# Patient Record
Sex: Female | Born: 1970
Health system: Southern US, Community
[De-identification: ages and names within clinical notes are randomized; demographics above are authoritative.]

## PROBLEM LIST (undated history)

## (undated) DIAGNOSIS — N800 Endometriosis of uterus: Secondary | ICD-10-CM

## (undated) DIAGNOSIS — N920 Excessive and frequent menstruation with regular cycle: Secondary | ICD-10-CM

## (undated) DIAGNOSIS — J069 Acute upper respiratory infection, unspecified: Secondary | ICD-10-CM

## (undated) DIAGNOSIS — K219 Gastro-esophageal reflux disease without esophagitis: Secondary | ICD-10-CM

## (undated) DIAGNOSIS — I00 Rheumatic fever without heart involvement: Secondary | ICD-10-CM

## (undated) DIAGNOSIS — E739 Lactose intolerance, unspecified: Secondary | ICD-10-CM

## (undated) DIAGNOSIS — Z87442 Personal history of urinary calculi: Secondary | ICD-10-CM

## (undated) DIAGNOSIS — K5909 Other constipation: Secondary | ICD-10-CM

## (undated) DIAGNOSIS — N8003 Adenomyosis of the uterus: Secondary | ICD-10-CM

## (undated) DIAGNOSIS — N3010 Interstitial cystitis (chronic) without hematuria: Secondary | ICD-10-CM

## (undated) DIAGNOSIS — G4730 Sleep apnea, unspecified: Secondary | ICD-10-CM

## (undated) DIAGNOSIS — N809 Endometriosis, unspecified: Secondary | ICD-10-CM

## (undated) DIAGNOSIS — Z9889 Other specified postprocedural states: Secondary | ICD-10-CM

## (undated) DIAGNOSIS — J45909 Unspecified asthma, uncomplicated: Secondary | ICD-10-CM

## (undated) DIAGNOSIS — R55 Syncope and collapse: Secondary | ICD-10-CM

## (undated) DIAGNOSIS — D649 Anemia, unspecified: Secondary | ICD-10-CM

## (undated) DIAGNOSIS — N946 Dysmenorrhea, unspecified: Secondary | ICD-10-CM

## (undated) DIAGNOSIS — I82409 Acute embolism and thrombosis of unspecified deep veins of unspecified lower extremity: Secondary | ICD-10-CM

## (undated) DIAGNOSIS — G43909 Migraine, unspecified, not intractable, without status migrainosus: Secondary | ICD-10-CM

## (undated) DIAGNOSIS — Z86018 Personal history of other benign neoplasm: Secondary | ICD-10-CM

## (undated) DIAGNOSIS — D219 Benign neoplasm of connective and other soft tissue, unspecified: Secondary | ICD-10-CM

## (undated) DIAGNOSIS — R197 Diarrhea, unspecified: Secondary | ICD-10-CM

## (undated) DIAGNOSIS — M549 Dorsalgia, unspecified: Secondary | ICD-10-CM

## (undated) DIAGNOSIS — K222 Esophageal obstruction: Secondary | ICD-10-CM

## (undated) DIAGNOSIS — N200 Calculus of kidney: Secondary | ICD-10-CM

## (undated) DIAGNOSIS — D75A Glucose-6-phosphate dehydrogenase (G6PD) deficiency without anemia: Secondary | ICD-10-CM

## (undated) DIAGNOSIS — M1990 Unspecified osteoarthritis, unspecified site: Secondary | ICD-10-CM

## (undated) DIAGNOSIS — K449 Diaphragmatic hernia without obstruction or gangrene: Secondary | ICD-10-CM

## (undated) DIAGNOSIS — K589 Irritable bowel syndrome without diarrhea: Secondary | ICD-10-CM

## (undated) DIAGNOSIS — N6089 Other benign mammary dysplasias of unspecified breast: Secondary | ICD-10-CM

## (undated) HISTORY — DX: Other benign mammary dysplasias of unspecified breast: N60.89

## (undated) HISTORY — PX: KNEE SURGERY: SHX244

## (undated) HISTORY — DX: Esophageal obstruction: K22.2

## (undated) HISTORY — DX: Anemia, unspecified: D64.9

## (undated) HISTORY — DX: Diarrhea, unspecified: R19.7

## (undated) HISTORY — DX: Unspecified asthma, uncomplicated: J45.909

## (undated) HISTORY — DX: Diaphragmatic hernia without obstruction or gangrene: K44.9

## (undated) HISTORY — DX: Adenomyosis of the uterus: N80.03

## (undated) HISTORY — DX: Personal history of other benign neoplasm: Z86.018

## (undated) HISTORY — DX: Glucose-6-phosphate dehydrogenase (G6PD) deficiency without anemia: D75.A

## (undated) HISTORY — PX: CHOLECYSTECTOMY: SHX55

## (undated) HISTORY — DX: Endometriosis, unspecified: N80.9

## (undated) HISTORY — DX: Sleep apnea, unspecified: G47.30

## (undated) HISTORY — PX: LAPAROSCOPY: SHX197

## (undated) HISTORY — DX: Excessive and frequent menstruation with regular cycle: N92.0

## (undated) HISTORY — DX: Endometriosis of uterus: N80.0

## (undated) HISTORY — DX: Dorsalgia, unspecified: M54.9

## (undated) HISTORY — DX: Interstitial cystitis (chronic) without hematuria: N30.10

## (undated) HISTORY — DX: Acute upper respiratory infection, unspecified: J06.9

## (undated) HISTORY — PX: ARTHROSCOPIC HAGLUNDS REPAIR: SHX5187

## (undated) HISTORY — DX: Benign neoplasm of connective and other soft tissue, unspecified: D21.9

## (undated) HISTORY — DX: Irritable bowel syndrome, unspecified: K58.9

## (undated) HISTORY — DX: Other constipation: K59.09

## (undated) HISTORY — DX: Unspecified osteoarthritis, unspecified site: M19.90

## (undated) HISTORY — DX: Lactose intolerance, unspecified: E73.9

## (undated) HISTORY — DX: Dysmenorrhea, unspecified: N94.6

---

## 1990-01-03 DIAGNOSIS — I82409 Acute embolism and thrombosis of unspecified deep veins of unspecified lower extremity: Secondary | ICD-10-CM | POA: Insufficient documentation

## 1997-10-23 ENCOUNTER — Other Ambulatory Visit: Admission: RE | Admit: 1997-10-23 | Discharge: 1997-10-23 | Payer: Self-pay | Admitting: Obstetrics and Gynecology

## 1998-01-05 ENCOUNTER — Encounter: Payer: Self-pay | Admitting: Emergency Medicine

## 1998-01-05 ENCOUNTER — Emergency Department (HOSPITAL_COMMUNITY): Admission: EM | Admit: 1998-01-05 | Discharge: 1998-01-05 | Payer: Self-pay | Admitting: Emergency Medicine

## 1999-03-01 ENCOUNTER — Encounter: Payer: Self-pay | Admitting: *Deleted

## 1999-03-01 ENCOUNTER — Ambulatory Visit (HOSPITAL_COMMUNITY): Admission: RE | Admit: 1999-03-01 | Discharge: 1999-03-01 | Payer: Self-pay | Admitting: *Deleted

## 1999-07-23 ENCOUNTER — Ambulatory Visit (HOSPITAL_COMMUNITY): Admission: RE | Admit: 1999-07-23 | Discharge: 1999-07-23 | Payer: Self-pay | Admitting: Emergency Medicine

## 2000-03-01 ENCOUNTER — Emergency Department (HOSPITAL_COMMUNITY): Admission: EM | Admit: 2000-03-01 | Discharge: 2000-03-01 | Payer: Self-pay | Admitting: Emergency Medicine

## 2000-04-22 ENCOUNTER — Encounter (INDEPENDENT_AMBULATORY_CARE_PROVIDER_SITE_OTHER): Payer: Self-pay | Admitting: Specialist

## 2000-04-22 ENCOUNTER — Encounter: Payer: Self-pay | Admitting: Emergency Medicine

## 2000-04-22 ENCOUNTER — Inpatient Hospital Stay (HOSPITAL_COMMUNITY): Admission: EM | Admit: 2000-04-22 | Discharge: 2000-04-25 | Payer: Self-pay | Admitting: Emergency Medicine

## 2000-04-23 ENCOUNTER — Encounter: Payer: Self-pay | Admitting: Emergency Medicine

## 2000-04-24 ENCOUNTER — Encounter: Payer: Self-pay | Admitting: Surgery

## 2000-09-09 ENCOUNTER — Encounter: Payer: Self-pay | Admitting: Emergency Medicine

## 2000-09-09 ENCOUNTER — Emergency Department (HOSPITAL_COMMUNITY): Admission: EM | Admit: 2000-09-09 | Discharge: 2000-09-09 | Payer: Self-pay | Admitting: Emergency Medicine

## 2000-09-13 ENCOUNTER — Encounter: Payer: Self-pay | Admitting: Emergency Medicine

## 2000-09-13 ENCOUNTER — Encounter: Admission: RE | Admit: 2000-09-13 | Discharge: 2000-09-13 | Payer: Self-pay | Admitting: Emergency Medicine

## 2000-10-17 ENCOUNTER — Ambulatory Visit (HOSPITAL_COMMUNITY): Admission: RE | Admit: 2000-10-17 | Discharge: 2000-10-17 | Payer: Self-pay | Admitting: Gastroenterology

## 2001-01-27 ENCOUNTER — Emergency Department (HOSPITAL_COMMUNITY): Admission: EM | Admit: 2001-01-27 | Discharge: 2001-01-27 | Payer: Self-pay | Admitting: *Deleted

## 2001-02-01 ENCOUNTER — Encounter: Payer: Self-pay | Admitting: Emergency Medicine

## 2001-02-01 ENCOUNTER — Encounter: Admission: RE | Admit: 2001-02-01 | Discharge: 2001-02-01 | Payer: Self-pay | Admitting: Emergency Medicine

## 2001-02-09 ENCOUNTER — Ambulatory Visit (HOSPITAL_COMMUNITY): Admission: RE | Admit: 2001-02-09 | Discharge: 2001-02-09 | Payer: Self-pay | Admitting: Gastroenterology

## 2001-02-12 ENCOUNTER — Other Ambulatory Visit: Admission: RE | Admit: 2001-02-12 | Discharge: 2001-02-12 | Payer: Self-pay | Admitting: Obstetrics and Gynecology

## 2001-02-14 ENCOUNTER — Ambulatory Visit (HOSPITAL_COMMUNITY): Admission: RE | Admit: 2001-02-14 | Discharge: 2001-02-14 | Payer: Self-pay | Admitting: Obstetrics and Gynecology

## 2001-02-14 ENCOUNTER — Encounter (INDEPENDENT_AMBULATORY_CARE_PROVIDER_SITE_OTHER): Payer: Self-pay | Admitting: Specialist

## 2001-09-06 ENCOUNTER — Encounter: Payer: Self-pay | Admitting: Emergency Medicine

## 2001-09-06 ENCOUNTER — Emergency Department (HOSPITAL_COMMUNITY): Admission: EM | Admit: 2001-09-06 | Discharge: 2001-09-06 | Payer: Self-pay | Admitting: Emergency Medicine

## 2001-09-08 ENCOUNTER — Emergency Department (HOSPITAL_COMMUNITY): Admission: EM | Admit: 2001-09-08 | Discharge: 2001-09-08 | Payer: Self-pay | Admitting: Emergency Medicine

## 2002-02-21 ENCOUNTER — Other Ambulatory Visit: Admission: RE | Admit: 2002-02-21 | Discharge: 2002-02-21 | Payer: Self-pay | Admitting: Obstetrics and Gynecology

## 2003-06-03 ENCOUNTER — Other Ambulatory Visit: Admission: RE | Admit: 2003-06-03 | Discharge: 2003-06-03 | Payer: Self-pay | Admitting: Obstetrics and Gynecology

## 2004-06-29 ENCOUNTER — Other Ambulatory Visit: Admission: RE | Admit: 2004-06-29 | Discharge: 2004-06-29 | Payer: Self-pay | Admitting: Obstetrics and Gynecology

## 2004-12-28 ENCOUNTER — Emergency Department (HOSPITAL_COMMUNITY): Admission: EM | Admit: 2004-12-28 | Discharge: 2004-12-28 | Payer: Self-pay | Admitting: Family Medicine

## 2005-10-04 ENCOUNTER — Other Ambulatory Visit: Admission: RE | Admit: 2005-10-04 | Discharge: 2005-10-04 | Payer: Self-pay | Admitting: Obstetrics and Gynecology

## 2006-08-22 ENCOUNTER — Encounter: Admission: RE | Admit: 2006-08-22 | Discharge: 2006-08-22 | Payer: Self-pay | Admitting: Emergency Medicine

## 2008-01-02 ENCOUNTER — Ambulatory Visit (HOSPITAL_COMMUNITY): Admission: RE | Admit: 2008-01-02 | Discharge: 2008-01-02 | Payer: Self-pay | Admitting: Emergency Medicine

## 2008-01-02 ENCOUNTER — Encounter (INDEPENDENT_AMBULATORY_CARE_PROVIDER_SITE_OTHER): Payer: Self-pay | Admitting: Emergency Medicine

## 2008-01-02 ENCOUNTER — Ambulatory Visit: Payer: Self-pay | Admitting: Vascular Surgery

## 2008-01-04 HISTORY — PX: VAGINAL HYSTERECTOMY: SUR661

## 2008-01-07 ENCOUNTER — Encounter: Admission: RE | Admit: 2008-01-07 | Discharge: 2008-01-07 | Payer: Self-pay | Admitting: Emergency Medicine

## 2008-04-01 ENCOUNTER — Ambulatory Visit (HOSPITAL_COMMUNITY): Admission: RE | Admit: 2008-04-01 | Discharge: 2008-04-02 | Payer: Self-pay | Admitting: Obstetrics and Gynecology

## 2008-04-01 ENCOUNTER — Encounter (INDEPENDENT_AMBULATORY_CARE_PROVIDER_SITE_OTHER): Payer: Self-pay | Admitting: Obstetrics and Gynecology

## 2009-06-14 ENCOUNTER — Emergency Department (HOSPITAL_COMMUNITY): Admission: EM | Admit: 2009-06-14 | Discharge: 2009-06-14 | Payer: Self-pay | Admitting: Emergency Medicine

## 2009-06-23 ENCOUNTER — Encounter: Admission: RE | Admit: 2009-06-23 | Discharge: 2009-06-23 | Payer: Self-pay | Admitting: Internal Medicine

## 2009-07-01 ENCOUNTER — Other Ambulatory Visit: Admission: RE | Admit: 2009-07-01 | Discharge: 2009-07-01 | Payer: Self-pay | Admitting: Diagnostic Radiology

## 2009-07-01 ENCOUNTER — Encounter: Admission: RE | Admit: 2009-07-01 | Discharge: 2009-07-01 | Payer: Self-pay | Admitting: Internal Medicine

## 2010-01-24 ENCOUNTER — Encounter: Payer: Self-pay | Admitting: Emergency Medicine

## 2010-03-22 ENCOUNTER — Other Ambulatory Visit: Payer: Self-pay | Admitting: Obstetrics and Gynecology

## 2010-03-22 DIAGNOSIS — Z1231 Encounter for screening mammogram for malignant neoplasm of breast: Secondary | ICD-10-CM

## 2010-03-29 ENCOUNTER — Ambulatory Visit
Admission: RE | Admit: 2010-03-29 | Discharge: 2010-03-29 | Disposition: A | Discharge status: Home / Self Care | Payer: BC Managed Care – PPO | Source: Ambulatory Visit | Attending: Obstetrics and Gynecology | Admitting: Obstetrics and Gynecology

## 2010-03-29 DIAGNOSIS — Z1231 Encounter for screening mammogram for malignant neoplasm of breast: Secondary | ICD-10-CM

## 2010-04-15 LAB — URINALYSIS, ROUTINE W REFLEX MICROSCOPIC
Bilirubin Urine: NEGATIVE
Ketones, ur: NEGATIVE mg/dL
Protein, ur: NEGATIVE mg/dL
Specific Gravity, Urine: >1.03 — ABNORMAL HIGH (ref 1.005–1.030)
Urobilinogen, UA: 0.2 mg/dL (ref 0.0–1.0)
pH: 5 (ref 5.0–8.0)

## 2010-04-15 LAB — CBC
HCT: 31.1 % — ABNORMAL LOW (ref 36.0–46.0)
MCHC: 33.3 g/dL (ref 30.0–36.0)
MCHC: 33.6 g/dL (ref 30.0–36.0)
MCV: 96.1 fL (ref 78.0–100.0)
MCV: 97.2 fL (ref 78.0–100.0)
Platelets: 163 10*3/uL (ref 150–400)
Platelets: 197 10*3/uL (ref 150–400)
RDW: 14 % (ref 11.5–15.5)
RDW: 14.3 % (ref 11.5–15.5)

## 2010-04-30 ENCOUNTER — Other Ambulatory Visit: Payer: Self-pay | Admitting: Gastroenterology

## 2010-05-06 ENCOUNTER — Ambulatory Visit
Admission: RE | Admit: 2010-05-06 | Discharge: 2010-05-06 | Disposition: A | Discharge status: Home / Self Care | Payer: BC Managed Care – PPO | Source: Ambulatory Visit | Attending: Gastroenterology | Admitting: Gastroenterology

## 2010-05-18 NOTE — Op Note (Signed)
NAMEJAZMAN, Karen [name]              ACCOUNT NO.:  [id number]   MEDICAL RECORD NO.:  [id number]          PATIENT TYPE:  OIB   LOCATION:  9309                          FACILITY:  [facility]   PHYSICIAN:  Janine Limbo, M.D.DATE OF BIRTH:  1970-11-24   DATE OF PROCEDURE:  04/01/2008  DATE OF DISCHARGE:                               OPERATIVE REPORT   PREOPERATIVE DIAGNOSES:  1. Pelvic pain.  2. Endometriosis.  3. Fibroid uterus.  4. Obesity (weight 90 kg).   POSTOPERATIVE DIAGNOSES:  1. Pelvic pain.  2. Endometriosis.  3. Fibroid uterus.  4. Obesity (weight 90 kg).  5. Congested pelvic vessels.   PROCEDURES:  1. Diagnostic laparoscopy.  2. Laparoscopic pelvic biopsies.  3. Vaginal hysterectomy.   SURGEON:  Janine Limbo, M.D.   FIRST ASSISTANT:  Osborn Coho, M.D.   ANESTHETIC:  General.   DISPOSITION:  Karen [name] is a 40 year old female who presents with the  above-mentioned diagnosis.  Hormonal therapy and pain medications have  not relieved her discomfort.  The patient has had to diagnostic  laparoscopies in the past and endometriosis was diagnosed.  At this  point, she is ready to proceed with definitive therapy.  She understands  indications for her surgical procedure and she accepts the risks of, but  not limited to, anesthetic complications, bleeding, infections, and  possible damage to the surrounding organs.  She understands that no  guarantees can be given concerning the relief of her pelvic discomfort.   FINDINGS:  The uterus was approximately 8-10 weeks size and was  irregular.  She was noted to have several fibroids on the fundus that  were subserosal and that measured less than 2 cm in size.  The fallopian  tubes and ovaries appeared normal.  There was a hyperpigmented lesion on  the right pelvic sidewall in the posterior cul-de-sac, that was  consistent with endometriosis.  A biopsy was obtained from this area.  The appendix, and the liver appeared  normal.  The patient has had a  cholecystectomy in the past.  There was no evidence of endometriosis in  the anterior cul-de-sac.  There is no evidence of endometriosis in the  posterior cul-de-sac.  We were able to review the peritoneal surfaces  and there was no evidence of endometriosis along the peritoneal  surfaces.   DESCRIPTION OF PROCEDURE:  The patient was taken to the operating room  where a general anesthetic was given.  The patient's abdomen, perineum,  and vagina were prepped with multiple layers of Betadine.  A Foley  catheter was placed in the bladder.  Examination under anesthesia was  performed.  A Hulka tenaculum was placed inside the uterus.  The patient  was then sterilely draped.  The subumbilical area was injected with 4 mL  of 0.5%  Marcaine with epinephrine.  A subumbilical incision was made  and carried sharply through the subcutaneous tissue, fascia, and the  anterior peritoneum.  The Hulka tenaculum was sutured into place using 0  Vicryl.  The laparoscope was inserted.  A pneumoperitoneum was obtained.  The pelvic organs were visualized with findings  as mentioned above.  The  lower abdomen was injected with a total of 6 mL of 0.5% Marcaine with  epinephrine on 2 separate sites.  Two 5 mm trocars were placed under  direct visualization into the lower abdomen.  The pelvis was carefully  inspected.  Pictures were taken.  The area of endometriosis mentioned  above was biopsied.  Hemostasis was noted to be adequate.  Care was  taken not to damage any of the vital structures along the pelvic  sidewall.  At this point, we felt that it was appropriate to proceed  with vaginal hysterectomy.  The patient was placed in a more lithotomy  position.  The cervix was injected with a total of 25 mL of a diluted  solution of Pitressin and saline.  A circumferential incision was made  around the cervix and the vaginal mucosa was advanced anteriorly and  posteriorly.  The anterior  cul-de-sac and the posterior cul-de-sacs were  sharply entered.  Alternating from left-to-right the uterosacral  ligaments, paracervical tissues, parametrial tissues, and uterine  arteries were clamped, cut, sutured, and tied securely.  The uterus was  then inverted through the posterior colpotomy.  The upper pedicles were  secured and the uterus was transected from the operative field.  The  upper pedicles were then suture ligated using 2 ties of 0 Vicryl.  Hemostasis was noted to be adequate.  The sutures attached to the  uterosacral ligaments were brought out through the vaginal angles and  tied securely.  A McCall culdoplasty suture was placed in the posterior  cul-de-sac incorporating the uterosacral ligaments bilaterally.  A final  check was made for hemostasis and hemostasis was again confirmed.  The  vaginal cuff was closed using figure-of-eight sutures incorporating the  anterior vaginal mucosa, the anterior peritoneum, the posterior  peritoneum, and the posterior vaginal mucosa.  The McCall culdoplasty  suture was tied securely and the apex of the vagina was noted to elevate  into the mid pelvis.  The operator then changed gown and gloves.  The  pneumoperitoneum was reestablished.  The pelvic structures and abdominal  structures were carefully inspected.  Hemostasis was noted to be  adequate.  The pelvis was irrigated.  Again hemostasis was confirmed.  The vital structures were identified along the pelvic sidewall and there  was no evidence of damage to these vital structures.  The bowel was  carefully inspected and there was no evidence of damage to the bowel.  The lower abdominal trocars were removed under direct visualization.  Hemostasis was confirmed.  The pneumoperitoneum was allowed to escape  and again hemostasis was confirmed.  The Hasson cannula was removed.  The subumbilical fascia was closed using figure-of-eight sutures of 0  Vicryl.  The skin from the subumbilical  area was closed using 3-0  Monocryl.  The skin from the lower abdominal incisions were closed using  3-0 Monocryl.  Sponge, needle, and instrument counts were correct on 2  occasions.  The estimated blood loss  for the procedure was 200 mL.  The patient tolerated her procedure well.  She was transported to the recovery room in stable condition.  The  uterus and the biopsy from the right posterior cul-de-sac were sent to  Pathology for evaluation.  The patient was noted to drain clear yellow  urine at the end of her procedure.      Janine Limbo, M.D.  Electronically Signed     AVS/MEDQ  D:  04/01/2008  T:  04/02/2008  Job:  [id number]   cc:   Reuben Likes, M.D.  Fax: [phone]   Llana Aliment. Malon Kindle., M.D.  Fax: [phone]   [facility]

## 2010-05-21 NOTE — Op Note (Signed)
[facility]  Patient:    ALNITA, AYBAR Visit Number: [id number] MRN: [id number]          Service Type: DSU Location: [facility] Attending Physician:  Leonard Schwartz Dictated by:   Janine Limbo, M.D. Proc. Date: 02/14/01 Admit Date:  02/14/2001   CC:         Reuben Likes, M.D.  Mardene Celeste Lurene Shadow, M.D.   Operative Report  PREOPERATIVE DIAGNOSIS:       Pelvic pain.  POSTOPERATIVE DIAGNOSES:      1. Pelvic pain.                               2. Endometriosis.  PROCEDURES:                   1. Diagnostic laparoscopy.                               2. Laparoscopic pelvic biopsies.                               3. Laparoscopic ablation of endometriosis.  SURGEON:                      Janine Limbo, M.D.  ANESTHESIA:                   General.  INDICATIONS:                  The patient is a 40 year old female who presents with a known history of endometriosis.  She has had increasingly severe pelvic discomfort.  Her GI evaluation has not determined the etiology of her discomfort.  The patient is status post cholecystectomy.  The patient understands the indications for her surgical procedure and she accepts the risks of (but not limited to) anesthetic complications, bleeding, infections and possible damage to the surrounding organs.  The patient understands that no guarantees can be given concerning the total relief of her discomfort.  FINDINGS:                     The uterus was upper limits of normal size.  The fallopian tubes were normal.  The ovaries were normal except there were hyperpigmented areas on the surface of the ovary (right greater than left) that was thought to be consistent with endometriosis.  There was a hyperpigmented area in the posterior cul-de-sac measuring approximately 2 x 1 cm that was thought to be consistent with endometriosis.  There was a hyperpigmented area on the left uterosacral ligament  measuring 1 cm x 1 cm that was thought to be consistent with endometriosis.  There was a hyperpigmented area in the posterior right cul-de-sac measuring approximately 1 x 2 cm that was thought to be consistent with endometriosis.  The appendix appeared normal.  The liver appeared normal except that there was no gallbladder present.  The bowel appeared normal.  The anterior cul-de-sac appeared normal.  The posterior cul-de-sac was normal except for the endometriosis areas mentioned above.  DESCRIPTION OF PROCEDURE:     The patient was taken to the operating room, where a general anesthetic was given.  The patients abdomen, perineum and vagina were prepped with multiple layers of Betadine.  Examination under anesthesia was performed.  A Foley catheter was placed in the bladder.  A Hulka tenaculum was placed inside the uterus.  The patient was sterilely draped.  The subumbilical area was injected with 5 cc of 0.5% Marcaine.  An incision was made and a Hasson cannula was secured in place after the layers were traversed sharply.  The abdominal cavity was filled.  The laparoscope was inserted and the pelvic structures were identified.  The suprapubic area was injected with 3 cc of 0.5% Marcaine.  An incision was made and a 5 mm trocar was inserted under direct visualization.  Pictures were taken of the patients pelvic and abdominal anatomy.  The hyperpigmented areas in the posterior cul-de-sac were injected with lactated Ringers.  Biopsies were obtained from the posterior cul-de-sac.  The areas were then ablated using a harmonic scalpel.  We attempted to obtain a biopsy from the right ovary, but this was unsuccessful because we were not able to adequately stabilize the right ovary.  We nevertheless did ablate some of the endometriosis areas (all areas thought to be appropriate).  The anterior cul-de-sac was free and, therefore, no surgery was thought to be needed in this area.  After carefully  ablating the areas of endometriosis, we then irrigated the pelvis.  Hemostasis was adequate.  Care had been taken not to damage any of the vital underlying structures.  After the pelvis was again irrigated, the decision was made to end the procedure because there were no other areas of endometriosis that needed to be treated.  All instruments were removed.  The subumbilical port was closed using 0 Vicryl in the fascia and then 4-0 Vicryl in the subcutaneous layer.  The skin was reapproximated using skin staples.  Sponge, needle and instrument counts were correct on two occasions.  Estimated blood loss was 10 cc.  The patient tolerated her procedure well.  She was awakened from her anesthetic and taken to the recovery room in stable condition. Estimated blood loss was 10 cc.  FOLLOW-UP INSTRUCTIONS:       The patient was given Percocet, 1-2 p.o. q.4h. p.r.n. pain.  She will return in 2-3 weeks for follow-up examination.  She will call for questions or concerns.  She was given a copy of the postoperative instruction sheet as prepared by the [facility] for patients who have undergone laparoscopy. Dictated by:   Janine Limbo, M.D. Attending Physician:  Leonard Schwartz DD:  02/14/01 TD:  02/14/01 Job: [id number]

## 2010-05-21 NOTE — Discharge Summary (Signed)
NAMEMARICARMEN, [name]              ACCOUNT NO.:  [id number]   MEDICAL RECORD NO.:  [id number]          PATIENT TYPE:  OIB   LOCATION:  9309                          FACILITY:  [facility]   PHYSICIAN:  Janine Limbo, M.D.DATE OF BIRTH:  [birth year]   DATE OF ADMISSION:  04/01/2008  DATE OF DISCHARGE:  04/02/2008                               DISCHARGE SUMMARY   ADMISSION DIAGNOSES:  1. Pelvic pain.  2. Endometriosis.  3. Obesity (weight equals 90 kg).  4. Fibroid uterus.   POSTOPERATIVE DIAGNOSES:  1. Pelvic pain.  2. Endometriosis.  3. Obesity (weight equals 90 kg).  4. Fibroid uterus.  5. Pelvic vessel congestion.  6. Postoperative anemia (hemoglobin 10.4).   PROCEDURES:  This admission, April 01, 2008:  1. Laparoscopy.  2. Laparoscopic pelvic biopsies.  3. Vaginal hysterectomy.   HISTORY OF PRESENT ILLNESS:  Karen [name] is a 40 year old female, para 1-  1-0-2, who presents for laparoscopy-assisted vaginal hysterectomy  because of persistent pelvic pain and endometriosis.  Multiple therapies  have been employed including pain medications and hormonal therapy.  She  has had discomfort for many years and wishes to proceed with definitive  therapy at this time.   OBSTETRICAL HISTORY:  The patient has had 1 cesarean delivery and 1  vaginal delivery.   DRUG ALLERGIES:  No known drug allergies.   PAST MEDICAL HISTORY:  The patient has a history of seasonal allergies.  She has a history of urinary tract infections.  She is status post  cholecystectomy.   SOCIAL HISTORY:  The patient denies cigarette use, alcohol use, and  recreational drug use.   REVIEW OF SYSTEMS:  The patient has dysmenorrhea and seasonal allergies.   FAMILY HISTORY:  Noncontributory.   ADMISSION PHYSICAL EXAMINATION:  HEENT:  Within normal limits.  CHEST:  Clear.  HEART:  Regular rate and rhythm.  ABDOMEN:  Soft and nontender.  BREASTS:  Without masses.  EXTREMITIES:  Within normal limits.   NEUROLOGIC:  Grossly normal.  PELVIC:  External genitalia is normal.  Vagina is normal.  Cervix is  normal.  Uterus is upper limits of normal size and nontender.  ADNEXA:  No masses and rectovaginal exam confirms.   HOSPITAL COURSE:  On the day of admission, the patient was taken to the  operating room where a diagnostic laparoscopy was performed.  Operative  findings included an 8-10 weeks size uterus with a 2-cm fibroid.  There  were hyperpigmented lesions in the right posterior cul-de-sac that were  consistent with endometriosis.  The patient then had biopsy of those  hyperpigmented lesions followed by a vaginal hysterectomy.  The patient  tolerated her procedure well.  There were no complications.  Her  postoperative course was uneventful.  She quickly tolerated a regular  diet.  She voided without difficulty prior to discharge.  Her  postoperative hemoglobin was 10.4 (preoperative hemoglobin 12.7).  She  was felt to be ready for discharge on postoperative day #1, having made  adequate recovery.   DISCHARGE MEDICATIONS:  1. Ibuprofen 800 mg every 8 hours as needed for mild-to-moderate pain.  2. Percocet  5/325, 1-2 tablets every 4 hours as needed for severe      pain.  3. Zofran 8 mg every 8 hours as needed for nausea.  4. Iron 325 mg twice each day for 6 weeks.  5. Gas-X as needed.  6. Mylanta as needed.  7. Colace 1 tablet twice each day.   DISCHARGE INSTRUCTIONS:  The patient was given a copy of the  postoperative instruction sheet as prepared by the [employer] [facility]  Division of [facility] for women.  She will call for problems  or concerns.  She was told to refrain from driving for 1 week, heavy  lifting for 2 weeks.   FINAL PATHOLOGY REPORT:  1. Posterior cul-de-sac biopsy consistent with endometriosis.  2. A 178 g uterus measuring 11 x 8 x 7 cm.  Benign fibroid was      present.  No other pathology was appreciated on the uterus.      Janine Limbo,  M.D.  Electronically Signed     AVS/MEDQ  D:  04/15/2008  T:  04/16/2008  Job:  [id number]

## 2010-05-21 NOTE — H&P (Signed)
[place]  Patient:    Karen [name], Karen [name]                       MRN: [id number] Adm. Date:  [id number] Attending:  Roque Lias                         History and Physical  CHIEF COMPLAINT:  "Abdominal pain."  HISTORY OF PRESENT ILLNESS:  The patient is a 40 year old female who experienced onset of severe epigastric pain radiating through to the back yesterday morning.  The pain is worse today associated with nausea and vomiting.  She denies hematemesis or bilious vomiting.  She has not had any fever, chills, diarrhea, or blood in her stool.  She was found to have gallstones during her last pregnancy.  She has not had any similar attacks since then.  PAST SURGICAL HISTORY:  The patient had a cesarean section in 1995 and arthroscopic surgery on her left knee in October 2001.  PAST MEDICAL HISTORY:  The patient was hospitalized in 1992 with thrombophlebitis, in 1992 with an episode of syncope of unknown cause, and in 1996 with bronchitis.  Other illnesses:  She sees Dr. Joseph Art for acne.  Dr. Stefano Gaul is her OB/GYN.  Has allergic rhinitis.  MEDICATIONS:  Allegra 180 mg q.d.  ALLERGIES:  No known drug allergies.  FAMILY HISTORY:  Her father died at age 79 of multiple myeloma.  Her mother is alive and well.  She has two brothers and one sister, all in good health and two children in good health.  REVIEW OF SYSTEMS:  She gets frequent headaches.  No other systemic skin, eye, ENT, respiratory, cardiovascular, GI, GU, musculoskeletal, or neurological complaints.  PHYSICAL EXAMINATION  VITAL SIGNS:  Blood pressure 99/65, pulse 76 and regular, respirations 22, temperature 97.4.  GENERAL:  The patient is moaning and wincing in pain and has occasional emesis.  SKIN:  Warm and dry.  No rash.  HEENT:  Eyes:  Pupils are equal, round and reactive to light.  Full EOMs. Fundi:  Benign.  Sclerae:  Non-icteric.  TMs:  Normal.  No intraoral  lesions. Pharynx:  Clear.  Mucous membranes moist.  NECK:  Supple.  No adenopathy, JVD, bruit.  Thyroid normal.  LUNGS:  Clear to auscultation and percussion.  HEART:  Regular rhythm.  No gallop or murmur.  ABDOMEN:  There is marked upper abdominal tenderness to palpation, right greater than left with guarding.  No organomegaly or mass.  Bowel sounds were absent.  She had positive Murphys sign and punch.  RECTAL:  No masses.  Heme negative stool.  EXTREMITIES:  No edema.  Pulses full.  NEUROLOGIC:  Alert, oriented x 3.  Speech was clear and appropriate.  No extremity weakness or tremor.  DTRs 2+ and symmetrical.  Babinskis downgoing. Cranial nerves intact.  IMPRESSION:  Abdominal pain, probable acute cholecystitis.  PLAN: 1. IV fluids and IV pain medications. 2. CBC, CMET, amylase, lipase, abdominal ultrasound. 3. Surgical consult. DD:  04/22/00 TD:  04/24/00 Job: [id number]

## 2010-05-21 NOTE — Consult Note (Signed)
[place]  Patient:    Karen [name], Karen [name]                       MRN: [id number] Proc. Date: 04/22/00 Adm. Date:  [id number] Disc. Date: [id number] Attending:  Annamarie Dawley CC:         Reuben Likes, M.D.  Mardene Celeste Lurene Shadow, M.D.   Consultation Report  DATE OF BIRTH:  1970-11-27  HISTORY OF PRESENT ILLNESS:  The patient is a 40 year old black female who is a patient of Dr. Leslee Home and has been seen before by Dr. Leonie Man, who comes with right upper quadrant epigastric abdominal pain.  She had food poisoning or what she describes as food poisoning of about weeks, but she had kind of different symptoms.  It took about two weeks for her to get over this issue, but this resolved.  She has known gallstones from three years ago when her youngest child when she was present, but after her pregnancy, she has had no further symptoms.  Has seen Dr. Lurene Shadow at least one time.  The decision was made to just observe her.  Her father had gallstones and had them removed.  She has had no history of peptic ulcer disease, liver disease, pancreatic disease, change in bowel habits.  Her only prior abdominal surgery was a cesarean section for her oldest in 32.  She denies any history of colitis, and except for the "food poisoning" two weeks ago, she has really had no GI symptoms over the last couple years.  PAST MEDICAL HISTORY:  She has no allergies.  MEDICATIONS:  Her only medication includes Allegra and multivitamins.  REVIEW OF SYSTEMS:  PULMONARY:  No pneumonia or tuberculosis.  CARDIAC: Negative for heart disease, chest pain, or hypertension.  GASTROINTESTINAL: See History of Present Illness.  UROLOGIC:  No kidney stones or kidney infections.  GYN:  She is a gravida 2, para 2.  Her last menstrual period was March 23, 2000, and her periods are fairly regular.  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  PHYSICAL EXAMINATION:  VITAL SIGNS:  Temperature is 97.4, blood pressure 99/65, pulse 76, respirations 22.  GENERAL:  She is a well-nourished pleasant black female, alert and cooperative on physical exam.  HEENT:  Unremarkable.  NECK:  Supple without mass and without thyromegaly and without lymphadenopathy.  LUNGS:  Clear to auscultation.  HEART:  Regular rate and rhythm without murmur or rub.  BREASTS:  I did not examine her breasts.  She has had a mammogram within the last year, she said for some lump in the breast which has all been determined to be benign.  ABDOMEN:  She has epigastric and right upper quadrant tenderness and soreness with maybe some very mild guarding.  She has no rebound.  Her bowel sounds are present, but decreased.  She has no abdominal mass and no hernia.  She has a well-healed Pfannenstiel incision.  EXTREMITIES:  She has good strength in all four extremities.  NEUROLOGIC:  Grossly intact.  LABORATORY DATA:  The labs that I have, white blood count is 5300 with 79% neutrophils.  She has a hemoglobin of 13.7, hematocrit of 41.2.  Her urine pregnancy test is negative.  Her other labs are pending.  Though she had an ultrasound three years ago which documented gallstones, we are repeating that now,  and pending at this time.  IMPRESSION:  Symptomatic gallstones.  The patient has been medicated which has limited her exam somewhat.  She is going to be admitted by Dr. Lorenz Coaster and if there is no other obvious reason for her abdominal pain, she will probably be best served having her cholecystectomy within 24-48 hours.  I discussed with her the indications for gallbladder surgery, the possibility of open surgery potential complications including bleeding, infection, bile duct and bowel injury.  I think she understands this well and we want to go ahead with surgery, and there is no other obvious cause for her abdominal  pain found.  At this time, I am covering for Dr. Leonie Man and Dr. Joanne Gavel, but will discuss with Dr. Wiliam Ke when he gets back into town regarding the timing of the surgery. DD:  04/22/00 TD:  04/22/00 Job: [id number]

## 2010-05-21 NOTE — Procedures (Signed)
[place]. [facility]  Patient:    Karen [name], Karen [name]                       MRN: [id number] Proc. Date: 04/24/00 Adm. Date:  [id number] Attending:  Roque Lias CC:         Sandria Bales. Ezzard Standing, M.D.  Reuben Likes, M.D.   Procedure Report  PROCEDURE:  Endoscopic retrograde cholangiography with biliary sphincterotomy and common bile duct stone extraction.  INDICATION:  Retained common bile duct stone post cholecystectomy.  HISTORY:  This is a 40 year old female who presented to the hospital with symptomatic cholelithiasis.  She underwent laparoscopic cholecystectomy yesterday.  Several stones were removed intraoperatively.  However, several stone were retained, including one which appeared impacted distally.  She was evaluated in consultation for consideration of ERCP.  She was deemed an appropriate candidate.  The nature of the procedure as well as the risks, benefits, and alternatives were reviewed.  She understood and agreed to proceed.  PHYSICAL EXAMINATION:  GENERAL:  A slightly uncomfortable-appearing female in no acute distress.  She is alert and oriented.  VITAL SIGNS:  Stable.  LUNGS:  Clear.  HEART:  Regular.  ABDOMEN:  Soft with mild tenderness throughout.  DESCRIPTION OF PROCEDURE:  After informed consent was obtained, patient was sedated with 100 mcg of fentanyl and 10 mg of Versed IV.  Glucagon 0.5 mg IV was given as a duodenal relaxant.  The Olympus side-viewing endoscope was then passed blindly into the esophagus.  The stomach and duodenal bulb were not examined.  The postbulbar duodenum revealed slightly bulging ampulla with a cholesterol-type stone protruding from the ampullary os.  Scout radiograph of the abdomen with the endoscope in position was taken.  Clips in the gallbladder fossa were noted.  No other abnormalities.  The common bile duct was then selectively and deeply cannulated.  Complete filling of the  biliary tree revealed small filling defects distally.  A biliary sphincterotomy was performed using a cutting cannula.  Sphincterotomy was performed over a hydrophilic wire, which was placed in the proximal biliary tree.  Cutting was carried out in the 12 oclock orientation with the ERBE system.  The sphincterotomy size was deemed moderate to large.  A 12 mm balloon was pulled through the biliary system several times after it was exchanged for the cutting catheter.  Three or four small filling defects measuring approximately 3-4 mm were extracted.  These were consistent with cholesterol stones.  No attempt for filling of the pancreatic duct occurred.  Post-extraction occlusion cholangiogram revealed no residual filling defects, with excellent drainage.  IMPRESSION:  Choledocholithiasis post cholecystectomy, status post ERC with biliary sphincterotomy and stone extraction.  RECOMMENDATIONS: 1. Continue antibiotics overnight. 2. Advance diet as tolerated. 3. Recheck liver function tests in a.m. 4. The results discussed with Dr. Ezzard Standing as well as the patients husband by    telephone. DD:  04/24/00 TD:  04/25/00 Job: [id number]

## 2010-05-21 NOTE — Discharge Summary (Signed)
[place]  Patient:    Karen [name], Karen [name]                       MRN: [id number] Adm. Date:  [id number] Disc. Date: [id number] Attending:  Andre Lefort                           Discharge Summary  DATE OF BIRTH:  1970/05/03.  DISCHARGE DIAGNOSES: 1. Subacute cholecystitis with cholelithiasis. 2. Choledocholithiasis.  OPERATIONS PERFORMED:  The patient had a laparoscopic cholecystectomy with a common bile duct exploration on April 23, 2000, and then the patient had an ERCP sphincterotomy on April 24, 2000.  HISTORY OF PRESENT ILLNESS:  Ms. Dalporto is a 40 year old black female patient under Reuben Likes, M.D., who had a history of gallstones evaluated by Luisa Hart L. Lurene Shadow, M.D., about three years before.  Because she had no symptoms, there was nothing further done with her gallstones at that time. She then presented on April 21, 2000, with increasing abdominal pain which would not improve with ibuprofen or Pepto-Bismol.  She presented to the [facility] Emergency Room on April 22, 2000.  PAST MEDICAL HISTORY:  Noncontributory.  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  PHYSICAL EXAMINATION:  Temperature 97.4, pulse 76.  She had epigastric right upper quadrant tenderness but had no rebound.  Her white blood cell count was 5300.  She was admitted with plans for preceding with a laparoscopic cholecystectomy. She was noted to have elevated liver functions.  It is unclear whether these were due to acute cholecystitis or common bile duct stones and she was taken to the operating room. She underwent a laparoscopic cholecystectomy where she was noted to have subacute cholecystitis and intraoperative cholangiogram where she was noted to have common bile duct stones.  I did a common bile duct exploration and was able to retrieve about two or three of her common bile duct stones but could not clear her common bile  duct.  Postoperatively, I asked Judie Petit T. Pleas Koch., M.D., to see her and then on April 24, 2000, Wilhemina Bonito. Eda Keys., M.D., did an ERCP with sphincterotomy retrieving her remaining common bile duct stones.  By the second postoperative day after the laparoscopic cholecystectomy, the first day after her ERCP, her bilirubin had dropped down to 6.4 and her liver functions were _________ normal, the patient felt pretty good and was ready for discharge.  DISCHARGE INSTRUCTIONS: 1. Vicodin for pain. 2. She is to do no heavy lifting for three or four days. 3. Stay on a low fat diet. 4. She was going to see me back in seven to 10 days for follow-up.  Call for    any problems.  I could not find a final pathology report on her chart regarding her gallbladder.  CONDITION ON DISCHARGE:  Good. DD:  05/07/00 TD:  05/08/00 Job: [id number]

## 2010-05-21 NOTE — Op Note (Signed)
[place]. [facility]  Patient:    Karen [name], Karen [name]                       MRN: [id number] Proc. Date: 04/23/00 Adm. Date:  [id number] Attending:  Roque Lias                           Operative Report  DATE OF BIRTH:  19-Jan-1970  PREOPERATIVE DIAGNOSIS:  Subacute cholecystitis with elevated liver enzymes.  POSTOPERATIVE DIAGNOSIS:  Subacute cholecystitis with common bile duct stones.  OPERATION PERFORMED:  Laparoscopic cholecystectomy with intraoperative cholangiogram, common bile duct instrumentation with a Fogarty catheter.  SURGEON:  Sandria Bales. Ezzard Standing, M.D.  ASSISTANT:  Velora Heckler, M.D.  ANESTHESIA:  General endotracheal.  ESTIMATED BLOOD LOSS:  Minimal.  INDICATIONS FOR PROCEDURE:  The patient is a 40 year old black female with known gallstones for at least three years who presents acutely ____________ to the emergency room with right upper quadrant pain and elevated liver enzymes.  She now comes for attempted laparoscopic cholecystectomy.  DESCRIPTION OF PROCEDURE:  Patient was placed in supine position and given a general endotracheal anesthetic.  She was already on Ancef, had PAS stockings in place.  Her abdomen was prepped with Betadine solution and sterilely draped.  An infraumbilical incision was made with sharp dissection and carried down to the abdominal cavity.  A 0 degree laparoscope was inserted through an infraumbilical incision through a 12 mm Hasson trocar and this was secured with a 0 Vicryl suture.  Abdominal exploration revealed some adhesions to her lower anterior abdominal wall but no other mass or lesion.  Both the right and left lobes of the liver were unremarkable.  The anterior wall of the stomach was unremarkable.  The gallbladder was mildly swollen and mildly edematous.  Three additional trocars were placed.  A 10 mm Ethicon trocar in the subxiphoid location and a 5 mm Ethicon trocar in the right  midsubcostal and a 5 mm Ethicon trocar in the right lateral subcostal location.  The gallbladder was then grasped and rotated cephalad.  The cystic duct and cystic artery were then identified.  I did intraoperative cholangiogram using a cut off taut catheter was inserted through a 14 gauge Jelco into the ____________ cystic duct and secured with an endoclip.  Intraoperative cholangiogram showed a free flow of contrast down the cystic duct but obstructed at the distal common bile duct with a ____________ sign.  I then tried to manipulate this stone with both a biliary Fogarty and wire. The biliary Fogarty I could get past into the duodenum.  I did retrieve at least two stones from the common bile duct but at least one more stone remaining.  I was able to get the wire basket into the common bile duct because I did not want to damage the common bile duct and thought that the angle was not very good for ____________ I tried to pass using the [place] system.  I then triply endoclipped the cystic duct, divided the cystic duct. Will attempt to do an ERCP with GI consultation postoperatively.  I then divided the cystic artery with three endoclips and divided the gallbladder ____________ gallbladder bed using hot hook Bovie coagulation.  Prior to complete division of the gallbladder from the gallbladder bed, I saw no bile leak.  No bile leak in the triangle of Calot and no bleeding from the gallbladder bed.  The  gallbladder was then divided and delivered to the umbilicus intact.  The abdomen was then irrigated with saline.  Umbilical incision closed with a 0 Vicryl suture.  All the other trocars removed under direct visualization without evidence of bleeding.  The patient tolerated the procedure well, will be transferred to the recovery room in good condition.  Again will need postoperative gastroenterology consult for ERCP.  Discussed the findings with Dr. Leslee Home. DD:  04/23/00 TD:   04/24/00 Job: [id number]

## 2010-05-21 NOTE — H&P (Signed)
[facility]  Patient:    Karen [name], Karen [name] Visit Number: [id number] MRN: [id number]          Service Type: END Location: ENDO Attending Physician:  Orland Mustard Dictated by:   Janine Limbo, M.D. Admit Date:  02/09/2001 Discharge Date: 02/09/2001   CC:         Reuben Likes, M.D.  Mardene Celeste Lurene Shadow, M.D.   History and Physical  HISTORY OF PRESENT ILLNESS:   Ms. Hornaday is a 40 year old female, para 1-1-0-2, who presents for diagnostic laparoscopy because of abdominal pain of uncertain etiology.  The patient has had multiple recent hospital emergency room visits because of abdominal pain.  No etiology has been found for her discomfort.  She does have a history of esophageal reflux disease, and she is status post cholecystectomy.  Her upper GI evaluation was completely normal. She has had CT scans and ultrasounds, and the only abnormality found was an ovarian cyst.  The patient has had a cesarean section in the past.   Her laboratory evaluations have all been normal.  PAST OBSTETRICAL HISTORY:     The patient had a vaginal delivery at term in 1998.  She had a cesarean section in 1995 at [redacted] weeks gestation.  DRUG ALLERGIES:               None known.  PAST MEDICAL HISTORY:         The patient has a history of gastroesophageal reflux disease as mentioned above.  She is status post cholecystectomy. There is a questionable history of superficial thrombophlebitis.  CURRENT MEDICATIONS:          Include Levsin, Allegra D, Aciphex, Percocet p.r.n., vitamins, and Questran.  SOCIAL HISTORY:               The patient denies cigarette use, alcohol use, and recreational drug use.  REVIEW OF SYSTEMS:            Please see History of Present Illness.  FAMILY HISTORY:               The patients paternal grandmother and her father both had colon cancer.  The patient admits that she is very afraid of having cancer herself.  PHYSICAL  EXAMINATION:  VITAL SIGNS:                  The patient is afebrile.  Her vital signs are stable.  HEENT:                        Within normal limits.  CHEST:                        Clear.  HEART:                        Regular rate and rhythm.  BREASTS:                      Without masses.  ABDOMEN:                      Nontender.  No CVA tenderness.  EXTREMITIES:                  Within normal limits.  NEUROLOGIC:                   Grossly  normal.  PELVIC:                       External genitalia normal.  Vagina is normal. Cervix is nontender.  Uterus is normal size, shape, and consistency.  Adnexa: No masses.  ASSESSMENT:                   1. Pelvic pain of uncertain etiology.                               2. History of an ovarian cyst.                               3. Gastroesophageal reflux disease.  PLAN:                         The patient will undergo a diagnostic laparoscopy.  She understands the indications for her procedure, and she accepts the risks of, but not limited to, anesthetic complications. bleeding, infection, and possible damage to surrounding organs.  She understands that no guarantees can be given concerning the total relief of her pelvic discomfort. ictated by:   Janine Limbo, M.D. Attending Physician:  Orland Mustard DD:  02/14/01 TD:  02/14/01 Job: [phone]

## 2010-05-21 NOTE — Procedures (Signed)
[facility]  Patient:    Karen [name], Karen [name] Visit Number: [id number] MRN: [id number]          Service Type: END Location: ENDO Attending Physician:  Orland Mustard Dictated by:   Llana Aliment. Randa Evens, M.D. Proc. Date: 10/17/00 Admit Date:  10/17/2000   CC:         Reuben Likes, M.D.   Procedure Report  PROCEDURE:  Colonoscopy  ENDOSCOPIST:  Fayrene Fearing L. Randa Evens, M.D.  MEDICATIONS:  Fentanyl 75 mcg, Versed 10 mechanical ventilation IV  INDICATIONS:  Patient with strong family history of colon and pancreatic cancer in parents at a young age with persistent right lower quadrant pain. She has also had upper pain which is much improved with Aciphex.  This procedure is done to evaluate for cause of her right lower quadrant pain, to evaluate for any signs of early polyps or neoplasia.  DESCRIPTION OF PROCEDURE:  Procedure had been explained to the patient and consent obtained.  With the patient in the left lateral decubitus position, the Olympus pediatric colonoscope was inserted and advanced under direct visualization.  The prep was excellent and we were able to reach the cecum without a great deal of difficulty.  The ileocecal valve and appendiceal orifice were seen.  The scope was withdrawn in the cecum, ascending colon, hepatic flexure, transverse colon, splenic flexure, descending and sigmoid colon were all seen well upon removal.  No polyps or other lesions were seen. No significant diverticular disease.  The rectum was free of polyps or other lesions.  The scope was withdrawn.  The patient tolerated the procedure well.  ASSESSMENT: 1. Strong family history of colonic and pancreatic cancer with normal    colonoscopy. 2. Right lower quadrant pain, no abnormalities seen to explain it on    colonoscopy.  PLAN:  Same medications.  See back in the office in six to eight weeks.  Will recommend repeating procedure in five years. Dictated by:   Llana Aliment. Randa Evens, M.D. Attending Physician:  Orland Mustard DD:  10/17/00 TD:  10/17/00 Job: [id number]

## 2010-05-21 NOTE — Procedures (Signed)
[place]. [facility]  Patient:    AMIREE, NO Visit Number: [id number] MRN: [id number]          Service Type: END Location: ENDO Attending Physician:  Orland Mustard Dictated by:   Llana Aliment. Randa Evens, M.D. Proc. Date: 02/09/01 Admit Date:  02/09/2001 Discharge Date: 02/09/2001   CC:         Reuben Likes, M.D.  Janine Limbo, M.D.   Procedure Report  DATE OF BIRTH:  2070/11/27  REASON FOR ENDOSCOPY:  The patient has had vague gastrointestinal symptoms, including some upper abdominal pain.  Has had an extensive workup that showed a ruptured ovarian cyst.  It is anticipated that she is going to undergo a laparoscopy next week by Dr. Stefano Gaul.  He has seen her, and has her on the schedule for this.  She has had multiple other studies done, none of which have shown the cause of her pain.  In addition to the lower abdominal pain, she has had upper abdominal pain.  In view of her upper abdominal pain, and endoscopy is done prior to the anticipated laparoscopy next week.  DESCRIPTION OF PROCEDURE:  The procedure had been explained to the patient, consent obtained.  With the patient in the left lateral decubitus position, the Olympus video endoscope was inserted blindly into the esophagus, and advanced under direct visualization.  The stomach was entered, pylorus identified and passed.  The duodenum, including the bulb and the second portion were seen well and were unremarkable.  The scope was withdrawn back into the stomach, and the antrum and body were seen well and were normal. Fundus and cardia were seen well in the retroflex view and were normal.  There were no ulcerations, no other significant abnormalities.  The distal and proximal esophagus were seen well upon removal of the scope and appeared normal.  ASSESSMENT:  Abdominal pain with nothing on upper endoscopy to explain her pain.  At this point, it appears that the only significant  abnormalities are the ruptured ovarian cyst, and I think we need to explore this further.  PLAN:  The patient will see Dr. Stefano Gaul next week, and proceed with laparoscopy as planned.  We will recommend that she remain on Aciphex.  We will plan on seeing her back in our office in 2 to 3 months. Dictated by:   Llana Aliment. Randa Evens, M.D. Attending Physician:  Orland Mustard DD:  02/09/01 TD:  02/11/01 Job: [id number]

## 2010-06-16 ENCOUNTER — Ambulatory Visit
Admission: RE | Admit: 2010-06-16 | Discharge: 2010-06-16 | Disposition: A | Discharge status: Home / Self Care | Payer: BC Managed Care – PPO | Source: Ambulatory Visit | Attending: Internal Medicine | Admitting: Internal Medicine

## 2010-06-16 ENCOUNTER — Other Ambulatory Visit: Payer: Self-pay | Admitting: Internal Medicine

## 2010-06-16 DIAGNOSIS — M79606 Pain in leg, unspecified: Secondary | ICD-10-CM

## 2011-01-04 DIAGNOSIS — M1990 Unspecified osteoarthritis, unspecified site: Secondary | ICD-10-CM

## 2011-01-04 HISTORY — DX: Unspecified osteoarthritis, unspecified site: M19.90

## 2011-05-02 ENCOUNTER — Encounter (HOSPITAL_COMMUNITY): Payer: Self-pay | Admitting: *Deleted

## 2011-05-02 ENCOUNTER — Ambulatory Visit (HOSPITAL_COMMUNITY)
Admission: RE | Admit: 2011-05-02 | Discharge: 2011-05-02 | Disposition: A | Discharge status: Home / Self Care | Payer: 59 | Source: Ambulatory Visit | Attending: Gastroenterology | Admitting: Gastroenterology

## 2011-05-02 ENCOUNTER — Encounter (HOSPITAL_COMMUNITY)
Admission: RE | Disposition: A | Discharge status: Home / Self Care | Payer: Self-pay | Source: Ambulatory Visit | Attending: Gastroenterology

## 2011-05-02 DIAGNOSIS — K222 Esophageal obstruction: Secondary | ICD-10-CM | POA: Insufficient documentation

## 2011-05-02 DIAGNOSIS — K449 Diaphragmatic hernia without obstruction or gangrene: Secondary | ICD-10-CM | POA: Insufficient documentation

## 2011-05-02 DIAGNOSIS — Z800 Family history of malignant neoplasm of digestive organs: Secondary | ICD-10-CM | POA: Insufficient documentation

## 2011-05-02 DIAGNOSIS — R197 Diarrhea, unspecified: Secondary | ICD-10-CM | POA: Insufficient documentation

## 2011-05-02 DIAGNOSIS — R1310 Dysphagia, unspecified: Secondary | ICD-10-CM | POA: Insufficient documentation

## 2011-05-02 HISTORY — PX: COLONOSCOPY: SHX5424

## 2011-05-02 HISTORY — DX: Acute embolism and thrombosis of unspecified deep veins of unspecified lower extremity: I82.409

## 2011-05-02 HISTORY — DX: Syncope and collapse: R55

## 2011-05-02 HISTORY — DX: Migraine, unspecified, not intractable, without status migrainosus: G43.909

## 2011-05-02 HISTORY — PX: ESOPHAGOGASTRODUODENOSCOPY: SHX5428

## 2011-05-02 SURGERY — EGD (ESOPHAGOGASTRODUODENOSCOPY)
Anesthesia: Moderate Sedation

## 2011-05-02 MED ORDER — SODIUM CHLORIDE 0.9 % IV SOLN
Freq: Once | INTRAVENOUS | Status: AC
  Administered 2011-05-02: 500 mL via INTRAVENOUS

## 2011-05-02 MED ORDER — MIDAZOLAM HCL 10 MG/2ML IJ SOLN
INTRAMUSCULAR | Status: AC
  Filled 2011-05-02: qty 4

## 2011-05-02 MED ORDER — SODIUM CHLORIDE 0.9 % IV SOLN
INTRAVENOUS | Status: DC
  Administered 2011-05-02: 500 mL via INTRAVENOUS

## 2011-05-02 MED ORDER — MIDAZOLAM HCL 10 MG/2ML IJ SOLN
INTRAMUSCULAR | Status: DC | PRN
  Administered 2011-05-02: 2 mg via INTRAVENOUS
  Administered 2011-05-02: 1 mg via INTRAVENOUS
  Administered 2011-05-02: 2.5 mg via INTRAVENOUS
  Administered 2011-05-02 (×2): 2 mg via INTRAVENOUS
  Administered 2011-05-02: 2.5 mg via INTRAVENOUS

## 2011-05-02 MED ORDER — FENTANYL NICU IV SYRINGE 50 MCG/ML
INJECTION | INTRAMUSCULAR | Status: DC | PRN
  Administered 2011-05-02 (×5): 25 ug via INTRAVENOUS

## 2011-05-02 MED ORDER — FENTANYL CITRATE 0.05 MG/ML IJ SOLN
INTRAMUSCULAR | Status: AC
  Filled 2011-05-02: qty 4

## 2011-05-02 MED ORDER — BUTAMBEN-TETRACAINE-BENZOCAINE 2-2-14 % EX AERO
INHALATION_SPRAY | CUTANEOUS | Status: DC | PRN
  Administered 2011-05-02: 2 via TOPICAL

## 2011-05-02 NOTE — Op Note (Signed)
[facility] [address] [place], [place]  [address]  ENDOSCOPY PROCEDURE REPORT  PATIENT:  Karen [name], Karen [name]  MR#:  [id number] BIRTHDATE:  30-Jan-1970, 40 yrs. old  GENDER:  female  ENDOSCOPIST:  Carman Ching Referred by:  Renford Dills, M.D.  PROCEDURE DATE:  05/02/2011 PROCEDURE:  EGD, diagnostic 43235 ASA CLASS:  Class II INDICATIONS:  dysphagia  MEDICATIONS:   Fentanyl 75 mcg IV, Versed 8 mg IV TOPICAL ANESTHETIC:  Cetacaine Spray  DESCRIPTION OF PROCEDURE:   After the risks and benefits of the procedure were explained, informed consent was obtained.  The Pentax Gastroscope [id number] endoscope was introduced through the mouth and advanced to the second portion of the duodenum.  The instrument was slowly withdrawn as the mucosa was fully examined.  A Schatzki's ring was found in the distal esophagus. this was seen only with maximum extension with air. It was fairly minimal and the esophagus was widely patent. There was a small hiatal hernia.the esophagus was otherwise completely normal.  the duodenal bulb and second duodenum were without abnormalities.  nl stomach.    Retroflexed views revealed a hiatal hernia.    The scope was then withdrawn from the patient and the procedure completed.  COMPLICATIONS:  A complication of none occurred on 05/02/2011 at.  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 RECOMMENDATIONS: we will continue the patient her medications. We'll encourage her to chew her food well. If she continues to have problems we will offer dilatation after discussing the risk. The distal esophagus as a widely patent she may would do well with conservative measures. We will see her back in the office in several weeks.  ______________________________ Carman Ching  CC:  Renford Dills MD  n. Rosalie DoctorCarman Ching at 05/02/2011 09:24 AM  Elmer Picker, [phone]

## 2011-05-02 NOTE — Op Note (Signed)
[facility] [address] [place], [place]  [address]  COLONOSCOPY PROCEDURE REPORT  PATIENT:  Karen [name], Karen [name]  MR#:  [id number] BIRTHDATE:  Aug 25, 1970, 40 yrs. old  GENDER:  female ENDOSCOPIST:  Carman Ching REF. BY:  Renford Dills, M.D. PROCEDURE DATE:  05/02/2011 PROCEDURE:  Colonoscopy with biopsy ASA CLASS:  Class II INDICATIONS:  unexplained diarrhea, family history of colon cancer mothe and father both had colon cancer MEDICATIONS:   Fentanyl 125 mcg IV, Versed 12 mg IV  DESCRIPTION OF PROCEDURE:   After the risks and benefits and of the procedure were explained, informed consent was obtained. Digital rectal exam was performed and revealed no abnormalities. The EC-[id number]) endoscope was introduced through the anus and advanced to the cecum.  The quality of the prep was good..  The instrument was then slowly withdrawn as the colon was fully examined. <<PROCEDUREIMAGES>>  FINDINGS:  no active bleeding or blood in c.  there were no diverticular disease polyps etc. The mucosa was normal throughout. Multiple biopsies were obtained to evaluate for microscopic colitis. The exam was difficult due to a long tortuous colon and required abdominal pressure and placing the patient in multiple positions. We were finally able to reach the cecum with the patient in the right lateral decubitus position using abdominal pressure.   Retroflexed views in the rectum revealed no abnormalities.    The scope was then withdrawn from the patient and the procedure completed.  COMPLICATIONS:  A complication of none occured on 05/02/2011 at. ENDOSCOPIC IMPRESSION: 1) Family history of colon cancer 2) Mucosa grossly normal and was biopsied to look for microscopic colitis diarrhea without obvious calls mucosa normal was biopsied RECOMMENDATIONS: 1) Follow up colonoscopy in 5 years 2) Await biopsy results 3) High fiber diet.  REPEAT EXAM:   No  ______________________________ Carman Ching  CC:  Renford Dills MD  n. Rosalie DoctorCarman Ching at 05/02/2011 09:37 AM  Elmer Picker, [phone]

## 2011-05-02 NOTE — Discharge Instructions (Signed)
Continue all home meds

## 2011-05-02 NOTE — H&P (Signed)
  Subjective:   Patient is a 41 y.o. female presents with dysphagia and diarrhea.  Has family history of colon cancer and is due for colonoscopy at this time. Procedure including risks and benefits discussed in office.  There are no active problems to display for this patient.  Past Medical History  Diagnosis Date  . Migraine   . Syncope, non cardiac   . DVT of lower extremity (deep venous thrombosis)     Past Surgical History  Procedure Date  . Vaginal hysterectomy   . Arthroscopic haglunds repair   . Cholecystectomy   . Laproscopic abdominal     Prescriptions prior to admission  Medication Sig Dispense Refill  . calcium-vitamin D 250-100 MG-UNIT per tablet Take 1 tablet by mouth 2 (two) times daily.      . cetirizine (ZYRTEC) 10 MG tablet Take 10 mg by mouth daily.      Marland Kitchen omeprazole (PRILOSEC) 40 MG capsule Take 40 mg by mouth daily.       Not on File  History  Substance Use Topics  . Smoking status: Never Smoker   . Smokeless tobacco: Not on file  . Alcohol Use: No    History reviewed. No pertinent family history.   Objective:   Patient Vitals for the past 8 hrs:  BP Temp Pulse Resp SpO2 Height Weight  05/02/11 0816 116/67 mmHg - - 20  99 % - -  05/02/11 0743 - 98.3 F (36.8 C) 87  15  97 % 5\' 7"  (1.702 m) 90.719 kg (200 lb)         See MD Preop evaluation      Assessment:   Active Problems:  * No active hospital problems. *    Plan:   EGD to evaluate the dysphagia and colonoscopy with biopsy.

## 2011-05-03 ENCOUNTER — Encounter (HOSPITAL_COMMUNITY): Payer: Self-pay | Admitting: Gastroenterology

## 2011-10-01 ENCOUNTER — Emergency Department (HOSPITAL_COMMUNITY)
Admission: EM | Admit: 2011-10-01 | Discharge: 2011-10-01 | Disposition: A | Discharge status: Home / Self Care | Payer: 59 | Source: Home / Self Care

## 2011-10-01 ENCOUNTER — Encounter (HOSPITAL_COMMUNITY): Payer: Self-pay | Admitting: Emergency Medicine

## 2011-10-01 DIAGNOSIS — J069 Acute upper respiratory infection, unspecified: Secondary | ICD-10-CM

## 2011-10-01 MED ORDER — CETIRIZINE HCL 10 MG PO TABS: 10.0000 mg | ORAL_TABLET | Freq: Every day | ORAL | Status: DC

## 2011-10-01 MED ORDER — TRAMADOL HCL 50 MG PO TABS: 50.0000 mg | ORAL_TABLET | Freq: Four times a day (QID) | ORAL | Status: DC | PRN

## 2011-10-01 NOTE — ED Notes (Signed)
Pt c/o headache since yesterday has taken tylenol with no relief. Pt states that she feels drunk.  Having swelling/puffiness in legs x 1 day.

## 2011-10-01 NOTE — ED Provider Notes (Signed)
History     CSN: [id number]  Arrival date & time 10/01/11  1846   None     Chief Complaint  Patient presents with  . Headache  . Dizziness    (Consider location/radiation/quality/duration/timing/severity/associated sxs/prior treatment) Patient is a 41 y.o. female presenting with headaches. The history is provided by the patient.  Headache The primary symptoms include headaches and dizziness. The symptoms began 12 to 24 hours ago. The episode lasted 1 day. The symptoms are waxing and waning. The neurological symptoms are focal.  The headache is not associated with neck stiffness or weakness.  Dizziness does not occur with weakness.  Additional symptoms do not include neck stiffness or weakness.   Not worst headache, hx of migraines usually improved with tyelnol, reports this headache is not the same as migraine.  Admits to uri symptoms, recently stopped taking zyrtec because she ran out.  Reports dizziness, sensation of room spinning.    Additionally expresses concern in regards to lower extremity edema, persistent.  Evaluated in office by PCP one week ago, placed on lasix.  Admits to sob with exertion, denies chest pain or other cardiac symptoms.   Past Medical History  Diagnosis Date  . Migraine   . Syncope, non cardiac   . DVT of lower extremity (deep venous thrombosis)     Past Surgical History  Procedure Date  . Vaginal hysterectomy   . Arthroscopic haglunds repair   . Cholecystectomy   . Laproscopic abdominal   . Esophagogastroduodenoscopy 05/02/2011    Procedure: ESOPHAGOGASTRODUODENOSCOPY (EGD);  Surgeon: Vertell Novak., MD;  Location: [facility];  Service: Endoscopy;  Laterality: N/A;  . Colonoscopy 05/02/2011    Procedure: COLONOSCOPY;  Surgeon: Vertell Novak., MD;  Location: [facility];  Service: Endoscopy;  Laterality: N/A;    History reviewed. No pertinent family history.  History  Substance Use Topics  . Smoking status: Never Smoker   .  Smokeless tobacco: Not on file  . Alcohol Use: No    OB History    Grav Para Term Preterm Abortions TAB SAB Ect Mult Living                  Review of Systems  Constitutional: Negative.   HENT: Positive for congestion, neck pain, postnasal drip and sinus pressure. Negative for ear pain, sore throat, rhinorrhea, trouble swallowing, neck stiffness and ear discharge.   Respiratory: Positive for cough and shortness of breath. Negative for choking, chest tightness, wheezing and stridor.   Cardiovascular: Negative.   Gastrointestinal: Negative.   Skin: Negative.   Neurological: Positive for dizziness and headaches. Negative for tremors, syncope, facial asymmetry, speech difficulty, weakness, light-headedness and numbness.    Allergies  Review of patient's allergies indicates no known allergies.  Home Medications   Current Outpatient Rx  Name Route Sig Dispense Refill  . CALCIUM CITRATE-VITAMIN D 250-100 MG-UNIT PO TABS Oral Take 1 tablet by mouth 2 (two) times daily.    Marland Kitchen CETIRIZINE HCL 10 MG PO TABS Oral Take 1 tablet (10 mg total) by mouth daily. 30 tablet 2  . FUROSEMIDE 20 MG PO TABS Oral Take 20 mg by mouth 2 (two) times daily.    . MELOXICAM 15 MG PO TABS Oral Take 15 mg by mouth daily.    Marland Kitchen OMEPRAZOLE 40 MG PO CPDR Oral Take 40 mg by mouth daily.    . TRAMADOL HCL 50 MG PO TABS Oral Take 1 tablet (50 mg total) by mouth every 6 (six)  hours as needed for pain. 15 tablet 0    BP 116/73  Pulse 79  Temp 98 F (36.7 C) (Oral)  Resp 20  SpO2 100%  Physical Exam  Nursing note and vitals reviewed. Constitutional: She is oriented to person, place, and time. Vital signs are normal. She appears well-developed and well-nourished. She is active and cooperative.  HENT:  Head: Normocephalic.  Right Ear: Hearing, tympanic membrane, external ear and ear canal normal.  Left Ear: Hearing, tympanic membrane, external ear and ear canal normal.  Nose: Right sinus exhibits maxillary sinus  tenderness. Right sinus exhibits no frontal sinus tenderness. Left sinus exhibits maxillary sinus tenderness. Left sinus exhibits no frontal sinus tenderness.  Mouth/Throat: Uvula is midline and mucous membranes are normal. Posterior oropharyngeal erythema present. No oropharyngeal exudate, posterior oropharyngeal edema or tonsillar abscesses.  Eyes: Conjunctivae normal are normal. Pupils are equal, round, and reactive to light. No scleral icterus.  Neck: Trachea normal, normal range of motion and full passive range of motion without pain. Neck supple. Muscular tenderness present.  Cardiovascular: Normal rate, regular rhythm, intact distal pulses and normal pulses.  Exam reveals distant heart sounds.   No murmur heard.      Minimal edema in bilateral lower extremities  Pulmonary/Chest: Effort normal and breath sounds normal.  Abdominal: Soft. Normal appearance and bowel sounds are normal. There is no tenderness.  Lymphadenopathy:    She has cervical adenopathy.       Shotty cervical lymph nodes  Neurological: She is alert and oriented to person, place, and time. She has normal strength and normal reflexes. No cranial nerve deficit or sensory deficit. Coordination and gait normal. GCS eye subscore is 4. GCS verbal subscore is 5. GCS motor subscore is 6.  Skin: Skin is warm and dry.  Psychiatric: She has a normal mood and affect. Her speech is normal and behavior is normal. Judgment and thought content normal. Cognition and memory are normal.    ED Course  Procedures (including critical care time)  Labs Reviewed - No data to display No results found.   1. URI (upper respiratory infection)       MDM  Increase fluid intake, rest.  Begin saline nasal spray and/or saline irrigation, and cough suppressant at bedtime. Restart your antihistamines of your choice (Claritin or Zyrtec).  Tylenol or Motrin for fever/discomfort.  Followup with Dr. Nehemiah Settle for the management of your edema, no neuro or  cardiac risk factors that warrant emergent follow up today.  Call Dr. Nehemiah Settle on Monday.         Johnsie Kindred, NP 10/03/11 1553

## 2011-10-03 ENCOUNTER — Encounter: Payer: Self-pay | Admitting: Internal Medicine

## 2011-10-03 NOTE — ED Provider Notes (Signed)
Medical screening examination/treatment/procedure(s) were performed by resident physician or non-physician practitioner and as supervising physician I was immediately available for consultation/collaboration.   Barkley Bruns MD.    Linna Hoff, MD 10/03/11 2111

## 2011-10-20 ENCOUNTER — Other Ambulatory Visit (HOSPITAL_COMMUNITY): Payer: Self-pay | Admitting: Orthopedic Surgery

## 2011-10-20 DIAGNOSIS — M25562 Pain in left knee: Secondary | ICD-10-CM

## 2011-10-26 ENCOUNTER — Encounter: Payer: 59 | Attending: Internal Medicine | Admitting: *Deleted

## 2011-10-26 ENCOUNTER — Encounter: Payer: Self-pay | Admitting: *Deleted

## 2011-10-26 ENCOUNTER — Ambulatory Visit (HOSPITAL_COMMUNITY)
Admission: RE | Admit: 2011-10-26 | Discharge: 2011-10-26 | Disposition: A | Discharge status: Home / Self Care | Payer: 59 | Source: Ambulatory Visit | Attending: Orthopedic Surgery | Admitting: Orthopedic Surgery

## 2011-10-26 DIAGNOSIS — M25562 Pain in left knee: Secondary | ICD-10-CM

## 2011-10-26 DIAGNOSIS — M25569 Pain in unspecified knee: Secondary | ICD-10-CM | POA: Insufficient documentation

## 2011-10-26 DIAGNOSIS — M2240 Chondromalacia patellae, unspecified knee: Secondary | ICD-10-CM | POA: Insufficient documentation

## 2011-10-26 DIAGNOSIS — E669 Obesity, unspecified: Secondary | ICD-10-CM | POA: Insufficient documentation

## 2011-10-26 DIAGNOSIS — R609 Edema, unspecified: Secondary | ICD-10-CM | POA: Insufficient documentation

## 2011-10-26 DIAGNOSIS — Z713 Dietary counseling and surveillance: Secondary | ICD-10-CM | POA: Insufficient documentation

## 2011-10-26 NOTE — Patient Instructions (Signed)
Plan: Aim for 3 Carb Choices (45 grams) per meal +/- 1 either way Aim for 1 Carb Choice per snack if hungry Continue with low fat choices Consider reading food labels for total carbohydrate of foods Consider options for increasing your activity level either sitting down with Arm Chair exercises or water exercises

## 2011-10-26 NOTE — Progress Notes (Signed)
  Medical Nutrition Therapy:  Appt start time: 1145 end time:  1245.  Assessment:  Primary concerns today: patient here for weight loss. 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 She lives with her husband and 2 daughters. 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 She had success with weight loss through [facility] 1 1/2 years ago through weekly visits and increase activity.  MEDICATIONS: see list   DIETARY INTAKE:  Usual eating pattern includes 3 meals and 2-3 snacks per day.  Everyday foods include good variety of all food groups.  Avoided foods include salty foods, pork, fried foods.    24-hr recall:  B ( AM): Raisin Bran, skim milk, banana, toast and jelly OR breakfast biscuit, water  Snk ( AM): [place] yogurt with fresh fruit added  L ( PM): bring from home; salad with grilled chicken and tortilla chips, low fat dressing, fresh fruit, water Snk ( PM): not usually, unless fruit from lunch OR Subway cookie D ( PM): lean meat, occasionally a starch, vegetable or salad, water Snk ( PM): fresh fruit if anything Beverages: water, maybe one regular soda a day  Usual physical activity: walks at work as able, limited with knee problems,  Estimated energy needs: 1400 calories 158 g carbohydrates 105 g protein 39 g fat  Progress Towards Goal(s):  In progress.   Nutritional Diagnosis:  NI-1.5 Excessive energy intake As related to activity level.  As evidenced by BMI of 33.4.    Intervention:  Nutrition counseling for weight management initiated. Discussed Carb Counting as method of monitoring food intake and reading food labels, and benefits of increased activity.   Plan: Aim for 3 Carb Choices (45 grams) per meal +/- 1 either way Aim for 1 Carb Choice per snack if hungry Continue with low fat choices Consider reading food labels for total carbohydrate of  foods Consider options for increasing your activity level either sitting down with Arm Chair exercises or water exercises  Handouts given during visit include: Carb Counting and Food Label handouts Meal Plan Card  Monitoring/Evaluation:  Dietary intake, exercise, reading food labels, and body weight in 4 week(s).

## 2011-11-03 ENCOUNTER — Encounter: Payer: Self-pay | Admitting: *Deleted

## 2011-11-28 ENCOUNTER — Encounter: Payer: Self-pay | Admitting: *Deleted

## 2011-11-28 ENCOUNTER — Encounter: Payer: 59 | Attending: Internal Medicine | Admitting: *Deleted

## 2011-11-28 DIAGNOSIS — E669 Obesity, unspecified: Secondary | ICD-10-CM | POA: Insufficient documentation

## 2011-11-28 DIAGNOSIS — Z713 Dietary counseling and surveillance: Secondary | ICD-10-CM | POA: Insufficient documentation

## 2011-11-28 NOTE — Progress Notes (Signed)
  Medical Nutrition Therapy:  Appt start time: 1715 end time:  1745.  Assessment:  Primary concerns today: patient here for follow up visit for obesity. She has cut back on fresh fruits to 2 servings per day and raisin bran to reduce carb choices but now has constipation. She is now cooking on the weekend for the week so more home cooked food. Still having issues with knee, plans follow up with physical therapist to increase activity safely.  MEDICATIONS: see list   DIETARY INTAKE:  Usual eating pattern includes 3 meals and 2-3 snacks per day.  Everyday foods include good variety of all food groups.  Avoided foods include salty foods, pork, fried foods.    24-hr recall:  B ( AM): smaller portion of Raisin Bran now, skim milk, banana, toast and jelly OR no more breakfast biscuits!, water  Snk ( AM): [place] yogurt with fresh fruit added  L ( PM): bring from home; salad with grilled chicken and tortilla chips, low fat dressing, fresh fruit, water Snk ( PM): not usually, unless fruit from lunch OR Subway cookie D ( PM): lean meat, occasionally a starch, vegetable or salad, water Snk ( PM): fresh fruit if anything Beverages: water, not even one regular soda a day  Usual physical activity: walks at work as able, limited with knee problems,  Estimated energy needs: 1400 calories 158 g carbohydrates 105 g protein 39 g fat  Progress Towards Goal(s):  In progress.   Nutritional Diagnosis:  NI-1.5 Excessive energy intake As related to activity level.  As evidenced by BMI of 33.4, now down to 32.4.    Intervention:  Patient happy with weight loss of almost 7 pounds in 4 weeks! Commended her on her various behavior changes. Answered questions regarding her reduction of fresh fruit to lower carb intake but then having difficulty with constipation. With her weight loss of almost 2 pounds per week, I recommend she resume her fresh fruit choices by 2 more per day. Also discussed other fiber choices  to consider.  Plan: Continue aiming for 3-4 Carb Choices (45-60 grams) per meal +/- 1 either way Aim for 1 Carb Choice per snack if hungry Continue with low fat choices Continue reading food labels for total carbohydrate of foods Consider options for increasing your activity level once you meet with physical therapist  Handouts given during visit include: High Fiber handout  Monitoring/Evaluation:  Dietary intake, exercise, reading food labels, and body weight in 4 week(s).

## 2011-11-28 NOTE — Patient Instructions (Addendum)
Plan: Continue aiming for 3-4 Carb Choices (45-60 grams) per meal +/- 1 either way Aim for 1 Carb Choice per snack if hungry Continue with low fat choices Continue reading food labels for total carbohydrate of foods Consider options for increasing your activity level once you meet with physical therapist

## 2011-12-30 ENCOUNTER — Encounter: Payer: Self-pay | Admitting: *Deleted

## 2011-12-30 ENCOUNTER — Encounter: Payer: 59 | Attending: Internal Medicine | Admitting: *Deleted

## 2011-12-30 DIAGNOSIS — Z713 Dietary counseling and surveillance: Secondary | ICD-10-CM | POA: Insufficient documentation

## 2011-12-30 DIAGNOSIS — E669 Obesity, unspecified: Secondary | ICD-10-CM | POA: Insufficient documentation

## 2011-12-30 NOTE — Patient Instructions (Signed)
Plan: Continue aiming for 3 Carb Choices (45 grams) per meal +/- 1 either way Aim for 1 Carb Choice per snack if hungry Continue with low fat choices and start counting fat grams @ 15 grams per meal. Continue reading food labels for total carbohydrate of foods and now fat grams foo Continue with increasing walking and leg exercises as tolerated.

## 2011-12-30 NOTE — Progress Notes (Signed)
  Medical Nutrition Therapy:  Appt start time: 1715 end time:  1745.  Assessment:  Primary concerns today: patient here for follow up visit for obesity. Had knee injection that has helped and she is walking more and doing leg raises daily. Tried Pepsi Next, and really likes it, has realized too much red meat makes her feel too full, back to lighter meals, happy with weight loss especially during the holidays  MEDICATIONS: see list   DIETARY INTAKE:  Usual eating pattern includes 3 meals and 2-3 snacks per day.  Everyday foods include good variety of all food groups.  Avoided foods include salty foods, pork, fried foods.    24-hr recall:  B ( AM): smaller portion of Raisin Bran now, skim milk, banana, toast and jelly OR no more breakfast biscuits!, water  Snk ( AM): [place] yogurt with fresh fruit added  L ( PM): bring from home; salad with grilled chicken and tortilla chips, low fat dressing, fresh fruit, water Snk ( PM): not usually, unless fruit from lunch OR Subway cookie D ( PM): lean meat, occasionally a starch, vegetable or salad, water Snk ( PM): fresh fruit if anything Beverages: water, no more regular soda anymore  Usual physical activity: walks at work as able, limited with knee problems,  Estimated energy needs: 1400 calories 158 g carbohydrates 105 g protein 39 g fat  Progress Towards Goal(s):  In progress.   Nutritional Diagnosis:  NI-1.5 Excessive energy intake As related to activity level.  As evidenced by BMI of 33.4, now down to 32.4.    Intervention:  Commended her on her various behavior changes. Reviewed possibility of using [employer] like [employer] for instant nutrition information especially in restaurants. Also discussed calorie value of fat grams and how to incorporate fat gram counting into her meal plan.  Plan: Continue aiming for 3 Carb Choices (45 grams) per meal +/- 1 either way Aim for 1 Carb Choice per snack if hungry Continue with low fat choices and  start counting fat grams @ 15 grams per meal. Continue reading food labels for total carbohydrate of foods and now fat grams foo Continue with increasing walking and leg exercises as tolerated.  Monitoring/Evaluation:  Dietary intake, exercise, reading food labels, and body weight in 4 week(s).

## 2012-01-10 ENCOUNTER — Ambulatory Visit (INDEPENDENT_AMBULATORY_CARE_PROVIDER_SITE_OTHER): Payer: 59 | Admitting: Obstetrics and Gynecology

## 2012-01-10 ENCOUNTER — Encounter: Payer: Self-pay | Admitting: Obstetrics and Gynecology

## 2012-01-10 VITALS — BP 110/68 | Resp 16 | Ht 67.5 in | Wt 212.0 lb

## 2012-01-10 DIAGNOSIS — Z124 Encounter for screening for malignant neoplasm of cervix: Secondary | ICD-10-CM

## 2012-01-10 DIAGNOSIS — Z01419 Encounter for gynecological examination (general) (routine) without abnormal findings: Secondary | ICD-10-CM

## 2012-01-10 NOTE — Progress Notes (Signed)
ANNUAL GYNECOLOGIC EXAMINATION   Karen [name] is a 42 y.o. female, G2P2, who presents for an annual exam. The patient is status post hysterectomy.  She complains of hot flashes.  She is stressed caring for her elderly parents.  Both of her parents had colon cancer.  She has yearly colonoscopy scheduled.    History   Social History  . Marital Status: Married    Spouse Name: N/A    Number of Children: N/A  . Years of Education: N/A   Social History Main Topics  . Smoking status: Never Smoker   . Smokeless tobacco: Never Used  . Alcohol Use: No  . Drug Use: No  . Sexually Active: Yes -- Female partner(s)    Birth Control/ Protection: None     Comment: married    Other Topics Concern  . None   Social History Narrative  . None    Menstrual cycle:   LMP: No LMP recorded. Patient has had a hysterectomy.             The following portions of the patient's history were reviewed and updated as appropriate: allergies, current medications, past family history, past medical history, past social history, past surgical history and problem list.  Review of Systems Pertinent items are noted in HPI. Breast:Negative for breast lump,nipple discharge or nipple retraction Gastrointestinal: Negative for abdominal pain, change in bowel habits or rectal bleeding Urinary:negative   Objective:    BP 110/68  Resp 16  Ht 5' 7.5" (1.715 m)  Wt 212 lb (96.163 kg)  BMI 32.71 kg/m2    Weight:  Wt Readings from Last 1 Encounters:  01/10/12 212 lb (96.163 kg)          BMI: Body mass index is 32.71 kg/(m^2).  General Appearance: Alert, appropriate appearance for age. No acute distress HEENT: Grossly normal Neck / Thyroid: Supple, no masses, nodes or enlargement Lungs: clear to auscultation bilaterally Back: No CVA tenderness Breast Exam: No masses or nodes.No dimpling, nipple retraction or discharge. Cardiovascular: Regular rate and rhythm. S1, S2, no murmur Gastrointestinal: Soft,  non-tender, no masses or organomegaly  ++++++++++++++++++++++++++++++++++++++++++++++++++++++++  Pelvic Exam: External genitalia: normal general appearance Vaginal: normal without tenderness, induration or masses. Relaxation: Yes Cervix: absent Adnexa: normal bimanual exam Uterus: absent Rectovaginal: normal rectal, no masses  ++++++++++++++++++++++++++++++++++++++++++++++++++++++++  Lymphatic Exam: Non-palpable nodes in neck, clavicular, axillary, or inguinal regions Neurologic: Normal speech, no tremor  Psychiatric: Alert and oriented, appropriate affect.  Assessment:    Normal gyn exam   Overweight or obese: Yes   Pelvic relaxation: Yes  Menopausal symptoms  Stress   Plan:    mammogram return annually or prn Contraception:hysterectomy    Medications prescribed: none  STD screen request: No   The updated Pap smear screening guidelines were discussed with the patient. The patient requested that I obtain a Pap smear: No.  Kegel exercises discussed: Yes.  Proper diet and regular exercise were reviewed.  Annual mammograms recommended starting at age 38. Proper breast care was discussed.  Screening colonoscopy is recommended beginning at age 9.  Regular health maintenance was reviewed.  Sleep hygiene was discussed.  Adequate calcium and vitamin D intake was emphasized.  Leonard Schwartz M.D.    Regular Periods: no Mammogram: no  Monthly Breast Ex.: yes Exercise: yes  Tetanus < 10 years: yes Seatbelts: yes  NI. Bladder Functn.: yes Abuse at home: no  Daily BM's: yes Stressful Work: no  Healthy Diet: yes Sigmoid-Colonoscopy: 2013 WNL  Calcium:  yes Medical problems this year: c/o night sweats    LAST PAP: 09/26/07  Contraception: hyst  Mammogram:  03/29/10 WNL  PCP: Renford Dills  PMH: mild arthritis L knee receives Steroid injections and c/o frequent swelling mostly L leg PCP couldn't find a diagnosis   FMH:  No changes   Last Bone  Scan: never

## 2012-01-30 ENCOUNTER — Ambulatory Visit: Payer: 59 | Admitting: *Deleted

## 2012-01-31 ENCOUNTER — Ambulatory Visit: Payer: 59 | Admitting: *Deleted

## 2012-03-14 ENCOUNTER — Other Ambulatory Visit (HOSPITAL_COMMUNITY): Payer: Self-pay | Admitting: Internal Medicine

## 2012-03-14 ENCOUNTER — Ambulatory Visit (HOSPITAL_COMMUNITY)
Admission: RE | Admit: 2012-03-14 | Discharge: 2012-03-14 | Disposition: A | Discharge status: Home / Self Care | Payer: 59 | Source: Ambulatory Visit | Attending: Internal Medicine | Admitting: Internal Medicine

## 2012-03-14 DIAGNOSIS — R609 Edema, unspecified: Secondary | ICD-10-CM

## 2012-03-14 DIAGNOSIS — R0602 Shortness of breath: Secondary | ICD-10-CM

## 2012-03-14 NOTE — Progress Notes (Signed)
VASCULAR LAB PRELIMINARY  PRELIMINARY  PRELIMINARY  PRELIMINARY  Left lower extremity venous duplex completed.    Preliminary report:  Left:  No evidence of DVT, superficial thrombosis, or Baker's cyst.  SLAUGHTER, [place], RVS 03/14/2012, 11:23 AM

## 2012-03-15 ENCOUNTER — Emergency Department (HOSPITAL_COMMUNITY): Payer: 59

## 2012-03-15 ENCOUNTER — Encounter (HOSPITAL_COMMUNITY): Payer: Self-pay | Admitting: Emergency Medicine

## 2012-03-15 ENCOUNTER — Emergency Department (HOSPITAL_COMMUNITY)
Admission: EM | Admit: 2012-03-15 | Discharge: 2012-03-15 | Disposition: A | Discharge status: Home / Self Care | Payer: 59 | Attending: Emergency Medicine | Admitting: Emergency Medicine

## 2012-03-15 DIAGNOSIS — Z8639 Personal history of other endocrine, nutritional and metabolic disease: Secondary | ICD-10-CM | POA: Insufficient documentation

## 2012-03-15 DIAGNOSIS — M7989 Other specified soft tissue disorders: Secondary | ICD-10-CM | POA: Insufficient documentation

## 2012-03-15 DIAGNOSIS — Z862 Personal history of diseases of the blood and blood-forming organs and certain disorders involving the immune mechanism: Secondary | ICD-10-CM | POA: Insufficient documentation

## 2012-03-15 DIAGNOSIS — R0789 Other chest pain: Secondary | ICD-10-CM | POA: Insufficient documentation

## 2012-03-15 DIAGNOSIS — Z8742 Personal history of other diseases of the female genital tract: Secondary | ICD-10-CM | POA: Insufficient documentation

## 2012-03-15 DIAGNOSIS — Z8679 Personal history of other diseases of the circulatory system: Secondary | ICD-10-CM | POA: Insufficient documentation

## 2012-03-15 DIAGNOSIS — R079 Chest pain, unspecified: Secondary | ICD-10-CM

## 2012-03-15 DIAGNOSIS — K219 Gastro-esophageal reflux disease without esophagitis: Secondary | ICD-10-CM | POA: Insufficient documentation

## 2012-03-15 DIAGNOSIS — Z79899 Other long term (current) drug therapy: Secondary | ICD-10-CM | POA: Insufficient documentation

## 2012-03-15 DIAGNOSIS — R0602 Shortness of breath: Secondary | ICD-10-CM | POA: Insufficient documentation

## 2012-03-15 DIAGNOSIS — R110 Nausea: Secondary | ICD-10-CM | POA: Insufficient documentation

## 2012-03-15 DIAGNOSIS — Z8739 Personal history of other diseases of the musculoskeletal system and connective tissue: Secondary | ICD-10-CM | POA: Insufficient documentation

## 2012-03-15 DIAGNOSIS — Z9889 Other specified postprocedural states: Secondary | ICD-10-CM | POA: Insufficient documentation

## 2012-03-15 HISTORY — DX: Gastro-esophageal reflux disease without esophagitis: K21.9

## 2012-03-15 LAB — CBC WITH DIFFERENTIAL/PLATELET
Basophils Absolute: 0 10*3/uL (ref 0.0–0.1)
Basophils Relative: 0 % (ref 0–1)
Hemoglobin: 12.8 g/dL (ref 12.0–15.0)
Lymphocytes Relative: 33 % (ref 12–46)
MCHC: 34.1 g/dL (ref 30.0–36.0)
Monocytes Relative: 8 % (ref 3–12)
Neutro Abs: 3.6 10*3/uL (ref 1.7–7.7)
Neutrophils Relative %: 54 % (ref 43–77)
WBC: 6.7 10*3/uL (ref 4.0–10.5)

## 2012-03-15 LAB — COMPREHENSIVE METABOLIC PANEL
AST: 15 U/L (ref 0–37)
Albumin: 3.5 g/dL (ref 3.5–5.2)
Alkaline Phosphatase: 92 U/L (ref 39–117)
BUN: 9 mg/dL (ref 6–23)
CO2: 25 mEq/L (ref 19–32)
Chloride: 103 mEq/L (ref 96–112)
Potassium: 3.8 mEq/L (ref 3.5–5.1)
Total Bilirubin: 0.3 mg/dL (ref 0.3–1.2)

## 2012-03-15 MED ORDER — FENTANYL CITRATE 0.05 MG/ML IJ SOLN
50.0000 ug | Freq: Once | INTRAMUSCULAR | Status: AC
  Administered 2012-03-15: 50 ug via INTRAVENOUS
  Filled 2012-03-15: qty 2

## 2012-03-15 MED ORDER — ONDANSETRON HCL 4 MG/2ML IJ SOLN
4.0000 mg | Freq: Once | INTRAMUSCULAR | Status: AC
  Administered 2012-03-15: 4 mg via INTRAVENOUS
  Filled 2012-03-15: qty 2

## 2012-03-15 MED ORDER — IOHEXOL 350 MG/ML SOLN
100.0000 mL | Freq: Once | INTRAVENOUS | Status: AC | PRN
  Administered 2012-03-15: 100 mL via INTRAVENOUS

## 2012-03-15 NOTE — ED Provider Notes (Signed)
History     CSN: [id number]  Arrival date & time 03/15/12  0104   First MD Initiated Contact with Patient 03/15/12 [phone]      Chief Complaint  Patient presents with  . Chest Pain   HPI  History provided by the patient. Patient is a 42 year old female with past history of DVT, left knee surgery who presents with complaints of left lower extremity swelling as well as chest pain shortness of breath. Patient reports having several days of increasing left lower extremity swelling and pain. She was evaluated for this by her PCP which included ultrasound Doppler to rule out DVT. This was normal she reports continued swelling and pain is worse at the end of the days. She has some improvement after resting and sleeping. She has been trying to elevate her leg as often as possible. This evening she returned home from dinner with friends and while lying flat began to develop chest discomfort as well as shortness of breath. She's had some pain and soreness in the central and right upper chest area. Pain is sometimes worse with movements and position. Is also worse with deep breathing. She denies any cough, hemoptysis, lightheadedness, near syncope or syncopal episodes.    Past Medical History  Diagnosis Date  . Migraine   . Syncope, non cardiac   . DVT of lower extremity (deep venous thrombosis)   . Hypoglycemia   . Menorrhagia   . Dysmenorrhea   . Endometriosis   . Fibroids   . Adenomyosis   . 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 GERD (gastroesophageal reflux disease)     Past Surgical History  Procedure Laterality Date  . Vaginal hysterectomy    . Arthroscopic haglunds repair    . Cholecystectomy    . Laproscopic abdominal    . Esophagogastroduodenoscopy  05/02/2011    Procedure: ESOPHAGOGASTRODUODENOSCOPY (EGD);  Surgeon: Vertell Novak., MD;  Location: [facility];  Service: Endoscopy;  Laterality: N/A;  . Colonoscopy  05/02/2011    Procedure: COLONOSCOPY;   Surgeon: Vertell Novak., MD;  Location: [facility];  Service: Endoscopy;  Laterality: N/A;  . Cesarean section    . Laparoscopy      Family History  Problem Relation Age of Onset  . Colon cancer Father   . Colon cancer Mother   . Diabetes Maternal Uncle   . Hypertension Father   . Stroke Father   . Stroke Paternal Grandmother   . Heart disease Father   . Heart attack Father   . Heart attack Paternal Grandmother   . Heart attack Maternal Grandmother   . Heart attack Maternal Grandfather   . Cancer Maternal Uncle     History  Substance Use Topics  . Smoking status: Never Smoker   . Smokeless tobacco: Never Used  . Alcohol Use: No    OB History   Grav Para Term Preterm Abortions TAB SAB Ect Mult Living   2 2        2       Review of Systems  Constitutional: Negative for fever, chills and diaphoresis.  Respiratory: Positive for shortness of breath. Negative for wheezing and stridor.   Cardiovascular: Positive for chest pain and leg swelling. Negative for palpitations.  Gastrointestinal: Positive for nausea. Negative for vomiting, abdominal pain, diarrhea and constipation.  All other systems reviewed and are negative.    Allergies  Review of patient's allergies indicates no known allergies.  Home Medications  Current Outpatient Rx  Name  Route  Sig  Dispense  Refill  . calcium-vitamin D 250-100 MG-UNIT per tablet   Oral   Take 2 tablets by mouth daily.          . cetirizine (ZYRTEC) 10 MG tablet   Oral   Take 1 tablet (10 mg total) by mouth daily.   30 tablet   2   . olopatadine (PATANOL) 0.1 % ophthalmic solution   Both Eyes   Place 1 drop into both eyes daily as needed for allergies.         Marland Kitchen omeprazole (PRILOSEC) 40 MG capsule   Oral   Take 40 mg by mouth daily.         . sodium chloride (OCEAN) 0.65 % nasal spray   Nasal   Place 1 spray into the nose as needed for congestion.           BP 122/76  Pulse 72  Temp(Src) 98.1 F  (36.7 C) (Oral)  Resp 25  SpO2 99%  Physical Exam  Nursing note and vitals reviewed. Constitutional: She is oriented to person, place, and time. She appears well-developed and well-nourished. No distress.  HENT:  Head: Normocephalic.  Neck: Normal range of motion. Neck supple.  Cardiovascular: Normal rate and regular rhythm.   No murmur heard. Pulmonary/Chest: Effort normal and breath sounds normal. No stridor. No respiratory distress. She has no wheezes. She exhibits tenderness.  There is some reproducible right upper anterior chest wall tenderness to palpation. No deformities or crepitus.  Abdominal: Soft. There is no tenderness. There is no rebound and no guarding.  Musculoskeletal: She exhibits edema.  There is mild to moderate edema to the left lower extremity with mild tenderness to palpation. Normal and equal dorsal pedal pulses bilaterally. Normal cap refill. Normal sensation in feet bilaterally. Skin is unremarkable bilaterally. No erythema or rash.  Neurological: She is alert and oriented to person, place, and time.  Skin: Skin is warm and dry. No rash noted. No erythema.  Psychiatric: She has a normal mood and affect. Her behavior is normal.    ED Course  Procedures   Results for orders placed during the hospital encounter of 03/15/12  CBC WITH DIFFERENTIAL      Result Value Range   WBC 6.7  4.0 - 10.5 K/uL   RBC 3.88  3.87 - 5.11 MIL/uL   Hemoglobin 12.8  12.0 - 15.0 g/dL   HCT 14.7  82.9 - 56.2 %   MCV 96.6  78.0 - 100.0 fL   MCH 33.0  26.0 - 34.0 pg   MCHC 34.1  30.0 - 36.0 g/dL   RDW 13.0  86.5 - 78.4 %   Platelets 197  150 - 400 K/uL   Neutrophils Relative 54  43 - 77 %   Neutro Abs 3.6  1.7 - 7.7 K/uL   Lymphocytes Relative 33  12 - 46 %   Lymphs Abs 2.2  0.7 - 4.0 K/uL   Monocytes Relative 8  3 - 12 %   Monocytes Absolute 0.5  0.1 - 1.0 K/uL   Eosinophils Relative 4  0 - 5 %   Eosinophils Absolute 0.3  0.0 - 0.7 K/uL   Basophils Relative 0  0 - 1 %    Basophils Absolute 0.0  0.0 - 0.1 K/uL  COMPREHENSIVE METABOLIC PANEL      Result Value Range   Sodium 139  135 - 145 mEq/L   Potassium 3.8  3.5 - 5.1 mEq/L   Chloride 103  96 - 112 mEq/L   CO2 25  19 - 32 mEq/L   Glucose, Bld 100 (*) 70 - 99 mg/dL   BUN 9  6 - 23 mg/dL   Creatinine, Ser 7.84  0.50 - 1.10 mg/dL   Calcium 9.5  8.4 - 69.6 mg/dL   Total Protein 7.3  6.0 - 8.3 g/dL   Albumin 3.5  3.5 - 5.2 g/dL   AST 15  0 - 37 U/L   ALT 15  0 - 35 U/L   Alkaline Phosphatase 92  39 - 117 U/L   Total Bilirubin 0.3  0.3 - 1.2 mg/dL   GFR calc non Af Amer >90  >90 mL/min   GFR calc Af Amer >90  >90 mL/min  POCT I-STAT TROPONIN I      Result Value Range   Troponin i, poc 0.01  0.00 - 0.08 ng/mL   Comment 3               Dg Chest 2 View  03/15/2012  *RADIOLOGY REPORT*  Clinical Data: Right-sided chest pain  CHEST - 2 VIEW  Comparison: 10/24/2011  Findings: Lungs are clear. No pleural effusion or pneumothorax. The cardiomediastinal contours are within normal limits. The visualized bones and soft tissues are without significant appreciable abnormality.  IMPRESSION: No radiographic evidence of acute cardiopulmonary process.   Original Report Authenticated By: Jearld Lesch, M.D.      1. Chest pain   2. Leg swelling       MDM  Patient seen and evaluated. Patient currently resting comfortably appears in no acute distress or respiratory distress. She has normal respirations and O2 sats. Normal heart rate.  Patient continues to do well. CT scan slightly suboptimal but does not show any signs of PE at this time. Patient continues to have normal respirations, O2 sats and heart rate. She has reported a normal lower extremity Doppler ultrasounds. No new risk factors for DVT or PE. At this time patient felt stable to return home and continue close followup with PCP.      Angus Seller, PA-C 03/15/12 [phone]

## 2012-03-15 NOTE — ED Notes (Signed)
Discuss importance of scheduling a follow up appointment with patient.

## 2012-03-15 NOTE — ED Notes (Addendum)
Pt c/o of chest pain tonight upon going to bed tonight.  Pt states hx of blood clots.  She went to the MD yesterday and had a d dimer and doppler perform.  Tests were negative for blood clots.  Legs has been swollen since Monday after work. Slight edema of left ankle.  Pedal pulses is a +2 bilateral.

## 2012-03-15 NOTE — ED Notes (Signed)
PT. ARRIVED WITH EMS FROM HOME REPORTS RIGHT CHEST PAIN ONSET THIS EVENING , SLIGHT SOB WITH OCCASIONAL DRY COUGH , NO NAUSEA , PAIN WORSE WITH DEEP INSPIRATION . EKG - NSR BY EMS.

## 2012-03-16 NOTE — ED Provider Notes (Signed)
Medical screening examination/treatment/procedure(s) were performed by non-physician practitioner and as supervising physician I was immediately available for consultation/collaboration.  Sunnie Nielsen, MD 03/16/12 1032

## 2012-10-22 ENCOUNTER — Encounter (INDEPENDENT_AMBULATORY_CARE_PROVIDER_SITE_OTHER): Payer: Self-pay | Admitting: Surgery

## 2012-10-24 ENCOUNTER — Other Ambulatory Visit (HOSPITAL_COMMUNITY): Payer: Self-pay | Admitting: Internal Medicine

## 2012-10-24 ENCOUNTER — Ambulatory Visit (HOSPITAL_COMMUNITY)
Admission: RE | Admit: 2012-10-24 | Discharge: 2012-10-24 | Disposition: A | Discharge status: Home / Self Care | Payer: 59 | Source: Ambulatory Visit | Attending: Internal Medicine | Admitting: Internal Medicine

## 2012-10-24 ENCOUNTER — Other Ambulatory Visit: Payer: Self-pay

## 2012-10-24 ENCOUNTER — Encounter: Payer: Self-pay | Admitting: Internal Medicine

## 2012-10-24 DIAGNOSIS — R7989 Other specified abnormal findings of blood chemistry: Secondary | ICD-10-CM

## 2012-10-24 DIAGNOSIS — R52 Pain, unspecified: Secondary | ICD-10-CM

## 2012-10-24 DIAGNOSIS — R079 Chest pain, unspecified: Secondary | ICD-10-CM | POA: Insufficient documentation

## 2012-10-24 DIAGNOSIS — R0602 Shortness of breath: Secondary | ICD-10-CM | POA: Insufficient documentation

## 2012-10-24 DIAGNOSIS — Z9089 Acquired absence of other organs: Secondary | ICD-10-CM | POA: Insufficient documentation

## 2012-10-24 MED ORDER — IOHEXOL 350 MG/ML SOLN
100.0000 mL | Freq: Once | INTRAVENOUS | Status: AC | PRN
  Administered 2012-10-24: 100 mL via INTRAVENOUS

## 2012-10-25 ENCOUNTER — Encounter (INDEPENDENT_AMBULATORY_CARE_PROVIDER_SITE_OTHER): Payer: Self-pay | Admitting: Surgery

## 2012-10-25 ENCOUNTER — Telehealth (INDEPENDENT_AMBULATORY_CARE_PROVIDER_SITE_OTHER): Payer: Self-pay

## 2012-10-25 ENCOUNTER — Ambulatory Visit (INDEPENDENT_AMBULATORY_CARE_PROVIDER_SITE_OTHER): Payer: Commercial Managed Care - PPO | Admitting: Surgery

## 2012-10-25 ENCOUNTER — Ambulatory Visit (INDEPENDENT_AMBULATORY_CARE_PROVIDER_SITE_OTHER): Payer: 59 | Admitting: Surgery

## 2012-10-25 VITALS — BP 116/72 | HR 68 | Temp 97.4°F | Resp 14 | Ht 67.5 in | Wt 212.2 lb

## 2012-10-25 DIAGNOSIS — N6089 Other benign mammary dysplasias of unspecified breast: Secondary | ICD-10-CM

## 2012-10-25 DIAGNOSIS — N6081 Other benign mammary dysplasias of right breast: Secondary | ICD-10-CM

## 2012-10-25 DIAGNOSIS — N63 Unspecified lump in unspecified breast: Secondary | ICD-10-CM

## 2012-10-25 DIAGNOSIS — N6310 Unspecified lump in the right breast, unspecified quadrant: Secondary | ICD-10-CM

## 2012-10-25 HISTORY — DX: Other benign mammary dysplasias of unspecified breast: N60.89

## 2012-10-25 NOTE — Telephone Encounter (Signed)
Pt calling b/c her diagnostic mgm and u/s is not scheduled till [facility]06/[facility]. The pt has been put on a cancelation list for a sooner appt but the [facility] told the pt that Dr Jamey Ripa could call the [facility] to tell them the pt needs to be seen sooner than 11/4 then they would try to move her appt up sooner if they could. I told the pt that I would send Dr Jamey Ripa a message along to [place].

## 2012-10-25 NOTE — Progress Notes (Signed)
NAME: Karen [name] DOB: 11-07-1970 MRN: [id number]                                                                                      DATE: 10/25/2012  PCP: Katy Apo, MD Referring Provider: Katy Apo, MD  IMPRESSION:  Right breast mass, lower inner quadrant, probable sebaceous cyst Right breast discomfort, no evidence of other abnormality by examination; due a mammogram. History of lower extremity DVT  PLAN:   I think this is a benign epidermoid cyst in her breast. However she's also having some breast discomfort and has not had a mammogram for about 2 years. I think before we make any decisions we should pain a mammogram and ultrasound. I believe the cyst likely should be removed as has his been recently inflamed. However, would like confirmation by ultrasound this does not represent something directly related to her breast instead of skin.                 CC:  Chief Complaint  Patient presents with  . Epidermal Cyst    HPI:  Karen [name] is a 42 y.o.  female who presents for evaluation of A. Sebaceous cyst of the right breast. This recently appeared closer to the surface of the skin and it seems to have diminished a little bit. It is not painful. It's been present for a while and perhaps slowly enlarging. She's also developed a discomfort in her right breast. This is not well localized she has not felt any other lumps or masses in either breast. She has not had a mammogram since March of 2012. She's been trying to arrange one currently but was unable to get it scheduled prior to today's visit. Of note is that she's had a DVT in her lower extremities about 20 years ago. She did have a CT angiogram yesterday which was negative for PE. She has no family history of breast or ovarian cancer.  PMH:  has a past medical history of Migraine; Syncope, non cardiac; DVT of lower extremity (deep venous thrombosis); Hypoglycemia; Menorrhagia; Dysmenorrhea; Endometriosis; Fibroids;  Adenomyosis; Arthritis (2013); and GERD (gastroesophageal reflux disease).  PSH:   has past surgical history that includes Vaginal hysterectomy; Arthroscopic haglunds repair; Cholecystectomy; laproscopic abdominal; Esophagogastroduodenoscopy (05/02/2011); Colonoscopy (05/02/2011); Cesarean section; laparoscopy; and Knee surgery.  ALLERGIES:  No Known Allergies  MEDICATIONS: Current outpatient prescriptions:calcium-vitamin D 250-100 MG-UNIT per tablet, Take 2 tablets by mouth daily. , Disp: , Rfl: ;  cetirizine (ZYRTEC) 10 MG tablet, Take 1 tablet (10 mg total) by mouth daily., Disp: 30 tablet, Rfl: 2;  dexlansoprazole (DEXILANT) 60 MG capsule, Take 60 mg by mouth daily., Disp: , Rfl: ;  sodium chloride (OCEAN) 0.65 % nasal spray, Place 1 spray into the nose as needed for congestion., Disp: , Rfl:  olopatadine (PATANOL) 0.1 % ophthalmic solution, Place 1 drop into both eyes daily as needed for allergies., Disp: , Rfl: ;  omeprazole (PRILOSEC) 40 MG capsule, Take 40 mg by mouth daily., Disp: , Rfl:   ROS: She has filled out our 12 point review of systems and it is negative except for some cough chest pain  or leg swelling. EXAM:   VITAL SIGNS:  BP 116/72  Pulse 68  Temp(Src) 97.4 F (36.3 C)  Resp 14  Ht 5' 7.5" (1.715 m)  Wt 212 lb 3.2 oz (96.253 kg)  BMI 32.73 kg/m2  GENERAL:  The patient is alert, oriented, and generally healthy-appearing, NAD. Mood and affect are normal.    BREASTS:  the breasts are fairly large, soft, and there is no obvious palpable mass the exception of what appears to be an epidermoid cyst measuring approximately 4 cm x 2 cm located near the lower inner quadrant medial aspect of her right breast. It is not tender. There is no other breast abnormality on either side.  LYMPHATICS: there is no axillary or supraclavicular adenopathy on either side.   DATA REVIEWED:  I reviewed the notes from her primary care physician. I've also reviewed information and laboratory  studies etc. That are in electronic medical record.    Ilai Hiller J 10/25/2012  CC; Dr Lonzo Cloud

## 2012-10-25 NOTE — Patient Instructions (Signed)
We will arrange a mammogram and ultrasound to evaluate the right breast pain and the lump in the right breast. Once that is done we can make a decision about the breast lump

## 2012-10-26 ENCOUNTER — Emergency Department (HOSPITAL_COMMUNITY): Payer: 59

## 2012-10-26 ENCOUNTER — Telehealth (INDEPENDENT_AMBULATORY_CARE_PROVIDER_SITE_OTHER): Payer: Self-pay | Admitting: *Deleted

## 2012-10-26 ENCOUNTER — Other Ambulatory Visit (INDEPENDENT_AMBULATORY_CARE_PROVIDER_SITE_OTHER): Payer: Self-pay | Admitting: Surgery

## 2012-10-26 ENCOUNTER — Ambulatory Visit
Admission: RE | Admit: 2012-10-26 | Discharge: 2012-10-26 | Disposition: A | Discharge status: Home / Self Care | Payer: 59 | Source: Ambulatory Visit | Attending: Surgery | Admitting: Surgery

## 2012-10-26 ENCOUNTER — Other Ambulatory Visit (INDEPENDENT_AMBULATORY_CARE_PROVIDER_SITE_OTHER): Payer: Self-pay | Admitting: *Deleted

## 2012-10-26 ENCOUNTER — Inpatient Hospital Stay: Admission: RE | Admit: 2012-10-26 | Payer: 59 | Source: Ambulatory Visit

## 2012-10-26 ENCOUNTER — Encounter (HOSPITAL_COMMUNITY): Payer: Self-pay | Admitting: Emergency Medicine

## 2012-10-26 ENCOUNTER — Emergency Department (HOSPITAL_COMMUNITY)
Admission: EM | Admit: 2012-10-26 | Discharge: 2012-10-26 | Disposition: A | Discharge status: Home / Self Care | Payer: 59 | Attending: Emergency Medicine | Admitting: Emergency Medicine

## 2012-10-26 DIAGNOSIS — N6310 Unspecified lump in the right breast, unspecified quadrant: Secondary | ICD-10-CM

## 2012-10-26 DIAGNOSIS — Z8739 Personal history of other diseases of the musculoskeletal system and connective tissue: Secondary | ICD-10-CM | POA: Insufficient documentation

## 2012-10-26 DIAGNOSIS — R079 Chest pain, unspecified: Secondary | ICD-10-CM

## 2012-10-26 DIAGNOSIS — R0789 Other chest pain: Secondary | ICD-10-CM | POA: Insufficient documentation

## 2012-10-26 DIAGNOSIS — Z8679 Personal history of other diseases of the circulatory system: Secondary | ICD-10-CM | POA: Insufficient documentation

## 2012-10-26 DIAGNOSIS — Z8742 Personal history of other diseases of the female genital tract: Secondary | ICD-10-CM | POA: Insufficient documentation

## 2012-10-26 DIAGNOSIS — Z86718 Personal history of other venous thrombosis and embolism: Secondary | ICD-10-CM | POA: Insufficient documentation

## 2012-10-26 DIAGNOSIS — K219 Gastro-esophageal reflux disease without esophagitis: Secondary | ICD-10-CM | POA: Insufficient documentation

## 2012-10-26 LAB — BASIC METABOLIC PANEL
CO2: 27 mEq/L (ref 19–32)
Chloride: 101 mEq/L (ref 96–112)
Glucose, Bld: 101 mg/dL — ABNORMAL HIGH (ref 70–99)
Sodium: 138 mEq/L (ref 135–145)

## 2012-10-26 LAB — CBC
Hemoglobin: 13.3 g/dL (ref 12.0–15.0)
MCH: 33 pg (ref 26.0–34.0)
Platelets: 180 10*3/uL (ref 150–400)
RBC: 4.03 MIL/uL (ref 3.87–5.11)
WBC: 5.5 10*3/uL (ref 4.0–10.5)

## 2012-10-26 LAB — POCT I-STAT TROPONIN I: Troponin i, poc: 0.01 ng/mL (ref 0.00–0.08)

## 2012-10-26 LAB — TROPONIN I: Troponin I: <0.3 ng/mL (ref <0.30)

## 2012-10-26 MED ORDER — NITROGLYCERIN 0.4 MG SL SUBL
0.4000 mg | SUBLINGUAL_TABLET | SUBLINGUAL | Status: DC | PRN
  Administered 2012-10-26: 0.4 mg via SUBLINGUAL

## 2012-10-26 NOTE — Addendum Note (Signed)
Addended byLiliana Cline on: 10/26/2012 08:48 AM   Modules accepted: Orders

## 2012-10-26 NOTE — ED Notes (Signed)
Pain in chest increases with moving left arm and palpation of chest, and deep breathing. Pain relieved by laying down.

## 2012-10-26 NOTE — ED Provider Notes (Signed)
Medical screening examination/treatment/procedure(s) were performed by non-physician practitioner and as supervising physician I was immediately available for consultation/collaboration.  EKG Interpretation     Ventricular Rate:  75 PR Interval:  168 QRS Duration: 84 QT Interval:  364 QTC Calculation: 406 R Axis:   16 Text Interpretation:  Normal sinus rhythm Normal ECG since last tracing no significant change              Rolan Bucco, MD 10/26/12 1548

## 2012-10-26 NOTE — Telephone Encounter (Signed)
Pt called wanting [place] to fax an order for her [place] and MM to [facility] because she made an appt with [facility] for 10/30/12 which was sooner than she would be seen at breast center.  I spoke with Dr. Jamey Ripa and the order was changed and I faxed the new order to [facility] and cancelled appt with breast center.  Pt made aware.

## 2012-10-26 NOTE — Telephone Encounter (Signed)
Patient called and made a sooner appt at [facility] breast imaging center for Tuesday, 10/30/2012 at 1:45pm.

## 2012-10-26 NOTE — ED Notes (Signed)
Onset one hour ago chest pain 7/10 achy pressure radiating to left arm. CT scan of chest completed 2 days ago at [facility]

## 2012-10-26 NOTE — ED Provider Notes (Signed)
CSN: [id number]     Arrival date & time 10/26/12  1104 History   First MD Initiated Contact with Patient 10/26/12 1140     Chief Complaint  Patient presents with  . Chest Pain   Patient is a 42 y.o. female presenting with chest pain.  Chest Pain   Karen [name] is a 42 year old overweight, nonsmoker female with a remote history of a LLE DVT who presents with chest pain that started 2 hours prior to admission to the ED. The pain is described as dull, achy and constant. She had radiation of pain and tingling down her left arm which is resolving. Her pain on first arrival to the ED was 7/10 and is now a 5/10 two hours after onset of pain. She had one episode of sharp, stabbing pain to her back that has resolved. No diaphoresis during the episode but does endorse nausea and slight headache. Laying down has made the pain better; deep breaths and changes in positions make the pain worse. She denies fever, chills, SOB, or cough. She has swelling in her left lower leg but is unchanged from her normal swelling in that leg. Family history is positive for dad with MI at 81 and mom with angina.   She has recently been seen by her PCP for an infected cyst on her right breast. She has noted more cysts and pain in her chest wall around the cyst in the past week. She had a CXR and CTA on 10/22 due to the increased pain . Both studies were negative. She is scheduled for a breast mammogram and [place] today for further evaluation of the masses.    Past Medical History  Diagnosis Date  . Migraine   . Syncope, non cardiac   . DVT of lower extremity (deep venous thrombosis)   . Menorrhagia   . Dysmenorrhea   . Endometriosis   . Fibroids   . Adenomyosis   . 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 GERD (gastroesophageal reflux disease)    Past Surgical History  Procedure Laterality Date  . Vaginal hysterectomy    . Arthroscopic haglunds repair    . Cholecystectomy    . Laproscopic abdominal     . Esophagogastroduodenoscopy  05/02/2011    Procedure: ESOPHAGOGASTRODUODENOSCOPY (EGD);  Surgeon: Vertell Novak., MD;  Location: [facility];  Service: Endoscopy;  Laterality: N/A;  . Colonoscopy  05/02/2011    Procedure: COLONOSCOPY;  Surgeon: Vertell Novak., MD;  Location: [facility];  Service: Endoscopy;  Laterality: N/A;  . Cesarean section    . Laparoscopy    . Knee surgery     Family History  Problem Relation Age of Onset  . Colon cancer Father   . Colon cancer Mother   . Diabetes Maternal Uncle   . Hypertension Father   . Stroke Father   . Stroke Paternal Grandmother   . Heart disease Father   . Heart attack Father   . Heart attack Paternal Grandmother   . Heart attack Maternal Grandmother   . Heart attack Maternal Grandfather   . Cancer Maternal Uncle    History  Substance Use Topics  . Smoking status: Never Smoker   . Smokeless tobacco: Never Used  . Alcohol Use: Yes     Comment: occasionally   OB History   Grav Para Term Preterm Abortions TAB SAB Ect Mult Living   2 2  2     Review of Systems  Cardiovascular: Positive for chest pain.    Allergies  Review of patient's allergies indicates no known allergies.  Home Medications   Current Outpatient Rx  Name  Route  Sig  Dispense  Refill  . calcium-vitamin D 250-100 MG-UNIT per tablet   Oral   Take 1 tablet by mouth daily.          . cetirizine (ZYRTEC) 10 MG tablet   Oral   Take 1 tablet (10 mg total) by mouth daily.   30 tablet   2   . dexlansoprazole (DEXILANT) 60 MG capsule   Oral   Take 60 mg by mouth daily.         . Probiotic Product (PROBIOTIC PO)   Oral   Take 1 capsule by mouth 4 (four) times daily as needed (for GI upset).         . sodium chloride (OCEAN) 0.65 % nasal spray   Nasal   Place 1 spray into the nose as needed for congestion.          BP 108/80  Pulse 72  Temp(Src) 98.5 F (36.9 C) (Oral)  Resp 22  Ht 5\' 7"  (1.702 m)  Wt 207 lb (93.895  kg)  BMI 32.41 kg/m2  SpO2 97% Physical Exam  Constitutional: She is oriented to person, place, and time. Vital signs are normal. She appears well-developed and well-nourished. No distress.  Cardiovascular: Normal rate, regular rhythm, S1 normal and S2 normal.  Exam reveals no friction rub.   Pulmonary/Chest: Effort normal and breath sounds normal. She exhibits tenderness (over lower sternum, especially over small mass on R breast. No chest wall tenderness on left side). She exhibits no edema. Right breast exhibits mass (under right breast fold near midline of chest, 2 cm lesion along with several scattered cystic lesions on right breast) and tenderness. Right breast exhibits no nipple discharge. Left breast exhibits no mass, no nipple discharge and no tenderness. Breasts are symmetrical.  Musculoskeletal:       Right shoulder: Normal.       Left shoulder: She exhibits normal range of motion, no tenderness, no swelling and normal strength.       Right ankle: She exhibits no swelling and normal pulse.       Left ankle: She exhibits swelling (trace edema). She exhibits normal pulse.       Right lower leg: She exhibits no tenderness and no edema.       Left lower leg: She exhibits edema (trace). She exhibits no tenderness.  Neurological: She is alert and oriented to person, place, and time.  Skin: Skin is warm and dry. She is not diaphoretic.  Psychiatric: Her mood appears anxious.    ED Course  Procedures (including critical care time) Labs Review Labs Reviewed  BASIC METABOLIC PANEL - Abnormal; Notable for the following:    Glucose, Bld 101 (*)    All other components within normal limits  CBC  TROPONIN I  POCT I-STAT TROPONIN I   Imaging Review Ct Angio Chest Pe W/cm &/or Wo Cm  10/24/2012   CLINICAL DATA:  Short of breath  EXAM: CT ANGIOGRAPHY CHEST WITH CONTRAST  TECHNIQUE: Multidetector CT imaging of the chest was performed using the standard protocol during bolus administration  of intravenous contrast. Multiplanar CT image reconstructions including MIPs were obtained to evaluate the vascular anatomy.  CONTRAST:  OMNIPAQUE IOHEXOL 350 MG/ML SOLN  COMPARISON:  10/24/2012  FINDINGS:  Negative for pulmonary embolism. Lungs are clear without infiltrate or effusion. Negative for mass or adenopathy. Mild degenerative change in the thoracic spine without acute bony abnormality.  Review of the MIP images confirms the above findings.  IMPRESSION: Negative for pulmonary embolism. No acute abnormality.   Electronically Signed   By: Marlan Palau M.D.   On: 10/24/2012 18:28   Dg Chest Portable 1 View  10/26/2012   CLINICAL DATA:  Chest pain  EXAM: PORTABLE CHEST - 1 VIEW  COMPARISON:  Chest x-ray and CT PE study 10/24/2012  FINDINGS: The lungs are clear and negative for focal airspace consolidation, pulmonary edema or suspicious pulmonary nodule. No pleural effusion or pneumothorax. Cardiac and mediastinal contours are within normal limits. No acute fracture or lytic or blastic osseous lesions. The visualized upper abdominal bowel gas pattern is unremarkable.  IMPRESSION: No active cardiopulmonary disease.   Electronically Signed   By: Malachy Moan M.D.   On: 10/26/2012 13:01    EKG Interpretation     Ventricular Rate:  75 PR Interval:  168 QRS Duration: 84 QT Interval:  364 QTC Calculation: 406 R Axis:   16 Text Interpretation:  Normal sinus rhythm Normal ECG since last tracing no significant change             MDM  No diagnosis found. 1. Chest pain  Chest pain, left chest with arm involvement, risk factor of family history premature CAD, but negative troponin x 2, normal EKG, no continuous pain. Feel stable for discharge with cardiology referral (prefers Dr. Katrinka Blazing) for outpatient stress. Return precautions given.    Arnoldo Hooker, PA-C 10/26/12 1517

## 2012-10-31 ENCOUNTER — Ambulatory Visit (INDEPENDENT_AMBULATORY_CARE_PROVIDER_SITE_OTHER): Payer: Commercial Managed Care - PPO | Admitting: Surgery

## 2012-10-31 ENCOUNTER — Encounter (INDEPENDENT_AMBULATORY_CARE_PROVIDER_SITE_OTHER): Payer: Self-pay | Admitting: Surgery

## 2012-10-31 VITALS — BP 118/90 | HR 80 | Temp 98.4°F | Resp 15 | Ht 67.5 in | Wt 213.0 lb

## 2012-10-31 DIAGNOSIS — N6089 Other benign mammary dysplasias of unspecified breast: Secondary | ICD-10-CM

## 2012-10-31 DIAGNOSIS — N6081 Other benign mammary dysplasias of right breast: Secondary | ICD-10-CM

## 2012-10-31 NOTE — Patient Instructions (Signed)
followup with me next week after you have a repeat ultrasound of your breast

## 2012-10-31 NOTE — Progress Notes (Signed)
She comes in for followup. I saw her last week for what appeared to be a sebaceous cyst at the edge of the right breast lower inner quadrant. She is also having some breast symptoms so we requested an ultrasound and mammogram. She did develop some chest pain and was seen in the ED and a cardiac workup was negative although a followup with the cardiologist is now scheduled.  Since I saw her she has also developed some tiny cystic areas in a radial fashion in the right breast and some more in the left. These are 1-2 mm in size. She's not had any further discomfort in the site of the epidermoid cyst but continues to have some issues with chest pain. She's been told this might be costochondritis. She has a follow up ultrasound next week.  On exam she continues to have what appears to be a complex irregular sebaceous cyst in the breast. Unchanged from last week. However now there are 32 mm cystic dermal nodules in the right breast in about the 9:00 position and a radial fashion. There is a similar 1 and about the same area on the left.  Data reviewed: I did look at the ultrasound and mammogram reports as well as the notes and intake from her other visits.  Impression: Epidermoid cyst of chest 5 cm probably recently inflamed; question tiny additional epidermoid skin cyst developing of uncertain etiology and not clear Y. These are developing; chest pain with further cardiac evaluation with cardiologist pending  Plan: Since this is not acutely infected I think we can defer any surgical intervention although given its size and the fact she's had inflammation of the excision at some point would be optimal and I think it will require some IV sedation to do. She will contact me again next week after she has a followup ultrasound to see if there is any improvement in this area or any change. 

## 2012-11-03 HISTORY — PX: BREAST EXCISIONAL BIOPSY: SUR124

## 2012-11-06 ENCOUNTER — Other Ambulatory Visit: Payer: 59

## 2012-11-07 ENCOUNTER — Ambulatory Visit
Admission: RE | Admit: 2012-11-07 | Discharge: 2012-11-07 | Disposition: A | Discharge status: Home / Self Care | Payer: 59 | Source: Ambulatory Visit | Attending: Surgery | Admitting: Surgery

## 2012-11-07 DIAGNOSIS — N6310 Unspecified lump in the right breast, unspecified quadrant: Secondary | ICD-10-CM

## 2012-11-08 ENCOUNTER — Telehealth (INDEPENDENT_AMBULATORY_CARE_PROVIDER_SITE_OTHER): Payer: Self-pay | Admitting: General Surgery

## 2012-11-08 NOTE — Telephone Encounter (Signed)
Looks like patient got her results from [facility] but wants your opinion. Please advise.

## 2012-11-08 NOTE — Telephone Encounter (Signed)
She has an inflamed epidermoid cyst, and if it becomes symptomatic enough, we may need to drain in office. We were trying to get a trip to the OR to excise completely,as too big to remove entirely under local. We can see her in the office to see if an I&D needs to  Be done.

## 2012-11-08 NOTE — Telephone Encounter (Signed)
Appt made for follow up in the morning.

## 2012-11-08 NOTE — Telephone Encounter (Signed)
Pt calling to see if Dr Jamey Ripa had made a decision after reviewing the u/s from yesterday. The pt wants Dr Jamey Ripa to know also that she feels the area is getting warm to touch she is not running fevers but the area has doubled in size from last week.. The pt has an appt with a cardiologist Dr Verdis Prime on 11/19/12 and now the pt is thinking she needs that appt moved up. The pt has tried calling Dr Michaelle Copas office to move the appt up but not having any luck so she wants you to call and try to move the cardiology appt up. Please advise.

## 2012-11-08 NOTE — Telephone Encounter (Signed)
Message copied by Liliana Cline on Thu Nov 08, 2012  8:42 AM ------      Message from: Parks Neptune      Created: Wed Nov 07, 2012  4:18 PM      Regarding: Requesting Ultra Sound Results      Contact: [phone]       Patient is requesting for Dr. Jamey Ripa to call her as soon as he reviews her results/give her a call. Extremely anxious to know what is the next step.  DF  ------

## 2012-11-09 ENCOUNTER — Ambulatory Visit (INDEPENDENT_AMBULATORY_CARE_PROVIDER_SITE_OTHER): Payer: Commercial Managed Care - PPO | Admitting: Surgery

## 2012-11-09 ENCOUNTER — Encounter: Payer: Self-pay | Admitting: Internal Medicine

## 2012-11-09 ENCOUNTER — Encounter (INDEPENDENT_AMBULATORY_CARE_PROVIDER_SITE_OTHER): Payer: Self-pay | Admitting: Surgery

## 2012-11-09 ENCOUNTER — Ambulatory Visit (INDEPENDENT_AMBULATORY_CARE_PROVIDER_SITE_OTHER): Payer: 59 | Admitting: Internal Medicine

## 2012-11-09 VITALS — BP 112/70 | HR 81 | Ht 67.5 in | Wt 214.0 lb

## 2012-11-09 VITALS — BP 122/78 | HR 70 | Temp 97.3°F | Resp 16 | Ht 67.0 in | Wt 214.4 lb

## 2012-11-09 DIAGNOSIS — R079 Chest pain, unspecified: Secondary | ICD-10-CM

## 2012-11-09 DIAGNOSIS — N6089 Other benign mammary dysplasias of unspecified breast: Secondary | ICD-10-CM

## 2012-11-09 DIAGNOSIS — N6081 Other benign mammary dysplasias of right breast: Secondary | ICD-10-CM

## 2012-11-09 NOTE — Patient Instructions (Signed)
After we hear from your cardiologist, we can schedule outpatient surgery to remove the sebaceous cyst from the medial part of your right breast

## 2012-11-09 NOTE — Progress Notes (Signed)
NAME: Karen [name]       DOB: 17-May-1970           DATE: 11/09/2012       NWG:[id number]  CC:  Chief Complaint  Patient presents with  . Routine Post Op    reck chest cyst    HPI: this patient comes back for a followup of her sebaceous cyst on the very lower inner quadrant of her right breast. She thinks it's gotten a little bit red since I saw her last week. A followup ultrasound showed no change. She has multiple other tiny sebaceous cyst on the breast, confirmed by the most recent ultrasound. She also continues to have pain which is in her chest going through to her back. Her cardiology evaluation this afternoon. She thinks perhaps the cysts have caused some of the pain.  EXAM: Vital signs: BP 122/78  Pulse 70  Temp(Src) 97.3 F (36.3 C) (Temporal)  Resp 16  Ht 5\' 7"  (1.702 m)  Wt 214 lb 6.4 oz (97.251 kg)  BMI 33.57 kg/m2  General: Patient alert, oriented, NAD  Breasts: There remains a approximate 2.5 x 4.5 complex epidermoid cyst right at the medial aspect of her breast almost overlying the sternum. There are several other tiny epidermoid cyst primarily right breast but some in the left. Note these appear to be actively infected although clearly the one in the sternum has had some tenderness in perhaps has been inflamed. IMP: multiple epidermoid cysts of the breast, with one located very medially having had some symptoms and concern for inflammation although no active abscess  PLAN: I discussed surgical excision of the large cyst as I think this is causing problems and more likely to become infected. The remaining ones should be left alone. Depending on her cardiology consultation today we can make plans. This could be done as an outpatient but will require some anesthesia based on its size and location. I discussed at length with the patient. All questions answered. I told her I do not think her chest pain is from the cyst.  Glyn Gerads J 11/09/2012

## 2012-11-09 NOTE — Patient Instructions (Signed)
Will obtain lipid panel from Dr.Polite's office     Call back to schedule Cardio Pulmonary Stress Test

## 2012-11-09 NOTE — Progress Notes (Signed)
HPI Patient is a 42 yo who presents for evaluation of CP and SOB  The patient is being evaluated by C Streck for removal of sebaceous cysts on chest  SHe told him about SSCP that she is having ON describing pain it occurs with and without activity.  COmes and goes  Can be sharp   She also has some SOB with activity that is chronic.   She had aon echo done in the past (2013)  This showed normal LV systolic function with mild diastolic dysfunction.   No Known Allergies  Current Outpatient Prescriptions  Medication Sig Dispense Refill  . calcium-vitamin D 250-100 MG-UNIT per tablet Take 1 tablet by mouth daily.       . cetirizine (ZYRTEC) 10 MG tablet Take 1 tablet (10 mg total) by mouth daily.  30 tablet  2  . dexlansoprazole (DEXILANT) 60 MG capsule Take 60 mg by mouth daily.      Marland Kitchen ibuprofen (ADVIL,MOTRIN) 200 MG tablet Take 200 mg by mouth every 6 (six) hours as needed.      . Probiotic Product (PROBIOTIC PO) Take 1 capsule by mouth 4 (four) times daily as needed (for GI upset).      . sodium chloride (OCEAN) 0.65 % nasal spray Place 1 spray into the nose as needed for congestion.       No current facility-administered medications for this visit.    Past Medical History  Diagnosis Date  . Migraine   . Syncope, non cardiac   . DVT of lower extremity (deep venous thrombosis)   . Menorrhagia   . Dysmenorrhea   . Endometriosis   . Fibroids   . Adenomyosis   . 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 GERD (gastroesophageal reflux disease)     Past Surgical History  Procedure Laterality Date  . Vaginal hysterectomy    . Arthroscopic haglunds repair    . Cholecystectomy    . Laproscopic abdominal    . Esophagogastroduodenoscopy  05/02/2011    Procedure: ESOPHAGOGASTRODUODENOSCOPY (EGD);  Surgeon: Vertell Novak., MD;  Location: [facility];  Service: Endoscopy;  Laterality: N/A;  . Colonoscopy  05/02/2011    Procedure: COLONOSCOPY;  Surgeon: Vertell Novak.,  MD;  Location: [facility];  Service: Endoscopy;  Laterality: N/A;  . Cesarean section    . Laparoscopy    . Knee surgery      Family History  Problem Relation Age of Onset  . Colon cancer Father   . Hypertension Father   . Stroke Father   . Heart disease Father   . Heart attack Father   . Colon cancer Mother   . Diabetes Maternal Uncle   . Stroke Paternal Grandmother   . Heart attack Paternal Grandmother   . Heart attack Maternal Grandmother   . Heart attack Maternal Grandfather   . Cancer Maternal Uncle     History   Social History  . Marital Status: Married    Spouse Name: N/A    Number of Children: N/A  . Years of Education: N/A   Occupational History  . Not on file.   Social History Main Topics  . Smoking status: Never Smoker   . Smokeless tobacco: Never Used  . Alcohol Use: Yes     Comment: occasionally  . Drug Use: No  . Sexual Activity: Not on file     Comment: married    Other Topics Concern  . Not on file  Social History Narrative  . No narrative on file    Review of Systems:  All systems reviewed.  They are negative to the above problem except as previously stated.  Vital Signs: BP 112/70  Pulse 81  Ht 5' 7.5" (1.715 m)  Wt 214 lb (97.07 kg)  BMI 33.00 kg/m2  Physical Exam  HEENT:  Normocephalic, atraumatic. EOMI, PERRLA.  Neck: JVP is normal.  No bruits.  Lungs: clear to auscultation. No rales no wheezes.  Chest:  Sebaceous cysts R breast  Tender to palpation above sternum   Heart: Regular rate and rhythm. Normal S1, S2. No S3.   No significant murmurs. PMI not displaced.  Abdomen:  Supple, nontender. Normal bowel sounds. No masses. No hepatomegaly.  Extremities:   Good distal pulses throughout. No lower extremity edema.  Musculoskeletal :moving all extremities.  Neuro:   alert and oriented x3.  CN II-XII grossly intact.  EKG  SR 81 bpm   Assessment and Plan:  1.  CP  I do not think it is cardiac in origin  Appears mor  musculoskeletal.  Follow  From cardiac standpoint I think she is OK to proceed with planned surgical procedure.  2.  SOB  After surgery will set up for a cardiopulmonary stress test.

## 2012-11-12 ENCOUNTER — Telehealth (INDEPENDENT_AMBULATORY_CARE_PROVIDER_SITE_OTHER): Payer: Self-pay | Admitting: General Surgery

## 2012-11-12 NOTE — Telephone Encounter (Signed)
Surgery orders given to scheduling department.

## 2012-11-13 ENCOUNTER — Ambulatory Visit: Payer: 59 | Admitting: Interventional Cardiology

## 2012-11-14 ENCOUNTER — Ambulatory Visit: Payer: 59 | Admitting: Cardiology

## 2012-11-19 ENCOUNTER — Encounter: Payer: 59 | Admitting: Interventional Cardiology

## 2012-11-19 ENCOUNTER — Encounter (HOSPITAL_BASED_OUTPATIENT_CLINIC_OR_DEPARTMENT_OTHER): Payer: Self-pay | Admitting: *Deleted

## 2012-11-19 NOTE — Progress Notes (Signed)
No labs needed

## 2012-11-21 ENCOUNTER — Encounter (HOSPITAL_BASED_OUTPATIENT_CLINIC_OR_DEPARTMENT_OTHER): Payer: 59 | Admitting: Anesthesiology

## 2012-11-21 ENCOUNTER — Ambulatory Visit (HOSPITAL_BASED_OUTPATIENT_CLINIC_OR_DEPARTMENT_OTHER): Payer: 59 | Admitting: Anesthesiology

## 2012-11-21 ENCOUNTER — Encounter (HOSPITAL_BASED_OUTPATIENT_CLINIC_OR_DEPARTMENT_OTHER): Payer: Self-pay | Admitting: *Deleted

## 2012-11-21 ENCOUNTER — Encounter (HOSPITAL_BASED_OUTPATIENT_CLINIC_OR_DEPARTMENT_OTHER)
Admission: RE | Disposition: A | Discharge status: Home / Self Care | Payer: Self-pay | Source: Ambulatory Visit | Attending: Surgery

## 2012-11-21 ENCOUNTER — Ambulatory Visit (HOSPITAL_BASED_OUTPATIENT_CLINIC_OR_DEPARTMENT_OTHER)
Admission: RE | Admit: 2012-11-21 | Discharge: 2012-11-21 | Disposition: A | Discharge status: Home / Self Care | Payer: 59 | Source: Ambulatory Visit | Attending: Surgery | Admitting: Surgery

## 2012-11-21 DIAGNOSIS — N6081 Other benign mammary dysplasias of right breast: Secondary | ICD-10-CM

## 2012-11-21 DIAGNOSIS — N6009 Solitary cyst of unspecified breast: Secondary | ICD-10-CM

## 2012-11-21 DIAGNOSIS — K219 Gastro-esophageal reflux disease without esophagitis: Secondary | ICD-10-CM | POA: Insufficient documentation

## 2012-11-21 DIAGNOSIS — L723 Sebaceous cyst: Secondary | ICD-10-CM | POA: Insufficient documentation

## 2012-11-21 HISTORY — PX: BREAST CYST EXCISION: SHX579

## 2012-11-21 SURGERY — EXCISION, CYST, BREAST
Anesthesia: General | Site: Breast | Laterality: Right | Wound class: Clean Contaminated

## 2012-11-21 MED ORDER — CHLORHEXIDINE GLUCONATE 4 % EX LIQD: 1.0000 "application " | Freq: Once | CUTANEOUS | Status: DC

## 2012-11-21 MED ORDER — FENTANYL CITRATE 0.05 MG/ML IJ SOLN
INTRAMUSCULAR | Status: DC | PRN
  Administered 2012-11-21: 100 ug via INTRAVENOUS

## 2012-11-21 MED ORDER — OXYCODONE HCL 5 MG PO TABS
ORAL_TABLET | ORAL | Status: AC
  Filled 2012-11-21: qty 1

## 2012-11-21 MED ORDER — PROPOFOL 10 MG/ML IV BOLUS
INTRAVENOUS | Status: DC | PRN
  Administered 2012-11-21: 200 mg via INTRAVENOUS

## 2012-11-21 MED ORDER — BUPIVACAINE-EPINEPHRINE PF 0.5-1:200000 % IJ SOLN
INTRAMUSCULAR | Status: AC
  Filled 2012-11-21: qty 30

## 2012-11-21 MED ORDER — MIDAZOLAM HCL 2 MG/2ML IJ SOLN
INTRAMUSCULAR | Status: AC
  Filled 2012-11-21: qty 2

## 2012-11-21 MED ORDER — LACTATED RINGERS IV SOLN
INTRAVENOUS | Status: DC
  Administered 2012-11-21: 12:00:00 via INTRAVENOUS

## 2012-11-21 MED ORDER — CEFAZOLIN SODIUM 1-5 GM-% IV SOLN
INTRAVENOUS | Status: AC
  Filled 2012-11-21: qty 100

## 2012-11-21 MED ORDER — LIDOCAINE HCL (PF) 1 % IJ SOLN
INTRAMUSCULAR | Status: AC
  Filled 2012-11-21: qty 30

## 2012-11-21 MED ORDER — MIDAZOLAM HCL 2 MG/2ML IJ SOLN: 1.0000 mg | INTRAMUSCULAR | Status: DC | PRN

## 2012-11-21 MED ORDER — CEPHALEXIN 500 MG PO CAPS: 500.0000 mg | ORAL_CAPSULE | Freq: Four times a day (QID) | ORAL | Status: DC

## 2012-11-21 MED ORDER — OXYCODONE HCL 5 MG/5ML PO SOLN: 5.0000 mg | Freq: Once | ORAL | Status: AC | PRN

## 2012-11-21 MED ORDER — OXYCODONE HCL 5 MG PO TABS
5.0000 mg | ORAL_TABLET | Freq: Once | ORAL | Status: AC | PRN
  Administered 2012-11-21: 5 mg via ORAL

## 2012-11-21 MED ORDER — 0.9 % SODIUM CHLORIDE (POUR BTL) OPTIME
TOPICAL | Status: DC | PRN
  Administered 2012-11-21: 1000 mL

## 2012-11-21 MED ORDER — CEFAZOLIN SODIUM-DEXTROSE 2-3 GM-% IV SOLR
2.0000 g | INTRAVENOUS | Status: AC
  Administered 2012-11-21: 2 g via INTRAVENOUS

## 2012-11-21 MED ORDER — HYDROMORPHONE HCL PF 1 MG/ML IJ SOLN
INTRAMUSCULAR | Status: AC
  Filled 2012-11-21: qty 1

## 2012-11-21 MED ORDER — LIDOCAINE HCL (CARDIAC) 20 MG/ML IV SOLN
INTRAVENOUS | Status: DC | PRN
  Administered 2012-11-21: 50 mg via INTRAVENOUS

## 2012-11-21 MED ORDER — BACITRACIN ZINC 500 UNIT/GM EX OINT
TOPICAL_OINTMENT | CUTANEOUS | Status: AC
  Filled 2012-11-21: qty 1.8

## 2012-11-21 MED ORDER — BUPIVACAINE HCL (PF) 0.25 % IJ SOLN
INTRAMUSCULAR | Status: DC | PRN
  Administered 2012-11-21: 20 mL

## 2012-11-21 MED ORDER — FENTANYL CITRATE 0.05 MG/ML IJ SOLN: 50.0000 ug | INTRAMUSCULAR | Status: DC | PRN

## 2012-11-21 MED ORDER — ONDANSETRON HCL 4 MG/2ML IJ SOLN
INTRAMUSCULAR | Status: DC | PRN
  Administered 2012-11-21: 4 mg via INTRAVENOUS

## 2012-11-21 MED ORDER — OXYCODONE-ACETAMINOPHEN 5-325 MG PO TABS: 1.0000 | ORAL_TABLET | ORAL | Status: DC | PRN

## 2012-11-21 MED ORDER — FENTANYL CITRATE 0.05 MG/ML IJ SOLN
INTRAMUSCULAR | Status: AC
  Filled 2012-11-21: qty 4

## 2012-11-21 MED ORDER — HYDROMORPHONE HCL PF 1 MG/ML IJ SOLN
0.2500 mg | INTRAMUSCULAR | Status: DC | PRN
  Administered 2012-11-21 (×2): 0.25 mg via INTRAVENOUS
  Administered 2012-11-21 (×2): 0.5 mg via INTRAVENOUS
  Administered 2012-11-21: 0.25 mg via INTRAVENOUS

## 2012-11-21 MED ORDER — DEXAMETHASONE SODIUM PHOSPHATE 4 MG/ML IJ SOLN
INTRAMUSCULAR | Status: DC | PRN
  Administered 2012-11-21: 10 mg via INTRAVENOUS

## 2012-11-21 MED ORDER — SUCCINYLCHOLINE CHLORIDE 20 MG/ML IJ SOLN
INTRAMUSCULAR | Status: DC | PRN
  Administered 2012-11-21: 100 mg via INTRAVENOUS

## 2012-11-21 MED ORDER — MIDAZOLAM HCL 5 MG/5ML IJ SOLN
INTRAMUSCULAR | Status: DC | PRN
  Administered 2012-11-21: 2 mg via INTRAVENOUS

## 2012-11-21 SURGICAL SUPPLY — 51 items
ADH SKN CLS APL DERMABOND .7 (GAUZE/BANDAGES/DRESSINGS) ×1
APPLICATOR COTTON TIP 6IN STRL (MISCELLANEOUS) IMPLANT
BINDER BREAST LRG (GAUZE/BANDAGES/DRESSINGS) IMPLANT
BINDER BREAST MEDIUM (GAUZE/BANDAGES/DRESSINGS) IMPLANT
BINDER BREAST XLRG (GAUZE/BANDAGES/DRESSINGS) IMPLANT
BINDER BREAST XXLRG (GAUZE/BANDAGES/DRESSINGS) IMPLANT
BLADE HEX COATED 2.75 (ELECTRODE) ×2 IMPLANT
BLADE SURG 15 STRL LF DISP TIS (BLADE) ×1 IMPLANT
BLADE SURG 15 STRL SS (BLADE) ×2
CANISTER SUCT 1200ML W/VALVE (MISCELLANEOUS) ×2 IMPLANT
CHLORAPREP W/TINT 26ML (MISCELLANEOUS) ×2 IMPLANT
CLIP TI MEDIUM 6 (CLIP) IMPLANT
CLIP TI WIDE RED SMALL 6 (CLIP) IMPLANT
COVER MAYO STAND STRL (DRAPES) ×2 IMPLANT
COVER TABLE BACK 60X90 (DRAPES) ×2 IMPLANT
DECANTER SPIKE VIAL GLASS SM (MISCELLANEOUS) IMPLANT
DERMABOND ADVANCED (GAUZE/BANDAGES/DRESSINGS) ×1
DERMABOND ADVANCED .7 DNX12 (GAUZE/BANDAGES/DRESSINGS) ×1 IMPLANT
DEVICE DUBIN W/COMP PLATE 8390 (MISCELLANEOUS) IMPLANT
DRAPE LAPAROTOMY TRNSV 102X78 (DRAPE) ×2 IMPLANT
DRAPE SURG 17X23 STRL (DRAPES) IMPLANT
DRAPE UTILITY XL STRL (DRAPES) ×2 IMPLANT
ELECT REM PT RETURN 9FT ADLT (ELECTROSURGICAL) ×2
ELECTRODE REM PT RTRN 9FT ADLT (ELECTROSURGICAL) ×1 IMPLANT
GLOVE BIOGEL PI IND STRL 6.5 (GLOVE) IMPLANT
GLOVE BIOGEL PI IND STRL 7.0 (GLOVE) IMPLANT
GLOVE BIOGEL PI INDICATOR 6.5 (GLOVE) ×1
GLOVE BIOGEL PI INDICATOR 7.0 (GLOVE) ×1
GLOVE EUDERMIC 7 POWDERFREE (GLOVE) ×2 IMPLANT
GLOVE EXAM NITRILE MD LF STRL (GLOVE) ×1 IMPLANT
GOWN PREVENTION PLUS XLARGE (GOWN DISPOSABLE) ×4 IMPLANT
KIT MARKER MARGIN INK (KITS) IMPLANT
NDL HYPO 25X1 1.5 SAFETY (NEEDLE) ×1 IMPLANT
NEEDLE HYPO 25X1 1.5 SAFETY (NEEDLE) ×2 IMPLANT
NS IRRIG 1000ML POUR BTL (IV SOLUTION) ×1 IMPLANT
PACK BASIN DAY SURGERY FS (CUSTOM PROCEDURE TRAY) ×2 IMPLANT
PENCIL BUTTON HOLSTER BLD 10FT (ELECTRODE) ×2 IMPLANT
SHEET MEDIUM DRAPE 40X70 STRL (DRAPES) ×1 IMPLANT
SLEEVE SCD COMPRESS KNEE MED (MISCELLANEOUS) ×2 IMPLANT
SPONGE GAUZE 4X4 12PLY (GAUZE/BANDAGES/DRESSINGS) ×1 IMPLANT
SPONGE INTESTINAL PEANUT (DISPOSABLE) IMPLANT
SPONGE LAP 4X18 X RAY DECT (DISPOSABLE) ×2 IMPLANT
STAPLER VISISTAT 35W (STAPLE) IMPLANT
SUT MNCRL AB 4-0 PS2 18 (SUTURE) ×2 IMPLANT
SUT PROLENE 4 0 PS 2 18 (SUTURE) ×2 IMPLANT
SUT SILK 0 TIES 10X30 (SUTURE) IMPLANT
SUT VICRYL 3-0 CR8 SH (SUTURE) ×2 IMPLANT
SYR CONTROL 10ML LL (SYRINGE) ×2 IMPLANT
TOWEL OR NON WOVEN STRL DISP B (DISPOSABLE) ×2 IMPLANT
TUBE CONNECTING 20X1/4 (TUBING) ×2 IMPLANT
YANKAUER SUCT BULB TIP NO VENT (SUCTIONS) ×2 IMPLANT

## 2012-11-21 NOTE — Op Note (Signed)
LUCIEL BRICKMAN 11-06-1970 [id number] 11/21/2012   Preoperative diagnosis: inflamed epidermoid cyst medial aspect right breast (3.5 cm)  Postoperative diagnosis: same  Procedure: excision epidermoid cyst right breast medial aspect  Surgeon: Currie Paris, MD, FACS  Assistant:   Anesthesia: GA combined with regional for post-op pain   Clinical History and Indications: patient has developed a cystic mass in the very medial aspect of her right breast chest as the inframammary fold comes up onto the sternum. It's been intermittently tender. She has developed several other what appeared to be small cysts epidermoid cyst of the skin of both breasts over the last several weeks. The main one that we are excising today seems to gotten somewhat larger. We've never seen frank infection in it however.    Description of Procedure: I saw the patient preoperative area and confirmed the plans and marked as the right breast as the operative side. She was taken to the operating room and after satisfactory general anesthesia was obtained the middle of the sternum and both breasts were prepped and draped as a sterile field. A timeout was done.  I made an elliptical incision around the mass. It was somewhat of an ovoid-shaped mass so I made the incision along its long axis. I entered a cystic cavity at the superior end which was basically attached to the skin.however I was able to excise this and tacked in with what appears to be normal fatty tissue and all the margins. I did culture the fluid although is not frankly purulent.  I elevated the skin flaps to promote closure and take the tension off of the skin. I then closed the skin with interrupted vertical mattress 4-0 Prolene sutures. Sterile dressings were applied. The patient are the procedure well. No complications. Counts were correct.  Approximately 10 cc of 0.25% plain Marcaine was infiltrated into the wound prior to closure to help with postop  pain control. Currie Paris, MD, FACS 11/21/2012 1:51 PM

## 2012-11-21 NOTE — Anesthesia Preprocedure Evaluation (Addendum)
Anesthesia Evaluation  Patient identified by MRN, date of birth, ID band Patient awake    Reviewed: Allergy & Precautions, H&P , NPO status , Patient's Chart, lab work & pertinent test results  Airway Mallampati: II TM Distance: >3 FB Neck ROM: Full    Dental no notable dental hx. (+) Teeth Intact and Dental Advisory Given   Pulmonary neg pulmonary ROS,  breath sounds clear to auscultation  Pulmonary exam normal       Cardiovascular negative cardio ROS  Rhythm:Regular Rate:Normal     Neuro/Psych  Headaches, negative psych ROS   GI/Hepatic Neg liver ROS, GERD-  Medicated and Poorly Controlled,  Endo/Other  negative endocrine ROS  Renal/GU negative Renal ROS  negative genitourinary   Musculoskeletal   Abdominal   Peds  Hematology negative hematology ROS (+)   Anesthesia Other Findings   Reproductive/Obstetrics negative OB ROS                          Anesthesia Physical Anesthesia Plan  ASA: II  Anesthesia Plan: General   Post-op Pain Management:    Induction: Intravenous  Airway Management Planned: Oral ETT  Additional Equipment:   Intra-op Plan:   Post-operative Plan: Extubation in OR  Informed Consent: I have reviewed the patients History and Physical, chart, labs and discussed the procedure including the risks, benefits and alternatives for the proposed anesthesia with the patient or authorized representative who has indicated his/her understanding and acceptance.   Dental advisory given  Plan Discussed with: CRNA and Surgeon  Anesthesia Plan Comments:        Anesthesia Quick Evaluation

## 2012-11-21 NOTE — Transfer of Care (Signed)
Immediate Anesthesia Transfer of Care Note  Patient: Karen [name]  Procedure(s) Performed: Procedure(s): CYST EXCISION BREAST (Right)  Patient Location: PACU  Anesthesia Type:General  Level of Consciousness: awake and patient cooperative  Airway & Oxygen Therapy: Patient Spontanous Breathing and Patient connected to face mask oxygen  Post-op Assessment: Report given to PACU RN and Post -op Vital signs reviewed and stable  Post vital signs: Reviewed and stable  Complications: No apparent anesthesia complications

## 2012-11-21 NOTE — Anesthesia Procedure Notes (Signed)
Procedure Name: Intubation Performed by: Lance Coon Pre-anesthesia Checklist: Patient identified, Emergency Drugs available, Suction available and Patient being monitored Patient Re-evaluated:Patient Re-evaluated prior to inductionOxygen Delivery Method: Circle System Utilized Preoxygenation: Pre-oxygenation with 100% oxygen Intubation Type: IV induction and Cricoid Pressure applied Ventilation: Mask ventilation without difficulty Laryngoscope Size: Mac and 3 Grade View: Grade II Tube type: Oral Tube size: 7.0 mm Number of attempts: 1 Airway Equipment and Method: stylet and oral airway Placement Confirmation: ETT inserted through vocal cords under direct vision,  positive ETCO2 and breath sounds checked- equal and bilateral Tube secured with: Tape Dental Injury: Teeth and Oropharynx as per pre-operative assessment

## 2012-11-21 NOTE — Interval H&P Note (Signed)
History and Physical Interval Note:  11/21/2012 12:58 PM  Karen [name]  has presented today for surgery, with the diagnosis of breast cyst  The various methods of treatment have been discussed with the patient and family. After consideration of risks, benefits and other options for treatment, the patient has consented to  Procedure(s): CYST EXCISION BREAST (Right) as a surgical intervention .  The patient's history has been reviewed, patient examined, no change in status, stable for surgery.  I have reviewed the patient's chart and labs.  Questions were answered to the patient's satisfaction.     Choya Tornow J

## 2012-11-21 NOTE — H&P (View-Only) (Signed)
She comes in for followup. I saw her last week for what appeared to be a sebaceous cyst at the edge of the right breast lower inner quadrant. She is also having some breast symptoms so we requested an ultrasound and mammogram. She did develop some chest pain and was seen in the ED and a cardiac workup was negative although a followup with the cardiologist is now scheduled.  Since I saw her she has also developed some tiny cystic areas in a radial fashion in the right breast and some more in the left. These are 1-2 mm in size. She's not had any further discomfort in the site of the epidermoid cyst but continues to have some issues with chest pain. She's been told this might be costochondritis. She has a follow up ultrasound next week.  On exam she continues to have what appears to be a complex irregular sebaceous cyst in the breast. Unchanged from last week. However now there are 32 mm cystic dermal nodules in the right breast in about the 9:00 position and a radial fashion. There is a similar 1 and about the same area on the left.  Data reviewed: I did look at the ultrasound and mammogram reports as well as the notes and intake from her other visits.  Impression: Epidermoid cyst of chest 5 cm probably recently inflamed; question tiny additional epidermoid skin cyst developing of uncertain etiology and not clear Y. These are developing; chest pain with further cardiac evaluation with cardiologist pending  Plan: Since this is not acutely infected I think we can defer any surgical intervention although given its size and the fact she's had inflammation of the excision at some point would be optimal and I think it will require some IV sedation to do. She will contact me again next week after she has a followup ultrasound to see if there is any improvement in this area or any change.

## 2012-11-22 ENCOUNTER — Telehealth (INDEPENDENT_AMBULATORY_CARE_PROVIDER_SITE_OTHER): Payer: Self-pay | Admitting: General Surgery

## 2012-11-22 ENCOUNTER — Encounter (HOSPITAL_BASED_OUTPATIENT_CLINIC_OR_DEPARTMENT_OTHER): Payer: Self-pay | Admitting: Surgery

## 2012-11-22 ENCOUNTER — Telehealth (INDEPENDENT_AMBULATORY_CARE_PROVIDER_SITE_OTHER): Payer: Self-pay | Admitting: *Deleted

## 2012-11-22 ENCOUNTER — Encounter: Payer: 59 | Attending: Internal Medicine | Admitting: *Deleted

## 2012-11-22 ENCOUNTER — Encounter (INDEPENDENT_AMBULATORY_CARE_PROVIDER_SITE_OTHER): Payer: Self-pay | Admitting: *Deleted

## 2012-11-22 DIAGNOSIS — Z713 Dietary counseling and surveillance: Secondary | ICD-10-CM | POA: Insufficient documentation

## 2012-11-22 DIAGNOSIS — E663 Overweight: Secondary | ICD-10-CM | POA: Insufficient documentation

## 2012-11-22 DIAGNOSIS — E669 Obesity, unspecified: Secondary | ICD-10-CM | POA: Insufficient documentation

## 2012-11-22 NOTE — Telephone Encounter (Signed)
Letter completed and faxed to patient at this time.

## 2012-11-22 NOTE — Telephone Encounter (Signed)
Patient gave fax number of [phone] as her personal fax # to send letter too if it is approved.

## 2012-11-22 NOTE — Telephone Encounter (Signed)
Patient called to ask for a note for work so she can work from home.  Patient states she is also going to drop off FMLA paperwork just in case.  Patient states she doesn't think she will need it but wants to have it just to be safe.  Explained that I would send a message to Dr. Jamey Ripa to ask what he feels would be the appropriate plan for the patient and work then we will let her know.  Patient states understanding at this time.

## 2012-11-22 NOTE — Anesthesia Postprocedure Evaluation (Signed)
  Anesthesia Post-op Note  Patient: Karen [name]  Procedure(s) Performed: Procedure(s): CYST EXCISION BREAST (Right)  Patient Location: PACU  Anesthesia Type:General  Level of Consciousness: awake and alert   Airway and Oxygen Therapy: Patient Spontanous Breathing  Post-op Pain: mild  Post-op Assessment: Post-op Vital signs reviewed, Patient's Cardiovascular Status Stable and Respiratory Function Stable  Post-op Vital Signs: Reviewed  Filed Vitals:   11/21/12 1642  BP: 118/78  Pulse: 73  Temp: 36.8 C  Resp: 16    Complications: No apparent anesthesia complications

## 2012-11-22 NOTE — Telephone Encounter (Signed)
Message copied by Liliana Cline on Thu Nov 22, 2012  9:32 AM ------      Message from: Currie Paris      Created: Wed Nov 21, 2012  1:59 PM       She will need to have a postop check next week to check the incision to be sure there is no infection developing. I closed the incision but put her on antibiotics.unless there is evidence of infection the sutures should stay in at least 2 weeks ------

## 2012-11-22 NOTE — Progress Notes (Signed)
Medical Nutrition Therapy:  Appt start time: 1700 end time:  1800.   Assessment:  Patient seen previously at [facility] by Pincus Large for weight loss. She had success losing weight with carb counting, portion control, and increased intake of vegetables, but got off track due to family health issues. She reports that she was eating for comfort and not paying attention to portions. She gained about 6 pounds over 1 year. She reports that she is ready to get back on track. Exercise is limited due to knee problems. No exercise goals set at this time, but she would like to look into water aerobics classes. She is a [employer], but works form home 2-3 days weekly.   MEDICATIONS: Reviewed   DIETARY INTAKE:   Usual eating pattern includes 3 meals and 1-2 snacks per day.  24-hr recall:  B ( AM): Raisin bran (1/2 cup) with skim milk, occasional [place] sausage/bacon OR scrambled egg with [place] sausage, water Snk ( AM): Sometimes, 1/2 cup [place] yogurt, pineapple, granola L ( PM): Takes lunch to work: Retail banker (veggies, grilled chicken OR taco with ground [place], tortilla strips, [employer] dressing-light) OR chicken salad sandwich, water Snk ( PM): None usually D ( PM): Spaghetti, salad, garlic toast OR Taco salad, green beans/kale/spinach, water Snk ( PM): Sometimes, chips, ice cream/cookies/cake Beverages: Water, milk, soda  Usual physical activity: None, some walking   Estimated energy needs: 1400 calories  158 g carbohydrates  105 g protein  39 g fat  Progress Towards Goal(s):  In progress.   Nutritional Diagnosis:  [place]-3.3 Overweight/obesity As related to excessive energy intake and physical inactivity.  As evidenced by BMI 33.6.    Intervention:  Nutrition counseling. We discussed strategies for weight loss, including balancing nutrients (carbs, protein, fat), portion control, healthy snacks, and exercise.   Goals:  1. 1 pound weight loss per week.  2. Use carb counting to control  calorie/portion intake. 2-3 carb choices per meal, 1-2 choices per snack.  3. Monitor portion size, balancing nutrients at meals. 4. Set rules for intake of sweets and sweetened drinks (ex. 1 portion 1-2 days weekly) 5. Consider exercising regularly: walking and/or water aerobics   Handouts given during visit include:  Yellow meal plan card  Monitoring/Evaluation:  Dietary intake, exercise, and body weight in 2 month(s).

## 2012-11-22 NOTE — Telephone Encounter (Signed)
If needed she can miss work till Monday - should be OK by then to return to work

## 2012-11-22 NOTE — Telephone Encounter (Signed)
Appt already made 11/28/2012 with Dr Andrey Campanile.

## 2012-11-24 LAB — WOUND CULTURE: Culture: NO GROWTH

## 2012-11-26 LAB — ANAEROBIC CULTURE

## 2012-11-28 ENCOUNTER — Encounter (INDEPENDENT_AMBULATORY_CARE_PROVIDER_SITE_OTHER): Payer: Self-pay | Admitting: General Surgery

## 2012-11-28 ENCOUNTER — Ambulatory Visit (INDEPENDENT_AMBULATORY_CARE_PROVIDER_SITE_OTHER): Payer: Commercial Managed Care - PPO | Admitting: General Surgery

## 2012-11-28 VITALS — BP 132/80 | HR 84 | Temp 97.6°F | Resp 14 | Ht 67.5 in | Wt 212.0 lb

## 2012-11-28 DIAGNOSIS — Z09 Encounter for follow-up examination after completed treatment for conditions other than malignant neoplasm: Secondary | ICD-10-CM

## 2012-11-28 NOTE — Progress Notes (Signed)
Subjective:     Patient ID: Karen [name], female   DOB: 1970-02-20, 42 y.o.   MRN: [id number]  HPI 42 year old African female comes in for followup after undergoing excision of a sternal epidermoid inclusion cyst involving a portion of her right breast on November 19. She denies any fevers or chills. She denies any drainage from the incision. She states there is just very sore.  Review of Systems     Objective:   Physical Exam BP 132/80  Pulse 84  Temp(Src) 97.6 F (36.4 C) (Temporal)  Resp 14  Ht 5' 7.5" (1.715 m)  Wt 212 lb (96.163 kg)  BMI 32.69 kg/m2 Alert, no apparent distress Patient examined with chaperone present. Chest-oblique incision overlying the sternum and right breast. No cellulitis. No induration. Sutures intact. No hematoma.    Assessment:     Status post excision of a large epidermoid inclusion cyst     Plan:     Overall the wound is healing well. It is too soon to remove her sutures. She'll be given appointment to see Dr. Jamey Ripa about a week and a half for suture removal. We discussed wound care. Call with any questions or concerns. She was given a copy of her path report  Karen [name]. Andrey Campanile, MD, FACS General, Bariatric, & Minimally Invasive Surgery [facility], [place]

## 2012-12-10 ENCOUNTER — Encounter (INDEPENDENT_AMBULATORY_CARE_PROVIDER_SITE_OTHER): Payer: Self-pay | Admitting: Surgery

## 2012-12-10 ENCOUNTER — Ambulatory Visit (INDEPENDENT_AMBULATORY_CARE_PROVIDER_SITE_OTHER): Payer: Commercial Managed Care - PPO | Admitting: Surgery

## 2012-12-10 VITALS — BP 130/74 | HR 72 | Temp 98.7°F | Resp 14 | Ht 67.5 in | Wt 213.8 lb

## 2012-12-10 DIAGNOSIS — N6081 Other benign mammary dysplasias of right breast: Secondary | ICD-10-CM

## 2012-12-10 DIAGNOSIS — N6089 Other benign mammary dysplasias of unspecified breast: Secondary | ICD-10-CM

## 2012-12-10 NOTE — Patient Instructions (Signed)
We will see you again on an as needed basis. Please call the office at [phone] if you have any questions or concerns. Thank you for allowing us to take care of you.  

## 2012-12-10 NOTE — Progress Notes (Signed)
NAME: Karen [name]                                            DOB: December 07, 1970 DATE: 12/10/2012                                                   MRN: [id number]  CC:  Chief Complaint  Patient presents with  . Routine Post Op    p/o rt br seb cyst    HPI: This patient comes in for post op follow-up .Sheunderwent excision of a complex sebaceous cyst from the right breast  on 11/21/12. She feels that she is doing well.  PE:  VITAL SIGNS: BP 130/74  Pulse 72  Temp(Src) 98.7 F (37.1 C) (Temporal)  Resp 14  Ht 5' 7.5" (1.715 m)  Wt 213 lb 12.8 oz (96.979 kg)  BMI 32.97 kg/m2  General: The patient appears to be healthy, NAD Incision healing nicely, sutures removed, Dermabond applied  DATA REVIEWED: Diagnosis Breast, biopsy, Right skin - SKIN WITH UNDERLYING EPIDERMAL INCLUSION CYST (FOLLICULAR CYST) FORMATION. - ASSOCIATED MARKED ACUTE AND CHRONIC INFLAMMATION WITH GIANT CELL REACTION CONSISTENT WITH RUPTURE. - NO DYSPLASIA, ATYPIA OR MALIGNANCY IDENTIFIED. Zandra Abts MD Pathologist, Electronic Signature (Case signed 11/26/2012)  IMPRESSION: The patient is doing well S/P Excision complex epidermoid cyst.    PLAN: We'll see again when necessary

## 2012-12-13 ENCOUNTER — Ambulatory Visit (INDEPENDENT_AMBULATORY_CARE_PROVIDER_SITE_OTHER): Payer: 59 | Admitting: Interventional Cardiology

## 2012-12-13 ENCOUNTER — Encounter: Payer: Self-pay | Admitting: Interventional Cardiology

## 2012-12-13 VITALS — BP 110/78 | HR 74 | Ht 67.0 in | Wt 213.0 lb

## 2012-12-13 DIAGNOSIS — R0989 Other specified symptoms and signs involving the circulatory and respiratory systems: Secondary | ICD-10-CM

## 2012-12-13 DIAGNOSIS — R079 Chest pain, unspecified: Secondary | ICD-10-CM | POA: Insufficient documentation

## 2012-12-13 DIAGNOSIS — R0600 Dyspnea, unspecified: Secondary | ICD-10-CM | POA: Insufficient documentation

## 2012-12-13 DIAGNOSIS — R0609 Other forms of dyspnea: Secondary | ICD-10-CM

## 2012-12-13 NOTE — Patient Instructions (Signed)
Your physician recommends that you continue on your current medications as directed. Please refer to the Current Medication list given to you today.  Your physician has requested that you have an exercise tolerance test. For further information please visit [email or web address]. Please also follow instruction sheet, as given.   

## 2012-12-13 NOTE — Progress Notes (Signed)
Patient ID: Karen [name], female   DOB: [birth year], 42 y.o.   MRN: [id number] Past Medical History  DVT left leg   Genella Rife with hiatal hernia   Endometriosis   Interstitial cystitis   Syncope evaluation in 2002   Migraine headache   Vertigo   Acid reflux      [address] [place], [place]  [address] Phone: [phone] Fax:  [phone]  Date:  12/13/2012   ID:  Karen [name], DOB [birth year], MRN [id number]  PCP:  Orland Penman, MD   ASSESSMENT:  1. Chest discomfort 2. Dyspnea, sporadic 3. Mild obesity  PLAN:  1.Exercise treadmill test   SUBJECTIVE: Karen [name] is a 42 y.o. female who complains of exertional and nonexertional chest discomfort. Precordial without radiation. The discomfort is relatively mild in intensity. In 2014 she has had 2 emergency room visits 4 separate episodes of chest discomfort. Myocardial infarction has been excluded on each occasion. EKG is a not demonstrate any evidence of ischemia. A prior echocardiogram 1 year ago did not demonstrate any structural abnormality. A CT angiogram done relatively recently did not reveal pulmonary emboli, dissection, or other pathology.  There is a family history of immature atherosclerosis (father had an MI at 72). She has siblings none of which have coronary disease. She does not smoke, is not diabetic, she doesn't have hypertension, and she denies hyperlipidemia there is a prior history of DVT, left lower extremity. 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   Wt Readings from Last 3 Encounters:  12/13/12 213 lb (96.616 kg)  12/10/12 213 lb 12.8 oz (96.979 kg)  11/28/12 212 lb (96.163 kg)     Past Medical History  Diagnosis Date  . Migraine   . Syncope, non cardiac   . DVT of lower extremity (deep venous thrombosis)   . Menorrhagia   . Dysmenorrhea   . Endometriosis   . Fibroids   . Adenomyosis     . 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 GERD (gastroesophageal reflux disease)   . Sebaceous cyst of breast, right lower inner quadrant 10/25/2012    Excised 11/21/12     Current Outpatient Prescriptions  Medication Sig Dispense Refill  . calcium-vitamin D 250-100 MG-UNIT per tablet Take 1 tablet by mouth daily.       . cetirizine (ZYRTEC) 10 MG tablet Take 1 tablet (10 mg total) by mouth daily.  30 tablet  2  . dexlansoprazole (DEXILANT) 60 MG capsule Take 60 mg by mouth daily.      Marland Kitchen ibuprofen (ADVIL,MOTRIN) 200 MG tablet Take 200 mg by mouth every 6 (six) hours as needed.      Marland Kitchen oxyCODONE-acetaminophen (ROXICET) 5-325 MG per tablet Take 1 tablet by mouth every 4 (four) hours as needed.  30 tablet  0  . Probiotic Product (PROBIOTIC PO) Take 1 capsule by mouth 4 (four) times daily as needed (for GI upset).      . sodium chloride (OCEAN) 0.65 % nasal spray Place 1 spray into the nose as needed for congestion.       No current facility-administered medications for this visit.    Allergies:   No Known Allergies  Social History:  The patient  reports that she has never smoked. She has never used smokeless tobacco. She reports that she drinks alcohol. She reports that she does not use illicit drugs.   ROS:  Please see the history of present illness.   She denies hemoptysis, wheezing, smoking, cough, phlegm production, and palpitations.   All other systems reviewed and negative.   OBJECTIVE: VS:  BP 110/78  Pulse 74  Ht 5\' 7"  (1.702 m)  Wt 213 lb (96.616 kg)  BMI 33.35 kg/m2 Well nourished, well developed, in no acute distress, mildly obese HEENT: normal Neck: JVD flat. Carotid bruit 2+  Cardiac:  normal S1, S2; RRR; no murmur Lungs:  clear to auscultation bilaterally, no wheezing, rhonchi or rales Abd: soft, nontender, no hepatomegaly Ext: Edema  Absent . Pulses 2+ and symmetric  Skin: warm and dry Neuro:  CNs 2-12 intact, no focal abnormalities noted  EKG:  Not  performed. Prior EKGs reviewed and were normal to       Signed, Darci Needle III, MD 12/13/2012 12:17 PM

## 2012-12-18 ENCOUNTER — Ambulatory Visit (HOSPITAL_COMMUNITY)
Admission: RE | Admit: 2012-12-18 | Discharge: 2012-12-18 | Disposition: A | Discharge status: Home / Self Care | Payer: 59 | Source: Ambulatory Visit | Attending: Interventional Cardiology | Admitting: Interventional Cardiology

## 2012-12-18 DIAGNOSIS — R079 Chest pain, unspecified: Secondary | ICD-10-CM | POA: Insufficient documentation

## 2012-12-20 ENCOUNTER — Telehealth: Payer: Self-pay

## 2012-12-20 NOTE — Telephone Encounter (Signed)
Message copied by Jarvis Newcomer on Thu Dec 20, 2012  4:15 PM ------      Message from: Verdis Prime      Created: Tue Dec 18, 2012  5:31 PM       The test was abnormal because of exercise-induced chest pain. Patient needs a return office visit to discuss results and determine if further testing need ------

## 2012-12-20 NOTE — Telephone Encounter (Signed)
pt given gxt results.The stress test is normal. No further cardiac workup needed. The EKG results were personally reviewed.pt verbalized understanding.

## 2013-01-17 ENCOUNTER — Encounter: Payer: Self-pay | Admitting: *Deleted

## 2013-01-17 ENCOUNTER — Encounter: Payer: 59 | Attending: Internal Medicine | Admitting: *Deleted

## 2013-01-17 VITALS — Ht 67.0 in | Wt 215.4 lb

## 2013-01-17 DIAGNOSIS — E669 Obesity, unspecified: Secondary | ICD-10-CM | POA: Insufficient documentation

## 2013-01-17 DIAGNOSIS — Z713 Dietary counseling and surveillance: Secondary | ICD-10-CM | POA: Insufficient documentation

## 2013-01-17 DIAGNOSIS — Z6836 Body mass index (BMI) 36.0-36.9, adult: Secondary | ICD-10-CM | POA: Insufficient documentation

## 2013-01-17 NOTE — Progress Notes (Signed)
Medical Nutrition Therapy:  Appt start time: 1600 end time:  1630.  Assessment:  Patient here today for a follow up visit for weight loss. Her weight is up about 1 pound from 2 months ago. However, given the recent holidays, it is good that she was able to maintain her weight. She reports that she has been eating well at meals, but snacking on desserts and soda remains an issue. She eats out of stress, and will reward herself with food. We discussed other ways to reward herself (activity that she likes, personal time, etc.). She also has not been exercising regularly, but is trying to find ways to incorporate more walking into her day.   MEDICATIONS: Reviewed   DIETARY INTAKE:   Usual eating pattern includes 3 meals and 1-2 snacks per day.  24-hr recall:  B ( AM): Raisin bran with skim milk OR scrambled egg with [place] sausage, water  Snk ( AM): Sometimes, [place] yogurt, fruit  L ( PM): Salad (grilled chicken, vegetables), water Snk ( PM): None, sometimes pie/cake D ( PM): Grilled chicken, salad, rice/potatoes Snk ( PM): Sometimes, ice cream/cookies/cake Beverages: Water, soda  Usual physical activity: Some walking  Estimated energy needs: 1400 calories  158 g carbohydrates  105 g protein  39 g fat  Progress Towards Goal(s):  Some progress.   Nutritional Diagnosis:   [place]-3.3 Overweight/obesity As related to excessive energy intake and physical inactivity. As evidenced by BMI 33.6.  Intervention:  Nutrition counseling. Patient praised on efforts to control intake at meals. We discussed strategies to limit sweets intake, including rewarding herself with something other than food, eating fruit, eating frozen raspberries, pineapple, or grapes, permitting intake of sweets on 1-2 days a week, or eating smaller portions of sweets. We also discussed becoming more aware of eating habits and trying to find other foods/activities before eating them.   Goals:  1. 1 pound weight loss per week.  2.  Continue to use carb counting to control calorie/portion intake. 2-3 carb choices per meal, 1-2 choices per snack.  3. Continue to monitor portion size, balancing nutrients at meals.  4. Use strategies above to control intake of sweets and sweetened drinks 5. Work up to exercising 3 days weekly: walking and/or water aerobics   Monitoring/Evaluation:  Dietary intake, exercise, and body weight in 2 month(s).

## 2013-03-18 ENCOUNTER — Other Ambulatory Visit (HOSPITAL_COMMUNITY): Payer: Self-pay | Admitting: Internal Medicine

## 2013-03-18 ENCOUNTER — Encounter: Payer: 59 | Attending: Internal Medicine | Admitting: *Deleted

## 2013-03-18 ENCOUNTER — Encounter: Payer: Self-pay | Admitting: *Deleted

## 2013-03-18 VITALS — Ht 67.0 in | Wt 217.1 lb

## 2013-03-18 DIAGNOSIS — Z713 Dietary counseling and surveillance: Secondary | ICD-10-CM | POA: Insufficient documentation

## 2013-03-18 DIAGNOSIS — E041 Nontoxic single thyroid nodule: Secondary | ICD-10-CM

## 2013-03-18 DIAGNOSIS — Z6836 Body mass index (BMI) 36.0-36.9, adult: Secondary | ICD-10-CM | POA: Insufficient documentation

## 2013-03-18 DIAGNOSIS — E669 Obesity, unspecified: Secondary | ICD-10-CM | POA: Insufficient documentation

## 2013-03-18 NOTE — Progress Notes (Signed)
Medical Nutrition Therapy:  Appt start time: 1000 end time:  1030.  Assessment:  Patient returns for a follow up visit for weight loss. Her weight is up 1.5 pounds from 2 months ago. She reports that her eating habits at meals have not changed. She has also been trying to watch portion size and limit unhealthy snacks. Instead of managing stress or rewarding herself with soda AND sweets, she just chooses one of these. However, these snacks are still increasing calories to a point where patient is not losing weight. She is still not exercising regularly, but is planning on looking into the [facility]. She is incorporating more walking into her day (parking further from work, using upstairs restroom at home).   MEDICATIONS: See list   DIETARY INTAKE:   Usual eating pattern includes 3 meals and 2 snacks per day.  24-hr recall:  B ( AM): 1/2 cup raisin bran with skim milk  Snk ( AM): Sometimes, [place] yogurt, fruit  L ( PM): Salad (grilled chicken, vegetables, 2 oz light dressing) Snk ( PM): None, sometimes sweets or soda D ( PM): Grilled chicken, salad, kale, rice/potatoes Snk ( PM): Sometimes, sweets Beverages: water, soda  Usual physical activity: Some walking  Estimated energy needs: 1400 calories 158 g carbohydrates 105 g protein 39 g fat  Progress Towards Goal(s):  Some progress.   Nutritional Diagnosis:  -3.3 Overweight/obesity As related to excessive energy intake and physical inactivity. As evidenced by BMI >30    Intervention:  Nutrition counseling. We discussed the importance of monitoring snack calories. Patient advised to limit snack calories to <300 kcal per day. If unhealthy sweets/soda desired, this limits the amount of other snacks available for the day. We also discussed being more mindful about snacking, and if she uses food as a reward, to do so 1 day of the week. Patient reminded that eating frozen fruit can be helpful when craving sweets. We also discussed increasing  physical activity.   Monitoring/Evaluation:  Dietary intake, exercise, and body weight in 2 month(s).

## 2013-03-20 ENCOUNTER — Ambulatory Visit (HOSPITAL_COMMUNITY)
Admission: RE | Admit: 2013-03-20 | Discharge: 2013-03-20 | Disposition: A | Discharge status: Home / Self Care | Payer: 59 | Source: Ambulatory Visit | Attending: Internal Medicine | Admitting: Internal Medicine

## 2013-03-20 ENCOUNTER — Ambulatory Visit (HOSPITAL_COMMUNITY): Payer: Self-pay

## 2013-03-20 DIAGNOSIS — E041 Nontoxic single thyroid nodule: Secondary | ICD-10-CM | POA: Insufficient documentation

## 2013-03-22 ENCOUNTER — Ambulatory Visit (HOSPITAL_COMMUNITY): Payer: 59

## 2013-05-20 ENCOUNTER — Encounter: Payer: Self-pay | Admitting: *Deleted

## 2013-05-20 ENCOUNTER — Encounter: Payer: 59 | Attending: Internal Medicine | Admitting: *Deleted

## 2013-05-20 VITALS — Ht 67.0 in | Wt 218.9 lb

## 2013-05-20 DIAGNOSIS — Z6836 Body mass index (BMI) 36.0-36.9, adult: Secondary | ICD-10-CM | POA: Insufficient documentation

## 2013-05-20 DIAGNOSIS — E669 Obesity, unspecified: Secondary | ICD-10-CM

## 2013-05-20 DIAGNOSIS — Z713 Dietary counseling and surveillance: Secondary | ICD-10-CM | POA: Insufficient documentation

## 2013-05-20 NOTE — Progress Notes (Signed)
Medical Nutrition Therapy:  Appt start time: 1100 end time:  1130.  Assessment:  Patient here today for a weight management follow up. Patient's weight continues to increase slowly (1.8 pounds in 2 months). Patient's meals and snacks early in the day continue to be healthy. However, she still struggles to control intake of sweets and sodas. She has not been exercising regularly. Patient has a very busy home and work schedule, which limits time. However, she admits to feeling overwhelmed with goals (has plans to exercise, but struggles to find time) and doesn't take enough time for herself. We discussed changing mindset, with the goal of stopping slow weight gain and learning to maintain weight. We also discussed setting small goals (walking for 5 minutes at a time several times a day, limiting soda/sweets to 1 per week) in order to get used to lifestyle changes.   MEDICATIONS: See list   DIETARY INTAKE:   Usual eating pattern includes 3 meals and 2 snacks per day.  24-hr recall:  B ( AM): 1/2 cup raisin bran with skim milk  Snk ( AM): Sometimes, [place] yogurt, fruit  L ( PM): Salad (grilled chicken, vegetables, 2 oz light dressing)  Snk ( PM): None, sometimes sweets or soda  D ( PM): Grilled chicken, salad, kale, rice/potatoes  Snk ( PM): Sometimes, sweets  Beverages: water, soda  Usual physical activity: Some walking  Estimated energy needs: 1400 calories 158 g carbohydrates 105 g protein 39 g fat  Progress Towards Goal(s):  Some progress.   Nutritional Diagnosis:  [place]-3.3 Overweight/obesity As related to excessive energy intake and physical inactivity. As evidenced by BMI >30    Intervention:  Nutrition counseling. We discussed the importance of setting small goals instead of getting overwhelmed by the bigger picture (i.e. Not having time to fit in 30 minutes of exercise). We also discussed using small bouts of exercise as a stress management strategy to prevent stress eating.    Goals:  1. Focus on making small changes to maintain weight.  2. Increase activity in the following ways:   A. Walk for 5-10 minutes during work breaks (around outside or inside of Hospital)  B. Set an alarm on phone or computer for every 2 hours. Get up and walk for 5 minutes each time.  3. When stressed, use exercise (5-10 minutes) as a coping strategy instead of food.  4. If eating sweets, limit portion size, avoiding all-or-nothing mentality.    Monitoring/Evaluation:  Dietary intake, exercise, and body weight in 2 month(s).

## 2013-07-01 ENCOUNTER — Other Ambulatory Visit (HOSPITAL_COMMUNITY): Payer: Self-pay | Admitting: Internal Medicine

## 2013-07-01 ENCOUNTER — Ambulatory Visit (HOSPITAL_COMMUNITY)
Admission: RE | Admit: 2013-07-01 | Discharge: 2013-07-01 | Disposition: A | Discharge status: Home / Self Care | Payer: 59 | Source: Ambulatory Visit | Attending: Internal Medicine | Admitting: Internal Medicine

## 2013-07-01 DIAGNOSIS — S8990XA Unspecified injury of unspecified lower leg, initial encounter: Secondary | ICD-10-CM

## 2013-07-01 DIAGNOSIS — S99919A Unspecified injury of unspecified ankle, initial encounter: Principal | ICD-10-CM

## 2013-07-01 DIAGNOSIS — S99929A Unspecified injury of unspecified foot, initial encounter: Secondary | ICD-10-CM

## 2013-07-01 DIAGNOSIS — M79609 Pain in unspecified limb: Secondary | ICD-10-CM | POA: Insufficient documentation

## 2013-07-22 ENCOUNTER — Encounter: Payer: 59 | Attending: Internal Medicine | Admitting: *Deleted

## 2013-07-22 ENCOUNTER — Encounter: Payer: Self-pay | Admitting: *Deleted

## 2013-07-22 VITALS — Ht 67.0 in | Wt 223.2 lb

## 2013-07-22 DIAGNOSIS — E669 Obesity, unspecified: Secondary | ICD-10-CM | POA: Insufficient documentation

## 2013-07-22 DIAGNOSIS — Z713 Dietary counseling and surveillance: Secondary | ICD-10-CM | POA: Insufficient documentation

## 2013-07-22 DIAGNOSIS — Z6836 Body mass index (BMI) 36.0-36.9, adult: Secondary | ICD-10-CM | POA: Insufficient documentation

## 2013-07-22 NOTE — Progress Notes (Signed)
Medical Nutrition Therapy:  Appt start time: 4585 end time:  9292.  Assessment:  Patient returns today for a weight management follow up. Patient's weight is up to 223.2 pounds from 218.9 pounds 2 months ago. Patient does report she has had some issues with fluid retention, so this may have contributed to weight gain. She has started walking with her husband and daughter for an hour 3 times daily, but sprained her toe and was unable to walk for several weeks. She is also looking into [place] fitness classes. She has also reduced snacking on sweets and soda during the day. We discussed focusing on the positive changes she has been able to make. She does report that she feels shaky around mid-afternoon, which may be a drop in blood glucose as a result of cutting out sweets at that time. We discussed monitoring symptoms (approximate time after lunch) and adding a healthy snack (fruit, yogurt, whole grain crackers) to prevent this.   MEDICATIONS: See list   DIETARY INTAKE:   Usual eating pattern includes 3 meals and 2 snacks per day.  24-hr recall:  B ( AM): 1/2 cup raisin bran with skim milk  Snk ( AM): Sometimes, [place] yogurt, fruit  L ( PM): Salad (grilled chicken, vegetables, 2 oz light dressing)  Snk ( PM): None D ( PM): Grilled chicken, salad, kale, rice/potatoes  Snk ( PM): Sometimes, fruit, occasional sweets  Beverages: water, soda now only occasionally   Usual physical activity: Walking 1 hour 3 days weekly  Estimated energy needs: 1400 calories 158 g carbohydrates 105 g protein 39 g fat  Progress Towards Goal(s):  Some progress.   Nutritional Diagnosis:  [place]-3.3 Overweight/obesity As related to excessive energy intake and physical inactivity. As evidenced by BMI >30    Intervention:  Nutrition counseling. Patient praised on her efforts to increase activity and reduce sugar intake. We discussed ways to prevent shakiness/hunger in the mid-afternoon, including incorporating a  high fiber, healthy carb with lunch and a healthy snack mid-afternoon. Continued to encourage regular exercise and small changes.   Handouts given during visit include:  Handwritten notes  Monitoring/Evaluation:  Dietary intake, exercise, and body weight in 1 month(s).

## 2013-08-26 ENCOUNTER — Encounter: Payer: 59 | Attending: Internal Medicine | Admitting: *Deleted

## 2013-08-26 ENCOUNTER — Encounter: Payer: Self-pay | Admitting: *Deleted

## 2013-08-26 DIAGNOSIS — Z713 Dietary counseling and surveillance: Secondary | ICD-10-CM | POA: Insufficient documentation

## 2013-08-26 DIAGNOSIS — E669 Obesity, unspecified: Secondary | ICD-10-CM

## 2013-08-26 DIAGNOSIS — Z6836 Body mass index (BMI) 36.0-36.9, adult: Secondary | ICD-10-CM | POA: Insufficient documentation

## 2013-08-26 NOTE — Progress Notes (Signed)
  Medical Nutrition Therapy:  Appt start time: 1130 end time:  1200.  Assessment:  Patient returns for a follow up visit for weight managment. Her weight is down to 221.7 from 223.2 pounds 2 months ago. She reports that she has been doing well following dietary plan. She has been portioning meals, watching meat portions, eating higher protein foods earlier in the day, and limiting sweet intake to 2 days a week. She has also cut back on soda intake, and has been drinking diet. Her sprained toe is healed, and she is walking for about an hour 2 days weekly. She has also met with a personal trainer at [facility], and plans to start taking exercise classes, working up to 3 days weekly. It seems that dietary and exercise changes are finally becoming habit, with patient able to identify stressors that previously led her to overeat before she indulges. Her daughter and husband are also making lifestyle changes, which has been a big support for her.   MEDICATIONS: See list   DIETARY INTAKE:   Usual eating pattern includes 3 meals and 2 snacks per day.  24-hr recall:  B ( AM): [place] yogurt with fruit  Snk ( AM): Sometimes, fruit  L ( PM): Salad with chicken, 2 oz light dressing Snk ( PM): None D ( PM): 4 oz chicken/Ground turkey/lean ground beef, salad/vegetables, rice/potatoes Snk ( PM): Sometimes, fruit Beverages: Water, diet soda, occasional regular soda  Usual physical activity: Walking 1 hour 2 days weekly  Estimated energy needs: 1400 calories 158 g carbohydrates 105 g protein 39 g fat  Progress Towards Goal(s):  Some progress.   Nutritional Diagnosis:  [place]-3.3 Overweight/obesity As related to excessive energy intake and physical inactivity. As evidenced by BMI >30     Intervention:  Nutrition counseling. Patient praised on her continued efforts to limit portions, limit sweet intake, and exercise more. We discussed continuing to be mindful of eating habits and cravings, and  utilized strategies for dealing with them. Finally, patient advised to start going to group exercise classes 1 day a week, and work up to 3 days a week.  Monitoring/Evaluation:  Dietary intake, exercise, and body weight in 2 month(s).

## 2013-10-14 ENCOUNTER — Ambulatory Visit (HOSPITAL_COMMUNITY)
Admission: RE | Admit: 2013-10-14 | Discharge: 2013-10-14 | Disposition: A | Discharge status: Home / Self Care | Payer: 59 | Source: Ambulatory Visit | Attending: Internal Medicine | Admitting: Internal Medicine

## 2013-10-14 ENCOUNTER — Other Ambulatory Visit (HOSPITAL_COMMUNITY): Payer: Self-pay | Admitting: Internal Medicine

## 2013-10-14 DIAGNOSIS — M7989 Other specified soft tissue disorders: Secondary | ICD-10-CM | POA: Diagnosis not present

## 2013-10-14 DIAGNOSIS — M79641 Pain in right hand: Secondary | ICD-10-CM | POA: Insufficient documentation

## 2013-10-14 DIAGNOSIS — M19041 Primary osteoarthritis, right hand: Secondary | ICD-10-CM

## 2013-10-24 ENCOUNTER — Ambulatory Visit (HOSPITAL_COMMUNITY)
Admission: RE | Admit: 2013-10-24 | Discharge: 2013-10-24 | Disposition: A | Discharge status: Home / Self Care | Payer: 59 | Source: Ambulatory Visit | Attending: Internal Medicine | Admitting: Internal Medicine

## 2013-10-24 ENCOUNTER — Other Ambulatory Visit (HOSPITAL_COMMUNITY): Payer: Self-pay | Admitting: Internal Medicine

## 2013-10-24 DIAGNOSIS — M79604 Pain in right leg: Secondary | ICD-10-CM | POA: Insufficient documentation

## 2013-10-24 DIAGNOSIS — M25561 Pain in right knee: Secondary | ICD-10-CM

## 2013-10-24 DIAGNOSIS — M79609 Pain in unspecified limb: Secondary | ICD-10-CM

## 2013-10-25 NOTE — Progress Notes (Signed)
*  Preliminary Results* Right lower extremity venous duplex completed. Right lower extremity is negative for deep vein thrombosis. There is no evidence of right Baker's cyst.  10/25/2013 12:10 PM  Maudry Mayhew, RVT, RDCS, RDMS

## 2013-10-29 ENCOUNTER — Encounter: Payer: 59 | Attending: Internal Medicine | Admitting: Dietician

## 2013-10-29 VITALS — Ht 67.0 in | Wt 220.8 lb

## 2013-10-29 DIAGNOSIS — E669 Obesity, unspecified: Secondary | ICD-10-CM | POA: Insufficient documentation

## 2013-10-29 DIAGNOSIS — Z6834 Body mass index (BMI) 34.0-34.9, adult: Secondary | ICD-10-CM | POA: Diagnosis not present

## 2013-10-29 DIAGNOSIS — Z713 Dietary counseling and surveillance: Secondary | ICD-10-CM | POA: Diagnosis not present

## 2013-10-29 NOTE — Patient Instructions (Addendum)
Think about trying to do a workout before work on the days you start work later. Management consultant, class, doing exercise at home) Continue cooking/preparing at home.  Try a [facility] Protein bar for a snack or breakfast. Try to eat 3 meals per day and up to 2 snack if needed. For snacks have protein with carbohydrates.  Try to have a Diet Dr. Malachi Bonds or no sugar tea instead of regular Pepsi.  Try Stevia or Splenda to sweeten tea.

## 2013-10-29 NOTE — Progress Notes (Signed)
  Medical Nutrition Therapy:  Appt start time: 1130 end time:  1215.  Assessment:  Patient returns for a follow up visit for weight managment. Previous saw Larey Seat, RD. Returns with a 3 lbs weight loss.   Has some swelling in her legs and hasn't been sleeping as well d/t leg pain. Trying to manage some health issues and stress. Has not made it to the [facility] Exercise classes. Feels like she is managing portion sizes better than before (measuring out meat). Counting out serving sizes of potato chips.   Will sometimes skip a meal which is causing her to eat a larger meal later. Works M-F, 8-5 and works from home 3 x week. 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   Wt Readings from Last 3 Encounters:  10/29/13 220 lb 12.8 oz (100.154 kg)  07/22/13 223 lb 3.2 oz (101.243 kg)  05/20/13 218 lb 14.4 oz (99.292 kg)   Ht Readings from Last 3 Encounters:  10/29/13 5\' 7"  (1.702 m)  07/22/13 5\' 7"  (1.702 m)  05/20/13 5\' 7"  (1.702 m)   Body mass index is 34.57 kg/(m^2). @BMIFA @ Normalized weight-for-age data available only for age 65 to 85 years. Normalized stature-for-age data available only for age 65 to 38 years.   MEDICATIONS: See list   DIETARY INTAKE:   Usual eating pattern includes 3 meals and 1-2 snacks per day.  24-hr recall:  B ( AM): [place] yogurt with fruit (skips or has it late more recently) Snk ( AM): sometimes though not lately L ( PM): Salad with chicken, 2 oz light dressing Snk ( PM): Pepsi D ( PM): 4 oz chicken/Ground turkey/lean ground beef, salad/vegetables, rice/potatoes Snk ( PM): Sometimes, fruit or popcorn Beverages: Water most of the time, 1 regular soda  Usual physical activity: Walking 1 hour 2 days weekly  Estimated energy needs: 1400 calories 158 g carbohydrates 105 g protein 39 g fat  Progress Towards Goal(s):  Some progress.   Nutritional Diagnosis:  [place]-3.3 Overweight/obesity As related to excessive energy intake and physical inactivity. As  evidenced by BMI >30     Intervention:  Nutrition counseling.   Plan: Think about trying to do a workout before work on the days you start work later. Management consultant, class, doing exercise at home) Continue cooking/preparing at home.  Try a [facility] Protein bar for a snack or breakfast. Try to eat 3 meals per day and up to 2 snack if needed. For snacks have protein with carbohydrates.  Try to have a Diet Dr. Malachi Bonds or no sugar tea instead of regular Pepsi.  Try Stevia or Splenda to sweeten tea.  Monitoring/Evaluation:  Dietary intake, exercise, and body weight in 2 month(s).

## 2013-11-04 ENCOUNTER — Encounter: Payer: Self-pay | Admitting: *Deleted

## 2014-01-07 ENCOUNTER — Encounter: Payer: 59 | Attending: Internal Medicine | Admitting: Dietician

## 2014-01-07 VITALS — Ht 67.0 in | Wt 221.6 lb

## 2014-01-07 DIAGNOSIS — Z713 Dietary counseling and surveillance: Secondary | ICD-10-CM | POA: Insufficient documentation

## 2014-01-07 DIAGNOSIS — E669 Obesity, unspecified: Secondary | ICD-10-CM | POA: Diagnosis present

## 2014-01-07 DIAGNOSIS — Z6834 Body mass index (BMI) 34.0-34.9, adult: Secondary | ICD-10-CM | POA: Insufficient documentation

## 2014-01-07 NOTE — Patient Instructions (Addendum)
Plan to exercise 4 x week (Zumba and walking). Have one "real soda" and one diet soda per week. Drink mostly water, water with fruit, or tea without sweetener. Plan for "splurges" instead of just picking up sweets.  Pick one day to plan meals for the week.  Have one meal from a restaurant/take out per week.

## 2014-01-07 NOTE — Progress Notes (Signed)
  Medical Nutrition Therapy:  Appt start time: 1610 end time:  9604.  Assessment:  Patient returns for a follow up visit for weight managment. Returns with a 1 lb weight gain. Has been sick with bronchitis. Before that was working out (doing Zumba or walking).   Has not been eating much since she hasn't been feeling well. Trying to eat 3 fruits per day. Was just having soda 1-2 x week before she was sick.   Feels like she needs to work on cutting back on desserts and soda and emotional eating.   Wt Readings from Last 3 Encounters:  01/07/14 221 lb 9.6 oz (100.517 kg)  10/29/13 220 lb 12.8 oz (100.154 kg)  07/22/13 223 lb 3.2 oz (101.243 kg)   Ht Readings from Last 3 Encounters:  01/07/14 5\' 7"  (1.702 m)  10/29/13 5\' 7"  (1.702 m)  07/22/13 5\' 7"  (1.702 m)   Body mass index is 34.7 kg/(m^2). @BMIFA @ Normalized weight-for-age data available only for age 1 to 60 years. Normalized stature-for-age data available only for age 1 to 53 years.   MEDICATIONS: See list   DIETARY INTAKE:   Usual eating pattern includes 3 meals and 1-2 snacks per day.  24-hr recall:  B ( AM): [place] yogurt with blueberries, strawberries, or blackberries and granola or cereal  Snk ( AM): fruit and yogurt L ( PM): Salad with chicken, 2 oz light dressing or soup, increasing vegetables Snk ( PM): fruit D ( PM): 4 oz chicken/Ground turkey/lean ground beef or salmon, salad/vegetables, rice/potatoes Snk ( PM): Sometimes, fruit or popcorn or dessert  Beverages: Water most of the time, 4 cups of green tea, soda and ginger   Usual physical activity: Walking 20 minutes or doing Zumba 4 x week before getting sick  Estimated energy needs: 1400 calories 158 g carbohydrates 105 g protein 39 g fat  Progress Towards Goal(s):  Some progress.   Nutritional Diagnosis:  [place]-3.3 Overweight/obesity As related to excessive energy intake and physical inactivity. As evidenced by BMI >30     Intervention:  Nutrition  counseling.  Plan: Plan to exercise 4 x week (Zumba and walking). Have one "real soda" and one diet soda per week. Drink mostly water, water with fruit, or tea without sweetener. Plan for "splurges" instead of just picking up sweets.  Pick one day to plan meals for the week.  Have one meal from a restaurant/take out per week.   Monitoring/Evaluation:  Dietary intake, exercise, and body weight in 2 month(s).

## 2014-02-18 ENCOUNTER — Encounter: Payer: 59 | Attending: Internal Medicine | Admitting: Dietician

## 2014-02-18 VITALS — Ht 67.0 in | Wt 221.0 lb

## 2014-02-18 DIAGNOSIS — Z713 Dietary counseling and surveillance: Secondary | ICD-10-CM | POA: Diagnosis not present

## 2014-02-18 DIAGNOSIS — Z6834 Body mass index (BMI) 34.0-34.9, adult: Secondary | ICD-10-CM | POA: Insufficient documentation

## 2014-02-18 DIAGNOSIS — E669 Obesity, unspecified: Secondary | ICD-10-CM | POA: Insufficient documentation

## 2014-02-18 NOTE — Patient Instructions (Addendum)
Continue to exercise 4 x week (Zumba, yoga, and walking). Look for strength training exercises to add (personal trainer). Have one diet soda per week. Try decaf green tea.  Continue to plan for "splurges" instead of just picking up sweets.  Continue to pick one day to plan meals for the week.  Have one meal from a restaurant/take out per week.  Try to use a small plate for dinner to help with portions.  Think about using a food journal.  Think about a way to help relax in the hour before sleep (without electronics).  Try to keep to a set sleep schedule.

## 2014-02-18 NOTE — Progress Notes (Signed)
  Medical Nutrition Therapy:  Appt start time: 1100 end time:  1130.  Assessment:  Patient returns for a follow up visit for weight managment. Returns with no weight change. Overall working out 4 x week though did not work out last week.   Trying to eat 3 fruits per day. Having (regular or diet) soda 1-2 x week.  Feels like she has been doing well with cutting back on desserts. Feels like she is getting fuller quicker than before.  Feels like emotional eating is getting better now.   Thinks her clothes are fitting better even though she isn't losing weight. Might want to do a food journal. More aware of food choices.   Has been having trouble sleeping some lately (last week) and feeling tired.   Wt Readings from Last 3 Encounters:  02/18/14 221 lb (100.245 kg)  01/07/14 221 lb 9.6 oz (100.517 kg)  10/29/13 220 lb 12.8 oz (100.154 kg)   Ht Readings from Last 3 Encounters:  02/18/14 5\' 7"  (1.702 m)  01/07/14 5\' 7"  (1.702 m)  10/29/13 5\' 7"  (1.702 m)   Body mass index is 34.61 kg/(m^2). @BMIFA @ Normalized weight-for-age data available only for age 48 to 42 years. Normalized stature-for-age data available only for age 48 to 15 years.   MEDICATIONS: See list   DIETARY INTAKE:   Usual eating pattern includes 3 meals and 1-2 snacks per day.  24-hr recall:  B ( AM): [place] yogurt with blueberries, strawberries, or blackberries and granola or cereal  Snk ( AM): fruit and yogurt L ( PM): Salad with chicken, 2 oz light dressing or soup, increasing vegetables Snk ( PM): fruit D ( PM): 4 oz chicken/Ground turkey/lean ground beef or salmon, salad/vegetables, rice/potatoes Snk ( PM): Sometimes, fruit or popcorn or dessert  Beverages: Water most of the time, 4 cups of unsweet green tea, diet and regular soda (not everyday)   Usual physical activity: Walking at least 20 minutes, yoga, or doing Zumba 4 x week   Estimated energy needs: 1400 calories 158 g carbohydrates 105 g protein 39 g  fat  Progress Towards Goal(s):  Some progress.   Nutritional Diagnosis:  [place]-3.3 Overweight/obesity As related to excessive energy intake and physical inactivity. As evidenced by BMI >30     Intervention:  Nutrition counseling.  Plan: Continue to exercise 4 x week (Zumba, yoga, and walking). Look for strength training exercises to add (personal trainer). Have one diet soda per week. Try decaf green tea.  Continue to plan for "splurges" instead of just picking up sweets.  Continue to pick one day to plan meals for the week.  Have one meal from a restaurant/take out per week.  Try to use a small plate for dinner to help with portions.  Think about using a food journal.  Think about a way to help relax in the hour before sleep (without electronics).  Try to keep to a set sleep schedule.  Monitoring/Evaluation:  Dietary intake, exercise, and body weight in 2 month(s).

## 2014-03-07 ENCOUNTER — Other Ambulatory Visit (HOSPITAL_COMMUNITY): Payer: Self-pay | Admitting: Internal Medicine

## 2014-03-07 ENCOUNTER — Ambulatory Visit (HOSPITAL_COMMUNITY)
Admission: RE | Admit: 2014-03-07 | Discharge: 2014-03-07 | Disposition: A | Discharge status: Home / Self Care | Payer: 59 | Source: Ambulatory Visit | Attending: Internal Medicine | Admitting: Internal Medicine

## 2014-03-07 DIAGNOSIS — R059 Cough, unspecified: Secondary | ICD-10-CM

## 2014-03-07 DIAGNOSIS — R05 Cough: Secondary | ICD-10-CM | POA: Insufficient documentation

## 2014-04-21 ENCOUNTER — Ambulatory Visit: Payer: 59 | Admitting: Dietician

## 2014-05-14 ENCOUNTER — Encounter: Payer: Self-pay | Admitting: Dietician

## 2014-05-14 ENCOUNTER — Encounter: Payer: 59 | Attending: Internal Medicine | Admitting: Dietician

## 2014-05-14 VITALS — Ht 67.0 in | Wt 223.4 lb

## 2014-05-14 DIAGNOSIS — Z6834 Body mass index (BMI) 34.0-34.9, adult: Secondary | ICD-10-CM | POA: Diagnosis not present

## 2014-05-14 DIAGNOSIS — E669 Obesity, unspecified: Secondary | ICD-10-CM

## 2014-05-14 DIAGNOSIS — Z713 Dietary counseling and surveillance: Secondary | ICD-10-CM | POA: Diagnosis not present

## 2014-05-14 NOTE — Progress Notes (Signed)
  Medical Nutrition Therapy:  Appt start time: 300 end time:  330.  Assessment:  Patient returns for a follow up visit for weight managment. Returns with a 2 lbs weight gain. Has had bronchitis a couple times this year and added steroid nasal spray. Just got back from vacation in [place]. Feels like she's doing well. Was active on vacation. Has still been cutting back on dessert (doing soda or dessert).  Feels like emotional eating has gotten better. Recognizing it more than before. Has not done a food journal. Had tried doing some meal planning on weekends.   Wt Readings from Last 3 Encounters:  05/14/14 223 lb 6.4 oz (101.334 kg)  02/18/14 221 lb (100.245 kg)  01/07/14 221 lb 9.6 oz (100.517 kg)   Ht Readings from Last 3 Encounters:  05/14/14 5\' 7"  (1.702 m)  02/18/14 5\' 7"  (1.702 m)  01/07/14 5\' 7"  (1.702 m)   Body mass index is 34.98 kg/(m^2). @BMIFA @ Normalized weight-for-age data available only for age 28 to 40 years. Normalized stature-for-age data available only for age 28 to 51 years.   MEDICATIONS: See list   DIETARY INTAKE:   Usual eating pattern includes 3 meals and 1-2 snacks per day.  24-hr recall:  B ( AM): [place] yogurt with blueberries, strawberries, or blackberries and granola or cereal  Snk ( AM): fruit and yogurt L ( PM): Salad with chicken, 2 oz light dressing or soup, increasing vegetables Snk ( PM): fruit D ( PM): 4 oz chicken/Ground turkey/lean ground beef or salmon, salad/vegetables, rice/potatoes Snk ( PM): Sometimes, fruit or popcorn or dessert  Beverages: Water most of the time, 4 cups of unsweet green tea, diet and regular soda (not everyday)   Usual physical activity: walking while on vacation  Estimated energy needs: 1400 calories 158 g carbohydrates 105 g protein 39 g fat  Progress Towards Goal(s):  Some progress.   Nutritional Diagnosis:  [place]-3.3 Overweight/obesity As related to excessive energy intake and physical inactivity. As evidenced by  BMI >30     Intervention:  Nutrition counseling.  Plan: Stop buying sodas.  Think about using a food journal.  Get back to exercise 4 x week (Zumba, yoga, and walking). Look for strength training exercises to add (personal trainer). Continue to plan for "splurges" instead of just picking up sweets.  Have one meal from a restaurant/take out per week.  Try to use a small plate for dinner to help with portions.  Pay attention to hunger/fullness cues. Take your time when eating (20 minutes). Eat without distraction when possible.   Monitoring/Evaluation:  Dietary intake, exercise, and body weight in 3 month(s).

## 2014-05-14 NOTE — Patient Instructions (Addendum)
Stop buying sodas.  Think about using a food journal.  Get back to exercise 4 x week (Zumba, yoga, and walking). Look for strength training exercises to add (personal trainer). Continue to plan for "splurges" instead of just picking up sweets.  Have one meal from a restaurant/take out per week.  Try to use a small plate for dinner to help with portions.  Pay attention to hunger/fullness cues. Take your time when eating (20 minutes). Eat without distraction when possible.

## 2014-08-11 ENCOUNTER — Encounter: Payer: 59 | Attending: Internal Medicine | Admitting: Dietician

## 2014-08-11 VITALS — Ht 67.0 in | Wt 215.0 lb

## 2014-08-11 DIAGNOSIS — Z6834 Body mass index (BMI) 34.0-34.9, adult: Secondary | ICD-10-CM | POA: Insufficient documentation

## 2014-08-11 DIAGNOSIS — Z713 Dietary counseling and surveillance: Secondary | ICD-10-CM | POA: Insufficient documentation

## 2014-08-11 DIAGNOSIS — E669 Obesity, unspecified: Secondary | ICD-10-CM | POA: Insufficient documentation

## 2014-08-11 NOTE — Progress Notes (Signed)
  Medical Nutrition Therapy:  Appt start time: 500 end time: 525    Assessment:  Patient returns for a follow up visit for weight managment. Returns with a 8 lbs weight loss. Started taking Pepcid instead of Dexilant. Has had bad GERD symptoms and trying to figure out what foods she can eat that won't trigger it. Has been vomiting. Hasn't been able to have a lot of highly seasoned foods, salads, or things with tomatoes or fruits.   Wt Readings from Last 3 Encounters:  08/11/14 215 lb (97.523 kg)  05/14/14 223 lb 6.4 oz (101.334 kg)  02/18/14 221 lb (100.245 kg)   Ht Readings from Last 3 Encounters:  08/11/14 5\' 7"  (1.702 m)  05/14/14 5\' 7"  (1.702 m)  02/18/14 5\' 7"  (1.702 m)   Body mass index is 33.67 kg/(m^2). @BMIFA @ Normalized weight-for-age data available only for age 49 to 70 years. Normalized stature-for-age data available only for age 49 to 49 years.    MEDICATIONS: See list   DIETARY INTAKE:   Usual eating pattern includes 3 meals and 1-2 snacks per day.  24-hr recall:  B ( AM): [place]ais[place] bran cereal with 2% or instant oatmeal flavored  Snk ( AM): none L ( PM): tuna fish sandwich with potato chips  Snk ( PM): none D ( PM): hot dog with white/wheat bread and chips or clam chowder light or chicken noodle soup Snk ( PM): popcorn or saltines or peppermint candy Beverages: Water most of the time, 4 cups of unsweet green tea, diet and regular soda (not everyday)   Usual physical activity: yoga 4 x week (not past 3 weeks), walking some not consistently   Estimated energy needs: 1400 calories 158 g carbohydrates 105 g protein 39 g fat  Progress Towards Goal(s):  Some progress.   Nutritional Diagnosis:  [place]-3.3 Overweight/obesity As related to excessive energy intake and physical inactivity. As evidenced by BMI >30     Intervention:  Nutrition counseling.  Plan: Continue to cut back on sodas.  Get back to exercise 4 x week (Zumba, yoga, and walking). Look for strength  training exercises to add (personal trainer). Have one meal from a restaurant/take out per week.  Continue having small portions.  Try not drinking 15 minutes before a meal and wait 30 minutes to drink after a meal.  Eat slowly and chew well.  Try sugar free popscles for some cool on your throat.   Monitoring/Evaluation:  Dietary intake, exercise, and body weight in 3 month(s).

## 2014-08-11 NOTE — Patient Instructions (Addendum)
Continue to cut back on sodas.  Get back to exercise 4 x week (Zumba, yoga, and walking). Look for strength training exercises to add (personal trainer). Have one meal from a restaurant/take out per week.  Continue having small portions.  Try not drinking 15 minutes before a meal and wait 30 minutes to drink after a meal.  Eat slowly and chew well.  Try sugar free popscles for some cool on your throat.

## 2014-08-21 ENCOUNTER — Other Ambulatory Visit: Payer: Self-pay

## 2014-08-21 DIAGNOSIS — Z1231 Encounter for screening mammogram for malignant neoplasm of breast: Secondary | ICD-10-CM

## 2014-08-27 ENCOUNTER — Ambulatory Visit
Admission: RE | Admit: 2014-08-27 | Discharge: 2014-08-27 | Disposition: A | Discharge status: Home / Self Care | Payer: 59 | Source: Ambulatory Visit

## 2014-08-27 DIAGNOSIS — Z1231 Encounter for screening mammogram for malignant neoplasm of breast: Secondary | ICD-10-CM

## 2014-08-28 ENCOUNTER — Emergency Department (HOSPITAL_COMMUNITY)
Admission: EM | Admit: 2014-08-28 | Discharge: 2014-08-28 | Disposition: A | Discharge status: Home / Self Care | Payer: 59 | Attending: Emergency Medicine | Admitting: Emergency Medicine

## 2014-08-28 ENCOUNTER — Encounter (HOSPITAL_COMMUNITY): Payer: Self-pay | Admitting: Family Medicine

## 2014-08-28 DIAGNOSIS — R109 Unspecified abdominal pain: Secondary | ICD-10-CM | POA: Diagnosis present

## 2014-08-28 DIAGNOSIS — G43909 Migraine, unspecified, not intractable, without status migrainosus: Secondary | ICD-10-CM | POA: Insufficient documentation

## 2014-08-28 DIAGNOSIS — Z8679 Personal history of other diseases of the circulatory system: Secondary | ICD-10-CM | POA: Insufficient documentation

## 2014-08-28 DIAGNOSIS — K219 Gastro-esophageal reflux disease without esophagitis: Secondary | ICD-10-CM | POA: Insufficient documentation

## 2014-08-28 DIAGNOSIS — R1012 Left upper quadrant pain: Secondary | ICD-10-CM | POA: Insufficient documentation

## 2014-08-28 DIAGNOSIS — Z86718 Personal history of other venous thrombosis and embolism: Secondary | ICD-10-CM | POA: Insufficient documentation

## 2014-08-28 DIAGNOSIS — R110 Nausea: Secondary | ICD-10-CM | POA: Diagnosis not present

## 2014-08-28 DIAGNOSIS — Z86018 Personal history of other benign neoplasm: Secondary | ICD-10-CM | POA: Diagnosis not present

## 2014-08-28 DIAGNOSIS — M1990 Unspecified osteoarthritis, unspecified site: Secondary | ICD-10-CM | POA: Insufficient documentation

## 2014-08-28 DIAGNOSIS — R1013 Epigastric pain: Secondary | ICD-10-CM | POA: Insufficient documentation

## 2014-08-28 DIAGNOSIS — Z79899 Other long term (current) drug therapy: Secondary | ICD-10-CM | POA: Diagnosis not present

## 2014-08-28 LAB — COMPREHENSIVE METABOLIC PANEL
ALT: 21 U/L (ref 14–54)
AST: 21 U/L (ref 15–41)
Albumin: 3.8 g/dL (ref 3.5–5.0)
Alkaline Phosphatase: 71 U/L (ref 38–126)
Anion gap: 6 (ref 5–15)
BUN: <5 mg/dL — ABNORMAL LOW (ref 6–20)
CO2: 26 mmol/L (ref 22–32)
Calcium: 9.3 mg/dL (ref 8.9–10.3)
Chloride: 107 mmol/L (ref 101–111)
Creatinine, Ser: 0.75 mg/dL (ref 0.44–1.00)
GFR calc Af Amer: >60 mL/min (ref >60)
GFR calc non Af Amer: >60 mL/min (ref >60)
Glucose, Bld: 95 mg/dL (ref 65–99)
Potassium: 3.7 mmol/L (ref 3.5–5.1)
Sodium: 139 mmol/L (ref 135–145)
Total Bilirubin: 0.6 mg/dL (ref 0.3–1.2)
Total Protein: 7.4 g/dL (ref 6.5–8.1)

## 2014-08-28 LAB — CBC
HCT: 37.7 % (ref 36.0–46.0)
Hemoglobin: 12.4 g/dL (ref 12.0–15.0)
MCH: 32.3 pg (ref 26.0–34.0)
MCHC: 32.9 g/dL (ref 30.0–36.0)
MCV: 98.2 fL (ref 78.0–100.0)
Platelets: 202 10*3/uL (ref 150–400)
RBC: 3.84 MIL/uL — ABNORMAL LOW (ref 3.87–5.11)
RDW: 12.8 % (ref 11.5–15.5)
WBC: 4.6 10*3/uL (ref 4.0–10.5)

## 2014-08-28 LAB — LIPASE, BLOOD: Lipase: 33 U/L (ref 22–51)

## 2014-08-28 MED ORDER — ONDANSETRON 4 MG PO TBDP
ORAL_TABLET | ORAL | Status: AC
  Filled 2014-08-28: qty 2

## 2014-08-28 MED ORDER — DICYCLOMINE HCL 10 MG PO CAPS
10.0000 mg | ORAL_CAPSULE | Freq: Once | ORAL | Status: AC
  Administered 2014-08-28: 10 mg via ORAL
  Filled 2014-08-28: qty 1

## 2014-08-28 MED ORDER — SODIUM CHLORIDE 0.9 % IV BOLUS (SEPSIS)
1000.0000 mL | Freq: Once | INTRAVENOUS | Status: AC
  Administered 2014-08-28: 1000 mL via INTRAVENOUS

## 2014-08-28 MED ORDER — ONDANSETRON 4 MG PO TBDP
4.0000 mg | ORAL_TABLET | Freq: Once | ORAL | Status: AC | PRN
  Administered 2014-08-28: 4 mg via ORAL

## 2014-08-28 MED ORDER — PROMETHAZINE HCL 25 MG/ML IJ SOLN
12.5000 mg | Freq: Once | INTRAMUSCULAR | Status: AC
  Administered 2014-08-28: 12.5 mg via INTRAVENOUS
  Filled 2014-08-28: qty 1

## 2014-08-28 NOTE — ED Notes (Signed)
Pt here for LUQ pain with vomiting all day. sts hx of GERD and has had her medications changed recently.

## 2014-08-28 NOTE — ED Provider Notes (Signed)
CSN: [id number]     Arrival date & time 08/28/14  1552 History   First MD Initiated Contact with Patient 08/28/14 1727     Chief Complaint  Patient presents with  . Abdominal Pain     (Consider location/radiation/quality/duration/timing/severity/associated sxs/prior Treatment) HPI   44 year old female with upper abdominal pain and nausea/vomiting. Onset this weekend and persistent. Pain is in epigastrium and left upper quadrant. Deep, achy pain. Associated with nausea and vomited several times today. Patient has a past history of gastroesophageal reflux and hiatal hernia. She is followed by gastroenterology for this. She was previously on dexilant this was changed to prilosec and H2 blocker because of the side effects. This did not help her symptoms so she restarted on the dexilant yesterday. Alternating constipation and diarrhea for the past month. No fevers or chills. Surgical history significant for cholecystectomy and hysterectomy. No fever or chills. No sick contacts. Had dose of zofran in ED prior to my evaluation but still feels nauseated.   Past Medical History  Diagnosis Date  . Migraine   . Syncope, non cardiac   . DVT of lower extremity (deep venous thrombosis)   . Menorrhagia   . Dysmenorrhea   . Endometriosis   . Fibroids   . Adenomyosis   . 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 GERD (gastroesophageal reflux disease)   . Sebaceous cyst of breast, right lower inner quadrant 10/25/2012    Excised 11/21/12    Past Surgical History  Procedure Laterality Date  . Vaginal hysterectomy    . Arthroscopic haglunds repair    . Cholecystectomy    . Laproscopic abdominal    . Esophagogastroduodenoscopy  05/02/2011    Procedure: ESOPHAGOGASTRODUODENOSCOPY (EGD);  Surgeon: Winfield Cunas., MD;  Location: [facility];  Service: Endoscopy;  Laterality: N/A;  . Colonoscopy  05/02/2011    Procedure: COLONOSCOPY;  Surgeon: Winfield Cunas., MD;  Location: [facility];  Service: Endoscopy;  Laterality: N/A;  . Cesarean section    . Laparoscopy    . Knee surgery    . Breast cyst excision Right 11/21/2012    Procedure: CYST EXCISION BREAST;  Surgeon: Haywood Lasso, MD;  Location: [facility];  Service: General;  Laterality: Right;   Family History  Problem Relation Age of Onset  . Colon cancer Father   . Hypertension Father   . Stroke Father   . Heart disease Father   . Heart attack Father   . Colon cancer Mother   . Diabetes Maternal Uncle   . Stroke Paternal Grandmother   . Heart attack Paternal Grandmother   . Heart attack Maternal Grandmother   . Heart attack Maternal Grandfather   . Cancer Maternal Uncle    Social History  Substance Use Topics  . Smoking status: Never Smoker   . Smokeless tobacco: Never Used  . Alcohol Use: Yes     Comment: occasionally   OB History    Gravida Para Term Preterm AB TAB SAB Ectopic Multiple Living   2 2        2      Review of Systems  All systems reviewed and negative, other than as noted in HPI.   Allergies  Review of patient's allergies indicates no known allergies.  Home Medications   Prior to Admission medications   Medication Sig Start Date End Date Taking? Authorizing Provider  Azelastine-Fluticasone 137-50 MCG/ACT SUSP Place 1 spray into the nose daily.  Yes Historical Provider, MD  calcium-vitamin D 250-100 MG-UNIT per tablet Take 1 tablet by mouth daily.    Yes Historical Provider, MD  dexlansoprazole (DEXILANT) 60 MG capsule Take 60 mg by mouth daily.   Yes Historical Provider, MD  famotidine (PEPCID) 20 MG tablet Take 20 mg by mouth 2 (two) times daily.   Yes Historical Provider, MD  ibuprofen (ADVIL,MOTRIN) 200 MG tablet Take 200 mg by mouth every 6 (six) hours as needed for moderate pain.    Yes Historical Provider, MD  levocetirizine (XYZAL) 5 MG tablet Take 5 mg by mouth every evening.   Yes Historical Provider, MD  Multiple Vitamins-Minerals  (MULTIVITAMIN & MINERAL PO) Take 1 tablet by mouth daily.    Yes Historical Provider, MD  oxyCODONE-acetaminophen (ROXICET) 5-325 MG per tablet Take 1 tablet by mouth every 4 (four) hours as needed. Patient taking differently: Take 1 tablet by mouth every 4 (four) hours as needed for moderate pain.  11/21/12  Yes Neldon Mc, MD  Probiotic Product (PROBIOTIC PO) Take 1 capsule by mouth 4 (four) times daily as needed (for GI upset).   Yes Historical Provider, MD  cetirizine (ZYRTEC) 10 MG tablet Take 1 tablet (10 mg total) by mouth daily. Patient not taking: Reported on 05/14/2014 10/01/11   Awilda Metro, NP   BP 108/73 mmHg  Pulse 69  Temp(Src) 98.1 F (36.7 C)  Resp 18  SpO2 100% Physical Exam  Constitutional: She appears well-developed and well-nourished. No distress.  HENT:  Head: Normocephalic and atraumatic.  Eyes: Conjunctivae are normal. Right eye exhibits no discharge. Left eye exhibits no discharge.  Neck: Neck supple.  Cardiovascular: Normal rate, regular rhythm and normal heart sounds.  Exam reveals no gallop and no friction rub.   No murmur heard. Pulmonary/Chest: Effort normal and breath sounds normal. No respiratory distress.  Abdominal: Soft. She exhibits no distension. There is tenderness.  Very mild tenderness in epigastrium and left upper quadrant. No rebound or guarding. No distention.  Musculoskeletal: She exhibits no edema or tenderness.  Neurological: She is alert.  Skin: Skin is warm and dry.  Psychiatric: She has a normal mood and affect. Her behavior is normal. Thought content normal.  Nursing note and vitals reviewed.   ED Course  Procedures (including critical care time) Labs Review Labs Reviewed  COMPREHENSIVE METABOLIC PANEL - Abnormal; Notable for the following:    BUN <5 (*)    All other components within normal limits  CBC - Abnormal; Notable for the following:    RBC 3.84 (*)    All other components within normal limits  LIPASE, BLOOD     Imaging Review Mm Screening Breast Tomo Bilateral  08/28/2014   CLINICAL DATA:  Screening.  EXAM: DIGITAL SCREENING BILATERAL MAMMOGRAM WITH 3D TOMO WITH CAD  COMPARISON:  Previous exam(s).  ACR Breast Density Category b: There are scattered areas of fibroglandular density.  FINDINGS: There are no findings suspicious for malignancy. Images were processed with CAD.  IMPRESSION: No mammographic evidence of malignancy. A result letter of this screening mammogram will be mailed directly to the patient.  RECOMMENDATION: Screening mammogram in one year. (Code:SM-B-01Y)  BI-RADS CATEGORY  1: Negative.   Electronically Signed   By: Andres Shad   On: 08/28/2014 13:07   I have personally reviewed and evaluated these images and lab results as part of my medical decision-making.   EKG Interpretation None      MDM   Final diagnoses:  Epigastric pain  Nausea  44 year old female with epigastric/lower sternal pain. Symptoms seem most consistent with GERD. Abdominal exam is benign. Imaging deferred. Plan presumptive treatment. Has established GI care and can follow-up with them.     Virgel Manifold, MD 09/04/14 [phone]

## 2014-08-28 NOTE — Discharge Instructions (Signed)
Abdominal Pain, Women °Abdominal (stomach, pelvic, or belly) pain can be caused by many things. It is important to tell your doctor: °· The location of the pain. °· Does it come and go or is it present all the time? °· Are there things that start the pain (eating certain foods, exercise)? °· Are there other symptoms associated with the pain (fever, nausea, vomiting, diarrhea)? °All of this is helpful to know when trying to find the cause of the pain. °CAUSES  °· Stomach: virus or bacteria infection, or ulcer. °· Intestine: appendicitis (inflamed appendix), regional ileitis (Crohn's disease), ulcerative colitis (inflamed colon), irritable bowel syndrome, diverticulitis (inflamed diverticulum of the colon), or cancer of the stomach or intestine. °· Gallbladder disease or stones in the gallbladder. °· Kidney disease, kidney stones, or infection. °· Pancreas infection or cancer. °· Fibromyalgia (pain disorder). °· Diseases of the female organs: °¨ Uterus: fibroid (non-cancerous) tumors or infection. °¨ Fallopian tubes: infection or tubal pregnancy. °¨ Ovary: cysts or tumors. °¨ Pelvic adhesions (scar tissue). °¨ Endometriosis (uterus lining tissue growing in the pelvis and on the pelvic organs). °¨ Pelvic congestion syndrome (female organs filling up with blood just before the menstrual period). °¨ Pain with the menstrual period. °¨ Pain with ovulation (producing an egg). °¨ Pain with an IUD (intrauterine device, birth control) in the uterus. °¨ Cancer of the female organs. °· Functional pain (pain not caused by a disease, may improve without treatment). °· Psychological pain. °· Depression. °DIAGNOSIS  °Your doctor will decide the seriousness of your pain by doing an examination. °· Blood tests. °· X-rays. °· Ultrasound. °· CT scan (computed tomography, special type of X-ray). °· MRI (magnetic resonance imaging). °· Cultures, for infection. °· Barium enema (dye inserted in the large intestine, to better view it with  X-rays). °· Colonoscopy (looking in intestine with a lighted tube). °· Laparoscopy (minor surgery, looking in abdomen with a lighted tube). °· Major abdominal exploratory surgery (looking in abdomen with a large incision). °TREATMENT  °The treatment will depend on the cause of the pain.  °· Many cases can be observed and treated at home. °· Over-the-counter medicines recommended by your caregiver. °· Prescription medicine. °· Antibiotics, for infection. °· Birth control pills, for painful periods or for ovulation pain. °· Hormone treatment, for endometriosis. °· Nerve blocking injections. °· Physical therapy. °· Antidepressants. °· Counseling with a psychologist or psychiatrist. °· Minor or major surgery. °HOME CARE INSTRUCTIONS  °· Do not take laxatives, unless directed by your caregiver. °· Take over-the-counter pain medicine only if ordered by your caregiver. Do not take aspirin because it can cause an upset stomach or bleeding. °· Try a clear liquid diet (broth or water) as ordered by your caregiver. Slowly move to a bland diet, as tolerated, if the pain is related to the stomach or intestine. °· Have a thermometer and take your temperature several times a day, and record it. °· Bed rest and sleep, if it helps the pain. °· Avoid sexual intercourse, if it causes pain. °· Avoid stressful situations. °· Keep your follow-up appointments and tests, as your caregiver orders. °· If the pain does not go away with medicine or surgery, you may try: °¨ Acupuncture. °¨ Relaxation exercises (yoga, meditation). °¨ Group therapy. °¨ Counseling. °SEEK MEDICAL CARE IF:  °· You notice certain foods cause stomach pain. °· Your home care treatment is not helping your pain. °· You need stronger pain medicine. °· You want your IUD removed. °· You feel faint or   lightheaded. °· You develop nausea and vomiting. °· You develop a rash. °· You are having side effects or an allergy to your medicine. °SEEK IMMEDIATE MEDICAL CARE IF:  °· Your  pain does not go away or gets worse. °· You have a fever. °· Your pain is felt only in portions of the abdomen. The right side could possibly be appendicitis. The left lower portion of the abdomen could be colitis or diverticulitis. °· You are passing blood in your stools (bright red or black tarry stools, with or without vomiting). °· You have blood in your urine. °· You develop chills, with or without a fever. °· You pass out. °MAKE SURE YOU:  °· Understand these instructions. °· Will watch your condition. °· Will get help right away if you are not doing well or get worse. °Document Released: 10/17/2006 Document Revised: 05/06/2013 Document Reviewed: 11/06/2008 °ExitCare® Patient Information ©2015 [employer]. This information is not intended to replace advice given to you by your health care provider. Make sure you discuss any questions you have with your health care provider. ° °

## 2014-09-01 ENCOUNTER — Ambulatory Visit (HOSPITAL_COMMUNITY)
Admission: RE | Admit: 2014-09-01 | Discharge: 2014-09-01 | Disposition: A | Discharge status: Home / Self Care | Payer: 59 | Source: Ambulatory Visit | Attending: Gastroenterology | Admitting: Gastroenterology

## 2014-09-01 ENCOUNTER — Encounter (HOSPITAL_COMMUNITY)
Admission: RE | Disposition: A | Discharge status: Home / Self Care | Payer: Self-pay | Source: Ambulatory Visit | Attending: Gastroenterology

## 2014-09-01 ENCOUNTER — Encounter (HOSPITAL_COMMUNITY): Payer: Self-pay | Admitting: *Deleted

## 2014-09-01 DIAGNOSIS — M1990 Unspecified osteoarthritis, unspecified site: Secondary | ICD-10-CM | POA: Diagnosis not present

## 2014-09-01 DIAGNOSIS — K219 Gastro-esophageal reflux disease without esophagitis: Secondary | ICD-10-CM | POA: Insufficient documentation

## 2014-09-01 DIAGNOSIS — Z791 Long term (current) use of non-steroidal anti-inflammatories (NSAID): Secondary | ICD-10-CM | POA: Insufficient documentation

## 2014-09-01 DIAGNOSIS — Z79891 Long term (current) use of opiate analgesic: Secondary | ICD-10-CM | POA: Insufficient documentation

## 2014-09-01 DIAGNOSIS — K449 Diaphragmatic hernia without obstruction or gangrene: Secondary | ICD-10-CM | POA: Insufficient documentation

## 2014-09-01 DIAGNOSIS — Z86718 Personal history of other venous thrombosis and embolism: Secondary | ICD-10-CM | POA: Diagnosis not present

## 2014-09-01 DIAGNOSIS — R112 Nausea with vomiting, unspecified: Secondary | ICD-10-CM | POA: Diagnosis not present

## 2014-09-01 DIAGNOSIS — Z79899 Other long term (current) drug therapy: Secondary | ICD-10-CM | POA: Diagnosis not present

## 2014-09-01 DIAGNOSIS — K222 Esophageal obstruction: Secondary | ICD-10-CM | POA: Diagnosis not present

## 2014-09-01 HISTORY — PX: ESOPHAGOGASTRODUODENOSCOPY: SHX5428

## 2014-09-01 SURGERY — EGD (ESOPHAGOGASTRODUODENOSCOPY)
Anesthesia: Moderate Sedation

## 2014-09-01 MED ORDER — DIPHENHYDRAMINE HCL 50 MG/ML IJ SOLN
INTRAMUSCULAR | Status: AC
  Filled 2014-09-01: qty 1

## 2014-09-01 MED ORDER — SODIUM CHLORIDE 0.9 % IV SOLN: INTRAVENOUS | Status: DC

## 2014-09-01 MED ORDER — FENTANYL CITRATE (PF) 100 MCG/2ML IJ SOLN
INTRAMUSCULAR | Status: AC
  Filled 2014-09-01: qty 2

## 2014-09-01 MED ORDER — MIDAZOLAM HCL 5 MG/ML IJ SOLN
INTRAMUSCULAR | Status: AC
  Filled 2014-09-01: qty 2

## 2014-09-01 MED ORDER — MIDAZOLAM HCL 10 MG/2ML IJ SOLN
INTRAMUSCULAR | Status: DC | PRN
  Administered 2014-09-01: 2 mg via INTRAVENOUS
  Administered 2014-09-01 (×2): 1 mg via INTRAVENOUS
  Administered 2014-09-01: 2 mg via INTRAVENOUS
  Administered 2014-09-01: 1 mg via INTRAVENOUS

## 2014-09-01 MED ORDER — BUTAMBEN-TETRACAINE-BENZOCAINE 2-2-14 % EX AERO
INHALATION_SPRAY | CUTANEOUS | Status: DC | PRN
  Administered 2014-09-01: 2 via TOPICAL

## 2014-09-01 MED ORDER — FENTANYL CITRATE (PF) 100 MCG/2ML IJ SOLN
INTRAMUSCULAR | Status: DC | PRN
  Administered 2014-09-01 (×2): 25 ug via INTRAVENOUS
  Administered 2014-09-01 (×2): 12.5 ug via INTRAVENOUS

## 2014-09-01 NOTE — Discharge Instructions (Signed)
We will call tomorrow to schedule gastric emptying scan

## 2014-09-01 NOTE — Op Note (Signed)
[place], [id number]   ENDOSCOPY PROCEDURE REPORT  PATIENT: Karen [name], Karen [name]  MR#: [id number] BIRTHDATE: August 05, 1970 , 25  yrs. old GENDER: female ENDOSCOPIST:Tehila Sokolow Oletta Lamas, MD REFERRED BY: Dr. Kelton Pillar PROCEDURE DATE:  2014-09-26 PROCEDURE:   EGD ASA CLASS:    class II INDICATIONS: persistent nausea and vomiting of unclear cause MEDICATION: fentanyl 75 g, versed 7 mg  IV TOPICAL ANESTHETIC:   Cetacaine Spray  DESCRIPTION OF PROCEDURE:   After the risks and benefits of the procedure were explained, informed consent was obtained.  The Pentax Gastroscope [id number]  endoscope was introduced through the mouth  and advanced to the second portion of the duodenum .  The instrument was slowly withdrawn as the mucosa was fully examined. Estimated blood loss is zero unless otherwise noted in this procedure report. The duodenum was seen well. The 2nd duodenum and duodenal bulb were normal. The scope was withdrawn back into the stomach and the pyloric channel was widely patent in the antrum was normal. The scope was retro flaxen direct flex fuel was normal. There were no ulcerations, masses or any other gastric lesions. 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 The esophagus was otherwise normal. Scope was withdrawn in the patient tolerated the procedure well.    The scope was then withdrawn from the patient and the procedure completed.  COMPLICATIONS: There were no immediate complications.  ENDOSCOPIC IMPRESSION: 1. Nausea and Vomiting. No explanation on EGD 2. Small Schatski's ring and hiatal hernia. Probably insignificant RECOMMENDATIONS: we will keep the patient on current medications and arrange an elective outpatient gastric emptying scan and see back in the office in 6 to 8 weeks.   _______________________________ Lorrin MaisLaurence Spates, MD 09-26-2014 3:39 PM     cc:  CPT  CODES: ICD CODES:  The ICD and CPT codes recommended by this software are interpretations from the data that the clinical staff has captured with the software.  The verification of the translation of this report to the ICD and CPT codes and modifiers is the sole responsibility of the health care institution and practicing physician where this report was generated.  [employer]. will not be held responsible for the validity of the ICD and CPT codes included on this report.  AMA assumes no liability for data contained or not contained herein. CPT is a Designer, television/film set of the [employer].  PATIENT NAME:  Emmary, Culbreath MR#: [id number]

## 2014-09-01 NOTE — H&P (Signed)
Subjective:   Patient is a 44 y.o. female presents with persistent nausea and vomiting. This is despite the use of proton pump inhibitors. She went to the emergency room recently had normal liver testing lipase and WBC. She is post cholecystectomy. She's not really having much in the way abdominal pain. She did have EGD about 3 years ago that basically was normal. Due to her continued symptoms EGD is performed.. Procedure including risks and benefits discussed in office.  Patient Active Problem List   Diagnosis Date Noted  . Chest pain 12/13/2012  . Dyspnea 12/13/2012   Past Medical History  Diagnosis Date  . Migraine   . Syncope, non cardiac   . DVT of lower extremity (deep venous thrombosis)   . Menorrhagia   . Dysmenorrhea   . Endometriosis   . Fibroids   . Adenomyosis   . 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 GERD (gastroesophageal reflux disease)   . Sebaceous cyst of breast, right lower inner quadrant 10/25/2012    Excised 11/21/12     Past Surgical History  Procedure Laterality Date  . Vaginal hysterectomy    . Arthroscopic haglunds repair    . Cholecystectomy    . Laproscopic abdominal    . Esophagogastroduodenoscopy  05/02/2011    Procedure: ESOPHAGOGASTRODUODENOSCOPY (EGD);  Surgeon: Winfield Cunas., MD;  Location: [facility];  Service: Endoscopy;  Laterality: N/A;  . Colonoscopy  05/02/2011    Procedure: COLONOSCOPY;  Surgeon: Winfield Cunas., MD;  Location: [facility];  Service: Endoscopy;  Laterality: N/A;  . Cesarean section    . Laparoscopy    . Knee surgery    . Breast cyst excision Right 11/21/2012    Procedure: CYST EXCISION BREAST;  Surgeon: Haywood Lasso, MD;  Location: [place];  Service: General;  Laterality: Right;    Prescriptions prior to admission  Medication Sig Dispense Refill Last Dose  . calcium-vitamin D 250-100 MG-UNIT per tablet Take 1 tablet by mouth daily.    09/01/2014 at Unknown time  .  dexlansoprazole (DEXILANT) 60 MG capsule Take 60 mg by mouth daily.   09/01/2014 at 0800  . Multiple Vitamins-Minerals (MULTIVITAMIN & MINERAL PO) Take 1 tablet by mouth daily.    09/01/2014 at Unknown time  . Probiotic Product (PROBIOTIC PO) Take 1 capsule by mouth 4 (four) times daily as needed (for GI upset).   09/01/2014 at 0800  . Azelastine-Fluticasone 137-50 MCG/ACT SUSP Place 1 spray into the nose daily.    Past Week at Unknown time  . cetirizine (ZYRTEC) 10 MG tablet Take 1 tablet (10 mg total) by mouth daily. (Patient not taking: Reported on 05/14/2014) 30 tablet 2 Not Taking at Unknown time  . famotidine (PEPCID) 20 MG tablet Take 20 mg by mouth 2 (two) times daily.   Past Week at Unknown time  . ibuprofen (ADVIL,MOTRIN) 200 MG tablet Take 200 mg by mouth every 6 (six) hours as needed for moderate pain.    Past Week at Unknown time  . levocetirizine (XYZAL) 5 MG tablet Take 5 mg by mouth every evening.   Past Week at Unknown time  . oxyCODONE-acetaminophen (ROXICET) 5-325 MG per tablet Take 1 tablet by mouth every 4 (four) hours as needed. (Patient taking differently: Take 1 tablet by mouth every 4 (four) hours as needed for moderate pain. ) 30 tablet 0 unknown at unknown   No Known Allergies  Social History  Substance Use  Topics  . Smoking status: Never Smoker   . Smokeless tobacco: Never Used  . Alcohol Use: Yes     Comment: occasionally    Family History  Problem Relation Age of Onset  . Colon cancer Father   . Hypertension Father   . Stroke Father   . Heart disease Father   . Heart attack Father   . Colon cancer Mother   . Diabetes Maternal Uncle   . Stroke Paternal Grandmother   . Heart attack Paternal Grandmother   . Heart attack Maternal Grandmother   . Heart attack Maternal Grandfather   . Cancer Maternal Uncle      Objective:   Patient Vitals for the past 8 hrs:  BP Temp Temp src Pulse Resp SpO2 Height Weight  09/01/14 1454 139/81 mmHg 98.8 F (37.1 C) Oral 72  (!) 22 98 % 5\' 7"  (1.702 m) 93.895 kg (207 lb)         See MD Preop evaluation      Assessment:   1. Nausea and vomiting. Patient is postcholecystectomy and labs are all normal. She has failed to respond to PPI therapy  Plan:   EGD with further recommendations pending the results.

## 2014-09-02 ENCOUNTER — Other Ambulatory Visit (HOSPITAL_COMMUNITY): Payer: Self-pay | Admitting: Gastroenterology

## 2014-09-02 ENCOUNTER — Encounter (HOSPITAL_COMMUNITY): Payer: Self-pay | Admitting: Gastroenterology

## 2014-09-02 DIAGNOSIS — R112 Nausea with vomiting, unspecified: Secondary | ICD-10-CM

## 2014-09-11 ENCOUNTER — Ambulatory Visit (HOSPITAL_COMMUNITY)
Admission: RE | Admit: 2014-09-11 | Discharge: 2014-09-11 | Disposition: A | Discharge status: Home / Self Care | Payer: 59 | Source: Ambulatory Visit | Attending: Gastroenterology | Admitting: Gastroenterology

## 2014-09-11 DIAGNOSIS — R112 Nausea with vomiting, unspecified: Secondary | ICD-10-CM | POA: Insufficient documentation

## 2014-09-11 MED ORDER — TECHNETIUM TC 99M SULFUR COLLOID
2.0000 | Freq: Once | INTRAVENOUS | Status: DC | PRN
  Administered 2014-09-11: 2 via ORAL
  Filled 2014-09-11: qty 2

## 2014-10-02 ENCOUNTER — Other Ambulatory Visit (HOSPITAL_COMMUNITY): Payer: Self-pay | Admitting: Gastroenterology

## 2014-10-02 DIAGNOSIS — R109 Unspecified abdominal pain: Secondary | ICD-10-CM

## 2014-10-07 ENCOUNTER — Ambulatory Visit (HOSPITAL_COMMUNITY): Admission: RE | Admit: 2014-10-07 | Payer: 59 | Source: Ambulatory Visit | Admitting: Gastroenterology

## 2014-10-07 ENCOUNTER — Encounter (HOSPITAL_COMMUNITY): Admission: RE | Payer: Self-pay | Source: Ambulatory Visit

## 2014-10-07 SURGERY — EGD (ESOPHAGOGASTRODUODENOSCOPY)
Anesthesia: Moderate Sedation

## 2014-10-08 ENCOUNTER — Ambulatory Visit (HOSPITAL_COMMUNITY)
Admission: RE | Admit: 2014-10-08 | Discharge: 2014-10-08 | Disposition: A | Discharge status: Home / Self Care | Payer: 59 | Source: Ambulatory Visit | Attending: Gastroenterology | Admitting: Gastroenterology

## 2014-10-08 ENCOUNTER — Encounter (HOSPITAL_COMMUNITY): Payer: Self-pay

## 2014-10-08 DIAGNOSIS — N200 Calculus of kidney: Secondary | ICD-10-CM | POA: Diagnosis not present

## 2014-10-08 DIAGNOSIS — R109 Unspecified abdominal pain: Secondary | ICD-10-CM | POA: Diagnosis present

## 2014-10-08 MED ORDER — IOHEXOL 350 MG/ML SOLN
100.0000 mL | Freq: Once | INTRAVENOUS | Status: AC | PRN
  Administered 2014-10-08: 100 mL via INTRAVENOUS

## 2014-10-28 ENCOUNTER — Encounter: Payer: Self-pay | Admitting: Dietician

## 2014-10-28 ENCOUNTER — Encounter: Payer: 59 | Attending: Internal Medicine | Admitting: Dietician

## 2014-10-28 DIAGNOSIS — E669 Obesity, unspecified: Secondary | ICD-10-CM | POA: Insufficient documentation

## 2014-10-28 DIAGNOSIS — Z6834 Body mass index (BMI) 34.0-34.9, adult: Secondary | ICD-10-CM | POA: Diagnosis not present

## 2014-10-28 DIAGNOSIS — Z713 Dietary counseling and surveillance: Secondary | ICD-10-CM | POA: Insufficient documentation

## 2014-10-28 NOTE — Progress Notes (Signed)
  Medical Nutrition Therapy:  Appt start time: 530 end time:  615  Assessment:  Patient returns for a follow up visit for weight managment. Returns with a 1 lbs weight loss. Still having GERD and abdominal pain and less vomiting that she was having. Going from diarrhea to constipation. Taking Dexilant again. Not sure what is causing her problems. Starting doing a food/symptom journal. Has not noticed a pattern to her symptoms. Has been trying to cut out bread/gluten this past Sunday. Has not been tested for celiac disease. Doctor is trying to get her GI to calm down a bit before doing more invasive tests.   Still avoiding highly seasoned foods, salads/raw vegetables, or things with tomatoes or fruits. Has cut out sweets and sodas. Not having a lot of energy and not exercising lately.   Wt Readings from Last 3 Encounters:  10/28/14 206 lb 1.6 oz (93.486 kg)  09/01/14 207 lb (93.895 kg)  08/11/14 215 lb (97.523 kg)   Ht Readings from Last 3 Encounters:  09/01/14 5\' 7"  (1.702 m)  08/11/14 5\' 7"  (1.702 m)  05/14/14 5\' 7"  (1.702 m)   Body mass index is 32.27 kg/(m^2). @BMIFA @ Normalized weight-for-age data available only for age 71 to 78 years. Normalized stature-for-age data available only for age 71 to 3 years.   MEDICATIONS: See list   DIETARY INTAKE:   Usual eating pattern includes 3 meals and 1-2 snacks per day.  24-hr recall:  B ( AM): [place] bacon and 2 boiled eggs or fruit and bacon Snk ( AM): none L ( PM): chicken and green beans with potato salad Snk ( PM): none D ( PM): clam chowder soup Snk ( PM): popcorn Beverages: Water most of the time, 4 cups of unsweet green tea, sweet tea to help with constipation   Usual physical activity: none  Estimated energy needs: 1400 calories 158 g carbohydrates 105 g protein 39 g fat  Progress Towards Goal(s):  Some progress.   Nutritional Diagnosis:  [place]-3.3 Overweight/obesity As related to excessive energy intake and physical  inactivity. As evidenced by BMI >30     Intervention:  Nutrition counseling.  Recommended that [place] talk to her doctor about getting blood work to test for celiac disease. Explained that she would need to resume eating gluten in order to see if she reacts to it. After tests results she will schedule another follow up appointment with me.   Monitoring/Evaluation:  Dietary intake, exercise, and body weight prn.

## 2014-11-04 HISTORY — PX: OTHER SURGICAL HISTORY: SHX169

## 2014-11-05 ENCOUNTER — Ambulatory Visit: Payer: 59 | Admitting: Dietician

## 2014-11-14 ENCOUNTER — Other Ambulatory Visit (HOSPITAL_COMMUNITY): Payer: Self-pay | Admitting: Gastroenterology

## 2014-11-14 DIAGNOSIS — R748 Abnormal levels of other serum enzymes: Secondary | ICD-10-CM

## 2014-11-22 ENCOUNTER — Ambulatory Visit (HOSPITAL_BASED_OUTPATIENT_CLINIC_OR_DEPARTMENT_OTHER)
Admission: RE | Admit: 2014-11-22 | Discharge: 2014-11-22 | Disposition: A | Discharge status: Home / Self Care | Payer: 59 | Source: Ambulatory Visit | Attending: Gastroenterology | Admitting: Gastroenterology

## 2014-11-22 DIAGNOSIS — R109 Unspecified abdominal pain: Secondary | ICD-10-CM | POA: Diagnosis not present

## 2014-11-22 DIAGNOSIS — R748 Abnormal levels of other serum enzymes: Secondary | ICD-10-CM | POA: Insufficient documentation

## 2014-11-22 DIAGNOSIS — R140 Abdominal distension (gaseous): Secondary | ICD-10-CM | POA: Diagnosis not present

## 2014-11-22 DIAGNOSIS — Z9049 Acquired absence of other specified parts of digestive tract: Secondary | ICD-10-CM | POA: Diagnosis not present

## 2014-11-22 DIAGNOSIS — R112 Nausea with vomiting, unspecified: Secondary | ICD-10-CM | POA: Diagnosis not present

## 2014-11-22 MED ORDER — GADOBENATE DIMEGLUMINE 529 MG/ML IV SOLN
19.0000 mL | Freq: Once | INTRAVENOUS | Status: AC | PRN
  Administered 2014-11-22: 19 mL via INTRAVENOUS

## 2014-11-24 ENCOUNTER — Ambulatory Visit (HOSPITAL_COMMUNITY): Payer: 59

## 2014-11-24 ENCOUNTER — Other Ambulatory Visit: Payer: Self-pay | Admitting: Gastroenterology

## 2014-11-26 ENCOUNTER — Encounter (HOSPITAL_COMMUNITY): Payer: Self-pay | Admitting: Emergency Medicine

## 2014-11-26 ENCOUNTER — Ambulatory Visit (HOSPITAL_COMMUNITY): Admit: 2014-11-26 | Payer: 59 | Admitting: Gastroenterology

## 2014-11-26 ENCOUNTER — Emergency Department (HOSPITAL_COMMUNITY)
Admission: EM | Admit: 2014-11-26 | Discharge: 2014-11-26 | Disposition: A | Discharge status: Home / Self Care | Payer: 59 | Attending: Emergency Medicine | Admitting: Emergency Medicine

## 2014-11-26 ENCOUNTER — Emergency Department (HOSPITAL_COMMUNITY): Payer: 59

## 2014-11-26 ENCOUNTER — Encounter (HOSPITAL_COMMUNITY): Payer: Self-pay

## 2014-11-26 DIAGNOSIS — G8929 Other chronic pain: Secondary | ICD-10-CM | POA: Diagnosis not present

## 2014-11-26 DIAGNOSIS — R1031 Right lower quadrant pain: Secondary | ICD-10-CM | POA: Diagnosis present

## 2014-11-26 DIAGNOSIS — Z86018 Personal history of other benign neoplasm: Secondary | ICD-10-CM | POA: Insufficient documentation

## 2014-11-26 DIAGNOSIS — M1712 Unilateral primary osteoarthritis, left knee: Secondary | ICD-10-CM | POA: Insufficient documentation

## 2014-11-26 DIAGNOSIS — R1011 Right upper quadrant pain: Secondary | ICD-10-CM | POA: Insufficient documentation

## 2014-11-26 DIAGNOSIS — K5900 Constipation, unspecified: Secondary | ICD-10-CM | POA: Diagnosis not present

## 2014-11-26 DIAGNOSIS — K219 Gastro-esophageal reflux disease without esophagitis: Secondary | ICD-10-CM | POA: Insufficient documentation

## 2014-11-26 DIAGNOSIS — Z8742 Personal history of other diseases of the female genital tract: Secondary | ICD-10-CM | POA: Diagnosis not present

## 2014-11-26 DIAGNOSIS — Z86718 Personal history of other venous thrombosis and embolism: Secondary | ICD-10-CM | POA: Diagnosis not present

## 2014-11-26 DIAGNOSIS — Z7951 Long term (current) use of inhaled steroids: Secondary | ICD-10-CM | POA: Diagnosis not present

## 2014-11-26 DIAGNOSIS — Z87442 Personal history of urinary calculi: Secondary | ICD-10-CM | POA: Diagnosis not present

## 2014-11-26 DIAGNOSIS — R1084 Generalized abdominal pain: Secondary | ICD-10-CM | POA: Diagnosis not present

## 2014-11-26 DIAGNOSIS — Z79899 Other long term (current) drug therapy: Secondary | ICD-10-CM | POA: Diagnosis not present

## 2014-11-26 DIAGNOSIS — R109 Unspecified abdominal pain: Secondary | ICD-10-CM

## 2014-11-26 HISTORY — DX: Calculus of kidney: N20.0

## 2014-11-26 LAB — COMPREHENSIVE METABOLIC PANEL
ALBUMIN: 3.8 g/dL (ref 3.5–5.0)
ALK PHOS: 68 U/L (ref 38–126)
ALT: 21 U/L (ref 14–54)
ANION GAP: 9 (ref 5–15)
AST: 20 U/L (ref 15–41)
BUN: 9 mg/dL (ref 6–20)
CO2: 23 mmol/L (ref 22–32)
CREATININE: 0.75 mg/dL (ref 0.44–1.00)
Calcium: 9.7 mg/dL (ref 8.9–10.3)
Chloride: 105 mmol/L (ref 101–111)
GFR calc non Af Amer: >60 mL/min (ref >60)
Glucose, Bld: 106 mg/dL — ABNORMAL HIGH (ref 65–99)
POTASSIUM: 3.9 mmol/L (ref 3.5–5.1)
SODIUM: 137 mmol/L (ref 135–145)
TOTAL PROTEIN: 7.2 g/dL (ref 6.5–8.1)
Total Bilirubin: 1.1 mg/dL (ref 0.3–1.2)

## 2014-11-26 LAB — CBC WITH DIFFERENTIAL/PLATELET
BASOS PCT: 1 %
Basophils Absolute: 0 10*3/uL (ref 0.0–0.1)
EOS ABS: 0.1 10*3/uL (ref 0.0–0.7)
EOS PCT: 1 %
HCT: 35.8 % — ABNORMAL LOW (ref 36.0–46.0)
HEMOGLOBIN: 11.9 g/dL — AB (ref 12.0–15.0)
LYMPHS ABS: 1.4 10*3/uL (ref 0.7–4.0)
Lymphocytes Relative: 31 %
MCH: 32.6 pg (ref 26.0–34.0)
MCHC: 33.2 g/dL (ref 30.0–36.0)
MCV: 98.1 fL (ref 78.0–100.0)
MONOS PCT: 8 %
Monocytes Absolute: 0.4 10*3/uL (ref 0.1–1.0)
NEUTROS PCT: 59 %
Neutro Abs: 2.7 10*3/uL (ref 1.7–7.7)
PLATELETS: 185 10*3/uL (ref 150–400)
RBC: 3.65 MIL/uL — AB (ref 3.87–5.11)
RDW: 12.3 % (ref 11.5–15.5)
WBC: 4.4 10*3/uL (ref 4.0–10.5)

## 2014-11-26 LAB — URINALYSIS, ROUTINE W REFLEX MICROSCOPIC
BILIRUBIN URINE: NEGATIVE
GLUCOSE, UA: NEGATIVE mg/dL
HGB URINE DIPSTICK: NEGATIVE
KETONES UR: NEGATIVE mg/dL
Leukocytes, UA: NEGATIVE
Nitrite: NEGATIVE
PH: 5.5 (ref 5.0–8.0)
Protein, ur: NEGATIVE mg/dL
Specific Gravity, Urine: 1.004 — ABNORMAL LOW (ref 1.005–1.030)

## 2014-11-26 LAB — I-STAT CHEM 8, ED
BUN: 10 mg/dL (ref 6–20)
CALCIUM ION: 1.2 mmol/L (ref 1.12–1.23)
CREATININE: 0.8 mg/dL (ref 0.44–1.00)
Chloride: 102 mmol/L (ref 101–111)
Glucose, Bld: 103 mg/dL — ABNORMAL HIGH (ref 65–99)
HEMATOCRIT: 39 % (ref 36.0–46.0)
HEMOGLOBIN: 13.3 g/dL (ref 12.0–15.0)
Potassium: 3.9 mmol/L (ref 3.5–5.1)
Sodium: 138 mmol/L (ref 135–145)
TCO2: 24 mmol/L (ref 0–100)

## 2014-11-26 LAB — LIPASE, BLOOD: Lipase: 55 U/L — ABNORMAL HIGH (ref 11–51)

## 2014-11-26 LAB — I-STAT CG4 LACTIC ACID, ED: LACTIC ACID, VENOUS: 0.98 mmol/L (ref 0.5–2.0)

## 2014-11-26 SURGERY — COLONOSCOPY
Anesthesia: Monitor Anesthesia Care

## 2014-11-26 MED ORDER — MORPHINE SULFATE (PF) 4 MG/ML IV SOLN
4.0000 mg | Freq: Once | INTRAVENOUS | Status: AC
  Administered 2014-11-26: 4 mg via INTRAVENOUS
  Filled 2014-11-26: qty 1

## 2014-11-26 MED ORDER — HYDROMORPHONE HCL 1 MG/ML IJ SOLN
1.0000 mg | Freq: Once | INTRAMUSCULAR | Status: AC
  Administered 2014-11-26: 1 mg via INTRAVENOUS
  Filled 2014-11-26: qty 1

## 2014-11-26 MED ORDER — OXYCODONE HCL 5 MG PO TABS: 5.0000 mg | ORAL_TABLET | ORAL | Status: DC | PRN

## 2014-11-26 MED ORDER — SODIUM CHLORIDE 0.9 % IV SOLN
1000.0000 mL | Freq: Once | INTRAVENOUS | Status: AC
  Administered 2014-11-26: 1000 mL via INTRAVENOUS

## 2014-11-26 MED ORDER — IOHEXOL 300 MG/ML  SOLN
100.0000 mL | Freq: Once | INTRAMUSCULAR | Status: AC | PRN
  Administered 2014-11-26: 100 mL via INTRAVENOUS

## 2014-11-26 MED ORDER — ONDANSETRON HCL 4 MG/2ML IJ SOLN
4.0000 mg | Freq: Once | INTRAMUSCULAR | Status: AC
  Administered 2014-11-26: 4 mg via INTRAVENOUS
  Filled 2014-11-26: qty 2

## 2014-11-26 NOTE — ED Provider Notes (Signed)
CSN: [id number]     Arrival date & time 11/26/14  1011 History   First MD Initiated Contact with Patient 11/26/14 1018     Chief Complaint  Patient presents with  . Abdominal Pain  . Flank Pain     (Consider location/radiation/quality/duration/timing/severity/associated sxs/prior Treatment) Patient is a 44 y.o. female presenting with abdominal pain and flank pain. The history is provided by the patient.  Abdominal Pain Pain location:  RLQ and RUQ Pain quality: sharp and shooting   Pain radiates to:  Back Pain severity:  Severe Onset quality:  Gradual Duration:  2 days Timing:  Constant Progression:  Worsening Chronicity:  New Relieved by:  Nothing Worsened by:  Nothing tried Ineffective treatments:  None tried Associated symptoms: constipation (no bm x 2 days)   Associated symptoms: no chest pain, no chills, no dysuria, no fever, no nausea, no shortness of breath and no vomiting   Flank Pain Associated symptoms include abdominal pain. Pertinent negatives include no chest pain, no headaches and no shortness of breath.   44 yo F with a chief complaints of abdominal pain. Patient has had chronic abdominal pain for the past 3 months or more. Patient has been followed by GI and had multiple imaging studies including a CTA of the abdomen, swells an MRCP which were both negative. Patient has had an upper endoscopy as well as a recent colonoscopy 3 days ago. Colonoscopy was unremarkable per the patient however she started having worsening abdominal pain afterwards. This morning woke up with severe pain about 4 in the morning. Pain is worse on the right side sharp unrelenting radiates to her back. Patient called her GI doctor who suggested she come to the emergency department for evaluation for possible perforation.  Past Medical History  Diagnosis Date  . Migraine   . Syncope, non cardiac   . DVT of lower extremity (deep venous thrombosis) ([place])   . Menorrhagia   . Dysmenorrhea   .  Endometriosis   . Fibroids   . Adenomyosis   . 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 GERD (gastroesophageal reflux disease)   . Sebaceous cyst of breast, right lower inner quadrant 10/25/2012    Excised 11/21/12   . Kidney stones    Past Surgical History  Procedure Laterality Date  . Vaginal hysterectomy    . Arthroscopic haglunds repair    . Cholecystectomy    . Laproscopic abdominal    . Esophagogastroduodenoscopy  05/02/2011    Procedure: ESOPHAGOGASTRODUODENOSCOPY (EGD);  Surgeon: Winfield Cunas., MD;  Location: [facility];  Service: Endoscopy;  Laterality: N/A;  . Colonoscopy  05/02/2011    Procedure: COLONOSCOPY;  Surgeon: Winfield Cunas., MD;  Location: [facility];  Service: Endoscopy;  Laterality: N/A;  . Cesarean section    . Laparoscopy    . Knee surgery    . Breast cyst excision Right 11/21/2012    Procedure: CYST EXCISION BREAST;  Surgeon: Haywood Lasso, MD;  Location: [place];  Service: General;  Laterality: Right;  . Esophagogastroduodenoscopy N/A 09/01/2014    Procedure: ESOPHAGOGASTRODUODENOSCOPY (EGD);  Surgeon: Laurence Spates, MD;  Location: [facility];  Service: Endoscopy;  Laterality: N/A;   Family History  Problem Relation Age of Onset  . Colon cancer Father   . Hypertension Father   . Stroke Father   . Heart disease Father   . Heart attack Father   . Colon cancer Mother   . Diabetes  Maternal Uncle   . Stroke Paternal Grandmother   . Heart attack Paternal Grandmother   . Heart attack Maternal Grandmother   . Heart attack Maternal Grandfather   . Cancer Maternal Uncle    Social History  Substance Use Topics  . Smoking status: Never Smoker   . Smokeless tobacco: Never Used  . Alcohol Use: Yes     Comment: occasionally   OB History    Gravida Para Term Preterm AB TAB SAB Ectopic Multiple Living   2 2        2      Review of Systems  Constitutional: Negative for fever and chills.  HENT: Negative  for congestion and rhinorrhea.   Eyes: Negative for redness and visual disturbance.  Respiratory: Negative for shortness of breath and wheezing.   Cardiovascular: Negative for chest pain and palpitations.  Gastrointestinal: Positive for abdominal pain and constipation (no bm x 2 days). Negative for nausea, vomiting and abdominal distention.  Genitourinary: Positive for flank pain. Negative for dysuria and urgency.  Musculoskeletal: Negative for myalgias and arthralgias.  Skin: Negative for pallor and wound.  Neurological: Negative for dizziness and headaches.      Allergies  Review of patient's allergies indicates no known allergies.  Home Medications   Prior to Admission medications   Medication Sig Start Date End Date Taking? Authorizing Provider  calcium-vitamin D 250-100 MG-UNIT per tablet Take 1 tablet by mouth daily.    Yes Historical Provider, MD  dexlansoprazole (DEXILANT) 60 MG capsule Take 60 mg by mouth daily.   Yes Historical Provider, MD  HYDROcodone-acetaminophen (NORCO/VICODIN) 5-325 MG tablet Take 1 tablet by mouth every 6 (six) hours as needed for moderate pain.   Yes Historical Provider, MD  levocetirizine (XYZAL) 5 MG tablet Take 5 mg by mouth every evening.   Yes Historical Provider, MD  Multiple Vitamins-Minerals (MULTIVITAMIN & MINERAL PO) Take 1 tablet by mouth daily.    Yes Historical Provider, MD  ondansetron (ZOFRAN-ODT) 8 MG disintegrating tablet Take 8 mg by mouth every 8 (eight) hours as needed for nausea or vomiting.   Yes Historical Provider, MD  Probiotic Product (PROBIOTIC PO) Take 1 capsule by mouth 4 (four) times daily as needed (for GI upset).   Yes Historical Provider, MD  tamsulosin (FLOMAX) 0.4 MG CAPS capsule Take 0.4 mg by mouth.   Yes Historical Provider, MD  Azelastine-Fluticasone 137-50 MCG/ACT SUSP Place 1 spray into the nose daily.     Historical Provider, MD  cetirizine (ZYRTEC) 10 MG tablet Take 1 tablet (10 mg total) by mouth  daily. Patient not taking: Reported on 05/14/2014 10/01/11   Awilda Metro, NP  famotidine (PEPCID) 20 MG tablet Take 20 mg by mouth 2 (two) times daily.    Historical Provider, MD  ibuprofen (ADVIL,MOTRIN) 200 MG tablet Take 200 mg by mouth every 6 (six) hours as needed for moderate pain.     Historical Provider, MD  oxyCODONE (ROXICODONE) 5 MG immediate release tablet Take 1 tablet (5 mg total) by mouth every 4 (four) hours as needed for severe pain. 11/26/14   Deno Etienne, DO  oxyCODONE-acetaminophen (ROXICET) 5-325 MG per tablet Take 1 tablet by mouth every 4 (four) hours as needed. Patient not taking: Reported on 11/26/2014 11/21/12   Neldon Mc, MD   BP 96/84 mmHg  Pulse 69  Temp(Src) 98.4 F (36.9 C) (Oral)  Resp 17  Ht 5' 7.5" (1.715 m)  Wt 195 lb (88.451 kg)  BMI 30.07 kg/m2  SpO2  100% Physical Exam  Constitutional: She is oriented to person, place, and time. She appears well-developed and well-nourished. No distress.  HENT:  Head: Normocephalic and atraumatic.  Eyes: EOM are normal. Pupils are equal, round, and reactive to light.  Neck: Normal range of motion. Neck supple.  Cardiovascular: Normal rate and regular rhythm.  Exam reveals no gallop and no friction rub.   No murmur heard. Pulmonary/Chest: Effort normal. She has no wheezes. She has no rales.  Abdominal: Soft. She exhibits no distension. There is tenderness (diffuse, non focal on exam). There is no rebound and no guarding.  Musculoskeletal: She exhibits no edema or tenderness.  Neurological: She is alert and oriented to person, place, and time.  Skin: Skin is warm and dry. She is not diaphoretic.  Psychiatric: She has a normal mood and affect. Her behavior is normal.  Nursing note and vitals reviewed.   ED Course  Procedures (including critical care time) Labs Review Labs Reviewed  CBC WITH DIFFERENTIAL/PLATELET - Abnormal; Notable for the following:    RBC 3.65 (*)    Hemoglobin 11.9 (*)    HCT 35.8  (*)    All other components within normal limits  COMPREHENSIVE METABOLIC PANEL - Abnormal; Notable for the following:    Glucose, Bld 106 (*)    All other components within normal limits  LIPASE, BLOOD - Abnormal; Notable for the following:    Lipase 55 (*)    All other components within normal limits  URINALYSIS, ROUTINE W REFLEX MICROSCOPIC (NOT AT [facility]) - Abnormal; Notable for the following:    Specific Gravity, Urine 1.004 (*)    All other components within normal limits  I-STAT CHEM 8, ED - Abnormal; Notable for the following:    Glucose, Bld 103 (*)    All other components within normal limits  I-STAT CG4 LACTIC ACID, ED    Imaging Review Ct Abdomen Pelvis W Contrast  11/26/2014  CLINICAL DATA:  Pain post colonoscopy, concern for perforation. Nausea. Additional history of GERD, endometriosis, hysterectomy, cholecystectomy. EXAM: CT ABDOMEN AND PELVIS WITH CONTRAST TECHNIQUE: Multidetector CT imaging of the abdomen and pelvis was performed using the standard protocol following bolus administration of intravenous contrast. CONTRAST:  150mL OMNIPAQUE IOHEXOL 300 MG/ML  SOLN COMPARISON:  CT abdomen dated 10/08/2014. FINDINGS: The colon is normal in caliber and configuration. No colonic wall thickening or inflammation. Moderate amount of gas remains in the colon status post colonoscopy. Appendix is normal. Small bowel is normal in caliber and configuration. Patient is status post cholecystectomy. No bile duct dilatation. Liver, spleen, pancreas, and adrenal glands appear normal. There is a 3 mm nonobstructing right renal stone. No associated hydronephrosis. Left kidney appears normal without stone or hydronephrosis. No ureteral or bladder calculi identified. Patient is status post hysterectomy. Patient's prominent vaginal cuff is similar in size and configuration to the previous exam. No free fluid or abscess collection. No free intraperitoneal air. There is a small periumbilical abdominal wall  hernia which contains fat only. Abdominal aorta is normal in caliber. Lung bases are clear. No acute/significant osseous abnormality. IMPRESSION: 1. No evidence of acute intra-abdominal or intrapelvic abnormality. No evidence of bowel obstruction or bowel wall inflammation. No evidence of bowel perforation. No free intraperitoneal air. No free fluid or abscess collection. Moderate amount of air remains in the colon status post colonoscopy. 2.  3 mm nonobstructing right renal stone. Electronically Signed   By: Franki Cabot M.D.   On: 11/26/2014 12:56   Dg Abd Acute  W/chest  11/26/2014  CLINICAL DATA:  Abdominal pain and nausea and vomiting EXAM: DG ABDOMEN ACUTE W/ 1V CHEST COMPARISON:  None. FINDINGS: There is no evidence of dilated bowel loops or free intraperitoneal air. No radiopaque calculi or other significant radiographic abnormality is seen. Heart size and mediastinal contours are within normal limits. Both lungs are clear. IMPRESSION: No acute abnormality noted. Electronically Signed   By: Inez Catalina M.D.   On: 11/26/2014 12:22   I have personally reviewed and evaluated these images and lab results as part of my medical decision-making.   EKG Interpretation None      MDM   Final diagnoses:  Abdominal pain, unspecified abdominal location    44 yo F with a chief complaint of abdominal pain. Patient having severe nonfocal tenderness on exam. Will obtain a initial acute abdominal series likely needing CT scan for further evaluation.  Acute abdominal series unremarkable. CT scan performed no significant findings other than mild gaseous distention of the intestines profusely. Patient with a right renal stone. Discussed results with patient. She will follow-up with her gastroenterologist. Pain is well-controlled with 4 mg of morphine 1 mg of Dilaudid.  1:31 PM:  I have discussed the diagnosis/risks/treatment options with the patient and family and believe the pt to be eligible for  discharge home to follow-up with GI. We also discussed returning to the ED immediately if new or worsening sx occur. We discussed the sx which are most concerning (e.g., sudden worsening pain, fever, inability to tolerate by mouth) that necessitate immediate return. Medications administered to the patient during their visit and any new prescriptions provided to the patient are listed below.  Medications given during this visit Medications  0.9 %  sodium chloride infusion (0 mLs Intravenous Stopped 11/26/14 1142)  morphine 4 MG/ML injection 4 mg (4 mg Intravenous Given 11/26/14 1042)  ondansetron (ZOFRAN) injection 4 mg (4 mg Intravenous Given 11/26/14 1042)  HYDROmorphone (DILAUDID) injection 1 mg (1 mg Intravenous Given 11/26/14 1116)  iohexol (OMNIPAQUE) 300 MG/ML solution 100 mL (100 mLs Intravenous Contrast Given 11/26/14 1210)    New Prescriptions   OXYCODONE (ROXICODONE) 5 MG IMMEDIATE RELEASE TABLET    Take 1 tablet (5 mg total) by mouth every 4 (four) hours as needed for severe pain.    The patient appears reasonably screen and/or stabilized for discharge and I doubt any other medical condition or other [facility] requiring further screening, evaluation, or treatment in the ED at this time prior to discharge.    Deno Etienne, DO 11/26/14 1331

## 2014-11-26 NOTE — ED Notes (Signed)
Patient transported to CT 

## 2014-11-26 NOTE — Discharge Instructions (Signed)

## 2014-11-26 NOTE — ED Notes (Signed)
Patient states had colonoscopy on Monday with [facility].   Patient states since then some mild pain with worsening this morning in lower abdomen and bilateral flank pain.   Patient states she had been diagnosed with kidney stones x 2 weeks ago, but thought she had passed.  Patient called [facility] this morning who advised to come to ED for possible perforation.

## 2014-12-04 ENCOUNTER — Other Ambulatory Visit (HOSPITAL_COMMUNITY): Payer: 59

## 2015-01-08 DIAGNOSIS — R0602 Shortness of breath: Secondary | ICD-10-CM | POA: Diagnosis not present

## 2015-01-08 DIAGNOSIS — R112 Nausea with vomiting, unspecified: Secondary | ICD-10-CM | POA: Diagnosis not present

## 2015-01-08 DIAGNOSIS — R109 Unspecified abdominal pain: Secondary | ICD-10-CM | POA: Diagnosis not present

## 2015-01-12 DIAGNOSIS — H524 Presbyopia: Secondary | ICD-10-CM | POA: Diagnosis not present

## 2015-01-12 DIAGNOSIS — H52221 Regular astigmatism, right eye: Secondary | ICD-10-CM | POA: Diagnosis not present

## 2015-01-12 DIAGNOSIS — H5213 Myopia, bilateral: Secondary | ICD-10-CM | POA: Diagnosis not present

## 2015-01-14 DIAGNOSIS — N803 Endometriosis of pelvic peritoneum: Secondary | ICD-10-CM | POA: Diagnosis not present

## 2015-01-14 DIAGNOSIS — Z800 Family history of malignant neoplasm of digestive organs: Secondary | ICD-10-CM | POA: Diagnosis not present

## 2015-01-14 DIAGNOSIS — R109 Unspecified abdominal pain: Secondary | ICD-10-CM | POA: Diagnosis not present

## 2015-01-14 MED FILL — HYDROCODON-APAP 5-325: 5-325 | 8 days supply | Qty: 50 | Fill #0

## 2015-01-14 MED FILL — LEVOCETIRIZINE 5 MG TABLET: 5 | 90 days supply | Qty: 90 | Fill #1

## 2015-01-27 DIAGNOSIS — Z800 Family history of malignant neoplasm of digestive organs: Secondary | ICD-10-CM | POA: Insufficient documentation

## 2015-01-27 DIAGNOSIS — R109 Unspecified abdominal pain: Secondary | ICD-10-CM | POA: Diagnosis not present

## 2015-01-27 DIAGNOSIS — R112 Nausea with vomiting, unspecified: Secondary | ICD-10-CM | POA: Diagnosis not present

## 2015-01-27 DIAGNOSIS — K582 Mixed irritable bowel syndrome: Secondary | ICD-10-CM | POA: Diagnosis not present

## 2015-01-27 MED FILL — busPIRone HCL 10 MG TABS: 10 | 30 days supply | Qty: 90 | Fill #0

## 2015-01-27 MED FILL — XIFAXAN 550 MG TABLET: 550 | 14 days supply | Qty: 42 | Fill #0

## 2015-02-17 ENCOUNTER — Encounter: Payer: Self-pay | Admitting: Skilled Nursing Facility1

## 2015-02-17 ENCOUNTER — Encounter: Payer: 59 | Attending: Internal Medicine | Admitting: Skilled Nursing Facility1

## 2015-02-17 VITALS — Ht 67.0 in | Wt 193.0 lb

## 2015-02-17 DIAGNOSIS — K2100 Gastro-esophageal reflux disease with esophagitis, without bleeding: Secondary | ICD-10-CM

## 2015-02-17 DIAGNOSIS — Z713 Dietary counseling and surveillance: Secondary | ICD-10-CM | POA: Diagnosis not present

## 2015-02-17 NOTE — Progress Notes (Signed)
  Medical Nutrition Therapy:  Appt start time: 1100 end time:  1200.   Assessment:  Primary concerns today: nutrition follow up. Pt returns having seen Jeanie Sewer, RD, Larey Seat, RD and Duffy Rhody, RD for wt loss for multiple appointments. Pt would like help with current GI issues that have yet to accompany a Dx.  Pt states she is taking IB guard for IBS. Pt states she has been diagnosed with IBS with the celiac test being negative as well as many tests at [place] [facility] being negitive. Pt states she goes back to her physician in three months to revaluate a renal stone. Pt states she currently gets abdominal pain, bloating, and heart burn. Pt states she was given an antibiotic for 2 weeks. Pt states she has been doing the FODMAP diet for 3 weeks with no symptom alleviation. Pt states even on a liquid diet she had the same symptoms. Pt states all these symptoms started July/Augist 2016 at this time she was taken off the [place] but then was put back on it. Pt states at that time there were no changes in her life including: moving residences, no new pets, her home is 26 years old, and she rarely drinks tap water-she drinks bottled water. Pt states she has Lost 40 pounds since the summer 2016. Pt states she is extremely frustrated with no Dx describing why she is having GI issues and after keeping extensive symptom/food journals have yet to pinpoint specific foods/beverages causing symptoms. Pt states sometimes she will eat a food and have symptoms and then she will eat that same food again and have no symptoms. Pt states she has had several family members with GI issues some also having colon cancer: mother, father, aunts, cousin, and her daughter.   Preferred Learning Style:   No preference indicated   Learning Readiness:   Change in progress  MEDICATIONS: See List   DIETARY INTAKE:  Usual eating pattern includes varies meals and varies snacks per day.  Everyday foods include  varies.  Avoided foods include varies.    24-hr recall:  B ( AM): smoothie: banana, vanilla almond milk, pineapples, strawberries Snk ( AM): none----deli meat and gluten free crackers  L ( PM): sweet potatoes and deli ham----ham sandwich Snk ( PM): fruit-----gluten free cookies D ( PM): burger and reduced fat chips Snk ( PM): popcorn  Beverages: soda, water, flavoring packets  Usual physical activity: ADL's  Estimated energy needs: 1600 calories 180 g carbohydrates 120 g protein 44 g fat  Progress Towards Goal(s):  In progress.   Nutritional Diagnosis:  [id number] Predicted suboptimal nutrient intake As related to unidentified GI issues.  As evidenced by diarhhea, bloating, heart burn.    Intervention:  Nutrition counseling for GI issues. Dietitian educated the pt on GI rest and listened to the pt vent. Dietitian agreed with the pt that she should see a geneticist to enlighten her on her medical issues.  Goals: -Try kefir  (try original kefir in your smoothie first) -Try to chew about 20 times each bite -More fish, [place], chicken instead of beef/deli meat  Teaching Method Utilized:  Visual Auditory  Handouts given during visit include:  Colitis handout  Barriers to learning/adherence to lifestyle change: medical issues   Demonstrated degree of understanding via:  Teach Back   Monitoring/Evaluation:  Dietary intake, exercise, and body weight prn.

## 2015-02-17 NOTE — Patient Instructions (Addendum)
-  Try kefir -Try to chew about 20 times each bite -More fish, [place], chicken instead of beef/deli meat

## 2015-02-20 DIAGNOSIS — R109 Unspecified abdominal pain: Secondary | ICD-10-CM | POA: Diagnosis not present

## 2015-02-20 DIAGNOSIS — K582 Mixed irritable bowel syndrome: Secondary | ICD-10-CM | POA: Diagnosis not present

## 2015-02-20 DIAGNOSIS — K219 Gastro-esophageal reflux disease without esophagitis: Secondary | ICD-10-CM | POA: Diagnosis not present

## 2015-02-27 MED FILL — ALIGN 4 MG CAPSULE: 28 days supply | Qty: 28 | Fill #0

## 2015-03-04 DIAGNOSIS — H10413 Chronic giant papillary conjunctivitis, bilateral: Secondary | ICD-10-CM | POA: Diagnosis not present

## 2015-03-04 DIAGNOSIS — H1131 Conjunctival hemorrhage, right eye: Secondary | ICD-10-CM | POA: Diagnosis not present

## 2015-03-04 DIAGNOSIS — H04123 Dry eye syndrome of bilateral lacrimal glands: Secondary | ICD-10-CM | POA: Diagnosis not present

## 2015-03-13 MED FILL — DEXILANT DR 60 MG CAPSULE: 60 | 90 days supply | Qty: 90 | Fill #1

## 2015-03-16 DIAGNOSIS — N200 Calculus of kidney: Secondary | ICD-10-CM | POA: Diagnosis not present

## 2015-03-16 DIAGNOSIS — Z0000 Encounter for general adult medical examination without abnormal findings: Secondary | ICD-10-CM | POA: Diagnosis not present

## 2015-03-17 DIAGNOSIS — H16223 Keratoconjunctivitis sicca, not specified as Sjogren's, bilateral: Secondary | ICD-10-CM | POA: Diagnosis not present

## 2015-03-17 DIAGNOSIS — H04123 Dry eye syndrome of bilateral lacrimal glands: Secondary | ICD-10-CM | POA: Diagnosis not present

## 2015-03-18 MED FILL — XIIDRA 5% EYE DROPS: 5 | 30 days supply | Qty: 60 | Fill #0

## 2015-03-27 DIAGNOSIS — Z0000 Encounter for general adult medical examination without abnormal findings: Secondary | ICD-10-CM | POA: Diagnosis not present

## 2015-03-27 MED FILL — ALIGN 4 MG CAPSULE: 28 days supply | Qty: 28 | Fill #1

## 2015-04-20 DIAGNOSIS — K219 Gastro-esophageal reflux disease without esophagitis: Secondary | ICD-10-CM | POA: Diagnosis not present

## 2015-04-20 DIAGNOSIS — K582 Mixed irritable bowel syndrome: Secondary | ICD-10-CM | POA: Diagnosis not present

## 2015-04-27 MED FILL — ALIGN 4 MG CAPSULE: 28 days supply | Qty: 28 | Fill #2

## 2015-05-01 DIAGNOSIS — H16223 Keratoconjunctivitis sicca, not specified as Sjogren's, bilateral: Secondary | ICD-10-CM | POA: Diagnosis not present

## 2015-05-15 MED FILL — XIIDRA 5% EYE DROPS: 5 | 30 days supply | Qty: 60 | Fill #1

## 2015-05-15 MED FILL — ALIGN 4 MG CAPSULE: 28 days supply | Qty: 28 | Fill #3

## 2015-05-25 DIAGNOSIS — A084 Viral intestinal infection, unspecified: Secondary | ICD-10-CM | POA: Diagnosis not present

## 2015-06-15 MED FILL — ALIGN 4 MG CAPSULE: 28 days supply | Qty: 28 | Fill #4

## 2015-06-16 MED FILL — DEXILANT DR 60 MG CAPSULE: 60 | 90 days supply | Qty: 90 | Fill #0

## 2015-07-17 DIAGNOSIS — K582 Mixed irritable bowel syndrome: Secondary | ICD-10-CM | POA: Diagnosis not present

## 2015-07-17 DIAGNOSIS — K219 Gastro-esophageal reflux disease without esophagitis: Secondary | ICD-10-CM | POA: Diagnosis not present

## 2015-07-17 DIAGNOSIS — L309 Dermatitis, unspecified: Secondary | ICD-10-CM | POA: Diagnosis not present

## 2015-07-21 DIAGNOSIS — L309 Dermatitis, unspecified: Secondary | ICD-10-CM | POA: Diagnosis not present

## 2015-07-21 MED FILL — FLUOCINONIDE 0.05% CREAM: 0.05 | 30 days supply | Qty: 120 | Fill #0

## 2015-07-22 MED FILL — XIIDRA 5% EYE DROPS: 5 | 30 days supply | Qty: 60 | Fill #2

## 2015-08-03 DIAGNOSIS — R05 Cough: Secondary | ICD-10-CM | POA: Diagnosis not present

## 2015-08-07 DIAGNOSIS — R490 Dysphonia: Secondary | ICD-10-CM | POA: Diagnosis not present

## 2015-08-07 DIAGNOSIS — R1314 Dysphagia, pharyngoesophageal phase: Secondary | ICD-10-CM | POA: Diagnosis not present

## 2015-08-26 MED FILL — DEXILANT DR 60 MG CAPSULE: 60 | 60 days supply | Qty: 60 | Fill #1

## 2015-08-26 MED FILL — ALIGN 4 MG CAPSULE: 28 days supply | Qty: 28 | Fill #5

## 2015-09-01 DIAGNOSIS — H16223 Keratoconjunctivitis sicca, not specified as Sjogren's, bilateral: Secondary | ICD-10-CM | POA: Diagnosis not present

## 2015-10-02 DIAGNOSIS — L918 Other hypertrophic disorders of the skin: Secondary | ICD-10-CM | POA: Diagnosis not present

## 2015-10-02 DIAGNOSIS — D485 Neoplasm of uncertain behavior of skin: Secondary | ICD-10-CM | POA: Diagnosis not present

## 2015-10-02 DIAGNOSIS — L720 Epidermal cyst: Secondary | ICD-10-CM | POA: Diagnosis not present

## 2015-10-02 DIAGNOSIS — L821 Other seborrheic keratosis: Secondary | ICD-10-CM | POA: Diagnosis not present

## 2015-10-02 DIAGNOSIS — L700 Acne vulgaris: Secondary | ICD-10-CM | POA: Diagnosis not present

## 2015-10-02 MED FILL — XIIDRA 5% EYE DROPS: 5 | 30 days supply | Qty: 60 | Fill #3

## 2015-10-12 MED FILL — CLINDAMYCIN PH 1% GEL: 1 | 30 days supply | Qty: 30 | Fill #0

## 2015-10-14 DIAGNOSIS — K582 Mixed irritable bowel syndrome: Secondary | ICD-10-CM | POA: Diagnosis not present

## 2015-10-14 DIAGNOSIS — K589 Irritable bowel syndrome without diarrhea: Secondary | ICD-10-CM | POA: Diagnosis not present

## 2015-10-14 DIAGNOSIS — K219 Gastro-esophageal reflux disease without esophagitis: Secondary | ICD-10-CM | POA: Diagnosis not present

## 2015-10-14 DIAGNOSIS — H8113 Benign paroxysmal vertigo, bilateral: Secondary | ICD-10-CM | POA: Diagnosis not present

## 2015-10-14 MED FILL — ACZONE 7.5% GEL PUMP: 7.5 | 60 days supply | Qty: 60 | Fill #0

## 2015-10-21 DIAGNOSIS — K582 Mixed irritable bowel syndrome: Secondary | ICD-10-CM | POA: Diagnosis not present

## 2015-10-21 DIAGNOSIS — K219 Gastro-esophageal reflux disease without esophagitis: Secondary | ICD-10-CM | POA: Diagnosis not present

## 2015-10-21 DIAGNOSIS — R1310 Dysphagia, unspecified: Secondary | ICD-10-CM | POA: Diagnosis not present

## 2015-11-04 ENCOUNTER — Encounter (HOSPITAL_COMMUNITY)
Admission: RE | Disposition: A | Discharge status: Home / Self Care | Payer: Self-pay | Source: Ambulatory Visit | Attending: Gastroenterology

## 2015-11-04 ENCOUNTER — Ambulatory Visit (HOSPITAL_COMMUNITY)
Admission: RE | Admit: 2015-11-04 | Discharge: 2015-11-04 | Disposition: A | Discharge status: Home / Self Care | Payer: 59 | Source: Ambulatory Visit | Attending: Gastroenterology | Admitting: Gastroenterology

## 2015-11-04 DIAGNOSIS — R12 Heartburn: Secondary | ICD-10-CM | POA: Insufficient documentation

## 2015-11-04 DIAGNOSIS — R1310 Dysphagia, unspecified: Secondary | ICD-10-CM | POA: Diagnosis not present

## 2015-11-04 HISTORY — PX: PH IMPEDANCE STUDY: SHX5565

## 2015-11-04 HISTORY — PX: ESOPHAGEAL MANOMETRY: SHX5429

## 2015-11-04 SURGERY — MANOMETRY, ESOPHAGUS

## 2015-11-04 MED ORDER — LIDOCAINE VISCOUS 2 % MT SOLN
OROMUCOSAL | Status: AC
  Filled 2015-11-04: qty 15

## 2015-11-04 SURGICAL SUPPLY — 2 items
FACESHIELD LNG OPTICON STERILE (SAFETY) IMPLANT
GLOVE BIO SURGEON STRL SZ8 (GLOVE) ×4 IMPLANT

## 2015-11-04 NOTE — Progress Notes (Signed)
Esophageal Manometry done per protocol. Pt tolerated well without complication. PH with impedance probe inserted without difficulty and per protocol. Teaching done using teach back regarding study and monitor. Pt verbalized understanding. Husband at bedside for support. Reports to be sent to Dr. Johnnette Gourd office.

## 2015-11-05 ENCOUNTER — Encounter (HOSPITAL_COMMUNITY): Payer: Self-pay | Admitting: Gastroenterology

## 2015-11-10 DIAGNOSIS — K219 Gastro-esophageal reflux disease without esophagitis: Secondary | ICD-10-CM | POA: Diagnosis not present

## 2015-11-10 DIAGNOSIS — R1310 Dysphagia, unspecified: Secondary | ICD-10-CM | POA: Diagnosis not present

## 2015-11-13 ENCOUNTER — Other Ambulatory Visit: Payer: Self-pay | Admitting: Gastroenterology

## 2015-11-13 DIAGNOSIS — K219 Gastro-esophageal reflux disease without esophagitis: Secondary | ICD-10-CM

## 2015-11-18 ENCOUNTER — Other Ambulatory Visit: Payer: Self-pay | Admitting: Gastroenterology

## 2015-11-18 DIAGNOSIS — K219 Gastro-esophageal reflux disease without esophagitis: Secondary | ICD-10-CM

## 2015-12-01 ENCOUNTER — Other Ambulatory Visit: Payer: Self-pay | Admitting: Internal Medicine

## 2015-12-01 DIAGNOSIS — Z1231 Encounter for screening mammogram for malignant neoplasm of breast: Secondary | ICD-10-CM

## 2015-12-01 MED FILL — XIIDRA 5% EYE DROPS: 5 | 90 days supply | Qty: 180 | Fill #4

## 2015-12-01 MED FILL — ALIGN 4 MG CAPSULE: 28 days supply | Qty: 28 | Fill #6

## 2015-12-02 ENCOUNTER — Ambulatory Visit
Admission: RE | Admit: 2015-12-02 | Discharge: 2015-12-02 | Disposition: A | Discharge status: Home / Self Care | Payer: 59 | Source: Ambulatory Visit | Attending: Internal Medicine | Admitting: Internal Medicine

## 2015-12-02 DIAGNOSIS — Z1231 Encounter for screening mammogram for malignant neoplasm of breast: Secondary | ICD-10-CM | POA: Diagnosis not present

## 2015-12-03 ENCOUNTER — Ambulatory Visit
Admission: RE | Admit: 2015-12-03 | Discharge: 2015-12-03 | Disposition: A | Discharge status: Home / Self Care | Payer: 59 | Source: Ambulatory Visit | Attending: Gastroenterology | Admitting: Gastroenterology

## 2015-12-03 DIAGNOSIS — K219 Gastro-esophageal reflux disease without esophagitis: Secondary | ICD-10-CM

## 2015-12-03 DIAGNOSIS — R109 Unspecified abdominal pain: Secondary | ICD-10-CM | POA: Diagnosis not present

## 2015-12-03 MED ORDER — GADOBENATE DIMEGLUMINE 529 MG/ML IV SOLN
18.0000 mL | Freq: Once | INTRAVENOUS | Status: AC | PRN
  Administered 2015-12-03: 18 mL via INTRAVENOUS

## 2015-12-08 DIAGNOSIS — Z6831 Body mass index (BMI) 31.0-31.9, adult: Secondary | ICD-10-CM | POA: Diagnosis not present

## 2015-12-08 DIAGNOSIS — Z01411 Encounter for gynecological examination (general) (routine) with abnormal findings: Secondary | ICD-10-CM | POA: Diagnosis not present

## 2015-12-17 MED FILL — DEXILANT DR 60 MG CAPSULE: 60 | 90 days supply | Qty: 90 | Fill #0

## 2015-12-31 MED FILL — ALIGN 4 MG CAPSULE: 28 days supply | Qty: 28 | Fill #0

## 2016-01-19 DIAGNOSIS — H524 Presbyopia: Secondary | ICD-10-CM | POA: Diagnosis not present

## 2016-01-19 DIAGNOSIS — H5213 Myopia, bilateral: Secondary | ICD-10-CM | POA: Diagnosis not present

## 2016-01-19 DIAGNOSIS — H52223 Regular astigmatism, bilateral: Secondary | ICD-10-CM | POA: Diagnosis not present

## 2016-02-01 MED FILL — ALIGN 4 MG CAPSULE: 28 days supply | Qty: 28 | Fill #1

## 2016-02-09 DIAGNOSIS — K582 Mixed irritable bowel syndrome: Secondary | ICD-10-CM | POA: Diagnosis not present

## 2016-02-09 DIAGNOSIS — K219 Gastro-esophageal reflux disease without esophagitis: Secondary | ICD-10-CM | POA: Diagnosis not present

## 2016-03-04 MED FILL — ALIGN 4 MG CAPSULE: 28 days supply | Qty: 28 | Fill #2

## 2016-03-25 MED FILL — ALIGN 4 MG CAPSULE: 28 days supply | Qty: 28 | Fill #3

## 2016-04-08 MED FILL — DEXILANT DR 60 MG CAPSULE: 60 | 90 days supply | Qty: 90 | Fill #1

## 2016-04-26 MED FILL — ALIGN 4 MG CAPSULE: 28 days supply | Qty: 28 | Fill #4

## 2016-05-25 ENCOUNTER — Emergency Department (HOSPITAL_COMMUNITY): Payer: 59

## 2016-05-25 ENCOUNTER — Emergency Department (HOSPITAL_COMMUNITY)
Admission: EM | Admit: 2016-05-25 | Discharge: 2016-05-25 | Disposition: A | Discharge status: Home / Self Care | Payer: 59 | Attending: Emergency Medicine | Admitting: Emergency Medicine

## 2016-05-25 ENCOUNTER — Encounter (HOSPITAL_COMMUNITY): Payer: Self-pay | Admitting: Nurse Practitioner

## 2016-05-25 DIAGNOSIS — R1010 Upper abdominal pain, unspecified: Secondary | ICD-10-CM | POA: Diagnosis not present

## 2016-05-25 DIAGNOSIS — Z79899 Other long term (current) drug therapy: Secondary | ICD-10-CM | POA: Diagnosis not present

## 2016-05-25 DIAGNOSIS — K449 Diaphragmatic hernia without obstruction or gangrene: Secondary | ICD-10-CM | POA: Diagnosis not present

## 2016-05-25 DIAGNOSIS — R1084 Generalized abdominal pain: Secondary | ICD-10-CM | POA: Diagnosis not present

## 2016-05-25 LAB — URINALYSIS, ROUTINE W REFLEX MICROSCOPIC
BILIRUBIN URINE: NEGATIVE
GLUCOSE, UA: NEGATIVE mg/dL
HGB URINE DIPSTICK: NEGATIVE
Ketones, ur: 5 mg/dL — AB
LEUKOCYTES UA: NEGATIVE
NITRITE: NEGATIVE
Protein, ur: NEGATIVE mg/dL
SPECIFIC GRAVITY, URINE: 1.015 (ref 1.005–1.030)
pH: 7 (ref 5.0–8.0)

## 2016-05-25 LAB — COMPREHENSIVE METABOLIC PANEL
ALBUMIN: 3.9 g/dL (ref 3.5–5.0)
ALT: 19 U/L (ref 14–54)
AST: 27 U/L (ref 15–41)
Alkaline Phosphatase: 76 U/L (ref 38–126)
Anion gap: 9 (ref 5–15)
BILIRUBIN TOTAL: 1.2 mg/dL (ref 0.3–1.2)
BUN: 9 mg/dL (ref 6–20)
CO2: 24 mmol/L (ref 22–32)
Calcium: 9.5 mg/dL (ref 8.9–10.3)
Chloride: 105 mmol/L (ref 101–111)
Creatinine, Ser: 0.72 mg/dL (ref 0.44–1.00)
GFR calc Af Amer: >60 mL/min (ref >60)
GLUCOSE: 91 mg/dL (ref 65–99)
POTASSIUM: 3.8 mmol/L (ref 3.5–5.1)
Sodium: 138 mmol/L (ref 135–145)
TOTAL PROTEIN: 7.5 g/dL (ref 6.5–8.1)

## 2016-05-25 LAB — CBC
HEMATOCRIT: 40 % (ref 36.0–46.0)
Hemoglobin: 13.1 g/dL (ref 12.0–15.0)
MCH: 33.1 pg (ref 26.0–34.0)
MCHC: 32.8 g/dL (ref 30.0–36.0)
MCV: 101 fL — ABNORMAL HIGH (ref 78.0–100.0)
Platelets: 196 10*3/uL (ref 150–400)
RBC: 3.96 MIL/uL (ref 3.87–5.11)
RDW: 12.7 % (ref 11.5–15.5)
WBC: 5.7 10*3/uL (ref 4.0–10.5)

## 2016-05-25 LAB — POC OCCULT BLOOD, ED: Fecal Occult Bld: NEGATIVE

## 2016-05-25 MED ORDER — MORPHINE SULFATE (PF) 4 MG/ML IV SOLN
4.0000 mg | Freq: Once | INTRAVENOUS | Status: AC
Start: 2016-05-25 — End: 2016-05-25
  Administered 2016-05-25: 4 mg via INTRAVENOUS
  Filled 2016-05-25: qty 1

## 2016-05-25 MED ORDER — ONDANSETRON HCL 4 MG/2ML IJ SOLN
4.0000 mg | Freq: Once | INTRAMUSCULAR | Status: AC
  Administered 2016-05-25: 4 mg via INTRAVENOUS
  Filled 2016-05-25: qty 2

## 2016-05-25 MED ORDER — IOPAMIDOL (ISOVUE-300) INJECTION 61%
INTRAVENOUS | Status: AC
  Administered 2016-05-25: 100 mL
  Filled 2016-05-25: qty 100

## 2016-05-25 MED ORDER — DICYCLOMINE HCL 20 MG PO TABS: 20.0000 mg | ORAL_TABLET | Freq: Two times a day (BID) | ORAL | 0 refills | Status: DC

## 2016-05-25 NOTE — ED Provider Notes (Signed)
[facility] DEPT Provider Note   CSN: [id number] Arrival date & time: 05/25/16  1646     History   Chief Complaint Chief Complaint  Patient presents with  . Abdominal Pain    HPI Karen [name] is a 46 y.o. female.  HPI   46 year old female with history of GERD, fibroid, endometriosis, kidney stone presenting complaining of abdominal pain. Patient states she has a lot of GI issues and have been follow-up with her GI specialist for evaluation without any specific diagnosis. Since a.m. this morning she has had persistent generalized abdominal pain. She described pain as a twisting numbness sensation with nausea without vomiting. She has a small bowel movement this morning, and since then unable to pass flatus. Have the urge to have BM without success. She was seen by her GI specialist, Dr. Oletta Lamas who felt that patient should come to the ER to rule out for small bowel obstruction. Patient has had multiple abdominal surgeries in the past. She reports feeling constipated when she went down to [place] this past week. She used Fleet enema several days prior with moderate success. She admits to history of IBS and has had GI symptoms "for a long time". She denies having fever, chills, chest pain, shortness of breath, productive cough, back pain, dysuria, hematuria, urinary retention, or rash.  Past Medical History:  Diagnosis Date  . Adenomyosis   . Arthritis 2013   L knee gets steroid injections   . DVT of lower extremity (deep venous thrombosis) ([place])   . Dysmenorrhea   . Endometriosis   . Fibroids   . GERD (gastroesophageal reflux disease)   . Kidney stones   . Menorrhagia   . Migraine   . Sebaceous cyst of breast, right lower inner quadrant 10/25/2012   Excised 11/21/12   . Syncope, non cardiac     Patient Active Problem List   Diagnosis Date Noted  . Chest pain 12/13/2012  . Dyspnea 12/13/2012    Past Surgical History:  Procedure Laterality Date  . ARTHROSCOPIC  HAGLUNDS REPAIR    . BREAST CYST EXCISION Right 11/21/2012   Procedure: CYST EXCISION BREAST;  Surgeon: Haywood Lasso, MD;  Location: [facility];  Service: General;  Laterality: Right;  . CESAREAN SECTION    . CHOLECYSTECTOMY    . COLONOSCOPY  05/02/2011   Procedure: COLONOSCOPY;  Surgeon: Winfield Cunas., MD;  Location: [facility];  Service: Endoscopy;  Laterality: N/A;  . ESOPHAGEAL MANOMETRY N/A 11/04/2015   Procedure: ESOPHAGEAL MANOMETRY (EM);  Surgeon: Laurence Spates, MD;  Location: [facility];  Service: Endoscopy;  Laterality: N/A;  . ESOPHAGOGASTRODUODENOSCOPY  05/02/2011   Procedure: ESOPHAGOGASTRODUODENOSCOPY (EGD);  Surgeon: Winfield Cunas., MD;  Location: [facility];  Service: Endoscopy;  Laterality: N/A;  . ESOPHAGOGASTRODUODENOSCOPY N/A 09/01/2014   Procedure: ESOPHAGOGASTRODUODENOSCOPY (EGD);  Surgeon: Laurence Spates, MD;  Location: [facility];  Service: Endoscopy;  Laterality: N/A;  . KNEE SURGERY    . LAPAROSCOPY    . laproscopic abdominal    . [place] IMPEDANCE STUDY N/A 11/04/2015   Procedure: [place] IMPEDANCE STUDY;  Surgeon: Laurence Spates, MD;  Location: [facility];  Service: Endoscopy;  Laterality: N/A;  . VAGINAL HYSTERECTOMY      OB History    Gravida Para Term Preterm AB Living   2 2       2    SAB TAB Ectopic Multiple Live Births  Home Medications    Prior to Admission medications   Medication Sig Start Date End Date Taking? Authorizing Provider  Azelastine-Fluticasone 137-50 MCG/ACT SUSP Place 1 spray into the nose daily.     [provider]  calcium-vitamin D 250-100 MG-UNIT per tablet Take 1 tablet by mouth daily.     [provider]  cetirizine (ZYRTEC) 10 MG tablet Take 1 tablet (10 mg total) by mouth daily. Patient not taking: Reported on 05/14/2014 10/01/11   Awilda Metro, NP  dexlansoprazole (DEXILANT) 60 MG capsule Take 60 mg by mouth daily.    [provider]  famotidine  (PEPCID) 20 MG tablet Take 20 mg by mouth 2 (two) times daily.    [provider]  HYDROcodone-acetaminophen (NORCO/VICODIN) 5-325 MG tablet Take 1 tablet by mouth every 6 (six) hours as needed for moderate pain.    [provider]  ibuprofen (ADVIL,MOTRIN) 200 MG tablet Take 200 mg by mouth every 6 (six) hours as needed for moderate pain.     [provider]  levocetirizine (XYZAL) 5 MG tablet Take 5 mg by mouth every evening.    [provider]  Multiple Vitamins-Minerals (MULTIVITAMIN & MINERAL PO) Take 1 tablet by mouth daily.     [provider]  ondansetron (ZOFRAN-ODT) 8 MG disintegrating tablet Take 8 mg by mouth every 8 (eight) hours as needed for nausea or vomiting.    [provider]  oxyCODONE (ROXICODONE) 5 MG immediate release tablet Take 1 tablet (5 mg total) by mouth every 4 (four) hours as needed for severe pain. 11/26/14   Deno Etienne, DO  oxyCODONE-acetaminophen (ROXICET) 5-325 MG per tablet Take 1 tablet by mouth every 4 (four) hours as needed. Patient not taking: Reported on 11/26/2014 11/21/12   Neldon Mc, MD  Probiotic Product (PROBIOTIC PO) Take 1 capsule by mouth 4 (four) times daily as needed (for GI upset).    [provider]  tamsulosin (FLOMAX) 0.4 MG CAPS capsule Take 0.4 mg by mouth.    [provider]    Family History Family History  Problem Relation Age of Onset  . Colon cancer Father   . Hypertension Father   . Stroke Father   . Heart disease Father   . Heart attack Father   . Colon cancer Mother   . Diabetes Maternal Uncle   . Stroke Paternal Grandmother   . Heart attack Paternal Grandmother   . Heart attack Maternal Grandmother   . Heart attack Maternal Grandfather   . Cancer Maternal Uncle     Social History Social History  Substance Use Topics  . Smoking status: Never Smoker  . Smokeless tobacco: Never Used  . Alcohol use Yes     Comment: occasionally      Allergies   Patient has no known allergies.   Review of Systems Review of Systems  All other systems reviewed and are negative.    Physical Exam Updated Vital Signs BP 138/85   Pulse 79   Temp 98.5 F (36.9 C) (Oral)   Resp 20   SpO2 100%   Physical Exam  Constitutional: She appears well-developed and well-nourished.  Obese female appeared uncomfortable but nontoxic  HENT:  Head: Atraumatic.  Mouth/Throat: Oropharynx is clear and moist.  Eyes: Conjunctivae are normal.  Neck: Neck supple.  Cardiovascular: Normal rate and regular rhythm.   Pulmonary/Chest: Effort normal and breath sounds normal.  Abdominal: Soft. There is tenderness.  Decreased bowel sounds. Generalized abdominal tenderness without guarding  or rebound tenderness  Genitourinary:  Genitourinary Comments: Chaperone present during exam. Normal rectal tone, no external thrombosed hemorrhoid or anal fissure. No fecal impaction, normal color stool on glove, no mass.  Neurological: She is alert.  Skin: No rash noted.  Psychiatric: She has a normal mood and affect.  Nursing note and vitals reviewed.    ED Treatments / Results  Labs (all labs ordered are listed, but only abnormal results are displayed) Labs Reviewed  CBC - Abnormal; Notable for the following:       Result Value   MCV 101.0 (*)    All other components within normal limits  URINALYSIS, ROUTINE W REFLEX MICROSCOPIC - Abnormal; Notable for the following:    Ketones, ur 5 (*)    All other components within normal limits  COMPREHENSIVE METABOLIC PANEL  POC OCCULT BLOOD, ED    EKG  EKG Interpretation None       Radiology Ct Abdomen Pelvis W Contrast  Result Date: 05/25/2016 CLINICAL DATA:  Acute onset of upper abdominal cramping pain. Diaphoresis and nausea. Initial encounter. EXAM: CT ABDOMEN AND PELVIS WITH CONTRAST TECHNIQUE: Multidetector CT imaging of the abdomen and pelvis was performed using the standard protocol following  bolus administration of intravenous contrast. CONTRAST:  148mL ISOVUE-300 IOPAMIDOL (ISOVUE-300) INJECTION 61% COMPARISON:  MRI of the abdomen and pelvis performed 12/03/2015 FINDINGS: Lower chest: The visualized lung bases are grossly clear. The visualized portions of the mediastinum are unremarkable. A tiny hiatal hernia is noted. Hepatobiliary: The liver is unremarkable in appearance. The patient is status post cholecystectomy, with clips noted at the gallbladder fossa. The common bile duct remains normal in caliber. Pancreas: The pancreas is within normal limits. Spleen: The spleen is unremarkable in appearance. Adrenals/Urinary Tract: The adrenal glands are unremarkable in appearance. A 4 mm nonobstructing stone is noted at the interpole region of the right kidney. There is no evidence of hydronephrosis. No obstructing ureteral stones are identified. No perinephric stranding is seen. Stomach/Bowel: The stomach is otherwise unremarkable in appearance. The small bowel is within normal limits. The appendix is normal in caliber, without evidence of appendicitis. The colon is unremarkable in appearance. Vascular/Lymphatic: The abdominal aorta is unremarkable in appearance. The inferior vena cava is grossly unremarkable. No retroperitoneal lymphadenopathy is seen. No pelvic sidewall lymphadenopathy is identified. Reproductive: The bladder is mildly distended and grossly unremarkable. The patient is status post hysterectomy. No suspicious adnexal masses are seen. Other: No additional soft tissue abnormalities are seen. Musculoskeletal: No acute osseous abnormalities are identified. The visualized musculature is unremarkable in appearance. IMPRESSION: 1. No acute abnormality seen to explain the patient's symptoms. 2. 4 mm nonobstructing stone at the interpole region of the right kidney. 3. Tiny hiatal hernia noted. Electronically Signed   By: Garald Balding M.D.   On: 05/25/2016 21:12    Procedures Procedures  (including critical care time)  Medications Ordered in ED Medications  morphine 4 MG/ML injection 4 mg (4 mg Intravenous Given 05/25/16 2007)  ondansetron (ZOFRAN) injection 4 mg (4 mg Intravenous Given 05/25/16 2007)  iopamidol (ISOVUE-300) 61 % injection (100 mLs  Contrast Given 05/25/16 2048)     Initial Impression / Assessment and Plan / ED Course  I have reviewed the triage vital signs and the nursing notes.  Pertinent labs & imaging results that were available during my care of the patient were reviewed by me and considered in my medical decision making (see chart for details).     BP 115/68   Pulse  71   Temp 98.5 F (36.9 C) (Oral)   Resp 20   SpO2 100%    Final Clinical Impressions(s) / ED Diagnoses   Final diagnoses:  Generalized abdominal pain    New Prescriptions New Prescriptions   DICYCLOMINE (BENTYL) 20 MG TABLET    Take 1 tablet (20 mg total) by mouth 2 (two) times daily.   10:29 PM Patient with significant abdominal pathology, monitoring by a GI specialist from [facility] sent here to rule out for small bowel obstruction after she developed abdominal pain earlier today. She does have mild generalized abdominal pain on exam without focal point tenderness. She is afebrile with stable normal vital sign. Her fecal occult test is negative, UA shows no signs of urinary tract infection, her electrolytes are reassuring, normal CBC, and her abdominal pelvis CT scan shows no acute intra-abdominal pathology. At this time, I encourage patient to follow-up with her GI specialist for further care.  Pt did inquired about her Lipase value. Lipase was not obtained today, however her pancreas is normal on CT scan.     Domenic Moras, PA-C 05/25/16 2242    Julianne Rice, MD 05/26/16 [phone]

## 2016-05-25 NOTE — ED Triage Notes (Addendum)
Pt presents with c/o abd pain. The pain began this morning. She describes as a cramping pain in her upper abdomen. She reports sweats, nausea. She denies CP, sob, vomiting, urinary changes, constipation, diarrhea. She reports a 2 year history of abdominal pain and GI problems with multiple tests and procedures.She was told by her GI doctor that her pain may be related to adhesions. Today her GI doctor sent her to ED with concern for bowel obstruction

## 2016-05-25 NOTE — ED Notes (Signed)
Pt ambulated to the restroom without difficulty was able to provide urine sample, sent to lab.

## 2016-05-25 NOTE — Discharge Instructions (Signed)
Please call and follow up with your GI specialist for further evaluation of your condition.

## 2016-05-26 DIAGNOSIS — K5900 Constipation, unspecified: Secondary | ICD-10-CM | POA: Diagnosis not present

## 2016-05-26 DIAGNOSIS — R1084 Generalized abdominal pain: Secondary | ICD-10-CM | POA: Diagnosis not present

## 2016-05-26 DIAGNOSIS — K582 Mixed irritable bowel syndrome: Secondary | ICD-10-CM | POA: Diagnosis not present

## 2016-05-29 DIAGNOSIS — R1013 Epigastric pain: Secondary | ICD-10-CM | POA: Diagnosis not present

## 2016-06-03 MED FILL — ALIGN 4 MG CAPSULE: 28 days supply | Qty: 28 | Fill #5

## 2016-06-07 ENCOUNTER — Encounter: Payer: 59 | Attending: Internal Medicine | Admitting: Skilled Nursing Facility1

## 2016-06-07 ENCOUNTER — Encounter: Payer: Self-pay | Admitting: Skilled Nursing Facility1

## 2016-06-07 DIAGNOSIS — K219 Gastro-esophageal reflux disease without esophagitis: Secondary | ICD-10-CM | POA: Insufficient documentation

## 2016-06-07 DIAGNOSIS — Z713 Dietary counseling and surveillance: Secondary | ICD-10-CM | POA: Diagnosis not present

## 2016-06-07 NOTE — Patient Instructions (Addendum)
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    -Try a new probiotic   -Stick with 1/2 cup of cooked spinach when your feeling okay   -on days you are feeling bad stick with liquid   -Keep the body in motion

## 2016-06-07 NOTE — Progress Notes (Signed)
  Medical Nutrition Therapy:  Appt start time: 1100 end time:  1200.   Assessment:  Primary concerns today: nutrition follow up.   Pt states she is taking align for a probiotic. Pt states she was in the ED 2 weeks ago with the results of eventually having a bowel obstruction due to not finding a diagnosis possibly from adhesions. Pt states she has been throwing up and having abdominal pain. Pt states after she eats she gets a full sensation with nausea, cramping in gut; she not eating for a while feeling deprived. Pt states she has been doing a strict FODMAP diet for 6 months then stopped that having soda ans sweets ever now and then, recently flarups (cramping and then bowel movement then okay) recently; going through diarrhea then constipation for a couple days. Pt states salads kill her and milk products and watching high fructose corn syrup. Pt states she drinks 10 bottles of water a day. Pt states she has had heart palpitations. Pt states iron causes constipation.  Pt is concerned due to her extensive family hx of colon cancer. Pt states her stress is under control and always has been. Pt states she has severe GERD and strictures. When doing the FODMAP: Cornflakes with almond milk [place] sausage  Water Chicken or steak with yams and spinach with water Ground sirloin and spinach with potato without the skin Fruit and protein for snack With result of less gas and less abdominal pain  Preferred Learning Style:   No preference indicated   Learning Readiness:   Change in progress  MEDICATIONS: See List   DIETARY INTAKE:  Usual eating pattern includes varies meals and varies snacks per day.  Everyday foods include varies.  Avoided foods include varies.    24-hr recall:  B ( AM): drink 20 ounces of water then 2-3 more bottles of water before work-usually not eating before work Snk ( AM):  L ( 2-2:30PM): 1/4 sweet potato and 2 ounces of chicken (marinated in mediterranean  seasonings) Snk ( PM): sugar free jello cup and then cake D ( PM): mashed potatoes with almond milk and salmon (herb and garlic sauce) Snk ( PM):  Beverages: soda, water, flavoring packets, gatorade, sweet tea for constipation  Usual physical activity: ADL's  Estimated energy needs: 1600 calories 180 g carbohydrates 120 g protein 44 g fat  Progress Towards Goal(s):  In progress.   Nutritional Diagnosis:  NI-5.11.1 Predicted suboptimal nutrient intake As related to unidentified GI issues.  As evidenced by diarhhea, bloating, heart burn.    Intervention:  Nutrition counseling for GI issues. Dietitian educated the pt on simplifying her diet and stresses connection to the gut. 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  -Try a new probiotic  -Stick with 1/2 cup of cooked spinach when your feeling okay -on days you are feeling bad stick with liquid  -Keep the body in motion  Teaching Method Utilized:  Visual Auditory  Handouts given during visit include:  Colitis handout  Barriers to learning/adherence to lifestyle change: medical GI issues   Demonstrated degree of understanding via:  Teach Back   Monitoring/Evaluation:  Dietary intake, exercise, and body weight prn.

## 2016-06-24 DIAGNOSIS — S80862A Insect bite (nonvenomous), left lower leg, initial encounter: Secondary | ICD-10-CM | POA: Diagnosis not present

## 2016-06-24 DIAGNOSIS — S80861A Insect bite (nonvenomous), right lower leg, initial encounter: Secondary | ICD-10-CM | POA: Diagnosis not present

## 2016-06-28 ENCOUNTER — Encounter: Payer: 59 | Admitting: Skilled Nursing Facility1

## 2016-06-28 DIAGNOSIS — Z713 Dietary counseling and surveillance: Secondary | ICD-10-CM | POA: Diagnosis not present

## 2016-06-28 DIAGNOSIS — K219 Gastro-esophageal reflux disease without esophagitis: Secondary | ICD-10-CM

## 2016-06-28 NOTE — Progress Notes (Signed)
  Medical Nutrition Therapy:  Appt start time: 1100 end time:  1200.   Assessment:  Primary concerns today: nutrition follow up.   Pt states she has had 2 flairs made an appointment with a surgeon today.Pt states she will eat when she can and when she cannot do liquids. The last flair was out of town having eaten at a [employer] the next [employer] and continuing for several hours. States she has had more nausea lately cornflakes almond milk, spinach, salmom, scrambled egg and [place] scramble are all okay. Almond milk ice cream for a treat which has been fine, one or 2 soda since last appiontemnt and some sweet tea. Given bariatric fisuoion pwder. 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   Preferred Learning Style:   No preference indicated   Learning Readiness:   Change in progress  MEDICATIONS: See List   DIETARY INTAKE:  Usual eating pattern includes varies meals and varies snacks per day.  Everyday foods include varies.  Avoided foods include varies.    24-hr recall:  B ( AM): drink 20 ounces of water then 2-3 more bottles of water before work-usually not eating before work Snk ( AM):  L ( 2-2:30PM): 1/4 sweet potato and 2 ounces of chicken (marinated in mediterranean seasonings) Snk ( PM): sugar free jello cup and then cake D ( PM): mashed potatoes with almond milk and salmon (herb and garlic sauce) Snk ( PM):  Beverages: soda, water, flavoring packets, gatorade, sweet tea for constipation  Usual physical activity: ADL's  Estimated energy needs: 1600 calories 180 g carbohydrates 120 g protein 44 g fat  Progress Towards Goal(s):  In progress.   Nutritional Diagnosis:  NI-5.11.1 Predicted suboptimal nutrient intake As related to unidentified GI issues.  As evidenced by diarhhea, bloating, heart burn.    Intervention:  Nutrition counseling for GI issues. Dietitian educated the pt on simplifying her diet  and stresses connection to the gut. 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  -Try a new probiotic  -Stick with 1/2 cup of cooked spinach when your feeling okay -on days you are feeling bad stick with liquid  -Keep the body in motion  Teaching Method Utilized:  Visual Auditory  Handouts given during visit include:  Colitis handout  Barriers to learning/adherence to lifestyle change: medical GI issues   Demonstrated degree of understanding via:  Teach Back   Monitoring/Evaluation:  Dietary intake, exercise, and body weight prn.

## 2016-07-07 DIAGNOSIS — R1084 Generalized abdominal pain: Secondary | ICD-10-CM | POA: Diagnosis not present

## 2016-07-07 DIAGNOSIS — G8929 Other chronic pain: Secondary | ICD-10-CM | POA: Diagnosis not present

## 2016-07-11 DIAGNOSIS — K582 Mixed irritable bowel syndrome: Secondary | ICD-10-CM | POA: Diagnosis not present

## 2016-07-11 DIAGNOSIS — R1084 Generalized abdominal pain: Secondary | ICD-10-CM | POA: Diagnosis not present

## 2016-07-11 DIAGNOSIS — K219 Gastro-esophageal reflux disease without esophagitis: Secondary | ICD-10-CM | POA: Diagnosis not present

## 2016-07-11 MED FILL — ALIGN 4 MG CAPSULE: 28 days supply | Qty: 28 | Fill #6

## 2016-07-11 MED FILL — DEXILANT DR 60 MG CAPSULE: 60 | 30 days supply | Qty: 30 | Fill #2

## 2016-07-12 MED FILL — XIIDRA 5% EYE DROPS: 5 | 90 days supply | Qty: 60 | Fill #0

## 2016-07-21 DIAGNOSIS — N200 Calculus of kidney: Secondary | ICD-10-CM | POA: Diagnosis not present

## 2016-08-03 DIAGNOSIS — R1310 Dysphagia, unspecified: Secondary | ICD-10-CM | POA: Diagnosis not present

## 2016-08-03 DIAGNOSIS — D649 Anemia, unspecified: Secondary | ICD-10-CM | POA: Diagnosis not present

## 2016-08-03 DIAGNOSIS — R109 Unspecified abdominal pain: Secondary | ICD-10-CM | POA: Diagnosis not present

## 2016-08-03 DIAGNOSIS — I82A11 Acute embolism and thrombosis of right axillary vein: Secondary | ICD-10-CM | POA: Diagnosis not present

## 2016-08-03 DIAGNOSIS — K589 Irritable bowel syndrome without diarrhea: Secondary | ICD-10-CM | POA: Diagnosis not present

## 2016-08-03 DIAGNOSIS — Z800 Family history of malignant neoplasm of digestive organs: Secondary | ICD-10-CM | POA: Diagnosis not present

## 2016-08-03 DIAGNOSIS — N809 Endometriosis, unspecified: Secondary | ICD-10-CM | POA: Diagnosis not present

## 2016-08-03 DIAGNOSIS — K56609 Unspecified intestinal obstruction, unspecified as to partial versus complete obstruction: Secondary | ICD-10-CM | POA: Diagnosis not present

## 2016-08-09 DIAGNOSIS — Z86718 Personal history of other venous thrombosis and embolism: Secondary | ICD-10-CM | POA: Diagnosis not present

## 2016-08-09 DIAGNOSIS — M533 Sacrococcygeal disorders, not elsewhere classified: Secondary | ICD-10-CM | POA: Diagnosis not present

## 2016-08-09 DIAGNOSIS — R42 Dizziness and giddiness: Secondary | ICD-10-CM | POA: Diagnosis not present

## 2016-08-09 DIAGNOSIS — R55 Syncope and collapse: Secondary | ICD-10-CM | POA: Diagnosis not present

## 2016-08-09 DIAGNOSIS — K56609 Unspecified intestinal obstruction, unspecified as to partial versus complete obstruction: Secondary | ICD-10-CM | POA: Diagnosis not present

## 2016-08-09 DIAGNOSIS — R1084 Generalized abdominal pain: Secondary | ICD-10-CM | POA: Diagnosis not present

## 2016-08-09 DIAGNOSIS — R197 Diarrhea, unspecified: Secondary | ICD-10-CM | POA: Diagnosis not present

## 2016-08-09 DIAGNOSIS — Z79899 Other long term (current) drug therapy: Secondary | ICD-10-CM | POA: Diagnosis not present

## 2016-08-09 DIAGNOSIS — K219 Gastro-esophageal reflux disease without esophagitis: Secondary | ICD-10-CM | POA: Diagnosis not present

## 2016-08-12 DIAGNOSIS — R1084 Generalized abdominal pain: Secondary | ICD-10-CM | POA: Diagnosis not present

## 2016-08-12 DIAGNOSIS — G8929 Other chronic pain: Secondary | ICD-10-CM | POA: Diagnosis not present

## 2016-08-12 MED FILL — GAVILYTE-G SOLUTION: 236 | 2 days supply | Qty: 4000 | Fill #0

## 2016-08-15 MED FILL — ALIGN 4 MG CAPSULE: 28 days supply | Qty: 28 | Fill #0

## 2016-08-16 DIAGNOSIS — R109 Unspecified abdominal pain: Secondary | ICD-10-CM | POA: Diagnosis not present

## 2016-09-15 MED FILL — ALIGN 4 MG CAPSULE: 28 days supply | Qty: 28 | Fill #1

## 2016-09-27 DIAGNOSIS — Q399 Congenital malformation of esophagus, unspecified: Secondary | ICD-10-CM | POA: Diagnosis not present

## 2016-09-27 DIAGNOSIS — E669 Obesity, unspecified: Secondary | ICD-10-CM | POA: Diagnosis not present

## 2016-09-27 DIAGNOSIS — J029 Acute pharyngitis, unspecified: Secondary | ICD-10-CM | POA: Diagnosis not present

## 2016-09-27 DIAGNOSIS — K449 Diaphragmatic hernia without obstruction or gangrene: Secondary | ICD-10-CM | POA: Diagnosis not present

## 2016-09-27 DIAGNOSIS — K317 Polyp of stomach and duodenum: Secondary | ICD-10-CM | POA: Diagnosis not present

## 2016-09-27 DIAGNOSIS — Z6830 Body mass index (BMI) 30.0-30.9, adult: Secondary | ICD-10-CM | POA: Diagnosis not present

## 2016-09-27 DIAGNOSIS — K219 Gastro-esophageal reflux disease without esophagitis: Secondary | ICD-10-CM | POA: Diagnosis not present

## 2016-09-27 DIAGNOSIS — K582 Mixed irritable bowel syndrome: Secondary | ICD-10-CM | POA: Diagnosis not present

## 2016-09-27 DIAGNOSIS — K222 Esophageal obstruction: Secondary | ICD-10-CM | POA: Diagnosis not present

## 2016-09-27 DIAGNOSIS — Z86718 Personal history of other venous thrombosis and embolism: Secondary | ICD-10-CM | POA: Diagnosis not present

## 2016-09-27 DIAGNOSIS — R509 Fever, unspecified: Secondary | ICD-10-CM | POA: Diagnosis not present

## 2016-10-06 MED FILL — XIIDRA 5% EYE DROPS: 5 | 30 days supply | Qty: 60 | Fill #1

## 2016-10-07 MED FILL — DEXILANT DR 60 MG CAPSULE: 60 | 30 days supply | Qty: 30 | Fill #0

## 2016-10-12 DIAGNOSIS — Z800 Family history of malignant neoplasm of digestive organs: Secondary | ICD-10-CM | POA: Diagnosis not present

## 2016-10-21 MED FILL — miSOPROStol 200 MCG TABS: 200 | 30 days supply | Qty: 240 | Fill #0

## 2016-11-16 DIAGNOSIS — K5900 Constipation, unspecified: Secondary | ICD-10-CM | POA: Diagnosis not present

## 2016-11-16 DIAGNOSIS — R198 Other specified symptoms and signs involving the digestive system and abdomen: Secondary | ICD-10-CM | POA: Diagnosis not present

## 2016-11-30 MED FILL — MOVANTIK 12.5 MG TABLET: 12.5 | 30 days supply | Qty: 30 | Fill #0

## 2016-12-01 ENCOUNTER — Other Ambulatory Visit: Payer: Self-pay | Admitting: Internal Medicine

## 2016-12-01 DIAGNOSIS — Z139 Encounter for screening, unspecified: Secondary | ICD-10-CM

## 2016-12-07 DIAGNOSIS — K5909 Other constipation: Secondary | ICD-10-CM | POA: Diagnosis not present

## 2016-12-07 DIAGNOSIS — Z01411 Encounter for gynecological examination (general) (routine) with abnormal findings: Secondary | ICD-10-CM | POA: Diagnosis not present

## 2016-12-07 DIAGNOSIS — Z6830 Body mass index (BMI) 30.0-30.9, adult: Secondary | ICD-10-CM | POA: Diagnosis not present

## 2016-12-07 MED FILL — RELISTOR 150 MG TABLET: 150 | 30 days supply | Qty: 60 | Fill #0

## 2016-12-15 DIAGNOSIS — J209 Acute bronchitis, unspecified: Secondary | ICD-10-CM | POA: Diagnosis not present

## 2016-12-16 ENCOUNTER — Encounter: Payer: Self-pay | Admitting: Physical Therapy

## 2016-12-16 ENCOUNTER — Other Ambulatory Visit: Payer: Self-pay

## 2016-12-16 ENCOUNTER — Ambulatory Visit: Payer: 59 | Attending: Gastroenterology | Admitting: Physical Therapy

## 2016-12-16 DIAGNOSIS — R29898 Other symptoms and signs involving the musculoskeletal system: Secondary | ICD-10-CM | POA: Diagnosis not present

## 2016-12-16 DIAGNOSIS — M791 Myalgia, unspecified site: Secondary | ICD-10-CM | POA: Diagnosis not present

## 2016-12-16 DIAGNOSIS — R278 Other lack of coordination: Secondary | ICD-10-CM | POA: Diagnosis not present

## 2016-12-16 NOTE — Therapy (Signed)
[place] MAIN [facility] SERVICES [address] [place], [place], [address] Phone: [phone]   Fax:  [phone]  Physical Therapy Evaluation  Patient Details  Name: Karen [name] MRN: [id number] Date of Birth: 09-Jan-1970 Referring Provider: Jacquiline Doe, MD    Encounter Date: 12/16/2016  PT End of Session - 12/16/16 2254    Visit Number  1    Number of Visits  12    Date for PT Re-Evaluation  03/10/17    PT Start Time  0810    PT Stop Time  0911    PT Time Calculation (min)  61 min    Activity Tolerance  Patient tolerated treatment well;No increased pain    Behavior During Therapy  WFL for tasks assessed/performed       Past Medical History:  Diagnosis Date  . Adenomyosis   . Arthritis 2013   L knee gets steroid injections   . DVT of lower extremity (deep venous thrombosis) ([place])   . Dysmenorrhea   . Endometriosis   . Fibroids   . GERD (gastroesophageal reflux disease)   . Kidney stones   . Menorrhagia   . Migraine   . Sebaceous cyst of breast, right lower inner quadrant 10/25/2012   Excised 11/21/12   . Syncope, non cardiac     Past Surgical History:  Procedure Laterality Date  . ARTHROSCOPIC HAGLUNDS REPAIR    . BREAST CYST EXCISION Right 11/21/2012   Procedure: CYST EXCISION BREAST;  Surgeon: Haywood Lasso, MD;  Location: [place];  Service: General;  Laterality: Right;  . CESAREAN SECTION    . CHOLECYSTECTOMY    . COLONOSCOPY  05/02/2011   Procedure: COLONOSCOPY;  Surgeon: Winfield Cunas., MD;  Location: [facility];  Service: Endoscopy;  Laterality: N/A;  . colonoscopy  11/04/2014  . ESOPHAGEAL MANOMETRY N/A 11/04/2015   Procedure: ESOPHAGEAL MANOMETRY (EM);  Surgeon: Laurence Spates, MD;  Location: [facility];  Service: Endoscopy;  Laterality: N/A;  . ESOPHAGOGASTRODUODENOSCOPY  05/02/2011   Procedure: ESOPHAGOGASTRODUODENOSCOPY (EGD);  Surgeon: Winfield Cunas., MD;  Location: [facility];  Service: Endoscopy;  Laterality: N/A;  . ESOPHAGOGASTRODUODENOSCOPY N/A 09/01/2014   Procedure: ESOPHAGOGASTRODUODENOSCOPY (EGD);  Surgeon: Laurence Spates, MD;  Location: [facility];  Service: Endoscopy;  Laterality: N/A;  . KNEE SURGERY    . LAPAROSCOPY    . laproscopic abdominal    . [place] IMPEDANCE STUDY N/A 11/04/2015   Procedure: [place] IMPEDANCE STUDY;  Surgeon: Laurence Spates, MD;  Location: [facility];  Service: Endoscopy;  Laterality: N/A;  . VAGINAL HYSTERECTOMY      There were no vitals filed for this visit.   Subjective Assessment - 12/16/16 0834    Subjective  1) bowel movements: Pt has had nausea and vomitting starting 2 years ago when she was taken off of a GERD medication. Pt also had noticed when she was  eating certain foods that caused abdominal pain. Pt had a colonscopy which had negative findings. Pt has a family  Hx of Colon CA with both parents.  Hx of GERD and hiatal hernia. Pt has worked with nutritionist but they were not able to provide a diet because of her unknown Dx. Pt has a Dx with IBS -both constipation/diarrhea and abdominal pain of unknown origin. Pt had really bad diarrhea 4 days ago and then she has not had a bowel movements since that day. Her stools are always soft even after skipping days.  Sometimes she has  to strain. This pattern limits her from going to the store, dinner dates.  Pt works from home.  Daily fluid intake: 5 L of water, 2-3 x glass. Denied fecal leakage.  Pt takes in fiber.  Pt's abdominal cramp eases with bowel movements.   3) CLBP at L lower back when she is constipation. This LBP gets relieved with bowel movements.   Four years ago, pt lost her mother to CA after being her primary caregiver. Pt reports she multitasking when eating.       4) Urinary Sx: Hx of IC for 10-15 years. Pt has frequency of urianry 2x/ every 2 hours. Urinary leakage occurs with coughing.      Pertinent History  C-section, laproscopy 2/2 endometriosis, vaginal  hysterectomy,  a family  Hx of Colon CA with both parents.  Hx of GERD and hiatal hernia.   Patient Stated Goals  being active with spending time with family,. traveling, [place] [employer]          [facility] PT Assessment - 12/16/16 0833      Assessment   Medical Diagnosis  abnormal defecation     Referring Provider  Jacquiline Doe, MD       Precautions   Precautions  None      Restrictions   Weight Bearing Restrictions  No      Balance Screen   Has the patient fallen in the past 6 months  Yes    How many times?  -- no injuries      AROM   Overall AROM Comments  restricted rotation and sidebend, report of L low back and R abdomen       Palpation   Palpation comment  abdominal scar restricted , chest breathing, limited diaphragmatic excursion, abdominal overuse with cue for bowel movment and pelvic floor contraction             Objective measurements completed on examination: See above findings.    Pelvic Floor Special Questions - 12/16/16 2253    External Perineal Exam  tenderness and tensions at anterior triangle B        [facility] Adult PT Treatment/Exercise - 12/16/16 2253      Neuro Re-ed    Neuro Re-ed Details   see pt instructions              PT Education - 12/16/16 2253    Education provided  Yes    Education Details  POC, anatomy. physiology, HEP, goals    Person(s) Educated  Patient    Methods  Explanation;Demonstration;Tactile cues;Verbal cues;Handout    Comprehension  Verbalized understanding;Returned demonstration          PT Long Term Goals - 12/16/16 2248      PT LONG TERM GOAL #1   Title  Pt will decrease her ODI score from 32% to  27% in order to return to ADLs    Time  8    Period  Weeks    Status  New    Target Date  02/10/17      PT LONG TERM GOAL #2   Title  Pt will decrease her NIH-CPSI score from 81% in order to resotre pelvic floor function    Time  12    Period  Weeks    Status  New    Target Date  03/10/17      PT LONG TERM  GOAL #3   Title  Pt will decrease her score from 30% to < 25% in  order to have improved bowel frequency     Time  12    Period  Weeks    Status  New    Target Date  03/10/17      PT LONG TERM GOAL #4   Title  Pt will report better formed stools Type 4-5 across 75% of the time in order to be to able to go to social events    Time  12    Period  Weeks    Status  New    Target Date  03/10/17      PT LONG TERM GOAL #5   Title  Pt will de demo decreased abdominal scar restrictions in order to progress to deep core coordination and strengthening    Time  2    Period  Weeks    Status  New    Target Date  12/30/16      Additional Long Term Goals   Additional Long Term Goals  Yes      PT LONG TERM GOAL #6   Title  Pt will report urinary frequency from 2x/ every 2 hours to 1x every 2 hours in order to travel with family     Time  10    Period  Weeks    Status  New    Target Date  02/24/17      PT LONG TERM GOAL #7   Title  Pt will report not multitasking when eating her meals in order to optimize parasympathetic nervous system for proper digestion and GI health    Time  2    Period  Weeks    Status  New    Target Date  12/30/16             Plan - 12/16/16 2254    Clinical Impression Statement  Pt is 46 yo female who reports irregular bowel frequency and stool consistency, abdominal and low back pain associated with constipation, SUI, and frequent urination. Pt's clinical presentations include abdominal scar restrictions, dyscoordination of pelvic floor, limited spinal mobility, poor body mechanics and limited education on GI health. Pt also has biopsychosocial factors that may impact her digestive system. Pt expressed she experienced stress from the passing of her mother whom she was the caregiver for a few years prior to onset of her long standing Sx. Although pt is taking in water and fiber, pt also reported that she is multitasking when she eats her meal. Pt will benefit from  a biopsychosocial approach to activate the parasympathetic nervous system for optimal GI function . Pt would benefit from manual Tx to address scar restrictions to the abdominalpelvic area and pelvic floor tightness in order to improve mobility and motility. If pt's Sx do not improve through musculoskeletal interventions, plan to refer pt back for further medial work up to MD given her family Hx of colon Hx.  Following today's Tx, pt demo'd IND with HEP to increase spinal mobility and voiced agreement to try not to multitask when eating her meals.     History and Personal Factors relevant to plan of care:  family Hx of Colon CA, multiple surgeries with associated scar adhesions ( abdomen and vaginal),     Clinical Presentation  Evolving    Clinical Decision Making  Moderate       Patient will benefit from skilled therapeutic intervention in order to improve the following deficits and impairments:  Improper body mechanics, Decreased range of motion, Decreased activity tolerance, Decreased safety awareness, Decreased  strength, Hypermobility, Increased fascial restricitons, Postural dysfunction, Pain, Decreased endurance, Decreased balance, Increased muscle spasms, Decreased mobility, Decreased coordination  Visit Diagnosis: Myalgia  Other lack of coordination  Other symptoms and signs involving the musculoskeletal system     Problem List Patient Active Problem List   Diagnosis Date Noted  . Chest pain 12/13/2012  . Dyspnea 12/13/2012    Jerl Mina ,PT, DPT, E-RYT  12/16/2016, 11:03 PM  [place] MAIN [facility] SERVICES [address] [place], [place], [address] Phone: [phone]   Fax:  [phone]  Name: Karen [name] MRN: [id number] Date of Birth: 1970/02/26

## 2016-12-16 NOTE — Patient Instructions (Signed)
Breathing 1-2 pause, inhale,  2-1 pause exhale  , less in the chest  For relaxation   ____  avoid multitasking when eating and allow for time before and after to calm    ____  Increase spinal movement at every 30-45 min while working   6 directions of the spine 5 reps  Mini squat 5 reps  Arms swings,  side bend

## 2016-12-20 DIAGNOSIS — J441 Chronic obstructive pulmonary disease with (acute) exacerbation: Secondary | ICD-10-CM | POA: Diagnosis not present

## 2016-12-20 DIAGNOSIS — K582 Mixed irritable bowel syndrome: Secondary | ICD-10-CM | POA: Diagnosis not present

## 2016-12-20 MED FILL — AZITHROMYCIN 250 MG TABLET: 250 | 5 days supply | Qty: 6 | Fill #0

## 2016-12-21 ENCOUNTER — Ambulatory Visit: Payer: 59 | Admitting: Physical Therapy

## 2016-12-21 DIAGNOSIS — R29898 Other symptoms and signs involving the musculoskeletal system: Secondary | ICD-10-CM

## 2016-12-21 DIAGNOSIS — M791 Myalgia, unspecified site: Secondary | ICD-10-CM

## 2016-12-21 DIAGNOSIS — R278 Other lack of coordination: Secondary | ICD-10-CM

## 2016-12-21 NOTE — Therapy (Signed)
[place] MAIN [facility] SERVICES [address] [place], [place], [address] Phone: [phone]   Fax:  [phone]  Physical Therapy Treatment  Patient Details  Name: Karen [name] MRN: [id number] Date of Birth: Mar 31, 1970 Referring Provider: Jacquiline Doe, MD    Encounter Date: 12/21/2016  PT End of Session - 12/21/16 0923    Visit Number  2    Number of Visits  12    Date for PT Re-Evaluation  03/10/17    PT Start Time  0918    Activity Tolerance  Patient tolerated treatment well;No increased pain    Behavior During Therapy  WFL for tasks assessed/performed       Past Medical History:  Diagnosis Date  . Adenomyosis   . Arthritis 2013   L knee gets steroid injections   . DVT of lower extremity (deep venous thrombosis) ([place])   . Dysmenorrhea   . Endometriosis   . Fibroids   . GERD (gastroesophageal reflux disease)   . Kidney stones   . Menorrhagia   . Migraine   . Sebaceous cyst of breast, right lower inner quadrant 10/25/2012   Excised 11/21/12   . Syncope, non cardiac     Past Surgical History:  Procedure Laterality Date  . ARTHROSCOPIC HAGLUNDS REPAIR    . BREAST CYST EXCISION Right 11/21/2012   Procedure: CYST EXCISION BREAST;  Surgeon: Haywood Lasso, MD;  Location: [place];  Service: General;  Laterality: Right;  . CESAREAN SECTION    . CHOLECYSTECTOMY    . COLONOSCOPY  05/02/2011   Procedure: COLONOSCOPY;  Surgeon: Winfield Cunas., MD;  Location: [facility];  Service: Endoscopy;  Laterality: N/A;  . colonoscopy  11/04/2014  . ESOPHAGEAL MANOMETRY N/A 11/04/2015   Procedure: ESOPHAGEAL MANOMETRY (EM);  Surgeon: Laurence Spates, MD;  Location: [facility];  Service: Endoscopy;  Laterality: N/A;  . ESOPHAGOGASTRODUODENOSCOPY  05/02/2011   Procedure: ESOPHAGOGASTRODUODENOSCOPY (EGD);  Surgeon: Winfield Cunas., MD;  Location: [facility];  Service: Endoscopy;  Laterality: N/A;  .  ESOPHAGOGASTRODUODENOSCOPY N/A 09/01/2014   Procedure: ESOPHAGOGASTRODUODENOSCOPY (EGD);  Surgeon: Laurence Spates, MD;  Location: [facility];  Service: Endoscopy;  Laterality: N/A;  . KNEE SURGERY    . LAPAROSCOPY    . laproscopic abdominal    . [place] IMPEDANCE STUDY N/A 11/04/2015   Procedure: [place] IMPEDANCE STUDY;  Surgeon: Laurence Spates, MD;  Location: [facility];  Service: Endoscopy;  Laterality: N/A;  . VAGINAL HYSTERECTOMY      There were no vitals filed for this visit.  Subjective Assessment - 12/21/16 0923    Subjective  Pt reports she felt pretty good after last session. Pt is noticing more leakage with coughing.     Pertinent History  C-section, laproscopy 2/2 endometriosis, vaginal hysterectomy,     Patient Stated Goals  being active with spending time with family,. traveling, Zumba class          [facility] PT Assessment - 12/21/16 0926      Posture/Postural Control   Posture Comments  lumbopelvic perturbation with leg movements      AROM   Overall AROM Comments  no LBP with rtrunk rotation and sidebend       Palpation   SI assessment   L ASIS higher than R     Palpation comment  L more restricted than R on scar                   [facility]  Adult PT Treatment/Exercise - 12/21/16 0926      Neuro Re-ed    Neuro Re-ed Details   see pt instructions       Manual Therapy   Manual therapy comments  AP mob at L SIJ to faciliate equal alignment  abdominal scar fascial releases              PT Education - 12/21/16 0958    Education provided  Yes    Education Details  HEP    Person(s) Educated  Patient    Methods  Explanation;Demonstration;Tactile cues;Verbal cues;Handout    Comprehension  Returned demonstration;Verbalized understanding          PT Long Term Goals - 12/16/16 2248      PT LONG TERM GOAL #1   Title  Pt will decrease her ODI score from 32% to  27% in order to return to ADLs    Time  8    Period  Weeks    Status  New    Target Date   02/10/17      PT LONG TERM GOAL #2   Title  Pt will decrease her NIH-CPSI score from 81% in order to resotre pelvic floor function    Time  12    Period  Weeks    Status  New    Target Date  03/10/17      PT LONG TERM GOAL #3   Title  Pt will decrease her score from 30% to < 25% in order to have improved bowel frequency     Time  12    Period  Weeks    Status  New    Target Date  03/10/17      PT LONG TERM GOAL #4   Title  Pt will report better formed stools Type 4-5 across 75% of the time in order to be to able to go to social events    Time  12    Period  Weeks    Status  New    Target Date  03/10/17      PT LONG TERM GOAL #5   Title  Pt will de demo decreased abdominal scar restrictions in order to progress to deep core coordination and strengthening    Time  2    Period  Weeks    Status  New    Target Date  12/30/16      Additional Long Term Goals   Additional Long Term Goals  Yes      PT LONG TERM GOAL #6   Title  Pt will report urinary frequency from 2x/ every 2 hours to 1x every 2 hours in order to travel with family     Time  10    Period  Weeks    Status  New    Target Date  02/24/17      PT LONG TERM GOAL #7   Title  Pt will report not multitasking when eating her meals in order to optimize parasympathetic nervous system for proper digestion and GI health    Time  2    Period  Weeks    Status  New    Target Date  12/30/16            Plan - 12/21/16 9563    Clinical Impression Statement  Pt is progressing well with decreased low back pain after last session and presented today w ithout pain with spinal movements. Pt also showed decreased abdominal scar restrictions  post Tx. Initated deep core strengthening and quick pelvic floor squeezes. pt practiced applying quick squeeze with coughing in supine and sitting. Pt was cued to and educated about not hyperextending her knees. Provided education about workstation modification. Pt tolerated today's Tx  without complaints. Pt continues to benefits from skilled PT.    Rehab Potential  Good    PT Frequency  2x / week    PT Duration  12 weeks    PT Treatment/Interventions  Therapeutic activities;Therapeutic exercise;Functional mobility training;Neuromuscular re-education;Manual techniques;Patient/family education;Moist Heat;Balance training;Stair training;Energy conservation;Taping    Consulted and Agree with Plan of Care  Patient       Patient will benefit from skilled therapeutic intervention in order to improve the following deficits and impairments:  Improper body mechanics, Decreased range of motion, Decreased activity tolerance, Decreased safety awareness, Decreased strength, Hypermobility, Increased fascial restricitons, Postural dysfunction, Pain, Decreased endurance, Decreased balance, Increased muscle spasms, Decreased mobility, Decreased coordination  Visit Diagnosis: Other lack of coordination  Myalgia  Other symptoms and signs involving the musculoskeletal system     Problem List Patient Active Problem List   Diagnosis Date Noted  . Chest pain 12/13/2012  . Dyspnea 12/13/2012    Jerl Mina ,PT, DPT, E-RYT  12/21/2016, 10:03 AM  [place] MAIN [facility] SERVICES [address] [place], [place], [address] Phone: [phone]   Fax:  [phone]  Name: TONEE SILVERSTEIN MRN: [id number] Date of Birth: 29-Jun-1970

## 2016-12-21 NOTE — Patient Instructions (Addendum)
Six directions of spine:   arm swings     side bend L and R      minisquat for forward bend     chest lifts     _________ 2x day  Deep core level 1 With quick pelvic floor squeeze  10 reps    Deep core level 2   6 min  (handout)    __________  Quick squeeze of pelvic floor with coughing    __________  Pyramid massage on belly   3 strokes x 3 reps  X 3 sets  At night  Gentle pressure , flat fingers   __________  Change work station by raising top of monitor up higher to eye level

## 2016-12-28 ENCOUNTER — Ambulatory Visit: Payer: 59 | Admitting: Physical Therapy

## 2016-12-28 DIAGNOSIS — R278 Other lack of coordination: Secondary | ICD-10-CM | POA: Diagnosis not present

## 2016-12-28 DIAGNOSIS — M791 Myalgia, unspecified site: Secondary | ICD-10-CM | POA: Diagnosis not present

## 2016-12-28 DIAGNOSIS — R29898 Other symptoms and signs involving the musculoskeletal system: Secondary | ICD-10-CM

## 2016-12-28 NOTE — Therapy (Addendum)
[place] MAIN [facility] SERVICES [address] [place], [place], [address] Phone: [phone]   Fax:  [phone]  Physical Therapy Treatment  Patient Details  Name: Karen [name] MRN: [id number] Date of Birth: August 30, 1970 Referring Provider: Jacquiline Doe, MD    Encounter Date: 12/28/2016  PT End of Session - 12/28/16 0904    Visit Number  3    Number of Visits  12    Date for PT Re-Evaluation  03/10/17    PT Start Time  0810    PT Stop Time  0904    PT Time Calculation (min)  54 min    Activity Tolerance  Patient tolerated treatment well;No increased pain    Behavior During Therapy  WFL for tasks assessed/performed       Past Medical History:  Diagnosis Date  . Adenomyosis   . Arthritis 2013   L knee gets steroid injections   . DVT of lower extremity (deep venous thrombosis) ([place])   . Dysmenorrhea   . Endometriosis   . Fibroids   . GERD (gastroesophageal reflux disease)   . Kidney stones   . Menorrhagia   . Migraine   . Sebaceous cyst of breast, right lower inner quadrant 10/25/2012   Excised 11/21/12   . Syncope, non cardiac     Past Surgical History:  Procedure Laterality Date  . ARTHROSCOPIC HAGLUNDS REPAIR    . BREAST CYST EXCISION Right 11/21/2012   Procedure: CYST EXCISION BREAST;  Surgeon: Haywood Lasso, MD;  Location: [place];  Service: General;  Laterality: Right;  . CESAREAN SECTION    . CHOLECYSTECTOMY    . COLONOSCOPY  05/02/2011   Procedure: COLONOSCOPY;  Surgeon: Winfield Cunas., MD;  Location: [facility];  Service: Endoscopy;  Laterality: N/A;  . colonoscopy  11/04/2014  . ESOPHAGEAL MANOMETRY N/A 11/04/2015   Procedure: ESOPHAGEAL MANOMETRY (EM);  Surgeon: Laurence Spates, MD;  Location: [facility];  Service: Endoscopy;  Laterality: N/A;  . ESOPHAGOGASTRODUODENOSCOPY  05/02/2011   Procedure: ESOPHAGOGASTRODUODENOSCOPY (EGD);  Surgeon: Winfield Cunas., MD;  Location: [facility];  Service: Endoscopy;  Laterality: N/A;  . ESOPHAGOGASTRODUODENOSCOPY N/A 09/01/2014   Procedure: ESOPHAGOGASTRODUODENOSCOPY (EGD);  Surgeon: Laurence Spates, MD;  Location: [facility];  Service: Endoscopy;  Laterality: N/A;  . KNEE SURGERY    . LAPAROSCOPY    . laproscopic abdominal    . [place] IMPEDANCE STUDY N/A 11/04/2015   Procedure: [place] IMPEDANCE STUDY;  Surgeon: Laurence Spates, MD;  Location: [facility];  Service: Endoscopy;  Laterality: N/A;  . VAGINAL HYSTERECTOMY      There were no vitals filed for this visit.  Subjective Assessment - 12/28/16 0812    Subjective  Pt reported her leakage with coughing has improved by 50%. Pt also noticed her fecal/urinary urgency has gotten much better because she has started to not multitask when she is eating. Pt also had no low back pain on her out of town trip to see family when riding in the car. Pt noticed when she practiced the relaxation techniques and talked herself to calm down which helped her acid reflex symptoms to resolve after eating differents on her trip.  Pt has been having daily bowel movements. The current medication is working well for her because she only had one episode of diarrhea instead of severe diarrhea.      Pertinent History  C-section, laproscopy 2/2 endometriosis, vaginal hysterectomy,     Patient Stated Goals  being  active with spending time with family,. traveling, Zumba class          [facility] PT Assessment - 12/28/16 0001      Palpation   SI assessment   L ASIS higher than R. Leg length equal to medial malleoli 90 cm     Palpation comment  R SIJ limited in hip FADDER and AP                   [facility] Adult PT Treatment/Exercise - 12/28/16 4034      Neuro Re-ed    Neuro Re-ed Details   see pt instructions       Moist Heat Therapy   Number Minutes Moist Heat  5 Minutes    Moist Heat Location  -- buttocks       Manual Therapy   Manual therapy comments  AP with FADDER  mob at R SIJ Grade III ,  inferior glide of sacrum ar R SIJ, STM at piriformis in prone               PT Education - 12/28/16 0900    Education provided  Yes    Education Details  HEP     Person(s) Educated  Patient    Methods  Explanation    Comprehension  Verbalized understanding;Returned demonstration;Verbal cues required          PT Long Term Goals - 12/16/16 2248      PT LONG TERM GOAL #1   Title  Pt will decrease her ODI score from 32% to  27% in order to return to ADLs    Time  8    Period  Weeks    Status  New    Target Date  02/10/17      PT LONG TERM GOAL #2   Title  Pt will decrease her NIH-CPSI score from 81% in order to resotre pelvic floor function    Time  12    Period  Weeks    Status  New    Target Date  03/10/17      PT LONG TERM GOAL #3   Title  Pt will decrease her score from 30% to < 25% in order to have improved bowel frequency     Time  12    Period  Weeks    Status  New    Target Date  03/10/17      PT LONG TERM GOAL #4   Title  Pt will report better formed stools Type 4-5 across 75% of the time in order to be to able to go to social events    Time  12    Period  Weeks    Status  New    Target Date  03/10/17      PT LONG TERM GOAL #5   Title  Pt will de demo decreased abdominal scar restrictions in order to progress to deep core coordination and strengthening    Time  2    Period  Weeks    Status  New    Target Date  12/30/16      Additional Long Term Goals   Additional Long Term Goals  Yes      PT LONG TERM GOAL #6   Title  Pt will report urinary frequency from 2x/ every 2 hours to 1x every 2 hours in order to travel with family     Time  10    Period  Weeks  Status  New    Target Date  02/24/17      PT LONG TERM GOAL #7   Title  Pt will report not multitasking when eating her meals in order to optimize parasympathetic nervous system for proper digestion and GI health    Time  2    Period  Weeks    Status  New    Target Date  12/30/16             Plan - 12/28/16 0905    Clinical Impression Statement  Pt is progressing well with report of 50% improvement with urinary / fecal urgency and being able to travel in a car out of state without LBP. Pt's pelvic obliquities were addressed today with manual Tx. Post Tx, her R SIJ mobility increased and her iliac crest ASIS appeared more equal. Plan to assess pelvic floor at next session to make further porgress with her goals. Pt continues to benefit from skilled PT.       Rehab Potential  Good    PT Frequency  2x / week    PT Duration  12 weeks    PT Treatment/Interventions  Therapeutic activities;Therapeutic exercise;Functional mobility training;Neuromuscular re-education;Manual techniques;Patient/family education;Moist Heat;Balance training;Stair training;Energy conservation;Taping    Consulted and Agree with Plan of Care  Patient       Patient will benefit from skilled therapeutic intervention in order to improve the following deficits and impairments:  Improper body mechanics, Decreased range of motion, Decreased activity tolerance, Decreased safety awareness, Decreased strength, Hypermobility, Increased fascial restricitons, Postural dysfunction, Pain, Decreased endurance, Decreased balance, Increased muscle spasms, Decreased mobility, Decreased coordination  Visit Diagnosis: Other lack of coordination  Myalgia  Other symptoms and signs involving the musculoskeletal system     Problem List Patient Active Problem List   Diagnosis Date Noted  . Chest pain 12/13/2012  . Dyspnea 12/13/2012    Jerl Mina ,PT, DPT, E-RYT  12/28/2016, 9:11 AM  [place] MAIN [facility] SERVICES [address] [place], [place], [address] Phone: [phone]   Fax:  [phone]  Name: Karen [name] MRN: [id number] Date of Birth: [birth year]

## 2016-12-28 NOTE — Patient Instructions (Addendum)
Speak with doctor about your skin care care product that you have been using which contains herbs to understand if there are any contraindications to your medications and medical history.     A. To release tightness in R sacroiliac joint and to correct alignment     Alternate every 5 reps   ( 5 sets of frog for 5, prone heel press for 5)  = 10 sets   Frog stretch: laying on belly with pillow under hips, knees bent, inhale do nothing, exhale let ankles fall apart     Prone Heel Press for strengthening sacro-iliac joints  1. Lie on your belly. If you have an arch in your low back or it feels umcomfortable, place a pillow under your low belly/hips to make sure your low back feel comfortable.   2. Place our forehead on top of your palms.      Widen your knees apart for starting position.   3. Inhale, feel belly and low back expand  4. Exhale, feel belly hug in, press heel together and count aloud for 5 sec. Then relax the heel squeezing.  Perform 10 reps of 5 sec holds. 2 sets/ day.    If you feel entire buttock tighten too much or feel low back pain, apply 50% less effort. As you press your heel together, you will feel as if your pubic bone (front of your pelvis) and sacrum (back of your pelvis) gentle move towards each other or your low abdominal muscles hug in more.           B. To release tightness in R sacroiliac joint and to correct alignment  Child pose rocking  10 reps

## 2016-12-29 ENCOUNTER — Ambulatory Visit: Payer: 59

## 2017-01-04 ENCOUNTER — Ambulatory Visit: Payer: 59 | Attending: Gastroenterology | Admitting: Physical Therapy

## 2017-01-04 DIAGNOSIS — R278 Other lack of coordination: Secondary | ICD-10-CM | POA: Insufficient documentation

## 2017-01-04 DIAGNOSIS — M791 Myalgia, unspecified site: Secondary | ICD-10-CM | POA: Insufficient documentation

## 2017-01-04 DIAGNOSIS — R29898 Other symptoms and signs involving the musculoskeletal system: Secondary | ICD-10-CM | POA: Diagnosis not present

## 2017-01-04 MED FILL — XIIDRA 5% EYE DROPS: 5 | 30 days supply | Qty: 60 | Fill #2

## 2017-01-04 MED FILL — RELISTOR 150 MG TABLET: 150 | 30 days supply | Qty: 60 | Fill #1

## 2017-01-04 NOTE — Patient Instructions (Signed)
Happy baby pose   With towel     _______   Body scan ( audio and research articles emailed)    _______  Propping feet up for 10-15 min after eating meal and practice body scan

## 2017-01-04 NOTE — Therapy (Signed)
[place] MAIN [facility] SERVICES [address] [place], [place], [address] Phone: [phone]   Fax:  [phone]  Physical Therapy Treatment  Patient Details  Name: Karen [name] MRN: [id number] Date of Birth: 1970-09-25 Referring Provider: Jacquiline Doe, MD    Encounter Date: 01/04/2017  PT End of Session - 01/04/17 0910    Visit Number  4    Number of Visits  12    Date for PT Re-Evaluation  03/10/17    PT Start Time  0910    PT Stop Time  1004    PT Time Calculation (min)  54 min    Activity Tolerance  Patient tolerated treatment well;No increased pain    Behavior During Therapy  WFL for tasks assessed/performed       Past Medical History:  Diagnosis Date  . Adenomyosis   . Arthritis 2013   L knee gets steroid injections   . DVT of lower extremity (deep venous thrombosis) ([place])   . Dysmenorrhea   . Endometriosis   . Fibroids   . GERD (gastroesophageal reflux disease)   . Kidney stones   . Menorrhagia   . Migraine   . Sebaceous cyst of breast, right lower inner quadrant 10/25/2012   Excised 11/21/12   . Syncope, non cardiac     Past Surgical History:  Procedure Laterality Date  . ARTHROSCOPIC HAGLUNDS REPAIR    . BREAST CYST EXCISION Right 11/21/2012   Procedure: CYST EXCISION BREAST;  Surgeon: Haywood Lasso, MD;  Location: [place];  Service: General;  Laterality: Right;  . CESAREAN SECTION    . CHOLECYSTECTOMY    . COLONOSCOPY  05/02/2011   Procedure: COLONOSCOPY;  Surgeon: Winfield Cunas., MD;  Location: [facility];  Service: Endoscopy;  Laterality: N/A;  . colonoscopy  11/04/2014  . ESOPHAGEAL MANOMETRY N/A 11/04/2015   Procedure: ESOPHAGEAL MANOMETRY (EM);  Surgeon: Laurence Spates, MD;  Location: [facility];  Service: Endoscopy;  Laterality: N/A;  . ESOPHAGOGASTRODUODENOSCOPY  05/02/2011   Procedure: ESOPHAGOGASTRODUODENOSCOPY (EGD);  Surgeon: Winfield Cunas., MD;  Location: [facility];  Service: Endoscopy;  Laterality: N/A;  . ESOPHAGOGASTRODUODENOSCOPY N/A 09/01/2014   Procedure: ESOPHAGOGASTRODUODENOSCOPY (EGD);  Surgeon: Laurence Spates, MD;  Location: [facility];  Service: Endoscopy;  Laterality: N/A;  . KNEE SURGERY    . LAPAROSCOPY    . laproscopic abdominal    . [place] IMPEDANCE STUDY N/A 11/04/2015   Procedure: [place] IMPEDANCE STUDY;  Surgeon: Laurence Spates, MD;  Location: [facility];  Service: Endoscopy;  Laterality: N/A;  . VAGINAL HYSTERECTOMY      There were no vitals filed for this visit.  Subjective Assessment - 01/04/17 0911    Subjective  Pt had less leakage with coughing while squeezing pelvic floor. Pt felt sore in the sacral area after last session but the soreness resolved.  Pt had only one epsiode of diarrhea the past  week!     Pertinent History  C-section, laproscopy 2/2 endometriosis, vaginal hysterectomy,     Patient Stated Goals  being active with spending time with family,. traveling, Zumba class          [facility] PT Assessment - 01/04/17 1010      Palpation   SI assessment   ASIS levelled     Palpation comment  SIJ with normal mobility                Pelvic Floor Special Questions - 01/04/17 1010  External Perineal Exam  tenderness and tensions at anterior triangle B, obt int B  ( decreased post Tx)          [facility] Adult PT Treatment/Exercise - 01/04/17 1100      Neuro Re-ed    Neuro Re-ed Details   see pt instructions       Moist Heat Therapy   Number Minutes Moist Heat  5 Minutes    Moist Heat Location  -- perineal, wrapped in pillow case and towel      Manual Therapy   Manual therapy comments  through clothing: B ont int/ deep transverse perineal B STM/ MWM              PT Education - 01/04/17 1059    Education Details  HEP    Person(s) Educated  Patient    Methods  Explanation;Demonstration;Tactile cues;Verbal cues;Handout    Comprehension  Returned demonstration;Verbalized understanding           PT Long Term Goals - 01/04/17 0914      PT LONG TERM GOAL #1   Title  Pt will decrease her ODI score from 32% to  27% in order to return to ADLs    Time  8    Period  Weeks    Status  On-going      PT LONG TERM GOAL #2   Title  Pt will decrease her NIH-CPSI score from 81% in order to resotre pelvic floor function    Time  12    Period  Weeks    Status  On-going      PT LONG TERM GOAL #3   Title  Pt will decrease her COREFO score from 30% to < 25% in order to have improved bowel frequency     Time  12    Period  Weeks    Status  On-going      PT LONG TERM GOAL #4   Title  Pt will report better formed stools with consistency type (Bristol Stool Scale)  Type 4-5 across 75% of the time in order to be to able to go to social events    Time  12    Period  Weeks    Status  Achieved      PT LONG TERM GOAL #5   Title  Pt will demo decreased abdominal scar restrictions in order to progress to deep core coordination and strengthening    Time  2    Period  Weeks    Status  Achieved      PT LONG TERM GOAL #6   Title  Pt will report urinary frequency from 2x/ every 2 hours to 1x every 2 hours in order to travel with family     Time  10    Period  Weeks    Status  New      PT LONG TERM GOAL #7   Title  Pt will report not multitasking when eating her meals in order to optimize parasympathetic nervous system for proper digestion and GI health    Time  2    Period  Weeks    Status  Achieved            Plan - 01/04/17 1106    Clinical Impression Statement  Pt is progressing well with compliance to not multitasking while eating her meals. Pt has only had 1 diarrhea episode across the past week. Pt showed good carry over with pelvic alignment. Addressed pt's anterior pelvic  floor tightness/ tenderness with external manual Tx which decreased following Tx. Pt was initiated to mindfulness training for more downregulation of her nn system. Pt reported feeling more relaxed and  plans to utilize this technique after eating breakfast. Added one pelvic floor stretch to HEP. Anticipate pt's urinary frequency will improve with relaxation training.  Withhold kegel strengtehning exercises due to overactivity of pelvic floor. Pt has learned quick pelvic floor squeezes with coughing which has helped decrease SUI. Pt continues to benefit from skilled PT.     Rehab Potential  Good    PT Frequency  2x / week    PT Duration  12 weeks    PT Treatment/Interventions  Therapeutic activities;Therapeutic exercise;Functional mobility training;Neuromuscular re-education;Manual techniques;Patient/family education;Moist Heat;Balance training;Stair training;Energy conservation;Taping    Consulted and Agree with Plan of Care  Patient       Patient will benefit from skilled therapeutic intervention in order to improve the following deficits and impairments:  Improper body mechanics, Decreased range of motion, Decreased activity tolerance, Decreased safety awareness, Decreased strength, Hypermobility, Increased fascial restricitons, Postural dysfunction, Pain, Decreased endurance, Decreased balance, Increased muscle spasms, Decreased mobility, Decreased coordination  Visit Diagnosis: Other lack of coordination  Myalgia  Other symptoms and signs involving the musculoskeletal system     Problem List Patient Active Problem List   Diagnosis Date Noted  . Chest pain 12/13/2012  . Dyspnea 12/13/2012    Jerl Mina ,PT, DPT, E-RYT  01/04/2017, 11:07 AM  [place] MAIN [facility] SERVICES [address] [place], [place], [address] Phone: [phone]   Fax:  [phone]  Name: Karen [name] MRN: [id number] Date of Birth: 1970/05/23

## 2017-01-11 ENCOUNTER — Ambulatory Visit: Payer: 59 | Admitting: Physical Therapy

## 2017-01-11 DIAGNOSIS — R278 Other lack of coordination: Secondary | ICD-10-CM

## 2017-01-11 DIAGNOSIS — R29898 Other symptoms and signs involving the musculoskeletal system: Secondary | ICD-10-CM

## 2017-01-11 DIAGNOSIS — M791 Myalgia, unspecified site: Secondary | ICD-10-CM | POA: Diagnosis not present

## 2017-01-11 NOTE — Patient Instructions (Signed)
Stretch for pelvic floor   "v heels slide away and then back toward buttocks and then rock knee to slight ,  slide heel along at 11 o clock away from buttocks   10 reps  _____  Refine breathing ( Level 1) with tempo -and count with pause   Be aware of keeping all four corners of feet contacting surface ( strap in happy baby or bed during Deep Core Level 2)

## 2017-01-11 NOTE — Therapy (Signed)
[place] MAIN [facility] SERVICES [address] [place], [place], [address] Phone: [phone]   Fax:  [phone]  Physical Therapy Treatment  Patient Details  Name: Karen [name] MRN: [id number] Date of Birth: 04-05-1970 Referring Provider: Jacquiline Doe, MD    Encounter Date: 01/11/2017  PT End of Session - 01/11/17 0911    Visit Number  5    Number of Visits  12    Date for PT Re-Evaluation  03/10/17    PT Start Time  0810    PT Stop Time  0905    PT Time Calculation (min)  55 min    Activity Tolerance  Patient tolerated treatment well;No increased pain    Behavior During Therapy  WFL for tasks assessed/performed       Past Medical History:  Diagnosis Date  . Adenomyosis   . Arthritis 2013   L knee gets steroid injections   . DVT of lower extremity (deep venous thrombosis) ([place])   . Dysmenorrhea   . Endometriosis   . Fibroids   . GERD (gastroesophageal reflux disease)   . Kidney stones   . Menorrhagia   . Migraine   . Sebaceous cyst of breast, right lower inner quadrant 10/25/2012   Excised 11/21/12   . Syncope, non cardiac     Past Surgical History:  Procedure Laterality Date  . ARTHROSCOPIC HAGLUNDS REPAIR    . BREAST CYST EXCISION Right 11/21/2012   Procedure: CYST EXCISION BREAST;  Surgeon: Haywood Lasso, MD;  Location: [place];  Service: General;  Laterality: Right;  . CESAREAN SECTION    . CHOLECYSTECTOMY    . COLONOSCOPY  05/02/2011   Procedure: COLONOSCOPY;  Surgeon: Winfield Cunas., MD;  Location: [facility];  Service: Endoscopy;  Laterality: N/A;  . colonoscopy  11/04/2014  . ESOPHAGEAL MANOMETRY N/A 11/04/2015   Procedure: ESOPHAGEAL MANOMETRY (EM);  Surgeon: Laurence Spates, MD;  Location: [facility];  Service: Endoscopy;  Laterality: N/A;  . ESOPHAGOGASTRODUODENOSCOPY  05/02/2011   Procedure: ESOPHAGOGASTRODUODENOSCOPY (EGD);  Surgeon: Winfield Cunas., MD;  Location: [facility];  Service: Endoscopy;  Laterality: N/A;  . ESOPHAGOGASTRODUODENOSCOPY N/A 09/01/2014   Procedure: ESOPHAGOGASTRODUODENOSCOPY (EGD);  Surgeon: Laurence Spates, MD;  Location: [facility];  Service: Endoscopy;  Laterality: N/A;  . KNEE SURGERY    . LAPAROSCOPY    . laproscopic abdominal    . [place] IMPEDANCE STUDY N/A 11/04/2015   Procedure: [place] IMPEDANCE STUDY;  Surgeon: Laurence Spates, MD;  Location: [facility];  Service: Endoscopy;  Laterality: N/A;  . VAGINAL HYSTERECTOMY      There were no vitals filed for this visit.  Subjective Assessment - 01/11/17 0819    Subjective  Pt reports she is noticing when she needs to relax more and how it affecting her. Pt noticed she went to the bathroom alot because she had a lot going on but it was more managable it. Pt's lekaage is much better    Pertinent History  C-section, laproscopy 2/2 endometriosis, vaginal hysterectomy,     Patient Stated Goals  being active with spending time with family,. traveling, [employer] class          [facility] PT Assessment - 01/11/17 0907      Posture/Postural Control   Posture Comments  supination of feet in exercises                Pelvic Floor Special Questions - 01/11/17 4503    External Perineal  Exam  tensions at ischioanal fossa and obturator fossa L , urethrea compressae    Pelvic Floor Internal Exam  pt consent verbally after explanation of details to exam. Pt had no contraindications     Exam Type  Vaginal    Palpation  tightness 3rd layers, decreased with cue for feet co-activation, tensions 12-6 oclock at puborectalis          [facility] Adult PT Treatment/Exercise - 01/11/17 0907      Neuro Re-ed    Neuro Re-ed Details   see pt instructions       Moist Heat Therapy   Number Minutes Moist Heat  5 Minutes    Moist Heat Location  -- buttocks / perineum with towels /pillow case       Manual Therapy   Manual therapy comments  anterior attachment of puborectalis and urethra compressae, ischio  anal fossa/ obturator fossa with MWM     Internal Pelvic Floor  L 3rd layer with STM and thiele mssage at ischial spine               PT Education - 01/11/17 0911    Education provided  Yes    Education Details  HEP    Person(s) Educated  Patient    Methods  Explanation;Demonstration;Tactile cues;Verbal cues    Comprehension  Returned demonstration;Verbalized understanding          PT Long Term Goals - 01/04/17 0914      PT LONG TERM GOAL #1   Title  Pt will decrease her ODI score from 32% to  27% in order to return to ADLs    Time  8    Period  Weeks    Status  On-going      PT LONG TERM GOAL #2   Title  Pt will decrease her NIH-CPSI score from 81% in order to resotre pelvic floor function    Time  12    Period  Weeks    Status  On-going      PT LONG TERM GOAL #3   Title  Pt will decrease her COREFO score from 30% to < 25% in order to have improved bowel frequency     Time  12    Period  Weeks    Status  On-going      PT LONG TERM GOAL #4   Title  Pt will report better formed stools with consistency type (Bristol Stool Scale)  Type 4-5 across 75% of the time in order to be to able to go to social events    Time  12    Period  Weeks    Status  Achieved      PT LONG TERM GOAL #5   Title  Pt will demo decreased abdominal scar restrictions in order to progress to deep core coordination and strengthening    Time  2    Period  Weeks    Status  Achieved      PT LONG TERM GOAL #6   Title  Pt will report urinary frequency from 2x/ every 2 hours to 1x every 2 hours in order to travel with family     Time  10    Period  Weeks    Status  New      PT LONG TERM GOAL #7   Title  Pt will report not multitasking when eating her meals in order to optimize parasympathetic nervous system for proper digestion and GI health  Time  2    Period  Weeks    Status  Achieved            Plan - 01/11/17 0912    Clinical Impression Statement  Pt demo'd decreased L  puborectalis, urethral compressor mm tightness and at the areas of ischioanal /obturator fossa following Tx. Pt required cues for improved pelvic floor lengthening and co-activation of her feet in exercises to minimize overactivity of pelvic floor mm. Pt continues to report less leakage. Pt demo'd understanding to work on Soil scientist iques she learned today and to keep improving sleep quality with winddown routine. Pt continues to benefit from skilled PT.     Rehab Potential  Good    PT Frequency  2x / week    PT Duration  12 weeks    PT Treatment/Interventions  Therapeutic activities;Therapeutic exercise;Functional mobility training;Neuromuscular re-education;Manual techniques;Patient/family education;Moist Heat;Balance training;Stair training;Energy conservation;Taping    Consulted and Agree with Plan of Care  Patient       Patient will benefit from skilled therapeutic intervention in order to improve the following deficits and impairments:  Improper body mechanics, Decreased range of motion, Decreased activity tolerance, Decreased safety awareness, Decreased strength, Hypermobility, Increased fascial restricitons, Postural dysfunction, Pain, Decreased endurance, Decreased balance, Increased muscle spasms, Decreased mobility, Decreased coordination  Visit Diagnosis: Other lack of coordination  Myalgia  Other symptoms and signs involving the musculoskeletal system     Problem List Patient Active Problem List   Diagnosis Date Noted  . Chest pain 12/13/2012  . Dyspnea 12/13/2012    Jerl Mina ,PT, DPT, E-RYT  01/11/2017, 9:37 AM  [place] MAIN [facility] SERVICES [address] [place], [place], [address] Phone: [phone]   Fax:  [phone]  Name: Karen [name] MRN: [id number] Date of Birth: 12-28-1970

## 2017-01-12 DIAGNOSIS — K582 Mixed irritable bowel syndrome: Secondary | ICD-10-CM | POA: Diagnosis not present

## 2017-01-12 DIAGNOSIS — M6289 Other specified disorders of muscle: Secondary | ICD-10-CM | POA: Diagnosis not present

## 2017-01-12 DIAGNOSIS — R1084 Generalized abdominal pain: Secondary | ICD-10-CM | POA: Diagnosis not present

## 2017-01-12 DIAGNOSIS — K219 Gastro-esophageal reflux disease without esophagitis: Secondary | ICD-10-CM | POA: Diagnosis not present

## 2017-01-12 DIAGNOSIS — K5900 Constipation, unspecified: Secondary | ICD-10-CM | POA: Diagnosis not present

## 2017-01-13 ENCOUNTER — Ambulatory Visit: Payer: 59 | Admitting: Physical Therapy

## 2017-01-13 DIAGNOSIS — R29898 Other symptoms and signs involving the musculoskeletal system: Secondary | ICD-10-CM | POA: Diagnosis not present

## 2017-01-13 DIAGNOSIS — M791 Myalgia, unspecified site: Secondary | ICD-10-CM | POA: Diagnosis not present

## 2017-01-13 DIAGNOSIS — R278 Other lack of coordination: Secondary | ICD-10-CM

## 2017-01-13 NOTE — Patient Instructions (Addendum)
During work breaks:   To pelvic floor stretches and to co-activate the feet   .    Extended side angle :  Feet are hip width apart, L foot one behind like you are on ski tracks,  R knee bent over ankle but not more forward then the ankle.  Make sure 50% weight is in the front foot/leg , 50% weight is the back foot/ leg    Rest R forearm lightly on top of thigh,  L hand on L hip.  Inhale lengthen spine,   Exhale turn navel to the L then the ribcage turns, look at the other wall.  Keep maintaining  50% weight is in the front foot/leg , 50% weight is the back foot/ leg  And make sure the front knee is still pointed in the toe line of the 2nd toe.   3 breaths here.     REVERSE    : hands on chair  Feet are hip width apart, L foot one behind like you are on ski tracks,  R knee bent over ankle but not more forward then the ankle.  Make sure 50% weight is in the front foot/leg , 50% weight is the back foot/ leg    Rest R forearm lightly on top of thigh,  L hand on L hip.  Inhale lengthen spine,   Exhale turn navel to the L then the ribcage turns, look at the other wall.  Keep maintaining  50% weight is in the front foot/leg , 50% weight is the back foot/ leg  And make sure the front knee is still pointed in the toe line of the 2nd toe.   3 breaths here.

## 2017-01-14 NOTE — Therapy (Addendum)
[place] MAIN [facility] SERVICES [address] [place], [place], [address] Phone: [phone]   Fax:  [phone]  Physical Therapy Treatment  Patient Details  Name: Karen [name] MRN: [id number] Date of Birth: 05/05/1970 Referring Provider: Jacquiline Doe, MD    Encounter Date: 01/13/2017    Past Medical History:  Diagnosis Date  . Adenomyosis   . Arthritis 2013   L knee gets steroid injections   . DVT of lower extremity (deep venous thrombosis) ([place])   . Dysmenorrhea   . Endometriosis   . Fibroids   . GERD (gastroesophageal reflux disease)   . Kidney stones   . Menorrhagia   . Migraine   . Sebaceous cyst of breast, right lower inner quadrant 10/25/2012   Excised 11/21/12   . Syncope, non cardiac     Past Surgical History:  Procedure Laterality Date  . ARTHROSCOPIC HAGLUNDS REPAIR    . BREAST CYST EXCISION Right 11/21/2012   Procedure: CYST EXCISION BREAST;  Surgeon: Haywood Lasso, MD;  Location: [place];  Service: General;  Laterality: Right;  . CESAREAN SECTION    . CHOLECYSTECTOMY    . COLONOSCOPY  05/02/2011   Procedure: COLONOSCOPY;  Surgeon: Winfield Cunas., MD;  Location: [facility];  Service: Endoscopy;  Laterality: N/A;  . colonoscopy  11/04/2014  . ESOPHAGEAL MANOMETRY N/A 11/04/2015   Procedure: ESOPHAGEAL MANOMETRY (EM);  Surgeon: Laurence Spates, MD;  [facility];  Service: Endoscopy;  Laterality: N/A;  . ESOPHAGOGASTRODUODENOSCOPY  05/02/2011   Procedure: ESOPHAGOGASTRODUODENOSCOPY (EGD);  Surgeon: Winfield Cunas., MD;  Location: [facility];  Service: Endoscopy;  Laterality: N/A;  . ESOPHAGOGASTRODUODENOSCOPY N/A 09/01/2014   Procedure: ESOPHAGOGASTRODUODENOSCOPY (EGD);  Surgeon: Laurence Spates, MD;  Location: [facility];  Service: Endoscopy;  Laterality: N/A;  . KNEE SURGERY    . LAPAROSCOPY    . laproscopic abdominal    . [place] IMPEDANCE STUDY N/A 11/04/2015   Procedure: [place]  IMPEDANCE STUDY;  Surgeon: Laurence Spates, MD;  [facility];  Service: Endoscopy;  Laterality: N/A;  . VAGINAL HYSTERECTOMY      There were no vitals filed for this visit.  Subjective Assessment - 01/14/17 2022    Subjective  Pt reported she continues to not multitasking when eating and her relaxation practices are helpful her greatly.     Pertinent History  C-section, laproscopy 2/2 endometriosis, vaginal hysterectomy,     Patient Stated Goals  being active with spending time with family,. traveling, Zumba class                    Pelvic Floor Special Questions - 01/14/17 2007    External Perineal Exam  tensions at ischioanal fossa and obturator fossa L , urethrea compressae    Pelvic Floor Internal Exam  pt consent verbally after explanation of details to exam. Pt had no contraindications     Exam Type  Vaginal    Palpation  tightness at R obt int and iliococgeus, R perineal scar at 7 clock ( decreased post Tx with improved lengthening and sequential lift)     Strength  weak squeeze, no lift        [facility] Adult PT Treatment/Exercise - 01/14/17 2007      Neuro Re-ed    Neuro Re-ed Details   see pt instructions       Manual Therapy   Internal Pelvic Floor  thiele massage at problem areas noted in assessment  PT Education - 01/14/17 2007    Education provided  Yes    Education Details  HEP    Person(s) Educated  Patient    Methods  Explanation;Demonstration;Tactile cues;Verbal cues;Handout    Comprehension  Returned demonstration;Verbalized understanding          PT Long Term Goals - 01/13/17 1045      PT LONG TERM GOAL #1   Title  Pt will decrease her ODI score from 32% to  27% in order to return to ADLs    Time  8    Period  Weeks    Status  On-going      PT LONG TERM GOAL #2   Title  Pt will decrease her NIH-CPSI score from 81% in order to resotre pelvic floor function    Time  12    Period  Weeks    Status  On-going       PT LONG TERM GOAL #3   Title  Pt will decrease her COREFO score from 30% to < 25% in order to have improved bowel frequency     Time  12    Period  Weeks    Status  On-going      PT LONG TERM GOAL #4   Title  Pt will report better formed stools with consistency type (Bristol Stool Scale)  Type 4-5 across 75% of the time in order to be to able to go to social events    Time  12    Period  Weeks    Status  Achieved      PT LONG TERM GOAL #5   Title  Pt will demo decreased abdominal scar restrictions in order to progress to deep core coordination and strengthening    Time  2    Period  Weeks    Status  Achieved      Additional Long Term Goals   Additional Long Term Goals  Yes      PT LONG TERM GOAL #6   Title  Pt will report urinary frequency from 2x/ every 2 hours to 1x every 2 hours in order to travel with family     Time  10    Period  Weeks    Status  New      PT LONG TERM GOAL #7   Title  Pt will report not multitasking when eating her meals in order to optimize parasympathetic nervous system for proper digestion and GI health    Time  2    Period  Weeks    Status  Achieved      PT LONG TERM GOAL #8   Title  Pt will             Plan - 01/14/17 2026    Clinical Impression Statement  Pt showed decreased pelvic floor mm tensions post Tx. Pt was initiated to standing stretches for pelvic floor to incorporate into her work breaks. Pt demo'd improved pelvic floor coordination.  Pt 's frequency of sessions will decrease from 2x week to 1x week due to pt's improvements.  Pt continues to benefit from skilled PT.     Rehab Potential  Good    PT Frequency  2x / week    PT Duration  12 weeks    PT Treatment/Interventions  Therapeutic activities;Therapeutic exercise;Functional mobility training;Neuromuscular re-education;Manual techniques;Patient/family education;Moist Heat;Balance training;Stair training;Energy conservation;Taping    Consulted and Agree with Plan of Care   Patient  Patient will benefit from skilled therapeutic intervention in order to improve the following deficits and impairments:  Improper body mechanics, Decreased range of motion, Decreased activity tolerance, Decreased safety awareness, Decreased strength, Hypermobility, Increased fascial restricitons, Postural dysfunction, Pain, Decreased endurance, Decreased balance, Increased muscle spasms, Decreased mobility, Decreased coordination  Visit Diagnosis: Other lack of coordination  Myalgia  Other symptoms and signs involving the musculoskeletal system     Problem List Patient Active Problem List   Diagnosis Date Noted  . Chest pain 12/13/2012  . Dyspnea 12/13/2012    Jerl Mina ,PT, DPT, E-RYT  01/14/2017, 8:26 PM  [place] MAIN [facility] SERVICES [address] [place], [place], [address] Phone: [phone]   Fax:  [phone]  Name: Karen [name] MRN: [id number] Date of Birth: 12-Aug-1970

## 2017-01-17 ENCOUNTER — Ambulatory Visit: Payer: 59 | Admitting: Physical Therapy

## 2017-01-17 DIAGNOSIS — R29898 Other symptoms and signs involving the musculoskeletal system: Secondary | ICD-10-CM | POA: Diagnosis not present

## 2017-01-17 DIAGNOSIS — R278 Other lack of coordination: Secondary | ICD-10-CM

## 2017-01-17 DIAGNOSIS — M791 Myalgia, unspecified site: Secondary | ICD-10-CM | POA: Diagnosis not present

## 2017-01-17 NOTE — Therapy (Signed)
[place] MAIN [facility] SERVICES [address] [place], [place], [address] Phone: [phone]   Fax:  [phone]  Physical Therapy Treatment  Patient Details  Name: Karen [name] MRN: [id number] Date of Birth: 26-Feb-1970 Referring Provider: Jacquiline Doe, MD    Encounter Date: 01/17/2017  PT End of Session - 01/17/17 1005    Visit Number  7    Number of Visits  12    Date for PT Re-Evaluation  03/10/17    PT Start Time  0910    PT Stop Time  1005    PT Time Calculation (min)  55 min    Activity Tolerance  Patient tolerated treatment well;No increased pain    Behavior During Therapy  WFL for tasks assessed/performed       Past Medical History:  Diagnosis Date  . Adenomyosis   . Arthritis 2013   L knee gets steroid injections   . DVT of lower extremity (deep venous thrombosis) ([place])   . Dysmenorrhea   . Endometriosis   . Fibroids   . GERD (gastroesophageal reflux disease)   . Kidney stones   . Menorrhagia   . Migraine   . Sebaceous cyst of breast, right lower inner quadrant 10/25/2012   Excised 11/21/12   . Syncope, non cardiac     Past Surgical History:  Procedure Laterality Date  . ARTHROSCOPIC HAGLUNDS REPAIR    . BREAST CYST EXCISION Right 11/21/2012   Procedure: CYST EXCISION BREAST;  Surgeon: Haywood Lasso, MD;  Location: [place];  Service: General;  Laterality: Right;  . CESAREAN SECTION    . CHOLECYSTECTOMY    . COLONOSCOPY  05/02/2011   Procedure: COLONOSCOPY;  Surgeon: Winfield Cunas., MD;  Location: [facility];  Service: Endoscopy;  Laterality: N/A;  . colonoscopy  11/04/2014  . ESOPHAGEAL MANOMETRY N/A 11/04/2015   Procedure: ESOPHAGEAL MANOMETRY (EM);  Surgeon: Laurence Spates, MD;  [facility];  Service: Endoscopy;  Laterality: N/A;  . ESOPHAGOGASTRODUODENOSCOPY  05/02/2011   Procedure: ESOPHAGOGASTRODUODENOSCOPY (EGD);  Surgeon: Winfield Cunas., MD;  Location[facility];  Service: Endoscopy;  Laterality: N/A;  . ESOPHAGOGASTRODUODENOSCOPY N/A 09/01/2014   Procedure: ESOPHAGOGASTRODUODENOSCOPY (EGD);  Surgeon: Laurence Spates, MD;  Location: [facility];  Service: Endoscopy;  Laterality: N/A;  . KNEE SURGERY    . LAPAROSCOPY    . laproscopic abdominal    . [place] IMPEDANCE STUDY N/A 11/04/2015   Procedure: [place] IMPEDANCE STUDY;  Surgeon: Laurence Spates, MD;  [facility];  Service: Endoscopy;  Laterality: N/A;  . VAGINAL HYSTERECTOMY      There were no vitals filed for this visit.  Subjective Assessment - 01/17/17 0952    Subjective  Pt reported she her leakage is better    Pertinent History  C-section, laproscopy 2/2 endometriosis, vaginal hysterectomy,     Patient Stated Goals  being active with spending time with family,. traveling, Zumba class          [facility] PT Assessment - 01/17/17 0959      Strength   Overall Strength  -- pre Tx: L ankle eversion 3/5, R 5/5 post Tx: L 4/5.     Overall Strength Comments  L hip flexion, plantarflexion seated, knee flexion/ ext  3+/5, R 4+/5       Palpation   Palpation comment  limited L  DF/ eversion   L, hypomobility at cuboid, cunieform lateral   ( post Tx improved)  Pelvic Floor Special Questions - 01/17/17 1002    External Perineal Exam  tensions at ischioanal fossa and obturator fossa L , urethrea compressae    Pelvic Floor Internal Exam  pt consent verbally after explanation of details to exam. Pt had no contraindications     Exam Type  Vaginal    Palpation  no tightness R obt int.  increased tightenss at bulbospongious L, anterior attachments at pubic tubercle  lowered urethra pre Tx, cranial position post Tx     Strength  fair squeeze, definite lift        [facility] Adult PT Treatment/Exercise - 01/17/17 1004      Neuro Re-ed    Neuro Re-ed Details   see pt instructions       Manual Therapy   Manual therapy comments  medial glide and AP/PA mob at cuboid,  cuneiforms L  to faciliate dorsiflexion and eversion    Internal Pelvic Floor  thiele massage at problem areas noted in assessment              PT Education - 01/17/17 1005    Education provided  Yes    Education Details  HEP    Person(s) Educated  Patient    Methods  Explanation;Demonstration;Tactile cues;Verbal cues;Handout    Comprehension  Returned demonstration;Verbalized understanding          PT Long Term Goals - 01/13/17 1045      PT LONG TERM GOAL #1   Title  Pt will decrease her ODI score from 32% to  27% in order to return to ADLs    Time  8    Period  Weeks    Status  On-going      PT LONG TERM GOAL #2   Title  Pt will decrease her NIH-CPSI score from 81% in order to resotre pelvic floor function    Time  12    Period  Weeks    Status  On-going      PT LONG TERM GOAL #3   Title  Pt will decrease her COREFO score from 30% to < 25% in order to have improved bowel frequency     Time  12    Period  Weeks    Status  On-going      PT LONG TERM GOAL #4   Title  Pt will report better formed stools with consistency type (Bristol Stool Scale)  Type 4-5 across 75% of the time in order to be to able to go to social events    Time  12    Period  Weeks    Status  Achieved      PT LONG TERM GOAL #5   Title  Pt will demo decreased abdominal scar restrictions in order to progress to deep core coordination and strengthening    Time  2    Period  Weeks    Status  Achieved      Additional Long Term Goals   Additional Long Term Goals  Yes      PT LONG TERM GOAL #6   Title  Pt will report urinary frequency from 2x/ every 2 hours to 1x every 2 hours in order to travel with family     Time  10    Period  Weeks    Status  New      PT LONG TERM GOAL #7   Title  Pt will report not multitasking when eating her meals in order to optimize parasympathetic  nervous system for proper digestion and GI health    Time  2    Period  Weeks    Status  Achieved      PT LONG  TERM GOAL #8   Title            Plan - 01/17/17 1005    Clinical Impression Statement  Pt demo'd signficantly less pelvic floor mm tensions and required manual Tx at anterior mm to faciliate a more cranial position of urethra/ bladder. Pt acheived a more circumferential, sequential , and cranial movements of pelvic floor with an increased strength grade from 2/5 to 3/5. Pt required excessive cues for feet co-activation to elicit stronger pelvic floor.  Applied interregional dependenc approach to optimize pelvic floor activity given her Hx of LLE injuries along kinetic chain.  Pt showed signficantly weaker LLE than RLE and dorsiflexion/eversion at L ankle. Applied manual Tx to faciliate mobility inmidfoot joints to elicit stronger ankle eversion and mobility. Initiated LLE strengthening of hip flexion, plantflexion, ankle eversion. Pt reports her urinary leakage is improving. Pt would like to return to Zumba and yoga classes. Given her presentation of lower urethra today, pt continues to requred skilled PT to minimize risk of pelvic organ prolapse as she returns to loaded activities in fitness.       Rehab Potential  Good    PT Frequency  2x / week    PT Duration  12 weeks    PT Treatment/Interventions  Therapeutic activities;Therapeutic exercise;Functional mobility training;Neuromuscular re-education;Manual techniques;Patient/family education;Moist Heat;Balance training;Stair training;Energy conservation;Taping    Consulted and Agree with Plan of Care  Patient       Patient will benefit from skilled therapeutic intervention in order to improve the following deficits and impairments:  Improper body mechanics, Decreased range of motion, Decreased activity tolerance, Decreased safety awareness, Decreased strength, Hypermobility, Increased fascial restricitons, Postural dysfunction, Pain, Decreased endurance, Decreased balance, Increased muscle spasms, Decreased mobility, Decreased  coordination  Visit Diagnosis: Other lack of coordination  Myalgia  Other symptoms and signs involving the musculoskeletal system     Problem List Patient Active Problem List   Diagnosis Date Noted  . Chest pain 12/13/2012  . Dyspnea 12/13/2012    Jerl Mina 01/17/2017, 10:21 AM  [place] MAIN [facility] SERVICES [address] [place], [place], [address] Phone: [phone]   Fax:  [phone]  Name: Karen [name] MRN: [id number] Date of Birth: 05/28/1970

## 2017-01-17 NOTE — Patient Instructions (Signed)
"  J" scoop with breathing on exhale,   urethra , low belly , ribs sink  FEET FIRM   _________   Strengthening L leg    _Red band under [place] of L   Elbow by side   _hip flexor muscle: knee up and down foot down 20 x  _plantarflexion: letting off the gas pedal 20 x   _ankle eversion:    Toes spread, foot up and pinky toe sweep out

## 2017-01-20 ENCOUNTER — Ambulatory Visit
Admission: RE | Admit: 2017-01-20 | Discharge: 2017-01-20 | Disposition: A | Discharge status: Home / Self Care | Payer: 59 | Source: Ambulatory Visit | Attending: Internal Medicine | Admitting: Internal Medicine

## 2017-01-20 ENCOUNTER — Encounter: Payer: 59 | Admitting: Physical Therapy

## 2017-01-20 DIAGNOSIS — Z139 Encounter for screening, unspecified: Secondary | ICD-10-CM

## 2017-01-23 ENCOUNTER — Other Ambulatory Visit: Payer: Self-pay | Admitting: Internal Medicine

## 2017-01-23 DIAGNOSIS — N6009 Solitary cyst of unspecified breast: Secondary | ICD-10-CM

## 2017-01-25 ENCOUNTER — Encounter: Payer: 59 | Admitting: Physical Therapy

## 2017-01-27 ENCOUNTER — Ambulatory Visit: Payer: 59 | Admitting: Physical Therapy

## 2017-01-27 DIAGNOSIS — R278 Other lack of coordination: Secondary | ICD-10-CM | POA: Diagnosis not present

## 2017-01-27 DIAGNOSIS — M791 Myalgia, unspecified site: Secondary | ICD-10-CM | POA: Diagnosis not present

## 2017-01-27 DIAGNOSIS — R29898 Other symptoms and signs involving the musculoskeletal system: Secondary | ICD-10-CM

## 2017-01-27 NOTE — Therapy (Signed)
[place] MAIN [facility] SERVICES [address] [place], [place], [address] Phone: [phone]   Fax:  [phone]  Physical Therapy Treatment  Patient Details  Name: Karen [name] MRN: [id number] Date of Birth: 1970-09-23 Referring Provider: Jacquiline Doe, MD    Encounter Date: 01/27/2017  PT End of Session - 01/27/17 1042    Visit Number  8    Number of Visits  12    Date for PT Re-Evaluation  03/10/17    PT Start Time  0910    PT Stop Time  1010    PT Time Calculation (min)  60 min    Activity Tolerance  Patient tolerated treatment well;No increased pain    Behavior During Therapy  WFL for tasks assessed/performed       Past Medical History:  Diagnosis Date  . Adenomyosis   . Arthritis 2013   L knee gets steroid injections   . DVT of lower extremity (deep venous thrombosis) ([place])   . Dysmenorrhea   . Endometriosis   . Fibroids   . GERD (gastroesophageal reflux disease)   . Kidney stones   . Menorrhagia   . Migraine   . Sebaceous cyst of breast, right lower inner quadrant 10/25/2012   Excised 11/21/12   . Syncope, non cardiac     Past Surgical History:  Procedure Laterality Date  . ARTHROSCOPIC HAGLUNDS REPAIR    . BREAST CYST EXCISION Right 11/21/2012   Procedure: CYST EXCISION BREAST;  Surgeon: Haywood Lasso, MD;  Location: [place];  Service: General;  Laterality: Right;  . CESAREAN SECTION    . CHOLECYSTECTOMY    . COLONOSCOPY  05/02/2011   Procedure: COLONOSCOPY;  Surgeon: Winfield Cunas., MD;  Location: [facility];  Service: Endoscopy;  Laterality: N/A;  . colonoscopy  11/04/2014  . ESOPHAGEAL MANOMETRY N/A 11/04/2015   Procedure: ESOPHAGEAL MANOMETRY (EM);  Surgeon: Laurence Spates, MD;  Location: [facility];  Service: Endoscopy;  Laterality: N/A;  . ESOPHAGOGASTRODUODENOSCOPY  05/02/2011   Procedure: ESOPHAGOGASTRODUODENOSCOPY (EGD);  Surgeon: Winfield Cunas., MD;  Location: [facility];  Service: Endoscopy;  Laterality: N/A;  . ESOPHAGOGASTRODUODENOSCOPY N/A 09/01/2014   Procedure: ESOPHAGOGASTRODUODENOSCOPY (EGD);  Surgeon: Laurence Spates, MD;  Location: [facility];  Service: Endoscopy;  Laterality: N/A;  . KNEE SURGERY    . LAPAROSCOPY    . laproscopic abdominal    . [place] IMPEDANCE STUDY N/A 11/04/2015   Procedure: [place] IMPEDANCE STUDY;  Surgeon: Laurence Spates, MD;  Location: [facility];  Service: Endoscopy;  Laterality: N/A;  . VAGINAL HYSTERECTOMY      There were no vitals filed for this visit.  Subjective Assessment - 01/27/17 0918    Subjective  Pt had an episode of diarrhea with 3-4 trips to the bathroom last week. This is the first time she had diarrhea episodes across 2-3 weeks. pt voiced a LE leg cramp this monring and she has not had that in a long time. Pt has had a DVT in the past.     Pertinent History  C-section, laproscopy 2/2 endometriosis, vaginal hysterectomy,     Patient Stated Goals  being active with spending time with family,. traveling, Zumba class          [facility] PT Assessment - 01/27/17 1044      Observation/Other Assessments   Observations  poor lower kinetic chain activation with exercises    Skin Integrity         Strength  Overall Strength Comments  L hip flexion, plantarflexion seated, knee flexion/ ext  4+/5, R 4+/5. Eversion B 4/5   plantarflexion standing: 25 reps L,   30 reps R                   [facility] Adult PT Treatment/Exercise - 01/27/17 1017      Neuro Re-ed    Neuro Re-ed Details   see pt instructions , excessive cues for dissassociation of trunk from hips, and  propioception training               PT Education - 01/27/17 1007    Education provided  Yes    Education Details  HEP    Person(s) Educated  Patient    Methods  Explanation;Demonstration;Tactile cues;Verbal cues;Handout    Comprehension  Returned demonstration;Verbalized understanding          PT Long Term Goals - 01/27/17 0923       PT LONG TERM GOAL #1   Title  Pt will decrease her ODI score from 32% to  27% in order to return to ADLs    Time  8    Period  Weeks    Status  On-going      PT LONG TERM GOAL #2   Title  Pt will decrease her NIH-CPSI score from 81% in order to resotre pelvic floor function    Time  12    Period  Weeks    Status  On-going      PT LONG TERM GOAL #3   Title  Pt will decrease her COREFO score from 30% to < 25% in order to have improved bowel frequency     Time  12    Period  Weeks    Status  On-going      PT LONG TERM GOAL #4   Title  Pt will report better formed stools with consistency type (Bristol Stool Scale)  Type 4-5 across 75% of the time in order to be to able to go to social events    Time  12    Period  Weeks    Status  Achieved      PT LONG TERM GOAL #5   Title  Pt will demo decreased abdominal scar restrictions in order to progress to deep core coordination and strengthening    Time  2    Period  Weeks    Status  Achieved      PT LONG TERM GOAL #6   Title  Pt will report urinary frequency from 2x/ every 2 hours to 1x every 2 hours in order to travel with family     Time  10    Period  Weeks    Status  Achieved      PT LONG TERM GOAL #7   Title  Pt will report not multitasking when eating her meals in order to optimize parasympathetic nervous system for proper digestion and GI health    Time  2    Period  Weeks    Status  Achieved      PT LONG TERM GOAL #8   Title  Pt will demo proper technique with coactivation of pelvic floor and deep core in fitness routines in order to minimzie pelvic floor dysfunction nor relapse of Sx     Time  8    Period  Weeks    Status  On-going            Plan -  01/27/17 1042    Clinical Impression Statement  Pt showed increased LE strength and  progressed to propioception today. Pt requried excessive cues w/ disassociation bewteen trunk and BLE. Pt also required more cues for increased activation on BLE. Resistance  training to strengthen multifidis and obliques were provided. Pt demo'd correct form by the end of session. Pt stated last week was the first time she had a diarrhea episode across 2-3 weeks. Pt continues to benefit from skilled PT.     Rehab Potential  Good    PT Frequency  1x / week    PT Duration  12 weeks    PT Treatment/Interventions  Therapeutic activities;Therapeutic exercise;Functional mobility training;Neuromuscular re-education;Manual techniques;Patient/family education;Moist Heat;Balance training;Stair training;Energy conservation;Taping    Consulted and Agree with Plan of Care  Patient       Patient will benefit from skilled therapeutic intervention in order to improve the following deficits and impairments:  Improper body mechanics, Decreased range of motion, Decreased activity tolerance, Decreased safety awareness, Decreased strength, Hypermobility, Increased fascial restricitons, Postural dysfunction, Pain, Decreased endurance, Decreased balance, Increased muscle spasms, Decreased mobility, Decreased coordination  Visit Diagnosis: Other lack of coordination  Myalgia  Other symptoms and signs involving the musculoskeletal system     Problem List Patient Active Problem List   Diagnosis Date Noted  . Chest pain 12/13/2012  . Dyspnea 12/13/2012    Jerl Mina ,PT, DPT, E-RYT  01/27/2017, 10:44 AM  [place] MAIN [facility] SERVICES [address] [place], [place], [address] Phone: [phone]   Fax:  [phone]  Name: PINKY RAVAN MRN: [id number] Date of Birth: 07/05/1970

## 2017-01-27 NOTE — Patient Instructions (Signed)
Multifidis twist  Band is on doorknob: stand further away from door (facing perpendicular)   Twisting trunk without moving the hips and knees Hold band at between the ear and shoulder height   Exhale twist,.10-15 deg away from door without moving your hips/ knees. Continue to maintain equal weight through legs. Keep knee unlocked.  10 x 2    ________  Standing PNF band exercises    "Pulling seat belt"   Band over door knob with knot on the other side of the door Stand perpendicular to the door with feet, hips at 45 deg towards door, leg that is farest away from door is back   Exhale to pull band from R ear height across body to the L pocket without turning pelvis and knees  Follow hand with eyes/head  10 x 2 reps      ____________  Back stretch with hands at wall or table to lengthen  5 breaths

## 2017-01-30 ENCOUNTER — Encounter: Payer: 59 | Admitting: Physical Therapy

## 2017-01-31 ENCOUNTER — Encounter: Payer: Self-pay | Admitting: Allergy and Immunology

## 2017-01-31 ENCOUNTER — Ambulatory Visit
Admission: RE | Admit: 2017-01-31 | Discharge: 2017-01-31 | Disposition: A | Discharge status: Home / Self Care | Payer: 59 | Source: Ambulatory Visit | Attending: Internal Medicine | Admitting: Internal Medicine

## 2017-01-31 ENCOUNTER — Telehealth: Payer: Self-pay

## 2017-01-31 ENCOUNTER — Ambulatory Visit: Payer: 59 | Admitting: Allergy and Immunology

## 2017-01-31 VITALS — BP 124/84 | HR 78 | Ht 67.5 in | Wt 197.6 lb

## 2017-01-31 DIAGNOSIS — Z91018 Allergy to other foods: Secondary | ICD-10-CM | POA: Diagnosis not present

## 2017-01-31 DIAGNOSIS — J3089 Other allergic rhinitis: Secondary | ICD-10-CM

## 2017-01-31 DIAGNOSIS — N6011 Diffuse cystic mastopathy of right breast: Secondary | ICD-10-CM | POA: Diagnosis not present

## 2017-01-31 DIAGNOSIS — R198 Other specified symptoms and signs involving the digestive system and abdomen: Secondary | ICD-10-CM | POA: Insufficient documentation

## 2017-01-31 DIAGNOSIS — R928 Other abnormal and inconclusive findings on diagnostic imaging of breast: Secondary | ICD-10-CM | POA: Diagnosis not present

## 2017-01-31 DIAGNOSIS — J4520 Mild intermittent asthma, uncomplicated: Secondary | ICD-10-CM | POA: Diagnosis not present

## 2017-01-31 DIAGNOSIS — J45909 Unspecified asthma, uncomplicated: Secondary | ICD-10-CM

## 2017-01-31 DIAGNOSIS — N6009 Solitary cyst of unspecified breast: Secondary | ICD-10-CM

## 2017-01-31 DIAGNOSIS — N6012 Diffuse cystic mastopathy of left breast: Secondary | ICD-10-CM | POA: Diagnosis not present

## 2017-01-31 HISTORY — DX: Unspecified asthma, uncomplicated: J45.909

## 2017-01-31 MED ORDER — FLUTICASONE PROPIONATE HFA 220 MCG/ACT IN AERO: INHALATION_SPRAY | RESPIRATORY_TRACT | 12 refills | Status: DC

## 2017-01-31 MED ORDER — AZELASTINE-FLUTICASONE 137-50 MCG/ACT NA SUSP: 1.0000 | Freq: Every day | NASAL | 5 refills | Status: DC

## 2017-01-31 MED ORDER — CARBINOXAMINE MALEATE 6 MG PO TABS: 6.0000 mg | ORAL_TABLET | Freq: Four times a day (QID) | ORAL | 3 refills | Status: DC | PRN

## 2017-01-31 MED ORDER — ALBUTEROL SULFATE HFA 108 (90 BASE) MCG/ACT IN AERS: 2.0000 | INHALATION_SPRAY | RESPIRATORY_TRACT | 3 refills | Status: DC | PRN

## 2017-01-31 MED FILL — VENTOLIN HFA 90 MCG INHALER: 108 (90 BAS | 17 days supply | Qty: 18 | Fill #0

## 2017-01-31 MED FILL — RELISTOR 150 MG TABLET: 150 | 30 days supply | Qty: 60 | Fill #2

## 2017-01-31 NOTE — Patient Instructions (Addendum)
Perennial allergic rhinitis with a nonallergic component  Aeroallergen avoidance measures have been discussed and provided in written form.  A prescription has been provided for azelastine nasal spray, one spray per nostril 1-2 times daily as needed. Proper nasal spray technique has been discussed and demonstrated.  Nasal saline lavage (NeilMed) has been recommended as needed and prior to medicated nasal sprays along with instructions for proper administration.  A prescription has been provided for RyVent (carbinoxamine maleate) 6mg  every 6-8 hours as needed.  Asthmatic bronchitis  During respiratory tract infections or asthma flares, add Flovent 220g 2 inhalations 2 times per day until symptoms have returned to baseline.  To maximize pulmonary deposition, a spacer has been provided along with instructions for its proper administration with an HFA inhaler.  A prescription has been provided for albuterol HFA, 1-2 inhalations every 6 hours if needed.  Subjective and objective measures of pulmonary function will be followed and the treatment plan will be adjusted accordingly.  History of food allergy Gastrointestinal symptoms, uncertain etiology. Skin tests to select food allergens were negative today. The negative predictive value of food allergen skin testing is excellent (approximately 95%). While this does not appear to be an IgE mediated issue, skin testing does not rule out food intolerances or cell-mediated enteropathies which may lend to GI symptoms. These etiologies are suggested when elimination of the responsible food leads to symptom resolution and re-introduction of the food is followed by the return of symptoms.   The patient has been encouraged to keep a careful symptom/food journal and eliminate any food suspected of correlating with symptoms. Should symptoms concerning for anaphylaxis arise, 911 is to be called immediately.  If GI symptoms persist or progress, further  gastroenterologist evaluation may be warranted.   Return in about 3 months (around 05/01/2017), or if symptoms worsen or fail to improve.  Control of Mold Allergen  Mold and fungi can grow on a variety of surfaces provided certain temperature and moisture conditions exist.  Outdoor molds grow on plants, decaying vegetation and soil.  The major outdoor mold, Alternaria and Cladosporium, are found in very high numbers during hot and dry conditions.  Generally, a late Summer - Fall peak is seen for common outdoor fungal spores.  Rain will temporarily lower outdoor mold spore count, but counts rise rapidly when the rainy period ends.  The most important indoor molds are Aspergillus and Penicillium.  Dark, humid and poorly ventilated basements are ideal sites for mold growth.  The next most common sites of mold growth are the bathroom and the kitchen.  Outdoor [employer] 2. Use air conditioning and keep windows closed 3. Avoid exposure to decaying vegetation. 4. Avoid leaf raking. 5. Avoid grain handling. 6. Consider wearing a face mask if working in moldy areas.  Indoor Mold Control 1. Maintain humidity below 50%. 2. Clean washable surfaces with 5% bleach solution. 3. Remove sources e.g. Contaminated carpets.

## 2017-01-31 NOTE — Progress Notes (Signed)
New Patient Note  RE: Karen [name] MRN: [id number] DOB: [birth year] Date of Office Visit: 01/31/2017  Referring provider: Leeroy Cha,* Primary care provider: Leeroy Cha, MD  Chief Complaint: Nasal Congestion; Wheezing; and Food Intolerance   History of present illness: Karen [name] is a 47 y.o. female seen today in consultation requested by Leeroy Cha, MD. She complains of nasal congestion, rhinorrhea, sneezing, postnasal drainage, throat irritation, nasal pruritus, ocular pruritus, and occasional sinus pressure.  These symptoms occur year around but tend to be more frequent and severe in the spring time and in the fall.  She is tried cetirizine and loratadine without adequate symptom relief.  Levocetirizine has provided relief, however this medication makes her eyes feel dry.  She reports that 2 or 3 times per year over the past couple years she has had prolonged bouts of bronchitis requiring systemic steroids and/or antibiotics.  Her symptoms consisted of coughing, dyspnea, chest tightness, and wheezing.  She has been prescribed albuterol and Qvar with benefit during these exacerbations. Jada reports that since early 2016 she has experienced abdominal pain, bloating, gas, nausea, and constipation.  She believes these GI symptoms may be related to the consumption of cows milk and dairy products and tomato-based foods.  She is currently being followed by a gastroenterologist and has been diagnosed with mixed IBS.   Assessment and plan: Perennial allergic rhinitis with a nonallergic component  Aeroallergen avoidance measures have been discussed and provided in written form.  A prescription has been provided for azelastine nasal spray, one spray per nostril 1-2 times daily as needed. Proper nasal spray technique has been discussed and demonstrated.  Nasal saline lavage (NeilMed) has been recommended as needed and prior to medicated nasal sprays  along with instructions for proper administration.  A prescription has been provided for RyVent (carbinoxamine maleate) 6mg  every 6-8 hours as needed.  Asthmatic bronchitis  During respiratory tract infections or asthma flares, add Flovent 220g 2 inhalations 2 times per day until symptoms have returned to baseline.  To maximize pulmonary deposition, a spacer has been provided along with instructions for its proper administration with an HFA inhaler.  A prescription has been provided for albuterol HFA, 1-2 inhalations every 6 hours if needed.  Subjective and objective measures of pulmonary function will be followed and the treatment plan will be adjusted accordingly.  History of food allergy Gastrointestinal symptoms, uncertain etiology. Skin tests to select food allergens were negative today. The negative predictive value of food allergen skin testing is excellent (approximately 95%). While this does not appear to be an IgE mediated issue, skin testing does not rule out food intolerances or cell-mediated enteropathies which may lend to GI symptoms. These etiologies are suggested when elimination of the responsible food leads to symptom resolution and re-introduction of the food is followed by the return of symptoms.   The patient has been encouraged to keep a careful symptom/food journal and eliminate any food suspected of correlating with symptoms. Should symptoms concerning for anaphylaxis arise, 911 is to be called immediately.  If GI symptoms persist or progress, further gastroenterologist evaluation may be warranted.   Meds ordered this encounter  Medications  . Azelastine-Fluticasone 137-50 MCG/ACT SUSP    Sig: Place 1 spray into the nose daily.    Dispense:  1 Bottle    Refill:  5  . Carbinoxamine Maleate (RYVENT) 6 MG TABS    Sig: Take 6 mg by mouth every 6 (six) hours as needed (every 6-8 hours  as needed).    Dispense:  120 tablet    Refill:  3  . fluticasone (FLOVENT HFA)  220 MCG/ACT inhaler    Sig: Take 2 puffs 2 times daily with spacer    Dispense:  1 Inhaler    Refill:  12  . albuterol (PROAIR HFA) 108 (90 Base) MCG/ACT inhaler    Sig: Inhale 2 puffs into the lungs every 4 (four) hours as needed for wheezing or shortness of breath.    Dispense:  1 Inhaler    Refill:  3    Diagnostics: Spirometry: FVC was 2.54 L and FEV1 was 2.00 L (73% predicted) without postbronchodilator improvement.  This study was performed while the patient was asymptomatic.  Please see scanned spirometry results for details. Epicutaneous testing: Negative despite a positive histamine control. Intradermal testing: Borderline positive to mold. Food allergen skin testing: Negative despite a positive histamine control.    Physical examination: Blood pressure 124/84, pulse 78, height 5' 7.5" (1.715 m), weight 197 lb 9.6 oz (89.6 kg), SpO2 98 %.  General: Alert, interactive, in no acute distress. HEENT: TMs pearly gray, turbinates moderately edematous with thick discharge, post-pharynx erythematous. Neck: Supple without lymphadenopathy. Lungs: Clear to auscultation without wheezing, rhonchi or rales. CV: Normal S1, S2 without murmurs. Abdomen: Nondistended, nontender. Skin: Warm and dry, without lesions or rashes. Extremities:  No clubbing, cyanosis or edema. Neuro:   Grossly intact.  Review of systems:  Review of systems negative except as noted in HPI / PMHx or noted below: Review of Systems  Constitutional: Negative.   HENT: Negative.   Eyes: Negative.   Respiratory: Negative.   Cardiovascular: Negative.   Gastrointestinal: Negative.   Genitourinary: Negative.   Musculoskeletal: Negative.   Skin: Negative.   Neurological: Negative.   Endo/Heme/Allergies: Negative.   Psychiatric/Behavioral: Negative.     Past medical history:  Past Medical History:  Diagnosis Date  . Adenomyosis   . Arthritis 2013   L knee gets steroid injections   . Asthmatic bronchitis  01/31/2017  . DVT of lower extremity (deep venous thrombosis) ([place])   . Dysmenorrhea   . Endometriosis   . Fibroids   . GERD (gastroesophageal reflux disease)   . Kidney stones   . Menorrhagia   . Migraine   . Recurrent upper respiratory infection (URI)   . Sebaceous cyst of breast, right lower inner quadrant 10/25/2012   Excised 11/21/12   . Syncope, non cardiac     Past surgical history:  Past Surgical History:  Procedure Laterality Date  . ARTHROSCOPIC HAGLUNDS REPAIR    . BREAST CYST EXCISION Right 11/21/2012   Procedure: CYST EXCISION BREAST;  Surgeon: Haywood Lasso, MD;  Location: [place];  Service: General;  Laterality: Right;  . CESAREAN SECTION    . CHOLECYSTECTOMY    . COLONOSCOPY  05/02/2011   Procedure: COLONOSCOPY;  Surgeon: Winfield Cunas., MD;  Location: [facility];  Service: Endoscopy;  Laterality: N/A;  . colonoscopy  11/04/2014  . ESOPHAGEAL MANOMETRY N/A 11/04/2015   Procedure: ESOPHAGEAL MANOMETRY (EM);  Surgeon: Laurence Spates, MD;  [facility];  Service: Endoscopy;  Laterality: N/A;  . ESOPHAGOGASTRODUODENOSCOPY  05/02/2011   Procedure: ESOPHAGOGASTRODUODENOSCOPY (EGD);  Surgeon: Winfield Cunas., MD;  Location: [facility];  Service: Endoscopy;  Laterality: N/A;  . ESOPHAGOGASTRODUODENOSCOPY N/A 09/01/2014   Procedure: ESOPHAGOGASTRODUODENOSCOPY (EGD);  Surgeon: Laurence Spates, MD;  Location: [facility];  Service: Endoscopy;  Laterality: N/A;  . KNEE SURGERY    .  LAPAROSCOPY    . laproscopic abdominal    . [place] IMPEDANCE STUDY N/A 11/04/2015   Procedure: [place] IMPEDANCE STUDY;  Surgeon: Laurence Spates, MD;  [facility];  Service: Endoscopy;  Laterality: N/A;  . VAGINAL HYSTERECTOMY      Family history: Family History  Problem Relation Age of Onset  . Colon cancer Father   . Hypertension Father   . Stroke Father   . Heart disease Father   . Heart attack Father   . Colon cancer Mother   . Diabetes  Maternal Uncle   . Stroke Paternal Grandmother   . Heart attack Paternal Grandmother   . Heart attack Maternal Grandmother   . Heart attack Maternal Grandfather   . Cancer Maternal Uncle   . Breast cancer Neg Hx     Social history: Social History   Socioeconomic History  . Marital status: Married    Spouse name: Not on file  . Number of children: Not on file  . Years of education: Not on file  . Highest education level: Not on file  Social Needs  . Financial resource strain: Not on file  . Food insecurity - worry: Not on file  . Food insecurity - inability: Not on file  . Transportation needs - medical: Not on file  . Transportation needs - non-medical: Not on file  Occupational History  . Not on file  Tobacco Use  . Smoking status: Never Smoker  . Smokeless tobacco: Never Used  Substance and Sexual Activity  . Alcohol use: Yes    Comment: occasionally  . Drug use: No  . Sexual activity: Not on file    Comment: married   Other Topics Concern  . Not on file  Social History Narrative  . Not on file   Environmental History: The patient lives in a 49 year old house with carpeting throughout, gas heat, and central air.  There is no known mold/water damage in the home.  She is a non-smoker without pets.  Allergies as of 01/31/2017   No Known Allergies     Medication List        Accurate as of 01/31/17 12:27 PM. Always use your most recent med list.          ACZONE 7.5 % Gel Generic drug:  Dapsone Apply topically.   albuterol 108 (90 Base) MCG/ACT inhaler Commonly known as:  PROAIR HFA Inhale 2 puffs into the lungs every 4 (four) hours as needed for wheezing or shortness of breath.   Azelastine-Fluticasone 137-50 MCG/ACT Susp Place 1 spray into the nose daily.   calcium-vitamin D 250-100 MG-UNIT tablet Take 1 tablet by mouth daily.   Carbinoxamine Maleate 6 MG Tabs Commonly known as:  RYVENT Take 6 mg by mouth every 6 (six) hours as needed (every 6-8 hours  as needed).   clindamycin 1 % gel Commonly known as:  CLINDAGEL Apply topically 2 (two) times daily.   dexlansoprazole 60 MG capsule Commonly known as:  DEXILANT Take 60 mg by mouth daily.   fluticasone 220 MCG/ACT inhaler Commonly known as:  FLOVENT HFA Take 2 puffs 2 times daily with spacer   ibuprofen 200 MG tablet Commonly known as:  ADVIL,MOTRIN Take 200 mg by mouth every 6 (six) hours as needed for moderate pain.   levocetirizine 5 MG tablet Commonly known as:  XYZAL Take 5 mg by mouth every evening.   MULTIVITAMIN & MINERAL PO Take 1 tablet by mouth daily.   ondansetron 8 MG disintegrating tablet  Commonly known as:  ZOFRAN-ODT Take 8 mg by mouth every 8 (eight) hours as needed for nausea or vomiting.   PROBIOTIC PO Take 1 capsule by mouth 4 (four) times daily as needed (for GI upset).   RELISTOR 150 MG Tabs Generic drug:  Methylnaltrexone Bromide Take 2 tablets by mouth 1 day or 1 dose.   vitamin B-12 1000 MCG tablet Commonly known as:  CYANOCOBALAMIN Take 1,000 mcg by mouth daily.   XIIDRA 5 % Soln Generic drug:  Lifitegrast Apply to eye.       Known medication allergies: No Known Allergies  I appreciate the opportunity to take part in Danahi's care. Please do not hesitate to contact me with questions.  Sincerely,   R. Edgar Frisk, MD

## 2017-01-31 NOTE — Assessment & Plan Note (Signed)
Gastrointestinal symptoms, uncertain etiology. Skin tests to select food allergens were negative today. The negative predictive value of food allergen skin testing is excellent (approximately 95%). While this does not appear to be an IgE mediated issue, skin testing does not rule out food intolerances or cell-mediated enteropathies which may lend to GI symptoms. These etiologies are suggested when elimination of the responsible food leads to symptom resolution and re-introduction of the food is followed by the return of symptoms.   The patient has been encouraged to keep a careful symptom/food journal and eliminate any food suspected of correlating with symptoms. Should symptoms concerning for anaphylaxis arise, 911 is to be called immediately.  If GI symptoms persist or progress, further gastroenterologist evaluation may be warranted. 

## 2017-01-31 NOTE — Telephone Encounter (Signed)
Noted  

## 2017-01-31 NOTE — Assessment & Plan Note (Signed)
   During respiratory tract infections or asthma flares, add Flovent 220g 2 inhalations 2 times per day until symptoms have returned to baseline.  To maximize pulmonary deposition, a spacer has been provided along with instructions for its proper administration with an HFA inhaler.  A prescription has been provided for albuterol HFA, 1-2 inhalations every 6 hours if needed.  Subjective and objective measures of pulmonary function will be followed and the treatment plan will be adjusted accordingly.

## 2017-01-31 NOTE — Assessment & Plan Note (Addendum)
   Aeroallergen avoidance measures have been discussed and provided in written form.  A prescription has been provided for azelastine nasal spray, one spray per nostril 1-2 times daily as needed. Proper nasal spray technique has been discussed and demonstrated.  Nasal saline lavage (NeilMed) has been recommended as needed and prior to medicated nasal sprays along with instructions for proper administration.  A prescription has been provided for RyVent (carbinoxamine maleate) 6mg  every 6-8 hours as needed.

## 2017-02-01 ENCOUNTER — Other Ambulatory Visit: Payer: Self-pay

## 2017-02-01 ENCOUNTER — Telehealth: Payer: Self-pay

## 2017-02-01 MED ORDER — AZELASTINE HCL 0.1 % NA SOLN
2.0000 | Freq: Two times a day (BID) | NASAL | 5 refills | Status: DC
Start: 2017-02-01 — End: 2019-03-21

## 2017-02-01 MED FILL — AZELASTINE HCL 137 MCG SPRY: 0.1 | 30 days supply | Qty: 30 | Fill #0

## 2017-02-01 MED FILL — RYVENT 6 MG TABS: 6 | 15 days supply | Qty: 60 | Fill #0

## 2017-02-01 NOTE — Telephone Encounter (Signed)
Pt states her Ryvent is too expensive and that the pharmacy is billing her insurance. I told her that I would call [facility] to verify that they are running it as "cash pay."

## 2017-02-01 NOTE — Telephone Encounter (Signed)
See Routed Telephone Call

## 2017-02-01 NOTE — Telephone Encounter (Signed)
Yes, please call in azelastine. Thanks.

## 2017-02-01 NOTE — Telephone Encounter (Signed)
Spoke with pharmacy. Clarified directions on how to run the Ryvent. Decreased the quantity of #120 to #60 tablets in order to get the rx for $15.00. Pt was made aware and approved. She will pick up today.

## 2017-02-01 NOTE — Telephone Encounter (Signed)
Azelastine sent to pharmacy. Pt made aware.

## 2017-02-03 ENCOUNTER — Ambulatory Visit: Payer: 59 | Attending: Gastroenterology | Admitting: Physical Therapy

## 2017-02-03 DIAGNOSIS — R29898 Other symptoms and signs involving the musculoskeletal system: Secondary | ICD-10-CM | POA: Insufficient documentation

## 2017-02-03 DIAGNOSIS — M791 Myalgia, unspecified site: Secondary | ICD-10-CM | POA: Insufficient documentation

## 2017-02-03 DIAGNOSIS — R278 Other lack of coordination: Secondary | ICD-10-CM | POA: Diagnosis not present

## 2017-02-03 MED ORDER — AZELASTINE HCL 0.1 % NA SOLN
2.0000 | Freq: Two times a day (BID) | NASAL | 5 refills | Status: DC
Start: 2017-02-03 — End: 2017-03-23

## 2017-02-03 NOTE — Patient Instructions (Addendum)
Explained to pt about the importance of screening for sleep apnea given her pulmonary conditions and nocturia as risk factors . Recommended pt to seek PCP for order. PT will also contact PCP __________________  Elnoria Howard TECHNIQUES  ? These techniques are to be used to suppress those abnormally strong URGES to urinate, especially after you have already urinated < 1hr ago.  ? These steps do not have to be followed in order, and not all steps have to be used.   ? The purpose of these steps is to help you regain control of your bladder, to reduce the amount of urinary urgency, frequency, or leaking.  ? They take practice to master in controlling urgency.  Allow yourself to be okay with leaking when you first start practicing these steps. ? Practice these steps first at home, when you do not have to worry as much about leaking.  1. SIT DOWN.  Pressure on the pelvic floor inhibits the bladder.  Further pressure may help, such as sitting on a small rolled up towel. 2. LIGHT "KEGELS" using elevator imagery.  Breathe in, feel pelvic floor lower to "basement" level by the end of the inhalation. Exhale, feel pelvic floor gradually move up to "1st floor" which is the neutral position of pelvic floor. At the end of exhalation, feel pelvic floor lift higher at which you will perform a quick light squeeze (the muscles that hold back gas and urine).  Do not do hard, maximal contractions, as this will quickly fatigue the muscles and cause leakage. Perform this 5 repetitions in this pattern.  3. BREATHE & STAY CALM.  Breathing slowly and remaining calm will inhibit your sympathetic nervous system, which will in turn calm the bladder.   4. DISTRACTION.  Sit with a project that will engage your mind.  Anything that works for you - reading, word puzzles, crochet, knitting, checking email, balancing the checkbook, and so on. 5. VISUALIZATION.  Imagine that you are in a place/situation in which either you cannot or  do not want to leave.  Examples: in a car and cannot stop; lying on a beach with a far walk to a restroom; at dinner with someone special.  If the urge persists after practicing these steps and feel you must go to the bathroom, then it is imperative that you . . . . ? Walk slowly and calmly to the bathroom ? Maintain calm breathing ? Refrain from undressing until you are standing over the toilet Rushing to the restroom will only encourage the strong bladder urges and leaking.  Again, the more you practice, the easier these steps will become.  _________  Techniques for relaxation: self-care: time management in order to return to performing PT exercises  _Gratitude practice  _Positive self-talk, catch yourself when saying guilty no doing the exercises but acknowledge the current week and giving rest and self-care can yield more productive when you return to the exercises  _gesture with the hands for expanding breath (opening, holding space) , palms to low belly for exhale ( return to self)

## 2017-02-03 NOTE — Telephone Encounter (Signed)
Medication was sent 02-01-2017

## 2017-02-04 NOTE — Therapy (Addendum)
[place] MAIN [facility] SERVICES [address] [place], [place], [address] Phone: [phone]   Fax:  [phone]  Physical Therapy Treatment  Patient Details  Name: Karen [name] MRN: [id number] Date of Birth: 1970/10/18 Referring Provider: Jacquiline Doe, MD    Encounter Date: 02/03/2017  PT End of Session - 02/04/17 1520    Visit Number  9    Number of Visits  12    Date for PT Re-Evaluation  03/10/17    PT Start Time  0900    PT Stop Time  1000    PT Time Calculation (min)  60 min    Activity Tolerance  Patient tolerated treatment well;No increased pain    Behavior During Therapy  WFL for tasks assessed/performed       Past Medical History:  Diagnosis Date  . Adenomyosis   . Arthritis 2013   L knee gets steroid injections   . Asthmatic bronchitis 01/31/2017  . DVT of lower extremity (deep venous thrombosis) ([place])   . Dysmenorrhea   . Endometriosis   . Fibroids   . GERD (gastroesophageal reflux disease)   . Kidney stones   . Menorrhagia   . Migraine   . Recurrent upper respiratory infection (URI)   . Sebaceous cyst of breast, right lower inner quadrant 10/25/2012   Excised 11/21/12   . Syncope, non cardiac     Past Surgical History:  Procedure Laterality Date  . ARTHROSCOPIC HAGLUNDS REPAIR    . BREAST CYST EXCISION Right 11/21/2012   Procedure: CYST EXCISION BREAST;  Surgeon: Haywood Lasso, MD;  Location: [facility];  Service: General;  Laterality: Right;  . BREAST EXCISIONAL BIOPSY Right 11/2012  . CESAREAN SECTION    . CHOLECYSTECTOMY    . COLONOSCOPY  05/02/2011   Procedure: COLONOSCOPY;  Surgeon: Winfield Cunas., MD;  Location: [facility];  Service: Endoscopy;  Laterality: N/A;  . colonoscopy  11/04/2014  . ESOPHAGEAL MANOMETRY N/A 11/04/2015   Procedure: ESOPHAGEAL MANOMETRY (EM);  Surgeon: Laurence Spates, MD;  Location: [facility];  Service: Endoscopy;  Laterality: N/A;  .  ESOPHAGOGASTRODUODENOSCOPY  05/02/2011   Procedure: ESOPHAGOGASTRODUODENOSCOPY (EGD);  Surgeon: Winfield Cunas., MD;  Location: [facility];  Service: Endoscopy;  Laterality: N/A;  . ESOPHAGOGASTRODUODENOSCOPY N/A 09/01/2014   Procedure: ESOPHAGOGASTRODUODENOSCOPY (EGD);  Surgeon: Laurence Spates, MD;  Location: [facility];  Service: Endoscopy;  Laterality: N/A;  . KNEE SURGERY    . LAPAROSCOPY    . laproscopic abdominal    . [place] IMPEDANCE STUDY N/A 11/04/2015   Procedure: [place] IMPEDANCE STUDY;  Surgeon: Laurence Spates, MD;  Location: [facility];  Service: Endoscopy;  Laterality: N/A;  . VAGINAL HYSTERECTOMY      There were no vitals filed for this visit.  Subjective Assessment - 02/03/17 0906    Subjective  Pt reported she has the beginning of bronchial infection . Her urinary leakage is getting better. Pt reports her urinary frequency occurs 2-3x / hr      Pertinent History  C-section, laproscopy 2/2 endometriosis, vaginal hysterectomy,     Patient Stated Goals  being active with spending time with family,. traveling, Zumba class                    Pelvic Floor Special Questions - 02/03/17 3267    External Palpation  through clothes: demo'd proper ROM and quick squeeze x 5 for urge suppression technique  PT Long Term Goals - 01/27/17 4034      PT LONG TERM GOAL #1   Title  Pt will decrease her ODI score from 32% to  27% in order to return to ADLs (1/25: 32% )    Time  8    Period  Weeks    Status  On-going      PT LONG TERM GOAL #2   Title  Pt will decrease her NIH-CPSI score from 81% in order to resotre pelvic floor function  ( 1/25: 42%)     Time  12    Period  Weeks    Status  Achieved      PT LONG TERM GOAL #3   Title  Pt will decrease her COREFO score from 30% to < 25% in order to have improved bowel frequency     Time  12    Period  Weeks    Status  On-going      PT LONG TERM GOAL #4   Title  Pt will report better  formed stools with consistency type (Bristol Stool Scale)  Type 4-5 across 75% of the time in order to be to able to go to social events    Time  12    Period  Weeks    Status  Achieved      PT LONG TERM GOAL #5   Title  Pt will demo decreased abdominal scar restrictions in order to progress to deep core coordination and strengthening    Time  2    Period  Weeks    Status  Achieved      Additional Long Term Goals   Additional Long Term Goals  Yes      PT LONG TERM GOAL #6   Title  Pt will report urinary frequency from 2x/ every 2 hours to 1x every 2 hours in order to travel with family     Time  10    Period  Weeks    Status  Achieved      PT LONG TERM GOAL #7   Title  Pt will report not multitasking when eating her meals in order to optimize parasympathetic nervous system for proper digestion and GI health    Time  2    Period  Weeks    Status  Achieved      PT LONG TERM GOAL #8   Title  Pt will demo proper technique with coactivation of pelvic floor and deep core in fitness routines in order to minimzie pelvic floor dysfunction nor relapse of Sx     Time  8    Period  Weeks    Status  On-going      PT LONG TERM GOAL  #9   TITLE  Pt will decrease her PFDI score from 40% to < 30% in order to optimize pelvic floor function    Time  8    Period  Weeks    Status  New    Target Date  03/27/17            Plan - 02/04/17 1520    Clinical Impression Statement Pt  demo'd IND with proper pelvic floor coordination / quick contraction technique and was advanced to the  urge suppression technique. Pt was explained the physiology related to urgency. Pt was also provided biopsychosocial approaches with additional relaxation techniques to help down regulate her nervous sytem which in turn will positive impact her urgency and GI issues.PT was tearful during  appt as she expressed stress in her life. Pt was provided motivational interviewing and active listening.  PT also explained the  benefits to getting screened for sleep study and PT plans to communicate with PCP re: sleep study referral. Pt expressed she is not ready at the current time to pursue a sleep study.  Pt reported feeling more relaxed post training. Pt continues to benefit from skilled PT.    Rehab Potential  Good    PT Frequency  1x / week    PT Duration  12 weeks    PT Treatment/Interventions  Therapeutic activities;Therapeutic exercise;Functional mobility training;Neuromuscular re-education;Manual techniques;Patient/family education;Moist Heat;Balance training;Stair training;Energy conservation;Taping    Consulted and Agree with Plan of Care  Patient       Patient will benefit from skilled therapeutic intervention in order to improve the following deficits and impairments:  Improper body mechanics, Decreased range of motion, Decreased activity tolerance, Decreased safety awareness, Decreased strength, Hypermobility, Increased fascial restricitons, Postural dysfunction, Pain, Decreased endurance, Decreased balance, Increased muscle spasms, Decreased mobility, Decreased coordination  Visit Diagnosis: Other lack of coordination  Myalgia  Other symptoms and signs involving the musculoskeletal system     Problem List Patient Active Problem List   Diagnosis Date Noted  . Perennial allergic rhinitis with a nonallergic component 01/31/2017  . Asthmatic bronchitis 01/31/2017  . History of food allergy 01/31/2017  . Chest pain 12/13/2012  . Dyspnea 12/13/2012    Jerl Mina ,PT, DPT, E-RYT  02/04/2017, 3:21 PM  [place] MAIN [facility] SERVICES [address] [place], [place], [address] Phone: [phone]   Fax:  [phone]  Name: Karen [name] MRN: [id number] Date of Birth: 28-Oct-1970

## 2017-02-06 ENCOUNTER — Ambulatory Visit: Payer: 59 | Admitting: Physical Therapy

## 2017-02-08 ENCOUNTER — Ambulatory Visit: Payer: 59 | Admitting: Physical Therapy

## 2017-02-08 DIAGNOSIS — M791 Myalgia, unspecified site: Secondary | ICD-10-CM | POA: Diagnosis not present

## 2017-02-08 DIAGNOSIS — R29898 Other symptoms and signs involving the musculoskeletal system: Secondary | ICD-10-CM

## 2017-02-08 DIAGNOSIS — R278 Other lack of coordination: Secondary | ICD-10-CM

## 2017-02-08 NOTE — Therapy (Signed)
[place] MAIN [facility] SERVICES [address] [place], [place], [address] Phone: [phone]   Fax:  [phone]  Physical Therapy Treatment  Patient Details  Name: Karen [name] MRN: [id number] Date of Birth: 1970-08-15 Referring Provider: Jacquiline Doe, MD    Encounter Date: 02/08/2017  PT End of Session - 02/08/17 1719    Visit Number  10    Number of Visits  12    Date for PT Re-Evaluation  03/10/17    PT Start Time  0820    PT Stop Time  0930    PT Time Calculation (min)  70 min    Activity Tolerance  Patient tolerated treatment well;No increased pain    Behavior During Therapy  WFL for tasks assessed/performed       Past Medical History:  Diagnosis Date  . Adenomyosis   . Arthritis 2013   L knee gets steroid injections   . Asthmatic bronchitis 01/31/2017  . DVT of lower extremity (deep venous thrombosis) ([place])   . Dysmenorrhea   . Endometriosis   . Fibroids   . GERD (gastroesophageal reflux disease)   . Kidney stones   . Menorrhagia   . Migraine   . Recurrent upper respiratory infection (URI)   . Sebaceous cyst of breast, right lower inner quadrant 10/25/2012   Excised 11/21/12   . Syncope, non cardiac     Past Surgical History:  Procedure Laterality Date  . ARTHROSCOPIC HAGLUNDS REPAIR    . BREAST CYST EXCISION Right 11/21/2012   Procedure: CYST EXCISION BREAST;  Surgeon: Haywood Lasso, MD;  Location: [facility];  Service: General;  Laterality: Right;  . BREAST EXCISIONAL BIOPSY Right 11/2012  . CESAREAN SECTION    . CHOLECYSTECTOMY    . COLONOSCOPY  05/02/2011   Procedure: COLONOSCOPY;  Surgeon: Winfield Cunas., MD;  Location: [facility];  Service: Endoscopy;  Laterality: N/A;  . colonoscopy  11/04/2014  . ESOPHAGEAL MANOMETRY N/A 11/04/2015   Procedure: ESOPHAGEAL MANOMETRY (EM);  Surgeon: Laurence Spates, MD;  [facility]: [facility];  Service: Endoscopy;  Laterality: N/A;  .  ESOPHAGOGASTRODUODENOSCOPY  05/02/2011   Procedure: ESOPHAGOGASTRODUODENOSCOPY (EGD);  Surgeon: Winfield Cunas., MD;  Location: [facility];  Service: Endoscopy;  Laterality: N/A;  . ESOPHAGOGASTRODUODENOSCOPY N/A 09/01/2014   Procedure: ESOPHAGOGASTRODUODENOSCOPY (EGD);  Surgeon: Laurence Spates, MD;  Location: [facility];  Service: Endoscopy;  Laterality: N/A;  . KNEE SURGERY    . LAPAROSCOPY    . laproscopic abdominal    . [place] IMPEDANCE STUDY N/A 11/04/2015   Procedure: [place] IMPEDANCE STUDY;  Surgeon: Laurence Spates, MD;  Location: [facility];  Service: Endoscopy;  Laterality: N/A;  . VAGINAL HYSTERECTOMY      There were no vitals filed for this visit.  Subjective Assessment - 02/08/17 0831    Subjective  Pt reported she had a relaxing weekend. Pt practiced the urge suppression technique which helped some. At night she gets up once a night. Pt is going once an hour instead of twice/hr during the day. Pt notices her urine color is darker than before but she is still drinking more water. Pt has not had diarrhea for 2-3 weeks. Pt had an episode last week and this week but she is not sure if it is due to a new medicine she is taking for her sinus infection.     Pertinent History  C-section, laproscopy 2/2 endometriosis, vaginal hysterectomy,     Patient Stated Goals  being  active with spending time with family,. traveling, Zumba class          [facility] PT Assessment - 02/08/17 1718      Observation/Other Assessments   Observations  difficulty with grapevine, minimal pivot on ballmounds, coordination slow ( improved post Tx)                   [facility] Adult PT Treatment/Exercise - 02/08/17 1719      Neuro Re-ed    Neuro Re-ed Details   see pt instructions , excessive cues for dissassociation of trunk from hips, and  propioception training                    PT Long Term Goals - 02/08/17 1722      PT LONG TERM GOAL #1   Title  Pt will decrease her ODI score from 32%  to < 27% in order to return to ADLs (1/25: 32% ) (2/1: 22%)     Time  8    Period  Weeks    Status  Achieved      PT LONG TERM GOAL #2   Title  Pt will decrease her NIH-CPSI score from 81% in order to resotre pelvic floor function  ( 1/25: 42%, 2/1: 25%  )     Time  12    Period  Weeks    Status  Achieved      PT LONG TERM GOAL #3   Title  Pt will decrease her COREFO score from 30% to < 25% in order to have improved bowel frequency     Time  12    Period  Weeks    Status  On-going      PT LONG TERM GOAL #4   Title  Pt will report better formed stools with consistency type (Bristol Stool Scale)  Type 4-5 across 75% of the time in order to be to able to go to social events    Time  12    Period  Weeks    Status  Achieved      PT LONG TERM GOAL #5   Title  Pt will demo decreased abdominal scar restrictions in order to progress to deep core coordination and strengthening    Time  2    Period  Weeks    Status  Achieved      PT LONG TERM GOAL #6   Title  Pt will report urinary frequency from 2x/ every 2 hours to 1x every 2 hours in order to travel with family     Time  10    Period  Weeks    Status  Achieved      PT LONG TERM GOAL #7   Title  Pt will report not multitasking when eating her meals in order to optimize parasympathetic nervous system for proper digestion and GI health    Time  2    Period  Weeks    Status  Achieved      PT LONG TERM GOAL #8   Title  Pt will demo proper technique with coactivation of pelvic floor ,  deep core, and alignment in fitness routines in order to minimize risks for injury or relapse of Sx    Time  8    Period  Weeks    Status  On-going      PT LONG TERM GOAL  #9   TITLE  Pt will decrease her PFDI score from 40% to <  30% in order to optimize pelvic floor function  (2/1: 39%)      Time  8    Period  Weeks    Status  On-going            Plan - 02/08/17 1719    Clinical Impression Statement  Pt is progressing well with  strengthening  and coordination exercises. Pt demo'd simulated Zumba exercises with proper technique following excessive verbal cues. Pt was provided guidance on frequency of classess per week to minimize relapse of Sx or over activity. Pt required excessive cuing on proper weight bearing in BLE throughout new upright exercises and cues for posterior tilt of pelvis, unlocking of knees, and more weight on mid/forefoot to minimize low back pain. Pt continues to benefit from skilled PT    Rehab Potential  Good    PT Frequency  1x / week    PT Duration  12 weeks    PT Treatment/Interventions  Therapeutic activities;Therapeutic exercise;Functional mobility training;Neuromuscular re-education;Manual techniques;Patient/family education;Moist Heat;Balance training;Stair training;Energy conservation;Taping    Consulted and Agree with Plan of Care  Patient       Patient will benefit from skilled therapeutic intervention in order to improve the following deficits and impairments:  Improper body mechanics, Decreased range of motion, Decreased activity tolerance, Decreased safety awareness, Decreased strength, Hypermobility, Increased fascial restricitons, Postural dysfunction, Pain, Decreased endurance, Decreased balance, Increased muscle spasms, Decreased mobility, Decreased coordination  Visit Diagnosis: Other lack of coordination  Myalgia  Other symptoms and signs involving the musculoskeletal system     Problem List Patient Active Problem List   Diagnosis Date Noted  . Perennial allergic rhinitis with a nonallergic component 01/31/2017  . Asthmatic bronchitis 01/31/2017  . History of food allergy 01/31/2017  . Chest pain 12/13/2012  . Dyspnea 12/13/2012    Jerl Mina ,PT, DPT, E-RYT  02/08/2017, 5:23 PM  [place] MAIN [facility] SERVICES [address] [place], [place], [address] Phone: [phone]   Fax:  [phone]  Name: Karen [name] MRN:  [id number] Date of Birth: 05-28-1970

## 2017-02-08 NOTE — Patient Instructions (Signed)
Heel raises 10 reps x 3 days   Stairclimbing: higher thighs, push off with ballmounds  Stairdescent: know where your feet goes    ________   Sidestepping with feet hip width hallway 2 x lap each side    Grapevine 2 laps ( see video emailed)  Remember to be on ballmounds to be ready for the pivot   _________

## 2017-02-13 ENCOUNTER — Ambulatory Visit: Payer: 59 | Admitting: Physical Therapy

## 2017-02-13 DIAGNOSIS — R278 Other lack of coordination: Secondary | ICD-10-CM | POA: Diagnosis not present

## 2017-02-13 DIAGNOSIS — R29898 Other symptoms and signs involving the musculoskeletal system: Secondary | ICD-10-CM | POA: Diagnosis not present

## 2017-02-13 DIAGNOSIS — M791 Myalgia, unspecified site: Secondary | ICD-10-CM

## 2017-02-13 NOTE — Patient Instructions (Signed)
Glut strengthening   Add mini squat to side stepping Toes slightly out, tracking knee cap along 2-3rd toes ,  Keep all four corners of feet done entire time  Exhale up without locking knees    2 laps in the hallways   ________  Standing hip ext with red band under chair leg,  dragging foot back  10 x each leg, build up to 15 reps by end of the week   _________  Articles on association with OSA and asthma     [email or web address]  [email or web address]  [email or web address]

## 2017-02-14 NOTE — Therapy (Signed)
[place] MAIN [facility] SERVICES [address] [place], [place], [address] Phone: [phone]   Fax:  [phone]  Physical Therapy Treatment  Patient Details  Name: Karen [name] MRN: [id number] Date of Birth: [birth year] Referring Provider: Jacquiline Doe, MD    Encounter Date: 02/13/2017  PT End of Session - 02/13/17 0924    Visit Number  11    Date for PT Re-Evaluation  03/10/17    PT Start Time  0917    PT Stop Time  1000    PT Time Calculation (min)  43 min    Activity Tolerance  Patient tolerated treatment well;No increased pain    Behavior During Therapy  WFL for tasks assessed/performed       Past Medical History:  Diagnosis Date  . Adenomyosis   . Arthritis 2013   L knee gets steroid injections   . Asthmatic bronchitis 01/31/2017  . DVT of lower extremity (deep venous thrombosis) ([place])   . Dysmenorrhea   . Endometriosis   . Fibroids   . GERD (gastroesophageal reflux disease)   . Kidney stones   . Menorrhagia   . Migraine   . Recurrent upper respiratory infection (URI)   . Sebaceous cyst of breast, right lower inner quadrant 10/25/2012   Excised 11/21/12   . Syncope, non cardiac     Past Surgical History:  Procedure Laterality Date  . ARTHROSCOPIC HAGLUNDS REPAIR    . BREAST CYST EXCISION Right 11/21/2012   Procedure: CYST EXCISION BREAST;  Surgeon: Haywood Lasso, MD;  Location: [place];  Service: General;  Laterality: Right;  . BREAST EXCISIONAL BIOPSY Right 11/2012  . CESAREAN SECTION    . CHOLECYSTECTOMY    . COLONOSCOPY  05/02/2011   Procedure: COLONOSCOPY;  Surgeon: Winfield Cunas., MD;  Location: [facility];  Service: Endoscopy;  Laterality: N/A;  . colonoscopy  11/04/2014  . ESOPHAGEAL MANOMETRY N/A 11/04/2015   Procedure: ESOPHAGEAL MANOMETRY (EM);  Surgeon: Laurence Spates, MD;  Location: [facility];  Service: Endoscopy;  Laterality: N/A;  . ESOPHAGOGASTRODUODENOSCOPY  05/02/2011   Procedure: ESOPHAGOGASTRODUODENOSCOPY (EGD);  Surgeon: Winfield Cunas., MD;  Location: [facility];  Service: Endoscopy;  Laterality: N/A;  . ESOPHAGOGASTRODUODENOSCOPY N/A 09/01/2014   Procedure: ESOPHAGOGASTRODUODENOSCOPY (EGD);  Surgeon: Laurence Spates, MD;  Location: [facility];  Service: Endoscopy;  Laterality: N/A;  . KNEE SURGERY    . LAPAROSCOPY    . laproscopic abdominal    . [place] IMPEDANCE STUDY N/A 11/04/2015   Procedure: [place] IMPEDANCE STUDY;  Surgeon: Laurence Spates, MD;  Location: [facility];  Service: Endoscopy;  Laterality: N/A;  . VAGINAL HYSTERECTOMY      There were no vitals filed for this visit.  Subjective Assessment - 02/13/17 0922    Subjective  Pt reported she did not have low back pain after Zumba classes. Pt only had B knee pain afterwards.     Pertinent History  C-section, laproscopy 2/2 endometriosis, vaginal hysterectomy,     Patient Stated Goals  being active with spending time with family,. traveling, Zumba class          [facility] PT Assessment - 02/13/17 0935      Strength   Overall Strength Comments  L hip flexion/ knee ext 4-/5, R 4/5, prone: hip ext 2/5 L, 2+/5 R    Plantarflexion standing L 20, R 25 reps  PT Education - 02/13/17 1009    Education provided  Yes    Education Details  HEP    Person(s) Educated  Patient    Methods  Explanation;Demonstration;Tactile cues;Handout;Verbal cues    Comprehension  Returned demonstration;Verbalized understanding          PT Long Term Goals - 02/13/17 0931      PT LONG TERM GOAL #1   Title  Pt will decrease her ODI score from 32% to < 27% in order to return to ADLs (1/25: 32% ) (2/1: 22%)     Time  8    Period  Weeks    Status  Achieved      PT LONG TERM GOAL #2   Title  Pt will decrease her NIH-CPSI score from 81% in order to resotre pelvic floor function  ( 1/25: 42%, 2/1: 25%  )     Time  12    Period  Weeks    Status  Achieved      PT LONG TERM  GOAL #3   Title  Pt will decrease her COREFO score from 30% to < 25% in order to have improved bowel frequency     Time  12    Period  Weeks    Status  On-going      PT LONG TERM GOAL #4   Title  Pt will report better formed stools with consistency type (Bristol Stool Scale)  Type 4-5 across 75% of the time in order to be to able to go to social events    Time  12    Period  Weeks    Status  Achieved      PT LONG TERM GOAL #5   Title  Pt will demo decreased abdominal scar restrictions in order to progress to deep core coordination and strengthening    Time  2    Period  Weeks    Status  Achieved      Additional Long Term Goals   Additional Long Term Goals  Yes      PT LONG TERM GOAL #6   Title  Pt will report urinary frequency from 2x/ every 2 hours to 1x every 2 hours in order to travel with family     Time  10    Period  Weeks    Status  Achieved      PT LONG TERM GOAL #7   Title  Pt will report not multitasking when eating her meals in order to optimize parasympathetic nervous system for proper digestion and GI health    Time  2    Period  Weeks    Status  Achieved      PT LONG TERM GOAL #8   Title  Pt will demo proper technique with coactivation of pelvic floor ,  deep core, and alignment in fitness routines and yoga  in order to minimize risks for injury or relapse of Sx    Time  8    Period  Weeks    Status  On-going      PT LONG TERM GOAL  #9   TITLE  Pt will decrease her PFDI score from 40% to < 30% in order to optimize pelvic floor function  (2/1: 39%)      Time  8    Period  Weeks    Status  On-going      PT LONG TERM GOAL  #10   TITLE  Pt will report no B knee  pain after ZUMBA classes in order to maintain this type of fitness routine     Time  4    Period  Weeks    Status  New    Target Date  03/13/17            Plan - 02/13/17 0945    Clinical Impression Statement  Pt has had improvement with fecal/ urinary leakage and diarrhea close to 2  months. Pt was having regular bowel movement, better formed stools (Bristol Stool Type 4 instead Type 7) for the past month.  Since she started using sinus medicine and inhaler, her constipation has been worst.  Pt reports she still taking her stool medication while on the sinus medication have not helped with her diarrhea.     Rehab Potential  Good    PT Frequency  1x / week    PT Duration  12 weeks    PT Treatment/Interventions  Therapeutic activities;Therapeutic exercise;Functional mobility training;Neuromuscular re-education;Manual techniques;Patient/family education;Moist Heat;Balance training;Stair training;Energy conservation;Taping    Consulted and Agree with Plan of Care  Patient       Patient will benefit from skilled therapeutic intervention in order to improve the following deficits and impairments:  Improper body mechanics, Decreased range of motion, Decreased activity tolerance, Decreased safety awareness, Decreased strength, Hypermobility, Increased fascial restricitons, Postural dysfunction, Pain, Decreased endurance, Decreased balance, Increased muscle spasms, Decreased mobility, Decreased coordination  Visit Diagnosis: Other lack of coordination  Myalgia  Other symptoms and signs involving the musculoskeletal system     Problem List Patient Active Problem List   Diagnosis Date Noted  . Perennial allergic rhinitis with a nonallergic component 01/31/2017  . Asthmatic bronchitis 01/31/2017  . History of food allergy 01/31/2017  . Chest pain 12/13/2012  . Dyspnea 12/13/2012    Jerl Mina ,PT, DPT, E-RYT  02/14/2017, 4:27 PM  [place] MAIN [facility] SERVICES [address] [place], [place], [address] Phone: [phone]   Fax:  [phone]  Name: Karen [name] MRN: [id number] Date of Birth: 1970-03-24

## 2017-02-16 ENCOUNTER — Encounter: Payer: 59 | Admitting: Physical Therapy

## 2017-02-20 ENCOUNTER — Ambulatory Visit: Payer: 59 | Admitting: Physical Therapy

## 2017-02-22 ENCOUNTER — Ambulatory Visit: Payer: 59 | Admitting: Physical Therapy

## 2017-02-22 DIAGNOSIS — M791 Myalgia, unspecified site: Secondary | ICD-10-CM | POA: Diagnosis not present

## 2017-02-22 DIAGNOSIS — R29898 Other symptoms and signs involving the musculoskeletal system: Secondary | ICD-10-CM | POA: Diagnosis not present

## 2017-02-22 DIAGNOSIS — K929 Disease of digestive system, unspecified: Secondary | ICD-10-CM | POA: Diagnosis not present

## 2017-02-22 DIAGNOSIS — R278 Other lack of coordination: Secondary | ICD-10-CM | POA: Diagnosis not present

## 2017-02-22 DIAGNOSIS — K591 Functional diarrhea: Secondary | ICD-10-CM | POA: Diagnosis not present

## 2017-02-22 NOTE — Patient Instructions (Addendum)
  Deep core level 1 with quick squeeze with immediate relaxation of pelvic floor  1 x day  Progress from Deep core 2 ( knee out)  To Deep core level 3 ( marching)   make sure no arch in back  slow and controlled  2 x day    minisquat with blue band under feet,  Elbows by your ribs, shoulders down and back Focus on tailbone down "zip the zipper in the front"  30 reps  __________  To avoid low back back  during Zumba classes:  Mini squats: tailbone down    Handweights:  _shoulders down and back without hiking up upper traps,  _ less range of motion to avoid upper trap tightness _ standing ski track stance with arm weights exercise  Not just with feet next to another   Avoid doing anything that feels weird or awkward to your body    ____________

## 2017-02-22 NOTE — Therapy (Addendum)
[place] MAIN [facility] SERVICES [address] [place], [place], [address] Phone: [phone]   Fax:  [phone]  Physical Therapy Treatment [facility] Note  Patient Details  Name: Karen [name] MRN: [id number] Date of Birth: January 29, 1970 Referring Provider: Jacquiline Doe, MD    Encounter Date: 02/22/2017  PT End of Session - 02/22/17 1018    Visit Number  12    Date for PT Re-Evaluation  03/10/17    PT Start Time  1010    PT Stop Time  1120    PT Time Calculation (min)  70 min    Activity Tolerance  Patient tolerated treatment well;No increased pain    Behavior During Therapy  WFL for tasks assessed/performed       Past Medical History:  Diagnosis Date  . Adenomyosis   . Arthritis 2013   L knee gets steroid injections   . Asthmatic bronchitis 01/31/2017  . DVT of lower extremity (deep venous thrombosis) ([place])   . Dysmenorrhea   . Endometriosis   . Fibroids   . GERD (gastroesophageal reflux disease)   . Kidney stones   . Menorrhagia   . Migraine   . Recurrent upper respiratory infection (URI)   . Sebaceous cyst of breast, right lower inner quadrant 10/25/2012   Excised 11/21/12   . Syncope, non cardiac     Past Surgical History:  Procedure Laterality Date  . ARTHROSCOPIC HAGLUNDS REPAIR    . BREAST CYST EXCISION Right 11/21/2012   Procedure: CYST EXCISION BREAST;  Surgeon: Haywood Lasso, MD;  Location: [place];  Service: General;  Laterality: Right;  . BREAST EXCISIONAL BIOPSY Right 11/2012  . CESAREAN SECTION    . CHOLECYSTECTOMY    . COLONOSCOPY  05/02/2011   Procedure: COLONOSCOPY;  Surgeon: Winfield Cunas., MD;  Location: [facility];  Service: Endoscopy;  Laterality: N/A;  . colonoscopy  11/04/2014  . ESOPHAGEAL MANOMETRY N/A 11/04/2015   Procedure: ESOPHAGEAL MANOMETRY (EM);  Surgeon: Laurence Spates, MD;  Location: [facility];  Service: Endoscopy;  Laterality: N/A;  .  ESOPHAGOGASTRODUODENOSCOPY  05/02/2011   Procedure: ESOPHAGOGASTRODUODENOSCOPY (EGD);  Surgeon: Winfield Cunas., MD;  Location: [facility];  Service: Endoscopy;  Laterality: N/A;  . ESOPHAGOGASTRODUODENOSCOPY N/A 09/01/2014   Procedure: ESOPHAGOGASTRODUODENOSCOPY (EGD);  Surgeon: Laurence Spates, MD;  Location: [facility];  Service: Endoscopy;  Laterality: N/A;  . KNEE SURGERY    . LAPAROSCOPY    . laproscopic abdominal    . [place] IMPEDANCE STUDY N/A 11/04/2015   Procedure: [place] IMPEDANCE STUDY;  Surgeon: Laurence Spates, MD;  Location: [facility];  Service: Endoscopy;  Laterality: N/A;  . VAGINAL HYSTERECTOMY      There were no vitals filed for this visit.  Subjective Assessment - 02/22/17 1011    Subjective  Pt reported she continues to have diarrhea with the new medications that she takes for her sinus infection. Despite the diarrhea epsiodes, pt is not letting it interfere iwith her activities. Pt has not had a bowel movement since Monday.   Pt continues to enjoy Zumba. Her back has been bothering her since the Zumba class .  Pt is not leaking with sneezing.     Pertinent History  C-section, laproscopy 2/2 endometriosis, vaginal hysterectomy,     Patient Stated Goals  being active with spending time with family,. traveling, Zumba class          [facility] PT Assessment - 02/22/17 1144      Squat  Comments  anterior tilt of pelvis, knees anterior to ankle. Post Tx: improved technique post Tx      Other:   Other/ Comments  simulated Zumba classes: hand weights with over use of upper trap mm, poor scapular control.  Post training: demo'd improved technique and form               Pelvic Floor Special Questions - 02/22/17 1126    Pelvic Floor Internal Exam  pt consent verbally after explanation of details to exam. Pt had no contraindications     Exam Type  Vaginal    Palpation  no tightness and minimal tenderness at previous areas of tightness     Strength  good squeeze, good  lift, able to hold agaisnt strong resistance initially3/5.stronger contraction cued for slower breathing         [facility] Adult PT Treatment/Exercise - 02/22/17 1127      Therapeutic Activites    Other Therapeutic Activities  see pt instructions , excessive cues for dissassociation of trunk from hips, and  propioception training   with simulated Zumba exercises       Neuro Re-ed    Neuro Re-ed Details   see pt instructions , excessive cues for dissassociation of trunk from hips, and  propioception training               PT Education - 02/22/17 1128    Education provided  Yes    Education Details  HEP    Person(s) Educated  Patient    Methods  Explanation;Demonstration;Tactile cues;Verbal cues;Handout    Comprehension  Returned demonstration;Verbalized understanding;Verbal cues required;Tactile cues required          PT Long Term Goals - 02/22/17 1019      PT LONG TERM GOAL #1   Title  Pt will decrease her ODI score from 32% to < 27% in order to return to ADLs (1/25: 32% ) (2/1: 22%)     Time  8    Period  Weeks    Status  Achieved      PT LONG TERM GOAL #2   Title  Pt will decrease her NIH-CPSI score from 81% in order to resotre pelvic floor function  ( 1/25: 42%, 2/1: 25%  )     Time  12    Period  Weeks    Status  Achieved      PT LONG TERM GOAL #3   Title  Pt will decrease her COREFO score from 30% to < 25% in order to have improved bowel frequency  (2/20: 23%)     Time  12    Period  Weeks    Status  Achieved      PT LONG TERM GOAL #4   Title  Pt will report better formed stools with consistency type (Bristol Stool Scale)  Type 4-5 across 75% of the time in order to be to able to go to social events    Time  12    Period  Weeks    Status  Achieved      PT LONG TERM GOAL #5   Title  Pt will demo decreased abdominal scar restrictions in order to progress to deep core coordination and strengthening    Time  2    Period  Weeks    Status  Achieved      PT  LONG TERM GOAL #6   Title  Pt will report urinary frequency from 2x/ every 2 hours to  1x every 2 hours in order to travel with family     Time  10    Period  Weeks    Status  Achieved      PT LONG TERM GOAL #7   Title  Pt will report not multitasking when eating her meals in order to optimize parasympathetic nervous system for proper digestion and GI health    Time  2    Period  Weeks    Status  Achieved      PT LONG TERM GOAL #8   Title  Pt will demo proper technique with coactivation of pelvic floor ,  deep core, and alignment in fitness routines and yoga  in order to minimize risks for injury or relapse of Sx    Time  8    Period  Weeks    Status  Partially Met      PT LONG TERM GOAL  #9   TITLE  Pt will decrease her PFDI score from 40% to < 30% in order to optimize pelvic floor function  (2/1: 39%)      Time  8    Period  Weeks    Status  On-going      PT LONG TERM GOAL  #10   TITLE  Pt will report no B knee pain after ZUMBA classes in order to maintain this type of fitness routine     Time  4    Period  Weeks    Status  On-going            Plan - 02/22/17 1137    Clinical Impression Statement Across the past 12 visits, pt has achieved 7/10 goals with significant improvements on COREFO, NIH-CPSI, and ODI scores.  Pt has had improvements with fecal/ urinary leakage and diarrhea close to 2 months. Pt was having regular bowel movement, better formed stools (Bristol Stool Type 4 instead Type 7) for the past month. Pt has practiced relaxation exercises ( mindfulness and not multitasking while eating) in order to promote better digestion with parasympathetic activation. Pt also has demonstrated resolved pelvic obliquities, decreased scar restrictions over abdomen, decreased pelvic floor tightness and tensions. Pt has achieved proper pelvic floor and deep core mm coordination and strength.  These improvements have improved her intraabdominal pressure system which in turn has also  improved her urinary urgency and leakage. Pt has returned to fitness and social activities. Pt's back pain has also improved but pt will require more skilled PT to progress with strength training to minimize relapse of Sx. Today, pt demo'd improved propioception and alignment with simulated Zumba exercises and use of hand weights following Tx. Pt also showed no pelvic floor tightness and elicited a stronger pelvic floor strength compared to previous sessions.    One concern is that her sinus issues remain a barrier to her progress. Since she started using sinus medicine and inhaler over the past 2 weeks, her diarrhea/ constipation have worsened.  Pt plans to consult with her GI doctor this afternoon.   Pt has been educated on the importance of working with her medical providers related to her medications and her respiratory issues. PT suggested pt be screened for OSA due to her increased risk with asthma, GERD.   Research shows the association between OSA and asthma, GERD, upper respiratory conditions. Pt was provided the following research articles:   Zidan M et al. Overlap of obstructive sleep apnea and bronchial asthma: Effect on asthma control. [place] of Chest Diseases  and Tuberculosis.2015. 64:2. ([id number])    Wilhelm CP et al. Annals of Allergy, Asthma & Immunology.  2015. 115:2 (96-102)   PT plans to communicate with her referring provider about this recommendation. Pt will benefit from an interdisciplinary approach to help her minimize her Sx and achieve her goals.         Rehab Potential  Good    PT Frequency  1x / week    PT Duration  12 weeks    PT Treatment/Interventions  Therapeutic activities;Therapeutic exercise;Functional mobility training;Neuromuscular re-education;Manual techniques;Patient/family education;Moist Heat;Balance training;Stair training;Energy conservation;Taping    Consulted and Agree with Plan of Care  Patient       Patient will benefit from skilled  therapeutic intervention in order to improve the following deficits and impairments:  Improper body mechanics, Decreased range of motion, Decreased activity tolerance, Decreased safety awareness, Decreased strength, Hypermobility, Increased fascial restricitons, Postural dysfunction, Pain, Decreased endurance, Decreased balance, Increased muscle spasms, Decreased mobility, Decreased coordination  Visit Diagnosis: Other lack of coordination  Myalgia  Other symptoms and signs involving the musculoskeletal system     Problem List Patient Active Problem List   Diagnosis Date Noted  . Perennial allergic rhinitis with a nonallergic component 01/31/2017  . Asthmatic bronchitis 01/31/2017  . History of food allergy 01/31/2017  . Chest pain 12/13/2012  . Dyspnea 12/13/2012    Jerl Mina ,PT, DPT, E-RYT  02/22/2017, 11:47 AM  [place] MAIN [facility] SERVICES [address] [place], [place], [address] Phone: [phone]   Fax:  [phone]  Name: Karen [name] MRN: [id number] Date of Birth: 1970-12-18

## 2017-02-27 ENCOUNTER — Ambulatory Visit: Payer: 59 | Admitting: Physical Therapy

## 2017-02-27 DIAGNOSIS — R29898 Other symptoms and signs involving the musculoskeletal system: Secondary | ICD-10-CM | POA: Diagnosis not present

## 2017-02-27 DIAGNOSIS — R278 Other lack of coordination: Secondary | ICD-10-CM

## 2017-02-27 DIAGNOSIS — M791 Myalgia, unspecified site: Secondary | ICD-10-CM

## 2017-02-27 NOTE — Therapy (Signed)
[place] MAIN [facility] SERVICES [address] [place], [place], [address] Phone: [phone]   Fax:  [phone]  Physical Therapy Treatment  Patient Details  Name: NOELA BROTHERS MRN: [id number] Date of Birth: 14-Jul-1970 Referring Provider: Jacquiline Doe, MD    Encounter Date: 02/27/2017  PT End of Session - 02/27/17 1058    Visit Number  13    Date for PT Re-Evaluation  03/10/17    PT Start Time  0905    PT Stop Time  1010    PT Time Calculation (min)  65 min    Activity Tolerance  Patient tolerated treatment well;No increased pain    Behavior During Therapy  WFL for tasks assessed/performed       Past Medical History:  Diagnosis Date  . Adenomyosis   . Arthritis 2013   L knee gets steroid injections   . Asthmatic bronchitis 01/31/2017  . DVT of lower extremity (deep venous thrombosis) ([place])   . Dysmenorrhea   . Endometriosis   . Fibroids   . GERD (gastroesophageal reflux disease)   . Kidney stones   . Menorrhagia   . Migraine   . Recurrent upper respiratory infection (URI)   . Sebaceous cyst of breast, right lower inner quadrant 10/25/2012   Excised 11/21/12   . Syncope, non cardiac     Past Surgical History:  Procedure Laterality Date  . ARTHROSCOPIC HAGLUNDS REPAIR    . BREAST CYST EXCISION Right 11/21/2012   Procedure: CYST EXCISION BREAST;  Surgeon: Haywood Lasso, MD;  Location: [place];  Service: General;  Laterality: Right;  . BREAST EXCISIONAL BIOPSY Right 11/2012  . CESAREAN SECTION    . CHOLECYSTECTOMY    . COLONOSCOPY  05/02/2011   Procedure: COLONOSCOPY;  Surgeon: Winfield Cunas., MD;  Location: [facility];  Service: Endoscopy;  Laterality: N/A;  . colonoscopy  11/04/2014  . ESOPHAGEAL MANOMETRY N/A 11/04/2015   Procedure: ESOPHAGEAL MANOMETRY (EM);  Surgeon: Laurence Spates, MD;  Location: [facility];  Service: Endoscopy;  Laterality: N/A;  . ESOPHAGOGASTRODUODENOSCOPY  05/02/2011   Procedure: ESOPHAGOGASTRODUODENOSCOPY (EGD);  Surgeon: Winfield Cunas., MD;  Location: [facility];  Service: Endoscopy;  Laterality: N/A;  . ESOPHAGOGASTRODUODENOSCOPY N/A 09/01/2014   Procedure: ESOPHAGOGASTRODUODENOSCOPY (EGD);  Surgeon: Laurence Spates, MD;  Location: [facility];  Service: Endoscopy;  Laterality: N/A;  . KNEE SURGERY    . LAPAROSCOPY    . laproscopic abdominal    . [place] IMPEDANCE STUDY N/A 11/04/2015   Procedure: [place] IMPEDANCE STUDY;  Surgeon: Laurence Spates, MD;  Location: [facility];  Service: Endoscopy;  Laterality: N/A;  . VAGINAL HYSTERECTOMY      There were no vitals filed for this visit.  Subjective Assessment - 02/27/17 0913    Subjective  Pt reports her shoulder and hipare bothering her after doing the band exercises.     Pertinent History  C-section, laproscopy 2/2 endometriosis, vaginal hysterectomy,     Patient Stated Goals  being active with spending time with family,. traveling, Zumba class          [facility] PT Assessment - 02/27/17 1012      Palpation   Palpation comment  SIJ R limited in nutation and pain       Ambulation/Gait   Gait Comments  treadmill walking at 1.3 m/s with R hip hike and report of R hip pain  [facility] Adult PT Treatment/Exercise - 02/27/17 1012      Neuro Re-ed    Neuro Re-ed Details   cable column, 4.7 kg waist belt, 5 laps forward/back walking with cues for co-activation of lower kinetic chain.  gait training on treadmill, biomechanics for minimizing knee pain and the shoudler/ hip pain in previous HEP      Manual Therapy   Internal Pelvic Floor  long axis distraction on RLE, PA mob along R SIJ with MWM and superior glide of sacrum to faciliate nutation             PT Education - 02/27/17 1058    Education provided  Yes    Education Details  HEP    Person(s) Educated  Patient    Methods  Explanation;Demonstration;Tactile cues;Verbal cues;Handout    Comprehension  Returned  demonstration;Verbalized understanding;Verbal cues required;Tactile cues required          PT Long Term Goals - 02/22/17 1019      PT LONG TERM GOAL #1   Title  Pt will decrease her ODI score from 32% to < 27% in order to return to ADLs (1/25: 32% ) (2/1: 22%)     Time  8    Period  Weeks    Status  Achieved      PT LONG TERM GOAL #2   Title  Pt will decrease her NIH-CPSI score from 81% in order to resotre pelvic floor function  ( 1/25: 42%, 2/1: 25%  )     Time  12    Period  Weeks    Status  Achieved      PT LONG TERM GOAL #3   Title  Pt will decrease her COREFO score from 30% to < 25% in order to have improved bowel frequency  (2/20: 23%)     Time  12    Period  Weeks    Status  Achieved      PT LONG TERM GOAL #4   Title  Pt will report better formed stools with consistency type (Bristol Stool Scale)  Type 4-5 across 75% of the time in order to be to able to go to social events    Time  12    Period  Weeks    Status  Achieved      PT LONG TERM GOAL #5   Title  Pt will demo decreased abdominal scar restrictions in order to progress to deep core coordination and strengthening    Time  2    Period  Weeks    Status  Achieved      PT LONG TERM GOAL #6   Title  Pt will report urinary frequency from 2x/ every 2 hours to 1x every 2 hours in order to travel with family     Time  10    Period  Weeks    Status  Achieved      PT LONG TERM GOAL #7   Title  Pt will report not multitasking when eating her meals in order to optimize parasympathetic nervous system for proper digestion and GI health    Time  2    Period  Weeks    Status  Achieved      PT LONG TERM GOAL #8   Title  Pt will demo proper technique with coactivation of pelvic floor ,  deep core, and alignment in fitness routines and yoga  in order to minimize risks for injury or relapse of Sx  Time  8    Period  Weeks    Status  Partially Met      PT LONG TERM GOAL  #9   TITLE  Pt will decrease her PFDI score  from 40% to < 30% in order to optimize pelvic floor function  (2/1: 39%)      Time  8    Period  Weeks    Status  On-going      PT LONG TERM GOAL  #10   TITLE  Pt will report no B knee pain after ZUMBA classes in order to maintain this type of fitness routine     Time  4    Period  Weeks    Status  On-going            Plan - 02/27/17 1058    Clinical Impression Statement  Pt is showing improved propioception with trunk dissassociation with pelvis/lower kinetic chain. Pt required manual Tx which she tolerated to improve R SIJ mobility which decreased her R hip pain with treadmill walking. Pt continued to show R hip hiking and will require further assessment at next session in order to help pt progress to more gym activities for aerobic activity. Suspect pt's Hx of L knee surgery may contribute to compensatory patterns along kinetic chain. Pt continues to benefit from skilled PT   Rehab Potential  Good    PT Frequency  1x / week    PT Duration  12 weeks    PT Treatment/Interventions  Therapeutic activities;Therapeutic exercise;Functional mobility training;Neuromuscular re-education;Manual techniques;Patient/family education;Moist Heat;Balance training;Stair training;Energy conservation;Taping    Consulted and Agree with Plan of Care  Patient       Patient will benefit from skilled therapeutic intervention in order to improve the following deficits and impairments:  Improper body mechanics, Decreased range of motion, Decreased activity tolerance, Decreased safety awareness, Decreased strength, Hypermobility, Increased fascial restricitons, Postural dysfunction, Pain, Decreased endurance, Decreased balance, Increased muscle spasms, Decreased mobility, Decreased coordination  Visit Diagnosis: Other lack of coordination  Myalgia  Other symptoms and signs involving the musculoskeletal system     Problem List Patient Active Problem List   Diagnosis Date Noted  . Perennial allergic  rhinitis with a nonallergic component 01/31/2017  . Asthmatic bronchitis 01/31/2017  . History of food allergy 01/31/2017  . Chest pain 12/13/2012  . Dyspnea 12/13/2012    Jerl Mina ,PT, DPT, E-RYT  02/27/2017, 11:00 AM  [place] MAIN [facility] SERVICES [address] [place], [place], [address] Phone: [phone]   Fax:  [phone]  Name: LAYCI STENGLEIN MRN: [id number] Date of Birth: 1970/07/13

## 2017-02-27 NOTE — Patient Instructions (Addendum)
REFINED body awareness in the previous exercise:  Change the band resistance from green to less resistance yellow color band   "pulling the seat belt" Slower and controlled to feel the grounding down of feet and hips to not turn them,  Shoulders blades down and back and elbow straightens all the way with the pulling   Keep shoulders engaged on back on the way return to decrease overuse of upper traps   ______  Sidestepping Feet hip width  ________  Deep core level 1 + quic k squeeze    Deep core level 3 just by itself   _________  Standing posture  : EQUAL WEIGHT ON BOTH LEGS  Shift navel forward slightly, shift weight across heel and ball mound, unlock knees  Neutral spine and pelvic alignment (like zipping a zipper)    Laying on your back:  find pelvic neutral by bridging up first , then lower ribs and lastly, your pelvis

## 2017-03-03 ENCOUNTER — Ambulatory Visit: Payer: 59 | Admitting: Physical Therapy

## 2017-03-06 ENCOUNTER — Ambulatory Visit: Payer: 59 | Attending: Gastroenterology | Admitting: Physical Therapy

## 2017-03-06 DIAGNOSIS — R29898 Other symptoms and signs involving the musculoskeletal system: Secondary | ICD-10-CM | POA: Diagnosis not present

## 2017-03-06 DIAGNOSIS — R278 Other lack of coordination: Secondary | ICD-10-CM | POA: Insufficient documentation

## 2017-03-06 DIAGNOSIS — M791 Myalgia, unspecified site: Secondary | ICD-10-CM | POA: Diagnosis not present

## 2017-03-06 MED FILL — XIIDRA 5% EYE DROPS: 5 | 30 days supply | Qty: 60 | Fill #3

## 2017-03-06 MED FILL — RELISTOR 150 MG TABLET: 150 | 30 days supply | Qty: 60 | Fill #0

## 2017-03-06 NOTE — Therapy (Addendum)
[place] MAIN [facility] SERVICES [address] [place], [place], [address] Phone: [phone]   Fax:  [phone]  Physical Therapy Treatment  Patient Details  Name: Karen [name] MRN: [id number] Date of Birth: 1970/09/13 Referring Provider: Jacquiline Doe, MD    Encounter Date: 03/06/2017  PT End of Session - 03/06/17 0945    Visit Number  14    Date for PT Re-Evaluation  05/29/17    PT Start Time  0904    PT Stop Time  1000    PT Time Calculation (min)  56 min    Activity Tolerance  Patient tolerated treatment well;No increased pain    Behavior During Therapy  WFL for tasks assessed/performed       Past Medical History:  Diagnosis Date  . Adenomyosis   . Arthritis 2013   L knee gets steroid injections   . Asthmatic bronchitis 01/31/2017  . DVT of lower extremity (deep venous thrombosis) ([place])   . Dysmenorrhea   . Endometriosis   . Fibroids   . GERD (gastroesophageal reflux disease)   . Kidney stones   . Menorrhagia   . Migraine   . Recurrent upper respiratory infection (URI)   . Sebaceous cyst of breast, right lower inner quadrant 10/25/2012   Excised 11/21/12   . Syncope, non cardiac     Past Surgical History:  Procedure Laterality Date  . ARTHROSCOPIC HAGLUNDS REPAIR    . BREAST CYST EXCISION Right 11/21/2012   Procedure: CYST EXCISION BREAST;  Surgeon: Haywood Lasso, MD;  Location: [place];  Service: General;  Laterality: Right;  . BREAST EXCISIONAL BIOPSY Right 11/2012  . CESAREAN SECTION    . CHOLECYSTECTOMY    . COLONOSCOPY  05/02/2011   Procedure: COLONOSCOPY;  Surgeon: Winfield Cunas., MD;  Location: [facility];  Service: Endoscopy;  Laterality: N/A;  . colonoscopy  11/04/2014  . ESOPHAGEAL MANOMETRY N/A 11/04/2015   Procedure: ESOPHAGEAL MANOMETRY (EM);  Surgeon: Laurence Spates, MD;  Location[facility];  Service: Endoscopy;  Laterality: N/A;  . ESOPHAGOGASTRODUODENOSCOPY  05/02/2011    Procedure: ESOPHAGOGASTRODUODENOSCOPY (EGD);  Surgeon: Winfield Cunas., MD;  Location: [facility];  Service: Endoscopy;  Laterality: N/A;  . ESOPHAGOGASTRODUODENOSCOPY N/A 09/01/2014   Procedure: ESOPHAGOGASTRODUODENOSCOPY (EGD);  Surgeon: Laurence Spates, MD;  Location: [facility];  Service: Endoscopy;  Laterality: N/A;  . KNEE SURGERY    . LAPAROSCOPY    . laproscopic abdominal    . [place] IMPEDANCE STUDY N/A 11/04/2015   Procedure: [place] IMPEDANCE STUDY;  Surgeon: Laurence Spates, MD;  [facility];  Service: Endoscopy;  Laterality: N/A;  . VAGINAL HYSTERECTOMY      There were no vitals filed for this visit.  Subjective Assessment - 03/06/17 0909    Subjective  Pt reported she attended a stress-reduction seminar which talked same things she has been practicing with PT. "It was right on time." Pt continues to have diarrhea and upset stomach. Pt continues to take her sinus medications. Pt will be talking with her PCP re: sleep study.     Pertinent History  C-section, laproscopy 2/2 endometriosis, vaginal hysterectomy,     Patient Stated Goals  being active with spending time with family,. traveling, Zumba class          [facility] PT Assessment - 03/06/17 0910      Strength   Overall Strength Comments  L hip flexion/ knee ext 4+/5, R 4+/5, prone: hip ext 3/5 L, 3+/5  R    Plantarflexion standing L 20, R 25 reps       Palpation   Palpation comment  SIJ nutation present, no SIJ hypomobility       Ambulation/Gait   Gait Comments  running: 2 min warm up, cool down, peak speed 1.6 mph,  less knee pain with cue for softer landing, increased hip flexion                   [facility] Adult PT Treatment/Exercise - 03/06/17 0910      Neuro Re-ed    Neuro Re-ed Details   see pt instructions, running mechanics              PT Education - 03/06/17 0945    Education provided  Yes    Education Details  HEP    Person(s) Educated  Patient    Methods   Explanation;Demonstration;Tactile cues;Verbal cues;Handout    Comprehension  Returned demonstration;Verbalized understanding          PT Long Term Goals - 03/06/17 0918      PT LONG TERM GOAL #1   Title  Pt will decrease her ODI score from 32% to < 27% in order to return to ADLs (1/25: 32% ) (2/1: 22%)     Time  8    Period  Weeks    Status  Achieved      PT LONG TERM GOAL #2   Title  Pt will decrease her NIH-CPSI score from 81% in order to resotre pelvic floor function  ( 1/25: 42%, 2/1: 25%  )     Time  12    Period  Weeks    Status  Achieved      PT LONG TERM GOAL #3   Title  Pt will decrease her COREFO score from 30% to < 25% in order to have improved bowel frequency  (2/20: 23%)     Time  12    Period  Weeks    Status  Achieved      PT LONG TERM GOAL #4   Title  Pt will report better formed stools with consistency type (Bristol Stool Scale)  Type 4-5 across 75% of the time in order to be to able to go to social events    Time  12    Period  Weeks    Status  Achieved      PT LONG TERM GOAL #5   Title  Pt will demo decreased abdominal scar restrictions in order to progress to deep core coordination and strengthening    Time  2    Period  Weeks    Status  Achieved      Additional Long Term Goals   Additional Long Term Goals  Yes      PT LONG TERM GOAL #6   Title  Pt will report urinary frequency from 2x/ every 2 hours to 1x every 2 hours in order to travel with family     Time  10    Period  Weeks    Status  Achieved      PT LONG TERM GOAL #7   Title  Pt will report not multitasking when eating her meals in order to optimize parasympathetic nervous system for proper digestion and GI health    Time  2    Period  Weeks    Status  Achieved      PT LONG TERM GOAL #8   Title  Pt will demo  proper technique with coactivation of pelvic floor ,  deep core, and alignment in fitness routines and yoga  in order to minimize risks for injury or relapse of Sx    Time  8     Period  Weeks    Status  Partially Met      PT LONG TERM GOAL  #9   TITLE  Pt will decrease her PFDI score from 40% to < 30% in order to optimize pelvic floor function  (2/1: 39%)      Time  8    Period  Weeks    Status  Partially Met      PT LONG TERM GOAL  #10   TITLE  Pt will report no B knee pain after ZUMBA classes in order to maintain this type of fitness routine     Time  4    Period  Weeks    Status  On-going      PT LONG TERM GOAL  #11   TITLE  Pt will be able to demo no gait deviations / no report of B knee pain with running/jogging for 15 min on the treadmill without incline in order to to progress towards running with less risk for injuries     Time  12    Period  Weeks    Status  New    Target Date  05/29/17            Plan - 03/06/17 0921    Clinical Impression Statement  Pt achieved 7/11 goals with improved with significant improvements on COREFO, NIH-CPSI, and ODI scores.  Pt has had improvements with fecal/ urinary leakage and diarrhea close to 2 months. Pt was having regular bowel movement, better formed stools (Bristol Stool Type 4 instead Type 7) for the past month. Pt has practiced relaxation exercises ( mindfulness and not multitasking while eating) in order to promote better digestion with parasympathetic activation. Pt also has demonstrated resolved pelvic obliquities, decreased scar restrictions over abdomen.Pt also showed no pelvic floor tightness and elicited a stronger pelvic floor strength compared to previous sessions.   Pt has achieved proper pelvic floor and deep core mm coordination and strength. These improvements have improved her intraabdominal pressure system which in turn has also improved her urinary urgency and leakage. Pt has returned to fitness and social activities. Pt's back pain has also improved but pt will require more skilled PT to progress with strength training to minimize relapse of Sx. Pt demo'd improved propioception and alignment  with simulated Zumba exercises and use of hand weights  in addition to having no B knee with treadmill walking/light jogging.[place] jogging. Anticipate pt will achieve her remaining fitness-related goals with more skilled PT.   One concern is that her sinus issues remain a barrier to her progress. Since she started using sinus medicine and inhaler over the past 2 weeks, her diarrhea/ constipation have worsened. Pt has been educated on the importance of working with her medical providers related to her medications and her respiratory issues. PT suggested pt be screened for OSA due to her increased risk with asthma, GERD.   Research shows the association between OSA and asthma, GERD, upper respiratory conditions. Pt was provided the following research articles:   Zidan M et al. Overlap of obstructive sleep apnea and bronchial asthma: Effect on asthma control. [place] of Chest Diseases and Tuberculosis.2015. 64:2. (425-430)    Wilhelm CPet al.Annals of Allergy, Asthma & Immunology.2015. 115:2 (96-102)  PT has communicated with her referring provider about this recommendation. Pt will benefit from an interdisciplinary approach to help her minimize her Sx and achieve her goals.    Rehab Potential  Good    PT Frequency  1x / week    PT Duration  12 weeks    PT Treatment/Interventions  Therapeutic activities;Therapeutic exercise;Functional mobility training;Neuromuscular re-education;Manual techniques;Patient/family education;Moist Heat;Balance training;Stair training;Energy conservation;Taping    Consulted and Agree with Plan of Care  Patient       Patient will benefit from skilled therapeutic intervention in order to improve the following deficits and impairments:  Improper body mechanics, Decreased range of motion, Decreased activity tolerance, Decreased safety awareness, Decreased strength, Hypermobility, Increased fascial restricitons, Postural dysfunction, Pain, Decreased  endurance, Decreased balance, Increased muscle spasms, Decreased mobility, Decreased coordination  Visit Diagnosis: Other lack of coordination  Myalgia  Other symptoms and signs involving the musculoskeletal system     Problem List Patient Active Problem List   Diagnosis Date Noted  . Perennial allergic rhinitis with a nonallergic component 01/31/2017  . Asthmatic bronchitis 01/31/2017  . History of food allergy 01/31/2017  . Chest pain 12/13/2012  . Dyspnea 12/13/2012    Jerl Mina ,PT, DPT, E-RYT  03/06/2017, 6:06 PM  [place] MAIN [facility] SERVICES [address] [place], [place], [address] Phone: [phone]   Fax:  [phone]  Name: ISAIAH TOROK MRN: [id number] Date of Birth: [birth year]

## 2017-03-06 NOTE — Patient Instructions (Addendum)
Discontinue the seated band exericise for hip flexion and "gas pedal"/ plantarflexion and ankle eversion.    New:   1) Backward lunges with BLUE band at doorknob  2 mins . Elbows by ribs, shoulders down and back  Front knee in place above ankle, back foot and toes pointed forward, ( do not turn the hips and toes out) . Heel is up to be able to push off and return R foot next to the L at hip width apart  Carry your center with you as you step to maintain 50% weight in both legs.     2) BLUE Band at waist connected to doorknob 65mins Stepping forward normal length steps, planting mid and forefoot down, center of mass ( navel) leans forward slightly as if you were walking uphill 3-4 steps till band feels taut ( MAKE SURE THE DOOR IS LOCKED AND WON'T OPEN)   Stepping backwards, lower heel slowly, carry trunk and hips back as you step    3) walking front lunges with proper alignment 4 laps

## 2017-03-10 DIAGNOSIS — J4520 Mild intermittent asthma, uncomplicated: Secondary | ICD-10-CM | POA: Diagnosis not present

## 2017-03-10 DIAGNOSIS — R400 Somnolence: Secondary | ICD-10-CM | POA: Diagnosis not present

## 2017-03-14 DIAGNOSIS — H524 Presbyopia: Secondary | ICD-10-CM | POA: Diagnosis not present

## 2017-03-14 DIAGNOSIS — H52221 Regular astigmatism, right eye: Secondary | ICD-10-CM | POA: Diagnosis not present

## 2017-03-14 DIAGNOSIS — H5213 Myopia, bilateral: Secondary | ICD-10-CM | POA: Diagnosis not present

## 2017-03-20 ENCOUNTER — Ambulatory Visit: Payer: 59 | Admitting: Physical Therapy

## 2017-03-20 DIAGNOSIS — R29898 Other symptoms and signs involving the musculoskeletal system: Secondary | ICD-10-CM

## 2017-03-20 DIAGNOSIS — M791 Myalgia, unspecified site: Secondary | ICD-10-CM | POA: Diagnosis not present

## 2017-03-20 DIAGNOSIS — R278 Other lack of coordination: Secondary | ICD-10-CM | POA: Diagnosis not present

## 2017-03-20 NOTE — Patient Instructions (Addendum)
Warrior I : Feet are hip width apart, one behind like you are on ski tracks, front knee bent over ankle but not more forward then the ankle.  Make sure 50% weight is in the front foot/leg , 50% weight is the back foot/ leg   Arms up like "V" , drop shoulders down  Palms together at the hears  3 breaths here.   Warrior II:  Raise arm up in front on the same side as your front knee, back arm up behind. Arms are aligned with the length of the mat Inhale,  Exhale and relax shoulders and ribcage  Make sure 50% weight is in the front foot/leg , 50% weight is the back foot/ leg   3 breaths here.   Extended side angle :  Feet are hip width apart, L foot one behind like you are on ski tracks,  R knee bent over ankle but not more forward then the ankle.  Make sure 50% weight is in the front foot/leg , 50% weight is the back foot/ leg    Rest R forearm lightly on top of thigh,  L hand on L shoulder, roll elbow back  Inhale lengthen spine,   Exhale turn navel to the L then the ribcage turns, look at the other wall.  Keep maintaining  50% weight is in the front foot/leg , 50% weight is the back foot/ leg  And make sure the front knee is still pointed in the toe line of the 2nd toe.   3 breaths here.   ________ Discontinue exercises while you are holding band ( seat belt, multifidis, band at ankle, foot drag, zip the zipper wall squat )    Prepping for half kneeling transitions to floor ( see videos for cues and alignment)  Add mini standing lunges with hand on dresser or table  , keep back knee unlocked     Transition from standing to floor: Wide squat like you are about to pick something up from the floor --> hands on the ground (all fours)  To get up, all fours--> lifts hips in to Downward Facing Dog  and walk hands backwards to feet --> mini quat --> hands on hips -->  knees glide forward and roll  Hips up instead of hinging spine up

## 2017-03-21 NOTE — Therapy (Signed)
[place] MAIN [facility] SERVICES [address] [employer], [place], [address] Phone: [phone]   Fax:  [phone]  Physical Therapy Treatment  Patient Details  Name: Karen [name] MRN: [id number] Date of Birth: Sep 18, 1970 Referring Provider: Jacquiline Doe, MD    Encounter Date: 03/20/2017    Past Medical History:  Diagnosis Date  . Adenomyosis   . Arthritis 2013   L knee gets steroid injections   . Asthmatic bronchitis 01/31/2017  . DVT of lower extremity (deep venous thrombosis) ([place])   . Dysmenorrhea   . Endometriosis   . Fibroids   . GERD (gastroesophageal reflux disease)   . Kidney stones   . Menorrhagia   . Migraine   . Recurrent upper respiratory infection (URI)   . Sebaceous cyst of breast, right lower inner quadrant 10/25/2012   Excised 11/21/12   . Syncope, non cardiac     Past Surgical History:  Procedure Laterality Date  . ARTHROSCOPIC HAGLUNDS REPAIR    . BREAST CYST EXCISION Right 11/21/2012   Procedure: CYST EXCISION BREAST;  Surgeon: Haywood Lasso, MD;  Location: [place];  Service: General;  Laterality: Right;  . BREAST EXCISIONAL BIOPSY Right 11/2012  . CESAREAN SECTION    . CHOLECYSTECTOMY    . COLONOSCOPY  05/02/2011   Procedure: COLONOSCOPY;  Surgeon: Winfield Cunas., MD;  Location: [facility];  Service: Endoscopy;  Laterality: N/A;  . colonoscopy  11/04/2014  . ESOPHAGEAL MANOMETRY N/A 11/04/2015   Procedure: ESOPHAGEAL MANOMETRY (EM);  Surgeon: Laurence Spates, MD;  Location: [facility];  Service: Endoscopy;  Laterality: N/A;  . ESOPHAGOGASTRODUODENOSCOPY  05/02/2011   Procedure: ESOPHAGOGASTRODUODENOSCOPY (EGD);  Surgeon: Winfield Cunas., MD;  Location: [facility];  Service: Endoscopy;  Laterality: N/A;  . ESOPHAGOGASTRODUODENOSCOPY N/A 09/01/2014   Procedure: ESOPHAGOGASTRODUODENOSCOPY (EGD);  Surgeon: Laurence Spates, MD;  Location: [facility];  Service: Endoscopy;   Laterality: N/A;  . KNEE SURGERY    . LAPAROSCOPY    . laproscopic abdominal    . [place] IMPEDANCE STUDY N/A 11/04/2015   Procedure: [place] IMPEDANCE STUDY;  Surgeon: Laurence Spates, MD;  Location: [facility];  Service: Endoscopy;  Laterality: N/A;  . VAGINAL HYSTERECTOMY      There were no vitals filed for this visit.  Subjective Assessment - 03/21/17 2207    Subjective  Pt reported going to [place] is going really well with minimal knee pain. Pt is concerned with her L localized elbow pain has been happening for 3 weeks. Pt notices her bloating problems are worst when wearing tight clothing. Pt is still dealing with her sinus and diarrhea problems. Pt continues to take her sinus mediactions which has had effect on her diarrhea.   Pt is waiting on the referral to set up the sleep study appt.      Pertinent History  C-section, laproscopy 2/2 endometriosis, vaginal hysterectomy,     Patient Stated Goals  being active with spending time with family,. traveling, Zumba class          [facility] PT Assessment - 03/21/17 2207      Floor to Stand   Comments  poor alignment of foot and knee, anterior COM  with half kneeling , requiring single UE.   difficulty with downward facing dog transition        Other:   Other/ Comments  warrior pose propioception with moderate cue for postural stability  [facility] PT Treatment/Exercise - 03/21/17 2209      Neuro Re-ed    Neuro Re-ed Details   see pt instructions, running mechanics              PT Education - 03/21/17 2209    Education provided  Yes    Education Details  HEP    Person(s) Educated  Patient    Methods  Explanation;Demonstration;Tactile cues;Verbal cues;Handout    Comprehension  Returned demonstration;Verbalized understanding;Verbal cues required;Tactile cues required          PT Long Term Goals - 03/20/17 0913      PT LONG TERM GOAL #1   Title  Pt will decrease her ODI score from 32% to < 27% in order  to return to ADLs (1/25: 32% ) (2/1: 22%)     Time  8    Period  Weeks    Status  Achieved      PT LONG TERM GOAL #2   Title  Pt will decrease her NIH-CPSI score from 81% in order to resotre pelvic floor function  ( 1/25: 42%, 2/1: 25%  )     Time  12    Period  Weeks    Status  Achieved      PT LONG TERM GOAL #3   Title  Pt will decrease her COREFO score from 30% to < 25% in order to have improved bowel frequency  (2/20: 23%)     Time  12    Period  Weeks    Status  Achieved      PT LONG TERM GOAL #4   Title  Pt will report better formed stools with consistency type (Bristol Stool Scale)  Type 4-5 across 75% of the time in order to be to able to go to social events    Time  12    Period  Weeks    Status  Achieved      PT LONG TERM GOAL #5   Title  Pt will demo decreased abdominal scar restrictions in order to progress to deep core coordination and strengthening    Time  2    Period  Weeks    Status  Achieved      PT LONG TERM GOAL #6   Title  Pt will report urinary frequency from 2x/ every 2 hours to 1x every 2 hours in order to travel with family     Time  10    Period  Weeks    Status  Achieved      PT LONG TERM GOAL #7   Title  Pt will report not multitasking when eating her meals in order to optimize parasympathetic nervous system for proper digestion and GI health    Time  2    Period  Weeks    Status  Achieved      PT LONG TERM GOAL #8   Title  Pt will demo proper technique with coactivation of pelvic floor ,  deep core, and alignment in fitness routines and yoga  in order to minimize risks for injury or relapse of Sx    Time  8    Period  Weeks    Status  Partially Met      PT LONG TERM GOAL  #9   TITLE  Pt will decrease her PFDI score from 40% to < 30% in order to optimize pelvic floor function  (2/1: 39%, 3/18: 37% )      Time  8    Period  Weeks    Status  Partially Met      PT LONG TERM GOAL  #10   TITLE  Pt will report no B knee pain after ZUMBA  classes in order to maintain this type of fitness routine     Time  4    Period  Weeks    Status  Achieved      PT LONG TERM GOAL  #11   TITLE  Pt will be able to demo no gait deviations / no report of B knee pain with running/jogging for 15 min on the treadmill without incline in order to to progress towards running with less risk for injuries     Time  12    Period  Weeks    Status  Partially Met            Plan - 03/21/17 2209    Clinical Impression Statement  Pt progressed well with alignment and propioception training for common yoga poses and floor<> stand t/f to minimzie injuries. Pt was educated on maintaining her fitness routine with Zumba and yoga but not start swimming classes yet in order to gain more mastery of these two types of fitness classes and not to get overwhelmed with packing her schedule which can lead to the stress which she has managed better since POC. Pt voiced understanding. Pt inquired about whether wearing tight clothing is associated with her GI symptoms. PT assessed for diastasis recti which pt did not have and was explained tight clothing is not likely contributing to her GI issues.  Pt is waiting on the sleep clinic to call her back for a sleep study. Pt continues to have GI issues as an side effect to her sinus medications.  Pt continues to benefit from skilled PT to integrate into fitness.       Rehab Potential  Good    PT Frequency  1x / week    PT Duration  12 weeks    PT Treatment/Interventions  Therapeutic activities;Therapeutic exercise;Functional mobility training;Neuromuscular re-education;Manual techniques;Patient/family education;Moist Heat;Balance training;Stair training;Energy conservation;Taping    Consulted and Agree with Plan of Care  Patient       Patient will benefit from skilled therapeutic intervention in order to improve the following deficits and impairments:  Improper body mechanics, Decreased range of motion, Decreased activity  tolerance, Decreased safety awareness, Decreased strength, Hypermobility, Increased fascial restricitons, Postural dysfunction, Pain, Decreased endurance, Decreased balance, Increased muscle spasms, Decreased mobility, Decreased coordination  Visit Diagnosis: Other lack of coordination  Myalgia  Other symptoms and signs involving the musculoskeletal system     Problem List Patient Active Problem List   Diagnosis Date Noted  . Perennial allergic rhinitis with a nonallergic component 01/31/2017  . Asthmatic bronchitis 01/31/2017  . History of food allergy 01/31/2017  . Chest pain 12/13/2012  . Dyspnea 12/13/2012    Jerl Mina ,PT, DPT, E-RYT  03/21/2017, 10:13 PM  [place] MAIN [facility] SERVICES [address] [place], [place], [address] Phone: [phone]   Fax:  [phone]  Name: Karen [name] MRN: [id number] Date of Birth: [birth year]

## 2017-03-23 ENCOUNTER — Encounter: Payer: Self-pay | Admitting: Neurology

## 2017-03-23 ENCOUNTER — Ambulatory Visit: Payer: 59 | Admitting: Neurology

## 2017-03-23 VITALS — BP 114/71 | HR 75 | Ht 67.5 in | Wt 200.5 lb

## 2017-03-23 DIAGNOSIS — G4719 Other hypersomnia: Secondary | ICD-10-CM

## 2017-03-23 DIAGNOSIS — R351 Nocturia: Secondary | ICD-10-CM

## 2017-03-23 DIAGNOSIS — J449 Chronic obstructive pulmonary disease, unspecified: Secondary | ICD-10-CM

## 2017-03-23 DIAGNOSIS — R51 Headache: Secondary | ICD-10-CM

## 2017-03-23 DIAGNOSIS — E669 Obesity, unspecified: Secondary | ICD-10-CM

## 2017-03-23 DIAGNOSIS — J4489 Other specified chronic obstructive pulmonary disease: Secondary | ICD-10-CM

## 2017-03-23 DIAGNOSIS — R0681 Apnea, not elsewhere classified: Secondary | ICD-10-CM | POA: Diagnosis not present

## 2017-03-23 DIAGNOSIS — R519 Headache, unspecified: Secondary | ICD-10-CM

## 2017-03-23 DIAGNOSIS — R0683 Snoring: Secondary | ICD-10-CM

## 2017-03-23 NOTE — Progress Notes (Signed)
Subjective:    Patient ID: Karen [name] is a 47 y.o. female.  HPI     Star Age, MD, PhD [facility] [address], [address] [place], [place] [address]  Dear Dr. Fara Olden,   I saw your patient, Karen [name], upon your kind request in my neurologic clinic today for initial consultation of her sleep disorder, in particular, concern for underlying obstructive sleep apnea. The patient is unaccompanied today. As you know, Karen [name] is a 47 year old right-handed woman with an underlying medical history of mild intermittent asthma, irritable bowel syndrome, food intolerances, allergic rhinitis, interstitial cystitis, history of DVT in 1992, reflux disease, migraine headaches, history of vertigo, kidney stones, and borderline obesity, who reports snoring and excessive daytime somnolence as well as apneic pauses while asleep per husband's report. I reviewed your office note from 03/10/2017, which you kindly included. Her Epworth sleepiness score is 11 out of 24 today, fatigue score is 38/63. She has woken up with a sense of choking. She has had recurrent respiratory infections. She works as a Armed forces operational officer. She lives at home with her husband and 2 children. She is a nonsmoker and drinks alcohol rarely, maybe once a year or so. Caffeine several times a week but not necessarily daily. Had recent GI issues including irritable bowel syndrome, food intolerance, for the past year or 2. She has lost about 50 pounds. She also started having recurrent respiratory infections, allergy flareups. She has seen an allergy specialist for this. She was started on new medications. She has been doing physical therapy for pelvic floor exercises. Bedtime is around 11 PM and she does not have significant difficulty falling asleep. She feels exhausted. Rise time is around 5 AM. She has nocturia every night. She has woken up with a headache. She has pauses in her breathing. She denies  telltale symptoms of restless leg syndrome.  Her Past Medical History Is Significant For: Past Medical History:  Diagnosis Date  . Adenomyosis   . Arthritis 2013   L knee gets steroid injections   . Asthmatic bronchitis 01/31/2017  . DVT of lower extremity (deep venous thrombosis) ([place])   . Dysmenorrhea   . Endometriosis   . Fibroids   . GERD (gastroesophageal reflux disease)   . Kidney stones   . Menorrhagia   . Migraine   . Recurrent upper respiratory infection (URI)   . Sebaceous cyst of breast, right lower inner quadrant 10/25/2012   Excised 11/21/12   . Syncope, non cardiac     Her Past Surgical History Is Significant For: Past Surgical History:  Procedure Laterality Date  . ARTHROSCOPIC HAGLUNDS REPAIR    . BREAST CYST EXCISION Right 11/21/2012   Procedure: CYST EXCISION BREAST;  Surgeon: Haywood Lasso, MD;  Location: [facility];  Service: General;  Laterality: Right;  . BREAST EXCISIONAL BIOPSY Right 11/2012  . CESAREAN SECTION    . CHOLECYSTECTOMY    . COLONOSCOPY  05/02/2011   Procedure: COLONOSCOPY;  Surgeon: Winfield Cunas., MD;  Location: [facility];  Service: Endoscopy;  Laterality: N/A;  . colonoscopy  11/04/2014  . ESOPHAGEAL MANOMETRY N/A 11/04/2015   Procedure: ESOPHAGEAL MANOMETRY (EM);  Surgeon: Laurence Spates, MD;  [facility];  Service: Endoscopy;  Laterality: N/A;  . ESOPHAGOGASTRODUODENOSCOPY  05/02/2011   Procedure: ESOPHAGOGASTRODUODENOSCOPY (EGD);  Surgeon: Winfield Cunas., MD;  Location: [facility];  Service: Endoscopy;  Laterality: N/A;  . ESOPHAGOGASTRODUODENOSCOPY N/A 09/01/2014   Procedure:  ESOPHAGOGASTRODUODENOSCOPY (EGD);  Surgeon: Laurence Spates, MD;  Location: [facility];  Service: Endoscopy;  Laterality: N/A;  . KNEE SURGERY    . LAPAROSCOPY    . laproscopic abdominal    . [place] IMPEDANCE STUDY N/A 11/04/2015   Procedure: [place] IMPEDANCE STUDY;  Surgeon: Laurence Spates, MD;  Location: [facility];   Service: Endoscopy;  Laterality: N/A;  . VAGINAL HYSTERECTOMY      Her Family History Is Significant For: Family History  Problem Relation Age of Onset  . Colon cancer Father   . Hypertension Father   . Stroke Father   . Heart disease Father   . Heart attack Father   . Colon cancer Mother   . Diabetes Maternal Uncle   . Stroke Paternal Grandmother   . Heart attack Paternal Grandmother   . Heart attack Maternal Grandmother   . Heart attack Maternal Grandfather   . Cancer Maternal Uncle   . Breast cancer Neg Hx     Her Social History Is Significant For: Social History   Socioeconomic History  . Marital status: Married    Spouse name: Not on file  . Number of children: Not on file  . Years of education: Not on file  . Highest education level: Not on file  Occupational History  . Not on file  Social Needs  . Financial resource strain: Not on file  . Food insecurity:    Worry: Not on file    Inability: Not on file  . Transportation needs:    Medical: Not on file    Non-medical: Not on file  Tobacco Use  . Smoking status: Never Smoker  . Smokeless tobacco: Never Used  Substance and Sexual Activity  . Alcohol use: Yes    Comment: occasionally  . Drug use: No  . Sexual activity: Not on file    Comment: married   Lifestyle  . Physical activity:    Days per week: Not on file    Minutes per session: Not on file  . Stress: Not on file  Relationships  . Social connections:    Talks on phone: Not on file    Gets together: Not on file    Attends religious service: Not on file    Active member of club or organization: Not on file    Attends meetings of clubs or organizations: Not on file    Relationship status: Not on file  Other Topics Concern  . Not on file  Social History Narrative  . Not on file    Her Allergies Are:  No Known Allergies:   Her Current Medications Are:  Outpatient Encounter Medications as of 03/23/2017  Medication Sig  . albuterol (PROAIR  HFA) 108 (90 Base) MCG/ACT inhaler Inhale 2 puffs into the lungs every 4 (four) hours as needed for wheezing or shortness of breath.  Marland Kitchen azelastine (ASTELIN) 0.1 % nasal spray Place 2 sprays into both nostrils 2 (two) times daily.  . calcium-vitamin D 250-100 MG-UNIT per tablet Take 1 tablet by mouth daily.   . Carbinoxamine Maleate (RYVENT) 6 MG TABS Take 6 mg by mouth every 6 (six) hours as needed (every 6-8 hours as needed).  . clindamycin (CLINDAGEL) 1 % gel Apply topically 2 (two) times daily.  . Dapsone (ACZONE) 7.5 % GEL Apply topically.  Marland Kitchen dexlansoprazole (DEXILANT) 60 MG capsule Take 60 mg by mouth daily.  Marland Kitchen ibuprofen (ADVIL,MOTRIN) 200 MG tablet Take 200 mg by mouth every 6 (six) hours as needed for moderate  pain.   . Lifitegrast (XIIDRA) 5 % SOLN Apply to eye.  . Methylnaltrexone Bromide (RELISTOR) 150 MG TABS Take 2 tablets by mouth 1 day or 1 dose.  . Multiple Vitamins-Minerals (MULTIVITAMIN & MINERAL PO) Take 1 tablet by mouth daily.   . ondansetron (ZOFRAN-ODT) 8 MG disintegrating tablet Take 8 mg by mouth every 8 (eight) hours as needed for nausea or vomiting.  . Probiotic Product (PROBIOTIC PO) Take 1 capsule by mouth 4 (four) times daily as needed (for GI upset).  . vitamin B-12 (CYANOCOBALAMIN) 1000 MCG tablet Take 1,000 mcg by mouth daily.  . [DISCONTINUED] azelastine (ASTELIN) 0.1 % nasal spray Place 2 sprays into both nostrils 2 (two) times daily.  . [DISCONTINUED] Azelastine-Fluticasone 137-50 MCG/ACT SUSP Place 1 spray into the nose daily.  . [DISCONTINUED] fluticasone (FLOVENT HFA) 220 MCG/ACT inhaler Take 2 puffs 2 times daily with spacer  . [DISCONTINUED] levocetirizine (XYZAL) 5 MG tablet Take 5 mg by mouth every evening.   No facility-administered encounter medications on file as of 03/23/2017.   :  Review of Systems:  Out of a complete 14 point review of systems, all are reviewed and negative with the exception of these symptoms as listed below: Review of Systems   Neurological:       Epworth Sleepiness Scale 0= would never doze 1= slight chance of dozing 2= moderate chance of dozing 3= high chance of dozing  Sitting and reading:2 Watching TV:2 Sitting inactive in a public place (ex. Theater or meeting):1 As a passenger in a car for an hour without a break:1 Lying down to rest in the afternoon: 3 Sitting and talking to someone:1 Sitting quietly after lunch (no alcohol):1 In a car, while stopped in traffic:0 Total:11     Objective:  Neurological Exam  Physical Exam Physical Examination:   Vitals:   03/23/17 1125  BP: 114/71  Pulse: 75    General Examination: The patient is a very pleasant 47 y.o. female in no acute distress. She appears well-developed and well-nourished and well groomed.   HEENT: Normocephalic, atraumatic, pupils are equal, round and reactive to light and accommodation. Funduscopic exam is normal with sharp disc margins noted. Extraocular tracking is good without limitation to gaze excursion or nystagmus noted. Normal smooth pursuit is noted. Hearing is grossly intact. Tympanic membranes are clear bilaterally. Face is symmetric with normal facial animation and normal facial sensation. Speech is clear, slightly raspy, with no dysarthria noted. There is no hypophonia. There is no lip, neck/head, jaw or voice tremor. Neck is supple with full range of passive and active motion. There are no carotid bruits on auscultation. Oropharynx exam reveals: mild mouth dryness, adequate dental hygiene and mild airway crowding, due to smaller airway entry, redundant soft palate, uvula elongated with a wispy and. Tonsils are about 1+ bilaterally. Mallampati is class I. Neck circumference is 15-1/2 inches. She has a mild overbite.  Chest: Clear to auscultation without wheezing, rhonchi or crackles noted.  Heart: S1+S2+0, regular and normal without murmurs, rubs or gallops noted.   Abdomen: Soft, non-tender and non-distended with normal  bowel sounds appreciated on auscultation.  Extremities: There is no pitting edema in the distal lower extremities bilaterally. Left ankle and little wider than right.  Skin: Warm and dry without trophic changes noted.  Musculoskeletal: exam reveals no obvious joint deformities, tenderness or joint swelling or erythema.   Neurologically:  Mental status: The patient is awake, alert and oriented in all 4 spheres. Her immediate and remote memory,  attention, language skills and fund of knowledge are appropriate. There is no evidence of aphasia, agnosia, apraxia or anomia. Speech is clear with normal prosody and enunciation. Thought process is linear. Mood is normal and affect is normal.  Cranial nerves II - XII are as described above under HEENT exam. In addition: shoulder shrug is normal with equal shoulder height noted. Motor exam: Normal bulk, strength and tone is noted. There is no drift, tremor or rebound. Fine motor skills and coordination: grossly intact with normal finger taps, normal hand movements, normal rapid alternating patting, normal foot taps and normal foot agility.  Cerebellar testing: No dysmetria or intention tremor. There is no truncal or gait ataxia.  Sensory exam: intact to light touch in the upper and lower extremities.  Gait, station and balance: She stands easily. No veering to one side is noted. No leaning to one side is noted. Posture is age-appropriate and stance is narrow based. Gait shows normal stride length and normal pace. No problems turning are noted.    Assessment and Plan:  In summary, Karen [name] is a very pleasant 47 y.o.-year old female with an underlying medical history of mild intermittent asthma, irritable bowel syndrome, food intolerances, allergic rhinitis, interstitial cystitis, history of DVT in 1992, reflux disease, migraine headaches, history of vertigo, kidney stones, and borderline obesity, whose history and physical exam are concerning for  obstructive sleep apnea (OSA). I had a long chat with the patient about my findings and the diagnosis of OSA, its prognosis and treatment options. We talked about medical treatments, surgical interventions and non-pharmacological approaches. I explained in particular the risks and ramifications of untreated moderate to severe OSA, especially with respect to developing cardiovascular disease down the Road, including congestive heart failure, difficult to treat hypertension, cardiac arrhythmias, or stroke. Even type 2 diabetes has, in part, been linked to untreated OSA. Symptoms of untreated OSA include daytime sleepiness, memory problems, mood irritability and mood disorder such as depression and anxiety, lack of energy, as well as recurrent headaches, especially morning headaches. We talked about trying to maintain a healthy lifestyle in general, as well as the importance of weight control. I encouraged the patient to eat healthy, exercise daily and keep well hydrated, to keep a scheduled bedtime and wake time routine, to not skip any meals and eat healthy snacks in between meals. I advised the patient not to drive when feeling sleepy. I recommended the following at this time: sleep study with potential positive airway pressure titration. (We will score hypopneas at 3%).   I explained the sleep test procedure to the patient and also outlined possible surgical and non-surgical treatment options of OSA, including the use of a custom-made dental device (which would require a referral to a specialist dentist or oral surgeon), upper airway surgical options, such as pillar implants, radiofrequency surgery, tongue base surgery, and UPPP (which would involve a referral to an ENT surgeon). Rarely, jaw surgery such as mandibular advancement may be considered.  I also explained the CPAP treatment option to the patient, who indicated that she would be willing to try CPAP if the need arises. I explained the importance of  being compliant with PAP treatment, not only for insurance purposes but primarily to improve Her symptoms, and for the patient's long term health benefit, including to reduce Her cardiovascular risks. I answered all her questions today and the patient was in agreement. I would like to see her back after the sleep study is completed and encouraged her  to call with any interim questions, concerns, problems or updates.   Thank you very much for allowing me to participate in the care of this nice patient. If I can be of any further assistance to you please do not hesitate to call me at [phone].  Sincerely,   Star Age, MD, PhD

## 2017-03-23 NOTE — Patient Instructions (Signed)

## 2017-04-03 ENCOUNTER — Ambulatory Visit: Payer: 59 | Attending: Gastroenterology | Admitting: Physical Therapy

## 2017-04-03 DIAGNOSIS — M25632 Stiffness of left wrist, not elsewhere classified: Secondary | ICD-10-CM | POA: Diagnosis not present

## 2017-04-03 DIAGNOSIS — M6281 Muscle weakness (generalized): Secondary | ICD-10-CM | POA: Diagnosis not present

## 2017-04-03 DIAGNOSIS — M791 Myalgia, unspecified site: Secondary | ICD-10-CM | POA: Diagnosis not present

## 2017-04-03 DIAGNOSIS — R29898 Other symptoms and signs involving the musculoskeletal system: Secondary | ICD-10-CM | POA: Insufficient documentation

## 2017-04-03 DIAGNOSIS — R278 Other lack of coordination: Secondary | ICD-10-CM | POA: Diagnosis not present

## 2017-04-03 DIAGNOSIS — M25522 Pain in left elbow: Secondary | ICD-10-CM | POA: Insufficient documentation

## 2017-04-03 NOTE — Patient Instructions (Addendum)
PELVIC FLOOR / KEGEL EXERCISES   Pelvic floor/ Kegel exercises are used to strengthen the muscles in the base of your pelvis that are responsible for supporting your pelvic organs and preventing urine/feces leakage. Based on your therapist's recommendations, they can be performed while standing, sitting, or lying down. Imagine pelvic floor area as a diamond with pelvic landmarks: top =pubic bone, bottom tip=tailbone, sides=sitting bones (ischial tuberosities).    Make yourself aware of this muscle group by using these cues while coordinating your breath:  Inhale, feel pelvic floor diamond area lower like hammock towards your feet and ribcage/belly expanding. Pause. Let the exhale naturally and feel your belly sink, abdominal muscles hugging in around you and you may notice the pelvic diamond draws upward towards your head forming a umbrella shape. Give a squeeze during the exhalation like you are stopping the flow of urine. If you are squeezing the buttock muscles, try to give 50% less effort.   Common Errors:  Breath holding: If you are holding your breath, you may be bearing down against your bladder instead of pulling it up. If you belly bulges up while you are squeezing, you are holding your breath. Be sure to breathe gently in and out while exercising. Counting out loud may help you avoid holding your breath.  Accessory muscle use: You should not see or feel other muscle movement when performing pelvic floor exercises. When done properly, no one can tell that you are performing the exercises. Keep the buttocks, belly and inner thighs relaxed.  Overdoing it: Your muscles can fatigue and stop working for you if you over-exercise. You may actually leak more or feel soreness at the lower abdomen or rectum.  YOUR HOME EXERCISE PROGRAM  LONG HOLDS:  DON NOT DO WITH QUICKS BACK TO BACK.  SPREAD OUT SETS THROUGHOUT THE DAY   Position: on back     Inhale and then exhale. Then squeeze the  muscle and count aloud for 5 seconds. Rest with three long breaths. (Be sure to let belly sink in with exhales and not push outward)  Perform 5 repetitions, 3 times/day  ( after one month, transition to seated 3 reps, 3 sec x 3 x day  )   SHORT HOLDS: Position: on sitting    Inhale and then exhale. Then squeeze the muscle.  (Be sure to let belly sink in with exhales and not push outward)  Perform 5 repetitions,  Mid morning, mid afternoon, dinner  (3 x)   Times/day                      DECREASE DOWNWARD PRESSURE ON  YOUR PELVIC FLOOR, ABDOMINAL, LOW BACK MUSCLES       PRESERVE YOUR PELVIC HEALTH LONG-TERM   ** SQUEEZE pelvic floor BEFORE YOUR SNEEZE, COUGH, LAUGH   ** EXHALE BEFORE YOU RISE AGAINST GRAVITY (lifting, sit to stand, from squat to stand)   ** LOG ROLL OUT OF BED INSTEAD OF CRUNCH/SIT-UP  _______  Started with * ones!   This first one is made by a pelvic physical therapist and the second one is by a yoga teacher I learned from at [place].   *https://manning.com/  *http://healingmoves.com/healingmovesproducts/relax-into-yoga/    For future reference:  http://physioyoga.ca/products  www.lifeisnow.ca  Slower paced yoga https://jbrownyogavideo.com/practice/dvd-downloads

## 2017-04-04 MED FILL — RELISTOR 150 MG TABLET: 150 | 30 days supply | Qty: 60 | Fill #1

## 2017-04-05 NOTE — Therapy (Signed)
[place] MAIN [facility] SERVICES [address] [place], [place], [address] Phone: [phone]   Fax:  [phone]  Physical Therapy Treatment / Discharge Note  Patient Details  Name: SHONTA BOURQUE MRN: [id number] Date of Birth: 1970-06-01 Referring Provider: Jacquiline Doe, MD    Encounter Date: 04/03/2017    Past Medical History:  Diagnosis Date  . Adenomyosis   . Arthritis 2013   L knee gets steroid injections   . Asthmatic bronchitis 01/31/2017  . DVT of lower extremity (deep venous thrombosis) ([place])   . Dysmenorrhea   . Endometriosis   . Fibroids   . GERD (gastroesophageal reflux disease)   . Kidney stones   . Menorrhagia   . Migraine   . Recurrent upper respiratory infection (URI)   . Sebaceous cyst of breast, right lower inner quadrant 10/25/2012   Excised 11/21/12   . Syncope, non cardiac     Past Surgical History:  Procedure Laterality Date  . ARTHROSCOPIC HAGLUNDS REPAIR    . BREAST CYST EXCISION Right 11/21/2012   Procedure: CYST EXCISION BREAST;  Surgeon: Haywood Lasso, MD;  Location: [place];  Service: General;  Laterality: Right;  . BREAST EXCISIONAL BIOPSY Right 11/2012  . CESAREAN SECTION    . CHOLECYSTECTOMY    . COLONOSCOPY  05/02/2011   Procedure: COLONOSCOPY;  Surgeon: Winfield Cunas., MD;  Location: [facility];  Service: Endoscopy;  Laterality: N/A;  . colonoscopy  11/04/2014  . ESOPHAGEAL MANOMETRY N/A 11/04/2015   Procedure: ESOPHAGEAL MANOMETRY (EM);  Surgeon: Laurence Spates, MD;  Location: [facility];  Service: Endoscopy;  Laterality: N/A;  . ESOPHAGOGASTRODUODENOSCOPY  05/02/2011   Procedure: ESOPHAGOGASTRODUODENOSCOPY (EGD);  Surgeon: Winfield Cunas., MD;  Location: [facility];  Service: Endoscopy;  Laterality: N/A;  . ESOPHAGOGASTRODUODENOSCOPY N/A 09/01/2014   Procedure: ESOPHAGOGASTRODUODENOSCOPY (EGD);  Surgeon: Laurence Spates, MD;  Location: [facility];  Service:  Endoscopy;  Laterality: N/A;  . KNEE SURGERY    . LAPAROSCOPY    . laproscopic abdominal    . [place] IMPEDANCE STUDY N/A 11/04/2015   Procedure: [place] IMPEDANCE STUDY;  Surgeon: Laurence Spates, MD;  Location: [facility];  Service: Endoscopy;  Laterality: N/A;  . VAGINAL HYSTERECTOMY      There were no vitals filed for this visit.  Subjective Assessment - 04/05/17 1330    Subjective  Pt noticed she had more leakage after taking a car trip out of town. Pt did not go to yoga classes because her L arm/ elbow has been bothering her for the past 2 weeks. Denied radiating pain. Pt will be seeing her PCP about getting Tx for her elbow pain. Pt will be set up with her sleep study on 04/27/17.       Pertinent History  C-section, laproscopy 2/2 endometriosis, vaginal hysterectomy,     Patient Stated Goals  being active with spending time with family,. 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 Pt had no contraindications     Exam Type  Vaginal    Palpation  no tightness    Strength  good squeeze, good lift, able to hold agaisnt strong resistance    Strength # of  reps  5    Strength # of seconds  5        [facility] Adult PT Treatment/Exercise - 04/05/17 1331      Therapeutic Activites    Other Therapeutic Activities  discussed d/c       Neuro Re-ed    Neuro Re-ed Details   see pt instructions             PT Education - 04/05/17 1355    Education provided  Yes    Education Details  HEP, d/c     Person(s) Educated  Patient    Methods  Explanation;Demonstration;Tactile cues;Handout;Verbal cues    Comprehension  Verbalized understanding;Returned demonstration          PT Long Term Goals - 04/03/17 0915      PT LONG TERM GOAL #1   Title  Pt will decrease her ODI score from 32% to <  27% in order to return to ADLs (1/25: 32% ) (2/1: 22%)     Time  8    Period  Weeks    Status  Achieved      PT LONG TERM GOAL #2   Title  Pt will decrease her NIH-CPSI score from 81% in order to resotre pelvic floor function  ( 1/25: 42%, 2/1: 25%  )     Time  12    Period  Weeks    Status  Achieved      PT LONG TERM GOAL #3   Title  Pt will decrease her COREFO score from 30% to < 25% in order to have improved bowel frequency  (2/20: 23%)     Time  12    Period  Weeks    Status  Achieved      PT LONG TERM GOAL #4   Title  Pt will report better formed stools with consistency type (Bristol Stool Scale)  Type 4-5 across 75% of the time in order to be to able to go to social events    Time  12    Period  Weeks    Status  Achieved      PT LONG TERM GOAL #5   Title  Pt will demo decreased abdominal scar restrictions in order to progress to deep core coordination and strengthening    Time  2    Period  Weeks    Status  Achieved      PT LONG TERM GOAL #6   Title  Pt will report urinary frequency from 2x/ every 2 hours to 1x every 2 hours in order to travel with family     Time  10    Period  Weeks    Status  Achieved      PT LONG TERM GOAL #7   Title  Pt will report not multitasking when eating her meals in order to optimize parasympathetic nervous system for proper digestion and GI health    Time  2    Period  Weeks    Status  Achieved      PT LONG TERM GOAL #8   Title  Pt will demo proper technique with coactivation of pelvic floor ,  deep core, and alignment in fitness routines and yoga  in order to minimize risks for injury or relapse of Sx    Time  8    Period  Weeks    Status  Achieved      PT LONG TERM GOAL  #9   TITLE  Pt  will decrease her PFDI score from 40% to < 30% in order to optimize pelvic floor function  (2/1: 39%, 3/18: 37%, 4/1: 39%  )      Time  8    Period  Weeks    Status  Not Met      PT LONG TERM GOAL  #10   TITLE  Pt will report no B knee pain  after ZUMBA classes in order to maintain this type of fitness routine     Time  4    Period  Weeks    Status  Achieved      PT LONG TERM GOAL  #11   TITLE  Pt will be able to demo no gait deviations / no report of B knee pain with running/jogging for 15 min on the treadmill without incline in order to to progress towards running with less risk for injuries     Time  12    Period  Weeks    Status  Achieved            Plan - 04/05/17 1358    Clinical Impression Statement  Across 16 visits, pt has achieved 10/11 goals. Pt's urinary frequency and stool consistency have improved. Pt's C-scar scar restrictions and pelvic floor tightness have decreased which have in turn, improved her deep core coordination strength. Pt has demo'd improved postural stability in fitness routines and less gait deviations with running.  Pt is working in progressing her pelvic floor strength. Pt reports her back pain, difficulty with emptying, urinary frequency, and loose forming stools and constipation have all improved "A VERY GREAT A DEAL BETTER" based on the GROC scale. Pt has made progress with relaxation practices which has improved her GI Sx and promoted proper pelvic floor ROM and coordination.   Pt reports no more knee pain with running but would benefit from further PT at a later point in time to get back to running with less risk for relapse of Sx.  Pt will be d/c at this time due to need for f/u with 1)  her sinus issues because her sinus medications have caused relapse of diarrhea symptoms 2) acute elbow pain.  Pt was recommended to return to pelvic PT to address her remaining goals related to urinary leakage and return to running after she has followed up with her MD for her elbow pain. Pt may benefit from PT for elbow pain.  PT had recommended pt undergo a sleep study to screen for OSA due to her risk factors with sinus issus and she is in the process of getting an appt scheduled.     Rehab Potential  Good     PT Frequency  1x / week    PT Duration  12 weeks    PT Treatment/Interventions  Therapeutic activities;Therapeutic exercise;Functional mobility training;Neuromuscular re-education;Manual techniques;Patient/family education;Moist Heat;Balance training;Stair training;Energy conservation;Taping    Consulted and Agree with Plan of Care  Patient       Patient will benefit from skilled therapeutic intervention in order to improve the following deficits and impairments:  Improper body mechanics, Decreased range of motion, Decreased activity tolerance, Decreased safety awareness, Decreased strength, Hypermobility, Increased fascial restricitons, Postural dysfunction, Pain, Decreased endurance, Decreased balance, Increased muscle spasms, Decreased mobility, Decreased coordination  Visit Diagnosis: Other lack of coordination  Myalgia  Other symptoms and signs involving the musculoskeletal system     Problem List Patient Active Problem List   Diagnosis Date Noted  . Perennial allergic rhinitis with a  nonallergic component 01/31/2017  . Asthmatic bronchitis 01/31/2017  . History of food allergy 01/31/2017  . Chest pain 12/13/2012  . Dyspnea 12/13/2012    Jerl Mina ,PT, DPT, E-RYT  04/05/2017, 4:24 PM  [place] MAIN [facility] SERVICES [address] [place], [place], [address] Phone: [phone]   Fax:  [phone]  Name: DEMRI POULTON MRN: [id number] Date of Birth: September 27, 1970

## 2017-04-07 DIAGNOSIS — M778 Other enthesopathies, not elsewhere classified: Secondary | ICD-10-CM | POA: Diagnosis not present

## 2017-04-17 ENCOUNTER — Ambulatory Visit: Payer: 59 | Admitting: Physical Therapy

## 2017-04-24 ENCOUNTER — Telehealth: Payer: Self-pay

## 2017-04-24 MED FILL — XIIDRA 5% EYE DROPS: 5 | 30 days supply | Qty: 60 | Fill #4

## 2017-04-24 NOTE — Telephone Encounter (Signed)
Patient came in stating that the cost of her Ryvent was too much and was wondering why because it hasn't been. I informed pt that we have started using [facility] for Ryvent and that the cost would be $35 or less. Patient then stated that it was said to take up to 4 times a day but she cant because it is making her feel loopy and it is also affecting her IBS and would like to know if there is something else she can take instead. Please advise

## 2017-04-24 NOTE — Telephone Encounter (Signed)
The medication is up to 4 times per day as needed. But if it makes her feel loopy then we can switch her to levocetirizine. Thanks.

## 2017-04-25 ENCOUNTER — Ambulatory Visit (INDEPENDENT_AMBULATORY_CARE_PROVIDER_SITE_OTHER): Payer: 59 | Admitting: Neurology

## 2017-04-25 ENCOUNTER — Other Ambulatory Visit: Payer: Self-pay

## 2017-04-25 DIAGNOSIS — R519 Headache, unspecified: Secondary | ICD-10-CM

## 2017-04-25 DIAGNOSIS — J449 Chronic obstructive pulmonary disease, unspecified: Secondary | ICD-10-CM

## 2017-04-25 DIAGNOSIS — G4733 Obstructive sleep apnea (adult) (pediatric): Secondary | ICD-10-CM | POA: Diagnosis not present

## 2017-04-25 DIAGNOSIS — G4720 Circadian rhythm sleep disorder, unspecified type: Secondary | ICD-10-CM

## 2017-04-25 DIAGNOSIS — R351 Nocturia: Secondary | ICD-10-CM

## 2017-04-25 DIAGNOSIS — G4719 Other hypersomnia: Secondary | ICD-10-CM

## 2017-04-25 DIAGNOSIS — R0683 Snoring: Secondary | ICD-10-CM

## 2017-04-25 DIAGNOSIS — E669 Obesity, unspecified: Secondary | ICD-10-CM

## 2017-04-25 DIAGNOSIS — R0681 Apnea, not elsewhere classified: Secondary | ICD-10-CM

## 2017-04-25 DIAGNOSIS — R51 Headache: Secondary | ICD-10-CM

## 2017-04-25 MED ORDER — LEVOCETIRIZINE DIHYDROCHLORIDE 5 MG PO TABS: 5.0000 mg | ORAL_TABLET | Freq: Every evening | ORAL | 5 refills | Status: DC

## 2017-04-25 MED FILL — LEVOCETIRIZINE 5 MG TABLET: 5 | 30 days supply | Qty: 30 | Fill #0

## 2017-04-25 NOTE — Telephone Encounter (Signed)
Spoke with pt she will stopp ryvent but will save it for the bad days with her allergies I did send in levoceterizine for pt to take she did state she use to take it

## 2017-04-28 NOTE — Addendum Note (Signed)
Addended by: Star Age on: 04/28/2017 09:34 AM   Modules accepted: Orders

## 2017-04-28 NOTE — Progress Notes (Signed)
Patient referred by PCP, seen by me on 03/23/17, diagnostic PSG on 04/25/17.    Please call and notify the patient that the recent sleep study did confirm the diagnosis of obstructive sleep apnea. OSA is overall mild, but worth treating to see if she feels better after treatment. To that end I recommend treatment for this in the form of autoPAP, which means, that we don't have to bring her back for a second sleep study with CPAP, but will let him try an autoPAP machine at home, through a DME company (of her choice, or as per insurance requirement). The DME representative will educate her on how to use the machine, how to put the mask on, etc. I have placed an order in the chart. Please send referral, talk to patient, send report to referring MD. We will need a FU in sleep clinic for 10 weeks post-PAP set up, please arrange that with me or one of our NPs. Thanks,   Karen Age, MD, PhD [facility])

## 2017-04-28 NOTE — Procedures (Signed)
PATIENT'S NAME:  Karen [name], Karen [name] DOB:      [birth year]      MR#:    [id number]     DATE OF RECORDING: 04/25/2017 REFERRING M.D.:  Leeroy Cha, MD Study Performed:   Baseline Polysomnogram HISTORY: 47 year old right-handed woman with an underlying medical history of mild intermittent asthma, irritable bowel syndrome, food intolerances, allergic rhinitis, interstitial cystitis, history of DVT in 1992, reflux disease, migraine headaches, history of vertigo, kidney stones, and borderline obesity, who reports snoring and excessive daytime somnolence as well as apneic pauses while asleep per husband's report. The patient endorsed the Epworth Sleepiness Scale at 11/24 points. The patient's weight 200 pounds with a height of 68 (inches), resulting in a BMI of 31.1 kg/m2. The patient's neck circumference measured 15.5 inches.  CURRENT MEDICATIONS: Albuterol, Azelastine, Calcium-Vitamin D, Carbinoxamine, Clindamycin, Dapsone, Deslansoprazole, Ibuprofen, Lifitegrast, Methylnaltrexone, Multi-Vitamin, Ondansetron and B-12.   PROCEDURE:  This is a multichannel digital polysomnogram utilizing the Somnostar 11.2 system.  Electrodes and sensors were applied and monitored per AASM Specifications.   EEG, EOG, Chin and Limb EMG, were sampled at 200 Hz.  ECG, Snore and Nasal Pressure, Thermal Airflow, Respiratory Effort, CPAP Flow and Pressure, Oximetry was sampled at 50 Hz. Digital video and audio were recorded.      BASELINE STUDY  Lights Out was at 21:33 and Lights On at 05:01.  Total recording time (TRT) was 448 minutes, with a total sleep time (TST) of  411 minutes.   The patient's sleep latency was 2 minutes.  REM latency was 93 minutes, which is normal. The sleep efficiency was 91.7%.     SLEEP ARCHITECTURE: WASO (Wake after sleep onset) was 35 minutes with one longer period of wakefulness towards the end of the study and otherwise no significant sleep fragmentation.  There were 17 minutes in Stage N1, 339  minutes Stage N2, 0 minutes Stage N3 and 55 minutes in Stage REM.  The percentage of Stage N1 was 4.1%, Stage N2 was 82.5%, which is increased, Stage N3 was absent and Stage R (REM sleep) was 13.4%, which is reduced. The arousals were noted as: 31 were spontaneous, 0 were associated with PLMs, 41 were associated with respiratory events.  RESPIRATORY ANALYSIS:  There were a total of 41 respiratory events:  0 obstructive apneas, 0 central apneas and 0 mixed apneas with a total of 0 apneas and an apnea index (AI) of 0 /hour. There were 41 hypopneas with a hypopnea index of 6. /hour. The patient also had 0 respiratory event related arousals (RERAs).      The total APNEA/HYPOPNEA INDEX (AHI) was 6.0/hour and the total RESPIRATORY DISTURBANCE INDEX was 0. 6. /hour.  28 events occurred in REM sleep and 26 events in NREM. The REM AHI was 30.5/hour, versus a non-REM AHI of 2.2. The patient spent 264.5 minutes of total sleep time in the supine position and 147 minutes in non-supine.. The supine AHI was 8.2 versus a non-supine AHI of 2.0.  OXYGEN SATURATION & C02:  The Wake baseline 02 saturation was 95%, with the lowest being 80%. Time spent below 89% saturation equaled 14 minutes.  PERIODIC LIMB MOVEMENTS: The patient had a total of 0 Periodic Limb Movements.  The Periodic Limb Movement (PLM) index was 0 and the PLM Arousal index was 0/hour.  Audio and video analysis did not show any abnormal or unusual movements, behaviors, phonations or vocalizations. The patient took 1 bathroom break. Mild to moderate snoring was noted. The EKG was in  keeping with normal sinus rhythm (NSR).   Post-study, the patient indicated that sleep was better than usual.   IMPRESSION: 1. Obstructive Sleep Apnea (OSA) 2. Dysfunctions associated with sleep stages or arousal from sleep  RECOMMENDATIONS: 1. This study demonstrates overall mild obstructive sleep apnea, more pronounced in REM sleep with a total AHI of 6/hour, REM AHI of  30.5/hour, supine AHI of 8.2 and O2 nadir of 80%. Given the patient's medical history and sleep related complaints, treatment with positive airway pressure is recommended; this can be achieved in the form of autoPAP. Alternatively, a full-night CPAP titration study would allow optimization of therapy if needed. Other treatment options may include avoidance of supine sleep position along with weight loss, upper airway or jaw surgery in selected patients or the use of an oral appliance in certain patients. ENT evaluation and/or consultation with a maxillofacial surgeon or dentist may be feasible in some instances.    2. Please note that untreated obstructive sleep apnea may carry additional perioperative morbidity. Patients with significant obstructive sleep apnea should receive perioperative PAP therapy and the surgeons and particularly the anesthesiologist should be informed of the diagnosis and the severity of the sleep disordered breathing. 3. This study shows abnormal sleep stage percentages; these are nonspecific findings and per se do not signify an intrinsic sleep disorder or a cause for the patient's sleep-related symptoms. Causes include (but are not limited to) the first night effect of the sleep study, circadian rhythm disturbances, medication effect or an underlying mood disorder or medical problem.  4. The patient should be cautioned not to drive, work at heights, or operate dangerous or heavy equipment when tired or sleepy. Review and reiteration of good sleep hygiene measures should be pursued with any patient. 5. The patient will be seen in follow-up by Dr. Rexene Alberts at [facility] for discussion of the test results and further management strategies. The referring provider will be notified of the test results.  I certify that I have reviewed the entire raw data recording prior to the issuance of this report in accordance with the Standards of Accreditation of the American Academy of Sleep Medicine  (AASM)   Star Age, MD, PhD Diplomat, American Board of Psychiatry and Neurology (Neurology and Sleep Medicine)

## 2017-05-01 ENCOUNTER — Other Ambulatory Visit: Payer: Self-pay

## 2017-05-01 ENCOUNTER — Ambulatory Visit: Payer: 59 | Admitting: Occupational Therapy

## 2017-05-01 ENCOUNTER — Encounter: Payer: 59 | Admitting: Physical Therapy

## 2017-05-01 ENCOUNTER — Telehealth: Payer: Self-pay

## 2017-05-01 ENCOUNTER — Encounter: Payer: Self-pay | Admitting: Occupational Therapy

## 2017-05-01 DIAGNOSIS — M6281 Muscle weakness (generalized): Secondary | ICD-10-CM | POA: Diagnosis not present

## 2017-05-01 DIAGNOSIS — M25522 Pain in left elbow: Secondary | ICD-10-CM | POA: Diagnosis not present

## 2017-05-01 DIAGNOSIS — R278 Other lack of coordination: Secondary | ICD-10-CM | POA: Diagnosis not present

## 2017-05-01 DIAGNOSIS — M791 Myalgia, unspecified site: Secondary | ICD-10-CM | POA: Diagnosis not present

## 2017-05-01 DIAGNOSIS — M25632 Stiffness of left wrist, not elsewhere classified: Secondary | ICD-10-CM

## 2017-05-01 DIAGNOSIS — G4719 Other hypersomnia: Secondary | ICD-10-CM

## 2017-05-01 DIAGNOSIS — R29898 Other symptoms and signs involving the musculoskeletal system: Secondary | ICD-10-CM | POA: Diagnosis not present

## 2017-05-01 NOTE — Telephone Encounter (Signed)
-----   Message from Star Age, MD sent at 04/28/2017  9:34 AM EDT ----- Patient referred by PCP, seen by me on 03/23/17, diagnostic PSG on 04/25/17.    Please call and notify the patient that the recent sleep study did confirm the diagnosis of obstructive sleep apnea. OSA is overall mild, but worth treating to see if she feels better after treatment. To that end I recommend treatment for this in the form of autoPAP, which means, that we don't have to bring her back for a second sleep study with CPAP, but will let him try an autoPAP machine at home, through a DME company (of her choice, or as per insurance requirement). The DME representative will educate her on how to use the machine, how to put the mask on, etc. I have placed an order in the chart. Please send referral, talk to patient, send report to referring MD. We will need a FU in sleep clinic for 10 weeks post-PAP set up, please arrange that with me or one of our NPs. Thanks,   Star Age, MD, PhD [facility])

## 2017-05-01 NOTE — Telephone Encounter (Signed)
I called pt to discuss her sleep study results. No answer, left a message asking her to call me back. 

## 2017-05-01 NOTE — Therapy (Signed)
[facility] [address], [place], [address] Phone: [phone]   Fax:  [phone]  Occupational Therapy Evaluation  Patient Details  Name: Karen [name] MRN: [id number] Date of Birth: [birth year] Referring Provider: Vertis Kelch   Encounter Date: 05/01/2017  OT End of Session - 05/01/17 1539    Visit Number  1    Number of Visits  12    Date for OT Re-Evaluation  06/12/17    OT Start Time  0917    OT Stop Time  1015    OT Time Calculation (min)  58 min    Activity Tolerance  Patient tolerated treatment well    Behavior During Therapy  [facility] for tasks assessed/performed       Past Medical History:  Diagnosis Date  . Adenomyosis   . Arthritis 2013   L knee gets steroid injections   . Asthmatic bronchitis 01/31/2017  . DVT of lower extremity (deep venous thrombosis) ([place])   . Dysmenorrhea   . Endometriosis   . Fibroids   . GERD (gastroesophageal reflux disease)   . Kidney stones   . Menorrhagia   . Migraine   . Recurrent upper respiratory infection (URI)   . Sebaceous cyst of breast, right lower inner quadrant 10/25/2012   Excised 11/21/12   . Syncope, non cardiac     Past Surgical History:  Procedure Laterality Date  . ARTHROSCOPIC HAGLUNDS REPAIR    . BREAST CYST EXCISION Right 11/21/2012   Procedure: CYST EXCISION BREAST;  Surgeon: Haywood Lasso, MD;  Location: [place];  Service: General;  Laterality: Right;  . BREAST EXCISIONAL BIOPSY Right 11/2012  . CESAREAN SECTION    . CHOLECYSTECTOMY    . COLONOSCOPY  05/02/2011   Procedure: COLONOSCOPY;  Surgeon: Winfield Cunas., MD;  Location: [facility];  Service: Endoscopy;  Laterality: N/A;  . colonoscopy  11/04/2014  . ESOPHAGEAL MANOMETRY N/A 11/04/2015   Procedure: ESOPHAGEAL MANOMETRY (EM);  Surgeon: Laurence Spates, MD;  [facility]: [facility];  Service: Endoscopy;  Laterality: N/A;  . ESOPHAGOGASTRODUODENOSCOPY   05/02/2011   Procedure: ESOPHAGOGASTRODUODENOSCOPY (EGD);  Surgeon: Winfield Cunas., MD;  Location: [facility];  Service: Endoscopy;  Laterality: N/A;  . ESOPHAGOGASTRODUODENOSCOPY N/A 09/01/2014   Procedure: ESOPHAGOGASTRODUODENOSCOPY (EGD);  Surgeon: Laurence Spates, MD;  Location: [facility];  Service: Endoscopy;  Laterality: N/A;  . KNEE SURGERY    . LAPAROSCOPY    . laproscopic abdominal    . [place] IMPEDANCE STUDY N/A 11/04/2015   Procedure: [place] IMPEDANCE STUDY;  Surgeon: Laurence Spates, MD;  [facility]: [facility];  Service: Endoscopy;  Laterality: N/A;  . VAGINAL HYSTERECTOMY      There were no vitals filed for this visit.  Subjective Assessment - 05/01/17 1531    Subjective   My elbow pain started about month ago - I started about that time doing band exercises for my core and pelvic and Zumba class and one of them is weight - I since then stop them now     Patient Stated Goals  I want to have the pain better in my elbow so I can do workouts again     Currently in Pain?  Yes    Pain Score  4     Pain Location  Elbow    Pain Orientation  Left    Pain Descriptors / Indicators  Stabbing;Sharp;Shooting    Pain Type  Acute pain    Pain Onset  More than a month ago        [facility] OT Assessment - 05/01/17 0001      Assessment   Medical Diagnosis  L lateral epicondylitis     Referring Provider  Varadarjan    Onset Date/Surgical Date  03/31/17    Hand Dominance  Right    Prior Therapy  -- having PT for pelvic floor      Precautions   Required Braces or Orthoses  -- Recommend counter force splint       Home  Environment   Lives With  Family      Prior Function   Vocation  Full time employment    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 , do own house work , bookclub       Strength   Right Hand Grip (lbs)  50 same extended arm    Left Hand Grip (lbs)  40 same extended arm with pain         HEP review with pt and education done    Pt to  get counter force splint for forearm -and ed on donning and making sure it is in correct place   Heat 10 min  Massage  - did some graston nr 2 over dorsal forearm and lateral epicondyle -and cross friction massage  AROM wrist flexion stretch with elbow to side  5 reps hold 5 sec  Pain free  And then ice  - did ice massage   Modifications ed on using L hand and arm              OT Education - 05/01/17 1539    Education provided  Yes    Education Details  findings of eval ,HEP , splint use and fit       OT Short Term Goals - 05/01/17 1546      OT SHORT TERM GOAL #1   Title  Pain on L elbow on PREE decrease more than 20 points     Baseline  pain at eval on PREE 40/50    Time  4    Period  Weeks    Status  New    Target Date  05/29/17      OT SHORT TERM GOAL #2   Title  Pt to be independent in HEP to wear splint , decrease pain and maintain and increase ROM and strength in L elbow     Baseline  no splint or knowledge of HEP     Time  3    Period  Weeks    Status  New    Target Date  05/22/17        OT Long Term Goals - 05/01/17 1548      OT LONG TERM GOAL #1   Title  Pt use of L arm improve for function on PREE score by more than  15 points     Baseline  PREE score for function 29/50     Time  6    Period  Weeks    Status  New    Target Date  06/12/17      OT LONG TERM GOAL #2   Title  L elbow and grip strength improve to WNL for pt and in range for her age to use in all ADL's and IADL's without increase symptoms     Baseline  pain 4-9 /10 pain with functional use  Plan - 05/01/17 1540    Clinical Impression Statement  Pt present with L lateral epicondylitis , symptoms started about 4 wks ago - only thing that change per pt was she started doing more strengthening HEP with arms using bands and weigths - she  is tender over lateral epicondyle , pain with resistance to wrist and 3rd digit extention - tenderness and pain between 4-8/10  - pt  has no splints  and increase pain with functional task - grip decrease and pain with extended arm - pt can benefit from OT services     Occupational performance deficits (Please refer to evaluation for details):  ADL's;IADL's;Work;Play;Leisure    Rehab Potential  Good    OT Frequency  2x / week    OT Duration  6 weeks    OT Treatment/Interventions  Iontophoresis;Self-care/ADL training;Patient/family education;Splinting;Cryotherapy;Ultrasound;Manual Therapy;Passive range of motion;Therapeutic exercise    Plan  assess progress and update HEP - assess fit of pt counter force splint     Clinical Decision Making  Several treatment options, min-mod task modification necessary    OT Home Exercise Plan  see pt instruction    Consulted and Agree with Plan of Care  Patient       Patient will benefit from skilled therapeutic intervention in order to improve the following deficits and impairments:  Impaired flexibility, Pain, Decreased strength, Decreased range of motion, Impaired UE functional use, Decreased knowledge of precautions  Visit Diagnosis: Pain in left elbow - Plan: Ot plan of care cert/re-cert  Stiffness of left wrist, not elsewhere classified - Plan: Ot plan of care cert/re-cert  Muscle weakness (generalized) - Plan: Ot plan of care cert/re-cert    Problem List Patient Active Problem List   Diagnosis Date Noted  . Perennial allergic rhinitis with a nonallergic component 01/31/2017  . Asthmatic bronchitis 01/31/2017  . History of food allergy 01/31/2017  . Chest pain 12/13/2012  . Dyspnea 12/13/2012    Rosalyn Gess OTR/L,CLT 05/01/2017, 3:54 PM  [facility] [address] [place], [address] Phone: [phone]   Fax:  [phone]  Name: Karen [name] MRN: [id number] Date of Birth: 10/10/1970

## 2017-05-01 NOTE — Telephone Encounter (Signed)
Pt returned my call. I advised pt that Dr. Rexene Alberts reviewed their sleep study results and found that pt does have mild osa. Dr. Rexene Alberts recommends that pt start an auto pap. I reviewed PAP compliance expectations with the pt. Pt is agreeable to starting an auto-PAP. I advised pt that an order will be sent to a DME, [facility], and [facility] will call the pt within about one week after they file with the pt's insurance. [facility] will show the pt how to use the machine, fit for masks, and troubleshoot the auto-PAP if needed. A follow up appt was made for insurance purposes with Dr. Rexene Alberts on 07/27/17 at 1:00pm. Pt verbalized understanding to arrive 15 minutes early and bring their auto-PAP. A letter with all of this information in it will be sent to the pt's mychart as a reminder. I verified with the pt that the address we have on file is correct. Pt verbalized understanding of results. Pt had no questions at this time but was encouraged to call back if questions arise.

## 2017-05-01 NOTE — Patient Instructions (Signed)
Pt to get counter force splint for forearm -and ed on donning and making sure it is in correct place   Heat  Massage  AROM wrist flexion stretch with elbow to side  5 reps hold 5 sec  Pain free  And then ice   Modifications ed on

## 2017-05-05 ENCOUNTER — Ambulatory Visit: Payer: 59 | Attending: Internal Medicine | Admitting: Occupational Therapy

## 2017-05-05 DIAGNOSIS — M25522 Pain in left elbow: Secondary | ICD-10-CM | POA: Diagnosis not present

## 2017-05-05 DIAGNOSIS — M6281 Muscle weakness (generalized): Secondary | ICD-10-CM | POA: Diagnosis not present

## 2017-05-05 DIAGNOSIS — M25632 Stiffness of left wrist, not elsewhere classified: Secondary | ICD-10-CM | POA: Diagnosis not present

## 2017-05-05 DIAGNOSIS — R278 Other lack of coordination: Secondary | ICD-10-CM | POA: Insufficient documentation

## 2017-05-05 NOTE — Patient Instructions (Signed)
Same

## 2017-05-05 NOTE — Therapy (Signed)
[name] [address], [place], [address] Phone: [phone]   Fax:  [phone]  Occupational Therapy Treatment  Patient Details  Name: Karen [name] MRN: [id number] Date of Birth: [birth year] Referring Provider: Vertis Kelch   Encounter Date: 05/05/2017  OT End of Session - 05/05/17 1125    Visit Number  2    Number of Visits  12    Date for OT Re-Evaluation  06/12/17    OT Start Time  0945    OT Stop Time  1039    OT Time Calculation (min)  54 min    Activity Tolerance  Patient tolerated treatment well    Behavior During Therapy  [facility] for tasks assessed/performed       Past Medical History:  Diagnosis Date  . Adenomyosis   . Arthritis 2013   L knee gets steroid injections   . Asthmatic bronchitis 01/31/2017  . DVT of lower extremity (deep venous thrombosis) ([place])   . Dysmenorrhea   . Endometriosis   . Fibroids   . GERD (gastroesophageal reflux disease)   . Kidney stones   . Menorrhagia   . Migraine   . Recurrent upper respiratory infection (URI)   . Sebaceous cyst of breast, right lower inner quadrant 10/25/2012   Excised 11/21/12   . Syncope, non cardiac     Past Surgical History:  Procedure Laterality Date  . ARTHROSCOPIC HAGLUNDS REPAIR    . BREAST CYST EXCISION Right 11/21/2012   Procedure: CYST EXCISION BREAST;  Surgeon: Haywood Lasso, MD;  Location: [place];  Service: General;  Laterality: Right;  . BREAST EXCISIONAL BIOPSY Right 11/2012  . CESAREAN SECTION    . CHOLECYSTECTOMY    . COLONOSCOPY  05/02/2011   Procedure: COLONOSCOPY;  Surgeon: Winfield Cunas., MD;  Location: [facility];  Service: Endoscopy;  Laterality: N/A;  . colonoscopy  11/04/2014  . ESOPHAGEAL MANOMETRY N/A 11/04/2015   Procedure: ESOPHAGEAL MANOMETRY (EM);  Surgeon: Laurence Spates, MD;  [facility];  Service: Endoscopy;  Laterality: N/A;  . ESOPHAGOGASTRODUODENOSCOPY   05/02/2011   Procedure: ESOPHAGOGASTRODUODENOSCOPY (EGD);  Surgeon: Winfield Cunas., MD;  Location: [facility];  Service: Endoscopy;  Laterality: N/A;  . ESOPHAGOGASTRODUODENOSCOPY N/A 09/01/2014   Procedure: ESOPHAGOGASTRODUODENOSCOPY (EGD);  Surgeon: Laurence Spates, MD;  Location: [facility];  Service: Endoscopy;  Laterality: N/A;  . KNEE SURGERY    . LAPAROSCOPY    . laproscopic abdominal    . [place] IMPEDANCE STUDY N/A 11/04/2015   Procedure: [place] IMPEDANCE STUDY;  Surgeon: Laurence Spates, MD;  [facility];  Service: Endoscopy;  Laterality: N/A;  . VAGINAL HYSTERECTOMY      There were no vitals filed for this visit.  Subjective Assessment - 05/05/17 1119    Subjective   I did not realize how much I do use  my elbow - my arm little more sore - but not the same pain - I did get the strap - you just need to see if it is in the correct spot     Patient Stated Goals  I want to have the pain better in my elbow so I can do workouts again     Currently in Pain?  Yes    Pain Score  6     Pain Location  Elbow    Pain Orientation  Left    Pain Descriptors / Indicators  Sore    Pain Type  Acute  pain    Pain Onset  More than a month ago                   OT Treatments/Exercises (OP) - 05/05/17 0001      Iontophoresis   Type of Iontophoresis  Dexamethasone    Location  L lateral epicondyle     Dose  med patch, current 2.0 and then decrease to 1.8     Time  21 min      LUE Contrast Bath   Time  11 minutes    Comments  prior to soft tissue mobs        Soft tissue mobs done after contrast - Graston tool nr 2 for sweeping over dorsal forearm - and massage manual cross friction over lateral epicondyle  Pt to only do 2-3 min at time  And can do contrast at home  Cont AROM wrist flexion with elbow to side  And then ed pt on donning counter force splint correctly - less pain with it on than without when picking up object palm down    ionto done - skin check done prior  and afterwards - no issues        OT Education - 05/05/17 1125    Education provided  Yes    Education Details  ed on ionto and applying counter force splint correctly     Person(s) Educated  Patient    Methods  Explanation    Comprehension  Verbalized understanding;Returned demonstration       OT Short Term Goals - 05/01/17 1546      OT SHORT TERM GOAL #1   Title  Pain on L elbow on PREE decrease more than 20 points     Baseline  pain at eval on PREE 40/50    Time  4    Period  Weeks    Status  New    Target Date  05/29/17      OT SHORT TERM GOAL #2   Title  Pt to be independent in HEP to wear splint , decrease pain and maintain and increase ROM and strength in L elbow     Baseline  no splint or knowledge of HEP     Time  3    Period  Weeks    Status  New    Target Date  05/22/17        OT Long Term Goals - 05/01/17 1548      OT LONG TERM GOAL #1   Title  Pt use of L arm improve for function on PREE score by more than  15 points     Baseline  PREE score for function 29/50     Time  6    Period  Weeks    Status  New    Target Date  06/12/17      OT LONG TERM GOAL #2   Title  L elbow and grip strength improve to WNL for pt and in range for her age to use in all ADL's and IADL's without increase symptoms     Baseline  pain 4-9 /10 pain with functional use             Plan - 05/05/17 1126    Clinical Impression Statement  Pt little more sore this date compare to eval - pt to massage shorter time and only 2-3 x day and can do contrast if needed - ed pt on use of counter force splint -  was little to down on arm when she come in - had less pain with  splint on and compare to when she come in -  tolerate ionto without issues     Occupational performance deficits (Please refer to evaluation for details):  ADL's;IADL's;Work;Play;Leisure    Rehab Potential  Good    OT Frequency  2x / week    OT Duration  6 weeks    OT Treatment/Interventions   Iontophoresis;Self-care/ADL training;Patient/family education;Splinting;Cryotherapy;Ultrasound;Manual Therapy;Passive range of motion;Therapeutic exercise    Plan  assess pain improve and use of counter force splint     Clinical Decision Making  Several treatment options, min-mod task modification necessary    OT Home Exercise Plan  see pt instruction    Consulted and Agree with Plan of Care  Patient       Patient will benefit from skilled therapeutic intervention in order to improve the following deficits and impairments:  Impaired flexibility, Pain, Decreased strength, Decreased range of motion, Impaired UE functional use, Decreased knowledge of precautions  Visit Diagnosis: Pain in left elbow  Stiffness of left wrist, not elsewhere classified  Muscle weakness (generalized)    Problem List Patient Active Problem List   Diagnosis Date Noted  . Perennial allergic rhinitis with a nonallergic component 01/31/2017  . Asthmatic bronchitis 01/31/2017  . History of food allergy 01/31/2017  . Chest pain 12/13/2012  . Dyspnea 12/13/2012    Rosalyn Gess OTR/L,CLT 05/05/2017, 11:30 AM  [facility] [address] [place], [address] Phone: [phone]   Fax:  [phone]  Name: [name] MRN: [id number] Date of Birth: Nov 26, 1970

## 2017-05-10 MED FILL — RELISTOR 150 MG TABLET: 150 | 30 days supply | Qty: 60 | Fill #2

## 2017-05-11 ENCOUNTER — Ambulatory Visit: Payer: 59 | Admitting: Occupational Therapy

## 2017-05-11 DIAGNOSIS — M25632 Stiffness of left wrist, not elsewhere classified: Secondary | ICD-10-CM | POA: Diagnosis not present

## 2017-05-11 DIAGNOSIS — R278 Other lack of coordination: Secondary | ICD-10-CM | POA: Diagnosis not present

## 2017-05-11 DIAGNOSIS — M25522 Pain in left elbow: Secondary | ICD-10-CM | POA: Diagnosis not present

## 2017-05-11 DIAGNOSIS — M6281 Muscle weakness (generalized): Secondary | ICD-10-CM

## 2017-05-11 NOTE — Patient Instructions (Signed)
Same HEP  

## 2017-05-11 NOTE — Therapy (Signed)
[facility] [address] [place], [address] Phone: [phone]   Fax:  [phone]  Occupational Therapy Treatment  Patient Details  Name: Karen [name] MRN: [id number] Date of Birth: 10/26/1970 Referring Provider: Vertis Kelch   Encounter Date: 05/11/2017  OT End of Session - 05/11/17 0944    Visit Number  3    Number of Visits  12    Date for OT Re-Evaluation  06/12/17    OT Start Time  0934    OT Stop Time  1014    OT Time Calculation (min)  40 min    Activity Tolerance  Patient tolerated treatment well    Behavior During Therapy  [facility] for tasks assessed/performed       Past Medical History:  Diagnosis Date  . Adenomyosis   . Arthritis 2013   L knee gets steroid injections   . Asthmatic bronchitis 01/31/2017  . DVT of lower extremity (deep venous thrombosis) ([place])   . Dysmenorrhea   . Endometriosis   . Fibroids   . GERD (gastroesophageal reflux disease)   . Kidney stones   . Menorrhagia   . Migraine   . Recurrent upper respiratory infection (URI)   . Sebaceous cyst of breast, right lower inner quadrant 10/25/2012   Excised 11/21/12   . Syncope, non cardiac     Past Surgical History:  Procedure Laterality Date  . ARTHROSCOPIC HAGLUNDS REPAIR    . BREAST CYST EXCISION Right 11/21/2012   Procedure: CYST EXCISION BREAST;  Surgeon: Haywood Lasso, MD;  Location: [place];  Service: General;  Laterality: Right;  . BREAST EXCISIONAL BIOPSY Right 11/2012  . CESAREAN SECTION    . CHOLECYSTECTOMY    . COLONOSCOPY  05/02/2011   Procedure: COLONOSCOPY;  Surgeon: Winfield Cunas., MD;  Location: [facility];  Service: Endoscopy;  Laterality: N/A;  . colonoscopy  11/04/2014  . ESOPHAGEAL MANOMETRY N/A 11/04/2015   Procedure: ESOPHAGEAL MANOMETRY (EM);  Surgeon: Laurence Spates, MD;  [facility];  Service: Endoscopy;  Laterality: N/A;  . ESOPHAGOGASTRODUODENOSCOPY   05/02/2011   Procedure: ESOPHAGOGASTRODUODENOSCOPY (EGD);  Surgeon: Winfield Cunas., MD;  Location: [facility];  Service: Endoscopy;  Laterality: N/A;  . ESOPHAGOGASTRODUODENOSCOPY N/A 09/01/2014   Procedure: ESOPHAGOGASTRODUODENOSCOPY (EGD);  Surgeon: Laurence Spates, MD;  Location: [facility];  Service: Endoscopy;  Laterality: N/A;  . KNEE SURGERY    . LAPAROSCOPY    . laproscopic abdominal    . [place] IMPEDANCE STUDY N/A 11/04/2015   Procedure: [place] IMPEDANCE STUDY;  Surgeon: Laurence Spates, MD;  [facility];  Service: Endoscopy;  Laterality: N/A;  . VAGINAL HYSTERECTOMY      There were no vitals filed for this visit.  Subjective Assessment - 05/11/17 0941    Subjective   Pain better in my elbow - but we drove back from [place] - and I fell reaching iinto the car - and hit my wrist - so little sore     Patient Stated Goals  I want to have the pain better in my elbow so I can do workouts again     Currently in Pain?  Yes    Pain Score  3     Pain Location  Elbow    Pain Orientation  Left    Pain Descriptors / Indicators  Sore    Pain Type  Acute pain    Pain Onset  More than a month ago  Soft tissue mobs done after contrast - Graston tool nr 2 for sweeping over dorsal forearm - and massage manual cross friction over lateral epicondyle  Pt to only do 2-3 min at time  And can do contrast at home  Cont AROM wrist flexion with elbow to side  - tried close fist or  Extended arm - but increase pain     ionto done - skin check done prior  Pt to keep patch on for about hour             OT Treatments/Exercises (OP) - 05/11/17 0001      Iontophoresis   Type of Iontophoresis  Dexamethasone    Location  L lateral epicondyle     Dose  med patch, current 2.0 and then decrease to 1.8       LUE Contrast Bath   Time  11 minutes    Comments  prior to ROM and soft tissue              OT Education - 05/11/17 0944    Education provided  Yes    Education  Details  Findings and progress - HEP     Person(s) Educated  Patient    Methods  Explanation;Demonstration;Tactile cues    Comprehension  Returned demonstration       OT Short Term Goals - 05/01/17 1546      OT SHORT TERM GOAL #1   Title  Pain on L elbow on PREE decrease more than 20 points     Baseline  pain at eval on PREE 40/50    Time  4    Period  Weeks    Status  New    Target Date  05/29/17      OT SHORT TERM GOAL #2   Title  Pt to be independent in HEP to wear splint , decrease pain and maintain and increase ROM and strength in L elbow     Baseline  no splint or knowledge of HEP     Time  3    Period  Weeks    Status  New    Target Date  05/22/17        OT Long Term Goals - 05/01/17 1548      OT LONG TERM GOAL #1   Title  Pt use of L arm improve for function on PREE score by more than  15 points     Baseline  PREE score for function 29/50     Time  6    Period  Weeks    Status  New    Target Date  06/12/17      OT LONG TERM GOAL #2   Title  L elbow and grip strength improve to WNL for pt and in range for her age to use in all ADL's and IADL's without increase symptoms     Baseline  pain 4-9 /10 pain with functional use             Plan - 05/11/17 0945    Clinical Impression Statement  Pt pain and soreness decrease in L lateral epicondyle - but still increase pain with extensor stretches - pt to keep at elbow to side and open hand AROM - pt injured her thumb this weekend - pt to do some ice and rest it - cont with same HEP for elbow    Occupational performance deficits (Please refer to evaluation for details):  ADL's;IADL's;Work;Play;Leisure    Rehab Potential  Good    OT Frequency  2x / week    OT Duration  6 weeks    OT Treatment/Interventions  Iontophoresis;Self-care/ADL training;Patient/family education;Splinting;Cryotherapy;Ultrasound;Manual Therapy;Passive range of motion;Therapeutic exercise    Plan  assess pain improve and use of counter force  splint  and same HEP     Clinical Decision Making  Several treatment options, min-mod task modification necessary    OT Home Exercise Plan  see pt instruction    Consulted and Agree with Plan of Care  Patient       Patient will benefit from skilled therapeutic intervention in order to improve the following deficits and impairments:  Impaired flexibility, Pain, Decreased strength, Decreased range of motion, Impaired UE functional use, Decreased knowledge of precautions  Visit Diagnosis: Pain in left elbow  Stiffness of left wrist, not elsewhere classified  Muscle weakness (generalized)    Problem List Patient Active Problem List   Diagnosis Date Noted  . Perennial allergic rhinitis with a nonallergic component 01/31/2017  . Asthmatic bronchitis 01/31/2017  . History of food allergy 01/31/2017  . Chest pain 12/13/2012  . Dyspnea 12/13/2012    Rosalyn Gess OTR/L,CLT 05/11/2017, 6:43 PM  [facility] [address], [place], [address] Phone: [phone]   Fax:  [phone]  Name: Karen [name] MRN: [id number] Date of Birth: 1970/02/06

## 2017-05-12 DIAGNOSIS — G4733 Obstructive sleep apnea (adult) (pediatric): Secondary | ICD-10-CM | POA: Diagnosis not present

## 2017-05-15 NOTE — Telephone Encounter (Signed)
I spoke with Dr. Rexene Alberts. Pt should get evaluated for chest pain to rule out cardiac etiologies for chest pain. Dr. Rexene Alberts would also reduce pt's auto pap pressure to 5-9 cm H2O if pt would like.  I called pt and explained this to her. I explained the urgency of chest pain and the need for her to seek evaluation urgently to r/o cardiac problems. Pt understands this and has agreed to seek urgent evaluation such as urgent care or ER. Pt reports that she would like to hold off on reducing the pressure for now, since [facility] reduced the humidity. She will see how she does today and call me back tomorrow if she would like [place] to reduce the pressure.

## 2017-05-15 NOTE — Telephone Encounter (Signed)
Nothing further needed, thx

## 2017-05-15 NOTE — Telephone Encounter (Signed)
Last 2 nights,when waking the mask has a lot of condensation and this morning it was in the mask and tubing. She also experienced pain in the chest this morning and still this afternoon, feels sore to cough or breathe deep. She has called [facility] and they reduced the humidity from auto to [facility] and decreased from 4 to 3. She said they are thinking maybe the pressure maybe too high also. Please call to advise at [phone]

## 2017-05-16 ENCOUNTER — Ambulatory Visit: Payer: 59 | Admitting: Occupational Therapy

## 2017-05-16 DIAGNOSIS — M25522 Pain in left elbow: Secondary | ICD-10-CM

## 2017-05-16 DIAGNOSIS — M25632 Stiffness of left wrist, not elsewhere classified: Secondary | ICD-10-CM | POA: Diagnosis not present

## 2017-05-16 DIAGNOSIS — R278 Other lack of coordination: Secondary | ICD-10-CM | POA: Diagnosis not present

## 2017-05-16 DIAGNOSIS — M6281 Muscle weakness (generalized): Secondary | ICD-10-CM

## 2017-05-16 NOTE — Therapy (Signed)
[facility] [address], [place], [address] Phone: [phone]   Fax:  [phone]  Occupational Therapy Treatment  Patient Details  Name: Karen [name] MRN: [id number] Date of Birth: 25-Mar-1970 Referring Provider: Vertis Kelch   Encounter Date: 05/16/2017  OT End of Session - 05/16/17 1429    Visit Number  4    Number of Visits  12    Date for OT Re-Evaluation  06/12/17    OT Start Time  1349    OT Stop Time  1445    OT Time Calculation (min)  56 min    Activity Tolerance  Patient tolerated treatment well    Behavior During Therapy  [facility] for tasks assessed/performed       Past Medical History:  Diagnosis Date  . Adenomyosis   . Arthritis 2013   L knee gets steroid injections   . Asthmatic bronchitis 01/31/2017  . DVT of lower extremity (deep venous thrombosis) ([place])   . Dysmenorrhea   . Endometriosis   . Fibroids   . GERD (gastroesophageal reflux disease)   . Kidney stones   . Menorrhagia   . Migraine   . Recurrent upper respiratory infection (URI)   . Sebaceous cyst of breast, right lower inner quadrant 10/25/2012   Excised 11/21/12   . Syncope, non cardiac     Past Surgical History:  Procedure Laterality Date  . ARTHROSCOPIC HAGLUNDS REPAIR    . BREAST CYST EXCISION Right 11/21/2012   Procedure: CYST EXCISION BREAST;  Surgeon: Haywood Lasso, MD;  Location: [place];  Service: General;  Laterality: Right;  . BREAST EXCISIONAL BIOPSY Right 11/2012  . CESAREAN SECTION    . CHOLECYSTECTOMY    . COLONOSCOPY  05/02/2011   Procedure: COLONOSCOPY;  Surgeon: Winfield Cunas., MD;  Location: [facility];  Service: Endoscopy;  Laterality: N/A;  . colonoscopy  11/04/2014  . ESOPHAGEAL MANOMETRY N/A 11/04/2015   Procedure: ESOPHAGEAL MANOMETRY (EM);  Surgeon: Laurence Spates, MD;  Location: [facility];  Service: Endoscopy;  Laterality: N/A;  . ESOPHAGOGASTRODUODENOSCOPY   05/02/2011   Procedure: ESOPHAGOGASTRODUODENOSCOPY (EGD);  Surgeon: Winfield Cunas., MD;  Location: [facility];  Service: Endoscopy;  Laterality: N/A;  . ESOPHAGOGASTRODUODENOSCOPY N/A 09/01/2014   Procedure: ESOPHAGOGASTRODUODENOSCOPY (EGD);  Surgeon: Laurence Spates, MD;  Location: [facility];  Service: Endoscopy;  Laterality: N/A;  . KNEE SURGERY    . LAPAROSCOPY    . laproscopic abdominal    . [place] IMPEDANCE STUDY N/A 11/04/2015   Procedure: [place] IMPEDANCE STUDY;  Surgeon: Laurence Spates, MD;  Location: [facility];  Service: Endoscopy;  Laterality: N/A;  . VAGINAL HYSTERECTOMY      There were no vitals filed for this visit.  Subjective Assessment - 05/16/17 1423    Subjective   I think it is little better - not as tender and sore - now that I am thinking did not use my forearm straps the last 2 days     Patient Stated Goals  I want to have the pain better in my elbow so I can do workouts again     Currently in Pain?  Yes    Pain Score  3     Pain Location  Elbow    Pain Orientation  Left    Pain Descriptors / Indicators  Tender;Aching;Sore    Pain Type  Acute pain    Pain Onset  More than a month ago  Soft tissue mobs done after contrast - Graston tool nr 2 for sweeping over dorsal forearm - and massage manual cross friction over lateral epicondyle  Pt to only do 2-3 min at time  And can do contrast at home  Cont AROM wrist flexion with elbow to side  - tried close fist or  Extended arm - but increase pain to 5-6/10    ionto done - skin check done prior  Pt to keep patch on for about hour afterwards   to cont with modify act to pick up with palm and  Counter force splint to wear            OT Treatments/Exercises (OP) - 05/16/17 0001      Iontophoresis   Type of Iontophoresis  Dexamethasone    Location  L lateral epicondyle     Dose  med patch, current 1.4 and thn increase to 1.8       LUE Contrast Bath   Time  11 minutes    Comments  prior to  soft tissue              OT Education - 05/16/17 1429    Education provided  Yes    Education Details  HEP and wearing of counter force - and do not do any band exercises for core     Person(s) Educated  Patient    Methods  Demonstration    Comprehension  Verbalized understanding;Returned demonstration       OT Short Term Goals - 05/01/17 1546      OT SHORT TERM GOAL #1   Title  Pain on L elbow on PREE decrease more than 20 points     Baseline  pain at eval on PREE 40/50    Time  4    Period  Weeks    Status  New    Target Date  05/29/17      OT SHORT TERM GOAL #2   Title  Pt to be independent in HEP to wear splint , decrease pain and maintain and increase ROM and strength in L elbow     Baseline  no splint or knowledge of HEP     Time  3    Period  Weeks    Status  New    Target Date  05/22/17        OT Long Term Goals - 05/01/17 1548      OT LONG TERM GOAL #1   Title  Pt use of L arm improve for function on PREE score by more than  15 points     Baseline  PREE score for function 29/50     Time  6    Period  Weeks    Status  New    Target Date  06/12/17      OT LONG TERM GOAL #2   Title  L elbow and grip strength improve to WNL for pt and in range for her age to use in all ADL's and IADL's without increase symptoms     Baseline  pain 4-9 /10 pain with functional use             Plan - 05/16/17 1430    Clinical Impression Statement  Pt's pain decrease since eval - but still having hard time to advance stretches - can only do elbow to side - wrist flexion with open hand actively - with hand in fist and or extended arm increase to more than 5-6/10 -  Occupational performance deficits (Please refer to evaluation for details):  ADL's;IADL's;Work;Play;Leisure    Rehab Potential  Good    OT Frequency  2x / week    OT Duration  6 weeks    OT Treatment/Interventions  Iontophoresis;Self-care/ADL training;Patient/family  education;Splinting;Cryotherapy;Ultrasound;Manual Therapy;Passive range of motion;Therapeutic exercise    Plan  assess pain improve and use of counter force splint  and same HEP  and ionto 4th session next time     Clinical Decision Making  Several treatment options, min-mod task modification necessary    OT Home Exercise Plan  see pt instruction    Consulted and Agree with Plan of Care  Patient       Patient will benefit from skilled therapeutic intervention in order to improve the following deficits and impairments:  Impaired flexibility, Pain, Decreased strength, Decreased range of motion, Impaired UE functional use, Decreased knowledge of precautions  Visit Diagnosis: Pain in left elbow  Stiffness of left wrist, not elsewhere classified  Muscle weakness (generalized)    Problem List Patient Active Problem List   Diagnosis Date Noted  . Perennial allergic rhinitis with a nonallergic component 01/31/2017  . Asthmatic bronchitis 01/31/2017  . History of food allergy 01/31/2017  . Chest pain 12/13/2012  . Dyspnea 12/13/2012    Rosalyn Gess OTR/L,CLT 05/16/2017, 2:38 PM  [facility] [address] [place], [address] Phone: [phone]   Fax:  [phone]  Name: Karen [name] MRN: [id number] Date of Birth: 02-06-1970

## 2017-05-16 NOTE — Patient Instructions (Signed)
Cont the counter force splint   contrast   stretch active with elbow to side  And modify act

## 2017-05-17 DIAGNOSIS — J209 Acute bronchitis, unspecified: Secondary | ICD-10-CM | POA: Diagnosis not present

## 2017-05-17 MED FILL — AZITHROMYCIN 250 MG TABLET: 250 | 5 days supply | Qty: 6 | Fill #0

## 2017-05-17 NOTE — Addendum Note (Signed)
Addended by: Lester [place] A on: 05/17/2017 01:43 PM   Modules accepted: Orders

## 2017-05-17 NOTE — Telephone Encounter (Signed)
Pt called stating that there has still been condensation in her mask no longer in the tube. Pt is still having the chest pain pt is unsure if Dr. Rexene Alberts will need to decrease the pressure on her auto-pap please call to advise.

## 2017-05-17 NOTE — Telephone Encounter (Signed)
I called pt. She saw her PCP today for her chest discomfort. Pt reports that PCP told her that the discomfort does not seem to be cardiac related, and suspects a possible infection. Pt was given a z-pak RX. Pt reports condensation in the tubing of her cpap. I encouraged her to speak with [facility] regarding this. I also encouraged pt to practice proper cleaning process with her cpap to prevent any recurrent infections that may be caused by cpap use, as directed by [facility]. Pt is wanting to try a reduced pressure to see if this alleviates some of her symptoms.   I spoke with Dr. Rexene Alberts, who gave VO for pressure change to 5-9 cm H2O. Order sent to [facility].

## 2017-05-18 ENCOUNTER — Ambulatory Visit: Payer: 59 | Admitting: Occupational Therapy

## 2017-05-18 DIAGNOSIS — M25632 Stiffness of left wrist, not elsewhere classified: Secondary | ICD-10-CM | POA: Diagnosis not present

## 2017-05-18 DIAGNOSIS — M6281 Muscle weakness (generalized): Secondary | ICD-10-CM | POA: Diagnosis not present

## 2017-05-18 DIAGNOSIS — M25522 Pain in left elbow: Secondary | ICD-10-CM | POA: Diagnosis not present

## 2017-05-18 DIAGNOSIS — R278 Other lack of coordination: Secondary | ICD-10-CM | POA: Diagnosis not present

## 2017-05-18 NOTE — Therapy (Signed)
[facility] [address], [place], [address] Phone: [phone]   Fax:  [phone]  Occupational Therapy Treatment  Patient Details  Name: Karen [name] MRN: [id number] Date of Birth: 07-17-1970 Referring Provider: Vertis Kelch   Encounter Date: 05/18/2017  OT End of Session - 05/18/17 1238    Visit Number  5    Number of Visits  12    Date for OT Re-Evaluation  06/12/17    OT Start Time  0840    OT Stop Time  0943    OT Time Calculation (min)  63 min    Activity Tolerance  Patient tolerated treatment well    Behavior During Therapy  [place] [facility] for tasks assessed/performed       Past Medical History:  Diagnosis Date  . Adenomyosis   . Arthritis 2013   L knee gets steroid injections   . Asthmatic bronchitis 01/31/2017  . DVT of lower extremity (deep venous thrombosis) ([place])   . Dysmenorrhea   . Endometriosis   . Fibroids   . GERD (gastroesophageal reflux disease)   . Kidney stones   . Menorrhagia   . Migraine   . Recurrent upper respiratory infection (URI)   . Sebaceous cyst of breast, right lower inner quadrant 10/25/2012   Excised 11/21/12   . Syncope, non cardiac     Past Surgical History:  Procedure Laterality Date  . ARTHROSCOPIC HAGLUNDS REPAIR    . BREAST CYST EXCISION Right 11/21/2012   Procedure: CYST EXCISION BREAST;  Surgeon: Haywood Lasso, MD;  Location: [place];  Service: General;  Laterality: Right;  . BREAST EXCISIONAL BIOPSY Right 11/2012  . CESAREAN SECTION    . CHOLECYSTECTOMY    . COLONOSCOPY  05/02/2011   Procedure: COLONOSCOPY;  Surgeon: Winfield Cunas., MD;  Location: [facility];  Service: Endoscopy;  Laterality: N/A;  . colonoscopy  11/04/2014  . ESOPHAGEAL MANOMETRY N/A 11/04/2015   Procedure: ESOPHAGEAL MANOMETRY (EM);  Surgeon: Laurence Spates, MD;  [facility]: [facility];  Service: Endoscopy;  Laterality: N/A;  . ESOPHAGOGASTRODUODENOSCOPY   05/02/2011   Procedure: ESOPHAGOGASTRODUODENOSCOPY (EGD);  Surgeon: Winfield Cunas., MD;  Location: [facility];  Service: Endoscopy;  Laterality: N/A;  . ESOPHAGOGASTRODUODENOSCOPY N/A 09/01/2014   Procedure: ESOPHAGOGASTRODUODENOSCOPY (EGD);  Surgeon: Laurence Spates, MD;  Location: [facility];  Service: Endoscopy;  Laterality: N/A;  . KNEE SURGERY    . LAPAROSCOPY    . laproscopic abdominal    . [place] IMPEDANCE STUDY N/A 11/04/2015   Procedure: [place] IMPEDANCE STUDY;  Surgeon: Laurence Spates, MD;  Location: [facility];  Service: Endoscopy;  Laterality: N/A;  . VAGINAL HYSTERECTOMY      There were no vitals filed for this visit.  Subjective Assessment - 05/18/17 1235    Subjective   I seen the Dr yesterday - I still not feeling good- don't know if it is my stomach , pneumonia - I started this past week my Cpap machine - she put my on antibiotic     Patient Stated Goals  I want to have the pain better in my elbow so I can do workouts again     Currently in Pain?  Yes    Pain Score  4     Pain Location  Elbow    Pain Orientation  Left    Pain Descriptors / Indicators  Tender;Aching;Sore    Pain Type  Acute pain    Pain Onset  More  than a month ago     Pain little more increase this date - but she is not feeling good over all since earlier this week - Assess and ed pt on application of counter force splint - pain without 5/10 and with 3/10 per pt picking up 2 lbs palm down   Soft tissue mobs done after contrast - Graston tool nr 2 for sweeping over dorsal forearm - and massage manual cross friction over lateral epicondyle  Pt to only do 2-3 min at time  And can do contrast at home  Cont AROM wrist flexion with elbow to side- this date could tolerate  close fist with elbow to side - 5 reps   hold 5 sec   ionto done - skin check done prior Pt to keep patch on for about hourafterwards   to cont with modify act to pick up with palm and  Counter force splint to wear            skin check done prior and pt report she hit her elbow yesterday - pt was more tender this date at medication patch - pt to keep on for hour    OT Treatments/Exercises (OP) - 05/18/17 0001      Iontophoresis   Type of Iontophoresis  Dexamethasone    Location  L lateral epicondyle     Dose  med patch 0.8 and then 1.0 current     Time  35      LUE Contrast Bath   Time  11 minutes    Comments  prior to soft tissue mobs to forearm              OT Education - 05/18/17 1238    Education provided  Yes    Education Details  New stretches and review again counter force strap application     Person(s) Educated  Patient    Methods  Demonstration;Tactile cues    Comprehension  Verbalized understanding;Returned demonstration       OT Short Term Goals - 05/01/17 1546      OT SHORT TERM GOAL #1   Title  Pain on L elbow on PREE decrease more than 20 points     Baseline  pain at eval on PREE 40/50    Time  4    Period  Weeks    Status  New    Target Date  05/29/17      OT SHORT TERM GOAL #2   Title  Pt to be independent in HEP to wear splint , decrease pain and maintain and increase ROM and strength in L elbow     Baseline  no splint or knowledge of HEP     Time  3    Period  Weeks    Status  New    Target Date  05/22/17        OT Long Term Goals - 05/01/17 1548      OT LONG TERM GOAL #1   Title  Pt use of L arm improve for function on PREE score by more than  15 points     Baseline  PREE score for function 29/50     Time  6    Period  Weeks    Status  New    Target Date  06/12/17      OT LONG TERM GOAL #2   Title  L elbow and grip strength improve to WNL for pt and in range for her age  to use in all ADL's and IADL's without increase symptoms     Baseline  pain 4-9 /10 pain with functional use             Plan - 05/18/17 1239    Clinical Impression Statement  Pt was some what more tender this date to ionto - pt report she hit her elbow  yesterday and then on antibiotic - no feeling good the last few days - seen her family MD yesterday - was able to advance her extensor stretches this date to close fist elbow to side - cont with counter force splint and [employer]     Occupational performance deficits (Please refer to evaluation for details):  ADL's;IADL's;Work;Play;Leisure    Rehab Potential  Good    OT Frequency  2x / week    OT Duration  4 weeks    OT Treatment/Interventions  Iontophoresis;Self-care/ADL training;Patient/family education;Splinting;Cryotherapy;Ultrasound;Manual Therapy;Passive range of motion;Therapeutic exercise    Plan  assess pain and use of counter force splint  and application  and how new stretches doing   and ionto 5th session next time     Clinical Decision Making  Several treatment options, min-mod task modification necessary    OT Home Exercise Plan  see pt instruction    Consulted and Agree with Plan of Care  Patient       Patient will benefit from skilled therapeutic intervention in order to improve the following deficits and impairments:  Impaired flexibility, Pain, Decreased strength, Decreased range of motion, Impaired UE functional use, Decreased knowledge of precautions  Visit Diagnosis: Pain in left elbow  Stiffness of left wrist, not elsewhere classified  Muscle weakness (generalized)    Problem List Patient Active Problem List   Diagnosis Date Noted  . Perennial allergic rhinitis with a nonallergic component 01/31/2017  . Asthmatic bronchitis 01/31/2017  . History of food allergy 01/31/2017  . Chest pain 12/13/2012  . Dyspnea 12/13/2012    Rosalyn Gess OTR/L,CLT 05/18/2017, 12:43 PM  [facility] [address], [place], [address] Phone: [phone]   Fax:  [phone]  Name: Karen [name] MRN: [id number] Date of Birth: 01/13/1970

## 2017-05-18 NOTE — Patient Instructions (Signed)
Same but can do open and  close fist - elbow to side stretch for extensors for forearm- hold 5 sec - 5 reps after heat and massage  then ice massage  Cont counter force splint

## 2017-05-19 ENCOUNTER — Ambulatory Visit: Payer: 59 | Admitting: Occupational Therapy

## 2017-05-22 ENCOUNTER — Ambulatory Visit: Payer: 59 | Admitting: Physical Therapy

## 2017-05-22 DIAGNOSIS — Z0000 Encounter for general adult medical examination without abnormal findings: Secondary | ICD-10-CM | POA: Diagnosis not present

## 2017-05-22 DIAGNOSIS — J4520 Mild intermittent asthma, uncomplicated: Secondary | ICD-10-CM | POA: Diagnosis not present

## 2017-05-22 DIAGNOSIS — E049 Nontoxic goiter, unspecified: Secondary | ICD-10-CM | POA: Diagnosis not present

## 2017-05-22 DIAGNOSIS — K219 Gastro-esophageal reflux disease without esophagitis: Secondary | ICD-10-CM | POA: Diagnosis not present

## 2017-05-22 DIAGNOSIS — G4733 Obstructive sleep apnea (adult) (pediatric): Secondary | ICD-10-CM | POA: Diagnosis not present

## 2017-05-22 DIAGNOSIS — Z1211 Encounter for screening for malignant neoplasm of colon: Secondary | ICD-10-CM | POA: Diagnosis not present

## 2017-05-22 DIAGNOSIS — Z1231 Encounter for screening mammogram for malignant neoplasm of breast: Secondary | ICD-10-CM | POA: Diagnosis not present

## 2017-05-22 DIAGNOSIS — K582 Mixed irritable bowel syndrome: Secondary | ICD-10-CM | POA: Diagnosis not present

## 2017-05-23 ENCOUNTER — Ambulatory Visit: Payer: 59 | Admitting: Allergy and Immunology

## 2017-05-23 ENCOUNTER — Encounter: Payer: Self-pay | Admitting: Allergy and Immunology

## 2017-05-23 VITALS — BP 118/78 | HR 75 | Temp 97.9°F | Resp 16 | Ht 67.0 in | Wt 205.6 lb

## 2017-05-23 DIAGNOSIS — J4531 Mild persistent asthma with (acute) exacerbation: Secondary | ICD-10-CM

## 2017-05-23 DIAGNOSIS — R198 Other specified symptoms and signs involving the digestive system and abdomen: Secondary | ICD-10-CM

## 2017-05-23 DIAGNOSIS — J3089 Other allergic rhinitis: Secondary | ICD-10-CM

## 2017-05-23 MED ORDER — MONTELUKAST SODIUM 10 MG PO TABS: 10.0000 mg | ORAL_TABLET | Freq: Every day | ORAL | 5 refills | Status: DC

## 2017-05-23 MED FILL — MONTELUKAST SOD 10 MG TAB: 10 | 30 days supply | Qty: 30 | Fill #0

## 2017-05-23 NOTE — Assessment & Plan Note (Signed)
   A prescription has been provided for montelukast 10 mg daily at bedtime.  During respiratory tract infections or asthma flares, add Flovent 220g 2 inhalations via spacer device 2 times per day until symptoms have returned to baseline.  Continue albuterol HFA, 1-2 inhalations every 6 hours if needed.  Subjective and objective measures of pulmonary function will be followed and the treatment plan will be adjusted accordingly.

## 2017-05-23 NOTE — Assessment & Plan Note (Signed)
Unclear etiology.   Continue to keep a careful symptom/food journal and eliminate any food suspected of correlating with symptoms. Should symptoms concerning for anaphylaxis arise, 911 is to be called immediately.  If GI symptoms persist or progress, further gastroenterologist evaluation may be warranted.

## 2017-05-23 NOTE — Patient Instructions (Signed)
Asthmatic bronchitis  A prescription has been provided for montelukast 10 mg daily at bedtime.  During respiratory tract infections or asthma flares, add Flovent 220g 2 inhalations via spacer device 2 times per day until symptoms have returned to baseline.  Continue albuterol HFA, 1-2 inhalations every 6 hours if needed.  Subjective and objective measures of pulmonary function will be followed and the treatment plan will be adjusted accordingly.  Perennial allergic rhinitis with a nonallergic component  Continue appropriate allergen avoidance measures, nasal saline irrigation as needed, azelastine nasal spray if needed, and carbinoxamine if needed.  GI symptoms Unclear etiology.   Continue to keep a careful symptom/food journal and eliminate any food suspected of correlating with symptoms. Should symptoms concerning for anaphylaxis arise, 911 is to be called immediately.  If GI symptoms persist or progress, further gastroenterologist evaluation may be warranted.   Return in about 5 months (around 10/23/2017), or if symptoms worsen or fail to improve.

## 2017-05-23 NOTE — Assessment & Plan Note (Signed)
   Continue appropriate allergen avoidance measures, nasal saline irrigation as needed, azelastine nasal spray if needed, and carbinoxamine if needed.

## 2017-05-23 NOTE — Progress Notes (Signed)
Follow-up Note  RE: Karen [name] MRN: [id number] DOB: [birth year] Date of Office Visit: 05/23/2017  Primary care provider: Leeroy Cha, MD Referring provider: Leeroy Cha,*  History of present illness: Karen [name] is a 47 y.o. female with asthma, allergic rhinitis, and history of food allergy presenting for a sick visit.  She was previously seen in this clinic for her initial evaluation on January 31, 2017.  She reports that she was recently diagnosed with mild sleep apnea and was started on AssistPAP.  Since starting treatment on May 12 she has experienced some chest pressure, coughing, fatigue, as well as abdominal bloating.  She was started on a Z-Pak by her primary care physician without perceived benefit.  She tried to add Flovent for a few days but believes that this medication contributes to her diarrhea and constipation and therefore did not continue with the Flovent.  She feels that multiple foods and medications are triggering her constipation and diarrhea.  Assessment and plan: Asthmatic bronchitis  A prescription has been provided for montelukast 10 mg daily at bedtime.  During respiratory tract infections or asthma flares, add Flovent 220g 2 inhalations via spacer device 2 times per day until symptoms have returned to baseline.  Continue albuterol HFA, 1-2 inhalations every 6 hours if needed.  Subjective and objective measures of pulmonary function will be followed and the treatment plan will be adjusted accordingly.  Perennial allergic rhinitis with a nonallergic component  Continue appropriate allergen avoidance measures, nasal saline irrigation as needed, azelastine nasal spray if needed, and carbinoxamine if needed.  GI symptoms Unclear etiology.   Continue to keep a careful symptom/food journal and eliminate any food suspected of correlating with symptoms. Should symptoms concerning for anaphylaxis arise, 911 is to be called  immediately.  If GI symptoms persist or progress, further gastroenterologist evaluation may be warranted.   Meds ordered this encounter  Medications  . montelukast (SINGULAIR) 10 MG tablet    Sig: Take 1 tablet (10 mg total) by mouth at bedtime.    Dispense:  30 tablet    Refill:  5    Diagnostics: Spirometry reveals an FVC of 2.32 L and an FEV1 of 2.05 L (78% predicted).  Please see scanned spirometry results for details.    Physical examination: Blood pressure 118/78, pulse 75, temperature 97.9 F (36.6 C), temperature source Oral, resp. rate 16, height 5\' 7"  (1.702 m), weight 205 lb 9.6 oz (93.3 kg), SpO2 97 %.  General: Alert, interactive, in no acute distress. HEENT: TMs pearly gray, turbinates mildly edematous without discharge, post-pharynx unremarkable. Neck: Supple without lymphadenopathy. Lungs: Clear to auscultation without wheezing, rhonchi or rales. CV: Normal S1, S2 without murmurs. Skin: Warm and dry, without lesions or rashes.  The following portions of the patient's history were reviewed and updated as appropriate: allergies, current medications, past family history, past medical history, past social history, past surgical history and problem list.  Allergies as of 05/23/2017   No Known Allergies     Medication List        Accurate as of 05/23/17  6:01 PM. Always use your most recent med list.          ACZONE 7.5 % Gel Generic drug:  Dapsone Apply topically.   albuterol 108 (90 Base) MCG/ACT inhaler Commonly known as:  PROAIR HFA Inhale 2 puffs into the lungs every 4 (four) hours as needed for wheezing or shortness of breath.   azelastine 0.1 % nasal spray Commonly known as:  ASTELIN Place 2 sprays into both nostrils 2 (two) times daily.   calcium-vitamin D 250-100 MG-UNIT tablet Take 1 tablet by mouth daily.   Carbinoxamine Maleate 6 MG Tabs Commonly known as:  RYVENT Take 6 mg by mouth every 6 (six) hours as needed (every 6-8 hours as  needed).   clindamycin 1 % gel Commonly known as:  CLINDAGEL Apply topically 2 (two) times daily.   dexlansoprazole 60 MG capsule Commonly known as:  DEXILANT Take 60 mg by mouth daily.   ibuprofen 200 MG tablet Commonly known as:  ADVIL,MOTRIN Take 200 mg by mouth every 6 (six) hours as needed for moderate pain.   levocetirizine 5 MG tablet Commonly known as:  XYZAL Take 1 tablet (5 mg total) by mouth every evening.   montelukast 10 MG tablet Commonly known as:  SINGULAIR Take 1 tablet (10 mg total) by mouth at bedtime.   MULTIVITAMIN & MINERAL PO Take 1 tablet by mouth daily.   ondansetron 8 MG disintegrating tablet Commonly known as:  ZOFRAN-ODT Take 8 mg by mouth every 8 (eight) hours as needed for nausea or vomiting.   PROBIOTIC PO Take 1 capsule by mouth 4 (four) times daily as needed (for GI upset).   RELISTOR 150 MG Tabs Generic drug:  Methylnaltrexone Bromide Take 2 tablets by mouth 1 day or 1 dose.   vitamin B-12 1000 MCG tablet Commonly known as:  CYANOCOBALAMIN Take 1,000 mcg by mouth daily.   XIIDRA 5 % Soln Generic drug:  Lifitegrast Apply to eye.       No Known Allergies  Review of systems: Review of systems negative except as noted in HPI / PMHx or noted below: Constitutional: Negative.  HENT: Negative.   Eyes: Negative.  Respiratory: Negative.   Cardiovascular: Negative.  Gastrointestinal: Negative.  Genitourinary: Negative.  Musculoskeletal: Negative.  Neurological: Negative.  Endo/Heme/Allergies: Negative.  Cutaneous: Negative.  Past Medical History:  Diagnosis Date  . Adenomyosis   . Arthritis 2013   L knee gets steroid injections   . Asthmatic bronchitis 01/31/2017  . DVT of lower extremity (deep venous thrombosis) ([place])   . Dysmenorrhea   . Endometriosis   . Fibroids   . GERD (gastroesophageal reflux disease)   . Kidney stones   . Menorrhagia   . Migraine   . Recurrent upper respiratory infection (URI)   . Sebaceous  cyst of breast, right lower inner quadrant 10/25/2012   Excised 11/21/12   . Syncope, non cardiac     Family History  Problem Relation Age of Onset  . Colon cancer Father   . Hypertension Father   . Stroke Father   . Heart disease Father   . Heart attack Father   . Colon cancer Mother   . Diabetes Maternal Uncle   . Stroke Paternal Grandmother   . Heart attack Paternal Grandmother   . Heart attack Maternal Grandmother   . Heart attack Maternal Grandfather   . Cancer Maternal Uncle   . Breast cancer Neg Hx     Social History   Socioeconomic History  . Marital status: Married    Spouse name: Not on file  . Number of children: Not on file  . Years of education: Not on file  . Highest education level: Not on file  Occupational History  . Not on file  Social Needs  . Financial resource strain: Not on file  . Food insecurity:    Worry: Not on file    Inability: Not on file  .  Transportation needs:    Medical: Not on file    Non-medical: Not on file  Tobacco Use  . Smoking status: Never Smoker  . Smokeless tobacco: Never Used  Substance and Sexual Activity  . Alcohol use: Yes    Comment: occasionally  . Drug use: No  . Sexual activity: Not on file    Comment: married   Lifestyle  . Physical activity:    Days per week: Not on file    Minutes per session: Not on file  . Stress: Not on file  Relationships  . Social connections:    Talks on phone: Not on file    Gets together: Not on file    Attends religious service: Not on file    Active member of club or organization: Not on file    Attends meetings of clubs or organizations: Not on file    Relationship status: Not on file  . Intimate partner violence:    Fear of current or ex partner: Not on file    Emotionally abused: Not on file    Physically abused: Not on file    Forced sexual activity: Not on file  Other Topics Concern  . Not on file  Social History Narrative  . Not on file     I appreciate the  opportunity to take part in Cheril's care. Please do not hesitate to contact me with questions.  Sincerely,   R. Edgar Frisk, MD

## 2017-05-24 ENCOUNTER — Ambulatory Visit: Payer: 59 | Admitting: Occupational Therapy

## 2017-05-26 ENCOUNTER — Ambulatory Visit: Payer: 59 | Admitting: Occupational Therapy

## 2017-05-26 DIAGNOSIS — M25632 Stiffness of left wrist, not elsewhere classified: Secondary | ICD-10-CM

## 2017-05-26 DIAGNOSIS — M6281 Muscle weakness (generalized): Secondary | ICD-10-CM

## 2017-05-26 DIAGNOSIS — R278 Other lack of coordination: Secondary | ICD-10-CM | POA: Diagnosis not present

## 2017-05-26 DIAGNOSIS — M25522 Pain in left elbow: Secondary | ICD-10-CM

## 2017-05-26 NOTE — Patient Instructions (Signed)
Add some gentle extensor stretch to forearm - hand in fist -5 x 5 sec  And otherwise same and  Act modifications

## 2017-05-26 NOTE — Therapy (Signed)
[facility] [address], [place], [address] Phone: [phone]   Fax:  [phone]  Occupational Therapy Treatment  Patient Details  Name: Karen [name] MRN: [id number] Date of Birth: May 16, 1970 Referring Provider: Vertis Kelch   Encounter Date: 05/26/2017  OT End of Session - 05/26/17 1243    Visit Number  6    Number of Visits  12    Date for OT Re-Evaluation  06/12/17    OT Start Time  1128    OT Stop Time  1223    OT Time Calculation (min)  55 min    Activity Tolerance  Patient tolerated treatment well    Behavior During Therapy  [facility] for tasks assessed/performed       Past Medical History:  Diagnosis Date  . Adenomyosis   . Arthritis 2013   L knee gets steroid injections   . Asthmatic bronchitis 01/31/2017  . DVT of lower extremity (deep venous thrombosis) ([place])   . Dysmenorrhea   . Endometriosis   . Fibroids   . GERD (gastroesophageal reflux disease)   . Kidney stones   . Menorrhagia   . Migraine   . Recurrent upper respiratory infection (URI)   . Sebaceous cyst of breast, right lower inner quadrant 10/25/2012   Excised 11/21/12   . Syncope, non cardiac     Past Surgical History:  Procedure Laterality Date  . ARTHROSCOPIC HAGLUNDS REPAIR    . BREAST CYST EXCISION Right 11/21/2012   Procedure: CYST EXCISION BREAST;  Surgeon: Haywood Lasso, MD;  Location: [facility];  Service: General;  Laterality: Right;  . BREAST EXCISIONAL BIOPSY Right 11/2012  . CESAREAN SECTION    . CHOLECYSTECTOMY    . COLONOSCOPY  05/02/2011   Procedure: COLONOSCOPY;  Surgeon: Winfield Cunas., MD;  Location: [facility];  Service: Endoscopy;  Laterality: N/A;  . colonoscopy  11/04/2014  . ESOPHAGEAL MANOMETRY N/A 11/04/2015   Procedure: ESOPHAGEAL MANOMETRY (EM);  Surgeon: Laurence Spates, MD;  Location: [facility];  Service: Endoscopy;  Laterality: N/A;  . ESOPHAGOGASTRODUODENOSCOPY   05/02/2011   Procedure: ESOPHAGOGASTRODUODENOSCOPY (EGD);  Surgeon: Winfield Cunas., MD;  Location: [facility];  Service: Endoscopy;  Laterality: N/A;  . ESOPHAGOGASTRODUODENOSCOPY N/A 09/01/2014   Procedure: ESOPHAGOGASTRODUODENOSCOPY (EGD);  Surgeon: Laurence Spates, MD;  Location: [facility];  Service: Endoscopy;  Laterality: N/A;  . KNEE SURGERY    . LAPAROSCOPY    . laproscopic abdominal    . [place] IMPEDANCE STUDY N/A 11/04/2015   Procedure: [place] IMPEDANCE STUDY;  Surgeon: Laurence Spates, MD;  Location: [facility];  Service: Endoscopy;  Laterality: N/A;  . VAGINAL HYSTERECTOMY      There were no vitals filed for this visit.  Subjective Assessment - 05/26/17 1240    Subjective   I don't feel good still - they ran some blood work - RBC was up - my elbow bothering me still - and I did wear my strap on elbow - my daughter in [place] and had to help her pack and get ready for her 10 wks trip     Patient Stated Goals  I want to have the pain better in my elbow so I can do workouts again     Currently in Pain?  Yes    Pain Score  4     Pain Location  Elbow    Pain Orientation  Left    Pain Descriptors / Indicators  Aching;Tender;Sore  Pain Type  Acute pain    Pain Onset  More than a month ago          Pain  And tenderness still present - pt arrive with counter force splint on  - but she is not feeling good over all  - RBC is up  Assess and ed pt on application of counter force splint - pain without 5/10 and with 3/10 per pt picking up 2 lbs palm down  Soft tissue mobs done after contrast - Graston tool nr 2 for sweeping over dorsal forearm - and massage manual cross friction over lateral epicondyle  Pt to only do 2-3 min at time  And can do contrast at home  Can do stretch with elbow to side and wrist flex - for extensor stretch   5 x 5 sec    ionto done - skin check done prior Pt to keep patch on for about hourafterwards  to cont with modify act to pick up with palm and   Counter force splint to wear   skin check done prior and pt report she hit her elbow yesterday - pt was more tender this date at medication patch - pt to keep on for hour      OT Treatments/Exercises (OP) -              OT Treatments/Exercises (OP) - 05/26/17 0001      Iontophoresis   Type of Iontophoresis  Dexamethasone    Location  L lateral epicondyle     Dose  med patch 2.0 current     Time  19      LUE Contrast Bath   Time  11 minutes    Comments  prior to soft tissue mobs              OT Education - 05/26/17 1243    Education provided  Yes    Education Details  HEP and new stretches     Person(s) Educated  Patient    Methods  Explanation;Demonstration    Comprehension  Returned demonstration       OT Short Term Goals - 05/01/17 1546      OT SHORT TERM GOAL #1   Title  Pain on L elbow on PREE decrease more than 20 points     Baseline  pain at eval on PREE 40/50    Time  4    Period  Weeks    Status  New    Target Date  05/29/17      OT SHORT TERM GOAL #2   Title  Pt to be independent in HEP to wear splint , decrease pain and maintain and increase ROM and strength in L elbow     Baseline  no splint or knowledge of HEP     Time  3    Period  Weeks    Status  New    Target Date  05/22/17        OT Long Term Goals - 05/01/17 1548      OT LONG TERM GOAL #1   Title  Pt use of L arm improve for function on PREE score by more than  15 points     Baseline  PREE score for function 29/50     Time  6    Period  Weeks    Status  New    Target Date  06/12/17      OT LONG TERM GOAL #2  Title  L elbow and grip strength improve to WNL for pt and in range for her age to use in all ADL's and IADL's without increase symptoms     Baseline  pain 4-9 /10 pain with functional use             Plan - 05/26/17 1243    Clinical Impression Statement  Pt cont to have pain and tenderness over L lateral epicondyle- using strap -review again HEP and  upgrade stretches to passive with elbow to side and heat and ice - massage     Occupational performance deficits (Please refer to evaluation for details):  ADL's;IADL's;Work;Play;Leisure    Rehab Potential  Good    OT Frequency  2x / week    OT Duration  4 weeks    OT Treatment/Interventions  Iontophoresis;Self-care/ADL training;Patient/family education;Splinting;Cryotherapy;Ultrasound;Manual Therapy;Passive range of motion;Therapeutic exercise    Plan  assess pain and use of counter force splint  and application  and how new stretches doing   and ionto 6th session next time     Clinical Decision Making  Several treatment options, min-mod task modification necessary    OT Home Exercise Plan  see pt instruction    Consulted and Agree with Plan of Care  Patient       Patient will benefit from skilled therapeutic intervention in order to improve the following deficits and impairments:  Impaired flexibility, Pain, Decreased strength, Decreased range of motion, Impaired UE functional use, Decreased knowledge of precautions  Visit Diagnosis: Pain in left elbow  Stiffness of left wrist, not elsewhere classified  Muscle weakness (generalized)    Problem List Patient Active Problem List   Diagnosis Date Noted  . Perennial allergic rhinitis with a nonallergic component 01/31/2017  . Asthmatic bronchitis 01/31/2017  . GI symptoms 01/31/2017  . Chest pain 12/13/2012  . Dyspnea 12/13/2012    Rosalyn Gess OTR/l,CLT 05/26/2017, 12:45 PM  [facility] [address], [place], [address] Phone: [phone]   Fax:  [phone]  Name: Karen [name] MRN: [id number] Date of Birth: 07/31/1970

## 2017-05-30 ENCOUNTER — Ambulatory Visit: Payer: 59 | Admitting: Occupational Therapy

## 2017-05-30 DIAGNOSIS — M25522 Pain in left elbow: Secondary | ICD-10-CM

## 2017-05-30 DIAGNOSIS — M6281 Muscle weakness (generalized): Secondary | ICD-10-CM

## 2017-05-30 DIAGNOSIS — M25632 Stiffness of left wrist, not elsewhere classified: Secondary | ICD-10-CM

## 2017-05-30 DIAGNOSIS — R278 Other lack of coordination: Secondary | ICD-10-CM | POA: Diagnosis not present

## 2017-05-30 NOTE — Patient Instructions (Signed)
Same HEP -but stretches can be done now with extended arm

## 2017-05-30 NOTE — Therapy (Signed)
[facility] [address] [place], [address] Phone: [phone]   Fax:  [phone]  Occupational Therapy Treatment  Patient Details  Name: Karen [name] MRN: [id number] Date of Birth: 17-Aug-1970 Referring Provider: Vertis Kelch   Encounter Date: 05/30/2017  OT End of Session - 05/30/17 1713    Visit Number  7    Number of Visits  12    Date for OT Re-Evaluation  06/12/17    OT Start Time  1412    OT Stop Time  1444    OT Time Calculation (min)  32 min    Activity Tolerance  Patient tolerated treatment well    Behavior During Therapy  [facility] for tasks assessed/performed       Past Medical History:  Diagnosis Date  . Adenomyosis   . Arthritis 2013   L knee gets steroid injections   . Asthmatic bronchitis 01/31/2017  . DVT of lower extremity (deep venous thrombosis) ([place])   . Dysmenorrhea   . Endometriosis   . Fibroids   . GERD (gastroesophageal reflux disease)   . Kidney stones   . Menorrhagia   . Migraine   . Recurrent upper respiratory infection (URI)   . Sebaceous cyst of breast, right lower inner quadrant 10/25/2012   Excised 11/21/12   . Syncope, non cardiac     Past Surgical History:  Procedure Laterality Date  . ARTHROSCOPIC HAGLUNDS REPAIR    . BREAST CYST EXCISION Right 11/21/2012   Procedure: CYST EXCISION BREAST;  Surgeon: Haywood Lasso, MD;  Location: [facility];  Service: General;  Laterality: Right;  . BREAST EXCISIONAL BIOPSY Right 11/2012  . CESAREAN SECTION    . CHOLECYSTECTOMY    . COLONOSCOPY  05/02/2011   Procedure: COLONOSCOPY;  Surgeon: Winfield Cunas., MD;  Location: [facility];  Service: Endoscopy;  Laterality: N/A;  . colonoscopy  11/04/2014  . ESOPHAGEAL MANOMETRY N/A 11/04/2015   Procedure: ESOPHAGEAL MANOMETRY (EM);  Surgeon: Laurence Spates, MD;  [facility]: [facility];  Service: Endoscopy;  Laterality: N/A;  . ESOPHAGOGASTRODUODENOSCOPY   05/02/2011   Procedure: ESOPHAGOGASTRODUODENOSCOPY (EGD);  Surgeon: Winfield Cunas., MD;  Location: [facility];  Service: Endoscopy;  Laterality: N/A;  . ESOPHAGOGASTRODUODENOSCOPY N/A 09/01/2014   Procedure: ESOPHAGOGASTRODUODENOSCOPY (EGD);  Surgeon: Laurence Spates, MD;  Location: [facility];  Service: Endoscopy;  Laterality: N/A;  . KNEE SURGERY    . LAPAROSCOPY    . laproscopic abdominal    . [place] IMPEDANCE STUDY N/A 11/04/2015   Procedure: [place] IMPEDANCE STUDY;  Surgeon: Laurence Spates, MD;  [facility]: [facility];  Service: Endoscopy;  Laterality: N/A;  . VAGINAL HYSTERECTOMY      There were no vitals filed for this visit.  Subjective Assessment - 05/30/17 1710    Subjective   Feeling better- I slept over the weekend 12 hrs one night - my arm feel better - did the stretches and heat, ice and wore my strap -     Patient Stated Goals  I want to have the pain better in my elbow so I can do workouts again     Currently in Pain?  Yes    Pain Score  2     Pain Location  Elbow    Pain Orientation  Left    Pain Descriptors / Indicators  Aching;Tender;Sore    Pain Type  Acute pain    Pain Onset  More than a month ago  assess pain and tenderness -decrease to 2/10  Able to tolerate this date extended elbow stretch to forearm extensors - 5 x 5 sec  Can add passive stretch if pain under 2/10 - to do at home             OT Treatments/Exercises (OP) - 05/30/17 0001      Moist Heat Therapy   Number Minutes Moist Heat  5 Minutes    Moist Heat Location  Elbow prior to soft tissue       Iontophoresis   Type of Iontophoresis  Dexamethasone    Location  L lateral epicondyle     Dose  med patch 2.0 current     Time  19        Soft tissue mobs done after contrast - Graston tool nr 2 for sweeping over dorsal forearm - and massage manual cross friction over lateral epicondyle  Pt to only do 2-3 min at time  And can do contrast at home       OT Education - 05/30/17  1712    Education provided  Yes    Education Details  HEP with stretches extended arm     Person(s) Educated  Patient    Methods  Explanation;Demonstration    Comprehension  Verbalized understanding       OT Short Term Goals - 05/01/17 1546      OT SHORT TERM GOAL #1   Title  Pain on L elbow on PREE decrease more than 20 points     Baseline  pain at eval on PREE 40/50    Time  4    Period  Weeks    Status  New    Target Date  05/29/17      OT SHORT TERM GOAL #2   Title  Pt to be independent in HEP to wear splint , decrease pain and maintain and increase ROM and strength in L elbow     Baseline  no splint or knowledge of HEP     Time  3    Period  Weeks    Status  New    Target Date  05/22/17        OT Long Term Goals - 05/01/17 1548      OT LONG TERM GOAL #1   Title  Pt use of L arm improve for function on PREE score by more than  15 points     Baseline  PREE score for function 29/50     Time  6    Period  Weeks    Status  New    Target Date  06/12/17      OT LONG TERM GOAL #2   Title  L elbow and grip strength improve to WNL for pt and in range for her age to use in all ADL's and IADL's without increase symptoms     Baseline  pain 4-9 /10 pain with functional use             Plan - 05/30/17 1714    Clinical Impression Statement   Pt't pain decrease and tenderness this date - able to advance stretches to extended arm - and pt to cont with ice , heat and manual - and wear counter force splint     Occupational performance deficits (Please refer to evaluation for details):  ADL's;IADL's;Work;Play;Leisure    Rehab Potential  Good    OT Frequency  2x / week    OT Duration  4 weeks    OT Treatment/Interventions  Iontophoresis;Self-care/ADL training;Patient/family education;Splinting;Cryotherapy;Ultrasound;Manual Therapy;Passive range of motion;Therapeutic exercise    Plan  assess pain and use of counter force splint  and application  and how new stretches doing    and ionto 7th session next time     Clinical Decision Making  Several treatment options, min-mod task modification necessary    OT Home Exercise Plan  see pt instruction    Consulted and Agree with Plan of Care  Patient       Patient will benefit from skilled therapeutic intervention in order to improve the following deficits and impairments:  Impaired flexibility, Pain, Decreased strength, Decreased range of motion, Impaired UE functional use, Decreased knowledge of precautions  Visit Diagnosis: Pain in left elbow  Stiffness of left wrist, not elsewhere classified  Muscle weakness (generalized)    Problem List Patient Active Problem List   Diagnosis Date Noted  . Perennial allergic rhinitis with a nonallergic component 01/31/2017  . Asthmatic bronchitis 01/31/2017  . GI symptoms 01/31/2017  . Chest pain 12/13/2012  . Dyspnea 12/13/2012    Rosalyn Gess OTR/l,CLT 05/30/2017, 5:16 PM  [facility] [address], [place], [address] Phone: [phone]   Fax:  [phone]  Name: Karen [name] MRN: [id number] Date of Birth: Apr 09, 1970

## 2017-06-01 ENCOUNTER — Ambulatory Visit: Payer: 59 | Admitting: Occupational Therapy

## 2017-06-01 DIAGNOSIS — M6281 Muscle weakness (generalized): Secondary | ICD-10-CM

## 2017-06-01 DIAGNOSIS — R278 Other lack of coordination: Secondary | ICD-10-CM | POA: Diagnosis not present

## 2017-06-01 DIAGNOSIS — M25522 Pain in left elbow: Secondary | ICD-10-CM | POA: Diagnosis not present

## 2017-06-01 DIAGNOSIS — M25632 Stiffness of left wrist, not elsewhere classified: Secondary | ICD-10-CM | POA: Diagnosis not present

## 2017-06-01 NOTE — Therapy (Signed)
[facility] [address] [place], [address] Phone: [phone]   Fax:  [phone]  Occupational Therapy Treatment  Patient Details  Name: Karen [name] MRN: [id number] Date of Birth: 11/24/1970 Referring Provider: Vertis Kelch   Encounter Date: 06/01/2017  OT End of Session - 06/01/17 1712    Visit Number  8    Number of Visits  12    Date for OT Re-Evaluation  06/12/17    OT Start Time  1700    OT Stop Time  1759    OT Time Calculation (min)  59 min    Activity Tolerance  Patient tolerated treatment well    Behavior During Therapy  [facility] for tasks assessed/performed       Past Medical History:  Diagnosis Date  . Adenomyosis   . Arthritis 2013   L knee gets steroid injections   . Asthmatic bronchitis 01/31/2017  . DVT of lower extremity (deep venous thrombosis) ([place])   . Dysmenorrhea   . Endometriosis   . Fibroids   . GERD (gastroesophageal reflux disease)   . Kidney stones   . Menorrhagia   . Migraine   . Recurrent upper respiratory infection (URI)   . Sebaceous cyst of breast, right lower inner quadrant 10/25/2012   Excised 11/21/12   . Syncope, non cardiac     Past Surgical History:  Procedure Laterality Date  . ARTHROSCOPIC HAGLUNDS REPAIR    . BREAST CYST EXCISION Right 11/21/2012   Procedure: CYST EXCISION BREAST;  Surgeon: Haywood Lasso, MD;  Location: [place];  Service: General;  Laterality: Right;  . BREAST EXCISIONAL BIOPSY Right 11/2012  . CESAREAN SECTION    . CHOLECYSTECTOMY    . COLONOSCOPY  05/02/2011   Procedure: COLONOSCOPY;  Surgeon: Winfield Cunas., MD;  Location: [facility];  Service: Endoscopy;  Laterality: N/A;  . colonoscopy  11/04/2014  . ESOPHAGEAL MANOMETRY N/A 11/04/2015   Procedure: ESOPHAGEAL MANOMETRY (EM);  Surgeon: Laurence Spates, MD;  [facility]: [facility];  Service: Endoscopy;  Laterality: N/A;  . ESOPHAGOGASTRODUODENOSCOPY   05/02/2011   Procedure: ESOPHAGOGASTRODUODENOSCOPY (EGD);  Surgeon: Winfield Cunas., MD;  Location: [facility];  Service: Endoscopy;  Laterality: N/A;  . ESOPHAGOGASTRODUODENOSCOPY N/A 09/01/2014   Procedure: ESOPHAGOGASTRODUODENOSCOPY (EGD);  Surgeon: Laurence Spates, MD;  Location: [facility];  Service: Endoscopy;  Laterality: N/A;  . KNEE SURGERY    . LAPAROSCOPY    . laproscopic abdominal    . [place] IMPEDANCE STUDY N/A 11/04/2015   Procedure: [place] IMPEDANCE STUDY;  Surgeon: Laurence Spates, MD;  [facility];  Service: Endoscopy;  Laterality: N/A;  . VAGINAL HYSTERECTOMY      There were no vitals filed for this visit.  Subjective Assessment - 06/01/17 1709    Subjective   Doing okay - used it a lot - and pain still about 2/10 - I did do the stretches - the nearby one was little tough - but others was okay     Patient Stated Goals  I want to have the pain better in my elbow so I can do workouts again     Currently in Pain?  Yes    Pain Score  2     Pain Location  Elbow    Pain Orientation  Left    Pain Descriptors / Indicators  Aching;Tender    Pain Type  Acute pain    Pain Onset  1 to 4 weeks ago  assess pain and tenderness -decrease to 2/10  Over lat epicondyle Able to tolerate this date extended elbow stretch to forearm extensors - 5 x 5 sec both positions Can add passive stretch if pain under 2/10 - to do at home - does better after heat         OT Treatments/Exercises (OP) - 06/01/17 0001      Iontophoresis   Type of Iontophoresis  Dexamethasone    Location  L lateral epicondyle     Dose  med patch 1.8 current     Time  21      LUE Contrast Bath   Time  11 minutes    Comments  prior to  soft tissue mobs        a  Soft tissue mobs done after contrast - Graston tool nr 2 for sweeping over dorsal forearm - and massage manual cross friction over lateral epicondyle  And using graston brushing tool nr 2  Pt to only do 2-3 min at time  And  can do contrast at home      OT Education - 06/01/17 1712    Education provided  Yes    Education Details  stretches and modifications     Person(s) Educated  Patient    Methods  Explanation;Demonstration    Comprehension  Verbalized understanding       OT Short Term Goals - 05/01/17 1546      OT SHORT TERM GOAL #1   Title  Pain on L elbow on PREE decrease more than 20 points     Baseline  pain at eval on PREE 40/50    Time  4    Period  Weeks    Status  New    Target Date  05/29/17      OT SHORT TERM GOAL #2   Title  Pt to be independent in HEP to wear splint , decrease pain and maintain and increase ROM and strength in L elbow     Baseline  no splint or knowledge of HEP     Time  3    Period  Weeks    Status  New    Target Date  05/22/17        OT Long Term Goals - 05/01/17 1548      OT LONG TERM GOAL #1   Title  Pt use of L arm improve for function on PREE score by more than  15 points     Baseline  PREE score for function 29/50     Time  6    Period  Weeks    Status  New    Target Date  06/12/17      OT LONG TERM GOAL #2   Title  L elbow and grip strength improve to WNL for pt and in range for her age to use in all ADL's and IADL's without increase symptoms     Baseline  pain 4-9 /10 pain with functional use             Plan - 06/01/17 1713    Clinical Impression Statement  Pt's pain decreasing and able to do extended arm stretches for forearm extensors with less pain - cont wiht stretches , heat and ice , massage - pt show increase AROM after contrast     Occupational performance deficits (Please refer to evaluation for details):  ADL's;IADL's;Work;Play;Leisure    Rehab Potential  Good    OT Frequency  2x /  week    OT Duration  4 weeks    OT Treatment/Interventions  Iontophoresis;Self-care/ADL training;Patient/family education;Splinting;Cryotherapy;Ultrasound;Manual Therapy;Passive range of motion;Therapeutic exercise    Plan  assess pain and use  of counter force splint  and application  and how new stretches doing   and ionto 8th session next time     Clinical Decision Making  Several treatment options, min-mod task modification necessary    OT Home Exercise Plan  see pt instruction    Consulted and Agree with Plan of Care  Patient       Patient will benefit from skilled therapeutic intervention in order to improve the following deficits and impairments:  Impaired flexibility, Pain, Decreased strength, Decreased range of motion, Impaired UE functional use, Decreased knowledge of precautions  Visit Diagnosis: Stiffness of left wrist, not elsewhere classified  Muscle weakness (generalized)  Other lack of coordination  Pain in left elbow    Problem List Patient Active Problem List   Diagnosis Date Noted  . Perennial allergic rhinitis with a nonallergic component 01/31/2017  . Asthmatic bronchitis 01/31/2017  . GI symptoms 01/31/2017  . Chest pain 12/13/2012  . Dyspnea 12/13/2012    Rosalyn Gess OTR/l,CLT 06/01/2017, 6:36 PM  [facility] [address], [place], [address] Phone: [phone]   Fax:  [phone]  Name: Karen [name] MRN: [id number] Date of Birth: 28-Apr-1970

## 2017-06-01 NOTE — Patient Instructions (Signed)
Same

## 2017-06-06 ENCOUNTER — Ambulatory Visit: Payer: 59 | Attending: Internal Medicine | Admitting: Occupational Therapy

## 2017-06-06 DIAGNOSIS — M25522 Pain in left elbow: Secondary | ICD-10-CM | POA: Insufficient documentation

## 2017-06-06 DIAGNOSIS — R278 Other lack of coordination: Secondary | ICD-10-CM | POA: Insufficient documentation

## 2017-06-06 DIAGNOSIS — M25632 Stiffness of left wrist, not elsewhere classified: Secondary | ICD-10-CM | POA: Diagnosis not present

## 2017-06-06 DIAGNOSIS — M6281 Muscle weakness (generalized): Secondary | ICD-10-CM | POA: Insufficient documentation

## 2017-06-06 MED FILL — RELISTOR 150 MG TABLET: 150 | 30 days supply | Qty: 60 | Fill #0

## 2017-06-06 NOTE — Therapy (Signed)
[facility] [address] [place], [address] Phone: [phone]   Fax:  [phone]  Occupational Therapy Treatment  Patient Details  Name: Karen [name] MRN: [id number] Date of Birth: 1970-12-06 Referring Provider: Vertis Kelch   Encounter Date: 06/06/2017  OT End of Session - 06/06/17 1247    Visit Number  9    Number of Visits  12    Date for OT Re-Evaluation  06/12/17    OT Start Time  1020    OT Stop Time  1121    OT Time Calculation (min)  61 min    Activity Tolerance  Patient tolerated treatment well    Behavior During Therapy  [facility] for tasks assessed/performed       Past Medical History:  Diagnosis Date  . Adenomyosis   . Arthritis 2013   L knee gets steroid injections   . Asthmatic bronchitis 01/31/2017  . DVT of lower extremity (deep venous thrombosis) ([place])   . Dysmenorrhea   . Endometriosis   . Fibroids   . GERD (gastroesophageal reflux disease)   . Kidney stones   . Menorrhagia   . Migraine   . Recurrent upper respiratory infection (URI)   . Sebaceous cyst of breast, right lower inner quadrant 10/25/2012   Excised 11/21/12   . Syncope, non cardiac     Past Surgical History:  Procedure Laterality Date  . ARTHROSCOPIC HAGLUNDS REPAIR    . BREAST CYST EXCISION Right 11/21/2012   Procedure: CYST EXCISION BREAST;  Surgeon: Haywood Lasso, MD;  Location: [place];  Service: General;  Laterality: Right;  . BREAST EXCISIONAL BIOPSY Right 11/2012  . CESAREAN SECTION    . CHOLECYSTECTOMY    . COLONOSCOPY  05/02/2011   Procedure: COLONOSCOPY;  Surgeon: Winfield Cunas., MD;  Location: [facility];  Service: Endoscopy;  Laterality: N/A;  . colonoscopy  11/04/2014  . ESOPHAGEAL MANOMETRY N/A 11/04/2015   Procedure: ESOPHAGEAL MANOMETRY (EM);  Surgeon: Laurence Spates, MD;  [facility];  Service: Endoscopy;  Laterality: N/A;  . ESOPHAGOGASTRODUODENOSCOPY   05/02/2011   Procedure: ESOPHAGOGASTRODUODENOSCOPY (EGD);  Surgeon: Winfield Cunas., MD;  Location: [facility];  Service: Endoscopy;  Laterality: N/A;  . ESOPHAGOGASTRODUODENOSCOPY N/A 09/01/2014   Procedure: ESOPHAGOGASTRODUODENOSCOPY (EGD);  Surgeon: Laurence Spates, MD;  Location: [facility];  Service: Endoscopy;  Laterality: N/A;  . KNEE SURGERY    . LAPAROSCOPY    . laproscopic abdominal    . [place] IMPEDANCE STUDY N/A 11/04/2015   Procedure: [place] IMPEDANCE STUDY;  Surgeon: Laurence Spates, MD;  [facility];  Service: Endoscopy;  Laterality: N/A;  . VAGINAL HYSTERECTOMY      There were no vitals filed for this visit.  Subjective Assessment - 06/06/17 1036    Subjective   Doing better- I did my heat and ice little more and massage - as well as the stretches - pain not really - and maybe at the worse 2/10     Patient Stated Goals  I want to have the pain better in my elbow so I can do workouts again     Currently in Pain?  Yes    Pain Score  1     Pain Location  Elbow    Pain Orientation  Left    Pain Descriptors / Indicators  Aching;Tender    Pain Type  Acute pain         [facility] OT Assessment - 06/06/17  0001      Strength   Right Hand Grip (lbs)  55    Left Hand Grip (lbs)  52 46 extended arm        assess pain and tenderness -decrease to 0/10  Over lat epicondyle Able to tolerate this date extended elbow stretch to forearm extensors - 5 x 5 sec both positions- pull less than 2/10  Can add passive stretch if pain under 2/10 - to do at home- does better after heat  and extended arm position  No pain with middle fingers and wrist extention resistance test           OT Treatments/Exercises (OP) - 06/06/17 0001      Iontophoresis   Type of Iontophoresis  Dexamethasone    Location  L lateral epicondyle     Dose  med patch 1.2  current  and then increease to 2.0 latera     Time  30      LUE Contrast Bath   Time  11 minutes    Comments  prior to soft  tissue          Soft tissue mobs done after contrast - Graston tool nr 2 for sweeping over dorsal forearm  And proximal forearm some tightness - and massage manual cross friction over lateral epicondyle  And using graston brushing tool nr 2  Pt to only do 2-3 min at time  And can do contrast at home   extended arm passive stretch for wrist extensors - add - 5 reps  Hold 5 sec after heat  More flexion and less pain -        OT Education - 06/06/17 1247    Education provided  Yes    Education Details  progress and HEP    Person(s) Educated  Patient    Methods  Explanation;Demonstration    Comprehension  Verbalized understanding       OT Short Term Goals - 05/01/17 1546      OT SHORT TERM GOAL #1   Title  Pain on L elbow on PREE decrease more than 20 points     Baseline  pain at eval on PREE 40/50    Time  4    Period  Weeks    Status  New    Target Date  05/29/17      OT SHORT TERM GOAL #2   Title  Pt to be independent in HEP to wear splint , decrease pain and maintain and increase ROM and strength in L elbow     Baseline  no splint or knowledge of HEP     Time  3    Period  Weeks    Status  New    Target Date  05/22/17        OT Long Term Goals - 05/01/17 1548      OT LONG TERM GOAL #1   Title  Pt use of L arm improve for function on PREE score by more than  15 points     Baseline  PREE score for function 29/50     Time  6    Period  Weeks    Status  New    Target Date  06/12/17      OT LONG TERM GOAL #2   Title  L elbow and grip strength improve to WNL for pt and in range for her age to use in all ADL's and IADL's without increase symptoms  Baseline  pain 4-9 /10 pain with functional use             Plan - 06/06/17 1248    Clinical Impression Statement  Pt making good progress in pain , flexibility and strength in grip - pt to cont with HEP and stretches upgraded this dae -    Occupational performance deficits (Please refer to evaluation for  details):  ADL's;IADL's;Work;Play;Leisure    Rehab Potential  Good    OT Frequency  2x / week    OT Duration  2 weeks    OT Treatment/Interventions  Iontophoresis;Self-care/ADL training;Patient/family education;Splinting;Cryotherapy;Ultrasound;Manual Therapy;Passive range of motion;Therapeutic exercise    Plan  assess pain and use of counter force splint  and application  and how new stretches doing   and ionto 9th session next time     Clinical Decision Making  Several treatment options, min-mod task modification necessary    OT Home Exercise Plan  see pt instruction    Consulted and Agree with Plan of Care  Patient       Patient will benefit from skilled therapeutic intervention in order to improve the following deficits and impairments:  Impaired flexibility, Pain, Decreased strength, Decreased range of motion, Impaired UE functional use, Decreased knowledge of precautions  Visit Diagnosis: Stiffness of left wrist, not elsewhere classified  Muscle weakness (generalized)  Other lack of coordination  Pain in left elbow    Problem List Patient Active Problem List   Diagnosis Date Noted  . Perennial allergic rhinitis with a nonallergic component 01/31/2017  . Asthmatic bronchitis 01/31/2017  . GI symptoms 01/31/2017  . Chest pain 12/13/2012  . Dyspnea 12/13/2012    Rosalyn Gess OTR/L,CLT 06/06/2017, 12:51 PM  [facility] [address], [place], [address] Phone: [phone]   Fax:  [phone]  Name: NJERI VICENTE MRN: [id number] Date of Birth: 03-26-1970

## 2017-06-06 NOTE — Patient Instructions (Signed)
Same HEP - extended arm passive stretch for wrist extensors - add - 5 reps  Hold 5 sec after heat

## 2017-06-08 ENCOUNTER — Ambulatory Visit: Payer: 59 | Admitting: Occupational Therapy

## 2017-06-08 ENCOUNTER — Telehealth: Payer: Self-pay | Admitting: Neurology

## 2017-06-08 DIAGNOSIS — M6281 Muscle weakness (generalized): Secondary | ICD-10-CM

## 2017-06-08 DIAGNOSIS — M25632 Stiffness of left wrist, not elsewhere classified: Secondary | ICD-10-CM

## 2017-06-08 DIAGNOSIS — R278 Other lack of coordination: Secondary | ICD-10-CM | POA: Diagnosis not present

## 2017-06-08 DIAGNOSIS — M25522 Pain in left elbow: Secondary | ICD-10-CM

## 2017-06-08 NOTE — Therapy (Signed)
[facility] [address] [place], [address] Phone: [phone]   Fax:  [phone]  Occupational Therapy Treatment  Patient Details  Name: Karen [name] MRN: [id number] Date of Birth: Jan 27, 1970 Referring Provider: Vertis Kelch   Encounter Date: 06/08/2017  OT End of Session - 06/08/17 1536    Visit Number  10    Number of Visits  12    Date for OT Re-Evaluation  06/12/17    OT Start Time  1439    OT Stop Time  1538    OT Time Calculation (min)  59 min    Activity Tolerance  Patient tolerated treatment well    Behavior During Therapy  [facility] for tasks assessed/performed       Past Medical History:  Diagnosis Date  . Adenomyosis   . Arthritis 2013   L knee gets steroid injections   . Asthmatic bronchitis 01/31/2017  . DVT of lower extremity (deep venous thrombosis) ([place])   . Dysmenorrhea   . Endometriosis   . Fibroids   . GERD (gastroesophageal reflux disease)   . Kidney stones   . Menorrhagia   . Migraine   . Recurrent upper respiratory infection (URI)   . Sebaceous cyst of breast, right lower inner quadrant 10/25/2012   Excised 11/21/12   . Syncope, non cardiac     Past Surgical History:  Procedure Laterality Date  . ARTHROSCOPIC HAGLUNDS REPAIR    . BREAST CYST EXCISION Right 11/21/2012   Procedure: CYST EXCISION BREAST;  Surgeon: Haywood Lasso, MD;  Location: [place];  Service: General;  Laterality: Right;  . BREAST EXCISIONAL BIOPSY Right 11/2012  . CESAREAN SECTION    . CHOLECYSTECTOMY    . COLONOSCOPY  05/02/2011   Procedure: COLONOSCOPY;  Surgeon: Winfield Cunas., MD;  Location: [facility];  Service: Endoscopy;  Laterality: N/A;  . colonoscopy  11/04/2014  . ESOPHAGEAL MANOMETRY N/A 11/04/2015   Procedure: ESOPHAGEAL MANOMETRY (EM);  Surgeon: Laurence Spates, MD;  [facility];  Service: Endoscopy;  Laterality: N/A;  . ESOPHAGOGASTRODUODENOSCOPY   05/02/2011   Procedure: ESOPHAGOGASTRODUODENOSCOPY (EGD);  Surgeon: Winfield Cunas., MD;  Location: [facility];  Service: Endoscopy;  Laterality: N/A;  . ESOPHAGOGASTRODUODENOSCOPY N/A 09/01/2014   Procedure: ESOPHAGOGASTRODUODENOSCOPY (EGD);  Surgeon: Laurence Spates, MD;  Location: [facility];  Service: Endoscopy;  Laterality: N/A;  . KNEE SURGERY    . LAPAROSCOPY    . laproscopic abdominal    . [place] IMPEDANCE STUDY N/A 11/04/2015   Procedure: [place] IMPEDANCE STUDY;  Surgeon: Laurence Spates, MD;  [facility];  Service: Endoscopy;  Laterality: N/A;  . VAGINAL HYSTERECTOMY      There were no vitals filed for this visit.  Subjective Assessment - 06/08/17 1458    Subjective   Feeling better- was little bruise after last time and tender - tiny blisters- but otherwise did okay - pain getting better - still tender     Patient Stated Goals  I want to have the pain better in my elbow so I can do workouts again     Currently in Pain?  Yes    Pain Score  3     Pain Location  Elbow    Pain Orientation  Left    Pain Descriptors / Indicators  Aching    Pain Type  Acute pain    Pain Onset  1 to 4 weeks ago       No  pain upon arrival - but still tender about 2-3/10 with palpation over lat epicondyle   Able to tolerate this date extended elbow stretch to forearm extensors - 5 x 5 secboth positions- pull less than 1/10 after contrast   Cont to do passive stretch if pain under 2/10 - to do at home- does better after heat and extended arm position - composite wrist flexion  No pain with middle fingers and wrist extention resistance test           skin check - done  Had to redo medication patch - snip edges to keep away from 2 tiny small scabs from last ionto  OT Treatments/Exercises (OP) - 06/08/17 0001      Iontophoresis   Type of Iontophoresis  Dexamethasone    Location  L lateral epicondyle     Dose  med patch 1.8 intensity       LUE Contrast Bath   Time  11 minutes     Comments  prior to soft tissue        Soft tissue mobs done after contrast - Graston tool nr 2 for sweeping over dorsal forearm  And proximal forearm some tightness - and massage manual cross friction over lateral epicondyleAnd using graston brushing tool nr 2  pt report she bruised some last time - and had tiny blisters     extended arm passive stretch for wrist extensors - add - 5 reps  Hold 5 sec after heat         OT Education - 06/08/17 1536    Education provided  Yes    Education Details  progress and HEP    Person(s) Educated  Patient    Methods  Explanation;Demonstration    Comprehension  Verbalized understanding       OT Short Term Goals - 05/01/17 1546      OT SHORT TERM GOAL #1   Title  Pain on L elbow on PREE decrease more than 20 points     Baseline  pain at eval on PREE 40/50    Time  4    Period  Weeks    Status  New    Target Date  05/29/17      OT SHORT TERM GOAL #2   Title  Pt to be independent in HEP to wear splint , decrease pain and maintain and increase ROM and strength in L elbow     Baseline  no splint or knowledge of HEP     Time  3    Period  Weeks    Status  New    Target Date  05/22/17        OT Long Term Goals - 05/01/17 1548      OT LONG TERM GOAL #1   Title  Pt use of L arm improve for function on PREE score by more than  15 points     Baseline  PREE score for function 29/50     Time  6    Period  Weeks    Status  New    Target Date  06/12/17      OT LONG TERM GOAL #2   Title  L elbow and grip strength improve to WNL for pt and in range for her age to use in all ADL's and IADL's without increase symptoms     Baseline  pain 4-9 /10 pain with functional use             Plan -  06/08/17 1537    Clinical Impression Statement  Pt making good progress in pain , flexibility - pt to cont HEP for heat, massage, stretches - extended arm and composite wrist flexion - and ice - next session will be last 10th ionto - and then pt  to do HEP for another 3 wks and follow up     Occupational performance deficits (Please refer to evaluation for details):  ADL's;IADL's;Work;Play;Leisure    Rehab Potential  Good    OT Frequency  1x / week    OT Duration  2 weeks    OT Treatment/Interventions  Iontophoresis;Self-care/ADL training;Patient/family education;Splinting;Cryotherapy;Ultrasound;Manual Therapy;Passive range of motion;Therapeutic exercise    Plan  assess if pain decrease to less than 3/10 with manual - and do 10 th session - and to follow up in another 3 wks     Clinical Decision Making  Several treatment options, min-mod task modification necessary    OT Home Exercise Plan  see pt instruction    Consulted and Agree with Plan of Care  Patient       Patient will benefit from skilled therapeutic intervention in order to improve the following deficits and impairments:  Impaired flexibility, Pain, Decreased strength, Decreased range of motion, Impaired UE functional use, Decreased knowledge of precautions  Visit Diagnosis: Stiffness of left wrist, not elsewhere classified  Muscle weakness (generalized)  Other lack of coordination  Pain in left elbow    Problem List Patient Active Problem List   Diagnosis Date Noted  . Perennial allergic rhinitis with a nonallergic component 01/31/2017  . Asthmatic bronchitis 01/31/2017  . GI symptoms 01/31/2017  . Chest pain 12/13/2012  . Dyspnea 12/13/2012    Rosalyn Gess OTR/l,CLT  06/08/2017, 3:41 PM  [facility] [address] [place], [address] Phone: [phone]   Fax:  [phone]  Name: Karen [name] MRN: [id number] Date of Birth: 21-Mar-1970

## 2017-06-08 NOTE — Telephone Encounter (Signed)
Pt returned my call. She is agreeable to an appt with Hoyle Sauer, NP on 06/16/17 at 11:00am. She wants to know if Dr. Rexene Alberts recommends anything for her in the meantime, and if she thinks the chest pain and bloating could be coming from her auto pap, and for me to call her back with any further recommendations.

## 2017-06-08 NOTE — Telephone Encounter (Signed)
Pt states she is having problems with her Autopap and is asking for a call from [place] to discuss

## 2017-06-08 NOTE — Telephone Encounter (Signed)
I called pt. She is still having a dull, achy pain in her chest when she wakes up. She also feels very bloated upon awakening in the morning. She has met with her PCP who does not think the chest pain is cardiac related. She met with an allergist as well. She also met with [facility] and everything looks ok on their end. She is wondering if the chest pain/bloating could be related to auto pap therapy, since it seems that everyone else has ruled everything else out.   I offered pt an appt with Hoyle Sauer, NP on 06/16/17 at 11:00am with Hoyle Sauer, NP. Pt will call me back.

## 2017-06-08 NOTE — Telephone Encounter (Signed)
I called pt and explained this to her. She is agreeable to stopping the auto pap until her appt with Hoyle Sauer, NP on 06/16/17 to see if she feels better, as far as chest pain and bloating. She is agreeable to discussing alternative treatments for auto pap with Hoyle Sauer, NP at that appt, if pt decides that she cannot tolerate the auto pap. Pt also understands that her sleep apnea is well treated with the auto pap, however. I reminded pt of her appt with Dr. Rexene Alberts and to bring her auto pap. Pt verbalized understanding of the recommendations.

## 2017-06-08 NOTE — Telephone Encounter (Signed)
Her current AutoPap compliance is good, average usage of 5 hours and 48 minutes for the past 30 days. Her AHI was at goal at 2.7 per hour, leak is very low, average pressure for the 95th percentile is 9.1 cm. If she wants to stop the autoPAP until her appointment next week with Hoyle Sauer, she can see how she feels off of it as far as her bloating and chest pain are concerned. It could certainly be related to using AutoPap and swallowing too much air in the process.

## 2017-06-08 NOTE — Patient Instructions (Signed)
Same HEP  

## 2017-06-12 DIAGNOSIS — G4733 Obstructive sleep apnea (adult) (pediatric): Secondary | ICD-10-CM | POA: Diagnosis not present

## 2017-06-13 ENCOUNTER — Encounter: Payer: Self-pay | Admitting: Nurse Practitioner

## 2017-06-13 ENCOUNTER — Ambulatory Visit: Payer: 59 | Admitting: Occupational Therapy

## 2017-06-13 ENCOUNTER — Encounter: Payer: Self-pay | Admitting: Occupational Therapy

## 2017-06-13 DIAGNOSIS — M25632 Stiffness of left wrist, not elsewhere classified: Secondary | ICD-10-CM

## 2017-06-13 DIAGNOSIS — R278 Other lack of coordination: Secondary | ICD-10-CM

## 2017-06-13 DIAGNOSIS — M6281 Muscle weakness (generalized): Secondary | ICD-10-CM

## 2017-06-13 DIAGNOSIS — M25522 Pain in left elbow: Secondary | ICD-10-CM | POA: Diagnosis not present

## 2017-06-15 NOTE — Progress Notes (Addendum)
[facility]  PATIENT: KATILYNN SINKLER DOB: December 24, 1970   REASON FOR VISIT: Follow-up for newly diagnosed obstructive sleep apnea with CPAP follow-up HISTORY FROM:patient    HISTORY OF PRESENT ILLNESS:UPDATE 06/16/17 CM Ms. Skop, 47 year old female returns for follow-up for AutoPap.  She has a new diagnosis of mild obstructive sleep apnea.  She called into the office last week and she stopped her Pap due to abdominal bloating and chest pain.  On today's visit however she thinks the chest pain was due to her asthma and the abdominal bloating was due to her IBS.  She was advised to restart the machine for 1 week and see how she does.  Compliance data dated 05/15/2017-611/2019 shows compliance greater than 4 hours at 77%.  Average usage 6 hours of 1 minute.  Set pressure 5 to 9 cm.  EPR level 3 leaks 95th percentile 0.6.  AHI 2.8.  She returns for reevaluation    03/23/17 SAMs. Wigen is a 47 year old right-handed woman with an underlying medical history of mild intermittent asthma, irritable bowel syndrome, food intolerances, allergic rhinitis, interstitial cystitis, history of DVT in 1992, reflux disease, migraine headaches, history of vertigo, kidney stones, and borderline obesity, who reports snoring and excessive daytime somnolence as well as apneic pauses while asleep per husband's report. I reviewed your office note from 03/10/2017, which you kindly included. Her Epworth sleepiness score is 11 out of 24 today, fatigue score is 38/63. She has woken up with a sense of choking. She has had recurrent respiratory infections. She works as a Armed forces operational officer. She lives at home with her husband and 2 children. She is a nonsmoker and drinks alcohol rarely, maybe once a year or so. Caffeine several times a week but not necessarily daily. Had recent GI issues including irritable bowel syndrome, food intolerance, for the past year or 2. She has lost about 50 pounds. She also started  having recurrent respiratory infections, allergy flareups. She has seen an allergy specialist for this. She was started on new medications. She has been doing physical therapy for pelvic floor exercises. Bedtime is around 11 PM and she does not have significant difficulty falling asleep. She feels exhausted. Rise time is around 5 AM. She has nocturia every night. She has woken up with a headache. She has pauses in her breathing. She denies telltale symptoms of restless leg syndrome   REVIEW OF SYSTEMS: Full 14 system review of systems performed and notable only for those listed, all others are neg:  Constitutional: neg  Cardiovascular: neg Ear/Nose/Throat: neg  Skin: neg Eyes: neg Respiratory: Shortness of breath asthma Gastroitestinal: Irritable bowel syndrome Hematology/Lymphatic: neg  Endocrine: neg Musculoskeletal:neg Allergy/Immunology: Environmental allergies Neurological: neg Psychiatric: neg Sleep : Obstructive sleep apnea with AutoPap neg   ALLERGIES: Allergies  Allergen Reactions  . Molds & Smuts Cough    sneezing    HOME MEDICATIONS: Outpatient Medications Prior to Visit  Medication Sig Dispense Refill  . albuterol (PROAIR HFA) 108 (90 Base) MCG/ACT inhaler Inhale 2 puffs into the lungs every 4 (four) hours as needed for wheezing or shortness of breath. 1 Inhaler 3  . azelastine (ASTELIN) 0.1 % nasal spray Place 2 sprays into both nostrils 2 (two) times daily. 30 mL 5  . calcium-vitamin D 250-100 MG-UNIT per tablet Take 1 tablet by mouth daily.     . clindamycin (CLINDAGEL) 1 % gel Apply topically 2 (two) times daily.    . Dapsone (ACZONE) 7.5 % GEL Apply topically.    Marland Kitchen  dexlansoprazole (DEXILANT) 60 MG capsule Take 60 mg by mouth daily.    Marland Kitchen ibuprofen (ADVIL,MOTRIN) 200 MG tablet Take 200 mg by mouth every 6 (six) hours as needed for moderate pain.     Marland Kitchen levocetirizine (XYZAL) 5 MG tablet Take 1 tablet (5 mg total) by mouth every evening. 30 tablet 5  . Lifitegrast  (XIIDRA) 5 % SOLN Apply to eye.    . Methylnaltrexone Bromide (RELISTOR) 150 MG TABS Take 2 tablets by mouth 1 day or 1 dose.    . montelukast (SINGULAIR) 10 MG tablet Take 1 tablet (10 mg total) by mouth at bedtime. 30 tablet 5  . Multiple Vitamins-Minerals (MULTIVITAMIN & MINERAL PO) Take 1 tablet by mouth daily.     . ondansetron (ZOFRAN-ODT) 8 MG disintegrating tablet Take 8 mg by mouth every 8 (eight) hours as needed for nausea or vomiting.    . Probiotic Product (PROBIOTIC PO) Take 1 capsule by mouth 4 (four) times daily as needed (for GI upset).    . vitamin B-12 (CYANOCOBALAMIN) 1000 MCG tablet Take 1,000 mcg by mouth daily.    . Carbinoxamine Maleate (RYVENT) 6 MG TABS Take 6 mg by mouth every 6 (six) hours as needed (every 6-8 hours as needed). (Patient not taking: Reported on 05/23/2017) 120 tablet 3   No facility-administered medications prior to visit.     PAST MEDICAL HISTORY: Past Medical History:  Diagnosis Date  . Adenomyosis   . Arthritis 2013   L knee gets steroid injections   . Asthmatic bronchitis 01/31/2017  . DVT of lower extremity (deep venous thrombosis) ([place])   . Dysmenorrhea   . Endometriosis   . Fibroids   . GERD (gastroesophageal reflux disease)   . Kidney stones   . Menorrhagia   . Migraine   . Recurrent upper respiratory infection (URI)   . Sebaceous cyst of breast, right lower inner quadrant 10/25/2012   Excised 11/21/12   . Syncope, non cardiac     PAST SURGICAL HISTORY: Past Surgical History:  Procedure Laterality Date  . ARTHROSCOPIC HAGLUNDS REPAIR    . BREAST CYST EXCISION Right 11/21/2012   Procedure: CYST EXCISION BREAST;  Surgeon: Haywood Lasso, MD;  Location: [place];  Service: General;  Laterality: Right;  . BREAST EXCISIONAL BIOPSY Right 11/2012  . CESAREAN SECTION    . CHOLECYSTECTOMY    . COLONOSCOPY  05/02/2011   Procedure: COLONOSCOPY;  Surgeon: Winfield Cunas., MD;  Location: [facility];  Service:  Endoscopy;  Laterality: N/A;  . colonoscopy  11/04/2014  . ESOPHAGEAL MANOMETRY N/A 11/04/2015   Procedure: ESOPHAGEAL MANOMETRY (EM);  Surgeon: Laurence Spates, MD;  Location: [facility];  Service: Endoscopy;  Laterality: N/A;  . ESOPHAGOGASTRODUODENOSCOPY  05/02/2011   Procedure: ESOPHAGOGASTRODUODENOSCOPY (EGD);  Surgeon: Winfield Cunas., MD;  Location: [facility];  Service: Endoscopy;  Laterality: N/A;  . ESOPHAGOGASTRODUODENOSCOPY N/A 09/01/2014   Procedure: ESOPHAGOGASTRODUODENOSCOPY (EGD);  Surgeon: Laurence Spates, MD;  Location: [facility];  Service: Endoscopy;  Laterality: N/A;  . KNEE SURGERY    . LAPAROSCOPY    . laproscopic abdominal    . [place] IMPEDANCE STUDY N/A 11/04/2015   Procedure: [place] IMPEDANCE STUDY;  Surgeon: Laurence Spates, MD;  Location: [facility];  Service: Endoscopy;  Laterality: N/A;  . VAGINAL HYSTERECTOMY      FAMILY HISTORY: Family History  Problem Relation Age of Onset  . Colon cancer Father   . Hypertension Father   . Stroke Father   .  Heart disease Father   . Heart attack Father   . Colon cancer Mother   . Diabetes Maternal Uncle   . Stroke Paternal Grandmother   . Heart attack Paternal Grandmother   . Heart attack Maternal Grandmother   . Heart attack Maternal Grandfather   . Cancer Maternal Uncle   . Breast cancer Neg Hx     SOCIAL HISTORY: Social History   Socioeconomic History  . Marital status: Married    Spouse name: Not on file  . Number of children: Not on file  . Years of education: Not on file  . Highest education level: Not on file  Occupational History  . Not on file  Social Needs  . Financial resource strain: Not on file  . Food insecurity:    Worry: Not on file    Inability: Not on file  . Transportation needs:    Medical: Not on file    Non-medical: Not on file  Tobacco Use  . Smoking status: Never Smoker  . Smokeless tobacco: Never Used  Substance and Sexual Activity  . Alcohol use: Yes    Comment: occasionally   . Drug use: No  . Sexual activity: Not on file    Comment: married   Lifestyle  . Physical activity:    Days per week: Not on file    Minutes per session: Not on file  . Stress: Not on file  Relationships  . Social connections:    Talks on phone: Not on file    Gets together: Not on file    Attends religious service: Not on file    Active member of club or organization: Not on file    Attends meetings of clubs or organizations: Not on file    Relationship status: Not on file  . Intimate partner violence:    Fear of current or ex partner: Not on file    Emotionally abused: Not on file    Physically abused: Not on file    Forced sexual activity: Not on file  Other Topics Concern  . Not on file  Social History Narrative  . Not on file     PHYSICAL EXAM  Vitals:   06/16/17 1108  BP: 108/69  Pulse: 73  Weight: 205 lb 9.6 oz (93.3 kg)  Height: 5\' 7"  (1.702 m)   Body mass index is 32.2 kg/m.  Generalized: Well developed, obese female in no acute distress  Head: normocephalic and atraumatic,. Oropharynx benign  Neck: Supple,  Musculoskeletal: No deformity   Neurological examination   Mentation: Alert oriented to time, place, history taking. Attention span and concentration appropriate. Recent and remote memory intact.  Follows all commands speech and language fluent.   Cranial nerve II-XII: .Pupils were equal round reactive to light extraocular movements were full, visual field were full on confrontational test. Facial sensation and strength were normal. hearing was intact to finger rubbing bilaterally. Uvula tongue midline. head turning and shoulder shrug were normal and symmetric.Tongue protrusion into cheek strength was normal. Motor: normal bulk and tone, full strength in the BUE, BLE, fine finger movements normal, no pronator drift. No focal weakness Sensory: normal and symmetric to light touch,  Coordination: finger-nose-finger, heel-to-shin bilaterally, no  dysmetria Gait and Station: Rising up from seated position without assistance, normal stance,  moderate stride, good arm swing, smooth turning, able to perform tiptoe, and heel walking without difficulty. Tandem gait is steady  DIAGNOSTIC DATA (LABS, IMAGING, TESTING) - I reviewed patient records, labs, notes,  testing and imaging myself where available.  Lab Results  Component Value Date   WBC 5.7 05/25/2016   HGB 13.1 05/25/2016   HCT 40.0 05/25/2016   MCV 101.0 (H) 05/25/2016   PLT 196 05/25/2016      Component Value Date/Time   NA 138 05/25/2016 1700   K 3.8 05/25/2016 1700   CL 105 05/25/2016 1700   CO2 24 05/25/2016 1700   GLUCOSE 91 05/25/2016 1700   BUN 9 05/25/2016 1700   CREATININE 0.72 05/25/2016 1700   CALCIUM 9.5 05/25/2016 1700   PROT 7.5 05/25/2016 1700   ALBUMIN 3.9 05/25/2016 1700   AST 27 05/25/2016 1700   ALT 19 05/25/2016 1700   ALKPHOS 76 05/25/2016 1700   BILITOT 1.2 05/25/2016 1700   GFRNONAA >60 05/25/2016 1700   GFRAA >60 05/25/2016 1700    ASSESSMENT AND PLAN  47 y.o. year old female  has a past medical history of Adenomyosis, Arthritis (2013), Asthmatic bronchitis (01/31/2017), DVT of lower extremity (deep venous thrombosis) ([place]), Dysmenorrhea, Endometriosis, Fibroids, GERD (gastroesophageal reflux disease), Kidney stones, Menorrhagia, Migraine, Recurrent upper respiratory infection (URI), Sebaceous cyst of breast, right lower inner quadrant (10/25/2012), and Syncope, non cardiac. here to follow-up for mild obstructive sleep apnea with autopap. Data dated 05/15/2017-611/2019 shows compliance greater than 4 hours at 77%.  Average usage 6 hours of 1 minute.  Set pressure 5 to 9 cm.  EPR level 3 leaks 95th percentile 0.6.  AHI 2.8.   Restart CPAP at same settings  Try it for another week and if bloating recurs call in to the office  Kristen Dr. Tori Milks nurse can set you up for referral to dentist for appliance Keep follow up appt with Dr. Wyvonne Lenz, [facility], [facility], [facility] [address] [place], [place] [address] [phone]  I reviewed the above note and documentation by the Nurse Practitioner and agree with the history, physical exam, assessment and plan as outlined above.   Star Age, MD, PhD [facility])

## 2017-06-16 ENCOUNTER — Encounter: Payer: Self-pay | Admitting: Nurse Practitioner

## 2017-06-16 ENCOUNTER — Encounter: Payer: 59 | Admitting: Occupational Therapy

## 2017-06-16 ENCOUNTER — Ambulatory Visit: Payer: 59 | Admitting: Nurse Practitioner

## 2017-06-16 DIAGNOSIS — Z9989 Dependence on other enabling machines and devices: Secondary | ICD-10-CM | POA: Diagnosis not present

## 2017-06-16 DIAGNOSIS — G4733 Obstructive sleep apnea (adult) (pediatric): Secondary | ICD-10-CM

## 2017-06-16 NOTE — Patient Instructions (Signed)
Restart CPAP at same settings  Try it for another week and if bloating recurs call in to the office  Kristen Dr. Tori Milks nurse can set you up for referral to dentist for appliance Keep follow up appt with Dr. Rexene Alberts

## 2017-06-19 ENCOUNTER — Ambulatory Visit: Payer: 59 | Admitting: Physical Therapy

## 2017-06-21 ENCOUNTER — Encounter: Payer: Self-pay | Admitting: Neurology

## 2017-06-22 ENCOUNTER — Telehealth: Payer: Self-pay | Admitting: Nurse Practitioner

## 2017-06-22 DIAGNOSIS — G4733 Obstructive sleep apnea (adult) (pediatric): Secondary | ICD-10-CM

## 2017-06-22 DIAGNOSIS — Z789 Other specified health status: Secondary | ICD-10-CM

## 2017-06-22 NOTE — Telephone Encounter (Signed)
Pt called stating that while using her autopap for about a week now she is still experiencing chest pain and bloating also stating she woke up this morning with sharpe pains under her shoulder blades. Requesting a call to discuss any options to help maintain her pain

## 2017-06-22 NOTE — Telephone Encounter (Signed)
I spoke to pt.  She resumed the a-pap and has noted sx below.  Per CM/NP note try dental appliance.  Pt was under the impression that this was not as effective with her level of sleep apnea.  I did relay that Dr. Rexene Alberts did see CM's note.  I did download compliance report and will forward to Dr. Rexene Alberts.  Any other options?  Aware of upcoming appt with Dr. Rexene Alberts 07-27-17.  Please advise.

## 2017-06-22 NOTE — Telephone Encounter (Signed)
Sandy: pls call patient, I will refer her to Dr. Toy Cookey for eval/treatment for OSA with a dental device.  Dana: Referral to Dr. Augustina Mood, dentistry, for oral appl for OSA placed in chart. Please send my notes and sleep study results as well as tech data with referral, thanks.  Star Age, MD, PhD [facility])

## 2017-06-22 NOTE — Telephone Encounter (Signed)
I called pt and relayed that after reviewing chart notes Dr. Rexene Alberts did refer to Dr. Augustina Mood for eval/OSA with dental device.  She will pause using the apap but will return after her sx calm down until seen by Dr. Toy Cookey.  Will keep appt with Dr. Rexene Alberts as planned.

## 2017-06-22 NOTE — Addendum Note (Signed)
Addended by: Star Age on: 06/22/2017 03:52 PM   Modules accepted: Orders

## 2017-06-23 NOTE — Therapy (Signed)
[facility] [address] [place], [address] Phone: [phone]   Fax:  [phone]  Occupational Therapy Treatment  Patient Details  Name: Karen [name] MRN: [id number] Date of Birth: 07/02/1970 Referring Provider: Vertis Kelch   Encounter Date: 06/13/2017  OT End of Session - 06/23/17 2021    Visit Number  11    Number of Visits  12    Date for OT Re-Evaluation  07/24/17    OT Start Time  1615    OT Stop Time  1700    OT Time Calculation (min)  45 min    Activity Tolerance  Patient tolerated treatment well    Behavior During Therapy  [facility] for tasks assessed/performed       Past Medical History:  Diagnosis Date  . Adenomyosis   . Arthritis 2013   L knee gets steroid injections   . Asthmatic bronchitis 01/31/2017  . DVT of lower extremity (deep venous thrombosis) ([place])   . Dysmenorrhea   . Endometriosis   . Fibroids   . GERD (gastroesophageal reflux disease)   . Kidney stones   . Menorrhagia   . Migraine   . Recurrent upper respiratory infection (URI)   . Sebaceous cyst of breast, right lower inner quadrant 10/25/2012   Excised 11/21/12   . Syncope, non cardiac     Past Surgical History:  Procedure Laterality Date  . ARTHROSCOPIC HAGLUNDS REPAIR    . BREAST CYST EXCISION Right 11/21/2012   Procedure: CYST EXCISION BREAST;  Surgeon: Haywood Lasso, MD;  Location: [place];  Service: General;  Laterality: Right;  . BREAST EXCISIONAL BIOPSY Right 11/2012  . CESAREAN SECTION    . CHOLECYSTECTOMY    . COLONOSCOPY  05/02/2011   Procedure: COLONOSCOPY;  Surgeon: Winfield Cunas., MD;  Location: [facility];  Service: Endoscopy;  Laterality: N/A;  . colonoscopy  11/04/2014  . ESOPHAGEAL MANOMETRY N/A 11/04/2015   Procedure: ESOPHAGEAL MANOMETRY (EM);  Surgeon: Laurence Spates, MD;  [facility]: [facility];  Service: Endoscopy;  Laterality: N/A;  . ESOPHAGOGASTRODUODENOSCOPY   05/02/2011   Procedure: ESOPHAGOGASTRODUODENOSCOPY (EGD);  Surgeon: Winfield Cunas., MD;  Location: [facility];  Service: Endoscopy;  Laterality: N/A;  . ESOPHAGOGASTRODUODENOSCOPY N/A 09/01/2014   Procedure: ESOPHAGOGASTRODUODENOSCOPY (EGD);  Surgeon: Laurence Spates, MD;  Location: [facility];  Service: Endoscopy;  Laterality: N/A;  . KNEE SURGERY    . LAPAROSCOPY    . laproscopic abdominal    . [place] IMPEDANCE STUDY N/A 11/04/2015   Procedure: [place] IMPEDANCE STUDY;  Surgeon: Laurence Spates, MD;  [facility]: [facility];  Service: Endoscopy;  Laterality: N/A;  . VAGINAL HYSTERECTOMY      There were no vitals filed for this visit.  Subjective Assessment - 06/23/17 2021    Subjective   Patient reports the elbow was a bit better today, not as painful with internal rotation.     Patient Stated Goals  I want to have the pain better in my elbow so I can do workouts again     Currently in Pain?  Yes    Pain Score  2     Pain Location  Elbow    Pain Orientation  Left    Pain Descriptors / Indicators  Aching    Pain Type  Acute pain    Pain Onset  1 to 4 weeks ago    Pain Frequency  Intermittent       Contrast to LUE  prior to exercises  No pain or tenderness today upon arrival Extended elbow stretch to forearm extensors - 5 x 5 sec both positions Patient to continue to perform passive stretch, keeping pain 2/10 or less at home,   Extended arm position - composite wrist flexion    Skin check performed this date, still had 2 pin point scabs  on arm and continued to modify ionto patch by snipping edges for patch to go around spots.  She reports no issues with ionto last session.       Soft tissue mobs performed after contrast this date followed by use of Graston tool nr 2 for sweeping over dorsal forearm and proximal forearm with little tightness noted this date -massage manual cross friction over lateral epicondyle  And using graston brushing tool nr 2    Extended arm passive stretch for wrist  extensors 5 reps  Hold 5 sec after heat    Grip strength reassessed this date 52# on left          OT Treatments/Exercises (OP) - 06/23/17 0001      Iontophoresis   Type of Iontophoresis  Dexamethasone    Location  left lateral epicondyle    Dose  medium patch with 2.0 intensity    Time  20             OT Education - 06/23/17 2021    Education provided  Yes    Education Details  HEP and follow up with therapist in a few weeks    Person(s) Educated  Patient    Methods  Explanation;Demonstration    Comprehension  Verbalized understanding       OT Short Term Goals - 06/23/17 2025      OT SHORT TERM GOAL #1   Title  Pain on L elbow on PREE decrease more than 20 points     Baseline  pain at eval on PREE 40/50    Time  4    Period  Weeks    Status  On-going      OT SHORT TERM GOAL #2   Title  Pt to be independent in HEP to wear splint , decrease pain and maintain and increase ROM and strength in L elbow     Baseline  no splint or knowledge of HEP     Time  3    Period  Weeks    Status  Achieved        OT Long Term Goals - 06/23/17 2025      OT LONG TERM GOAL #1   Title  Pt use of L arm improve for function on PREE score by more than  15 points     Baseline  PREE score for function 29/50     Time  6    Period  Weeks    Status  On-going      OT LONG TERM GOAL #2   Title  L elbow and grip strength improve to WNL for pt and in range for her age to use in all ADL's and IADL's without increase symptoms     Baseline  pain 4-9 /10 pain with functional use     Status  On-going            Plan - 06/23/17 2023    Clinical Impression Statement  Patient complained of less pain this date, seen for her 10th iontophoresis treatment and will continue with HEP at home with heat, massage and stretches and follow  up with therapist in 3 weeks to reassess status.  Patient continues to benefit from skilled OT services to decrease pain, increase motion and improve  independence in daily tasks.     Occupational performance deficits (Please refer to evaluation for details):  ADL's;IADL's;Work;Play;Leisure    Rehab Potential  Good    OT Frequency  1x / week    OT Duration  6 weeks    OT Treatment/Interventions  Iontophoresis;Self-care/ADL training;Patient/family education;Splinting;Cryotherapy;Ultrasound;Manual Therapy;Passive range of motion;Therapeutic exercise    Consulted and Agree with Plan of Care  Patient       Patient will benefit from skilled therapeutic intervention in order to improve the following deficits and impairments:  Impaired flexibility, Pain, Decreased strength, Decreased range of motion, Impaired UE functional use, Decreased knowledge of precautions  Visit Diagnosis: Stiffness of left wrist, not elsewhere classified  Muscle weakness (generalized)  Other lack of coordination  Pain in left elbow    Problem List Patient Active Problem List   Diagnosis Date Noted  . Obstructive sleep apnea treated with continuous positive airway pressure (CPAP) 06/16/2017  . Perennial allergic rhinitis with a nonallergic component 01/31/2017  . Asthmatic bronchitis 01/31/2017  . GI symptoms 01/31/2017  . Chest pain 12/13/2012  . Dyspnea 12/13/2012   Tania Perrott T Tomasita Morrow, OTR/L, CLT  Jim Philemon 06/23/2017, 8:39 PM  [facility] [address], [place], [address] Phone: [phone]   Fax:  [phone]  Name: Karen [name] MRN: [id number] Date of Birth: 1971/01/01

## 2017-06-26 NOTE — Telephone Encounter (Signed)
Referral has been sent to Dr. Toy Cookey sleep study  Attached.

## 2017-06-27 NOTE — Telephone Encounter (Signed)
Patient also called and wanted referral to sent to her desntist to [facility] [place] fax [phone]

## 2017-06-28 ENCOUNTER — Ambulatory Visit: Payer: Self-pay | Admitting: Gastroenterology

## 2017-06-28 DIAGNOSIS — G4733 Obstructive sleep apnea (adult) (pediatric): Secondary | ICD-10-CM | POA: Diagnosis not present

## 2017-06-28 DIAGNOSIS — M6281 Muscle weakness (generalized): Secondary | ICD-10-CM

## 2017-07-04 MED FILL — RELISTOR 150 MG TABLET: 150 | 30 days supply | Qty: 60 | Fill #1

## 2017-07-04 MED FILL — LEVOCETIRIZINE 5 MG TABLET: 5 | 30 days supply | Qty: 30 | Fill #1

## 2017-07-12 DIAGNOSIS — G4733 Obstructive sleep apnea (adult) (pediatric): Secondary | ICD-10-CM | POA: Diagnosis not present

## 2017-07-14 ENCOUNTER — Ambulatory Visit: Payer: 59 | Attending: Internal Medicine | Admitting: Occupational Therapy

## 2017-07-14 DIAGNOSIS — M6281 Muscle weakness (generalized): Secondary | ICD-10-CM | POA: Insufficient documentation

## 2017-07-14 DIAGNOSIS — M25522 Pain in left elbow: Secondary | ICD-10-CM | POA: Insufficient documentation

## 2017-07-14 NOTE — Patient Instructions (Signed)
Pt can start back with her prayer stretch - and walking back on hand to standing  Yellow band for retraction-  And GTB for lunges and squads - but not to hold on   2 wks - increase reps and sets   And then can start back with 1 lbs and increase  Reps and sets gradually with Zumba -  Cont with heat , massage , stretches and ice as needed

## 2017-07-14 NOTE — Therapy (Signed)
[facility] [address], [place], [address] Phone: [phone]   Fax:  [phone]  Occupational Therapy Treatment  Patient Details  Name: Karen [name] MRN: [id number] Date of Birth: April 12, 1970 Referring Provider: Vertis Kelch   Encounter Date: 07/14/2017  OT End of Session - 07/14/17 1303    Visit Number  12    Number of Visits  13    Date for OT Re-Evaluation  07/24/17    OT Start Time  1218    OT Stop Time  1256    OT Time Calculation (min)  38 min    Activity Tolerance  Patient tolerated treatment well    Behavior During Therapy  [facility] for tasks assessed/performed       Past Medical History:  Diagnosis Date  . Adenomyosis   . Arthritis 2013   L knee gets steroid injections   . Asthmatic bronchitis 01/31/2017  . DVT of lower extremity (deep venous thrombosis) ([place])   . Dysmenorrhea   . Endometriosis   . Fibroids   . GERD (gastroesophageal reflux disease)   . Kidney stones   . Menorrhagia   . Migraine   . Recurrent upper respiratory infection (URI)   . Sebaceous cyst of breast, right lower inner quadrant 10/25/2012   Excised 11/21/12   . Syncope, non cardiac     Past Surgical History:  Procedure Laterality Date  . ARTHROSCOPIC HAGLUNDS REPAIR    . BREAST CYST EXCISION Right 11/21/2012   Procedure: CYST EXCISION BREAST;  Surgeon: Haywood Lasso, MD;  Location: [place];  Service: General;  Laterality: Right;  . BREAST EXCISIONAL BIOPSY Right 11/2012  . CESAREAN SECTION    . CHOLECYSTECTOMY    . COLONOSCOPY  05/02/2011   Procedure: COLONOSCOPY;  Surgeon: Winfield Cunas., MD;  Location: [facility];  Service: Endoscopy;  Laterality: N/A;  . colonoscopy  11/04/2014  . ESOPHAGEAL MANOMETRY N/A 11/04/2015   Procedure: ESOPHAGEAL MANOMETRY (EM);  Surgeon: Laurence Spates, MD;  Location: [facility];  Service: Endoscopy;  Laterality: N/A;  . ESOPHAGOGASTRODUODENOSCOPY   05/02/2011   Procedure: ESOPHAGOGASTRODUODENOSCOPY (EGD);  Surgeon: Winfield Cunas., MD;  Location: [facility];  Service: Endoscopy;  Laterality: N/A;  . ESOPHAGOGASTRODUODENOSCOPY N/A 09/01/2014   Procedure: ESOPHAGOGASTRODUODENOSCOPY (EGD);  Surgeon: Laurence Spates, MD;  Location: [facility];  Service: Endoscopy;  Laterality: N/A;  . KNEE SURGERY    . LAPAROSCOPY    . laproscopic abdominal    . [place] IMPEDANCE STUDY N/A 11/04/2015   Procedure: [place] IMPEDANCE STUDY;  Surgeon: Laurence Spates, MD;  Location: [facility];  Service: Endoscopy;  Laterality: N/A;  . VAGINAL HYSTERECTOMY      There were no vitals filed for this visit.  Subjective Assessment - 07/14/17 1300    Subjective   Been doing good- did not wear my brace as much and did not do exercises every day -did traval and could feel little my elbow if over done it - but otherwise did not feel it - no pain - did not start back with weight at [place] or my pelvic health HEP     Patient Stated Goals  I want to have the pain better in my elbow so I can do workouts again     Currently in Pain?  No/denies         [facility] OT Assessment - 07/14/17 0001      Strength   Right Hand Grip (lbs)  70  extender arm     Left Hand Grip (lbs)  65 65 extender arm         no pain or tenderness   negative for resistance to middle finger extention and wrist extention   grip assess- great improvement - no pain     Review with pt her act and exercises she done in past prior to injury  Review with her in clinic   Pt can start back with her prayer stretch - and walking back on hand to standing Had no pain   Yellow band for retraction- but not tight grip or built up handle for gripping band   And GTB for lunges and squads - but not to hold on - rest hands on bands   2 wks - increase reps and sets gradually if no pain   And then can start back with 1 lbs and increase  Reps and sets gradually with Zumba - but avoid wrist extention and tight sustained  grip  Cont with heat , massage , stretches and ice as needed                 OT Education - 07/14/17 1303    Education provided  Yes    Education Details  HEP to return to some of her previous act     Person(s) Educated  Patient    Methods  Explanation;Demonstration    Comprehension  Verbalized understanding;Returned demonstration    Person(s) Educated  Patient    Methods  Explanation;Demonstration       OT Short Term Goals - 07/14/17 1306      OT SHORT TERM GOAL #1   Title  Pain on L elbow on PREE decrease more than 20 points     Baseline  pain at eval on PREE 40/50 and now 4/50    Status  Achieved      OT SHORT TERM GOAL #2   Title  Pt to be independent in HEP to wear splint , decrease pain and maintain and increase ROM and strength in L elbow     Status  Achieved        OT Long Term Goals - 07/14/17 1306      OT LONG TERM GOAL #1   Title  Pt use of L arm improve for function on PREE score by more than  15 points     Baseline  PREE score for function 29/50  - increase greatly - reassess next time     Time  4    Period  Weeks    Status  On-going    Target Date  08/11/17      OT LONG TERM GOAL #2   Title  L elbow and grip strength improve to WNL for pt and in range for her age to use in all ADL's and IADL's without increase symptoms     Status  Achieved            Plan - 07/14/17 1304    Clinical Impression Statement  Pt was negative for test for lat epicondyle - ROM same as R and grip strength increase 20 lbs - elbow to side and extended arm - and no pain - review with pt some of act or exercises she done prior to onset of injury - and pt to start back gradually over the next 2-4 wks with that and return for last follow up if needed in month     Occupational performance deficits (Please  refer to evaluation for details):  ADL's;IADL's;Work;Play;Leisure    Rehab Potential  Good    OT Frequency  Monthly    OT Duration  4 weeks    OT  Treatment/Interventions  Iontophoresis;Self-care/ADL training;Patient/family education;Splinting;Cryotherapy;Ultrasound;Manual Therapy;Passive range of motion;Therapeutic exercise    Plan  follow up in month after returning to some of her act prior to injury     Clinical Decision Making  Limited treatment options, no task modification necessary    OT Home Exercise Plan  see pt instruction    Consulted and Agree with Plan of Care  Patient       Patient will benefit from skilled therapeutic intervention in order to improve the following deficits and impairments:  Impaired flexibility, Pain, Decreased strength, Decreased range of motion, Impaired UE functional use, Decreased knowledge of precautions  Visit Diagnosis: Muscle weakness (generalized)  Pain in left elbow    Problem List Patient Active Problem List   Diagnosis Date Noted  . Obstructive sleep apnea treated with continuous positive airway pressure (CPAP) 06/16/2017  . Perennial allergic rhinitis with a nonallergic component 01/31/2017  . Asthmatic bronchitis 01/31/2017  . GI symptoms 01/31/2017  . Chest pain 12/13/2012  . Dyspnea 12/13/2012    Rosalyn Gess OTR/L,CLT 07/14/2017, 1:07 PM  [facility] [address], [place], [address] Phone: [phone]   Fax:  [phone]  Name: Karen [name] MRN: [id number] Date of Birth: Oct 30, 1970

## 2017-07-18 MED FILL — XIIDRA 5% EYE DROPS: 5 | 30 days supply | Qty: 60 | Fill #0

## 2017-07-21 NOTE — Telephone Encounter (Signed)
Pt today realized she will not be able to keep appt on 7/25, she will fly out on Monday AM for a conference in [place]. Please call to try to get her in within the 90day period. Pt said she started autopap on 5/12. She can be reached at [phone]

## 2017-07-21 NOTE — Telephone Encounter (Addendum)
I called pt. I advised her that pt followed up with Hoyle Sauer, NP on 06/16/17 after starting auto pap on 05/14/17, she falls within the 31-90 days. It appears that per Carolyn's note, pt was 77% compliant between 05/15/17-06/13/2017. I will verify this with [facility].  Pt has not started the oral appliance. Her insurance is not in network with Dr. Toy Cookey nor her regular dentist regarding the oral appliance.  Pt is still having problems with reflux while using autopap. She asked to be put on the waitlist for a sooner appt, but scheduled a follow up for 10/03/17.

## 2017-07-24 NOTE — Telephone Encounter (Signed)
Received this notice from [place]: "Karen [name],  So I have been informed that [name] doesn't require any follow up compliance notes. "

## 2017-07-27 ENCOUNTER — Ambulatory Visit: Payer: Self-pay | Admitting: Neurology

## 2017-07-27 NOTE — Telephone Encounter (Signed)
I called pt to offer her an appt with Dr. Rexene Alberts 08/10/17 at 1:00pm. No answer, no VM set up, will try again later.

## 2017-07-27 NOTE — Telephone Encounter (Signed)
I called pt. I offered her an appt on 08/10/17 at 1:00pm with Dr. Rexene Alberts, check in at 12:30pm. Pt is agreeable to this appt date and time and verbalized understanding.

## 2017-08-04 MED FILL — ALIGN 4 MG CAPSULE: 28 days supply | Qty: 28 | Fill #2

## 2017-08-07 MED FILL — RELISTOR 150 MG TABLET: 150 | 30 days supply | Qty: 60 | Fill #2

## 2017-08-08 ENCOUNTER — Encounter: Payer: Self-pay | Admitting: Neurology

## 2017-08-10 ENCOUNTER — Ambulatory Visit: Payer: 59 | Admitting: Neurology

## 2017-08-10 ENCOUNTER — Encounter: Payer: Self-pay | Admitting: Neurology

## 2017-08-10 VITALS — BP 119/82 | HR 66 | Ht 67.0 in | Wt 212.5 lb

## 2017-08-10 DIAGNOSIS — G4733 Obstructive sleep apnea (adult) (pediatric): Secondary | ICD-10-CM | POA: Diagnosis not present

## 2017-08-10 DIAGNOSIS — R635 Abnormal weight gain: Secondary | ICD-10-CM | POA: Diagnosis not present

## 2017-08-10 DIAGNOSIS — Z789 Other specified health status: Secondary | ICD-10-CM | POA: Diagnosis not present

## 2017-08-10 NOTE — Patient Instructions (Signed)
You have not been able to use the autoPAP. I appreciate you trying AutoPap as your initial treatment option for your overall mild sleep apnea. Unfortunately, you have had ongoing issues tolerating the machine and the pressure. I can refer you to a dentist for consideration for an oral appliance, you can call our office, if you find one in-network.  I will send a D/C order to [facility] for discontinuation of your AutoPap therapy when you are ready. I do believe, weight loss may be a real alternative for you. As discussed, I have referred you to the healthy weight and wellness clinic, Dr. Leafy Ro (and her associates). I do not think you need to consider seeing an ENT for surgical eval for OSA.  I will see you back as needed at this juncture.

## 2017-08-10 NOTE — Progress Notes (Signed)
Subjective:    Patient ID: Karen [name] is a 47 y.o. female.  HPI     Interim history:   Karen [name] is a 47 year old right-handed woman with an underlying medical history of mild intermittent asthma, irritable bowel syndrome, food intolerances, allergic rhinitis, interstitial cystitis, history of DVT in 1992, reflux disease, migraine headaches, history of vertigo, kidney stones, and borderline obesity, who presents for follow-up consultation of her obstructive sleep apnea on AutoPap therapy. The patient is unaccompanied today. I first met her on 03/23/2017 at the request of her primary care provider, at which time she reported snoring and witnessed apneas per husband's report as well as daytime somnolence. She was advised to proceed with sleep study testing. She had a baseline sleep study on 04/25/2017. I went over her test results with her in detail today. Sleep latency was 2 minutes, REM latency 93 minutes, sleep efficiency 91.7%. She had an increased percentage of stage II sleep, slow-wave sleep was absent and REM sleep was reduced at 13.4%. Total AHI was in the mild range at 6 per hour, REM AHI severe at 30.5 per hour, supine AHI 8.2 per hour. Average oxygen saturation was 95%, nadir was 80% during supine REM sleep. Based on her test results and sleep related complaints I suggested a trial of AutoPap therapy.  She saw Cecille Rubin, nurse practitioner in the interim on 06/16/2017 at which time she was compliant with AutoPap therapy but was complaining of bloating. She was encouraged to continue to try using AutoPap if possible and monitor her Sx until her next appt.  Today, 08/10/17: I reviewed her AutoPap compliance data from 07/10/2017 through 08/08/2017 which is a total of 30 days, during which time she used her AutoPap 26 days with percent used days greater than 4 hours at 77%, indicating adequate compliance with an average usage of 4 hours and 47 minutes, residual AHI at goal at 2.1 per  hour, 95th percentile pressure at 8.7 cm with a range of 5 cm to 9 cm with EPR. Leak on the low side with the 95th percentile at 5.1 L/m. She reports still struggling with the AutoPap pressure and the mask. She does not feel comfortable. She has had no improvement in her reflux or GI symptoms. Furthermore, she feels like she has been struggling with respiratory symptoms. She has been treated for asthma, even needed steroids which in turn caused her to gain more weight. She really did try to be compliant with treatment but is not raeping any benefit from it. For consideration of an oral appliance she has seen Dr. Toy Cookey and also her own dentist but unfortunately, both dentists our out of network.   Previously:   03/23/2017: (She) reports snoring and excessive daytime somnolence as well as apneic pauses while asleep per husband's report. I reviewed your office note from 03/10/2017, which you kindly included. Her Epworth sleepiness score is 11 out of 24 today, fatigue score is 38/63. She has woken up with a sense of choking. She has had recurrent respiratory infections. She works as a Armed forces operational officer. She lives at home with her husband and 2 children. She is a nonsmoker and drinks alcohol rarely, maybe once a year or so. Caffeine several times a week but not necessarily daily. Had recent GI issues including irritable bowel syndrome, food intolerance, for the past year or 2. She has lost about 50 pounds. She also started having recurrent respiratory infections, allergy flareups. She has seen an allergy specialist for this.  She was started on new medications. She has been doing physical therapy for pelvic floor exercises. Bedtime is around 11 PM and she does not have significant difficulty falling asleep. She feels exhausted. Rise time is around 5 AM. She has nocturia every night. She has woken up with a headache. She has pauses in her breathing. She denies telltale symptoms of restless leg syndrome.  Her Past  Medical History Is Significant For: Past Medical History:  Diagnosis Date  . Adenomyosis   . Arthritis 2013   L knee gets steroid injections   . Asthmatic bronchitis 01/31/2017  . DVT of lower extremity (deep venous thrombosis) ([place])   . Dysmenorrhea   . Endometriosis   . Fibroids   . GERD (gastroesophageal reflux disease)   . Kidney stones   . Menorrhagia   . Migraine   . Recurrent upper respiratory infection (URI)   . Sebaceous cyst of breast, right lower inner quadrant 10/25/2012   Excised 11/21/12   . Syncope, non cardiac     Her Past Surgical History Is Significant For: Past Surgical History:  Procedure Laterality Date  . ARTHROSCOPIC HAGLUNDS REPAIR    . BREAST CYST EXCISION Right 11/21/2012   Procedure: CYST EXCISION BREAST;  Surgeon: Haywood Lasso, MD;  Location: [place];  Service: General;  Laterality: Right;  . BREAST EXCISIONAL BIOPSY Right 11/2012  . CESAREAN SECTION    . CHOLECYSTECTOMY    . COLONOSCOPY  05/02/2011   Procedure: COLONOSCOPY;  Surgeon: Winfield Cunas., MD;  Location: [facility];  Service: Endoscopy;  Laterality: N/A;  . colonoscopy  11/04/2014  . ESOPHAGEAL MANOMETRY N/A 11/04/2015   Procedure: ESOPHAGEAL MANOMETRY (EM);  Surgeon: Laurence Spates, MD;  Location: [facility];  Service: Endoscopy;  Laterality: N/A;  . ESOPHAGOGASTRODUODENOSCOPY  05/02/2011   Procedure: ESOPHAGOGASTRODUODENOSCOPY (EGD);  Surgeon: Winfield Cunas., MD;  Location: [facility];  Service: Endoscopy;  Laterality: N/A;  . ESOPHAGOGASTRODUODENOSCOPY N/A 09/01/2014   Procedure: ESOPHAGOGASTRODUODENOSCOPY (EGD);  Surgeon: Laurence Spates, MD;  Location: [facility];  Service: Endoscopy;  Laterality: N/A;  . KNEE SURGERY    . LAPAROSCOPY    . laproscopic abdominal    . [place] IMPEDANCE STUDY N/A 11/04/2015   Procedure: [place] IMPEDANCE STUDY;  Surgeon: Laurence Spates, MD;  Location: [facility];  Service: Endoscopy;  Laterality: N/A;  . VAGINAL HYSTERECTOMY       Her Family History Is Significant For: Family History  Problem Relation Age of Onset  . Colon cancer Father   . Hypertension Father   . Stroke Father   . Heart disease Father   . Heart attack Father   . Colon cancer Mother   . Diabetes Maternal Uncle   . Stroke Paternal Grandmother   . Heart attack Paternal Grandmother   . Heart attack Maternal Grandmother   . Heart attack Maternal Grandfather   . Cancer Maternal Uncle   . Breast cancer Neg Hx     Her Social History Is Significant For: Social History   Socioeconomic History  . Marital status: Married    Spouse name: Not on file  . Number of children: Not on file  . Years of education: Not on file  . Highest education level: Not on file  Occupational History  . Not on file  Social Needs  . Financial resource strain: Not on file  . Food insecurity:    Worry: Not on file    Inability: Not on file  . Transportation needs:  Medical: Not on file    Non-medical: Not on file  Tobacco Use  . Smoking status: Never Smoker  . Smokeless tobacco: Never Used  Substance and Sexual Activity  . Alcohol use: Yes    Comment: occasionally  . Drug use: No  . Sexual activity: Not on file    Comment: married   Lifestyle  . Physical activity:    Days per week: Not on file    Minutes per session: Not on file  . Stress: Not on file  Relationships  . Social connections:    Talks on phone: Not on file    Gets together: Not on file    Attends religious service: Not on file    Active member of club or organization: Not on file    Attends meetings of clubs or organizations: Not on file    Relationship status: Not on file  Other Topics Concern  . Not on file  Social History Narrative  . Not on file    Her Allergies Are:  Allergies  Allergen Reactions  . Molds & Smuts Cough    sneezing  :   Her Current Medications Are:  Outpatient Encounter Medications as of 08/10/2017  Medication Sig  . albuterol (PROAIR HFA) 108 (90  Base) MCG/ACT inhaler Inhale 2 puffs into the lungs every 4 (four) hours as needed for wheezing or shortness of breath.  Marland Kitchen azelastine (ASTELIN) 0.1 % nasal spray Place 2 sprays into both nostrils 2 (two) times daily.  . calcium-vitamin D 250-100 MG-UNIT per tablet Take 1 tablet by mouth daily.   . clindamycin (CLINDAGEL) 1 % gel Apply topically 2 (two) times daily.  . Dapsone (ACZONE) 7.5 % GEL Apply topically.  Marland Kitchen dexlansoprazole (DEXILANT) 60 MG capsule Take 60 mg by mouth daily.  Marland Kitchen ibuprofen (ADVIL,MOTRIN) 200 MG tablet Take 200 mg by mouth every 6 (six) hours as needed for moderate pain.   Marland Kitchen levocetirizine (XYZAL) 5 MG tablet Take 1 tablet (5 mg total) by mouth every evening.  Marland Kitchen Lifitegrast (XIIDRA) 5 % SOLN Apply to eye.  . Methylnaltrexone Bromide (RELISTOR) 150 MG TABS Take 2 tablets by mouth daily.   . montelukast (SINGULAIR) 10 MG tablet Take 1 tablet (10 mg total) by mouth at bedtime.  . Multiple Vitamins-Minerals (MULTIVITAMIN & MINERAL PO) Take 1 tablet by mouth daily.   . ondansetron (ZOFRAN-ODT) 8 MG disintegrating tablet Take 8 mg by mouth every 8 (eight) hours as needed for nausea or vomiting.  . Probiotic Product (PROBIOTIC PO) Take 1 capsule by mouth 4 (four) times daily as needed (for GI upset).  . vitamin B-12 (CYANOCOBALAMIN) 1000 MCG tablet Take 1,000 mcg by mouth daily.  . [DISCONTINUED] Carbinoxamine Maleate (RYVENT) 6 MG TABS Take 6 mg by mouth every 6 (six) hours as needed (every 6-8 hours as needed). (Patient not taking: Reported on 05/23/2017)   No facility-administered encounter medications on file as of 08/10/2017.   :  Review of Systems:  Out of a complete 14 point review of systems, all are reviewed and negative with the exception of these symptoms as listed below:  Review of Systems  Neurological:       Patient reports that she is still having problems with the machine. She has been to see the dentist about an oral appliance as well.     Objective:   Neurological Exam  Physical Exam Physical Examination:   Vitals:   08/10/17 1259  BP: 119/82  Pulse: 66    General  Examination: The patient is a very pleasant 47 y.o. female in no acute distress. She appears well-developed and well-nourished and well groomed.   HEENT: Normocephalic, atraumatic, pupils are equal, round and reactive to light and accommodation. Corrective eye glasses in place. Extraocular tracking is good without limitation to gaze excursion or nystagmus noted. Normal smooth pursuit is noted. Hearing is grossly intact. Face is symmetric with normal facial animation and normal facial sensation. Speech is clear, slightly raspy, with no dysarthria noted. There is no hypophonia. There is no lip, neck/head, jaw or voice tremor. Neck shows FROM. Oropharynx exam reveals: no obvious change, mild overbite.  Chest: Clear to auscultation without wheezing, rhonchi or crackles noted.  Heart: S1+S2+0, regular and normal without murmurs, rubs or gallops noted.   Abdomen: Soft, non-tender and non-distended with normal bowel sounds appreciated on auscultation.  Extremities: There is no pitting edema in the distal lower extremities bilaterally. Left ankle and little wider than right.  Skin: Warm and dry without trophic changes noted.  Musculoskeletal: exam reveals no obvious joint deformities, tenderness or joint swelling or erythema.   Neurologically:  Mental status: The patient is awake, alert and oriented in all 4 spheres. Her immediate and remote memory, attention, language skills and fund of knowledge are appropriate. There is no evidence of aphasia, agnosia, apraxia or anomia. Speech is clear with normal prosody and enunciation. Thought process is linear. Mood is normal and affect is normal.  Cranial nerves II - XII are as described above under HEENT exam.  Motor exam: Normal bulk, strength and tone is noted. There is no tremor. Fine motor skills and coordination: grossly  intact with normal finger taps, normal hand movements, normal rapid alternating patting, normal foot taps and normal foot agility.  Cerebellar testing: No dysmetria or intention tremor. There is no truncal or gait ataxia.  Sensory exam: intact to light touch in the upper and lower extremities.  Gait, station and balance: She stands easily. No veering to one side is noted. No leaning to one side is noted. Posture is age-appropriate and stance is narrow based. Gait shows normal stride length and normal pace. No problems turning are noted.    Assessment and Plan:  In summary, Karen [name] is a very pleasant 47 year old female with an underlying medical history of mild intermittent asthma, irritable bowel syndrome, food intolerances, allergic rhinitis, interstitial cystitis, history of DVT in 1992, reflux disease, migraine headaches, history of vertigo, kidney stones, and obesity, who presents for follow-up consultation of her obstructive sleep apnea which was determined to be in the mild range overall during her baseline sleep study on 04/25/2017. She had significant REM related sleep apnea with an AHI during REM sleep of 30.5 per hour, O2 nadir of 80% during supine REM sleep. She has been compliant with AutoPap therapy but is still struggling with side effects including bloating and she feels that her respiratory symptoms have exacerbated to the point where she needed steroids and has unfortunately secondary to steroid treatment gained weight. She is at this point encouraged to forego treatment with positive airway pressure. She is commended for trying AutoPap therapy as her initial treatment option. She is encouraged to consider alternative options which can be in the form of an oral appliance if she finds a dentist that is in network with her insurance carrier. I would be happy to make a referral. Alternatively, I think losing weight may be a real treatment option for her overall mild obstructive sleep  apnea. She  would be willing to seek help for that and I made a referral to medical weight management. At this juncture, she is advised to follow-up with me on an as-needed basis. She has clearly struggled with AutoPap therapy and has fulfilled compliance criteria but when she is ready to stop AutoPap therapy she is encouraged to call our office and I will place an order for D/C AutoPap which we will send to her DME company at the time. I answered all her questions today and she was in agreement with the plan. I spent 40 minutes in total face-to-face time with the patient, more than 50% of which was spent in counseling and coordination of care, reviewing test results, reviewing medication and discussing or reviewing the diagnosis of OSA, its prognosis and treatment options. Pertinent laboratory and imaging test results that were available during this visit with the patient were reviewed by me and considered in my medical decision making (see chart for details).

## 2017-08-11 ENCOUNTER — Telehealth: Payer: Self-pay | Admitting: Neurology

## 2017-08-11 DIAGNOSIS — Z789 Other specified health status: Secondary | ICD-10-CM

## 2017-08-11 NOTE — Telephone Encounter (Signed)
Pt called stating she is wanting to discontinue her auto-pap, requesting a call once done

## 2017-08-11 NOTE — Telephone Encounter (Signed)
Pt called back she is wanting to know if the order can be faxed to her and [facility], she can take it when she returns the machine in today. Please call to advise. Her fax # is [phone]

## 2017-08-11 NOTE — Telephone Encounter (Signed)
Dr. Rexene Alberts- I reviewed your notes from yesterday. Can you please place an order to D/C AutoPap? I can then send it to her DME company, thank you!

## 2017-08-11 NOTE — Telephone Encounter (Signed)
As discussed with patient yesterday, okay to D/C PAP d/t intolerance. Please send order to [facility].

## 2017-08-11 NOTE — Telephone Encounter (Signed)
Faxed d/c order to [facility] at [phone]. Received fax confirmation. Pt also stopped in office today to pick up copy of d/c order.

## 2017-08-11 NOTE — Addendum Note (Signed)
Addended by: Star Age on: 08/11/2017 10:40 AM   Modules accepted: Orders

## 2017-08-12 DIAGNOSIS — G4733 Obstructive sleep apnea (adult) (pediatric): Secondary | ICD-10-CM | POA: Diagnosis not present

## 2017-09-13 MED FILL — RELISTOR 150 MG TABLET: 150 | 30 days supply | Qty: 60 | Fill #0

## 2017-09-20 MED FILL — XIIDRA 5% EYE DROPS: 5 | 30 days supply | Qty: 60 | Fill #1

## 2017-09-21 DIAGNOSIS — R1033 Periumbilical pain: Secondary | ICD-10-CM | POA: Diagnosis not present

## 2017-09-21 DIAGNOSIS — R195 Other fecal abnormalities: Secondary | ICD-10-CM | POA: Diagnosis not present

## 2017-09-21 DIAGNOSIS — R109 Unspecified abdominal pain: Secondary | ICD-10-CM | POA: Diagnosis not present

## 2017-09-21 DIAGNOSIS — K219 Gastro-esophageal reflux disease without esophagitis: Secondary | ICD-10-CM | POA: Diagnosis not present

## 2017-09-21 DIAGNOSIS — K929 Disease of digestive system, unspecified: Secondary | ICD-10-CM | POA: Diagnosis not present

## 2017-09-21 DIAGNOSIS — K5902 Outlet dysfunction constipation: Secondary | ICD-10-CM | POA: Diagnosis not present

## 2017-10-03 ENCOUNTER — Encounter

## 2017-10-03 ENCOUNTER — Ambulatory Visit: Payer: Self-pay | Admitting: Neurology

## 2017-10-06 MED FILL — COLCHICINE 0.6 MG TABS: 0.6 | 30 days supply | Qty: 60 | Fill #0

## 2017-10-09 MED FILL — RELISTOR 150 MG TABLET: 150 | 30 days supply | Qty: 60 | Fill #1

## 2017-10-11 DIAGNOSIS — K5900 Constipation, unspecified: Secondary | ICD-10-CM | POA: Diagnosis not present

## 2017-10-11 DIAGNOSIS — K219 Gastro-esophageal reflux disease without esophagitis: Secondary | ICD-10-CM | POA: Diagnosis not present

## 2017-10-11 DIAGNOSIS — K582 Mixed irritable bowel syndrome: Secondary | ICD-10-CM | POA: Diagnosis not present

## 2017-10-11 DIAGNOSIS — K5901 Slow transit constipation: Secondary | ICD-10-CM | POA: Diagnosis not present

## 2017-10-19 DIAGNOSIS — Z5181 Encounter for therapeutic drug level monitoring: Secondary | ICD-10-CM | POA: Diagnosis not present

## 2017-10-19 DIAGNOSIS — K5901 Slow transit constipation: Secondary | ICD-10-CM | POA: Diagnosis not present

## 2017-10-26 MED FILL — VENTOLIN HFA 90 MCG INHALER: 108 (90 BAS | 17 days supply | Qty: 18 | Fill #1

## 2017-11-08 MED FILL — RELISTOR 150 MG TABLET: 150 | 30 days supply | Qty: 60 | Fill #2

## 2017-11-08 MED FILL — COLCHICINE 0.6 MG TABS: 0.6 | 30 days supply | Qty: 60 | Fill #1

## 2017-11-17 ENCOUNTER — Ambulatory Visit: Payer: 59 | Attending: Gastroenterology | Admitting: Physical Therapy

## 2017-11-17 ENCOUNTER — Other Ambulatory Visit: Payer: Self-pay

## 2017-11-17 DIAGNOSIS — R278 Other lack of coordination: Secondary | ICD-10-CM | POA: Insufficient documentation

## 2017-11-17 DIAGNOSIS — M6281 Muscle weakness (generalized): Secondary | ICD-10-CM | POA: Insufficient documentation

## 2017-11-17 DIAGNOSIS — R29898 Other symptoms and signs involving the musculoskeletal system: Secondary | ICD-10-CM | POA: Diagnosis not present

## 2017-11-17 NOTE — Therapy (Addendum)
[place] MAIN [facility] SERVICES [address] [place], [place], [address] Phone: [phone]   Fax:  [phone]  Physical Therapy Evaluation  Patient Details  Name: Karen [name] MRN: [id number] Date of Birth: 1970/05/21 Referring Provider (PT): Jacquiline Doe    Encounter Date: 11/17/2017  PT End of Session - 11/17/17 1759    Visit Number  1    Number of Visits  12    Date for PT Re-Evaluation  02/09/18    PT Start Time  1000    PT Stop Time  1110    PT Time Calculation (min)  70 min    Activity Tolerance  Patient tolerated treatment well    Behavior During Therapy  [facility] for tasks assessed/performed       Past Medical History:  Diagnosis Date  . Adenomyosis   . Arthritis 2013   L knee gets steroid injections   . Asthmatic bronchitis 01/31/2017  . DVT of lower extremity (deep venous thrombosis) ([place])   . Dysmenorrhea   . Endometriosis   . Fibroids   . GERD (gastroesophageal reflux disease)   . Kidney stones   . Menorrhagia   . Migraine   . Recurrent upper respiratory infection (URI)   . Sebaceous cyst of breast, right lower inner quadrant 10/25/2012   Excised 11/21/12   . Syncope, non cardiac     Past Surgical History:  Procedure Laterality Date  . ARTHROSCOPIC HAGLUNDS REPAIR    . BREAST CYST EXCISION Right 11/21/2012   Procedure: CYST EXCISION BREAST;  Surgeon: Haywood Lasso, MD;  Location: [place];  Service: General;  Laterality: Right;  . BREAST EXCISIONAL BIOPSY Right 11/2012  . CESAREAN SECTION    . CHOLECYSTECTOMY    . COLONOSCOPY  05/02/2011   Procedure: COLONOSCOPY;  Surgeon: Winfield Cunas., MD;  Location: [facility];  Service: Endoscopy;  Laterality: N/A;  . colonoscopy  11/04/2014  . ESOPHAGEAL MANOMETRY N/A 11/04/2015   Procedure: ESOPHAGEAL MANOMETRY (EM);  Surgeon: Laurence Spates, MD;  Location: [facility];  Service: Endoscopy;  Laterality: N/A;  . ESOPHAGOGASTRODUODENOSCOPY   05/02/2011   Procedure: ESOPHAGOGASTRODUODENOSCOPY (EGD);  Surgeon: Winfield Cunas., MD;  Location: [facility];  Service: Endoscopy;  Laterality: N/A;  . ESOPHAGOGASTRODUODENOSCOPY N/A 09/01/2014   Procedure: ESOPHAGOGASTRODUODENOSCOPY (EGD);  Surgeon: Laurence Spates, MD;  Location: [facility];  Service: Endoscopy;  Laterality: N/A;  . KNEE SURGERY    . LAPAROSCOPY    . laproscopic abdominal    . [place] IMPEDANCE STUDY N/A 11/04/2015   Procedure: [place] IMPEDANCE STUDY;  Surgeon: Laurence Spates, MD;  [facility]: [facility];  Service: Endoscopy;  Laterality: N/A;  . VAGINAL HYSTERECTOMY      There were no vitals filed for this visit.   Subjective Assessment - 11/17/17 1006    Subjective  Pt completed Pelvic PT in April 2019 for 4 months. Her diarrhea Sx had improved by 30% from 3 diarrhea episodes a week to 1x/ week and it was manageable. Pt was able to attend outings and meals with friend with manageable issues.  In May and July 2019, pt had respiratory infections which led to diarrhea and constipation. This is same pattern in the past with her GI Sx as they had been triggered by her respiratory infections. In April, Pt was Dx positive for mild OSA and tried CPAP Tx May to August but this type of CPAP machines worsened her GI Sx and had another respiratory infection which using  the machine. Her sleep doctor [facility] neurology Dr.Athar) took her off the auto-pap machine due to intolerance and she is now asked to undergo weightloss to treat mild OSA.  Pt has lost weight by 25 lb since April.  But pt still sleeping 4-5 hours due to schedule and activities "lots of stuff going on".  Pt has no f/u with Sleep Doctor.  Currently in the past weeks, she noticed she has been choking on liquids or soups. Pt sometimes feels there is something there in her throat. Pt feels she has clear her throat or that another respiratory infection is coming on. Pt has not had any CT scan in 2018 but it was not done in the upper  body/ throat area. Pt has been under treatments with an allergist/ asthma specialist ( Dr. Starling Manns at [facility] ) and Dr. Rexene Alberts ( neurologist for OSA) and Dr. Denice Paradise and Dr. Oletta Lamas ( Both of GI).  Pt was put respiratory medication in Jan but it worsened the GI Sx.  Pt was not put on another medication and was advised to use inhaler with respiratory Sx ( SOB, coughing with yellow spectum, running / nasal congestion with yellow nasal drip/ sore throat) .  Inhaler helped with SOB.  Pt has taken 3 bouts of antibiotics for the sputum when she can not control with Musinex. Within the last week, pt notices she is getting yellow spuctum again and her GI Sx has been bad.  Pt has been diagnosed she has a slow gut. In Sept 2019, pt did an xray which showed signficant "stool burden". Pt took an aggressive cleanse which helped to clear. Since this cleanse, stool type has changed Type 4- 7 to Type 1.  Pt has periodic epsiodes of all liquid stools. Pt was thinking she was doing well but she did not realize her GI was close to obstruction.         Pertinent History  Upon discharge from Pelvic PT in April, pt had returned to [place], maintained pelvic exercises / relaxation fro one month after discharges.  Once she got a respiratory infection in May, she stopped doing all the PT exercises.  Pt was also trying to get use tot he CPAP Tx and took a trip during this time.  Pt had started OT to treat L elbow pain (April-May)  and took took steriods injections. Since Oct, pt started gettign back on track with PT exericses included: body scan, deep core level 1-2, happy baby pose, V slides, pelvic squeezes, and has built back up to Zumba 3 x/ week for last week. Pt is feeling better but she feels she is struggling with keeping up with her plan to exercise, eat better, cook meals, sleep better but she gets interrupted by her diarrhea / constipation because impact her going to Zumba classes. Pt is looking to work with a nutritionist.       Patient  Stated Goals  get to a normal bowel pattern, get back to lift, go out to eat with friends, travel without issues          [facility] PT Assessment - 11/17/17 1742      Assessment   Medical Diagnosis  Constipation     Referring Provider (PT)  Rich, Melissa       Precautions   Precautions  None      Restrictions   Weight Bearing Restrictions  No      Balance Screen   Has the patient fallen in the past  6 months  No      Observation/Other Assessments   Observations  ankles crossed       Coordination   Gross Motor Movements are Fluid and Coordinated  --   chest breathing in seated position    Fine Motor Movements are Fluid and Coordinated  --   shallow breath               Objective measurements completed on examination: See above findings.      [facility] PT Treatment/Exercise - 11/17/17 1741      Therapeutic Activites    Therapeutic Activities  --   see pt instructions     Neuro Re-ed    Neuro Re-ed Details   diaphragmatic excursion cues in seated         PT Long Term Goals - 11/26/17 1705      PT LONG TERM GOAL #1   Title  Pt will decrease her ZUNG anxiety score from 50% to <30 % to demo IND with relaxation practices      Time  12    Period  Weeks    Status  New      PT LONG TERM GOAL #2   Title  Pt will decrease her COREFO score from 30% to < 15% in order to demo improve GI function and better bowel movements    Time  12    Period  Weeks    Status  New      PT LONG TERM GOAL #3   Title  Pt will demo improve less chest breathing and more diaphragmatic excursion to promote parasympathetic nervous system and immune system     Time  4    Period  Weeks    Status  New      PT LONG TERM GOAL #4   Title  Pt will report making more time for self-care practices across 2 weeks and report less feeling of feeling overwhelmed in order to progress towards better digestion and sleep     Time  8    Period  Weeks    Status  New      PT LONG TERM GOAL #5    Title  Pt will report no onset respiratory Sx and subsequent GI Sx with compliance to daily relaxation practices and self-care for 2 weeks.     Time  8    Period  Weeks    Status  New      PT LONG TERM GOAL #6   Title  Pt will report increased sleep hours from 4-5 hours to > 8hr for 3 nights per week in order to demo improved sleep quality     Time  8    Period  Weeks    Status  New    Target Date  01/21/18      PT LONG TERM GOAL #7   Title  Pt will report improved stool consistency from Type 1 to Type 3-5 across 75% of the time to demo imprve consistency     Time  12    Period  Weeks    Status  New    Target Date  02/18/18        Plan - 11/17/17 1800    Clinical Impression Statement     Pt is 47 yo female who reports relapse of digestive and respiratory issues since d/c from Pelvic PT 8 months ago. At the time of d/c in April,  her diarrhea Sx had improved  by 30% from 3 diarrhea episodes a week to 1x/ week and it was manageable. Pt was able to attend outings and meals with friend with manageable issues. Upon discharge from Pelvic PT in April, pt had returned to [place], maintained pelvic exercises / relaxation fro one month after discharges.    In May and July 2019, pt had respiratory infections which led to diarrhea and constipation. This is same pattern in the past with her GI Sx as they had been triggered by her respiratory infections.   Once she got a respiratory infection in May, she stopped doing all the PT exercises.  Currently in the past weeks, she noticed she has been choking on liquids or soups. Pt sometimes feels there is something there in her throat. Pt feels she has clear her throat or that another respiratory infection is coming on. Pt has not had any CT scan in 2018 but it was not done in the upper body/ throat area.  Majority of evaluation today comprised of education and active listening.  Plan of action that will benefit pt will include: motivational interviewing and  strategies under a biopsychosocial model to help down regulate her nervous system, referral for further CT scans for the upper body/ throat area to address her report of "choking on liquids or soups"    ( DPT plans to communicate with MD).  Today, pt was provided strategies for simplifying her HEP with focus on more time for relaxation as this practice had helped her in the beginning of her rehab during the first time she underwent pelvic PT.  However, she has since discontinued this practice. Provided explanation of parasympathetic nervous system on digestion, sleep, and feelings of overwhelm. Provided changing word choice and strategies to minimize feeling overwhelmed. Pt was no longer tearful after Tx. Pt voiced she understood the benefit for the strategies provided.     Rehab Potential Good  PT Frequency 1x / week  PT Duration 12 weeks  PT Treatment/Interventions Therapeutic activities;Therapeutic exercise;Functional mobility training;Neuromuscular re-education;Manual techniques;Patient/family education;Moist Heat;Balance training;Stair training;Energy conservation;Taping;Aquatic Therapy;Biofeedback  Consulted and Agree with Plan of Care Patient     Patient will benefit from skilled therapeutic intervention in order to improve the following deficits and impairments: Improper body mechanics, Decreased range of motion, Decreased activity tolerance, Decreased safety awareness, Decreased strength, Hypermobility, Increased fascial restricitons, Postural dysfunction, Pain, Decreased endurance, Decreased balance, Increased muscle spasms, Decreased mobility, Decreased coordination

## 2017-11-21 DIAGNOSIS — Z6832 Body mass index (BMI) 32.0-32.9, adult: Secondary | ICD-10-CM | POA: Diagnosis not present

## 2017-11-21 DIAGNOSIS — J4520 Mild intermittent asthma, uncomplicated: Secondary | ICD-10-CM | POA: Diagnosis not present

## 2017-11-21 DIAGNOSIS — K582 Mixed irritable bowel syndrome: Secondary | ICD-10-CM | POA: Diagnosis not present

## 2017-11-24 DIAGNOSIS — K5900 Constipation, unspecified: Secondary | ICD-10-CM | POA: Diagnosis not present

## 2017-11-24 DIAGNOSIS — G4730 Sleep apnea, unspecified: Secondary | ICD-10-CM | POA: Diagnosis not present

## 2017-11-24 DIAGNOSIS — E669 Obesity, unspecified: Secondary | ICD-10-CM | POA: Diagnosis not present

## 2017-11-24 DIAGNOSIS — K580 Irritable bowel syndrome with diarrhea: Secondary | ICD-10-CM | POA: Diagnosis not present

## 2017-11-26 NOTE — Addendum Note (Signed)
Addended by: Jerl Mina on: 11/26/2017 05:25 PM   Modules accepted: Orders

## 2017-11-29 ENCOUNTER — Ambulatory Visit: Payer: 59 | Admitting: Physical Therapy

## 2017-11-29 DIAGNOSIS — M6281 Muscle weakness (generalized): Secondary | ICD-10-CM

## 2017-11-29 DIAGNOSIS — R278 Other lack of coordination: Secondary | ICD-10-CM

## 2017-11-29 DIAGNOSIS — R29898 Other symptoms and signs involving the musculoskeletal system: Secondary | ICD-10-CM

## 2017-11-29 NOTE — Therapy (Signed)
[place] MAIN [facility] SERVICES [address] [place], [place], [address] Phone: [phone]   Fax:  [phone]  Physical Therapy Treatment  Patient Details  Name: Karen [name] MRN: [id number] Date of Birth: March 02, 1970 Referring Provider (PT): Jacquiline Doe    Encounter Date: 11/29/2017  PT End of Session - 11/29/17 1426    Visit Number  2    Number of Visits  12    Date for PT Re-Evaluation  02/09/18    PT Start Time  3532    PT Stop Time  1455    PT Time Calculation (min)  40 min    Activity Tolerance  Patient tolerated treatment well    Behavior During Therapy  [facility] for tasks assessed/performed       Past Medical History:  Diagnosis Date  . Adenomyosis   . Arthritis 2013   L knee gets steroid injections   . Asthmatic bronchitis 01/31/2017  . DVT of lower extremity (deep venous thrombosis) ([place])   . Dysmenorrhea   . Endometriosis   . Fibroids   . GERD (gastroesophageal reflux disease)   . Kidney stones   . Menorrhagia   . Migraine   . Recurrent upper respiratory infection (URI)   . Sebaceous cyst of breast, right lower inner quadrant 10/25/2012   Excised 11/21/12   . Syncope, non cardiac     Past Surgical History:  Procedure Laterality Date  . ARTHROSCOPIC HAGLUNDS REPAIR    . BREAST CYST EXCISION Right 11/21/2012   Procedure: CYST EXCISION BREAST;  Surgeon: Haywood Lasso, MD;  Location: [place];  Service: General;  Laterality: Right;  . BREAST EXCISIONAL BIOPSY Right 11/2012  . CESAREAN SECTION    . CHOLECYSTECTOMY    . COLONOSCOPY  05/02/2011   Procedure: COLONOSCOPY;  Surgeon: Winfield Cunas., MD;  Location: [facility];  Service: Endoscopy;  Laterality: N/A;  . colonoscopy  11/04/2014  . ESOPHAGEAL MANOMETRY N/A 11/04/2015   Procedure: ESOPHAGEAL MANOMETRY (EM);  Surgeon: Laurence Spates, MD;  [facility];  Service: Endoscopy;  Laterality: N/A;  . ESOPHAGOGASTRODUODENOSCOPY   05/02/2011   Procedure: ESOPHAGOGASTRODUODENOSCOPY (EGD);  Surgeon: Winfield Cunas., MD;  Location: [facility];  Service: Endoscopy;  Laterality: N/A;  . ESOPHAGOGASTRODUODENOSCOPY N/A 09/01/2014   Procedure: ESOPHAGOGASTRODUODENOSCOPY (EGD);  Surgeon: Laurence Spates, MD;  Location: [facility];  Service: Endoscopy;  Laterality: N/A;  . KNEE SURGERY    . LAPAROSCOPY    . laproscopic abdominal    . [place] IMPEDANCE STUDY N/A 11/04/2015   Procedure: [place] IMPEDANCE STUDY;  Surgeon: Laurence Spates, MD;  [facility];  Service: Endoscopy;  Laterality: N/A;  . VAGINAL HYSTERECTOMY      There were no vitals filed for this visit.  Subjective Assessment - 11/29/17 1417    Subjective  Pt did Zumba 3 x last week. Pt visited her nutritionist at the [facility]. Pt is cutting out gluten and dairy as recommended by nutritionist.  Pt was a recommended to take Senna by her GI doctor but she is not ready to take it yet and would like to try teas instead. Pt has been doing the body scan in the morning. Pt is still trying to find time. Her day is really packed full. Pt noticed some leakage during Zumba.  Pt is interested in going to a yoga class     Pertinent History  Upon discharge from Pelvic PT in April, pt had returned to [place],  maintained pelvic exercises / relaxation fro one month after discharges.  Once she got a respiratory infection in May, she stopped doing all the PT exercises.  Pt was also trying to get use tot he CPAP Tx and took a trip during this time.  Pt had started OT to treat L elbow pain (April-May)  and took took steriods injections. Since Oct, pt started gettign back on track with PT exericses included: body scan, deep core level 1-2, happy baby pose, V slides, pelvic squeezes, and has built back up to Zumba 3 x/ week for last week. Pt is feeling better but she feels she is struggling with keeping up with her plan to exercise, eat better, cook meals, sleep better but she gets  interrupted by her diarrhea / constipation because impact her going to Zumba classes. Pt is looking to work with a nutritionist.       Patient Stated Goals  get to a normal bowel pattern, get back to lift, go out to eat with friends, travel without issues                     Pelvic Floor Special Questions - 11/29/17 1446    External Perineal Exam  tenderness at R.  Contraction of pelvic floor proper coordination                       PT Long Term Goals - 11/29/17 1431      PT LONG TERM GOAL #1   Title  Pt will decrease her ZUNG anxiety score from 50% to <30 % to demo IND with relaxation practices      Time  12    Period  Weeks    Status  On-going      PT LONG TERM GOAL #2   Title  Pt will decrease her COREFO score from 30% to < 15% in order to demo improve GI function and better bowel movements    Time  12    Period  Weeks    Status  On-going      PT LONG TERM GOAL #3   Title  Pt will demo improve less chest breathing and more diaphragmatic excursion to promote parasympathetic nervous system and immune system     Time  4    Period  Weeks    Status  On-going      PT LONG TERM GOAL #4   Title  Pt will report making more time for self-care practices across 2 weeks and report less feeling of feeling overwhelmed in order to progress towards better digestion and sleep     Time  8    Period  Weeks    Status  Partially Met      PT LONG TERM GOAL #5   Title  Pt will report no onset respiratory Sx and subsequent GI Sx with compliance to daily relaxation practices and self-care for 2 weeks.     Time  8    Period  Weeks    Status  On-going      PT LONG TERM GOAL #6   Title  Pt will report increased sleep hours from 4-5 hours to > 8hr for 3 nights per week in order to demo improved sleep quality     Time  8    Period  Weeks    Status  On-going      PT LONG TERM GOAL #7   Title  Pt will report  improved stool consistency from Type 1 to Type 3-5 across 75%  of the time to demo imprve consistency     Time  12    Period  Weeks    Status  New            Plan - 11/29/17 1455    Clinical Impression Statement  Pt required more biopyschosocial approaches, active listening, and motivational interviewing to advance towards sleep related goal. Plan to add yoga practices as part of her down-regulation of nervous system HEP as pt voiced she was interested in learning yoga. Discussed if swallowing difficulties persist, further imaging will be warranted.  Pt continues to benefit from skilled PT.      Rehab Potential  Good    PT Frequency  1x / week    PT Duration  12 weeks    PT Treatment/Interventions  Therapeutic activities;Therapeutic exercise;Functional mobility training;Neuromuscular re-education;Manual techniques;Patient/family education;Moist Heat;Balance training;Stair training;Energy conservation;Taping;Aquatic Therapy;Biofeedback    Consulted and Agree with Plan of Care  Patient       Patient will benefit from skilled therapeutic intervention in order to improve the following deficits and impairments:  Improper body mechanics, Decreased range of motion, Decreased activity tolerance, Decreased safety awareness, Decreased strength, Hypermobility, Increased fascial restricitons, Postural dysfunction, Pain, Decreased endurance, Decreased balance, Increased muscle spasms, Decreased mobility, Decreased coordination  Visit Diagnosis: Muscle weakness (generalized)  Other lack of coordination  Other symptoms and signs involving the musculoskeletal system     Problem List Patient Active Problem List   Diagnosis Date Noted  . Obstructive sleep apnea treated with continuous positive airway pressure (CPAP) 06/16/2017  . Perennial allergic rhinitis with a nonallergic component 01/31/2017  . Asthmatic bronchitis 01/31/2017  . GI symptoms 01/31/2017  . Chest pain 12/13/2012  . Dyspnea 12/13/2012    Jerl Mina ,PT, DPT,  E-RYT  11/29/2017, 3:07 PM  [place] MAIN [facility] SERVICES [address] [place], [place], [address] Phone: [phone]   Fax:  [phone]  Name: Karen [name] MRN: [id number] Date of Birth: 10-18-1970

## 2017-11-29 NOTE — Patient Instructions (Addendum)
Sleep strategies  And priority   Discussed ways to delegate and own up to getting to bed at 9:30pm to sleep at 10pm for 8 hrs  Discussed the risk factors to poor sleep   _________  Chewing food and relaxation practice - 10 slowed breaths and gratitude practice before and after eating.

## 2017-12-06 ENCOUNTER — Ambulatory Visit: Payer: 59 | Admitting: Physical Therapy

## 2017-12-07 MED FILL — RELISTOR 150 MG TABLET: 150 | 30 days supply | Qty: 60 | Fill #3

## 2017-12-12 DIAGNOSIS — Z01411 Encounter for gynecological examination (general) (routine) with abnormal findings: Secondary | ICD-10-CM | POA: Diagnosis not present

## 2017-12-12 DIAGNOSIS — Z6830 Body mass index (BMI) 30.0-30.9, adult: Secondary | ICD-10-CM | POA: Diagnosis not present

## 2017-12-13 ENCOUNTER — Ambulatory Visit: Payer: 59 | Attending: Gastroenterology | Admitting: Physical Therapy

## 2017-12-13 DIAGNOSIS — M25522 Pain in left elbow: Secondary | ICD-10-CM | POA: Insufficient documentation

## 2017-12-13 DIAGNOSIS — M533 Sacrococcygeal disorders, not elsewhere classified: Secondary | ICD-10-CM | POA: Insufficient documentation

## 2017-12-13 DIAGNOSIS — M6281 Muscle weakness (generalized): Secondary | ICD-10-CM | POA: Insufficient documentation

## 2017-12-13 DIAGNOSIS — M25632 Stiffness of left wrist, not elsewhere classified: Secondary | ICD-10-CM | POA: Insufficient documentation

## 2017-12-13 DIAGNOSIS — M791 Myalgia, unspecified site: Secondary | ICD-10-CM | POA: Diagnosis not present

## 2017-12-13 DIAGNOSIS — R278 Other lack of coordination: Secondary | ICD-10-CM | POA: Diagnosis not present

## 2017-12-13 DIAGNOSIS — R29898 Other symptoms and signs involving the musculoskeletal system: Secondary | ICD-10-CM | POA: Insufficient documentation

## 2017-12-13 MED FILL — COLCHICINE 0.6 MG TABS: 0.6 | 30 days supply | Qty: 60 | Fill #2

## 2017-12-13 NOTE — Therapy (Signed)
[place] MAIN [facility] SERVICES [address] [place], [place], [address] Phone: [phone]   Fax:  [phone]  Physical Therapy Treatment  Patient Details  Name: Karen [name] MRN: [id number] Date of Birth: 1970-12-10 Referring Provider (PT): Jacquiline Doe    Encounter Date: 12/13/2017  PT End of Session - 12/13/17 1010    Visit Number  3    Number of Visits  12    Date for PT Re-Evaluation  02/09/18    PT Start Time  0910    PT Stop Time  1010    PT Time Calculation (min)  60 min    Activity Tolerance  Patient tolerated treatment well    Behavior During Therapy  [facility] for tasks assessed/performed       Past Medical History:  Diagnosis Date  . Adenomyosis   . Arthritis 2013   L knee gets steroid injections   . Asthmatic bronchitis 01/31/2017  . DVT of lower extremity (deep venous thrombosis) ([place])   . Dysmenorrhea   . Endometriosis   . Fibroids   . GERD (gastroesophageal reflux disease)   . Kidney stones   . Menorrhagia   . Migraine   . Recurrent upper respiratory infection (URI)   . Sebaceous cyst of breast, right lower inner quadrant 10/25/2012   Excised 11/21/12   . Syncope, non cardiac     Past Surgical History:  Procedure Laterality Date  . ARTHROSCOPIC HAGLUNDS REPAIR    . BREAST CYST EXCISION Right 11/21/2012   Procedure: CYST EXCISION BREAST;  Surgeon: Haywood Lasso, MD;  Location: [facility];  Service: General;  Laterality: Right;  . BREAST EXCISIONAL BIOPSY Right 11/2012  . CESAREAN SECTION    . CHOLECYSTECTOMY    . COLONOSCOPY  05/02/2011   Procedure: COLONOSCOPY;  Surgeon: Winfield Cunas., MD;  Location: [facility];  Service: Endoscopy;  Laterality: N/A;  . colonoscopy  11/04/2014  . ESOPHAGEAL MANOMETRY N/A 11/04/2015   Procedure: ESOPHAGEAL MANOMETRY (EM);  Surgeon: Laurence Spates, MD;  Location: [facility];  Service: Endoscopy;  Laterality: N/A;  . ESOPHAGOGASTRODUODENOSCOPY   05/02/2011   Procedure: ESOPHAGOGASTRODUODENOSCOPY (EGD);  Surgeon: Winfield Cunas., MD;  Location: [facility];  Service: Endoscopy;  Laterality: N/A;  . ESOPHAGOGASTRODUODENOSCOPY N/A 09/01/2014   Procedure: ESOPHAGOGASTRODUODENOSCOPY (EGD);  Surgeon: Laurence Spates, MD;  Location: [facility];  Service: Endoscopy;  Laterality: N/A;  . KNEE SURGERY    . LAPAROSCOPY    . laproscopic abdominal    . [place] IMPEDANCE STUDY N/A 11/04/2015   Procedure: [place] IMPEDANCE STUDY;  Surgeon: Laurence Spates, MD;  Location: [facility];  Service: Endoscopy;  Laterality: N/A;  . VAGINAL HYSTERECTOMY      There were no vitals filed for this visit.  Subjective Assessment - 12/13/17 1108    Subjective  Pt reported her urinary leakage has improved. Pt states she feel bloated in her abdomen today     Pertinent History  Upon discharge from Pelvic PT in April, pt had returned to [place], maintained pelvic exercises / relaxation fro one month after discharges.  Once she got a respiratory infection in May, she stopped doing all the PT exercises.  Pt was also trying to get use tot he CPAP Tx and took a trip during this time.  Pt had started OT to treat L elbow pain (April-May)  and took took steriods injections. Since Oct, pt started gettign back on track with PT exericses included: body scan, deep core  level 1-2, happy baby pose, V slides, pelvic squeezes, and has built back up to Zumba 3 x/ week for last week. Pt is feeling better but she feels she is struggling with keeping up with her plan to exercise, eat better, cook meals, sleep better but she gets interrupted by her diarrhea / constipation because impact her going to Zumba classes. Pt is looking to work with a nutritionist.       Patient Stated Goals  get to a normal bowel pattern, get back to lift, go out to eat with friends, travel without issues                     Pelvic Floor Special Questions - 12/13/17 1019    External Perineal Exam  Increased at  B coccyx, anterior triangle  ( decreased with cues for co-activation with lower kinetic chain)         [facility] Adult PT Treatment/Exercise - 12/13/17 1109      Therapeutic Activites    Therapeutic Activities  --   see pt instructions     Neuro Re-ed    Neuro Re-ed Details   see pt instructions      Moist Heat Therapy   Number Minutes Moist Heat  5 Minutes    Moist Heat Location  Other (comment)   abdomen      Manual Therapy   Manual therapy comments  fascial releases at pelvic floor externally,  fsacial release at lower abdomen                    PT Long Term Goals - 11/29/17 1431      PT LONG TERM GOAL #1   Title  Pt will decrease her ZUNG anxiety score from 50% to <30 % to demo IND with relaxation practices      Time  12    Period  Weeks    Status  On-going      PT LONG TERM GOAL #2   Title  Pt will decrease her COREFO score from 30% to < 15% in order to demo improve GI function and better bowel movements    Time  12    Period  Weeks    Status  On-going      PT LONG TERM GOAL #3   Title  Pt will demo improve less chest breathing and more diaphragmatic excursion to promote parasympathetic nervous system and immune system     Time  4    Period  Weeks    Status  On-going      PT LONG TERM GOAL #4   Title  Pt will report making more time for self-care practices across 2 weeks and report less feeling of feeling overwhelmed in order to progress towards better digestion and sleep     Time  8    Period  Weeks    Status  Partially Met      PT LONG TERM GOAL #5   Title  Pt will report no onset respiratory Sx and subsequent GI Sx with compliance to daily relaxation practices and self-care for 2 weeks.     Time  8    Period  Weeks    Status  On-going      PT LONG TERM GOAL #6   Title  Pt will report increased sleep hours from 4-5 hours to > 8hr for 3 nights per week in order to demo improved sleep quality     Time  8    Period  Weeks    Status  On-going       PT LONG TERM GOAL #7   Title  Pt will report improved stool consistency from Type 1 to Type 3-5 across 75% of the time to demo imprve consistency     Time  12    Period  Weeks    Status  New            Plan - 12/13/17 1011    Clinical Impression Statement  Pt showed improved mobility of diaphragm and C-section scars following Tx today. Pt showed a more soft abdomen after Tx. Pt continues to maintain self-care practices to decrease stress.  Educated about making time for eating and not multi-tasking and explained about the physiology about nn system. Pt showed minimal pelvic floor tightness and was able to demo relaxation of pelvic floor with cues for more lower kinetich chain co-activation. Ptreports her urinary Sx have improved since last session. Pt continues to benefit from skilled PT.      Rehab Potential  Good    PT Frequency  1x / week    PT Duration  12 weeks    PT Treatment/Interventions  Therapeutic activities;Therapeutic exercise;Functional mobility training;Neuromuscular re-education;Manual techniques;Patient/family education;Moist Heat;Balance training;Stair training;Energy conservation;Taping;Aquatic Therapy;Biofeedback    Consulted and Agree with Plan of Care  Patient       Patient will benefit from skilled therapeutic intervention in order to improve the following deficits and impairments:  Improper body mechanics, Decreased range of motion, Decreased activity tolerance, Decreased safety awareness, Decreased strength, Hypermobility, Increased fascial restricitons, Postural dysfunction, Pain, Decreased endurance, Decreased balance, Increased muscle spasms, Decreased mobility, Decreased coordination  Visit Diagnosis: Muscle weakness (generalized)  Other lack of coordination  Other symptoms and signs involving the musculoskeletal system  Pain in left elbow  Stiffness of left wrist, not elsewhere classified  Myalgia     Problem List Patient Active Problem List    Diagnosis Date Noted  . Obstructive sleep apnea treated with continuous positive airway pressure (CPAP) 06/16/2017  . Perennial allergic rhinitis with a nonallergic component 01/31/2017  . Asthmatic bronchitis 01/31/2017  . GI symptoms 01/31/2017  . Chest pain 12/13/2012  . Dyspnea 12/13/2012    Jerl Mina ,PT, DPT, E-RYT  12/13/2017, 11:11 AM  [place] MAIN [facility] SERVICES [address] [place], [place], [address] Phone: [phone]   Fax:  [phone]  Name: Karen [name] MRN: [id number] Date of Birth: 04-Nov-1970

## 2017-12-13 NOTE — Patient Instructions (Signed)
Colon massage  Start L lower abdomen counterclockwise direction 5 x reps    Rocking knees side to side with palms gently pressed by the C-section scar.    _____  When deep core level 1  Ribcage expansion  On inhale Ribcage knit down on exhale  DIAPHRAGM MOVEMENT AS A PUMP FOR DIGESTION AND RELAXATIOn

## 2017-12-25 ENCOUNTER — Ambulatory Visit: Payer: 59 | Admitting: Physical Therapy

## 2017-12-25 DIAGNOSIS — R278 Other lack of coordination: Secondary | ICD-10-CM | POA: Diagnosis not present

## 2017-12-25 DIAGNOSIS — M25632 Stiffness of left wrist, not elsewhere classified: Secondary | ICD-10-CM | POA: Diagnosis not present

## 2017-12-25 DIAGNOSIS — M6281 Muscle weakness (generalized): Secondary | ICD-10-CM

## 2017-12-25 DIAGNOSIS — M791 Myalgia, unspecified site: Secondary | ICD-10-CM | POA: Diagnosis not present

## 2017-12-25 DIAGNOSIS — M25522 Pain in left elbow: Secondary | ICD-10-CM | POA: Diagnosis not present

## 2017-12-25 DIAGNOSIS — R29898 Other symptoms and signs involving the musculoskeletal system: Secondary | ICD-10-CM

## 2017-12-25 DIAGNOSIS — M533 Sacrococcygeal disorders, not elsewhere classified: Secondary | ICD-10-CM | POA: Diagnosis not present

## 2017-12-25 NOTE — Therapy (Signed)
[place] MAIN [facility] SERVICES [address] [place], [place], [address] Phone: [phone]   Fax:  [phone]  Physical Therapy Treatment  Patient Details  Name: Karen [name] MRN: [id number] Date of Birth: Aug 21, 1970 Referring Provider (PT): Jacquiline Doe    Encounter Date: 12/25/2017  PT End of Session - 12/25/17 1652    Visit Number  4    Number of Visits  12    Date for PT Re-Evaluation  02/09/18    PT Start Time  5859    PT Stop Time  1650    PT Time Calculation (min)  45 min    Activity Tolerance  Patient tolerated treatment well    Behavior During Therapy  [facility] for tasks assessed/performed       Past Medical History:  Diagnosis Date  . Adenomyosis   . Arthritis 2013   L knee gets steroid injections   . Asthmatic bronchitis 01/31/2017  . DVT of lower extremity (deep venous thrombosis) ([place])   . Dysmenorrhea   . Endometriosis   . Fibroids   . GERD (gastroesophageal reflux disease)   . Kidney stones   . Menorrhagia   . Migraine   . Recurrent upper respiratory infection (URI)   . Sebaceous cyst of breast, right lower inner quadrant 10/25/2012   Excised 11/21/12   . Syncope, non cardiac     Past Surgical History:  Procedure Laterality Date  . ARTHROSCOPIC HAGLUNDS REPAIR    . BREAST CYST EXCISION Right 11/21/2012   Procedure: CYST EXCISION BREAST;  Surgeon: Haywood Lasso, MD;  Location: [facility];  Service: General;  Laterality: Right;  . BREAST EXCISIONAL BIOPSY Right 11/2012  . CESAREAN SECTION    . CHOLECYSTECTOMY    . COLONOSCOPY  05/02/2011   Procedure: COLONOSCOPY;  Surgeon: Winfield Cunas., MD;  Location: [facility];  Service: Endoscopy;  Laterality: N/A;  . colonoscopy  11/04/2014  . ESOPHAGEAL MANOMETRY N/A 11/04/2015   Procedure: ESOPHAGEAL MANOMETRY (EM);  Surgeon: Laurence Spates, MD;  [facility];  Service: Endoscopy;  Laterality: N/A;  . ESOPHAGOGASTRODUODENOSCOPY   05/02/2011   Procedure: ESOPHAGOGASTRODUODENOSCOPY (EGD);  Surgeon: Winfield Cunas., MD;  Location: [facility];  Service: Endoscopy;  Laterality: N/A;  . ESOPHAGOGASTRODUODENOSCOPY N/A 09/01/2014   Procedure: ESOPHAGOGASTRODUODENOSCOPY (EGD);  Surgeon: Laurence Spates, MD;  Location: [facility];  Service: Endoscopy;  Laterality: N/A;  . KNEE SURGERY    . LAPAROSCOPY    . laproscopic abdominal    . [place] IMPEDANCE STUDY N/A 11/04/2015   Procedure: [place] IMPEDANCE STUDY;  Surgeon: Laurence Spates, MD;  [facility];  Service: Endoscopy;  Laterality: N/A;  . VAGINAL HYSTERECTOMY      There were no vitals filed for this visit.  Subjective Assessment - 12/25/17 1603    Subjective  Pt reported her bloating feeling was better after last session. Pt had more bowel movements, less straining. Pt is getting more sleep and continues to maintain Zumba classes for 3 x week. Urinary leakage has been better during Zumba and with sneezing and coughing.  Pt noticed a big difference with the choking which has been less since she is making time to sit and eat instead of rushing to eat or eating on the run or multitasking while eating.     Pertinent History  Upon discharge from Pelvic PT in April, pt had returned to [place], maintained pelvic exercises / relaxation fro one month after discharges.  Once she got  a respiratory infection in May, she stopped doing all the PT exercises.  Pt was also trying to get use tot he CPAP Tx and took a trip during this time.  Pt had started OT to treat L elbow pain (April-May)  and took took steriods injections. Since Oct, pt started gettign back on track with PT exericses included: body scan, deep core level 1-2, happy baby pose, V slides, pelvic squeezes, and has built back up to Zumba 3 x/ week for last week. Pt is feeling better but she feels she is struggling with keeping up with her plan to exercise, eat better, cook meals, sleep better but she gets interrupted by her diarrhea  / constipation because impact her going to Zumba classes. Pt is looking to work with a nutritionist.       Patient Stated Goals  get to a normal bowel pattern, get back to lift, go out to eat with friends, travel without issues          [facility] PT Assessment - 12/25/17 1649      Palpation   Palpation comment  increased R fascial restrictions along R C0section scar to medial to iliac crest                 Pelvic Floor Special Questions - 12/25/17 1650    External Perineal Exam  no tensions at B coccyx, anterior triangle         [facility] Adult PT Treatment/Exercise - 12/25/17 1606      Therapeutic Activites    Therapeutic Activities  --   see pt instructions     Neuro Re-ed    Neuro Re-ed Details   --      Moist Heat Therapy   Moist Heat Location  Other (comment)   abdomen, in prone, pillow under thighs, bolster under shins      Manual Therapy   Manual therapy comments  fascial releases in hooklyinh MWM and sidelying, and prone                   PT Long Term Goals - 11/29/17 1431      PT LONG TERM GOAL #1   Title  Pt will decrease her ZUNG anxiety score from 50% to <30 % to demo IND with relaxation practices      Time  12    Period  Weeks    Status  On-going      PT LONG TERM GOAL #2   Title  Pt will decrease her COREFO score from 30% to < 15% in order to demo improve GI function and better bowel movements    Time  12    Period  Weeks    Status  On-going      PT LONG TERM GOAL #3   Title  Pt will demo improve less chest breathing and more diaphragmatic excursion to promote parasympathetic nervous system and immune system     Time  4    Period  Weeks    Status  On-going      PT LONG TERM GOAL #4   Title  Pt will report making more time for self-care practices across 2 weeks and report less feeling of feeling overwhelmed in order to progress towards better digestion and sleep     Time  8    Period  Weeks    Status  Partially Met      PT LONG  TERM GOAL #5   Title  Pt will  report no onset respiratory Sx and subsequent GI Sx with compliance to daily relaxation practices and self-care for 2 weeks.     Time  8    Period  Weeks    Status  On-going      PT LONG TERM GOAL #6   Title  Pt will report increased sleep hours from 4-5 hours to > 8hr for 3 nights per week in order to demo improved sleep quality     Time  8    Period  Weeks    Status  On-going      PT LONG TERM GOAL #7   Title  Pt will report improved stool consistency from Type 1 to Type 3-5 across 75% of the time to demo imprve consistency     Time  12    Period  Weeks    Status  New            Plan - 12/25/17 1652    Clinical Impression Statement  Pt demo'd decreased pelvic floor tightness compared to previous session which attributed to her improvements with bowel movements in terms of softer stool consistency and more frequency/ volume this past week. Pt also reported improved urinary leakage.  Continued to address increased fascial restrictions R lateral aspect of C-section scars which decreased post Tx today. Pt continues to make positive lifestyle behavorial changes with sleep, eating, and destressing to down regulate her nervous system. Pt reports her choking has been less because she no longer rushes nor multitask with eating.  Pt will see a nutritionist on 01/16/18. Plan to perform a intrarectal assessment next session to optimize better fascial mobility for bowel elimination.      Rehab Potential  Good    PT Frequency  1x / week    PT Duration  12 weeks    PT Treatment/Interventions  Therapeutic activities;Therapeutic exercise;Functional mobility training;Neuromuscular re-education;Manual techniques;Patient/family education;Moist Heat;Balance training;Stair training;Energy conservation;Taping;Aquatic Therapy;Biofeedback    Consulted and Agree with Plan of Care  Patient       Patient will benefit from skilled therapeutic intervention in order to improve the  following deficits and impairments:  Improper body mechanics, Decreased range of motion, Decreased activity tolerance, Decreased safety awareness, Decreased strength, Hypermobility, Increased fascial restricitons, Postural dysfunction, Pain, Decreased endurance, Decreased balance, Increased muscle spasms, Decreased mobility, Decreased coordination  Visit Diagnosis: Muscle weakness (generalized)  Other lack of coordination  Other symptoms and signs involving the musculoskeletal system     Problem List Patient Active Problem List   Diagnosis Date Noted  . Obstructive sleep apnea treated with continuous positive airway pressure (CPAP) 06/16/2017  . Perennial allergic rhinitis with a nonallergic component 01/31/2017  . Asthmatic bronchitis 01/31/2017  . GI symptoms 01/31/2017  . Chest pain 12/13/2012  . Dyspnea 12/13/2012    Jerl Mina ,PT, DPT, E-RYT  12/25/2017, 4:58 PM  [place] MAIN [facility] SERVICES [address] [place], [place], [address] Phone: [phone]   Fax:  [phone]  Name: Karen [name] MRN: [id number] Date of Birth: [birth year]

## 2017-12-25 NOTE — Patient Instructions (Signed)
Abdominal massage starts low R ab to upper R ab beneath the ribs, to upper L and lower L  5 reps   Circles   _____

## 2018-01-02 ENCOUNTER — Ambulatory Visit: Payer: 59 | Admitting: Physical Therapy

## 2018-01-02 DIAGNOSIS — M791 Myalgia, unspecified site: Secondary | ICD-10-CM | POA: Diagnosis not present

## 2018-01-02 DIAGNOSIS — M6281 Muscle weakness (generalized): Secondary | ICD-10-CM | POA: Diagnosis not present

## 2018-01-02 DIAGNOSIS — M25522 Pain in left elbow: Secondary | ICD-10-CM | POA: Diagnosis not present

## 2018-01-02 DIAGNOSIS — M533 Sacrococcygeal disorders, not elsewhere classified: Secondary | ICD-10-CM | POA: Diagnosis not present

## 2018-01-02 DIAGNOSIS — M25632 Stiffness of left wrist, not elsewhere classified: Secondary | ICD-10-CM | POA: Diagnosis not present

## 2018-01-02 DIAGNOSIS — R278 Other lack of coordination: Secondary | ICD-10-CM

## 2018-01-02 DIAGNOSIS — R29898 Other symptoms and signs involving the musculoskeletal system: Secondary | ICD-10-CM

## 2018-01-02 NOTE — Therapy (Signed)
[place] MAIN [facility] SERVICES [address] [place], [place], [address] Phone: [phone]   Fax:  [phone]  Physical Therapy Treatment  Patient Details  Name: Karen [name] MRN: [id number] Date of Birth: 1970-02-06 Referring Provider (PT): Jacquiline Doe    Encounter Date: 01/02/2018  PT End of Session - 01/02/18 0900    Visit Number  5    Number of Visits  12    Date for PT Re-Evaluation  02/09/18    PT Start Time  0815    PT Stop Time  0900    PT Time Calculation (min)  45 min    Activity Tolerance  Patient tolerated treatment well    Behavior During Therapy  [facility] for tasks assessed/performed       Past Medical History:  Diagnosis Date  . Adenomyosis   . Arthritis 2013   L knee gets steroid injections   . Asthmatic bronchitis 01/31/2017  . DVT of lower extremity (deep venous thrombosis) ([place])   . Dysmenorrhea   . Endometriosis   . Fibroids   . GERD (gastroesophageal reflux disease)   . Kidney stones   . Menorrhagia   . Migraine   . Recurrent upper respiratory infection (URI)   . Sebaceous cyst of breast, right lower inner quadrant 10/25/2012   Excised 11/21/12   . Syncope, non cardiac     Past Surgical History:  Procedure Laterality Date  . ARTHROSCOPIC HAGLUNDS REPAIR    . BREAST CYST EXCISION Right 11/21/2012   Procedure: CYST EXCISION BREAST;  Surgeon: Haywood Lasso, MD;  Location: [place];  Service: General;  Laterality: Right;  . BREAST EXCISIONAL BIOPSY Right 11/2012  . CESAREAN SECTION    . CHOLECYSTECTOMY    . COLONOSCOPY  05/02/2011   Procedure: COLONOSCOPY;  Surgeon: Winfield Cunas., MD;  Location: [facility];  Service: Endoscopy;  Laterality: N/A;  . colonoscopy  11/04/2014  . ESOPHAGEAL MANOMETRY N/A 11/04/2015   Procedure: ESOPHAGEAL MANOMETRY (EM);  Surgeon: Laurence Spates, MD;  Location: [facility];  Service: Endoscopy;  Laterality: N/A;  . ESOPHAGOGASTRODUODENOSCOPY   05/02/2011   Procedure: ESOPHAGOGASTRODUODENOSCOPY (EGD);  Surgeon: Winfield Cunas., MD;  Location: [facility];  Service: Endoscopy;  Laterality: N/A;  . ESOPHAGOGASTRODUODENOSCOPY N/A 09/01/2014   Procedure: ESOPHAGOGASTRODUODENOSCOPY (EGD);  Surgeon: Laurence Spates, MD;  Location: [facility];  Service: Endoscopy;  Laterality: N/A;  . KNEE SURGERY    . LAPAROSCOPY    . laproscopic abdominal    . [place] IMPEDANCE STUDY N/A 11/04/2015   Procedure: [place] IMPEDANCE STUDY;  Surgeon: Laurence Spates, MD;  Location: [facility];  Service: Endoscopy;  Laterality: N/A;  . VAGINAL HYSTERECTOMY      There were no vitals filed for this visit.  Subjective Assessment - 01/02/18 0817    Subjective  Pt reports urinary leakage continues to be better. For the past 2 weeks, pt has not had diarrhea. Pt had constipation for 2 days when traveling. Pt felt she had to strain due the stool being hard and not soft. For the past 2 days, stool has been more like logs. Pt continues to massage her abdomen.       Pertinent History  Upon discharge from Pelvic PT in April, pt had returned to [place], maintained pelvic exercises / relaxation fro one month after discharges.  Once she got a respiratory infection in May, she stopped doing all the PT exercises.  Pt was also trying to get use tot  he CPAP Tx and took a trip during this time.  Pt had started OT to treat L elbow pain (April-May)  and took took steriods injections. Since Oct, pt started gettign back on track with PT exericses included: body scan, deep core level 1-2, happy baby pose, V slides, pelvic squeezes, and has built back up to Zumba 3 x/ week for last week. Pt is feeling better but she feels she is struggling with keeping up with her plan to exercise, eat better, cook meals, sleep better but she gets interrupted by her diarrhea / constipation because impact her going to Zumba classes. Pt is looking to work with a nutritionist.       Patient Stated Goals  get to a normal  bowel pattern, get back to lift, go out to eat with friends, travel without issues          [facility] PT Assessment - 01/02/18 0857      Palpation   Spinal mobility  T/L junction tightness at paraspinals/ intercostals posterior B , fascial restrictions over sternum, limited depression of thorax    SI assessment   increased tightness R medial aspect of iliac crest, subcostal angle R > L                     [facility] Adult PT Treatment/Exercise - 01/02/18 6384      Neuro Re-ed    Neuro Re-ed Details   see pt instructions      Manual Therapy   Manual therapy comments  fascial releases at problem areas with MWM, sidelying upper trunk rotation and R hip ext/ flex                   PT Long Term Goals - 01/02/18 0819      PT LONG TERM GOAL #1   Title  Pt will decrease her ZUNG anxiety score from 50% to <30 % to demo IND with relaxation practices      Time  12    Period  Weeks    Status  On-going      PT LONG TERM GOAL #2   Title  Pt will decrease her COREFO score from 30% to < 15% in order to demo improve GI function and better bowel movements    Time  12    Period  Weeks    Status  On-going      PT LONG TERM GOAL #3   Title  Pt will demo improve less chest breathing and more diaphragmatic excursion to promote parasympathetic nervous system and immune system     Time  4    Period  Weeks    Status  On-going      PT LONG TERM GOAL #4   Title  Pt will report making more time for self-care practices across 2 weeks and report less feeling of feeling overwhelmed in order to progress towards better digestion and sleep     Time  8    Period  Weeks    Status  Partially Met      PT LONG TERM GOAL #5   Title  Pt will report no onset respiratory Sx and subsequent GI Sx with compliance to daily relaxation practices and self-care for 2 weeks.     Time  8    Period  Weeks    Status  On-going      PT LONG TERM GOAL #6   Title  Pt will report increased sleep hours  from  4-5 hours to > 8hr for 3 nights per week in order to demo improved sleep quality     Time  8    Period  Weeks    Status  On-going      PT LONG TERM GOAL #7   Title  Pt will report improved stool consistency from Type 1 to Type 3-5 across 75% of the time to demo imprve consistency     Time  12    Period  Weeks    Status  New            Plan - 01/02/18 0900    Clinical Impression Statement  Pt's abdominal fascial restrictions are decreasing. Manual Tx today helped to promote posterior, anterior, lateral expansion of diaphragm with depression of thorax for optimal exhalation.  These improvements will continue to promote motility for better bowel function. Pt reoprts she has not had diarrhea for the past weeks. Pt also had softer stools the past 2 days after a period of constipation when traveling. Pt remains compliant to self care and relaxation practices which is making a major impact to her progress. Pt is incorporating more fiber. Pt continues to benefit from skilled PT.     Rehab Potential  Good    PT Frequency  1x / week    PT Duration  12 weeks    PT Treatment/Interventions  Therapeutic activities;Therapeutic exercise;Functional mobility training;Neuromuscular re-education;Manual techniques;Patient/family education;Moist Heat;Balance training;Stair training;Energy conservation;Taping;Aquatic Therapy;Biofeedback    Consulted and Agree with Plan of Care  Patient       Patient will benefit from skilled therapeutic intervention in order to improve the following deficits and impairments:  Improper body mechanics, Decreased range of motion, Decreased activity tolerance, Decreased safety awareness, Decreased strength, Hypermobility, Increased fascial restricitons, Postural dysfunction, Pain, Decreased endurance, Decreased balance, Increased muscle spasms, Decreased mobility, Decreased coordination  Visit Diagnosis: Muscle weakness (generalized)  Other lack of coordination  Other  symptoms and signs involving the musculoskeletal system     Problem List Patient Active Problem List   Diagnosis Date Noted  . Obstructive sleep apnea treated with continuous positive airway pressure (CPAP) 06/16/2017  . Perennial allergic rhinitis with a nonallergic component 01/31/2017  . Asthmatic bronchitis 01/31/2017  . GI symptoms 01/31/2017  . Chest pain 12/13/2012  . Dyspnea 12/13/2012    Jerl Mina ,PT, DPT, E-RYT  01/02/2018, 9:03 AM  [place] MAIN [facility] SERVICES [address] [place], [place], [address] Phone: [phone]   Fax:  [phone]  Name: Karen [name] MRN: [id number] Date of Birth: Aug 29, 1970

## 2018-01-02 NOTE — Patient Instructions (Addendum)
Wear a bra extensor to promote more lateral/posterior  expansion of ribs for diaphragm movement  Open book 15 reps  ( handout)  And angel wings 10 reps   Visualization exercises    Continue with your self care practices,  [place] job, [place]!

## 2018-01-04 MED FILL — XIIDRA 5% EYE DROPS: 5 | 30 days supply | Qty: 60 | Fill #2

## 2018-01-05 MED FILL — RELISTOR 150 MG TABLET: 150 | 30 days supply | Qty: 60 | Fill #4

## 2018-01-08 ENCOUNTER — Ambulatory Visit: Payer: 59 | Admitting: Physical Therapy

## 2018-01-08 MED FILL — COLCHICINE 0.6 MG TABS: 0.6 | 30 days supply | Qty: 60 | Fill #3

## 2018-01-10 DIAGNOSIS — K5901 Slow transit constipation: Secondary | ICD-10-CM | POA: Diagnosis not present

## 2018-01-10 DIAGNOSIS — K449 Diaphragmatic hernia without obstruction or gangrene: Secondary | ICD-10-CM | POA: Diagnosis not present

## 2018-01-10 DIAGNOSIS — K219 Gastro-esophageal reflux disease without esophagitis: Secondary | ICD-10-CM | POA: Diagnosis not present

## 2018-01-10 DIAGNOSIS — G8929 Other chronic pain: Secondary | ICD-10-CM | POA: Diagnosis not present

## 2018-01-10 DIAGNOSIS — K222 Esophageal obstruction: Secondary | ICD-10-CM | POA: Diagnosis not present

## 2018-01-10 DIAGNOSIS — R109 Unspecified abdominal pain: Secondary | ICD-10-CM | POA: Diagnosis not present

## 2018-01-15 ENCOUNTER — Ambulatory Visit: Payer: 59 | Attending: Gastroenterology | Admitting: Physical Therapy

## 2018-01-15 DIAGNOSIS — R29898 Other symptoms and signs involving the musculoskeletal system: Secondary | ICD-10-CM | POA: Insufficient documentation

## 2018-01-15 DIAGNOSIS — R278 Other lack of coordination: Secondary | ICD-10-CM | POA: Insufficient documentation

## 2018-01-15 DIAGNOSIS — M25522 Pain in left elbow: Secondary | ICD-10-CM | POA: Diagnosis not present

## 2018-01-15 DIAGNOSIS — M791 Myalgia, unspecified site: Secondary | ICD-10-CM | POA: Insufficient documentation

## 2018-01-15 DIAGNOSIS — M25632 Stiffness of left wrist, not elsewhere classified: Secondary | ICD-10-CM | POA: Diagnosis not present

## 2018-01-15 DIAGNOSIS — M6281 Muscle weakness (generalized): Secondary | ICD-10-CM | POA: Diagnosis not present

## 2018-01-15 NOTE — Patient Instructions (Signed)
1) Stretches for side ribs:  onsidelying   reach hand against mattress , pull elbow away to lengthen the side ribs    2) stretch for mid/low b ack  Reclined twist for hips and side of the hips/ legs  Lay on your back, knees bend Scoot hips to the R , leave shoulders in place Drop knees to the L side resting onto pillows to keep leg at the same width of hips Pillow under L thigh to minimize too much strain   3) resistance breathing on belly, feeling the front ribs expand, back ribs expand

## 2018-01-15 NOTE — Therapy (Signed)
[place] MAIN [facility] SERVICES [address] [place], [place], [address] Phone: [phone]   Fax:  [phone]  Physical Therapy Treatment / Progress Note  Patient Details  Name: Karen [name] MRN: [id number] Date of Birth: 1970-07-01 Referring Provider (PT): Jacquiline Doe    Encounter Date: 01/15/2018  PT End of Session - 01/15/18 1041    Visit Number  6    Number of Visits  12    Date for PT Re-Evaluation  04/09/2018     PT Start Time  0904    PT Stop Time  1002    PT Time Calculation (min)  58 min    Activity Tolerance  Patient tolerated treatment well    Behavior During Therapy  [facility] for tasks assessed/performed       Past Medical History:  Diagnosis Date  . Adenomyosis   . Arthritis 2013   L knee gets steroid injections   . Asthmatic bronchitis 01/31/2017  . DVT of lower extremity (deep venous thrombosis) ([place])   . Dysmenorrhea   . Endometriosis   . Fibroids   . GERD (gastroesophageal reflux disease)   . Kidney stones   . Menorrhagia   . Migraine   . Recurrent upper respiratory infection (URI)   . Sebaceous cyst of breast, right lower inner quadrant 10/25/2012   Excised 11/21/12   . Syncope, non cardiac     Past Surgical History:  Procedure Laterality Date  . ARTHROSCOPIC HAGLUNDS REPAIR    . BREAST CYST EXCISION Right 11/21/2012   Procedure: CYST EXCISION BREAST;  Surgeon: Haywood Lasso, MD;  Location: [place];  Service: General;  Laterality: Right;  . BREAST EXCISIONAL BIOPSY Right 11/2012  . CESAREAN SECTION    . CHOLECYSTECTOMY    . COLONOSCOPY  05/02/2011   Procedure: COLONOSCOPY;  Surgeon: Winfield Cunas., MD;  Location: [facility];  Service: Endoscopy;  Laterality: N/A;  . colonoscopy  11/04/2014  . ESOPHAGEAL MANOMETRY N/A 11/04/2015   Procedure: ESOPHAGEAL MANOMETRY (EM);  Surgeon: Laurence Spates, MD;  Location: [facility];  Service: Endoscopy;  Laterality: N/A;  .  ESOPHAGOGASTRODUODENOSCOPY  05/02/2011   Procedure: ESOPHAGOGASTRODUODENOSCOPY (EGD);  Surgeon: Winfield Cunas., MD;  Location: [facility];  Service: Endoscopy;  Laterality: N/A;  . ESOPHAGOGASTRODUODENOSCOPY N/A 09/01/2014   Procedure: ESOPHAGOGASTRODUODENOSCOPY (EGD);  Surgeon: Laurence Spates, MD;  Location: [facility];  Service: Endoscopy;  Laterality: N/A;  . KNEE SURGERY    . LAPAROSCOPY    . laproscopic abdominal    . [place] IMPEDANCE STUDY N/A 11/04/2015   Procedure: [place] IMPEDANCE STUDY;  Surgeon: Laurence Spates, MD;  [facility]: [facility];  Service: Endoscopy;  Laterality: N/A;  . VAGINAL HYSTERECTOMY      There were no vitals filed for this visit.  Subjective Assessment - 01/15/18 0907    Subjective  Pt reported she has not diarrhea for 2 weeks. Stool consistency is getting better but it still varies between soft and hard but not loose. Pt is still not multitasking and sitting down to eat.  Pt noticed her respiratory issues are better and she did not have to take any medicines when she felt the beginnings of a runny nose and stuffy head.  Her heart burn is worst but she is taking something for it.    (Pended)     Pertinent History  Upon discharge from Pelvic PT in April, pt had returned to [place], maintained pelvic exercises / relaxation fro one month after  discharges.  Once she got a respiratory infection in May, she stopped doing all the PT exercises.  Pt was also trying to get use tot he CPAP Tx and took a trip during this time.  Pt had started OT to treat L elbow pain (April-May)  and took took steriods injections. Since Oct, pt started gettign back on track with PT exericses included: body scan, deep core level 1-2, happy baby pose, V slides, pelvic squeezes, and has built back up to Zumba 3 x/ week for last week. Pt is feeling better but she feels she is struggling with keeping up with her plan to exercise, eat better, cook meals, sleep better but she gets interrupted by her diarrhea /  constipation because impact her going to Zumba classes. Pt is looking to work with a nutritionist.       Patient Stated Goals  get to a normal bowel pattern, get back to lift, go out to eat with friends, travel without issues          [facility] PT Assessment - 01/15/18 0958      Palpation   Palpation comment  limited lateral/ posterior excursion at B posterior ribs ( R> L), tight iliocostalis, subscapularis R , fascial restrictions anterior upper quadrant                    [facility] Adult PT Treatment/Exercise - 01/15/18 0959      Neuro Re-ed    Neuro Re-ed Details   see pt instructions, required cues for lateral/ posterior excursion and depression of ribs  for optimal mobility and pressure system for digestion and bowel elimination      Moist Heat Therapy   Number Minutes Moist Heat  5 Minutes    Moist Heat Location  Lumbar Spine   thoracic      Manual Therapy   Manual therapy comments  STM, fascial releases over peoblem areas noted in assessment (w/ MWM)                    PT Long Term Goals - 01/15/18 0908      PT LONG TERM GOAL #1   Title  Pt will decrease her ZUNG anxiety score from 50% to <30 % to demo IND with relaxation practices      Time  12    Period  Weeks    Status  On-going      PT LONG TERM GOAL #2   Title  Pt will decrease her COREFO score from 30% to < 15% in order to demo improve GI function and better bowel movements    Time  12    Period  Weeks    Status  On-going      PT LONG TERM GOAL #3   Title  Pt will demo improve less chest breathing and more diaphragmatic excursion to promote parasympathetic nervous system and immune system     Time  4    Period  Weeks    Status  Achieved      PT LONG TERM GOAL #4   Title  Pt will report making more time for self-care practices across 2 weeks and report less feeling of feeling overwhelmed in order to progress towards better digestion and sleep     Time  8    Period  Weeks    Status   Achieved      PT LONG TERM GOAL #5   Title  Pt will report no onset  respiratory Sx and subsequent GI Sx with compliance to daily relaxation practices and self-care for 2 weeks.     Time  8    Period  Weeks    Status  On-going      PT LONG TERM GOAL #6   Title  Pt will report increased sleep hours from 4-5 hours to > 8hr for 3 nights per week in order to demo improved sleep quality     Time  8    Period  Weeks    Status  On-going      PT LONG TERM GOAL #7   Title  Pt will report improved stool consistency from Type 1 to Type 3-5 across 75% of the time to demo imprve consistency     Time  12    Period  Weeks    Status  Partially Met            Plan - 01/15/18 1042    Clinical Impression Statement Pt has achieved 2/7 goals and is progressing well towards her remaining goals. Manual Tx to release abdomen fascial restrictions 2/2 C-section scars and dyscoordinated breathing strategies have yielded better  use of the diaphragm and motility.  Pt reported she has not diarrhea for 2 weeks. Stool consistency is getting better but it still varies between soft and hard but not loose. Pt is still not multitasking and sitting down to eat as advised. Pt continues to practice self-care habits to manage her stress and busy schedule. Her ability to swallow food has also improved. Pt will be meeting a nutritionist this week to learn better healthier eating habits. Pt noticed her respiratory issues are better and she did not have to take any medicines when she felt the beginnings of a runny nose and stuffy head.    Today, addressed posterior intercostals/ thoracic region with manual Tx and pt demonstrated improved diaphragmatic excursion posteriorly and rib depression post Tx.  . Pt tolerated without complaints. Anticipate her increased diaphragmatic mobility will continue to help her improve her motility and bowel elimination. Plan to perform rectal assessment at upcoming session to minimize straining  with bowel movements. Pt continues to benefit from skilled PT.     Rehab Potential  Good    PT Frequency  1x / week    PT Duration  12 weeks    PT Treatment/Interventions  Therapeutic activities;Therapeutic exercise;Functional mobility training;Neuromuscular re-education;Manual techniques;Patient/family education;Moist Heat;Balance training;Stair training;Energy conservation;Taping;Aquatic Therapy;Biofeedback    Consulted and Agree with Plan of Care  Patient       Patient will benefit from skilled therapeutic intervention in order to improve the following deficits and impairments:  Improper body mechanics, Decreased range of motion, Decreased activity tolerance, Decreased safety awareness, Decreased strength, Hypermobility, Increased fascial restricitons, Postural dysfunction, Pain, Decreased endurance, Decreased balance, Increased muscle spasms, Decreased mobility, Decreased coordination  Visit Diagnosis: Muscle weakness (generalized)  Other lack of coordination  Other symptoms and signs involving the musculoskeletal system     Problem List Patient Active Problem List   Diagnosis Date Noted  . Obstructive sleep apnea treated with continuous positive airway pressure (CPAP) 06/16/2017  . Perennial allergic rhinitis with a nonallergic component 01/31/2017  . Asthmatic bronchitis 01/31/2017  . GI symptoms 01/31/2017  . Chest pain 12/13/2012  . Dyspnea 12/13/2012    Jerl Mina 01/15/2018, 10:45 AM  [place] MAIN [facility] SERVICES [address] [place], [place], [address] Phone: [phone]   Fax:  [phone]  Name: Karen Neiswender  [name] MRN: [id number] Date of Birth: [birth year]

## 2018-01-16 DIAGNOSIS — E669 Obesity, unspecified: Secondary | ICD-10-CM | POA: Diagnosis not present

## 2018-01-22 ENCOUNTER — Ambulatory Visit: Payer: 59 | Admitting: Physical Therapy

## 2018-01-22 DIAGNOSIS — R29898 Other symptoms and signs involving the musculoskeletal system: Secondary | ICD-10-CM | POA: Diagnosis not present

## 2018-01-22 DIAGNOSIS — M25632 Stiffness of left wrist, not elsewhere classified: Secondary | ICD-10-CM | POA: Diagnosis not present

## 2018-01-22 DIAGNOSIS — M6281 Muscle weakness (generalized): Secondary | ICD-10-CM

## 2018-01-22 DIAGNOSIS — M25522 Pain in left elbow: Secondary | ICD-10-CM | POA: Diagnosis not present

## 2018-01-22 DIAGNOSIS — R278 Other lack of coordination: Secondary | ICD-10-CM | POA: Diagnosis not present

## 2018-01-22 DIAGNOSIS — M791 Myalgia, unspecified site: Secondary | ICD-10-CM | POA: Diagnosis not present

## 2018-01-22 NOTE — Patient Instructions (Signed)
  Stretching L abdomen area and posterior back/ ribs   Reclined twist for hips and side of the hips/ legs  Lay on your back, knees bend Scoot hips to thelR , leave shoulders in place Drop knees to the r side resting onto pillows to keep leg at the same width of hips Pillow under L thigh to minimize too much strain Pillow behind L back    Place L arm over head, resting on bed, to lengthen L flank    10 min of rest after work    One sided only for this week to address L sided restrictions.  Then do on both sides in 2nd week after today

## 2018-01-22 NOTE — Therapy (Addendum)
[place] MAIN [facility] SERVICES [address] [place], [place], [address] Phone: [phone]   Fax:  [phone]  Physical Therapy Treatment  Patient Details  Name: Karen [name] MRN: [id number] Date of Birth: 10/26/1970 Referring Provider (PT): Jacquiline Doe    Encounter Date: 01/22/2018  PT End of Session - 01/22/18 0857    Visit Number  7    Number of Visits  12    Date for PT Re-Evaluation  02/09/18    PT Start Time  0816    PT Stop Time  0855    PT Time Calculation (min)  39 min    Activity Tolerance  Patient tolerated treatment well    Behavior During Therapy  [facility] for tasks assessed/performed       Past Medical History:  Diagnosis Date  . Adenomyosis   . Arthritis 2013   L knee gets steroid injections   . Asthmatic bronchitis 01/31/2017  . DVT of lower extremity (deep venous thrombosis) ([place])   . Dysmenorrhea   . Endometriosis   . Fibroids   . GERD (gastroesophageal reflux disease)   . Kidney stones   . Menorrhagia   . Migraine   . Recurrent upper respiratory infection (URI)   . Sebaceous cyst of breast, right lower inner quadrant 10/25/2012   Excised 11/21/12   . Syncope, non cardiac     Past Surgical History:  Procedure Laterality Date  . ARTHROSCOPIC HAGLUNDS REPAIR    . BREAST CYST EXCISION Right 11/21/2012   Procedure: CYST EXCISION BREAST;  Surgeon: Haywood Lasso, MD;  Location: [place];  Service: General;  Laterality: Right;  . BREAST EXCISIONAL BIOPSY Right 11/2012  . CESAREAN SECTION    . CHOLECYSTECTOMY    . COLONOSCOPY  05/02/2011   Procedure: COLONOSCOPY;  Surgeon: Winfield Cunas., MD;  Location: [facility];  Service: Endoscopy;  Laterality: N/A;  . colonoscopy  11/04/2014  . ESOPHAGEAL MANOMETRY N/A 11/04/2015   Procedure: ESOPHAGEAL MANOMETRY (EM);  Surgeon: Laurence Spates, MD;  Location: [facility];  Service: Endoscopy;  Laterality: N/A;  . ESOPHAGOGASTRODUODENOSCOPY   05/02/2011   Procedure: ESOPHAGOGASTRODUODENOSCOPY (EGD);  Surgeon: Winfield Cunas., MD;  Location: [facility];  Service: Endoscopy;  Laterality: N/A;  . ESOPHAGOGASTRODUODENOSCOPY N/A 09/01/2014   Procedure: ESOPHAGOGASTRODUODENOSCOPY (EGD);  Surgeon: Laurence Spates, MD;  Location: [facility];  Service: Endoscopy;  Laterality: N/A;  . KNEE SURGERY    . LAPAROSCOPY    . laproscopic abdominal    . [place] IMPEDANCE STUDY N/A 11/04/2015   Procedure: [place] IMPEDANCE STUDY;  Surgeon: Laurence Spates, MD;  Location: [facility];  Service: Endoscopy;  Laterality: N/A;  . VAGINAL HYSTERECTOMY      There were no vitals filed for this visit.  Subjective Assessment - 01/22/18 0819    Subjective  Pt reports she has been resting after busy activties. Pt noticed cramping but it did not lead to diarrhea. Her stools continue to be well formed    Pertinent History  Upon discharge from Pelvic PT in April, pt had returned to [place], maintained pelvic exercises / relaxation fro one month after discharges.  Once she got a respiratory infection in May, she stopped doing all the PT exercises.  Pt was also trying to get use tot he CPAP Tx and took a trip during this time.  Pt had started OT to treat L elbow pain (April-May)  and took took steriods injections. Since Oct, pt started gettign back on  track with PT exericses included: body scan, deep core level 1-2, happy baby pose, V slides, pelvic squeezes, and has built back up to Zumba 3 x/ week for last week. Pt is feeling better but she feels she is struggling with keeping up with her plan to exercise, eat better, cook meals, sleep better but she gets interrupted by her diarrhea / constipation because impact her going to Zumba classes. Pt is looking to work with a nutritionist.       Patient Stated Goals  get to a normal bowel pattern, get back to lift, go out to eat with friends, travel without issues          [facility] PT Assessment - 01/22/18 0855      Palpation    Palpation comment  fascial restrictions on L medial aspect of iliac crest, posterior L flank/ thoracic/lumbar region ( decreased post Tx)                    [facility] Adult PT Treatment/Exercise - 01/22/18 0856      Moist Heat Therapy   Number Minutes Moist Heat  5 Minutes   positioned pt in supine reclined twist to increase mobility    Moist Heat Location  --   abdomen/ L posterior back ( unbilled)      Manual Therapy   Manual therapy comments  fascial releases with upper and lower trunk rotation  , and at areas noted in assessment                   PT Long Term Goals - 01/15/18 0908      PT LONG TERM GOAL #1   Title  Pt will decrease her ZUNG anxiety score from 50% to <30 % to demo IND with relaxation practices      Time  12    Period  Weeks    Status  On-going      PT LONG TERM GOAL #2   Title  Pt will decrease her COREFO score from 30% to < 15% in order to demo improve GI function and better bowel movements    Time  12    Period  Weeks    Status  On-going      PT LONG TERM GOAL #3   Title  Pt will demo improve less chest breathing and more diaphragmatic excursion to promote parasympathetic nervous system and immune system     Time  4    Period  Weeks    Status  Achieved      PT LONG TERM GOAL #4   Title  Pt will report making more time for self-care practices across 2 weeks and report less feeling of feeling overwhelmed in order to progress towards better digestion and sleep     Time  8    Period  Weeks    Status  Achieved      PT LONG TERM GOAL #5   Title  Pt will report no onset respiratory Sx and subsequent GI Sx with compliance to daily relaxation practices and self-care for 2 weeks.     Time  8    Period  Weeks    Status  On-going      PT LONG TERM GOAL #6   Title  Pt will report increased sleep hours from 4-5 hours to > 8hr for 3 nights per week in order to demo improved sleep quality     Time  8    Period  Weeks    Status  On-going       PT LONG TERM GOAL #7   Title  Pt will report improved stool consistency from Type 1 to Type 3-5 across 75% of the time to demo imprve consistency     Time  12    Period  Weeks    Status  Partially Met            Plan - 01/22/18 0858    Clinical Impression Statement  Pt continues to have improved stool consistency and less diarrhea episodes even when she feels cramping in her abdomen. This indicates the fascial releases and scar adhesion releases are helping with her motility. Continued to address L sided restrictions today anteriorly and posteriorly.  Pt demo'd improved increased mobility under L sternocostal angle with posterior intercostal mobility and less L abdominal scar restrictions. These improvements is leading to better diaphragmatic movement and in turn, more motility and a better balance between intraabdominal/  Intrapleural pressure system dynamics to help with her respiratory and digestive Sx. Pt continues to benefit from skilled PT.      Rehab Potential  Good    PT Frequency  1x / week    PT Duration  12 weeks    PT Treatment/Interventions  Therapeutic activities;Therapeutic exercise;Functional mobility training;Neuromuscular re-education;Manual techniques;Patient/family education;Moist Heat;Balance training;Stair training;Energy conservation;Taping;Aquatic Therapy;Biofeedback    Consulted and Agree with Plan of Care  Patient       Patient will benefit from skilled therapeutic intervention in order to improve the following deficits and impairments:  Improper body mechanics, Decreased range of motion, Decreased activity tolerance, Decreased safety awareness, Decreased strength, Hypermobility, Increased fascial restricitons, Postural dysfunction, Pain, Decreased endurance, Decreased balance, Increased muscle spasms, Decreased mobility, Decreased coordination  Visit Diagnosis: Muscle weakness (generalized)  Other symptoms and signs involving the musculoskeletal  system  Other lack of coordination     Problem List Patient Active Problem List   Diagnosis Date Noted  . Obstructive sleep apnea treated with continuous positive airway pressure (CPAP) 06/16/2017  . Perennial allergic rhinitis with a nonallergic component 01/31/2017  . Asthmatic bronchitis 01/31/2017  . GI symptoms 01/31/2017  . Chest pain 12/13/2012  . Dyspnea 12/13/2012    Jerl Mina 01/22/2018, 9:05 AM  [place] MAIN [facility] SERVICES [address] [place], [place], [address] Phone: [phone]   Fax:  [phone]  Name: Karen [name] MRN: [id number] Date of Birth: 10-Aug-1970

## 2018-01-29 ENCOUNTER — Ambulatory Visit: Payer: 59 | Admitting: Physical Therapy

## 2018-01-29 DIAGNOSIS — M25632 Stiffness of left wrist, not elsewhere classified: Secondary | ICD-10-CM | POA: Diagnosis not present

## 2018-01-29 DIAGNOSIS — M25522 Pain in left elbow: Secondary | ICD-10-CM

## 2018-01-29 DIAGNOSIS — R278 Other lack of coordination: Secondary | ICD-10-CM

## 2018-01-29 DIAGNOSIS — M791 Myalgia, unspecified site: Secondary | ICD-10-CM

## 2018-01-29 DIAGNOSIS — M6281 Muscle weakness (generalized): Secondary | ICD-10-CM

## 2018-01-29 DIAGNOSIS — R29898 Other symptoms and signs involving the musculoskeletal system: Secondary | ICD-10-CM

## 2018-01-31 NOTE — Therapy (Signed)
[place] MAIN [facility] SERVICES [address] [place], [place], [address] Phone: [phone]   Fax:  [phone]  Physical Therapy Treatment  Patient Details  Name: Karen [name] MRN: [id number] Date of Birth: 09-Jul-1970 Referring Provider (PT): Jacquiline Doe    Encounter Date: 01/29/2018  PT End of Session - 01/31/18 1022    Visit Number  8    Number of Visits  12    Date for PT Re-Evaluation  02/09/18    PT Start Time  0805    PT Stop Time  0900    PT Time Calculation (min)  55 min    Activity Tolerance  Patient tolerated treatment well    Behavior During Therapy  [facility] for tasks assessed/performed       Past Medical History:  Diagnosis Date  . Adenomyosis   . Arthritis 2013   L knee gets steroid injections   . Asthmatic bronchitis 01/31/2017  . DVT of lower extremity (deep venous thrombosis) ([place])   . Dysmenorrhea   . Endometriosis   . Fibroids   . GERD (gastroesophageal reflux disease)   . Kidney stones   . Menorrhagia   . Migraine   . Recurrent upper respiratory infection (URI)   . Sebaceous cyst of breast, right lower inner quadrant 10/25/2012   Excised 11/21/12   . Syncope, non cardiac     Past Surgical History:  Procedure Laterality Date  . ARTHROSCOPIC HAGLUNDS REPAIR    . BREAST CYST EXCISION Right 11/21/2012   Procedure: CYST EXCISION BREAST;  Surgeon: Haywood Lasso, MD;  Location: [place];  Service: General;  Laterality: Right;  . BREAST EXCISIONAL BIOPSY Right 11/2012  . CESAREAN SECTION    . CHOLECYSTECTOMY    . COLONOSCOPY  05/02/2011   Procedure: COLONOSCOPY;  Surgeon: Winfield Cunas., MD;  Location: [facility];  Service: Endoscopy;  Laterality: N/A;  . colonoscopy  11/04/2014  . ESOPHAGEAL MANOMETRY N/A 11/04/2015   Procedure: ESOPHAGEAL MANOMETRY (EM);  Surgeon: Laurence Spates, MD;  [facility]: [facility];  Service: Endoscopy;  Laterality: N/A;  . ESOPHAGOGASTRODUODENOSCOPY   05/02/2011   Procedure: ESOPHAGOGASTRODUODENOSCOPY (EGD);  Surgeon: Winfield Cunas., MD;  Location: [facility];  Service: Endoscopy;  Laterality: N/A;  . ESOPHAGOGASTRODUODENOSCOPY N/A 09/01/2014   Procedure: ESOPHAGOGASTRODUODENOSCOPY (EGD);  Surgeon: Laurence Spates, MD;  Location: [facility];  Service: Endoscopy;  Laterality: N/A;  . KNEE SURGERY    . LAPAROSCOPY    . laproscopic abdominal    . [place] IMPEDANCE STUDY N/A 11/04/2015   Procedure: [place] IMPEDANCE STUDY;  Surgeon: Laurence Spates, MD;  Location: [facility];  Service: Endoscopy;  Laterality: N/A;  . VAGINAL HYSTERECTOMY      There were no vitals filed for this visit.  Subjective Assessment - 01/31/18 1020    Subjective  Pt reports she had 2 episodes of bowel urge but she did not have diarrhea with these episodes. The stools were hard. Pt continues to see her nutritionists.  Pt continues to not have any respiratory problems    Pertinent History  Upon discharge from Pelvic PT in April, pt had returned to [place], maintained pelvic exercises / relaxation fro one month after discharges.  Once she got a respiratory infection in May, she stopped doing all the PT exercises.  Pt was also trying to get use tot he CPAP Tx and took a trip during this time.  Pt had started OT to treat L elbow pain (April-May)  and  took took steriods injections. Since Oct, pt started gettign back on track with PT exericses included: body scan, deep core level 1-2, happy baby pose, V slides, pelvic squeezes, and has built back up to Zumba 3 x/ week for last week. Pt is feeling better but she feels she is struggling with keeping up with her plan to exercise, eat better, cook meals, sleep better but she gets interrupted by her diarrhea / constipation because impact her going to Zumba classes. Pt is looking to work with a nutritionist.       Patient Stated Goals  get to a normal bowel pattern, get back to lift, go out to eat with friends, travel without issues           [facility] PT Assessment - 01/31/18 1020      Palpation   Palpation comment  fascial restrictions on upper quadrants / lower ribs , increased paraspinal L > R at thoracic level ( decreased post Tx)                    [facility] Adult PT Treatment/Exercise - 01/31/18 1020      Manual Therapy   Manual therapy comments  fascial releases over upper quadrant lower ribs with upper and lower trunk rotation,  paraspinal L > R at thoracic level                   PT Long Term Goals - 01/15/18 0908      PT LONG TERM GOAL #1   Title  Pt will decrease her ZUNG anxiety score from 50% to <30 % to demo IND with relaxation practices      Time  12    Period  Weeks    Status  On-going      PT LONG TERM GOAL #2   Title  Pt will decrease her COREFO score from 30% to < 15% in order to demo improve GI function and better bowel movements    Time  12    Period  Weeks    Status  On-going      PT LONG TERM GOAL #3   Title  Pt will demo improve less chest breathing and more diaphragmatic excursion to promote parasympathetic nervous system and immune system     Time  4    Period  Weeks    Status  Achieved      PT LONG TERM GOAL #4   Title  Pt will report making more time for self-care practices across 2 weeks and report less feeling of feeling overwhelmed in order to progress towards better digestion and sleep     Time  8    Period  Weeks    Status  Achieved      PT LONG TERM GOAL #5   Title  Pt will report no onset respiratory Sx and subsequent GI Sx with compliance to daily relaxation practices and self-care for 2 weeks.     Time  8    Period  Weeks    Status  On-going      PT LONG TERM GOAL #6   Title  Pt will report increased sleep hours from 4-5 hours to > 8hr for 3 nights per week in order to demo improved sleep quality     Time  8    Period  Weeks    Status  On-going      PT LONG TERM GOAL #7   Title  Pt will report  improved stool consistency from Type 1 to Type  3-5 across 75% of the time to demo imprve consistency     Time  12    Period  Weeks    Status  Partially Met            Plan - 01/31/18 1026    Clinical Impression Statement  _ Improvements:   Pt reports she had 2 episodes of bowel urge but she did not have diarrhea with these episodes. These improvements  signify the benefit of the manual treatments applied to decrease her scar adhesions from the multiple procedures she has had.     Current focus with PT Treatments:  Progressed further with manual Tx to release thoracic /upper abdominal fascial tightness today. Pt demo'd improved depression of thorax and lateral excursion of ribs which signify improved diaphragmatic mobility. Anticipate mobility of diaphragm will continue help her motility for better GI function and continue to better minimize respiratory problems.Pt continues to be compliant with her self-care practices for relaxation, improved sleep, and making time for Zumba classes. Pt continues to benefit from skilled PT and plan to reassess her goals at next visit as pt is close to d/c or being put on a once a month maintenance program to minimize risk for relapse.     Rehab Potential  Good    PT Frequency  1x / week    PT Duration  12 weeks    PT Treatment/Interventions  Therapeutic activities;Therapeutic exercise;Functional mobility training;Neuromuscular re-education;Manual techniques;Patient/family education;Moist Heat;Balance training;Stair training;Energy conservation;Taping;Aquatic Therapy;Biofeedback    Consulted and Agree with Plan of Care  Patient       Patient will benefit from skilled therapeutic intervention in order to improve the following deficits and impairments:  Improper body mechanics, Decreased range of motion, Decreased activity tolerance, Decreased safety awareness, Decreased strength, Hypermobility, Increased fascial restricitons, Postural dysfunction, Pain, Decreased endurance, Decreased balance, Increased  muscle spasms, Decreased mobility, Decreased coordination  Visit Diagnosis: Muscle weakness (generalized)  Other symptoms and signs involving the musculoskeletal system  Other lack of coordination  Pain in left elbow  Stiffness of left wrist, not elsewhere classified  Myalgia     Problem List Patient Active Problem List   Diagnosis Date Noted  . Obstructive sleep apnea treated with continuous positive airway pressure (CPAP) 06/16/2017  . Perennial allergic rhinitis with a nonallergic component 01/31/2017  . Asthmatic bronchitis 01/31/2017  . GI symptoms 01/31/2017  . Chest pain 12/13/2012  . Dyspnea 12/13/2012    Jerl Mina ,PT, DPT, E-RYT  01/31/2018, 10:26 AM  [place] MAIN [facility] SERVICES [address] [place], [place], [address] Phone: [phone]   Fax:  [phone]  Name: RIELYN KRUPINSKI MRN: [id number] Date of Birth: 09-01-1970

## 2018-02-05 ENCOUNTER — Ambulatory Visit: Payer: 59 | Attending: Gastroenterology | Admitting: Physical Therapy

## 2018-02-05 DIAGNOSIS — R278 Other lack of coordination: Secondary | ICD-10-CM | POA: Diagnosis not present

## 2018-02-05 DIAGNOSIS — M6281 Muscle weakness (generalized): Secondary | ICD-10-CM | POA: Insufficient documentation

## 2018-02-05 DIAGNOSIS — R29898 Other symptoms and signs involving the musculoskeletal system: Secondary | ICD-10-CM | POA: Insufficient documentation

## 2018-02-06 NOTE — Therapy (Addendum)
[place] MAIN [facility] SERVICES [address] [place], [place], [address] Phone: [phone]   Fax:  [phone]  Physical Therapy Treatment  Patient Details  Name: Karen [name] MRN: [id number] Date of Birth: [birth year] Referring Provider (PT): Jacquiline Doe    Encounter Date: 02/05/2018    Past Medical History:  Diagnosis Date  . Adenomyosis   . Arthritis 2013   L knee gets steroid injections   . Asthmatic bronchitis 01/31/2017  . DVT of lower extremity (deep venous thrombosis) ([place])   . Dysmenorrhea   . Endometriosis   . Fibroids   . GERD (gastroesophageal reflux disease)   . Kidney stones   . Menorrhagia   . Migraine   . Recurrent upper respiratory infection (URI)   . Sebaceous cyst of breast, right lower inner quadrant 10/25/2012   Excised 11/21/12   . Syncope, non cardiac     Past Surgical History:  Procedure Laterality Date  . ARTHROSCOPIC HAGLUNDS REPAIR    . BREAST CYST EXCISION Right 11/21/2012   Procedure: CYST EXCISION BREAST;  Surgeon: Haywood Lasso, MD;  Location: [place];  Service: General;  Laterality: Right;  . BREAST EXCISIONAL BIOPSY Right 11/2012  . CESAREAN SECTION    . CHOLECYSTECTOMY    . COLONOSCOPY  05/02/2011   Procedure: COLONOSCOPY;  Surgeon: Winfield Cunas., MD;  Location: [facility];  Service: Endoscopy;  Laterality: N/A;  . colonoscopy  11/04/2014  . ESOPHAGEAL MANOMETRY N/A 11/04/2015   Procedure: ESOPHAGEAL MANOMETRY (EM);  Surgeon: Laurence Spates, MD;  Location: [facility];  Service: Endoscopy;  Laterality: N/A;  . ESOPHAGOGASTRODUODENOSCOPY  05/02/2011   Procedure: ESOPHAGOGASTRODUODENOSCOPY (EGD);  Surgeon: Winfield Cunas., MD;  Location: [facility];  Service: Endoscopy;  Laterality: N/A;  . ESOPHAGOGASTRODUODENOSCOPY N/A 09/01/2014   Procedure: ESOPHAGOGASTRODUODENOSCOPY (EGD);  Surgeon: Laurence Spates, MD;  Location: [facility];  Service: Endoscopy;   Laterality: N/A;  . KNEE SURGERY    . LAPAROSCOPY    . laproscopic abdominal    . [place] IMPEDANCE STUDY N/A 11/04/2015   Procedure: [place] IMPEDANCE STUDY;  Surgeon: Laurence Spates, MD;  Location: [facility];  Service: Endoscopy;  Laterality: N/A;  . VAGINAL HYSTERECTOMY      There were no vitals filed for this visit.  Subjective Assessment - 02/08/18 0950    Subjective  Pt continues to have no more diarrhea episodes     Pertinent History  Upon discharge from Pelvic PT in April, pt had returned to [place], maintained pelvic exercises / relaxation fro one month after discharges.  Once she got a respiratory infection in May, she stopped doing all the PT exercises.  Pt was also trying to get use tot he CPAP Tx and took a trip during this time.  Pt had started OT to treat L elbow pain (April-May)  and took took steriods injections. Since Oct, pt started gettign back on track with PT exericses included: body scan, deep core level 1-2, happy baby pose, V slides, pelvic squeezes, and has built back up to Zumba 3 x/ week for last week. Pt is feeling better but she feels she is struggling with keeping up with her plan to exercise, eat better, cook meals, sleep better but she gets interrupted by her diarrhea / constipation because impact her going to Zumba classes. Pt is looking to work with a nutritionist.       Patient Stated Goals  get to a normal bowel pattern, get back to lift,  go out to eat with friends, travel without issues          [facility] PT Assessment - 02/08/18 0950      Observation/Other Assessments   Observations  noted increased lumbar lordosis in hooklying      Posture/Postural Control   Posture Comments  hyperextended knees in standing, lumbar lordosis       Palpation   Palpation comment  significant restrictions over lower robs, anterior/posterior .                     [place] Adult PT Treatment/Exercise - 02/08/18 0950      Neuro Re-ed    Neuro Re-ed Details   cued for less  lumbar lodosis, exhalation with moredepression of ribs to promote diaphragmatic pumping action       Manual Therapy   Manual therapy comments  fascial releases over lower ribs, anterior, sternocostal angle, posterior ribs                   PT Long Term Goals - 02/08/18 1002      PT LONG TERM GOAL #1   Title  Pt will decrease her ZUNG anxiety score from 50% to <30 % to demo IND with relaxation practices      Time  12    Period  Weeks    Status  On-going      PT LONG TERM GOAL #2   Title  Pt will decrease her COREFO score from 30% to < 15% in order to demo improve GI function and better bowel movements    Time  12    Period  Weeks    Status  On-going      PT LONG TERM GOAL #3   Title  Pt will demo improve less chest breathing and more diaphragmatic excursion to promote parasympathetic nervous system and immune system     Time  4    Period  Weeks    Status  Achieved      PT LONG TERM GOAL #4   Title  Pt will report making more time for self-care practices across 2 weeks and report less feeling of feeling overwhelmed in order to progress towards better digestion and sleep     Time  8    Period  Weeks    Status  Achieved      PT LONG TERM GOAL #5   Title  Pt will report no onset respiratory Sx and subsequent GI Sx with compliance to daily relaxation practices and self-care for 2 weeks.     Time  8    Period  Weeks    Status  Achieved      PT LONG TERM GOAL #6   Title  Pt will report increased sleep hours from 4-5 hours to > 8hr for 3 nights per week in order to demo improved sleep quality     Time  8    Period  Weeks    Status  Achieved      PT LONG TERM GOAL #7   Title  Pt will report improved stool consistency from Type 1 to Type 3-5 across 75% of the time to demo imprve consistency     Time  12    Period  Weeks    Status  Partially Met            Plan - 02/08/18 1003    Clinical Impression Statement  Pt has achieved 4/7 goals across the  past 9  visits. Pt 's major acheivements include: no having any diarrhea nor respiratory episodes across the past month. Pt has remained compliant with more self-care time such as resting on the weekend, increasing sleep hours from 4 hrs >8 hrs, not multitasking when eating, and utilizing calming breathing throughout the day to decrease stress.  Pt has benefitted greatly from manual treatments to release scar adhesions over the upper and lower quadrants of abdomen and fascial restrictions over lower ribs anterior/ posterior which helped to yield diagraphmatic mobility. Diaphragmatic function with less scar adhesions improved GI function and better vagus nn activation for a better regulated nervous system which also impacted sleep, stress management, and respiration. Pt voiced she was able to self-management at an initial onset of a respiratory episode and it did not resort to a flare up.  Pt remains compliant with working with a nutritionist at the [facility] to learn about healthier eating and cooking.  Pt continues to benefit from skilled PT. Plan to perform rectal assessment at upcoming visit as pt reports she is having harder stools.   Rehab Potential  Good    PT Frequency  1x / month    PT Duration  12 weeks    PT Treatment/Interventions  Therapeutic activities;Therapeutic exercise;Functional mobility training;Neuromuscular re-education;Manual techniques;Patient/family education;Moist Heat;Balance training;Stair training;Energy conservation;Taping;Aquatic Therapy;Biofeedback    Consulted and Agree with Plan of Care  Patient       Patient will benefit from skilled therapeutic intervention in order to improve the following deficits and impairments:  Improper body mechanics, Decreased range of motion, Decreased activity tolerance, Decreased safety awareness, Decreased strength, Hypermobility, Increased fascial restricitons, Postural dysfunction, Pain, Decreased endurance, Decreased balance,  Increased muscle spasms, Decreased mobility, Decreased coordination  Visit Diagnosis: Muscle weakness (generalized)  Other symptoms and signs involving the musculoskeletal system  Other lack of coordination     Problem List Patient Active Problem List   Diagnosis Date Noted  . Obstructive sleep apnea treated with continuous positive airway pressure (CPAP) 06/16/2017  . Perennial allergic rhinitis with a nonallergic component 01/31/2017  . Asthmatic bronchitis 01/31/2017  . GI symptoms 01/31/2017  . Chest pain 12/13/2012  . Dyspnea 12/13/2012    Jerl Mina ,PT, DPT, E-RYT  02/08/2018, 10:07 AM  [place] MAIN [facility] SERVICES [address] [place], [place], [address] Phone: [phone]   Fax:  [phone]  Name: Karen [name] MRN: [id number] Date of Birth: 1970-09-14

## 2018-02-08 NOTE — Addendum Note (Signed)
Addended by: Jerl Mina on: 02/08/2018 10:23 AM   Modules accepted: Orders

## 2018-02-09 MED FILL — RELISTOR 150 MG TABLET: 150 | 30 days supply | Qty: 60 | Fill #5

## 2018-02-14 DIAGNOSIS — R0789 Other chest pain: Secondary | ICD-10-CM | POA: Diagnosis not present

## 2018-02-14 DIAGNOSIS — K929 Disease of digestive system, unspecified: Secondary | ICD-10-CM | POA: Diagnosis not present

## 2018-02-14 DIAGNOSIS — E669 Obesity, unspecified: Secondary | ICD-10-CM | POA: Diagnosis not present

## 2018-02-14 DIAGNOSIS — D649 Anemia, unspecified: Secondary | ICD-10-CM | POA: Diagnosis not present

## 2018-02-20 MED FILL — COLCHICINE 0.6 MG TABS: 0.6 | 30 days supply | Qty: 60 | Fill #4

## 2018-02-22 ENCOUNTER — Ambulatory Visit: Payer: 59 | Admitting: Physical Therapy

## 2018-03-01 ENCOUNTER — Ambulatory Visit: Payer: 59 | Admitting: Physical Therapy

## 2018-03-01 DIAGNOSIS — R278 Other lack of coordination: Secondary | ICD-10-CM | POA: Diagnosis not present

## 2018-03-01 DIAGNOSIS — R29898 Other symptoms and signs involving the musculoskeletal system: Secondary | ICD-10-CM | POA: Diagnosis not present

## 2018-03-01 DIAGNOSIS — M6281 Muscle weakness (generalized): Secondary | ICD-10-CM

## 2018-03-01 NOTE — Therapy (Signed)
[place] MAIN [facility] SERVICES [address] [place], [place], [address] Phone: [phone]   Fax:  [phone]  Physical Therapy Treatment / Discharge Summary   Patient Details  Name: DREONNA HUSSEIN MRN: [id number] Date of Birth: 28-Jun-1970 Referring Provider (PT): Jacquiline Doe    Encounter Date: 03/01/2018  PT End of Session - 03/01/18 1402    Visit Number  10    Number of Visits  12    Date for PT Re-Evaluation  04/30/18    PT Start Time  3154    PT Stop Time  1400    PT Time Calculation (min)  55 min    Activity Tolerance  Patient tolerated treatment well    Behavior During Therapy  [facility] for tasks assessed/performed       Past Medical History:  Diagnosis Date  . Adenomyosis   . Arthritis 2013   L knee gets steroid injections   . Asthmatic bronchitis 01/31/2017  . DVT of lower extremity (deep venous thrombosis) ([place])   . Dysmenorrhea   . Endometriosis   . Fibroids   . GERD (gastroesophageal reflux disease)   . Kidney stones   . Menorrhagia   . Migraine   . Recurrent upper respiratory infection (URI)   . Sebaceous cyst of breast, right lower inner quadrant 10/25/2012   Excised 11/21/12   . Syncope, non cardiac     Past Surgical History:  Procedure Laterality Date  . ARTHROSCOPIC HAGLUNDS REPAIR    . BREAST CYST EXCISION Right 11/21/2012   Procedure: CYST EXCISION BREAST;  Surgeon: Haywood Lasso, MD;  Location: [place];  Service: General;  Laterality: Right;  . BREAST EXCISIONAL BIOPSY Right 11/2012  . CESAREAN SECTION    . CHOLECYSTECTOMY    . COLONOSCOPY  05/02/2011   Procedure: COLONOSCOPY;  Surgeon: Winfield Cunas., MD;  Location: [facility];  Service: Endoscopy;  Laterality: N/A;  . colonoscopy  11/04/2014  . ESOPHAGEAL MANOMETRY N/A 11/04/2015   Procedure: ESOPHAGEAL MANOMETRY (EM);  Surgeon: Laurence Spates, MD;  [facility]: [facility];  Service: Endoscopy;  Laterality: N/A;  .  ESOPHAGOGASTRODUODENOSCOPY  05/02/2011   Procedure: ESOPHAGOGASTRODUODENOSCOPY (EGD);  Surgeon: Winfield Cunas., MD;  Location: [facility];  Service: Endoscopy;  Laterality: N/A;  . ESOPHAGOGASTRODUODENOSCOPY N/A 09/01/2014   Procedure: ESOPHAGOGASTRODUODENOSCOPY (EGD);  Surgeon: Laurence Spates, MD;  Location: [facility];  Service: Endoscopy;  Laterality: N/A;  . KNEE SURGERY    . LAPAROSCOPY    . laproscopic abdominal    . [place] IMPEDANCE STUDY N/A 11/04/2015   Procedure: [place] IMPEDANCE STUDY;  Surgeon: Laurence Spates, MD;  [facility]: [facility];  Service: Endoscopy;  Laterality: N/A;  . VAGINAL HYSTERECTOMY      There were no vitals filed for this visit.  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 Pt will will undergoing surgery next Tues.  Pt noticed after the manual therappy on her back and abdominal scars, pt was able to go to the bathroom better. Stool consistency is still looser.     Pertinent History  Upon discharge from Pelvic PT in April, pt had returned to [place], maintained pelvic exercises / relaxation fro one month after discharges.  Once she got a respiratory infection in May, she stopped doing all the PT exercises.  Pt was also  trying to get use tot he CPAP Tx and took a trip during this time.  Pt had started OT to treat L elbow pain (April-May)  and took took steriods injections. Since Oct, pt started gettign back on track with PT exericses included: body scan, deep core level 1-2, happy baby pose, V slides, pelvic squeezes, and has built back up to Zumba 3 x/ week for last week. Pt is feeling better but she feels she is struggling with keeping up with her plan to exercise, eat better, cook meals, sleep better but she gets interrupted by her diarrhea / constipation because impact her going to Zumba classes. Pt is looking to work with a  nutritionist.       Patient Stated Goals  get to a normal bowel pattern, get back to lift, go out to eat with friends, travel without issues          [facility] PT Assessment - 03/01/18 1400      Palpation   Palpation comment  iliocostalis, paraspinals, lower trap attachment at mediodistal scap R, posterolateral intercostals R T11-12  mm tightness                   [facility] Adult PT Treatment/Exercise - 03/01/18 1358      Therapeutic Activites    Therapeutic Activities  --   discussed ROM movements in prep for jaw surgery next week     Exercises   Exercises  --   see pt isntructions, cued for alignment/ technique     Moist Heat Therapy   Number Minutes Moist Heat  5 Minutes    Moist Heat Location  --   R back      Manual Therapy   Manual therapy comments  STM/MWM iliocostalis, paraspinals, lower trap attachment at mediodistal scap R, posterolateral intercostals R T11-12                   PT Long Term Goals - 03/01/18 1309      PT LONG TERM GOAL #1   Title  Pt will decrease her ZUNG anxiety score from 50% to <30 % to demo IND with relaxation practices   ( 2/27: 36%)     Time  12    Period  Weeks    Status  Partially Met      PT LONG TERM GOAL #2   Title  Pt will decrease her COREFO score from 30% to < 15% in order to demo improve GI function and better bowel movements  ( 2/27: 21%)     Time  12    Period  Weeks    Status  Partially Met      PT LONG TERM GOAL #3   Title  Pt will demo improve less chest breathing and more diaphragmatic excursion to promote parasympathetic nervous system and immune system     Time  4    Period  Weeks    Status  Achieved      PT LONG TERM GOAL #4   Title  Pt will report making more time for self-care practices across 2 weeks and report less feeling of feeling overwhelmed in order to progress towards better digestion and sleep     Time  8    Period  Weeks    Status  Achieved      PT LONG TERM GOAL #5   Title  Pt  will report no onset respiratory Sx and subsequent GI Sx with compliance to daily  relaxation practices and self-care for 2 weeks.     Time  8    Period  Weeks    Status  Achieved      PT LONG TERM GOAL #6   Title  Pt will report increased sleep hours from 4-5 hours to > 8hr for 3 nights per week in order to demo improved sleep quality     Time  8    Period  Weeks    Status  Achieved      PT LONG TERM GOAL #7   Title  Pt will report improved stool consistency from Type 1 to Type 3-5 across 75% of the time to demo imprve consistency     Time  12    Period  Weeks    Status  Achieved            Plan - 03/01/18 1402    Clinical Impression Statement Pt achieved 5/7 goals and partially met 2/7 goals. Pt's ZUNG anxiety scale score decreased as she hs been compliant with relaxation / self-care practices which has facilitated better motility/ digestion/bowel elimination. Pt 's COREFO score decreased as well and pt's stool consistency also improved.  Pt demonstrated increased scar mobility over abdomen and decreased R thoracolumbar mm which has helped with increasing diaphragmatic mobility which has contributed to improved breathing, digestion, and pelvic function. Pt reports she has better management of her respiratory flare-ups, urinary incontinence, and less diarrhea. Pt worked with a Engineer, maintenance (IT). Today, manual Tx helped to decrease tightness along R thoracolumbar region which decreased postTx. Given pt's recent finding of tumor in her gums, it will be important to monitor this area and defer to MD for imaging if no changes occur from a musculoskeletal perspective. Pt will be undergoing surgery to remove tumor next week. Provided pre/post-surgery recommendations to maintain ROM in neck/ jaw per protocol of surgeon, utilize pain science/ mindfulness/ relaxation to optimize healing, prepare meals for better digestion if chewing/ movement of jaw is limited 2/2 pain/healing.   Pt is interested in  returning to PT to continue addressing R thoracolumbar tightness after her surgery.   Pt is ready for d/c at this time.       Rehab Potential  Good    PT Frequency  [place]    PT Duration  12 weeks    PT Treatment/Interventions  Therapeutic activities;Therapeutic exercise;Functional mobility training;Neuromuscular re-education;Manual techniques;Patient/family education;Moist Heat;Balance training;Stair training;Energy conservation;Taping;Aquatic Therapy;Biofeedback    Consulted and Agree with Plan of Care  Patient       Patient will benefit from skilled therapeutic intervention in order to improve the following deficits and impairments:  Improper body mechanics, Decreased range of motion, Decreased activity tolerance, Decreased safety awareness, Decreased strength, Hypermobility, Increased fascial restricitons, Postural dysfunction, Pain, Decreased endurance, Decreased balance, Increased muscle spasms, Decreased mobility, Decreased coordination  Visit Diagnosis: Muscle weakness (generalized)  Other symptoms and signs involving the musculoskeletal system  Other lack of coordination     Problem List Patient Active Problem List   Diagnosis Date Noted  . Obstructive sleep apnea treated with continuous positive airway pressure (CPAP) 06/16/2017  . Perennial allergic rhinitis with a nonallergic component 01/31/2017  . Asthmatic bronchitis 01/31/2017  . GI symptoms 01/31/2017  . Chest pain 12/13/2012  . Dyspnea 12/13/2012    Jerl Mina 03/01/2018, 3:00 PM  [place] MAIN [facility] SERVICES [address] [place], [place], [address] Phone: [phone]   Fax:  [phone]  Name: EASTON SIVERTSON MRN: [id number]  Date of Birth: 1970/08/19

## 2018-03-01 NOTE — Patient Instructions (Addendum)
Stretch the R low back area   _sheet over door with knot Standing 45deg away from sheet, mini squat, toes pointed toward sheet Pull with R arm, elbow bent towards rib L hand on R thigh for slight rotation of rib Look under R armpit    ___  In prep for jaw surgery and after  Gentle range of motion with jaw and neck  Meal prep for softer foods/ soups as you heal,   Follow surgeon's protocol    Restorative yoga and mindfulness./ body scan for relaxation to boost immune system and healing

## 2018-03-06 DIAGNOSIS — M278 Other specified diseases of jaws: Secondary | ICD-10-CM | POA: Diagnosis not present

## 2018-03-06 HISTORY — PX: DENTAL SURGERY: SHX609

## 2018-03-06 MED FILL — HYDROCODON-APAP 5-325: 5-325 | 2 days supply | Qty: 10 | Fill #0

## 2018-03-06 MED FILL — IBUPROFEN 600 MG TABLET: 600 | 4 days supply | Qty: 16 | Fill #0

## 2018-03-08 ENCOUNTER — Other Ambulatory Visit (HOSPITAL_COMMUNITY)
Admission: RE | Admit: 2018-03-08 | Discharge: 2018-03-08 | Disposition: A | Discharge status: Home / Self Care | Payer: 59 | Source: Ambulatory Visit | Attending: Gastroenterology | Admitting: Gastroenterology

## 2018-03-08 DIAGNOSIS — Z029 Encounter for administrative examinations, unspecified: Secondary | ICD-10-CM | POA: Diagnosis not present

## 2018-03-08 LAB — CBC WITH DIFFERENTIAL/PLATELET
Abs Immature Granulocytes: 0.01 10*3/uL (ref 0.00–0.07)
Basophils Absolute: 0 10*3/uL (ref 0.0–0.1)
Basophils Relative: 1 %
EOS PCT: 3 %
Eosinophils Absolute: 0.1 10*3/uL (ref 0.0–0.5)
HCT: 42 % (ref 36.0–46.0)
Hemoglobin: 13.4 g/dL (ref 12.0–15.0)
Immature Granulocytes: 0 %
Lymphocytes Relative: 38 %
Lymphs Abs: 1.4 10*3/uL (ref 0.7–4.0)
MCH: 32.7 pg (ref 26.0–34.0)
MCHC: 31.9 g/dL (ref 30.0–36.0)
MCV: 102.4 fL — AB (ref 80.0–100.0)
Monocytes Absolute: 0.4 10*3/uL (ref 0.1–1.0)
Monocytes Relative: 10 %
Neutro Abs: 1.8 10*3/uL (ref 1.7–7.7)
Neutrophils Relative %: 48 %
PLATELETS: 191 10*3/uL (ref 150–400)
RBC: 4.1 MIL/uL (ref 3.87–5.11)
RDW: 11.6 % (ref 11.5–15.5)
WBC: 3.8 10*3/uL — ABNORMAL LOW (ref 4.0–10.5)
nRBC: 0 % (ref 0.0–0.2)

## 2018-03-09 MED FILL — RELISTOR 150 MG TABLET: 150 | 30 days supply | Qty: 60 | Fill #6 | Status: TO

## 2018-03-19 MED FILL — COLCHICINE 0.6 MG TABS: 0.6 | 30 days supply | Qty: 60 | Fill #5 | Status: TO

## 2018-03-21 DIAGNOSIS — M278 Other specified diseases of jaws: Secondary | ICD-10-CM | POA: Diagnosis not present

## 2018-03-21 MED FILL — CYCLOBENZAPRINE 10 MG TAB: 10 | 4 days supply | Qty: 16 | Fill #0

## 2018-03-22 ENCOUNTER — Encounter: Payer: 59 | Admitting: Physical Therapy

## 2018-03-23 MED FILL — METHOCARBAMOL 500 MG TABS: 500 | 8 days supply | Qty: 60 | Fill #0

## 2018-03-23 MED FILL — FLUCONAZOLE 100 MG TABLET: 100 | 3 days supply | Qty: 3 | Fill #0

## 2018-03-23 MED FILL — AMOXICILLIN 875 MG TABS: 875 | 7 days supply | Qty: 14 | Fill #0

## 2018-03-31 MED FILL — RELISTOR 150 MG TABLET: 150 | 30 days supply | Qty: 60 | Fill #0

## 2018-04-04 MED FILL — FLUCONAZOLE 100 MG TAB: 100 | 7 days supply | Qty: 7 | Fill #0

## 2018-04-04 MED FILL — AMOXICILLIN 875 MG TABS: 875 | 7 days supply | Qty: 14 | Fill #0

## 2018-04-04 MED FILL — XIIDRA 5% EYE DROPS: 5 | 30 days supply | Qty: 60 | Fill #0

## 2018-04-06 DIAGNOSIS — Z713 Dietary counseling and surveillance: Secondary | ICD-10-CM | POA: Diagnosis not present

## 2018-04-06 DIAGNOSIS — Z00129 Encounter for routine child health examination without abnormal findings: Secondary | ICD-10-CM | POA: Diagnosis not present

## 2018-04-06 DIAGNOSIS — Z1389 Encounter for screening for other disorder: Secondary | ICD-10-CM | POA: Diagnosis not present

## 2018-04-06 DIAGNOSIS — M278 Other specified diseases of jaws: Secondary | ICD-10-CM | POA: Diagnosis not present

## 2018-04-06 MED FILL — COLCHICINE 0.6 MG TABS: 0.6 | 30 days supply | Qty: 60 | Fill #0

## 2018-04-06 MED FILL — KETOROLAC 10 MG TABLET: 10 | 4 days supply | Qty: 16 | Fill #0

## 2018-04-09 MED FILL — oxyCODONE HCL 5 MG TABS: 5 | 1 days supply | Qty: 6 | Fill #0

## 2018-04-13 MED FILL — predniSONE 5 MG (21) TBPK: 5 | 6 days supply | Qty: 21 | Fill #0

## 2018-04-17 MED FILL — FLUCONAZOLE 100 MG TAB: 100 | 10 days supply | Qty: 10 | Fill #0

## 2018-04-17 MED FILL — AMOXICILLIN 875 MG TABS: 875 | 10 days supply | Qty: 20 | Fill #0

## 2018-04-26 DIAGNOSIS — J029 Acute pharyngitis, unspecified: Secondary | ICD-10-CM | POA: Diagnosis not present

## 2018-04-26 DIAGNOSIS — K05311 Chronic periodontitis, localized, slight: Secondary | ICD-10-CM | POA: Diagnosis not present

## 2018-04-26 MED FILL — IBUPROFEN 600 MG TABLET: 600 | 5 days supply | Qty: 20 | Fill #0

## 2018-05-01 MED FILL — IBUPROFEN 600 MG TABLET: 600 | 7 days supply | Qty: 28 | Fill #0

## 2018-05-02 DIAGNOSIS — K5904 Chronic idiopathic constipation: Secondary | ICD-10-CM | POA: Diagnosis not present

## 2018-05-02 DIAGNOSIS — K929 Disease of digestive system, unspecified: Secondary | ICD-10-CM | POA: Diagnosis not present

## 2018-05-02 MED FILL — FAMOTIDINE 40 MG TABLET: 40 | 30 days supply | Qty: 30 | Fill #0

## 2018-05-03 MED FILL — RELISTOR 150 MG TABLET: 150 | 30 days supply | Qty: 60 | Fill #1 | Status: TO

## 2018-06-04 MED FILL — RELISTOR 150 MG TABLET: 150 | 30 days supply | Qty: 60 | Fill #0

## 2018-06-11 DIAGNOSIS — Z1239 Encounter for other screening for malignant neoplasm of breast: Secondary | ICD-10-CM | POA: Diagnosis not present

## 2018-06-11 DIAGNOSIS — J4520 Mild intermittent asthma, uncomplicated: Secondary | ICD-10-CM | POA: Diagnosis not present

## 2018-06-11 DIAGNOSIS — Z0000 Encounter for general adult medical examination without abnormal findings: Secondary | ICD-10-CM | POA: Diagnosis not present

## 2018-06-11 DIAGNOSIS — K219 Gastro-esophageal reflux disease without esophagitis: Secondary | ICD-10-CM | POA: Diagnosis not present

## 2018-06-11 DIAGNOSIS — Q799 Congenital malformation of musculoskeletal system, unspecified: Secondary | ICD-10-CM | POA: Diagnosis not present

## 2018-06-11 DIAGNOSIS — D165 Benign neoplasm of lower jaw bone: Secondary | ICD-10-CM | POA: Diagnosis not present

## 2018-07-03 NOTE — Therapy (Signed)
[place] MAIN [facility] SERVICES [address] [place], [place], [address] Phone: [phone]   Fax:  [phone]  Patient Details  Name: Karen [name] MRN: [id number] Date of Birth: 1970-05-21 Referring Provider:  No ref. provider found  Encounter Date: 06/28/2017   This chart was opened in error and was not seen on this date, note was run to close out chart from open chart list.      Lovett,Amy 07/03/2018, 8:50 AM  [place] [address] [place], [place], [address] Phone: [phone]   Fax:  [phone]

## 2018-07-04 MED FILL — RELISTOR 150 MG TABLET: 150 | 30 days supply | Qty: 60 | Fill #1

## 2018-08-02 MED FILL — RELISTOR 150 MG TABLET: 150 | 30 days supply | Qty: 60 | Fill #0

## 2018-08-31 MED FILL — RELISTOR 150 MG TABLET: 150 | 30 days supply | Qty: 60 | Fill #1

## 2018-10-03 MED FILL — RELISTOR 150 MG TABLET: 150 | 30 days supply | Qty: 60 | Fill #0

## 2018-10-10 DIAGNOSIS — R1310 Dysphagia, unspecified: Secondary | ICD-10-CM | POA: Diagnosis not present

## 2018-10-15 ENCOUNTER — Other Ambulatory Visit: Payer: Self-pay | Admitting: Internal Medicine

## 2018-10-15 DIAGNOSIS — Z1231 Encounter for screening mammogram for malignant neoplasm of breast: Secondary | ICD-10-CM

## 2018-10-17 MED FILL — FLUARIX QUADRIVALENT 0.5 ML: 0.5 | 1 days supply | Qty: 1 | Fill #0

## 2018-10-19 DIAGNOSIS — K5901 Slow transit constipation: Secondary | ICD-10-CM | POA: Diagnosis not present

## 2018-10-19 DIAGNOSIS — R109 Unspecified abdominal pain: Secondary | ICD-10-CM | POA: Diagnosis not present

## 2018-10-19 DIAGNOSIS — K219 Gastro-esophageal reflux disease without esophagitis: Secondary | ICD-10-CM | POA: Diagnosis not present

## 2018-10-19 DIAGNOSIS — R0789 Other chest pain: Secondary | ICD-10-CM | POA: Diagnosis not present

## 2018-10-19 MED FILL — NITROGLYCERIN 0.4 MG TAB SL: 0.4 | 10 days supply | Qty: 25 | Fill #0

## 2018-10-30 DIAGNOSIS — K219 Gastro-esophageal reflux disease without esophagitis: Secondary | ICD-10-CM | POA: Diagnosis not present

## 2018-10-30 DIAGNOSIS — M6289 Other specified disorders of muscle: Secondary | ICD-10-CM | POA: Diagnosis not present

## 2018-10-30 DIAGNOSIS — K582 Mixed irritable bowel syndrome: Secondary | ICD-10-CM | POA: Diagnosis not present

## 2018-10-30 DIAGNOSIS — K5900 Constipation, unspecified: Secondary | ICD-10-CM | POA: Diagnosis not present

## 2018-10-30 DIAGNOSIS — R1084 Generalized abdominal pain: Secondary | ICD-10-CM | POA: Diagnosis not present

## 2018-10-30 DIAGNOSIS — R1310 Dysphagia, unspecified: Secondary | ICD-10-CM | POA: Diagnosis not present

## 2018-10-31 MED FILL — RELISTOR 150 MG TABLET: 150 | 30 days supply | Qty: 60 | Fill #1

## 2018-11-13 DIAGNOSIS — K449 Diaphragmatic hernia without obstruction or gangrene: Secondary | ICD-10-CM | POA: Diagnosis not present

## 2018-11-13 DIAGNOSIS — K222 Esophageal obstruction: Secondary | ICD-10-CM | POA: Diagnosis not present

## 2018-11-13 DIAGNOSIS — Z79899 Other long term (current) drug therapy: Secondary | ICD-10-CM | POA: Diagnosis not present

## 2018-11-13 DIAGNOSIS — R0789 Other chest pain: Secondary | ICD-10-CM | POA: Diagnosis not present

## 2018-11-13 DIAGNOSIS — K228 Other specified diseases of esophagus: Secondary | ICD-10-CM | POA: Diagnosis not present

## 2018-11-13 DIAGNOSIS — K2100 Gastro-esophageal reflux disease with esophagitis, without bleeding: Secondary | ICD-10-CM | POA: Diagnosis not present

## 2018-11-13 DIAGNOSIS — K3189 Other diseases of stomach and duodenum: Secondary | ICD-10-CM | POA: Diagnosis not present

## 2018-11-16 DIAGNOSIS — Z01812 Encounter for preprocedural laboratory examination: Secondary | ICD-10-CM | POA: Diagnosis not present

## 2018-11-16 DIAGNOSIS — Z20828 Contact with and (suspected) exposure to other viral communicable diseases: Secondary | ICD-10-CM | POA: Diagnosis not present

## 2018-11-19 DIAGNOSIS — Z86718 Personal history of other venous thrombosis and embolism: Secondary | ICD-10-CM | POA: Diagnosis not present

## 2018-11-19 DIAGNOSIS — J45909 Unspecified asthma, uncomplicated: Secondary | ICD-10-CM | POA: Diagnosis not present

## 2018-11-19 DIAGNOSIS — K449 Diaphragmatic hernia without obstruction or gangrene: Secondary | ICD-10-CM | POA: Diagnosis not present

## 2018-11-19 DIAGNOSIS — R079 Chest pain, unspecified: Secondary | ICD-10-CM | POA: Diagnosis not present

## 2018-11-19 DIAGNOSIS — Z79899 Other long term (current) drug therapy: Secondary | ICD-10-CM | POA: Diagnosis not present

## 2018-11-28 ENCOUNTER — Other Ambulatory Visit: Payer: Self-pay

## 2018-11-28 ENCOUNTER — Ambulatory Visit
Admission: RE | Admit: 2018-11-28 | Discharge: 2018-11-28 | Disposition: A | Discharge status: Home / Self Care | Payer: 59 | Source: Ambulatory Visit | Attending: Internal Medicine | Admitting: Internal Medicine

## 2018-11-28 DIAGNOSIS — Z1231 Encounter for screening mammogram for malignant neoplasm of breast: Secondary | ICD-10-CM | POA: Diagnosis not present

## 2018-11-28 MED FILL — RELISTOR 150 MG TABLET: 150 | 30 days supply | Qty: 60 | Fill #2

## 2018-11-28 MED FILL — NITROGLYCERIN 0.4 MG TAB SL: 0.4 | 10 days supply | Qty: 25 | Fill #0

## 2018-12-14 DIAGNOSIS — H524 Presbyopia: Secondary | ICD-10-CM | POA: Diagnosis not present

## 2019-01-02 DIAGNOSIS — K219 Gastro-esophageal reflux disease without esophagitis: Secondary | ICD-10-CM | POA: Diagnosis not present

## 2019-01-02 DIAGNOSIS — R0789 Other chest pain: Secondary | ICD-10-CM | POA: Diagnosis not present

## 2019-01-02 MED FILL — SUCRALFATE 1 GM/10ML SUSP: 1 | 30 days supply | Qty: 1200 | Fill #0

## 2019-01-02 MED FILL — FAMOTIDINE 40 MG TABLET: 40 | 30 days supply | Qty: 60 | Fill #0

## 2019-01-03 MED FILL — RELISTOR 150 MG TABLET: 150 | 30 days supply | Qty: 60 | Fill #3

## 2019-01-07 DIAGNOSIS — S79911A Unspecified injury of right hip, initial encounter: Secondary | ICD-10-CM | POA: Diagnosis not present

## 2019-01-07 DIAGNOSIS — S3992XA Unspecified injury of lower back, initial encounter: Secondary | ICD-10-CM | POA: Diagnosis not present

## 2019-01-07 DIAGNOSIS — E669 Obesity, unspecified: Secondary | ICD-10-CM | POA: Diagnosis not present

## 2019-01-07 DIAGNOSIS — W19XXXA Unspecified fall, initial encounter: Secondary | ICD-10-CM | POA: Diagnosis not present

## 2019-01-07 DIAGNOSIS — S79912A Unspecified injury of left hip, initial encounter: Secondary | ICD-10-CM | POA: Diagnosis not present

## 2019-01-15 ENCOUNTER — Ambulatory Visit
Admission: RE | Admit: 2019-01-15 | Discharge: 2019-01-15 | Disposition: A | Discharge status: Home / Self Care | Payer: 59 | Source: Ambulatory Visit | Attending: Gastroenterology | Admitting: Gastroenterology

## 2019-01-15 ENCOUNTER — Other Ambulatory Visit: Payer: Self-pay | Admitting: Gastroenterology

## 2019-01-15 DIAGNOSIS — K5901 Slow transit constipation: Secondary | ICD-10-CM

## 2019-01-15 DIAGNOSIS — K5900 Constipation, unspecified: Secondary | ICD-10-CM | POA: Diagnosis not present

## 2019-02-02 IMAGING — CT CT ABD-PELV W/ CM
2 of 5 series · 16 of 46 positions shown, 18 images · IV contrast (APPLIED)
Comparison: MRI of the abdomen and pelvis performed 12/03/2015

CLINICAL DATA: Acute onset of upper abdominal cramping pain.
Diaphoresis and nausea. Initial encounter.

EXAM:
CT ABDOMEN AND PELVIS WITH CONTRAST
TECHNIQUE: Multidetector CT imaging of the abdomen and pelvis was performed
using the standard protocol following bolus administration of
intravenous contrast.
CONTRAST:  100mL YETFDE-GSS IOPAMIDOL (YETFDE-GSS) INJECTION 61%

[Series 3: abd/ pelvis 5.0 i30f 2 · axial · 0.92mm/px · z∈[+866,+1316]mm · 13 of 101 slices shown, 15 images]
[im 6/101  soft-tissue]
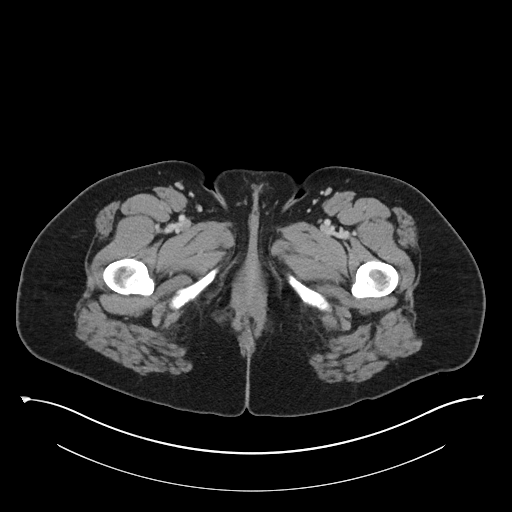
[im 6/101  bone]
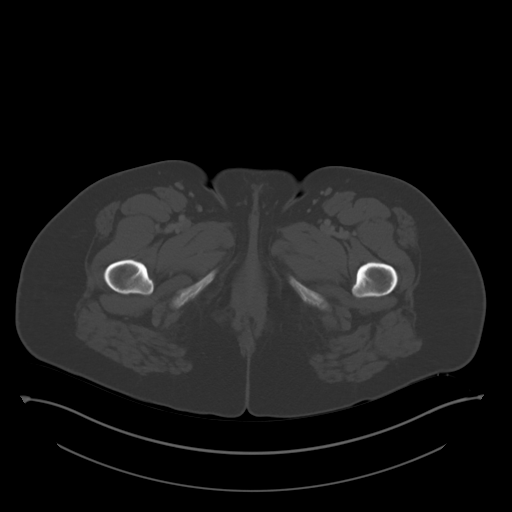
[im 16/101  soft-tissue]
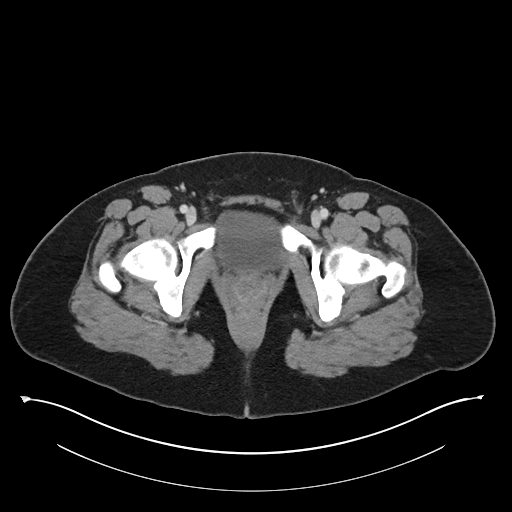
[im 21/101  soft-tissue]
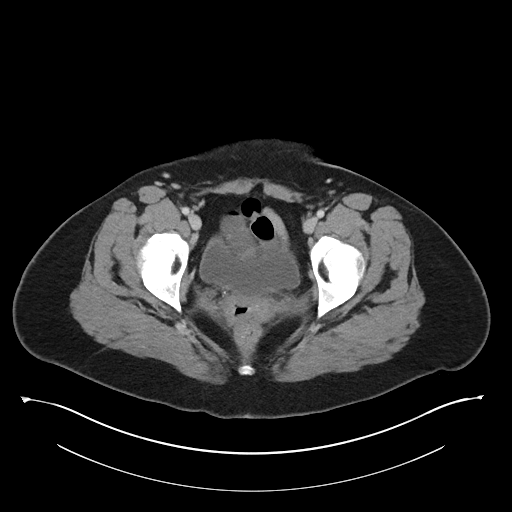
[im 31/101  soft-tissue]
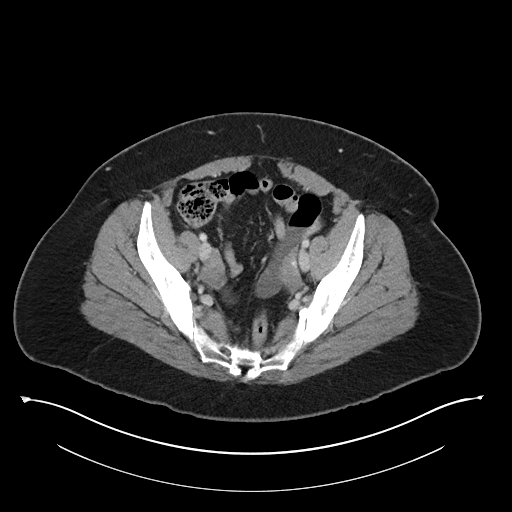
[im 36/101  soft-tissue]
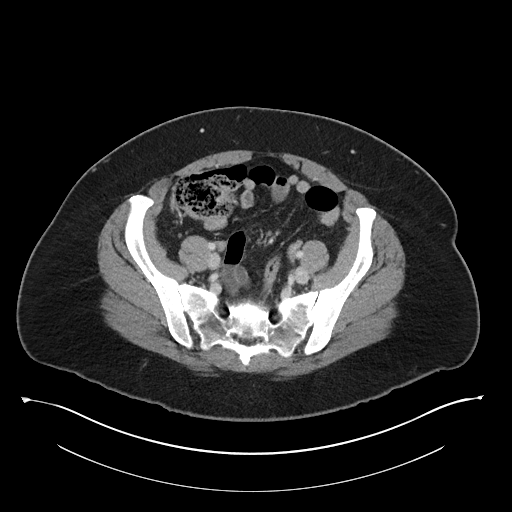
[im 46/101  soft-tissue]
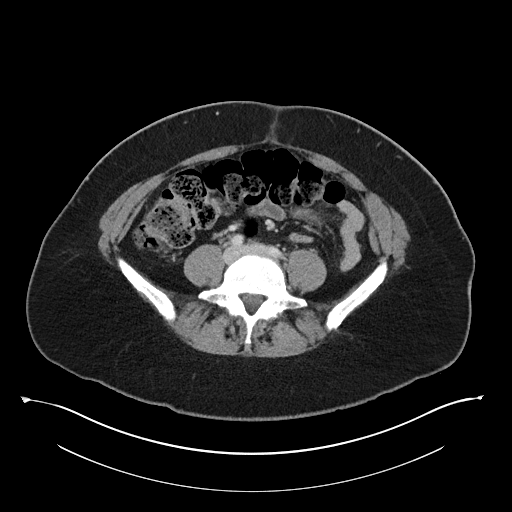
[im 51/101  soft-tissue]
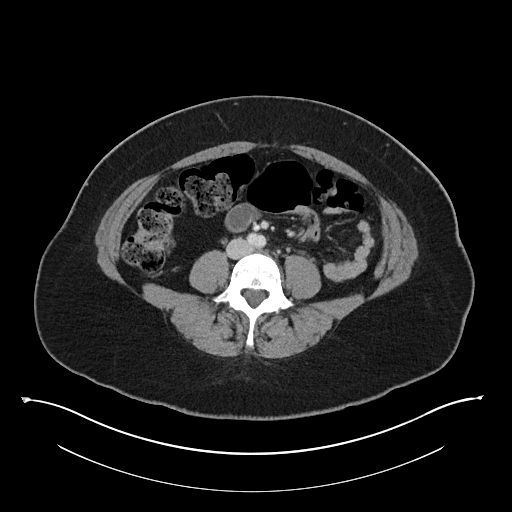
[im 56/101  soft-tissue]
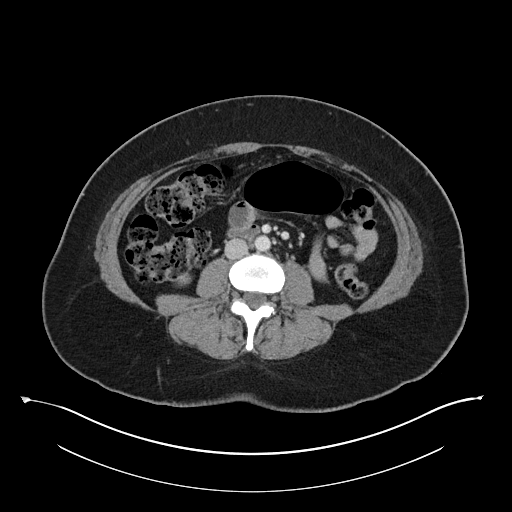
[im 66/101  soft-tissue]
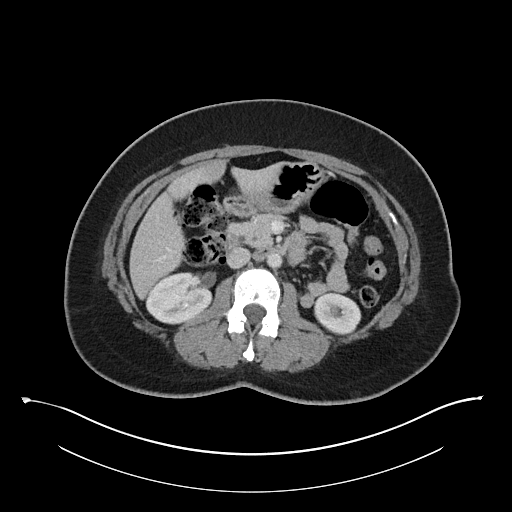
[im 66/101  bone]
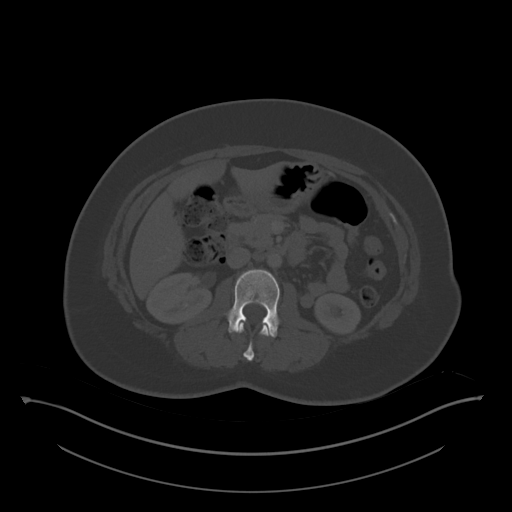
[im 71/101  soft-tissue]
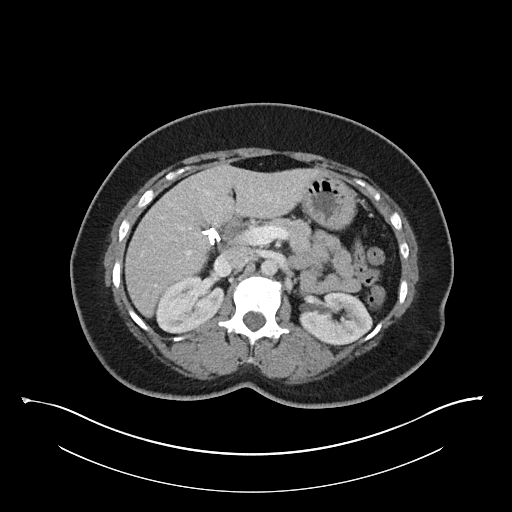
[im 81/101  soft-tissue]
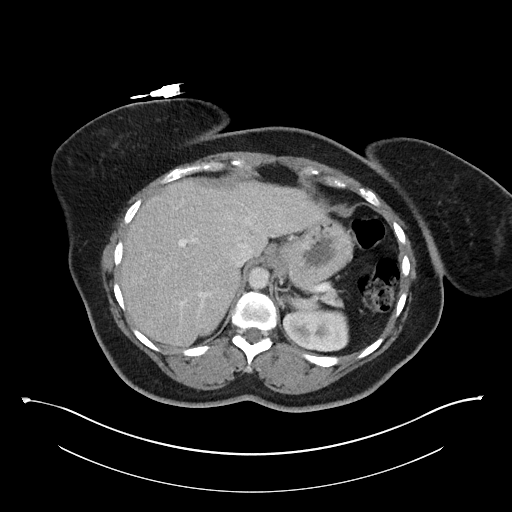
[im 86/101  soft-tissue]
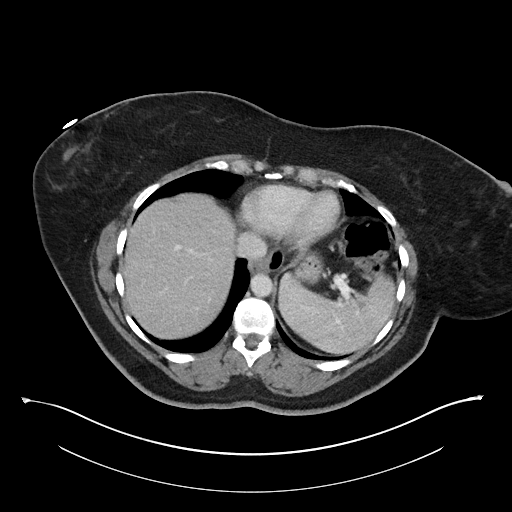
[im 96/101  soft-tissue]
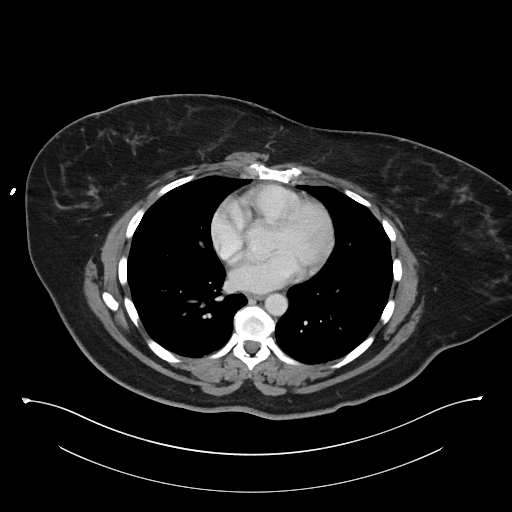

[Series 5: coronal soft tissue · coronal · 0.87mm/px · 3 of 102 slices shown]
[im 34/102  soft-tissue]
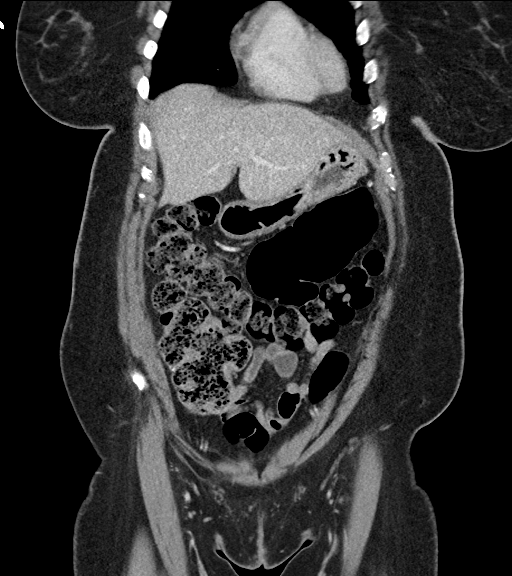
[im 45/102  soft-tissue]
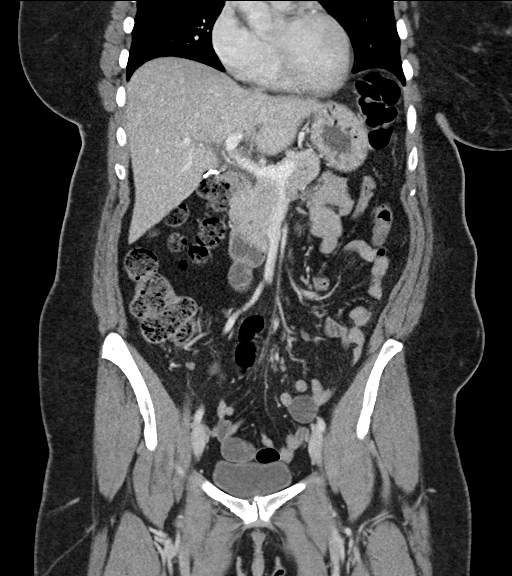
[im 57/102  soft-tissue]
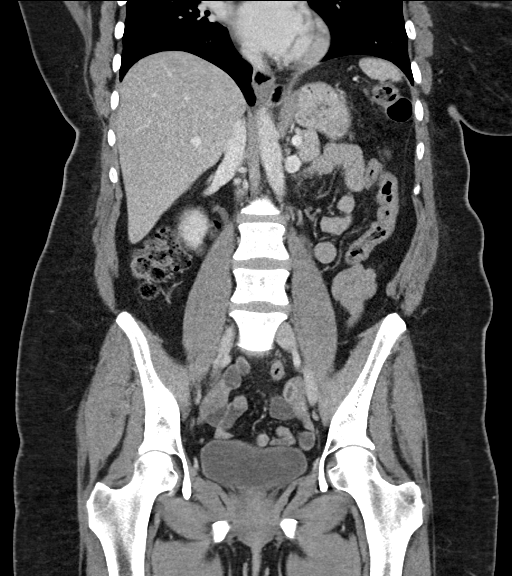

[16 of 46 positions shown; findings below may reference images not displayed]

FINDINGS: Lower chest: The visualized lung bases are grossly clear. The
visualized portions of the mediastinum are unremarkable. A tiny
hiatal hernia is noted.

Hepatobiliary: The liver is unremarkable in appearance. The patient
is status post cholecystectomy, with clips noted at the gallbladder
fossa. The common bile duct remains normal in caliber.

Pancreas: The pancreas is within normal limits.

Spleen: The spleen is unremarkable in appearance.

Adrenals/Urinary Tract: The adrenal glands are unremarkable in
appearance.

A 4 mm nonobstructing stone is noted at the interpole region of the
right kidney. There is no evidence of hydronephrosis. No obstructing
ureteral stones are identified. No perinephric stranding is seen.

Stomach/Bowel: The stomach is otherwise unremarkable in appearance.
The small bowel is within normal limits. The appendix is normal in
caliber, without evidence of appendicitis. The colon is unremarkable
in appearance.

Vascular/Lymphatic: The abdominal aorta is unremarkable in
appearance. The inferior vena cava is grossly unremarkable. No
retroperitoneal lymphadenopathy is seen. No pelvic sidewall
lymphadenopathy is identified.

Reproductive: The bladder is mildly distended and grossly
unremarkable. The patient is status post hysterectomy. No suspicious
adnexal masses are seen.

Other: No additional soft tissue abnormalities are seen.

Musculoskeletal: No acute osseous abnormalities are identified. The
visualized musculature is unremarkable in appearance.
IMPRESSION: 1. No acute abnormality seen to explain the patient's symptoms.
2. 4 mm nonobstructing stone at the interpole region of the right
kidney.
3. Tiny hiatal hernia noted.

## 2019-02-04 MED FILL — RELISTOR 150 MG TABLET: 150 | 30 days supply | Qty: 60 | Fill #4

## 2019-03-05 MED FILL — RELISTOR 150 MG TABLET: 150 | 30 days supply | Qty: 60 | Fill #5

## 2019-03-21 ENCOUNTER — Other Ambulatory Visit: Payer: Self-pay

## 2019-03-21 ENCOUNTER — Ambulatory Visit (INDEPENDENT_AMBULATORY_CARE_PROVIDER_SITE_OTHER): Payer: 59 | Admitting: Family Medicine

## 2019-03-21 ENCOUNTER — Encounter (INDEPENDENT_AMBULATORY_CARE_PROVIDER_SITE_OTHER): Payer: Self-pay | Admitting: Family Medicine

## 2019-03-21 VITALS — BP 110/75 | HR 91 | Temp 98.2°F | Ht 67.0 in | Wt 206.0 lb

## 2019-03-21 DIAGNOSIS — Z9189 Other specified personal risk factors, not elsewhere classified: Secondary | ICD-10-CM

## 2019-03-21 DIAGNOSIS — Z0289 Encounter for other administrative examinations: Secondary | ICD-10-CM

## 2019-03-21 DIAGNOSIS — G4733 Obstructive sleep apnea (adult) (pediatric): Secondary | ICD-10-CM

## 2019-03-21 DIAGNOSIS — K582 Mixed irritable bowel syndrome: Secondary | ICD-10-CM | POA: Diagnosis not present

## 2019-03-21 DIAGNOSIS — R0602 Shortness of breath: Secondary | ICD-10-CM | POA: Diagnosis not present

## 2019-03-21 DIAGNOSIS — R5383 Other fatigue: Secondary | ICD-10-CM

## 2019-03-21 DIAGNOSIS — E669 Obesity, unspecified: Secondary | ICD-10-CM | POA: Diagnosis not present

## 2019-03-21 DIAGNOSIS — D508 Other iron deficiency anemias: Secondary | ICD-10-CM | POA: Diagnosis not present

## 2019-03-21 DIAGNOSIS — Z6832 Body mass index (BMI) 32.0-32.9, adult: Secondary | ICD-10-CM

## 2019-03-21 DIAGNOSIS — Z1331 Encounter for screening for depression: Secondary | ICD-10-CM

## 2019-03-21 DIAGNOSIS — R739 Hyperglycemia, unspecified: Secondary | ICD-10-CM

## 2019-03-21 NOTE — Progress Notes (Signed)
Chief Complaint:   OBESITY Karen [name] (MR# [id number]) is a 49 y.o. female who presents for evaluation and treatment of obesity and related comorbidities. Current BMI is Body mass index is 32.26 kg/m. Karen [name] has been struggling with her weight for many years and has been unsuccessful in either losing weight, maintaining weight loss, or reaching her healthy weight goal.  Karen [name] is currently in the action stage of change and ready to dedicate time achieving and maintaining a healthier weight. Karen [name] is interested in becoming our patient and working on intensive lifestyle modifications including (but not limited to) diet and exercise for weight loss.  Karen [name]'s habits were reviewed today and are as follows: Her family eats meals together, she thinks her family will eat healthier with her, her desired weight loss is 26-46 lbs, she started gaining weight over the last 5 years, her heaviest weight ever was 230 pounds, she is a picky eater and doesn't like to eat healthier foods, she has significant food cravings issues, she skips meals frequently, she is frequently drinking liquids with calories, she frequently eats larger portions than normal and she struggles with emotional eating.  Depression Screen Karen [name]'s Food and Mood (modified PHQ-9) score was 2.  Depression screen Karen [name] 2/9 03/21/2019  Decreased Interest 0  Down, Depressed, Hopeless 0  PHQ - 2 Score 0  Altered sleeping 1  Tired, decreased energy 0  Change in appetite 1  Feeling bad or failure about yourself  0  Trouble concentrating 0  Moving slowly or fidgety/restless 0  Suicidal thoughts 0  PHQ-9 Score 2  Difficult doing work/chores Not difficult at all   Subjective:   1. Other fatigue Karen [name] admits to daytime somnolence and admits to waking up still tired. Patent has a history of symptoms of daytime fatigue. Karen [name] generally gets 5 or 6 hours of sleep per night, and states that she has generally restful sleep.  Snoring is present. Apneic episodes are present. Epworth Sleepiness Score is 9.  2. Shortness of breath on exertion Karen [name] notes increasing shortness of breath with exercising and seems to be worsening over time with weight gain. She notes getting out of breath sooner with activity than she used to. This has not gotten worse recently. Karen [name] denies shortness of breath at rest or orthopnea.  3. OSA (obstructive sleep apnea) Karen [name] does not tolerate her CPAP. She estimates that she gets 5-6 hours of sleep per night. She is experiencing daytime fatigue. She denies morning headaches.  4. Other iron deficiency anemia Karen [name] has a history of anemia.  5. Irritable bowel syndrome with both constipation and diarrhea Karen [name] is on Relistor 5 mg 2 tablets prior to breakfast. She is closely followed by local GI and [facility]. Her next colonoscopy is due in 2021. Her mother had colon cancer, ultimately ended up on TPN>7 years.  6. Hyperglycemia Karen [name] has a history of elevated BGs in Epic.  7. At risk for malnutrition Karen [name] is at increased risk for malnutrition due to mixed IBS with multiple food intolerances.  Assessment/Plan:   1. Other fatigue Karen [name] does feel that her weight is causing her energy to be lower than it should be. Fatigue may be related to obesity, depression or many other causes. Labs will be ordered, and in the meanwhile, Karen [name] will focus on self care including making healthy food choices, increasing physical activity and focusing on stress reduction.  - EKG 12-Lead - Vitamin B12 - T3 - T4, free - TSH - Folate -  VITAMIN D 25 Hydroxy (Vit-D Deficiency, Fractures) - Lipid Panel With LDL/HDL Ratio  2. Shortness of breath on exertion Karen [name] does feel that she gets out of breath more easily that she used to when she exercises. Karen [name]'s shortness of breath appears to be obesity related and exercise induced. She has agreed to work on weight loss and gradually increase  exercise to treat her exercise induced shortness of breath. Will continue to monitor closely.  3. OSA (obstructive sleep apnea) Intensive lifestyle modifications are the first line treatment for this issue. We discussed several lifestyle modifications today and Karen [name] will begin her journaling meal plan, and will continue to work on exercise and weight loss efforts. We will continue to monitor. Orders and follow up as documented in patient record.   4. Other iron deficiency anemia We will check labs today. Karen [name] will begin her journaling meal plan. Orders and follow up as documented in patient record.  - CBC with Differential/Platelet  5. Irritable bowel syndrome with both constipation and diarrhea We will check labs today. Karen [name] will continue to follow up closely with GI team. I recommended 25 grams of fiber daily, preferable natural sources versus supplementations.  6. Hyperglycemia Fasting labs will be obtained today and results with be discussed with Karen [name] in 2 weeks at her follow up visit. In the meanwhile Karen [name] will begin her journaling meal plan and will work on weight loss efforts.  - Comprehensive metabolic panel - Hemoglobin A1c - Insulin, random  7. Depression screening Karen [name] had a negative depression screening. Depression is commonly associated with obesity and often results in emotional eating behaviors. We will monitor this closely and work on CBT to help improve the non-hunger eating patterns. Referral to Psychology may be required if no improvement is seen as she continues in our clinic.  8. At risk for malnutrition Karen [name] was given approximately 30 minutes of counseling today regarding prevention of malnutrition. Karen [name] was advised that having bariatric surgery increases her risk for anemia, malnutrition, and vitamin deficiencies.   9. Class 1 obesity with serious comorbidity and body mass index (BMI) of 32.0 to 32.9 in adult, unspecified obesity type Karen [name]  is currently in the action stage of change and her goal is to continue with weight loss efforts. I recommend Karen [name] begin the structured treatment plan as follows:  She has agreed to keeping a food journal and adhering to recommended goals of 1300-1600 calories and 85+ grams of protein daily.  Exercise goals: No exercise has been prescribed for now, while we concentrate on nutritional changes.   Behavioral modification strategies: increasing lean protein intake, increasing high fiber foods and keeping a strict food journal.  She was informed of the importance of frequent follow-up visits to maximize her success with intensive lifestyle modifications for her multiple health conditions. She was informed we would discuss her lab results at her next visit unless there is a critical issue that needs to be addressed sooner. Karen [name] agreed to keep her next visit at the agreed upon time to discuss these results.  Objective:   Blood pressure 110/75, pulse 91, temperature 98.2 F (36.8 C), temperature source Oral, height 5\' 7"  (1.702 m), weight 206 lb (93.4 kg), SpO2 97 %. Body mass index is 32.26 kg/m.  EKG: Normal sinus rhythm, rate 88 BPM.  Indirect Calorimeter completed today shows a VO2 of 280 and a REE of 1952.  Her calculated basal metabolic rate is [id number] thus her basal metabolic rate is better than expected.  General: Cooperative,  alert, well developed, in no acute distress. HEENT: Conjunctivae and lids unremarkable. Cardiovascular: Regular rhythm.  Lungs: Normal work of breathing. Neurologic: No focal deficits.   Lab Results  Component Value Date   CREATININE 0.72 05/25/2016   BUN 9 05/25/2016   NA 138 05/25/2016   K 3.8 05/25/2016   CL 105 05/25/2016   CO2 24 05/25/2016   Lab Results  Component Value Date   ALT 19 05/25/2016   AST 27 05/25/2016   ALKPHOS 76 05/25/2016   BILITOT 1.2 05/25/2016   No results found for: HGBA1C No results found for: INSULIN No results found  for: TSH No results found for: CHOL, HDL, LDLCALC, LDLDIRECT, TRIG, CHOLHDL Lab Results  Component Value Date   WBC 3.8 (L) 03/08/2018   HGB 13.4 03/08/2018   HCT 42.0 03/08/2018   MCV 102.4 (H) 03/08/2018   PLT 191 03/08/2018   No results found for: IRON, TIBC, FERRITIN  Attestation Statements:   Reviewed by clinician on day of visit: allergies, medications, problem list, medical history, surgical history, family history, social history, and previous encounter notes.   I, Trixie Dredge, am acting as transcriptionist for Dennard Nip, MD.  I have reviewed the above documentation for accuracy and completeness, and I agree with the above. - Dennard Nip, MD

## 2019-03-22 LAB — COMPREHENSIVE METABOLIC PANEL
ALT: 17 IU/L (ref 0–32)
AST: 16 IU/L (ref 0–40)
Albumin/Globulin Ratio: 1.4 (ref 1.2–2.2)
Albumin: 4.2 g/dL (ref 3.8–4.8)
Alkaline Phosphatase: 91 IU/L (ref 39–117)
BUN/Creatinine Ratio: 13 (ref 9–23)
BUN: 9 mg/dL (ref 6–24)
Bilirubin Total: 0.5 mg/dL (ref 0.0–1.2)
CO2: 20 mmol/L (ref 20–29)
Calcium: 9.4 mg/dL (ref 8.7–10.2)
Chloride: 104 mmol/L (ref 96–106)
Creatinine, Ser: 0.67 mg/dL (ref 0.57–1.00)
GFR calc Af Amer: 119 mL/min/{1.73_m2} (ref >59)
GFR calc non Af Amer: 103 mL/min/{1.73_m2} (ref >59)
Globulin, Total: 3 g/dL (ref 1.5–4.5)
Glucose: 90 mg/dL (ref 65–99)
Potassium: 4 mmol/L (ref 3.5–5.2)
Sodium: 140 mmol/L (ref 134–144)
Total Protein: 7.2 g/dL (ref 6.0–8.5)

## 2019-03-22 LAB — LIPID PANEL WITH LDL/HDL RATIO
Cholesterol, Total: 144 mg/dL (ref 100–199)
HDL: 44 mg/dL (ref >39)
LDL Chol Calc (NIH): 84 mg/dL (ref 0–99)
LDL/HDL Ratio: 1.9 ratio (ref 0.0–3.2)
Triglycerides: 82 mg/dL (ref 0–149)
VLDL Cholesterol Cal: 16 mg/dL (ref 5–40)

## 2019-03-22 LAB — T4, FREE: Free T4: 1.08 ng/dL (ref 0.82–1.77)

## 2019-03-22 LAB — CBC WITH DIFFERENTIAL/PLATELET
Basophils Absolute: 0 10*3/uL (ref 0.0–0.2)
Basos: 1 %
EOS (ABSOLUTE): 0.1 10*3/uL (ref 0.0–0.4)
Eos: 2 %
Hematocrit: 39.4 % (ref 34.0–46.6)
Hemoglobin: 13.4 g/dL (ref 11.1–15.9)
Immature Grans (Abs): 0 10*3/uL (ref 0.0–0.1)
Immature Granulocytes: 0 %
Lymphocytes Absolute: 1.2 10*3/uL (ref 0.7–3.1)
Lymphs: 22 %
MCH: 33.8 pg — ABNORMAL HIGH (ref 26.6–33.0)
MCHC: 34 g/dL (ref 31.5–35.7)
MCV: 100 fL — ABNORMAL HIGH (ref 79–97)
Monocytes Absolute: 0.5 10*3/uL (ref 0.1–0.9)
Monocytes: 9 %
Neutrophils Absolute: 3.8 10*3/uL (ref 1.4–7.0)
Neutrophils: 66 %
Platelets: 228 10*3/uL (ref 150–450)
RBC: 3.96 x10E6/uL (ref 3.77–5.28)
RDW: 11 % — ABNORMAL LOW (ref 11.7–15.4)
WBC: 5.6 10*3/uL (ref 3.4–10.8)

## 2019-03-22 LAB — VITAMIN B12: Vitamin B-12: 1401 pg/mL — ABNORMAL HIGH (ref 232–1245)

## 2019-03-22 LAB — VITAMIN D 25 HYDROXY (VIT D DEFICIENCY, FRACTURES): Vit D, 25-Hydroxy: 26.1 ng/mL — ABNORMAL LOW (ref 30.0–100.0)

## 2019-03-22 LAB — HEMOGLOBIN A1C
Est. average glucose Bld gHb Est-mCnc: 94 mg/dL
Hgb A1c MFr Bld: 4.9 % (ref 4.8–5.6)

## 2019-03-22 LAB — INSULIN, RANDOM: INSULIN: 10.9 u[IU]/mL (ref 2.6–24.9)

## 2019-03-22 LAB — FOLATE: Folate: 11.7 ng/mL (ref >3.0)

## 2019-03-22 LAB — TSH: TSH: 0.788 u[IU]/mL (ref 0.450–4.500)

## 2019-03-22 LAB — T3: T3, Total: 126 ng/dL (ref 71–180)

## 2019-03-25 ENCOUNTER — Ambulatory Visit: Payer: Self-pay | Admitting: Obstetrics and Gynecology

## 2019-03-27 DIAGNOSIS — R0789 Other chest pain: Secondary | ICD-10-CM | POA: Diagnosis not present

## 2019-03-27 DIAGNOSIS — K929 Disease of digestive system, unspecified: Secondary | ICD-10-CM | POA: Diagnosis not present

## 2019-03-27 DIAGNOSIS — K5904 Chronic idiopathic constipation: Secondary | ICD-10-CM | POA: Diagnosis not present

## 2019-03-27 DIAGNOSIS — K219 Gastro-esophageal reflux disease without esophagitis: Secondary | ICD-10-CM | POA: Diagnosis not present

## 2019-03-27 DIAGNOSIS — R197 Diarrhea, unspecified: Secondary | ICD-10-CM | POA: Diagnosis not present

## 2019-03-27 DIAGNOSIS — K449 Diaphragmatic hernia without obstruction or gangrene: Secondary | ICD-10-CM | POA: Diagnosis not present

## 2019-03-27 DIAGNOSIS — R109 Unspecified abdominal pain: Secondary | ICD-10-CM | POA: Diagnosis not present

## 2019-03-27 DIAGNOSIS — K5901 Slow transit constipation: Secondary | ICD-10-CM | POA: Diagnosis not present

## 2019-03-28 ENCOUNTER — Other Ambulatory Visit: Payer: Self-pay

## 2019-03-28 ENCOUNTER — Encounter: Payer: Self-pay | Admitting: Dietician

## 2019-03-28 ENCOUNTER — Encounter: Payer: 59 | Attending: Internal Medicine | Admitting: Dietician

## 2019-03-28 DIAGNOSIS — Z713 Dietary counseling and surveillance: Secondary | ICD-10-CM | POA: Insufficient documentation

## 2019-03-28 NOTE — Progress Notes (Signed)
Karen [name] Employee Wellness Visit  Visit 1 of 3 EID# [id number]   Primary concerns today: IBS  Preferred learning style: no preference indicated Learning readiness: ready   NUTRITION ASSESSMENT   Clinical Medical Hx: asthma, constipation, diarrhea, esophageal stricture, cholecystectomy, GERD, hiatal hernia, IBS, kidney stones, lactose intolerance, sleep apnea  Notable Signs/Symptoms: diarrhea, constipation   Lifestyle & Dietary Hx Works at [employer] for [employer].  Hx of GI issues, has seen gastroenterologists and worked with other providers. States she has IBS with diarrhea and constipation. Been taking probiotics for a while and has switched occasionally.  Will cook a lot and make most foods/beverages homemade. If eats out may have the following:  Longhorn salmon with sweet potato and creamed spinach (half of the meal) with sweet tea. [employer] grilled nuggets (w/o marinade) with waffle fries and sweet tea. [employer] breakfast burrito bowl with grilled chicken. Japanese hibachi steak and shrimp with rice and cooked zucchini (half of the meal.) Mayflower fried trout with hush puppies, a baked potato, and coleslaw. [employer] burger with ketchup and mustard and fries. [employer] Daddy's burger with tater tots (half of the meal.)   Recently started incorporating snacks in order to eat 4-5 times per day (vs. 2-3 times per day.) [employer], tomato-based pizza sauce, salad greens, and relish do not sit well. Will often snack on fruit.    Estimated daily fluid intake: 15-20 cups water, 24-48 oz sweet tea  Supplements: ultra-flora probiotic  Sleep: 4-5 hours/night (has sleep apnea as well)   24-Hr Dietary Recall First Meal: 8am: Premier Protein shake + 1/2 cup applesauce + 1 Tbsp flaxseed + 10 oz prune juice + 24 oz English breakfast tea + 6 tsp sugar  Snack: Silk almond milk yogurt  Second Meal: 6-8 oz lean ground sirloin + 1/2 cup rice + 1/2 cup cooked kale  Snack: fruit cup    Third Meal: taco shell bowl +  taco meat + 2% mexican cheese + cooked kale + applesauce  Snack: - Beverages: water, sweet tea, Hint water, almond milk  Estimated Energy Needs Calories: 1600 Carbohydrate: 180g Protein: 100g Fat: 53g    NUTRITION EDUCATION & GOALS  . General healthful nutrition- breakdown of carbohydrates, protein, and fat  Learning Style & Readiness for Change Teaching method utilized: Visual & Auditory  Demonstrated degree of understanding via: Teach Back  Barriers to learning/adherence to lifestyle change: None Identified   Goals Established by Employee . Aim to eat small frequent meals/snacks throughout the day.  . Continue to drink lots of water and limit sugar-sweetened beverages as much as possible.     MONITORING & EVALUATION Dietary intake, weekly physical activity, and goals in 1 month.  Next Steps  Employee is to return to [facility] in 1 month for 2nd wellness visit.

## 2019-03-29 MED FILL — RELISTOR 150 MG TABLET: 150 | 30 days supply | Qty: 60 | Fill #0

## 2019-04-02 MED FILL — RELISTOR 150 MG TABLET: 150 | 30 days supply | Qty: 60 | Fill #0

## 2019-04-04 ENCOUNTER — Encounter (INDEPENDENT_AMBULATORY_CARE_PROVIDER_SITE_OTHER): Payer: Self-pay | Admitting: Family Medicine

## 2019-04-04 ENCOUNTER — Other Ambulatory Visit: Payer: Self-pay

## 2019-04-04 ENCOUNTER — Ambulatory Visit (INDEPENDENT_AMBULATORY_CARE_PROVIDER_SITE_OTHER): Payer: 59 | Admitting: Family Medicine

## 2019-04-04 VITALS — BP 113/71 | HR 71 | Temp 97.9°F | Ht 67.0 in | Wt 202.0 lb

## 2019-04-04 DIAGNOSIS — Z9189 Other specified personal risk factors, not elsewhere classified: Secondary | ICD-10-CM | POA: Diagnosis not present

## 2019-04-04 DIAGNOSIS — R748 Abnormal levels of other serum enzymes: Secondary | ICD-10-CM

## 2019-04-04 DIAGNOSIS — E669 Obesity, unspecified: Secondary | ICD-10-CM | POA: Diagnosis not present

## 2019-04-04 DIAGNOSIS — Z6831 Body mass index (BMI) 31.0-31.9, adult: Secondary | ICD-10-CM | POA: Diagnosis not present

## 2019-04-04 DIAGNOSIS — E559 Vitamin D deficiency, unspecified: Secondary | ICD-10-CM

## 2019-04-04 DIAGNOSIS — E8881 Metabolic syndrome: Secondary | ICD-10-CM | POA: Diagnosis not present

## 2019-04-04 MED ORDER — VITAMIN D (ERGOCALCIFEROL) 1.25 MG (50000 UNIT) PO CAPS: 50000.0000 [IU] | ORAL_CAPSULE | ORAL | 0 refills | Status: DC

## 2019-04-04 MED FILL — VIT D2 1.25 MG (50,000 UNIT: 1.25 MG | 28 days supply | Qty: 4 | Fill #0

## 2019-04-04 NOTE — Progress Notes (Signed)
Chief Complaint:   OBESITY Leany is here to discuss her progress with her obesity treatment plan along with follow-up of her obesity related diagnoses. Norma is on keeping a food journal and adhering to recommended goals of 1300-1600 calories and 85+ grams of protein daily and states she is following her eating plan approximately 50% of the time. Megham states she is walking for 30 minutes 5-6 times per week.  Today's visit was #: 2 Starting weight: 206 lbs Starting date: 03/21/2019 Today's weight: 202 lbs Today's date: 04/04/2019 Total lbs lost to date: 4 Total lbs lost since last in-office visit: 4  Interim History: Leyana found it difficult to keep track of daily protein intake. She is trying to find a balance when work is busy and consuming lunch before late afternoon.  Subjective:   1. Vitamin D deficiency Treesa's Vit D level on 03/21/2019 was 26.1. I recommended starting Vit D supplementation. I discussed labs with the patient today.  2. Elevated vitamin B12 level Bobbyjo's B12 level on 03/21/2019 was 1,401. She denies OTC supplementation. I discussed labs with the patient today.  3. Insulin resistance Latice's insulin level on 03/21/2019 was 10.4, and A1c was 4.9 on 03/21/2019. Due to significant GI history, will treat with proper eating. I discussed labs with the patient today.  4. At risk for osteoporosis Emera is at higher risk of osteopenia and osteoporosis due to Vitamin D deficiency.   Assessment/Plan:   1. Vitamin D deficiency Low Vitamin D level contributes to fatigue and are associated with obesity, breast, and colon cancer. Mihaela agreed to start prescription Vitamin D 50,000 IU every week with no refills. She will follow-up for routine testing of Vitamin D, at least 2-3 times per year to avoid over-replacement. We will recheck labs in 3 months.  2. Elevated vitamin B12 level The diagnosis was reviewed with the patient. We will continue to monitor with  labs. Orders and follow up as documented in patient record.  3. Insulin resistance Donyel will continue to work on weight loss, diet, exercise. She will continue to follow adequate protein diet and reduce simple carbohydrates to help decrease the risk of diabetes. We will recheck labs in 3 months. Makyla agreed to follow-up with [place] as directed to closely monitor her progress.  4. At risk for osteoporosis Joceline was given approximately 30 minutes of osteoporosis prevention counseling today. Camber is at risk for osteopenia and osteoporosis due to her Vitamin D deficiency. She was encouraged to take her Vitamin D and follow her higher calcium diet and increase strengthening exercise to help strengthen her bones and decrease her risk of osteopenia and osteoporosis.  Repetitive spaced learning was employed today to elicit superior memory formation and behavioral change.  5. Class 1 obesity with serious comorbidity and body mass index (BMI) of 31.0 to 31.9 in adult, unspecified obesity type Cyntoria is currently in the action stage of change. As such, her goal is to continue with weight loss efforts. She has agreed to keeping a food journal and adhering to recommended goals of 1300-1600 calories and 85+ grams of protein daily.   Vauda will slowly increase daily fiber intake, ultimate goal is 20 grams of fiber per day.  Exercise goals: As is.  Behavioral modification strategies: increasing lean protein intake, decreasing simple carbohydrates and no skipping meals.  Samerah has agreed to follow-up with our clinic in 2 weeks. She was informed of the importance of frequent follow-up visits to maximize her success with intensive  lifestyle modifications for her multiple health conditions.   Objective:   Blood pressure 113/71, pulse 71, temperature 97.9 F (36.6 C), temperature source Oral, height 5\' 7"  (1.702 m), weight 202 lb (91.6 kg), SpO2 98 %. Body mass index is 31.64 kg/m.  General:  Cooperative, alert, well developed, in no acute distress. HEENT: Conjunctivae and lids unremarkable. Cardiovascular: Regular rhythm.  Lungs: Normal work of breathing. Neurologic: No focal deficits.   Lab Results  Component Value Date   CREATININE 0.67 03/21/2019   BUN 9 03/21/2019   NA 140 03/21/2019   K 4.0 03/21/2019   CL 104 03/21/2019   CO2 20 03/21/2019   Lab Results  Component Value Date   ALT 17 03/21/2019   AST 16 03/21/2019   ALKPHOS 91 03/21/2019   BILITOT 0.5 03/21/2019   Lab Results  Component Value Date   HGBA1C 4.9 03/21/2019   Lab Results  Component Value Date   INSULIN 10.9 03/21/2019   Lab Results  Component Value Date   TSH 0.788 03/21/2019   Lab Results  Component Value Date   CHOL 144 03/21/2019   HDL 44 03/21/2019   LDLCALC 84 03/21/2019   TRIG 82 03/21/2019   Lab Results  Component Value Date   WBC 5.6 03/21/2019   HGB 13.4 03/21/2019   HCT 39.4 03/21/2019   MCV 100 (H) 03/21/2019   PLT 228 03/21/2019   No results found for: IRON, TIBC, FERRITIN  Attestation Statements:   Reviewed by clinician on day of visit: allergies, medications, problem list, medical history, surgical history, family history, social history, and previous encounter notes.   I, Trixie Dredge, am acting as transcriptionist for Dennard Nip, MD.  I have reviewed the above documentation for accuracy and completeness, and I agree with the above. -  Dennard Nip, MD

## 2019-04-11 ENCOUNTER — Encounter (INDEPENDENT_AMBULATORY_CARE_PROVIDER_SITE_OTHER): Payer: Self-pay

## 2019-04-18 ENCOUNTER — Ambulatory Visit (INDEPENDENT_AMBULATORY_CARE_PROVIDER_SITE_OTHER): Payer: 59 | Admitting: Family Medicine

## 2019-04-18 DIAGNOSIS — R631 Polydipsia: Secondary | ICD-10-CM | POA: Diagnosis not present

## 2019-04-19 ENCOUNTER — Other Ambulatory Visit: Payer: Self-pay

## 2019-04-22 ENCOUNTER — Encounter: Payer: Self-pay | Admitting: Obstetrics & Gynecology

## 2019-04-22 ENCOUNTER — Other Ambulatory Visit: Payer: Self-pay

## 2019-04-22 ENCOUNTER — Ambulatory Visit (INDEPENDENT_AMBULATORY_CARE_PROVIDER_SITE_OTHER): Payer: 59 | Admitting: Obstetrics & Gynecology

## 2019-04-22 VITALS — BP 122/82 | HR 76 | Temp 97.1°F | Resp 14 | Ht 67.5 in | Wt 205.0 lb

## 2019-04-22 DIAGNOSIS — Z01419 Encounter for gynecological examination (general) (routine) without abnormal findings: Secondary | ICD-10-CM | POA: Diagnosis not present

## 2019-04-22 NOTE — Progress Notes (Signed)
49 y.o. G2P2 Married Karen [name] here for annual exam.  She is a new patient today.  Had a hysterectomy with Dr. Raphael Gibney 2010 due to fibroids and endometriosis.  She does have some pain issues.  She reports about 4 years ago, she stopped Dexilant that she'd been on for many years.  She had significant reaction to being off the Dexilant including nausea, diarrhea, weight loss.  Went back on the [place] but has stopped this now.  Has esophageal stricture and this has been stretched.  She is going to be tested for h pylori.  She has h/o IBS with diarrhea and constipation.  She has had three episodes of near bowel obstruction.  She is followed by Dr. Denice Paradise and had seen Dr. Oletta Lamas at [facility].  Will be transferred to Dr. Cristina Gong.  Does have episodic pain but this always seems related to her GI function.  Has been doing a food journal for the last two years.  Has done an elimination diet.  She is currently working with Dr. Leafy Ro for weight loss.    Denies vaginal bleeding.    No LMP recorded. Patient has had a hysterectomy.          Sexually active: Yes.    The current method of family planning is status post hysterectomy--ovaries remain.    Exercising: Yes.    waking Smoker:  no  Health Maintenance: Pap: 2018 normal per patient History of abnormal Pap:  no MMG: 11-28-18 3D/Neg/density B/BiRads1 Colonoscopy: 11-24-14 normal; next 5 years BMD:   n/a TDaP: 2019 Pneumonia vaccine(s):  Not indicated Shingrix:  Discussed today Hep C testing: No Hep C/HIV neg in pregnancy Screening Labs: screening lab work will be done next month with Dr. Fara Olden   reports that she has never smoked. She has never used smokeless tobacco. She reports previous alcohol use. She reports that she does not use drugs.  Past Medical History:  Diagnosis Date  . Adenomyosis   . Anemia   . Arthritis 2013   L knee gets steroid injections   . Asthmatic bronchitis 01/31/2017  . Back pain   . Chronic  constipation   . Diarrhea   . DVT of lower extremity (deep venous thrombosis) ([place])   . Dysmenorrhea   . Endometriosis   . Esophageal stricture   . Fibroids   . Gallbladder problem    hx of gallstones and surgery  . GERD (gastroesophageal reflux disease)   . Hiatal hernia   . IBS (irritable bowel syndrome)   . Interstitial cystitis   . Kidney stones   . Lactose intolerance   . Lower extremity edema   . Menorrhagia   . Migraine    with aura  . Recurrent upper respiratory infection (URI)   . Sebaceous cyst of breast, right lower inner quadrant 10/25/2012   Excised 11/21/12   . Sleep apnea   . Syncope, non cardiac     Past Surgical History:  Procedure Laterality Date  . ARTHROSCOPIC HAGLUNDS REPAIR    . BREAST CYST EXCISION Right 11/21/2012   Procedure: CYST EXCISION BREAST;  Surgeon: Haywood Lasso, MD;  Location: [facility];  Service: General;  Laterality: Right;  . BREAST EXCISIONAL BIOPSY Right 11/2012  . CESAREAN SECTION  04/19/1993  . CHOLECYSTECTOMY    . COLONOSCOPY  05/02/2011   Procedure: COLONOSCOPY;  Surgeon: Winfield Cunas., MD;  Location: [facility];  Service: Endoscopy;  Laterality: N/A;  . colonoscopy  11/04/2014  .  ESOPHAGEAL MANOMETRY N/A 11/04/2015   Procedure: ESOPHAGEAL MANOMETRY (EM);  Surgeon: Laurence Spates, MD;  Location: [facility];  Service: Endoscopy;  Laterality: N/A;  . ESOPHAGOGASTRODUODENOSCOPY  05/02/2011   Procedure: ESOPHAGOGASTRODUODENOSCOPY (EGD);  Surgeon: Winfield Cunas., MD;  Location: [facility];  Service: Endoscopy;  Laterality: N/A;  . ESOPHAGOGASTRODUODENOSCOPY N/A 09/01/2014   Procedure: ESOPHAGOGASTRODUODENOSCOPY (EGD);  Surgeon: Laurence Spates, MD;  Location: [facility];  Service: Endoscopy;  Laterality: N/A;  . KNEE SURGERY    . LAPAROSCOPY    . laproscopic abdominal    . [place] IMPEDANCE STUDY N/A 11/04/2015   Procedure: [place] IMPEDANCE STUDY;  Surgeon: Laurence Spates, MD;  Location: [facility];   Service: Endoscopy;  Laterality: N/A;  . VAGINAL HYSTERECTOMY  2010   TVH--ovaries remain    Current Outpatient Medications  Medication Sig Dispense Refill  . albuterol (VENTOLIN HFA) 108 (90 Base) MCG/ACT inhaler Inhale 1 puff into the lungs as needed.    . clindamycin (CLINDAGEL) 1 % gel Apply 1 application topically as needed.    . Dapsone 7.5 % GEL Apply 1 application topically as needed.    Kendall Flack 575 MG/5ML SYRP Take by mouth.    . levocetirizine (XYZAL) 5 MG tablet Take 1 tablet (5 mg total) by mouth every evening. 30 tablet 5  . Lifitegrast (XIIDRA) 5 % SOLN Apply to eye.    . Methylnaltrexone Bromide (RELISTOR) 150 MG TABS Take by mouth.    . Probiotic Product (PROBIOTIC PO) Take 1 capsule by mouth 4 (four) times daily as needed (for GI upset).    . Vitamin D, Ergocalciferol, (DRISDOL) 1.25 MG (50000 UNIT) CAPS capsule Take 1 capsule (50,000 Units total) by mouth every 7 (seven) days. 4 capsule 0   No current facility-administered medications for this visit.    Family History  Problem Relation Age of Onset  . Colon cancer Father 73  . Hypertension Father   . Stroke Father   . Heart disease Father   . Heart attack Father   . Hyperlipidemia Father   . Colon cancer Mother 46  . Diabetes Maternal Uncle   . Stroke Paternal Grandmother   . Heart attack Paternal Grandmother   . Heart attack Maternal Grandmother   . Heart attack Maternal Grandfather   . Cancer Maternal Uncle   . Hypertension Sister   . Diabetes Brother   . Breast cancer Neg Hx     Review of Systems  All other systems reviewed and are negative.   Exam:   BP 122/82 (Cuff Size: Large)   Pulse 76   Temp (!) 97.1 F (36.2 C) (Temporal)   Resp 14   Ht 5' 7.5" (1.715 m)   Wt 205 lb (93 kg)   BMI 31.63 kg/m   Height: 5' 7.5" (171.5 cm)  Ht Readings from Last 3 Encounters:  04/22/19 5' 7.5" (1.715 m)  04/04/19 5\' 7"  (1.702 m)  03/21/19 5\' 7"  (1.702 m)    General appearance: alert, cooperative  and appears stated age Head: Normocephalic, without obvious abnormality, atraumatic Neck: no adenopathy, supple, symmetrical, trachea midline and thyroid normal to inspection and palpation Lungs: clear to auscultation bilaterally Breasts: normal appearance, no masses or tenderness Heart: regular rate and rhythm Abdomen: soft, non-tender; bowel sounds normal; no masses,  no organomegaly Extremities: extremities normal, atraumatic, no cyanosis or edema Skin: Skin color, texture, turgor normal. No rashes or lesions Lymph nodes: Cervical, supraclavicular, and axillary nodes normal. No abnormal inguinal nodes palpated Neurologic: Grossly normal  Pelvic: External genitalia:  no lesions              Urethra:  normal appearing urethra with no masses, tenderness or lesions              Bartholins and Skenes: normal                 Vagina: normal appearing vagina with normal color and discharge, no lesions              Cervix: absent              Pap taken: No. Bimanual Exam:  Uterus:  uterus absent              Adnexa: no mass, fullness, tenderness               Rectovaginal: Confirms               Anus:  normal sphincter tone, no lesions  Chaperone, Marisa Sprinkles, CMA, was present for exam.  A:  Well Woman with normal exam IB with alternating diarrhea and constipation H/o DVT at age 42.  Has seen Dr. Marin Olp for consultation in 1997 once moved to [place].   Vit D deficiency H/o endometriosis with two laparoscopies and then TVH  P:   Mammogram guidelines reviewed pap smear not indicated due to hysterectomy IBS with intermittent diarrhea or constipation H/o migraines H/O DVT age 79 Consider Edward Qualia use Return annually or prn

## 2019-04-22 NOTE — Patient Instructions (Signed)
We may to consider a trial of Orilissa 200mg  daily.

## 2019-04-26 ENCOUNTER — Ambulatory Visit: Payer: 59 | Admitting: Dietician

## 2019-04-26 DIAGNOSIS — R109 Unspecified abdominal pain: Secondary | ICD-10-CM | POA: Diagnosis not present

## 2019-04-26 DIAGNOSIS — R197 Diarrhea, unspecified: Secondary | ICD-10-CM | POA: Diagnosis not present

## 2019-04-26 DIAGNOSIS — K591 Functional diarrhea: Secondary | ICD-10-CM | POA: Diagnosis not present

## 2019-04-29 ENCOUNTER — Encounter: Payer: 59 | Attending: Internal Medicine | Admitting: Dietician

## 2019-04-29 ENCOUNTER — Other Ambulatory Visit: Payer: Self-pay

## 2019-04-29 DIAGNOSIS — Z713 Dietary counseling and surveillance: Secondary | ICD-10-CM | POA: Insufficient documentation

## 2019-04-29 MED FILL — NEOMYCIN 500 MG TABLET: 500 | 14 days supply | Qty: 28 | Fill #0

## 2019-04-29 NOTE — Progress Notes (Signed)
[facility] Employee Wellness Visit  Visit 2 of 3 EID# [id number]   Primary concerns today: IBS, SIBO, insulin resistance Preferred learning style: no preference indicated Learning readiness: ready   NUTRITION ASSESSMENT   Clinical Medical Hx: asthma, constipation, diarrhea, esophageal stricture, cholecystectomy, GERD, hiatal hernia, IBS, kidney stones, lactose intolerance, sleep apnea, NEW: SIBO, insulin resistance Notable Signs/Symptoms: diarrhea, constipation   Lifestyle & Dietary Hx States she was recently diagnosed with SIBO and insulin resistance. Has since been prescribed an antibiotic and recommended to follow the low FODMAP diet, so we discussed this today. States she sticks between [phone] calories a day per Dr. Migdalia Dk recommendation. Asked questions about what to do for a low blood sugar, so we discussed how to incorporate carbohydrates and low FODMAP sugar sources for this. States she has decreased the amount of sugar she adds to beverages. Would like to eliminate dairy, and asked about appropriate dairy-free low FODMAP protein shakes she could try.    Estimated daily fluid intake: 15-20 cups water, 24-48 oz sweet tea  Supplements: ultra-flora probiotic  Sleep: 4-5 hours/night (has sleep apnea as well)   24-Hr Dietary Recall First Meal: 8am: Premier Protein shake + 1/2 cup applesauce + 1 Tbsp flaxseed + 10 oz prune juice + 24 oz English breakfast tea + 3 tsp sugar  Snack: Silk almond milk yogurt  Second Meal: 6-8 oz lean ground sirloin + 1/2 cup rice + 1/2 cup cooked kale  Snack: fruit cup   Third Meal: taco shell bowl +  taco meat + 2% mexican cheese + cooked kale + applesauce  Snack: - Beverages: water, sweet tea, Hint water, almond milk  Estimated Energy Needs Calories: 1600 Carbohydrate: 180g Protein: 100g Fat: 53g    NUTRITION EDUCATION & GOALS  . Gut health nutrition- low FODMAPs, increase water intake, stress management  Learning Style & Readiness for  Change Teaching method utilized: Visual & Auditory  Demonstrated degree of understanding via: Teach Back  Barriers to learning/adherence to lifestyle change: None Identified   Goals Established by Employee . Continue eating small frequent meals/snacks throughout the day.  . Continue to drink lots of water and limit sugar-sweetened beverages as much as possible.  . Start the low FODMAP diet elimination phase (to follow for ~6 weeks per MD recommendations).  Educational Handouts Provided   [employer] Treating IBS with a 3-Step FODMAP Diet  Larey Seat Your Happy Gut Guide  (also provided via email at [email or web address])     MONITORING & EVALUATION Dietary intake, weekly physical activity, and goals in 1 month.  Next Steps  Employee is to return to [facility] in 1 month for 3rd wellness visit.

## 2019-05-01 ENCOUNTER — Encounter: Payer: Self-pay | Admitting: Dietician

## 2019-05-01 DIAGNOSIS — R631 Polydipsia: Secondary | ICD-10-CM | POA: Diagnosis not present

## 2019-05-01 DIAGNOSIS — J4520 Mild intermittent asthma, uncomplicated: Secondary | ICD-10-CM | POA: Diagnosis not present

## 2019-05-01 DIAGNOSIS — E559 Vitamin D deficiency, unspecified: Secondary | ICD-10-CM | POA: Diagnosis not present

## 2019-05-01 DIAGNOSIS — K219 Gastro-esophageal reflux disease without esophagitis: Secondary | ICD-10-CM | POA: Diagnosis not present

## 2019-05-01 MED FILL — RELISTOR 150 MG TABLET: 150 | 30 days supply | Qty: 60 | Fill #1

## 2019-05-01 MED FILL — LEVOCETIRIZINE 5 MG TABLET: 5 | 30 days supply | Qty: 30 | Fill #0

## 2019-05-01 MED FILL — VIT D2 1.25 MG (50,000 UNIT: 1.25 MG | 28 days supply | Qty: 4 | Fill #0

## 2019-05-01 MED FILL — XIFAXAN 550 MG TABLET: 550 | 14 days supply | Qty: 42 | Fill #0

## 2019-05-01 NOTE — Patient Instructions (Signed)
.   Continue eating small frequent meals/snacks throughout the day.  . Continue to drink lots of water and limit sugar-sweetened beverages as much as possible.  . Start the low FODMAP diet elimination phase (to follow for ~6 weeks per MD recommendations).

## 2019-05-03 MED FILL — FLUCONAZOLE 100 MG TABLET: 100 | 3 days supply | Qty: 3 | Fill #0

## 2019-05-10 ENCOUNTER — Encounter (HOSPITAL_COMMUNITY): Payer: Self-pay

## 2019-05-23 MED FILL — VIT D2 1.25 MG (50,000 UNIT: 1.25 MG | 28 days supply | Qty: 4 | Fill #1

## 2019-05-24 ENCOUNTER — Encounter: Payer: Self-pay | Admitting: Dietician

## 2019-05-24 ENCOUNTER — Encounter: Payer: 59 | Attending: Internal Medicine | Admitting: Dietician

## 2019-05-24 ENCOUNTER — Other Ambulatory Visit: Payer: Self-pay

## 2019-05-24 DIAGNOSIS — Z713 Dietary counseling and surveillance: Secondary | ICD-10-CM | POA: Insufficient documentation

## 2019-05-24 NOTE — Progress Notes (Signed)
[facility] Employee Wellness Visit  Visit 3 of 3 EID# [id number]   Primary concerns today: IBS, SIBO, insulin resistance Preferred learning style: no preference indicated Learning readiness: ready   NUTRITION ASSESSMENT   Clinical Medical Hx: asthma, constipation, diarrhea, esophageal stricture, cholecystectomy, GERD, hiatal hernia, IBS, kidney stones, lactose intolerance, sleep apnea, NEW: SIBO, insulin resistance Notable Signs/Symptoms: diarrhea, constipation   Lifestyle & Dietary Hx States she has tried to stick to following the low FODMAP diet as much as possible and has seen some improvements in symptoms such as constipation and heartburn. States she feels as though she eats the same foods all the time and is limited. Foods eaten include hibachi vegetables and white rice, Sun Sweet prune juice, strawberries, blueberries, grapes, oranges, broccoli, carrots, spinach, taco salad made with 98% lean sirloin with 1 cup fresh fruit, [employer] chicken noodle soup with small waffle fry, and scrambled eggs with bacon bits or [place] sausage + Fairlife chocolate milk. States pinto beans cause gas.     Estimated daily fluid intake: 90+ oz water, 24-48 oz sweet tea  Supplements: ultra-flora probiotic  Sleep: 4-5 hours/night (has sleep apnea as well)   24-Hr Dietary Recall First Meal: Fairlife protein drink + orange  Snack: Late July quinoa chips   Second Meal: 1/2 order of hibachi grilled shrimp + white rice + carrots/zucchini   Snack: fruit cup   Third Meal: burger patty + side  Snack: - Beverages: water, sweet tea, Hint water, almond milk  Estimated Energy Needs Calories: 1600 Carbohydrate: 180g Protein: 100g Fat: 53g    NUTRITION EDUCATION & GOALS  . Gut health nutrition- low FODMAPs, increase water intake, stress management, fiber, snack ideas   Learning Style & Readiness for Change Teaching method utilized: Visual & Auditory  Demonstrated degree of understanding via: Teach Back   Barriers to learning/adherence to lifestyle change: None Identified   Goals Established by Employee . Continue eating small frequent meals/snacks throughout the day.  . Continue to drink lots of water and limit sugar-sweetened beverages as much as possible.  . Continue the low FODMAP diet elimination phase (to follow for ~6 weeks per MD recommendations).  Educational Handouts Provided   Epicured   Balanced Snacks     MONITORING & EVALUATION Dietary intake, weekly physical activity, and goals at next nutrition visit.   Next Steps  Employee is to contact [facility] to schedule nutrition education visit as needed.

## 2019-05-30 ENCOUNTER — Encounter: Payer: Self-pay | Admitting: Physical Therapy

## 2019-05-30 ENCOUNTER — Ambulatory Visit: Payer: 59 | Attending: Gastroenterology | Admitting: Physical Therapy

## 2019-05-30 ENCOUNTER — Other Ambulatory Visit: Payer: Self-pay

## 2019-05-30 DIAGNOSIS — M533 Sacrococcygeal disorders, not elsewhere classified: Secondary | ICD-10-CM | POA: Insufficient documentation

## 2019-05-30 DIAGNOSIS — M6281 Muscle weakness (generalized): Secondary | ICD-10-CM | POA: Insufficient documentation

## 2019-05-30 NOTE — Therapy (Addendum)
[place] MAIN [facility] SERVICES [address] [place], [place], [address] Phone: [phone]   Fax:  [phone]  Physical Therapy Evaluation  Patient Details  Name: Karen [name] MRN: [id number] Date of Birth: 1970-06-20 Referring Provider (PT): Rich    Encounter Date: 05/30/2019    Past Medical History:  Diagnosis Date  . Adenomyosis   . Anemia   . Arthritis 2013   L knee gets steroid injections   . Asthmatic bronchitis 01/31/2017  . Back pain   . Chronic constipation   . Diarrhea   . DVT of lower extremity (deep venous thrombosis) ([place])   . Dysmenorrhea   . Endometriosis   . Esophageal stricture   . GERD (gastroesophageal reflux disease)   . Hiatal hernia   . History of uterine fibroid   . IBS (irritable bowel syndrome)   . Interstitial cystitis   . Kidney stones   . Lactose intolerance   . Migraine    with aura  . Recurrent upper respiratory infection (URI)   . Sebaceous cyst of breast, right lower inner quadrant 10/25/2012   Excised 11/21/12   . Sleep apnea   . Syncope, non cardiac     Past Surgical History:  Procedure Laterality Date  . ARTHROSCOPIC HAGLUNDS REPAIR    . BREAST CYST EXCISION Right 11/21/2012   Procedure: CYST EXCISION BREAST;  Surgeon: Haywood Lasso, MD;  Location: [facility];  Service: General;  Laterality: Right;  . BREAST EXCISIONAL BIOPSY Right 11/2012  . CESAREAN SECTION  04/19/1993  . CHOLECYSTECTOMY    . COLONOSCOPY  05/02/2011   Procedure: COLONOSCOPY;  Surgeon: Winfield Cunas., MD;  Location: [facility];  Service: Endoscopy;  Laterality: N/A;  . colonoscopy  11/04/2014  . DENTAL SURGERY  03/06/2018   2 surgeries ( 03/06/2018 and 04/06/2018)   to remove two separate benign tumors  . ESOPHAGEAL MANOMETRY N/A 11/04/2015   Procedure: ESOPHAGEAL MANOMETRY (EM);  Surgeon: Laurence Spates, MD;  [facility];  Service: Endoscopy;  Laterality: N/A;  .  ESOPHAGOGASTRODUODENOSCOPY  05/02/2011   Procedure: ESOPHAGOGASTRODUODENOSCOPY (EGD);  Surgeon: Winfield Cunas., MD;  Location: [facility];  Service: Endoscopy;  Laterality: N/A;  . ESOPHAGOGASTRODUODENOSCOPY N/A 09/01/2014   Procedure: ESOPHAGOGASTRODUODENOSCOPY (EGD);  Surgeon: Laurence Spates, MD;  Location: [facility];  Service: Endoscopy;  Laterality: N/A;  . KNEE SURGERY    . LAPAROSCOPY    . laproscopic abdominal    . [place] IMPEDANCE STUDY N/A 11/04/2015   Procedure: [place] IMPEDANCE STUDY;  Surgeon: Laurence Spates, MD;  Location: [facility];  Service: Endoscopy;  Laterality: N/A;  . VAGINAL HYSTERECTOMY  2010   TVH--ovaries remain    There were no vitals filed for this visit.  Subjective Assessment - 06/10/19 0847    Subjective 1) constipation Pt is having diarrhea, constipation within the same week. Since Dec 2020, theses Sx became worst. Prior to this time, pt was having diarrhea once a month and constipation once and on and was managing with diet and Pelvic PT program. In March and April 2020, pt underwent two dental surgeries to remove benign tumor and had to take pain medications which caused constipations. Pt also had several minor surgeries due to infections and was put on antibiotics. Pt went through this process for several months until October.   In January 1st, pt fell and landed on L side. After the fall, pt went to the Urgent due to back pain and found she was  severely constipated.  Pt 's LBP got better after she underwent two bowel clean out ( 2 bottle of Magcitram and Miralax) . Prior to the fall, pt was having bowel movements daily ,Type 3-4, and was constipated 1-2 week with Type 1-2. After the fall, bowel movements daily but with Type 1-2. Once after her cleanouts, pt did not have back pain. Pt's last clean out was April and then she started antibiotics for SIBO which she was Dx.  Since that last clean out , pt's bowel movements does not have bowel movements for one day out  of the week and is not relying as much on teas for bowel movements. Pt has had Type 4 stools across 6 days per week. Pt has not had diarhea for the past 3 weeks since starking the antibtiotics. Pt is working with a nutritionist.   Pt is eating her meals without mutlitasking nor eating on the run.      Pt noticed when she is constipated, she has reflux symptoms. Pt has had these reflex and difficulty swallowing prior to dental surgeries which removed the benign tumors. Pt reports heart burn, esphogeal spasms with food getting stuck in the throat have been worsening  for the last 65months.   Pt feels she handling stress better since she first completed Pelvic PT in 2019 which helped pt improve diarrhea, urinary leakage. Pt has also started taking elderberry after a friend told her about this to help with her allergies and her asthma has been much better and she has less coughing fits which last year her respiratory infections were causing more downward force onto her pelvic floor causing leakage.     Pertinent History  Pt does not have a regular time she typically goes to the bathroom. 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    Patient Stated Goals  have a more comfortable pattern         [facility] PT Assessment - 06/10/19 0850      Assessment   Medical Diagnosis  constipation     Referring Provider (PT)  Rich       Precautions   Precautions  None      Restrictions   Weight Bearing Restrictions  No      Balance Screen   Has the patient fallen in the past 6 months  No      Prior Function   Level of Independence  Independent      Posture/Postural Control   Posture Comments  no forward posture       Palpation   SI assessment   L PSIS more posterior, deviated coccyx                  Pelvic Floor Special Questions - 06/10/19 0851    Diastasis Recti  neg        [facility] Adult PT  Treatment/Exercise - 06/10/19 0850      Therapeutic Activites    Other Therapeutic Activities  active listening to pt's GI Hx, following up questions       Manual Therapy   Manual therapy comments  long axis distraction LLE, PA mob/ rotational mob on L , superior glide at L coccyx                   PT Long Term Goals - 06/10/19 0902      PT LONG TERM GOAL #  1   Title  Pt will maintain regular bowel movements daily or every other day with Type 4 consistency 100% of the time across 2 months in order to minimzie low back pain and needing MagCitrate to clean out her bowels    Time  8    Period  Weeks    Status  New      PT LONG TERM GOAL #2   Title  Pt will demo no coccyx devation and no SIJ hypomobility on L across 2 visits in order to promote better elimination of bowels    Time  4    Period  Weeks    Status  New    [place] Date  07/08/19      PT LONG TERM GOAL #3   Title  Pt will report a regular time to sit on the toilet daily across 1 week  to entrain GI system for better bowel eliminations    Time  6    Period  Weeks    Status  New    [place] Date  07/22/19      PT LONG TERM GOAL #4   Title  Pt will complete a food diary    Time  4    Period  Weeks    Status  New    Target Date  07/08/19            Plan - 06/10/19 0858    Clinical Impression Statement Pt is a 49 yo female who reports of constipation with Type 1-2 stool type after a fall onto her L side that happened in Jan 2021. Pt also notices difficulty with swallowing and reflex that is worse with constipation.  PT assessment showed a deviated coccyx, hypomobility/ misalignment of  of L SIJ , hip and deep core weakness. Post Tx, pt demo'd more medially aligned coccyx and mobility at L SIJ. Pt's fall in January is likely related to her deviated coccyx which impacted her bowel movements. Plan to have pt complete a food diary and progress pt to pelvic girdle strengthening exercises. Pt benefits from skilled PT       Examination-Activity Limitations  Toileting    Clinical Decision Making  Moderate    Rehab Potential  Good    PT Frequency  1x / week    PT Duration  Other (comment)   10   PT Treatment/Interventions  Moist Heat;Functional mobility training;Therapeutic activities;Patient/family education;Therapeutic exercise;Manual techniques;Neuromuscular re-education;Gait training    Consulted and Agree with Plan of Care  Patient       Patient will benefit from skilled therapeutic intervention in order to improve the following deficits and impairments:  Decreased activity tolerance, Decreased strength, Decreased mobility, Impaired sensation, Postural dysfunction, Hypomobility, Decreased endurance, Decreased coordination, Decreased range of motion, Improper body mechanics, Increased muscle spasms  Visit Diagnosis: Muscle weakness (generalized)  Sacrococcygeal disorders, not elsewhere classified     Problem List Patient Active Problem List   Diagnosis Date Noted  . Obstructive sleep apnea treated with continuous positive airway pressure (CPAP) 06/16/2017  . Perennial allergic rhinitis with a nonallergic component 01/31/2017  . Asthmatic bronchitis 01/31/2017  . GI symptoms 01/31/2017  . Chest pain 12/13/2012  . Dyspnea 12/13/2012    Jerl Mina ,PT, DPT, E-RYT  06/10/2019, 9:21 AM  [place] MAIN [facility] SERVICES [address] [place], [place], [address] Phone: [phone]   Fax:  [phone]  Name: Karen [name] MRN: [id number] Date of Birth: [birth year]

## 2019-05-30 NOTE — Patient Instructions (Addendum)
complete food diary  Start regularly making time to sit on the toilet to entrain your bowels  7:30 Breaths and colon massage    __   To realign tailbone    Frog stretch: laying on belly with pillow under hips, knees bent, inhale do nothing, exhale let ankles fall apart   30 reps        On belly: Riding horse edge of mattress  knee bent like riding a horse, move knee towards armpit and out  10 reps  ___________________________  Strengthening :   3- foot tap 30 reps on each     Clam Shell 45 Degrees   Lying with hips and knees bent 45, one pillow between knees and ankles. Lift knee with exhale. Be sure pelvis does not roll backward. Do not arch back. Do 20 times, each leg, 2 times per day.   Complimentary stretch: [employer] _ foot over _ thigh, opposite knee straight  3 breaths  * Keep pelvis levelled with tactile cue with hand under back of hips  * Slide the ankle of the supporting foot out to decrease the angle which can help level the pelvis

## 2019-06-04 MED FILL — RELISTOR 150 MG TABLET: 150 | 30 days supply | Qty: 60 | Fill #2

## 2019-06-06 ENCOUNTER — Other Ambulatory Visit: Payer: Self-pay

## 2019-06-06 ENCOUNTER — Ambulatory Visit: Payer: 59 | Attending: Gastroenterology | Admitting: Physical Therapy

## 2019-06-06 DIAGNOSIS — M533 Sacrococcygeal disorders, not elsewhere classified: Secondary | ICD-10-CM | POA: Insufficient documentation

## 2019-06-06 DIAGNOSIS — R29898 Other symptoms and signs involving the musculoskeletal system: Secondary | ICD-10-CM | POA: Insufficient documentation

## 2019-06-06 DIAGNOSIS — R278 Other lack of coordination: Secondary | ICD-10-CM | POA: Insufficient documentation

## 2019-06-06 DIAGNOSIS — M6281 Muscle weakness (generalized): Secondary | ICD-10-CM | POA: Insufficient documentation

## 2019-06-06 NOTE — Therapy (Addendum)
[place] MAIN [facility] SERVICES [address] [place], [place], [address] Phone: [phone]   Fax:  [phone]  Physical Therapy Treatment  Patient Details  Name: Karen [name] MRN: [id number] Date of Birth: Oct 18, 1970 No data recorded  Encounter Date: 06/06/2019  PT End of Session - 06/06/19 1710    Visit Number  2    Number of Visits  10    Date for PT Re-Evaluation  08/08/19    PT Start Time  [id number]    PT Stop Time  [id number]    PT Time Calculation (min)  45 min       Past Medical History:  Diagnosis Date  . Adenomyosis   . Anemia   . Arthritis 2013   L knee gets steroid injections   . Asthmatic bronchitis 01/31/2017  . Back pain   . Chronic constipation   . Diarrhea   . DVT of lower extremity (deep venous thrombosis) ([place])   . Dysmenorrhea   . Endometriosis   . Esophageal stricture   . GERD (gastroesophageal reflux disease)   . Hiatal hernia   . History of uterine fibroid   . IBS (irritable bowel syndrome)   . Interstitial cystitis   . Kidney stones   . Lactose intolerance   . Migraine    with aura  . Recurrent upper respiratory infection (URI)   . Sebaceous cyst of breast, right lower inner quadrant 10/25/2012   Excised 11/21/12   . Sleep apnea   . Syncope, non cardiac     Past Surgical History:  Procedure Laterality Date  . ARTHROSCOPIC HAGLUNDS REPAIR    . BREAST CYST EXCISION Right 11/21/2012   Procedure: CYST EXCISION BREAST;  Surgeon: Haywood Lasso, MD;  Location: [place];  Service: General;  Laterality: Right;  . BREAST EXCISIONAL BIOPSY Right 11/2012  . CESAREAN SECTION  04/19/1993  . CHOLECYSTECTOMY    . COLONOSCOPY  05/02/2011   Procedure: COLONOSCOPY;  Surgeon: Winfield Cunas., MD;  Location: [facility];  Service: Endoscopy;  Laterality: N/A;  . colonoscopy  11/04/2014  . DENTAL SURGERY  03/06/2018   2 surgeries ( 03/06/2018 and 04/06/2018)   to remove two separate benign tumors  .  ESOPHAGEAL MANOMETRY N/A 11/04/2015   Procedure: ESOPHAGEAL MANOMETRY (EM);  Surgeon: Laurence Spates, MD;  [facility];  Service: Endoscopy;  Laterality: N/A;  . ESOPHAGOGASTRODUODENOSCOPY  05/02/2011   Procedure: ESOPHAGOGASTRODUODENOSCOPY (EGD);  Surgeon: Winfield Cunas., MD;  Location: [facility];  Service: Endoscopy;  Laterality: N/A;  . ESOPHAGOGASTRODUODENOSCOPY N/A 09/01/2014   Procedure: ESOPHAGOGASTRODUODENOSCOPY (EGD);  Surgeon: Laurence Spates, MD;  Location: [facility];  Service: Endoscopy;  Laterality: N/A;  . KNEE SURGERY    . LAPAROSCOPY    . laproscopic abdominal    . [place] IMPEDANCE STUDY N/A 11/04/2015   Procedure: [place] IMPEDANCE STUDY;  Surgeon: Laurence Spates, MD;  [facility];  Service: Endoscopy;  Laterality: N/A;  . VAGINAL HYSTERECTOMY  2010   TVH--ovaries remain    There were no vitals filed for this visit.  Subjective Assessment - 06/06/19 1711    Subjective  Pt felt sore after last session. Pt is in her constipation phase. Pt has not taken any prune juice this week. Pt has had bowel movements every day but it has been Type 1 stool.    Pertinent History  Pt does not have a regular time she typically goes to the bathroom. 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    Patient Stated Goals  have a more comfortable pattern         Assessment:  Coccyx medially aligned  Birddog with upper trap overuse,  Hyperextension of elbows  Minisquat with posterior BOS                [facility] Adult PT Treatment/Exercise - 06/06/19 1753      Therapeutic Activites    Therapeutic Activities  Other Therapeutic Activities    Other Therapeutic Activities  discussed referral to wholistic nutritionists       Neuro Re-ed    Neuro Re-ed Details   cued for proper technique in birddog, less uper trap overuse, hyperextension of elbows,  cued for minisquat technique  less posterior weight on heels                  PT Long Term Goals - 05/30/19 1708      PT LONG TERM GOAL #1   Title  Pt will maintain regular bowel movements daily or every other day with Type 4 consistency 100% of the time across 2 months in order to minimzie low back pain and needing MagCitrate to clean out her bowels    Time  8    Period  Weeks    Status  New    Target Date  07/25/19            Plan - 06/06/19 1755    Clinical Impression Statement  Pt 's coccyx is no longer deviated and SIJ mobility has been restored. Progressed to strengthening and promoting anterior pelvic tilt to promote lengthening of pelvic floor mm, nutation of sacrum. Pt required excessive cues for less hyperextensions of elbows in quadriped position and proper technique in mini squats. Pt continues to benefit from skilled PT      Examination-Activity Limitations Toileting     Rehab Potential Good    PT Frequency 1x / week    PT Duration Other (comment)   10   PT Treatment/Interventions Moist Heat;Functional mobility training;Therapeutic activities;Patient/family education;Therapeutic exercise;Manual techniques;Neuromuscular re-education;Gait training    Consulted and Agree with Plan of Care Patient        Patient will benefit from skilled therapeutic intervention in order to improve the following deficits and impairments:     Visit Diagnosis: Muscle weakness (generalized)  Sacrococcygeal disorders, not elsewhere classified  Other symptoms and signs involving the musculoskeletal system     Problem List Patient Active Problem List   Diagnosis Date Noted  . Obstructive sleep apnea treated with continuous positive airway pressure (CPAP) 06/16/2017  . Perennial allergic rhinitis with a nonallergic component 01/31/2017  . Asthmatic bronchitis 01/31/2017  . GI symptoms 01/31/2017  . Chest pain 12/13/2012  . Dyspnea 12/13/2012    Jerl Mina ,PT, DPT,  E-RYT  06/06/2019, 5:57 PM  [place] MAIN [facility] SERVICES [address] [place], [place], [address] Phone: [phone]   Fax:  [phone]  Name: Karen [name] MRN: [id number] Date of Birth: 10-13-1970

## 2019-06-06 NOTE — Patient Instructions (Signed)
Birddog   Table top position,  6 points of contact: paw hands, knees hip width apart, toes tucked under  Shoulders down and back like squeezing armpits  Not sagging back   L arm up , thumbs up, arm is out like a half"V" shoulder blade slides down and back  R knee straight, toes tucked on the ground,  Lengthen whole spine as if yard stick is balanced on spine, chin tucked   L + R = 1 rep 10 reps   X 2   ___  Minisquat: Scoot buttocks back slight, hinge like you are looking at your reflection on a pond  Knees behind toes,  Inhale to "smell flowers"  Exhale on the rise "like rocket"  Do not lock knees, have more weight across ballmounds of feet, toes relaxed   THEN HALF step on both feet first lap with left foot leading down a hall way = 1 lap  Repeated with other foot leading =1 lap  2 laps each side

## 2019-06-13 ENCOUNTER — Other Ambulatory Visit: Payer: Self-pay

## 2019-06-13 ENCOUNTER — Ambulatory Visit: Payer: 59 | Admitting: Physical Therapy

## 2019-06-13 DIAGNOSIS — R29898 Other symptoms and signs involving the musculoskeletal system: Secondary | ICD-10-CM | POA: Diagnosis not present

## 2019-06-13 DIAGNOSIS — M533 Sacrococcygeal disorders, not elsewhere classified: Secondary | ICD-10-CM | POA: Diagnosis not present

## 2019-06-13 DIAGNOSIS — M6281 Muscle weakness (generalized): Secondary | ICD-10-CM

## 2019-06-13 DIAGNOSIS — R278 Other lack of coordination: Secondary | ICD-10-CM | POA: Diagnosis not present

## 2019-06-13 NOTE — Therapy (Signed)
[place] MAIN [facility] SERVICES [address] [place], [place], [address] Phone: [phone]   Fax:  [phone]  Physical Therapy Treatment  Patient Details  Name: Karen [name] MRN: [id number] Date of Birth: 1970/06/17 Referring Provider (PT): Rich    Encounter Date: 06/13/2019   PT End of Session - 06/13/19 3151    Visit Number 3    Number of Visits 10    Date for PT Re-Evaluation 08/08/19    PT Start Time 7616    PT Stop Time 0737    PT Time Calculation (min) 48 min           Past Medical History:  Diagnosis Date  . Adenomyosis   . Anemia   . Arthritis 2013   L knee gets steroid injections   . Asthmatic bronchitis 01/31/2017  . Back pain   . Chronic constipation   . Diarrhea   . DVT of lower extremity (deep venous thrombosis) ([place])   . Dysmenorrhea   . Endometriosis   . Esophageal stricture   . GERD (gastroesophageal reflux disease)   . Hiatal hernia   . History of uterine fibroid   . IBS (irritable bowel syndrome)   . Interstitial cystitis   . Kidney stones   . Lactose intolerance   . Migraine    with aura  . Recurrent upper respiratory infection (URI)   . Sebaceous cyst of breast, right lower inner quadrant 10/25/2012   Excised 11/21/12   . Sleep apnea   . Syncope, non cardiac     Past Surgical History:  Procedure Laterality Date  . ARTHROSCOPIC HAGLUNDS REPAIR    . BREAST CYST EXCISION Right 11/21/2012   Procedure: CYST EXCISION BREAST;  Surgeon: Haywood Lasso, MD;  Location: [place];  Service: General;  Laterality: Right;  . BREAST EXCISIONAL BIOPSY Right 11/2012  . CESAREAN SECTION  04/19/1993  . CHOLECYSTECTOMY    . COLONOSCOPY  05/02/2011   Procedure: COLONOSCOPY;  Surgeon: Winfield Cunas., MD;  Location: [facility];  Service: Endoscopy;  Laterality: N/A;  . colonoscopy  11/04/2014  . DENTAL SURGERY  03/06/2018   2 surgeries ( 03/06/2018 and 04/06/2018)   to remove two separate  benign tumors  . ESOPHAGEAL MANOMETRY N/A 11/04/2015   Procedure: ESOPHAGEAL MANOMETRY (EM);  Surgeon: Laurence Spates, MD;  [facility];  Service: Endoscopy;  Laterality: N/A;  . ESOPHAGOGASTRODUODENOSCOPY  05/02/2011   Procedure: ESOPHAGOGASTRODUODENOSCOPY (EGD);  Surgeon: Winfield Cunas., MD;  Location: [facility];  Service: Endoscopy;  Laterality: N/A;  . ESOPHAGOGASTRODUODENOSCOPY N/A 09/01/2014   Procedure: ESOPHAGOGASTRODUODENOSCOPY (EGD);  Surgeon: Laurence Spates, MD;  Location: [facility];  Service: Endoscopy;  Laterality: N/A;  . KNEE SURGERY    . LAPAROSCOPY    . laproscopic abdominal    . [place] IMPEDANCE STUDY N/A 11/04/2015   Procedure: [place] IMPEDANCE STUDY;  Surgeon: Laurence Spates, MD;  [facility];  Service: Endoscopy;  Laterality: N/A;  . VAGINAL HYSTERECTOMY  2010   TVH--ovaries remain    There were no vitals filed for this visit.   Subjective Assessment - 06/13/19 1707    Subjective Pt has started to drink boiled water first thing in the morning which has helped with having Type 4 stools  across 5 out of 7 days. Pt has followed the recommendation to not start the day with cold shakes and instead , start with warm water and having shakes in the afternoon. The birddog exercise was  hurting her elbow bc of her Hx of tenditinis and she laid off of that exercise.    Pertinent History Pt does not have a regular time she typically goes to the bathroom. 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    Patient Stated Goals have a more comfortable pattern              [facility] PT Assessment - 06/13/19 1803      Posture/Postural Control   Posture Comments hyperextednded knees, lumbar lordosis                          [facility] Adult PT Treatment/Exercise - 06/13/19 1801      Therapeutic Activites    Other Therapeutic Activities educated about adding more fiber  to diet ( general information)       Neuro Re-ed    Neuro Re-ed Details  cued for more anterior COM, less hyperextended knees , moinimize shorten posterior pelvic floo rmm , cued for less lumbar lordosis       Exercises   Exercises --   customized HEP to minimize lordosis, strengthen quads                       PT Long Term Goals - 06/10/19 0902      PT LONG TERM GOAL #1   Title Pt will maintain regular bowel movements daily or every other day with Type 4 consistency 100% of the time across 2 months in order to minimzie low back pain and needing MagCitrate to clean out her bowels    Time 8    Period Weeks    Status New      PT LONG TERM GOAL #2   Title Pt will demo no coccyx devation and no SIJ hypomobility on L across 2 visits in order to promote better elimination of bowels    Time 4    Period Weeks    Status New    Target Date 07/08/19      PT LONG TERM GOAL #3   Title Pt will report a regular time to sit on the toilet daily across 1 week  to entrain GI system for better bowel eliminations    Time 6    Period Weeks    Status New    Target Date 07/22/19      PT LONG TERM GOAL #4   Title Pt will complete a food diary    Time 4    Period Weeks    Status New    Target Date 07/08/19                 Plan - 06/13/19 1715    Clinical Impression Statement Pt is having better stool consistency across 5 days out of 7 days after following recommendation to have warm / hot water first thing in the morning instead of cold shakes. General education was provided to increase fiber into diet. Pt's posture with hyperextension of knees/ lumbar lordosis was addressed with customized exercises and excessive cuing. Pt continues to benefit from skilled PT    Examination-Activity Limitations Toileting    Rehab Potential Good    PT Frequency 1x / week    PT Duration Other (comment)   10   PT Treatment/Interventions Moist Heat;Functional mobility training;Therapeutic  activities;Patient/family education;Therapeutic exercise;Manual techniques;Neuromuscular re-education;Gait training  Consulted and Agree with Plan of Care Patient           Patient will benefit from skilled therapeutic intervention in order to improve the following deficits and impairments:  Decreased activity tolerance, Decreased strength, Decreased mobility, Impaired sensation, Postural dysfunction, Hypomobility, Decreased endurance, Decreased coordination, Decreased range of motion, Improper body mechanics, Increased muscle spasms  Visit Diagnosis: Muscle weakness (generalized)  Sacrococcygeal disorders, not elsewhere classified  Other symptoms and signs involving the musculoskeletal system  Other lack of coordination     Problem List Patient Active Problem List   Diagnosis Date Noted  . Obstructive sleep apnea treated with continuous positive airway pressure (CPAP) 06/16/2017  . Perennial allergic rhinitis with a nonallergic component 01/31/2017  . Asthmatic bronchitis 01/31/2017  . GI symptoms 01/31/2017  . Chest pain 12/13/2012  . Dyspnea 12/13/2012    Jerl Mina 06/13/2019, 6:03 PM  [place] MAIN [facility] SERVICES [address] [place], [place], [address] Phone: [phone]   Fax:  [phone]  Name: Karen [name] MRN: [id number] Date of Birth: [birth year]

## 2019-06-13 NOTE — Patient Instructions (Addendum)
Continue with switching cold shakes to afternoon and warm water in the morning   Add more color to your diet with Something green , yellow, orange, red into lunch and dinner   Snacks: Carrots, celery hummus , nuts   Think how did humans eat prior to industrial revolution   Decrease processed foods intake because it takes more energy to break down and it can clog up the GI   ___  [place] of mass more anterior Unlocked knees to not tighten rectum    ____ Multifidis twist  Band is on doorknob: stand further away from door (facing perpendicular)   Twisting trunk without moving the hips and knees Hold band at the level of ribcage, elbows bent,shoulder blades roll back and down like squeezing a pencil under armpit    Exhale twist,.10-15 deg away from door without moving your hips/ knees. Continue to maintain equal weight through legs. Keep knee unlocked.  10 x 2   ____ Stand 45 deg to doorway, front leg is opp of hand holding band , leaning shoulder forward over knee, more weight in back leg    "Drawing a sword" " Pulling a lawnmover" Band is under feet, hold band in L hand, across body by R pocket  Press downward into the ballmounds and heels of the feet, thigh muscle active, Keep knees and hips squared the front and do not move them throughout the activity, Exhale to pull band from R pocket,  across the face to the R pocket, rotating only your ribcage to L as if "drawing a sword"    10 x 2 reps    ____ Wall squats with posterior tilt of pelvis ,  30 reps

## 2019-06-17 DIAGNOSIS — K219 Gastro-esophageal reflux disease without esophagitis: Secondary | ICD-10-CM | POA: Diagnosis not present

## 2019-06-17 DIAGNOSIS — Z13220 Encounter for screening for lipoid disorders: Secondary | ICD-10-CM | POA: Diagnosis not present

## 2019-06-17 DIAGNOSIS — K5900 Constipation, unspecified: Secondary | ICD-10-CM | POA: Diagnosis not present

## 2019-06-17 DIAGNOSIS — K582 Mixed irritable bowel syndrome: Secondary | ICD-10-CM | POA: Diagnosis not present

## 2019-06-17 DIAGNOSIS — M6289 Other specified disorders of muscle: Secondary | ICD-10-CM | POA: Diagnosis not present

## 2019-06-17 DIAGNOSIS — Z0000 Encounter for general adult medical examination without abnormal findings: Secondary | ICD-10-CM | POA: Diagnosis not present

## 2019-06-17 DIAGNOSIS — R1310 Dysphagia, unspecified: Secondary | ICD-10-CM | POA: Diagnosis not present

## 2019-06-17 DIAGNOSIS — E559 Vitamin D deficiency, unspecified: Secondary | ICD-10-CM | POA: Diagnosis not present

## 2019-06-17 DIAGNOSIS — J4520 Mild intermittent asthma, uncomplicated: Secondary | ICD-10-CM | POA: Diagnosis not present

## 2019-06-20 ENCOUNTER — Other Ambulatory Visit: Payer: Self-pay

## 2019-06-20 ENCOUNTER — Ambulatory Visit: Payer: 59 | Admitting: Physical Therapy

## 2019-06-20 DIAGNOSIS — R278 Other lack of coordination: Secondary | ICD-10-CM

## 2019-06-20 DIAGNOSIS — M6281 Muscle weakness (generalized): Secondary | ICD-10-CM

## 2019-06-20 DIAGNOSIS — R29898 Other symptoms and signs involving the musculoskeletal system: Secondary | ICD-10-CM

## 2019-06-20 DIAGNOSIS — M533 Sacrococcygeal disorders, not elsewhere classified: Secondary | ICD-10-CM | POA: Diagnosis not present

## 2019-06-20 NOTE — Patient Instructions (Addendum)
Discontinue birddog, and band exercise due to elbow pain   Strengthening core:   1)   WALKING WITH RESISTANCE  Green Band at waist connected to doorknob 77mins  X 3    Stepping forward normal length steps, planting mid and forefoot down, center of mass ( navel) leans forward slightly as if you were walking uphill 3-4 steps till band feels taut ( MAKE SURE THE DOOR IS LOCKED AND WON'T OPEN)   Stepping backwards, lower heel slowly, carry trunk and hips back as you step  ___  2) Build up to 1 mile daily of walking    Post walking stretches  ____  3) Continue with deep core level 2 ( knee out ) 6 min      _______  See ergonomic workstation sheet  AVOID PROPPING FEET UP, CORSSING ANKLES AND SITTING ON YOUR TAILBONE  Make sure to sit with 90 deg of shin bone to ground Feet flat under knees,  Sit on sitting bones   4 points of contact  Front edge of chair   Sometimes figure 4 sitting    ____  Happy baby pose     _____ Continue with adding veggies and fruits, nuts into diet   Continue with making time to sit on toilet at the same time daily.

## 2019-06-21 NOTE — Therapy (Addendum)
[place] MAIN [facility] SERVICES [address] [place], [place], [address] Phone: [phone]   Fax:  [phone]  Physical Therapy Treatment  Patient Details  Name: Karen [name] MRN: [id number] Date of Birth: Jan 25, 1970 Referring Provider (PT): Rich    Encounter Date: 06/20/2019   PT End of Session - 06/20/19 1723    Visit Number 4    Number of Visits 10    Date for PT Re-Evaluation 08/08/19    PT Start Time 8841    PT Stop Time 1800    PT Time Calculation (min) 55 min           Past Medical History:  Diagnosis Date  . Adenomyosis   . Anemia   . Arthritis 2013   L knee gets steroid injections   . Asthmatic bronchitis 01/31/2017  . Back pain   . Chronic constipation   . Diarrhea   . DVT of lower extremity (deep venous thrombosis) ([place])   . Dysmenorrhea   . Endometriosis   . Esophageal stricture   . GERD (gastroesophageal reflux disease)   . Hiatal hernia   . History of uterine fibroid   . IBS (irritable bowel syndrome)   . Interstitial cystitis   . Kidney stones   . Lactose intolerance   . Migraine    with aura  . Recurrent upper respiratory infection (URI)   . Sebaceous cyst of breast, right lower inner quadrant 10/25/2012   Excised 11/21/12   . Sleep apnea   . Syncope, non cardiac     Past Surgical History:  Procedure Laterality Date  . ARTHROSCOPIC HAGLUNDS REPAIR    . BREAST CYST EXCISION Right 11/21/2012   Procedure: CYST EXCISION BREAST;  Surgeon: Haywood Lasso, MD;  Location: [place];  Service: General;  Laterality: Right;  . BREAST EXCISIONAL BIOPSY Right 11/2012  . CESAREAN SECTION  04/19/1993  . CHOLECYSTECTOMY    . COLONOSCOPY  05/02/2011   Procedure: COLONOSCOPY;  Surgeon: Winfield Cunas., MD;  Location: [facility];  Service: Endoscopy;  Laterality: N/A;  . colonoscopy  11/04/2014  . DENTAL SURGERY  03/06/2018   2 surgeries ( 03/06/2018 and 04/06/2018)   to remove two separate  benign tumors  . ESOPHAGEAL MANOMETRY N/A 11/04/2015   Procedure: ESOPHAGEAL MANOMETRY (EM);  Surgeon: Laurence Spates, MD;  [facility];  Service: Endoscopy;  Laterality: N/A;  . ESOPHAGOGASTRODUODENOSCOPY  05/02/2011   Procedure: ESOPHAGOGASTRODUODENOSCOPY (EGD);  Surgeon: Winfield Cunas., MD;  Location: [facility];  Service: Endoscopy;  Laterality: N/A;  . ESOPHAGOGASTRODUODENOSCOPY N/A 09/01/2014   Procedure: ESOPHAGOGASTRODUODENOSCOPY (EGD);  Surgeon: Laurence Spates, MD;  Location: [facility];  Service: Endoscopy;  Laterality: N/A;  . KNEE SURGERY    . LAPAROSCOPY    . laproscopic abdominal    . [place] IMPEDANCE STUDY N/A 11/04/2015   Procedure: [place] IMPEDANCE STUDY;  Surgeon: Laurence Spates, MD;  [facility];  Service: Endoscopy;  Laterality: N/A;  . VAGINAL HYSTERECTOMY  2010   TVH--ovaries remain    There were no vitals filed for this visit.   Subjective Assessment - 06/20/19 1709    Subjective Pt reported she choked on her shake and her throat feels sore. Pt had daily bowel movements and Type 4 occured across 4 days. There is one exercise with the band and birddog  that irritates her elbow tendonitiis so she is not performing them.  Pt reported her elbow improved 89% after she completed OT. Pt  walk 0.5 miles daily. Pt is making time to sit on the toilet at the same time everytime.    Pertinent History Pt does not have a regular time she typically goes to the bathroom. 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    Patient Stated Goals have a more comfortable pattern              [facility] PT Assessment - 06/21/19 1105      Ambulation/Gait   Gait Comments poor co-activation of deep core in gait, shoulders/ COM more           Palpation:  Improved SIJ mobility       Treatment:  Therapeutic Activities:  Discussed fitness goals, explained progression with new HEP    Therapeutic Exercise: see pt instructions to promote aerobic activity  Neuro Reedu:  Cued for proper form in squats and co-activaiton of transverse arch  Manual Tx: reassessment of coccyx/ SIJ                         PT Long Term Goals - 06/10/19 0902      PT LONG TERM GOAL #1   Title Pt will maintain regular bowel movements daily or every other day with Type 4 consistency 100% of the time across 2 months in order to minimzie low back pain and needing MagCitrate to clean out her bowels    Time 8    Period Weeks    Status New      PT LONG TERM GOAL #2   Title Pt will demo no coccyx devation and no SIJ hypomobility on L across 2 visits in order to promote better elimination of bowels    Time 4    Period Weeks    Status New    Target Date 07/08/19      PT LONG TERM GOAL #3   Title Pt will report a regular time to sit on the toilet daily across 1 week  to entrain GI system for better bowel eliminations    Time 6    Period Weeks    Status New    Target Date 07/22/19      PT LONG TERM GOAL #4   Title Pt will complete a food diary    Time 4    Period Weeks    Status New    Target Date 07/08/19                 Plan - 06/20/19 1723    Clinical Impression Statement Pt showed increased mobility at coccyx and SIJ. Progressed pt to fitness exercises which pt required cues for proper form, less lumbar lordosis, and more co-activaiton of lower kinetic chain. Modified exercises as pt was not able to perform birddog due to elbow pain. New modifications were performed without pain. Pt continues to benefit from skilled PT.    Examination-Activity Limitations Toileting    Rehab Potential Good    PT Frequency 1x / week    PT Duration Other (comment)   10   PT Treatment/Interventions Moist Heat;Functional mobility training;Therapeutic activities;Patient/family education;Therapeutic exercise;Manual techniques;Neuromuscular re-education;Gait training    Consulted and  Agree with Plan of Care Patient           Patient will benefit from skilled therapeutic intervention in order to improve the following deficits and impairments:  Decreased activity tolerance, Decreased strength,  Decreased mobility, Impaired sensation, Postural dysfunction, Hypomobility, Decreased endurance, Decreased coordination, Decreased range of motion, Improper body mechanics, Increased muscle spasms  Visit Diagnosis: Muscle weakness (generalized)  Sacrococcygeal disorders, not elsewhere classified  Other symptoms and signs involving the musculoskeletal system  Other lack of coordination     Problem List Patient Active Problem List   Diagnosis Date Noted  . Obstructive sleep apnea treated with continuous positive airway pressure (CPAP) 06/16/2017  . Perennial allergic rhinitis with a nonallergic component 01/31/2017  . Asthmatic bronchitis 01/31/2017  . GI symptoms 01/31/2017  . Chest pain 12/13/2012  . Dyspnea 12/13/2012    Jerl Mina  ,PT, DPT, E-RYT 06/21/2019, 11:17 AM  [place] MAIN [facility] SERVICES [address] [place], [place], [address] Phone: [phone]   Fax:  [phone]  Name: Karen [name] MRN: [id number] Date of Birth: [birth year]

## 2019-06-23 NOTE — Addendum Note (Signed)
Addended by: Jerl Mina on: 06/23/2019 01:15 PM   Modules accepted: Orders

## 2019-06-24 MED FILL — VIT D2 1.25 MG (50,000 UNIT: 1.25 MG | 28 days supply | Qty: 4 | Fill #2

## 2019-06-26 DIAGNOSIS — R0789 Other chest pain: Secondary | ICD-10-CM | POA: Diagnosis not present

## 2019-06-26 DIAGNOSIS — K5901 Slow transit constipation: Secondary | ICD-10-CM | POA: Diagnosis not present

## 2019-06-26 DIAGNOSIS — K5902 Outlet dysfunction constipation: Secondary | ICD-10-CM | POA: Diagnosis not present

## 2019-06-26 DIAGNOSIS — K219 Gastro-esophageal reflux disease without esophagitis: Secondary | ICD-10-CM | POA: Diagnosis not present

## 2019-06-26 DIAGNOSIS — K929 Disease of digestive system, unspecified: Secondary | ICD-10-CM | POA: Diagnosis not present

## 2019-06-27 ENCOUNTER — Ambulatory Visit (INDEPENDENT_AMBULATORY_CARE_PROVIDER_SITE_OTHER): Payer: 59 | Admitting: Otolaryngology

## 2019-06-27 ENCOUNTER — Encounter (INDEPENDENT_AMBULATORY_CARE_PROVIDER_SITE_OTHER): Payer: Self-pay | Admitting: Otolaryngology

## 2019-06-27 ENCOUNTER — Ambulatory Visit: Payer: 59 | Admitting: Physical Therapy

## 2019-06-27 ENCOUNTER — Other Ambulatory Visit: Payer: Self-pay

## 2019-06-27 VITALS — Temp 97.5°F

## 2019-06-27 DIAGNOSIS — M6281 Muscle weakness (generalized): Secondary | ICD-10-CM | POA: Diagnosis not present

## 2019-06-27 DIAGNOSIS — K219 Gastro-esophageal reflux disease without esophagitis: Secondary | ICD-10-CM | POA: Diagnosis not present

## 2019-06-27 DIAGNOSIS — R278 Other lack of coordination: Secondary | ICD-10-CM

## 2019-06-27 DIAGNOSIS — R29898 Other symptoms and signs involving the musculoskeletal system: Secondary | ICD-10-CM

## 2019-06-27 DIAGNOSIS — M533 Sacrococcygeal disorders, not elsewhere classified: Secondary | ICD-10-CM | POA: Diagnosis not present

## 2019-06-27 NOTE — Progress Notes (Signed)
HPI: Karen [name] is a 49 y.o. female who returns today for evaluation of abnormally appearing left tonsil.  She is followed with GI because of history of throat spasms and reflux.  She recently had her esophagus stretched.  She had a spasm episode that caused her to have a sore throat and when she looked at her throat she noticed some abnormality on the left tonsil..  She got choked badly on 06/19/2019 and still has slight sore throat throat and discomfort.  Past Medical History:  Diagnosis Date  . Adenomyosis   . Anemia   . Arthritis 2013   L knee gets steroid injections   . Asthmatic bronchitis 01/31/2017  . Back pain   . Chronic constipation   . Diarrhea   . DVT of lower extremity (deep venous thrombosis) ([place])   . Dysmenorrhea   . Endometriosis   . Esophageal stricture   . GERD (gastroesophageal reflux disease)   . Hiatal hernia   . History of uterine fibroid   . IBS (irritable bowel syndrome)   . Interstitial cystitis   . Kidney stones   . Lactose intolerance   . Migraine    with aura  . Recurrent upper respiratory infection (URI)   . Sebaceous cyst of breast, right lower inner quadrant 10/25/2012   Excised 11/21/12   . Sleep apnea   . Syncope, non cardiac    Past Surgical History:  Procedure Laterality Date  . ARTHROSCOPIC HAGLUNDS REPAIR    . BREAST CYST EXCISION Right 11/21/2012   Procedure: CYST EXCISION BREAST;  Surgeon: Haywood Lasso, MD;  Location: [place];  Service: General;  Laterality: Right;  . BREAST EXCISIONAL BIOPSY Right 11/2012  . CESAREAN SECTION  04/19/1993  . CHOLECYSTECTOMY    . COLONOSCOPY  05/02/2011   Procedure: COLONOSCOPY;  Surgeon: Winfield Cunas., MD;  Location: [facility];  Service: Endoscopy;  Laterality: N/A;  . colonoscopy  11/04/2014  . DENTAL SURGERY  03/06/2018   2 surgeries ( 03/06/2018 and 04/06/2018)   to remove two separate benign tumors  . ESOPHAGEAL MANOMETRY N/A 11/04/2015   Procedure: ESOPHAGEAL  MANOMETRY (EM);  Surgeon: Laurence Spates, MD;  [facility];  Service: Endoscopy;  Laterality: N/A;  . ESOPHAGOGASTRODUODENOSCOPY  05/02/2011   Procedure: ESOPHAGOGASTRODUODENOSCOPY (EGD);  Surgeon: Winfield Cunas., MD;  Location: [facility];  Service: Endoscopy;  Laterality: N/A;  . ESOPHAGOGASTRODUODENOSCOPY N/A 09/01/2014   Procedure: ESOPHAGOGASTRODUODENOSCOPY (EGD);  Surgeon: Laurence Spates, MD;  Location: [facility];  Service: Endoscopy;  Laterality: N/A;  . KNEE SURGERY    . LAPAROSCOPY    . laproscopic abdominal    . [place] IMPEDANCE STUDY N/A 11/04/2015   Procedure: [place] IMPEDANCE STUDY;  Surgeon: Laurence Spates, MD;  [facility];  Service: Endoscopy;  Laterality: N/A;  . VAGINAL HYSTERECTOMY  2010   TVH--ovaries remain   Social History   Socioeconomic History  . Marital status: Married    Spouse name: Jeneen Rinks  . Number of children: 2  . Years of education: Not on file  . Highest education level: Not on file  Occupational History  . Occupation: Nurse Tourist information centre manager  Tobacco Use  . Smoking status: Never Smoker  . Smokeless tobacco: Never Used  Vaping Use  . Vaping Use: Never used  Substance and Sexual Activity  . Alcohol use: Not Currently  . Drug use: No  . Sexual activity: Yes    Partners: Male    Birth control/protection: None, Surgical  Comment: TVH-ovaries remain  Other Topics Concern  . Not on file  Social History Narrative  . Not on file   Social Determinants of Health   Financial Resource Strain:   . Difficulty of Paying Living Expenses:   Food Insecurity:   . Worried About Charity fundraiser in the Last Year:   . Arboriculturist in the Last Year:   Transportation Needs:   . Film/video editor (Medical):   Marland Kitchen Lack of Transportation (Non-Medical):   Physical Activity:   . Days of Exercise per Week:   . Minutes of Exercise per Session:   Stress:   . Feeling of Stress :   Social Connections:   . Frequency of Communication with  Friends and Family:   . Frequency of Social Gatherings with Friends and Family:   . Attends Religious Services:   . Active Member of Clubs or Organizations:   . Attends Archivist Meetings:   Marland Kitchen Marital Status:    Family History  Problem Relation Age of Onset  . Colon cancer Father 46  . Hypertension Father   . Stroke Father   . Heart disease Father   . Heart attack Father   . Hyperlipidemia Father   . Colon cancer Mother 52  . Diabetes Maternal Uncle   . Stroke Paternal Grandmother   . Heart attack Paternal Grandmother   . Heart attack Maternal Grandmother   . Heart attack Maternal Grandfather   . Cancer Maternal Uncle   . Hypertension Sister   . Diabetes Brother   . Breast cancer Neg Hx    Allergies  Allergen Reactions  . Molds & Smuts Cough    sneezing   Prior to Admission medications   Medication Sig Start Date End Date Taking? Authorizing Provider  albuterol (VENTOLIN HFA) 108 (90 Base) MCG/ACT inhaler Inhale 1 puff into the lungs as needed.   Yes [provider]  clindamycin (CLINDAGEL) 1 % gel Apply 1 application topically as needed.   Yes [provider]  Dapsone 7.5 % GEL Apply 1 application topically as needed.   Yes [provider]  Elderberry 575 MG/5ML SYRP Take by mouth.   Yes [provider]  levocetirizine (XYZAL) 5 MG tablet Take 1 tablet (5 mg total) by mouth every evening. 04/25/17  Yes Bobbitt, Sedalia Muta, MD  Lifitegrast Shirley Friar) 5 % SOLN Apply to eye.   Yes [provider]  Probiotic Product (PROBIOTIC PO) Take 1 capsule by mouth 4 (four) times daily as needed (for GI upset).   Yes [provider]  Vitamin D, Ergocalciferol, (DRISDOL) 1.25 MG (50000 UNIT) CAPS capsule Take 1 capsule (50,000 Units total) by mouth every 7 (seven) days. 04/04/19  Yes Beasley, Caren D, MD     Positive ROS: Otherwise negative  All other systems have been reviewed and were otherwise negative with the exception  of those mentioned in the HPI and as above.  Physical Exam: Constitutional: Alert, well-appearing, no acute distress Ears: External ears without lesions or tenderness. Ear canals are clear bilaterally with intact, clear TMs.  Nasal: External nose without lesions. . Clear nasal passages Oral: Lips and gums without lesions. Tongue and palate mucosa without lesions. Posterior oropharynx clear.  She has small tonsils bilaterally.  No significant abnormality noted on the left tonsil although this is a slightly more prominent than the right tonsil.  On palpation of the tonsil there are no palpable masses. Indirect laryngoscopy revealed a clear base of  tongue vallecula epiglottis hypopharynx was clear.  Voice was normal. Neck: No palpable adenopathy or masses.  No palpable adenopathy in the left neck. Respiratory: Breathing comfortably  Skin: No facial/neck lesions or rash noted.  Procedures  Assessment: Reassured her of normal tonsil examination with no evidence of infection or neoplasm.  Plan: She will follow-up as needed.   Radene Journey, MD

## 2019-06-27 NOTE — Patient Instructions (Addendum)
    Sitting crossed legged   on the floor, propped onto bolster or yoga blocks with knees lower than hips for anterior tilt of pelvis   in the work chair, prop sitting bones on the folded wool / thick blankets for anterior tilt of pelvis   _________________________________________________________________________   Pay attention:  Mini squat with elbows back , shoulder blades back  Knee behind toes

## 2019-06-28 NOTE — Therapy (Signed)
[place] MAIN [facility] SERVICES [address] [place], [place], [address] Phone: [phone]   Fax:  [phone]  Physical Therapy Treatment  Patient Details  Name: Karen [name] MRN: [id number] Date of Birth: 1970-07-21 Referring Provider (PT): Rich    Encounter Date: 06/27/2019   PT End of Session - 06/27/19 1720    Visit Number 5    Number of Visits 10    Date for PT Re-Evaluation 08/08/19    PT Start Time 1703    PT Stop Time 1800    PT Time Calculation (min) 57 min           Past Medical History:  Diagnosis Date  . Adenomyosis   . Anemia   . Arthritis 2013   L knee gets steroid injections   . Asthmatic bronchitis 01/31/2017  . Back pain   . Chronic constipation   . Diarrhea   . DVT of lower extremity (deep venous thrombosis) ([place])   . Dysmenorrhea   . Endometriosis   . Esophageal stricture   . GERD (gastroesophageal reflux disease)   . Hiatal hernia   . History of uterine fibroid   . IBS (irritable bowel syndrome)   . Interstitial cystitis   . Kidney stones   . Lactose intolerance   . Migraine    with aura  . Recurrent upper respiratory infection (URI)   . Sebaceous cyst of breast, right lower inner quadrant 10/25/2012   Excised 11/21/12   . Sleep apnea   . Syncope, non cardiac     Past Surgical History:  Procedure Laterality Date  . ARTHROSCOPIC HAGLUNDS REPAIR    . BREAST CYST EXCISION Right 11/21/2012   Procedure: CYST EXCISION BREAST;  Surgeon: Haywood Lasso, MD;  Location: [facility];  Service: General;  Laterality: Right;  . BREAST EXCISIONAL BIOPSY Right 11/2012  . CESAREAN SECTION  04/19/1993  . CHOLECYSTECTOMY    . COLONOSCOPY  05/02/2011   Procedure: COLONOSCOPY;  Surgeon: Winfield Cunas., MD;  Location: [facility];  Service: Endoscopy;  Laterality: N/A;  . colonoscopy  11/04/2014  . DENTAL SURGERY  03/06/2018   2 surgeries ( 03/06/2018 and 04/06/2018)   to remove two separate  benign tumors  . ESOPHAGEAL MANOMETRY N/A 11/04/2015   Procedure: ESOPHAGEAL MANOMETRY (EM);  Surgeon: Laurence Spates, MD;  [facility];  Service: Endoscopy;  Laterality: N/A;  . ESOPHAGOGASTRODUODENOSCOPY  05/02/2011   Procedure: ESOPHAGOGASTRODUODENOSCOPY (EGD);  Surgeon: Winfield Cunas., MD;  Location: [facility];  Service: Endoscopy;  Laterality: N/A;  . ESOPHAGOGASTRODUODENOSCOPY N/A 09/01/2014   Procedure: ESOPHAGOGASTRODUODENOSCOPY (EGD);  Surgeon: Laurence Spates, MD;  Location: [facility];  Service: Endoscopy;  Laterality: N/A;  . KNEE SURGERY    . LAPAROSCOPY    . laproscopic abdominal    . [place] IMPEDANCE STUDY N/A 11/04/2015   Procedure: [place] IMPEDANCE STUDY;  Surgeon: Laurence Spates, MD;  [facility];  Service: Endoscopy;  Laterality: N/A;  . VAGINAL HYSTERECTOMY  2010   TVH--ovaries remain    There were no vitals filed for this visit.   Subjective Assessment - 06/27/19 1711    Subjective Pt reported she choked on her shake and her thorat feels sore. Pt had daily bowel movements and Type 4 occured across 4 days. There is one exercise with the band and birddog  that irritates her elbow tendonitiis so she is not performing them.  Pt reported her elbow improved 89% after she completed OT. Pt  walk 0.5 miles daily. Pt is making time to sit on the toilet at the same time everytime.    Pertinent History Pt does not have a regular time she typically goes to the bathroom. 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    Patient Stated Goals have a more comfortable pattern              [facility] PT Assessment - 06/28/19 1237      Squat   Comments rounded shoulders, required cues       Other:   Other/ Comments simulated sitting position ( cross l egged) at workstation and on the floor )  , demo'd posterior til tof pelvis, knees higher than hips                           [facility] Adult PT Treatment/Exercise - 06/28/19 1239      Therapeutic Activites    Other Therapeutic Activities ergonomics for sitting at works tation to minimize leg swelling and less psoterior tilt of pelvis with crossed leg sitting on chair and floor       Neuro Re-ed    Neuro Re-ed Details  cued for proper squat and side step technique with less rounded shoulders                        PT Long Term Goals - 06/10/19 0902      PT LONG TERM GOAL #1   Title Pt will maintain regular bowel movements daily or every other day with Type 4 consistency 100% of the time across 2 months in order to minimzie low back pain and needing MagCitrate to clean out her bowels    Time 8    Period Weeks    Status New      PT LONG TERM GOAL #2   Title Pt will demo no coccyx devation and no SIJ hypomobility on L across 2 visits in order to promote better elimination of bowels    Time 4    Period Weeks    Status New    Target Date 07/08/19      PT LONG TERM GOAL #3   Title Pt will report a regular time to sit on the toilet daily across 1 week  to entrain GI system for better bowel eliminations    Time 6    Period Weeks    Status New    Target Date 07/22/19      PT LONG TERM GOAL #4   Title Pt will complete a food diary    Time 4    Period Weeks    Status New    Target Date 07/08/19                 Plan - 06/28/19 1236    Clinical Impression Statement Pt continues to make progress with better bowel movements this week 5/7 days.  Addressed sitting position at workstation and sitting crossed legged on the floor to promote anterior tilt of pelvis which will continue to help further with bowel functions. Cued pt for proper technique to minimize rounded shoulders in squat and side step exercise. Pt continues to benefit from skilled PT    Examination-Activity Limitations Toileting    Rehab Potential Good    PT Frequency 1x / week    PT Duration  Other  (comment)   10   PT Treatment/Interventions Moist Heat;Functional mobility training;Therapeutic activities;Patient/family education;Therapeutic exercise;Manual techniques;Neuromuscular re-education;Gait training    Consulted and Agree with Plan of Care Patient           Patient will benefit from skilled therapeutic intervention in order to improve the following deficits and impairments:  Decreased activity tolerance, Decreased strength, Decreased mobility, Impaired sensation, Postural dysfunction, Hypomobility, Decreased endurance, Decreased coordination, Decreased range of motion, Improper body mechanics, Increased muscle spasms  Visit Diagnosis: Muscle weakness (generalized)  Sacrococcygeal disorders, not elsewhere classified  Other symptoms and signs involving the musculoskeletal system  Other lack of coordination     Problem List Patient Active Problem List   Diagnosis Date Noted  . Obstructive sleep apnea treated with continuous positive airway pressure (CPAP) 06/16/2017  . Perennial allergic rhinitis with a nonallergic component 01/31/2017  . Asthmatic bronchitis 01/31/2017  . GI symptoms 01/31/2017  . Chest pain 12/13/2012  . Dyspnea 12/13/2012    Jerl Mina ,PT, DPT, E-RYT  06/28/2019, 12:42 PM  [place] MAIN [facility] SERVICES [address] [place], [place], [address] Phone: [phone]   Fax:  [phone]  Name: Karen [name] MRN: [id number] Date of Birth: 1970/11/11

## 2019-07-04 ENCOUNTER — Other Ambulatory Visit: Payer: Self-pay

## 2019-07-04 ENCOUNTER — Ambulatory Visit: Payer: 59 | Attending: Gastroenterology | Admitting: Physical Therapy

## 2019-07-04 DIAGNOSIS — M6281 Muscle weakness (generalized): Secondary | ICD-10-CM | POA: Diagnosis not present

## 2019-07-04 DIAGNOSIS — M533 Sacrococcygeal disorders, not elsewhere classified: Secondary | ICD-10-CM | POA: Insufficient documentation

## 2019-07-04 DIAGNOSIS — R278 Other lack of coordination: Secondary | ICD-10-CM | POA: Insufficient documentation

## 2019-07-04 DIAGNOSIS — R29898 Other symptoms and signs involving the musculoskeletal system: Secondary | ICD-10-CM | POA: Diagnosis not present

## 2019-07-04 NOTE — Patient Instructions (Signed)
    At work:  _Doorway squat : bear stretch for shoulder blades  _wall stretch Clock stretch with head turns :  stand perpendicular to the wall, L side to the wall Tilt head to wall  Place L palm at 7 o clock Chin tuck like you are looking into armpit Look at "[place] on giant map  Swivel head with chin tucked to look in upper corner of ceiling as if you are look at  [place] on giant map "   5 reps   Switch direction, R palm on wall at 5 o 'clock   Chin tuck like you are looking into armpit Look at "[place]  on giant map  Swivel head with chin tucked to look in upper corner of ceiling as if you are look at  [facility] on giant map "   5 reps    ___

## 2019-07-05 NOTE — Therapy (Signed)
[place] MAIN [facility] SERVICES [address] [place], [place], [address] Phone: [phone]   Fax:  [phone]  Physical Therapy Treatment  Patient Details  Name: Karen [name] MRN: [id number] Date of Birth: 06-27-1970 Referring Provider (PT): Rich    Encounter Date: 07/04/2019   PT End of Session - 07/04/19 1806    Visit Number 6    Number of Visits 10    Date for PT Re-Evaluation 08/08/19    PT Start Time 1700    PT Stop Time 1800    PT Time Calculation (min) 60 min           Past Medical History:  Diagnosis Date  . Adenomyosis   . Anemia   . Arthritis 2013   L knee gets steroid injections   . Asthmatic bronchitis 01/31/2017  . Back pain   . Chronic constipation   . Diarrhea   . DVT of lower extremity (deep venous thrombosis) ([place])   . Dysmenorrhea   . Endometriosis   . Esophageal stricture   . GERD (gastroesophageal reflux disease)   . Hiatal hernia   . History of uterine fibroid   . IBS (irritable bowel syndrome)   . Interstitial cystitis   . Kidney stones   . Lactose intolerance   . Migraine    with aura  . Recurrent upper respiratory infection (URI)   . Sebaceous cyst of breast, right lower inner quadrant 10/25/2012   Excised 11/21/12   . Sleep apnea   . Syncope, non cardiac     Past Surgical History:  Procedure Laterality Date  . ARTHROSCOPIC HAGLUNDS REPAIR    . BREAST CYST EXCISION Right 11/21/2012   Procedure: CYST EXCISION BREAST;  Surgeon: Haywood Lasso, MD;  Location: [facility];  Service: General;  Laterality: Right;  . BREAST EXCISIONAL BIOPSY Right 11/2012  . CESAREAN SECTION  04/19/1993  . CHOLECYSTECTOMY    . COLONOSCOPY  05/02/2011   Procedure: COLONOSCOPY;  Surgeon: Winfield Cunas., MD;  Location: [facility];  Service: Endoscopy;  Laterality: N/A;  . colonoscopy  11/04/2014  . DENTAL SURGERY  03/06/2018   2 surgeries ( 03/06/2018 and 04/06/2018)   to remove two separate  benign tumors  . ESOPHAGEAL MANOMETRY N/A 11/04/2015   Procedure: ESOPHAGEAL MANOMETRY (EM);  Surgeon: Laurence Spates, MD;  [facility];  Service: Endoscopy;  Laterality: N/A;  . ESOPHAGOGASTRODUODENOSCOPY  05/02/2011   Procedure: ESOPHAGOGASTRODUODENOSCOPY (EGD);  Surgeon: Winfield Cunas., MD;  Location: [facility];  Service: Endoscopy;  Laterality: N/A;  . ESOPHAGOGASTRODUODENOSCOPY N/A 09/01/2014   Procedure: ESOPHAGOGASTRODUODENOSCOPY (EGD);  Surgeon: Laurence Spates, MD;  Location: [facility];  Service: Endoscopy;  Laterality: N/A;  . KNEE SURGERY    . LAPAROSCOPY    . laproscopic abdominal    . [place] IMPEDANCE STUDY N/A 11/04/2015   Procedure: [place] IMPEDANCE STUDY;  Surgeon: Laurence Spates, MD;  [facility];  Service: Endoscopy;  Laterality: N/A;  . VAGINAL HYSTERECTOMY  2010   TVH--ovaries remain    There were no vitals filed for this visit.   Subjective Assessment - 07/04/19 1714    Subjective Pt has had bowel movements across 6 out of 7 days. Pt is walking one mile daily.    Pertinent History Pt does not have a regular time she typically goes to the bathroom. 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    Patient Stated Goals have a more comfortable pattern              [facility] PT Assessment - 07/05/19 1046      Observation/Other Assessments   Observations reviewed happy baby with strap but pt had pain with elbow holding strap       Palpation   Palpation comment increased hypomobility T3-4L, increased L rhomoboids, SCM/ scalenes neck shoulders tightness limiting elbow flexion, tight TMJ L                           [facility] Adult PT Treatment/Exercise - 07/05/19 1047      Neuro Re-ed    Neuro Re-ed Details  cued for neck stretch        Manual Therapy   Manual therapy comments Distraction at T3-4, STM/MWM at rhomobhoids, neck mm , inferior mob of 1st  rib,  SCM/ scalenes, TMJ mm releases L                         PT Long Term Goals - 06/10/19 0902      PT LONG TERM GOAL #1   Title Pt will maintain regular bowel movements daily or every other day with Type 4 consistency 100% of the time across 2 months in order to minimzie low back pain and needing MagCitrate to clean out her bowels    Time 8    Period Weeks    Status New      PT LONG TERM GOAL #2   Title Pt will demo no coccyx devation and no SIJ hypomobility on L across 2 visits in order to promote better elimination of bowels    Time 4    Period Weeks    Status New    Target Date 07/08/19      PT LONG TERM GOAL #3   Title Pt will report a regular time to sit on the toilet daily across 1 week  to entrain GI system for better bowel eliminations    Time 6    Period Weeks    Status New    Target Date 07/22/19      PT LONG TERM GOAL #4   Title Pt will complete a food diary    Time 4    Period Weeks    Status New    Target Date 07/08/19                 Plan - 07/04/19 1758    Clinical Impression Statement Pt continues to have great improvements with bowel movements occurring 6 x across 7 days. Upon reviewing HEP, pt reported elbow pain with holding strap with Happy Baby Pose stretch. Pt has had Hx of L elbow tendinitis and had reported pain in quadriped position exercises at last session and thus this position was withheld from HEP and standing exercises were selected instead as this position was not causing elbow pain. As a barrier to her HEP, assessment of elbow pain was made to find tightness at mm of upper L quadrant, hypomobility at thoracic and cervical spine L > R.  These L sided deficits are associated with pt past surgery to L jaw last year. Suspect that Tx to address L sided restrictions at upper quadrant will help pt be able to perform more HEP with bands and straps with less elbow pain. Pt will continue to benefit from a regional  interdependent  approach to help pt progress towards fitness and strengthening.   Pt continues to benefit from skilled PT   Examination-Activity Limitations Toileting    Rehab Potential Good    PT Frequency 1x / week    PT Duration Other (comment)   10   PT Treatment/Interventions Moist Heat;Functional mobility training;Therapeutic activities;Patient/family education;Therapeutic exercise;Manual techniques;Neuromuscular re-education;Gait training    Consulted and Agree with Plan of Care Patient           Patient will benefit from skilled therapeutic intervention in order to improve the following deficits and impairments:  Decreased activity tolerance, Decreased strength, Decreased mobility, Impaired sensation, Postural dysfunction, Hypomobility, Decreased endurance, Decreased coordination, Decreased range of motion, Improper body mechanics, Increased muscle spasms  Visit Diagnosis: Muscle weakness (generalized)  Sacrococcygeal disorders, not elsewhere classified  Other symptoms and signs involving the musculoskeletal system  Other lack of coordination     Problem List Patient Active Problem List   Diagnosis Date Noted  . Obstructive sleep apnea treated with continuous positive airway pressure (CPAP) 06/16/2017  . Perennial allergic rhinitis with a nonallergic component 01/31/2017  . Asthmatic bronchitis 01/31/2017  . GI symptoms 01/31/2017  . Chest pain 12/13/2012  . Dyspnea 12/13/2012    Jerl Mina ,PT, DPT, E-RYT  07/05/2019, 10:47 AM  [place] MAIN [facility] SERVICES [address] [place], [place], [address] Phone: [phone]   Fax:  [phone]  Name: MEEKA CARTELLI MRN: [id number] Date of Birth: 1970-07-11

## 2019-07-11 ENCOUNTER — Other Ambulatory Visit: Payer: Self-pay

## 2019-07-11 ENCOUNTER — Ambulatory Visit: Payer: 59 | Admitting: Physical Therapy

## 2019-07-11 DIAGNOSIS — M6281 Muscle weakness (generalized): Secondary | ICD-10-CM

## 2019-07-11 DIAGNOSIS — M533 Sacrococcygeal disorders, not elsewhere classified: Secondary | ICD-10-CM | POA: Diagnosis not present

## 2019-07-11 DIAGNOSIS — R29898 Other symptoms and signs involving the musculoskeletal system: Secondary | ICD-10-CM | POA: Diagnosis not present

## 2019-07-11 DIAGNOSIS — R278 Other lack of coordination: Secondary | ICD-10-CM | POA: Diagnosis not present

## 2019-07-11 NOTE — Patient Instructions (Addendum)
Ankle strengthening on L with band band wrapped around outer L side of foot ballmound of L foot pressing onto band , R foot is placed on top of band hip width apart, with the ballmound ,  R hand holds the band 30 reps swinging L pinky toe out to the L  X 2x day  __   Multifidis twist  Band is on doorknob: stand further away from door (facing perpendicular)   Twisting trunk without moving the hips and knees Hold band at the level of ribcage, elbows bent,shoulder blades roll back and down like squeezing a pencil under armpit    Exhale twist,.10-15 deg away from door without moving your hips/ knees. Continue to maintain equal weight through legs. Keep knee unlocked.  10 x 2   ____   Neck / shoulder stretches:   _wall stretch Clock stretch with head turns :  stand perpendicular to the wall, L side to the wall Tilt head to wall  Place L palm at 7 o clock Chin tuck like you are looking into armpit Look at "[place] on giant map  Swivel head with chin tucked to look in upper corner of ceiling as if you are look at  [place] on giant map "   5 reps   Switch direction, R palm on wall at 5 o 'clock   Chin tuck like you are looking into armpit Look at "[place]  on giant map  Swivel head with chin tucked to look in upper corner of ceiling as if you are look at  [facility] on giant map "   5 reps

## 2019-07-11 NOTE — Therapy (Signed)
[place] MAIN [facility] SERVICES [address] [place], [place], [address] Phone: [phone]   Fax:  [phone]  Physical Therapy Treatment  Patient Details  Name: Karen [name] MRN: [id number] Date of Birth: Dec 23, 1970 Referring Provider (PT): Rich    Encounter Date: 07/11/2019   PT End of Session - 07/11/19 1756    Visit Number 7    Number of Visits 10    Date for PT Re-Evaluation 08/08/19    PT Start Time 5176    PT Stop Time 1800    PT Time Calculation (min) 45 min    Activity Tolerance Patient tolerated treatment well    Behavior During Therapy [facility] for tasks assessed/performed           Past Medical History:  Diagnosis Date  . Adenomyosis   . Anemia   . Arthritis 2013   L knee gets steroid injections   . Asthmatic bronchitis 01/31/2017  . Back pain   . Chronic constipation   . Diarrhea   . DVT of lower extremity (deep venous thrombosis) ([place])   . Dysmenorrhea   . Endometriosis   . Esophageal stricture   . GERD (gastroesophageal reflux disease)   . Hiatal hernia   . History of uterine fibroid   . IBS (irritable bowel syndrome)   . Interstitial cystitis   . Kidney stones   . Lactose intolerance   . Migraine    with aura  . Recurrent upper respiratory infection (URI)   . Sebaceous cyst of breast, right lower inner quadrant 10/25/2012   Excised 11/21/12   . Sleep apnea   . Syncope, non cardiac     Past Surgical History:  Procedure Laterality Date  . ARTHROSCOPIC HAGLUNDS REPAIR    . BREAST CYST EXCISION Right 11/21/2012   Procedure: CYST EXCISION BREAST;  Surgeon: Haywood Lasso, MD;  Location: [place];  Service: General;  Laterality: Right;  . BREAST EXCISIONAL BIOPSY Right 11/2012  . CESAREAN SECTION  04/19/1993  . CHOLECYSTECTOMY    . COLONOSCOPY  05/02/2011   Procedure: COLONOSCOPY;  Surgeon: Winfield Cunas., MD;  Location: [facility];  Service: Endoscopy;  Laterality: N/A;  .  colonoscopy  11/04/2014  . DENTAL SURGERY  03/06/2018   2 surgeries ( 03/06/2018 and 04/06/2018)   to remove two separate benign tumors  . ESOPHAGEAL MANOMETRY N/A 11/04/2015   Procedure: ESOPHAGEAL MANOMETRY (EM);  Surgeon: Laurence Spates, MD;  [facility]: [facility];  Service: Endoscopy;  Laterality: N/A;  . ESOPHAGOGASTRODUODENOSCOPY  05/02/2011   Procedure: ESOPHAGOGASTRODUODENOSCOPY (EGD);  Surgeon: Winfield Cunas., MD;  Location: [facility];  Service: Endoscopy;  Laterality: N/A;  . ESOPHAGOGASTRODUODENOSCOPY N/A 09/01/2014   Procedure: ESOPHAGOGASTRODUODENOSCOPY (EGD);  Surgeon: Laurence Spates, MD;  Location: [facility];  Service: Endoscopy;  Laterality: N/A;  . KNEE SURGERY    . LAPAROSCOPY    . laproscopic abdominal    . [place] IMPEDANCE STUDY N/A 11/04/2015   Procedure: [place] IMPEDANCE STUDY;  Surgeon: Laurence Spates, MD;  [facility];  Service: Endoscopy;  Laterality: N/A;  . VAGINAL HYSTERECTOMY  2010   TVH--ovaries remain    There were no vitals filed for this visit.   Subjective Assessment - 07/11/19 1716    Subjective Pt was able to do HEP without elbow pain except for the bands . Pt had bowel movements across 5 /7 days. Today is the first day pt is not able to take her GI medication  Rilistor.  Pertinent History Pt does not have a regular time she typically goes to the bathroom. 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    Patient Stated Goals have a more comfortable pattern              [facility] PT Assessment - 07/11/19 1754      Strength   Overall Strength Comments DF/EV L 3/5 , R 5/5       Palpation   Palpation comment increased L SIJ, tightness at ant tib, hypomobile/ tender at medial knee                          [facility] Adult PT Treatment/Exercise - 07/11/19 1752      Neuro Re-ed    Neuro Re-ed Details  cued for DF/EV on L       Manual Therapy    Manual therapy comments distraction at femoral tib, external rotation glide at medial tib plateau, STM ant tib     AP mob at L SIJ FADDIR                       PT Long Term Goals - 06/10/19 0902      PT LONG TERM GOAL #1   Title Pt will maintain regular bowel movements daily or every other day with Type 4 consistency 100% of the time across 2 months in order to minimzie low back pain and needing MagCitrate to clean out her bowels    Time 8    Period Weeks    Status New      PT LONG TERM GOAL #2   Title Pt will demo no coccyx devation and no SIJ hypomobility on L across 2 visits in order to promote better elimination of bowels    Time 4    Period Weeks    Status New    Target Date 07/08/19      PT LONG TERM GOAL #3   Title Pt will report a regular time to sit on the toilet daily across 1 week  to entrain GI system for better bowel eliminations    Time 6    Period Weeks    Status New    Target Date 07/22/19      PT LONG TERM GOAL #4   Title Pt will complete a food diary    Time 4    Period Weeks    Status New    Target Date 07/08/19                 Plan - 07/11/19 1758    Clinical Impression Statement Pt responded well to last session with no more elbow pain which has allowed for her to resume HEP. Pt demo'd good carry voer with no more L posterior back mm tightness by shoulder and thoracic spine. Pt demo'd tightness at L SIJ and LLE today which is likely associated with her fall onto L side earlier this year. Pt demo'd increased mobility and required strengtehning at L ankle DF/EV which will help with LLE lower kinetic chain deficits for return to fitness activities with less issues. Coccyx is no longer deviated. Multifidis twist with band added to HEP and pt had no c/o elbow pain. Multifidis strengthening will help strengthen her core. Pt continues to benefit from skilled PT.    Examination-Activity Limitations Toileting  Rehab Potential Good    PT  Frequency 1x / week    PT Duration Other (comment)   10   PT Treatment/Interventions Moist Heat;Functional mobility training;Therapeutic activities;Patient/family education;Therapeutic exercise;Manual techniques;Neuromuscular re-education;Gait training    Consulted and Agree with Plan of Care Patient           Patient will benefit from skilled therapeutic intervention in order to improve the following deficits and impairments:  Decreased activity tolerance, Decreased strength, Decreased mobility, Impaired sensation, Postural dysfunction, Hypomobility, Decreased endurance, Decreased coordination, Decreased range of motion, Improper body mechanics, Increased muscle spasms  Visit Diagnosis: Muscle weakness (generalized)  Sacrococcygeal disorders, not elsewhere classified  Other symptoms and signs involving the musculoskeletal system     Problem List Patient Active Problem List   Diagnosis Date Noted  . Obstructive sleep apnea treated with continuous positive airway pressure (CPAP) 06/16/2017  . Perennial allergic rhinitis with a nonallergic component 01/31/2017  . Asthmatic bronchitis 01/31/2017  . GI symptoms 01/31/2017  . Chest pain 12/13/2012  . Dyspnea 12/13/2012    Jerl Mina ,PT, DPT, E-RYT  07/11/2019, 6:01 PM  [place] MAIN [facility] SERVICES [address] [place], [place], [address] Phone: [phone]   Fax:  [phone]  Name: Karen [name] MRN: [id number] Date of Birth: 10-04-1970

## 2019-07-18 ENCOUNTER — Other Ambulatory Visit: Payer: Self-pay

## 2019-07-18 ENCOUNTER — Ambulatory Visit: Payer: 59 | Admitting: Physical Therapy

## 2019-07-18 DIAGNOSIS — R278 Other lack of coordination: Secondary | ICD-10-CM

## 2019-07-18 DIAGNOSIS — M533 Sacrococcygeal disorders, not elsewhere classified: Secondary | ICD-10-CM

## 2019-07-18 DIAGNOSIS — M6281 Muscle weakness (generalized): Secondary | ICD-10-CM | POA: Diagnosis not present

## 2019-07-18 DIAGNOSIS — R29898 Other symptoms and signs involving the musculoskeletal system: Secondary | ICD-10-CM

## 2019-07-19 MED FILL — RELISTOR 150 MG TABLET: 150 | 30 days supply | Qty: 60 | Fill #0

## 2019-07-19 NOTE — Therapy (Signed)
[place] MAIN [facility] SERVICES [address] [place], [place], [address] Phone: [phone]   Fax:  [phone]  Physical Therapy Treatment  Patient Details  Name: Karen [name] MRN: [id number] Date of Birth: 1970-05-09 Referring Provider (PT): Rich    Encounter Date: 07/18/2019   PT End of Session - 07/18/19 1709    Visit Number 8    Number of Visits 10    Date for PT Re-Evaluation 08/08/19    PT Start Time 1027    PT Stop Time 1800    PT Time Calculation (min) 56 min    Activity Tolerance Patient tolerated treatment well    Behavior During Therapy [facility] for tasks assessed/performed           Past Medical History:  Diagnosis Date   Adenomyosis    Anemia    Arthritis 2013   L knee gets steroid injections    Asthmatic bronchitis 01/31/2017   Back pain    Chronic constipation    Diarrhea    DVT of lower extremity (deep venous thrombosis) ([facility])    Dysmenorrhea    Endometriosis    Esophageal stricture    GERD (gastroesophageal reflux disease)    Hiatal hernia    History of uterine fibroid    IBS (irritable bowel syndrome)    Interstitial cystitis    Kidney stones    Lactose intolerance    Migraine    with aura   Recurrent upper respiratory infection (URI)    Sebaceous cyst of breast, right lower inner quadrant 10/25/2012   Excised 11/21/12    Sleep apnea    Syncope, non cardiac     Past Surgical History:  Procedure Laterality Date   ARTHROSCOPIC HAGLUNDS REPAIR     BREAST CYST EXCISION Right 11/21/2012   Procedure: CYST EXCISION BREAST;  Surgeon: Haywood Lasso, MD;  Location: [place];  Service: General;  Laterality: Right;   BREAST EXCISIONAL BIOPSY Right 11/2012   CESAREAN SECTION  04/19/1993   CHOLECYSTECTOMY     COLONOSCOPY  05/02/2011   Procedure: COLONOSCOPY;  Surgeon: Winfield Cunas., MD;  [facility]: [facility];  Service: Endoscopy;  Laterality: N/A;    colonoscopy  11/04/2014   DENTAL SURGERY  03/06/2018   2 surgeries ( 03/06/2018 and 04/06/2018)   to remove two separate benign tumors   ESOPHAGEAL MANOMETRY N/A 11/04/2015   Procedure: ESOPHAGEAL MANOMETRY (EM);  Surgeon: Laurence Spates, MD;  [facility];  Service: Endoscopy;  Laterality: N/A;   ESOPHAGOGASTRODUODENOSCOPY  05/02/2011   Procedure: ESOPHAGOGASTRODUODENOSCOPY (EGD);  Surgeon: Winfield Cunas., MD;  Location: [facility];  Service: Endoscopy;  Laterality: N/A;   ESOPHAGOGASTRODUODENOSCOPY N/A 09/01/2014   Procedure: ESOPHAGOGASTRODUODENOSCOPY (EGD);  Surgeon: Laurence Spates, MD;  Location: [facility];  Service: Endoscopy;  Laterality: N/A;   KNEE SURGERY     LAPAROSCOPY     laproscopic abdominal     [place] IMPEDANCE STUDY N/A 11/04/2015   Procedure: [name] IMPEDANCE STUDY;  Surgeon: Laurence Spates, MD;  [facility];  Service: Endoscopy;  Laterality: N/A;   VAGINAL HYSTERECTOMY  2010   TVH--ovaries remain    There were no vitals filed for this visit.   Subjective Assessment - 07/18/19 1708    Subjective Pt reported the band exercsies did cause more elbow pain and she has stopped them. Pt lowered her seat at work and did not prop up her leg and the L knee swelling is present but the [employer]  is less in the knee.  Pt has not had her bowel medication and her had only 3 BMs this week. Pt continues to walk daily with her daughter 2 miles without knee pain.    Pertinent History Pt does not have a regular time she typically goes to the bathroom. 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    Patient Stated Goals have a more comfortable pattern              [facility] PT Assessment - 07/19/19 1104      Palpation   Palpation comment tightness at  ITBand, quad  L                          [facility] Adult PT Treatment/Exercise - 07/19/19 1103      Neuro Re-ed     Neuro Re-ed Details  cued for stretches, modified positions to minimize elbow flexion which causes pain      Exercises   Exercises --   see pt instructions     Manual Therapy   Manual therapy comments STM/MWM L ITBand, quad                        PT Long Term Goals - 06/10/19 0902      PT LONG TERM GOAL #1   Title Pt will maintain regular bowel movements daily or every other day with Type 4 consistency 100% of the time across 2 months in order to minimzie low back pain and needing MagCitrate to clean out her bowels    Time 8    Period Weeks    Status New      PT LONG TERM GOAL #2   Title Pt will demo no coccyx devation and no SIJ hypomobility on L across 2 visits in order to promote better elimination of bowels    Time 4    Period Weeks    Status New    Target Date 07/08/19      PT LONG TERM GOAL #3   Title Pt will report a regular time to sit on the toilet daily across 1 week  to entrain GI system for better bowel eliminations    Time 6    Period Weeks    Status New    Target Date 07/22/19      PT LONG TERM GOAL #4   Title Pt will complete a food diary    Time 4    Period Weeks    Status New    Target Date 07/08/19                 Plan - 07/19/19 0839    Clinical Impression Statement Pt's bowel movements have decreased due to running out of her bowel medication. Pt is working with MD re: more medication.   Pt required modifications to exercises as strap and resistance band exercises continued to cause elbow pain. Downgraded to scapular stabilization in isometric form before returning to strap/ band exercises. Pt's ITband / quads on L were tight and decreased in tightness post Tx. Plan to progress pt to jogging with safe progressions. Pt continues to benefit from skilled PT    Examination-Activity Limitations Toileting    Rehab Potential Good    PT Frequency 1x / week    PT Duration Other (comment)   10  PT Treatment/Interventions Moist  Heat;Functional mobility training;Therapeutic activities;Patient/family education;Therapeutic exercise;Manual techniques;Neuromuscular re-education;Gait training    Consulted and Agree with Plan of Care Patient           Patient will benefit from skilled therapeutic intervention in order to improve the following deficits and impairments:  Decreased activity tolerance, Decreased strength, Decreased mobility, Impaired sensation, Postural dysfunction, Hypomobility, Decreased endurance, Decreased coordination, Decreased range of motion, Improper body mechanics, Increased muscle spasms  Visit Diagnosis: Muscle weakness (generalized)  Sacrococcygeal disorders, not elsewhere classified  Other symptoms and signs involving the musculoskeletal system  Other lack of coordination     Problem List Patient Active Problem List   Diagnosis Date Noted   Obstructive sleep apnea treated with continuous positive airway pressure (CPAP) 06/16/2017   Perennial allergic rhinitis with a nonallergic component 01/31/2017   Asthmatic bronchitis 01/31/2017   GI symptoms 01/31/2017   Chest pain 12/13/2012   Dyspnea 12/13/2012    Jerl Mina ,PT, DPT, E-RYT  07/19/2019, 11:05 AM  [place] [address] [place], [place], [address] Phone: [phone]   Fax:  [phone]  Name: Karen [name] MRN: [id number] Date of Birth: [birth year]

## 2019-07-19 NOTE — Patient Instructions (Addendum)
    IT band _scoot hips to R, cross R leg over L and straighten knee with strap on [place],    Quad in sidelying _strap around the ankle, pulling ankle towards buttocks  Bottom leg firm and stabilization with knee bent   ___   On your back knees bent  shoulders and chin pressed down 5 sec, 20 reps

## 2019-07-25 ENCOUNTER — Other Ambulatory Visit: Payer: Self-pay

## 2019-07-25 ENCOUNTER — Ambulatory Visit: Payer: 59 | Admitting: Physical Therapy

## 2019-07-25 DIAGNOSIS — M6281 Muscle weakness (generalized): Secondary | ICD-10-CM

## 2019-07-25 DIAGNOSIS — M533 Sacrococcygeal disorders, not elsewhere classified: Secondary | ICD-10-CM

## 2019-07-25 DIAGNOSIS — R278 Other lack of coordination: Secondary | ICD-10-CM

## 2019-07-25 DIAGNOSIS — R29898 Other symptoms and signs involving the musculoskeletal system: Secondary | ICD-10-CM

## 2019-07-25 MED FILL — VIT D2 1.25 MG (50,000 UNIT: 1.25 MG | 28 days supply | Qty: 4 | Fill #3

## 2019-07-25 NOTE — Therapy (Signed)
[place] MAIN [facility] SERVICES [address] [place], [place], [address] Phone: [phone]   Fax:  [phone]  Physical Therapy Treatment  Patient Details  Name: Karen [name] MRN: [id number] Date of Birth: [birth year] Referring Provider (PT): Rich    Encounter Date: 07/25/2019   PT End of Session - 07/25/19 1747    Visit Number 9    Number of Visits 10    Date for PT Re-Evaluation 08/08/19    PT Start Time 1700    PT Stop Time 1755    PT Time Calculation (min) 55 min    Activity Tolerance Patient tolerated treatment well    Behavior During Therapy [facility] for tasks assessed/performed           Past Medical History:  Diagnosis Date  . Adenomyosis   . Anemia   . Arthritis 2013   L knee gets steroid injections   . Asthmatic bronchitis 01/31/2017  . Back pain   . Chronic constipation   . Diarrhea   . DVT of lower extremity (deep venous thrombosis) ([place])   . Dysmenorrhea   . Endometriosis   . Esophageal stricture   . GERD (gastroesophageal reflux disease)   . Hiatal hernia   . History of uterine fibroid   . IBS (irritable bowel syndrome)   . Interstitial cystitis   . Kidney stones   . Lactose intolerance   . Migraine    with aura  . Recurrent upper respiratory infection (URI)   . Sebaceous cyst of breast, right lower inner quadrant 10/25/2012   Excised 11/21/12   . Sleep apnea   . Syncope, non cardiac     Past Surgical History:  Procedure Laterality Date  . ARTHROSCOPIC HAGLUNDS REPAIR    . BREAST CYST EXCISION Right 11/21/2012   Procedure: CYST EXCISION BREAST;  Surgeon: Haywood Lasso, MD;  Location: [place];  Service: General;  Laterality: Right;  . BREAST EXCISIONAL BIOPSY Right 11/2012  . CESAREAN SECTION  04/19/1993  . CHOLECYSTECTOMY    . COLONOSCOPY  05/02/2011   Procedure: COLONOSCOPY;  Surgeon: Winfield Cunas., MD;  Location: [facility];  Service: Endoscopy;  Laterality: N/A;  .  colonoscopy  11/04/2014  . DENTAL SURGERY  03/06/2018   2 surgeries ( 03/06/2018 and 04/06/2018)   to remove two separate benign tumors  . ESOPHAGEAL MANOMETRY N/A 11/04/2015   Procedure: ESOPHAGEAL MANOMETRY (EM);  Surgeon: Laurence Spates, MD;  [facility];  Service: Endoscopy;  Laterality: N/A;  . ESOPHAGOGASTRODUODENOSCOPY  05/02/2011   Procedure: ESOPHAGOGASTRODUODENOSCOPY (EGD);  Surgeon: Winfield Cunas., MD;  Location: [facility];  Service: Endoscopy;  Laterality: N/A;  . ESOPHAGOGASTRODUODENOSCOPY N/A 09/01/2014   Procedure: ESOPHAGOGASTRODUODENOSCOPY (EGD);  Surgeon: Laurence Spates, MD;  Location: [facility];  Service: Endoscopy;  Laterality: N/A;  . KNEE SURGERY    . LAPAROSCOPY    . laproscopic abdominal    . [place] IMPEDANCE STUDY N/A 11/04/2015   Procedure: [place] IMPEDANCE STUDY;  Surgeon: Laurence Spates, MD;  [facility];  Service: Endoscopy;  Laterality: N/A;  . VAGINAL HYSTERECTOMY  2010   TVH--ovaries remain    There were no vitals filed for this visit.   Subjective Assessment - 07/25/19 1704    Subjective Pt reported she still gets swelling in her ankels even with legs not propped up or sitting cross legged in the chair. Pt is back on her bowel medication. Pt continues to walk daily    Pertinent  History Pt does not have a regular time she typically goes to the bathroom. 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    Patient Stated Goals have a more comfortable pattern              [facility] PT Assessment - 07/25/19 1748      Palpation   Palpation comment increased tensions at terest minor, pect and medial scap L                          [facility] Adult PT Treatment/Exercise - 07/25/19 1750      Neuro Re-ed    Neuro Re-ed Details  cued for scapular stabilization new HEP      Modalities   Modalities Moist Heat      Moist Heat Therapy   Number  Minutes Moist Heat 8 Minutes    Moist Heat Location --   throacic      Manual Therapy   Manual therapy comments STM/MWM teres minor, pect and medial scap L                           PT Long Term Goals - 06/10/19 0902      PT LONG TERM GOAL #1   Title Pt will maintain regular bowel movements daily or every other day with Type 4 consistency 100% of the time across 2 months in order to minimzie low back pain and needing MagCitrate to clean out her bowels    Time 8    Period Weeks    Status New      PT LONG TERM GOAL #2   Title Pt will demo no coccyx devation and no SIJ hypomobility on L across 2 visits in order to promote better elimination of bowels    Time 4    Period Weeks    Status New    Target Date 07/08/19      PT LONG TERM GOAL #3   Title Pt will report a regular time to sit on the toilet daily across 1 week  to entrain GI system for better bowel eliminations    Time 6    Period Weeks    Status New    Target Date 07/22/19      PT LONG TERM GOAL #4   Title Pt will complete a food diary    Time 4    Period Weeks    Status New    Target Date 07/08/19                 Plan - 07/25/19 1748    Clinical Impression Statement Pt required significant manual Tx to release tight mm at    teres minor, pect and medial scap L which is like to contributing to her limitations with bending elbow and not able to perform resistance bands. Plan to strengthen scapulothoracic strengthening with isometric strengthening which will help her  Minimize her forward shoulder and plan to return to resistance band exercises when appropriate.  Pt continues to benefit from skilled PT     Examination-Activity Limitations Toileting    Rehab Potential Good    PT Frequency 1x / week    PT Duration Other (comment)   10   PT Treatment/Interventions Moist Heat;Functional mobility training;Therapeutic activities;Patient/family education;Therapeutic exercise;Manual  techniques;Neuromuscular re-education;Gait training    Consulted  and Agree with Plan of Care Patient           Patient will benefit from skilled therapeutic intervention in order to improve the following deficits and impairments:  Decreased activity tolerance, Decreased strength, Decreased mobility, Impaired sensation, Postural dysfunction, Hypomobility, Decreased endurance, Decreased coordination, Decreased range of motion, Improper body mechanics, Increased muscle spasms  Visit Diagnosis: Muscle weakness (generalized)  Sacrococcygeal disorders, not elsewhere classified  Other symptoms and signs involving the musculoskeletal system  Other lack of coordination     Problem List Patient Active Problem List   Diagnosis Date Noted  . Obstructive sleep apnea treated with continuous positive airway pressure (CPAP) 06/16/2017  . Perennial allergic rhinitis with a nonallergic component 01/31/2017  . Asthmatic bronchitis 01/31/2017  . GI symptoms 01/31/2017  . Chest pain 12/13/2012  . Dyspnea 12/13/2012    Jerl Mina 07/25/2019, 5:52 PM  [place] MAIN [facility] SERVICES [address] [place], [place], [address] Phone: [phone]   Fax:  [phone]  Name: Karen [name] MRN: [id number] Date of Birth: 10-09-1970

## 2019-07-25 NOTE — Patient Instructions (Signed)
[place] pose  Pillow under hips if needed for decreased low back pain  Palms face midline by hips  Finger tips shooting straight down  Imagine holding pencil under your armpits Draw shoulders away from ears Inhale Exhale lift chest up slightly without feeling it in your back. The bend happens in the midback  (keep chin tucked)

## 2019-07-29 MED FILL — AMITIZA 24 MCG CAPSULES: 24 | 30 days supply | Qty: 60 | Fill #0 | Status: TO

## 2019-08-01 ENCOUNTER — Other Ambulatory Visit: Payer: Self-pay

## 2019-08-01 ENCOUNTER — Ambulatory Visit: Payer: 59 | Admitting: Physical Therapy

## 2019-08-01 DIAGNOSIS — M533 Sacrococcygeal disorders, not elsewhere classified: Secondary | ICD-10-CM | POA: Diagnosis not present

## 2019-08-01 DIAGNOSIS — M6281 Muscle weakness (generalized): Secondary | ICD-10-CM

## 2019-08-01 DIAGNOSIS — R29898 Other symptoms and signs involving the musculoskeletal system: Secondary | ICD-10-CM

## 2019-08-01 DIAGNOSIS — R278 Other lack of coordination: Secondary | ICD-10-CM | POA: Diagnosis not present

## 2019-08-01 NOTE — Therapy (Addendum)
[place] MAIN [facility] SERVICES [address] [place], [place], [address] Phone: [phone]   Fax:  [phone]  Physical Therapy Treatment / Progress Note   Patient Details  Name: Karen [name] MRN: [id number] Date of Birth: 1970-04-11 Referring Provider (PT): Rich    Encounter Date: 08/01/2019   PT End of Session - 08/01/19 1800    Visit Number 10    Number of Visits 10    Date for PT Re-Evaluation 10/10/19   PN 08/01/19   PT Start Time 1703    PT Stop Time 1804    PT Time Calculation (min) 61 min    Activity Tolerance Patient tolerated treatment well    Behavior During Therapy [facility] for tasks assessed/performed           Past Medical History:  Diagnosis Date   Adenomyosis    Anemia    Arthritis 2013   L knee gets steroid injections    Asthmatic bronchitis 01/31/2017   Back pain    Chronic constipation    Diarrhea    DVT of lower extremity (deep venous thrombosis) (HCC)    Dysmenorrhea    Endometriosis    Esophageal stricture    GERD (gastroesophageal reflux disease)    Hiatal hernia    History of uterine fibroid    IBS (irritable bowel syndrome)    Interstitial cystitis    Kidney stones    Lactose intolerance    Migraine    with aura   Recurrent upper respiratory infection (URI)    Sebaceous cyst of breast, right lower inner quadrant 10/25/2012   Excised 11/21/12    Sleep apnea    Syncope, non cardiac     Past Surgical History:  Procedure Laterality Date   ARTHROSCOPIC HAGLUNDS REPAIR     BREAST CYST EXCISION Right 11/21/2012   Procedure: CYST EXCISION BREAST;  Surgeon: Haywood Lasso, MD;  Location: [place];  Service: General;  Laterality: Right;   BREAST EXCISIONAL BIOPSY Right 11/2012   CESAREAN SECTION  04/19/1993   CHOLECYSTECTOMY     COLONOSCOPY  05/02/2011   Procedure: COLONOSCOPY;  Surgeon: Winfield Cunas., MD;  [facility];  Service:  Endoscopy;  Laterality: N/A;   colonoscopy  11/04/2014   DENTAL SURGERY  03/06/2018   2 surgeries ( 03/06/2018 and 04/06/2018)   to remove two separate benign tumors   ESOPHAGEAL MANOMETRY N/A 11/04/2015   Procedure: ESOPHAGEAL MANOMETRY (EM);  Surgeon: Laurence Spates, MD;  [facility];  Service: Endoscopy;  Laterality: N/A;   ESOPHAGOGASTRODUODENOSCOPY  05/02/2011   Procedure: ESOPHAGOGASTRODUODENOSCOPY (EGD);  Surgeon: Winfield Cunas., MD;  [facility];  Service: Endoscopy;  Laterality: N/A;   ESOPHAGOGASTRODUODENOSCOPY N/A 09/01/2014   Procedure: ESOPHAGOGASTRODUODENOSCOPY (EGD);  Surgeon: Laurence Spates, MD;  Location: [facility];  Service: Endoscopy;  Laterality: N/A;   KNEE SURGERY     LAPAROSCOPY     laproscopic abdominal     PH IMPEDANCE STUDY N/A 11/04/2015   Procedure: [place] IMPEDANCE STUDY;  Surgeon: Laurence Spates, MD;  [facility];  Service: Endoscopy;  Laterality: N/A;   VAGINAL HYSTERECTOMY  2010   TVH--ovaries remain    There were no vitals filed for this visit.   Subjective Assessment - 08/01/19 1708    Subjective Pt reported she has had a bad week with constipation. Pt is still waiting on insurance coverage for her bowel medication. Pt has had a hysterectomy to treat to endometriosis. Pt  has adhesions.    Pertinent History Pt does not have a regular time she typically goes to the bathroom. 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    Patient Stated Goals have a more comfortable pattern              [facility] PT Assessment - 08/01/19 1758      Palpation   Palpation comment fascial restrictions over medial abdomen/ L upper Q, R lower ribs posterior                          [facility] Adult PT Treatment/Exercise - 08/01/19 1758      Therapeutic Activites    Other Therapeutic Activities active listening, questioning, positive  reinforcement with continuation of walking and strengthening exercises      Modalities   Modalities Moist Heat      Moist Heat Therapy   Number Minutes Moist Heat 6 Minutes    Moist Heat Location --   abdomen/ R throacic      Manual Therapy   Manual therapy comments fascial releases/ abdominal gliding/ STM/MWM  medial abdomen/ L upper Q, R lower ribs posterior                        PT Long Term Goals - 08/01/19 1805      PT LONG TERM GOAL #1   Title Pt will maintain regular bowel movements daily or every other day with Type 4 consistency 100% of the time across 2 months in order to minimzie low back pain and needing MagCitrate to clean out her bowels    Time 8    Period Weeks    Status Partially Met      PT LONG TERM GOAL #2   Title Pt will demo no coccyx devation and no SIJ hypomobility on L across 2 visits in order to promote better elimination of bowels    Time 4    Period Weeks    Status Achieved      PT LONG TERM GOAL #3   Title Pt will report a regular time to sit on the toilet daily across 1 week  to entrain GI system for better bowel eliminations    Time 6    Period Weeks    Status Achieved      PT LONG TERM GOAL #4   Title Pt will complete a food diary    Time 4    Period Weeks    Status Achieved      PT LONG TERM GOAL #5   Title Pt will demo decreased abdominal fascial restrictions in order to promote motility    Time 10    Period Weeks    Status New    Target Date 10/10/19      Additional Long Term Goals   Additional Long Term Goals Yes      PT LONG TERM GOAL #6   Title Pt will return to the use to resistance band exercises for fitness and core strengthening    Time 10    Period Weeks    Status New    Target Date 10/10/19                 Plan - 08/01/19 1801    Clinical Impression Statement Pt has achieved 3/6 goals and progressing well towards remaining  goals. Pt has been compliant with drinking warm water first thing in the  morning instead of drinking cold shake which has facilitated more bowel movements. Pt's deep core/ hip / back mm are getting stronger. Pt demo'd improved propioception for anterior pelvic tilt which facilitates bowel movement elimination. Pt is compliant with a regular walking routine will help with motility.  Given pt's Hx of abdominal surgeries, scar adhesions will be addressed at upcoming sessions to promote motility. Pt continues to benefit from skilled PT.    Examination-Activity Limitations Toileting    Rehab Potential Good    PT Frequency 1x / week    PT Duration Other (comment)   10   PT Treatment/Interventions Moist Heat;Functional mobility training;Therapeutic activities;Patient/family education;Therapeutic exercise;Manual techniques;Neuromuscular re-education;Gait training    Consulted and Agree with Plan of Care Patient           Patient will benefit from skilled therapeutic intervention in order to improve the following deficits and impairments:  Decreased activity tolerance, Decreased strength, Decreased mobility, Impaired sensation, Postural dysfunction, Hypomobility, Decreased endurance, Decreased coordination, Decreased range of motion, Improper body mechanics, Increased muscle spasms  Visit Diagnosis: Muscle weakness (generalized)  Sacrococcygeal disorders, not elsewhere classified  Other symptoms and signs involving the musculoskeletal system  Other lack of coordination     Problem List Patient Active Problem List   Diagnosis Date Noted   Obstructive sleep apnea treated with continuous positive airway pressure (CPAP) 06/16/2017   Perennial allergic rhinitis with a nonallergic component 01/31/2017   Asthmatic bronchitis 01/31/2017   GI symptoms 01/31/2017   Chest pain 12/13/2012   Dyspnea 12/13/2012    Jerl Mina ,PT, DPT, E-RYT  08/01/2019, 6:08 PM  [place] MAIN [facility] SERVICES [address]  [place], [place], [address] Phone: [phone]   Fax:  [phone]  Name: Karen [name] MRN: [id number] Date of Birth: 1970-12-30

## 2019-08-01 NOTE — Patient Instructions (Signed)
   Side of hip stretch: ? ?Reclined twist for hips and side of the hips/ legs ? ?Lay on your back, knees bend ?Scoot hips to the R , leave shoulders in place ?Drop knees to the L side ?resting onto pillows to keep leg at the same width of hips ?Pillow under L thigh to minimize too much strain  ? ?

## 2019-08-02 ENCOUNTER — Other Ambulatory Visit: Payer: Self-pay

## 2019-08-02 ENCOUNTER — Ambulatory Visit (HOSPITAL_BASED_OUTPATIENT_CLINIC_OR_DEPARTMENT_OTHER)
Admission: RE | Admit: 2019-08-02 | Discharge: 2019-08-02 | Disposition: A | Discharge status: Home / Self Care | Payer: 59 | Source: Ambulatory Visit | Attending: Internal Medicine | Admitting: Internal Medicine

## 2019-08-02 ENCOUNTER — Other Ambulatory Visit (HOSPITAL_BASED_OUTPATIENT_CLINIC_OR_DEPARTMENT_OTHER): Payer: Self-pay | Admitting: Internal Medicine

## 2019-08-02 DIAGNOSIS — M79605 Pain in left leg: Secondary | ICD-10-CM | POA: Diagnosis not present

## 2019-08-02 DIAGNOSIS — R609 Edema, unspecified: Secondary | ICD-10-CM | POA: Diagnosis not present

## 2019-08-02 DIAGNOSIS — M7989 Other specified soft tissue disorders: Secondary | ICD-10-CM | POA: Diagnosis not present

## 2019-08-02 DIAGNOSIS — K582 Mixed irritable bowel syndrome: Secondary | ICD-10-CM | POA: Diagnosis not present

## 2019-08-02 DIAGNOSIS — Z86718 Personal history of other venous thrombosis and embolism: Secondary | ICD-10-CM | POA: Diagnosis not present

## 2019-08-05 ENCOUNTER — Other Ambulatory Visit (HOSPITAL_COMMUNITY): Payer: Self-pay | Admitting: Gastroenterology

## 2019-08-05 MED FILL — MOTEGRITY 2 MG TABS: 2 | 30 days supply | Qty: 30 | Fill #0

## 2019-08-05 MED FILL — LUBIPROSTONE 24 MCG CAPS: 24 | 30 days supply | Qty: 60 | Fill #0

## 2019-08-07 ENCOUNTER — Other Ambulatory Visit: Payer: Self-pay | Admitting: Allergy and Immunology

## 2019-08-08 ENCOUNTER — Other Ambulatory Visit: Payer: Self-pay

## 2019-08-08 ENCOUNTER — Ambulatory Visit: Payer: 59 | Attending: Gastroenterology | Admitting: Physical Therapy

## 2019-08-08 DIAGNOSIS — R278 Other lack of coordination: Secondary | ICD-10-CM | POA: Diagnosis not present

## 2019-08-08 DIAGNOSIS — M25522 Pain in left elbow: Secondary | ICD-10-CM | POA: Diagnosis not present

## 2019-08-08 DIAGNOSIS — R29898 Other symptoms and signs involving the musculoskeletal system: Secondary | ICD-10-CM | POA: Insufficient documentation

## 2019-08-08 DIAGNOSIS — M6281 Muscle weakness (generalized): Secondary | ICD-10-CM | POA: Insufficient documentation

## 2019-08-08 DIAGNOSIS — M533 Sacrococcygeal disorders, not elsewhere classified: Secondary | ICD-10-CM | POA: Insufficient documentation

## 2019-08-08 NOTE — Therapy (Signed)
[place] MAIN [facility] SERVICES [address] [place], [place], [address] Phone: [phone]   Fax:  [phone]  Physical Therapy Treatment  Patient Details  Name: Karen [name] MRN: [id number] Date of Birth: [birth year] Referring Provider (PT): Rich    Encounter Date: 08/08/2019   PT End of Session - 08/08/19 1703    Visit Number 11    Date for PT Re-Evaluation 10/10/19   PN 08/01/19   PT Start Time 1700    PT Stop Time 1730    PT Time Calculation (min) 30 min    Activity Tolerance Patient tolerated treatment well    Behavior During Therapy [facility] for tasks assessed/performed           Past Medical History:  Diagnosis Date   Adenomyosis    Anemia    Arthritis 2013   L knee gets steroid injections    Asthmatic bronchitis 01/31/2017   Back pain    Chronic constipation    Diarrhea    DVT of lower extremity (deep venous thrombosis) (HCC)    Dysmenorrhea    Endometriosis    Esophageal stricture    GERD (gastroesophageal reflux disease)    Hiatal hernia    History of uterine fibroid    IBS (irritable bowel syndrome)    Interstitial cystitis    Kidney stones    Lactose intolerance    Migraine    with aura   Recurrent upper respiratory infection (URI)    Sebaceous cyst of breast, right lower inner quadrant 10/25/2012   Excised 11/21/12    Sleep apnea    Syncope, non cardiac     Past Surgical History:  Procedure Laterality Date   ARTHROSCOPIC HAGLUNDS REPAIR     BREAST CYST EXCISION Right 11/21/2012   Procedure: CYST EXCISION BREAST;  Surgeon: Haywood Lasso, MD;  Location: [place];  Service: General;  Laterality: Right;   BREAST EXCISIONAL BIOPSY Right 11/2012   CESAREAN SECTION  04/19/1993   CHOLECYSTECTOMY     COLONOSCOPY  05/02/2011   Procedure: COLONOSCOPY;  Surgeon: Winfield Cunas., MD;  [facility];  Service: Endoscopy;  Laterality: N/A;   colonoscopy   11/04/2014   DENTAL SURGERY  03/06/2018   2 surgeries ( 03/06/2018 and 04/06/2018)   to remove two separate benign tumors   ESOPHAGEAL MANOMETRY N/A 11/04/2015   Procedure: ESOPHAGEAL MANOMETRY (EM);  Surgeon: Laurence Spates, MD;  [facility];  Service: Endoscopy;  Laterality: N/A;   ESOPHAGOGASTRODUODENOSCOPY  05/02/2011   Procedure: ESOPHAGOGASTRODUODENOSCOPY (EGD);  Surgeon: Winfield Cunas., MD;  Location: [facility];  Service: Endoscopy;  Laterality: N/A;   ESOPHAGOGASTRODUODENOSCOPY N/A 09/01/2014   Procedure: ESOPHAGOGASTRODUODENOSCOPY (EGD);  Surgeon: Laurence Spates, MD;  Location: [facility];  Service: Endoscopy;  Laterality: N/A;   KNEE SURGERY     LAPAROSCOPY     laproscopic abdominal     PH IMPEDANCE STUDY N/A 11/04/2015   Procedure: [place] IMPEDANCE STUDY;  Surgeon: Laurence Spates, MD;  [facility];  Service: Endoscopy;  Laterality: N/A;   VAGINAL HYSTERECTOMY  2010   TVH--ovaries remain    There were no vitals filed for this visit.   Subjective Assessment - 08/08/19 1702    Subjective Pt reported she was able to go and have BMs a little bit after last session.  She f/u with GI and she did a cleanse because she was constipated. Pt also found out she had a positive D-dimer for a blood  clot but it was not found in the L leg. Pt plans to f/u with her PCP.    Pt had 2 normal type of bowel movements across the past 7 days. Pt's GI doctor is changing her medications to improve her BMs.    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       Pertinent History Pt does not have a regular time she typically goes to the bathroom. 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      Patient Stated Goals have a more comfortable pattern                 PT Long Term Goals - 08/01/19 1805      PT LONG TERM GOAL #1     Title Pt will maintain regular bowel movements daily or every other day with Type 4 consistency 100% of the time across 2 months in order to minimzie low back pain and needing MagCitrate to clean out her bowels    Time 8    Period Weeks    Status Partially Met      PT LONG TERM GOAL #2   Title Pt will demo no coccyx devation and no SIJ hypomobility on L across 2 visits in order to promote better elimination of bowels    Time 4    Period Weeks    Status Achieved      PT LONG TERM GOAL #3   Title Pt will report a regular time to sit on the toilet daily across 1 week  to entrain GI system for better bowel eliminations    Time 6    Period Weeks    Status Achieved      PT LONG TERM GOAL #4   Title Pt will complete a food diary    Time 4    Period Weeks    Status Achieved      PT LONG TERM GOAL #5   Title Pt will demo decreased abdominal fascial restrictions in order to promote motility    Time 10    Period Weeks    Status New    Target Date 10/10/19      Additional Long Term Goals   Additional Long Term Goals Yes      PT LONG TERM GOAL #6   Title Pt will return to the use to resistance band exercises for fitness and core strengthening    Time 10    Period Weeks    Status New    Target Date 10/10/19                 Plan - 08/08/19 1727    Clinical Impression  Today's session involved discussion to withhold PT until pt has undergone further medical work up for blood clot. Currently, pt was informed she had a positive D-Dimer test and she plans to f/u with her PCP for more work up. Remaining PT  treatments for pt includes manual Tx to release abdominal scar adhesions and thus, DPT would like to wait till pt has clearance from her MD. Pt has had Hx of blood clots. Pt was advised to refer back to the protocol she was given when she had blood clots ( i.e. avoid dark leafy greens like spinach and kale to avoid coagulation of blood). Pt was advised to maintain regular  walking and strengthening exercises for blood circulation.    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             Examination-Activity Limitations Toileting    Rehab Potential Good    PT Frequency 1x / week    PT Duration Other (comment)   10   PT Treatment/Interventions Moist Heat;Functional mobility training;Therapeutic activities;Patient/family education;Therapeutic exercise;Manual techniques;Neuromuscular re-education;Gait training    Consulted and Agree with Plan of Care Patient           Patient will benefit from skilled therapeutic intervention in order to improve the following deficits and impairments:  Decreased activity tolerance, Decreased strength, Decreased mobility, Impaired sensation, Postural dysfunction, Hypomobility, Decreased endurance, Decreased coordination, Decreased range of motion, Improper body mechanics, Increased muscle spasms  Visit Diagnosis: Muscle weakness (generalized)  Sacrococcygeal disorders, not elsewhere classified  Other symptoms and signs involving the musculoskeletal system  Other lack of coordination  Pain in left elbow     Problem List Patient Active Problem List   Diagnosis Date Noted   Obstructive sleep apnea treated with continuous positive airway pressure (CPAP) 06/16/2017   Perennial allergic rhinitis with a nonallergic component 01/31/2017   Asthmatic bronchitis 01/31/2017   GI symptoms 01/31/2017   Chest pain 12/13/2012   Dyspnea 12/13/2012    Jerl Mina  ,PT, DPT, E-RYT  08/08/2019, 5:29 PM  [place] MAIN [facility] SERVICES [address] [place], [place], [address] Phone: [phone]   Fax:  [phone]  Name: Karen [name] MRN: [id number] Date of Birth: 07-Oct-1970

## 2019-08-09 MED FILL — XIIDRA 5 % SOLN: 5 | 90 days supply | Qty: 180 | Fill #0

## 2019-08-15 ENCOUNTER — Ambulatory Visit: Payer: 59 | Admitting: Physical Therapy

## 2019-08-15 NOTE — Addendum Note (Signed)
Addended by: Jerl Mina on: 08/15/2019 05:03 PM   Modules accepted: Orders

## 2019-08-20 DIAGNOSIS — K582 Mixed irritable bowel syndrome: Secondary | ICD-10-CM | POA: Diagnosis not present

## 2019-08-20 DIAGNOSIS — Z800 Family history of malignant neoplasm of digestive organs: Secondary | ICD-10-CM | POA: Diagnosis not present

## 2019-09-10 MED FILL — VIT D2 1.25 MG (50,000 UNIT: 1.25 MG | 28 days supply | Qty: 4 | Fill #4

## 2019-09-10 MED FILL — LUBIPROSTONE 24 MCG CAPS: 24 | 30 days supply | Qty: 60 | Fill #1

## 2019-09-25 DIAGNOSIS — K5902 Outlet dysfunction constipation: Secondary | ICD-10-CM | POA: Diagnosis not present

## 2019-09-25 DIAGNOSIS — R0789 Other chest pain: Secondary | ICD-10-CM | POA: Diagnosis not present

## 2019-09-25 DIAGNOSIS — K5901 Slow transit constipation: Secondary | ICD-10-CM | POA: Diagnosis not present

## 2019-09-25 DIAGNOSIS — K929 Disease of digestive system, unspecified: Secondary | ICD-10-CM | POA: Diagnosis not present

## 2019-09-25 DIAGNOSIS — K6389 Other specified diseases of intestine: Secondary | ICD-10-CM | POA: Diagnosis not present

## 2019-09-25 DIAGNOSIS — K219 Gastro-esophageal reflux disease without esophagitis: Secondary | ICD-10-CM | POA: Diagnosis not present

## 2019-09-25 DIAGNOSIS — R109 Unspecified abdominal pain: Secondary | ICD-10-CM | POA: Diagnosis not present

## 2019-09-25 DIAGNOSIS — K449 Diaphragmatic hernia without obstruction or gangrene: Secondary | ICD-10-CM | POA: Diagnosis not present

## 2019-09-25 DIAGNOSIS — K222 Esophageal obstruction: Secondary | ICD-10-CM | POA: Diagnosis not present

## 2019-09-26 ENCOUNTER — Ambulatory Visit: Payer: 59 | Attending: Gastroenterology | Admitting: Physical Therapy

## 2019-09-26 ENCOUNTER — Other Ambulatory Visit: Payer: Self-pay

## 2019-09-26 DIAGNOSIS — R29898 Other symptoms and signs involving the musculoskeletal system: Secondary | ICD-10-CM | POA: Insufficient documentation

## 2019-09-26 DIAGNOSIS — M6281 Muscle weakness (generalized): Secondary | ICD-10-CM | POA: Insufficient documentation

## 2019-09-26 DIAGNOSIS — M533 Sacrococcygeal disorders, not elsewhere classified: Secondary | ICD-10-CM | POA: Insufficient documentation

## 2019-09-26 DIAGNOSIS — R278 Other lack of coordination: Secondary | ICD-10-CM | POA: Diagnosis not present

## 2019-09-26 DIAGNOSIS — M25522 Pain in left elbow: Secondary | ICD-10-CM | POA: Insufficient documentation

## 2019-09-26 NOTE — Therapy (Signed)
[place] MAIN [facility] SERVICES [address] [place], [place], [address] Phone: [phone]   Fax:  [phone]  Physical Therapy Treatment  / Discharge Summary   Patient Details  Name: Karen [name] MRN: [id number] Date of Birth: 20-Oct-1970 Referring Provider (PT): Rich    Encounter Date: 09/26/2019   PT End of Session - 09/26/19 1707    Visit Number 12    Date for PT Re-Evaluation 10/10/19   PN 08/01/19   PT Start Time 1700    PT Stop Time 1720    PT Time Calculation (min) 20 min    Activity Tolerance Patient tolerated treatment well    Behavior During Therapy [facility] for tasks assessed/performed           Past Medical History:  Diagnosis Date   Adenomyosis    Anemia    Arthritis 2013   L knee gets steroid injections    Asthmatic bronchitis 01/31/2017   Back pain    Chronic constipation    Diarrhea    DVT of lower extremity (deep venous thrombosis) (HCC)    Dysmenorrhea    Endometriosis    Esophageal stricture    GERD (gastroesophageal reflux disease)    Hiatal hernia    History of uterine fibroid    IBS (irritable bowel syndrome)    Interstitial cystitis    Kidney stones    Lactose intolerance    Migraine    with aura   Recurrent upper respiratory infection (URI)    Sebaceous cyst of breast, right lower inner quadrant 10/25/2012   Excised 11/21/12    Sleep apnea    Syncope, non cardiac     Past Surgical History:  Procedure Laterality Date   ARTHROSCOPIC HAGLUNDS REPAIR     BREAST CYST EXCISION Right 11/21/2012   Procedure: CYST EXCISION BREAST;  Surgeon: Haywood Lasso, MD;  Location: [place];  Service: General;  Laterality: Right;   BREAST EXCISIONAL BIOPSY Right 11/2012   CESAREAN SECTION  04/19/1993   CHOLECYSTECTOMY     COLONOSCOPY  05/02/2011   Procedure: COLONOSCOPY;  Surgeon: Winfield Cunas., MD;  [facility];  Service: Endoscopy;  Laterality:  N/A;   colonoscopy  11/04/2014   DENTAL SURGERY  03/06/2018   2 surgeries ( 03/06/2018 and 04/06/2018)   to remove two separate benign tumors   ESOPHAGEAL MANOMETRY N/A 11/04/2015   Procedure: ESOPHAGEAL MANOMETRY (EM);  Surgeon: Laurence Spates, MD;  [facility];  Service: Endoscopy;  Laterality: N/A;   ESOPHAGOGASTRODUODENOSCOPY  05/02/2011   Procedure: ESOPHAGOGASTRODUODENOSCOPY (EGD);  Surgeon: Winfield Cunas., MD;  Location: [facility];  Service: Endoscopy;  Laterality: N/A;   ESOPHAGOGASTRODUODENOSCOPY N/A 09/01/2014   Procedure: ESOPHAGOGASTRODUODENOSCOPY (EGD);  Surgeon: Laurence Spates, MD;  Location: [facility];  Service: Endoscopy;  Laterality: N/A;   KNEE SURGERY     LAPAROSCOPY     laproscopic abdominal     PH IMPEDANCE STUDY N/A 11/04/2015   Procedure: [place] IMPEDANCE STUDY;  Surgeon: Laurence Spates, MD;  [facility];  Service: Endoscopy;  Laterality: N/A;   VAGINAL HYSTERECTOMY  2010   TVH--ovaries remain    There were no vitals filed for this visit.   Subjective Assessment - 09/26/19 1707    Subjective Pt reported the pain in the L leg in July 30 got worked out as she found out that she had blood coagulation and not a clot. Pt's bowel medications got worked out. Bowel pattern is much  better.    Pertinent History Pt does not have a regular time she typically goes to the bathroom. 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    Patient Stated Goals have a more comfortable pattern              [facility] PT Assessment - 09/26/19 1721      Palpation   Palpation comment no tightness along thoracic and no scar restrictions over abdomen                          [facility] Adult PT Treatment/Exercise - 09/26/19 1721      Therapeutic Activites    Other Therapeutic Activities reassessed goals                        PT Long Term Goals -  09/26/19 1709      PT LONG TERM GOAL #1   Title Pt will maintain regular bowel movements daily or every other day with Type 4 consistency 100% of the time across 2 months in order to minimzie low back pain and needing MagCitrate to clean out her bowels    Time 8    Period Weeks    Status Achieved      PT LONG TERM GOAL #2   Title Pt will demo no coccyx devation and no SIJ hypomobility on L across 2 visits in order to promote better elimination of bowels    Time 4    Period Weeks    Status Achieved      PT LONG TERM GOAL #3   Title Pt will report a regular time to sit on the toilet daily across 1 week  to entrain GI system for better bowel eliminations    Time 6    Period Weeks    Status Achieved      PT LONG TERM GOAL #4   Title Pt will complete a food diary    Time 4    Period Weeks    Status Achieved      PT LONG TERM GOAL #5   Title Pt will demo decreased abdominal fascial restrictions in order to promote motility    Time 10    Period Weeks    Status Achieved     PT LONG TERM GOAL #6   Title Pt will return to the use resistance band exercises for fitness and core strengthening    Time 10    Period Weeks    Status Achieved                 Plan - 09/26/19 1720    Clinical Impression Statement Pt has 100% of her goals. Pt's FOTO scores for PFDI Urinary score improved from 29 pt to 21 pts, Bowel score 46 pt to 21 pts which indicate signficant improvements. Pt's coccyx alignment, abdominal scar restrictions, deep core strength, spinal mobility,  and posture have improved. Pt is compliant with healthy wholesome eating and bowel medications and has been able to make better formed stools . Pt has incorporated regular walking routine with family. Pt is IND with HEP and is ready for d/c.     Examination-Activity Limitations Toileting    Rehab Potential Good    PT Frequency 1x / week    PT Duration Other (comment)   10   PT Treatment/Interventions  Moist Heat;Functional  mobility training;Therapeutic activities;Patient/family education;Therapeutic exercise;Manual techniques;Neuromuscular re-education;Gait training    Consulted and Agree with Plan of Care Patient           Patient will benefit from skilled therapeutic intervention in order to improve the following deficits and impairments:  Decreased activity tolerance, Decreased strength, Decreased mobility, Impaired sensation, Postural dysfunction, Hypomobility, Decreased endurance, Decreased coordination, Decreased range of motion, Improper body mechanics, Increased muscle spasms  Visit Diagnosis: Muscle weakness (generalized)  Sacrococcygeal disorders, not elsewhere classified  Other symptoms and signs involving the musculoskeletal system  Other lack of coordination  Pain in left elbow     Problem List Patient Active Problem List   Diagnosis Date Noted   Obstructive sleep apnea treated with continuous positive airway pressure (CPAP) 06/16/2017   Perennial allergic rhinitis with a nonallergic component 01/31/2017   Asthmatic bronchitis 01/31/2017   GI symptoms 01/31/2017   Chest pain 12/13/2012   Dyspnea 12/13/2012    Jerl Mina ,PT, DPT, E-RYT  09/26/2019, 5:21 PM  [place] MAIN [facility] SERVICES [address] [place], [place], [address] Phone: [phone]   Fax:  [phone]  Name: Karen [name] MRN: [id number] Date of Birth: [birth year]

## 2019-10-03 ENCOUNTER — Ambulatory Visit: Payer: 59 | Admitting: Physical Therapy

## 2019-10-10 ENCOUNTER — Encounter: Payer: 59 | Admitting: Physical Therapy

## 2019-10-11 ENCOUNTER — Other Ambulatory Visit (HOSPITAL_COMMUNITY): Payer: Self-pay | Admitting: Internal Medicine

## 2019-10-11 MED FILL — VIT D2 1.25 MG (50,000 UNIT: 1.25 MG | 28 days supply | Qty: 4 | Fill #0

## 2019-10-11 MED FILL — LUBIPROSTONE 24 MCG CAPS: 24 | 30 days supply | Qty: 60 | Fill #2

## 2019-10-17 ENCOUNTER — Encounter: Payer: 59 | Admitting: Physical Therapy

## 2019-10-30 ENCOUNTER — Other Ambulatory Visit (HOSPITAL_COMMUNITY): Payer: Self-pay | Admitting: Gastroenterology

## 2019-11-11 MED FILL — LUBIPROSTONE 24 MCG CAPS: 24 | 30 days supply | Qty: 60 | Fill #3

## 2019-11-14 ENCOUNTER — Other Ambulatory Visit: Payer: Self-pay | Admitting: Internal Medicine

## 2019-11-14 DIAGNOSIS — Z1231 Encounter for screening mammogram for malignant neoplasm of breast: Secondary | ICD-10-CM

## 2019-11-16 MED FILL — PEG-3350 SOLUTION: 420 | 1 days supply | Qty: 4000 | Fill #0

## 2019-11-19 DIAGNOSIS — Z1159 Encounter for screening for other viral diseases: Secondary | ICD-10-CM | POA: Diagnosis not present

## 2019-11-22 DIAGNOSIS — Z800 Family history of malignant neoplasm of digestive organs: Secondary | ICD-10-CM | POA: Diagnosis not present

## 2019-11-22 DIAGNOSIS — Z1211 Encounter for screening for malignant neoplasm of colon: Secondary | ICD-10-CM | POA: Diagnosis not present

## 2019-12-09 MED FILL — VIT D2 1.25 MG (50,000 UNIT: 1.25 MG | 84 days supply | Qty: 12 | Fill #1

## 2019-12-09 MED FILL — LUBIPROSTONE 24 MCG CAPS: 24 | 30 days supply | Qty: 60 | Fill #4

## 2019-12-13 ENCOUNTER — Other Ambulatory Visit (HOSPITAL_COMMUNITY): Payer: Self-pay | Admitting: Internal Medicine

## 2019-12-13 DIAGNOSIS — J4520 Mild intermittent asthma, uncomplicated: Secondary | ICD-10-CM | POA: Diagnosis not present

## 2019-12-13 DIAGNOSIS — E669 Obesity, unspecified: Secondary | ICD-10-CM | POA: Diagnosis not present

## 2019-12-13 DIAGNOSIS — Z800 Family history of malignant neoplasm of digestive organs: Secondary | ICD-10-CM | POA: Diagnosis not present

## 2019-12-13 DIAGNOSIS — M6289 Other specified disorders of muscle: Secondary | ICD-10-CM | POA: Diagnosis not present

## 2019-12-13 DIAGNOSIS — K219 Gastro-esophageal reflux disease without esophagitis: Secondary | ICD-10-CM | POA: Diagnosis not present

## 2019-12-13 DIAGNOSIS — K582 Mixed irritable bowel syndrome: Secondary | ICD-10-CM | POA: Diagnosis not present

## 2019-12-13 MED FILL — ALBUTEROL SULFATE HFA 108 (: 108 (90 BAS | 33 days supply | Qty: 18 | Fill #0

## 2019-12-19 DIAGNOSIS — H5213 Myopia, bilateral: Secondary | ICD-10-CM | POA: Diagnosis not present

## 2019-12-25 ENCOUNTER — Ambulatory Visit: Payer: 59

## 2019-12-26 ENCOUNTER — Ambulatory Visit
Admission: RE | Admit: 2019-12-26 | Discharge: 2019-12-26 | Disposition: A | Discharge status: Home / Self Care | Payer: 59 | Source: Ambulatory Visit | Attending: Internal Medicine | Admitting: Internal Medicine

## 2019-12-26 ENCOUNTER — Other Ambulatory Visit: Payer: Self-pay

## 2019-12-26 DIAGNOSIS — Z1231 Encounter for screening mammogram for malignant neoplasm of breast: Secondary | ICD-10-CM

## 2020-01-07 MED FILL — LUBIPROSTONE 24 MCG CAPS: 24 | 30 days supply | Qty: 60 | Fill #5

## 2020-02-05 ENCOUNTER — Other Ambulatory Visit (HOSPITAL_COMMUNITY): Payer: Self-pay | Admitting: Gastroenterology

## 2020-02-05 DIAGNOSIS — R0789 Other chest pain: Secondary | ICD-10-CM | POA: Diagnosis not present

## 2020-02-05 DIAGNOSIS — K6389 Other specified diseases of intestine: Secondary | ICD-10-CM | POA: Diagnosis not present

## 2020-02-05 DIAGNOSIS — K5902 Outlet dysfunction constipation: Secondary | ICD-10-CM | POA: Insufficient documentation

## 2020-02-05 DIAGNOSIS — K219 Gastro-esophageal reflux disease without esophagitis: Secondary | ICD-10-CM | POA: Diagnosis not present

## 2020-02-05 MED FILL — LUBIPROSTONE 24 MCG CAPS: 24 | 30 days supply | Qty: 60 | Fill #0

## 2020-02-26 DIAGNOSIS — G932 Benign intracranial hypertension: Secondary | ICD-10-CM | POA: Diagnosis not present

## 2020-03-02 DIAGNOSIS — G971 Other reaction to spinal and lumbar puncture: Secondary | ICD-10-CM | POA: Diagnosis not present

## 2020-04-13 ENCOUNTER — Other Ambulatory Visit (HOSPITAL_COMMUNITY): Payer: Self-pay

## 2020-04-13 MED FILL — Lubiprostone Cap 24 MCG: ORAL | 30 days supply | Qty: 60 | Fill #0 | Status: AC

## 2020-04-30 ENCOUNTER — Encounter: Payer: Self-pay | Admitting: Registered"

## 2020-04-30 ENCOUNTER — Other Ambulatory Visit: Payer: Self-pay

## 2020-04-30 ENCOUNTER — Encounter: Payer: 59 | Attending: Internal Medicine | Admitting: Registered"

## 2020-04-30 DIAGNOSIS — Z713 Dietary counseling and surveillance: Secondary | ICD-10-CM | POA: Insufficient documentation

## 2020-04-30 NOTE — Progress Notes (Signed)
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 Reports having genetic testing done. 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 States she is trying to stay on top of GI concerns due to this.   States she has come to get some additional information to possibly help establish consistency and find a "happy medium" between diarrhea and constipation. States she wants to know what to eat to maintain nutrition. Reports she has "slow gut". Unsure why. States she wants to know what to eat to keep and maintain consistent bowel flow. Reports she can sometimes have bowel obstruction. Reports she did a cleanse for 2 days over the weekend to help with diarrhea. States she has had diarrhea for the past month and now it is trending more to constipation. States she had pelvic physical therapy which was helpful.   States she has a hiatal hernia and therefore does not consume soy. Drinks almond milk.   States she knows some triggers: salads, raw vegetables, high fructose corn syrup, sweet relish, tomato-based items, and dairy milk.   States her body responds well to: green beans, sauteed spinach, kale, broccoli sometimes, steamable vegetables, LOWFODMAP fruits, hibachi food  States she is intentional about parking far away or take stairs when able to increase movement. Will walk with daughter during the week.   Works 8-5 pm but will have deadlines to meet requiring longer work hours at times.   Pt expectations: other insight or suggestions on what to eat   Clinical Medical Hx: IBS; diagnosed 7-8 years ago Medications: See list Labs: not provided Notable  Signs/Symptoms: diarrhea, constipation  Lifestyle & Dietary Hx  Estimated daily fluid intake: 128+ oz Supplements: See list Sleep: 4-5 hrs/night Stress / self-care: family time, cooking, hanging out with friends, monthly massage, shopping Current average weekly physical activity: walking 3 days/week, 30-45 min and stretching  24-Hr Dietary Recall First Meal: hot tea (48 oz) + 2 scrambled eggs + [place] bacon + [place] sausage + cheese + 1 c berries Snack:  Second Meal: 1 c hibachi shrimp fried rice with zucchini + carrots + Pepsi Snack:  Third Meal: [employer]-salmon cakes + fries + sauteed spinach + few bites of dessert + water + sweet tea Snack:  Beverages: almond milk, water (4-5*33 oz; 132-165 oz), hot tea (48 oz), sweet tea, Pepsi   NUTRITION DIAGNOSIS  NB-1.1 Food and nutrition-related knowledge deficit As related to IBS.  As evidenced by pt verbalized some incomplete knowledge.   NUTRITION INTERVENTION  Nutrition education (E-1) on the following topics: Nutrition education and counseling. Pt was educated and counseled on IBS, nutritional ways to increase fiber/decrease fiber and when to make changes.  Discussed typical day of eating for increased fiber/decreased fiber and what foods to include or limit.   Handouts Provided Include   IBS Nutrition Therapy  Learning Style & Readiness for Change Teaching method utilized: Visual & Auditory  Demonstrated degree of understanding via: Teach Back  Barriers to learning/adherence to lifestyle change: work-life balance  Goals Established by Pt . Use handout as guide to adequately nourish body and see if it helps with establishing stability with bowels.    MONITORING & EVALUATION Dietary intake and  weekly physical activity.  Next Steps  Patient is to follow-up in 1 month.

## 2020-05-18 ENCOUNTER — Other Ambulatory Visit (HOSPITAL_COMMUNITY): Payer: Self-pay

## 2020-05-18 MED FILL — Lubiprostone Cap 24 MCG: ORAL | 30 days supply | Qty: 60 | Fill #1 | Status: AC

## 2020-05-19 ENCOUNTER — Encounter (HOSPITAL_BASED_OUTPATIENT_CLINIC_OR_DEPARTMENT_OTHER): Payer: Self-pay | Admitting: Obstetrics & Gynecology

## 2020-05-19 ENCOUNTER — Other Ambulatory Visit (HOSPITAL_BASED_OUTPATIENT_CLINIC_OR_DEPARTMENT_OTHER)
Admission: RE | Admit: 2020-05-19 | Discharge: 2020-05-19 | Disposition: A | Discharge status: Home / Self Care | Payer: 59 | Source: Ambulatory Visit | Attending: Obstetrics & Gynecology | Admitting: Obstetrics & Gynecology

## 2020-05-19 ENCOUNTER — Other Ambulatory Visit: Payer: Self-pay

## 2020-05-19 ENCOUNTER — Other Ambulatory Visit: Payer: Self-pay | Admitting: Obstetrics & Gynecology

## 2020-05-19 ENCOUNTER — Telehealth (HOSPITAL_BASED_OUTPATIENT_CLINIC_OR_DEPARTMENT_OTHER): Payer: Self-pay | Admitting: Obstetrics & Gynecology

## 2020-05-19 ENCOUNTER — Ambulatory Visit (HOSPITAL_BASED_OUTPATIENT_CLINIC_OR_DEPARTMENT_OTHER): Payer: 59 | Admitting: Obstetrics & Gynecology

## 2020-05-19 VITALS — BP 112/72 | HR 63 | Ht 67.5 in | Wt 210.2 lb

## 2020-05-19 DIAGNOSIS — N644 Mastodynia: Secondary | ICD-10-CM

## 2020-05-19 DIAGNOSIS — N6081 Other benign mammary dysplasias of right breast: Secondary | ICD-10-CM | POA: Diagnosis not present

## 2020-05-19 DIAGNOSIS — N951 Menopausal and female climacteric states: Secondary | ICD-10-CM | POA: Diagnosis not present

## 2020-05-19 DIAGNOSIS — Z8742 Personal history of other diseases of the female genital tract: Secondary | ICD-10-CM

## 2020-05-19 NOTE — Telephone Encounter (Signed)
Called the [facility] while patient was here for visit and scheduled her appointment .Patient has appointment on June 22 2020 240 pm.They will send order over for cosigning  from [place] .

## 2020-05-19 NOTE — Progress Notes (Signed)
GYNECOLOGY  VISIT  CC:   Breast pain  HPI: 50 y.o. G2P2 Married Karen [name] or [place] American female here for breast exam.  Reports she's had some breast discomfort and intermittent pain for the past few months but this past Saturday, she had a massage and it was very painful for her to receive the back massage due to the pain in her breast when had to turn over on stomach/chest.  Pressure caused increased breast pain.  She described the pain more like a burning pain.  Also, she feels her breast are more asymmetric than they used to be.  Last MMG (was a 3D) was 12/2019.  Denies nipple discharge.  Typically drinks two cups of tea a day.  Does not drink coffee or diet sodas, or green tea.  Not sure if menopausal.  Does not have hot flashes.  Had hysterectomy 2010 with Dr. Raphael Gibney due to fibroids and endometriosis.    GYNECOLOGIC HISTORY: No LMP recorded. Patient has had a hysterectomy. Menopausal hormone therapy: none  Patient Active Problem List   Diagnosis Date Noted  . Bacterial overgrowth syndrome 05/21/2020  . Obesity 05/21/2020  . History of endometriosis 05/21/2020  . Constipation due to outlet dysfunction 02/05/2020  . Gastroesophageal reflux disease 02/05/2020  . Obstructive sleep apnea treated with continuous positive airway pressure (CPAP) 06/16/2017  . Perennial allergic rhinitis with a nonallergic component 01/31/2017  . Asthmatic bronchitis 01/31/2017  . GI symptoms 01/31/2017  . Family history of colon cancer 01/27/2015  . Chest pain 12/13/2012  . Dyspnea 12/13/2012  . DVT of lower extremity (deep venous thrombosis) ([place]) 01/03/1990    Past Medical History:  Diagnosis Date  . Adenomyosis   . Anemia   . Arthritis 2013   L knee gets steroid injections   . Asthmatic bronchitis 01/31/2017  . Back pain   . Chronic constipation   . Diarrhea   . DVT of lower extremity (deep venous thrombosis) ([place])   . Dysmenorrhea   . Endometriosis   . Esophageal stricture   . GERD  (gastroesophageal reflux disease)   . Hiatal hernia   . History of uterine fibroid   . IBS (irritable bowel syndrome)   . Interstitial cystitis   . Kidney stones   . Lactose intolerance   . Migraine    with aura  . Recurrent upper respiratory infection (URI)   . Sebaceous cyst of breast, right lower inner quadrant 10/25/2012   Excised 11/21/12   . Sleep apnea   . Syncope, non cardiac     Past Surgical History:  Procedure Laterality Date  . ARTHROSCOPIC HAGLUNDS REPAIR    . BREAST CYST EXCISION Right 11/21/2012   Procedure: CYST EXCISION BREAST;  Surgeon: Haywood Lasso, MD;  Location: [facility];  Service: General;  Laterality: Right;  . BREAST EXCISIONAL BIOPSY Right 11/2012  . CESAREAN SECTION  04/19/1993  . CHOLECYSTECTOMY    . COLONOSCOPY  05/02/2011   Procedure: COLONOSCOPY;  Surgeon: Winfield Cunas., MD;  Location: [facility];  Service: Endoscopy;  Laterality: N/A;  . colonoscopy  11/04/2014  . DENTAL SURGERY  03/06/2018   2 surgeries ( 03/06/2018 and 04/06/2018)   to remove two separate benign tumors  . ESOPHAGEAL MANOMETRY N/A 11/04/2015   Procedure: ESOPHAGEAL MANOMETRY (EM);  Surgeon: Laurence Spates, MD;  Location: [facility];  Service: Endoscopy;  Laterality: N/A;  . ESOPHAGOGASTRODUODENOSCOPY  05/02/2011   Procedure: ESOPHAGOGASTRODUODENOSCOPY (EGD);  Surgeon: Winfield Cunas., MD;  Location: [facility];  Service: Endoscopy;  Laterality: N/A;  . ESOPHAGOGASTRODUODENOSCOPY N/A 09/01/2014   Procedure: ESOPHAGOGASTRODUODENOSCOPY (EGD);  Surgeon: Laurence Spates, MD;  Location: [facility];  Service: Endoscopy;  Laterality: N/A;  . KNEE SURGERY    . LAPAROSCOPY    . laproscopic abdominal    . [place] IMPEDANCE STUDY N/A 11/04/2015   Procedure: [place] IMPEDANCE STUDY;  Surgeon: Laurence Spates, MD;  Location: [facility];  Service: Endoscopy;  Laterality: N/A;  . VAGINAL HYSTERECTOMY  2010   TVH--ovaries remain    MEDS:   Current Outpatient Medications  on File Prior to Visit  Medication Sig Dispense Refill  . albuterol (VENTOLIN HFA) 108 (90 Base) MCG/ACT inhaler Inhale 1 puff into the lungs as needed.    Marland Kitchen albuterol (VENTOLIN HFA) 108 (90 Base) MCG/ACT inhaler INHALE 1 PUFF BY MOUTH EVERY 4-6 HRS 18 g 2  . clindamycin (CLINDAGEL) 1 % gel Apply 1 application topically as needed.    . Dapsone 7.5 % GEL Apply 1 application topically as needed.    Kendall Flack 575 MG/5ML SYRP Take by mouth.    . levocetirizine (XYZAL) 5 MG tablet Take 1 tablet (5 mg total) by mouth every evening. 30 tablet 5  . Lifitegrast 5 % SOLN Apply to eye.    . lubiprostone (AMITIZA) 24 MCG capsule Take 1 capsule (24 mcg total) by mouth 2 (two) times daily with a meal. 60 capsule 5  . Probiotic Product (PROBIOTIC PO) Take 1 capsule by mouth 4 (four) times daily as needed (for GI upset).    . Vitamin D, Ergocalciferol, (DRISDOL) 1.25 MG (50000 UNIT) CAPS capsule Take 1 capsule (50,000 Units total) by mouth every 7 (seven) days. 4 capsule 0  . Vitamin D, Ergocalciferol, (DRISDOL) 1.25 MG (50000 UNIT) CAPS capsule TAKE 1 CAPSULE BY MOUTH ONCE A WEEK WITH DINNER 4 capsule 4  . polyethylene glycol-electrolytes (NULYTELY) 420 g solution USE AS DIRECTED (Patient not taking: Reported on 05/19/2020) 4000 mL 0   No current facility-administered medications on file prior to visit.    ALLERGIES: Molds & smuts  Family History  Problem Relation Age of Onset  . Colon cancer Father 30  . Hypertension Father   . Stroke Father   . Heart disease Father   . Heart attack Father   . Hyperlipidemia Father   . Colon cancer Mother 41  . Diabetes Maternal Uncle   . Stroke Paternal Grandmother   . Heart attack Paternal Grandmother   . Heart attack Maternal Grandmother   . Heart attack Maternal Grandfather   . Cancer Maternal Uncle   . Hypertension Sister   . Diabetes Brother   . Breast cancer Neg Hx     SH:  Married, non smoker  Review of Systems  Constitutional: Negative.      PHYSICAL EXAMINATION:    BP 112/72 (BP Location: Right Arm, Patient Position: Sitting, Cuff Size: Large)   Pulse 63   Ht 5' 7.5" (1.715 m) Comment: reported  Wt 210 lb 3.2 oz (95.3 kg)   BMI 32.44 kg/m     General appearance: alert, cooperative and appears stated age Neck: no adenopathy, supple, symmetrical, trachea midline and thyroid normal to inspection and palpation CV:  Regular rate and rhythm Lungs:  clear to auscultation, no wheezes, rales or rhonchi, symmetric air entry Breasts: normal appearance, diffuse breast pain on right, no discrete mass or nipple discharge on either side, no breast mass on either side Abdomen: soft, non-tender; bowel sounds normal; no masses,  no organomegaly,  does have 3 or 4 small superficial sebaceous cysts but this is not where pain is located Lymph:  No axillary or clavicular adenopathy  Chaperone, Octaviano Batty, CMA, was present for exam.  Assessment/Plan: 1. Breast pain, right - diagnostic right breast MMG with possible ultrasound ordered.  This is not scheduled for 1 month at breast center but this is soonest it could be  2. Perimenopausal symptoms - FSH; Future - if still pre-menopausal, consider short court of progesterone.  Pt with h/o DVT so may not want to proceed with this.  3.  H/o breast sebaceous cysts, superficial

## 2020-05-20 LAB — FOLLICLE STIMULATING HORMONE: FSH: 5.1 m[IU]/mL

## 2020-05-21 DIAGNOSIS — N809 Endometriosis, unspecified: Secondary | ICD-10-CM | POA: Insufficient documentation

## 2020-05-21 DIAGNOSIS — Z8742 Personal history of other diseases of the female genital tract: Secondary | ICD-10-CM | POA: Insufficient documentation

## 2020-05-21 DIAGNOSIS — K6389 Other specified diseases of intestine: Secondary | ICD-10-CM | POA: Insufficient documentation

## 2020-05-21 DIAGNOSIS — E669 Obesity, unspecified: Secondary | ICD-10-CM | POA: Insufficient documentation

## 2020-05-22 ENCOUNTER — Other Ambulatory Visit: Payer: Self-pay

## 2020-05-22 ENCOUNTER — Other Ambulatory Visit: Payer: Self-pay | Admitting: Obstetrics & Gynecology

## 2020-05-22 ENCOUNTER — Ambulatory Visit
Admission: RE | Admit: 2020-05-22 | Discharge: 2020-05-22 | Disposition: A | Discharge status: Home / Self Care | Payer: 59 | Source: Ambulatory Visit | Attending: Obstetrics & Gynecology | Admitting: Obstetrics & Gynecology

## 2020-05-22 ENCOUNTER — Ambulatory Visit: Payer: 59

## 2020-05-22 DIAGNOSIS — N644 Mastodynia: Secondary | ICD-10-CM

## 2020-05-22 DIAGNOSIS — M940 Chondrocostal junction syndrome [Tietze]: Secondary | ICD-10-CM | POA: Diagnosis not present

## 2020-05-22 DIAGNOSIS — N6320 Unspecified lump in the left breast, unspecified quadrant: Secondary | ICD-10-CM

## 2020-05-22 DIAGNOSIS — R928 Other abnormal and inconclusive findings on diagnostic imaging of breast: Secondary | ICD-10-CM | POA: Diagnosis not present

## 2020-05-23 ENCOUNTER — Other Ambulatory Visit (HOSPITAL_COMMUNITY): Payer: Self-pay

## 2020-05-23 MED ORDER — MELOXICAM 15 MG PO TABS
15.0000 mg | ORAL_TABLET | Freq: Every day | ORAL | 0 refills | Status: AC
  Filled 2020-05-23: qty 14, 14d supply, fill #0

## 2020-05-23 MED ORDER — DICLOFENAC SODIUM 1 % EX GEL
2.0000 g | Freq: Four times a day (QID) | CUTANEOUS | 1 refills | Status: DC | PRN
  Filled 2020-05-23: qty 90, 30d supply, fill #0

## 2020-05-25 ENCOUNTER — Telehealth (HOSPITAL_BASED_OUTPATIENT_CLINIC_OR_DEPARTMENT_OTHER): Payer: Self-pay

## 2020-05-25 ENCOUNTER — Other Ambulatory Visit (HOSPITAL_COMMUNITY): Payer: Self-pay

## 2020-05-25 NOTE — Telephone Encounter (Signed)
Entered in Error

## 2020-05-26 ENCOUNTER — Other Ambulatory Visit (HOSPITAL_COMMUNITY): Payer: Self-pay

## 2020-05-26 MED ORDER — DICLOFENAC SODIUM 1 % EX GEL
2.0000 g | Freq: Four times a day (QID) | CUTANEOUS | 1 refills | Status: DC | PRN
  Filled 2020-05-26: qty 100, 13d supply, fill #0

## 2020-05-28 ENCOUNTER — Other Ambulatory Visit: Payer: Self-pay

## 2020-05-28 ENCOUNTER — Encounter: Payer: Self-pay | Admitting: Registered"

## 2020-05-28 ENCOUNTER — Encounter: Payer: 59 | Attending: Internal Medicine | Admitting: Registered"

## 2020-05-28 DIAGNOSIS — Z713 Dietary counseling and surveillance: Secondary | ICD-10-CM | POA: Insufficient documentation

## 2020-05-28 NOTE — Patient Instructions (Signed)
-   Aim to be intentional about eating in calm environments to reduce stress/anxiety.

## 2020-05-28 NOTE — Progress Notes (Signed)
Medical Nutrition Therapy  Appointment Start time:  4:55  Appointment End time:  5:27  Primary concerns today: IBS-mixed  Referral diagnosis: no referral, [facility] employee for benefits Preferred learning style: no preference indicated Learning readiness: ready, change in progress   NUTRITION ASSESSMENT   States she had an esophageal spasm prior to appt today. States she and GI doctor are working to figure out how to treat. States today she was rushing to eat before coming to our appt. Reports she tried to eat steak and cheese sandwich + chips; only had 3 bites.   States she has been having pattern of diarrhea for 3 days and then no diarrhea on the following day. Reports there is no difference in eating on days of no diarrhea.   States she has to consume warm things in the morning to help with digestion; cold things slow down her gut. Reports her goal is to have a comfortable bowel pattern.   Previous visits: Pt arrives stating she has a strong family history of colon cancer. States she has had diarrhea for the past month and now it is trending more to constipation. States she had pelvic physical therapy which was helpful. States she has a hiatal hernia and therefore does not consume soy. Drinks almond milk.   States she knows some triggers: salads, raw vegetables, high fructose corn syrup, sweet relish, tomato-based items, and dairy milk.   States her body responds well to: green beans, sauteed spinach, kale, broccoli sometimes, steamable vegetables, LOWFODMAP fruits, hibachi food  Works 8-5 pm but will have deadlines to meet requiring longer work hours at times.   Pt expectations: other insight or suggestions on what to eat   Clinical Medical Hx: IBS; diagnosed 7-8 years ago Medications: See list Labs: not provided Notable Signs/Symptoms: diarrhea, constipation  Lifestyle & Dietary Hx  Estimated daily fluid intake: 128+ oz Supplements: See list Sleep: 4-5 hrs/night Stress /  self-care: family time, cooking, hanging out with friends, monthly massage, shopping Current average weekly physical activity: walking 3 days/week, 30-45 min and stretching  24-Hr Dietary Recall First Meal: hot tea (48 oz) + 2 scrambled eggs + [place] bacon + 1/2 c [place] sausage + 1 c fruit Snack: vanilla yogurt or [employer]life protein shake Second Meal: 1/2 c hibachi chicken fried rice + 1/2 c fruit Snack: spinach dip + a few crackers or probiotic snack Third Meal: [employer] - 1/2 [place] cheese steak sub + fries + sweet tea Snack: macadamia cookie Beverages: water (5-6*32 oz; 160-192 oz), hot tea with 4 Tbs sugar (48 oz), sweet tea   NUTRITION DIAGNOSIS  NB-1.1 Food and nutrition-related knowledge deficit As related to IBS.  As evidenced by pt verbalized some incomplete knowledge.   NUTRITION INTERVENTION  Nutrition education (E-1) on the following topics: Nutrition education and counseling. Pt was educated and counseled on IBS, nutritional ways to increase fiber/decrease fiber and when to make changes.  Discussed typical day of eating for increased fiber/decreased fiber and what foods to include or limit.   Handouts Provided Include   none  Learning Style & Readiness for Change Teaching method utilized: Visual & Auditory  Demonstrated degree of understanding via: Teach Back  Barriers to learning/adherence to lifestyle change: work-life balance  Goals Established by Pt  Aim to be intentional about eating in calm environments to reduce stress/anxiety.     MONITORING & EVALUATION Dietary intake and weekly physical activity.  Next Steps  Patient is to follow-up in 1 month.

## 2020-06-08 ENCOUNTER — Ambulatory Visit: Payer: 59

## 2020-06-09 ENCOUNTER — Encounter: Payer: Self-pay | Admitting: Registered"

## 2020-06-09 ENCOUNTER — Other Ambulatory Visit: Payer: Self-pay

## 2020-06-09 ENCOUNTER — Encounter: Payer: 59 | Attending: Internal Medicine | Admitting: Registered"

## 2020-06-09 DIAGNOSIS — Z713 Dietary counseling and surveillance: Secondary | ICD-10-CM | POA: Insufficient documentation

## 2020-06-09 NOTE — Progress Notes (Signed)
Medical Nutrition Therapy  Appointment Start time: 12:02  Appointment End time: 12:31  Primary concerns today: IBS-mixed  Referral diagnosis: no referral, [facility] employee for benefits Preferred learning style: no preference indicated Learning readiness: ready, change in progress   NUTRITION ASSESSMENT   States she had diarrhea yesterday. States she has been trying to be more mindful and slow down a bit related to food. States she has had diarrhea about 4x/week since appt 2 weeks ago. States she has been having more back pain lately. States there is no rhyme or reason for diarrhea.   States she does majority of cooking at home.   States she is trying to find balance in meal prep and eating out. States she tries to reduce fat intake as much as possible.   Pt expectations: other insight or suggestions on what to eat   Clinical Medical Hx: IBS; diagnosed 7-8 years ago Medications: See list Labs: not provided Notable Signs/Symptoms: diarrhea, constipation  Lifestyle & Dietary Hx  Estimated daily fluid intake: 128+ oz Supplements: See list Sleep: 4-5 hrs/night Stress / self-care: family time, cooking, hanging out with friends, monthly massage, shopping Current average weekly physical activity: walking 3 days/week, 30-45 min and stretching  24-Hr Dietary Recall First Meal (6 am): hot tea (48 oz) + 2 scrambled eggs + [place] bacon + 1/2 c [place] sausage + 1 c fruit  Snack: vanilla yogurt or Fairlife protein shake Second Meal (1 pm): [employer]-1 c rice (2 Tbs duck sauce) + water Snack: almond milk yogurt + granola + 2 bites of cake Third Meal (8:30 pm): [employer] - burger + cheese (no bun) + fries +  Pepsi  Snack:  Beverages: water (5-6*32 oz; 160-192 oz), hot tea with 4 Tbs sugar (48 oz), sweet tea   NUTRITION DIAGNOSIS  NB-1.1 Food and nutrition-related knowledge deficit As related to IBS.  As evidenced by pt verbalized some incomplete knowledge.   NUTRITION INTERVENTION   Nutrition education (E-1) on the following topics: Nutrition education and counseling. Pt was educated and counseled on possible correlation to eating out and diarrhea. Discussed documenting eating out vs cooking at home/taking lunch from home as it related to diarrhea occurrence.   Handouts Provided Include  none  Learning Style & Readiness for Change Teaching method utilized: Visual & Auditory  Demonstrated degree of understanding via: Teach Back  Barriers to learning/adherence to lifestyle change: work-life balance  Goals Established by Pt Try eating at home/taking lunch more often than eating out for lunch and dinner.    MONITORING & EVALUATION Dietary intake and weekly physical activity.  Next Steps  Patient is to follow-up prn. Pt has completed all required visits for health insurance purposes.

## 2020-06-19 ENCOUNTER — Other Ambulatory Visit (HOSPITAL_COMMUNITY): Payer: Self-pay

## 2020-06-19 DIAGNOSIS — Z0000 Encounter for general adult medical examination without abnormal findings: Secondary | ICD-10-CM | POA: Diagnosis not present

## 2020-06-19 DIAGNOSIS — K219 Gastro-esophageal reflux disease without esophagitis: Secondary | ICD-10-CM | POA: Diagnosis not present

## 2020-06-19 DIAGNOSIS — K5902 Outlet dysfunction constipation: Secondary | ICD-10-CM | POA: Diagnosis not present

## 2020-06-19 DIAGNOSIS — K449 Diaphragmatic hernia without obstruction or gangrene: Secondary | ICD-10-CM | POA: Diagnosis not present

## 2020-06-19 DIAGNOSIS — N200 Calculus of kidney: Secondary | ICD-10-CM | POA: Diagnosis not present

## 2020-06-19 DIAGNOSIS — G4733 Obstructive sleep apnea (adult) (pediatric): Secondary | ICD-10-CM | POA: Diagnosis not present

## 2020-06-19 DIAGNOSIS — M6289 Other specified disorders of muscle: Secondary | ICD-10-CM | POA: Diagnosis not present

## 2020-06-19 DIAGNOSIS — K6389 Other specified diseases of intestine: Secondary | ICD-10-CM | POA: Diagnosis not present

## 2020-06-19 DIAGNOSIS — D7589 Other specified diseases of blood and blood-forming organs: Secondary | ICD-10-CM | POA: Diagnosis not present

## 2020-06-19 DIAGNOSIS — J4520 Mild intermittent asthma, uncomplicated: Secondary | ICD-10-CM | POA: Diagnosis not present

## 2020-06-19 DIAGNOSIS — K929 Disease of digestive system, unspecified: Secondary | ICD-10-CM | POA: Diagnosis not present

## 2020-06-19 DIAGNOSIS — K5901 Slow transit constipation: Secondary | ICD-10-CM | POA: Diagnosis not present

## 2020-06-19 MED FILL — Lubiprostone Cap 24 MCG: ORAL | 30 days supply | Qty: 60 | Fill #2 | Status: AC

## 2020-06-22 ENCOUNTER — Other Ambulatory Visit: Payer: 59

## 2020-07-20 ENCOUNTER — Other Ambulatory Visit (HOSPITAL_COMMUNITY): Payer: Self-pay

## 2020-07-20 MED FILL — Lubiprostone Cap 24 MCG: ORAL | 30 days supply | Qty: 60 | Fill #3 | Status: AC

## 2020-07-24 ENCOUNTER — Other Ambulatory Visit (HOSPITAL_COMMUNITY): Payer: Self-pay

## 2020-08-17 ENCOUNTER — Other Ambulatory Visit (HOSPITAL_COMMUNITY): Payer: Self-pay

## 2020-08-17 DIAGNOSIS — R252 Cramp and spasm: Secondary | ICD-10-CM | POA: Diagnosis not present

## 2020-08-17 DIAGNOSIS — K582 Mixed irritable bowel syndrome: Secondary | ICD-10-CM | POA: Diagnosis not present

## 2020-08-17 MED ORDER — TIZANIDINE HCL 2 MG PO CAPS
2.0000 mg | ORAL_CAPSULE | Freq: Two times a day (BID) | ORAL | 0 refills | Status: DC | PRN
  Filled 2020-08-17: qty 20, 10d supply, fill #0

## 2020-08-20 ENCOUNTER — Other Ambulatory Visit (HOSPITAL_COMMUNITY): Payer: Self-pay

## 2020-08-21 ENCOUNTER — Other Ambulatory Visit (HOSPITAL_COMMUNITY): Payer: Self-pay

## 2020-08-21 MED ORDER — LUBIPROSTONE 24 MCG PO CAPS
24.0000 ug | ORAL_CAPSULE | Freq: Every day | ORAL | 0 refills | Status: DC
  Filled 2020-08-21 – 2021-03-19 (×3): qty 90, 90d supply, fill #0

## 2020-08-21 MED ORDER — LUBIPROSTONE 24 MCG PO CAPS
24.0000 ug | ORAL_CAPSULE | Freq: Two times a day (BID) | ORAL | 5 refills | Status: DC
  Filled 2020-08-21: qty 60, 30d supply, fill #0
  Filled 2020-09-18: qty 60, 30d supply, fill #1
  Filled 2020-10-26: qty 60, 30d supply, fill #2
  Filled 2020-12-03: qty 60, 30d supply, fill #3
  Filled 2021-01-08: qty 60, 30d supply, fill #4
  Filled 2021-02-23: qty 60, 30d supply, fill #5

## 2020-08-24 ENCOUNTER — Other Ambulatory Visit (HOSPITAL_COMMUNITY): Payer: Self-pay

## 2020-09-07 DIAGNOSIS — R059 Cough, unspecified: Secondary | ICD-10-CM | POA: Diagnosis not present

## 2020-09-07 DIAGNOSIS — U071 COVID-19: Secondary | ICD-10-CM | POA: Diagnosis not present

## 2020-09-07 DIAGNOSIS — R5383 Other fatigue: Secondary | ICD-10-CM | POA: Diagnosis not present

## 2020-09-07 DIAGNOSIS — R6889 Other general symptoms and signs: Secondary | ICD-10-CM | POA: Diagnosis not present

## 2020-09-07 DIAGNOSIS — R509 Fever, unspecified: Secondary | ICD-10-CM | POA: Diagnosis not present

## 2020-09-08 ENCOUNTER — Other Ambulatory Visit (HOSPITAL_COMMUNITY): Payer: Self-pay

## 2020-09-08 MED ORDER — BENZONATATE 100 MG PO CAPS
100.0000 mg | ORAL_CAPSULE | Freq: Two times a day (BID) | ORAL | 0 refills | Status: DC | PRN
  Filled 2020-09-08: qty 20, 10d supply, fill #0

## 2020-09-10 ENCOUNTER — Other Ambulatory Visit (HOSPITAL_COMMUNITY): Payer: Self-pay

## 2020-09-10 DIAGNOSIS — U071 COVID-19: Secondary | ICD-10-CM | POA: Diagnosis not present

## 2020-09-10 MED ORDER — PROMETHAZINE-DM 6.25-15 MG/5ML PO SYRP
5.0000 mL | ORAL_SOLUTION | Freq: Every evening | ORAL | 0 refills | Status: DC | PRN
  Filled 2020-09-10: qty 100, 20d supply, fill #0

## 2020-09-11 ENCOUNTER — Other Ambulatory Visit (HOSPITAL_COMMUNITY): Payer: Self-pay

## 2020-09-18 ENCOUNTER — Other Ambulatory Visit (HOSPITAL_COMMUNITY): Payer: Self-pay

## 2020-09-25 ENCOUNTER — Other Ambulatory Visit: Payer: Self-pay

## 2020-09-25 ENCOUNTER — Other Ambulatory Visit: Payer: Self-pay | Admitting: Physician Assistant

## 2020-09-25 ENCOUNTER — Other Ambulatory Visit (HOSPITAL_COMMUNITY): Payer: Self-pay

## 2020-09-25 ENCOUNTER — Ambulatory Visit
Admission: RE | Admit: 2020-09-25 | Discharge: 2020-09-25 | Disposition: A | Discharge status: Home / Self Care | Payer: 59 | Source: Ambulatory Visit | Attending: Physician Assistant | Admitting: Physician Assistant

## 2020-09-25 DIAGNOSIS — K219 Gastro-esophageal reflux disease without esophagitis: Secondary | ICD-10-CM | POA: Diagnosis not present

## 2020-09-25 DIAGNOSIS — R52 Pain, unspecified: Secondary | ICD-10-CM

## 2020-09-25 DIAGNOSIS — R1084 Generalized abdominal pain: Secondary | ICD-10-CM | POA: Diagnosis not present

## 2020-09-25 DIAGNOSIS — K5900 Constipation, unspecified: Secondary | ICD-10-CM | POA: Diagnosis not present

## 2020-09-25 DIAGNOSIS — K582 Mixed irritable bowel syndrome: Secondary | ICD-10-CM | POA: Diagnosis not present

## 2020-09-25 DIAGNOSIS — R109 Unspecified abdominal pain: Secondary | ICD-10-CM | POA: Diagnosis not present

## 2020-09-25 DIAGNOSIS — K224 Dyskinesia of esophagus: Secondary | ICD-10-CM | POA: Diagnosis not present

## 2020-09-25 MED ORDER — RESTORA RX 60-1.25 MG PO CAPS
1.0000 | ORAL_CAPSULE | Freq: Every day | ORAL | 5 refills | Status: DC
  Filled 2020-09-25: qty 30, 30d supply, fill #0
  Filled 2020-11-16 (×2): qty 30, 30d supply, fill #1

## 2020-09-25 MED ORDER — PANTOPRAZOLE SODIUM 20 MG PO TBEC
20.0000 mg | DELAYED_RELEASE_TABLET | Freq: Every day | ORAL | 5 refills | Status: DC
  Filled 2020-09-25: qty 30, 30d supply, fill #0

## 2020-09-25 MED FILL — Albuterol Sulfate Inhal Aero 108 MCG/ACT (90MCG Base Equiv): RESPIRATORY_TRACT | 25 days supply | Qty: 18 | Fill #0 | Status: AC

## 2020-09-28 ENCOUNTER — Other Ambulatory Visit (HOSPITAL_COMMUNITY): Payer: Self-pay

## 2020-09-29 ENCOUNTER — Other Ambulatory Visit (HOSPITAL_COMMUNITY): Payer: Self-pay

## 2020-09-29 ENCOUNTER — Encounter: Payer: Self-pay | Admitting: Allergy & Immunology

## 2020-09-29 ENCOUNTER — Ambulatory Visit: Payer: 59 | Admitting: Allergy & Immunology

## 2020-09-29 ENCOUNTER — Other Ambulatory Visit: Payer: Self-pay

## 2020-09-29 VITALS — BP 138/70 | HR 70 | Temp 97.9°F | Resp 16 | Ht 67.5 in | Wt 207.4 lb

## 2020-09-29 DIAGNOSIS — U071 COVID-19: Secondary | ICD-10-CM

## 2020-09-29 DIAGNOSIS — R197 Diarrhea, unspecified: Secondary | ICD-10-CM | POA: Diagnosis not present

## 2020-09-29 DIAGNOSIS — R0602 Shortness of breath: Secondary | ICD-10-CM

## 2020-09-29 MED ORDER — PEG 3350-KCL-NA BICARB-NACL 420 G PO SOLR
ORAL | 0 refills | Status: DC
  Filled 2020-09-29: qty 4000, 1d supply, fill #0

## 2020-09-29 NOTE — Patient Instructions (Addendum)
1. SOB (shortness of breath) - Lung testing looked slightly low, but it did improve with the albuterol use. - We are going to start Trelegy one puff once daily to see if this helps. - This might just be a temporary thing, but I want to work aggressively to help with you with your breathing.   2. Diarrhea - Continue to follow up with the GI doctor. - None of this seems consistent with food allergies. - Testing to a large number of foods was also negative in January 2019.  - I do not think that repeat testing is needed at this time.  - Previous testing filled out.   3. Chronic rhinitis - with testing positive to indoor molds - We did not do repeat testing since you had testing done in the recent past. - Copy of previous testing provided. - Continue with nasal saline rinses as you are doing. - We could be more aggressive with your nasal sprays, but it seemed that your rhinitis symptoms were improving.  - Call us if this changes.  4. Return in about 2 months (around 11/29/2020).    Please inform us of any Emergency Department visits, hospitalizations, or changes in symptoms. Call us before going to the ED for breathing or allergy symptoms since we might be able to fit you in for a sick visit. Feel free to contact us anytime with any questions, problems, or concerns.  It was a pleasure to meet you today!  Websites that have reliable patient information: 1. American Academy of Asthma, Allergy, and Immunology: www.aaaai.org 2. Food Allergy Research and Education (FARE): foodallergy.org 3. Mothers of Asthmatics: http://www.asthmacommunitynetwork.org 4. American College of Allergy, Asthma, and Immunology: www.acaai.org   COVID-19 Vaccine Information can be found at: ShippingScam.co.uk For questions related to vaccine distribution or appointments, please email [email or web address] or call [phone].   We realize that you might  be concerned about having an allergic reaction to the COVID19 vaccines. To help with that concern, WE ARE OFFERING THE COVID19 VACCINES IN OUR OFFICE! Ask the front desk for dates!     "Like" [place] on Facebook and Instagram for our latest updates!      A healthy democracy works best when [employer] participate! Make sure you are registered to vote! If you have moved or changed any of your contact information, you will need to get this updated before voting!  In some cases, you MAY be able to register to vote online: [email or web address]

## 2020-09-29 NOTE — Progress Notes (Signed)
NEW PATIENT  Date of Service/Encounter:  09/29/20  Consult requested by: Leeroy Cha, MD   Assessment:   SOB (shortness of breath)  Diarrhea - with a history of SIBO s/p treatment by Dr. Jacquiline Doe  COVID-19  GERD - has been on PPIs and H2 blockers in the past  Plan/Recommendations:   1. SOB (shortness of breath) - Lung testing looked slightly low, but it did improve with the albuterol use. - We are going to start Trelegy one puff once daily to see if this helps. - This might just be a temporary thing, but I want to work aggressively to help with you with your breathing.   2. Diarrhea - Continue to follow up with the GI doctor. - None of this seems consistent with food allergies. - Testing to a large number of foods was also negative in January 2019.  - I do not think that repeat testing is needed at this time.  - Previous testing filled out.   3. Chronic rhinitis - with testing positive to indoor molds - We did not do repeat testing since you had testing done in the recent past. - Copy of previous testing provided. - Continue with nasal saline rinses as you are doing. - We could be more aggressive with your nasal sprays, but it seemed that your rhinitis symptoms were improving.  - Call us if this changes.  4. Return in about 2 months (around 11/29/2020).     This note in its entirety was forwarded to the Provider who requested this consultation.  Subjective:   SHANIKQUA ZARZYCKI is a 50 y.o. female presenting today for evaluation of  Chief Complaint  Patient presents with   Cough   Shortness of Breath   Asthma    Machaela SILENA WYSS has a history of the following: Patient Active Problem List   Diagnosis Date Noted   Bacterial overgrowth syndrome 05/21/2020   Obesity 05/21/2020   History of endometriosis 05/21/2020   Constipation due to outlet dysfunction 02/05/2020   Gastroesophageal reflux disease 02/05/2020   Obstructive sleep apnea treated  with continuous positive airway pressure (CPAP) 06/16/2017   Perennial allergic rhinitis with a nonallergic component 01/31/2017   Asthmatic bronchitis 01/31/2017   GI symptoms 01/31/2017   Family history of colon cancer 01/27/2015   Chest pain 12/13/2012   Dyspnea 12/13/2012   DVT of lower extremity (deep venous thrombosis) ([place]) 01/03/1990    History obtained from: chart review and patient.  Serena Colonel was referred by Leeroy Cha, MD.     Xoey is a 50 y.o. female presenting for an evaluation of shortness of breath .  She was actually previously followed by [place].  She was last seen in May 2019 by Dr. Verlin Fester.  At that time, she was continued on Singulair as well as Flovent added during respiratory flares.  For her allergic rhinitis, she was continued on Astelin as well as carbinoxamine.  She had COVID19 at the beginning of September 2022. She started having symptoms on September 4th with fatigue and throat pain. Later in the day., she started running a fever. Everyone in the else starting having a fever as well. She was chasing fevers all weekend and they went to Urgent Care on September 5th and everyone was positive. She did not take any medicaitons at all. Fevers had broken and she was givne a choice to take Paxlovid. She is "sensitive" to medicines anyway. She did take Mucinex as well as saline nasal rinses  and elderberry. She ended up doing a narcotic cough medicine as well for a week and half.    Asthma/Respiratory Symptom History: She was diagnosed with asthma by Dr. Verlin Fester. She had been doing well with her asthma and she estimates that she might have used it once per year. Since having COVID, she has been using her inhaler 3-4 times per day. She has albuterol to use as needed. Per Dr. Brantley Fling last note, she was on Flovent two puffs BID during flares. She no longer has any Flovent available at all.  Allergic Rhinitis Symptom History:   GERD Symptom History: She does  have a diagnosis of reflux which she does not take anything for. She was on Dexilant for a short period of time. Protonix has been discussed but she has not started it.  She has done Pepcid and Prilosec trials.   She has a history of slow gut and IBS. She has been having diarrhea 3-4 times per day since having COVID19. She had an AXR that was consistent with constipation. Decision pending on that.  Otherwise, there is no history of other atopic diseases, including drug allergies, stinging insect allergies, urticaria, or contact dermatitis. There is no significant infectious history. Vaccinations are up to date.    Past Medical History: Patient Active Problem List   Diagnosis Date Noted   Bacterial overgrowth syndrome 05/21/2020   Obesity 05/21/2020   History of endometriosis 05/21/2020   Constipation due to outlet dysfunction 02/05/2020   Gastroesophageal reflux disease 02/05/2020   Obstructive sleep apnea treated with continuous positive airway pressure (CPAP) 06/16/2017   Perennial allergic rhinitis with a nonallergic component 01/31/2017   Asthmatic bronchitis 01/31/2017   GI symptoms 01/31/2017   Family history of colon cancer 01/27/2015   Chest pain 12/13/2012   Dyspnea 12/13/2012   DVT of lower extremity (deep venous thrombosis) ([place]) 01/03/1990    Medication List:  Allergies as of 09/29/2020       Reactions   Molds & Smuts Cough   sneezing        Medication List        Accurate as of September 29, 2020 11:59 PM. If you have any questions, ask your nurse or doctor.          STOP taking these medications    benzonatate 100 MG capsule Commonly known as: TESSALON Stopped by: Valentina Shaggy, MD   diclofenac Sodium 1 % Gel Commonly known as: VOLTAREN Stopped by: Valentina Shaggy, MD   promethazine-dextromethorphan 6.25-15 MG/5ML syrup Commonly known as: PROMETHAZINE-DM Stopped by: Valentina Shaggy, MD   tizanidine 2 MG capsule Commonly known  as: ZANAFLEX Stopped by: Valentina Shaggy, MD       TAKE these medications    albuterol 108 (90 Base) MCG/ACT inhaler Commonly known as: VENTOLIN HFA INHALE 1 PUFF BY MOUTH EVERY 4-6 HRS What changed: Another medication with the same name was removed. Continue taking this medication, and follow the directions you see here. Changed by: Valentina Shaggy, MD   clindamycin 1 % gel Commonly known as: CLINDAGEL Apply 1 application topically as needed.   Dapsone 7.5 % Gel Apply 1 application topically as needed.   Elderberry 575 MG/5ML Syrp Take by mouth.   levocetirizine 5 MG tablet Commonly known as: XYZAL Take 1 tablet (5 mg total) by mouth every evening.   Lifitegrast 5 % Soln Apply to eye.   lubiprostone 24 MCG capsule Commonly known as: Amitiza Take 1 capsule (24 mcg total)  by mouth daily with food and water.   lubiprostone 24 MCG capsule Commonly known as: AMITIZA Take 1 capsule (24 mcg total) by mouth 2 (two) times daily with a meal   pantoprazole 20 MG tablet Commonly known as: PROTONIX Take 1 tablet (20 mg total) by mouth daily.   polyethylene glycol-electrolytes 420 g solution Commonly known as: NuLYTELY Take as directed for constipation What changed: when to take this   PROBIOTIC PO Take 1 capsule by mouth 4 (four) times daily as needed (for GI upset).   Restora RX 60-1.25 MG Caps Generic drug: Lactobacillus Casei-Folic Acid Take 1 capsule by mouth daily between meals   Vitamin D (Ergocalciferol) 1.25 MG (50000 UNIT) Caps capsule Commonly known as: DRISDOL TAKE 1 CAPSULE BY MOUTH ONCE A WEEK WITH DINNER What changed: Another medication with the same name was removed. Continue taking this medication, and follow the directions you see here. Changed by: Valentina Shaggy, MD        Birth History: non-contributory  Developmental History: non-contributory  Past Surgical History: Past Surgical History:  Procedure Laterality Date    ARTHROSCOPIC HAGLUNDS REPAIR     BREAST CYST EXCISION Right 11/21/2012   Procedure: CYST EXCISION BREAST;  Surgeon: Haywood Lasso, MD;  Location: [facility];  Service: General;  Laterality: Right;   BREAST EXCISIONAL BIOPSY Right 11/2012   CESAREAN SECTION  04/19/1993   CHOLECYSTECTOMY     COLONOSCOPY  05/02/2011   Procedure: COLONOSCOPY;  Surgeon: Winfield Cunas., MD;  Location: [facility];  Service: Endoscopy;  Laterality: N/A;   colonoscopy  11/04/2014   DENTAL SURGERY  03/06/2018   2 surgeries ( 03/06/2018 and 04/06/2018)   to remove two separate benign tumors   ESOPHAGEAL MANOMETRY N/A 11/04/2015   Procedure: ESOPHAGEAL MANOMETRY (EM);  Surgeon: Laurence Spates, MD;  Location: [facility];  Service: Endoscopy;  Laterality: N/A;   ESOPHAGOGASTRODUODENOSCOPY  05/02/2011   Procedure: ESOPHAGOGASTRODUODENOSCOPY (EGD);  Surgeon: Winfield Cunas., MD;  Location: [facility];  Service: Endoscopy;  Laterality: N/A;   ESOPHAGOGASTRODUODENOSCOPY N/A 09/01/2014   Procedure: ESOPHAGOGASTRODUODENOSCOPY (EGD);  Surgeon: Laurence Spates, MD;  Location: [facility];  Service: Endoscopy;  Laterality: N/A;   KNEE SURGERY     LAPAROSCOPY     laproscopic abdominal     PH IMPEDANCE STUDY N/A 11/04/2015   Procedure: [place] IMPEDANCE STUDY;  Surgeon: Laurence Spates, MD;  Location: [facility];  Service: Endoscopy;  Laterality: N/A;   VAGINAL HYSTERECTOMY  2010   TVH--ovaries remain     Family History: Family History  Problem Relation Age of Onset   Colon cancer Father 52   Hypertension Father    Stroke Father    Heart disease Father    Heart attack Father    Hyperlipidemia Father    Colon cancer Mother 8   Diabetes Maternal Uncle    Stroke Paternal Grandmother    Heart attack Paternal Grandmother    Heart attack Maternal Grandmother    Heart attack Maternal Grandfather    Cancer Maternal Uncle    Hypertension Sister    Diabetes Brother    Breast cancer Neg Hx       Social History: Kamani lives at home with her family.  They live in a house that is 52 years old.  There is no mildew damage.  They have carpeting throughout the home.  Apparently complicating and central cooling.  There are no animals inside or outside of the home.  There are dust  mite covers on the bedding.  There is no tobacco exposure.  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  She has done this for 1 year, but she has been in the [facility] health system for 10 years.  They do use a HEPA filter in the home.  There is no tobacco exposure.   Review of Systems  Constitutional: Negative.  Negative for chills, fever, malaise/fatigue and weight loss.  HENT: Negative.  Negative for congestion, ear discharge, ear pain and sinus pain.   Eyes:  Negative for pain, discharge and redness.  Respiratory:  Positive for cough and shortness of breath. Negative for sputum production and wheezing.   Cardiovascular: Negative.  Negative for chest pain and palpitations.  Gastrointestinal:  Negative for abdominal pain, constipation, diarrhea, heartburn, nausea and vomiting.  Skin: Negative.  Negative for itching and rash.  Neurological:  Negative for dizziness and headaches.  Endo/Heme/Allergies:  Negative for environmental allergies. Does not bruise/bleed easily.      Objective:   Blood pressure 138/70, pulse 70, temperature 97.9 F (36.6 C), temperature source Temporal, resp. rate 16, height 5' 7.5" (1.715 m), weight 207 lb 6.4 oz (94.1 kg), SpO2 98 %. Body mass index is 32 kg/m.   Physical Exam:   Physical Exam Vitals reviewed.  Constitutional:      Appearance: She is well-developed.  HENT:     Head: Normocephalic and atraumatic.     Right Ear: Tympanic membrane, ear canal and external ear normal. No drainage, swelling or tenderness. Tympanic membrane is not injected, scarred, erythematous, retracted or bulging.     Left Ear: Tympanic membrane, ear canal  and external ear normal. No drainage, swelling or tenderness. Tympanic membrane is not injected, scarred, erythematous, retracted or bulging.     Nose: No nasal deformity, septal deviation, mucosal edema or rhinorrhea.     Right Turbinates: Enlarged and swollen.     Left Turbinates: Enlarged and swollen.     Right Sinus: No maxillary sinus tenderness or frontal sinus tenderness.     Left Sinus: No maxillary sinus tenderness or frontal sinus tenderness.     Mouth/Throat:     Mouth: Mucous membranes are not pale and not dry.     Pharynx: Uvula midline.  Eyes:     General: Allergic shiner present.        Right eye: No discharge.        Left eye: No discharge.     Conjunctiva/sclera: Conjunctivae normal.     Right eye: Right conjunctiva is not injected. No chemosis.    Left eye: Left conjunctiva is not injected. No chemosis.    Pupils: Pupils are equal, round, and reactive to light.  Cardiovascular:     Rate and Rhythm: Normal rate and regular rhythm.     Heart sounds: Normal heart sounds.  Pulmonary:     Effort: Pulmonary effort is normal. No tachypnea, accessory muscle usage or respiratory distress.     Breath sounds: Normal breath sounds. No wheezing, rhonchi or rales.  Chest:     Chest wall: No tenderness.  Abdominal:     Tenderness: There is no abdominal tenderness. There is no guarding or rebound.  Lymphadenopathy:     Head:     Right side of head: No submandibular, tonsillar or occipital adenopathy.     Left side of head: No submandibular, tonsillar or occipital adenopathy.     Cervical: No cervical adenopathy.  Skin:    Coloration:  Skin is not pale.     Findings: No abrasion, erythema, petechiae or rash. Rash is not papular, urticarial or vesicular.  Neurological:     Mental Status: She is alert.  Psychiatric:        Behavior: Behavior is cooperative.     Diagnostic studies:  Spirometry: Normal FEV1, FVC, and FEV1/FVC ratio. There is no scooping suggestive of  obstructive disease.  Albuterol four puffs via MDI treatment given in clinic with improvement in FEV1 and FVC, but not significant per ATS criteria.  Allergy Studies: none     Salvatore Marvel, MD [facility]

## 2020-09-30 ENCOUNTER — Other Ambulatory Visit (HOSPITAL_COMMUNITY): Payer: Self-pay

## 2020-09-30 ENCOUNTER — Encounter: Payer: Self-pay | Admitting: Allergy & Immunology

## 2020-10-01 ENCOUNTER — Other Ambulatory Visit (HOSPITAL_COMMUNITY): Payer: Self-pay

## 2020-10-01 NOTE — Addendum Note (Signed)
Addended by: Jacqualin Combes on: 10/01/2020 05:20 PM   Modules accepted: Orders

## 2020-10-15 ENCOUNTER — Ambulatory Visit: Payer: 59 | Admitting: Allergy

## 2020-10-20 ENCOUNTER — Ambulatory Visit: Payer: 59 | Admitting: Allergy

## 2020-10-26 ENCOUNTER — Other Ambulatory Visit (HOSPITAL_COMMUNITY): Payer: Self-pay

## 2020-10-27 ENCOUNTER — Other Ambulatory Visit: Payer: Self-pay | Admitting: Physician Assistant

## 2020-10-27 DIAGNOSIS — K5900 Constipation, unspecified: Secondary | ICD-10-CM | POA: Diagnosis not present

## 2020-10-27 DIAGNOSIS — R140 Abdominal distension (gaseous): Secondary | ICD-10-CM | POA: Diagnosis not present

## 2020-10-27 DIAGNOSIS — R1084 Generalized abdominal pain: Secondary | ICD-10-CM | POA: Diagnosis not present

## 2020-10-27 DIAGNOSIS — K582 Mixed irritable bowel syndrome: Secondary | ICD-10-CM | POA: Diagnosis not present

## 2020-10-28 DIAGNOSIS — K5901 Slow transit constipation: Secondary | ICD-10-CM | POA: Diagnosis not present

## 2020-10-28 DIAGNOSIS — R1084 Generalized abdominal pain: Secondary | ICD-10-CM | POA: Diagnosis not present

## 2020-10-28 DIAGNOSIS — R110 Nausea: Secondary | ICD-10-CM | POA: Diagnosis not present

## 2020-10-29 DIAGNOSIS — R197 Diarrhea, unspecified: Secondary | ICD-10-CM | POA: Diagnosis not present

## 2020-10-29 DIAGNOSIS — K6389 Other specified diseases of intestine: Secondary | ICD-10-CM | POA: Diagnosis not present

## 2020-10-29 DIAGNOSIS — K5900 Constipation, unspecified: Secondary | ICD-10-CM | POA: Diagnosis not present

## 2020-10-29 DIAGNOSIS — R109 Unspecified abdominal pain: Secondary | ICD-10-CM | POA: Diagnosis not present

## 2020-10-30 ENCOUNTER — Other Ambulatory Visit (HOSPITAL_COMMUNITY): Payer: Self-pay

## 2020-11-04 ENCOUNTER — Other Ambulatory Visit: Payer: Self-pay

## 2020-11-04 ENCOUNTER — Ambulatory Visit
Admission: RE | Admit: 2020-11-04 | Discharge: 2020-11-04 | Disposition: A | Discharge status: Home / Self Care | Payer: 59 | Source: Ambulatory Visit | Attending: Physician Assistant | Admitting: Physician Assistant

## 2020-11-04 DIAGNOSIS — R1084 Generalized abdominal pain: Secondary | ICD-10-CM | POA: Diagnosis not present

## 2020-11-04 DIAGNOSIS — N200 Calculus of kidney: Secondary | ICD-10-CM | POA: Diagnosis not present

## 2020-11-04 MED ORDER — IOPAMIDOL (ISOVUE-300) INJECTION 61%
100.0000 mL | Freq: Once | INTRAVENOUS | Status: AC | PRN
  Administered 2020-11-04: 100 mL via INTRAVENOUS

## 2020-11-06 ENCOUNTER — Other Ambulatory Visit (HOSPITAL_COMMUNITY): Payer: Self-pay

## 2020-11-11 DIAGNOSIS — K5900 Constipation, unspecified: Secondary | ICD-10-CM | POA: Diagnosis not present

## 2020-11-11 DIAGNOSIS — R142 Eructation: Secondary | ICD-10-CM | POA: Diagnosis not present

## 2020-11-11 DIAGNOSIS — R109 Unspecified abdominal pain: Secondary | ICD-10-CM | POA: Diagnosis not present

## 2020-11-11 DIAGNOSIS — R197 Diarrhea, unspecified: Secondary | ICD-10-CM | POA: Diagnosis not present

## 2020-11-11 DIAGNOSIS — R634 Abnormal weight loss: Secondary | ICD-10-CM | POA: Diagnosis not present

## 2020-11-11 DIAGNOSIS — R1084 Generalized abdominal pain: Secondary | ICD-10-CM | POA: Diagnosis not present

## 2020-11-11 DIAGNOSIS — R140 Abdominal distension (gaseous): Secondary | ICD-10-CM | POA: Diagnosis not present

## 2020-11-11 DIAGNOSIS — R110 Nausea: Secondary | ICD-10-CM | POA: Diagnosis not present

## 2020-11-13 ENCOUNTER — Other Ambulatory Visit: Payer: 59

## 2020-11-16 ENCOUNTER — Other Ambulatory Visit (HOSPITAL_COMMUNITY): Payer: Self-pay

## 2020-11-16 MED FILL — Albuterol Sulfate Inhal Aero 108 MCG/ACT (90MCG Base Equiv): RESPIRATORY_TRACT | 25 days supply | Qty: 18 | Fill #1 | Status: AC

## 2020-11-19 ENCOUNTER — Other Ambulatory Visit (HOSPITAL_COMMUNITY): Payer: Self-pay

## 2020-12-01 ENCOUNTER — Ambulatory Visit: Payer: 59 | Admitting: Allergy & Immunology

## 2020-12-03 ENCOUNTER — Other Ambulatory Visit (HOSPITAL_COMMUNITY): Payer: Self-pay

## 2020-12-08 ENCOUNTER — Other Ambulatory Visit: Payer: Self-pay

## 2020-12-08 ENCOUNTER — Encounter: Payer: Self-pay | Admitting: Allergy & Immunology

## 2020-12-08 ENCOUNTER — Ambulatory Visit: Payer: 59 | Admitting: Allergy & Immunology

## 2020-12-08 VITALS — BP 120/80 | HR 87 | Temp 98.3°F | Resp 16 | Ht 67.0 in | Wt 202.8 lb

## 2020-12-08 DIAGNOSIS — J3089 Other allergic rhinitis: Secondary | ICD-10-CM | POA: Diagnosis not present

## 2020-12-08 DIAGNOSIS — R0602 Shortness of breath: Secondary | ICD-10-CM

## 2020-12-08 DIAGNOSIS — R1310 Dysphagia, unspecified: Secondary | ICD-10-CM | POA: Diagnosis not present

## 2020-12-08 DIAGNOSIS — R197 Diarrhea, unspecified: Secondary | ICD-10-CM

## 2020-12-08 NOTE — Patient Instructions (Addendum)
1. SOB (shortness of breath) - Lung testing looked a little worse, but really it looks fairly good. - This is the least of your worries right now.   2. Dysphagia - I might consider an endoscopy but we will go ahead and refer you to ENT for evaluation of your airway. - They can manage your neck cyst as well.   3. Return in about 6 months (around 06/08/2021).    Please inform us of any Emergency Department visits, hospitalizations, or changes in symptoms. Call us before going to the ED for breathing or allergy symptoms since we might be able to fit you in for a sick visit. Feel free to contact us anytime with any questions, problems, or concerns.  It was a pleasure to meet you today!  Websites that have reliable patient information: 1. American Academy of Asthma, Allergy, and Immunology: www.aaaai.org 2. Food Allergy Research and Education (FARE): foodallergy.org 3. Mothers of Asthmatics: http://www.asthmacommunitynetwork.org 4. American College of Allergy, Asthma, and Immunology: www.acaai.org   COVID-19 Vaccine Information can be found at: ShippingScam.co.uk For questions related to vaccine distribution or appointments, please email [email or web address] or call [phone].   We realize that you might be concerned about having an allergic reaction to the COVID19 vaccines. To help with that concern, WE ARE OFFERING THE COVID19 VACCINES IN OUR OFFICE! Ask the front desk for dates!     "Like" [place] on Facebook and Instagram for our latest updates!      A healthy democracy works best when [employer] participate! Make sure you are registered to vote! If you have moved or changed any of your contact information, you will need to get this updated before voting!  In some cases, you MAY be able to register to vote online: [email or web address]
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
## 2020-12-09 ENCOUNTER — Encounter: Payer: Self-pay | Admitting: Allergy & Immunology

## 2020-12-09 NOTE — Addendum Note (Signed)
Addended by: Valentina Shaggy on: 12/09/2020 10:49 PM   Modules accepted: Orders

## 2020-12-10 ENCOUNTER — Telehealth: Payer: Self-pay

## 2020-12-10 NOTE — Telephone Encounter (Signed)
-----   Message from Valentina Shaggy, MD sent at 12/10/2020 12:01 PM EST ----- ENT referral placed.

## 2020-12-16 NOTE — Telephone Encounter (Signed)
I called Dr Deeann Saint office to see when the patient was scheduled. She is scheduled for 03/03/2021 @ 9 AM.  Thanks

## 2020-12-16 NOTE — Telephone Encounter (Signed)
Thanks!   Weylyn Ricciuti, MD [facility]  

## 2020-12-21 DIAGNOSIS — H524 Presbyopia: Secondary | ICD-10-CM | POA: Diagnosis not present

## 2020-12-22 ENCOUNTER — Other Ambulatory Visit (HOSPITAL_COMMUNITY): Payer: Self-pay

## 2020-12-22 MED ORDER — RESTORA RX 60-1.25 MG PO CAPS
1.0000 | ORAL_CAPSULE | Freq: Every day | ORAL | 5 refills | Status: DC
  Filled 2020-12-22: qty 30, 30d supply, fill #0
  Filled 2021-01-26: qty 30, 30d supply, fill #1
  Filled 2021-03-01: qty 30, 30d supply, fill #2
  Filled 2021-03-30: qty 30, 30d supply, fill #3
  Filled 2021-05-04: qty 30, 30d supply, fill #4
  Filled 2021-07-08: qty 30, 30d supply, fill #5

## 2020-12-23 ENCOUNTER — Other Ambulatory Visit (HOSPITAL_COMMUNITY): Payer: Self-pay

## 2020-12-23 MED ORDER — XIIDRA 5 % OP SOLN
1.0000 [drp] | Freq: Two times a day (BID) | OPHTHALMIC | 99 refills | Status: DC
  Filled 2020-12-23: qty 60, 30d supply, fill #0
  Filled 2021-03-18: qty 60, 30d supply, fill #1
  Filled 2021-05-28: qty 60, 30d supply, fill #2
  Filled 2021-09-17: qty 60, 30d supply, fill #3
  Filled 2021-11-10: qty 60, 30d supply, fill #4

## 2021-01-07 DIAGNOSIS — K589 Irritable bowel syndrome without diarrhea: Secondary | ICD-10-CM | POA: Diagnosis not present

## 2021-01-07 DIAGNOSIS — R197 Diarrhea, unspecified: Secondary | ICD-10-CM | POA: Diagnosis not present

## 2021-01-07 DIAGNOSIS — K5900 Constipation, unspecified: Secondary | ICD-10-CM | POA: Diagnosis not present

## 2021-01-07 DIAGNOSIS — R109 Unspecified abdominal pain: Secondary | ICD-10-CM | POA: Diagnosis not present

## 2021-01-07 DIAGNOSIS — R1310 Dysphagia, unspecified: Secondary | ICD-10-CM | POA: Diagnosis not present

## 2021-01-08 ENCOUNTER — Other Ambulatory Visit: Payer: Self-pay

## 2021-01-08 ENCOUNTER — Ambulatory Visit
Admission: RE | Admit: 2021-01-08 | Discharge: 2021-01-08 | Disposition: A | Discharge status: Home / Self Care | Payer: 59 | Source: Ambulatory Visit | Attending: Student | Admitting: Student

## 2021-01-08 ENCOUNTER — Other Ambulatory Visit (HOSPITAL_COMMUNITY): Payer: Self-pay

## 2021-01-08 ENCOUNTER — Other Ambulatory Visit: Payer: Self-pay | Admitting: Student

## 2021-01-08 DIAGNOSIS — K5900 Constipation, unspecified: Secondary | ICD-10-CM | POA: Diagnosis not present

## 2021-01-08 DIAGNOSIS — K589 Irritable bowel syndrome without diarrhea: Secondary | ICD-10-CM

## 2021-01-09 DIAGNOSIS — R051 Acute cough: Secondary | ICD-10-CM | POA: Diagnosis not present

## 2021-01-09 DIAGNOSIS — J029 Acute pharyngitis, unspecified: Secondary | ICD-10-CM | POA: Diagnosis not present

## 2021-01-09 DIAGNOSIS — E860 Dehydration: Secondary | ICD-10-CM | POA: Diagnosis not present

## 2021-01-19 ENCOUNTER — Other Ambulatory Visit (HOSPITAL_COMMUNITY): Payer: Self-pay

## 2021-01-24 ENCOUNTER — Other Ambulatory Visit: Payer: Self-pay

## 2021-01-24 ENCOUNTER — Encounter (HOSPITAL_BASED_OUTPATIENT_CLINIC_OR_DEPARTMENT_OTHER): Payer: Self-pay | Admitting: Emergency Medicine

## 2021-01-24 ENCOUNTER — Emergency Department (HOSPITAL_BASED_OUTPATIENT_CLINIC_OR_DEPARTMENT_OTHER): Payer: 59

## 2021-01-24 ENCOUNTER — Emergency Department (HOSPITAL_BASED_OUTPATIENT_CLINIC_OR_DEPARTMENT_OTHER)
Admission: EM | Admit: 2021-01-24 | Discharge: 2021-01-24 | Disposition: A | Discharge status: Home / Self Care | Payer: 59 | Attending: Emergency Medicine | Admitting: Emergency Medicine

## 2021-01-24 DIAGNOSIS — R350 Frequency of micturition: Secondary | ICD-10-CM | POA: Insufficient documentation

## 2021-01-24 DIAGNOSIS — K219 Gastro-esophageal reflux disease without esophagitis: Secondary | ICD-10-CM | POA: Diagnosis not present

## 2021-01-24 DIAGNOSIS — R1032 Left lower quadrant pain: Secondary | ICD-10-CM | POA: Diagnosis not present

## 2021-01-24 DIAGNOSIS — R109 Unspecified abdominal pain: Secondary | ICD-10-CM | POA: Diagnosis not present

## 2021-01-24 DIAGNOSIS — R1031 Right lower quadrant pain: Secondary | ICD-10-CM | POA: Diagnosis not present

## 2021-01-24 DIAGNOSIS — Z79899 Other long term (current) drug therapy: Secondary | ICD-10-CM | POA: Insufficient documentation

## 2021-01-24 DIAGNOSIS — R110 Nausea: Secondary | ICD-10-CM | POA: Diagnosis not present

## 2021-01-24 DIAGNOSIS — R197 Diarrhea, unspecified: Secondary | ICD-10-CM | POA: Diagnosis not present

## 2021-01-24 LAB — COMPREHENSIVE METABOLIC PANEL
ALT: 17 U/L (ref 0–44)
AST: 17 U/L (ref 15–41)
Albumin: 4 g/dL (ref 3.5–5.0)
Alkaline Phosphatase: 78 U/L (ref 38–126)
Anion gap: 10 (ref 5–15)
BUN: 12 mg/dL (ref 6–20)
CO2: 22 mmol/L (ref 22–32)
Calcium: 9.1 mg/dL (ref 8.9–10.3)
Chloride: 106 mmol/L (ref 98–111)
Creatinine, Ser: 0.69 mg/dL (ref 0.44–1.00)
GFR, Estimated: >60 mL/min (ref >60)
Glucose, Bld: 84 mg/dL (ref 70–99)
Potassium: 3.9 mmol/L (ref 3.5–5.1)
Sodium: 138 mmol/L (ref 135–145)
Total Bilirubin: 0.6 mg/dL (ref 0.3–1.2)
Total Protein: 7.4 g/dL (ref 6.5–8.1)

## 2021-01-24 LAB — CBC
HCT: 39.8 % (ref 36.0–46.0)
Hemoglobin: 12.7 g/dL (ref 12.0–15.0)
MCH: 32.1 pg (ref 26.0–34.0)
MCHC: 31.9 g/dL (ref 30.0–36.0)
MCV: 100.5 fL — ABNORMAL HIGH (ref 80.0–100.0)
Platelets: 195 10*3/uL (ref 150–400)
RBC: 3.96 MIL/uL (ref 3.87–5.11)
RDW: 11.9 % (ref 11.5–15.5)
WBC: 5.4 10*3/uL (ref 4.0–10.5)
nRBC: 0 % (ref 0.0–0.2)

## 2021-01-24 LAB — URINALYSIS, ROUTINE W REFLEX MICROSCOPIC
Bilirubin Urine: NEGATIVE
Glucose, UA: NEGATIVE mg/dL
Hgb urine dipstick: NEGATIVE
Ketones, ur: NEGATIVE mg/dL
Leukocytes,Ua: NEGATIVE
Nitrite: NEGATIVE
Protein, ur: NEGATIVE mg/dL
Specific Gravity, Urine: <1.005 — ABNORMAL LOW (ref 1.005–1.030)
pH: 5 (ref 5.0–8.0)

## 2021-01-24 LAB — LIPASE, BLOOD: Lipase: 45 U/L (ref 11–51)

## 2021-01-24 LAB — PREGNANCY, URINE: Preg Test, Ur: NEGATIVE

## 2021-01-24 MED ORDER — FENTANYL CITRATE PF 50 MCG/ML IJ SOSY
25.0000 ug | PREFILLED_SYRINGE | Freq: Once | INTRAMUSCULAR | Status: AC
  Administered 2021-01-24: 25 ug via INTRAVENOUS
  Filled 2021-01-24: qty 1

## 2021-01-24 MED ORDER — SODIUM CHLORIDE 0.9 % IV BOLUS
1000.0000 mL | Freq: Once | INTRAVENOUS | Status: AC
  Administered 2021-01-24: 1000 mL via INTRAVENOUS

## 2021-01-24 MED ORDER — IOHEXOL 300 MG/ML  SOLN
100.0000 mL | Freq: Once | INTRAMUSCULAR | Status: AC | PRN
  Administered 2021-01-24: 100 mL via INTRAVENOUS

## 2021-01-24 MED ORDER — FENTANYL CITRATE PF 50 MCG/ML IJ SOSY
50.0000 ug | PREFILLED_SYRINGE | Freq: Once | INTRAMUSCULAR | Status: AC
  Administered 2021-01-24: 50 ug via INTRAVENOUS
  Filled 2021-01-24: qty 1

## 2021-01-24 MED ORDER — DICYCLOMINE HCL 10 MG PO CAPS
10.0000 mg | ORAL_CAPSULE | Freq: Once | ORAL | Status: AC
  Administered 2021-01-24: 10 mg via ORAL
  Filled 2021-01-24: qty 1

## 2021-01-24 MED ORDER — ONDANSETRON HCL 4 MG/2ML IJ SOLN
4.0000 mg | Freq: Once | INTRAMUSCULAR | Status: AC
  Administered 2021-01-24: 4 mg via INTRAVENOUS
  Filled 2021-01-24: qty 2

## 2021-01-24 NOTE — Discharge Instructions (Signed)

## 2021-01-24 NOTE — ED Provider Notes (Signed)
[facility] EMERGENCY DEPT Provider Note   CSN: [id number] Arrival date & time: 01/24/21  1513     History  Chief Complaint  Patient presents with   Abdominal Pain    Karen [name] is a 51 y.o. female.  HPI   Pt is a 51 y/o female with a h/o chest pain, dyspnea, bronchitis, sleep apnea, bacterial overgrowth syndrome, constipation, VTE, GERD, endometriosis, who presents to the ED today for eval of diarrhea that she has chronically but seems to be worse from her baseline.  She has had associated nausea, lower abdominal pain, frequency with urination  Denies vomiting, fevers, dysuria, hematochezia, melena.  Home Medications Prior to Admission medications   Medication Sig Start Date End Date Taking? Authorizing Provider  albuterol (VENTOLIN HFA) 108 (90 Base) MCG/ACT inhaler INHALE 1 PUFF BY MOUTH EVERY 4-6 HRS 12/13/19 12/12/20  Leeroy Cha, MD  clindamycin (CLINDAGEL) 1 % gel Apply 1 application topically as needed.    [provider]  Dapsone 7.5 % GEL Apply 1 application topically as needed.    [provider]  Elderberry 575 MG/5ML SYRP Take by mouth.    [provider]  Lactobacillus Casei-Folic Acid (RESTORA RX) 60-1.25 MG CAPS Take 1 capsule by mouth daily between meals 12/22/20     levocetirizine (XYZAL) 5 MG tablet Take 1 tablet (5 mg total) by mouth every evening. 04/25/17   Bobbitt, Sedalia Muta, MD  Lifitegrast Shirley Friar) 5 % SOLN Place 1 drop into both eyes 2 (two) times daily. 12/21/20     Lifitegrast 5 % SOLN Apply to eye.    [provider]  lubiprostone (AMITIZA) 24 MCG capsule Take 1 capsule (24 mcg total) by mouth daily with food and water. 08/21/20     lubiprostone (AMITIZA) 24 MCG capsule Take 1 capsule (24 mcg total) by mouth 2 (two) times daily with a meal 08/21/20     pantoprazole (PROTONIX) 20 MG tablet Take 1 tablet (20 mg total) by mouth daily. 09/25/20     Probiotic Product (PROBIOTIC PO) Take 1  capsule by mouth 4 (four) times daily as needed (for GI upset).    [provider]      Allergies    Molds & smuts    Review of Systems   Review of Systems See HPI for pertinent positives or negatives.   Physical Exam Updated Vital Signs BP 120/80    Pulse 82    Temp 98.4 F (36.9 C)    Resp 15    Ht 5\' 7"  (1.702 m)    Wt 89.8 kg    SpO2 100%    BMI 31.01 kg/m  Physical Exam Vitals and nursing note reviewed.  Constitutional:      General: She is not in acute distress.    Appearance: She is well-developed.  HENT:     Head: Normocephalic and atraumatic.  Eyes:     Conjunctiva/sclera: Conjunctivae normal.  Cardiovascular:     Rate and Rhythm: Normal rate and regular rhythm.     Heart sounds: No murmur heard. Pulmonary:     Effort: Pulmonary effort is normal. No respiratory distress.     Breath sounds: Normal breath sounds.  Abdominal:     General: Bowel sounds are normal.     Palpations: Abdomen is soft.     Tenderness: There is abdominal tenderness in the right lower quadrant, suprapubic area and left lower quadrant. There is no guarding or rebound.  Musculoskeletal:  General: No swelling.     Cervical back: Neck supple.  Skin:    General: Skin is warm and dry.     Capillary Refill: Capillary refill takes less than 2 seconds.  Neurological:     Mental Status: She is alert.  Psychiatric:        Mood and Affect: Mood normal.    ED Results / Procedures / Treatments   Labs (all labs ordered are listed, but only abnormal results are displayed) Labs Reviewed  CBC - Abnormal; Notable for the following components:      Result Value   MCV 100.5 (*)    All other components within normal limits  URINALYSIS, ROUTINE W REFLEX MICROSCOPIC - Abnormal; Notable for the following components:   Color, Urine COLORLESS (*)    Specific Gravity, Urine <1.005 (*)    All other components within normal limits  LIPASE, BLOOD  COMPREHENSIVE METABOLIC PANEL  PREGNANCY,  URINE    EKG None  Radiology CT ABDOMEN PELVIS W CONTRAST  Result Date: 01/24/2021 CLINICAL DATA:  Left lower quadrant abdominal pain. EXAM: CT ABDOMEN AND PELVIS WITH CONTRAST TECHNIQUE: Multidetector CT imaging of the abdomen and pelvis was performed using the standard protocol following bolus administration of intravenous contrast. RADIATION DOSE REDUCTION: This exam was performed according to the departmental dose-optimization program which includes automated exposure control, adjustment of the mA and/or kV according to patient size and/or use of iterative reconstruction technique. CONTRAST:  145mL OMNIPAQUE IOHEXOL 300 MG/ML  SOLN COMPARISON:  11/04/2020 FINDINGS: Lower chest: Unremarkable Hepatobiliary: No suspicious focal abnormality within the liver parenchyma. Gallbladder surgically absent. No intrahepatic or extrahepatic biliary dilation. Pancreas: No focal mass lesion. No dilatation of the main duct. No intraparenchymal cyst. No peripancreatic edema. Spleen: No splenomegaly. No focal mass lesion. Adrenals/Urinary Tract: No adrenal nodule or mass. 3-4 mm nonobstructing stone identified interpolar right kidney (axial 32/2 and coronal 36/5). Left kidney unremarkable. No evidence for hydroureter. The urinary bladder appears normal for the degree of distention. Stomach/Bowel: Tiny hiatal hernia. Stomach otherwise unremarkable. Duodenum is normally positioned as is the ligament of Treitz. No small bowel wall thickening. No small bowel dilatation. The terminal ileum is normal. The appendix is normal. No gross colonic mass. No colonic wall thickening. Vascular/Lymphatic: No abdominal aortic aneurysm. No abdominal aortic atherosclerotic calcification. There is no gastrohepatic or hepatoduodenal ligament lymphadenopathy. No retroperitoneal or mesenteric lymphadenopathy. No pelvic sidewall lymphadenopathy. Reproductive: Hysterectomy.  There is no adnexal mass. Other: No intraperitoneal free fluid.  Musculoskeletal: No worrisome lytic or sclerotic osseous abnormality. IMPRESSION: 1. No acute findings in the abdomen or pelvis. No findings to explain the patient's history of left lower quadrant pain. 2. Tiny nonobstructing right renal stone. 3. Tiny hiatal hernia. Electronically Signed   By: Misty Stanley M.D.   On: 01/24/2021 17:55    Procedures Procedures    Medications Ordered in ED Medications  fentaNYL (SUBLIMAZE) injection 25 mcg (has no administration in time range)  sodium chloride 0.9 % bolus 1,000 mL (1,000 mLs Intravenous New Bag/Given 01/24/21 1755)  dicyclomine (BENTYL) capsule 10 mg (10 mg Oral Given 01/24/21 1758)  ondansetron (ZOFRAN) injection 4 mg (4 mg Intravenous Given 01/24/21 1755)  fentaNYL (SUBLIMAZE) injection 50 mcg (50 mcg Intravenous Given 01/24/21 1756)  iohexol (OMNIPAQUE) 300 MG/ML solution 100 mL (100 mLs Intravenous Contrast Given 01/24/21 1732)    ED Course/ Medical Decision Making/ A&P  Medical Decision Making Amount and/or Complexity of Data Reviewed Labs: ordered. Radiology: ordered.  Risk Prescription drug management.   51 y/o female who presents for eval of diarrhea. Hx ibs  Reviewed/interpreted labs CBC without leukocytosis or anemia CMP unremarkable Lipase negative UA No evidence of UTI Urine pregnancy test negative  Reviewed/interpreted imaging CT abdomen/pelvis - 1. No acute findings in the abdomen or pelvis. No findings to explain the patient's history of left lower quadrant pain. 2. Tiny nonobstructing right renal stone. 3. Tiny hiatal hernia.   Patient was given IV fluids, antiemetics, pain medications and Bentyl.  On reassessment she feels some mild improvement but still has some residual discomfort.  Her repeat abdominal exam shows no focal tenderness, abdomen is soft.  There is no guarding or rebound tenderness.  Her work-up here is extremely reassuring.  She has not had any episodes of vomiting or  diarrhea since being in the ED.  She is nontoxic-appearing with normal vital signs and I feel that the remainder of her work-up can be completed as an outpatient.  I suspect that this is likely related to her underlying IBS or she may have a viral infection causing symptoms.  We discussed eating a bland diet and having close follow-up with her GI doctor.  She has an appointment with them in 3 days.  Further advised on return precautions.  She voices understanding of the plan and reasons to return.  All questions answered.  Patient stable for discharge.   Final Clinical Impression(s) / ED Diagnoses Final diagnoses:  Diarrhea, unspecified type    Rx / DC Orders ED Discharge Orders     None         Bishop Dublin 01/24/21 1849    Gareth Morgan, MD 01/26/21 2249

## 2021-01-24 NOTE — ED Triage Notes (Signed)
Pt present to ED POV. Pt c/o LQ abd pain. Pt reports that began ~10am. Pt reports diarrhea and nausea. Hx of multiple different abd issues.

## 2021-01-26 ENCOUNTER — Other Ambulatory Visit (HOSPITAL_COMMUNITY): Payer: Self-pay

## 2021-01-26 DIAGNOSIS — K582 Mixed irritable bowel syndrome: Secondary | ICD-10-CM | POA: Diagnosis not present

## 2021-01-26 DIAGNOSIS — R221 Localized swelling, mass and lump, neck: Secondary | ICD-10-CM | POA: Diagnosis not present

## 2021-01-26 DIAGNOSIS — R1310 Dysphagia, unspecified: Secondary | ICD-10-CM | POA: Diagnosis not present

## 2021-01-27 ENCOUNTER — Other Ambulatory Visit (HOSPITAL_COMMUNITY): Payer: Self-pay

## 2021-01-27 DIAGNOSIS — K582 Mixed irritable bowel syndrome: Secondary | ICD-10-CM | POA: Diagnosis not present

## 2021-01-27 DIAGNOSIS — K5901 Slow transit constipation: Secondary | ICD-10-CM | POA: Diagnosis not present

## 2021-01-27 MED ORDER — XIFAXAN 550 MG PO TABS
550.0000 mg | ORAL_TABLET | Freq: Three times a day (TID) | ORAL | 0 refills | Status: DC
  Filled 2021-01-27: qty 42, 14d supply, fill #0

## 2021-01-28 ENCOUNTER — Other Ambulatory Visit (HOSPITAL_COMMUNITY): Payer: Self-pay

## 2021-01-28 DIAGNOSIS — K219 Gastro-esophageal reflux disease without esophagitis: Secondary | ICD-10-CM | POA: Diagnosis not present

## 2021-01-28 DIAGNOSIS — R1312 Dysphagia, oropharyngeal phase: Secondary | ICD-10-CM | POA: Diagnosis not present

## 2021-01-28 DIAGNOSIS — R070 Pain in throat: Secondary | ICD-10-CM | POA: Diagnosis not present

## 2021-01-28 MED ORDER — NYSTATIN 100000 UNIT/ML MT SUSP
5.0000 mL | Freq: Four times a day (QID) | OROMUCOSAL | 5 refills | Status: DC
  Filled 2021-01-28: qty 250, 13d supply, fill #0

## 2021-01-28 MED ORDER — FAMOTIDINE 20 MG PO TABS
20.0000 mg | ORAL_TABLET | Freq: Every evening | ORAL | 5 refills | Status: DC
  Filled 2021-01-28: qty 90, 90d supply, fill #0
  Filled 2021-05-04: qty 90, 90d supply, fill #1

## 2021-02-23 ENCOUNTER — Other Ambulatory Visit (HOSPITAL_COMMUNITY): Payer: Self-pay

## 2021-03-01 ENCOUNTER — Other Ambulatory Visit (HOSPITAL_COMMUNITY): Payer: Self-pay

## 2021-03-05 DIAGNOSIS — N200 Calculus of kidney: Secondary | ICD-10-CM | POA: Diagnosis not present

## 2021-03-11 DIAGNOSIS — R1312 Dysphagia, oropharyngeal phase: Secondary | ICD-10-CM | POA: Diagnosis not present

## 2021-03-11 DIAGNOSIS — R070 Pain in throat: Secondary | ICD-10-CM | POA: Diagnosis not present

## 2021-03-11 DIAGNOSIS — K219 Gastro-esophageal reflux disease without esophagitis: Secondary | ICD-10-CM | POA: Diagnosis not present

## 2021-03-18 ENCOUNTER — Other Ambulatory Visit (HOSPITAL_COMMUNITY): Payer: Self-pay

## 2021-03-19 ENCOUNTER — Other Ambulatory Visit (HOSPITAL_COMMUNITY): Payer: Self-pay

## 2021-03-22 ENCOUNTER — Other Ambulatory Visit (HOSPITAL_COMMUNITY): Payer: Self-pay

## 2021-03-23 ENCOUNTER — Other Ambulatory Visit (HOSPITAL_COMMUNITY): Payer: Self-pay

## 2021-03-26 ENCOUNTER — Other Ambulatory Visit (HOSPITAL_COMMUNITY): Payer: Self-pay

## 2021-03-29 ENCOUNTER — Other Ambulatory Visit (HOSPITAL_COMMUNITY): Payer: Self-pay

## 2021-03-30 ENCOUNTER — Other Ambulatory Visit (HOSPITAL_COMMUNITY): Payer: Self-pay

## 2021-03-30 MED ORDER — LUBIPROSTONE 24 MCG PO CAPS
24.0000 ug | ORAL_CAPSULE | Freq: Two times a day (BID) | ORAL | 5 refills | Status: DC
  Filled 2021-03-30: qty 60, 30d supply, fill #0

## 2021-04-04 ENCOUNTER — Inpatient Hospital Stay (HOSPITAL_BASED_OUTPATIENT_CLINIC_OR_DEPARTMENT_OTHER)
Admission: EM | Admit: 2021-04-04 | Discharge: 2021-04-08 | DRG: 642 | Disposition: A | Discharge status: Home / Self Care | Payer: 59 | Attending: Internal Medicine | Admitting: Internal Medicine

## 2021-04-04 ENCOUNTER — Encounter (HOSPITAL_BASED_OUTPATIENT_CLINIC_OR_DEPARTMENT_OTHER): Payer: Self-pay | Admitting: Obstetrics and Gynecology

## 2021-04-04 ENCOUNTER — Emergency Department (HOSPITAL_BASED_OUTPATIENT_CLINIC_OR_DEPARTMENT_OTHER): Payer: 59

## 2021-04-04 ENCOUNTER — Other Ambulatory Visit: Payer: Self-pay

## 2021-04-04 DIAGNOSIS — K2100 Gastro-esophageal reflux disease with esophagitis, without bleeding: Secondary | ICD-10-CM | POA: Diagnosis present

## 2021-04-04 DIAGNOSIS — Z6831 Body mass index (BMI) 31.0-31.9, adult: Secondary | ICD-10-CM

## 2021-04-04 DIAGNOSIS — E669 Obesity, unspecified: Secondary | ICD-10-CM | POA: Diagnosis not present

## 2021-04-04 DIAGNOSIS — R509 Fever, unspecified: Secondary | ICD-10-CM | POA: Diagnosis not present

## 2021-04-04 DIAGNOSIS — E876 Hypokalemia: Secondary | ICD-10-CM | POA: Diagnosis not present

## 2021-04-04 DIAGNOSIS — Z9049 Acquired absence of other specified parts of digestive tract: Secondary | ICD-10-CM | POA: Diagnosis not present

## 2021-04-04 DIAGNOSIS — Z79899 Other long term (current) drug therapy: Secondary | ICD-10-CM | POA: Diagnosis not present

## 2021-04-04 DIAGNOSIS — R109 Unspecified abdominal pain: Secondary | ICD-10-CM | POA: Diagnosis not present

## 2021-04-04 DIAGNOSIS — D649 Anemia, unspecified: Secondary | ICD-10-CM | POA: Diagnosis present

## 2021-04-04 DIAGNOSIS — K5909 Other constipation: Secondary | ICD-10-CM | POA: Diagnosis present

## 2021-04-04 DIAGNOSIS — K449 Diaphragmatic hernia without obstruction or gangrene: Secondary | ICD-10-CM | POA: Diagnosis not present

## 2021-04-04 DIAGNOSIS — K529 Noninfective gastroenteritis and colitis, unspecified: Secondary | ICD-10-CM | POA: Diagnosis present

## 2021-04-04 DIAGNOSIS — Z833 Family history of diabetes mellitus: Secondary | ICD-10-CM

## 2021-04-04 DIAGNOSIS — Z83438 Family history of other disorder of lipoprotein metabolism and other lipidemia: Secondary | ICD-10-CM

## 2021-04-04 DIAGNOSIS — Z800 Family history of malignant neoplasm of digestive organs: Secondary | ICD-10-CM

## 2021-04-04 DIAGNOSIS — Z823 Family history of stroke: Secondary | ICD-10-CM

## 2021-04-04 DIAGNOSIS — G8929 Other chronic pain: Secondary | ICD-10-CM | POA: Diagnosis present

## 2021-04-04 DIAGNOSIS — R7401 Elevation of levels of liver transaminase levels: Secondary | ICD-10-CM | POA: Diagnosis not present

## 2021-04-04 DIAGNOSIS — R197 Diarrhea, unspecified: Secondary | ICD-10-CM | POA: Diagnosis not present

## 2021-04-04 DIAGNOSIS — Z888 Allergy status to other drugs, medicaments and biological substances status: Secondary | ICD-10-CM | POA: Diagnosis not present

## 2021-04-04 DIAGNOSIS — R1031 Right lower quadrant pain: Secondary | ICD-10-CM | POA: Diagnosis not present

## 2021-04-04 DIAGNOSIS — Z8249 Family history of ischemic heart disease and other diseases of the circulatory system: Secondary | ICD-10-CM | POA: Diagnosis not present

## 2021-04-04 DIAGNOSIS — R1032 Left lower quadrant pain: Secondary | ICD-10-CM | POA: Diagnosis not present

## 2021-04-04 DIAGNOSIS — R1084 Generalized abdominal pain: Secondary | ICD-10-CM | POA: Diagnosis not present

## 2021-04-04 DIAGNOSIS — R7989 Other specified abnormal findings of blood chemistry: Secondary | ICD-10-CM | POA: Diagnosis not present

## 2021-04-04 DIAGNOSIS — G4730 Sleep apnea, unspecified: Secondary | ICD-10-CM | POA: Diagnosis present

## 2021-04-04 DIAGNOSIS — R1030 Lower abdominal pain, unspecified: Principal | ICD-10-CM

## 2021-04-04 DIAGNOSIS — Z86718 Personal history of other venous thrombosis and embolism: Secondary | ICD-10-CM

## 2021-04-04 DIAGNOSIS — K582 Mixed irritable bowel syndrome: Secondary | ICD-10-CM | POA: Diagnosis present

## 2021-04-04 DIAGNOSIS — E739 Lactose intolerance, unspecified: Secondary | ICD-10-CM | POA: Diagnosis present

## 2021-04-04 LAB — URINALYSIS, ROUTINE W REFLEX MICROSCOPIC
Bilirubin Urine: NEGATIVE
Glucose, UA: NEGATIVE mg/dL
Hgb urine dipstick: NEGATIVE
Ketones, ur: NEGATIVE mg/dL
Leukocytes,Ua: NEGATIVE
Nitrite: NEGATIVE
Specific Gravity, Urine: 1.014 (ref 1.005–1.030)
pH: 5.5 (ref 5.0–8.0)

## 2021-04-04 LAB — COMPREHENSIVE METABOLIC PANEL
ALT: 754 U/L — ABNORMAL HIGH (ref 0–44)
AST: 1044 U/L — ABNORMAL HIGH (ref 15–41)
Albumin: 4.1 g/dL (ref 3.5–5.0)
Alkaline Phosphatase: 117 U/L (ref 38–126)
Anion gap: 10 (ref 5–15)
BUN: 13 mg/dL (ref 6–20)
CO2: 25 mmol/L (ref 22–32)
Calcium: 9.5 mg/dL (ref 8.9–10.3)
Chloride: 102 mmol/L (ref 98–111)
Creatinine, Ser: 0.83 mg/dL (ref 0.44–1.00)
GFR, Estimated: >60 mL/min (ref >60)
Glucose, Bld: 120 mg/dL — ABNORMAL HIGH (ref 70–99)
Potassium: 3.7 mmol/L (ref 3.5–5.1)
Sodium: 137 mmol/L (ref 135–145)
Total Bilirubin: 1.8 mg/dL — ABNORMAL HIGH (ref 0.3–1.2)
Total Protein: 7.5 g/dL (ref 6.5–8.1)

## 2021-04-04 LAB — CBC
HCT: 38.5 % (ref 36.0–46.0)
Hemoglobin: 12.7 g/dL (ref 12.0–15.0)
MCH: 32.6 pg (ref 26.0–34.0)
MCHC: 33 g/dL (ref 30.0–36.0)
MCV: 98.7 fL (ref 80.0–100.0)
Platelets: 177 10*3/uL (ref 150–400)
RBC: 3.9 MIL/uL (ref 3.87–5.11)
RDW: 12.5 % (ref 11.5–15.5)
WBC: 12 10*3/uL — ABNORMAL HIGH (ref 4.0–10.5)
nRBC: 0 % (ref 0.0–0.2)

## 2021-04-04 LAB — LIPASE, BLOOD: Lipase: 31 U/L (ref 11–51)

## 2021-04-04 MED ORDER — ENOXAPARIN SODIUM 40 MG/0.4ML IJ SOSY
40.0000 mg | PREFILLED_SYRINGE | INTRAMUSCULAR | Status: DC
  Administered 2021-04-04 – 2021-04-07 (×4): 40 mg via SUBCUTANEOUS
  Filled 2021-04-04 (×4): qty 0.4

## 2021-04-04 MED ORDER — ALBUTEROL SULFATE HFA 108 (90 BASE) MCG/ACT IN AERS: 1.0000 | INHALATION_SPRAY | Freq: Four times a day (QID) | RESPIRATORY_TRACT | Status: DC | PRN

## 2021-04-04 MED ORDER — SODIUM CHLORIDE 0.9 % IV BOLUS
1000.0000 mL | Freq: Once | INTRAVENOUS | Status: AC
  Administered 2021-04-04: 1000 mL via INTRAVENOUS

## 2021-04-04 MED ORDER — FENTANYL CITRATE PF 50 MCG/ML IJ SOSY
50.0000 ug | PREFILLED_SYRINGE | Freq: Once | INTRAMUSCULAR | Status: AC
  Administered 2021-04-04: 50 ug via INTRAVENOUS
  Filled 2021-04-04: qty 1

## 2021-04-04 MED ORDER — LACTATED RINGERS IV SOLN: INTRAVENOUS | Status: AC

## 2021-04-04 MED ORDER — ONDANSETRON HCL 4 MG/2ML IJ SOLN
4.0000 mg | Freq: Four times a day (QID) | INTRAMUSCULAR | Status: DC | PRN
  Administered 2021-04-05 – 2021-04-07 (×4): 4 mg via INTRAVENOUS
  Filled 2021-04-04 (×4): qty 2

## 2021-04-04 MED ORDER — FAMOTIDINE 20 MG PO TABS
20.0000 mg | ORAL_TABLET | Freq: Every day | ORAL | Status: DC
  Administered 2021-04-04 – 2021-04-07 (×4): 20 mg via ORAL
  Filled 2021-04-04 (×4): qty 1

## 2021-04-04 MED ORDER — IOHEXOL 300 MG/ML  SOLN
100.0000 mL | Freq: Once | INTRAMUSCULAR | Status: AC | PRN
  Administered 2021-04-04: 100 mL via INTRATHECAL

## 2021-04-04 MED ORDER — ONDANSETRON HCL 4 MG/2ML IJ SOLN
4.0000 mg | Freq: Once | INTRAMUSCULAR | Status: AC | PRN
  Administered 2021-04-04: 4 mg via INTRAVENOUS
  Filled 2021-04-04: qty 2

## 2021-04-04 MED ORDER — ONDANSETRON HCL 4 MG PO TABS
4.0000 mg | ORAL_TABLET | Freq: Four times a day (QID) | ORAL | Status: DC | PRN
  Administered 2021-04-07: 4 mg via ORAL
  Filled 2021-04-04: qty 1

## 2021-04-04 MED ORDER — ALBUTEROL SULFATE (2.5 MG/3ML) 0.083% IN NEBU: 2.5000 mg | INHALATION_SOLUTION | Freq: Four times a day (QID) | RESPIRATORY_TRACT | Status: DC | PRN

## 2021-04-04 MED ORDER — DICYCLOMINE HCL 10 MG/ML IM SOLN
20.0000 mg | Freq: Once | INTRAMUSCULAR | Status: AC
  Administered 2021-04-04: 20 mg via INTRAMUSCULAR
  Filled 2021-04-04: qty 2

## 2021-04-04 NOTE — H&P (Signed)
?History and Physical  ? ? ?Karen [name] KMQ:[id number] DOB: 07-23-1970 DOA: 04/04/2021 ? ?PCP: Leeroy Cha, MD  ?Patient coming from: Home ? ?I have personally briefly reviewed patient's old medical records in [place] ? ?Chief Complaint: Abdominal pain ? ?HPI: ?Karen [name] is a 51 y.o. female with medical history significant for chronic abdominal pain, IBS-M, GERD with esophagitis, history of Schatzki's ring s/p dilation, SIBO s/p treatment 04/2019 who presented to the ED for evaluation of abdominal pain. ? ?Patient reports chronic difficult to control GI issues secondary to mixed IBS with alternating episodes of diarrhea and constipation with overflow diarrhea.  She has been on Amitiza for over a year.  She is occasionally having to do bowel cleanses during constipation episodes. ? ?Patient states that she was instructed to do a bowel cleanse recently by her local GI team.  She took MiraLAX with Gatorade yesterday and since then has been having frequent watery bowel movements.  She has had associated nausea with minimal emesis as well as new right-sided abdominal pain, different from her usual abdominal discomfort.  She has not seen any blood in her stool.  She denies any recent antibiotic use.  She states she did take Tylenol once morning of admission but otherwise does not use it excessively, usually once a month.  She denies any recent alcohol use. ? ?[facility] ED Course  Labs/Imaging on admission: I have personally reviewed following labs and imaging studies. ? ?Initial vitals showed BP 115/74, pulse 74, RR 19, temp 98.1 ?F, SPO2 99% on room air. ? ?Labs show AST 1044, ALT 754, alk phos 117, total bilirubin 1.8, sodium 137, potassium 3.7, bicarb 25, BUN 13, creatinine 0.83, serum glucose 120, lipase 31, WBC 12.0, hemoglobin 12.7, platelets 177,000. ? ?Urinalysis negative for UTI.  Acute hepatitis panel in process. ? ?CT abdomen/pelvis with contrast shows long segment mild  wall thickening involving the ascending and transverse colon which may represent a mild colitis.  Small hiatal hernia noted. ? ?Patient was given 1 L normal saline, IM Bentyl, IV Zofran.  EDP discussed with on-call [facility] who recommended medical admission.  The hospitalist service was consulted to admit for further evaluation and management. ? ?Review of Systems: All systems reviewed and are negative except as documented in history of present illness above. ? ? ?Past Medical History:  ?Diagnosis Date  ? Adenomyosis   ? Anemia   ? Arthritis 2013  ? L knee gets steroid injections   ? Asthmatic bronchitis 01/31/2017  ? Back pain   ? Chronic constipation   ? Diarrhea   ? DVT of lower extremity (deep venous thrombosis) ([place])   ? Dysmenorrhea   ? Endometriosis   ? Esophageal stricture   ? GERD (gastroesophageal reflux disease)   ? Hiatal hernia   ? History of uterine fibroid   ? IBS (irritable bowel syndrome)   ? Interstitial cystitis   ? Kidney stones   ? Lactose intolerance   ? Migraine   ? with aura  ? Recurrent upper respiratory infection (URI)   ? Sebaceous cyst of breast, right lower inner quadrant 10/25/2012  ? Excised 11/21/12   ? Sleep apnea   ? Syncope, non cardiac   ? ? ?Past Surgical History:  ?Procedure Laterality Date  ? ARTHROSCOPIC HAGLUNDS REPAIR    ? BREAST CYST EXCISION Right 11/21/2012  ? Procedure: CYST EXCISION BREAST;  Surgeon: Haywood Lasso, MD;  Location: [facility];  Service: General;  Laterality:  Right;  ? BREAST EXCISIONAL BIOPSY Right 11/2012  ? CESAREAN SECTION  04/19/1993  ? CHOLECYSTECTOMY    ? COLONOSCOPY  05/02/2011  ? Procedure: COLONOSCOPY;  Surgeon: Winfield Cunas., MD;  Location: [facility];  Service: Endoscopy;  Laterality: N/A;  ? colonoscopy  11/04/2014  ? DENTAL SURGERY  03/06/2018  ? 2 surgeries ( 03/06/2018 and 04/06/2018)   to remove two separate benign tumors  ? ESOPHAGEAL MANOMETRY N/A 11/04/2015  ? Procedure: ESOPHAGEAL MANOMETRY (EM);  Surgeon: Laurence Spates, MD;  Location: [facility];  Service: Endoscopy;  Laterality: N/A;  ? ESOPHAGOGASTRODUODENOSCOPY  05/02/2011  ? Procedure: ESOPHAGOGASTRODUODENOSCOPY (EGD);  Surgeon: Winfield Cunas., MD;  Location: [facility];  Service: Endoscopy;  Laterality: N/A;  ? ESOPHAGOGASTRODUODENOSCOPY N/A 09/01/2014  ? Procedure: ESOPHAGOGASTRODUODENOSCOPY (EGD);  Surgeon: Laurence Spates, MD;  Location: [facility];  Service: Endoscopy;  Laterality: N/A;  ? KNEE SURGERY    ? LAPAROSCOPY    ? laproscopic abdominal    ? [place] IMPEDANCE STUDY N/A 11/04/2015  ? Procedure: [place] IMPEDANCE STUDY;  Surgeon: Laurence Spates, MD;  Location: [facility];  Service: Endoscopy;  Laterality: N/A;  ? VAGINAL HYSTERECTOMY  2010  ? TVH--ovaries remain  ? ? ?Social History: ? reports that she has never smoked. She has never used smokeless tobacco. She reports that she does not currently use alcohol. She reports that she does not use drugs. ? ?Allergies  ?Allergen Reactions  ? Molds & Smuts Cough  ?  sneezing  ? ? ?Family History  ?Problem Relation Age of Onset  ? Colon cancer Father 98  ? Hypertension Father   ? Stroke Father   ? Heart disease Father   ? Heart attack Father   ? Hyperlipidemia Father   ? Colon cancer Mother 55  ? Diabetes Maternal Uncle   ? Stroke Paternal Grandmother   ? Heart attack Paternal Grandmother   ? Heart attack Maternal Grandmother   ? Heart attack Maternal Grandfather   ? Cancer Maternal Uncle   ? Hypertension Sister   ? Diabetes Brother   ? Breast cancer Neg Hx   ? ? ? ?Prior to Admission medications   ?Medication Sig Start Date End Date Taking? Authorizing Provider  ?albuterol (VENTOLIN HFA) 108 (90 Base) MCG/ACT inhaler INHALE 1 PUFF BY MOUTH EVERY 4-6 HRS 12/13/19 12/12/20  Leeroy Cha, MD  ?clindamycin (CLINDAGEL) 1 % gel Apply 1 application topically as needed.    [provider]  ?Dapsone 7.5 % GEL Apply 1 application topically as needed.    [provider]  ?[id number] 371 MG/5ML SYRP  Take by mouth.    [provider]  ?famotidine (PEPCID) 20 MG tablet Take 1 tablet (20 mg total) by mouth at bedtime. 01/28/21     ?Lactobacillus Casei-Folic Acid (RESTORA RX) 60-1.25 MG CAPS Take 1 capsule by mouth daily between meals 12/22/20     ?levocetirizine (XYZAL) 5 MG tablet Take 1 tablet (5 mg total) by mouth every evening. 04/25/17   Bobbitt, Sedalia Muta, MD  ?Lifitegrast Shirley Friar) 5 % SOLN Place 1 drop into both eyes 2 (two) times daily. 12/21/20     ?Lifitegrast 5 % SOLN Apply to eye.    [provider]  ?lubiprostone (AMITIZA) 24 MCG capsule Take 1 capsule (24 mcg total) by mouth daily with food and water. 08/21/20     ?lubiprostone (AMITIZA) 24 MCG capsule Take 1 capsule (24 mcg total) by mouth 2 (two) times daily with a meal 08/21/20     ?  lubiprostone (AMITIZA) 24 MCG capsule Take 1 capsule (24 mcg total) by mouth 2 (two) times daily with a meal. 03/30/21     ?nystatin-hydrocortisone in diphenhydrAMINE liquid Rinse mouth with 23ms for 1-2 minutes four times daily then spit out 01/28/21     ?pantoprazole (PROTONIX) 20 MG tablet Take 1 tablet (20 mg total) by mouth daily. 09/25/20     ?Probiotic Product (PROBIOTIC PO) Take 1 capsule by mouth 4 (four) times daily as needed (for GI upset).    [provider]  ?rifaximin (XIFAXAN) 550 MG TABS tablet Take 1 tablet (550 mg total) by mouth 3 (three) times daily for 14 days 01/27/21     ? ? ?Physical Exam: ?Vitals:  ? 04/04/21 1630 04/04/21 1836 04/04/21 1920 04/04/21 2105  ?BP: 93/67 113/76 109/73 112/78  ?Pulse: 73 79 70 65  ?Resp: 20 (!) _0 ?Temp:    98.6 ?F (37 ?C)  ?TempSrc:    Oral  ?SpO2: 99% 100% 97% 99%  ?Weight:    92.1 kg  ?Height:    _1  (1.702 m)  ? ?Constitutional: Sitting up in bed, NAD, calm, comfortable ?Eyes: PERRL, lids and conjunctivae normal ?ENMT: Mucous membranes are moist. Posterior pharynx clear of any exudate or lesions.Normal dentition.  ?Neck: normal, supple, no masses. ?Respiratory: clear to  auscultation bilaterally, no wheezing, no crackles. Normal respiratory effort. No accessory muscle use.  ?Cardiovascular: Regular rate and rhythm, no murmurs / rubs / gallops. No extremity edema. 2+ pedal pulses. ?Ab

## 2021-04-04 NOTE — ED Notes (Signed)
Patient transported to CT 

## 2021-04-04 NOTE — Assessment & Plan Note (Signed)
AST 1044, ALT 754, alk phos 117, T. bili 1.8 on admission.  CT A/P shows prior cholecystectomy changes without focal liver abnormality or biliary dilatation. ?-Follow acute hepatitis panel ?-Trend LFTs ?-Continue IV fluid hydration overnight ?

## 2021-04-04 NOTE — Hospital Course (Signed)
Karen [name] is a 51 y.o. female with medical history significant for chronic abdominal pain, IBS-M, GERD with esophagitis, history of Schatzki's ring s/p dilation, SIBO s/p treatment 04/2019 who is admitted with elevated LFTs and mild colitis. ?

## 2021-04-04 NOTE — ED Notes (Signed)
Patient and family member at bedside updated on transport status.  ?

## 2021-04-04 NOTE — ED Provider Notes (Addendum)
?[facility] EMERGENCY DEPT ?Provider Note ? ? ?CSN: [id number] ?Arrival date & time: 04/04/21  1503 ? ?  ? ?History ? ?Chief Complaint  ?Patient presents with  ? Abdominal Pain  ? ? ?Karen [name] is a 51 y.o. female with medical history of hiatal hernia, mixed IBS, lactose intolerance, chronic constipation, GERD.  Patient presents to ED for evaluation of abdominal pain, nausea and vomiting.  Patient states that abdominal pain started 1 day ago along with diarrhea, nausea and vomiting.  Patient reports that abdominal pain is located in her lower abdomen diffusely that localizes in her left lower quadrant but also her right lower quadrant occasionally.  Patient reports that she recently did bowel cleanse that was encouraged by her GI doctor that consists of MiraLax mixed with Gatorade.  Patient has lengthy history of chronic abdominal pain and has been seen by [facility] as well as [facility].  Patient reports that this evenings episode of abdominal pain is more severe than normal.  She also states that her diarrhea is more intense and frequent.  She is endorsing abdominal pain, nausea, vomiting, diarrhea, body aches.  Patient denies any shortness of breath, chest pain, blood in stool, blood in vomit, painful urination. ? ? ? ? ?Abdominal Pain ?Associated symptoms: chills, diarrhea, nausea and vomiting   ?Associated symptoms: no chest pain, no dysuria, no fever and no shortness of breath   ? ?  ? ?Home Medications ?Prior to Admission medications   ?Medication Sig Start Date End Date Taking? Authorizing Provider  ?albuterol (VENTOLIN HFA) 108 (90 Base) MCG/ACT inhaler INHALE 1 PUFF BY MOUTH EVERY 4-6 HRS 12/13/19 12/12/20  Leeroy Cha, MD  ?clindamycin (CLINDAGEL) 1 % gel Apply 1 application topically as needed.    [provider]  ?Dapsone 7.5 % GEL Apply 1 application topically as needed.    [provider]  ?DVVOHYWVPX 106 MG/5ML SYRP Take by mouth.    [provider]   ?famotidine (PEPCID) 20 MG tablet Take 1 tablet (20 mg total) by mouth at bedtime. 01/28/21     ?Lactobacillus Casei-Folic Acid (RESTORA RX) 60-1.25 MG CAPS Take 1 capsule by mouth daily between meals 12/22/20     ?levocetirizine (XYZAL) 5 MG tablet Take 1 tablet (5 mg total) by mouth every evening. 04/25/17   Bobbitt, Sedalia Muta, MD  ?Lifitegrast Shirley Friar) 5 % SOLN Place 1 drop into both eyes 2 (two) times daily. 12/21/20     ?Lifitegrast 5 % SOLN Apply to eye.    [provider]  ?lubiprostone (AMITIZA) 24 MCG capsule Take 1 capsule (24 mcg total) by mouth daily with food and water. 08/21/20     ?lubiprostone (AMITIZA) 24 MCG capsule Take 1 capsule (24 mcg total) by mouth 2 (two) times daily with a meal 08/21/20     ?lubiprostone (AMITIZA) 24 MCG capsule Take 1 capsule (24 mcg total) by mouth 2 (two) times daily with a meal. 03/30/21     ?nystatin-hydrocortisone in diphenhydrAMINE liquid Rinse mouth with 76ms for 1-2 minutes four times daily then spit out 01/28/21     ?pantoprazole (PROTONIX) 20 MG tablet Take 1 tablet (20 mg total) by mouth daily. 09/25/20     ?Probiotic Product (PROBIOTIC PO) Take 1 capsule by mouth 4 (four) times daily as needed (for GI upset).    [provider]  ?rifaximin (XIFAXAN) 550 MG TABS tablet Take 1 tablet (550 mg total) by mouth 3 (three) times daily for 14 days 01/27/21     ?   ? ?  Allergies    ?Molds & smuts   ? ?Review of Systems   ?Review of Systems  ?Constitutional:  Positive for chills. Negative for fever.  ?Respiratory:  Negative for shortness of breath.   ?Cardiovascular:  Negative for chest pain.  ?Gastrointestinal:  Positive for abdominal pain, diarrhea, nausea and vomiting. Negative for blood in stool.  ?Genitourinary:  Negative for dysuria.  ?All other systems reviewed and are negative. ? ?Physical Exam ?Updated Vital Signs ?BP 109/73 (BP Location: Right Arm)   Pulse 70   Temp 98.1 ?F (36.7 ?C)   Resp 18   SpO2 97%  ?Physical Exam ?Vitals and nursing note  reviewed.  ?Constitutional:   ?   General: She is not in acute distress. ?   Appearance: She is well-developed. She is not ill-appearing, toxic-appearing or diaphoretic.  ?HENT:  ?   Head: Normocephalic and atraumatic.  ?   Nose: Nose normal. No congestion.  ?   Mouth/Throat:  ?   Mouth: Mucous membranes are moist.  ?   Pharynx: Oropharynx is clear. No oropharyngeal exudate.  ?Eyes:  ?   Extraocular Movements: Extraocular movements intact.  ?   Pupils: Pupils are equal, round, and reactive to light.  ?Cardiovascular:  ?   Rate and Rhythm: Normal rate and regular rhythm.  ?Pulmonary:  ?   Effort: Pulmonary effort is normal.  ?   Breath sounds: Normal breath sounds. No wheezing.  ?Abdominal:  ?   General: Abdomen is flat. Bowel sounds are normal.  ?   Palpations: Abdomen is soft.  ?   Tenderness: There is abdominal tenderness in the right lower quadrant, suprapubic area and left lower quadrant.  ?Musculoskeletal:  ?   Cervical back: Normal range of motion and neck supple. No tenderness.  ?Skin: ?   General: Skin is warm and dry.  ?   Capillary Refill: Capillary refill takes less than 2 seconds.  ?Neurological:  ?   General: No focal deficit present.  ?   Mental Status: She is alert and oriented to person, place, and time.  ? ? ?ED Results / Procedures / Treatments   ?Labs ?(all labs ordered are listed, but only abnormal results are displayed) ?Labs Reviewed  ?COMPREHENSIVE METABOLIC PANEL - Abnormal; Notable for the following components:  ?    Result Value  ? Glucose, Bld 120 (*)   ? AST 1,044 (*)   ? ALT 754 (*)   ? Total Bilirubin 1.8 (*)   ? All other components within normal limits  ?CBC - Abnormal; Notable for the following components:  ? WBC 12.0 (*)   ? All other components within normal limits  ?URINALYSIS, ROUTINE W REFLEX MICROSCOPIC - Abnormal; Notable for the following components:  ? Protein, ur TRACE (*)   ? All other components within normal limits  ?LIPASE, BLOOD  ?HEPATITIS PANEL, ACUTE   ? ? ?EKG ?None ? ?Radiology ?CT ABDOMEN PELVIS W CONTRAST ? ?Result Date: 04/04/2021 ?CLINICAL DATA:  Left lower quadrant abdominal pain EXAM: CT ABDOMEN AND PELVIS WITH CONTRAST TECHNIQUE: Multidetector CT imaging of the abdomen and pelvis was performed using the standard protocol following bolus administration of intravenous contrast. RADIATION DOSE REDUCTION: This exam was performed according to the departmental dose-optimization program which includes automated exposure control, adjustment of the mA and/or kV according to patient size and/or use of iterative reconstruction technique. CONTRAST:  141m OMNIPAQUE IOHEXOL 300 MG/ML  SOLN COMPARISON:  01/24/2021 FINDINGS: Lower chest: No acute abnormality. Hepatobiliary: No focal liver abnormality  is seen. Status post cholecystectomy. No biliary dilatation. Pancreas: Unremarkable. No pancreatic ductal dilatation or surrounding inflammatory changes. Spleen: Normal in size without focal abnormality. Adrenals/Urinary Tract: Unremarkable adrenal glands. Kidneys enhance symmetrically without focal lesion, stone, or hydronephrosis. Ureters are nondilated. Urinary bladder appears unremarkable. Stomach/Bowel: Small hiatal hernia. Stomach within normal limits. No dilated loops of bowel. Long segment mild wall thickening involving the ascending and transverse colon. No pericolonic inflammatory changes. No appendiceal inflammation. Vascular/Lymphatic: No significant vascular findings are present. No enlarged abdominal or pelvic lymph nodes. Reproductive: Status post hysterectomy. No adnexal masses. Other: No free fluid. No abdominopelvic fluid collection. No pneumoperitoneum. No abdominal wall hernia. Musculoskeletal: No acute or significant osseous findings. IMPRESSION: 1. Long segment mild wall thickening involving the ascending and transverse colon, which may represent a mild colitis. 2. Small hiatal hernia. Electronically Signed   By: Davina Poke D.O.   On: 04/04/2021  17:05   ? ?Procedures ?Procedures  ? ?Medications Ordered in ED ?Medications  ?ondansetron (ZOFRAN) injection 4 mg (4 mg Intravenous Given 04/04/21 1544)  ?sodium chloride 0.9 % bolus 1,000 mL (0 mLs Intravenous Stopped 4/

## 2021-04-04 NOTE — ED Notes (Signed)
Patient given cup of chicken noodle soup, coke and a water.  OK per PA Groce.  ?

## 2021-04-04 NOTE — ED Triage Notes (Signed)
Patient reports significant abdominal pain that started x1 day ago. Patient reports she has a lengthy GI hx and has been suffering from Diarrhea, Nausea and Emesis. Patient reports she did a Go-lightly style cleanse and she has been hurting worse. Reports her GI specialist told her to do the cleanse.  ?

## 2021-04-04 NOTE — Assessment & Plan Note (Addendum)
Mild colitis changes of ascending and transverse colon noted on CT imaging.  Possibly related to recent laxative use for bowel cleanse.  Lower suspicion for infectious colitis. ?-Continue IV fluid hydration overnight ?-Advance diet as tolerated ?-Hold home Amitiza for now ?

## 2021-04-05 ENCOUNTER — Encounter (HOSPITAL_COMMUNITY): Payer: Self-pay | Admitting: Internal Medicine

## 2021-04-05 ENCOUNTER — Observation Stay (HOSPITAL_COMMUNITY): Payer: 59

## 2021-04-05 DIAGNOSIS — K582 Mixed irritable bowel syndrome: Secondary | ICD-10-CM | POA: Diagnosis present

## 2021-04-05 DIAGNOSIS — R7401 Elevation of levels of liver transaminase levels: Secondary | ICD-10-CM | POA: Diagnosis not present

## 2021-04-05 DIAGNOSIS — Z800 Family history of malignant neoplasm of digestive organs: Secondary | ICD-10-CM | POA: Diagnosis not present

## 2021-04-05 DIAGNOSIS — R197 Diarrhea, unspecified: Secondary | ICD-10-CM | POA: Diagnosis not present

## 2021-04-05 DIAGNOSIS — R7989 Other specified abnormal findings of blood chemistry: Secondary | ICD-10-CM | POA: Diagnosis not present

## 2021-04-05 DIAGNOSIS — G8929 Other chronic pain: Secondary | ICD-10-CM | POA: Diagnosis present

## 2021-04-05 DIAGNOSIS — Z823 Family history of stroke: Secondary | ICD-10-CM | POA: Diagnosis not present

## 2021-04-05 DIAGNOSIS — Z6831 Body mass index (BMI) 31.0-31.9, adult: Secondary | ICD-10-CM | POA: Diagnosis not present

## 2021-04-05 DIAGNOSIS — E669 Obesity, unspecified: Secondary | ICD-10-CM | POA: Diagnosis present

## 2021-04-05 DIAGNOSIS — G4730 Sleep apnea, unspecified: Secondary | ICD-10-CM | POA: Diagnosis present

## 2021-04-05 DIAGNOSIS — K2100 Gastro-esophageal reflux disease with esophagitis, without bleeding: Secondary | ICD-10-CM | POA: Diagnosis present

## 2021-04-05 DIAGNOSIS — Z833 Family history of diabetes mellitus: Secondary | ICD-10-CM | POA: Diagnosis not present

## 2021-04-05 DIAGNOSIS — R109 Unspecified abdominal pain: Secondary | ICD-10-CM | POA: Diagnosis not present

## 2021-04-05 DIAGNOSIS — Z888 Allergy status to other drugs, medicaments and biological substances status: Secondary | ICD-10-CM | POA: Diagnosis not present

## 2021-04-05 DIAGNOSIS — Z83438 Family history of other disorder of lipoprotein metabolism and other lipidemia: Secondary | ICD-10-CM | POA: Diagnosis not present

## 2021-04-05 DIAGNOSIS — K5909 Other constipation: Secondary | ICD-10-CM | POA: Diagnosis present

## 2021-04-05 DIAGNOSIS — E876 Hypokalemia: Secondary | ICD-10-CM | POA: Diagnosis not present

## 2021-04-05 DIAGNOSIS — Z79899 Other long term (current) drug therapy: Secondary | ICD-10-CM | POA: Diagnosis not present

## 2021-04-05 DIAGNOSIS — D649 Anemia, unspecified: Secondary | ICD-10-CM | POA: Diagnosis present

## 2021-04-05 DIAGNOSIS — E739 Lactose intolerance, unspecified: Secondary | ICD-10-CM | POA: Diagnosis present

## 2021-04-05 DIAGNOSIS — Z8249 Family history of ischemic heart disease and other diseases of the circulatory system: Secondary | ICD-10-CM | POA: Diagnosis not present

## 2021-04-05 DIAGNOSIS — Z9049 Acquired absence of other specified parts of digestive tract: Secondary | ICD-10-CM | POA: Diagnosis not present

## 2021-04-05 DIAGNOSIS — K449 Diaphragmatic hernia without obstruction or gangrene: Secondary | ICD-10-CM | POA: Diagnosis present

## 2021-04-05 DIAGNOSIS — Z86718 Personal history of other venous thrombosis and embolism: Secondary | ICD-10-CM | POA: Diagnosis not present

## 2021-04-05 DIAGNOSIS — R1084 Generalized abdominal pain: Secondary | ICD-10-CM | POA: Diagnosis not present

## 2021-04-05 LAB — COMPREHENSIVE METABOLIC PANEL
ALT: 727 U/L — ABNORMAL HIGH (ref 0–44)
AST: 562 U/L — ABNORMAL HIGH (ref 15–41)
Albumin: 3.2 g/dL — ABNORMAL LOW (ref 3.5–5.0)
Alkaline Phosphatase: 127 U/L — ABNORMAL HIGH (ref 38–126)
Anion gap: 4 — ABNORMAL LOW (ref 5–15)
BUN: 8 mg/dL (ref 6–20)
CO2: 27 mmol/L (ref 22–32)
Calcium: 8.5 mg/dL — ABNORMAL LOW (ref 8.9–10.3)
Chloride: 106 mmol/L (ref 98–111)
Creatinine, Ser: 0.63 mg/dL (ref 0.44–1.00)
GFR, Estimated: >60 mL/min (ref >60)
Glucose, Bld: 94 mg/dL (ref 70–99)
Potassium: 3.2 mmol/L — ABNORMAL LOW (ref 3.5–5.1)
Sodium: 137 mmol/L (ref 135–145)
Total Bilirubin: 2.8 mg/dL — ABNORMAL HIGH (ref 0.3–1.2)
Total Protein: 6.1 g/dL — ABNORMAL LOW (ref 6.5–8.1)

## 2021-04-05 LAB — CBC
HCT: 34.1 % — ABNORMAL LOW (ref 36.0–46.0)
Hemoglobin: 11 g/dL — ABNORMAL LOW (ref 12.0–15.0)
MCH: 32.8 pg (ref 26.0–34.0)
MCHC: 32.3 g/dL (ref 30.0–36.0)
MCV: 101.8 fL — ABNORMAL HIGH (ref 80.0–100.0)
Platelets: 163 10*3/uL (ref 150–400)
RBC: 3.35 MIL/uL — ABNORMAL LOW (ref 3.87–5.11)
RDW: 12.3 % (ref 11.5–15.5)
WBC: 9.6 10*3/uL (ref 4.0–10.5)
nRBC: 0 % (ref 0.0–0.2)

## 2021-04-05 LAB — HIV ANTIBODY (ROUTINE TESTING W REFLEX): HIV Screen 4th Generation wRfx: NONREACTIVE

## 2021-04-05 LAB — IRON AND TIBC
Iron: 123 ug/dL (ref 28–170)
Saturation Ratios: 50 % — ABNORMAL HIGH (ref 10.4–31.8)
TIBC: 246 ug/dL — ABNORMAL LOW (ref 250–450)
UIBC: 123 ug/dL

## 2021-04-05 LAB — HEPATITIS PANEL, ACUTE
HCV Ab: NONREACTIVE
Hep A IgM: NONREACTIVE
Hep B C IgM: NONREACTIVE
Hepatitis B Surface Ag: NONREACTIVE

## 2021-04-05 LAB — FERRITIN: Ferritin: 1466 ng/mL — ABNORMAL HIGH (ref 11–307)

## 2021-04-05 MED ORDER — SODIUM CHLORIDE 0.9 % IV SOLN: INTRAVENOUS | Status: DC

## 2021-04-05 MED ORDER — HYDROMORPHONE HCL 1 MG/ML IJ SOLN
1.0000 mg | Freq: Once | INTRAMUSCULAR | Status: AC
  Administered 2021-04-05: 1 mg via INTRAVENOUS
  Filled 2021-04-05: qty 1

## 2021-04-05 MED ORDER — POTASSIUM CHLORIDE CRYS ER 20 MEQ PO TBCR
40.0000 meq | EXTENDED_RELEASE_TABLET | Freq: Two times a day (BID) | ORAL | Status: AC
  Administered 2021-04-05 (×2): 40 meq via ORAL
  Filled 2021-04-05 (×2): qty 2

## 2021-04-05 NOTE — Progress Notes (Signed)
?  Progress Note ? ? ?Patient: Karen [name] UJW:[id number] DOB: [birth year] DOA: 04/04/2021     0 ?DOS: the patient was seen and examined on 04/05/2021 ?  ?Brief hospital course: ?GAYNELL EGGLETON is a 51 y.o. female with medical history significant for chronic abdominal pain, IBS-M, GERD with esophagitis, history of Schatzki's ring s/p dilation, SIBO s/p treatment 04/2019 who is admitted with elevated LFTs and mild colitis. ? ?Assessment and Plan: ? ?Elevated LFTs, unclear etiology ?AST 1044, ALT 754, alk phos 117, T. bili 1.8 on admission.  LFTs improving. Tbili increased to 2.8. Obtain RUQ U/S to rule out biliary obstruction. CT A/P shows prior cholecystectomy changes without focal liver abnormality or biliary dilatation. Hepatitis serologies negative. Follows with [facility] who are seeing her in consultation here. Autoimmune labs pending. ? ?Abdominal pain/colitis ?Mild colitis changes of ascending and transverse colon noted on CT imaging.  Possibly related to recent laxative use for bowel cleanse.  Lower suspicion for infectious colitis. Continue IV fluid hydration. Advance diet as tolerated. Holding home Amitiza fow now. Per GI recommend starting Miralax bowel regimen if she has not had a bowel movement by tomorrow morning. Supportinve care with anti-emetics ? ?GERD, esophagitis ?Continue home Pepcid ? ?Subjective: Admitted overnight. Still having abdominal pain but slightly improved. No fevers or chills.  ? ?Physical Exam: ?Vitals:  ? 04/05/21 0127 04/05/21 0512 04/05/21 0900 04/05/21 1322  ?BP: 103/67 101/66 113/75 118/75  ?Pulse: 66 71 69 63  ?Resp: $Remov'16 17 16   'mTxHpA$ ?Temp: 98.6 ?F (37 ?C) 98.8 ?F (37.1 ?C) 98.3 ?F (36.8 ?C) 97.8 ?F (36.6 ?C)  ?TempSrc: Oral Oral Oral Oral  ?SpO2: 99% 98% 97% 100%  ?Weight:      ?Height:      ? ?Constitutional: Sitting up in bed, NAD, calm, comfortable ?Eyes: PERRL, lids and conjunctivae normal ?ENMT: Mucous membranes are moist. Posterior pharynx clear of any exudate or lesions.Normal  dentition.  ?Neck: normal, supple, no masses. ?Respiratory: clear to auscultation bilaterally, no wheezing, no crackles. Normal respiratory effort. No accessory muscle use.  ?Cardiovascular: Regular rate and rhythm, no murmurs / rubs / gallops. No extremity edema. 2+ pedal pulses. ?Abdomen: Mild generalized tenderness, no masses palpated. No hepatosplenomegaly. Bowel sounds positive.  ?Musculoskeletal: no clubbing / cyanosis. No joint deformity upper and lower extremities. Good ROM, no contractures. Normal muscle tone.  ?Skin: no rashes, lesions, ulcers. No induration ?Neurologic: CN 2-12 grossly intact. Sensation intact. Strength 5/5 in all 4.  ?Psychiatric: Normal judgment and insight. Alert and oriented x 3. Normal mood.  ?  ? ?Data Reviewed: ?LFTs improving, Tbili increased to 2.8. Reviewed imaging reports. ? ?Family Communication: Husband present at bedside ? ?Disposition: ?Status is: Inpatient ?Remains inpatient appropriate because: Requiring IV fluids ?Planned Discharge Destination: Home ?DVT prophylaxis: Lovenox subQ ? ?Time spent: 50 minutes ? ?Author: ?Leslee Home, DO ?04/05/2021 3:50 PM ? ?For on call review [email or web address].  ?

## 2021-04-05 NOTE — Consult Note (Addendum)
Referring Provider: TRH ?Primary Care Physician:  Leeroy Cha, MD ?Primary Gastroenterologist:  Former patient of Dr. Cristina Gong  ? ?Reason for Consultation:  Abdominal pain, elevated LFTs ? ?HPI: Karen [name] is a 51 y.o. female with medical history significant for chronic abdominal pain, IBS-M, GERD with esophagitis, history of Schatzki's ring s/p dilation, SIBO s/p treatment 04/2019 who presented to the ED for evaluation of abdominal pain.  ? ?Presented to the ED 04/04/2021 for 1 day of severe abdominal pain, diarrhea, nausea and vomiting.  Notes the pain is in the left lower quadrant.  She has been doing bowel cleanses for her chronic constipation per recommendation of [facility].  She has a history of chronic abdominal pain seen with [facility] as well as [facility]. ? ?Been on Amitiza for over a year for alternating constipation and diarrhea.  ? ?Last seen with Deliah Goody PA-C 01/27/2021 for alternating bowel habits.  ? ?She has not seen any blood in her stool.  She denies any recent antibiotic use.  She states she did take Tylenol once morning of admission but otherwise does not use it excessively, usually once a month.  She denies any recent alcohol use.Denies recent travel. Denies IV drug use and sick contacts or starting new medications. Notes she is up to date on hepatitis vaccination. ? ?Patient presented to the [facility] on 04/04/2021 for fevers and diarrhea amd severe abdominal pain. She was recommended to go the the Ed for further evaluation.  ? ?Colonoscopy 11/22/2019 - Dr. Cristina Gong ?Entire examined colon normal ?Biopsies negative for microscopic colitis ?Recall 5 years.  Family history of colon cancer in her father at age 17 ? ?EGD 11/13/2018 at [facility] ?Tortuous esophagus ?White nummular lesions in the mucosal ?Nonobstructing Schatzki's ring dilated ?LA grade a reflux esophagitis ?2 cm hiatal hernia ?Erythematous mucosa in the antrum ?Bilious gastric fluid antrum ?Moderate amount of bile present ?Unable to  obtain pathology results ? ?Past Medical History:  ?Diagnosis Date  ? Adenomyosis   ? Anemia   ? Arthritis 2013  ? L knee gets steroid injections   ? Asthmatic bronchitis 01/31/2017  ? Back pain   ? Chronic constipation   ? Diarrhea   ? DVT of lower extremity (deep venous thrombosis) ([place])   ? Dysmenorrhea   ? Endometriosis   ? Esophageal stricture   ? GERD (gastroesophageal reflux disease)   ? Hiatal hernia   ? History of uterine fibroid   ? IBS (irritable bowel syndrome)   ? Interstitial cystitis   ? Kidney stones   ? Lactose intolerance   ? Migraine   ? with aura  ? Recurrent upper respiratory infection (URI)   ? Sebaceous cyst of breast, right lower inner quadrant 10/25/2012  ? Excised 11/21/12   ? Sleep apnea   ? Syncope, non cardiac   ? ? ?Past Surgical History:  ?Procedure Laterality Date  ? ARTHROSCOPIC HAGLUNDS REPAIR    ? BREAST CYST EXCISION Right 11/21/2012  ? Procedure: CYST EXCISION BREAST;  Surgeon: Haywood Lasso, MD;  Location: [place] [facility];  Service: General;  Laterality: Right;  ? BREAST EXCISIONAL BIOPSY Right 11/2012  ? CESAREAN SECTION  04/19/1993  ? CHOLECYSTECTOMY    ? COLONOSCOPY  05/02/2011  ? Procedure: COLONOSCOPY;  Surgeon: Winfield Cunas., MD;  Location: [facility];  Service: Endoscopy;  Laterality: N/A;  ? colonoscopy  11/04/2014  ? DENTAL SURGERY  03/06/2018  ? 2 surgeries ( 03/06/2018 and 04/06/2018)   to remove  two separate benign tumors  ? ESOPHAGEAL MANOMETRY N/A 11/04/2015  ? Procedure: ESOPHAGEAL MANOMETRY (EM);  Surgeon: Laurence Spates, MD;  Location: [facility];  Service: Endoscopy;  Laterality: N/A;  ? ESOPHAGOGASTRODUODENOSCOPY  05/02/2011  ? Procedure: ESOPHAGOGASTRODUODENOSCOPY (EGD);  Surgeon: Winfield Cunas., MD;  Location: [facility];  Service: Endoscopy;  Laterality: N/A;  ? ESOPHAGOGASTRODUODENOSCOPY N/A 09/01/2014  ? Procedure: ESOPHAGOGASTRODUODENOSCOPY (EGD);  Surgeon: Laurence Spates, MD;  Location: [facility];  Service: Endoscopy;   Laterality: N/A;  ? KNEE SURGERY    ? LAPAROSCOPY    ? laproscopic abdominal    ? [place] IMPEDANCE STUDY N/A 11/04/2015  ? Procedure: [place] IMPEDANCE STUDY;  Surgeon: Laurence Spates, MD;  Location: [facility];  Service: Endoscopy;  Laterality: N/A;  ? VAGINAL HYSTERECTOMY  2010  ? [facility]--ovaries remain  ? ? ?Prior to Admission medications   ?Medication Sig Start Date End Date Taking? Authorizing Provider  ?albuterol (VENTOLIN HFA) 108 (90 Base) MCG/ACT inhaler Inhale 2 puffs into the lungs every 6 (six) hours as needed for wheezing or shortness of breath.   Yes [provider]  ?[place] 575 MG/5ML SYRP Take 30 mLs by mouth daily.   Yes [provider]  ?famotidine (PEPCID) 20 MG tablet Take 1 tablet (20 mg total) by mouth at bedtime. 01/28/21  Yes   ?Lactobacillus Casei-Folic Acid (RESTORA RX) 60-1.25 MG CAPS Take 1 capsule by mouth daily between meals 12/22/20  Yes   ?levocetirizine (XYZAL) 5 MG tablet Take 1 tablet (5 mg total) by mouth every evening. ?Patient taking differently: Take 5 mg by mouth daily as needed for allergies. 04/25/17  Yes Bobbitt, Sedalia Muta, MD  ?Lifitegrast Shirley Friar) 5 % SOLN Place 1 drop into both eyes 2 (two) times daily. 12/21/20  Yes   ?lubiprostone (AMITIZA) 24 MCG capsule Take 1 capsule (24 mcg total) by mouth 2 (two) times daily with a meal. ?Patient taking differently: Take 24 mcg by mouth as directed. Alternate Between Taking 1 tablet (24 mcg) Daily & 1 tablet (24 mcg) BID 03/30/21  Yes   ?polyethylene glycol (MIRALAX / GLYCOLAX) 17 g packet Take 17 g by mouth every other day.   Yes [provider]  ?albuterol (VENTOLIN HFA) 108 (90 Base) MCG/ACT inhaler INHALE 1 PUFF BY MOUTH EVERY 4-6 HRS 12/13/19 12/12/20  Leeroy Cha, MD  ? ? ?Scheduled Meds: ? enoxaparin (LOVENOX) injection  40 mg Subcutaneous Q24H  ? famotidine  20 mg Oral QHS  ? ?Continuous Infusions: ? lactated ringers 125 mL/hr at 04/05/21 0519  ? ?PRN Meds:.albuterol, ondansetron **OR**  ondansetron (ZOFRAN) IV ? ?Allergies as of 04/04/2021 - Review Complete 04/04/2021  ?Allergen Reaction Noted  ? Molds & smuts Cough 02/22/2017  ? ? ?Family History  ?Problem Relation Age of Onset  ? Colon cancer Father 7  ? Hypertension Father   ? Stroke Father   ? Heart disease Father   ? Heart attack Father   ? Hyperlipidemia Father   ? Colon cancer Mother 22  ? Diabetes Maternal Uncle   ? Stroke Paternal Grandmother   ? Heart attack Paternal Grandmother   ? Heart attack Maternal Grandmother   ? Heart attack Maternal Grandfather   ? Cancer Maternal Uncle   ? Hypertension Sister   ? Diabetes Brother   ? Breast cancer Neg Hx   ? ? ?Social History  ? ?Socioeconomic History  ? Marital status: Married  ?  Spouse name: Jeneen Rinks  ? Number of children: 2  ? Years of education:  Not on file  ? Highest education level: Not on file  ?Occupational History  ? Occupation: Armed forces operational officer  ?Tobacco Use  ? Smoking status: Never  ? Smokeless tobacco: Never  ?Vaping Use  ? Vaping Use: Never used  ?Substance and Sexual Activity  ? Alcohol use: Not Currently  ? Drug use: No  ? Sexual activity: Yes  ?  Partners: Male  ?  Birth control/protection: None, Surgical  ?  Comment: [facility]-ovaries remain  ?Other Topics Concern  ? Not on file  ?Social History Narrative  ? Not on file  ? ?Social Determinants of Health  ? ?Financial Resource Strain: Not on file  ?Food Insecurity: No Food Insecurity  ? Worried About Charity fundraiser in the Last Year: Never true  ? Ran Out of Food in the Last Year: Never true  ?Transportation Needs: Not on file  ?Physical Activity: Not on file  ?Stress: Not on file  ?Social Connections: Not on file  ?Intimate Partner Violence: Not on file  ? ? ?Review of Systems: Review of Systems  ?Constitutional:  Negative for chills, fever and weight loss.  ?HENT:  Negative for hearing loss and tinnitus.   ?Eyes:  Negative for blurred vision and double vision.  ?Respiratory:  Negative for cough and hemoptysis.   ?Cardiovascular:   Negative for chest pain and palpitations.  ?Gastrointestinal:  Positive for abdominal pain, constipation, diarrhea and nausea. Negative for blood in stool, heartburn, melena and vomiting.  ?Genitourinary:  Negative

## 2021-04-06 LAB — COMPREHENSIVE METABOLIC PANEL
ALT: 453 U/L — ABNORMAL HIGH (ref 0–44)
AST: 168 U/L — ABNORMAL HIGH (ref 15–41)
Albumin: 3.1 g/dL — ABNORMAL LOW (ref 3.5–5.0)
Alkaline Phosphatase: 120 U/L (ref 38–126)
Anion gap: 3 — ABNORMAL LOW (ref 5–15)
BUN: 6 mg/dL (ref 6–20)
CO2: 26 mmol/L (ref 22–32)
Calcium: 8.6 mg/dL — ABNORMAL LOW (ref 8.9–10.3)
Chloride: 111 mmol/L (ref 98–111)
Creatinine, Ser: 0.6 mg/dL (ref 0.44–1.00)
GFR, Estimated: >60 mL/min (ref >60)
Glucose, Bld: 100 mg/dL — ABNORMAL HIGH (ref 70–99)
Potassium: 4 mmol/L (ref 3.5–5.1)
Sodium: 140 mmol/L (ref 135–145)
Total Bilirubin: 1.3 mg/dL — ABNORMAL HIGH (ref 0.3–1.2)
Total Protein: 6.1 g/dL — ABNORMAL LOW (ref 6.5–8.1)

## 2021-04-06 LAB — CBC WITH DIFFERENTIAL/PLATELET
Abs Immature Granulocytes: 0.02 10*3/uL (ref 0.00–0.07)
Basophils Absolute: 0 10*3/uL (ref 0.0–0.1)
Basophils Relative: 0 %
Eosinophils Absolute: 0.2 10*3/uL (ref 0.0–0.5)
Eosinophils Relative: 2 %
HCT: 32.5 % — ABNORMAL LOW (ref 36.0–46.0)
Hemoglobin: 10.4 g/dL — ABNORMAL LOW (ref 12.0–15.0)
Immature Granulocytes: 0 %
Lymphocytes Relative: 25 %
Lymphs Abs: 1.6 10*3/uL (ref 0.7–4.0)
MCH: 32.8 pg (ref 26.0–34.0)
MCHC: 32 g/dL (ref 30.0–36.0)
MCV: 102.5 fL — ABNORMAL HIGH (ref 80.0–100.0)
Monocytes Absolute: 0.6 10*3/uL (ref 0.1–1.0)
Monocytes Relative: 10 %
Neutro Abs: 3.9 10*3/uL (ref 1.7–7.7)
Neutrophils Relative %: 63 %
Platelets: 138 10*3/uL — ABNORMAL LOW (ref 150–400)
RBC: 3.17 MIL/uL — ABNORMAL LOW (ref 3.87–5.11)
RDW: 12.3 % (ref 11.5–15.5)
WBC: 6.2 10*3/uL (ref 4.0–10.5)
nRBC: 0 % (ref 0.0–0.2)

## 2021-04-06 LAB — FOLATE: Folate: 21.4 ng/mL (ref >5.9)

## 2021-04-06 LAB — ANTI-SMOOTH MUSCLE ANTIBODY, IGG: F-Actin IgG: 7 Units (ref 0–19)

## 2021-04-06 LAB — MITOCHONDRIAL ANTIBODIES: Mitochondrial M2 Ab, IgG: <20 Units (ref 0.0–20.0)

## 2021-04-06 LAB — VITAMIN B12: Vitamin B-12: 957 pg/mL — ABNORMAL HIGH (ref 180–914)

## 2021-04-06 LAB — CERULOPLASMIN: Ceruloplasmin: 24.3 mg/dL (ref 19.0–39.0)

## 2021-04-06 LAB — ALPHA-1-ANTITRYPSIN: A-1 Antitrypsin, Ser: 149 mg/dL (ref 101–187)

## 2021-04-06 MED ORDER — HYDROMORPHONE HCL 1 MG/ML IJ SOLN
0.5000 mg | INTRAMUSCULAR | Status: AC | PRN
  Administered 2021-04-06 – 2021-04-07 (×2): 0.5 mg via INTRAVENOUS
  Filled 2021-04-06 (×2): qty 0.5

## 2021-04-06 MED ORDER — HYDROMORPHONE HCL 1 MG/ML IJ SOLN
0.5000 mg | Freq: Once | INTRAMUSCULAR | Status: AC
  Administered 2021-04-06: 0.5 mg via INTRAVENOUS
  Filled 2021-04-06: qty 0.5

## 2021-04-06 NOTE — Progress Notes (Signed)
?  Transition of Care (TOC) Screening Note ? ? ?Patient Details  ?Name: Karen [name] ?Date of Birth: 01-18-1970 ? ? ?Transition of Care [facility]) CM/SW Contact:    ?Dessa Phi, RN ?Phone Number: ?04/06/2021, 9:18 AM ? ? ? ?Transition of Care Department [facility]) has reviewed patient and no TOC needs have been identified at this time. We will continue to monitor patient advancement through interdisciplinary progression rounds. If new patient transition needs arise, please place a TOC consult. ?  ?

## 2021-04-06 NOTE — Progress Notes (Addendum)
[facility] Progress Note ? ?Karen [name] 51 y.o. 07-18-1970 ? ?CC:   Abdominal pain, elevated LFTs ?  ? ?Subjective: ?Patient seen and examined in bed. She notes she had one small bowel movement yesterday, but she has continued abdominal pain. She is nauseated but has been able to drink her full liquids today.   ? ?ROS : Review of Systems  ?Constitutional:  Negative for chills and fever.  ?Gastrointestinal:  Positive for abdominal pain, constipation and nausea. Negative for blood in stool, diarrhea, heartburn, melena and vomiting.  ?Genitourinary:  Negative for dysuria and urgency.  ?Skin:  Negative for rash.  ?Neurological:  Negative for dizziness and headaches.  ? ? ? ?Objective: ?Vital signs in last 24 hours: ?Vitals:  ? 04/06/21 0952 04/06/21 1227  ?BP: 109/70 124/79  ?Pulse: 63 72  ?Resp: 16 16  ?Temp: 98.1 ?F (36.7 ?C) 98 ?F (36.7 ?C)  ?SpO2: 98% 100%  ? ? ?Physical Exam: ? ?General:  Alert, cooperative, no distress, appears stated age  ?Head:  Normocephalic, without obvious abnormality, atraumatic  ?Eyes:  Anicteric sclera, EOM's intact  ?Lungs:   Clear to auscultation bilaterally, respirations unlabored  ?Heart:  Regular rate and rhythm, S1, S2 normal  ?Abdomen:   Soft, generalized abdominal pain to palpation with guarding, bowel sounds active all four quadrants,  no masses,   ?Extremities: Extremities normal, atraumatic, no  edema  ?Pulses: 2+ and symmetric  ? ? ?Lab Results: ?Recent Labs  ?  04/05/21 ?0253 04/06/21 ?0407  ?NA 137 140  ?K 3.2* 4.0  ?CL 106 111  ?CO2 27 26  ?GLUCOSE 94 100*  ?BUN 8 6  ?CREATININE 0.63 0.60  ?CALCIUM 8.5* 8.6*  ? ?Recent Labs  ?  04/05/21 ?0253 04/06/21 ?0407  ?AST 562* 168*  ?ALT 727* 453*  ?ALKPHOS 127* 120  ?BILITOT 2.8* 1.3*  ?PROT 6.1* 6.1*  ?ALBUMIN 3.2* 3.1*  ? ?Recent Labs  ?  04/05/21 ?0253 04/06/21 ?0407  ?WBC 9.6 6.2  ?NEUTROABS  --  3.9  ?HGB 11.0* 10.4*  ?HCT 34.1* 32.5*  ?MCV 101.8* 102.5*  ?PLT 163 138*  ? ?No results for input(s): LABPROT, INR  in the last 72 hours. ? ? ? ?Assessment ?Transaminitis ?Colitis  ? ?HGB 10.4(11.0) Platelets 138(163) ?AST [id number]) ALT [id number])  ?Alkphos 709(628) TBili 1.3(2.8) ? 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 Normal RUQ [place].  ? ?Pending ASMA and AMA.  ? ?LFTs are down trending  ? ?Doubt inflammatory bowel disease or infectious colitis. Manage conservatively. Hold off on laxatives since presented with diarrhea on Amitiza and has a history of CIC and overflow incontinence. Supportive care. Continue full liquid diet today. ? ?Plan: ?Hold off on laxatives since presented with diarrhea on Amitiza and has a history of CIC and overflow incontinence.  ?Monitor for ASMA and AMA lab results  ?Supportive care.  ?Continue full liquid diet today. ?[facility] will follow  ? ?Charlott Rakes PA-C ?04/06/2021, 2:27 PM ? ?Contact #  [phone]  ?

## 2021-04-06 NOTE — Progress Notes (Signed)
IBS ?PROGRESS NOTE ? ?Karen [name]  ?DOB: 1970/08/30  ?PCP: Leeroy Cha, MD ?JOI:[id number]  ?DOA: 04/04/2021 ? LOS: 1 day  ?Hospital Day: 3 ? ?Brief narrative: ?Karen [name] is a 51 y.o. female with PMH significant for chronic abdominal pain, IBS, GERD with esophagitis, history of Schatzki's ring s/p dilation, SIBO s/p treatment in 04/2019, endometriosis status post hysterectomy, chronic anemia, history of DVT in the past currently not on anticoagulation.  She follows up with [facility] for chronic abdominal pain and does bowel cleanses for chronic constipation.  She has been on Amitiza for over a year for alternate constipation and diarrhea. ?On 04/04/2021, patient presented to [place] office with fever, diarrhea, severe abdominal pain, nausea and vomiting.  She was sent to the ED for further evaluation and management. ?Initial work-up, she was also noted to have elevated liver enzymes. ?CT abdomen showed long segment mild wall thickening involving the ascending and transverse colon suggestive of mild colitis ?Admitted to hospitalist service ?GI following ? ?Subjective: ?Patient was seen and examined this morning. ?Pleasant middle-aged African-American female.  Sitting up in bed.  Not in distress.  Family at bedside. ?Currently on liquid diet. ? ?Principal Problem: ?  Elevated LFTs ?Active Problems: ?  Colitis ?  ? ?Assessment and Plan: ?Abdominal pain/colitis ?History of chronic abdominal pain, GERD, IBS ?-Presented with intractable abdomen pain, nausea, vomiting.   ?-CT abdomen showed finding of mild colitis in the transverse and ascending colon ?-GI following. ?-Currently not on antibiotics. ?-Per GI, hold off on laxatives because patient presented with diarrhea on Amitiza and has a history of CIC and overflow incontinence. ?-Currently on full liquid diet. ? ?Elevated LFTs, unclear etiology ?-Suspect autoimmune hepatitis.  Autoimmune markers pending. ?-Viral hepatitis panel negative.   ?-Right  upper quadrant ultrasound with no evidence of biliary duct dilatation, normal parenchymal echogenicity. ?-Continue to trend LFTs. ?Recent Labs  ?Lab 04/04/21 ?1522 04/05/21 ?0253 04/06/21 ?0407  ?AST 1,044* 562* 168*  ?ALT 754* 727* 453*  ?ALKPHOS 117 127* 120  ?BILITOT 1.8* 2.8* 1.3*  ?PROT 7.5 6.1* 6.1*  ?ALBUMIN 4.1 3.2* 3.1*  ? ?GERD, esophagitis ?Continue home Pepcid ? ?History of endometriosis ?-Underwent hysterectomy several years ago ? ?History of DVT ?-Was on Coumadin for 6 months in the past.  Currently on Lovenox shots. ? ?Macrocytosis ?-MCV 100 with mild anemia, and normal Vitamin B12 and folate level. ?-Probably due to underlying liver disease. ?  ?Goals of care ?  Code Status: Full Code  ? ? ?Mobility: Encourage ambulation ? ?Nutritional status:  ?Body mass index is 31.8 kg/m?.  ?  ?  ? ? ? ? ?Diet:  ?Diet Order   ? ?       ?  Diet full liquid Room service appropriate? Yes; Fluid consistency: Thin  Diet effective now       ?  ? ?  ?  ? ?  ? ? ?DVT prophylaxis:  ?enoxaparin (LOVENOX) injection 40 mg Start: 04/04/21 2300 ?  ?Antimicrobials: None ?Fluid: NS at 75 mill per hour ?Consultants: GI ?Family Communication: Husband and daughter at bedside ? ?Status is: Inpatient ? ?Continue in-hospital care because: Pending work-up ?Level of care: Med-Surg  ? ?Dispo: The patient is from: Home ?             Anticipated d/c is to: Hopefully home in 1 to 2 days ?             Patient currently is not medically stable to  d/c. ?  Difficult to place patient No ? ? ? ? ?Infusions:  ? sodium chloride 75 mL/hr at 04/06/21 0353  ? ? ?Scheduled Meds: ? enoxaparin (LOVENOX) injection  40 mg Subcutaneous Q24H  ? famotidine  20 mg Oral QHS  ? ? ?PRN meds: ?albuterol, ondansetron **OR** ondansetron (ZOFRAN) IV  ? ?Antimicrobials: ?Anti-infectives (From admission, onward)  ? ? None  ? ?  ? ? ?Objective: ?Vitals:  ? 04/06/21 3154 04/06/21 1227  ?BP: 109/70 124/79  ?Pulse: 63 72  ?Resp: 16 16  ?Temp: 98.1 ?F (36.7 ?C) 98 ?F (36.7  ?C)  ?SpO2: 98% 100%  ? ? ?Intake/Output Summary (Last 24 hours) at 04/06/2021 1507 ?Last data filed at 04/06/2021 1459 ?Gross per 24 hour  ?Intake 1500 ml  ?Output 5250 ml  ?Net -3750 ml  ? ?Filed Weights  ? 04/04/21 2105  ?Weight: 92.1 kg  ? ?Weight change:  ?Body mass index is 31.8 kg/m?.  ? ?Physical Exam: ?General exam: Pleasant, middle-aged African-American female.  Not in physical distress ?Skin: No rashes, lesions or ulcers. ?HEENT: Atraumatic, normocephalic, no obvious bleeding ?Lungs: Clear to auscultation bilaterally ?CVS: Regular rate and rhythm, no murmur ?GI/Abd soft, mild diffuse tenderness, nondistended, bowel sound present ?CNS: Alert, awake, oriented x3 ?Psychiatry: Mood appropriate ?Extremities: No pedal edema, no calf tenderness ? ?Data Review: I have personally reviewed the laboratory data and studies available. ? ?F/u labs ordered ?Unresulted Labs (From admission, onward)  ? ?  Start     Ordered  ? 04/07/21 0500  CBC with Differential/Platelet  Daily,   R     ? 04/06/21 0759  ? 04/07/21 0500  Comprehensive metabolic panel  Daily,   R     ? 04/06/21 0759  ? 04/05/21 1511  Mitochondrial antibodies  (Mitochondiral/Smooth Muscle AB PNL (PNL))  Once,   R       ? 04/05/21 1511  ? 04/05/21 1511  Anti-smooth muscle antibody, IgG  (Mitochondiral/Smooth Muscle AB PNL (PNL))  Once,   R       ? 04/05/21 1511  ? ?  ?  ? ?  ? ? ?Signed, ?Terrilee Croak, MD ?[facility] ?04/06/2021 ? ? ? ? ? ? ? ? ? ? ? ? ?

## 2021-04-07 LAB — CBC WITH DIFFERENTIAL/PLATELET
Abs Immature Granulocytes: 0.01 10*3/uL (ref 0.00–0.07)
Basophils Absolute: 0 10*3/uL (ref 0.0–0.1)
Basophils Relative: 1 %
Eosinophils Absolute: 0.2 10*3/uL (ref 0.0–0.5)
Eosinophils Relative: 4 %
HCT: 35.1 % — ABNORMAL LOW (ref 36.0–46.0)
Hemoglobin: 11.1 g/dL — ABNORMAL LOW (ref 12.0–15.0)
Immature Granulocytes: 0 %
Lymphocytes Relative: 37 %
Lymphs Abs: 1.9 10*3/uL (ref 0.7–4.0)
MCH: 32.7 pg (ref 26.0–34.0)
MCHC: 31.6 g/dL (ref 30.0–36.0)
MCV: 103.5 fL — ABNORMAL HIGH (ref 80.0–100.0)
Monocytes Absolute: 0.6 10*3/uL (ref 0.1–1.0)
Monocytes Relative: 11 %
Neutro Abs: 2.4 10*3/uL (ref 1.7–7.7)
Neutrophils Relative %: 47 %
Platelets: 156 10*3/uL (ref 150–400)
RBC: 3.39 MIL/uL — ABNORMAL LOW (ref 3.87–5.11)
RDW: 12.2 % (ref 11.5–15.5)
WBC: 5.1 10*3/uL (ref 4.0–10.5)
nRBC: 0 % (ref 0.0–0.2)

## 2021-04-07 LAB — COMPREHENSIVE METABOLIC PANEL
ALT: 309 U/L — ABNORMAL HIGH (ref 0–44)
AST: 60 U/L — ABNORMAL HIGH (ref 15–41)
Albumin: 3.3 g/dL — ABNORMAL LOW (ref 3.5–5.0)
Alkaline Phosphatase: 122 U/L (ref 38–126)
Anion gap: 6 (ref 5–15)
BUN: 6 mg/dL (ref 6–20)
CO2: 26 mmol/L (ref 22–32)
Calcium: 8.8 mg/dL — ABNORMAL LOW (ref 8.9–10.3)
Chloride: 107 mmol/L (ref 98–111)
Creatinine, Ser: 0.7 mg/dL (ref 0.44–1.00)
GFR, Estimated: >60 mL/min (ref >60)
Glucose, Bld: 95 mg/dL (ref 70–99)
Potassium: 3.7 mmol/L (ref 3.5–5.1)
Sodium: 139 mmol/L (ref 135–145)
Total Bilirubin: 0.8 mg/dL (ref 0.3–1.2)
Total Protein: 6.4 g/dL — ABNORMAL LOW (ref 6.5–8.1)

## 2021-04-07 MED ORDER — OXYCODONE HCL 5 MG PO TABS
5.0000 mg | ORAL_TABLET | ORAL | Status: DC | PRN
  Administered 2021-04-07 – 2021-04-08 (×3): 5 mg via ORAL
  Filled 2021-04-07 (×3): qty 1

## 2021-04-07 MED ORDER — HYDROMORPHONE HCL 1 MG/ML IJ SOLN: 0.5000 mg | INTRAMUSCULAR | Status: DC | PRN

## 2021-04-07 NOTE — Progress Notes (Signed)
?PROGRESS NOTE ? ? ? ?Karen [name]  UTM:[id number] DOB: 1970/03/15 DOA: 04/04/2021 ?PCP: Leeroy Cha, MD  ? ? ?Brief Narrative:  ?Karen [name] is a 51 year old female with past medical history significant for chronic abdominal pain, IBS, GERD with esophagitis, history of Schatzki's ring s/p dilation, SIBO s/p treatment 04/2019, endometriosis s/p hysterectomy, chronic anemia, history of DVT currently not on anticoagulation who presented to [facility] ED on 4/2 with severe abdominal pain associated with nausea and vomiting.  Patient was sent to the ED for further evaluation by her gastroenterology office. ? ?Patient follows with [facility] gastroenterology and [facility] gastroenterology for chronic abdominal pain and does bowel cleanses for chronic constipation.  She has been on Amitiza for over a year for IBS. ? ?In the ED, patient was noted to have elevated liver enzymes.  CT abdomen showing long segment mild wall thickening involving the ascending/transverse colon suggestive of mild colitis.  Gastroenterology was consulted.  Hospital service consulted for further evaluation and admission. ? ?Assessment & Plan: ? ?Colitis ?Transaminitis ?Patient presenting to the ED by direction of her gastroenterologist for severe abdominal pain with associated intractable nausea/vomiting.  Patient was afebrile without leukocytosis.  CT abdomen with findings of mild colitis in the transverse and ascending colon.  Patient with history of chronic abdominal pain, GERD, IBS. ?--[facility] following, appreciate assistance ?--AST 1044>>168>60 ?--ALT 754>>453>309 ?--Tbili 1.8>2.8>1.3>0.8 ?--GI doubts inflammatory or infectious colitis ?--Autoimmune work-up including alpha-1 antitrypsin, ASMA, mitochondrial antibodies negative/within normal limits ?--Ceruloplasmin 24.3, normal ?--Ferritin 1466 ?--NS at 75 mL/h ?--Antiemetics ?--Await further recommendations per gastroenterology ?--CMP daily ? ?GERD ?Esophagitis ?--Continue home Pepcid ? ?History  of DVT ?Previously on Coumadin for 6 months, off of anticoagulation now. ? ? ? ?DVT prophylaxis: enoxaparin (LOVENOX) injection 40 mg Start: 04/04/21 2300 ? ?  Code Status: Full Code ?Family Communication: Updated family present at bedside this morning. ? ?Disposition Plan:  ?Level of care: Med-Surg ?Status is: Inpatient ?Remains inpatient appropriate because: IV fluid hydration, poor toleration of soft diet currently, await further recommendations per gastroenterology, will need to tolerate at least a soft diet prior to safe discharge ?  ? ?Consultants:  ?[facility] ? ?Procedures:  ?None ? ?Antimicrobials:  ?None ? ? ?Subjective: ?Patient seen examined bedside, resting comfortably.  Sitting at edge of bed.  Family present.  Reports "poor night".  Continues with abdominal discomfort, nausea after advancement of diet to soft yesterday.  Denies any vomiting.  Asking questions regarding her liver function tests and autoimmune work-up.  Discussed with patient and family at bedside that autoimmune work-up has been unrevealing and that LFTs continue to trend down.  No other specific questions or concerns at this time.  Await further GI recommendations.  Denies headache, no dizziness, no chest pain, no shortness of breath, no vomiting, diarrhea ? ?Objective: ?Vitals:  ? 04/06/21 0952 04/06/21 1227 04/06/21 2147 04/07/21 0532  ?BP: 109/70 124/79 128/78 119/76  ?Pulse: 63 72 60 67  ?Resp: '16 16 18 18  '$ ?Temp: 98.1 ?F (36.7 ?C) 98 ?F (36.7 ?C) 98.3 ?F (36.8 ?C) 97.6 ?F (36.4 ?C)  ?TempSrc: Oral Oral Oral Oral  ?SpO2: 98% 100% 98% 100%  ?Weight:      ?Height:      ? ? ?Intake/Output Summary (Last 24 hours) at 04/07/2021 0945 ?Last data filed at 04/07/2021 0700 ?Gross per 24 hour  ?Intake 3269.99 ml  ?Output 6100 ml  ?Net -2830.01 ml  ? ?Filed Weights  ? 04/04/21 2105  ?Weight: 92.1 kg  ? ? ?Examination: ? ?  Physical Exam: ?GEN: NAD, alert and oriented x 3, wd/wn ?HEENT: NCAT, PERRL, EOMI, sclera clear, MMM ?PULM: CTAB  w/o wheezes/crackles, normal respiratory effort, on room air ?CV: RRR w/o M/G/R ?GI: abd soft, nondistended, diffuse tenderness to palpation throughout abdomen, faint bowel sounds, slight guarding, no rebound/masses ?MSK: no peripheral edema, muscle strength globally intact 5/5 bilateral upper/lower extremities ?NEURO: CN II-XII intact, no focal deficits, sensation to light touch intact ?PSYCH: normal mood/affect ?Integumentary: dry/intact, no rashes or wounds ? ? ? ?Data Reviewed: I have personally reviewed following labs and imaging studies ? ?CBC: ?Recent Labs  ?Lab 04/04/21 ?1522 04/05/21 ?0253 04/06/21 ?0407 04/07/21 ?0359  ?WBC 12.0* 9.6 6.2 5.1  ?NEUTROABS  --   --  3.9 2.4  ?HGB 12.7 11.0* 10.4* 11.1*  ?HCT 38.5 34.1* 32.5* 35.1*  ?MCV 98.7 101.8* 102.5* 103.5*  ?PLT 177 163 138* 156  ? ?Basic Metabolic Panel: ?Recent Labs  ?Lab 04/04/21 ?1522 04/05/21 ?0253 04/06/21 ?0407 04/07/21 ?0359  ?NA 137 137 140 139  ?K 3.7 3.2* 4.0 3.7  ?CL 102 106 111 107  ?CO2 '25 27 26 26  '$ ?GLUCOSE 120* 94 100* 95  ?BUN '13 8 6 6  '$ ?CREATININE 0.83 0.63 0.60 0.70  ?CALCIUM 9.5 8.5* 8.6* 8.8*  ? ?GFR: ?Estimated Creatinine Clearance: 95.8 mL/min (by C-G formula based on SCr of 0.7 mg/dL). ?Liver Function Tests: ?Recent Labs  ?Lab 04/04/21 ?1522 04/05/21 ?0253 04/06/21 ?0407 04/07/21 ?0359  ?AST 1,044* 562* 168* 60*  ?ALT 754* 727* 453* 309*  ?ALKPHOS 117 127* 120 122  ?BILITOT 1.8* 2.8* 1.3* 0.8  ?PROT 7.5 6.1* 6.1* 6.4*  ?ALBUMIN 4.1 3.2* 3.1* 3.3*  ? ?Recent Labs  ?Lab 04/04/21 ?1522  ?LIPASE 31  ? ?No results for input(s): AMMONIA in the last 168 hours. ?Coagulation Profile: ?No results for input(s): INR, PROTIME in the last 168 hours. ?Cardiac Enzymes: ?No results for input(s): CKTOTAL, CKMB, CKMBINDEX, TROPONINI in the last 168 hours. ?BNP (last 3 results) ?No results for input(s): PROBNP in the last 8760 hours. ?HbA1C: ?No results for input(s): HGBA1C in the last 72 hours. ?CBG: ?No results for input(s): GLUCAP in the last 168  hours. ?Lipid Profile: ?No results for input(s): CHOL, HDL, LDLCALC, TRIG, CHOLHDL, LDLDIRECT in the last 72 hours. ?Thyroid Function Tests: ?No results for input(s): TSH, T4TOTAL, FREET4, T3FREE, THYROIDAB in the last 72 hours. ?Anemia Panel: ?Recent Labs  ?  04/05/21 ?1534 04/06/21 ?0407  ?[id number]  --  062*  ?FOLATE  --  21.4  ?FERRITIN 1,466*  --   ?TIBC 246*  --   ?IRON 123  --   ? ?Sepsis Labs: ?No results for input(s): PROCALCITON, LATICACIDVEN in the last 168 hours. ? ?No results found for this or any previous visit (from the past 240 hour(s)).  ? ? ? ? ? ?Radiology Studies: ?US Abdomen Limited RUQ (LIVER/GB) ? ?Result Date: 04/05/2021 ?CLINICAL DATA:  Abdominal pain.  Prior cholecystectomy. EXAM: ULTRASOUND ABDOMEN LIMITED RIGHT UPPER QUADRANT COMPARISON:  CT of yesterday FINDINGS: Gallbladder: Surgically absent Common bile duct: Diameter: Normal, 2 mm. Liver: No focal lesion identified. Within normal limits in parenchymal echogenicity. Portal vein is patent on color Doppler imaging with normal direction of blood flow towards the liver. Other: None. IMPRESSION: Cholecystectomy. No biliary duct dilatation or explanation for abdominal pain. Electronically Signed   By: Abigail Miyamoto M.D.   On: 04/05/2021 11:53   ? ? ? ? ? ?Scheduled Meds: ? enoxaparin (LOVENOX) injection  40 mg Subcutaneous Q24H  ? famotidine  20 mg Oral QHS  ? ?Continuous Infusions: ? sodium chloride 75 mL/hr at 04/06/21 2002  ? ? ? LOS: 2 days  ? ? ?Time spent: 52 minutes spent on chart review, discussion with nursing staff, consultants, updating family and interview/physical exam; more than 50% of that time was spent in counseling and/or coordination of care. ? ? ? ?Gwynevere Lizana J [place] DO ?[facility] ?Available via Epic secure chat 7am-7pm ?After these hours, please refer to coverage provider listed on [email or web address] ?04/07/2021, 9:45 AM  ? ?

## 2021-04-07 NOTE — Consult Note (Signed)
? ?  [facility] CM Inpatient Consult ? ? ?04/07/2021 ? ?Karen [name] ?1970-06-18 ?[id number] ? ? [facility] [ACO] Patient: [name] plan ? ?Primary Care Provider:  Leeroy Cha, MD, [facility] Physician at [facility], this is an Embedded provider with a Chronic Care Management program and team.  This provider is listed for the [facility] calls and follow up ? ?Referral for [facility] plan for disease management and community resource support can be sent for post hospital needs.   ?    ? Plan: Patient will be followed disposition and support needs. ?  ?For additional questions or referrals please contact: ?  ?Natividad Brood, RN BSN CCM ?[facility] Liaison ? [phone] business mobile phone ?Toll free office [phone]  ?Fax number: [phone] ?[email or web address] ? ? ?

## 2021-04-07 NOTE — Progress Notes (Signed)
[facility] Progress Note ? ?Karen [name] 51 y.o. [birth year] ? ?CC:  abdominal pain, elevated LFTs ? ? ?Subjective: ?Patient examined laying in bed. She noted one small bowel movement this AM which was well formed and not loose. She continues to have some abdominal pain and nausea. She is drinking clears and will attempt full liquids.  ? ?ROS : Review of Systems  ?Constitutional:  Negative for chills and fever.  ?HENT:  Negative for hearing loss.   ?Cardiovascular:  Negative for chest pain.  ?Gastrointestinal:  Positive for abdominal pain, constipation and nausea. Negative for blood in stool, diarrhea, heartburn, melena and vomiting.  ?Genitourinary:  Negative for dysuria and urgency.  ? ? ? ?Objective: ?Vital signs in last 24 hours: ?Vitals:  ? 04/06/21 2147 04/07/21 0532  ?BP: 128/78 119/76  ?Pulse: 60 67  ?Resp: 18 18  ?Temp: 98.3 ?F (36.8 ?C) 97.6 ?F (36.4 ?C)  ?SpO2: 98% 100%  ? ? ?Physical Exam: ? ?General:  Alert, cooperative, no distress, appears stated age  ?Head:  Normocephalic, without obvious abnormality, atraumatic  ?Eyes:  Anicteric sclera, EOM's intact  ?Lungs:   Clear to auscultation bilaterally, respirations unlabored  ?Heart:  Regular rate and rhythm, S1, S2 normal  ?Abdomen:   Soft, generalized abdominal tenderness to palpation, bowel sounds active all four quadrants,  no masses,   ?Extremities: Extremities normal, atraumatic, no  edema  ?Pulses: 2+ and symmetric  ? ? ?Lab Results: ?Recent Labs  ?  04/06/21 ?0407 04/07/21 ?1694  ?NA 140 139  ?K 4.0 3.7  ?CL 111 107  ?CO2 26 26  ?GLUCOSE 100* 95  ?BUN 6 6  ?CREATININE 0.60 0.70  ?CALCIUM 8.6* 8.8*  ? ?Recent Labs  ?  04/06/21 ?0407 04/07/21 ?5038  ?AST 168* 60*  ?ALT 453* 309*  ?ALKPHOS 120 122  ?BILITOT 1.3* 0.8  ?PROT 6.1* 6.4*  ?ALBUMIN 3.1* 3.3*  ? ?Recent Labs  ?  04/06/21 ?0407 04/07/21 ?8828  ?WBC 6.2 5.1  ?NEUTROABS 3.9 2.4  ?HGB 10.4* 11.1*  ?HCT 32.5* 35.1*  ?MCV 102.5* 103.5*  ?PLT 138* 156  ? ?No results for input(s):  LABPROT, INR in the last 72 hours. ? ? ? ?Assessment ?Transaminitis ?Colitis  ?  ?HGB 11.1(10.4) Platelets 156(138) ?AST 60(168) ALT [id number])  ?Alkphos [id number]) TBili 0.8(1.3) ?  ?Suspect autoimmune hepatitis; Viral hepatitis panel negative; normal Alpha 1 antitrypsin, ceruloplasmin, ASMA, AMA. Normal RUQ [place].  ? ?Ferritin elevated at 1,466, awaiting hereditary hemochromatosis genetic testing.  ?Iron 123 ?TIBC 246 ?% saturation 50% ?  ?LFTs are down trending, given elevated Ferritin suspect secondary iron overload but will check genetic test for Hereditary Hemochromatosis.  ?  ?Doubt inflammatory bowel disease or infectious colitis. Manage conservatively. Hold off on laxatives since presented with diarrhea on Amitiza and has a history of CIC and overflow incontinence.  ? ?Plan: ?Hold off on laxatives since presented with diarrhea on Amitiza and has a history of CIC and overflow incontinence.  ?Awaiting Hereditary hemochromatosis genetic testing ?Supportive care.  ?Continue full liquid diet today. ?[facility] will follow  ?  ? ?Charlott Rakes PA-C ?04/07/2021, 10:25 AM ? ?Contact #  [phone] ? ?

## 2021-04-08 ENCOUNTER — Other Ambulatory Visit (HOSPITAL_COMMUNITY): Payer: Self-pay

## 2021-04-08 LAB — COMPREHENSIVE METABOLIC PANEL
ALT: 234 U/L — ABNORMAL HIGH (ref 0–44)
AST: 41 U/L (ref 15–41)
Albumin: 3.3 g/dL — ABNORMAL LOW (ref 3.5–5.0)
Alkaline Phosphatase: 107 U/L (ref 38–126)
Anion gap: 6 (ref 5–15)
BUN: 6 mg/dL (ref 6–20)
CO2: 24 mmol/L (ref 22–32)
Calcium: 8.6 mg/dL — ABNORMAL LOW (ref 8.9–10.3)
Chloride: 108 mmol/L (ref 98–111)
Creatinine, Ser: 0.68 mg/dL (ref 0.44–1.00)
GFR, Estimated: >60 mL/min (ref >60)
Glucose, Bld: 89 mg/dL (ref 70–99)
Potassium: 4.1 mmol/L (ref 3.5–5.1)
Sodium: 138 mmol/L (ref 135–145)
Total Bilirubin: 1.3 mg/dL — ABNORMAL HIGH (ref 0.3–1.2)
Total Protein: 6.4 g/dL — ABNORMAL LOW (ref 6.5–8.1)

## 2021-04-08 LAB — CBC WITH DIFFERENTIAL/PLATELET
Abs Immature Granulocytes: 0.01 10*3/uL (ref 0.00–0.07)
Basophils Absolute: 0 10*3/uL (ref 0.0–0.1)
Basophils Relative: 1 %
Eosinophils Absolute: 0.3 10*3/uL (ref 0.0–0.5)
Eosinophils Relative: 5 %
HCT: 38.5 % (ref 36.0–46.0)
Hemoglobin: 12.3 g/dL (ref 12.0–15.0)
Immature Granulocytes: 0 %
Lymphocytes Relative: 41 %
Lymphs Abs: 2.1 10*3/uL (ref 0.7–4.0)
MCH: 33.3 pg (ref 26.0–34.0)
MCHC: 31.9 g/dL (ref 30.0–36.0)
MCV: 104.3 fL — ABNORMAL HIGH (ref 80.0–100.0)
Monocytes Absolute: 0.4 10*3/uL (ref 0.1–1.0)
Monocytes Relative: 8 %
Neutro Abs: 2.3 10*3/uL (ref 1.7–7.7)
Neutrophils Relative %: 45 %
Platelets: 156 10*3/uL (ref 150–400)
RBC: 3.69 MIL/uL — ABNORMAL LOW (ref 3.87–5.11)
RDW: 12.1 % (ref 11.5–15.5)
WBC: 5.1 10*3/uL (ref 4.0–10.5)
nRBC: 0 % (ref 0.0–0.2)

## 2021-04-08 MED ORDER — OXYCODONE HCL 5 MG PO TABS
5.0000 mg | ORAL_TABLET | ORAL | 0 refills | Status: AC | PRN
  Filled 2021-04-08: qty 30, 5d supply, fill #0

## 2021-04-08 MED ORDER — POLYETHYLENE GLYCOL 3350 17 G PO PACK
17.0000 g | PACK | Freq: Once | ORAL | Status: AC
  Administered 2021-04-08: 17 g via ORAL
  Filled 2021-04-08: qty 1

## 2021-04-08 NOTE — Progress Notes (Signed)
[facility] Progress Note ? ?Karen [name] 51 y.o. 09-25-1970 ? ?CC:  Abdominal pain, elevated LFTs ? ? ?Subjective: ?Patient states she continued to have abdominal pain. Had a BM this morning that was a small ball followed with some mucus, no blood. Tolerating diet well. Denies nausea/vomiting ? ?ROS : ROS  ? ? ?Objective: ?Vital signs in last 24 hours: ?Vitals:  ? 04/08/21 0603 04/08/21 1344  ?BP: (!) 132/94 124/84  ?Pulse: 72 70  ?Resp: 17 15  ?Temp: 98.3 ?F (36.8 ?C) 97.8 ?F (36.6 ?C)  ?SpO2: 99% 100%  ? ? ?Physical Exam: ? ?General:  Alert, cooperative, no distress, appears stated age  ?Head:  Normocephalic, without obvious abnormality, atraumatic  ?Eyes:  Anicteric sclera, EOM's intact  ?Lungs:   Clear to auscultation bilaterally, respirations unlabored  ?Heart:  Regular rate and rhythm, S1, S2 normal  ?Abdomen:   Soft, generalized abdominal tenderness, bowel sounds active all four quadrants,  no masses,   ? ? ?Lab Results: ?Recent Labs  ?  04/07/21 ?0359 04/08/21 ?5631  ?NA 139 138  ?K 3.7 4.1  ?CL 107 108  ?CO2 26 24  ?GLUCOSE 95 89  ?BUN 6 6  ?CREATININE 0.70 0.68  ?CALCIUM 8.8* 8.6*  ? ?Recent Labs  ?  04/07/21 ?0359 04/08/21 ?4970  ?AST 60* 41  ?ALT 309* 234*  ?ALKPHOS 122 107  ?BILITOT 0.8 1.3*  ?PROT 6.4* 6.4*  ?ALBUMIN 3.3* 3.3*  ? ?Recent Labs  ?  04/07/21 ?0359 04/08/21 ?2637  ?WBC 5.1 5.1  ?NEUTROABS 2.4 2.3  ?HGB 11.1* 12.3  ?HCT 35.1* 38.5  ?MCV 103.5* 104.3*  ?PLT 156 156  ? ?No results for input(s): LABPROT, INR in the last 72 hours. ? ? ? ?Assessment ?Elevated LFTs ?-AST 41, ALT 234, alk phos 107, improving ?-T. bili 1.3, stable ?-Negative hepatic panel ?-Hemochromatosis DNA pending ?-Negative ASMA, AMA, ceruloplasmin, alpha 1 antitrypsin ?-Ferritin 1466 ? ?Abdominal pain ?Chronic, doubt colitis. ? ?Plan: ?Elevated LFTs of unclear etiology, LFTs improving.  Unlikely autoimmune hepatitis with normal ASMA/AMA.  Question iron overload, hemochromatosis gene panel pending. ?LFTs  improving, we will follow-up on hemochromatosis gene test as an outpatient.  Abdominal pain is chronic, doubt colitis.  Supportive care.  Tolerating diet well. ?From GI standpoint, patient can likely go home ?[facility] will sign off. Please contact us if we can be of any further assistance during this hospital stay.  ? ?Garnette Scheuermann PA-C ?04/08/2021, 2:20 PM ? ?Contact #  [phone]  ?

## 2021-04-08 NOTE — Discharge Summary (Addendum)
?Physician Discharge Summary  ?Karen [name] NGE:[id number] DOB: 07-02-1970 DOA: 04/04/2021 ? ?PCP: Leeroy Cha, MD ? ?Admit date: 04/04/2021 ?Discharge date: 04/08/2021 ? ?Admitted From: Home ?Disposition: Home ? ?Recommendations for Outpatient Follow-up:  ?Follow up with PCP in 1-2 weeks ?Follow-up with [facility] gastroenterology as scheduled, Dr. May guide ?Please obtain LFTs in 1 week ?Please follow up on the following pending results: Hemochromatosis DNA PCR ? ?Home Health: No ?Equipment/Devices: None ? ?Discharge Condition: Stable ?CODE STATUS: Full code ?Diet recommendation: Soft diet then can transition to regular diet as tolerates ? ?History of present illness: ? ?Karen [name] is a 51 year old female with past medical history significant for chronic abdominal pain, IBS, GERD with esophagitis, history of Schatzki's ring s/p dilation, SIBO s/p treatment 04/2019, endometriosis s/p hysterectomy, chronic anemia, history of DVT currently not on anticoagulation who presented to [facility] ED on 4/2 with severe abdominal pain associated with nausea and vomiting.  Patient was sent to the ED for further evaluation by her gastroenterology office. ?  ?Patient follows with [facility] gastroenterology and [facility] gastroenterology for chronic abdominal pain and does bowel cleanses for chronic constipation.  She has been on Amitiza for over a year for IBS. ?  ?In the ED, patient was noted to have elevated liver enzymes.  CT abdomen showing long segment mild wall thickening involving the ascending/transverse colon suggestive of mild colitis.  Gastroenterology was consulted.  Hospital service consulted for further evaluation and admission. ? ?Hospital course: ? ?Colitis ?Transaminitis ?Chronic abdominal pain/IBS ?Patient presenting to the ED by direction of her gastroenterologist for severe abdominal pain with associated intractable nausea/vomiting.  Patient was afebrile without leukocytosis.  CT abdomen with findings of mild colitis  in the transverse and ascending colon.  Patient with history of chronic abdominal pain, GERD, IBS.  [place] gastroenterology was consulted and followed during hospital course.  Patient's home Amitiza was held.  Patient's LFTs slowly improved during hospital course.  Patient's diet was slowly advanced with toleration.  Gastroenterology doubts inflammatory infectious colitis. Autoimmune work-up including alpha-1 antitrypsin, ASMA, mitochondrial antibodies negative/within normal limits, Ceruloplasmin 24.3, normal.  Ferritin was elevated 1004 and 66 which raises concerns for possible iron overload.  Hemochromatosis DNA PCR pending at time of discharge.  Per GI okay for discharge home with outpatient follow-up already scheduled with Dr. Watt Climes.  Recommend repeat LFTs 1 week.  Per GI, hold Amitiza on discharge and may resume if no bowel movement over a 3-day period.  Continue MiraLAX as needed. ?  ?GERD ?Esophagitis ?Continue home Pepcid ?  ?History of DVT ?Previously on Coumadin for 6 months, off of anticoagulation now. ? ? ? ?Discharge Diagnoses:  ?Principal Problem: ?  Elevated LFTs ?Active Problems: ?  Colitis ? ? ? ?Discharge Instructions ? ?Discharge Instructions   ? ? Call MD for:  difficulty breathing, headache or visual disturbances   Complete by: As directed ?  ? Call MD for:  extreme fatigue   Complete by: As directed ?  ? Call MD for:  persistant dizziness or light-headedness   Complete by: As directed ?  ? Call MD for:  persistant nausea and vomiting   Complete by: As directed ?  ? Call MD for:  severe uncontrolled pain   Complete by: As directed ?  ? Call MD for:  temperature >100.4   Complete by: As directed ?  ? Diet - low sodium heart healthy   Complete by: As directed ?  ? Increase activity slowly   Complete by: As directed ?  ? ?  ? ?  Allergies as of 04/08/2021   ? ?   Reactions  ? Molds & Smuts Cough  ? sneezing  ? ?  ? ?  ?Medication List  ?  ? ?TAKE these medications   ? ?albuterol 108 (90 Base) MCG/ACT  inhaler ?Commonly known as: VENTOLIN HFA ?Inhale 2 puffs into the lungs every 6 (six) hours as needed for wheezing or shortness of breath. ?  ?albuterol 108 (90 Base) MCG/ACT inhaler ?Commonly known as: VENTOLIN HFA ?INHALE 1 PUFF BY MOUTH EVERY 4-6 HRS ?  ?Elderberry 575 MG/5ML Syrp ?Take 30 mLs by mouth daily. ?  ?famotidine 20 MG tablet ?Commonly known as: PEPCID ?Take 1 tablet (20 mg total) by mouth at bedtime. ?  ?levocetirizine 5 MG tablet ?Commonly known as: XYZAL ?Take 1 tablet (5 mg total) by mouth every evening. ?What changed:  ?when to take this ?reasons to take this ?  ?lubiprostone 24 MCG capsule ?Commonly known as: AMITIZA ?Take 1 capsule (24 mcg total) by mouth 2 (two) times daily with a meal. ?What changed:  ?when to take this ?additional instructions ?  ?polyethylene glycol 17 g packet ?Commonly known as: MIRALAX / GLYCOLAX ?Take 17 g by mouth every other day. ?  ?Restora RX 60-1.25 MG Caps ?Generic drug: Lactobacillus Casei-Folic Acid ?Take 1 capsule by mouth daily between meals ?  ?Xiidra 5 % Soln ?Generic drug: Lifitegrast ?Place 1 drop into both eyes 2 (two) times daily. ?  ? ?  ? ? Follow-up Information   ? ? Leeroy Cha, MD. Schedule an appointment as soon as possible for a visit in 1 week(s).   ?Specialty: Internal Medicine ?Contact information: ?[address] ?[address] ?[place] [address] ?[phone] ? ? ?  ?  ? ? Clarene Essex, MD. Go to.   ?Specialty: Gastroenterology ?Contact information: ?[address] ?[place] [address] ?[phone] ? ? ?  ?  ? ?  ?  ? ?  ? ?Allergies  ?Allergen Reactions  ? Molds & Smuts Cough  ?  sneezing  ? ? ?Consultations: ?Gastroenterology, Dr. Watt Climes ? ? ?Procedures/Studies: ?CT ABDOMEN PELVIS W CONTRAST ? ?Result Date: 04/04/2021 ?CLINICAL DATA:  Left lower quadrant abdominal pain EXAM: CT ABDOMEN AND PELVIS WITH CONTRAST TECHNIQUE: Multidetector CT imaging of the abdomen and pelvis was performed using the standard protocol  following bolus administration of intravenous contrast. RADIATION DOSE REDUCTION: This exam was performed according to the departmental dose-optimization program which includes automated exposure control, adjustment of the mA and/or kV according to patient size and/or use of iterative reconstruction technique. CONTRAST:  161m OMNIPAQUE IOHEXOL 300 MG/ML  SOLN COMPARISON:  01/24/2021 FINDINGS: Lower chest: No acute abnormality. Hepatobiliary: No focal liver abnormality is seen. Status post cholecystectomy. No biliary dilatation. Pancreas: Unremarkable. No pancreatic ductal dilatation or surrounding inflammatory changes. Spleen: Normal in size without focal abnormality. Adrenals/Urinary Tract: Unremarkable adrenal glands. Kidneys enhance symmetrically without focal lesion, stone, or hydronephrosis. Ureters are nondilated. Urinary bladder appears unremarkable. Stomach/Bowel: Small hiatal hernia. Stomach within normal limits. No dilated loops of bowel. Long segment mild wall thickening involving the ascending and transverse colon. No pericolonic inflammatory changes. No appendiceal inflammation. Vascular/Lymphatic: No significant vascular findings are present. No enlarged abdominal or pelvic lymph nodes. Reproductive: Status post hysterectomy. No adnexal masses. Other: No free fluid. No abdominopelvic fluid collection. No pneumoperitoneum. No abdominal wall hernia. Musculoskeletal: No acute or significant osseous findings. IMPRESSION: 1. Long segment mild wall thickening involving the ascending and transverse colon, which may represent a  mild colitis. 2. Small hiatal hernia. Electronically Signed   By: Davina Poke D.O.   On: 04/04/2021 17:05  ? ?US Abdomen Limited RUQ (LIVER/GB) ? ?Result Date: 04/05/2021 ?CLINICAL DATA:  Abdominal pain.  Prior cholecystectomy. EXAM: ULTRASOUND ABDOMEN LIMITED RIGHT UPPER QUADRANT COMPARISON:  CT of yesterday FINDINGS: Gallbladder: Surgically absent Common bile duct: Diameter:  Normal, 2 mm. Liver: No focal lesion identified. Within normal limits in parenchymal echogenicity. Portal vein is patent on color Doppler imaging with normal direction of blood flow towards the liver. Other: None.

## 2021-04-08 NOTE — Discharge Instructions (Signed)
Hold home Amitiza on discharge unless no bowel movements over a 3 day period, then may resume. ?

## 2021-04-08 NOTE — Progress Notes (Signed)
?PROGRESS NOTE ? ? ? ?Karen [name]  FTD:[id number] DOB: Oct 15, 1970 DOA: 04/04/2021 ?PCP: Leeroy Cha, MD  ? ? ?Brief Narrative:  ?Karen [name] is a 51 year old female with past medical history significant for chronic abdominal pain, IBS, GERD with esophagitis, history of Schatzki's ring s/p dilation, SIBO s/p treatment 04/2019, endometriosis s/p hysterectomy, chronic anemia, history of DVT currently not on anticoagulation who presented to [facility] ED on 4/2 with severe abdominal pain associated with nausea and vomiting.  Patient was sent to the ED for further evaluation by her gastroenterology office. ? ?Patient follows with [facility] gastroenterology and [facility] gastroenterology for chronic abdominal pain and does bowel cleanses for chronic constipation.  She has been on Amitiza for over a year for IBS. ? ?In the ED, patient was noted to have elevated liver enzymes.  CT abdomen showing long segment mild wall thickening involving the ascending/transverse colon suggestive of mild colitis.  Gastroenterology was consulted.  Hospital service consulted for further evaluation and admission. ? ?Assessment & Plan: ? ?Colitis ?Transaminitis ?Patient presenting to the ED by direction of her gastroenterologist for severe abdominal pain with associated intractable nausea/vomiting.  Patient was afebrile without leukocytosis.  CT abdomen with findings of mild colitis in the transverse and ascending colon.  Patient with history of chronic abdominal pain, GERD, IBS. GI doubts inflammatory or infectious colitis. Autoimmune work-up including alpha-1 antitrypsin, ASMA, mitochondrial antibodies negative/within normal limits. Ceruloplasmin 24.3, normal. Ferritin elevated at 1466; ? Iron overload ?--[facility] following, appreciate assistance ?--AST 1044>>168>60>41 ?--ALT 754>>453>309>234 ?--Tbili 1.8>2.8>1.3>0.8>1.3 ?--Hemochromatosis DNA PCR: Pending ?--NS at 75 mL/h ?--Antiemetics ?--Await further recommendations per  gastroenterology ?--CMP daily ? ?GERD ?Esophagitis ?--Continue home Pepcid ? ?History of DVT ?Previously on Coumadin for 6 months, off of anticoagulation now. ? ? ? ?DVT prophylaxis: enoxaparin (LOVENOX) injection 40 mg Start: 04/04/21 2300 ? ?  Code Status: Full Code ?Family Communication: Updated daughter present at bedside this morning. ? ?Disposition Plan:  ?Level of care: Med-Surg ?Status is: Inpatient ?Remains inpatient appropriate because: IV fluid hydration, await further recommendations per gastroenterology. ?  ? ?Consultants:  ?[facility] ? ?Procedures:  ?None ? ?Antimicrobials:  ?None ? ? ?Subjective: ?Patient seen examined bedside, resting comfortably.  Lying in bed.  Daughter present.  Concerned about becoming constipated, with left lower quadrant ""fullness";  requesting dose of MiraLAX.  Currently tolerating diet, but not great intake and will try a little more "eggs" this morning.  LFTs continue to trend down.  No other specific questions or concerns at this time.  Await further GI recommendations.  Denies headache, no dizziness, no chest pain, no shortness of breath, no vomiting, diarrhea.  No acute events overnight per nursing staff. ? ?Objective: ?Vitals:  ? 04/07/21 0532 04/07/21 1357 04/07/21 2212 04/08/21 0603  ?BP: 119/76 131/86 121/81 (!) 132/94  ?Pulse: 67 67 62 72  ?Resp: '18 18 17 17  '$ ?Temp: 97.6 ?F (36.4 ?C) 98.3 ?F (36.8 ?C) (!) 97.4 ?F (36.3 ?C) 98.3 ?F (36.8 ?C)  ?TempSrc: Oral Oral Oral Oral  ?SpO2: 100% 99% 98% 99%  ?Weight:      ?Height:      ? ? ?Intake/Output Summary (Last 24 hours) at 04/08/2021 1053 ?Last data filed at 04/08/2021 1000 ?Gross per 24 hour  ?Intake 2263.24 ml  ?Output 5150 ml  ?Net -2886.76 ml  ? ?Filed Weights  ? 04/04/21 2105  ?Weight: 92.1 kg  ? ? ?Examination: ? ?Physical Exam: ?GEN: NAD, alert and oriented x 3, wd/wn ?HEENT: NCAT, PERRL, EOMI, sclera clear,  MMM ?PULM: CTAB w/o wheezes/crackles, normal respiratory effort, on room air ?CV: RRR w/o  M/G/R ?GI: abd soft, nondistended, diffuse tenderness to palpation throughout abdomen, faint bowel sounds, slight guarding, no rebound/masses ?MSK: no peripheral edema, muscle strength globally intact 5/5 bilateral upper/lower extremities ?NEURO: CN II-XII intact, no focal deficits, sensation to light touch intact ?PSYCH: normal mood/affect ?Integumentary: dry/intact, no rashes or wounds ? ? ? ?Data Reviewed: I have personally reviewed following labs and imaging studies ? ?CBC: ?Recent Labs  ?Lab 04/04/21 ?1522 04/05/21 ?0253 04/06/21 ?0407 04/07/21 ?4315 04/08/21 ?4008  ?WBC 12.0* 9.6 6.2 5.1 5.1  ?NEUTROABS  --   --  3.9 2.4 2.3  ?HGB 12.7 11.0* 10.4* 11.1* 12.3  ?HCT 38.5 34.1* 32.5* 35.1* 38.5  ?MCV 98.7 101.8* 102.5* 103.5* 104.3*  ?PLT 177 163 138* 156 156  ? ?Basic Metabolic Panel: ?Recent Labs  ?Lab 04/04/21 ?1522 04/05/21 ?0253 04/06/21 ?0407 04/07/21 ?6761 04/08/21 ?9509  ?NA 137 137 140 139 138  ?K 3.7 3.2* 4.0 3.7 4.1  ?CL 102 106 111 107 108  ?CO2 '25 27 26 26 24  '$ ?GLUCOSE 120* 94 100* 95 89  ?BUN '13 8 6 6 6  '$ ?CREATININE 0.83 0.63 0.60 0.70 0.68  ?CALCIUM 9.5 8.5* 8.6* 8.8* 8.6*  ? ?GFR: ?Estimated Creatinine Clearance: 95.8 mL/min (by C-G formula based on SCr of 0.68 mg/dL). ?Liver Function Tests: ?Recent Labs  ?Lab 04/04/21 ?1522 04/05/21 ?0253 04/06/21 ?0407 04/07/21 ?3267 04/08/21 ?1245  ?AST 1,044* 562* 168* 60* 41  ?ALT 754* 727* 453* 309* 234*  ?ALKPHOS 117 127* 120 122 107  ?BILITOT 1.8* 2.8* 1.3* 0.8 1.3*  ?PROT 7.5 6.1* 6.1* 6.4* 6.4*  ?ALBUMIN 4.1 3.2* 3.1* 3.3* 3.3*  ? ?Recent Labs  ?Lab 04/04/21 ?1522  ?LIPASE 31  ? ?No results for input(s): AMMONIA in the last 168 hours. ?Coagulation Profile: ?No results for input(s): INR, PROTIME in the last 168 hours. ?Cardiac Enzymes: ?No results for input(s): CKTOTAL, CKMB, CKMBINDEX, TROPONINI in the last 168 hours. ?BNP (last 3 results) ?No results for input(s): PROBNP in the last 8760 hours. ?HbA1C: ?No results for input(s): HGBA1C in the last 72  hours. ?CBG: ?No results for input(s): GLUCAP in the last 168 hours. ?Lipid Profile: ?No results for input(s): CHOL, HDL, LDLCALC, TRIG, CHOLHDL, LDLDIRECT in the last 72 hours. ?Thyroid Function Tests: ?No results for input(s): TSH, T4TOTAL, FREET4, T3FREE, THYROIDAB in the last 72 hours. ?Anemia Panel: ?Recent Labs  ?  04/05/21 ?1534 04/06/21 ?0407  ?[id number]  --  053*  ?FOLATE  --  21.4  ?FERRITIN 1,466*  --   ?TIBC 246*  --   ?IRON 123  --   ? ?Sepsis Labs: ?No results for input(s): PROCALCITON, LATICACIDVEN in the last 168 hours. ? ?No results found for this or any previous visit (from the past 240 hour(s)).  ? ? ? ? ? ?Radiology Studies: ?No results found. ? ? ? ? ? ?Scheduled Meds: ? enoxaparin (LOVENOX) injection  40 mg Subcutaneous Q24H  ? famotidine  20 mg Oral QHS  ? polyethylene glycol  17 g Oral Once  ? ?Continuous Infusions: ? sodium chloride 75 mL/hr at 04/08/21 0307  ? ? ? LOS: 3 days  ? ? ?Time spent: 52 minutes spent on chart review, discussion with nursing staff, consultants, updating family and interview/physical exam; more than 50% of that time was spent in counseling and/or coordination of care. ? ? ? ?Newt Levingston J [place] DO ?[facility] ?Available via Epic secure chat 7am-7pm ?After  these hours, please refer to coverage provider listed on [email or web address] ?04/08/2021, 10:53 AM  ? ?

## 2021-04-14 LAB — HEMOCHROMATOSIS DNA-PCR(C282Y,H63D)

## 2021-04-16 ENCOUNTER — Telehealth: Payer: Self-pay | Admitting: Gastroenterology

## 2021-04-16 NOTE — Telephone Encounter (Signed)
Good morning Dr. Tarri Glenn, ? ?(DoD 04/16/21, a.m.) ? ?This patient was recently in the hospital for her liver enzyme and ferritin being "off the charts" along with pain.  Her records are in Epic for your review.  She has been a long-time patient of Dr. Cristina Gong at [facility] and also another doctor at [facility].  Dr. Cristina Gong has retired.  She said [facility] followed her for basic gastroenterology and [facility] followed her if anything more complicated arose.  She was supposed to have a follow-up with Dr. Watt Climes to have labwork done within a week of her discharge from the hospital prior to her upcoming appointment at [facility] on [address].  Dr. Watt Climes cancelled her appointment and is is refusing to see her stating that she needs a higher level of care than he can provide.  All she needs is a follow up appointment with a provider to go over the results, order the labwork that is recommended prior to her appointment at [facility], and discuss what her next steps should be.  She doesn't understand why [facility] denied her and wants to know if someone here could possibly provide her with the assistance she needs. ? ?Please advise how I should proceed. ? ?Thank you. ?

## 2021-04-16 NOTE — Telephone Encounter (Signed)
Dr. Harriet Masson called on the patient's behalf trying to help get her in for an appointment. I reviewed our current wait times for appointments, my review of the patient's record, and her current care via [facility] and [facility]. Follow-up with [facility] as scheduled recommended.  ?

## 2021-04-20 DIAGNOSIS — R748 Abnormal levels of other serum enzymes: Secondary | ICD-10-CM | POA: Diagnosis not present

## 2021-04-23 DIAGNOSIS — R748 Abnormal levels of other serum enzymes: Secondary | ICD-10-CM | POA: Diagnosis not present

## 2021-04-23 DIAGNOSIS — R109 Unspecified abdominal pain: Secondary | ICD-10-CM | POA: Diagnosis not present

## 2021-04-23 DIAGNOSIS — G8929 Other chronic pain: Secondary | ICD-10-CM | POA: Diagnosis not present

## 2021-04-27 DIAGNOSIS — R7989 Other specified abnormal findings of blood chemistry: Secondary | ICD-10-CM | POA: Diagnosis not present

## 2021-04-27 DIAGNOSIS — D72829 Elevated white blood cell count, unspecified: Secondary | ICD-10-CM | POA: Diagnosis not present

## 2021-04-27 DIAGNOSIS — R748 Abnormal levels of other serum enzymes: Secondary | ICD-10-CM | POA: Diagnosis not present

## 2021-04-29 DIAGNOSIS — K6389 Other specified diseases of intestine: Secondary | ICD-10-CM | POA: Diagnosis not present

## 2021-04-29 DIAGNOSIS — R1310 Dysphagia, unspecified: Secondary | ICD-10-CM | POA: Diagnosis not present

## 2021-04-29 DIAGNOSIS — R7989 Other specified abnormal findings of blood chemistry: Secondary | ICD-10-CM | POA: Diagnosis not present

## 2021-04-29 DIAGNOSIS — R748 Abnormal levels of other serum enzymes: Secondary | ICD-10-CM | POA: Diagnosis not present

## 2021-04-29 DIAGNOSIS — K219 Gastro-esophageal reflux disease without esophagitis: Secondary | ICD-10-CM | POA: Diagnosis not present

## 2021-04-29 DIAGNOSIS — R109 Unspecified abdominal pain: Secondary | ICD-10-CM | POA: Diagnosis not present

## 2021-04-29 DIAGNOSIS — K222 Esophageal obstruction: Secondary | ICD-10-CM | POA: Diagnosis not present

## 2021-04-29 DIAGNOSIS — K589 Irritable bowel syndrome without diarrhea: Secondary | ICD-10-CM | POA: Diagnosis not present

## 2021-05-04 ENCOUNTER — Other Ambulatory Visit (HOSPITAL_COMMUNITY): Payer: Self-pay

## 2021-05-11 ENCOUNTER — Other Ambulatory Visit (HOSPITAL_COMMUNITY): Payer: Self-pay

## 2021-05-11 DIAGNOSIS — R070 Pain in throat: Secondary | ICD-10-CM | POA: Diagnosis not present

## 2021-05-11 DIAGNOSIS — K219 Gastro-esophageal reflux disease without esophagitis: Secondary | ICD-10-CM | POA: Diagnosis not present

## 2021-05-11 DIAGNOSIS — R1312 Dysphagia, oropharyngeal phase: Secondary | ICD-10-CM | POA: Diagnosis not present

## 2021-05-11 MED ORDER — HYDROCORTISONE 10 MG PO TABS
5.0000 mL | ORAL_TABLET | Freq: Four times a day (QID) | ORAL | 5 refills | Status: DC
  Filled 2021-05-11: qty 250, 13d supply, fill #0

## 2021-05-11 MED ORDER — NYSTATIN 100000 UNIT/ML MT SUSP
5.0000 mL | Freq: Four times a day (QID) | OROMUCOSAL | 5 refills | Status: DC
  Filled 2021-05-11: qty 250, 13d supply, fill #0

## 2021-05-11 MED ORDER — FAMOTIDINE 20 MG PO TABS
20.0000 mg | ORAL_TABLET | Freq: Two times a day (BID) | ORAL | 5 refills | Status: DC
  Filled 2021-07-08: qty 180, 90d supply, fill #0
  Filled 2021-10-07: qty 180, 90d supply, fill #1

## 2021-05-12 ENCOUNTER — Other Ambulatory Visit (HOSPITAL_COMMUNITY): Payer: Self-pay

## 2021-05-28 ENCOUNTER — Other Ambulatory Visit (HOSPITAL_COMMUNITY): Payer: Self-pay

## 2021-06-03 ENCOUNTER — Ambulatory Visit
Admission: RE | Admit: 2021-06-03 | Discharge: 2021-06-03 | Disposition: A | Discharge status: Home / Self Care | Payer: 59 | Source: Ambulatory Visit | Attending: Obstetrics & Gynecology | Admitting: Obstetrics & Gynecology

## 2021-06-03 ENCOUNTER — Other Ambulatory Visit: Payer: Self-pay | Admitting: Obstetrics & Gynecology

## 2021-06-03 ENCOUNTER — Inpatient Hospital Stay: Payer: 59 | Admitting: Hematology & Oncology

## 2021-06-03 ENCOUNTER — Inpatient Hospital Stay: Payer: 59 | Attending: Hematology & Oncology

## 2021-06-03 ENCOUNTER — Encounter: Payer: Self-pay | Admitting: Hematology & Oncology

## 2021-06-03 VITALS — BP 126/81 | HR 76 | Temp 97.6°F | Resp 17 | Wt 203.8 lb

## 2021-06-03 DIAGNOSIS — Z1231 Encounter for screening mammogram for malignant neoplasm of breast: Secondary | ICD-10-CM

## 2021-06-03 DIAGNOSIS — R790 Abnormal level of blood mineral: Secondary | ICD-10-CM

## 2021-06-03 DIAGNOSIS — R7989 Other specified abnormal findings of blood chemistry: Secondary | ICD-10-CM | POA: Insufficient documentation

## 2021-06-03 LAB — CBC WITH DIFFERENTIAL (CANCER CENTER ONLY)
Abs Immature Granulocytes: 0.01 10*3/uL (ref 0.00–0.07)
Basophils Absolute: 0 10*3/uL (ref 0.0–0.1)
Basophils Relative: 1 %
Eosinophils Absolute: 0.2 10*3/uL (ref 0.0–0.5)
Eosinophils Relative: 3 %
HCT: 40.9 % (ref 36.0–46.0)
Hemoglobin: 13.2 g/dL (ref 12.0–15.0)
Immature Granulocytes: 0 %
Lymphocytes Relative: 32 %
Lymphs Abs: 1.8 10*3/uL (ref 0.7–4.0)
MCH: 32.6 pg (ref 26.0–34.0)
MCHC: 32.3 g/dL (ref 30.0–36.0)
MCV: 101 fL — ABNORMAL HIGH (ref 80.0–100.0)
Monocytes Absolute: 0.5 10*3/uL (ref 0.1–1.0)
Monocytes Relative: 8 %
Neutro Abs: 3.2 10*3/uL (ref 1.7–7.7)
Neutrophils Relative %: 56 %
Platelet Count: 204 10*3/uL (ref 150–400)
RBC: 4.05 MIL/uL (ref 3.87–5.11)
RDW: 11.9 % (ref 11.5–15.5)
WBC Count: 5.7 10*3/uL (ref 4.0–10.5)
nRBC: 0 % (ref 0.0–0.2)

## 2021-06-03 LAB — CMP (CANCER CENTER ONLY)
ALT: 14 U/L (ref 0–44)
AST: 13 U/L — ABNORMAL LOW (ref 15–41)
Albumin: 4.4 g/dL (ref 3.5–5.0)
Alkaline Phosphatase: 89 U/L (ref 38–126)
Anion gap: 6 (ref 5–15)
BUN: 17 mg/dL (ref 6–20)
CO2: 30 mmol/L (ref 22–32)
Calcium: 10 mg/dL (ref 8.9–10.3)
Chloride: 102 mmol/L (ref 98–111)
Creatinine: 0.87 mg/dL (ref 0.44–1.00)
GFR, Estimated: >60 mL/min (ref >60)
Glucose, Bld: 95 mg/dL (ref 70–99)
Potassium: 4.2 mmol/L (ref 3.5–5.1)
Sodium: 138 mmol/L (ref 135–145)
Total Bilirubin: 0.5 mg/dL (ref 0.3–1.2)
Total Protein: 7.8 g/dL (ref 6.5–8.1)

## 2021-06-03 LAB — FERRITIN: Ferritin: 390 ng/mL — ABNORMAL HIGH (ref 11–307)

## 2021-06-03 NOTE — Progress Notes (Addendum)
Referral MD  Reason for Referral: Elevated ferritin --possibly acute phase reactant  Chief Complaint  Patient presents with   New Patient (Initial Visit)  : My iron level is high.  HPI: Karen [name] is known to me.  She is actually the daughter of one of our patients.  She actually is a mom of another one of our patients.  She has been relatively healthy herself until recently.  She was hospitalized with of nominal pain.  She had a fairly thorough work-up.  She was found to have a high ferritin when she was in the hospital.  Her ferritin was 1000.66.  Her iron saturation was 50%.  She had a CT scan that was done.  This done on 04/04/2021.  This showed that she may have had some colitis.  There is some thickening in the colon.  This appeared to be in the ascending and transverse colon.  There was no issues with the liver.  As far she knows, there is no blood in the family has had any kind of liver issues.  There is no issue of cirrhosis in the family.  She does not smoke.  She does not drink.  She was currently referred to the [facility] because of the elevated ferritin.  There is some concern about the possibility of hemochromatosis.  She does not have thyroid issues.  She does not have diabetes.  She has had no fever.  There is no weight loss or weight gain.  She has had no problems with any type of foreign travel.  She has had past surgeries.  She has had a hysterectomy because of bleeding.  She has had endometrial ablation.  Overall, I would have to say that her performance status is probably ECOG 0.   Past Medical History:  Diagnosis Date   Adenomyosis    Anemia    Arthritis 2013   L knee gets steroid injections    Asthmatic bronchitis 01/31/2017   Back pain    Chronic constipation    Diarrhea    DVT of lower extremity (deep venous thrombosis) (HCC)    Dysmenorrhea    Endometriosis    Esophageal stricture    GERD (gastroesophageal reflux disease)     Hiatal hernia    History of uterine fibroid    IBS (irritable bowel syndrome)    Interstitial cystitis    Kidney stones    Lactose intolerance    Migraine    with aura   Recurrent upper respiratory infection (URI)    Sebaceous cyst of breast, right lower inner quadrant 10/25/2012   Excised 11/21/12    Sleep apnea    Syncope, non cardiac   :   Past Surgical History:  Procedure Laterality Date   ARTHROSCOPIC HAGLUNDS REPAIR     BREAST CYST EXCISION Right 11/21/2012   Procedure: CYST EXCISION BREAST;  Surgeon: Haywood Lasso, MD;  Location: [facility];  Service: General;  Laterality: Right;   BREAST EXCISIONAL BIOPSY Right 11/2012   CESAREAN SECTION  04/19/1993   CHOLECYSTECTOMY     COLONOSCOPY  05/02/2011   Procedure: COLONOSCOPY;  Surgeon: Winfield Cunas., MD;  Location: [facility];  Service: Endoscopy;  Laterality: N/A;   colonoscopy  11/04/2014   DENTAL SURGERY  03/06/2018   2 surgeries ( 03/06/2018 and 04/06/2018)   to remove two separate benign tumors   ESOPHAGEAL MANOMETRY N/A 11/04/2015   Procedure: ESOPHAGEAL MANOMETRY (EM);  Surgeon: Laurence Spates, MD;  Location:  [facility];  Service: Endoscopy;  Laterality: N/A;   ESOPHAGOGASTRODUODENOSCOPY  05/02/2011   Procedure: ESOPHAGOGASTRODUODENOSCOPY (EGD);  Surgeon: Winfield Cunas., MD;  Location: [facility];  Service: Endoscopy;  Laterality: N/A;   ESOPHAGOGASTRODUODENOSCOPY N/A 09/01/2014   Procedure: ESOPHAGOGASTRODUODENOSCOPY (EGD);  Surgeon: Laurence Spates, MD;  Location: [facility];  Service: Endoscopy;  Laterality: N/A;   KNEE SURGERY     LAPAROSCOPY     laproscopic abdominal     PH IMPEDANCE STUDY N/A 11/04/2015   Procedure: [place] IMPEDANCE STUDY;  Surgeon: Laurence Spates, MD;  Location: [facility];  Service: Endoscopy;  Laterality: N/A;   VAGINAL HYSTERECTOMY  2010   TVH--ovaries remain  :   Current Outpatient Medications:    albuterol (VENTOLIN HFA) 108 (90 Base) MCG/ACT inhaler, INHALE 1  PUFF BY MOUTH EVERY 4-6 HRS, Disp: 18 g, Rfl: 2   albuterol (VENTOLIN HFA) 108 (90 Base) MCG/ACT inhaler, Inhale 2 puffs into the lungs every 6 (six) hours as needed for wheezing or shortness of breath., Disp: , Rfl:    Elderberry 575 MG/5ML SYRP, Take 30 mLs by mouth daily., Disp: , Rfl:    famotidine (PEPCID) 20 MG tablet, Take 1 tablet (20 mg total) by mouth 2 (two) times daily., Disp: 120 tablet, Rfl: 5   Lactobacillus Casei-Folic Acid (RESTORA RX) 60-1.25 MG CAPS, Take 1 capsule by mouth daily between meals, Disp: 30 capsule, Rfl: 5   levocetirizine (XYZAL) 5 MG tablet, Take 1 tablet (5 mg total) by mouth every evening. (Patient taking differently: Take 5 mg by mouth daily as needed for allergies.), Disp: 30 tablet, Rfl: 5   Lifitegrast (XIIDRA) 5 % SOLN, Place 1 drop into both eyes 2 (two) times daily., Disp: 60 each, Rfl: PRN   polyethylene glycol (MIRALAX / GLYCOLAX) 17 g packet, Take 17 g by mouth every other day., Disp: , Rfl:    famotidine (PEPCID) 20 MG tablet, Take 1 tablet (20 mg total) by mouth at bedtime. (Patient not taking: Reported on 06/03/2021), Disp: 90 tablet, Rfl: 5   hydrocortisone-nystatin-diphenhydrAMINE, Rinse with 5 mLs for 1 to 2 minutes by mouth 4 (four) times daily then swallow, use for 1 week (Patient not taking: Reported on 06/03/2021), Disp: 250 mL, Rfl: 5   lubiprostone (AMITIZA) 24 MCG capsule, Take 1 capsule (24 mcg total) by mouth 2 (two) times daily with a meal. (Patient not taking: Reported on 06/03/2021), Disp: 60 capsule, Rfl: 5:  :   Allergies  Allergen Reactions   Molds & Smuts Cough    sneezing  :   Family History  Problem Relation Age of Onset   Colon cancer Father 36   Hypertension Father    Stroke Father    Heart disease Father    Heart attack Father    Hyperlipidemia Father    Colon cancer Mother 74   Diabetes Maternal Uncle    Stroke Paternal Grandmother    Heart attack Paternal Grandmother    Heart attack Maternal Grandmother    Heart  attack Maternal Grandfather    Cancer Maternal Uncle    Hypertension Sister    Diabetes Brother    Breast cancer Neg Hx   :   Social History   Socioeconomic History   Marital status: Married    Spouse name: Jeneen Rinks   Number of children: 2   Years of education: Not on file   Highest education level: Not on file  Occupational History   Occupation: Armed forces operational officer  Tobacco Use  Smoking status: Never   Smokeless tobacco: Never  Vaping Use   Vaping Use: Never used  Substance and Sexual Activity   Alcohol use: Not Currently   Drug use: No   Sexual activity: Yes    Partners: Male    Birth control/protection: None, Surgical    Comment: TVH-ovaries remain  Other Topics Concern   Not on file  Social History Narrative   Not on file   Social Determinants of Health   Financial Resource Strain: Not on file  Food Insecurity: Not on file  Transportation Needs: Not on file  Physical Activity: Not on file  Stress: Not on file  Social Connections: Not on file  Intimate Partner Violence: Not on file  :  Review of Systems  Constitutional: Negative.   HENT: Negative.    Eyes: Negative.   Respiratory: Negative.    Cardiovascular: Negative.   Gastrointestinal:  Positive for abdominal pain, heartburn and nausea.  Genitourinary: Negative.   Musculoskeletal: Negative.   Skin: Negative.   Neurological: Negative.   Endo/Heme/Allergies: Negative.   Psychiatric/Behavioral: Negative.      Exam: '@IPVITALS'$ @ Physical Exam Vitals reviewed.  HENT:     Head: Normocephalic and atraumatic.  Eyes:     Pupils: Pupils are equal, round, and reactive to light.  Cardiovascular:     Rate and Rhythm: Normal rate and regular rhythm.     Heart sounds: Normal heart sounds.  Pulmonary:     Effort: Pulmonary effort is normal.     Breath sounds: Normal breath sounds.  Abdominal:     General: Bowel sounds are normal.     Palpations: Abdomen is soft.  Musculoskeletal:        General: No  tenderness or deformity. Normal range of motion.     Cervical back: Normal range of motion.  Lymphadenopathy:     Cervical: No cervical adenopathy.  Skin:    General: Skin is warm and dry.     Findings: No erythema or rash.  Neurological:     Mental Status: She is alert and oriented to person, place, and time.  Psychiatric:        Behavior: Behavior normal.        Thought Content: Thought content normal.        Judgment: Judgment normal.    Recent Labs    06/03/21 1351  WBC 5.7  HGB 13.2  HCT 40.9  PLT 204    Recent Labs    06/03/21 1351  NA 138  K 4.2  CL 102  CO2 30  GLUCOSE 95  BUN 17  CREATININE 0.87  CALCIUM 10.0    Blood smear review: Unremarkable  Pathology: None    Assessment and Plan: Karen [name] is a very nice 51 year old Afro-American female.  She had an elevated ferritin level in the hospital.  Again, this was in the setting of an acute abdominal issue.  It would be tough to say that she has hemochromatosis.  I cannot ever remember seeing hemochromatosis in an African-American patient.  The fact that her iron saturation was only 50% would make hemochromatosis a little unlikely unless she was heterozygous for a mutation.  We will have to see what the genetic analysis shows.  This will tell us if there is any issue with hemochromatosis.  He will be also interesting to see what her iron studies are now.  Is been 2 months since she had this abdominal issue.  Again if this elevated ferritin was an acute phase  reactant, the level should be down as well as the iron saturation.  I would like to think that we are not going to have to deal with hemochromatosis.  However, if she does have it, we could certainly get her on a phlebotomy program if necessary.  We will have to see if we do need to see her back again.  The labs and genetic assay will tell us this.  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  I think we probably just need to follow along every 6 months.  I called her with the results of the genetic assay.  Lattie Haw, MD

## 2021-06-04 ENCOUNTER — Encounter: Payer: Self-pay | Admitting: *Deleted

## 2021-06-04 LAB — IRON AND IRON BINDING CAPACITY (CC-WL,HP ONLY)
Iron: 73 ug/dL (ref 28–170)
Saturation Ratios: 24 % (ref 10.4–31.8)
TIBC: 300 ug/dL (ref 250–450)
UIBC: 227 ug/dL (ref 148–442)

## 2021-06-04 LAB — AFP TUMOR MARKER: AFP, Serum, Tumor Marker: 3.8 ng/mL (ref 0.0–9.2)

## 2021-06-08 ENCOUNTER — Ambulatory Visit: Payer: 59 | Admitting: Allergy & Immunology

## 2021-06-08 ENCOUNTER — Encounter: Payer: Self-pay | Admitting: Allergy & Immunology

## 2021-06-08 VITALS — BP 122/78 | HR 75 | Temp 98.7°F | Resp 16 | Ht 67.0 in | Wt 205.0 lb

## 2021-06-08 DIAGNOSIS — T7840XD Allergy, unspecified, subsequent encounter: Secondary | ICD-10-CM

## 2021-06-08 DIAGNOSIS — J3089 Other allergic rhinitis: Secondary | ICD-10-CM

## 2021-06-08 DIAGNOSIS — R1310 Dysphagia, unspecified: Secondary | ICD-10-CM | POA: Diagnosis not present

## 2021-06-08 DIAGNOSIS — R197 Diarrhea, unspecified: Secondary | ICD-10-CM

## 2021-06-08 NOTE — Progress Notes (Signed)
FOLLOW UP  Date of Service/Encounter:  06/08/21   Assessment:   SOB (shortness of breath) - largely resolved   Diarrhea - with a history of SIBO s/p treatment by Dr. Jacquiline Doe and constipation s/p two cleanouts   GERD - has been on PPIs and H2 blockers in the past  Scleroderma?   Karen [name] presents for a follow-up visit.  I am not entirely clear that this is related at all to allergy or immunology, but it certainly is a unique case.  I am going to get some autoimmune screening labs to see what might be contributing to her current set of symptoms.  I have agreed to do anything else with regards to her shortness of breath since this seems to have resolved.  We are going to get a serum tryptase as well to look for mast cell disease.   Plan/Recommendations:   1. SOB (shortness of breath) - Lung testing not done. - I do not think that this is asthma at all.   2. Dysphagia - I look forward to seeing what the endoscopy shows earlier this week.    3. Concern with allergic reactions - I am going to get a serum tryptase to look for mast cell disease. - I am also going to do an autoimmune workup to look for weird causes of your symptoms. - We will call you in 1-2 weeks with the results of the testing.   4. Return in about 6 months (around 12/08/2021).    Subjective:   Karen [name] is a 51 y.o. female presenting today for follow up of  Chief Complaint  Patient presents with   Follow-up    Karen [name] has a history of the following: Patient Active Problem List   Diagnosis Date Noted   Elevated LFTs 04/04/2021   Colitis 04/04/2021   Bacterial overgrowth syndrome 05/21/2020   Obesity 05/21/2020   History of endometriosis 05/21/2020   Constipation due to outlet dysfunction 02/05/2020   Gastroesophageal reflux disease 02/05/2020   Obstructive sleep apnea treated with continuous positive airway pressure (CPAP) 06/16/2017   Perennial allergic rhinitis with a  nonallergic component 01/31/2017   Asthmatic bronchitis 01/31/2017   GI symptoms 01/31/2017   Family history of colon cancer 01/27/2015   Chest pain 12/13/2012   Dyspnea 12/13/2012   DVT of lower extremity (deep venous thrombosis) ([place]) 01/03/1990    History obtained from: chart review and patient.  Karen [name] is a 51 y.o. female presenting for a follow up visit.  She was last seen in December 2022.  At that time, her lung testing looked a little worse but overall pretty good.  For her dysphagia, we talked about doing an endoscopy.  We referred her to ENT for evaluation of her airway.  She was also worried about a neck cyst as well.  Since last visit, she has continued to have a whole host of issues.  She did go to see Dr. Benjamine Mola after I saw her. She continued to get thrush. Her throat appeared irritated like she was having silent reflux. She was started on Pepcid daily and her symptoms worsened. She was increased to twice daily.   She ended up going into the ED in April with severe nausea and vomiting. She had a workup that showed liver enzymes that were markedly elevated. She was admitted and spent five days at [facility]. 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 She is being worked up for that by Dr. Marin Olp. Her ferritin  levels have decreased.  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  She has not had an upper endoscopy yet. Her last one was in 2020 and demonstrated a tortuous esophagus as well as white nummular lesions.  There was a nonobstructing Schatzki ring which was dilated.  She also had a 2 cm hiatal hernia as well as grade a reflux esophagitis.  She had an esophageal manometry in November 2020 which was normal.  She is currently followed by Dr. Sebastian Ache at [facility] under the direction of Dr. Jonetta Osgood.  At the last visit in April, she was continued on MiraLAX daily.  Her famotidine was increased  to twice daily.  Her last colonoscopy was in 2021 and showed no polyps or inflammation.  She carries a diagnosis of IBS-C with overflow diarrhea.  It does not seem that he fully explained her elevated liver enzymes.  Asthma/Respiratory Symptom History: She continues to have coughing episodes and has been choking on things. She has noticed that this is worse when she is constipated. This is thought to be related to increased intrathoracic pressure. Dr. Benjamine Mola is still looking into this.    She is worried that she is having allergic reactions to things. This happens 2-3 times per week.  This sensation starts with a tickle in her throat. Then she starts coughing and she feels that something is pushing up. Then she can drink something and it can hit the spot and start spasming. She has been on elderberry which keeps her bronchitis at [place].   She has not had antibiotics in 4 years or so.  She has been using elderberry which seems to control her upper respiratory issues.  She originally started seeing [place] in January 2019 for nasal congestion as well as wheezing and food intolerance.  She was started on Flovent 220 mcg 2 puffs twice a day at that time, but she takes no inhalers anymore at all.  She attributes this to the elderberry that she is consuming.  She does endorse some dry mouth and dry eyes.  She denies any fevers, rashes, or joint pains.  There is no autoimmunity in the family at all.  Otherwise, there have been no changes to her past medical history, surgical history, family history, or social history.    Review of Systems  Constitutional: Negative.  Negative for chills, fever, malaise/fatigue and weight loss.  HENT:  Positive for congestion. Negative for ear discharge and ear pain.        Positive for postnasal drip.   Eyes:  Negative for pain, discharge and redness.  Respiratory:  Negative for cough, sputum production, shortness of breath and wheezing.   Cardiovascular: Negative.  Negative for  chest pain and palpitations.  Gastrointestinal:  Positive for abdominal pain, constipation and diarrhea. Negative for heartburn, nausea and vomiting.  Skin: Negative.  Negative for itching and rash.  Neurological:  Negative for dizziness and headaches.  Endo/Heme/Allergies:  Negative for environmental allergies. Does not bruise/bleed easily.  All other systems reviewed and are negative.      Objective:   Blood pressure 122/78, pulse 75, temperature 98.7 F (37.1 C), resp. rate 16, height '5\' 7"'$  (1.702 m), weight 205 lb (93 kg), SpO2 98 %. Body mass index is 32.11 kg/m.    Physical Exam Vitals reviewed.  Constitutional:      Appearance: She is well-developed.  HENT:     Head: Normocephalic and atraumatic.  Right Ear: Tympanic membrane, ear canal and external ear normal. No drainage, swelling or tenderness. Tympanic membrane is not injected, scarred, erythematous, retracted or bulging.     Left Ear: Tympanic membrane, ear canal and external ear normal. No drainage, swelling or tenderness. Tympanic membrane is not injected, scarred, erythematous, retracted or bulging.     Nose: No nasal deformity, septal deviation, mucosal edema or rhinorrhea.     Right Turbinates: Enlarged, swollen and pale.     Left Turbinates: Enlarged, swollen and pale.     Right Sinus: No maxillary sinus tenderness or frontal sinus tenderness.     Left Sinus: No maxillary sinus tenderness or frontal sinus tenderness.     Mouth/Throat:     Lips: Pink.     Mouth: Mucous membranes are moist. Mucous membranes are not pale and not dry.     Pharynx: Uvula midline.  Eyes:     General:        Right eye: No discharge.        Left eye: No discharge.     Conjunctiva/sclera: Conjunctivae normal.     Right eye: Right conjunctiva is not injected. No chemosis.    Left eye: Left conjunctiva is not injected. No chemosis.    Pupils: Pupils are equal, round, and reactive to light.  Cardiovascular:     Rate and Rhythm:  Normal rate and regular rhythm.     Heart sounds: Normal heart sounds.  Pulmonary:     Effort: Pulmonary effort is normal. No tachypnea, accessory muscle usage or respiratory distress.     Breath sounds: Normal breath sounds. No wheezing, rhonchi or rales.     Comments: Moving air well in all lung fields. No increased work of breathing noted.  Chest:     Chest wall: No tenderness.  Abdominal:     Tenderness: There is no abdominal tenderness. There is no guarding or rebound.  Lymphadenopathy:     Head:     Right side of head: No submandibular, tonsillar or occipital adenopathy.     Left side of head: No submandibular, tonsillar or occipital adenopathy.     Cervical: No cervical adenopathy.  Skin:    General: Skin is warm.     Capillary Refill: Capillary refill takes less than 2 seconds.     Coloration: Skin is not pale.     Findings: No abrasion, erythema, petechiae or rash. Rash is not papular, urticarial or vesicular.     Comments: No eczematous or urticarial lesions noted.   Neurological:     Mental Status: She is alert.  Psychiatric:        Behavior: Behavior is cooperative.      Diagnostic studies: none       Salvatore Marvel, MD  [facility]

## 2021-06-08 NOTE — Patient Instructions (Addendum)
1. SOB (shortness of breath) - Lung testing not done. - I do not think that this is asthma at all.   2. Dysphagia - I look forward to seeing what the endoscopy shows earlier this week.    3. Concern with allergic reactions - I am going to get a serum tryptase to look for mast cell disease. - I am also going to do an autoimmune workup to look for weird causes of your symptoms. - We will call you in 1-2 weeks with the results of the testing.   4. Return in about 6 months (around 12/08/2021).    Please inform us of any Emergency Department visits, hospitalizations, or changes in symptoms. Call us before going to the ED for breathing or allergy symptoms since we might be able to fit you in for a sick visit. Feel free to contact us anytime with any questions, problems, or concerns.  It was a pleasure to see you today!  Websites that have reliable patient information: 1. American Academy of Asthma, Allergy, and Immunology: www.aaaai.org 2. Food Allergy Research and Education (FARE): foodallergy.org 3. Mothers of Asthmatics: http://www.asthmacommunitynetwork.org 4. American College of Allergy, Asthma, and Immunology: www.acaai.org   COVID-19 Vaccine Information can be found at: ShippingScam.co.uk For questions related to vaccine distribution or appointments, please email [email or web address] or call [phone].   We realize that you might be concerned about having an allergic reaction to the COVID19 vaccines. To help with that concern, WE ARE OFFERING THE COVID19 VACCINES IN OUR OFFICE! Ask the front desk for dates!     "Like" [place] on Facebook and Instagram for our latest updates!      A healthy democracy works best when [employer] participate! Make sure you are registered to vote! If you have moved or changed any of your contact information, you will need to get this updated before voting!  In some cases, you MAY be able to  register to vote online: [email or web address]

## 2021-06-09 LAB — HEMOCHROMATOSIS DNA-PCR(C282Y,H63D)

## 2021-06-10 ENCOUNTER — Encounter: Payer: Self-pay | Admitting: Hematology & Oncology

## 2021-06-10 ENCOUNTER — Encounter: Payer: Self-pay | Admitting: Allergy & Immunology

## 2021-06-10 HISTORY — DX: Other hemochromatosis: E83.118

## 2021-06-10 NOTE — Addendum Note (Signed)
Addended by: Burney Gauze R on: 06/10/2021 04:47 PM   Modules accepted: Orders

## 2021-06-11 LAB — C-REACTIVE PROTEIN: CRP: 11 mg/L — ABNORMAL HIGH (ref 0–10)

## 2021-06-11 LAB — ANTINUCLEAR ANTIBODIES, IFA: ANA Titer 1: NEGATIVE

## 2021-06-11 LAB — TRYPTASE: Tryptase: 3 ug/L (ref 2.2–13.2)

## 2021-06-11 LAB — SEDIMENTATION RATE: Sed Rate: 27 mm/hr (ref 0–40)

## 2021-07-08 ENCOUNTER — Other Ambulatory Visit (HOSPITAL_COMMUNITY): Payer: Self-pay

## 2021-07-12 DIAGNOSIS — K219 Gastro-esophageal reflux disease without esophagitis: Secondary | ICD-10-CM | POA: Diagnosis not present

## 2021-07-12 DIAGNOSIS — R070 Pain in throat: Secondary | ICD-10-CM | POA: Diagnosis not present

## 2021-07-22 DIAGNOSIS — K582 Mixed irritable bowel syndrome: Secondary | ICD-10-CM | POA: Diagnosis not present

## 2021-07-22 DIAGNOSIS — R7989 Other specified abnormal findings of blood chemistry: Secondary | ICD-10-CM | POA: Diagnosis not present

## 2021-07-22 DIAGNOSIS — K209 Esophagitis, unspecified without bleeding: Secondary | ICD-10-CM | POA: Diagnosis not present

## 2021-07-22 DIAGNOSIS — G4733 Obstructive sleep apnea (adult) (pediatric): Secondary | ICD-10-CM | POA: Diagnosis not present

## 2021-07-22 DIAGNOSIS — K222 Esophageal obstruction: Secondary | ICD-10-CM | POA: Diagnosis not present

## 2021-07-22 DIAGNOSIS — R7401 Elevation of levels of liver transaminase levels: Secondary | ICD-10-CM | POA: Diagnosis not present

## 2021-07-22 DIAGNOSIS — Z86718 Personal history of other venous thrombosis and embolism: Secondary | ICD-10-CM | POA: Diagnosis not present

## 2021-07-22 DIAGNOSIS — Z800 Family history of malignant neoplasm of digestive organs: Secondary | ICD-10-CM | POA: Diagnosis not present

## 2021-07-22 DIAGNOSIS — K2100 Gastro-esophageal reflux disease with esophagitis, without bleeding: Secondary | ICD-10-CM | POA: Diagnosis not present

## 2021-07-22 DIAGNOSIS — R12 Heartburn: Secondary | ICD-10-CM | POA: Diagnosis not present

## 2021-07-22 DIAGNOSIS — R1310 Dysphagia, unspecified: Secondary | ICD-10-CM | POA: Diagnosis not present

## 2021-07-22 DIAGNOSIS — K5900 Constipation, unspecified: Secondary | ICD-10-CM | POA: Diagnosis not present

## 2021-07-22 DIAGNOSIS — Z79899 Other long term (current) drug therapy: Secondary | ICD-10-CM | POA: Diagnosis not present

## 2021-07-22 DIAGNOSIS — K219 Gastro-esophageal reflux disease without esophagitis: Secondary | ICD-10-CM | POA: Diagnosis not present

## 2021-07-27 ENCOUNTER — Telehealth: Payer: Self-pay

## 2021-07-27 NOTE — Telephone Encounter (Signed)
Spoken to patient and appointment have been scheduled.

## 2021-07-27 NOTE — Telephone Encounter (Signed)
Can we add as a telephone call at 4:15 tomorrow afternoon?  Salvatore Marvel, MD [facility]

## 2021-07-27 NOTE — Telephone Encounter (Signed)
Patient called stating she would like to do a referral to to see Dr Gavin Pound (Rheumatology).   She also would like Dr Ernst Bowler to give her a call to discuss the labs she did with her GI provider at [facility]. Please see them in [place]. She is having a GI flare up.

## 2021-07-28 ENCOUNTER — Ambulatory Visit (INDEPENDENT_AMBULATORY_CARE_PROVIDER_SITE_OTHER): Payer: 59 | Admitting: Allergy & Immunology

## 2021-07-28 ENCOUNTER — Encounter: Payer: Self-pay | Admitting: Allergy & Immunology

## 2021-07-28 VITALS — Ht 67.5 in | Wt 203.0 lb

## 2021-07-28 DIAGNOSIS — R0602 Shortness of breath: Secondary | ICD-10-CM

## 2021-07-28 DIAGNOSIS — R197 Diarrhea, unspecified: Secondary | ICD-10-CM | POA: Diagnosis not present

## 2021-07-28 DIAGNOSIS — R7982 Elevated C-reactive protein (CRP): Secondary | ICD-10-CM | POA: Diagnosis not present

## 2021-07-28 DIAGNOSIS — M2550 Pain in unspecified joint: Secondary | ICD-10-CM

## 2021-07-28 DIAGNOSIS — J3089 Other allergic rhinitis: Secondary | ICD-10-CM

## 2021-07-28 DIAGNOSIS — T7840XD Allergy, unspecified, subsequent encounter: Secondary | ICD-10-CM

## 2021-07-28 DIAGNOSIS — R682 Dry mouth, unspecified: Secondary | ICD-10-CM

## 2021-07-28 NOTE — Progress Notes (Signed)
RE: Karen [name] MRN: [id number] DOB: [birth year] Date of Telemedicine Visit: 07/28/2021  Referring provider: Leeroy Cha,* Primary care provider: Leeroy Cha, MD  Chief Complaint: Asthma (Has had to use her albuterol inhaler a couple of times due to air quality ) and Allergic Rhinitis  (Takes elderberry syrup for cough, runny nose. Still having throat spasms and IBS flares )   Telemedicine Follow Up Visit via Telephone: I connected with Karen [name] for a follow up on 07/29/21 by telephone and verified that I am speaking with the correct person using two identifiers.   I discussed the limitations, risks, security and privacy concerns of performing an evaluation and management service by telephone and the availability of in person appointments. I also discussed with the patient that there may be a patient responsible charge related to this service. The patient expressed understanding and agreed to proceed.  Patient is at home.  Provider is at the office.  Visit start time: 4:20 PM Visit end time: 4:40 PM Insurance consent/check in by: [place] consent and medical assistant/nurse: Verdis Frederickson  History of Present Illness:  She is a 51 y.o. female, who is being followed for shortness of breath as well as diarrhea and GERD. Her previous allergy office visit was in June 2023 with myself.  Last saw her in June 2023.  At that time, she had a constellation of symptoms that I was not convinced was related to allergies at all.  We did not do lung testing.  I felt that this was not asthma.  For her dysphagia, I discussed eosinophilic esophagitis and agreed with the plan to do an endoscopy.  For her allergic reactions, we got a serum tryptase as well as an autoimmune work-up due to concern for scleroderma.  All of these labs were normal aside from a slightly elevated CRP of 11.  Since the last visit, she has mostly done well.   She woke up last Wednesday around 3 in the  morning. She felt that she had to go to the bathroom. Around 5:30, she had diarrhea and severe abdominal pain. She felt that things were previously going well.  But over the course of the day, she had persistent diarrhea. She called GI and was told that she might have overflow from constipation. She was told to take a dose of Amitiza.  She went to see GI in [place].  She is followed by Dr. Sebastian Ache, a fellow in the GI department.  They did a KUB and labs. Ferritin was elevated, but decreased from previous ferritin levels. KUB was ok with a nonobstructive bowel gas pattern.  There was no significant colonic stool burden.  She was told to do Miralax every other day. She has been out of it since last Wednesday. She woke up with severe abdominal pain and she was shaking. She felt that she was having a reaction to the medication. She may have had a temperature and she had chills. She woke up later on Friday morning with sweats and the fever had broken.   She reviewed her diet. She is having issues with constipation from her IBS now. She had a SIBO test in November that was normal. This is done with a hydrogen breath test (done at [facility]).   She has not gotten an appointment with a Rheumatologist. She wants to see Dr. Gavin Pound. Her husband sees her so she wants to get in to see her as well.  She was diagnosed with hemochromatosis.  She is a carrier of the gene. She had normal LFTs thankfully. Her mother had some "blood issues" and cancer; she has since passed away.   Asthma/Respiratory Symptom History: She has used her inhaler 1-2 times, but nothing extensive.  She has not been on prednisone nor she been to the emergency room for symptoms.  Allergic Rhinitis Symptom History: She uses elderberry and levocetirizine.  This seems to be controlling her symptoms very well.  Otherwise, there have been no changes to her past medical history, surgical history, family history, or social  history.  Assessment and Plan:  Diksha is a 50 y.o. female with:  SOB (shortness of breath) - largely resolved   Diarrhea - with a history of SIBO s/p treatment by Dr. 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 - sending to see Dr. Gavin Pound   Elevated CRP   We are going to go ahead and refer her to Dr. Gavin Pound, per patient request.  I think this is a good idea because I frankly am at a loss as to what is going on with her.  A lot of her symptoms are consistent with scleroderma, although the GI stuff does not necessarily fit.  We will get in that referral and send recent notes and labs.  Diagnostics: None.  Medication List:  Current Outpatient Medications  Medication Sig Dispense Refill   albuterol (VENTOLIN HFA) 108 (90 Base) MCG/ACT inhaler Inhale 2 puffs into the lungs every 6 (six) hours as needed for wheezing or shortness of breath.     Elderberry 575 MG/5ML SYRP Take 30 mLs by mouth daily.     famotidine (PEPCID) 20 MG tablet Take 1 tablet (20 mg total) by mouth 2 (two) times daily. 120 tablet 5   hydrocortisone-nystatin-diphenhydrAMINE Rinse with 5 mLs for 1 to 2 minutes by mouth 4 (four) times daily then swallow, use for 1 week 250 mL 5   Lactobacillus Casei-Folic Acid (RESTORA RX) 60-1.25 MG CAPS Take 1 capsule by mouth daily between meals 30 capsule 5   levocetirizine (XYZAL) 5 MG tablet Take 1 tablet (5 mg total) by mouth every evening. (Patient taking differently: Take 5 mg by mouth daily as needed for allergies.) 30 tablet 5   Lifitegrast (XIIDRA) 5 % SOLN Place 1 drop into both eyes 2 (two) times daily. 60 each PRN   polyethylene glycol (MIRALAX / GLYCOLAX) 17 g packet Take 17 g by mouth every other day.     lubiprostone (AMITIZA) 24 MCG capsule Take 1 capsule (24 mcg total) by mouth 2 (two)  times daily with a meal. (Patient not taking: Reported on 07/28/2021) 60 capsule 5   No current facility-administered medications for this visit.   Allergies: Allergies  Allergen Reactions   Molds & Smuts Cough    sneezing   I reviewed her past medical history, social history, family history, and environmental history and no significant changes have been reported from previous visits.  Review of Systems  Constitutional:  Negative for activity change and appetite change.  HENT:  Negative for congestion, postnasal drip, rhinorrhea, sinus pressure and sore throat.   Eyes:  Negative for pain, discharge, redness and itching.  Respiratory:  Negative for shortness of breath, wheezing and stridor.   Gastrointestinal:  Positive for abdominal pain, constipation and diarrhea. Negative for abdominal distention, nausea  and vomiting.  Musculoskeletal:  Negative for arthralgias, joint swelling and myalgias.  Skin:  Negative for rash.  Allergic/Immunologic: Negative for environmental allergies and food allergies.    Objective:  Physical exam not obtained as encounter was done via telephone.   Previous notes and tests were reviewed.  I discussed the assessment and treatment plan with the patient. The patient was provided an opportunity to ask questions and all were answered. The patient agreed with the plan and demonstrated an understanding of the instructions.   The patient was advised to call back or seek an in-person evaluation if the symptoms worsen or if the condition fails to improve as anticipated.  I provided 20 minutes of non-face-to-face time during this encounter.  It was my pleasure to participate in Eagle Boord's care today. Please feel free to contact me with any questions or concerns.   Sincerely,  Valentina Shaggy, MD

## 2021-08-03 DIAGNOSIS — Z23 Encounter for immunization: Secondary | ICD-10-CM | POA: Diagnosis not present

## 2021-08-03 DIAGNOSIS — Z13220 Encounter for screening for lipoid disorders: Secondary | ICD-10-CM | POA: Diagnosis not present

## 2021-08-03 DIAGNOSIS — J4520 Mild intermittent asthma, uncomplicated: Secondary | ICD-10-CM | POA: Diagnosis not present

## 2021-08-03 DIAGNOSIS — Z0000 Encounter for general adult medical examination without abnormal findings: Secondary | ICD-10-CM | POA: Diagnosis not present

## 2021-08-03 DIAGNOSIS — K219 Gastro-esophageal reflux disease without esophagitis: Secondary | ICD-10-CM | POA: Diagnosis not present

## 2021-08-03 DIAGNOSIS — J301 Allergic rhinitis due to pollen: Secondary | ICD-10-CM | POA: Diagnosis not present

## 2021-08-03 DIAGNOSIS — Z1211 Encounter for screening for malignant neoplasm of colon: Secondary | ICD-10-CM | POA: Diagnosis not present

## 2021-08-03 DIAGNOSIS — Z8616 Personal history of COVID-19: Secondary | ICD-10-CM | POA: Diagnosis not present

## 2021-08-03 DIAGNOSIS — K582 Mixed irritable bowel syndrome: Secondary | ICD-10-CM | POA: Diagnosis not present

## 2021-08-03 DIAGNOSIS — N200 Calculus of kidney: Secondary | ICD-10-CM | POA: Diagnosis not present

## 2021-08-03 DIAGNOSIS — Z1231 Encounter for screening mammogram for malignant neoplasm of breast: Secondary | ICD-10-CM | POA: Diagnosis not present

## 2021-08-04 ENCOUNTER — Telehealth (HOSPITAL_BASED_OUTPATIENT_CLINIC_OR_DEPARTMENT_OTHER): Payer: Self-pay | Admitting: Obstetrics & Gynecology

## 2021-08-04 DIAGNOSIS — N6331 Unspecified lump in axillary tail of the right breast: Secondary | ICD-10-CM

## 2021-08-04 NOTE — Telephone Encounter (Signed)
Pt called stating that she has noticed a nodule on the right side of her chest near her armpit. She states she had a mammogram about a month ago and it was fine. Advised that Dr. Sabra Heck did try to return her call yesterday regarding this but is now out of town. Advised that per Dr. Sabra Heck, she could have a diagnostic mammogram and ultrasound done or she can make an appt with one of our covering providers next week. Pt wishes to proceed with diagnostic mammogram. Order placed.

## 2021-08-04 NOTE — Telephone Encounter (Signed)
Patient call and would like for some one to please call her.

## 2021-08-11 ENCOUNTER — Ambulatory Visit
Admission: RE | Admit: 2021-08-11 | Discharge: 2021-08-11 | Disposition: A | Discharge status: Home / Self Care | Payer: 59 | Source: Ambulatory Visit | Attending: Obstetrics & Gynecology | Admitting: Obstetrics & Gynecology

## 2021-08-11 ENCOUNTER — Other Ambulatory Visit (HOSPITAL_COMMUNITY): Payer: Self-pay

## 2021-08-11 ENCOUNTER — Encounter (INDEPENDENT_AMBULATORY_CARE_PROVIDER_SITE_OTHER): Payer: Self-pay

## 2021-08-11 DIAGNOSIS — R928 Other abnormal and inconclusive findings on diagnostic imaging of breast: Secondary | ICD-10-CM | POA: Diagnosis not present

## 2021-08-11 DIAGNOSIS — N6001 Solitary cyst of right breast: Secondary | ICD-10-CM | POA: Diagnosis not present

## 2021-08-11 DIAGNOSIS — N6331 Unspecified lump in axillary tail of the right breast: Secondary | ICD-10-CM

## 2021-08-11 MED ORDER — RESTORA RX 60-1.25 MG PO CAPS
1.0000 | ORAL_CAPSULE | Freq: Every day | ORAL | 5 refills | Status: DC
  Filled 2021-08-11: qty 30, 30d supply, fill #0
  Filled 2021-09-10: qty 30, 30d supply, fill #1

## 2021-08-11 MED ORDER — RESTORA RX 60-1.25 MG PO CAPS
1.0000 | ORAL_CAPSULE | Freq: Every day | ORAL | 5 refills | Status: DC
  Filled 2021-08-11 – 2021-10-07 (×2): qty 30, 30d supply, fill #0
  Filled 2021-12-24: qty 30, 30d supply, fill #1
  Filled 2022-05-20: qty 30, 30d supply, fill #2
  Filled 2022-06-29: qty 30, 30d supply, fill #3
  Filled 2022-07-25: qty 30, 30d supply, fill #4

## 2021-09-01 ENCOUNTER — Telehealth: Payer: Self-pay

## 2021-09-01 NOTE — Telephone Encounter (Signed)
Received a letter from [facility] that patient has an appointment with them on 09/30/21 @ 9:00am  Rheumatology [phone]

## 2021-09-10 ENCOUNTER — Other Ambulatory Visit (HOSPITAL_COMMUNITY): Payer: Self-pay

## 2021-09-17 ENCOUNTER — Other Ambulatory Visit (HOSPITAL_COMMUNITY): Payer: Self-pay

## 2021-09-22 ENCOUNTER — Other Ambulatory Visit (HOSPITAL_COMMUNITY): Payer: Self-pay

## 2021-09-23 DIAGNOSIS — R5383 Other fatigue: Secondary | ICD-10-CM | POA: Diagnosis not present

## 2021-09-23 DIAGNOSIS — K209 Esophagitis, unspecified without bleeding: Secondary | ICD-10-CM | POA: Diagnosis not present

## 2021-09-23 DIAGNOSIS — R7982 Elevated C-reactive protein (CRP): Secondary | ICD-10-CM | POA: Diagnosis not present

## 2021-09-23 DIAGNOSIS — M79642 Pain in left hand: Secondary | ICD-10-CM | POA: Diagnosis not present

## 2021-09-23 DIAGNOSIS — M3500 Sicca syndrome, unspecified: Secondary | ICD-10-CM | POA: Diagnosis not present

## 2021-09-23 DIAGNOSIS — K580 Irritable bowel syndrome with diarrhea: Secondary | ICD-10-CM | POA: Diagnosis not present

## 2021-09-23 DIAGNOSIS — M79641 Pain in right hand: Secondary | ICD-10-CM | POA: Diagnosis not present

## 2021-09-23 DIAGNOSIS — Z6833 Body mass index (BMI) 33.0-33.9, adult: Secondary | ICD-10-CM | POA: Diagnosis not present

## 2021-09-24 DIAGNOSIS — R109 Unspecified abdominal pain: Secondary | ICD-10-CM | POA: Diagnosis not present

## 2021-09-24 DIAGNOSIS — K5900 Constipation, unspecified: Secondary | ICD-10-CM | POA: Diagnosis not present

## 2021-10-07 ENCOUNTER — Other Ambulatory Visit (HOSPITAL_COMMUNITY): Payer: Self-pay

## 2021-10-08 ENCOUNTER — Ambulatory Visit (HOSPITAL_BASED_OUTPATIENT_CLINIC_OR_DEPARTMENT_OTHER): Payer: 59 | Admitting: Obstetrics & Gynecology

## 2021-10-08 ENCOUNTER — Encounter (HOSPITAL_BASED_OUTPATIENT_CLINIC_OR_DEPARTMENT_OTHER): Payer: Self-pay | Admitting: Obstetrics & Gynecology

## 2021-10-08 VITALS — BP 134/79 | HR 68 | Ht 67.5 in | Wt 210.4 lb

## 2021-10-08 DIAGNOSIS — N6320 Unspecified lump in the left breast, unspecified quadrant: Secondary | ICD-10-CM

## 2021-10-08 DIAGNOSIS — N6310 Unspecified lump in the right breast, unspecified quadrant: Secondary | ICD-10-CM | POA: Diagnosis not present

## 2021-10-08 DIAGNOSIS — N951 Menopausal and female climacteric states: Secondary | ICD-10-CM

## 2021-10-08 DIAGNOSIS — L680 Hirsutism: Secondary | ICD-10-CM | POA: Diagnosis not present

## 2021-10-09 NOTE — Progress Notes (Signed)
GYNECOLOGY  VISIT  CC:   concerns about bilateral breast masses  HPI: 51 y.o. G2P2 Married [place] American female here for concerns about bilateral breat masses.  Pt did have normal mammogram earlier this year and then came for appt with concerns about breat mass on right.  Possible oil cyst noted on imaging.  She feels this lesion has increased in size and now there is one on the left breat as well.  She has hx of hysterectomy but is noticing some changes including hair growth on face as well as issues with GI function.  Motility is very slow.  Unsure if any of this is related to menopause.  Had hysterectomy so has no bleeding.  D/w pt if menopausal, then endometriosis contributing is less likely.  Being followed at [facility] and reports provider just can't seem to make any progress with GI issues.  Discussed testing for menopause.  She is in agreement with this.  Will also check testosterone level given facial hair changes.  She will return for this next week.    Past Medical History:  Diagnosis Date   Adenomyosis    Anemia    Arthritis 2013   L knee gets steroid injections    Asthmatic bronchitis 01/31/2017   Back pain    Chronic constipation    Diarrhea    DVT of lower extremity (deep venous thrombosis) (HCC)    Dysmenorrhea    Endometriosis    Esophageal stricture    GERD (gastroesophageal reflux disease)    Hiatal hernia    History of uterine fibroid    IBS (irritable bowel syndrome)    Interstitial cystitis    Kidney stones    Lactose intolerance    Migraine    with aura   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   Recurrent upper respiratory infection (URI)    Sebaceous cyst of breast, right lower inner quadrant 10/25/2012   Excised 11/21/12    Sleep apnea    Syncope, non cardiac     MEDS:   Current Outpatient Medications on File Prior to Visit  Medication Sig Dispense Refill   albuterol (VENTOLIN HFA) 108 (90 Base) MCG/ACT inhaler Inhale 2 puffs into the lungs every 6  (six) hours as needed for wheezing or shortness of breath.     Elderberry 575 MG/5ML SYRP Take 30 mLs by mouth daily.     famotidine (PEPCID) 20 MG tablet Take 1 tablet (20 mg total) by mouth 2 (two) times daily. 120 tablet 5   Lactobacillus Casei-Folic Acid (RESTORA RX) 60-1.25 MG CAPS Take 1 capsule by mouth daily between meals. 30 capsule 5   Lactobacillus Casei-Folic Acid (RESTORA RX) 60-1.25 MG CAPS Take 1 capsule by mouth daily between meals 30 capsule 5   levocetirizine (XYZAL) 5 MG tablet Take 1 tablet (5 mg total) by mouth every evening. (Patient taking differently: Take 5 mg by mouth daily as needed for allergies.) 30 tablet 5   Lifitegrast (XIIDRA) 5 % SOLN Place 1 drop into both eyes 2 (two) times daily. 60 each PRN   polyethylene glycol (MIRALAX / GLYCOLAX) 17 g packet Take 17 g by mouth every other day.     No current facility-administered medications on file prior to visit.    ALLERGIES: Molds & smuts  SH:  married, non smoker  Review of Systems  Constitutional: Negative.     PHYSICAL EXAMINATION:    BP 134/79 (BP Location: Right Arm, Patient Position: Sitting, Cuff Size: Large)   Pulse 68  Ht 5' 7.5" (1.715 m) Comment: Reported  Wt 210 lb 6.4 oz (95.4 kg)   BMI 32.47 kg/m     Physical Exam Constitutional:      Appearance: Normal appearance.  Chest:  Breasts:    Right: Mass present. No inverted nipple, nipple discharge or tenderness.     Left: Mass present. No nipple discharge or tenderness.    Lymphadenopathy:     Upper Body:     Right upper body: No supraclavicular, axillary or pectoral adenopathy.     Left upper body: No supraclavicular, axillary or pectoral adenopathy.  Neurological:     Mental Status: She is alert.     Assessment/Plan: 1. Masses of both breasts - discussed with pt all of these lesions feel like sebaceous cysts.  Referral to plastic surgeon or general surgeon for removal suggested.  She really desires imaging on both sides.  Will  proceed with this first and then consider referral for consult for removal if desires.   - MM DIAG BREAST TOMO BILATERAL; Future - US BREAST LTD UNI LEFT INC AXILLA; Future - US BREAST LTD UNI RIGHT INC AXILLA; Future  2. Menopausal symptoms - Estradiol; Future - Follicle stimulating hormone; Future  3. Hirsutism - Testosterone, Total, LC/MS/MS; Future

## 2021-10-11 ENCOUNTER — Other Ambulatory Visit (HOSPITAL_BASED_OUTPATIENT_CLINIC_OR_DEPARTMENT_OTHER): Payer: 59

## 2021-10-11 DIAGNOSIS — L680 Hirsutism: Secondary | ICD-10-CM | POA: Diagnosis not present

## 2021-10-11 DIAGNOSIS — N951 Menopausal and female climacteric states: Secondary | ICD-10-CM | POA: Diagnosis not present

## 2021-10-13 LAB — ESTRADIOL: Estradiol: 10.7 pg/mL

## 2021-10-13 LAB — TESTOSTERONE, TOTAL, LC/MS/MS: Testosterone, total: 21.6 ng/dL

## 2021-10-13 LAB — FOLLICLE STIMULATING HORMONE: FSH: 89.9 m[IU]/mL

## 2021-10-14 ENCOUNTER — Emergency Department (HOSPITAL_BASED_OUTPATIENT_CLINIC_OR_DEPARTMENT_OTHER): Payer: 59

## 2021-10-14 ENCOUNTER — Other Ambulatory Visit: Payer: Self-pay

## 2021-10-14 ENCOUNTER — Emergency Department (HOSPITAL_BASED_OUTPATIENT_CLINIC_OR_DEPARTMENT_OTHER): Payer: 59 | Admitting: Radiology

## 2021-10-14 ENCOUNTER — Encounter (HOSPITAL_BASED_OUTPATIENT_CLINIC_OR_DEPARTMENT_OTHER): Payer: Self-pay | Admitting: Emergency Medicine

## 2021-10-14 ENCOUNTER — Emergency Department (HOSPITAL_BASED_OUTPATIENT_CLINIC_OR_DEPARTMENT_OTHER)
Admission: EM | Admit: 2021-10-14 | Discharge: 2021-10-14 | Disposition: A | Discharge status: Home / Self Care | Payer: 59 | Attending: Emergency Medicine | Admitting: Emergency Medicine

## 2021-10-14 DIAGNOSIS — K219 Gastro-esophageal reflux disease without esophagitis: Secondary | ICD-10-CM | POA: Diagnosis not present

## 2021-10-14 DIAGNOSIS — R0602 Shortness of breath: Secondary | ICD-10-CM | POA: Diagnosis not present

## 2021-10-14 DIAGNOSIS — R42 Dizziness and giddiness: Secondary | ICD-10-CM | POA: Diagnosis not present

## 2021-10-14 DIAGNOSIS — R0789 Other chest pain: Secondary | ICD-10-CM | POA: Diagnosis not present

## 2021-10-14 DIAGNOSIS — R079 Chest pain, unspecified: Secondary | ICD-10-CM | POA: Diagnosis not present

## 2021-10-14 DIAGNOSIS — M79662 Pain in left lower leg: Secondary | ICD-10-CM | POA: Diagnosis not present

## 2021-10-14 DIAGNOSIS — M7989 Other specified soft tissue disorders: Secondary | ICD-10-CM | POA: Diagnosis not present

## 2021-10-14 DIAGNOSIS — R070 Pain in throat: Secondary | ICD-10-CM | POA: Diagnosis not present

## 2021-10-14 LAB — COMPREHENSIVE METABOLIC PANEL
ALT: 17 U/L (ref 0–44)
AST: 18 U/L (ref 15–41)
Albumin: 4.3 g/dL (ref 3.5–5.0)
Alkaline Phosphatase: 76 U/L (ref 38–126)
Anion gap: 7 (ref 5–15)
BUN: 11 mg/dL (ref 6–20)
CO2: 27 mmol/L (ref 22–32)
Calcium: 9.6 mg/dL (ref 8.9–10.3)
Chloride: 104 mmol/L (ref 98–111)
Creatinine, Ser: 0.8 mg/dL (ref 0.44–1.00)
GFR, Estimated: >60 mL/min (ref >60)
Glucose, Bld: 94 mg/dL (ref 70–99)
Potassium: 4 mmol/L (ref 3.5–5.1)
Sodium: 138 mmol/L (ref 135–145)
Total Bilirubin: 0.7 mg/dL (ref 0.3–1.2)
Total Protein: 7.6 g/dL (ref 6.5–8.1)

## 2021-10-14 LAB — CBC
HCT: 40.7 % (ref 36.0–46.0)
Hemoglobin: 13.6 g/dL (ref 12.0–15.0)
MCH: 33.3 pg (ref 26.0–34.0)
MCHC: 33.4 g/dL (ref 30.0–36.0)
MCV: 99.5 fL (ref 80.0–100.0)
Platelets: 204 10*3/uL (ref 150–400)
RBC: 4.09 MIL/uL (ref 3.87–5.11)
RDW: 12.2 % (ref 11.5–15.5)
WBC: 5.5 10*3/uL (ref 4.0–10.5)
nRBC: 0 % (ref 0.0–0.2)

## 2021-10-14 LAB — TROPONIN I (HIGH SENSITIVITY): Troponin I (High Sensitivity): <2 ng/L (ref <18)

## 2021-10-14 LAB — LIPASE, BLOOD: Lipase: 33 U/L (ref 11–51)

## 2021-10-14 MED ORDER — FENTANYL CITRATE PF 50 MCG/ML IJ SOSY
50.0000 ug | PREFILLED_SYRINGE | Freq: Once | INTRAMUSCULAR | Status: AC
  Administered 2021-10-14: 50 ug via INTRAVENOUS
  Filled 2021-10-14: qty 1

## 2021-10-14 MED ORDER — ONDANSETRON HCL 4 MG/2ML IJ SOLN
4.0000 mg | Freq: Once | INTRAMUSCULAR | Status: AC
  Administered 2021-10-14: 4 mg via INTRAVENOUS
  Filled 2021-10-14: qty 2

## 2021-10-14 MED ORDER — IOHEXOL 350 MG/ML SOLN
100.0000 mL | Freq: Once | INTRAVENOUS | Status: AC | PRN
  Administered 2021-10-14: 75 mL via INTRAVENOUS

## 2021-10-14 MED ORDER — SODIUM CHLORIDE 0.9 % IV BOLUS
1000.0000 mL | Freq: Once | INTRAVENOUS | Status: AC
  Administered 2021-10-14: 1000 mL via INTRAVENOUS

## 2021-10-14 NOTE — ED Provider Notes (Signed)
[name] Provider Note   CSN: [id number] Arrival date & time: 10/14/21  1815     History  Chief Complaint  Patient presents with   Shortness of Breath    Karen [name] is a 51 y.o. female.   Shortness of Breath Patient is a 51 year old female with past medical history significant for hemochromatosis, CP, asthmatic bronchitis  Patient is presenting to the emergency room today with complaints of some shortness of breath over the past 2 weeks.  She states that today around 4 PM she fell like she had some pressure and tightness in her chest.  She states it is right-sided constant dull ache does not seem to be worse with walking or exerting herself but does seem to be somewhat worse when she takes a deep breath and also when she moves her arms and torso around.  She denies any hemoptysis however she does has a history of DVT when she was 51 years old.  She has noticed that her left leg seems somewhat more swollen.  However it is not significantly painful.  She is not a smoker has no history of smoking.  She endorses some nausea but no vomiting.  No lightheadedness or dizziness.  No other associate symptoms      Home Medications Prior to Admission medications   Medication Sig Start Date End Date Taking? Authorizing Provider  albuterol (VENTOLIN HFA) 108 (90 Base) MCG/ACT inhaler Inhale 2 puffs into the lungs every 6 (six) hours as needed for wheezing or shortness of breath.    [provider]  Elderberry 575 MG/5ML SYRP Take 30 mLs by mouth daily.    [provider]  famotidine (PEPCID) 20 MG tablet Take 1 tablet (20 mg total) by mouth 2 (two) times daily. 05/11/21   Leta Baptist, MD  Lactobacillus Casei-Folic Acid (RESTORA RX) 60-1.25 MG CAPS Take 1 capsule by mouth daily between meals. 08/11/21     Lactobacillus Casei-Folic Acid (RESTORA RX) 60-1.25 MG CAPS Take 1 capsule by mouth daily between meals 08/11/21     levocetirizine (XYZAL) 5  MG tablet Take 1 tablet (5 mg total) by mouth every evening. Patient taking differently: Take 5 mg by mouth daily as needed for allergies. 04/25/17   Bobbitt, Sedalia Muta, MD  Lifitegrast Shirley Fri[place]) 5 % SOLN Place 1 drop into both eyes 2 (two) times daily. 12/21/20     polyethylene glycol (MIRALAX / GLYCOLAX) 17 g packet Take 17 g by mouth every other day.    [provider]      Allergies    Molds & smuts    Review of Systems   Review of Systems  Respiratory:  Positive for shortness of breath.     Physical Exam Updated Vital Signs BP 127/85   Pulse (!) 59   Temp 97.7 F (36.5 C) (Oral)   Resp 14   SpO2 99%  Physical Exam Vitals and nursing note reviewed.  Constitutional:      General: She is not in acute distress. HENT:     Head: Normocephalic and atraumatic.     Nose: Nose normal.     Mouth/Throat:     Mouth: Mucous membranes are moist.  Eyes:     General: No scleral icterus. Cardiovascular:     Rate and Rhythm: Normal rate and regular rhythm.     Pulses: Normal pulses.     Heart sounds: Normal heart sounds.  Pulmonary:     Effort: Pulmonary effort is normal. No  respiratory distress.     Breath sounds: No wheezing.  Abdominal:     Palpations: Abdomen is soft.     Tenderness: There is no abdominal tenderness.  Musculoskeletal:     Cervical back: Normal range of motion.     Right lower leg: No edema.     Left lower leg: No edema.     Comments: No obvious asymmetry of legs.  No pitting edema or edema in lower extremities.   Skin:    General: Skin is warm and dry.     Capillary Refill: Capillary refill takes less than 2 seconds.  Neurological:     Mental Status: She is alert. Mental status is at baseline.  Psychiatric:        Mood and Affect: Mood normal.        Behavior: Behavior normal.     ED Results / Procedures / Treatments   Labs (all labs ordered are listed, but only abnormal results are displayed) Labs Reviewed  CBC  COMPREHENSIVE  METABOLIC PANEL  LIPASE, BLOOD  TROPONIN I (HIGH SENSITIVITY)    EKG None  Radiology CT Angio Chest PE W/Cm &/Or Wo Cm  Result Date: 10/14/2021 CLINICAL DATA:  Pulmonary embolus suspected with high probability. Left leg swelling, shortness of breath, and chest pain starting 2 weeks ago. History of hemochromatosis. History of DVT. EXAM: CT ANGIOGRAPHY CHEST WITH CONTRAST TECHNIQUE: Multidetector CT imaging of the chest was performed using the standard protocol during bolus administration of intravenous contrast. Multiplanar CT image reconstructions and MIPs were obtained to evaluate the vascular anatomy. RADIATION DOSE REDUCTION: This exam was performed according to the departmental dose-optimization program which includes automated exposure control, adjustment of the mA and/or kV according to patient size and/or use of iterative reconstruction technique. CONTRAST:  60 mL OMNIPAQUE IOHEXOL 350 MG/ML SOLN COMPARISON:  10/24/2012 FINDINGS: Cardiovascular: Good opacification of the central and segmental pulmonary arteries. No focal filling defects. No evidence of significant pulmonary embolus. Normal heart size. No pericardial effusions. Normal caliber thoracic aorta. No aortic dissection. Great vessel origins are patent. Mediastinum/Nodes: Thyroid gland is unremarkable. Esophagus is decompressed. No significant lymphadenopathy in the chest. Lungs/Pleura: Mild atelectasis in the lung bases. 3 mm nodule in the right middle lung along the fissure is unchanged since prior study. Long-term stability suggests benign etiology. No pleural effusions. No pneumothorax. Upper Abdomen: No acute abnormality. Musculoskeletal: No chest wall abnormality. No acute or significant osseous findings. Review of the MIP images confirms the above findings. IMPRESSION: 1. No evidence of significant pulmonary embolus. 2. No evidence of active pulmonary disease. 3. 3 mm nodule in the right middle lung without change since prior study.  No follow-up needed if patient is low-risk.This recommendation follows the consensus statement: Guidelines for Management of Incidental Pulmonary Nodules Detected on CT Images: From the Fleischner Society 2017; Radiology 2017; 284:228-243. Electronically Signed   By: Lucienne Capers M.D.   On: 10/14/2021 21:32   DG Chest 2 View  Result Date: 10/14/2021 CLINICAL DATA:  Chest pain leg swelling and shortness of breath. EXAM: CHEST - 2 VIEW COMPARISON:  Chest radiograph November 26, 2014. FINDINGS: The heart size and mediastinal contours are within normal limits. No focal consolidation. No pleural effusion. No pneumothorax. No acute osseous abnormality. IMPRESSION: No acute cardiopulmonary disease. Electronically Signed   By: Dahlia Bailiff M.D.   On: 10/14/2021 19:00    Procedures Procedures    Medications Ordered in ED Medications  fentaNYL (SUBLIMAZE) injection 50 mcg (50 mcg Intravenous Given  10/14/21 2032)  ondansetron (ZOFRAN) injection 4 mg (4 mg Intravenous Given 10/14/21 2032)  sodium chloride 0.9 % bolus 1,000 mL (1,000 mLs Intravenous New Bag/Given 10/14/21 2032)  iohexol (OMNIPAQUE) 350 MG/ML injection 100 mL (75 mLs Intravenous Contrast Given 10/14/21 2118)    ED Course/ Medical Decision Making/ A&P Clinical Course as of 10/14/21 2241  Thu Oct 15, 5162  6361 51 years old had DVT  States left leg swelling and SOB, CP   SOB 2 weeks States more of a congested feeling. Some pleuritic pain.   CP 4pm - R sided and constant. Dull ache.  [WF]    Clinical Course User Index [WF] Tedd Sias, PA                           Medical Decision Making Amount and/or Complexity of Data Reviewed Labs: ordered. Radiology: ordered.  Risk Prescription drug management.   This patient presents to the ED for concern of chest pain, this involves a number of treatment options, and is a complaint that carries with it a high risk of complications and morbidity.  The differential diagnosis  includes The emergent causes of chest pain include: Acute coronary syndrome, tamponade, pericarditis/myocarditis, aortic dissection, pulmonary embolism, tension pneumothorax, pneumonia, and esophageal rupture.  Given the patient has a history of DVT we will obtain left lower extremity ultrasound and PET scan.  I do not believe the patient has an emergent cause of chest pain, other urgent/non-acute considerations include, but are not limited to: chronic angina, aortic stenosis, cardiomyopathy, mitral valve prolapse, pulmonary hypertension, aortic insufficiency, right ventricular hypertrophy, pleuritis, bronchitis, pneumothorax, tumor, gastroesophageal reflux disease (GERD), esophageal spasm, Mallory-Weiss syndrome, peptic ulcer disease, pancreatitis, functional gastrointestinal pain, cervical or thoracic disk disease or arthritis, shoulder arthritis, costochondritis, subacromial bursitis, anxiety or panic attack, herpes zoster, breast disorders, chest wall tumors, thoracic outlet syndrome, mediastinitis.    Co morbidities: Discussed in HPI   Brief History:  Patient is a 51 year old female with past medical history significant for hemochromatosis, CP, asthmatic bronchitis  Patient is presenting to the emergency room today with complaints of some shortness of breath over the past 2 weeks.  She states that today around 4 PM she fell like she had some pressure and tightness in her chest.  She states it is right-sided constant dull ache does not seem to be worse with walking or exerting herself but does seem to be somewhat worse when she takes a deep breath and also when she moves her arms and torso around.  She denies any hemoptysis however she does has a history of DVT when she was 51 years old.  She has noticed that her left leg seems somewhat more swollen.  However it is not significantly painful.  She is not a smoker has no history of smoking.  She endorses some nausea but no vomiting.  No  lightheadedness or dizziness.  No other associate symptoms    EMR reviewed including pt PMHx, past surgical history and past visits to ER.   See HPI for more details   Lab Tests:   I personally reviewed all laboratory work and imaging. Metabolic panel without any acute abnormality specifically kidney function within normal limits and no significant electrolyte abnormalities. CBC without leukocytosis or significant anemia. Troponin x1 WNLs - given time frame no need for repeat.   Imaging Studies:  NAD. I personally reviewed all imaging studies and no acute abnormality found. I agree with radiology  interpretation. IMPRESSION:  1. No evidence of significant pulmonary embolus.  2. No evidence of active pulmonary disease.  3. 3 mm nodule in the right middle lung without change since prior  study. No follow-up needed if patient is low-risk.This  recommendation follows the consensus statement: Guidelines for  Management of Incidental Pulmonary Nodules Detected on CT Images:  From the Fleischner Society 2017; Radiology 2017; 284:228-243.      Electronically Signed    By: Lucienne Capers M.D.    On: 10/14/2021 21:32    No need for pulm nodule follow up.  Pt aware of nodule.   Cardiac Monitoring:  The patient was maintained on a cardiac monitor.  I personally viewed and interpreted the cardiac monitored which showed an underlying rhythm of: NSR EKG non-ischemic   Medicines ordered:  I ordered medication including fent for pain, zofran for nausea Reevaluation of the patient after these medicines showed that the patient improved I have reviewed the patients home medicines and have made adjustments as needed   Critical Interventions:     Consults/Attending Physician      Reevaluation:  After the interventions noted above I re-evaluated patient and found that they have :improved   Social Determinants of Health:      Problem List / ED Course:  CP - atypical.  Low risk for ACS higher concern for PE given hx of VTE. PE scan negative. Reassuring workup. Well appearing. DVT US pending. Handed off w plan to DC home.    Dispostion:  Dr. Florina Ou will follow up on LLE [place]  Anticipate DC home after.    Final Clinical Impression(s) / ED Diagnoses Final diagnoses:  Atypical chest pain    Rx / DC Orders ED Discharge Orders     None         Tedd Sias, [place] 10/14/21 2248    Malvin Johns, MD 10/14/21 2345

## 2021-10-14 NOTE — ED Triage Notes (Signed)
Left leg swelling, sob, chest pain Sob started 2 weeks, chest pain started today. Hx of DVT and "high risk markers for clots"  hemochromatosis

## 2021-10-15 DIAGNOSIS — R0602 Shortness of breath: Secondary | ICD-10-CM | POA: Diagnosis not present

## 2021-10-15 DIAGNOSIS — R197 Diarrhea, unspecified: Secondary | ICD-10-CM | POA: Diagnosis not present

## 2021-10-15 DIAGNOSIS — R600 Localized edema: Secondary | ICD-10-CM | POA: Diagnosis not present

## 2021-10-15 DIAGNOSIS — R42 Dizziness and giddiness: Secondary | ICD-10-CM | POA: Diagnosis not present

## 2021-10-21 DIAGNOSIS — K2289 Other specified disease of esophagus: Secondary | ICD-10-CM | POA: Diagnosis not present

## 2021-10-21 DIAGNOSIS — E669 Obesity, unspecified: Secondary | ICD-10-CM | POA: Diagnosis not present

## 2021-10-21 DIAGNOSIS — K222 Esophageal obstruction: Secondary | ICD-10-CM | POA: Diagnosis not present

## 2021-10-21 DIAGNOSIS — K449 Diaphragmatic hernia without obstruction or gangrene: Secondary | ICD-10-CM | POA: Diagnosis not present

## 2021-10-21 DIAGNOSIS — Z6831 Body mass index (BMI) 31.0-31.9, adult: Secondary | ICD-10-CM | POA: Diagnosis not present

## 2021-10-21 DIAGNOSIS — K209 Esophagitis, unspecified without bleeding: Secondary | ICD-10-CM | POA: Diagnosis not present

## 2021-10-21 DIAGNOSIS — K2100 Gastro-esophageal reflux disease with esophagitis, without bleeding: Secondary | ICD-10-CM | POA: Diagnosis not present

## 2021-10-21 DIAGNOSIS — R1310 Dysphagia, unspecified: Secondary | ICD-10-CM | POA: Diagnosis not present

## 2021-10-22 DIAGNOSIS — M79641 Pain in right hand: Secondary | ICD-10-CM | POA: Diagnosis not present

## 2021-10-22 DIAGNOSIS — M3500 Sicca syndrome, unspecified: Secondary | ICD-10-CM | POA: Diagnosis not present

## 2021-10-22 DIAGNOSIS — R809 Proteinuria, unspecified: Secondary | ICD-10-CM | POA: Diagnosis not present

## 2021-10-22 DIAGNOSIS — E669 Obesity, unspecified: Secondary | ICD-10-CM | POA: Diagnosis not present

## 2021-10-22 DIAGNOSIS — R7982 Elevated C-reactive protein (CRP): Secondary | ICD-10-CM | POA: Diagnosis not present

## 2021-10-22 DIAGNOSIS — K209 Esophagitis, unspecified without bleeding: Secondary | ICD-10-CM | POA: Diagnosis not present

## 2021-10-22 DIAGNOSIS — Z6832 Body mass index (BMI) 32.0-32.9, adult: Secondary | ICD-10-CM | POA: Diagnosis not present

## 2021-10-22 DIAGNOSIS — M79642 Pain in left hand: Secondary | ICD-10-CM | POA: Diagnosis not present

## 2021-10-25 ENCOUNTER — Telehealth: Payer: Self-pay | Admitting: Allergy & Immunology

## 2021-10-25 NOTE — Telephone Encounter (Signed)
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 We did spend some time discussing Dupixent. Biopsy results shown below:     She is interested in pursuing the [place]. She is ok with Tammy reaching out to her.   She does have a known history of asthma which was not a problem pre-COVID.   I do not think that this EoE explains her hemochromatosis.  She is having what sounds like peripheral neuropathy. She did talk about referring her to Neurology for evaluation of this. Her PCP is going to manage this.   Salvatore Marvel, MD [facility] [place]

## 2021-10-28 ENCOUNTER — Encounter: Payer: Self-pay | Admitting: Neurology

## 2021-11-03 ENCOUNTER — Ambulatory Visit: Payer: 59 | Admitting: Neurology

## 2021-11-03 ENCOUNTER — Encounter: Payer: Self-pay | Admitting: Neurology

## 2021-11-03 ENCOUNTER — Other Ambulatory Visit: Payer: Self-pay

## 2021-11-03 ENCOUNTER — Ambulatory Visit (INDEPENDENT_AMBULATORY_CARE_PROVIDER_SITE_OTHER): Payer: 59 | Admitting: Neurology

## 2021-11-03 ENCOUNTER — Other Ambulatory Visit (HOSPITAL_COMMUNITY): Payer: Self-pay

## 2021-11-03 VITALS — BP 114/73 | HR 80 | Ht 67.5 in | Wt 207.0 lb

## 2021-11-03 DIAGNOSIS — R202 Paresthesia of skin: Secondary | ICD-10-CM

## 2021-11-03 MED ORDER — DUPIXENT 300 MG/2ML ~~LOC~~ SOSY
300.0000 mg | PREFILLED_SYRINGE | SUBCUTANEOUS | 11 refills | Status: DC
  Filled 2021-11-03: qty 8, 28d supply, fill #0

## 2021-11-03 NOTE — Telephone Encounter (Signed)
Call from patient advised approval, copay card and submit to [facility] for [place]

## 2021-11-03 NOTE — Addendum Note (Signed)
Addended by: Carin Hock on: 11/03/2021 03:28 PM   Modules accepted: Orders

## 2021-11-03 NOTE — Progress Notes (Unsigned)
[facility] Clinic Note - Initial Visit   Date: 11/03/2021   [name] MRN: [id number] DOB: 03-Jan-1971   Dear Dr Marland KitchenLeeroy Cha, MD:  Thank you for your kind referral of Karen [name] for consultation of ***. Although her history is well known to you, please allow [place] to reiterate it for the purpose of our medical record. The patient was accompanied to the clinic by *** who also provides collateral information.     Karen [name] is a 51 y.o. ***-handed female with *** presenting for evaluation of ***.   IMPRESSION/PLAN: ****  Return to clinic in ***  ------------------------------------------------------------- History of present illness: ***  Out-side paper records, electronic medical record, and images have been reviewed where available and summarized as: *** Lab Results  Component Value Date   HGBA1C 4.9 03/21/2019   Lab Results  Component Value Date   WUJWJXBJ47 829 (H) 04/06/2021   Lab Results  Component Value Date   TSH 0.788 03/21/2019   Lab Results  Component Value Date   ESRSEDRATE 27 06/08/2021    Past Medical History:  Diagnosis Date   Adenomyosis    Anemia    Arthritis 2013   L knee gets steroid injections    Asthmatic bronchitis 01/31/2017   Back pain    Chronic constipation    Diarrhea    DVT of lower extremity (deep venous thrombosis) (HCC)    Dysmenorrhea    Endometriosis    Esophageal stricture    GERD (gastroesophageal reflux disease)    Hiatal hernia    History of uterine fibroid    IBS (irritable bowel syndrome)    Interstitial cystitis    Kidney stones    Lactose intolerance    Migraine    with aura   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   Recurrent upper respiratory infection (URI)    Sebaceous cyst of breast, right lower inner quadrant 10/25/2012   Excised 11/21/12    Sleep apnea    Syncope, non cardiac     Past Surgical History:  Procedure Laterality Date   ARTHROSCOPIC  HAGLUNDS REPAIR     BREAST CYST EXCISION Right 11/21/2012   Procedure: CYST EXCISION BREAST;  Surgeon: Haywood Lasso, MD;  Location: [facility];  Service: General;  Laterality: Right;   BREAST EXCISIONAL BIOPSY Right 11/2012   CESAREAN SECTION  04/19/1993   CHOLECYSTECTOMY     COLONOSCOPY  05/02/2011   Procedure: COLONOSCOPY;  Surgeon: Winfield Cunas., MD;  Location: [facility];  Service: Endoscopy;  Laterality: N/A;   colonoscopy  11/04/2014   DENTAL SURGERY  03/06/2018   2 surgeries ( 03/06/2018 and 04/06/2018)   to remove two separate benign tumors   ESOPHAGEAL MANOMETRY N/A 11/04/2015   Procedure: ESOPHAGEAL MANOMETRY (EM);  Surgeon: Laurence Spates, MD;  Location: [facility];  Service: Endoscopy;  Laterality: N/A;   ESOPHAGOGASTRODUODENOSCOPY  05/02/2011   Procedure: ESOPHAGOGASTRODUODENOSCOPY (EGD);  Surgeon: Winfield Cunas., MD;  Location: [facility];  Service: Endoscopy;  Laterality: N/A;   ESOPHAGOGASTRODUODENOSCOPY N/A 09/01/2014   Procedure: ESOPHAGOGASTRODUODENOSCOPY (EGD);  Surgeon: Laurence Spates, MD;  Location: [facility];  Service: Endoscopy;  Laterality: N/A;   KNEE SURGERY     LAPAROSCOPY     x 4   PH IMPEDANCE STUDY N/A 11/04/2015   Procedure: [place] IMPEDANCE STUDY;  Surgeon: Laurence Spates, MD;  Location: [facility];  Service: Endoscopy;  Laterality: N/A;   VAGINAL HYSTERECTOMY  2010   TVH--ovaries remain  Medications:  Outpatient Encounter Medications as of 11/03/2021  Medication Sig   albuterol (VENTOLIN HFA) 108 (90 Base) MCG/ACT inhaler Inhale 2 puffs into the lungs every 6 (six) hours as needed for wheezing or shortness of breath.   Elderberry 575 MG/5ML SYRP Take 30 mLs by mouth daily.   famotidine (PEPCID) 20 MG tablet Take 1 tablet (20 mg total) by mouth 2 (two) times daily.   Lactobacillus Casei-Folic Acid (RESTORA RX) 60-1.25 MG CAPS Take 1 capsule by mouth daily between meals.   Lactobacillus Casei-Folic Acid (RESTORA RX)  60-1.25 MG CAPS Take 1 capsule by mouth daily between meals   levocetirizine (XYZAL) 5 MG tablet Take 1 tablet (5 mg total) by mouth every evening. (Patient taking differently: Take 5 mg by mouth daily as needed for allergies.)   Lifitegrast (XIIDRA) 5 % SOLN Place 1 drop into both eyes 2 (two) times daily.   polyethylene glycol (MIRALAX / GLYCOLAX) 17 g packet Take 17 g by mouth every other day.   No facility-administered encounter medications on file as of 11/03/2021.    Allergies:  Allergies  Allergen Reactions   Molds & Smuts Cough    sneezing    Family History: Family History  Problem Relation Age of Onset   Colon cancer Father 103   Hypertension Father    Stroke Father    Heart disease Father    Heart attack Father    Hyperlipidemia Father    Colon cancer Mother 46   Diabetes Maternal Uncle    Stroke Paternal Grandmother    Heart attack Paternal Grandmother    Heart attack Maternal Grandmother    Heart attack Maternal Grandfather    Cancer Maternal Uncle    Hypertension Sister    Diabetes Brother    Breast cancer Neg Hx     Social History: Social History   Tobacco Use   Smoking status: Never   Smokeless tobacco: Never  Vaping Use   Vaping Use: Never used  Substance Use Topics   Alcohol use: Not Currently   Drug use: No   Social History   Social History Narrative   Are you right handed or left handed? Right Handed    Are you currently employed ? 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 No   Who lives with you? Husband and daughters   What type of home do you live in: 1 story or 2 story? Lives in a two story home         Vital Signs:  BP 114/73   Pulse 80   Ht 5' 7.5" (1.715 m)   Wt 207 lb (93.9 kg)   SpO2 98%   BMI 31.94 kg/m    General Medical Exam:  *** General:  Well appearing, comfortable.   Eyes/ENT: see cranial nerve examination.   Neck:   No carotid bruits. Respiratory:   Clear to auscultation, good air entry bilaterally.   Cardiac:  Regular rate and rhythm, no murmur.   Extremities:  No deformities, edema, or skin discoloration.  Skin:  No rashes or lesions.  Neurological Exam: MENTAL STATUS including orientation to time, place, person, recent and remote memory, attention span and concentration, language, and fund of knowledge is ***normal.  Speech is not dysarthric.  CRANIAL NERVES: II:  No visual field defects.  Unremarkable fundi.   III-IV-VI: Pupils equal round and reactive to light.  Normal conjugate, extra-ocular eye movements  in all directions of gaze.  No nystagmus.  No ptosis***.   V:  Normal facial sensation.    VII:  Normal facial symmetry and movements.   VIII:  Normal hearing and vestibular function.   IX-X:  Normal palatal movement.   XI:  Normal shoulder shrug and head rotation.   XII:  Normal tongue strength and range of motion, no deviation or fasciculation.  MOTOR:  No atrophy, fasciculations or abnormal movements.  No pronator drift.   Upper Extremity:  Right  Left  Deltoid  5/5   5/5   Biceps  5/5   5/5   Triceps  5/5   5/5   Infraspinatus 5/5  5/5  Medial pectoralis 5/5  5/5  Wrist extensors  5/5   5/5   Wrist flexors  5/5   5/5   Finger extensors  5/5   5/5   Finger flexors  5/5   5/5   Dorsal interossei  5/5   5/5   Abductor pollicis  5/5   5/5   Tone (Ashworth scale)  0  0   Lower Extremity:  Right  Left  Hip flexors  5/5   5/5   Hip extensors  5/5   5/5   Adductor 5/5  5/5  Abductor 5/5  5/5  Knee flexors  5/5   5/5   Knee extensors  5/5   5/5   Dorsiflexors  5/5   5/5   Plantarflexors  5/5   5/5   Toe extensors  5/5   5/5   Toe flexors  5/5   5/5   Tone (Ashworth scale)  0  0   MSRs:  Right        Left                  brachioradialis 2+  2+  biceps 2+  2+  triceps 2+  2+  patellar 2+  2+  ankle jerk 2+  2+  Hoffman no  no  plantar response down  down   SENSORY:  Normal and symmetric perception of  light touch, pinprick, vibration, and proprioception.  Romberg's sign absent.   COORDINATION/GAIT: Normal finger-to- nose-finger and heel-to-shin.  Intact rapid alternating movements bilaterally.  Able to rise from a chair without using arms.  Gait narrow based and stable. Tandem and stressed gait intact.    ***   Thank you for allowing me to participate in patient's care.  If I can answer any additional questions, I would be pleased to do so.    Sincerely,    Deanta Mincey K. Posey Pronto, DO

## 2021-11-03 NOTE — Telephone Encounter (Signed)
Thanks Tammy!   Salvatore Marvel, MD [facility]

## 2021-11-03 NOTE — Procedures (Signed)
[facility] Neurology  [address]  [place], [place] [address] Tel: [phone] Fax: [phone] Test Date:  11/03/2021  Patient: Karen [name] DOB: 11-Oct-1970 Physician: Narda Amber, DO  Sex: Female Height: '5\' 7"'$  Ref Phys: Leeroy Cha, MD  ID#: [id number]   Technician:    History: This is a 51 year old female referred for evaluation of bilateral leg pain and paresthesias, worse on the left.  NCV & EMG Findings: Electrodiagnostic testing of the right lower extremity and additional studies of the left shows: Bilateral sural and superficial peroneal sensory responses are within normal limits. Bilateral peroneal and tibial motor responses are within normal limits. Bilateral tibial H reflex studies are within normal limits. There is no evidence of active or chronic motor axonal changes affecting any of the tested muscles.  Motor unit configuration and recruitment pattern is within normal limits.  Impression: This is a normal study of the lower extremities.  In particular, there is no evidence of a large fiber sensorimotor polyneuropathy or lumbosacral radiculopathy.    ___________________________ Narda Amber, DO    Nerve Conduction Studies   Stim Site NR Peak (ms) Norm Peak (ms) O-P Amp (V) Norm O-P Amp  Left Sup Peroneal Anti Sensory (Ant Lat Mall)  32 C  12 cm    2.5 <4.6 12.8 >4  Right Sup Peroneal Anti Sensory (Ant Lat Mall)  32 C  12 cm    2.3 <4.6 18.2 >4  Left Sural Anti Sensory (Lat Mall)  32 C  Calf    3.0 <4.6 10.7 >4  Right Sural Anti Sensory (Lat Mall)  32 C  Calf    3.1 <4.6 9.2 >4     Stim Site NR Onset (ms) Norm Onset (ms) O-P Amp (mV) Norm O-P Amp Site1 Site2 Delta-0 (ms) Dist (cm) Vel (m/s) Norm Vel (m/s)  Left Peroneal Motor (Ext Dig Brev)  32 C  Ankle    3.3 <6.0 5.5 >2.5 B Fib Ankle 7.9 39.0 49 >40  B Fib    11.2  5.4  Poplt B Fib 1.5 8.0 53 >40  Poplt    12.7  4.5         Right Peroneal Motor (Ext Dig Brev)  32 C   Ankle    2.8 <6.0 7.6 >2.5 B Fib Ankle 8.0 38.0 48 >40  B Fib    10.8  6.7  Poplt B Fib 1.6 9.0 56 >40  Poplt    12.4  6.6         Left Tibial Motor (Abd Hall Brev)  32 C  Ankle    4.0 <6.0 8.0 >4 Knee Ankle 8.3 41.0 49 >40  Knee    12.3  5.8         Right Tibial Motor (Abd Hall Brev)  32 C  Ankle    3.6 <6.0 7.8 >4 Knee Ankle 9.1 42.0 46 >40  Knee    12.7  4.4          Electromyography   Side Muscle Ins.Act Fibs Fasc Recrt Amp Dur Poly Activation Comment  Right AntTibialis Nml Nml Nml Nml Nml Nml Nml Nml N/A  Right Gastroc Nml Nml Nml Nml Nml Nml Nml Nml N/A  Right RectFemoris Nml Nml Nml Nml Nml Nml Nml Nml N/A  Right GluteusMed Nml Nml Nml Nml Nml Nml Nml Nml N/A  Left BicepsFemS Nml Nml Nml Nml Nml Nml Nml Nml N/A  Left AntTibialis Nml Nml Nml Nml Nml Nml Nml Nml  N/A  Left Gastroc Nml Nml Nml Nml Nml Nml Nml Nml N/A  Left RectFemoris Nml Nml Nml Nml Nml Nml Nml Nml N/A  Left GluteusMed Nml Nml Nml Nml Nml Nml Nml Nml N/A      Waveforms:

## 2021-11-04 ENCOUNTER — Other Ambulatory Visit (HOSPITAL_COMMUNITY): Payer: Self-pay

## 2021-11-04 DIAGNOSIS — K200 Eosinophilic esophagitis: Secondary | ICD-10-CM | POA: Diagnosis not present

## 2021-11-04 DIAGNOSIS — R1319 Other dysphagia: Secondary | ICD-10-CM | POA: Diagnosis not present

## 2021-11-04 MED ORDER — DEXLANSOPRAZOLE 30 MG PO CPDR
30.0000 mg | DELAYED_RELEASE_CAPSULE | Freq: Two times a day (BID) | ORAL | 0 refills | Status: DC
  Filled 2021-11-04: qty 60, 30d supply, fill #0
  Filled 2021-11-09: qty 180, 90d supply, fill #0

## 2021-11-05 ENCOUNTER — Ambulatory Visit: Payer: 59 | Attending: Internal Medicine | Admitting: Pharmacist

## 2021-11-05 ENCOUNTER — Other Ambulatory Visit (HOSPITAL_COMMUNITY): Payer: Self-pay

## 2021-11-05 DIAGNOSIS — Z7189 Other specified counseling: Secondary | ICD-10-CM

## 2021-11-05 MED ORDER — DUPIXENT 300 MG/2ML ~~LOC~~ SOSY
300.0000 mg | PREFILLED_SYRINGE | SUBCUTANEOUS | 11 refills | Status: DC
  Filled 2021-11-05: qty 8, 28d supply, fill #0

## 2021-11-05 NOTE — Progress Notes (Signed)
   S: Patient presents for review of their specialty medication therapy.  Patient is considering Dupixent for Eosinophilic Esophagitis. Patient is managed by Dr. Nicanor Alcon for this.   Adherence: has not started    Efficacy: has not started   Dosing: 300 mg qweek  Dose adjustments: Renal: no dose adjustments (has not been studied) Hepatic: no dose adjustments (has not been studied)  Drug-drug interactions: none  Monitoring: S/sx of infection: none S/sx of hypersensitivity: none S/sx of ocular effects: none S/sx of eosinophilia/vasculitis: none  O:     Lab Results  Component Value Date   WBC 5.5 10/14/2021   HGB 13.6 10/14/2021   HCT 40.7 10/14/2021   MCV 99.5 10/14/2021   PLT 204 10/14/2021      Chemistry      Component Value Date/Time   NA 138 10/14/2021 2035   NA 140 03/21/2019 1158   K 4.0 10/14/2021 2035   CL 104 10/14/2021 2035   CO2 27 10/14/2021 2035   BUN 11 10/14/2021 2035   BUN 9 03/21/2019 1158   CREATININE 0.80 10/14/2021 2035   CREATININE 0.87 06/03/2021 1351      Component Value Date/Time   CALCIUM 9.6 10/14/2021 2035   ALKPHOS 76 10/14/2021 2035   AST 18 10/14/2021 2035   AST 13 (L) 06/03/2021 1351   ALT 17 10/14/2021 2035   ALT 14 06/03/2021 1351   BILITOT 0.7 10/14/2021 2035   BILITOT 0.5 06/03/2021 1351       A/P: 1. Medication review: Patient considering Dupixent for eos esophagitis. Reviewed the medication with the patient, including the following: Dupixent is a monoclonal antibody used for the treatment of asthma, atopic dermatitis, and eos esophagitis. Patient educated on purpose, proper use and potential adverse effects of Dupixent. Possible adverse effects include increased risk of infection, ocular effects, vasculitis/eosinophilia, and hypersensitivity reactions. Administer as a SubQ injection and rotate sites. Allow the medication to reach room temp prior to administration (45 mins for 300 mg syringe or 30 min for 200 mg  syringe). Do not shake. Discard any unused portion. No recommendations for any changes.  Benard Halsted, PharmD, [place], [place] [phone]

## 2021-11-09 ENCOUNTER — Other Ambulatory Visit (HOSPITAL_COMMUNITY): Payer: Self-pay

## 2021-11-09 ENCOUNTER — Other Ambulatory Visit (HOSPITAL_BASED_OUTPATIENT_CLINIC_OR_DEPARTMENT_OTHER): Payer: Self-pay

## 2021-11-10 ENCOUNTER — Other Ambulatory Visit (HOSPITAL_COMMUNITY): Payer: Self-pay

## 2021-11-10 MED ORDER — LANSOPRAZOLE 30 MG PO CPDR
30.0000 mg | DELAYED_RELEASE_CAPSULE | Freq: Two times a day (BID) | ORAL | 3 refills | Status: DC
  Filled 2021-11-10: qty 90, 45d supply, fill #0

## 2021-11-10 MED ORDER — LANSOPRAZOLE 30 MG PO CPDR
30.0000 mg | DELAYED_RELEASE_CAPSULE | Freq: Two times a day (BID) | ORAL | 0 refills | Status: DC
  Filled 2021-11-10: qty 180, 90d supply, fill #0

## 2021-11-11 ENCOUNTER — Other Ambulatory Visit (HOSPITAL_COMMUNITY): Payer: Self-pay

## 2021-11-11 DIAGNOSIS — R109 Unspecified abdominal pain: Secondary | ICD-10-CM | POA: Diagnosis not present

## 2021-11-11 DIAGNOSIS — R809 Proteinuria, unspecified: Secondary | ICD-10-CM | POA: Diagnosis not present

## 2021-11-15 ENCOUNTER — Other Ambulatory Visit (HOSPITAL_COMMUNITY): Payer: Self-pay

## 2021-11-15 DIAGNOSIS — K200 Eosinophilic esophagitis: Secondary | ICD-10-CM | POA: Diagnosis not present

## 2021-11-15 DIAGNOSIS — J019 Acute sinusitis, unspecified: Secondary | ICD-10-CM | POA: Diagnosis not present

## 2021-11-15 DIAGNOSIS — M79605 Pain in left leg: Secondary | ICD-10-CM | POA: Diagnosis not present

## 2021-11-15 DIAGNOSIS — H8110 Benign paroxysmal vertigo, unspecified ear: Secondary | ICD-10-CM | POA: Diagnosis not present

## 2021-11-22 ENCOUNTER — Ambulatory Visit
Admission: RE | Admit: 2021-11-22 | Discharge: 2021-11-22 | Disposition: A | Discharge status: Home / Self Care | Payer: 59 | Source: Ambulatory Visit | Attending: Obstetrics & Gynecology | Admitting: Obstetrics & Gynecology

## 2021-11-22 DIAGNOSIS — N6310 Unspecified lump in the right breast, unspecified quadrant: Secondary | ICD-10-CM

## 2021-11-22 DIAGNOSIS — R92323 Mammographic fibroglandular density, bilateral breasts: Secondary | ICD-10-CM | POA: Diagnosis not present

## 2021-11-22 DIAGNOSIS — N6322 Unspecified lump in the left breast, upper inner quadrant: Secondary | ICD-10-CM | POA: Diagnosis not present

## 2021-11-22 DIAGNOSIS — N6321 Unspecified lump in the left breast, upper outer quadrant: Secondary | ICD-10-CM | POA: Diagnosis not present

## 2021-11-22 DIAGNOSIS — N6312 Unspecified lump in the right breast, upper inner quadrant: Secondary | ICD-10-CM | POA: Diagnosis not present

## 2021-11-22 DIAGNOSIS — N6311 Unspecified lump in the right breast, upper outer quadrant: Secondary | ICD-10-CM | POA: Diagnosis not present

## 2021-11-23 DIAGNOSIS — M79606 Pain in leg, unspecified: Secondary | ICD-10-CM | POA: Diagnosis not present

## 2021-11-24 ENCOUNTER — Ambulatory Visit (HOSPITAL_COMMUNITY)
Admission: RE | Admit: 2021-11-24 | Discharge: 2021-11-24 | Disposition: A | Discharge status: Home / Self Care | Payer: 59 | Source: Ambulatory Visit | Attending: Internal Medicine | Admitting: Internal Medicine

## 2021-11-24 ENCOUNTER — Other Ambulatory Visit (HOSPITAL_COMMUNITY): Payer: Self-pay | Admitting: Internal Medicine

## 2021-11-24 DIAGNOSIS — M79605 Pain in left leg: Secondary | ICD-10-CM | POA: Insufficient documentation

## 2021-11-24 NOTE — Telephone Encounter (Signed)
Patient advised Karen [name] she doesn't want to start [name]

## 2021-11-29 ENCOUNTER — Other Ambulatory Visit (HOSPITAL_COMMUNITY): Payer: Self-pay

## 2021-12-01 ENCOUNTER — Other Ambulatory Visit (HOSPITAL_COMMUNITY): Payer: Self-pay

## 2021-12-03 ENCOUNTER — Encounter: Payer: Self-pay | Admitting: Family

## 2021-12-03 ENCOUNTER — Inpatient Hospital Stay (HOSPITAL_BASED_OUTPATIENT_CLINIC_OR_DEPARTMENT_OTHER): Payer: 59 | Admitting: Family

## 2021-12-03 ENCOUNTER — Inpatient Hospital Stay: Payer: 59 | Attending: Hematology & Oncology

## 2021-12-03 LAB — CBC WITH DIFFERENTIAL (CANCER CENTER ONLY)
Abs Immature Granulocytes: 0.01 10*3/uL (ref 0.00–0.07)
Basophils Absolute: 0 10*3/uL (ref 0.0–0.1)
Basophils Relative: 0 %
Eosinophils Absolute: 0.2 10*3/uL (ref 0.0–0.5)
Eosinophils Relative: 3 %
HCT: 37.2 % (ref 36.0–46.0)
Hemoglobin: 11.8 g/dL — ABNORMAL LOW (ref 12.0–15.0)
Immature Granulocytes: 0 %
Lymphocytes Relative: 23 %
Lymphs Abs: 1.8 10*3/uL (ref 0.7–4.0)
MCH: 32.4 pg (ref 26.0–34.0)
MCHC: 31.7 g/dL (ref 30.0–36.0)
MCV: 102.2 fL — ABNORMAL HIGH (ref 80.0–100.0)
Monocytes Absolute: 0.9 10*3/uL (ref 0.1–1.0)
Monocytes Relative: 12 %
Neutro Abs: 4.6 10*3/uL (ref 1.7–7.7)
Neutrophils Relative %: 62 %
Platelet Count: 196 10*3/uL (ref 150–400)
RBC: 3.64 MIL/uL — ABNORMAL LOW (ref 3.87–5.11)
RDW: 11.9 % (ref 11.5–15.5)
WBC Count: 7.5 10*3/uL (ref 4.0–10.5)
nRBC: 0 % (ref 0.0–0.2)

## 2021-12-03 LAB — CMP (CANCER CENTER ONLY)
ALT: 16 U/L (ref 0–44)
AST: 14 U/L — ABNORMAL LOW (ref 15–41)
Albumin: 4.2 g/dL (ref 3.5–5.0)
Alkaline Phosphatase: 91 U/L (ref 38–126)
Anion gap: 7 (ref 5–15)
BUN: 11 mg/dL (ref 6–20)
CO2: 29 mmol/L (ref 22–32)
Calcium: 9.6 mg/dL (ref 8.9–10.3)
Chloride: 101 mmol/L (ref 98–111)
Creatinine: 0.85 mg/dL (ref 0.44–1.00)
GFR, Estimated: >60 mL/min (ref >60)
Glucose, Bld: 102 mg/dL — ABNORMAL HIGH (ref 70–99)
Potassium: 3.9 mmol/L (ref 3.5–5.1)
Sodium: 137 mmol/L (ref 135–145)
Total Bilirubin: 0.6 mg/dL (ref 0.3–1.2)
Total Protein: 7.7 g/dL (ref 6.5–8.1)

## 2021-12-03 LAB — FERRITIN: Ferritin: 379 ng/mL — ABNORMAL HIGH (ref 11–307)

## 2021-12-03 NOTE — Progress Notes (Signed)
Hematology and Oncology Follow Up Visit  Karen [name] [id number] 1970/09/10 51 y.o. 12/03/2021   Principle Diagnosis:  Hemochromatosis, heterozygous H63D mutation  Current Therapy:   Observation   Interim History:  Karen [name] is here today for follow-up. She states that she was diagnosed with EOE on EGD in October with [facility]. She is having a lot of sinus congestion and persistent dry cough. They recently started her on Prevacid to see if this helps reduce her symptoms.  She notes SOB at times.  No fever, chills, n/v, dizziness, chest pain, palpitations, abdominal pain or changes in bowel or bladder habits.  She has a rash that comes and goes under her breast.  She states that she was also just told she is now menopausal.  No swelling in her extremities at this time.  She does have tingling that comes and goes in her left thigh to her calf. No falls or syncope reported.  Appetite and hydration are good. Weight is stable at 208 lbs.   ECOG Performance Status: 1 - Symptomatic but completely ambulatory  Medications:  Allergies as of 12/03/2021       Reactions   Molds & Smuts Cough   sneezing        Medication List        Accurate as of December 03, 2021  3:09 PM. If you have any questions, ask your nurse or doctor.          STOP taking these medications    Dexlansoprazole 30 MG capsule DR Stopped by: Lottie Dawson, NP   famotidine 20 MG tablet Commonly known as: PEPCID Stopped by: Lottie Dawson, NP       TAKE these medications    Acetaminophen 500 MG capsule 1 capsule as needed Orally every 6 hrs   albuterol 108 (90 Base) MCG/ACT inhaler Commonly known as: VENTOLIN HFA Inhale 2 puffs into the lungs every 6 (six) hours as needed for wheezing or shortness of breath.   Dupixent 300 MG/2ML prefilled syringe Generic drug: dupilumab Inject 300 mg into the skin every 7 (seven) days.   Elderberry 575 MG/5ML Syrp Take 30 mLs by mouth daily.   lansoprazole 30  MG capsule Commonly known as: PREVACID Take 1 capsule (30 mg total) by mouth 2 (two) times daily.   levocetirizine 5 MG tablet Commonly known as: XYZAL Take 1 tablet (5 mg total) by mouth every evening. What changed:  when to take this reasons to take this   meclizine 12.5 MG tablet Commonly known as: ANTIVERT SMARTSIG:1 Tablet(s) By Mouth Every 12 Hours PRN   polyethylene glycol 17 g packet Commonly known as: MIRALAX / GLYCOLAX Take 17 g by mouth every other day.   Restora RX 60-1.25 MG Caps Generic drug: Lactobacillus Casei-Folic Acid Take 1 capsule by mouth daily between meals   Xiidra 5 % Soln Generic drug: Lifitegrast Place 1 drop into both eyes 2 (two) times daily.        Allergies:  Allergies  Allergen Reactions   Molds & Smuts Cough    sneezing    Past Medical History, Surgical history, Social history, and Family History were reviewed and updated.  Review of Systems: All other 10 point review of systems is negative.   Physical Exam:  vitals were not taken for this visit.   Wt Readings from Last 3 Encounters:  11/03/21 207 lb (93.9 kg)  10/08/21 210 lb 6.4 oz (95.4 kg)  07/28/21 203 lb (92.1 kg)    Ocular: Sclerae  unicteric, pupils equal, round and reactive to light Ear-nose-throat: Oropharynx clear, dentition fair Lymphatic: No cervical or supraclavicular adenopathy Lungs no rales or rhonchi, good excursion bilaterally Heart regular rate and rhythm, no murmur appreciated Abd soft, nontender, positive bowel sounds MSK no focal spinal tenderness, no joint edema Neuro: non-focal, well-oriented, appropriate affect Breasts: Deferred   Lab Results  Component Value Date   WBC 7.5 12/03/2021   HGB 11.8 (L) 12/03/2021   HCT 37.2 12/03/2021   MCV 102.2 (H) 12/03/2021   PLT 196 12/03/2021   Lab Results  Component Value Date   FERRITIN 390 (H) 06/03/2021   IRON 73 06/03/2021   TIBC 300 06/03/2021   UIBC 227 06/03/2021   IRONPCTSAT 24 06/03/2021    Lab Results  Component Value Date   RBC 3.64 (L) 12/03/2021   No results found for: "KPAFRELGTCHN", "LAMBDASER", "KAPLAMBRATIO" No results found for: "IGGSERUM", "IGA", "IGMSERUM" No results found for: "TOTALPROTELP", "ALBUMINELP", "A1GS", "A2GS", "BETS", "BETA2SER", "GAMS", "MSPIKE", "SPEI"   Chemistry      Component Value Date/Time   NA 138 10/14/2021 2035   NA 140 03/21/2019 1158   K 4.0 10/14/2021 2035   CL 104 10/14/2021 2035   CO2 27 10/14/2021 2035   BUN 11 10/14/2021 2035   BUN 9 03/21/2019 1158   CREATININE 0.80 10/14/2021 2035   CREATININE 0.87 06/03/2021 1351      Component Value Date/Time   CALCIUM 9.6 10/14/2021 2035   ALKPHOS 76 10/14/2021 2035   AST 18 10/14/2021 2035   AST 13 (L) 06/03/2021 1351   ALT 17 10/14/2021 2035   ALT 14 06/03/2021 1351   BILITOT 0.7 10/14/2021 2035   BILITOT 0.5 06/03/2021 1351       Impression and Plan: Karen [name] is very pleasant 51 yo African American female with hemochromatosis, heterozygous for the H63D mutation.  Iron studies are pending. We will set her up for phlebotomy if needed.  So far she has remained iron deficient and has not required any intervention.  Follow-up in 6 months.   Lottie Dawson, NP 12/1/20233:09 PM

## 2021-12-05 DIAGNOSIS — B349 Viral infection, unspecified: Secondary | ICD-10-CM | POA: Diagnosis not present

## 2021-12-05 DIAGNOSIS — J018 Other acute sinusitis: Secondary | ICD-10-CM | POA: Diagnosis not present

## 2021-12-06 ENCOUNTER — Encounter: Payer: Self-pay | Admitting: *Deleted

## 2021-12-06 LAB — IRON AND IRON BINDING CAPACITY (CC-WL,HP ONLY)
Iron: 35 ug/dL (ref 28–170)
Saturation Ratios: 12 % (ref 10.4–31.8)
TIBC: 281 ug/dL (ref 250–450)
UIBC: 246 ug/dL (ref 148–442)

## 2021-12-07 ENCOUNTER — Other Ambulatory Visit (HOSPITAL_COMMUNITY): Payer: Self-pay

## 2021-12-07 DIAGNOSIS — J310 Chronic rhinitis: Secondary | ICD-10-CM | POA: Diagnosis not present

## 2021-12-07 DIAGNOSIS — J0101 Acute recurrent maxillary sinusitis: Secondary | ICD-10-CM | POA: Diagnosis not present

## 2021-12-07 MED ORDER — ALBUTEROL SULFATE HFA 108 (90 BASE) MCG/ACT IN AERS
1.0000 | INHALATION_SPRAY | RESPIRATORY_TRACT | 3 refills | Status: DC | PRN
  Filled 2021-12-07: qty 6.7, 33d supply, fill #0

## 2021-12-09 ENCOUNTER — Encounter: Payer: Self-pay | Admitting: Allergy & Immunology

## 2021-12-09 ENCOUNTER — Other Ambulatory Visit: Payer: Self-pay

## 2021-12-09 ENCOUNTER — Ambulatory Visit (INDEPENDENT_AMBULATORY_CARE_PROVIDER_SITE_OTHER): Payer: 59 | Admitting: Allergy & Immunology

## 2021-12-09 ENCOUNTER — Other Ambulatory Visit (HOSPITAL_COMMUNITY): Payer: Self-pay

## 2021-12-09 VITALS — BP 118/80 | HR 94 | Temp 98.0°F | Resp 16 | Wt 209.7 lb

## 2021-12-09 DIAGNOSIS — J3089 Other allergic rhinitis: Secondary | ICD-10-CM

## 2021-12-09 DIAGNOSIS — R0602 Shortness of breath: Secondary | ICD-10-CM

## 2021-12-09 DIAGNOSIS — R682 Dry mouth, unspecified: Secondary | ICD-10-CM

## 2021-12-09 DIAGNOSIS — R7982 Elevated C-reactive protein (CRP): Secondary | ICD-10-CM

## 2021-12-09 MED ORDER — ALBUTEROL SULFATE HFA 108 (90 BASE) MCG/ACT IN AERS
2.0000 | INHALATION_SPRAY | Freq: Four times a day (QID) | RESPIRATORY_TRACT | 2 refills | Status: DC | PRN
  Filled 2021-12-09: qty 6.7, 25d supply, fill #0

## 2021-12-09 MED ORDER — PREDNISONE 10 MG PO TABS
ORAL_TABLET | ORAL | 0 refills | Status: DC
  Filled 2021-12-09: qty 36, 9d supply, fill #0

## 2021-12-09 NOTE — Patient Instructions (Addendum)
1. SOB (shortness of breath) - Lung testing not done. - You can continue with the albuterol 2 puffs every 4-6 hours. - The prednisone is going to help with this as well.   2. Dysphagia - Continue to follow with GI as you are doing. - I will send my note to Dr. Nicanor Alcon. - I would bet that you are going to end up on Dupixent since you are still having symptoms even with the Prevacid twice daily.   3. Sinusitis  - I would start the prednisone and see how things go. - We could enlarge coverage with Biaxin, but hopefully the prednisone will do the trick. - We could do levofloxacin, but I do not want you to have a tendon rupture from this.  - Call us if you are not feeling better. - My work cell is [phone].  4. Return in about 6 months (around 06/10/2022).    Please inform us of any Emergency Department visits, hospitalizations, or changes in symptoms. Call us before going to the ED for breathing or allergy symptoms since we might be able to fit you in for a sick visit. Feel free to contact us anytime with any questions, problems, or concerns.  It was a pleasure to see you today!  Websites that have reliable patient information: 1. American Academy of Asthma, Allergy, and Immunology: www.aaaai.org 2. Food Allergy Research and Education (FARE): foodallergy.org 3. Mothers of Asthmatics: http://www.asthmacommunitynetwork.org 4. American College of Allergy, Asthma, and Immunology: www.acaai.org   COVID-19 Vaccine Information can be found at: ShippingScam.co.uk For questions related to vaccine distribution or appointments, please email [email or web address] or call 4144472417.   We realize that you might be concerned about having an allergic reaction to the COVID19 vaccines. To help with that concern, WE ARE OFFERING THE COVID19 VACCINES IN OUR OFFICE! Ask the front desk for dates!     "Like" [place] on Facebook and Instagram for  our latest updates!      A healthy democracy works best when [employer] participate! Make sure you are registered to vote! If you have moved or changed any of your contact information, you will need to get this updated before voting!  In some cases, you MAY be able to register to vote online: [email or web address]

## 2021-12-09 NOTE — Progress Notes (Signed)
FOLLOW UP  Date of Service/Encounter:  12/09/21   Assessment:   SOB (shortness of breath) - largely resolved  51m RML lung nodule (benign per imaging guidelines)   Diarrhea - with a history of SIBO s/p treatment by Dr. MJacquiline Doeand constipation s/p two [name]   GERD - has been on PPIs and H2 blockers in the [name] (has known Karen [name] and Karen [name])   Heterozygote carrier for hemochromatosis   Karen [name]   Scleroderma? - sending to see Karen [name]   Elevated CRP  Plan/Recommendations:   1. SOB (shortness of breath) - Lung testing not done. - You can continue with the albuterol 2 puffs every 4-6 hours. - The prednisone is going to help with this as well.   2. Dysphagia - Continue to follow with GI as you are doing. - I will send my note to Karen [name] - I would bet that you are going to end up on Karen [name] since you are still having symptoms even with the Karen [name] twice daily.   3. Sinusitis  - I would start the prednisone and see how things go. - We could enlarge coverage with Biaxin, but hopefully the prednisone will do the trick. - We could do levofloxacin, but I do not want you to have a tendon rupture from this.  - Call [place] you are not feeling better. - My work cell is [phone]  4. Return in about 6 months (around 06/10/2022).    Subjective:   Karen DICKISONis a 51y.o. female presenting today for follow up of  Chief Complaint  Patient presents with   Follow-up   Cough    For about a week. Given antibiotics and cough medication at the Urgent Care    Karen [name] a history of the following: Patient Active Problem List   Diagnosis Date Noted   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   Elevated LFTs 04/04/2021   Colitis 04/04/2021   Bacterial overgrowth syndrome 05/21/2020   Obesity 05/21/2020   History of endometriosis 05/21/2020   Constipation due to outlet dysfunction 02/05/2020   Gastroesophageal reflux disease  02/05/2020   Obstructive sleep apnea treated with continuous positive airway pressure (Karen [name]) 06/16/2017   Perennial allergic rhinitis with a nonallergic component 01/31/2017   Asthmatic bronchitis 01/31/2017   GI symptoms 01/31/2017   Family history of colon cancer 01/27/2015   Chest pain 12/13/2012   Dyspnea 12/13/2012   DVT of lower extremity (deep venous thrombosis) [place]1990    History obtained from: chart review and patient.  Karen [name] a 51y.o. female presenting for a follow up visit. She was last seen in July 2023. At that time, we did a televisit for some problems she was having. We referred her to see Dr. AGavin Poundfor evaluation of her elevated CRP. She has always had a number of symptoms that have been difficult to figure out, including GI symptoms as well as shortness of breath.   In the interim, she has has not done well.   She developed a sinus infection over the weekend. She was started on Karen [name]. She has had 5 days of Karen [name] without improvement. She is not feeling any better at all. She has coughing fits and she is having to sleep on multiple pillows at night. She did NOT COVID testing done at all. She has not had a sinus infections since 2022 early on. She had generally done well with her sinus disease.   She did  go to see Karen [name] on Tuesday. She was already on Karen [name] and he felt that she was fairly inflamed. She has not filled out the prednisone. She did get a taper of that but she has not started it. She has started some Karen [name] early on.   Her husband had PNA as well. Her  daughter had some sinus issues and COVID testing was negative.   Asthma/Respiratory Symptom History: She has been using her inhaler a bit for this episode. This has been helping.   We are still unsure of her asthma diagnosis. SOB is not her main complaint, so we have not determined whether to pursue this more aggressively.   GERD Symptom History: She did have the endoscopy and she  ended up being diagnosed with the Karen [name]. She did go back to see GI. They got the approval for the Karen [name] and this is on hold for now. She is on Karen [name] BID for 8 weeks and then they are going to re-scope her on December 21st.  He continues to follow with Dr. Nicanor [name]. She is still coughing and gagging and choking on stuff, even with the Karen [name].   She is seeing Karen [name] every 6 months for the hemochromatosis. She does not know where she is losing blood from. She did a colonoscopy last year and that was fine. She has always teetered on anemic.   She went to see Karen [name]. All she did came back normal. CRP and ANA were elevated. More testing was done and lupus and Sjogrens was ruled out. This does not mean that it won't show up later per Karen [name]. She is still a conundrum,   Otherwise, there have been no changes to her [name] medical history, surgical history, family history, or social history.    Review of Systems  Constitutional: Negative.  Negative for chills, fever, malaise/fatigue and weight loss.  HENT:  Positive for congestion and sinus pain. Negative for ear discharge and ear pain.        Positive for postnasal drip.   Eyes:  Negative for pain, discharge and redness.  Respiratory:  Positive for cough and shortness of breath. Negative for sputum production and wheezing.   Cardiovascular: Negative.  Negative for chest pain and palpitations.  Gastrointestinal:  Negative for abdominal pain, constipation, diarrhea, heartburn, nausea and vomiting.  Skin: Negative.  Negative for itching and rash.  Neurological:  Negative for dizziness and headaches.  Endo/Heme/Allergies:  Negative for environmental allergies. Does not bruise/bleed easily.  All other systems reviewed and are negative.      Objective:   Blood pressure 118/80, pulse 94, temperature 98 F (36.7 C), temperature source Temporal, resp. rate 16, weight 209 lb 11.2 oz (95.1 kg), SpO2 98 %. Body mass index is 32.36  kg/m.    Physical Exam Vitals reviewed.  Constitutional:      Appearance: Normal appearance. She is well-developed.  HENT:     Head: Normocephalic and atraumatic.     Right Ear: Tympanic membrane, ear canal and external ear normal. No drainage, swelling or tenderness. Tympanic membrane is not injected, scarred, erythematous, retracted or bulging.     Left Ear: Tympanic membrane, ear canal and external ear normal. No drainage, swelling or tenderness. Tympanic membrane is not injected, scarred, erythematous, retracted or bulging.     Nose: No nasal deformity, septal deviation, mucosal edema or rhinorrhea.     Right Turbinates: Enlarged, swollen and pale.     Left Turbinates: Enlarged, swollen and pale.  Right Sinus: No maxillary sinus tenderness or frontal sinus tenderness.     Left Sinus: No maxillary sinus tenderness or frontal sinus tenderness.     Mouth/Throat:     Lips: Pink.     Mouth: Mucous membranes are moist. Mucous membranes are not pale and not dry.     Pharynx: Uvula midline.     Comments: Cobblestoning minimally present.  Eyes:     General:        Right eye: No discharge.        Left eye: No discharge.     Conjunctiva/sclera: Conjunctivae normal.     Right eye: Right conjunctiva is not injected. No chemosis.    Left eye: Left conjunctiva is not injected. No chemosis.    Pupils: Pupils are equal, round, and reactive to light.  Cardiovascular:     Rate and Rhythm: Normal rate and regular rhythm.     Heart sounds: Normal heart sounds.  Pulmonary:     Effort: Pulmonary effort is normal. No tachypnea, accessory muscle usage or respiratory distress.     Breath sounds: Normal breath sounds. No wheezing, rhonchi or rales.     Comments: Moving air well in all lung fields. No increased work of breathing noted.  Chest:     Chest wall: No tenderness.  Abdominal:     Tenderness: There is no abdominal tenderness. There is no guarding or rebound.  Lymphadenopathy:     Head:      Right side of head: No submandibular, tonsillar or occipital adenopathy.     Left side of head: No submandibular, tonsillar or occipital adenopathy.     Cervical: No cervical adenopathy.  Skin:    General: Skin is warm.     Capillary Refill: Capillary refill takes less than 2 seconds.     Coloration: Skin is not pale.     Findings: No abrasion, erythema, petechiae or rash. Rash is not papular, urticarial or vesicular.     Comments: No eczematous or urticarial lesions noted.   Neurological:     Mental Status: She is alert.  Psychiatric:        Behavior: Behavior is cooperative.      Diagnostic studies: none       Salvatore Marvel, MD  [facility] [place] [facility] [place]

## 2021-12-10 ENCOUNTER — Other Ambulatory Visit: Payer: Self-pay | Admitting: *Deleted

## 2021-12-10 DIAGNOSIS — M79605 Pain in left leg: Secondary | ICD-10-CM

## 2021-12-13 DIAGNOSIS — L723 Sebaceous cyst: Secondary | ICD-10-CM | POA: Diagnosis not present

## 2021-12-14 ENCOUNTER — Encounter: Payer: Self-pay | Admitting: Allergy & Immunology

## 2021-12-20 ENCOUNTER — Ambulatory Visit (INDEPENDENT_AMBULATORY_CARE_PROVIDER_SITE_OTHER): Payer: 59 | Admitting: Physician Assistant

## 2021-12-20 ENCOUNTER — Ambulatory Visit (HOSPITAL_COMMUNITY)
Admission: RE | Admit: 2021-12-20 | Discharge: 2021-12-20 | Disposition: A | Discharge status: Home / Self Care | Payer: 59 | Source: Ambulatory Visit | Attending: Surgery | Admitting: Surgery

## 2021-12-20 VITALS — BP 112/72 | HR 82 | Temp 98.0°F | Resp 16 | Ht 67.5 in | Wt 210.0 lb

## 2021-12-20 DIAGNOSIS — M79605 Pain in left leg: Secondary | ICD-10-CM

## 2021-12-20 DIAGNOSIS — I872 Venous insufficiency (chronic) (peripheral): Secondary | ICD-10-CM

## 2021-12-20 NOTE — Progress Notes (Signed)
Office Note     CC:  follow up Requesting Provider:  Leeroy Cha,*  HPI: Karen [name] is a 51 y.o. (1970-06-03) female who presents for evaluation of left lower extremity edema and discomfort.  Patient states over the past 2 to 3 months she is noticed tenderness over her left calf in addition to edema, heaviness, fatigue.  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  She has been wearing 20 to 30 mmHg knee-high compression over the past several weeks to months however has not noticed a change in her symptoms.  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  She denies any history of venous ulcerations, trauma, or prior vascular interventions.  She also has spider veins of both thighs and left ankle however these are not bothersome to her.  She denies tobacco use.   Past Medical History:  Diagnosis Date   Adenomyosis    Anemia    Arthritis 2013   L knee gets steroid injections    Asthmatic bronchitis 01/31/2017   Back pain    Chronic constipation    Diarrhea    DVT of lower extremity (deep venous thrombosis) (HCC)    Dysmenorrhea    Endometriosis    Esophageal stricture    GERD (gastroesophageal reflux disease)    Hiatal hernia    History of uterine fibroid    IBS (irritable bowel syndrome)    Interstitial cystitis    Kidney stones    Lactose intolerance    Migraine    with aura   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   Recurrent upper respiratory infection (URI)    Sebaceous cyst of breast, right lower inner quadrant 10/25/2012   Excised 11/21/12    Sleep apnea    Syncope, non cardiac     Past Surgical History:  Procedure Laterality Date   ARTHROSCOPIC HAGLUNDS REPAIR     BREAST CYST EXCISION Right 11/21/2012   Procedure: CYST EXCISION BREAST;  Surgeon: Haywood Lasso, MD;  Location: [facility];  Service: General;  Laterality: Right;   BREAST  EXCISIONAL BIOPSY Right 11/2012   CESAREAN SECTION  04/19/1993   CHOLECYSTECTOMY     COLONOSCOPY  05/02/2011   Procedure: COLONOSCOPY;  Surgeon: Winfield Cunas., MD;  Location: [facility];  Service: Endoscopy;  Laterality: N/A;   colonoscopy  11/04/2014   DENTAL SURGERY  03/06/2018   2 surgeries ( 03/06/2018 and 04/06/2018)   to remove two separate benign tumors   ESOPHAGEAL MANOMETRY N/A 11/04/2015   Procedure: ESOPHAGEAL MANOMETRY (EM);  Surgeon: Laurence Spates, MD;  Location: [facility];  Service: Endoscopy;  Laterality: N/A;   ESOPHAGOGASTRODUODENOSCOPY  05/02/2011   Procedure: ESOPHAGOGASTRODUODENOSCOPY (EGD);  Surgeon: Winfield Cunas., MD;  Location: [facility];  Service: Endoscopy;  Laterality: N/A;   ESOPHAGOGASTRODUODENOSCOPY N/A 09/01/2014   Procedure: ESOPHAGOGASTRODUODENOSCOPY (EGD);  Surgeon: Laurence Spates, MD;  Location: [facility];  Service: Endoscopy;  Laterality: N/A;   KNEE SURGERY     LAPAROSCOPY     x 4   PH IMPEDANCE STUDY N/A 11/04/2015   Procedure: [place] IMPEDANCE STUDY;  Surgeon: Laurence Spates, MD;  Location: [facility];  Service: Endoscopy;  Laterality: N/A;   VAGINAL HYSTERECTOMY  2010   TVH--ovaries remain    Social History   Socioeconomic History   Marital status: Married    Spouse name: Jeneen Rinks   Number of children: 2  Years of education: Not on file   Highest education level: Not on file  Occupational History   Occupation: Nurse Case Manager  Tobacco Use   Smoking status: Never   Smokeless tobacco: Never  Vaping Use   Vaping Use: Never used  Substance and Sexual Activity   Alcohol use: Not Currently   Drug use: No   Sexual activity: Yes    Partners: Male    Birth control/protection: None, Surgical    Comment: TVH-ovaries remain  Other Topics Concern   Not on file  Social History Narrative   Are you right handed or left handed? Right Handed    Are you currently employed ? 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 No   Who lives with you? Husband and daughters   What type of home do you live in: 1 story or 2 story? Lives in a two story home        Social Determinants of Health   Financial Resource Strain: Not on file  Food Insecurity: No Food Insecurity (04/30/2020)   Hunger Vital Sign    Worried About Running Out of Food in the Last Year: Never true    Ran Out of Food in the Last Year: Never true  Transportation Needs: Not on file  Physical Activity: Not on file  Stress: Not on file  Social Connections: Not on file  Intimate Partner Violence: Not on file    Family History  Problem Relation Age of Onset   Colon cancer Father 52   Hypertension Father    Stroke Father    Heart disease Father    Heart attack Father    Hyperlipidemia Father    Colon cancer Mother 72   Diabetes Maternal Uncle    Stroke Paternal Grandmother    Heart attack Paternal Grandmother    Heart attack Maternal Grandmother    Heart attack Maternal Grandfather    Cancer Maternal Uncle    Hypertension Sister    Diabetes Brother    Breast cancer Neg Hx     Current Outpatient Medications  Medication Sig Dispense Refill   Acetaminophen 500 MG capsule 1 capsule as needed Orally every 6 hrs     albuterol (VENTOLIN HFA) 108 (90 Base) MCG/ACT inhaler Inhale 2 puffs into the lungs every 6 (six) hours as needed for wheezing or shortness of breath. 6.7 g 2   amoxicillin-clavulanate (AUGMENTIN) 875-125 MG tablet Take 1 tablet by mouth 2 (two) times daily. For 10 days. Started on Sunday 12/05/21     Elderberry 575 MG/5ML SYRP Take 30 mLs by mouth daily.     Lactobacillus Casei-Folic Acid (RESTORA RX) 60-1.25 MG CAPS Take 1 capsule by mouth daily between meals 30 capsule 5   lansoprazole (PREVACID) 30 MG capsule Take 1 capsule (30 mg total) by mouth 2 (two) times daily. 180 capsule 0   levocetirizine (XYZAL) 5 MG tablet Take 1 tablet (5 mg total) by mouth every evening. (Patient  taking differently: Take 5 mg by mouth daily as needed for allergies.) 30 tablet 5   Lifitegrast (XIIDRA) 5 % SOLN Place 1 drop into both eyes 2 (two) times daily. 60 each PRN   meclizine (ANTIVERT) 12.5 MG tablet      polyethylene glycol (MIRALAX / GLYCOLAX) 17 g packet Take 17 g by mouth every other day.     predniSONE (DELTASONE) 10 MG tablet Take 3 tabs ('30mg'$ )  twice daily for 3 days, then 2 tabs ('20mg'$ ) twice daily for 3 days, then 1 tab ('10mg'$ ) twice daily for 3 days, then STOP. 36 tablet 0   dupilumab (DUPIXENT) 300 MG/2ML prefilled syringe Inject 300 mg into the skin every 7 (seven) days. (Patient not taking: Reported on 12/03/2021) 8 mL 11   No current facility-administered medications for this visit.    Allergies  Allergen Reactions   Molds & Smuts Cough    sneezing     REVIEW OF SYSTEMS:   '[X]'$  denotes positive finding, '[ ]'$  denotes negative finding Cardiac  Comments:  Chest pain or chest pressure:    Shortness of breath upon exertion:    Short of breath when lying flat:    Irregular heart rhythm:        Vascular    Pain in calf, thigh, or hip brought on by ambulation:    Pain in feet at night that wakes you up from your sleep:     Blood clot in your veins:    Leg swelling:         Pulmonary    Oxygen at home:    Productive cough:     Wheezing:         Neurologic    Sudden weakness in arms or legs:     Sudden numbness in arms or legs:     Sudden onset of difficulty speaking or slurred speech:    Temporary loss of vision in one eye:     Problems with dizziness:         Gastrointestinal    Blood in stool:     Vomited blood:         Genitourinary    Burning when urinating:     Blood in urine:        Psychiatric    Major depression:         Hematologic    Bleeding problems:    Problems with blood clotting too easily:        Skin    Rashes or ulcers:        Constitutional    Fever or chills:      PHYSICAL EXAMINATION:  Vitals:   12/20/21 1123  BP:  112/72  Pulse: 82  Resp: 16  Temp: 98 F (36.7 C)  TempSrc: Temporal  SpO2: 98%  Weight: 210 lb (95.3 kg)  Height: 5' 7.5" (1.715 m)    General:  WDWN in NAD; vital signs documented above Gait: Not observed HENT: WNL, normocephalic Pulmonary: normal non-labored breathing , without Rales, rhonchi,  wheezing Cardiac: regular HR Abdomen: soft, NT, no masses Skin: without rashes Vascular Exam/Pulses:  Right Left  DP 2+ (normal) 2+ (normal)   Extremities: without ischemic changes, without Gangrene , without cellulitis; without open wounds; without significant pitting edema; spider veins of the both anterior distal thighs and left ankle; no pigmentation changes or cracking skin Musculoskeletal: no muscle wasting or atrophy  Neurologic: A&O X 3;  No focal weakness or paresthesias are detected Psychiatric:  The pt has Normal affect.   Non-Invasive Vascular Imaging:   Left lower extremity venous reflux study negative for DVT Negative for deep reflux Incompetent small saphenous vein that is greater than 4 mm in diameter throughout its path in the calf    ASSESSMENT/PLAN:: 51 y.o. female here for evaluation of left lower extremity edema and venous symptoms over the past several months  -Subjectively the patient experiences tenderness over her left calf in  addition to an increase in edema, heaviness, fatigue in her left leg.  She has a history of a DVT involving the saphenous popliteal junction -Left lower extremity venous reflux study negative for DVT.  Also negative for deep venous reflux.  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  She will continue to try to elevate her legs periodically throughout the day.  She will also try to avoid prolonged sitting and standing.  NSAIDs can be used when discomfort is at its peak during the day.  She will return after 3 months of the above recommendations to see if she  will be a candidate for small saphenous vein ablation therapy    Dagoberto Ligas, PA-C [facility] [phone]  Clinic MD:   Virl Cagey on call

## 2021-12-23 ENCOUNTER — Other Ambulatory Visit (HOSPITAL_COMMUNITY): Payer: Self-pay

## 2021-12-23 DIAGNOSIS — G4733 Obstructive sleep apnea (adult) (pediatric): Secondary | ICD-10-CM | POA: Diagnosis not present

## 2021-12-23 DIAGNOSIS — K222 Esophageal obstruction: Secondary | ICD-10-CM | POA: Diagnosis not present

## 2021-12-23 DIAGNOSIS — H5213 Myopia, bilateral: Secondary | ICD-10-CM | POA: Diagnosis not present

## 2021-12-23 DIAGNOSIS — Z79899 Other long term (current) drug therapy: Secondary | ICD-10-CM | POA: Diagnosis not present

## 2021-12-23 DIAGNOSIS — Z86718 Personal history of other venous thrombosis and embolism: Secondary | ICD-10-CM | POA: Diagnosis not present

## 2021-12-23 DIAGNOSIS — J45909 Unspecified asthma, uncomplicated: Secondary | ICD-10-CM | POA: Diagnosis not present

## 2021-12-23 DIAGNOSIS — K2100 Gastro-esophageal reflux disease with esophagitis, without bleeding: Secondary | ICD-10-CM | POA: Diagnosis not present

## 2021-12-23 DIAGNOSIS — K449 Diaphragmatic hernia without obstruction or gangrene: Secondary | ICD-10-CM | POA: Diagnosis not present

## 2021-12-23 MED ORDER — XIIDRA 5 % OP SOLN
1.0000 [drp] | Freq: Two times a day (BID) | OPHTHALMIC | 0 refills | Status: DC
  Filled 2021-12-23: qty 120, 60d supply, fill #0

## 2021-12-24 ENCOUNTER — Other Ambulatory Visit (HOSPITAL_COMMUNITY): Payer: Self-pay

## 2021-12-29 ENCOUNTER — Ambulatory Visit (HOSPITAL_BASED_OUTPATIENT_CLINIC_OR_DEPARTMENT_OTHER)
Admission: RE | Admit: 2021-12-29 | Discharge: 2021-12-29 | Disposition: A | Discharge status: Home / Self Care | Payer: 59 | Source: Ambulatory Visit | Attending: Internal Medicine | Admitting: Internal Medicine

## 2021-12-29 ENCOUNTER — Ambulatory Visit: Payer: Self-pay | Admitting: Surgery

## 2021-12-29 ENCOUNTER — Other Ambulatory Visit (HOSPITAL_COMMUNITY): Payer: Self-pay

## 2021-12-29 ENCOUNTER — Other Ambulatory Visit (HOSPITAL_BASED_OUTPATIENT_CLINIC_OR_DEPARTMENT_OTHER): Payer: Self-pay | Admitting: Internal Medicine

## 2021-12-29 DIAGNOSIS — R1032 Left lower quadrant pain: Secondary | ICD-10-CM | POA: Diagnosis not present

## 2021-12-29 DIAGNOSIS — R10824 Left lower quadrant rebound abdominal tenderness: Secondary | ICD-10-CM | POA: Insufficient documentation

## 2021-12-29 MED ORDER — CIPROFLOXACIN HCL 500 MG PO TABS
500.0000 mg | ORAL_TABLET | Freq: Two times a day (BID) | ORAL | 0 refills | Status: DC
  Filled 2021-12-29 (×3): qty 14, 7d supply, fill #0

## 2021-12-29 MED ORDER — METRONIDAZOLE 500 MG PO TABS
500.0000 mg | ORAL_TABLET | Freq: Three times a day (TID) | ORAL | 0 refills | Status: AC
  Filled 2021-12-29: qty 21, 7d supply, fill #0

## 2021-12-29 MED ORDER — IOHEXOL 300 MG/ML  SOLN
80.0000 mL | Freq: Once | INTRAMUSCULAR | Status: AC | PRN
  Administered 2021-12-29: 80 mL via INTRAVENOUS

## 2021-12-29 MED ORDER — FLUCONAZOLE 150 MG PO TABS
150.0000 mg | ORAL_TABLET | Freq: Every day | ORAL | 0 refills | Status: DC
  Filled 2021-12-29: qty 2, 2d supply, fill #0

## 2021-12-30 DIAGNOSIS — K5900 Constipation, unspecified: Secondary | ICD-10-CM | POA: Diagnosis not present

## 2022-01-05 ENCOUNTER — Ambulatory Visit: Payer: 59 | Admitting: Neurology

## 2022-01-07 DIAGNOSIS — N200 Calculus of kidney: Secondary | ICD-10-CM | POA: Diagnosis not present

## 2022-01-12 ENCOUNTER — Encounter (HOSPITAL_BASED_OUTPATIENT_CLINIC_OR_DEPARTMENT_OTHER): Payer: Self-pay | Admitting: Surgery

## 2022-01-12 ENCOUNTER — Other Ambulatory Visit: Payer: Self-pay

## 2022-01-14 DIAGNOSIS — J441 Chronic obstructive pulmonary disease with (acute) exacerbation: Secondary | ICD-10-CM | POA: Diagnosis not present

## 2022-01-14 DIAGNOSIS — Z86718 Personal history of other venous thrombosis and embolism: Secondary | ICD-10-CM | POA: Diagnosis not present

## 2022-01-14 DIAGNOSIS — G4733 Obstructive sleep apnea (adult) (pediatric): Secondary | ICD-10-CM | POA: Diagnosis not present

## 2022-01-14 DIAGNOSIS — K5900 Constipation, unspecified: Secondary | ICD-10-CM | POA: Diagnosis not present

## 2022-01-17 NOTE — Progress Notes (Signed)

## 2022-01-18 ENCOUNTER — Encounter (HOSPITAL_BASED_OUTPATIENT_CLINIC_OR_DEPARTMENT_OTHER): Payer: Self-pay | Admitting: Surgery

## 2022-01-18 ENCOUNTER — Ambulatory Visit (HOSPITAL_BASED_OUTPATIENT_CLINIC_OR_DEPARTMENT_OTHER)
Admission: RE | Admit: 2022-01-18 | Discharge: 2022-01-18 | Disposition: A | Discharge status: Home / Self Care | Payer: Commercial Managed Care - PPO | Source: Ambulatory Visit | Attending: Surgery | Admitting: Surgery

## 2022-01-18 ENCOUNTER — Other Ambulatory Visit: Payer: Self-pay

## 2022-01-18 ENCOUNTER — Ambulatory Visit (HOSPITAL_BASED_OUTPATIENT_CLINIC_OR_DEPARTMENT_OTHER): Payer: Commercial Managed Care - PPO | Admitting: Anesthesiology

## 2022-01-18 ENCOUNTER — Encounter (HOSPITAL_BASED_OUTPATIENT_CLINIC_OR_DEPARTMENT_OTHER)
Admission: RE | Disposition: A | Discharge status: Home / Self Care | Payer: Self-pay | Source: Ambulatory Visit | Attending: Surgery

## 2022-01-18 ENCOUNTER — Other Ambulatory Visit (HOSPITAL_COMMUNITY): Payer: Self-pay

## 2022-01-18 DIAGNOSIS — D235 Other benign neoplasm of skin of trunk: Secondary | ICD-10-CM | POA: Diagnosis not present

## 2022-01-18 DIAGNOSIS — L821 Other seborrheic keratosis: Secondary | ICD-10-CM | POA: Diagnosis not present

## 2022-01-18 DIAGNOSIS — Z01818 Encounter for other preprocedural examination: Secondary | ICD-10-CM

## 2022-01-18 DIAGNOSIS — L723 Sebaceous cyst: Secondary | ICD-10-CM | POA: Diagnosis not present

## 2022-01-18 DIAGNOSIS — N6001 Solitary cyst of right breast: Secondary | ICD-10-CM | POA: Insufficient documentation

## 2022-01-18 DIAGNOSIS — L720 Epidermal cyst: Secondary | ICD-10-CM | POA: Diagnosis not present

## 2022-01-18 DIAGNOSIS — N6002 Solitary cyst of left breast: Secondary | ICD-10-CM | POA: Diagnosis not present

## 2022-01-18 DIAGNOSIS — D234 Other benign neoplasm of skin of scalp and neck: Secondary | ICD-10-CM | POA: Insufficient documentation

## 2022-01-18 HISTORY — DX: Other specified postprocedural states: Z98.890

## 2022-01-18 HISTORY — PX: BREAST CYST EXCISION: SHX579

## 2022-01-18 HISTORY — PX: LESION REMOVAL: SHX5196

## 2022-01-18 HISTORY — DX: Personal history of urinary calculi: Z87.442

## 2022-01-18 SURGERY — EXCISION, CYST, BREAST
Anesthesia: General | Site: Chest

## 2022-01-18 MED ORDER — AMISULPRIDE (ANTIEMETIC) 5 MG/2ML IV SOLN: 10.0000 mg | Freq: Once | INTRAVENOUS | Status: DC | PRN

## 2022-01-18 MED ORDER — CEFAZOLIN SODIUM-DEXTROSE 2-4 GM/100ML-% IV SOLN
INTRAVENOUS | Status: AC
  Filled 2022-01-18: qty 100

## 2022-01-18 MED ORDER — FENTANYL CITRATE (PF) 100 MCG/2ML IJ SOLN
INTRAMUSCULAR | Status: AC
  Filled 2022-01-18: qty 2

## 2022-01-18 MED ORDER — FENTANYL CITRATE (PF) 100 MCG/2ML IJ SOLN
INTRAMUSCULAR | Status: DC | PRN
  Administered 2022-01-18: 50 ug via INTRAVENOUS

## 2022-01-18 MED ORDER — CHLORHEXIDINE GLUCONATE CLOTH 2 % EX PADS: 6.0000 | MEDICATED_PAD | Freq: Once | CUTANEOUS | Status: DC

## 2022-01-18 MED ORDER — DEXAMETHASONE SODIUM PHOSPHATE 10 MG/ML IJ SOLN
INTRAMUSCULAR | Status: DC | PRN
  Administered 2022-01-18: 10 mg via INTRAVENOUS

## 2022-01-18 MED ORDER — KETOROLAC TROMETHAMINE 30 MG/ML IJ SOLN: 30.0000 mg | Freq: Once | INTRAMUSCULAR | Status: DC

## 2022-01-18 MED ORDER — OXYCODONE HCL 5 MG/5ML PO SOLN: 5.0000 mg | Freq: Once | ORAL | Status: AC | PRN

## 2022-01-18 MED ORDER — SCOPOLAMINE 1 MG/3DAYS TD PT72
MEDICATED_PATCH | TRANSDERMAL | Status: DC | PRN
  Administered 2022-01-18: 1 via TRANSDERMAL

## 2022-01-18 MED ORDER — PROPOFOL 10 MG/ML IV BOLUS
INTRAVENOUS | Status: DC | PRN
  Administered 2022-01-18: 200 mg via INTRAVENOUS

## 2022-01-18 MED ORDER — CEFAZOLIN SODIUM-DEXTROSE 2-4 GM/100ML-% IV SOLN
2.0000 g | INTRAVENOUS | Status: AC
  Administered 2022-01-18: 2 g via INTRAVENOUS

## 2022-01-18 MED ORDER — PROMETHAZINE HCL 25 MG/ML IJ SOLN: 6.2500 mg | INTRAMUSCULAR | Status: DC | PRN

## 2022-01-18 MED ORDER — OXYCODONE HCL 5 MG PO TABS
ORAL_TABLET | ORAL | Status: AC
  Filled 2022-01-18: qty 1

## 2022-01-18 MED ORDER — MIDAZOLAM HCL 2 MG/2ML IJ SOLN
INTRAMUSCULAR | Status: DC | PRN
  Administered 2022-01-18: 2 mg via INTRAVENOUS

## 2022-01-18 MED ORDER — DEXAMETHASONE SODIUM PHOSPHATE 10 MG/ML IJ SOLN
INTRAMUSCULAR | Status: AC
  Filled 2022-01-18: qty 2

## 2022-01-18 MED ORDER — MIDAZOLAM HCL 2 MG/2ML IJ SOLN
INTRAMUSCULAR | Status: AC
  Filled 2022-01-18: qty 2

## 2022-01-18 MED ORDER — ACETAMINOPHEN 10 MG/ML IV SOLN: 1000.0000 mg | Freq: Once | INTRAVENOUS | Status: DC | PRN

## 2022-01-18 MED ORDER — BUPIVACAINE-EPINEPHRINE (PF) 0.5% -1:200000 IJ SOLN
INTRAMUSCULAR | Status: AC
  Filled 2022-01-18: qty 30

## 2022-01-18 MED ORDER — OXYCODONE HCL 5 MG PO TABS
5.0000 mg | ORAL_TABLET | Freq: Four times a day (QID) | ORAL | 0 refills | Status: DC | PRN
  Filled 2022-01-18: qty 15, 4d supply, fill #0

## 2022-01-18 MED ORDER — ONDANSETRON HCL 4 MG/2ML IJ SOLN
INTRAMUSCULAR | Status: AC
  Filled 2022-01-18: qty 2

## 2022-01-18 MED ORDER — FENTANYL CITRATE (PF) 100 MCG/2ML IJ SOLN
25.0000 ug | INTRAMUSCULAR | Status: DC | PRN
  Administered 2022-01-18: 50 ug via INTRAVENOUS

## 2022-01-18 MED ORDER — LACTATED RINGERS IV SOLN: INTRAVENOUS | Status: DC

## 2022-01-18 MED ORDER — ONDANSETRON HCL 4 MG/2ML IJ SOLN
INTRAMUSCULAR | Status: DC | PRN
  Administered 2022-01-18: 4 mg via INTRAVENOUS

## 2022-01-18 MED ORDER — BUPIVACAINE-EPINEPHRINE 0.25% -1:200000 IJ SOLN
INTRAMUSCULAR | Status: DC | PRN
  Administered 2022-01-18: 20 mL

## 2022-01-18 MED ORDER — OXYCODONE HCL 5 MG PO TABS
5.0000 mg | ORAL_TABLET | Freq: Once | ORAL | Status: AC | PRN
  Administered 2022-01-18: 5 mg via ORAL

## 2022-01-18 SURGICAL SUPPLY — 46 items
ADH SKN CLS APL DERMABOND .7 (GAUZE/BANDAGES/DRESSINGS) ×2
APL PRP STRL LF DISP 70% ISPRP (MISCELLANEOUS) ×2
APPLIER CLIP 9.375 MED OPEN (MISCELLANEOUS)
APR CLP MED 9.3 20 MLT OPN (MISCELLANEOUS)
BINDER BREAST LRG (GAUZE/BANDAGES/DRESSINGS) IMPLANT
BINDER BREAST MEDIUM (GAUZE/BANDAGES/DRESSINGS) IMPLANT
BINDER BREAST XLRG (GAUZE/BANDAGES/DRESSINGS) IMPLANT
BINDER BREAST XXLRG (GAUZE/BANDAGES/DRESSINGS) IMPLANT
BLADE SURG 15 STRL LF DISP TIS (BLADE) ×2 IMPLANT
BLADE SURG 15 STRL SS (BLADE) ×2
CANISTER SUCT 1200ML W/VALVE (MISCELLANEOUS) ×2 IMPLANT
CHLORAPREP W/TINT 26 (MISCELLANEOUS) ×2 IMPLANT
CLIP APPLIE 9.375 MED OPEN (MISCELLANEOUS) IMPLANT
COVER BACK TABLE 60X90IN (DRAPES) ×2 IMPLANT
COVER MAYO STAND STRL (DRAPES) ×2 IMPLANT
DERMABOND ADVANCED .7 DNX12 (GAUZE/BANDAGES/DRESSINGS) ×2 IMPLANT
DRAPE LAPAROSCOPIC ABDOMINAL (DRAPES) IMPLANT
DRAPE LAPAROTOMY 100X72 PEDS (DRAPES) ×2 IMPLANT
DRAPE UTILITY XL STRL (DRAPES) ×2 IMPLANT
ELECT COATED BLADE 2.86 ST (ELECTRODE) ×2 IMPLANT
ELECT REM PT RETURN 9FT ADLT (ELECTROSURGICAL) ×2
ELECTRODE REM PT RTRN 9FT ADLT (ELECTROSURGICAL) ×2 IMPLANT
GLOVE BIOGEL PI IND STRL 8 (GLOVE) ×2 IMPLANT
GLOVE ECLIPSE 8.0 STRL XLNG CF (GLOVE) ×2 IMPLANT
GOWN STRL REUS W/ TWL LRG LVL3 (GOWN DISPOSABLE) ×4 IMPLANT
GOWN STRL REUS W/ TWL XL LVL3 (GOWN DISPOSABLE) ×2 IMPLANT
GOWN STRL REUS W/TWL LRG LVL3 (GOWN DISPOSABLE) ×4
GOWN STRL REUS W/TWL XL LVL3 (GOWN DISPOSABLE) ×2
KIT MARKER MARGIN INK (KITS) IMPLANT
NDL HYPO 25X1 1.5 SAFETY (NEEDLE) ×2 IMPLANT
NEEDLE HYPO 25X1 1.5 SAFETY (NEEDLE) ×2 IMPLANT
NS IRRIG 1000ML POUR BTL (IV SOLUTION) ×2 IMPLANT
PACK BASIN DAY SURGERY FS (CUSTOM PROCEDURE TRAY) ×2 IMPLANT
PENCIL SMOKE EVACUATOR (MISCELLANEOUS) ×2 IMPLANT
SLEEVE SCD COMPRESS KNEE MED (STOCKING) ×2 IMPLANT
SPIKE FLUID TRANSFER (MISCELLANEOUS) ×2 IMPLANT
SPONGE T-LAP 4X18 ~~LOC~~+RFID (SPONGE) ×2 IMPLANT
STAPLER VISISTAT 35W (STAPLE) IMPLANT
SUT MON AB 4-0 PC3 18 (SUTURE) ×2 IMPLANT
SUT SILK 2 0 SH (SUTURE) IMPLANT
SUT VICRYL 3-0 CR8 SH (SUTURE) ×2 IMPLANT
SYR CONTROL 10ML LL (SYRINGE) ×2 IMPLANT
TOWEL GREEN STERILE FF (TOWEL DISPOSABLE) ×4 IMPLANT
TRAY FAXITRON CT DISP (TRAY / TRAY PROCEDURE) IMPLANT
TUBE CONNECTING 20X1/4 (TUBING) ×2 IMPLANT
YANKAUER SUCT BULB TIP NO VENT (SUCTIONS) ×2 IMPLANT

## 2022-01-18 NOTE — Anesthesia Preprocedure Evaluation (Addendum)
Anesthesia Evaluation  Patient identified by MRN, date of birth, ID band Patient awake    Reviewed: Allergy & Precautions, NPO status , Patient's Chart, lab work & pertinent test results  History of Anesthesia Complications (+) PONV and history of anesthetic complications  Airway Mallampati: II  TM Distance: >3 FB Neck ROM: Full    Dental no notable dental hx.    Pulmonary asthma , sleep apnea    Pulmonary exam normal        Cardiovascular + DVT  Normal cardiovascular exam     Neuro/Psych  Headaches    GI/Hepatic Neg liver ROS,GERD  Medicated and Controlled,,  Endo/Other  negative endocrine ROS    Renal/GU negative Renal ROS     Musculoskeletal  (+) Arthritis ,    Abdominal  (+) + obese  Peds  Hematology negative hematology ROS (+)   Anesthesia Other Findings SEBACEOUS CYSTS  Reproductive/Obstetrics                             Anesthesia Physical Anesthesia Plan  ASA: 2  Anesthesia Plan: General   Post-op Pain Management:    Induction: Intravenous  PONV Risk Score and Plan: 4 or greater and Ondansetron, Dexamethasone, Propofol infusion, Midazolam and Treatment may vary due to age or medical condition  Airway Management Planned: LMA  Additional Equipment:   Intra-op Plan:   Post-operative Plan: Extubation in OR  Informed Consent: I have reviewed the patients History and Physical, chart, labs and discussed the procedure including the risks, benefits and alternatives for the proposed anesthesia with the patient or authorized representative who has indicated his/her understanding and acceptance.     Dental advisory given  Plan Discussed with: CRNA  Anesthesia Plan Comments:         Anesthesia Quick Evaluation

## 2022-01-18 NOTE — Anesthesia Procedure Notes (Signed)
Procedure Name: LMA Insertion Date/Time: 01/18/2022 10:25 AM  Performed by: [place] Manus Rudd, CRNAPre-anesthesia Checklist: Patient identified, Emergency Drugs available, Suction available and Patient being monitored Patient Re-evaluated:Patient Re-evaluated prior to induction Oxygen Delivery Method: Circle system utilized Preoxygenation: Pre-oxygenation with 100% oxygen Induction Type: IV induction Ventilation: Mask ventilation without difficulty LMA: LMA inserted LMA Size: 4.0 Number of attempts: 1 Airway Equipment and Method: Bite block Placement Confirmation: positive ETCO2 Tube secured with: Tape Dental Injury: Teeth and Oropharynx as per pre-operative assessment

## 2022-01-18 NOTE — Interval H&P Note (Signed)
History and Physical Interval Note:  01/18/2022 9:45 AM  Karen [name]  has presented today for surgery, with the diagnosis of SEBACEOUS CYSTS.  The various methods of treatment have been discussed with the patient and family. After consideration of risks, benefits and other options for treatment, the patient has consented to  Procedure(s): EXCISION OF SEBACEOUS CYST BILATERAL BREAST (Bilateral) EXCISION OF SEBACEOUS CYSTS CHEST AND NECK (N/A) as a surgical intervention.  The patient's history has been reviewed, patient examined, no change in status, stable for surgery.  I have reviewed the patient's chart and labs.  Questions were answered to the patient's satisfaction.     [place]

## 2022-01-18 NOTE — Transfer of Care (Signed)
Immediate Anesthesia Transfer of Care Note  Patient: Serena Colonel  Procedure(s) Performed: EXCISION OF SEBACEOUS CYST BILATERAL BREAST (Bilateral: Breast) EXCISION OF SEBACEOUS CYSTS CHEST AND NECK (Chest)  Patient Location: PACU  Anesthesia Type:General  Level of Consciousness: awake and drowsy  Airway & Oxygen Therapy: Patient Spontanous Breathing and Patient connected to nasal cannula oxygen  Post-op Assessment: Report given to RN and Post -op Vital signs reviewed and stable  Post vital signs: Reviewed and stable  Last Vitals:  Vitals Value Taken Time  BP 114/68 01/18/22 1115  Temp    Pulse 185 01/18/22 1123  Resp 14 01/18/22 1123  SpO2 100 % 01/18/22 1123  Vitals shown include unvalidated device data.  Last Pain:  Vitals:   01/18/22 0913  TempSrc: Oral  PainSc: 0-No pain         Complications: No notable events documented.

## 2022-01-18 NOTE — Op Note (Signed)
Preoperative diagnosis: Sebaceous cyst to neck, right upper breast, right medial breast, left upper breast, left lateral breast, left lower breast, and epigastrium each measuring 1 cm in maximal diameter  Postoperative diagnosis: Same  Excision of sebaceous cyst to right breast, left breast, neck and upper abdomen  Surgeon: Erroll Luna, MD  Anesthesia: LMA with 0.5% Marcaine with epinephrine  EBL: Minimal  Specimens as above  Drains: None  Indications for procedure: The patient is a 52 year old female with multiple sebaceous cyst she wishes to have removed.  Risk and benefits of surgery discussed as well as observation.  We discussed the above she wished to proceed.The procedure has been discussed with the patient.  Alternative therapies have been discussed with the patient.  Operative risks include bleeding,  Infection,  Organ injury,  Nerve injury,  Blood vessel injury,  DVT,  Pulmonary embolism,  Death,  And possible reoperation.  Medical management risks include worsening of present situation.  The success of the procedure is 50 -90 % at treating patients symptoms.  The patient understands and agrees to proceed.     Description of procedure: The patient was met in the holding area and questions were answered.  The cyst was marked with a marking pen with the assistance of the patient and these were all reviewed with the patient.  I she was then taken back to the operative room.  She was placed supine upon the OR table.  After induction of general esthesia the area was prepped draped in sterile fashion and a timeout performed.  Proper patient, site and procedure verified all marks were visible during the procedure.  Multiple small stab incisions were made over each cyst.  The entire cyst was removed in its entirety from all locations listed above.  Hemostasis was achieved.  Each wound was closed with 3-0 Vicryl.  Dermabond was applied.  All counts were found to be correct.  The patient was  then awoke extubated taken recovery in satisfactory condition.

## 2022-01-18 NOTE — Discharge Instructions (Addendum)
#######################################################  GENERAL SURGERY: POST OP INSTRUCTIONS  ######################################################################  EAT Gradually transition to a high fiber diet with a fiber supplement over the next few weeks after discharge.  Start with a pureed / full liquid diet (see below)  WALK Walk an hour a day.  Control your pain to do that.    CONTROL PAIN Control pain so that you can walk, sleep, tolerate sneezing/coughing, go up/down stairs.  HAVE A BOWEL MOVEMENT DAILY Keep your bowels regular to avoid problems.  OK to try a laxative to override constipation.  OK to use an antidairrheal to slow down diarrhea.  Call if not better after 2 tries  CALL IF YOU HAVE PROBLEMS/CONCERNS Call if you are still struggling despite following these instructions. Call if you have concerns not answered by these instructions  ######################################################################    DIET: Follow a light bland diet & liquids the first 24 hours after arrival home, such as soup, liquids, starches, etc.  Be sure to drink plenty of fluids.  Quickly advance to a usual solid diet within a few days.  Avoid fast food or heavy meals as your are more likely to get nauseated or have irregular bowels.  A low-fat, high-fiber diet for the rest of your life is ideal.    Take your usually prescribed home medications unless otherwise directed.  PAIN CONTROL: Pain is best controlled by a usual combination of three different methods TOGETHER: Ice/Heat Over the counter pain medication Prescription pain medication Most patients will experience some swelling and bruising around the incisions.  Ice packs or heating pads (30-60 minutes up to 6 times a day) will help. Use ice for the first few days to help decrease swelling and bruising, then switch to heat to help relax tight/sore spots and speed recovery.  Some people prefer to use ice alone, heat alone,  alternating between ice & heat.  Experiment to what works for you.  Swelling and bruising can take several weeks to resolve.   It is helpful to take an over-the-counter pain medication regularly for the first few weeks.  Choose one of the following that works best for you: Naproxen (Aleve, etc)  Two 220mg  tabs twice a day Ibuprofen (Advil, etc) Three 200mg  tabs four times a day (every meal & bedtime) Acetaminophen (Tylenol, etc) 500-650mg  four times a day (every meal & bedtime) A  prescription for pain medication (such as oxycodone, hydrocodone, etc) should be given to you upon discharge.  Take your pain medication as prescribed.  If you are having problems/concerns with the prescription medicine (does not control pain, nausea, vomiting, rash, itching, etc), please call us [phone] to see if we need to switch you to a different pain medicine that will work better for you and/or control your side effect better. If you need a refill on your pain medication, please contact your pharmacy.  They will contact our office to request authorization. Prescriptions will not be filled after 5 pm or on week-ends.  Avoid getting constipated.  Between the surgery and the pain medications, it is common to experience some constipation.  Increasing fluid intake and taking a fiber supplement (such as Metamucil, Citrucel, FiberCon, MiraLax, etc) 1-2 times a day regularly will usually help prevent this problem from occurring.  A mild laxative (prune juice, Milk of Magnesia, MiraLax, etc) should be taken according to package directions if there are no bowel movements after 48 hours.   Watch out for diarrhea.  If you have many loose bowel movements, simplify your  diet to bland foods & liquids for a few days.  Stop any stool softeners and decrease your fiber supplement.  Switching to mild anti-diarrheal medications (Loperamide/Imodium, Kayopectate, Pepto Bismol) can help.  If this worsens or does not improve, please call  us.  Wash / shower every day.  You may shower over the dressings as they are waterproof.  Continue to shower over incision(s) after the dressing is off. Remove your waterproof bandages 5 days after surgery.  You may leave the incision open to air.  You may have skin tapes (Steri Strips) covering the incision(s).  Leave them on until one week, then remove.  You may replace a dressing/Band-Aid to cover the incision for comfort if you wish.   ACTIVITIES as tolerated:   You may resume regular (light) daily activities beginning the next day--such as daily self-care, walking, climbing stairs--gradually increasing activities as tolerated.  If you can walk 30 minutes without difficulty, it is safe to try more intense activity such as jogging, treadmill, bicycling, low-impact aerobics, swimming, etc. Save the most intensive and strenuous activity for last such as sit-ups, heavy lifting, contact sports, etc  Refrain from any heavy lifting or straining until you are off narcotics for pain control.   DO NOT PUSH THROUGH PAIN.  Let pain be your guide: If it hurts to do something, don't do it.  Pain is your body warning you to avoid that activity for another week until the pain goes down. You may drive when you are no longer taking prescription pain medication, you can comfortably wear a seatbelt, and you can safely maneuver your car and apply brakes. You may have sexual intercourse when it is comfortable.   FOLLOW UP in our office Please call [facility] at [phone] to set up an appointment to see your surgeon in the office for a follow-up appointment approximately 2-3 weeks after your surgery. Make sure that you call for this appointment the day you arrive home to insure a convenient appointment time.  9. IF YOU HAVE DISABILITY OR FAMILY LEAVE FORMS, BRING THEM TO THE OFFICE FOR PROCESSING.  DO NOT GIVE THEM TO YOUR DOCTOR.   WHEN TO CALL us [phone]: Poor pain control Reactions / problems with new  medications (rash/itching, nausea, etc)  Fever over 101.5 F (38.5 C) Worsening swelling or bruising Continued bleeding from incision. Increased pain, redness, or drainage from the incision Difficulty breathing / swallowing   The clinic staff is available to answer your questions during regular business hours (8:30am-5pm).  Please don't hesitate to call and ask to speak to one of our nurses for clinical concerns.   If you have a medical emergency, go to the nearest emergency room or call 911.  A surgeon from [facility] is always on call at the [facility], [place], [place], [place], [place]  [address] ? MAIN: [phone] ? TOLL FREE: [phone] ?  FAX [phone] [email or web address]  #######################################################    Post Anesthesia Home Care Instructions  Activity: Get plenty of rest for the remainder of the day. A responsible individual must stay with you for 24 hours following the procedure.  For the next 24 hours, DO NOT: -Drive a car -Paediatric nurse -Drink alcoholic beverages -Take any medication unless instructed by your physician -Make any legal decisions or sign important papers.  Meals: Start with liquid foods such as gelatin or soup. Progress to regular foods as tolerated. Avoid greasy, spicy, heavy foods.  If nausea and/or vomiting occur, drink only clear liquids until the nausea and/or vomiting subsides. Call your physician if vomiting continues.  Special Instructions/Symptoms: Your throat may feel dry or sore from the anesthesia or the breathing tube placed in your throat during surgery. If this causes discomfort, gargle with warm salt water. The discomfort should disappear within 24 hours.  If you had a scopolamine patch placed behind your ear for the management of post- operative nausea and/or vomiting:  1. The medication in the patch is effective for 72 hours,  after which it should be removed.  Wrap patch in a tissue and discard in the trash. Wash hands thoroughly with soap and water. 2. You may remove the patch earlier than 72 hours if you experience unpleasant side effects which may include dry mouth, dizziness or visual disturbances. 3. Avoid touching the patch. Wash your hands with soap and water after contact with the patch.

## 2022-01-18 NOTE — H&P (Signed)
Karen [name] is a 52 y.o. female who is seen today as an office consultation for evaluation of Breast Problem .  Patient presents for evaluation of bilateral breast masses. She was noted to have 2 sebaceous cysts in both breast located at the superior aspect of both. She has a history of sebaceous cyst. Imaging did not reveal anything atypical. She also has 1 in her neck. There is also to the medial right breast that were not imaged. She is here today to discuss management of the cyst. Is been no redness or drainage. She has a history of an infected sebaceous cyst 20 years ago that was drained.  Review of Systems: A complete review of systems was obtained from the patient. I have reviewed this information and discussed as appropriate with the patient. See HPI as well for other ROS.    Medical History: No past medical history on file.  There is no problem list on file for this patient.  No past surgical history on file.  No Known Allergies  No current outpatient medications on file prior to visit.  No current facility-administered medications on file prior to visit.  No family history on file.  Social History  Tobacco Use Smoking Status Not on file Smokeless Tobacco Not on file   Social History  Socioeconomic History Marital status: Married  Objective: There were no vitals filed for this visit. There is no height or weight on file to calculate BMI.  Physical Exam HENT: Head: Normocephalic. Pulmonary: Effort: Pulmonary effort is normal. Breath sounds: No stridor. Chest: Breasts: Right: Normal. Left: Normal.  Comments: Multiple sebaceous cyst noted. Each measures 1 cm. Musculoskeletal: Cervical back: Normal range of motion. Lymphadenopathy: Upper Body: Right upper body: No supraclavicular or axillary adenopathy. Left upper body: No supraclavicular or axillary adenopathy. Skin: General: Skin is warm.   Comments: Multiple 1 cm sebaceous cyst located neck,  superior aspect of both breast and the clavicle in the midline, medial right breast in the midline. Neurological: General: No focal deficit present. Mental Status: She is alert. Psychiatric: Mood and Affect: Mood normal.    Labs, Imaging and Diagnostic Testing:  [facility] REPORT: 11/23/2021 13:31  ADDENDUM: Surgical consultation has been arranged with Dr. Erroll Luna at [facility] on December 13, 2021.  Stacie Acres RN on 11/23/2021   Electronically Signed By: Kristopher Oppenheim M.D. On: 11/23/2021 13:31  Addended by Shayne Alken, MD on 11/23/2021 3:31 PM  Study Result  Narrative & Impression CLINICAL DATA: 53 year old female recently evaluated for a benign, palpable lump in the right breast which the patient feels has enlarged. Additionally, new palpable lump in the left breast.  EXAM: DIGITAL DIAGNOSTIC BILATERAL MAMMOGRAM WITH TOMOSYNTHESIS; ULTRASOUND LEFT BREAST LIMITED; ULTRASOUND RIGHT BREAST LIMITED  TECHNIQUE: Bilateral digital diagnostic mammography and breast tomosynthesis was performed.; Targeted ultrasound examination of the left breast was performed.; Targeted ultrasound examination of the right breast was performed  COMPARISON: Previous exam(s).  ACR Breast Density Category b: There are scattered areas of fibroglandular density.  FINDINGS: Radiopaque BBs were placed at the site of the patient's palpable lumps in the far superior, posterior right breast a and upper left breast. Oval, circumscribed fat density masses are seen deep to both radiopaque BBs. Otherwise, no new or suspicious findings in either breast.  Targeted ultrasound is performed, showing stable appearance of an oval, circumscribed hypoechoic mass at the superficial 12 o'clock position 16 cm from the nipple on the right. It measures 7 x 6 x 3 mm (previously  7 x 5 x 3 mm). A similar appearing mass on the left breast at the 12 o'clock position 20 cm from the nipple  measures 6 x 6 x 5 mm. There is a tract to the overlying skin identified on the left.  IMPRESSION: 1. Benign, fat density right breast palpable lump without significant interval change. 2. Benign left breast sebaceous/inclusion cyst corresponding with the palpable lump.  RECOMMENDATION: 1. The patient would like surgical referral for possible removal of both palpable areas. This will be arranged. 2. Otherwise, routine annual screening in 1 year.  I have discussed the findings and recommendations with the patient. If applicable, a reminder letter will be sent to the patient regarding the next appointment.  BI-RADS CATEGORY 2: Benign.  Electronically Signed: By: Kristopher Oppenheim M.D. On: 11/22/2021 10:34    Assessment and Plan:  Diagnoses and all orders for this visit:  Sebaceous cyst    Multiple sebaceous cyst noted.  Discussed excision if she desires. She will think this over and let us know. Risk of bleeding, infection with recurrence, cosmesis, and the need for the treatment center procedure discussed. Natural history of sebaceous cyst reviewed as well.  No follow-ups on file.  Kennieth Francois, MD

## 2022-01-18 NOTE — Anesthesia Postprocedure Evaluation (Signed)
Anesthesia Post Note  Patient: Karen [name]  Procedure(s) Performed: EXCISION OF SEBACEOUS CYST BILATERAL BREAST (Bilateral: Breast) EXCISION OF SEBACEOUS CYSTS CHEST AND NECK (Chest)     Patient location during evaluation: PACU Anesthesia Type: General Level of consciousness: awake Pain management: pain level controlled Vital Signs Assessment: post-procedure vital signs reviewed and stable Respiratory status: spontaneous breathing, nonlabored ventilation and respiratory function stable Cardiovascular status: blood pressure returned to baseline and stable Postop Assessment: no apparent nausea or vomiting Anesthetic complications: no   No notable events documented.  Last Vitals:  Vitals:   01/18/22 1212 01/18/22 1219  BP:  115/76  Pulse: 71   Resp: 16   Temp: 36.5 C   SpO2: 97%     Last Pain:  Vitals:   01/18/22 1212  TempSrc: Oral  PainSc: 4                  Kayley Zeiders P Tanija Germani

## 2022-01-19 ENCOUNTER — Encounter: Payer: Self-pay | Admitting: Surgery

## 2022-01-19 ENCOUNTER — Encounter (HOSPITAL_BASED_OUTPATIENT_CLINIC_OR_DEPARTMENT_OTHER): Payer: Self-pay | Admitting: Surgery

## 2022-01-19 LAB — SURGICAL PATHOLOGY

## 2022-01-26 ENCOUNTER — Ambulatory Visit (HOSPITAL_BASED_OUTPATIENT_CLINIC_OR_DEPARTMENT_OTHER): Payer: 59 | Admitting: Obstetrics & Gynecology

## 2022-01-28 ENCOUNTER — Ambulatory Visit (HOSPITAL_BASED_OUTPATIENT_CLINIC_OR_DEPARTMENT_OTHER): Payer: 59 | Admitting: Obstetrics & Gynecology

## 2022-02-24 DIAGNOSIS — R197 Diarrhea, unspecified: Secondary | ICD-10-CM | POA: Diagnosis not present

## 2022-02-24 DIAGNOSIS — K5900 Constipation, unspecified: Secondary | ICD-10-CM | POA: Diagnosis not present

## 2022-03-03 ENCOUNTER — Other Ambulatory Visit (HOSPITAL_COMMUNITY): Payer: Self-pay

## 2022-03-03 MED ORDER — ONDANSETRON 4 MG PO TBDP
4.0000 mg | ORAL_TABLET | Freq: Three times a day (TID) | ORAL | 2 refills | Status: AC | PRN
  Filled 2022-03-03: qty 30, 10d supply, fill #0

## 2022-03-04 ENCOUNTER — Encounter (HOSPITAL_BASED_OUTPATIENT_CLINIC_OR_DEPARTMENT_OTHER): Payer: Self-pay | Admitting: Obstetrics & Gynecology

## 2022-03-04 ENCOUNTER — Ambulatory Visit (INDEPENDENT_AMBULATORY_CARE_PROVIDER_SITE_OTHER): Payer: Commercial Managed Care - PPO | Admitting: Obstetrics & Gynecology

## 2022-03-04 ENCOUNTER — Other Ambulatory Visit (HOSPITAL_COMMUNITY): Payer: Self-pay

## 2022-03-04 VITALS — BP 123/73 | HR 66 | Ht 67.5 in | Wt 210.8 lb

## 2022-03-04 DIAGNOSIS — I82492 Acute embolism and thrombosis of other specified deep vein of left lower extremity: Secondary | ICD-10-CM

## 2022-03-04 DIAGNOSIS — K5902 Outlet dysfunction constipation: Secondary | ICD-10-CM

## 2022-03-04 DIAGNOSIS — R1010 Upper abdominal pain, unspecified: Secondary | ICD-10-CM

## 2022-03-04 DIAGNOSIS — M6289 Other specified disorders of muscle: Secondary | ICD-10-CM | POA: Diagnosis not present

## 2022-03-04 DIAGNOSIS — Z01419 Encounter for gynecological examination (general) (routine) without abnormal findings: Secondary | ICD-10-CM | POA: Diagnosis not present

## 2022-03-04 MED ORDER — LANSOPRAZOLE 30 MG PO CPDR
30.0000 mg | DELAYED_RELEASE_CAPSULE | Freq: Two times a day (BID) | ORAL | 0 refills | Status: DC
  Filled 2022-03-04: qty 180, 90d supply, fill #0

## 2022-03-04 NOTE — Progress Notes (Signed)
52 y.o. G2P2 Married [place] American female here for annual exam.  Had multiple sebaceous cysts removed by Dr. Brantley Stage.  Done in outpt surgical OR.    Since I saw her last, she had elevated D dimer.  She did have doppler and chest CT.  She did have an abd/pelvic CT ordered by Dr. Vertis Kelch due to pelvic pain.  She is now in a phase of having constipation but had nausea and emesis.  She is on zofran.    No LMP recorded. Patient has had a hysterectomy.          Sexually active: Yes.    The current method of family planning is status post hysterectomy.    Smoker:  no  Health Maintenance: Pap:  not indicated History of abnormal Pap:  no MMG:  11/22/2021 Colonoscopy:  2021 or 2022.  Release will be signed.   Screening Labs: done with PCP   reports that she has never smoked. She has never used smokeless tobacco. She reports that she does not currently use alcohol. She reports that she does not use drugs.  Past Medical History:  Diagnosis Date   Adenomyosis    Anemia    Arthritis 2013   L knee gets steroid injections    Asthmatic bronchitis 01/31/2017   Back pain    Chronic constipation    Diarrhea    DVT of lower extremity (deep venous thrombosis) (HCC)    Dysmenorrhea    Endometriosis    Esophageal stricture    GERD (gastroesophageal reflux disease)    History of kidney stones    History of uterine fibroid    IBS (irritable bowel syndrome)    Interstitial cystitis    Lactose intolerance    Migraine    with aura   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   PONV (postoperative nausea and vomiting)    Recurrent upper respiratory infection (URI)    Sebaceous cyst of breast, right lower inner quadrant 10/25/2012   Excised 11/21/12    Sleep apnea    Syncope, non cardiac     Past Surgical History:  Procedure Laterality Date   ARTHROSCOPIC HAGLUNDS REPAIR     BREAST CYST EXCISION Right 11/21/2012   Procedure: CYST EXCISION BREAST;  Surgeon: Haywood Lasso, MD;   Location: [place];  Service: General;  Laterality: Right;   BREAST CYST EXCISION Bilateral 01/18/2022   Procedure: EXCISION OF SEBACEOUS CYST BILATERAL BREAST;  Surgeon: Erroll Luna, MD;  Location: [place];  Service: General;  Laterality: Bilateral;   BREAST EXCISIONAL BIOPSY Right 11/2012   CESAREAN SECTION  04/19/1993   CHOLECYSTECTOMY     COLONOSCOPY  05/02/2011   Procedure: COLONOSCOPY;  Surgeon: Winfield Cunas., MD;  Location: [facility];  Service: Endoscopy;  Laterality: N/A;   colonoscopy  11/04/2014   DENTAL SURGERY  03/06/2018   2 surgeries ( 03/06/2018 and 04/06/2018)   to remove two separate benign tumors   ESOPHAGEAL MANOMETRY N/A 11/04/2015   Procedure: ESOPHAGEAL MANOMETRY (EM);  Surgeon: Laurence Spates, MD;  Location: [facility];  Service: Endoscopy;  Laterality: N/A;   ESOPHAGOGASTRODUODENOSCOPY  05/02/2011   Procedure: ESOPHAGOGASTRODUODENOSCOPY (EGD);  Surgeon: Winfield Cunas., MD;  Location: [facility];  Service: Endoscopy;  Laterality: N/A;   ESOPHAGOGASTRODUODENOSCOPY N/A 09/01/2014   Procedure: ESOPHAGOGASTRODUODENOSCOPY (EGD);  Surgeon: Laurence Spates, MD;  Location: [facility];  Service: Endoscopy;  Laterality: N/A;   KNEE SURGERY     LAPAROSCOPY     x  4   LESION REMOVAL N/A 01/18/2022   Procedure: EXCISION OF SEBACEOUS CYSTS CHEST AND NECK;  Surgeon: Erroll Luna, MD;  Location: [place];  Service: General;  Laterality: N/A;   [place] IMPEDANCE STUDY N/A 11/04/2015   Procedure: [place] IMPEDANCE STUDY;  Surgeon: Laurence Spates, MD;  Location: [facility];  Service: Endoscopy;  Laterality: N/A;   VAGINAL HYSTERECTOMY  2010   TVH--ovaries remain    Current Outpatient Medications  Medication Sig Dispense Refill   Acetaminophen 500 MG capsule 1 capsule as needed Orally every 6 hrs     albuterol (VENTOLIN HFA) 108 (90 Base) MCG/ACT inhaler Inhale 2 puffs into the lungs every 6 (six) hours as needed for  wheezing or shortness of breath. 6.7 g 2   Elderberry 575 MG/5ML SYRP Take 30 mLs by mouth daily.     Lactobacillus Casei-Folic Acid (RESTORA RX) 60-1.25 MG CAPS Take 1 capsule by mouth daily between meals 30 capsule 5   lansoprazole (PREVACID) 30 MG capsule Take 1 capsule (30 mg total) by mouth 2 (two) times daily. 180 capsule 0   levocetirizine (XYZAL) 5 MG tablet Take 1 tablet (5 mg total) by mouth every evening. (Patient taking differently: Take 5 mg by mouth daily as needed for allergies.) 30 tablet 5   Lifitegrast (XIIDRA) 5 % SOLN Place 1 drop into both eyes 2 (two) times daily. 120 each 0   polyethylene glycol (MIRALAX / GLYCOLAX) 17 g packet Take 17 g by mouth every other day.     ondansetron (ZOFRAN-ODT) 4 MG disintegrating tablet Dissolve 1 tablet (4 mg total) in mouth every 8 (eight) hours as needed for nausea (Patient not taking: Reported on 03/04/2022) 30 tablet 2   No current facility-administered medications for this visit.    Family History  Problem Relation Age of Onset   Colon cancer Father 108   Hypertension Father    Stroke Father    Heart disease Father    Heart attack Father    Hyperlipidemia Father    Colon cancer Mother 51   Diabetes Maternal Uncle    Stroke Paternal Grandmother    Heart attack Paternal Grandmother    Heart attack Maternal Grandmother    Heart attack Maternal Grandfather    Cancer Maternal Uncle    Hypertension Sister    Diabetes Brother    Breast cancer Neg Hx     ROS: Constitutional: negative Genitourinary:negative  Exam:   BP 123/73 (BP Location: Left Arm, Patient Position: Sitting, Cuff Size: Large)   Pulse 66   Ht 5' 7.5" (1.715 m) Comment: Reported  Wt 210 lb 12.8 oz (95.6 kg)   BMI 32.53 kg/m   Height: 5' 7.5" (171.5 cm) (Reported)  General appearance: alert, cooperative and appears stated age Head: Normocephalic, without obvious abnormality, atraumatic Neck: no adenopathy, supple, symmetrical, trachea midline and thyroid  normal to inspection and palpation Lungs: clear to auscultation bilaterally Breasts: normal appearance, no masses or tenderness Heart: regular rate and rhythm Abdomen: soft, non-tender; bowel sounds normal; no masses,  no organomegaly Extremities: extremities normal, atraumatic, no cyanosis or edema Skin: Skin color, texture, turgor normal. No rashes or lesions Lymph nodes: Cervical, supraclavicular, and axillary nodes normal. No abnormal inguinal nodes palpated Neurologic: Grossly normal   Pelvic: External genitalia:  no lesions              Urethra:  normal appearing urethra with no masses, tenderness or lesions  Bartholins and Skenes: normal                 Vagina: normal appearing vagina with normal color and no discharge, no lesions, pelvic floor tenderness              Cervix: absent              Pap taken: No. Bimanual Exam:  Uterus:  uterus absent              Adnexa: no mass, fullness, tenderness               Rectovaginal: Confirms               Anus:  normal sphincter tone, no lesions  Chaperone, Octaviano Batty, CMA, was present for exam.  Assessment/Plan: 1. Well woman exam with routine gynecological exam - Pap smear not indicated - Mammogram 11/2021 - Colonoscopy release will signed - lab work done with PCP, Dr. Mertha Finders - vaccines reviewed/updated  2. Deep vein thrombosis (DVT) of other vein of left lower extremity, unspecified chronicity (HCC) - h/o  3. Pain of upper abdomen - followed at [facility]  4.  Hysterectomy, vaginal in 2010  5. Constipation due to outlet dysfunction  6.  Pelvic floor dysfunction

## 2022-03-06 DIAGNOSIS — K5901 Slow transit constipation: Secondary | ICD-10-CM | POA: Diagnosis not present

## 2022-03-06 DIAGNOSIS — K5900 Constipation, unspecified: Secondary | ICD-10-CM | POA: Diagnosis not present

## 2022-03-16 ENCOUNTER — Telehealth (HOSPITAL_BASED_OUTPATIENT_CLINIC_OR_DEPARTMENT_OTHER): Payer: Self-pay | Admitting: Obstetrics & Gynecology

## 2022-03-16 NOTE — Telephone Encounter (Signed)
Patient called and would like for  the nurse to please call her about the referral for pelvic floor therapy.

## 2022-03-17 ENCOUNTER — Other Ambulatory Visit (HOSPITAL_BASED_OUTPATIENT_CLINIC_OR_DEPARTMENT_OTHER): Payer: Self-pay | Admitting: *Deleted

## 2022-03-17 DIAGNOSIS — M6289 Other specified disorders of muscle: Secondary | ICD-10-CM

## 2022-03-17 NOTE — Progress Notes (Signed)
Pt requests referral to [facility] for pelvic floor PT. She has worked with this provider before. Order placed.

## 2022-03-18 ENCOUNTER — Other Ambulatory Visit: Payer: Self-pay

## 2022-03-18 ENCOUNTER — Telehealth: Payer: Self-pay

## 2022-03-18 NOTE — Telephone Encounter (Signed)
Patient called to state she has been feeling exhausted and tired and has noticed some crampy abdominal pain. Pt stated last time she felt this way her labs were abnormal. Discussed patient symptoms with Lottie Dawson NP and lab orders received. Pt educated that at this time we will schedule her for labs and then proceed from there. Pt aware that scheduling will call to schedule the lab appointment. Pt verbalized understanding and had no further questions.

## 2022-03-18 NOTE — Telephone Encounter (Signed)
Pt called requesting referral to [place] [facility] for pelvic floor PT. Referral placed.

## 2022-03-21 ENCOUNTER — Telehealth: Payer: Self-pay | Admitting: *Deleted

## 2022-03-21 ENCOUNTER — Ambulatory Visit: Payer: Commercial Managed Care - PPO | Admitting: Surgery

## 2022-03-21 NOTE — Telephone Encounter (Signed)
Per scheduling message - Jarrett Soho (lab only) called patient and she will call back after she looks at her work schedule

## 2022-03-22 ENCOUNTER — Inpatient Hospital Stay: Payer: Commercial Managed Care - PPO | Attending: Hematology & Oncology

## 2022-03-22 LAB — CBC WITH DIFFERENTIAL (CANCER CENTER ONLY)
Abs Immature Granulocytes: 0.01 10*3/uL (ref 0.00–0.07)
Basophils Absolute: 0 10*3/uL (ref 0.0–0.1)
Basophils Relative: 1 %
Eosinophils Absolute: 0.2 10*3/uL (ref 0.0–0.5)
Eosinophils Relative: 4 %
HCT: 39 % (ref 36.0–46.0)
Hemoglobin: 12.8 g/dL (ref 12.0–15.0)
Immature Granulocytes: 0 %
Lymphocytes Relative: 37 %
Lymphs Abs: 1.6 10*3/uL (ref 0.7–4.0)
MCH: 32.5 pg (ref 26.0–34.0)
MCHC: 32.8 g/dL (ref 30.0–36.0)
MCV: 99 fL (ref 80.0–100.0)
Monocytes Absolute: 0.4 10*3/uL (ref 0.1–1.0)
Monocytes Relative: 8 %
Neutro Abs: 2.2 10*3/uL (ref 1.7–7.7)
Neutrophils Relative %: 50 %
Platelet Count: 198 10*3/uL (ref 150–400)
RBC: 3.94 MIL/uL (ref 3.87–5.11)
RDW: 11.8 % (ref 11.5–15.5)
WBC Count: 4.3 10*3/uL (ref 4.0–10.5)
nRBC: 0 % (ref 0.0–0.2)

## 2022-03-22 LAB — IRON AND IRON BINDING CAPACITY (CC-WL,HP ONLY)
Iron: 83 ug/dL (ref 28–170)
Saturation Ratios: 28 % (ref 10.4–31.8)
TIBC: 295 ug/dL (ref 250–450)
UIBC: 212 ug/dL (ref 148–442)

## 2022-03-22 LAB — CMP (CANCER CENTER ONLY)
ALT: 36 U/L (ref 0–44)
AST: 25 U/L (ref 15–41)
Albumin: 4.5 g/dL (ref 3.5–5.0)
Alkaline Phosphatase: 101 U/L (ref 38–126)
Anion gap: 7 (ref 5–15)
BUN: 15 mg/dL (ref 6–20)
CO2: 30 mmol/L (ref 22–32)
Calcium: 10 mg/dL (ref 8.9–10.3)
Chloride: 101 mmol/L (ref 98–111)
Creatinine: 0.72 mg/dL (ref 0.44–1.00)
GFR, Estimated: >60 mL/min (ref >60)
Glucose, Bld: 99 mg/dL (ref 70–99)
Potassium: 4.3 mmol/L (ref 3.5–5.1)
Sodium: 138 mmol/L (ref 135–145)
Total Bilirubin: 0.8 mg/dL (ref 0.3–1.2)
Total Protein: 7.7 g/dL (ref 6.5–8.1)

## 2022-03-22 LAB — FERRITIN: Ferritin: 296 ng/mL (ref 11–307)

## 2022-03-28 DIAGNOSIS — I8393 Asymptomatic varicose veins of bilateral lower extremities: Secondary | ICD-10-CM

## 2022-04-11 ENCOUNTER — Ambulatory Visit: Payer: Commercial Managed Care - PPO | Admitting: Surgery

## 2022-04-11 ENCOUNTER — Encounter: Payer: Self-pay | Admitting: Surgery

## 2022-04-11 VITALS — BP 111/76 | HR 78 | Temp 98.2°F | Resp 20 | Ht 67.5 in | Wt 207.9 lb

## 2022-04-11 DIAGNOSIS — I872 Venous insufficiency (chronic) (peripheral): Secondary | ICD-10-CM | POA: Diagnosis not present

## 2022-04-11 NOTE — Progress Notes (Signed)
[facility] [place]  Patient name: Karen [name] MRN: [id number] DOB: 1970-10-01 Sex: female   REASON FOR VISIT:    Follow up  HISOTRY OF PRESENT ILLNESS:    Karen [name] is a 52 y.o. female who is here for follow-up of her lower extremity swelling.  She states that this has gotten worse over the past several months.  She was seen by the PA and started with 20-30 compression socks which have helped some.  She still gets some stinging in her legs.  She does have a history of a DVT at age 80.  She is not on anticoagulation.  She is heterozygous for hemochromatosis.   PAST MEDICAL HISTORY:   Past Medical History:  Diagnosis Date   Adenomyosis    Anemia    Arthritis 2013   L knee gets steroid injections    Asthmatic bronchitis 01/31/2017   Back pain    Chronic constipation    Diarrhea    DVT of lower extremity (deep venous thrombosis)    Dysmenorrhea    Endometriosis    Esophageal stricture    GERD (gastroesophageal reflux disease)    History of kidney stones    History of uterine fibroid    IBS (irritable bowel syndrome)    Interstitial cystitis    Lactose intolerance    Migraine    with aura   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   PONV (postoperative nausea and vomiting)    Recurrent upper respiratory infection (URI)    Sebaceous cyst of breast, right lower inner quadrant 10/25/2012   Excised 11/21/12    Sleep apnea    Syncope, non cardiac      FAMILY HISTORY:   Family History  Problem Relation Age of Onset   Colon cancer Father 56   Hypertension Father    Stroke Father    Heart disease Father    Heart attack Father    Hyperlipidemia Father    Colon cancer Mother 67   Diabetes Maternal Uncle    Stroke Paternal Grandmother    Heart attack Paternal Grandmother    Heart attack Maternal Grandmother    Heart attack Maternal Grandfather    Cancer Maternal Uncle    Hypertension Sister    Diabetes  Brother    Breast cancer Neg Hx     SOCIAL HISTORY:   Social History   Tobacco Use   Smoking status: Never   Smokeless tobacco: Never  Substance Use Topics   Alcohol use: Not Currently     ALLERGIES:   Allergies  Allergen Reactions   Molds & Smuts Cough    sneezing     CURRENT MEDICATIONS:   Current Outpatient Medications  Medication Sig Dispense Refill   Acetaminophen 500 MG capsule 1 capsule as needed Orally every 6 hrs     albuterol (VENTOLIN HFA) 108 (90 Base) MCG/ACT inhaler Inhale 2 puffs into the lungs every 6 (six) hours as needed for wheezing or shortness of breath. 6.7 g 2   Elderberry 575 MG/5ML SYRP Take 30 mLs by mouth daily.     Lactobacillus Casei-Folic Acid (RESTORA RX) 60-1.25 MG CAPS Take 1 capsule by mouth daily between meals 30 capsule 5   lansoprazole (PREVACID) 30 MG capsule Take 1 capsule (30 mg total) by mouth 2 (two) times daily. 180 capsule 0   levocetirizine (XYZAL) 5 MG tablet Take 1 tablet (5 mg total) by mouth every evening. (Patient taking differently: Take 5 mg by mouth daily as  needed for allergies.) 30 tablet 5   Lifitegrast (XIIDRA) 5 % SOLN Place 1 drop into both eyes 2 (two) times daily. 120 each 0   ondansetron (ZOFRAN-ODT) 4 MG disintegrating tablet Dissolve 1 tablet (4 mg total) in mouth every 8 (eight) hours as needed for nausea 30 tablet 2   polyethylene glycol (MIRALAX / GLYCOLAX) 17 g packet Take 17 g by mouth every other day.     No current facility-administered medications for this visit.    REVIEW OF SYSTEMS:   [X]  denotes positive finding, [ ]  denotes negative finding Cardiac  Comments:  Chest pain or chest pressure:    Shortness of breath upon exertion:    Short of breath when lying flat:    Irregular heart rhythm:        Vascular    Pain in calf, thigh, or hip brought on by ambulation:    Pain in feet at night that wakes you up from your sleep:     Blood clot in your veins:    Leg swelling:  x       Pulmonary     Oxygen at home:    Productive cough:     Wheezing:         Neurologic    Sudden weakness in arms or legs:     Sudden numbness in arms or legs:     Sudden onset of difficulty speaking or slurred speech:    Temporary loss of vision in one eye:     Problems with dizziness:         Gastrointestinal    Blood in stool:     Vomited blood:         Genitourinary    Burning when urinating:     Blood in urine:        Psychiatric    Major depression:         Hematologic    Bleeding problems:    Problems with blood clotting too easily:        Skin    Rashes or ulcers:        Constitutional    Fever or chills:      PHYSICAL EXAM:   Vitals:   04/11/22 1424  BP: 111/76  Pulse: 78  Resp: 20  Temp: 98.2 F (36.8 C)  SpO2: 95%  Weight: 207 lb 14.4 oz (94.3 kg)  Height: 5' 7.5" (1.715 m)    GENERAL: The patient is a well-nourished female, in no acute distress. The vital signs are documented above. CARDIAC: There is a regular rate and rhythm.  VASCULAR: I used the sono site to evaluate her left small saphenous vein which was not very dilated today.  There are multiple spider veins on the lower legs. PULMONARY: Non-labored respirationss.  MUSCULOSKELETAL: There are no major deformities or cyanosis. NEUROLOGIC: No focal weakness or paresthesias are detected. SKIN: See photo below PSYCHIATRIC: The patient has a normal affect.  STUDIES:   I have reviewed the following:  Venous Reflux Times  +-----+---------+------+-----------+------------+--------+  RIGHTReflux NoRefluxReflux TimeDiameter cmsComments                 Yes                                   +-----+---------+------+-----------+------------+--------+  0.39              +-----+---------+------+-----------+------------+--------+     +--------------+---------+------+-----------+------------+--------+  LEFT         Reflux NoRefluxReflux TimeDiameter  cmsComments                          Yes                                   +--------------+---------+------+-----------+------------+--------+  CFV          no                                              +--------------+---------+------+-----------+------------+--------+  FV mid        no                                              +--------------+---------+------+-----------+------------+--------+  Popliteal    no                                              +--------------+---------+------+-----------+------------+--------+  GSV at [facility]    no                            0.69              +--------------+---------+------+-----------+------------+--------+  GSV prox thighno                            0.51              +--------------+---------+------+-----------+------------+--------+  GSV mid thigh no                            0.42              +--------------+---------+------+-----------+------------+--------+  GSV dist thighno                            0.44              +--------------+---------+------+-----------+------------+--------+  GSV at knee   no                            0.40              +--------------+---------+------+-----------+------------+--------+  GSV prox calf no                            0.20              +--------------+---------+------+-----------+------------+--------+  GSV mid calf  no                            0.22              +--------------+---------+------+-----------+------------+--------+  SSV Pop Fossa  yes    >500 ms      0.60              +--------------+---------+------+-----------+------------+--------+  SSV prox calf           yes    >500 ms      0.53              +--------------+---------+------+-----------+------------+--------+  SSV mid calf            yes    >500 ms      0.41               +--------------+---------+------+-----------+------------+--------+  AASV O        no                            0.40              +--------------+---------+------+-----------+------------+--------+  AASV P        no                            0.31              +--------------+---------+------+-----------+------------+--------+  AASV M        no                            0.39              +--------------+---------+------+-----------+------------+--------+   MEDICAL ISSUES:   The patient's only reflux is in the small saphenous vein on the left.  When I looked at this with the SonoSite, the vein was relatively unimpressive.  I did not feel that she would get significant benefit from laser ablation.  Therefore, I have recommended continued leg elevation and compression socks.  I am also going to refer her to the lymphedema clinic.  I will also have Harriett SineNancy reach out to her for possible sclerotherapy.    Charlena CrossWells Lary Eckardt, IV, MD, FACS [facility] Tel [phone] Pager [phone]

## 2022-04-15 DIAGNOSIS — R350 Frequency of micturition: Secondary | ICD-10-CM | POA: Diagnosis not present

## 2022-04-15 DIAGNOSIS — N200 Calculus of kidney: Secondary | ICD-10-CM | POA: Diagnosis not present

## 2022-04-15 DIAGNOSIS — K581 Irritable bowel syndrome with constipation: Secondary | ICD-10-CM | POA: Diagnosis not present

## 2022-05-04 ENCOUNTER — Encounter (HOSPITAL_BASED_OUTPATIENT_CLINIC_OR_DEPARTMENT_OTHER): Payer: Self-pay | Admitting: *Deleted

## 2022-05-05 ENCOUNTER — Other Ambulatory Visit (HOSPITAL_COMMUNITY): Payer: Self-pay

## 2022-05-05 DIAGNOSIS — K5900 Constipation, unspecified: Secondary | ICD-10-CM | POA: Diagnosis not present

## 2022-05-05 DIAGNOSIS — K638219 Small intestinal bacterial overgrowth, unspecified: Secondary | ICD-10-CM | POA: Diagnosis not present

## 2022-05-05 DIAGNOSIS — Z800 Family history of malignant neoplasm of digestive organs: Secondary | ICD-10-CM | POA: Diagnosis not present

## 2022-05-05 DIAGNOSIS — K200 Eosinophilic esophagitis: Secondary | ICD-10-CM | POA: Diagnosis not present

## 2022-05-05 DIAGNOSIS — R1319 Other dysphagia: Secondary | ICD-10-CM | POA: Diagnosis not present

## 2022-05-05 DIAGNOSIS — R1310 Dysphagia, unspecified: Secondary | ICD-10-CM | POA: Diagnosis not present

## 2022-05-05 DIAGNOSIS — Z9049 Acquired absence of other specified parts of digestive tract: Secondary | ICD-10-CM | POA: Diagnosis not present

## 2022-05-05 DIAGNOSIS — K449 Diaphragmatic hernia without obstruction or gangrene: Secondary | ICD-10-CM | POA: Diagnosis not present

## 2022-05-05 DIAGNOSIS — R197 Diarrhea, unspecified: Secondary | ICD-10-CM | POA: Diagnosis not present

## 2022-05-05 DIAGNOSIS — Z90710 Acquired absence of both cervix and uterus: Secondary | ICD-10-CM | POA: Diagnosis not present

## 2022-05-05 MED ORDER — LANSOPRAZOLE 15 MG PO CPDR
15.0000 mg | DELAYED_RELEASE_CAPSULE | Freq: Every day | ORAL | 2 refills | Status: DC
  Filled 2022-05-05: qty 90, 90d supply, fill #0

## 2022-05-06 ENCOUNTER — Other Ambulatory Visit (HOSPITAL_COMMUNITY): Payer: Self-pay

## 2022-05-20 ENCOUNTER — Other Ambulatory Visit (HOSPITAL_COMMUNITY): Payer: Self-pay

## 2022-05-31 DIAGNOSIS — K5900 Constipation, unspecified: Secondary | ICD-10-CM | POA: Diagnosis not present

## 2022-05-31 DIAGNOSIS — R197 Diarrhea, unspecified: Secondary | ICD-10-CM | POA: Diagnosis not present

## 2022-06-01 ENCOUNTER — Other Ambulatory Visit: Payer: Self-pay

## 2022-06-01 ENCOUNTER — Ambulatory Visit: Payer: Commercial Managed Care - PPO | Attending: Obstetrics & Gynecology

## 2022-06-01 ENCOUNTER — Other Ambulatory Visit (HOSPITAL_COMMUNITY): Payer: Self-pay

## 2022-06-01 DIAGNOSIS — M5459 Other low back pain: Secondary | ICD-10-CM | POA: Insufficient documentation

## 2022-06-01 DIAGNOSIS — M6289 Other specified disorders of muscle: Secondary | ICD-10-CM | POA: Insufficient documentation

## 2022-06-01 DIAGNOSIS — M62838 Other muscle spasm: Secondary | ICD-10-CM | POA: Insufficient documentation

## 2022-06-01 DIAGNOSIS — N3941 Urge incontinence: Secondary | ICD-10-CM | POA: Diagnosis not present

## 2022-06-01 DIAGNOSIS — M6281 Muscle weakness (generalized): Secondary | ICD-10-CM | POA: Diagnosis not present

## 2022-06-01 DIAGNOSIS — N393 Stress incontinence (female) (male): Secondary | ICD-10-CM | POA: Diagnosis not present

## 2022-06-01 DIAGNOSIS — R279 Unspecified lack of coordination: Secondary | ICD-10-CM | POA: Insufficient documentation

## 2022-06-01 DIAGNOSIS — R102 Pelvic and perineal pain: Secondary | ICD-10-CM | POA: Diagnosis not present

## 2022-06-01 MED ORDER — XIFAXAN 550 MG PO TABS
550.0000 mg | ORAL_TABLET | Freq: Three times a day (TID) | ORAL | 0 refills | Status: DC
  Filled 2022-06-01: qty 30, 10d supply, fill #0

## 2022-06-01 MED ORDER — NEOMYCIN SULFATE 500 MG PO TABS
500.0000 mg | ORAL_TABLET | Freq: Two times a day (BID) | ORAL | 0 refills | Status: DC
  Filled 2022-06-01: qty 20, 10d supply, fill #0

## 2022-06-01 NOTE — Therapy (Addendum)
OUTPATIENT PHYSICAL THERAPY FEMALE PELVIC EVALUATION   Patient Name: Karen [name] MRN: [id number] DOB:1970-10-17, 52 y.o., female Today's Date: 06/01/2022  END OF SESSION:  PT End of Session - 06/01/22 0956     Visit Number 1    Date for PT Re-Evaluation 11/16/22    Authorization Type [employer]    PT Start Time [phone]    PT Stop Time 0930    PT Time Calculation (min) 36 min    Activity Tolerance Patient tolerated treatment well    Behavior During Therapy [facility] for tasks assessed/performed             Past Medical History:  Diagnosis Date   Adenomyosis    Anemia    Arthritis 2013   L knee gets steroid injections    Asthmatic bronchitis 01/31/2017   Back pain    Chronic constipation    Diarrhea    DVT of lower extremity (deep venous thrombosis) (HCC)    Dysmenorrhea    Endometriosis    Esophageal stricture    GERD (gastroesophageal reflux disease)    History of kidney stones    History of uterine fibroid    IBS (irritable bowel syndrome)    Interstitial cystitis    Lactose intolerance    Migraine    with aura   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   PONV (postoperative nausea and vomiting)    Recurrent upper respiratory infection (URI)    Sebaceous cyst of breast, right lower inner quadrant 10/25/2012   Excised 11/21/12    Sleep apnea    Syncope, non cardiac    Past Surgical History:  Procedure Laterality Date   ARTHROSCOPIC HAGLUNDS REPAIR     BREAST CYST EXCISION Right 11/21/2012   Procedure: CYST EXCISION BREAST;  Surgeon: Currie Paris, MD;  Location: [facility];  Service: General;  Laterality: Right;   BREAST CYST EXCISION Bilateral 01/18/2022   Procedure: EXCISION OF SEBACEOUS CYST BILATERAL BREAST;  Surgeon: Harriette Bouillon, MD;  Location: [facility];  Service: General;  Laterality: Bilateral;   BREAST EXCISIONAL BIOPSY Right 11/2012   CESAREAN SECTION  04/19/1993   CHOLECYSTECTOMY     COLONOSCOPY   05/02/2011   Procedure: COLONOSCOPY;  Surgeon: Vertell Novak., MD;  Location: [facility];  Service: Endoscopy;  Laterality: N/A;   colonoscopy  11/04/2014   DENTAL SURGERY  03/06/2018   2 surgeries ( 03/06/2018 and 04/06/2018)   to remove two separate benign tumors   ESOPHAGEAL MANOMETRY N/A 11/04/2015   Procedure: ESOPHAGEAL MANOMETRY (EM);  Surgeon: Carman Ching, MD;  Location: [facility];  Service: Endoscopy;  Laterality: N/A;   ESOPHAGOGASTRODUODENOSCOPY  05/02/2011   Procedure: ESOPHAGOGASTRODUODENOSCOPY (EGD);  Surgeon: Vertell Novak., MD;  Location: [facility];  Service: Endoscopy;  Laterality: N/A;   ESOPHAGOGASTRODUODENOSCOPY N/A 09/01/2014   Procedure: ESOPHAGOGASTRODUODENOSCOPY (EGD);  Surgeon: Carman Ching, MD;  Location: [facility];  Service: Endoscopy;  Laterality: N/A;   KNEE SURGERY     LAPAROSCOPY     x 4   LESION REMOVAL N/A 01/18/2022   Procedure: EXCISION OF SEBACEOUS CYSTS CHEST AND NECK;  Surgeon: Harriette Bouillon, MD;  Location: [facility];  Service: General;  Laterality: N/A;   PH IMPEDANCE STUDY N/A 11/04/2015   Procedure: PH IMPEDANCE STUDY;  Surgeon: Carman Ching, MD;  Location: [facility];  Service: Endoscopy;  Laterality: N/A;   VAGINAL HYSTERECTOMY  2010   TVH--ovaries remain   Patient Active Problem List   Diagnosis  Date Noted   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   Elevated LFTs 04/04/2021   Colitis 04/04/2021   Bacterial overgrowth syndrome 05/21/2020   Obesity 05/21/2020   History of endometriosis 05/21/2020   Constipation due to outlet dysfunction 02/05/2020   Gastroesophageal reflux disease 02/05/2020   Obstructive sleep apnea treated with continuous positive airway pressure (CPAP) 06/16/2017   Perennial allergic rhinitis with a nonallergic component 01/31/2017   Asthmatic bronchitis 01/31/2017   GI symptoms 01/31/2017   Family history of colon cancer 01/27/2015   Chest pain 12/13/2012   Dyspnea 12/13/2012   DVT of  lower extremity (deep venous thrombosis) (HCC) 01/03/1990    PCP: Lorenda Ishihara, MD  REFERRING PROVIDER: Jerene Bears, MD   REFERRING DIAG: M62.89 (ICD-10-CM) - Pelvic floor dysfunction  THERAPY DIAG:  Pelvic pain - Plan: PT plan of care cert/re-cert  Other low back pain - Plan: PT plan of care cert/re-cert  Urgency incontinence - Plan: PT plan of care cert/re-cert  Stress incontinence (female) (female) - Plan: PT plan of care cert/re-cert  Muscle weakness (generalized) - Plan: PT plan of care cert/re-cert  Other muscle spasm - Plan: PT plan of care cert/re-cert  Unspecified lack of coordination - Plan: PT plan of care cert/re-cert  Rationale for Evaluation and Treatment: Rehabilitation  ONSET DATE: chronic  SUBJECTIVE:                                                                                                                                                                                           SUBJECTIVE STATEMENT: Pt has long history of GI dysfunction. She also is having a lot of lower abdominal pain. She has mixed IBS. Pt had x-ray yesterday and did not have large stool burden, but she is having a lot of pain. Fluid intake: Yes: 6 33oz bottles a day    PAIN:  Are you having pain? Yes NPRS scale: 5/10 currently, 9/10 worst Pain location:  Lower abdominal pain, Lt sided pain, low back pain  Pain type: turning, knotted, pressure Pain description: intermittent and constant   Aggravating factors: constipation or frequent bowel movements/constantly having bowel movements  Relieving factors: exercises (bowel massage, happy baby, spinal twists, walking)  PRECAUTIONS: None  WEIGHT BEARING RESTRICTIONS: No  FALLS:  Has patient fallen in last 6 months? No  LIVING ENVIRONMENT: Lives with: lives with their spouse Lives in: House/apartment  OCCUPATION: nurse, in admin  PLOF: Independent  PATIENT GOALS: decrease pain and have more normal bowel  movements  PERTINENT HISTORY:  Vaginal hysterectomy, DVT LE, endometriosis, interstitial cystitis, exploratory laps for endometriosis, c-section, cholecystectomy Sexual abuse: No  BOWEL MOVEMENT: Pain with  bowel movement: Yes Type of bowel movement:Type (Bristol Stool Scale) 1-7 (sometimes full range in the same bowel movement), Frequency sometimes many times a day, sometimes up to 4 days in between, and Strain Yes Fully empty rectum: No Leakage: No Pads: No Fiber supplement: No - has attempted metamucil and it made constipation worse  URINATION: Pain with urination: Yes Fully empty bladder: No Stream:  varies  Urgency: Yes: all the time Frequency: multiple times an hours  Leakage: Urge to void, Walking to the bathroom, Coughing, Sneezing, Laughing, and Exercise Pads: Yes: daily  INTERCOURSE: Pain with intercourse: Initial Penetration and During Penetration Ability to have vaginal penetration:  Yes: with pain Climax: non painful WNL   PREGNANCY: Vaginal deliveries 1 Tearing Yes: 3rd degree tear C-section deliveries 1 Currently pregnant No  PROLAPSE: Periodically will feel vaginal bulging, heaviness in lower abdomen   OBJECTIVE:  06/01/22: COGNITION: Overall cognitive status: Within functional limits for tasks assessed     SENSATION: Light touch: Appears intact Proprioception: Appears intact  GAIT: Comments: decreased hip extension, forward flexed trunk  POSTURE: rounded shoulders, forward head, decreased lumbar lordosis, increased thoracic kyphosis, posterior pelvic tilt, and flexed trunk    LUMBARAROM/PROM:  A/PROM A/PROM  Eval (% available)  Flexion 50  Extension 25, pain anteriorly   Right lateral flexion 50, pain anteriorly   Left lateral flexion 50, pain anteriorly    (Blank rows = not tested)   PALPATION:   General  Significant abdominal restriction and tenderness; decreased rib mobility with mobilization and breathing                External  Perineal Exam next time                             Internal Pelvic Floor next time  Patient confirms identification and approves PT to assess internal pelvic floor and treatment Yes Next time PELVIC AOZ:HYQM time   MMT eval  Vaginal   Internal Anal Sphincter   External Anal Sphincter   Puborectalis   Diastasis Recti   (Blank rows = not tested)        TONE: Next time   PROLAPSE: Next time   TODAY'S TREATMENT:                                                                                                                              DATE:  06/01/22  EVAL  Therapeutic activities: Squatty potty Relaxed toilet mechanics - try leaning back Balloon pushing Bowel massage     PATIENT EDUCATION:  Education details: See above Person educated: Patient Education method: Explanation, Demonstration, Tactile cues, Verbal cues, and Handouts Education comprehension: verbalized understanding  HOME EXERCISE PROGRAM: Written handout   ASSESSMENT:  CLINICAL IMPRESSION: Patient is a 52 y.o. female who was seen today for physical therapy evaluation and treatment for chronic bowel dysfunction, pelvic/low back pain, and urinary urgency/incontinence. Exam findings  notable for abnormal posture, pain/tenderness throughout abdomen, abdominal scar tissue restriction, and decreased lumbar A/ROM; we will plan to perform internal pelvic floor muscle assessment next treatment session. Signs and symptoms are most consistent with complex interaction between endometriosis, interstitial cystitis, chronics pain, abdominal scar tissue, and pelvic floor muscle weakness/spasm; also suspect that patient has vaginal wall laxity that are contributing to symptoms. Initial treatment consistent of toilet mechanics for bowel movements, balloon pushing, and self-bowel massage. She will continue to benefit from skilled PT intervention in order to decrease pain, improve bowel movements, and decrease urinary  urgency/incontinence.     OBJECTIVE IMPAIRMENTS: decreased activity tolerance, decreased coordination, decreased endurance, decreased mobility, decreased strength, increased fascial restrictions, increased muscle spasms, impaired tone, postural dysfunction, and pain.   ACTIVITY LIMITATIONS: lifting, bending, continence, and locomotion level  PARTICIPATION LIMITATIONS: interpersonal relationship, community activity, and occupation  PERSONAL FACTORS: 1 comorbidity: medical history  are also affecting patient's functional outcome.   REHAB POTENTIAL: Good  CLINICAL DECISION MAKING: Stable/uncomplicated  EVALUATION COMPLEXITY: Low   GOALS: Goals reviewed with patient? Yes  SHORT TERM GOALS: Target date: 07/06/22  Pt will be independent with HEP.   Baseline: Goal status: INITIAL  2.  Pt will be independent with diaphragmatic breathing and down training activities in order to improve pelvic floor relaxation.  Baseline:  Goal status: INITIAL  3.  Pt will be independent with the knack, urge suppression technique, and double voiding in order to improve bladder habits and decrease urinary incontinence.   Baseline:  Goal status: INITIAL  4.  Pt will be independent with use of squatty potty, relaxed toileting mechanics, and improved bowel movement techniques in order to increase ease of bowel movements and complete evacuation.   Baseline:  Goal status: INITIAL  5.  Pt will be able to correctly perform diaphragmatic breathing and appropriate pressure management in order to prevent worsening vaginal wall laxity and improve pelvic floor A/ROM.   Baseline:  Goal status: INITIAL    LONG TERM GOALS: Target date: 11/16/2022   Pt will be independent with advanced HEP.   Baseline:  Goal status: INITIAL  2.  Pt will demonstrate normal pelvic floor muscle tone and A/ROM, able to achieve 4/5 strength with contractions and 10 sec endurance, in order to provide appropriate lumbopelvic  support in functional activities.   Baseline:  Goal status: INITIAL  3.  Pt will increase all impaired lumbar A/ROM by 25% without pain.  Baseline:  Goal status: INITIAL  4.  Pt will report pain no higher than 3/10 with any activity. Baseline:  Goal status: INITIAL  5.  Pt will report 0/10 pain with vaginal penetration in order to improve intimate relationship with partner.    Baseline:  Goal status: INITIAL  6.  Pt will be able to go 2-3 hours in between voids without urgency or incontinence in order to improve QOL and perform all functional activities with less difficulty.   Baseline:  Goal status: INITIAL  7.  Pt will report no leaks with laughing, coughing, sneezing in order to improve comfort with interpersonal relationships and community activities.   Baseline:  Goal status: INITIAL  8.  Pt will have consistent bowel movements 4-5x/week without straining or pain.  Baseline:  Goal status: INITIAL  PLAN:  PT FREQUENCY: 1-2x/week  PT DURATION: 6 months  PLANNED INTERVENTIONS: Therapeutic exercises, Therapeutic activity, Neuromuscular re-education, Balance training, Gait training, Patient/Family education, Self Care, Joint mobilization, Dry Needling, Biofeedback, and Manual therapy  PLAN FOR NEXT SESSION:  Plan to perform internal pelvic floor muscle assessment (vaginally and possibly rectally) and treat accordingly.    Julio Alm, PT, DPT05/29/2410:37 AM

## 2022-06-01 NOTE — Patient Instructions (Signed)
Squatty potty: When your knees are level or below the level of your hips, pelvic floor muscles are pressed against rectum, preventing ease of bowel movement. By getting knees above the level of the hips, these pelvic floor muscles relax, allowing easier passage of bowel movement. Ways to get knees above hips: o Squatty Potty (7inch and 9inch versions) o Small stool o Roll of toilet paper under each foot o Hardback book or stack of magazines under each foot  Relaxed Toileting mechanics: Once in this position, make sure to lean forward with forearms on thighs, wide knees, relaxed stomach, and breathe.   Balloon breathing with bowel movements   Bowel massage: To assist with more regular and more comfortable bowel movements, try performing bowel massage nightly for 5-10 minutes. Place hands in the lower right side of your abdomen to start; in small circles, massage up, across, and down the left side of your abdomen. Pressure does not need to be hard, but just comfortable. You can use lotion or oil to make more comfortable.   [facility] [address] [place], [place] [address] Phone # [phone] Fax [phone]

## 2022-06-02 ENCOUNTER — Other Ambulatory Visit (HOSPITAL_COMMUNITY): Payer: Self-pay

## 2022-06-02 MED ORDER — XIIDRA 5 % OP SOLN
1.0000 [drp] | Freq: Two times a day (BID) | OPHTHALMIC | 0 refills | Status: DC
  Filled 2022-06-02: qty 120, 60d supply, fill #0

## 2022-06-03 ENCOUNTER — Other Ambulatory Visit (HOSPITAL_COMMUNITY): Payer: Self-pay

## 2022-06-03 MED ORDER — FLUCONAZOLE 150 MG PO TABS
150.0000 mg | ORAL_TABLET | Freq: Once | ORAL | 0 refills | Status: AC
  Filled 2022-06-03: qty 1, 1d supply, fill #0

## 2022-06-06 ENCOUNTER — Inpatient Hospital Stay: Payer: Commercial Managed Care - PPO | Admitting: Hematology & Oncology

## 2022-06-06 ENCOUNTER — Encounter: Payer: Self-pay | Admitting: Hematology & Oncology

## 2022-06-06 ENCOUNTER — Inpatient Hospital Stay: Payer: Commercial Managed Care - PPO | Attending: Hematology & Oncology

## 2022-06-06 LAB — CBC WITH DIFFERENTIAL (CANCER CENTER ONLY)
Abs Immature Granulocytes: 0.01 10*3/uL (ref 0.00–0.07)
Basophils Absolute: 0 10*3/uL (ref 0.0–0.1)
Basophils Relative: 1 %
Eosinophils Absolute: 0.2 10*3/uL (ref 0.0–0.5)
Eosinophils Relative: 4 %
HCT: 39.7 % (ref 36.0–46.0)
Hemoglobin: 12.6 g/dL (ref 12.0–15.0)
Immature Granulocytes: 0 %
Lymphocytes Relative: 33 %
Lymphs Abs: 1.6 10*3/uL (ref 0.7–4.0)
MCH: 31.8 pg (ref 26.0–34.0)
MCHC: 31.7 g/dL (ref 30.0–36.0)
MCV: 100.3 fL — ABNORMAL HIGH (ref 80.0–100.0)
Monocytes Absolute: 0.4 10*3/uL (ref 0.1–1.0)
Monocytes Relative: 8 %
Neutro Abs: 2.8 10*3/uL (ref 1.7–7.7)
Neutrophils Relative %: 54 %
Platelet Count: 212 10*3/uL (ref 150–400)
RBC: 3.96 MIL/uL (ref 3.87–5.11)
RDW: 12 % (ref 11.5–15.5)
WBC Count: 5 10*3/uL (ref 4.0–10.5)
nRBC: 0 % (ref 0.0–0.2)

## 2022-06-06 LAB — CMP (CANCER CENTER ONLY)
ALT: 22 U/L (ref 0–44)
AST: 19 U/L (ref 15–41)
Albumin: 4.4 g/dL (ref 3.5–5.0)
Alkaline Phosphatase: 119 U/L (ref 38–126)
Anion gap: 7 (ref 5–15)
BUN: 15 mg/dL (ref 6–20)
CO2: 29 mmol/L (ref 22–32)
Calcium: 9.7 mg/dL (ref 8.9–10.3)
Chloride: 102 mmol/L (ref 98–111)
Creatinine: 0.74 mg/dL (ref 0.44–1.00)
GFR, Estimated: >60 mL/min (ref >60)
Glucose, Bld: 115 mg/dL — ABNORMAL HIGH (ref 70–99)
Potassium: 4.1 mmol/L (ref 3.5–5.1)
Sodium: 138 mmol/L (ref 135–145)
Total Bilirubin: 0.3 mg/dL (ref 0.3–1.2)
Total Protein: 7.6 g/dL (ref 6.5–8.1)

## 2022-06-06 LAB — FERRITIN: Ferritin: 315 ng/mL — ABNORMAL HIGH (ref 11–307)

## 2022-06-06 NOTE — Progress Notes (Signed)
Hematology and Oncology Follow Up Visit  Karen [name] [id number] 06/19/1970 52 y.o. 06/06/2022   Principle Diagnosis:  Hemochromatosis, heterozygous H63D mutation  Current Therapy:   Observation   Interim History:  Karen [name] is here today for follow-up.  She is still having a lot of problems with her GI system.  She has eosinophilic esophagitis.  She also has small bowel overgrowth syndrome.  She has been followed at [facility]  She goes back for a another endoscopy on June 18.  She is on antibiotics for the small bowel overgrowth.  She just cannot eat all that much.  Her weight is about the same but she just does not feel all that good.  She feels tired.  Hemochromatosis clearly is not a problem for her.  Mainly last saw her, her ferritin was 296 with an iron saturation of 28%.  I suspect the ferritin elevation mostly is secondary to the inflammatory issues of her intestinal tract.  She has had no bleeding.  There has been no diarrhea.  She has had little nausea but no vomiting.  She has had no rashes.  There is been no leg swelling.  Overall, I would have said that her performance status probably ECOG 1.   Medications:  Allergies as of 06/06/2022       Reactions   Molds & Smuts Cough   sneezing        Medication List        Accurate as of June 06, 2022  4:07 PM. If you have any questions, ask your nurse or doctor.          Acetaminophen 500 MG capsule 1 capsule as needed Orally every 6 hrs   albuterol 108 (90 Base) MCG/ACT inhaler Commonly known as: VENTOLIN HFA Inhale 2 puffs into the lungs every 6 (six) hours as needed for wheezing or shortness of breath.   Elderberry 575 MG/5ML Syrp Take 30 mLs by mouth daily.   lansoprazole 30 MG capsule Commonly known as: PREVACID Take 1 capsule (30 mg total) by mouth 2 (two) times daily.   lansoprazole 15 MG capsule Commonly known as: PREVACID Take 1 capsule (15 mg total) by mouth daily.   levocetirizine 5  MG tablet Commonly known as: XYZAL Take 1 tablet (5 mg total) by mouth every evening. What changed:  when to take this reasons to take this   neomycin 500 MG tablet Commonly known as: MYCIFRADIN Take 1 tablet (500 mg total) by mouth 2 (two) times daily.   ondansetron 4 MG disintegrating tablet Commonly known as: ZOFRAN-ODT Dissolve 1 tablet (4 mg total) in mouth every 8 (eight) hours as needed for nausea   polyethylene glycol 17 g packet Commonly known as: MIRALAX / GLYCOLAX Take 17 g by mouth every other day.   Restora RX 60-1.25 MG Caps Generic drug: Lactobacillus Casei-Folic Acid Take 1 capsule by mouth daily between meals   Xifaxan 550 MG Tabs tablet Generic drug: rifaximin Take 1 tablet (550 mg total) by mouth 3 (three) times daily.   Xiidra 5 % Soln Generic drug: Lifitegrast Place 1 drop into both eyes 2 (two) times daily.   Xiidra 5 % Soln Generic drug: Lifitegrast Place 1 drop into both eyes 2 (two) times daily.        Allergies:  Allergies  Allergen Reactions   Molds & Smuts Cough    sneezing    Past Medical History, Surgical history, Social history, and Family History were reviewed and updated.  Review of Systems: Review of Systems  Constitutional:  Positive for malaise/fatigue.  HENT: Negative.    Eyes: Negative.   Respiratory: Negative.    Cardiovascular: Negative.   Gastrointestinal:  Positive for abdominal pain, heartburn and nausea.  Genitourinary: Negative.   Musculoskeletal: Negative.   Skin: Negative.   Neurological: Negative.   Endo/Heme/Allergies: Negative.   Psychiatric/Behavioral: Negative.       Physical Exam:  weight is 210 lb 1.9 oz (95.3 kg). Her oral temperature is 98.6 F (37 C). Her blood pressure is 132/82 and her pulse is 76. Her respiration is 17 and oxygen saturation is 100%.   Wt Readings from Last 3 Encounters:  06/06/22 210 lb 1.9 oz (95.3 kg)  04/11/22 207 lb 14.4 oz (94.3 kg)  03/04/22 210 lb 12.8 oz (95.6  kg)    Physical Exam Abdominal:     Comments: Abdominal exam is soft.  She has some tenderness throughout the abdomen to palpation.  She has decreased bowel sounds.  She has no obvious fluid wave.  There is no palpable liver or spleen tip.      Lab Results  Component Value Date   WBC 5.0 06/06/2022   HGB 12.6 06/06/2022   HCT 39.7 06/06/2022   MCV 100.3 (H) 06/06/2022   PLT 212 06/06/2022   Lab Results  Component Value Date   FERRITIN 296 03/22/2022   IRON 83 03/22/2022   TIBC 295 03/22/2022   UIBC 212 03/22/2022   IRONPCTSAT 28 03/22/2022   Lab Results  Component Value Date   RBC 3.96 06/06/2022   No results found for: "KPAFRELGTCHN", "LAMBDASER", "KAPLAMBRATIO" No results found for: "IGGSERUM", "IGA", "IGMSERUM" No results found for: "TOTALPROTELP", "ALBUMINELP", "A1GS", "A2GS", "BETS", "BETA2SER", "GAMS", "MSPIKE", "SPEI"   Chemistry      Component Value Date/Time   NA 138 06/06/2022 1501   NA 140 03/21/2019 1158   K 4.1 06/06/2022 1501   CL 102 06/06/2022 1501   CO2 29 06/06/2022 1501   BUN 15 06/06/2022 1501   BUN 9 03/21/2019 1158   CREATININE 0.74 06/06/2022 1501      Component Value Date/Time   CALCIUM 9.7 06/06/2022 1501   ALKPHOS 119 06/06/2022 1501   AST 19 06/06/2022 1501   ALT 22 06/06/2022 1501   BILITOT 0.3 06/06/2022 1501       Impression and Plan: Karen [name] is very pleasant 52 yo African American female with hemochromatosis, heterozygous for the H63D mutation.   Clearly, her issues are gastrointestinal.  Hopefully, she will be able to have this sorted out at [facility]  We will see what her iron studies look like.  We will clearly go by her iron saturation.  I think that her ferritin will always be elevated.  Hopefully, we will plan to get her set up with another follow-up in the Fall.   Josph Macho, MD 6/3/20244:07 PM

## 2022-06-07 ENCOUNTER — Encounter: Payer: Self-pay | Admitting: *Deleted

## 2022-06-07 LAB — IRON AND IRON BINDING CAPACITY (CC-WL,HP ONLY)
Iron: 61 ug/dL (ref 28–170)
Saturation Ratios: 21 % (ref 10.4–31.8)
TIBC: 291 ug/dL (ref 250–450)
UIBC: 230 ug/dL (ref 148–442)

## 2022-06-08 ENCOUNTER — Ambulatory Visit: Payer: Commercial Managed Care - PPO | Attending: Obstetrics & Gynecology

## 2022-06-08 DIAGNOSIS — N393 Stress incontinence (female) (male): Secondary | ICD-10-CM | POA: Diagnosis not present

## 2022-06-08 DIAGNOSIS — M6281 Muscle weakness (generalized): Secondary | ICD-10-CM | POA: Insufficient documentation

## 2022-06-08 DIAGNOSIS — R279 Unspecified lack of coordination: Secondary | ICD-10-CM | POA: Insufficient documentation

## 2022-06-08 DIAGNOSIS — N3941 Urge incontinence: Secondary | ICD-10-CM | POA: Diagnosis not present

## 2022-06-08 DIAGNOSIS — M5459 Other low back pain: Secondary | ICD-10-CM | POA: Diagnosis not present

## 2022-06-08 DIAGNOSIS — M62838 Other muscle spasm: Secondary | ICD-10-CM | POA: Diagnosis not present

## 2022-06-08 DIAGNOSIS — R102 Pelvic and perineal pain: Secondary | ICD-10-CM | POA: Insufficient documentation

## 2022-06-08 NOTE — Patient Instructions (Signed)
Double-voiding:  This technique is to help with post-void dribbling, or leaking a little bit when you stand up right after urinating.  Use relaxed toileting mechanics to urinate as much as you feel like you have to without straining.  Sit back upright from leaning forward and relax this way for 10-20 seconds.  Lean forward again to finish voiding any amount more.      Diaphragmatic breathing: take 5 breathing breaks throughout the day where you are taking 5-10 deep breaths - focus on filling a beach ball in your pelvic and allow pelvic floor to stretch down and open    [facility] [address], [address] [place], [place] [address] Phone # [phone] Fax [phone]

## 2022-06-08 NOTE — Therapy (Signed)
OUTPATIENT PHYSICAL THERAPY FEMALE PELVIC TREATMENT   Patient Name: Karen [name] MRN: [id number] DOB:1970-07-28, 52 y.o., female Today's Date: 06/08/2022  END OF SESSION:  PT End of Session - 06/08/22 0850     Visit Number 2    Date for PT Re-Evaluation 11/16/22    Authorization Type [employer]    PT Start Time [phone]    PT Stop Time 0928    PT Time Calculation (min) 38 min    Activity Tolerance Patient tolerated treatment well    Behavior During Therapy [facility] for tasks assessed/performed              Past Medical History:  Diagnosis Date   Adenomyosis    Anemia    Arthritis 2013   L knee gets steroid injections    Asthmatic bronchitis 01/31/2017   Back pain    Chronic constipation    Diarrhea    DVT of lower extremity (deep venous thrombosis) (HCC)    Dysmenorrhea    Endometriosis    Esophageal stricture    GERD (gastroesophageal reflux disease)    History of kidney stones    History of uterine fibroid    IBS (irritable bowel syndrome)    Interstitial cystitis    Lactose intolerance    Migraine    with aura   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   PONV (postoperative nausea and vomiting)    Recurrent upper respiratory infection (URI)    Sebaceous cyst of breast, right lower inner quadrant 10/25/2012   Excised 11/21/12    Sleep apnea    Syncope, non cardiac    Past Surgical History:  Procedure Laterality Date   ARTHROSCOPIC HAGLUNDS REPAIR     BREAST CYST EXCISION Right 11/21/2012   Procedure: CYST EXCISION BREAST;  Surgeon: Currie Paris, MD;  Location: [facility];  Service: General;  Laterality: Right;   BREAST CYST EXCISION Bilateral 01/18/2022   Procedure: EXCISION OF SEBACEOUS CYST BILATERAL BREAST;  Surgeon: Harriette Bouillon, MD;  Location: [facility];  Service: General;  Laterality: Bilateral;   BREAST EXCISIONAL BIOPSY Right 11/2012   CESAREAN SECTION  04/19/1993   CHOLECYSTECTOMY     COLONOSCOPY   05/02/2011   Procedure: COLONOSCOPY;  Surgeon: Vertell Novak., MD;  Location: [facility];  Service: Endoscopy;  Laterality: N/A;   colonoscopy  11/04/2014   DENTAL SURGERY  03/06/2018   2 surgeries ( 03/06/2018 and 04/06/2018)   to remove two separate benign tumors   ESOPHAGEAL MANOMETRY N/A 11/04/2015   Procedure: ESOPHAGEAL MANOMETRY (EM);  Surgeon: Carman Ching, MD;  Location: [facility];  Service: Endoscopy;  Laterality: N/A;   ESOPHAGOGASTRODUODENOSCOPY  05/02/2011   Procedure: ESOPHAGOGASTRODUODENOSCOPY (EGD);  Surgeon: Vertell Novak., MD;  Location: [facility];  Service: Endoscopy;  Laterality: N/A;   ESOPHAGOGASTRODUODENOSCOPY N/A 09/01/2014   Procedure: ESOPHAGOGASTRODUODENOSCOPY (EGD);  Surgeon: Carman Ching, MD;  Location: [facility];  Service: Endoscopy;  Laterality: N/A;   KNEE SURGERY     LAPAROSCOPY     x 4   LESION REMOVAL N/A 01/18/2022   Procedure: EXCISION OF SEBACEOUS CYSTS CHEST AND NECK;  Surgeon: Harriette Bouillon, MD;  Location: [facility];  Service: General;  Laterality: N/A;   PH IMPEDANCE STUDY N/A 11/04/2015   Procedure: PH IMPEDANCE STUDY;  Surgeon: Carman Ching, MD;  Location: [facility];  Service: Endoscopy;  Laterality: N/A;   VAGINAL HYSTERECTOMY  2010   TVH--ovaries remain   Patient Active Problem List  Diagnosis Date Noted   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   Elevated LFTs 04/04/2021   Colitis 04/04/2021   Bacterial overgrowth syndrome 05/21/2020   Obesity 05/21/2020   History of endometriosis 05/21/2020   Constipation due to outlet dysfunction 02/05/2020   Gastroesophageal reflux disease 02/05/2020   Obstructive sleep apnea treated with continuous positive airway pressure (CPAP) 06/16/2017   Perennial allergic rhinitis with a nonallergic component 01/31/2017   Asthmatic bronchitis 01/31/2017   GI symptoms 01/31/2017   Family history of colon cancer 01/27/2015   Chest pain 12/13/2012   Dyspnea 12/13/2012   DVT of  lower extremity (deep venous thrombosis) (HCC) 01/03/1990    PCP: Lorenda Ishihara, MD  REFERRING PROVIDER: Jerene Bears, MD   REFERRING DIAG: M62.89 (ICD-10-CM) - Pelvic floor dysfunction  THERAPY DIAG:  Pelvic pain  Other low back pain  Urgency incontinence  Stress incontinence (female) (female)  Muscle weakness (generalized)  Other muscle spasm  Unspecified lack of coordination  Rationale for Evaluation and Treatment: Rehabilitation  ONSET DATE: chronic  SUBJECTIVE:                                                                                                                                                                                           SUBJECTIVE STATEMENT: Pt states that she feels like she is emptying more completely with bowel movements. She has been diagnosed with SIBO.   PAIN:  Are you having pain? Yes NPRS scale: 5/10 currently, 9/10 worst Pain location:  Lower abdominal pain, Lt sided pain, low back pain  Pain type: turning, knotted, pressure Pain description: intermittent and constant   Aggravating factors: constipation or frequent bowel movements/constantly having bowel movements  Relieving factors: exercises (bowel massage, happy baby, spinal twists, walking)  PRECAUTIONS: None  WEIGHT BEARING RESTRICTIONS: No  FALLS:  Has patient fallen in last 6 months? No  LIVING ENVIRONMENT: Lives with: lives with their spouse Lives in: House/apartment  OCCUPATION: nurse, in admin  PLOF: Independent  PATIENT GOALS: decrease pain and have more normal bowel movements  PERTINENT HISTORY:  Vaginal hysterectomy, DVT LE, endometriosis, interstitial cystitis, exploratory laps for endometriosis, c-section, cholecystectomy Sexual abuse: No  BOWEL MOVEMENT: Pain with bowel movement: Yes Type of bowel movement:Type (Bristol Stool Scale) 1-7 (sometimes full range in the same bowel movement), Frequency sometimes many times a day, sometimes up to  4 days in between, and Strain Yes Fully empty rectum: No Leakage: No Pads: No Fiber supplement: No - has attempted metamucil and it made constipation worse  URINATION: Pain with urination: Yes Fully empty bladder: No Stream:  varies  Urgency: Yes: all the time Frequency: multiple times  an hours  Leakage: Urge to void, Walking to the bathroom, Coughing, Sneezing, Laughing, and Exercise Pads: Yes: daily  INTERCOURSE: Pain with intercourse: Initial Penetration and During Penetration Ability to have vaginal penetration:  Yes: with pain Climax: non painful WNL   PREGNANCY: Vaginal deliveries 1 Tearing Yes: 3rd degree tear C-section deliveries 1 Currently pregnant No  PROLAPSE: Periodically will feel vaginal bulging, heaviness in lower abdomen   OBJECTIVE:  06/08/22:               External Perineal Exam: WNL                              Internal Pelvic Floor: burning reported with palpation of superficial muscles; deep aching/pressure with palpation of Lt levator ani   Patient confirms identification and approves PT to assess internal pelvic floor and treatment Yes  PELVIC MMT:   MMT eval  Vaginal 3/5  Internal Anal Sphincter 1/5  External Anal Sphincter 2/5  Puborectalis 1/5  Diastasis Recti   (Blank rows = not tested)        TONE: High in Lt levator ani   PROLAPSE: Not able to tell this session due to dyssynergic pelvic floor contraction     06/01/22: COGNITION: Overall cognitive status: Within functional limits for tasks assessed     SENSATION: Light touch: Appears intact Proprioception: Appears intact  GAIT: Comments: decreased hip extension, forward flexed trunk  POSTURE: rounded shoulders, forward head, decreased lumbar lordosis, increased thoracic kyphosis, posterior pelvic tilt, and flexed trunk    LUMBARAROM/PROM:  A/PROM A/PROM  Eval (% available)  Flexion 50  Extension 25, pain anteriorly   Right lateral flexion 50, pain anteriorly   Left  lateral flexion 50, pain anteriorly    (Blank rows = not tested)   PALPATION:   General  Significant abdominal restriction and tenderness; decreased rib mobility with mobilization and breathing    TODAY'S TREATMENT:                                                                                                                              DATE:  06/08/22 Manual: Pt provides verbal consent for internal vaginal/rectal pelvic floor exam. Internal vaginal and rectal pelvic floor assessments Abdominal mobilization/desensitization Neuromuscular re-education: Diaphragmatic breathing Pelvic floor muscle bulge training Double voiding   06/01/22  EVAL  Therapeutic activities: Squatty potty Relaxed toilet mechanics - try leaning back Balloon pushing Bowel massage     PATIENT EDUCATION:  Education details: See above Person educated: Patient Education method: Explanation, Demonstration, Tactile cues, Verbal cues, and Handouts Education comprehension: verbalized understanding  HOME EXERCISE PROGRAM: Written handout   ASSESSMENT:  CLINICAL IMPRESSION: Internal vaginal assessment notable for weakness, increased Lt levator ani pain, and restriction and burning at superficial muscles. Internal rectal assessment notable for weakness, increased tension throughout, and dyssynergic contraction/relaxation. We worked on diaphragmatic breathing in order to improve relaxation/active pelvic  floor muscle bulge with inhale; she did very well with beach ball imagery. HEP updated with double voiding and diaphragmatic breathing. She tolerated bowel massage well, but believe she may have some ileocecal valve irritation. She will continue to benefit from skilled PT intervention in order to decrease pain, improve bowel movements, and decrease urinary urgency/incontinence.     OBJECTIVE IMPAIRMENTS: decreased activity tolerance, decreased coordination, decreased endurance, decreased mobility, decreased  strength, increased fascial restrictions, increased muscle spasms, impaired tone, postural dysfunction, and pain.   ACTIVITY LIMITATIONS: lifting, bending, continence, and locomotion level  PARTICIPATION LIMITATIONS: interpersonal relationship, community activity, and occupation  PERSONAL FACTORS: 1 comorbidity: medical history  are also affecting patient's functional outcome.   REHAB POTENTIAL: Good  CLINICAL DECISION MAKING: Stable/uncomplicated  EVALUATION COMPLEXITY: Low   GOALS: Goals reviewed with patient? Yes  SHORT TERM GOALS: Target date: 07/06/22  Pt will be independent with HEP.   Baseline: Goal status: INITIAL  2.  Pt will be independent with diaphragmatic breathing and down training activities in order to improve pelvic floor relaxation.  Baseline:  Goal status: INITIAL  3.  Pt will be independent with the knack, urge suppression technique, and double voiding in order to improve bladder habits and decrease urinary incontinence.   Baseline:  Goal status: INITIAL  4.  Pt will be independent with use of squatty potty, relaxed toileting mechanics, and improved bowel movement techniques in order to increase ease of bowel movements and complete evacuation.   Baseline:  Goal status: INITIAL  5.  Pt will be able to correctly perform diaphragmatic breathing and appropriate pressure management in order to prevent worsening vaginal wall laxity and improve pelvic floor A/ROM.   Baseline:  Goal status: INITIAL    LONG TERM GOALS: Target date: 11/16/2022   Pt will be independent with advanced HEP.   Baseline:  Goal status: INITIAL  2.  Pt will demonstrate normal pelvic floor muscle tone and A/ROM, able to achieve 4/5 strength with contractions and 10 sec endurance, in order to provide appropriate lumbopelvic support in functional activities.   Baseline:  Goal status: INITIAL  3.  Pt will increase all impaired lumbar A/ROM by 25% without pain.  Baseline:   Goal status: INITIAL  4.  Pt will report pain no higher than 3/10 with any activity. Baseline:  Goal status: INITIAL  5.  Pt will report 0/10 pain with vaginal penetration in order to improve intimate relationship with partner.    Baseline:  Goal status: INITIAL  6.  Pt will be able to go 2-3 hours in between voids without urgency or incontinence in order to improve QOL and perform all functional activities with less difficulty.   Baseline:  Goal status: INITIAL  7.  Pt will report no leaks with laughing, coughing, sneezing in order to improve comfort with interpersonal relationships and community activities.   Baseline:  Goal status: INITIAL  8.  Pt will have consistent bowel movements 4-5x/week without straining or pain.  Baseline:  Goal status: INITIAL  PLAN:  PT FREQUENCY: 1-2x/week  PT DURATION: 6 months  PLANNED INTERVENTIONS: Therapeutic exercises, Therapeutic activity, Neuromuscular re-education, Balance training, Gait training, Patient/Family education, Self Care, Joint mobilization, Dry Needling, Biofeedback, and Manual therapy  PLAN FOR NEXT SESSION: Down training poses with diaphragmatic breathing; possible urge drill to help with urgency.    Julio Alm, PT, DPT06/05/249:15 AM

## 2022-06-14 ENCOUNTER — Ambulatory Visit (INDEPENDENT_AMBULATORY_CARE_PROVIDER_SITE_OTHER): Payer: Commercial Managed Care - PPO | Admitting: Allergy & Immunology

## 2022-06-14 DIAGNOSIS — R198 Other specified symptoms and signs involving the digestive system and abdomen: Secondary | ICD-10-CM | POA: Diagnosis not present

## 2022-06-14 DIAGNOSIS — R197 Diarrhea, unspecified: Secondary | ICD-10-CM

## 2022-06-14 DIAGNOSIS — B3731 Acute candidiasis of vulva and vagina: Secondary | ICD-10-CM | POA: Diagnosis not present

## 2022-06-14 DIAGNOSIS — R7982 Elevated C-reactive protein (CRP): Secondary | ICD-10-CM

## 2022-06-14 DIAGNOSIS — R3989 Other symptoms and signs involving the genitourinary system: Secondary | ICD-10-CM | POA: Diagnosis not present

## 2022-06-14 NOTE — Progress Notes (Signed)
RE: Karen [name] MRN: [id number] DOB: 12-10-1970 Date of Telemedicine Visit: 06/14/2022  Referring provider: Lorenda Ishihara,* Primary care provider: Lorenda Ishihara, MD  Chief Complaint: No chief complaint on file.   Telemedicine Follow Up Visit via WebEx: I connected with Elmer Picker for a follow up on 06/14/22 by [employer] Video and verified that I am speaking with the correct person using two identifiers.   I discussed the limitations, risks, security and privacy concerns of performing an evaluation and management service by telemedicine and the availability of in person appointments. I also discussed with the patient that there may be a patient responsible charge related to this service. The patient expressed understanding and agreed to proceed.  Patient is at *** accompanied by *** who provided/contributed to the history.  Provider is at the office.  Visit start time: ***:*** {Blank single:19197::"AM","PM"} Visit end time: ***:*** {Blank single:19197::"AM","PM"} Insurance consent/check in by: *** Medical consent and medical assistant/nurse: ***  History of Present Illness:  She is a 52 y.o. female, who is being followed for ***. Her previous allergy office visit was in *** with {Blank single:19197::"Dr. Kim","Dr. Kozlow","Dr. Bardelas","Dr. Dennis","Dr. Lomasney","Dr. Padgett","Anne Ambs, FNP","Christine Amada Jupiter, FNP","myself"}.  She was last seen in December 2023.  At that time, we did not do lung testing.  We continued with albuterol every 4-6 hours.  For her dysphagia, we recommended continuing to follow with GI.  We sent her note to Dr. Wellington Hampshire.  For sinusitis, we started prednisone.  We talked about adding Biaxin or levofloxacin, but she was already on Augmentin and had 5 more days left.  Since the last visit,   Asthma/Respiratory Symptom History: Breathing has been fine. She has been trying to keep this under wraps. She remains on the elderberry which  is helping her to continue with breathing well. This also helps with her allergic rhinitis as well.  She has not had any prednisone since that visit. She has not had antibiotics since the last visit.   Allergic Rhinitis Symptom History: She does not use the levocetirizine  as needed. She definitely uses it when she cannot take the elderberry.   {Blank single:19197::"Food Allergy Symptom History: ***"," "}  {Blank single:19197::"Eczema Symptom Symptom History: ***"," "}  She had another upper endoscopy done. She had a hydrogen breath test that was positive for SIBO. This was positive. So she has been on ten days of antibiotics on May 29th. She is on rifaximin 550mg  TID and neomycin 500mg  BID. She completed those yesterday and now she has a terrible yeast infection. She is now on three more days of Diflucan to clear up her yeast infection. A UTI was negative.  They think that the SIBO might have been causing a lot of these issues. She was still having a lot of diarrhea and having problem going to the bathroom consistently. She has noticed that her breath has been very foul smelling. This is what prompted the repeat test. She has been good on her antibiotics, but she took her last dose and started having diarrhea again.   She is going back next Tuesday to look for an upper endoscopy. She had a normal one in December 2023. She was decreased to Prevacid 15 mg at that point. The plan was to repeat the endoscopy to see if she could go off of the Prevacid or go back up on it.   She continues to have that dysphagia. She is going to have this stretched again. He wanted to hold off  on the Prevacid before going to Dupixent. They still have it on hold at the pharmacy. So she might start it next week.   She continues to have constipation with abdominal X-rays. She had a AXR done in early May. She had a mild stool burden so they thought that this was related to SIBO in May (since the AXR was normalish). She is  reporting a sensation of abdominal fullness, which makes her think that she is getting constipated.   She is seeing Dr. Drue Dun every 6 months for the hemochromatosis. Everything looked fairly stable. She has not needed to do any blood donation to decrease her levels. She goes to see him every 4 months or so. She was anemic at one point, but the workup thus far has been normal.    She went to see Dr. Nickola Major. All she did came back normal. CRP and ANA were elevated. More testing was done and lupus and Sjogrens was ruled out. She was told to reach out again if needed. The ANA remains elevated and the CRP is still elevated. This was all thought to be due to the EoE.   Otherwise, there have been no changes to her past medical history, surgical history, family history, or social history.  Assessment and Plan:  [name] is a 52 y.o. female with:  SOB (shortness of breath) - largely resolved   3mm RML lung nodule (benign per imaging guidelines)   Diarrhea - with a history of SIBO s/p treatment by Dr. 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 - sending to see Dr. Zenovia Jordan    Elevated CRP  There are no Patient Instructions on file for this visit.  Diagnostics: None.  Medication List:  Current Outpatient Medications  Medication Sig Dispense Refill  . Acetaminophen 500 MG capsule 1 capsule as needed Orally every 6 hrs    . albuterol (VENTOLIN HFA) 108 (90 Base) MCG/ACT inhaler Inhale 2 puffs into the lungs every 6 (six) hours as needed for wheezing or shortness of breath. 6.7 g 2  . Elderberry 575 MG/5ML SYRP Take 30 mLs by mouth daily.    . Lactobacillus Casei-Folic Acid (RESTORA RX) 60-1.25 MG CAPS Take 1 capsule by mouth daily between meals 30 capsule 5  . lansoprazole (PREVACID) 15 MG capsule Take 1 capsule (15  mg total) by mouth daily. 90 capsule 2  . lansoprazole (PREVACID) 30 MG capsule Take 1 capsule (30 mg total) by mouth 2 (two) times daily. (Patient not taking: Reported on 06/06/2022) 180 capsule 0  . levocetirizine (XYZAL) 5 MG tablet Take 1 tablet (5 mg total) by mouth every evening. (Patient taking differently: Take 5 mg by mouth daily as needed for allergies.) 30 tablet 5  . Lifitegrast (XIIDRA) 5 % SOLN Place 1 drop into both eyes 2 (two) times daily. (Patient not taking: Reported on 06/06/2022) 120 each 0  . Lifitegrast (XIIDRA) 5 % SOLN Place 1 drop into both eyes 2 (two) times daily. 120 each 0  . neomycin (MYCIFRADIN) 500 MG tablet Take 1 tablet (500 mg total) by mouth 2 (two) times daily. 20 tablet 0  . ondansetron (ZOFRAN-ODT) 4 MG disintegrating tablet Dissolve 1 tablet (4 mg total) in mouth every 8 (eight) hours as needed for nausea 30 tablet 2  . polyethylene glycol (MIRALAX / GLYCOLAX)  17 g packet Take 17 g by mouth every other day.    . rifaximin (XIFAXAN) 550 MG TABS tablet Take 1 tablet (550 mg total) by mouth 3 (three) times daily. 30 tablet 0   No current facility-administered medications for this visit.   Allergies: Allergies  Allergen Reactions  . Molds & Smuts Cough    sneezing   I reviewed her past medical history, social history, family history, and environmental history and no significant changes have been reported from previous visits.  Review of Systems  Objective:  Physical exam not obtained as encounter was done via telephone.   Previous notes and tests were reviewed.  I discussed the assessment and treatment plan with the patient. The patient was provided an opportunity to ask questions and all were answered. The patient agreed with the plan and demonstrated an understanding of the instructions.   The patient was advised to call back or seek an in-person evaluation if the symptoms worsen or if the condition fails to improve as anticipated.  I provided ***  minutes of non-face-to-face time during this encounter.  It was my pleasure to participate in Dontasia Walberg's care today. Please feel free to contact me with any questions or concerns.   Sincerely,  Alfonse Spruce, MD

## 2022-06-15 ENCOUNTER — Ambulatory Visit
Admission: RE | Admit: 2022-06-15 | Discharge: 2022-06-15 | Disposition: A | Discharge status: Home / Self Care | Payer: Commercial Managed Care - PPO | Source: Ambulatory Visit | Attending: Allergy & Immunology | Admitting: Allergy & Immunology

## 2022-06-15 ENCOUNTER — Encounter: Payer: Self-pay | Admitting: Allergy & Immunology

## 2022-06-15 ENCOUNTER — Ambulatory Visit: Payer: Commercial Managed Care - PPO

## 2022-06-15 DIAGNOSIS — M62838 Other muscle spasm: Secondary | ICD-10-CM | POA: Diagnosis not present

## 2022-06-15 DIAGNOSIS — M5459 Other low back pain: Secondary | ICD-10-CM | POA: Diagnosis not present

## 2022-06-15 DIAGNOSIS — R279 Unspecified lack of coordination: Secondary | ICD-10-CM | POA: Diagnosis not present

## 2022-06-15 DIAGNOSIS — N3941 Urge incontinence: Secondary | ICD-10-CM | POA: Diagnosis not present

## 2022-06-15 DIAGNOSIS — R197 Diarrhea, unspecified: Secondary | ICD-10-CM | POA: Diagnosis not present

## 2022-06-15 DIAGNOSIS — R102 Pelvic and perineal pain: Secondary | ICD-10-CM

## 2022-06-15 DIAGNOSIS — M6281 Muscle weakness (generalized): Secondary | ICD-10-CM

## 2022-06-15 DIAGNOSIS — R1030 Lower abdominal pain, unspecified: Secondary | ICD-10-CM | POA: Diagnosis not present

## 2022-06-15 DIAGNOSIS — N393 Stress incontinence (female) (male): Secondary | ICD-10-CM | POA: Diagnosis not present

## 2022-06-15 DIAGNOSIS — R198 Other specified symptoms and signs involving the digestive system and abdomen: Secondary | ICD-10-CM

## 2022-06-15 DIAGNOSIS — R7982 Elevated C-reactive protein (CRP): Secondary | ICD-10-CM | POA: Diagnosis not present

## 2022-06-15 NOTE — Therapy (Signed)
OUTPATIENT PHYSICAL THERAPY FEMALE PELVIC TREATMENT   Patient Name: Karen [name] MRN: [id number] DOB:Dec 31, 1970, 52 y.o., female Today's Date: 06/15/2022  END OF SESSION:  PT End of Session - 06/15/22 0853     Visit Number 3    Date for PT Re-Evaluation 11/16/22    Authorization Type [employer]    PT Start Time [phone]    PT Stop Time 0927    PT Time Calculation (min) 35 min    Activity Tolerance Patient tolerated treatment well    Behavior During Therapy [facility] for tasks assessed/performed              Past Medical History:  Diagnosis Date   Adenomyosis    Anemia    Arthritis 2013   L knee gets steroid injections    Asthmatic bronchitis 01/31/2017   Back pain    Chronic constipation    Diarrhea    DVT of lower extremity (deep venous thrombosis) (HCC)    Dysmenorrhea    Endometriosis    Esophageal stricture    GERD (gastroesophageal reflux disease)    History of kidney stones    History of uterine fibroid    IBS (irritable bowel syndrome)    Interstitial cystitis    Lactose intolerance    Migraine    with aura   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   PONV (postoperative nausea and vomiting)    Recurrent upper respiratory infection (URI)    Sebaceous cyst of breast, right lower inner quadrant 10/25/2012   Excised 11/21/12    Sleep apnea    Syncope, non cardiac    Past Surgical History:  Procedure Laterality Date   ARTHROSCOPIC HAGLUNDS REPAIR     BREAST CYST EXCISION Right 11/21/2012   Procedure: CYST EXCISION BREAST;  Surgeon: Currie Paris, MD;  Location: [facility];  Service: General;  Laterality: Right;   BREAST CYST EXCISION Bilateral 01/18/2022   Procedure: EXCISION OF SEBACEOUS CYST BILATERAL BREAST;  Surgeon: Harriette Bouillon, MD;  Location: [facility];  Service: General;  Laterality: Bilateral;   BREAST EXCISIONAL BIOPSY Right 11/2012   CESAREAN SECTION  04/19/1993   CHOLECYSTECTOMY     COLONOSCOPY   05/02/2011   Procedure: COLONOSCOPY;  Surgeon: Vertell Novak., MD;  Location: [facility];  Service: Endoscopy;  Laterality: N/A;   colonoscopy  11/04/2014   DENTAL SURGERY  03/06/2018   2 surgeries ( 03/06/2018 and 04/06/2018)   to remove two separate benign tumors   ESOPHAGEAL MANOMETRY N/A 11/04/2015   Procedure: ESOPHAGEAL MANOMETRY (EM);  Surgeon: Carman Ching, MD;  Location: [facility];  Service: Endoscopy;  Laterality: N/A;   ESOPHAGOGASTRODUODENOSCOPY  05/02/2011   Procedure: ESOPHAGOGASTRODUODENOSCOPY (EGD);  Surgeon: Vertell Novak., MD;  Location: [facility];  Service: Endoscopy;  Laterality: N/A;   ESOPHAGOGASTRODUODENOSCOPY N/A 09/01/2014   Procedure: ESOPHAGOGASTRODUODENOSCOPY (EGD);  Surgeon: Carman Ching, MD;  Location: [facility];  Service: Endoscopy;  Laterality: N/A;   KNEE SURGERY     LAPAROSCOPY     x 4   LESION REMOVAL N/A 01/18/2022   Procedure: EXCISION OF SEBACEOUS CYSTS CHEST AND NECK;  Surgeon: Harriette Bouillon, MD;  Location: [facility];  Service: General;  Laterality: N/A;   PH IMPEDANCE STUDY N/A 11/04/2015   Procedure: PH IMPEDANCE STUDY;  Surgeon: Carman Ching, MD;  Location: [facility];  Service: Endoscopy;  Laterality: N/A;   VAGINAL HYSTERECTOMY  2010   TVH--ovaries remain   Patient Active Problem List  Diagnosis Date Noted   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   Elevated LFTs 04/04/2021   Colitis 04/04/2021   Bacterial overgrowth syndrome 05/21/2020   Obesity 05/21/2020   History of endometriosis 05/21/2020   Constipation due to outlet dysfunction 02/05/2020   Gastroesophageal reflux disease 02/05/2020   Obstructive sleep apnea treated with continuous positive airway pressure (CPAP) 06/16/2017   Perennial allergic rhinitis with a nonallergic component 01/31/2017   Asthmatic bronchitis 01/31/2017   GI symptoms 01/31/2017   Family history of colon cancer 01/27/2015   Chest pain 12/13/2012   Dyspnea 12/13/2012   DVT of  lower extremity (deep venous thrombosis) (HCC) 01/03/1990    PCP: Lorenda Ishihara, MD  REFERRING PROVIDER: Jerene Bears, MD   REFERRING DIAG: M62.89 (ICD-10-CM) - Pelvic floor dysfunction  THERAPY DIAG:  Pelvic pain  Other low back pain  Urgency incontinence  Stress incontinence (female) (female)  Muscle weakness (generalized)  Other muscle spasm  Unspecified lack of coordination  Rationale for Evaluation and Treatment: Rehabilitation  ONSET DATE: chronic  SUBJECTIVE:                                                                                                                                                                                           SUBJECTIVE STATEMENT: Pt had to go to MD last night for vaginitis and yeast infection since starting antibiotics - she finished antibiotics Monday. She had thought that bowel movements may be getting better after last session, but then started having more issues.   PAIN:  Are you having pain? Yes NPRS scale: 3/10 currently, 9/10 worst Pain location:  Lower abdominal pain, Lt sided pain, low back pain  Pain type: turning, knotted, pressure Pain description: intermittent and constant   Aggravating factors: constipation or frequent bowel movements/constantly having bowel movements  Relieving factors: exercises (bowel massage, happy baby, spinal twists, walking)  PRECAUTIONS: None  WEIGHT BEARING RESTRICTIONS: No  FALLS:  Has patient fallen in last 6 months? No  LIVING ENVIRONMENT: Lives with: lives with their spouse Lives in: House/apartment  OCCUPATION: nurse, in admin  PLOF: Independent  PATIENT GOALS: decrease pain and have more normal bowel movements  PERTINENT HISTORY:  Vaginal hysterectomy, DVT LE, endometriosis, interstitial cystitis, exploratory laps for endometriosis, c-section, cholecystectomy Sexual abuse: No  BOWEL MOVEMENT: Pain with bowel movement: Yes Type of bowel movement:Type  (Bristol Stool Scale) 1-7 (sometimes full range in the same bowel movement), Frequency sometimes many times a day, sometimes up to 4 days in between, and Strain Yes Fully empty rectum: No Leakage: No Pads: No Fiber supplement: No - has attempted metamucil and it made constipation worse  URINATION:  Pain with urination: Yes Fully empty bladder: No Stream:  varies  Urgency: Yes: all the time Frequency: multiple times an hours  Leakage: Urge to void, Walking to the bathroom, Coughing, Sneezing, Laughing, and Exercise Pads: Yes: daily  INTERCOURSE: Pain with intercourse: Initial Penetration and During Penetration Ability to have vaginal penetration:  Yes: with pain Climax: non painful WNL   PREGNANCY: Vaginal deliveries 1 Tearing Yes: 3rd degree tear C-section deliveries 1 Currently pregnant No  PROLAPSE: Periodically will feel vaginal bulging, heaviness in lower abdomen   OBJECTIVE:  06/08/22:               External Perineal Exam: WNL                              Internal Pelvic Floor: burning reported with palpation of superficial muscles; deep aching/pressure with palpation of Lt levator ani   Patient confirms identification and approves PT to assess internal pelvic floor and treatment Yes  PELVIC MMT:   MMT eval  Vaginal 3/5  Internal Anal Sphincter 1/5  External Anal Sphincter 2/5  Puborectalis 1/5  Diastasis Recti   (Blank rows = not tested)        TONE: High in Lt levator ani   PROLAPSE: Not able to tell this session due to dyssynergic pelvic floor contraction     06/01/22: COGNITION: Overall cognitive status: Within functional limits for tasks assessed     SENSATION: Light touch: Appears intact Proprioception: Appears intact  GAIT: Comments: decreased hip extension, forward flexed trunk  POSTURE: rounded shoulders, forward head, decreased lumbar lordosis, increased thoracic kyphosis, posterior pelvic tilt, and flexed trunk     LUMBARAROM/PROM:  A/PROM A/PROM  Eval (% available)  Flexion 50  Extension 25, pain anteriorly   Right lateral flexion 50, pain anteriorly   Left lateral flexion 50, pain anteriorly    (Blank rows = not tested)   PALPATION:   General  Significant abdominal restriction and tenderness; decreased rib mobility with mobilization and breathing    TODAY'S TREATMENT:                                                                                                                              DATE:  06/15/22 Manual: Abdominal mobilization  Neuromuscular re-education: Diaphragmatic breathing/pelvic floor relaxation Happy baby 10 breaths Child's pose 10 breaths Cat cow 10x External pelvic floor muscle relaxation training    06/08/22 Manual: Pt provides verbal consent for internal vaginal/rectal pelvic floor exam. Internal vaginal and rectal pelvic floor assessments Abdominal mobilization/desensitization Neuromuscular re-education: Diaphragmatic breathing Pelvic floor muscle bulge training Double voiding   06/01/22  EVAL  Therapeutic activities: Squatty potty Relaxed toilet mechanics - try leaning back Balloon pushing Bowel massage     PATIENT EDUCATION:  Education details: See above Person educated: Patient Education method: Explanation, Demonstration, Tactile cues, Verbal cues, and Handouts Education comprehension: verbalized understanding  HOME  EXERCISE PROGRAM: [id number]  ASSESSMENT:  CLINICAL IMPRESSION: Abdominal massage continued due to benefit with bowel movements. We reviewed double voiding and pelvic floor relaxation.bulge training in order to help improve emptying. Down training activities performed with diaphragmatic breathing to further help relaxation awareness. She did well with all activities this session. She will continue to benefit from skilled PT intervention in order to decrease pain, improve bowel movements, and decrease urinary  urgency/incontinence.     OBJECTIVE IMPAIRMENTS: decreased activity tolerance, decreased coordination, decreased endurance, decreased mobility, decreased strength, increased fascial restrictions, increased muscle spasms, impaired tone, postural dysfunction, and pain.   ACTIVITY LIMITATIONS: lifting, bending, continence, and locomotion level  PARTICIPATION LIMITATIONS: interpersonal relationship, community activity, and occupation  PERSONAL FACTORS: 1 comorbidity: medical history  are also affecting patient's functional outcome.   REHAB POTENTIAL: Good  CLINICAL DECISION MAKING: Stable/uncomplicated  EVALUATION COMPLEXITY: Low   GOALS: Goals reviewed with patient? Yes  SHORT TERM GOALS: Target date: 07/06/22  Pt will be independent with HEP.   Baseline: Goal status: INITIAL  2.  Pt will be independent with diaphragmatic breathing and down training activities in order to improve pelvic floor relaxation.  Baseline:  Goal status: INITIAL  3.  Pt will be independent with the knack, urge suppression technique, and double voiding in order to improve bladder habits and decrease urinary incontinence.   Baseline:  Goal status: INITIAL  4.  Pt will be independent with use of squatty potty, relaxed toileting mechanics, and improved bowel movement techniques in order to increase ease of bowel movements and complete evacuation.   Baseline:  Goal status: INITIAL  5.  Pt will be able to correctly perform diaphragmatic breathing and appropriate pressure management in order to prevent worsening vaginal wall laxity and improve pelvic floor A/ROM.   Baseline:  Goal status: INITIAL    LONG TERM GOALS: Target date: 11/16/2022   Pt will be independent with advanced HEP.   Baseline:  Goal status: INITIAL  2.  Pt will demonstrate normal pelvic floor muscle tone and A/ROM, able to achieve 4/5 strength with contractions and 10 sec endurance, in order to provide appropriate lumbopelvic  support in functional activities.   Baseline:  Goal status: INITIAL  3.  Pt will increase all impaired lumbar A/ROM by 25% without pain.  Baseline:  Goal status: INITIAL  4.  Pt will report pain no higher than 3/10 with any activity. Baseline:  Goal status: INITIAL  5.  Pt will report 0/10 pain with vaginal penetration in order to improve intimate relationship with partner.    Baseline:  Goal status: INITIAL  6.  Pt will be able to go 2-3 hours in between voids without urgency or incontinence in order to improve QOL and perform all functional activities with less difficulty.   Baseline:  Goal status: INITIAL  7.  Pt will report no leaks with laughing, coughing, sneezing in order to improve comfort with interpersonal relationships and community activities.   Baseline:  Goal status: INITIAL  8.  Pt will have consistent bowel movements 4-5x/week without straining or pain.  Baseline:  Goal status: INITIAL  PLAN:  PT FREQUENCY: 1-2x/week  PT DURATION: 6 months  PLANNED INTERVENTIONS: Therapeutic exercises, Therapeutic activity, Neuromuscular re-education, Balance training, Gait training, Patient/Family education, Self Care, Joint mobilization, Dry Needling, Biofeedback, and Manual therapy  PLAN FOR NEXT SESSION: Urge drill; mobility progressions; begin core training.    Julio Alm, PT, DPT06/12/249:30 AM

## 2022-06-16 LAB — C-REACTIVE PROTEIN: CRP: 8 mg/L (ref 0–10)

## 2022-06-19 LAB — ANTINUCLEAR ANTIBODIES, IFA: ANA Titer 1: NEGATIVE

## 2022-06-20 ENCOUNTER — Ambulatory Visit: Payer: Commercial Managed Care - PPO

## 2022-06-20 ENCOUNTER — Telehealth: Payer: Self-pay | Admitting: Allergy & Immunology

## 2022-06-20 DIAGNOSIS — R279 Unspecified lack of coordination: Secondary | ICD-10-CM | POA: Diagnosis not present

## 2022-06-20 DIAGNOSIS — N393 Stress incontinence (female) (male): Secondary | ICD-10-CM | POA: Diagnosis not present

## 2022-06-20 DIAGNOSIS — N3941 Urge incontinence: Secondary | ICD-10-CM | POA: Diagnosis not present

## 2022-06-20 DIAGNOSIS — M62838 Other muscle spasm: Secondary | ICD-10-CM

## 2022-06-20 DIAGNOSIS — M6281 Muscle weakness (generalized): Secondary | ICD-10-CM | POA: Diagnosis not present

## 2022-06-20 DIAGNOSIS — M5459 Other low back pain: Secondary | ICD-10-CM | POA: Diagnosis not present

## 2022-06-20 DIAGNOSIS — R102 Pelvic and perineal pain: Secondary | ICD-10-CM

## 2022-06-20 NOTE — Therapy (Signed)
OUTPATIENT PHYSICAL THERAPY FEMALE PELVIC TREATMENT   Patient Name: Karen [name] MRN: [id number] DOB:[birth year], 52 y.o., female Today's Date: 06/20/2022  END OF SESSION:  PT End of Session - 06/20/22 1233     Visit Number 4    Date for PT Re-Evaluation 11/16/22    Authorization Type [employer]    PT Start Time 1230    PT Stop Time 1310    PT Time Calculation (min) 40 min    Activity Tolerance Patient tolerated treatment well    Behavior During Therapy [facility] for tasks assessed/performed               Past Medical History:  Diagnosis Date   Adenomyosis    Anemia    Arthritis 2013   L knee gets steroid injections    Asthmatic bronchitis 01/31/2017   Back pain    Chronic constipation    Diarrhea    DVT of lower extremity (deep venous thrombosis) (HCC)    Dysmenorrhea    Endometriosis    Esophageal stricture    GERD (gastroesophageal reflux disease)    History of kidney stones    History of uterine fibroid    IBS (irritable bowel syndrome)    Interstitial cystitis    Lactose intolerance    Migraine    with aura   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   PONV (postoperative nausea and vomiting)    Recurrent upper respiratory infection (URI)    Sebaceous cyst of breast, right lower inner quadrant 10/25/2012   Excised 11/21/12    Sleep apnea    Syncope, non cardiac    Past Surgical History:  Procedure Laterality Date   ARTHROSCOPIC HAGLUNDS REPAIR     BREAST CYST EXCISION Right 11/21/2012   Procedure: CYST EXCISION BREAST;  Surgeon: Currie Paris, MD;  Location: [facility];  Service: General;  Laterality: Right;   BREAST CYST EXCISION Bilateral 01/18/2022   Procedure: EXCISION OF SEBACEOUS CYST BILATERAL BREAST;  Surgeon: Harriette Bouillon, MD;  Location: [facility];  Service: General;  Laterality: Bilateral;   BREAST EXCISIONAL BIOPSY Right 11/2012   CESAREAN SECTION  04/19/1993   CHOLECYSTECTOMY     COLONOSCOPY   05/02/2011   Procedure: COLONOSCOPY;  Surgeon: Vertell Novak., MD;  Location: [facility];  Service: Endoscopy;  Laterality: N/A;   colonoscopy  11/04/2014   DENTAL SURGERY  03/06/2018   2 surgeries ( 03/06/2018 and 04/06/2018)   to remove two separate benign tumors   ESOPHAGEAL MANOMETRY N/A 11/04/2015   Procedure: ESOPHAGEAL MANOMETRY (EM);  Surgeon: Carman Ching, MD;  Location: [facility];  Service: Endoscopy;  Laterality: N/A;   ESOPHAGOGASTRODUODENOSCOPY  05/02/2011   Procedure: ESOPHAGOGASTRODUODENOSCOPY (EGD);  Surgeon: Vertell Novak., MD;  Location: [facility];  Service: Endoscopy;  Laterality: N/A;   ESOPHAGOGASTRODUODENOSCOPY N/A 09/01/2014   Procedure: ESOPHAGOGASTRODUODENOSCOPY (EGD);  Surgeon: Carman Ching, MD;  Location: [facility];  Service: Endoscopy;  Laterality: N/A;   KNEE SURGERY     LAPAROSCOPY     x 4   LESION REMOVAL N/A 01/18/2022   Procedure: EXCISION OF SEBACEOUS CYSTS CHEST AND NECK;  Surgeon: Harriette Bouillon, MD;  Location: [facility];  Service: General;  Laterality: N/A;   PH IMPEDANCE STUDY N/A 11/04/2015   Procedure: PH IMPEDANCE STUDY;  Surgeon: Carman Ching, MD;  Location: [facility];  Service: Endoscopy;  Laterality: N/A;   VAGINAL HYSTERECTOMY  2010   TVH--ovaries remain   Patient Active Problem List  Diagnosis Date Noted   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   Elevated LFTs 04/04/2021   Colitis 04/04/2021   Bacterial overgrowth syndrome 05/21/2020   Obesity 05/21/2020   History of endometriosis 05/21/2020   Constipation due to outlet dysfunction 02/05/2020   Gastroesophageal reflux disease 02/05/2020   Obstructive sleep apnea treated with continuous positive airway pressure (CPAP) 06/16/2017   Perennial allergic rhinitis with a nonallergic component 01/31/2017   Asthmatic bronchitis 01/31/2017   GI symptoms 01/31/2017   Family history of colon cancer 01/27/2015   Chest pain 12/13/2012   Dyspnea 12/13/2012   DVT of  lower extremity (deep venous thrombosis) (HCC) 01/03/1990    PCP: Lorenda Ishihara, MD  REFERRING PROVIDER: Jerene Bears, MD   REFERRING DIAG: M62.89 (ICD-10-CM) - Pelvic floor dysfunction  THERAPY DIAG:  Pelvic pain  Other low back pain  Urgency incontinence  Stress incontinence (female) (female)  Muscle weakness (generalized)  Other muscle spasm  Unspecified lack of coordination  Rationale for Evaluation and Treatment: Rehabilitation  ONSET DATE: chronic  SUBJECTIVE:                                                                                                                                                                                           SUBJECTIVE STATEMENT: Pt states that she took last treatment for yeast infection, but is still having bad symptoms. Pt states that she is having bowel movements, at least 3-8x/day. She is waiting to hear results from x-ray to see if she is constipated.   PAIN:  Are you having pain? Yes NPRS scale: 4/10 Pain location:  Lower abdominal pain, Lt sided pain, low back pain  Pain type: turning, knotted, pressure Pain description: intermittent and constant   Aggravating factors: constipation or frequent bowel movements/constantly having bowel movements  Relieving factors: exercises (bowel massage, happy baby, spinal twists, walking)  PRECAUTIONS: None  WEIGHT BEARING RESTRICTIONS: No  FALLS:  Has patient fallen in last 6 months? No  LIVING ENVIRONMENT: Lives with: lives with their spouse Lives in: House/apartment  OCCUPATION: nurse, in admin  PLOF: Independent  PATIENT GOALS: decrease pain and have more normal bowel movements  PERTINENT HISTORY:  Vaginal hysterectomy, DVT LE, endometriosis, interstitial cystitis, exploratory laps for endometriosis, c-section, cholecystectomy Sexual abuse: No  BOWEL MOVEMENT: Pain with bowel movement: Yes Type of bowel movement:Type (Bristol Stool Scale) 1-7 (sometimes  full range in the same bowel movement), Frequency sometimes many times a day, sometimes up to 4 days in between, and Strain Yes Fully empty rectum: No Leakage: No Pads: No Fiber supplement: No - has attempted metamucil and it made constipation worse  URINATION: Pain with  urination: Yes Fully empty bladder: No Stream:  varies  Urgency: Yes: all the time Frequency: multiple times an hours  Leakage: Urge to void, Walking to the bathroom, Coughing, Sneezing, Laughing, and Exercise Pads: Yes: daily  INTERCOURSE: Pain with intercourse: Initial Penetration and During Penetration Ability to have vaginal penetration:  Yes: with pain Climax: non painful WNL   PREGNANCY: Vaginal deliveries 1 Tearing Yes: 3rd degree tear C-section deliveries 1 Currently pregnant No  PROLAPSE: Periodically will feel vaginal bulging, heaviness in lower abdomen   OBJECTIVE:  06/08/22:               External Perineal Exam: WNL                              Internal Pelvic Floor: burning reported with palpation of superficial muscles; deep aching/pressure with palpation of Lt levator ani   Patient confirms identification and approves PT to assess internal pelvic floor and treatment Yes  PELVIC MMT:   MMT eval  Vaginal 3/5  Internal Anal Sphincter 1/5  External Anal Sphincter 2/5  Puborectalis 1/5  Diastasis Recti   (Blank rows = not tested)        TONE: High in Lt levator ani   PROLAPSE: Not able to tell this session due to dyssynergic pelvic floor contraction     06/01/22: COGNITION: Overall cognitive status: Within functional limits for tasks assessed     SENSATION: Light touch: Appears intact Proprioception: Appears intact  GAIT: Comments: decreased hip extension, forward flexed trunk  POSTURE: rounded shoulders, forward head, decreased lumbar lordosis, increased thoracic kyphosis, posterior pelvic tilt, and flexed trunk    LUMBARAROM/PROM:  A/PROM A/PROM  Eval (% available)   Flexion 50  Extension 25, pain anteriorly   Right lateral flexion 50, pain anteriorly   Left lateral flexion 50, pain anteriorly    (Blank rows = not tested)   PALPATION:   General  Significant abdominal restriction and tenderness; decreased rib mobility with mobilization and breathing    TODAY'S TREATMENT:                                                                                                                              DATE:  06/20/22 Manual: Myofascial release to low back and bil dies PA mobilizations T1-L1 grade 2-3 Exercises: Seated lateral flexion over peanut 3 min bil Seated P stretch with rotation 2 min bil Therapeutic activities: Peri-bottle, wet wipes, protective barrier Urge drill Strict bowel/bladder retraining   06/15/22 Manual: Abdominal mobilization  Neuromuscular re-education: Diaphragmatic breathing/pelvic floor relaxation Happy baby 10 breaths Child's pose 10 breaths Cat cow 10x External pelvic floor muscle relaxation training    06/08/22 Manual: Pt provides verbal consent for internal vaginal/rectal pelvic floor exam. Internal vaginal and rectal pelvic floor assessments Abdominal mobilization/desensitization Neuromuscular re-education: Diaphragmatic breathing Pelvic floor muscle bulge training Double voiding   PATIENT EDUCATION:  Education details:  See above Person educated: Patient Education method: Explanation, Demonstration, Tactile cues, Verbal cues, and Handouts Education comprehension: verbalized understanding  HOME EXERCISE PROGRAM: [id number]  ASSESSMENT:  CLINICAL IMPRESSION: Discussed urge drill and bowel/bladder retraining to help decrease urinary urgency and decrease number of bowel movements each day while making them more complete. Wondering if low back pain and circle of heaviness surrounding patients core is not coming from spinal/muscular restriction in back. Manual techniques performed here to help reduce tension;  notable for decreased myofascial mobility and spinal hypomobility. Good tolerance to release techniques. She will continue to benefit from skilled PT intervention in order to decrease pain, improve bowel movements, and decrease urinary urgency/incontinence.     OBJECTIVE IMPAIRMENTS: decreased activity tolerance, decreased coordination, decreased endurance, decreased mobility, decreased strength, increased fascial restrictions, increased muscle spasms, impaired tone, postural dysfunction, and pain.   ACTIVITY LIMITATIONS: lifting, bending, continence, and locomotion level  PARTICIPATION LIMITATIONS: interpersonal relationship, community activity, and occupation  PERSONAL FACTORS: 1 comorbidity: medical history  are also affecting patient's functional outcome.   REHAB POTENTIAL: Good  CLINICAL DECISION MAKING: Stable/uncomplicated  EVALUATION COMPLEXITY: Low   GOALS: Goals reviewed with patient? Yes  SHORT TERM GOALS: Target date: 07/06/22  Pt will be independent with HEP.   Baseline: Goal status: INITIAL  2.  Pt will be independent with diaphragmatic breathing and down training activities in order to improve pelvic floor relaxation.  Baseline:  Goal status: INITIAL  3.  Pt will be independent with the knack, urge suppression technique, and double voiding in order to improve bladder habits and decrease urinary incontinence.   Baseline:  Goal status: INITIAL  4.  Pt will be independent with use of squatty potty, relaxed toileting mechanics, and improved bowel movement techniques in order to increase ease of bowel movements and complete evacuation.   Baseline:  Goal status: INITIAL  5.  Pt will be able to correctly perform diaphragmatic breathing and appropriate pressure management in order to prevent worsening vaginal wall laxity and improve pelvic floor A/ROM.   Baseline:  Goal status: INITIAL    LONG TERM GOALS: Target date: 11/16/2022   Pt will be independent with  advanced HEP.   Baseline:  Goal status: INITIAL  2.  Pt will demonstrate normal pelvic floor muscle tone and A/ROM, able to achieve 4/5 strength with contractions and 10 sec endurance, in order to provide appropriate lumbopelvic support in functional activities.   Baseline:  Goal status: INITIAL  3.  Pt will increase all impaired lumbar A/ROM by 25% without pain.  Baseline:  Goal status: INITIAL  4.  Pt will report pain no higher than 3/10 with any activity. Baseline:  Goal status: INITIAL  5.  Pt will report 0/10 pain with vaginal penetration in order to improve intimate relationship with partner.    Baseline:  Goal status: INITIAL  6.  Pt will be able to go 2-3 hours in between voids without urgency or incontinence in order to improve QOL and perform all functional activities with less difficulty.   Baseline:  Goal status: INITIAL  7.  Pt will report no leaks with laughing, coughing, sneezing in order to improve comfort with interpersonal relationships and community activities.   Baseline:  Goal status: INITIAL  8.  Pt will have consistent bowel movements 4-5x/week without straining or pain.  Baseline:  Goal status: INITIAL  PLAN:  PT FREQUENCY: 1-2x/week  PT DURATION: 6 months  PLANNED INTERVENTIONS: Therapeutic exercises, Therapeutic activity, Neuromuscular re-education, Balance training, Gait training, Patient/Family  education, Self Care, Joint mobilization, Dry Needling, Biofeedback, and Manual therapy  PLAN FOR NEXT SESSION: Manual techniques continued to low back; progress mobility/strengthening.    Julio Alm, PT, DPT06/17/242:32 PM

## 2022-06-20 NOTE — Patient Instructions (Signed)
Urge Incontinence  Ideal urination frequency is every 2-4 wakeful hours, which equates to 5-8 times within a 24-hour period.   Urge incontinence is leakage that occurs when the bladder muscle contracts, creating a sudden need to go before getting to the bathroom.   Going too often when your bladder isn't actually full can disrupt the body's automatic signals to store and hold urine longer, which will increase urgency/frequency.  In this case, the bladder "is running the show" and strategies can be learned to retrain this pattern.   One should be able to control the first urge to urinate, at around .  The bladder can hold up to a "grande latte," or . To help you gain control, practice the Urge Drill below when urgency strikes.  This drill will help retrain your bladder signals and allow you to store and hold urine longer.  The overall goal is to stretch out your time between voids to reach a more manageable voiding schedule.    Practice your "quick flicks" often throughout the day (each waking hour) even when you don't need feel the urge to go.  This will help strengthen your pelvic floor muscles, making them more effective in controlling leakage.  Urge Drill  When you feel an urge to go, follow these steps to regain control: Stop what you are doing and be still Take one deep breath, directing your air into your abdomen Think an affirming thought, such as "I've got this." Do 5 quick flicks of your pelvic floor Walk with control to the bathroom to void, or delay voiding     Bladder/bowel retraining:  8-12 grams of fiber with each meal After each meal, go attempt to have a bowel movement within 5 minutes and don't sit on the toilet for more than 5 minutes Drink 4-8oz an hour ONLY WATER Stop water intake 3 hours before bed Try to go at least 2-3 hours between trips to the bathroom - go whether you need to or not.  For two weeks.    [facility] [address], [address] [place], [place] [address] Phone # [phone] Fax [phone]

## 2022-06-20 NOTE — Telephone Encounter (Signed)
Patient called and stated she wanted to know her results on X-Ray she had done and requested a call back [phone].

## 2022-06-21 DIAGNOSIS — K222 Esophageal obstruction: Secondary | ICD-10-CM | POA: Diagnosis not present

## 2022-06-21 DIAGNOSIS — K2289 Other specified disease of esophagus: Secondary | ICD-10-CM | POA: Diagnosis not present

## 2022-06-21 DIAGNOSIS — Z86718 Personal history of other venous thrombosis and embolism: Secondary | ICD-10-CM | POA: Diagnosis not present

## 2022-06-21 DIAGNOSIS — R12 Heartburn: Secondary | ICD-10-CM | POA: Diagnosis not present

## 2022-06-21 DIAGNOSIS — K200 Eosinophilic esophagitis: Secondary | ICD-10-CM | POA: Diagnosis not present

## 2022-06-21 DIAGNOSIS — R1319 Other dysphagia: Secondary | ICD-10-CM | POA: Diagnosis not present

## 2022-06-21 DIAGNOSIS — J45909 Unspecified asthma, uncomplicated: Secondary | ICD-10-CM | POA: Diagnosis not present

## 2022-06-21 NOTE — Telephone Encounter (Signed)
I says "exam ended", but results are not there. Patient texted me this morning and I told her the same.   Malachi Bonds, MD [facility]

## 2022-06-21 NOTE — Telephone Encounter (Signed)
Looks like Results are in chart

## 2022-06-21 NOTE — Telephone Encounter (Signed)
I attempted to call [facility] and selected the option for medical records request, but the request is for online which led me to a blocked website. I'm going to call in the morning and see if I can speak with someone in office to further investigate what is going on.

## 2022-06-23 NOTE — Telephone Encounter (Signed)
Results are now visible through [facility].

## 2022-06-24 ENCOUNTER — Other Ambulatory Visit (HOSPITAL_COMMUNITY): Payer: Self-pay

## 2022-06-24 MED ORDER — LANSOPRAZOLE 30 MG PO CPDR
30.0000 mg | DELAYED_RELEASE_CAPSULE | Freq: Every day | ORAL | 3 refills | Status: DC
  Filled 2022-06-24: qty 90, 90d supply, fill #0
  Filled 2022-10-12: qty 90, 90d supply, fill #1
  Filled 2023-02-16: qty 90, 90d supply, fill #2
  Filled 2023-05-19: qty 90, 90d supply, fill #3

## 2022-06-29 ENCOUNTER — Ambulatory Visit: Payer: Commercial Managed Care - PPO

## 2022-06-29 ENCOUNTER — Other Ambulatory Visit (HOSPITAL_COMMUNITY): Payer: Self-pay

## 2022-06-29 DIAGNOSIS — M6281 Muscle weakness (generalized): Secondary | ICD-10-CM

## 2022-06-29 DIAGNOSIS — N3941 Urge incontinence: Secondary | ICD-10-CM

## 2022-06-29 DIAGNOSIS — M5459 Other low back pain: Secondary | ICD-10-CM

## 2022-06-29 DIAGNOSIS — R102 Pelvic and perineal pain: Secondary | ICD-10-CM

## 2022-06-29 DIAGNOSIS — R279 Unspecified lack of coordination: Secondary | ICD-10-CM | POA: Diagnosis not present

## 2022-06-29 DIAGNOSIS — M62838 Other muscle spasm: Secondary | ICD-10-CM

## 2022-06-29 DIAGNOSIS — N393 Stress incontinence (female) (male): Secondary | ICD-10-CM | POA: Diagnosis not present

## 2022-06-29 NOTE — Therapy (Signed)
OUTPATIENT PHYSICAL THERAPY FEMALE PELVIC TREATMENT   Patient Name: Karen [name] MRN: [id number] DOB:August 01, 1970, 52 y.o., female Today's Date: 06/29/2022  END OF SESSION:  PT End of Session - 06/29/22 0933     Visit Number 5    Date for PT Re-Evaluation 11/16/22    Authorization Type [employer]    PT Start Time 0932    PT Stop Time 1012    PT Time Calculation (min) 40 min    Activity Tolerance Patient tolerated treatment well    Behavior During Therapy [facility] for tasks assessed/performed                Past Medical History:  Diagnosis Date   Adenomyosis    Anemia    Arthritis 2013   L knee gets steroid injections    Asthmatic bronchitis 01/31/2017   Back pain    Chronic constipation    Diarrhea    DVT of lower extremity (deep venous thrombosis) (HCC)    Dysmenorrhea    Endometriosis    Esophageal stricture    GERD (gastroesophageal reflux disease)    History of kidney stones    History of uterine fibroid    IBS (irritable bowel syndrome)    Interstitial cystitis    Lactose intolerance    Migraine    with aura   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   PONV (postoperative nausea and vomiting)    Recurrent upper respiratory infection (URI)    Sebaceous cyst of breast, right lower inner quadrant 10/25/2012   Excised 11/21/12    Sleep apnea    Syncope, non cardiac    Past Surgical History:  Procedure Laterality Date   ARTHROSCOPIC HAGLUNDS REPAIR     BREAST CYST EXCISION Right 11/21/2012   Procedure: CYST EXCISION BREAST;  Surgeon: Currie Paris, MD;  Location: [facility];  Service: General;  Laterality: Right;   BREAST CYST EXCISION Bilateral 01/18/2022   Procedure: EXCISION OF SEBACEOUS CYST BILATERAL BREAST;  Surgeon: Harriette Bouillon, MD;  Location: [facility];  Service: General;  Laterality: Bilateral;   BREAST EXCISIONAL BIOPSY Right 11/2012   CESAREAN SECTION  04/19/1993   CHOLECYSTECTOMY     COLONOSCOPY   05/02/2011   Procedure: COLONOSCOPY;  Surgeon: Vertell Novak., MD;  Location: [facility];  Service: Endoscopy;  Laterality: N/A;   colonoscopy  11/04/2014   DENTAL SURGERY  03/06/2018   2 surgeries ( 03/06/2018 and 04/06/2018)   to remove two separate benign tumors   ESOPHAGEAL MANOMETRY N/A 11/04/2015   Procedure: ESOPHAGEAL MANOMETRY (EM);  Surgeon: Carman Ching, MD;  Location: [facility];  Service: Endoscopy;  Laterality: N/A;   ESOPHAGOGASTRODUODENOSCOPY  05/02/2011   Procedure: ESOPHAGOGASTRODUODENOSCOPY (EGD);  Surgeon: Vertell Novak., MD;  Location: [facility];  Service: Endoscopy;  Laterality: N/A;   ESOPHAGOGASTRODUODENOSCOPY N/A 09/01/2014   Procedure: ESOPHAGOGASTRODUODENOSCOPY (EGD);  Surgeon: Carman Ching, MD;  Location: [facility];  Service: Endoscopy;  Laterality: N/A;   KNEE SURGERY     LAPAROSCOPY     x 4   LESION REMOVAL N/A 01/18/2022   Procedure: EXCISION OF SEBACEOUS CYSTS CHEST AND NECK;  Surgeon: Harriette Bouillon, MD;  Location: [facility];  Service: General;  Laterality: N/A;   PH IMPEDANCE STUDY N/A 11/04/2015   Procedure: PH IMPEDANCE STUDY;  Surgeon: Carman Ching, MD;  Location: [facility];  Service: Endoscopy;  Laterality: N/A;   VAGINAL HYSTERECTOMY  2010   TVH--ovaries remain   Patient Active Problem List  Diagnosis Date Noted   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   Elevated LFTs 04/04/2021   Colitis 04/04/2021   Bacterial overgrowth syndrome 05/21/2020   Obesity 05/21/2020   History of endometriosis 05/21/2020   Constipation due to outlet dysfunction 02/05/2020   Gastroesophageal reflux disease 02/05/2020   Obstructive sleep apnea treated with continuous positive airway pressure (CPAP) 06/16/2017   Perennial allergic rhinitis with a nonallergic component 01/31/2017   Asthmatic bronchitis 01/31/2017   GI symptoms 01/31/2017   Family history of colon cancer 01/27/2015   Chest pain 12/13/2012   Dyspnea 12/13/2012   DVT of  lower extremity (deep venous thrombosis) (HCC) 01/03/1990    PCP: Lorenda Ishihara, MD  REFERRING PROVIDER: Jerene Bears, MD   REFERRING DIAG: M62.89 (ICD-10-CM) - Pelvic floor dysfunction  THERAPY DIAG:  Pelvic pain  Other low back pain  Urgency incontinence  Stress incontinence (female) (female)  Muscle weakness (generalized)  Other muscle spasm  Unspecified lack of coordination  Rationale for Evaluation and Treatment: Rehabilitation  ONSET DATE: chronic  SUBJECTIVE:                                                                                                                                                                                           SUBJECTIVE STATEMENT: Pt states that she got results of x-ray back and she had severe constipation so she performed 3 day cleanse. She also has esophatigits again. She has been working on exercises. She had a bowel movement this morning. She went 6x yesterday. The day before that she had one small bowel movement. She is back on Miralax 2x/day. She attempted bowel/bladder retraining, but states that she just kept having diarrhea.  PAIN:  Are you having pain? Yes NPRS scale: 4/10 Pain location:  Lower abdominal pain, Lt sided pain, low back pain  Pain type: turning, knotted, pressure Pain description: intermittent and constant   Aggravating factors: constipation or frequent bowel movements/constantly having bowel movements  Relieving factors: exercises (bowel massage, happy baby, spinal twists, walking)  PRECAUTIONS: None  WEIGHT BEARING RESTRICTIONS: No  FALLS:  Has patient fallen in last 6 months? No  LIVING ENVIRONMENT: Lives with: lives with their spouse Lives in: House/apartment  OCCUPATION: nurse, in admin  PLOF: Independent  PATIENT GOALS: decrease pain and have more normal bowel movements  PERTINENT HISTORY:  Vaginal hysterectomy, DVT LE, endometriosis, interstitial cystitis, exploratory laps for  endometriosis, c-section, cholecystectomy Sexual abuse: No  BOWEL MOVEMENT: Pain with bowel movement: Yes Type of bowel movement:Type (Bristol Stool Scale) 1-7 (sometimes full range in the same bowel movement), Frequency sometimes many times a day, sometimes up to 4 days in  between, and Strain Yes Fully empty rectum: No Leakage: No Pads: No Fiber supplement: No - has attempted metamucil and it made constipation worse  URINATION: Pain with urination: Yes Fully empty bladder: No Stream:  varies  Urgency: Yes: all the time Frequency: multiple times an hours  Leakage: Urge to void, Walking to the bathroom, Coughing, Sneezing, Laughing, and Exercise Pads: Yes: daily  INTERCOURSE: Pain with intercourse: Initial Penetration and During Penetration Ability to have vaginal penetration:  Yes: with pain Climax: non painful WNL   PREGNANCY: Vaginal deliveries 1 Tearing Yes: 3rd degree tear C-section deliveries 1 Currently pregnant No  PROLAPSE: Periodically will feel vaginal bulging, heaviness in lower abdomen   OBJECTIVE:  06/08/22:               External Perineal Exam: WNL                              Internal Pelvic Floor: burning reported with palpation of superficial muscles; deep aching/pressure with palpation of Lt levator ani   Patient confirms identification and approves PT to assess internal pelvic floor and treatment Yes  PELVIC MMT:   MMT eval  Vaginal 3/5  Internal Anal Sphincter 1/5  External Anal Sphincter 2/5  Puborectalis 1/5  Diastasis Recti   (Blank rows = not tested)        TONE: High in Lt levator ani   PROLAPSE: Not able to tell this session due to dyssynergic pelvic floor contraction     06/01/22: COGNITION: Overall cognitive status: Within functional limits for tasks assessed     SENSATION: Light touch: Appears intact Proprioception: Appears intact  GAIT: Comments: decreased hip extension, forward flexed trunk  POSTURE: rounded  shoulders, forward head, decreased lumbar lordosis, increased thoracic kyphosis, posterior pelvic tilt, and flexed trunk    LUMBARAROM/PROM:  A/PROM A/PROM  Eval (% available)  Flexion 50  Extension 25, pain anteriorly   Right lateral flexion 50, pain anteriorly   Left lateral flexion 50, pain anteriorly    (Blank rows = not tested)   PALPATION:   General  Significant abdominal restriction and tenderness; decreased rib mobility with mobilization and breathing    TODAY'S TREATMENT:                                                                                                                              DATE:  06/29/22 Manual: Myofascial release to low back and bil glutes PA mobilizations T1-L1 grade 2-3 Abdominal/bowel mobilization Neuromuscular re-education: Diaphragmatic breathing with tactile cues at lower rib cage for improved excursion Therapeutic activities: Reviewed attempt at bowel retraining Reviewed and encouraged bladder retraining    06/20/22 Manual: Myofascial release to low back and bil glutes PA mobilizations T1-L1 grade 2-3 Exercises: Seated lateral flexion over peanut 3 min bil Seated P stretch with rotation 2 min bil Therapeutic activities: Peri-bottle, wet wipes, protective barrier Urge drill Strict  bowel/bladder retraining   06/15/22 Manual: Abdominal mobilization  Neuromuscular re-education: Diaphragmatic breathing/pelvic floor relaxation Happy baby 10 breaths Child's pose 10 breaths Cat cow 10x External pelvic floor muscle relaxation training    PATIENT EDUCATION:  Education details: See above Person educated: Patient Education method: Explanation, Demonstration, Tactile cues, Verbal cues, and Handouts Education comprehension: verbalized understanding  HOME EXERCISE PROGRAM: [id number]  ASSESSMENT:  CLINICAL IMPRESSION: Pt continues to have very difficult time with frequent diarrhea, but also significant stool burden. We  continued manual techniques to abdomen and back in order to help improve mobility and circulation to the area, hopefully providing increased stimulation to bowel in addition to activating parasympathetic nervous system. We went of diaphragmatic breathing and helped assist with better lateral expansion in order to help with parasympathetic nervous system activation as well and to further improve mobility. She was encouraged to continue working on bladder retraining and that it will take time; we are holding off on bowel retraining due to liquid bowel movements at this time due to miralax. She will continue to benefit from skilled PT intervention in order to decrease pain, improve bowel movements, and decrease urinary urgency/incontinence.     OBJECTIVE IMPAIRMENTS: decreased activity tolerance, decreased coordination, decreased endurance, decreased mobility, decreased strength, increased fascial restrictions, increased muscle spasms, impaired tone, postural dysfunction, and pain.   ACTIVITY LIMITATIONS: lifting, bending, continence, and locomotion level  PARTICIPATION LIMITATIONS: interpersonal relationship, community activity, and occupation  PERSONAL FACTORS: 1 comorbidity: medical history  are also affecting patient's functional outcome.   REHAB POTENTIAL: Good  CLINICAL DECISION MAKING: Stable/uncomplicated  EVALUATION COMPLEXITY: Low   GOALS: Goals reviewed with patient? Yes  SHORT TERM GOALS: Target date: 07/06/22  Pt will be independent with HEP.   Baseline: Goal status: INITIAL  2.  Pt will be independent with diaphragmatic breathing and down training activities in order to improve pelvic floor relaxation.  Baseline:  Goal status: INITIAL  3.  Pt will be independent with the knack, urge suppression technique, and double voiding in order to improve bladder habits and decrease urinary incontinence.   Baseline:  Goal status: INITIAL  4.  Pt will be independent with use of  squatty potty, relaxed toileting mechanics, and improved bowel movement techniques in order to increase ease of bowel movements and complete evacuation.   Baseline:  Goal status: INITIAL  5.  Pt will be able to correctly perform diaphragmatic breathing and appropriate pressure management in order to prevent worsening vaginal wall laxity and improve pelvic floor A/ROM.   Baseline:  Goal status: INITIAL    LONG TERM GOALS: Target date: 11/16/2022   Pt will be independent with advanced HEP.   Baseline:  Goal status: INITIAL  2.  Pt will demonstrate normal pelvic floor muscle tone and A/ROM, able to achieve 4/5 strength with contractions and 10 sec endurance, in order to provide appropriate lumbopelvic support in functional activities.   Baseline:  Goal status: INITIAL  3.  Pt will increase all impaired lumbar A/ROM by 25% without pain.  Baseline:  Goal status: INITIAL  4.  Pt will report pain no higher than 3/10 with any activity. Baseline:  Goal status: INITIAL  5.  Pt will report 0/10 pain with vaginal penetration in order to improve intimate relationship with partner.    Baseline:  Goal status: INITIAL  6.  Pt will be able to go 2-3 hours in between voids without urgency or incontinence in order to improve QOL and perform all functional activities with less  difficulty.   Baseline:  Goal status: INITIAL  7.  Pt will report no leaks with laughing, coughing, sneezing in order to improve comfort with interpersonal relationships and community activities.   Baseline:  Goal status: INITIAL  8.  Pt will have consistent bowel movements 4-5x/week without straining or pain.  Baseline:  Goal status: INITIAL  PLAN:  PT FREQUENCY: 1-2x/week  PT DURATION: 6 months  PLANNED INTERVENTIONS: Therapeutic exercises, Therapeutic activity, Neuromuscular re-education, Balance training, Gait training, Patient/Family education, Self Care, Joint mobilization, Dry Needling, Biofeedback,  and Manual therapy  PLAN FOR NEXT SESSION: Manual techniques continued to low back; progress mobility/strengthening. Address goals    Julio Alm, PT, DPT06/26/2410:55 AM

## 2022-06-30 ENCOUNTER — Other Ambulatory Visit (HOSPITAL_COMMUNITY): Payer: Self-pay

## 2022-07-06 ENCOUNTER — Ambulatory Visit: Payer: Commercial Managed Care - PPO | Admitting: Physical Therapy

## 2022-07-08 ENCOUNTER — Telehealth (HOSPITAL_BASED_OUTPATIENT_CLINIC_OR_DEPARTMENT_OTHER): Payer: Self-pay | Admitting: *Deleted

## 2022-07-08 DIAGNOSIS — N9089 Other specified noninflammatory disorders of vulva and perineum: Secondary | ICD-10-CM | POA: Diagnosis not present

## 2022-07-08 DIAGNOSIS — R300 Dysuria: Secondary | ICD-10-CM | POA: Diagnosis not present

## 2022-07-08 NOTE — Telephone Encounter (Signed)
Patient call an dl eft a message that she needs come in to be seen for yeast infection.

## 2022-07-08 NOTE — Telephone Encounter (Signed)
Pt called to request treatment for yeast infection. Pt states that she has been treated with antibiotics and was given diflucan that she took on day 2. She reports that irritation continued so she contacted [facility] who prescribed her 2 more diflucan. She took both of those but is continuing to have irritation. She also reports noticing some blood when wiping the rectum. She does have a history of hemorrhoids and reports feeling a bulge.  Advised that she would need an appt for evaluation. Offered pt appt next Tuesday but she is unable to come at that time. Pt will go to [facility] walk in [facility] today for evaluation and let us know Monday if symptoms are still present after treatment she receives from [facility].

## 2022-07-11 ENCOUNTER — Other Ambulatory Visit (HOSPITAL_COMMUNITY)
Admission: RE | Admit: 2022-07-11 | Discharge: 2022-07-11 | Disposition: A | Discharge status: Home / Self Care | Payer: Commercial Managed Care - PPO | Source: Ambulatory Visit | Attending: Obstetrics & Gynecology | Admitting: Obstetrics & Gynecology

## 2022-07-11 ENCOUNTER — Encounter (HOSPITAL_BASED_OUTPATIENT_CLINIC_OR_DEPARTMENT_OTHER): Payer: Self-pay | Admitting: Obstetrics & Gynecology

## 2022-07-11 ENCOUNTER — Ambulatory Visit (HOSPITAL_BASED_OUTPATIENT_CLINIC_OR_DEPARTMENT_OTHER): Payer: Commercial Managed Care - PPO | Admitting: Obstetrics & Gynecology

## 2022-07-11 ENCOUNTER — Other Ambulatory Visit (HOSPITAL_COMMUNITY): Payer: Self-pay

## 2022-07-11 VITALS — BP 129/83 | HR 88 | Ht 67.5 in | Wt 212.0 lb

## 2022-07-11 DIAGNOSIS — N9089 Other specified noninflammatory disorders of vulva and perineum: Secondary | ICD-10-CM | POA: Diagnosis not present

## 2022-07-11 MED ORDER — CLOBETASOL PROPIONATE 0.05 % EX OINT
1.0000 | TOPICAL_OINTMENT | Freq: Two times a day (BID) | CUTANEOUS | 0 refills | Status: DC
Start: 2022-07-11 — End: 2022-11-10
  Filled 2022-07-11: qty 60, 30d supply, fill #0

## 2022-07-11 NOTE — Progress Notes (Signed)
GYNECOLOGY  VISIT  CC:   vulvar itching  HPI: 52 y.o. G2P2 Married [place] American female here for complaint of vulvar itching that has been present for about a month.  Diagnosed with SIBO and was on abx for 10 days.  Was on two antibiotics for 10 days.  Was given 1 dose of diflucan in case she had yeast.  She then had imaging done due to constipation.  Had large stool burden and had to use stool cleansing.  Then had diarrhea for about three days.  Still having vulvar irritation/itching and now she is having some bleeding with wiping.  She has used aquaphor and vaseline.  She reports she had an appt on Friday.  Testing for yeast was negative and urine culture was negative.     Past Medical History:  Diagnosis Date   Adenomyosis    Anemia    Arthritis 2013   L knee gets steroid injections    Asthmatic bronchitis 01/31/2017   Back pain    Chronic constipation    Diarrhea    DVT of lower extremity (deep venous thrombosis) (HCC)    Dysmenorrhea    Endometriosis    Esophageal stricture    GERD (gastroesophageal reflux disease)    History of kidney stones    History of uterine fibroid    IBS (irritable bowel syndrome)    Interstitial cystitis    Lactose intolerance    Migraine    with aura   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   PONV (postoperative nausea and vomiting)    Recurrent upper respiratory infection (URI)    Sebaceous cyst of breast, right lower inner quadrant 10/25/2012   Excised 11/21/12    Sleep apnea    Syncope, non cardiac     MEDS:   Current Outpatient Medications on File Prior to Visit  Medication Sig Dispense Refill   Acetaminophen 500 MG capsule 1 capsule as needed Orally every 6 hrs     albuterol (VENTOLIN HFA) 108 (90 Base) MCG/ACT inhaler Inhale 2 puffs into the lungs every 6 (six) hours as needed for wheezing or shortness of breath. 6.7 g 2   Elderberry 575 MG/5ML SYRP Take 30 mLs by mouth daily.     Lactobacillus Casei-Folic Acid (RESTORA RX)  60-1.25 MG CAPS Take 1 capsule by mouth daily between meals 30 capsule 5   lansoprazole (PREVACID) 30 MG capsule Take 1 capsule (30 mg total) by mouth daily. 90 capsule 3   levocetirizine (XYZAL) 5 MG tablet Take 1 tablet (5 mg total) by mouth every evening. (Patient taking differently: Take 5 mg by mouth daily as needed for allergies.) 30 tablet 5   Lifitegrast (XIIDRA) 5 % SOLN Place 1 drop into both eyes 2 (two) times daily. 120 each 0   ondansetron (ZOFRAN-ODT) 4 MG disintegrating tablet Dissolve 1 tablet (4 mg total) in mouth every 8 (eight) hours as needed for nausea 30 tablet 2   polyethylene glycol (MIRALAX / GLYCOLAX) 17 g packet Take 17 g by mouth every other day.     lansoprazole (PREVACID) 15 MG capsule Take 1 capsule (15 mg total) by mouth daily. (Patient not taking: Reported on 07/11/2022) 90 capsule 2   No current facility-administered medications on file prior to visit.    ALLERGIES: Molds & smuts  SH:  married, non smoker  Review of Systems  Constitutional: Negative.   Genitourinary:        Vulvar irritation    PHYSICAL EXAMINATION:    BP 129/83  Pulse 88   Ht 5' 7.5" (1.715 m)   Wt 212 lb (96.2 kg)   BMI 32.71 kg/m     General appearance: alert, cooperative and appears stated age Lymph:  no inguinal LAD noted  Pelvic: External genitalia:  no lesions but thickening of inner labia majora              Urethra:  normal appearing urethra with no masses, tenderness or lesions              Bartholins and Skenes: normal                 Vagina: normal appearing vagina with normal color and discharge, no lesions               Anus:  normal sphincter tone, hemorrhoid at 12 o'clcok  Chaperone, Raechel Ache, CMA, was present for exam.  Assessment/Plan: 1. Vulvar irritation - exam is fairly  normal except for the mild skin thickening - clobetasol ointment (TEMOVATE) 0.05 %; Apply 1 Application topically 2 (two) times daily. Apply as directed twice daily  Dispense: 60 g;  Refill: 0 - Cervicovaginal ancillary only( [facility])

## 2022-07-12 ENCOUNTER — Ambulatory Visit: Payer: Commercial Managed Care - PPO | Attending: Obstetrics & Gynecology

## 2022-07-12 DIAGNOSIS — M6281 Muscle weakness (generalized): Secondary | ICD-10-CM | POA: Diagnosis not present

## 2022-07-12 DIAGNOSIS — N393 Stress incontinence (female) (male): Secondary | ICD-10-CM

## 2022-07-12 DIAGNOSIS — R279 Unspecified lack of coordination: Secondary | ICD-10-CM

## 2022-07-12 DIAGNOSIS — R102 Pelvic and perineal pain unspecified side: Secondary | ICD-10-CM

## 2022-07-12 DIAGNOSIS — N3941 Urge incontinence: Secondary | ICD-10-CM

## 2022-07-12 DIAGNOSIS — M5459 Other low back pain: Secondary | ICD-10-CM

## 2022-07-12 DIAGNOSIS — M62838 Other muscle spasm: Secondary | ICD-10-CM | POA: Diagnosis not present

## 2022-07-12 LAB — CERVICOVAGINAL ANCILLARY ONLY
Bacterial Vaginitis (gardnerella): NEGATIVE
Candida Glabrata: NEGATIVE
Candida Vaginitis: NEGATIVE
Comment: NEGATIVE
Comment: NEGATIVE
Comment: NEGATIVE

## 2022-07-12 NOTE — Therapy (Signed)
OUTPATIENT PHYSICAL THERAPY FEMALE PELVIC TREATMENT   Patient Name: Karen [name] MRN: [id number] DOB:1970/05/30, 52 y.o., female Today's Date: 07/12/2022  END OF SESSION:  PT End of Session - 07/12/22 1107     Visit Number 6    Date for PT Re-Evaluation 11/16/22    Authorization Type [employer]    PT Start Time 1104    PT Stop Time 1142    PT Time Calculation (min) 38 min    Activity Tolerance Patient tolerated treatment well    Behavior During Therapy [facility] for tasks assessed/performed                 Past Medical History:  Diagnosis Date   Adenomyosis    Anemia    Arthritis 2013   L knee gets steroid injections    Asthmatic bronchitis 01/31/2017   Back pain    Chronic constipation    Diarrhea    DVT of lower extremity (deep venous thrombosis) (HCC)    Dysmenorrhea    Endometriosis    Esophageal stricture    GERD (gastroesophageal reflux disease)    History of kidney stones    History of uterine fibroid    IBS (irritable bowel syndrome)    Interstitial cystitis    Lactose intolerance    Migraine    with aura   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   PONV (postoperative nausea and vomiting)    Recurrent upper respiratory infection (URI)    Sebaceous cyst of breast, right lower inner quadrant 10/25/2012   Excised 11/21/12    Sleep apnea    Syncope, non cardiac    Past Surgical History:  Procedure Laterality Date   ARTHROSCOPIC HAGLUNDS REPAIR     BREAST CYST EXCISION Right 11/21/2012   Procedure: CYST EXCISION BREAST;  Surgeon: Currie Paris, MD;  Location: [facility];  Service: General;  Laterality: Right;   BREAST CYST EXCISION Bilateral 01/18/2022   Procedure: EXCISION OF SEBACEOUS CYST BILATERAL BREAST;  Surgeon: Harriette Bouillon, MD;  Location: [facility];  Service: General;  Laterality: Bilateral;   BREAST EXCISIONAL BIOPSY Right 11/2012   CESAREAN SECTION  04/19/1993   CHOLECYSTECTOMY     COLONOSCOPY   05/02/2011   Procedure: COLONOSCOPY;  Surgeon: Vertell Novak., MD;  Location: [facility];  Service: Endoscopy;  Laterality: N/A;   colonoscopy  11/04/2014   DENTAL SURGERY  03/06/2018   2 surgeries ( 03/06/2018 and 04/06/2018)   to remove two separate benign tumors   ESOPHAGEAL MANOMETRY N/A 11/04/2015   Procedure: ESOPHAGEAL MANOMETRY (EM);  Surgeon: Carman Ching, MD;  Location: [facility];  Service: Endoscopy;  Laterality: N/A;   ESOPHAGOGASTRODUODENOSCOPY  05/02/2011   Procedure: ESOPHAGOGASTRODUODENOSCOPY (EGD);  Surgeon: Vertell Novak., MD;  Location: [facility];  Service: Endoscopy;  Laterality: N/A;   ESOPHAGOGASTRODUODENOSCOPY N/A 09/01/2014   Procedure: ESOPHAGOGASTRODUODENOSCOPY (EGD);  Surgeon: Carman Ching, MD;  Location: [facility];  Service: Endoscopy;  Laterality: N/A;   KNEE SURGERY     LAPAROSCOPY     x 4   LESION REMOVAL N/A 01/18/2022   Procedure: EXCISION OF SEBACEOUS CYSTS CHEST AND NECK;  Surgeon: Harriette Bouillon, MD;  Location: [facility];  Service: General;  Laterality: N/A;   PH IMPEDANCE STUDY N/A 11/04/2015   Procedure: PH IMPEDANCE STUDY;  Surgeon: Carman Ching, MD;  Location: [facility];  Service: Endoscopy;  Laterality: N/A;   VAGINAL HYSTERECTOMY  2010   TVH--ovaries remain   Patient Active Problem  List   Diagnosis Date Noted   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   Elevated LFTs 04/04/2021   Colitis 04/04/2021   Bacterial overgrowth syndrome 05/21/2020   Obesity 05/21/2020   History of endometriosis 05/21/2020   Constipation due to outlet dysfunction 02/05/2020   Gastroesophageal reflux disease 02/05/2020   Obstructive sleep apnea treated with continuous positive airway pressure (CPAP) 06/16/2017   Perennial allergic rhinitis with a nonallergic component 01/31/2017   Asthmatic bronchitis 01/31/2017   GI symptoms 01/31/2017   Family history of colon cancer 01/27/2015   Chest pain 12/13/2012   Dyspnea 12/13/2012   DVT of  lower extremity (deep venous thrombosis) (HCC) 01/03/1990    PCP: Lorenda Ishihara, MD  REFERRING PROVIDER: Jerene Bears, MD   REFERRING DIAG: M62.89 (ICD-10-CM) - Pelvic floor dysfunction  THERAPY DIAG:  Pelvic pain  Other low back pain  Urgency incontinence  Stress incontinence (female) (female)  Muscle weakness (generalized)  Other muscle spasm  Unspecified lack of coordination  Rationale for Evaluation and Treatment: Rehabilitation  ONSET DATE: chronic  SUBJECTIVE:                                                                                                                                                                                           SUBJECTIVE STATEMENT: Pt states that she has been to MD and does not have UTI, but continues to have burning/urgency. She is She is having 6-7 bowel movements a day that are very long and more like logs vs liquid. MD has said if abdominal heaviness does not improve to increase Miralax to 3x/day.  PAIN:  Are you having pain? Yes NPRS scale: 4/10 Pain location:  Lower abdominal pain, Lt sided pain, low back pain  Pain type: turning, knotted, pressure Pain description: intermittent and constant   Aggravating factors: constipation or frequent bowel movements/constantly having bowel movements  Relieving factors: exercises (bowel massage, happy baby, spinal twists, walking)  PRECAUTIONS: None  WEIGHT BEARING RESTRICTIONS: No  FALLS:  Has patient fallen in last 6 months? No  LIVING ENVIRONMENT: Lives with: lives with their spouse Lives in: House/apartment  OCCUPATION: nurse, in admin  PLOF: Independent  PATIENT GOALS: decrease pain and have more normal bowel movements  PERTINENT HISTORY:  Vaginal hysterectomy, DVT LE, endometriosis, interstitial cystitis, exploratory laps for endometriosis, c-section, cholecystectomy Sexual abuse: No  BOWEL MOVEMENT: Pain with bowel movement: Yes Type of bowel  movement:Type (Bristol Stool Scale) 1-7 (sometimes full range in the same bowel movement), Frequency sometimes many times a day, sometimes up to 4 days in between, and Strain Yes Fully empty rectum: No Leakage: No Pads: No Fiber supplement: No -  has attempted metamucil and it made constipation worse  URINATION: Pain with urination: Yes Fully empty bladder: No Stream:  varies  Urgency: Yes: all the time Frequency: multiple times an hours  Leakage: Urge to void, Walking to the bathroom, Coughing, Sneezing, Laughing, and Exercise Pads: Yes: daily  INTERCOURSE: Pain with intercourse: Initial Penetration and During Penetration Ability to have vaginal penetration:  Yes: with pain Climax: non painful WNL   PREGNANCY: Vaginal deliveries 1 Tearing Yes: 3rd degree tear C-section deliveries 1 Currently pregnant No  PROLAPSE: Periodically will feel vaginal bulging, heaviness in lower abdomen   OBJECTIVE:  06/08/22:               External Perineal Exam: WNL                              Internal Pelvic Floor: burning reported with palpation of superficial muscles; deep aching/pressure with palpation of Lt levator ani   Patient confirms identification and approves PT to assess internal pelvic floor and treatment Yes  PELVIC MMT:   MMT eval  Vaginal 3/5  Internal Anal Sphincter 1/5  External Anal Sphincter 2/5  Puborectalis 1/5  Diastasis Recti   (Blank rows = not tested)        TONE: High in Lt levator ani   PROLAPSE: Not able to tell this session due to dyssynergic pelvic floor contraction     06/01/22: COGNITION: Overall cognitive status: Within functional limits for tasks assessed     SENSATION: Light touch: Appears intact Proprioception: Appears intact  GAIT: Comments: decreased hip extension, forward flexed trunk  POSTURE: rounded shoulders, forward head, decreased lumbar lordosis, increased thoracic kyphosis, posterior pelvic tilt, and flexed trunk     LUMBARAROM/PROM:  A/PROM A/PROM  Eval (% available)  Flexion 50  Extension 25, pain anteriorly   Right lateral flexion 50, pain anteriorly   Left lateral flexion 50, pain anteriorly    (Blank rows = not tested)   PALPATION:   General  Significant abdominal restriction and tenderness; decreased rib mobility with mobilization and breathing    TODAY'S TREATMENT:                                                                                                                              DATE:  07/12/22 Manual: Pt provides verbal consent for internal vaginal/rectal pelvic floor exam. Intenrla pelvic floor muscle release Urethra/bladder mobilization internal/external Exercises: Bridge with pelvic floor contraction 15x Modified thomas stretch 60 sec bil   06/29/22 Manual: Myofascial release to low back and bil glutes PA mobilizations T1-L1 grade 2-3 Abdominal/bowel mobilization Neuromuscular re-education: Diaphragmatic breathing with tactile cues at lower rib cage for improved excursion Therapeutic activities: Reviewed attempt at bowel retraining Reviewed and encouraged bladder retraining    06/20/22 Manual: Myofascial release to low back and bil glutes PA mobilizations T1-L1 grade 2-3 Exercises: Seated lateral flexion over peanut 3 min  bil Seated P stretch with rotation 2 min bil Therapeutic activities: Peri-bottle, wet wipes, protective barrier Urge drill Strict bowel/bladder retraining      PATIENT EDUCATION:  Education details: See above Person educated: Patient Education method: Explanation, Demonstration, Tactile cues, Verbal cues, and Handouts Education comprehension: verbalized understanding  HOME EXERCISE PROGRAM: [id number]  ASSESSMENT:  CLINICAL IMPRESSION: Pt describing bladder/urinary symptoms that are consistent with increased pelvic floor tension; internal exam demonstrated notable tightness and sensation of burning that did not dissipate  with release of tight muscles. She had persistent referral into Rt lower quadrant after deep internal pelvic floor muscle release, indicating involvement of these muscles in abdominal discomfort - she was educated on this relationship. Good tolerance to bridges and modified thomas stretch to help decrease abdominal discomfort and pressure. She will continue to benefit from skilled PT intervention in order to decrease pain, improve bowel movements, and decrease urinary urgency/incontinence.     OBJECTIVE IMPAIRMENTS: decreased activity tolerance, decreased coordination, decreased endurance, decreased mobility, decreased strength, increased fascial restrictions, increased muscle spasms, impaired tone, postural dysfunction, and pain.   ACTIVITY LIMITATIONS: lifting, bending, continence, and locomotion level  PARTICIPATION LIMITATIONS: interpersonal relationship, community activity, and occupation  PERSONAL FACTORS: 1 comorbidity: medical history  are also affecting patient's functional outcome.   REHAB POTENTIAL: Good  CLINICAL DECISION MAKING: Stable/uncomplicated  EVALUATION COMPLEXITY: Low   GOALS: Goals reviewed with patient? Yes  SHORT TERM GOALS: Target date: 07/06/22 - updated 07/12/22  Pt will be independent with HEP.   Baseline: Goal status: MET 07/12/22  2.  Pt will be independent with diaphragmatic breathing and down training activities in order to improve pelvic floor relaxation.  Baseline:  Goal status: MET 07/12/22  3.  Pt will be independent with the knack, urge suppression technique, and double voiding in order to improve bladder habits and decrease urinary incontinence.   Baseline:  Goal status: IN PROGRESS  4.  Pt will be independent with use of squatty potty, relaxed toileting mechanics, and improved bowel movement techniques in order to increase ease of bowel movements and complete evacuation.   Baseline:  Goal status:IN PROGRESS  5.  Pt will be able to correctly  perform diaphragmatic breathing and appropriate pressure management in order to prevent worsening vaginal wall laxity and improve pelvic floor A/ROM.   Baseline:  Goal status: IN PROGRESS  LONG TERM GOALS: Target date: 11/16/2022 - updated 07/12/22  Pt will be independent with advanced HEP.   Baseline:  Goal status: IN PROGRESS  2.  Pt will demonstrate normal pelvic floor muscle tone and A/ROM, able to achieve 4/5 strength with contractions and 10 sec endurance, in order to provide appropriate lumbopelvic support in functional activities.   Baseline:  Goal status: IN PROGRESS  3.  Pt will increase all impaired lumbar A/ROM by 25% without pain.  Baseline:  Goal status:  IN PROGRESS  4.  Pt will report pain no higher than 3/10 with any activity. Baseline:  Goal status:  IN PROGRESS  5.  Pt will report 0/10 pain with vaginal penetration in order to improve intimate relationship with partner.    Baseline:  Goal status:  IN PROGRESS  6.  Pt will be able to go 2-3 hours in between voids without urgency or incontinence in order to improve QOL and perform all functional activities with less difficulty.   Baseline:  Goal status: IN PROGRESS  7.  Pt will report no leaks with laughing, coughing, sneezing in order to improve comfort with  interpersonal relationships and community activities.   Baseline:  Goal status:  IN PROGRESS  8.  Pt will have consistent bowel movements 4-5x/week without straining or pain.  Baseline:  Goal status:  IN PROGRESS  PLAN:  PT FREQUENCY: 1-2x/week  PT DURATION: 6 months  PLANNED INTERVENTIONS: Therapeutic exercises, Therapeutic activity, Neuromuscular re-education, Balance training, Gait training, Patient/Family education, Self Care, Joint mobilization, Dry Needling, Biofeedback, and Manual therapy  PLAN FOR NEXT SESSION: Begin core training; progress mobility; pelvic floor release.    Julio Alm, PT, DPT07/09/2409:43 AM

## 2022-07-21 ENCOUNTER — Ambulatory Visit: Payer: Commercial Managed Care - PPO

## 2022-07-21 DIAGNOSIS — R279 Unspecified lack of coordination: Secondary | ICD-10-CM

## 2022-07-21 DIAGNOSIS — M6281 Muscle weakness (generalized): Secondary | ICD-10-CM

## 2022-07-21 DIAGNOSIS — M5459 Other low back pain: Secondary | ICD-10-CM

## 2022-07-21 DIAGNOSIS — R102 Pelvic and perineal pain: Secondary | ICD-10-CM

## 2022-07-21 DIAGNOSIS — N393 Stress incontinence (female) (male): Secondary | ICD-10-CM

## 2022-07-21 DIAGNOSIS — M62838 Other muscle spasm: Secondary | ICD-10-CM

## 2022-07-21 DIAGNOSIS — N3941 Urge incontinence: Secondary | ICD-10-CM | POA: Diagnosis not present

## 2022-07-21 NOTE — Therapy (Signed)
OUTPATIENT PHYSICAL THERAPY FEMALE PELVIC TREATMENT   Patient Name: Karen [name] MRN: [id number] DOB:[birth year], 51 y.o., female Today's Date: 07/21/2022  END OF SESSION:  PT End of Session - 07/21/22 1147     Visit Number 7    Date for PT Re-Evaluation 11/16/22    Authorization Type [employer]    PT Start Time 1145    PT Stop Time 1225    PT Time Calculation (min) 40 min    Activity Tolerance Patient tolerated treatment well    Behavior During Therapy [facility] for tasks assessed/performed                 Past Medical History:  Diagnosis Date   Adenomyosis    Anemia    Arthritis 2013   L knee gets steroid injections    Asthmatic bronchitis 01/31/2017   Back pain    Chronic constipation    Diarrhea    DVT of lower extremity (deep venous thrombosis) (HCC)    Dysmenorrhea    Endometriosis    Esophageal stricture    GERD (gastroesophageal reflux disease)    History of kidney stones    History of uterine fibroid    IBS (irritable bowel syndrome)    Interstitial cystitis    Lactose intolerance    Migraine    with aura   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   PONV (postoperative nausea and vomiting)    Recurrent upper respiratory infection (URI)    Sebaceous cyst of breast, right lower inner quadrant 10/25/2012   Excised 11/21/12    Sleep apnea    Syncope, non cardiac    Past Surgical History:  Procedure Laterality Date   ARTHROSCOPIC HAGLUNDS REPAIR     BREAST CYST EXCISION Right 11/21/2012   Procedure: CYST EXCISION BREAST;  Surgeon: Currie Paris, MD;  Location: [facility];  Service: General;  Laterality: Right;   BREAST CYST EXCISION Bilateral 01/18/2022   Procedure: EXCISION OF SEBACEOUS CYST BILATERAL BREAST;  Surgeon: Harriette Bouillon, MD;  Location: [facility];  Service: General;  Laterality: Bilateral;   BREAST EXCISIONAL BIOPSY Right 11/2012   CESAREAN SECTION  04/19/1993   CHOLECYSTECTOMY     COLONOSCOPY   05/02/2011   Procedure: COLONOSCOPY;  Surgeon: Vertell Novak., MD;  Location: [facility];  Service: Endoscopy;  Laterality: N/A;   colonoscopy  11/04/2014   DENTAL SURGERY  03/06/2018   2 surgeries ( 03/06/2018 and 04/06/2018)   to remove two separate benign tumors   ESOPHAGEAL MANOMETRY N/A 11/04/2015   Procedure: ESOPHAGEAL MANOMETRY (EM);  Surgeon: Carman Ching, MD;  Location: [facility];  Service: Endoscopy;  Laterality: N/A;   ESOPHAGOGASTRODUODENOSCOPY  05/02/2011   Procedure: ESOPHAGOGASTRODUODENOSCOPY (EGD);  Surgeon: Vertell Novak., MD;  Location: [facility];  Service: Endoscopy;  Laterality: N/A;   ESOPHAGOGASTRODUODENOSCOPY N/A 09/01/2014   Procedure: ESOPHAGOGASTRODUODENOSCOPY (EGD);  Surgeon: Carman Ching, MD;  Location: [facility];  Service: Endoscopy;  Laterality: N/A;   KNEE SURGERY     LAPAROSCOPY     x 4   LESION REMOVAL N/A 01/18/2022   Procedure: EXCISION OF SEBACEOUS CYSTS CHEST AND NECK;  Surgeon: Harriette Bouillon, MD;  Location:  SUR[facility]ERY CENTER;  Service: General;  Laterality: N/A;   PH IMPEDANCE STUDY N/A 11/04/2015   Procedure: PH IMPEDANCE STUDY;  Surgeon: Carman Ching, MD;  Location: [facility];  Service: Endoscopy;  Laterality: N/A;   VAGINAL HYSTERECTOMY  2010   TVH--ovaries remain   Patient Active Problem  List   Diagnosis Date Noted   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   Elevated LFTs 04/04/2021   Colitis 04/04/2021   Bacterial overgrowth syndrome 05/21/2020   Obesity 05/21/2020   History of endometriosis 05/21/2020   Constipation due to outlet dysfunction 02/05/2020   Gastroesophageal reflux disease 02/05/2020   Obstructive sleep apnea treated with continuous positive airway pressure (CPAP) 06/16/2017   Perennial allergic rhinitis with a nonallergic component 01/31/2017   Asthmatic bronchitis 01/31/2017   GI symptoms 01/31/2017   Family history of colon cancer 01/27/2015   Chest pain 12/13/2012   Dyspnea 12/13/2012   DVT of  lower extremity (deep venous thrombosis) (HCC) 01/03/1990    PCP: Lorenda Ishihara, MD  REFERRING PROVIDER: Jerene Bears, MD   REFERRING DIAG: M62.89 (ICD-10-CM) - Pelvic floor dysfunction  THERAPY DIAG:  Pelvic pain  Other low back pain  Urgency incontinence  Stress incontinence (female) (female)  Muscle weakness (generalized)  Other muscle spasm  Unspecified lack of coordination  Rationale for Evaluation and Treatment: Rehabilitation  ONSET DATE: chronic  SUBJECTIVE:                                                                                                                                                                                           SUBJECTIVE STATEMENT: Pt states that she has been using steroid cream vaginally. She is noticing more heaviness throughout hips and into Rt LE. She is waiting to see if cream if helpful in order to decide if she is going to get biopsy. She is seeing benefit from double voiding. She is working on urge drill with bowel movements and bladder in order in retraining program. She has gotten Training and development officer.  PAIN:  Are you having pain? Yes NPRS scale: 4/10 Pain location:  Lower abdominal pain, Lt sided pain, low back pain  Pain type: turning, knotted, pressure Pain description: intermittent and constant   Aggravating factors: constipation or frequent bowel movements/constantly having bowel movements  Relieving factors: exercises (bowel massage, happy baby, spinal twists, walking)  PRECAUTIONS: None  WEIGHT BEARING RESTRICTIONS: No  FALLS:  Has patient fallen in last 6 months? No  LIVING ENVIRONMENT: Lives with: lives with their spouse Lives in: House/apartment  OCCUPATION: nurse, in admin  PLOF: Independent  PATIENT GOALS: decrease pain and have more normal bowel movements  PERTINENT HISTORY:  Vaginal hysterectomy, DVT LE, endometriosis, interstitial cystitis, exploratory laps for endometriosis, c-section,  cholecystectomy Sexual abuse: No  BOWEL MOVEMENT: Pain with bowel movement: Yes Type of bowel movement:Type (Bristol Stool Scale) 1-7 (sometimes full range in the same bowel movement), Frequency sometimes many times a day, sometimes up to 4  days in between, and Strain Yes Fully empty rectum: No Leakage: No Pads: No Fiber supplement: No - has attempted metamucil and it made constipation worse  URINATION: Pain with urination: Yes Fully empty bladder: No Stream:  varies  Urgency: Yes: all the time Frequency: multiple times an hours  Leakage: Urge to void, Walking to the bathroom, Coughing, Sneezing, Laughing, and Exercise Pads: Yes: daily  INTERCOURSE: Pain with intercourse: Initial Penetration and During Penetration Ability to have vaginal penetration:  Yes: with pain Climax: non painful WNL   PREGNANCY: Vaginal deliveries 1 Tearing Yes: 3rd degree tear C-section deliveries 1 Currently pregnant No  PROLAPSE: Periodically will feel vaginal bulging, heaviness in lower abdomen   OBJECTIVE:  07/21/22: standing prolapse assessment demonstrated grade 2 anterior vaginal wall laxity   06/08/22:               External Perineal Exam: WNL                              Internal Pelvic Floor: burning reported with palpation of superficial muscles; deep aching/pressure with palpation of Lt levator ani   Patient confirms identification and approves PT to assess internal pelvic floor and treatment Yes  PELVIC MMT:   MMT eval  Vaginal 3/5  Internal Anal Sphincter 1/5  External Anal Sphincter 2/5  Puborectalis 1/5  Diastasis Recti   (Blank rows = not tested)        TONE: High in Lt levator ani   PROLAPSE: Not able to tell this session due to dyssynergic pelvic floor contraction     06/01/22: COGNITION: Overall cognitive status: Within functional limits for tasks assessed     SENSATION: Light touch: Appears intact Proprioception: Appears intact  GAIT: Comments: decreased  hip extension, forward flexed trunk  POSTURE: rounded shoulders, forward head, decreased lumbar lordosis, increased thoracic kyphosis, posterior pelvic tilt, and flexed trunk    LUMBARAROM/PROM:  A/PROM A/PROM  Eval (% available)  Flexion 50  Extension 25, pain anteriorly   Right lateral flexion 50, pain anteriorly   Left lateral flexion 50, pain anteriorly    (Blank rows = not tested)   PALPATION:   General  Significant abdominal restriction and tenderness; decreased rib mobility with mobilization and breathing    TODAY'S TREATMENT:                                                                                                                              DATE:  07/20/22 Manual: Pt provides verbal consent for internal vaginal/rectal pelvic floor exam. Bil levator ani pelvic floor muscle release Standing prolapse assessment Exercises: Wide leg lower trunk rotation 12x Piriformis stretch 60 sec bil Hip flexor stretch 60 sec bil   07/12/22 Manual: Pt provides verbal consent for internal vaginal/rectal pelvic floor exam. Intenrla pelvic floor muscle release Urethra/bladder mobilization internal/external Exercises: Bridge with pelvic floor contraction 15x Modified thomas stretch 60  sec bil   06/29/22 Manual: Myofascial release to low back and bil glutes PA mobilizations T1-L1 grade 2-3 Abdominal/bowel mobilization Neuromuscular re-education: Diaphragmatic breathing with tactile cues at lower rib cage for improved excursion Therapeutic activities: Reviewed attempt at bowel retraining Reviewed and encouraged bladder retraining    PATIENT EDUCATION:  Education details: See above Person educated: Patient Education method: Explanation, Demonstration, Tactile cues, Verbal cues, and Handouts Education comprehension: verbalized understanding  HOME EXERCISE PROGRAM: [id number]  ASSESSMENT:  CLINICAL IMPRESSION: Pt having more pain over the last week in Rt side and  more bil hip/back pressure. Believe this may be due to irritating pelvic floor muscles last session with starting internal release of these muscles. Standing prolapse assessment performed with better visualization of grade 2 anterior vaginal wall laxity; this could be contributing to the pressure and pelvic floor muscle tension (further contributing to sensation of hip pressure) that is occurring. She has reproduction of Rt sided pain and bil hip pressure with release of pelvic floor muscles today. She overall reported aggravation of pain, but this was decreased some at end of session after performing several stretches to help improve mobility. She will continue to benefit from skilled PT intervention in order to decrease pain, improve bowel movements, and decrease urinary urgency/incontinence.     OBJECTIVE IMPAIRMENTS: decreased activity tolerance, decreased coordination, decreased endurance, decreased mobility, decreased strength, increased fascial restrictions, increased muscle spasms, impaired tone, postural dysfunction, and pain.   ACTIVITY LIMITATIONS: lifting, bending, continence, and locomotion level  PARTICIPATION LIMITATIONS: interpersonal relationship, community activity, and occupation  PERSONAL FACTORS: 1 comorbidity: medical history  are also affecting patient's functional outcome.   REHAB POTENTIAL: Good  CLINICAL DECISION MAKING: Stable/uncomplicated  EVALUATION COMPLEXITY: Low   GOALS: Goals reviewed with patient? Yes  SHORT TERM GOALS: Target date: 07/06/22 - updated 07/12/22  Pt will be independent with HEP.   Baseline: Goal status: MET 07/12/22  2.  Pt will be independent with diaphragmatic breathing and down training activities in order to improve pelvic floor relaxation.  Baseline:  Goal status: MET 07/12/22  3.  Pt will be independent with the knack, urge suppression technique, and double voiding in order to improve bladder habits and decrease urinary incontinence.    Baseline:  Goal status: IN PROGRESS  4.  Pt will be independent with use of squatty potty, relaxed toileting mechanics, and improved bowel movement techniques in order to increase ease of bowel movements and complete evacuation.   Baseline:  Goal status:IN PROGRESS  5.  Pt will be able to correctly perform diaphragmatic breathing and appropriate pressure management in order to prevent worsening vaginal wall laxity and improve pelvic floor A/ROM.   Baseline:  Goal status: IN PROGRESS  LONG TERM GOALS: Target date: 11/16/2022 - updated 07/12/22  Pt will be independent with advanced HEP.   Baseline:  Goal status: IN PROGRESS  2.  Pt will demonstrate normal pelvic floor muscle tone and A/ROM, able to achieve 4/5 strength with contractions and 10 sec endurance, in order to provide appropriate lumbopelvic support in functional activities.   Baseline:  Goal status: IN PROGRESS  3.  Pt will increase all impaired lumbar A/ROM by 25% without pain.  Baseline:  Goal status:  IN PROGRESS  4.  Pt will report pain no higher than 3/10 with any activity. Baseline:  Goal status:  IN PROGRESS  5.  Pt will report 0/10 pain with vaginal penetration in order to improve intimate relationship with partner.    Baseline:  Goal  status:  IN PROGRESS  6.  Pt will be able to go 2-3 hours in between voids without urgency or incontinence in order to improve QOL and perform all functional activities with less difficulty.   Baseline:  Goal status: IN PROGRESS  7.  Pt will report no leaks with laughing, coughing, sneezing in order to improve comfort with interpersonal relationships and community activities.   Baseline:  Goal status:  IN PROGRESS  8.  Pt will have consistent bowel movements 4-5x/week without straining or pain.  Baseline:  Goal status:  IN PROGRESS  PLAN:  PT FREQUENCY: 1-2x/week  PT DURATION: 6 months  PLANNED INTERVENTIONS: Therapeutic exercises, Therapeutic activity,  Neuromuscular re-education, Balance training, Gait training, Patient/Family education, Self Care, Joint mobilization, Dry Needling, Biofeedback, and Manual therapy  PLAN FOR NEXT SESSION: Begin core training; progress mobility; pelvic floor release.    Julio Alm, PT, DPT07/18/2412:32 PM

## 2022-07-24 ENCOUNTER — Emergency Department (HOSPITAL_BASED_OUTPATIENT_CLINIC_OR_DEPARTMENT_OTHER)
Admission: EM | Admit: 2022-07-24 | Discharge: 2022-07-25 | Disposition: A | Discharge status: Home / Self Care | Payer: Commercial Managed Care - PPO | Attending: Emergency Medicine | Admitting: Emergency Medicine

## 2022-07-24 ENCOUNTER — Other Ambulatory Visit: Payer: Self-pay

## 2022-07-24 ENCOUNTER — Encounter (HOSPITAL_BASED_OUTPATIENT_CLINIC_OR_DEPARTMENT_OTHER): Payer: Self-pay

## 2022-07-24 DIAGNOSIS — R110 Nausea: Secondary | ICD-10-CM | POA: Diagnosis not present

## 2022-07-24 DIAGNOSIS — R9431 Abnormal electrocardiogram [ECG] [EKG]: Secondary | ICD-10-CM | POA: Diagnosis not present

## 2022-07-24 DIAGNOSIS — K5900 Constipation, unspecified: Secondary | ICD-10-CM | POA: Insufficient documentation

## 2022-07-24 DIAGNOSIS — R1032 Left lower quadrant pain: Secondary | ICD-10-CM | POA: Diagnosis not present

## 2022-07-24 DIAGNOSIS — R1012 Left upper quadrant pain: Secondary | ICD-10-CM | POA: Diagnosis not present

## 2022-07-24 LAB — COMPREHENSIVE METABOLIC PANEL
ALT: 20 U/L (ref 0–44)
AST: 16 U/L (ref 15–41)
Albumin: 4.4 g/dL (ref 3.5–5.0)
Alkaline Phosphatase: 107 U/L (ref 38–126)
Anion gap: 7 (ref 5–15)
BUN: 14 mg/dL (ref 6–20)
CO2: 30 mmol/L (ref 22–32)
Calcium: 9.7 mg/dL (ref 8.9–10.3)
Chloride: 102 mmol/L (ref 98–111)
Creatinine, Ser: 0.69 mg/dL (ref 0.44–1.00)
GFR, Estimated: >60 mL/min (ref >60)
Glucose, Bld: 81 mg/dL (ref 70–99)
Potassium: 3.6 mmol/L (ref 3.5–5.1)
Sodium: 139 mmol/L (ref 135–145)
Total Bilirubin: 0.8 mg/dL (ref 0.3–1.2)
Total Protein: 7.8 g/dL (ref 6.5–8.1)

## 2022-07-24 LAB — CBC
HCT: 40.1 % (ref 36.0–46.0)
Hemoglobin: 12.9 g/dL (ref 12.0–15.0)
MCH: 31.9 pg (ref 26.0–34.0)
MCHC: 32.2 g/dL (ref 30.0–36.0)
MCV: 99 fL (ref 80.0–100.0)
Platelets: 209 10*3/uL (ref 150–400)
RBC: 4.05 MIL/uL (ref 3.87–5.11)
RDW: 12.3 % (ref 11.5–15.5)
WBC: 5.5 10*3/uL (ref 4.0–10.5)
nRBC: 0 % (ref 0.0–0.2)

## 2022-07-24 LAB — URINALYSIS, ROUTINE W REFLEX MICROSCOPIC
Bilirubin Urine: NEGATIVE
Glucose, UA: NEGATIVE mg/dL
Hgb urine dipstick: NEGATIVE
Ketones, ur: NEGATIVE mg/dL
Leukocytes,Ua: NEGATIVE
Nitrite: NEGATIVE
Protein, ur: NEGATIVE mg/dL
Specific Gravity, Urine: 1.011 (ref 1.005–1.030)
pH: 7.5 (ref 5.0–8.0)

## 2022-07-24 LAB — LIPASE, BLOOD: Lipase: 42 U/L (ref 11–51)

## 2022-07-24 MED ORDER — ONDANSETRON HCL 4 MG/2ML IJ SOLN
4.0000 mg | Freq: Once | INTRAMUSCULAR | Status: AC
  Administered 2022-07-24: 4 mg via INTRAVENOUS
  Filled 2022-07-24: qty 2

## 2022-07-24 MED ORDER — MORPHINE SULFATE (PF) 4 MG/ML IV SOLN
4.0000 mg | Freq: Once | INTRAVENOUS | Status: AC
  Administered 2022-07-24: 4 mg via INTRAVENOUS
  Filled 2022-07-24: qty 1

## 2022-07-24 MED ORDER — LACTATED RINGERS IV BOLUS
1000.0000 mL | Freq: Once | INTRAVENOUS | Status: AC
  Administered 2022-07-24: 1000 mL via INTRAVENOUS

## 2022-07-24 NOTE — ED Notes (Signed)
Spoke with patient regarding Wait Time. Concerns voiced and Patient understanding of situation. Patient will be placed in Examination Room when appropriate.

## 2022-07-24 NOTE — ED Triage Notes (Signed)
Patient here POV from Home.  Endorses LUQ ABD Pain that radiates to L Back/Flank. Began at 1000.   Some Nausea. No Emesis. Some Constipation. No Diarrhea. No known Fevers. No Dysuria.   NAD Noted during Triage. A&Ox4. Gcs 15. Ambulatory.

## 2022-07-25 ENCOUNTER — Other Ambulatory Visit (HOSPITAL_COMMUNITY): Payer: Self-pay

## 2022-07-25 ENCOUNTER — Emergency Department (HOSPITAL_BASED_OUTPATIENT_CLINIC_OR_DEPARTMENT_OTHER): Payer: Commercial Managed Care - PPO

## 2022-07-25 DIAGNOSIS — R1032 Left lower quadrant pain: Secondary | ICD-10-CM | POA: Diagnosis not present

## 2022-07-25 DIAGNOSIS — K5900 Constipation, unspecified: Secondary | ICD-10-CM | POA: Diagnosis not present

## 2022-07-25 DIAGNOSIS — K449 Diaphragmatic hernia without obstruction or gangrene: Secondary | ICD-10-CM | POA: Diagnosis not present

## 2022-07-25 LAB — TROPONIN I (HIGH SENSITIVITY): Troponin I (High Sensitivity): <2 ng/L (ref <18)

## 2022-07-25 MED ORDER — IOHEXOL 300 MG/ML  SOLN
100.0000 mL | Freq: Once | INTRAMUSCULAR | Status: AC | PRN
  Administered 2022-07-25: 100 mL via INTRAVENOUS

## 2022-07-25 NOTE — ED Notes (Signed)
Reviewed AVS/discharge instruction with patient. Time allotted for and all questions answered. Patient is agreeable for d/c and escorted to ed exit by staff.  

## 2022-07-25 NOTE — ED Provider Notes (Signed)
Care of the patient assumed at shift change pending CT abd/pel. Labs are unremarkable.  Physical Exam  BP 124/82   Pulse 64   Temp (!) 97.5 F (36.4 C) (Oral)   Resp 18   Ht 5\' 8"  (1.727 m)   Wt 93 kg   SpO2 96%   BMI 31.17 kg/m   Physical Exam  Procedures  Procedures  ED Course / MDM   Clinical Course as of 07/25/22 0140  Mon Jul 25, 2022  0136 I personally viewed the images from radiology studies and agree with radiologist interpretation:  CT is neg for acute process, there is moderate stool burden. Discussed these results with the patient. Recommend she contact her GI doctor about adjustments to her bowel regimen at home. PCP follow up, RTED for any other concerns.   [CS]    Clinical Course User Index [CS] Pollyann Savoy, MD   Medical Decision Making Problems Addressed: Constipation, unspecified constipation type: acute illness or injury  Amount and/or Complexity of Data Reviewed Labs: ordered. Radiology: ordered and independent interpretation performed. Decision-making details documented in ED Course.  Risk OTC drugs. Prescription drug management.          Pollyann Savoy, MD 07/25/22 [phone]

## 2022-07-25 NOTE — ED Provider Notes (Signed)
[place] EMERGENCY DEPARTMENT AT [facility] Provider Note   CSN: [id number] Arrival date & time: 07/24/22  1607     History Chief Complaint  Patient presents with   Abdominal Pain    Karen [name] is a 52 y.o. female with history of eosinophilic esophagitis, mixed type IBS, SIBO, kidney stones, hemochromatosis presents emerged from today for evaluation of left-sided abdominal pain.  Patient reports that the pain started around 1000 today.  Denies any trauma to her abdomen.  She reports it started in her left upper quadrant and radiate down into her left lower quadrant and left flank area.  Reports some nausea intermittently but no vomiting.  She denies any chest pain, shortness of breath, dysuria, hematuria, vomiting, diarrhea, melena, hematochezia, fever, chills, vaginal discharge, vaginal bleeding, or any rhinorrhea.  She does not drink out of any well water.  Last bowel movement today was soft.  She describes it as more of a throbbing in nature.  Still able to pass gas.  No known drug allergies.  Denies any tobacco, EtOH, licit drug use.  Abdominal Pain Associated symptoms: nausea   Associated symptoms: no chest pain, no chills, no constipation, no diarrhea, no dysuria, no fever, no hematuria, no shortness of breath, no vaginal bleeding, no vaginal discharge and no vomiting        Home Medications Prior to Admission medications   Medication Sig Start Date End Date Taking? Authorizing Provider  Acetaminophen 500 MG capsule 1 capsule as needed Orally every 6 hrs    [provider]  albuterol (VENTOLIN HFA) 108 (90 Base) MCG/ACT inhaler Inhale 2 puffs into the lungs every 6 (six) hours as needed for wheezing or shortness of breath. 12/09/21   Alfonse Spruce, MD  clobetasol ointment (TEMOVATE) 0.05 % Apply 1 Application topically as directed 2 (two) times daily. 07/11/22   Jerene Bears, MD  Elderberry 575 MG/5ML SYRP Take 30 mLs by mouth daily.    [provider]  Lactobacillus Casei-Folic Acid (RESTORA RX) 60-1.25 MG CAPS Take 1 capsule by mouth daily between meals 08/11/21     lansoprazole (PREVACID) 15 MG capsule Take 1 capsule (15 mg total) by mouth daily. Patient not taking: Reported on 07/11/2022 05/05/22     lansoprazole (PREVACID) 30 MG capsule Take 1 capsule (30 mg total) by mouth daily. 06/24/22     levocetirizine (XYZAL) 5 MG tablet Take 1 tablet (5 mg total) by mouth every evening. Patient taking differently: Take 5 mg by mouth daily as needed for allergies. 04/25/17   Bobbitt, Heywood Iles, MD  Lifitegrast Benay Spice) 5 % SOLN Place 1 drop into both eyes 2 (two) times daily. 06/02/22     ondansetron (ZOFRAN-ODT) 4 MG disintegrating tablet Dissolve 1 tablet (4 mg total) in mouth every 8 (eight) hours as needed for nausea 03/03/22     polyethylene glycol (MIRALAX / GLYCOLAX) 17 g packet Take 17 g by mouth every other day.    [provider]      Allergies    Molds & smuts    Review of Systems   Review of Systems  Constitutional:  Negative for chills and fever.  HENT:  Negative for rhinorrhea.   Respiratory:  Negative for shortness of breath.   Cardiovascular:  Negative for chest pain.  Gastrointestinal:  Positive for abdominal pain and nausea. Negative for blood in stool, constipation, diarrhea and vomiting.  Genitourinary:  Positive for flank pain. Negative for dysuria, hematuria, vaginal bleeding and vaginal  discharge.    Physical Exam Updated Vital Signs BP 137/81   Pulse 65   Temp 98.3 F (36.8 C) (Oral)   Resp 18   Ht 5\' 8"  (1.727 m)   Wt 93 kg   SpO2 97%   BMI 31.17 kg/m  Physical Exam Vitals and nursing note reviewed.  Constitutional:      General: She is not in acute distress.    Appearance: She is not toxic-appearing.  Cardiovascular:     Rate and Rhythm: Normal rate.     Pulses:          Radial pulses are 2+ on the right side and 2+ on the left side.  Pulmonary:     Effort: Pulmonary effort is  normal. No respiratory distress.  Abdominal:     General: Bowel sounds are normal. There is no distension.     Palpations: Abdomen is soft.     Tenderness: There is generalized abdominal tenderness. There is left CVA tenderness. There is no right CVA tenderness, guarding or rebound.     Comments: Generalized abdominal tenderness palpation however its mainly in the left upper and left lower quadrant.  She does have some left CVA tenderness to percussion as well.  Normal active bowel sounds.  Soft.  No overlying skin changes appreciated.  Skin:    General: Skin is warm and dry.  Neurological:     General: No focal deficit present.     Mental Status: She is alert.     ED Results / Procedures / Treatments   Labs (all labs ordered are listed, but only abnormal results are displayed) Labs Reviewed  URINALYSIS, ROUTINE W REFLEX MICROSCOPIC - Abnormal; Notable for the following components:      Result Value   Color, Urine COLORLESS (*)    All other components within normal limits  LIPASE, BLOOD  COMPREHENSIVE METABOLIC PANEL  CBC  TROPONIN I (HIGH SENSITIVITY)    EKG None  Radiology No results found.  Procedures Procedures   Medications Ordered in ED Medications  morphine (PF) 4 MG/ML injection 4 mg (4 mg Intravenous Given 07/24/22 2350)  ondansetron (ZOFRAN) injection 4 mg (4 mg Intravenous Given 07/24/22 2349)  lactated ringers bolus 1,000 mL (1,000 mLs Intravenous New Bag/Given 07/24/22 2359)    ED Course/ Medical Decision Making/ A&P                           Medical Decision Making Amount and/or Complexity of Data Reviewed Labs: ordered. Radiology: ordered.  Risk Prescription drug management.   52 y.o. female presents to the ER for evaluation of left-sided abdominal pain. Differential diagnosis includes but is not limited to AAA, mesenteric ischemia, appendicitis, diverticulitis, DKA, gastroenteritis, nephrolithiasis, pancreatitis, constipation, UTI, bowel  obstruction, biliary disease, IBD, PUD, hepatitis. Vital signs unremarkable. Physical exam as noted above.   I independently reviewed and interpreted the patient's labs.  Urinalysis shows colorless urine that is unremarkable.  CMP shows no electrolyte or LFT abnormality.  Lipase within normal limits.  CBC shows no leukocytosis or anemia.  Troponin pending at this time.  I ordered the patient morphine for pain, Zofran for nausea, and fluids.  CT abdomen pelvis pending at this time.  Will hand off to oncoming shift for lab and CT results and re-assessment.  12:05 AM Care of [name] transferred to Dr. Bernette Mayers at the end of my shift as the patient will require reassessment once labs/imaging have resulted. Patient  presentation, ED course, and plan of care discussed with review of all pertinent labs and imaging. Please see his/her note for further details regarding further ED course and disposition. Plan at time of handoff is follow up on labs, CT imaging and re-evaluate. This may be altered or completely changed at the discretion of the oncoming team pending results of further workup.   Portions of this report may have been transcribed using voice recognition software. Every effort was made to ensure accuracy; however, inadvertent computerized transcription errors may be present.   Final Clinical Impression(s) / ED Diagnoses Final diagnoses:  None    Rx / DC Orders ED Discharge Orders     None         Achille Rich, PA-C 07/25/22 0015    Rozelle Logan, DO 08/04/22 0809

## 2022-08-04 ENCOUNTER — Ambulatory Visit: Payer: Commercial Managed Care - PPO

## 2022-08-11 ENCOUNTER — Ambulatory Visit: Payer: Commercial Managed Care - PPO | Attending: Obstetrics & Gynecology

## 2022-08-11 DIAGNOSIS — R279 Unspecified lack of coordination: Secondary | ICD-10-CM | POA: Insufficient documentation

## 2022-08-11 DIAGNOSIS — M5459 Other low back pain: Secondary | ICD-10-CM | POA: Insufficient documentation

## 2022-08-11 DIAGNOSIS — N393 Stress incontinence (female) (male): Secondary | ICD-10-CM | POA: Insufficient documentation

## 2022-08-11 DIAGNOSIS — R102 Pelvic and perineal pain: Secondary | ICD-10-CM | POA: Diagnosis not present

## 2022-08-11 DIAGNOSIS — N3941 Urge incontinence: Secondary | ICD-10-CM | POA: Insufficient documentation

## 2022-08-11 DIAGNOSIS — M6281 Muscle weakness (generalized): Secondary | ICD-10-CM | POA: Insufficient documentation

## 2022-08-11 DIAGNOSIS — M62838 Other muscle spasm: Secondary | ICD-10-CM | POA: Insufficient documentation

## 2022-08-11 NOTE — Therapy (Signed)
OUTPATIENT PHYSICAL THERAPY FEMALE PELVIC TREATMENT   Patient Name: Karen [name] MRN: [id number] DOB:October 29, 1970, 52 y.o., female Today's Date: 08/11/2022  END OF SESSION:  PT End of Session - 08/11/22 1202     Visit Number 8    Date for PT Re-Evaluation 11/16/22    Authorization Type [employer]    PT Start Time 1200    PT Stop Time 1230    PT Time Calculation (min) 30 min    Activity Tolerance Patient tolerated treatment well    Behavior During Therapy [facility] for tasks assessed/performed                 Past Medical History:  Diagnosis Date   Adenomyosis    Anemia    Arthritis 2013   L knee gets steroid injections    Asthmatic bronchitis 01/31/2017   Back pain    Chronic constipation    Diarrhea    DVT of lower extremity (deep venous thrombosis) (HCC)    Dysmenorrhea    Endometriosis    Esophageal stricture    GERD (gastroesophageal reflux disease)    History of kidney stones    History of uterine fibroid    IBS (irritable bowel syndrome)    Interstitial cystitis    Lactose intolerance    Migraine    with aura   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   PONV (postoperative nausea and vomiting)    Recurrent upper respiratory infection (URI)    Sebaceous cyst of breast, right lower inner quadrant 10/25/2012   Excised 11/21/12    Sleep apnea    Syncope, non cardiac    Past Surgical History:  Procedure Laterality Date   ARTHROSCOPIC HAGLUNDS REPAIR     BREAST CYST EXCISION Right 11/21/2012   Procedure: CYST EXCISION BREAST;  Surgeon: Currie Paris, MD;  Location: [facility];  Service: General;  Laterality: Right;   BREAST CYST EXCISION Bilateral 01/18/2022   Procedure: EXCISION OF SEBACEOUS CYST BILATERAL BREAST;  Surgeon: Harriette Bouillon, MD;  Location: [facility];  Service: General;  Laterality: Bilateral;   BREAST EXCISIONAL BIOPSY Right 11/2012   CESAREAN SECTION  04/19/1993   CHOLECYSTECTOMY     COLONOSCOPY   05/02/2011   Procedure: COLONOSCOPY;  Surgeon: Vertell Novak., MD;  Location: [facility];  Service: Endoscopy;  Laterality: N/A;   colonoscopy  11/04/2014   DENTAL SURGERY  03/06/2018   2 surgeries ( 03/06/2018 and 04/06/2018)   to remove two separate benign tumors   ESOPHAGEAL MANOMETRY N/A 11/04/2015   Procedure: ESOPHAGEAL MANOMETRY (EM);  Surgeon: Carman Ching, MD;  Location: [facility];  Service: Endoscopy;  Laterality: N/A;   ESOPHAGOGASTRODUODENOSCOPY  05/02/2011   Procedure: ESOPHAGOGASTRODUODENOSCOPY (EGD);  Surgeon: Vertell Novak., MD;  Location: [facility];  Service: Endoscopy;  Laterality: N/A;   ESOPHAGOGASTRODUODENOSCOPY N/A 09/01/2014   Procedure: ESOPHAGOGASTRODUODENOSCOPY (EGD);  Surgeon: Carman Ching, MD;  Location: [facility];  Service: Endoscopy;  Laterality: N/A;   KNEE SURGERY     LAPAROSCOPY     x 4   LESION REMOVAL N/A 01/18/2022   Procedure: EXCISION OF SEBACEOUS CYSTS CHEST AND NECK;  Surgeon: Harriette Bouillon, MD;  Location: [facility];  Service: General;  Laterality: N/A;   PH IMPEDANCE STUDY N/A 11/04/2015   Procedure: PH IMPEDANCE STUDY;  Surgeon: Carman Ching, MD;  Location: [facility];  Service: Endoscopy;  Laterality: N/A;   VAGINAL HYSTERECTOMY  2010   TVH--ovaries remain   Patient Active Problem  List   Diagnosis Date Noted   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   Elevated LFTs 04/04/2021   Colitis 04/04/2021   Bacterial overgrowth syndrome 05/21/2020   Obesity 05/21/2020   History of endometriosis 05/21/2020   Constipation due to outlet dysfunction 02/05/2020   Gastroesophageal reflux disease 02/05/2020   Obstructive sleep apnea treated with continuous positive airway pressure (CPAP) 06/16/2017   Perennial allergic rhinitis with a nonallergic component 01/31/2017   Asthmatic bronchitis 01/31/2017   GI symptoms 01/31/2017   Family history of colon cancer 01/27/2015   Chest pain 12/13/2012   Dyspnea 12/13/2012   DVT of  lower extremity (deep venous thrombosis) (HCC) 01/03/1990    PCP: Lorenda Ishihara, MD  REFERRING PROVIDER: Jerene Bears, MD   REFERRING DIAG: M62.89 (ICD-10-CM) - Pelvic floor dysfunction  THERAPY DIAG:  Pelvic pain  Other low back pain  Urgency incontinence  Stress incontinence (female) (female)  Muscle weakness (generalized)  Other muscle spasm  Unspecified lack of coordination  Rationale for Evaluation and Treatment: Rehabilitation  ONSET DATE: chronic  SUBJECTIVE:                                                                                                                                                                                           SUBJECTIVE STATEMENT: Pt states that she went to the emergency room on 07/24/22 and had a significant stool burden. She still went on trip to [place] and got sick. She started cleanse when she returned from trip and had diarrhea for 3 days. She now is having vaginal irritation again with bleeding - she is likely going to have biopsy to figure out what is going on. She is trying to work on some of the exercises and walk to increase activity overall. She is frustrated because she thought that she was having complete and full evacuations.  PAIN:  Are you having pain? Yes NPRS scale: 4/10 Pain location:  Lower abdominal pain, Lt sided pain, low back pain  Pain type: turning, knotted, pressure Pain description: intermittent and constant   Aggravating factors: constipation or frequent bowel movements/constantly having bowel movements  Relieving factors: exercises (bowel massage, happy baby, spinal twists, walking)  PRECAUTIONS: None  WEIGHT BEARING RESTRICTIONS: No  FALLS:  Has patient fallen in last 6 months? No  LIVING ENVIRONMENT: Lives with: lives with their spouse Lives in: House/apartment  OCCUPATION: nurse, in admin  PLOF: Independent  PATIENT GOALS: decrease pain and have more normal bowel  movements  PERTINENT HISTORY:  Vaginal hysterectomy, DVT LE, endometriosis, interstitial cystitis, exploratory laps for endometriosis, c-section, cholecystectomy Sexual abuse: No  BOWEL MOVEMENT: Pain with bowel movement:  Yes Type of bowel movement:Type (Bristol Stool Scale) 1-7 (sometimes full range in the same bowel movement), Frequency sometimes many times a day, sometimes up to 4 days in between, and Strain Yes Fully empty rectum: No Leakage: No Pads: No Fiber supplement: No - has attempted metamucil and it made constipation worse  URINATION: Pain with urination: Yes Fully empty bladder: No Stream:  varies  Urgency: Yes: all the time Frequency: multiple times an hours  Leakage: Urge to void, Walking to the bathroom, Coughing, Sneezing, Laughing, and Exercise Pads: Yes: daily  INTERCOURSE: Pain with intercourse: Initial Penetration and During Penetration Ability to have vaginal penetration:  Yes: with pain Climax: non painful WNL   PREGNANCY: Vaginal deliveries 1 Tearing Yes: 3rd degree tear C-section deliveries 1 Currently pregnant No  PROLAPSE: Periodically will feel vaginal bulging, heaviness in lower abdomen   OBJECTIVE:  07/21/22: standing prolapse assessment demonstrated grade 2 anterior vaginal wall laxity   06/08/22:               External Perineal Exam: WNL                              Internal Pelvic Floor: burning reported with palpation of superficial muscles; deep aching/pressure with palpation of Lt levator ani   Patient confirms identification and approves PT to assess internal pelvic floor and treatment Yes  PELVIC MMT:   MMT eval  Vaginal 3/5  Internal Anal Sphincter 1/5  External Anal Sphincter 2/5  Puborectalis 1/5  Diastasis Recti   (Blank rows = not tested)        TONE: High in Lt levator ani   PROLAPSE: Not able to tell this session due to dyssynergic pelvic floor contraction     06/01/22: COGNITION: Overall cognitive status:  Within functional limits for tasks assessed     SENSATION: Light touch: Appears intact Proprioception: Appears intact  GAIT: Comments: decreased hip extension, forward flexed trunk  POSTURE: rounded shoulders, forward head, decreased lumbar lordosis, increased thoracic kyphosis, posterior pelvic tilt, and flexed trunk    LUMBARAROM/PROM:  A/PROM A/PROM  Eval (% available)  Flexion 50  Extension 25, pain anteriorly   Right lateral flexion 50, pain anteriorly   Left lateral flexion 50, pain anteriorly    (Blank rows = not tested)   PALPATION:   General  Significant abdominal restriction and tenderness; decreased rib mobility with mobilization and breathing    TODAY'S TREATMENT:                                                                                                                              DATE:  08/11/22 Manual: Abdominal soft tissue mobilization Bowel mobilization   07/20/22 Manual: Pt provides verbal consent for internal vaginal/rectal pelvic floor exam. Bil levator ani pelvic floor muscle release Standing prolapse assessment Exercises: Wide leg lower trunk rotation 12x Piriformis stretch 60 sec bil Hip flexor  stretch 60 sec bil   07/12/22 Manual: Pt provides verbal consent for internal vaginal/rectal pelvic floor exam. Internal pelvic floor muscle release Urethra/bladder mobilization internal/external Exercises: Bridge with pelvic floor contraction 15x Modified thomas stretch 60 sec bil    PATIENT EDUCATION:  Education details: See above Person educated: Patient Education method: Programmer, multimedia, Demonstration, Tactile cues, Verbal cues, and Handouts Education comprehension: verbalized understanding  HOME EXERCISE PROGRAM: [id number]  ASSESSMENT:  CLINICAL IMPRESSION: Pt has had very difficulty couple of weeks with severe pain and finding out that she still has severe stool burden. She had to perform cleanse and has significant amount of  diarrhea. Limited time this session due to late arrival and getting subjective history from the last several weeks. She requested to work on bowel mobilization as this has bene beneficial in getting bowels moving in the past. She demonstrated good tolerance and increased gut noises throughout treatment. She will continue to benefit from skilled PT intervention in order to decrease pain, improve bowel movements, and decrease urinary urgency/incontinence.     OBJECTIVE IMPAIRMENTS: decreased activity tolerance, decreased coordination, decreased endurance, decreased mobility, decreased strength, increased fascial restrictions, increased muscle spasms, impaired tone, postural dysfunction, and pain.   ACTIVITY LIMITATIONS: lifting, bending, continence, and locomotion level  PARTICIPATION LIMITATIONS: interpersonal relationship, community activity, and occupation  PERSONAL FACTORS: 1 comorbidity: medical history  are also affecting patient's functional outcome.   REHAB POTENTIAL: Good  CLINICAL DECISION MAKING: Stable/uncomplicated  EVALUATION COMPLEXITY: Low   GOALS: Goals reviewed with patient? Yes  SHORT TERM GOALS: Target date: 07/06/22 - updated 07/12/22  Pt will be independent with HEP.   Baseline: Goal status: MET 07/12/22  2.  Pt will be independent with diaphragmatic breathing and down training activities in order to improve pelvic floor relaxation.  Baseline:  Goal status: MET 07/12/22  3.  Pt will be independent with the knack, urge suppression technique, and double voiding in order to improve bladder habits and decrease urinary incontinence.   Baseline:  Goal status: IN PROGRESS  4.  Pt will be independent with use of squatty potty, relaxed toileting mechanics, and improved bowel movement techniques in order to increase ease of bowel movements and complete evacuation.   Baseline:  Goal status:IN PROGRESS  5.  Pt will be able to correctly perform diaphragmatic breathing and  appropriate pressure management in order to prevent worsening vaginal wall laxity and improve pelvic floor A/ROM.   Baseline:  Goal status: IN PROGRESS  LONG TERM GOALS: Target date: 11/16/2022 - updated 07/12/22  Pt will be independent with advanced HEP.   Baseline:  Goal status: IN PROGRESS  2.  Pt will demonstrate normal pelvic floor muscle tone and A/ROM, able to achieve 4/5 strength with contractions and 10 sec endurance, in order to provide appropriate lumbopelvic support in functional activities.   Baseline:  Goal status: IN PROGRESS  3.  Pt will increase all impaired lumbar A/ROM by 25% without pain.  Baseline:  Goal status:  IN PROGRESS  4.  Pt will report pain no higher than 3/10 with any activity. Baseline:  Goal status:  IN PROGRESS  5.  Pt will report 0/10 pain with vaginal penetration in order to improve intimate relationship with partner.    Baseline:  Goal status:  IN PROGRESS  6.  Pt will be able to go 2-3 hours in between voids without urgency or incontinence in order to improve QOL and perform all functional activities with less difficulty.   Baseline:  Goal status: IN  PROGRESS  7.  Pt will report no leaks with laughing, coughing, sneezing in order to improve comfort with interpersonal relationships and community activities.   Baseline:  Goal status:  IN PROGRESS  8.  Pt will have consistent bowel movements 4-5x/week without straining or pain.  Baseline:  Goal status:  IN PROGRESS  PLAN:  PT FREQUENCY: 1-2x/week  PT DURATION: 6 months  PLANNED INTERVENTIONS: Therapeutic exercises, Therapeutic activity, Neuromuscular re-education, Balance training, Gait training, Patient/Family education, Self Care, Joint mobilization, Dry Needling, Biofeedback, and Manual therapy  PLAN FOR NEXT SESSION: Begin core training; progress mobility; pelvic floor release.    Julio Alm, PT, DPT08/08/2410:40 PM

## 2022-08-12 ENCOUNTER — Telehealth (HOSPITAL_BASED_OUTPATIENT_CLINIC_OR_DEPARTMENT_OTHER): Payer: Self-pay | Admitting: *Deleted

## 2022-08-12 NOTE — Telephone Encounter (Signed)
Pt called to let us know that the cream she was prescribed has help some but she is still having issues. Pt states that Dr. Hyacinth Meeker wanted to do a biopsy if the symptoms did not resolve. Pt provided with appt

## 2022-08-18 ENCOUNTER — Ambulatory Visit: Payer: Commercial Managed Care - PPO

## 2022-08-18 DIAGNOSIS — R279 Unspecified lack of coordination: Secondary | ICD-10-CM

## 2022-08-18 DIAGNOSIS — R102 Pelvic and perineal pain: Secondary | ICD-10-CM | POA: Diagnosis not present

## 2022-08-18 DIAGNOSIS — N3941 Urge incontinence: Secondary | ICD-10-CM

## 2022-08-18 DIAGNOSIS — M6281 Muscle weakness (generalized): Secondary | ICD-10-CM

## 2022-08-18 DIAGNOSIS — N393 Stress incontinence (female) (male): Secondary | ICD-10-CM

## 2022-08-18 DIAGNOSIS — M5459 Other low back pain: Secondary | ICD-10-CM

## 2022-08-18 DIAGNOSIS — M62838 Other muscle spasm: Secondary | ICD-10-CM

## 2022-08-18 NOTE — Therapy (Signed)
OUTPATIENT PHYSICAL THERAPY FEMALE PELVIC TREATMENT   Patient Name: Karen [name] MRN: [id number] DOB:1970/05/10, 52 y.o., female Today's Date: 08/18/2022  END OF SESSION:  PT End of Session - 08/18/22 1148     Visit Number 9    Date for PT Re-Evaluation 11/16/22    Authorization Type [employer]    PT Start Time 1147    PT Stop Time 1225    PT Time Calculation (min) 38 min    Activity Tolerance Patient tolerated treatment well    Behavior During Therapy [facility] for tasks assessed/performed                  Past Medical History:  Diagnosis Date   Adenomyosis    Anemia    Arthritis 2013   L knee gets steroid injections    Asthmatic bronchitis 01/31/2017   Back pain    Chronic constipation    Diarrhea    DVT of lower extremity (deep venous thrombosis) (HCC)    Dysmenorrhea    Endometriosis    Esophageal stricture    GERD (gastroesophageal reflux disease)    History of kidney stones    History of uterine fibroid    IBS (irritable bowel syndrome)    Interstitial cystitis    Lactose intolerance    Migraine    with aura   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   PONV (postoperative nausea and vomiting)    Recurrent upper respiratory infection (URI)    Sebaceous cyst of breast, right lower inner quadrant 10/25/2012   Excised 11/21/12    Sleep apnea    Syncope, non cardiac    Past Surgical History:  Procedure Laterality Date   ARTHROSCOPIC HAGLUNDS REPAIR     BREAST CYST EXCISION Right 11/21/2012   Procedure: CYST EXCISION BREAST;  Surgeon: Currie Paris, MD;  Location: [facility];  Service: General;  Laterality: Right;   BREAST CYST EXCISION Bilateral 01/18/2022   Procedure: EXCISION OF SEBACEOUS CYST BILATERAL BREAST;  Surgeon: Harriette Bouillon, MD;  Location: [facility];  Service: General;  Laterality: Bilateral;   BREAST EXCISIONAL BIOPSY Right 11/2012   CESAREAN SECTION  04/19/1993   CHOLECYSTECTOMY     COLONOSCOPY   05/02/2011   Procedure: COLONOSCOPY;  Surgeon: Vertell Novak., MD;  Location: [facility];  Service: Endoscopy;  Laterality: N/A;   colonoscopy  11/04/2014   DENTAL SURGERY  03/06/2018   2 surgeries ( 03/06/2018 and 04/06/2018)   to remove two separate benign tumors   ESOPHAGEAL MANOMETRY N/A 11/04/2015   Procedure: ESOPHAGEAL MANOMETRY (EM);  Surgeon: Carman Ching, MD;  Location: [facility];  Service: Endoscopy;  Laterality: N/A;   ESOPHAGOGASTRODUODENOSCOPY  05/02/2011   Procedure: ESOPHAGOGASTRODUODENOSCOPY (EGD);  Surgeon: Vertell Novak., MD;  Location: [facility];  Service: Endoscopy;  Laterality: N/A;   ESOPHAGOGASTRODUODENOSCOPY N/A 09/01/2014   Procedure: ESOPHAGOGASTRODUODENOSCOPY (EGD);  Surgeon: Carman Ching, MD;  Location: [facility];  Service: Endoscopy;  Laterality: N/A;   KNEE SURGERY     LAPAROSCOPY     x 4   LESION REMOVAL N/A 01/18/2022   Procedure: EXCISION OF SEBACEOUS CYSTS CHEST AND NECK;  Surgeon: Harriette Bouillon, MD;  Location: [facility];  Service: General;  Laterality: N/A;   PH IMPEDANCE STUDY N/A 11/04/2015   Procedure: PH IMPEDANCE STUDY;  Surgeon: Carman Ching, MD;  Location: [facility];  Service: Endoscopy;  Laterality: N/A;   VAGINAL HYSTERECTOMY  2010   TVH--ovaries remain   Patient Active  Problem List   Diagnosis Date Noted   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   Elevated LFTs 04/04/2021   Colitis 04/04/2021   Bacterial overgrowth syndrome 05/21/2020   Obesity 05/21/2020   History of endometriosis 05/21/2020   Constipation due to outlet dysfunction 02/05/2020   Gastroesophageal reflux disease 02/05/2020   Obstructive sleep apnea treated with continuous positive airway pressure (CPAP) 06/16/2017   Perennial allergic rhinitis with a nonallergic component 01/31/2017   Asthmatic bronchitis 01/31/2017   GI symptoms 01/31/2017   Family history of colon cancer 01/27/2015   Chest pain 12/13/2012   Dyspnea 12/13/2012   DVT of  lower extremity (deep venous thrombosis) (HCC) 01/03/1990    PCP: Lorenda Ishihara, MD  REFERRING PROVIDER: Jerene Bears, MD   REFERRING DIAG: M62.89 (ICD-10-CM) - Pelvic floor dysfunction  THERAPY DIAG:  Pelvic pain  Other low back pain  Urgency incontinence  Stress incontinence (female) (female)  Muscle weakness (generalized)  Other muscle spasm  Unspecified lack of coordination  Rationale for Evaluation and Treatment: Rehabilitation  ONSET DATE: chronic  SUBJECTIVE:                                                                                                                                                                                           SUBJECTIVE STATEMENT: Pt states that vaginal itching is getting bad again and is from front all the way to the back. She is getting biopsy done next Monday. She is using Miralax 2x/day. She has been working on exercising and walking more regularly.   PAIN:  Are you having pain? Yes NPRS scale: 4/10 Pain location:  Lower abdominal pain, Lt sided pain, low back pain  Pain type: turning, knotted, pressure Pain description: intermittent and constant   Aggravating factors: constipation or frequent bowel movements/constantly having bowel movements  Relieving factors: exercises (bowel massage, happy baby, spinal twists, walking)  PRECAUTIONS: None  WEIGHT BEARING RESTRICTIONS: No  FALLS:  Has patient fallen in last 6 months? No  LIVING ENVIRONMENT: Lives with: lives with their spouse Lives in: House/apartment  OCCUPATION: nurse, in admin  PLOF: Independent  PATIENT GOALS: decrease pain and have more normal bowel movements  PERTINENT HISTORY:  Vaginal hysterectomy, DVT LE, endometriosis, interstitial cystitis, exploratory laps for endometriosis, c-section, cholecystectomy Sexual abuse: No  BOWEL MOVEMENT: Pain with bowel movement: Yes Type of bowel movement:Type (Bristol Stool Scale) 1-7 (sometimes full  range in the same bowel movement), Frequency sometimes many times a day, sometimes up to 4 days in between, and Strain Yes Fully empty rectum: No Leakage: No Pads: No Fiber supplement: No - has attempted metamucil and it made constipation worse  URINATION: Pain with urination: Yes Fully empty bladder: No Stream:  varies  Urgency: Yes: all the time Frequency: multiple times an hours  Leakage: Urge to void, Walking to the bathroom, Coughing, Sneezing, Laughing, and Exercise Pads: Yes: daily  INTERCOURSE: Pain with intercourse: Initial Penetration and During Penetration Ability to have vaginal penetration:  Yes: with pain Climax: non painful WNL   PREGNANCY: Vaginal deliveries 1 Tearing Yes: 3rd degree tear C-section deliveries 1 Currently pregnant No  PROLAPSE: Periodically will feel vaginal bulging, heaviness in lower abdomen   OBJECTIVE:  07/21/22: standing prolapse assessment demonstrated grade 2 anterior vaginal wall laxity   06/08/22:               External Perineal Exam: WNL                              Internal Pelvic Floor: burning reported with palpation of superficial muscles; deep aching/pressure with palpation of Lt levator ani   Patient confirms identification and approves PT to assess internal pelvic floor and treatment Yes  PELVIC MMT:   MMT eval  Vaginal 3/5  Internal Anal Sphincter 1/5  External Anal Sphincter 2/5  Puborectalis 1/5  Diastasis Recti   (Blank rows = not tested)        TONE: High in Lt levator ani   PROLAPSE: Not able to tell this session due to dyssynergic pelvic floor contraction     06/01/22: COGNITION: Overall cognitive status: Within functional limits for tasks assessed     SENSATION: Light touch: Appears intact Proprioception: Appears intact  GAIT: Comments: decreased hip extension, forward flexed trunk  POSTURE: rounded shoulders, forward head, decreased lumbar lordosis, increased thoracic kyphosis, posterior pelvic  tilt, and flexed trunk    LUMBARAROM/PROM:  A/PROM A/PROM  Eval (% available)  Flexion 50  Extension 25, pain anteriorly   Right lateral flexion 50, pain anteriorly   Left lateral flexion 50, pain anteriorly    (Blank rows = not tested)   PALPATION:   General  Significant abdominal restriction and tenderness; decreased rib mobility with mobilization and breathing    TODAY'S TREATMENT:                                                                                                                              DATE:  08/18/22 Neuromuscular re-education: Transversus abdominus training with multimodal cues for improved motor control and breath coordination pelvic tilts seated with breathing/core activation 10x Seated UE ball press with core activation 10x Seated unilateral ball press at side 10x bil Exercises: Seated open books 10x bil Seated thoracic openers 5x bil Seated horizontal abduction with core activation red band 2 x 10   08/11/22 Manual: Abdominal soft tissue mobilization Bowel mobilization   07/20/22 Manual: Pt provides verbal consent for internal vaginal/rectal pelvic floor exam. Bil levator ani pelvic floor muscle release Standing prolapse assessment Exercises: Wide leg lower trunk  rotation 12x Piriformis stretch 60 sec bil Hip flexor stretch 60 sec bil     PATIENT EDUCATION:  Education details: See above Person educated: Patient Education method: Explanation, Demonstration, Tactile cues, Verbal cues, and Handouts Education comprehension: verbalized understanding  HOME EXERCISE PROGRAM: [email or web address]  ASSESSMENT:  CLINICAL IMPRESSION: Pt was able to begin core training today. She had significant cramping in mid back, likely due to abdominal activation attempting to provide some postural correction to kyphotic spine that was resistant. She was able to begin doing some postural strengthening in order to open up anterior chain, decrease abdominal pressure,  and improve motility. Abdominal training performed in seated to help improve comfort at this time. She did very well wit hall activities. She will continue to benefit from skilled PT intervention in order to decrease pain, improve bowel movements, and decrease urinary urgency/incontinence.     OBJECTIVE IMPAIRMENTS: decreased activity tolerance, decreased coordination, decreased endurance, decreased mobility, decreased strength, increased fascial restrictions, increased muscle spasms, impaired tone, postural dysfunction, and pain.   ACTIVITY LIMITATIONS: lifting, bending, continence, and locomotion level  PARTICIPATION LIMITATIONS: interpersonal relationship, community activity, and occupation  PERSONAL FACTORS: 1 comorbidity: medical history  are also affecting patient's functional outcome.   REHAB POTENTIAL: Good  CLINICAL DECISION MAKING: Stable/uncomplicated  EVALUATION COMPLEXITY: Low   GOALS: Goals reviewed with patient? Yes  SHORT TERM GOALS: Target date: 07/06/22 - updated 07/12/22 - updated 08/18/22  Pt will be independent with HEP.   Baseline: Goal status: MET 07/12/22  2.  Pt will be independent with diaphragmatic breathing and down training activities in order to improve pelvic floor relaxation.  Baseline:  Goal status: MET 07/12/22  3.  Pt will be independent with the knack, urge suppression technique, and double voiding in order to improve bladder habits and decrease urinary incontinence.   Baseline:  Goal status: IN PROGRESS  4.  Pt will be independent with use of squatty potty, relaxed toileting mechanics, and improved bowel movement techniques in order to increase ease of bowel movements and complete evacuation.   Baseline:  Goal status:IN PROGRESS  5.  Pt will be able to correctly perform diaphragmatic breathing and appropriate pressure management in order to prevent worsening vaginal wall laxity and improve pelvic floor A/ROM.   Baseline:  Goal status: IN  PROGRESS  LONG TERM GOALS: Target date: 11/16/2022 - updated 07/12/22 - updated 08/18/22  Pt will be independent with advanced HEP.   Baseline:  Goal status: IN PROGRESS  2.  Pt will demonstrate normal pelvic floor muscle tone and A/ROM, able to achieve 4/5 strength with contractions and 10 sec endurance, in order to provide appropriate lumbopelvic support in functional activities.   Baseline:  Goal status: IN PROGRESS  3.  Pt will increase all impaired lumbar A/ROM by 25% without pain.  Baseline:  Goal status:  IN PROGRESS  4.  Pt will report pain no higher than 3/10 with any activity. Baseline:  Goal status:  IN PROGRESS  5.  Pt will report 0/10 pain with vaginal penetration in order to improve intimate relationship with partner.    Baseline:  Goal status:  IN PROGRESS  6.  Pt will be able to go 2-3 hours in between voids without urgency or incontinence in order to improve QOL and perform all functional activities with less difficulty.   Baseline:  Goal status: IN PROGRESS  7.  Pt will report no leaks with laughing, coughing, sneezing in order to improve comfort with interpersonal relationships and community activities.  Baseline:  Goal status:  IN PROGRESS  8.  Pt will have consistent bowel movements 4-5x/week without straining or pain.  Baseline:  Goal status:  IN PROGRESS  PLAN:  PT FREQUENCY: 1-2x/week  PT DURATION: 6 months  PLANNED INTERVENTIONS: Therapeutic exercises, Therapeutic activity, Neuromuscular re-education, Balance training, Gait training, Patient/Family education, Self Care, Joint mobilization, Dry Needling, Biofeedback, and Manual therapy  PLAN FOR NEXT SESSION: Progress core and postural strengthening; no pelvic floor exam until we have results of biopsy.    Julio Alm, PT, DPT08/15/2412:28 PM

## 2022-08-22 ENCOUNTER — Other Ambulatory Visit (HOSPITAL_COMMUNITY)
Admission: RE | Admit: 2022-08-22 | Discharge: 2022-08-22 | Disposition: A | Discharge status: Home / Self Care | Payer: Commercial Managed Care - PPO | Source: Ambulatory Visit | Attending: Obstetrics & Gynecology | Admitting: Obstetrics & Gynecology

## 2022-08-22 ENCOUNTER — Ambulatory Visit (INDEPENDENT_AMBULATORY_CARE_PROVIDER_SITE_OTHER): Payer: Commercial Managed Care - PPO | Admitting: Obstetrics & Gynecology

## 2022-08-22 ENCOUNTER — Encounter (HOSPITAL_BASED_OUTPATIENT_CLINIC_OR_DEPARTMENT_OTHER): Payer: Self-pay | Admitting: Obstetrics & Gynecology

## 2022-08-22 VITALS — BP 123/92 | HR 72 | Ht 67.5 in | Wt 207.8 lb

## 2022-08-22 DIAGNOSIS — L292 Pruritus vulvae: Secondary | ICD-10-CM | POA: Insufficient documentation

## 2022-08-22 DIAGNOSIS — L859 Epidermal thickening, unspecified: Secondary | ICD-10-CM

## 2022-08-22 DIAGNOSIS — N762 Acute vulvitis: Secondary | ICD-10-CM | POA: Diagnosis not present

## 2022-08-22 NOTE — Progress Notes (Signed)
GYNECOLOGY  VISIT  CC:   vulvar irritation  HPI: 52 y.o. G2P2 Married [place] American female here for follow up after being seen in July for vulvar irritation.  She had some mild labial thickening but no other changes.  She was started on topical steroid and it did help but she stopped after about 10 days.  She really does want to know the cause of her symptoms.   Since that time, she's had some additional issues with diarrhea after doing a colon cleanse (due to larger stool burden seen with CT scan).  CT and lab work from ER visit reviewed.  The looser stool has caused a lot of vulvar/perianal irritation.  She is using aquaphor.    Has GI follow up [facility]) in October.     Past Medical History:  Diagnosis Date   Adenomyosis    Anemia    Arthritis 2013   L knee gets steroid injections    Asthmatic bronchitis 01/31/2017   Back pain    Chronic constipation    Diarrhea    DVT of lower extremity (deep venous thrombosis) (HCC)    Dysmenorrhea    Endometriosis    Esophageal stricture    GERD (gastroesophageal reflux disease)    History of kidney stones    History of uterine fibroid    IBS (irritable bowel syndrome)    Interstitial cystitis    Lactose intolerance    Migraine    with aura   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   PONV (postoperative nausea and vomiting)    Recurrent upper respiratory infection (URI)    Sebaceous cyst of breast, right lower inner quadrant 10/25/2012   Excised 11/21/12    Sleep apnea    Syncope, non cardiac     MEDS:   Current Outpatient Medications on File Prior to Visit  Medication Sig Dispense Refill   Acetaminophen 500 MG capsule 1 capsule as needed Orally every 6 hrs     albuterol (VENTOLIN HFA) 108 (90 Base) MCG/ACT inhaler Inhale 2 puffs into the lungs every 6 (six) hours as needed for wheezing or shortness of breath. 6.7 g 2   clobetasol ointment (TEMOVATE) 0.05 % Apply 1 Application topically as directed 2 (two) times daily. 60 g 0    Elderberry 575 MG/5ML SYRP Take 30 mLs by mouth daily.     Lactobacillus Casei-Folic Acid (RESTORA RX) 60-1.25 MG CAPS Take 1 capsule by mouth daily between meals 30 capsule 5   lansoprazole (PREVACID) 30 MG capsule Take 1 capsule (30 mg total) by mouth daily. 90 capsule 3   levocetirizine (XYZAL) 5 MG tablet Take 1 tablet (5 mg total) by mouth every evening. (Patient taking differently: Take 5 mg by mouth daily as needed for allergies.) 30 tablet 5   Lifitegrast (XIIDRA) 5 % SOLN Place 1 drop into both eyes 2 (two) times daily. 120 each 0   ondansetron (ZOFRAN-ODT) 4 MG disintegrating tablet Dissolve 1 tablet (4 mg total) in mouth every 8 (eight) hours as needed for nausea 30 tablet 2   polyethylene glycol (MIRALAX / GLYCOLAX) 17 g packet Take 17 g by mouth every other day.     lansoprazole (PREVACID) 15 MG capsule Take 1 capsule (15 mg total) by mouth daily. (Patient not taking: Reported on 07/11/2022) 90 capsule 2   No current facility-administered medications on file prior to visit.    ALLERGIES: Molds & smuts  SH:  married, non smoker  Review of Systems  Constitutional: Negative.  Genitourinary:        Vulvar itching, irritation    PHYSICAL EXAMINATION:    BP (!) 123/92 (BP Location: Right Arm, Patient Position: Sitting, Cuff Size: Large)   Pulse 72   Ht 5' 7.5" (1.715 m) Comment: Reported  Wt 207 lb 12.8 oz (94.3 kg)   BMI 32.07 kg/m     General appearance: alert, cooperative and appears stated age Lymph:  no inguinal LAD noted  Pelvic: External genitalia:  no discrete pigmented lesions, there is thickening and dulling of appearance of skin on the left labia majora where she is having the most symptoms              Urethra:  normal appearing urethra with no masses, tenderness or lesions              Bartholins and Skenes: normal                 Vagina: normal appearing vagina with normal color and discharge, no lesions             Given persistent symptoms, vulvar biopsy  recommended.  Pt decided to proceed today.  Procedure:  Area cleansed with Betadine.  Sterile technique used throughout procedure.  Skin anesthestized with Lidocaine 1% plain; 2.61mL.Marland Kitchen  4 punch biopsy used to obtain specimen.  Biopsy grasped with pick-ups and excised with scissors.  Adequate hemostasis obtained with silver nitrate sticks.  Dressing was not applied.  Pt tolerated procedure well.  Chaperone, Ina Homes, CMA, was present for exam.  Assessment/Plan: 1. Vulvar itching - Surgical pathology( [facility]/ [facility]) from biopsy sent to pathology.  Results will be called to pt. - triple antibiotic ointment to be applied topically at least twice daily.  Sample provided to pt to use.

## 2022-08-25 ENCOUNTER — Ambulatory Visit: Payer: Commercial Managed Care - PPO

## 2022-08-25 DIAGNOSIS — N3941 Urge incontinence: Secondary | ICD-10-CM | POA: Diagnosis not present

## 2022-08-25 DIAGNOSIS — R102 Pelvic and perineal pain: Secondary | ICD-10-CM | POA: Diagnosis not present

## 2022-08-25 DIAGNOSIS — R279 Unspecified lack of coordination: Secondary | ICD-10-CM | POA: Diagnosis not present

## 2022-08-25 DIAGNOSIS — M5459 Other low back pain: Secondary | ICD-10-CM

## 2022-08-25 DIAGNOSIS — M62838 Other muscle spasm: Secondary | ICD-10-CM | POA: Diagnosis not present

## 2022-08-25 DIAGNOSIS — M6281 Muscle weakness (generalized): Secondary | ICD-10-CM

## 2022-08-25 DIAGNOSIS — N393 Stress incontinence (female) (male): Secondary | ICD-10-CM

## 2022-08-25 NOTE — Therapy (Signed)
OUTPATIENT PHYSICAL THERAPY FEMALE PELVIC TREATMENT   Patient Name: Karen [name] MRN: [id number] DOB:[birth year], 52 y.o., female Today's Date: 08/25/2022  END OF SESSION:  PT End of Session - 08/25/22 1148     Visit Number 10    Date for PT Re-Evaluation 11/16/22    Authorization Type [employer]    PT Start Time 1147    PT Stop Time 1227    PT Time Calculation (min) 40 min    Activity Tolerance Patient tolerated treatment well    Behavior During Therapy [facility] for tasks assessed/performed                  Past Medical History:  Diagnosis Date   Adenomyosis    Anemia    Arthritis 2013   L knee gets steroid injections    Asthmatic bronchitis 01/31/2017   Back pain    Chronic constipation    Diarrhea    DVT of lower extremity (deep venous thrombosis) (HCC)    Dysmenorrhea    Endometriosis    Esophageal stricture    GERD (gastroesophageal reflux disease)    History of kidney stones    History of uterine fibroid    IBS (irritable bowel syndrome)    Interstitial cystitis    Lactose intolerance    Migraine    with aura   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   PONV (postoperative nausea and vomiting)    Recurrent upper respiratory infection (URI)    Sebaceous cyst of breast, right lower inner quadrant 10/25/2012   Excised 11/21/12    Sleep apnea    Syncope, non cardiac    Past Surgical History:  Procedure Laterality Date   ARTHROSCOPIC HAGLUNDS REPAIR     BREAST CYST EXCISION Right 11/21/2012   Procedure: CYST EXCISION BREAST;  Surgeon: Currie Paris, MD;  Location: [facility];  Service: General;  Laterality: Right;   BREAST CYST EXCISION Bilateral 01/18/2022   Procedure: EXCISION OF SEBACEOUS CYST BILATERAL BREAST;  Surgeon: Harriette Bouillon, MD;  Location: [facility];  Service: General;  Laterality: Bilateral;   BREAST EXCISIONAL BIOPSY Right 11/2012   CESAREAN SECTION  04/19/1993   CHOLECYSTECTOMY     COLONOSCOPY   05/02/2011   Procedure: COLONOSCOPY;  Surgeon: Vertell Novak., MD;  Location: [facility];  Service: Endoscopy;  Laterality: N/A;   colonoscopy  11/04/2014   DENTAL SURGERY  03/06/2018   2 surgeries ( 03/06/2018 and 04/06/2018)   to remove two separate benign tumors   ESOPHAGEAL MANOMETRY N/A 11/04/2015   Procedure: ESOPHAGEAL MANOMETRY (EM);  Surgeon: Carman Ching, MD;  Location: [facility];  Service: Endoscopy;  Laterality: N/A;   ESOPHAGOGASTRODUODENOSCOPY  05/02/2011   Procedure: ESOPHAGOGASTRODUODENOSCOPY (EGD);  Surgeon: Vertell Novak., MD;  Location: [facility];  Service: Endoscopy;  Laterality: N/A;   ESOPHAGOGASTRODUODENOSCOPY N/A 09/01/2014   Procedure: ESOPHAGOGASTRODUODENOSCOPY (EGD);  Surgeon: Carman Ching, MD;  Location: [facility];  Service: Endoscopy;  Laterality: N/A;   KNEE SURGERY     LAPAROSCOPY     x 4   LESION REMOVAL N/A 01/18/2022   Procedure: EXCISION OF SEBACEOUS CYSTS CHEST AND NECK;  Surgeon: Harriette Bouillon, MD;  Location: [facility];  Service: General;  Laterality: N/A;   PH IMPEDANCE STUDY N/A 11/04/2015   Procedure: PH IMPEDANCE STUDY;  Surgeon: Carman Ching, MD;  Location: [facility];  Service: Endoscopy;  Laterality: N/A;   VAGINAL HYSTERECTOMY  2010   TVH--ovaries remain   Patient Active  Problem List   Diagnosis Date Noted   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   Elevated LFTs 04/04/2021   Colitis 04/04/2021   Bacterial overgrowth syndrome 05/21/2020   Obesity 05/21/2020   History of endometriosis 05/21/2020   Constipation due to outlet dysfunction 02/05/2020   Gastroesophageal reflux disease 02/05/2020   Obstructive sleep apnea treated with continuous positive airway pressure (CPAP) 06/16/2017   Perennial allergic rhinitis with a nonallergic component 01/31/2017   Asthmatic bronchitis 01/31/2017   GI symptoms 01/31/2017   Family history of colon cancer 01/27/2015   Chest pain 12/13/2012   Dyspnea 12/13/2012   DVT of  lower extremity (deep venous thrombosis) (HCC) 01/03/1990    PCP: Lorenda Ishihara, MD  REFERRING PROVIDER: Jerene Bears, MD   REFERRING DIAG: M62.89 (ICD-10-CM) - Pelvic floor dysfunction  THERAPY DIAG:  Pelvic pain  Other low back pain  Urgency incontinence  Stress incontinence (female) (female)  Muscle weakness (generalized)  Other muscle spasm  Unspecified lack of coordination  Rationale for Evaluation and Treatment: Rehabilitation  ONSET DATE: chronic  SUBJECTIVE:                                                                                                                                                                                           SUBJECTIVE STATEMENT: Pt states that she had vulvar biopsy Monday, but no results yet. MD has found anal fissure and is now using ointment on this. She is having bowel movements and has not increased Miralax more than 2x/day. She is starting to feel a little heaviness again which is normally a sign of constipation. She is now going to the bathroom 4-5x/day.   PAIN:  Are you having pain? Yes NPRS scale: 4/10 Pain location:  Lower abdominal pain, Lt sided pain, low back pain  Pain type: turning, knotted, pressure Pain description: intermittent and constant   Aggravating factors: constipation or frequent bowel movements/constantly having bowel movements  Relieving factors: exercises (bowel massage, happy baby, spinal twists, walking)  PRECAUTIONS: None  WEIGHT BEARING RESTRICTIONS: No  FALLS:  Has patient fallen in last 6 months? No  LIVING ENVIRONMENT: Lives with: lives with their spouse Lives in: House/apartment  OCCUPATION: nurse, in admin  PLOF: Independent  PATIENT GOALS: decrease pain and have more normal bowel movements  PERTINENT HISTORY:  Vaginal hysterectomy, DVT LE, endometriosis, interstitial cystitis, exploratory laps for endometriosis, c-section, cholecystectomy Sexual abuse: No  BOWEL  MOVEMENT: Pain with bowel movement: Yes Type of bowel movement:Type (Bristol Stool Scale) 1-7 (sometimes full range in the same bowel movement), Frequency sometimes many times a day, sometimes up to 4 days in between, and Strain Yes  Fully empty rectum: No Leakage: No Pads: No Fiber supplement: No - has attempted metamucil and it made constipation worse  URINATION: Pain with urination: Yes Fully empty bladder: No Stream:  varies  Urgency: Yes: all the time Frequency: multiple times an hours  Leakage: Urge to void, Walking to the bathroom, Coughing, Sneezing, Laughing, and Exercise Pads: Yes: daily  INTERCOURSE: Pain with intercourse: Initial Penetration and During Penetration Ability to have vaginal penetration:  Yes: with pain Climax: non painful WNL   PREGNANCY: Vaginal deliveries 1 Tearing Yes: 3rd degree tear C-section deliveries 1 Currently pregnant No  PROLAPSE: Periodically will feel vaginal bulging, heaviness in lower abdomen   OBJECTIVE:  07/21/22: standing prolapse assessment demonstrated grade 2 anterior vaginal wall laxity   06/08/22:               External Perineal Exam: WNL                              Internal Pelvic Floor: burning reported with palpation of superficial muscles; deep aching/pressure with palpation of Lt levator ani   Patient confirms identification and approves PT to assess internal pelvic floor and treatment Yes  PELVIC MMT:   MMT eval  Vaginal 3/5  Internal Anal Sphincter 1/5  External Anal Sphincter 2/5  Puborectalis 1/5  Diastasis Recti   (Blank rows = not tested)        TONE: High in Lt levator ani   PROLAPSE: Not able to tell this session due to dyssynergic pelvic floor contraction     06/01/22: COGNITION: Overall cognitive status: Within functional limits for tasks assessed     SENSATION: Light touch: Appears intact Proprioception: Appears intact  GAIT: Comments: decreased hip extension, forward flexed  trunk  POSTURE: rounded shoulders, forward head, decreased lumbar lordosis, increased thoracic kyphosis, posterior pelvic tilt, and flexed trunk    LUMBARAROM/PROM:  A/PROM A/PROM  Eval (% available)  Flexion 50  Extension 25, pain anteriorly   Right lateral flexion 50, pain anteriorly   Left lateral flexion 50, pain anteriorly    (Blank rows = not tested)   PALPATION:   General  Significant abdominal restriction and tenderness; decreased rib mobility with mobilization and breathing    TODAY'S TREATMENT:                                                                                                                              DATE:  08/25/22 Manual: Trigger Point Dry-Needling  Treatment instructions: Expect mild to moderate muscle soreness. S/S of pneumothorax if dry needled over a lung field, and to seek immediate medical attention should they occur. Patient verbalized understanding of these instructions and education.  Patient Consent Given: No Education handout provided: Yes Muscles treated: Multifidi T12-L5, bil glutes/piriformis Electrical stimulation performed: No Parameters: N/A Treatment response/outcome: twitch response/release Soft tissue mobilization to lumbar paraspinals Abdominal soft tissue mobilization Bowel mobilization  08/18/22 Neuromuscular re-education: Transversus abdominus training with multimodal cues for improved motor control and breath coordination pelvic tilts seated with breathing/core activation 10x Seated UE ball press with core activation 10x Seated unilateral ball press at side 10x bil Exercises: Seated open books 10x bil Seated thoracic openers 5x bil Seated horizontal abduction with core activation red band 2 x 10   08/11/22 Manual: Abdominal soft tissue mobilization Bowel mobilization  PATIENT EDUCATION:  Education details: See above Person educated: Patient Education method: Explanation, Demonstration, Tactile cues, Verbal  cues, and Handouts Education comprehension: verbalized understanding  HOME EXERCISE PROGRAM: [id number]  ASSESSMENT:  CLINICAL IMPRESSION: Pt feeling like she is starting to get constipated again due to pressure in low back/abdomen. She is working very hard on being more active. Due to possibility of low back restriction still playing roll in bowel motility, dry needling and manual techniques performed to thoracolumbar junction and low back with excellent tolerance. Bowel/abdomen mobilization performed with improved gut scan. She will continue to benefit from skilled PT intervention in order to decrease pain, improve bowel movements, and decrease urinary urgency/incontinence.     OBJECTIVE IMPAIRMENTS: decreased activity tolerance, decreased coordination, decreased endurance, decreased mobility, decreased strength, increased fascial restrictions, increased muscle spasms, impaired tone, postural dysfunction, and pain.   ACTIVITY LIMITATIONS: lifting, bending, continence, and locomotion level  PARTICIPATION LIMITATIONS: interpersonal relationship, community activity, and occupation  PERSONAL FACTORS: 1 comorbidity: medical history  are also affecting patient's functional outcome.   REHAB POTENTIAL: Good  CLINICAL DECISION MAKING: Stable/uncomplicated  EVALUATION COMPLEXITY: Low   GOALS: Goals reviewed with patient? Yes  SHORT TERM GOALS: Target date: 07/06/22 - updated 07/12/22 - updated 08/18/22  Pt will be independent with HEP.   Baseline: Goal status: MET 07/12/22  2.  Pt will be independent with diaphragmatic breathing and down training activities in order to improve pelvic floor relaxation.  Baseline:  Goal status: MET 07/12/22  3.  Pt will be independent with the knack, urge suppression technique, and double voiding in order to improve bladder habits and decrease urinary incontinence.   Baseline:  Goal status: IN PROGRESS  4.  Pt will be independent with use of squatty potty,  relaxed toileting mechanics, and improved bowel movement techniques in order to increase ease of bowel movements and complete evacuation.   Baseline:  Goal status:IN PROGRESS  5.  Pt will be able to correctly perform diaphragmatic breathing and appropriate pressure management in order to prevent worsening vaginal wall laxity and improve pelvic floor A/ROM.   Baseline:  Goal status: IN PROGRESS  LONG TERM GOALS: Target date: 11/16/2022 - updated 07/12/22 - updated 08/18/22  Pt will be independent with advanced HEP.   Baseline:  Goal status: IN PROGRESS  2.  Pt will demonstrate normal pelvic floor muscle tone and A/ROM, able to achieve 4/5 strength with contractions and 10 sec endurance, in order to provide appropriate lumbopelvic support in functional activities.   Baseline:  Goal status: IN PROGRESS  3.  Pt will increase all impaired lumbar A/ROM by 25% without pain.  Baseline:  Goal status:  IN PROGRESS  4.  Pt will report pain no higher than 3/10 with any activity. Baseline:  Goal status:  IN PROGRESS  5.  Pt will report 0/10 pain with vaginal penetration in order to improve intimate relationship with partner.    Baseline:  Goal status:  IN PROGRESS  6.  Pt will be able to go 2-3 hours in between voids without urgency or incontinence in order  to improve QOL and perform all functional activities with less difficulty.   Baseline:  Goal status: IN PROGRESS  7.  Pt will report no leaks with laughing, coughing, sneezing in order to improve comfort with interpersonal relationships and community activities.   Baseline:  Goal status:  IN PROGRESS  8.  Pt will have consistent bowel movements 4-5x/week without straining or pain.  Baseline:  Goal status:  IN PROGRESS  PLAN:  PT FREQUENCY: 1-2x/week  PT DURATION: 6 months  PLANNED INTERVENTIONS: Therapeutic exercises, Therapeutic activity, Neuromuscular re-education, Balance training, Gait training, Patient/Family  education, Self Care, Joint mobilization, Dry Needling, Biofeedback, and Manual therapy  PLAN FOR NEXT SESSION: Progress core and postural strengthening; no pelvic floor exam until we have results of biopsy.    Julio Alm, PT, DPT08/22/2412:28 PM

## 2022-08-26 ENCOUNTER — Other Ambulatory Visit (HOSPITAL_COMMUNITY): Payer: Self-pay

## 2022-08-31 LAB — SURGICAL PATHOLOGY

## 2022-09-01 ENCOUNTER — Ambulatory Visit: Payer: Commercial Managed Care - PPO

## 2022-09-01 DIAGNOSIS — N393 Stress incontinence (female) (male): Secondary | ICD-10-CM | POA: Diagnosis not present

## 2022-09-01 DIAGNOSIS — M62838 Other muscle spasm: Secondary | ICD-10-CM | POA: Diagnosis not present

## 2022-09-01 DIAGNOSIS — N3941 Urge incontinence: Secondary | ICD-10-CM

## 2022-09-01 DIAGNOSIS — R102 Pelvic and perineal pain: Secondary | ICD-10-CM

## 2022-09-01 DIAGNOSIS — R279 Unspecified lack of coordination: Secondary | ICD-10-CM | POA: Diagnosis not present

## 2022-09-01 DIAGNOSIS — M6281 Muscle weakness (generalized): Secondary | ICD-10-CM

## 2022-09-01 DIAGNOSIS — M5459 Other low back pain: Secondary | ICD-10-CM

## 2022-09-01 NOTE — Therapy (Signed)
OUTPATIENT PHYSICAL THERAPY FEMALE PELVIC TREATMENT   Patient Name: Karen [name] MRN: [id number] DOB:09/12/1970, 52 y.o., female Today's Date: 09/01/2022  END OF SESSION:  PT End of Session - 09/01/22 1153     Visit Number 11    Date for PT Re-Evaluation 11/16/22    Authorization Type [employer]    PT Start Time 1151    PT Stop Time 1229    PT Time Calculation (min) 38 min    Activity Tolerance Patient tolerated treatment well    Behavior During Therapy [facility] for tasks assessed/performed                  Past Medical History:  Diagnosis Date   Adenomyosis    Anemia    Arthritis 2013   L knee gets steroid injections    Asthmatic bronchitis 01/31/2017   Back pain    Chronic constipation    Diarrhea    DVT of lower extremity (deep venous thrombosis) (HCC)    Dysmenorrhea    Endometriosis    Esophageal stricture    GERD (gastroesophageal reflux disease)    History of kidney stones    History of uterine fibroid    IBS (irritable bowel syndrome)    Interstitial cystitis    Lactose intolerance    Migraine    with aura   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   PONV (postoperative nausea and vomiting)    Recurrent upper respiratory infection (URI)    Sebaceous cyst of breast, right lower inner quadrant 10/25/2012   Excised 11/21/12    Sleep apnea    Syncope, non cardiac    Past Surgical History:  Procedure Laterality Date   ARTHROSCOPIC HAGLUNDS REPAIR     BREAST CYST EXCISION Right 11/21/2012   Procedure: CYST EXCISION BREAST;  Surgeon: Currie Paris, MD;  Location: [facility];  Service: General;  Laterality: Right;   BREAST CYST EXCISION Bilateral 01/18/2022   Procedure: EXCISION OF SEBACEOUS CYST BILATERAL BREAST;  Surgeon: Harriette Bouillon, MD;  Location: [facility];  Service: General;  Laterality: Bilateral;   BREAST EXCISIONAL BIOPSY Right 11/2012   CESAREAN SECTION  04/19/1993   CHOLECYSTECTOMY     COLONOSCOPY   05/02/2011   Procedure: COLONOSCOPY;  Surgeon: Vertell Novak., MD;  Location: [facility];  Service: Endoscopy;  Laterality: N/A;   colonoscopy  11/04/2014   DENTAL SURGERY  03/06/2018   2 surgeries ( 03/06/2018 and 04/06/2018)   to remove two separate benign tumors   ESOPHAGEAL MANOMETRY N/A 11/04/2015   Procedure: ESOPHAGEAL MANOMETRY (EM);  Surgeon: Carman Ching, MD;  Location: [facility];  Service: Endoscopy;  Laterality: N/A;   ESOPHAGOGASTRODUODENOSCOPY  05/02/2011   Procedure: ESOPHAGOGASTRODUODENOSCOPY (EGD);  Surgeon: Vertell Novak., MD;  Location: [facility];  Service: Endoscopy;  Laterality: N/A;   ESOPHAGOGASTRODUODENOSCOPY N/A 09/01/2014   Procedure: ESOPHAGOGASTRODUODENOSCOPY (EGD);  Surgeon: Carman Ching, MD;  Location: [facility];  Service: Endoscopy;  Laterality: N/A;   KNEE SURGERY     LAPAROSCOPY     x 4   LESION REMOVAL N/A 01/18/2022   Procedure: EXCISION OF SEBACEOUS CYSTS CHEST AND NECK;  Surgeon: Harriette Bouillon, MD;  Location: [facility];  Service: General;  Laterality: N/A;   PH IMPEDANCE STUDY N/A 11/04/2015   Procedure: PH IMPEDANCE STUDY;  Surgeon: Carman Ching, MD;  Location: [facility];  Service: Endoscopy;  Laterality: N/A;   VAGINAL HYSTERECTOMY  2010   TVH--ovaries remain   Patient Active  Problem List   Diagnosis Date Noted   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   Elevated LFTs 04/04/2021   Colitis 04/04/2021   Bacterial overgrowth syndrome 05/21/2020   Obesity 05/21/2020   History of endometriosis 05/21/2020   Constipation due to outlet dysfunction 02/05/2020   Gastroesophageal reflux disease 02/05/2020   Obstructive sleep apnea treated with continuous positive airway pressure (CPAP) 06/16/2017   Perennial allergic rhinitis with a nonallergic component 01/31/2017   Asthmatic bronchitis 01/31/2017   GI symptoms 01/31/2017   Family history of colon cancer 01/27/2015   Chest pain 12/13/2012   Dyspnea 12/13/2012   DVT of  lower extremity (deep venous thrombosis) (HCC) 01/03/1990    PCP: Lorenda Ishihara, MD  REFERRING PROVIDER: Jerene Bears, MD   REFERRING DIAG: M62.89 (ICD-10-CM) - Pelvic floor dysfunction  THERAPY DIAG:  Pelvic pain  Other low back pain  Urgency incontinence  Stress incontinence (female) (female)  Muscle weakness (generalized)  Other muscle spasm  Unspecified lack of coordination  Rationale for Evaluation and Treatment: Rehabilitation  ONSET DATE: chronic  SUBJECTIVE:                                                                                                                                                                                           SUBJECTIVE STATEMENT: Pt has gotten some of her biopsy results back and there is no cancer. However, they are running more tests to see if she may be having medication reaction in the tissue. She is having about 6 BMs a day. Her anal fissures are getting worse. She is still doing the Miralax 2x/day. She is using wet wipes to clean. She states that she was sore after DN, but she did well; she did not feel like there was any real change.   PAIN:  Are you having pain? Yes NPRS scale: 4/10 Pain location:  Lower abdominal pain, Lt sided pain, low back pain  Pain type: turning, knotted, pressure Pain description: intermittent and constant   Aggravating factors: constipation or frequent bowel movements/constantly having bowel movements  Relieving factors: exercises (bowel massage, happy baby, spinal twists, walking)  PRECAUTIONS: None  WEIGHT BEARING RESTRICTIONS: No  FALLS:  Has patient fallen in last 6 months? No  LIVING ENVIRONMENT: Lives with: lives with their spouse Lives in: House/apartment  OCCUPATION: nurse, in admin  PLOF: Independent  PATIENT GOALS: decrease pain and have more normal bowel movements  PERTINENT HISTORY:  Vaginal hysterectomy, DVT LE, endometriosis, interstitial cystitis, exploratory  laps for endometriosis, c-section, cholecystectomy Sexual abuse: No  BOWEL MOVEMENT: Pain with bowel movement: Yes Type of bowel movement:Type (Bristol Stool Scale) 1-7 (sometimes full range  in the same bowel movement), Frequency sometimes many times a day, sometimes up to 4 days in between, and Strain Yes Fully empty rectum: No Leakage: No Pads: No Fiber supplement: No - has attempted metamucil and it made constipation worse  URINATION: Pain with urination: Yes Fully empty bladder: No Stream:  varies  Urgency: Yes: all the time Frequency: multiple times an hours  Leakage: Urge to void, Walking to the bathroom, Coughing, Sneezing, Laughing, and Exercise Pads: Yes: daily  INTERCOURSE: Pain with intercourse: Initial Penetration and During Penetration Ability to have vaginal penetration:  Yes: with pain Climax: non painful WNL   PREGNANCY: Vaginal deliveries 1 Tearing Yes: 3rd degree tear C-section deliveries 1 Currently pregnant No  PROLAPSE: Periodically will feel vaginal bulging, heaviness in lower abdomen   OBJECTIVE:  07/21/22: standing prolapse assessment demonstrated grade 2 anterior vaginal wall laxity   06/08/22:               External Perineal Exam: WNL                              Internal Pelvic Floor: burning reported with palpation of superficial muscles; deep aching/pressure with palpation of Lt levator ani   Patient confirms identification and approves PT to assess internal pelvic floor and treatment Yes  PELVIC MMT:   MMT eval  Vaginal 3/5  Internal Anal Sphincter 1/5  External Anal Sphincter 2/5  Puborectalis 1/5  Diastasis Recti   (Blank rows = not tested)        TONE: High in Lt levator ani   PROLAPSE: Not able to tell this session due to dyssynergic pelvic floor contraction     06/01/22: COGNITION: Overall cognitive status: Within functional limits for tasks assessed     SENSATION: Light touch: Appears intact Proprioception: Appears  intact  GAIT: Comments: decreased hip extension, forward flexed trunk  POSTURE: rounded shoulders, forward head, decreased lumbar lordosis, increased thoracic kyphosis, posterior pelvic tilt, and flexed trunk    LUMBARAROM/PROM:  A/PROM A/PROM  Eval (% available)  Flexion 50  Extension 25, pain anteriorly   Right lateral flexion 50, pain anteriorly   Left lateral flexion 50, pain anteriorly    (Blank rows = not tested)   PALPATION:   General  Significant abdominal restriction and tenderness; decreased rib mobility with mobilization and breathing    TODAY'S TREATMENT:                                                                                                                              DATE:  09/01/22 Manual: Trigger Point Dry-Needling  Treatment instructions: Expect mild to moderate muscle soreness. S/S of pneumothorax if dry needled over a lung field, and to seek immediate medical attention should they occur. Patient verbalized understanding of these instructions and education.  Patient Consent Given: No Education handout provided: Yes Muscles treated: Multifidi T12-L5, bil glutes/piriformis Electrical stimulation  performed: No Parameters: N/A Treatment response/outcome: twitch response/release Soft tissue mobilization/Instrument assisted soft tissue mobilization  to lumbar paraspinals Abdominal soft tissue mobilization Bowel mobilization Therapeutic activities: Peri bottle/ cleaning with water   08/25/22 Manual: Trigger Point Dry-Needling  Treatment instructions: Expect mild to moderate muscle soreness. S/S of pneumothorax if dry needled over a lung field, and to seek immediate medical attention should they occur. Patient verbalized understanding of these instructions and education.  Patient Consent Given: No Education handout provided: Yes Muscles treated: Multifidi T12-L5, bil glutes/piriformis Electrical stimulation performed: No Parameters:  N/A Treatment response/outcome: twitch response/release Soft tissue mobilization to lumbar paraspinals Abdominal soft tissue mobilization Bowel mobilization   08/18/22 Neuromuscular re-education: Transversus abdominus training with multimodal cues for improved motor control and breath coordination pelvic tilts seated with breathing/core activation 10x Seated UE ball press with core activation 10x Seated unilateral ball press at side 10x bil Exercises: Seated open books 10x bil Seated thoracic openers 5x bil Seated horizontal abduction with core activation red band 2 x 10   PATIENT EDUCATION:  Education details: See above Person educated: Patient Education method: Programmer, multimedia, Demonstration, Tactile cues, Verbal cues, and Handouts Education comprehension: verbalized understanding  HOME EXERCISE PROGRAM: [id number]  ASSESSMENT:  CLINICAL IMPRESSION: Pt unsure if dry needling was helpful last session, but htinks she may have had more bowel movements that day. She did want to perform again today and states that she will try to observe if there is a noticeable difference. We also tried performing instrument assisted mobilization to thoracolumbar fascia. Good tolerance to all manual techniques today with improved abdominal comfort. She did have notable tenderness in Lt lower quadrant today. She will continue to benefit from skilled PT intervention in order to decrease pain, improve bowel movements, and decrease urinary urgency/incontinence.     OBJECTIVE IMPAIRMENTS: decreased activity tolerance, decreased coordination, decreased endurance, decreased mobility, decreased strength, increased fascial restrictions, increased muscle spasms, impaired tone, postural dysfunction, and pain.   ACTIVITY LIMITATIONS: lifting, bending, continence, and locomotion level  PARTICIPATION LIMITATIONS: interpersonal relationship, community activity, and occupation  PERSONAL FACTORS: 1 comorbidity: medical  history  are also affecting patient's functional outcome.   REHAB POTENTIAL: Good  CLINICAL DECISION MAKING: Stable/uncomplicated  EVALUATION COMPLEXITY: Low   GOALS: Goals reviewed with patient? Yes  SHORT TERM GOALS: Target date: 07/06/22 - updated 07/12/22 - updated 08/18/22  Pt will be independent with HEP.   Baseline: Goal status: MET 07/12/22  2.  Pt will be independent with diaphragmatic breathing and down training activities in order to improve pelvic floor relaxation.  Baseline:  Goal status: MET 07/12/22  3.  Pt will be independent with the knack, urge suppression technique, and double voiding in order to improve bladder habits and decrease urinary incontinence.   Baseline:  Goal status: IN PROGRESS  4.  Pt will be independent with use of squatty potty, relaxed toileting mechanics, and improved bowel movement techniques in order to increase ease of bowel movements and complete evacuation.   Baseline:  Goal status:IN PROGRESS  5.  Pt will be able to correctly perform diaphragmatic breathing and appropriate pressure management in order to prevent worsening vaginal wall laxity and improve pelvic floor A/ROM.   Baseline:  Goal status: IN PROGRESS  LONG TERM GOALS: Target date: 11/16/2022 - updated 07/12/22 - updated 08/18/22  Pt will be independent with advanced HEP.   Baseline:  Goal status: IN PROGRESS  2.  Pt will demonstrate normal pelvic floor muscle tone and A/ROM, able to achieve 4/5  strength with contractions and 10 sec endurance, in order to provide appropriate lumbopelvic support in functional activities.   Baseline:  Goal status: IN PROGRESS  3.  Pt will increase all impaired lumbar A/ROM by 25% without pain.  Baseline:  Goal status:  IN PROGRESS  4.  Pt will report pain no higher than 3/10 with any activity. Baseline:  Goal status:  IN PROGRESS  5.  Pt will report 0/10 pain with vaginal penetration in order to improve intimate relationship with  partner.    Baseline:  Goal status:  IN PROGRESS  6.  Pt will be able to go 2-3 hours in between voids without urgency or incontinence in order to improve QOL and perform all functional activities with less difficulty.   Baseline:  Goal status: IN PROGRESS  7.  Pt will report no leaks with laughing, coughing, sneezing in order to improve comfort with interpersonal relationships and community activities.   Baseline:  Goal status:  IN PROGRESS  8.  Pt will have consistent bowel movements 4-5x/week without straining or pain.  Baseline:  Goal status:  IN PROGRESS  PLAN:  PT FREQUENCY: 1-2x/week  PT DURATION: 6 months  PLANNED INTERVENTIONS: Therapeutic exercises, Therapeutic activity, Neuromuscular re-education, Balance training, Gait training, Patient/Family education, Self Care, Joint mobilization, Dry Needling, Biofeedback, and Manual therapy  PLAN FOR NEXT SESSION: Progress core and postural strengthening; no pelvic floor exam until we have results of biopsy.    Julio Alm, PT, DPT08/29/2412:42 PM

## 2022-09-06 ENCOUNTER — Other Ambulatory Visit (HOSPITAL_COMMUNITY): Payer: Self-pay

## 2022-09-06 DIAGNOSIS — J310 Chronic rhinitis: Secondary | ICD-10-CM | POA: Diagnosis not present

## 2022-09-06 DIAGNOSIS — J342 Deviated nasal septum: Secondary | ICD-10-CM | POA: Diagnosis not present

## 2022-09-06 DIAGNOSIS — J343 Hypertrophy of nasal turbinates: Secondary | ICD-10-CM | POA: Diagnosis not present

## 2022-09-06 MED ORDER — FLUTICASONE PROPIONATE 50 MCG/ACT NA SUSP
2.0000 | Freq: Every day | NASAL | 5 refills | Status: DC
  Filled 2022-09-06: qty 16, 30d supply, fill #0
  Filled 2022-10-12: qty 16, 30d supply, fill #1
  Filled 2022-11-23: qty 16, 30d supply, fill #2
  Filled 2023-01-02: qty 16, 30d supply, fill #3
  Filled 2023-02-03: qty 16, 30d supply, fill #4
  Filled 2023-03-03: qty 16, 30d supply, fill #5

## 2022-09-07 ENCOUNTER — Other Ambulatory Visit (HOSPITAL_BASED_OUTPATIENT_CLINIC_OR_DEPARTMENT_OTHER): Payer: Self-pay | Admitting: *Deleted

## 2022-09-07 ENCOUNTER — Other Ambulatory Visit (HOSPITAL_COMMUNITY): Payer: Self-pay

## 2022-09-07 MED ORDER — HYDROXYZINE HCL 25 MG PO TABS
25.0000 mg | ORAL_TABLET | Freq: Every evening | ORAL | 1 refills | Status: DC | PRN
  Filled 2022-09-07: qty 30, 30d supply, fill #0

## 2022-09-08 ENCOUNTER — Other Ambulatory Visit (HOSPITAL_COMMUNITY): Payer: Self-pay

## 2022-09-08 ENCOUNTER — Ambulatory Visit: Payer: Commercial Managed Care - PPO | Attending: Obstetrics & Gynecology

## 2022-09-08 DIAGNOSIS — M5459 Other low back pain: Secondary | ICD-10-CM | POA: Insufficient documentation

## 2022-09-08 DIAGNOSIS — M6281 Muscle weakness (generalized): Secondary | ICD-10-CM | POA: Insufficient documentation

## 2022-09-08 DIAGNOSIS — R102 Pelvic and perineal pain: Secondary | ICD-10-CM | POA: Insufficient documentation

## 2022-09-08 DIAGNOSIS — R279 Unspecified lack of coordination: Secondary | ICD-10-CM | POA: Insufficient documentation

## 2022-09-08 DIAGNOSIS — M62838 Other muscle spasm: Secondary | ICD-10-CM | POA: Diagnosis not present

## 2022-09-08 DIAGNOSIS — N3941 Urge incontinence: Secondary | ICD-10-CM | POA: Insufficient documentation

## 2022-09-08 DIAGNOSIS — N393 Stress incontinence (female) (male): Secondary | ICD-10-CM | POA: Insufficient documentation

## 2022-09-08 NOTE — Therapy (Signed)
OUTPATIENT PHYSICAL THERAPY FEMALE PELVIC TREATMENT   Patient Name: Karen [name] MRN: [id number] DOB:Nov 11, 1970, 52 y.o., female Today's Date: 09/08/2022  END OF SESSION:  PT End of Session - 09/08/22 1151     Visit Number 12    Date for PT Re-Evaluation 11/16/22    Authorization Type [employer]    PT Start Time 1151    PT Stop Time 1231    PT Time Calculation (min) 40 min    Activity Tolerance Patient tolerated treatment well    Behavior During Therapy [facility] for tasks assessed/performed                  Past Medical History:  Diagnosis Date   Adenomyosis    Anemia    Arthritis 2013   L knee gets steroid injections    Asthmatic bronchitis 01/31/2017   Back pain    Chronic constipation    Diarrhea    DVT of lower extremity (deep venous thrombosis) (HCC)    Dysmenorrhea    Endometriosis    Esophageal stricture    GERD (gastroesophageal reflux disease)    History of kidney stones    History of uterine fibroid    IBS (irritable bowel syndrome)    Interstitial cystitis    Lactose intolerance    Migraine    with aura   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   PONV (postoperative nausea and vomiting)    Recurrent upper respiratory infection (URI)    Sebaceous cyst of breast, right lower inner quadrant 10/25/2012   Excised 11/21/12    Sleep apnea    Syncope, non cardiac    Past Surgical History:  Procedure Laterality Date   ARTHROSCOPIC HAGLUNDS REPAIR     BREAST CYST EXCISION Right 11/21/2012   Procedure: CYST EXCISION BREAST;  Surgeon: Currie Paris, MD;  Location: [facility];  Service: General;  Laterality: Right;   BREAST CYST EXCISION Bilateral 01/18/2022   Procedure: EXCISION OF SEBACEOUS CYST BILATERAL BREAST;  Surgeon: Harriette Bouillon, MD;  Location: [facility];  Service: General;  Laterality: Bilateral;   BREAST EXCISIONAL BIOPSY Right 11/2012   CESAREAN SECTION  04/19/1993   CHOLECYSTECTOMY     COLONOSCOPY   05/02/2011   Procedure: COLONOSCOPY;  Surgeon: Vertell Novak., MD;  Location: [facility];  Service: Endoscopy;  Laterality: N/A;   colonoscopy  11/04/2014   DENTAL SURGERY  03/06/2018   2 surgeries ( 03/06/2018 and 04/06/2018)   to remove two separate benign tumors   ESOPHAGEAL MANOMETRY N/A 11/04/2015   Procedure: ESOPHAGEAL MANOMETRY (EM);  Surgeon: Carman Ching, MD;  Location: [facility];  Service: Endoscopy;  Laterality: N/A;   ESOPHAGOGASTRODUODENOSCOPY  05/02/2011   Procedure: ESOPHAGOGASTRODUODENOSCOPY (EGD);  Surgeon: Vertell Novak., MD;  Location: [facility];  Service: Endoscopy;  Laterality: N/A;   ESOPHAGOGASTRODUODENOSCOPY N/A 09/01/2014   Procedure: ESOPHAGOGASTRODUODENOSCOPY (EGD);  Surgeon: Carman Ching, MD;  Location: [facility];  Service: Endoscopy;  Laterality: N/A;   KNEE SURGERY     LAPAROSCOPY     x 4   LESION REMOVAL N/A 01/18/2022   Procedure: EXCISION OF SEBACEOUS CYSTS CHEST AND NECK;  Surgeon: Harriette Bouillon, MD;  Location: [facility];  Service: General;  Laterality: N/A;   PH IMPEDANCE STUDY N/A 11/04/2015   Procedure: PH IMPEDANCE STUDY;  Surgeon: Carman Ching, MD;  Location: [facility];  Service: Endoscopy;  Laterality: N/A;   VAGINAL HYSTERECTOMY  2010   TVH--ovaries remain   Patient Active  Problem List   Diagnosis Date Noted   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   Elevated LFTs 04/04/2021   Colitis 04/04/2021   Bacterial overgrowth syndrome 05/21/2020   Obesity 05/21/2020   History of endometriosis 05/21/2020   Constipation due to outlet dysfunction 02/05/2020   Gastroesophageal reflux disease 02/05/2020   Obstructive sleep apnea treated with continuous positive airway pressure (CPAP) 06/16/2017   Perennial allergic rhinitis with a nonallergic component 01/31/2017   Asthmatic bronchitis 01/31/2017   GI symptoms 01/31/2017   Family history of colon cancer 01/27/2015   Chest pain 12/13/2012   Dyspnea 12/13/2012   DVT of  lower extremity (deep venous thrombosis) (HCC) 01/03/1990    PCP: Lorenda Ishihara, MD  REFERRING PROVIDER: Jerene Bears, MD   REFERRING DIAG: M62.89 (ICD-10-CM) - Pelvic floor dysfunction  THERAPY DIAG:  Pelvic pain  Other low back pain  Urgency incontinence  Stress incontinence (female) (female)  Muscle weakness (generalized)  Other muscle spasm  Unspecified lack of coordination  Rationale for Evaluation and Treatment: Rehabilitation  ONSET DATE: chronic  SUBJECTIVE:                                                                                                                                                                                           SUBJECTIVE STATEMENT: Pt states that she is starting to feel more abdominal heaviness again and is wondering if there is a stool burden building back up. She has noticed a decrease in bowel movements - she is only having 2-4 a day instead of 6-8. She feels like she has strained her back, worse on Rt side compared to Lt. She is working on [employer].   PAIN:  Are you having pain? Yes NPRS scale: 4/10 Pain location:  Lower abdominal pain, Lt sided pain, low back pain  Pain type: turning, knotted, pressure Pain description: intermittent and constant   Aggravating factors: constipation or frequent bowel movements/constantly having bowel movements  Relieving factors: exercises (bowel massage, happy baby, spinal twists, walking)  PRECAUTIONS: None  WEIGHT BEARING RESTRICTIONS: No  FALLS:  Has patient fallen in last 6 months? No  LIVING ENVIRONMENT: Lives with: lives with their spouse Lives in: House/apartment  OCCUPATION: nurse, in admin  PLOF: Independent  PATIENT GOALS: decrease pain and have more normal bowel movements  PERTINENT HISTORY:  Vaginal hysterectomy, DVT LE, endometriosis, interstitial cystitis, exploratory laps for endometriosis, c-section, cholecystectomy Sexual abuse: No  BOWEL MOVEMENT: Pain  with bowel movement: Yes Type of bowel movement:Type (Bristol Stool Scale) 1-7 (sometimes full range in the same bowel movement), Frequency sometimes many times a day, sometimes up to 4 days in between, and  Strain Yes Fully empty rectum: No Leakage: No Pads: No Fiber supplement: No - has attempted metamucil and it made constipation worse  URINATION: Pain with urination: Yes Fully empty bladder: No Stream:  varies  Urgency: Yes: all the time Frequency: multiple times an hours  Leakage: Urge to void, Walking to the bathroom, Coughing, Sneezing, Laughing, and Exercise Pads: Yes: daily  INTERCOURSE: Pain with intercourse: Initial Penetration and During Penetration Ability to have vaginal penetration:  Yes: with pain Climax: non painful WNL   PREGNANCY: Vaginal deliveries 1 Tearing Yes: 3rd degree tear C-section deliveries 1 Currently pregnant No  PROLAPSE: Periodically will feel vaginal bulging, heaviness in lower abdomen   OBJECTIVE:  07/21/22: standing prolapse assessment demonstrated grade 2 anterior vaginal wall laxity   06/08/22:               External Perineal Exam: WNL                              Internal Pelvic Floor: burning reported with palpation of superficial muscles; deep aching/pressure with palpation of Lt levator ani   Patient confirms identification and approves PT to assess internal pelvic floor and treatment Yes  PELVIC MMT:   MMT eval  Vaginal 3/5  Internal Anal Sphincter 1/5  External Anal Sphincter 2/5  Puborectalis 1/5  Diastasis Recti   (Blank rows = not tested)        TONE: High in Lt levator ani   PROLAPSE: Not able to tell this session due to dyssynergic pelvic floor contraction     06/01/22: COGNITION: Overall cognitive status: Within functional limits for tasks assessed     SENSATION: Light touch: Appears intact Proprioception: Appears intact  GAIT: Comments: decreased hip extension, forward flexed trunk  POSTURE: rounded  shoulders, forward head, decreased lumbar lordosis, increased thoracic kyphosis, posterior pelvic tilt, and flexed trunk    LUMBARAROM/PROM:  A/PROM A/PROM  Eval (% available)  Flexion 50  Extension 25, pain anteriorly   Right lateral flexion 50, pain anteriorly   Left lateral flexion 50, pain anteriorly    (Blank rows = not tested)   PALPATION:   General  Significant abdominal restriction and tenderness; decreased rib mobility with mobilization and breathing    TODAY'S TREATMENT:                                                                                                                              DATE:  09/08/22 Manual: Trigger Point Dry-Needling  Treatment instructions: Expect mild to moderate muscle soreness. S/S of pneumothorax if dry needled over a lung field, and to seek immediate medical attention should they occur. Patient verbalized understanding of these instructions and education.  Patient Consent Given: No Education handout provided: Yes Muscles treated: Multifidi T12-L5, bil glutes/piriformis Electrical stimulation performed: No Parameters: N/A Treatment response/outcome: twitch response/release Soft tissue mobilization/Instrument assisted soft tissue mobilization to lumbar paraspinals  Exercises: Open books 10x bil Bridge with hip adduction 10x Seated lateral flexion 10 breaths bil Standing adductor stretch 60 sec bil Seated forward fold 10 breaths  09/01/22 Manual: Trigger Point Dry-Needling  Treatment instructions: Expect mild to moderate muscle soreness. S/S of pneumothorax if dry needled over a lung field, and to seek immediate medical attention should they occur. Patient verbalized understanding of these instructions and education.  Patient Consent Given: No Education handout provided: Yes Muscles treated: Multifidi T12-L5, bil glutes/piriformis Electrical stimulation performed: No Parameters: N/A Treatment response/outcome: twitch  response/release Soft tissue mobilization/Instrument assisted soft tissue mobilization  to lumbar paraspinals Abdominal soft tissue mobilization Bowel mobilization Therapeutic activities: Peri bottle/ cleaning with water   08/25/22 Manual: Trigger Point Dry-Needling  Treatment instructions: Expect mild to moderate muscle soreness. S/S of pneumothorax if dry needled over a lung field, and to seek immediate medical attention should they occur. Patient verbalized understanding of these instructions and education.  Patient Consent Given: No Education handout provided: Yes Muscles treated: Multifidi T12-L5, bil glutes/piriformis Electrical stimulation performed: No Parameters: N/A Treatment response/outcome: twitch response/release Soft tissue mobilization to lumbar paraspinals Abdominal soft tissue mobilization Bowel mobilization   PATIENT EDUCATION:  Education details: See above Person educated: Patient Education method: Explanation, Demonstration, Tactile cues, Verbal cues, and Handouts Education comprehension: verbalized understanding  HOME EXERCISE PROGRAM: [id number]  ASSESSMENT:  CLINICAL IMPRESSION: Pt not doing well in general. She feels like she is getting another stool burden and has placed call to MD to get x-ray. We addressed acute spasm/pain in Lt glutes/low back today with dry needling and manual techniques. Bil lumbar paraspinals are very tense, likely contributing to constipation and increased stool burden. She tolerated all manual techniques well. She did well with mobility and gentle strengthening activities with no increase in pain. Discussed possible contribution of endometriosis to slow bowel motility and that she should inquire about this at GI appointment and then possibly with specialist in urogyn if she is not feeling better. She will continue to benefit from skilled PT intervention in order to decrease pain, improve bowel movements, and decrease urinary  urgency/incontinence.     OBJECTIVE IMPAIRMENTS: decreased activity tolerance, decreased coordination, decreased endurance, decreased mobility, decreased strength, increased fascial restrictions, increased muscle spasms, impaired tone, postural dysfunction, and pain.   ACTIVITY LIMITATIONS: lifting, bending, continence, and locomotion level  PARTICIPATION LIMITATIONS: interpersonal relationship, community activity, and occupation  PERSONAL FACTORS: 1 comorbidity: medical history  are also affecting patient's functional outcome.   REHAB POTENTIAL: Good  CLINICAL DECISION MAKING: Stable/uncomplicated  EVALUATION COMPLEXITY: Low   GOALS: Goals reviewed with patient? Yes  SHORT TERM GOALS: Target date: 07/06/22 - updated 07/12/22 - updated 08/18/22  Pt will be independent with HEP.   Baseline: Goal status: MET 07/12/22  2.  Pt will be independent with diaphragmatic breathing and down training activities in order to improve pelvic floor relaxation.  Baseline:  Goal status: MET 07/12/22  3.  Pt will be independent with the knack, urge suppression technique, and double voiding in order to improve bladder habits and decrease urinary incontinence.   Baseline:  Goal status: IN PROGRESS  4.  Pt will be independent with use of squatty potty, relaxed toileting mechanics, and improved bowel movement techniques in order to increase ease of bowel movements and complete evacuation.   Baseline:  Goal status:IN PROGRESS  5.  Pt will be able to correctly perform diaphragmatic breathing and appropriate pressure management in order to prevent worsening vaginal wall laxity and improve pelvic  floor A/ROM.   Baseline:  Goal status: IN PROGRESS  LONG TERM GOALS: Target date: 11/16/2022 - updated 07/12/22 - updated 08/18/22  Pt will be independent with advanced HEP.   Baseline:  Goal status: IN PROGRESS  2.  Pt will demonstrate normal pelvic floor muscle tone and A/ROM, able to achieve 4/5 strength  with contractions and 10 sec endurance, in order to provide appropriate lumbopelvic support in functional activities.   Baseline:  Goal status: IN PROGRESS  3.  Pt will increase all impaired lumbar A/ROM by 25% without pain.  Baseline:  Goal status:  IN PROGRESS  4.  Pt will report pain no higher than 3/10 with any activity. Baseline:  Goal status:  IN PROGRESS  5.  Pt will report 0/10 pain with vaginal penetration in order to improve intimate relationship with partner.    Baseline:  Goal status:  IN PROGRESS  6.  Pt will be able to go 2-3 hours in between voids without urgency or incontinence in order to improve QOL and perform all functional activities with less difficulty.   Baseline:  Goal status: IN PROGRESS  7.  Pt will report no leaks with laughing, coughing, sneezing in order to improve comfort with interpersonal relationships and community activities.   Baseline:  Goal status:  IN PROGRESS  8.  Pt will have consistent bowel movements 4-5x/week without straining or pain.  Baseline:  Goal status:  IN PROGRESS  PLAN:  PT FREQUENCY: 1-2x/week  PT DURATION: 6 months  PLANNED INTERVENTIONS: Therapeutic exercises, Therapeutic activity, Neuromuscular re-education, Balance training, Gait training, Patient/Family education, Self Care, Joint mobilization, Dry Needling, Biofeedback, and Manual therapy  PLAN FOR NEXT SESSION: Progress core and postural strengthening, manual techniques as needed - 3 more visits.   Julio Alm, PT, DPT09/05/2410:29 PM

## 2022-09-15 ENCOUNTER — Ambulatory Visit: Payer: Commercial Managed Care - PPO

## 2022-09-15 DIAGNOSIS — M62838 Other muscle spasm: Secondary | ICD-10-CM | POA: Diagnosis not present

## 2022-09-15 DIAGNOSIS — N3941 Urge incontinence: Secondary | ICD-10-CM

## 2022-09-15 DIAGNOSIS — R102 Pelvic and perineal pain: Secondary | ICD-10-CM

## 2022-09-15 DIAGNOSIS — N393 Stress incontinence (female) (male): Secondary | ICD-10-CM

## 2022-09-15 DIAGNOSIS — R279 Unspecified lack of coordination: Secondary | ICD-10-CM | POA: Diagnosis not present

## 2022-09-15 DIAGNOSIS — M6281 Muscle weakness (generalized): Secondary | ICD-10-CM | POA: Diagnosis not present

## 2022-09-15 DIAGNOSIS — M5459 Other low back pain: Secondary | ICD-10-CM | POA: Diagnosis not present

## 2022-09-15 NOTE — Therapy (Signed)
OUTPATIENT PHYSICAL THERAPY FEMALE PELVIC TREATMENT   Patient Name: Karen [name] MRN: [id number] DOB:May 30, 1970, 52 y.o., female Today's Date: 09/15/2022  END OF SESSION:  PT End of Session - 09/15/22 1151     Visit Number 13    Date for PT Re-Evaluation 11/16/22    Authorization Type [employer]    PT Start Time 1149    PT Stop Time 1229    PT Time Calculation (min) 40 min    Activity Tolerance Patient tolerated treatment well    Behavior During Therapy [facility] for tasks assessed/performed                  Past Medical History:  Diagnosis Date   Adenomyosis    Anemia    Arthritis 2013   L knee gets steroid injections    Asthmatic bronchitis 01/31/2017   Back pain    Chronic constipation    Diarrhea    DVT of lower extremity (deep venous thrombosis) (HCC)    Dysmenorrhea    Endometriosis    Esophageal stricture    GERD (gastroesophageal reflux disease)    History of kidney stones    History of uterine fibroid    IBS (irritable bowel syndrome)    Interstitial cystitis    Lactose intolerance    Migraine    with aura   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   PONV (postoperative nausea and vomiting)    Recurrent upper respiratory infection (URI)    Sebaceous cyst of breast, right lower inner quadrant 10/25/2012   Excised 11/21/12    Sleep apnea    Syncope, non cardiac    Past Surgical History:  Procedure Laterality Date   ARTHROSCOPIC HAGLUNDS REPAIR     BREAST CYST EXCISION Right 11/21/2012   Procedure: CYST EXCISION BREAST;  Surgeon: Currie Paris, MD;  Location: [facility];  Service: General;  Laterality: Right;   BREAST CYST EXCISION Bilateral 01/18/2022   Procedure: EXCISION OF SEBACEOUS CYST BILATERAL BREAST;  Surgeon: Harriette Bouillon, MD;  Location: [facility];  Service: General;  Laterality: Bilateral;   BREAST EXCISIONAL BIOPSY Right 11/2012   CESAREAN SECTION  04/19/1993   CHOLECYSTECTOMY     COLONOSCOPY   05/02/2011   Procedure: COLONOSCOPY;  Surgeon: Vertell Novak., MD;  Location: [facility];  Service: Endoscopy;  Laterality: N/A;   colonoscopy  11/04/2014   DENTAL SURGERY  03/06/2018   2 surgeries ( 03/06/2018 and 04/06/2018)   to remove two separate benign tumors   ESOPHAGEAL MANOMETRY N/A 11/04/2015   Procedure: ESOPHAGEAL MANOMETRY (EM);  Surgeon: Carman Ching, MD;  Location: [facility];  Service: Endoscopy;  Laterality: N/A;   ESOPHAGOGASTRODUODENOSCOPY  05/02/2011   Procedure: ESOPHAGOGASTRODUODENOSCOPY (EGD);  Surgeon: Vertell Novak., MD;  Location: [facility];  Service: Endoscopy;  Laterality: N/A;   ESOPHAGOGASTRODUODENOSCOPY N/A 09/01/2014   Procedure: ESOPHAGOGASTRODUODENOSCOPY (EGD);  Surgeon: Carman Ching, MD;  Location: [facility];  Service: Endoscopy;  Laterality: N/A;   KNEE SURGERY     LAPAROSCOPY     x 4   LESION REMOVAL N/A 01/18/2022   Procedure: EXCISION OF SEBACEOUS CYSTS CHEST AND NECK;  Surgeon: Harriette Bouillon, MD;  Location:  S[facility]ERY [facility];  Service: General;  Laterality: N/A;   PH IMPEDANCE STUDY N/A 11/04/2015   Procedure: PH IMPEDANCE STUDY;  Surgeon: Carman Ching, MD;  Location: [facility];  Service: Endoscopy;  Laterality: N/A;   VAGINAL HYSTERECTOMY  2010   TVH--ovaries remain   Patient Active  Problem List   Diagnosis Date Noted   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   Elevated LFTs 04/04/2021   Colitis 04/04/2021   Bacterial overgrowth syndrome 05/21/2020   Obesity 05/21/2020   History of endometriosis 05/21/2020   Constipation due to outlet dysfunction 02/05/2020   Gastroesophageal reflux disease 02/05/2020   Obstructive sleep apnea treated with continuous positive airway pressure (CPAP) 06/16/2017   Perennial allergic rhinitis with a nonallergic component 01/31/2017   Asthmatic bronchitis 01/31/2017   GI symptoms 01/31/2017   Family history of colon cancer 01/27/2015   Chest pain 12/13/2012   Dyspnea 12/13/2012   DVT of  lower extremity (deep venous thrombosis) (HCC) 01/03/1990    PCP: Lorenda Ishihara, MD  REFERRING PROVIDER: Jerene Bears, MD   REFERRING DIAG: M62.89 (ICD-10-CM) - Pelvic floor dysfunction  THERAPY DIAG:  Pelvic pain  Other low back pain  Urgency incontinence  Stress incontinence (female) (female)  Muscle weakness (generalized)  Other muscle spasm  Unspecified lack of coordination  Rationale for Evaluation and Treatment: Rehabilitation  ONSET DATE: chronic  SUBJECTIVE:                                                                                                                                                                                           SUBJECTIVE STATEMENT: Pt states that she did do a cleanse per MD instruction. She had 4 days of diarrhea. She did not have x-ray to confirm stool burden, but due to symptoms she was instructed to go ahead and do. She just started soft diet back yesterday. She does still feel the heaviness and pressure in low back/abdomen, but it is not as bad. She has started lidocaine/diazepam cream for anal area. She feels like manual techniques to low back and abdomen are still helpful.   PAIN:  Are you having pain? Yes NPRS scale: 4/10 Pain location:  Lower abdominal pain, Lt sided pain, low back pain  Pain type: turning, knotted, pressure Pain description: intermittent and constant   Aggravating factors: constipation or frequent bowel movements/constantly having bowel movements  Relieving factors: exercises (bowel massage, happy baby, spinal twists, walking)  PRECAUTIONS: None  WEIGHT BEARING RESTRICTIONS: No  FALLS:  Has patient fallen in last 6 months? No  LIVING ENVIRONMENT: Lives with: lives with their spouse Lives in: House/apartment  OCCUPATION: nurse, in admin  PLOF: Independent  PATIENT GOALS: decrease pain and have more normal bowel movements  PERTINENT HISTORY:  Vaginal hysterectomy, DVT LE,  endometriosis, interstitial cystitis, exploratory laps for endometriosis, c-section, cholecystectomy Sexual abuse: No  BOWEL MOVEMENT: Pain with bowel movement: Yes Type of bowel movement:Type (Bristol Stool Scale) 1-7 (sometimes full  range in the same bowel movement), Frequency sometimes many times a day, sometimes up to 4 days in between, and Strain Yes Fully empty rectum: No Leakage: No Pads: No Fiber supplement: No - has attempted metamucil and it made constipation worse  URINATION: Pain with urination: Yes Fully empty bladder: No Stream:  varies  Urgency: Yes: all the time Frequency: multiple times an hours  Leakage: Urge to void, Walking to the bathroom, Coughing, Sneezing, Laughing, and Exercise Pads: Yes: daily  INTERCOURSE: Pain with intercourse: Initial Penetration and During Penetration Ability to have vaginal penetration:  Yes: with pain Climax: non painful WNL   PREGNANCY: Vaginal deliveries 1 Tearing Yes: 3rd degree tear C-section deliveries 1 Currently pregnant No  PROLAPSE: Periodically will feel vaginal bulging, heaviness in lower abdomen   OBJECTIVE:  07/21/22: standing prolapse assessment demonstrated grade 2 anterior vaginal wall laxity   06/08/22:               External Perineal Exam: WNL                              Internal Pelvic Floor: burning reported with palpation of superficial muscles; deep aching/pressure with palpation of Lt levator ani   Patient confirms identification and approves PT to assess internal pelvic floor and treatment Yes  PELVIC MMT:   MMT eval  Vaginal 3/5  Internal Anal Sphincter 1/5  External Anal Sphincter 2/5  Puborectalis 1/5  Diastasis Recti   (Blank rows = not tested)        TONE: High in Lt levator ani   PROLAPSE: Not able to tell this session due to dyssynergic pelvic floor contraction     06/01/22: COGNITION: Overall cognitive status: Within functional limits for tasks  assessed     SENSATION: Light touch: Appears intact Proprioception: Appears intact  GAIT: Comments: decreased hip extension, forward flexed trunk  POSTURE: rounded shoulders, forward head, decreased lumbar lordosis, increased thoracic kyphosis, posterior pelvic tilt, and flexed trunk    LUMBARAROM/PROM:  A/PROM A/PROM  Eval (% available)  Flexion 50  Extension 25, pain anteriorly   Right lateral flexion 50, pain anteriorly   Left lateral flexion 50, pain anteriorly    (Blank rows = not tested)   PALPATION:   General  Significant abdominal restriction and tenderness; decreased rib mobility with mobilization and breathing    TODAY'S TREATMENT:                                                                                                                              DATE:  09/15/22 Manual: Trigger Point Dry-Needling  Treatment instructions: Expect mild to moderate muscle soreness. S/S of pneumothorax if dry needled over a lung field, and to seek immediate medical attention should they occur. Patient verbalized understanding of these instructions and education.  Patient Consent Given: No Education handout provided: Yes Muscles treated: Multifidi T12-L5, bil glutes/piriformis Electrical  stimulation performed: No Parameters: N/A Treatment response/outcome: twitch response/release Soft tissue mobilization/Instrument assisted soft tissue mobilization to lumbar paraspinals  Abdominal soft tissue mobilization Therapeutic activities: Diet Increasing fiber slowly Less processed food having more beneficial fiber Possible benefit of building bulk in bowel movements to encourage normal peristalsis and decrease frequency of bowel movements - having more complete movements    09/08/22 Manual: Trigger Point Dry-Needling  Treatment instructions: Expect mild to moderate muscle soreness. S/S of pneumothorax if dry needled over a lung field, and to seek immediate medical attention should  they occur. Patient verbalized understanding of these instructions and education.  Patient Consent Given: No Education handout provided: Yes Muscles treated: Multifidi T12-L5, bil glutes/piriformis Electrical stimulation performed: No Parameters: N/A Treatment response/outcome: twitch response/release Soft tissue mobilization/Instrument assisted soft tissue mobilization to lumbar paraspinals  Exercises: Open books 10x bil Bridge with hip adduction 10x Seated lateral flexion 10 breaths bil Standing adductor stretch 60 sec bil Seated forward fold 10 breaths  09/01/22 Manual: Trigger Point Dry-Needling  Treatment instructions: Expect mild to moderate muscle soreness. S/S of pneumothorax if dry needled over a lung field, and to seek immediate medical attention should they occur. Patient verbalized understanding of these instructions and education.  Patient Consent Given: No Education handout provided: Yes Muscles treated: Multifidi T12-L5, bil glutes/piriformis Electrical stimulation performed: No Parameters: N/A Treatment response/outcome: twitch response/release Soft tissue mobilization/Instrument assisted soft tissue mobilization  to lumbar paraspinals Abdominal soft tissue mobilization Bowel mobilization Therapeutic activities: Peri bottle/ cleaning with water   PATIENT EDUCATION:  Education details: See above Person educated: Patient Education method: Programmer, multimedia, Demonstration, Tactile cues, Verbal cues, and Handouts Education comprehension: verbalized understanding  HOME EXERCISE PROGRAM: [id number]  ASSESSMENT:  CLINICAL IMPRESSION: Pt is feeling a little better due to having done cleanse over the course of this last week. She is working on making significant dietary changes. We discussed benefit of fiber, but increasing it very slowly as to not increase constipation. She may benefit from increased bulk in stool to encourage better peristalsis, more complete bowel  movements, with less frequency; however, it is hard to begin this since she is already having frequent stool burdens. She continues to tolerate manual techniques well with decrease in tissue tension and decreased discomfort. She will continue to benefit from skilled PT intervention in order to decrease pain, improve bowel movements, and decrease urinary urgency/incontinence.     OBJECTIVE IMPAIRMENTS: decreased activity tolerance, decreased coordination, decreased endurance, decreased mobility, decreased strength, increased fascial restrictions, increased muscle spasms, impaired tone, postural dysfunction, and pain.   ACTIVITY LIMITATIONS: lifting, bending, continence, and locomotion level  PARTICIPATION LIMITATIONS: interpersonal relationship, community activity, and occupation  PERSONAL FACTORS: 1 comorbidity: medical history  are also affecting patient's functional outcome.   REHAB POTENTIAL: Good  CLINICAL DECISION MAKING: Stable/uncomplicated  EVALUATION COMPLEXITY: Low   GOALS: Goals reviewed with patient? Yes  SHORT TERM GOALS: Target date: 07/06/22 - updated 07/12/22 - updated 08/18/22  Pt will be independent with HEP.   Baseline: Goal status: MET 07/12/22  2.  Pt will be independent with diaphragmatic breathing and down training activities in order to improve pelvic floor relaxation.  Baseline:  Goal status: MET 07/12/22  3.  Pt will be independent with the knack, urge suppression technique, and double voiding in order to improve bladder habits and decrease urinary incontinence.   Baseline:  Goal status: IN PROGRESS  4.  Pt will be independent with use of squatty potty, relaxed toileting mechanics, and improved bowel movement techniques  in order to increase ease of bowel movements and complete evacuation.   Baseline:  Goal status:IN PROGRESS  5.  Pt will be able to correctly perform diaphragmatic breathing and appropriate pressure management in order to prevent worsening  vaginal wall laxity and improve pelvic floor A/ROM.   Baseline:  Goal status: IN PROGRESS  LONG TERM GOALS: Target date: 11/16/2022 - updated 07/12/22 - updated 08/18/22  Pt will be independent with advanced HEP.   Baseline:  Goal status: IN PROGRESS  2.  Pt will demonstrate normal pelvic floor muscle tone and A/ROM, able to achieve 4/5 strength with contractions and 10 sec endurance, in order to provide appropriate lumbopelvic support in functional activities.   Baseline:  Goal status: IN PROGRESS  3.  Pt will increase all impaired lumbar A/ROM by 25% without pain.  Baseline:  Goal status:  IN PROGRESS  4.  Pt will report pain no higher than 3/10 with any activity. Baseline:  Goal status:  IN PROGRESS  5.  Pt will report 0/10 pain with vaginal penetration in order to improve intimate relationship with partner.    Baseline:  Goal status:  IN PROGRESS  6.  Pt will be able to go 2-3 hours in between voids without urgency or incontinence in order to improve QOL and perform all functional activities with less difficulty.   Baseline:  Goal status: IN PROGRESS  7.  Pt will report no leaks with laughing, coughing, sneezing in order to improve comfort with interpersonal relationships and community activities.   Baseline:  Goal status:  IN PROGRESS  8.  Pt will have consistent bowel movements 4-5x/week without straining or pain.  Baseline:  Goal status:  IN PROGRESS  PLAN:  PT FREQUENCY: 1-2x/week  PT DURATION: 6 months  PLANNED INTERVENTIONS: Therapeutic exercises, Therapeutic activity, Neuromuscular re-education, Balance training, Gait training, Patient/Family education, Self Care, Joint mobilization, Dry Needling, Biofeedback, and Manual therapy  PLAN FOR NEXT SESSION: Progress core and postural strengthening, manual techniques as needed - 2 more visits.   Julio Alm, PT, DPT09/12/2410:18 PM

## 2022-09-22 ENCOUNTER — Ambulatory Visit: Payer: Commercial Managed Care - PPO

## 2022-09-22 DIAGNOSIS — M5459 Other low back pain: Secondary | ICD-10-CM | POA: Diagnosis not present

## 2022-09-22 DIAGNOSIS — R102 Pelvic and perineal pain: Secondary | ICD-10-CM | POA: Diagnosis not present

## 2022-09-22 DIAGNOSIS — N393 Stress incontinence (female) (male): Secondary | ICD-10-CM | POA: Diagnosis not present

## 2022-09-22 DIAGNOSIS — N3941 Urge incontinence: Secondary | ICD-10-CM | POA: Diagnosis not present

## 2022-09-22 DIAGNOSIS — M62838 Other muscle spasm: Secondary | ICD-10-CM

## 2022-09-22 DIAGNOSIS — R279 Unspecified lack of coordination: Secondary | ICD-10-CM

## 2022-09-22 DIAGNOSIS — M6281 Muscle weakness (generalized): Secondary | ICD-10-CM | POA: Diagnosis not present

## 2022-09-22 NOTE — Therapy (Signed)
OUTPATIENT PHYSICAL THERAPY FEMALE PELVIC TREATMENT   Patient Name: RYLYNN MINNIEAR MRN: [id number] DOB:04-Sep-1970, 52 y.o., female Today's Date: 09/22/2022  END OF SESSION:  PT End of Session - 09/22/22 1157     Visit Number 14    Date for PT Re-Evaluation 11/16/22    Authorization Type [employer]    PT Start Time 1154    PT Stop Time 1227    PT Time Calculation (min) 33 min    Activity Tolerance Patient tolerated treatment well    Behavior During Therapy [facility] for tasks assessed/performed                  Past Medical History:  Diagnosis Date   Adenomyosis    Anemia    Arthritis 2013   L knee gets steroid injections    Asthmatic bronchitis 01/31/2017   Back pain    Chronic constipation    Diarrhea    DVT of lower extremity (deep venous thrombosis) (HCC)    Dysmenorrhea    Endometriosis    Esophageal stricture    GERD (gastroesophageal reflux disease)    History of kidney stones    History of uterine fibroid    IBS (irritable bowel syndrome)    Interstitial cystitis    Lactose intolerance    Migraine    with aura   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   PONV (postoperative nausea and vomiting)    Recurrent upper respiratory infection (URI)    Sebaceous cyst of breast, right lower inner quadrant 10/25/2012   Excised 11/21/12    Sleep apnea    Syncope, non cardiac    Past Surgical History:  Procedure Laterality Date   ARTHROSCOPIC HAGLUNDS REPAIR     BREAST CYST EXCISION Right 11/21/2012   Procedure: CYST EXCISION BREAST;  Surgeon: Currie Paris, MD;  Location: [facility];  Service: General;  Laterality: Right;   BREAST CYST EXCISION Bilateral 01/18/2022   Procedure: EXCISION OF SEBACEOUS CYST BILATERAL BREAST;  Surgeon: Harriette Bouillon, MD;  Location: [facility];  Service: General;  Laterality: Bilateral;   BREAST EXCISIONAL BIOPSY Right 11/2012   CESAREAN SECTION  04/19/1993   CHOLECYSTECTOMY     COLONOSCOPY   05/02/2011   Procedure: COLONOSCOPY;  Surgeon: Vertell Novak., MD;  Location: [facility];  Service: Endoscopy;  Laterality: N/A;   colonoscopy  11/04/2014   DENTAL SURGERY  03/06/2018   2 surgeries ( 03/06/2018 and 04/06/2018)   to remove two separate benign tumors   ESOPHAGEAL MANOMETRY N/A 11/04/2015   Procedure: ESOPHAGEAL MANOMETRY (EM);  Surgeon: Carman Ching, MD;  Location: [facility];  Service: Endoscopy;  Laterality: N/A;   ESOPHAGOGASTRODUODENOSCOPY  05/02/2011   Procedure: ESOPHAGOGASTRODUODENOSCOPY (EGD);  Surgeon: Vertell Novak., MD;  Location: [facility];  Service: Endoscopy;  Laterality: N/A;   ESOPHAGOGASTRODUODENOSCOPY N/A 09/01/2014   Procedure: ESOPHAGOGASTRODUODENOSCOPY (EGD);  Surgeon: Carman Ching, MD;  Location: [facility];  Service: Endoscopy;  Laterality: N/A;   KNEE SURGERY     LAPAROSCOPY     x 4   LESION REMOVAL N/A 01/18/2022   Procedure: EXCISION OF SEBACEOUS CYSTS CHEST AND NECK;  Surgeon: Harriette Bouillon, MD;  Location: [facility];  Service: General;  Laterality: N/A;   PH IMPEDANCE STUDY N/A 11/04/2015   Procedure: PH IMPEDANCE STUDY;  Surgeon: Carman Ching, MD;  Location: [facility];  Service: Endoscopy;  Laterality: N/A;   VAGINAL HYSTERECTOMY  2010   TVH--ovaries remain   Patient Active  Problem List   Diagnosis Date Noted   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   Elevated LFTs 04/04/2021   Colitis 04/04/2021   Bacterial overgrowth syndrome 05/21/2020   Obesity 05/21/2020   History of endometriosis 05/21/2020   Constipation due to outlet dysfunction 02/05/2020   Gastroesophageal reflux disease 02/05/2020   Obstructive sleep apnea treated with continuous positive airway pressure (CPAP) 06/16/2017   Perennial allergic rhinitis with a nonallergic component 01/31/2017   Asthmatic bronchitis 01/31/2017   GI symptoms 01/31/2017   Family history of colon cancer 01/27/2015   Chest pain 12/13/2012   Dyspnea 12/13/2012   DVT of  lower extremity (deep venous thrombosis) (HCC) 01/03/1990    PCP: Lorenda Ishihara, MD  REFERRING PROVIDER: Jerene Bears, MD   REFERRING DIAG: M62.89 (ICD-10-CM) - Pelvic floor dysfunction  THERAPY DIAG:  Pelvic pain  Other low back pain  Urgency incontinence  Stress incontinence (female) (female)  Muscle weakness (generalized)  Other muscle spasm  Unspecified lack of coordination  Rationale for Evaluation and Treatment: Rehabilitation  ONSET DATE: chronic  SUBJECTIVE:                                                                                                                                                                                           SUBJECTIVE STATEMENT: Pt states that vaginal irritation is getting. She has appointment next week. She is still having very frequent bowel movements and is planning on cutting back on the miralax.   PAIN:  Are you having pain? Yes NPRS scale: 4/10 Pain location:  Lower abdominal pain, Lt sided pain, low back pain  Pain type: turning, knotted, pressure Pain description: intermittent and constant   Aggravating factors: constipation or frequent bowel movements/constantly having bowel movements  Relieving factors: exercises (bowel massage, happy baby, spinal twists, walking)  PRECAUTIONS: None  WEIGHT BEARING RESTRICTIONS: No  FALLS:  Has patient fallen in last 6 months? No  LIVING ENVIRONMENT: Lives with: lives with their spouse Lives in: House/apartment  OCCUPATION: nurse, in admin  PLOF: Independent  PATIENT GOALS: decrease pain and have more normal bowel movements  PERTINENT HISTORY:  Vaginal hysterectomy, DVT LE, endometriosis, interstitial cystitis, exploratory laps for endometriosis, c-section, cholecystectomy Sexual abuse: No  BOWEL MOVEMENT: Pain with bowel movement: Yes Type of bowel movement:Type (Bristol Stool Scale) 1-7 (sometimes full range in the same bowel movement), Frequency sometimes  many times a day, sometimes up to 4 days in between, and Strain Yes Fully empty rectum: No Leakage: No Pads: No Fiber supplement: No - has attempted metamucil and it made constipation worse  URINATION: Pain with urination: Yes Fully empty bladder: No Stream:  varies  Urgency: Yes: all the time Frequency: multiple times an hours  Leakage: Urge to void, Walking to the bathroom, Coughing, Sneezing, Laughing, and Exercise Pads: Yes: daily  INTERCOURSE: Pain with intercourse: Initial Penetration and During Penetration Ability to have vaginal penetration:  Yes: with pain Climax: non painful WNL   PREGNANCY: Vaginal deliveries 1 Tearing Yes: 3rd degree tear C-section deliveries 1 Currently pregnant No  PROLAPSE: Periodically will feel vaginal bulging, heaviness in lower abdomen   OBJECTIVE:  07/21/22: standing prolapse assessment demonstrated grade 2 anterior vaginal wall laxity   06/08/22:               External Perineal Exam: WNL                              Internal Pelvic Floor: burning reported with palpation of superficial muscles; deep aching/pressure with palpation of Lt levator ani   Patient confirms identification and approves PT to assess internal pelvic floor and treatment Yes  PELVIC MMT:   MMT eval  Vaginal 3/5  Internal Anal Sphincter 1/5  External Anal Sphincter 2/5  Puborectalis 1/5  Diastasis Recti   (Blank rows = not tested)        TONE: High in Lt levator ani   PROLAPSE: Not able to tell this session due to dyssynergic pelvic floor contraction     06/01/22: COGNITION: Overall cognitive status: Within functional limits for tasks assessed     SENSATION: Light touch: Appears intact Proprioception: Appears intact  GAIT: Comments: decreased hip extension, forward flexed trunk  POSTURE: rounded shoulders, forward head, decreased lumbar lordosis, increased thoracic kyphosis, posterior pelvic tilt, and flexed trunk    LUMBARAROM/PROM:  A/PROM  A/PROM  Eval (% available)  Flexion 50  Extension 25, pain anteriorly   Right lateral flexion 50, pain anteriorly   Left lateral flexion 50, pain anteriorly    (Blank rows = not tested)   PALPATION:   General  Significant abdominal restriction and tenderness; decreased rib mobility with mobilization and breathing    TODAY'S TREATMENT:                                                                                                                              DATE:  09/21/22 Manual: Trigger Point Dry-Needling  Treatment instructions: Expect mild to moderate muscle soreness. S/S of pneumothorax if dry needled over a lung field, and to seek immediate medical attention should they occur. Patient verbalized understanding of these instructions and education.  Patient Consent Given: No Education handout provided: Yes Muscles treated: Multifidi T12-L5, bil glutes/piriformis Electrical stimulation performed: No Parameters: N/A Treatment response/outcome: twitch response/release Soft tissue mobilization/Instrument assisted soft tissue mobilization to lumbar paraspinals  Abdominal soft tissue mobilization    09/15/22 Manual: Trigger Point Dry-Needling  Treatment instructions: Expect mild to moderate muscle soreness. S/S of pneumothorax if dry needled over a lung field, and to seek  immediate medical attention should they occur. Patient verbalized understanding of these instructions and education.  Patient Consent Given: No Education handout provided: Yes Muscles treated: Multifidi T12-L5, bil glutes/piriformis Electrical stimulation performed: No Parameters: N/A Treatment response/outcome: twitch response/release Soft tissue mobilization/Instrument assisted soft tissue mobilization to lumbar paraspinals  Abdominal soft tissue mobilization Therapeutic activities: Diet Increasing fiber slowly Less processed food having more beneficial fiber Possible benefit of building bulk in bowel  movements to encourage normal peristalsis and decrease frequency of bowel movements - having more complete movements    09/08/22 Manual: Trigger Point Dry-Needling  Treatment instructions: Expect mild to moderate muscle soreness. S/S of pneumothorax if dry needled over a lung field, and to seek immediate medical attention should they occur. Patient verbalized understanding of these instructions and education.  Patient Consent Given: No Education handout provided: Yes Muscles treated: Multifidi T12-L5, bil glutes/piriformis Electrical stimulation performed: No Parameters: N/A Treatment response/outcome: twitch response/release Soft tissue mobilization/Instrument assisted soft tissue mobilization to lumbar paraspinals  Exercises: Open books 10x bil Bridge with hip adduction 10x Seated lateral flexion 10 breaths bil Standing adductor stretch 60 sec bil Seated forward fold 10 breaths    PATIENT EDUCATION:  Education details: See above Person educated: Patient Education method: Programmer, multimedia, Demonstration, Tactile cues, Verbal cues, and Handouts Education comprehension: verbalized understanding  HOME EXERCISE PROGRAM: [id number]  ASSESSMENT:  CLINICAL IMPRESSION: Pt sees some improvement with abdominal comfort after each session, but it does not appear to be helpful long term. She has not developed symptoms of stool burden again, but has worsening vaginal irritation and returns to MD next week for next steps. We discussed possible endometriosis contribution to GI motility difficulties and she as encouraged to pursue this further. Good tolerance ot all manual techniques and dry needling today. She will continue to benefit from skilled PT intervention in order to decrease pain, improve bowel movements, and decrease urinary urgency/incontinence.     OBJECTIVE IMPAIRMENTS: decreased activity tolerance, decreased coordination, decreased endurance, decreased mobility, decreased strength,  increased fascial restrictions, increased muscle spasms, impaired tone, postural dysfunction, and pain.   ACTIVITY LIMITATIONS: lifting, bending, continence, and locomotion level  PARTICIPATION LIMITATIONS: interpersonal relationship, community activity, and occupation  PERSONAL FACTORS: 1 comorbidity: medical history  are also affecting patient's functional outcome.   REHAB POTENTIAL: Good  CLINICAL DECISION MAKING: Stable/uncomplicated  EVALUATION COMPLEXITY: Low   GOALS: Goals reviewed with patient? Yes  SHORT TERM GOALS: Target date: 07/06/22 - updated 07/12/22 - updated 08/18/22 - updated 09/22/22  Pt will be independent with HEP.   Baseline: Goal status: MET 07/12/22  2.  Pt will be independent with diaphragmatic breathing and down training activities in order to improve pelvic floor relaxation.  Baseline:  Goal status: MET 07/12/22  3.  Pt will be independent with the knack, urge suppression technique, and double voiding in order to improve bladder habits and decrease urinary incontinence.   Baseline:  Goal status: MET 09/22/22  4.  Pt will be independent with use of squatty potty, relaxed toileting mechanics, and improved bowel movement techniques in order to increase ease of bowel movements and complete evacuation.   Baseline:  Goal status:IN PROGRESS  5.  Pt will be able to correctly perform diaphragmatic breathing and appropriate pressure management in order to prevent worsening vaginal wall laxity and improve pelvic floor A/ROM.   Baseline:  Goal status: IN PROGRESS  LONG TERM GOALS: Target date: 11/16/2022 - updated 07/12/22 - updated 08/18/22 - updated 09/22/22  Pt will be independent with  advanced HEP.   Baseline:  Goal status: IN PROGRESS  2.  Pt will demonstrate normal pelvic floor muscle tone and A/ROM, able to achieve 4/5 strength with contractions and 10 sec endurance, in order to provide appropriate lumbopelvic support in functional activities.   Baseline:   Goal status: IN PROGRESS  3.  Pt will increase all impaired lumbar A/ROM by 25% without pain.  Baseline:  Goal status:  IN PROGRESS  4.  Pt will report pain no higher than 3/10 with any activity. Baseline:  Goal status:  IN PROGRESS  5.  Pt will report 0/10 pain with vaginal penetration in order to improve intimate relationship with partner.    Baseline:  Goal status:  IN PROGRESS  6.  Pt will be able to go 2-3 hours in between voids without urgency or incontinence in order to improve QOL and perform all functional activities with less difficulty.   Baseline:  Goal status: IN PROGRESS  7.  Pt will report no leaks with laughing, coughing, sneezing in order to improve comfort with interpersonal relationships and community activities.   Baseline:  Goal status:  IN PROGRESS  8.  Pt will have consistent bowel movements 4-5x/week without straining or pain.  Baseline:  Goal status:  IN PROGRESS  PLAN:  PT FREQUENCY: 1-2x/week  PT DURATION: 6 months  PLANNED INTERVENTIONS: Therapeutic exercises, Therapeutic activity, Neuromuscular re-education, Balance training, Gait training, Patient/Family education, Self Care, Joint mobilization, Dry Needling, Biofeedback, and Manual therapy  PLAN FOR NEXT SESSION: Progress core and postural strengthening, manual techniques as needed - 2 more visits.   Julio Alm, PT, DPT09/19/2412:29 PM

## 2022-09-27 ENCOUNTER — Telehealth (HOSPITAL_BASED_OUTPATIENT_CLINIC_OR_DEPARTMENT_OTHER): Payer: Self-pay

## 2022-09-27 NOTE — Telephone Encounter (Signed)
Pt called in and stated she still has burning and irritation around the vaginal area. She stated the skin is breaking and she wanted advice or recommendations. Is there anything we can do for her before her appt on 10/05/22.

## 2022-09-28 ENCOUNTER — Other Ambulatory Visit (HOSPITAL_COMMUNITY): Payer: Self-pay

## 2022-09-28 DIAGNOSIS — L280 Lichen simplex chronicus: Secondary | ICD-10-CM | POA: Diagnosis not present

## 2022-09-28 MED ORDER — TRIAMCINOLONE ACETONIDE 0.1 % EX OINT
TOPICAL_OINTMENT | CUTANEOUS | 3 refills | Status: AC
  Filled 2022-09-28: qty 80, 14d supply, fill #0
  Filled 2023-03-02: qty 80, 14d supply, fill #1

## 2022-09-29 ENCOUNTER — Ambulatory Visit: Payer: Commercial Managed Care - PPO

## 2022-09-29 DIAGNOSIS — R279 Unspecified lack of coordination: Secondary | ICD-10-CM | POA: Diagnosis not present

## 2022-09-29 DIAGNOSIS — N393 Stress incontinence (female) (male): Secondary | ICD-10-CM | POA: Diagnosis not present

## 2022-09-29 DIAGNOSIS — M5459 Other low back pain: Secondary | ICD-10-CM | POA: Diagnosis not present

## 2022-09-29 DIAGNOSIS — M62838 Other muscle spasm: Secondary | ICD-10-CM | POA: Diagnosis not present

## 2022-09-29 DIAGNOSIS — M6281 Muscle weakness (generalized): Secondary | ICD-10-CM

## 2022-09-29 DIAGNOSIS — N3941 Urge incontinence: Secondary | ICD-10-CM | POA: Diagnosis not present

## 2022-09-29 DIAGNOSIS — R102 Pelvic and perineal pain: Secondary | ICD-10-CM

## 2022-09-29 NOTE — Therapy (Signed)
OUTPATIENT PHYSICAL THERAPY FEMALE PELVIC TREATMENT   Patient Name: Karen [name] MRN: [id number] DOB:09/08/1970, 52 y.o., female Today's Date: 09/29/2022  END OF SESSION:  PT End of Session - 09/29/22 1153     Visit Number 15    Date for PT Re-Evaluation 11/16/22    Authorization Type [employer]    PT Start Time 1148    PT Stop Time 1226    PT Time Calculation (min) 38 min    Activity Tolerance Patient tolerated treatment well    Behavior During Therapy [facility] for tasks assessed/performed                  Past Medical History:  Diagnosis Date   Adenomyosis    Anemia    Arthritis 2013   L knee gets steroid injections    Asthmatic bronchitis 01/31/2017   Back pain    Chronic constipation    Diarrhea    DVT of lower extremity (deep venous thrombosis) (HCC)    Dysmenorrhea    Endometriosis    Esophageal stricture    GERD (gastroesophageal reflux disease)    History of kidney stones    History of uterine fibroid    IBS (irritable bowel syndrome)    Interstitial cystitis    Lactose intolerance    Migraine    with aura   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   PONV (postoperative nausea and vomiting)    Recurrent upper respiratory infection (URI)    Sebaceous cyst of breast, right lower inner quadrant 10/25/2012   Excised 11/21/12    Sleep apnea    Syncope, non cardiac    Past Surgical History:  Procedure Laterality Date   ARTHROSCOPIC HAGLUNDS REPAIR     BREAST CYST EXCISION Right 11/21/2012   Procedure: CYST EXCISION BREAST;  Surgeon: Currie Paris, MD;  Location: [facility];  Service: General;  Laterality: Right;   BREAST CYST EXCISION Bilateral 01/18/2022   Procedure: EXCISION OF SEBACEOUS CYST BILATERAL BREAST;  Surgeon: Harriette Bouillon, MD;  Location: [facility];  Service: General;  Laterality: Bilateral;   BREAST EXCISIONAL BIOPSY Right 11/2012   CESAREAN SECTION  04/19/1993   CHOLECYSTECTOMY     COLONOSCOPY   05/02/2011   Procedure: COLONOSCOPY;  Surgeon: Vertell Novak., MD;  Location: [facility];  Service: Endoscopy;  Laterality: N/A;   colonoscopy  11/04/2014   DENTAL SURGERY  03/06/2018   2 surgeries ( 03/06/2018 and 04/06/2018)   to remove two separate benign tumors   ESOPHAGEAL MANOMETRY N/A 11/04/2015   Procedure: ESOPHAGEAL MANOMETRY (EM);  Surgeon: Carman Ching, MD;  Location: [facility];  Service: Endoscopy;  Laterality: N/A;   ESOPHAGOGASTRODUODENOSCOPY  05/02/2011   Procedure: ESOPHAGOGASTRODUODENOSCOPY (EGD);  Surgeon: Vertell Novak., MD;  Location: [facility];  Service: Endoscopy;  Laterality: N/A;   ESOPHAGOGASTRODUODENOSCOPY N/A 09/01/2014   Procedure: ESOPHAGOGASTRODUODENOSCOPY (EGD);  Surgeon: Carman Ching, MD;  Location: [facility];  Service: Endoscopy;  Laterality: N/A;   KNEE SURGERY     LAPAROSCOPY     x 4   LESION REMOVAL N/A 01/18/2022   Procedure: EXCISION OF SEBACEOUS CYSTS CHEST AND NECK;  Surgeon: Harriette Bouillon, MD;  Location: [facility];  Service: General;  Laterality: N/A;   PH IMPEDANCE STUDY N/A 11/04/2015   Procedure: PH IMPEDANCE STUDY;  Surgeon: Carman Ching, MD;  Location: [facility];  Service: Endoscopy;  Laterality: N/A;   VAGINAL HYSTERECTOMY  2010   TVH--ovaries remain   Patient Active  Problem List   Diagnosis Date Noted   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   Elevated LFTs 04/04/2021   Colitis 04/04/2021   Bacterial overgrowth syndrome 05/21/2020   Obesity 05/21/2020   History of endometriosis 05/21/2020   Constipation due to outlet dysfunction 02/05/2020   Gastroesophageal reflux disease 02/05/2020   Obstructive sleep apnea treated with continuous positive airway pressure (CPAP) 06/16/2017   Perennial allergic rhinitis with a nonallergic component 01/31/2017   Asthmatic bronchitis 01/31/2017   GI symptoms 01/31/2017   Family history of colon cancer 01/27/2015   Chest pain 12/13/2012   Dyspnea 12/13/2012   DVT of  lower extremity (deep venous thrombosis) (HCC) 01/03/1990    PCP: Lorenda Ishihara, MD  REFERRING PROVIDER: Jerene Bears, MD   REFERRING DIAG: M62.89 (ICD-10-CM) - Pelvic floor dysfunction  THERAPY DIAG:  Pelvic pain  Other low back pain  Muscle weakness (generalized)  Other muscle spasm  Unspecified lack of coordination  Rationale for Evaluation and Treatment: Rehabilitation  ONSET DATE: chronic  SUBJECTIVE:                                                                                                                                                                                           SUBJECTIVE STATEMENT: Pt is having a rough time with vulvar skin and feels like she is starting to get more breakdown. She has been to see dermatologist and they changed cream and told her to continue using aquaphor. She is having 4-9 bowel movements a day, but feels like there is more bulk. She started taking Beano and feels like that has helped her feel less full.   PAIN:  Are you having pain? Yes NPRS scale: 3/10 Pain location:  Lower abdominal pain, Lt sided pain, low back pain  Pain type: turning, knotted, pressure Pain description: intermittent and constant   Aggravating factors: constipation or frequent bowel movements/constantly having bowel movements  Relieving factors: exercises (bowel massage, happy baby, spinal twists, walking)  PRECAUTIONS: None  WEIGHT BEARING RESTRICTIONS: No  FALLS:  Has patient fallen in last 6 months? No  LIVING ENVIRONMENT: Lives with: lives with their spouse Lives in: House/apartment  OCCUPATION: nurse, in admin  PLOF: Independent  PATIENT GOALS: decrease pain and have more normal bowel movements  PERTINENT HISTORY:  Vaginal hysterectomy, DVT LE, endometriosis, interstitial cystitis, exploratory laps for endometriosis, c-section, cholecystectomy Sexual abuse: No  BOWEL MOVEMENT: Pain with bowel movement: Yes Type of bowel  movement:Type (Bristol Stool Scale) 1-7 (sometimes full range in the same bowel movement), Frequency sometimes many times a day, sometimes up to 4 days in between, and Strain Yes Fully empty rectum: No  Leakage: No Pads: No Fiber supplement: No - has attempted metamucil and it made constipation worse  URINATION: Pain with urination: Yes Fully empty bladder: No Stream:  varies  Urgency: Yes: all the time Frequency: multiple times an hours  Leakage: Urge to void, Walking to the bathroom, Coughing, Sneezing, Laughing, and Exercise Pads: Yes: daily  INTERCOURSE: Pain with intercourse: Initial Penetration and During Penetration Ability to have vaginal penetration:  Yes: with pain Climax: non painful WNL   PREGNANCY: Vaginal deliveries 1 Tearing Yes: 3rd degree tear C-section deliveries 1 Currently pregnant No  PROLAPSE: Periodically will feel vaginal bulging, heaviness in lower abdomen   OBJECTIVE:  07/21/22: standing prolapse assessment demonstrated grade 2 anterior vaginal wall laxity   06/08/22:               External Perineal Exam: WNL                              Internal Pelvic Floor: burning reported with palpation of superficial muscles; deep aching/pressure with palpation of Lt levator ani   Patient confirms identification and approves PT to assess internal pelvic floor and treatment Yes  PELVIC MMT:   MMT eval  Vaginal 3/5  Internal Anal Sphincter 1/5  External Anal Sphincter 2/5  Puborectalis 1/5  Diastasis Recti   (Blank rows = not tested)        TONE: High in Lt levator ani   PROLAPSE: Not able to tell this session due to dyssynergic pelvic floor contraction     06/01/22: COGNITION: Overall cognitive status: Within functional limits for tasks assessed     SENSATION: Light touch: Appears intact Proprioception: Appears intact  GAIT: Comments: decreased hip extension, forward flexed trunk  POSTURE: rounded shoulders, forward head, decreased lumbar  lordosis, increased thoracic kyphosis, posterior pelvic tilt, and flexed trunk    LUMBARAROM/PROM:  A/PROM A/PROM  Eval (% available)  Flexion 50  Extension 25, pain anteriorly   Right lateral flexion 50, pain anteriorly   Left lateral flexion 50, pain anteriorly    (Blank rows = not tested)   PALPATION:   General  Significant abdominal restriction and tenderness; decreased rib mobility with mobilization and breathing    TODAY'S TREATMENT:                                                                                                                              DATE:  09/29/22 Manual: Abdominal soft tissue mobilization Bowel mobilization Neuromuscular re-education: Attended e-stim for stimulation of posterior tibial nerve to help improve constipation/motility of bowels with education during 20 minutes  09/21/22 Manual: Trigger Point Dry-Needling  Treatment instructions: Expect mild to moderate muscle soreness. S/S of pneumothorax if dry needled over a lung field, and to seek immediate medical attention should they occur. Patient verbalized understanding of these instructions and education.  Patient Consent Given: No Education handout provided: Yes Muscles treated: Multifidi  T12-L5, bil glutes/piriformis Electrical stimulation performed: No Parameters: N/A Treatment response/outcome: twitch response/release Soft tissue mobilization/Instrument assisted soft tissue mobilization to lumbar paraspinals  Abdominal soft tissue mobilization    09/15/22 Manual: Trigger Point Dry-Needling  Treatment instructions: Expect mild to moderate muscle soreness. S/S of pneumothorax if dry needled over a lung field, and to seek immediate medical attention should they occur. Patient verbalized understanding of these instructions and education.  Patient Consent Given: No Education handout provided: Yes Muscles treated: Multifidi T12-L5, bil glutes/piriformis Electrical stimulation  performed: No Parameters: N/A Treatment response/outcome: twitch response/release Soft tissue mobilization/Instrument assisted soft tissue mobilization to lumbar paraspinals  Abdominal soft tissue mobilization Therapeutic activities: Diet Increasing fiber slowly Less processed food having more beneficial fiber Possible benefit of building bulk in bowel movements to encourage normal peristalsis and decrease frequency of bowel movements - having more complete movements   PATIENT EDUCATION:  Education details: See above Person educated: Patient Education method: Explanation, Demonstration, Tactile cues, Verbal cues, and Handouts Education comprehension: verbalized understanding  HOME EXERCISE PROGRAM: [id number]  ASSESSMENT:  CLINICAL IMPRESSION: E-stim protocol used specifically for constipation/increased bowel motility this session to test tolerance. After about 10 minutes, she started having mild tingling in bil groin, but reported that it was not uncomfortable. We will see if this changes bowel movements at all over the next several days. Continued good tolerance to bowel mobilization. She will continue to benefit from skilled PT intervention in order to decrease pain, improve bowel movements, and decrease urinary urgency/incontinence.     OBJECTIVE IMPAIRMENTS: decreased activity tolerance, decreased coordination, decreased endurance, decreased mobility, decreased strength, increased fascial restrictions, increased muscle spasms, impaired tone, postural dysfunction, and pain.   ACTIVITY LIMITATIONS: lifting, bending, continence, and locomotion level  PARTICIPATION LIMITATIONS: interpersonal relationship, community activity, and occupation  PERSONAL FACTORS: 1 comorbidity: medical history  are also affecting patient's functional outcome.   REHAB POTENTIAL: Good  CLINICAL DECISION MAKING: Stable/uncomplicated  EVALUATION COMPLEXITY: Low   GOALS: Goals reviewed with patient?  Yes  SHORT TERM GOALS: Target date: 07/06/22 - updated 07/12/22 - updated 08/18/22 - updated 09/22/22  Pt will be independent with HEP.   Baseline: Goal status: MET 07/12/22  2.  Pt will be independent with diaphragmatic breathing and down training activities in order to improve pelvic floor relaxation.  Baseline:  Goal status: MET 07/12/22  3.  Pt will be independent with the knack, urge suppression technique, and double voiding in order to improve bladder habits and decrease urinary incontinence.   Baseline:  Goal status: MET 09/22/22  4.  Pt will be independent with use of squatty potty, relaxed toileting mechanics, and improved bowel movement techniques in order to increase ease of bowel movements and complete evacuation.   Baseline:  Goal status:IN PROGRESS  5.  Pt will be able to correctly perform diaphragmatic breathing and appropriate pressure management in order to prevent worsening vaginal wall laxity and improve pelvic floor A/ROM.   Baseline:  Goal status: IN PROGRESS  LONG TERM GOALS: Target date: 11/16/2022 - updated 07/12/22 - updated 08/18/22 - updated 09/22/22  Pt will be independent with advanced HEP.   Baseline:  Goal status: IN PROGRESS  2.  Pt will demonstrate normal pelvic floor muscle tone and A/ROM, able to achieve 4/5 strength with contractions and 10 sec endurance, in order to provide appropriate lumbopelvic support in functional activities.   Baseline:  Goal status: IN PROGRESS  3.  Pt will increase all impaired lumbar A/ROM by 25% without pain.  Baseline:  Goal status:  IN PROGRESS  4.  Pt will report pain no higher than 3/10 with any activity. Baseline:  Goal status:  IN PROGRESS  5.  Pt will report 0/10 pain with vaginal penetration in order to improve intimate relationship with partner.    Baseline:  Goal status:  IN PROGRESS  6.  Pt will be able to go 2-3 hours in between voids without urgency or incontinence in order to improve QOL and perform  all functional activities with less difficulty.   Baseline:  Goal status: IN PROGRESS  7.  Pt will report no leaks with laughing, coughing, sneezing in order to improve comfort with interpersonal relationships and community activities.   Baseline:  Goal status:  IN PROGRESS  8.  Pt will have consistent bowel movements 4-5x/week without straining or pain.  Baseline:  Goal status:  IN PROGRESS  PLAN:  PT FREQUENCY: 1-2x/week  PT DURATION: 6 months  PLANNED INTERVENTIONS: Therapeutic exercises, Therapeutic activity, Neuromuscular re-education, Balance training, Gait training, Patient/Family education, Self Care, Joint mobilization, Dry Needling, Biofeedback, and Manual therapy  PLAN FOR NEXT SESSION: Progress core and postural strengthening, manual techniques as needed - 2 more visits.   Julio Alm, PT, DPT09/26/2412:28 PM

## 2022-10-04 ENCOUNTER — Ambulatory Visit: Payer: Commercial Managed Care - PPO

## 2022-10-05 ENCOUNTER — Encounter (HOSPITAL_BASED_OUTPATIENT_CLINIC_OR_DEPARTMENT_OTHER): Payer: Self-pay | Admitting: Obstetrics & Gynecology

## 2022-10-05 ENCOUNTER — Ambulatory Visit (HOSPITAL_BASED_OUTPATIENT_CLINIC_OR_DEPARTMENT_OTHER): Payer: Commercial Managed Care - PPO | Admitting: Obstetrics & Gynecology

## 2022-10-05 VITALS — BP 116/79 | HR 88 | Ht 67.5 in | Wt 204.8 lb

## 2022-10-05 DIAGNOSIS — N9089 Other specified noninflammatory disorders of vulva and perineum: Secondary | ICD-10-CM

## 2022-10-05 DIAGNOSIS — Z1231 Encounter for screening mammogram for malignant neoplasm of breast: Secondary | ICD-10-CM

## 2022-10-05 DIAGNOSIS — L280 Lichen simplex chronicus: Secondary | ICD-10-CM | POA: Diagnosis not present

## 2022-10-05 NOTE — Patient Instructions (Addendum)
[facility] Dermatology - [facility] [address] [place], [place] [address]  [phone]

## 2022-10-05 NOTE — Progress Notes (Signed)
GYNECOLOGY  VISIT  CC:   vulvar irritation follow  HPI: 52 y.o. G2P2 Married [place] American female here for recheck of vulvar irritation.  Had biopsy on 08/22/2022 showing chronic inflammation and slight hyperkeratosis.  Negative for dysplasia or malignancy.  Initially she really didn't have much improvement with topical steroid that was prescribed.  She saw a provider at [facility] dermatology and used Kenolog twice daily x 2 weeks, then daily for a week and then every other day.   Now is having some improvement.   Follow up 1 month is planned.  She would like to see specialist if possible    Still with GI issues including more recently anal fissures.  She is being treated for this as well.  Just has so much going on from GI standpoint and has tried many options without any major improvement.  Still having to do cleansing as well.  Unrelated, pt does need mammogram so order will be placed.     Past Medical History:  Diagnosis Date   Adenomyosis    Anemia    Arthritis 2013   L knee gets steroid injections    Asthmatic bronchitis 01/31/2017   Back pain    Chronic constipation    Diarrhea    DVT of lower extremity (deep venous thrombosis) (HCC)    Dysmenorrhea    Endometriosis    Esophageal stricture    GERD (gastroesophageal reflux disease)    History of kidney stones    History of uterine fibroid    IBS (irritable bowel syndrome)    Interstitial cystitis    Lactose intolerance    Migraine    with aura   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   PONV (postoperative nausea and vomiting)    Recurrent upper respiratory infection (URI)    Sebaceous cyst of breast, right lower inner quadrant 10/25/2012   Excised 11/21/12    Sleep apnea    Syncope, non cardiac     MEDS:   Current Outpatient Medications on File Prior to Visit  Medication Sig Dispense Refill   albuterol (VENTOLIN HFA) 108 (90 Base) MCG/ACT inhaler Inhale 2 puffs into the lungs every 6 (six) hours as needed for  wheezing or shortness of breath. 6.7 g 2   Elderberry 575 MG/5ML SYRP Take 30 mLs by mouth daily.     fluticasone (FLONASE) 50 MCG/ACT nasal spray Place 2 sprays into both nostrils daily. 16 g 5   hydrOXYzine (ATARAX) 25 MG tablet Take 1 tablet (25 mg total) by mouth at bedtime as needed. 30 tablet 1   Lactobacillus Casei-Folic Acid (RESTORA RX) 60-1.25 MG CAPS Take 1 capsule by mouth daily between meals 30 capsule 5   lansoprazole (PREVACID) 30 MG capsule Take 1 capsule (30 mg total) by mouth daily. 90 capsule 3   levocetirizine (XYZAL) 5 MG tablet Take 1 tablet (5 mg total) by mouth every evening. (Patient taking differently: Take 5 mg by mouth daily as needed for allergies.) 30 tablet 5   Lifitegrast (XIIDRA) 5 % SOLN Place 1 drop into both eyes 2 (two) times daily. 120 each 0   polyethylene glycol (MIRALAX / GLYCOLAX) 17 g packet Take 17 g by mouth every other day.     triamcinolone ointment (KENALOG) 0.1 % Apply to affected area 2  times daily for 14 days, THEN daily for 14 days, THEN 1  every other day as needed for flare 80 g 3   Acetaminophen 500 MG capsule 1 capsule as needed Orally every  6 hrs     clobetasol ointment (TEMOVATE) 0.05 % Apply 1 Application topically as directed 2 (two) times daily. (Patient not taking: Reported on 10/05/2022) 60 g 0   lansoprazole (PREVACID) 15 MG capsule Take 1 capsule (15 mg total) by mouth daily. (Patient not taking: Reported on 07/11/2022) 90 capsule 2   ondansetron (ZOFRAN-ODT) 4 MG disintegrating tablet Dissolve 1 tablet (4 mg total) in mouth every 8 (eight) hours as needed for nausea (Patient not taking: Reported on 10/05/2022) 30 tablet 2   No current facility-administered medications on file prior to visit.    ALLERGIES: Molds & smuts  SH:  married, non smoker  Review of Systems  Constitutional: Negative.   Genitourinary:        Vulvar irritation/itching    PHYSICAL EXAMINATION:    BP 116/79 (BP Location: Left Arm, Patient Position:  Sitting, Cuff Size: Normal)   Pulse 88   Ht 5' 7.5" (1.715 m)   Wt 204 lb 12.8 oz (92.9 kg)   BMI 31.60 kg/m      Physical Exam Constitutional:      Appearance: Normal appearance.  Genitourinary:    Labia:        Right: No rash.        Left: No rash.      Comments: No new skin changes Lymphadenopathy:     Lower Body: No right inguinal adenopathy. No left inguinal adenopathy.  Neurological:     General: No focal deficit present.     Mental Status: She is alert.  Psychiatric:        Mood and Affect: Mood normal.     Assessment/Plan: 1. Vulvar irritation - will refer to Dr. Delanna Ahmadi at [facility] for second opinion - Ambulatory referral to Dermatology  2. Encounter for screening mammogram for malignant neoplasm of breast - MM 3D SCREENING MAMMOGRAM BILATERAL BREAST; Future  Total time with pt:  22 minutes

## 2022-10-10 ENCOUNTER — Ambulatory Visit: Payer: Commercial Managed Care - PPO | Attending: Obstetrics & Gynecology

## 2022-10-10 DIAGNOSIS — R279 Unspecified lack of coordination: Secondary | ICD-10-CM | POA: Insufficient documentation

## 2022-10-10 DIAGNOSIS — M6281 Muscle weakness (generalized): Secondary | ICD-10-CM | POA: Diagnosis not present

## 2022-10-10 DIAGNOSIS — M5459 Other low back pain: Secondary | ICD-10-CM | POA: Insufficient documentation

## 2022-10-10 DIAGNOSIS — R102 Pelvic and perineal pain: Secondary | ICD-10-CM | POA: Diagnosis not present

## 2022-10-10 DIAGNOSIS — M62838 Other muscle spasm: Secondary | ICD-10-CM | POA: Diagnosis not present

## 2022-10-10 NOTE — Therapy (Signed)
OUTPATIENT PHYSICAL THERAPY FEMALE PELVIC TREATMENT   Patient Name: ROSILEE NAUERT MRN: [id number] DOB:1970/03/19, 52 y.o., female Today's Date: 10/10/2022  END OF SESSION:  PT End of Session - 10/10/22 1406     Visit Number 16    Date for PT Re-Evaluation 11/16/22    Authorization Type [employer]    PT Start Time 1404    PT Stop Time 1442    PT Time Calculation (min) 38 min    Activity Tolerance Patient tolerated treatment well    Behavior During Therapy [facility] for tasks assessed/performed                  Past Medical History:  Diagnosis Date   Adenomyosis    Anemia    Arthritis 2013   L knee gets steroid injections    Asthmatic bronchitis 01/31/2017   Back pain    Chronic constipation    Diarrhea    DVT of lower extremity (deep venous thrombosis) (HCC)    Dysmenorrhea    Endometriosis    Esophageal stricture    GERD (gastroesophageal reflux disease)    History of kidney stones    History of uterine fibroid    IBS (irritable bowel syndrome)    Interstitial cystitis    Lactose intolerance    Migraine    with aura   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   PONV (postoperative nausea and vomiting)    Recurrent upper respiratory infection (URI)    Sebaceous cyst of breast, right lower inner quadrant 10/25/2012   Excised 11/21/12    Sleep apnea    Syncope, non cardiac    Past Surgical History:  Procedure Laterality Date   ARTHROSCOPIC HAGLUNDS REPAIR     BREAST CYST EXCISION Right 11/21/2012   Procedure: CYST EXCISION BREAST;  Surgeon: Currie Paris, MD;  Location: [facility];  Service: General;  Laterality: Right;   BREAST CYST EXCISION Bilateral 01/18/2022   Procedure: EXCISION OF SEBACEOUS CYST BILATERAL BREAST;  Surgeon: Harriette Bouillon, MD;  Location: [facility];  Service: General;  Laterality: Bilateral;   BREAST EXCISIONAL BIOPSY Right 11/2012   CESAREAN SECTION  04/19/1993   CHOLECYSTECTOMY     COLONOSCOPY   05/02/2011   Procedure: COLONOSCOPY;  Surgeon: Vertell Novak., MD;  Location: [facility];  Service: Endoscopy;  Laterality: N/A;   colonoscopy  11/04/2014   DENTAL SURGERY  03/06/2018   2 surgeries ( 03/06/2018 and 04/06/2018)   to remove two separate benign tumors   ESOPHAGEAL MANOMETRY N/A 11/04/2015   Procedure: ESOPHAGEAL MANOMETRY (EM);  Surgeon: Carman Ching, MD;  Location: [facility];  Service: Endoscopy;  Laterality: N/A;   ESOPHAGOGASTRODUODENOSCOPY  05/02/2011   Procedure: ESOPHAGOGASTRODUODENOSCOPY (EGD);  Surgeon: Vertell Novak., MD;  Location: [facility];  Service: Endoscopy;  Laterality: N/A;   ESOPHAGOGASTRODUODENOSCOPY N/A 09/01/2014   Procedure: ESOPHAGOGASTRODUODENOSCOPY (EGD);  Surgeon: Carman Ching, MD;  Location: [facility];  Service: Endoscopy;  Laterality: N/A;   KNEE SURGERY     LAPAROSCOPY     x 4   LESION REMOVAL N/A 01/18/2022   Procedure: EXCISION OF SEBACEOUS CYSTS CHEST AND NECK;  Surgeon: Harriette Bouillon, MD;  Location:  S[facility]ERY [facility];  Service: General;  Laterality: N/A;   PH IMPEDANCE STUDY N/A 11/04/2015   Procedure: PH IMPEDANCE STUDY;  Surgeon: Carman Ching, MD;  Location: [facility];  Service: Endoscopy;  Laterality: N/A;   VAGINAL HYSTERECTOMY  2010   TVH--ovaries remain   Patient Active  Problem List   Diagnosis Date Noted   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   Elevated LFTs 04/04/2021   Colitis 04/04/2021   Bacterial overgrowth syndrome 05/21/2020   Obesity 05/21/2020   History of endometriosis 05/21/2020   Constipation due to outlet dysfunction 02/05/2020   Gastroesophageal reflux disease 02/05/2020   Obstructive sleep apnea treated with continuous positive airway pressure (CPAP) 06/16/2017   Perennial allergic rhinitis with a nonallergic component 01/31/2017   Asthmatic bronchitis 01/31/2017   GI symptoms 01/31/2017   Family history of colon cancer 01/27/2015   Chest pain 12/13/2012   Dyspnea 12/13/2012   DVT of  lower extremity (deep venous thrombosis) (HCC) 01/03/1990    PCP: Lorenda Ishihara, MD  REFERRING PROVIDER: Jerene Bears, MD   REFERRING DIAG: M62.89 (ICD-10-CM) - Pelvic floor dysfunction  THERAPY DIAG:  Pelvic pain  Other low back pain  Muscle weakness (generalized)  Other muscle spasm  Unspecified lack of coordination  Rationale for Evaluation and Treatment: Rehabilitation  ONSET DATE: chronic  SUBJECTIVE:                                                                                                                                                                                           SUBJECTIVE STATEMENT: Pt states that she saw gynecologist last week and vulvar irritation is looking better. She felt like after using the TENs last visit she felt like bowel movements were more productive. She has had some low back pain, but states that it was a little better with more productive bowel movements.   PAIN:  Are you having pain? Yes NPRS scale: 2/10 Pain location:  Lower abdominal pain, Lt sided pain, low back pain  Pain type: turning, knotted, pressure Pain description: intermittent and constant   Aggravating factors: constipation or frequent bowel movements/constantly having bowel movements  Relieving factors: exercises (bowel massage, happy baby, spinal twists, walking)  PRECAUTIONS: None  WEIGHT BEARING RESTRICTIONS: No  FALLS:  Has patient fallen in last 6 months? No  LIVING ENVIRONMENT: Lives with: lives with their spouse Lives in: House/apartment  OCCUPATION: nurse, in admin  PLOF: Independent  PATIENT GOALS: decrease pain and have more normal bowel movements  PERTINENT HISTORY:  Vaginal hysterectomy, DVT LE, endometriosis, interstitial cystitis, exploratory laps for endometriosis, c-section, cholecystectomy Sexual abuse: No  BOWEL MOVEMENT: Pain with bowel movement: Yes Type of bowel movement:Type (Bristol Stool Scale) 1-7 (sometimes  full range in the same bowel movement), Frequency sometimes many times a day, sometimes up to 4 days in between, and Strain Yes Fully empty rectum: No Leakage: No Pads: No Fiber supplement: No - has attempted metamucil and it made  constipation worse  URINATION: Pain with urination: Yes Fully empty bladder: No Stream:  varies  Urgency: Yes: all the time Frequency: multiple times an hours  Leakage: Urge to void, Walking to the bathroom, Coughing, Sneezing, Laughing, and Exercise Pads: Yes: daily  INTERCOURSE: Pain with intercourse: Initial Penetration and During Penetration Ability to have vaginal penetration:  Yes: with pain Climax: non painful WNL   PREGNANCY: Vaginal deliveries 1 Tearing Yes: 3rd degree tear C-section deliveries 1 Currently pregnant No  PROLAPSE: Periodically will feel vaginal bulging, heaviness in lower abdomen   OBJECTIVE:  07/21/22: standing prolapse assessment demonstrated grade 2 anterior vaginal wall laxity   06/08/22:               External Perineal Exam: WNL                              Internal Pelvic Floor: burning reported with palpation of superficial muscles; deep aching/pressure with palpation of Lt levator ani   Patient confirms identification and approves PT to assess internal pelvic floor and treatment Yes  PELVIC MMT:   MMT eval  Vaginal 3/5  Internal Anal Sphincter 1/5  External Anal Sphincter 2/5  Puborectalis 1/5  Diastasis Recti   (Blank rows = not tested)        TONE: High in Lt levator ani   PROLAPSE: Not able to tell this session due to dyssynergic pelvic floor contraction     06/01/22: COGNITION: Overall cognitive status: Within functional limits for tasks assessed     SENSATION: Light touch: Appears intact Proprioception: Appears intact  GAIT: Comments: decreased hip extension, forward flexed trunk  POSTURE: rounded shoulders, forward head, decreased lumbar lordosis, increased thoracic kyphosis, posterior  pelvic tilt, and flexed trunk    LUMBARAROM/PROM:  A/PROM A/PROM  Eval (% available)  Flexion 50  Extension 25, pain anteriorly   Right lateral flexion 50, pain anteriorly   Left lateral flexion 50, pain anteriorly    (Blank rows = not tested)   PALPATION:   General  Significant abdominal restriction and tenderness; decreased rib mobility with mobilization and breathing    TODAY'S TREATMENT:                                                                                                                              DATE:  10/10/22 Neuromuscular re-education: Attended e-stim for bowel stimulation to help improve constipation/motility of bowels with education provided during on effects for 25 minutes  Settings 20hz  Therapeutic activities: HEP review   09/29/22 Manual: Abdominal soft tissue mobilization Bowel mobilization Neuromuscular re-education: Attended e-stim for stimulation of posterior tibial nerve to help improve constipation/motility of bowels with education during 20 minutes  09/21/22 Manual: Trigger Point Dry-Needling  Treatment instructions: Expect mild to moderate muscle soreness. S/S of pneumothorax if dry needled over a lung field, and to seek immediate medical attention should they occur. Patient verbalized  understanding of these instructions and education.  Patient Consent Given: No Education handout provided: Yes Muscles treated: Multifidi T12-L5, bil glutes/piriformis Electrical stimulation performed: No Parameters: N/A Treatment response/outcome: twitch response/release Soft tissue mobilization/Instrument assisted soft tissue mobilization to lumbar paraspinals  Abdominal soft tissue mobilization PATIENT EDUCATION:  Education details: See above Person educated: Patient Education method: Programmer, multimedia, Demonstration, Tactile cues, Verbal cues, and Handouts Education comprehension: verbalized understanding  HOME EXERCISE  PROGRAM: [id number]  ASSESSMENT:  CLINICAL IMPRESSION: Pt seemed to have good response to tibial nerve stimulation with increase in complete emptying with bowel movements over the last week. Today, we tried using TENs to abdominal muscles to see if this would not have even more of the desired effect. She reported comfort with with this technique. She will continue to benefit from skilled PT intervention in order to decrease pain, improve bowel movements, and decrease urinary urgency/incontinence.     OBJECTIVE IMPAIRMENTS: decreased activity tolerance, decreased coordination, decreased endurance, decreased mobility, decreased strength, increased fascial restrictions, increased muscle spasms, impaired tone, postural dysfunction, and pain.   ACTIVITY LIMITATIONS: lifting, bending, continence, and locomotion level  PARTICIPATION LIMITATIONS: interpersonal relationship, community activity, and occupation  PERSONAL FACTORS: 1 comorbidity: medical history  are also affecting patient's functional outcome.   REHAB POTENTIAL: Good  CLINICAL DECISION MAKING: Stable/uncomplicated  EVALUATION COMPLEXITY: Low   GOALS: Goals reviewed with patient? Yes  SHORT TERM GOALS: Target date: 07/06/22 - updated 07/12/22 - updated 08/18/22 - updated 09/22/22  Pt will be independent with HEP.   Baseline: Goal status: MET 07/12/22  2.  Pt will be independent with diaphragmatic breathing and down training activities in order to improve pelvic floor relaxation.  Baseline:  Goal status: MET 07/12/22  3.  Pt will be independent with the knack, urge suppression technique, and double voiding in order to improve bladder habits and decrease urinary incontinence.   Baseline:  Goal status: MET 09/22/22  4.  Pt will be independent with use of squatty potty, relaxed toileting mechanics, and improved bowel movement techniques in order to increase ease of bowel movements and complete evacuation.   Baseline:  Goal status:IN  PROGRESS  5.  Pt will be able to correctly perform diaphragmatic breathing and appropriate pressure management in order to prevent worsening vaginal wall laxity and improve pelvic floor A/ROM.   Baseline:  Goal status: IN PROGRESS  LONG TERM GOALS: Target date: 11/16/2022 - updated 07/12/22 - updated 08/18/22 - updated 09/22/22  Pt will be independent with advanced HEP.   Baseline:  Goal status: IN PROGRESS  2.  Pt will demonstrate normal pelvic floor muscle tone and A/ROM, able to achieve 4/5 strength with contractions and 10 sec endurance, in order to provide appropriate lumbopelvic support in functional activities.   Baseline:  Goal status: IN PROGRESS  3.  Pt will increase all impaired lumbar A/ROM by 25% without pain.  Baseline:  Goal status:  IN PROGRESS  4.  Pt will report pain no higher than 3/10 with any activity. Baseline:  Goal status:  IN PROGRESS  5.  Pt will report 0/10 pain with vaginal penetration in order to improve intimate relationship with partner.    Baseline:  Goal status:  IN PROGRESS  6.  Pt will be able to go 2-3 hours in between voids without urgency or incontinence in order to improve QOL and perform all functional activities with less difficulty.   Baseline:  Goal status: IN PROGRESS  7.  Pt will report no leaks with laughing, coughing,  sneezing in order to improve comfort with interpersonal relationships and community activities.   Baseline:  Goal status:  IN PROGRESS  8.  Pt will have consistent bowel movements 4-5x/week without straining or pain.  Baseline:  Goal status:  IN PROGRESS  PLAN:  PT FREQUENCY: 1-2x/week  PT DURATION: 6 months  PLANNED INTERVENTIONS: Therapeutic exercises, Therapeutic activity, Neuromuscular re-education, Balance training, Gait training, Patient/Family education, Self Care, Joint mobilization, Dry Needling, Biofeedback, and Manual therapy  PLAN FOR NEXT SESSION: Progress core and postural strengthening,  manual techniques as needed - 2 more visits.   Julio Alm, PT, DPT10/07/242:46 PM

## 2022-10-11 ENCOUNTER — Other Ambulatory Visit (HOSPITAL_COMMUNITY): Payer: Self-pay

## 2022-10-11 DIAGNOSIS — K5900 Constipation, unspecified: Secondary | ICD-10-CM | POA: Diagnosis not present

## 2022-10-11 DIAGNOSIS — M6289 Other specified disorders of muscle: Secondary | ICD-10-CM | POA: Diagnosis not present

## 2022-10-11 DIAGNOSIS — K602 Anal fissure, unspecified: Secondary | ICD-10-CM | POA: Diagnosis not present

## 2022-10-11 DIAGNOSIS — K589 Irritable bowel syndrome without diarrhea: Secondary | ICD-10-CM | POA: Diagnosis not present

## 2022-10-11 DIAGNOSIS — K581 Irritable bowel syndrome with constipation: Secondary | ICD-10-CM | POA: Diagnosis not present

## 2022-10-11 MED ORDER — PRUCALOPRIDE SUCCINATE 2 MG PO TABS
2.0000 mg | ORAL_TABLET | Freq: Every day | ORAL | 1 refills | Status: DC
  Filled 2022-10-11: qty 90, 90d supply, fill #0

## 2022-10-12 ENCOUNTER — Other Ambulatory Visit (HOSPITAL_COMMUNITY): Payer: Self-pay

## 2022-10-12 MED ORDER — XIIDRA 5 % OP SOLN
1.0000 [drp] | Freq: Two times a day (BID) | OPHTHALMIC | 3 refills | Status: DC
  Filled 2022-10-12: qty 180, 90d supply, fill #0
  Filled 2023-02-16 (×2): qty 180, 90d supply, fill #1
  Filled 2023-06-30: qty 180, 90d supply, fill #2
  Filled 2023-09-25: qty 180, 90d supply, fill #3

## 2022-10-13 ENCOUNTER — Other Ambulatory Visit (HOSPITAL_COMMUNITY): Payer: Self-pay

## 2022-10-17 ENCOUNTER — Other Ambulatory Visit (HOSPITAL_COMMUNITY): Payer: Self-pay

## 2022-10-17 MED ORDER — MOTEGRITY 1 MG PO TABS
1.0000 mg | ORAL_TABLET | Freq: Every day | ORAL | 1 refills | Status: DC
  Filled 2022-10-17: qty 90, 90d supply, fill #0

## 2022-10-18 ENCOUNTER — Other Ambulatory Visit (HOSPITAL_COMMUNITY): Payer: Self-pay

## 2022-10-19 ENCOUNTER — Other Ambulatory Visit (HOSPITAL_COMMUNITY): Payer: Self-pay

## 2022-10-19 ENCOUNTER — Ambulatory Visit (HOSPITAL_BASED_OUTPATIENT_CLINIC_OR_DEPARTMENT_OTHER): Payer: Commercial Managed Care - PPO | Admitting: Obstetrics & Gynecology

## 2022-10-20 ENCOUNTER — Other Ambulatory Visit (HOSPITAL_COMMUNITY): Payer: Self-pay

## 2022-10-20 ENCOUNTER — Ambulatory Visit: Payer: Commercial Managed Care - PPO

## 2022-10-20 DIAGNOSIS — M6281 Muscle weakness (generalized): Secondary | ICD-10-CM

## 2022-10-20 DIAGNOSIS — R102 Pelvic and perineal pain: Secondary | ICD-10-CM | POA: Diagnosis not present

## 2022-10-20 DIAGNOSIS — R279 Unspecified lack of coordination: Secondary | ICD-10-CM

## 2022-10-20 DIAGNOSIS — M5459 Other low back pain: Secondary | ICD-10-CM

## 2022-10-20 DIAGNOSIS — M62838 Other muscle spasm: Secondary | ICD-10-CM | POA: Diagnosis not present

## 2022-10-20 NOTE — Therapy (Signed)
OUTPATIENT PHYSICAL THERAPY FEMALE PELVIC TREATMENT   Patient Name: Karen [name] MRN: [id number] DOB:22-Mar-1970, 52 y.o., female Today's Date: 10/20/2022  END OF SESSION:  PT End of Session - 10/20/22 1027     Visit Number 17    Date for PT Re-Evaluation 11/16/22    Authorization Type [employer]    PT Start Time 1025    PT Stop Time 1105    PT Time Calculation (min) 40 min    Activity Tolerance Patient tolerated treatment well    Behavior During Therapy [facility] for tasks assessed/performed                  Past Medical History:  Diagnosis Date   Adenomyosis    Anemia    Arthritis 2013   L knee gets steroid injections    Asthmatic bronchitis 01/31/2017   Back pain    Chronic constipation    Diarrhea    DVT of lower extremity (deep venous thrombosis) (HCC)    Dysmenorrhea    Endometriosis    Esophageal stricture    GERD (gastroesophageal reflux disease)    History of kidney stones    History of uterine fibroid    IBS (irritable bowel syndrome)    Interstitial cystitis    Lactose intolerance    Migraine    with aura   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   PONV (postoperative nausea and vomiting)    Recurrent upper respiratory infection (URI)    Sebaceous cyst of breast, right lower inner quadrant 10/25/2012   Excised 11/21/12    Sleep apnea    Syncope, non cardiac    Past Surgical History:  Procedure Laterality Date   ARTHROSCOPIC HAGLUNDS REPAIR     BREAST CYST EXCISION Right 11/21/2012   Procedure: CYST EXCISION BREAST;  Surgeon: Currie Paris, MD;  Location: [facility];  Service: General;  Laterality: Right;   BREAST CYST EXCISION Bilateral 01/18/2022   Procedure: EXCISION OF SEBACEOUS CYST BILATERAL BREAST;  Surgeon: Harriette Bouillon, MD;  Location: [facility];  Service: General;  Laterality: Bilateral;   BREAST EXCISIONAL BIOPSY Right 11/2012   CESAREAN SECTION  04/19/1993   CHOLECYSTECTOMY     COLONOSCOPY   05/02/2011   Procedure: COLONOSCOPY;  Surgeon: Vertell Novak., MD;  Location: [facility];  Service: Endoscopy;  Laterality: N/A;   colonoscopy  11/04/2014   DENTAL SURGERY  03/06/2018   2 surgeries ( 03/06/2018 and 04/06/2018)   to remove two separate benign tumors   ESOPHAGEAL MANOMETRY N/A 11/04/2015   Procedure: ESOPHAGEAL MANOMETRY (EM);  Surgeon: Carman Ching, MD;  Location: [facility];  Service: Endoscopy;  Laterality: N/A;   ESOPHAGOGASTRODUODENOSCOPY  05/02/2011   Procedure: ESOPHAGOGASTRODUODENOSCOPY (EGD);  Surgeon: Vertell Novak., MD;  Location: [facility];  Service: Endoscopy;  Laterality: N/A;   ESOPHAGOGASTRODUODENOSCOPY N/A 09/01/2014   Procedure: ESOPHAGOGASTRODUODENOSCOPY (EGD);  Surgeon: Carman Ching, MD;  Location: [facility];  Service: Endoscopy;  Laterality: N/A;   KNEE SURGERY     LAPAROSCOPY     x 4   LESION REMOVAL N/A 01/18/2022   Procedure: EXCISION OF SEBACEOUS CYSTS CHEST AND NECK;  Surgeon: Harriette Bouillon, MD;  Location:  S[facility]ERY [facility];  Service: General;  Laterality: N/A;   PH IMPEDANCE STUDY N/A 11/04/2015   Procedure: PH IMPEDANCE STUDY;  Surgeon: Carman Ching, MD;  Location: [facility];  Service: Endoscopy;  Laterality: N/A;   VAGINAL HYSTERECTOMY  2010   TVH--ovaries remain   Patient Active  Problem List   Diagnosis Date Noted   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   Elevated LFTs 04/04/2021   Colitis 04/04/2021   Bacterial overgrowth syndrome 05/21/2020   Obesity 05/21/2020   History of endometriosis 05/21/2020   Constipation due to outlet dysfunction 02/05/2020   Gastroesophageal reflux disease 02/05/2020   Obstructive sleep apnea treated with continuous positive airway pressure (CPAP) 06/16/2017   Perennial allergic rhinitis with a nonallergic component 01/31/2017   Asthmatic bronchitis 01/31/2017   GI symptoms 01/31/2017   Family history of colon cancer 01/27/2015   Chest pain 12/13/2012   Dyspnea 12/13/2012   DVT  of lower extremity (deep venous thrombosis) (HCC) 01/03/1990    PCP: Lorenda Ishihara, MD  REFERRING PROVIDER: Jerene Bears, MD   REFERRING DIAG: M62.89 (ICD-10-CM) - Pelvic floor dysfunction  THERAPY DIAG:  Pelvic pain  Other low back pain  Muscle weakness (generalized)  Other muscle spasm  Unspecified lack of coordination  Rationale for Evaluation and Treatment: Rehabilitation  ONSET DATE: chronic  SUBJECTIVE:                                                                                                                                                                                           SUBJECTIVE STATEMENT: She has been to see GI doctor. She has started new medication to help with motility, but she was on the wrong dose and it has caused diarrhea. She also has another very large stool burden.    PAIN:  Are you having pain? Yes NPRS scale: 2/10 Pain location:  Lower abdominal pain, Lt sided pain, low back pain  Pain type: turning, knotted, pressure Pain description: intermittent and constant   Aggravating factors: constipation or frequent bowel movements/constantly having bowel movements  Relieving factors: exercises (bowel massage, happy baby, spinal twists, walking)  PRECAUTIONS: None  WEIGHT BEARING RESTRICTIONS: No  FALLS:  Has patient fallen in last 6 months? No  LIVING ENVIRONMENT: Lives with: lives with their spouse Lives in: House/apartment  OCCUPATION: nurse, in admin  PLOF: Independent  PATIENT GOALS: decrease pain and have more normal bowel movements  PERTINENT HISTORY:  Vaginal hysterectomy, DVT LE, endometriosis, interstitial cystitis, exploratory laps for endometriosis, c-section, cholecystectomy Sexual abuse: No  BOWEL MOVEMENT: Pain with bowel movement: Yes Type of bowel movement:Type (Bristol Stool Scale) 1-7 (sometimes full range in the same bowel movement), Frequency sometimes many times a day, sometimes up to 4 days in  between, and Strain Yes Fully empty rectum: No Leakage: No Pads: No Fiber supplement: No - has attempted metamucil and it made constipation worse  URINATION: Pain with urination: Yes Fully empty bladder: No Stream:  varies  Urgency: Yes: all the time Frequency: multiple times an hours  Leakage: Urge to void, Walking to the bathroom, Coughing, Sneezing, Laughing, and Exercise Pads: Yes: daily  INTERCOURSE: Pain with intercourse: Initial Penetration and During Penetration Ability to have vaginal penetration:  Yes: with pain Climax: non painful WNL   PREGNANCY: Vaginal deliveries 1 Tearing Yes: 3rd degree tear C-section deliveries 1 Currently pregnant No  PROLAPSE: Periodically will feel vaginal bulging, heaviness in lower abdomen   OBJECTIVE:  07/21/22: standing prolapse assessment demonstrated grade 2 anterior vaginal wall laxity   06/08/22:               External Perineal Exam: WNL                              Internal Pelvic Floor: burning reported with palpation of superficial muscles; deep aching/pressure with palpation of Lt levator ani   Patient confirms identification and approves PT to assess internal pelvic floor and treatment Yes  PELVIC MMT:   MMT eval  Vaginal 3/5  Internal Anal Sphincter 1/5  External Anal Sphincter 2/5  Puborectalis 1/5  Diastasis Recti   (Blank rows = not tested)        TONE: High in Lt levator ani   PROLAPSE: Not able to tell this session due to dyssynergic pelvic floor contraction     06/01/22: COGNITION: Overall cognitive status: Within functional limits for tasks assessed     SENSATION: Light touch: Appears intact Proprioception: Appears intact  GAIT: Comments: decreased hip extension, forward flexed trunk  POSTURE: rounded shoulders, forward head, decreased lumbar lordosis, increased thoracic kyphosis, posterior pelvic tilt, and flexed trunk    LUMBARAROM/PROM:  A/PROM A/PROM  Eval (% available)  Flexion 50   Extension 25, pain anteriorly   Right lateral flexion 50, pain anteriorly   Left lateral flexion 50, pain anteriorly    (Blank rows = not tested)   PALPATION:   General  Significant abdominal restriction and tenderness; decreased rib mobility with mobilization and breathing    TODAY'S TREATMENT:                                                                                                                              DATE:  10/20/22 Manual: Abdominal soft tissue mobilization Bowel mobilization Neuromuscular re-education: Attended e-stim for stimulation of bil posterior tibial nerve to help improve constipation/motility of bowels with education during 20 minutes at 20Hz  Therapeutic activities: Benefit of rectal pelvic floor muscle bulge training to correct paradoxical contraction Exhale with pushing during bowel movements  Working on pelvic floor relaxation/release   10/10/22 Neuromuscular re-education: Attended e-stim for bowel stimulation to help improve constipation/motility of bowels with education provided during on effects for 25 minutes  Settings 20hz  Therapeutic activities: HEP review   09/29/22 Manual: Abdominal soft tissue mobilization Bowel mobilization Neuromuscular re-education: Attended e-stim for stimulation of posterior tibial nerve to  help improve constipation/motility of bowels with education during 20 minutes  PATIENT EDUCATION:  Education details: See above Person educated: Patient Education method: Explanation, Demonstration, Tactile cues, Verbal cues, and Handouts Education comprehension: verbalized understanding  HOME EXERCISE PROGRAM: [email or web address]  ASSESSMENT:  CLINICAL IMPRESSION: Pt is overall about the same. She had new large stool burden confirmed on xray at last weekends medical appointment. We performed TENs to posterior tibial nerve to help continue to stimulate bowel movements/increased gut motility. Bowel mobilization performed with  notable restriction in superior midline abdomen with tenderness reported; some improvements and we are hopeful that this will help increase gut motility and decrease stool burden. We discussed at length benefit of continuing internal rectal pelvic floor release and contraction/push training in order to correct paradoxical pelvic floor contraction. She will continue to benefit from skilled PT intervention in order to decrease pain, improve bowel movements, and decrease urinary urgency/incontinence.     OBJECTIVE IMPAIRMENTS: decreased activity tolerance, decreased coordination, decreased endurance, decreased mobility, decreased strength, increased fascial restrictions, increased muscle spasms, impaired tone, postural dysfunction, and pain.   ACTIVITY LIMITATIONS: lifting, bending, continence, and locomotion level  PARTICIPATION LIMITATIONS: interpersonal relationship, community activity, and occupation  PERSONAL FACTORS: 1 comorbidity: medical history  are also affecting patient's functional outcome.   REHAB POTENTIAL: Good  CLINICAL DECISION MAKING: Stable/uncomplicated  EVALUATION COMPLEXITY: Low   GOALS: Goals reviewed with patient? Yes  SHORT TERM GOALS: Target date: 07/06/22 - updated 07/12/22 - updated 08/18/22 - updated 09/22/22  Pt will be independent with HEP.   Baseline: Goal status: MET 07/12/22  2.  Pt will be independent with diaphragmatic breathing and down training activities in order to improve pelvic floor relaxation.  Baseline:  Goal status: MET 07/12/22  3.  Pt will be independent with the knack, urge suppression technique, and double voiding in order to improve bladder habits and decrease urinary incontinence.   Baseline:  Goal status: MET 09/22/22  4.  Pt will be independent with use of squatty potty, relaxed toileting mechanics, and improved bowel movement techniques in order to increase ease of bowel movements and complete evacuation.   Baseline:  Goal status:IN  PROGRESS  5.  Pt will be able to correctly perform diaphragmatic breathing and appropriate pressure management in order to prevent worsening vaginal wall laxity and improve pelvic floor A/ROM.   Baseline:  Goal status: IN PROGRESS  LONG TERM GOALS: Target date: 11/16/2022 - updated 07/12/22 - updated 08/18/22 - updated 09/22/22  Pt will be independent with advanced HEP.   Baseline:  Goal status: IN PROGRESS  2.  Pt will demonstrate normal pelvic floor muscle tone and A/ROM, able to achieve 4/5 strength with contractions and 10 sec endurance, in order to provide appropriate lumbopelvic support in functional activities.   Baseline:  Goal status: IN PROGRESS  3.  Pt will increase all impaired lumbar A/ROM by 25% without pain.  Baseline:  Goal status:  IN PROGRESS  4.  Pt will report pain no higher than 3/10 with any activity. Baseline:  Goal status:  IN PROGRESS  5.  Pt will report 0/10 pain with vaginal penetration in order to improve intimate relationship with partner.    Baseline:  Goal status:  IN PROGRESS  6.  Pt will be able to go 2-3 hours in between voids without urgency or incontinence in order to improve QOL and perform all functional activities with less difficulty.   Baseline:  Goal status: IN PROGRESS  7.  Pt will report no  leaks with laughing, coughing, sneezing in order to improve comfort with interpersonal relationships and community activities.   Baseline:  Goal status:  IN PROGRESS  8.  Pt will have consistent bowel movements 4-5x/week without straining or pain.  Baseline:  Goal status:  IN PROGRESS  PLAN:  PT FREQUENCY: 1-2x/week  PT DURATION: 6 months  PLANNED INTERVENTIONS: Therapeutic exercises, Therapeutic activity, Neuromuscular re-education, Balance training, Gait training, Patient/Family education, Self Care, Joint mobilization, Dry Needling, Biofeedback, and Manual therapy  PLAN FOR NEXT SESSION: Progress core and postural strengthening,  manual techniques as needed - 2 more visits. Plan to perform some internal rectal release.  Julio Alm, PT, DPT10/17/2411:17 AM

## 2022-10-25 DIAGNOSIS — J441 Chronic obstructive pulmonary disease with (acute) exacerbation: Secondary | ICD-10-CM | POA: Diagnosis not present

## 2022-10-25 DIAGNOSIS — Z13220 Encounter for screening for lipoid disorders: Secondary | ICD-10-CM | POA: Diagnosis not present

## 2022-10-25 DIAGNOSIS — K200 Eosinophilic esophagitis: Secondary | ICD-10-CM | POA: Diagnosis not present

## 2022-10-25 DIAGNOSIS — R202 Paresthesia of skin: Secondary | ICD-10-CM | POA: Diagnosis not present

## 2022-10-25 DIAGNOSIS — G4733 Obstructive sleep apnea (adult) (pediatric): Secondary | ICD-10-CM | POA: Diagnosis not present

## 2022-10-25 DIAGNOSIS — K2100 Gastro-esophageal reflux disease with esophagitis, without bleeding: Secondary | ICD-10-CM | POA: Diagnosis not present

## 2022-10-25 DIAGNOSIS — L280 Lichen simplex chronicus: Secondary | ICD-10-CM | POA: Diagnosis not present

## 2022-10-25 DIAGNOSIS — Z0000 Encounter for general adult medical examination without abnormal findings: Secondary | ICD-10-CM | POA: Diagnosis not present

## 2022-10-25 DIAGNOSIS — Z86718 Personal history of other venous thrombosis and embolism: Secondary | ICD-10-CM | POA: Diagnosis not present

## 2022-10-25 DIAGNOSIS — K5900 Constipation, unspecified: Secondary | ICD-10-CM | POA: Diagnosis not present

## 2022-10-26 ENCOUNTER — Ambulatory Visit: Payer: Commercial Managed Care - PPO

## 2022-10-26 DIAGNOSIS — M5459 Other low back pain: Secondary | ICD-10-CM

## 2022-10-26 DIAGNOSIS — M6281 Muscle weakness (generalized): Secondary | ICD-10-CM

## 2022-10-26 DIAGNOSIS — R279 Unspecified lack of coordination: Secondary | ICD-10-CM

## 2022-10-26 DIAGNOSIS — R102 Pelvic and perineal pain: Secondary | ICD-10-CM

## 2022-10-26 DIAGNOSIS — M62838 Other muscle spasm: Secondary | ICD-10-CM | POA: Diagnosis not present

## 2022-10-26 NOTE — Therapy (Signed)
OUTPATIENT PHYSICAL THERAPY FEMALE PELVIC TREATMENT   Patient Name: Karen [name] MRN: [id number] DOB:[birth year], 52 y.o., female Today's Date: 10/26/2022  END OF SESSION:  PT End of Session - 10/26/22 1027     Visit Number 18    Date for PT Re-Evaluation 11/16/22    Authorization Type [employer]    PT Start Time 1025    PT Stop Time 1055    PT Time Calculation (min) 30 min    Activity Tolerance Patient tolerated treatment well    Behavior During Therapy [facility] for tasks assessed/performed                   Past Medical History:  Diagnosis Date   Adenomyosis    Anemia    Arthritis 2013   L knee gets steroid injections    Asthmatic bronchitis 01/31/2017   Back pain    Chronic constipation    Diarrhea    DVT of lower extremity (deep venous thrombosis) (HCC)    Dysmenorrhea    Endometriosis    Esophageal stricture    GERD (gastroesophageal reflux disease)    History of kidney stones    History of uterine fibroid    IBS (irritable bowel syndrome)    Interstitial cystitis    Lactose intolerance    Migraine    with aura   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   PONV (postoperative nausea and vomiting)    Recurrent upper respiratory infection (URI)    Sebaceous cyst of breast, right lower inner quadrant 10/25/2012   Excised 11/21/12    Sleep apnea    Syncope, non cardiac    Past Surgical History:  Procedure Laterality Date   ARTHROSCOPIC HAGLUNDS REPAIR     BREAST CYST EXCISION Right 11/21/2012   Procedure: CYST EXCISION BREAST;  Surgeon: Currie Paris, MD;  Location: [facility];  Service: General;  Laterality: Right;   BREAST CYST EXCISION Bilateral 01/18/2022   Procedure: EXCISION OF SEBACEOUS CYST BILATERAL BREAST;  Surgeon: Harriette Bouillon, MD;  Location: [facility];  Service: General;  Laterality: Bilateral;   BREAST EXCISIONAL BIOPSY Right 11/2012   CESAREAN SECTION  04/19/1993   CHOLECYSTECTOMY      COLONOSCOPY  05/02/2011   Procedure: COLONOSCOPY;  Surgeon: Vertell Novak., MD;  Location: [facility];  Service: Endoscopy;  Laterality: N/A;   colonoscopy  11/04/2014   DENTAL SURGERY  03/06/2018   2 surgeries ( 03/06/2018 and 04/06/2018)   to remove two separate benign tumors   ESOPHAGEAL MANOMETRY N/A 11/04/2015   Procedure: ESOPHAGEAL MANOMETRY (EM);  Surgeon: Carman Ching, MD;  Location: [facility];  Service: Endoscopy;  Laterality: N/A;   ESOPHAGOGASTRODUODENOSCOPY  05/02/2011   Procedure: ESOPHAGOGASTRODUODENOSCOPY (EGD);  Surgeon: Vertell Novak., MD;  Location: [facility];  Service: Endoscopy;  Laterality: N/A;   ESOPHAGOGASTRODUODENOSCOPY N/A 09/01/2014   Procedure: ESOPHAGOGASTRODUODENOSCOPY (EGD);  Surgeon: Carman Ching, MD;  Location: [facility];  Service: Endoscopy;  Laterality: N/A;   KNEE SURGERY     LAPAROSCOPY     x 4   LESION REMOVAL N/A 01/18/2022   Procedure: EXCISION OF SEBACEOUS CYSTS CHEST AND NECK;  Surgeon: Harriette Bouillon, MD;  Location: [facility];  Service: General;  Laterality: N/A;   PH IMPEDANCE STUDY N/A 11/04/2015   Procedure: PH IMPEDANCE STUDY;  Surgeon: Carman Ching, MD;  Location: [facility];  Service: Endoscopy;  Laterality: N/A;   VAGINAL HYSTERECTOMY  2010   TVH--ovaries remain   Patient  Active Problem List   Diagnosis Date Noted   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   Elevated LFTs 04/04/2021   Colitis 04/04/2021   Bacterial overgrowth syndrome 05/21/2020   Obesity 05/21/2020   History of endometriosis 05/21/2020   Constipation due to outlet dysfunction 02/05/2020   Gastroesophageal reflux disease 02/05/2020   Obstructive sleep apnea treated with continuous positive airway pressure (CPAP) 06/16/2017   Perennial allergic rhinitis with a nonallergic component 01/31/2017   Asthmatic bronchitis 01/31/2017   GI symptoms 01/31/2017   Family history of colon cancer 01/27/2015   Chest pain 12/13/2012   Dyspnea  12/13/2012   DVT of lower extremity (deep venous thrombosis) (HCC) 01/03/1990    PCP: Lorenda Ishihara, MD  REFERRING PROVIDER: Jerene Bears, MD   REFERRING DIAG: M62.89 (ICD-10-CM) - Pelvic floor dysfunction  THERAPY DIAG:  Pelvic pain  Other low back pain  Muscle weakness (generalized)  Other muscle spasm  Unspecified lack of coordination  Rationale for Evaluation and Treatment: Rehabilitation  ONSET DATE: chronic  SUBJECTIVE:                                                                                                                                                                                           SUBJECTIVE STATEMENT: Pt has been performing cleanse and is still having diarrhea. She would like to hold off on internal pelvic floor muscle release due to this.   PAIN:  Are you having pain? Yes NPRS scale: 3/10 Pain location:  Lower abdominal pain, Lt sided pain, low back pain  Pain type: turning, knotted, pressure Pain description: intermittent and constant   Aggravating factors: constipation or frequent bowel movements/constantly having bowel movements  Relieving factors: exercises (bowel massage, happy baby, spinal twists, walking)  PRECAUTIONS: None  WEIGHT BEARING RESTRICTIONS: No  FALLS:  Has patient fallen in last 6 months? No  LIVING ENVIRONMENT: Lives with: lives with their spouse Lives in: House/apartment  OCCUPATION: nurse, in admin  PLOF: Independent  PATIENT GOALS: decrease pain and have more normal bowel movements  PERTINENT HISTORY:  Vaginal hysterectomy, DVT LE, endometriosis, interstitial cystitis, exploratory laps for endometriosis, c-section, cholecystectomy Sexual abuse: No  BOWEL MOVEMENT: Pain with bowel movement: Yes Type of bowel movement:Type (Bristol Stool Scale) 1-7 (sometimes full range in the same bowel movement), Frequency sometimes many times a day, sometimes up to 4 days in between, and Strain Yes Fully  empty rectum: No Leakage: No Pads: No Fiber supplement: No - has attempted metamucil and it made constipation worse  URINATION: Pain with urination: Yes Fully empty bladder: No Stream:  varies  Urgency: Yes: all the time Frequency: multiple times an  hours  Leakage: Urge to void, Walking to the bathroom, Coughing, Sneezing, Laughing, and Exercise Pads: Yes: daily  INTERCOURSE: Pain with intercourse: Initial Penetration and During Penetration Ability to have vaginal penetration:  Yes: with pain Climax: non painful WNL   PREGNANCY: Vaginal deliveries 1 Tearing Yes: 3rd degree tear C-section deliveries 1 Currently pregnant No  PROLAPSE: Periodically will feel vaginal bulging, heaviness in lower abdomen   OBJECTIVE:  07/21/22: standing prolapse assessment demonstrated grade 2 anterior vaginal wall laxity   06/08/22:               External Perineal Exam: WNL                              Internal Pelvic Floor: burning reported with palpation of superficial muscles; deep aching/pressure with palpation of Lt levator ani   Patient confirms identification and approves PT to assess internal pelvic floor and treatment Yes  PELVIC MMT:   MMT eval  Vaginal 3/5  Internal Anal Sphincter 1/5  External Anal Sphincter 2/5  Puborectalis 1/5  Diastasis Recti   (Blank rows = not tested)        TONE: High in Lt levator ani   PROLAPSE: Not able to tell this session due to dyssynergic pelvic floor contraction     06/01/22: COGNITION: Overall cognitive status: Within functional limits for tasks assessed     SENSATION: Light touch: Appears intact Proprioception: Appears intact  GAIT: Comments: decreased hip extension, forward flexed trunk  POSTURE: rounded shoulders, forward head, decreased lumbar lordosis, increased thoracic kyphosis, posterior pelvic tilt, and flexed trunk    LUMBARAROM/PROM:  A/PROM A/PROM  Eval (% available)  Flexion 50  Extension 25, pain anteriorly    Right lateral flexion 50, pain anteriorly   Left lateral flexion 50, pain anteriorly    (Blank rows = not tested)   PALPATION:   General  Significant abdominal restriction and tenderness; decreased rib mobility with mobilization and breathing    TODAY'S TREATMENT:                                                                                                                              DATE:  10/26/22 Neuromuscular re-education: Down training: Butterfly Child's pose Cat cow Happy baby modified Modified spinal twist bil Diaphragmatic breathing training with hands on chest and belly; multimodal cues to help decrease apical/chest breathing pattern and focus on lower abdominal expansion   10/20/22 Manual: Abdominal soft tissue mobilization Bowel mobilization Neuromuscular re-education: Attended e-stim for stimulation of bil posterior tibial nerve to help improve constipation/motility of bowels with education during 20 minutes at 20Hz  Therapeutic activities: Benefit of rectal pelvic floor muscle bulge training to correct paradoxical contraction Exhale with pushing during bowel movements  Working on pelvic floor relaxation/release   10/10/22 Neuromuscular re-education: Attended e-stim for bowel stimulation to help improve constipation/motility of bowels with education provided during on effects  for 25 minutes  Settings 20hz  Therapeutic activities: HEP review   PATIENT EDUCATION:  Education details: See above Person educated: Patient Education method: Explanation, Demonstration, Tactile cues, Verbal cues, and Handouts Education comprehension: verbalized understanding  HOME EXERCISE PROGRAM: [id number]  ASSESSMENT:  CLINICAL IMPRESSION: Due to diarrhea and having had cleanse over the last week, pt deferred internal pelvic floor muscle release today. She was able to focus on down training activities and diaphragmatic breathing to help improve pelvic floor lengthening  externally. She had difficulty reverting back to apical breathing pattern, but use of her own hands for feedback was very helpful. In general good tolerance to all down training activities and she was encouraged to keep working on these to make sure that pelvic floor tension is not adding to difficulty with bowel movements. HEP updated with relaxation activities. She will continue to benefit from skilled PT intervention in order to decrease pain, improve bowel movements, and decrease urinary urgency/incontinence.     OBJECTIVE IMPAIRMENTS: decreased activity tolerance, decreased coordination, decreased endurance, decreased mobility, decreased strength, increased fascial restrictions, increased muscle spasms, impaired tone, postural dysfunction, and pain.   ACTIVITY LIMITATIONS: lifting, bending, continence, and locomotion level  PARTICIPATION LIMITATIONS: interpersonal relationship, community activity, and occupation  PERSONAL FACTORS: 1 comorbidity: medical history  are also affecting patient's functional outcome.   REHAB POTENTIAL: Good  CLINICAL DECISION MAKING: Stable/uncomplicated  EVALUATION COMPLEXITY: Low   GOALS: Goals reviewed with patient? Yes  SHORT TERM GOALS: Target date: 07/06/22 - updated 07/12/22 - updated 08/18/22 - updated 09/22/22 - updated 10/26/22  Pt will be independent with HEP.   Baseline: Goal status: MET 07/12/22  2.  Pt will be independent with diaphragmatic breathing and down training activities in order to improve pelvic floor relaxation.  Baseline:  Goal status: MET 07/12/22  3.  Pt will be independent with the knack, urge suppression technique, and double voiding in order to improve bladder habits and decrease urinary incontinence.   Baseline:  Goal status: MET 09/22/22  4.  Pt will be independent with use of squatty potty, relaxed toileting mechanics, and improved bowel movement techniques in order to increase ease of bowel movements and complete evacuation.    Baseline:  Goal status:IN PROGRESS  5.  Pt will be able to correctly perform diaphragmatic breathing and appropriate pressure management in order to prevent worsening vaginal wall laxity and improve pelvic floor A/ROM.   Baseline:  Goal status: IN PROGRESS  LONG TERM GOALS: Target date: 11/16/2022 - updated 07/12/22 - updated 08/18/22 - updated 09/22/22 - updated 10/26/22  Pt will be independent with advanced HEP.   Baseline:  Goal status: IN PROGRESS  2.  Pt will demonstrate normal pelvic floor muscle tone and A/ROM, able to achieve 4/5 strength with contractions and 10 sec endurance, in order to provide appropriate lumbopelvic support in functional activities.   Baseline:  Goal status: IN PROGRESS  3.  Pt will increase all impaired lumbar A/ROM by 25% without pain.  Baseline:  Goal status:  IN PROGRESS  4.  Pt will report pain no higher than 3/10 with any activity. Baseline:  Goal status:  IN PROGRESS  5.  Pt will report 0/10 pain with vaginal penetration in order to improve intimate relationship with partner.    Baseline:  Goal status:  IN PROGRESS  6.  Pt will be able to go 2-3 hours in between voids without urgency or incontinence in order to improve QOL and perform all functional activities with less difficulty.  Baseline:  Goal status: IN PROGRESS  7.  Pt will report no leaks with laughing, coughing, sneezing in order to improve comfort with interpersonal relationships and community activities.   Baseline:  Goal status:  IN PROGRESS  8.  Pt will have consistent bowel movements 4-5x/week without straining or pain.  Baseline:  Goal status:  IN PROGRESS  PLAN:  PT FREQUENCY: 1-2x/week  PT DURATION: 6 months  PLANNED INTERVENTIONS: Therapeutic exercises, Therapeutic activity, Neuromuscular re-education, Balance training, Gait training, Patient/Family education, Self Care, Joint mobilization, Dry Needling, Biofeedback, and Manual therapy  PLAN FOR NEXT  SESSION: Progress core and postural strengthening, manual techniques as needed - 1 more visits. Plan to perform some internal rectal release. Progress down training.   Julio Alm, PT, DPT10/23/2410:56 AM

## 2022-10-28 ENCOUNTER — Other Ambulatory Visit (HOSPITAL_COMMUNITY): Payer: Self-pay

## 2022-10-31 ENCOUNTER — Other Ambulatory Visit (HOSPITAL_COMMUNITY): Payer: Self-pay

## 2022-10-31 DIAGNOSIS — L309 Dermatitis, unspecified: Secondary | ICD-10-CM | POA: Diagnosis not present

## 2022-10-31 DIAGNOSIS — L728 Other follicular cysts of the skin and subcutaneous tissue: Secondary | ICD-10-CM | POA: Diagnosis not present

## 2022-10-31 MED ORDER — TACROLIMUS 0.1 % EX OINT
TOPICAL_OINTMENT | CUTANEOUS | 3 refills | Status: DC
  Filled 2022-10-31: qty 30, 30d supply, fill #0

## 2022-11-01 ENCOUNTER — Ambulatory Visit: Payer: Commercial Managed Care - PPO

## 2022-11-01 DIAGNOSIS — M5459 Other low back pain: Secondary | ICD-10-CM

## 2022-11-01 DIAGNOSIS — M6281 Muscle weakness (generalized): Secondary | ICD-10-CM | POA: Diagnosis not present

## 2022-11-01 DIAGNOSIS — R102 Pelvic and perineal pain: Secondary | ICD-10-CM | POA: Diagnosis not present

## 2022-11-01 DIAGNOSIS — R279 Unspecified lack of coordination: Secondary | ICD-10-CM | POA: Diagnosis not present

## 2022-11-01 DIAGNOSIS — M62838 Other muscle spasm: Secondary | ICD-10-CM

## 2022-11-01 NOTE — Therapy (Signed)
OUTPATIENT PHYSICAL THERAPY FEMALE PELVIC TREATMENT   Patient Name: Karen [name] MRN: [id number] DOB:[birth year], 52 y.o., female Today's Date: 11/01/2022  END OF SESSION:  PT End of Session - 11/01/22 1449     Visit Number 19    Date for PT Re-Evaluation 11/16/22    Authorization Type [employer]    PT Start Time 1448    PT Stop Time 1526    PT Time Calculation (min) 38 min    Activity Tolerance Patient tolerated treatment well    Behavior During Therapy [facility] for tasks assessed/performed                   Past Medical History:  Diagnosis Date   Adenomyosis    Anemia    Arthritis 2013   L knee gets steroid injections    Asthmatic bronchitis 01/31/2017   Back pain    Chronic constipation    Diarrhea    DVT of lower extremity (deep venous thrombosis) (HCC)    Dysmenorrhea    Endometriosis    Esophageal stricture    GERD (gastroesophageal reflux disease)    History of kidney stones    History of uterine fibroid    IBS (irritable bowel syndrome)    Interstitial cystitis    Lactose intolerance    Migraine    with aura   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   PONV (postoperative nausea and vomiting)    Recurrent upper respiratory infection (URI)    Sebaceous cyst of breast, right lower inner quadrant 10/25/2012   Excised 11/21/12    Sleep apnea    Syncope, non cardiac    Past Surgical History:  Procedure Laterality Date   ARTHROSCOPIC HAGLUNDS REPAIR     BREAST CYST EXCISION Right 11/21/2012   Procedure: CYST EXCISION BREAST;  Surgeon: Currie Paris, MD;  Location: [facility];  Service: General;  Laterality: Right;   BREAST CYST EXCISION Bilateral 01/18/2022   Procedure: EXCISION OF SEBACEOUS CYST BILATERAL BREAST;  Surgeon: Harriette Bouillon, MD;  Location: [facility];  Service: General;  Laterality: Bilateral;   BREAST EXCISIONAL BIOPSY Right 11/2012   CESAREAN SECTION  04/19/1993   CHOLECYSTECTOMY      COLONOSCOPY  05/02/2011   Procedure: COLONOSCOPY;  Surgeon: Vertell Novak., MD;  Location: [facility];  Service: Endoscopy;  Laterality: N/A;   colonoscopy  11/04/2014   DENTAL SURGERY  03/06/2018   2 surgeries ( 03/06/2018 and 04/06/2018)   to remove two separate benign tumors   ESOPHAGEAL MANOMETRY N/A 11/04/2015   Procedure: ESOPHAGEAL MANOMETRY (EM);  Surgeon: Carman Ching, MD;  Location: [facility];  Service: Endoscopy;  Laterality: N/A;   ESOPHAGOGASTRODUODENOSCOPY  05/02/2011   Procedure: ESOPHAGOGASTRODUODENOSCOPY (EGD);  Surgeon: Vertell Novak., MD;  Location: [facility];  Service: Endoscopy;  Laterality: N/A;   ESOPHAGOGASTRODUODENOSCOPY N/A 09/01/2014   Procedure: ESOPHAGOGASTRODUODENOSCOPY (EGD);  Surgeon: Carman Ching, MD;  Location: [facility];  Service: Endoscopy;  Laterality: N/A;   KNEE SURGERY     LAPAROSCOPY     x 4   LESION REMOVAL N/A 01/18/2022   Procedure: EXCISION OF SEBACEOUS CYSTS CHEST AND NECK;  Surgeon: Harriette Bouillon, MD;  Location: [facility];  Service: General;  Laterality: N/A;   PH IMPEDANCE STUDY N/A 11/04/2015   Procedure: PH IMPEDANCE STUDY;  Surgeon: Carman Ching, MD;  Location: [facility];  Service: Endoscopy;  Laterality: N/A;   VAGINAL HYSTERECTOMY  2010   TVH--ovaries remain   Patient  Active Problem List   Diagnosis Date Noted   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   Elevated LFTs 04/04/2021   Colitis 04/04/2021   Bacterial overgrowth syndrome 05/21/2020   Obesity 05/21/2020   History of endometriosis 05/21/2020   Constipation due to outlet dysfunction 02/05/2020   Gastroesophageal reflux disease 02/05/2020   Obstructive sleep apnea treated with continuous positive airway pressure (CPAP) 06/16/2017   Perennial allergic rhinitis with a nonallergic component 01/31/2017   Asthmatic bronchitis 01/31/2017   GI symptoms 01/31/2017   Family history of colon cancer 01/27/2015   Chest pain 12/13/2012   Dyspnea  12/13/2012   DVT of lower extremity (deep venous thrombosis) (HCC) 01/03/1990    PCP: Lorenda Ishihara, MD  REFERRING PROVIDER: Jerene Bears, MD   REFERRING DIAG: M62.89 (ICD-10-CM) - Pelvic floor dysfunction  THERAPY DIAG:  Pelvic pain  Other low back pain  Muscle weakness (generalized)  Other muscle spasm  Unspecified lack of coordination  Rationale for Evaluation and Treatment: Rehabilitation  ONSET DATE: chronic  SUBJECTIVE:                                                                                                                                                                                           SUBJECTIVE STATEMENT: Pt states that she is still just doing Miralax. She is starting to feel a little heavy again in her abdomen. Diarrhea is slowing down. There was one day when she only had 2 bowel movements.   PAIN:  Are you having pain? Yes NPRS scale: 3/10 Pain location:  Lower abdominal pain, Lt sided pain, low back pain  Pain type: turning, knotted, pressure Pain description: intermittent and constant   Aggravating factors: constipation or frequent bowel movements/constantly having bowel movements  Relieving factors: exercises (bowel massage, happy baby, spinal twists, walking)  PRECAUTIONS: None  WEIGHT BEARING RESTRICTIONS: No  FALLS:  Has patient fallen in last 6 months? No  LIVING ENVIRONMENT: Lives with: lives with their spouse Lives in: House/apartment  OCCUPATION: nurse, in admin  PLOF: Independent  PATIENT GOALS: decrease pain and have more normal bowel movements  PERTINENT HISTORY:  Vaginal hysterectomy, DVT LE, endometriosis, interstitial cystitis, exploratory laps for endometriosis, c-section, cholecystectomy Sexual abuse: No  BOWEL MOVEMENT: Pain with bowel movement: Yes Type of bowel movement:Type (Bristol Stool Scale) 1-7 (sometimes full range in the same bowel movement), Frequency sometimes many times a day,  sometimes up to 4 days in between, and Strain Yes Fully empty rectum: No Leakage: No Pads: No Fiber supplement: No - has attempted metamucil and it made constipation worse  URINATION: Pain with urination: Yes Fully empty bladder: No Stream:  varies  Urgency: Yes: all the time Frequency: multiple times an hours  Leakage: Urge to void, Walking to the bathroom, Coughing, Sneezing, Laughing, and Exercise Pads: Yes: daily  INTERCOURSE: Pain with intercourse: Initial Penetration and During Penetration Ability to have vaginal penetration:  Yes: with pain Climax: non painful WNL   PREGNANCY: Vaginal deliveries 1 Tearing Yes: 3rd degree tear C-section deliveries 1 Currently pregnant No  PROLAPSE: Periodically will feel vaginal bulging, heaviness in lower abdomen   OBJECTIVE:  07/21/22: standing prolapse assessment demonstrated grade 2 anterior vaginal wall laxity   06/08/22:               External Perineal Exam: WNL                              Internal Pelvic Floor: burning reported with palpation of superficial muscles; deep aching/pressure with palpation of Lt levator ani   Patient confirms identification and approves PT to assess internal pelvic floor and treatment Yes  PELVIC MMT:   MMT eval  Vaginal 3/5  Internal Anal Sphincter 1/5  External Anal Sphincter 2/5  Puborectalis 1/5  Diastasis Recti   (Blank rows = not tested)        TONE: High in Lt levator ani   PROLAPSE: Not able to tell this session due to dyssynergic pelvic floor contraction     06/01/22: COGNITION: Overall cognitive status: Within functional limits for tasks assessed     SENSATION: Light touch: Appears intact Proprioception: Appears intact  GAIT: Comments: decreased hip extension, forward flexed trunk  POSTURE: rounded shoulders, forward head, decreased lumbar lordosis, increased thoracic kyphosis, posterior pelvic tilt, and flexed trunk    LUMBARAROM/PROM:  A/PROM A/PROM  Eval (%  available)  Flexion 50  Extension 25, pain anteriorly   Right lateral flexion 50, pain anteriorly   Left lateral flexion 50, pain anteriorly    (Blank rows = not tested)   PALPATION:   General  Significant abdominal restriction and tenderness; decreased rib mobility with mobilization and breathing    TODAY'S TREATMENT:                                                                                                                              DATE:  11/01/22 Manual: Bowel mobilization  Neuromuscular re-education: Swiss ball activities: Circles 20x bil Lateral glides 20x bil Pelvic tilts 2 x 10 Pelvic floor bulge 10x Pelvic floor muscle contraction training with external feedback at perineum in supine  Push training with appropriate breathing and external tactile cues at coccyx in sidelying  Modified cat cow on table 10x   10/26/22 Neuromuscular re-education: Down training: Butterfly Child's pose Cat cow Happy baby modified Modified spinal twist bil Diaphragmatic breathing training with hands on chest and belly; multimodal cues to help decrease apical/chest breathing pattern and focus on lower abdominal expansion   10/20/22 Manual: Abdominal soft tissue mobilization Bowel mobilization  Neuromuscular re-education: Attended e-stim for stimulation of bil posterior tibial nerve to help improve constipation/motility of bowels with education during 20 minutes at 20Hz  Therapeutic activities: Benefit of rectal pelvic floor muscle bulge training to correct paradoxical contraction Exhale with pushing during bowel movements  Working on pelvic floor relaxation/release     PATIENT EDUCATION:  Education details: See above Person educated: Patient Education method: Programmer, multimedia, Facilities manager, Actor cues, Verbal cues, and Handouts Education comprehension: verbalized understanding  HOME EXERCISE PROGRAM: [id number]  ASSESSMENT:  CLINICAL IMPRESSION: Discussed plan of  care with patient and if she wanted to discharge or continue PT and re-evaluate next session. She feels like there is still benefit to be had from sessions. Agree that we have not been able to really address pelvic floor dysfunction due to perineal irritation and switching between constipation and obstruction. Today we palpated externally to see how pelvic floor contraction was doing and she have some motor control, but diffiuclty with multiple contractions and no endurance. She was also having trouble with lengthening/bulging pelvic floor. She made good improvements with multimodal cues. We also practiced pushing with exhale/blowing up a balloon with tactile feedback at coccyx. This was very helpful to improve lengthening with exhale/push and most beneficial with opening her mouth to exhale. Pt demonstrated notable difficulty in pelvic motor control performing mobility activities on swiss ball but was able to gain more control over movement and practice pelvic floor bulge here. She will continue to benefit from skilled PT intervention in order to decrease pain, improve bowel movements, and decrease urinary urgency/incontinence.     OBJECTIVE IMPAIRMENTS: decreased activity tolerance, decreased coordination, decreased endurance, decreased mobility, decreased strength, increased fascial restrictions, increased muscle spasms, impaired tone, postural dysfunction, and pain.   ACTIVITY LIMITATIONS: lifting, bending, continence, and locomotion level  PARTICIPATION LIMITATIONS: interpersonal relationship, community activity, and occupation  PERSONAL FACTORS: 1 comorbidity: medical history  are also affecting patient's functional outcome.   REHAB POTENTIAL: Good  CLINICAL DECISION MAKING: Stable/uncomplicated  EVALUATION COMPLEXITY: Low   GOALS: Goals reviewed with patient? Yes  SHORT TERM GOALS: Target date: 07/06/22 - updated 07/12/22 - updated 08/18/22 - updated 09/22/22 - updated 10/26/22  Pt will be  independent with HEP.   Baseline: Goal status: MET 07/12/22  2.  Pt will be independent with diaphragmatic breathing and down training activities in order to improve pelvic floor relaxation.  Baseline:  Goal status: MET 07/12/22  3.  Pt will be independent with the knack, urge suppression technique, and double voiding in order to improve bladder habits and decrease urinary incontinence.   Baseline:  Goal status: MET 09/22/22  4.  Pt will be independent with use of squatty potty, relaxed toileting mechanics, and improved bowel movement techniques in order to increase ease of bowel movements and complete evacuation.   Baseline:  Goal status:IN PROGRESS  5.  Pt will be able to correctly perform diaphragmatic breathing and appropriate pressure management in order to prevent worsening vaginal wall laxity and improve pelvic floor A/ROM.   Baseline:  Goal status: IN PROGRESS  LONG TERM GOALS: Target date: 11/16/2022 - updated 07/12/22 - updated 08/18/22 - updated 09/22/22 - updated 10/26/22  Pt will be independent with advanced HEP.   Baseline:  Goal status: IN PROGRESS  2.  Pt will demonstrate normal pelvic floor muscle tone and A/ROM, able to achieve 4/5 strength with contractions and 10 sec endurance, in order to provide appropriate lumbopelvic support in functional activities.   Baseline:  Goal status: IN PROGRESS  3.  Pt will increase all impaired lumbar A/ROM by 25% without pain.  Baseline:  Goal status:  IN PROGRESS  4.  Pt will report pain no higher than 3/10 with any activity. Baseline:  Goal status:  IN PROGRESS  5.  Pt will report 0/10 pain with vaginal penetration in order to improve intimate relationship with partner.    Baseline:  Goal status:  IN PROGRESS  6.  Pt will be able to go 2-3 hours in between voids without urgency or incontinence in order to improve QOL and perform all functional activities with less difficulty.   Baseline:  Goal status: IN  PROGRESS  7.  Pt will report no leaks with laughing, coughing, sneezing in order to improve comfort with interpersonal relationships and community activities.   Baseline:  Goal status:  IN PROGRESS  8.  Pt will have consistent bowel movements 4-5x/week without straining or pain.  Baseline:  Goal status:  IN PROGRESS  PLAN:  PT FREQUENCY: 1-2x/week  PT DURATION: 6 months  PLANNED INTERVENTIONS: Therapeutic exercises, Therapeutic activity, Neuromuscular re-education, Balance training, Gait training, Patient/Family education, Self Care, Joint mobilization, Dry Needling, Biofeedback, and Manual therapy  PLAN FOR NEXT SESSION: Re-evaluate.  Julio Alm, PT, DPT10/29/244:11 PM

## 2022-11-01 NOTE — Patient Instructions (Signed)
   The first picture shows that there is no effect on the pelvic floor with gravity eliminated. The next three show that with a wedge pillow or a few pillows from home under your pelvis the pelvic floor is inverted and may relax and allows gravity to help return prolapsed areas more inward to help relieve symptoms. Do this 15-20 mins every evening when symptoms tend to be worse. Stop if you have pain or negative symptoms.   [facility] [address] [place], [place] [address] Phone # [phone] Fax [phone]

## 2022-11-04 ENCOUNTER — Telehealth: Payer: Self-pay

## 2022-11-04 NOTE — Telephone Encounter (Signed)
Patient had called triage line and left a message stating that she was having increased tingling and numbness in her left leg.   I returned patient's call and verified with two identifiers spoke with patient. She stated that she has been wearing her stocking but continues to have swelling in her legs. She is on the wait list for lymphedema clinic as this is what was recommended by Dr. Myra Gianotti at her last visit. Patient also stated that she is having lower back pain along with constipation issues. She has been referred to neurology but has yet to follow through with this. I recommended that she do so as I told her that the symptoms she is having could be associated with the low back pain. She stated she would do this> Dr. Myra Gianotti also recommended that she see Nancy,RN for sclero therapy, she will reach out to her to schedule this in the coming weeks. Patient told to call the office if she needed anything further

## 2022-11-07 ENCOUNTER — Other Ambulatory Visit: Payer: Self-pay

## 2022-11-07 ENCOUNTER — Emergency Department (HOSPITAL_BASED_OUTPATIENT_CLINIC_OR_DEPARTMENT_OTHER)
Admission: EM | Admit: 2022-11-07 | Discharge: 2022-11-08 | Disposition: A | Discharge status: Home / Self Care | Payer: Commercial Managed Care - PPO | Attending: Emergency Medicine | Admitting: Emergency Medicine

## 2022-11-07 DIAGNOSIS — R079 Chest pain, unspecified: Secondary | ICD-10-CM

## 2022-11-07 DIAGNOSIS — R200 Anesthesia of skin: Secondary | ICD-10-CM | POA: Diagnosis not present

## 2022-11-07 DIAGNOSIS — R29818 Other symptoms and signs involving the nervous system: Secondary | ICD-10-CM | POA: Diagnosis not present

## 2022-11-07 DIAGNOSIS — K449 Diaphragmatic hernia without obstruction or gangrene: Secondary | ICD-10-CM | POA: Insufficient documentation

## 2022-11-07 DIAGNOSIS — R202 Paresthesia of skin: Secondary | ICD-10-CM | POA: Diagnosis not present

## 2022-11-07 DIAGNOSIS — J45909 Unspecified asthma, uncomplicated: Secondary | ICD-10-CM | POA: Diagnosis not present

## 2022-11-07 DIAGNOSIS — R1110 Vomiting, unspecified: Secondary | ICD-10-CM | POA: Diagnosis not present

## 2022-11-07 DIAGNOSIS — R0789 Other chest pain: Secondary | ICD-10-CM | POA: Diagnosis not present

## 2022-11-07 DIAGNOSIS — K5900 Constipation, unspecified: Secondary | ICD-10-CM | POA: Diagnosis not present

## 2022-11-07 DIAGNOSIS — R449 Unspecified symptoms and signs involving general sensations and perceptions: Secondary | ICD-10-CM | POA: Diagnosis not present

## 2022-11-07 DIAGNOSIS — R519 Headache, unspecified: Secondary | ICD-10-CM | POA: Diagnosis not present

## 2022-11-07 DIAGNOSIS — R197 Diarrhea, unspecified: Secondary | ICD-10-CM | POA: Diagnosis not present

## 2022-11-07 NOTE — ED Triage Notes (Signed)
Chest pain starting tonight around 2000. Right sided with SOB. Also reports developing right sided facial numbness around 2330. No other focal deficits. Extensive GI hx with IBS. Multiple episodes of V/D yesterday.

## 2022-11-08 ENCOUNTER — Emergency Department (HOSPITAL_COMMUNITY): Payer: Commercial Managed Care - PPO

## 2022-11-08 ENCOUNTER — Emergency Department (HOSPITAL_BASED_OUTPATIENT_CLINIC_OR_DEPARTMENT_OTHER): Payer: Commercial Managed Care - PPO

## 2022-11-08 DIAGNOSIS — R0602 Shortness of breath: Secondary | ICD-10-CM | POA: Diagnosis not present

## 2022-11-08 DIAGNOSIS — K5900 Constipation, unspecified: Secondary | ICD-10-CM | POA: Diagnosis not present

## 2022-11-08 DIAGNOSIS — R0789 Other chest pain: Secondary | ICD-10-CM | POA: Diagnosis not present

## 2022-11-08 DIAGNOSIS — R079 Chest pain, unspecified: Secondary | ICD-10-CM | POA: Diagnosis not present

## 2022-11-08 DIAGNOSIS — R449 Unspecified symptoms and signs involving general sensations and perceptions: Secondary | ICD-10-CM | POA: Diagnosis not present

## 2022-11-08 DIAGNOSIS — R29818 Other symptoms and signs involving the nervous system: Secondary | ICD-10-CM | POA: Diagnosis not present

## 2022-11-08 DIAGNOSIS — J45909 Unspecified asthma, uncomplicated: Secondary | ICD-10-CM | POA: Diagnosis not present

## 2022-11-08 DIAGNOSIS — R519 Headache, unspecified: Secondary | ICD-10-CM | POA: Diagnosis not present

## 2022-11-08 DIAGNOSIS — R200 Anesthesia of skin: Secondary | ICD-10-CM | POA: Diagnosis not present

## 2022-11-08 DIAGNOSIS — R197 Diarrhea, unspecified: Secondary | ICD-10-CM | POA: Diagnosis not present

## 2022-11-08 DIAGNOSIS — R1110 Vomiting, unspecified: Secondary | ICD-10-CM | POA: Diagnosis not present

## 2022-11-08 LAB — CBC
HCT: 38.9 % (ref 36.0–46.0)
Hemoglobin: 12.7 g/dL (ref 12.0–15.0)
MCH: 32.3 pg (ref 26.0–34.0)
MCHC: 32.6 g/dL (ref 30.0–36.0)
MCV: 99 fL (ref 80.0–100.0)
Platelets: 171 10*3/uL (ref 150–400)
RBC: 3.93 MIL/uL (ref 3.87–5.11)
RDW: 12.5 % (ref 11.5–15.5)
WBC: 6.4 10*3/uL (ref 4.0–10.5)
nRBC: 0 % (ref 0.0–0.2)

## 2022-11-08 LAB — COMPREHENSIVE METABOLIC PANEL
ALT: 16 U/L (ref 0–44)
AST: 27 U/L (ref 15–41)
Albumin: 4.2 g/dL (ref 3.5–5.0)
Alkaline Phosphatase: 103 U/L (ref 38–126)
Anion gap: 6 (ref 5–15)
BUN: 8 mg/dL (ref 6–20)
CO2: 30 mmol/L (ref 22–32)
Calcium: 9.9 mg/dL (ref 8.9–10.3)
Chloride: 102 mmol/L (ref 98–111)
Creatinine, Ser: 0.72 mg/dL (ref 0.44–1.00)
GFR, Estimated: >60 mL/min (ref >60)
Glucose, Bld: 97 mg/dL (ref 70–99)
Potassium: 4.7 mmol/L (ref 3.5–5.1)
Sodium: 138 mmol/L (ref 135–145)
Total Bilirubin: 0.8 mg/dL (ref <1.2)
Total Protein: 7.1 g/dL (ref 6.5–8.1)

## 2022-11-08 LAB — APTT: aPTT: 28 s (ref 24–36)

## 2022-11-08 LAB — PROTIME-INR
INR: 1 (ref 0.8–1.2)
Prothrombin Time: 13.2 s (ref 11.4–15.2)

## 2022-11-08 LAB — TROPONIN I (HIGH SENSITIVITY)
Troponin I (High Sensitivity): 3 ng/L (ref <18)
Troponin I (High Sensitivity): <2 ng/L (ref <18)

## 2022-11-08 LAB — ETHANOL: Alcohol, Ethyl (B): <10 mg/dL (ref <10)

## 2022-11-08 MED ORDER — IOHEXOL 350 MG/ML SOLN
75.0000 mL | Freq: Once | INTRAVENOUS | Status: AC | PRN
  Administered 2022-11-08: 75 mL via INTRAVENOUS

## 2022-11-08 MED ORDER — PROCHLORPERAZINE EDISYLATE 10 MG/2ML IJ SOLN
10.0000 mg | Freq: Once | INTRAMUSCULAR | Status: AC
  Administered 2022-11-08: 10 mg via INTRAVENOUS
  Filled 2022-11-08: qty 2

## 2022-11-08 MED ORDER — MIDAZOLAM HCL 2 MG/2ML IJ SOLN
1.0000 mg | Freq: Once | INTRAMUSCULAR | Status: AC
Start: 2022-11-08 — End: 2022-11-08
  Administered 2022-11-08: 1 mg via INTRAVENOUS
  Filled 2022-11-08: qty 2

## 2022-11-08 MED ORDER — DIPHENHYDRAMINE HCL 50 MG/ML IJ SOLN
25.0000 mg | Freq: Once | INTRAMUSCULAR | Status: AC
  Administered 2022-11-08: 25 mg via INTRAVENOUS
  Filled 2022-11-08: qty 1

## 2022-11-08 NOTE — ED Provider Notes (Signed)
[facility] Emergency Department Provider Note MRN:  [id number]  Arrival date & time: 11/08/22     Chief Complaint   Chest Pain and Numbness   History of Present Illness   Karen [name] is a 52 y.o. year-old female with a history of migraines, DVT presenting to the ED with chief complaint of chest pain.  Central chest pain with radiation to the right side of the neck, right jaw.  After the chest pain started she then noticed decreased sensation to the right side of the face.  Also has developed a headache.  Symptoms starting at about 11:30 PM.  Review of Systems  A thorough review of systems was obtained and all systems are negative except as noted in the HPI and PMH.   Patient's Health History    Past Medical History:  Diagnosis Date   Adenomyosis    Anemia    Arthritis 2013   L knee gets steroid injections    Asthmatic bronchitis 01/31/2017   Back pain    Chronic constipation    Diarrhea    DVT of lower extremity (deep venous thrombosis) (HCC)    Dysmenorrhea    Endometriosis    Esophageal stricture    GERD (gastroesophageal reflux disease)    History of kidney stones    History of uterine fibroid    IBS (irritable bowel syndrome)    Interstitial cystitis    Lactose intolerance    Migraine    with aura   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   PONV (postoperative nausea and vomiting)    Recurrent upper respiratory infection (URI)    Sebaceous cyst of breast, right lower inner quadrant 10/25/2012   Excised 11/21/12    Sleep apnea    Syncope, non cardiac     Past Surgical History:  Procedure Laterality Date   ARTHROSCOPIC HAGLUNDS REPAIR     BREAST CYST EXCISION Right 11/21/2012   Procedure: CYST EXCISION BREAST;  Surgeon: Currie Paris, MD;  Location: [facility];  Service: General;  Laterality: Right;   BREAST CYST EXCISION Bilateral 01/18/2022   Procedure: EXCISION OF SEBACEOUS CYST BILATERAL BREAST;  Surgeon:  Harriette Bouillon, MD;  Location: [facility];  Service: General;  Laterality: Bilateral;   BREAST EXCISIONAL BIOPSY Right 11/2012   CESAREAN SECTION  04/19/1993   CHOLECYSTECTOMY     COLONOSCOPY  05/02/2011   Procedure: COLONOSCOPY;  Surgeon: Vertell Novak., MD;  Location: [facility];  Service: Endoscopy;  Laterality: N/A;   colonoscopy  11/04/2014   DENTAL SURGERY  03/06/2018   2 surgeries ( 03/06/2018 and 04/06/2018)   to remove two separate benign tumors   ESOPHAGEAL MANOMETRY N/A 11/04/2015   Procedure: ESOPHAGEAL MANOMETRY (EM);  Surgeon: Carman Ching, MD;  Location: [facility];  Service: Endoscopy;  Laterality: N/A;   ESOPHAGOGASTRODUODENOSCOPY  05/02/2011   Procedure: ESOPHAGOGASTRODUODENOSCOPY (EGD);  Surgeon: Vertell Novak., MD;  Location: [facility];  Service: Endoscopy;  Laterality: N/A;   ESOPHAGOGASTRODUODENOSCOPY N/A 09/01/2014   Procedure: ESOPHAGOGASTRODUODENOSCOPY (EGD);  Surgeon: Carman Ching, MD;  Location: [facility];  Service: Endoscopy;  Laterality: N/A;   KNEE SURGERY     LAPAROSCOPY     x 4   LESION REMOVAL N/A 01/18/2022   Procedure: EXCISION OF SEBACEOUS CYSTS CHEST AND NECK;  Surgeon: Harriette Bouillon, MD;  Location: [facility];  Service: General;  Laterality: N/A;   PH IMPEDANCE STUDY N/A 11/04/2015   Procedure: PH IMPEDANCE STUDY;  Surgeon: Fayrene Fearing  Randa Evens, MD;  Location: [facility];  Service: Endoscopy;  Laterality: N/A;   VAGINAL HYSTERECTOMY  2010   TVH--ovaries remain    Family History  Problem Relation Age of Onset   Colon cancer Father 29   Hypertension Father    Stroke Father    Heart disease Father    Heart attack Father    Hyperlipidemia Father    Colon cancer Mother 31   Diabetes Maternal Uncle    Stroke Paternal Grandmother    Heart attack Paternal Grandmother    Heart attack Maternal Grandmother    Heart attack Maternal Grandfather    Cancer Maternal Uncle    Hypertension Sister    Diabetes  Brother    Breast cancer Neg Hx     Social History   Socioeconomic History   Marital status: Married    Spouse name: Fayrene Fearing   Number of children: 2   Years of education: Not on file   Highest education level: Not on file  Occupational History   Occupation: Financial risk analyst  Tobacco Use   Smoking status: Never   Smokeless tobacco: Never  Vaping Use   Vaping status: Never Used  Substance and Sexual Activity   Alcohol use: Not Currently   Drug use: No   Sexual activity: Yes    Partners: Male    Birth control/protection: None, Surgical    Comment: TVH-ovaries remain  Other Topics Concern   Not on file  Social History Narrative   Are you right handed or left handed? Right Handed    Are you currently employed ? 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 No   Who lives with you? Husband and daughters   What type of home do you live in: 1 story or 2 story? Lives in a two story home        Social Determinants of Health   Financial Resource Strain: Not on file  Food Insecurity: No Food Insecurity (04/30/2020)   Hunger Vital Sign    Worried About Running Out of Food in the Last Year: Never true    Ran Out of Food in the Last Year: Never true  Transportation Needs: Not on file  Physical Activity: Not on file  Stress: Not on file  Social Connections: Not on file  Intimate Partner Violence: Not on file     Physical Exam   Vitals:   11/07/22 2355  BP: (!) 154/96  Pulse: 66  Resp: 20  SpO2: 100%    CONSTITUTIONAL: Well-appearing, NAD NEURO/PSYCH:  Alert and oriented x 3, normal and symmetric strength, normal coordination, normal speech, sensation is normal and symmetric to the arms and legs, subtle subjective decrease sensation to the right side of the face. EYES:  eyes equal and reactive ENT/NECK:  no LAD, no JVD CARDIO: Regular rate, well-perfused, normal S1 and S2 PULM:  CTAB no wheezing or rhonchi GI/GU:   non-distended, non-tender MSK/SPINE:  No gross deformities, no edema SKIN:  no rash, atraumatic   *Additional and/or pertinent findings included in MDM below  Diagnostic and Interventional Summary    EKG Interpretation Date/Time:  Tuesday November 08 2022 00:22:18 EST Ventricular Rate:  77 PR Interval:  189 QRS Duration:  96 QT Interval:  411 QTC Calculation: 466 R Axis:   28  Text Interpretation: Sinus rhythm Low voltage, precordial leads Abnormal R-wave progression, early transition Confirmed by Kennis Carina [phone]) on 11/08/2022  12:25:05 AM       Labs Reviewed  CBC  COMPREHENSIVE METABOLIC PANEL  ETHANOL  PROTIME-INR  APTT  TROPONIN I (HIGH SENSITIVITY)    DG Chest Port 1 View  Final Result    CT HEAD WO CONTRAST ( )    (Results Pending)  CT Angio Chest/Abd/Pel for Dissection W and/or Wo Contrast    (Results Pending)    Medications  prochlorperazine (COMPAZINE) injection 10 mg (10 mg Intravenous Given 11/08/22 0019)  diphenhydrAMINE (BENADRYL) injection 25 mg (25 mg Intravenous Given 11/08/22 0016)     Procedures  /  Critical Care Procedures  ED Course and Medical Decision Making  Initial Impression and Ddx Acute onset of chest pain followed by sensory deficit to the right side of the face.  Also with headache that she describes as migraine.  The NIH stroke score is very low, though the onset of the neurological deficit was fairly recent.  Case discussed with Dr. Derry Lory of neurology, given the lack of debilitating deficits and patient's history of GI bleeding, she is not a candidate for TNK and therefore no need to code stroke alert.  We will monitor her NIH stroke scale closely while we will proceed with workup.  Given the chest pain with neurological deficit, aortic dissection is considered.  Will obtain CT imaging and patient will likely need MRI brain if she continues to have neurological symptoms.  Complex migraine is actually favored, providing migraine  cocktail.  Past medical/surgical history that increases complexity of ED encounter: Migraines, DVT  Interpretation of Diagnostics I personally reviewed the EKG and my interpretation is as follows: Sinus rhythm without concerning ischemic features  Labs pending  Patient Reassessment and Ultimate Disposition/Management     CT is nonfunctional here to [facility] emergency department, transferring to [facility] for continued care.  Accepting ED provider is Dr. Zadie Rhine.  Patient management required discussion with the following services or consulting groups:  Neurology  Complexity of Problems Addressed Acute illness or injury that poses threat of life of bodily function  Additional Data Reviewed and Analyzed Further history obtained from: Further history from spouse/family member  Additional Factors Impacting ED Encounter Risk Consideration of hospitalization  Elmer Sow. Pilar Plate, MD [facility] [email or web address]  Final Clinical Impressions(s) / ED Diagnoses     ICD-10-CM   1. Chest pain, unspecified type  R07.9     2. Sensory deficit present  R44.9       ED Discharge Orders     None        Discharge Instructions Discussed with and Provided to Patient:   Discharge Instructions   None      Sabas Sous, MD 11/08/22 0025

## 2022-11-08 NOTE — ED Notes (Signed)
PATIENT BACK IN HALLWAY 20 FROM MRI. [id number]

## 2022-11-08 NOTE — ED Notes (Addendum)
Patient reports chest pain with radiation to right jaw that started today at 2000. Patient reports sitting down eating whenever the pain started. Patient also reports shortness of breath and numbness to the lips. Patient's airway intact, nonlabored and even respirations. Patient able to move all extremities WNL. No facial droop, drooling, or slurring of words noted.

## 2022-11-08 NOTE — ED Notes (Signed)
[facility] here to transport patient to [facility] ED.

## 2022-11-08 NOTE — ED Notes (Addendum)
Patient has arrived from [facility] . Stable. Walked to the bed without assist.

## 2022-11-08 NOTE — ED Provider Notes (Signed)
Patient transferred from [facility] to facilitate workup.  Patient presented with headache, chest pain, right facial pain and numbness.  Patient received CT head at arrival.  No acute intracranial abnormality noted.  Patient underwent CT angiography of chest abdomen pelvis to rule out dissection, no abnormality noted.  Patient reports improvement of her headache and the numbness on the right side of her face with migraine, but still having some residual symptoms.  Patient therefore underwent MRI.  No acute stroke noted.  Patient will be reassured, no further workup necessary.   Gilda Crease, MD 11/08/22 [phone]

## 2022-11-09 ENCOUNTER — Ambulatory Visit: Payer: Commercial Managed Care - PPO | Attending: Obstetrics & Gynecology

## 2022-11-09 ENCOUNTER — Other Ambulatory Visit (HOSPITAL_COMMUNITY): Payer: Self-pay

## 2022-11-09 DIAGNOSIS — R279 Unspecified lack of coordination: Secondary | ICD-10-CM | POA: Diagnosis not present

## 2022-11-09 DIAGNOSIS — M6281 Muscle weakness (generalized): Secondary | ICD-10-CM

## 2022-11-09 DIAGNOSIS — M62838 Other muscle spasm: Secondary | ICD-10-CM | POA: Diagnosis not present

## 2022-11-09 DIAGNOSIS — R102 Pelvic and perineal pain: Secondary | ICD-10-CM

## 2022-11-09 DIAGNOSIS — M5459 Other low back pain: Secondary | ICD-10-CM | POA: Diagnosis not present

## 2022-11-09 DIAGNOSIS — N94818 Other vulvodynia: Secondary | ICD-10-CM | POA: Diagnosis not present

## 2022-11-09 MED ORDER — GABAPENTIN 100 MG PO CAPS
ORAL_CAPSULE | ORAL | 0 refills | Status: DC
  Filled 2022-11-09: qty 90, 37d supply, fill #0

## 2022-11-09 NOTE — Therapy (Signed)
OUTPATIENT PHYSICAL THERAPY FEMALE PELVIC TREATMENT   Patient Name: Karen [name] MRN: [id number] DOB:04/23/1970, 52 y.o., female Today's Date: 11/09/2022  END OF SESSION:  PT End of Session - 11/09/22 1028     Visit Number 20    Date for PT Re-Evaluation 04/26/23    Authorization Type [employer]    PT Start Time 1027    PT Stop Time 1058    PT Time Calculation (min) 31 min    Activity Tolerance Patient tolerated treatment well    Behavior During Therapy [facility] for tasks assessed/performed                    Past Medical History:  Diagnosis Date   Adenomyosis    Anemia    Arthritis 2013   L knee gets steroid injections    Asthmatic bronchitis 01/31/2017   Back pain    Chronic constipation    Diarrhea    DVT of lower extremity (deep venous thrombosis) (HCC)    Dysmenorrhea    Endometriosis    Esophageal stricture    GERD (gastroesophageal reflux disease)    History of kidney stones    History of uterine fibroid    IBS (irritable bowel syndrome)    Interstitial cystitis    Lactose intolerance    Migraine    with aura   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   PONV (postoperative nausea and vomiting)    Recurrent upper respiratory infection (URI)    Sebaceous cyst of breast, right lower inner quadrant 10/25/2012   Excised 11/21/12    Sleep apnea    Syncope, non cardiac    Past Surgical History:  Procedure Laterality Date   ARTHROSCOPIC HAGLUNDS REPAIR     BREAST CYST EXCISION Right 11/21/2012   Procedure: CYST EXCISION BREAST;  Surgeon: Currie Paris, MD;  Location: [facility];  Service: General;  Laterality: Right;   BREAST CYST EXCISION Bilateral 01/18/2022   Procedure: EXCISION OF SEBACEOUS CYST BILATERAL BREAST;  Surgeon: Harriette Bouillon, MD;  Location: [facility];  Service: General;  Laterality: Bilateral;   BREAST EXCISIONAL BIOPSY Right 11/2012   CESAREAN SECTION  04/19/1993   CHOLECYSTECTOMY      COLONOSCOPY  05/02/2011   Procedure: COLONOSCOPY;  Surgeon: Vertell Novak., MD;  Location: [facility];  Service: Endoscopy;  Laterality: N/A;   colonoscopy  11/04/2014   DENTAL SURGERY  03/06/2018   2 surgeries ( 03/06/2018 and 04/06/2018)   to remove two separate benign tumors   ESOPHAGEAL MANOMETRY N/A 11/04/2015   Procedure: ESOPHAGEAL MANOMETRY (EM);  Surgeon: Carman Ching, MD;  Location: [facility];  Service: Endoscopy;  Laterality: N/A;   ESOPHAGOGASTRODUODENOSCOPY  05/02/2011   Procedure: ESOPHAGOGASTRODUODENOSCOPY (EGD);  Surgeon: Vertell Novak., MD;  Location: [facility];  Service: Endoscopy;  Laterality: N/A;   ESOPHAGOGASTRODUODENOSCOPY N/A 09/01/2014   Procedure: ESOPHAGOGASTRODUODENOSCOPY (EGD);  Surgeon: Carman Ching, MD;  Location: [facility];  Service: Endoscopy;  Laterality: N/A;   KNEE SURGERY     LAPAROSCOPY     x 4   LESION REMOVAL N/A 01/18/2022   Procedure: EXCISION OF SEBACEOUS CYSTS CHEST AND NECK;  Surgeon: Harriette Bouillon, MD;  Location: [facility];  Service: General;  Laterality: N/A;   PH IMPEDANCE STUDY N/A 11/04/2015   Procedure: PH IMPEDANCE STUDY;  Surgeon: Carman Ching, MD;  Location: [facility];  Service: Endoscopy;  Laterality: N/A;   VAGINAL HYSTERECTOMY  2010   TVH--ovaries remain  Patient Active Problem List   Diagnosis Date Noted   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   Elevated LFTs 04/04/2021   Colitis 04/04/2021   Bacterial overgrowth syndrome 05/21/2020   Obesity 05/21/2020   History of endometriosis 05/21/2020   Constipation due to outlet dysfunction 02/05/2020   Gastroesophageal reflux disease 02/05/2020   Obstructive sleep apnea treated with continuous positive airway pressure (CPAP) 06/16/2017   Perennial allergic rhinitis with a nonallergic component 01/31/2017   Asthmatic bronchitis 01/31/2017   GI symptoms 01/31/2017   Family history of colon cancer 01/27/2015   Chest pain 12/13/2012   Dyspnea  12/13/2012   DVT of lower extremity (deep venous thrombosis) (HCC) 01/03/1990    PCP: Lorenda Ishihara, MD  REFERRING PROVIDER: Jerene Bears, MD   REFERRING DIAG: M62.89 (ICD-10-CM) - Pelvic floor dysfunction  THERAPY DIAG:  Pelvic pain - Plan: PT plan of care cert/re-cert  Other low back pain - Plan: PT plan of care cert/re-cert  Muscle weakness (generalized) - Plan: PT plan of care cert/re-cert  Other muscle spasm - Plan: PT plan of care cert/re-cert  Unspecified lack of coordination - Plan: PT plan of care cert/re-cert  Rationale for Evaluation and Treatment: Rehabilitation  ONSET DATE: chronic  SUBJECTIVE:                                                                                                                                                                                           SUBJECTIVE STATEMENT: Pt states that she was very sick over the weekend and ended up going to the ER yesterday. They performed MRI, CT scan, and performed full work up with no significant findings. She was concerned that she was getting obstruction, but they are uncertain at this time. She is still having very frequent bowel movements. She is feeling very irritated in vulva/anal area due to all the diarrhea. New medication that she was applying caused a lot of burning she she stopped using.   PAIN:  Are you having pain? Yes NPRS scale: 3/10 Pain location:  Lower abdominal pain, Lt sided pain, low back pain  Pain type: turning, knotted, pressure Pain description: intermittent and constant   Aggravating factors: constipation or frequent bowel movements/constantly having bowel movements  Relieving factors: exercises (bowel massage, happy baby, spinal twists, walking)  PRECAUTIONS: None  WEIGHT BEARING RESTRICTIONS: No  FALLS:  Has patient fallen in last 6 months? No  LIVING ENVIRONMENT: Lives with: lives with their spouse Lives in: House/apartment  OCCUPATION: nurse, in  admin  PLOF: Independent  PATIENT GOALS: decrease pain and have more normal bowel movements  PERTINENT HISTORY:  Vaginal hysterectomy, DVT LE, endometriosis,  interstitial cystitis, exploratory laps for endometriosis, c-section, cholecystectomy Sexual abuse: No  BOWEL MOVEMENT: Pain with bowel movement: Yes Type of bowel movement:Type (Bristol Stool Scale) 1-7 (sometimes full range in the same bowel movement), Frequency sometimes many times a day, sometimes up to 4 days in between, and Strain Yes Fully empty rectum: No Leakage: No Pads: No Fiber supplement: No - has attempted metamucil and it made constipation worse  URINATION: Pain with urination: Yes Fully empty bladder: No Stream:  varies  Urgency: Yes: all the time Frequency: multiple times an hours  Leakage: Urge to void, Walking to the bathroom, Coughing, Sneezing, Laughing, and Exercise Pads: Yes: daily  INTERCOURSE: Pain with intercourse: Initial Penetration and During Penetration Ability to have vaginal penetration:  Yes: with pain Climax: non painful WNL   PREGNANCY: Vaginal deliveries 1 Tearing Yes: 3rd degree tear C-section deliveries 1 Currently pregnant No  PROLAPSE: Periodically will feel vaginal bulging, heaviness in lower abdomen   OBJECTIVE:  11/09/22: No internal rectal or vaginal pelvic floor exam performed due to high level of skin irritation   Weak transversus abdominus contraction  Abdominal restriction and tenderness in bil lower quadrant  Unable to perform pelvic tilt with appropriate coordination  LUMBARAROM/PROM:  A/PROM A/PROM  Eval (% available) 11/09/22 (% available)  Flexion 50 90  Extension 25, pain anteriorly  25, pain in low back  Right lateral flexion 50, pain anteriorly  75, pain on right  Left lateral flexion 50, pain anteriorly  75, pain on right   (Blank rows = not tested) 07/21/22: standing prolapse assessment demonstrated grade 2 anterior vaginal wall laxity    06/08/22:               External Perineal Exam: WNL                              Internal Pelvic Floor: burning reported with palpation of superficial muscles; deep aching/pressure with palpation of Lt levator ani   Patient confirms identification and approves PT to assess internal pelvic floor and treatment Yes  PELVIC MMT:   MMT eval  Vaginal 3/5  Internal Anal Sphincter 1/5  External Anal Sphincter 2/5  Puborectalis 1/5  Diastasis Recti   (Blank rows = not tested)        TONE: High in Lt levator ani   PROLAPSE: Not able to tell this session due to dyssynergic pelvic floor contraction     06/01/22: COGNITION: Overall cognitive status: Within functional limits for tasks assessed     SENSATION: Light touch: Appears intact Proprioception: Appears intact  GAIT: Comments: decreased hip extension, forward flexed trunk  POSTURE: rounded shoulders, forward head, decreased lumbar lordosis, increased thoracic kyphosis, posterior pelvic tilt, and flexed trunk    LUMBARAROM/PROM:  A/PROM A/PROM  Eval (% available)  Flexion 50  Extension 25, pain anteriorly   Right lateral flexion 50, pain anteriorly   Left lateral flexion 50, pain anteriorly    (Blank rows = not tested)   PALPATION:   General  Significant abdominal restriction and tenderness; decreased rib mobility with mobilization and breathing    TODAY'S TREATMENT:  DATE:  RE-EVAL Neuromuscular re-education: Transversus abdominus training with multimodal cues for improved motor control and breath coordination  Supine pelvic tilts on dynadisc 2 x 10 Supine hip adduction with transversus abdominus activation on dynadisc 2 x 10 Supine UE ball press with transversus abdominus 2 x 10 Supine march on dynadisc 2 x 10 Supine bridge with adduction and transversus abdominus 2 x  10 Exercises: Seated lateral flexion 10x bil Seated forward fold 10 breaths Lower trunk rotation 2 x 10   11/01/22 Manual: Bowel mobilization  Neuromuscular re-education: Swiss ball activities: Circles 20x bil Lateral glides 20x bil Pelvic tilts 2 x 10 Pelvic floor bulge 10x Pelvic floor muscle contraction training with external feedback at perineum in supine  Push training with appropriate breathing and external tactile cues at coccyx in sidelying  Modified cat cow on table 10x   10/26/22 Neuromuscular re-education: Down training: Butterfly Child's pose Cat cow Happy baby modified Modified spinal twist bil Diaphragmatic breathing training with hands on chest and belly; multimodal cues to help decrease apical/chest breathing pattern and focus on lower abdominal expansion   PATIENT EDUCATION:  Education details: See above Person educated: Patient Education method: Explanation, Demonstration, Tactile cues, Verbal cues, and Handouts Education comprehension: verbalized understanding  HOME EXERCISE PROGRAM: [id number]  ASSESSMENT:  CLINICAL IMPRESSION: Pt has seen some progress with improved relaxation and ease of bowel movements. However, she is still having severe difficulty with motility in general and ends up having large stool burden every couple of weeks. She is working with MD in order to see if different medications will help improve motility. Issues have been complicated by vulvar/anal skin irritation, making internal work difficult to perform. She sees some benefit from abdominal myofascial release/bowel mobilization. We have tried e-stim for posterior tibial stimulation and on abdomen to help increase gut motility, but believe she will need to purchase and perform on a daily basis in order to see actual benefit if there will be any. She demonstrates significant difficulty with motor control in muscles surrounding pelvis, possibly also making it difficulty to correctly  bear down/bulge with appropriate pressure increases in abdomen during bowel movements to evacuate completely. We have been working on improving this coordination while also progressing mobility activities. She demonstrates tightness/pain in Rt low back and flank that is limiting lumbar A/ROM still, but there have been some improvements. She is able to activate deep core, but remains weak - coordination with breathing is there. We worked on mobility exercises and gentle core exercises to improve mobility and proprioception this session with good tolerance. She will continue to benefit from skilled PT intervention in order to decrease pain, improve bowel movements, and decrease urinary urgency/incontinence.     OBJECTIVE IMPAIRMENTS: decreased activity tolerance, decreased coordination, decreased endurance, decreased mobility, decreased strength, increased fascial restrictions, increased muscle spasms, impaired tone, postural dysfunction, and pain.   ACTIVITY LIMITATIONS: lifting, bending, continence, and locomotion level  PARTICIPATION LIMITATIONS: interpersonal relationship, community activity, and occupation  PERSONAL FACTORS: 1 comorbidity: medical history  are also affecting patient's functional outcome.   REHAB POTENTIAL: Good  CLINICAL DECISION MAKING: Stable/uncomplicated  EVALUATION COMPLEXITY: Low   GOALS: Goals reviewed with patient? Yes  SHORT TERM GOALS: Target date: 07/06/22 - updated 07/12/22 - updated 08/18/22 - updated 09/22/22 - updated 10/26/22 - updated 11/09/22  Pt will be independent with HEP.   Baseline: Goal status: MET 07/12/22  2.  Pt will be independent with diaphragmatic breathing and down training activities in order to improve pelvic floor  relaxation.  Baseline:  Goal status: MET 07/12/22  3.  Pt will be independent with the knack, urge suppression technique, and double voiding in order to improve bladder habits and decrease urinary incontinence.   Baseline:  Goal  status: MET 09/22/22  4.  Pt will be independent with use of squatty potty, relaxed toileting mechanics, and improved bowel movement techniques in order to increase ease of bowel movements and complete evacuation.   Baseline:  Goal status: MET 11/09/22  5.  Pt will be able to correctly perform diaphragmatic breathing and appropriate pressure management in order to prevent worsening vaginal wall laxity and improve pelvic floor A/ROM.   Baseline:  Goal status: IN PROGRESS  LONG TERM GOALS: Target date: 11/16/2022 - updated 07/12/22 - updated 08/18/22 - updated 09/22/22 - updated 10/26/22 - updated 11/09/22  Pt will be independent with advanced HEP.   Baseline:  Goal status: IN PROGRESS  2.  Pt will demonstrate normal pelvic floor muscle tone and A/ROM, able to achieve 4/5 strength with contractions and 10 sec endurance, in order to provide appropriate lumbopelvic support in functional activities.   Baseline:  Goal status: IN PROGRESS  3.  Pt will increase all impaired lumbar A/ROM by 25% without pain.  Baseline:  Goal status:  IN PROGRESS  4.  Pt will report pain no higher than 3/10 with any activity. Baseline:  Goal status:  IN PROGRESS  5.  Pt will report 0/10 pain with vaginal penetration in order to improve intimate relationship with partner.    Baseline:  Goal status:  IN PROGRESS  6.  Pt will be able to go 2-3 hours in between voids without urgency or incontinence in order to improve QOL and perform all functional activities with less difficulty.   Baseline:  Goal status: IN PROGRESS  7.  Pt will report no leaks with laughing, coughing, sneezing in order to improve comfort with interpersonal relationships and community activities.   Baseline:  Goal status:  IN PROGRESS  8.  Pt will have consistent bowel movements 4-5x/week without straining or pain.  Baseline:  Goal status:  IN PROGRESS  PLAN:  PT FREQUENCY: 1-2x/week  PT DURATION: 6 months  PLANNED  INTERVENTIONS: Therapeutic exercises, Therapeutic activity, Neuromuscular re-education, Balance training, Gait training, Patient/Family education, Self Care, Joint mobilization, Dry Needling, Biofeedback, and Manual therapy  PLAN FOR NEXT SESSION: Continue to work on motor control activities surrounding pelvis; progress mobility; manual techniques as needed for comfort and to increase circulation.   Julio Alm, PT, DPT11/06/2408:54 AM

## 2022-11-10 ENCOUNTER — Encounter: Payer: Self-pay | Admitting: Occupational Therapy

## 2022-11-10 ENCOUNTER — Ambulatory Visit: Payer: Commercial Managed Care - PPO | Attending: Surgery | Admitting: Occupational Therapy

## 2022-11-10 ENCOUNTER — Encounter: Payer: Self-pay | Admitting: Neurology

## 2022-11-10 ENCOUNTER — Other Ambulatory Visit (HOSPITAL_COMMUNITY): Payer: Self-pay

## 2022-11-10 ENCOUNTER — Ambulatory Visit (INDEPENDENT_AMBULATORY_CARE_PROVIDER_SITE_OTHER): Payer: Commercial Managed Care - PPO | Admitting: Neurology

## 2022-11-10 ENCOUNTER — Telehealth: Payer: Self-pay | Admitting: Neurology

## 2022-11-10 VITALS — BP 120/78 | HR 72 | Ht 67.5 in | Wt 200.8 lb

## 2022-11-10 DIAGNOSIS — I890 Lymphedema, not elsewhere classified: Secondary | ICD-10-CM | POA: Diagnosis not present

## 2022-11-10 DIAGNOSIS — R29818 Other symptoms and signs involving the nervous system: Secondary | ICD-10-CM | POA: Diagnosis not present

## 2022-11-10 DIAGNOSIS — R634 Abnormal weight loss: Secondary | ICD-10-CM | POA: Diagnosis not present

## 2022-11-10 DIAGNOSIS — G4733 Obstructive sleep apnea (adult) (pediatric): Secondary | ICD-10-CM

## 2022-11-10 DIAGNOSIS — G43909 Migraine, unspecified, not intractable, without status migrainosus: Secondary | ICD-10-CM | POA: Diagnosis not present

## 2022-11-10 DIAGNOSIS — G5712 Meralgia paresthetica, left lower limb: Secondary | ICD-10-CM

## 2022-11-10 DIAGNOSIS — F439 Reaction to severe stress, unspecified: Secondary | ICD-10-CM | POA: Diagnosis not present

## 2022-11-10 DIAGNOSIS — Z789 Other specified health status: Secondary | ICD-10-CM

## 2022-11-10 MED ORDER — RIZATRIPTAN BENZOATE 5 MG PO TABS
5.0000 mg | ORAL_TABLET | ORAL | 0 refills | Status: AC | PRN
  Filled 2022-11-10: qty 10, 30d supply, fill #0

## 2022-11-10 NOTE — Progress Notes (Signed)
Subjective:    Patient ID: Karen [name] is a 52 y.o. female.  HPI    Karen Foley, MD, PhD [facility] [address], [address] [address] [place], [place] [address]  I saw patient, Karen [name], as a referral from the emergency room for paresthesias.  The patient is unaccompanied today.  Karen [name] is a 52 year old female with an underlying medical history of anemia, asthma, DVT, endometriosis, reflux disease, kidney stones, irritable bowel syndrome, interstitial cystitis, migraine headaches, arthritis, back pain, history of syncope, mild obstructive sleep apnea (not on PAP therapy) and mild obesity, who reports a recent episode of sudden onset of right lower face pain and neck pain, tingling, and developing a headache afterwards.  The headache was on the right side, not as severe as she recalls her previous migraines though.  She has a remote history of migraines in her 7s and they were rather sporadic.  She did not have any prescription medications in the past for her migraines and they improved after she had a hysterectomy, they were related to her endometriosis as she recalls.  She does not have a history of sudden onset of one-sided weakness or numbness or tingling or droopy face or slurring of speech otherwise.  She has intermittent tingling in her left thigh area, this comes and goes and is not a new problem.  She has had soreness in her vulvar area and has seen GYN and even dermatology for this, she had a biopsy.  She was diagnosed with vulvodynia and was given a prescription for gabapentin but has not tried it yet.  She has significant GI issues.  She continues to suffer from nausea and vomiting and severe constipation, she is supposed to start back on Motegrity.  She is taking MiraLAX 2-3 times a day.  She had to have esophageal dilatation in the past.  She does have quite a bit of stress.  She was also diagnosed with being heterozygous for hemochromatosis and has  seen hematology.  She presented to the emergency room on 11/07/2022 with chest pain.  She reported right facial pain and numbness as well as a headache at the time.  I reviewed the emergency room records.  She was treated symptomatically for her headache, which was felt to be a migraine, with diphenhydramine and prochlorperazine.  Laboratory testing showed benign troponin levels, INR normal, PTT normal, CBC without differential unremarkable, CMP benign.  Ethanol level was negative.  She had a head CT without contrast on 11/08/2022 and I reviewed the results: Impression: No acute intracranial abnormality.  In addition, I personally and independently reviewed images through the PACS system.   She also had a CT angiogram of the chest, abdomen and pelvis.  I reviewed the results:  IMPRESSION: 1. No evidence aortic aneurysm or dissection. 2. No acute process in the chest, abdomen, or pelvis. 3. Small hiatal hernia. 4. Moderate amount of retained stool in the colon, suggesting constipation.   She had a brain MRI without contrast on 11/08/2022 and I reviewed the results: Impression: Normal noncontrast MRI appearance of the brain.  In addition, I personally and independently reviewed images through the PACS system.  I had evaluated her years ago for sleep apnea concern.  She had a baseline sleep study through our office on 04/25/2017 which showed a total AHI of 6/h, REM AHI of 30.5/h, supine AHI of 8.2/h, O2 nadir 80%.  She could not tolerate AutoPap therapy.  She was encouraged to work on [employer]  loss.  I made a referral to medical weight management clinic.  She was also encouraged to look into an oral appliance through dentistry and contact our office if she found a dentist that was in her insurance network.  Of note, she had an EMG/NCV test through [facility] neurology on 11/03/2021 and I reviewed the results: Impression: This is a normal study of the lower extremities.  In particular, there is no evidence of a  large fiber sensorimotor polyneuropathy or lumbosacral radiculopathy.  Study was interpreted by Dr. Allena Katz.  Indication at the time was bilateral leg pain and paresthesias, worse on the left.    Previously:   08/10/2017 52 year old right-handed woman with an underlying medical history of mild intermittent asthma, irritable bowel syndrome, food intolerances, allergic rhinitis, interstitial cystitis, history of DVT in 1992, reflux disease, migraine headaches, history of vertigo, kidney stones, and borderline obesity, who presents for follow-up consultation of her obstructive sleep apnea on AutoPap therapy. The patient is unaccompanied today. I first met her on 03/23/2017 at the request of her primary care provider, at which time she reported snoring and witnessed apneas per husband's report as well as daytime somnolence. She was advised to proceed with sleep study testing. She had a baseline sleep study on 04/25/2017. I went over her test results with her in detail today. Sleep latency was 2 minutes, REM latency 93 minutes, sleep efficiency 91.7%. She had an increased percentage of stage II sleep, slow-wave sleep was absent and REM sleep was reduced at 13.4%. Total AHI was in the mild range at 6 per hour, REM AHI severe at 30.5 per hour, supine AHI 8.2 per hour. Average oxygen saturation was 95%, nadir was 80% during supine REM sleep. Based on her test results and sleep related complaints I suggested a trial of AutoPap therapy.   I reviewed her AutoPap compliance data from 07/10/2017 through 08/08/2017 which is a total of 30 days, during which time she used her AutoPap 26 days with percent used days greater than 4 hours at 77%, indicating adequate compliance with an average usage of 4 hours and 47 minutes, residual AHI at goal at 2.1 per hour, 95th percentile pressure at 8.7 cm with a range of 5 cm to 9 cm with EPR. Leak on the low side with the 95th percentile at 5.1 L/m. She reports still struggling with the  AutoPap pressure and the mask. She does not feel comfortable. She has had no improvement in her reflux or GI symptoms. Furthermore, she feels like she has been struggling with respiratory symptoms. She has been treated for asthma, even needed steroids which in turn caused her to gain more weight. She really did try to be compliant with treatment but is not raeping any benefit from it. For consideration of an oral appliance she has seen Dr. Toni Arthurs and also her own dentist but unfortunately, both dentists our out of network.    She saw Karen [name], nurse practitioner in the interim on 06/16/2017 at which time she was compliant with AutoPap therapy but was complaining of bloating. She was encouraged to continue to try using AutoPap if possible and monitor her Sx until her next appt.   03/23/2017: (She) reports snoring and excessive daytime somnolence as well as apneic pauses while asleep per husband's report. I reviewed your office note from 03/10/2017, which you kindly included. Her Epworth sleepiness score is 11 out of 24 today, fatigue score is 38/63. She has woken up with a sense of choking. She has  had recurrent respiratory infections. She works as a Financial risk analyst. She lives at home with her husband and 2 children. She is a nonsmoker and drinks alcohol rarely, maybe once a year or so. Caffeine several times a week but not necessarily daily. Had recent GI issues including irritable bowel syndrome, food intolerance, for the past year or 2. She has lost about 50 pounds. She also started having recurrent respiratory infections, allergy flareups. She has seen an allergy specialist for this. She was started on new medications. She has been doing physical therapy for pelvic floor exercises. Bedtime is around 11 PM and she does not have significant difficulty falling asleep. She feels exhausted. Rise time is around 5 AM. She has nocturia every night. She has woken up with a headache. She has pauses in her  breathing. She denies telltale symptoms of restless leg syndrome.    Her Past Medical History Is Significant For: Past Medical History:  Diagnosis Date   Adenomyosis    Anemia    Arthritis 2013   L knee gets steroid injections    Asthmatic bronchitis 01/31/2017   Back pain    Chronic constipation    Diarrhea    DVT of lower extremity (deep venous thrombosis) (HCC)    Dysmenorrhea    Endometriosis    Esophageal stricture    GERD (gastroesophageal reflux disease)    History of kidney stones    History of uterine fibroid    IBS (irritable bowel syndrome)    Interstitial cystitis    Lactose intolerance    Migraine    with aura   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   PONV (postoperative nausea and vomiting)    Recurrent upper respiratory infection (URI)    Sebaceous cyst of breast, right lower inner quadrant 10/25/2012   Excised 11/21/12    Sleep apnea    Syncope, non cardiac     Her Past Surgical History Is Significant For: Past Surgical History:  Procedure Laterality Date   ARTHROSCOPIC HAGLUNDS REPAIR     BREAST CYST EXCISION Right 11/21/2012   Procedure: CYST EXCISION BREAST;  Surgeon: Currie Paris, MD;  Location: [facility];  Service: General;  Laterality: Right;   BREAST CYST EXCISION Bilateral 01/18/2022   Procedure: EXCISION OF SEBACEOUS CYST BILATERAL BREAST;  Surgeon: Harriette Bouillon, MD;  Location: [facility];  Service: General;  Laterality: Bilateral;   BREAST EXCISIONAL BIOPSY Right 11/2012   CESAREAN SECTION  04/19/1993   CHOLECYSTECTOMY     COLONOSCOPY  05/02/2011   Procedure: COLONOSCOPY;  Surgeon: Vertell Novak., MD;  Location: [facility];  Service: Endoscopy;  Laterality: N/A;   colonoscopy  11/04/2014   DENTAL SURGERY  03/06/2018   2 surgeries ( 03/06/2018 and 04/06/2018)   to remove two separate benign tumors   ESOPHAGEAL MANOMETRY N/A 11/04/2015   Procedure: ESOPHAGEAL MANOMETRY (EM);  Surgeon: Carman Ching, MD;   Location: [facility];  Service: Endoscopy;  Laterality: N/A;   ESOPHAGOGASTRODUODENOSCOPY  05/02/2011   Procedure: ESOPHAGOGASTRODUODENOSCOPY (EGD);  Surgeon: Vertell Novak., MD;  Location: [facility];  Service: Endoscopy;  Laterality: N/A;   ESOPHAGOGASTRODUODENOSCOPY N/A 09/01/2014   Procedure: ESOPHAGOGASTRODUODENOSCOPY (EGD);  Surgeon: Carman Ching, MD;  Location: [facility];  Service: Endoscopy;  Laterality: N/A;   KNEE SURGERY     LAPAROSCOPY     x 4   LESION REMOVAL N/A 01/18/2022   Procedure: EXCISION OF SEBACEOUS CYSTS CHEST AND NECK;  Surgeon: Harriette Bouillon, MD;  Location: [facility];  Service: General;  Laterality: N/A;   PH IMPEDANCE STUDY N/A 11/04/2015   Procedure: PH IMPEDANCE STUDY;  Surgeon: Carman Ching, MD;  Location: [facility];  Service: Endoscopy;  Laterality: N/A;   VAGINAL HYSTERECTOMY  2010   TVH--ovaries remain    Her Family History Is Significant For: Family History  Problem Relation Age of Onset   Colon cancer Father 28   Hypertension Father    Stroke Father    Heart disease Father    Heart attack Father    Hyperlipidemia Father    Colon cancer Mother 31   Diabetes Maternal Uncle    Stroke Paternal Grandmother    Heart attack Paternal Grandmother    Heart attack Maternal Grandmother    Heart attack Maternal Grandfather    Cancer Maternal Uncle    Hypertension Sister    Diabetes Brother    Breast cancer Neg Hx     Her Social History Is Significant For: Social History   Socioeconomic History   Marital status: Married    Spouse name: Karen [name]   Number of children: 2   Years of education: Not on file   Highest education level: Not on file  Occupational History   Occupation: Financial risk analyst  Tobacco Use   Smoking status: Never   Smokeless tobacco: Never  Vaping Use   Vaping status: Never Used  Substance and Sexual Activity   Alcohol use: Not Currently   Drug use: No   Sexual activity: Yes    Partners: Male     Birth control/protection: None, Surgical    Comment: TVH-ovaries remain  Other Topics Concern   Not on file  Social History Narrative   Are you right handed or left handed? Right Handed    Are you currently employed ? 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 No   Who lives with you? Husband and daughters   What type of home do you live in: 1 story or 2 story? Lives in a two story home    Caffiene 3 times week.  decaffienated mostly       Social Determinants of Health   Financial Resource Strain: Not on file  Food Insecurity: No Food Insecurity (04/30/2020)   Hunger Vital Sign    Worried About Running Out of Food in the Last Year: Never true    Ran Out of Food in the Last Year: Never true  Transportation Needs: Not on file  Physical Activity: Not on file  Stress: Not on file  Social Connections: Not on file    Her Allergies Are:  Allergies  Allergen Reactions   Molds & Smuts Cough    sneezing  :   Her Current Medications Are:  Outpatient Encounter Medications as of 11/10/2022  Medication Sig   Acetaminophen 500 MG capsule 1 capsule as needed Orally every 6 hrs   albuterol (VENTOLIN HFA) 108 (90 Base) MCG/ACT inhaler Inhale 2 puffs into the lungs every 6 (six) hours as needed for wheezing or shortness of breath.   Elderberry 575 MG/5ML SYRP Take 30 mLs by mouth daily.   fluticasone (FLONASE) 50 MCG/ACT nasal spray Place 2 sprays into both nostrils daily.   hydrOXYzine (ATARAX) 25 MG tablet Take 1 tablet (25 mg total) by mouth at bedtime as needed.   Lactobacillus Casei-Folic Acid (RESTORA RX) 60-1.25 MG CAPS Take 1 capsule by mouth daily between meals  lansoprazole (PREVACID) 30 MG capsule Take 30 mg by mouth 2 (two) times daily before a meal.   levocetirizine (XYZAL) 5 MG tablet Take 1 tablet (5 mg total) by mouth every evening. (Patient taking differently: Take 5 mg by mouth daily as needed for allergies.)    Lifitegrast (XIIDRA) 5 % SOLN Place 1 drop into both eyes 2 (two) times daily.   ondansetron (ZOFRAN-ODT) 4 MG disintegrating tablet Dissolve 1 tablet (4 mg total) in mouth every 8 (eight) hours as needed for nausea   polyethylene glycol (MIRALAX / GLYCOLAX) 17 g packet Take 17 g by mouth every other day.   tacrolimus (PROTOPIC) 0.1 % ointment Apply a thin layer to affected areas twice daily to begin, and then decrease to daily for maintenance.   [DISCONTINUED] lansoprazole (PREVACID) 30 MG capsule Take 1 capsule (30 mg total) by mouth daily.   gabapentin (NEURONTIN) 100 MG capsule Take 1 capsule (100 mg total) by mouth at bedtime for 7 days, THEN 1 capsule (100 mg total) 2 (two) times daily for 7 days, THEN 1 capsule (100 mg total) 3 (three) times daily. (Patient not taking: Reported on 11/10/2022)   Prucalopride Succinate (MOTEGRITY) 1 MG TABS Take 1 tablet by mouth daily. (Patient not taking: Reported on 11/10/2022)   Prucalopride Succinate 2 MG TABS Take 1 tablet (2 mg total) by mouth daily. (Patient not taking: Reported on 11/10/2022)   [DISCONTINUED] clobetasol ointment (TEMOVATE) 0.05 % Apply 1 Application topically as directed 2 (two) times daily. (Patient not taking: Reported on 10/05/2022)   [DISCONTINUED] lansoprazole (PREVACID) 15 MG capsule Take 1 capsule (15 mg total) by mouth daily. (Patient not taking: Reported on 07/11/2022)   [DISCONTINUED] Lifitegrast (XIIDRA) 5 % SOLN Place 1 drop into both eyes 2 (two) times daily.   No facility-administered encounter medications on file as of 11/10/2022.  :   Review of Systems:  Out of a complete 14 point review of systems, all are reviewed and negative with the exception of these symptoms as listed below:  Review of Systems  Neurological:        NX Alina Gilkey (sleep)/Internal ED referral for headache and facial numbness. This week.Mon tues.  Doing better now. Hs migraines.  (Different then previous migraines). Did not tolerate cpap, (abdominal/ GI  issies).     Objective:  Neurological Exam  Physical Exam Physical Examination:   Vitals:   11/10/22 1427  BP: 120/78  Pulse: 72    General Examination: The patient is a very pleasant 52 y.o. female in no acute distress. She appears well-developed and well-nourished and well groomed.   HEENT: Normocephalic, atraumatic, pupils are equal, round and reactive to light and accommodation.  Corrective eyeglasses in place, a touch of cataract bilaterally.  No photophobia.  Extraocular tracking is good without limitation to gaze excursion or nystagmus noted. Normal smooth pursuit is noted. Hearing is grossly intact to tuning fork.  Face is symmetric with normal facial animation and normal facial sensation to light touch, temperature and vibration. Speech is clear, slightly raspy, with no dysarthria noted. There is no hypophonia. There is no lip, neck/head, jaw or voice tremor. Neck is supple with full range of passive and active motion. There are no carotid bruits on auscultation. Oropharynx exam reveals: mild mouth dryness, adequate dental hygiene and mild airway crowding.  Tongue protrudes centrally and palate elevates symmetrically.   Chest: Clear to auscultation without wheezing, rhonchi or crackles noted.   Heart: S1+S2+0, regular and normal without murmurs, rubs or gallops  noted.    Abdomen: Soft, non-tender, maybe slightly distended with sporadic  bowel sounds appreciated on auscultation.   Extremities: There is no pitting edema in the distal lower extremities bilaterally. Left ankle and little wider than right.   Skin: Warm and dry without trophic changes noted.   Musculoskeletal: exam reveals no obvious joint deformities.    Neurologically:  Mental status: The patient is awake, alert and oriented in all 4 spheres. Her immediate and remote memory, attention, language skills and fund of knowledge are appropriate. There is no evidence of aphasia, agnosia, apraxia or anomia. Speech is  clear with normal prosody and enunciation. Thought process is linear. Mood is normal and affect is normal.  Cranial nerves II - XII are as described above under HEENT exam. In addition: shoulder shrug is normal with equal shoulder height noted. Motor exam: Normal bulk, strength and tone is noted. There is no drift, tremor or rebound. Fine motor skills and coordination: grossly intact with normal finger taps, normal hand movements, normal rapid alternating patting, normal foot taps and normal foot agility.  Cerebellar testing: No dysmetria or intention tremor. There is no truncal or gait ataxia.  Normal finger-to-nose, normal heel-to-shin bilaterally. Reflexes are 1+ throughout, toes are downgoing bilaterally. Romberg is negative. Sensory exam: intact to light touch, temperature and vibration sense in the upper and lower extremities.  Gait, station and balance: She stands easily. No veering to one side is noted. No leaning to one side is noted. Posture is age-appropriate and stance is narrow based. Gait shows normal stride length and normal pace. No problems turning are noted.  Initial difficulty with tandem walk but doable, she was walking in heeled shoes.     Assessment and Plan:  In summary, Karen [name] is a with an underlying medical history of anemia, asthma, DVT, endometriosis, reflux disease, kidney stones, irritable bowel syndrome, interstitial cystitis, migraine headaches, arthritis, back pain, history of syncope, mild obstructive sleep apnea (not on PAP therapy) and mild obesity, who presents for evaluation of right facial paresthesias recently.  Symptoms have resolved.  Given that she only had facial symptoms, TIA is unlikely, she had a CT head and MRI of the brain, no acute abnormality seen.  Neurological exam is nonfocal.  She does have a history of migraines remotely, history is supportive of a migraine, possibly complex migraine.  She does have quite a bit of stress currently, suffers  from sleep significant GI issues, has had vulvodynia which is a new problem, diagnosis of hemochromatosis heterozygosity.  She sleeps fairly well, she could not tolerate PAP therapy in the past.  She has lost weight in the interim.  I had a long discussion with the patient regarding multiple issues, her paresthesia in her left thigh area sounds like meralgia paresthetica.  She had an EMG and nerve conduction velocity test through [facility] neurology in 2023 with benign findings.  She is largely reassured but we did talk about migraine triggers and alleviating factors.  I would like for her to trial Maxalt low-dose as needed for migraines, mostly to have something on hand if she needs it.  She is advised to stay well-hydrated and well rested.  Will proceed with a home sleep test to reassess her sleep apnea.  She was not a good candidate for an oral appliance in the past because of issues with her jaw bones.  I suggested we proceed with a carotid Doppler ultrasound to look at the main blood supply of the brain.  She is agreeable with the plan.  Will keep her posted as to her home sleep test results and carotid Doppler test results by phone call.  We will plan to follow-up in this clinic in about 6 months, sooner if needed.  She is advised to stay up-to-date with her eye exam.  She is advised to follow-up with all her other specialists.  She is encouraged to trial the gabapentin as suggested by her dermatologist or her gynecologist for her vulvodynia. I answered all her questions today and she was in agreement.  This was a complex visit.  This was an extended visit of over 60 minutes.

## 2022-11-10 NOTE — Telephone Encounter (Signed)
Pt called, running 5 mins late will be there in 5 mins. Informed patient may have to reschedule. Patient verbalized understand and stated will be there in a few minutes.

## 2022-11-10 NOTE — Therapy (Signed)
OUTPATIENT OCCUPATIONAL THERAPY EVALUATION  LOWER EXTREMITY LYMPHEDEMA  Patient Name: Karen [name] MRN: [id number] DOB:1970-03-24, 52 y.o., female Today's Date: 11/11/2022  END OF SESSION:   OT End of Session - 11/10/22 0925     Visit Number 1    Number of Visits 36    Date for OT Re-Evaluation 02/08/23    OT Start Time 0917    OT Stop Time 1015    OT Time Calculation (min) 58 min    Activity Tolerance Patient tolerated treatment well;No increased pain    Behavior During Therapy [facility] for tasks assessed/performed               Past Medical History:  Diagnosis Date   Adenomyosis    Anemia    Arthritis 2013   L knee gets steroid injections    Asthmatic bronchitis 01/31/2017   Back pain    Chronic constipation    Diarrhea    DVT of lower extremity (deep venous thrombosis) (HCC)    Dysmenorrhea    Endometriosis    Esophageal stricture    GERD (gastroesophageal reflux disease)    History of kidney stones    History of uterine fibroid    IBS (irritable bowel syndrome)    Interstitial cystitis    Lactose intolerance    Migraine    with aura   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   PONV (postoperative nausea and vomiting)    Recurrent upper respiratory infection (URI)    Sebaceous cyst of breast, right lower inner quadrant 10/25/2012   Excised 11/21/12    Sleep apnea    Syncope, non cardiac    Past Surgical History:  Procedure Laterality Date   ARTHROSCOPIC HAGLUNDS REPAIR     BREAST CYST EXCISION Right 11/21/2012   Procedure: CYST EXCISION BREAST;  Surgeon: Currie Paris, MD;  Location: [facility];  Service: General;  Laterality: Right;   BREAST CYST EXCISION Bilateral 01/18/2022   Procedure: EXCISION OF SEBACEOUS CYST BILATERAL BREAST;  Surgeon: Harriette Bouillon, MD;  Location: [facility];  Service: General;  Laterality: Bilateral;   BREAST EXCISIONAL BIOPSY Right 11/2012   CESAREAN SECTION  04/19/1993   CHOLECYSTECTOMY      COLONOSCOPY  05/02/2011   Procedure: COLONOSCOPY;  Surgeon: Vertell Novak., MD;  Location: [facility];  Service: Endoscopy;  Laterality: N/A;   colonoscopy  11/04/2014   DENTAL SURGERY  03/06/2018   2 surgeries ( 03/06/2018 and 04/06/2018)   to remove two separate benign tumors   ESOPHAGEAL MANOMETRY N/A 11/04/2015   Procedure: ESOPHAGEAL MANOMETRY (EM);  Surgeon: Carman Ching, MD;  Location: [facility];  Service: Endoscopy;  Laterality: N/A;   ESOPHAGOGASTRODUODENOSCOPY  05/02/2011   Procedure: ESOPHAGOGASTRODUODENOSCOPY (EGD);  Surgeon: Vertell Novak., MD;  Location: [facility];  Service: Endoscopy;  Laterality: N/A;   ESOPHAGOGASTRODUODENOSCOPY N/A 09/01/2014   Procedure: ESOPHAGOGASTRODUODENOSCOPY (EGD);  Surgeon: Carman Ching, MD;  Location: [facility];  Service: Endoscopy;  Laterality: N/A;   KNEE SURGERY     LAPAROSCOPY     x 4   LESION REMOVAL N/A 01/18/2022   Procedure: EXCISION OF SEBACEOUS CYSTS CHEST AND NECK;  Surgeon: Harriette Bouillon, MD;  Location: [facility];  Service: General;  Laterality: N/A;   PH IMPEDANCE STUDY N/A 11/04/2015   Procedure: PH IMPEDANCE STUDY;  Surgeon: Carman Ching, MD;  Location: [facility];  Service: Endoscopy;  Laterality: N/A;   VAGINAL HYSTERECTOMY  2010   TVH--ovaries remain   Patient Active  Problem List   Diagnosis Date Noted   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   Elevated LFTs 04/04/2021   Colitis 04/04/2021   Bacterial overgrowth syndrome 05/21/2020   Obesity 05/21/2020   History of endometriosis 05/21/2020   Constipation due to outlet dysfunction 02/05/2020   Gastroesophageal reflux disease 02/05/2020   Obstructive sleep apnea treated with continuous positive airway pressure (CPAP) 06/16/2017   Perennial allergic rhinitis with a nonallergic component 01/31/2017   Asthmatic bronchitis 01/31/2017   GI symptoms 01/31/2017   Family history of colon cancer 01/27/2015   Chest pain 12/13/2012   Dyspnea  12/13/2012   DVT of lower extremity (deep venous thrombosis) (HCC) 01/03/1990    PCP: Coral Else, MD  REFERRING PROVIDER: Lorenda Ishihara, MD  REFERRING DIAG: I89.0  THERAPY DIAG:  Lymphedema, not elsewhere classified  Rationale for Evaluation and Treatment: Rehabilitation  ONSET DATE: ~2014; exacerbation in 2023  SUBJECTIVE:                                                                                                                                                                                           SUBJECTIVE STATEMENT:  PERTINENT HISTORY:   PAIN:  Are you having pain? Yes: NPRS scale: 3/10 Pain location: BLE Pain description: tight, heavy, tingling, numbness Aggravating factors: standing, walking, dependent sitting > 30 minutes Relieving factors: elevation  PRECAUTIONS: Other: LYMPHEDEMA; Hx LLE DVT  RED FLAGS: Hx LLE DVT  WEIGHT BEARING RESTRICTIONS: No  FALLS:  Has patient fallen in last 6 months? No  LIVING ENVIRONMENT: Lives with: lives with their family Lives in: House/apartment Stairs: Yes; Internal: 14 steps; yes and External: 4 steps; yes Has following equipment at home: None  OCCUPATION: [employer] operational officer, walking, desk work  LEISURE: family time  HAND DOMINANCE: right   PRIOR LEVEL OF FUNCTION: Independent  PATIENT GOALS: Feel better, be able to be more active without pain, wear preferred street shoes  OBJECTIVE: Note: Objective measures were completed at Evaluation unless otherwise noted.  COGNITION:  Overall cognitive status: Within functional limits for tasks assessed   OBSERVATIONS / OTHER ASSESSMENTS:   POSTURE: WFL  LE ROM: WFL  LE MMT: WFL  LYMPHEDEMA ASSESSMENTS: non-Ca related  INFECTIONS: denies cellulitis and wound hx  BLE COMPARATIVE LIMB VOLUMETRICS TBA RX visit 1  LANDMARK RIGHT   R LEG (A-D) N/A  R THIGH (E-G) ml  R FULL LIMB (A-G) ml  Limb Volume differential (LVD)  %  Volume change since  initial %  Volume change overall V  (Blank rows = not tested)  LANDMARK LEFT   L LEG (A-D) N/A  L THIGH (E-G) ml  L  FULL LIMB (A-G) ml  Limb Volume differential (LVD)  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 Obesity  Skin  Description Hyper-Keratosis Peau d' Orange Shiny Tight Fibrotic/ Indurated Fatty Doughy Spongy/ boggy       R>L x  x   Skin dry Flaky WNL Macerated   mildly      Color Redness Varicosities Blanching Hemosiderin Stain Mottled        x   Odor Malodorous Yeast Fungal infection  WNL      x   Temperature Warm Cool wnl     x    Pitting Edema   1+ 2+ 3+ 4+ Non-pitting         x   Girth Symmetrical Asymmetrical                   Distribution    L>R toes to groin    Stemmer Sign Positive Negative   +L -R   Lymphorrhea History Of:  Present Absent     x    Wounds History Of Present Absent Venous Arterial Pressure Sheer     x        Signs of Infection Redness Warmth Erythema Acute Swelling Drainage Borders                    Sensation Light Touch Deep pressure Hypersensitivity   In tact Impaired In tact Impaired Absent Impaired   x  x  x     Nails WNL   Fungus nail dystrophy   x     Hair Growth Symmetrical Asymmetrical   x    Skin Creases Base of toes  Ankles   Base of Fingers knees       Abdominal pannus Thigh Lobules  Face/neck   x           GAIT: Distance walked: >500' Assistive device utilized: None Level of assistance: Complete Independence Comments: Functional ambulation and transfers [facility]  LYMPHEDEMA LIFE IMPACT SCALE (LLIS): TBA initial Rx visit 2/2 time constraints  FOTO functional outcome measure: TBA initial Rx visit 2/2 time constraints  TODAY'S TREATMENT:                                                                                                                                          Occupational Therapy evaluation Pt edu   PATIENT EDUCATION:  Education details: Provided basic level education regarding lymphatic structure and function, etiology, onset patterns, stages of progression, and prevention to limit infection risk, worsening condition and further functional decline. Pt edu for aught interaction between blood circulatory system and lymphatic circulation.Discussed  impact of gravity and co-morbidities on lymphatic function. Outlined Complete Decongestive Therapy (CDT)  as standard of care and provided in depth  information regarding 4 primary components of Intensive and Self Management Phases, including Manual Lymph Drainage (MLD), compression wrapping and garments, skin care, and therapeutic exercise. Homero Fellers discussion with re need for frequent attendance and high burden of care when caregiver is needed, impact of co morbidities. We discussed  the chronic, progressive nature of lymphedema and Importance of daily, ongoing LE self-care essential for limiting progression and infection risk.  Person educated: Patient Education method: Explanation, Demonstration, and Handouts Education comprehension: verbalized understanding, returned demonstration, and needs further education  HOME EXERCISE PROGRAM: BLE lymphatic pumping there ex using- 1 sets of 10 reps, each exercise in order-  1-2 x daily, bilaterally Simple self MLD 1 x daily Daily skin care to increase hydration, skin mobility and decrease infection risk- can be done during MLD Compression wraps 23/7 during intensive phase of CDT Compression garments/ devices during self-management phase of CDT  ASSESSMENT:  CLINICAL IMPRESSION: Karen [name] is a 52 y.o. female presenting with very mild, stage II, LLE lymphedema 2/2 venous insufficiency and obesity. L Leg swelling and associated pain swelling fluctuates. It has progressed over time, and now no longer resolves with elevation over night. RLE swelling limits balance  during functional ambulation. It exacerbates infection and falls risk. LLE/RLQ lymphedema interferes with functional performance in all occupational domains, including basic and instrumental ADLs, productive activities, leisure pursuits and social participation. Pt will benefit from Occupational Therapy for Complete Decongestive Therapy (CDT) to restore function, to reduce physical and psychologic suffering associated with chronic, progressive lymphedema and associated pain, and to limit infection. CDT will include manual lymphatic drainage (MLD), skin care, therapeutic exercise and compression wraps and garment's devices. Without skilled OT for lymphedema care the condition will worsen and further functional decline is expected  Custom-made gradient compression garments and HOS devices are medically necessary because they are uniquely sized and shaped to fit the exact dimensions of the affected extremities, and to provide appropriate medical grade, graduated compression essential for optimally managing chronic, progressive lymphedema. Multiple custom compression garments are needed to ensure proper hygiene to limit infection risk. Custom compression garments should be replaced q 3-6 months When worn consistently for optimal lipo-lymphedema self-management over time. HOS devices, medically necessary to limit fibrosis buildup in tissue, should be replaced q 2 years and PRN when worn out.      OBJECTIVE IMPAIRMENTS: decreased activity tolerance, decreased knowledge of condition, decreased knowledge of use of DME, increased edema, impaired sensation, pain, and chronic, progressive leg swelling .   ACTIVITY LIMITATIONS: dependent sitting, extended standing and/ or walking, squatting, and lower body dressing, fitting preferred street shoes  PARTICIPATION LIMITATIONS: shopping, community activity, occupation, and yard work  PERSONAL FACTORS: 3+ comorbidities: Hx DVT,  OSA, CVI. Varicose veins  are also  affecting patient's functional outcome.   REHAB POTENTIAL: Good  EVALUATION COMPLEXITY: Moderate   GOALS: Goals reviewed with patient? Yes  SHORT TERM GOALS: Target date: 4th OT Rx visit  SHORT TERM GOALS: Target date: 4th OT Rx visit   Pt will demonstrate understanding of lymphedema precautions and prevention strategies with modified independence using a printed reference to identify at least 5 precautions and discussing how s/he may implement them into daily life to reduce risk of progression with extra time. Baseline:Max A Goal status: INITIAL   Pt will be able to apply multilayer, knee length, gradient, compression wraps to one leg at a time with modified assistance (extra time and assistive device/s) to decrease limb volume, to limit infection risk, and to limit  lymphedema progression.  Baseline: Dependent Goal status: INITIAL  LONG TERM GOALS: Target date: 02/08/23  Given this patient's Intake score of TBA /100% on the functional outcomes FOTO tool, patient will experience an increase in function of 5 points to improve basic and instrumental ADLs performance, including lymphedema self-care.  Baseline: Max A Goal status: INITIAL   Given this patient's Intake score of TBA% on the Lymphedema Life Impact Scale (LLIS), patient will experience a reduction of at least 5 points in her perceived level of functional impairment resulting from lymphedema to improve functional performance and quality of life (QOL). Baseline: 64.71% Goal status: INITIAL  3 Pt will achieve at least a 10% volume reduction in B legs to return limb to typical size and shape, to limit infection risk and LE progression, to decrease pain, to improve function. Baseline: Dependent Goal status: INITIAL  4.  Pt will obtain proper compression garments/devices and achieve modified independence (extra time + assistive devices) with donning/doffing to optimize limb volume reductions and limit LE  progression over  time. Baseline:  Goal status: INITIAL  5.  During Intensive phase CDT , with modified independence, Pt will achieve at least 85% compliance with all lymphedema self-care home program components, including daily skin care, compression wraps and /or garments, simple self MLD and lymphatic pumping therex to habituate LE self care protocol  into ADLs for optimal LE self-management over time. Baseline: Dependent Goal status: INITIAL   PLAN:  PT FREQUENCY: 2x/week  PT DURATION: 12 weeks  PLANNED INTERVENTIONS: 97110-Therapeutic exercises, 97530- Therapeutic activity, 97535- Self Care, [id number]- Manual therapy, Patient/Family education, Manual lymph drainage, Compression bandaging, and fit with appropriate compression garments  PLAN FOR NEXT SESSION:  BLE comparative limb volumetrics Pt edu for LE self care  Loel Dubonnet, MS, OTR/L, CLT-LANA 11/11/22 12:48 PM '

## 2022-11-10 NOTE — Patient Instructions (Addendum)
I am glad to hear that your headache is better.  I am glad to hear that you are overall feeling better.  Nevertheless, we will do additional testing.  We will do a carotid Doppler ultrasound. We will call you with the test results.   For as needed use for migraines: we will try you on Maxalt 5 mg tab: take 1 pill early on when you suspect a migraine attack come on. You may take another pill after 2 hours, no more than 2 pills in 24 hours. Most people who take triptans do not have any serious side-effects. However, they can cause drowsiness (remember to not drive or use heavy machinery when drowsy), nausea, dizziness, dry mouth. Less common side effects include strange sensations, such as tightness in your chest or throat, tingling, flushing, and feelings of heaviness or pressure in areas such as the face, limbs, and chest. These in the chest can mimic heart related pain (angina) and may cause alarm, but usually these sensations are not harmful or a sign of a heart attack. However, if you develop intense chest pain or sensations of discomfort, you should stop taking your medication and consult with me or your PCP or go to the nearest urgent care facility or ER or call 911.    We will do a home sleep test to reassess your sleep apnea.

## 2022-11-11 DIAGNOSIS — K219 Gastro-esophageal reflux disease without esophagitis: Secondary | ICD-10-CM | POA: Diagnosis not present

## 2022-11-11 DIAGNOSIS — R197 Diarrhea, unspecified: Secondary | ICD-10-CM | POA: Diagnosis not present

## 2022-11-11 DIAGNOSIS — J301 Allergic rhinitis due to pollen: Secondary | ICD-10-CM | POA: Diagnosis not present

## 2022-11-11 DIAGNOSIS — G43109 Migraine with aura, not intractable, without status migrainosus: Secondary | ICD-10-CM | POA: Diagnosis not present

## 2022-11-11 DIAGNOSIS — E785 Hyperlipidemia, unspecified: Secondary | ICD-10-CM | POA: Diagnosis not present

## 2022-11-14 ENCOUNTER — Ambulatory Visit: Payer: Commercial Managed Care - PPO

## 2022-11-14 ENCOUNTER — Other Ambulatory Visit (HOSPITAL_COMMUNITY): Payer: Self-pay

## 2022-11-14 DIAGNOSIS — M6281 Muscle weakness (generalized): Secondary | ICD-10-CM

## 2022-11-14 DIAGNOSIS — M62838 Other muscle spasm: Secondary | ICD-10-CM

## 2022-11-14 DIAGNOSIS — R279 Unspecified lack of coordination: Secondary | ICD-10-CM | POA: Diagnosis not present

## 2022-11-14 DIAGNOSIS — R102 Pelvic and perineal pain: Secondary | ICD-10-CM

## 2022-11-14 DIAGNOSIS — M5459 Other low back pain: Secondary | ICD-10-CM

## 2022-11-14 MED ORDER — RESTORA RX 60-1.25 MG PO CAPS
1.0000 | ORAL_CAPSULE | Freq: Every morning | ORAL | 11 refills | Status: AC
  Filled 2022-11-14: qty 30, 30d supply, fill #0
  Filled 2023-02-24: qty 30, 30d supply, fill #1
  Filled 2023-04-05: qty 30, 30d supply, fill #2
  Filled 2023-05-08: qty 30, 30d supply, fill #3
  Filled 2023-06-30: qty 30, 30d supply, fill #4
  Filled 2023-10-26: qty 30, 30d supply, fill #5

## 2022-11-14 NOTE — Therapy (Signed)
OUTPATIENT PHYSICAL THERAPY FEMALE PELVIC TREATMENT   Patient Name: Karen [name] MRN: [id number] DOB:May 02, 1970, 52 y.o., female Today's Date: 11/14/2022  END OF SESSION:  PT End of Session - 11/14/22 1238     Visit Number 21    Date for PT Re-Evaluation 04/26/23    Authorization Type [employer]    PT Start Time 1236    PT Stop Time 1314    PT Time Calculation (min) 38 min    Activity Tolerance Patient tolerated treatment well    Behavior During Therapy [facility] for tasks assessed/performed                     Past Medical History:  Diagnosis Date   Adenomyosis    Anemia    Arthritis 2013   L knee gets steroid injections    Asthmatic bronchitis 01/31/2017   Back pain    Chronic constipation    Diarrhea    DVT of lower extremity (deep venous thrombosis) (HCC)    Dysmenorrhea    Endometriosis    Esophageal stricture    GERD (gastroesophageal reflux disease)    History of kidney stones    History of uterine fibroid    IBS (irritable bowel syndrome)    Interstitial cystitis    Lactose intolerance    Migraine    with aura   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   PONV (postoperative nausea and vomiting)    Recurrent upper respiratory infection (URI)    Sebaceous cyst of breast, right lower inner quadrant 10/25/2012   Excised 11/21/12    Sleep apnea    Syncope, non cardiac    Past Surgical History:  Procedure Laterality Date   ARTHROSCOPIC HAGLUNDS REPAIR     BREAST CYST EXCISION Right 11/21/2012   Procedure: CYST EXCISION BREAST;  Surgeon: Currie Paris, MD;  Location: [facility];  Service: General;  Laterality: Right;   BREAST CYST EXCISION Bilateral 01/18/2022   Procedure: EXCISION OF SEBACEOUS CYST BILATERAL BREAST;  Surgeon: Harriette Bouillon, MD;  Location: [facility];  Service: General;  Laterality: Bilateral;   BREAST EXCISIONAL BIOPSY Right 11/2012   CESAREAN SECTION  04/19/1993   CHOLECYSTECTOMY      COLONOSCOPY  05/02/2011   Procedure: COLONOSCOPY;  Surgeon: Vertell Novak., MD;  Location: [facility];  Service: Endoscopy;  Laterality: N/A;   colonoscopy  11/04/2014   DENTAL SURGERY  03/06/2018   2 surgeries ( 03/06/2018 and 04/06/2018)   to remove two separate benign tumors   ESOPHAGEAL MANOMETRY N/A 11/04/2015   Procedure: ESOPHAGEAL MANOMETRY (EM);  Surgeon: Carman Ching, MD;  Location: [facility];  Service: Endoscopy;  Laterality: N/A;   ESOPHAGOGASTRODUODENOSCOPY  05/02/2011   Procedure: ESOPHAGOGASTRODUODENOSCOPY (EGD);  Surgeon: Vertell Novak., MD;  Location: [facility];  Service: Endoscopy;  Laterality: N/A;   ESOPHAGOGASTRODUODENOSCOPY N/A 09/01/2014   Procedure: ESOPHAGOGASTRODUODENOSCOPY (EGD);  Surgeon: Carman Ching, MD;  Location: [facility];  Service: Endoscopy;  Laterality: N/A;   KNEE SURGERY     LAPAROSCOPY     x 4   LESION REMOVAL N/A 01/18/2022   Procedure: EXCISION OF SEBACEOUS CYSTS CHEST AND NECK;  Surgeon: Harriette Bouillon, MD;  Location: [facility];  Service: General;  Laterality: N/A;   PH IMPEDANCE STUDY N/A 11/04/2015   Procedure: PH IMPEDANCE STUDY;  Surgeon: Carman Ching, MD;  Location: [facility];  Service: Endoscopy;  Laterality: N/A;   VAGINAL HYSTERECTOMY  2010   TVH--ovaries remain  Patient Active Problem List   Diagnosis Date Noted   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   Elevated LFTs 04/04/2021   Colitis 04/04/2021   Bacterial overgrowth syndrome 05/21/2020   Obesity 05/21/2020   History of endometriosis 05/21/2020   Constipation due to outlet dysfunction 02/05/2020   Gastroesophageal reflux disease 02/05/2020   Obstructive sleep apnea treated with continuous positive airway pressure (CPAP) 06/16/2017   Perennial allergic rhinitis with a nonallergic component 01/31/2017   Asthmatic bronchitis 01/31/2017   GI symptoms 01/31/2017   Family history of colon cancer 01/27/2015   Chest pain 12/13/2012   Dyspnea  12/13/2012   DVT of lower extremity (deep venous thrombosis) (HCC) 01/03/1990    PCP: Lorenda Ishihara, MD  REFERRING PROVIDER: Jerene Bears, MD   REFERRING DIAG: M62.89 (ICD-10-CM) - Pelvic floor dysfunction  THERAPY DIAG:  Pelvic pain  Other low back pain  Muscle weakness (generalized)  Other muscle spasm  Unspecified lack of coordination  Rationale for Evaluation and Treatment: Rehabilitation  ONSET DATE: chronic  SUBJECTIVE:                                                                                                                                                                                           SUBJECTIVE STATEMENT: Pt states that vaginal irritation is getting worse. She is seeing dermatologist. She was just instructed to perform another cleanse even though she is having constant diarrhea.   PAIN:  Are you having pain? Yes NPRS scale: 3/10 Pain location:  Lower abdominal pain, Lt sided pain, low back pain  Pain type: turning, knotted, pressure Pain description: intermittent and constant   Aggravating factors: constipation or frequent bowel movements/constantly having bowel movements  Relieving factors: exercises (bowel massage, happy baby, spinal twists, walking)  PRECAUTIONS: None  WEIGHT BEARING RESTRICTIONS: No  FALLS:  Has patient fallen in last 6 months? No  LIVING ENVIRONMENT: Lives with: lives with their spouse Lives in: House/apartment  OCCUPATION: nurse, in admin  PLOF: Independent  PATIENT GOALS: decrease pain and have more normal bowel movements  PERTINENT HISTORY:  Vaginal hysterectomy, DVT LE, endometriosis, interstitial cystitis, exploratory laps for endometriosis, c-section, cholecystectomy Sexual abuse: No  BOWEL MOVEMENT: Pain with bowel movement: Yes Type of bowel movement:Type (Bristol Stool Scale) 1-7 (sometimes full range in the same bowel movement), Frequency sometimes many times a day, sometimes up to 4  days in between, and Strain Yes Fully empty rectum: No Leakage: No Pads: No Fiber supplement: No - has attempted metamucil and it made constipation worse  URINATION: Pain with urination: Yes Fully empty bladder: No Stream:  varies  Urgency: Yes: all the time Frequency:  multiple times an hours  Leakage: Urge to void, Walking to the bathroom, Coughing, Sneezing, Laughing, and Exercise Pads: Yes: daily  INTERCOURSE: Pain with intercourse: Initial Penetration and During Penetration Ability to have vaginal penetration:  Yes: with pain Climax: non painful WNL   PREGNANCY: Vaginal deliveries 1 Tearing Yes: 3rd degree tear C-section deliveries 1 Currently pregnant No  PROLAPSE: Periodically will feel vaginal bulging, heaviness in lower abdomen   OBJECTIVE:  11/09/22: No internal rectal or vaginal pelvic floor exam performed due to high level of skin irritation   Weak transversus abdominus contraction  Abdominal restriction and tenderness in bil lower quadrant  Unable to perform pelvic tilt with appropriate coordination  LUMBARAROM/PROM:  A/PROM A/PROM  Eval (% available) 11/09/22 (% available)  Flexion 50 90  Extension 25, pain anteriorly  25, pain in low back  Right lateral flexion 50, pain anteriorly  75, pain on right  Left lateral flexion 50, pain anteriorly  75, pain on right   (Blank rows = not tested) 07/21/22: standing prolapse assessment demonstrated grade 2 anterior vaginal wall laxity   06/08/22:               External Perineal Exam: WNL                              Internal Pelvic Floor: burning reported with palpation of superficial muscles; deep aching/pressure with palpation of Lt levator ani   Patient confirms identification and approves PT to assess internal pelvic floor and treatment Yes  PELVIC MMT:   MMT eval  Vaginal 3/5  Internal Anal Sphincter 1/5  External Anal Sphincter 2/5  Puborectalis 1/5  Diastasis Recti   (Blank rows = not tested)         TONE: High in Lt levator ani   PROLAPSE: Not able to tell this session due to dyssynergic pelvic floor contraction     06/01/22: COGNITION: Overall cognitive status: Within functional limits for tasks assessed     SENSATION: Light touch: Appears intact Proprioception: Appears intact  GAIT: Comments: decreased hip extension, forward flexed trunk  POSTURE: rounded shoulders, forward head, decreased lumbar lordosis, increased thoracic kyphosis, posterior pelvic tilt, and flexed trunk    LUMBARAROM/PROM:  A/PROM A/PROM  Eval (% available)  Flexion 50  Extension 25, pain anteriorly   Right lateral flexion 50, pain anteriorly   Left lateral flexion 50, pain anteriorly    (Blank rows = not tested)   PALPATION:   General  Significant abdominal restriction and tenderness; decreased rib mobility with mobilization and breathing    TODAY'S TREATMENT:                                                                                                                              DATE:  11/14/22 Neuromuscular re-education: Seated pelvic tilts 2 x 10 Seated hip adduction ball squeeze with pelvic floor muscle and transversus  abdominus 2 x 10 Seated hip abduction with yellow band, pelvic floor muscle and transversus abdominus 2 x 10 Seated resisted march 2 x 10 yellow band with pelvic floor muscle and transversus abdominus 2 x 10 Seated horizontal abduction/extension 2 x 10 green band  Exercises: Seated forward fold 10 breaths  Seated piriformis stretch 2 min bil Kneeling hip flexor stretch 2 minutes bil  Seated wide leg trunk rotation/adductor stretch 2 x 10  RE-EVAL 11/09/22 Neuromuscular re-education: Transversus abdominus training with multimodal cues for improved motor control and breath coordination  Supine pelvic tilts on dynadisc 2 x 10 Supine hip adduction with transversus abdominus activation on dynadisc 2 x 10 Supine UE ball press with transversus abdominus 2 x  10 Supine march on dynadisc 2 x 10 Supine bridge with adduction and transversus abdominus 2 x 10 Exercises: Seated lateral flexion 10x bil Seated forward fold 10 breaths Lower trunk rotation 2 x 10   11/01/22 Manual: Bowel mobilization  Neuromuscular re-education: Swiss ball activities: Circles 20x bil Lateral glides 20x bil Pelvic tilts 2 x 10 Pelvic floor bulge 10x Pelvic floor muscle contraction training with external feedback at perineum in supine  Push training with appropriate breathing and external tactile cues at coccyx in sidelying  Modified cat cow on table 10x  PATIENT EDUCATION:  Education details: See above Person educated: Patient Education method: Programmer, multimedia, Demonstration, Tactile cues, Verbal cues, and Handouts Education comprehension: verbalized understanding  HOME EXERCISE PROGRAM: [id number]  ASSESSMENT:  CLINICAL IMPRESSION: Pt reporting no change, but has been focusing on a little more strengthening in her HEP. She feels like working with breathing does help bowel movements. She has a stool burden again and has not stopped having diarrhea since her last cleanse less than a month ago; MD is still suspecting that it is just overflow diarrhea. She did well with all exercises today and mobility activities. 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 Hopefully core strengthening and mobility exercises will help with this. She will continue to benefit from skilled PT intervention in order to decrease pain, improve bowel movements, and decrease urinary urgency/incontinence.     OBJECTIVE IMPAIRMENTS: decreased activity tolerance, decreased coordination, decreased endurance, decreased mobility, decreased strength, increased fascial restrictions, increased muscle spasms, impaired tone, postural dysfunction, and pain.   ACTIVITY LIMITATIONS: lifting, bending, continence, and locomotion  level  PARTICIPATION LIMITATIONS: interpersonal relationship, community activity, and occupation  PERSONAL FACTORS: 1 comorbidity: medical history  are also affecting patient's functional outcome.   REHAB POTENTIAL: Good  CLINICAL DECISION MAKING: Stable/uncomplicated  EVALUATION COMPLEXITY: Low   GOALS: Goals reviewed with patient? Yes  SHORT TERM GOALS: Target date: 07/06/22 - updated 07/12/22 - updated 08/18/22 - updated 09/22/22 - updated 10/26/22 - updated 11/09/22  Pt will be independent with HEP.   Baseline: Goal status: MET 07/12/22  2.  Pt will be independent with diaphragmatic breathing and down training activities in order to improve pelvic floor relaxation.  Baseline:  Goal status: MET 07/12/22  3.  Pt will be independent with the knack, urge suppression technique, and double voiding in order to improve bladder habits and decrease urinary incontinence.   Baseline:  Goal status: MET 09/22/22  4.  Pt will be independent with use of squatty potty, relaxed toileting mechanics, and improved bowel movement techniques in order to increase ease of bowel movements and complete evacuation.   Baseline:  Goal status: MET 11/09/22  5.  Pt  will be able to correctly perform diaphragmatic breathing and appropriate pressure management in order to prevent worsening vaginal wall laxity and improve pelvic floor A/ROM.   Baseline:  Goal status: IN PROGRESS  LONG TERM GOALS: Target date: 11/16/2022 - updated 07/12/22 - updated 08/18/22 - updated 09/22/22 - updated 10/26/22 - updated 11/09/22  Pt will be independent with advanced HEP.   Baseline:  Goal status: IN PROGRESS  2.  Pt will demonstrate normal pelvic floor muscle tone and A/ROM, able to achieve 4/5 strength with contractions and 10 sec endurance, in order to provide appropriate lumbopelvic support in functional activities.   Baseline:  Goal status: IN PROGRESS  3.  Pt will increase all impaired lumbar A/ROM by 25% without  pain.  Baseline:  Goal status:  IN PROGRESS  4.  Pt will report pain no higher than 3/10 with any activity. Baseline:  Goal status:  IN PROGRESS  5.  Pt will report 0/10 pain with vaginal penetration in order to improve intimate relationship with partner.    Baseline:  Goal status:  IN PROGRESS  6.  Pt will be able to go 2-3 hours in between voids without urgency or incontinence in order to improve QOL and perform all functional activities with less difficulty.   Baseline:  Goal status: IN PROGRESS  7.  Pt will report no leaks with laughing, coughing, sneezing in order to improve comfort with interpersonal relationships and community activities.   Baseline:  Goal status:  IN PROGRESS  8.  Pt will have consistent bowel movements 4-5x/week without straining or pain.  Baseline:  Goal status:  IN PROGRESS  PLAN:  PT FREQUENCY: 1-2x/week  PT DURATION: 6 months  PLANNED INTERVENTIONS: Therapeutic exercises, Therapeutic activity, Neuromuscular re-education, Balance training, Gait training, Patient/Family education, Self Care, Joint mobilization, Dry Needling, Biofeedback, and Manual therapy  PLAN FOR NEXT SESSION: Continue to work on motor control activities surrounding pelvis; progress mobility; manual techniques as needed for comfort and to increase circulation.   Julio Alm, PT, DPT11/11/241:09 PM

## 2022-11-15 ENCOUNTER — Ambulatory Visit: Payer: Commercial Managed Care - PPO | Admitting: Occupational Therapy

## 2022-11-15 ENCOUNTER — Other Ambulatory Visit (HOSPITAL_COMMUNITY): Payer: Self-pay

## 2022-11-15 DIAGNOSIS — I890 Lymphedema, not elsewhere classified: Secondary | ICD-10-CM | POA: Diagnosis not present

## 2022-11-15 NOTE — Therapy (Unsigned)
OUTPATIENT OCCUPATIONAL THERAPY TREATMENT NOTE  LOWER EXTREMITY LYMPHEDEMA  Patient Name: Karen [name] MRN: [id number] DOB:1970-08-31, 52 y.o., female Today's Date: 11/16/2022  END OF SESSION:   OT End of Session - 11/15/22 1507     Visit Number 2    Number of Visits 36    Date for OT Re-Evaluation 02/08/23    OT Start Time 0303    OT Stop Time 0410    OT Time Calculation (min) 67 min    Activity Tolerance Patient tolerated treatment well;No increased pain    Behavior During Therapy [facility] for tasks assessed/performed               Past Medical History:  Diagnosis Date   Adenomyosis    Anemia    Arthritis 2013   L knee gets steroid injections    Asthmatic bronchitis 01/31/2017   Back pain    Chronic constipation    Diarrhea    DVT of lower extremity (deep venous thrombosis) (HCC)    Dysmenorrhea    Endometriosis    Esophageal stricture    GERD (gastroesophageal reflux disease)    History of kidney stones    History of uterine fibroid    IBS (irritable bowel syndrome)    Interstitial cystitis    Lactose intolerance    Migraine    with aura   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   PONV (postoperative nausea and vomiting)    Recurrent upper respiratory infection (URI)    Sebaceous cyst of breast, right lower inner quadrant 10/25/2012   Excised 11/21/12    Sleep apnea    Syncope, non cardiac    Past Surgical History:  Procedure Laterality Date   ARTHROSCOPIC HAGLUNDS REPAIR     BREAST CYST EXCISION Right 11/21/2012   Procedure: CYST EXCISION BREAST;  Surgeon: Currie Paris, MD;  Location: [facility];  Service: General;  Laterality: Right;   BREAST CYST EXCISION Bilateral 01/18/2022   Procedure: EXCISION OF SEBACEOUS CYST BILATERAL BREAST;  Surgeon: Harriette Bouillon, MD;  Location: [facility];  Service: General;  Laterality: Bilateral;   BREAST EXCISIONAL BIOPSY Right 11/2012   CESAREAN SECTION  04/19/1993   CHOLECYSTECTOMY      COLONOSCOPY  05/02/2011   Procedure: COLONOSCOPY;  Surgeon: Vertell Novak., MD;  Location: [facility];  Service: Endoscopy;  Laterality: N/A;   colonoscopy  11/04/2014   DENTAL SURGERY  03/06/2018   2 surgeries ( 03/06/2018 and 04/06/2018)   to remove two separate benign tumors   ESOPHAGEAL MANOMETRY N/A 11/04/2015   Procedure: ESOPHAGEAL MANOMETRY (EM);  Surgeon: Carman Ching, MD;  Location: [facility];  Service: Endoscopy;  Laterality: N/A;   ESOPHAGOGASTRODUODENOSCOPY  05/02/2011   Procedure: ESOPHAGOGASTRODUODENOSCOPY (EGD);  Surgeon: Vertell Novak., MD;  Location: [facility];  Service: Endoscopy;  Laterality: N/A;   ESOPHAGOGASTRODUODENOSCOPY N/A 09/01/2014   Procedure: ESOPHAGOGASTRODUODENOSCOPY (EGD);  Surgeon: Carman Ching, MD;  Location: [facility];  Service: Endoscopy;  Laterality: N/A;   KNEE SURGERY     LAPAROSCOPY     x 4   LESION REMOVAL N/A 01/18/2022   Procedure: EXCISION OF SEBACEOUS CYSTS CHEST AND NECK;  Surgeon: Harriette Bouillon, MD;  Location:  SURGERY CENTER;  Service: General;  Laterality: N/A;   PH IMPEDANCE STUDY N/A 11/04/2015   Procedure: PH IMPEDANCE STUDY;  Surgeon: Carman Ching, MD;  Location: [facility];  Service: Endoscopy;  Laterality: N/A;   VAGINAL HYSTERECTOMY  2010   TVH--ovaries remain   Patient  Active Problem List   Diagnosis Date Noted   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   Elevated LFTs 04/04/2021   Colitis 04/04/2021   Bacterial overgrowth syndrome 05/21/2020   Obesity 05/21/2020   History of endometriosis 05/21/2020   Constipation due to outlet dysfunction 02/05/2020   Gastroesophageal reflux disease 02/05/2020   Obstructive sleep apnea treated with continuous positive airway pressure (CPAP) 06/16/2017   Perennial allergic rhinitis with a nonallergic component 01/31/2017   Asthmatic bronchitis 01/31/2017   GI symptoms 01/31/2017   Family history of colon cancer 01/27/2015   Chest pain 12/13/2012   Dyspnea  12/13/2012   DVT of lower extremity (deep venous thrombosis) (HCC) 01/03/1990    PCP: Coral Else, MD  REFERRING PROVIDER: Lorenda Ishihara, MD  REFERRING DIAG: I89.0  THERAPY DIAG:  Lymphedema, not elsewhere classified  Rationale for Evaluation and Treatment: Rehabilitation  ONSET DATE: ~2014; exacerbation in 2023  SUBJECTIVE:                                                                                                                                                                                           SUBJECTIVE STATEMENT: Katianne returns to OT for Rx visit 1 of lymphedema care. Im really uncomfortable right now because of my digestive system. Pt has no new  lymphedema related  complaints. She denies LE-related pain.  PERTINENT HISTORY: CVI, OSA (no CPAP)  PAIN:  Are you having pain? Yes: NPRS scale: not rated/10 Pain location: BLE Pain description: tight, heavy, tingling, numbness Aggravating factors: standing, walking, dependent sitting > 30 minutes Relieving factors: elevation  PRECAUTIONS: Other: LYMPHEDEMA; Hx LLE DVT  RED FLAGS: Hx LLE DVT  WEIGHT BEARING RESTRICTIONS: No  FALLS:  Has patient fallen in last 6 months? No  LIVING ENVIRONMENT: Lives with: lives with spouse and daughter Lives in: House/apartment Stairs: Yes; Internal: 14 steps; yes and External: 4 steps; yes Has following equipment at home: None  OCCUPATION: [employer] operational officer, walking, desk work  LEISURE: family time  HAND DOMINANCE: right   PRIOR LEVEL OF FUNCTION: Independent  PATIENT GOALS: Feel better, be able to be more active without pain, wear preferred street shoes  OBJECTIVE: Note: Objective measures were completed at Evaluation unless otherwise noted.  COGNITION:  Overall cognitive status: Within functional limits for tasks assessed   OBSERVATIONS / OTHER ASSESSMENTS:   POSTURE: WFL  LE ROM: WFL  LE MMT: WFL  LYMPHEDEMA ASSESSMENTS: non-Ca  related  INFECTIONS: denies cellulitis and wound hx  BLE COMPARATIVE LIMB VOLUMETRICS Initial 11/15/22  LANDMARK RIGHT (dominant)  R LEG (A-D) 3028.9 ml  R THIGH (E-G) 4809.60 ml  R FULL LIMB (A-G) 7838.5  ml  Limb Volume differential (LVD)  %  Volume change since initial %  Volume change overall V  (Blank rows = not tested)  LANDMARK LEFT  (Rx)  L LEG (A-D) 3189.9 ml  L THIGH (E-G) 4959.8 ml  L FULL LIMB (A-G) 8149.7 ml  Limb Volume differential (LVD)  LEG LVD = 5.32%, L>R; THIGH LVD = 3.12%, L>R; And full LLE LVD = 3.97%, L>R. 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 Obesity  Skin  Description Hyper-Keratosis Peau d' Orange Shiny Tight Fibrotic/ Indurated Fatty Doughy Spongy/ boggy       R>L x  x   Skin dry Flaky WNL Macerated   mildly      Color Redness Varicosities Blanching Hemosiderin Stain Mottled        x   Odor Malodorous Yeast Fungal infection  WNL      x   Temperature Warm Cool wnl     x    Pitting Edema   1+ 2+ 3+ 4+ Non-pitting         x   Girth Symmetrical Asymmetrical                   Distribution    L>R toes to groin    Stemmer Sign Positive Negative   +L -R   Lymphorrhea History Of:  Present Absent     x    Wounds History Of Present Absent Venous Arterial Pressure Sheer     x        Signs of Infection Redness Warmth Erythema Acute Swelling Drainage Borders                    Sensation Light Touch Deep pressure Hypersensitivity   In tact Impaired In tact Impaired Absent Impaired   x  x  x     Nails WNL   Fungus nail dystrophy   x     Hair Growth Symmetrical Asymmetrical   x    Skin Creases Base of toes  Ankles   Base of Fingers knees       Abdominal pannus Thigh Lobules  Face/neck   x           GAIT: Distance walked: >500' Assistive device utilized: None Level of assistance: Complete  Independence Comments: Functional ambulation and transfers [facility]  LYMPHEDEMA LIFE IMPACT SCALE (LLIS): Intitial 11/15/22  50%  FOTO functional outcome measure: TBA initial Rx visit 2/2 time constraints  TODAY'S TREATMENT:                                                                                                                                         BLE comparative limb volumetrics Pt edu   PATIENT EDUCATION:  Education details: Provided basic  level education regarding lymphatic structure and function, etiology, onset patterns, stages of progression, and prevention to limit infection risk, worsening condition and further functional decline. Pt edu for aught interaction between blood circulatory system and lymphatic circulation.Discussed  impact of gravity and co-morbidities on lymphatic function. Outlined Complete Decongestive Therapy (CDT)  as standard of care and provided in depth information regarding 4 primary components of Intensive and Self Management Phases, including Manual Lymph Drainage (MLD), compression wrapping and garments, skin care, and therapeutic exercise. Homero Fellers discussion with re need for frequent attendance and high burden of care when caregiver is needed, impact of co morbidities. We discussed  the chronic, progressive nature of lymphedema and Importance of daily, ongoing LE self-care essential for limiting progression and infection risk.  Person educated: Patient Education method: Explanation, Demonstration, and Handouts Education comprehension: verbalized understanding, returned demonstration, and needs further education  HOME EXERCISE PROGRAM: BLE lymphatic pumping there ex using- 1 sets of 10 reps, each exercise in order-  1-2 x daily, bilaterally Simple self MLD 1 x daily Daily skin care to increase hydration, skin mobility and decrease infection risk- can be done during MLD Compression wraps 23/7 during intensive phase of CDT Compression garments/ devices during  self-management phase of CDT  ASSESSMENT:  CLINICAL IMPRESSION: While their is no normative data on limb volume differentials we can surmise that volume differences under 3-5% are often very difficult to identify by eye. In this case P presents with very love LVDs, revealing leg, thighs and full lower extremities are very symmetrical in volume. Interestingly though, the limb volumes are all greater in the non-dominant  LLE than in segments of the dominant RLE. To the trained eye the leg appears slightly larger on the left and tissue swelling is palpable, although very minimal. Pt is in agreement with plan to skip compression wrapping and move directly to compression garment fitting. Next session we will discuss options and recommendations in detail. Cont as per POC.   11/15/22 Initial Evaluation: Zemora Gmerek is a 52 y.o. female presenting with very mild, stage II, LLE lymphedema 2/2 venous insufficiency and obesity. L Leg swelling and associated pain swelling fluctuates. It has progressed over time, and now no longer resolves with elevation over night. RLE swelling limits balance during functional ambulation. It exacerbates infection and falls risk. LLE/RLQ lymphedema interferes with functional performance in all occupational domains, including basic and instrumental ADLs, productive activities, leisure pursuits and social participation. Pt will benefit from Occupational Therapy for Complete Decongestive Therapy (CDT) to restore function, to reduce physical and psychologic suffering associated with chronic, progressive lymphedema and associated pain, and to limit infection. CDT will include manual lymphatic drainage (MLD), skin care, therapeutic exercise and compression wraps and garment's devices. Without skilled OT for lymphedema care the condition will worsen and further functional decline is expected  Custom-made gradient compression garments and HOS devices are medically necessary because they are  uniquely sized and shaped to fit the exact dimensions of the affected extremities, and to provide appropriate medical grade, graduated compression essential for optimally managing chronic, progressive lymphedema. Multiple custom compression garments are needed to ensure proper hygiene to limit infection risk. Custom compression garments should be replaced q 3-6 months When worn consistently for optimal lipo-lymphedema self-management over time. HOS devices, medically necessary to limit fibrosis buildup in tissue, should be replaced q 2 years and PRN when worn out.      OBJECTIVE IMPAIRMENTS: decreased activity tolerance, decreased knowledge of condition, decreased knowledge of use of DME, increased  edema, impaired sensation, pain, and chronic, progressive leg swelling .   ACTIVITY LIMITATIONS: dependent sitting, extended standing and/ or walking, squatting, and lower body dressing, fitting preferred street shoes  PARTICIPATION LIMITATIONS: shopping, community activity, occupation, and yard work  PERSONAL FACTORS: 3+ comorbidities: Hx DVT,  OSA, CVI. Varicose veins  are also affecting patient's functional outcome.   REHAB POTENTIAL: Good  EVALUATION COMPLEXITY: Moderate   GOALS: Goals reviewed with patient? Yes  SHORT TERM GOALS: Target date: 4th OT Rx visit  SHORT TERM GOALS: Target date: 4th OT Rx visit   Pt will demonstrate understanding of lymphedema precautions and prevention strategies with modified independence using a printed reference to identify at least 5 precautions and discussing how s/he may implement them into daily life to reduce risk of progression with extra time. Baseline:Max A Goal status: INITIAL   Pt will be able to apply multilayer, knee length, gradient, compression wraps to one leg at a time with modified assistance (extra time and assistive device/s) to decrease limb volume, to limit infection risk, and to limit lymphedema progression.  Baseline: Dependent Goal  status: INITIAL  LONG TERM GOALS: Target date: 02/08/23  Given this patient's Intake score of TBA /100% on the functional outcomes FOTO tool, patient will experience an increase in function of 5 points to improve basic and instrumental ADLs performance, including lymphedema self-care.  Baseline: Max A Goal status: INITIAL   Given this patient's Intake score of TBA% on the Lymphedema Life Impact Scale (LLIS), patient will experience a reduction of at least 5 points in her perceived level of functional impairment resulting from lymphedema to improve functional performance and quality of life (QOL). Baseline: 64.71% Goal status: INITIAL  3 Pt will achieve at least a 10% volume reduction in B legs to return limb to typical size and shape, to limit infection risk and LE progression, to decrease pain, to improve function. Baseline: Dependent Goal status: INITIAL  4.  Pt will obtain proper compression garments/devices and achieve modified independence (extra time + assistive devices) with donning/doffing to optimize limb volume reductions and limit LE  progression over time. Baseline:  Goal status: INITIAL  5.  During Intensive phase CDT , with modified independence, Pt will achieve at least 85% compliance with all lymphedema self-care home program components, including daily skin care, compression wraps and /or garments, simple self MLD and lymphatic pumping therex to habituate LE self care protocol  into ADLs for optimal LE self-management over time. Baseline: Dependent Goal status: INITIAL   PLAN:  PT FREQUENCY: 2x/week  PT DURATION: 12 weeks  PLANNED INTERVENTIONS: 97110-Therapeutic exercises, 97530- Therapeutic activity, 97535- Self Care, [id number]- Manual therapy, Patient/Family education, Manual lymph drainage, Compression bandaging, and fit with appropriate compression garments  PLAN FOR NEXT SESSION:  BLE comparative limb volumetrics Pt edu for LE self care  Loel Dubonnet, MS,  OTR/L, CLT-LANA 11/16/22 1:44 PM '

## 2022-11-16 ENCOUNTER — Encounter: Payer: Self-pay | Admitting: Occupational Therapy

## 2022-11-16 ENCOUNTER — Ambulatory Visit: Payer: Commercial Managed Care - PPO | Admitting: Occupational Therapy

## 2022-11-17 ENCOUNTER — Other Ambulatory Visit (HOSPITAL_COMMUNITY): Payer: Self-pay

## 2022-11-17 ENCOUNTER — Ambulatory Visit (HOSPITAL_COMMUNITY)
Admission: RE | Admit: 2022-11-17 | Discharge: 2022-11-17 | Disposition: A | Discharge status: Home / Self Care | Payer: Commercial Managed Care - PPO | Source: Ambulatory Visit | Attending: Neurology | Admitting: Neurology

## 2022-11-17 DIAGNOSIS — R634 Abnormal weight loss: Secondary | ICD-10-CM | POA: Insufficient documentation

## 2022-11-17 DIAGNOSIS — Z789 Other specified health status: Secondary | ICD-10-CM | POA: Diagnosis not present

## 2022-11-17 DIAGNOSIS — R29818 Other symptoms and signs involving the nervous system: Secondary | ICD-10-CM | POA: Insufficient documentation

## 2022-11-17 DIAGNOSIS — G4733 Obstructive sleep apnea (adult) (pediatric): Secondary | ICD-10-CM | POA: Insufficient documentation

## 2022-11-17 DIAGNOSIS — R350 Frequency of micturition: Secondary | ICD-10-CM | POA: Diagnosis not present

## 2022-11-17 DIAGNOSIS — F439 Reaction to severe stress, unspecified: Secondary | ICD-10-CM | POA: Insufficient documentation

## 2022-11-17 DIAGNOSIS — G43909 Migraine, unspecified, not intractable, without status migrainosus: Secondary | ICD-10-CM | POA: Insufficient documentation

## 2022-11-17 DIAGNOSIS — G5712 Meralgia paresthetica, left lower limb: Secondary | ICD-10-CM | POA: Insufficient documentation

## 2022-11-17 NOTE — Progress Notes (Signed)
Carotid artery duplex has been completed. Preliminary results can be found in CV Proc through chart review.   11/17/22 3:12 PM Olen Cordial RVT

## 2022-11-22 ENCOUNTER — Other Ambulatory Visit (HOSPITAL_COMMUNITY): Payer: Self-pay

## 2022-11-23 ENCOUNTER — Other Ambulatory Visit (HOSPITAL_COMMUNITY): Payer: Self-pay

## 2022-11-23 ENCOUNTER — Ambulatory Visit: Payer: Commercial Managed Care - PPO

## 2022-11-23 DIAGNOSIS — R279 Unspecified lack of coordination: Secondary | ICD-10-CM

## 2022-11-23 DIAGNOSIS — M5459 Other low back pain: Secondary | ICD-10-CM

## 2022-11-23 DIAGNOSIS — M62838 Other muscle spasm: Secondary | ICD-10-CM | POA: Diagnosis not present

## 2022-11-23 DIAGNOSIS — M6281 Muscle weakness (generalized): Secondary | ICD-10-CM | POA: Diagnosis not present

## 2022-11-23 DIAGNOSIS — R102 Pelvic and perineal pain: Secondary | ICD-10-CM

## 2022-11-23 NOTE — Therapy (Signed)
OUTPATIENT PHYSICAL THERAPY FEMALE PELVIC TREATMENT   Patient Name: Karen [name] MRN: [id number] DOB:1970-10-30, 52 y.o., female Today's Date: 11/23/2022  END OF SESSION:  PT End of Session - 11/23/22 1029     Visit Number 22    Date for PT Re-Evaluation 04/26/23    Authorization Type [employer]    PT Start Time 1029    PT Stop Time 1058    PT Time Calculation (min) 29 min    Activity Tolerance Patient tolerated treatment well    Behavior During Therapy [facility] for tasks assessed/performed                     Past Medical History:  Diagnosis Date   Adenomyosis    Anemia    Arthritis 2013   L knee gets steroid injections    Asthmatic bronchitis 01/31/2017   Back pain    Chronic constipation    Diarrhea    DVT of lower extremity (deep venous thrombosis) (HCC)    Dysmenorrhea    Endometriosis    Esophageal stricture    GERD (gastroesophageal reflux disease)    History of kidney stones    History of uterine fibroid    IBS (irritable bowel syndrome)    Interstitial cystitis    Lactose intolerance    Migraine    with aura   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   PONV (postoperative nausea and vomiting)    Recurrent upper respiratory infection (URI)    Sebaceous cyst of breast, right lower inner quadrant 10/25/2012   Excised 11/21/12    Sleep apnea    Syncope, non cardiac    Past Surgical History:  Procedure Laterality Date   ARTHROSCOPIC HAGLUNDS REPAIR     BREAST CYST EXCISION Right 11/21/2012   Procedure: CYST EXCISION BREAST;  Surgeon: Currie Paris, MD;  Location: [facility];  Service: General;  Laterality: Right;   BREAST CYST EXCISION Bilateral 01/18/2022   Procedure: EXCISION OF SEBACEOUS CYST BILATERAL BREAST;  Surgeon: Harriette Bouillon, MD;  Location: [facility];  Service: General;  Laterality: Bilateral;   BREAST EXCISIONAL BIOPSY Right 11/2012   CESAREAN SECTION  04/19/1993   CHOLECYSTECTOMY      COLONOSCOPY  05/02/2011   Procedure: COLONOSCOPY;  Surgeon: Vertell Novak., MD;  Location: [facility];  Service: Endoscopy;  Laterality: N/A;   colonoscopy  11/04/2014   DENTAL SURGERY  03/06/2018   2 surgeries ( 03/06/2018 and 04/06/2018)   to remove two separate benign tumors   ESOPHAGEAL MANOMETRY N/A 11/04/2015   Procedure: ESOPHAGEAL MANOMETRY (EM);  Surgeon: Carman Ching, MD;  Location: [facility];  Service: Endoscopy;  Laterality: N/A;   ESOPHAGOGASTRODUODENOSCOPY  05/02/2011   Procedure: ESOPHAGOGASTRODUODENOSCOPY (EGD);  Surgeon: Vertell Novak., MD;  Location: [facility];  Service: Endoscopy;  Laterality: N/A;   ESOPHAGOGASTRODUODENOSCOPY N/A 09/01/2014   Procedure: ESOPHAGOGASTRODUODENOSCOPY (EGD);  Surgeon: Carman Ching, MD;  Location: [facility];  Service: Endoscopy;  Laterality: N/A;   KNEE SURGERY     LAPAROSCOPY     x 4   LESION REMOVAL N/A 01/18/2022   Procedure: EXCISION OF SEBACEOUS CYSTS CHEST AND NECK;  Surgeon: Harriette Bouillon, MD;  Location: [facility];  Service: General;  Laterality: N/A;   PH IMPEDANCE STUDY N/A 11/04/2015   Procedure: PH IMPEDANCE STUDY;  Surgeon: Carman Ching, MD;  Location: [facility];  Service: Endoscopy;  Laterality: N/A;   VAGINAL HYSTERECTOMY  2010   TVH--ovaries remain  Patient Active Problem List   Diagnosis Date Noted   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   Elevated LFTs 04/04/2021   Colitis 04/04/2021   Bacterial overgrowth syndrome 05/21/2020   Obesity 05/21/2020   History of endometriosis 05/21/2020   Constipation due to outlet dysfunction 02/05/2020   Gastroesophageal reflux disease 02/05/2020   Obstructive sleep apnea treated with continuous positive airway pressure (CPAP) 06/16/2017   Perennial allergic rhinitis with a nonallergic component 01/31/2017   Asthmatic bronchitis 01/31/2017   GI symptoms 01/31/2017   Family history of colon cancer 01/27/2015   Chest pain 12/13/2012   Dyspnea  12/13/2012   DVT of lower extremity (deep venous thrombosis) (HCC) 01/03/1990    PCP: Lorenda Ishihara, MD  REFERRING PROVIDER: Jerene Bears, MD   REFERRING DIAG: M62.89 (ICD-10-CM) - Pelvic floor dysfunction  THERAPY DIAG:  Pelvic pain  Other low back pain  Muscle weakness (generalized)  Other muscle spasm  Unspecified lack of coordination  Rationale for Evaluation and Treatment: Rehabilitation  ONSET DATE: chronic  SUBJECTIVE:                                                                                                                                                                                           SUBJECTIVE STATEMENT: Pt states that she did cleanse Sunday and has had even more perineal irritation due to frequent bowel movements and diarrhea. She saw urologist and was encouraged to continue pelvic floor PT. She is having more issues with bladder control.   PAIN:  Are you having pain? Yes NPRS scale: 3/10 Pain location:  Lower abdominal pain, Lt sided pain, low back pain  Pain type: turning, knotted, pressure Pain description: intermittent and constant   Aggravating factors: constipation or frequent bowel movements/constantly having bowel movements  Relieving factors: exercises (bowel massage, happy baby, spinal twists, walking)  PRECAUTIONS: None  WEIGHT BEARING RESTRICTIONS: No  FALLS:  Has patient fallen in last 6 months? No  LIVING ENVIRONMENT: Lives with: lives with their spouse Lives in: House/apartment  OCCUPATION: nurse, in admin  PLOF: Independent  PATIENT GOALS: decrease pain and have more normal bowel movements  PERTINENT HISTORY:  Vaginal hysterectomy, DVT LE, endometriosis, interstitial cystitis, exploratory laps for endometriosis, c-section, cholecystectomy Sexual abuse: No  BOWEL MOVEMENT: Pain with bowel movement: Yes Type of bowel movement:Type (Bristol Stool Scale) 1-7 (sometimes full range in the same bowel  movement), Frequency sometimes many times a day, sometimes up to 4 days in between, and Strain Yes Fully empty rectum: No Leakage: No Pads: No Fiber supplement: No - has attempted metamucil and it made constipation worse  URINATION: Pain with urination: Yes Fully  empty bladder: No Stream:  varies  Urgency: Yes: all the time Frequency: multiple times an hours  Leakage: Urge to void, Walking to the bathroom, Coughing, Sneezing, Laughing, and Exercise Pads: Yes: daily  INTERCOURSE: Pain with intercourse: Initial Penetration and During Penetration Ability to have vaginal penetration:  Yes: with pain Climax: non painful WNL   PREGNANCY: Vaginal deliveries 1 Tearing Yes: 3rd degree tear C-section deliveries 1 Currently pregnant No  PROLAPSE: Periodically will feel vaginal bulging, heaviness in lower abdomen   OBJECTIVE:  11/09/22: No internal rectal or vaginal pelvic floor exam performed due to high level of skin irritation   Weak transversus abdominus contraction  Abdominal restriction and tenderness in bil lower quadrant  Unable to perform pelvic tilt with appropriate coordination  LUMBARAROM/PROM:  A/PROM A/PROM  Eval (% available) 11/09/22 (% available)  Flexion 50 90  Extension 25, pain anteriorly  25, pain in low back  Right lateral flexion 50, pain anteriorly  75, pain on right  Left lateral flexion 50, pain anteriorly  75, pain on right   (Blank rows = not tested) 07/21/22: standing prolapse assessment demonstrated grade 2 anterior vaginal wall laxity   06/08/22:               External Perineal Exam: WNL                              Internal Pelvic Floor: burning reported with palpation of superficial muscles; deep aching/pressure with palpation of Lt levator ani   Patient confirms identification and approves PT to assess internal pelvic floor and treatment Yes  PELVIC MMT:   MMT eval  Vaginal 3/5  Internal Anal Sphincter 1/5  External Anal Sphincter 2/5   Puborectalis 1/5  Diastasis Recti   (Blank rows = not tested)        TONE: High in Lt levator ani   PROLAPSE: Not able to tell this session due to dyssynergic pelvic floor contraction     06/01/22: COGNITION: Overall cognitive status: Within functional limits for tasks assessed     SENSATION: Light touch: Appears intact Proprioception: Appears intact  GAIT: Comments: decreased hip extension, forward flexed trunk  POSTURE: rounded shoulders, forward head, decreased lumbar lordosis, increased thoracic kyphosis, posterior pelvic tilt, and flexed trunk    LUMBARAROM/PROM:  A/PROM A/PROM  Eval (% available)  Flexion 50  Extension 25, pain anteriorly   Right lateral flexion 50, pain anteriorly   Left lateral flexion 50, pain anteriorly    (Blank rows = not tested)   PALPATION:   General  Significant abdominal restriction and tenderness; decreased rib mobility with mobilization and breathing    TODAY'S TREATMENT:                                                                                                                              DATE:  11/23/22 Exercises: Supine piriformis stretch 2 min  bil Single knee to chest 10x bil Wide leg lower trunk rotation 3 x 10 Supine pelvic tilts 12x Seated thoracic extensions on foam roller Therapeutic activities: Urge drill Voiding schedule/bladder training Double voiding   11/14/22 Neuromuscular re-education: Seated pelvic tilts 2 x 10 Seated hip adduction ball squeeze with pelvic floor muscle and transversus abdominus 2 x 10 Seated hip abduction with yellow band, pelvic floor muscle and transversus abdominus 2 x 10 Seated resisted march 2 x 10 yellow band with pelvic floor muscle and transversus abdominus 2 x 10 Seated horizontal abduction/extension 2 x 10 green band  Exercises: Seated forward fold 10 breaths  Seated piriformis stretch 2 min bil Kneeling hip flexor stretch 2 minutes bil  Seated wide leg trunk  rotation/adductor stretch 2 x 10  RE-EVAL 11/09/22 Neuromuscular re-education: Transversus abdominus training with multimodal cues for improved motor control and breath coordination  Supine pelvic tilts on dynadisc 2 x 10 Supine hip adduction with transversus abdominus activation on dynadisc 2 x 10 Supine UE ball press with transversus abdominus 2 x 10 Supine march on dynadisc 2 x 10 Supine bridge with adduction and transversus abdominus 2 x 10 Exercises: Seated lateral flexion 10x bil Seated forward fold 10 breaths Lower trunk rotation 2 x 10   PATIENT EDUCATION:  Education details: See above Person educated: Patient Education method: Programmer, multimedia, Demonstration, Actor cues, Verbal cues, and Handouts Education comprehension: verbalized understanding  HOME EXERCISE PROGRAM: [id number]  ASSESSMENT:  CLINICAL IMPRESSION: Believe increase in urgency may be due to frequency of bowel movements and training her bladder that it needs to empty more often; also likely that frequency of bowel movements and perineal irritation have increased pelvic floor muscle tension. We are still holding off on performing pelvic floor muscle assessment due to the high level of irritation in this area. We discussed voiding schedule, urge drill, and double voiding again to help optimize how bladder is functioning and decrease urgency/frequency. She did well with stretches to help improve low back pain. She will continue to benefit from skilled PT intervention in order to decrease pain, improve bowel movements, and decrease urinary urgency/incontinence.     OBJECTIVE IMPAIRMENTS: decreased activity tolerance, decreased coordination, decreased endurance, decreased mobility, decreased strength, increased fascial restrictions, increased muscle spasms, impaired tone, postural dysfunction, and pain.   ACTIVITY LIMITATIONS: lifting, bending, continence, and locomotion level  PARTICIPATION LIMITATIONS: interpersonal  relationship, community activity, and occupation  PERSONAL FACTORS: 1 comorbidity: medical history  are also affecting patient's functional outcome.   REHAB POTENTIAL: Good  CLINICAL DECISION MAKING: Stable/uncomplicated  EVALUATION COMPLEXITY: Low   GOALS: Goals reviewed with patient? Yes  SHORT TERM GOALS: Target date: 07/06/22 - updated 07/12/22 - updated 08/18/22 - updated 09/22/22 - updated 10/26/22 - updated 11/09/22  Pt will be independent with HEP.   Baseline: Goal status: MET 07/12/22  2.  Pt will be independent with diaphragmatic breathing and down training activities in order to improve pelvic floor relaxation.  Baseline:  Goal status: MET 07/12/22  3.  Pt will be independent with the knack, urge suppression technique, and double voiding in order to improve bladder habits and decrease urinary incontinence.   Baseline:  Goal status: MET 09/22/22  4.  Pt will be independent with use of squatty potty, relaxed toileting mechanics, and improved bowel movement techniques in order to increase ease of bowel movements and complete evacuation.   Baseline:  Goal status: MET 11/09/22  5.  Pt will be able to correctly perform diaphragmatic breathing and appropriate  pressure management in order to prevent worsening vaginal wall laxity and improve pelvic floor A/ROM.   Baseline:  Goal status: IN PROGRESS  LONG TERM GOALS: Target date: 11/16/2022 - updated 07/12/22 - updated 08/18/22 - updated 09/22/22 - updated 10/26/22 - updated 11/09/22  Pt will be independent with advanced HEP.   Baseline:  Goal status: IN PROGRESS  2.  Pt will demonstrate normal pelvic floor muscle tone and A/ROM, able to achieve 4/5 strength with contractions and 10 sec endurance, in order to provide appropriate lumbopelvic support in functional activities.   Baseline:  Goal status: IN PROGRESS  3.  Pt will increase all impaired lumbar A/ROM by 25% without pain.  Baseline:  Goal status:  IN PROGRESS  4.  Pt  will report pain no higher than 3/10 with any activity. Baseline:  Goal status:  IN PROGRESS  5.  Pt will report 0/10 pain with vaginal penetration in order to improve intimate relationship with partner.    Baseline:  Goal status:  IN PROGRESS  6.  Pt will be able to go 2-3 hours in between voids without urgency or incontinence in order to improve QOL and perform all functional activities with less difficulty.   Baseline:  Goal status: IN PROGRESS  7.  Pt will report no leaks with laughing, coughing, sneezing in order to improve comfort with interpersonal relationships and community activities.   Baseline:  Goal status:  IN PROGRESS  8.  Pt will have consistent bowel movements 4-5x/week without straining or pain.  Baseline:  Goal status:  IN PROGRESS  PLAN:  PT FREQUENCY: 1-2x/week  PT DURATION: 6 months  PLANNED INTERVENTIONS: Therapeutic exercises, Therapeutic activity, Neuromuscular re-education, Balance training, Gait training, Patient/Family education, Self Care, Joint mobilization, Dry Needling, Biofeedback, and Manual therapy  PLAN FOR NEXT SESSION: Continue to work on motor control activities surrounding pelvis; progress mobility; manual techniques as needed for comfort and to increase circulation.   Julio Alm, PT, DPT11/20/2411:00 AM

## 2022-11-23 NOTE — Patient Instructions (Signed)
Double-voiding:  This technique is to help with post-void dribbling, or leaking a little bit when you stand up right after urinating. Use relaxed toileting mechanics to urinate as much as you feel like you have to without straining. Sit back upright from leaning forward and relax this way for 10-20 seconds. Lean forward again to finish voiding any amount more.    Urge Incontinence  Ideal urination frequency is every 2-4 wakeful hours, which equates to 5-8 times within a 24-hour period.   Urge incontinence is leakage that occurs when the bladder muscle contracts, creating a sudden need to go before getting to the bathroom.   Going too often when your bladder isn't actually full can disrupt the body's automatic signals to store and hold urine longer, which will increase urgency/frequency.  In this case, the bladder "is running the show" and strategies can be learned to retrain this pattern.   One should be able to control the first urge to urinate, at around .  The bladder can hold up to a "grande latte," or . To help you gain control, practice the Urge Drill below when urgency strikes.  This drill will help retrain your bladder signals and allow you to store and hold urine longer.  The overall goal is to stretch out your time between voids to reach a more manageable voiding schedule.    Practice your "quick flicks" often throughout the day (each waking hour) even when you don't need feel the urge to go.  This will help strengthen your pelvic floor muscles, making them more effective in controlling leakage.  Urge Drill  When you feel an urge to go, follow these steps to regain control: Stop what you are doing and be still Take one deep breath, directing your air into your abdomen Think an affirming thought, such as "I've got this." Do 5 quick flicks of your pelvic floor Walk with control to the bathroom to void, or delay voiding   Voiding schedule: 1 hour and 15 minutes is the  goal; go whether you need to or not  [facility] [address] [place], [place] [address] Phone # [phone] Fax [phone]

## 2022-11-24 ENCOUNTER — Ambulatory Visit: Payer: Commercial Managed Care - PPO | Admitting: Occupational Therapy

## 2022-11-24 ENCOUNTER — Encounter: Payer: Self-pay | Admitting: Occupational Therapy

## 2022-11-24 DIAGNOSIS — I890 Lymphedema, not elsewhere classified: Secondary | ICD-10-CM

## 2022-11-24 NOTE — Therapy (Signed)
OUTPATIENT OCCUPATIONAL THERAPY TREATMENT NOTE  LOWER EXTREMITY LYMPHEDEMA  Patient Name: Karen [name] MRN: [id number] DOB:[birth year], 52 y.o., female Today's Date: 11/24/2022  END OF SESSION:   OT End of Session - 11/24/22 1414     Visit Number 3    Number of Visits 36    Date for OT Re-Evaluation 02/08/23    OT Start Time 0215    OT Stop Time 0315    OT Time Calculation (min) 60 min    Activity Tolerance Patient tolerated treatment well;No increased pain    Behavior During Therapy [facility] for tasks assessed/performed               Past Medical History:  Diagnosis Date   Adenomyosis    Anemia    Arthritis 2013   L knee gets steroid injections    Asthmatic bronchitis 01/31/2017   Back pain    Chronic constipation    Diarrhea    DVT of lower extremity (deep venous thrombosis) (HCC)    Dysmenorrhea    Endometriosis    Esophageal stricture    GERD (gastroesophageal reflux disease)    History of kidney stones    History of uterine fibroid    IBS (irritable bowel syndrome)    Interstitial cystitis    Lactose intolerance    Migraine    with aura   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   PONV (postoperative nausea and vomiting)    Recurrent upper respiratory infection (URI)    Sebaceous cyst of breast, right lower inner quadrant 10/25/2012   Excised 11/21/12    Sleep apnea    Syncope, non cardiac    Past Surgical History:  Procedure Laterality Date   ARTHROSCOPIC HAGLUNDS REPAIR     BREAST CYST EXCISION Right 11/21/2012   Procedure: CYST EXCISION BREAST;  Surgeon: Currie Paris, MD;  Location: [facility];  Service: General;  Laterality: Right;   BREAST CYST EXCISION Bilateral 01/18/2022   Procedure: EXCISION OF SEBACEOUS CYST BILATERAL BREAST;  Surgeon: Harriette Bouillon, MD;  Location: [facility];  Service: General;  Laterality: Bilateral;   BREAST EXCISIONAL BIOPSY Right 11/2012   CESAREAN SECTION  04/19/1993   CHOLECYSTECTOMY      COLONOSCOPY  05/02/2011   Procedure: COLONOSCOPY;  Surgeon: Vertell Novak., MD;  Location: [facility];  Service: Endoscopy;  Laterality: N/A;   colonoscopy  11/04/2014   DENTAL SURGERY  03/06/2018   2 surgeries ( 03/06/2018 and 04/06/2018)   to remove two separate benign tumors   ESOPHAGEAL MANOMETRY N/A 11/04/2015   Procedure: ESOPHAGEAL MANOMETRY (EM);  Surgeon: Carman Ching, MD;  Location: [facility];  Service: Endoscopy;  Laterality: N/A;   ESOPHAGOGASTRODUODENOSCOPY  05/02/2011   Procedure: ESOPHAGOGASTRODUODENOSCOPY (EGD);  Surgeon: Vertell Novak., MD;  Location: [facility];  Service: Endoscopy;  Laterality: N/A;   ESOPHAGOGASTRODUODENOSCOPY N/A 09/01/2014   Procedure: ESOPHAGOGASTRODUODENOSCOPY (EGD);  Surgeon: Carman Ching, MD;  Location: [facility];  Service: Endoscopy;  Laterality: N/A;   KNEE SURGERY     LAPAROSCOPY     x 4   LESION REMOVAL N/A 01/18/2022   Procedure: EXCISION OF SEBACEOUS CYSTS CHEST AND NECK;  Surgeon: Harriette Bouillon, MD;  Location: [facility];  Service: General;  Laterality: N/A;   PH IMPEDANCE STUDY N/A 11/04/2015   Procedure: PH IMPEDANCE STUDY;  Surgeon: Carman Ching, MD;  Location: [facility];  Service: Endoscopy;  Laterality: N/A;   VAGINAL HYSTERECTOMY  2010   TVH--ovaries remain   Patient  Active Problem List   Diagnosis Date Noted   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   Elevated LFTs 04/04/2021   Colitis 04/04/2021   Bacterial overgrowth syndrome 05/21/2020   Obesity 05/21/2020   History of endometriosis 05/21/2020   Constipation due to outlet dysfunction 02/05/2020   Gastroesophageal reflux disease 02/05/2020   Obstructive sleep apnea treated with continuous positive airway pressure (CPAP) 06/16/2017   Perennial allergic rhinitis with a nonallergic component 01/31/2017   Asthmatic bronchitis 01/31/2017   GI symptoms 01/31/2017   Family history of colon cancer 01/27/2015   Chest pain 12/13/2012   Dyspnea  12/13/2012   DVT of lower extremity (deep venous thrombosis) (HCC) 01/03/1990    PCP: Coral Else, MD  REFERRING PROVIDER: Lorenda Ishihara, MD  REFERRING DIAG: I89.0  THERAPY DIAG:  Lymphedema, not elsewhere classified  Rationale for Evaluation and Treatment: Rehabilitation  ONSET DATE: ~2014; exacerbation in 2023  SUBJECTIVE:                                                                                                                                                                                           SUBJECTIVE STATEMENT: Karen [name] returns to OT for Rx visit 1 of lymphedema care. Im really uncomfortable right now because of my digestive system. Pt has no new  lymphedema related  complaints. She denies LE-related pain.  PERTINENT HISTORY: CVI, OSA (no CPAP)  PAIN:  Are you having pain? Yes: NPRS scale: not rated/10 Pain location: BLE Pain description: tight, heavy, tingling, numbness Aggravating factors: standing, walking, dependent sitting > 30 minutes Relieving factors: elevation  PRECAUTIONS: Other: LYMPHEDEMA; Hx LLE DVT  RED FLAGS: Hx LLE DVT  WEIGHT BEARING RESTRICTIONS: No  FALLS:  Has patient fallen in last 6 months? No  LIVING ENVIRONMENT: Lives with: lives with spouse and daughter Lives in: House/apartment Stairs: Yes; Internal: 14 steps; yes and External: 4 steps; yes Has following equipment at home: None  OCCUPATION: [employer] operational officer, walking, desk work  LEISURE: family time  HAND DOMINANCE: right   PRIOR LEVEL OF FUNCTION: Independent  PATIENT GOALS: Feel better, be able to be more active without pain, wear preferred street shoes  OBJECTIVE: Note: Objective measures were completed at Evaluation unless otherwise noted.  COGNITION:  Overall cognitive status: Within functional limits for tasks assessed   OBSERVATIONS / OTHER ASSESSMENTS:   POSTURE: WFL  LE ROM: WFL  LE MMT: WFL  LYMPHEDEMA ASSESSMENTS: non-Ca  related  INFECTIONS: denies cellulitis and wound hx  BLE COMPARATIVE LIMB VOLUMETRICS Initial 11/15/22  LANDMARK RIGHT (dominant)  R LEG (A-D) 3028.9 ml  R THIGH (E-G) 4809.60 ml  R FULL LIMB (A-G) 7838.5  ml  Limb Volume differential (LVD)  %  Volume change since initial %  Volume change overall V  (Blank rows = not tested)  LANDMARK LEFT  (Rx)  L LEG (A-D) 3189.9 ml  L THIGH (E-G) 4959.8 ml  L FULL LIMB (A-G) 8149.7 ml  Limb Volume differential (LVD)  LEG LVD = 5.32%, L>R; THIGH LVD = 3.12%, L>R; And full LLE LVD = 3.97%, L>R. 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 Obesity  Skin  Description Hyper-Keratosis Peau d' Orange Shiny Tight Fibrotic/ Indurated Fatty Doughy Spongy/ boggy       R>L x  x   Skin dry Flaky WNL Macerated   mildly      Color Redness Varicosities Blanching Hemosiderin Stain Mottled        x   Odor Malodorous Yeast Fungal infection  WNL      x   Temperature Warm Cool wnl     x    Pitting Edema   1+ 2+ 3+ 4+ Non-pitting         x   Girth Symmetrical Asymmetrical                   Distribution    L>R toes to groin    Stemmer Sign Positive Negative   +L -R   Lymphorrhea History Of:  Present Absent     x    Wounds History Of Present Absent Venous Arterial Pressure Sheer     x        Signs of Infection Redness Warmth Erythema Acute Swelling Drainage Borders                    Sensation Light Touch Deep pressure Hypersensitivity   In tact Impaired In tact Impaired Absent Impaired   x  x  x     Nails WNL   Fungus nail dystrophy   x     Hair Growth Symmetrical Asymmetrical   x    Skin Creases Base of toes  Ankles   Base of Fingers knees       Abdominal pannus Thigh Lobules  Face/neck   x           GAIT: Distance walked: >500' Assistive device utilized: None Level of assistance: Complete  Independence Comments: Functional ambulation and transfers [facility]  LYMPHEDEMA LIFE IMPACT SCALE (LLIS): Initial 11/15/22  50%  FOTO functional outcome measure: TBA initial Rx visit 2/2 time constraints  TODAY'S TREATMENT:                                                                                                                                         MLD to LLE/LLQ Pt edu re lymphatic structure and function, simple self MLD  PATIENT EDUCATION:  Education details: Provided basic level education regarding lymphatic structure and function, etiology, onset patterns, stages of progression, and prevention to limit infection risk, worsening condition and further functional decline. Pt edu for aught interaction between blood circulatory system and lymphatic circulation.Discussed  impact of gravity and co-morbidities on lymphatic function. Outlined Complete Decongestive Therapy (CDT)  as standard of care and provided in depth information regarding 4 primary components of Intensive and Self Management Phases, including Manual Lymph Drainage (MLD), compression wrapping and garments, skin care, and therapeutic exercise. Homero Fellers discussion with re need for frequent attendance and high burden of care when caregiver is needed, impact of co morbidities. We discussed  the chronic, progressive nature of lymphedema and Importance of daily, ongoing LE self-care essential for limiting progression and infection risk.  Person educated: Patient Education method: Explanation, Demonstration, and Handouts Education comprehension: verbalized understanding, returned demonstration, and needs further education  HOME EXERCISE PROGRAM: BLE lymphatic pumping there ex using- 1 sets of 10 reps, each exercise in order-  1-2 x daily, bilaterally Simple self MLD 1 x daily Daily skin care to increase hydration, skin mobility and decrease infection risk- can be done during MLD Compression wraps 23/7 during intensive phase of  CDT Compression garments/ devices during self-management phase of CDT  ASSESSMENT:  CLINICAL IMPRESSION:  Emphasis of session on Pt edu for lymphatic structure and function in relation to simple self MLD and skin care. Commenced MLD to LLE/LLQ utilizing functional inguinal pathway, deep lymphatics, and short neck sequence. Discusses diaphragmatic breathing and will demonstrate that again next visit. Pt and OT agree that for now we will forgo bandaging and utilize existing , off the shelf compression socks ( 20-30 mmHg) for daily compression. We may want to consider increasing LLE compression to 30-40, ccl 2, since the L  swells inside the class one garment by the end of the day. Also provided edu Provided handout for reference for MLD. Next visit Cont as per POC.   11/15/22 Initial Evaluation: Karen [name] is a 52 y.o. female presenting with very mild, stage II, LLE lymphedema 2/2 venous insufficiency and obesity. L Leg swelling and associated pain swelling fluctuates. It has progressed over time, and now no longer resolves with elevation over night. RLE swelling limits balance during functional ambulation. It exacerbates infection and falls risk. LLE/RLQ lymphedema interferes with functional performance in all occupational domains, including basic and instrumental ADLs, productive activities, leisure pursuits and social participation. Pt will benefit from Occupational Therapy for Complete Decongestive Therapy (CDT) to restore function, to reduce physical and psychologic suffering associated with chronic, progressive lymphedema and associated pain, and to limit infection. CDT will include manual lymphatic drainage (MLD), skin care, therapeutic exercise and compression wraps and garment's devices. Without skilled OT for lymphedema care the condition will worsen and further functional decline is expected  Custom-made gradient compression garments and HOS devices are medically necessary because they are  uniquely sized and shaped to fit the exact dimensions of the affected extremities, and to provide appropriate medical grade, graduated compression essential for optimally managing chronic, progressive lymphedema. Multiple custom compression garments are needed to ensure proper hygiene to limit infection risk. Custom compression garments should be replaced q 3-6 months When worn consistently for optimal lipo-lymphedema self-management over time. HOS devices, medically necessary to limit fibrosis buildup in tissue, should be replaced q 2 years and PRN when worn out.      OBJECTIVE IMPAIRMENTS: decreased activity tolerance, decreased knowledge of condition, decreased knowledge of use  of DME, increased edema, impaired sensation, pain, and chronic, progressive leg swelling .   ACTIVITY LIMITATIONS: dependent sitting, extended standing and/ or walking, squatting, and lower body dressing, fitting preferred street shoes  PARTICIPATION LIMITATIONS: shopping, community activity, occupation, and yard work  PERSONAL FACTORS: 3+ comorbidities: Hx DVT,  OSA, CVI. Varicose veins  are also affecting patient's functional outcome.   REHAB POTENTIAL: Good  EVALUATION COMPLEXITY: Moderate   GOALS: Goals reviewed with patient? Yes  SHORT TERM GOALS: Target date: 4th OT Rx visit  SHORT TERM GOALS: Target date: 4th OT Rx visit   Pt will demonstrate understanding of lymphedema precautions and prevention strategies with modified independence using a printed reference to identify at least 5 precautions and discussing how s/he may implement them into daily life to reduce risk of progression with extra time. Baseline:Max A Goal status: INITIAL   Pt will be able to apply multilayer, knee length, gradient, compression wraps to one leg at a time with modified assistance (extra time and assistive device/s) to decrease limb volume, to limit infection risk, and to limit lymphedema progression.  Baseline: Dependent Goal  status: INITIAL  LONG TERM GOALS: Target date: 02/08/23  Given this patient's Intake score of TBA /100% on the functional outcomes FOTO tool, patient will experience an increase in function of 5 points to improve basic and instrumental ADLs performance, including lymphedema self-care.  Baseline: Max A Goal status: INITIAL   Given this patient's Intake score of TBA% on the Lymphedema Life Impact Scale (LLIS), patient will experience a reduction of at least 5 points in her perceived level of functional impairment resulting from lymphedema to improve functional performance and quality of life (QOL). Baseline: 64.71% Goal status: INITIAL  3 Pt will achieve at least a 10% volume reduction in B legs to return limb to typical size and shape, to limit infection risk and LE progression, to decrease pain, to improve function. Baseline: Dependent Goal status: INITIAL  4.  Pt will obtain proper compression garments/devices and achieve modified independence (extra time + assistive devices) with donning/doffing to optimize limb volume reductions and limit LE  progression over time. Baseline:  Goal status: INITIAL  5.  During Intensive phase CDT , with modified independence, Pt will achieve at least 85% compliance with all lymphedema self-care home program components, including daily skin care, compression wraps and /or garments, simple self MLD and lymphatic pumping therex to habituate LE self care protocol  into ADLs for optimal LE self-management over time. Baseline: Dependent Goal status: INITIAL   PLAN:  PT FREQUENCY: 2x/week  PT DURATION: 12 weeks  PLANNED INTERVENTIONS: 97110-Therapeutic exercises, 97530- Therapeutic activity, 97535- Self Care, 16109- Manual therapy, Patient/Family education, Manual lymph drainage, Compression bandaging, and fit with appropriate compression garments  PLAN FOR NEXT SESSION:  BLE comparative limb volumetrics Pt edu for LE self care  Loel Dubonnet, MS,  OTR/L, CLT-LANA 11/24/22 3:35 PM '

## 2022-11-25 ENCOUNTER — Ambulatory Visit: Payer: Commercial Managed Care - PPO | Admitting: Occupational Therapy

## 2022-11-25 ENCOUNTER — Other Ambulatory Visit: Payer: Self-pay | Admitting: Internal Medicine

## 2022-11-25 ENCOUNTER — Encounter: Payer: Self-pay | Admitting: Occupational Therapy

## 2022-11-25 ENCOUNTER — Ambulatory Visit
Admission: RE | Admit: 2022-11-25 | Discharge: 2022-11-25 | Disposition: A | Discharge status: Home / Self Care | Payer: Commercial Managed Care - PPO | Source: Ambulatory Visit | Attending: Obstetrics & Gynecology | Admitting: Obstetrics & Gynecology

## 2022-11-25 DIAGNOSIS — I890 Lymphedema, not elsewhere classified: Secondary | ICD-10-CM

## 2022-11-25 DIAGNOSIS — Z1231 Encounter for screening mammogram for malignant neoplasm of breast: Secondary | ICD-10-CM

## 2022-11-25 DIAGNOSIS — N6320 Unspecified lump in the left breast, unspecified quadrant: Secondary | ICD-10-CM

## 2022-11-28 ENCOUNTER — Ambulatory Visit (HOSPITAL_COMMUNITY): Payer: Commercial Managed Care - PPO

## 2022-11-28 ENCOUNTER — Other Ambulatory Visit: Payer: Self-pay | Admitting: Internal Medicine

## 2022-11-28 DIAGNOSIS — N6310 Unspecified lump in the right breast, unspecified quadrant: Secondary | ICD-10-CM

## 2022-11-29 ENCOUNTER — Ambulatory Visit: Payer: Commercial Managed Care - PPO | Admitting: Neurology

## 2022-11-29 ENCOUNTER — Ambulatory Visit: Payer: Commercial Managed Care - PPO | Admitting: Occupational Therapy

## 2022-11-29 DIAGNOSIS — G4733 Obstructive sleep apnea (adult) (pediatric): Secondary | ICD-10-CM

## 2022-11-29 DIAGNOSIS — I890 Lymphedema, not elsewhere classified: Secondary | ICD-10-CM

## 2022-11-29 DIAGNOSIS — F439 Reaction to severe stress, unspecified: Secondary | ICD-10-CM

## 2022-11-29 DIAGNOSIS — G5712 Meralgia paresthetica, left lower limb: Secondary | ICD-10-CM

## 2022-11-29 DIAGNOSIS — R634 Abnormal weight loss: Secondary | ICD-10-CM

## 2022-11-29 DIAGNOSIS — Z789 Other specified health status: Secondary | ICD-10-CM

## 2022-11-29 DIAGNOSIS — G43909 Migraine, unspecified, not intractable, without status migrainosus: Secondary | ICD-10-CM

## 2022-11-29 NOTE — Therapy (Signed)
OUTPATIENT OCCUPATIONAL THERAPY TREATMENT NOTE  LOWER EXTREMITY LYMPHEDEMA  Patient Name: Karen [name] MRN: [id number] DOB:06-12-1970, 52 y.o., female Today's Date: 11/29/2022  END OF SESSION:   OT End of Session - 11/29/22 2257     Visit Number 5    Number of Visits 36    Date for OT Re-Evaluation 02/08/23    OT Start Time 0300    OT Stop Time 0400    OT Time Calculation (min) 60 min    Activity Tolerance Patient tolerated treatment well;No increased pain    Behavior During Therapy [facility] for tasks assessed/performed               Past Medical History:  Diagnosis Date   Adenomyosis    Anemia    Arthritis 2013   L knee gets steroid injections    Asthmatic bronchitis 01/31/2017   Back pain    Chronic constipation    Diarrhea    DVT of lower extremity (deep venous thrombosis) (HCC)    Dysmenorrhea    Endometriosis    Esophageal stricture    GERD (gastroesophageal reflux disease)    History of kidney stones    History of uterine fibroid    IBS (irritable bowel syndrome)    Interstitial cystitis    Lactose intolerance    Migraine    with aura   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   PONV (postoperative nausea and vomiting)    Recurrent upper respiratory infection (URI)    Sebaceous cyst of breast, right lower inner quadrant 10/25/2012   Excised 11/21/12    Sleep apnea    Syncope, non cardiac    Past Surgical History:  Procedure Laterality Date   ARTHROSCOPIC HAGLUNDS REPAIR     BREAST CYST EXCISION Right 11/21/2012   Procedure: CYST EXCISION BREAST;  Surgeon: Currie Paris, MD;  Location: [facility];  Service: General;  Laterality: Right;   BREAST CYST EXCISION Bilateral 01/18/2022   Procedure: EXCISION OF SEBACEOUS CYST BILATERAL BREAST;  Surgeon: Harriette Bouillon, MD;  Location: [facility];  Service: General;  Laterality: Bilateral;   BREAST EXCISIONAL BIOPSY Right 11/2012   CESAREAN SECTION  04/19/1993   CHOLECYSTECTOMY      COLONOSCOPY  05/02/2011   Procedure: COLONOSCOPY;  Surgeon: Vertell Novak., MD;  Location: [facility];  Service: Endoscopy;  Laterality: N/A;   colonoscopy  11/04/2014   DENTAL SURGERY  03/06/2018   2 surgeries ( 03/06/2018 and 04/06/2018)   to remove two separate benign tumors   ESOPHAGEAL MANOMETRY N/A 11/04/2015   Procedure: ESOPHAGEAL MANOMETRY (EM);  Surgeon: Carman Ching, MD;  Location: [facility];  Service: Endoscopy;  Laterality: N/A;   ESOPHAGOGASTRODUODENOSCOPY  05/02/2011   Procedure: ESOPHAGOGASTRODUODENOSCOPY (EGD);  Surgeon: Vertell Novak., MD;  Location: [facility];  Service: Endoscopy;  Laterality: N/A;   ESOPHAGOGASTRODUODENOSCOPY N/A 09/01/2014   Procedure: ESOPHAGOGASTRODUODENOSCOPY (EGD);  Surgeon: Carman Ching, MD;  Location: [facility];  Service: Endoscopy;  Laterality: N/A;   KNEE SURGERY     LAPAROSCOPY     x 4   LESION REMOVAL N/A 01/18/2022   Procedure: EXCISION OF SEBACEOUS CYSTS CHEST AND NECK;  Surgeon: Harriette Bouillon, MD;  Location:  SURGERY CENTER;  Service: General;  Laterality: N/A;   PH IMPEDANCE STUDY N/A 11/04/2015   Procedure: PH IMPEDANCE STUDY;  Surgeon: Carman Ching, MD;  Location: [facility];  Service: Endoscopy;  Laterality: N/A;   VAGINAL HYSTERECTOMY  2010   TVH--ovaries remain   Patient  Active Problem List   Diagnosis Date Noted   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   Elevated LFTs 04/04/2021   Colitis 04/04/2021   Bacterial overgrowth syndrome 05/21/2020   Obesity 05/21/2020   History of endometriosis 05/21/2020   Constipation due to outlet dysfunction 02/05/2020   Gastroesophageal reflux disease 02/05/2020   Obstructive sleep apnea treated with continuous positive airway pressure (CPAP) 06/16/2017   Perennial allergic rhinitis with a nonallergic component 01/31/2017   Asthmatic bronchitis 01/31/2017   GI symptoms 01/31/2017   Family history of colon cancer 01/27/2015   Chest pain 12/13/2012   Dyspnea  12/13/2012   DVT of lower extremity (deep venous thrombosis) (HCC) 01/03/1990    PCP: Coral Else, MD  REFERRING PROVIDER: Lorenda Ishihara, MD  REFERRING DIAG: I89.0  THERAPY DIAG:  Lymphedema, not elsewhere classified  Rationale for Evaluation and Treatment: Rehabilitation  ONSET DATE: ~2014; exacerbation in 2023  SUBJECTIVE:                                                                                                                                                                                           SUBJECTIVE STATEMENT: Candise returns to OT for Rx visit for BLE/BLQ lymphedema care. Pt states she had no increased pain and improved intestinal motility after last session. She does not rate pain or gut discomfort  numerically.  PERTINENT HISTORY: CVI, OSA (no CPAP)  PAIN:  Are you having pain? Yes: NPRS scale: not rated/10 Pain location: BLE Pain description: tight, heavy, tingling, numbness Aggravating factors: standing, walking, dependent sitting > 30 minutes Relieving factors: elevation  PRECAUTIONS: Other: LYMPHEDEMA; Hx LLE DVT  RED FLAGS: Hx LLE DVT  WEIGHT BEARING RESTRICTIONS: No  FALLS:  Has patient fallen in last 6 months? No  LIVING ENVIRONMENT: Lives with: lives with spouse and daughter Lives in: House/apartment Stairs: Yes; Internal: 14 steps; yes and External: 4 steps; yes Has following equipment at home: None  OCCUPATION: [employer] operational officer, walking, desk work  LEISURE: family time  HAND DOMINANCE: right   PRIOR LEVEL OF FUNCTION: Independent  PATIENT GOALS: Feel better, be able to be more active without pain, wear preferred street shoes  OBJECTIVE: Note: Objective measures were completed at Evaluation unless otherwise noted.  COGNITION:  Overall cognitive status: Within functional limits for tasks assessed   OBSERVATIONS / OTHER ASSESSMENTS:   POSTURE: WFL  LE ROM: WFL  LE MMT: WFL  LYMPHEDEMA ASSESSMENTS: non-Ca  related  INFECTIONS: denies cellulitis and wound hx  BLE COMPARATIVE LIMB VOLUMETRICS Initial 11/15/22  LANDMARK RIGHT (dominant)  R LEG (A-D) 3028.9 ml  R THIGH (E-G) 4809.60 ml  R FULL LIMB (A-G)  7838.5 ml  Limb Volume differential (LVD)  %  Volume change since initial %  Volume change overall V  (Blank rows = not tested)  LANDMARK LEFT  (Rx)  L LEG (A-D) 3189.9 ml  L THIGH (E-G) 4959.8 ml  L FULL LIMB (A-G) 8149.7 ml  Limb Volume differential (LVD)  LEG LVD = 5.32%, L>R; THIGH LVD = 3.12%, L>R; And full LLE LVD = 3.97%, L>R. 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 Obesity  Skin  Description Hyper-Keratosis Peau d' Orange Shiny Tight Fibrotic/ Indurated Fatty Doughy Spongy/ boggy       R>L x  x   Skin dry Flaky WNL Macerated   mildly      Color Redness Varicosities Blanching Hemosiderin Stain Mottled        x   Odor Malodorous Yeast Fungal infection  WNL      x   Temperature Warm Cool wnl     x    Pitting Edema   1+ 2+ 3+ 4+ Non-pitting         x   Girth Symmetrical Asymmetrical                   Distribution    L>R toes to groin    Stemmer Sign Positive Negative   +L -R   Lymphorrhea History Of:  Present Absent     x    Wounds History Of Present Absent Venous Arterial Pressure Sheer     x        Signs of Infection Redness Warmth Erythema Acute Swelling Drainage Borders                    Sensation Light Touch Deep pressure Hypersensitivity   In tact Impaired In tact Impaired Absent Impaired   x  x  x     Nails WNL   Fungus nail dystrophy   x     Hair Growth Symmetrical Asymmetrical   x    Skin Creases Base of toes  Ankles   Base of Fingers knees       Abdominal pannus Thigh Lobules  Face/neck   x           GAIT: Distance walked: >500' Assistive device utilized: None Level of assistance: Complete  Independence Comments: Functional ambulation and transfers [facility]  LYMPHEDEMA LIFE IMPACT SCALE (LLIS): Initial 11/15/22  50%  FOTO functional outcome measure: TBA initial Rx visit 2/2 time constraints  TODAY'S TREATMENT:                                                                                                                                         MLD to LLE/LLQ Pt edu re lymphatic structure and function, simple self MLD  PATIENT EDUCATION:  Education details: Provided basic level education regarding lymphatic structure and function, etiology, onset patterns, stages of progression, and prevention to limit infection risk, worsening condition and further functional decline. Pt edu for aught interaction between blood circulatory system and lymphatic circulation.Discussed  impact of gravity and co-morbidities on lymphatic function. Outlined Complete Decongestive Therapy (CDT)  as standard of care and provided in depth information regarding 4 primary components of Intensive and Self Management Phases, including Manual Lymph Drainage (MLD), compression wrapping and garments, skin care, and therapeutic exercise. Homero Fellers discussion with re need for frequent attendance and high burden of care when caregiver is needed, impact of co morbidities. We discussed  the chronic, progressive nature of lymphedema and Importance of daily, ongoing LE self-care essential for limiting progression and infection risk.  Person educated: Patient Education method: Explanation, Demonstration, and Handouts Education comprehension: verbalized understanding, returned demonstration, and needs further education  HOME EXERCISE PROGRAM: BLE lymphatic pumping there ex using- 1 sets of 10 reps, each exercise in order-  1-2 x daily, bilaterally Simple self MLD 1 x daily Daily skin care to increase hydration, skin mobility and decrease infection risk- can be done during MLD Compression wraps 23/7 during intensive phase of  CDT Compression garments/ devices during self-management phase of CDT  ASSESSMENT:  CLINICAL IMPRESSION:  Continued MLD to LLE/LLQ utilizing functional inguinal pathway, deep lymphatics, and short neck sequence.Reviewed diaphragmatic breathing which patient mastered during MLD to abdomen stimulating deep abdominal lymphatics and ascending, transverse and descending colon. Good tolerance of manual therapy without   increased pain. Pt denied increased pain during manual therapy. Pt utilized existing knee length  compression stockings after manual therapy. Cont as per POC.   11/15/22 Initial Evaluation: Shirla Sailors is a 52 y.o. female presenting with very mild, stage II, LLE lymphedema 2/2 venous insufficiency and obesity. L Leg swelling and associated pain swelling fluctuates. It has progressed over time, and now no longer resolves with elevation over night. RLE swelling limits balance during functional ambulation. It exacerbates infection and falls risk. LLE/RLQ lymphedema interferes with functional performance in all occupational domains, including basic and instrumental ADLs, productive activities, leisure pursuits and social participation. Pt will benefit from Occupational Therapy for Complete Decongestive Therapy (CDT) to restore function, to reduce physical and psychologic suffering associated with chronic, progressive lymphedema and associated pain, and to limit infection. CDT will include manual lymphatic drainage (MLD), skin care, therapeutic exercise and compression wraps and garment's devices. Without skilled OT for lymphedema care the condition will worsen and further functional decline is expected  Custom-made gradient compression garments and HOS devices are medically necessary because they are uniquely sized and shaped to fit the exact dimensions of the affected extremities, and to provide appropriate medical grade, graduated compression essential for optimally managing chronic, progressive  lymphedema. Multiple custom compression garments are needed to ensure proper hygiene to limit infection risk. Custom compression garments should be replaced q 3-6 months When worn consistently for optimal lipo-lymphedema self-management over time. HOS devices, medically necessary to limit fibrosis buildup in tissue, should be replaced q 2 years and PRN when worn out.      OBJECTIVE IMPAIRMENTS: decreased activity tolerance, decreased knowledge of condition, decreased knowledge of use of DME, increased edema, impaired sensation, pain, and chronic, progressive leg swelling .   ACTIVITY LIMITATIONS: dependent sitting, extended standing and/ or walking, squatting, and lower body dressing, fitting preferred street shoes  PARTICIPATION LIMITATIONS: shopping, community activity, occupation, and yard work  PERSONAL FACTORS: 3+ comorbidities:  Hx DVT,  OSA, CVI. Varicose veins  are also affecting patient's functional outcome.   REHAB POTENTIAL: Good  EVALUATION COMPLEXITY: Moderate   GOALS: Goals reviewed with patient? Yes  SHORT TERM GOALS: Target date: 4th OT Rx visit  SHORT TERM GOALS: Target date: 4th OT Rx visit   Pt will demonstrate understanding of lymphedema precautions and prevention strategies with modified independence using a printed reference to identify at least 5 precautions and discussing how s/he may implement them into daily life to reduce risk of progression with extra time. Baseline:Max A Goal status: INITIAL   Pt will be able to apply multilayer, knee length, gradient, compression wraps to one leg at a time with modified assistance (extra time and assistive device/s) to decrease limb volume, to limit infection risk, and to limit lymphedema progression.  Baseline: Dependent Goal status: INITIAL  LONG TERM GOALS: Target date: 02/08/23  Given this patient's Intake score of TBA /100% on the functional outcomes FOTO tool, patient will experience an increase in function of 5  points to improve basic and instrumental ADLs performance, including lymphedema self-care.  Baseline: Max A Goal status: INITIAL   Given this patient's Intake score of TBA% on the Lymphedema Life Impact Scale (LLIS), patient will experience a reduction of at least 5 points in her perceived level of functional impairment resulting from lymphedema to improve functional performance and quality of life (QOL). Baseline: 64.71% Goal status: INITIAL  3 Pt will achieve at least a 10% volume reduction in B legs to return limb to typical size and shape, to limit infection risk and LE progression, to decrease pain, to improve function. Baseline: Dependent Goal status: INITIAL  4.  Pt will obtain proper compression garments/devices and achieve modified independence (extra time + assistive devices) with donning/doffing to optimize limb volume reductions and limit LE  progression over time. Baseline:  Goal status: INITIAL  5.  During Intensive phase CDT , with modified independence, Pt will achieve at least 85% compliance with all lymphedema self-care home program components, including daily skin care, compression wraps and /or garments, simple self MLD and lymphatic pumping therex to habituate LE self care protocol  into ADLs for optimal LE self-management over time. Baseline: Dependent Goal status: INITIAL   PLAN:  PT FREQUENCY: 2x/week  PT DURATION: 12 weeks  PLANNED INTERVENTIONS: 97110-Therapeutic exercises, 97530- Therapeutic activity, 97535- Self Care, 91478- Manual therapy, Patient/Family education, Manual lymph drainage, Compression bandaging, and fit with appropriate compression garments  PLAN FOR NEXT SESSION:  BLE comparative limb volumetrics Pt edu for LE self care  Loel Dubonnet, MS, OTR/L, CLT-LANA 11/29/22 10:58 PM '

## 2022-11-29 NOTE — Therapy (Signed)
OUTPATIENT OCCUPATIONAL THERAPY TREATMENT NOTE  LOWER EXTREMITY LYMPHEDEMA  Patient Name: Karen [name] MRN: [id number] DOB:February 18, 1970, 52 y.o., female Today's Date: 11/29/2022  END OF SESSION:   OT End of Session - 11/29/22 1939     Visit Number 4    Number of Visits 36    Date for OT Re-Evaluation 02/08/23    OT Start Time 0815    OT Stop Time 0905    OT Time Calculation (min) 50 min    Activity Tolerance Patient tolerated treatment well;No increased pain    Behavior During Therapy [facility] for tasks assessed/performed               Past Medical History:  Diagnosis Date   Adenomyosis    Anemia    Arthritis 2013   L knee gets steroid injections    Asthmatic bronchitis 01/31/2017   Back pain    Chronic constipation    Diarrhea    DVT of lower extremity (deep venous thrombosis) (HCC)    Dysmenorrhea    Endometriosis    Esophageal stricture    GERD (gastroesophageal reflux disease)    History of kidney stones    History of uterine fibroid    IBS (irritable bowel syndrome)    Interstitial cystitis    Lactose intolerance    Migraine    with aura   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   PONV (postoperative nausea and vomiting)    Recurrent upper respiratory infection (URI)    Sebaceous cyst of breast, right lower inner quadrant 10/25/2012   Excised 11/21/12    Sleep apnea    Syncope, non cardiac    Past Surgical History:  Procedure Laterality Date   ARTHROSCOPIC HAGLUNDS REPAIR     BREAST CYST EXCISION Right 11/21/2012   Procedure: CYST EXCISION BREAST;  Surgeon: Currie Paris, MD;  Location: [facility];  Service: General;  Laterality: Right;   BREAST CYST EXCISION Bilateral 01/18/2022   Procedure: EXCISION OF SEBACEOUS CYST BILATERAL BREAST;  Surgeon: Harriette Bouillon, MD;  Location: [facility];  Service: General;  Laterality: Bilateral;   BREAST EXCISIONAL BIOPSY Right 11/2012   CESAREAN SECTION  04/19/1993   CHOLECYSTECTOMY      COLONOSCOPY  05/02/2011   Procedure: COLONOSCOPY;  Surgeon: Vertell Novak., MD;  Location: [facility];  Service: Endoscopy;  Laterality: N/A;   colonoscopy  11/04/2014   DENTAL SURGERY  03/06/2018   2 surgeries ( 03/06/2018 and 04/06/2018)   to remove two separate benign tumors   ESOPHAGEAL MANOMETRY N/A 11/04/2015   Procedure: ESOPHAGEAL MANOMETRY (EM);  Surgeon: Carman Ching, MD;  Location: [facility];  Service: Endoscopy;  Laterality: N/A;   ESOPHAGOGASTRODUODENOSCOPY  05/02/2011   Procedure: ESOPHAGOGASTRODUODENOSCOPY (EGD);  Surgeon: Vertell Novak., MD;  Location: [facility];  Service: Endoscopy;  Laterality: N/A;   ESOPHAGOGASTRODUODENOSCOPY N/A 09/01/2014   Procedure: ESOPHAGOGASTRODUODENOSCOPY (EGD);  Surgeon: Carman Ching, MD;  Location: [facility];  Service: Endoscopy;  Laterality: N/A;   KNEE SURGERY     LAPAROSCOPY     x 4   LESION REMOVAL N/A 01/18/2022   Procedure: EXCISION OF SEBACEOUS CYSTS CHEST AND NECK;  Surgeon: Harriette Bouillon, MD;  Location: [facility];  Service: General;  Laterality: N/A;   PH IMPEDANCE STUDY N/A 11/04/2015   Procedure: PH IMPEDANCE STUDY;  Surgeon: Carman Ching, MD;  Location: [facility];  Service: Endoscopy;  Laterality: N/A;   VAGINAL HYSTERECTOMY  2010   TVH--ovaries remain   Patient  Active Problem List   Diagnosis Date Noted   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   Elevated LFTs 04/04/2021   Colitis 04/04/2021   Bacterial overgrowth syndrome 05/21/2020   Obesity 05/21/2020   History of endometriosis 05/21/2020   Constipation due to outlet dysfunction 02/05/2020   Gastroesophageal reflux disease 02/05/2020   Obstructive sleep apnea treated with continuous positive airway pressure (CPAP) 06/16/2017   Perennial allergic rhinitis with a nonallergic component 01/31/2017   Asthmatic bronchitis 01/31/2017   GI symptoms 01/31/2017   Family history of colon cancer 01/27/2015   Chest pain 12/13/2012   Dyspnea  12/13/2012   DVT of lower extremity (deep venous thrombosis) (HCC) 01/03/1990    PCP: Coral Else, MD  REFERRING PROVIDER: Lorenda Ishihara, MD  REFERRING DIAG: I89.0  THERAPY DIAG:  Lymphedema, not elsewhere classified  Rationale for Evaluation and Treatment: Rehabilitation  ONSET DATE: ~2014; exacerbation in 2023  SUBJECTIVE:                                                                                                                                                                                           SUBJECTIVE STATEMENT: [name] returns to OT for Rx visit for BLE/BLQ lymphedema care. Im really uncomfortable right now because of my digestive system. Pain is unchanged since last visit.  PERTINENT HISTORY: CVI, OSA (no CPAP)  PAIN:  Are you having pain? Yes: NPRS scale: not rated/10 Pain location: BLE Pain description: tight, heavy, tingling, numbness Aggravating factors: standing, walking, dependent sitting > 30 minutes Relieving factors: elevation  PRECAUTIONS: Other: LYMPHEDEMA; Hx LLE DVT  RED FLAGS: Hx LLE DVT  WEIGHT BEARING RESTRICTIONS: No  FALLS:  Has patient fallen in last 6 months? No  LIVING ENVIRONMENT: Lives with: lives with spouse and daughter Lives in: House/apartment Stairs: Yes; Internal: 14 steps; yes and External: 4 steps; yes Has following equipment at home: None  OCCUPATION: [employer] operational officer, walking, desk work  LEISURE: family time  HAND DOMINANCE: right   PRIOR LEVEL OF FUNCTION: Independent  PATIENT GOALS: Feel better, be able to be more active without pain, wear preferred street shoes  OBJECTIVE: Note: Objective measures were completed at Evaluation unless otherwise noted.  COGNITION:  Overall cognitive status: Within functional limits for tasks assessed   OBSERVATIONS / OTHER ASSESSMENTS:   POSTURE: WFL  LE ROM: WFL  LE MMT: WFL  LYMPHEDEMA ASSESSMENTS: non-Ca related  INFECTIONS: denies cellulitis  and wound hx  BLE COMPARATIVE LIMB VOLUMETRICS Initial 11/15/22  LANDMARK RIGHT (dominant)  R LEG (A-D) 3028.9 ml  R THIGH (E-G) 4809.60 ml  R FULL LIMB (A-G) 7838.5 ml  Limb Volume differential (LVD)  %  Volume change since initial %  Volume change overall V  (Blank rows = not tested)  LANDMARK LEFT  (Rx)  L LEG (A-D) 3189.9 ml  L THIGH (E-G) 4959.8 ml  L FULL LIMB (A-G) 8149.7 ml  Limb Volume differential (LVD)  LEG LVD = 5.32%, L>R; THIGH LVD = 3.12%, L>R; And full LLE LVD = 3.97%, L>R. 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 Obesity  Skin  Description Hyper-Keratosis Peau d' Orange Shiny Tight Fibrotic/ Indurated Fatty Doughy Spongy/ boggy       R>L x  x   Skin dry Flaky WNL Macerated   mildly      Color Redness Varicosities Blanching Hemosiderin Stain Mottled        x   Odor Malodorous Yeast Fungal infection  WNL      x   Temperature Warm Cool wnl     x    Pitting Edema   1+ 2+ 3+ 4+ Non-pitting         x   Girth Symmetrical Asymmetrical                   Distribution    L>R toes to groin    Stemmer Sign Positive Negative   +L -R   Lymphorrhea History Of:  Present Absent     x    Wounds History Of Present Absent Venous Arterial Pressure Sheer     x        Signs of Infection Redness Warmth Erythema Acute Swelling Drainage Borders                    Sensation Light Touch Deep pressure Hypersensitivity   In tact Impaired In tact Impaired Absent Impaired   x  x  x     Nails WNL   Fungus nail dystrophy   x     Hair Growth Symmetrical Asymmetrical   x    Skin Creases Base of toes  Ankles   Base of Fingers knees       Abdominal pannus Thigh Lobules  Face/neck   x           GAIT: Distance walked: >500' Assistive device utilized: None Level of assistance: Complete Independence Comments: Functional ambulation and  transfers [facility]  LYMPHEDEMA LIFE IMPACT SCALE (LLIS): Initial 11/15/22  50%  FOTO functional outcome measure: TBA initial Rx visit 2/2 time constraints  TODAY'S TREATMENT:                                                                                                                                         MLD to LLE/LLQ Pt edu re lymphatic structure and function, simple self MLD   PATIENT EDUCATION:  Education details: Provided basic level education  regarding lymphatic structure and function, etiology, onset patterns, stages of progression, and prevention to limit infection risk, worsening condition and further functional decline. Pt edu for aught interaction between blood circulatory system and lymphatic circulation.Discussed  impact of gravity and co-morbidities on lymphatic function. Outlined Complete Decongestive Therapy (CDT)  as standard of care and provided in depth information regarding 4 primary components of Intensive and Self Management Phases, including Manual Lymph Drainage (MLD), compression wrapping and garments, skin care, and therapeutic exercise. Homero Fellers discussion with re need for frequent attendance and high burden of care when caregiver is needed, impact of co morbidities. We discussed  the chronic, progressive nature of lymphedema and Importance of daily, ongoing LE self-care essential for limiting progression and infection risk.  Person educated: Patient Education method: Explanation, Demonstration, and Handouts Education comprehension: verbalized understanding, returned demonstration, and needs further education  HOME EXERCISE PROGRAM: BLE lymphatic pumping there ex using- 1 sets of 10 reps, each exercise in order-  1-2 x daily, bilaterally Simple self MLD 1 x daily Daily skin care to increase hydration, skin mobility and decrease infection risk- can be done during MLD Compression wraps 23/7 during intensive phase of CDT Compression garments/ devices during self-management  phase of CDT  ASSESSMENT:  CLINICAL IMPRESSION:  Continued MLD to LLE/LLQ utilizing functional inguinal pathway, deep lymphatics, and short neck sequence.Reviewed diaphragmatic breathing which patient mastered during MLD to abdomen stimulating deep abdominal lymphatics and ascending, transverse and descending colon. Pt denied increased pain during manual therapy. Pt utilized existing knee length  compression stockings after manual therapy. Cont as per POC.   11/15/22 Initial Evaluation: Urania Burdine is a 52 y.o. female presenting with very mild, stage II, LLE lymphedema 2/2 venous insufficiency and obesity. L Leg swelling and associated pain swelling fluctuates. It has progressed over time, and now no longer resolves with elevation over night. RLE swelling limits balance during functional ambulation. It exacerbates infection and falls risk. LLE/RLQ lymphedema interferes with functional performance in all occupational domains, including basic and instrumental ADLs, productive activities, leisure pursuits and social participation. Pt will benefit from Occupational Therapy for Complete Decongestive Therapy (CDT) to restore function, to reduce physical and psychologic suffering associated with chronic, progressive lymphedema and associated pain, and to limit infection. CDT will include manual lymphatic drainage (MLD), skin care, therapeutic exercise and compression wraps and garment's devices. Without skilled OT for lymphedema care the condition will worsen and further functional decline is expected  Custom-made gradient compression garments and HOS devices are medically necessary because they are uniquely sized and shaped to fit the exact dimensions of the affected extremities, and to provide appropriate medical grade, graduated compression essential for optimally managing chronic, progressive lymphedema. Multiple custom compression garments are needed to ensure proper hygiene to limit infection risk. Custom  compression garments should be replaced q 3-6 months When worn consistently for optimal lipo-lymphedema self-management over time. HOS devices, medically necessary to limit fibrosis buildup in tissue, should be replaced q 2 years and PRN when worn out.      OBJECTIVE IMPAIRMENTS: decreased activity tolerance, decreased knowledge of condition, decreased knowledge of use of DME, increased edema, impaired sensation, pain, and chronic, progressive leg swelling .   ACTIVITY LIMITATIONS: dependent sitting, extended standing and/ or walking, squatting, and lower body dressing, fitting preferred street shoes  PARTICIPATION LIMITATIONS: shopping, community activity, occupation, and yard work  PERSONAL FACTORS: 3+ comorbidities: Hx DVT,  OSA, CVI. Varicose veins  are also affecting patient's functional outcome.   REHAB POTENTIAL: Good  EVALUATION COMPLEXITY: Moderate   GOALS: Goals reviewed with patient? Yes  SHORT TERM GOALS: Target date: 4th OT Rx visit  SHORT TERM GOALS: Target date: 4th OT Rx visit   Pt will demonstrate understanding of lymphedema precautions and prevention strategies with modified independence using a printed reference to identify at least 5 precautions and discussing how s/he may implement them into daily life to reduce risk of progression with extra time. Baseline:Max A Goal status: INITIAL   Pt will be able to apply multilayer, knee length, gradient, compression wraps to one leg at a time with modified assistance (extra time and assistive device/s) to decrease limb volume, to limit infection risk, and to limit lymphedema progression.  Baseline: Dependent Goal status: INITIAL  LONG TERM GOALS: Target date: 02/08/23  Given this patient's Intake score of TBA /100% on the functional outcomes FOTO tool, patient will experience an increase in function of 5 points to improve basic and instrumental ADLs performance, including lymphedema self-care.  Baseline: Max A Goal status:  INITIAL   Given this patient's Intake score of TBA% on the Lymphedema Life Impact Scale (LLIS), patient will experience a reduction of at least 5 points in her perceived level of functional impairment resulting from lymphedema to improve functional performance and quality of life (QOL). Baseline: 64.71% Goal status: INITIAL  3 Pt will achieve at least a 10% volume reduction in B legs to return limb to typical size and shape, to limit infection risk and LE progression, to decrease pain, to improve function. Baseline: Dependent Goal status: INITIAL  4.  Pt will obtain proper compression garments/devices and achieve modified independence (extra time + assistive devices) with donning/doffing to optimize limb volume reductions and limit LE  progression over time. Baseline:  Goal status: INITIAL  5.  During Intensive phase CDT , with modified independence, Pt will achieve at least 85% compliance with all lymphedema self-care home program components, including daily skin care, compression wraps and /or garments, simple self MLD and lymphatic pumping therex to habituate LE self care protocol  into ADLs for optimal LE self-management over time. Baseline: Dependent Goal status: INITIAL   PLAN:  PT FREQUENCY: 2x/week  PT DURATION: 12 weeks  PLANNED INTERVENTIONS: 97110-Therapeutic exercises, 97530- Therapeutic activity, 97535- Self Care, 32440- Manual therapy, Patient/Family education, Manual lymph drainage, Compression bandaging, and fit with appropriate compression garments  PLAN FOR NEXT SESSION:  BLE comparative limb volumetrics Pt edu for LE self care  Loel Dubonnet, MS, OTR/L, CLT-LANA 11/29/22 7:40 PM '

## 2022-11-30 DIAGNOSIS — E669 Obesity, unspecified: Secondary | ICD-10-CM | POA: Diagnosis not present

## 2022-11-30 DIAGNOSIS — L280 Lichen simplex chronicus: Secondary | ICD-10-CM | POA: Diagnosis not present

## 2022-11-30 DIAGNOSIS — Z6831 Body mass index (BMI) 31.0-31.9, adult: Secondary | ICD-10-CM | POA: Diagnosis not present

## 2022-11-30 DIAGNOSIS — M79642 Pain in left hand: Secondary | ICD-10-CM | POA: Diagnosis not present

## 2022-11-30 DIAGNOSIS — R809 Proteinuria, unspecified: Secondary | ICD-10-CM | POA: Diagnosis not present

## 2022-11-30 DIAGNOSIS — M79641 Pain in right hand: Secondary | ICD-10-CM | POA: Diagnosis not present

## 2022-11-30 DIAGNOSIS — R7982 Elevated C-reactive protein (CRP): Secondary | ICD-10-CM | POA: Diagnosis not present

## 2022-11-30 DIAGNOSIS — M3501 Sicca syndrome with keratoconjunctivitis: Secondary | ICD-10-CM | POA: Diagnosis not present

## 2022-12-05 ENCOUNTER — Telehealth: Payer: Self-pay

## 2022-12-05 DIAGNOSIS — H524 Presbyopia: Secondary | ICD-10-CM | POA: Diagnosis not present

## 2022-12-05 NOTE — Telephone Encounter (Signed)
Received phone call from patient stating that she had labs drawn at her rheumatology appointment and concerned some of the values were a problem with her hemachromatosis.  Pt educated that Dr. Myna Hidalgo used her iron saturation and ferritin as his parameters and those were not drawn. Pt educated that her hemoglobin and hematocrit were within normal limits. Pt educated that if she wanted a lab appointment she could to have her labs evaluated and pt agreeable to plan.

## 2022-12-06 ENCOUNTER — Ambulatory Visit: Payer: Commercial Managed Care - PPO | Attending: Surgery | Admitting: Occupational Therapy

## 2022-12-06 DIAGNOSIS — I890 Lymphedema, not elsewhere classified: Secondary | ICD-10-CM | POA: Insufficient documentation

## 2022-12-06 NOTE — Therapy (Signed)
OUTPATIENT OCCUPATIONAL THERAPY TREATMENT NOTE  LOWER EXTREMITY LYMPHEDEMA  Patient Name: Karen [name] MRN: [id number] DOB:[birth year], 52 y.o., female Today's Date: 12/06/2022  END OF SESSION:   OT End of Session - 12/06/22 1313     Visit Number 6    Number of Visits 36    Date for OT Re-Evaluation 02/08/23    OT Start Time 0105    OT Stop Time 0215    OT Time Calculation (min) 70 min    Activity Tolerance Patient tolerated treatment well;No increased pain    Behavior During Therapy [facility] for tasks assessed/performed               Past Medical History:  Diagnosis Date   Adenomyosis    Anemia    Arthritis 2013   L knee gets steroid injections    Asthmatic bronchitis 01/31/2017   Back pain    Chronic constipation    Diarrhea    DVT of lower extremity (deep venous thrombosis) (HCC)    Dysmenorrhea    Endometriosis    Esophageal stricture    GERD (gastroesophageal reflux disease)    History of kidney stones    History of uterine fibroid    IBS (irritable bowel syndrome)    Interstitial cystitis    Lactose intolerance    Migraine    with aura   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   PONV (postoperative nausea and vomiting)    Recurrent upper respiratory infection (URI)    Sebaceous cyst of breast, right lower inner quadrant 10/25/2012   Excised 11/21/12    Sleep apnea    Syncope, non cardiac    Past Surgical History:  Procedure Laterality Date   ARTHROSCOPIC HAGLUNDS REPAIR     BREAST CYST EXCISION Right 11/21/2012   Procedure: CYST EXCISION BREAST;  Surgeon: Currie Paris, MD;  Location: [facility];  Service: General;  Laterality: Right;   BREAST CYST EXCISION Bilateral 01/18/2022   Procedure: EXCISION OF SEBACEOUS CYST BILATERAL BREAST;  Surgeon: Harriette Bouillon, MD;  Location: [facility];  Service: General;  Laterality: Bilateral;   BREAST EXCISIONAL BIOPSY Right 11/2012   CESAREAN SECTION  04/19/1993   CHOLECYSTECTOMY      COLONOSCOPY  05/02/2011   Procedure: COLONOSCOPY;  Surgeon: Vertell Novak., MD;  Location: [facility];  Service: Endoscopy;  Laterality: N/A;   colonoscopy  11/04/2014   DENTAL SURGERY  03/06/2018   2 surgeries ( 03/06/2018 and 04/06/2018)   to remove two separate benign tumors   ESOPHAGEAL MANOMETRY N/A 11/04/2015   Procedure: ESOPHAGEAL MANOMETRY (EM);  Surgeon: Carman Ching, MD;  Location: [facility];  Service: Endoscopy;  Laterality: N/A;   ESOPHAGOGASTRODUODENOSCOPY  05/02/2011   Procedure: ESOPHAGOGASTRODUODENOSCOPY (EGD);  Surgeon: Vertell Novak., MD;  Location: [facility];  Service: Endoscopy;  Laterality: N/A;   ESOPHAGOGASTRODUODENOSCOPY N/A 09/01/2014   Procedure: ESOPHAGOGASTRODUODENOSCOPY (EGD);  Surgeon: Carman Ching, MD;  Location: [facility];  Service: Endoscopy;  Laterality: N/A;   KNEE SURGERY     LAPAROSCOPY     x 4   LESION REMOVAL N/A 01/18/2022   Procedure: EXCISION OF SEBACEOUS CYSTS CHEST AND NECK;  Surgeon: Harriette Bouillon, MD;  Location: [facility];  Service: General;  Laterality: N/A;   PH IMPEDANCE STUDY N/A 11/04/2015   Procedure: PH IMPEDANCE STUDY;  Surgeon: Carman Ching, MD;  Location: [facility];  Service: Endoscopy;  Laterality: N/A;   VAGINAL HYSTERECTOMY  2010   TVH--ovaries remain   Patient  Active Problem List   Diagnosis Date Noted   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   Elevated LFTs 04/04/2021   Colitis 04/04/2021   Bacterial overgrowth syndrome 05/21/2020   Obesity 05/21/2020   History of endometriosis 05/21/2020   Constipation due to outlet dysfunction 02/05/2020   Gastroesophageal reflux disease 02/05/2020   Obstructive sleep apnea treated with continuous positive airway pressure (CPAP) 06/16/2017   Perennial allergic rhinitis with a nonallergic component 01/31/2017   Asthmatic bronchitis 01/31/2017   GI symptoms 01/31/2017   Family history of colon cancer 01/27/2015   Chest pain 12/13/2012   Dyspnea  12/13/2012   DVT of lower extremity (deep venous thrombosis) (HCC) 01/03/1990    PCP: Karen Else, MD  REFERRING PROVIDER: Lorenda Ishihara, MD  REFERRING DIAG: I89.0  THERAPY DIAG:  Lymphedema, not elsewhere classified  Rationale for Evaluation and Treatment: Rehabilitation  ONSET DATE: ~2014; exacerbation in 2023  SUBJECTIVE:                                                                                                                                                                                           SUBJECTIVE STATEMENT: Karen [name] returns to OT for Rx visit for BLE/BLQ lymphedema care. Pt denies lymphedema related leg pain. She reports increased feeling of heaviness in her gut.  PERTINENT HISTORY: CVI, OSA (no CPAP)  PAIN:  Are you having pain? Yes: NPRS scale: not rated/10 Pain location: BLE Pain description: tight, heavy, tingling, numbness Aggravating factors: standing, walking, dependent sitting > 30 minutes Relieving factors: elevation  PRECAUTIONS: Other: LYMPHEDEMA; Hx LLE DVT  RED FLAGS: Hx LLE DVT  WEIGHT BEARING RESTRICTIONS: No  FALLS:  Has patient fallen in last 6 months? No  LIVING ENVIRONMENT: Lives with: lives with spouse and daughter Lives in: House/apartment Stairs: Yes; Internal: 14 steps; yes and External: 4 steps; yes Has following equipment at home: None  OCCUPATION: [employer] operational officer, walking, desk work  LEISURE: family time  HAND DOMINANCE: right   PRIOR LEVEL OF FUNCTION: Independent  PATIENT GOALS: Feel better, be able to be more active without pain, wear preferred street shoes  OBJECTIVE: Note: Objective measures were completed at Evaluation unless otherwise noted.  COGNITION:  Overall cognitive status: Within functional limits for tasks assessed   OBSERVATIONS / OTHER ASSESSMENTS:   POSTURE: WFL  LE ROM: WFL  LE MMT: WFL  LYMPHEDEMA ASSESSMENTS: non-Ca related  INFECTIONS: denies cellulitis and wound  hx  BLE COMPARATIVE LIMB VOLUMETRICS Initial 11/15/22  LANDMARK RIGHT (dominant)  R LEG (A-D) 3028.9 ml  R THIGH (E-G) 4809.60 ml  R FULL LIMB (A-G) 7838.5 ml  Limb Volume differential (LVD)  %  Volume change since initial %  Volume change overall V  (Blank rows = not tested)  LANDMARK LEFT  (Rx)  L LEG (A-D) 3189.9 ml  L THIGH (E-G) 4959.8 ml  L FULL LIMB (A-G) 8149.7 ml  Limb Volume differential (LVD)  LEG LVD = 5.32%, L>R; THIGH LVD = 3.12%, L>R; And full LLE LVD = 3.97%, L>R. 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 Obesity  Skin  Description Hyper-Keratosis Peau d' Orange Shiny Tight Fibrotic/ Indurated Fatty Doughy Spongy/ boggy       R>L x  x   Skin dry Flaky WNL Macerated   mildly      Color Redness Varicosities Blanching Hemosiderin Stain Mottled        x   Odor Malodorous Yeast Fungal infection  WNL      x   Temperature Warm Cool wnl     x    Pitting Edema   1+ 2+ 3+ 4+ Non-pitting         x   Girth Symmetrical Asymmetrical                   Distribution    L>R toes to groin    Stemmer Sign Positive Negative   +L -R   Lymphorrhea History Of:  Present Absent     x    Wounds History Of Present Absent Venous Arterial Pressure Sheer     x        Signs of Infection Redness Warmth Erythema Acute Swelling Drainage Borders                    Sensation Light Touch Deep pressure Hypersensitivity   In tact Impaired In tact Impaired Absent Impaired   x  x  x     Nails WNL   Fungus nail dystrophy   x     Hair Growth Symmetrical Asymmetrical   x    Skin Creases Base of toes  Ankles   Base of Fingers knees       Abdominal pannus Thigh Lobules  Face/neck   x           GAIT: Distance walked: >500' Assistive device utilized: None Level of assistance: Complete Independence Comments: Functional ambulation and transfers  [facility]  LYMPHEDEMA LIFE IMPACT SCALE (LLIS): Initial 11/15/22  50%  FOTO functional outcome measure: TBA initial Rx visit 2/2 time constraints  TODAY'S TREATMENT:                                                                                                                                         MLD to LLE/LLQ Pt edu re lymphatic structure and function, simple self MLD   PATIENT EDUCATION:  Education details: Provided basic level education  regarding lymphatic structure and function, etiology, onset patterns, stages of progression, and prevention to limit infection risk, worsening condition and further functional decline. Pt edu for aught interaction between blood circulatory system and lymphatic circulation.Discussed  impact of gravity and co-morbidities on lymphatic function. Outlined Complete Decongestive Therapy (CDT)  as standard of care and provided in depth information regarding 4 primary components of Intensive and Self Management Phases, including Manual Lymph Drainage (MLD), compression wrapping and garments, skin care, and therapeutic exercise. Homero Fellers discussion with re need for frequent attendance and high burden of care when caregiver is needed, impact of co morbidities. We discussed  the chronic, progressive nature of lymphedema and Importance of daily, ongoing LE self-care essential for limiting progression and infection risk.  Person educated: Patient Education method: Explanation, Demonstration, and Handouts Education comprehension: verbalized understanding, returned demonstration, and needs further education  HOME EXERCISE PROGRAM: BLE lymphatic pumping there ex using- 1 sets of 10 reps, each exercise in order-  1-2 x daily, bilaterally Simple self MLD 1 x daily Daily skin care to increase hydration, skin mobility and decrease infection risk- can be done during MLD Compression wraps 23/7 during intensive phase of CDT Compression garments/ devices during self-management phase of  CDT  ASSESSMENT:  CLINICAL IMPRESSION:  Continued MLD to LLE/LLQ utilizing functional inguinal pathway, deep lymphatics, and short neck sequence.Reviewed diaphragmatic breathing which patient mastered during MLD to abdomen stimulating deep abdominal lymphatics and ascending, transverse and descending colon. Good tolerance of manual therapy without   increased pain. Pt denied increased pain during manual therapy. Pt utilized existing knee length  compression stockings after manual therapy. OT and Pt discussed compression garment options. OT faxed demographics to DME vendor after session for OOP estimate.Cont as per POC.   11/15/22 Initial Evaluation: Kimball Vander is a 52 y.o. female presenting with very mild, stage II, LLE lymphedema 2/2 venous insufficiency and obesity. L Leg swelling and associated pain swelling fluctuates. It has progressed over time, and now no longer resolves with elevation over night. RLE swelling limits balance during functional ambulation. It exacerbates infection and falls risk. LLE/RLQ lymphedema interferes with functional performance in all occupational domains, including basic and instrumental ADLs, productive activities, leisure pursuits and social participation. Pt will benefit from Occupational Therapy for Complete Decongestive Therapy (CDT) to restore function, to reduce physical and psychologic suffering associated with chronic, progressive lymphedema and associated pain, and to limit infection. CDT will include manual lymphatic drainage (MLD), skin care, therapeutic exercise and compression wraps and garment's devices. Without skilled OT for lymphedema care the condition will worsen and further functional decline is expected  Custom-made gradient compression garments and HOS devices are medically necessary because they are uniquely sized and shaped to fit the exact dimensions of the affected extremities, and to provide appropriate medical grade, graduated compression  essential for optimally managing chronic, progressive lymphedema. Multiple custom compression garments are needed to ensure proper hygiene to limit infection risk. Custom compression garments should be replaced q 3-6 months When worn consistently for optimal lipo-lymphedema self-management over time. HOS devices, medically necessary to limit fibrosis buildup in tissue, should be replaced q 2 years and PRN when worn out.      OBJECTIVE IMPAIRMENTS: decreased activity tolerance, decreased knowledge of condition, decreased knowledge of use of DME, increased edema, impaired sensation, pain, and chronic, progressive leg swelling .   ACTIVITY LIMITATIONS: dependent sitting, extended standing and/ or walking, squatting, and lower body dressing, fitting preferred street shoes  PARTICIPATION LIMITATIONS: shopping, community activity, occupation,  and yard work  PERSONAL FACTORS: 3+ comorbidities: Hx DVT,  OSA, CVI. Varicose veins  are also affecting patient's functional outcome.   REHAB POTENTIAL: Good  EVALUATION COMPLEXITY: Moderate   GOALS: Goals reviewed with patient? Yes  SHORT TERM GOALS: Target date: 4th OT Rx visit  SHORT TERM GOALS: Target date: 4th OT Rx visit   Pt will demonstrate understanding of lymphedema precautions and prevention strategies with modified independence using a printed reference to identify at least 5 precautions and discussing how s/he may implement them into daily life to reduce risk of progression with extra time. Baseline:Max A Goal status: INITIAL   Pt will be able to apply multilayer, knee length, gradient, compression wraps to one leg at a time with modified assistance (extra time and assistive device/s) to decrease limb volume, to limit infection risk, and to limit lymphedema progression.  Baseline: Dependent Goal status: INITIAL  LONG TERM GOALS: Target date: 02/08/23  Given this patient's Intake score of TBA /100% on the functional outcomes FOTO tool,  patient will experience an increase in function of 5 points to improve basic and instrumental ADLs performance, including lymphedema self-care.  Baseline: Max A Goal status: INITIAL   Given this patient's Intake score of TBA% on the Lymphedema Life Impact Scale (LLIS), patient will experience a reduction of at least 5 points in her perceived level of functional impairment resulting from lymphedema to improve functional performance and quality of life (QOL). Baseline: 64.71% Goal status: INITIAL  3 Pt will achieve at least a 10% volume reduction in B legs to return limb to typical size and shape, to limit infection risk and LE progression, to decrease pain, to improve function. Baseline: Dependent Goal status: INITIAL  4.  Pt will obtain proper compression garments/devices and achieve modified independence (extra time + assistive devices) with donning/doffing to optimize limb volume reductions and limit LE  progression over time. Baseline:  Goal status: INITIAL  5.  During Intensive phase CDT , with modified independence, Pt will achieve at least 85% compliance with all lymphedema self-care home program components, including daily skin care, compression wraps and /or garments, simple self MLD and lymphatic pumping therex to habituate LE self care protocol  into ADLs for optimal LE self-management over time. Baseline: Dependent Goal status: INITIAL   PLAN:  PT FREQUENCY: 2x/week  PT DURATION: 12 weeks  PLANNED INTERVENTIONS: 97110-Therapeutic exercises, 97530- Therapeutic activity, 97535- Self Care, 06301- Manual therapy, Patient/Family education, Manual lymph drainage, Compression bandaging, and fit with appropriate compression garments  PLAN FOR NEXT SESSION:  BLE comparative limb volumetrics Pt edu for LE self care  Loel Dubonnet, MS, OTR/L, CLT-LANA 12/06/22 3:51 PM '

## 2022-12-06 NOTE — Therapy (Deleted)
[facility] Rehabilitation at [facility] [address] [place], [place], [address] Phone: [phone]   Fax:  [phone]  Patient Details  Name: Karen [name] MRN: [id number] Date of Birth: [birth year] Referring Provider:  Nada Libman, MD  Encounter Date: 12/06/2022   [name], OT 12/06/2022, 3:51 PM  [facility] Outpatient Rehabilitation at [facility] [address] [place], [place], [address] Phone: [phone]   Fax:  [phone]

## 2022-12-08 ENCOUNTER — Ambulatory Visit: Payer: Commercial Managed Care - PPO | Admitting: Occupational Therapy

## 2022-12-08 ENCOUNTER — Ambulatory Visit: Payer: Commercial Managed Care - PPO | Admitting: Allergy & Immunology

## 2022-12-08 ENCOUNTER — Other Ambulatory Visit (HOSPITAL_COMMUNITY): Payer: Self-pay

## 2022-12-08 ENCOUNTER — Encounter: Payer: Self-pay | Admitting: Allergy & Immunology

## 2022-12-08 VITALS — BP 130/90 | HR 97 | Temp 98.2°F | Resp 16 | Ht 67.0 in | Wt 201.7 lb

## 2022-12-08 DIAGNOSIS — R198 Other specified symptoms and signs involving the digestive system and abdomen: Secondary | ICD-10-CM

## 2022-12-08 DIAGNOSIS — J3089 Other allergic rhinitis: Secondary | ICD-10-CM | POA: Diagnosis not present

## 2022-12-08 DIAGNOSIS — B999 Unspecified infectious disease: Secondary | ICD-10-CM | POA: Diagnosis not present

## 2022-12-08 DIAGNOSIS — I890 Lymphedema, not elsewhere classified: Secondary | ICD-10-CM | POA: Diagnosis not present

## 2022-12-08 DIAGNOSIS — R0602 Shortness of breath: Secondary | ICD-10-CM

## 2022-12-08 DIAGNOSIS — R1310 Dysphagia, unspecified: Secondary | ICD-10-CM | POA: Diagnosis not present

## 2022-12-08 DIAGNOSIS — R682 Dry mouth, unspecified: Secondary | ICD-10-CM

## 2022-12-08 MED ORDER — LEVOCETIRIZINE DIHYDROCHLORIDE 5 MG PO TABS
5.0000 mg | ORAL_TABLET | Freq: Every evening | ORAL | 1 refills | Status: DC
  Filled 2022-12-08: qty 90, 90d supply, fill #0
  Filled 2023-05-19: qty 90, 90d supply, fill #1

## 2022-12-08 MED ORDER — MOTEGRITY 2 MG PO TABS
1.0000 | ORAL_TABLET | Freq: Every day | ORAL | 2 refills | Status: DC
  Filled 2022-12-08: qty 90, 90d supply, fill #0

## 2022-12-08 MED ORDER — ALBUTEROL SULFATE HFA 108 (90 BASE) MCG/ACT IN AERS
2.0000 | INHALATION_SPRAY | Freq: Four times a day (QID) | RESPIRATORY_TRACT | 2 refills | Status: AC | PRN
  Filled 2022-12-08: qty 6.7, 25d supply, fill #0

## 2022-12-08 NOTE — Therapy (Signed)
OUTPATIENT OCCUPATIONAL THERAPY TREATMENT NOTE  LOWER EXTREMITY LYMPHEDEMA  Patient Name: Karen [name] MRN: [id number] DOB:16-Dec-1970, 52 y.o., female Today's Date: 12/08/2022  END OF SESSION:   OT End of Session - 12/08/22 1546     Visit Number 7    Number of Visits 36    Date for OT Re-Evaluation 02/08/23    OT Start Time 0100    OT Stop Time 0210    OT Time Calculation (min) 70 min    Activity Tolerance Patient tolerated treatment well;No increased pain    Behavior During Therapy [facility] for tasks assessed/performed               Past Medical History:  Diagnosis Date   Adenomyosis    Anemia    Arthritis 2013   L knee gets steroid injections    Asthmatic bronchitis 01/31/2017   Back pain    Chronic constipation    Diarrhea    DVT of lower extremity (deep venous thrombosis) (HCC)    Dysmenorrhea    Endometriosis    Esophageal stricture    GERD (gastroesophageal reflux disease)    History of kidney stones    History of uterine fibroid    IBS (irritable bowel syndrome)    Interstitial cystitis    Lactose intolerance    Migraine    with aura   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   PONV (postoperative nausea and vomiting)    Recurrent upper respiratory infection (URI)    Sebaceous cyst of breast, right lower inner quadrant 10/25/2012   Excised 11/21/12    Sleep apnea    Syncope, non cardiac    Past Surgical History:  Procedure Laterality Date   ARTHROSCOPIC HAGLUNDS REPAIR     BREAST CYST EXCISION Right 11/21/2012   Procedure: CYST EXCISION BREAST;  Surgeon: Currie Paris, MD;  Location: [facility];  Service: General;  Laterality: Right;   BREAST CYST EXCISION Bilateral 01/18/2022   Procedure: EXCISION OF SEBACEOUS CYST BILATERAL BREAST;  Surgeon: Harriette Bouillon, MD;  Location: [facility];  Service: General;  Laterality: Bilateral;   BREAST EXCISIONAL BIOPSY Right 11/2012   CESAREAN SECTION  04/19/1993   CHOLECYSTECTOMY      COLONOSCOPY  05/02/2011   Procedure: COLONOSCOPY;  Surgeon: Vertell Novak., MD;  Location: [facility];  Service: Endoscopy;  Laterality: N/A;   colonoscopy  11/04/2014   DENTAL SURGERY  03/06/2018   2 surgeries ( 03/06/2018 and 04/06/2018)   to remove two separate benign tumors   ESOPHAGEAL MANOMETRY N/A 11/04/2015   Procedure: ESOPHAGEAL MANOMETRY (EM);  Surgeon: Carman Ching, MD;  Location: [facility];  Service: Endoscopy;  Laterality: N/A;   ESOPHAGOGASTRODUODENOSCOPY  05/02/2011   Procedure: ESOPHAGOGASTRODUODENOSCOPY (EGD);  Surgeon: Vertell Novak., MD;  Location: [facility];  Service: Endoscopy;  Laterality: N/A;   ESOPHAGOGASTRODUODENOSCOPY N/A 09/01/2014   Procedure: ESOPHAGOGASTRODUODENOSCOPY (EGD);  Surgeon: Carman Ching, MD;  Location: [facility];  Service: Endoscopy;  Laterality: N/A;   KNEE SURGERY     LAPAROSCOPY     x 4   LESION REMOVAL N/A 01/18/2022   Procedure: EXCISION OF SEBACEOUS CYSTS CHEST AND NECK;  Surgeon: Harriette Bouillon, MD;  Location: [facility];  Service: General;  Laterality: N/A;   PH IMPEDANCE STUDY N/A 11/04/2015   Procedure: PH IMPEDANCE STUDY;  Surgeon: Carman Ching, MD;  Location: [facility];  Service: Endoscopy;  Laterality: N/A;   VAGINAL HYSTERECTOMY  2010   TVH--ovaries remain   Patient  Active Problem List   Diagnosis Date Noted   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   Elevated LFTs 04/04/2021   Colitis 04/04/2021   Bacterial overgrowth syndrome 05/21/2020   Obesity 05/21/2020   History of endometriosis 05/21/2020   Constipation due to outlet dysfunction 02/05/2020   Gastroesophageal reflux disease 02/05/2020   Obstructive sleep apnea treated with continuous positive airway pressure (CPAP) 06/16/2017   Perennial allergic rhinitis with a nonallergic component 01/31/2017   Asthmatic bronchitis 01/31/2017   GI symptoms 01/31/2017   Family history of colon cancer 01/27/2015   Chest pain 12/13/2012   Dyspnea  12/13/2012   DVT of lower extremity (deep venous thrombosis) (HCC) 01/03/1990    PCP: Coral Else, MD  REFERRING PROVIDER: Lorenda Ishihara, MD  REFERRING DIAG: I89.0  THERAPY DIAG:  Lymphedema, not elsewhere classified  Rationale for Evaluation and Treatment: Rehabilitation  ONSET DATE: ~2014; exacerbation in 2023  SUBJECTIVE:                                                                                                                                                                                           SUBJECTIVE STATEMENT: Karen [name] returns to OT for Rx visit for BLE/BLQ lymphedema care. Pt denies lymphedema related leg pain. She reports increased feeling of heaviness in her gut. Pt states she has abdominal xray pending  PERTINENT HISTORY: CVI, OSA (no CPAP)  PAIN:  Are you having pain? Yes: NPRS scale: not rated/10 Pain location: BLE Pain description: tight, heavy, tingling, numbness Aggravating factors: standing, walking, dependent sitting > 30 minutes Relieving factors: elevation  PRECAUTIONS: Other: LYMPHEDEMA; Hx LLE DVT  RED FLAGS: Hx LLE DVT  WEIGHT BEARING RESTRICTIONS: No  FALLS:  Has patient fallen in last 6 months? No  LIVING ENVIRONMENT: Lives with: lives with spouse and daughter Lives in: House/apartment Stairs: Yes; Internal: 14 steps; yes and External: 4 steps; yes Has following equipment at home: None  OCCUPATION: [employer] operational officer, walking, desk work  LEISURE: family time  HAND DOMINANCE: right   PRIOR LEVEL OF FUNCTION: Independent  PATIENT GOALS: Feel better, be able to be more active without pain, wear preferred street shoes  OBJECTIVE: Note: Objective measures were completed at Evaluation unless otherwise noted.  COGNITION:  Overall cognitive status: Within functional limits for tasks assessed   OBSERVATIONS / OTHER ASSESSMENTS:   POSTURE: WFL  LE ROM: WFL  LE MMT: WFL  LYMPHEDEMA ASSESSMENTS: non-Ca  related  INFECTIONS: denies cellulitis and wound hx  BLE COMPARATIVE LIMB VOLUMETRICS Initial 11/15/22  LANDMARK RIGHT (dominant)  R LEG (A-D) 3028.9 ml  R THIGH (E-G) 4809.60 ml  R FULL LIMB (A-G) 7838.5 ml  Limb Volume differential (LVD)  %  Volume change since initial %  Volume change overall V  (Blank rows = not tested)  LANDMARK LEFT  (Rx)  L LEG (A-D) 3189.9 ml  L THIGH (E-G) 4959.8 ml  L FULL LIMB (A-G) 8149.7 ml  Limb Volume differential (LVD)  LEG LVD = 5.32%, L>R; THIGH LVD = 3.12%, L>R; And full LLE LVD = 3.97%, L>R. 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 Obesity  Skin  Description Hyper-Keratosis Peau d' Orange Shiny Tight Fibrotic/ Indurated Fatty Doughy Spongy/ boggy       R>L x  x   Skin dry Flaky WNL Macerated   mildly      Color Redness Varicosities Blanching Hemosiderin Stain Mottled        x   Odor Malodorous Yeast Fungal infection  WNL      x   Temperature Warm Cool wnl     x    Pitting Edema   1+ 2+ 3+ 4+ Non-pitting         x   Girth Symmetrical Asymmetrical                   Distribution    L>R toes to groin    Stemmer Sign Positive Negative   +L -R   Lymphorrhea History Of:  Present Absent     x    Wounds History Of Present Absent Venous Arterial Pressure Sheer     x        Signs of Infection Redness Warmth Erythema Acute Swelling Drainage Borders                    Sensation Light Touch Deep pressure Hypersensitivity   In tact Impaired In tact Impaired Absent Impaired   x  x  x     Nails WNL   Fungus nail dystrophy   x     Hair Growth Symmetrical Asymmetrical   x    Skin Creases Base of toes  Ankles   Base of Fingers knees       Abdominal pannus Thigh Lobules  Face/neck   x           GAIT: Distance walked: >500' Assistive device utilized: None Level of assistance: Complete  Independence Comments: Functional ambulation and transfers [facility]  LYMPHEDEMA LIFE IMPACT SCALE (LLIS): Initial 11/15/22  50%  FOTO functional outcome measure: TBA initial Rx visit 2/2 time constraints  TODAY'S TREATMENT:                                                                                                                                         MLD to LLE/LLQ Pt edu re lymphatic structure and function, simple self MLD   PATIENT EDUCATION:  Continued Pt/ CG edu for lymphedema self care home program throughout session. Topics include outcome of comparative limb volumetrics- starting limb volume differentials (LVDs), technology and gradient techniques used for short stretch, multilayer compression wrapping, simple self-MLD, therapeutic lymphatic pumping exercises, skin/nail care, LE precautions,. compression garment recommendations and specifications, wear and care schedule and compression garment donning / doffing w assistive devices. Discussed progress towards all OT goals since commencing CDT. All questions answered to the Pt's satisfaction. Good return. Person educated: Patient  Education method: Explanation, Demonstration, and Handouts Education comprehension: verbalized understanding, returned demonstration, verbal cues required, and needs further education  HOME EXERCISE PROGRAM: BLE lymphatic pumping there ex using- 1 sets of 10 reps, each exercise in order-  1-2 x daily, bilaterally Simple self MLD 1 x daily Daily skin care to increase hydration, skin mobility and decrease infection risk- can be done during MLD Compression wraps 23/7 during intensive phase of CDT Compression garments/ devices during self-management phase of CDT  ASSESSMENT:  CLINICAL IMPRESSION:  Continued MLD to LLE/LLQ utilizing functional inguinal pathway, deep lymphatics, and short neck sequence.Pt tolerated stimulation to deep abdominal lymphatics and ascending, transverse and descending colon without  increased pain. Completed measurements for custom Elvarex SOFT compression knee highs, ccl 2 and sent to vendor after session. Cont as per POC.   11/15/22 Initial Evaluation: Karen [name] is a 52 y.o. female presenting with very mild, stage II, LLE lymphedema 2/2 venous insufficiency and obesity. L Leg swelling and associated pain swelling fluctuates. It has progressed over time, and now no longer resolves with elevation over night. RLE swelling limits balance during functional ambulation. It exacerbates infection and falls risk. LLE/RLQ lymphedema interferes with functional performance in all occupational domains, including basic and instrumental ADLs, productive activities, leisure pursuits and social participation. Pt will benefit from Occupational Therapy for Complete Decongestive Therapy (CDT) to restore function, to reduce physical and psychologic suffering associated with chronic, progressive lymphedema and associated pain, and to limit infection. CDT will include manual lymphatic drainage (MLD), skin care, therapeutic exercise and compression wraps and garment's devices. Without skilled OT for lymphedema care the condition will worsen and further functional decline is expected  Custom-made gradient compression garments and HOS devices are medically necessary because they are uniquely sized and shaped to fit the exact dimensions of the affected extremities, and to provide appropriate medical grade, graduated compression essential for optimally managing chronic, progressive lymphedema. Multiple custom compression garments are needed to ensure proper hygiene to limit infection risk. Custom compression garments should be replaced q 3-6 months When worn consistently for optimal lipo-lymphedema self-management over time. HOS devices, medically necessary to limit fibrosis buildup in tissue, should be replaced q 2 years and PRN when worn out.      OBJECTIVE IMPAIRMENTS: decreased activity tolerance,  decreased knowledge of condition, decreased knowledge of use of DME, increased edema, impaired sensation, pain, and chronic, progressive leg swelling .   ACTIVITY LIMITATIONS: dependent sitting, extended standing and/ or walking, squatting, and lower body dressing, fitting preferred street shoes  PARTICIPATION LIMITATIONS: shopping, community activity, occupation, and yard work  PERSONAL FACTORS: 3+ comorbidities: Hx DVT,  OSA, CVI. Varicose veins  are also affecting patient's functional outcome.   REHAB POTENTIAL: Good  EVALUATION COMPLEXITY: Moderate   GOALS: Goals reviewed with patient? Yes  SHORT TERM GOALS: Target date: 4th OT Rx visit  SHORT TERM GOALS: Target date: 4th OT Rx visit   Pt will demonstrate understanding of lymphedema precautions and prevention strategies with modified independence using  a printed reference to identify at least 5 precautions and discussing how s/he may implement them into daily life to reduce risk of progression with extra time. Baseline:Max A Goal status: INITIAL   Pt will be able to apply multilayer, knee length, gradient, compression wraps to one leg at a time with modified assistance (extra time and assistive device/s) to decrease limb volume, to limit infection risk, and to limit lymphedema progression.  Baseline: Dependent Goal status: INITIAL  LONG TERM GOALS: Target date: 02/08/23  Given this patient's Intake score of TBA /100% on the functional outcomes FOTO tool, patient will experience an increase in function of 5 points to improve basic and instrumental ADLs performance, including lymphedema self-care.  Baseline: Max A Goal status: INITIAL   Given this patient's Intake score of TBA% on the Lymphedema Life Impact Scale (LLIS), patient will experience a reduction of at least 5 points in her perceived level of functional impairment resulting from lymphedema to improve functional performance and quality of life (QOL). Baseline: 64.71% Goal  status: INITIAL  3 Pt will achieve at least a 10% volume reduction in B legs to return limb to typical size and shape, to limit infection risk and LE progression, to decrease pain, to improve function. Baseline: Dependent Goal status: INITIAL  4.  Pt will obtain proper compression garments/devices and achieve modified independence (extra time + assistive devices) with donning/doffing to optimize limb volume reductions and limit LE  progression over time. Baseline:  Goal status: INITIAL  5.  During Intensive phase CDT , with modified independence, Pt will achieve at least 85% compliance with all lymphedema self-care home program components, including daily skin care, compression wraps and /or garments, simple self MLD and lymphatic pumping therex to habituate LE self care protocol  into ADLs for optimal LE self-management over time. Baseline: Dependent Goal status: INITIAL   PLAN:  PT FREQUENCY: 2x/week  PT DURATION: 12 weeks  PLANNED INTERVENTIONS: 97110-Therapeutic exercises, 97530- Therapeutic activity, 97535- Self Care, 29562- Manual therapy, Patient/Family education, Manual lymph drainage, Compression bandaging, and fit with appropriate compression garments  PLAN FOR NEXT SESSION:  BLE comparative limb volumetrics Pt edu for LE self care  Loel Dubonnet, MS, OTR/L, CLT-LANA 12/08/22 3:48 PM '

## 2022-12-08 NOTE — Progress Notes (Signed)
FOLLOW UP  Date of Service/Encounter:  12/08/22   Assessment:   SOB (shortness of breath) - largely resolved   3mm RML lung nodule (benign per imaging guidelines)   Diarrhea - with a history of SIBO s/p treatment by Dr. 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 Langston Reusing in January 2025  Plan/Recommendations:      Vulvar Dermatitis - Persistent vulvar irritation and tissue breakdown despite multiple topical treatments.  - Currently using Aquaphor and Kenalog.  - Dermatology referral pending with appointment in January 2025 with Dr. Onalee Hua - Continue current topical treatments. - Follow up with dermatology specialist.  Small Intestinal Bacterial Overgrowth (SIBO) - Recent treatment with two weeks of antibiotics.  - Concurrent yeast infection treated with two rounds of Diflucan. - Continue current treatment plan.  Eosinophilic Esophagitis (EOE) - Recent increase in Prevacid. - Symptoms of dysphagia and nocturnal coughing reported. - Continue current treatment plan.  Rheumatoid Arthritis (RA) / Hemochromatosis Recent development of a nodule on the hand. Elevated ESR and CRP. Pending urinalysis results. Possible initiation of Plaquenil pending further results. -Follow up with rheumatology in March.  Constipation/Diarrhea - unclear etiology - Ongoing issues contributing to vulvar irritation.  - Consider further GI evaluation.  Asthma - Lung testing looks stable. - We are not going to make any changes at this point in time.  - You can continue with the albuterol 2 puffs every 4-6 hours. - The prednisone is going to help with this as well.   Lymphedema  - Recent diagnosis with ongoing therapy.  -  Noted improvement noted in leg swelling. -Continue current therapy.  Recurrent sinusitis  - Continue with Flonase one spray per nostril daily.  - We will obtain some screening labs to evaluate your immune system.  - Labs to evaluate the quantitative [facility]) aspects of your immune system: IgG/IgA/IgM, CBC with differential - Labs to evaluate the qualitative (HOW WELL THEY WORK) aspects of your immune system: CH50, Pneumococcal titers, Tetanus titers, Diphtheria titers - We may consider immunizations with Pneumovax and Tdap to challenge your immune system, and then obtain repeat titers in 4-6 weeks.      Subjective:   Karen [name] is a 52 y.o. female presenting today for follow up of  Chief Complaint  Patient presents with   Allergic Rhinitis    Asthma    No issues, but was having chest pain with numbness and tingling on the right side of her face, headache.  She went to the ER and everything checked out okay.     [name] has a history of the following: Patient Active Problem List   Diagnosis Date Noted   Sjogren syndrome with keratoconjunctivitis (HCC) 12/09/2022   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   Elevated LFTs 04/04/2021   Colitis 04/04/2021   Bacterial overgrowth syndrome 05/21/2020   Obesity 05/21/2020   History of endometriosis 05/21/2020   Constipation due to outlet dysfunction 02/05/2020   Gastroesophageal reflux disease 02/05/2020   Obstructive sleep apnea treated with continuous positive airway pressure (CPAP) 06/16/2017   Perennial allergic rhinitis with a nonallergic component 01/31/2017   Asthmatic bronchitis 01/31/2017   GI symptoms 01/31/2017   Family history of colon  cancer 01/27/2015   Chest pain 12/13/2012   Dyspnea 12/13/2012   DVT of lower extremity (deep venous thrombosis) (HCC) 01/03/1990    History obtained from: chart review and patient.  Discussed the use of AI scribe software for clinical note transcription with the patient and/or  guardian, who gave verbal consent to proceed.  Karen [name] is a 52 y.o. female presenting for a follow up visit.  She was last seen in June 2024.  At that time, she was doing well from atopic perspective.  She continued to follow with her gastroenterologist who is managing her symptoms very well.  We ordered a CRP and an abdominal x-ray. CRP was normal.  X-ray showed quite a bit of stool throughout the colon. She was diagnosed with SIBO once again and had two more weeks of the antibiotics for this.  Since the last visit, she has done well.   The patient presents with a complex medical history, including gastrointestinal issues, skin conditions, and potential rheumatoid arthritis. She reports a recent diagnosis of Small Intestinal Bacterial Overgrowth (SIBO), for which she underwent two weeks of antibiotic treatment. However, she experienced symptoms suggestive of a yeast infection during this period, necessitating two rounds of Diflucan.  The patient also reports persistent lower gastrointestinal discomfort, with alternating diarrhea and constipation. She underwent a biopsy for this issue, which was inconclusive, suggesting either a lichen complex or a drug reaction. She has been treated with various ointments, including a steroid ointment and Kenalog, but reports minimal improvement. She is currently using Aquaphor and a nonsteroidal ointment, which she reports exacerbated her symptoms.  In addition to these issues, the patient has been diagnosed with Sjogren's syndrome, for which she is taking methotrexate. She is scheduled to see Dr. Langston Reusing in January 2025t. She also reports a knot on her hand, which is being investigated for potential rheumatoid arthritis or a complication of her hemochromatosis. She is scheduled for a follow-up with her rheumatologist in three months.  The patient's gastrointestinal symptoms persist, including nausea and vomiting. She has been diagnosed with Eosinophilic  Esophagitis (EOE), for which she is taking 30mg . She reports ongoing dysphagia. She also has a history of asthma, which is reportedly under control, although she had a severe cold in July.  The patient has had multiple cysts removed from her chest, which were identified as sebaceous cysts. She is scheduled for a follow-up due to the development of another potential cyst. She also reports lymphedema in her right leg, possibly related to a previous blood clot. She is currently undergoing therapy for this issue.  The patient's complex medical history and ongoing symptoms have resulted in frequent absences from work. She expresses frustration with the lack of a clear diagnosis and treatment plan for her gastrointestinal and skin issues. Thankfully she is able to work at least part time from home, which is good because she has chronic issues with diarrhea.     She had two rounds of Diflucan and she felt that she had yeast infections. She had a biopsy of her vulva. 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 She was treated with a topical ointment first. This was a steroid containing ointment. This was weaned off, but she did not notice a difference anyway. She had the ointment changed multiple times and was on triamcinolone ointment for a bit. She was changing to a non-steroidal that "lit" her up. She is using Aquaphor and lidocaine and nifedipine. She has fissures now. She is going between  the Dermatologist and whas been referred to a vulvar specialist. This appointment is in the spring 2025.   She continues to have the alternating constipation diarrhea. She continues to follow with Dr. Lennox Laity to pursue a cystoscopy to look at her bladder.   She did go back to Dr. Nickola Major and did a large rheumatologic workup. Evidently they atre thinking Plaquenil, but this is holindg until her next appointment. She has an appointment in March 2024.   She had a biopsy on 08/22/2022 showing chronic  inflammation and slight hyperkeratosis. Negative for dysplasia or malignancy.   Otherwise, there have been no changes to her past medical history, surgical history, family history, or social history.    Review of systems otherwise negative other than that mentioned in the HPI.    Objective:   Blood pressure (!) 130/90, pulse 97, temperature 98.2 F (36.8 C), temperature source Temporal, resp. rate 16, height 5\' 7"  (1.702 m), weight 201 lb 11.2 oz (91.5 kg), SpO2 98%. Body mass index is 31.59 kg/m.    Physical Exam Vitals reviewed.  Constitutional:      Appearance: Normal appearance. She is well-developed.  HENT:     Head: Normocephalic and atraumatic.     Right Ear: Tympanic membrane, ear canal and external ear normal. No drainage, swelling or tenderness. Tympanic membrane is not injected, scarred, erythematous, retracted or bulging.     Left Ear: Tympanic membrane, ear canal and external ear normal. No drainage, swelling or tenderness. Tympanic membrane is not injected, scarred, erythematous, retracted or bulging.     Nose: No nasal deformity, septal deviation, mucosal edema or rhinorrhea.     Right Turbinates: Enlarged, swollen and pale.     Left Turbinates: Enlarged, swollen and pale.     Right Sinus: No maxillary sinus tenderness or frontal sinus tenderness.     Left Sinus: No maxillary sinus tenderness or frontal sinus tenderness.     Comments: No polyps noted.    Mouth/Throat:     Lips: Pink.     Mouth: Mucous membranes are moist. Mucous membranes are not pale and not dry.     Pharynx: Uvula midline.     Comments: Cobblestoning minimally present.  Eyes:     General:        Right eye: No discharge.        Left eye: No discharge.     Conjunctiva/sclera: Conjunctivae normal.     Right eye: Right conjunctiva is not injected. No chemosis.    Left eye: Left conjunctiva is not injected. No chemosis.    Pupils: Pupils are equal, round, and reactive to light.   Cardiovascular:     Rate and Rhythm: Normal rate and regular rhythm.     Heart sounds: Normal heart sounds.  Pulmonary:     Effort: Pulmonary effort is normal. No tachypnea, accessory muscle usage or respiratory distress.     Breath sounds: Normal breath sounds. No wheezing, rhonchi or rales.     Comments: Moving air well in all lung fields. No increased work of breathing noted.  Chest:     Chest wall: No tenderness.  Abdominal:     Tenderness: There is no abdominal tenderness. There is no guarding or rebound.  Lymphadenopathy:     Head:     Right side of head: No submandibular, tonsillar or occipital adenopathy.     Left side of head: No submandibular, tonsillar or occipital adenopathy.     Cervical: No cervical adenopathy.  Skin:  General: Skin is warm.     Capillary Refill: Capillary refill takes less than 2 seconds.     Coloration: Skin is not pale.     Findings: No abrasion, erythema, petechiae or rash. Rash is not papular, urticarial or vesicular.     Comments: No eczematous or urticarial lesions noted.   Neurological:     Mental Status: She is alert.  Psychiatric:        Behavior: Behavior is cooperative.      Diagnostic studies:    Spirometry: results normal (FEV1: 2.01/80%, FVC: 2.65/84%, FEV1/FVC: 76%).    Spirometry consistent with normal pattern.   Allergy Studies: none       Malachi Bonds, MD  [facility]

## 2022-12-08 NOTE — Patient Instructions (Addendum)
1. SOB (shortness of breath) - Lung testing looks stable. - We are not going to make any changes at this point in time.  - You can continue with the albuterol 2 puffs every 4-6 hours. - The prednisone is going to help with this as well.   2. Dysphagia - Continue with the Prevacid 30mg  daily.  - I will send my note to Dr. Wellington Hampshire. - I would bet that you are going to end up on Dupixent since you are still having symptoms even with the Prevacid twice daily.  - I might consider a trial of the Dupixent just to see if it would help with the GI issues and the itching. - We should know after a month or two whether this is working.   3. Recurrent sinusitis  - Continue with Flonase one spray per nostril daily.  - We will obtain some screening labs to evaluate your immune system.  - Labs to evaluate the quantitative [facility]) aspects of your immune system: IgG/IgA/IgM, CBC with differential - Labs to evaluate the qualitative (HOW WELL THEY WORK) aspects of your immune system: CH50, Pneumococcal titers, Tetanus titers, Diphtheria titers - We may consider immunizations with Pneumovax and Tdap to challenge your immune system, and then obtain repeat titers in 4-6 weeks.   4. Return in about 3 months (around 03/08/2023).    Please inform us of any Emergency Department visits, hospitalizations, or changes in symptoms. Call us before going to the ED for breathing or allergy symptoms since we might be able to fit you in for a sick visit. Feel free to contact us anytime with any questions, problems, or concerns.  It was a pleasure to see you today!  Websites that have reliable patient information: 1. American Academy of Asthma, Allergy, and Immunology: www.aaaai.org 2. Food Allergy Research and Education (FARE): foodallergy.org 3. Mothers of Asthmatics: http://www.asthmacommunitynetwork.org 4. American College of Allergy, Asthma, and Immunology: www.acaai.org   COVID-19 Vaccine Information can be  found at: [email or web address] For questions related to vaccine distribution or appointments, please email [email or web address] or call [phone].   We realize that you might be concerned about having an allergic reaction to the COVID19 vaccines. To help with that concern, WE ARE OFFERING THE COVID19 VACCINES IN OUR OFFICE! Ask the front desk for dates!     "Like" [place] on Facebook and Instagram for our latest updates!      A healthy democracy works best when [employer] participate! Make sure you are registered to vote! If you have moved or changed any of your contact information, you will need to get this updated before voting!  In some cases, you MAY be able to register to vote online: [email or web address]

## 2022-12-08 NOTE — Procedures (Signed)
Karen [name]  HOME SLEEP TEST (SANSA) REPORT (Mail-Out Device):   STUDY DATE: 12/03/2022   DOB: [birth year]  MRN: [id number]  ORDERING CLINICIAN: Huston Foley, MD, PhD   REFERRING CLINICIAN: ER  CLINICAL INFORMATION/HISTORY: 52 year old female with an underlying medical history of anemia, asthma, DVT, endometriosis, reflux disease, kidney stones, irritable bowel syndrome, interstitial cystitis, migraine headaches, arthritis, back pain, history of syncope, mild obstructive sleep apnea (not on PAP therapy) and mild obesity, who presents for evaluation of her prior diagnosis of obstructive sleep apnea.     BMI (at the time of sleep clinic visit and/or test date): 31 kg/m  FINDINGS:   Study Protocol:    The SANSA single-point-of-skin-contact chest-worn sensor - an FDA and DOT approved type 4 home sleep test device - measures eight physiological channels,  including blood oxygen saturation (measured via PPG [photoplethysmography]), EKG-derived heart rate, respiratory effort, chest movement (measured via accelerometer), snoring, body position, and actigraphy. The device is designed to be worn for up to 10 hours per study.   Sleep Summary:   Total Recording Time (hours, min): 7 hours, 1 min  Total Sleep Time (hours, min):  3 hours, 17 min  Sleep Efficiency (%):    47%   Respiratory Indices:   Calculated sAHI (per hour):  9.2/hour         Oxygen Saturation Statistics:    Oxygen Saturation (%) Mean: 93.5%   Minimum oxygen saturation (%):                 80.2%   O2 Saturation Range (%): 80.2-97.7%    Pulse Rate Statistics:   Pulse Mean (bpm):    71/min    Pulse Range (54-126/min)   Snoring: Intermittent, mild to moderate  IMPRESSION/DIAGNOSES:   OSA (obstructive sleep apnea)   RECOMMENDATIONS:   This home sleep test demonstrates overall mild obstructive sleep apnea with a total AHI of 9.2/hour and O2 nadir of 80.2%. Snoring was detected, and  appeared to be intermittent in the mild to moderate range.  Treatment options for mild obstructive sleep apnea include therapy with a positive airway pressure device, such as home autoPAP trial/titration. A full night, in-lab PAP titration study may aid in improving proper treatment settings and with mask fit, if needed, down the road. Alternative treatments may include weight loss (where appropriate) along with avoidance of the supine sleep position (if possible), or an oral appliance in appropriate candidates.  These different treatment avenues will be discussed with the patient.  She had previously been on AutoPap therapy several years ago. Please note that untreated obstructive sleep apnea may carry additional perioperative morbidity. Patients with significant obstructive sleep apnea should receive perioperative PAP therapy and the surgeons and particularly the anesthesiologist should be informed of the diagnosis and the severity of the sleep disordered breathing. The patient should be cautioned not to drive, work at heights, or operate dangerous or heavy equipment when tired or sleepy. Review and reiteration of good sleep hygiene measures should be pursued with any patient. Other causes of the patient's symptoms, including circadian rhythm disturbances, an underlying mood disorder, medication effect and/or an underlying medical problem cannot be ruled out based on this test. Clinical correlation is recommended.  The patient and her referring provider will be notified of the test results. The patient will be seen in follow up in sleep clinic at [facility], as necessary.  I certify that I have reviewed the raw data recording prior to the issuance of this report in  accordance with the standards of the American Academy of Sleep Medicine (AASM).    INTERPRETING PHYSICIAN:   Huston Foley, MD, PhD Medical Director, [name]) Diplomat, ABPN (Neurology and Sleep)    [name] [address], [address] [place], [place] [address] [phone]

## 2022-12-09 ENCOUNTER — Ambulatory Visit (INDEPENDENT_AMBULATORY_CARE_PROVIDER_SITE_OTHER): Payer: Commercial Managed Care - PPO | Admitting: Podiatry

## 2022-12-09 ENCOUNTER — Other Ambulatory Visit: Payer: Self-pay | Admitting: Podiatry

## 2022-12-09 ENCOUNTER — Ambulatory Visit (INDEPENDENT_AMBULATORY_CARE_PROVIDER_SITE_OTHER): Payer: Commercial Managed Care - PPO

## 2022-12-09 ENCOUNTER — Encounter: Payer: Self-pay | Admitting: Podiatry

## 2022-12-09 ENCOUNTER — Inpatient Hospital Stay: Payer: Commercial Managed Care - PPO | Attending: Hematology & Oncology

## 2022-12-09 DIAGNOSIS — M778 Other enthesopathies, not elsewhere classified: Secondary | ICD-10-CM

## 2022-12-09 DIAGNOSIS — M2042 Other hammer toe(s) (acquired), left foot: Secondary | ICD-10-CM

## 2022-12-09 DIAGNOSIS — M2041 Other hammer toe(s) (acquired), right foot: Secondary | ICD-10-CM

## 2022-12-09 DIAGNOSIS — K5900 Constipation, unspecified: Secondary | ICD-10-CM | POA: Diagnosis not present

## 2022-12-09 DIAGNOSIS — M3501 Sicca syndrome with keratoconjunctivitis: Secondary | ICD-10-CM | POA: Insufficient documentation

## 2022-12-09 LAB — CBC WITH DIFFERENTIAL (CANCER CENTER ONLY)
Abs Immature Granulocytes: 0.01 10*3/uL (ref 0.00–0.07)
Basophils Absolute: 0 10*3/uL (ref 0.0–0.1)
Basophils Relative: 1 %
Eosinophils Absolute: 0.3 10*3/uL (ref 0.0–0.5)
Eosinophils Relative: 5 %
HCT: 37.2 % (ref 36.0–46.0)
Hemoglobin: 12.1 g/dL (ref 12.0–15.0)
Immature Granulocytes: 0 %
Lymphocytes Relative: 27 %
Lymphs Abs: 1.5 10*3/uL (ref 0.7–4.0)
MCH: 32.3 pg (ref 26.0–34.0)
MCHC: 32.5 g/dL (ref 30.0–36.0)
MCV: 99.2 fL (ref 80.0–100.0)
Monocytes Absolute: 0.5 10*3/uL (ref 0.1–1.0)
Monocytes Relative: 8 %
Neutro Abs: 3.2 10*3/uL (ref 1.7–7.7)
Neutrophils Relative %: 59 %
Platelet Count: 211 10*3/uL (ref 150–400)
RBC: 3.75 MIL/uL — ABNORMAL LOW (ref 3.87–5.11)
RDW: 12.4 % (ref 11.5–15.5)
WBC Count: 5.5 10*3/uL (ref 4.0–10.5)
nRBC: 0 % (ref 0.0–0.2)

## 2022-12-09 LAB — CMP (CANCER CENTER ONLY)
ALT: 12 U/L (ref 0–44)
AST: 13 U/L — ABNORMAL LOW (ref 15–41)
Albumin: 4.2 g/dL (ref 3.5–5.0)
Alkaline Phosphatase: 96 U/L (ref 38–126)
Anion gap: 6 (ref 5–15)
BUN: 10 mg/dL (ref 6–20)
CO2: 30 mmol/L (ref 22–32)
Calcium: 9.8 mg/dL (ref 8.9–10.3)
Chloride: 103 mmol/L (ref 98–111)
Creatinine: 0.67 mg/dL (ref 0.44–1.00)
GFR, Estimated: >60 mL/min (ref >60)
Glucose, Bld: 90 mg/dL (ref 70–99)
Potassium: 4.3 mmol/L (ref 3.5–5.1)
Sodium: 139 mmol/L (ref 135–145)
Total Bilirubin: 0.6 mg/dL (ref <1.2)
Total Protein: 7.6 g/dL (ref 6.5–8.1)

## 2022-12-09 LAB — FERRITIN: Ferritin: 300 ng/mL (ref 11–307)

## 2022-12-09 MED ORDER — TRIAMCINOLONE ACETONIDE 10 MG/ML IJ SUSP
10.0000 mg | Freq: Once | INTRAMUSCULAR | Status: AC
  Administered 2022-12-09: 10 mg via INTRA_ARTICULAR

## 2022-12-11 NOTE — Progress Notes (Signed)
Subjective:   Patient ID: Karen [name], female   DOB: 52 y.o.   MRN: [id number]   HPI Patient states she has a lot of pain in the fifth digits right over left little toes and it has been going on for a long time.  She is uncomfortable in shoes and she is also being treated for some swelling left leg with diagnosis of lymphedema.  Patient does not smoke likes to be active   Review of Systems  All other systems reviewed and are negative.       Objective:  Physical Exam Vitals and nursing note reviewed.  Constitutional:      Appearance: She is well-developed.  Pulmonary:     Effort: Pulmonary effort is normal.  Musculoskeletal:        General: Normal range of motion.  Skin:    General: Skin is warm.  Neurological:     Mental Status: She is alert.     Neurovascular status was found to be intact muscle strength was found to be adequate rotation of the fifth digit both feet with inflammation fluid around the proximal to phalangeal joint with pain and keratotic tissue.  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  Today I went ahead I did sterile prep and I injected the inner phalangeal joint of each big toe 3 mg dexamethasone Kenalog 3 mg Xylocaine debrided lesions applied cushioning's and discussed possible arthroplasty.  Reappoint to recheck as symptoms indicate  X-rays indicate rotation fifth digit bilateral moderately significant no other pathology

## 2022-12-12 ENCOUNTER — Encounter: Payer: Self-pay | Admitting: *Deleted

## 2022-12-12 LAB — IRON AND IRON BINDING CAPACITY (CC-WL,HP ONLY)
Iron: 60 ug/dL (ref 28–170)
Saturation Ratios: 20 % (ref 10.4–31.8)
TIBC: 297 ug/dL (ref 250–450)
UIBC: 237 ug/dL (ref 148–442)

## 2022-12-13 ENCOUNTER — Ambulatory Visit: Payer: Commercial Managed Care - PPO | Admitting: Occupational Therapy

## 2022-12-13 DIAGNOSIS — I890 Lymphedema, not elsewhere classified: Secondary | ICD-10-CM | POA: Diagnosis not present

## 2022-12-13 DIAGNOSIS — N94818 Other vulvodynia: Secondary | ICD-10-CM | POA: Diagnosis not present

## 2022-12-13 NOTE — Therapy (Signed)
OUTPATIENT OCCUPATIONAL THERAPY TREATMENT NOTE  LOWER EXTREMITY LYMPHEDEMA  Patient Name: Karen [name] MRN: [id number] DOB:1970-06-09, 52 y.o., female Today's Date: 12/13/2022  END OF SESSION:   OT End of Session - 12/13/22 1242     Visit Number 8    Number of Visits 36    Date for OT Re-Evaluation 02/08/23    OT Start Time 1118    OT Stop Time 1205    OT Time Calculation (min) 47 min    Activity Tolerance Patient tolerated treatment well;No increased pain    Behavior During Therapy [facility] for tasks assessed/performed               Past Medical History:  Diagnosis Date   Adenomyosis    Anemia    Arthritis 2013   L knee gets steroid injections    Asthmatic bronchitis 01/31/2017   Back pain    Chronic constipation    Diarrhea    DVT of lower extremity (deep venous thrombosis) (HCC)    Dysmenorrhea    Endometriosis    Esophageal stricture    GERD (gastroesophageal reflux disease)    History of kidney stones    History of uterine fibroid    IBS (irritable bowel syndrome)    Interstitial cystitis    Lactose intolerance    Migraine    with aura   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   PONV (postoperative nausea and vomiting)    Recurrent upper respiratory infection (URI)    Sebaceous cyst of breast, right lower inner quadrant 10/25/2012   Excised 11/21/12    Sleep apnea    Syncope, non cardiac    Past Surgical History:  Procedure Laterality Date   ARTHROSCOPIC HAGLUNDS REPAIR     BREAST CYST EXCISION Right 11/21/2012   Procedure: CYST EXCISION BREAST;  Surgeon: Currie Paris, MD;  Location: [facility];  Service: General;  Laterality: Right;   BREAST CYST EXCISION Bilateral 01/18/2022   Procedure: EXCISION OF SEBACEOUS CYST BILATERAL BREAST;  Surgeon: Harriette Bouillon, MD;  Location: [facility];  Service: General;  Laterality: Bilateral;   BREAST EXCISIONAL BIOPSY Right 11/2012   CESAREAN SECTION  04/19/1993   CHOLECYSTECTOMY      COLONOSCOPY  05/02/2011   Procedure: COLONOSCOPY;  Surgeon: Vertell Novak., MD;  Location: [facility];  Service: Endoscopy;  Laterality: N/A;   colonoscopy  11/04/2014   DENTAL SURGERY  03/06/2018   2 surgeries ( 03/06/2018 and 04/06/2018)   to remove two separate benign tumors   ESOPHAGEAL MANOMETRY N/A 11/04/2015   Procedure: ESOPHAGEAL MANOMETRY (EM);  Surgeon: Carman Ching, MD;  Location: [facility];  Service: Endoscopy;  Laterality: N/A;   ESOPHAGOGASTRODUODENOSCOPY  05/02/2011   Procedure: ESOPHAGOGASTRODUODENOSCOPY (EGD);  Surgeon: Vertell Novak., MD;  Location: [facility];  Service: Endoscopy;  Laterality: N/A;   ESOPHAGOGASTRODUODENOSCOPY N/A 09/01/2014   Procedure: ESOPHAGOGASTRODUODENOSCOPY (EGD);  Surgeon: Carman Ching, MD;  Location: [facility];  Service: Endoscopy;  Laterality: N/A;   KNEE SURGERY     LAPAROSCOPY     x 4   LESION REMOVAL N/A 01/18/2022   Procedure: EXCISION OF SEBACEOUS CYSTS CHEST AND NECK;  Surgeon: Harriette Bouillon, MD;  Location: [facility];  Service: General;  Laterality: N/A;   PH IMPEDANCE STUDY N/A 11/04/2015   Procedure: PH IMPEDANCE STUDY;  Surgeon: Carman Ching, MD;  Location: [facility];  Service: Endoscopy;  Laterality: N/A;   VAGINAL HYSTERECTOMY  2010   TVH--ovaries remain   Patient  Active Problem List   Diagnosis Date Noted   Sjogren syndrome with keratoconjunctivitis (HCC) 12/09/2022   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   Elevated LFTs 04/04/2021   Colitis 04/04/2021   Bacterial overgrowth syndrome 05/21/2020   Obesity 05/21/2020   History of endometriosis 05/21/2020   Constipation due to outlet dysfunction 02/05/2020   Gastroesophageal reflux disease 02/05/2020   Obstructive sleep apnea treated with continuous positive airway pressure (CPAP) 06/16/2017   Perennial allergic rhinitis with a nonallergic component 01/31/2017   Asthmatic bronchitis 01/31/2017   GI symptoms 01/31/2017   Family history of  colon cancer 01/27/2015   Chest pain 12/13/2012   Dyspnea 12/13/2012   DVT of lower extremity (deep venous thrombosis) (HCC) 01/03/1990    PCP: Coral Else, MD  REFERRING PROVIDER: Lorenda Ishihara, MD  REFERRING DIAG: I89.0  THERAPY DIAG:  Lymphedema, not elsewhere classified  Rationale for Evaluation and Treatment: Rehabilitation  ONSET DATE: ~2014; exacerbation in 2023  SUBJECTIVE:                                                                                                                                                                                           SUBJECTIVE STATEMENT: Karen [name] returns to OT for Rx visit for BLE/BLQ lymphedema care. Pt denies lymphedema related leg pain. She reports increased feeling of heaviness in her gut. Pt reports recent GI imaging does confirm worsening constipation.  Pt states she feels lymphatic massage has helped some with got motility.  PERTINENT HISTORY: CVI, OSA (no CPAP)  PAIN:  Are you having pain? Yes: NPRS scale: not rated/10 Pain location: BLE Pain description: tight, heavy, tingling, numbness Aggravating factors: standing, walking, dependent sitting > 30 minutes Relieving factors: elevation  PRECAUTIONS: Other: LYMPHEDEMA; Hx LLE DVT  RED FLAGS: Hx LLE DVT  WEIGHT BEARING RESTRICTIONS: No  FALLS:  Has patient fallen in last 6 months? No  LIVING ENVIRONMENT: Lives with: lives with spouse and daughter Lives in: House/apartment Stairs: Yes; Internal: 14 steps; yes and External: 4 steps; yes Has following equipment at home: None  OCCUPATION: [employer] operational officer, walking, desk work  LEISURE: family time  HAND DOMINANCE: right   PRIOR LEVEL OF FUNCTION: Independent  PATIENT GOALS: Feel better, be able to be more active without pain, wear preferred street shoes  OBJECTIVE: Note: Objective measures were completed at Evaluation unless otherwise noted.  COGNITION:  Overall cognitive status: Within  functional limits for tasks assessed   OBSERVATIONS / OTHER ASSESSMENTS:   POSTURE: WFL  LE ROM: WFL  LE MMT: WFL  LYMPHEDEMA ASSESSMENTS: non-Ca related  INFECTIONS: denies cellulitis and wound hx  BLE COMPARATIVE LIMB VOLUMETRICS Initial 11/15/22  LANDMARK RIGHT (dominant)  R LEG (A-D) 3028.9 ml  R THIGH (E-G) 4809.60 ml  R FULL LIMB (A-G) 7838.5 ml  Limb Volume differential (LVD)  %  Volume change since initial %  Volume change overall V  (Blank rows = not tested)  LANDMARK LEFT  (Rx)  L LEG (A-D) 3189.9 ml  L THIGH (E-G) 4959.8 ml  L FULL LIMB (A-G) 8149.7 ml  Limb Volume differential (LVD)  LEG LVD = 5.32%, L>R; THIGH LVD = 3.12%, L>R; And full LLE LVD = 3.97%, L>R. 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 Obesity  Skin  Description Hyper-Keratosis Peau d' Orange Shiny Tight Fibrotic/ Indurated Fatty Doughy Spongy/ boggy       R>L x  x   Skin dry Flaky WNL Macerated   mildly      Color Redness Varicosities Blanching Hemosiderin Stain Mottled        x   Odor Malodorous Yeast Fungal infection  WNL      x   Temperature Warm Cool wnl     x    Pitting Edema   1+ 2+ 3+ 4+ Non-pitting         x   Girth Symmetrical Asymmetrical                   Distribution    L>R toes to groin    Stemmer Sign Positive Negative   +L -R   Lymphorrhea History Of:  Present Absent     x    Wounds History Of Present Absent Venous Arterial Pressure Sheer     x        Signs of Infection Redness Warmth Erythema Acute Swelling Drainage Borders                    Sensation Light Touch Deep pressure Hypersensitivity   In tact Impaired In tact Impaired Absent Impaired   x  x  x     Nails WNL   Fungus nail dystrophy   x     Hair Growth Symmetrical Asymmetrical   x    Skin Creases Base of toes  Ankles   Base of Fingers knees       Abdominal  pannus Thigh Lobules  Face/neck   x           GAIT: Distance walked: >500' Assistive device utilized: None Level of assistance: Complete Independence Comments: Functional ambulation and transfers [facility]  LYMPHEDEMA LIFE IMPACT SCALE (LLIS): Initial 11/15/22  50%  FOTO functional outcome measure: TBA initial Rx visit 2/2 time constraints  TODAY'S TREATMENT:  MLD to LLE/LLQ Pt edu re lymphatic structure and function, simple self MLD   PATIENT EDUCATION:  Continued Pt/ CG edu for lymphedema self care home program throughout session. Topics include outcome of comparative limb volumetrics- starting limb volume differentials (LVDs), technology and gradient techniques used for short stretch, multilayer compression wrapping, simple self-MLD, therapeutic lymphatic pumping exercises, skin/nail care, LE precautions,. compression garment recommendations and specifications, wear and care schedule and compression garment donning / doffing w assistive devices. Discussed progress towards all OT goals since commencing CDT. All questions answered to the Pt's satisfaction. Good return. Person educated: Patient  Education method: Explanation, Demonstration, and Handouts Education comprehension: verbalized understanding, returned demonstration, verbal cues required, and needs further education  HOME EXERCISE PROGRAM: BLE lymphatic pumping there ex using- 1 sets of 10 reps, each exercise in order-  1-2 x daily, bilaterally Simple self MLD 1 x daily Daily skin care to increase hydration, skin mobility and decrease infection risk- can be done during MLD Compression wraps 23/7 during intensive phase of CDT Compression garments/ devices during self-management phase of CDT  ASSESSMENT:  CLINICAL IMPRESSION:  Continued MLD to LLE/LLQ utilizing functional inguinal pathway, deep  lymphatics, and short neck sequence.Pt tolerated stimulation to deep abdominal lymphatics and ascending, transverse and descending colon without increased pain. Cont as per POC.   11/15/22 Initial Evaluation: Kanyah Dory is a 52 y.o. female presenting with very mild, stage II, LLE lymphedema 2/2 venous insufficiency and obesity. L Leg swelling and associated pain swelling fluctuates. It has progressed over time, and now no longer resolves with elevation over night. RLE swelling limits balance during functional ambulation. It exacerbates infection and falls risk. LLE/RLQ lymphedema interferes with functional performance in all occupational domains, including basic and instrumental ADLs, productive activities, leisure pursuits and social participation. Pt will benefit from Occupational Therapy for Complete Decongestive Therapy (CDT) to restore function, to reduce physical and psychologic suffering associated with chronic, progressive lymphedema and associated pain, and to limit infection. CDT will include manual lymphatic drainage (MLD), skin care, therapeutic exercise and compression wraps and garment's devices. Without skilled OT for lymphedema care the condition will worsen and further functional decline is expected  Custom-made gradient compression garments and HOS devices are medically necessary because they are uniquely sized and shaped to fit the exact dimensions of the affected extremities, and to provide appropriate medical grade, graduated compression essential for optimally managing chronic, progressive lymphedema. Multiple custom compression garments are needed to ensure proper hygiene to limit infection risk. Custom compression garments should be replaced q 3-6 months When worn consistently for optimal lipo-lymphedema self-management over time. HOS devices, medically necessary to limit fibrosis buildup in tissue, should be replaced q 2 years and PRN when worn out.      OBJECTIVE IMPAIRMENTS:  decreased activity tolerance, decreased knowledge of condition, decreased knowledge of use of DME, increased edema, impaired sensation, pain, and chronic, progressive leg swelling .   ACTIVITY LIMITATIONS: dependent sitting, extended standing and/ or walking, squatting, and lower body dressing, fitting preferred street shoes  PARTICIPATION LIMITATIONS: shopping, community activity, occupation, and yard work  PERSONAL FACTORS: 3+ comorbidities: Hx DVT,  OSA, CVI. Varicose veins  are also affecting patient's functional outcome.   REHAB POTENTIAL: Good  EVALUATION COMPLEXITY: Moderate   GOALS: Goals reviewed with patient? Yes  SHORT TERM GOALS: Target date: 4th OT Rx visit  SHORT TERM GOALS: Target date: 4th OT Rx visit   Pt will demonstrate understanding of lymphedema precautions and prevention strategies with modified independence  using a printed reference to identify at least 5 precautions and discussing how s/he may implement them into daily life to reduce risk of progression with extra time. Baseline:Max A Goal status: INITIAL   Pt will be able to apply multilayer, knee length, gradient, compression wraps to one leg at a time with modified assistance (extra time and assistive device/s) to decrease limb volume, to limit infection risk, and to limit lymphedema progression.  Baseline: Dependent Goal status: INITIAL  LONG TERM GOALS: Target date: 02/08/23  Given this patient's Intake score of TBA /100% on the functional outcomes FOTO tool, patient will experience an increase in function of 5 points to improve basic and instrumental ADLs performance, including lymphedema self-care.  Baseline: Max A Goal status: INITIAL   Given this patient's Intake score of TBA% on the Lymphedema Life Impact Scale (LLIS), patient will experience a reduction of at least 5 points in her perceived level of functional impairment resulting from lymphedema to improve functional performance and quality of life  (QOL). Baseline: 64.71% Goal status: INITIAL  3 Pt will achieve at least a 10% volume reduction in B legs to return limb to typical size and shape, to limit infection risk and LE progression, to decrease pain, to improve function. Baseline: Dependent Goal status: INITIAL  4.  Pt will obtain proper compression garments/devices and achieve modified independence (extra time + assistive devices) with donning/doffing to optimize limb volume reductions and limit LE  progression over time. Baseline:  Goal status: INITIAL  5.  During Intensive phase CDT , with modified independence, Pt will achieve at least 85% compliance with all lymphedema self-care home program components, including daily skin care, compression wraps and /or garments, simple self MLD and lymphatic pumping therex to habituate LE self care protocol  into ADLs for optimal LE self-management over time. Baseline: Dependent Goal status: INITIAL   PLAN:  PT FREQUENCY: 2x/week  PT DURATION: 12 weeks  PLANNED INTERVENTIONS: 97110-Therapeutic exercises, 97530- Therapeutic activity, 97535- Self Care, 81191- Manual therapy, Patient/Family education, Manual lymph drainage, Compression bandaging, and fit with appropriate compression garments  PLAN FOR NEXT SESSION:  BLE comparative limb volumetrics Pt edu for LE self care  Loel Dubonnet, MS, OTR/L, CLT-LANA 12/13/22 12:48 PM '

## 2022-12-14 ENCOUNTER — Ambulatory Visit: Payer: Commercial Managed Care - PPO | Admitting: Occupational Therapy

## 2022-12-14 LAB — IGG, IGA, IGM
IgA/Immunoglobulin A, Serum: 176 mg/dL (ref 87–352)
IgG (Immunoglobin G), Serum: 1438 mg/dL (ref 586–1602)
IgM (Immunoglobulin M), Srm: 167 mg/dL (ref 26–217)

## 2022-12-14 LAB — LYMPH ENUMERATION, BASIC & NK CELLS
% CD 3 Pos. Lymph.: 78.5 % (ref 57.5–86.2)
% CD 4 Pos. Lymph.: 57.1 % (ref 30.8–58.5)
% NK (CD56/16): 10.7 % (ref 1.4–19.4)
Ab NK (CD56/16): 193 /uL (ref 24–406)
Absolute CD 3: 1413 /uL (ref 622–2402)
Absolute CD 4 Helper: 1028 /uL (ref 359–1519)
Basophils Absolute: 0 10*3/uL (ref 0.0–0.2)
Basos: 1 %
CD19 % B Cell: 9.6 % (ref 3.3–25.4)
CD19 Abs: 173 /uL (ref 12–645)
CD4/CD8 Ratio: 2.83 (ref 0.92–3.72)
CD8 % Suppressor T Cell: 20.2 % (ref 12.0–35.5)
CD8 T Cell Abs: 364 /uL (ref 109–897)
EOS (ABSOLUTE): 0.5 10*3/uL — ABNORMAL HIGH (ref 0.0–0.4)
Eos: 9 %
Hematocrit: 38.3 % (ref 34.0–46.6)
Hemoglobin: 12.8 g/dL (ref 11.1–15.9)
Immature Grans (Abs): 0 10*3/uL (ref 0.0–0.1)
Immature Granulocytes: 0 %
Lymphocytes Absolute: 1.8 10*3/uL (ref 0.7–3.1)
Lymphs: 34 %
MCH: 32.3 pg (ref 26.6–33.0)
MCHC: 33.4 g/dL (ref 31.5–35.7)
MCV: 97 fL (ref 79–97)
Monocytes Absolute: 0.4 10*3/uL (ref 0.1–0.9)
Monocytes: 8 %
Neutrophils Absolute: 2.5 10*3/uL (ref 1.4–7.0)
Neutrophils: 48 %
Platelets: 243 10*3/uL (ref 150–450)
RBC: 3.96 x10E6/uL (ref 3.77–5.28)
RDW: 11.4 % — ABNORMAL LOW (ref 11.7–15.4)
WBC: 5.2 10*3/uL (ref 3.4–10.8)

## 2022-12-14 LAB — STREP PNEUMONIAE 23 SEROTYPES IGG
Pneumo Ab Type 14*: 14.7 ug/mL (ref >1.3)
Pneumo Ab Type 17 (17F)*: 0.7 ug/mL — ABNORMAL LOW (ref >1.3)
Pneumo Ab Type 19 (19F)*: 1.8 ug/mL (ref >1.3)
Pneumo Ab Type 2*: 2.8 ug/mL (ref >1.3)
Pneumo Ab Type 20*: 1.8 ug/mL (ref >1.3)
Pneumo Ab Type 22 (22F)*: 0.2 ug/mL — ABNORMAL LOW (ref >1.3)
Pneumo Ab Type 23 (23F)*: 0.6 ug/mL — ABNORMAL LOW (ref >1.3)
Pneumo Ab Type 26 (6B)*: 0.6 ug/mL — ABNORMAL LOW (ref >1.3)
Pneumo Ab Type 34 (10A)*: 1.5 ug/mL (ref >1.3)
Pneumo Ab Type 4*: 0.2 ug/mL — ABNORMAL LOW (ref >1.3)
Pneumo Ab Type 43 (11A)*: 0.4 ug/mL — ABNORMAL LOW (ref >1.3)
Pneumo Ab Type 5*: 0.5 ug/mL — ABNORMAL LOW (ref >1.3)
Pneumo Ab Type 51 (7F)*: 0.1 ug/mL — ABNORMAL LOW (ref >1.3)
Pneumo Ab Type 54 (15B)*: 0.3 ug/mL — ABNORMAL LOW (ref >1.3)
Pneumo Ab Type 56 (18C)*: 1.7 ug/mL (ref >1.3)
Pneumo Ab Type 57 (19A)*: 0.7 ug/mL — ABNORMAL LOW (ref >1.3)
Pneumo Ab Type 68 (9V)*: 0.2 ug/mL — ABNORMAL LOW (ref >1.3)
Pneumo Ab Type 70 (33F)*: 1 ug/mL — ABNORMAL LOW (ref >1.3)
Pneumo Ab Type 9 (9N)*: 2.6 ug/mL (ref >1.3)

## 2022-12-14 LAB — T-HELPER CELLS CD4/CD8 %
% CD 4 Pos. Lymph.: 58.3 % (ref 30.8–58.5)
Absolute CD 4 Helper: 1049 /uL (ref 359–1519)
CD3+CD4+ Cells/CD3+CD8+ Cells Bld: 2.86 (ref 0.92–3.72)
CD3+CD8+ Cells # Bld: 367 /uL (ref 109–897)
CD3+CD8+ Cells NFr Bld: 20.4 % (ref 12.0–35.5)

## 2022-12-14 LAB — DIPHTHERIA / TETANUS ANTIBODY PANEL
Diphtheria Ab: 2.8 [IU]/mL (ref <0.10)
Tetanus Ab, IgG: 4.47 [IU]/mL (ref <0.10)

## 2022-12-14 LAB — COMPLEMENT, TOTAL

## 2022-12-15 ENCOUNTER — Encounter: Payer: Self-pay | Admitting: Allergy & Immunology

## 2022-12-15 ENCOUNTER — Ambulatory Visit: Payer: Commercial Managed Care - PPO | Attending: Obstetrics & Gynecology

## 2022-12-15 ENCOUNTER — Ambulatory Visit: Payer: Commercial Managed Care - PPO | Admitting: Occupational Therapy

## 2022-12-15 DIAGNOSIS — M62838 Other muscle spasm: Secondary | ICD-10-CM | POA: Insufficient documentation

## 2022-12-15 DIAGNOSIS — M6281 Muscle weakness (generalized): Secondary | ICD-10-CM | POA: Insufficient documentation

## 2022-12-15 DIAGNOSIS — R102 Pelvic and perineal pain: Secondary | ICD-10-CM | POA: Diagnosis not present

## 2022-12-15 DIAGNOSIS — M5459 Other low back pain: Secondary | ICD-10-CM | POA: Insufficient documentation

## 2022-12-15 DIAGNOSIS — I890 Lymphedema, not elsewhere classified: Secondary | ICD-10-CM | POA: Diagnosis not present

## 2022-12-15 DIAGNOSIS — R279 Unspecified lack of coordination: Secondary | ICD-10-CM | POA: Insufficient documentation

## 2022-12-15 NOTE — Therapy (Signed)
OUTPATIENT PHYSICAL THERAPY FEMALE PELVIC TREATMENT   Patient Name: Karen [name] MRN: [id number] DOB:1970/09/26, 52 y.o., female Today's Date: 12/15/2022  END OF SESSION:  PT End of Session - 12/15/22 1023     Visit Number 23    Date for PT Re-Evaluation 04/26/23    Authorization Type [employer]    PT Start Time 1022    PT Stop Time 1058    PT Time Calculation (min) 36 min    Activity Tolerance Patient tolerated treatment well    Behavior During Therapy [facility] for tasks assessed/performed                     Past Medical History:  Diagnosis Date   Adenomyosis    Anemia    Arthritis 2013   L knee gets steroid injections    Asthmatic bronchitis 01/31/2017   Back pain    Chronic constipation    Diarrhea    DVT of lower extremity (deep venous thrombosis) (HCC)    Dysmenorrhea    Endometriosis    Esophageal stricture    GERD (gastroesophageal reflux disease)    History of kidney stones    History of uterine fibroid    IBS (irritable bowel syndrome)    Interstitial cystitis    Lactose intolerance    Migraine    with aura   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   PONV (postoperative nausea and vomiting)    Recurrent upper respiratory infection (URI)    Sebaceous cyst of breast, right lower inner quadrant 10/25/2012   Excised 11/21/12    Sleep apnea    Syncope, non cardiac    Past Surgical History:  Procedure Laterality Date   ARTHROSCOPIC HAGLUNDS REPAIR     BREAST CYST EXCISION Right 11/21/2012   Procedure: CYST EXCISION BREAST;  Surgeon: Currie Paris, MD;  Location: [facility];  Service: General;  Laterality: Right;   BREAST CYST EXCISION Bilateral 01/18/2022   Procedure: EXCISION OF SEBACEOUS CYST BILATERAL BREAST;  Surgeon: Harriette Bouillon, MD;  Location: [facility];  Service: General;  Laterality: Bilateral;   BREAST EXCISIONAL BIOPSY Right 11/2012   CESAREAN SECTION  04/19/1993   CHOLECYSTECTOMY      COLONOSCOPY  05/02/2011   Procedure: COLONOSCOPY;  Surgeon: Vertell Novak., MD;  Location: [facility];  Service: Endoscopy;  Laterality: N/A;   colonoscopy  11/04/2014   DENTAL SURGERY  03/06/2018   2 surgeries ( 03/06/2018 and 04/06/2018)   to remove two separate benign tumors   ESOPHAGEAL MANOMETRY N/A 11/04/2015   Procedure: ESOPHAGEAL MANOMETRY (EM);  Surgeon: Carman Ching, MD;  Location: [facility];  Service: Endoscopy;  Laterality: N/A;   ESOPHAGOGASTRODUODENOSCOPY  05/02/2011   Procedure: ESOPHAGOGASTRODUODENOSCOPY (EGD);  Surgeon: Vertell Novak., MD;  Location: [facility];  Service: Endoscopy;  Laterality: N/A;   ESOPHAGOGASTRODUODENOSCOPY N/A 09/01/2014   Procedure: ESOPHAGOGASTRODUODENOSCOPY (EGD);  Surgeon: Carman Ching, MD;  Location: [facility];  Service: Endoscopy;  Laterality: N/A;   KNEE SURGERY     LAPAROSCOPY     x 4   LESION REMOVAL N/A 01/18/2022   Procedure: EXCISION OF SEBACEOUS CYSTS CHEST AND NECK;  Surgeon: Harriette Bouillon, MD;  Location: [facility];  Service: General;  Laterality: N/A;   PH IMPEDANCE STUDY N/A 11/04/2015   Procedure: PH IMPEDANCE STUDY;  Surgeon: Carman Ching, MD;  Location: [facility];  Service: Endoscopy;  Laterality: N/A;   VAGINAL HYSTERECTOMY  2010   TVH--ovaries remain  Patient Active Problem List   Diagnosis Date Noted   Sjogren syndrome with keratoconjunctivitis (HCC) 12/09/2022   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   Elevated LFTs 04/04/2021   Colitis 04/04/2021   Bacterial overgrowth syndrome 05/21/2020   Obesity 05/21/2020   History of endometriosis 05/21/2020   Constipation due to outlet dysfunction 02/05/2020   Gastroesophageal reflux disease 02/05/2020   Obstructive sleep apnea treated with continuous positive airway pressure (CPAP) 06/16/2017   Perennial allergic rhinitis with a nonallergic component 01/31/2017   Asthmatic bronchitis 01/31/2017   GI symptoms 01/31/2017   Family history of  colon cancer 01/27/2015   Chest pain 12/13/2012   Dyspnea 12/13/2012   DVT of lower extremity (deep venous thrombosis) (HCC) 01/03/1990    PCP: Lorenda Ishihara, MD  REFERRING PROVIDER: Jerene Bears, MD   REFERRING DIAG: M62.89 (ICD-10-CM) - Pelvic floor dysfunction  THERAPY DIAG:  Pelvic pain  Other low back pain  Muscle weakness (generalized)  Other muscle spasm  Unspecified lack of coordination  Rationale for Evaluation and Treatment: Rehabilitation  ONSET DATE: chronic  SUBJECTIVE:                                                                                                                                                                                           SUBJECTIVE STATEMENT: Pt states that she has been having more productive bowel movements, but she is constipated with large stool burden again. She is going to perform cleanse this weekend. She feels like bladder retraining is going well.   PAIN:  Are you having pain? Yes NPRS scale: 3/10 Pain location:  Lower abdominal pain, Lt sided pain, low back pain  Pain type: turning, knotted, pressure Pain description: intermittent and constant   Aggravating factors: constipation or frequent bowel movements/constantly having bowel movements  Relieving factors: exercises (bowel massage, happy baby, spinal twists, walking)  PRECAUTIONS: None  WEIGHT BEARING RESTRICTIONS: No  FALLS:  Has patient fallen in last 6 months? No  LIVING ENVIRONMENT: Lives with: lives with their spouse Lives in: House/apartment  OCCUPATION: nurse, in admin  PLOF: Independent  PATIENT GOALS: decrease pain and have more normal bowel movements  PERTINENT HISTORY:  Vaginal hysterectomy, DVT LE, endometriosis, interstitial cystitis, exploratory laps for endometriosis, c-section, cholecystectomy Sexual abuse: No  BOWEL MOVEMENT: Pain with bowel movement: Yes Type of bowel movement:Type (Bristol Stool Scale) 1-7 (sometimes  full range in the same bowel movement), Frequency sometimes many times a day, sometimes up to 4 days in between, and Strain Yes Fully empty rectum: No Leakage: No Pads: No Fiber supplement: No - has attempted metamucil and it made constipation worse  URINATION: Pain  with urination: Yes Fully empty bladder: No Stream:  varies  Urgency: Yes: all the time Frequency: multiple times an hours  Leakage: Urge to void, Walking to the bathroom, Coughing, Sneezing, Laughing, and Exercise Pads: Yes: daily  INTERCOURSE: Pain with intercourse: Initial Penetration and During Penetration Ability to have vaginal penetration:  Yes: with pain Climax: non painful WNL   PREGNANCY: Vaginal deliveries 1 Tearing Yes: 3rd degree tear C-section deliveries 1 Currently pregnant No  PROLAPSE: Periodically will feel vaginal bulging, heaviness in lower abdomen   OBJECTIVE:  11/09/22: No internal rectal or vaginal pelvic floor exam performed due to high level of skin irritation   Weak transversus abdominus contraction  Abdominal restriction and tenderness in bil lower quadrant  Unable to perform pelvic tilt with appropriate coordination  LUMBARAROM/PROM:  A/PROM A/PROM  Eval (% available) 11/09/22 (% available)  Flexion 50 90  Extension 25, pain anteriorly  25, pain in low back  Right lateral flexion 50, pain anteriorly  75, pain on right  Left lateral flexion 50, pain anteriorly  75, pain on right   (Blank rows = not tested) 07/21/22: standing prolapse assessment demonstrated grade 2 anterior vaginal wall laxity   06/08/22:               External Perineal Exam: WNL                              Internal Pelvic Floor: burning reported with palpation of superficial muscles; deep aching/pressure with palpation of Lt levator ani   Patient confirms identification and approves PT to assess internal pelvic floor and treatment Yes  PELVIC MMT:   MMT eval  Vaginal 3/5  Internal Anal Sphincter 1/5   External Anal Sphincter 2/5  Puborectalis 1/5  Diastasis Recti   (Blank rows = not tested)        TONE: High in Lt levator ani   PROLAPSE: Not able to tell this session due to dyssynergic pelvic floor contraction     06/01/22: COGNITION: Overall cognitive status: Within functional limits for tasks assessed     SENSATION: Light touch: Appears intact Proprioception: Appears intact  GAIT: Comments: decreased hip extension, forward flexed trunk  POSTURE: rounded shoulders, forward head, decreased lumbar lordosis, increased thoracic kyphosis, posterior pelvic tilt, and flexed trunk    LUMBARAROM/PROM:  A/PROM A/PROM  Eval (% available)  Flexion 50  Extension 25, pain anteriorly   Right lateral flexion 50, pain anteriorly   Left lateral flexion 50, pain anteriorly    (Blank rows = not tested)   PALPATION:   General  Significant abdominal restriction and tenderness; decreased rib mobility with mobilization and breathing    TODAY'S TREATMENT:                                                                                                                              DATE:  12/15/22 Manual: Trigger  Point Dry-Needling  Treatment instructions: Expect mild to moderate muscle soreness. S/S of pneumothorax if dry needled over a lung field, and to seek immediate medical attention should they occur. Patient verbalized understanding of these instructions and education.  Patient Consent Given: Yes Education handout provided: Previously provided Muscles treated: L1-L5 lumbar multifidi Electrical stimulation performed: Yes Parameters:  30Hz  and intensity to tolerance  Treatment response/outcome: good improvement in tissue restriction Soft tissue mobilization to lumbar paraspinals   11/23/22 Exercises: Supine piriformis stretch 2 min bil Single knee to chest 10x bil Wide leg lower trunk rotation 3 x 10 Supine pelvic tilts 12x Seated thoracic extensions on foam  roller Therapeutic activities: Urge drill Voiding schedule/bladder training Double voiding   11/14/22 Neuromuscular re-education: Seated pelvic tilts 2 x 10 Seated hip adduction ball squeeze with pelvic floor muscle and transversus abdominus 2 x 10 Seated hip abduction with yellow band, pelvic floor muscle and transversus abdominus 2 x 10 Seated resisted march 2 x 10 yellow band with pelvic floor muscle and transversus abdominus 2 x 10 Seated horizontal abduction/extension 2 x 10 green band  Exercises: Seated forward fold 10 breaths  Seated piriformis stretch 2 min bil Kneeling hip flexor stretch 2 minutes bil  Seated wide leg trunk rotation/adductor stretch 2 x 10   PATIENT EDUCATION:  Education details: See above Person educated: Patient Education method: Programmer, multimedia, Demonstration, Actor cues, Verbal cues, and Handouts Education comprehension: verbalized understanding  HOME EXERCISE PROGRAM: [email or web address]  ASSESSMENT:  CLINICAL IMPRESSION: Pt having had improved productivity with bowel movements, but still has developed another stool burden. She has continued working on [employer] and also feels like lymphatic drainage is helping. Today we performed dry needling with e-stim to lumbar paraspinals for neuro-modulation that hopefully will help with improved gut motility. She tolerated very well and did demonstrate decreased restriction in soft tissue surrounding lumbar spine. She will continue to benefit from skilled PT intervention in order to decrease pain, improve bowel movements, and decrease urinary urgency/incontinence.     OBJECTIVE IMPAIRMENTS: decreased activity tolerance, decreased coordination, decreased endurance, decreased mobility, decreased strength, increased fascial restrictions, increased muscle spasms, impaired tone, postural dysfunction, and pain.   ACTIVITY LIMITATIONS: lifting, bending, continence, and locomotion level  PARTICIPATION LIMITATIONS: interpersonal  relationship, community activity, and occupation  PERSONAL FACTORS: 1 comorbidity: medical history  are also affecting patient's functional outcome.   REHAB POTENTIAL: Good  CLINICAL DECISION MAKING: Stable/uncomplicated  EVALUATION COMPLEXITY: Low   GOALS: Goals reviewed with patient? Yes  SHORT TERM GOALS: Target date: 07/06/22 - updated 07/12/22 - updated 08/18/22 - updated 09/22/22 - updated 10/26/22 - updated 11/09/22 - updated 12/15/22  Pt will be independent with HEP.   Baseline: Goal status: MET 07/12/22  2.  Pt will be independent with diaphragmatic breathing and down training activities in order to improve pelvic floor relaxation.  Baseline:  Goal status: MET 07/12/22  3.  Pt will be independent with the knack, urge suppression technique, and double voiding in order to improve bladder habits and decrease urinary incontinence.   Baseline:  Goal status: MET 09/22/22  4.  Pt will be independent with use of squatty potty, relaxed toileting mechanics, and improved bowel movement techniques in order to increase ease of bowel movements and complete evacuation.   Baseline:  Goal status: MET 11/09/22  5.  Pt will be able to correctly perform diaphragmatic breathing and appropriate pressure management in order to prevent worsening vaginal wall laxity and improve pelvic floor A/ROM.   Baseline:  Goal  status: IN PROGRESS 12/15/22  LONG TERM GOALS: Target date: 11/16/2022 - updated 07/12/22 - updated 08/18/22 - updated 09/22/22 - updated 10/26/22 - updated 11/09/22 - updated 12/15/22  Pt will be independent with advanced HEP.   Baseline:  Goal status: IN PROGRESS 12/15/22  2.  Pt will demonstrate normal pelvic floor muscle tone and A/ROM, able to achieve 4/5 strength with contractions and 10 sec endurance, in order to provide appropriate lumbopelvic support in functional activities.   Baseline:  Goal status: IN PROGRESS 12/15/22  3.  Pt will increase all impaired lumbar A/ROM by 25%  without pain.  Baseline:  Goal status:  IN PROGRESS 12/15/22  4.  Pt will report pain no higher than 3/10 with any activity. Baseline:  Goal status:  IN PROGRESS 12/15/22  5.  Pt will report 0/10 pain with vaginal penetration in order to improve intimate relationship with partner.    Baseline:  Goal status:  IN PROGRESS 12/15/22  6.  Pt will be able to go 2-3 hours in between voids without urgency or incontinence in order to improve QOL and perform all functional activities with less difficulty.   Baseline: urgency improving Goal status: IN PROGRESS 12/15/22  7.  Pt will report no leaks with laughing, coughing, sneezing in order to improve comfort with interpersonal relationships and community activities.   Baseline:  Goal status:  IN PROGRESS 12/15/22  8.  Pt will have consistent bowel movements 4-5x/week without straining or pain.  Baseline:  Goal status:  IN PROGRESS 12/15/22  PLAN:  PT FREQUENCY: 1-2x/week  PT DURATION: 6 months  PLANNED INTERVENTIONS: Therapeutic exercises, Therapeutic activity, Neuromuscular re-education, Balance training, Gait training, Patient/Family education, Self Care, Joint mobilization, Dry Needling, Biofeedback, and Manual therapy  PLAN FOR NEXT SESSION: Continue to work on motor control activities surrounding pelvis; progress mobility; manual techniques as needed for comfort and to increase circulation.   Julio Alm, PT, DPT12/12/2409:15 AM

## 2022-12-15 NOTE — Therapy (Signed)
OUTPATIENT OCCUPATIONAL THERAPY TREATMENT NOTE  LOWER EXTREMITY LYMPHEDEMA  Patient Name: Karen [name] MRN: [id number] DOB:07/21/1970, 52 y.o., female Today's Date: 12/15/2022  END OF SESSION:   OT End of Session - 12/15/22 1303     Visit Number 9    Number of Visits 36    Date for OT Re-Evaluation 02/08/23    OT Start Time 0103    OT Stop Time 0203    OT Time Calculation (min) 60 min    Activity Tolerance Patient tolerated treatment well;No increased pain    Behavior During Therapy [facility] for tasks assessed/performed               Past Medical History:  Diagnosis Date   Adenomyosis    Anemia    Arthritis 2013   L knee gets steroid injections    Asthmatic bronchitis 01/31/2017   Back pain    Chronic constipation    Diarrhea    DVT of lower extremity (deep venous thrombosis) (HCC)    Dysmenorrhea    Endometriosis    Esophageal stricture    GERD (gastroesophageal reflux disease)    History of kidney stones    History of uterine fibroid    IBS (irritable bowel syndrome)    Interstitial cystitis    Lactose intolerance    Migraine    with aura   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   PONV (postoperative nausea and vomiting)    Recurrent upper respiratory infection (URI)    Sebaceous cyst of breast, right lower inner quadrant 10/25/2012   Excised 11/21/12    Sleep apnea    Syncope, non cardiac    Past Surgical History:  Procedure Laterality Date   ARTHROSCOPIC HAGLUNDS REPAIR     BREAST CYST EXCISION Right 11/21/2012   Procedure: CYST EXCISION BREAST;  Surgeon: Currie Paris, MD;  Location: [facility];  Service: General;  Laterality: Right;   BREAST CYST EXCISION Bilateral 01/18/2022   Procedure: EXCISION OF SEBACEOUS CYST BILATERAL BREAST;  Surgeon: Harriette Bouillon, MD;  Location: [facility];  Service: General;  Laterality: Bilateral;   BREAST EXCISIONAL BIOPSY Right 11/2012   CESAREAN SECTION  04/19/1993   CHOLECYSTECTOMY      COLONOSCOPY  05/02/2011   Procedure: COLONOSCOPY;  Surgeon: Vertell Novak., MD;  Location: [facility];  Service: Endoscopy;  Laterality: N/A;   colonoscopy  11/04/2014   DENTAL SURGERY  03/06/2018   2 surgeries ( 03/06/2018 and 04/06/2018)   to remove two separate benign tumors   ESOPHAGEAL MANOMETRY N/A 11/04/2015   Procedure: ESOPHAGEAL MANOMETRY (EM);  Surgeon: Carman Ching, MD;  Location: [facility];  Service: Endoscopy;  Laterality: N/A;   ESOPHAGOGASTRODUODENOSCOPY  05/02/2011   Procedure: ESOPHAGOGASTRODUODENOSCOPY (EGD);  Surgeon: Vertell Novak., MD;  Location: [facility];  Service: Endoscopy;  Laterality: N/A;   ESOPHAGOGASTRODUODENOSCOPY N/A 09/01/2014   Procedure: ESOPHAGOGASTRODUODENOSCOPY (EGD);  Surgeon: Carman Ching, MD;  Location: [facility];  Service: Endoscopy;  Laterality: N/A;   KNEE SURGERY     LAPAROSCOPY     x 4   LESION REMOVAL N/A 01/18/2022   Procedure: EXCISION OF SEBACEOUS CYSTS CHEST AND NECK;  Surgeon: Harriette Bouillon, MD;  Location: [facility];  Service: General;  Laterality: N/A;   PH IMPEDANCE STUDY N/A 11/04/2015   Procedure: PH IMPEDANCE STUDY;  Surgeon: Carman Ching, MD;  Location: [facility];  Service: Endoscopy;  Laterality: N/A;   VAGINAL HYSTERECTOMY  2010   TVH--ovaries remain   Patient  Active Problem List   Diagnosis Date Noted   Sjogren syndrome with keratoconjunctivitis (HCC) 12/09/2022   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   Elevated LFTs 04/04/2021   Colitis 04/04/2021   Bacterial overgrowth syndrome 05/21/2020   Obesity 05/21/2020   History of endometriosis 05/21/2020   Constipation due to outlet dysfunction 02/05/2020   Gastroesophageal reflux disease 02/05/2020   Obstructive sleep apnea treated with continuous positive airway pressure (CPAP) 06/16/2017   Perennial allergic rhinitis with a nonallergic component 01/31/2017   Asthmatic bronchitis 01/31/2017   GI symptoms 01/31/2017   Family history of  colon cancer 01/27/2015   Chest pain 12/13/2012   Dyspnea 12/13/2012   DVT of lower extremity (deep venous thrombosis) (HCC) 01/03/1990    PCP: Coral Else, MD  REFERRING PROVIDER: Lorenda Ishihara, MD  REFERRING DIAG: I89.0  THERAPY DIAG:  Lymphedema, not elsewhere classified  Rationale for Evaluation and Treatment: Rehabilitation  ONSET DATE: ~2014; exacerbation in 2023  SUBJECTIVE:                                                                                                                                                                                           SUBJECTIVE STATEMENT: Karen [name] returns to OT for Rx visit for BLE/BLQ lymphedema care. Pt denies lymphedema related leg pain. She reports increased feeling of heaviness in  L lower quadrant. Pt agreeable to student observation during our session today. Pt requests MLD to bilateral legs today after having pelvic health appointment prior to OT today.   PERTINENT HISTORY: CVI, OSA (no CPAP)  PAIN:  Are you having pain? Yes: NPRS scale: not rated/10 Pain location: BLE Pain description: tight, heavy, tingling, numbness Aggravating factors: standing, walking, dependent sitting > 30 minutes Relieving factors: elevation  PRECAUTIONS: Other: LYMPHEDEMA; Hx LLE DVT  RED FLAGS: Hx LLE DVT  WEIGHT BEARING RESTRICTIONS: No  FALLS:  Has patient fallen in last 6 months? No  LIVING ENVIRONMENT: Lives with: lives with spouse and daughter Lives in: House/apartment Stairs: Yes; Internal: 14 steps; yes and External: 4 steps; yes Has following equipment at home: None  OCCUPATION: Dipl[employer] Services operational officer, walking, desk work  LEISURE: family time  HAND DOMINANCE: right   PRIOR LEVEL OF FUNCTION: Independent  PATIENT GOALS: Feel better, be able to be more active without pain, wear preferred street shoes  OBJECTIVE: Note: Objective measures were completed at Evaluation unless otherwise noted.  COGNITION:  Overall  cognitive status: Within functional limits for tasks assessed   OBSERVATIONS / OTHER ASSESSMENTS:   POSTURE: WFL  LE ROM: WFL  LE MMT: WFL  LYMPHEDEMA ASSESSMENTS: non-Ca related  INFECTIONS: denies cellulitis and wound hx  BLE COMPARATIVE LIMB VOLUMETRICS Initial 11/15/22  LANDMARK RIGHT (dominant)  R LEG (A-D) 3028.9 ml  R THIGH (E-G) 4809.60 ml  R FULL LIMB (A-G) 7838.5 ml  Limb Volume differential (LVD)  %  Volume change since initial %  Volume change overall V  (Blank rows = not tested)  LANDMARK LEFT  (Rx)  L LEG (A-D) 3189.9 ml  L THIGH (E-G) 4959.8 ml  L FULL LIMB (A-G) 8149.7 ml  Limb Volume differential (LVD)  LEG LVD = 5.32%, L>R; THIGH LVD = 3.12%, L>R; And full LLE LVD = 3.97%, L>R. 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 Obesity  Skin  Description Hyper-Keratosis Peau d' Orange Shiny Tight Fibrotic/ Indurated Fatty Doughy Spongy/ boggy       R>L x  x   Skin dry Flaky WNL Macerated   mildly      Color Redness Varicosities Blanching Hemosiderin Stain Mottled        x   Odor Malodorous Yeast Fungal infection  WNL      x   Temperature Warm Cool wnl     x    Pitting Edema   1+ 2+ 3+ 4+ Non-pitting         x   Girth Symmetrical Asymmetrical                   Distribution    L>R toes to groin    Stemmer Sign Positive Negative   +L -R   Lymphorrhea History Of:  Present Absent     x    Wounds History Of Present Absent Venous Arterial Pressure Sheer     x        Signs of Infection Redness Warmth Erythema Acute Swelling Drainage Borders                    Sensation Light Touch Deep pressure Hypersensitivity   In tact Impaired In tact Impaired Absent Impaired   x  x  x     Nails WNL   Fungus nail dystrophy   x     Hair Growth Symmetrical Asymmetrical   x    Skin Creases Base of toes  Ankles   Base of Fingers  knees       Abdominal pannus Thigh Lobules  Face/neck   x           GAIT: Distance walked: >500' Assistive device utilized: None Level of assistance: Complete Independence Comments: Functional ambulation and transfers [facility]  LYMPHEDEMA LIFE IMPACT SCALE (LLIS): Initial 11/15/22  50%  FOTO functional outcome measure: TBA initial Rx visit 2/2 time constraints  TODAY'S TREATMENT:  MLD to BLE/BLQ, emphasis on R Pt edu re lymphatic structure and function, abd LE self care home program  PATIENT EDUCATION:  Continued Pt/ CG edu for lymphedema self care home program throughout session. Topics include outcome of comparative limb volumetrics- starting limb volume differentials (LVDs), technology and gradient techniques used for short stretch, multilayer compression wrapping, simple self-MLD, therapeutic lymphatic pumping exercises, skin/nail care, LE precautions,. compression garment recommendations and specifications, wear and care schedule and compression garment donning / doffing w assistive devices. Discussed progress towards all OT goals since commencing CDT. All questions answered to the Pt's satisfaction. Good return. Person educated: Patient  Education method: Explanation, Demonstration, and Handouts Education comprehension: verbalized understanding, returned demonstration, verbal cues required, and needs further education  HOME EXERCISE PROGRAM: BLE lymphatic pumping there ex using- 1 sets of 10 reps, each exercise in order-  1-2 x daily, bilaterally Simple self MLD 1 x daily Daily skin care to increase hydration, skin mobility and decrease infection risk- can be done during MLD Compression wraps 23/7 during intensive phase of CDT Compression garments/ devices during self-management phase of CDT  ASSESSMENT:  CLINICAL IMPRESSION:  Pt tolerated BLE/ BLQ  MLD as established today. Leg swelling appears unchanged bilaterally after session. Pt reports some relief from leg pain at end of session. Compression garments are ordered. Awaiting fitting. Cont as per POC.  11/15/22 Initial Evaluation: Karen [name] is a 52 y.o. female presenting with very mild, stage II, LLE lymphedema 2/2 venous insufficiency and obesity. L Leg swelling and associated pain swelling fluctuates. It has progressed over time, and now no longer resolves with elevation over night. RLE swelling limits balance during functional ambulation. It exacerbates infection and falls risk. LLE/RLQ lymphedema interferes with functional performance in all occupational domains, including basic and instrumental ADLs, productive activities, leisure pursuits and social participation. Pt will benefit from Occupational Therapy for Complete Decongestive Therapy (CDT) to restore function, to reduce physical and psychologic suffering associated with chronic, progressive lymphedema and associated pain, and to limit infection. CDT will include manual lymphatic drainage (MLD), skin care, therapeutic exercise and compression wraps and garment's devices. Without skilled OT for lymphedema care the condition will worsen and further functional decline is expected  Custom-made gradient compression garments and HOS devices are medically necessary because they are uniquely sized and shaped to fit the exact dimensions of the affected extremities, and to provide appropriate medical grade, graduated compression essential for optimally managing chronic, progressive lymphedema. Multiple custom compression garments are needed to ensure proper hygiene to limit infection risk. Custom compression garments should be replaced q 3-6 months When worn consistently for optimal lipo-lymphedema self-management over time. HOS devices, medically necessary to limit fibrosis buildup in tissue, should be replaced q 2 years and PRN when worn out.       OBJECTIVE IMPAIRMENTS: decreased activity tolerance, decreased knowledge of condition, decreased knowledge of use of DME, increased edema, impaired sensation, pain, and chronic, progressive leg swelling .   ACTIVITY LIMITATIONS: dependent sitting, extended standing and/ or walking, squatting, and lower body dressing, fitting preferred street shoes  PARTICIPATION LIMITATIONS: shopping, community activity, occupation, and yard work  PERSONAL FACTORS: 3+ comorbidities: Hx DVT,  OSA, CVI. Varicose veins  are also affecting patient's functional outcome.   REHAB POTENTIAL: Good  EVALUATION COMPLEXITY: Moderate   GOALS: Goals reviewed with patient? Yes  SHORT TERM GOALS: Target date: 4th OT Rx visit  SHORT TERM GOALS: Target date: 4th OT Rx visit   Pt will demonstrate understanding of lymphedema  precautions and prevention strategies with modified independence using a printed reference to identify at least 5 precautions and discussing how s/he may implement them into daily life to reduce risk of progression with extra time. Baseline:Max A Goal status: INITIAL   Pt will be able to apply multilayer, knee length, gradient, compression wraps to one leg at a time with modified assistance (extra time and assistive device/s) to decrease limb volume, to limit infection risk, and to limit lymphedema progression.  Baseline: Dependent Goal status: INITIAL  LONG TERM GOALS: Target date: 02/08/23  Given this patient's Intake score of TBA /100% on the functional outcomes FOTO tool, patient will experience an increase in function of 5 points to improve basic and instrumental ADLs performance, including lymphedema self-care.  Baseline: Max A Goal status: INITIAL   Given this patient's Intake score of TBA% on the Lymphedema Life Impact Scale (LLIS), patient will experience a reduction of at least 5 points in her perceived level of functional impairment resulting from lymphedema to improve functional  performance and quality of life (QOL). Baseline: 64.71% Goal status: INITIAL  3 Pt will achieve at least a 10% volume reduction in B legs to return limb to typical size and shape, to limit infection risk and LE progression, to decrease pain, to improve function. Baseline: Dependent Goal status: INITIAL  4.  Pt will obtain proper compression garments/devices and achieve modified independence (extra time + assistive devices) with donning/doffing to optimize limb volume reductions and limit LE  progression over time. Baseline:  Goal status: INITIAL  5.  During Intensive phase CDT , with modified independence, Pt will achieve at least 85% compliance with all lymphedema self-care home program components, including daily skin care, compression wraps and /or garments, simple self MLD and lymphatic pumping therex to habituate LE self care protocol  into ADLs for optimal LE self-management over time. Baseline: Dependent Goal status: INITIAL   PLAN:  PT FREQUENCY: 2x/week  PT DURATION: 12 weeks  PLANNED INTERVENTIONS: 97110-Therapeutic exercises, 97530- Therapeutic activity, 97535- Self Care, [id number]- Manual therapy, Patient/Family education, Manual lymph drainage, Compression bandaging, and fit with appropriate compression garments  PLAN FOR NEXT SESSION:  BLE comparative limb volumetrics Pt edu for LE self care  Loel Dubonnet, MS, OTR/L, CLT-LANA 12/15/22 3:27 PM '

## 2022-12-16 ENCOUNTER — Encounter: Payer: Commercial Managed Care - PPO | Admitting: Occupational Therapy

## 2022-12-19 ENCOUNTER — Telehealth: Payer: Self-pay | Admitting: Allergy & Immunology

## 2022-12-19 ENCOUNTER — Telehealth: Payer: Self-pay | Admitting: *Deleted

## 2022-12-19 ENCOUNTER — Other Ambulatory Visit: Payer: Self-pay | Admitting: *Deleted

## 2022-12-19 DIAGNOSIS — B999 Unspecified infectious disease: Secondary | ICD-10-CM

## 2022-12-19 NOTE — Telephone Encounter (Signed)
Called pt to discuss sleep study results but her VM was full.

## 2022-12-19 NOTE — Telephone Encounter (Signed)
-----   Message from Huston Foley sent at 12/08/2022  5:39 PM EST ----- Patient had a home sleep to reevaluate her for sleep apnea.  She previously had mild sleep apnea but is no longer on AutoPap therapy.  She had a home sleep test on 12/03/2022.  Please advise patient that her home sleep test showed mild obstructive sleep apnea and we can consider AutoPap therapy again if she would like.  Alternative treatment options as she may recall, include a dental device through dentistry or weight loss with avoidance of the back sleep position.  I would be happy to prescribe an AutoPap machine for her to trial again if she would like. Let me know, how she would like to proceed.

## 2022-12-19 NOTE — Telephone Encounter (Signed)
Patient called and she stated she received a call about her labs and requested a call back at [phone].

## 2022-12-19 NOTE — Telephone Encounter (Signed)
Spoke with patient and reviewed results. She will call us back to get Pneumovax vaccine appointment scheduled with [facility] shot room. Mailed out post titers.

## 2022-12-20 ENCOUNTER — Encounter: Payer: Self-pay | Admitting: Allergy & Immunology

## 2022-12-20 ENCOUNTER — Ambulatory Visit: Payer: Commercial Managed Care - PPO | Admitting: Occupational Therapy

## 2022-12-20 DIAGNOSIS — K5900 Constipation, unspecified: Secondary | ICD-10-CM | POA: Diagnosis not present

## 2022-12-20 DIAGNOSIS — I890 Lymphedema, not elsewhere classified: Secondary | ICD-10-CM | POA: Diagnosis not present

## 2022-12-20 DIAGNOSIS — R109 Unspecified abdominal pain: Secondary | ICD-10-CM | POA: Diagnosis not present

## 2022-12-20 NOTE — Therapy (Unsigned)
OUTPATIENT OCCUPATIONAL THERAPY TREATMENT NOTE  LOWER EXTREMITY LYMPHEDEMA  Patient Name: Karen [name] MRN: [id number] DOB:29-Dec-1970, 52 y.o., female Today's Date: 12/21/2022  END OF SESSION:   OT End of Session - 12/20/22 1504     Visit Number 10    Number of Visits 36    Date for OT Re-Evaluation 02/08/23    OT Start Time 0303    OT Stop Time 0403    OT Time Calculation (min) 60 min    Activity Tolerance Patient tolerated treatment well;No increased pain    Behavior During Therapy [facility] for tasks assessed/performed               Past Medical History:  Diagnosis Date   Adenomyosis    Anemia    Arthritis 2013   L knee gets steroid injections    Asthmatic bronchitis 01/31/2017   Back pain    Chronic constipation    Diarrhea    DVT of lower extremity (deep venous thrombosis) (HCC)    Dysmenorrhea    Endometriosis    Esophageal stricture    GERD (gastroesophageal reflux disease)    History of kidney stones    History of uterine fibroid    IBS (irritable bowel syndrome)    Interstitial cystitis    Lactose intolerance    Migraine    with aura   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   PONV (postoperative nausea and vomiting)    Recurrent upper respiratory infection (URI)    Sebaceous cyst of breast, right lower inner quadrant 10/25/2012   Excised 11/21/12    Sleep apnea    Syncope, non cardiac    Past Surgical History:  Procedure Laterality Date   ARTHROSCOPIC HAGLUNDS REPAIR     BREAST CYST EXCISION Right 11/21/2012   Procedure: CYST EXCISION BREAST;  Surgeon: Currie Paris, MD;  Location: [facility];  Service: General;  Laterality: Right;   BREAST CYST EXCISION Bilateral 01/18/2022   Procedure: EXCISION OF SEBACEOUS CYST BILATERAL BREAST;  Surgeon: Harriette Bouillon, MD;  Location: [facility];  Service: General;  Laterality: Bilateral;   BREAST EXCISIONAL BIOPSY Right 11/2012   CESAREAN SECTION  04/19/1993   CHOLECYSTECTOMY      COLONOSCOPY  05/02/2011   Procedure: COLONOSCOPY;  Surgeon: Vertell Novak., MD;  Location: [facility];  Service: Endoscopy;  Laterality: N/A;   colonoscopy  11/04/2014   DENTAL SURGERY  03/06/2018   2 surgeries ( 03/06/2018 and 04/06/2018)   to remove two separate benign tumors   ESOPHAGEAL MANOMETRY N/A 11/04/2015   Procedure: ESOPHAGEAL MANOMETRY (EM);  Surgeon: Carman Ching, MD;  Location: [facility];  Service: Endoscopy;  Laterality: N/A;   ESOPHAGOGASTRODUODENOSCOPY  05/02/2011   Procedure: ESOPHAGOGASTRODUODENOSCOPY (EGD);  Surgeon: Vertell Novak., MD;  Location: [facility];  Service: Endoscopy;  Laterality: N/A;   ESOPHAGOGASTRODUODENOSCOPY N/A 09/01/2014   Procedure: ESOPHAGOGASTRODUODENOSCOPY (EGD);  Surgeon: Carman Ching, MD;  Location: [facility];  Service: Endoscopy;  Laterality: N/A;   KNEE SURGERY     LAPAROSCOPY     x 4   LESION REMOVAL N/A 01/18/2022   Procedure: EXCISION OF SEBACEOUS CYSTS CHEST AND NECK;  Surgeon: Harriette Bouillon, MD;  Location: [facility];  Service: General;  Laterality: N/A;   PH IMPEDANCE STUDY N/A 11/04/2015   Procedure: PH IMPEDANCE STUDY;  Surgeon: Carman Ching, MD;  Location: [facility];  Service: Endoscopy;  Laterality: N/A;   VAGINAL HYSTERECTOMY  2010   TVH--ovaries remain   Patient  Active Problem List   Diagnosis Date Noted   Sjogren syndrome with keratoconjunctivitis (HCC) 12/09/2022   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   Elevated LFTs 04/04/2021   Colitis 04/04/2021   Bacterial overgrowth syndrome 05/21/2020   Obesity 05/21/2020   History of endometriosis 05/21/2020   Constipation due to outlet dysfunction 02/05/2020   Gastroesophageal reflux disease 02/05/2020   Obstructive sleep apnea treated with continuous positive airway pressure (CPAP) 06/16/2017   Perennial allergic rhinitis with a nonallergic component 01/31/2017   Asthmatic bronchitis 01/31/2017   GI symptoms 01/31/2017   Family history of  colon cancer 01/27/2015   Chest pain 12/13/2012   Dyspnea 12/13/2012   DVT of lower extremity (deep venous thrombosis) (HCC) 01/03/1990    PCP: Coral Else, MD  REFERRING PROVIDER: Lorenda Ishihara, MD  REFERRING DIAG: I89.0  THERAPY DIAG:  Lymphedema, not elsewhere classified  Rationale for Evaluation and Treatment: Rehabilitation  ONSET DATE: ~2014; exacerbation in 2023  SUBJECTIVE:                                                                                                                                                                                           SUBJECTIVE STATEMENT: Cary returns to OT for Rx visit for BLE/BLQ lymphedema care. Pt denies lymphedema related leg pain. She reports increased feeling of heaviness in  L lower quadrant. Pt requests MLD to L leg today after having pelvic health appointment prior to OT today.   PERTINENT HISTORY: CVI, OSA (no CPAP)  PAIN:  Are you having pain? Yes: NPRS scale: not rated/10 Pain location: BLE Pain description: tight, heavy, tingling, numbness Aggravating factors: standing, walking, dependent sitting > 30 minutes Relieving factors: elevation  PRECAUTIONS: Other: LYMPHEDEMA; Hx LLE DVT  RED FLAGS: Hx LLE DVT  WEIGHT BEARING RESTRICTIONS: No  FALLS:  Has patient fallen in last 6 months? No  LIVING ENVIRONMENT: Lives with: lives with spouse and daughter Lives in: House/apartment Stairs: Yes; Internal: 14 steps; yes and External: 4 steps; yes Has following equipment at home: None  OCCUPATION: [employer] operational officer, walking, desk work  LEISURE: family time  HAND DOMINANCE: right   PRIOR LEVEL OF FUNCTION: Independent  PATIENT GOALS: Feel better, be able to be more active without pain, wear preferred street shoes  OBJECTIVE: Note: Objective measures were completed at Evaluation unless otherwise noted.  COGNITION:  Overall cognitive status: Within functional limits for tasks  assessed   OBSERVATIONS / OTHER ASSESSMENTS:   POSTURE: WFL  LE ROM: WFL  LE MMT: WFL  LYMPHEDEMA ASSESSMENTS: non-Ca related  INFECTIONS: denies cellulitis and wound hx  BLE COMPARATIVE LIMB VOLUMETRICS Initial 11/15/22  LANDMARK RIGHT (  dominant)  R LEG (A-D) 3028.9 ml  R THIGH (E-G) 4809.60 ml  R FULL LIMB (A-G) 7838.5 ml  Limb Volume differential (LVD)  %  Volume change since initial %  Volume change overall V  (Blank rows = not tested)  LANDMARK LEFT  (Rx)  L LEG (A-D) 3189.9 ml  L THIGH (E-G) 4959.8 ml  L FULL LIMB (A-G) 8149.7 ml  Limb Volume differential (LVD)  LEG LVD = 5.32%, L>R; THIGH LVD = 3.12%, L>R; And full LLE LVD = 3.97%, L>R. 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 Obesity  Skin  Description Hyper-Keratosis Peau d' Orange Shiny Tight Fibrotic/ Indurated Fatty Doughy Spongy/ boggy       R>L x  x   Skin dry Flaky WNL Macerated   mildly      Color Redness Varicosities Blanching Hemosiderin Stain Mottled        x   Odor Malodorous Yeast Fungal infection  WNL      x   Temperature Warm Cool wnl     x    Pitting Edema   1+ 2+ 3+ 4+ Non-pitting         x   Girth Symmetrical Asymmetrical                   Distribution    L>R toes to groin    Stemmer Sign Positive Negative   +L -R   Lymphorrhea History Of:  Present Absent     x    Wounds History Of Present Absent Venous Arterial Pressure Sheer     x        Signs of Infection Redness Warmth Erythema Acute Swelling Drainage Borders                    Sensation Light Touch Deep pressure Hypersensitivity   In tact Impaired In tact Impaired Absent Impaired   x  x  x     Nails WNL   Fungus nail dystrophy   x     Hair Growth Symmetrical Asymmetrical   x    Skin Creases Base of toes  Ankles   Base of Fingers knees       Abdominal pannus Thigh Lobules   Face/neck   x           GAIT: Distance walked: >500' Assistive device utilized: None Level of assistance: Complete Independence Comments: Functional ambulation and transfers [facility]  LYMPHEDEMA LIFE IMPACT SCALE (LLIS): Initial 11/15/22  50%  FOTO functional outcome measure: TBA initial Rx visit 2/2 time constraints  TODAY'S TREATMENT:  MLD to BLE/BLQ, emphasis on R Pt edu re lymphatic structure and function, abd LE self care home program  PATIENT EDUCATION:  Continued Pt/ CG edu for lymphedema self care home program throughout session. Topics include outcome of comparative limb volumetrics- starting limb volume differentials (LVDs), technology and gradient techniques used for short stretch, multilayer compression wrapping, simple self-MLD, therapeutic lymphatic pumping exercises, skin/nail care, LE precautions,. compression garment recommendations and specifications, wear and care schedule and compression garment donning / doffing w assistive devices. Discussed progress towards all OT goals since commencing CDT. All questions answered to the Pt's satisfaction. Good return. Person educated: Patient  Education method: Explanation, Demonstration, and Handouts Education comprehension: verbalized understanding, returned demonstration, verbal cues required, and needs further education  HOME EXERCISE PROGRAM: BLE lymphatic pumping there ex using- 1 sets of 10 reps, each exercise in order-  1-2 x daily, bilaterally Simple self MLD 1 x daily Daily skin care to increase hydration, skin mobility and decrease infection risk- can be done during MLD Compression wraps 23/7 during intensive phase of CDT Compression garments/ devices during self-management phase of CDT  ASSESSMENT:  CLINICAL IMPRESSION:  Pt tolerated BLE/ BLQ MLD as established today. Leg swelling  appears unchanged bilaterally after session. Pt reports some relief from leg pain at end of session. Compression garments are ordered. Awaiting fitting. Cont as per POC.  11/15/22 Initial Evaluation: Cesilia Amie is a 52 y.o. female presenting with very mild, stage II, LLE lymphedema 2/2 venous insufficiency and obesity. L Leg swelling and associated pain swelling fluctuates. It has progressed over time, and now no longer resolves with elevation over night. RLE swelling limits balance during functional ambulation. It exacerbates infection and falls risk. LLE/RLQ lymphedema interferes with functional performance in all occupational domains, including basic and instrumental ADLs, productive activities, leisure pursuits and social participation. Pt will benefit from Occupational Therapy for Complete Decongestive Therapy (CDT) to restore function, to reduce physical and psychologic suffering associated with chronic, progressive lymphedema and associated pain, and to limit infection. CDT will include manual lymphatic drainage (MLD), skin care, therapeutic exercise and compression wraps and garment's devices. Without skilled OT for lymphedema care the condition will worsen and further functional decline is expected  Custom-made gradient compression garments and HOS devices are medically necessary because they are uniquely sized and shaped to fit the exact dimensions of the affected extremities, and to provide appropriate medical grade, graduated compression essential for optimally managing chronic, progressive lymphedema. Multiple custom compression garments are needed to ensure proper hygiene to limit infection risk. Custom compression garments should be replaced q 3-6 months When worn consistently for optimal lipo-lymphedema self-management over time. HOS devices, medically necessary to limit fibrosis buildup in tissue, should be replaced q 2 years and PRN when worn out.      OBJECTIVE IMPAIRMENTS: decreased  activity tolerance, decreased knowledge of condition, decreased knowledge of use of DME, increased edema, impaired sensation, pain, and chronic, progressive leg swelling .   ACTIVITY LIMITATIONS: dependent sitting, extended standing and/ or walking, squatting, and lower body dressing, fitting preferred street shoes  PARTICIPATION LIMITATIONS: shopping, community activity, occupation, and yard work  PERSONAL FACTORS: 3+ comorbidities: Hx DVT,  OSA, CVI. Varicose veins  are also affecting patient's functional outcome.   REHAB POTENTIAL: Good  EVALUATION COMPLEXITY: Moderate   GOALS: Goals reviewed with patient? Yes  SHORT TERM GOALS: Target date: 4th OT Rx visit  SHORT TERM GOALS: Target date: 4th OT Rx visit   Pt will demonstrate understanding of lymphedema  precautions and prevention strategies with modified independence using a printed reference to identify at least 5 precautions and discussing how s/he may implement them into daily life to reduce risk of progression with extra time. Baseline:Max A Goal status: INITIAL   Pt will be able to apply multilayer, knee length, gradient, compression wraps to one leg at a time with modified assistance (extra time and assistive device/s) to decrease limb volume, to limit infection risk, and to limit lymphedema progression.  Baseline: Dependent Goal status: INITIAL  LONG TERM GOALS: Target date: 02/08/23  Given this patient's Intake score of TBA /100% on the functional outcomes FOTO tool, patient will experience an increase in function of 5 points to improve basic and instrumental ADLs performance, including lymphedema self-care.  Baseline: Max A Goal status: INITIAL   Given this patient's Intake score of TBA% on the Lymphedema Life Impact Scale (LLIS), patient will experience a reduction of at least 5 points in her perceived level of functional impairment resulting from lymphedema to improve functional performance and quality of life  (QOL). Baseline: 64.71% Goal status: INITIAL  3 Pt will achieve at least a 10% volume reduction in B legs to return limb to typical size and shape, to limit infection risk and LE progression, to decrease pain, to improve function. Baseline: Dependent Goal status: INITIAL  4.  Pt will obtain proper compression garments/devices and achieve modified independence (extra time + assistive devices) with donning/doffing to optimize limb volume reductions and limit LE  progression over time. Baseline:  Goal status: INITIAL  5.  During Intensive phase CDT , with modified independence, Pt will achieve at least 85% compliance with all lymphedema self-care home program components, including daily skin care, compression wraps and /or garments, simple self MLD and lymphatic pumping therex to habituate LE self care protocol  into ADLs for optimal LE self-management over time. Baseline: Dependent Goal status: INITIAL   PLAN:  PT FREQUENCY: 2x/week  PT DURATION: 12 weeks  PLANNED INTERVENTIONS: 97110-Therapeutic exercises, 97530- Therapeutic activity, 97535- Self Care, 16109- Manual therapy, Patient/Family education, Manual lymph drainage, Compression bandaging, and fit with appropriate compression garments  PLAN FOR NEXT SESSION:  BLE comparative limb volumetrics Pt edu for LE self care  Loel Dubonnet, MS, OTR/L, CLT-LANA 12/21/22 1:50 PM '

## 2022-12-21 ENCOUNTER — Other Ambulatory Visit (HOSPITAL_COMMUNITY): Payer: Self-pay

## 2022-12-21 MED ORDER — HYDROXYCHLOROQUINE SULFATE 200 MG PO TABS
200.0000 mg | ORAL_TABLET | Freq: Two times a day (BID) | ORAL | 3 refills | Status: DC
  Filled 2022-12-21: qty 60, 30d supply, fill #0
  Filled 2023-02-16: qty 60, 30d supply, fill #1
  Filled 2023-03-29: qty 60, 30d supply, fill #2

## 2022-12-22 ENCOUNTER — Ambulatory Visit
Admission: RE | Admit: 2022-12-22 | Discharge: 2022-12-22 | Disposition: A | Discharge status: Home / Self Care | Payer: Commercial Managed Care - PPO | Source: Ambulatory Visit | Attending: Internal Medicine | Admitting: Internal Medicine

## 2022-12-22 ENCOUNTER — Ambulatory Visit: Payer: Commercial Managed Care - PPO | Admitting: Occupational Therapy

## 2022-12-22 ENCOUNTER — Encounter: Payer: Self-pay | Admitting: *Deleted

## 2022-12-22 ENCOUNTER — Encounter: Payer: Self-pay | Admitting: Occupational Therapy

## 2022-12-22 DIAGNOSIS — N6310 Unspecified lump in the right breast, unspecified quadrant: Secondary | ICD-10-CM | POA: Diagnosis not present

## 2022-12-22 DIAGNOSIS — I890 Lymphedema, not elsewhere classified: Secondary | ICD-10-CM

## 2022-12-22 DIAGNOSIS — N644 Mastodynia: Secondary | ICD-10-CM | POA: Diagnosis not present

## 2022-12-22 DIAGNOSIS — L720 Epidermal cyst: Secondary | ICD-10-CM | POA: Diagnosis not present

## 2022-12-22 NOTE — Therapy (Signed)
OUTPATIENT OCCUPATIONAL THERAPY TREATMENT NOTE  LOWER EXTREMITY LYMPHEDEMA  Patient Name: Karen [name] MRN: [id number] DOB:07-26-1970, 52 y.o., female Today's Date: 12/23/2022  END OF SESSION:   OT End of Session - 12/22/22 1531     Visit Number 11    Number of Visits 36    Date for OT Re-Evaluation 02/08/23    OT Start Time 0320    OT Stop Time 0413    OT Time Calculation (min) 53 min    Activity Tolerance Patient tolerated treatment well;No increased pain    Behavior During Therapy [facility] for tasks assessed/performed               Past Medical History:  Diagnosis Date   Adenomyosis    Anemia    Arthritis 2013   L knee gets steroid injections    Asthmatic bronchitis 01/31/2017   Back pain    Chronic constipation    Diarrhea    DVT of lower extremity (deep venous thrombosis) (HCC)    Dysmenorrhea    Endometriosis    Esophageal stricture    GERD (gastroesophageal reflux disease)    History of kidney stones    History of uterine fibroid    IBS (irritable bowel syndrome)    Interstitial cystitis    Lactose intolerance    Migraine    with aura   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   PONV (postoperative nausea and vomiting)    Recurrent upper respiratory infection (URI)    Sebaceous cyst of breast, right lower inner quadrant 10/25/2012   Excised 11/21/12    Sleep apnea    Syncope, non cardiac    Past Surgical History:  Procedure Laterality Date   ARTHROSCOPIC HAGLUNDS REPAIR     BREAST CYST EXCISION Right 11/21/2012   Procedure: CYST EXCISION BREAST;  Surgeon: Currie Paris, MD;  Location: [facility];  Service: General;  Laterality: Right;   BREAST CYST EXCISION Bilateral 01/18/2022   Procedure: EXCISION OF SEBACEOUS CYST BILATERAL BREAST;  Surgeon: Harriette Bouillon, MD;  Location: [facility];  Service: General;  Laterality: Bilateral;   BREAST EXCISIONAL BIOPSY Right 11/2012   CESAREAN SECTION  04/19/1993   CHOLECYSTECTOMY      COLONOSCOPY  05/02/2011   Procedure: COLONOSCOPY;  Surgeon: Vertell Novak., MD;  Location: [facility];  Service: Endoscopy;  Laterality: N/A;   colonoscopy  11/04/2014   DENTAL SURGERY  03/06/2018   2 surgeries ( 03/06/2018 and 04/06/2018)   to remove two separate benign tumors   ESOPHAGEAL MANOMETRY N/A 11/04/2015   Procedure: ESOPHAGEAL MANOMETRY (EM);  Surgeon: Carman Ching, MD;  Location: [facility];  Service: Endoscopy;  Laterality: N/A;   ESOPHAGOGASTRODUODENOSCOPY  05/02/2011   Procedure: ESOPHAGOGASTRODUODENOSCOPY (EGD);  Surgeon: Vertell Novak., MD;  Location: [facility];  Service: Endoscopy;  Laterality: N/A;   ESOPHAGOGASTRODUODENOSCOPY N/A 09/01/2014   Procedure: ESOPHAGOGASTRODUODENOSCOPY (EGD);  Surgeon: Carman Ching, MD;  Location: [facility];  Service: Endoscopy;  Laterality: N/A;   KNEE SURGERY     LAPAROSCOPY     x 4   LESION REMOVAL N/A 01/18/2022   Procedure: EXCISION OF SEBACEOUS CYSTS CHEST AND NECK;  Surgeon: Harriette Bouillon, MD;  Location: [facility];  Service: General;  Laterality: N/A;   PH IMPEDANCE STUDY N/A 11/04/2015   Procedure: PH IMPEDANCE STUDY;  Surgeon: Carman Ching, MD;  Location: [facility];  Service: Endoscopy;  Laterality: N/A;   VAGINAL HYSTERECTOMY  2010   TVH--ovaries remain   Patient  Active Problem List   Diagnosis Date Noted   Sjogren syndrome with keratoconjunctivitis (HCC) 12/09/2022   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   Elevated LFTs 04/04/2021   Colitis 04/04/2021   Bacterial overgrowth syndrome 05/21/2020   Obesity 05/21/2020   History of endometriosis 05/21/2020   Constipation due to outlet dysfunction 02/05/2020   Gastroesophageal reflux disease 02/05/2020   Obstructive sleep apnea treated with continuous positive airway pressure (CPAP) 06/16/2017   Perennial allergic rhinitis with a nonallergic component 01/31/2017   Asthmatic bronchitis 01/31/2017   GI symptoms 01/31/2017   Family history of  colon cancer 01/27/2015   Chest pain 12/13/2012   Dyspnea 12/13/2012   DVT of lower extremity (deep venous thrombosis) (HCC) 01/03/1990    PCP: Coral Else, MD  REFERRING PROVIDER: Lorenda Ishihara, MD  REFERRING DIAG: I89.0  THERAPY DIAG:  Lymphedema, not elsewhere classified  Rationale for Evaluation and Treatment: Rehabilitation  ONSET DATE: ~2014; exacerbation in 2023  SUBJECTIVE:                                                                                                                                                                                           SUBJECTIVE STATEMENT: [name] returns to OT for Rx visit for BLE/BLQ lymphedema care. Pt denies lymphedema related leg pain. She reports increased feeling of heaviness in  L lower quadrant. Pt requests MLD to L leg today after having pelvic health appointment prior to OT today.   PERTINENT HISTORY: CVI, OSA (no CPAP)  PAIN:  Are you having pain? Yes: NPRS scale: not rated/10 Pain location: BLE Pain description: tight, heavy, tingling, numbness Aggravating factors: standing, walking, dependent sitting > 30 minutes Relieving factors: elevation  PRECAUTIONS: Other: LYMPHEDEMA; Hx LLE DVT  RED FLAGS: Hx LLE DVT  WEIGHT BEARING RESTRICTIONS: No  FALLS:  Has patient fallen in last 6 months? No  LIVING ENVIRONMENT: Lives with: lives with spouse and daughter Lives in: House/apartment Stairs: Yes; Internal: 14 steps; yes and External: 4 steps; yes Has following equipment at home: None  OCCUPATION: Diplomatic Services operational officer, walking, desk work  LEISURE: family time  HAND DOMINANCE: right   PRIOR LEVEL OF FUNCTION: Independent  PATIENT GOALS: Feel better, be able to be more active without pain, wear preferred street shoes  OBJECTIVE: Note: Objective measures were completed at Evaluation unless otherwise noted.  COGNITION:  Overall cognitive status: Within functional limits for tasks  assessed   OBSERVATIONS / OTHER ASSESSMENTS:   POSTURE: WFL  LE ROM: WFL  LE MMT: WFL  LYMPHEDEMA ASSESSMENTS: non-Ca related  INFECTIONS: denies cellulitis and wound hx  BLE COMPARATIVE LIMB VOLUMETRICS Initial 11/15/22  LANDMARK RIGHT (  dominant)  R LEG (A-D) 3028.9 ml  R THIGH (E-G) 4809.60 ml  R FULL LIMB (A-G) 7838.5 ml  Limb Volume differential (LVD)  %  Volume change since initial %  Volume change overall V  (Blank rows = not tested)  LANDMARK LEFT  (Rx)  L LEG (A-D) 3189.9 ml  L THIGH (E-G) 4959.8 ml  L FULL LIMB (A-G) 8149.7 ml  Limb Volume differential (LVD)  LEG LVD = 5.32%, L>R; THIGH LVD = 3.12%, L>R; And full LLE LVD = 3.97%, L>R. 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 Obesity  Skin  Description Hyper-Keratosis Peau d' Orange Shiny Tight Fibrotic/ Indurated Fatty Doughy Spongy/ boggy       R>L x  x   Skin dry Flaky WNL Macerated   mildly      Color Redness Varicosities Blanching Hemosiderin Stain Mottled        x   Odor Malodorous Yeast Fungal infection  WNL      x   Temperature Warm Cool wnl     x    Pitting Edema   1+ 2+ 3+ 4+ Non-pitting         x   Girth Symmetrical Asymmetrical                   Distribution    L>R toes to groin    Stemmer Sign Positive Negative   +L -R   Lymphorrhea History Of:  Present Absent     x    Wounds History Of Present Absent Venous Arterial Pressure Sheer     x        Signs of Infection Redness Warmth Erythema Acute Swelling Drainage Borders                    Sensation Light Touch Deep pressure Hypersensitivity   In tact Impaired In tact Impaired Absent Impaired   x  x  x     Nails WNL   Fungus nail dystrophy   x     Hair Growth Symmetrical Asymmetrical   x    Skin Creases Base of toes  Ankles   Base of Fingers knees       Abdominal pannus Thigh Lobules   Face/neck   x           GAIT: Distance walked: >500' Assistive device utilized: None Level of assistance: Complete Independence Comments: Functional ambulation and transfers [facility]  LYMPHEDEMA LIFE IMPACT SCALE (LLIS): Initial 11/15/22  50%  FOTO functional outcome measure: TBA initial Rx visit 2/2 time constraints  TODAY'S TREATMENT:  MLD to BLE/BLQ, emphasis on R Pt edu re lymphatic structure and function, abd LE self care home program  PATIENT EDUCATION:  Continued Pt/ CG edu for lymphedema self care home program throughout session. Topics include outcome of comparative limb volumetrics- starting limb volume differentials (LVDs), technology and gradient techniques used for short stretch, multilayer compression wrapping, simple self-MLD, therapeutic lymphatic pumping exercises, skin/nail care, LE precautions,. compression garment recommendations and specifications, wear and care schedule and compression garment donning / doffing w assistive devices. Discussed progress towards all OT goals since commencing CDT. All questions answered to the Pt's satisfaction. Good return. Person educated: Patient  Education method: Explanation, Demonstration, and Handouts Education comprehension: verbalized understanding, returned demonstration, verbal cues required, and needs further education  HOME EXERCISE PROGRAM: BLE lymphatic pumping there ex using- 1 sets of 10 reps, each exercise in order-  1-2 x daily, bilaterally Simple self MLD 1 x daily Daily skin care to increase hydration, skin mobility and decrease infection risk- can be done during MLD Compression wraps 23/7 during intensive phase of CDT Compression garments/ devices during self-management phase of CDT  ASSESSMENT:  CLINICAL IMPRESSION:  Pt tolerated BLE/ BLQ MLD as established today. Leg swelling  appears unchanged bilaterally after session. Pt reports some relief from leg pain at end of session. Compression garments are ordered. Awaiting fitting. Cont as per POC.  11/15/22 Initial Evaluation: Karen [name] is a 52 y.o. female presenting with very mild, stage II, LLE lymphedema 2/2 venous insufficiency and obesity. L Leg swelling and associated pain swelling fluctuates. It has progressed over time, and now no longer resolves with elevation over night. RLE swelling limits balance during functional ambulation. It exacerbates infection and falls risk. LLE/RLQ lymphedema interferes with functional performance in all occupational domains, including basic and instrumental ADLs, productive activities, leisure pursuits and social participation. Pt will benefit from Occupational Therapy for Complete Decongestive Therapy (CDT) to restore function, to reduce physical and psychologic suffering associated with chronic, progressive lymphedema and associated pain, and to limit infection. CDT will include manual lymphatic drainage (MLD), skin care, therapeutic exercise and compression wraps and garment's devices. Without skilled OT for lymphedema care the condition will worsen and further functional decline is expected  Custom-made gradient compression garments and HOS devices are medically necessary because they are uniquely sized and shaped to fit the exact dimensions of the affected extremities, and to provide appropriate medical grade, graduated compression essential for optimally managing chronic, progressive lymphedema. Multiple custom compression garments are needed to ensure proper hygiene to limit infection risk. Custom compression garments should be replaced q 3-6 months When worn consistently for optimal lipo-lymphedema self-management over time. HOS devices, medically necessary to limit fibrosis buildup in tissue, should be replaced q 2 years and PRN when worn out.      OBJECTIVE IMPAIRMENTS: decreased  activity tolerance, decreased knowledge of condition, decreased knowledge of use of DME, increased edema, impaired sensation, pain, and chronic, progressive leg swelling .   ACTIVITY LIMITATIONS: dependent sitting, extended standing and/ or walking, squatting, and lower body dressing, fitting preferred street shoes  PARTICIPATION LIMITATIONS: shopping, community activity, occupation, and yard work  PERSONAL FACTORS: 3+ comorbidities: Hx DVT,  OSA, CVI. Varicose veins  are also affecting patient's functional outcome.   REHAB POTENTIAL: Good  EVALUATION COMPLEXITY: Moderate   GOALS: Goals reviewed with patient? Yes  SHORT TERM GOALS: Target date: 4th OT Rx visit  SHORT TERM GOALS: Target date: 4th OT Rx visit   Pt will demonstrate understanding of lymphedema  precautions and prevention strategies with modified independence using a printed reference to identify at least 5 precautions and discussing how s/he may implement them into daily life to reduce risk of progression with extra time. Baseline:Max A Goal status: INITIAL   Pt will be able to apply multilayer, knee length, gradient, compression wraps to one leg at a time with modified assistance (extra time and assistive device/s) to decrease limb volume, to limit infection risk, and to limit lymphedema progression.  Baseline: Dependent Goal status: INITIAL  LONG TERM GOALS: Target date: 02/08/23  Given this patient's Intake score of TBA /100% on the functional outcomes FOTO tool, patient will experience an increase in function of 5 points to improve basic and instrumental ADLs performance, including lymphedema self-care.  Baseline: Max A Goal status: INITIAL   Given this patient's Intake score of TBA% on the Lymphedema Life Impact Scale (LLIS), patient will experience a reduction of at least 5 points in her perceived level of functional impairment resulting from lymphedema to improve functional performance and quality of life  (QOL). Baseline: 64.71% Goal status: INITIAL  3 Pt will achieve at least a 10% volume reduction in B legs to return limb to typical size and shape, to limit infection risk and LE progression, to decrease pain, to improve function. Baseline: Dependent Goal status: INITIAL  4.  Pt will obtain proper compression garments/devices and achieve modified independence (extra time + assistive devices) with donning/doffing to optimize limb volume reductions and limit LE  progression over time. Baseline:  Goal status: INITIAL  5.  During Intensive phase CDT , with modified independence, Pt will achieve at least 85% compliance with all lymphedema self-care home program components, including daily skin care, compression wraps and /or garments, simple self MLD and lymphatic pumping therex to habituate LE self care protocol  into ADLs for optimal LE self-management over time. Baseline: Dependent Goal status: INITIAL   PLAN:  PT FREQUENCY: 2x/week  PT DURATION: 12 weeks  PLANNED INTERVENTIONS: 97110-Therapeutic exercises, 97530- Therapeutic activity, 97535- Self Care, 19147- Manual therapy, Patient/Family education, Manual lymph drainage, Compression bandaging, and fit with appropriate compression garments  PLAN FOR NEXT SESSION:  BLE comparative limb volumetrics Pt edu for LE self care  Loel Dubonnet, MS, OTR/L, CLT-LANA 12/23/22 10:54 AM '

## 2022-12-22 NOTE — Telephone Encounter (Signed)
I spoke with the patient.  We discussed her sleep study results.  At this time the patient wants to pursue weight loss as she unfortunately is already losing some weight unintentionally due to GI issues that she is currently having. She states she is 195 pounds at this time.  She typically sleeps on her sides but she will avoid her back.  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 She will let us know if she changes her mind about the AutoPap and she will follow-up as scheduled in May.  Her questions were answered and she verbalized appreciation for the call.

## 2022-12-22 NOTE — Telephone Encounter (Signed)
Noted, thank you

## 2022-12-22 NOTE — Progress Notes (Signed)
Updating pt's weight. She reports current weight as 195 lb.

## 2022-12-23 ENCOUNTER — Ambulatory Visit (HOSPITAL_BASED_OUTPATIENT_CLINIC_OR_DEPARTMENT_OTHER): Payer: Commercial Managed Care - PPO | Admitting: Obstetrics & Gynecology

## 2022-12-23 ENCOUNTER — Ambulatory Visit (INDEPENDENT_AMBULATORY_CARE_PROVIDER_SITE_OTHER): Payer: Commercial Managed Care - PPO

## 2022-12-23 ENCOUNTER — Telehealth: Payer: Self-pay | Admitting: *Deleted

## 2022-12-23 ENCOUNTER — Encounter (HOSPITAL_BASED_OUTPATIENT_CLINIC_OR_DEPARTMENT_OTHER): Payer: Self-pay | Admitting: Obstetrics & Gynecology

## 2022-12-23 VITALS — BP 113/84 | HR 78 | Ht 67.5 in | Wt 199.0 lb

## 2022-12-23 DIAGNOSIS — B999 Unspecified infectious disease: Secondary | ICD-10-CM | POA: Diagnosis not present

## 2022-12-23 DIAGNOSIS — Z23 Encounter for immunization: Secondary | ICD-10-CM

## 2022-12-23 DIAGNOSIS — R768 Other specified abnormal immunological findings in serum: Secondary | ICD-10-CM | POA: Diagnosis not present

## 2022-12-23 DIAGNOSIS — L292 Pruritus vulvae: Secondary | ICD-10-CM

## 2022-12-23 NOTE — Telephone Encounter (Signed)
Patient is supposed to start Plaquenil 200 mg tablet prescribed by her rheumatologist.   Is it ok for her to go ahead and start or does she still have to wait. Patient got her vaccine today.

## 2022-12-23 NOTE — Telephone Encounter (Signed)
Called and spoke with the patient, she stated that she was going to start Plaquenil 250mg  BID from the doctor for 2 cysts that are in her hand and are causing her pain. She states that she can wait 4 weeks and start the medication then.

## 2022-12-23 NOTE — Telephone Encounter (Signed)
FYI: Karen [name] wanted you to be aware that she will be starting an immunosuppressant drug after she receives her Pneumovax from [place]. She said you can call her with any questions or concerns.

## 2022-12-23 NOTE — Telephone Encounter (Signed)
Can we find out what drug it is? Any chance she can wait a month before starting the immunosuppressant after getting the vaccination?   Malachi Bonds, MD [facility]

## 2022-12-23 NOTE — Telephone Encounter (Signed)
Sounds good - let's hold off on the Plaquenil until four weeks after the vaccine. Agree!

## 2022-12-23 NOTE — Progress Notes (Signed)
GYNECOLOGY  VISIT  CC:   vulvar recheck  HPI: 52 y.o. G2P2 Married [place] or [place] American female here for follow up of vulvar and perianal irritation/itching.  Feels skin feels worse around the rectum, almost leathery.  Feels the skin burns with showing, even just with water, with wiping, and with urination.  Uses cottonelle wipes and often tries to "air dry".  Aquaphor sometimes burns as well.  Also is using nifedpine and lidocaine around the rectum and this sometimes burns.  Has seen Dr. Nickola Major, rheumatology, about three weeks ago.  ANA is mildly elevated.  Alk phos was mildly elevated but stable.  She has developed two cysts in her hand/wrist.  Plaquenil has been prescribed but she hasn't started it yet.   Has used tacrolimus and this really burns her skin.     Has been referred to Dr. Delanna Ahmadi.  Has appt at the end of February.  Would like me to call and see if can be seen sooner.     Past Medical History:  Diagnosis Date   Adenomyosis    Anemia    Arthritis 2013   L knee gets steroid injections    Asthmatic bronchitis 01/31/2017   Back pain    Chronic constipation    Diarrhea    DVT of lower extremity (deep venous thrombosis) (HCC)    Dysmenorrhea    Endometriosis    Esophageal stricture    GERD (gastroesophageal reflux disease)    History of kidney stones    History of uterine fibroid    IBS (irritable bowel syndrome)    Interstitial cystitis    Lactose intolerance    Migraine    with aura   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   PONV (postoperative nausea and vomiting)    Recurrent upper respiratory infection (URI)    Sebaceous cyst of breast, right lower inner quadrant 10/25/2012   Excised 11/21/12    Sleep apnea    Syncope, non cardiac     MEDS:   Current Outpatient Medications on File Prior to Visit  Medication Sig Dispense Refill   Acetaminophen 500 MG capsule 1 capsule as needed Orally every 6 hrs     albuterol (VENTOLIN HFA) 108 (90 Base) MCG/ACT inhaler  Inhale 2 puffs into the lungs every 6 (six) hours as needed for wheezing or shortness of breath. 6.7 g 2   Elderberry 575 MG/5ML SYRP Take 30 mLs by mouth daily.     fluticasone (FLONASE) 50 MCG/ACT nasal spray Place 2 sprays into both nostrils daily. 16 g 5   Lactobacillus Casei-Folic Acid (RESTORA RX) 60-1.25 MG CAPS Take 1 capsule by mouth every morning. 30 capsule 11   lansoprazole (PREVACID) 30 MG capsule Take 1 capsule (30 mg total) by mouth daily. 90 capsule 3   levocetirizine (XYZAL) 5 MG tablet Take 1 tablet (5 mg total) by mouth every evening. 90 tablet 1   Lifitegrast (XIIDRA) 5 % SOLN Place 1 drop into both eyes 2 (two) times daily. 180 each 3   ondansetron (ZOFRAN-ODT) 4 MG disintegrating tablet Dissolve 1 tablet (4 mg total) in mouth every 8 (eight) hours as needed for nausea 30 tablet 2   polyethylene glycol (MIRALAX / GLYCOLAX) 17 g packet Take 17 g by mouth every other day.     UNABLE TO FIND Med Name: NIFEDIPINE 0.3%/LIDO1.5% OINT     gabapentin (NEURONTIN) 100 MG capsule Take 1 capsule (100 mg total) by mouth at bedtime for 7 days, THEN 1 capsule (100 mg total)  2 (two) times daily for 7 days, THEN 1 capsule (100 mg total) 3 (three) times daily. 90 capsule 0   hydroxychloroquine (PLAQUENIL) 200 MG tablet Take 1 tablet (200 mg total) by mouth 2 (two) times daily. (Patient not taking: Reported on 12/23/2022) 60 tablet 3   hydrOXYzine (ATARAX) 25 MG tablet Take 1 tablet (25 mg total) by mouth at bedtime as needed. (Patient not taking: Reported on 12/23/2022) 30 tablet 1   Prucalopride Succinate (MOTEGRITY) 1 MG TABS Take 1 tablet by mouth daily. (Patient not taking: Reported on 12/23/2022) 90 tablet 1   Prucalopride Succinate (MOTEGRITY) 2 MG TABS Take 1 tablet (2 mg total) by mouth daily. (Patient not taking: Reported on 12/23/2022) 90 tablet 2   Prucalopride Succinate 2 MG TABS Take 1 tablet (2 mg total) by mouth daily. (Patient not taking: Reported on 12/23/2022) 90 tablet 1    rizatriptan (MAXALT) 5 MG tablet Take 1 tablet (5 mg total) by mouth as needed for migraine. May repeat in 2 hours if needed, no more than 2 pills/24 hours, no more than 3 pills/week. (Patient not taking: Reported on 12/23/2022) 10 tablet 0   tacrolimus (PROTOPIC) 0.1 % ointment Apply a thin layer to affected areas twice daily to begin, and then decrease to daily for maintenance. (Patient not taking: Reported on 12/23/2022) 30 g 3   No current facility-administered medications on file prior to visit.    ALLERGIES: Molds & smuts  SH:  married, non smoker  Review of Systems  Constitutional: Negative.   Genitourinary: Negative.     PHYSICAL EXAMINATION:    BP 113/84 (BP Location: Right Arm, Patient Position: Sitting, Cuff Size: Large)   Pulse 78   Ht 5' 7.5" (1.715 m) Comment: Reported  Wt 199 lb (90.3 kg)   BMI 30.71 kg/m     General appearance: alert, cooperative and appears stated age Lymph:  no inguinal LAD noted  Pelvic: External genitalia:  no lesions but does have three small skin fissures on perineum              Assessment/Plan: 1. Vulvar itching (Primary) - she will continue the topical steroid and change to topical vaseline instead of aquaphor - will call Dr. Delanna Ahmadi- [facility] to see if we can get the appt sooner. - does have atarax but hasn't taken it  2. Positive ANA (antinuclear antibody) - followed by Dr. Nickola Major - plaquenil has been ordered.  Discussed what I see as pros/cons.

## 2022-12-23 NOTE — Progress Notes (Signed)
Pneumococcal Vaccination Consent signed  LOT #: [id number] EXP DATE: 01/27/2024  SITE OF INJECTION: L DELTOID  Patient waited in office for 15 minutes without any issues.

## 2022-12-29 ENCOUNTER — Inpatient Hospital Stay: Payer: Commercial Managed Care - PPO

## 2022-12-29 ENCOUNTER — Inpatient Hospital Stay (HOSPITAL_BASED_OUTPATIENT_CLINIC_OR_DEPARTMENT_OTHER): Payer: Commercial Managed Care - PPO | Admitting: Hematology & Oncology

## 2022-12-29 ENCOUNTER — Encounter: Payer: Self-pay | Admitting: Hematology & Oncology

## 2022-12-29 LAB — CBC WITH DIFFERENTIAL (CANCER CENTER ONLY)
Abs Immature Granulocytes: 0.01 10*3/uL (ref 0.00–0.07)
Basophils Absolute: 0.1 10*3/uL (ref 0.0–0.1)
Basophils Relative: 1 %
Eosinophils Absolute: 0.2 10*3/uL (ref 0.0–0.5)
Eosinophils Relative: 5 %
HCT: 36.9 % (ref 36.0–46.0)
Hemoglobin: 12.1 g/dL (ref 12.0–15.0)
Immature Granulocytes: 0 %
Lymphocytes Relative: 27 %
Lymphs Abs: 1.2 10*3/uL (ref 0.7–4.0)
MCH: 32.4 pg (ref 26.0–34.0)
MCHC: 32.8 g/dL (ref 30.0–36.0)
MCV: 98.9 fL (ref 80.0–100.0)
Monocytes Absolute: 0.4 10*3/uL (ref 0.1–1.0)
Monocytes Relative: 8 %
Neutro Abs: 2.7 10*3/uL (ref 1.7–7.7)
Neutrophils Relative %: 59 %
Platelet Count: 182 10*3/uL (ref 150–400)
RBC: 3.73 MIL/uL — ABNORMAL LOW (ref 3.87–5.11)
RDW: 12.1 % (ref 11.5–15.5)
WBC Count: 4.5 10*3/uL (ref 4.0–10.5)
nRBC: 0 % (ref 0.0–0.2)

## 2022-12-29 LAB — CMP (CANCER CENTER ONLY)
ALT: 12 U/L (ref 0–44)
AST: 13 U/L — ABNORMAL LOW (ref 15–41)
Albumin: 4.1 g/dL (ref 3.5–5.0)
Alkaline Phosphatase: 120 U/L (ref 38–126)
Anion gap: 8 (ref 5–15)
BUN: 14 mg/dL (ref 6–20)
CO2: 26 mmol/L (ref 22–32)
Calcium: 9.4 mg/dL (ref 8.9–10.3)
Chloride: 105 mmol/L (ref 98–111)
Creatinine: 0.72 mg/dL (ref 0.44–1.00)
GFR, Estimated: >60 mL/min (ref >60)
Glucose, Bld: 104 mg/dL — ABNORMAL HIGH (ref 70–99)
Potassium: 4 mmol/L (ref 3.5–5.1)
Sodium: 139 mmol/L (ref 135–145)
Total Bilirubin: 0.5 mg/dL (ref <1.2)
Total Protein: 6.9 g/dL (ref 6.5–8.1)

## 2022-12-29 LAB — FERRITIN: Ferritin: 290 ng/mL (ref 11–307)

## 2022-12-29 LAB — IRON AND IRON BINDING CAPACITY (CC-WL,HP ONLY)
Iron: 88 ug/dL (ref 28–170)
Saturation Ratios: 32 % — ABNORMAL HIGH (ref 10.4–31.8)
TIBC: 273 ug/dL (ref 250–450)
UIBC: 185 ug/dL (ref 148–442)

## 2022-12-29 LAB — VITAMIN B12: Vitamin B-12: 602 pg/mL (ref 180–914)

## 2022-12-29 NOTE — Progress Notes (Signed)
Hematology and Oncology Follow Up Visit  Karen [name] [id number] 25-Feb-1970 52 y.o. 12/29/2022   Principle Diagnosis:  Hemochromatosis, heterozygous H63D mutation  Current Therapy:   Observation   Interim History:  Karen [name] is here today for follow-up.  Unfortunately, looks that she is still having some issues.  Looks like she may be having some issues with rheumatologic problems.  She did see Rheumatology.  I think she was initially placed on Plaquenil.  However, I am unsure if she is really has started this yet.  She is still having some GI issues.  She is having some diarrhea.  I know that she has had problems with small bowel overgrowth.  She does have the hemochromatosis.  This she is heterozygous for the mutation.  However, this was not been a problem for her.  Her last iron saturation was only 20%.  We are using her iron saturation as a measure of iron overload.  Her ferritin will always be elevated secondary to her chronic inflammatory state.  She has had no cough.  She has had no chest wall pain.  She has had no leg swelling.  There is been no bleeding.  Overall, I would say performance status is probably ECOG 1.     Medications:  Allergies as of 12/29/2022       Reactions   Molds & Smuts Other (See Comments), Cough   sneezing        Medication List        Accurate as of December 29, 2022  1:30 PM. If you have any questions, ask your nurse or doctor.          STOP taking these medications    gabapentin 100 MG capsule Commonly known as: NEURONTIN Stopped by: Josph Macho       TAKE these medications    Acetaminophen 500 MG capsule 1 capsule as needed Orally every 6 hrs   albuterol 108 (90 Base) MCG/ACT inhaler Commonly known as: VENTOLIN HFA Inhale 2 puffs into the lungs every 6 (six) hours as needed for wheezing or shortness of breath.   Elderberry 575 MG/5ML Syrp Take 30 mLs by mouth daily.   fluticasone 50 MCG/ACT nasal  spray Commonly known as: FLONASE Place 2 sprays into both nostrils daily.   hydroxychloroquine 200 MG tablet Commonly known as: PLAQUENIL Take 1 tablet (200 mg total) by mouth 2 (two) times daily.   hydrOXYzine 25 MG tablet Commonly known as: ATARAX Take 1 tablet (25 mg total) by mouth at bedtime as needed.   lansoprazole 30 MG capsule Commonly known as: PREVACID Take 1 capsule (30 mg total) by mouth daily.   levocetirizine 5 MG tablet Commonly known as: XYZAL Take 1 tablet (5 mg total) by mouth every evening.   Motegrity 2 MG Tabs Generic drug: Prucalopride Succinate Take 1 tablet (2 mg total) by mouth daily.   Motegrity 1 MG Tabs Generic drug: Prucalopride Succinate Take 1 tablet by mouth daily.   Motegrity 2 MG Tabs Generic drug: Prucalopride Succinate Take 1 tablet (2 mg total) by mouth daily.   ondansetron 4 MG disintegrating tablet Commonly known as: ZOFRAN-ODT Dissolve 1 tablet (4 mg total) in mouth every 8 (eight) hours as needed for nausea   polyethylene glycol 17 g packet Commonly known as: MIRALAX / GLYCOLAX Take 17 g by mouth every other day.   Restora RX 60-1.25 MG Caps Generic drug: Lactobacillus Casei-Folic Acid Take 1 capsule by mouth every morning.   rizatriptan 5  MG tablet Commonly known as: MAXALT Take 1 tablet (5 mg total) by mouth as needed for migraine. May repeat in 2 hours if needed, no more than 2 pills/24 hours, no more than 3 pills/week.   tacrolimus 0.1 % ointment Commonly known as: PROTOPIC Apply a thin layer to affected areas twice daily to begin, and then decrease to daily for maintenance.   UNABLE TO FIND Med Name: NIFEDIPINE 0.3%/LIDO1.5% OINT   Xiidra 5 % Soln Generic drug: Lifitegrast Place 1 drop into both eyes 2 (two) times daily.        Allergies:  Allergies  Allergen Reactions   Molds & Smuts Other (See Comments) and Cough    sneezing    Past Medical History, Surgical history, Social history, and Family  History were reviewed and updated.  Review of Systems: Review of Systems  Constitutional:  Positive for malaise/fatigue.  HENT: Negative.    Eyes: Negative.   Respiratory: Negative.    Cardiovascular: Negative.   Gastrointestinal:  Positive for abdominal pain, heartburn and nausea.  Genitourinary: Negative.   Musculoskeletal: Negative.   Skin: Negative.   Neurological: Negative.   Endo/Heme/Allergies: Negative.   Psychiatric/Behavioral: Negative.       Physical Exam:  height is 5\' 7"  (1.702 m) and weight is 202 lb 1.9 oz (91.7 kg). Her oral temperature is 98.2 F (36.8 C). Her blood pressure is 120/76 and her pulse is 68. Her respiration is 18 and oxygen saturation is 100%.   Wt Readings from Last 3 Encounters:  12/29/22 202 lb 1.9 oz (91.7 kg)  12/23/22 199 lb (90.3 kg)  12/22/22 195 lb (88.5 kg)    Physical Exam Abdominal:     Comments: Abdominal exam is soft.  She has some tenderness throughout the abdomen to palpation.  She has decreased bowel sounds.  She has no obvious fluid wave.  There is no palpable liver or spleen tip.     Lab Results  Component Value Date   WBC 4.5 12/29/2022   HGB 12.1 12/29/2022   HCT 36.9 12/29/2022   MCV 98.9 12/29/2022   PLT 182 12/29/2022   Lab Results  Component Value Date   FERRITIN 300 12/09/2022   IRON 60 12/09/2022   TIBC 297 12/09/2022   UIBC 237 12/09/2022   IRONPCTSAT 20 12/09/2022   Lab Results  Component Value Date   RBC 3.73 (L) 12/29/2022   No results found for: "KPAFRELGTCHN", "LAMBDASER", "[facility]" Lab Results  Component Value Date   IGGSERUM 1,438 12/08/2022   IGMSERUM 167 12/08/2022   No results found for: "TOTALPROTELP", "ALBUMINELP", "A1GS", "A2GS", "BETS", "BETA2SER", "GAMS", "MSPIKE", "SPEI"   Chemistry      Component Value Date/Time   NA 139 12/29/2022 1244   NA 140 03/21/2019 1158   K 4.0 12/29/2022 1244   CL 105 12/29/2022 1244   CO2 26 12/29/2022 1244   BUN 14 12/29/2022 1244   BUN 9  03/21/2019 1158   CREATININE 0.72 12/29/2022 1244      Component Value Date/Time   CALCIUM 9.4 12/29/2022 1244   ALKPHOS 120 12/29/2022 1244   AST 13 (L) 12/29/2022 1244   ALT 12 12/29/2022 1244   BILITOT 0.5 12/29/2022 1244       Impression and Plan: Karen [name] is very pleasant 52 yo African American female with hemochromatosis, heterozygous for the H63D mutation.   I just feel bad that she has these issues that she is trying to manage.  I know that she is seeing is  some great physicians to try to help her out.  We will see what her iron studies look like.  Again, I cannot imagine that this is going to be a problem.  We will plan to get her back in 6 more months.   Josph Macho, MD 12/26/20241:30 PM

## 2022-12-30 ENCOUNTER — Other Ambulatory Visit: Payer: Commercial Managed Care - PPO

## 2023-01-02 ENCOUNTER — Other Ambulatory Visit (HOSPITAL_COMMUNITY): Payer: Self-pay

## 2023-01-02 ENCOUNTER — Ambulatory Visit: Payer: Commercial Managed Care - PPO

## 2023-01-03 ENCOUNTER — Ambulatory Visit: Payer: Commercial Managed Care - PPO

## 2023-01-03 DIAGNOSIS — K5900 Constipation, unspecified: Secondary | ICD-10-CM | POA: Diagnosis not present

## 2023-01-03 DIAGNOSIS — R279 Unspecified lack of coordination: Secondary | ICD-10-CM

## 2023-01-03 DIAGNOSIS — R102 Pelvic and perineal pain: Secondary | ICD-10-CM

## 2023-01-03 DIAGNOSIS — M6281 Muscle weakness (generalized): Secondary | ICD-10-CM

## 2023-01-03 DIAGNOSIS — M5459 Other low back pain: Secondary | ICD-10-CM | POA: Diagnosis not present

## 2023-01-03 DIAGNOSIS — M62838 Other muscle spasm: Secondary | ICD-10-CM

## 2023-01-03 NOTE — Therapy (Signed)
 OUTPATIENT PHYSICAL THERAPY FEMALE PELVIC TREATMENT   Patient Name: EVAMAE ROWEN MRN: [id number] DOB:13-Aug-1970, 52 y.o., female Today's Date: 01/03/2023  END OF SESSION:  PT End of Session - 01/03/23 1359     Visit Number 24    Date for PT Re-Evaluation 04/26/23    Authorization Type [name]    PT Start Time 1400    PT Stop Time 1440    PT Time Calculation (min) 40 min    Activity Tolerance Patient tolerated treatment well    Behavior During Therapy [facility] for tasks assessed/performed                     Past Medical History:  Diagnosis Date   Adenomyosis    Anemia    Arthritis 2013   L knee gets steroid injections    Asthmatic bronchitis 01/31/2017   Back pain    Chronic constipation    Diarrhea    DVT of lower extremity (deep venous thrombosis) (HCC)    Dysmenorrhea    Endometriosis    Esophageal stricture    GERD (gastroesophageal reflux disease)    History of kidney stones    History of uterine fibroid    IBS (irritable bowel syndrome)    Interstitial cystitis    Lactose intolerance    Migraine    with aura   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   PONV (postoperative nausea and vomiting)    Recurrent upper respiratory infection (URI)    Sebaceous cyst of breast, right lower inner quadrant 10/25/2012   Excised 11/21/12    Sleep apnea    Syncope, non cardiac    Past Surgical History:  Procedure Laterality Date   ARTHROSCOPIC HAGLUNDS REPAIR     BREAST CYST EXCISION Right 11/21/2012   Procedure: CYST EXCISION BREAST;  Surgeon: Sherlean JINNY Laughter, MD;  Location: [facility];  Service: General;  Laterality: Right;   BREAST CYST EXCISION Bilateral 01/18/2022   Procedure: EXCISION OF SEBACEOUS CYST BILATERAL BREAST;  Surgeon: Vanderbilt Ned, MD;  Location: [facility];  Service: General;  Laterality: Bilateral;   BREAST EXCISIONAL BIOPSY Right 11/2012   CESAREAN SECTION  04/19/1993   CHOLECYSTECTOMY      COLONOSCOPY  05/02/2011   Procedure: COLONOSCOPY;  Surgeon: Lynwood LITTIE Celestia Mickey., MD;  Location: [facility];  Service: Endoscopy;  Laterality: N/A;   colonoscopy  11/04/2014   DENTAL SURGERY  03/06/2018   2 surgeries ( 03/06/2018 and 04/06/2018)   to remove two separate benign tumors   ESOPHAGEAL MANOMETRY N/A 11/04/2015   Procedure: ESOPHAGEAL MANOMETRY (EM);  Surgeon: Lynwood Celestia, MD;  Location: [facility];  Service: Endoscopy;  Laterality: N/A;   ESOPHAGOGASTRODUODENOSCOPY  05/02/2011   Procedure: ESOPHAGOGASTRODUODENOSCOPY (EGD);  Surgeon: Lynwood LITTIE Celestia Mickey., MD;  Location: [facility];  Service: Endoscopy;  Laterality: N/A;   ESOPHAGOGASTRODUODENOSCOPY N/A 09/01/2014   Procedure: ESOPHAGOGASTRODUODENOSCOPY (EGD);  Surgeon: Lynwood Celestia, MD;  Location: [facility];  Service: Endoscopy;  Laterality: N/A;   KNEE SURGERY     LAPAROSCOPY     x 4   LESION REMOVAL N/A 01/18/2022   Procedure: EXCISION OF SEBACEOUS CYSTS CHEST AND NECK;  Surgeon: Vanderbilt Ned, MD;  Location: [facility];  Service: General;  Laterality: N/A;   PH IMPEDANCE STUDY N/A 11/04/2015   Procedure: PH IMPEDANCE STUDY;  Surgeon: Lynwood Celestia, MD;  Location: [facility];  Service: Endoscopy;  Laterality: N/A;   VAGINAL HYSTERECTOMY  2010   TVH--ovaries remain  Patient Active Problem List   Diagnosis Date Noted   Sjogren syndrome with keratoconjunctivitis (HCC) 12/09/2022   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   Elevated LFTs 04/04/2021   Colitis 04/04/2021   Bacterial overgrowth syndrome 05/21/2020   Obesity 05/21/2020   History of endometriosis 05/21/2020   Constipation due to outlet dysfunction 02/05/2020   Gastroesophageal reflux disease 02/05/2020   Obstructive sleep apnea treated with continuous positive airway pressure (CPAP) 06/16/2017   Perennial allergic rhinitis with a nonallergic component 01/31/2017   Asthmatic bronchitis 01/31/2017   GI symptoms 01/31/2017   Family history of  colon cancer 01/27/2015   Chest pain 12/13/2012   Dyspnea 12/13/2012   DVT of lower extremity (deep venous thrombosis) (HCC) 01/03/1990    PCP: Elliot Charm, MD  REFERRING PROVIDER: Cleotilde Ronal RAMAN, MD   REFERRING DIAG: M62.89 (ICD-10-CM) - Pelvic floor dysfunction  THERAPY DIAG:  Pelvic pain  Other low back pain  Muscle weakness (generalized)  Other muscle spasm  Unspecified lack of coordination  Rationale for Evaluation and Treatment: Rehabilitation  ONSET DATE: chronic  SUBJECTIVE:                                                                                                                                                                                           SUBJECTIVE STATEMENT:  Pt states that she had x-ray this morning due to feeling like she is still constipated.Rheumatology has run tests and determined that she has some sort of underlying autoimmune disease, but they have not been able to determine what it is yet. 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  PAIN:  Are you having pain? Yes NPRS scale: 3/10 Pain location:  Lower abdominal pain, Lt sided pain, low back pain  Pain type: turning, knotted, pressure Pain description: intermittent and constant   Aggravating factors: constipation or frequent bowel movements/constantly having bowel movements  Relieving factors: exercises (bowel massage, happy baby, spinal twists, walking)  PRECAUTIONS: None  WEIGHT BEARING RESTRICTIONS: No  FALLS:  Has patient fallen in last 6 months? No  LIVING ENVIRONMENT: Lives with: lives with their spouse Lives in: House/apartment  OCCUPATION: nurse, in admin  PLOF: Independent  PATIENT GOALS: decrease pain and have more normal bowel movements  PERTINENT HISTORY:  Vaginal hysterectomy, DVT LE, endometriosis, interstitial cystitis, exploratory laps for  endometriosis, c-section, cholecystectomy Sexual abuse: No  BOWEL MOVEMENT: Pain with bowel movement: Yes Type of bowel movement:Type (Bristol Stool Scale) 1-7 (sometimes full range in the same bowel movement), Frequency sometimes many times a day, sometimes up to 4 days  in between, and Strain Yes Fully empty rectum: No Leakage: No Pads: No Fiber supplement: No - has attempted metamucil and it made constipation worse  URINATION: Pain with urination: Yes Fully empty bladder: No Stream:  varies  Urgency: Yes: all the time Frequency: multiple times an hours  Leakage: Urge to void, Walking to the bathroom, Coughing, Sneezing, Laughing, and Exercise Pads: Yes: daily  INTERCOURSE: Pain with intercourse: Initial Penetration and During Penetration Ability to have vaginal penetration:  Yes: with pain Climax: non painful WNL   PREGNANCY: Vaginal deliveries 1 Tearing Yes: 3rd degree tear C-section deliveries 1 Currently pregnant No  PROLAPSE: Periodically will feel vaginal bulging, heaviness in lower abdomen   OBJECTIVE:  11/09/22: No internal rectal or vaginal pelvic floor exam performed due to high level of skin irritation   Weak transversus abdominus contraction  Abdominal restriction and tenderness in bil lower quadrant  Unable to perform pelvic tilt with appropriate coordination  LUMBARAROM/PROM:  A/PROM A/PROM  Eval (% available) 11/09/22 (% available)  Flexion 50 90  Extension 25, pain anteriorly  25, pain in low back  Right lateral flexion 50, pain anteriorly  75, pain on right  Left lateral flexion 50, pain anteriorly  75, pain on right   (Blank rows = not tested) 07/21/22: standing prolapse assessment demonstrated grade 2 anterior vaginal wall laxity   06/08/22:               External Perineal Exam: WNL                              Internal Pelvic Floor: burning reported with palpation of superficial muscles; deep aching/pressure with palpation of Lt levator ani    Patient confirms identification and approves PT to assess internal pelvic floor and treatment Yes  PELVIC MMT:   MMT eval  Vaginal 3/5  Internal Anal Sphincter 1/5  External Anal Sphincter 2/5  Puborectalis 1/5  Diastasis Recti   (Blank rows = not tested)        TONE: High in Lt levator ani   PROLAPSE: Not able to tell this session due to dyssynergic pelvic floor contraction     06/01/22: COGNITION: Overall cognitive status: Within functional limits for tasks assessed     SENSATION: Light touch: Appears intact Proprioception: Appears intact  GAIT: Comments: decreased hip extension, forward flexed trunk  POSTURE: rounded shoulders, forward head, decreased lumbar lordosis, increased thoracic kyphosis, posterior pelvic tilt, and flexed trunk    LUMBARAROM/PROM:  A/PROM A/PROM  Eval (% available)  Flexion 50  Extension 25, pain anteriorly   Right lateral flexion 50, pain anteriorly   Left lateral flexion 50, pain anteriorly    (Blank rows = not tested)   PALPATION:   General  Significant abdominal restriction and tenderness; decreased rib mobility with mobilization and breathing    TODAY'S TREATMENT:  DATE:  01/03/23 Manual: Abdominal mobilization/bowel mobilization  12/15/22 Manual: Trigger Point Dry-Needling  Treatment instructions: Expect mild to moderate muscle soreness. S/S of pneumothorax if dry needled over a lung field, and to seek immediate medical attention should they occur. Patient verbalized understanding of these instructions and education.  Patient Consent Given: Yes Education handout provided: Previously provided Muscles treated: L1-L5 lumbar multifidi Electrical stimulation performed: Yes Parameters:  30Hz  and intensity to tolerance  Treatment response/outcome: good improvement in tissue restriction Soft tissue  mobilization to lumbar paraspinals   11/23/22 Exercises: Supine piriformis stretch 2 min bil Single knee to chest 10x bil Wide leg lower trunk rotation 3 x 10 Supine pelvic tilts 12x Seated thoracic extensions on foam roller Therapeutic activities: Urge drill Voiding schedule/bladder training Double voiding  PATIENT EDUCATION:  Education details: See above Person educated: Patient Education method: Explanation, Demonstration, Tactile cues, Verbal cues, and Handouts Education comprehension: verbalized understanding  HOME EXERCISE PROGRAM: [id number]  ASSESSMENT:  CLINICAL IMPRESSION: Treatment limited this session due to extensive subjective after multiple doctor's appointments over the last several weeks. She has been diagnosed with underlying autoimmune disease and she is waiting to begin medication for this as she is also working with allergist on immune system. She is constipated again and supposed to do a cleanse if her x-ray shows stool burden. Do to this and abdominal discomfort, we spent the treatment time we had today working on bowel mobilization which she tolerated well. She will continue to benefit from skilled PT intervention in order to decrease pain, improve bowel movements, and decrease urinary urgency/incontinence.     OBJECTIVE IMPAIRMENTS: decreased activity tolerance, decreased coordination, decreased endurance, decreased mobility, decreased strength, increased fascial restrictions, increased muscle spasms, impaired tone, postural dysfunction, and pain.   ACTIVITY LIMITATIONS: lifting, bending, continence, and locomotion level  PARTICIPATION LIMITATIONS: interpersonal relationship, community activity, and occupation  PERSONAL FACTORS: 1 comorbidity: medical history  are also affecting patient's functional outcome.   REHAB POTENTIAL: Good  CLINICAL DECISION MAKING: Stable/uncomplicated  EVALUATION COMPLEXITY: Low   GOALS: Goals reviewed with patient?  Yes  SHORT TERM GOALS: Target date: 07/06/22 - updated 07/12/22 - updated 08/18/22 - updated 09/22/22 - updated 10/26/22 - updated 11/09/22 - updated 12/15/22  Pt will be independent with HEP.   Baseline: Goal status: MET 07/12/22  2.  Pt will be independent with diaphragmatic breathing and down training activities in order to improve pelvic floor relaxation.  Baseline:  Goal status: MET 07/12/22  3.  Pt will be independent with the knack, urge suppression technique, and double voiding in order to improve bladder habits and decrease urinary incontinence.   Baseline:  Goal status: MET 09/22/22  4.  Pt will be independent with use of squatty potty, relaxed toileting mechanics, and improved bowel movement techniques in order to increase ease of bowel movements and complete evacuation.   Baseline:  Goal status: MET 11/09/22  5.  Pt will be able to correctly perform diaphragmatic breathing and appropriate pressure management in order to prevent worsening vaginal wall laxity and improve pelvic floor A/ROM.   Baseline:  Goal status: IN PROGRESS 12/15/22  LONG TERM GOALS: Target date: 11/16/2022 - updated 07/12/22 - updated 08/18/22 - updated 09/22/22 - updated 10/26/22 - updated 11/09/22 - updated 12/15/22  Pt will be independent with advanced HEP.   Baseline:  Goal status: IN PROGRESS 12/15/22  2.  Pt will demonstrate normal pelvic floor muscle tone and A/ROM, able to achieve 4/5 strength with contractions and 10 sec endurance,  in order to provide appropriate lumbopelvic support in functional activities.   Baseline:  Goal status: IN PROGRESS 12/15/22  3.  Pt will increase all impaired lumbar A/ROM by 25% without pain.  Baseline:  Goal status:  IN PROGRESS 12/15/22  4.  Pt will report pain no higher than 3/10 with any activity. Baseline:  Goal status:  IN PROGRESS 12/15/22  5.  Pt will report 0/10 pain with vaginal penetration in order to improve intimate relationship with partner.     Baseline:  Goal status:  IN PROGRESS 12/15/22  6.  Pt will be able to go 2-3 hours in between voids without urgency or incontinence in order to improve QOL and perform all functional activities with less difficulty.   Baseline: urgency improving Goal status: IN PROGRESS 12/15/22  7.  Pt will report no leaks with laughing, coughing, sneezing in order to improve comfort with interpersonal relationships and community activities.   Baseline:  Goal status:  IN PROGRESS 12/15/22  8.  Pt will have consistent bowel movements 4-5x/week without straining or pain.  Baseline:  Goal status:  IN PROGRESS 12/15/22  PLAN:  PT FREQUENCY: 1-2x/week  PT DURATION: 6 months  PLANNED INTERVENTIONS: Therapeutic exercises, Therapeutic activity, Neuromuscular re-education, Balance training, Gait training, Patient/Family education, Self Care, Joint mobilization, Dry Needling, Biofeedback, and Manual therapy  PLAN FOR NEXT SESSION: Continue to work on motor control activities surrounding pelvis; progress mobility; manual techniques as needed for comfort and to increase circulation.   Josette Mares, PT, DPT12/31/242:50 PM

## 2023-01-05 ENCOUNTER — Ambulatory Visit: Payer: Commercial Managed Care - PPO | Admitting: Allergy & Immunology

## 2023-01-05 ENCOUNTER — Telehealth: Payer: Commercial Managed Care - PPO | Admitting: Allergy & Immunology

## 2023-01-06 ENCOUNTER — Telehealth: Payer: Commercial Managed Care - PPO | Admitting: Allergy & Immunology

## 2023-01-06 ENCOUNTER — Other Ambulatory Visit: Payer: Self-pay

## 2023-01-06 ENCOUNTER — Inpatient Hospital Stay: Payer: Commercial Managed Care - PPO | Attending: Hematology & Oncology

## 2023-01-06 DIAGNOSIS — R0602 Shortness of breath: Secondary | ICD-10-CM | POA: Diagnosis not present

## 2023-01-06 DIAGNOSIS — R1310 Dysphagia, unspecified: Secondary | ICD-10-CM

## 2023-01-06 DIAGNOSIS — R198 Other specified symptoms and signs involving the digestive system and abdomen: Secondary | ICD-10-CM | POA: Diagnosis not present

## 2023-01-06 DIAGNOSIS — K219 Gastro-esophageal reflux disease without esophagitis: Secondary | ICD-10-CM | POA: Diagnosis not present

## 2023-01-06 DIAGNOSIS — R682 Dry mouth, unspecified: Secondary | ICD-10-CM | POA: Diagnosis not present

## 2023-01-06 DIAGNOSIS — R197 Diarrhea, unspecified: Secondary | ICD-10-CM

## 2023-01-06 DIAGNOSIS — B999 Unspecified infectious disease: Secondary | ICD-10-CM

## 2023-01-06 DIAGNOSIS — M6289 Other specified disorders of muscle: Secondary | ICD-10-CM | POA: Diagnosis not present

## 2023-01-06 NOTE — Progress Notes (Signed)
 RE: NESIAH [name] MRN: [id number] DOB: 05-24-1970 Date of Telemedicine Visit: 01/06/2023  Referring provider: Elliot Charm,* Primary care provider: Elliot Charm, MD  Chief Complaint: Other (Recently had test done from another Doctor and has concerns about her care)   Telemedicine Follow Up Visit via Telephone: I connected with [name] for a follow up on 01/10/23 by telephone and verified that I am speaking with the correct person using two identifiers.   I discussed the limitations, risks, security and privacy concerns of performing an evaluation and management service by telephone and the availability of in person appointments. I also discussed with the patient that there may be a patient responsible charge related to this service. The patient expressed understanding and agreed to proceed.  Patient is at home.   Provider is at the office.  Visit start time: 11:46 AM Visit end time: 12:10 PM Insurance consent/check in by: Nat Medical consent and medical assistant/nurse: Harlene  History of Present Illness:  She is a 53 y.o. female, who is being followed for multiple complaints, although many of them are not atopic in nature. Her previous allergy office visit was in December 2024 with myself.  She was last seen in December 2024.  At that time, she was doing very well with Aquaphor and Kenalog  for her vulvar dermatitis.  She continue to follow with her dermatologist.  For her SIBO, she continued to follow with her gastroenterologist.  She also remains on Prevacid  for her eosinophilic esophagitis.  For her rheumatoid arthritis and hemochromatosis, she was planning to start Plaquenil .  For her constipation and diarrhea, she continue to follow with GI.  For her intermittent asthma, her lung testing looks stable.  We continue with albuterol  as needed.  She had new onset lymphedema which was being worked up.  For her recurrent infections, we obtained immune workup.   Her immune workup largely looked good, but she was only protective to 7 out of 23 strains of streptococcal pneumonia.  We recommended that she get a Pneumovax which she did obtain in our office. This was administered on December 20.  Since last visit, she has an eventful month.   She recently unsuccessful phlebotomy attempt due to dehydration.  She was told that she needed to drink more fluids and try again.  They were only able to drain 100 cc of blood, although their goal was 500 cc.  She reported feeling dizzy and nauseous post-procedure. She experienced diarrhea the day prior to the phlebotomy, which she attributed to the disease. The patient expressed uncertainty about whether the hemochromatosis or a gastrointestinal issue was the primary driver of her current symptoms. She does have a known history of gastrointestinal issues, including constipation and overflow diarrhea. Despite a cleanse two weeks prior, the patient reported being constipated again. She had two recent x-rays, which showed a progression from a mild to a moderate stool burden. She has a family history of colon cancer and underwent genetic testing four years ago, which did not reveal any significant genetic risk factors.   [name] also reported having three cysts on her right hand and perineal irritation, which her rheumatologist noted are common in her patients. However, the patient expressed interest in reevaluating this in light of her hemochromatosis diagnosis.  We have never done genetic testing on our part.  She received a Pneumovax vaccine on the 20th and is scheduled for a titer check after January 20th.  She would also like genetic testing performed to look for inborn  errors of immunity.  Asthma/Respiratory Symptom History: Breathing is under good control with albuterol  as needed.  She has not been on prednisone  since we saw her.  Allergic Rhinitis Symptom History: She remains on Flonase  1 spray per nostril  daily.  Otherwise, there have been no changes to her past medical history, surgical history, family history, or social history.  Assessment and Plan:  Avory is a 53 y.o. female with:  SOB (shortness of breath) - largely resolved   3mm RML lung nodule (benign per imaging guidelines)   Diarrhea - with a history of SIBO s/p treatment by Dr. 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  We are also going to collect testing for a panel for inborn errors of immunity, to be sent to [facility].  Hopefully this will answer some questions.  From an atopic perspective, she seems to be under good control.  Diagnostics: None.  Medication List:  Current Outpatient Medications  Medication Sig Dispense Refill   Acetaminophen  500 MG capsule 1 capsule as needed Orally every 6 hrs     albuterol  (VENTOLIN  HFA) 108 (90 Base) MCG/ACT inhaler Inhale 2 puffs into the lungs every 6 (six) hours as needed for wheezing or shortness of breath. 6.7 g 2   Elderberry 575 MG/5ML SYRP Take 30 mLs by mouth daily.     fluticasone  (FLONASE ) 50 MCG/ACT nasal spray Place 2 sprays into both nostrils daily. 16 g 5   hydroxychloroquine  (PLAQUENIL ) 200 MG tablet Take 1 tablet (200 mg total) by mouth 2 (two) times daily. (Patient not taking: Reported on 12/29/2022) 60 tablet 3   hydrOXYzine  (ATARAX ) 25 MG tablet Take 1 tablet (25 mg total) by mouth at bedtime as needed. 30 tablet 1   Lactobacillus Casei-Folic Acid  (RESTORA RX) 60-1.25 MG CAPS Take 1 capsule by mouth every morning. 30 capsule 11   lansoprazole  (PREVACID ) 30 MG capsule Take 1 capsule (30 mg  total) by mouth daily. 90 capsule 3   levocetirizine (XYZAL ) 5 MG tablet Take 1 tablet (5 mg total) by mouth every evening. 90 tablet 1   Lifitegrast  (XIIDRA ) 5 % SOLN Place 1 drop into both eyes 2 (two) times daily. 180 each 3   ondansetron  (ZOFRAN -ODT) 4 MG disintegrating tablet Dissolve 1 tablet (4 mg total) in mouth every 8 (eight) hours as needed for nausea 30 tablet 2   polyethylene glycol (MIRALAX  / GLYCOLAX ) 17 g packet Take 17 g by mouth every other day.     Prucalopride Succinate  (MOTEGRITY ) 1 MG TABS Take 1 tablet by mouth daily. (Patient not taking: Reported on 11/10/2022) 90 tablet 1   Prucalopride Succinate  (MOTEGRITY ) 2 MG TABS Take 1 tablet (2 mg total) by mouth daily. (Patient not taking: Reported on 12/08/2022) 90 tablet 2   Prucalopride Succinate  2 MG TABS Take 1 tablet (2 mg total) by mouth daily. (Patient not taking: Reported on 11/10/2022) 90 tablet 1   rizatriptan  (MAXALT ) 5 MG tablet Take 1 tablet (5 mg total) by mouth as needed for migraine. May repeat in 2 hours if needed, no more than 2 pills/24 hours,  no more than 3 pills/week. (Patient not taking: Reported on 12/29/2022) 10 tablet 0   tacrolimus  (PROTOPIC ) 0.1 % ointment Apply a thin layer to affected areas twice daily to begin, and then decrease to daily for maintenance. (Patient not taking: Reported on 12/29/2022) 30 g 3   UNABLE TO FIND Med Name: NIFEDIPINE 0.3%/LIDO1.5% OINT     No current facility-administered medications for this visit.   Allergies: Allergies  Allergen Reactions   Molds & Smuts Other (See Comments) and Cough    sneezing   I reviewed her past medical history, social history, family history, and environmental history and no significant changes have been reported from previous visits.  Review of Systems  Constitutional:  Positive for fatigue. Negative for fever.  HENT:  Positive for congestion. Negative for ear discharge and ear pain.   Eyes:  Negative for pain, discharge, redness and itching.   Respiratory:  Negative for cough, shortness of breath and wheezing.   Cardiovascular: Negative.  Negative for chest pain and palpitations.  Gastrointestinal:  Positive for abdominal pain, constipation and nausea.  Endocrine: Negative for cold intolerance and heat intolerance.  Skin: Negative.  Negative for rash.  Allergic/Immunologic: Positive for environmental allergies. Negative for food allergies.  Neurological:  Negative for dizziness and headaches.  Hematological:  Does not bruise/bleed easily.    Objective:  Physical exam not obtained as encounter was done via telephone.   Previous notes and tests were reviewed.  I discussed the assessment and treatment plan with the patient. The patient was provided an opportunity to ask questions and all were answered. The patient agreed with the plan and demonstrated an understanding of the instructions.   The patient was advised to call back or seek an in-person evaluation if the symptoms worsen or if the condition fails to improve as anticipated.  I provided 24 minutes of non-face-to-face time during this encounter.  It was my pleasure to participate in Denaly Decelles's care today. Please feel free to contact me with any questions or concerns.   Sincerely,  Marty Morton Shaggy, MD

## 2023-01-06 NOTE — Patient Instructions (Signed)

## 2023-01-06 NOTE — Progress Notes (Signed)
 Karen [name] presents today for first time phlebotomy per MD orders Phlebotomy procedure started at 09:00 and ended at 09:31. 91 grams removed. Patient observed for 30 minutes after procedure. Patient tolerated procedure well. 18 G IV needle removed from the left AC intact. After procedure, patient went to the restroom. When she got back to her chair, she complained of some nausea and dizziness. I made her sit down and elevated her feet. I gave her more drinks and snacks. Patient stayed until she felt better. She left with her spouse.  Patient was stuck six times for an IV site.

## 2023-01-09 ENCOUNTER — Inpatient Hospital Stay: Payer: Commercial Managed Care - PPO

## 2023-01-09 ENCOUNTER — Ambulatory Visit: Payer: Commercial Managed Care - PPO | Attending: Surgery | Admitting: Occupational Therapy

## 2023-01-09 DIAGNOSIS — I890 Lymphedema, not elsewhere classified: Secondary | ICD-10-CM | POA: Insufficient documentation

## 2023-01-09 NOTE — Therapy (Signed)
 OUTPATIENT OCCUPATIONAL THERAPY TREATMENT NOTE  LOWER EXTREMITY LYMPHEDEMA  Patient Name: Karen [name] MRN: [id number] DOB:12/26/1970, 53 y.o., female Today's Date: 01/09/2023  END OF SESSION:   OT End of Session - 01/09/23 0922     Visit Number 12    Number of Visits 36    Date for OT Re-Evaluation 02/08/23    OT Start Time 0918    OT Stop Time 1018    OT Time Calculation (min) 60 min    Activity Tolerance Patient tolerated treatment well;No increased pain    Behavior During Therapy [facility] for tasks assessed/performed               Past Medical History:  Diagnosis Date   Adenomyosis    Anemia    Arthritis 2013   L knee gets steroid injections    Asthmatic bronchitis 01/31/2017   Back pain    Chronic constipation    Diarrhea    DVT of lower extremity (deep venous thrombosis) (HCC)    Dysmenorrhea    Endometriosis    Esophageal stricture    GERD (gastroesophageal reflux disease)    History of kidney stones    History of uterine fibroid    IBS (irritable bowel syndrome)    Interstitial cystitis    Lactose intolerance    Migraine    with aura   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   PONV (postoperative nausea and vomiting)    Recurrent upper respiratory infection (URI)    Sebaceous cyst of breast, right lower inner quadrant 10/25/2012   Excised 11/21/12    Sleep apnea    Syncope, non cardiac    Past Surgical History:  Procedure Laterality Date   ARTHROSCOPIC HAGLUNDS REPAIR     BREAST CYST EXCISION Right 11/21/2012   Procedure: CYST EXCISION BREAST;  Surgeon: Sherlean JINNY Laughter, MD;  Location: [facility];  Service: General;  Laterality: Right;   BREAST CYST EXCISION Bilateral 01/18/2022   Procedure: EXCISION OF SEBACEOUS CYST BILATERAL BREAST;  Surgeon: Vanderbilt Ned, MD;  Location: [facility];  Service: General;  Laterality: Bilateral;   BREAST EXCISIONAL BIOPSY Right 11/2012   CESAREAN SECTION  04/19/1993   CHOLECYSTECTOMY      COLONOSCOPY  05/02/2011   Procedure: COLONOSCOPY;  Surgeon: Lynwood LITTIE Celestia Mickey., MD;  Location: [facility];  Service: Endoscopy;  Laterality: N/A;   colonoscopy  11/04/2014   DENTAL SURGERY  03/06/2018   2 surgeries ( 03/06/2018 and 04/06/2018)   to remove two separate benign tumors   ESOPHAGEAL MANOMETRY N/A 11/04/2015   Procedure: ESOPHAGEAL MANOMETRY (EM);  Surgeon: Lynwood Celestia, MD;  Location: [facility];  Service: Endoscopy;  Laterality: N/A;   ESOPHAGOGASTRODUODENOSCOPY  05/02/2011   Procedure: ESOPHAGOGASTRODUODENOSCOPY (EGD);  Surgeon: Lynwood LITTIE Celestia Mickey., MD;  Location: [facility];  Service: Endoscopy;  Laterality: N/A;   ESOPHAGOGASTRODUODENOSCOPY N/A 09/01/2014   Procedure: ESOPHAGOGASTRODUODENOSCOPY (EGD);  Surgeon: Lynwood Celestia, MD;  Location: [facility];  Service: Endoscopy;  Laterality: N/A;   KNEE SURGERY     LAPAROSCOPY     x 4   LESION REMOVAL N/A 01/18/2022   Procedure: EXCISION OF SEBACEOUS CYSTS CHEST AND NECK;  Surgeon: Vanderbilt Ned, MD;  Location: [facility];  Service: General;  Laterality: N/A;   PH IMPEDANCE STUDY N/A 11/04/2015   Procedure: PH IMPEDANCE STUDY;  Surgeon: Lynwood Celestia, MD;  Location: [facility];  Service: Endoscopy;  Laterality: N/A;   VAGINAL HYSTERECTOMY  2010   TVH--ovaries remain   Patient  Active Problem List   Diagnosis Date Noted   Sjogren syndrome with keratoconjunctivitis (HCC) 12/09/2022   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   Elevated LFTs 04/04/2021   Colitis 04/04/2021   Bacterial overgrowth syndrome 05/21/2020   Obesity 05/21/2020   History of endometriosis 05/21/2020   Constipation due to outlet dysfunction 02/05/2020   Gastroesophageal reflux disease 02/05/2020   Obstructive sleep apnea treated with continuous positive airway pressure (CPAP) 06/16/2017   Perennial allergic rhinitis with a nonallergic component 01/31/2017   Asthmatic bronchitis 01/31/2017   GI symptoms 01/31/2017   Family history of  colon cancer 01/27/2015   Chest pain 12/13/2012   Dyspnea 12/13/2012   DVT of lower extremity (deep venous thrombosis) (HCC) 01/03/1990    PCP: Gaile New, MD  REFERRING PROVIDER: Valery Ripple, MD  REFERRING DIAG: I89.0  THERAPY DIAG:  Lymphedema, not elsewhere classified  Rationale for Evaluation and Treatment: Rehabilitation  ONSET DATE: ~2014; exacerbation in 2023  SUBJECTIVE:                                                                                                                                                                                           SUBJECTIVE STATEMENT: Karen [name] returns to OT for Rx visit for BLE/BLQ lymphedema care. Pt denies lymphedema related leg pain. She reports increased feeling of heaviness in  L lower quadrant. Pt reports she was diagnosed with full-blown  hemochromatosis over the holidays and has undergone phlebotomy 1 x .   PERTINENT HISTORY: CVI, OSA (no CPAP)  PAIN:  Are you having pain? Yes: NPRS scale: not rated/10 Pain location: BLE Pain description: tight, heavy, tingling, numbness Aggravating factors: standing, walking, dependent sitting > 30 minutes Relieving factors: elevation  PRECAUTIONS: Other: LYMPHEDEMA; Hx LLE DVT  RED FLAGS: Hx LLE DVT  WEIGHT BEARING RESTRICTIONS: No  FALLS:  Has patient fallen in last 6 months? No  LIVING ENVIRONMENT: Lives with: lives with spouse and daughter Lives in: House/apartment Stairs: Yes; Internal: 14 steps; yes and External: 4 steps; yes Has following equipment at home: None  OCCUPATION: [employer] Services Operational Officer, walking, desk work  LEISURE: family time  HAND DOMINANCE: right   PRIOR LEVEL OF FUNCTION: Independent  PATIENT GOALS: Feel better, be able to be more active without pain, wear preferred street shoes  OBJECTIVE: Note: Objective measures were completed at Evaluation unless otherwise noted.  COGNITION:  Overall cognitive status: Within functional limits  for tasks assessed   OBSERVATIONS / OTHER ASSESSMENTS:   POSTURE: WFL  LE ROM: WFL  LE MMT: WFL  LYMPHEDEMA ASSESSMENTS: non-Ca related  INFECTIONS: denies cellulitis and wound hx  BLE COMPARATIVE LIMB VOLUMETRICS Initial 11/15/22  LANDMARK RIGHT (dominant)  R LEG (A-D) 3028.9 ml  R THIGH (E-G) 4809.60 ml  R FULL LIMB (A-G) 7838.5 ml  Limb Volume differential (LVD)  %  Volume change since initial %  Volume change overall V  (Blank rows = not tested)  LANDMARK LEFT  (Rx)  L LEG (A-D) 3189.9 ml  L THIGH (E-G) 4959.8 ml  L FULL LIMB (A-G) 8149.7 ml  Limb Volume differential (LVD)  LEG LVD = 5.32%, L>R; THIGH LVD = 3.12%, L>R; And full LLE LVD = 3.97%, L>R. 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 Obesity  Skin  Description Hyper-Keratosis Peau d' Orange Shiny Tight Fibrotic/ Indurated Fatty Doughy Spongy/ boggy       R>L x  x   Skin dry Flaky WNL Macerated   mildly      Color Redness Varicosities Blanching Hemosiderin Stain Mottled        x   Odor Malodorous Yeast Fungal infection  WNL      x   Temperature Warm Cool wnl     x    Pitting Edema   1+ 2+ 3+ 4+ Non-pitting         x   Girth Symmetrical Asymmetrical                   Distribution    L>R toes to groin    Stemmer Sign Positive Negative   +L -R   Lymphorrhea History Of:  Present Absent     x    Wounds History Of Present Absent Venous Arterial Pressure Sheer     x        Signs of Infection Redness Warmth Erythema Acute Swelling Drainage Borders                    Sensation Light Touch Deep pressure Hypersensitivity   In tact Impaired In tact Impaired Absent Impaired   x  x  x     Nails WNL   Fungus nail dystrophy   x     Hair Growth Symmetrical Asymmetrical   x    Skin Creases Base of toes  Ankles   Base of Fingers knees       Abdominal pannus Thigh  Lobules  Face/neck   x           GAIT: Distance walked: >500' Assistive device utilized: None Level of assistance: Complete Independence Comments: Functional ambulation and transfers [place]  LYMPHEDEMA LIFE IMPACT SCALE (LLIS): Initial 11/15/22  50%  FOTO functional outcome measure: TBA initial Rx visit 2/2 time constraints  TODAY'S TREATMENT:  MLD to BLE/BLQ, emphasis on R Pt edu re lymphatic structure and function, and LE self care home program  PATIENT EDUCATION:  Continued Pt/ CG edu for how function of cisterna chyli, part of the thoracic duct of deep abdominal lymphatics, assists with digestion by filtering many of the fats and proteins from the digestive system. Sub optimal lymphatic flow in this region may result in constipation, bloating, swelling, etc. MLD helps mobilize these protein and fats , and the rhythmic movement of manual lymphatic drainage stimulates digestive muscles and increase peristalsis. Provided printed resource for reference.  Continued lymphedema self care home program training throughout session. Topics include outcome of comparative limb volumetrics- starting limb volume differentials (LVDs), technology and gradient techniques used for short stretch, multilayer compression wrapping, simple self-MLD, therapeutic lymphatic pumping exercises, skin/nail care, LE precautions,. compression garment recommendations and specifications, wear and care schedule and compression garment donning / doffing w assistive devices. Discussed progress towards all OT goals since commencing CDT. All questions answered to the Pt's satisfaction. Good return. Person educated: Patient  Education method: Explanation, Demonstration, and Handouts Education comprehension: verbalized understanding, returned demonstration, verbal cues required, and needs further  education  HOME EXERCISE PROGRAM: BLE lymphatic pumping there ex using- 1 sets of 10 reps, each exercise in order-  1-2 x daily, bilaterally Simple self MLD 1 x daily Daily skin care to increase hydration, skin mobility and decrease infection risk- can be done during MLD Compression wraps 23/7 during intensive phase of CDT Compression garments/ devices during self-management phase of CDT  ASSESSMENT:  CLINICAL IMPRESSION:  Continued Pt/ CG edu for how function of cisterna chyli, part of the thoracic duct of deep abdominal lymphatics, assists with digestion by filtering many of the fats and proteins from the digestive system. Sub optimal lymphatic flow in this region may result in constipation, bloating, swelling, etc. MLD helps mobilize these proteins and fats , and the rhythmic movement of manual lymphatic drainage stimulates digestive muscles and increase peristalsis. Pt agrees with plan to continue MLD according to POC unless directed by her oncologist to stop.  Pt tolerated RLE/ BLQ MLD as established.  Leg swelling appears unchanged after session. Pt has not yet ordered compression knee highs due to hectic schedule  over the holidays and also  increased frequency of medical appointments. Pt is in touch w DME vendor. Cont as per POC.  11/15/22 Initial Evaluation: Karen [name] is a 53 y.o. female presenting with very mild, stage II, LLE lymphedema 2/2 venous insufficiency and obesity. L Leg swelling and associated pain swelling fluctuates. It has progressed over time, and now no longer resolves with elevation over night. RLE swelling limits balance during functional ambulation. It exacerbates infection and falls risk. LLE/RLQ lymphedema interferes with functional performance in all occupational domains, including basic and instrumental ADLs, productive activities, leisure pursuits and social participation. Pt will benefit from Occupational Therapy for Complete Decongestive Therapy (CDT) to restore  function, to reduce physical and psychologic suffering associated with chronic, progressive lymphedema and associated pain, and to limit infection. CDT will include manual lymphatic drainage (MLD), skin care, therapeutic exercise and compression wraps and garment's devices. Without skilled OT for lymphedema care the condition will worsen and further functional decline is expected  Custom-made gradient compression garments and HOS devices are medically necessary because they are uniquely sized and shaped to fit the exact dimensions of the affected extremities, and to provide appropriate medical grade, graduated compression essential for optimally managing chronic, progressive lymphedema. Multiple custom compression garments  are needed to ensure proper hygiene to limit infection risk. Custom compression garments should be replaced q 3-6 months When worn consistently for optimal lipo-lymphedema self-management over time. HOS devices, medically necessary to limit fibrosis buildup in tissue, should be replaced q 2 years and PRN when worn out.      OBJECTIVE IMPAIRMENTS: decreased activity tolerance, decreased knowledge of condition, decreased knowledge of use of DME, increased edema, impaired sensation, pain, and chronic, progressive leg swelling .   ACTIVITY LIMITATIONS: dependent sitting, extended standing and/ or walking, squatting, and lower body dressing, fitting preferred street shoes  PARTICIPATION LIMITATIONS: shopping, community activity, occupation, and yard work  PERSONAL FACTORS: 3+ comorbidities: Hx DVT,  OSA, CVI. Varicose veins  are also affecting patient's functional outcome.   REHAB POTENTIAL: Good  EVALUATION COMPLEXITY: Moderate   GOALS: Goals reviewed with patient? Yes  SHORT TERM GOALS: Target date: 4th OT Rx visit  SHORT TERM GOALS: Target date: 4th OT Rx visit   Pt will demonstrate understanding of lymphedema precautions and prevention strategies with modified independence  using a printed reference to identify at least 5 precautions and discussing how s/he may implement them into daily life to reduce risk of progression with extra time. Baseline:Max A Goal status: INITIAL   Pt will be able to apply multilayer, knee length, gradient, compression wraps to one leg at a time with modified assistance (extra time and assistive device/s) to decrease limb volume, to limit infection risk, and to limit lymphedema progression.  Baseline: Dependent Goal status: INITIAL  LONG TERM GOALS: Target date: 02/08/23  Given this patient's Intake score of TBA /100% on the functional outcomes FOTO tool, patient will experience an increase in function of 5 points to improve basic and instrumental ADLs performance, including lymphedema self-care.  Baseline: Max A Goal status: INITIAL   Given this patient's Intake score of TBA% on the Lymphedema Life Impact Scale (LLIS), patient will experience a reduction of at least 5 points in her perceived level of functional impairment resulting from lymphedema to improve functional performance and quality of life (QOL). Baseline: 64.71% Goal status: INITIAL  3 Pt will achieve at least a 10% volume reduction in B legs to return limb to typical size and shape, to limit infection risk and LE progression, to decrease pain, to improve function. Baseline: Dependent Goal status: INITIAL  4.  Pt will obtain proper compression garments/devices and achieve modified independence (extra time + assistive devices) with donning/doffing to optimize limb volume reductions and limit LE  progression over time. Baseline:  Goal status: INITIAL  5.  During Intensive phase CDT , with modified independence, Pt will achieve at least 85% compliance with all lymphedema self-care home program components, including daily skin care, compression wraps and /or garments, simple self MLD and lymphatic pumping therex to habituate LE self care protocol  into ADLs for optimal LE  self-management over time. Baseline: Dependent Goal status: INITIAL   PLAN:  OT FREQUENCY: 2x/week  OT DURATION: 12 weeks  PLANNED INTERVENTIONS: 97110-Therapeutic exercises, 97530- Therapeutic activity, 97535- Self Care, [id number]- Manual therapy, Patient/Family education, Manual lymph drainage, Compression bandaging, and fit with appropriate compression garments  PLAN FOR NEXT SESSION:  Cont MLD Cont Pt edu for LE self-care  Zebedee Dec, MS, OTR/L, CLT-LANA 01/09/23 11:08 AM '

## 2023-01-09 NOTE — Progress Notes (Signed)
 Pt here for phlebotomy today per order of Dr. Timmy for elevated iron saturation.  Unable to complete phlebotomy as ordered.  Pt stuck unsuccessfully x's 5 by 3 RN's. Pt informed that we would speak with Dr Timmy, (who has left for the day) and give her a call tomorrow with further orders from MD.  Pt has no questions at this time and is appreciative of assistance.

## 2023-01-09 NOTE — Patient Instructions (Signed)

## 2023-01-10 ENCOUNTER — Telehealth: Payer: Self-pay

## 2023-01-10 NOTE — Telephone Encounter (Signed)
 Mychart message sent.

## 2023-01-11 ENCOUNTER — Ambulatory Visit: Payer: Commercial Managed Care - PPO | Attending: Obstetrics & Gynecology

## 2023-01-11 DIAGNOSIS — M5459 Other low back pain: Secondary | ICD-10-CM | POA: Diagnosis not present

## 2023-01-11 DIAGNOSIS — R102 Pelvic and perineal pain: Secondary | ICD-10-CM | POA: Diagnosis not present

## 2023-01-11 DIAGNOSIS — M6281 Muscle weakness (generalized): Secondary | ICD-10-CM | POA: Diagnosis not present

## 2023-01-11 DIAGNOSIS — R279 Unspecified lack of coordination: Secondary | ICD-10-CM | POA: Diagnosis not present

## 2023-01-11 DIAGNOSIS — M62838 Other muscle spasm: Secondary | ICD-10-CM | POA: Diagnosis not present

## 2023-01-11 NOTE — Therapy (Signed)
 OUTPATIENT PHYSICAL THERAPY FEMALE PELVIC TREATMENT   Patient Name: Karen [name] MRN: [id number] DOB:1970/05/06, 53 y.o., female Today's Date: 01/11/2023  END OF SESSION:  PT End of Session - 01/11/23 1105     Visit Number 25    Date for PT Re-Evaluation 04/26/23    Authorization Type [name]    PT Start Time 1104    PT Stop Time 1145    PT Time Calculation (min) 41 min    Activity Tolerance Patient tolerated treatment well    Behavior During Therapy [facility] for tasks assessed/performed                     Past Medical History:  Diagnosis Date   Adenomyosis    Anemia    Arthritis 2013   L knee gets steroid injections    Asthmatic bronchitis 01/31/2017   Back pain    Chronic constipation    Diarrhea    DVT of lower extremity (deep venous thrombosis) (HCC)    Dysmenorrhea    Endometriosis    Esophageal stricture    GERD (gastroesophageal reflux disease)    History of kidney stones    History of uterine fibroid    IBS (irritable bowel syndrome)    Interstitial cystitis    Lactose intolerance    Migraine    with aura   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   PONV (postoperative nausea and vomiting)    Recurrent upper respiratory infection (URI)    Sebaceous cyst of breast, right lower inner quadrant 10/25/2012   Excised 11/21/12    Sleep apnea    Syncope, non cardiac    Past Surgical History:  Procedure Laterality Date   ARTHROSCOPIC HAGLUNDS REPAIR     BREAST CYST EXCISION Right 11/21/2012   Procedure: CYST EXCISION BREAST;  Surgeon: Sherlean JINNY Laughter, MD;  Location: [facility];  Service: General;  Laterality: Right;   BREAST CYST EXCISION Bilateral 01/18/2022   Procedure: EXCISION OF SEBACEOUS CYST BILATERAL BREAST;  Surgeon: Vanderbilt Ned, MD;  Location: [facility];  Service: General;  Laterality: Bilateral;   BREAST EXCISIONAL BIOPSY Right 11/2012   CESAREAN SECTION  04/19/1993   CHOLECYSTECTOMY      COLONOSCOPY  05/02/2011   Procedure: COLONOSCOPY;  Surgeon: Lynwood LITTIE Celestia Mickey., MD;  Location: [facility];  Service: Endoscopy;  Laterality: N/A;   colonoscopy  11/04/2014   DENTAL SURGERY  03/06/2018   2 surgeries ( 03/06/2018 and 04/06/2018)   to remove two separate benign tumors   ESOPHAGEAL MANOMETRY N/A 11/04/2015   Procedure: ESOPHAGEAL MANOMETRY (EM);  Surgeon: Lynwood Celestia, MD;  Location: [facility];  Service: Endoscopy;  Laterality: N/A;   ESOPHAGOGASTRODUODENOSCOPY  05/02/2011   Procedure: ESOPHAGOGASTRODUODENOSCOPY (EGD);  Surgeon: Lynwood LITTIE Celestia Mickey., MD;  Location: [facility];  Service: Endoscopy;  Laterality: N/A;   ESOPHAGOGASTRODUODENOSCOPY N/A 09/01/2014   Procedure: ESOPHAGOGASTRODUODENOSCOPY (EGD);  Surgeon: Lynwood Celestia, MD;  Location: [facility];  Service: Endoscopy;  Laterality: N/A;   KNEE SURGERY     LAPAROSCOPY     x 4   LESION REMOVAL N/A 01/18/2022   Procedure: EXCISION OF SEBACEOUS CYSTS CHEST AND NECK;  Surgeon: Vanderbilt Ned, MD;  Location: [facility];  Service: General;  Laterality: N/A;   PH IMPEDANCE STUDY N/A 11/04/2015   Procedure: PH IMPEDANCE STUDY;  Surgeon: Lynwood Celestia, MD;  Location: [facility];  Service: Endoscopy;  Laterality: N/A;   VAGINAL HYSTERECTOMY  2010   TVH--ovaries remain  Patient Active Problem List   Diagnosis Date Noted   Sjogren syndrome with keratoconjunctivitis (HCC) 12/09/2022   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   Elevated LFTs 04/04/2021   Colitis 04/04/2021   Bacterial overgrowth syndrome 05/21/2020   Obesity 05/21/2020   History of endometriosis 05/21/2020   Constipation due to outlet dysfunction 02/05/2020   Gastroesophageal reflux disease 02/05/2020   Obstructive sleep apnea treated with continuous positive airway pressure (CPAP) 06/16/2017   Perennial allergic rhinitis with a nonallergic component 01/31/2017   Asthmatic bronchitis 01/31/2017   GI symptoms 01/31/2017   Family history of  colon cancer 01/27/2015   Chest pain 12/13/2012   Dyspnea 12/13/2012   DVT of lower extremity (deep venous thrombosis) (HCC) 01/03/1990    PCP: Elliot Charm, MD  REFERRING PROVIDER: Cleotilde Ronal RAMAN, MD   REFERRING DIAG: M62.89 (ICD-10-CM) - Pelvic floor dysfunction  THERAPY DIAG:  Pelvic pain  Other low back pain  Muscle weakness (generalized)  Other muscle spasm  Unspecified lack of coordination  Rationale for Evaluation and Treatment: Rehabilitation  ONSET DATE: chronic  SUBJECTIVE:                                                                                                                                                                                           SUBJECTIVE STATEMENT:  Pt did not end up doing cleanse due to phlebotomy being unsuccessful in the last week. She states that x-ray showed mild-moderate stool burden and she is starting to feel more uncomfortable.  PAIN:  Are you having pain? Yes NPRS scale: 3/10 Pain location:  Lower abdominal pain, Lt sided pain, low back pain  Pain type: turning, knotted, pressure Pain description: intermittent and constant   Aggravating factors: constipation or frequent bowel movements/constantly having bowel movements  Relieving factors: exercises (bowel massage, happy baby, spinal twists, walking)  PRECAUTIONS: None  WEIGHT BEARING RESTRICTIONS: No  FALLS:  Has patient fallen in last 6 months? No  LIVING ENVIRONMENT: Lives with: lives with their spouse Lives in: House/apartment  OCCUPATION: nurse, in admin  PLOF: Independent  PATIENT GOALS: decrease pain and have more normal bowel movements  PERTINENT HISTORY:  Vaginal hysterectomy, DVT LE, endometriosis, interstitial cystitis, exploratory laps for endometriosis, c-section, cholecystectomy Sexual abuse: No  BOWEL MOVEMENT: Pain with bowel movement: Yes Type of bowel movement:Type (Bristol Stool Scale) 1-7 (sometimes full range in the same  bowel movement), Frequency sometimes many times a day, sometimes up to 4 days in between, and Strain Yes Fully empty rectum: No Leakage: No Pads: No Fiber supplement: No - has attempted metamucil and it made constipation worse  URINATION: Pain with urination: Yes Fully  empty bladder: No Stream:  varies  Urgency: Yes: all the time Frequency: multiple times an hours  Leakage: Urge to void, Walking to the bathroom, Coughing, Sneezing, Laughing, and Exercise Pads: Yes: daily  INTERCOURSE: Pain with intercourse: Initial Penetration and During Penetration Ability to have vaginal penetration:  Yes: with pain Climax: non painful WNL   PREGNANCY: Vaginal deliveries 1 Tearing Yes: 3rd degree tear C-section deliveries 1 Currently pregnant No  PROLAPSE: Periodically will feel vaginal bulging, heaviness in lower abdomen   OBJECTIVE:  11/09/22: No internal rectal or vaginal pelvic floor exam performed due to high level of skin irritation   Weak transversus abdominus contraction  Abdominal restriction and tenderness in bil lower quadrant  Unable to perform pelvic tilt with appropriate coordination  LUMBARAROM/PROM:  A/PROM A/PROM  Eval (% available) 11/09/22 (% available)  Flexion 50 90  Extension 25, pain anteriorly  25, pain in low back  Right lateral flexion 50, pain anteriorly  75, pain on right  Left lateral flexion 50, pain anteriorly  75, pain on right   (Blank rows = not tested) 07/21/22: standing prolapse assessment demonstrated grade 2 anterior vaginal wall laxity   06/08/22:               External Perineal Exam: WNL                              Internal Pelvic Floor: burning reported with palpation of superficial muscles; deep aching/pressure with palpation of Lt levator ani   Patient confirms identification and approves PT to assess internal pelvic floor and treatment Yes  PELVIC MMT:   MMT eval  Vaginal 3/5  Internal Anal Sphincter 1/5  External Anal Sphincter  2/5  Puborectalis 1/5  Diastasis Recti   (Blank rows = not tested)        TONE: High in Lt levator ani   PROLAPSE: Not able to tell this session due to dyssynergic pelvic floor contraction     06/01/22: COGNITION: Overall cognitive status: Within functional limits for tasks assessed     SENSATION: Light touch: Appears intact Proprioception: Appears intact  GAIT: Comments: decreased hip extension, forward flexed trunk  POSTURE: rounded shoulders, forward head, decreased lumbar lordosis, increased thoracic kyphosis, posterior pelvic tilt, and flexed trunk    LUMBARAROM/PROM:  A/PROM A/PROM  Eval (% available)  Flexion 50  Extension 25, pain anteriorly   Right lateral flexion 50, pain anteriorly   Left lateral flexion 50, pain anteriorly    (Blank rows = not tested)   PALPATION:   General  Significant abdominal restriction and tenderness; decreased rib mobility with mobilization and breathing    TODAY'S TREATMENT:                                                                                                                              DATE:  01/11/23 Manual: Abdominal soft tissue mobilization  Bowel mobilization Ileocecal valve myofascial release Neuromuscular re-education Diaphragmatic breathing/abdominal muscle relaxation   01/03/23 Manual: Abdominal mobilization/bowel mobilization  12/15/22 Manual: Trigger Point Dry-Needling  Treatment instructions: Expect mild to moderate muscle soreness. S/S of pneumothorax if dry needled over a lung field, and to seek immediate medical attention should they occur. Patient verbalized understanding of these instructions and education.  Patient Consent Given: Yes Education handout provided: Previously provided Muscles treated: L1-L5 lumbar multifidi Electrical stimulation performed: Yes Parameters:  30Hz  and intensity to tolerance  Treatment response/outcome: good improvement in tissue restriction Soft tissue  mobilization to lumbar paraspinals   PATIENT EDUCATION:  Education details: See above Person educated: Patient Education method: Explanation, Demonstration, Tactile cues, Verbal cues, and Handouts Education comprehension: verbalized understanding  HOME EXERCISE PROGRAM: [id number]  ASSESSMENT:  CLINICAL IMPRESSION: Pt has stool burden confirmed on x-ray. We spent time today discussing continual stool burden. Pt states that she has had x-rays after cleanse and it does not show resolution of burden; sometimes, there is not even improvement after cleanse. She was encouraged to inquire about any need for colonoscopy in order to determine other bowel pathology. She feels like most beneficial treatment when she has burden is bowel mobilization. We worked on ileocecal valve mobilization today and there was notable restriction here. Palpable burden in Rt upper abdominal quadrant. She will continue to benefit from skilled PT intervention in order to decrease pain, improve bowel movements, and decrease urinary urgency/incontinence.     OBJECTIVE IMPAIRMENTS: decreased activity tolerance, decreased coordination, decreased endurance, decreased mobility, decreased strength, increased fascial restrictions, increased muscle spasms, impaired tone, postural dysfunction, and pain.   ACTIVITY LIMITATIONS: lifting, bending, continence, and locomotion level  PARTICIPATION LIMITATIONS: interpersonal relationship, community activity, and occupation  PERSONAL FACTORS: 1 comorbidity: medical history  are also affecting patient's functional outcome.   REHAB POTENTIAL: Good  CLINICAL DECISION MAKING: Stable/uncomplicated  EVALUATION COMPLEXITY: Low   GOALS: Goals reviewed with patient? Yes  SHORT TERM GOALS: Target date: 07/06/22 - updated 07/12/22 - updated 08/18/22 - updated 09/22/22 - updated 10/26/22 - updated 11/09/22 - updated 12/15/22  Pt will be independent with HEP.   Baseline: Goal status: MET  07/12/22  2.  Pt will be independent with diaphragmatic breathing and down training activities in order to improve pelvic floor relaxation.  Baseline:  Goal status: MET 07/12/22  3.  Pt will be independent with the knack, urge suppression technique, and double voiding in order to improve bladder habits and decrease urinary incontinence.   Baseline:  Goal status: MET 09/22/22  4.  Pt will be independent with use of squatty potty, relaxed toileting mechanics, and improved bowel movement techniques in order to increase ease of bowel movements and complete evacuation.   Baseline:  Goal status: MET 11/09/22  5.  Pt will be able to correctly perform diaphragmatic breathing and appropriate pressure management in order to prevent worsening vaginal wall laxity and improve pelvic floor A/ROM.   Baseline:  Goal status: IN PROGRESS 12/15/22  LONG TERM GOALS: Target date: 11/16/2022 - updated 07/12/22 - updated 08/18/22 - updated 09/22/22 - updated 10/26/22 - updated 11/09/22 - updated 12/15/22  Pt will be independent with advanced HEP.   Baseline:  Goal status: IN PROGRESS 12/15/22  2.  Pt will demonstrate normal pelvic floor muscle tone and A/ROM, able to achieve 4/5 strength with contractions and 10 sec endurance, in order to provide appropriate lumbopelvic support in functional activities.   Baseline:  Goal status: IN PROGRESS 12/15/22  3.  Pt will increase all impaired lumbar A/ROM by 25% without pain.  Baseline:  Goal status:  IN PROGRESS 12/15/22  4.  Pt will report pain no higher than 3/10 with any activity. Baseline:  Goal status:  IN PROGRESS 12/15/22  5.  Pt will report 0/10 pain with vaginal penetration in order to improve intimate relationship with partner.    Baseline:  Goal status:  IN PROGRESS 12/15/22  6.  Pt will be able to go 2-3 hours in between voids without urgency or incontinence in order to improve QOL and perform all functional activities with less difficulty.    Baseline: urgency improving Goal status: IN PROGRESS 12/15/22  7.  Pt will report no leaks with laughing, coughing, sneezing in order to improve comfort with interpersonal relationships and community activities.   Baseline:  Goal status:  IN PROGRESS 12/15/22  8.  Pt will have consistent bowel movements 4-5x/week without straining or pain.  Baseline:  Goal status:  IN PROGRESS 12/15/22  PLAN:  PT FREQUENCY: 1-2x/week  PT DURATION: 6 months  PLANNED INTERVENTIONS: Therapeutic exercises, Therapeutic activity, Neuromuscular re-education, Balance training, Gait training, Patient/Family education, Self Care, Joint mobilization, Dry Needling, Biofeedback, and Manual therapy  PLAN FOR NEXT SESSION: Continue to work on motor control activities surrounding pelvis; progress mobility; manual techniques as needed for comfort and to increase circulation.   Josette Mares, PT, DPT01/08/2510:07 PM

## 2023-01-12 ENCOUNTER — Ambulatory Visit (HOSPITAL_COMMUNITY)
Admission: RE | Admit: 2023-01-12 | Discharge: 2023-01-12 | Disposition: A | Discharge status: Home / Self Care | Payer: Commercial Managed Care - PPO | Source: Ambulatory Visit | Attending: Vascular Surgery | Admitting: Vascular Surgery

## 2023-01-12 ENCOUNTER — Telehealth: Payer: Self-pay

## 2023-01-12 DIAGNOSIS — M79605 Pain in left leg: Secondary | ICD-10-CM | POA: Insufficient documentation

## 2023-01-12 NOTE — Telephone Encounter (Signed)
 Triage:  Patient called about having a difficult time with needle sticks related to her hemochromatosis, that she had recent lab work and her hematologist wanted her to take some blood out.  Furthermore, the lab was unsuccessful on 2 different days with a total of 10 sticks besides 100 cc withdrawal she wondered if that had anything to do with her veins.   She explained she has  worsening swelling & pain in her left leg but thought it might be related to her starting lymphedema therapy or exercising.  She describes her left leg is more swollen than usual, there is pain down the leg and worst towards the ankle, and it is a little warmer.  She does have a history of blood clots and not on anticoagulation.  With everything that has happened lately, she states she is just not sure. Ultrasound appt booked for today

## 2023-01-13 ENCOUNTER — Ambulatory Visit: Payer: Commercial Managed Care - PPO | Admitting: Occupational Therapy

## 2023-01-13 DIAGNOSIS — I890 Lymphedema, not elsewhere classified: Secondary | ICD-10-CM | POA: Diagnosis not present

## 2023-01-13 NOTE — Therapy (Signed)
 OUTPATIENT OCCUPATIONAL THERAPY TREATMENT NOTE  LOWER EXTREMITY LYMPHEDEMA  Patient Name: Karen [name] MRN: [id number] DOB:22-Jan-1970, 53 y.o., female Today's Date: 01/13/2023  END OF SESSION:   OT End of Session - 01/13/23 1108     Visit Number 13    Number of Visits 36    Date for OT Re-Evaluation 02/08/23    OT Start Time 0915    OT Stop Time 1010    OT Time Calculation (min) 55 min    Activity Tolerance Patient tolerated treatment well;No increased pain    Behavior During Therapy [facility] for tasks assessed/performed               Past Medical History:  Diagnosis Date   Adenomyosis    Anemia    Arthritis 2013   L knee gets steroid injections    Asthmatic bronchitis 01/31/2017   Back pain    Chronic constipation    Diarrhea    DVT of lower extremity (deep venous thrombosis) (HCC)    Dysmenorrhea    Endometriosis    Esophageal stricture    GERD (gastroesophageal reflux disease)    History of kidney stones    History of uterine fibroid    IBS (irritable bowel syndrome)    Interstitial cystitis    Lactose intolerance    Migraine    with aura   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   PONV (postoperative nausea and vomiting)    Recurrent upper respiratory infection (URI)    Sebaceous cyst of breast, right lower inner quadrant 10/25/2012   Excised 11/21/12    Sleep apnea    Syncope, non cardiac    Past Surgical History:  Procedure Laterality Date   ARTHROSCOPIC HAGLUNDS REPAIR     BREAST CYST EXCISION Right 11/21/2012   Procedure: CYST EXCISION BREAST;  Surgeon: Sherlean JINNY Laughter, MD;  Location: [facility];  Service: General;  Laterality: Right;   BREAST CYST EXCISION Bilateral 01/18/2022   Procedure: EXCISION OF SEBACEOUS CYST BILATERAL BREAST;  Surgeon: Vanderbilt Ned, MD;  Location: [facility];  Service: General;  Laterality: Bilateral;   BREAST EXCISIONAL BIOPSY Right 11/2012   CESAREAN SECTION  04/19/1993   CHOLECYSTECTOMY      COLONOSCOPY  05/02/2011   Procedure: COLONOSCOPY;  Surgeon: Lynwood LITTIE Celestia Mickey., MD;  Location: [facility];  Service: Endoscopy;  Laterality: N/A;   colonoscopy  11/04/2014   DENTAL SURGERY  03/06/2018   2 surgeries ( 03/06/2018 and 04/06/2018)   to remove two separate benign tumors   ESOPHAGEAL MANOMETRY N/A 11/04/2015   Procedure: ESOPHAGEAL MANOMETRY (EM);  Surgeon: Lynwood Celestia, MD;  Location: [facility];  Service: Endoscopy;  Laterality: N/A;   ESOPHAGOGASTRODUODENOSCOPY  05/02/2011   Procedure: ESOPHAGOGASTRODUODENOSCOPY (EGD);  Surgeon: Lynwood LITTIE Celestia Mickey., MD;  Location: [facility];  Service: Endoscopy;  Laterality: N/A;   ESOPHAGOGASTRODUODENOSCOPY N/A 09/01/2014   Procedure: ESOPHAGOGASTRODUODENOSCOPY (EGD);  Surgeon: Lynwood Celestia, MD;  Location: [facility];  Service: Endoscopy;  Laterality: N/A;   KNEE SURGERY     LAPAROSCOPY     x 4   LESION REMOVAL N/A 01/18/2022   Procedure: EXCISION OF SEBACEOUS CYSTS CHEST AND NECK;  Surgeon: Vanderbilt Ned, MD;  Location: [facility];  Service: General;  Laterality: N/A;   PH IMPEDANCE STUDY N/A 11/04/2015   Procedure: PH IMPEDANCE STUDY;  Surgeon: Lynwood Celestia, MD;  Location: [facility];  Service: Endoscopy;  Laterality: N/A;   VAGINAL HYSTERECTOMY  2010   TVH--ovaries remain   Patient  Active Problem List   Diagnosis Date Noted   Sjogren syndrome with keratoconjunctivitis (HCC) 12/09/2022   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   Elevated LFTs 04/04/2021   Colitis 04/04/2021   Bacterial overgrowth syndrome 05/21/2020   Obesity 05/21/2020   History of endometriosis 05/21/2020   Constipation due to outlet dysfunction 02/05/2020   Gastroesophageal reflux disease 02/05/2020   Obstructive sleep apnea treated with continuous positive airway pressure (CPAP) 06/16/2017   Perennial allergic rhinitis with a nonallergic component 01/31/2017   Asthmatic bronchitis 01/31/2017   GI symptoms 01/31/2017   Family history of  colon cancer 01/27/2015   Chest pain 12/13/2012   Dyspnea 12/13/2012   DVT of lower extremity (deep venous thrombosis) (HCC) 01/03/1990    PCP: Gaile New, MD  REFERRING PROVIDER: Valery Ripple, MD  REFERRING DIAG: I89.0  THERAPY DIAG:  Lymphedema, not elsewhere classified  Rationale for Evaluation and Treatment: Rehabilitation  ONSET DATE: ~2014; exacerbation in 2023  SUBJECTIVE:                                                                                                                                                                                           SUBJECTIVE STATEMENT: Karen [name] returns to OT for Rx visit for BLE/BLQ lymphedema care. Pt denies lymphedema related leg pain, but states she feels uncomfortable in digestive tract.. She reports phlebotomy uncsuccessful at letting blood earier in the week. Pt also reports new intermittent pain in L thigh.  PERTINENT HISTORY: CVI, OSA (no CPAP)  PAIN:  Are you having pain? Yes: NPRS scale: not rated/10 Pain location: BLE Pain description: tight, heavy, tingling, numbness Aggravating factors: standing, walking, dependent sitting > 30 minutes Relieving factors: elevation  PRECAUTIONS: Other: LYMPHEDEMA; Hx LLE DVT  RED FLAGS: Hx LLE DVT  WEIGHT BEARING RESTRICTIONS: No  FALLS:  Has patient fallen in last 6 months? No  LIVING ENVIRONMENT: Lives with: lives with spouse and daughter Lives in: House/apartment Stairs: Yes; Internal: 14 steps; yes and External: 4 steps; yes Has following equipment at home: None  OCCUPATION: [employer] Services Operational Officer, walking, desk work  LEISURE: family time  HAND DOMINANCE: right   PRIOR LEVEL OF FUNCTION: Independent  PATIENT GOALS: Feel better, be able to be more active without pain, wear preferred street shoes  OBJECTIVE: Note: Objective measures were completed at Evaluation unless otherwise noted.  COGNITION:  Overall cognitive status: Within functional limits for  tasks assessed   OBSERVATIONS / OTHER ASSESSMENTS:   POSTURE: WFL  LE ROM: WFL  LE MMT: WFL  LYMPHEDEMA ASSESSMENTS: non-Ca related  INFECTIONS: denies cellulitis and wound hx  BLE COMPARATIVE LIMB VOLUMETRICS Initial 11/15/22  LANDMARK RIGHT (  dominant)  R LEG (A-D) 3028.9 ml  R THIGH (E-G) 4809.60 ml  R FULL LIMB (A-G) 7838.5 ml  Limb Volume differential (LVD)  %  Volume change since initial %  Volume change overall V  (Blank rows = not tested)  LANDMARK LEFT  (Rx)  L LEG (A-D) 3189.9 ml  L THIGH (E-G) 4959.8 ml  L FULL LIMB (A-G) 8149.7 ml  Limb Volume differential (LVD)  LEG LVD = 5.32%, L>R; THIGH LVD = 3.12%, L>R; And full LLE LVD = 3.97%, L>R. 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 Obesity  Skin  Description Hyper-Keratosis Peau d' Orange Shiny Tight Fibrotic/ Indurated Fatty Doughy Spongy/ boggy       R>L x  x   Skin dry Flaky WNL Macerated   mildly      Color Redness Varicosities Blanching Hemosiderin Stain Mottled        x   Odor Malodorous Yeast Fungal infection  WNL      x   Temperature Warm Cool wnl     x    Pitting Edema   1+ 2+ 3+ 4+ Non-pitting         x   Girth Symmetrical Asymmetrical                   Distribution    L>R toes to groin    Stemmer Sign Positive Negative   +L -R   Lymphorrhea History Of:  Present Absent     x    Wounds History Of Present Absent Venous Arterial Pressure Sheer     x        Signs of Infection Redness Warmth Erythema Acute Swelling Drainage Borders                    Sensation Light Touch Deep pressure Hypersensitivity   In tact Impaired In tact Impaired Absent Impaired   x  x  x     Nails WNL   Fungus nail dystrophy   x     Hair Growth Symmetrical Asymmetrical   x    Skin Creases Base of toes  Ankles   Base of Fingers knees       Abdominal pannus Thigh Lobules   Face/neck   x           GAIT: Distance walked: >500' Assistive device utilized: None Level of assistance: Complete Independence Comments: Functional ambulation and transfers [facility]  LYMPHEDEMA LIFE IMPACT SCALE (LLIS): Initial 11/15/22  50%  FOTO functional outcome measure: TBA initial Rx visit 2/2 time constraints  TODAY'S TREATMENT:  MLD to BLE/BLQ, emphasis on colon and LLE Pt edu re lymphatic structure and function, and LE self care home program  PATIENT EDUCATION:  Continued Pt/ CG edu for how function of cisterna chyli, part of the thoracic duct of deep abdominal lymphatics, assists with digestion by filtering many of the fats and proteins from the digestive system. Sub optimal lymphatic flow in this region may result in constipation, bloating, swelling, etc. MLD helps mobilize these protein and fats , and the rhythmic movement of manual lymphatic drainage stimulates digestive muscles and increase peristalsis. Provided printed resource for reference.  Continued lymphedema self care home program training throughout session. Topics include outcome of comparative limb volumetrics- starting limb volume differentials (LVDs), technology and gradient techniques used for short stretch, multilayer compression wrapping, simple self-MLD, therapeutic lymphatic pumping exercises, skin/nail care, LE precautions,. compression garment recommendations and specifications, wear and care schedule and compression garment donning / doffing w assistive devices. Discussed progress towards all OT goals since commencing CDT. All questions answered to the Pt's satisfaction. Good return. Person educated: Patient  Education method: Explanation, Demonstration, and Handouts Education comprehension: verbalized understanding, returned demonstration, verbal cues required, and needs further  education  HOME EXERCISE PROGRAM: BLE lymphatic pumping there ex using- 1 sets of 10 reps, each exercise in order-  1-2 x daily, bilaterally Simple self MLD 1 x daily Daily skin care to increase hydration, skin mobility and decrease infection risk- can be done during MLD Compression wraps 23/7 during intensive phase of CDT Compression garments/ devices during self-management phase of CDT  ASSESSMENT:  CLINICAL IMPRESSION:  Continued Pt/ CG edu for how function of cisterna chyli, part of the thoracic duct of deep abdominal lymphatics, assists with digestion by filtering many of the fats and proteins from the digestive system. Sub optimal lymphatic flow in this region may result in constipation, bloating, swelling, etc. MLD helps mobilize these proteins and fats , and the rhythmic movement of manual lymphatic drainage stimulates digestive muscles and increase peristalsis. Pt agrees with plan to continue MLD according to POC unless directed by her oncologist to stop.  Pt tolerated RLE/ BLQ MLD as established.  Leg swelling appears unchanged after session. Pt has not yet ordered compression knee highs due to hectic schedule  over the holidays and also  increased frequency of medical appointments. Pt is in touch w DME vendor. Cont as per POC.  11/15/22 Initial Evaluation: Maritta Kief is a 53 y.o. female presenting with very mild, stage II, LLE lymphedema 2/2 venous insufficiency and obesity. L Leg swelling and associated pain swelling fluctuates. It has progressed over time, and now no longer resolves with elevation over night. RLE swelling limits balance during functional ambulation. It exacerbates infection and falls risk. LLE/RLQ lymphedema interferes with functional performance in all occupational domains, including basic and instrumental ADLs, productive activities, leisure pursuits and social participation. Pt will benefit from Occupational Therapy for Complete Decongestive Therapy (CDT) to restore  function, to reduce physical and psychologic suffering associated with chronic, progressive lymphedema and associated pain, and to limit infection. CDT will include manual lymphatic drainage (MLD), skin care, therapeutic exercise and compression wraps and garment's devices. Without skilled OT for lymphedema care the condition will worsen and further functional decline is expected  Custom-made gradient compression garments and HOS devices are medically necessary because they are uniquely sized and shaped to fit the exact dimensions of the affected extremities, and to provide appropriate medical grade, graduated compression essential for optimally managing chronic, progressive lymphedema. Multiple custom  compression garments are needed to ensure proper hygiene to limit infection risk. Custom compression garments should be replaced q 3-6 months When worn consistently for optimal lipo-lymphedema self-management over time. HOS devices, medically necessary to limit fibrosis buildup in tissue, should be replaced q 2 years and PRN when worn out.      OBJECTIVE IMPAIRMENTS: decreased activity tolerance, decreased knowledge of condition, decreased knowledge of use of DME, increased edema, impaired sensation, pain, and chronic, progressive leg swelling .   ACTIVITY LIMITATIONS: dependent sitting, extended standing and/ or walking, squatting, and lower body dressing, fitting preferred street shoes  PARTICIPATION LIMITATIONS: shopping, community activity, occupation, and yard work  PERSONAL FACTORS: 3+ comorbidities: Hx DVT,  OSA, CVI. Varicose veins  are also affecting patient's functional outcome.   REHAB POTENTIAL: Good  EVALUATION COMPLEXITY: Moderate   GOALS: Goals reviewed with patient? Yes  SHORT TERM GOALS: Target date: 4th OT Rx visit  SHORT TERM GOALS: Target date: 4th OT Rx visit   Pt will demonstrate understanding of lymphedema precautions and prevention strategies with modified independence  using a printed reference to identify at least 5 precautions and discussing how s/he may implement them into daily life to reduce risk of progression with extra time. Baseline:Max A Goal status: INITIAL   Pt will be able to apply multilayer, knee length, gradient, compression wraps to one leg at a time with modified assistance (extra time and assistive device/s) to decrease limb volume, to limit infection risk, and to limit lymphedema progression.  Baseline: Dependent Goal status: INITIAL  LONG TERM GOALS: Target date: 02/08/23  Given this patient's Intake score of TBA /100% on the functional outcomes FOTO tool, patient will experience an increase in function of 5 points to improve basic and instrumental ADLs performance, including lymphedema self-care.  Baseline: Max A Goal status: INITIAL   Given this patient's Intake score of TBA% on the Lymphedema Life Impact Scale (LLIS), patient will experience a reduction of at least 5 points in her perceived level of functional impairment resulting from lymphedema to improve functional performance and quality of life (QOL). Baseline: 64.71% Goal status: INITIAL  3 Pt will achieve at least a 10% volume reduction in B legs to return limb to typical size and shape, to limit infection risk and LE progression, to decrease pain, to improve function. Baseline: Dependent Goal status: INITIAL  4.  Pt will obtain proper compression garments/devices and achieve modified independence (extra time + assistive devices) with donning/doffing to optimize limb volume reductions and limit LE  progression over time. Baseline:  Goal status: INITIAL  5.  During Intensive phase CDT , with modified independence, Pt will achieve at least 85% compliance with all lymphedema self-care home program components, including daily skin care, compression wraps and /or garments, simple self MLD and lymphatic pumping therex to habituate LE self care protocol  into ADLs for optimal LE  self-management over time. Baseline: Dependent Goal status: INITIAL   PLAN:  OT FREQUENCY: 2x/week  OT DURATION: 12 weeks  PLANNED INTERVENTIONS: 97110-Therapeutic exercises, 97530- Therapeutic activity, 97535- Self Care, [id number]- Manual therapy, Patient/Family education, Manual lymph drainage, Compression bandaging, and fit with appropriate compression garments  PLAN FOR NEXT SESSION:  Cont MLD Cont Pt edu for LE self-care  Zebedee Dec, MS, OTR/L, CLT-LANA 01/13/23 11:10 AM '

## 2023-01-16 ENCOUNTER — Ambulatory Visit: Payer: Commercial Managed Care - PPO | Admitting: Occupational Therapy

## 2023-01-18 ENCOUNTER — Ambulatory Visit: Payer: Commercial Managed Care - PPO | Admitting: Occupational Therapy

## 2023-01-18 ENCOUNTER — Ambulatory Visit: Payer: Self-pay

## 2023-01-18 ENCOUNTER — Encounter: Payer: Self-pay | Admitting: Occupational Therapy

## 2023-01-18 DIAGNOSIS — R279 Unspecified lack of coordination: Secondary | ICD-10-CM | POA: Diagnosis not present

## 2023-01-18 DIAGNOSIS — I890 Lymphedema, not elsewhere classified: Secondary | ICD-10-CM | POA: Diagnosis not present

## 2023-01-18 DIAGNOSIS — M6281 Muscle weakness (generalized): Secondary | ICD-10-CM

## 2023-01-18 DIAGNOSIS — M62838 Other muscle spasm: Secondary | ICD-10-CM | POA: Diagnosis not present

## 2023-01-18 DIAGNOSIS — R102 Pelvic and perineal pain unspecified side: Secondary | ICD-10-CM

## 2023-01-18 DIAGNOSIS — M5459 Other low back pain: Secondary | ICD-10-CM

## 2023-01-18 NOTE — Therapy (Signed)
 OUTPATIENT PHYSICAL THERAPY FEMALE PELVIC TREATMENT   Patient Name: Karen [name] MRN: [id number] DOB:1970/10/30, 53 y.o., female Today's Date: 01/18/2023  END OF SESSION:  PT End of Session - 01/18/23 1619     Visit Number 26    Date for PT Re-Evaluation 04/26/23    Authorization Type [employer]    PT Start Time 1618    PT Stop Time 1658    PT Time Calculation (min) 40 min    Activity Tolerance Patient tolerated treatment well    Behavior During Therapy [facility] for tasks assessed/performed                     Past Medical History:  Diagnosis Date   Adenomyosis    Anemia    Arthritis 2013   L knee gets steroid injections    Asthmatic bronchitis 01/31/2017   Back pain    Chronic constipation    Diarrhea    DVT of lower extremity (deep venous thrombosis) (HCC)    Dysmenorrhea    Endometriosis    Esophageal stricture    GERD (gastroesophageal reflux disease)    History of kidney stones    History of uterine fibroid    IBS (irritable bowel syndrome)    Interstitial cystitis    Lactose intolerance    Migraine    with aura   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   PONV (postoperative nausea and vomiting)    Recurrent upper respiratory infection (URI)    Sebaceous cyst of breast, right lower inner quadrant 10/25/2012   Excised 11/21/12    Sleep apnea    Syncope, non cardiac    Past Surgical History:  Procedure Laterality Date   ARTHROSCOPIC HAGLUNDS REPAIR     BREAST CYST EXCISION Right 11/21/2012   Procedure: CYST EXCISION BREAST;  Surgeon: Darcella Earnest, MD;  Location: [facility];  Service: General;  Laterality: Right;   BREAST CYST EXCISION Bilateral 01/18/2022   Procedure: EXCISION OF SEBACEOUS CYST BILATERAL BREAST;  Surgeon: Sim Dryer, MD;  Location: [facility];  Service: General;  Laterality: Bilateral;   BREAST EXCISIONAL BIOPSY Right 11/2012   CESAREAN SECTION  04/19/1993   CHOLECYSTECTOMY      COLONOSCOPY  05/02/2011   Procedure: COLONOSCOPY;  Surgeon: Venson Ginger., MD;  Location: [facility];  Service: Endoscopy;  Laterality: N/A;   colonoscopy  11/04/2014   DENTAL SURGERY  03/06/2018   2 surgeries ( 03/06/2018 and 04/06/2018)   to remove two separate benign tumors   ESOPHAGEAL MANOMETRY N/A 11/04/2015   Procedure: ESOPHAGEAL MANOMETRY (EM);  Surgeon: Jolinda Necessary, MD;  Location: [facility];  Service: Endoscopy;  Laterality: N/A;   ESOPHAGOGASTRODUODENOSCOPY  05/02/2011   Procedure: ESOPHAGOGASTRODUODENOSCOPY (EGD);  Surgeon: Venson Ginger., MD;  Location: [facility];  Service: Endoscopy;  Laterality: N/A;   ESOPHAGOGASTRODUODENOSCOPY N/A 09/01/2014   Procedure: ESOPHAGOGASTRODUODENOSCOPY (EGD);  Surgeon: Jolinda Necessary, MD;  Location: [facility];  Service: Endoscopy;  Laterality: N/A;   KNEE SURGERY     LAPAROSCOPY     x 4   LESION REMOVAL N/A 01/18/2022   Procedure: EXCISION OF SEBACEOUS CYSTS CHEST AND NECK;  Surgeon: Sim Dryer, MD;  Location: [facility];  Service: General;  Laterality: N/A;   PH IMPEDANCE STUDY N/A 11/04/2015   Procedure: PH IMPEDANCE STUDY;  Surgeon: Jolinda Necessary, MD;  Location: [facility];  Service: Endoscopy;  Laterality: N/A;   VAGINAL HYSTERECTOMY  2010   TVH--ovaries remain  Patient Active Problem List   Diagnosis Date Noted   Sjogren syndrome with keratoconjunctivitis (HCC) 12/09/2022   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   Elevated LFTs 04/04/2021   Colitis 04/04/2021   Bacterial overgrowth syndrome 05/21/2020   Obesity 05/21/2020   History of endometriosis 05/21/2020   Constipation due to outlet dysfunction 02/05/2020   Gastroesophageal reflux disease 02/05/2020   Obstructive sleep apnea treated with continuous positive airway pressure (CPAP) 06/16/2017   Perennial allergic rhinitis with a nonallergic component 01/31/2017   Asthmatic bronchitis 01/31/2017   GI symptoms 01/31/2017   Family history of  colon cancer 01/27/2015   Chest pain 12/13/2012   Dyspnea 12/13/2012   DVT of lower extremity (deep venous thrombosis) (HCC) 01/03/1990    PCP: Arva Lathe, MD  REFERRING PROVIDER: Lillian Rein, MD   REFERRING DIAG: M62.89 (ICD-10-CM) - Pelvic floor dysfunction  THERAPY DIAG:  Pelvic pain  Other low back pain  Muscle weakness (generalized)  Other muscle spasm  Unspecified lack of coordination  Rationale for Evaluation and Treatment: Rehabilitation  ONSET DATE: chronic  SUBJECTIVE:                                                                                                                                                                                           SUBJECTIVE STATEMENT:  Pt states that she is not taking the Miralax  at all right now. She has not done cleanse due to still waiting to have successful phlebotomy. She states that with the exception of yesterday, she has had a bowel movement daily. They are more well formed than typical (typical is diarrhea).  PAIN:  Are you having pain? Yes NPRS scale: 3/10 Pain location:  Lower abdominal pain, Lt sided pain, low back pain  Pain type: turning, knotted, pressure Pain description: intermittent and constant   Aggravating factors: constipation or frequent bowel movements/constantly having bowel movements  Relieving factors: exercises (bowel massage, happy baby, spinal twists, walking)  PRECAUTIONS: None  WEIGHT BEARING RESTRICTIONS: No  FALLS:  Has patient fallen in last 6 months? No  LIVING ENVIRONMENT: Lives with: lives with their spouse Lives in: House/apartment  OCCUPATION: nurse, in admin  PLOF: Independent  PATIENT GOALS: decrease pain and have more normal bowel movements  PERTINENT HISTORY:  Vaginal hysterectomy, DVT LE, endometriosis, interstitial cystitis, exploratory laps for endometriosis, c-section, cholecystectomy Sexual abuse: No  BOWEL MOVEMENT: Pain with bowel movement:  Yes Type of bowel movement:Type (Bristol Stool Scale) 1-7 (sometimes full range in the same bowel movement), Frequency sometimes many times a day, sometimes up to 4 days in between, and Strain Yes Fully empty rectum: No Leakage: No Pads: No  Fiber supplement: No - has attempted metamucil and it made constipation worse  URINATION: Pain with urination: Yes Fully empty bladder: No Stream:  varies  Urgency: Yes: all the time Frequency: multiple times an hours  Leakage: Urge to void, Walking to the bathroom, Coughing, Sneezing, Laughing, and Exercise Pads: Yes: daily  INTERCOURSE: Pain with intercourse: Initial Penetration and During Penetration Ability to have vaginal penetration:  Yes: with pain Climax: non painful WNL   PREGNANCY: Vaginal deliveries 1 Tearing Yes: 3rd degree tear C-section deliveries 1 Currently pregnant No  PROLAPSE: Periodically will feel vaginal bulging, heaviness in lower abdomen   OBJECTIVE:  11/09/22: No internal rectal or vaginal pelvic floor exam performed due to high level of skin irritation   Weak transversus abdominus contraction  Abdominal restriction and tenderness in bil lower quadrant  Unable to perform pelvic tilt with appropriate coordination  LUMBARAROM/PROM:  A/PROM A/PROM  Eval (% available) 11/09/22 (% available)  Flexion 50 90  Extension 25, pain anteriorly  25, pain in low back  Right lateral flexion 50, pain anteriorly  75, pain on right  Left lateral flexion 50, pain anteriorly  75, pain on right   (Blank rows = not tested) 07/21/22: standing prolapse assessment demonstrated grade 2 anterior vaginal wall laxity   06/08/22:               External Perineal Exam: WNL                              Internal Pelvic Floor: burning reported with palpation of superficial muscles; deep aching/pressure with palpation of Lt levator ani   Patient confirms identification and approves PT to assess internal pelvic floor and treatment  Yes  PELVIC MMT:   MMT eval  Vaginal 3/5  Internal Anal Sphincter 1/5  External Anal Sphincter 2/5  Puborectalis 1/5  Diastasis Recti   (Blank rows = not tested)        TONE: High in Lt levator ani   PROLAPSE: Not able to tell this session due to dyssynergic pelvic floor contraction     06/01/22: COGNITION: Overall cognitive status: Within functional limits for tasks assessed     SENSATION: Light touch: Appears intact Proprioception: Appears intact  GAIT: Comments: decreased hip extension, forward flexed trunk  POSTURE: rounded shoulders, forward head, decreased lumbar lordosis, increased thoracic kyphosis, posterior pelvic tilt, and flexed trunk    LUMBARAROM/PROM:  A/PROM A/PROM  Eval (% available)  Flexion 50  Extension 25, pain anteriorly   Right lateral flexion 50, pain anteriorly   Left lateral flexion 50, pain anteriorly    (Blank rows = not tested)   PALPATION:   General  Significant abdominal restriction and tenderness; decreased rib mobility with mobilization and breathing    TODAY'S TREATMENT:  DATE:  01/18/23 Manual: Abdominal soft tissue mobilization  Bowel mobilization Ileocecal valve myofascial release Exercises: Seated forward fold 15 breaths Lateral side body stretch 10 breaths bil Open books 10x bil Lower trunk rotation wide feet 10x   01/11/23 Manual: Abdominal soft tissue mobilization  Bowel mobilization Ileocecal valve myofascial release Neuromuscular re-education Diaphragmatic breathing/abdominal muscle relaxation   01/03/23 Manual: Abdominal mobilization/bowel mobilization   PATIENT EDUCATION:  Education details: See above Person educated: Patient Education method: Programmer, multimedia, Demonstration, Tactile cues, Verbal cues, and Handouts Education comprehension: verbalized understanding  HOME  EXERCISE PROGRAM: [id number]  ASSESSMENT:  CLINICAL IMPRESSION: Pt doing well having consistent bowel movements without being on Miralax , but does feel like she is very full and still has a stool burden. Hopeful that more formed bowel movements not being on Miralax  would be helpful to improve complete emptying and appropriate peristalsis action since she normally has diarrhea. We will continue to monitor to see what improvements, if any, this helps with. She tolerated all mobility exercises well today with no pain or discomfort. Manual techniques continued to help keep good bowel motility going since patient feels like this is veyr helpful.  She will continue to benefit from skilled PT intervention in order to decrease pain, improve bowel movements, and decrease urinary urgency/incontinence.     OBJECTIVE IMPAIRMENTS: decreased activity tolerance, decreased coordination, decreased endurance, decreased mobility, decreased strength, increased fascial restrictions, increased muscle spasms, impaired tone, postural dysfunction, and pain.   ACTIVITY LIMITATIONS: lifting, bending, continence, and locomotion level  PARTICIPATION LIMITATIONS: interpersonal relationship, community activity, and occupation  PERSONAL FACTORS: 1 comorbidity: medical history  are also affecting patient's functional outcome.   REHAB POTENTIAL: Good  CLINICAL DECISION MAKING: Stable/uncomplicated  EVALUATION COMPLEXITY: Low   GOALS: Goals reviewed with patient? Yes  SHORT TERM GOALS: Target date: 07/06/22 - updated 07/12/22 - updated 08/18/22 - updated 09/22/22 - updated 10/26/22 - updated 11/09/22 - updated 12/15/22 updated 01/18/23  Pt will be independent with HEP.   Baseline: Goal status: MET 07/12/22  2.  Pt will be independent with diaphragmatic breathing and down training activities in order to improve pelvic floor relaxation.  Baseline:  Goal status: MET 07/12/22  3.  Pt will be independent with the knack, urge  suppression technique, and double voiding in order to improve bladder habits and decrease urinary incontinence.   Baseline:  Goal status: MET 09/22/22  4.  Pt will be independent with use of squatty potty, relaxed toileting mechanics, and improved bowel movement techniques in order to increase ease of bowel movements and complete evacuation.   Baseline:  Goal status: MET 11/09/22  5.  Pt will be able to correctly perform diaphragmatic breathing and appropriate pressure management in order to prevent worsening vaginal wall laxity and improve pelvic floor A/ROM.   Baseline:  Goal status: MET 01/18/23  LONG TERM GOALS: Target date: 11/16/2022 - updated 07/12/22 - updated 08/18/22 - updated 09/22/22 - updated 10/26/22 - updated 11/09/22 - updated 12/15/22 - updated 01/18/23  Pt will be independent with advanced HEP.   Baseline:  Goal status: IN PROGRESS 01/18/23  2.  Pt will demonstrate normal pelvic floor muscle tone and A/ROM, able to achieve 4/5 strength with contractions and 10 sec endurance, in order to provide appropriate lumbopelvic support in functional activities.   Baseline:  Goal status: IN PROGRESS 12/15/22  3.  Pt will increase all impaired lumbar A/ROM by 25% without pain.  Baseline:  Goal status:  IN PROGRESS 12/15/22  4.  Pt will  report pain no higher than 3/10 with any activity. Baseline:  Goal status:  IN PROGRESS 12/15/22  5.  Pt will report 0/10 pain with vaginal penetration in order to improve intimate relationship with partner.    Baseline:  Goal status:  IN PROGRESS 12/15/22  6.  Pt will be able to go 2-3 hours in between voids without urgency or incontinence in order to improve QOL and perform all functional activities with less difficulty.   Baseline:  Goal status: MET 01/18/23  7.  Pt will report no leaks with laughing, coughing, sneezing in order to improve comfort with interpersonal relationships and community activities.   Baseline:  Goal status:  IN  PROGRESS 01/18/23  8.  Pt will have consistent bowel movements 4-5x/week without straining or pain.  Baseline:  Goal status:  MET 01/18/23  PLAN:  PT FREQUENCY: 1-2x/week  PT DURATION: 6 months  PLANNED INTERVENTIONS: Therapeutic exercises, Therapeutic activity, Neuromuscular re-education, Balance training, Gait training, Patient/Family education, Self Care, Joint mobilization, Dry Needling, Biofeedback, and Manual therapy  PLAN FOR NEXT SESSION: Continue to work on motor control activities surrounding pelvis; progress mobility; manual techniques as needed for comfort and to increase circulation.   Verlena Glenn, PT, DPT01/15/254:19 PM

## 2023-01-19 ENCOUNTER — Inpatient Hospital Stay: Payer: Commercial Managed Care - PPO

## 2023-01-19 DIAGNOSIS — N200 Calculus of kidney: Secondary | ICD-10-CM | POA: Diagnosis not present

## 2023-01-19 DIAGNOSIS — R350 Frequency of micturition: Secondary | ICD-10-CM | POA: Diagnosis not present

## 2023-01-19 NOTE — Therapy (Signed)
OUTPATIENT OCCUPATIONAL THERAPY TREATMENT NOTE  LOWER EXTREMITY LYMPHEDEMA  Patient Name: Karen [name] MRN: [id number] DOB:[birth year], 53 y.o., female Today's Date: 01/19/2023  END OF SESSION:   OT End of Session - 01/18/23 1112     Visit Number 14    Number of Visits 36    Date for OT Re-Evaluation 02/08/23    OT Start Time 1105    OT Stop Time 1205    OT Time Calculation (min) 60 min    Activity Tolerance Patient tolerated treatment well;No increased pain    Behavior During Therapy [facility] for tasks assessed/performed               Past Medical History:  Diagnosis Date   Adenomyosis    Anemia    Arthritis 2013   L knee gets steroid injections    Asthmatic bronchitis 01/31/2017   Back pain    Chronic constipation    Diarrhea    DVT of lower extremity (deep venous thrombosis) (HCC)    Dysmenorrhea    Endometriosis    Esophageal stricture    GERD (gastroesophageal reflux disease)    History of kidney stones    History of uterine fibroid    IBS (irritable bowel syndrome)    Interstitial cystitis    Lactose intolerance    Migraine    with aura   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   PONV (postoperative nausea and vomiting)    Recurrent upper respiratory infection (URI)    Sebaceous cyst of breast, right lower inner quadrant 10/25/2012   Excised 11/21/12    Sleep apnea    Syncope, non cardiac    Past Surgical History:  Procedure Laterality Date   ARTHROSCOPIC HAGLUNDS REPAIR     BREAST CYST EXCISION Right 11/21/2012   Procedure: CYST EXCISION BREAST;  Surgeon: Currie Paris, MD;  Location: [facility];  Service: General;  Laterality: Right;   BREAST CYST EXCISION Bilateral 01/18/2022   Procedure: EXCISION OF SEBACEOUS CYST BILATERAL BREAST;  Surgeon: Harriette Bouillon, MD;  Location: [facility];  Service: General;  Laterality: Bilateral;   BREAST EXCISIONAL BIOPSY Right 11/2012   CESAREAN SECTION  04/19/1993   CHOLECYSTECTOMY      COLONOSCOPY  05/02/2011   Procedure: COLONOSCOPY;  Surgeon: Vertell Novak., MD;  Location: [facility];  Service: Endoscopy;  Laterality: N/A;   colonoscopy  11/04/2014   DENTAL SURGERY  03/06/2018   2 surgeries ( 03/06/2018 and 04/06/2018)   to remove two separate benign tumors   ESOPHAGEAL MANOMETRY N/A 11/04/2015   Procedure: ESOPHAGEAL MANOMETRY (EM);  Surgeon: Carman Ching, MD;  Location: [facility];  Service: Endoscopy;  Laterality: N/A;   ESOPHAGOGASTRODUODENOSCOPY  05/02/2011   Procedure: ESOPHAGOGASTRODUODENOSCOPY (EGD);  Surgeon: Vertell Novak., MD;  Location: [facility];  Service: Endoscopy;  Laterality: N/A;   ESOPHAGOGASTRODUODENOSCOPY N/A 09/01/2014   Procedure: ESOPHAGOGASTRODUODENOSCOPY (EGD);  Surgeon: Carman Ching, MD;  Location: [facility];  Service: Endoscopy;  Laterality: N/A;   KNEE SURGERY     LAPAROSCOPY     x 4   LESION REMOVAL N/A 01/18/2022   Procedure: EXCISION OF SEBACEOUS CYSTS CHEST AND NECK;  Surgeon: Harriette Bouillon, MD;  Location: [facility];  Service: General;  Laterality: N/A;   PH IMPEDANCE STUDY N/A 11/04/2015   Procedure: PH IMPEDANCE STUDY;  Surgeon: Carman Ching, MD;  Location: [facility];  Service: Endoscopy;  Laterality: N/A;   VAGINAL HYSTERECTOMY  2010   TVH--ovaries remain   Patient  Active Problem List   Diagnosis Date Noted   Sjogren syndrome with keratoconjunctivitis (HCC) 12/09/2022   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   Elevated LFTs 04/04/2021   Colitis 04/04/2021   Bacterial overgrowth syndrome 05/21/2020   Obesity 05/21/2020   History of endometriosis 05/21/2020   Constipation due to outlet dysfunction 02/05/2020   Gastroesophageal reflux disease 02/05/2020   Obstructive sleep apnea treated with continuous positive airway pressure (CPAP) 06/16/2017   Perennial allergic rhinitis with a nonallergic component 01/31/2017   Asthmatic bronchitis 01/31/2017   GI symptoms 01/31/2017   Family history of  colon cancer 01/27/2015   Chest pain 12/13/2012   Dyspnea 12/13/2012   DVT of lower extremity (deep venous thrombosis) (HCC) 01/03/1990    PCP: Coral Else, MD  REFERRING PROVIDER: Lorenda Ishihara, MD  REFERRING DIAG: I89.0  THERAPY DIAG:  Lymphedema, not elsewhere classified  Rationale for Evaluation and Treatment: Rehabilitation  ONSET DATE: ~2014; exacerbation in 2023  SUBJECTIVE:                                                                                                                                                                                           SUBJECTIVE STATEMENT: Briggitte returns to OT for Rx visit for BLE/BLQ lymphedema care. Pt denies lymphedema related leg pain, but states she feels uncomfortable in digestive tract.. She reports phlebotomy unsuccessful at letting blood earlier in the week. Pt also reports new intermittent pain in L thigh.  PERTINENT HISTORY: CVI, OSA (no CPAP)  PAIN:  Are you having pain? Yes: NPRS scale: not rated/10 Pain location: BLE Pain description: tight, heavy, tingling, numbness Aggravating factors: standing, walking, dependent sitting > 30 minutes Relieving factors: elevation  PRECAUTIONS: Other: LYMPHEDEMA; Hx LLE DVT  RED FLAGS: Hx LLE DVT  WEIGHT BEARING RESTRICTIONS: No  FALLS:  Has patient fallen in last 6 months? No  LIVING ENVIRONMENT: Lives with: lives with spouse and daughter Lives in: House/apartment Stairs: Yes; Internal: 14 steps; yes and External: 4 steps; yes Has following equipment at home: None  OCCUPATION: [employer] operational officer, walking, desk work  LEISURE: family time  HAND DOMINANCE: right   PRIOR LEVEL OF FUNCTION: Independent  PATIENT GOALS: Feel better, be able to be more active without pain, wear preferred street shoes  OBJECTIVE: Note: Objective measures were completed at Evaluation unless otherwise noted.  COGNITION:  Overall cognitive status: Within functional limits for  tasks assessed   OBSERVATIONS / OTHER ASSESSMENTS:   POSTURE: WFL  LE ROM: WFL  LE MMT: WFL  LYMPHEDEMA ASSESSMENTS: non-Ca related  INFECTIONS: denies cellulitis and wound hx  BLE COMPARATIVE LIMB VOLUMETRICS Initial 11/15/22  LANDMARK RIGHT (  dominant)  R LEG (A-D) 3028.9 ml  R THIGH (E-G) 4809.60 ml  R FULL LIMB (A-G) 7838.5 ml  Limb Volume differential (LVD)  %  Volume change since initial %  Volume change overall V  (Blank rows = not tested)  LANDMARK LEFT  (Rx)  L LEG (A-D) 3189.9 ml  L THIGH (E-G) 4959.8 ml  L FULL LIMB (A-G) 8149.7 ml  Limb Volume differential (LVD)  LEG LVD = 5.32%, L>R; THIGH LVD = 3.12%, L>R; And full LLE LVD = 3.97%, L>R. 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 Obesity  Skin  Description Hyper-Keratosis Peau d' Orange Shiny Tight Fibrotic/ Indurated Fatty Doughy Spongy/ boggy       R>L x  x   Skin dry Flaky WNL Macerated   mildly      Color Redness Varicosities Blanching Hemosiderin Stain Mottled        x   Odor Malodorous Yeast Fungal infection  WNL      x   Temperature Warm Cool wnl     x    Pitting Edema   1+ 2+ 3+ 4+ Non-pitting         x   Girth Symmetrical Asymmetrical                   Distribution    L>R toes to groin    Stemmer Sign Positive Negative   +L -R   Lymphorrhea History Of:  Present Absent     x    Wounds History Of Present Absent Venous Arterial Pressure Sheer     x        Signs of Infection Redness Warmth Erythema Acute Swelling Drainage Borders                    Sensation Light Touch Deep pressure Hypersensitivity   In tact Impaired In tact Impaired Absent Impaired   x  x  x     Nails WNL   Fungus nail dystrophy   x     Hair Growth Symmetrical Asymmetrical   x    Skin Creases Base of toes  Ankles   Base of Fingers knees       Abdominal pannus Thigh Lobules   Face/neck   x           GAIT: Distance walked: >500' Assistive device utilized: None Level of assistance: Complete Independence Comments: Functional ambulation and transfers [facility]  LYMPHEDEMA LIFE IMPACT SCALE (LLIS): Initial 11/15/22  50%  FOTO functional outcome measure: TBA initial Rx visit 2/2 time constraints  TODAY'S TREATMENT:  MLD to BLE/BLQ, emphasis on colon and LLE Pt edu re lymphatic structure and function, and LE self care home program  PATIENT EDUCATION:  Continued Pt/ CG edu for how function of cisterna chyli, part of the thoracic duct of deep abdominal lymphatics, assists with digestion by filtering many of the fats and proteins from the digestive system. Sub optimal lymphatic flow in this region may result in constipation, bloating, swelling, etc. MLD helps mobilize these protein and fats , and the rhythmic movement of manual lymphatic drainage stimulates digestive muscles and increase peristalsis. Provided printed resource for reference.  Continued lymphedema self care home program training throughout session. Topics include outcome of comparative limb volumetrics- starting limb volume differentials (LVDs), technology and gradient techniques used for short stretch, multilayer compression wrapping, simple self-MLD, therapeutic lymphatic pumping exercises, skin/nail care, LE precautions,. compression garment recommendations and specifications, wear and care schedule and compression garment donning / doffing w assistive devices. Discussed progress towards all OT goals since commencing CDT. All questions answered to the Pt's satisfaction. Good return. Person educated: Patient  Education method: Explanation, Demonstration, and Handouts Education comprehension: verbalized understanding, returned demonstration, verbal cues required, and needs further  education  HOME EXERCISE PROGRAM: BLE lymphatic pumping there ex using- 1 sets of 10 reps, each exercise in order-  1-2 x daily, bilaterally Simple self MLD 1 x daily Daily skin care to increase hydration, skin mobility and decrease infection risk- can be done during MLD Compression wraps 23/7 during intensive phase of CDT Compression garments/ devices during self-management phase of CDT  ASSESSMENT:  CLINICAL IMPRESSION:  March Nesheim presents to OT for lymphedema care of the deep abdominal lymphatics and LLE swelling. Pt reports intestinal discomfort persists, but lymphatic massage is reducing discomfort and improving bowel motility and productivity. Continued MLD to ascending, transverse and descending colon in supine and side lying without increased pain. Pt performed diaphragmatic breathing throughout session. Last 15 minutes addressed LLE swelling. Pt has not yet ordered recommended compression garments. Cont as per POC.  11/15/22 Initial Evaluation: Candee Heidorn is a 53 y.o. female presenting with very mild, stage II, LLE lymphedema 2/2 venous insufficiency and obesity. L Leg swelling and associated pain swelling fluctuates. It has progressed over time, and now no longer resolves with elevation over night. RLE swelling limits balance during functional ambulation. It exacerbates infection and falls risk. LLE/RLQ lymphedema interferes with functional performance in all occupational domains, including basic and instrumental ADLs, productive activities, leisure pursuits and social participation. Pt will benefit from Occupational Therapy for Complete Decongestive Therapy (CDT) to restore function, to reduce physical and psychologic suffering associated with chronic, progressive lymphedema and associated pain, and to limit infection. CDT will include manual lymphatic drainage (MLD), skin care, therapeutic exercise and compression wraps and garment's devices. Without skilled OT for lymphedema care  the condition will worsen and further functional decline is expected  Custom-made gradient compression garments and HOS devices are medically necessary because they are uniquely sized and shaped to fit the exact dimensions of the affected extremities, and to provide appropriate medical grade, graduated compression essential for optimally managing chronic, progressive lymphedema. Multiple custom compression garments are needed to ensure proper hygiene to limit infection risk. Custom compression garments should be replaced q 3-6 months When worn consistently for optimal lipo-lymphedema self-management over time. HOS devices, medically necessary to limit fibrosis buildup in tissue, should be replaced q 2 years and PRN when worn out.      OBJECTIVE IMPAIRMENTS: decreased activity tolerance, decreased knowledge of  condition, decreased knowledge of use of DME, increased edema, impaired sensation, pain, and chronic, progressive leg swelling .   ACTIVITY LIMITATIONS: dependent sitting, extended standing and/ or walking, squatting, and lower body dressing, fitting preferred street shoes  PARTICIPATION LIMITATIONS: shopping, community activity, occupation, and yard work  PERSONAL FACTORS: 3+ comorbidities: Hx DVT,  OSA, CVI. Varicose veins  are also affecting patient's functional outcome.   REHAB POTENTIAL: Good  EVALUATION COMPLEXITY: Moderate   GOALS: Goals reviewed with patient? Yes  SHORT TERM GOALS: Target date: 4th OT Rx visit  SHORT TERM GOALS: Target date: 4th OT Rx visit   Pt will demonstrate understanding of lymphedema precautions and prevention strategies with modified independence using a printed reference to identify at least 5 precautions and discussing how s/he may implement them into daily life to reduce risk of progression with extra time. Baseline:Max A Goal status: INITIAL   Pt will be able to apply multilayer, knee length, gradient, compression wraps to one leg at a time with  modified assistance (extra time and assistive device/s) to decrease limb volume, to limit infection risk, and to limit lymphedema progression.  Baseline: Dependent Goal status: INITIAL  LONG TERM GOALS: Target date: 02/08/23  Given this patient's Intake score of TBA /100% on the functional outcomes FOTO tool, patient will experience an increase in function of 5 points to improve basic and instrumental ADLs performance, including lymphedema self-care.  Baseline: Max A Goal status: INITIAL   Given this patient's Intake score of TBA% on the Lymphedema Life Impact Scale (LLIS), patient will experience a reduction of at least 5 points in her perceived level of functional impairment resulting from lymphedema to improve functional performance and quality of life (QOL). Baseline: 64.71% Goal status: INITIAL  3 Pt will achieve at least a 10% volume reduction in B legs to return limb to typical size and shape, to limit infection risk and LE progression, to decrease pain, to improve function. Baseline: Dependent Goal status: INITIAL  4.  Pt will obtain proper compression garments/devices and achieve modified independence (extra time + assistive devices) with donning/doffing to optimize limb volume reductions and limit LE  progression over time. Baseline:  Goal status: INITIAL  5.  During Intensive phase CDT , with modified independence, Pt will achieve at least 85% compliance with all lymphedema self-care home program components, including daily skin care, compression wraps and /or garments, simple self MLD and lymphatic pumping therex to habituate LE self care protocol  into ADLs for optimal LE self-management over time. Baseline: Dependent Goal status: INITIAL   PLAN:  OT FREQUENCY: 2x/week  OT DURATION: 12 weeks  PLANNED INTERVENTIONS: 97110-Therapeutic exercises, 97530- Therapeutic activity, 97535- Self Care, 16109- Manual therapy, Patient/Family education, Manual lymph drainage, Compression  bandaging, and fit with appropriate compression garments  PLAN FOR NEXT SESSION:  Cont MLD Cont Pt edu for LE self-care  Loel Dubonnet, MS, OTR/L, CLT-LANA 01/19/23 4:04 PM '

## 2023-01-19 NOTE — Patient Instructions (Signed)

## 2023-01-19 NOTE — Progress Notes (Signed)
Karen [name] presents today for phlebotomy per MD orders. Phlebotomy procedure started at 1437 and ended at 1518. 400 grams removed via 20 gauge to left forearm after multiple attempts by different RNs.  Patient observed for 30 minutes after procedure without any incident. Patient tolerated procedure well. IV needle removed intact. Pt given post procedure snacks and drinks.

## 2023-01-20 ENCOUNTER — Ambulatory Visit: Payer: Commercial Managed Care - PPO | Admitting: Occupational Therapy

## 2023-01-20 DIAGNOSIS — I890 Lymphedema, not elsewhere classified: Secondary | ICD-10-CM | POA: Diagnosis not present

## 2023-01-20 NOTE — Therapy (Signed)
OUTPATIENT OCCUPATIONAL THERAPY TREATMENT NOTE  LOWER EXTREMITY LYMPHEDEMA  Patient Name: Karen [name] MRN: [id number] DOB:07-07-1970, 53 y.o., female Today's Date: 01/20/2023  END OF SESSION:   OT End of Session - 01/20/23 0816     Visit Number 15    Number of Visits 36    Date for OT Re-Evaluation 02/08/23    OT Start Time 0815    OT Stop Time 0900    OT Time Calculation (min) 45 min    Activity Tolerance Patient tolerated treatment well;No increased pain    Behavior During Therapy [facility] for tasks assessed/performed               Past Medical History:  Diagnosis Date   Adenomyosis    Anemia    Arthritis 2013   L knee gets steroid injections    Asthmatic bronchitis 01/31/2017   Back pain    Chronic constipation    Diarrhea    DVT of lower extremity (deep venous thrombosis) (HCC)    Dysmenorrhea    Endometriosis    Esophageal stricture    GERD (gastroesophageal reflux disease)    History of kidney stones    History of uterine fibroid    IBS (irritable bowel syndrome)    Interstitial cystitis    Lactose intolerance    Migraine    with aura   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   PONV (postoperative nausea and vomiting)    Recurrent upper respiratory infection (URI)    Sebaceous cyst of breast, right lower inner quadrant 10/25/2012   Excised 11/21/12    Sleep apnea    Syncope, non cardiac    Past Surgical History:  Procedure Laterality Date   ARTHROSCOPIC HAGLUNDS REPAIR     BREAST CYST EXCISION Right 11/21/2012   Procedure: CYST EXCISION BREAST;  Surgeon: Currie Paris, MD;  Location: [facility];  Service: General;  Laterality: Right;   BREAST CYST EXCISION Bilateral 01/18/2022   Procedure: EXCISION OF SEBACEOUS CYST BILATERAL BREAST;  Surgeon: Harriette Bouillon, MD;  Location: [facility];  Service: General;  Laterality: Bilateral;   BREAST EXCISIONAL BIOPSY Right 11/2012   CESAREAN SECTION  04/19/1993   CHOLECYSTECTOMY      COLONOSCOPY  05/02/2011   Procedure: COLONOSCOPY;  Surgeon: Vertell Novak., MD;  Location: [facility];  Service: Endoscopy;  Laterality: N/A;   colonoscopy  11/04/2014   DENTAL SURGERY  03/06/2018   2 surgeries ( 03/06/2018 and 04/06/2018)   to remove two separate benign tumors   ESOPHAGEAL MANOMETRY N/A 11/04/2015   Procedure: ESOPHAGEAL MANOMETRY (EM);  Surgeon: Carman Ching, MD;  Location: [facility];  Service: Endoscopy;  Laterality: N/A;   ESOPHAGOGASTRODUODENOSCOPY  05/02/2011   Procedure: ESOPHAGOGASTRODUODENOSCOPY (EGD);  Surgeon: Vertell Novak., MD;  Location: [facility];  Service: Endoscopy;  Laterality: N/A;   ESOPHAGOGASTRODUODENOSCOPY N/A 09/01/2014   Procedure: ESOPHAGOGASTRODUODENOSCOPY (EGD);  Surgeon: Carman Ching, MD;  Location: [facility];  Service: Endoscopy;  Laterality: N/A;   KNEE SURGERY     LAPAROSCOPY     x 4   LESION REMOVAL N/A 01/18/2022   Procedure: EXCISION OF SEBACEOUS CYSTS CHEST AND NECK;  Surgeon: Harriette Bouillon, MD;  Location:  [facility];  Service: General;  Laterality: N/A;   PH IMPEDANCE STUDY N/A 11/04/2015   Procedure: PH IMPEDANCE STUDY;  Surgeon: Carman Ching, MD;  Location: [facility];  Service: Endoscopy;  Laterality: N/A;   VAGINAL HYSTERECTOMY  2010   TVH--ovaries remain   Patient  Active Problem List   Diagnosis Date Noted   Sjogren syndrome with keratoconjunctivitis (HCC) 12/09/2022   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   Elevated LFTs 04/04/2021   Colitis 04/04/2021   Bacterial overgrowth syndrome 05/21/2020   Obesity 05/21/2020   History of endometriosis 05/21/2020   Constipation due to outlet dysfunction 02/05/2020   Gastroesophageal reflux disease 02/05/2020   Obstructive sleep apnea treated with continuous positive airway pressure (CPAP) 06/16/2017   Perennial allergic rhinitis with a nonallergic component 01/31/2017   Asthmatic bronchitis 01/31/2017   GI symptoms 01/31/2017   Family history of  colon cancer 01/27/2015   Chest pain 12/13/2012   Dyspnea 12/13/2012   DVT of lower extremity (deep venous thrombosis) (HCC) 01/03/1990    PCP: Coral Else, MD  REFERRING PROVIDER: Lorenda Ishihara, MD  REFERRING DIAG: I89.0  THERAPY DIAG:  Lymphedema, not elsewhere classified  Rationale for Evaluation and Treatment: Rehabilitation  ONSET DATE: ~2014; exacerbation in 2023  SUBJECTIVE:                                                                                                                                                                                           SUBJECTIVE STATEMENT: Karen [name] returns to OT for Rx visit for BLE/BLQ lymphedema care. Pt denies lymphedema related leg pain, but states she feels uncomfortable in digestive tract.. She reports phlebotomy unsuccessful at letting blood earlier in the week. Pt also reports new intermittent pain in L thigh.  PERTINENT HISTORY: CVI, OSA (no CPAP)  PAIN:  Are you having pain? Yes: NPRS scale: not rated/10 Pain location: BLE Pain description: tight, heavy, tingling, numbness Aggravating factors: standing, walking, dependent sitting > 30 minutes Relieving factors: elevation  PRECAUTIONS: Other: LYMPHEDEMA; Hx LLE DVT  RED FLAGS: Hx LLE DVT  WEIGHT BEARING RESTRICTIONS: No  FALLS:  Has patient fallen in last 6 months? No  LIVING ENVIRONMENT: Lives with: lives with spouse and daughter Lives in: House/apartment Stairs: Yes; Internal: 14 steps; yes and External: 4 steps; yes Has following equipment at home: None  OCCUPATION: [employer] operational officer, walking, desk work  LEISURE: family time  HAND DOMINANCE: right   PRIOR LEVEL OF FUNCTION: Independent  PATIENT GOALS: Feel better, be able to be more active without pain, wear preferred street shoes  OBJECTIVE: Note: Objective measures were completed at Evaluation unless otherwise noted.  COGNITION:  Overall cognitive status: Within functional limits for  tasks assessed   OBSERVATIONS / OTHER ASSESSMENTS:   POSTURE: WFL  LE ROM: WFL  LE MMT: WFL  LYMPHEDEMA ASSESSMENTS: non-Ca related  INFECTIONS: denies cellulitis and wound hx  BLE COMPARATIVE LIMB VOLUMETRICS Initial 11/15/22  LANDMARK RIGHT (  dominant)  R LEG (A-D) 3028.9 ml  R THIGH (E-G) 4809.60 ml  R FULL LIMB (A-G) 7838.5 ml  Limb Volume differential (LVD)  %  Volume change since initial %  Volume change overall V  (Blank rows = not tested)  LANDMARK LEFT  (Rx)  L LEG (A-D) 3189.9 ml  L THIGH (E-G) 4959.8 ml  L FULL LIMB (A-G) 8149.7 ml  Limb Volume differential (LVD)  LEG LVD = 5.32%, L>R; THIGH LVD = 3.12%, L>R; And full LLE LVD = 3.97%, L>R. 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 Obesity  Skin  Description Hyper-Keratosis Peau d' Orange Shiny Tight Fibrotic/ Indurated Fatty Doughy Spongy/ boggy       R>L x  x   Skin dry Flaky WNL Macerated   mildly      Color Redness Varicosities Blanching Hemosiderin Stain Mottled        x   Odor Malodorous Yeast Fungal infection  WNL      x   Temperature Warm Cool wnl     x    Pitting Edema   1+ 2+ 3+ 4+ Non-pitting         x   Girth Symmetrical Asymmetrical                   Distribution    L>R toes to groin    Stemmer Sign Positive Negative   +L -R   Lymphorrhea History Of:  Present Absent     x    Wounds History Of Present Absent Venous Arterial Pressure Sheer     x        Signs of Infection Redness Warmth Erythema Acute Swelling Drainage Borders                    Sensation Light Touch Deep pressure Hypersensitivity   In tact Impaired In tact Impaired Absent Impaired   x  x  x     Nails WNL   Fungus nail dystrophy   x     Hair Growth Symmetrical Asymmetrical   x    Skin Creases Base of toes  Ankles   Base of Fingers knees       Abdominal pannus Thigh Lobules   Face/neck   x           GAIT: Distance walked: >500' Assistive device utilized: None Level of assistance: Complete Independence Comments: Functional ambulation and transfers [facility]  LYMPHEDEMA LIFE IMPACT SCALE (LLIS): Initial 11/15/22  50%  FOTO functional outcome measure: TBA initial Rx visit 2/2 time constraints  TODAY'S TREATMENT:  MLD to BLE/BLQ, emphasis on colon and LLE Pt edu re lymphatic structure and function, and LE self care home program  PATIENT EDUCATION:  Continued Pt/ CG edu for how function of cisterna chyli, part of the thoracic duct of deep abdominal lymphatics, assists with digestion by filtering many of the fats and proteins from the digestive system. Sub optimal lymphatic flow in this region may result in constipation, bloating, swelling, etc. MLD helps mobilize these protein and fats , and the rhythmic movement of manual lymphatic drainage stimulates digestive muscles and increase peristalsis. Provided printed resource for reference.  Continued lymphedema self care home program training throughout session. Topics include outcome of comparative limb volumetrics- starting limb volume differentials (LVDs), technology and gradient techniques used for short stretch, multilayer compression wrapping, simple self-MLD, therapeutic lymphatic pumping exercises, skin/nail care, LE precautions,. compression garment recommendations and specifications, wear and care schedule and compression garment donning / doffing w assistive devices. Discussed progress towards all OT goals since commencing CDT. All questions answered to the Pt's satisfaction. Good return. Person educated: Patient  Education method: Explanation, Demonstration, and Handouts Education comprehension: verbalized understanding, returned demonstration, verbal cues required, and needs further  education  HOME EXERCISE PROGRAM: BLE lymphatic pumping there ex using- 1 sets of 10 reps, each exercise in order-  1-2 x daily, bilaterally Simple self MLD 1 x daily Daily skin care to increase hydration, skin mobility and decrease infection risk- can be done during MLD Compression wraps 23/7 during intensive phase of CDT Compression garments/ devices during self-management phase of CDT  ASSESSMENT:  CLINICAL IMPRESSION:  Karen [name] presents to OT for lymphedema care of the deep abdominal lymphatics and LLE swelling. Pt reports intestinal discomfort persists, but lymphatic massage is reducing discomfort and improving bowel motility and productivity. Continued MLD to ascending, transverse and descending colon in supine and side lying without increased pain. Pt performed diaphragmatic breathing throughout session. Pt has not yet ordered recommended compression garments. Cont as per POC.  11/15/22 Initial Evaluation: Karen [name] is a 53 y.o. female presenting with very mild, stage II, LLE lymphedema 2/2 venous insufficiency and obesity. L Leg swelling and associated pain swelling fluctuates. It has progressed over time, and now no longer resolves with elevation over night. RLE swelling limits balance during functional ambulation. It exacerbates infection and falls risk. LLE/RLQ lymphedema interferes with functional performance in all occupational domains, including basic and instrumental ADLs, productive activities, leisure pursuits and social participation. Pt will benefit from Occupational Therapy for Complete Decongestive Therapy (CDT) to restore function, to reduce physical and psychologic suffering associated with chronic, progressive lymphedema and associated pain, and to limit infection. CDT will include manual lymphatic drainage (MLD), skin care, therapeutic exercise and compression wraps and garment's devices. Without skilled OT for lymphedema care the condition will worsen and further  functional decline is expected  Custom-made gradient compression garments and HOS devices are medically necessary because they are uniquely sized and shaped to fit the exact dimensions of the affected extremities, and to provide appropriate medical grade, graduated compression essential for optimally managing chronic, progressive lymphedema. Multiple custom compression garments are needed to ensure proper hygiene to limit infection risk. Custom compression garments should be replaced q 3-6 months When worn consistently for optimal lipo-lymphedema self-management over time. HOS devices, medically necessary to limit fibrosis buildup in tissue, should be replaced q 2 years and PRN when worn out.      OBJECTIVE IMPAIRMENTS: decreased activity tolerance, decreased knowledge of condition, decreased knowledge of use of  DME, increased edema, impaired sensation, pain, and chronic, progressive leg swelling .   ACTIVITY LIMITATIONS: dependent sitting, extended standing and/ or walking, squatting, and lower body dressing, fitting preferred street shoes  PARTICIPATION LIMITATIONS: shopping, community activity, occupation, and yard work  PERSONAL FACTORS: 3+ comorbidities: Hx DVT,  OSA, CVI. Varicose veins  are also affecting patient's functional outcome.   REHAB POTENTIAL: Good  EVALUATION COMPLEXITY: Moderate   GOALS: Goals reviewed with patient? Yes  SHORT TERM GOALS: Target date: 4th OT Rx visit  SHORT TERM GOALS: Target date: 4th OT Rx visit   Pt will demonstrate understanding of lymphedema precautions and prevention strategies with modified independence using a printed reference to identify at least 5 precautions and discussing how s/he may implement them into daily life to reduce risk of progression with extra time. Baseline:Max A Goal status: INITIAL   Pt will be able to apply multilayer, knee length, gradient, compression wraps to one leg at a time with modified assistance (extra time and  assistive device/s) to decrease limb volume, to limit infection risk, and to limit lymphedema progression.  Baseline: Dependent Goal status: INITIAL  LONG TERM GOALS: Target date: 02/08/23  Given this patient's Intake score of TBA /100% on the functional outcomes FOTO tool, patient will experience an increase in function of 5 points to improve basic and instrumental ADLs performance, including lymphedema self-care.  Baseline: Max A Goal status: INITIAL   Given this patient's Intake score of TBA% on the Lymphedema Life Impact Scale (LLIS), patient will experience a reduction of at least 5 points in her perceived level of functional impairment resulting from lymphedema to improve functional performance and quality of life (QOL). Baseline: 64.71% Goal status: INITIAL  3 Pt will achieve at least a 10% volume reduction in B legs to return limb to typical size and shape, to limit infection risk and LE progression, to decrease pain, to improve function. Baseline: Dependent Goal status: INITIAL  4.  Pt will obtain proper compression garments/devices and achieve modified independence (extra time + assistive devices) with donning/doffing to optimize limb volume reductions and limit LE  progression over time. Baseline:  Goal status: INITIAL  5.  During Intensive phase CDT , with modified independence, Pt will achieve at least 85% compliance with all lymphedema self-care home program components, including daily skin care, compression wraps and /or garments, simple self MLD and lymphatic pumping therex to habituate LE self care protocol  into ADLs for optimal LE self-management over time. Baseline: Dependent Goal status: INITIAL   PLAN:  OT FREQUENCY: 2x/week  OT DURATION: 12 weeks  PLANNED INTERVENTIONS: 97110-Therapeutic exercises, 97530- Therapeutic activity, 97535- Self Care, 16109- Manual therapy, Patient/Family education, Manual lymph drainage, Compression bandaging, and fit with appropriate  compression garments  PLAN FOR NEXT SESSION:  Cont MLD Cont Pt edu for LE self-care  Loel Dubonnet, MS, OTR/L, CLT-LANA 01/20/23 12:29 PM '

## 2023-01-24 DIAGNOSIS — B999 Unspecified infectious disease: Secondary | ICD-10-CM | POA: Diagnosis not present

## 2023-01-25 ENCOUNTER — Other Ambulatory Visit (HOSPITAL_COMMUNITY): Payer: Self-pay

## 2023-01-26 ENCOUNTER — Ambulatory Visit: Payer: Commercial Managed Care - PPO | Admitting: Occupational Therapy

## 2023-01-26 ENCOUNTER — Telehealth (HOSPITAL_BASED_OUTPATIENT_CLINIC_OR_DEPARTMENT_OTHER): Payer: Self-pay | Admitting: *Deleted

## 2023-01-26 DIAGNOSIS — I890 Lymphedema, not elsewhere classified: Secondary | ICD-10-CM | POA: Diagnosis not present

## 2023-01-26 LAB — IGE: IgE (Immunoglobulin E), Serum: 279 [IU]/mL (ref 6–495)

## 2023-01-26 NOTE — Telephone Encounter (Signed)
Pt called and asked if Dr. Hyacinth Meeker has had a chance to reach out to Dr. Delanna Ahmadi to see about getting her in sooner. She reports that the irritation is continuing and is interfering with daily life. Advised that I would send her request to provider and get back to her with any recommendations.

## 2023-01-26 NOTE — Therapy (Signed)
OUTPATIENT OCCUPATIONAL THERAPY TREATMENT NOTE  LOWER EXTREMITY/ LOWER QUADRANT LYMPHEDEMA  Patient Name: Karen [name] MRN: [id number] DOB:June 08, 1970, 53 y.o., female Today's Date: 01/26/2023  END OF SESSION:   OT End of Session - 01/26/23 0912     Visit Number 16    Number of Visits 36    Date for OT Re-Evaluation 02/08/23    OT Start Time 0910    OT Stop Time 1006    OT Time Calculation (min) 56 min    Activity Tolerance Patient tolerated treatment well;No increased pain    Behavior During Therapy [facility] for tasks assessed/performed               Past Medical History:  Diagnosis Date   Adenomyosis    Anemia    Arthritis 2013   L knee gets steroid injections    Asthmatic bronchitis 01/31/2017   Back pain    Chronic constipation    Diarrhea    DVT of lower extremity (deep venous thrombosis) (HCC)    Dysmenorrhea    Endometriosis    Esophageal stricture    GERD (gastroesophageal reflux disease)    History of kidney stones    History of uterine fibroid    IBS (irritable bowel syndrome)    Interstitial cystitis    Lactose intolerance    Migraine    with aura   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   PONV (postoperative nausea and vomiting)    Recurrent upper respiratory infection (URI)    Sebaceous cyst of breast, right lower inner quadrant 10/25/2012   Excised 11/21/12    Sleep apnea    Syncope, non cardiac    Past Surgical History:  Procedure Laterality Date   ARTHROSCOPIC HAGLUNDS REPAIR     BREAST CYST EXCISION Right 11/21/2012   Procedure: CYST EXCISION BREAST;  Surgeon: Currie Paris, MD;  Location: [facility];  Service: General;  Laterality: Right;   BREAST CYST EXCISION Bilateral 01/18/2022   Procedure: EXCISION OF SEBACEOUS CYST BILATERAL BREAST;  Surgeon: Harriette Bouillon, MD;  Location: [facility];  Service: General;  Laterality: Bilateral;   BREAST EXCISIONAL BIOPSY Right 11/2012   CESAREAN SECTION  04/19/1993    CHOLECYSTECTOMY     COLONOSCOPY  05/02/2011   Procedure: COLONOSCOPY;  Surgeon: Vertell Novak., MD;  Location: [facility];  Service: Endoscopy;  Laterality: N/A;   colonoscopy  11/04/2014   DENTAL SURGERY  03/06/2018   2 surgeries ( 03/06/2018 and 04/06/2018)   to remove two separate benign tumors   ESOPHAGEAL MANOMETRY N/A 11/04/2015   Procedure: ESOPHAGEAL MANOMETRY (EM);  Surgeon: Carman Ching, MD;  Location: [facility];  Service: Endoscopy;  Laterality: N/A;   ESOPHAGOGASTRODUODENOSCOPY  05/02/2011   Procedure: ESOPHAGOGASTRODUODENOSCOPY (EGD);  Surgeon: Vertell Novak., MD;  Location: [facility];  Service: Endoscopy;  Laterality: N/A;   ESOPHAGOGASTRODUODENOSCOPY N/A 09/01/2014   Procedure: ESOPHAGOGASTRODUODENOSCOPY (EGD);  Surgeon: Carman Ching, MD;  Location: [facility];  Service: Endoscopy;  Laterality: N/A;   KNEE SURGERY     LAPAROSCOPY     x 4   LESION REMOVAL N/A 01/18/2022   Procedure: EXCISION OF SEBACEOUS CYSTS CHEST AND NECK;  Surgeon: Harriette Bouillon, MD;  Location: [facility];  Service: General;  Laterality: N/A;   PH IMPEDANCE STUDY N/A 11/04/2015   Procedure: PH IMPEDANCE STUDY;  Surgeon: Carman Ching, MD;  Location: [facility];  Service: Endoscopy;  Laterality: N/A;   VAGINAL HYSTERECTOMY  2010   TVH--ovaries remain  Patient Active Problem List   Diagnosis Date Noted   Sjogren syndrome with keratoconjunctivitis (HCC) 12/09/2022   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   Elevated LFTs 04/04/2021   Colitis 04/04/2021   Bacterial overgrowth syndrome 05/21/2020   Obesity 05/21/2020   History of endometriosis 05/21/2020   Constipation due to outlet dysfunction 02/05/2020   Gastroesophageal reflux disease 02/05/2020   Obstructive sleep apnea treated with continuous positive airway pressure (CPAP) 06/16/2017   Perennial allergic rhinitis with a nonallergic component 01/31/2017   Asthmatic bronchitis 01/31/2017   GI symptoms 01/31/2017    Family history of colon cancer 01/27/2015   Chest pain 12/13/2012   Dyspnea 12/13/2012   DVT of lower extremity (deep venous thrombosis) (HCC) 01/03/1990    PCP: Coral Else, MD  REFERRING PROVIDER: Lorenda Ishihara, MD  REFERRING DIAG: I89.0  THERAPY DIAG:  Lymphedema, not elsewhere classified  Rationale for Evaluation and Treatment: Rehabilitation  ONSET DATE: ~2014; exacerbation in 2023  SUBJECTIVE:                                                                                                                                                                                           SUBJECTIVE STATEMENT: Karen [name] returns to OT for Rx visit for BLE/BLQ lymphedema care. Pt reports aching in her legs, which may be a side effect from new auto immune medication, 3/10. Pt has no new complaints.  PERTINENT HISTORY: CVI, OSA (no CPAP)  PAIN:  Are you having pain? Yes: NPRS scale: not rated3/10 Pain location: BLE Pain description: myalgia, tight, heavy, tingling, numbness Aggravating factors: standing, walking, dependent sitting > 30 minutes Relieving factors: elevation  PRECAUTIONS: Other: LYMPHEDEMA; Hx LLE DVT  RED FLAGS: Hx LLE DVT  WEIGHT BEARING RESTRICTIONS: No  FALLS:  Has patient fallen in last 6 months? No  LIVING ENVIRONMENT: Lives with: lives with spouse and daughter Lives in: House/apartment Stairs: Yes; Internal: 14 steps; yes and External: 4 steps; yes Has following equipment at home: None  OCCUPATION: [employer] Services operational officer, walking, desk work  LEISURE: family time  HAND DOMINANCE: right   PRIOR LEVEL OF FUNCTION: Independent  PATIENT GOALS: Feel better, be able to be more active without pain, wear preferred street shoes  OBJECTIVE: Note: Objective measures were completed at Evaluation unless otherwise noted.  COGNITION:  Overall cognitive status: Within functional limits for tasks assessed   OBSERVATIONS / OTHER ASSESSMENTS:   POSTURE:  WFL  LE ROM: WFL  LE MMT: WFL  LYMPHEDEMA ASSESSMENTS: non-Ca related  INFECTIONS: denies cellulitis and wound hx  BLE COMPARATIVE LIMB VOLUMETRICS Initial 11/15/22  LANDMARK RIGHT (dominant)  R LEG (A-D) 3028.9 ml  R  THIGH (E-G) 4809.60 ml  R FULL LIMB (A-G) 7838.5 ml  Limb Volume differential (LVD)  %  Volume change since initial %  Volume change overall V  (Blank rows = not tested)  LANDMARK LEFT  (Rx)  L LEG (A-D) 3189.9 ml  L THIGH (E-G) 4959.8 ml  L FULL LIMB (A-G) 8149.7 ml  Limb Volume differential (LVD)  LEG LVD = 5.32%, L>R; THIGH LVD = 3.12%, L>R; And full LLE LVD = 3.97%, L>R. 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 Obesity  Skin  Description Hyper-Keratosis Peau d' Orange Shiny Tight Fibrotic/ Indurated Fatty Doughy Spongy/ boggy       R>L x  x   Skin dry Flaky WNL Macerated   mildly      Color Redness Varicosities Blanching Hemosiderin Stain Mottled        x   Odor Malodorous Yeast Fungal infection  WNL      x   Temperature Warm Cool wnl     x    Pitting Edema   1+ 2+ 3+ 4+ Non-pitting         x   Girth Symmetrical Asymmetrical                   Distribution    L>R toes to groin    Stemmer Sign Positive Negative   +L -R   Lymphorrhea History Of:  Present Absent     x    Wounds History Of Present Absent Venous Arterial Pressure Sheer     x        Signs of Infection Redness Warmth Erythema Acute Swelling Drainage Borders                    Sensation Light Touch Deep pressure Hypersensitivity   In tact Impaired In tact Impaired Absent Impaired   x  x  x     Nails WNL   Fungus nail dystrophy   x     Hair Growth Symmetrical Asymmetrical   x    Skin Creases Base of toes  Ankles   Base of Fingers knees       Abdominal pannus Thigh Lobules  Face/neck   x           GAIT: Distance walked:  >500' Assistive device utilized: None Level of assistance: Complete Independence Comments: Functional ambulation and transfers [facility]  LYMPHEDEMA LIFE IMPACT SCALE (LLIS): Initial 11/15/22  50%  FOTO functional outcome measure: TBA initial Rx visit 2/2 time constraints  TODAY'S TREATMENT:                                                                                                                                         MLD to BLE/BLQ, emphasis on  colon and LLE Pt edu re lymphatic structure and function, and LE self care home program  PATIENT EDUCATION:  Continued Pt/ CG edu for how function of cisterna chyli, part of the thoracic duct of deep abdominal lymphatics, assists with digestion by filtering many of the fats and proteins from the digestive system. Sub optimal lymphatic flow in this region may result in constipation, bloating, swelling, etc. MLD helps mobilize these protein and fats , and the rhythmic movement of manual lymphatic drainage stimulates digestive muscles and increase peristalsis. Provided printed resource for reference.  Continued lymphedema self care home program training throughout session. Topics include outcome of comparative limb volumetrics- starting limb volume differentials (LVDs), technology and gradient techniques used for short stretch, multilayer compression wrapping, simple self-MLD, therapeutic lymphatic pumping exercises, skin/nail care, LE precautions,. compression garment recommendations and specifications, wear and care schedule and compression garment donning / doffing w assistive devices. Discussed progress towards all OT goals since commencing CDT. All questions answered to the Pt's satisfaction. Good return. Person educated: Patient  Education method: Explanation, Demonstration, and Handouts Education comprehension: verbalized understanding, returned demonstration, verbal cues required, and needs further education  HOME EXERCISE PROGRAM: BLE lymphatic  pumping there ex using- 1 sets of 10 reps, each exercise in order-  1-2 x daily, bilaterally Simple self MLD 1 x daily Daily skin care to increase hydration, skin mobility and decrease infection risk- can be done during MLD Compression wraps 23/7 during intensive phase of CDT Compression garments/ devices during self-management phase of CDT  ASSESSMENT:  CLINICAL IMPRESSION:  Emphasis of manual therapy on deep, lower quadrant lymphatics today. Pt reports intestinal discomfort persists, but lymphatic massage continues to lessen discomfort and improve bowel motility. Continued MLD to intestinal lymphatics in supine and side lying without increased pain. Pt performed diaphragmatic breathing throughout session. Pt has not yet ordered recommended compression garments. Cont as per POC.  11/15/22 Initial Evaluation: [place] is a 53 y.o. female presenting with very mild, stage II, LLE lymphedema 2/2 venous insufficiency and obesity. L Leg swelling and associated pain swelling fluctuates. It has progressed over time, and now no longer resolves with elevation over night. RLE swelling limits balance during functional ambulation. It exacerbates infection and falls risk. LLE/RLQ lymphedema interferes with functional performance in all occupational domains, including basic and instrumental ADLs, productive activities, leisure pursuits and social participation. Pt will benefit from Occupational Therapy for Complete Decongestive Therapy (CDT) to restore function, to reduce physical and psychologic suffering associated with chronic, progressive lymphedema and associated pain, and to limit infection. CDT will include manual lymphatic drainage (MLD), skin care, therapeutic exercise and compression wraps and garment's devices. Without skilled OT for lymphedema care the condition will worsen and further functional decline is expected  Custom-made gradient compression garments and HOS devices are medically necessary  because they are uniquely sized and shaped to fit the exact dimensions of the affected extremities, and to provide appropriate medical grade, graduated compression essential for optimally managing chronic, progressive lymphedema. Multiple custom compression garments are needed to ensure proper hygiene to limit infection risk. Custom compression garments should be replaced q 3-6 months When worn consistently for optimal lipo-lymphedema self-management over time. HOS devices, medically necessary to limit fibrosis buildup in tissue, should be replaced q 2 years and PRN when worn out.      OBJECTIVE IMPAIRMENTS: decreased activity tolerance, decreased knowledge of condition, decreased knowledge of use of DME, increased edema, impaired sensation, pain, and chronic, progressive leg swelling .   ACTIVITY  LIMITATIONS: dependent sitting, extended standing and/ or walking, squatting, and lower body dressing, fitting preferred street shoes  PARTICIPATION LIMITATIONS: shopping, community activity, occupation, and yard work  PERSONAL FACTORS: 3+ comorbidities: Hx DVT,  OSA, CVI. Varicose veins  are also affecting patient's functional outcome.   REHAB POTENTIAL: Good  EVALUATION COMPLEXITY: Moderate   GOALS: Goals reviewed with patient? Yes  SHORT TERM GOALS: Target date: 4th OT Rx visit  SHORT TERM GOALS: Target date: 4th OT Rx visit   Pt will demonstrate understanding of lymphedema precautions and prevention strategies with modified independence using a printed reference to identify at least 5 precautions and discussing how s/he may implement them into daily life to reduce risk of progression with extra time. Baseline:Max A Goal status: INITIAL   Pt will be able to apply multilayer, knee length, gradient, compression wraps to one leg at a time with modified assistance (extra time and assistive device/s) to decrease limb volume, to limit infection risk, and to limit lymphedema progression.  Baseline:  Dependent Goal status: INITIAL  LONG TERM GOALS: Target date: 02/08/23  Given this patient's Intake score of TBA /100% on the functional outcomes FOTO tool, patient will experience an increase in function of 5 points to improve basic and instrumental ADLs performance, including lymphedema self-care.  Baseline: Max A Goal status: INITIAL   Given this patient's Intake score of TBA% on the Lymphedema Life Impact Scale (LLIS), patient will experience a reduction of at least 5 points in her perceived level of functional impairment resulting from lymphedema to improve functional performance and quality of life (QOL). Baseline: 64.71% Goal status: INITIAL  3 Pt will achieve at least a 10% volume reduction in B legs to return limb to typical size and shape, to limit infection risk and LE progression, to decrease pain, to improve function. Baseline: Dependent Goal status: INITIAL  4.  Pt will obtain proper compression garments/devices and achieve modified independence (extra time + assistive devices) with donning/doffing to optimize limb volume reductions and limit LE  progression over time. Baseline:  Goal status: INITIAL  5.  During Intensive phase CDT , with modified independence, Pt will achieve at least 85% compliance with all lymphedema self-care home program components, including daily skin care, compression wraps and /or garments, simple self MLD and lymphatic pumping therex to habituate LE self care protocol  into ADLs for optimal LE self-management over time. Baseline: Dependent Goal status: INITIAL   PLAN:  OT FREQUENCY: 2x/week  OT DURATION: 12 weeks  PLANNED INTERVENTIONS: 97110-Therapeutic exercises, 97530- Therapeutic activity, 97535- Self Care, [id number]- Manual therapy, Patient/Family education, Manual lymph drainage, Compression bandaging, and fit with appropriate compression garments  PLAN FOR NEXT SESSION:  Cont MLD Cont Pt edu for LE self-care  Loel Dubonnet, MS, OTR/L,  CLT-LANA 01/26/23 10:47 AM '

## 2023-01-27 ENCOUNTER — Ambulatory Visit: Payer: Commercial Managed Care - PPO | Admitting: Occupational Therapy

## 2023-01-27 ENCOUNTER — Encounter: Payer: Self-pay | Admitting: Occupational Therapy

## 2023-01-27 DIAGNOSIS — I890 Lymphedema, not elsewhere classified: Secondary | ICD-10-CM | POA: Diagnosis not present

## 2023-01-27 NOTE — Therapy (Signed)
OUTPATIENT OCCUPATIONAL THERAPY TREATMENT NOTE  LOWER EXTREMITY/ LOWER QUADRANT LYMPHEDEMA  Patient Name: Karen [name] MRN: [id number] DOB:08-29-1970, 53 y.o., female Today's Date: 01/27/2023  END OF SESSION:   OT End of Session - 01/27/23 0905     Visit Number 17    Number of Visits 36    Date for OT Re-Evaluation 02/08/23    OT Start Time 0901    OT Stop Time 1005    OT Time Calculation (min) 64 min    Activity Tolerance Patient tolerated treatment well;No increased pain    Behavior During Therapy [facility] for tasks assessed/performed               Past Medical History:  Diagnosis Date   Adenomyosis    Anemia    Arthritis 2013   L knee gets steroid injections    Asthmatic bronchitis 01/31/2017   Back pain    Chronic constipation    Diarrhea    DVT of lower extremity (deep venous thrombosis) (HCC)    Dysmenorrhea    Endometriosis    Esophageal stricture    GERD (gastroesophageal reflux disease)    History of kidney stones    History of uterine fibroid    IBS (irritable bowel syndrome)    Interstitial cystitis    Lactose intolerance    Migraine    with aura   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   PONV (postoperative nausea and vomiting)    Recurrent upper respiratory infection (URI)    Sebaceous cyst of breast, right lower inner quadrant 10/25/2012   Excised 11/21/12    Sleep apnea    Syncope, non cardiac    Past Surgical History:  Procedure Laterality Date   ARTHROSCOPIC HAGLUNDS REPAIR     BREAST CYST EXCISION Right 11/21/2012   Procedure: CYST EXCISION BREAST;  Surgeon: Currie Paris, MD;  Location: [facility];  Service: General;  Laterality: Right;   BREAST CYST EXCISION Bilateral 01/18/2022   Procedure: EXCISION OF SEBACEOUS CYST BILATERAL BREAST;  Surgeon: Harriette Bouillon, MD;  Location: [facility];  Service: General;  Laterality: Bilateral;   BREAST EXCISIONAL BIOPSY Right 11/2012   CESAREAN SECTION  04/19/1993    CHOLECYSTECTOMY     COLONOSCOPY  05/02/2011   Procedure: COLONOSCOPY;  Surgeon: Vertell Novak., MD;  Location: [facility];  Service: Endoscopy;  Laterality: N/A;   colonoscopy  11/04/2014   DENTAL SURGERY  03/06/2018   2 surgeries ( 03/06/2018 and 04/06/2018)   to remove two separate benign tumors   ESOPHAGEAL MANOMETRY N/A 11/04/2015   Procedure: ESOPHAGEAL MANOMETRY (EM);  Surgeon: Carman Ching, MD;  Location: [facility];  Service: Endoscopy;  Laterality: N/A;   ESOPHAGOGASTRODUODENOSCOPY  05/02/2011   Procedure: ESOPHAGOGASTRODUODENOSCOPY (EGD);  Surgeon: Vertell Novak., MD;  Location: [facility];  Service: Endoscopy;  Laterality: N/A;   ESOPHAGOGASTRODUODENOSCOPY N/A 09/01/2014   Procedure: ESOPHAGOGASTRODUODENOSCOPY (EGD);  Surgeon: Carman Ching, MD;  Location: [facility];  Service: Endoscopy;  Laterality: N/A;   KNEE SURGERY     LAPAROSCOPY     x 4   LESION REMOVAL N/A 01/18/2022   Procedure: EXCISION OF SEBACEOUS CYSTS CHEST AND NECK;  Surgeon: Harriette Bouillon, MD;  Location: [facility];  Service: General;  Laterality: N/A;   PH IMPEDANCE STUDY N/A 11/04/2015   Procedure: PH IMPEDANCE STUDY;  Surgeon: Carman Ching, MD;  Location: [facility];  Service: Endoscopy;  Laterality: N/A;   VAGINAL HYSTERECTOMY  2010   TVH--ovaries remain  Patient Active Problem List   Diagnosis Date Noted   Sjogren syndrome with keratoconjunctivitis (HCC) 12/09/2022   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   Elevated LFTs 04/04/2021   Colitis 04/04/2021   Bacterial overgrowth syndrome 05/21/2020   Obesity 05/21/2020   History of endometriosis 05/21/2020   Constipation due to outlet dysfunction 02/05/2020   Gastroesophageal reflux disease 02/05/2020   Obstructive sleep apnea treated with continuous positive airway pressure (CPAP) 06/16/2017   Perennial allergic rhinitis with a nonallergic component 01/31/2017   Asthmatic bronchitis 01/31/2017   GI symptoms 01/31/2017    Family history of colon cancer 01/27/2015   Chest pain 12/13/2012   Dyspnea 12/13/2012   DVT of lower extremity (deep venous thrombosis) (HCC) 01/03/1990    PCP: Coral Else, MD  REFERRING PROVIDER: Lorenda Ishihara, MD  REFERRING DIAG: I89.0  THERAPY DIAG:  Lymphedema, not elsewhere classified  Rationale for Evaluation and Treatment: Rehabilitation  ONSET DATE: ~2014; exacerbation in 2023  SUBJECTIVE:                                                                                                                                                                                           SUBJECTIVE STATEMENT: Charisse returns to OT for Rx visit for BLE/BLQ lymphedema care. Pt reports aching in her legs, which may be a side effect from new auto immune medication, 2/10. Pt has no new complaints.  PERTINENT HISTORY: CVI, OSA (no CPAP)  PAIN:  Are you having pain? Yes: NPRS scale: not rated3/10 Pain location: BLE Pain description: myalgia, tight, heavy, tingling, numbness Aggravating factors: standing, walking, dependent sitting > 30 minutes Relieving factors: elevation  PRECAUTIONS: Other: LYMPHEDEMA; Hx LLE DVT  RED FLAGS: Hx LLE DVT  WEIGHT BEARING RESTRICTIONS: No  FALLS:  Has patient fallen in last 6 months? No  LIVING ENVIRONMENT: Lives with: lives with spouse and daughter Lives in: House/apartment Stairs: Yes; Internal: 14 steps; yes and External: 4 steps; yes Has following equipment at home: None  OCCUPATION: Diplomatic Services operational officer, walking, desk work  LEISURE: family time  HAND DOMINANCE: right   PRIOR LEVEL OF FUNCTION: Independent  PATIENT GOALS: Feel better, be able to be more active without pain, wear preferred street shoes  OBJECTIVE: Note: Objective measures were completed at Evaluation unless otherwise noted.  COGNITION:  Overall cognitive status: Within functional limits for tasks assessed   OBSERVATIONS / OTHER ASSESSMENTS:   POSTURE:  WFL  LE ROM: WFL  LE MMT: WFL  LYMPHEDEMA ASSESSMENTS: non-Ca related  INFECTIONS: denies cellulitis and wound hx  BLE COMPARATIVE LIMB VOLUMETRICS Initial 11/15/22  LANDMARK RIGHT (dominant)  R LEG (A-D) 3028.9 ml  R  THIGH (E-G) 4809.60 ml  R FULL LIMB (A-G) 7838.5 ml  Limb Volume differential (LVD)  %  Volume change since initial %  Volume change overall V  (Blank rows = not tested)  LANDMARK LEFT  (Rx)  L LEG (A-D) 3189.9 ml  L THIGH (E-G) 4959.8 ml  L FULL LIMB (A-G) 8149.7 ml  Limb Volume differential (LVD)  LEG LVD = 5.32%, L>R; THIGH LVD = 3.12%, L>R; And full LLE LVD = 3.97%, L>R. 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 Obesity  Skin  Description Hyper-Keratosis Peau d' Orange Shiny Tight Fibrotic/ Indurated Fatty Doughy Spongy/ boggy       R>L x  x   Skin dry Flaky WNL Macerated   mildly      Color Redness Varicosities Blanching Hemosiderin Stain Mottled        x   Odor Malodorous Yeast Fungal infection  WNL      x   Temperature Warm Cool wnl     x    Pitting Edema   1+ 2+ 3+ 4+ Non-pitting         x   Girth Symmetrical Asymmetrical                   Distribution    L>R toes to groin    Stemmer Sign Positive Negative   +L -R   Lymphorrhea History Of:  Present Absent     x    Wounds History Of Present Absent Venous Arterial Pressure Sheer     x        Signs of Infection Redness Warmth Erythema Acute Swelling Drainage Borders                    Sensation Light Touch Deep pressure Hypersensitivity   In tact Impaired In tact Impaired Absent Impaired   x  x  x     Nails WNL   Fungus nail dystrophy   x     Hair Growth Symmetrical Asymmetrical   x    Skin Creases Base of toes  Ankles   Base of Fingers knees       Abdominal pannus Thigh Lobules  Face/neck   x           GAIT: Distance walked:  >500' Assistive device utilized: None Level of assistance: Complete Independence Comments: Functional ambulation and transfers [facility]  LYMPHEDEMA LIFE IMPACT SCALE (LLIS): Initial 11/15/22  50%  FOTO functional outcome measure: Deferred. FOTO discontinued.  TODAY'S TREATMENT:                                                                                                                                         MLD to BLE/BLQ, emphasis on colon and LLE Pt  edu re lymphatic structure and function, and LE self care home program  PATIENT EDUCATION:  Continued Pt/ CG edu for how function of cisterna chyli, part of the thoracic duct of deep abdominal lymphatics, assists with digestion by filtering many of the fats and proteins from the digestive system. Sub optimal lymphatic flow in this region may result in constipation, bloating, swelling, etc. MLD helps mobilize these protein and fats , and the rhythmic movement of manual lymphatic drainage stimulates digestive muscles and increase peristalsis. Provided printed resource for reference.  Continued lymphedema self care home program training throughout session. Topics include outcome of comparative limb volumetrics- starting limb volume differentials (LVDs), technology and gradient techniques used for short stretch, multilayer compression wrapping, simple self-MLD, therapeutic lymphatic pumping exercises, skin/nail care, LE precautions,. compression garment recommendations and specifications, wear and care schedule and compression garment donning / doffing w assistive devices. Discussed progress towards all OT goals since commencing CDT. All questions answered to the Pt's satisfaction. Good return. Person educated: Patient  Education method: Explanation, Demonstration, and Handouts Education comprehension: verbalized understanding, returned demonstration, verbal cues required, and needs further education  HOME EXERCISE PROGRAM: BLE lymphatic pumping there  ex using- 1 sets of 10 reps, each exercise in order-  1-2 x daily, bilaterally Simple self MLD 1 x daily Daily skin care to increase hydration, skin mobility and decrease infection risk- can be done during MLD Compression wraps 23/7 during intensive phase of CDT Compression garments/ devices during self-management phase of CDT  ASSESSMENT:  CLINICAL IMPRESSION:  Emphasis of manual therapy on deep, lower quadrant lymphatics today. Pt reports intestinal discomfort persists, but lymphatic massage continues to lessen discomfort and improve bowel motility. Continued MLD to intestinal lymphatics in supine and side lying without increased pain. Pt performed diaphragmatic breathing throughout session. Pt has not yet ordered recommended compression garments. Cont as per POC.  11/15/22 Initial Evaluation: Boyd Litaker is a 53 y.o. female presenting with very mild, stage II, LLE lymphedema 2/2 venous insufficiency and obesity. L Leg swelling and associated pain swelling fluctuates. It has progressed over time, and now no longer resolves with elevation over night. RLE swelling limits balance during functional ambulation. It exacerbates infection and falls risk. LLE/RLQ lymphedema interferes with functional performance in all occupational domains, including basic and instrumental ADLs, productive activities, leisure pursuits and social participation. Pt will benefit from Occupational Therapy for Complete Decongestive Therapy (CDT) to restore function, to reduce physical and psychologic suffering associated with chronic, progressive lymphedema and associated pain, and to limit infection. CDT will include manual lymphatic drainage (MLD), skin care, therapeutic exercise and compression wraps and garment's devices. Without skilled OT for lymphedema care the condition will worsen and further functional decline is expected  Custom-made gradient compression garments and HOS devices are medically necessary because they are  uniquely sized and shaped to fit the exact dimensions of the affected extremities, and to provide appropriate medical grade, graduated compression essential for optimally managing chronic, progressive lymphedema. Multiple custom compression garments are needed to ensure proper hygiene to limit infection risk. Custom compression garments should be replaced q 3-6 months When worn consistently for optimal lipo-lymphedema self-management over time. HOS devices, medically necessary to limit fibrosis buildup in tissue, should be replaced q 2 years and PRN when worn out.      OBJECTIVE IMPAIRMENTS: decreased activity tolerance, decreased knowledge of condition, decreased knowledge of use of DME, increased edema, impaired sensation, pain, and chronic, progressive leg swelling .   ACTIVITY LIMITATIONS: dependent sitting, extended  standing and/ or walking, squatting, and lower body dressing, fitting preferred street shoes  PARTICIPATION LIMITATIONS: shopping, community activity, occupation, and yard work  PERSONAL FACTORS: 3+ comorbidities: Hx DVT,  OSA, CVI. Varicose veins  are also affecting patient's functional outcome.   REHAB POTENTIAL: Good  EVALUATION COMPLEXITY: Moderate   GOALS: Goals reviewed with patient? Yes  SHORT TERM GOALS: Target date: 4th OT Rx visit  SHORT TERM GOALS: Target date: 4th OT Rx visit   Pt will demonstrate understanding of lymphedema precautions and prevention strategies with modified independence using a printed reference to identify at least 5 precautions and discussing how s/he may implement them into daily life to reduce risk of progression with extra time. Baseline:Max A Goal status: INITIAL   Pt will be able to apply multilayer, knee length, gradient, compression wraps to one leg at a time with modified assistance (extra time and assistive device/s) to decrease limb volume, to limit infection risk, and to limit lymphedema progression.  Baseline: Dependent Goal  status: INITIAL  LONG TERM GOALS: Target date: 02/08/23  Given this patient's Intake score of TBA /100% on the functional outcomes FOTO tool, patient will experience an increase in function of 5 points to improve basic and instrumental ADLs performance, including lymphedema self-care.  Baseline: Max A Goal status: INITIAL   Given this patient's Intake score of TBA% on the Lymphedema Life Impact Scale (LLIS), patient will experience a reduction of at least 5 points in her perceived level of functional impairment resulting from lymphedema to improve functional performance and quality of life (QOL). Baseline: 64.71% Goal status: INITIAL  3 Pt will achieve at least a 10% volume reduction in B legs to return limb to typical size and shape, to limit infection risk and LE progression, to decrease pain, to improve function. Baseline: Dependent Goal status: INITIAL  4.  Pt will obtain proper compression garments/devices and achieve modified independence (extra time + assistive devices) with donning/doffing to optimize limb volume reductions and limit LE  progression over time. Baseline:  Goal status: INITIAL  5.  During Intensive phase CDT , with modified independence, Pt will achieve at least 85% compliance with all lymphedema self-care home program components, including daily skin care, compression wraps and /or garments, simple self MLD and lymphatic pumping therex to habituate LE self care protocol  into ADLs for optimal LE self-management over time. Baseline: Dependent Goal status: INITIAL   PLAN:  OT FREQUENCY: 2x/week  OT DURATION: 12 weeks  PLANNED INTERVENTIONS: 97110-Therapeutic exercises, 97530- Therapeutic activity, 97535- Self Care, 10272- Manual therapy, Patient/Family education, Manual lymph drainage, Compression bandaging, and fit with appropriate compression garments  PLAN FOR NEXT SESSION:  Cont MLD Cont Pt edu for LE self-care  Loel Dubonnet, MS, OTR/L,  CLT-LANA 01/27/23 1:12 PM '

## 2023-01-29 LAB — STREP PNEUMONIAE 23 SEROTYPES IGG
Pneumo Ab Type 1*: 1.4 ug/mL (ref >1.3)
Pneumo Ab Type 12 (12F)*: 2.5 ug/mL (ref >1.3)
Pneumo Ab Type 14*: >30.6 ug/mL (ref >1.3)
Pneumo Ab Type 17 (17F)*: 17.9 ug/mL (ref >1.3)
Pneumo Ab Type 19 (19F)*: 4.3 ug/mL (ref >1.3)
Pneumo Ab Type 2*: 21.4 ug/mL (ref >1.3)
Pneumo Ab Type 20*: 12.8 ug/mL (ref >1.3)
Pneumo Ab Type 22 (22F)*: 0.6 ug/mL — ABNORMAL LOW (ref >1.3)
Pneumo Ab Type 23 (23F)*: >31.2 ug/mL (ref >1.3)
Pneumo Ab Type 26 (6B)*: 1.8 ug/mL (ref >1.3)
Pneumo Ab Type 3*: 0.2 ug/mL — ABNORMAL LOW (ref >1.3)
Pneumo Ab Type 34 (10A)*: 20.9 ug/mL (ref >1.3)
Pneumo Ab Type 4*: 1.8 ug/mL (ref >1.3)
Pneumo Ab Type 43 (11A)*: >11.6 ug/mL (ref >1.3)
Pneumo Ab Type 5*: 31.3 ug/mL (ref >1.3)
Pneumo Ab Type 51 (7F)*: 4.7 ug/mL (ref >1.3)
Pneumo Ab Type 54 (15B)*: 5.1 ug/mL (ref >1.3)
Pneumo Ab Type 56 (18C)*: 11.8 ug/mL (ref >1.3)
Pneumo Ab Type 57 (19A)*: 3.6 ug/mL (ref >1.3)
Pneumo Ab Type 68 (9V)*: 7.6 ug/mL (ref >1.3)
Pneumo Ab Type 70 (33F)*: 6 ug/mL (ref >1.3)
Pneumo Ab Type 8*: 13.2 ug/mL (ref >1.3)
Pneumo Ab Type 9 (9N)*: >37 ug/mL (ref >1.3)

## 2023-01-30 ENCOUNTER — Encounter: Payer: Self-pay | Admitting: Dermatology

## 2023-01-30 ENCOUNTER — Ambulatory Visit (INDEPENDENT_AMBULATORY_CARE_PROVIDER_SITE_OTHER): Payer: Commercial Managed Care - PPO | Admitting: Dermatology

## 2023-01-30 ENCOUNTER — Other Ambulatory Visit (HOSPITAL_COMMUNITY): Payer: Self-pay

## 2023-01-30 ENCOUNTER — Ambulatory Visit: Payer: Self-pay

## 2023-01-30 VITALS — BP 142/95

## 2023-01-30 DIAGNOSIS — M5459 Other low back pain: Secondary | ICD-10-CM | POA: Diagnosis not present

## 2023-01-30 DIAGNOSIS — M62838 Other muscle spasm: Secondary | ICD-10-CM

## 2023-01-30 DIAGNOSIS — R102 Pelvic and perineal pain: Secondary | ICD-10-CM | POA: Diagnosis not present

## 2023-01-30 DIAGNOSIS — D235 Other benign neoplasm of skin of trunk: Secondary | ICD-10-CM

## 2023-01-30 DIAGNOSIS — L281 Prurigo nodularis: Secondary | ICD-10-CM

## 2023-01-30 DIAGNOSIS — R279 Unspecified lack of coordination: Secondary | ICD-10-CM

## 2023-01-30 DIAGNOSIS — D2371 Other benign neoplasm of skin of right lower limb, including hip: Secondary | ICD-10-CM | POA: Diagnosis not present

## 2023-01-30 DIAGNOSIS — D239 Other benign neoplasm of skin, unspecified: Secondary | ICD-10-CM

## 2023-01-30 DIAGNOSIS — L280 Lichen simplex chronicus: Secondary | ICD-10-CM

## 2023-01-30 DIAGNOSIS — M6281 Muscle weakness (generalized): Secondary | ICD-10-CM | POA: Diagnosis not present

## 2023-01-30 MED ORDER — NYSTATIN-TRIAMCINOLONE 100000-0.1 UNIT/GM-% EX OINT
1.0000 | TOPICAL_OINTMENT | Freq: Every day | CUTANEOUS | 0 refills | Status: DC
  Filled 2023-01-30: qty 30, 30d supply, fill #0

## 2023-01-30 NOTE — Progress Notes (Unsigned)
   New Patient Visit   Subjective  Karen [name] is a 53 y.o. female who presents for the following: New Pt - Spot Check  Patient states she has moles on the body &was Dx with lichen chronicus in the perianal area that she would like to have examined. Patient reports the areas have been there for 6 months. She reports the areas are bothersome.Patient rates irritation 8 out of 10. She states that the areas have not spread. Patient reports she has previously been treated for these areas by PCP. Rx triamcinolone & tacrolimus but she has D/C as it causing burning sensation in the area. She is only applying Aquaphor on the areas. Patient reports Hx of bx. Patient denies family history of skin cancer(s).   The following portions of the chart were reviewed this encounter and updated as appropriate: medications, allergies, medical history  Review of Systems:  No other skin or systemic complaints except as noted in HPI or Assessment and Plan.  Objective  Well appearing patient in no apparent distress; mood and affect are within normal limits.   A focused examination was performed of the following areas: perianal area   Relevant exam findings are noted in the Assessment and Plan.     Assessment & Plan   LICHEN SIMPLEX CHRONICUS/PRURIGO NODULARIS Exam: Excoriated lichenified papules and/or plaques at perianal area  flared  Lichen simplex chronicus (LSC) is a persistent itchy area of thickened skin that is induced by chronic rubbing and/or scratching (chronic dermatitis).  These areas may be pink, hyperpigmented and may have excoriations and bumps (prurigo nodules- PN).  LSC/PN is commonly observed in uncontrolled atopic dermatitis and other forms of eczema, and in other itchy skin conditions (eg, insect bites, scabies).  Sometimes it is not possible to know initial cause of LSC/PN if it has been present for a long time.  It generally responds well to treatment with high potency topical  steroids.  It is important to stop rubbing/scratching the area in order to break the itch-scratch-rash-itch cycle, in order for the rash to resolve.   Treatment Plan: - Rx Nystatin-Triamcinolone oint - once daily mix with [place] to use on affected areas - use for 2-3 weeks then stop. - Recommended using Water Wipes and D/C Cottonelle wipes. Pic included in AVS.  - Encouraged pt to try Epsom Salt baths.    DERMATOFIBROMA Exam: Firm pink/brown papulenodule with dimple sign located at the lower right leg and left breast. Treatment Plan: A dermatofibroma is a benign growth possibly related to trauma, such as an insect bite, cut from shaving, or inflamed acne-type bump.  Treatment options to remove include shave or excision with resulting scar and risk of recurrence.  Since benign-appearing and not bothersome, will observe for now.      No follow-ups on file.    Documentation: I have reviewed the above documentation for accuracy and completeness, and I agree with the above.   I, Sherrel Ploch Marcha Solders, CMA, am acting as scribe for [employer], DO.   Langston Reusing, DO

## 2023-01-30 NOTE — Patient Instructions (Addendum)
Hello Mr. Karen [name],  Thank you for visiting my office today. Your dedication to addressing and improving your skin health is greatly appreciated. Here is a summary of the key instructions from our discussion:  Medications and Treatments:   Discontinue Tacrolimus: Stop using tacrolimus due to irritation. New Prescription: Nystatin / Triamcinolone Ointment    Zinc Oxide and Ointment Mixture: Switch to plain zinc oxide for immediate relief. Mix with nystatin-triamcinolone ointment for application on affected areas.   Gentle Cleansing: Use Dove Unscented Bar Soap for gentle cleansing of irritated areas.   Epsom Salt Baths: Take baths using plain Epsom salt to soothe the skin. Apply the ointment mixture afterward.  Lifestyle Adjustments:   Replace Scented Wipes: Use water wipes instead of scented wipes to avoid further irritation.   Treatment Duration: If symptoms improve significantly within 2-3 weeks, you may stop using the topical treatments early. However, the buttocks area may require the full 3 weeks of treatment.  Follow-Up Care:   Next Appointment: Schedule a follow-up appointment in four weeks to assess progress and discuss further treatment as needed.   Full Body Check: We will perform a full body check during your next visit to monitor other skin concerns, including the removal of a larger skin tag.  Please follow these instructions carefully and reach out to the office if you have any questions or concerns. I look forward to seeing you at your follow-up appointment to ensure your skin conditions are improving.  Warm regards,  Dr. Langston Reusing [facility]     Important Information  Due to recent changes in healthcare laws, you may see results of your pathology and/or laboratory studies on MyChart before the doctors have had a chance to review them. We understand that in some cases there may be results that are confusing or concerning to you. Please understand that not all results  are received at the same time and often the doctors may need to interpret multiple results in order to provide you with the best plan of care or course of treatment. Therefore, we ask that you please give [place] 2 business days to thoroughly review all your results before contacting the office for clarification. Should we see a critical lab result, you will be contacted sooner.   If You Need Anything After Your Visit  If you have any questions or concerns for your doctor, please call our main line at [phone] If no one answers, please leave a voicemail as directed and we will return your call as soon as possible. Messages left after 4 pm will be answered the following business day.   You may also send [place] a message via MyChart. We typically respond to MyChart messages within 1-2 business days.  For prescription refills, please ask your pharmacy to contact our office. Our fax number is [phone].  If you have an urgent issue when the clinic is closed that cannot wait until the next business day, you can page your doctor at the number below.    Please note that while we do our best to be available for urgent issues outside of office hours, we are not available 24/7.   If you have an urgent issue and are unable to reach [place], you may choose to seek medical care at your doctor's office, retail clinic, urgent care center, or emergency room.  If you have a medical emergency, please immediately call 911 or go to the emergency department. In the event of inclement weather, please call our main line at  [phone] for an update on the status of any delays or closures.  Dermatology Medication Tips: Please keep the boxes that topical medications come in in order to help keep track of the instructions about where and how to use these. Pharmacies typically print the medication instructions only on the boxes and not directly on the medication tubes.   If your medication is too expensive, please contact our  office at [phone] or send [place] a message through MyChart.   We are unable to tell what your co-pay for medications will be in advance as this is different depending on your insurance coverage. However, we may be able to find a substitute medication at lower cost or fill out paperwork to get insurance to cover a needed medication.   If a prior authorization is required to get your medication covered by your insurance company, please allow [place] 1-2 business days to complete this process.  Drug prices often vary depending on where the prescription is filled and some pharmacies may offer cheaper prices.  The website www.goodrx.com contains coupons for medications through different pharmacies. The prices here do not account for what the cost may be with help from insurance (it may be cheaper with your insurance), but the website can give you the price if you did not use any insurance.  - You can print the associated coupon and take it with your prescription to the pharmacy.  - You may also stop by our office during regular business hours and pick up a GoodRx coupon card.  - If you need your prescription sent electronically to a different pharmacy, notify our office through [facility] or by phone at [phone]

## 2023-01-30 NOTE — Therapy (Signed)
OUTPATIENT PHYSICAL THERAPY FEMALE PELVIC TREATMENT   Patient Name: Karen [name] MRN: [id number] DOB:03-28-1970, 53 y.o., female Today's Date: 01/30/2023  END OF SESSION:  PT End of Session - 01/30/23 0952     Visit Number 27    Date for PT Re-Evaluation 04/26/23    Authorization Type [employer]    PT Start Time [phone]    PT Stop Time 0845    PT Time Calculation (min) 33 min    Activity Tolerance Patient tolerated treatment well    Behavior During Therapy [facility] for tasks assessed/performed                     Past Medical History:  Diagnosis Date   Adenomyosis    Anemia    Arthritis 2013   L knee gets steroid injections    Asthmatic bronchitis 01/31/2017   Back pain    Chronic constipation    Diarrhea    DVT of lower extremity (deep venous thrombosis) (HCC)    Dysmenorrhea    Endometriosis    Esophageal stricture    GERD (gastroesophageal reflux disease)    History of kidney stones    History of uterine fibroid    IBS (irritable bowel syndrome)    Interstitial cystitis    Lactose intolerance    Migraine    with aura   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   PONV (postoperative nausea and vomiting)    Recurrent upper respiratory infection (URI)    Sebaceous cyst of breast, right lower inner quadrant 10/25/2012   Excised 11/21/12    Sleep apnea    Syncope, non cardiac    Past Surgical History:  Procedure Laterality Date   ARTHROSCOPIC HAGLUNDS REPAIR     BREAST CYST EXCISION Right 11/21/2012   Procedure: CYST EXCISION BREAST;  Surgeon: Currie Paris, MD;  Location: [facility];  Service: General;  Laterality: Right;   BREAST CYST EXCISION Bilateral 01/18/2022   Procedure: EXCISION OF SEBACEOUS CYST BILATERAL BREAST;  Surgeon: Harriette Bouillon, MD;  Location: [facility];  Service: General;  Laterality: Bilateral;   BREAST EXCISIONAL BIOPSY Right 11/2012   CESAREAN SECTION  04/19/1993   CHOLECYSTECTOMY      COLONOSCOPY  05/02/2011   Procedure: COLONOSCOPY;  Surgeon: Vertell Novak., MD;  Location: [facility];  Service: Endoscopy;  Laterality: N/A;   colonoscopy  11/04/2014   DENTAL SURGERY  03/06/2018   2 surgeries ( 03/06/2018 and 04/06/2018)   to remove two separate benign tumors   ESOPHAGEAL MANOMETRY N/A 11/04/2015   Procedure: ESOPHAGEAL MANOMETRY (EM);  Surgeon: Carman Ching, MD;  Location: [facility];  Service: Endoscopy;  Laterality: N/A;   ESOPHAGOGASTRODUODENOSCOPY  05/02/2011   Procedure: ESOPHAGOGASTRODUODENOSCOPY (EGD);  Surgeon: Vertell Novak., MD;  Location: [facility];  Service: Endoscopy;  Laterality: N/A;   ESOPHAGOGASTRODUODENOSCOPY N/A 09/01/2014   Procedure: ESOPHAGOGASTRODUODENOSCOPY (EGD);  Surgeon: Carman Ching, MD;  Location: [facility];  Service: Endoscopy;  Laterality: N/A;   KNEE SURGERY     LAPAROSCOPY     x 4   LESION REMOVAL N/A 01/18/2022   Procedure: EXCISION OF SEBACEOUS CYSTS CHEST AND NECK;  Surgeon: Harriette Bouillon, MD;  Location: [facility];  Service: General;  Laterality: N/A;   PH IMPEDANCE STUDY N/A 11/04/2015   Procedure: PH IMPEDANCE STUDY;  Surgeon: Carman Ching, MD;  Location: [facility];  Service: Endoscopy;  Laterality: N/A;   VAGINAL HYSTERECTOMY  2010   TVH--ovaries remain  Patient Active Problem List   Diagnosis Date Noted   Sjogren syndrome with keratoconjunctivitis (HCC) 12/09/2022   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   Elevated LFTs 04/04/2021   Colitis 04/04/2021   Bacterial overgrowth syndrome 05/21/2020   Obesity 05/21/2020   History of endometriosis 05/21/2020   Constipation due to outlet dysfunction 02/05/2020   Gastroesophageal reflux disease 02/05/2020   Obstructive sleep apnea treated with continuous positive airway pressure (CPAP) 06/16/2017   Perennial allergic rhinitis with a nonallergic component 01/31/2017   Asthmatic bronchitis 01/31/2017   GI symptoms 01/31/2017   Family history of  colon cancer 01/27/2015   Chest pain 12/13/2012   Dyspnea 12/13/2012   DVT of lower extremity (deep venous thrombosis) (HCC) 01/03/1990    PCP: Lorenda Ishihara, MD  REFERRING PROVIDER: Jerene Bears, MD   REFERRING DIAG: M62.89 (ICD-10-CM) - Pelvic floor dysfunction  THERAPY DIAG:  Pelvic pain  Other low back pain  Muscle weakness (generalized)  Other muscle spasm  Unspecified lack of coordination  Rationale for Evaluation and Treatment: Rehabilitation  ONSET DATE: chronic  SUBJECTIVE:                                                                                                                                                                                           SUBJECTIVE STATEMENT:  Pt had cystoscopy performed without notable findings other than IC, which she has known, and has been diagnosed with overactive bladder syndrome. She has started plaquinel for autoimmune disease and was told that she should see improvement after a month or 2. She feels like bowel mobilization and lymph drainage are very helpful for more productive bowel movements. She has not taken any miralax and feels like this has helped with the diarrhea. She plans to do a cleanse this weekend. She feels like urinary urgency is getting a little better.  PAIN:  Are you having pain? Yes NPRS scale: 3/10 Pain location:  Lower abdominal pain, Lt sided pain, low back pain  Pain type: turning, knotted, pressure Pain description: intermittent and constant   Aggravating factors: constipation or frequent bowel movements/constantly having bowel movements  Relieving factors: exercises (bowel massage, happy baby, spinal twists, walking)  PRECAUTIONS: None  WEIGHT BEARING RESTRICTIONS: No  FALLS:  Has patient fallen in last 6 months? No  LIVING ENVIRONMENT: Lives with: lives with their spouse Lives in: House/apartment  OCCUPATION: nurse, in admin  PLOF: Independent  PATIENT GOALS: decrease  pain and have more normal bowel movements  PERTINENT HISTORY:  Vaginal hysterectomy, DVT LE, endometriosis, interstitial cystitis, exploratory laps for endometriosis, c-section, cholecystectomy Sexual abuse: No  BOWEL MOVEMENT: Pain with bowel movement: Yes  Type of bowel movement:Type (Bristol Stool Scale) 1-7 (sometimes full range in the same bowel movement), Frequency sometimes many times a day, sometimes up to 4 days in between, and Strain Yes Fully empty rectum: No Leakage: No Pads: No Fiber supplement: No - has attempted metamucil and it made constipation worse  URINATION: Pain with urination: Yes Fully empty bladder: No Stream:  varies  Urgency: Yes: all the time Frequency: multiple times an hours  Leakage: Urge to void, Walking to the bathroom, Coughing, Sneezing, Laughing, and Exercise Pads: Yes: daily  INTERCOURSE: Pain with intercourse: Initial Penetration and During Penetration Ability to have vaginal penetration:  Yes: with pain Climax: non painful WNL   PREGNANCY: Vaginal deliveries 1 Tearing Yes: 3rd degree tear C-section deliveries 1 Currently pregnant No  PROLAPSE: Periodically will feel vaginal bulging, heaviness in lower abdomen   OBJECTIVE:  11/09/22: No internal rectal or vaginal pelvic floor exam performed due to high level of skin irritation   Weak transversus abdominus contraction  Abdominal restriction and tenderness in bil lower quadrant  Unable to perform pelvic tilt with appropriate coordination  LUMBARAROM/PROM:  A/PROM A/PROM  Eval (% available) 11/09/22 (% available)  Flexion 50 90  Extension 25, pain anteriorly  25, pain in low back  Right lateral flexion 50, pain anteriorly  75, pain on right  Left lateral flexion 50, pain anteriorly  75, pain on right   (Blank rows = not tested) 07/21/22: standing prolapse assessment demonstrated grade 2 anterior vaginal wall laxity   06/08/22:               External Perineal Exam: WNL                               Internal Pelvic Floor: burning reported with palpation of superficial muscles; deep aching/pressure with palpation of Lt levator ani   Patient confirms identification and approves PT to assess internal pelvic floor and treatment Yes  PELVIC MMT:   MMT eval  Vaginal 3/5  Internal Anal Sphincter 1/5  External Anal Sphincter 2/5  Puborectalis 1/5  Diastasis Recti   (Blank rows = not tested)        TONE: High in Lt levator ani   PROLAPSE: Not able to tell this session due to dyssynergic pelvic floor contraction     06/01/22: COGNITION: Overall cognitive status: Within functional limits for tasks assessed     SENSATION: Light touch: Appears intact Proprioception: Appears intact  GAIT: Comments: decreased hip extension, forward flexed trunk  POSTURE: rounded shoulders, forward head, decreased lumbar lordosis, increased thoracic kyphosis, posterior pelvic tilt, and flexed trunk    LUMBARAROM/PROM:  A/PROM A/PROM  Eval (% available)  Flexion 50  Extension 25, pain anteriorly   Right lateral flexion 50, pain anteriorly   Left lateral flexion 50, pain anteriorly    (Blank rows = not tested)   PALPATION:   General  Significant abdominal restriction and tenderness; decreased rib mobility with mobilization and breathing    TODAY'S TREATMENT:  DATE:  01/30/23 Manual: Abdominal soft tissue mobilization  Bowel mobilization Ileocecal valve myofascial release  01/18/23 Manual: Abdominal soft tissue mobilization  Bowel mobilization Ileocecal valve myofascial release Exercises: Seated forward fold 15 breaths Lateral side body stretch 10 breaths bil Open books 10x bil Lower trunk rotation wide feet 10x   01/11/23 Manual: Abdominal soft tissue mobilization  Bowel mobilization Ileocecal valve myofascial release Neuromuscular  re-education Diaphragmatic breathing/abdominal muscle relaxation   PATIENT EDUCATION:  Education details: See above Person educated: Patient Education method: Explanation, Demonstration, Tactile cues, Verbal cues, and Handouts Education comprehension: verbalized understanding  HOME EXERCISE PROGRAM: [id number]  ASSESSMENT:  CLINICAL IMPRESSION: Pt doing ok this week. She has been having more complete bowel movements without using Miralax and with the Manual lymph drainage and bowel mobilization. She does still feel like she has a stool burden and plans to do a cleanse this week. She is concerned that new medication for autoimmune disease also has the side effect of constipation and would like to get ahead of it. She tolerated all manual techniques to abdomen well today. There is notable restriction at ileocecal valve that believe will be helpful to continue myofascial release to. We will plan to perform some core strengthening exercises next session to further mobilize abdomen and help address some of over active bladder symptoms. She will continue to benefit from skilled PT intervention in order to decrease pain, improve bowel movements, and decrease urinary urgency/incontinence.     OBJECTIVE IMPAIRMENTS: decreased activity tolerance, decreased coordination, decreased endurance, decreased mobility, decreased strength, increased fascial restrictions, increased muscle spasms, impaired tone, postural dysfunction, and pain.   ACTIVITY LIMITATIONS: lifting, bending, continence, and locomotion level  PARTICIPATION LIMITATIONS: interpersonal relationship, community activity, and occupation  PERSONAL FACTORS: 1 comorbidity: medical history  are also affecting patient's functional outcome.   REHAB POTENTIAL: Good  CLINICAL DECISION MAKING: Stable/uncomplicated  EVALUATION COMPLEXITY: Low   GOALS: Goals reviewed with patient? Yes  SHORT TERM GOALS: Target date: 07/06/22 - updated 07/12/22 -  updated 08/18/22 - updated 09/22/22 - updated 10/26/22 - updated 11/09/22 - updated 12/15/22 updated 01/18/23  Pt will be independent with HEP.   Baseline: Goal status: MET 07/12/22  2.  Pt will be independent with diaphragmatic breathing and down training activities in order to improve pelvic floor relaxation.  Baseline:  Goal status: MET 07/12/22  3.  Pt will be independent with the knack, urge suppression technique, and double voiding in order to improve bladder habits and decrease urinary incontinence.   Baseline:  Goal status: MET 09/22/22  4.  Pt will be independent with use of squatty potty, relaxed toileting mechanics, and improved bowel movement techniques in order to increase ease of bowel movements and complete evacuation.   Baseline:  Goal status: MET 11/09/22  5.  Pt will be able to correctly perform diaphragmatic breathing and appropriate pressure management in order to prevent worsening vaginal wall laxity and improve pelvic floor A/ROM.   Baseline:  Goal status: MET 01/18/23  LONG TERM GOALS: Target date: 11/16/2022 - updated 07/12/22 - updated 08/18/22 - updated 09/22/22 - updated 10/26/22 - updated 11/09/22 - updated 12/15/22 - updated 01/18/23  Pt will be independent with advanced HEP.   Baseline:  Goal status: IN PROGRESS 01/18/23  2.  Pt will demonstrate normal pelvic floor muscle tone and A/ROM, able to achieve 4/5 strength with contractions and 10 sec endurance, in order to provide appropriate lumbopelvic support in functional activities.   Baseline:  Goal status: IN PROGRESS 12/15/22  3.  Pt will increase all impaired lumbar A/ROM by 25% without pain.  Baseline:  Goal status:  IN PROGRESS 12/15/22  4.  Pt will report pain no higher than 3/10 with any activity. Baseline:  Goal status:  IN PROGRESS 12/15/22  5.  Pt will report 0/10 pain with vaginal penetration in order to improve intimate relationship with partner.    Baseline:  Goal status:  IN PROGRESS  12/15/22  6.  Pt will be able to go 2-3 hours in between voids without urgency or incontinence in order to improve QOL and perform all functional activities with less difficulty.   Baseline:  Goal status: MET 01/18/23  7.  Pt will report no leaks with laughing, coughing, sneezing in order to improve comfort with interpersonal relationships and community activities.   Baseline:  Goal status:  IN PROGRESS 01/18/23  8.  Pt will have consistent bowel movements 4-5x/week without straining or pain.  Baseline:  Goal status:  MET 01/18/23  PLAN:  PT FREQUENCY: 1-2x/week  PT DURATION: 6 months  PLANNED INTERVENTIONS: Therapeutic exercises, Therapeutic activity, Neuromuscular re-education, Balance training, Gait training, Patient/Family education, Self Care, Joint mobilization, Dry Needling, Biofeedback, and Manual therapy  PLAN FOR NEXT SESSION: Continue to work on motor control activities surrounding pelvis; progress mobility; manual techniques as needed for comfort and to increase circulation.   Julio Alm, PT, DPT01/27/2512:09 PM

## 2023-01-31 ENCOUNTER — Encounter: Payer: Self-pay | Admitting: Allergy & Immunology

## 2023-01-31 NOTE — Telephone Encounter (Signed)
Advised pt that Dr.Miller was told that the patient could call frequently to check for cancellations to be provided with a sooner appt. Advised pt that she could try taking gabapentin. Pt states that she has tried that in the past and it only made her sleepy, did not help her symptoms. Pt reports that she saw Dr. Onalee Hua with Dermatology yesterday and was given a regimen to try. She has follow up scheduled in 1 month. Pt will call our office if she has any additional questions or if any other needs arise.

## 2023-02-01 ENCOUNTER — Encounter: Payer: Self-pay | Admitting: Allergy & Immunology

## 2023-02-01 ENCOUNTER — Encounter: Payer: Self-pay | Admitting: Occupational Therapy

## 2023-02-01 ENCOUNTER — Ambulatory Visit: Payer: Commercial Managed Care - PPO | Admitting: Occupational Therapy

## 2023-02-01 DIAGNOSIS — I890 Lymphedema, not elsewhere classified: Secondary | ICD-10-CM

## 2023-02-01 NOTE — Therapy (Signed)
OUTPATIENT OCCUPATIONAL THERAPY TREATMENT NOTE  LOWER EXTREMITY/ LOWER QUADRANT LYMPHEDEMA  Patient Name: Karen [name] MRN: [id number] DOB:[birth year], 53 y.o., female Today's Date: 02/01/2023  END OF SESSION:   OT End of Session - 02/01/23 1322     Visit Number 18    Number of Visits 36    Date for OT Re-Evaluation 02/08/23    OT Start Time 1006    OT Stop Time 1106    OT Time Calculation (min) 60 min    Activity Tolerance Patient tolerated treatment well;No increased pain    Behavior During Therapy [facility] for tasks assessed/performed               Past Medical History:  Diagnosis Date   Adenomyosis    Anemia    Arthritis 2013   L knee gets steroid injections    Asthmatic bronchitis 01/31/2017   Back pain    Chronic constipation    Diarrhea    DVT of lower extremity (deep venous thrombosis) (HCC)    Dysmenorrhea    Endometriosis    Esophageal stricture    GERD (gastroesophageal reflux disease)    History of kidney stones    History of uterine fibroid    IBS (irritable bowel syndrome)    Interstitial cystitis    Lactose intolerance    Migraine    with aura   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   PONV (postoperative nausea and vomiting)    Recurrent upper respiratory infection (URI)    Sebaceous cyst of breast, right lower inner quadrant 10/25/2012   Excised 11/21/12    Sleep apnea    Syncope, non cardiac    Past Surgical History:  Procedure Laterality Date   ARTHROSCOPIC HAGLUNDS REPAIR     BREAST CYST EXCISION Right 11/21/2012   Procedure: CYST EXCISION BREAST;  Surgeon: Currie Paris, MD;  Location: [facility];  Service: General;  Laterality: Right;   BREAST CYST EXCISION Bilateral 01/18/2022   Procedure: EXCISION OF SEBACEOUS CYST BILATERAL BREAST;  Surgeon: Harriette Bouillon, MD;  Location: [facility];  Service: General;  Laterality: Bilateral;   BREAST EXCISIONAL BIOPSY Right 11/2012   CESAREAN SECTION  04/19/1993    CHOLECYSTECTOMY     COLONOSCOPY  05/02/2011   Procedure: COLONOSCOPY;  Surgeon: Vertell Novak., MD;  Location: [facility];  Service: Endoscopy;  Laterality: N/A;   colonoscopy  11/04/2014   DENTAL SURGERY  03/06/2018   2 surgeries ( 03/06/2018 and 04/06/2018)   to remove two separate benign tumors   ESOPHAGEAL MANOMETRY N/A 11/04/2015   Procedure: ESOPHAGEAL MANOMETRY (EM);  Surgeon: Carman Ching, MD;  Location: [facility];  Service: Endoscopy;  Laterality: N/A;   ESOPHAGOGASTRODUODENOSCOPY  05/02/2011   Procedure: ESOPHAGOGASTRODUODENOSCOPY (EGD);  Surgeon: Vertell Novak., MD;  Location: [facility];  Service: Endoscopy;  Laterality: N/A;   ESOPHAGOGASTRODUODENOSCOPY N/A 09/01/2014   Procedure: ESOPHAGOGASTRODUODENOSCOPY (EGD);  Surgeon: Carman Ching, MD;  Location: [facility];  Service: Endoscopy;  Laterality: N/A;   KNEE SURGERY     LAPAROSCOPY     x 4   LESION REMOVAL N/A 01/18/2022   Procedure: EXCISION OF SEBACEOUS CYSTS CHEST AND NECK;  Surgeon: Harriette Bouillon, MD;  Location: [facility];  Service: General;  Laterality: N/A;   PH IMPEDANCE STUDY N/A 11/04/2015   Procedure: PH IMPEDANCE STUDY;  Surgeon: Carman Ching, MD;  Location: [facility];  Service: Endoscopy;  Laterality: N/A;   VAGINAL HYSTERECTOMY  2010   TVH--ovaries remain  Patient Active Problem List   Diagnosis Date Noted   Sjogren syndrome with keratoconjunctivitis (HCC) 12/09/2022   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   Elevated LFTs 04/04/2021   Colitis 04/04/2021   Bacterial overgrowth syndrome 05/21/2020   Obesity 05/21/2020   History of endometriosis 05/21/2020   Constipation due to outlet dysfunction 02/05/2020   Gastroesophageal reflux disease 02/05/2020   Obstructive sleep apnea treated with continuous positive airway pressure (CPAP) 06/16/2017   Perennial allergic rhinitis with a nonallergic component 01/31/2017   Asthmatic bronchitis 01/31/2017   GI symptoms 01/31/2017    Family history of colon cancer 01/27/2015   Chest pain 12/13/2012   Dyspnea 12/13/2012   DVT of lower extremity (deep venous thrombosis) (HCC) 01/03/1990    PCP: Coral Else, MD  REFERRING PROVIDER: Lorenda Ishihara, MD  REFERRING DIAG: I89.0  THERAPY DIAG:  Lymphedema, not elsewhere classified  Rationale for Evaluation and Treatment: Rehabilitation  ONSET DATE: ~2014; exacerbation in 2023  SUBJECTIVE:                                                                                                                                                                                           SUBJECTIVE STATEMENT: Alura returns to OT for Rx visit for BLE/BLQ lymphedema care. Pt denies LE related leg pain. She states deep abdoment MLD with diaphragmatic breathing gives relief from intestinal discomfort and has improved bow motility. Pt has no new complaints.   PERTINENT HISTORY: CVI, OSA (no CPAP)  PAIN:  Are you having pain? Yes: NPRS scale: not rated3/10 Pain location: BLE Pain description: myalgia, tight, heavy, tingling, numbness Aggravating factors: standing, walking, dependent sitting > 30 minutes Relieving factors: elevation  PRECAUTIONS: Other: LYMPHEDEMA; Hx LLE DVT  RED FLAGS: Hx LLE DVT  WEIGHT BEARING RESTRICTIONS: No  FALLS:  Has patient fallen in last 6 months? No  LIVING ENVIRONMENT: Lives with: lives with spouse and daughter Lives in: House/apartment Stairs: Yes; Internal: 14 steps; yes and External: 4 steps; yes Has following equipment at home: None  OCCUPATION: [employer] operational officer, walking, desk work  LEISURE: family time  HAND DOMINANCE: right   PRIOR LEVEL OF FUNCTION: Independent  PATIENT GOALS: Feel better, be able to be more active without pain, wear preferred street shoes  OBJECTIVE: Note: Objective measures were completed at Evaluation unless otherwise noted.  COGNITION:  Overall cognitive status: Within functional limits for  tasks assessed   OBSERVATIONS / OTHER ASSESSMENTS:   POSTURE: WFL  LE ROM: WFL  LE MMT: WFL  LYMPHEDEMA ASSESSMENTS: non-Ca related  INFECTIONS: denies cellulitis and wound hx  BLE COMPARATIVE LIMB VOLUMETRICS Initial 11/15/22  LANDMARK RIGHT (dominant)  R LEG (A-D) 3028.9 ml  R THIGH (E-G) 4809.60 ml  R FULL LIMB (A-G) 7838.5 ml  Limb Volume differential (LVD)  %  Volume change since initial %  Volume change overall V  (Blank rows = not tested)  LANDMARK LEFT  (Rx)  L LEG (A-D) 3189.9 ml  L THIGH (E-G) 4959.8 ml  L FULL LIMB (A-G) 8149.7 ml  Limb Volume differential (LVD)  LEG LVD = 5.32%, L>R; THIGH LVD = 3.12%, L>R; And full LLE LVD = 3.97%, L>R. 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 Obesity  Skin  Description Hyper-Keratosis Peau d' Orange Shiny Tight Fibrotic/ Indurated Fatty Doughy Spongy/ boggy       R>L x  x   Skin dry Flaky WNL Macerated   mildly      Color Redness Varicosities Blanching Hemosiderin Stain Mottled        x   Odor Malodorous Yeast Fungal infection  WNL      x   Temperature Warm Cool wnl     x    Pitting Edema   1+ 2+ 3+ 4+ Non-pitting         x   Girth Symmetrical Asymmetrical                   Distribution    L>R toes to groin    Stemmer Sign Positive Negative   +L -R   Lymphorrhea History Of:  Present Absent     x    Wounds History Of Present Absent Venous Arterial Pressure Sheer     x        Signs of Infection Redness Warmth Erythema Acute Swelling Drainage Borders                    Sensation Light Touch Deep pressure Hypersensitivity   In tact Impaired In tact Impaired Absent Impaired   x  x  x     Nails WNL   Fungus nail dystrophy   x     Hair Growth Symmetrical Asymmetrical   x    Skin Creases Base of toes  Ankles   Base of Fingers knees       Abdominal pannus Thigh Lobules   Face/neck   x           GAIT: Distance walked: >500' Assistive device utilized: None Level of assistance: Complete Independence Comments: Functional ambulation and transfers [facility]  LYMPHEDEMA LIFE IMPACT SCALE (LLIS): Initial 11/15/22  50%  FOTO functional outcome measure: Deferred. FOTO discontinued.  TODAY'S TREATMENT:                                                                                                                                         MLD to  BLE/BLQ, emphasis on colon and LLE Pt edu re lymphatic structure and function, and LE self care home program  PATIENT EDUCATION:  Continued Pt/ CG edu for how function of cisterna chyli, part of the thoracic duct of deep abdominal lymphatics, assists with digestion by filtering many of the fats and proteins from the digestive system. Sub optimal lymphatic flow in this region may result in constipation, bloating, swelling, etc. MLD helps mobilize these protein and fats , and the rhythmic movement of manual lymphatic drainage stimulates digestive muscles and increase peristalsis. Provided printed resource for reference.  Continued lymphedema self care home program training throughout session. Topics include outcome of comparative limb volumetrics- starting limb volume differentials (LVDs), technology and gradient techniques used for short stretch, multilayer compression wrapping, simple self-MLD, therapeutic lymphatic pumping exercises, skin/nail care, LE precautions,. compression garment recommendations and specifications, wear and care schedule and compression garment donning / doffing w assistive devices. Discussed progress towards all OT goals since commencing CDT. All questions answered to the Pt's satisfaction. Good return. Person educated: Patient  Education method: Explanation, Demonstration, and Handouts Education comprehension: verbalized understanding, returned demonstration, verbal cues required, and needs further  education  HOME EXERCISE PROGRAM: BLE lymphatic pumping there ex using- 1 sets of 10 reps, each exercise in order-  1-2 x daily, bilaterally Simple self MLD 1 x daily Daily skin care to increase hydration, skin mobility and decrease infection risk- can be done during MLD Compression wraps 23/7 during intensive phase of CDT Compression garments/ devices during self-management phase of CDT  ASSESSMENT:  CLINICAL IMPRESSION:  Emphasis of manual therapy on deep, lower quadrant lymphatics today. Pt reports intestinal discomfort persists, but lymphatic massage continues to lessen discomfort and improve bowel motility. Continued MLD to intestinal lymphatics in supine and side lying without increased pain. Pt performed diaphragmatic breathing throughout session. Pt has not yet ordered recommended compression garments. Cont as per POC.  11/15/22 Initial Evaluation: Wadie Mattie is a 53 y.o. female presenting with very mild, stage II, LLE lymphedema 2/2 venous insufficiency and obesity. L Leg swelling and associated pain swelling fluctuates. It has progressed over time, and now no longer resolves with elevation over night. RLE swelling limits balance during functional ambulation. It exacerbates infection and falls risk. LLE/RLQ lymphedema interferes with functional performance in all occupational domains, including basic and instrumental ADLs, productive activities, leisure pursuits and social participation. Pt will benefit from Occupational Therapy for Complete Decongestive Therapy (CDT) to restore function, to reduce physical and psychologic suffering associated with chronic, progressive lymphedema and associated pain, and to limit infection. CDT will include manual lymphatic drainage (MLD), skin care, therapeutic exercise and compression wraps and garment's devices. Without skilled OT for lymphedema care the condition will worsen and further functional decline is expected  Custom-made gradient compression  garments and HOS devices are medically necessary because they are uniquely sized and shaped to fit the exact dimensions of the affected extremities, and to provide appropriate medical grade, graduated compression essential for optimally managing chronic, progressive lymphedema. Multiple custom compression garments are needed to ensure proper hygiene to limit infection risk. Custom compression garments should be replaced q 3-6 months When worn consistently for optimal lipo-lymphedema self-management over time. HOS devices, medically necessary to limit fibrosis buildup in tissue, should be replaced q 2 years and PRN when worn out.      OBJECTIVE IMPAIRMENTS: decreased activity tolerance, decreased knowledge of condition, decreased knowledge of use of DME, increased edema, impaired sensation, pain, and chronic, progressive leg swelling .  ACTIVITY LIMITATIONS: dependent sitting, extended standing and/ or walking, squatting, and lower body dressing, fitting preferred street shoes  PARTICIPATION LIMITATIONS: shopping, community activity, occupation, and yard work  PERSONAL FACTORS: 3+ comorbidities: Hx DVT,  OSA, CVI. Varicose veins  are also affecting patient's functional outcome.   REHAB POTENTIAL: Good  EVALUATION COMPLEXITY: Moderate   GOALS: Goals reviewed with patient? Yes  SHORT TERM GOALS: Target date: 4th OT Rx visit  SHORT TERM GOALS: Target date: 4th OT Rx visit   Pt will demonstrate understanding of lymphedema precautions and prevention strategies with modified independence using a printed reference to identify at least 5 precautions and discussing how s/he may implement them into daily life to reduce risk of progression with extra time. Baseline:Max A Goal status: INITIAL   Pt will be able to apply multilayer, knee length, gradient, compression wraps to one leg at a time with modified assistance (extra time and assistive device/s) to decrease limb volume, to limit infection risk,  and to limit lymphedema progression.  Baseline: Dependent Goal status: INITIAL  LONG TERM GOALS: Target date: 02/08/23  Given this patient's Intake score of TBA /100% on the functional outcomes FOTO tool, patient will experience an increase in function of 5 points to improve basic and instrumental ADLs performance, including lymphedema self-care.  Baseline: Max A Goal status: INITIAL   Given this patient's Intake score of TBA% on the Lymphedema Life Impact Scale (LLIS), patient will experience a reduction of at least 5 points in her perceived level of functional impairment resulting from lymphedema to improve functional performance and quality of life (QOL). Baseline: 64.71% Goal status: INITIAL  3 Pt will achieve at least a 10% volume reduction in B legs to return limb to typical size and shape, to limit infection risk and LE progression, to decrease pain, to improve function. Baseline: Dependent Goal status: INITIAL  4.  Pt will obtain proper compression garments/devices and achieve modified independence (extra time + assistive devices) with donning/doffing to optimize limb volume reductions and limit LE  progression over time. Baseline:  Goal status: INITIAL  5.  During Intensive phase CDT , with modified independence, Pt will achieve at least 85% compliance with all lymphedema self-care home program components, including daily skin care, compression wraps and /or garments, simple self MLD and lymphatic pumping therex to habituate LE self care protocol  into ADLs for optimal LE self-management over time. Baseline: Dependent Goal status: INITIAL   PLAN:  OT FREQUENCY: 2x/week  OT DURATION: 12 weeks  PLANNED INTERVENTIONS: 97110-Therapeutic exercises, 97530- Therapeutic activity, 97535- Self Care, 81191- Manual therapy, Patient/Family education, Manual lymph drainage, Compression bandaging, and fit with appropriate compression garments  PLAN FOR NEXT SESSION:  Cont MLD Cont Pt edu  for LE self-care  Loel Dubonnet, MS, OTR/L, CLT-LANA 02/01/23 1:25 PM '

## 2023-02-02 ENCOUNTER — Ambulatory Visit: Payer: Commercial Managed Care - PPO | Admitting: Occupational Therapy

## 2023-02-02 DIAGNOSIS — I890 Lymphedema, not elsewhere classified: Secondary | ICD-10-CM | POA: Diagnosis not present

## 2023-02-02 NOTE — Therapy (Signed)
OUTPATIENT OCCUPATIONAL THERAPY TREATMENT NOTE  LOWER EXTREMITY/ LOWER QUADRANT LYMPHEDEMA  Patient Name: Karen [name] MRN: [id number] DOB:14-Sep-1970, 53 y.o., female Today's Date: 02/02/2023  END OF SESSION:   OT End of Session - 02/02/23 1415     Number of Visits 36    Date for OT Re-Evaluation 02/08/23    OT Start Time 0200    OT Stop Time 0300    OT Time Calculation (min) 60 min    Activity Tolerance Patient tolerated treatment well;No increased pain    Behavior During Therapy [facility] for tasks assessed/performed               Past Medical History:  Diagnosis Date   Adenomyosis    Anemia    Arthritis 2013   L knee gets steroid injections    Asthmatic bronchitis 01/31/2017   Back pain    Chronic constipation    Diarrhea    DVT of lower extremity (deep venous thrombosis) (HCC)    Dysmenorrhea    Endometriosis    Esophageal stricture    GERD (gastroesophageal reflux disease)    History of kidney stones    History of uterine fibroid    IBS (irritable bowel syndrome)    Interstitial cystitis    Lactose intolerance    Migraine    with aura   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   PONV (postoperative nausea and vomiting)    Recurrent upper respiratory infection (URI)    Sebaceous cyst of breast, right lower inner quadrant 10/25/2012   Excised 11/21/12    Sleep apnea    Syncope, non cardiac    Past Surgical History:  Procedure Laterality Date   ARTHROSCOPIC HAGLUNDS REPAIR     BREAST CYST EXCISION Right 11/21/2012   Procedure: CYST EXCISION BREAST;  Surgeon: Currie Paris, MD;  Location: [facility];  Service: General;  Laterality: Right;   BREAST CYST EXCISION Bilateral 01/18/2022   Procedure: EXCISION OF SEBACEOUS CYST BILATERAL BREAST;  Surgeon: Harriette Bouillon, MD;  Location: [facility];  Service: General;  Laterality: Bilateral;   BREAST EXCISIONAL BIOPSY Right 11/2012   CESAREAN SECTION  04/19/1993   CHOLECYSTECTOMY      COLONOSCOPY  05/02/2011   Procedure: COLONOSCOPY;  Surgeon: Vertell Novak., MD;  Location: [facility];  Service: Endoscopy;  Laterality: N/A;   colonoscopy  11/04/2014   DENTAL SURGERY  03/06/2018   2 surgeries ( 03/06/2018 and 04/06/2018)   to remove two separate benign tumors   ESOPHAGEAL MANOMETRY N/A 11/04/2015   Procedure: ESOPHAGEAL MANOMETRY (EM);  Surgeon: Carman Ching, MD;  Location: [facility];  Service: Endoscopy;  Laterality: N/A;   ESOPHAGOGASTRODUODENOSCOPY  05/02/2011   Procedure: ESOPHAGOGASTRODUODENOSCOPY (EGD);  Surgeon: Vertell Novak., MD;  Location: [facility];  Service: Endoscopy;  Laterality: N/A;   ESOPHAGOGASTRODUODENOSCOPY N/A 09/01/2014   Procedure: ESOPHAGOGASTRODUODENOSCOPY (EGD);  Surgeon: Carman Ching, MD;  Location: [facility];  Service: Endoscopy;  Laterality: N/A;   KNEE SURGERY     LAPAROSCOPY     x 4   LESION REMOVAL N/A 01/18/2022   Procedure: EXCISION OF SEBACEOUS CYSTS CHEST AND NECK;  Surgeon: Harriette Bouillon, MD;  Location: [facility];  Service: General;  Laterality: N/A;   PH IMPEDANCE STUDY N/A 11/04/2015   Procedure: PH IMPEDANCE STUDY;  Surgeon: Carman Ching, MD;  Location: [facility];  Service: Endoscopy;  Laterality: N/A;   VAGINAL HYSTERECTOMY  2010   TVH--ovaries remain   Patient Active Problem List  Diagnosis Date Noted   Sjogren syndrome with keratoconjunctivitis (HCC) 12/09/2022   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   Elevated LFTs 04/04/2021   Colitis 04/04/2021   Bacterial overgrowth syndrome 05/21/2020   Obesity 05/21/2020   History of endometriosis 05/21/2020   Constipation due to outlet dysfunction 02/05/2020   Gastroesophageal reflux disease 02/05/2020   Obstructive sleep apnea treated with continuous positive airway pressure (CPAP) 06/16/2017   Perennial allergic rhinitis with a nonallergic component 01/31/2017   Asthmatic bronchitis 01/31/2017   GI symptoms 01/31/2017   Family history of  colon cancer 01/27/2015   Chest pain 12/13/2012   Dyspnea 12/13/2012   DVT of lower extremity (deep venous thrombosis) (HCC) 01/03/1990    PCP: Coral Else, MD  REFERRING PROVIDER: Lorenda Ishihara, MD  REFERRING DIAG: I89.0  THERAPY DIAG:  Lymphedema, not elsewhere classified  Rationale for Evaluation and Treatment: Rehabilitation  ONSET DATE: ~2014; exacerbation in 2023  SUBJECTIVE:                                                                                                                                                                                           SUBJECTIVE STATEMENT: Karen [name] returns to OT for Rx visit for BLE/BLQ lymphedema care. Pt denies LE related leg pain. She states deep abdoment MLD with diaphragmatic breathing gives relief from intestinal discomfort and has improved bowl motility. Pt has no new complaints.   PERTINENT HISTORY: CVI, OSA (no CPAP)  PAIN:  Are you having pain? Yes: NPRS scale: not rated3/10 Pain location: BLE Pain description: myalgia, tight, heavy, tingling, numbness Aggravating factors: standing, walking, dependent sitting > 30 minutes Relieving factors: elevation  PRECAUTIONS: Other: LYMPHEDEMA; Hx LLE DVT  RED FLAGS: Hx LLE DVT  WEIGHT BEARING RESTRICTIONS: No  FALLS:  Has patient fallen in last 6 months? No  LIVING ENVIRONMENT: Lives with: lives with spouse and daughter Lives in: House/apartment Stairs: Yes; Internal: 14 steps; yes and External: 4 steps; yes Has following equipment at home: None  OCCUPATION: [employer] operational officer, walking, desk work  LEISURE: family time  HAND DOMINANCE: right   PRIOR LEVEL OF FUNCTION: Independent  PATIENT GOALS: Feel better, be able to be more active without pain, wear preferred street shoes  OBJECTIVE: Note: Objective measures were completed at Evaluation unless otherwise noted.  COGNITION:  Overall cognitive status: Within functional limits for tasks  assessed   OBSERVATIONS / OTHER ASSESSMENTS:   POSTURE: WFL  LE ROM: WFL  LE MMT: WFL  LYMPHEDEMA ASSESSMENTS: non-Ca related  INFECTIONS: denies cellulitis and wound hx  BLE COMPARATIVE LIMB VOLUMETRICS Initial 11/15/22  LANDMARK RIGHT (dominant)  R LEG (A-D) 3028.9 ml  R THIGH (E-G) 4809.60 ml  R FULL LIMB (A-G) 7838.5 ml  Limb Volume differential (LVD)  %  Volume change since initial %  Volume change overall V  (Blank rows = not tested)  LANDMARK LEFT  (Rx)  L LEG (A-D) 3189.9 ml  L THIGH (E-G) 4959.8 ml  L FULL LIMB (A-G) 8149.7 ml  Limb Volume differential (LVD)  LEG LVD = 5.32%, L>R; THIGH LVD = 3.12%, L>R; And full LLE LVD = 3.97%, L>R. 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 Obesity  Skin  Description Hyper-Keratosis Peau d' Orange Shiny Tight Fibrotic/ Indurated Fatty Doughy Spongy/ boggy       R>L x  x   Skin dry Flaky WNL Macerated   mildly      Color Redness Varicosities Blanching Hemosiderin Stain Mottled        x   Odor Malodorous Yeast Fungal infection  WNL      x   Temperature Warm Cool wnl     x    Pitting Edema   1+ 2+ 3+ 4+ Non-pitting         x   Girth Symmetrical Asymmetrical                   Distribution    L>R toes to groin    Stemmer Sign Positive Negative   +L -R   Lymphorrhea History Of:  Present Absent     x    Wounds History Of Present Absent Venous Arterial Pressure Sheer     x        Signs of Infection Redness Warmth Erythema Acute Swelling Drainage Borders                    Sensation Light Touch Deep pressure Hypersensitivity   In tact Impaired In tact Impaired Absent Impaired   x  x  x     Nails WNL   Fungus nail dystrophy   x     Hair Growth Symmetrical Asymmetrical   x    Skin Creases Base of toes  Ankles   Base of Fingers knees       Abdominal pannus Thigh Lobules   Face/neck   x           GAIT: Distance walked: >500' Assistive device utilized: None Level of assistance: Complete Independence Comments: Functional ambulation and transfers [facility]  LYMPHEDEMA LIFE IMPACT SCALE (LLIS): Initial 11/15/22  50%  FOTO functional outcome measure: Deferred. FOTO discontinued.  TODAY'S TREATMENT:                                                                                                                                         MLD to BLE/BLQ, emphasis on colon and LLE  Pt edu re lymphatic structure and function, and LE self care home program  PATIENT EDUCATION:  Continued Pt/ CG edu for how function of cisterna chyli, part of the thoracic duct of deep abdominal lymphatics, assists with digestion by filtering many of the fats and proteins from the digestive system. Sub optimal lymphatic flow in this region may result in constipation, bloating, swelling, etc. MLD helps mobilize these protein and fats , and the rhythmic movement of manual lymphatic drainage stimulates digestive muscles and increase peristalsis. Provided printed resource for reference.  Continued lymphedema self care home program training throughout session. Topics include outcome of comparative limb volumetrics- starting limb volume differentials (LVDs), technology and gradient techniques used for short stretch, multilayer compression wrapping, simple self-MLD, therapeutic lymphatic pumping exercises, skin/nail care, LE precautions,. compression garment recommendations and specifications, wear and care schedule and compression garment donning / doffing w assistive devices. Discussed progress towards all OT goals since commencing CDT. All questions answered to the Pt's satisfaction. Good return. Person educated: Patient  Education method: Explanation, Demonstration, and Handouts Education comprehension: verbalized understanding, returned demonstration, verbal cues required, and needs further  education  HOME EXERCISE PROGRAM: BLE lymphatic pumping there ex using- 1 sets of 10 reps, each exercise in order-  1-2 x daily, bilaterally Simple self MLD 1 x daily Daily skin care to increase hydration, skin mobility and decrease infection risk- can be done during MLD Compression wraps 23/7 during intensive phase of CDT Compression garments/ devices during self-management phase of CDT  ASSESSMENT:  CLINICAL IMPRESSION:  Emphasis of manual therapy on deep, lower quadrant lymphatics today. Pt reports intestinal discomfort persists, but lymphatic massage continues to lessen discomfort and improve bowel motility. Continued MLD to intestinal lymphatics in supine and side lying without increased pain. Pt performed diaphragmatic breathing throughout session. Pt has not yet ordered recommended compression garments. Cont as per POC.  11/15/22 Initial Evaluation: Karen [name] is a 53 y.o. female presenting with very mild, stage II, LLE lymphedema 2/2 venous insufficiency and obesity. L Leg swelling and associated pain swelling fluctuates. It has progressed over time, and now no longer resolves with elevation over night. RLE swelling limits balance during functional ambulation. It exacerbates infection and falls risk. LLE/RLQ lymphedema interferes with functional performance in all occupational domains, including basic and instrumental ADLs, productive activities, leisure pursuits and social participation. Pt will benefit from Occupational Therapy for Complete Decongestive Therapy (CDT) to restore function, to reduce physical and psychologic suffering associated with chronic, progressive lymphedema and associated pain, and to limit infection. CDT will include manual lymphatic drainage (MLD), skin care, therapeutic exercise and compression wraps and garment's devices. Without skilled OT for lymphedema care the condition will worsen and further functional decline is expected  Custom-made gradient compression  garments and HOS devices are medically necessary because they are uniquely sized and shaped to fit the exact dimensions of the affected extremities, and to provide appropriate medical grade, graduated compression essential for optimally managing chronic, progressive lymphedema. Multiple custom compression garments are needed to ensure proper hygiene to limit infection risk. Custom compression garments should be replaced q 3-6 months When worn consistently for optimal lipo-lymphedema self-management over time. HOS devices, medically necessary to limit fibrosis buildup in tissue, should be replaced q 2 years and PRN when worn out.      OBJECTIVE IMPAIRMENTS: decreased activity tolerance, decreased knowledge of condition, decreased knowledge of use of DME, increased edema, impaired sensation, pain, and chronic, progressive leg swelling .   ACTIVITY LIMITATIONS: dependent sitting,  extended standing and/ or walking, squatting, and lower body dressing, fitting preferred street shoes  PARTICIPATION LIMITATIONS: shopping, community activity, occupation, and yard work  PERSONAL FACTORS: 3+ comorbidities: Hx DVT,  OSA, CVI. Varicose veins  are also affecting patient's functional outcome.   REHAB POTENTIAL: Good  EVALUATION COMPLEXITY: Moderate   GOALS: Goals reviewed with patient? Yes  SHORT TERM GOALS: Target date: 4th OT Rx visit  SHORT TERM GOALS: Target date: 4th OT Rx visit   Pt will demonstrate understanding of lymphedema precautions and prevention strategies with modified independence using a printed reference to identify at least 5 precautions and discussing how s/he may implement them into daily life to reduce risk of progression with extra time. Baseline:Max A Goal status: INITIAL   Pt will be able to apply multilayer, knee length, gradient, compression wraps to one leg at a time with modified assistance (extra time and assistive device/s) to decrease limb volume, to limit infection risk,  and to limit lymphedema progression.  Baseline: Dependent Goal status: INITIAL  LONG TERM GOALS: Target date: 02/08/23  Given this patient's Intake score of TBA /100% on the functional outcomes FOTO tool, patient will experience an increase in function of 5 points to improve basic and instrumental ADLs performance, including lymphedema self-care.  Baseline: Max A Goal status: INITIAL   Given this patient's Intake score of TBA% on the Lymphedema Life Impact Scale (LLIS), patient will experience a reduction of at least 5 points in her perceived level of functional impairment resulting from lymphedema to improve functional performance and quality of life (QOL). Baseline: 64.71% Goal status: INITIAL  3 Pt will achieve at least a 10% volume reduction in B legs to return limb to typical size and shape, to limit infection risk and LE progression, to decrease pain, to improve function. Baseline: Dependent Goal status: INITIAL  4.  Pt will obtain proper compression garments/devices and achieve modified independence (extra time + assistive devices) with donning/doffing to optimize limb volume reductions and limit LE  progression over time. Baseline:  Goal status: INITIAL  5.  During Intensive phase CDT , with modified independence, Pt will achieve at least 85% compliance with all lymphedema self-care home program components, including daily skin care, compression wraps and /or garments, simple self MLD and lymphatic pumping therex to habituate LE self care protocol  into ADLs for optimal LE self-management over time. Baseline: Dependent Goal status: INITIAL   PLAN:  OT FREQUENCY: 2x/week  OT DURATION: 12 weeks  PLANNED INTERVENTIONS: 97110-Therapeutic exercises, 97530- Therapeutic activity, 97535- Self Care, 62130- Manual therapy, Patient/Family education, Manual lymph drainage, Compression bandaging, and fit with appropriate compression garments  PLAN FOR NEXT SESSION:  Cont MLD Cont Pt edu  for LE self-care  Loel Dubonnet, MS, OTR/L, CLT-LANA 02/02/23 4:03 PM '

## 2023-02-03 ENCOUNTER — Other Ambulatory Visit (HOSPITAL_COMMUNITY): Payer: Self-pay

## 2023-02-03 ENCOUNTER — Encounter: Payer: Self-pay | Admitting: Hematology & Oncology

## 2023-02-07 ENCOUNTER — Ambulatory Visit: Payer: Commercial Managed Care - PPO | Attending: Surgery | Admitting: Occupational Therapy

## 2023-02-07 ENCOUNTER — Ambulatory Visit: Payer: Commercial Managed Care - PPO | Attending: Obstetrics & Gynecology

## 2023-02-07 ENCOUNTER — Encounter: Payer: Self-pay | Admitting: Occupational Therapy

## 2023-02-07 DIAGNOSIS — M6281 Muscle weakness (generalized): Secondary | ICD-10-CM | POA: Insufficient documentation

## 2023-02-07 DIAGNOSIS — I890 Lymphedema, not elsewhere classified: Secondary | ICD-10-CM | POA: Diagnosis not present

## 2023-02-07 DIAGNOSIS — R102 Pelvic and perineal pain: Secondary | ICD-10-CM | POA: Diagnosis not present

## 2023-02-07 DIAGNOSIS — M5459 Other low back pain: Secondary | ICD-10-CM | POA: Diagnosis not present

## 2023-02-07 DIAGNOSIS — M62838 Other muscle spasm: Secondary | ICD-10-CM | POA: Insufficient documentation

## 2023-02-07 DIAGNOSIS — R279 Unspecified lack of coordination: Secondary | ICD-10-CM | POA: Insufficient documentation

## 2023-02-07 NOTE — Therapy (Signed)
 OUTPATIENT OCCUPATIONAL THERAPY PROGRESS REPORT AND TREATMENT NOTE  L LOWER EXTREMITY/ B LOWER QUADRANT LYMPHEDEMA  Patient Name: Karen [name] MRN: [id number] DOB:[birth year], 53 y.o., female Today's Date: 02/07/2023  REPORTING PERIOD: 12/22/22 - 02/07/23  END OF SESSION:   OT End of Session - 02/07/23 0815     Visit Number 20    Number of Visits 36    Date for OT Re-Evaluation 02/08/23    OT Start Time 0810    OT Stop Time 0910    OT Time Calculation (min) 60 min    Activity Tolerance Patient tolerated treatment well;No increased pain    Behavior During Therapy [facility] for tasks assessed/performed               Past Medical History:  Diagnosis Date   Adenomyosis    Anemia    Arthritis 2013   L knee gets steroid injections    Asthmatic bronchitis 01/31/2017   Back pain    Chronic constipation    Diarrhea    DVT of lower extremity (deep venous thrombosis) (HCC)    Dysmenorrhea    Endometriosis    Esophageal stricture    GERD (gastroesophageal reflux disease)    History of kidney stones    History of uterine fibroid    IBS (irritable bowel syndrome)    Interstitial cystitis    Lactose intolerance    Migraine    with aura   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   PONV (postoperative nausea and vomiting)    Recurrent upper respiratory infection (URI)    Sebaceous cyst of breast, right lower inner quadrant 10/25/2012   Excised 11/21/12    Sleep apnea    Syncope, non cardiac    Past Surgical History:  Procedure Laterality Date   ARTHROSCOPIC HAGLUNDS REPAIR     BREAST CYST EXCISION Right 11/21/2012   Procedure: CYST EXCISION BREAST;  Surgeon: Sherlean JINNY Laughter, MD;  Location: [facility];  Service: General;  Laterality: Right;   BREAST CYST EXCISION Bilateral 01/18/2022   Procedure: EXCISION OF SEBACEOUS CYST BILATERAL BREAST;  Surgeon: Vanderbilt Ned, MD;  Location: [facility];  Service: General;  Laterality: Bilateral;   BREAST  EXCISIONAL BIOPSY Right 11/2012   CESAREAN SECTION  04/19/1993   CHOLECYSTECTOMY     COLONOSCOPY  05/02/2011   Procedure: COLONOSCOPY;  Surgeon: Lynwood LITTIE Celestia Mickey., MD;  Location: [facility];  Service: Endoscopy;  Laterality: N/A;   colonoscopy  11/04/2014   DENTAL SURGERY  03/06/2018   2 surgeries ( 03/06/2018 and 04/06/2018)   to remove two separate benign tumors   ESOPHAGEAL MANOMETRY N/A 11/04/2015   Procedure: ESOPHAGEAL MANOMETRY (EM);  Surgeon: Lynwood Celestia, MD;  Location: [facility];  Service: Endoscopy;  Laterality: N/A;   ESOPHAGOGASTRODUODENOSCOPY  05/02/2011   Procedure: ESOPHAGOGASTRODUODENOSCOPY (EGD);  Surgeon: Lynwood LITTIE Celestia Mickey., MD;  Location: [facility];  Service: Endoscopy;  Laterality: N/A;   ESOPHAGOGASTRODUODENOSCOPY N/A 09/01/2014   Procedure: ESOPHAGOGASTRODUODENOSCOPY (EGD);  Surgeon: Lynwood Celestia, MD;  Location: [facility];  Service: Endoscopy;  Laterality: N/A;   KNEE SURGERY     LAPAROSCOPY     x 4   LESION REMOVAL N/A 01/18/2022   Procedure: EXCISION OF SEBACEOUS CYSTS CHEST AND NECK;  Surgeon: Vanderbilt Ned, MD;  Location: [facility];  Service: General;  Laterality: N/A;   PH IMPEDANCE STUDY N/A 11/04/2015   Procedure: PH IMPEDANCE STUDY;  Surgeon: Lynwood Celestia, MD;  Location: [facility];  Service: Endoscopy;  Laterality: N/A;  VAGINAL HYSTERECTOMY  2010   TVH--ovaries remain   Patient Active Problem List   Diagnosis Date Noted   Sjogren syndrome with keratoconjunctivitis (HCC) 12/09/2022   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   Elevated LFTs 04/04/2021   Colitis 04/04/2021   Bacterial overgrowth syndrome 05/21/2020   Obesity 05/21/2020   History of endometriosis 05/21/2020   Constipation due to outlet dysfunction 02/05/2020   Gastroesophageal reflux disease 02/05/2020   Obstructive sleep apnea treated with continuous positive airway pressure (CPAP) 06/16/2017   Perennial allergic rhinitis with a nonallergic component  01/31/2017   Asthmatic bronchitis 01/31/2017   GI symptoms 01/31/2017   Family history of colon cancer 01/27/2015   Chest pain 12/13/2012   Dyspnea 12/13/2012   DVT of lower extremity (deep venous thrombosis) (HCC) 01/03/1990    PCP: Gaile New, MD  REFERRING PROVIDER: Valery Ripple, MD  REFERRING DIAG: I89.0  THERAPY DIAG:  Lymphedema, not elsewhere classified  Rationale for Evaluation and Treatment: Rehabilitation  ONSET DATE: ~2014; exacerbation in 2023  SUBJECTIVE:                                                                                                                                                                                           SUBJECTIVE STATEMENT: Karen [name] returns to OT for Rx visit for BLE/BLQ lymphedema care. Pt reports pain in her legs is 2/10 this morning. She also reports back and R shoulder pain at 3/10.  She states deep abdoment MLD with diaphragmatic breathing gives relief from intestinal discomfort and has improved bowl motility. She states she has not needed a laxative in a few weeks now. She reports  that her legs still hurt, but she has not yet called DME vendor re ordering them. OT encouraged Pt to move forward with that goal in the next couple of weeks, if possible.   PERTINENT HISTORY: CVI, OSA (no CPAP), GERD, Esophageal stricture, IBS, Lactose intolerant, Diarrhea, Hx LE DVT  PAIN:  Are you having pain? Yes: NPRS scale: not rated3/10 Pain location: BLE Pain description: myalgia, tight, heavy, tingling, numbness Aggravating factors: standing, walking, dependent sitting > 30 minutes Relieving factors: elevation  PRECAUTIONS: Other: LYMPHEDEMA; Hx LLE DVT  RED FLAGS: Hx LLE DVT  WEIGHT BEARING RESTRICTIONS: No  FALLS:  Has patient fallen in last 6 months? No  LIVING ENVIRONMENT: Lives with: lives with spouse and daughter Lives in: House/apartment Stairs: Yes; Internal: 14 steps; yes and External: 4 steps; yes Has  following equipment at home: None  OCCUPATION: [employer] Services Operational Officer, walking, desk work  LEISURE: family time  HAND DOMINANCE: right   PRIOR LEVEL OF FUNCTION: Independent  PATIENT GOALS: Feel  better, be able to be more active without pain, wear preferred street shoes  OBJECTIVE: Note: Objective measures were completed at Evaluation unless otherwise noted.  COGNITION:  Overall cognitive status: Within functional limits for tasks assessed   OBSERVATIONS / OTHER ASSESSMENTS:   POSTURE: WFL  LE ROM: WFL  LE MMT: WFL  LYMPHEDEMA ASSESSMENTS: non-Ca related  INFECTIONS: denies cellulitis and wound hx  BLE COMPARATIVE LIMB VOLUMETRICS Initial 11/15/22  LANDMARK RIGHT (dominant)  R LEG (A-D) 3028.9 ml  R THIGH (E-G) 4809.60 ml  R FULL LIMB (A-G) 7838.5 ml  Limb Volume differential (LVD)  %  Volume change since initial %  Volume change overall V  (Blank rows = not tested)  LANDMARK LEFT  (Rx)  L LEG (A-D) 3189.9 ml  L THIGH (E-G) 4959.8 ml  L FULL LIMB (A-G) 8149.7 ml  Limb Volume differential (LVD)  LEG LVD = 5.32%, L>R; THIGH LVD = 3.12%, L>R; And full LLE LVD = 3.97%, L>R. 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 Obesity  Skin  Description Hyper-Keratosis Peau d' Orange Shiny Tight Fibrotic/ Indurated Fatty Doughy Spongy/ boggy       R>L x  x   Skin dry Flaky WNL Macerated   mildly      Color Redness Varicosities Blanching Hemosiderin Stain Mottled        x   Odor Malodorous Yeast Fungal infection  WNL      x   Temperature Warm Cool wnl     x    Pitting Edema   1+ 2+ 3+ 4+ Non-pitting         x   Girth Symmetrical Asymmetrical                   Distribution    L>R toes to groin    Stemmer Sign Positive Negative   +L -R   Lymphorrhea History Of:  Present Absent     x    Wounds History Of Present Absent Venous Arterial  Pressure Sheer     x        Signs of Infection Redness Warmth Erythema Acute Swelling Drainage Borders                    Sensation Light Touch Deep pressure Hypersensitivity   In tact Impaired In tact Impaired Absent Impaired   x  x  x     Nails WNL   Fungus nail dystrophy   x     Hair Growth Symmetrical Asymmetrical   x    Skin Creases Base of toes  Ankles   Base of Fingers knees       Abdominal pannus Thigh Lobules  Face/neck   x           GAIT: Distance walked: >500' Assistive device utilized: None Level of assistance: Complete Independence Comments: Functional ambulation and transfers [facility]  LYMPHEDEMA LIFE IMPACT SCALE (LLIS): Initial 11/15/22  50%  FOTO functional outcome measure: Deferred. FOTO discontinued.  TODAY'S TREATMENT:  Pt edu re lymphadema precautions and reviewed progress and obstacles to date. MLD to BLQ emphasis on colon and LLE  PATIENT EDUCATION:  Continued Pt/ CG edu for how function of cisterna chyli, part of the thoracic duct of deep abdominal lymphatics, assists with digestion by filtering many of the fats and proteins from the digestive system. Sub optimal lymphatic flow in this region may result in constipation, bloating, swelling, etc. MLD helps mobilize these protein and fats , and the rhythmic movement of manual lymphatic drainage stimulates digestive muscles and increase peristalsis. Provided printed resource for reference.  Continued lymphedema self care home program training throughout session. Topics include outcome of comparative limb volumetrics- starting limb volume differentials (LVDs), technology and gradient techniques used for short stretch, multilayer compression wrapping, simple self-MLD, therapeutic lymphatic pumping exercises, skin/nail care, LE precautions,. compression garment  recommendations and specifications, wear and care schedule and compression garment donning / doffing w assistive devices. Discussed progress towards all OT goals since commencing CDT. All questions answered to the Pt's satisfaction. Good return. Person educated: Patient  Education method: Explanation, Demonstration, and Handouts Education comprehension: verbalized understanding, returned demonstration, verbal cues required, and needs further education  HOME EXERCISE PROGRAM: BLE lymphatic pumping there ex using- 1 sets of 10 reps, each exercise in order-  1-2 x daily, bilaterally Simple self MLD 1 x daily Daily skin care to increase hydration, skin mobility and decrease infection risk- can be done during MLD Compression wraps 23/7 during intensive phase of CDT Compression garments/ devices during self-management phase of CDT  ASSESSMENT:  CLINICAL IMPRESSION:  Pt demonstrates steady progress towards all OT goals for CDT. Please see GOALS section below for specific details. We have shifted emphasis from lower extremity lymphedema care to deep abdominal lymphatics in an effort to reduce bloating sensation, abdominal swelling and intestinal discomfort . The abdominal area is the most lymphatic vessel rich area of the body where all vessels from the lower limbs, reproductive organs and digestive lymphatic vessels converge at the systerna chyli, a lage lymph structure that filters and sorts many of the prottiens and fats from the digestive process. Any sub optimal lymphatic flow may result in constipation, bloating and swelling. By report, since starting 2 x weekly abdominal MLD bowel motility has improved, and Pt has not needed OTC Miralax  for ~ 3-4 weeks.   Pt has not yet purchased recommended compression knee highs. OT urged her to call DME vendor today.  OT Performed abbreviated MLD session after goal review. Cont as per POC.  11/15/22 Initial Evaluation: Karen [name] is a 53 y.o. female  presenting with very mild, stage II, LLE lymphedema 2/2 venous insufficiency and obesity. L Leg swelling and associated pain swelling fluctuates. It has progressed over time, and now no longer resolves with elevation over night. RLE swelling limits balance during functional ambulation. It exacerbates infection and falls risk. LLE/RLQ lymphedema interferes with functional performance in all occupational domains, including basic and instrumental ADLs, productive activities, leisure pursuits and social participation. Pt will benefit from Occupational Therapy for Complete Decongestive Therapy (CDT) to restore function, to reduce physical and psychologic suffering associated with chronic, progressive lymphedema and associated pain, and to limit infection. CDT will include manual lymphatic drainage (MLD), skin care, therapeutic exercise and compression wraps and garment's devices. Without skilled OT for lymphedema care the condition will worsen and further functional decline is expected  Custom-made gradient compression garments and HOS devices are medically necessary because they are uniquely sized and shaped to fit the  exact dimensions of the affected extremities, and to provide appropriate medical grade, graduated compression essential for optimally managing chronic, progressive lymphedema. Multiple custom compression garments are needed to ensure proper hygiene to limit infection risk. Custom compression garments should be replaced q 3-6 months When worn consistently for optimal lipo-lymphedema self-management over time. HOS devices, medically necessary to limit fibrosis buildup in tissue, should be replaced q 2 years and PRN when worn out.      OBJECTIVE IMPAIRMENTS: decreased activity tolerance, decreased knowledge of condition, decreased knowledge of use of DME, increased edema, impaired sensation, pain, and chronic, progressive leg swelling .   ACTIVITY LIMITATIONS: dependent sitting, extended standing and/  or walking, squatting, and lower body dressing, fitting preferred street shoes  PARTICIPATION LIMITATIONS: shopping, community activity, occupation, and yard work  PERSONAL FACTORS: 3+ comorbidities: Hx DVT,  OSA, CVI. Varicose veins  are also affecting patient's functional outcome.   REHAB POTENTIAL: Good  EVALUATION COMPLEXITY: Moderate   GOALS: Goals reviewed with patient? Yes  SHORT TERM GOALS: Target date: 4th OT Rx visit  SHORT TERM GOALS: Target date: 4th OT Rx visit   Pt will demonstrate understanding of lymphedema precautions and prevention strategies with modified independence using a printed reference to identify at least 5 precautions and discussing how s/he may implement them into daily life to reduce risk of progression with extra time. Baseline:Max A Goal status: GOAL MET   Pt will be able to apply multilayer, knee length, gradient, compression wraps to one leg at a time with modified assistance (extra time and assistive device/s) to decrease limb volume, to limit infection risk, and to limit lymphedema progression.  Baseline: Dependent Goal status: GOAL DISCHARGED. Pt Going to compression garments instead  LONG TERM GOALS: Target date: 02/08/23  Given this patient's Intake score of 67%/100% on the functional outcomes FOTO tool, patient will experience an increase in function of 5 points to improve basic and instrumental ADLs performance, including lymphedema self-care.  Baseline: Max A Goal status:GOAL DISCHARGED.  FOTO TOOL DISCONTINUED AT CLINIC   Given this patient's Intake score of 50% on the Lymphedema Life Impact Scale (LLIS), patient will experience a reduction of at least 5 points in her perceived level of functional impairment resulting from lymphedema to improve functional performance and quality of life (QOL). Baseline: % Goal status: PROGRESSING  Pt will achieve at least a 10% volume reduction in B legs to return limb to typical size and shape, to limit  infection risk and LE progression, to decrease pain, to improve function. Baseline: Dependent Goal status: PROGRESSING  4.  Pt will obtain proper compression garments/devices and achieve modified independence (extra time + assistive devices) with donning/doffing to optimize limb volume reductions and limit LE  progression over time. Baseline:  Goal status: ONGOING. Garments  5.  During Intensive phase CDT , with modified independence, Pt will achieve at least 85% compliance with all lymphedema self-care home program components, including daily skin care, compression wraps and /or garments, simple self MLD and lymphatic pumping therex to habituate LE self care protocol  into ADLs for optimal LE self-management over time. Baseline: Dependent Goal status: PROGRESSING   PLAN:  OT FREQUENCY: 2x/week  OT DURATION: 12 weeks  PLANNED INTERVENTIONS: 97110-Therapeutic exercises, 97530- Therapeutic activity, 97535- Self Care, [id number]- Manual therapy, Patient/Family education, Manual lymph drainage, Compression bandaging, and fit with appropriate compression garments  PLAN FOR NEXT SESSION:  Cont MLD Cont Pt edu for LE self-care  Zebedee Dec, MS, OTR/L, CLT-LANA 02/07/23 12:43 PM '

## 2023-02-07 NOTE — Therapy (Signed)
 OUTPATIENT PHYSICAL THERAPY FEMALE PELVIC TREATMENT   Patient Name: Karen [name] MRN: [id number] DOB:02-Nov-1970, 53 y.o., female Today's Date: 02/07/2023  END OF SESSION:  PT End of Session - 02/07/23 1612     Visit Number 28    Date for PT Re-Evaluation 04/26/23    Authorization Type [name]    PT Start Time 1615    PT Stop Time 1655    PT Time Calculation (min) 40 min    Activity Tolerance Patient tolerated treatment well    Behavior During Therapy [facility] for tasks assessed/performed                     Past Medical History:  Diagnosis Date   Adenomyosis    Anemia    Arthritis 2013   L knee gets steroid injections    Asthmatic bronchitis 01/31/2017   Back pain    Chronic constipation    Diarrhea    DVT of lower extremity (deep venous thrombosis) (HCC)    Dysmenorrhea    Endometriosis    Esophageal stricture    GERD (gastroesophageal reflux disease)    History of kidney stones    History of uterine fibroid    IBS (irritable bowel syndrome)    Interstitial cystitis    Lactose intolerance    Migraine    with aura   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   PONV (postoperative nausea and vomiting)    Recurrent upper respiratory infection (URI)    Sebaceous cyst of breast, right lower inner quadrant 10/25/2012   Excised 11/21/12    Sleep apnea    Syncope, non cardiac    Past Surgical History:  Procedure Laterality Date   ARTHROSCOPIC HAGLUNDS REPAIR     BREAST CYST EXCISION Right 11/21/2012   Procedure: CYST EXCISION BREAST;  Surgeon: Sherlean JINNY Laughter, MD;  Location: [facility];  Service: General;  Laterality: Right;   BREAST CYST EXCISION Bilateral 01/18/2022   Procedure: EXCISION OF SEBACEOUS CYST BILATERAL BREAST;  Surgeon: Vanderbilt Ned, MD;  Location: [facility];  Service: General;  Laterality: Bilateral;   BREAST EXCISIONAL BIOPSY Right 11/2012   CESAREAN SECTION  04/19/1993   CHOLECYSTECTOMY      COLONOSCOPY  05/02/2011   Procedure: COLONOSCOPY;  Surgeon: Lynwood LITTIE Celestia Mickey., MD;  Location: [facility];  Service: Endoscopy;  Laterality: N/A;   colonoscopy  11/04/2014   DENTAL SURGERY  03/06/2018   2 surgeries ( 03/06/2018 and 04/06/2018)   to remove two separate benign tumors   ESOPHAGEAL MANOMETRY N/A 11/04/2015   Procedure: ESOPHAGEAL MANOMETRY (EM);  Surgeon: Lynwood Celestia, MD;  Location: [facility];  Service: Endoscopy;  Laterality: N/A;   ESOPHAGOGASTRODUODENOSCOPY  05/02/2011   Procedure: ESOPHAGOGASTRODUODENOSCOPY (EGD);  Surgeon: Lynwood LITTIE Celestia Mickey., MD;  Location: [facility];  Service: Endoscopy;  Laterality: N/A;   ESOPHAGOGASTRODUODENOSCOPY N/A 09/01/2014   Procedure: ESOPHAGOGASTRODUODENOSCOPY (EGD);  Surgeon: Lynwood Celestia, MD;  Location: [facility];  Service: Endoscopy;  Laterality: N/A;   KNEE SURGERY     LAPAROSCOPY     x 4   LESION REMOVAL N/A 01/18/2022   Procedure: EXCISION OF SEBACEOUS CYSTS CHEST AND NECK;  Surgeon: Vanderbilt Ned, MD;  Location: [facility];  Service: General;  Laterality: N/A;   PH IMPEDANCE STUDY N/A 11/04/2015   Procedure: PH IMPEDANCE STUDY;  Surgeon: Lynwood Celestia, MD;  Location: [facility];  Service: Endoscopy;  Laterality: N/A;   VAGINAL HYSTERECTOMY  2010   TVH--ovaries remain  Patient Active Problem List   Diagnosis Date Noted   Sjogren syndrome with keratoconjunctivitis (HCC) 12/09/2022   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   Elevated LFTs 04/04/2021   Colitis 04/04/2021   Bacterial overgrowth syndrome 05/21/2020   Obesity 05/21/2020   History of endometriosis 05/21/2020   Constipation due to outlet dysfunction 02/05/2020   Gastroesophageal reflux disease 02/05/2020   Obstructive sleep apnea treated with continuous positive airway pressure (CPAP) 06/16/2017   Perennial allergic rhinitis with a nonallergic component 01/31/2017   Asthmatic bronchitis 01/31/2017   GI symptoms 01/31/2017   Family history of  colon cancer 01/27/2015   Chest pain 12/13/2012   Dyspnea 12/13/2012   DVT of lower extremity (deep venous thrombosis) (HCC) 01/03/1990    PCP: Elliot Charm, MD  REFERRING PROVIDER: Cleotilde Ronal RAMAN, MD   REFERRING DIAG: M62.89 (ICD-10-CM) - Pelvic floor dysfunction  THERAPY DIAG:  Pelvic pain  Other low back pain  Muscle weakness (generalized)  Other muscle spasm  Unspecified lack of coordination  Rationale for Evaluation and Treatment: Rehabilitation  ONSET DATE: chronic  SUBJECTIVE:                                                                                                                                                                                           SUBJECTIVE STATEMENT:  Pt states that vulva is feeling much less irritated after seeing dermatologist. She has been using water wipes, desitin, and yeast ointment. She states that she can more comfortably sit now. She continues to feel that her bowel movements are becoming more productive. She has held off on doing cleanse. She still has not taken any Miralax .  PAIN: 02/07/23 Are you having pain? Yes NPRS scale: 2/10 Pain location:  Lower abdominal pain, Lt sided pain, low back pain  Pain type: turning, knotted, pressure Pain description: intermittent and constant   Aggravating factors: constipation or frequent bowel movements/constantly having bowel movements  Relieving factors: exercises (bowel massage, happy baby, spinal twists, walking)  PRECAUTIONS: None  WEIGHT BEARING RESTRICTIONS: No  FALLS:  Has patient fallen in last 6 months? No  LIVING ENVIRONMENT: Lives with: lives with their spouse Lives in: House/apartment  OCCUPATION: nurse, in admin  PLOF: Independent  PATIENT GOALS: decrease pain and have more normal bowel movements  PERTINENT HISTORY:  Vaginal hysterectomy, DVT LE, endometriosis, interstitial cystitis, exploratory laps for endometriosis, c-section,  cholecystectomy Sexual abuse: No  BOWEL MOVEMENT: Pain with bowel movement: Yes Type of bowel movement:Type (Bristol Stool Scale) 1-7 (sometimes full range in the same bowel movement), Frequency sometimes many times a day, sometimes up to 4 days in between, and Strain Yes Fully empty  rectum: No Leakage: No Pads: No Fiber supplement: No - has attempted metamucil and it made constipation worse  URINATION: Pain with urination: Yes Fully empty bladder: No Stream:  varies  Urgency: Yes: all the time Frequency: multiple times an hours  Leakage: Urge to void, Walking to the bathroom, Coughing, Sneezing, Laughing, and Exercise Pads: Yes: daily  INTERCOURSE: Pain with intercourse: Initial Penetration and During Penetration Ability to have vaginal penetration:  Yes: with pain Climax: non painful WNL   PREGNANCY: Vaginal deliveries 1 Tearing Yes: 3rd degree tear C-section deliveries 1 Currently pregnant No  PROLAPSE: Periodically will feel vaginal bulging, heaviness in lower abdomen   OBJECTIVE:  11/09/22: No internal rectal or vaginal pelvic floor exam performed due to high level of skin irritation   Weak transversus abdominus contraction  Abdominal restriction and tenderness in bil lower quadrant  Unable to perform pelvic tilt with appropriate coordination  LUMBARAROM/PROM:  A/PROM A/PROM  Eval (% available) 11/09/22 (% available)  Flexion 50 90  Extension 25, pain anteriorly  25, pain in low back  Right lateral flexion 50, pain anteriorly  75, pain on right  Left lateral flexion 50, pain anteriorly  75, pain on right   (Blank rows = not tested) 07/21/22: standing prolapse assessment demonstrated grade 2 anterior vaginal wall laxity   06/08/22:               External Perineal Exam: WNL                              Internal Pelvic Floor: burning reported with palpation of superficial muscles; deep aching/pressure with palpation of Lt levator ani   Patient confirms  identification and approves PT to assess internal pelvic floor and treatment Yes  PELVIC MMT:   MMT eval  Vaginal 3/5  Internal Anal Sphincter 1/5  External Anal Sphincter 2/5  Puborectalis 1/5  Diastasis Recti   (Blank rows = not tested)        TONE: High in Lt levator ani   PROLAPSE: Not able to tell this session due to dyssynergic pelvic floor contraction     06/01/22: COGNITION: Overall cognitive status: Within functional limits for tasks assessed     SENSATION: Light touch: Appears intact Proprioception: Appears intact  GAIT: Comments: decreased hip extension, forward flexed trunk  POSTURE: rounded shoulders, forward head, decreased lumbar lordosis, increased thoracic kyphosis, posterior pelvic tilt, and flexed trunk    LUMBARAROM/PROM:  A/PROM A/PROM  Eval (% available)  Flexion 50  Extension 25, pain anteriorly   Right lateral flexion 50, pain anteriorly   Left lateral flexion 50, pain anteriorly    (Blank rows = not tested)   PALPATION:   General  Significant abdominal restriction and tenderness; decreased rib mobility with mobilization and breathing    TODAY'S TREATMENT:  DATE:  02/07/23 Manual: Abdominal soft tissue mobilization  Bowel mobilization Ileocecal valve myofascial release Neuromuscular re-education: Transversus abdominus training with multimodal cues for improved motor control and breath coordination  Transversus abdominus isometrics 10x Bil supine UE ball press with transversus abdominus and pelvic floor muscle contractions and breath coordination 10x Supine hip adduction ball press with transversus abdominus and pelvic floor muscle contractions and breath coordination 10x Bridge with hip adduction, transversus abdominus, and pelvic floor muscle 2 x 10 Seated hip abduction red band with transversus abdominus and  pelvic floor muscle 2 x 10 Seated resisted march red band with transversus abdominus and pelvic floor muscle 2 x 10  01/30/23 Manual: Abdominal soft tissue mobilization  Bowel mobilization Ileocecal valve myofascial release  01/18/23 Manual: Abdominal soft tissue mobilization  Bowel mobilization Ileocecal valve myofascial release Exercises: Seated forward fold 15 breaths Lateral side body stretch 10 breaths bil Open books 10x bil Lower trunk rotation wide feet 10x    PATIENT EDUCATION:  Education details: See above Person educated: Patient Education method: Programmer, Multimedia, Demonstration, Tactile cues, Verbal cues, and Handouts Education comprehension: verbalized understanding  HOME EXERCISE PROGRAM: [id number]  ASSESSMENT:  CLINICAL IMPRESSION: Pt doing better this week with vulvar irritation and feeling like she is emptying more completely.We continued manual techniques to abdomen as she believes that have been very helpful. She is likely seeing progress in part due to avoiding cleanse on a regular basis and allowing normal bulk to build up in colon. She may also starting to see impact from new medications addressing any underlying autoimmune disease. Due to progress, we returned to core training today as this will help further progress with bladder control. She did very well, but had some difficulty with coordination of breathing with exercise and core activation. Good tolerance to all exercises. She will continue to benefit from skilled PT intervention in order to decrease pain, improve bowel movements, and decrease urinary urgency/incontinence.     OBJECTIVE IMPAIRMENTS: decreased activity tolerance, decreased coordination, decreased endurance, decreased mobility, decreased strength, increased fascial restrictions, increased muscle spasms, impaired tone, postural dysfunction, and pain.   ACTIVITY LIMITATIONS: lifting, bending, continence, and locomotion level  PARTICIPATION  LIMITATIONS: interpersonal relationship, community activity, and occupation  PERSONAL FACTORS: 1 comorbidity: medical history  are also affecting patient's functional outcome.   REHAB POTENTIAL: Good  CLINICAL DECISION MAKING: Stable/uncomplicated  EVALUATION COMPLEXITY: Low   GOALS: Goals reviewed with patient? Yes  SHORT TERM GOALS: Target date: 07/06/22 - updated 07/12/22 - updated 08/18/22 - updated 09/22/22 - updated 10/26/22 - updated 11/09/22 - updated 12/15/22 updated 01/18/23  Pt will be independent with HEP.   Baseline: Goal status: MET 07/12/22  2.  Pt will be independent with diaphragmatic breathing and down training activities in order to improve pelvic floor relaxation.  Baseline:  Goal status: MET 07/12/22  3.  Pt will be independent with the knack, urge suppression technique, and double voiding in order to improve bladder habits and decrease urinary incontinence.   Baseline:  Goal status: MET 09/22/22  4.  Pt will be independent with use of squatty potty, relaxed toileting mechanics, and improved bowel movement techniques in order to increase ease of bowel movements and complete evacuation.   Baseline:  Goal status: MET 11/09/22  5.  Pt will be able to correctly perform diaphragmatic breathing and appropriate pressure management in order to prevent worsening vaginal wall laxity and improve pelvic floor A/ROM.   Baseline:  Goal status: MET 01/18/23  LONG TERM GOALS: Target date:  11/16/2022 - updated 07/12/22 - updated 08/18/22 - updated 09/22/22 - updated 10/26/22 - updated 11/09/22 - updated 12/15/22 - updated 01/18/23  Pt will be independent with advanced HEP.   Baseline:  Goal status: IN PROGRESS 01/18/23  2.  Pt will demonstrate normal pelvic floor muscle tone and A/ROM, able to achieve 4/5 strength with contractions and 10 sec endurance, in order to provide appropriate lumbopelvic support in functional activities.   Baseline:  Goal status: IN PROGRESS 12/15/22  3.   Pt will increase all impaired lumbar A/ROM by 25% without pain.  Baseline:  Goal status:  IN PROGRESS 12/15/22  4.  Pt will report pain no higher than 3/10 with any activity. Baseline:  Goal status:  IN PROGRESS 12/15/22  5.  Pt will report 0/10 pain with vaginal penetration in order to improve intimate relationship with partner.    Baseline:  Goal status:  IN PROGRESS 12/15/22  6.  Pt will be able to go 2-3 hours in between voids without urgency or incontinence in order to improve QOL and perform all functional activities with less difficulty.   Baseline:  Goal status: MET 01/18/23  7.  Pt will report no leaks with laughing, coughing, sneezing in order to improve comfort with interpersonal relationships and community activities.   Baseline:  Goal status:  IN PROGRESS 01/18/23  8.  Pt will have consistent bowel movements 4-5x/week without straining or pain.  Baseline:  Goal status:  MET 01/18/23  PLAN:  PT FREQUENCY: 1-2x/week  PT DURATION: 6 months  PLANNED INTERVENTIONS: Therapeutic exercises, Therapeutic activity, Neuromuscular re-education, Balance training, Gait training, Patient/Family education, Self Care, Joint mobilization, Dry Needling, Biofeedback, and Manual therapy  PLAN FOR NEXT SESSION: Continue to work on motor control activities surrounding pelvis; progress mobility; manual techniques as needed for comfort and to increase circulation.   Josette Mares, PT, DPT02/04/254:58 PM

## 2023-02-08 ENCOUNTER — Ambulatory Visit: Payer: Commercial Managed Care - PPO | Admitting: Occupational Therapy

## 2023-02-08 DIAGNOSIS — I890 Lymphedema, not elsewhere classified: Secondary | ICD-10-CM

## 2023-02-08 NOTE — Therapy (Signed)
 OUTPATIENT OCCUPATIONAL THERAPY PROGRESS REPORT AND TREATMENT NOTE  L LOWER EXTREMITY/ B LOWER QUADRANT LYMPHEDEMA  Patient Name: Karen [name] MRN: [id number] DOB:[birth year], 53 y.o., female Today's Date: 02/08/2023  REPORTING PERIOD: 12/22/22 - 02/07/23  END OF SESSION:   OT End of Session - 02/08/23 1506     Visit Number 21    Number of Visits 36    Date for OT Re-Evaluation 02/08/23    OT Start Time 0306    OT Stop Time 0401    OT Time Calculation (min) 55 min    Activity Tolerance Patient tolerated treatment well;No increased pain    Behavior During Therapy [facility] for tasks assessed/performed               Past Medical History:  Diagnosis Date   Adenomyosis    Anemia    Arthritis 2013   L knee gets steroid injections    Asthmatic bronchitis 01/31/2017   Back pain    Chronic constipation    Diarrhea    DVT of lower extremity (deep venous thrombosis) (HCC)    Dysmenorrhea    Endometriosis    Esophageal stricture    GERD (gastroesophageal reflux disease)    History of kidney stones    History of uterine fibroid    IBS (irritable bowel syndrome)    Interstitial cystitis    Lactose intolerance    Migraine    with aura   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   PONV (postoperative nausea and vomiting)    Recurrent upper respiratory infection (URI)    Sebaceous cyst of breast, right lower inner quadrant 10/25/2012   Excised 11/21/12    Sleep apnea    Syncope, non cardiac    Past Surgical History:  Procedure Laterality Date   ARTHROSCOPIC HAGLUNDS REPAIR     BREAST CYST EXCISION Right 11/21/2012   Procedure: CYST EXCISION BREAST;  Surgeon: Sherlean JINNY Laughter, MD;  Location: [facility];  Service: General;  Laterality: Right;   BREAST CYST EXCISION Bilateral 01/18/2022   Procedure: EXCISION OF SEBACEOUS CYST BILATERAL BREAST;  Surgeon: Vanderbilt Ned, MD;  Location: [facility];  Service: General;  Laterality: Bilateral;   BREAST  EXCISIONAL BIOPSY Right 11/2012   CESAREAN SECTION  04/19/1993   CHOLECYSTECTOMY     COLONOSCOPY  05/02/2011   Procedure: COLONOSCOPY;  Surgeon: Lynwood LITTIE Celestia Mickey., MD;  Location: [facility];  Service: Endoscopy;  Laterality: N/A;   colonoscopy  11/04/2014   DENTAL SURGERY  03/06/2018   2 surgeries ( 03/06/2018 and 04/06/2018)   to remove two separate benign tumors   ESOPHAGEAL MANOMETRY N/A 11/04/2015   Procedure: ESOPHAGEAL MANOMETRY (EM);  Surgeon: Lynwood Celestia, MD;  Location: [facility];  Service: Endoscopy;  Laterality: N/A;   ESOPHAGOGASTRODUODENOSCOPY  05/02/2011   Procedure: ESOPHAGOGASTRODUODENOSCOPY (EGD);  Surgeon: Lynwood LITTIE Celestia Mickey., MD;  Location: [facility];  Service: Endoscopy;  Laterality: N/A;   ESOPHAGOGASTRODUODENOSCOPY N/A 09/01/2014   Procedure: ESOPHAGOGASTRODUODENOSCOPY (EGD);  Surgeon: Lynwood Celestia, MD;  Location: [facility];  Service: Endoscopy;  Laterality: N/A;   KNEE SURGERY     LAPAROSCOPY     x 4   LESION REMOVAL N/A 01/18/2022   Procedure: EXCISION OF SEBACEOUS CYSTS CHEST AND NECK;  Surgeon: Vanderbilt Ned, MD;  Location: [facility];  Service: General;  Laterality: N/A;   PH IMPEDANCE STUDY N/A 11/04/2015   Procedure: PH IMPEDANCE STUDY;  Surgeon: Lynwood Celestia, MD;  Location: [facility];  Service: Endoscopy;  Laterality: N/A;  VAGINAL HYSTERECTOMY  2010   TVH--ovaries remain   Patient Active Problem List   Diagnosis Date Noted   Sjogren syndrome with keratoconjunctivitis (HCC) 12/09/2022   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   Elevated LFTs 04/04/2021   Colitis 04/04/2021   Bacterial overgrowth syndrome 05/21/2020   Obesity 05/21/2020   History of endometriosis 05/21/2020   Constipation due to outlet dysfunction 02/05/2020   Gastroesophageal reflux disease 02/05/2020   Obstructive sleep apnea treated with continuous positive airway pressure (CPAP) 06/16/2017   Perennial allergic rhinitis with a nonallergic component  01/31/2017   Asthmatic bronchitis 01/31/2017   GI symptoms 01/31/2017   Family history of colon cancer 01/27/2015   Chest pain 12/13/2012   Dyspnea 12/13/2012   DVT of lower extremity (deep venous thrombosis) (HCC) 01/03/1990    PCP: Gaile New, MD  REFERRING PROVIDER: Valery Ripple, MD  REFERRING DIAG: I89.0  THERAPY DIAG:  Lymphedema, not elsewhere classified  Rationale for Evaluation and Treatment: Rehabilitation  ONSET DATE: ~2014; exacerbation in 2023  SUBJECTIVE:                                                                                                                                                                                           SUBJECTIVE STATEMENT: Karen [name] returns to OT for Rx visit for BLE/BLQ lymphedema care. Pt does not rate pain numerically today. Pt has no new complaints.   PERTINENT HISTORY: CVI, OSA (no CPAP), GERD, Esophageal stricture, IBS, Lactose intolerant, Diarrhea, Hx LE DVT  PAIN:  Are you having pain? Yes: NPRS scale: not rated not rated/10 Pain location: BLE Pain description: myalgia, tight, heavy, tingling, numbness Aggravating factors: standing, walking, dependent sitting > 30 minutes Relieving factors: elevation  PRECAUTIONS: Other: LYMPHEDEMA; Hx LLE DVT  RED FLAGS: Hx LLE DVT  WEIGHT BEARING RESTRICTIONS: No  FALLS:  Has patient fallen in last 6 months? No  LIVING ENVIRONMENT: Lives with: lives with spouse and daughter Lives in: House/apartment Stairs: Yes; Internal: 14 steps; yes and External: 4 steps; yes Has following equipment at home: None  OCCUPATION: [employer] Services Operational Officer, walking, desk work  LEISURE: family time  HAND DOMINANCE: right   PRIOR LEVEL OF FUNCTION: Independent  PATIENT GOALS: Feel better, be able to be more active without pain, wear preferred street shoes  OBJECTIVE: Note: Objective measures were completed at Evaluation unless otherwise noted.  COGNITION:  Overall cognitive  status: Within functional limits for tasks assessed   OBSERVATIONS / OTHER ASSESSMENTS:   POSTURE: WFL  LE ROM: WFL  LE MMT: WFL  LYMPHEDEMA ASSESSMENTS: non-Ca related  INFECTIONS: denies cellulitis and wound hx  BLE COMPARATIVE LIMB VOLUMETRICS Initial 11/15/22  LANDMARK RIGHT (dominant)  R LEG (A-D) 3028.9 ml  R THIGH (E-G) 4809.60 ml  R FULL LIMB (A-G) 7838.5 ml  Limb Volume differential (LVD)  %  Volume change since initial %  Volume change overall V  (Blank rows = not tested)  LANDMARK LEFT  (Rx)  L LEG (A-D) 3189.9 ml  L THIGH (E-G) 4959.8 ml  L FULL LIMB (A-G) 8149.7 ml  Limb Volume differential (LVD)  LEG LVD = 5.32%, L>R; THIGH LVD = 3.12%, L>R; And full LLE LVD = 3.97%, L>R. 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 Obesity  Skin  Description Hyper-Keratosis Peau d' Orange Shiny Tight Fibrotic/ Indurated Fatty Doughy Spongy/ boggy       R>L x  x   Skin dry Flaky WNL Macerated   mildly      Color Redness Varicosities Blanching Hemosiderin Stain Mottled        x   Odor Malodorous Yeast Fungal infection  WNL      x   Temperature Warm Cool wnl     x    Pitting Edema   1+ 2+ 3+ 4+ Non-pitting         x   Girth Symmetrical Asymmetrical                   Distribution    L>R toes to groin    Stemmer Sign Positive Negative   +L -R   Lymphorrhea History Of:  Present Absent     x    Wounds History Of Present Absent Venous Arterial Pressure Sheer     x        Signs of Infection Redness Warmth Erythema Acute Swelling Drainage Borders                    Sensation Light Touch Deep pressure Hypersensitivity   In tact Impaired In tact Impaired Absent Impaired   x  x  x     Nails WNL   Fungus nail dystrophy   x     Hair Growth Symmetrical Asymmetrical   x    Skin Creases Base of toes  Ankles   Base of Fingers knees        Abdominal pannus Thigh Lobules  Face/neck   x           GAIT: Distance walked: >500' Assistive device utilized: None Level of assistance: Complete Independence Comments: Functional ambulation and transfers [facility]  LYMPHEDEMA LIFE IMPACT SCALE (LLIS): Initial 11/15/22  50%  FOTO functional outcome measure: Deferred. FOTO discontinued.  TODAY'S TREATMENT:  Pt edu re lymphedema precautions and reviewed progress and obstacles to date. MLD to BLQ emphasis on colon and LLE  PATIENT EDUCATION:  Continued Pt/ CG edu for how function of cisterna chyli, part of the thoracic duct of deep abdominal lymphatics, assists with digestion by filtering many of the fats and proteins from the digestive system. Sub optimal lymphatic flow in this region may result in constipation, bloating, swelling, etc. MLD helps mobilize these protein and fats , and the rhythmic movement of manual lymphatic drainage stimulates digestive muscles and increase peristalsis. Provided printed resource for reference.  Continued lymphedema self care home program training throughout session. Topics include outcome of comparative limb volumetrics- starting limb volume differentials (LVDs), technology and gradient techniques used for short stretch, multilayer compression wrapping, simple self-MLD, therapeutic lymphatic pumping exercises, skin/nail care, LE precautions,. compression garment recommendations and specifications, wear and care schedule and compression garment donning / doffing w assistive devices. Discussed progress towards all OT goals since commencing CDT. All questions answered to the Pt's satisfaction. Good return. Person educated: Patient  Education method: Explanation, Demonstration, and Handouts Education comprehension: verbalized understanding, returned demonstration, verbal cues  required, and needs further education  HOME EXERCISE PROGRAM: BLE lymphatic pumping there ex using- 1 sets of 10 reps, each exercise in order-  1-2 x daily, bilaterally Simple self MLD 1 x daily Daily skin care to increase hydration, skin mobility and decrease infection risk- can be done during MLD Compression wraps 23/7 during intensive phase of CDT Compression garments/ devices during self-management phase of CDT  ASSESSMENT:  CLINICAL IMPRESSION: Pt tolerated abdominal MLD and MLD to LLE/LLQ today without increased pain. Pt continues to make steady progress towards all goals. Cont as per POC.  11/15/22 Initial Evaluation: Karen [name] is a 53 y.o. female presenting with very mild, stage II, LLE lymphedema 2/2 venous insufficiency and obesity. L Leg swelling and associated pain swelling fluctuates. It has progressed over time, and now no longer resolves with elevation over night. RLE swelling limits balance during functional ambulation. It exacerbates infection and falls risk. LLE/RLQ lymphedema interferes with functional performance in all occupational domains, including basic and instrumental ADLs, productive activities, leisure pursuits and social participation. Pt will benefit from Occupational Therapy for Complete Decongestive Therapy (CDT) to restore function, to reduce physical and psychologic suffering associated with chronic, progressive lymphedema and associated pain, and to limit infection. CDT will include manual lymphatic drainage (MLD), skin care, therapeutic exercise and compression wraps and garment's devices. Without skilled OT for lymphedema care the condition will worsen and further functional decline is expected  Custom-made gradient compression garments and HOS devices are medically necessary because they are uniquely sized and shaped to fit the exact dimensions of the affected extremities, and to provide appropriate medical grade, graduated compression essential for optimally  managing chronic, progressive lymphedema. Multiple custom compression garments are needed to ensure proper hygiene to limit infection risk. Custom compression garments should be replaced q 3-6 months When worn consistently for optimal lipo-lymphedema self-management over time. HOS devices, medically necessary to limit fibrosis buildup in tissue, should be replaced q 2 years and PRN when worn out.      OBJECTIVE IMPAIRMENTS: decreased activity tolerance, decreased knowledge of condition, decreased knowledge of use of DME, increased edema, impaired sensation, pain, and chronic, progressive leg swelling .   ACTIVITY LIMITATIONS: dependent sitting, extended standing and/ or walking, squatting, and lower body dressing, fitting preferred street shoes  PARTICIPATION LIMITATIONS: shopping, community activity, occupation, and yard work  PERSONAL  FACTORS: 3+ comorbidities: Hx DVT,  OSA, CVI. Varicose veins  are also affecting patient's functional outcome.   REHAB POTENTIAL: Good  EVALUATION COMPLEXITY: Moderate   GOALS: Goals reviewed with patient? Yes  SHORT TERM GOALS: Target date: 4th OT Rx visit  SHORT TERM GOALS: Target date: 4th OT Rx visit   Pt will demonstrate understanding of lymphedema precautions and prevention strategies with modified independence using a printed reference to identify at least 5 precautions and discussing how s/he may implement them into daily life to reduce risk of progression with extra time. Baseline:Max A Goal status: GOAL MET   Pt will be able to apply multilayer, knee length, gradient, compression wraps to one leg at a time with modified assistance (extra time and assistive device/s) to decrease limb volume, to limit infection risk, and to limit lymphedema progression.  Baseline: Dependent Goal status: GOAL DISCHARGED. Pt Going to compression garments instead  LONG TERM GOALS: Target date: 02/08/23  Given this patient's Intake score of 67%/100% on the  functional outcomes FOTO tool, patient will experience an increase in function of 5 points to improve basic and instrumental ADLs performance, including lymphedema self-care.  Baseline: Max A Goal status:GOAL DISCHARGED.  FOTO TOOL DISCONTINUED AT CLINIC   Given this patient's Intake score of 50% on the Lymphedema Life Impact Scale (LLIS), patient will experience a reduction of at least 5 points in her perceived level of functional impairment resulting from lymphedema to improve functional performance and quality of life (QOL). Baseline: % Goal status: PROGRESSING  Pt will achieve at least a 10% volume reduction in B legs to return limb to typical size and shape, to limit infection risk and LE progression, to decrease pain, to improve function. Baseline: Dependent Goal status: PROGRESSING  4.  Pt will obtain proper compression garments/devices and achieve modified independence (extra time + assistive devices) with donning/doffing to optimize limb volume reductions and limit LE  progression over time. Baseline:  Goal status: ONGOING. Garments  5.  During Intensive phase CDT , with modified independence, Pt will achieve at least 85% compliance with all lymphedema self-care home program components, including daily skin care, compression wraps and /or garments, simple self MLD and lymphatic pumping therex to habituate LE self care protocol  into ADLs for optimal LE self-management over time. Baseline: Dependent Goal status: PROGRESSING   PLAN:  OT FREQUENCY: 2x/week  OT DURATION: 12 weeks  PLANNED INTERVENTIONS: 97110-Therapeutic exercises, 97530- Therapeutic activity, 97535- Self Care, [id number]- Manual therapy, Patient/Family education, Manual lymph drainage, Compression bandaging, and fit with appropriate compression garments  PLAN FOR NEXT SESSION:  Cont MLD Cont Pt edu for LE self-care  Zebedee Dec, MS, OTR/L, CLT-LANA 02/08/23 4:02 PM '

## 2023-02-08 NOTE — Therapy (Deleted)
 OUTPATIENT PHYSICAL THERAPY FEMALE PELVIC TREATMENT   Patient Name: Karen [name] MRN: [id number] DOB:03/01/1970, 53 y.o., female Today's Date: 02/08/2023  END OF SESSION:            Past Medical History:  Diagnosis Date   Adenomyosis    Anemia    Arthritis 2013   L knee gets steroid injections    Asthmatic bronchitis 01/31/2017   Back pain    Chronic constipation    Diarrhea    DVT of lower extremity (deep venous thrombosis) (HCC)    Dysmenorrhea    Endometriosis    Esophageal stricture    GERD (gastroesophageal reflux disease)    History of kidney stones    History of uterine fibroid    IBS (irritable bowel syndrome)    Interstitial cystitis    Lactose intolerance    Migraine    with aura   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   PONV (postoperative nausea and vomiting)    Recurrent upper respiratory infection (URI)    Sebaceous cyst of breast, right lower inner quadrant 10/25/2012   Excised 11/21/12    Sleep apnea    Syncope, non cardiac    Past Surgical History:  Procedure Laterality Date   ARTHROSCOPIC HAGLUNDS REPAIR     BREAST CYST EXCISION Right 11/21/2012   Procedure: CYST EXCISION BREAST;  Surgeon: Sherlean JINNY Laughter, MD;  Location: [facility];  Service: General;  Laterality: Right;   BREAST CYST EXCISION Bilateral 01/18/2022   Procedure: EXCISION OF SEBACEOUS CYST BILATERAL BREAST;  Surgeon: Vanderbilt Ned, MD;  Location: [facility];  Service: General;  Laterality: Bilateral;   BREAST EXCISIONAL BIOPSY Right 11/2012   CESAREAN SECTION  04/19/1993   CHOLECYSTECTOMY     COLONOSCOPY  05/02/2011   Procedure: COLONOSCOPY;  Surgeon: Lynwood LITTIE Celestia Mickey., MD;  Location: [facility];  Service: Endoscopy;  Laterality: N/A;   colonoscopy  11/04/2014   DENTAL SURGERY  03/06/2018   2 surgeries ( 03/06/2018 and 04/06/2018)   to remove two separate benign tumors   ESOPHAGEAL MANOMETRY N/A 11/04/2015   Procedure: ESOPHAGEAL MANOMETRY  (EM);  Surgeon: Lynwood Celestia, MD;  Location: [facility];  Service: Endoscopy;  Laterality: N/A;   ESOPHAGOGASTRODUODENOSCOPY  05/02/2011   Procedure: ESOPHAGOGASTRODUODENOSCOPY (EGD);  Surgeon: Lynwood LITTIE Celestia Mickey., MD;  Location: [facility];  Service: Endoscopy;  Laterality: N/A;   ESOPHAGOGASTRODUODENOSCOPY N/A 09/01/2014   Procedure: ESOPHAGOGASTRODUODENOSCOPY (EGD);  Surgeon: Lynwood Celestia, MD;  Location: [facility];  Service: Endoscopy;  Laterality: N/A;   KNEE SURGERY     LAPAROSCOPY     x 4   LESION REMOVAL N/A 01/18/2022   Procedure: EXCISION OF SEBACEOUS CYSTS CHEST AND NECK;  Surgeon: Vanderbilt Ned, MD;  Location: [facility];  Service: General;  Laterality: N/A;   PH IMPEDANCE STUDY N/A 11/04/2015   Procedure: PH IMPEDANCE STUDY;  Surgeon: Lynwood Celestia, MD;  Location: [facility];  Service: Endoscopy;  Laterality: N/A;   VAGINAL HYSTERECTOMY  2010   TVH--ovaries remain   Patient Active Problem List   Diagnosis Date Noted   Sjogren syndrome with keratoconjunctivitis (HCC) 12/09/2022   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   Elevated LFTs 04/04/2021   Colitis 04/04/2021   Bacterial overgrowth syndrome 05/21/2020   Obesity 05/21/2020   History of endometriosis 05/21/2020   Constipation due to outlet dysfunction 02/05/2020   Gastroesophageal reflux disease 02/05/2020   Obstructive sleep apnea treated with continuous positive airway pressure (CPAP) 06/16/2017   Perennial allergic rhinitis with a  nonallergic component 01/31/2017   Asthmatic bronchitis 01/31/2017   GI symptoms 01/31/2017   Family history of colon cancer 01/27/2015   Chest pain 12/13/2012   Dyspnea 12/13/2012   DVT of lower extremity (deep venous thrombosis) (HCC) 01/03/1990    PCP: Elliot Charm, MD  REFERRING PROVIDER: Cleotilde Ronal RAMAN, MD   REFERRING DIAG: [phone] (ICD-10-CM) - Pelvic floor dysfunction  THERAPY DIAG:  Lymphedema, not elsewhere classified  Rationale for  Evaluation and Treatment: Rehabilitation  ONSET DATE: chronic  SUBJECTIVE:                                                                                                                                                                                           SUBJECTIVE STATEMENT:  Pt states that vulva is feeling much less irritated after seeing dermatologist. She has been using water wipes, desitin, and yeast ointment. She states that she can more comfortably sit now. She continues to feel that her bowel movements are becoming more productive. She has held off on doing cleanse. She still has not taken any Miralax .  PAIN: 02/07/23 Are you having pain? Yes NPRS scale: 2/10 Pain location:  Lower abdominal pain, Lt sided pain, low back pain  Pain type: turning, knotted, pressure Pain description: intermittent and constant   Aggravating factors: constipation or frequent bowel movements/constantly having bowel movements  Relieving factors: exercises (bowel massage, happy baby, spinal twists, walking)  PRECAUTIONS: None  WEIGHT BEARING RESTRICTIONS: No  FALLS:  Has patient fallen in last 6 months? No  LIVING ENVIRONMENT: Lives with: lives with their spouse Lives in: House/apartment  OCCUPATION: nurse, in admin  PLOF: Independent  PATIENT GOALS: decrease pain and have more normal bowel movements  PERTINENT HISTORY:  Vaginal hysterectomy, DVT LE, endometriosis, interstitial cystitis, exploratory laps for endometriosis, c-section, cholecystectomy Sexual abuse: No  BOWEL MOVEMENT: Pain with bowel movement: Yes Type of bowel movement:Type (Bristol Stool Scale) 1-7 (sometimes full range in the same bowel movement), Frequency sometimes many times a day, sometimes up to 4 days in between, and Strain Yes Fully empty rectum: No Leakage: No Pads: No Fiber supplement: No - has attempted metamucil and it made constipation worse  URINATION: Pain with urination: Yes Fully empty bladder:  No Stream:  varies  Urgency: Yes: all the time Frequency: multiple times an hours  Leakage: Urge to void, Walking to the bathroom, Coughing, Sneezing, Laughing, and Exercise Pads: Yes: daily  INTERCOURSE: Pain with intercourse: Initial Penetration and During Penetration Ability to have vaginal penetration:  Yes: with pain Climax: non painful WNL   PREGNANCY: Vaginal deliveries 1 Tearing Yes: 3rd degree tear C-section deliveries 1 Currently  pregnant No  PROLAPSE: Periodically will feel vaginal bulging, heaviness in lower abdomen   OBJECTIVE:  11/09/22: No internal rectal or vaginal pelvic floor exam performed due to high level of skin irritation   Weak transversus abdominus contraction  Abdominal restriction and tenderness in bil lower quadrant  Unable to perform pelvic tilt with appropriate coordination  LUMBARAROM/PROM:  A/PROM A/PROM  Eval (% available) 11/09/22 (% available)  Flexion 50 90  Extension 25, pain anteriorly  25, pain in low back  Right lateral flexion 50, pain anteriorly  75, pain on right  Left lateral flexion 50, pain anteriorly  75, pain on right   (Blank rows = not tested) 07/21/22: standing prolapse assessment demonstrated grade 2 anterior vaginal wall laxity   06/08/22:               External Perineal Exam: WNL                              Internal Pelvic Floor: burning reported with palpation of superficial muscles; deep aching/pressure with palpation of Lt levator ani   Patient confirms identification and approves PT to assess internal pelvic floor and treatment Yes  PELVIC MMT:   MMT eval  Vaginal 3/5  Internal Anal Sphincter 1/5  External Anal Sphincter 2/5  Puborectalis 1/5  Diastasis Recti   (Blank rows = not tested)        TONE: High in Lt levator ani   PROLAPSE: Not able to tell this session due to dyssynergic pelvic floor contraction     06/01/22: COGNITION: Overall cognitive status: Within functional limits for tasks  assessed     SENSATION: Light touch: Appears intact Proprioception: Appears intact  GAIT: Comments: decreased hip extension, forward flexed trunk  POSTURE: rounded shoulders, forward head, decreased lumbar lordosis, increased thoracic kyphosis, posterior pelvic tilt, and flexed trunk    LUMBARAROM/PROM:  A/PROM A/PROM  Eval (% available)  Flexion 50  Extension 25, pain anteriorly   Right lateral flexion 50, pain anteriorly   Left lateral flexion 50, pain anteriorly    (Blank rows = not tested)   PALPATION:   General  Significant abdominal restriction and tenderness; decreased rib mobility with mobilization and breathing    TODAY'S TREATMENT:                                                                                                                              DATE:  02/07/23 Manual: Abdominal soft tissue mobilization  Bowel mobilization Ileocecal valve myofascial release Neuromuscular re-education: Transversus abdominus training with multimodal cues for improved motor control and breath coordination  Transversus abdominus isometrics 10x Bil supine UE ball press with transversus abdominus and pelvic floor muscle contractions and breath coordination 10x Supine hip adduction ball press with transversus abdominus and pelvic floor muscle contractions and breath coordination 10x Bridge with hip adduction, transversus abdominus, and pelvic floor muscle 2 x  10 Seated hip abduction red band with transversus abdominus and pelvic floor muscle 2 x 10 Seated resisted march red band with transversus abdominus and pelvic floor muscle 2 x 10  01/30/23 Manual: Abdominal soft tissue mobilization  Bowel mobilization Ileocecal valve myofascial release  01/18/23 Manual: Abdominal soft tissue mobilization  Bowel mobilization Ileocecal valve myofascial release Exercises: Seated forward fold 15 breaths Lateral side body stretch 10 breaths bil Open books 10x bil Lower trunk  rotation wide feet 10x    PATIENT EDUCATION:  Education details: See above Person educated: Patient Education method: Programmer, Multimedia, Demonstration, Tactile cues, Verbal cues, and Handouts Education comprehension: verbalized understanding  HOME EXERCISE PROGRAM: [id number]  ASSESSMENT:  CLINICAL IMPRESSION: Pt doing better this week with vulvar irritation and feeling like she is emptying more completely.We continued manual techniques to abdomen as she believes that have been very helpful. She is likely seeing progress in part due to avoiding cleanse on a regular basis and allowing normal bulk to build up in colon. She may also starting to see impact from new medications addressing any underlying autoimmune disease. Due to progress, we returned to core training today as this will help further progress with bladder control. She did very well, but had some difficulty with coordination of breathing with exercise and core activation. Good tolerance to all exercises. She will continue to benefit from skilled PT intervention in order to decrease pain, improve bowel movements, and decrease urinary urgency/incontinence.     OBJECTIVE IMPAIRMENTS: decreased activity tolerance, decreased coordination, decreased endurance, decreased mobility, decreased strength, increased fascial restrictions, increased muscle spasms, impaired tone, postural dysfunction, and pain.   ACTIVITY LIMITATIONS: lifting, bending, continence, and locomotion level  PARTICIPATION LIMITATIONS: interpersonal relationship, community activity, and occupation  PERSONAL FACTORS: 1 comorbidity: medical history  are also affecting patient's functional outcome.   REHAB POTENTIAL: Good  CLINICAL DECISION MAKING: Stable/uncomplicated  EVALUATION COMPLEXITY: Low   GOALS: Goals reviewed with patient? Yes  SHORT TERM GOALS: Target date: 07/06/22 - updated 07/12/22 - updated 08/18/22 - updated 09/22/22 - updated 10/26/22 - updated 11/09/22 -  updated 12/15/22 updated 01/18/23  Pt will be independent with HEP.   Baseline: Goal status: MET 07/12/22  2.  Pt will be independent with diaphragmatic breathing and down training activities in order to improve pelvic floor relaxation.  Baseline:  Goal status: MET 07/12/22  3.  Pt will be independent with the knack, urge suppression technique, and double voiding in order to improve bladder habits and decrease urinary incontinence.   Baseline:  Goal status: MET 09/22/22  4.  Pt will be independent with use of squatty potty, relaxed toileting mechanics, and improved bowel movement techniques in order to increase ease of bowel movements and complete evacuation.   Baseline:  Goal status: MET 11/09/22  5.  Pt will be able to correctly perform diaphragmatic breathing and appropriate pressure management in order to prevent worsening vaginal wall laxity and improve pelvic floor A/ROM.   Baseline:  Goal status: MET 01/18/23  LONG TERM GOALS: Target date: 11/16/2022 - updated 07/12/22 - updated 08/18/22 - updated 09/22/22 - updated 10/26/22 - updated 11/09/22 - updated 12/15/22 - updated 01/18/23  Pt will be independent with advanced HEP.   Baseline:  Goal status: IN PROGRESS 01/18/23  2.  Pt will demonstrate normal pelvic floor muscle tone and A/ROM, able to achieve 4/5 strength with contractions and 10 sec endurance, in order to provide appropriate lumbopelvic support in functional activities.   Baseline:  Goal status: IN PROGRESS  12/15/22  3.  Pt will increase all impaired lumbar A/ROM by 25% without pain.  Baseline:  Goal status:  IN PROGRESS 12/15/22  4.  Pt will report pain no higher than 3/10 with any activity. Baseline:  Goal status:  IN PROGRESS 12/15/22  5.  Pt will report 0/10 pain with vaginal penetration in order to improve intimate relationship with partner.    Baseline:  Goal status:  IN PROGRESS 12/15/22  6.  Pt will be able to go 2-3 hours in between voids without  urgency or incontinence in order to improve QOL and perform all functional activities with less difficulty.   Baseline:  Goal status: MET 01/18/23  7.  Pt will report no leaks with laughing, coughing, sneezing in order to improve comfort with interpersonal relationships and community activities.   Baseline:  Goal status:  IN PROGRESS 01/18/23  8.  Pt will have consistent bowel movements 4-5x/week without straining or pain.  Baseline:  Goal status:  MET 01/18/23  PLAN:  PT FREQUENCY: 1-2x/week  PT DURATION: 6 months  PLANNED INTERVENTIONS: Therapeutic exercises, Therapeutic activity, Neuromuscular re-education, Balance training, Gait training, Patient/Family education, Self Care, Joint mobilization, Dry Needling, Biofeedback, and Manual therapy  PLAN FOR NEXT SESSION: Continue to work on motor control activities surrounding pelvis; progress mobility; manual techniques as needed for comfort and to increase circulation.   Josette Mares, PT, DPT02/05/253:12 PM

## 2023-02-09 ENCOUNTER — Ambulatory Visit: Payer: Commercial Managed Care - PPO | Admitting: Occupational Therapy

## 2023-02-13 ENCOUNTER — Ambulatory Visit: Payer: Commercial Managed Care - PPO | Admitting: Podiatry

## 2023-02-13 ENCOUNTER — Telehealth: Payer: Self-pay | Admitting: Podiatry

## 2023-02-13 DIAGNOSIS — L600 Ingrowing nail: Secondary | ICD-10-CM | POA: Diagnosis not present

## 2023-02-13 NOTE — Telephone Encounter (Signed)
 Patient has questions regarding wearing compression hose on top of bandage. Contact telephone number, [phone]. Patient is requesting to speak with a nurse or provider as soon as possible, before she goes to work.

## 2023-02-13 NOTE — Patient Instructions (Signed)

## 2023-02-14 ENCOUNTER — Ambulatory Visit: Payer: Commercial Managed Care - PPO

## 2023-02-14 DIAGNOSIS — R279 Unspecified lack of coordination: Secondary | ICD-10-CM

## 2023-02-14 DIAGNOSIS — M6281 Muscle weakness (generalized): Secondary | ICD-10-CM

## 2023-02-14 DIAGNOSIS — M5459 Other low back pain: Secondary | ICD-10-CM | POA: Diagnosis not present

## 2023-02-14 DIAGNOSIS — R102 Pelvic and perineal pain unspecified side: Secondary | ICD-10-CM

## 2023-02-14 DIAGNOSIS — M62838 Other muscle spasm: Secondary | ICD-10-CM | POA: Diagnosis not present

## 2023-02-14 NOTE — Therapy (Signed)
OUTPATIENT PHYSICAL THERAPY FEMALE PELVIC TREATMENT   Patient Name: RAMONA RUARK MRN: [id number] DOB:28-Dec-1970, 53 y.o., female Today's Date: 02/14/2023  END OF SESSION:  PT End of Session - 02/14/23 1156     Visit Number 29    Date for PT Re-Evaluation 04/26/23    Authorization Type [employer]    PT Start Time 1154    PT Stop Time 1228    PT Time Calculation (min) 34 min    Activity Tolerance Patient tolerated treatment well    Behavior During Therapy [facility] for tasks assessed/performed                     Past Medical History:  Diagnosis Date   Adenomyosis    Anemia    Arthritis 2013   L knee gets steroid injections    Asthmatic bronchitis 01/31/2017   Back pain    Chronic constipation    Diarrhea    DVT of lower extremity (deep venous thrombosis) (HCC)    Dysmenorrhea    Endometriosis    Esophageal stricture    GERD (gastroesophageal reflux disease)    History of kidney stones    History of uterine fibroid    IBS (irritable bowel syndrome)    Interstitial cystitis    Lactose intolerance    Migraine    with aura   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   PONV (postoperative nausea and vomiting)    Recurrent upper respiratory infection (URI)    Sebaceous cyst of breast, right lower inner quadrant 10/25/2012   Excised 11/21/12    Sleep apnea    Syncope, non cardiac    Past Surgical History:  Procedure Laterality Date   ARTHROSCOPIC HAGLUNDS REPAIR     BREAST CYST EXCISION Right 11/21/2012   Procedure: CYST EXCISION BREAST;  Surgeon: Currie Paris, MD;  Location: [facility];  Service: General;  Laterality: Right;   BREAST CYST EXCISION Bilateral 01/18/2022   Procedure: EXCISION OF SEBACEOUS CYST BILATERAL BREAST;  Surgeon: Harriette Bouillon, MD;  Location: [facility];  Service: General;  Laterality: Bilateral;   BREAST EXCISIONAL BIOPSY Right 11/2012   CESAREAN SECTION  04/19/1993   CHOLECYSTECTOMY      COLONOSCOPY  05/02/2011   Procedure: COLONOSCOPY;  Surgeon: Vertell Novak., MD;  Location: [facility];  Service: Endoscopy;  Laterality: N/A;   colonoscopy  11/04/2014   DENTAL SURGERY  03/06/2018   2 surgeries ( 03/06/2018 and 04/06/2018)   to remove two separate benign tumors   ESOPHAGEAL MANOMETRY N/A 11/04/2015   Procedure: ESOPHAGEAL MANOMETRY (EM);  Surgeon: Carman Ching, MD;  Location: [facility];  Service: Endoscopy;  Laterality: N/A;   ESOPHAGOGASTRODUODENOSCOPY  05/02/2011   Procedure: ESOPHAGOGASTRODUODENOSCOPY (EGD);  Surgeon: Vertell Novak., MD;  Location: [facility];  Service: Endoscopy;  Laterality: N/A;   ESOPHAGOGASTRODUODENOSCOPY N/A 09/01/2014   Procedure: ESOPHAGOGASTRODUODENOSCOPY (EGD);  Surgeon: Carman Ching, MD;  Location: [facility];  Service: Endoscopy;  Laterality: N/A;   KNEE SURGERY     LAPAROSCOPY     x 4   LESION REMOVAL N/A 01/18/2022   Procedure: EXCISION OF SEBACEOUS CYSTS CHEST AND NECK;  Surgeon: Harriette Bouillon, MD;  Location:  S[facility]ERY [facility];  Service: General;  Laterality: N/A;   PH IMPEDANCE STUDY N/A 11/04/2015   Procedure: PH IMPEDANCE STUDY;  Surgeon: Carman Ching, MD;  Location: [facility];  Service: Endoscopy;  Laterality: N/A;   VAGINAL HYSTERECTOMY  2010   TVH--ovaries remain  Patient Active Problem List   Diagnosis Date Noted   Sjogren syndrome with keratoconjunctivitis (HCC) 12/09/2022   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   Elevated LFTs 04/04/2021   Colitis 04/04/2021   Bacterial overgrowth syndrome 05/21/2020   Obesity 05/21/2020   History of endometriosis 05/21/2020   Constipation due to outlet dysfunction 02/05/2020   Gastroesophageal reflux disease 02/05/2020   Obstructive sleep apnea treated with continuous positive airway pressure (CPAP) 06/16/2017   Perennial allergic rhinitis with a nonallergic component 01/31/2017   Asthmatic bronchitis 01/31/2017   GI symptoms 01/31/2017   Family history of  colon cancer 01/27/2015   Chest pain 12/13/2012   Dyspnea 12/13/2012   DVT of lower extremity (deep venous thrombosis) (HCC) 01/03/1990    PCP: Lorenda Ishihara, MD  REFERRING PROVIDER: Jerene Bears, MD   REFERRING DIAG: M62.89 (ICD-10-CM) - Pelvic floor dysfunction  THERAPY DIAG:  Pelvic pain  Other low back pain  Muscle weakness (generalized)  Other muscle spasm  Unspecified lack of coordination  Rationale for Evaluation and Treatment: Rehabilitation  ONSET DATE: chronic  SUBJECTIVE:                                                                                                                                                                                           SUBJECTIVE STATEMENT:  Pt has continued walking on a regular basis. She did not do a cleanse over the weekend even though she felt like she needed to.  PAIN: 02/07/23 Are you having pain? Yes NPRS scale: 2/10 Pain location:  Lower abdominal pain, Lt sided pain, low back pain  Pain type: turning, knotted, pressure Pain description: intermittent and constant   Aggravating factors: constipation or frequent bowel movements/constantly having bowel movements  Relieving factors: exercises (bowel massage, happy baby, spinal twists, walking)  PRECAUTIONS: None  WEIGHT BEARING RESTRICTIONS: No  FALLS:  Has patient fallen in last 6 months? No  LIVING ENVIRONMENT: Lives with: lives with their spouse Lives in: House/apartment  OCCUPATION: nurse, in admin  PLOF: Independent  PATIENT GOALS: decrease pain and have more normal bowel movements  PERTINENT HISTORY:  Vaginal hysterectomy, DVT LE, endometriosis, interstitial cystitis, exploratory laps for endometriosis, c-section, cholecystectomy Sexual abuse: No  BOWEL MOVEMENT: Pain with bowel movement: Yes Type of bowel movement:Type (Bristol Stool Scale) 1-7 (sometimes full range in the same bowel movement), Frequency sometimes many times a day,  sometimes up to 4 days in between, and Strain Yes Fully empty rectum: No Leakage: No Pads: No Fiber supplement: No - has attempted metamucil and it made constipation worse  URINATION: Pain with urination: Yes Fully empty bladder: No Stream:  varies  Urgency: Yes: all the time Frequency: multiple times an hours  Leakage: Urge to void, Walking to the bathroom, Coughing, Sneezing, Laughing, and Exercise Pads: Yes: daily  INTERCOURSE: Pain with intercourse: Initial Penetration and During Penetration Ability to have vaginal penetration:  Yes: with pain Climax: non painful WNL   PREGNANCY: Vaginal deliveries 1 Tearing Yes: 3rd degree tear C-section deliveries 1 Currently pregnant No  PROLAPSE: Periodically will feel vaginal bulging, heaviness in lower abdomen   OBJECTIVE:  11/09/22: No internal rectal or vaginal pelvic floor exam performed due to high level of skin irritation   Weak transversus abdominus contraction  Abdominal restriction and tenderness in bil lower quadrant  Unable to perform pelvic tilt with appropriate coordination  LUMBARAROM/PROM:  A/PROM A/PROM  Eval (% available) 11/09/22 (% available)  Flexion 50 90  Extension 25, pain anteriorly  25, pain in low back  Right lateral flexion 50, pain anteriorly  75, pain on right  Left lateral flexion 50, pain anteriorly  75, pain on right   (Blank rows = not tested) 07/21/22: standing prolapse assessment demonstrated grade 2 anterior vaginal wall laxity   06/08/22:               External Perineal Exam: WNL                              Internal Pelvic Floor: burning reported with palpation of superficial muscles; deep aching/pressure with palpation of Lt levator ani   Patient confirms identification and approves PT to assess internal pelvic floor and treatment Yes  PELVIC MMT:   MMT eval  Vaginal 3/5  Internal Anal Sphincter 1/5  External Anal Sphincter 2/5  Puborectalis 1/5  Diastasis Recti   (Blank rows  = not tested)        TONE: High in Lt levator ani   PROLAPSE: Not able to tell this session due to dyssynergic pelvic floor contraction     06/01/22: COGNITION: Overall cognitive status: Within functional limits for tasks assessed     SENSATION: Light touch: Appears intact Proprioception: Appears intact  GAIT: Comments: decreased hip extension, forward flexed trunk  POSTURE: rounded shoulders, forward head, decreased lumbar lordosis, increased thoracic kyphosis, posterior pelvic tilt, and flexed trunk    LUMBARAROM/PROM:  A/PROM A/PROM  Eval (% available)  Flexion 50  Extension 25, pain anteriorly   Right lateral flexion 50, pain anteriorly   Left lateral flexion 50, pain anteriorly    (Blank rows = not tested)   PALPATION:   General  Significant abdominal restriction and tenderness; decreased rib mobility with mobilization and breathing    TODAY'S TREATMENT:                                                                                                                              DATE:  02/14/23 Manual: Abdominal soft tissue mobilization  Bowel mobilization Ileocecal valve myofascial release Neuromuscular  re-education: Bil supine UE ball press with transversus abdominus and pelvic floor muscle contractions and breath coordination 10x Supine hip adduction ball press with transversus abdominus and pelvic floor muscle contractions and breath coordination 10x Bridge with hip adduction, transversus abdominus, and pelvic floor muscle 2 x 10 Supine leg extensions with transversus abdominus activation 10x bil   02/07/23 Manual: Abdominal soft tissue mobilization  Bowel mobilization Ileocecal valve myofascial release Neuromuscular re-education: Transversus abdominus training with multimodal cues for improved motor control and breath coordination  Transversus abdominus isometrics 10x Bil supine UE ball press with transversus abdominus and pelvic floor muscle  contractions and breath coordination 10x Supine hip adduction ball press with transversus abdominus and pelvic floor muscle contractions and breath coordination 10x Bridge with hip adduction, transversus abdominus, and pelvic floor muscle 2 x 10 Seated hip abduction red band with transversus abdominus and pelvic floor muscle 2 x 10 Seated resisted march red band with transversus abdominus and pelvic floor muscle 2 x 10  01/30/23 Manual: Abdominal soft tissue mobilization  Bowel mobilization Ileocecal valve myofascial release   PATIENT EDUCATION:  Education details: See above Person educated: Patient Education method: Programmer, multimedia, Demonstration, Tactile cues, Verbal cues, and Handouts Education comprehension: verbalized understanding  HOME EXERCISE PROGRAM: [id number]  ASSESSMENT:  CLINICAL IMPRESSION: Pt continuing to see progress with vaginal irritation, productivity with bowel movements, and improved bladder control. We continued manual techniques to abdomen with good tolerance. She is doing well with core and pelvic floor muscle contraction with other exercises, reviewing exercises from previous visit. She states that these exercises are going well at home. She will continue to benefit from skilled PT intervention in order to decrease pain, improve bowel movements, and decrease urinary urgency/incontinence.     OBJECTIVE IMPAIRMENTS: decreased activity tolerance, decreased coordination, decreased endurance, decreased mobility, decreased strength, increased fascial restrictions, increased muscle spasms, impaired tone, postural dysfunction, and pain.   ACTIVITY LIMITATIONS: lifting, bending, continence, and locomotion level  PARTICIPATION LIMITATIONS: interpersonal relationship, community activity, and occupation  PERSONAL FACTORS: 1 comorbidity: medical history  are also affecting patient's functional outcome.   REHAB POTENTIAL: Good  CLINICAL DECISION MAKING:  Stable/uncomplicated  EVALUATION COMPLEXITY: Low   GOALS: Goals reviewed with patient? Yes  SHORT TERM GOALS: Target date: 07/06/22 - updated 07/12/22 - updated 08/18/22 - updated 09/22/22 - updated 10/26/22 - updated 11/09/22 - updated 12/15/22 updated 01/18/23  Pt will be independent with HEP.   Baseline: Goal status: MET 07/12/22  2.  Pt will be independent with diaphragmatic breathing and down training activities in order to improve pelvic floor relaxation.  Baseline:  Goal status: MET 07/12/22  3.  Pt will be independent with the knack, urge suppression technique, and double voiding in order to improve bladder habits and decrease urinary incontinence.   Baseline:  Goal status: MET 09/22/22  4.  Pt will be independent with use of squatty potty, relaxed toileting mechanics, and improved bowel movement techniques in order to increase ease of bowel movements and complete evacuation.   Baseline:  Goal status: MET 11/09/22  5.  Pt will be able to correctly perform diaphragmatic breathing and appropriate pressure management in order to prevent worsening vaginal wall laxity and improve pelvic floor A/ROM.   Baseline:  Goal status: MET 01/18/23  LONG TERM GOALS: Target date: 11/16/2022 - updated 07/12/22 - updated 08/18/22 - updated 09/22/22 - updated 10/26/22 - updated 11/09/22 - updated 12/15/22 - updated 01/18/23  Pt will be independent with advanced HEP.   Baseline:  Goal  status: IN PROGRESS 01/18/23  2.  Pt will demonstrate normal pelvic floor muscle tone and A/ROM, able to achieve 4/5 strength with contractions and 10 sec endurance, in order to provide appropriate lumbopelvic support in functional activities.   Baseline:  Goal status: IN PROGRESS 12/15/22  3.  Pt will increase all impaired lumbar A/ROM by 25% without pain.  Baseline:  Goal status:  IN PROGRESS 12/15/22  4.  Pt will report pain no higher than 3/10 with any activity. Baseline:  Goal status:  IN PROGRESS 12/15/22  5.   Pt will report 0/10 pain with vaginal penetration in order to improve intimate relationship with partner.    Baseline:  Goal status:  IN PROGRESS 12/15/22  6.  Pt will be able to go 2-3 hours in between voids without urgency or incontinence in order to improve QOL and perform all functional activities with less difficulty.   Baseline:  Goal status: MET 01/18/23  7.  Pt will report no leaks with laughing, coughing, sneezing in order to improve comfort with interpersonal relationships and community activities.   Baseline:  Goal status:  IN PROGRESS 01/18/23  8.  Pt will have consistent bowel movements 4-5x/week without straining or pain.  Baseline:  Goal status:  MET 01/18/23  PLAN:  PT FREQUENCY: 1-2x/week  PT DURATION: 6 months  PLANNED INTERVENTIONS: Therapeutic exercises, Therapeutic activity, Neuromuscular re-education, Balance training, Gait training, Patient/Family education, Self Care, Joint mobilization, Dry Needling, Biofeedback, and Manual therapy  PLAN FOR NEXT SESSION: Continue to work on motor control activities surrounding pelvis; progress mobility; manual techniques as needed for comfort and to increase circulation.   Julio Alm, PT, DPT02/11/2510:23 PM

## 2023-02-14 NOTE — Progress Notes (Signed)
Subjective:   Patient ID: Karen [name], female   DOB: 53 y.o.   MRN: [id number]   HPI patient presents stating she has had a painful toenail right big toe that she soaked and tried to trim without relief of symptoms      ROS      Objective:  Physical Exam  Neuro vascular status intact incurvated border right hallux medial painful when pressed inability to wear shoe gear without difficulty     Assessment:  Chronic ingrown toenail deformity right hallux medial border that is painful     Plan:  H&P reviewed and I have recommended removal of the border explaining procedure risk and allowed her to read signed consent form.  I infiltrated the right big toe 60 mg like Marcaine mixture sterile prep done using sterile instrumentation remove the border exposed matrix applied phenol 3 applications 30 seconds followed by alcohol lavage sterile dressing gave instructions on soaks wear dressing 24 hours take it off earlier if any issues were to occur and encouraged her to call questions concerns which may arise

## 2023-02-16 ENCOUNTER — Ambulatory Visit: Payer: Commercial Managed Care - PPO | Admitting: Occupational Therapy

## 2023-02-16 ENCOUNTER — Encounter: Payer: Self-pay | Admitting: Hematology & Oncology

## 2023-02-16 ENCOUNTER — Encounter: Payer: Self-pay | Admitting: Allergy & Immunology

## 2023-02-16 ENCOUNTER — Other Ambulatory Visit (HOSPITAL_COMMUNITY): Payer: Self-pay

## 2023-02-16 DIAGNOSIS — I890 Lymphedema, not elsewhere classified: Secondary | ICD-10-CM

## 2023-02-16 NOTE — Therapy (Signed)
OUTPATIENT OCCUPATIONAL THERAPY TREATMENT NOTE  L LOWER EXTREMITY/ B LOWER QUADRANT LYMPHEDEMA  Patient Name: Karen [name] MRN: [id number] DOB:Feb 18, 1970, 53 y.o., female Today's Date: 02/16/2023  END OF SESSION:   OT End of Session - 02/16/23 1124     Visit Number 22    Number of Visits 36    Date for OT Re-Evaluation 05/17/23    OT Start Time 0805    OT Stop Time 0905    OT Time Calculation (min) 60 min    Activity Tolerance Patient tolerated treatment well;No increased pain    Behavior During Therapy [facility] for tasks assessed/performed               Past Medical History:  Diagnosis Date   Adenomyosis    Anemia    Arthritis 2013   L knee gets steroid injections    Asthmatic bronchitis 01/31/2017   Back pain    Chronic constipation    Diarrhea    DVT of lower extremity (deep venous thrombosis) (HCC)    Dysmenorrhea    Endometriosis    Esophageal stricture    GERD (gastroesophageal reflux disease)    History of kidney stones    History of uterine fibroid    IBS (irritable bowel syndrome)    Interstitial cystitis    Lactose intolerance    Migraine    with aura   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   PONV (postoperative nausea and vomiting)    Recurrent upper respiratory infection (URI)    Sebaceous cyst of breast, right lower inner quadrant 10/25/2012   Excised 11/21/12    Sleep apnea    Syncope, non cardiac    Past Surgical History:  Procedure Laterality Date   ARTHROSCOPIC HAGLUNDS REPAIR     BREAST CYST EXCISION Right 11/21/2012   Procedure: CYST EXCISION BREAST;  Surgeon: Currie Paris, MD;  Location: [facility];  Service: General;  Laterality: Right;   BREAST CYST EXCISION Bilateral 01/18/2022   Procedure: EXCISION OF SEBACEOUS CYST BILATERAL BREAST;  Surgeon: Harriette Bouillon, MD;  Location: [facility];  Service: General;  Laterality: Bilateral;   BREAST EXCISIONAL BIOPSY Right 11/2012   CESAREAN SECTION  04/19/1993    CHOLECYSTECTOMY     COLONOSCOPY  05/02/2011   Procedure: COLONOSCOPY;  Surgeon: Vertell Novak., MD;  Location: [facility];  Service: Endoscopy;  Laterality: N/A;   colonoscopy  11/04/2014   DENTAL SURGERY  03/06/2018   2 surgeries ( 03/06/2018 and 04/06/2018)   to remove two separate benign tumors   ESOPHAGEAL MANOMETRY N/A 11/04/2015   Procedure: ESOPHAGEAL MANOMETRY (EM);  Surgeon: Carman Ching, MD;  Location: [facility];  Service: Endoscopy;  Laterality: N/A;   ESOPHAGOGASTRODUODENOSCOPY  05/02/2011   Procedure: ESOPHAGOGASTRODUODENOSCOPY (EGD);  Surgeon: Vertell Novak., MD;  Location: [facility];  Service: Endoscopy;  Laterality: N/A;   ESOPHAGOGASTRODUODENOSCOPY N/A 09/01/2014   Procedure: ESOPHAGOGASTRODUODENOSCOPY (EGD);  Surgeon: Carman Ching, MD;  Location: [facility];  Service: Endoscopy;  Laterality: N/A;   KNEE SURGERY     LAPAROSCOPY     x 4   LESION REMOVAL N/A 01/18/2022   Procedure: EXCISION OF SEBACEOUS CYSTS CHEST AND NECK;  Surgeon: Harriette Bouillon, MD;  Location: [facility];  Service: General;  Laterality: N/A;   PH IMPEDANCE STUDY N/A 11/04/2015   Procedure: PH IMPEDANCE STUDY;  Surgeon: Carman Ching, MD;  Location: [facility];  Service: Endoscopy;  Laterality: N/A;   VAGINAL HYSTERECTOMY  2010   TVH--ovaries  remain   Patient Active Problem List   Diagnosis Date Noted   Sjogren syndrome with keratoconjunctivitis (HCC) 12/09/2022   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   Elevated LFTs 04/04/2021   Colitis 04/04/2021   Bacterial overgrowth syndrome 05/21/2020   Obesity 05/21/2020   History of endometriosis 05/21/2020   Constipation due to outlet dysfunction 02/05/2020   Gastroesophageal reflux disease 02/05/2020   Obstructive sleep apnea treated with continuous positive airway pressure (CPAP) 06/16/2017   Perennial allergic rhinitis with a nonallergic component 01/31/2017   Asthmatic bronchitis 01/31/2017   GI symptoms 01/31/2017    Family history of colon cancer 01/27/2015   Chest pain 12/13/2012   Dyspnea 12/13/2012   DVT of lower extremity (deep venous thrombosis) (HCC) 01/03/1990    PCP: Coral Else, MD  REFERRING PROVIDER: Lorenda Ishihara, MD  REFERRING DIAG: I89.0  THERAPY DIAG:  Lymphedema, not elsewhere classified  Rationale for Evaluation and Treatment: Rehabilitation  ONSET DATE: ~2014; exacerbation in 2023  SUBJECTIVE:                                                                                                                                                                                           SUBJECTIVE STATEMENT: Pt presents for OT to address lower quadrant and LLE lymphedema. Pt rates LE-related pain in her abdomen as 3/10 this morning. Pt states she continues walking for exercise on a regular basis. She states she plans to do a cleans over the weekend  in an effort to keep her bowels moving regularly and to reduce constipation discomfort.   PERTINENT HISTORY: CVI, OSA (no CPAP), GERD, Esophageal stricture, IBS, Lactose intolerant, Diarrhea, Hx LE DVT  PAIN:  Are you having pain? Yes: NPRS scale: 3/10 Pain location: BLE Pain description: myalgia, tight, heavy, tingling, numbness Aggravating factors: standing, walking, dependent sitting > 30 minutes Relieving factors: elevation  PRECAUTIONS: Other: LYMPHEDEMA; Hx LLE DVT  RED FLAGS: Hx LLE DVT  WEIGHT BEARING RESTRICTIONS: No  FALLS:  Has patient fallen in last 6 months? No  LIVING ENVIRONMENT: Lives with: lives with spouse and daughter Lives in: House/apartment Stairs: Yes; Internal: 14 steps; yes and External: 4 steps; yes Has following equipment at home: None  OCCUPATION: [employer] operational officer, walking, desk work  LEISURE: family time  HAND DOMINANCE: right   PRIOR LEVEL OF FUNCTION: Independent  PATIENT GOALS: Feel better, be able to be more active without pain, wear preferred street  shoes  OBJECTIVE: Note: Objective measures were completed at Evaluation unless otherwise noted.  COGNITION:  Overall cognitive status: Within functional limits for tasks assessed   OBSERVATIONS / OTHER ASSESSMENTS:   POSTURE: [facility]  LE ROM: WFL  LE MMT: WFL  LYMPHEDEMA ASSESSMENTS: non-Ca related  INFECTIONS: denies cellulitis and wound hx  BLE COMPARATIVE LIMB VOLUMETRICS Initial 11/15/22  LANDMARK RIGHT (dominant)  R LEG (A-D) 3028.9 ml  R THIGH (E-G) 4809.60 ml  R FULL LIMB (A-G) 7838.5 ml  Limb Volume differential (LVD)  %  Volume change since initial %  Volume change overall V  (Blank rows = not tested)  LANDMARK LEFT  (Rx)  L LEG (A-D) 3189.9 ml  L THIGH (E-G) 4959.8 ml  L FULL LIMB (A-G) 8149.7 ml  Limb Volume differential (LVD)  LEG LVD = 5.32%, L>R; THIGH LVD = 3.12%, L>R; And full LLE LVD = 3.97%, L>R. 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 Obesity  Skin  Description Hyper-Keratosis Peau d' Orange Shiny Tight Fibrotic/ Indurated Fatty Doughy Spongy/ boggy       R>L x  x   Skin dry Flaky WNL Macerated   mildly      Color Redness Varicosities Blanching Hemosiderin Stain Mottled        x   Odor Malodorous Yeast Fungal infection  WNL      x   Temperature Warm Cool wnl     x    Pitting Edema   1+ 2+ 3+ 4+ Non-pitting         x   Girth Symmetrical Asymmetrical                   Distribution    L>R toes to groin    Stemmer Sign Positive Negative   +L -R   Lymphorrhea History Of:  Present Absent     x    Wounds History Of Present Absent Venous Arterial Pressure Sheer     x        Signs of Infection Redness Warmth Erythema Acute Swelling Drainage Borders                    Sensation Light Touch Deep pressure Hypersensitivity   In tact Impaired In tact Impaired Absent Impaired   x  x  x     Nails WNL   Fungus nail  dystrophy   x     Hair Growth Symmetrical Asymmetrical   x    Skin Creases Base of toes  Ankles   Base of Fingers knees       Abdominal pannus Thigh Lobules  Face/neck   x           GAIT: Distance walked: >500' Assistive device utilized: None Level of assistance: Complete Independence Comments: Functional ambulation and transfers [facility]  LYMPHEDEMA LIFE IMPACT SCALE (LLIS): Initial 11/15/22  50%  FOTO functional outcome measure: Deferred. FOTO discontinued.  TODAY'S TREATMENT:  Pt edu re lymphedema self management- simple self MLD MLD to BLQ emphasis on colon and LLE  PATIENT EDUCATION:  Continued Pt/ CG edu for how function of cisterna chyli, part of the thoracic duct of deep abdominal lymphatics, assists with digestion by filtering many of the fats and proteins from the digestive system. Sub optimal lymphatic flow in this region may result in constipation, bloating, swelling, etc. MLD helps mobilize these protein and fats , and the rhythmic movement of manual lymphatic drainage stimulates digestive muscles and increase peristalsis. Provided printed resource for reference.  Topics include outcome of comparative limb volumetrics- starting limb volume differentials (LVDs), technology and gradient techniques used for short stretch, multilayer compression wrapping, simple self-MLD, therapeutic lymphatic pumping exercises, skin/nail care, LE precautions,. compression garment recommendations and specifications, wear and care schedule and compression garment donning / doffing w assistive devices. Discussed progress towards all OT goals since commencing CDT. All questions answered to the Pt's satisfaction. Good return. Person educated: Patient  Education method: Explanation, Demonstration, and Handouts Education comprehension: verbalized understanding, returned  demonstration, verbal cues required, and needs further education  HOME EXERCISE PROGRAM: BLE lymphatic pumping there ex using- 1 sets of 10 reps, each exercise in order-  1-2 x daily, bilaterally Simple self MLD 1 x daily Daily skin care to increase hydration, skin mobility and decrease infection risk- can be done during MLD Compression wraps 23/7 during intensive phase of CDT Compression garments/ devices during self-management phase of CDT  ASSESSMENT:  CLINICAL IMPRESSION: Continued lymphedema self care home program training w emphasis on simple self-MLD. Pt mastered J stroke and short neck sequence by end of session. Pt tolerated abdominal MLD and MLD to LLE/LLQ today without increased pain. Pt continues to make steady progress towards all goals. Cont as per POC.  11/15/22 Initial Evaluation: Hiroko Tregre is a 53 y.o. female presenting with very mild, stage II, LLE lymphedema 2/2 venous insufficiency and obesity. L Leg swelling and associated pain swelling fluctuates. It has progressed over time, and now no longer resolves with elevation over night. RLE swelling limits balance during functional ambulation. It exacerbates infection and falls risk. LLE/RLQ lymphedema interferes with functional performance in all occupational domains, including basic and instrumental ADLs, productive activities, leisure pursuits and social participation. Pt will benefit from Occupational Therapy for Complete Decongestive Therapy (CDT) to restore function, to reduce physical and psychologic suffering associated with chronic, progressive lymphedema and associated pain, and to limit infection. CDT will include manual lymphatic drainage (MLD), skin care, therapeutic exercise and compression wraps and garment's devices. Without skilled OT for lymphedema care the condition will worsen and further functional decline is expected  Custom-made gradient compression garments and HOS devices are medically necessary because they  are uniquely sized and shaped to fit the exact dimensions of the affected extremities, and to provide appropriate medical grade, graduated compression essential for optimally managing chronic, progressive lymphedema. Multiple custom compression garments are needed to ensure proper hygiene to limit infection risk. Custom compression garments should be replaced q 3-6 months When worn consistently for optimal lipo-lymphedema self-management over time. HOS devices, medically necessary to limit fibrosis buildup in tissue, should be replaced q 2 years and PRN when worn out.      OBJECTIVE IMPAIRMENTS: decreased activity tolerance, decreased knowledge of condition, decreased knowledge of use of DME, increased edema, impaired sensation, pain, and chronic, progressive leg swelling .   ACTIVITY LIMITATIONS: dependent sitting, extended standing and/ or walking, squatting, and lower body dressing, fitting preferred street shoes  PARTICIPATION LIMITATIONS: shopping, community activity, occupation, and yard work  PERSONAL FACTORS: 3+ comorbidities: Hx DVT,  OSA, CVI. Varicose veins  are also affecting patient's functional outcome.   REHAB POTENTIAL: Good  EVALUATION COMPLEXITY: Moderate   GOALS: Goals reviewed with patient? Yes  SHORT TERM GOALS: Target date: 4th OT Rx visit  SHORT TERM GOALS: Target date: 4th OT Rx visit   Pt will demonstrate understanding of lymphedema precautions and prevention strategies with modified independence using a printed reference to identify at least 5 precautions and discussing how s/he may implement them into daily life to reduce risk of progression with extra time. Baseline:Max A Goal status: GOAL MET   Pt will be able to apply multilayer, knee length, gradient, compression wraps to one leg at a time with modified assistance (extra time and assistive device/s) to decrease limb volume, to limit infection risk, and to limit lymphedema progression.  Baseline:  Dependent Goal status: GOAL DISCHARGED. Pt Going to compression garments instead  LONG TERM GOALS: Target date: 02/08/23  Given this patient's Intake score of 67%/100% on the functional outcomes FOTO tool, patient will experience an increase in function of 5 points to improve basic and instrumental ADLs performance, including lymphedema self-care.  Baseline: Max A Goal status:GOAL DISCHARGED.  FOTO TOOL DISCONTINUED AT CLINIC   Given this patient's Intake score of 50% on the Lymphedema Life Impact Scale (LLIS), patient will experience a reduction of at least 5 points in her perceived level of functional impairment resulting from lymphedema to improve functional performance and quality of life (QOL). Baseline: % Goal status: PROGRESSING  Pt will achieve at least a 10% volume reduction in B legs to return limb to typical size and shape, to limit infection risk and LE progression, to decrease pain, to improve function. Baseline: Dependent Goal status: PROGRESSING  4.  Pt will obtain proper compression garments/devices and achieve modified independence (extra time + assistive devices) with donning/doffing to optimize limb volume reductions and limit LE  progression over time. Baseline:  Goal status: ONGOING. Garments  5.  During Intensive phase CDT , with modified independence, Pt will achieve at least 85% compliance with all lymphedema self-care home program components, including daily skin care, compression wraps and /or garments, simple self MLD and lymphatic pumping therex to habituate LE self care protocol  into ADLs for optimal LE self-management over time. Baseline: Dependent Goal status: PROGRESSING   PLAN:  OT FREQUENCY: 2x/week  OT DURATION: 12 weeks  PLANNED INTERVENTIONS: 97110-Therapeutic exercises, 97530- Therapeutic activity, 97535- Self Care, 16109- Manual therapy, Patient/Family education, Manual lymph drainage, Compression bandaging, and fit with appropriate compression  garments  PLAN FOR NEXT SESSION:  Cont MLD Cont Pt edu for LE self-care  Loel Dubonnet, MS, OTR/L, CLT-LANA 02/16/23 11:26 AM '

## 2023-02-16 NOTE — Therapy (Signed)
OUTPATIENT PHYSICAL THERAPY FEMALE PELVIC TREATMENT   Patient Name: Karen [name] MRN: [id number] DOB:[birth year], 53 y.o., female Today's Date: 02/16/2023  END OF SESSION:            Past Medical History:  Diagnosis Date   Adenomyosis    Anemia    Arthritis 2013   L knee gets steroid injections    Asthmatic bronchitis 01/31/2017   Back pain    Chronic constipation    Diarrhea    DVT of lower extremity (deep venous thrombosis) (HCC)    Dysmenorrhea    Endometriosis    Esophageal stricture    GERD (gastroesophageal reflux disease)    History of kidney stones    History of uterine fibroid    IBS (irritable bowel syndrome)    Interstitial cystitis    Lactose intolerance    Migraine    with aura   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   PONV (postoperative nausea and vomiting)    Recurrent upper respiratory infection (URI)    Sebaceous cyst of breast, right lower inner quadrant 10/25/2012   Excised 11/21/12    Sleep apnea    Syncope, non cardiac    Past Surgical History:  Procedure Laterality Date   ARTHROSCOPIC HAGLUNDS REPAIR     BREAST CYST EXCISION Right 11/21/2012   Procedure: CYST EXCISION BREAST;  Surgeon: Currie Paris, MD;  Location: [facility];  Service: General;  Laterality: Right;   BREAST CYST EXCISION Bilateral 01/18/2022   Procedure: EXCISION OF SEBACEOUS CYST BILATERAL BREAST;  Surgeon: Harriette Bouillon, MD;  Location: [facility];  Service: General;  Laterality: Bilateral;   BREAST EXCISIONAL BIOPSY Right 11/2012   CESAREAN SECTION  04/19/1993   CHOLECYSTECTOMY     COLONOSCOPY  05/02/2011   Procedure: COLONOSCOPY;  Surgeon: Vertell Novak., MD;  Location: [facility];  Service: Endoscopy;  Laterality: N/A;   colonoscopy  11/04/2014   DENTAL SURGERY  03/06/2018   2 surgeries ( 03/06/2018 and 04/06/2018)   to remove two separate benign tumors   ESOPHAGEAL MANOMETRY N/A 11/04/2015   Procedure: ESOPHAGEAL  MANOMETRY (EM);  Surgeon: Carman Ching, MD;  Location: [facility];  Service: Endoscopy;  Laterality: N/A;   ESOPHAGOGASTRODUODENOSCOPY  05/02/2011   Procedure: ESOPHAGOGASTRODUODENOSCOPY (EGD);  Surgeon: Vertell Novak., MD;  Location: [facility];  Service: Endoscopy;  Laterality: N/A;   ESOPHAGOGASTRODUODENOSCOPY N/A 09/01/2014   Procedure: ESOPHAGOGASTRODUODENOSCOPY (EGD);  Surgeon: Carman Ching, MD;  Location: [facility];  Service: Endoscopy;  Laterality: N/A;   KNEE SURGERY     LAPAROSCOPY     x 4   LESION REMOVAL N/A 01/18/2022   Procedure: EXCISION OF SEBACEOUS CYSTS CHEST AND NECK;  Surgeon: Harriette Bouillon, MD;  Location: [facility];  Service: General;  Laterality: N/A;   PH IMPEDANCE STUDY N/A 11/04/2015   Procedure: PH IMPEDANCE STUDY;  Surgeon: Carman Ching, MD;  Location: [facility];  Service: Endoscopy;  Laterality: N/A;   VAGINAL HYSTERECTOMY  2010   TVH--ovaries remain   Patient Active Problem List   Diagnosis Date Noted   Sjogren syndrome with keratoconjunctivitis (HCC) 12/09/2022   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   Elevated LFTs 04/04/2021   Colitis 04/04/2021   Bacterial overgrowth syndrome 05/21/2020   Obesity 05/21/2020   History of endometriosis 05/21/2020   Constipation due to outlet dysfunction 02/05/2020   Gastroesophageal reflux disease 02/05/2020   Obstructive sleep apnea treated with continuous positive airway pressure (CPAP) 06/16/2017   Perennial allergic rhinitis with a  nonallergic component 01/31/2017   Asthmatic bronchitis 01/31/2017   GI symptoms 01/31/2017   Family history of colon cancer 01/27/2015   Chest pain 12/13/2012   Dyspnea 12/13/2012   DVT of lower extremity (deep venous thrombosis) (HCC) 01/03/1990    PCP: Lorenda Ishihara, MD  REFERRING PROVIDER: Jerene Bears, MD   REFERRING DIAG: [phone] (ICD-10-CM) - Pelvic floor dysfunction  THERAPY DIAG:  Lymphedema, not elsewhere classified  Rationale  for Evaluation and Treatment: Rehabilitation  ONSET DATE: chronic  SUBJECTIVE:                                                                                                                                                                                           SUBJECTIVE STATEMENT:  Pt has continued walking on a regular basis. She did not do a cleanse over the weekend even though she felt like she needed to.  PAIN: 02/07/23 Are you having pain? Yes NPRS scale: 2/10 Pain location:  Lower abdominal pain, Lt sided pain, low back pain  Pain type: turning, knotted, pressure Pain description: intermittent and constant   Aggravating factors: constipation or frequent bowel movements/constantly having bowel movements  Relieving factors: exercises (bowel massage, happy baby, spinal twists, walking)  PRECAUTIONS: None  WEIGHT BEARING RESTRICTIONS: No  FALLS:  Has patient fallen in last 6 months? No  LIVING ENVIRONMENT: Lives with: lives with their spouse Lives in: House/apartment  OCCUPATION: nurse, in admin  PLOF: Independent  PATIENT GOALS: decrease pain and have more normal bowel movements  PERTINENT HISTORY:  Vaginal hysterectomy, DVT LE, endometriosis, interstitial cystitis, exploratory laps for endometriosis, c-section, cholecystectomy Sexual abuse: No  BOWEL MOVEMENT: Pain with bowel movement: Yes Type of bowel movement:Type (Bristol Stool Scale) 1-7 (sometimes full range in the same bowel movement), Frequency sometimes many times a day, sometimes up to 4 days in between, and Strain Yes Fully empty rectum: No Leakage: No Pads: No Fiber supplement: No - has attempted metamucil and it made constipation worse  URINATION: Pain with urination: Yes Fully empty bladder: No Stream:  varies  Urgency: Yes: all the time Frequency: multiple times an hours  Leakage: Urge to void, Walking to the bathroom, Coughing, Sneezing, Laughing, and Exercise Pads: Yes:  daily  INTERCOURSE: Pain with intercourse: Initial Penetration and During Penetration Ability to have vaginal penetration:  Yes: with pain Climax: non painful WNL   PREGNANCY: Vaginal deliveries 1 Tearing Yes: 3rd degree tear C-section deliveries 1 Currently pregnant No  PROLAPSE: Periodically will feel vaginal bulging, heaviness in lower abdomen   OBJECTIVE:  11/09/22: No internal rectal or vaginal pelvic floor exam performed due to high level of  skin irritation   Weak transversus abdominus contraction  Abdominal restriction and tenderness in bil lower quadrant  Unable to perform pelvic tilt with appropriate coordination  LUMBARAROM/PROM:  A/PROM A/PROM  Eval (% available) 11/09/22 (% available)  Flexion 50 90  Extension 25, pain anteriorly  25, pain in low back  Right lateral flexion 50, pain anteriorly  75, pain on right  Left lateral flexion 50, pain anteriorly  75, pain on right   (Blank rows = not tested) 07/21/22: standing prolapse assessment demonstrated grade 2 anterior vaginal wall laxity   06/08/22:               External Perineal Exam: WNL                              Internal Pelvic Floor: burning reported with palpation of superficial muscles; deep aching/pressure with palpation of Lt levator ani   Patient confirms identification and approves PT to assess internal pelvic floor and treatment Yes  PELVIC MMT:   MMT eval  Vaginal 3/5  Internal Anal Sphincter 1/5  External Anal Sphincter 2/5  Puborectalis 1/5  Diastasis Recti   (Blank rows = not tested)        TONE: High in Lt levator ani   PROLAPSE: Not able to tell this session due to dyssynergic pelvic floor contraction     06/01/22: COGNITION: Overall cognitive status: Within functional limits for tasks assessed     SENSATION: Light touch: Appears intact Proprioception: Appears intact  GAIT: Comments: decreased hip extension, forward flexed trunk  POSTURE: rounded shoulders, forward  head, decreased lumbar lordosis, increased thoracic kyphosis, posterior pelvic tilt, and flexed trunk    LUMBARAROM/PROM:  A/PROM A/PROM  Eval (% available)  Flexion 50  Extension 25, pain anteriorly   Right lateral flexion 50, pain anteriorly   Left lateral flexion 50, pain anteriorly    (Blank rows = not tested)   PALPATION:   General  Significant abdominal restriction and tenderness; decreased rib mobility with mobilization and breathing    TODAY'S TREATMENT:                                                                                                                              DATE:  02/14/23 Manual: Abdominal soft tissue mobilization  Bowel mobilization Ileocecal valve myofascial release Neuromuscular re-education: Bil supine UE ball press with transversus abdominus and pelvic floor muscle contractions and breath coordination 10x Supine hip adduction ball press with transversus abdominus and pelvic floor muscle contractions and breath coordination 10x Bridge with hip adduction, transversus abdominus, and pelvic floor muscle 2 x 10 Supine leg extensions with transversus abdominus activation 10x bil   02/07/23 Manual: Abdominal soft tissue mobilization  Bowel mobilization Ileocecal valve myofascial release Neuromuscular re-education: Transversus abdominus training with multimodal cues for improved motor control and breath coordination  Transversus abdominus isometrics 10x Bil supine UE ball press  with transversus abdominus and pelvic floor muscle contractions and breath coordination 10x Supine hip adduction ball press with transversus abdominus and pelvic floor muscle contractions and breath coordination 10x Bridge with hip adduction, transversus abdominus, and pelvic floor muscle 2 x 10 Seated hip abduction red band with transversus abdominus and pelvic floor muscle 2 x 10 Seated resisted march red band with transversus abdominus and pelvic floor muscle 2 x  10  01/30/23 Manual: Abdominal soft tissue mobilization  Bowel mobilization Ileocecal valve myofascial release   PATIENT EDUCATION:  Education details: See above Person educated: Patient Education method: Explanation, Demonstration, Tactile cues, Verbal cues, and Handouts Education comprehension: verbalized understanding  HOME EXERCISE PROGRAM: [id number]  ASSESSMENT:  CLINICAL IMPRESSION: Pt continuing to see progress with vaginal irritation, productivity with bowel movements, and improved bladder control. We continued manual techniques to abdomen with good tolerance. She is doing well with core and pelvic floor muscle contraction with other exercises, reviewing exercises from previous visit. She states that these exercises are going well at home. She will continue to benefit from skilled PT intervention in order to decrease pain, improve bowel movements, and decrease urinary urgency/incontinence.     OBJECTIVE IMPAIRMENTS: decreased activity tolerance, decreased coordination, decreased endurance, decreased mobility, decreased strength, increased fascial restrictions, increased muscle spasms, impaired tone, postural dysfunction, and pain.   ACTIVITY LIMITATIONS: lifting, bending, continence, and locomotion level  PARTICIPATION LIMITATIONS: interpersonal relationship, community activity, and occupation  PERSONAL FACTORS: 1 comorbidity: medical history  are also affecting patient's functional outcome.   REHAB POTENTIAL: Good  CLINICAL DECISION MAKING: Stable/uncomplicated  EVALUATION COMPLEXITY: Low   GOALS: Goals reviewed with patient? Yes  SHORT TERM GOALS: Target date: 07/06/22 - updated 07/12/22 - updated 08/18/22 - updated 09/22/22 - updated 10/26/22 - updated 11/09/22 - updated 12/15/22 updated 01/18/23  Pt will be independent with HEP.   Baseline: Goal status: MET 07/12/22  2.  Pt will be independent with diaphragmatic breathing and down training activities in order to  improve pelvic floor relaxation.  Baseline:  Goal status: MET 07/12/22  3.  Pt will be independent with the knack, urge suppression technique, and double voiding in order to improve bladder habits and decrease urinary incontinence.   Baseline:  Goal status: MET 09/22/22  4.  Pt will be independent with use of squatty potty, relaxed toileting mechanics, and improved bowel movement techniques in order to increase ease of bowel movements and complete evacuation.   Baseline:  Goal status: MET 11/09/22  5.  Pt will be able to correctly perform diaphragmatic breathing and appropriate pressure management in order to prevent worsening vaginal wall laxity and improve pelvic floor A/ROM.   Baseline:  Goal status: MET 01/18/23  LONG TERM GOALS: Target date: 11/16/2022 - updated 07/12/22 - updated 08/18/22 - updated 09/22/22 - updated 10/26/22 - updated 11/09/22 - updated 12/15/22 - updated 01/18/23  Pt will be independent with advanced HEP.   Baseline:  Goal status: IN PROGRESS 01/18/23  2.  Pt will demonstrate normal pelvic floor muscle tone and A/ROM, able to achieve 4/5 strength with contractions and 10 sec endurance, in order to provide appropriate lumbopelvic support in functional activities.   Baseline:  Goal status: IN PROGRESS 12/15/22  3.  Pt will increase all impaired lumbar A/ROM by 25% without pain.  Baseline:  Goal status:  IN PROGRESS 12/15/22  4.  Pt will report pain no higher than 3/10 with any activity. Baseline:  Goal status:  IN PROGRESS 12/15/22  5.  Pt will  report 0/10 pain with vaginal penetration in order to improve intimate relationship with partner.    Baseline:  Goal status:  IN PROGRESS 12/15/22  6.  Pt will be able to go 2-3 hours in between voids without urgency or incontinence in order to improve QOL and perform all functional activities with less difficulty.   Baseline:  Goal status: MET 01/18/23  7.  Pt will report no leaks with laughing, coughing, sneezing  in order to improve comfort with interpersonal relationships and community activities.   Baseline:  Goal status:  IN PROGRESS 01/18/23  8.  Pt will have consistent bowel movements 4-5x/week without straining or pain.  Baseline:  Goal status:  MET 01/18/23  PLAN:  PT FREQUENCY: 1-2x/week  PT DURATION: 6 months  PLANNED INTERVENTIONS: Therapeutic exercises, Therapeutic activity, Neuromuscular re-education, Balance training, Gait training, Patient/Family education, Self Care, Joint mobilization, Dry Needling, Biofeedback, and Manual therapy  PLAN FOR NEXT SESSION: Continue to work on motor control activities surrounding pelvis; progress mobility; manual techniques as needed for comfort and to increase circulation.   Julio Alm, PT, DPT02/13/2511:16 AM

## 2023-02-17 ENCOUNTER — Other Ambulatory Visit: Payer: Self-pay

## 2023-02-17 ENCOUNTER — Ambulatory Visit: Payer: Commercial Managed Care - PPO | Admitting: Occupational Therapy

## 2023-02-17 DIAGNOSIS — I890 Lymphedema, not elsewhere classified: Secondary | ICD-10-CM | POA: Diagnosis not present

## 2023-02-17 NOTE — Therapy (Signed)
OUTPATIENT OCCUPATIONAL THERAPY TREATMENT NOTE  L LOWER EXTREMITY/ B LOWER QUADRANT LYMPHEDEMA  Patient Name: Karen [name] MRN: [id number] DOB:01-14-1970, 53 y.o., female Today's Date: 02/17/2023  END OF SESSION:   OT End of Session - 02/17/23 0801     Visit Number 23    Number of Visits 36    Date for OT Re-Evaluation 05/17/23    OT Start Time 0800    OT Stop Time 0900    OT Time Calculation (min) 60 min    Activity Tolerance Patient tolerated treatment well;No increased pain    Behavior During Therapy [facility] for tasks assessed/performed               Past Medical History:  Diagnosis Date   Adenomyosis    Anemia    Arthritis 2013   L knee gets steroid injections    Asthmatic bronchitis 01/31/2017   Back pain    Chronic constipation    Diarrhea    DVT of lower extremity (deep venous thrombosis) (HCC)    Dysmenorrhea    Endometriosis    Esophageal stricture    GERD (gastroesophageal reflux disease)    History of kidney stones    History of uterine fibroid    IBS (irritable bowel syndrome)    Interstitial cystitis    Lactose intolerance    Migraine    with aura   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   PONV (postoperative nausea and vomiting)    Recurrent upper respiratory infection (URI)    Sebaceous cyst of breast, right lower inner quadrant 10/25/2012   Excised 11/21/12    Sleep apnea    Syncope, non cardiac    Past Surgical History:  Procedure Laterality Date   ARTHROSCOPIC HAGLUNDS REPAIR     BREAST CYST EXCISION Right 11/21/2012   Procedure: CYST EXCISION BREAST;  Surgeon: Currie Paris, MD;  Location: [facility];  Service: General;  Laterality: Right;   BREAST CYST EXCISION Bilateral 01/18/2022   Procedure: EXCISION OF SEBACEOUS CYST BILATERAL BREAST;  Surgeon: Harriette Bouillon, MD;  Location: [facility];  Service: General;  Laterality: Bilateral;   BREAST EXCISIONAL BIOPSY Right 11/2012   CESAREAN SECTION  04/19/1993    CHOLECYSTECTOMY     COLONOSCOPY  05/02/2011   Procedure: COLONOSCOPY;  Surgeon: Vertell Novak., MD;  Location: [facility];  Service: Endoscopy;  Laterality: N/A;   colonoscopy  11/04/2014   DENTAL SURGERY  03/06/2018   2 surgeries ( 03/06/2018 and 04/06/2018)   to remove two separate benign tumors   ESOPHAGEAL MANOMETRY N/A 11/04/2015   Procedure: ESOPHAGEAL MANOMETRY (EM);  Surgeon: Carman Ching, MD;  Location: [facility];  Service: Endoscopy;  Laterality: N/A;   ESOPHAGOGASTRODUODENOSCOPY  05/02/2011   Procedure: ESOPHAGOGASTRODUODENOSCOPY (EGD);  Surgeon: Vertell Novak., MD;  Location: [facility];  Service: Endoscopy;  Laterality: N/A;   ESOPHAGOGASTRODUODENOSCOPY N/A 09/01/2014   Procedure: ESOPHAGOGASTRODUODENOSCOPY (EGD);  Surgeon: Carman Ching, MD;  Location: [facility];  Service: Endoscopy;  Laterality: N/A;   KNEE SURGERY     LAPAROSCOPY     x 4   LESION REMOVAL N/A 01/18/2022   Procedure: EXCISION OF SEBACEOUS CYSTS CHEST AND NECK;  Surgeon: Harriette Bouillon, MD;  Location: [facility];  Service: General;  Laterality: N/A;   PH IMPEDANCE STUDY N/A 11/04/2015   Procedure: PH IMPEDANCE STUDY;  Surgeon: Carman Ching, MD;  Location: [facility];  Service: Endoscopy;  Laterality: N/A;   VAGINAL HYSTERECTOMY  2010   TVH--ovaries  remain   Patient Active Problem List   Diagnosis Date Noted   Sjogren syndrome with keratoconjunctivitis (HCC) 12/09/2022   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   Elevated LFTs 04/04/2021   Colitis 04/04/2021   Bacterial overgrowth syndrome 05/21/2020   Obesity 05/21/2020   History of endometriosis 05/21/2020   Constipation due to outlet dysfunction 02/05/2020   Gastroesophageal reflux disease 02/05/2020   Obstructive sleep apnea treated with continuous positive airway pressure (CPAP) 06/16/2017   Perennial allergic rhinitis with a nonallergic component 01/31/2017   Asthmatic bronchitis 01/31/2017   GI symptoms 01/31/2017    Family history of colon cancer 01/27/2015   Chest pain 12/13/2012   Dyspnea 12/13/2012   DVT of lower extremity (deep venous thrombosis) (HCC) 01/03/1990    PCP: Coral Else, MD  REFERRING PROVIDER: Lorenda Ishihara, MD  REFERRING DIAG: I89.0  THERAPY DIAG:  Lymphedema, not elsewhere classified  Rationale for Evaluation and Treatment: Rehabilitation  ONSET DATE: ~2014; exacerbation in 2023  SUBJECTIVE:                                                                                                                                                                                           SUBJECTIVE STATEMENT: Pt presents for OT to address lower quadrant and LLE lymphedema. Pt rates LE-related pain in her abdomen as 2/10 this morning. Pt reports leg pain 4/10. Pt states she continues walking for exercise on a regular basis. She states she plans to do a cleans over the weekend  in an effort to keep her bowels moving regularly and to reduce constipation discomfort.   PERTINENT HISTORY: CVI, OSA (no CPAP), GERD, Esophageal stricture, IBS, Lactose intolerant, Diarrhea, Hx LE DVT  PAIN:  Are you having pain? Yes: NPRS scale: see SUBJECTIVE/10 Pain location: BLE Pain description: myalgia, tight, heavy, tingling, numbness Aggravating factors: standing, walking, dependent sitting > 30 minutes Relieving factors: elevation  PRECAUTIONS: Other: LYMPHEDEMA; Hx LLE DVT  RED FLAGS: Hx LLE DVT  WEIGHT BEARING RESTRICTIONS: No  FALLS:  Has patient fallen in last 6 months? No  LIVING ENVIRONMENT: Lives with: lives with spouse and daughter Lives in: House/apartment Stairs: Yes; Internal: 14 steps; yes and External: 4 steps; yes Has following equipment at home: None  OCCUPATION: [employer] operational officer, walking, desk work  LEISURE: family time  HAND DOMINANCE: right   PRIOR LEVEL OF FUNCTION: Independent  PATIENT GOALS: Feel better, be able to be more active without pain,  wear preferred street shoes  OBJECTIVE: Note: Objective measures were completed at Evaluation unless otherwise noted.  COGNITION:  Overall cognitive status: Within functional limits for tasks assessed   OBSERVATIONS / OTHER  ASSESSMENTS:   POSTURE: WFL  LE ROM: WFL  LE MMT: WFL  LYMPHEDEMA ASSESSMENTS: non-Ca related  INFECTIONS: denies cellulitis and wound hx  BLE COMPARATIVE LIMB VOLUMETRICS Initial 11/15/22  LANDMARK RIGHT (dominant)  R LEG (A-D) 3028.9 ml  R THIGH (E-G) 4809.60 ml  R FULL LIMB (A-G) 7838.5 ml  Limb Volume differential (LVD)  %  Volume change since initial %  Volume change overall V  (Blank rows = not tested)  LANDMARK LEFT  (Rx)  L LEG (A-D) 3189.9 ml  L THIGH (E-G) 4959.8 ml  L FULL LIMB (A-G) 8149.7 ml  Limb Volume differential (LVD)  LEG LVD = 5.32%, L>R; THIGH LVD = 3.12%, L>R; And full LLE LVD = 3.97%, L>R. 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 Obesity  Skin  Description Hyper-Keratosis Peau d' Orange Shiny Tight Fibrotic/ Indurated Fatty Doughy Spongy/ boggy       R>L x  x   Skin dry Flaky WNL Macerated   mildly      Color Redness Varicosities Blanching Hemosiderin Stain Mottled        x   Odor Malodorous Yeast Fungal infection  WNL      x   Temperature Warm Cool wnl     x    Pitting Edema   1+ 2+ 3+ 4+ Non-pitting         x   Girth Symmetrical Asymmetrical                   Distribution    L>R toes to groin    Stemmer Sign Positive Negative   +L -R   Lymphorrhea History Of:  Present Absent     x    Wounds History Of Present Absent Venous Arterial Pressure Sheer     x        Signs of Infection Redness Warmth Erythema Acute Swelling Drainage Borders                    Sensation Light Touch Deep pressure Hypersensitivity   In tact Impaired In tact Impaired Absent Impaired   x  x  x      Nails WNL   Fungus nail dystrophy   x     Hair Growth Symmetrical Asymmetrical   x    Skin Creases Base of toes  Ankles   Base of Fingers knees       Abdominal pannus Thigh Lobules  Face/neck   x           GAIT: Distance walked: >500' Assistive device utilized: None Level of assistance: Complete Independence Comments: Functional ambulation and transfers [facility]  LYMPHEDEMA LIFE IMPACT SCALE (LLIS): Initial 11/15/22  50%  FOTO functional outcome measure: Deferred. FOTO discontinued.  TODAY'S TREATMENT:  Pt edu re lymphedema self management- simple self MLD MLD to BLQ emphasis on colon and LLE  PATIENT EDUCATION:  Continued Pt/ CG edu for how function of cisterna chyli, part of the thoracic duct of deep abdominal lymphatics, assists with digestion by filtering many of the fats and proteins from the digestive system. Sub optimal lymphatic flow in this region may result in constipation, bloating, swelling, etc. MLD helps mobilize these protein and fats , and the rhythmic movement of manual lymphatic drainage stimulates digestive muscles and increase peristalsis. Provided printed resource for reference.  Topics include outcome of comparative limb volumetrics- starting limb volume differentials (LVDs), technology and gradient techniques used for short stretch, multilayer compression wrapping, simple self-MLD, therapeutic lymphatic pumping exercises, skin/nail care, LE precautions,. compression garment recommendations and specifications, wear and care schedule and compression garment donning / doffing w assistive devices. Discussed progress towards all OT goals since commencing CDT. All questions answered to the Pt's satisfaction. Good return. Person educated: Patient  Education method: Explanation, Demonstration, and Handouts Education comprehension:  verbalized understanding, returned demonstration, verbal cues required, and needs further education  HOME EXERCISE PROGRAM: BLE lymphatic pumping there ex using- 1 sets of 10 reps, each exercise in order-  1-2 x daily, bilaterally Simple self MLD 1 x daily Daily skin care to increase hydration, skin mobility and decrease infection risk- can be done during MLD Compression wraps 23/7 during intensive phase of CDT Compression garments/ devices during self-management phase of CDT  ASSESSMENT:  CLINICAL IMPRESSION: Continued lymphedema self care home program training w emphasis on simple self-MLD. Pt mastered J stroke and short neck sequence by end of session. Pt tolerated abdominal MLD and MLD to LLE/LLQ today without increased pain. Pt continues to make steady progress towards all goals. Cont as per POC.  11/15/22 Initial Evaluation: Asante Ritacco is a 53 y.o. female presenting with very mild, stage II, LLE lymphedema 2/2 venous insufficiency and obesity. L Leg swelling and associated pain swelling fluctuates. It has progressed over time, and now no longer resolves with elevation over night. RLE swelling limits balance during functional ambulation. It exacerbates infection and falls risk. LLE/RLQ lymphedema interferes with functional performance in all occupational domains, including basic and instrumental ADLs, productive activities, leisure pursuits and social participation. Pt will benefit from Occupational Therapy for Complete Decongestive Therapy (CDT) to restore function, to reduce physical and psychologic suffering associated with chronic, progressive lymphedema and associated pain, and to limit infection. CDT will include manual lymphatic drainage (MLD), skin care, therapeutic exercise and compression wraps and garment's devices. Without skilled OT for lymphedema care the condition will worsen and further functional decline is expected  Custom-made gradient compression garments and HOS devices  are medically necessary because they are uniquely sized and shaped to fit the exact dimensions of the affected extremities, and to provide appropriate medical grade, graduated compression essential for optimally managing chronic, progressive lymphedema. Multiple custom compression garments are needed to ensure proper hygiene to limit infection risk. Custom compression garments should be replaced q 3-6 months When worn consistently for optimal lipo-lymphedema self-management over time. HOS devices, medically necessary to limit fibrosis buildup in tissue, should be replaced q 2 years and PRN when worn out.      OBJECTIVE IMPAIRMENTS: decreased activity tolerance, decreased knowledge of condition, decreased knowledge of use of DME, increased edema, impaired sensation, pain, and chronic, progressive leg swelling .   ACTIVITY LIMITATIONS: dependent sitting, extended standing and/ or walking, squatting, and lower body dressing, fitting preferred street shoes  PARTICIPATION LIMITATIONS: shopping, community activity, occupation, and yard work  PERSONAL FACTORS: 3+ comorbidities: Hx DVT,  OSA, CVI. Varicose veins  are also affecting patient's functional outcome.   REHAB POTENTIAL: Good  EVALUATION COMPLEXITY: Moderate   GOALS: Goals reviewed with patient? Yes  SHORT TERM GOALS: Target date: 4th OT Rx visit  SHORT TERM GOALS: Target date: 4th OT Rx visit   Pt will demonstrate understanding of lymphedema precautions and prevention strategies with modified independence using a printed reference to identify at least 5 precautions and discussing how s/he may implement them into daily life to reduce risk of progression with extra time. Baseline:Max A Goal status: GOAL MET   Pt will be able to apply multilayer, knee length, gradient, compression wraps to one leg at a time with modified assistance (extra time and assistive device/s) to decrease limb volume, to limit infection risk, and to limit lymphedema  progression.  Baseline: Dependent Goal status: GOAL DISCHARGED. Pt Going to compression garments instead  LONG TERM GOALS: Target date: 02/08/23  Given this patient's Intake score of 67%/100% on the functional outcomes FOTO tool, patient will experience an increase in function of 5 points to improve basic and instrumental ADLs performance, including lymphedema self-care.  Baseline: Max A Goal status:GOAL DISCHARGED.  FOTO TOOL DISCONTINUED AT CLINIC   Given this patient's Intake score of 50% on the Lymphedema Life Impact Scale (LLIS), patient will experience a reduction of at least 5 points in her perceived level of functional impairment resulting from lymphedema to improve functional performance and quality of life (QOL). Baseline: % Goal status: PROGRESSING  Pt will achieve at least a 10% volume reduction in B legs to return limb to typical size and shape, to limit infection risk and LE progression, to decrease pain, to improve function. Baseline: Dependent Goal status: PROGRESSING  4.  Pt will obtain proper compression garments/devices and achieve modified independence (extra time + assistive devices) with donning/doffing to optimize limb volume reductions and limit LE  progression over time. Baseline:  Goal status: ONGOING. Garments  5.  During Intensive phase CDT , with modified independence, Pt will achieve at least 85% compliance with all lymphedema self-care home program components, including daily skin care, compression wraps and /or garments, simple self MLD and lymphatic pumping therex to habituate LE self care protocol  into ADLs for optimal LE self-management over time. Baseline: Dependent Goal status: PROGRESSING   PLAN:  OT FREQUENCY: 2x/week  OT DURATION: 12 weeks  PLANNED INTERVENTIONS: 97110-Therapeutic exercises, 97530- Therapeutic activity, 97535- Self Care, 13086- Manual therapy, Patient/Family education, Manual lymph drainage, Compression bandaging, and fit with  appropriate compression garments  PLAN FOR NEXT SESSION:  Cont MLD Cont Pt edu for LE self-care  Loel Dubonnet, MS, OTR/L, CLT-LANA 02/17/23 12:08 PM '

## 2023-02-20 ENCOUNTER — Encounter: Payer: Self-pay | Admitting: Occupational Therapy

## 2023-02-20 ENCOUNTER — Ambulatory Visit: Payer: Commercial Managed Care - PPO | Admitting: Occupational Therapy

## 2023-02-20 DIAGNOSIS — I890 Lymphedema, not elsewhere classified: Secondary | ICD-10-CM | POA: Diagnosis not present

## 2023-02-20 NOTE — Therapy (Signed)
OUTPATIENT OCCUPATIONAL THERAPY TREATMENT NOTE  L LOWER EXTREMITY/ B LOWER QUADRANT LYMPHEDEMA  Patient Name: Karen [name] MRN: [id number] DOB:01-10-1970, 53 y.o., female Today's Date: 02/20/2023  END OF SESSION:   OT End of Session - 02/20/23 0804     Visit Number 24    Number of Visits 36    Date for OT Re-Evaluation 05/17/23    OT Start Time 0800    OT Stop Time 0905    OT Time Calculation (min) 65 min    Activity Tolerance Patient tolerated treatment well;No increased pain    Behavior During Therapy [facility] for tasks assessed/performed               Past Medical History:  Diagnosis Date   Adenomyosis    Anemia    Arthritis 2013   L knee gets steroid injections    Asthmatic bronchitis 01/31/2017   Back pain    Chronic constipation    Diarrhea    DVT of lower extremity (deep venous thrombosis) (HCC)    Dysmenorrhea    Endometriosis    Esophageal stricture    GERD (gastroesophageal reflux disease)    History of kidney stones    History of uterine fibroid    IBS (irritable bowel syndrome)    Interstitial cystitis    Lactose intolerance    Migraine    with aura   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   PONV (postoperative nausea and vomiting)    Recurrent upper respiratory infection (URI)    Sebaceous cyst of breast, right lower inner quadrant 10/25/2012   Excised 11/21/12    Sleep apnea    Syncope, non cardiac    Past Surgical History:  Procedure Laterality Date   ARTHROSCOPIC HAGLUNDS REPAIR     BREAST CYST EXCISION Right 11/21/2012   Procedure: CYST EXCISION BREAST;  Surgeon: Currie Paris, MD;  Location: [facility];  Service: General;  Laterality: Right;   BREAST CYST EXCISION Bilateral 01/18/2022   Procedure: EXCISION OF SEBACEOUS CYST BILATERAL BREAST;  Surgeon: Harriette Bouillon, MD;  Location: [facility];  Service: General;  Laterality: Bilateral;   BREAST EXCISIONAL BIOPSY Right 11/2012   CESAREAN SECTION  04/19/1993    CHOLECYSTECTOMY     COLONOSCOPY  05/02/2011   Procedure: COLONOSCOPY;  Surgeon: Vertell Novak., MD;  Location: [facility];  Service: Endoscopy;  Laterality: N/A;   colonoscopy  11/04/2014   DENTAL SURGERY  03/06/2018   2 surgeries ( 03/06/2018 and 04/06/2018)   to remove two separate benign tumors   ESOPHAGEAL MANOMETRY N/A 11/04/2015   Procedure: ESOPHAGEAL MANOMETRY (EM);  Surgeon: Carman Ching, MD;  Location: [facility];  Service: Endoscopy;  Laterality: N/A;   ESOPHAGOGASTRODUODENOSCOPY  05/02/2011   Procedure: ESOPHAGOGASTRODUODENOSCOPY (EGD);  Surgeon: Vertell Novak., MD;  Location: [facility];  Service: Endoscopy;  Laterality: N/A;   ESOPHAGOGASTRODUODENOSCOPY N/A 09/01/2014   Procedure: ESOPHAGOGASTRODUODENOSCOPY (EGD);  Surgeon: Carman Ching, MD;  Location: [facility];  Service: Endoscopy;  Laterality: N/A;   KNEE SURGERY     LAPAROSCOPY     x 4   LESION REMOVAL N/A 01/18/2022   Procedure: EXCISION OF SEBACEOUS CYSTS CHEST AND NECK;  Surgeon: Harriette Bouillon, MD;  Location: [facility];  Service: General;  Laterality: N/A;   PH IMPEDANCE STUDY N/A 11/04/2015   Procedure: PH IMPEDANCE STUDY;  Surgeon: Carman Ching, MD;  Location: [facility];  Service: Endoscopy;  Laterality: N/A;   VAGINAL HYSTERECTOMY  2010   TVH--ovaries  remain   Patient Active Problem List   Diagnosis Date Noted   Sjogren syndrome with keratoconjunctivitis (HCC) 12/09/2022   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   Elevated LFTs 04/04/2021   Colitis 04/04/2021   Bacterial overgrowth syndrome 05/21/2020   Obesity 05/21/2020   History of endometriosis 05/21/2020   Constipation due to outlet dysfunction 02/05/2020   Gastroesophageal reflux disease 02/05/2020   Obstructive sleep apnea treated with continuous positive airway pressure (CPAP) 06/16/2017   Perennial allergic rhinitis with a nonallergic component 01/31/2017   Asthmatic bronchitis 01/31/2017   GI symptoms 01/31/2017    Family history of colon cancer 01/27/2015   Chest pain 12/13/2012   Dyspnea 12/13/2012   DVT of lower extremity (deep venous thrombosis) (HCC) 01/03/1990    PCP: Coral Else, MD  REFERRING PROVIDER: Lorenda Ishihara, MD  REFERRING DIAG: I89.0  THERAPY DIAG:  Lymphedema, not elsewhere classified  Rationale for Evaluation and Treatment: Rehabilitation  ONSET DATE: ~2014; exacerbation in 2023  SUBJECTIVE:                                                                                                                                                                                           SUBJECTIVE STATEMENT: Pt presents for OT to address lower quadrant and LLE lymphedema. Pt rates LE-related pain in her abdomen as 3/10 this morning. Pt reports leg pain 4/10. Pt states she continues walking for exercise on a regular basis. She states she  did not perform cleanse this weekend s as she was so busy. She has no new complaints.   PERTINENT HISTORY: CVI, OSA (no CPAP), GERD, Esophageal stricture, IBS, Lactose intolerant, Diarrhea, Hx LE DVT  PAIN:  Are you having pain? Yes: NPRS scale: see SUBJECTIVE/10 Pain location: BLE Pain description: myalgia, tight, heavy, tingling, numbness Aggravating factors: standing, walking, dependent sitting > 30 minutes Relieving factors: elevation  PRECAUTIONS: Other: LYMPHEDEMA; Hx LLE DVT  RED FLAGS: Hx LLE DVT  WEIGHT BEARING RESTRICTIONS: No  FALLS:  Has patient fallen in last 6 months? No  LIVING ENVIRONMENT: Lives with: lives with spouse and daughter Lives in: House/apartment Stairs: Yes; Internal: 14 steps; yes and External: 4 steps; yes Has following equipment at home: None  OCCUPATION: [employer] operational officer, walking, desk work  LEISURE: family time  HAND DOMINANCE: right   PRIOR LEVEL OF FUNCTION: Independent  PATIENT GOALS: Feel better, be able to be more active without pain, wear preferred street  shoes  OBJECTIVE: Note: Objective measures were completed at Evaluation unless otherwise noted.  COGNITION:  Overall cognitive status: Within functional limits for tasks assessed   OBSERVATIONS / OTHER ASSESSMENTS:   POSTURE: [facility]  LE ROM: WFL  LE MMT: WFL  LYMPHEDEMA ASSESSMENTS: non-Ca related  INFECTIONS: denies cellulitis and wound hx  BLE COMPARATIVE LIMB VOLUMETRICS Initial 11/15/22  LANDMARK RIGHT (dominant)  R LEG (A-D) 3028.9 ml  R THIGH (E-G) 4809.60 ml  R FULL LIMB (A-G) 7838.5 ml  Limb Volume differential (LVD)  %  Volume change since initial %  Volume change overall V  (Blank rows = not tested)  LANDMARK LEFT  (Rx)  L LEG (A-D) 3189.9 ml  L THIGH (E-G) 4959.8 ml  L FULL LIMB (A-G) 8149.7 ml  Limb Volume differential (LVD)  LEG LVD = 5.32%, L>R; THIGH LVD = 3.12%, L>R; And full LLE LVD = 3.97%, L>R. 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 Obesity  Skin  Description Hyper-Keratosis Peau d' Orange Shiny Tight Fibrotic/ Indurated Fatty Doughy Spongy/ boggy       R>L x  x   Skin dry Flaky WNL Macerated   mildly      Color Redness Varicosities Blanching Hemosiderin Stain Mottled        x   Odor Malodorous Yeast Fungal infection  WNL      x   Temperature Warm Cool wnl     x    Pitting Edema   1+ 2+ 3+ 4+ Non-pitting         x   Girth Symmetrical Asymmetrical                   Distribution    L>R toes to groin    Stemmer Sign Positive Negative   +L -R   Lymphorrhea History Of:  Present Absent     x    Wounds History Of Present Absent Venous Arterial Pressure Sheer     x        Signs of Infection Redness Warmth Erythema Acute Swelling Drainage Borders                    Sensation Light Touch Deep pressure Hypersensitivity   In tact Impaired In tact Impaired Absent Impaired   x  x  x     Nails WNL   Fungus nail  dystrophy   x     Hair Growth Symmetrical Asymmetrical   x    Skin Creases Base of toes  Ankles   Base of Fingers knees       Abdominal pannus Thigh Lobules  Face/neck   x           GAIT: Distance walked: >500' Assistive device utilized: None Level of assistance: Complete Independence Comments: Functional ambulation and transfers [facility]  LYMPHEDEMA LIFE IMPACT SCALE (LLIS): Initial 11/15/22  50%  FOTO functional outcome measure: Deferred. FOTO discontinued.  TODAY'S TREATMENT:  Pt edu re lymphedema self management- simple self MLD MLD to BLQ emphasis on colon and LLE  PATIENT EDUCATION:  Continued Pt/ CG edu for how function of cisterna chyli, part of the thoracic duct of deep abdominal lymphatics, assists with digestion by filtering many of the fats and proteins from the digestive system. Sub optimal lymphatic flow in this region may result in constipation, bloating, swelling, etc. MLD helps mobilize these protein and fats , and the rhythmic movement of manual lymphatic drainage stimulates digestive muscles and increase peristalsis. Provided printed resource for reference.  Topics include outcome of comparative limb volumetrics- starting limb volume differentials (LVDs), technology and gradient techniques used for short stretch, multilayer compression wrapping, simple self-MLD, therapeutic lymphatic pumping exercises, skin/nail care, LE precautions,. compression garment recommendations and specifications, wear and care schedule and compression garment donning / doffing w assistive devices. Discussed progress towards all OT goals since commencing CDT. All questions answered to the Pt's satisfaction. Good return. Person educated: Patient  Education method: Explanation, Demonstration, and Handouts Education comprehension: verbalized understanding, returned  demonstration, verbal cues required, and needs further education  HOME EXERCISE PROGRAM: BLE lymphatic pumping there ex using- 1 sets of 10 reps, each exercise in order-  1-2 x daily, bilaterally Simple self MLD 1 x daily Daily skin care to increase hydration, skin mobility and decrease infection risk- can be done during MLD Compression wraps 23/7 during intensive phase of CDT Compression garments/ devices during self-management phase of CDT  ASSESSMENT:  CLINICAL IMPRESSION: Continued lymphedema self care home program training w emphasis on simple self-MLD.  Pt tolerated abdominal MLD and MLD to LLE/LLQ without increased pain.  Pt  states continues to experience digestive discomfort but MLD helps to reduce pain and related symptoms. Cont as per POC.  11/15/22 Initial Evaluation: Karen [name] is a 54 y.o. female presenting with very mild, stage II, LLE lymphedema 2/2 venous insufficiency and obesity. L Leg swelling and associated pain swelling fluctuates. It has progressed over time, and now no longer resolves with elevation over night. RLE swelling limits balance during functional ambulation. It exacerbates infection and falls risk. LLE/RLQ lymphedema interferes with functional performance in all occupational domains, including basic and instrumental ADLs, productive activities, leisure pursuits and social participation. Pt will benefit from Occupational Therapy for Complete Decongestive Therapy (CDT) to restore function, to reduce physical and psychologic suffering associated with chronic, progressive lymphedema and associated pain, and to limit infection. CDT will include manual lymphatic drainage (MLD), skin care, therapeutic exercise and compression wraps and garment's devices. Without skilled OT for lymphedema care the condition will worsen and further functional decline is expected  Custom-made gradient compression garments and HOS devices are medically necessary because they are uniquely  sized and shaped to fit the exact dimensions of the affected extremities, and to provide appropriate medical grade, graduated compression essential for optimally managing chronic, progressive lymphedema. Multiple custom compression garments are needed to ensure proper hygiene to limit infection risk. Custom compression garments should be replaced q 3-6 months When worn consistently for optimal lipo-lymphedema self-management over time. HOS devices, medically necessary to limit fibrosis buildup in tissue, should be replaced q 2 years and PRN when worn out.      OBJECTIVE IMPAIRMENTS: decreased activity tolerance, decreased knowledge of condition, decreased knowledge of use of DME, increased edema, impaired sensation, pain, and chronic, progressive leg swelling .   ACTIVITY LIMITATIONS: dependent sitting, extended standing and/ or walking, squatting, and lower body dressing, fitting preferred street shoes  PARTICIPATION LIMITATIONS:  shopping, community activity, occupation, and yard work  PERSONAL FACTORS: 3+ comorbidities: Hx DVT,  OSA, CVI. Varicose veins  are also affecting patient's functional outcome.   REHAB POTENTIAL: Good  EVALUATION COMPLEXITY: Moderate   GOALS: Goals reviewed with patient? Yes  SHORT TERM GOALS: Target date: 4th OT Rx visit  SHORT TERM GOALS: Target date: 4th OT Rx visit   Pt will demonstrate understanding of lymphedema precautions and prevention strategies with modified independence using a printed reference to identify at least 5 precautions and discussing how s/he may implement them into daily life to reduce risk of progression with extra time. Baseline:Max A Goal status: GOAL MET   Pt will be able to apply multilayer, knee length, gradient, compression wraps to one leg at a time with modified assistance (extra time and assistive device/s) to decrease limb volume, to limit infection risk, and to limit lymphedema progression.  Baseline: Dependent Goal status:  GOAL DISCHARGED. Pt Going to compression garments instead  LONG TERM GOALS: Target date: 02/08/23  Given this patient's Intake score of 67%/100% on the functional outcomes FOTO tool, patient will experience an increase in function of 5 points to improve basic and instrumental ADLs performance, including lymphedema self-care.  Baseline: Max A Goal status:GOAL DISCHARGED.  FOTO TOOL DISCONTINUED AT CLINIC   Given this patient's Intake score of 50% on the Lymphedema Life Impact Scale (LLIS), patient will experience a reduction of at least 5 points in her perceived level of functional impairment resulting from lymphedema to improve functional performance and quality of life (QOL). Baseline: % Goal status: PROGRESSING  Pt will achieve at least a 10% volume reduction in B legs to return limb to typical size and shape, to limit infection risk and LE progression, to decrease pain, to improve function. Baseline: Dependent Goal status: PROGRESSING  4.  Pt will obtain proper compression garments/devices and achieve modified independence (extra time + assistive devices) with donning/doffing to optimize limb volume reductions and limit LE  progression over time. Baseline:  Goal status: ONGOING. Garments  5.  During Intensive phase CDT , with modified independence, Pt will achieve at least 85% compliance with all lymphedema self-care home program components, including daily skin care, compression wraps and /or garments, simple self MLD and lymphatic pumping therex to habituate LE self care protocol  into ADLs for optimal LE self-management over time. Baseline: Dependent Goal status: PROGRESSING   PLAN:  OT FREQUENCY: 2x/week  OT DURATION: 12 weeks  PLANNED INTERVENTIONS: 97110-Therapeutic exercises, 97530- Therapeutic activity, 97535- Self Care, [id number]- Manual therapy, Patient/Family education, Manual lymph drainage, Compression bandaging, and fit with appropriate compression garments  PLAN FOR NEXT  SESSION:  Cont MLD Cont Pt edu for LE self-care  Loel Dubonnet, MS, OTR/L, CLT-LANA 02/20/23 12:40 PM '

## 2023-02-21 ENCOUNTER — Encounter: Payer: Self-pay | Admitting: Occupational Therapy

## 2023-02-21 ENCOUNTER — Ambulatory Visit: Payer: Commercial Managed Care - PPO | Admitting: Occupational Therapy

## 2023-02-21 ENCOUNTER — Ambulatory Visit: Payer: Commercial Managed Care - PPO

## 2023-02-21 DIAGNOSIS — M62838 Other muscle spasm: Secondary | ICD-10-CM

## 2023-02-21 DIAGNOSIS — I890 Lymphedema, not elsewhere classified: Secondary | ICD-10-CM

## 2023-02-21 DIAGNOSIS — M5459 Other low back pain: Secondary | ICD-10-CM

## 2023-02-21 DIAGNOSIS — M6281 Muscle weakness (generalized): Secondary | ICD-10-CM | POA: Diagnosis not present

## 2023-02-21 DIAGNOSIS — R279 Unspecified lack of coordination: Secondary | ICD-10-CM

## 2023-02-21 DIAGNOSIS — R102 Pelvic and perineal pain: Secondary | ICD-10-CM | POA: Diagnosis not present

## 2023-02-21 NOTE — Therapy (Signed)
OUTPATIENT PHYSICAL THERAPY FEMALE PELVIC TREATMENT   Patient Name: Karen [name] MRN: [id number] DOB:1970-06-15, 53 y.o., female Today's Date: 02/21/2023  END OF SESSION:  PT End of Session - 02/21/23 1450     Visit Number 30    Date for PT Re-Evaluation 04/26/23    Authorization Type [employer]    PT Start Time 1445    PT Stop Time 1525    PT Time Calculation (min) 40 min    Activity Tolerance Patient tolerated treatment well    Behavior During Therapy [facility] for tasks assessed/performed                     Past Medical History:  Diagnosis Date   Adenomyosis    Anemia    Arthritis 2013   L knee gets steroid injections    Asthmatic bronchitis 01/31/2017   Back pain    Chronic constipation    Diarrhea    DVT of lower extremity (deep venous thrombosis) (HCC)    Dysmenorrhea    Endometriosis    Esophageal stricture    GERD (gastroesophageal reflux disease)    History of kidney stones    History of uterine fibroid    IBS (irritable bowel syndrome)    Interstitial cystitis    Lactose intolerance    Migraine    with aura   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   PONV (postoperative nausea and vomiting)    Recurrent upper respiratory infection (URI)    Sebaceous cyst of breast, right lower inner quadrant 10/25/2012   Excised 11/21/12    Sleep apnea    Syncope, non cardiac    Past Surgical History:  Procedure Laterality Date   ARTHROSCOPIC HAGLUNDS REPAIR     BREAST CYST EXCISION Right 11/21/2012   Procedure: CYST EXCISION BREAST;  Surgeon: Currie Paris, MD;  Location: [facility];  Service: General;  Laterality: Right;   BREAST CYST EXCISION Bilateral 01/18/2022   Procedure: EXCISION OF SEBACEOUS CYST BILATERAL BREAST;  Surgeon: Harriette Bouillon, MD;  Location: [facility];  Service: General;  Laterality: Bilateral;   BREAST EXCISIONAL BIOPSY Right 11/2012   CESAREAN SECTION  04/19/1993   CHOLECYSTECTOMY      COLONOSCOPY  05/02/2011   Procedure: COLONOSCOPY;  Surgeon: Vertell Novak., MD;  Location: [facility];  Service: Endoscopy;  Laterality: N/A;   colonoscopy  11/04/2014   DENTAL SURGERY  03/06/2018   2 surgeries ( 03/06/2018 and 04/06/2018)   to remove two separate benign tumors   ESOPHAGEAL MANOMETRY N/A 11/04/2015   Procedure: ESOPHAGEAL MANOMETRY (EM);  Surgeon: Carman Ching, MD;  Location: [facility];  Service: Endoscopy;  Laterality: N/A;   ESOPHAGOGASTRODUODENOSCOPY  05/02/2011   Procedure: ESOPHAGOGASTRODUODENOSCOPY (EGD);  Surgeon: Vertell Novak., MD;  Location: [facility];  Service: Endoscopy;  Laterality: N/A;   ESOPHAGOGASTRODUODENOSCOPY N/A 09/01/2014   Procedure: ESOPHAGOGASTRODUODENOSCOPY (EGD);  Surgeon: Carman Ching, MD;  Location: [facility];  Service: Endoscopy;  Laterality: N/A;   KNEE SURGERY     LAPAROSCOPY     x 4   LESION REMOVAL N/A 01/18/2022   Procedure: EXCISION OF SEBACEOUS CYSTS CHEST AND NECK;  Surgeon: Harriette Bouillon, MD;  Location: [facility];  Service: General;  Laterality: N/A;   PH IMPEDANCE STUDY N/A 11/04/2015   Procedure: PH IMPEDANCE STUDY;  Surgeon: Carman Ching, MD;  Location: [facility];  Service: Endoscopy;  Laterality: N/A;   VAGINAL HYSTERECTOMY  2010   TVH--ovaries remain  Patient Active Problem List   Diagnosis Date Noted   Sjogren syndrome with keratoconjunctivitis (HCC) 12/09/2022   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   Elevated LFTs 04/04/2021   Colitis 04/04/2021   Bacterial overgrowth syndrome 05/21/2020   Obesity 05/21/2020   History of endometriosis 05/21/2020   Constipation due to outlet dysfunction 02/05/2020   Gastroesophageal reflux disease 02/05/2020   Obstructive sleep apnea treated with continuous positive airway pressure (CPAP) 06/16/2017   Perennial allergic rhinitis with a nonallergic component 01/31/2017   Asthmatic bronchitis 01/31/2017   GI symptoms 01/31/2017   Family history of  colon cancer 01/27/2015   Chest pain 12/13/2012   Dyspnea 12/13/2012   DVT of lower extremity (deep venous thrombosis) (HCC) 01/03/1990    PCP: Lorenda Ishihara, MD  REFERRING PROVIDER: Jerene Bears, MD   REFERRING DIAG: M62.89 (ICD-10-CM) - Pelvic floor dysfunction  THERAPY DIAG:  Pelvic pain  Other low back pain  Muscle weakness (generalized)  Other muscle spasm  Unspecified lack of coordination  Rationale for Evaluation and Treatment: Rehabilitation  ONSET DATE: chronic  SUBJECTIVE:                                                                                                                                                                                           SUBJECTIVE STATEMENT:  Pt states that she is still doing better. She was starting to feel constipated over the weekend, but she reports walking helped get things moving.    PAIN: 02/21/23 Are you having pain? Yes NPRS scale: 2/10 Pain location:  Lower abdominal pain, Lt sided pain, low back pain  Pain type: turning, knotted, pressure Pain description: intermittent and constant   Aggravating factors: constipation or frequent bowel movements/constantly having bowel movements  Relieving factors: exercises (bowel massage, happy baby, spinal twists, walking)  PRECAUTIONS: None  WEIGHT BEARING RESTRICTIONS: No  FALLS:  Has patient fallen in last 6 months? No  LIVING ENVIRONMENT: Lives with: lives with their spouse Lives in: House/apartment  OCCUPATION: nurse, in admin  PLOF: Independent  PATIENT GOALS: decrease pain and have more normal bowel movements  PERTINENT HISTORY:  Vaginal hysterectomy, DVT LE, endometriosis, interstitial cystitis, exploratory laps for endometriosis, c-section, cholecystectomy Sexual abuse: No  BOWEL MOVEMENT: Pain with bowel movement: Yes Type of bowel movement:Type (Bristol Stool Scale) 1-7 (sometimes full range in the same bowel movement), Frequency  sometimes many times a day, sometimes up to 4 days in between, and Strain Yes Fully empty rectum: No Leakage: No Pads: No Fiber supplement: No - has attempted metamucil and it made constipation worse  URINATION: Pain with urination: Yes Fully empty bladder: No Stream:  varies  Urgency: Yes: all the time Frequency: multiple times an hours  Leakage: Urge to void, Walking to the bathroom, Coughing, Sneezing, Laughing, and Exercise Pads: Yes: daily  INTERCOURSE: Pain with intercourse: Initial Penetration and During Penetration Ability to have vaginal penetration:  Yes: with pain Climax: non painful WNL   PREGNANCY: Vaginal deliveries 1 Tearing Yes: 3rd degree tear C-section deliveries 1 Currently pregnant No  PROLAPSE: Periodically will feel vaginal bulging, heaviness in lower abdomen   OBJECTIVE:  11/09/22: No internal rectal or vaginal pelvic floor exam performed due to high level of skin irritation   Weak transversus abdominus contraction  Abdominal restriction and tenderness in bil lower quadrant  Unable to perform pelvic tilt with appropriate coordination  LUMBARAROM/PROM:  A/PROM A/PROM  Eval (% available) 11/09/22 (% available)  Flexion 50 90  Extension 25, pain anteriorly  25, pain in low back  Right lateral flexion 50, pain anteriorly  75, pain on right  Left lateral flexion 50, pain anteriorly  75, pain on right   (Blank rows = not tested) 07/21/22: standing prolapse assessment demonstrated grade 2 anterior vaginal wall laxity   06/08/22:               External Perineal Exam: WNL                              Internal Pelvic Floor: burning reported with palpation of superficial muscles; deep aching/pressure with palpation of Lt levator ani   Patient confirms identification and approves PT to assess internal pelvic floor and treatment Yes  PELVIC MMT:   MMT eval  Vaginal 3/5  Internal Anal Sphincter 1/5  External Anal Sphincter 2/5  Puborectalis 1/5   Diastasis Recti   (Blank rows = not tested)        TONE: High in Lt levator ani   PROLAPSE: Not able to tell this session due to dyssynergic pelvic floor contraction     06/01/22: COGNITION: Overall cognitive status: Within functional limits for tasks assessed     SENSATION: Light touch: Appears intact Proprioception: Appears intact  GAIT: Comments: decreased hip extension, forward flexed trunk  POSTURE: rounded shoulders, forward head, decreased lumbar lordosis, increased thoracic kyphosis, posterior pelvic tilt, and flexed trunk    LUMBARAROM/PROM:  A/PROM A/PROM  Eval (% available)  Flexion 50  Extension 25, pain anteriorly   Right lateral flexion 50, pain anteriorly   Left lateral flexion 50, pain anteriorly    (Blank rows = not tested)   PALPATION:   General  Significant abdominal restriction and tenderness; decreased rib mobility with mobilization and breathing    TODAY'S TREATMENT:                                                                                                                              DATE:  02/21/23 Manual: Abdominal soft tissue mobilization  Bowel mobilization Ileocecal valve myofascial  release Neuromuscular re-education: Bridge with hip adduction, transversus abdominus, and pelvic floor muscle 2 x 10 Bridge with bil abduction, transversus abdominus, and pelvic floor muscle 2 x 10, red band  Supine resisted march red band 2 x 10 Supine leg extensions 10x bil   02/14/23 Manual: Abdominal soft tissue mobilization  Bowel mobilization Ileocecal valve myofascial release Neuromuscular re-education: Bil supine UE ball press with transversus abdominus and pelvic floor muscle contractions and breath coordination 10x Supine hip adduction ball press with transversus abdominus and pelvic floor muscle contractions and breath coordination 10x Bridge with hip adduction, transversus abdominus, and pelvic floor muscle 2 x 10 Supine leg  extensions with transversus abdominus activation 10x bil   02/07/23 Manual: Abdominal soft tissue mobilization  Bowel mobilization Ileocecal valve myofascial release Neuromuscular re-education: Transversus abdominus training with multimodal cues for improved motor control and breath coordination  Transversus abdominus isometrics 10x Bil supine UE ball press with transversus abdominus and pelvic floor muscle contractions and breath coordination 10x Supine hip adduction ball press with transversus abdominus and pelvic floor muscle contractions and breath coordination 10x Bridge with hip adduction, transversus abdominus, and pelvic floor muscle 2 x 10 Seated hip abduction red band with transversus abdominus and pelvic floor muscle 2 x 10 Seated resisted march red band with transversus abdominus and pelvic floor muscle 2 x 10    PATIENT EDUCATION:  Education details: See above Person educated: Patient Education method: Programmer, multimedia, Facilities manager, Actor cues, Verbal cues, and Handouts Education comprehension: verbalized understanding  HOME EXERCISE PROGRAM: [id number]  ASSESSMENT:  CLINICAL IMPRESSION: Pt continuing to feel like she is making improvements and finding some stability with emptying bowels. She will feel constipated, but increasing activity helps reduce. We continued bowel mobilization due to benefit in comfort and motility that patient feels like she gets. She tolerated exercise progressions well, demonstrating some improved endurance. She will continue to benefit from skilled PT intervention in order to decrease pain, improve bowel movements, and decrease urinary urgency/incontinence.     OBJECTIVE IMPAIRMENTS: decreased activity tolerance, decreased coordination, decreased endurance, decreased mobility, decreased strength, increased fascial restrictions, increased muscle spasms, impaired tone, postural dysfunction, and pain.   ACTIVITY LIMITATIONS: lifting, bending,  continence, and locomotion level  PARTICIPATION LIMITATIONS: interpersonal relationship, community activity, and occupation  PERSONAL FACTORS: 1 comorbidity: medical history  are also affecting patient's functional outcome.   REHAB POTENTIAL: Good  CLINICAL DECISION MAKING: Stable/uncomplicated  EVALUATION COMPLEXITY: Low   GOALS: Goals reviewed with patient? Yes  SHORT TERM GOALS: Target date: 07/06/22 - updated 07/12/22 - updated 08/18/22 - updated 09/22/22 - updated 10/26/22 - updated 11/09/22 - updated 12/15/22 updated 01/18/23  Pt will be independent with HEP.   Baseline: Goal status: MET 07/12/22  2.  Pt will be independent with diaphragmatic breathing and down training activities in order to improve pelvic floor relaxation.  Baseline:  Goal status: MET 07/12/22  3.  Pt will be independent with the knack, urge suppression technique, and double voiding in order to improve bladder habits and decrease urinary incontinence.   Baseline:  Goal status: MET 09/22/22  4.  Pt will be independent with use of squatty potty, relaxed toileting mechanics, and improved bowel movement techniques in order to increase ease of bowel movements and complete evacuation.   Baseline:  Goal status: MET 11/09/22  5.  Pt will be able to correctly perform diaphragmatic breathing and appropriate pressure management in order to prevent worsening vaginal wall laxity and improve pelvic floor A/ROM.   Baseline:  Goal status: MET 01/18/23  LONG TERM GOALS: Target date: 11/16/2022 - updated 07/12/22 - updated 08/18/22 - updated 09/22/22 - updated 10/26/22 - updated 11/09/22 - updated 12/15/22 - updated 02/21/23  Pt will be independent with advanced HEP.   Baseline:  Goal status: IN PROGRESS 02/21/23  2.  Pt will demonstrate normal pelvic floor muscle tone and A/ROM, able to achieve 4/5 strength with contractions and 10 sec endurance, in order to provide appropriate lumbopelvic support in functional activities.    Baseline:  Goal status: IN PROGRESS 02/21/23  3.  Pt will increase all impaired lumbar A/ROM by 25% without pain.  Baseline:  Goal status:  IN PROGRESS 02/21/23  4.  Pt will report pain no higher than 3/10 with any activity. Baseline:  Goal status:  IN PROGRESS 02/21/23  5.  Pt will report 0/10 pain with vaginal penetration in order to improve intimate relationship with partner.    Baseline:  Goal status:  IN PROGRESS 02/21/23  6.  Pt will be able to go 2-3 hours in between voids without urgency or incontinence in order to improve QOL and perform all functional activities with less difficulty.   Baseline:  Goal status: MET 02/21/23  7.  Pt will report no leaks with laughing, coughing, sneezing in order to improve comfort with interpersonal relationships and community activities.   Baseline:  Goal status:  IN PROGRESS 02/21/23  8.  Pt will have consistent bowel movements 4-5x/week without straining or pain.  Baseline:  Goal status:  MET 02/21/23  PLAN:  PT FREQUENCY: 1-2x/week  PT DURATION: 6 months  PLANNED INTERVENTIONS: Therapeutic exercises, Therapeutic activity, Neuromuscular re-education, Balance training, Gait training, Patient/Family education, Self Care, Joint mobilization, Dry Needling, Biofeedback, and Manual therapy  PLAN FOR NEXT SESSION: Continue to work on motor control activities surrounding pelvis; progress mobility; manual techniques as needed for comfort and to increase circulation.   Julio Alm, PT, DPT02/18/253:30 PM

## 2023-02-21 NOTE — Therapy (Signed)
OUTPATIENT OCCUPATIONAL THERAPY TREATMENT NOTE  L LOWER EXTREMITY/ B LOWER QUADRANT LYMPHEDEMA  Patient Name: Karen [name] MRN: [id number] DOB:[birth year], 53 y.o., female Today's Date: 02/21/2023  END OF SESSION:   OT End of Session - 02/21/23 1011     Visit Number 25    Number of Visits 36    Date for OT Re-Evaluation 05/17/23    OT Start Time 1000    OT Stop Time 1100    OT Time Calculation (min) 60 min    Activity Tolerance Patient tolerated treatment well;No increased pain    Behavior During Therapy [facility] for tasks assessed/performed               Past Medical History:  Diagnosis Date   Adenomyosis    Anemia    Arthritis 2013   L knee gets steroid injections    Asthmatic bronchitis 01/31/2017   Back pain    Chronic constipation    Diarrhea    DVT of lower extremity (deep venous thrombosis) (HCC)    Dysmenorrhea    Endometriosis    Esophageal stricture    GERD (gastroesophageal reflux disease)    History of kidney stones    History of uterine fibroid    IBS (irritable bowel syndrome)    Interstitial cystitis    Lactose intolerance    Migraine    with aura   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   PONV (postoperative nausea and vomiting)    Recurrent upper respiratory infection (URI)    Sebaceous cyst of breast, right lower inner quadrant 10/25/2012   Excised 11/21/12    Sleep apnea    Syncope, non cardiac    Past Surgical History:  Procedure Laterality Date   ARTHROSCOPIC HAGLUNDS REPAIR     BREAST CYST EXCISION Right 11/21/2012   Procedure: CYST EXCISION BREAST;  Surgeon: Currie Paris, MD;  Location: [facility];  Service: General;  Laterality: Right;   BREAST CYST EXCISION Bilateral 01/18/2022   Procedure: EXCISION OF SEBACEOUS CYST BILATERAL BREAST;  Surgeon: Harriette Bouillon, MD;  Location: [facility];  Service: General;  Laterality: Bilateral;   BREAST EXCISIONAL BIOPSY Right 11/2012   CESAREAN SECTION  04/19/1993    CHOLECYSTECTOMY     COLONOSCOPY  05/02/2011   Procedure: COLONOSCOPY;  Surgeon: Vertell Novak., MD;  Location: [facility];  Service: Endoscopy;  Laterality: N/A;   colonoscopy  11/04/2014   DENTAL SURGERY  03/06/2018   2 surgeries ( 03/06/2018 and 04/06/2018)   to remove two separate benign tumors   ESOPHAGEAL MANOMETRY N/A 11/04/2015   Procedure: ESOPHAGEAL MANOMETRY (EM);  Surgeon: Carman Ching, MD;  Location: [facility];  Service: Endoscopy;  Laterality: N/A;   ESOPHAGOGASTRODUODENOSCOPY  05/02/2011   Procedure: ESOPHAGOGASTRODUODENOSCOPY (EGD);  Surgeon: Vertell Novak., MD;  Location: [facility];  Service: Endoscopy;  Laterality: N/A;   ESOPHAGOGASTRODUODENOSCOPY N/A 09/01/2014   Procedure: ESOPHAGOGASTRODUODENOSCOPY (EGD);  Surgeon: Carman Ching, MD;  Location: [facility];  Service: Endoscopy;  Laterality: N/A;   KNEE SURGERY     LAPAROSCOPY     x 4   LESION REMOVAL N/A 01/18/2022   Procedure: EXCISION OF SEBACEOUS CYSTS CHEST AND NECK;  Surgeon: Harriette Bouillon, MD;  Location: [facility];  Service: General;  Laterality: N/A;   PH IMPEDANCE STUDY N/A 11/04/2015   Procedure: PH IMPEDANCE STUDY;  Surgeon: Carman Ching, MD;  Location: [facility];  Service: Endoscopy;  Laterality: N/A;   VAGINAL HYSTERECTOMY  2010   TVH--ovaries  remain   Patient Active Problem List   Diagnosis Date Noted   Sjogren syndrome with keratoconjunctivitis (HCC) 12/09/2022   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   Elevated LFTs 04/04/2021   Colitis 04/04/2021   Bacterial overgrowth syndrome 05/21/2020   Obesity 05/21/2020   History of endometriosis 05/21/2020   Constipation due to outlet dysfunction 02/05/2020   Gastroesophageal reflux disease 02/05/2020   Obstructive sleep apnea treated with continuous positive airway pressure (CPAP) 06/16/2017   Perennial allergic rhinitis with a nonallergic component 01/31/2017   Asthmatic bronchitis 01/31/2017   GI symptoms 01/31/2017    Family history of colon cancer 01/27/2015   Chest pain 12/13/2012   Dyspnea 12/13/2012   DVT of lower extremity (deep venous thrombosis) (HCC) 01/03/1990    PCP: Coral Else, MD  REFERRING PROVIDER: Lorenda Ishihara, MD  REFERRING DIAG: I89.0  THERAPY DIAG:  Lymphedema, not elsewhere classified  Rationale for Evaluation and Treatment: Rehabilitation  ONSET DATE: ~2014; exacerbation in 2023  SUBJECTIVE:                                                                                                                                                                                           SUBJECTIVE STATEMENT: Pt presents for OT to address lower quadrant and LLE lymphedema. Pt rates LE-related pain in her abdomen as 3/10 this morning. Pt reports leg pain 4/10. Pt states she continues walking for exercise on a regular basis. Pt states she walked for exercise yesterday and did not have stinging in her thigh as usual after massage yesterday. She has no new complaints.   PERTINENT HISTORY: CVI, OSA (no CPAP), GERD, Esophageal stricture, IBS, Lactose intolerant, Diarrhea, Hx LE DVT  PAIN:  Are you having pain? Yes: NPRS scale: see SUBJECTIVE/10 Pain location: BLE Pain description: myalgia, tight, heavy, tingling, numbness Aggravating factors: standing, walking, dependent sitting > 30 minutes Relieving factors: elevation  PRECAUTIONS: Other: LYMPHEDEMA; Hx LLE DVT  RED FLAGS: Hx LLE DVT  WEIGHT BEARING RESTRICTIONS: No  FALLS:  Has patient fallen in last 6 months? No  LIVING ENVIRONMENT: Lives with: lives with spouse and daughter Lives in: House/apartment Stairs: Yes; Internal: 14 steps; yes and External: 4 steps; yes Has following equipment at home: None  OCCUPATION: [employer] operational officer, walking, desk work  LEISURE: family time  HAND DOMINANCE: right   PRIOR LEVEL OF FUNCTION: Independent  PATIENT GOALS: Feel better, be able to be more active without pain,  wear preferred street shoes  OBJECTIVE: Note: Objective measures were completed at Evaluation unless otherwise noted.  COGNITION:  Overall cognitive status: Within functional limits for tasks assessed   OBSERVATIONS / OTHER ASSESSMENTS:  POSTURE: WFL  LE ROM: WFL  LE MMT: WFL  LYMPHEDEMA ASSESSMENTS: non-Ca related  INFECTIONS: denies cellulitis and wound hx  BLE COMPARATIVE LIMB VOLUMETRICS Initial 11/15/22  LANDMARK RIGHT (dominant)  R LEG (A-D) 3028.9 ml  R THIGH (E-G) 4809.60 ml  R FULL LIMB (A-G) 7838.5 ml  Limb Volume differential (LVD)  %  Volume change since initial %  Volume change overall V  (Blank rows = not tested)  LANDMARK LEFT  (Rx)  L LEG (A-D) 3189.9 ml  L THIGH (E-G) 4959.8 ml  L FULL LIMB (A-G) 8149.7 ml  Limb Volume differential (LVD)  LEG LVD = 5.32%, L>R; THIGH LVD = 3.12%, L>R; And full LLE LVD = 3.97%, L>R. 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 Obesity  Skin  Description Hyper-Keratosis Peau d' Orange Shiny Tight Fibrotic/ Indurated Fatty Doughy Spongy/ boggy       R>L x  x   Skin dry Flaky WNL Macerated   mildly      Color Redness Varicosities Blanching Hemosiderin Stain Mottled        x   Odor Malodorous Yeast Fungal infection  WNL      x   Temperature Warm Cool wnl     x    Pitting Edema   1+ 2+ 3+ 4+ Non-pitting         x   Girth Symmetrical Asymmetrical                   Distribution    L>R toes to groin    Stemmer Sign Positive Negative   +L -R   Lymphorrhea History Of:  Present Absent     x    Wounds History Of Present Absent Venous Arterial Pressure Sheer     x        Signs of Infection Redness Warmth Erythema Acute Swelling Drainage Borders                    Sensation Light Touch Deep pressure Hypersensitivity   In tact Impaired In tact Impaired Absent Impaired   x  x  x      Nails WNL   Fungus nail dystrophy   x     Hair Growth Symmetrical Asymmetrical   x    Skin Creases Base of toes  Ankles   Base of Fingers knees       Abdominal pannus Thigh Lobules  Face/neck   x           GAIT: Distance walked: >500' Assistive device utilized: None Level of assistance: Complete Independence Comments: Functional ambulation and transfers [facility]  LYMPHEDEMA LIFE IMPACT SCALE (LLIS): Initial 11/15/22  50%  FOTO functional outcome measure: Deferred. FOTO discontinued.  TODAY'S TREATMENT:  Pt edu re lymphedema self management- simple self MLD MLD to BLQ emphasis on colon and LLE  PATIENT EDUCATION:  Continued Pt/ CG edu for how function of cisterna chyli, part of the thoracic duct of deep abdominal lymphatics, assists with digestion by filtering many of the fats and proteins from the digestive system. Sub optimal lymphatic flow in this region may result in constipation, bloating, swelling, etc. MLD helps mobilize these protein and fats , and the rhythmic movement of manual lymphatic drainage stimulates digestive muscles and increase peristalsis. Provided printed resource for reference.  Topics include outcome of comparative limb volumetrics- starting limb volume differentials (LVDs), technology and gradient techniques used for short stretch, multilayer compression wrapping, simple self-MLD, therapeutic lymphatic pumping exercises, skin/nail care, LE precautions,. compression garment recommendations and specifications, wear and care schedule and compression garment donning / doffing w assistive devices. Discussed progress towards all OT goals since commencing CDT. All questions answered to the Pt's satisfaction. Good return. Person educated: Patient  Education method: Explanation, Demonstration, and Handouts Education comprehension:  verbalized understanding, returned demonstration, verbal cues required, and needs further education  HOME EXERCISE PROGRAM: BLE lymphatic pumping there ex using- 1 sets of 10 reps, each exercise in order-  1-2 x daily, bilaterally Simple self MLD 1 x daily Daily skin care to increase hydration, skin mobility and decrease infection risk- can be done during MLD Compression wraps 23/7 during intensive phase of CDT Compression garments/ devices during self-management phase of CDT  ASSESSMENT:  CLINICAL IMPRESSION: Continued lymphedema self care home program training w emphasis on simple self-MLD.  Pt tolerated abdominal MLD and MLD to LLE/LLQ without increased pain.  Pt  continues to benefit from skilled OT for Lymphedema care to reduce pain and digestive discomfort. MLD to the LLE/LLQ has helped relieve tingling pain  in the thigh w exercise. Introduced the idea of using full length, off the shelf compression leggings for exercise to reduce muscle fatigue and assist w lymphatic circulation when more active for short periods of time several cx weekly. Cont as per POC.  11/15/22 Initial Evaluation: Tyson Masin is a 53 y.o. female presenting with very mild, stage II, LLE lymphedema 2/2 venous insufficiency and obesity. L Leg swelling and associated pain swelling fluctuates. It has progressed over time, and now no longer resolves with elevation over night. RLE swelling limits balance during functional ambulation. It exacerbates infection and falls risk. LLE/RLQ lymphedema interferes with functional performance in all occupational domains, including basic and instrumental ADLs, productive activities, leisure pursuits and social participation. Pt will benefit from Occupational Therapy for Complete Decongestive Therapy (CDT) to restore function, to reduce physical and psychologic suffering associated with chronic, progressive lymphedema and associated pain, and to limit infection. CDT will include manual  lymphatic drainage (MLD), skin care, therapeutic exercise and compression wraps and garment's devices. Without skilled OT for lymphedema care the condition will worsen and further functional decline is expected  Custom-made gradient compression garments and HOS devices are medically necessary because they are uniquely sized and shaped to fit the exact dimensions of the affected extremities, and to provide appropriate medical grade, graduated compression essential for optimally managing chronic, progressive lymphedema. Multiple custom compression garments are needed to ensure proper hygiene to limit infection risk. Custom compression garments should be replaced q 3-6 months When worn consistently for optimal lipo-lymphedema self-management over time. HOS devices, medically necessary to limit fibrosis buildup in tissue, should be replaced q 2 years and PRN when worn out.  OBJECTIVE IMPAIRMENTS: decreased activity tolerance, decreased knowledge of condition, decreased knowledge of use of DME, increased edema, impaired sensation, pain, and chronic, progressive leg swelling .   ACTIVITY LIMITATIONS: dependent sitting, extended standing and/ or walking, squatting, and lower body dressing, fitting preferred street shoes  PARTICIPATION LIMITATIONS: shopping, community activity, occupation, and yard work  PERSONAL FACTORS: 3+ comorbidities: Hx DVT,  OSA, CVI. Varicose veins  are also affecting patient's functional outcome.   REHAB POTENTIAL: Good  EVALUATION COMPLEXITY: Moderate   GOALS: Goals reviewed with patient? Yes  SHORT TERM GOALS: Target date: 4th OT Rx visit  SHORT TERM GOALS: Target date: 4th OT Rx visit   Pt will demonstrate understanding of lymphedema precautions and prevention strategies with modified independence using a printed reference to identify at least 5 precautions and discussing how s/he may implement them into daily life to reduce risk of progression with extra  time. Baseline:Max A Goal status: GOAL MET   Pt will be able to apply multilayer, knee length, gradient, compression wraps to one leg at a time with modified assistance (extra time and assistive device/s) to decrease limb volume, to limit infection risk, and to limit lymphedema progression.  Baseline: Dependent Goal status: GOAL DISCHARGED. Pt Going to compression garments instead  LONG TERM GOALS: Target date: 02/08/23  Given this patient's Intake score of 67%/100% on the functional outcomes FOTO tool, patient will experience an increase in function of 5 points to improve basic and instrumental ADLs performance, including lymphedema self-care.  Baseline: Max A Goal status:GOAL DISCHARGED.  FOTO TOOL DISCONTINUED AT CLINIC   Given this patient's Intake score of 50% on the Lymphedema Life Impact Scale (LLIS), patient will experience a reduction of at least 5 points in her perceived level of functional impairment resulting from lymphedema to improve functional performance and quality of life (QOL). Baseline: % Goal status: PROGRESSING  Pt will achieve at least a 10% volume reduction in B legs to return limb to typical size and shape, to limit infection risk and LE progression, to decrease pain, to improve function. Baseline: Dependent Goal status: PROGRESSING  4.  Pt will obtain proper compression garments/devices and achieve modified independence (extra time + assistive devices) with donning/doffing to optimize limb volume reductions and limit LE  progression over time. Baseline:  Goal status: ONGOING. Garments  5.  During Intensive phase CDT , with modified independence, Pt will achieve at least 85% compliance with all lymphedema self-care home program components, including daily skin care, compression wraps and /or garments, simple self MLD and lymphatic pumping therex to habituate LE self care protocol  into ADLs for optimal LE self-management over time. Baseline: Dependent Goal status:  PROGRESSING   PLAN:  OT FREQUENCY: 2x/week  OT DURATION: 12 weeks  PLANNED INTERVENTIONS: 97110-Therapeutic exercises, 97530- Therapeutic activity, 97535- Self Care, 16109- Manual therapy, Patient/Family education, Manual lymph drainage, Compression bandaging, and fit with appropriate compression garments  PLAN FOR NEXT SESSION:  Cont MLD Cont Pt edu for LE self-care  Loel Dubonnet, MS, OTR/L, CLT-LANA 02/21/23 3:15 PM '

## 2023-02-23 ENCOUNTER — Ambulatory Visit: Payer: Commercial Managed Care - PPO | Admitting: Occupational Therapy

## 2023-02-24 ENCOUNTER — Other Ambulatory Visit: Payer: Self-pay | Admitting: Hematology & Oncology

## 2023-02-24 ENCOUNTER — Other Ambulatory Visit (HOSPITAL_COMMUNITY): Payer: Self-pay

## 2023-02-24 ENCOUNTER — Other Ambulatory Visit: Payer: Self-pay | Admitting: *Deleted

## 2023-02-24 ENCOUNTER — Encounter: Payer: Self-pay | Admitting: Hematology & Oncology

## 2023-02-24 ENCOUNTER — Inpatient Hospital Stay: Payer: Commercial Managed Care - PPO | Attending: Hematology & Oncology

## 2023-02-24 DIAGNOSIS — E039 Hypothyroidism, unspecified: Secondary | ICD-10-CM

## 2023-02-24 DIAGNOSIS — R790 Abnormal level of blood mineral: Secondary | ICD-10-CM

## 2023-02-24 DIAGNOSIS — Z79620 Long term (current) use of immunosuppressive biologic: Secondary | ICD-10-CM | POA: Insufficient documentation

## 2023-02-24 LAB — CBC WITH DIFFERENTIAL (CANCER CENTER ONLY)
Abs Immature Granulocytes: 0.01 10*3/uL (ref 0.00–0.07)
Basophils Absolute: 0 10*3/uL (ref 0.0–0.1)
Basophils Relative: 1 %
Eosinophils Absolute: 0.2 10*3/uL (ref 0.0–0.5)
Eosinophils Relative: 4 %
HCT: 39 % (ref 36.0–46.0)
Hemoglobin: 12.7 g/dL (ref 12.0–15.0)
Immature Granulocytes: 0 %
Lymphocytes Relative: 24 %
Lymphs Abs: 1 10*3/uL (ref 0.7–4.0)
MCH: 32.5 pg (ref 26.0–34.0)
MCHC: 32.6 g/dL (ref 30.0–36.0)
MCV: 99.7 fL (ref 80.0–100.0)
Monocytes Absolute: 0.3 10*3/uL (ref 0.1–1.0)
Monocytes Relative: 8 %
Neutro Abs: 2.6 10*3/uL (ref 1.7–7.7)
Neutrophils Relative %: 63 %
Platelet Count: 203 10*3/uL (ref 150–400)
RBC: 3.91 MIL/uL (ref 3.87–5.11)
RDW: 11.8 % (ref 11.5–15.5)
WBC Count: 4.1 10*3/uL (ref 4.0–10.5)
nRBC: 0 % (ref 0.0–0.2)

## 2023-02-24 LAB — CMP (CANCER CENTER ONLY)
ALT: 22 U/L (ref 0–44)
AST: 16 U/L (ref 15–41)
Albumin: 4.3 g/dL (ref 3.5–5.0)
Alkaline Phosphatase: 112 U/L (ref 38–126)
Anion gap: 6 (ref 5–15)
BUN: 11 mg/dL (ref 6–20)
CO2: 30 mmol/L (ref 22–32)
Calcium: 10 mg/dL (ref 8.9–10.3)
Chloride: 105 mmol/L (ref 98–111)
Creatinine: 0.75 mg/dL (ref 0.44–1.00)
GFR, Estimated: >60 mL/min (ref >60)
Glucose, Bld: 110 mg/dL — ABNORMAL HIGH (ref 70–99)
Potassium: 4.3 mmol/L (ref 3.5–5.1)
Sodium: 141 mmol/L (ref 135–145)
Total Bilirubin: 0.5 mg/dL (ref 0.0–1.2)
Total Protein: 7.2 g/dL (ref 6.5–8.1)

## 2023-02-24 LAB — MAGNESIUM: Magnesium: 2 mg/dL (ref 1.7–2.4)

## 2023-02-24 LAB — RETICULOCYTES
Immature Retic Fract: 13.6 % (ref 2.3–15.9)
RBC.: 3.85 MIL/uL — ABNORMAL LOW (ref 3.87–5.11)
Retic Count, Absolute: 93.2 10*3/uL (ref 19.0–186.0)
Retic Ct Pct: 2.4 % (ref 0.4–3.1)

## 2023-02-24 LAB — IRON AND IRON BINDING CAPACITY (CC-WL,HP ONLY)
Iron: 67 ug/dL (ref 28–170)
Saturation Ratios: 21 % (ref 10.4–31.8)
TIBC: 326 ug/dL (ref 250–450)
UIBC: 259 ug/dL (ref 148–442)

## 2023-02-24 LAB — TSH: TSH: 0.534 u[IU]/mL (ref 0.350–4.500)

## 2023-02-24 LAB — FERRITIN: Ferritin: 223 ng/mL (ref 11–307)

## 2023-02-27 ENCOUNTER — Other Ambulatory Visit (HOSPITAL_COMMUNITY): Payer: Self-pay

## 2023-02-27 ENCOUNTER — Encounter: Payer: Self-pay | Admitting: *Deleted

## 2023-02-28 ENCOUNTER — Ambulatory Visit: Payer: Commercial Managed Care - PPO | Admitting: Occupational Therapy

## 2023-02-28 ENCOUNTER — Encounter: Payer: Self-pay | Admitting: Occupational Therapy

## 2023-02-28 DIAGNOSIS — R109 Unspecified abdominal pain: Secondary | ICD-10-CM | POA: Diagnosis not present

## 2023-02-28 DIAGNOSIS — N200 Calculus of kidney: Secondary | ICD-10-CM | POA: Diagnosis not present

## 2023-02-28 DIAGNOSIS — I890 Lymphedema, not elsewhere classified: Secondary | ICD-10-CM

## 2023-02-28 DIAGNOSIS — K5900 Constipation, unspecified: Secondary | ICD-10-CM | POA: Diagnosis not present

## 2023-02-28 NOTE — Therapy (Deleted)
 OUTPATIENT PHYSICAL THERAPY FEMALE PELVIC TREATMENT   Patient Name: Karen [name] MRN: [id number] DOB:[birth year], 53 y.o., female Today's Date: 02/28/2023  END OF SESSION:            Past Medical History:  Diagnosis Date   Adenomyosis    Anemia    Arthritis 2013   L knee gets steroid injections    Asthmatic bronchitis 01/31/2017   Back pain    Chronic constipation    Diarrhea    DVT of lower extremity (deep venous thrombosis) (HCC)    Dysmenorrhea    Endometriosis    Esophageal stricture    GERD (gastroesophageal reflux disease)    History of kidney stones    History of uterine fibroid    IBS (irritable bowel syndrome)    Interstitial cystitis    Lactose intolerance    Migraine    with aura   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   PONV (postoperative nausea and vomiting)    Recurrent upper respiratory infection (URI)    Sebaceous cyst of breast, right lower inner quadrant 10/25/2012   Excised 11/21/12    Sleep apnea    Syncope, non cardiac    Past Surgical History:  Procedure Laterality Date   ARTHROSCOPIC HAGLUNDS REPAIR     BREAST CYST EXCISION Right 11/21/2012   Procedure: CYST EXCISION BREAST;  Surgeon: Currie Paris, MD;  Location: [facility];  Service: General;  Laterality: Right;   BREAST CYST EXCISION Bilateral 01/18/2022   Procedure: EXCISION OF SEBACEOUS CYST BILATERAL BREAST;  Surgeon: Harriette Bouillon, MD;  Location: [facility];  Service: General;  Laterality: Bilateral;   BREAST EXCISIONAL BIOPSY Right 11/2012   CESAREAN SECTION  04/19/1993   CHOLECYSTECTOMY     COLONOSCOPY  05/02/2011   Procedure: COLONOSCOPY;  Surgeon: Vertell Novak., MD;  Location: [facility];  Service: Endoscopy;  Laterality: N/A;   colonoscopy  11/04/2014   DENTAL SURGERY  03/06/2018   2 surgeries ( 03/06/2018 and 04/06/2018)   to remove two separate benign tumors   ESOPHAGEAL MANOMETRY N/A 11/04/2015   Procedure: ESOPHAGEAL  MANOMETRY (EM);  Surgeon: Carman Ching, MD;  Location: [facility];  Service: Endoscopy;  Laterality: N/A;   ESOPHAGOGASTRODUODENOSCOPY  05/02/2011   Procedure: ESOPHAGOGASTRODUODENOSCOPY (EGD);  Surgeon: Vertell Novak., MD;  Location: [facility];  Service: Endoscopy;  Laterality: N/A;   ESOPHAGOGASTRODUODENOSCOPY N/A 09/01/2014   Procedure: ESOPHAGOGASTRODUODENOSCOPY (EGD);  Surgeon: Carman Ching, MD;  Location: [facility];  Service: Endoscopy;  Laterality: N/A;   KNEE SURGERY     LAPAROSCOPY     x 4   LESION REMOVAL N/A 01/18/2022   Procedure: EXCISION OF SEBACEOUS CYSTS CHEST AND NECK;  Surgeon: Harriette Bouillon, MD;  Location: [facility];  Service: General;  Laterality: N/A;   PH IMPEDANCE STUDY N/A 11/04/2015   Procedure: PH IMPEDANCE STUDY;  Surgeon: Carman Ching, MD;  Location: [facility];  Service: Endoscopy;  Laterality: N/A;   VAGINAL HYSTERECTOMY  2010   TVH--ovaries remain   Patient Active Problem List   Diagnosis Date Noted   Sjogren syndrome with keratoconjunctivitis (HCC) 12/09/2022   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   Elevated LFTs 04/04/2021   Colitis 04/04/2021   Bacterial overgrowth syndrome 05/21/2020   Obesity 05/21/2020   History of endometriosis 05/21/2020   Constipation due to outlet dysfunction 02/05/2020   Gastroesophageal reflux disease 02/05/2020   Obstructive sleep apnea treated with continuous positive airway pressure (CPAP) 06/16/2017   Perennial allergic rhinitis with a  nonallergic component 01/31/2017   Asthmatic bronchitis 01/31/2017   GI symptoms 01/31/2017   Family history of colon cancer 01/27/2015   Chest pain 12/13/2012   Dyspnea 12/13/2012   DVT of lower extremity (deep venous thrombosis) (HCC) 01/03/1990    PCP: Lorenda Ishihara, MD  REFERRING PROVIDER: Jerene Bears, MD   REFERRING DIAG: [phone] (ICD-10-CM) - Pelvic floor dysfunction  THERAPY DIAG:  Lymphedema, not elsewhere classified  Rationale  for Evaluation and Treatment: Rehabilitation  ONSET DATE: chronic  SUBJECTIVE:                                                                                                                                                                                           SUBJECTIVE STATEMENT:  Pt states that she is still doing better. She was starting to feel constipated over the weekend, but she reports walking helped get things moving.    PAIN: 02/21/23 Are you having pain? Yes NPRS scale: 2/10 Pain location:  Lower abdominal pain, Lt sided pain, low back pain  Pain type: turning, knotted, pressure Pain description: intermittent and constant   Aggravating factors: constipation or frequent bowel movements/constantly having bowel movements  Relieving factors: exercises (bowel massage, happy baby, spinal twists, walking)  PRECAUTIONS: None  WEIGHT BEARING RESTRICTIONS: No  FALLS:  Has patient fallen in last 6 months? No  LIVING ENVIRONMENT: Lives with: lives with their spouse Lives in: House/apartment  OCCUPATION: nurse, in admin  PLOF: Independent  PATIENT GOALS: decrease pain and have more normal bowel movements  PERTINENT HISTORY:  Vaginal hysterectomy, DVT LE, endometriosis, interstitial cystitis, exploratory laps for endometriosis, c-section, cholecystectomy Sexual abuse: No  BOWEL MOVEMENT: Pain with bowel movement: Yes Type of bowel movement:Type (Bristol Stool Scale) 1-7 (sometimes full range in the same bowel movement), Frequency sometimes many times a day, sometimes up to 4 days in between, and Strain Yes Fully empty rectum: No Leakage: No Pads: No Fiber supplement: No - has attempted metamucil and it made constipation worse  URINATION: Pain with urination: Yes Fully empty bladder: No Stream:  varies  Urgency: Yes: all the time Frequency: multiple times an hours  Leakage: Urge to void, Walking to the bathroom, Coughing, Sneezing, Laughing, and Exercise Pads:  Yes: daily  INTERCOURSE: Pain with intercourse: Initial Penetration and During Penetration Ability to have vaginal penetration:  Yes: with pain Climax: non painful WNL   PREGNANCY: Vaginal deliveries 1 Tearing Yes: 3rd degree tear C-section deliveries 1 Currently pregnant No  PROLAPSE: Periodically will feel vaginal bulging, heaviness in lower abdomen   OBJECTIVE:  11/09/22: No internal rectal or vaginal pelvic floor exam performed due to high  level of skin irritation   Weak transversus abdominus contraction  Abdominal restriction and tenderness in bil lower quadrant  Unable to perform pelvic tilt with appropriate coordination  LUMBARAROM/PROM:  A/PROM A/PROM  Eval (% available) 11/09/22 (% available)  Flexion 50 90  Extension 25, pain anteriorly  25, pain in low back  Right lateral flexion 50, pain anteriorly  75, pain on right  Left lateral flexion 50, pain anteriorly  75, pain on right   (Blank rows = not tested) 07/21/22: standing prolapse assessment demonstrated grade 2 anterior vaginal wall laxity   06/08/22:               External Perineal Exam: WNL                              Internal Pelvic Floor: burning reported with palpation of superficial muscles; deep aching/pressure with palpation of Lt levator ani   Patient confirms identification and approves PT to assess internal pelvic floor and treatment Yes  PELVIC MMT:   MMT eval  Vaginal 3/5  Internal Anal Sphincter 1/5  External Anal Sphincter 2/5  Puborectalis 1/5  Diastasis Recti   (Blank rows = not tested)        TONE: High in Lt levator ani   PROLAPSE: Not able to tell this session due to dyssynergic pelvic floor contraction     06/01/22: COGNITION: Overall cognitive status: Within functional limits for tasks assessed     SENSATION: Light touch: Appears intact Proprioception: Appears intact  GAIT: Comments: decreased hip extension, forward flexed trunk  POSTURE: rounded shoulders,  forward head, decreased lumbar lordosis, increased thoracic kyphosis, posterior pelvic tilt, and flexed trunk    LUMBARAROM/PROM:  A/PROM A/PROM  Eval (% available)  Flexion 50  Extension 25, pain anteriorly   Right lateral flexion 50, pain anteriorly   Left lateral flexion 50, pain anteriorly    (Blank rows = not tested)   PALPATION:   General  Significant abdominal restriction and tenderness; decreased rib mobility with mobilization and breathing    TODAY'S TREATMENT:                                                                                                                              DATE:  02/21/23 Manual: Abdominal soft tissue mobilization  Bowel mobilization Ileocecal valve myofascial release Neuromuscular re-education: Bridge with hip adduction, transversus abdominus, and pelvic floor muscle 2 x 10 Bridge with bil abduction, transversus abdominus, and pelvic floor muscle 2 x 10, red band  Supine resisted march red band 2 x 10 Supine leg extensions 10x bil   02/14/23 Manual: Abdominal soft tissue mobilization  Bowel mobilization Ileocecal valve myofascial release Neuromuscular re-education: Bil supine UE ball press with transversus abdominus and pelvic floor muscle contractions and breath coordination 10x Supine hip adduction ball press with transversus abdominus and pelvic floor muscle contractions and breath coordination 10x Bridge  with hip adduction, transversus abdominus, and pelvic floor muscle 2 x 10 Supine leg extensions with transversus abdominus activation 10x bil   02/07/23 Manual: Abdominal soft tissue mobilization  Bowel mobilization Ileocecal valve myofascial release Neuromuscular re-education: Transversus abdominus training with multimodal cues for improved motor control and breath coordination  Transversus abdominus isometrics 10x Bil supine UE ball press with transversus abdominus and pelvic floor muscle contractions and breath coordination  10x Supine hip adduction ball press with transversus abdominus and pelvic floor muscle contractions and breath coordination 10x Bridge with hip adduction, transversus abdominus, and pelvic floor muscle 2 x 10 Seated hip abduction red band with transversus abdominus and pelvic floor muscle 2 x 10 Seated resisted march red band with transversus abdominus and pelvic floor muscle 2 x 10    PATIENT EDUCATION:  Education details: See above Person educated: Patient Education method: Programmer, multimedia, Facilities manager, Actor cues, Verbal cues, and Handouts Education comprehension: verbalized understanding  HOME EXERCISE PROGRAM: [id number]  ASSESSMENT:  CLINICAL IMPRESSION: Pt continuing to feel like she is making improvements and finding some stability with emptying bowels. She will feel constipated, but increasing activity helps reduce. We continued bowel mobilization due to benefit in comfort and motility that patient feels like she gets. She tolerated exercise progressions well, demonstrating some improved endurance. She will continue to benefit from skilled PT intervention in order to decrease pain, improve bowel movements, and decrease urinary urgency/incontinence.     OBJECTIVE IMPAIRMENTS: decreased activity tolerance, decreased coordination, decreased endurance, decreased mobility, decreased strength, increased fascial restrictions, increased muscle spasms, impaired tone, postural dysfunction, and pain.   ACTIVITY LIMITATIONS: lifting, bending, continence, and locomotion level  PARTICIPATION LIMITATIONS: interpersonal relationship, community activity, and occupation  PERSONAL FACTORS: 1 comorbidity: medical history  are also affecting patient's functional outcome.   REHAB POTENTIAL: Good  CLINICAL DECISION MAKING: Stable/uncomplicated  EVALUATION COMPLEXITY: Low   GOALS: Goals reviewed with patient? Yes  SHORT TERM GOALS: Target date: 07/06/22 - updated 07/12/22 - updated 08/18/22 -  updated 09/22/22 - updated 10/26/22 - updated 11/09/22 - updated 12/15/22 updated 01/18/23  Pt will be independent with HEP.   Baseline: Goal status: MET 07/12/22  2.  Pt will be independent with diaphragmatic breathing and down training activities in order to improve pelvic floor relaxation.  Baseline:  Goal status: MET 07/12/22  3.  Pt will be independent with the knack, urge suppression technique, and double voiding in order to improve bladder habits and decrease urinary incontinence.   Baseline:  Goal status: MET 09/22/22  4.  Pt will be independent with use of squatty potty, relaxed toileting mechanics, and improved bowel movement techniques in order to increase ease of bowel movements and complete evacuation.   Baseline:  Goal status: MET 11/09/22  5.  Pt will be able to correctly perform diaphragmatic breathing and appropriate pressure management in order to prevent worsening vaginal wall laxity and improve pelvic floor A/ROM.   Baseline:  Goal status: MET 01/18/23  LONG TERM GOALS: Target date: 11/16/2022 - updated 07/12/22 - updated 08/18/22 - updated 09/22/22 - updated 10/26/22 - updated 11/09/22 - updated 12/15/22 - updated 02/21/23  Pt will be independent with advanced HEP.   Baseline:  Goal status: IN PROGRESS 02/21/23  2.  Pt will demonstrate normal pelvic floor muscle tone and A/ROM, able to achieve 4/5 strength with contractions and 10 sec endurance, in order to provide appropriate lumbopelvic support in functional activities.   Baseline:  Goal status: IN PROGRESS 02/21/23  3.  Pt will  increase all impaired lumbar A/ROM by 25% without pain.  Baseline:  Goal status:  IN PROGRESS 02/21/23  4.  Pt will report pain no higher than 3/10 with any activity. Baseline:  Goal status:  IN PROGRESS 02/21/23  5.  Pt will report 0/10 pain with vaginal penetration in order to improve intimate relationship with partner.    Baseline:  Goal status:  IN PROGRESS 02/21/23  6.  Pt will be  able to go 2-3 hours in between voids without urgency or incontinence in order to improve QOL and perform all functional activities with less difficulty.   Baseline:  Goal status: MET 02/21/23  7.  Pt will report no leaks with laughing, coughing, sneezing in order to improve comfort with interpersonal relationships and community activities.   Baseline:  Goal status:  IN PROGRESS 02/21/23  8.  Pt will have consistent bowel movements 4-5x/week without straining or pain.  Baseline:  Goal status:  MET 02/21/23  PLAN:  PT FREQUENCY: 1-2x/week  PT DURATION: 6 months  PLANNED INTERVENTIONS: Therapeutic exercises, Therapeutic activity, Neuromuscular re-education, Balance training, Gait training, Patient/Family education, Self Care, Joint mobilization, Dry Needling, Biofeedback, and Manual therapy  PLAN FOR NEXT SESSION: Continue to work on motor control activities surrounding pelvis; progress mobility; manual techniques as needed for comfort and to increase circulation.   Julio Alm, PT, DPT02/25/259:34 AM

## 2023-02-28 NOTE — Therapy (Signed)
 OUTPATIENT OCCUPATIONAL THERAPY TREATMENT NOTE  L LOWER EXTREMITY/ B LOWER QUADRANT LYMPHEDEMA  Patient Name: Karen [name] MRN: [id number] DOB:09-25-1970, 53 y.o., female Today's Date: 02/28/2023  END OF SESSION:   OT End of Session - 02/28/23 0923     Visit Number 26    Number of Visits 36    Date for OT Re-Evaluation 05/17/23    OT Start Time 0802    OT Stop Time 0905    OT Time Calculation (min) 63 min    Activity Tolerance Patient tolerated treatment well;No increased pain    Behavior During Therapy [facility] for tasks assessed/performed               Past Medical History:  Diagnosis Date   Adenomyosis    Anemia    Arthritis 2013   L knee gets steroid injections    Asthmatic bronchitis 01/31/2017   Back pain    Chronic constipation    Diarrhea    DVT of lower extremity (deep venous thrombosis) (HCC)    Dysmenorrhea    Endometriosis    Esophageal stricture    GERD (gastroesophageal reflux disease)    History of kidney stones    History of uterine fibroid    IBS (irritable bowel syndrome)    Interstitial cystitis    Lactose intolerance    Migraine    with aura   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   PONV (postoperative nausea and vomiting)    Recurrent upper respiratory infection (URI)    Sebaceous cyst of breast, right lower inner quadrant 10/25/2012   Excised 11/21/12    Sleep apnea    Syncope, non cardiac    Past Surgical History:  Procedure Laterality Date   ARTHROSCOPIC HAGLUNDS REPAIR     BREAST CYST EXCISION Right 11/21/2012   Procedure: CYST EXCISION BREAST;  Surgeon: Currie Paris, MD;  Location: [facility];  Service: General;  Laterality: Right;   BREAST CYST EXCISION Bilateral 01/18/2022   Procedure: EXCISION OF SEBACEOUS CYST BILATERAL BREAST;  Surgeon: Harriette Bouillon, MD;  Location: [facility];  Service: General;  Laterality: Bilateral;   BREAST EXCISIONAL BIOPSY Right 11/2012   CESAREAN SECTION  04/19/1993    CHOLECYSTECTOMY     COLONOSCOPY  05/02/2011   Procedure: COLONOSCOPY;  Surgeon: Vertell Novak., MD;  Location: [facility];  Service: Endoscopy;  Laterality: N/A;   colonoscopy  11/04/2014   DENTAL SURGERY  03/06/2018   2 surgeries ( 03/06/2018 and 04/06/2018)   to remove two separate benign tumors   ESOPHAGEAL MANOMETRY N/A 11/04/2015   Procedure: ESOPHAGEAL MANOMETRY (EM);  Surgeon: Carman Ching, MD;  Location: [facility];  Service: Endoscopy;  Laterality: N/A;   ESOPHAGOGASTRODUODENOSCOPY  05/02/2011   Procedure: ESOPHAGOGASTRODUODENOSCOPY (EGD);  Surgeon: Vertell Novak., MD;  Location: [facility];  Service: Endoscopy;  Laterality: N/A;   ESOPHAGOGASTRODUODENOSCOPY N/A 09/01/2014   Procedure: ESOPHAGOGASTRODUODENOSCOPY (EGD);  Surgeon: Carman Ching, MD;  Location: [facility];  Service: Endoscopy;  Laterality: N/A;   KNEE SURGERY     LAPAROSCOPY     x 4   LESION REMOVAL N/A 01/18/2022   Procedure: EXCISION OF SEBACEOUS CYSTS CHEST AND NECK;  Surgeon: Harriette Bouillon, MD;  Location: [facility];  Service: General;  Laterality: N/A;   PH IMPEDANCE STUDY N/A 11/04/2015   Procedure: PH IMPEDANCE STUDY;  Surgeon: Carman Ching, MD;  Location: [facility];  Service: Endoscopy;  Laterality: N/A;   VAGINAL HYSTERECTOMY  2010   TVH--ovaries  remain   Patient Active Problem List   Diagnosis Date Noted   Sjogren syndrome with keratoconjunctivitis (HCC) 12/09/2022   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   Elevated LFTs 04/04/2021   Colitis 04/04/2021   Bacterial overgrowth syndrome 05/21/2020   Obesity 05/21/2020   History of endometriosis 05/21/2020   Constipation due to outlet dysfunction 02/05/2020   Gastroesophageal reflux disease 02/05/2020   Obstructive sleep apnea treated with continuous positive airway pressure (CPAP) 06/16/2017   Perennial allergic rhinitis with a nonallergic component 01/31/2017   Asthmatic bronchitis 01/31/2017   GI symptoms 01/31/2017    Family history of colon cancer 01/27/2015   Chest pain 12/13/2012   Dyspnea 12/13/2012   DVT of lower extremity (deep venous thrombosis) (HCC) 01/03/1990    PCP: Coral Else, MD  REFERRING PROVIDER: Lorenda Ishihara, MD  REFERRING DIAG: I89.0  THERAPY DIAG:  Lymphedema, not elsewhere classified  Rationale for Evaluation and Treatment: Rehabilitation  ONSET DATE: ~2014; exacerbation in 2023  SUBJECTIVE:                                                                                                                                                                                           SUBJECTIVE STATEMENT: Pt presents for OT to address lower quadrant and LLE lymphedema. Pt does not rate LE-related pain numerically this morning. Pt states she continues walking for exercise on a regular basis. Pt c/o pain in R flank region without known precipitating event. Pt reports, "This is really helping. I haven't had to use Miralax in over 2 months!"   PERTINENT HISTORY: CVI, OSA (no CPAP), GERD, Esophageal stricture, IBS, Lactose intolerant, Diarrhea, Hx LE DVT  PAIN:  Are you having pain? Yes: NPRS scale: see SUBJECTIVE/10 Pain location: BLE, digestive discomfort, R flank pain Pain description: myalgia, tight, heavy, tingling, numbness Aggravating factors: standing, walking, dependent sitting > 30 minutes Relieving factors: elevation  PRECAUTIONS: Other: LYMPHEDEMA; Hx LLE DVT  RED FLAGS: Hx LLE DVT  WEIGHT BEARING RESTRICTIONS: No  FALLS:  Has patient fallen in last 6 months? No  LIVING ENVIRONMENT: Lives with: lives with spouse and daughter Lives in: House/apartment Stairs: Yes; Internal: 14 steps; yes and External: 4 steps; yes Has following equipment at home: None  OCCUPATION: Diplomatic Services operational officer, walking, desk work  LEISURE: family time  HAND DOMINANCE: right   PRIOR LEVEL OF FUNCTION: Independent  PATIENT GOALS: Feel better, be able to be more active  without pain, wear preferred street shoes  OBJECTIVE: Note: Objective measures were completed at Evaluation unless otherwise noted.  COGNITION:  Overall cognitive status: Within functional limits for tasks assessed   OBSERVATIONS / OTHER ASSESSMENTS:  POSTURE: WFL  LE ROM: WFL  LE MMT: WFL  LYMPHEDEMA ASSESSMENTS: non-Ca related  INFECTIONS: denies cellulitis and wound hx  BLE COMPARATIVE LIMB VOLUMETRICS Initial 11/15/22  LANDMARK RIGHT (dominant)  R LEG (A-D) 3028.9 ml  R THIGH (E-G) 4809.60 ml  R FULL LIMB (A-G) 7838.5 ml  Limb Volume differential (LVD)  %  Volume change since initial %  Volume change overall V  (Blank rows = not tested)  LANDMARK LEFT  (Rx)  L LEG (A-D) 3189.9 ml  L THIGH (E-G) 4959.8 ml  L FULL LIMB (A-G) 8149.7 ml  Limb Volume differential (LVD)  LEG LVD = 5.32%, L>R; THIGH LVD = 3.12%, L>R; And full LLE LVD = 3.97%, L>R. 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 Obesity  Skin  Description Hyper-Keratosis Peau d' Orange Shiny Tight Fibrotic/ Indurated Fatty Doughy Spongy/ boggy       R>L x  x   Skin dry Flaky WNL Macerated   mildly      Color Redness Varicosities Blanching Hemosiderin Stain Mottled        x   Odor Malodorous Yeast Fungal infection  WNL      x   Temperature Warm Cool wnl     x    Pitting Edema   1+ 2+ 3+ 4+ Non-pitting         x   Girth Symmetrical Asymmetrical                   Distribution    L>R toes to groin    Stemmer Sign Positive Negative   +L -R   Lymphorrhea History Of:  Present Absent     x    Wounds History Of Present Absent Venous Arterial Pressure Sheer     x        Signs of Infection Redness Warmth Erythema Acute Swelling Drainage Borders                    Sensation Light Touch Deep pressure Hypersensitivity   In tact Impaired In tact Impaired Absent Impaired   x   x  x     Nails WNL   Fungus nail dystrophy   x     Hair Growth Symmetrical Asymmetrical   x    Skin Creases Base of toes  Ankles   Base of Fingers knees       Abdominal pannus Thigh Lobules  Face/neck   x           GAIT: Distance walked: >500' Assistive device utilized: None Level of assistance: Complete Independence Comments: Functional ambulation and transfers [facility]  LYMPHEDEMA LIFE IMPACT SCALE (LLIS): Initial 11/15/22  50%  FOTO functional outcome measure: Deferred. FOTO discontinued.  TODAY'S TREATMENT:  MLD to BLQ emphasis on colon and LLE  PATIENT EDUCATION:  Continued Pt/ CG edu for how function of cisterna chyli, part of the thoracic duct of deep abdominal lymphatics, assists with digestion by filtering many of the fats and proteins from the digestive system. Sub optimal lymphatic flow in this region may result in constipation, bloating, swelling, etc. MLD helps mobilize these protein and fats , and the rhythmic movement of manual lymphatic drainage stimulates digestive muscles and increase peristalsis. Provided printed resource for reference.  Topics include outcome of comparative limb volumetrics- starting limb volume differentials (LVDs), technology and gradient techniques used for short stretch, multilayer compression wrapping, simple self-MLD, therapeutic lymphatic pumping exercises, skin/nail care, LE precautions,. compression garment recommendations and specifications, wear and care schedule and compression garment donning / doffing w assistive devices. Discussed progress towards all OT goals since commencing CDT. All questions answered to the Pt's satisfaction. Good return. Person educated: Patient  Education method: Explanation, Demonstration, and Handouts Education comprehension: verbalized understanding, returned demonstration,  verbal cues required, and needs further education  HOME EXERCISE PROGRAM: BLE lymphatic pumping there ex using- 1 sets of 10 reps, each exercise in order-  1-2 x daily, bilaterally Simple self MLD 1 x daily Daily skin care to increase hydration, skin mobility and decrease infection risk- can be done during MLD Compression wraps 23/7 during intensive phase of CDT Compression garments/ devices during self-management phase of CDT  ASSESSMENT:  CLINICAL IMPRESSION: Moist heat applied during session assisted with relaxation and pain reduction throughout session.  Pt tolerated abdominal MLD and MLD to LLE/LLQ without increased pain.  Pt has been lass reliant on MiraLAX  to at w relief from constipation since commencing MLD by report. Pt  continues to benefit from skilled OT for Lymphedema care to reduce pain and digestive discomfort. Cont as per POC.  11/15/22 Initial Evaluation: Tekesha Almgren is a 53 y.o. female presenting with very mild, stage II, LLE lymphedema 2/2 venous insufficiency and obesity. L Leg swelling and associated pain swelling fluctuates. It has progressed over time, and now no longer resolves with elevation over night. RLE swelling limits balance during functional ambulation. It exacerbates infection and falls risk. LLE/RLQ lymphedema interferes with functional performance in all occupational domains, including basic and instrumental ADLs, productive activities, leisure pursuits and social participation. Pt will benefit from Occupational Therapy for Complete Decongestive Therapy (CDT) to restore function, to reduce physical and psychologic suffering associated with chronic, progressive lymphedema and associated pain, and to limit infection. CDT will include manual lymphatic drainage (MLD), skin care, therapeutic exercise and compression wraps and garment's devices. Without skilled OT for lymphedema care the condition will worsen and further functional decline is expected  Custom-made  gradient compression garments and HOS devices are medically necessary because they are uniquely sized and shaped to fit the exact dimensions of the affected extremities, and to provide appropriate medical grade, graduated compression essential for optimally managing chronic, progressive lymphedema. Multiple custom compression garments are needed to ensure proper hygiene to limit infection risk. Custom compression garments should be replaced q 3-6 months When worn consistently for optimal lipo-lymphedema self-management over time. HOS devices, medically necessary to limit fibrosis buildup in tissue, should be replaced q 2 years and PRN when worn out.      OBJECTIVE IMPAIRMENTS: decreased activity tolerance, decreased knowledge of condition, decreased knowledge of use of DME, increased edema, impaired sensation, pain, and chronic, progressive leg swelling .   ACTIVITY LIMITATIONS: dependent sitting, extended standing and/ or walking, squatting,  and lower body dressing, fitting preferred street shoes  PARTICIPATION LIMITATIONS: shopping, community activity, occupation, and yard work  PERSONAL FACTORS: 3+ comorbidities: Hx DVT,  OSA, CVI. Varicose veins  are also affecting patient's functional outcome.   REHAB POTENTIAL: Good  EVALUATION COMPLEXITY: Moderate   GOALS: Goals reviewed with patient? Yes  SHORT TERM GOALS: Target date: 4th OT Rx visit  SHORT TERM GOALS: Target date: 4th OT Rx visit   Pt will demonstrate understanding of lymphedema precautions and prevention strategies with modified independence using a printed reference to identify at least 5 precautions and discussing how s/he may implement them into daily life to reduce risk of progression with extra time. Baseline:Max A Goal status: GOAL MET   Pt will be able to apply multilayer, knee length, gradient, compression wraps to one leg at a time with modified assistance (extra time and assistive device/s) to decrease limb volume, to  limit infection risk, and to limit lymphedema progression.  Baseline: Dependent Goal status: GOAL DISCHARGED. Pt Going to compression garments instead  LONG TERM GOALS: Target date: 02/08/23  Given this patient's Intake score of 67%/100% on the functional outcomes FOTO tool, patient will experience an increase in function of 5 points to improve basic and instrumental ADLs performance, including lymphedema self-care.  Baseline: Max A Goal status:GOAL DISCHARGED.  FOTO TOOL DISCONTINUED AT CLINIC   Given this patient's Intake score of 50% on the Lymphedema Life Impact Scale (LLIS), patient will experience a reduction of at least 5 points in her perceived level of functional impairment resulting from lymphedema to improve functional performance and quality of life (QOL). Baseline: % Goal status: PROGRESSING  Pt will achieve at least a 10% volume reduction in B legs to return limb to typical size and shape, to limit infection risk and LE progression, to decrease pain, to improve function. Baseline: Dependent Goal status: PROGRESSING  4.  Pt will obtain proper compression garments/devices and achieve modified independence (extra time + assistive devices) with donning/doffing to optimize limb volume reductions and limit LE  progression over time. Baseline:  Goal status: ONGOING. Garments  5.  During Intensive phase CDT , with modified independence, Pt will achieve at least 85% compliance with all lymphedema self-care home program components, including daily skin care, compression wraps and /or garments, simple self MLD and lymphatic pumping therex to habituate LE self care protocol  into ADLs for optimal LE self-management over time. Baseline: Dependent Goal status: PROGRESSING   PLAN:  OT FREQUENCY: 2x/week  OT DURATION: 12 weeks  PLANNED INTERVENTIONS: 97110-Therapeutic exercises, 97530- Therapeutic activity, 97535- Self Care, 19147- Manual therapy, Patient/Family education, Manual lymph  drainage, Compression bandaging, and fit with appropriate compression garments  PLAN FOR NEXT SESSION:  Cont MLD Cont Pt edu for LE self-care  Loel Dubonnet, MS, OTR/L, CLT-LANA 02/28/23 9:37 AM '

## 2023-03-01 ENCOUNTER — Encounter: Payer: Self-pay | Admitting: Dermatology

## 2023-03-01 ENCOUNTER — Ambulatory Visit: Payer: Commercial Managed Care - PPO | Admitting: Occupational Therapy

## 2023-03-01 ENCOUNTER — Ambulatory Visit (INDEPENDENT_AMBULATORY_CARE_PROVIDER_SITE_OTHER): Payer: Commercial Managed Care - PPO | Admitting: Podiatry

## 2023-03-01 ENCOUNTER — Encounter: Payer: Self-pay | Admitting: Podiatry

## 2023-03-01 ENCOUNTER — Ambulatory Visit: Payer: Commercial Managed Care - PPO | Admitting: Dermatology

## 2023-03-01 VITALS — BP 102/64 | HR 90

## 2023-03-01 DIAGNOSIS — L280 Lichen simplex chronicus: Secondary | ICD-10-CM | POA: Diagnosis not present

## 2023-03-01 DIAGNOSIS — D485 Neoplasm of uncertain behavior of skin: Secondary | ICD-10-CM

## 2023-03-01 DIAGNOSIS — D239 Other benign neoplasm of skin, unspecified: Secondary | ICD-10-CM

## 2023-03-01 DIAGNOSIS — D1801 Hemangioma of skin and subcutaneous tissue: Secondary | ICD-10-CM

## 2023-03-01 DIAGNOSIS — L03031 Cellulitis of right toe: Secondary | ICD-10-CM | POA: Diagnosis not present

## 2023-03-01 DIAGNOSIS — D492 Neoplasm of unspecified behavior of bone, soft tissue, and skin: Secondary | ICD-10-CM

## 2023-03-01 DIAGNOSIS — L814 Other melanin hyperpigmentation: Secondary | ICD-10-CM | POA: Diagnosis not present

## 2023-03-01 DIAGNOSIS — D225 Melanocytic nevi of trunk: Secondary | ICD-10-CM

## 2023-03-01 DIAGNOSIS — L821 Other seborrheic keratosis: Secondary | ICD-10-CM

## 2023-03-01 DIAGNOSIS — L905 Scar conditions and fibrosis of skin: Secondary | ICD-10-CM | POA: Diagnosis not present

## 2023-03-01 DIAGNOSIS — D229 Melanocytic nevi, unspecified: Secondary | ICD-10-CM

## 2023-03-01 DIAGNOSIS — Z1283 Encounter for screening for malignant neoplasm of skin: Secondary | ICD-10-CM | POA: Diagnosis not present

## 2023-03-01 DIAGNOSIS — I890 Lymphedema, not elsewhere classified: Secondary | ICD-10-CM

## 2023-03-01 NOTE — Progress Notes (Signed)
 Total Body Skin Exam (TBSE) Visit   Subjective  Karen [name] is a 53 y.o. female who presents for the following: Skin Cancer Screening and Full Body Skin Exam  Patient presents today for follow up visit for TBSE. Patient was last evaluated on 01/30/23. She was evaluated for [facility]. She was prescribed Nystatin-TMC to apply 2 times daily 2 weeks on and 2 weeks off. She states her last usage was about 2 weeks ago and she has noticed the areas have returned and are bleeding. Patient denies medication changes. Patient reports she does have spots, moles and lesions of concern to be evaluated. Patient reports throughout her lifetime she has had minimal sun exposure. Currently, patient reports if she has excessive sun exposure, she does not apply sunscreen and/or wears protective coverings. Patient reports she has hx of bx. Patient denies  family history of skin cancers. The patient has spots, moles and lesions to be evaluated, some may be new or changing and the patient has concerns that these could be cancer.  The following portions of the chart were reviewed this encounter and updated as appropriate: medications, allergies, medical history  Review of Systems:  No other skin or systemic complaints except as noted in HPI or Assessment and Plan.  Objective  Well appearing patient in no apparent distress; mood and affect are within normal limits.  A full examination was performed including scalp, head, eyes, ears, nose, lips, neck, chest, axillae, abdomen, back, buttocks, bilateral upper extremities, bilateral lower extremities, hands, feet, fingers, toes, fingernails, and toenails. All findings within normal limits unless otherwise noted below.   Relevant physical exam findings are noted in the Assessment and Plan.         Right Lower Back 6.5 mm irregular black macule Left Lower Back 4 mm irregular Black Macule  Assessment & Plan   LENTIGINES, SEBORRHEIC KERATOSES, HEMANGIOMAS - Benign  normal skin lesions - Benign-appearing - Call for any changes  MELANOCYTIC NEVI - Tan-brown and/or pink-flesh-colored symmetric macules and papules - Benign appearing on exam today - Observation - Call clinic for new or changing moles - Recommend daily use of broad spectrum spf 30+ sunscreen to sun-exposed areas.  .  Dermatosis Papulosa Nigra  Skin Care: Dermatosis Papulosa Gilford Silvius can resolve with light electrodesiccation. Expectations: Dermatosis Papulosa Gilford Silvius are benign growths. No treatment is necessary. Plan: Cosmetic Quote The patient received the following cosmetic quote  Other Procedure(Skin Tags): Quote for cosmetic removal:  $200 to remove on 1 area  $300 to remove on 2 areas  $400 to removal on 3 areas   DERMATOFIBROMA Exam: Firm pink/brown papulenodule with dimple sign.  Treatment Plan: A dermatofibroma is a benign growth possibly related to trauma, such as an insect bite, cut from shaving, or inflamed acne-type bump.  Treatment options to remove include shave or excision with resulting scar and risk of recurrence.  Since benign-appearing and not bothersome, will observe for now.   LICHEN SIMPLEX CHRONICUS/PRURIGO NODULARIS Exam: Excoriated lichenified papules and/or plaques at  perianal regions  Improved bu not at goal  Lichen simplex chronicus [facility]) is a persistent itchy area of thickened skin that is induced by chronic rubbing and/or scratching (chronic dermatitis).  These areas may be pink, hyperpigmented and may have excoriations and bumps (prurigo nodules- PN).  LSC/PN is commonly observed in uncontrolled atopic dermatitis and other forms of eczema, and in other itchy skin conditions (eg, insect bites, scabies).  Sometimes it is not possible to know initial cause of LSC/PN  if it has been present for a long time.  It generally responds well to treatment with high potency topical steroids.  It is important to stop rubbing/scratching the area in order to break the  itch-scratch-rash-itch cycle, in order for the rash to resolve.   Treatment Plan: - Advised to continue Nystatin- TMC for 2 weeks and alternate with Zinc Oxide mixed with Aquaphor - Avoid picking/rubbing/scratching   Well Healed scar from previous Nevi Excision with some re-pigmentation  Treatment Plan: - Benign in appearance, recommended to monitor  SKIN CANCER SCREENING PERFORMED TODAY.  NEOPLASM OF UNCERTAIN BEHAVIOR OF SKIN (2) Right Lower Back Epidermal / dermal shaving  Lesion diameter (cm):  0.7 Informed consent: discussed and consent obtained   Timeout: patient name, date of birth, surgical site, and procedure verified   Procedure prep:  Patient was prepped and draped in usual sterile fashion Prep type:  Isopropyl alcohol Anesthesia: the lesion was anesthetized in a standard fashion   Anesthetic:  1% lidocaine w/ epinephrine 1-100,000 buffered w/ 8.4% NaHCO3 Instrument used: DermaBlade   Hemostasis achieved with: aluminum chloride   Outcome: patient tolerated procedure well   Post-procedure details: sterile dressing applied and wound care instructions given   Dressing type: petrolatum   Additional details:  Pt aware that benign results will be sent to mychart and the staff will call abnormal results will Specimen A - Surgical pathology Differential Diagnosis: DN  Check Margins: No Left Lower Back Epidermal / dermal shaving  Lesion diameter (cm):  0.4 Informed consent: discussed and consent obtained   Timeout: patient name, date of birth, surgical site, and procedure verified   Procedure prep:  Patient was prepped and draped in usual sterile fashion Prep type:  Isopropyl alcohol Anesthesia: the lesion was anesthetized in a standard fashion   Anesthetic:  1% lidocaine w/ epinephrine 1-100,000 buffered w/ 8.4% NaHCO3 Instrument used: DermaBlade   Hemostasis achieved with: aluminum chloride   Outcome: patient tolerated procedure well   Post-procedure details:  sterile dressing applied and wound care instructions given   Dressing type: petrolatum   Additional details:  Pt aware that benign results will be sent to mychart and the staff will call abnormal results will Specimen B - Surgical pathology Differential Diagnosis: DN  Check Margins: No  Return in about 4 months (around 06/29/2023) for [facility] F/U.  Documentation: I have reviewed the above documentation for accuracy and completeness, and I agree with the above.  Stasia Cavalier, am acting as scribe for Langston Reusing, DO.  Langston Reusing, DO

## 2023-03-01 NOTE — Patient Instructions (Addendum)
 Hello Karen [name],  Thank you for visiting [place] today.   Below is a summary of the essential instructions and treatment plan from our consultation:  Medications and Treatment:   Nystatin-Triamcinolone: Apply for two weeks, followed by Desitin or Aquaphor for two weeks.     Alternating Treatment: Switch between Nystatin and Desitin or Aquaphor, two weeks on and two weeks off, for three months. Follow this with a month of no treatment.  Skin Monitoring and Procedures:   Mole Removal: Two new moles on your lower back have been removed for lab analysis.   Annual Skin Checks: Recommended due to your history of moles.   Lab Results: Will be communicated through MyChart or a phone call for necessary follow-ups.  Additional Recommendations:  =   GI Consultations: Continue as scheduled and discuss using MiraLax for constipation.  Follow-Up:   Next Appointment: Scheduled for four months from now to reassess your condition and adjust the treatment plan as needed.  We are committed to supporting you in your journey towards better skin health. Should you have any questions or concerns before our next meeting, please feel free to reach out to our office.  Warm regards,  Dr. Langston Reusing, Dermatology  Patient Handout: Wound Care for Skin Biopsy Site  Taking Care of Your Skin Biopsy Site  Proper care of the biopsy site is essential for promoting healing and minimizing scarring. This handout provides instructions on how to care for your biopsy site to ensure optimal recovery.  1. Cleaning the Wound:  Clean the biopsy site daily with gentle soap and water. Gently pat the area dry with a clean, soft towel. Avoid harsh scrubbing or rubbing the area, as this can irritate the skin and delay healing.  2. Applying Aquaphor and Bandage:  After cleaning the wound, apply a thin layer of Aquaphor ointment to the biopsy site. Cover the area with a sterile bandage to protect it from dirt, bacteria, and  friction. Change the bandage daily or as needed if it becomes soiled or wet.  3. Continued Care for One Week:  Repeat the cleaning, Aquaphor application, and bandaging process daily for one week following the biopsy procedure. Keeping the wound clean and moist during this initial healing period will help prevent infection and promote optimal healing.  4. Massaging Aquaphor into the Area:  ---After one week, discontinue the use of bandages but continue to apply Aquaphor to the biopsy site. ----Gently massage the Aquaphor into the area using circular motions. ---Massaging the skin helps to promote circulation and prevent the formation of scar tissue.   Additional Tips:  Avoid exposing the biopsy site to direct sunlight during the healing process, as this can cause hyperpigmentation or worsen scarring. If you experience any signs of infection, such as increased redness, swelling, warmth, or drainage from the wound, contact your healthcare provider immediately. Follow any additional instructions provided by your healthcare provider for caring for the biopsy site and managing any discomfort. Conclusion:  Taking proper care of your skin biopsy site is crucial for ensuring optimal healing and minimizing scarring. By following these instructions for cleaning, applying Aquaphor, and massaging the area, you can promote a smooth and successful recovery. If you have any questions or concerns about caring for your biopsy site, don't hesitate to contact your healthcare provider for guidance.    Important Information   Due to recent changes in healthcare laws, you may see results of your pathology and/or laboratory studies on MyChart before the doctors have had  a chance to review them. We understand that in some cases there may be results that are confusing or concerning to you. Please understand that not all results are received at the same time and often the doctors may need to interpret multiple  results in order to provide you with the best plan of care or course of treatment. Therefore, we ask that you please give [place] 2 business days to thoroughly review all your results before contacting the office for clarification. Should we see a critical lab result, you will be contacted sooner.     If You Need Anything After Your Visit   If you have any questions or concerns for your doctor, please call our main line at [phone]. If no one answers, please leave a voicemail as directed and we will return your call as soon as possible. Messages left after 4 pm will be answered the following business day.    You may also send [place] a message via MyChart. We typically respond to MyChart messages within 1-2 business days.  For prescription refills, please ask your pharmacy to contact our office. Our fax number is [phone].  If you have an urgent issue when the clinic is closed that cannot wait until the next business day, you can page your doctor at the number below.     Please note that while we do our best to be available for urgent issues outside of office hours, we are not available 24/7.    If you have an urgent issue and are unable to reach [place], you may choose to seek medical care at your doctor's office, retail clinic, urgent care center, or emergency room.   If you have a medical emergency, please immediately call 911 or go to the emergency department. In the event of inclement weather, please call our main line at [phone] for an update on the status of any delays or closures.  Dermatology Medication Tips: Please keep the boxes that topical medications come in in order to help keep track of the instructions about where and how to use these. Pharmacies typically print the medication instructions only on the boxes and not directly on the medication tubes.   If your medication is too expensive, please contact our office at [phone] or send [place] a message through MyChart.    We are  unable to tell what your co-pay for medications will be in advance as this is different depending on your insurance coverage. However, we may be able to find a substitute medication at lower cost or fill out paperwork to get insurance to cover a needed medication.    If a prior authorization is required to get your medication covered by your insurance company, please allow [place] 1-2 business days to complete this process.   Drug prices often vary depending on where the prescription is filled and some pharmacies may offer cheaper prices.   The website www.goodrx.com contains coupons for medications through different pharmacies. The prices here do not account for what the cost may be with help from insurance (it may be cheaper with your insurance), but the website can give you the price if you did not use any insurance.  - You can print the associated coupon and take it with your prescription to the pharmacy.  - You may also stop by our office during regular business hours and pick up a GoodRx coupon card.  - If you need your prescription sent electronically to a different pharmacy, notify our office through [facility] MyChart or by phone at [phone]

## 2023-03-01 NOTE — Therapy (Signed)
 OUTPATIENT OCCUPATIONAL THERAPY TREATMENT NOTE  L LOWER EXTREMITY/ B LOWER QUADRANT LYMPHEDEMA  Patient Name: Karen [name] MRN: [id number] DOB:May 10, 1970, 53 y.o., female Today's Date: 03/01/2023  END OF SESSION:   OT End of Session - 03/01/23 1604     Visit Number 27    Number of Visits 36    Date for OT Re-Evaluation 05/17/23    OT Start Time 1108    OT Stop Time 1202    OT Time Calculation (min) 54 min    Activity Tolerance Patient tolerated treatment well;No increased pain    Behavior During Therapy [facility] for tasks assessed/performed               Past Medical History:  Diagnosis Date   Adenomyosis    Anemia    Arthritis 2013   L knee gets steroid injections    Asthmatic bronchitis 01/31/2017   Back pain    Chronic constipation    Diarrhea    DVT of lower extremity (deep venous thrombosis) (HCC)    Dysmenorrhea    Endometriosis    Esophageal stricture    GERD (gastroesophageal reflux disease)    History of kidney stones    History of uterine fibroid    IBS (irritable bowel syndrome)    Interstitial cystitis    Lactose intolerance    Migraine    with aura   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   PONV (postoperative nausea and vomiting)    Recurrent upper respiratory infection (URI)    Sebaceous cyst of breast, right lower inner quadrant 10/25/2012   Excised 11/21/12    Sleep apnea    Syncope, non cardiac    Past Surgical History:  Procedure Laterality Date   ARTHROSCOPIC HAGLUNDS REPAIR     BREAST CYST EXCISION Right 11/21/2012   Procedure: CYST EXCISION BREAST;  Surgeon: Currie Paris, MD;  Location: [facility];  Service: General;  Laterality: Right;   BREAST CYST EXCISION Bilateral 01/18/2022   Procedure: EXCISION OF SEBACEOUS CYST BILATERAL BREAST;  Surgeon: Harriette Bouillon, MD;  Location: [facility];  Service: General;  Laterality: Bilateral;   BREAST EXCISIONAL BIOPSY Right 11/2012   CESAREAN SECTION  04/19/1993    CHOLECYSTECTOMY     COLONOSCOPY  05/02/2011   Procedure: COLONOSCOPY;  Surgeon: Vertell Novak., MD;  Location: [facility];  Service: Endoscopy;  Laterality: N/A;   colonoscopy  11/04/2014   DENTAL SURGERY  03/06/2018   2 surgeries ( 03/06/2018 and 04/06/2018)   to remove two separate benign tumors   ESOPHAGEAL MANOMETRY N/A 11/04/2015   Procedure: ESOPHAGEAL MANOMETRY (EM);  Surgeon: Carman Ching, MD;  Location: [facility];  Service: Endoscopy;  Laterality: N/A;   ESOPHAGOGASTRODUODENOSCOPY  05/02/2011   Procedure: ESOPHAGOGASTRODUODENOSCOPY (EGD);  Surgeon: Vertell Novak., MD;  Location: [facility];  Service: Endoscopy;  Laterality: N/A;   ESOPHAGOGASTRODUODENOSCOPY N/A 09/01/2014   Procedure: ESOPHAGOGASTRODUODENOSCOPY (EGD);  Surgeon: Carman Ching, MD;  Location: [facility];  Service: Endoscopy;  Laterality: N/A;   KNEE SURGERY     LAPAROSCOPY     x 4   LESION REMOVAL N/A 01/18/2022   Procedure: EXCISION OF SEBACEOUS CYSTS CHEST AND NECK;  Surgeon: Harriette Bouillon, MD;  Location: [facility];  Service: General;  Laterality: N/A;   PH IMPEDANCE STUDY N/A 11/04/2015   Procedure: PH IMPEDANCE STUDY;  Surgeon: Carman Ching, MD;  Location: [facility];  Service: Endoscopy;  Laterality: N/A;   VAGINAL HYSTERECTOMY  2010   TVH--ovaries  remain   Patient Active Problem List   Diagnosis Date Noted   Sjogren syndrome with keratoconjunctivitis (HCC) 12/09/2022   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   Elevated LFTs 04/04/2021   Colitis 04/04/2021   Bacterial overgrowth syndrome 05/21/2020   Obesity 05/21/2020   History of endometriosis 05/21/2020   Constipation due to outlet dysfunction 02/05/2020   Gastroesophageal reflux disease 02/05/2020   Obstructive sleep apnea treated with continuous positive airway pressure (CPAP) 06/16/2017   Perennial allergic rhinitis with a nonallergic component 01/31/2017   Asthmatic bronchitis 01/31/2017   GI symptoms 01/31/2017    Family history of colon cancer 01/27/2015   Chest pain 12/13/2012   Dyspnea 12/13/2012   DVT of lower extremity (deep venous thrombosis) (HCC) 01/03/1990    PCP: Coral Else, MD  REFERRING PROVIDER: Lorenda Ishihara, MD  REFERRING DIAG: I89.0  THERAPY DIAG:  Lymphedema, not elsewhere classified  Rationale for Evaluation and Treatment: Rehabilitation  ONSET DATE: ~2014; exacerbation in 2023  SUBJECTIVE:                                                                                                                                                                                           SUBJECTIVE STATEMENT: Pt presents for OT to address lower quadrant and LLE lymphedema. Pt does not rate LE-related pain numerically this morning. Pt states she continues walking for exercise on a regular basis.   PERTINENT HISTORY: CVI, OSA (no CPAP), GERD, Esophageal stricture, IBS, Lactose intolerant, Diarrhea, Hx LE DVT  PAIN:  Are you having pain? Yes: NPRS scale: see SUBJECTIVE/10 Pain location: BLE, digestive discomfort, R flank pain Pain description: myalgia, tight, heavy, tingling, numbness Aggravating factors: standing, walking, dependent sitting > 30 minutes Relieving factors: elevation  PRECAUTIONS: Other: LYMPHEDEMA; Hx LLE DVT  RED FLAGS: Hx LLE DVT  WEIGHT BEARING RESTRICTIONS: No  FALLS:  Has patient fallen in last 6 months? No  LIVING ENVIRONMENT: Lives with: lives with spouse and daughter Lives in: House/apartment Stairs: Yes; Internal: 14 steps; yes and External: 4 steps; yes Has following equipment at home: None  OCCUPATION: Dipl[employer] Services operational officer, walking, desk work  LEISURE: family time  HAND DOMINANCE: right   PRIOR LEVEL OF FUNCTION: Independent  PATIENT GOALS: Feel better, be able to be more active without pain, wear preferred street shoes  OBJECTIVE: Note: Objective measures were completed at Evaluation unless otherwise  noted.  COGNITION:  Overall cognitive status: Within functional limits for tasks assessed   OBSERVATIONS / OTHER ASSESSMENTS:   POSTURE: WFL  LE ROM: WFL  LE MMT: WFL  LYMPHEDEMA ASSESSMENTS: non-Ca related  INFECTIONS: denies cellulitis and wound hx  BLE COMPARATIVE  LIMB VOLUMETRICS Initial 11/15/22  LANDMARK RIGHT (dominant)  R LEG (A-D) 3028.9 ml  R THIGH (E-G) 4809.60 ml  R FULL LIMB (A-G) 7838.5 ml  Limb Volume differential (LVD)  %  Volume change since initial %  Volume change overall V  (Blank rows = not tested)  LANDMARK LEFT  (Rx)  L LEG (A-D) 3189.9 ml  L THIGH (E-G) 4959.8 ml  L FULL LIMB (A-G) 8149.7 ml  Limb Volume differential (LVD)  LEG LVD = 5.32%, L>R; THIGH LVD = 3.12%, L>R; And full LLE LVD = 3.97%, L>R. 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 Obesity  Skin  Description Hyper-Keratosis Peau d' Orange Shiny Tight Fibrotic/ Indurated Fatty Doughy Spongy/ boggy       R>L x  x   Skin dry Flaky WNL Macerated   mildly      Color Redness Varicosities Blanching Hemosiderin Stain Mottled        x   Odor Malodorous Yeast Fungal infection  WNL      x   Temperature Warm Cool wnl     x    Pitting Edema   1+ 2+ 3+ 4+ Non-pitting         x   Girth Symmetrical Asymmetrical                   Distribution    L>R toes to groin    Stemmer Sign Positive Negative   +L -R   Lymphorrhea History Of:  Present Absent     x    Wounds History Of Present Absent Venous Arterial Pressure Sheer     x        Signs of Infection Redness Warmth Erythema Acute Swelling Drainage Borders                    Sensation Light Touch Deep pressure Hypersensitivity   In tact Impaired In tact Impaired Absent Impaired   x  x  x     Nails WNL   Fungus nail dystrophy   x     Hair Growth Symmetrical Asymmetrical   x    Skin Creases Base of  toes  Ankles   Base of Fingers knees       Abdominal pannus Thigh Lobules  Face/neck   x           GAIT: Distance walked: >500' Assistive device utilized: None Level of assistance: Complete Independence Comments: Functional ambulation and transfers [facility]  LYMPHEDEMA LIFE IMPACT SCALE (LLIS): Initial 11/15/22  50%  FOTO functional outcome measure: Deferred. FOTO discontinued.  TODAY'S TREATMENT:  MLD to BLQ emphasis on colon and LLE Pt edu for le SELF CARE  PATIENT EDUCATION:  Continued Pt/ CG edu for how function of cisterna chyli, part of the thoracic duct of deep abdominal lymphatics, assists with digestion by filtering many of the fats and proteins from the digestive system. Sub optimal lymphatic flow in this region may result in constipation, bloating, swelling, etc. MLD helps mobilize these protein and fats , and the rhythmic movement of manual lymphatic drainage stimulates digestive muscles and increase peristalsis. Provided printed resource for reference.  Topics include outcome of comparative limb volumetrics- starting limb volume differentials (LVDs), technology and gradient techniques used for short stretch, multilayer compression wrapping, simple self-MLD, therapeutic lymphatic pumping exercises, skin/nail care, LE precautions,. compression garment recommendations and specifications, wear and care schedule and compression garment donning / doffing w assistive devices. Discussed progress towards all OT goals since commencing CDT. All questions answered to the Pt's satisfaction. Good return. Person educated: Patient  Education method: Explanation, Demonstration, and Handouts Education comprehension: verbalized understanding, returned demonstration, verbal cues required, and needs further education  HOME EXERCISE PROGRAM: BLE lymphatic pumping  there ex using- 1 sets of 10 reps, each exercise in order-  1-2 x daily, bilaterally Simple self MLD 1 x daily Daily skin care to increase hydration, skin mobility and decrease infection risk- can be done during MLD Compression wraps 23/7 during intensive phase of CDT Compression garments/ devices during self-management phase of CDT  ASSESSMENT:  CLINICAL IMPRESSION:  Pt tolerated abdominal MLD and MLD to LLE/LLQ without increased pain.  Utilizing deep abdominal lymphatic pathways, including alternate pressure and release on descending, then transverse, then ascending colon and diaphragmatic breathing. Pt  continues to benefit from skilled OT for Lymphedema care to reduce pain and digestive discomfort. Cont as per POC.  11/15/22 Initial Evaluation: Buena Boehm is a 53 y.o. female presenting with very mild, stage II, LLE lymphedema 2/2 venous insufficiency and obesity. L Leg swelling and associated pain swelling fluctuates. It has progressed over time, and now no longer resolves with elevation over night. RLE swelling limits balance during functional ambulation. It exacerbates infection and falls risk. LLE/RLQ lymphedema interferes with functional performance in all occupational domains, including basic and instrumental ADLs, productive activities, leisure pursuits and social participation. Pt will benefit from Occupational Therapy for Complete Decongestive Therapy (CDT) to restore function, to reduce physical and psychologic suffering associated with chronic, progressive lymphedema and associated pain, and to limit infection. CDT will include manual lymphatic drainage (MLD), skin care, therapeutic exercise and compression wraps and garment's devices. Without skilled OT for lymphedema care the condition will worsen and further functional decline is expected  Custom-made gradient compression garments and HOS devices are medically necessary because they are uniquely sized and shaped to fit the exact  dimensions of the affected extremities, and to provide appropriate medical grade, graduated compression essential for optimally managing chronic, progressive lymphedema. Multiple custom compression garments are needed to ensure proper hygiene to limit infection risk. Custom compression garments should be replaced q 3-6 months When worn consistently for optimal lipo-lymphedema self-management over time. HOS devices, medically necessary to limit fibrosis buildup in tissue, should be replaced q 2 years and PRN when worn out.      OBJECTIVE IMPAIRMENTS: decreased activity tolerance, decreased knowledge of condition, decreased knowledge of use of DME, increased edema, impaired sensation, pain, and chronic, progressive leg swelling .   ACTIVITY LIMITATIONS: dependent sitting, extended standing and/ or walking, squatting, and lower body dressing, fitting preferred  street shoes  PARTICIPATION LIMITATIONS: shopping, community activity, occupation, and yard work  PERSONAL FACTORS: 3+ comorbidities: Hx DVT,  OSA, CVI. Varicose veins  are also affecting patient's functional outcome.   REHAB POTENTIAL: Good  EVALUATION COMPLEXITY: Moderate   GOALS: Goals reviewed with patient? Yes  SHORT TERM GOALS: Target date: 4th OT Rx visit  SHORT TERM GOALS: Target date: 4th OT Rx visit   Pt will demonstrate understanding of lymphedema precautions and prevention strategies with modified independence using a printed reference to identify at least 5 precautions and discussing how s/he may implement them into daily life to reduce risk of progression with extra time. Baseline:Max A Goal status: GOAL MET   Pt will be able to apply multilayer, knee length, gradient, compression wraps to one leg at a time with modified assistance (extra time and assistive device/s) to decrease limb volume, to limit infection risk, and to limit lymphedema progression.  Baseline: Dependent Goal status: GOAL DISCHARGED. Pt Going to  compression garments instead  LONG TERM GOALS: Target date: 02/08/23  Given this patient's Intake score of 67%/100% on the functional outcomes FOTO tool, patient will experience an increase in function of 5 points to improve basic and instrumental ADLs performance, including lymphedema self-care.  Baseline: Max A Goal status:GOAL DISCHARGED.  FOTO TOOL DISCONTINUED AT CLINIC   Given this patient's Intake score of 50% on the Lymphedema Life Impact Scale (LLIS), patient will experience a reduction of at least 5 points in her perceived level of functional impairment resulting from lymphedema to improve functional performance and quality of life (QOL). Baseline: % Goal status: PROGRESSING  Pt will achieve at least a 10% volume reduction in B legs to return limb to typical size and shape, to limit infection risk and LE progression, to decrease pain, to improve function. Baseline: Dependent Goal status: PROGRESSING  4.  Pt will obtain proper compression garments/devices and achieve modified independence (extra time + assistive devices) with donning/doffing to optimize limb volume reductions and limit LE  progression over time. Baseline:  Goal status: ONGOING. Garments  5.  During Intensive phase CDT , with modified independence, Pt will achieve at least 85% compliance with all lymphedema self-care home program components, including daily skin care, compression wraps and /or garments, simple self MLD and lymphatic pumping therex to habituate LE self care protocol  into ADLs for optimal LE self-management over time. Baseline: Dependent Goal status: PROGRESSING   PLAN:  OT FREQUENCY: 2x/week  OT DURATION: 12 weeks  PLANNED INTERVENTIONS: 97110-Therapeutic exercises, 97530- Therapeutic activity, 97535- Self Care, 28413- Manual therapy, Patient/Family education, Manual lymph drainage, Compression bandaging, and fit with appropriate compression garments  PLAN FOR NEXT SESSION:  Cont MLD Cont Pt  edu for LE self-care  Loel Dubonnet, MS, OTR/L, CLT-LANA 03/01/23 4:05 PM '

## 2023-03-02 ENCOUNTER — Ambulatory Visit: Payer: Commercial Managed Care - PPO

## 2023-03-02 ENCOUNTER — Ambulatory Visit: Payer: Commercial Managed Care - PPO | Admitting: Allergy & Immunology

## 2023-03-02 ENCOUNTER — Other Ambulatory Visit: Payer: Self-pay

## 2023-03-02 ENCOUNTER — Ambulatory Visit: Payer: Commercial Managed Care - PPO | Admitting: Podiatry

## 2023-03-02 ENCOUNTER — Other Ambulatory Visit (HOSPITAL_COMMUNITY): Payer: Self-pay

## 2023-03-02 ENCOUNTER — Ambulatory Visit: Payer: Commercial Managed Care - PPO | Admitting: Occupational Therapy

## 2023-03-02 ENCOUNTER — Encounter: Payer: Self-pay | Admitting: Allergy & Immunology

## 2023-03-02 ENCOUNTER — Other Ambulatory Visit: Payer: Self-pay | Admitting: Dermatology

## 2023-03-02 VITALS — BP 114/72 | HR 76 | Temp 98.1°F | Resp 16

## 2023-03-02 DIAGNOSIS — Z6832 Body mass index (BMI) 32.0-32.9, adult: Secondary | ICD-10-CM | POA: Diagnosis not present

## 2023-03-02 DIAGNOSIS — M3501 Sicca syndrome with keratoconjunctivitis: Secondary | ICD-10-CM | POA: Diagnosis not present

## 2023-03-02 DIAGNOSIS — R279 Unspecified lack of coordination: Secondary | ICD-10-CM | POA: Diagnosis not present

## 2023-03-02 DIAGNOSIS — R0602 Shortness of breath: Secondary | ICD-10-CM | POA: Diagnosis not present

## 2023-03-02 DIAGNOSIS — B999 Unspecified infectious disease: Secondary | ICD-10-CM | POA: Diagnosis not present

## 2023-03-02 DIAGNOSIS — R198 Other specified symptoms and signs involving the digestive system and abdomen: Secondary | ICD-10-CM

## 2023-03-02 DIAGNOSIS — R7982 Elevated C-reactive protein (CRP): Secondary | ICD-10-CM

## 2023-03-02 DIAGNOSIS — L280 Lichen simplex chronicus: Secondary | ICD-10-CM | POA: Diagnosis not present

## 2023-03-02 DIAGNOSIS — M5459 Other low back pain: Secondary | ICD-10-CM

## 2023-03-02 DIAGNOSIS — K580 Irritable bowel syndrome with diarrhea: Secondary | ICD-10-CM | POA: Diagnosis not present

## 2023-03-02 DIAGNOSIS — R102 Pelvic and perineal pain: Secondary | ICD-10-CM

## 2023-03-02 DIAGNOSIS — D75A Glucose-6-phosphate dehydrogenase (G6PD) deficiency without anemia: Secondary | ICD-10-CM

## 2023-03-02 DIAGNOSIS — M62838 Other muscle spasm: Secondary | ICD-10-CM

## 2023-03-02 DIAGNOSIS — M6281 Muscle weakness (generalized): Secondary | ICD-10-CM | POA: Diagnosis not present

## 2023-03-02 DIAGNOSIS — E669 Obesity, unspecified: Secondary | ICD-10-CM | POA: Diagnosis not present

## 2023-03-02 DIAGNOSIS — M79642 Pain in left hand: Secondary | ICD-10-CM | POA: Diagnosis not present

## 2023-03-02 DIAGNOSIS — R5383 Other fatigue: Secondary | ICD-10-CM | POA: Diagnosis not present

## 2023-03-02 DIAGNOSIS — M2550 Pain in unspecified joint: Secondary | ICD-10-CM | POA: Diagnosis not present

## 2023-03-02 DIAGNOSIS — M79641 Pain in right hand: Secondary | ICD-10-CM | POA: Diagnosis not present

## 2023-03-02 LAB — SURGICAL PATHOLOGY

## 2023-03-02 NOTE — Therapy (Signed)
 OUTPATIENT PHYSICAL THERAPY FEMALE PELVIC TREATMENT   Patient Name: Karen [name] MRN: [id number] DOB:[birth year], 53 y.o., female Today's Date: 03/02/2023  END OF SESSION:  PT End of Session - 03/02/23 1532     Visit Number 31    Date for PT Re-Evaluation 04/26/23    Authorization Type [employer]    PT Start Time 1530    PT Stop Time 1610    PT Time Calculation (min) 40 min    Activity Tolerance Patient tolerated treatment well    Behavior During Therapy [facility] for tasks assessed/performed                     Past Medical History:  Diagnosis Date   Adenomyosis    Anemia    Arthritis 2013   L knee gets steroid injections    Asthmatic bronchitis 01/31/2017   Back pain    Chronic constipation    Diarrhea    DVT of lower extremity (deep venous thrombosis) (HCC)    Dysmenorrhea    Endometriosis    Esophageal stricture    GERD (gastroesophageal reflux disease)    History of kidney stones    History of uterine fibroid    IBS (irritable bowel syndrome)    Interstitial cystitis    Lactose intolerance    Migraine    with aura   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   PONV (postoperative nausea and vomiting)    Recurrent upper respiratory infection (URI)    Sebaceous cyst of breast, right lower inner quadrant 10/25/2012   Excised 11/21/12    Sleep apnea    Syncope, non cardiac    Past Surgical History:  Procedure Laterality Date   ARTHROSCOPIC HAGLUNDS REPAIR     BREAST CYST EXCISION Right 11/21/2012   Procedure: CYST EXCISION BREAST;  Surgeon: Currie Paris, MD;  Location: [facility];  Service: General;  Laterality: Right;   BREAST CYST EXCISION Bilateral 01/18/2022   Procedure: EXCISION OF SEBACEOUS CYST BILATERAL BREAST;  Surgeon: Harriette Bouillon, MD;  Location: [facility];  Service: General;  Laterality: Bilateral;   BREAST EXCISIONAL BIOPSY Right 11/2012   CESAREAN SECTION  04/19/1993   CHOLECYSTECTOMY      COLONOSCOPY  05/02/2011   Procedure: COLONOSCOPY;  Surgeon: Vertell Novak., MD;  Location: [facility];  Service: Endoscopy;  Laterality: N/A;   colonoscopy  11/04/2014   DENTAL SURGERY  03/06/2018   2 surgeries ( 03/06/2018 and 04/06/2018)   to remove two separate benign tumors   ESOPHAGEAL MANOMETRY N/A 11/04/2015   Procedure: ESOPHAGEAL MANOMETRY (EM);  Surgeon: Carman Ching, MD;  Location: [facility];  Service: Endoscopy;  Laterality: N/A;   ESOPHAGOGASTRODUODENOSCOPY  05/02/2011   Procedure: ESOPHAGOGASTRODUODENOSCOPY (EGD);  Surgeon: Vertell Novak., MD;  Location: [facility];  Service: Endoscopy;  Laterality: N/A;   ESOPHAGOGASTRODUODENOSCOPY N/A 09/01/2014   Procedure: ESOPHAGOGASTRODUODENOSCOPY (EGD);  Surgeon: Carman Ching, MD;  Location: [facility];  Service: Endoscopy;  Laterality: N/A;   KNEE SURGERY     LAPAROSCOPY     x 4   LESION REMOVAL N/A 01/18/2022   Procedure: EXCISION OF SEBACEOUS CYSTS CHEST AND NECK;  Surgeon: Harriette Bouillon, MD;  Location: [facility];  Service: General;  Laterality: N/A;   PH IMPEDANCE STUDY N/A 11/04/2015   Procedure: PH IMPEDANCE STUDY;  Surgeon: Carman Ching, MD;  Location: [facility];  Service: Endoscopy;  Laterality: N/A;   VAGINAL HYSTERECTOMY  2010   TVH--ovaries remain  Patient Active Problem List   Diagnosis Date Noted   Sjogren syndrome with keratoconjunctivitis (HCC) 12/09/2022   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   Elevated LFTs 04/04/2021   Colitis 04/04/2021   Bacterial overgrowth syndrome 05/21/2020   Obesity 05/21/2020   History of endometriosis 05/21/2020   Constipation due to outlet dysfunction 02/05/2020   Gastroesophageal reflux disease 02/05/2020   Obstructive sleep apnea treated with continuous positive airway pressure (CPAP) 06/16/2017   Perennial allergic rhinitis with a nonallergic component 01/31/2017   Asthmatic bronchitis 01/31/2017   GI symptoms 01/31/2017   Family history of  colon cancer 01/27/2015   Chest pain 12/13/2012   Dyspnea 12/13/2012   DVT of lower extremity (deep venous thrombosis) (HCC) 01/03/1990    PCP: Lorenda Ishihara, MD  REFERRING PROVIDER: Jerene Bears, MD   REFERRING DIAG: M62.89 (ICD-10-CM) - Pelvic floor dysfunction  THERAPY DIAG:  Pelvic pain  Other low back pain  Muscle weakness (generalized)  Other muscle spasm  Unspecified lack of coordination  Rationale for Evaluation and Treatment: Rehabilitation  ONSET DATE: chronic  SUBJECTIVE:                                                                                                                                                                                           SUBJECTIVE STATEMENT:  Pt states that she has been to see local dermatologist again. She was starting to get irritated in vulva again so has resumed creams. The dermatologist is wondering if it is not related to ulcerative colitis and crohn's disease. Pt reports more low back pain and feels like it is due to stool burden, she has bene instructed to do a partial cleanse.    PAIN: 03/02/23 Are you having pain? Yes NPRS scale: 2/10 Pain location:  Lower abdominal pain, Lt sided pain, low back pain  Pain type: turning, knotted, pressure Pain description: intermittent and constant   Aggravating factors: constipation or frequent bowel movements/constantly having bowel movements  Relieving factors: exercises (bowel massage, happy baby, spinal twists, walking)  PRECAUTIONS: None  WEIGHT BEARING RESTRICTIONS: No  FALLS:  Has patient fallen in last 6 months? No  LIVING ENVIRONMENT: Lives with: lives with their spouse Lives in: House/apartment  OCCUPATION: nurse, in admin  PLOF: Independent  PATIENT GOALS: decrease pain and have more normal bowel movements  PERTINENT HISTORY:  Vaginal hysterectomy, DVT LE, endometriosis, interstitial cystitis, exploratory laps for endometriosis, c-section,  cholecystectomy Sexual abuse: No  BOWEL MOVEMENT: Pain with bowel movement: Yes Type of bowel movement:Type (Bristol Stool Scale) 1-7 (sometimes full range in the same bowel movement), Frequency sometimes many times a day, sometimes up to 4  days in between, and Strain Yes Fully empty rectum: No Leakage: No Pads: No Fiber supplement: No - has attempted metamucil and it made constipation worse  URINATION: Pain with urination: Yes Fully empty bladder: No Stream:  varies  Urgency: Yes: all the time Frequency: multiple times an hours  Leakage: Urge to void, Walking to the bathroom, Coughing, Sneezing, Laughing, and Exercise Pads: Yes: daily  INTERCOURSE: Pain with intercourse: Initial Penetration and During Penetration Ability to have vaginal penetration:  Yes: with pain Climax: non painful WNL   PREGNANCY: Vaginal deliveries 1 Tearing Yes: 3rd degree tear C-section deliveries 1 Currently pregnant No  PROLAPSE: Periodically will feel vaginal bulging, heaviness in lower abdomen   OBJECTIVE:  11/09/22: No internal rectal or vaginal pelvic floor exam performed due to high level of skin irritation   Weak transversus abdominus contraction  Abdominal restriction and tenderness in bil lower quadrant  Unable to perform pelvic tilt with appropriate coordination  LUMBARAROM/PROM:  A/PROM A/PROM  Eval (% available) 11/09/22 (% available)  Flexion 50 90  Extension 25, pain anteriorly  25, pain in low back  Right lateral flexion 50, pain anteriorly  75, pain on right  Left lateral flexion 50, pain anteriorly  75, pain on right   (Blank rows = not tested) 07/21/22: standing prolapse assessment demonstrated grade 2 anterior vaginal wall laxity   06/08/22:               External Perineal Exam: WNL                              Internal Pelvic Floor: burning reported with palpation of superficial muscles; deep aching/pressure with palpation of Lt levator ani   Patient confirms  identification and approves PT to assess internal pelvic floor and treatment Yes  PELVIC MMT:   MMT eval  Vaginal 3/5  Internal Anal Sphincter 1/5  External Anal Sphincter 2/5  Puborectalis 1/5  Diastasis Recti   (Blank rows = not tested)        TONE: High in Lt levator ani   PROLAPSE: Not able to tell this session due to dyssynergic pelvic floor contraction     06/01/22: COGNITION: Overall cognitive status: Within functional limits for tasks assessed     SENSATION: Light touch: Appears intact Proprioception: Appears intact  GAIT: Comments: decreased hip extension, forward flexed trunk  POSTURE: rounded shoulders, forward head, decreased lumbar lordosis, increased thoracic kyphosis, posterior pelvic tilt, and flexed trunk    LUMBARAROM/PROM:  A/PROM A/PROM  Eval (% available)  Flexion 50  Extension 25, pain anteriorly   Right lateral flexion 50, pain anteriorly   Left lateral flexion 50, pain anteriorly    (Blank rows = not tested)   PALPATION:   General  Significant abdominal restriction and tenderness; decreased rib mobility with mobilization and breathing    TODAY'S TREATMENT:  DATE:  03/02/23 Manual: Abdominal soft tissue mobilization  Bowel mobilization Ileocecal valve myofascial release Quadruped myofascial release to lower thoracic/lumbar paraspinals  Neuromuscular re-education: Supine posterior pelvic tilts on dynadisc 2 x 10 Supine march on dynadisc 2 x 10 Lower trunk rotation on dynadisc 2 x 10  02/21/23 Manual: Abdominal soft tissue mobilization  Bowel mobilization Ileocecal valve myofascial release Neuromuscular re-education: Bridge with hip adduction, transversus abdominus, and pelvic floor muscle 2 x 10 Bridge with bil abduction, transversus abdominus, and pelvic floor muscle 2 x 10, red band  Supine resisted  march red band 2 x 10 Supine leg extensions 10x bil   02/14/23 Manual: Abdominal soft tissue mobilization  Bowel mobilization Ileocecal valve myofascial release Neuromuscular re-education: Bil supine UE ball press with transversus abdominus and pelvic floor muscle contractions and breath coordination 10x Supine hip adduction ball press with transversus abdominus and pelvic floor muscle contractions and breath coordination 10x Bridge with hip adduction, transversus abdominus, and pelvic floor muscle 2 x 10 Supine leg extensions with transversus abdominus activation 10x bil    PATIENT EDUCATION:  Education details: See above Person educated: Patient Education method: Programmer, multimedia, Facilities manager, Actor cues, Verbal cues, and Handouts Education comprehension: verbalized understanding  HOME EXERCISE PROGRAM: [id number]  ASSESSMENT:  CLINICAL IMPRESSION: Pt having more constipation and increased stool burden present on x-ray of low back. We performed quadruped myofascial release to help decrease tension that could be contributing to this in addition to abdominal manual techniques. She reported good improvement in tightness afterwards. Good tolerance to mobility/core strengthening activities at end of session with no increase in discomfort. She will continue to benefit from skilled PT intervention in order to decrease pain, improve bowel movements, and decrease urinary urgency/incontinence.     OBJECTIVE IMPAIRMENTS: decreased activity tolerance, decreased coordination, decreased endurance, decreased mobility, decreased strength, increased fascial restrictions, increased muscle spasms, impaired tone, postural dysfunction, and pain.   ACTIVITY LIMITATIONS: lifting, bending, continence, and locomotion level  PARTICIPATION LIMITATIONS: interpersonal relationship, community activity, and occupation  PERSONAL FACTORS: 1 comorbidity: medical history  are also affecting patient's functional  outcome.   REHAB POTENTIAL: Good  CLINICAL DECISION MAKING: Stable/uncomplicated  EVALUATION COMPLEXITY: Low   GOALS: Goals reviewed with patient? Yes  SHORT TERM GOALS: Target date: 07/06/22 - updated 07/12/22 - updated 08/18/22 - updated 09/22/22 - updated 10/26/22 - updated 11/09/22 - updated 12/15/22 updated 01/18/23  Pt will be independent with HEP.   Baseline: Goal status: MET 07/12/22  2.  Pt will be independent with diaphragmatic breathing and down training activities in order to improve pelvic floor relaxation.  Baseline:  Goal status: MET 07/12/22  3.  Pt will be independent with the knack, urge suppression technique, and double voiding in order to improve bladder habits and decrease urinary incontinence.   Baseline:  Goal status: MET 09/22/22  4.  Pt will be independent with use of squatty potty, relaxed toileting mechanics, and improved bowel movement techniques in order to increase ease of bowel movements and complete evacuation.   Baseline:  Goal status: MET 11/09/22  5.  Pt will be able to correctly perform diaphragmatic breathing and appropriate pressure management in order to prevent worsening vaginal wall laxity and improve pelvic floor A/ROM.   Baseline:  Goal status: MET 01/18/23  LONG TERM GOALS: Target date: 11/16/2022 - updated 07/12/22 - updated 08/18/22 - updated 09/22/22 - updated 10/26/22 - updated 11/09/22 - updated 12/15/22 - updated 02/21/23  Pt will be independent with advanced HEP.   Baseline:  Goal status: IN PROGRESS 02/21/23  2.  Pt will demonstrate normal pelvic floor muscle tone and A/ROM, able to achieve 4/5 strength with contractions and 10 sec endurance, in order to provide appropriate lumbopelvic support in functional activities.   Baseline:  Goal status: IN PROGRESS 02/21/23  3.  Pt will increase all impaired lumbar A/ROM by 25% without pain.  Baseline:  Goal status:  IN PROGRESS 02/21/23  4.  Pt will report pain no higher than 3/10 with any  activity. Baseline:  Goal status:  IN PROGRESS 02/21/23  5.  Pt will report 0/10 pain with vaginal penetration in order to improve intimate relationship with partner.    Baseline:  Goal status:  IN PROGRESS 02/21/23  6.  Pt will be able to go 2-3 hours in between voids without urgency or incontinence in order to improve QOL and perform all functional activities with less difficulty.   Baseline:  Goal status: MET 02/21/23  7.  Pt will report no leaks with laughing, coughing, sneezing in order to improve comfort with interpersonal relationships and community activities.   Baseline:  Goal status:  IN PROGRESS 02/21/23  8.  Pt will have consistent bowel movements 4-5x/week without straining or pain.  Baseline:  Goal status:  MET 02/21/23  PLAN:  PT FREQUENCY: 1-2x/week  PT DURATION: 6 months  PLANNED INTERVENTIONS: Therapeutic exercises, Therapeutic activity, Neuromuscular re-education, Balance training, Gait training, Patient/Family education, Self Care, Joint mobilization, Dry Needling, Biofeedback, and Manual therapy  PLAN FOR NEXT SESSION: Continue to work on motor control activities surrounding pelvis; progress mobility; manual techniques as needed for comfort and to increase circulation.   Julio Alm, PT, DPT02/27/254:03 PM

## 2023-03-02 NOTE — Progress Notes (Signed)
 FOLLOW UP  Date of Service/Encounter:  03/02/23   Assessment:   SOB (shortness of breath) - largely resolved   3mm RML lung nodule (benign per imaging guidelines)   Diarrhea - with a history of SIBO s/p treatment by Dr. 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 Langston Reusing in January 2025  Plan/Recommendations:   1. SOB (shortness of breath) - Lung testing  not done today. - We are not going to make any changes at this point in time.  - You can continue with the albuterol 2 puffs every 4-6 hours.  2. Dysphagia - Continue with the Prevacid 30mg  daily.  - I will send my note to Dr. Wellington Hampshire. - I would bet that you are going to end up on Dupixent since you are still having symptoms even with the Prevacid twice daily.  - I might consider a trial of the Dupixent just to see if it would help with the GI issues and the itching. - We should know after a month or two whether this is working.   3. Recurrent sinusitis  - Continue with Flonase one spray per nostril daily.  - Your titers were very protective following the vaccination. - Hopefully this will help to decrease your frequency of infections.  4. G6PD deficiency  - I emailed you an article on this disorder. - I can refer you to our registered dietitian who works with our food allergy patients, as she might be able to give you some more guidance regarding her diet.  5. 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 - I will  do some literature review on this for you.  6. Return in about 6 months (around 08/30/2023).   Subjective:   VERNA DESROCHER is a 53 y.o. female presenting today for follow up of  Chief Complaint  Patient presents with   lab results    KARALYNN COTTONE has a history of the following: Patient Active Problem List   Diagnosis Date Noted   Sjogren syndrome with keratoconjunctivitis (HCC) 12/09/2022   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   Elevated LFTs 04/04/2021   Colitis 04/04/2021   Bacterial overgrowth syndrome 05/21/2020   Obesity 05/21/2020   History of endometriosis 05/21/2020   Constipation due to outlet dysfunction 02/05/2020   Gastroesophageal reflux disease 02/05/2020   Obstructive sleep apnea treated with continuous positive airway pressure (CPAP) 06/16/2017   Perennial allergic rhinitis with a nonallergic component 01/31/2017   Asthmatic bronchitis 01/31/2017   GI symptoms 01/31/2017   Family history of colon cancer 01/27/2015   Chest pain 12/13/2012   Dyspnea 12/13/2012   DVT of lower extremity (deep venous thrombosis) (HCC) 01/03/1990    History obtained from: chart review and patient.  Discussed the use of AI scribe software for clinical note transcription with the patient and/or guardian, who gave verbal consent to proceed.  Charonda is a 53 y.o. female presenting for a follow up visit.  She was last seen in January 2025 via video  visit.  At that time, she had received her streptococcal pneumonia vaccine and we obtained repeat titers.  We also decided to send a genetic test for inborn errors of immunity in order to hopefully answers more questions about why she is having her constellation of symptoms.  Her atopic disease, as always, seem to be under good control.  Her repeat streptococcal titers were fantastic.  Her titers improved in all the strains and she was protective to 21 out of 23 serotypes.  She did get her genetic test back and she was positive to several  different gene mutations.  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 She has a history of blood clots and is concerned about the potential for coagulopathy or thrombosis, as indicated by her genetic testing results. This can be seen with the PMM2 genetic mutation. However, this is typically noticed in infancy due to developmental delays.   She experiences abdominal pain early in the morning, accompanied by burning and itching sensations. She also noticed slightly bloody urine, which resolved as the day progressed. A urine test showed slightly low red blood cells but was otherwise normal.  She has a history of G6PD deficiency and is concerned about dietary restrictions. She consumes foods like chickpeas and lentils, which may exacerbate her symptoms. Symptoms such as itching and darker urine may be related to her G6PD deficiency. She has been doing a lot of reading into this. She is not taking any of the medications that put her at risk of a reaction.   She has a known heterozygous form of hemochromatosis. Recent lab work showed high ferritin levels and an iron saturation of 21%. Her family history is significant for hemochromatosis, with her niece and sister recently diagnosed with the condition.  The last time she was supposed to have a phlebotomy, her fatigue was pretty intense so they did not pursue one.   She reports ongoing skin irritation, which has been improving with treatment. She was prescribed a regimen including Dove soap, water wipes, and a combination of nystatin and tronolane ointment. She is seeing Dr. Onalee Hua who has been rather helpful with management of this condition.   Her shortness of breath has not been a problem at all.  She has not been using her albuterol at all.  She reports back pain, particularly on the left side, and has undergone an x-ray to rule out kidney  stones. She has been experiencing constipation.        Component     Latest Ref Rng 12/08/2022 01/24/2023  Pneumo Ab Type 1*     >1.3 ug/mL <0.1 (L)  1.4   Pneumo Ab Type 3*     >1.3 ug/mL <0.1 (L)  0.2 (L)   Pneumo Ab Type 4*     >1.3 ug/mL 0.2 (L)  1.8   Pneumo Ab Type 8*     >1.3 ug/mL <0.3 (L)  13.2   Pneumo Ab Type 9 (9N)*     >1.3 ug/mL 2.6  >37.0   Pneumo Ab Type 12 (26F)*     >1.3 ug/mL <0.1 (L)  2.5   Pneumo Ab Type 14*     >1.3 ug/mL 14.7  >30.6   Pneumo Ab Type 17 (69F)*     >1.3 ug/mL 0.7 (L)  17.9   Pneumo Ab Type 19 (72F)*     >1.3 ug/mL 1.8  4.3   Pneumo Ab Type  2*     >1.3 ug/mL 2.8  21.4   Pneumo Ab Type 20*     >1.3 ug/mL 1.8  12.8   Pneumo Ab Type 22 (65F)*     >1.3 ug/mL 0.2 (L)  0.6 (L)   Pneumo Ab Type 23 (10F)*     >1.3 ug/mL 0.6 (L)  >31.2   Pneumo Ab Type 26 (6B)*     >1.3 ug/mL 0.6 (L)  1.8   Pneumo Ab Type 34 (10A)*     >1.3 ug/mL 1.5  20.9   Pneumo Ab Type 43 (11A)*     >1.3 ug/mL 0.4 (L)  >11.6   Pneumo Ab Type 5*     >1.3 ug/mL 0.5 (L)  31.3   Pneumo Ab Type 51 (69F)*     >1.3 ug/mL 0.1 (L)  4.7   Pneumo Ab Type 54 (15B)*     >1.3 ug/mL 0.3 (L)  5.1   Pneumo Ab Type 56 (18C)*     >1.3 ug/mL 1.7  11.8   Pneumo Ab Type 57 (19A)*     >1.3 ug/mL 0.7 (L)  3.6   Pneumo Ab Type 68 (9V)*     >1.3 ug/mL 0.2 (L)  7.6   Pneumo Ab Type 70 (34F)*     >1.3 ug/mL 1.0 (L)  6.0      Otherwise, there have been no changes to her past medical history, surgical history, family history, or social history.    Review of systems otherwise negative other than that mentioned in the HPI.    Objective:   Blood pressure 114/72, pulse 76, temperature 98.1 F (36.7 C), temperature source Temporal, resp. rate 16, SpO2 98%. There is no height or weight on file to calculate BMI.    Physical Exam Vitals reviewed.  Constitutional:      Appearance: Normal appearance. She is well-developed.     Comments: Anxious.   HENT:     Head: Normocephalic and  atraumatic.     Right Ear: Tympanic membrane, ear canal and external ear normal. No drainage, swelling or tenderness. Tympanic membrane is not injected, scarred, erythematous, retracted or bulging.     Left Ear: Tympanic membrane, ear canal and external ear normal. No drainage, swelling or tenderness. Tympanic membrane is not injected, scarred, erythematous, retracted or bulging.     Nose: No nasal deformity, septal deviation, mucosal edema or rhinorrhea.     Right Turbinates: Enlarged, swollen and pale.     Left Turbinates: Enlarged, swollen and pale.     Right Sinus: No maxillary sinus tenderness or frontal sinus tenderness.     Left Sinus: No maxillary sinus tenderness or frontal sinus tenderness.     Comments: No polyps noted.    Mouth/Throat:     Lips: Pink.     Mouth: Mucous membranes are moist. Mucous membranes are not pale and not dry.     Pharynx: Uvula midline.     Comments: Cobblestoning minimally present.  Eyes:     General:        Right eye: No discharge.        Left eye: No discharge.     Conjunctiva/sclera: Conjunctivae normal.     Right eye: Right conjunctiva is not injected. No chemosis.    Left eye: Left conjunctiva is not injected. No chemosis.    Pupils: Pupils are equal, round, and reactive to light.  Cardiovascular:     Rate and Rhythm: Normal rate and regular rhythm.     Heart  sounds: Normal heart sounds.  Pulmonary:     Effort: Pulmonary effort is normal. No tachypnea, accessory muscle usage or respiratory distress.     Breath sounds: Normal breath sounds. No wheezing, rhonchi or rales.     Comments: Moving air well in all lung fields. No increased work of breathing noted.  Chest:     Chest wall: No tenderness.  Abdominal:     Tenderness: There is no abdominal tenderness. There is no guarding or rebound.  Lymphadenopathy:     Head:     Right side of head: No submandibular, tonsillar or occipital adenopathy.     Left side of head: No submandibular, tonsillar  or occipital adenopathy.     Cervical: No cervical adenopathy.  Skin:    General: Skin is warm.     Capillary Refill: Capillary refill takes less than 2 seconds.     Coloration: Skin is not pale.     Findings: No abrasion, erythema, petechiae or rash. Rash is not papular, urticarial or vesicular.     Comments: No eczematous or urticarial lesions noted. Skin is clear.   Neurological:     Mental Status: She is alert.  Psychiatric:        Behavior: Behavior is cooperative.      Diagnostic studies: none     Malachi Bonds, MD  [facility]

## 2023-03-02 NOTE — Progress Notes (Signed)
 Subjective:   Patient ID: Karen [name], female   DOB: 53 y.o.   MRN: [id number]   HPI Patient is concerned about redness about the big toe nail right after having had procedure several weeks ago.  States it has been mildly tender slight drainage wants it checked   ROS      Objective:  Physical Exam  Neurovascular status intact patient's right foot is healing well irritation of the right hallux medial border localized no other erythema edema     Assessment:  Paronychia of the right hallux low-grade     Plan:  H&P reviewed I like to put her on antibiotics but she has had a lot of problems her stomach with antibiotics will get a try to do continued soaks and bandage usage and we may have to open up the lateral border if it becomes a problem.  Gave her strict instructions of any increased erythema edema or drainage were to occur to let us know immediately

## 2023-03-03 ENCOUNTER — Encounter: Payer: Commercial Managed Care - PPO | Admitting: Occupational Therapy

## 2023-03-03 ENCOUNTER — Other Ambulatory Visit (HOSPITAL_COMMUNITY): Payer: Self-pay

## 2023-03-03 DIAGNOSIS — L280 Lichen simplex chronicus: Secondary | ICD-10-CM | POA: Diagnosis not present

## 2023-03-03 DIAGNOSIS — L900 Lichen sclerosus et atrophicus: Secondary | ICD-10-CM | POA: Diagnosis not present

## 2023-03-03 MED ORDER — TRIAMCINOLONE ACETONIDE 0.1 % EX OINT
1.0000 | TOPICAL_OINTMENT | Freq: Two times a day (BID) | CUTANEOUS | 5 refills | Status: DC
Start: 2023-03-03 — End: 2023-07-03
  Filled 2023-03-03: qty 30, 15d supply, fill #0

## 2023-03-04 NOTE — Patient Instructions (Addendum)
 1. SOB (shortness of breath) - Lung testing  not done today. - We are not going to make any changes at this point in time.  - You can continue with the albuterol 2 puffs every 4-6 hours.  2. Dysphagia - Continue with the Prevacid 30mg  daily.  - I will send my note to Dr. Wellington Hampshire. - I would bet that you are going to end up on Dupixent since you are still having symptoms even with the Prevacid twice daily.  - I might consider a trial of the Dupixent just to see if it would help with the GI issues and the itching. - We should know after a month or two whether this is working.   3. Recurrent sinusitis  - Continue with Flonase one spray per nostril daily.  - Your titers were very protective following the vaccination. - Hopefully this will help to decrease your frequency of infections.  4. G6PD deficiency  - I emailed you an article on this disorder. - I can refer you to our registered dietitian who works with our food allergy patients, as she might be able to give you some more guidance regarding her diet.  5. 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 - I will do some literature review on this for you.  5. Return in about 6 months (around 08/30/2023).    Please inform us of any Emergency Department visits, hospitalizations, or changes in symptoms. Call us before going to the ED for breathing or allergy symptoms since we might be able to fit you in for a sick visit. Feel free to contact us anytime with any questions, problems, or concerns.  It was a pleasure to see you today!  Websites that have reliable patient information: 1. American Academy of Asthma, Allergy, and Immunology: www.aaaai.org 2. Food Allergy Research and Education (FARE): foodallergy.org 3. Mothers of Asthmatics: http://www.asthmacommunitynetwork.org 4. American College of  Allergy, Asthma, and Immunology: www.acaai.org   COVID-19 Vaccine Information can be found at: [email or web address].[email or web address] For questions related to vaccine distribution or appointments, please email [email or web address] or call [phone].   We realize that you might be concerned about having an allergic reaction to the COVID19 vaccines. To help with that concern, WE ARE OFFERING THE COVID19 VACCINES IN OUR OFFICE! Ask the front desk for dates!     "Like" [place] on Facebook and Instagram for our latest updates!      A healthy democracy works best when [employer] participate! Make sure you are registered to vote! If you have moved or changed any of your contact information, you will need to get this updated before voting!  In some cases, you MAY be able to register to vote online: [email or web address]

## 2023-03-06 ENCOUNTER — Encounter: Payer: Self-pay | Admitting: Dermatology

## 2023-03-06 NOTE — Progress Notes (Signed)
 Hi Karen [name]  Dr. Onalee Hua reviewed your biopsy results and they showed the spots removed were a little "abnormal" but not cancerous.  No additional treatment is required.  We will continue to monitor the area for re-pigmentation during your annual skin exams. The detailed report is available to view in MyChart.  Have a great day!  Kind Regards,  Dr. Kermit Balo Care Team

## 2023-03-07 ENCOUNTER — Telehealth: Payer: Self-pay | Admitting: Allergy & Immunology

## 2023-03-07 ENCOUNTER — Ambulatory Visit: Payer: Self-pay | Attending: Surgery | Admitting: Occupational Therapy

## 2023-03-07 DIAGNOSIS — I890 Lymphedema, not elsewhere classified: Secondary | ICD-10-CM

## 2023-03-07 NOTE — Therapy (Signed)
 OUTPATIENT OCCUPATIONAL THERAPY TREATMENT NOTE  L LOWER EXTREMITY/ B LOWER QUADRANT LYMPHEDEMA  Patient Name: Karen [name] MRN: [id number] DOB:Aug 01, 1970, 53 y.o., female Today's Date: 03/07/2023  END OF SESSION:   OT End of Session - 03/07/23 1201     Visit Number 27    Number of Visits 36    Date for OT Re-Evaluation 05/17/23    OT Start Time 0803    OT Stop Time 0901    OT Time Calculation (min) 58 min    Activity Tolerance Patient tolerated treatment well;No increased pain    Behavior During Therapy [facility] for tasks assessed/performed               Past Medical History:  Diagnosis Date   Adenomyosis    Anemia    Arthritis 2013   L knee gets steroid injections    Asthmatic bronchitis 01/31/2017   Back pain    Chronic constipation    Diarrhea    DVT of lower extremity (deep venous thrombosis) (HCC)    Dysmenorrhea    Endometriosis    Esophageal stricture    GERD (gastroesophageal reflux disease)    History of kidney stones    History of uterine fibroid    IBS (irritable bowel syndrome)    Interstitial cystitis    Lactose intolerance    Migraine    with aura   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   PONV (postoperative nausea and vomiting)    Recurrent upper respiratory infection (URI)    Sebaceous cyst of breast, right lower inner quadrant 10/25/2012   Excised 11/21/12    Sleep apnea    Syncope, non cardiac    Past Surgical History:  Procedure Laterality Date   ARTHROSCOPIC HAGLUNDS REPAIR     BREAST CYST EXCISION Right 11/21/2012   Procedure: CYST EXCISION BREAST;  Surgeon: Currie Paris, MD;  Location: [facility];  Service: General;  Laterality: Right;   BREAST CYST EXCISION Bilateral 01/18/2022   Procedure: EXCISION OF SEBACEOUS CYST BILATERAL BREAST;  Surgeon: Harriette Bouillon, MD;  Location: [facility];  Service: General;  Laterality: Bilateral;   BREAST EXCISIONAL BIOPSY Right 11/2012   CESAREAN SECTION  04/19/1993    CHOLECYSTECTOMY     COLONOSCOPY  05/02/2011   Procedure: COLONOSCOPY;  Surgeon: Vertell Novak., MD;  Location: [facility];  Service: Endoscopy;  Laterality: N/A;   colonoscopy  11/04/2014   DENTAL SURGERY  03/06/2018   2 surgeries ( 03/06/2018 and 04/06/2018)   to remove two separate benign tumors   ESOPHAGEAL MANOMETRY N/A 11/04/2015   Procedure: ESOPHAGEAL MANOMETRY (EM);  Surgeon: Carman Ching, MD;  Location: [facility];  Service: Endoscopy;  Laterality: N/A;   ESOPHAGOGASTRODUODENOSCOPY  05/02/2011   Procedure: ESOPHAGOGASTRODUODENOSCOPY (EGD);  Surgeon: Vertell Novak., MD;  Location: [facility];  Service: Endoscopy;  Laterality: N/A;   ESOPHAGOGASTRODUODENOSCOPY N/A 09/01/2014   Procedure: ESOPHAGOGASTRODUODENOSCOPY (EGD);  Surgeon: Carman Ching, MD;  Location: [facility];  Service: Endoscopy;  Laterality: N/A;   KNEE SURGERY     LAPAROSCOPY     x 4   LESION REMOVAL N/A 01/18/2022   Procedure: EXCISION OF SEBACEOUS CYSTS CHEST AND NECK;  Surgeon: Harriette Bouillon, MD;  Location:  S[facility];  Service: General;  Laterality: N/A;   PH IMPEDANCE STUDY N/A 11/04/2015   Procedure: PH IMPEDANCE STUDY;  Surgeon: Carman Ching, MD;  Location: [facility];  Service: Endoscopy;  Laterality: N/A;   VAGINAL HYSTERECTOMY  2010   TVH--ovaries  remain   Patient Active Problem List   Diagnosis Date Noted   Sjogren syndrome with keratoconjunctivitis (HCC) 12/09/2022   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   Elevated LFTs 04/04/2021   Colitis 04/04/2021   Bacterial overgrowth syndrome 05/21/2020   Obesity 05/21/2020   History of endometriosis 05/21/2020   Constipation due to outlet dysfunction 02/05/2020   Gastroesophageal reflux disease 02/05/2020   Obstructive sleep apnea treated with continuous positive airway pressure (CPAP) 06/16/2017   Perennial allergic rhinitis with a nonallergic component 01/31/2017   Asthmatic bronchitis 01/31/2017   GI symptoms 01/31/2017    Family history of colon cancer 01/27/2015   Chest pain 12/13/2012   Dyspnea 12/13/2012   DVT of lower extremity (deep venous thrombosis) (HCC) 01/03/1990    PCP: Coral Else, MD  REFERRING PROVIDER: Lorenda Ishihara, MD  REFERRING DIAG: I89.0  THERAPY DIAG:  Lymphedema, not elsewhere classified  Rationale for Evaluation and Treatment: Rehabilitation  ONSET DATE: ~2014; exacerbation in 2023  SUBJECTIVE:                                                                                                                                                                                           SUBJECTIVE STATEMENT: Pt presents for OT to address lower quadrant and LLE lymphedema. Pt does not rate LE-related pain numerically this morning. Pt states she continues walking for exercise on a regular basis.   PERTINENT HISTORY: CVI, OSA (no CPAP), GERD, Esophageal stricture, IBS, Lactose intolerant, Diarrhea, Hx LE DVT  PAIN:  Are you having pain? Yes: NPRS scale: see SUBJECTIVE/10 Pain location: BLE, digestive discomfort, R flank pain Pain description: myalgia, tight, heavy, tingling, numbness Aggravating factors: standing, walking, dependent sitting > 30 minutes Relieving factors: elevation  PRECAUTIONS: Other: LYMPHEDEMA; Hx LLE DVT  RED FLAGS: Hx LLE DVT  WEIGHT BEARING RESTRICTIONS: No  FALLS:  Has patient fallen in last 6 months? No  LIVING ENVIRONMENT: Lives with: lives with spouse and daughter Lives in: House/apartment Stairs: Yes; Internal: 14 steps; yes and External: 4 steps; yes Has following equipment at home: None  OCCUPATION: [employer] operational officer, walking, desk work  LEISURE: family time  HAND DOMINANCE: right   PRIOR LEVEL OF FUNCTION: Independent  PATIENT GOALS: Feel better, be able to be more active without pain, wear preferred street shoes  OBJECTIVE: Note: Objective measures were completed at Evaluation unless otherwise  noted.  COGNITION:  Overall cognitive status: Within functional limits for tasks assessed   OBSERVATIONS / OTHER ASSESSMENTS:   POSTURE: WFL  LE ROM: WFL  LE MMT: WFL  LYMPHEDEMA ASSESSMENTS: non-Ca related  INFECTIONS: denies cellulitis and wound hx  BLE COMPARATIVE  LIMB VOLUMETRICS Initial 11/15/22  LANDMARK RIGHT (dominant)  R LEG (A-D) 3028.9 ml  R THIGH (E-G) 4809.60 ml  R FULL LIMB (A-G) 7838.5 ml  Limb Volume differential (LVD)  %  Volume change since initial %  Volume change overall V  (Blank rows = not tested)  LANDMARK LEFT  (Rx)  L LEG (A-D) 3189.9 ml  L THIGH (E-G) 4959.8 ml  L FULL LIMB (A-G) 8149.7 ml  Limb Volume differential (LVD)  LEG LVD = 5.32%, L>R; THIGH LVD = 3.12%, L>R; And full LLE LVD = 3.97%, L>R. 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 Obesity  Skin  Description Hyper-Keratosis Peau d' Orange Shiny Tight Fibrotic/ Indurated Fatty Doughy Spongy/ boggy       R>L x  x   Skin dry Flaky WNL Macerated   mildly      Color Redness Varicosities Blanching Hemosiderin Stain Mottled        x   Odor Malodorous Yeast Fungal infection  WNL      x   Temperature Warm Cool wnl     x    Pitting Edema   1+ 2+ 3+ 4+ Non-pitting         x   Girth Symmetrical Asymmetrical                   Distribution    L>R toes to groin    Stemmer Sign Positive Negative   +L -R   Lymphorrhea History Of:  Present Absent     x    Wounds History Of Present Absent Venous Arterial Pressure Sheer     x        Signs of Infection Redness Warmth Erythema Acute Swelling Drainage Borders                    Sensation Light Touch Deep pressure Hypersensitivity   In tact Impaired In tact Impaired Absent Impaired   x  x  x     Nails WNL   Fungus nail dystrophy   x     Hair Growth Symmetrical Asymmetrical   x    Skin Creases Base of  toes  Ankles   Base of Fingers knees       Abdominal pannus Thigh Lobules  Face/neck   x           GAIT: Distance walked: >500' Assistive device utilized: None Level of assistance: Complete Independence Comments: Functional ambulation and transfers [facility]  LYMPHEDEMA LIFE IMPACT SCALE (LLIS): Initial 11/15/22  50%  FOTO functional outcome measure: Deferred. FOTO discontinued.  TODAY'S TREATMENT:  MLD to BLQ emphasis on colon and LLE Pt edu for le SELF CARE  PATIENT EDUCATION:  Continued Pt/ CG edu for how function of cisterna chyli, part of the thoracic duct of deep abdominal lymphatics, assists with digestion by filtering many of the fats and proteins from the digestive system. Sub optimal lymphatic flow in this region may result in constipation, bloating, swelling, etc. MLD helps mobilize these protein and fats , and the rhythmic movement of manual lymphatic drainage stimulates digestive muscles and increase peristalsis. Provided printed resource for reference.  Topics include outcome of comparative limb volumetrics- starting limb volume differentials (LVDs), technology and gradient techniques used for short stretch, multilayer compression wrapping, simple self-MLD, therapeutic lymphatic pumping exercises, skin/nail care, LE precautions,. compression garment recommendations and specifications, wear and care schedule and compression garment donning / doffing w assistive devices. Discussed progress towards all OT goals since commencing CDT. All questions answered to the Pt's satisfaction. Good return. Person educated: Patient  Education method: Explanation, Demonstration, and Handouts Education comprehension: verbalized understanding, returned demonstration, verbal cues required, and needs further education  HOME EXERCISE PROGRAM: BLE lymphatic pumping  there ex using- 1 sets of 10 reps, each exercise in order-  1-2 x daily, bilaterally Simple self MLD 1 x daily Daily skin care to increase hydration, skin mobility and decrease infection risk- can be done during MLD Compression wraps 23/7 during intensive phase of CDT Compression garments/ devices during self-management phase of CDT  ASSESSMENT:  CLINICAL IMPRESSION:  Pt tolerated abdominal MLD and MLD to LLE/LLQ without increased pain.  Utilizing deep abdominal lymphatic pathways, including alternate pressure and release on descending, then transverse, then ascending colon and diaphragmatic breathing. Pt  continues to benefit from skilled OT for Lymphedema care to reduce pain and digestive discomfort. Cont as per POC.  11/15/22 Initial Evaluation: Karen [name] is a 53 y.o. female presenting with very mild, stage II, LLE lymphedema 2/2 venous insufficiency and obesity. L Leg swelling and associated pain swelling fluctuates. It has progressed over time, and now no longer resolves with elevation over night. RLE swelling limits balance during functional ambulation. It exacerbates infection and falls risk. LLE/RLQ lymphedema interferes with functional performance in all occupational domains, including basic and instrumental ADLs, productive activities, leisure pursuits and social participation. Pt will benefit from Occupational Therapy for Complete Decongestive Therapy (CDT) to restore function, to reduce physical and psychologic suffering associated with chronic, progressive lymphedema and associated pain, and to limit infection. CDT will include manual lymphatic drainage (MLD), skin care, therapeutic exercise and compression wraps and garment's devices. Without skilled OT for lymphedema care the condition will worsen and further functional decline is expected  Custom-made gradient compression garments and HOS devices are medically necessary because they are uniquely sized and shaped to fit the exact  dimensions of the affected extremities, and to provide appropriate medical grade, graduated compression essential for optimally managing chronic, progressive lymphedema. Multiple custom compression garments are needed to ensure proper hygiene to limit infection risk. Custom compression garments should be replaced q 3-6 months When worn consistently for optimal lipo-lymphedema self-management over time. HOS devices, medically necessary to limit fibrosis buildup in tissue, should be replaced q 2 years and PRN when worn out.      OBJECTIVE IMPAIRMENTS: decreased activity tolerance, decreased knowledge of condition, decreased knowledge of use of DME, increased edema, impaired sensation, pain, and chronic, progressive leg swelling .   ACTIVITY LIMITATIONS: dependent sitting, extended standing and/ or walking, squatting, and lower body dressing, fitting preferred  street shoes  PARTICIPATION LIMITATIONS: shopping, community activity, occupation, and yard work  PERSONAL FACTORS: 3+ comorbidities: Hx DVT,  OSA, CVI. Varicose veins  are also affecting patient's functional outcome.   REHAB POTENTIAL: Good  EVALUATION COMPLEXITY: Moderate   GOALS: Goals reviewed with patient? Yes  SHORT TERM GOALS: Target date: 4th OT Rx visit  SHORT TERM GOALS: Target date: 4th OT Rx visit   Pt will demonstrate understanding of lymphedema precautions and prevention strategies with modified independence using a printed reference to identify at least 5 precautions and discussing how s/he may implement them into daily life to reduce risk of progression with extra time. Baseline:Max A Goal status: GOAL MET   Pt will be able to apply multilayer, knee length, gradient, compression wraps to one leg at a time with modified assistance (extra time and assistive device/s) to decrease limb volume, to limit infection risk, and to limit lymphedema progression.  Baseline: Dependent Goal status: GOAL DISCHARGED. Pt Going to  compression garments instead  LONG TERM GOALS: Target date: 02/08/23  Given this patient's Intake score of 67%/100% on the functional outcomes FOTO tool, patient will experience an increase in function of 5 points to improve basic and instrumental ADLs performance, including lymphedema self-care.  Baseline: Max A Goal status:GOAL DISCHARGED.  FOTO TOOL DISCONTINUED AT CLINIC   Given this patient's Intake score of 50% on the Lymphedema Life Impact Scale (LLIS), patient will experience a reduction of at least 5 points in her perceived level of functional impairment resulting from lymphedema to improve functional performance and quality of life (QOL). Baseline: % Goal status: PROGRESSING  Pt will achieve at least a 10% volume reduction in B legs to return limb to typical size and shape, to limit infection risk and LE progression, to decrease pain, to improve function. Baseline: Dependent Goal status: PROGRESSING  4.  Pt will obtain proper compression garments/devices and achieve modified independence (extra time + assistive devices) with donning/doffing to optimize limb volume reductions and limit LE  progression over time. Baseline:  Goal status: ONGOING. Garments  5.  During Intensive phase CDT , with modified independence, Pt will achieve at least 85% compliance with all lymphedema self-care home program components, including daily skin care, compression wraps and /or garments, simple self MLD and lymphatic pumping therex to habituate LE self care protocol  into ADLs for optimal LE self-management over time. Baseline: Dependent Goal status: PROGRESSING   PLAN:  OT FREQUENCY: 2x/week  OT DURATION: 12 weeks  PLANNED INTERVENTIONS: 97110-Therapeutic exercises, 97530- Therapeutic activity, 97535- Self Care, 16109- Manual therapy, Patient/Family education, Manual lymph drainage, Compression bandaging, and fit with appropriate compression garments  PLAN FOR NEXT SESSION:  Cont MLD Cont Pt  edu for LE self-care  Loel Dubonnet, MS, OTR/L, CLT-LANA 03/07/23 12:27 PM '

## 2023-03-07 NOTE — Telephone Encounter (Signed)
 Patient called and stated she need genetic results sent to test to providers and requested a call back at [phone].

## 2023-03-09 ENCOUNTER — Ambulatory Visit: Payer: Commercial Managed Care - PPO | Admitting: Allergy & Immunology

## 2023-03-10 ENCOUNTER — Ambulatory Visit: Payer: Self-pay | Admitting: Occupational Therapy

## 2023-03-10 DIAGNOSIS — I890 Lymphedema, not elsewhere classified: Secondary | ICD-10-CM

## 2023-03-10 NOTE — Therapy (Signed)
 OUTPATIENT OCCUPATIONAL THERAPY TREATMENT NOTE  L LOWER EXTREMITY/ B LOWER QUADRANT LYMPHEDEMA  Patient Name: Karen [name] MRN: [id number] DOB:04/04/1970, 53 y.o., female Today's Date: 03/10/2023  END OF SESSION:   OT End of Session - 03/10/23 0907     Visit Number 28    Number of Visits 36    Date for OT Re-Evaluation 05/17/23    OT Start Time 0902    OT Stop Time 1005    OT Time Calculation (min) 63 min    Activity Tolerance Patient tolerated treatment well;No increased pain    Behavior During Therapy [facility] for tasks assessed/performed               Past Medical History:  Diagnosis Date   Adenomyosis    Anemia    Arthritis 2013   L knee gets steroid injections    Asthmatic bronchitis 01/31/2017   Back pain    Chronic constipation    Diarrhea    DVT of lower extremity (deep venous thrombosis) (HCC)    Dysmenorrhea    Endometriosis    Esophageal stricture    GERD (gastroesophageal reflux disease)    History of kidney stones    History of uterine fibroid    IBS (irritable bowel syndrome)    Interstitial cystitis    Lactose intolerance    Migraine    with aura   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   PONV (postoperative nausea and vomiting)    Recurrent upper respiratory infection (URI)    Sebaceous cyst of breast, right lower inner quadrant 10/25/2012   Excised 11/21/12    Sleep apnea    Syncope, non cardiac    Past Surgical History:  Procedure Laterality Date   ARTHROSCOPIC HAGLUNDS REPAIR     BREAST CYST EXCISION Right 11/21/2012   Procedure: CYST EXCISION BREAST;  Surgeon: Currie Paris, MD;  Location: [facility];  Service: General;  Laterality: Right;   BREAST CYST EXCISION Bilateral 01/18/2022   Procedure: EXCISION OF SEBACEOUS CYST BILATERAL BREAST;  Surgeon: Harriette Bouillon, MD;  Location: [facility];  Service: General;  Laterality: Bilateral;   BREAST EXCISIONAL BIOPSY Right 11/2012   CESAREAN SECTION  04/19/1993    CHOLECYSTECTOMY     COLONOSCOPY  05/02/2011   Procedure: COLONOSCOPY;  Surgeon: Vertell Novak., MD;  Location: [facility];  Service: Endoscopy;  Laterality: N/A;   colonoscopy  11/04/2014   DENTAL SURGERY  03/06/2018   2 surgeries ( 03/06/2018 and 04/06/2018)   to remove two separate benign tumors   ESOPHAGEAL MANOMETRY N/A 11/04/2015   Procedure: ESOPHAGEAL MANOMETRY (EM);  Surgeon: Carman Ching, MD;  Location: [facility];  Service: Endoscopy;  Laterality: N/A;   ESOPHAGOGASTRODUODENOSCOPY  05/02/2011   Procedure: ESOPHAGOGASTRODUODENOSCOPY (EGD);  Surgeon: Vertell Novak., MD;  Location: [facility];  Service: Endoscopy;  Laterality: N/A;   ESOPHAGOGASTRODUODENOSCOPY N/A 09/01/2014   Procedure: ESOPHAGOGASTRODUODENOSCOPY (EGD);  Surgeon: Carman Ching, MD;  Location: [facility];  Service: Endoscopy;  Laterality: N/A;   KNEE SURGERY     LAPAROSCOPY     x 4   LESION REMOVAL N/A 01/18/2022   Procedure: EXCISION OF SEBACEOUS CYSTS CHEST AND NECK;  Surgeon: Harriette Bouillon, MD;  Location: [facility];  Service: General;  Laterality: N/A;   PH IMPEDANCE STUDY N/A 11/04/2015   Procedure: PH IMPEDANCE STUDY;  Surgeon: Carman Ching, MD;  Location: [facility];  Service: Endoscopy;  Laterality: N/A;   VAGINAL HYSTERECTOMY  2010   TVH--ovaries  remain   Patient Active Problem List   Diagnosis Date Noted   Sjogren syndrome with keratoconjunctivitis (HCC) 12/09/2022   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   Elevated LFTs 04/04/2021   Colitis 04/04/2021   Bacterial overgrowth syndrome 05/21/2020   Obesity 05/21/2020   History of endometriosis 05/21/2020   Constipation due to outlet dysfunction 02/05/2020   Gastroesophageal reflux disease 02/05/2020   Obstructive sleep apnea treated with continuous positive airway pressure (CPAP) 06/16/2017   Perennial allergic rhinitis with a nonallergic component 01/31/2017   Asthmatic bronchitis 01/31/2017   GI symptoms 01/31/2017    Family history of colon cancer 01/27/2015   Chest pain 12/13/2012   Dyspnea 12/13/2012   DVT of lower extremity (deep venous thrombosis) (HCC) 01/03/1990    PCP: Coral Else, MD  REFERRING PROVIDER: Lorenda Ishihara, MD  REFERRING DIAG: I89.0  THERAPY DIAG:  Lymphedema, not elsewhere classified  Rationale for Evaluation and Treatment: Rehabilitation  ONSET DATE: ~2014; exacerbation in 2023  SUBJECTIVE:                                                                                                                                                                                           SUBJECTIVE STATEMENT: Pt presents for OT to address lower quadrant and LLE lymphedema. Pt does not rate LE-related pain numerically this morning. Pt states she continues walking for exercise on a regular basis. Pt states region of R transverse colon is sore.  PERTINENT HISTORY: CVI, OSA (no CPAP), GERD, Esophageal stricture, IBS, Lactose intolerant, Diarrhea, Hx LE DVT  PAIN:  Are you having pain? Yes: NPRS scale: see SUBJECTIVE/10 Pain location: BLE, digestive discomfort, R flank pain Pain description: myalgia, tight, heavy, tingling, numbness Aggravating factors: standing, walking, dependent sitting > 30 minutes Relieving factors: elevation  PRECAUTIONS: Other: LYMPHEDEMA; Hx LLE DVT  RED FLAGS: Hx LLE DVT  WEIGHT BEARING RESTRICTIONS: No  FALLS:  Has patient fallen in last 6 months? No  LIVING ENVIRONMENT: Lives with: lives with spouse and daughter Lives in: House/apartment Stairs: Yes; Internal: 14 steps; yes and External: 4 steps; yes Has following equipment at home: None  OCCUPATION: [employer] operational officer, walking, desk work  LEISURE: family time  HAND DOMINANCE: right   PRIOR LEVEL OF FUNCTION: Independent  PATIENT GOALS: Feel better, be able to be more active without pain, wear preferred street shoes  OBJECTIVE: Note: Objective measures were completed at  Evaluation unless otherwise noted.  COGNITION:  Overall cognitive status: Within functional limits for tasks assessed   OBSERVATIONS / OTHER ASSESSMENTS:   POSTURE: WFL  LE ROM: WFL  LE MMT: WFL  LYMPHEDEMA ASSESSMENTS: non-Ca related  INFECTIONS:  denies cellulitis and wound hx  BLE COMPARATIVE LIMB VOLUMETRICS Initial 11/15/22  LANDMARK RIGHT (dominant)  R LEG (A-D) 3028.9 ml  R THIGH (E-G) 4809.60 ml  R FULL LIMB (A-G) 7838.5 ml  Limb Volume differential (LVD)  %  Volume change since initial %  Volume change overall V  (Blank rows = not tested)  LANDMARK LEFT  (Rx)  L LEG (A-D) 3189.9 ml  L THIGH (E-G) 4959.8 ml  L FULL LIMB (A-G) 8149.7 ml  Limb Volume differential (LVD)  LEG LVD = 5.32%, L>R; THIGH LVD = 3.12%, L>R; And full LLE LVD = 3.97%, L>R. 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 Obesity  Skin  Description Hyper-Keratosis Peau d' Orange Shiny Tight Fibrotic/ Indurated Fatty Doughy Spongy/ boggy       R>L x  x   Skin dry Flaky WNL Macerated   mildly      Color Redness Varicosities Blanching Hemosiderin Stain Mottled        x   Odor Malodorous Yeast Fungal infection  WNL      x   Temperature Warm Cool wnl     x    Pitting Edema   1+ 2+ 3+ 4+ Non-pitting         x   Girth Symmetrical Asymmetrical                   Distribution    L>R toes to groin    Stemmer Sign Positive Negative   +L -R   Lymphorrhea History Of:  Present Absent     x    Wounds History Of Present Absent Venous Arterial Pressure Sheer     x        Signs of Infection Redness Warmth Erythema Acute Swelling Drainage Borders                    Sensation Light Touch Deep pressure Hypersensitivity   In tact Impaired In tact Impaired Absent Impaired   x  x  x     Nails WNL   Fungus nail dystrophy   x     Hair Growth Symmetrical Asymmetrical   x     Skin Creases Base of toes  Ankles   Base of Fingers knees       Abdominal pannus Thigh Lobules  Face/neck   x           GAIT: Distance walked: >500' Assistive device utilized: None Level of assistance: Complete Independence Comments: Functional ambulation and transfers [facility]  LYMPHEDEMA LIFE IMPACT SCALE (LLIS): Initial 11/15/22  50%  FOTO functional outcome measure: Deferred. FOTO discontinued.  TODAY'S TREATMENT:  MLD to BLQ emphasis on colon and LLE Pt edu for le SELF CARE  PATIENT EDUCATION:  Continued Pt/ CG edu for how function of cisterna chyli, part of the thoracic duct of deep abdominal lymphatics, assists with digestion by filtering many of the fats and proteins from the digestive system. Sub optimal lymphatic flow in this region may result in constipation, bloating, swelling, etc. MLD helps mobilize these protein and fats , and the rhythmic movement of manual lymphatic drainage stimulates digestive muscles and increase peristalsis. Provided printed resource for reference.  Topics include outcome of comparative limb volumetrics- starting limb volume differentials (LVDs), technology and gradient techniques used for short stretch, multilayer compression wrapping, simple self-MLD, therapeutic lymphatic pumping exercises, skin/nail care, LE precautions,. compression garment recommendations and specifications, wear and care schedule and compression garment donning / doffing w assistive devices. Discussed progress towards all OT goals since commencing CDT. All questions answered to the Pt's satisfaction. Good return. Person educated: Patient  Education method: Explanation, Demonstration, and Handouts Education comprehension: verbalized understanding, returned demonstration, verbal cues required, and needs further education  HOME EXERCISE  PROGRAM: BLE lymphatic pumping there ex using- 1 sets of 10 reps, each exercise in order-  1-2 x daily, bilaterally Simple self MLD 1 x daily Daily skin care to increase hydration, skin mobility and decrease infection risk- can be done during MLD Compression wraps 23/7 during intensive phase of CDT Compression garments/ devices during self-management phase of CDT  ASSESSMENT:  CLINICAL IMPRESSION:  Pt tolerated abdominal MLD and MLD to LLE/LLQ without increased pain.  Performed J strokes to descending, transverse and ascending colon region , then utilized deep abdominal lymphatic pathways, including alternate pressure and release on descending, then repeated transverse, then ascending colon and diaphragmatic breathing. Pt  continues to benefit from skilled OT for Lymphedema care to reduce pain and digestive discomfort. Cont as per POC.  11/15/22 Initial Evaluation: Tiffny Gemmer is a 53 y.o. female presenting with very mild, stage II, LLE lymphedema 2/2 venous insufficiency and obesity. L Leg swelling and associated pain swelling fluctuates. It has progressed over time, and now no longer resolves with elevation over night. RLE swelling limits balance during functional ambulation. It exacerbates infection and falls risk. LLE/RLQ lymphedema interferes with functional performance in all occupational domains, including basic and instrumental ADLs, productive activities, leisure pursuits and social participation. Pt will benefit from Occupational Therapy for Complete Decongestive Therapy (CDT) to restore function, to reduce physical and psychologic suffering associated with chronic, progressive lymphedema and associated pain, and to limit infection. CDT will include manual lymphatic drainage (MLD), skin care, therapeutic exercise and compression wraps and garment's devices. Without skilled OT for lymphedema care the condition will worsen and further functional decline is expected  Custom-made gradient  compression garments and HOS devices are medically necessary because they are uniquely sized and shaped to fit the exact dimensions of the affected extremities, and to provide appropriate medical grade, graduated compression essential for optimally managing chronic, progressive lymphedema. Multiple custom compression garments are needed to ensure proper hygiene to limit infection risk. Custom compression garments should be replaced q 3-6 months When worn consistently for optimal lipo-lymphedema self-management over time. HOS devices, medically necessary to limit fibrosis buildup in tissue, should be replaced q 2 years and PRN when worn out.      OBJECTIVE IMPAIRMENTS: decreased activity tolerance, decreased knowledge of condition, decreased knowledge of use of DME, increased edema, impaired sensation, pain, and chronic, progressive leg swelling .   ACTIVITY LIMITATIONS: dependent  sitting, extended standing and/ or walking, squatting, and lower body dressing, fitting preferred street shoes  PARTICIPATION LIMITATIONS: shopping, community activity, occupation, and yard work  PERSONAL FACTORS: 3+ comorbidities: Hx DVT,  OSA, CVI. Varicose veins  are also affecting patient's functional outcome.   REHAB POTENTIAL: Good  EVALUATION COMPLEXITY: Moderate   GOALS: Goals reviewed with patient? Yes  SHORT TERM GOALS: Target date: 4th OT Rx visit  SHORT TERM GOALS: Target date: 4th OT Rx visit   Pt will demonstrate understanding of lymphedema precautions and prevention strategies with modified independence using a printed reference to identify at least 5 precautions and discussing how s/he may implement them into daily life to reduce risk of progression with extra time. Baseline:Max A Goal status: GOAL MET   Pt will be able to apply multilayer, knee length, gradient, compression wraps to one leg at a time with modified assistance (extra time and assistive device/s) to decrease limb volume, to limit  infection risk, and to limit lymphedema progression.  Baseline: Dependent Goal status: GOAL DISCHARGED. Pt Going to compression garments instead  LONG TERM GOALS: Target date: 02/08/23  Given this patient's Intake score of 67%/100% on the functional outcomes FOTO tool, patient will experience an increase in function of 5 points to improve basic and instrumental ADLs performance, including lymphedema self-care.  Baseline: Max A Goal status:GOAL DISCHARGED.  FOTO TOOL DISCONTINUED AT CLINIC   Given this patient's Intake score of 50% on the Lymphedema Life Impact Scale (LLIS), patient will experience a reduction of at least 5 points in her perceived level of functional impairment resulting from lymphedema to improve functional performance and quality of life (QOL). Baseline: % Goal status: PROGRESSING  Pt will achieve at least a 10% volume reduction in B legs to return limb to typical size and shape, to limit infection risk and LE progression, to decrease pain, to improve function. Baseline: Dependent Goal status: PROGRESSING  4.  Pt will obtain proper compression garments/devices and achieve modified independence (extra time + assistive devices) with donning/doffing to optimize limb volume reductions and limit LE  progression over time. Baseline:  Goal status: ONGOING. Garments  5.  During Intensive phase CDT , with modified independence, Pt will achieve at least 85% compliance with all lymphedema self-care home program components, including daily skin care, compression wraps and /or garments, simple self MLD and lymphatic pumping therex to habituate LE self care protocol  into ADLs for optimal LE self-management over time. Baseline: Dependent Goal status: PROGRESSING   PLAN:  OT FREQUENCY: 2x/week  OT DURATION: 12 weeks  PLANNED INTERVENTIONS: 97110-Therapeutic exercises, 97530- Therapeutic activity, 97535- Self Care, 29562- Manual therapy, Patient/Family education, Manual lymph  drainage, Compression bandaging, and fit with appropriate compression garments  PLAN FOR NEXT SESSION:  Cont MLD Cont Pt edu for LE self-care  Loel Dubonnet, MS, OTR/L, CLT-LANA 03/10/23 12:29 PM '

## 2023-03-14 ENCOUNTER — Encounter: Payer: Self-pay | Admitting: Occupational Therapy

## 2023-03-14 ENCOUNTER — Encounter: Payer: Self-pay | Admitting: Hematology & Oncology

## 2023-03-14 ENCOUNTER — Ambulatory Visit: Payer: Self-pay | Admitting: Occupational Therapy

## 2023-03-14 DIAGNOSIS — I890 Lymphedema, not elsewhere classified: Secondary | ICD-10-CM | POA: Diagnosis not present
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
## 2023-03-16 ENCOUNTER — Ambulatory Visit: Payer: Self-pay | Attending: Obstetrics & Gynecology

## 2023-03-16 ENCOUNTER — Encounter: Payer: Self-pay | Admitting: Hematology & Oncology

## 2023-03-16 DIAGNOSIS — M5459 Other low back pain: Secondary | ICD-10-CM | POA: Insufficient documentation

## 2023-03-16 DIAGNOSIS — R102 Pelvic and perineal pain: Secondary | ICD-10-CM | POA: Diagnosis not present

## 2023-03-16 DIAGNOSIS — R279 Unspecified lack of coordination: Secondary | ICD-10-CM | POA: Diagnosis not present

## 2023-03-16 DIAGNOSIS — M6281 Muscle weakness (generalized): Secondary | ICD-10-CM | POA: Diagnosis not present

## 2023-03-16 DIAGNOSIS — M62838 Other muscle spasm: Secondary | ICD-10-CM | POA: Diagnosis not present

## 2023-03-16 NOTE — Therapy (Signed)
 OUTPATIENT PHYSICAL THERAPY FEMALE PELVIC TREATMENT   Patient Name: Karen [name] MRN: [id number] DOB:[birth year], 53 y.o., female Today's Date: 03/16/2023  END OF SESSION:  PT End of Session - 03/16/23 1523     Visit Number 32    Date for PT Re-Evaluation 04/26/23    Authorization Type [employer]    PT Start Time 1530    PT Stop Time 1610    PT Time Calculation (min) 40 min    Activity Tolerance Patient tolerated treatment well    Behavior During Therapy [facility] for tasks assessed/performed                     Past Medical History:  Diagnosis Date   Adenomyosis    Anemia    Arthritis 2013   L knee gets steroid injections    Asthmatic bronchitis 01/31/2017   Back pain    Chronic constipation    Diarrhea    DVT of lower extremity (deep venous thrombosis) (HCC)    Dysmenorrhea    Endometriosis    Esophageal stricture    GERD (gastroesophageal reflux disease)    History of kidney stones    History of uterine fibroid    IBS (irritable bowel syndrome)    Interstitial cystitis    Lactose intolerance    Migraine    with aura   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   PONV (postoperative nausea and vomiting)    Recurrent upper respiratory infection (URI)    Sebaceous cyst of breast, right lower inner quadrant 10/25/2012   Excised 11/21/12    Sleep apnea    Syncope, non cardiac    Past Surgical History:  Procedure Laterality Date   ARTHROSCOPIC HAGLUNDS REPAIR     BREAST CYST EXCISION Right 11/21/2012   Procedure: CYST EXCISION BREAST;  Surgeon: Currie Paris, MD;  Location: [facility];  Service: General;  Laterality: Right;   BREAST CYST EXCISION Bilateral 01/18/2022   Procedure: EXCISION OF SEBACEOUS CYST BILATERAL BREAST;  Surgeon: Harriette Bouillon, MD;  Location: [facility];  Service: General;  Laterality: Bilateral;   BREAST EXCISIONAL BIOPSY Right 11/2012   CESAREAN SECTION  04/19/1993   CHOLECYSTECTOMY      COLONOSCOPY  05/02/2011   Procedure: COLONOSCOPY;  Surgeon: Vertell Novak., MD;  Location: [facility];  Service: Endoscopy;  Laterality: N/A;   colonoscopy  11/04/2014   DENTAL SURGERY  03/06/2018   2 surgeries ( 03/06/2018 and 04/06/2018)   to remove two separate benign tumors   ESOPHAGEAL MANOMETRY N/A 11/04/2015   Procedure: ESOPHAGEAL MANOMETRY (EM);  Surgeon: Carman Ching, MD;  Location: [facility];  Service: Endoscopy;  Laterality: N/A;   ESOPHAGOGASTRODUODENOSCOPY  05/02/2011   Procedure: ESOPHAGOGASTRODUODENOSCOPY (EGD);  Surgeon: Vertell Novak., MD;  Location: [facility];  Service: Endoscopy;  Laterality: N/A;   ESOPHAGOGASTRODUODENOSCOPY N/A 09/01/2014   Procedure: ESOPHAGOGASTRODUODENOSCOPY (EGD);  Surgeon: Carman Ching, MD;  Location: [facility];  Service: Endoscopy;  Laterality: N/A;   KNEE SURGERY     LAPAROSCOPY     x 4   LESION REMOVAL N/A 01/18/2022   Procedure: EXCISION OF SEBACEOUS CYSTS CHEST AND NECK;  Surgeon: Harriette Bouillon, MD;  Location: [facility];  Service: General;  Laterality: N/A;   PH IMPEDANCE STUDY N/A 11/04/2015   Procedure: PH IMPEDANCE STUDY;  Surgeon: Carman Ching, MD;  Location: [facility];  Service: Endoscopy;  Laterality: N/A;   VAGINAL HYSTERECTOMY  2010   TVH--ovaries remain  Patient Active Problem List   Diagnosis Date Noted   Sjogren syndrome with keratoconjunctivitis (HCC) 12/09/2022   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   Elevated LFTs 04/04/2021   Colitis 04/04/2021   Bacterial overgrowth syndrome 05/21/2020   Obesity 05/21/2020   History of endometriosis 05/21/2020   Constipation due to outlet dysfunction 02/05/2020   Gastroesophageal reflux disease 02/05/2020   Obstructive sleep apnea treated with continuous positive airway pressure (CPAP) 06/16/2017   Perennial allergic rhinitis with a nonallergic component 01/31/2017   Asthmatic bronchitis 01/31/2017   GI symptoms 01/31/2017   Family history of  colon cancer 01/27/2015   Chest pain 12/13/2012   Dyspnea 12/13/2012   DVT of lower extremity (deep venous thrombosis) (HCC) 01/03/1990    PCP: Lorenda Ishihara, MD  REFERRING PROVIDER: Jerene Bears, MD   REFERRING DIAG: M62.89 (ICD-10-CM) - Pelvic floor dysfunction  THERAPY DIAG:  Pelvic pain  Other low back pain  Muscle weakness (generalized)  Other muscle spasm  Unspecified lack of coordination  Rationale for Evaluation and Treatment: Rehabilitation  ONSET DATE: chronic  SUBJECTIVE:                                                                                                                                                                                           SUBJECTIVE STATEMENT:  Pt has been to see vulvar specialist and states that her skin irritation is looking consistent with autoimmune disorder. She is waiting to hear from GI specialist on continuing work up for Crohn's and ulcerative colitis. She did not end up doing cleanse after her last visit due to improvement in bowel movements.    PAIN: 03/16/23 Are you having pain? Yes NPRS scale: 2/10 Pain location:  Lower abdominal pain, Lt sided pain, low back pain  Pain type: turning, knotted, pressure Pain description: intermittent and constant   Aggravating factors: constipation or frequent bowel movements/constantly having bowel movements  Relieving factors: exercises (bowel massage, happy baby, spinal twists, walking)  PRECAUTIONS: None  WEIGHT BEARING RESTRICTIONS: No  FALLS:  Has patient fallen in last 6 months? No  LIVING ENVIRONMENT: Lives with: lives with their spouse Lives in: House/apartment  OCCUPATION: nurse, in admin  PLOF: Independent  PATIENT GOALS: decrease pain and have more normal bowel movements  PERTINENT HISTORY:  Vaginal hysterectomy, DVT LE, endometriosis, interstitial cystitis, exploratory laps for endometriosis, c-section, cholecystectomy Sexual abuse:  No  BOWEL MOVEMENT: Pain with bowel movement: Yes Type of bowel movement:Type (Bristol Stool Scale) 1-7 (sometimes full range in the same bowel movement), Frequency sometimes many times a day, sometimes up to 4 days in between, and Strain Yes Fully empty rectum: No  Leakage: No Pads: No Fiber supplement: No - has attempted metamucil and it made constipation worse  URINATION: Pain with urination: Yes Fully empty bladder: No Stream:  varies  Urgency: Yes: all the time Frequency: multiple times an hours  Leakage: Urge to void, Walking to the bathroom, Coughing, Sneezing, Laughing, and Exercise Pads: Yes: daily  INTERCOURSE: Pain with intercourse: Initial Penetration and During Penetration Ability to have vaginal penetration:  Yes: with pain Climax: non painful WNL   PREGNANCY: Vaginal deliveries 1 Tearing Yes: 3rd degree tear C-section deliveries 1 Currently pregnant No  PROLAPSE: Periodically will feel vaginal bulging, heaviness in lower abdomen   OBJECTIVE:  11/09/22: No internal rectal or vaginal pelvic floor exam performed due to high level of skin irritation   Weak transversus abdominus contraction  Abdominal restriction and tenderness in bil lower quadrant  Unable to perform pelvic tilt with appropriate coordination  LUMBARAROM/PROM:  A/PROM A/PROM  Eval (% available) 11/09/22 (% available)  Flexion 50 90  Extension 25, pain anteriorly  25, pain in low back  Right lateral flexion 50, pain anteriorly  75, pain on right  Left lateral flexion 50, pain anteriorly  75, pain on right   (Blank rows = not tested) 07/21/22: standing prolapse assessment demonstrated grade 2 anterior vaginal wall laxity   06/08/22:               External Perineal Exam: WNL                              Internal Pelvic Floor: burning reported with palpation of superficial muscles; deep aching/pressure with palpation of Lt levator ani   Patient confirms identification and approves PT to  assess internal pelvic floor and treatment Yes  PELVIC MMT:   MMT eval  Vaginal 3/5  Internal Anal Sphincter 1/5  External Anal Sphincter 2/5  Puborectalis 1/5  Diastasis Recti   (Blank rows = not tested)        TONE: High in Lt levator ani   PROLAPSE: Not able to tell this session due to dyssynergic pelvic floor contraction     06/01/22: COGNITION: Overall cognitive status: Within functional limits for tasks assessed     SENSATION: Light touch: Appears intact Proprioception: Appears intact  GAIT: Comments: decreased hip extension, forward flexed trunk  POSTURE: rounded shoulders, forward head, decreased lumbar lordosis, increased thoracic kyphosis, posterior pelvic tilt, and flexed trunk    LUMBARAROM/PROM:  A/PROM A/PROM  Eval (% available)  Flexion 50  Extension 25, pain anteriorly   Right lateral flexion 50, pain anteriorly   Left lateral flexion 50, pain anteriorly    (Blank rows = not tested)   PALPATION:   General  Significant abdominal restriction and tenderness; decreased rib mobility with mobilization and breathing    TODAY'S TREATMENT:  DATE:  03/16/23 Manual: Abdominal soft tissue mobilization  Bowel mobilization Ileocecal valve myofascial release Quadruped myofascial release to lower thoracic/lumbar paraspinals  Exercises: Seated pelvic tilts 2 x 10 Seated forward fold 10 breaths Seated piriformis stretch 60 seconds bil  03/02/23 Manual: Abdominal soft tissue mobilization  Bowel mobilization Ileocecal valve myofascial release Quadruped myofascial release to lower thoracic/lumbar paraspinals  Neuromuscular re-education: Supine posterior pelvic tilts on dynadisc 2 x 10 Supine march on dynadisc 2 x 10 Lower trunk rotation on dynadisc 2 x 10  02/21/23 Manual: Abdominal soft tissue mobilization  Bowel  mobilization Ileocecal valve myofascial release Neuromuscular re-education: Bridge with hip adduction, transversus abdominus, and pelvic floor muscle 2 x 10 Bridge with bil abduction, transversus abdominus, and pelvic floor muscle 2 x 10, red band  Supine resisted march red band 2 x 10 Supine leg extensions 10x bil   PATIENT EDUCATION:  Education details: See above Person educated: Patient Education method: Programmer, multimedia, Demonstration, Tactile cues, Verbal cues, and Handouts Education comprehension: verbalized understanding  HOME EXERCISE PROGRAM: [id number]  ASSESSMENT:  CLINICAL IMPRESSION: Pt has received a lot of information on different disease processes that she is dealing with and working on integrating information and continuing to work with healthcare team. She is seeing some progress with bowel movements and has been able to hold off on performing cleanse. Believe this is further helping to regulate bowel movements with less disruption of cleanses. She tolerated all manual techniques well today and feels as if they continue to be very helpful in managing constipation. She did well with mobility activities and is improving pelvic/core coordination. She will continue to benefit from skilled PT intervention in order to decrease pain, improve bowel movements, and decrease urinary urgency/incontinence.     OBJECTIVE IMPAIRMENTS: decreased activity tolerance, decreased coordination, decreased endurance, decreased mobility, decreased strength, increased fascial restrictions, increased muscle spasms, impaired tone, postural dysfunction, and pain.   ACTIVITY LIMITATIONS: lifting, bending, continence, and locomotion level  PARTICIPATION LIMITATIONS: interpersonal relationship, community activity, and occupation  PERSONAL FACTORS: 1 comorbidity: medical history  are also affecting patient's functional outcome.   REHAB POTENTIAL: Good  CLINICAL DECISION MAKING:  Stable/uncomplicated  EVALUATION COMPLEXITY: Low   GOALS: Goals reviewed with patient? Yes  SHORT TERM GOALS: Target date: 07/06/22 - updated 07/12/22 - updated 08/18/22 - updated 09/22/22 - updated 10/26/22 - updated 11/09/22 - updated 12/15/22 updated 01/18/23  Pt will be independent with HEP.   Baseline: Goal status: MET 07/12/22  2.  Pt will be independent with diaphragmatic breathing and down training activities in order to improve pelvic floor relaxation.  Baseline:  Goal status: MET 07/12/22  3.  Pt will be independent with the knack, urge suppression technique, and double voiding in order to improve bladder habits and decrease urinary incontinence.   Baseline:  Goal status: MET 09/22/22  4.  Pt will be independent with use of squatty potty, relaxed toileting mechanics, and improved bowel movement techniques in order to increase ease of bowel movements and complete evacuation.   Baseline:  Goal status: MET 11/09/22  5.  Pt will be able to correctly perform diaphragmatic breathing and appropriate pressure management in order to prevent worsening vaginal wall laxity and improve pelvic floor A/ROM.   Baseline:  Goal status: MET 01/18/23  LONG TERM GOALS: Target date: 11/16/2022 - updated 07/12/22 - updated 08/18/22 - updated 09/22/22 - updated 10/26/22 - updated 11/09/22 - updated 12/15/22 - updated 02/21/23  Pt will be independent with advanced HEP.   Baseline:  Goal status: IN PROGRESS 02/21/23  2.  Pt will demonstrate normal pelvic floor muscle tone and A/ROM, able to achieve 4/5 strength with contractions and 10 sec endurance, in order to provide appropriate lumbopelvic support in functional activities.   Baseline:  Goal status: IN PROGRESS 02/21/23  3.  Pt will increase all impaired lumbar A/ROM by 25% without pain.  Baseline:  Goal status:  IN PROGRESS 02/21/23  4.  Pt will report pain no higher than 3/10 with any activity. Baseline:  Goal status:  IN PROGRESS 02/21/23  5.   Pt will report 0/10 pain with vaginal penetration in order to improve intimate relationship with partner.    Baseline:  Goal status:  IN PROGRESS 02/21/23  6.  Pt will be able to go 2-3 hours in between voids without urgency or incontinence in order to improve QOL and perform all functional activities with less difficulty.   Baseline:  Goal status: MET 02/21/23  7.  Pt will report no leaks with laughing, coughing, sneezing in order to improve comfort with interpersonal relationships and community activities.   Baseline:  Goal status:  IN PROGRESS 02/21/23  8.  Pt will have consistent bowel movements 4-5x/week without straining or pain.  Baseline:  Goal status:  MET 02/21/23  PLAN:  PT FREQUENCY: 1-2x/week  PT DURATION: 6 months  PLANNED INTERVENTIONS: Therapeutic exercises, Therapeutic activity, Neuromuscular re-education, Balance training, Gait training, Patient/Family education, Self Care, Joint mobilization, Dry Needling, Biofeedback, and Manual therapy  PLAN FOR NEXT SESSION: Continue to work on motor control activities surrounding pelvis; progress mobility; manual techniques as needed for comfort and to increase circulation.   Julio Alm, PT, DPT03/13/254:12 PM

## 2023-03-17 ENCOUNTER — Ambulatory Visit: Payer: Self-pay | Admitting: Occupational Therapy

## 2023-03-21 ENCOUNTER — Ambulatory Visit: Payer: Self-pay | Admitting: Occupational Therapy

## 2023-03-22 ENCOUNTER — Encounter: Payer: Self-pay | Admitting: Hematology & Oncology

## 2023-03-23 ENCOUNTER — Encounter: Payer: Self-pay | Admitting: Occupational Therapy

## 2023-03-23 ENCOUNTER — Ambulatory Visit: Payer: Self-pay

## 2023-03-23 ENCOUNTER — Ambulatory Visit: Payer: Self-pay | Admitting: Occupational Therapy

## 2023-03-23 DIAGNOSIS — R102 Pelvic and perineal pain: Secondary | ICD-10-CM | POA: Diagnosis not present

## 2023-03-23 DIAGNOSIS — M5459 Other low back pain: Secondary | ICD-10-CM

## 2023-03-23 DIAGNOSIS — M6281 Muscle weakness (generalized): Secondary | ICD-10-CM

## 2023-03-23 DIAGNOSIS — I890 Lymphedema, not elsewhere classified: Secondary | ICD-10-CM

## 2023-03-23 DIAGNOSIS — R279 Unspecified lack of coordination: Secondary | ICD-10-CM | POA: Diagnosis not present

## 2023-03-23 DIAGNOSIS — M62838 Other muscle spasm: Secondary | ICD-10-CM

## 2023-03-23 NOTE — Therapy (Signed)
 OUTPATIENT PHYSICAL THERAPY FEMALE PELVIC TREATMENT   Patient Name: Karen [name] MRN: [id number] DOB:September 22, 1970, 53 y.o., female Today's Date: 03/23/2023  END OF SESSION:  PT End of Session - 03/23/23 1450     Visit Number 33    Date for PT Re-Evaluation 04/26/23    Authorization Type [employer]    PT Start Time 1445    PT Stop Time 1525    PT Time Calculation (min) 40 min    Activity Tolerance Patient tolerated treatment well    Behavior During Therapy [facility] for tasks assessed/performed                      Past Medical History:  Diagnosis Date   Adenomyosis    Anemia    Arthritis 2013   L knee gets steroid injections    Asthmatic bronchitis 01/31/2017   Back pain    Chronic constipation    Diarrhea    DVT of lower extremity (deep venous thrombosis) (HCC)    Dysmenorrhea    Endometriosis    Esophageal stricture    GERD (gastroesophageal reflux disease)    History of kidney stones    History of uterine fibroid    IBS (irritable bowel syndrome)    Interstitial cystitis    Lactose intolerance    Migraine    with aura   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   PONV (postoperative nausea and vomiting)    Recurrent upper respiratory infection (URI)    Sebaceous cyst of breast, right lower inner quadrant 10/25/2012   Excised 11/21/12    Sleep apnea    Syncope, non cardiac    Past Surgical History:  Procedure Laterality Date   ARTHROSCOPIC HAGLUNDS REPAIR     BREAST CYST EXCISION Right 11/21/2012   Procedure: CYST EXCISION BREAST;  Surgeon: Currie Paris, MD;  Location: [facility];  Service: General;  Laterality: Right;   BREAST CYST EXCISION Bilateral 01/18/2022   Procedure: EXCISION OF SEBACEOUS CYST BILATERAL BREAST;  Surgeon: Harriette Bouillon, MD;  Location: [facility];  Service: General;  Laterality: Bilateral;   BREAST EXCISIONAL BIOPSY Right 11/2012   CESAREAN SECTION  04/19/1993   CHOLECYSTECTOMY      COLONOSCOPY  05/02/2011   Procedure: COLONOSCOPY;  Surgeon: Vertell Novak., MD;  Location: [facility];  Service: Endoscopy;  Laterality: N/A;   colonoscopy  11/04/2014   DENTAL SURGERY  03/06/2018   2 surgeries ( 03/06/2018 and 04/06/2018)   to remove two separate benign tumors   ESOPHAGEAL MANOMETRY N/A 11/04/2015   Procedure: ESOPHAGEAL MANOMETRY (EM);  Surgeon: Carman Ching, MD;  Location: [facility];  Service: Endoscopy;  Laterality: N/A;   ESOPHAGOGASTRODUODENOSCOPY  05/02/2011   Procedure: ESOPHAGOGASTRODUODENOSCOPY (EGD);  Surgeon: Vertell Novak., MD;  Location: [facility];  Service: Endoscopy;  Laterality: N/A;   ESOPHAGOGASTRODUODENOSCOPY N/A 09/01/2014   Procedure: ESOPHAGOGASTRODUODENOSCOPY (EGD);  Surgeon: Carman Ching, MD;  Location: [facility];  Service: Endoscopy;  Laterality: N/A;   KNEE SURGERY     LAPAROSCOPY     x 4   LESION REMOVAL N/A 01/18/2022   Procedure: EXCISION OF SEBACEOUS CYSTS CHEST AND NECK;  Surgeon: Harriette Bouillon, MD;  Location: [facility];  Service: General;  Laterality: N/A;   PH IMPEDANCE STUDY N/A 11/04/2015   Procedure: PH IMPEDANCE STUDY;  Surgeon: Carman Ching, MD;  Location: [facility];  Service: Endoscopy;  Laterality: N/A;   VAGINAL HYSTERECTOMY  2010   TVH--ovaries remain  Patient Active Problem List   Diagnosis Date Noted   Sjogren syndrome with keratoconjunctivitis (HCC) 12/09/2022   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   Elevated LFTs 04/04/2021   Colitis 04/04/2021   Bacterial overgrowth syndrome 05/21/2020   Obesity 05/21/2020   History of endometriosis 05/21/2020   Constipation due to outlet dysfunction 02/05/2020   Gastroesophageal reflux disease 02/05/2020   Obstructive sleep apnea treated with continuous positive airway pressure (CPAP) 06/16/2017   Perennial allergic rhinitis with a nonallergic component 01/31/2017   Asthmatic bronchitis 01/31/2017   GI symptoms 01/31/2017   Family history of  colon cancer 01/27/2015   Chest pain 12/13/2012   Dyspnea 12/13/2012   DVT of lower extremity (deep venous thrombosis) (HCC) 01/03/1990    PCP: Lorenda Ishihara, MD  REFERRING PROVIDER: Jerene Bears, MD   REFERRING DIAG: M62.89 (ICD-10-CM) - Pelvic floor dysfunction  THERAPY DIAG:  Pelvic pain  Other low back pain  Muscle weakness (generalized)  Other muscle spasm  Unspecified lack of coordination  Rationale for Evaluation and Treatment: Rehabilitation  ONSET DATE: chronic  SUBJECTIVE:                                                                                                                                                                                           SUBJECTIVE STATEMENT:  Pt has been having diarrhea. She has been able to hold off on doing cleanse again. She states that she had a difficult weekend with pain and headache.    PAIN: 03/23/23 Are you having pain? Yes NPRS scale: 4/10 Pain location:  Lower abdominal pain, Lt sided pain, low back pain  Pain type: turning, knotted, pressure Pain description: intermittent and constant   Aggravating factors: constipation or frequent bowel movements/constantly having bowel movements  Relieving factors: exercises (bowel massage, happy baby, spinal twists, walking)  PRECAUTIONS: None  WEIGHT BEARING RESTRICTIONS: No  FALLS:  Has patient fallen in last 6 months? No  LIVING ENVIRONMENT: Lives with: lives with their spouse Lives in: House/apartment  OCCUPATION: nurse, in admin  PLOF: Independent  PATIENT GOALS: decrease pain and have more normal bowel movements  PERTINENT HISTORY:  Vaginal hysterectomy, DVT LE, endometriosis, interstitial cystitis, exploratory laps for endometriosis, c-section, cholecystectomy Sexual abuse: No  BOWEL MOVEMENT: Pain with bowel movement: Yes Type of bowel movement:Type (Bristol Stool Scale) 1-7 (sometimes full range in the same bowel movement), Frequency  sometimes many times a day, sometimes up to 4 days in between, and Strain Yes Fully empty rectum: No Leakage: No Pads: No Fiber supplement: No - has attempted metamucil and it made constipation worse  URINATION: Pain with urination: Yes Fully empty bladder:  No Stream:  varies  Urgency: Yes: all the time Frequency: multiple times an hours  Leakage: Urge to void, Walking to the bathroom, Coughing, Sneezing, Laughing, and Exercise Pads: Yes: daily  INTERCOURSE: Pain with intercourse: Initial Penetration and During Penetration Ability to have vaginal penetration:  Yes: with pain Climax: non painful WNL   PREGNANCY: Vaginal deliveries 1 Tearing Yes: 3rd degree tear C-section deliveries 1 Currently pregnant No  PROLAPSE: Periodically will feel vaginal bulging, heaviness in lower abdomen   OBJECTIVE:  11/09/22: No internal rectal or vaginal pelvic floor exam performed due to high level of skin irritation   Weak transversus abdominus contraction  Abdominal restriction and tenderness in bil lower quadrant  Unable to perform pelvic tilt with appropriate coordination  LUMBARAROM/PROM:  A/PROM A/PROM  Eval (% available) 11/09/22 (% available)  Flexion 50 90  Extension 25, pain anteriorly  25, pain in low back  Right lateral flexion 50, pain anteriorly  75, pain on right  Left lateral flexion 50, pain anteriorly  75, pain on right   (Blank rows = not tested) 07/21/22: standing prolapse assessment demonstrated grade 2 anterior vaginal wall laxity   06/08/22:               External Perineal Exam: WNL                              Internal Pelvic Floor: burning reported with palpation of superficial muscles; deep aching/pressure with palpation of Lt levator ani   Patient confirms identification and approves PT to assess internal pelvic floor and treatment Yes  PELVIC MMT:   MMT eval  Vaginal 3/5  Internal Anal Sphincter 1/5  External Anal Sphincter 2/5  Puborectalis 1/5   Diastasis Recti   (Blank rows = not tested)        TONE: High in Lt levator ani   PROLAPSE: Not able to tell this session due to dyssynergic pelvic floor contraction     06/01/22: COGNITION: Overall cognitive status: Within functional limits for tasks assessed     SENSATION: Light touch: Appears intact Proprioception: Appears intact  GAIT: Comments: decreased hip extension, forward flexed trunk  POSTURE: rounded shoulders, forward head, decreased lumbar lordosis, increased thoracic kyphosis, posterior pelvic tilt, and flexed trunk    LUMBARAROM/PROM:  A/PROM A/PROM  Eval (% available)  Flexion 50  Extension 25, pain anteriorly   Right lateral flexion 50, pain anteriorly   Left lateral flexion 50, pain anteriorly    (Blank rows = not tested)   PALPATION:   General  Significant abdominal restriction and tenderness; decreased rib mobility with mobilization and breathing    TODAY'S TREATMENT:                                                                                                                              DATE:  03/23/23 Manual: Abdominal soft tissue mobilization  Bowel mobilization  Ileocecal valve myofascial release Quadruped myofascial release to lower thoracic/lumbar paraspinals  Prone lower thoracic Grade 2-3 mobilizations Prone soft tissue mobilization to lumbar paraspinals  Neuromuscular re-education: Vibration plate 10 min Heel raises Deep squat  Bil hip circles   03/16/23 Manual: Abdominal soft tissue mobilization  Bowel mobilization Ileocecal valve myofascial release Quadruped myofascial release to lower thoracic/lumbar paraspinals  Exercises: Seated pelvic tilts 2 x 10 Seated forward fold 10 breaths Seated piriformis stretch 60 seconds bil  03/02/23 Manual: Abdominal soft tissue mobilization  Bowel mobilization Ileocecal valve myofascial release Quadruped myofascial release to lower thoracic/lumbar paraspinals  Neuromuscular  re-education: Supine posterior pelvic tilts on dynadisc 2 x 10 Supine march on dynadisc 2 x 10 Lower trunk rotation on dynadisc 2 x 10   PATIENT EDUCATION:  Education details: See above Person educated: Patient Education method: Programmer, multimedia, Demonstration, Tactile cues, Verbal cues, and Handouts Education comprehension: verbalized understanding  HOME EXERCISE PROGRAM: [id number]  ASSESSMENT:  CLINICAL IMPRESSION: Pt doing better sinc ethe weekend in which she had a lot of low back pain and more constipation. We attempted vibration plate activities for the first time today in order to help improve fluid movement/circulation to see if it has any impact on overall condition. Good tolerance to all manual techniques with improved low back pain throughout session. She will continue to benefit from skilled PT intervention in order to decrease pain, improve bowel movements, and decrease urinary urgency/incontinence.     OBJECTIVE IMPAIRMENTS: decreased activity tolerance, decreased coordination, decreased endurance, decreased mobility, decreased strength, increased fascial restrictions, increased muscle spasms, impaired tone, postural dysfunction, and pain.   ACTIVITY LIMITATIONS: lifting, bending, continence, and locomotion level  PARTICIPATION LIMITATIONS: interpersonal relationship, community activity, and occupation  PERSONAL FACTORS: 1 comorbidity: medical history  are also affecting patient's functional outcome.   REHAB POTENTIAL: Good  CLINICAL DECISION MAKING: Stable/uncomplicated  EVALUATION COMPLEXITY: Low   GOALS: Goals reviewed with patient? Yes  SHORT TERM GOALS: Target date: 07/06/22 - updated 07/12/22 - updated 08/18/22 - updated 09/22/22 - updated 10/26/22 - updated 11/09/22 - updated 12/15/22 updated 01/18/23  Pt will be independent with HEP.   Baseline: Goal status: MET 07/12/22  2.  Pt will be independent with diaphragmatic breathing and down training activities in order  to improve pelvic floor relaxation.  Baseline:  Goal status: MET 07/12/22  3.  Pt will be independent with the knack, urge suppression technique, and double voiding in order to improve bladder habits and decrease urinary incontinence.   Baseline:  Goal status: MET 09/22/22  4.  Pt will be independent with use of squatty potty, relaxed toileting mechanics, and improved bowel movement techniques in order to increase ease of bowel movements and complete evacuation.   Baseline:  Goal status: MET 11/09/22  5.  Pt will be able to correctly perform diaphragmatic breathing and appropriate pressure management in order to prevent worsening vaginal wall laxity and improve pelvic floor A/ROM.   Baseline:  Goal status: MET 01/18/23  LONG TERM GOALS: Target date: 11/16/2022 - updated 07/12/22 - updated 08/18/22 - updated 09/22/22 - updated 10/26/22 - updated 11/09/22 - updated 12/15/22 - updated 02/21/23 - updated 03/23/23  Pt will be independent with advanced HEP.   Baseline:  Goal status: IN PROGRESS 03/23/23  2.  Pt will demonstrate normal pelvic floor muscle tone and A/ROM, able to achieve 4/5 strength with contractions and 10 sec endurance, in order to provide appropriate lumbopelvic support in functional activities.   Baseline:  Goal status: IN PROGRESS  03/23/23  3.  Pt will increase all impaired lumbar A/ROM by 25% without pain.  Baseline:  Goal status:  IN PROGRESS 03/23/23  4.  Pt will report pain no higher than 3/10 with any activity. Baseline:  Goal status:  IN PROGRESS 03/23/23  5.  Pt will report 0/10 pain with vaginal penetration in order to improve intimate relationship with partner.    Baseline:  Goal status:  IN PROGRESS 03/23/23  6.  Pt will be able to go 2-3 hours in between voids without urgency or incontinence in order to improve QOL and perform all functional activities with less difficulty.   Baseline:  Goal status: MET 02/21/23  7.  Pt will report no leaks with laughing,  coughing, sneezing in order to improve comfort with interpersonal relationships and community activities.   Baseline:  Goal status:  IN PROGRESS 03/23/23  8.  Pt will have consistent bowel movements 4-5x/week without straining or pain.  Baseline:  Goal status:  MET 02/21/23  PLAN:  PT FREQUENCY: 1-2x/week  PT DURATION: 6 months  PLANNED INTERVENTIONS: Therapeutic exercises, Therapeutic activity, Neuromuscular re-education, Balance training, Gait training, Patient/Family education, Self Care, Joint mobilization, Dry Needling, Biofeedback, and Manual therapy  PLAN FOR NEXT SESSION: Continue to work on motor control activities surrounding pelvis; progress mobility; manual techniques as needed for comfort and to increase circulation.   Julio Alm, PT, DPT03/20/252:50 PM
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
## 2023-03-24 ENCOUNTER — Ambulatory Visit (INDEPENDENT_AMBULATORY_CARE_PROVIDER_SITE_OTHER): Payer: Self-pay | Admitting: Obstetrics & Gynecology

## 2023-03-24 ENCOUNTER — Encounter (HOSPITAL_BASED_OUTPATIENT_CLINIC_OR_DEPARTMENT_OTHER): Payer: Self-pay | Admitting: Obstetrics & Gynecology

## 2023-03-24 VITALS — BP 129/84 | HR 88 | Ht 67.0 in | Wt 204.4 lb

## 2023-03-24 DIAGNOSIS — Z90710 Acquired absence of both cervix and uterus: Secondary | ICD-10-CM | POA: Diagnosis not present

## 2023-03-24 DIAGNOSIS — I82492 Acute embolism and thrombosis of other specified deep vein of left lower extremity: Secondary | ICD-10-CM

## 2023-03-24 DIAGNOSIS — Z01419 Encounter for gynecological examination (general) (routine) without abnormal findings: Secondary | ICD-10-CM

## 2023-03-24 DIAGNOSIS — N9089 Other specified noninflammatory disorders of vulva and perineum: Secondary | ICD-10-CM | POA: Diagnosis not present

## 2023-03-24 DIAGNOSIS — Z800 Family history of malignant neoplasm of digestive organs: Secondary | ICD-10-CM

## 2023-03-24 DIAGNOSIS — Z8742 Personal history of other diseases of the female genital tract: Secondary | ICD-10-CM

## 2023-03-24 NOTE — Progress Notes (Signed)
 ANNUAL EXAM Patient name: Karen [name] MRN [id number]  Date of birth: [birth year] Chief Complaint:   Annual Exam  History of Present Illness:   Karen [name] is a 53 y.o. G2P2 African-American female being seen today for a routine annual exam.  Has been having vulvar burning for an extended period of time.  She has seen vulvar specialist, Dr. Delanna Ahmadi at [facility], and Dr. Onalee Hua with [facility].  Desitin and hydrocortisone/nystatin with water wipes only, fragrance free products only seems to have all worked.  Dr. Clyde Canterbury suggestion was triamcinolone daily and no wipes.  So, right now she is just using the triamcinolone only.  Does have follow up with both of them.    She's stopped the nifedipine and the lidocaine that she was using for fissures.    Continues to have GI issues but did have genetic testing that showed two pathologist findings.  Dietary changes recommended and she has done some dietary modifications.    No LMP recorded. Patient has had a hysterectomy.  Last pap not indicated.  Last mammogram: 12/22/2022. Results were: abnormal but follow up showed an inclusion cyst . Family h/o breast cancer: no Last colonoscopy: 11/25/2019. Results were: normal. Family h/o colorectal cancer: yes in mother and father     03/24/2023   10:18 AM 12/23/2022    9:57 AM 10/05/2022    3:37 PM 08/22/2022   10:37 AM 03/04/2022    9:47 AM  Depression screen PHQ 2/9  Decreased Interest 0 0 0 0 0  Down, Depressed, Hopeless 0 0 0 0 0  PHQ - 2 Score 0 0 0 0 0     Review of Systems:   Pertinent items are noted in HPI  Denies any vaginal bleeding.  No new breast issues.   Pertinent History Reviewed:  Reviewed past medical,surgical, social and family history.   Reviewed problem list, medications and allergies. Physical Assessment:   Vitals:   03/24/23 1014  BP: 129/84  Pulse: 88  Weight: 204 lb 6.4 oz (92.7 kg)  Height: 5\' 7"  (1.702 m)  Body mass index is 32.01 kg/m.        Physical  Examination:   General appearance - well appearing, and in no distress  Mental status - alert, oriented to person, place, and time  Psych:  She has a normal mood and affect  Skin - warm and dry, normal color, no suspicious lesions noted  Chest - effort normal, all lung fields clear to auscultation bilaterally  Heart - normal rate and regular rhythm  Neck:  midline trachea, no thyromegaly or nodules  Breasts - breasts appear normal, no suspicious masses, no skin or nipple changes or  axillary nodes  Abdomen - soft, nontender, nondistended, no masses or organomegaly  Pelvic - VULVA: normal appearing vulva with no masses, tenderness, single hypopigmented area noted on right labia minora  VAGINA: normal appearing vagina with normal color and discharge, no lesions   CERVIX: not surgically absent  Thin prep pap is not done   UTERUS: surgically absent  ADNEXA: No adnexal masses or tenderness noted.  Rectal - deferred but no abnormal skin lesions noted  Extremities:  No swelling or varicosities noted  Chaperone present for exam.    Assessment & Plan:  1. Well woman exam with routine gynecological exam (Primary) - Pap smear not indicated - Mammogram 10/2022 - Colonoscopy 2023 - Bone mineral density guidelines reviewed - screening lab work done with PCP, Dr. Arman Filter - vaccines reviewed/updated  2.  Deep vein thrombosis (DVT) of other vein of left lower extremity, unspecified chronicity (HCC) - age 81  3. Family history of colon cancer  4. History of endometriosis  5. H/O: hysterectomy  6. Vulvar irritation - seeing Dr. Onalee Hua and Dr. Delanna Ahmadi  7. Vulvar lesion - pt is going to return for removal of this to be sure there is no new pathology present.  This is new from my prior exam with her.   Meds: No orders of the defined types were placed in this encounter.   Follow-up: Return in about 1 year (around 03/23/2024).  Jerene Bears, MD 03/24/2023 10:46 AM

## 2023-03-27 ENCOUNTER — Encounter: Payer: Self-pay | Admitting: Hematology & Oncology

## 2023-03-28 ENCOUNTER — Ambulatory Visit: Payer: Self-pay

## 2023-03-28 DIAGNOSIS — M5459 Other low back pain: Secondary | ICD-10-CM | POA: Diagnosis not present

## 2023-03-28 DIAGNOSIS — R279 Unspecified lack of coordination: Secondary | ICD-10-CM | POA: Diagnosis not present

## 2023-03-28 DIAGNOSIS — M6281 Muscle weakness (generalized): Secondary | ICD-10-CM

## 2023-03-28 DIAGNOSIS — R102 Pelvic and perineal pain: Secondary | ICD-10-CM | POA: Diagnosis not present

## 2023-03-28 DIAGNOSIS — M62838 Other muscle spasm: Secondary | ICD-10-CM

## 2023-03-28 NOTE — Therapy (Signed)
 OUTPATIENT PHYSICAL THERAPY FEMALE PELVIC TREATMENT   Patient Name: Karen [name] MRN: [id number] DOB:Mar 16, 1970, 53 y.o., female Today's Date: 03/28/2023  END OF SESSION:  PT End of Session - 03/28/23 1156     Visit Number 34    Date for PT Re-Evaluation 04/26/23    Authorization Type [employer]    PT Start Time 1156    PT Stop Time 1230    PT Time Calculation (min) 34 min    Activity Tolerance Patient tolerated treatment well    Behavior During Therapy [facility] for tasks assessed/performed                      Past Medical History:  Diagnosis Date   Adenomyosis    Anemia    Arthritis 2013   L knee gets steroid injections    Asthmatic bronchitis 01/31/2017   Back pain    Chronic constipation    Diarrhea    DVT of lower extremity (deep venous thrombosis) (HCC)    Dysmenorrhea    Endometriosis    Esophageal stricture    GERD (gastroesophageal reflux disease)    History of kidney stones    History of uterine fibroid    IBS (irritable bowel syndrome)    Interstitial cystitis    Lactose intolerance    Migraine    with aura   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   PONV (postoperative nausea and vomiting)    Recurrent upper respiratory infection (URI)    Sebaceous cyst of breast, right lower inner quadrant 10/25/2012   Excised 11/21/12    Sleep apnea    Syncope, non cardiac    Past Surgical History:  Procedure Laterality Date   ARTHROSCOPIC HAGLUNDS REPAIR     BREAST CYST EXCISION Right 11/21/2012   Procedure: CYST EXCISION BREAST;  Surgeon: Currie Paris, MD;  Location: [facility];  Service: General;  Laterality: Right;   BREAST CYST EXCISION Bilateral 01/18/2022   Procedure: EXCISION OF SEBACEOUS CYST BILATERAL BREAST;  Surgeon: Harriette Bouillon, MD;  Location: [facility];  Service: General;  Laterality: Bilateral;   BREAST EXCISIONAL BIOPSY Right 11/2012   CESAREAN SECTION  04/19/1993   CHOLECYSTECTOMY      COLONOSCOPY  05/02/2011   Procedure: COLONOSCOPY;  Surgeon: Vertell Novak., MD;  Location: [facility];  Service: Endoscopy;  Laterality: N/A;   colonoscopy  11/04/2014   DENTAL SURGERY  03/06/2018   2 surgeries ( 03/06/2018 and 04/06/2018)   to remove two separate benign tumors   ESOPHAGEAL MANOMETRY N/A 11/04/2015   Procedure: ESOPHAGEAL MANOMETRY (EM);  Surgeon: Carman Ching, MD;  Location: [facility];  Service: Endoscopy;  Laterality: N/A;   ESOPHAGOGASTRODUODENOSCOPY  05/02/2011   Procedure: ESOPHAGOGASTRODUODENOSCOPY (EGD);  Surgeon: Vertell Novak., MD;  Location: [facility];  Service: Endoscopy;  Laterality: N/A;   ESOPHAGOGASTRODUODENOSCOPY N/A 09/01/2014   Procedure: ESOPHAGOGASTRODUODENOSCOPY (EGD);  Surgeon: Carman Ching, MD;  Location: [facility];  Service: Endoscopy;  Laterality: N/A;   KNEE SURGERY     LAPAROSCOPY     x 4   LESION REMOVAL N/A 01/18/2022   Procedure: EXCISION OF SEBACEOUS CYSTS CHEST AND NECK;  Surgeon: Harriette Bouillon, MD;  Location:  [facility];  Service: General;  Laterality: N/A;   PH IMPEDANCE STUDY N/A 11/04/2015   Procedure: PH IMPEDANCE STUDY;  Surgeon: Carman Ching, MD;  Location: [facility];  Service: Endoscopy;  Laterality: N/A;   VAGINAL HYSTERECTOMY  2010   TVH--ovaries remain  Patient Active Problem List   Diagnosis Date Noted   Sjogren syndrome with keratoconjunctivitis (HCC) 12/09/2022   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   Elevated LFTs 04/04/2021   Colitis 04/04/2021   Bacterial overgrowth syndrome 05/21/2020   Obesity 05/21/2020   History of endometriosis 05/21/2020   Constipation due to outlet dysfunction 02/05/2020   Gastroesophageal reflux disease 02/05/2020   Obstructive sleep apnea treated with continuous positive airway pressure (CPAP) 06/16/2017   Perennial allergic rhinitis with a nonallergic component 01/31/2017   Asthmatic bronchitis 01/31/2017   GI symptoms 01/31/2017   Family history of  colon cancer 01/27/2015   Chest pain 12/13/2012   Dyspnea 12/13/2012   DVT of lower extremity (deep venous thrombosis) (HCC) 01/03/1990    PCP: Lorenda Ishihara, MD  REFERRING PROVIDER: Jerene Bears, MD   REFERRING DIAG: M62.89 (ICD-10-CM) - Pelvic floor dysfunction  THERAPY DIAG:  Pelvic pain  Other low back pain  Muscle weakness (generalized)  Other muscle spasm  Unspecified lack of coordination  Rationale for Evaluation and Treatment: Rehabilitation  ONSET DATE: chronic  SUBJECTIVE:                                                                                                                                                                                           SUBJECTIVE STATEMENT:  Pt states that she is having nodule removed from labia tomorrow that gynecologist found. She has been having much more diarrhea.    PAIN: 03/28/23 Are you having pain? Yes NPRS scale: 3/10 Pain location:  Lower abdominal pain, Lt sided pain, low back pain  Pain type: turning, knotted, pressure Pain description: intermittent and constant   Aggravating factors: constipation or frequent bowel movements/constantly having bowel movements  Relieving factors: exercises (bowel massage, happy baby, spinal twists, walking)  PRECAUTIONS: None  WEIGHT BEARING RESTRICTIONS: No  FALLS:  Has patient fallen in last 6 months? No  LIVING ENVIRONMENT: Lives with: lives with their spouse Lives in: House/apartment  OCCUPATION: nurse, in admin  PLOF: Independent  PATIENT GOALS: decrease pain and have more normal bowel movements  PERTINENT HISTORY:  Vaginal hysterectomy, DVT LE, endometriosis, interstitial cystitis, exploratory laps for endometriosis, c-section, cholecystectomy Sexual abuse: No  BOWEL MOVEMENT: Pain with bowel movement: Yes Type of bowel movement:Type (Bristol Stool Scale) 1-7 (sometimes full range in the same bowel movement), Frequency sometimes many times a  day, sometimes up to 4 days in between, and Strain Yes Fully empty rectum: No Leakage: No Pads: No Fiber supplement: No - has attempted metamucil and it made constipation worse  URINATION: Pain with urination: Yes Fully empty bladder: No Stream:  varies  Urgency: Yes:  all the time Frequency: multiple times an hours  Leakage: Urge to void, Walking to the bathroom, Coughing, Sneezing, Laughing, and Exercise Pads: Yes: daily  INTERCOURSE: Pain with intercourse: Initial Penetration and During Penetration Ability to have vaginal penetration:  Yes: with pain Climax: non painful WNL   PREGNANCY: Vaginal deliveries 1 Tearing Yes: 3rd degree tear C-section deliveries 1 Currently pregnant No  PROLAPSE: Periodically will feel vaginal bulging, heaviness in lower abdomen   OBJECTIVE:  11/09/22: No internal rectal or vaginal pelvic floor exam performed due to high level of skin irritation   Weak transversus abdominus contraction  Abdominal restriction and tenderness in bil lower quadrant  Unable to perform pelvic tilt with appropriate coordination  LUMBARAROM/PROM:  A/PROM A/PROM  Eval (% available) 11/09/22 (% available)  Flexion 50 90  Extension 25, pain anteriorly  25, pain in low back  Right lateral flexion 50, pain anteriorly  75, pain on right  Left lateral flexion 50, pain anteriorly  75, pain on right   (Blank rows = not tested) 07/21/22: standing prolapse assessment demonstrated grade 2 anterior vaginal wall laxity   06/08/22:               External Perineal Exam: WNL                              Internal Pelvic Floor: burning reported with palpation of superficial muscles; deep aching/pressure with palpation of Lt levator ani   Patient confirms identification and approves PT to assess internal pelvic floor and treatment Yes  PELVIC MMT:   MMT eval  Vaginal 3/5  Internal Anal Sphincter 1/5  External Anal Sphincter 2/5  Puborectalis 1/5  Diastasis Recti   (Blank  rows = not tested)        TONE: High in Lt levator ani   PROLAPSE: Not able to tell this session due to dyssynergic pelvic floor contraction     06/01/22: COGNITION: Overall cognitive status: Within functional limits for tasks assessed     SENSATION: Light touch: Appears intact Proprioception: Appears intact  GAIT: Comments: decreased hip extension, forward flexed trunk  POSTURE: rounded shoulders, forward head, decreased lumbar lordosis, increased thoracic kyphosis, posterior pelvic tilt, and flexed trunk    LUMBARAROM/PROM:  A/PROM A/PROM  Eval (% available)  Flexion 50  Extension 25, pain anteriorly   Right lateral flexion 50, pain anteriorly   Left lateral flexion 50, pain anteriorly    (Blank rows = not tested)   PALPATION:   General  Significant abdominal restriction and tenderness; decreased rib mobility with mobilization and breathing    TODAY'S TREATMENT:                                                                                                                              DATE:  03/28/23 Manual: Abdominal soft tissue mobilization  Bowel mobilization Ileocecal valve myofascial release Quadruped myofascial release  to lower thoracic/lumbar paraspinals  Prone lower thoracic Grade 2-3 mobilizations Prone soft tissue mobilization to lumbar paraspinals    03/23/23 Manual: Abdominal soft tissue mobilization  Bowel mobilization Ileocecal valve myofascial release Quadruped myofascial release to lower thoracic/lumbar paraspinals  Prone lower thoracic Grade 2-3 mobilizations Prone soft tissue mobilization to lumbar paraspinals  Neuromuscular re-education: Vibration plate 10 min Heel raises Deep squat  Bil hip circles   03/16/23 Manual: Abdominal soft tissue mobilization  Bowel mobilization Ileocecal valve myofascial release Quadruped myofascial release to lower thoracic/lumbar paraspinals  Exercises: Seated pelvic tilts 2 x 10 Seated forward  fold 10 breaths Seated piriformis stretch 60 seconds bil   PATIENT EDUCATION:  Education details: See above Person educated: Patient Education method: Explanation, Demonstration, Tactile cues, Verbal cues, and Handouts Education comprehension: verbalized understanding  HOME EXERCISE PROGRAM: [id number]  ASSESSMENT:  CLINICAL IMPRESSION: Pt having more issues with diarrhea over the last week. She is meeting with GI doctor to see what next steps are. We focused on manual treatment this session due to decreased bowel emptying and increased pain. She tolerated all techniques very well with decreased pain at end of session. She will continue to benefit from skilled PT intervention in order to decrease pain, improve bowel movements, and decrease urinary urgency/incontinence.     OBJECTIVE IMPAIRMENTS: decreased activity tolerance, decreased coordination, decreased endurance, decreased mobility, decreased strength, increased fascial restrictions, increased muscle spasms, impaired tone, postural dysfunction, and pain.   ACTIVITY LIMITATIONS: lifting, bending, continence, and locomotion level  PARTICIPATION LIMITATIONS: interpersonal relationship, community activity, and occupation  PERSONAL FACTORS: 1 comorbidity: medical history  are also affecting patient's functional outcome.   REHAB POTENTIAL: Good  CLINICAL DECISION MAKING: Stable/uncomplicated  EVALUATION COMPLEXITY: Low   GOALS: Goals reviewed with patient? Yes  SHORT TERM GOALS: Target date: 07/06/22 - updated 07/12/22 - updated 08/18/22 - updated 09/22/22 - updated 10/26/22 - updated 11/09/22 - updated 12/15/22 updated 01/18/23  Pt will be independent with HEP.   Baseline: Goal status: MET 07/12/22  2.  Pt will be independent with diaphragmatic breathing and down training activities in order to improve pelvic floor relaxation.  Baseline:  Goal status: MET 07/12/22  3.  Pt will be independent with the knack, urge suppression  technique, and double voiding in order to improve bladder habits and decrease urinary incontinence.   Baseline:  Goal status: MET 09/22/22  4.  Pt will be independent with use of squatty potty, relaxed toileting mechanics, and improved bowel movement techniques in order to increase ease of bowel movements and complete evacuation.   Baseline:  Goal status: MET 11/09/22  5.  Pt will be able to correctly perform diaphragmatic breathing and appropriate pressure management in order to prevent worsening vaginal wall laxity and improve pelvic floor A/ROM.   Baseline:  Goal status: MET 01/18/23  LONG TERM GOALS: Target date: 11/16/2022 - updated 07/12/22 - updated 08/18/22 - updated 09/22/22 - updated 10/26/22 - updated 11/09/22 - updated 12/15/22 - updated 02/21/23 - updated 03/23/23  Pt will be independent with advanced HEP.   Baseline:  Goal status: IN PROGRESS 03/23/23  2.  Pt will demonstrate normal pelvic floor muscle tone and A/ROM, able to achieve 4/5 strength with contractions and 10 sec endurance, in order to provide appropriate lumbopelvic support in functional activities.   Baseline:  Goal status: IN PROGRESS 03/23/23  3.  Pt will increase all impaired lumbar A/ROM by 25% without pain.  Baseline:  Goal status:  IN PROGRESS 03/23/23  4.  Pt will report pain no higher than 3/10 with any activity. Baseline:  Goal status:  IN PROGRESS 03/23/23  5.  Pt will report 0/10 pain with vaginal penetration in order to improve intimate relationship with partner.    Baseline:  Goal status:  IN PROGRESS 03/23/23  6.  Pt will be able to go 2-3 hours in between voids without urgency or incontinence in order to improve QOL and perform all functional activities with less difficulty.   Baseline:  Goal status: MET 02/21/23  7.  Pt will report no leaks with laughing, coughing, sneezing in order to improve comfort with interpersonal relationships and community activities.   Baseline:  Goal status:  IN  PROGRESS 03/23/23  8.  Pt will have consistent bowel movements 4-5x/week without straining or pain.  Baseline:  Goal status:  MET 02/21/23  PLAN:  PT FREQUENCY: 1-2x/week  PT DURATION: 6 months  PLANNED INTERVENTIONS: Therapeutic exercises, Therapeutic activity, Neuromuscular re-education, Balance training, Gait training, Patient/Family education, Self Care, Joint mobilization, Dry Needling, Biofeedback, and Manual therapy  PLAN FOR NEXT SESSION: Continue to work on motor control activities surrounding pelvis; progress mobility; manual techniques as needed for comfort and to increase circulation.   Julio Alm, PT, DPT03/25/2512:29 PM

## 2023-03-29 ENCOUNTER — Encounter: Payer: Self-pay | Admitting: Hematology & Oncology

## 2023-03-29 ENCOUNTER — Other Ambulatory Visit (HOSPITAL_COMMUNITY): Payer: Self-pay

## 2023-03-30 ENCOUNTER — Encounter: Payer: Self-pay | Admitting: Hematology & Oncology

## 2023-03-30 ENCOUNTER — Other Ambulatory Visit (HOSPITAL_COMMUNITY)
Admission: RE | Admit: 2023-03-30 | Discharge: 2023-03-30 | Disposition: A | Discharge status: Home / Self Care | Source: Ambulatory Visit | Attending: Obstetrics & Gynecology | Admitting: Obstetrics & Gynecology

## 2023-03-30 ENCOUNTER — Encounter (HOSPITAL_BASED_OUTPATIENT_CLINIC_OR_DEPARTMENT_OTHER): Payer: Self-pay | Admitting: Obstetrics & Gynecology

## 2023-03-30 ENCOUNTER — Ambulatory Visit (INDEPENDENT_AMBULATORY_CARE_PROVIDER_SITE_OTHER): Payer: Self-pay | Admitting: Obstetrics & Gynecology

## 2023-03-30 ENCOUNTER — Other Ambulatory Visit (HOSPITAL_COMMUNITY): Payer: Self-pay

## 2023-03-30 VITALS — BP 123/73 | HR 85 | Ht 67.5 in | Wt 206.4 lb

## 2023-03-30 DIAGNOSIS — A630 Anogenital (venereal) warts: Secondary | ICD-10-CM | POA: Diagnosis not present

## 2023-03-30 DIAGNOSIS — N9089 Other specified noninflammatory disorders of vulva and perineum: Secondary | ICD-10-CM | POA: Insufficient documentation

## 2023-03-30 MED ORDER — FLUCONAZOLE 150 MG PO TABS
150.0000 mg | ORAL_TABLET | Freq: Once | ORAL | 0 refills | Status: AC
  Filled 2023-03-30: qty 1, 1d supply, fill #0

## 2023-03-30 MED ORDER — SULFAMETHOXAZOLE-TRIMETHOPRIM 800-160 MG PO TABS
1.0000 | ORAL_TABLET | Freq: Two times a day (BID) | ORAL | 0 refills | Status: DC
  Filled 2023-03-30: qty 10, 5d supply, fill #0

## 2023-03-31 ENCOUNTER — Ambulatory Visit: Payer: Self-pay | Admitting: Occupational Therapy

## 2023-03-31 ENCOUNTER — Encounter: Payer: Self-pay | Admitting: Occupational Therapy

## 2023-03-31 DIAGNOSIS — R197 Diarrhea, unspecified: Secondary | ICD-10-CM | POA: Diagnosis not present

## 2023-03-31 DIAGNOSIS — I890 Lymphedema, not elsewhere classified: Secondary | ICD-10-CM | POA: Diagnosis not present

## 2023-03-31 DIAGNOSIS — K209 Esophagitis, unspecified without bleeding: Secondary | ICD-10-CM | POA: Diagnosis not present

## 2023-03-31 DIAGNOSIS — R1310 Dysphagia, unspecified: Secondary | ICD-10-CM | POA: Diagnosis not present

## 2023-03-31 DIAGNOSIS — K602 Anal fissure, unspecified: Secondary | ICD-10-CM | POA: Diagnosis not present

## 2023-03-31 NOTE — Therapy (Signed)
 OUTPATIENT OCCUPATIONAL THERAPY TREATMENT NOTE   L LOWER EXTREMITY/ B LOWER QUADRANT LYMPHEDEMA  Patient Name: Karen [name] MRN: [id number] DOB:Apr 20, 1970, 53 y.o., female Today's Date: 03/31/2023  END OF SESSION:   OT End of Session - 03/31/23 0804     Visit Number 31    Number of Visits 36    Date for OT Re-Evaluation 05/17/23    OT Start Time 0800    OT Stop Time 0907    OT Time Calculation (min) 67 min    Activity Tolerance Patient tolerated treatment well;No increased pain    Behavior During Therapy [facility] for tasks assessed/performed               Past Medical History:  Diagnosis Date   Adenomyosis    Anemia    Arthritis 2013   L knee gets steroid injections    Asthmatic bronchitis 01/31/2017   Back pain    Chronic constipation    Diarrhea    DVT of lower extremity (deep venous thrombosis) (HCC)    Dysmenorrhea    Endometriosis    Esophageal stricture    G6PD deficiency    GERD (gastroesophageal reflux disease)    History of kidney stones    History of uterine fibroid    IBS (irritable bowel syndrome)    Interstitial cystitis    Lactose intolerance    Migraine    with aura   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   PONV (postoperative nausea and vomiting)    Recurrent upper respiratory infection (URI)    Sebaceous cyst of breast, right lower inner quadrant 10/25/2012   Excised 11/21/12    Sleep apnea    Syncope, non cardiac    Past Surgical History:  Procedure Laterality Date   ARTHROSCOPIC HAGLUNDS REPAIR     BREAST CYST EXCISION Right 11/21/2012   Procedure: CYST EXCISION BREAST;  Surgeon: Currie Paris, MD;  Location: [facility];  Service: General;  Laterality: Right;   BREAST CYST EXCISION Bilateral 01/18/2022   Procedure: EXCISION OF SEBACEOUS CYST BILATERAL BREAST;  Surgeon: Harriette Bouillon, MD;  Location: [facility];  Service: General;  Laterality: Bilateral;   BREAST EXCISIONAL BIOPSY Right 11/2012    CESAREAN SECTION  04/19/1993   CHOLECYSTECTOMY     COLONOSCOPY  05/02/2011   Procedure: COLONOSCOPY;  Surgeon: Vertell Novak., MD;  Location: [facility];  Service: Endoscopy;  Laterality: N/A;   colonoscopy  11/04/2014   DENTAL SURGERY  03/06/2018   2 surgeries ( 03/06/2018 and 04/06/2018)   to remove two separate benign tumors   ESOPHAGEAL MANOMETRY N/A 11/04/2015   Procedure: ESOPHAGEAL MANOMETRY (EM);  Surgeon: Carman Ching, MD;  Location: [facility];  Service: Endoscopy;  Laterality: N/A;   ESOPHAGOGASTRODUODENOSCOPY  05/02/2011   Procedure: ESOPHAGOGASTRODUODENOSCOPY (EGD);  Surgeon: Vertell Novak., MD;  Location: [facility];  Service: Endoscopy;  Laterality: N/A;   ESOPHAGOGASTRODUODENOSCOPY N/A 09/01/2014   Procedure: ESOPHAGOGASTRODUODENOSCOPY (EGD);  Surgeon: Carman Ching, MD;  Location: [facility];  Service: Endoscopy;  Laterality: N/A;   KNEE SURGERY     LAPAROSCOPY     x 4   LESION REMOVAL N/A 01/18/2022   Procedure: EXCISION OF SEBACEOUS CYSTS CHEST AND NECK;  Surgeon: Harriette Bouillon, MD;  Location: [facility];  Service: General;  Laterality: N/A;   PH IMPEDANCE STUDY N/A 11/04/2015   Procedure: PH IMPEDANCE STUDY;  Surgeon: Carman Ching, MD;  Location: [facility];  Service: Endoscopy;  Laterality: N/A;   VAGINAL  HYSTERECTOMY  2010   TVH--ovaries remain   Patient Active Problem List   Diagnosis Date Noted   Sjogren syndrome with keratoconjunctivitis (HCC) 12/09/2022   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   Elevated LFTs 04/04/2021   Colitis 04/04/2021   Bacterial overgrowth syndrome 05/21/2020   Obesity 05/21/2020   History of endometriosis 05/21/2020   Constipation due to outlet dysfunction 02/05/2020   Gastroesophageal reflux disease 02/05/2020   Obstructive sleep apnea treated with continuous positive airway pressure (CPAP) 06/16/2017   Perennial allergic rhinitis with a nonallergic component 01/31/2017   Asthmatic bronchitis  01/31/2017   GI symptoms 01/31/2017   Family history of colon cancer 01/27/2015   Chest pain 12/13/2012   Dyspnea 12/13/2012   DVT of lower extremity (deep venous thrombosis) (HCC) 01/03/1990    PCP: Coral Else, MD  REFERRING PROVIDER: Lorenda Ishihara, MD  REFERRING DIAG: I89.0  THERAPY DIAG:  Lymphedema, not elsewhere classified  Rationale for Evaluation and Treatment: Rehabilitation  ONSET DATE: ~2014; exacerbation in 2023  SUBJECTIVE:                                                                                                                                                                                          SUBJECTIVE STATEMENT:  Pt presents for OT to address lower quadrant and LLE lymphedema. Pt describes LE-related leg pain as achy and rates it as a 3/10. Pt states she continues walking for exercise on a regular basis. Pt requests re measurement for custom compression knee highs since it's been quite a while since initial measurements were made.   PERTINENT HISTORY: CVI, OSA (no CPAP), GERD, Esophageal stricture, IBS, Lactose intolerant, Diarrhea, Hx LE DVT  PAIN:  Are you having pain? Yes: NPRS scale: see SUBJECTIVE 3/10 Pain location: BLE, digestive discomfort, R flank pain Pain description: myalgia, tight, heavy, tingling, numbness Aggravating factors: standing, walking, dependent sitting > 30 minutes Relieving factors: elevation  PRECAUTIONS: Other: LYMPHEDEMA; Hx LLE DVT  RED FLAGS: Hx LLE DVT  WEIGHT BEARING RESTRICTIONS: No  FALLS:  Has patient fallen in last 6 months? No  LIVING ENVIRONMENT: Lives with: lives with spouse and daughter Lives in: House/apartment Stairs: Yes; Internal: 14 steps; yes and External: 4 steps; yes Has following equipment at home: None  OCCUPATION: [employer] operational officer, walking, desk work  LEISURE: family time  HAND DOMINANCE: right   PRIOR LEVEL OF FUNCTION: Independent  PATIENT GOALS: Feel better, be  able to be more active without pain, wear preferred street shoes  OBJECTIVE: Note: Objective measures were completed at Evaluation unless otherwise noted.  COGNITION:  Overall cognitive status: Within functional limits for tasks assessed  OBSERVATIONS / OTHER ASSESSMENTS:   POSTURE: WFL  LE ROM: WFL  LE MMT: WFL  LYMPHEDEMA ASSESSMENTS: non-Ca related  INFECTIONS: denies cellulitis and wound hx  BLE COMPARATIVE LIMB VOLUMETRICS Initial 11/15/22  LANDMARK RIGHT (dominant)  R LEG (A-D) 3028.9 ml  R THIGH (E-G) 4809.60 ml  R FULL LIMB (A-G) 7838.5 ml  Limb Volume differential (LVD)  %  Volume change since initial %  Volume change overall V  (Blank rows = not tested)  LANDMARK LEFT  (Rx)  L LEG (A-D) 3189.9 ml  L THIGH (E-G) 4959.8 ml  L FULL LIMB (A-G) 8149.7 ml  Limb Volume differential (LVD)  LEG LVD = 5.32%, L>R; THIGH LVD = 3.12%, L>R; And full LLE LVD = 3.97%, L>R.   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  Skin  Description Hyper-Keratosis Peau d' Orange Shiny Tight Fibrotic/ Indurated Fatty Doughy Spongy/ boggy       R>L x  x   Skin dry Flaky WNL Macerated   mildly      Color Redness Varicosities Blanching Hemosiderin Stain Mottled        x   Odor Malodorous Yeast Fungal infection  WNL      x   Temperature Warm Cool wnl     x    Pitting Edema   1+ 2+ 3+ 4+ Non-pitting         x   Girth Symmetrical Asymmetrical                   Distribution    L>R toes to groin    Stemmer Sign Positive Negative   +L -R   Lymphorrhea History Of:  Present  Absent     x    Wounds History Of Present Absent Venous Arterial Pressure Sheer     x        Signs of Infection Redness Warmth Erythema Acute Swelling Drainage Borders                    Sensation Light Touch Deep pressure Hypersensitivity   In tact Impaired In tact Impaired Absent Impaired   x  x  x     Nails WNL   Fungus nail dystrophy   x     Hair Growth Symmetrical Asymmetrical   x    Skin Creases Base of toes  Ankles   Base of Fingers knees       Abdominal pannus Thigh Lobules  Face/neck   x           GAIT: Distance walked: >500' Assistive device utilized: None Level of assistance: Complete Independence Comments: Functional ambulation and transfers [facility]  LYMPHEDEMA LIFE IMPACT SCALE (LLIS): Initial 11/15/22  50%  FOTO functional outcome measure: Deferred. FOTO discontinued.  TODAY'S TREATMENT:  MLD to BLQ emphasis on colon and LLE Pt edu for le SELF CARE  PATIENT EDUCATION:  Continued Pt/ CG edu for how function of cisterna chyli, part of the thoracic duct of deep abdominal lymphatics, assists with digestion by filtering many of the fats and proteins from the digestive system. Sub optimal lymphatic flow in this region may result in constipation, bloating, swelling, etc. MLD helps mobilize these protein and fats , and the rhythmic movement of manual lymphatic drainage stimulates digestive muscles and increase peristalsis. Provided printed resource for reference.  Topics include outcome of comparative limb volumetrics- starting limb volume differentials (LVDs), technology and gradient techniques used for short stretch, multilayer compression wrapping, simple self-MLD, therapeutic lymphatic pumping exercises, skin/nail care, LE precautions,. compression garment recommendations and specifications, wear and care schedule and compression  garment donning / doffing w assistive devices. Discussed progress towards all OT goals since commencing CDT. All questions answered to the Pt's satisfaction. Good return. Person educated: Patient  Education method: Explanation, Demonstration, and Handouts Education comprehension: verbalized understanding, returned demonstration, verbal cues required, and needs further education  HOME EXERCISE PROGRAM: BLE lymphatic pumping there ex using- 1 sets of 10 reps, each exercise in order-  1-2 x daily, bilaterally Simple self MLD 1 x daily Daily skin care to increase hydration, skin mobility and decrease infection risk- can be done during MLD Compression wraps 23/7 during intensive phase of CDT Compression garments/ devices during self-management phase of CDT  ASSESSMENT:  CLINICAL IMPRESSION:   Repeated anatomical measurements for custom BLE compression stockings and submitted to DME vendor after session. Pt tolerated abdominal MLD and MLD to LLE/LLQ without increased pain.  Performed J strokes to descending, transverse and ascending colon region , then utilized deep abdominal lymphatic pathways, including alternate pressure and release on descending, then repeated transverse, then ascending colon and diaphragmatic breathing. Pt  continues to benefit from skilled OT for Lymphedema care to reduce pain and digestive discomfort. Cont as per POC.  11/15/22 Initial Evaluation: Kriti Katayama is a 53 y.o. female presenting with very mild, stage II, LLE lymphedema 2/2 venous insufficiency and obesity. L Leg swelling and associated pain swelling fluctuates. It has progressed over time, and now no longer resolves with elevation over night. RLE swelling limits balance during functional ambulation. It exacerbates infection and falls risk. LLE/RLQ lymphedema interferes with functional performance in all occupational domains, including basic and instrumental ADLs, productive activities, leisure pursuits and social  participation. Pt will benefit from Occupational Therapy for Complete Decongestive Therapy (CDT) to restore function, to reduce physical and psychologic suffering associated with chronic, progressive lymphedema and associated pain, and to limit infection. CDT will include manual lymphatic drainage (MLD), skin care, therapeutic exercise and compression wraps and garment's devices. Without skilled OT for lymphedema care the condition will worsen and further functional decline is expected  Custom-made gradient compression garments and HOS devices are medically necessary because they are uniquely sized and shaped to fit the exact dimensions of the affected extremities, and to provide appropriate medical grade, graduated compression essential for optimally managing chronic, progressive lymphedema. Multiple custom compression garments are needed to ensure proper hygiene to limit infection risk. Custom compression garments should be replaced q 3-6 months When worn consistently for optimal lipo-lymphedema self-management over time. HOS devices, medically necessary to limit fibrosis buildup in tissue, should be replaced q 2 years and PRN when worn out.      OBJECTIVE IMPAIRMENTS: decreased activity tolerance, decreased knowledge of condition, decreased knowledge of use of DME,  increased edema, impaired sensation, pain, and chronic, progressive leg swelling .   ACTIVITY LIMITATIONS: dependent sitting, extended standing and/ or walking, squatting, and lower body dressing, fitting preferred street shoes  PARTICIPATION LIMITATIONS: shopping, community activity, occupation, and yard work  PERSONAL FACTORS: 3+ comorbidities: Hx DVT,  OSA, CVI. Varicose veins  are also affecting patient's functional outcome.   REHAB POTENTIAL: Good  EVALUATION COMPLEXITY: Moderate   GOALS: Goals reviewed with patient? Yes  SHORT TERM GOALS: Target date: 4th OT Rx visit  SHORT TERM GOALS: Target date: 4th OT Rx visit   Pt  will demonstrate understanding of lymphedema precautions and prevention strategies with modified independence using a printed reference to identify at least 5 precautions and discussing how s/he may implement them into daily life to reduce risk of progression with extra time. Baseline:Max A Goal status: GOAL MET   Pt will be able to apply multilayer, knee length, gradient, compression wraps to one leg at a time with modified assistance (extra time and assistive device/s) to decrease limb volume, to limit infection risk, and to limit lymphedema progression.  Baseline: Dependent Goal status: GOAL DISCHARGED. Pt Going to compression garments instead  LONG TERM GOALS: Target date: 02/08/23  Given this patient's Intake score of 67%/100% on the functional outcomes FOTO tool, patient will experience an increase in function of 5 points to improve basic and instrumental ADLs performance, including lymphedema self-care.  Baseline: Max A Goal status:GOAL DISCHARGED.  FOTO TOOL DISCONTINUED AT CLINIC   Given this patient's Intake score of 50% on the Lymphedema Life Impact Scale (LLIS), patient will experience a reduction of at least 5 points in her perceived level of functional impairment resulting from lymphedema to improve functional performance and quality of life (QOL). Baseline: 50% Goal status: PROGRESSING  Pt will achieve at least a 10% volume reduction in B legs to return limb to typical size and shape, to limit infection risk and LE progression, to decrease pain, to improve function. Baseline: Dependent Goal status: PROGRESSING  4.  Pt will obtain proper compression garments/devices and achieve modified independence (extra time + assistive devices) with donning/doffing to optimize limb volume reductions and limit LE  progression over time. Baseline:  Goal status: ONGOING.  5.  During Intensive phase CDT , with modified independence, Pt will achieve at least 85% compliance with all lymphedema  self-care home program components, including daily skin care, compression wraps and /or garments, simple self MLD and lymphatic pumping therex to habituate LE self care protocol  into ADLs for optimal LE self-management over time. Baseline: Dependent Goal status: PROGRESSING   PLAN:  OT FREQUENCY: 2x/week  OT DURATION: 12 weeks  PLANNED INTERVENTIONS: 97110-Therapeutic exercises, 97530- Therapeutic activity, 97535- Self Care, 62130- Manual therapy, Patient/Family education, Manual lymph drainage, Compression bandaging, and fit with appropriate compression garments  PLAN FOR NEXT SESSION:  Cont MLD Cont Pt edu for LE self-care  Loel Dubonnet, MS, OTR/L, CLT-LANA 03/31/23 12:18 PM '

## 2023-04-02 NOTE — Progress Notes (Signed)
 GYNECOLOGY  VISIT  CC:   vulvar biopsy  HPI: 53 y.o. G2P2 Married [place] or [place] American female here for vulvar biopsy.  New area of whitish tissue on labia minora noted with prior exam.  Biopsy/excision recommended.  She is here for this today  Consent obtained.     Past Medical History:  Diagnosis Date   Adenomyosis    Anemia    Arthritis 2013   L knee gets steroid injections    Asthmatic bronchitis 01/31/2017   Back pain    Chronic constipation    Diarrhea    DVT of lower extremity (deep venous thrombosis) (HCC)    Dysmenorrhea    Endometriosis    Esophageal stricture    G6PD deficiency    GERD (gastroesophageal reflux disease)    History of kidney stones    History of uterine fibroid    IBS (irritable bowel syndrome)    Interstitial cystitis    Lactose intolerance    Migraine    with aura   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   PONV (postoperative nausea and vomiting)    Recurrent upper respiratory infection (URI)    Sebaceous cyst of breast, right lower inner quadrant 10/25/2012   Excised 11/21/12    Sleep apnea    Syncope, non cardiac     MEDS:   Current Outpatient Medications on File Prior to Visit  Medication Sig Dispense Refill   Acetaminophen 500 MG capsule 1 capsule as needed Orally every 6 hrs     albuterol (VENTOLIN HFA) 108 (90 Base) MCG/ACT inhaler Inhale 2 puffs into the lungs every 6 (six) hours as needed for wheezing or shortness of breath. 6.7 g 2   Elderberry 575 MG/5ML SYRP Take 30 mLs by mouth daily.     fluticasone (FLONASE) 50 MCG/ACT nasal spray Place 2 sprays into both nostrils daily. 16 g 5   hydroxychloroquine (PLAQUENIL) 200 MG tablet Take 1 tablet (200 mg total) by mouth 2 (two) times daily. 60 tablet 3   Lactobacillus Casei-Folic Acid (RESTORA RX) 60-1.25 MG CAPS Take 1 capsule by mouth every morning. 30 capsule 11   lansoprazole (PREVACID) 30 MG capsule Take 1 capsule (30 mg total) by mouth daily. 90 capsule 3   levocetirizine (XYZAL)  5 MG tablet Take 1 tablet (5 mg total) by mouth every evening. 90 tablet 1   Lifitegrast (XIIDRA) 5 % SOLN Place 1 drop into both eyes 2 (two) times daily. 180 each 3   nystatin-triamcinolone ointment (MYCOLOG) Apply 1 Application topically daily. 30 g 0   ondansetron (ZOFRAN-ODT) 4 MG disintegrating tablet Dissolve 1 tablet (4 mg total) in mouth every 8 (eight) hours as needed for nausea 30 tablet 2   polyethylene glycol (MIRALAX / GLYCOLAX) 17 g packet Take 17 g by mouth every other day.     Prucalopride Succinate (MOTEGRITY) 1 MG TABS Take 1 tablet by mouth daily. 90 tablet 1   Prucalopride Succinate (MOTEGRITY) 2 MG TABS Take 1 tablet (2 mg total) by mouth daily. 90 tablet 2   Prucalopride Succinate 2 MG TABS Take 1 tablet (2 mg total) by mouth daily. 90 tablet 1   rizatriptan (MAXALT) 5 MG tablet Take 1 tablet (5 mg total) by mouth as needed for migraine. May repeat in 2 hours if needed, no more than 2 pills/24 hours, no more than 3 pills/week. 10 tablet 0   triamcinolone ointment (KENALOG) 0.1 % Apply a pea sized amount to affected vulvar and perianal area 2 (two) times daily.  30 g 5   UNABLE TO FIND Med Name: NIFEDIPINE 0.3%/LIDO1.5% OINT     No current facility-administered medications on file prior to visit.    ALLERGIES: Molds & smuts and Alpha-gal  SH:  married, non smoker  Review of Systems  Constitutional: Negative.   Genitourinary: Negative.     PHYSICAL EXAMINATION:    BP 123/73 (BP Location: Right Arm, Patient Position: Sitting, Cuff Size: Normal)   Pulse 85   Ht 5' 7.5" (1.715 m)   Wt 206 lb 6.4 oz (93.6 kg)   BMI 31.85 kg/m     General appearance: alert, cooperative and appears stated age  Lymph:  no inguinal LAD noted Pelvic: External genitalia:  whitish lesion on right inner labia              Urethra:  normal appearing urethra with no masses, tenderness or lesions              Bartholins and Skenes: normal                 Procedure:  Area cleansed with  Betadine.  Sterile technique used throughout procedure.  Skin anesthestized with Lidocaine 1% plain; 1.74mL.  Area excised with the pick-ups and sterile scissors. Two #3.0 sutures placed for hemostasis.  Dressing not applied.  Sterile procedure used.  Chaperone, Hendricks Milo, CMA, was present for exam.  Assessment/Plan: 1. Vulvar lesion (Primary) - Surgical pathology( [facility]/ [facility])  - will plan to treat with antibiotics for 5 days as well.  Pt has alpha-gal allergy so safe medication list reviewed - sulfamethoxazole-trimethoprim (BACTRIM DS) 800-160 MG tablet; Take 1 tablet by mouth 2 (two) times daily.  Dispense: 10 tablet; Refill: 0 - fluconazole (DIFLUCAN) 150 MG tablet; Take 1 tablet (150 mg total) by mouth once for 1 dose.  Dispense: 1 tablet; Refill: 0

## 2023-04-03 ENCOUNTER — Other Ambulatory Visit (HOSPITAL_COMMUNITY): Payer: Self-pay

## 2023-04-03 ENCOUNTER — Encounter: Payer: Self-pay | Admitting: Hematology & Oncology

## 2023-04-03 ENCOUNTER — Ambulatory Visit: Payer: Self-pay | Admitting: Podiatry

## 2023-04-03 ENCOUNTER — Encounter: Payer: Self-pay | Admitting: Podiatry

## 2023-04-03 DIAGNOSIS — L600 Ingrowing nail: Secondary | ICD-10-CM

## 2023-04-03 NOTE — Progress Notes (Signed)
   Chief Complaint  Patient presents with   Toe Pain    Hallux right - base of lateral border - ingrown procedure 02/13/23, developed paronychia on 03/01/23, but no antibiotics given, continued to soak it, but it looks dark and a little tender, is currently on antibiotics now for a different issue.    Subjective: 53 y.o. female presents today status post permanent nail avulsion procedure of the lateral border of the right great toe that was performed on 02/13/2023 with Dr Charlsie Merles.  Patient states that since that time she has continued to have pain and tenderness to the area.  Presenting for further treatment evaluation.   Past Medical History:  Diagnosis Date   Adenomyosis    Anemia    Arthritis 2013   L knee gets steroid injections    Asthmatic bronchitis 01/31/2017   Back pain    Chronic constipation    Diarrhea    DVT of lower extremity (deep venous thrombosis) (HCC)    Dysmenorrhea    Endometriosis    Esophageal stricture    G6PD deficiency    GERD (gastroesophageal reflux disease)    History of kidney stones    History of uterine fibroid    IBS (irritable bowel syndrome)    Interstitial cystitis    Lactose intolerance    Migraine    with aura   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   PONV (postoperative nausea and vomiting)    Recurrent upper respiratory infection (URI)    Sebaceous cyst of breast, right lower inner quadrant 10/25/2012   Excised 11/21/12    Sleep apnea    Syncope, non cardiac     Objective: Neurovascular status intact.  Significant amount of callus and nail tissue noted to the lateral border of the right great toe consistent with likely residual ingrown toenail  Assessment: #1 s/p partial permanent nail matrixectomy lateral border right great toe #2 recurrent ingrown toenail lateral border right great toe   Plan of care: -Patient evaluated -Decision made today to reanesthetized the toe and perform a temporary partial nail avulsion to the lateral border  of the right great toe to remove any residual offending nail plate.  The toe was prepped in aseptic manner and digital block performed using 3 mL of 2% lidocaine plain.  Partial temporary nail avulsion was performed to the lateral border the right great toe and dressings applied -Prescription for mupirocin ointment apply twice daily -Return to clinic as needed   Felecia Shelling, DPM [facility]  Dr. Felecia Shelling, DPM    [address] [place], [place] [address]                Office [phone]  Fax [phone]

## 2023-04-04 ENCOUNTER — Ambulatory Visit: Payer: Self-pay | Attending: Obstetrics & Gynecology

## 2023-04-04 ENCOUNTER — Other Ambulatory Visit (HOSPITAL_COMMUNITY): Payer: Self-pay

## 2023-04-04 DIAGNOSIS — R279 Unspecified lack of coordination: Secondary | ICD-10-CM

## 2023-04-04 DIAGNOSIS — R102 Pelvic and perineal pain unspecified side: Secondary | ICD-10-CM

## 2023-04-04 DIAGNOSIS — M5459 Other low back pain: Secondary | ICD-10-CM

## 2023-04-04 DIAGNOSIS — M6281 Muscle weakness (generalized): Secondary | ICD-10-CM

## 2023-04-04 DIAGNOSIS — M62838 Other muscle spasm: Secondary | ICD-10-CM | POA: Diagnosis not present

## 2023-04-04 NOTE — Therapy (Signed)
 OUTPATIENT PHYSICAL THERAPY FEMALE PELVIC TREATMENT   Patient Name: Karen [name] MRN: [id number] DOB:1970/06/25, 53 y.o., female Today's Date: 04/04/2023  END OF SESSION:  PT End of Session - 04/04/23 1158     Visit Number 35    Date for PT Re-Evaluation 04/26/23    Authorization Type [employer]    PT Start Time 1156    PT Stop Time 1228    PT Time Calculation (min) 32 min    Activity Tolerance Patient tolerated treatment well    Behavior During Therapy [facility] for tasks assessed/performed                      Past Medical History:  Diagnosis Date   Adenomyosis    Anemia    Arthritis 2013   L knee gets steroid injections    Asthmatic bronchitis 01/31/2017   Back pain    Chronic constipation    Diarrhea    DVT of lower extremity (deep venous thrombosis) (HCC)    Dysmenorrhea    Endometriosis    Esophageal stricture    G6PD deficiency    GERD (gastroesophageal reflux disease)    History of kidney stones    History of uterine fibroid    IBS (irritable bowel syndrome)    Interstitial cystitis    Lactose intolerance    Migraine    with aura   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   PONV (postoperative nausea and vomiting)    Recurrent upper respiratory infection (URI)    Sebaceous cyst of breast, right lower inner quadrant 10/25/2012   Excised 11/21/12    Sleep apnea    Syncope, non cardiac    Past Surgical History:  Procedure Laterality Date   ARTHROSCOPIC HAGLUNDS REPAIR     BREAST CYST EXCISION Right 11/21/2012   Procedure: CYST EXCISION BREAST;  Surgeon: Currie Paris, MD;  Location: [facility];  Service: General;  Laterality: Right;   BREAST CYST EXCISION Bilateral 01/18/2022   Procedure: EXCISION OF SEBACEOUS CYST BILATERAL BREAST;  Surgeon: Harriette Bouillon, MD;  Location: [facility];  Service: General;  Laterality: Bilateral;   BREAST EXCISIONAL BIOPSY Right 11/2012   CESAREAN SECTION  04/19/1993    CHOLECYSTECTOMY     COLONOSCOPY  05/02/2011   Procedure: COLONOSCOPY;  Surgeon: Vertell Novak., MD;  Location: [facility];  Service: Endoscopy;  Laterality: N/A;   colonoscopy  11/04/2014   DENTAL SURGERY  03/06/2018   2 surgeries ( 03/06/2018 and 04/06/2018)   to remove two separate benign tumors   ESOPHAGEAL MANOMETRY N/A 11/04/2015   Procedure: ESOPHAGEAL MANOMETRY (EM);  Surgeon: Carman Ching, MD;  Location: [facility];  Service: Endoscopy;  Laterality: N/A;   ESOPHAGOGASTRODUODENOSCOPY  05/02/2011   Procedure: ESOPHAGOGASTRODUODENOSCOPY (EGD);  Surgeon: Vertell Novak., MD;  Location: [facility];  Service: Endoscopy;  Laterality: N/A;   ESOPHAGOGASTRODUODENOSCOPY N/A 09/01/2014   Procedure: ESOPHAGOGASTRODUODENOSCOPY (EGD);  Surgeon: Carman Ching, MD;  Location: [facility];  Service: Endoscopy;  Laterality: N/A;   KNEE SURGERY     LAPAROSCOPY     x 4   LESION REMOVAL N/A 01/18/2022   Procedure: EXCISION OF SEBACEOUS CYSTS CHEST AND NECK;  Surgeon: Harriette Bouillon, MD;  Location: [facility];  Service: General;  Laterality: N/A;   PH IMPEDANCE STUDY N/A 11/04/2015   Procedure: PH IMPEDANCE STUDY;  Surgeon: Carman Ching, MD;  Location: [facility];  Service: Endoscopy;  Laterality: N/A;   VAGINAL HYSTERECTOMY  2010   TVH--ovaries remain   Patient Active Problem List   Diagnosis Date Noted   Sjogren syndrome with keratoconjunctivitis (HCC) 12/09/2022   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   Elevated LFTs 04/04/2021   Colitis 04/04/2021   Bacterial overgrowth syndrome 05/21/2020   Obesity 05/21/2020   History of endometriosis 05/21/2020   Constipation due to outlet dysfunction 02/05/2020   Gastroesophageal reflux disease 02/05/2020   Obstructive sleep apnea treated with continuous positive airway pressure (CPAP) 06/16/2017   Perennial allergic rhinitis with a nonallergic component 01/31/2017   Asthmatic bronchitis 01/31/2017   GI symptoms 01/31/2017    Family history of colon cancer 01/27/2015   Chest pain 12/13/2012   Dyspnea 12/13/2012   DVT of lower extremity (deep venous thrombosis) (HCC) 01/03/1990    PCP: Lorenda Ishihara, MD  REFERRING PROVIDER: Jerene Bears, MD   REFERRING DIAG: M62.89 (ICD-10-CM) - Pelvic floor dysfunction  THERAPY DIAG:  Pelvic pain  Other low back pain  Muscle weakness (generalized)  Other muscle spasm  Unspecified lack of coordination  Rationale for Evaluation and Treatment: Rehabilitation  ONSET DATE: chronic  SUBJECTIVE:                                                                                                                                                                                           SUBJECTIVE STATEMENT:  Pt states that her stomach has been very upset today due to not sleeping and having a lot of pain from procedure performed on toe yesterday. She had vulvar biopsy performed Friday and waiting on results. She has seen GI and they are going to do colonoscopy/endoscopy.    PAIN: 04/04/23 Are you having pain? Yes NPRS scale: 3/10 Pain location:  Lower abdominal pain, Lt sided pain, low back pain  Pain type: turning, knotted, pressure Pain description: intermittent and constant   Aggravating factors: constipation or frequent bowel movements/constantly having bowel movements  Relieving factors: exercises (bowel massage, happy baby, spinal twists, walking)  PRECAUTIONS: None  WEIGHT BEARING RESTRICTIONS: No  FALLS:  Has patient fallen in last 6 months? No  LIVING ENVIRONMENT: Lives with: lives with their spouse Lives in: House/apartment  OCCUPATION: nurse, in admin  PLOF: Independent  PATIENT GOALS: decrease pain and have more normal bowel movements  PERTINENT HISTORY:  Vaginal hysterectomy, DVT LE, endometriosis, interstitial cystitis, exploratory laps for endometriosis, c-section, cholecystectomy Sexual abuse: No  BOWEL MOVEMENT: Pain with bowel  movement: Yes Type of bowel movement:Type (Bristol Stool Scale) 1-7 (sometimes full range in the same bowel movement), Frequency sometimes many times a day, sometimes up to 4 days in between, and Strain Yes Fully empty rectum:  No Leakage: No Pads: No Fiber supplement: No - has attempted metamucil and it made constipation worse  URINATION: Pain with urination: Yes Fully empty bladder: No Stream:  varies  Urgency: Yes: all the time Frequency: multiple times an hours  Leakage: Urge to void, Walking to the bathroom, Coughing, Sneezing, Laughing, and Exercise Pads: Yes: daily  INTERCOURSE: Pain with intercourse: Initial Penetration and During Penetration Ability to have vaginal penetration:  Yes: with pain Climax: non painful WNL   PREGNANCY: Vaginal deliveries 1 Tearing Yes: 3rd degree tear C-section deliveries 1 Currently pregnant No  PROLAPSE: Periodically will feel vaginal bulging, heaviness in lower abdomen   OBJECTIVE:  11/09/22: No internal rectal or vaginal pelvic floor exam performed due to high level of skin irritation   Weak transversus abdominus contraction  Abdominal restriction and tenderness in bil lower quadrant  Unable to perform pelvic tilt with appropriate coordination  LUMBARAROM/PROM:  A/PROM A/PROM  Eval (% available) 11/09/22 (% available)  Flexion 50 90  Extension 25, pain anteriorly  25, pain in low back  Right lateral flexion 50, pain anteriorly  75, pain on right  Left lateral flexion 50, pain anteriorly  75, pain on right   (Blank rows = not tested) 07/21/22: standing prolapse assessment demonstrated grade 2 anterior vaginal wall laxity   06/08/22:               External Perineal Exam: WNL                              Internal Pelvic Floor: burning reported with palpation of superficial muscles; deep aching/pressure with palpation of Lt levator ani   Patient confirms identification and approves PT to assess internal pelvic floor and treatment  Yes  PELVIC MMT:   MMT eval  Vaginal 3/5  Internal Anal Sphincter 1/5  External Anal Sphincter 2/5  Puborectalis 1/5  Diastasis Recti   (Blank rows = not tested)        TONE: High in Lt levator ani   PROLAPSE: Not able to tell this session due to dyssynergic pelvic floor contraction     06/01/22: COGNITION: Overall cognitive status: Within functional limits for tasks assessed     SENSATION: Light touch: Appears intact Proprioception: Appears intact  GAIT: Comments: decreased hip extension, forward flexed trunk  POSTURE: rounded shoulders, forward head, decreased lumbar lordosis, increased thoracic kyphosis, posterior pelvic tilt, and flexed trunk    LUMBARAROM/PROM:  A/PROM A/PROM  Eval (% available)  Flexion 50  Extension 25, pain anteriorly   Right lateral flexion 50, pain anteriorly   Left lateral flexion 50, pain anteriorly    (Blank rows = not tested)   PALPATION:   General  Significant abdominal restriction and tenderness; decreased rib mobility with mobilization and breathing    TODAY'S TREATMENT:  DATE:  04/04/23 Abdominal soft tissue mobilization  Bowel mobilization Ileocecal valve myofascial release Quadruped myofascial release to lower thoracic/lumbar paraspinals  Prone lower thoracic Grade 2-3 mobilizations Prone soft tissue mobilization to lumbar paraspinals   03/28/23 Manual: Abdominal soft tissue mobilization  Bowel mobilization Ileocecal valve myofascial release Quadruped myofascial release to lower thoracic/lumbar paraspinals  Prone lower thoracic Grade 2-3 mobilizations Prone soft tissue mobilization to lumbar paraspinals    03/23/23 Manual: Abdominal soft tissue mobilization  Bowel mobilization Ileocecal valve myofascial release Quadruped myofascial release to lower thoracic/lumbar paraspinals  Prone lower  thoracic Grade 2-3 mobilizations Prone soft tissue mobilization to lumbar paraspinals  Neuromuscular re-education: Vibration plate 10 min Heel raises Deep squat  Bil hip circles   PATIENT EDUCATION:  Education details: See above Person educated: Patient Education method: Explanation, Demonstration, Tactile cues, Verbal cues, and Handouts Education comprehension: verbalized understanding  HOME EXERCISE PROGRAM: [id number]  ASSESSMENT:  CLINICAL IMPRESSION: Pt overall about the same. She is trying to hold off on doing cleanse until she has colonoscopy/endoscopy, but she has a lot of restriction and feeling of colon in area of transverse colon. Good tolerance to manual techniques today. We discussed that as she heal as from vulvar biopsy and has less irritation, we should plan to return to internal pelvic floor muscle release to tolerance. She will continue to benefit from skilled PT intervention in order to decrease pain, improve bowel movements, and decrease urinary urgency/incontinence.     OBJECTIVE IMPAIRMENTS: decreased activity tolerance, decreased coordination, decreased endurance, decreased mobility, decreased strength, increased fascial restrictions, increased muscle spasms, impaired tone, postural dysfunction, and pain.   ACTIVITY LIMITATIONS: lifting, bending, continence, and locomotion level  PARTICIPATION LIMITATIONS: interpersonal relationship, community activity, and occupation  PERSONAL FACTORS: 1 comorbidity: medical history  are also affecting patient's functional outcome.   REHAB POTENTIAL: Good  CLINICAL DECISION MAKING: Stable/uncomplicated  EVALUATION COMPLEXITY: Low   GOALS: Goals reviewed with patient? Yes  SHORT TERM GOALS: Target date: 07/06/22 - updated 07/12/22 - updated 08/18/22 - updated 09/22/22 - updated 10/26/22 - updated 11/09/22 - updated 12/15/22 updated 01/18/23  Pt will be independent with HEP.   Baseline: Goal status: MET 07/12/22  2.  Pt will  be independent with diaphragmatic breathing and down training activities in order to improve pelvic floor relaxation.  Baseline:  Goal status: MET 07/12/22  3.  Pt will be independent with the knack, urge suppression technique, and double voiding in order to improve bladder habits and decrease urinary incontinence.   Baseline:  Goal status: MET 09/22/22  4.  Pt will be independent with use of squatty potty, relaxed toileting mechanics, and improved bowel movement techniques in order to increase ease of bowel movements and complete evacuation.   Baseline:  Goal status: MET 11/09/22  5.  Pt will be able to correctly perform diaphragmatic breathing and appropriate pressure management in order to prevent worsening vaginal wall laxity and improve pelvic floor A/ROM.   Baseline:  Goal status: MET 01/18/23  LONG TERM GOALS: Target date: 11/16/2022 - updated 07/12/22 - updated 08/18/22 - updated 09/22/22 - updated 10/26/22 - updated 11/09/22 - updated 12/15/22 - updated 02/21/23 - updated 03/23/23  Pt will be independent with advanced HEP.   Baseline:  Goal status: IN PROGRESS 03/23/23  2.  Pt will demonstrate normal pelvic floor muscle tone and A/ROM, able to achieve 4/5 strength with contractions and 10 sec endurance, in order to provide appropriate lumbopelvic support in functional activities.   Baseline:  Goal status:  IN PROGRESS 03/23/23  3.  Pt will increase all impaired lumbar A/ROM by 25% without pain.  Baseline:  Goal status:  IN PROGRESS 03/23/23  4.  Pt will report pain no higher than 3/10 with any activity. Baseline:  Goal status:  IN PROGRESS 03/23/23  5.  Pt will report 0/10 pain with vaginal penetration in order to improve intimate relationship with partner.    Baseline:  Goal status:  IN PROGRESS 03/23/23  6.  Pt will be able to go 2-3 hours in between voids without urgency or incontinence in order to improve QOL and perform all functional activities with less difficulty.    Baseline:  Goal status: MET 02/21/23  7.  Pt will report no leaks with laughing, coughing, sneezing in order to improve comfort with interpersonal relationships and community activities.   Baseline:  Goal status:  IN PROGRESS 03/23/23  8.  Pt will have consistent bowel movements 4-5x/week without straining or pain.  Baseline:  Goal status:  MET 02/21/23  PLAN:  PT FREQUENCY: 1-2x/week  PT DURATION: 6 months  PLANNED INTERVENTIONS: Therapeutic exercises, Therapeutic activity, Neuromuscular re-education, Balance training, Gait training, Patient/Family education, Self Care, Joint mobilization, Dry Needling, Biofeedback, and Manual therapy  PLAN FOR NEXT SESSION: Continue to work on motor control activities surrounding pelvis; progress mobility; manual techniques as needed for comfort and to increase circulation.   Julio Alm, PT, DPT04/01/251:24 PM

## 2023-04-05 ENCOUNTER — Telehealth: Payer: Self-pay | Admitting: Podiatry

## 2023-04-05 ENCOUNTER — Other Ambulatory Visit (HOSPITAL_COMMUNITY): Payer: Self-pay

## 2023-04-05 ENCOUNTER — Other Ambulatory Visit: Payer: Self-pay

## 2023-04-05 ENCOUNTER — Other Ambulatory Visit (INDEPENDENT_AMBULATORY_CARE_PROVIDER_SITE_OTHER): Payer: Self-pay

## 2023-04-05 ENCOUNTER — Encounter: Payer: Self-pay | Admitting: Hematology & Oncology

## 2023-04-05 DIAGNOSIS — L03031 Cellulitis of right toe: Secondary | ICD-10-CM

## 2023-04-05 DIAGNOSIS — L600 Ingrowing nail: Secondary | ICD-10-CM

## 2023-04-05 LAB — SURGICAL PATHOLOGY

## 2023-04-05 MED ORDER — MUPIROCIN 2 % EX OINT
1.0000 | TOPICAL_OINTMENT | Freq: Two times a day (BID) | CUTANEOUS | 0 refills | Status: DC
  Filled 2023-04-05: qty 22, 10d supply, fill #0

## 2023-04-05 MED ORDER — MUPIROCIN 2 % EX OINT: TOPICAL_OINTMENT | Freq: Two times a day (BID) | CUTANEOUS | Status: DC

## 2023-04-05 NOTE — Telephone Encounter (Signed)
 Patient has called a few times and left message on nurses voicemail and is calling back regarding prescription for antibiotic ointment was supposed to be sent by Dr. Logan Bores to [facility] at [facility]. Patient would like to receive call once medicine is sent, telephone number, [phone]

## 2023-04-06 ENCOUNTER — Other Ambulatory Visit (HOSPITAL_COMMUNITY): Payer: Self-pay

## 2023-04-06 ENCOUNTER — Ambulatory Visit: Attending: Surgery | Admitting: Occupational Therapy

## 2023-04-06 ENCOUNTER — Encounter: Payer: Self-pay | Admitting: Hematology & Oncology

## 2023-04-06 DIAGNOSIS — I890 Lymphedema, not elsewhere classified: Secondary | ICD-10-CM | POA: Insufficient documentation

## 2023-04-06 NOTE — Therapy (Signed)
 OUTPATIENT OCCUPATIONAL THERAPY TREATMENT NOTE   L LOWER EXTREMITY/ B LOWER QUADRANT LYMPHEDEMA  Patient Name: Karen [name] MRN: [id number] DOB:16-Jan-1970, 53 y.o., female Today's Date: 04/06/2023  END OF SESSION:   OT End of Session - 04/06/23 1012     Visit Number 32    Number of Visits 36    Date for OT Re-Evaluation 05/17/23    OT Start Time 1005    OT Stop Time 1105    OT Time Calculation (min) 60 min    Activity Tolerance Patient tolerated treatment well;No increased pain    Behavior During Therapy [facility] for tasks assessed/performed               Past Medical History:  Diagnosis Date   Adenomyosis    Anemia    Arthritis 2013   L knee gets steroid injections    Asthmatic bronchitis 01/31/2017   Back pain    Chronic constipation    Diarrhea    DVT of lower extremity (deep venous thrombosis) (HCC)    Dysmenorrhea    Endometriosis    Esophageal stricture    G6PD deficiency    GERD (gastroesophageal reflux disease)    History of kidney stones    History of uterine fibroid    IBS (irritable bowel syndrome)    Interstitial cystitis    Lactose intolerance    Migraine    with aura   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   PONV (postoperative nausea and vomiting)    Recurrent upper respiratory infection (URI)    Sebaceous cyst of breast, right lower inner quadrant 10/25/2012   Excised 11/21/12    Sleep apnea    Syncope, non cardiac    Past Surgical History:  Procedure Laterality Date   ARTHROSCOPIC HAGLUNDS REPAIR     BREAST CYST EXCISION Right 11/21/2012   Procedure: CYST EXCISION BREAST;  Surgeon: Currie Paris, MD;  Location: [facility];  Service: General;  Laterality: Right;   BREAST CYST EXCISION Bilateral 01/18/2022   Procedure: EXCISION OF SEBACEOUS CYST BILATERAL BREAST;  Surgeon: Harriette Bouillon, MD;  Location: [facility];  Service: General;  Laterality: Bilateral;   BREAST EXCISIONAL BIOPSY Right 11/2012   CESAREAN  SECTION  04/19/1993   CHOLECYSTECTOMY     COLONOSCOPY  05/02/2011   Procedure: COLONOSCOPY;  Surgeon: Vertell Novak., MD;  Location: [facility];  Service: Endoscopy;  Laterality: N/A;   colonoscopy  11/04/2014   DENTAL SURGERY  03/06/2018   2 surgeries ( 03/06/2018 and 04/06/2018)   to remove two separate benign tumors   ESOPHAGEAL MANOMETRY N/A 11/04/2015   Procedure: ESOPHAGEAL MANOMETRY (EM);  Surgeon: Carman Ching, MD;  Location: [facility];  Service: Endoscopy;  Laterality: N/A;   ESOPHAGOGASTRODUODENOSCOPY  05/02/2011   Procedure: ESOPHAGOGASTRODUODENOSCOPY (EGD);  Surgeon: Vertell Novak., MD;  Location: [facility];  Service: Endoscopy;  Laterality: N/A;   ESOPHAGOGASTRODUODENOSCOPY N/A 09/01/2014   Procedure: ESOPHAGOGASTRODUODENOSCOPY (EGD);  Surgeon: Carman Ching, MD;  Location: [facility];  Service: Endoscopy;  Laterality: N/A;   KNEE SURGERY     LAPAROSCOPY     x 4   LESION REMOVAL N/A 01/18/2022   Procedure: EXCISION OF SEBACEOUS CYSTS CHEST AND NECK;  Surgeon: Harriette Bouillon, MD;  Location: [facility];  Service: General;  Laterality: N/A;   PH IMPEDANCE STUDY N/A 11/04/2015   Procedure: PH IMPEDANCE STUDY;  Surgeon: Carman Ching, MD;  Location: [facility];  Service: Endoscopy;  Laterality: N/A;   VAGINAL  HYSTERECTOMY  2010   TVH--ovaries remain   Patient Active Problem List   Diagnosis Date Noted   Sjogren syndrome with keratoconjunctivitis (HCC) 12/09/2022   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   Elevated LFTs 04/04/2021   Colitis 04/04/2021   Bacterial overgrowth syndrome 05/21/2020   Obesity 05/21/2020   History of endometriosis 05/21/2020   Constipation due to outlet dysfunction 02/05/2020   Gastroesophageal reflux disease 02/05/2020   Obstructive sleep apnea treated with continuous positive airway pressure (CPAP) 06/16/2017   Perennial allergic rhinitis with a nonallergic component 01/31/2017   Asthmatic bronchitis 01/31/2017   GI  symptoms 01/31/2017   Family history of colon cancer 01/27/2015   Chest pain 12/13/2012   Dyspnea 12/13/2012   DVT of lower extremity (deep venous thrombosis) (HCC) 01/03/1990    PCP: Coral Else, MD  REFERRING PROVIDER: Lorenda Ishihara, MD  REFERRING DIAG: I89.0  THERAPY DIAG:  Lymphedema, not elsewhere classified  Rationale for Evaluation and Treatment: Rehabilitation  ONSET DATE: ~2014; exacerbation in 2023  SUBJECTIVE:                                                                                                                                                                                          SUBJECTIVE STATEMENT:  Pt presents for OT to address lower quadrant and LLE lymphedema. Pt does not numerically rate pain/ discomfort today. Pt reports that the nature of the lesion excised by dermatologist recently is inconclusive.   PERTINENT HISTORY: CVI, OSA (no CPAP), GERD, Esophageal stricture, IBS, Lactose intolerant, Diarrhea, Hx LE DVT  PAIN:  Are you having pain? Yes: NPRS scale: see SUBJECTIVE not rated/10 Pain location: LLE, digestive discomfort Pain description: myalgia, tight, heavy, tingling, numbness Aggravating factors: standing, walking, dependent sitting > 30 minutes Relieving factors: elevation  PRECAUTIONS: Other: LYMPHEDEMA; Hx LLE DVT  RED FLAGS: Hx LLE DVT  WEIGHT BEARING RESTRICTIONS: No  FALLS:  Has patient fallen in last 6 months? No  LIVING ENVIRONMENT: Lives with: lives with spouse and daughter Lives in: House/apartment Stairs: Yes; Internal: 14 steps; yes and External: 4 steps; yes Has following equipment at home: None  OCCUPATION: [employer] operational officer, walking, desk work  LEISURE: family time  HAND DOMINANCE: right   PRIOR LEVEL OF FUNCTION: Independent  PATIENT GOALS: Feel better, be able to be more active without pain, wear preferred street shoes  OBJECTIVE: Note: Objective measures were completed at Evaluation unless  otherwise noted.  COGNITION:  Overall cognitive status: Within functional limits for tasks assessed   POSTURE: WFL  LE ROM: WFL  LE MMT: WFL  LYMPHEDEMA ASSESSMENTS: non-Ca related  INFECTIONS: denies cellulitis and wound hx  BLE  COMPARATIVE LIMB VOLUMETRICS Initial 11/15/22  LANDMARK RIGHT (dominant)  R LEG (A-D) 3028.9 ml  R THIGH (E-G) 4809.60 ml  R FULL LIMB (A-G) 7838.5 ml  Limb Volume differential (LVD)  %  Volume change since initial %  Volume change overall V  (Blank rows = not tested)  LANDMARK LEFT  (Rx)  L LEG (A-D) 3189.9 ml  L THIGH (E-G) 4959.8 ml  L FULL LIMB (A-G) 8149.7 ml  Limb Volume differential (LVD)  LEG LVD = 5.32%, L>R; THIGH LVD = 3.12%, L>R; And full LLE LVD = 3.97%, L>R.   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  Skin  Description Hyper-Keratosis Peau d' Orange Shiny Tight Fibrotic/ Indurated Fatty Doughy Spongy/ boggy       R>L x  x   Skin dry Flaky WNL Macerated   mildly      Color Redness Varicosities Blanching Hemosiderin Stain Mottled        x   Odor Malodorous Yeast Fungal infection  WNL      x   Temperature Warm Cool wnl     x    Pitting Edema   1+ 2+ 3+ 4+ Non-pitting         x   Girth Symmetrical Asymmetrical                   Distribution    L>R toes to groin    Stemmer Sign Positive Negative   +L -R   Lymphorrhea History Of:  Present Absent     x    Wounds History Of Present Absent Venous Arterial Pressure Sheer     x        Signs of Infection Redness Warmth Erythema Acute Swelling Drainage Borders                     Sensation Light Touch Deep pressure Hypersensitivity   In tact Impaired In tact Impaired Absent Impaired   x  x  x     Nails WNL   Fungus nail dystrophy   x     Hair Growth Symmetrical Asymmetrical   x    Skin Creases Base of toes  Ankles   Base of Fingers knees       Abdominal pannus Thigh Lobules  Face/neck   x           GAIT: Distance walked: >500' Assistive device utilized: None Level of assistance: Complete Independence Comments: Functional ambulation and transfers [facility]  LYMPHEDEMA LIFE IMPACT SCALE (LLIS): Initial 11/15/22  50%  FOTO functional outcome measure: Deferred. FOTO discontinued.  TODAY'S TREATMENT:  MLD to BLQ emphasis on colon and LLE Pt edu for le SELF CARE  PATIENT EDUCATION:  Continued Pt/ CG edu for how function of cisterna chyli, part of the thoracic duct of deep abdominal lymphatics, assists with digestion by filtering many of the fats and proteins from the digestive system. Sub optimal lymphatic flow in this region may result in constipation, bloating, swelling, etc. MLD helps mobilize these protein and fats , and the rhythmic movement of manual lymphatic drainage stimulates digestive muscles and increase peristalsis. Provided printed resource for reference.  Topics include outcome of comparative limb volumetrics- starting limb volume differentials (LVDs), technology and gradient techniques used for short stretch, multilayer compression wrapping, simple self-MLD, therapeutic lymphatic pumping exercises, skin/nail care, LE precautions,. compression garment recommendations and specifications, wear and care schedule and compression garment donning / doffing w assistive devices. Discussed progress towards all OT goals since commencing CDT. All questions answered to the Pt's satisfaction. Good return. Person  educated: Patient  Education method: Explanation, Demonstration, and Handouts Education comprehension: verbalized understanding, returned demonstration, verbal cues required, and needs further education  HOME EXERCISE PROGRAM: BLE lymphatic pumping there ex using- 1 sets of 10 reps, each exercise in order-  1-2 x daily, bilaterally Simple self MLD 1 x daily Daily skin care to increase hydration, skin mobility and decrease infection risk- can be done during MLD Compression wraps 23/7 during intensive phase of CDT Compression garments/ devices during self-management phase of CDT  ASSESSMENT:  CLINICAL IMPRESSION:    Pt tolerated abdominal MLD and MLD to LLE/LLQ without increased pain.  Performed J strokes to descending, transverse and ascending colon region , then utilized deep abdominal lymphatic pathways, including alternate pressure and release on descending, then repeated transverse, then ascending colon and diaphragmatic breathing. Provided MLD to LLE as established with good tolerance. Pt  continues to benefit from skilled OT for Lymphedema care to reduce pain and digestive discomfort. Cont as per POC.  11/15/22 Initial Evaluation: Karen [name] is a 53 y.o. female presenting with very mild, stage II, LLE lymphedema 2/2 venous insufficiency and obesity. L Leg swelling and associated pain swelling fluctuates. It has progressed over time, and now no longer resolves with elevation over night. RLE swelling limits balance during functional ambulation. It exacerbates infection and falls risk. LLE/RLQ lymphedema interferes with functional performance in all occupational domains, including basic and instrumental ADLs, productive activities, leisure pursuits and social participation. Pt will benefit from Occupational Therapy for Complete Decongestive Therapy (CDT) to restore function, to reduce physical and psychologic suffering associated with chronic, progressive lymphedema and associated pain, and to  limit infection. CDT will include manual lymphatic drainage (MLD), skin care, therapeutic exercise and compression wraps and garment's devices. Without skilled OT for lymphedema care the condition will worsen and further functional decline is expected  Custom-made gradient compression garments and HOS devices are medically necessary because they are uniquely sized and shaped to fit the exact dimensions of the affected extremities, and to provide appropriate medical grade, graduated compression essential for optimally managing chronic, progressive lymphedema. Multiple custom compression garments are needed to ensure proper hygiene to limit infection risk. Custom compression garments should be replaced q 3-6 months When worn consistently for optimal lipo-lymphedema self-management over time. HOS devices, medically necessary to limit fibrosis buildup in tissue, should be replaced q 2 years and PRN when worn out.      OBJECTIVE IMPAIRMENTS: decreased activity tolerance, decreased knowledge of condition, decreased knowledge of use of DME, increased edema, impaired sensation, pain,  and chronic, progressive leg swelling .   ACTIVITY LIMITATIONS: dependent sitting, extended standing and/ or walking, squatting, and lower body dressing, fitting preferred street shoes  PARTICIPATION LIMITATIONS: shopping, community activity, occupation, and yard work  PERSONAL FACTORS: 3+ comorbidities: Hx DVT,  OSA, CVI. Varicose veins  are also affecting patient's functional outcome.   REHAB POTENTIAL: Good  EVALUATION COMPLEXITY: Moderate   GOALS: Goals reviewed with patient? Yes  SHORT TERM GOALS: Target date: 4th OT Rx visit  SHORT TERM GOALS: Target date: 4th OT Rx visit   Pt will demonstrate understanding of lymphedema precautions and prevention strategies with modified independence using a printed reference to identify at least 5 precautions and discussing how s/he may implement them into daily life to reduce  risk of progression with extra time. Baseline:Max A Goal status: GOAL MET   Pt will be able to apply multilayer, knee length, gradient, compression wraps to one leg at a time with modified assistance (extra time and assistive device/s) to decrease limb volume, to limit infection risk, and to limit lymphedema progression.  Baseline: Dependent Goal status: GOAL DISCHARGED. Pt Going to compression garments instead  LONG TERM GOALS: Target date: 02/08/23  Given this patient's Intake score of 67%/100% on the functional outcomes FOTO tool, patient will experience an increase in function of 5 points to improve basic and instrumental ADLs performance, including lymphedema self-care.  Baseline: Max A Goal status:GOAL DISCHARGED.  FOTO TOOL DISCONTINUED AT CLINIC   Given this patient's Intake score of 50% on the Lymphedema Life Impact Scale (LLIS), patient will experience a reduction of at least 5 points in her perceived level of functional impairment resulting from lymphedema to improve functional performance and quality of life (QOL). Baseline: 50% Goal status: PROGRESSING  Pt will achieve at least a 10% volume reduction in B legs to return limb to typical size and shape, to limit infection risk and LE progression, to decrease pain, to improve function. Baseline: Dependent Goal status: PROGRESSING  4.  Pt will obtain proper compression garments/devices and achieve modified independence (extra time + assistive devices) with donning/doffing to optimize limb volume reductions and limit LE  progression over time. Baseline:  Goal status: ONGOING.  5.  During Intensive phase CDT , with modified independence, Pt will achieve at least 85% compliance with all lymphedema self-care home program components, including daily skin care, compression wraps and /or garments, simple self MLD and lymphatic pumping therex to habituate LE self care protocol  into ADLs for optimal LE self-management over time. Baseline:  Dependent Goal status: PROGRESSING   PLAN:  OT FREQUENCY: 2x/week  OT DURATION: 12 weeks  PLANNED INTERVENTIONS: 97110-Therapeutic exercises, 97530- Therapeutic activity, 97535- Self Care, 98119- Manual therapy, Patient/Family education, Manual lymph drainage, Compression bandaging, and fit with appropriate compression garments  PLAN FOR NEXT SESSION:  Cont MLD Cont Pt edu for LE self-care  Loel Dubonnet, MS, OTR/L, CLT-LANA 04/06/23 12:29 PM

## 2023-04-07 ENCOUNTER — Other Ambulatory Visit (HOSPITAL_COMMUNITY): Payer: Self-pay

## 2023-04-07 ENCOUNTER — Ambulatory Visit (HOSPITAL_BASED_OUTPATIENT_CLINIC_OR_DEPARTMENT_OTHER): Admitting: Obstetrics & Gynecology

## 2023-04-07 ENCOUNTER — Encounter (HOSPITAL_BASED_OUTPATIENT_CLINIC_OR_DEPARTMENT_OTHER): Payer: Self-pay | Admitting: Obstetrics & Gynecology

## 2023-04-07 VITALS — BP 104/76 | HR 63 | Ht 67.5 in | Wt 206.0 lb

## 2023-04-07 DIAGNOSIS — N9089 Other specified noninflammatory disorders of vulva and perineum: Secondary | ICD-10-CM

## 2023-04-07 MED ORDER — PEG-3350/ELECTROLYTES 236 G PO SOLR
ORAL | 0 refills | Status: DC
  Filled 2023-04-07: qty 4000, 1d supply, fill #0

## 2023-04-07 NOTE — Progress Notes (Signed)
 GYNECOLOGY  VISIT  CC:   suture removal  HPI: 53 y.o. G2P2 Married Black or [place] American female here for removal of sutures after removal.  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  She is on plaquenil.  She has additional questions today and these were addressed.  Has questions about how long this can be present as no recent partner change.  HPV relation to immunosuppression discussed.   Past Medical History:  Diagnosis Date   Adenomyosis    Anemia    Arthritis 2013   L knee gets steroid injections    Asthmatic bronchitis 01/31/2017   Back pain    Chronic constipation    Diarrhea    DVT of lower extremity (deep venous thrombosis) (HCC)    Dysmenorrhea    Endometriosis    Esophageal stricture    G6PD deficiency    GERD (gastroesophageal reflux disease)    History of kidney stones    History of uterine fibroid    IBS (irritable bowel syndrome)    Interstitial cystitis    Lactose intolerance    Migraine    with aura   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   PONV (postoperative nausea and vomiting)    Recurrent upper respiratory infection (URI)    Sebaceous cyst of breast, right lower inner quadrant 10/25/2012   Excised 11/21/12    Sleep apnea    Syncope, non cardiac     MEDS:   Current Outpatient Medications on File Prior to Visit  Medication Sig Dispense Refill   Acetaminophen 500 MG capsule 1 capsule as needed Orally every 6 hrs     albuterol (VENTOLIN HFA) 108 (90 Base) MCG/ACT inhaler Inhale 2 puffs into the lungs every 6 (six) hours as needed for wheezing or shortness of breath. 6.7 g 2   Elderberry 575 MG/5ML SYRP Take 30 mLs by mouth daily.     fluticasone (FLONASE) 50 MCG/ACT nasal spray Place 2 sprays into both nostrils daily. 16 g 5    hydroxychloroquine (PLAQUENIL) 200 MG tablet Take 1 tablet (200 mg total) by mouth 2 (two) times daily. 60 tablet 3   Lactobacillus Casei-Folic Acid (RESTORA RX) 60-1.25 MG CAPS Take 1 capsule by mouth every morning. 30 capsule 11   lansoprazole (PREVACID) 30 MG capsule Take 1 capsule (30 mg total) by mouth daily. 90 capsule 3   levocetirizine (XYZAL) 5 MG tablet Take 1 tablet (5 mg total) by mouth every evening. 90 tablet 1   Lifitegrast (XIIDRA) 5 % SOLN Place 1 drop into both eyes 2 (two) times daily. 180 each 3   mupirocin ointment (BACTROBAN) 2 % Apply 1 Application topically 2 (two) times daily. 22 g 0   nystatin-triamcinolone ointment (MYCOLOG) Apply 1 Application topically daily. 30 g 0   ondansetron (ZOFRAN-ODT) 4 MG disintegrating tablet Dissolve 1 tablet (4 mg total) in mouth every 8 (eight) hours as needed for nausea 30 tablet 2   polyethylene glycol (MIRALAX / GLYCOLAX) 17 g packet Take 17 g by mouth every other day.     rizatriptan (MAXALT) 5 MG tablet Take 1 tablet (5 mg total) by mouth as needed for migraine. May repeat in 2 hours if needed, no more than 2 pills/24 hours, no more than 3 pills/week. 10 tablet 0   sulfamethoxazole-trimethoprim (BACTRIM DS) 800-160 MG tablet Take 1 tablet  by mouth 2 (two) times daily. 10 tablet 0   triamcinolone ointment (KENALOG) 0.1 % Apply a pea sized amount to affected vulvar and perianal area 2 (two) times daily. 30 g 5   UNABLE TO FIND Med Name: NIFEDIPINE 0.3%/LIDO1.5% OINT     Prucalopride Succinate (MOTEGRITY) 1 MG TABS Take 1 tablet by mouth daily. (Patient not taking: Reported on 04/07/2023) 90 tablet 1   Prucalopride Succinate (MOTEGRITY) 2 MG TABS Take 1 tablet (2 mg total) by mouth daily. (Patient not taking: Reported on 04/07/2023) 90 tablet 2   Prucalopride Succinate 2 MG TABS Take 1 tablet (2 mg total) by mouth daily. (Patient not taking: Reported on 04/07/2023) 90 tablet 1   Current Facility-Administered Medications on File Prior to Visit   Medication Dose Route Frequency Provider Last Rate Last Admin   mupirocin ointment (BACTROBAN) 2 %   Nasal BID Felecia Shelling, DPM        ALLERGIES: Molds & smuts and Alpha-gal  SH:  non smoker, married  Review of Systems  Constitutional: Negative.   Genitourinary: Negative.     PHYSICAL EXAMINATION:    BP 104/76   Pulse 63   Ht 5' 7.5" (1.715 m)   Wt 206 lb (93.4 kg)   BMI 31.79 kg/m     General appearance: alert, cooperative and appears stated age  Pelvic: External genitalia:  no lesions, sutures completely removed from inner labia minora on the right.  No bleeding noted.  No new lesions noted.              Assessment/Plan: 1. Vulvar lesion (Primary) -pathology reviewed again.  Questions answered.  Pt is going to monitor monthly and recheck 3 months.  12 minutes spent in face to face discussion with pt.

## 2023-04-11 ENCOUNTER — Ambulatory Visit: Payer: Commercial Managed Care - PPO

## 2023-04-11 ENCOUNTER — Other Ambulatory Visit (HOSPITAL_COMMUNITY): Payer: Self-pay

## 2023-04-11 DIAGNOSIS — M62838 Other muscle spasm: Secondary | ICD-10-CM | POA: Diagnosis not present

## 2023-04-11 DIAGNOSIS — R102 Pelvic and perineal pain unspecified side: Secondary | ICD-10-CM

## 2023-04-11 DIAGNOSIS — R279 Unspecified lack of coordination: Secondary | ICD-10-CM | POA: Diagnosis not present

## 2023-04-11 DIAGNOSIS — M6281 Muscle weakness (generalized): Secondary | ICD-10-CM

## 2023-04-11 DIAGNOSIS — M5459 Other low back pain: Secondary | ICD-10-CM | POA: Diagnosis not present

## 2023-04-11 NOTE — Therapy (Signed)
 OUTPATIENT PHYSICAL THERAPY FEMALE PELVIC TREATMENT   Patient Name: Karen [name] MRN: [id number] DOB:1970/01/23, 53 y.o., female Today's Date: 04/11/2023  END OF SESSION:  PT End of Session - 04/11/23 1155     Visit Number 36    Date for PT Re-Evaluation 04/26/23    Authorization Type [employer]    PT Start Time 1151    PT Stop Time 1229    PT Time Calculation (min) 38 min    Activity Tolerance Patient tolerated treatment well    Behavior During Therapy [facility] for tasks assessed/performed                      Past Medical History:  Diagnosis Date   Adenomyosis    Anemia    Arthritis 2013   L knee gets steroid injections    Asthmatic bronchitis 01/31/2017   Back pain    Chronic constipation    Diarrhea    DVT of lower extremity (deep venous thrombosis) (HCC)    Dysmenorrhea    Endometriosis    Esophageal stricture    G6PD deficiency    GERD (gastroesophageal reflux disease)    History of kidney stones    History of uterine fibroid    IBS (irritable bowel syndrome)    Interstitial cystitis    Lactose intolerance    Migraine    with aura   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   PONV (postoperative nausea and vomiting)    Recurrent upper respiratory infection (URI)    Sebaceous cyst of breast, right lower inner quadrant 10/25/2012   Excised 11/21/12    Sleep apnea    Syncope, non cardiac    Past Surgical History:  Procedure Laterality Date   ARTHROSCOPIC HAGLUNDS REPAIR     BREAST CYST EXCISION Right 11/21/2012   Procedure: CYST EXCISION BREAST;  Surgeon: Currie Paris, MD;  Location: [facility];  Service: General;  Laterality: Right;   BREAST CYST EXCISION Bilateral 01/18/2022   Procedure: EXCISION OF SEBACEOUS CYST BILATERAL BREAST;  Surgeon: Harriette Bouillon, MD;  Location: [facility];  Service: General;  Laterality: Bilateral;   BREAST EXCISIONAL BIOPSY Right 11/2012   CESAREAN SECTION  04/19/1993    CHOLECYSTECTOMY     COLONOSCOPY  05/02/2011   Procedure: COLONOSCOPY;  Surgeon: Vertell Novak., MD;  Location: [facility];  Service: Endoscopy;  Laterality: N/A;   colonoscopy  11/04/2014   DENTAL SURGERY  03/06/2018   2 surgeries ( 03/06/2018 and 04/06/2018)   to remove two separate benign tumors   ESOPHAGEAL MANOMETRY N/A 11/04/2015   Procedure: ESOPHAGEAL MANOMETRY (EM);  Surgeon: Carman Ching, MD;  Location: [facility];  Service: Endoscopy;  Laterality: N/A;   ESOPHAGOGASTRODUODENOSCOPY  05/02/2011   Procedure: ESOPHAGOGASTRODUODENOSCOPY (EGD);  Surgeon: Vertell Novak., MD;  Location: [facility];  Service: Endoscopy;  Laterality: N/A;   ESOPHAGOGASTRODUODENOSCOPY N/A 09/01/2014   Procedure: ESOPHAGOGASTRODUODENOSCOPY (EGD);  Surgeon: Carman Ching, MD;  Location: [facility];  Service: Endoscopy;  Laterality: N/A;   KNEE SURGERY     LAPAROSCOPY     x 4   LESION REMOVAL N/A 01/18/2022   Procedure: EXCISION OF SEBACEOUS CYSTS CHEST AND NECK;  Surgeon: Harriette Bouillon, MD;  Location: [facility];  Service: General;  Laterality: N/A;   PH IMPEDANCE STUDY N/A 11/04/2015   Procedure: PH IMPEDANCE STUDY;  Surgeon: Carman Ching, MD;  Location: [facility];  Service: Endoscopy;  Laterality: N/A;   VAGINAL HYSTERECTOMY  2010   TVH--ovaries remain   Patient Active Problem List   Diagnosis Date Noted   Sjogren syndrome with keratoconjunctivitis (HCC) 12/09/2022   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   Elevated LFTs 04/04/2021   Colitis 04/04/2021   Bacterial overgrowth syndrome 05/21/2020   Obesity 05/21/2020   History of endometriosis 05/21/2020   Constipation due to outlet dysfunction 02/05/2020   Gastroesophageal reflux disease 02/05/2020   Obstructive sleep apnea treated with continuous positive airway pressure (CPAP) 06/16/2017   Perennial allergic rhinitis with a nonallergic component 01/31/2017   Asthmatic bronchitis 01/31/2017   GI symptoms 01/31/2017    Family history of colon cancer 01/27/2015   Chest pain 12/13/2012   Dyspnea 12/13/2012   DVT of lower extremity (deep venous thrombosis) (HCC) 01/03/1990    PCP: Lorenda Ishihara, MD  REFERRING PROVIDER: Jerene Bears, MD   REFERRING DIAG: M62.89 (ICD-10-CM) - Pelvic floor dysfunction  THERAPY DIAG:  Pelvic pain  Other low back pain  Muscle weakness (generalized)  Other muscle spasm  Unspecified lack of coordination  Rationale for Evaluation and Treatment: Rehabilitation  ONSET DATE: chronic  SUBJECTIVE:                                                                                                                                                                                           SUBJECTIVE STATEMENT:  Colonoscopy/endoscopy schedule for 05/25/23. She has 3 month follow-up with gynecologist and biopsy doesn't appear to look cancerous at this time, but they want to watch closely since it came up really quickly.    PAIN: 04/11/23 Are you having pain? Yes NPRS scale: 3/10 Pain location:  Lower abdominal pain, Lt sided pain, low back pain  Pain type: turning, knotted, pressure Pain description: intermittent and constant   Aggravating factors: constipation or frequent bowel movements/constantly having bowel movements  Relieving factors: exercises (bowel massage, happy baby, spinal twists, walking)  PRECAUTIONS: None  WEIGHT BEARING RESTRICTIONS: No  FALLS:  Has patient fallen in last 6 months? No  LIVING ENVIRONMENT: Lives with: lives with their spouse Lives in: House/apartment  OCCUPATION: nurse, in admin  PLOF: Independent  PATIENT GOALS: decrease pain and have more normal bowel movements  PERTINENT HISTORY:  Vaginal hysterectomy, DVT LE, endometriosis, interstitial cystitis, exploratory laps for endometriosis, c-section, cholecystectomy Sexual abuse: No  BOWEL MOVEMENT: Pain with bowel movement: Yes Type of bowel movement:Type (Bristol Stool  Scale) 1-7 (sometimes full range in the same bowel movement), Frequency sometimes many times a day, sometimes up to 4 days in between, and Strain Yes Fully empty rectum: No Leakage: No Pads: No Fiber supplement: No - has attempted metamucil and it  made constipation worse  URINATION: Pain with urination: Yes Fully empty bladder: No Stream:  varies  Urgency: Yes: all the time Frequency: multiple times an hours  Leakage: Urge to void, Walking to the bathroom, Coughing, Sneezing, Laughing, and Exercise Pads: Yes: daily  INTERCOURSE: Pain with intercourse: Initial Penetration and During Penetration Ability to have vaginal penetration:  Yes: with pain Climax: non painful WNL   PREGNANCY: Vaginal deliveries 1 Tearing Yes: 3rd degree tear C-section deliveries 1 Currently pregnant No  PROLAPSE: Periodically will feel vaginal bulging, heaviness in lower abdomen   OBJECTIVE:  11/09/22: No internal rectal or vaginal pelvic floor exam performed due to high level of skin irritation   Weak transversus abdominus contraction  Abdominal restriction and tenderness in bil lower quadrant  Unable to perform pelvic tilt with appropriate coordination  LUMBARAROM/PROM:  A/PROM A/PROM  Eval (% available) 11/09/22 (% available)  Flexion 50 90  Extension 25, pain anteriorly  25, pain in low back  Right lateral flexion 50, pain anteriorly  75, pain on right  Left lateral flexion 50, pain anteriorly  75, pain on right   (Blank rows = not tested) 07/21/22: standing prolapse assessment demonstrated grade 2 anterior vaginal wall laxity   06/08/22:               External Perineal Exam: WNL                              Internal Pelvic Floor: burning reported with palpation of superficial muscles; deep aching/pressure with palpation of Lt levator ani   Patient confirms identification and approves PT to assess internal pelvic floor and treatment Yes  PELVIC MMT:   MMT eval  Vaginal 3/5  Internal  Anal Sphincter 1/5  External Anal Sphincter 2/5  Puborectalis 1/5  Diastasis Recti   (Blank rows = not tested)        TONE: High in Lt levator ani   PROLAPSE: Not able to tell this session due to dyssynergic pelvic floor contraction     06/01/22: COGNITION: Overall cognitive status: Within functional limits for tasks assessed     SENSATION: Light touch: Appears intact Proprioception: Appears intact  GAIT: Comments: decreased hip extension, forward flexed trunk  POSTURE: rounded shoulders, forward head, decreased lumbar lordosis, increased thoracic kyphosis, posterior pelvic tilt, and flexed trunk    LUMBARAROM/PROM:  A/PROM A/PROM  Eval (% available)  Flexion 50  Extension 25, pain anteriorly   Right lateral flexion 50, pain anteriorly   Left lateral flexion 50, pain anteriorly    (Blank rows = not tested)   PALPATION:   General  Significant abdominal restriction and tenderness; decreased rib mobility with mobilization and breathing    TODAY'S TREATMENT:  DATE:  04/11/23 Manual: Prone PA mobilizations from T10-L5 and sacral base grade 2-4 Myofascial release to thoracolumbar fascia Abdominal mobilization/bowel massage Exercises: Prone hip flexor stretches with manual overpressure  Supine hamstring stretches with manual overpressure Supine piriformis stretches with manual overpressure   04/04/23 Abdominal soft tissue mobilization  Bowel mobilization Ileocecal valve myofascial release Quadruped myofascial release to lower thoracic/lumbar paraspinals  Prone lower thoracic Grade 2-3 mobilizations Prone soft tissue mobilization to lumbar paraspinals   03/28/23 Manual: Abdominal soft tissue mobilization  Bowel mobilization Ileocecal valve myofascial release Quadruped myofascial release to lower thoracic/lumbar paraspinals  Prone lower  thoracic Grade 2-3 mobilizations Prone soft tissue mobilization to lumbar paraspinals   PATIENT EDUCATION:  Education details: See above Person educated: Patient Education method: Programmer, multimedia, Demonstration, Actor cues, Verbal cues, and Handouts Education comprehension: verbalized understanding  HOME EXERCISE PROGRAM: [id number]  ASSESSMENT:  CLINICAL IMPRESSION: Pt maintaining overall well, but has had more diarrhea nad feels like she may need a cleanse. We performed some manual assist stretching today with significant limitations present and some hypersensitivity. She had notable restriction in lumbar/sacral mobility with mobilizations; Rt SIJ more hypomobile than Lt and mobilizations did help to release well. Bowel massage continues to be helpful per patient report. She will continue to benefit from skilled PT intervention in order to decrease pain, improve bowel movements, and decrease urinary urgency/incontinence.     OBJECTIVE IMPAIRMENTS: decreased activity tolerance, decreased coordination, decreased endurance, decreased mobility, decreased strength, increased fascial restrictions, increased muscle spasms, impaired tone, postural dysfunction, and pain.   ACTIVITY LIMITATIONS: lifting, bending, continence, and locomotion level  PARTICIPATION LIMITATIONS: interpersonal relationship, community activity, and occupation  PERSONAL FACTORS: 1 comorbidity: medical history  are also affecting patient's functional outcome.   REHAB POTENTIAL: Good  CLINICAL DECISION MAKING: Stable/uncomplicated  EVALUATION COMPLEXITY: Low   GOALS: Goals reviewed with patient? Yes  SHORT TERM GOALS: Target date: 07/06/22 - updated 07/12/22 - updated 08/18/22 - updated 09/22/22 - updated 10/26/22 - updated 11/09/22 - updated 12/15/22 updated 01/18/23  Pt will be independent with HEP.   Baseline: Goal status: MET 07/12/22  2.  Pt will be independent with diaphragmatic breathing and down training activities  in order to improve pelvic floor relaxation.  Baseline:  Goal status: MET 07/12/22  3.  Pt will be independent with the knack, urge suppression technique, and double voiding in order to improve bladder habits and decrease urinary incontinence.   Baseline:  Goal status: MET 09/22/22  4.  Pt will be independent with use of squatty potty, relaxed toileting mechanics, and improved bowel movement techniques in order to increase ease of bowel movements and complete evacuation.   Baseline:  Goal status: MET 11/09/22  5.  Pt will be able to correctly perform diaphragmatic breathing and appropriate pressure management in order to prevent worsening vaginal wall laxity and improve pelvic floor A/ROM.   Baseline:  Goal status: MET 01/18/23  LONG TERM GOALS: Target date: 11/16/2022 - updated 07/12/22 - updated 08/18/22 - updated 09/22/22 - updated 10/26/22 - updated 11/09/22 - updated 12/15/22 - updated 02/21/23 - updated 03/23/23  Pt will be independent with advanced HEP.   Baseline:  Goal status: IN PROGRESS 03/23/23  2.  Pt will demonstrate normal pelvic floor muscle tone and A/ROM, able to achieve 4/5 strength with contractions and 10 sec endurance, in order to provide appropriate lumbopelvic support in functional activities.   Baseline:  Goal status: IN PROGRESS 03/23/23  3.  Pt will increase all impaired lumbar A/ROM by  25% without pain.  Baseline:  Goal status:  IN PROGRESS 03/23/23  4.  Pt will report pain no higher than 3/10 with any activity. Baseline:  Goal status:  IN PROGRESS 03/23/23  5.  Pt will report 0/10 pain with vaginal penetration in order to improve intimate relationship with partner.    Baseline:  Goal status:  IN PROGRESS 03/23/23  6.  Pt will be able to go 2-3 hours in between voids without urgency or incontinence in order to improve QOL and perform all functional activities with less difficulty.   Baseline:  Goal status: MET 02/21/23  7.  Pt will report no leaks with  laughing, coughing, sneezing in order to improve comfort with interpersonal relationships and community activities.   Baseline:  Goal status:  IN PROGRESS 03/23/23  8.  Pt will have consistent bowel movements 4-5x/week without straining or pain.  Baseline:  Goal status:  MET 02/21/23  PLAN:  PT FREQUENCY: 1-2x/week  PT DURATION: 6 months  PLANNED INTERVENTIONS: Therapeutic exercises, Therapeutic activity, Neuromuscular re-education, Balance training, Gait training, Patient/Family education, Self Care, Joint mobilization, Dry Needling, Biofeedback, and Manual therapy  PLAN FOR NEXT SESSION: Continue to work on motor control activities surrounding pelvis; progress mobility; manual techniques as needed for comfort and to increase circulation.   Julio Alm, PT, DPT04/08/2510:32 PM

## 2023-04-12 ENCOUNTER — Ambulatory Visit: Admitting: Occupational Therapy

## 2023-04-12 DIAGNOSIS — I890 Lymphedema, not elsewhere classified: Secondary | ICD-10-CM

## 2023-04-12 NOTE — Therapy (Unsigned)
 OUTPATIENT OCCUPATIONAL THERAPY TREATMENT NOTE   L LOWER EXTREMITY/ B LOWER QUADRANT LYMPHEDEMA  Patient Name: Karen [name] MRN: [id number] DOB:1970-08-09, 53 y.o., female Today's Date: 04/12/2023  END OF SESSION:   OT End of Session - 04/12/23 0809     Visit Number 33    Number of Visits 36    Date for OT Re-Evaluation 05/17/23    OT Start Time 0805    OT Stop Time 0857    OT Time Calculation (min) 52 min    Activity Tolerance Patient tolerated treatment well;No increased pain    Behavior During Therapy [facility] for tasks assessed/performed               Past Medical History:  Diagnosis Date   Adenomyosis    Anemia    Arthritis 2013   L knee gets steroid injections    Asthmatic bronchitis 01/31/2017   Back pain    Chronic constipation    Diarrhea    DVT of lower extremity (deep venous thrombosis) (HCC)    Dysmenorrhea    Endometriosis    Esophageal stricture    G6PD deficiency    GERD (gastroesophageal reflux disease)    History of kidney stones    History of uterine fibroid    IBS (irritable bowel syndrome)    Interstitial cystitis    Lactose intolerance    Migraine    with aura   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   PONV (postoperative nausea and vomiting)    Recurrent upper respiratory infection (URI)    Sebaceous cyst of breast, right lower inner quadrant 10/25/2012   Excised 11/21/12    Sleep apnea    Syncope, non cardiac    Past Surgical History:  Procedure Laterality Date   ARTHROSCOPIC HAGLUNDS REPAIR     BREAST CYST EXCISION Right 11/21/2012   Procedure: CYST EXCISION BREAST;  Surgeon: Currie Paris, MD;  Location: [facility];  Service: General;  Laterality: Right;   BREAST CYST EXCISION Bilateral 01/18/2022   Procedure: EXCISION OF SEBACEOUS CYST BILATERAL BREAST;  Surgeon: Harriette Bouillon, MD;  Location: [facility];  Service: General;  Laterality: Bilateral;   BREAST EXCISIONAL BIOPSY Right 11/2012   CESAREAN  SECTION  04/19/1993   CHOLECYSTECTOMY     COLONOSCOPY  05/02/2011   Procedure: COLONOSCOPY;  Surgeon: Vertell Novak., MD;  Location: [facility];  Service: Endoscopy;  Laterality: N/A;   colonoscopy  11/04/2014   DENTAL SURGERY  03/06/2018   2 surgeries ( 03/06/2018 and 04/06/2018)   to remove two separate benign tumors   ESOPHAGEAL MANOMETRY N/A 11/04/2015   Procedure: ESOPHAGEAL MANOMETRY (EM);  Surgeon: Carman Ching, MD;  Location: [facility];  Service: Endoscopy;  Laterality: N/A;   ESOPHAGOGASTRODUODENOSCOPY  05/02/2011   Procedure: ESOPHAGOGASTRODUODENOSCOPY (EGD);  Surgeon: Vertell Novak., MD;  Location: [facility];  Service: Endoscopy;  Laterality: N/A;   ESOPHAGOGASTRODUODENOSCOPY N/A 09/01/2014   Procedure: ESOPHAGOGASTRODUODENOSCOPY (EGD);  Surgeon: Carman Ching, MD;  Location: [facility];  Service: Endoscopy;  Laterality: N/A;   KNEE SURGERY     LAPAROSCOPY     x 4   LESION REMOVAL N/A 01/18/2022   Procedure: EXCISION OF SEBACEOUS CYSTS CHEST AND NECK;  Surgeon: Harriette Bouillon, MD;  Location: [facility];  Service: General;  Laterality: N/A;   PH IMPEDANCE STUDY N/A 11/04/2015   Procedure: PH IMPEDANCE STUDY;  Surgeon: Carman Ching, MD;  Location: [facility];  Service: Endoscopy;  Laterality: N/A;   VAGINAL  HYSTERECTOMY  2010   TVH--ovaries remain   Patient Active Problem List   Diagnosis Date Noted   Sjogren syndrome with keratoconjunctivitis (HCC) 12/09/2022   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   Elevated LFTs 04/04/2021   Colitis 04/04/2021   Bacterial overgrowth syndrome 05/21/2020   Obesity 05/21/2020   History of endometriosis 05/21/2020   Constipation due to outlet dysfunction 02/05/2020   Gastroesophageal reflux disease 02/05/2020   Obstructive sleep apnea treated with continuous positive airway pressure (CPAP) 06/16/2017   Perennial allergic rhinitis with a nonallergic component 01/31/2017   Asthmatic bronchitis 01/31/2017   GI  symptoms 01/31/2017   Family history of colon cancer 01/27/2015   Chest pain 12/13/2012   Dyspnea 12/13/2012   DVT of lower extremity (deep venous thrombosis) (HCC) 01/03/1990    PCP: Coral Else, MD  REFERRING PROVIDER: Lorenda Ishihara, MD  REFERRING DIAG: I89.0  THERAPY DIAG:  Lymphedema, not elsewhere classified  Rationale for Evaluation and Treatment: Rehabilitation  ONSET DATE: ~2014; exacerbation in 2023  SUBJECTIVE:                                                                                                                                                                                          SUBJECTIVE STATEMENT:  Pt presents for OT to address lower quadrant and LLE lymphedema. Pt does not numerically rate pain/ discomfort today. Pt reports that the nature of the lesion excised by dermatologist recently is inconclusive.   PERTINENT HISTORY: CVI, OSA (no CPAP), GERD, Esophageal stricture, IBS, Lactose intolerant, Diarrhea, Hx LE DVT  PAIN:  Are you having pain? Yes: NPRS scale: see SUBJECTIVE not rated/10 Pain location: LLE, digestive discomfort Pain description: myalgia, tight, heavy, tingling, numbness Aggravating factors: standing, walking, dependent sitting > 30 minutes Relieving factors: elevation  PRECAUTIONS: Other: LYMPHEDEMA; Hx LLE DVT  RED FLAGS: Hx LLE DVT  WEIGHT BEARING RESTRICTIONS: No  FALLS:  Has patient fallen in last 6 months? No  LIVING ENVIRONMENT: Lives with: lives with spouse and daughter Lives in: House/apartment Stairs: Yes; Internal: 14 steps; yes and External: 4 steps; yes Has following equipment at home: None  OCCUPATION: [employer] Services operational officer, walking, desk work  LEISURE: family time  HAND DOMINANCE: right   PRIOR LEVEL OF FUNCTION: Independent  PATIENT GOALS: Feel better, be able to be more active without pain, wear preferred street shoes  OBJECTIVE: Note: Objective measures were completed at Evaluation unless  otherwise noted.  COGNITION:  Overall cognitive status: Within functional limits for tasks assessed   POSTURE: WFL  LE ROM: WFL  LE MMT: WFL  LYMPHEDEMA ASSESSMENTS: non-Ca related  INFECTIONS: denies cellulitis and wound hx  BLE  COMPARATIVE LIMB VOLUMETRICS Initial 11/15/22  LANDMARK RIGHT (dominant)  R LEG (A-D) 3028.9 ml  R THIGH (E-G) 4809.60 ml  R FULL LIMB (A-G) 7838.5 ml  Limb Volume differential (LVD)  %  Volume change since initial %  Volume change overall V  (Blank rows = not tested)  LANDMARK LEFT  (Rx)  L LEG (A-D) 3189.9 ml  L THIGH (E-G) 4959.8 ml  L FULL LIMB (A-G) 8149.7 ml  Limb Volume differential (LVD)  LEG LVD = 5.32%, L>R; THIGH LVD = 3.12%, L>R; And full LLE LVD = 3.97%, L>R.   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  Skin  Description Hyper-Keratosis Peau d' Orange Shiny Tight Fibrotic/ Indurated Fatty Doughy Spongy/ boggy       R>L x  x   Skin dry Flaky WNL Macerated   mildly      Color Redness Varicosities Blanching Hemosiderin Stain Mottled        x   Odor Malodorous Yeast Fungal infection  WNL      x   Temperature Warm Cool wnl     x    Pitting Edema   1+ 2+ 3+ 4+ Non-pitting         x   Girth Symmetrical Asymmetrical                   Distribution    L>R toes to groin    Stemmer Sign Positive Negative   +L -R   Lymphorrhea History Of:  Present Absent     x    Wounds History Of Present Absent Venous Arterial Pressure Sheer     x        Signs of Infection Redness Warmth Erythema Acute Swelling Drainage Borders                     Sensation Light Touch Deep pressure Hypersensitivity   In tact Impaired In tact Impaired Absent Impaired   x  x  x     Nails WNL   Fungus nail dystrophy   x     Hair Growth Symmetrical Asymmetrical   x    Skin Creases Base of toes  Ankles   Base of Fingers knees       Abdominal pannus Thigh Lobules  Face/neck   x           GAIT: Distance walked: >500' Assistive device utilized: None Level of assistance: Complete Independence Comments: Functional ambulation and transfers [facility]  LYMPHEDEMA LIFE IMPACT SCALE (LLIS): Initial 11/15/22  50%  FOTO functional outcome measure: Deferred. FOTO discontinued.  TODAY'S TREATMENT:  MLD to BLQ emphasis on colon and LLE Pt edu for le SELF CARE  PATIENT EDUCATION:  Continued Pt/ CG edu for how function of cisterna chyli, part of the thoracic duct of deep abdominal lymphatics, assists with digestion by filtering many of the fats and proteins from the digestive system. Sub optimal lymphatic flow in this region may result in constipation, bloating, swelling, etc. MLD helps mobilize these protein and fats , and the rhythmic movement of manual lymphatic drainage stimulates digestive muscles and increase peristalsis. Provided printed resource for reference.  Topics include outcome of comparative limb volumetrics- starting limb volume differentials (LVDs), technology and gradient techniques used for short stretch, multilayer compression wrapping, simple self-MLD, therapeutic lymphatic pumping exercises, skin/nail care, LE precautions,. compression garment recommendations and specifications, wear and care schedule and compression garment donning / doffing w assistive devices. Discussed progress towards all OT goals since commencing CDT. All questions answered to the Pt's satisfaction. Good return. Person  educated: Patient  Education method: Explanation, Demonstration, and Handouts Education comprehension: verbalized understanding, returned demonstration, verbal cues required, and needs further education  HOME EXERCISE PROGRAM: BLE lymphatic pumping there ex using- 1 sets of 10 reps, each exercise in order-  1-2 x daily, bilaterally Simple self MLD 1 x daily Daily skin care to increase hydration, skin mobility and decrease infection risk- can be done during MLD Compression wraps 23/7 during intensive phase of CDT Compression garments/ devices during self-management phase of CDT  ASSESSMENT:  CLINICAL IMPRESSION:    Pt tolerated abdominal MLD and MLD to LLE/LLQ without increased pain.  Performed J strokes to descending, transverse and ascending colon region , then utilized deep abdominal lymphatic pathways, including alternate pressure and release on descending, then repeated transverse, then ascending colon and diaphragmatic breathing. Provided MLD to LLE as established with good tolerance. Pt  continues to benefit from skilled OT for Lymphedema care to reduce pain and digestive discomfort. Cont as per POC.  11/15/22 Initial Evaluation: Megyn Leng is a 53 y.o. female presenting with very mild, stage II, LLE lymphedema 2/2 venous insufficiency and obesity. L Leg swelling and associated pain swelling fluctuates. It has progressed over time, and now no longer resolves with elevation over night. RLE swelling limits balance during functional ambulation. It exacerbates infection and falls risk. LLE/RLQ lymphedema interferes with functional performance in all occupational domains, including basic and instrumental ADLs, productive activities, leisure pursuits and social participation. Pt will benefit from Occupational Therapy for Complete Decongestive Therapy (CDT) to restore function, to reduce physical and psychologic suffering associated with chronic, progressive lymphedema and associated pain, and to  limit infection. CDT will include manual lymphatic drainage (MLD), skin care, therapeutic exercise and compression wraps and garment's devices. Without skilled OT for lymphedema care the condition will worsen and further functional decline is expected  Custom-made gradient compression garments and HOS devices are medically necessary because they are uniquely sized and shaped to fit the exact dimensions of the affected extremities, and to provide appropriate medical grade, graduated compression essential for optimally managing chronic, progressive lymphedema. Multiple custom compression garments are needed to ensure proper hygiene to limit infection risk. Custom compression garments should be replaced q 3-6 months When worn consistently for optimal lipo-lymphedema self-management over time. HOS devices, medically necessary to limit fibrosis buildup in tissue, should be replaced q 2 years and PRN when worn out.      OBJECTIVE IMPAIRMENTS: decreased activity tolerance, decreased knowledge of condition, decreased knowledge of use of DME, increased edema, impaired sensation, pain,  and chronic, progressive leg swelling .   ACTIVITY LIMITATIONS: dependent sitting, extended standing and/ or walking, squatting, and lower body dressing, fitting preferred street shoes  PARTICIPATION LIMITATIONS: shopping, community activity, occupation, and yard work  PERSONAL FACTORS: 3+ comorbidities: Hx DVT,  OSA, CVI. Varicose veins  are also affecting patient's functional outcome.   REHAB POTENTIAL: Good  EVALUATION COMPLEXITY: Moderate   GOALS: Goals reviewed with patient? Yes  SHORT TERM GOALS: Target date: 4th OT Rx visit  SHORT TERM GOALS: Target date: 4th OT Rx visit   Pt will demonstrate understanding of lymphedema precautions and prevention strategies with modified independence using a printed reference to identify at least 5 precautions and discussing how s/he may implement them into daily life to reduce  risk of progression with extra time. Baseline:Max A Goal status: GOAL MET   Pt will be able to apply multilayer, knee length, gradient, compression wraps to one leg at a time with modified assistance (extra time and assistive device/s) to decrease limb volume, to limit infection risk, and to limit lymphedema progression.  Baseline: Dependent Goal status: GOAL DISCHARGED. Pt Going to compression garments instead  LONG TERM GOALS: Target date: 02/08/23  Given this patient's Intake score of 67%/100% on the functional outcomes FOTO tool, patient will experience an increase in function of 5 points to improve basic and instrumental ADLs performance, including lymphedema self-care.  Baseline: Max A Goal status:GOAL DISCHARGED.  FOTO TOOL DISCONTINUED AT CLINIC   Given this patient's Intake score of 50% on the Lymphedema Life Impact Scale (LLIS), patient will experience a reduction of at least 5 points in her perceived level of functional impairment resulting from lymphedema to improve functional performance and quality of life (QOL). Baseline: 50% Goal status: PROGRESSING  Pt will achieve at least a 10% volume reduction in B legs to return limb to typical size and shape, to limit infection risk and LE progression, to decrease pain, to improve function. Baseline: Dependent Goal status: PROGRESSING  4.  Pt will obtain proper compression garments/devices and achieve modified independence (extra time + assistive devices) with donning/doffing to optimize limb volume reductions and limit LE  progression over time. Baseline:  Goal status: ONGOING.  5.  During Intensive phase CDT , with modified independence, Pt will achieve at least 85% compliance with all lymphedema self-care home program components, including daily skin care, compression wraps and /or garments, simple self MLD and lymphatic pumping therex to habituate LE self care protocol  into ADLs for optimal LE self-management over time. Baseline:  Dependent Goal status: PROGRESSING   PLAN:  OT FREQUENCY: 2x/week  OT DURATION: 12 weeks  PLANNED INTERVENTIONS: 97110-Therapeutic exercises, 97530- Therapeutic activity, 97535- Self Care, [id number]- Manual therapy, Patient/Family education, Manual lymph drainage, Compression bandaging, and fit with appropriate compression garments  PLAN FOR NEXT SESSION:  Cont MLD Cont Pt edu for LE self-care  Loel Dubonnet, MS, OTR/L, CLT-LANA 04/12/23 8:58 AM

## 2023-04-13 DIAGNOSIS — N200 Calculus of kidney: Secondary | ICD-10-CM | POA: Diagnosis not present

## 2023-04-13 DIAGNOSIS — R350 Frequency of micturition: Secondary | ICD-10-CM | POA: Diagnosis not present

## 2023-04-14 ENCOUNTER — Ambulatory Visit: Admitting: Occupational Therapy

## 2023-04-14 ENCOUNTER — Encounter: Payer: Self-pay | Admitting: Occupational Therapy

## 2023-04-14 DIAGNOSIS — I890 Lymphedema, not elsewhere classified: Secondary | ICD-10-CM

## 2023-04-14 NOTE — Therapy (Signed)
 OUTPATIENT OCCUPATIONAL THERAPY TREATMENT NOTE   L LOWER EXTREMITY/ B LOWER QUADRANT LYMPHEDEMA  Patient Name: Karen [name] MRN: [id number] DOB:[birth year], 53 y.o., female Today's Date: 04/14/2023  END OF SESSION:   OT End of Session - 04/14/23 1336     Visit Number 34    Number of Visits 36    Date for OT Re-Evaluation 05/17/23    OT Start Time 1107    OT Stop Time 1207    OT Time Calculation (min) 60 min    Activity Tolerance Patient tolerated treatment well;No increased pain    Behavior During Therapy [facility] for tasks assessed/performed               Past Medical History:  Diagnosis Date   Adenomyosis    Anemia    Arthritis 2013   L knee gets steroid injections    Asthmatic bronchitis 01/31/2017   Back pain    Chronic constipation    Diarrhea    DVT of lower extremity (deep venous thrombosis) (HCC)    Dysmenorrhea    Endometriosis    Esophageal stricture    G6PD deficiency    GERD (gastroesophageal reflux disease)    History of kidney stones    History of uterine fibroid    IBS (irritable bowel syndrome)    Interstitial cystitis    Lactose intolerance    Migraine    with aura   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   PONV (postoperative nausea and vomiting)    Recurrent upper respiratory infection (URI)    Sebaceous cyst of breast, right lower inner quadrant 10/25/2012   Excised 11/21/12    Sleep apnea    Syncope, non cardiac    Past Surgical History:  Procedure Laterality Date   ARTHROSCOPIC HAGLUNDS REPAIR     BREAST CYST EXCISION Right 11/21/2012   Procedure: CYST EXCISION BREAST;  Surgeon: Currie Paris, MD;  Location: [facility];  Service: General;  Laterality: Right;   BREAST CYST EXCISION Bilateral 01/18/2022   Procedure: EXCISION OF SEBACEOUS CYST BILATERAL BREAST;  Surgeon: Harriette Bouillon, MD;  Location: [facility];  Service: General;  Laterality: Bilateral;   BREAST EXCISIONAL BIOPSY Right 11/2012    CESAREAN SECTION  04/19/1993   CHOLECYSTECTOMY     COLONOSCOPY  05/02/2011   Procedure: COLONOSCOPY;  Surgeon: Vertell Novak., MD;  Location: [facility];  Service: Endoscopy;  Laterality: N/A;   colonoscopy  11/04/2014   DENTAL SURGERY  03/06/2018   2 surgeries ( 03/06/2018 and 04/06/2018)   to remove two separate benign tumors   ESOPHAGEAL MANOMETRY N/A 11/04/2015   Procedure: ESOPHAGEAL MANOMETRY (EM);  Surgeon: Carman Ching, MD;  Location: [facility];  Service: Endoscopy;  Laterality: N/A;   ESOPHAGOGASTRODUODENOSCOPY  05/02/2011   Procedure: ESOPHAGOGASTRODUODENOSCOPY (EGD);  Surgeon: Vertell Novak., MD;  Location: [facility];  Service: Endoscopy;  Laterality: N/A;   ESOPHAGOGASTRODUODENOSCOPY N/A 09/01/2014   Procedure: ESOPHAGOGASTRODUODENOSCOPY (EGD);  Surgeon: Carman Ching, MD;  Location: [facility];  Service: Endoscopy;  Laterality: N/A;   KNEE SURGERY     LAPAROSCOPY     x 4   LESION REMOVAL N/A 01/18/2022   Procedure: EXCISION OF SEBACEOUS CYSTS CHEST AND NECK;  Surgeon: Harriette Bouillon, MD;  Location: [facility];  Service: General;  Laterality: N/A;   PH IMPEDANCE STUDY N/A 11/04/2015   Procedure: PH IMPEDANCE STUDY;  Surgeon: Carman Ching, MD;  Location: [facility];  Service: Endoscopy;  Laterality: N/A;   VAGINAL  HYSTERECTOMY  2010   TVH--ovaries remain   Patient Active Problem List   Diagnosis Date Noted   Sjogren syndrome with keratoconjunctivitis (HCC) 12/09/2022   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   Elevated LFTs 04/04/2021   Colitis 04/04/2021   Bacterial overgrowth syndrome 05/21/2020   Obesity 05/21/2020   History of endometriosis 05/21/2020   Constipation due to outlet dysfunction 02/05/2020   Gastroesophageal reflux disease 02/05/2020   Obstructive sleep apnea treated with continuous positive airway pressure (CPAP) 06/16/2017   Perennial allergic rhinitis with a nonallergic component 01/31/2017   Asthmatic bronchitis  01/31/2017   GI symptoms 01/31/2017   Family history of colon cancer 01/27/2015   Chest pain 12/13/2012   Dyspnea 12/13/2012   DVT of lower extremity (deep venous thrombosis) (HCC) 01/03/1990    PCP: Coral Else, MD  REFERRING PROVIDER: Lorenda Ishihara, MD  REFERRING DIAG: I89.0  THERAPY DIAG:  Lymphedema, not elsewhere classified  Rationale for Evaluation and Treatment: Rehabilitation  ONSET DATE: ~2014; exacerbation in 2023  SUBJECTIVE:                                                                                                                                                                                          SUBJECTIVE STATEMENT:  Pt presents for OT to address lower quadrant and LLE lymphedema. Pt does not numerically rate pain/ discomfort today. Pt reports that the nature of the lesion excised by dermatologist recently is inconclusive.   PERTINENT HISTORY: CVI, OSA (no CPAP), GERD, Esophageal stricture, IBS, Lactose intolerant, Diarrhea, Hx LE DVT  PAIN:  Are you having pain? Yes: NPRS scale: see SUBJECTIVE not rated/10 Pain location: LLE, digestive discomfort Pain description: myalgia, tight, heavy, tingling, numbness Aggravating factors: standing, walking, dependent sitting > 30 minutes Relieving factors: elevation  PRECAUTIONS: Other: LYMPHEDEMA; Hx LLE DVT  RED FLAGS: Hx LLE DVT  WEIGHT BEARING RESTRICTIONS: No  FALLS:  Has patient fallen in last 6 months? No  LIVING ENVIRONMENT: Lives with: lives with spouse and daughter Lives in: House/apartment Stairs: Yes; Internal: 14 steps; yes and External: 4 steps; yes Has following equipment at home: None  OCCUPATION: [employer] operational officer, walking, desk work  LEISURE: family time  HAND DOMINANCE: right   PRIOR LEVEL OF FUNCTION: Independent  PATIENT GOALS: Feel better, be able to be more active without pain, wear preferred street shoes  OBJECTIVE: Note: Objective measures were completed at  Evaluation unless otherwise noted.  COGNITION:  Overall cognitive status: Within functional limits for tasks assessed   POSTURE: WFL  LE ROM: WFL  LE MMT: WFL  LYMPHEDEMA ASSESSMENTS: non-Ca related  INFECTIONS: denies cellulitis and wound hx  BLE  COMPARATIVE LIMB VOLUMETRICS Initial 11/15/22  LANDMARK RIGHT (dominant)  R LEG (A-D) 3028.9 ml  R THIGH (E-G) 4809.60 ml  R FULL LIMB (A-G) 7838.5 ml  Limb Volume differential (LVD)  %  Volume change since initial %  Volume change overall V  (Blank rows = not tested)  LANDMARK LEFT  (Rx)  L LEG (A-D) 3189.9 ml  L THIGH (E-G) 4959.8 ml  L FULL LIMB (A-G) 8149.7 ml  Limb Volume differential (LVD)  LEG LVD = 5.32%, L>R; THIGH LVD = 3.12%, L>R; And full LLE LVD = 3.97%, L>R.   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  Skin  Description Hyper-Keratosis Peau d' Orange Shiny Tight Fibrotic/ Indurated Fatty Doughy Spongy/ boggy       R>L x  x   Skin dry Flaky WNL Macerated   mildly      Color Redness Varicosities Blanching Hemosiderin Stain Mottled        x   Odor Malodorous Yeast Fungal infection  WNL      x   Temperature Warm Cool wnl     x    Pitting Edema   1+ 2+ 3+ 4+ Non-pitting         x   Girth Symmetrical Asymmetrical                   Distribution    L>R toes to groin    Stemmer Sign Positive Negative   +L -R   Lymphorrhea History Of:  Present Absent     x    Wounds History Of Present Absent Venous Arterial Pressure Sheer     x        Signs of Infection Redness Warmth Erythema Acute Swelling  Drainage Borders                    Sensation Light Touch Deep pressure Hypersensitivity   In tact Impaired In tact Impaired Absent Impaired   x  x  x     Nails WNL   Fungus nail dystrophy   x     Hair Growth Symmetrical Asymmetrical   x    Skin Creases Base of toes  Ankles   Base of Fingers knees       Abdominal pannus Thigh Lobules  Face/neck   x           GAIT: Distance walked: >500' Assistive device utilized: None Level of assistance: Complete Independence Comments: Functional ambulation and transfers [facility]  LYMPHEDEMA LIFE IMPACT SCALE (LLIS): Initial 11/15/22  50%  FOTO functional outcome measure: Deferred. FOTO discontinued.  TODAY'S TREATMENT:  MLD to BLQ emphasis on colon and LLE Pt edu for le SELF CARE  PATIENT EDUCATION:  Continued Pt/ CG edu for how function of cisterna chyli, part of the thoracic duct of deep abdominal lymphatics, assists with digestion by filtering many of the fats and proteins from the digestive system. Sub optimal lymphatic flow in this region may result in constipation, bloating, swelling, etc. MLD helps mobilize these protein and fats , and the rhythmic movement of manual lymphatic drainage stimulates digestive muscles and increase peristalsis. Provided printed resource for reference.  Topics include outcome of comparative limb volumetrics- starting limb volume differentials (LVDs), technology and gradient techniques used for short stretch, multilayer compression wrapping, simple self-MLD, therapeutic lymphatic pumping exercises, skin/nail care, LE precautions,. compression garment recommendations and specifications, wear and care schedule and compression garment donning / doffing w assistive devices. Discussed progress towards all OT goals since commencing CDT. All questions answered to the Pt's satisfaction.  Good return. Person educated: Patient  Education method: Explanation, Demonstration, and Handouts Education comprehension: verbalized understanding, returned demonstration, verbal cues required, and needs further education  HOME EXERCISE PROGRAM: BLE lymphatic pumping there ex using- 1 sets of 10 reps, each exercise in order-  1-2 x daily, bilaterally Simple self MLD 1 x daily Daily skin care to increase hydration, skin mobility and decrease infection risk- can be done during MLD Compression wraps 23/7 during intensive phase of CDT Compression garments/ devices during self-management phase of CDT  ASSESSMENT:  CLINICAL IMPRESSION:    Pt tolerating and responding well to CDT for deep abdominal lymphatics and for LLE, although the focus of Rx has shifted more towards reducing gut symptoms and LLE is relatively stable. Pt reporting she has not needed a colon cleanse for 1.5 months and w 1-2 x weekly MLD she no longer needs medication for constipation.  Today Pt tolerated abdominal MLD without increased pain.  Performed J strokes to descending, transverse and ascending colon region , then utilized deep abdominal lymphatic pathways, including alternate pressure and release on descending, then repeated transverse, then ascending colon and diaphragmatic breathing. Provided MLD to LLE as established with good tolerance. Pt  continues to benefit from skilled OT for Lymphedema care to reduce pain and digestive discomfort. Cont  1-2 x weekly as per POC.  11/15/22 Initial Evaluation: Karen [name] is a 53 y.o. female presenting with very mild, stage II, LLE lymphedema 2/2 venous insufficiency and obesity. L Leg swelling and associated pain swelling fluctuates. It has progressed over time, and now no longer resolves with elevation over night. RLE swelling limits balance during functional ambulation. It exacerbates infection and falls risk. LLE/RLQ lymphedema interferes with functional performance in all  occupational domains, including basic and instrumental ADLs, productive activities, leisure pursuits and social participation. Pt will benefit from Occupational Therapy for Complete Decongestive Therapy (CDT) to restore function, to reduce physical and psychologic suffering associated with chronic, progressive lymphedema and associated pain, and to limit infection. CDT will include manual lymphatic drainage (MLD), skin care, therapeutic exercise and compression wraps and garment's devices. Without skilled OT for lymphedema care the condition will worsen and further functional decline is expected  Custom-made gradient compression garments and HOS devices are medically necessary because they are uniquely sized and shaped to fit the exact dimensions of the affected extremities, and to provide appropriate medical grade, graduated compression essential for optimally managing chronic, progressive lymphedema. Multiple custom compression garments are needed to ensure proper hygiene to limit infection risk. Custom compression garments should be replaced  q 3-6 months When worn consistently for optimal lipo-lymphedema self-management over time. HOS devices, medically necessary to limit fibrosis buildup in tissue, should be replaced q 2 years and PRN when worn out.      OBJECTIVE IMPAIRMENTS: decreased activity tolerance, decreased knowledge of condition, decreased knowledge of use of DME, increased edema, impaired sensation, pain, and chronic, progressive leg swelling .   ACTIVITY LIMITATIONS: dependent sitting, extended standing and/ or walking, squatting, and lower body dressing, fitting preferred street shoes  PARTICIPATION LIMITATIONS: shopping, community activity, occupation, and yard work  PERSONAL FACTORS: 3+ comorbidities: Hx DVT,  OSA, CVI. Varicose veins  are also affecting patient's functional outcome.   REHAB POTENTIAL: Good  EVALUATION COMPLEXITY: Moderate   GOALS: Goals reviewed with patient?  Yes  SHORT TERM GOALS: Target date: 4th OT Rx visit  SHORT TERM GOALS: Target date: 4th OT Rx visit   Pt will demonstrate understanding of lymphedema precautions and prevention strategies with modified independence using a printed reference to identify at least 5 precautions and discussing how s/he may implement them into daily life to reduce risk of progression with extra time. Baseline:Max A Goal status: GOAL MET   Pt will be able to apply multilayer, knee length, gradient, compression wraps to one leg at a time with modified assistance (extra time and assistive device/s) to decrease limb volume, to limit infection risk, and to limit lymphedema progression.  Baseline: Dependent Goal status: GOAL DISCHARGED. Pt Going to compression garments instead  LONG TERM GOALS: Target date: 02/08/23  Given this patient's Intake score of 67%/100% on the functional outcomes FOTO tool, patient will experience an increase in function of 5 points to improve basic and instrumental ADLs performance, including lymphedema self-care.  Baseline: Max A Goal status:GOAL DISCHARGED.  FOTO TOOL DISCONTINUED AT CLINIC   Given this patient's Intake score of 50% on the Lymphedema Life Impact Scale (LLIS), patient will experience a reduction of at least 5 points in her perceived level of functional impairment resulting from lymphedema to improve functional performance and quality of life (QOL). Baseline: 50% Goal status: PROGRESSING  Pt will achieve at least a 10% volume reduction in B legs to return limb to typical size and shape, to limit infection risk and LE progression, to decrease pain, to improve function. Baseline: Dependent Goal status: PROGRESSING  4.  Pt will obtain proper compression garments/devices and achieve modified independence (extra time + assistive devices) with donning/doffing to optimize limb volume reductions and limit LE  progression over time. Baseline:  Goal status: ONGOING.  5.  During  Intensive phase CDT , with modified independence, Pt will achieve at least 85% compliance with all lymphedema self-care home program components, including daily skin care, compression wraps and /or garments, simple self MLD and lymphatic pumping therex to habituate LE self care protocol  into ADLs for optimal LE self-management over time. Baseline: Dependent Goal status: PROGRESSING   PLAN:  OT FREQUENCY: 2x/week  OT DURATION: 12 weeks  PLANNED INTERVENTIONS: 97110-Therapeutic exercises, 97530- Therapeutic activity, 97535- Self Care, [id number]- Manual therapy, Patient/Family education, Manual lymph drainage, Compression bandaging, and fit with appropriate compression garments  PLAN FOR NEXT SESSION:  Cont MLD Cont Pt edu for LE self-care  Loel Dubonnet, MS, OTR/L, CLT-LANA 04/14/23 1:38 PM

## 2023-04-17 ENCOUNTER — Ambulatory Visit: Admitting: Occupational Therapy

## 2023-04-17 ENCOUNTER — Encounter: Payer: Self-pay | Admitting: Occupational Therapy

## 2023-04-17 DIAGNOSIS — I890 Lymphedema, not elsewhere classified: Secondary | ICD-10-CM

## 2023-04-17 NOTE — Therapy (Signed)
 OUTPATIENT OCCUPATIONAL THERAPY TREATMENT NOTE   L LOWER EXTREMITY/ B LOWER QUADRANT LYMPHEDEMA  Patient Name: Karen [name] MRN: [id number] DOB:01/24/1970, 53 y.o., female Today's Date: 04/17/2023  END OF SESSION:   OT End of Session - 04/17/23 0908     Visit Number 34    Number of Visits 36    Date for OT Re-Evaluation 05/17/23    OT Start Time 0902    OT Stop Time 1003    OT Time Calculation (min) 61 min    Activity Tolerance Patient tolerated treatment well;No increased pain    Behavior During Therapy [facility] for tasks assessed/performed               Past Medical History:  Diagnosis Date   Adenomyosis    Anemia    Arthritis 2013   L knee gets steroid injections    Asthmatic bronchitis 01/31/2017   Back pain    Chronic constipation    Diarrhea    DVT of lower extremity (deep venous thrombosis) (HCC)    Dysmenorrhea    Endometriosis    Esophageal stricture    G6PD deficiency    GERD (gastroesophageal reflux disease)    History of kidney stones    History of uterine fibroid    IBS (irritable bowel syndrome)    Interstitial cystitis    Lactose intolerance    Migraine    with aura   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   PONV (postoperative nausea and vomiting)    Recurrent upper respiratory infection (URI)    Sebaceous cyst of breast, right lower inner quadrant 10/25/2012   Excised 11/21/12    Sleep apnea    Syncope, non cardiac    Past Surgical History:  Procedure Laterality Date   ARTHROSCOPIC HAGLUNDS REPAIR     BREAST CYST EXCISION Right 11/21/2012   Procedure: CYST EXCISION BREAST;  Surgeon: Currie Paris, MD;  Location: [facility];  Service: General;  Laterality: Right;   BREAST CYST EXCISION Bilateral 01/18/2022   Procedure: EXCISION OF SEBACEOUS CYST BILATERAL BREAST;  Surgeon: Harriette Bouillon, MD;  Location: [facility];  Service: General;  Laterality: Bilateral;   BREAST EXCISIONAL BIOPSY Right 11/2012    CESAREAN SECTION  04/19/1993   CHOLECYSTECTOMY     COLONOSCOPY  05/02/2011   Procedure: COLONOSCOPY;  Surgeon: Vertell Novak., MD;  Location: [facility];  Service: Endoscopy;  Laterality: N/A;   colonoscopy  11/04/2014   DENTAL SURGERY  03/06/2018   2 surgeries ( 03/06/2018 and 04/06/2018)   to remove two separate benign tumors   ESOPHAGEAL MANOMETRY N/A 11/04/2015   Procedure: ESOPHAGEAL MANOMETRY (EM);  Surgeon: Carman Ching, MD;  Location: [facility];  Service: Endoscopy;  Laterality: N/A;   ESOPHAGOGASTRODUODENOSCOPY  05/02/2011   Procedure: ESOPHAGOGASTRODUODENOSCOPY (EGD);  Surgeon: Vertell Novak., MD;  Location: [facility];  Service: Endoscopy;  Laterality: N/A;   ESOPHAGOGASTRODUODENOSCOPY N/A 09/01/2014   Procedure: ESOPHAGOGASTRODUODENOSCOPY (EGD);  Surgeon: Carman Ching, MD;  Location: [facility];  Service: Endoscopy;  Laterality: N/A;   KNEE SURGERY     LAPAROSCOPY     x 4   LESION REMOVAL N/A 01/18/2022   Procedure: EXCISION OF SEBACEOUS CYSTS CHEST AND NECK;  Surgeon: Harriette Bouillon, MD;  Location:  S[facility]ERY [facility];  Service: General;  Laterality: N/A;   PH IMPEDANCE STUDY N/A 11/04/2015   Procedure: PH IMPEDANCE STUDY;  Surgeon: Carman Ching, MD;  Location: [facility];  Service: Endoscopy;  Laterality: N/A;   VAGINAL  HYSTERECTOMY  2010   TVH--ovaries remain   Patient Active Problem List   Diagnosis Date Noted   Sjogren syndrome with keratoconjunctivitis (HCC) 12/09/2022   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   Elevated LFTs 04/04/2021   Colitis 04/04/2021   Bacterial overgrowth syndrome 05/21/2020   Obesity 05/21/2020   History of endometriosis 05/21/2020   Constipation due to outlet dysfunction 02/05/2020   Gastroesophageal reflux disease 02/05/2020   Obstructive sleep apnea treated with continuous positive airway pressure (CPAP) 06/16/2017   Perennial allergic rhinitis with a nonallergic component 01/31/2017   Asthmatic bronchitis  01/31/2017   GI symptoms 01/31/2017   Family history of colon cancer 01/27/2015   Chest pain 12/13/2012   Dyspnea 12/13/2012   DVT of lower extremity (deep venous thrombosis) (HCC) 01/03/1990    PCP: Genny Kid, MD  REFERRING PROVIDER: Arva Lathe, MD  REFERRING DIAG: I89.0  THERAPY DIAG:  Lymphedema, not elsewhere classified  Rationale for Evaluation and Treatment: Rehabilitation  ONSET DATE: ~2014; exacerbation in 2023  SUBJECTIVE:                                                                                                                                                                                          SUBJECTIVE STATEMENT:  Pt presents for OT to address lower quadrant and LLE lymphedema. Pt describes leg pain as "achy" and rates as 2/10. Pt desribes abdominal discomfort as "fullness" at 4/10. Pt states she has BM most days, but continues to have discomfort daily.  "Ive been really tired too."   PERTINENT HISTORY: CVI, OSA (no CPAP), GERD, Esophageal stricture, IBS, Lactose intolerant, Diarrhea, Hx LE DVT  PAIN:  Are you having pain? Yes: Pls see SUBJECTIVE Pain location: LLE, digestive discomfort Pain description: myalgia, tight, heavy, tingling, numbness Aggravating factors: standing, walking, dependent sitting > 30 minutes Relieving factors: elevation  PRECAUTIONS: Other: LYMPHEDEMA; Hx LLE DVT  RED FLAGS: Hx LLE DVT  WEIGHT BEARING RESTRICTIONS: No  FALLS:  Has patient fallen in last 6 months? No  LIVING ENVIRONMENT: Lives with: lives with spouse and daughter Lives in: House/apartment Stairs: Yes; Internal: 14 steps; yes and External: 4 steps; yes Has following equipment at home: None  OCCUPATION: [employer] operational officer, walking, desk work  LEISURE: family time  HAND DOMINANCE: right   PRIOR LEVEL OF FUNCTION: Independent  PATIENT GOALS: Feel better, be able to be more active without pain, wear preferred street  shoes  OBJECTIVE: Note: Objective measures were completed at Evaluation unless otherwise noted.  COGNITION:  Overall cognitive status: Within functional limits for tasks assessed   POSTURE: WFL  LE ROM: WFL  LE MMT: [facility]  LYMPHEDEMA  ASSESSMENTS: non-Ca related  INFECTIONS: denies cellulitis and wound hx  BLE COMPARATIVE LIMB VOLUMETRICS Initial 11/15/22  LANDMARK RIGHT (dominant)  R LEG (A-D) 3028.9 ml  R THIGH (E-G) 4809.60 ml  R FULL LIMB (A-G) 7838.5 ml  Limb Volume differential (LVD)  %  Volume change since initial %  Volume change overall V  (Blank rows = not tested)  LANDMARK LEFT  (Rx)  L LEG (A-D) 3189.9 ml  L THIGH (E-G) 4959.8 ml  L FULL LIMB (A-G) 8149.7 ml  Limb Volume differential (LVD)  LEG LVD = 5.32%, L>R; THIGH LVD = 3.12%, L>R; And full LLE LVD = 3.97%, L>R.   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  Skin  Description Hyper-Keratosis Peau d' Orange Shiny Tight Fibrotic/ Indurated Fatty Doughy Spongy/ boggy       R>L x  x   Skin dry Flaky WNL Macerated   mildly      Color Redness Varicosities Blanching Hemosiderin Stain Mottled        x   Odor Malodorous Yeast Fungal infection  WNL      x   Temperature Warm Cool wnl     x    Pitting Edema   1+ 2+ 3+ 4+ Non-pitting         x   Girth Symmetrical Asymmetrical                   Distribution    L>R toes to groin    Stemmer Sign Positive Negative   +L -R   Lymphorrhea History Of:  Present Absent     x    Wounds History Of Present Absent Venous Arterial Pressure Sheer     x         Signs of Infection Redness Warmth Erythema Acute Swelling Drainage Borders                    Sensation Light Touch Deep pressure Hypersensitivity   In tact Impaired In tact Impaired Absent Impaired   x  x  x     Nails WNL   Fungus nail dystrophy   x     Hair Growth Symmetrical Asymmetrical   x    Skin Creases Base of toes  Ankles   Base of Fingers knees       Abdominal pannus Thigh Lobules  Face/neck   x           GAIT: Distance walked: >500' Assistive device utilized: None Level of assistance: Complete Independence Comments: Functional ambulation and transfers [facility]  LYMPHEDEMA LIFE IMPACT SCALE (LLIS): Initial 11/15/22  50%  FOTO functional outcome measure: Deferred. FOTO discontinued.  TODAY'S TREATMENT:  MLD to BLQ emphasis on colon and LLE Pt edu for le SELF CARE  PATIENT EDUCATION:  Continued Pt/ CG edu for how function of cisterna chyli, part of the thoracic duct of deep abdominal lymphatics, assists with digestion by filtering many of the fats and proteins from the digestive system. Sub optimal lymphatic flow in this region may result in constipation, bloating, swelling, etc. MLD helps mobilize these protein and fats , and the rhythmic movement of manual lymphatic drainage stimulates digestive muscles and increase peristalsis. Provided printed resource for reference.  Topics include outcome of comparative limb volumetrics- starting limb volume differentials (LVDs), technology and gradient techniques used for short stretch, multilayer compression wrapping, simple self-MLD, therapeutic lymphatic pumping exercises, skin/nail care, LE precautions,. compression garment recommendations and specifications, wear and care schedule and compression garment donning / doffing w assistive devices. Discussed progress towards all OT goals since  commencing CDT. All questions answered to the Pt's satisfaction. Good return. Person educated: Patient  Education method: Explanation, Demonstration, and Handouts Education comprehension: verbalized understanding, returned demonstration, verbal cues required, and needs further education  HOME EXERCISE PROGRAM: BLE lymphatic pumping there ex using- 1 sets of 10 reps, each exercise in order-  1-2 x daily, bilaterally Simple self MLD 1 x daily Daily skin care to increase hydration, skin mobility and decrease infection risk- can be done during MLD Compression wraps 23/7 during intensive phase of CDT Compression garments/ devices during self-management phase of CDT  ASSESSMENT:  CLINICAL IMPRESSION:    Pt continues to tolerate and respond well to CDT for deep abdominal lymphatics and for LLE, although the focus of Rx has shifted more towards reducing gut symptoms and LLE is relatively stable. Pt notes that she has not needed a colon cleanse for 1.5 months and w 1-2 x weekly MLD she no longer needs medication for constipation.  Today Pt tolerated abdominal MLD without increased pain.  Performed J strokes to descending, transverse and ascending colon region , then utilized deep abdominal lymphatic pathways, including alternate pressure and release on descending, then repeated transverse, then ascending colon and diaphragmatic breathing. Provided MLD to LLE as established with good tolerance. Pt  continues to benefit from skilled OT for Lymphedema care to reduce pain and digestive discomfort. Cont  1-2 x weekly as per POC.  11/15/22 Initial Evaluation: Tamu Golz is a 53 y.o. female presenting with very mild, stage II, LLE lymphedema 2/2 venous insufficiency and obesity. L Leg swelling and associated pain swelling fluctuates. It has progressed over time, and now no longer resolves with elevation over night. RLE swelling limits balance during functional ambulation. It exacerbates infection and falls  risk. LLE/RLQ lymphedema interferes with functional performance in all occupational domains, including basic and instrumental ADLs, productive activities, leisure pursuits and social participation. Pt will benefit from Occupational Therapy for Complete Decongestive Therapy (CDT) to restore function, to reduce physical and psychologic suffering associated with chronic, progressive lymphedema and associated pain, and to limit infection. CDT will include manual lymphatic drainage (MLD), skin care, therapeutic exercise and compression wraps and garment's devices. Without skilled OT for lymphedema care the condition will worsen and further functional decline is expected  Custom-made gradient compression garments and HOS devices are medically necessary because they are uniquely sized and shaped to fit the exact dimensions of the affected extremities, and to provide appropriate medical grade, graduated compression essential for optimally managing chronic, progressive lymphedema. Multiple custom compression garments are needed to ensure proper hygiene to limit infection risk. Custom compression garments  should be replaced q 3-6 months When worn consistently for optimal lipo-lymphedema self-management over time. HOS devices, medically necessary to limit fibrosis buildup in tissue, should be replaced q 2 years and PRN when worn out.      OBJECTIVE IMPAIRMENTS: decreased activity tolerance, decreased knowledge of condition, decreased knowledge of use of DME, increased edema, impaired sensation, pain, and chronic, progressive leg swelling .   ACTIVITY LIMITATIONS: dependent sitting, extended standing and/ or walking, squatting, and lower body dressing, fitting preferred street shoes  PARTICIPATION LIMITATIONS: shopping, community activity, occupation, and yard work  PERSONAL FACTORS: 3+ comorbidities: Hx DVT,  OSA, CVI. Varicose veins  are also affecting patient's functional outcome.   REHAB POTENTIAL:  Good  EVALUATION COMPLEXITY: Moderate   GOALS: Goals reviewed with patient? Yes  SHORT TERM GOALS: Target date: 4th OT Rx visit  SHORT TERM GOALS: Target date: 4th OT Rx visit   Pt will demonstrate understanding of lymphedema precautions and prevention strategies with modified independence using a printed reference to identify at least 5 precautions and discussing how s/he may implement them into daily life to reduce risk of progression with extra time. Baseline:Max A Goal status: GOAL MET   Pt will be able to apply multilayer, knee length, gradient, compression wraps to one leg at a time with modified assistance (extra time and assistive device/s) to decrease limb volume, to limit infection risk, and to limit lymphedema progression.  Baseline: Dependent Goal status: GOAL DISCHARGED. Pt Going to compression garments instead  LONG TERM GOALS: Target date: 02/08/23  Given this patient's Intake score of 67%/100% on the functional outcomes FOTO tool, patient will experience an increase in function of 5 points to improve basic and instrumental ADLs performance, including lymphedema self-care.  Baseline: Max A Goal status:GOAL DISCHARGED.  FOTO TOOL DISCONTINUED AT CLINIC   Given this patient's Intake score of 50% on the Lymphedema Life Impact Scale (LLIS), patient will experience a reduction of at least 5 points in her perceived level of functional impairment resulting from lymphedema to improve functional performance and quality of life (QOL). Baseline: 50% Goal status: PROGRESSING  Pt will achieve at least a 10% volume reduction in B legs to return limb to typical size and shape, to limit infection risk and LE progression, to decrease pain, to improve function. Baseline: Dependent Goal status: PROGRESSING  4.  Pt will obtain proper compression garments/devices and achieve modified independence (extra time + assistive devices) with donning/doffing to optimize limb volume reductions and  limit LE  progression over time. Baseline:  Goal status: ONGOING.  5.  During Intensive phase CDT , with modified independence, Pt will achieve at least 85% compliance with all lymphedema self-care home program components, including daily skin care, compression wraps and /or garments, simple self MLD and lymphatic pumping therex to habituate LE self care protocol  into ADLs for optimal LE self-management over time. Baseline: Dependent Goal status: PROGRESSING   PLAN:  OT FREQUENCY: 2x/week  OT DURATION: 12 weeks  PLANNED INTERVENTIONS: 97110-Therapeutic exercises, 97530- Therapeutic activity, 97535- Self Care, 16109- Manual therapy, Patient/Family education, Manual lymph drainage, Compression bandaging, and fit with appropriate compression garments  PLAN FOR NEXT SESSION:  Cont MLD Cont Pt edu for LE self-care  Loel Dubonnet, MS, OTR/L, CLT-LANA 04/17/23 12:11 PM

## 2023-04-18 ENCOUNTER — Ambulatory Visit: Payer: Commercial Managed Care - PPO

## 2023-04-18 DIAGNOSIS — R279 Unspecified lack of coordination: Secondary | ICD-10-CM | POA: Diagnosis not present

## 2023-04-18 DIAGNOSIS — R102 Pelvic and perineal pain: Secondary | ICD-10-CM | POA: Diagnosis not present

## 2023-04-18 DIAGNOSIS — M62838 Other muscle spasm: Secondary | ICD-10-CM

## 2023-04-18 DIAGNOSIS — M5459 Other low back pain: Secondary | ICD-10-CM | POA: Diagnosis not present

## 2023-04-18 DIAGNOSIS — M6281 Muscle weakness (generalized): Secondary | ICD-10-CM

## 2023-04-18 NOTE — Therapy (Signed)
 OUTPATIENT PHYSICAL THERAPY FEMALE PELVIC TREATMENT   Patient Name: Karen [name] MRN: [id number] DOB:03-15-1970, 53 y.o., female Today's Date: 04/18/2023  END OF SESSION:  PT End of Session - 04/18/23 1155     Visit Number 37    Date for PT Re-Evaluation 04/26/23    Authorization Type [employer]    PT Start Time 1155    PT Stop Time 1228    PT Time Calculation (min) 33 min    Activity Tolerance Patient tolerated treatment well    Behavior During Therapy [facility] for tasks assessed/performed                      Past Medical History:  Diagnosis Date   Adenomyosis    Anemia    Arthritis 2013   L knee gets steroid injections    Asthmatic bronchitis 01/31/2017   Back pain    Chronic constipation    Diarrhea    DVT of lower extremity (deep venous thrombosis) (HCC)    Dysmenorrhea    Endometriosis    Esophageal stricture    G6PD deficiency    GERD (gastroesophageal reflux disease)    History of kidney stones    History of uterine fibroid    IBS (irritable bowel syndrome)    Interstitial cystitis    Lactose intolerance    Migraine    with aura   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   PONV (postoperative nausea and vomiting)    Recurrent upper respiratory infection (URI)    Sebaceous cyst of breast, right lower inner quadrant 10/25/2012   Excised 11/21/12    Sleep apnea    Syncope, non cardiac    Past Surgical History:  Procedure Laterality Date   ARTHROSCOPIC HAGLUNDS REPAIR     BREAST CYST EXCISION Right 11/21/2012   Procedure: CYST EXCISION BREAST;  Surgeon: Currie Paris, MD;  Location: [facility];  Service: General;  Laterality: Right;   BREAST CYST EXCISION Bilateral 01/18/2022   Procedure: EXCISION OF SEBACEOUS CYST BILATERAL BREAST;  Surgeon: Harriette Bouillon, MD;  Location: [facility];  Service: General;  Laterality: Bilateral;   BREAST EXCISIONAL BIOPSY Right 11/2012   CESAREAN SECTION  04/19/1993    CHOLECYSTECTOMY     COLONOSCOPY  05/02/2011   Procedure: COLONOSCOPY;  Surgeon: Vertell Novak., MD;  Location: [facility];  Service: Endoscopy;  Laterality: N/A;   colonoscopy  11/04/2014   DENTAL SURGERY  03/06/2018   2 surgeries ( 03/06/2018 and 04/06/2018)   to remove two separate benign tumors   ESOPHAGEAL MANOMETRY N/A 11/04/2015   Procedure: ESOPHAGEAL MANOMETRY (EM);  Surgeon: Carman Ching, MD;  Location: [facility];  Service: Endoscopy;  Laterality: N/A;   ESOPHAGOGASTRODUODENOSCOPY  05/02/2011   Procedure: ESOPHAGOGASTRODUODENOSCOPY (EGD);  Surgeon: Vertell Novak., MD;  Location: [facility];  Service: Endoscopy;  Laterality: N/A;   ESOPHAGOGASTRODUODENOSCOPY N/A 09/01/2014   Procedure: ESOPHAGOGASTRODUODENOSCOPY (EGD);  Surgeon: Carman Ching, MD;  Location: [facility];  Service: Endoscopy;  Laterality: N/A;   KNEE SURGERY     LAPAROSCOPY     x 4   LESION REMOVAL N/A 01/18/2022   Procedure: EXCISION OF SEBACEOUS CYSTS CHEST AND NECK;  Surgeon: Harriette Bouillon, MD;  Location: [facility];  Service: General;  Laterality: N/A;   PH IMPEDANCE STUDY N/A 11/04/2015   Procedure: PH IMPEDANCE STUDY;  Surgeon: Carman Ching, MD;  Location: [facility];  Service: Endoscopy;  Laterality: N/A;   VAGINAL HYSTERECTOMY  2010   TVH--ovaries remain   Patient Active Problem List   Diagnosis Date Noted   Sjogren syndrome with keratoconjunctivitis (HCC) 12/09/2022   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   Elevated LFTs 04/04/2021   Colitis 04/04/2021   Bacterial overgrowth syndrome 05/21/2020   Obesity 05/21/2020   History of endometriosis 05/21/2020   Constipation due to outlet dysfunction 02/05/2020   Gastroesophageal reflux disease 02/05/2020   Obstructive sleep apnea treated with continuous positive airway pressure (CPAP) 06/16/2017   Perennial allergic rhinitis with a nonallergic component 01/31/2017   Asthmatic bronchitis 01/31/2017   GI symptoms 01/31/2017    Family history of colon cancer 01/27/2015   Chest pain 12/13/2012   Dyspnea 12/13/2012   DVT of lower extremity (deep venous thrombosis) (HCC) 01/03/1990    PCP: Arva Lathe, MD  REFERRING PROVIDER: Lillian Rein, MD   REFERRING DIAG: M62.89 (ICD-10-CM) - Pelvic floor dysfunction  THERAPY DIAG:  Pelvic pain  Other low back pain  Muscle weakness (generalized)  Other muscle spasm  Unspecified lack of coordination  Rationale for Evaluation and Treatment: Rehabilitation  ONSET DATE: chronic  SUBJECTIVE:                                                                                                                                                                                           SUBJECTIVE STATEMENT:  Colonoscopy/endoscopy schedule for 05/25/23.  Pt states that her stomach has really been acting up, but she did have bowel movement this morning.She has been very fatigued overall and feels like she may need to have her blood checked for iron levels. She states that she almost had to do a cleanse, but then had a bowel movement. She feels like because of this, everything in OT and PT is helping her maintain and avoid having to do cleanses.   She was sore after last treatment session for a couple of days.    PAIN: 04/18/23 Are you having pain? Yes NPRS scale: 3/10 Pain location:  Lower abdominal pain, Lt sided pain, low back pain  Pain type: turning, knotted, pressure Pain description: intermittent and constant   Aggravating factors: constipation or frequent bowel movements/constantly having bowel movements  Relieving factors: exercises (bowel massage, happy baby, spinal twists, walking)  PRECAUTIONS: None  WEIGHT BEARING RESTRICTIONS: No  FALLS:  Has patient fallen in last 6 months? No  LIVING ENVIRONMENT: Lives with: lives with their spouse Lives in: House/apartment  OCCUPATION: nurse, in admin  PLOF: Independent  PATIENT GOALS: decrease pain  and have more normal bowel movements  PERTINENT HISTORY:  Vaginal hysterectomy, DVT LE, endometriosis, interstitial cystitis, exploratory laps for endometriosis, c-section, cholecystectomy  Sexual abuse: No  BOWEL MOVEMENT: Pain with bowel movement: Yes Type of bowel movement:Type (Bristol Stool Scale) 1-7 (sometimes full range in the same bowel movement), Frequency sometimes many times a day, sometimes up to 4 days in between, and Strain Yes Fully empty rectum: No Leakage: No Pads: No Fiber supplement: No - has attempted metamucil and it made constipation worse  URINATION: Pain with urination: Yes Fully empty bladder: No Stream:  varies  Urgency: Yes: all the time Frequency: multiple times an hours  Leakage: Urge to void, Walking to the bathroom, Coughing, Sneezing, Laughing, and Exercise Pads: Yes: daily  INTERCOURSE: Pain with intercourse: Initial Penetration and During Penetration Ability to have vaginal penetration:  Yes: with pain Climax: non painful WNL   PREGNANCY: Vaginal deliveries 1 Tearing Yes: 3rd degree tear C-section deliveries 1 Currently pregnant No  PROLAPSE: Periodically will feel vaginal bulging, heaviness in lower abdomen   OBJECTIVE:  11/09/22: No internal rectal or vaginal pelvic floor exam performed due to high level of skin irritation   Weak transversus abdominus contraction  Abdominal restriction and tenderness in bil lower quadrant  Unable to perform pelvic tilt with appropriate coordination  LUMBARAROM/PROM:  A/PROM A/PROM  Eval (% available) 11/09/22 (% available)  Flexion 50 90  Extension 25, pain anteriorly  25, pain in low back  Right lateral flexion 50, pain anteriorly  75, pain on right  Left lateral flexion 50, pain anteriorly  75, pain on right   (Blank rows = not tested) 07/21/22: standing prolapse assessment demonstrated grade 2 anterior vaginal wall laxity   06/08/22:               External Perineal Exam: WNL                               Internal Pelvic Floor: burning reported with palpation of superficial muscles; deep aching/pressure with palpation of Lt levator ani   Patient confirms identification and approves PT to assess internal pelvic floor and treatment Yes  PELVIC MMT:   MMT eval  Vaginal 3/5  Internal Anal Sphincter 1/5  External Anal Sphincter 2/5  Puborectalis 1/5  Diastasis Recti   (Blank rows = not tested)        TONE: High in Lt levator ani   PROLAPSE: Not able to tell this session due to dyssynergic pelvic floor contraction     06/01/22: COGNITION: Overall cognitive status: Within functional limits for tasks assessed     SENSATION: Light touch: Appears intact Proprioception: Appears intact  GAIT: Comments: decreased hip extension, forward flexed trunk  POSTURE: rounded shoulders, forward head, decreased lumbar lordosis, increased thoracic kyphosis, posterior pelvic tilt, and flexed trunk    LUMBARAROM/PROM:  A/PROM A/PROM  Eval (% available)  Flexion 50  Extension 25, pain anteriorly   Right lateral flexion 50, pain anteriorly   Left lateral flexion 50, pain anteriorly    (Blank rows = not tested)   PALPATION:   General  Significant abdominal restriction and tenderness; decreased rib mobility with mobilization and breathing    TODAY'S TREATMENT:  DATE:  04/18/23 Manual: Prone PA mobilizations from T10-L5 and sacral base grade 2-4 Myofascial release to thoracolumbar fascia Abdominal mobilization/bowel massage Instrument assisted soft tissue mobilizatoin to bil lumbar paraspinals Exercises: Prone hip flexor stretches with manual overpressure  Supine hamstring stretches with manual overpressure Supine piriformis stretches with manual overpressure   04/11/23 Manual: Prone PA mobilizations from T10-L5 and sacral base grade 2-4 Myofascial  release to thoracolumbar fascia Abdominal mobilization/bowel massage Exercises: Prone hip flexor stretches with manual overpressure  Supine hamstring stretches with manual overpressure Supine piriformis stretches with manual overpressure   04/04/23 Abdominal soft tissue mobilization  Bowel mobilization Ileocecal valve myofascial release Quadruped myofascial release to lower thoracic/lumbar paraspinals  Prone lower thoracic Grade 2-3 mobilizations Prone soft tissue mobilization to lumbar paraspinals   PATIENT EDUCATION:  Education details: See above Person educated: Patient Education method: Programmer, multimedia, Demonstration, Actor cues, Verbal cues, and Handouts Education comprehension: verbalized understanding  HOME EXERCISE PROGRAM: [id number]  ASSESSMENT:  CLINICAL IMPRESSION: Pt had a more difficult week not feeling well. We are wondering if this is due to iron levels or possibly coming off Plaquinel. Due to being very sore after last week, we performed less PA mobilizations in prone; however, she demonstrated some good improvements in mobility. We continued LE stretching with hypersensitivity to stretch prior to restriction being found. She did well with trial of instrument assisted soft tissue mobilizatoin to lumbar paraspinals. She reported decreased soreness compared to the end of last session. She will continue to benefit from skilled PT intervention in order to decrease pain, improve bowel movements, and decrease urinary urgency/incontinence.     OBJECTIVE IMPAIRMENTS: decreased activity tolerance, decreased coordination, decreased endurance, decreased mobility, decreased strength, increased fascial restrictions, increased muscle spasms, impaired tone, postural dysfunction, and pain.   ACTIVITY LIMITATIONS: lifting, bending, continence, and locomotion level  PARTICIPATION LIMITATIONS: interpersonal relationship, community activity, and occupation  PERSONAL FACTORS: 1 comorbidity:  medical history  are also affecting patient's functional outcome.   REHAB POTENTIAL: Good  CLINICAL DECISION MAKING: Stable/uncomplicated  EVALUATION COMPLEXITY: Low   GOALS: Goals reviewed with patient? Yes  SHORT TERM GOALS: Target date: 07/06/22 - updated 07/12/22 - updated 08/18/22 - updated 09/22/22 - updated 10/26/22 - updated 11/09/22 - updated 12/15/22 updated 01/18/23  Pt will be independent with HEP.   Baseline: Goal status: MET 07/12/22  2.  Pt will be independent with diaphragmatic breathing and down training activities in order to improve pelvic floor relaxation.  Baseline:  Goal status: MET 07/12/22  3.  Pt will be independent with the knack, urge suppression technique, and double voiding in order to improve bladder habits and decrease urinary incontinence.   Baseline:  Goal status: MET 09/22/22  4.  Pt will be independent with use of squatty potty, relaxed toileting mechanics, and improved bowel movement techniques in order to increase ease of bowel movements and complete evacuation.   Baseline:  Goal status: MET 11/09/22  5.  Pt will be able to correctly perform diaphragmatic breathing and appropriate pressure management in order to prevent worsening vaginal wall laxity and improve pelvic floor A/ROM.   Baseline:  Goal status: MET 01/18/23  LONG TERM GOALS: Target date: 11/16/2022 - updated 07/12/22 - updated 08/18/22 - updated 09/22/22 - updated 10/26/22 - updated 11/09/22 - updated 12/15/22 - updated 02/21/23 - updated 03/23/23  Pt will be independent with advanced HEP.   Baseline:  Goal status: IN PROGRESS 03/23/23  2.  Pt will demonstrate normal pelvic floor muscle tone and A/ROM, able to  achieve 4/5 strength with contractions and 10 sec endurance, in order to provide appropriate lumbopelvic support in functional activities.   Baseline:  Goal status: IN PROGRESS 03/23/23  3.  Pt will increase all impaired lumbar A/ROM by 25% without pain.  Baseline:  Goal status:  IN  PROGRESS 03/23/23  4.  Pt will report pain no higher than 3/10 with any activity. Baseline:  Goal status:  IN PROGRESS 03/23/23  5.  Pt will report 0/10 pain with vaginal penetration in order to improve intimate relationship with partner.    Baseline:  Goal status:  IN PROGRESS 03/23/23  6.  Pt will be able to go 2-3 hours in between voids without urgency or incontinence in order to improve QOL and perform all functional activities with less difficulty.   Baseline:  Goal status: MET 02/21/23  7.  Pt will report no leaks with laughing, coughing, sneezing in order to improve comfort with interpersonal relationships and community activities.   Baseline:  Goal status:  IN PROGRESS 03/23/23  8.  Pt will have consistent bowel movements 4-5x/week without straining or pain.  Baseline:  Goal status:  MET 02/21/23  PLAN:  PT FREQUENCY: 1-2x/week  PT DURATION: 6 months  PLANNED INTERVENTIONS: Therapeutic exercises, Therapeutic activity, Neuromuscular re-education, Balance training, Gait training, Patient/Family education, Self Care, Joint mobilization, Dry Needling, Biofeedback, and Manual therapy  PLAN FOR NEXT SESSION: Continue to work on motor control activities surrounding pelvis; progress mobility; manual techniques as needed for comfort and to increase circulation.   Verlena Glenn, PT, DPT04/15/251:07 PM

## 2023-04-19 ENCOUNTER — Ambulatory Visit: Admitting: Occupational Therapy

## 2023-04-19 ENCOUNTER — Other Ambulatory Visit (HOSPITAL_COMMUNITY): Payer: Self-pay

## 2023-04-19 ENCOUNTER — Other Ambulatory Visit (INDEPENDENT_AMBULATORY_CARE_PROVIDER_SITE_OTHER): Payer: Self-pay

## 2023-04-19 ENCOUNTER — Encounter: Payer: Self-pay | Admitting: Occupational Therapy

## 2023-04-19 ENCOUNTER — Telehealth (INDEPENDENT_AMBULATORY_CARE_PROVIDER_SITE_OTHER): Payer: Self-pay | Admitting: Otolaryngology

## 2023-04-19 DIAGNOSIS — I890 Lymphedema, not elsewhere classified: Secondary | ICD-10-CM | POA: Diagnosis not present

## 2023-04-19 MED ORDER — FLUTICASONE PROPIONATE 50 MCG/ACT NA SUSP
2.0000 | Freq: Every day | NASAL | 5 refills | Status: DC
  Filled 2023-04-19 (×2): qty 16, 30d supply, fill #0
  Filled 2023-05-19: qty 16, 30d supply, fill #1
  Filled 2023-06-30: qty 16, 30d supply, fill #2
  Filled 2023-07-31: qty 16, 30d supply, fill #3
  Filled 2023-08-28: qty 16, 30d supply, fill #4
  Filled 2023-09-25: qty 16, 30d supply, fill #5

## 2023-04-19 NOTE — Therapy (Signed)
 OUTPATIENT OCCUPATIONAL THERAPY TREATMENT NOTE   L LOWER EXTREMITY/ B LOWER QUADRANT LYMPHEDEMA  Patient Name: Karen [name] MRN: [id number] DOB:1970-03-09, 53 y.o., female Today's Date: 04/19/2023  END OF SESSION:   OT End of Session - 04/19/23 0908     Visit Number 35    Number of Visits 36    Date for OT Re-Evaluation 05/17/23    OT Start Time 0903    OT Stop Time 1003    OT Time Calculation (min) 60 min    Activity Tolerance Patient tolerated treatment well;No increased pain    Behavior During Therapy [facility] for tasks assessed/performed               Past Medical History:  Diagnosis Date   Adenomyosis    Anemia    Arthritis 2013   L knee gets steroid injections    Asthmatic bronchitis 01/31/2017   Back pain    Chronic constipation    Diarrhea    DVT of lower extremity (deep venous thrombosis) (HCC)    Dysmenorrhea    Endometriosis    Esophageal stricture    G6PD deficiency    GERD (gastroesophageal reflux disease)    History of kidney stones    History of uterine fibroid    IBS (irritable bowel syndrome)    Interstitial cystitis    Lactose intolerance    Migraine    with aura   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   PONV (postoperative nausea and vomiting)    Recurrent upper respiratory infection (URI)    Sebaceous cyst of breast, right lower inner quadrant 10/25/2012   Excised 11/21/12    Sleep apnea    Syncope, non cardiac    Past Surgical History:  Procedure Laterality Date   ARTHROSCOPIC HAGLUNDS REPAIR     BREAST CYST EXCISION Right 11/21/2012   Procedure: CYST EXCISION BREAST;  Surgeon: Darcella Earnest, MD;  Location: [facility];  Service: General;  Laterality: Right;   BREAST CYST EXCISION Bilateral 01/18/2022   Procedure: EXCISION OF SEBACEOUS CYST BILATERAL BREAST;  Surgeon: Sim Dryer, MD;  Location: [facility];  Service: General;  Laterality: Bilateral;   BREAST EXCISIONAL BIOPSY Right 11/2012    CESAREAN SECTION  04/19/1993   CHOLECYSTECTOMY     COLONOSCOPY  05/02/2011   Procedure: COLONOSCOPY;  Surgeon: Venson Ginger., MD;  Location: [facility];  Service: Endoscopy;  Laterality: N/A;   colonoscopy  11/04/2014   DENTAL SURGERY  03/06/2018   2 surgeries ( 03/06/2018 and 04/06/2018)   to remove two separate benign tumors   ESOPHAGEAL MANOMETRY N/A 11/04/2015   Procedure: ESOPHAGEAL MANOMETRY (EM);  Surgeon: Jolinda Necessary, MD;  Location: [facility];  Service: Endoscopy;  Laterality: N/A;   ESOPHAGOGASTRODUODENOSCOPY  05/02/2011   Procedure: ESOPHAGOGASTRODUODENOSCOPY (EGD);  Surgeon: Venson Ginger., MD;  Location: [facility];  Service: Endoscopy;  Laterality: N/A;   ESOPHAGOGASTRODUODENOSCOPY N/A 09/01/2014   Procedure: ESOPHAGOGASTRODUODENOSCOPY (EGD);  Surgeon: Jolinda Necessary, MD;  Location: [facility];  Service: Endoscopy;  Laterality: N/A;   KNEE SURGERY     LAPAROSCOPY     x 4   LESION REMOVAL N/A 01/18/2022   Procedure: EXCISION OF SEBACEOUS CYSTS CHEST AND NECK;  Surgeon: Sim Dryer, MD;  Location: [facility];  Service: General;  Laterality: N/A;   PH IMPEDANCE STUDY N/A 11/04/2015   Procedure: PH IMPEDANCE STUDY;  Surgeon: Jolinda Necessary, MD;  Location: [facility];  Service: Endoscopy;  Laterality: N/A;   VAGINAL  HYSTERECTOMY  2010   TVH--ovaries remain   Patient Active Problem List   Diagnosis Date Noted   Sjogren syndrome with keratoconjunctivitis (HCC) 12/09/2022   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   Elevated LFTs 04/04/2021   Colitis 04/04/2021   Bacterial overgrowth syndrome 05/21/2020   Obesity 05/21/2020   History of endometriosis 05/21/2020   Constipation due to outlet dysfunction 02/05/2020   Gastroesophageal reflux disease 02/05/2020   Obstructive sleep apnea treated with continuous positive airway pressure (CPAP) 06/16/2017   Perennial allergic rhinitis with a nonallergic component 01/31/2017   Asthmatic bronchitis  01/31/2017   GI symptoms 01/31/2017   Family history of colon cancer 01/27/2015   Chest pain 12/13/2012   Dyspnea 12/13/2012   DVT of lower extremity (deep venous thrombosis) (HCC) 01/03/1990    PCP: Coral Else, MD  REFERRING PROVIDER: Lorenda Ishihara, MD  REFERRING DIAG: I89.0  THERAPY DIAG:  Lymphedema, not elsewhere classified  Rationale for Evaluation and Treatment: Rehabilitation  ONSET DATE: ~2014; exacerbation in 2023  SUBJECTIVE:                                                                                                                                                                                          SUBJECTIVE STATEMENT:  Pt presents for OT to address lower quadrant and LLE lymphedema. Pt describes leg pain as "achy" and rates as 2/10. Pt desribes abdominal discomfort as "fullness" at 4/10. Pt states she has BM most days, but continues to have discomfort daily.  "Ive been really tired too."   PERTINENT HISTORY: CVI, OSA (no CPAP), GERD, Esophageal stricture, IBS, Lactose intolerant, Diarrhea, Hx LE DVT  PAIN:  Are you having pain? Yes: Pls see SUBJECTIVE Pain location: LLE, digestive discomfort Pain description: myalgia, tight, heavy, tingling, numbness Aggravating factors: standing, walking, dependent sitting > 30 minutes Relieving factors: elevation  PRECAUTIONS: Other: LYMPHEDEMA; Hx LLE DVT  RED FLAGS: Hx LLE DVT  WEIGHT BEARING RESTRICTIONS: No  FALLS:  Has patient fallen in last 6 months? No  LIVING ENVIRONMENT: Lives with: lives with spouse and daughter Lives in: House/apartment Stairs: Yes; Internal: 14 steps; yes and External: 4 steps; yes Has following equipment at home: None  OCCUPATION: [employer] operational officer, walking, desk work  LEISURE: family time  HAND DOMINANCE: right   PRIOR LEVEL OF FUNCTION: Independent  PATIENT GOALS: Feel better, be able to be more active without pain, wear preferred street  shoes  OBJECTIVE: Note: Objective measures were completed at Evaluation unless otherwise noted.  COGNITION:  Overall cognitive status: Within functional limits for tasks assessed   POSTURE: WFL  LE ROM: WFL  LE MMT: [facility]  LYMPHEDEMA  ASSESSMENTS: non-Ca related  INFECTIONS: denies cellulitis and wound hx  BLE COMPARATIVE LIMB VOLUMETRICS Initial 11/15/22  LANDMARK RIGHT (dominant)  R LEG (A-D) 3028.9 ml  R THIGH (E-G) 4809.60 ml  R FULL LIMB (A-G) 7838.5 ml  Limb Volume differential (LVD)  %  Volume change since initial %  Volume change overall V  (Blank rows = not tested)  LANDMARK LEFT  (Rx)  L LEG (A-D) 3189.9 ml  L THIGH (E-G) 4959.8 ml  L FULL LIMB (A-G) 8149.7 ml  Limb Volume differential (LVD)  LEG LVD = 5.32%, L>R; THIGH LVD = 3.12%, L>R; And full LLE LVD = 3.97%, L>R.   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  Skin  Description Hyper-Keratosis Peau d' Orange Shiny Tight Fibrotic/ Indurated Fatty Doughy Spongy/ boggy       R>L x  x   Skin dry Flaky WNL Macerated   mildly      Color Redness Varicosities Blanching Hemosiderin Stain Mottled        x   Odor Malodorous Yeast Fungal infection  WNL      x   Temperature Warm Cool wnl     x    Pitting Edema   1+ 2+ 3+ 4+ Non-pitting         x   Girth Symmetrical Asymmetrical                   Distribution    L>R toes to groin    Stemmer Sign Positive Negative   +L -R   Lymphorrhea History Of:  Present Absent     x    Wounds History Of Present Absent Venous Arterial Pressure Sheer     x         Signs of Infection Redness Warmth Erythema Acute Swelling Drainage Borders                    Sensation Light Touch Deep pressure Hypersensitivity   In tact Impaired In tact Impaired Absent Impaired   x  x  x     Nails WNL   Fungus nail dystrophy   x     Hair Growth Symmetrical Asymmetrical   x    Skin Creases Base of toes  Ankles   Base of Fingers knees       Abdominal pannus Thigh Lobules  Face/neck   x           GAIT: Distance walked: >500' Assistive device utilized: None Level of assistance: Complete Independence Comments: Functional ambulation and transfers [facility]  LYMPHEDEMA LIFE IMPACT SCALE (LLIS): Initial 11/15/22  50%  FOTO functional outcome measure: Deferred. FOTO discontinued.  TODAY'S TREATMENT:  MLD to BLQ emphasis on colon and LLE Pt edu for le SELF CARE  PATIENT EDUCATION:  Continued Pt/ CG edu for how function of cisterna chyli, part of the thoracic duct of deep abdominal lymphatics, assists with digestion by filtering many of the fats and proteins from the digestive system. Sub optimal lymphatic flow in this region may result in constipation, bloating, swelling, etc. MLD helps mobilize these protein and fats , and the rhythmic movement of manual lymphatic drainage stimulates digestive muscles and increase peristalsis. Provided printed resource for reference.  Topics include outcome of comparative limb volumetrics- starting limb volume differentials (LVDs), technology and gradient techniques used for short stretch, multilayer compression wrapping, simple self-MLD, therapeutic lymphatic pumping exercises, skin/nail care, LE precautions,. compression garment recommendations and specifications, wear and care schedule and compression garment donning / doffing w assistive devices. Discussed progress towards all OT goals since  commencing CDT. All questions answered to the Pt's satisfaction. Good return. Person educated: Patient  Education method: Explanation, Demonstration, and Handouts Education comprehension: verbalized understanding, returned demonstration, verbal cues required, and needs further education  HOME EXERCISE PROGRAM: BLE lymphatic pumping there ex using- 1 sets of 10 reps, each exercise in order-  1-2 x daily, bilaterally Simple self MLD 1 x daily Daily skin care to increase hydration, skin mobility and decrease infection risk- can be done during MLD Compression wraps 23/7 during intensive phase of CDT Compression garments/ devices during self-management phase of CDT  ASSESSMENT:  CLINICAL IMPRESSION:    Pt continues to tolerate and respond well to CDT for deep abdominal lymphatics and for LLE, although the focus of Rx has shifted more towards reducing gut symptoms and LLE is relatively stable. Pt notes that she has not needed a colon cleanse for 1.5 months and w 1-2 x weekly MLD she no longer needs medication for constipation.  Today Pt tolerated abdominal MLD without increased pain.  Performed J strokes to descending, transverse and ascending colon region , then utilized deep abdominal lymphatic pathways, including alternate pressure and release on descending, then repeated transverse, then ascending colon and diaphragmatic breathing. Provided MLD to LLE as established with good tolerance. Pt  continues to benefit from skilled OT for Lymphedema care to reduce pain and digestive discomfort. Cont  1-2 x weekly as per POC.  11/15/22 Initial Evaluation: Karen [name] is a 53 y.o. female presenting with very mild, stage II, LLE lymphedema 2/2 venous insufficiency and obesity. L Leg swelling and associated pain swelling fluctuates. It has progressed over time, and now no longer resolves with elevation over night. RLE swelling limits balance during functional ambulation. It exacerbates infection and falls  risk. LLE/RLQ lymphedema interferes with functional performance in all occupational domains, including basic and instrumental ADLs, productive activities, leisure pursuits and social participation. Pt will benefit from Occupational Therapy for Complete Decongestive Therapy (CDT) to restore function, to reduce physical and psychologic suffering associated with chronic, progressive lymphedema and associated pain, and to limit infection. CDT will include manual lymphatic drainage (MLD), skin care, therapeutic exercise and compression wraps and garment's devices. Without skilled OT for lymphedema care the condition will worsen and further functional decline is expected  Custom-made gradient compression garments and HOS devices are medically necessary because they are uniquely sized and shaped to fit the exact dimensions of the affected extremities, and to provide appropriate medical grade, graduated compression essential for optimally managing chronic, progressive lymphedema. Multiple custom compression garments are needed to ensure proper hygiene to limit infection risk. Custom compression garments  should be replaced q 3-6 months When worn consistently for optimal lipo-lymphedema self-management over time. HOS devices, medically necessary to limit fibrosis buildup in tissue, should be replaced q 2 years and PRN when worn out.      OBJECTIVE IMPAIRMENTS: decreased activity tolerance, decreased knowledge of condition, decreased knowledge of use of DME, increased edema, impaired sensation, pain, and chronic, progressive leg swelling .   ACTIVITY LIMITATIONS: dependent sitting, extended standing and/ or walking, squatting, and lower body dressing, fitting preferred street shoes  PARTICIPATION LIMITATIONS: shopping, community activity, occupation, and yard work  PERSONAL FACTORS: 3+ comorbidities: Hx DVT,  OSA, CVI. Varicose veins  are also affecting patient's functional outcome.   REHAB POTENTIAL:  Good  EVALUATION COMPLEXITY: Moderate   GOALS: Goals reviewed with patient? Yes  SHORT TERM GOALS: Target date: 4th OT Rx visit  SHORT TERM GOALS: Target date: 4th OT Rx visit   Pt will demonstrate understanding of lymphedema precautions and prevention strategies with modified independence using a printed reference to identify at least 5 precautions and discussing how s/he may implement them into daily life to reduce risk of progression with extra time. Baseline:Max A Goal status: GOAL MET   Pt will be able to apply multilayer, knee length, gradient, compression wraps to one leg at a time with modified assistance (extra time and assistive device/s) to decrease limb volume, to limit infection risk, and to limit lymphedema progression.  Baseline: Dependent Goal status: GOAL DISCHARGED. Pt Going to compression garments instead  LONG TERM GOALS: Target date: 02/08/23  Given this patient's Intake score of 67%/100% on the functional outcomes FOTO tool, patient will experience an increase in function of 5 points to improve basic and instrumental ADLs performance, including lymphedema self-care.  Baseline: Max A Goal status:GOAL DISCHARGED.  FOTO TOOL DISCONTINUED AT CLINIC   Given this patient's Intake score of 50% on the Lymphedema Life Impact Scale (LLIS), patient will experience a reduction of at least 5 points in her perceived level of functional impairment resulting from lymphedema to improve functional performance and quality of life (QOL). Baseline: 50% Goal status: PROGRESSING  Pt will achieve at least a 10% volume reduction in B legs to return limb to typical size and shape, to limit infection risk and LE progression, to decrease pain, to improve function. Baseline: Dependent Goal status: PROGRESSING  4.  Pt will obtain proper compression garments/devices and achieve modified independence (extra time + assistive devices) with donning/doffing to optimize limb volume reductions and  limit LE  progression over time. Baseline:  Goal status: ONGOING.  5.  During Intensive phase CDT , with modified independence, Pt will achieve at least 85% compliance with all lymphedema self-care home program components, including daily skin care, compression wraps and /or garments, simple self MLD and lymphatic pumping therex to habituate LE self care protocol  into ADLs for optimal LE self-management over time. Baseline: Dependent Goal status: PROGRESSING   PLAN:  OT FREQUENCY: 2x/week  OT DURATION: 12 weeks  PLANNED INTERVENTIONS: 97110-Therapeutic exercises, 97530- Therapeutic activity, 97535- Self Care, 40981- Manual therapy, Patient/Family education, Manual lymph drainage, Compression bandaging, and fit with appropriate compression garments  PLAN FOR NEXT SESSION:  Cont MLD Cont Pt edu for LE self-care  Loel Dubonnet, MS, OTR/L, CLT-LANA 04/19/23 12:26 PM

## 2023-04-19 NOTE — Telephone Encounter (Signed)
 Flonase nasal spray was called in with 5 refills.

## 2023-04-20 ENCOUNTER — Other Ambulatory Visit (HOSPITAL_COMMUNITY): Payer: Self-pay

## 2023-04-20 DIAGNOSIS — Z79899 Other long term (current) drug therapy: Secondary | ICD-10-CM | POA: Diagnosis not present

## 2023-04-25 ENCOUNTER — Encounter: Payer: Self-pay | Admitting: Occupational Therapy

## 2023-04-25 ENCOUNTER — Ambulatory Visit: Admitting: Occupational Therapy

## 2023-04-25 ENCOUNTER — Ambulatory Visit: Payer: Commercial Managed Care - PPO

## 2023-04-25 DIAGNOSIS — R279 Unspecified lack of coordination: Secondary | ICD-10-CM | POA: Diagnosis not present

## 2023-04-25 DIAGNOSIS — R102 Pelvic and perineal pain: Secondary | ICD-10-CM

## 2023-04-25 DIAGNOSIS — I890 Lymphedema, not elsewhere classified: Secondary | ICD-10-CM

## 2023-04-25 DIAGNOSIS — M6281 Muscle weakness (generalized): Secondary | ICD-10-CM

## 2023-04-25 DIAGNOSIS — M5459 Other low back pain: Secondary | ICD-10-CM

## 2023-04-25 DIAGNOSIS — M62838 Other muscle spasm: Secondary | ICD-10-CM

## 2023-04-25 NOTE — Therapy (Signed)
 OUTPATIENT PHYSICAL THERAPY FEMALE PELVIC TREATMENT   Patient Name: Karen [name] MRN: [id number] DOB:June 04, 1970, 53 y.o., female Today's Date: 04/25/2023  END OF SESSION:  PT End of Session - 04/25/23 1158     Visit Number 38    Date for PT Re-Evaluation 10/10/23    Authorization Type [employer]    PT Start Time 1156    PT Stop Time 1228    PT Time Calculation (min) 32 min    Activity Tolerance Patient tolerated treatment well    Behavior During Therapy [facility] for tasks assessed/performed                      Past Medical History:  Diagnosis Date   Adenomyosis    Anemia    Arthritis 2013   L knee gets steroid injections    Asthmatic bronchitis 01/31/2017   Back pain    Chronic constipation    Diarrhea    DVT of lower extremity (deep venous thrombosis) (HCC)    Dysmenorrhea    Endometriosis    Esophageal stricture    G6PD deficiency    GERD (gastroesophageal reflux disease)    History of kidney stones    History of uterine fibroid    IBS (irritable bowel syndrome)    Interstitial cystitis    Lactose intolerance    Migraine    with aura   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   PONV (postoperative nausea and vomiting)    Recurrent upper respiratory infection (URI)    Sebaceous cyst of breast, right lower inner quadrant 10/25/2012   Excised 11/21/12    Sleep apnea    Syncope, non cardiac    Past Surgical History:  Procedure Laterality Date   ARTHROSCOPIC HAGLUNDS REPAIR     BREAST CYST EXCISION Right 11/21/2012   Procedure: CYST EXCISION BREAST;  Surgeon: Darcella Earnest, MD;  Location: [facility];  Service: General;  Laterality: Right;   BREAST CYST EXCISION Bilateral 01/18/2022   Procedure: EXCISION OF SEBACEOUS CYST BILATERAL BREAST;  Surgeon: Sim Dryer, MD;  Location: [facility];  Service: General;  Laterality: Bilateral;   BREAST EXCISIONAL BIOPSY Right 11/2012   CESAREAN SECTION  04/19/1993    CHOLECYSTECTOMY     COLONOSCOPY  05/02/2011   Procedure: COLONOSCOPY;  Surgeon: Venson Ginger., MD;  Location: [facility];  Service: Endoscopy;  Laterality: N/A;   colonoscopy  11/04/2014   DENTAL SURGERY  03/06/2018   2 surgeries ( 03/06/2018 and 04/06/2018)   to remove two separate benign tumors   ESOPHAGEAL MANOMETRY N/A 11/04/2015   Procedure: ESOPHAGEAL MANOMETRY (EM);  Surgeon: Jolinda Necessary, MD;  Location: [facility];  Service: Endoscopy;  Laterality: N/A;   ESOPHAGOGASTRODUODENOSCOPY  05/02/2011   Procedure: ESOPHAGOGASTRODUODENOSCOPY (EGD);  Surgeon: Venson Ginger., MD;  Location: [facility];  Service: Endoscopy;  Laterality: N/A;   ESOPHAGOGASTRODUODENOSCOPY N/A 09/01/2014   Procedure: ESOPHAGOGASTRODUODENOSCOPY (EGD);  Surgeon: Jolinda Necessary, MD;  Location: [facility];  Service: Endoscopy;  Laterality: N/A;   KNEE SURGERY     LAPAROSCOPY     x 4   LESION REMOVAL N/A 01/18/2022   Procedure: EXCISION OF SEBACEOUS CYSTS CHEST AND NECK;  Surgeon: Sim Dryer, MD;  Location: [facility];  Service: General;  Laterality: N/A;   PH IMPEDANCE STUDY N/A 11/04/2015   Procedure: PH IMPEDANCE STUDY;  Surgeon: Jolinda Necessary, MD;  Location: [facility];  Service: Endoscopy;  Laterality: N/A;   VAGINAL HYSTERECTOMY  2010   TVH--ovaries remain   Patient Active Problem List   Diagnosis Date Noted   Sjogren syndrome with keratoconjunctivitis (HCC) 12/09/2022   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   Elevated LFTs 04/04/2021   Colitis 04/04/2021   Bacterial overgrowth syndrome 05/21/2020   Obesity 05/21/2020   History of endometriosis 05/21/2020   Constipation due to outlet dysfunction 02/05/2020   Gastroesophageal reflux disease 02/05/2020   Obstructive sleep apnea treated with continuous positive airway pressure (CPAP) 06/16/2017   Perennial allergic rhinitis with a nonallergic component 01/31/2017   Asthmatic bronchitis 01/31/2017   GI symptoms 01/31/2017    Family history of colon cancer 01/27/2015   Chest pain 12/13/2012   Dyspnea 12/13/2012   DVT of lower extremity (deep venous thrombosis) (HCC) 01/03/1990    PCP: Arva Lathe, MD  REFERRING PROVIDER: Lillian Rein, MD   REFERRING DIAG: M62.89 (ICD-10-CM) - Pelvic floor dysfunction  THERAPY DIAG:  Pelvic pain  Other low back pain  Muscle weakness (generalized)  Other muscle spasm  Unspecified lack of coordination  Rationale for Evaluation and Treatment: Rehabilitation  ONSET DATE: chronic  SUBJECTIVE:                                                                                                                                                                                           SUBJECTIVE STATEMENT:  Colonoscopy/endoscopy schedule for 05/25/23.  Pt states that she has had some notable low back pain over the last week. She continues to have diarrhea, but bowel movements can be from a 1-7 on Bristol Stool Scale all in the same movement. No real changes overall, but she feels like treatment keeps her from taking as much medication.    PAIN: 04/25/23 Are you having pain? Yes NPRS scale: 3/10 Pain location:  Lower abdominal pain, Lt sided pain, low back pain  Pain type: turning, knotted, pressure Pain description: intermittent and constant   Aggravating factors: constipation or frequent bowel movements/constantly having bowel movements  Relieving factors: exercises (bowel massage, happy baby, spinal twists, walking)  PRECAUTIONS: None  WEIGHT BEARING RESTRICTIONS: No  FALLS:  Has patient fallen in last 6 months? No  LIVING ENVIRONMENT: Lives with: lives with their spouse Lives in: House/apartment  OCCUPATION: nurse, in admin  PLOF: Independent  PATIENT GOALS: decrease pain and have more normal bowel movements  PERTINENT HISTORY:  Vaginal hysterectomy, DVT LE, endometriosis, interstitial cystitis, exploratory laps for endometriosis, c-section,  cholecystectomy Sexual abuse: No  BOWEL MOVEMENT: Pain with bowel movement: Yes Type of bowel movement:Type (Bristol Stool Scale) 1-7 (sometimes full range in the same bowel movement), Frequency sometimes many times a day, sometimes up  to 4 days in between, and Strain Yes Fully empty rectum: No Leakage: No Pads: No Fiber supplement: No - has attempted metamucil and it made constipation worse  URINATION: Pain with urination: Yes Fully empty bladder: No Stream:  varies  Urgency: Yes: all the time Frequency: multiple times an hours  Leakage: Urge to void, Walking to the bathroom, Coughing, Sneezing, Laughing, and Exercise Pads: Yes: daily  INTERCOURSE: Pain with intercourse: Initial Penetration and During Penetration Ability to have vaginal penetration:  Yes: with pain Climax: non painful WNL   PREGNANCY: Vaginal deliveries 1 Tearing Yes: 3rd degree tear C-section deliveries 1 Currently pregnant No  PROLAPSE: Periodically will feel vaginal bulging, heaviness in lower abdomen   OBJECTIVE:  04/25/23: Curl-up test: abdominal distortion Transversus abdominus: week, difficulty getting active contraction (not been working on strengthening)    LUMBARAROM/PROM:  A/PROM A/PROM  Eval (% available) 11/09/22 (% available) 04/25/23 (% available)  Flexion 50 90 90  Extension 25, pain anteriorly  25, pain in low back 50  Right lateral flexion 50, pain anteriorly  75, pain on right 50, pain on Rt  Left lateral flexion 50, pain anteriorly  75, pain on right 75   (Blank rows = not tested)  11/09/22: No internal rectal or vaginal pelvic floor exam performed due to high level of skin irritation   Weak transversus abdominus contraction  Abdominal restriction and tenderness in bil lower quadrant  Unable to perform pelvic tilt with appropriate coordination  LUMBARAROM/PROM:  A/PROM A/PROM  Eval (% available) 11/09/22 (% available)  Flexion 50 90  Extension 25, pain anteriorly  25,  pain in low back  Right lateral flexion 50, pain anteriorly  75, pain on right  Left lateral flexion 50, pain anteriorly  75, pain on right   (Blank rows = not tested) 07/21/22: standing prolapse assessment demonstrated grade 2 anterior vaginal wall laxity   06/08/22:               External Perineal Exam: WNL                              Internal Pelvic Floor: burning reported with palpation of superficial muscles; deep aching/pressure with palpation of Lt levator ani   Patient confirms identification and approves PT to assess internal pelvic floor and treatment Yes  PELVIC MMT:   MMT eval  Vaginal 3/5  Internal Anal Sphincter 1/5  External Anal Sphincter 2/5  Puborectalis 1/5  Diastasis Recti   (Blank rows = not tested)        TONE: High in Lt levator ani   PROLAPSE: Not able to tell this session due to dyssynergic pelvic floor contraction     06/01/22: COGNITION: Overall cognitive status: Within functional limits for tasks assessed     SENSATION: Light touch: Appears intact Proprioception: Appears intact  GAIT: Comments: decreased hip extension, forward flexed trunk  POSTURE: rounded shoulders, forward head, decreased lumbar lordosis, increased thoracic kyphosis, posterior pelvic tilt, and flexed trunk    LUMBARAROM/PROM:  A/PROM A/PROM  Eval (% available)  Flexion 50  Extension 25, pain anteriorly   Right lateral flexion 50, pain anteriorly   Left lateral flexion 50, pain anteriorly    (Blank rows = not tested)   PALPATION:   General  Significant abdominal restriction and tenderness; decreased rib mobility with mobilization and breathing    TODAY'S TREATMENT:  DATE:  04/25/23 Manual: ILU massage in supine Bowel mobilization in supine Neuromuscular re-education: Seated hip adduction ball press with transversus abdominus and pelvic  floor muscle 2 x 10 Seated hip abduction red band with transversus abdominus and pelvic floor muscle 2 x 10 Seated resisted march red band with transversus abdominus and pelvic floor muscle 2 x 10  Therapeutic activities: Reviewed importance of strengthening program as part of HEP Discussed performing internal pelvic floor muscle exam to work on paradoxical pelvic floor muscle contraction now that vaginal/perineal irritation has improved to help with constipation   04/18/23 Manual: Prone PA mobilizations from T10-L5 and sacral base grade 2-4 Myofascial release to thoracolumbar fascia Abdominal mobilization/bowel massage Instrument assisted soft tissue mobilizatoin to bil lumbar paraspinals Exercises: Prone hip flexor stretches with manual overpressure  Supine hamstring stretches with manual overpressure Supine piriformis stretches with manual overpressure   04/11/23 Manual: Prone PA mobilizations from T10-L5 and sacral base grade 2-4 Myofascial release to thoracolumbar fascia Abdominal mobilization/bowel massage Exercises: Prone hip flexor stretches with manual overpressure  Supine hamstring stretches with manual overpressure Supine piriformis stretches with manual overpressure    PATIENT EDUCATION:  Education details: See above Person educated: Patient Education method: Programmer, multimedia, Demonstration, Actor cues, Verbal cues, and Handouts Education comprehension: verbalized understanding  HOME EXERCISE PROGRAM: [id number]  ASSESSMENT:  CLINICAL IMPRESSION: Pt continues to see little improvement with constipation and more complete bowel movements. Her low back pain appears to be exacerbated by constipation, but believe she also has some orthopedic issues in low back that are a separate source of pain since it can be reproduced by movement. Due to this, we discussed the importance of working on strengthening in addition to the stretches. If she is having back pain, it is likely also  decreasing normal bowel motility. We also discussed that she has been stuck in constipation/diarrhea cycle which may have left her rectum and colon larger than normal, decreasing sensitivity to stretch. She may need to stay on cleanse program for longer period of time to allow this to improve. There is also likely still a pelvic floor muscle contribution to inability to completely empty due to previous paradoxical pelvic floor muscle contraction, but per patient request we did not examine this today; we will plan to next visit and work on coordination accordingly. She tolerated new type of bowel mobilization with ILU massage today and was encouraged to be aware of any changes in bowel movements. Good tolerance to some pelvic stability/hip strengthening activities. She will continue to benefit from skilled PT intervention in order to decrease pain, improve bowel movements, and decrease urinary urgency/incontinence.     OBJECTIVE IMPAIRMENTS: decreased activity tolerance, decreased coordination, decreased endurance, decreased mobility, decreased strength, increased fascial restrictions, increased muscle spasms, impaired tone, postural dysfunction, and pain.   ACTIVITY LIMITATIONS: lifting, bending, continence, and locomotion level  PARTICIPATION LIMITATIONS: interpersonal relationship, community activity, and occupation  PERSONAL FACTORS: 1 comorbidity: medical history  are also affecting patient's functional outcome.   REHAB POTENTIAL: Good  CLINICAL DECISION MAKING: Stable/uncomplicated  EVALUATION COMPLEXITY: Low   GOALS: Goals reviewed with patient? Yes  SHORT TERM GOALS: Updated 04/25/23  Pt will be independent with HEP.   Baseline: Goal status: MET 07/12/22  2.  Pt will be independent with diaphragmatic breathing and down training activities in order to improve pelvic floor relaxation.  Baseline:  Goal status: MET 07/12/22  3.  Pt will be independent with the knack, urge suppression  technique, and double voiding in order to  improve bladder habits and decrease urinary incontinence.   Baseline:  Goal status: MET 09/22/22  4.  Pt will be independent with use of squatty potty, relaxed toileting mechanics, and improved bowel movement techniques in order to increase ease of bowel movements and complete evacuation.   Baseline:  Goal status: MET 11/09/22  5.  Pt will be able to correctly perform diaphragmatic breathing and appropriate pressure management in order to prevent worsening vaginal wall laxity and improve pelvic floor A/ROM.   Baseline:  Goal status: MET 01/18/23  LONG TERM GOALS: Updated 04/25/23  Pt will be independent with advanced HEP.   Baseline:  Goal status: IN PROGRESS 04/25/23  2.  Pt will demonstrate normal pelvic floor muscle tone and A/ROM, able to achieve 4/5 strength with contractions and 10 sec endurance, in order to provide appropriate lumbopelvic support in functional activities.   Baseline: not assessed today per patient request  Goal status: IN PROGRESS 04/25/23  3.  Pt will increase all impaired lumbar A/ROM by 25% without pain.  Baseline: Pt seen a little bit of loss in some and improvement in extension Goal status:  IN PROGRESS 04/25/23  4.  Pt will report pain no higher than 3/10 with any activity. Baseline: pain has gotten up to 5/10, but currently at a 3/10 Goal status:  IN PROGRESS 04/25/23  5.  Pt will report 0/10 pain with vaginal penetration in order to improve intimate relationship with partner.    Baseline: no recent intercourse attempts  Goal status:  IN PROGRESS 04/25/23  6.  Pt will be able to go 2-3 hours in between voids without urgency or incontinence in order to improve QOL and perform all functional activities with less difficulty.   Baseline:  Goal status: MET 02/21/23  7.  Pt will report no leaks with laughing, coughing, sneezing in order to improve comfort with interpersonal relationships and community activities.    Baseline: Pt states that she feels 30-40% better Goal status:  IN PROGRESS 04/25/23  8.  Pt will have consistent bowel movements 4-5x/week without straining or pain.  Baseline:  Goal status:  MET 02/21/23  PLAN:  PT FREQUENCY: 1-2x/week  PT DURATION: 6 months  PLANNED INTERVENTIONS: Therapeutic exercises, Therapeutic activity, Neuromuscular re-education, Balance training, Gait training, Patient/Family education, Self Care, Joint mobilization, Dry Needling, Biofeedback, and Manual therapy  PLAN FOR NEXT SESSION: strengthening program - reinforce importance and go over some exercises each session; internal pelvic floor muscle exam  Verlena Glenn, PT, DPT04/22/2512:50 PM

## 2023-04-26 NOTE — Therapy (Signed)
 OUTPATIENT OCCUPATIONAL THERAPY TREATMENT NOTE   L LOWER EXTREMITY/ B LOWER QUADRANT LYMPHEDEMA  Patient Name: CARINE NORDGREN MRN: [id number] DOB:13-Jul-1970, 53 y.o., female Today's Date: 04/26/2023  END OF SESSION:   OT End of Session - 04/26/23 1133     Visit Number 36    Number of Visits 36    Date for OT Re-Evaluation 05/17/23    OT Start Time 0805    OT Stop Time 0903    OT Time Calculation (min) 58 min    Activity Tolerance Patient tolerated treatment well;No increased pain    Behavior During Therapy [facility] for tasks assessed/performed               Past Medical History:  Diagnosis Date   Adenomyosis    Anemia    Arthritis 2013   L knee gets steroid injections    Asthmatic bronchitis 01/31/2017   Back pain    Chronic constipation    Diarrhea    DVT of lower extremity (deep venous thrombosis) (HCC)    Dysmenorrhea    Endometriosis    Esophageal stricture    G6PD deficiency    GERD (gastroesophageal reflux disease)    History of kidney stones    History of uterine fibroid    IBS (irritable bowel syndrome)    Interstitial cystitis    Lactose intolerance    Migraine    with aura   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   PONV (postoperative nausea and vomiting)    Recurrent upper respiratory infection (URI)    Sebaceous cyst of breast, right lower inner quadrant 10/25/2012   Excised 11/21/12    Sleep apnea    Syncope, non cardiac    Past Surgical History:  Procedure Laterality Date   ARTHROSCOPIC HAGLUNDS REPAIR     BREAST CYST EXCISION Right 11/21/2012   Procedure: CYST EXCISION BREAST;  Surgeon: Darcella Earnest, MD;  Location: [facility];  Service: General;  Laterality: Right;   BREAST CYST EXCISION Bilateral 01/18/2022   Procedure: EXCISION OF SEBACEOUS CYST BILATERAL BREAST;  Surgeon: Sim Dryer, MD;  Location: [facility];  Service: General;  Laterality: Bilateral;   BREAST EXCISIONAL BIOPSY Right 11/2012    CESAREAN SECTION  04/19/1993   CHOLECYSTECTOMY     COLONOSCOPY  05/02/2011   Procedure: COLONOSCOPY;  Surgeon: Venson Ginger., MD;  Location: [facility];  Service: Endoscopy;  Laterality: N/A;   colonoscopy  11/04/2014   DENTAL SURGERY  03/06/2018   2 surgeries ( 03/06/2018 and 04/06/2018)   to remove two separate benign tumors   ESOPHAGEAL MANOMETRY N/A 11/04/2015   Procedure: ESOPHAGEAL MANOMETRY (EM);  Surgeon: Jolinda Necessary, MD;  Location: [facility];  Service: Endoscopy;  Laterality: N/A;   ESOPHAGOGASTRODUODENOSCOPY  05/02/2011   Procedure: ESOPHAGOGASTRODUODENOSCOPY (EGD);  Surgeon: Venson Ginger., MD;  Location: [facility];  Service: Endoscopy;  Laterality: N/A;   ESOPHAGOGASTRODUODENOSCOPY N/A 09/01/2014   Procedure: ESOPHAGOGASTRODUODENOSCOPY (EGD);  Surgeon: Jolinda Necessary, MD;  Location: [facility];  Service: Endoscopy;  Laterality: N/A;   KNEE SURGERY     LAPAROSCOPY     x 4   LESION REMOVAL N/A 01/18/2022   Procedure: EXCISION OF SEBACEOUS CYSTS CHEST AND NECK;  Surgeon: Sim Dryer, MD;  Location: [facility];  Service: General;  Laterality: N/A;   PH IMPEDANCE STUDY N/A 11/04/2015   Procedure: PH IMPEDANCE STUDY;  Surgeon: Jolinda Necessary, MD;  Location: [facility];  Service: Endoscopy;  Laterality: N/A;   VAGINAL  HYSTERECTOMY  2010   TVH--ovaries remain   Patient Active Problem List   Diagnosis Date Noted   Sjogren syndrome with keratoconjunctivitis (HCC) 12/09/2022   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   Elevated LFTs 04/04/2021   Colitis 04/04/2021   Bacterial overgrowth syndrome 05/21/2020   Obesity 05/21/2020   History of endometriosis 05/21/2020   Constipation due to outlet dysfunction 02/05/2020   Gastroesophageal reflux disease 02/05/2020   Obstructive sleep apnea treated with continuous positive airway pressure (CPAP) 06/16/2017   Perennial allergic rhinitis with a nonallergic component 01/31/2017   Asthmatic bronchitis  01/31/2017   GI symptoms 01/31/2017   Family history of colon cancer 01/27/2015   Chest pain 12/13/2012   Dyspnea 12/13/2012   DVT of lower extremity (deep venous thrombosis) (HCC) 01/03/1990    PCP: Genny Kid, MD  REFERRING PROVIDER: Arva Lathe, MD  REFERRING DIAG: I89.0  THERAPY DIAG:  Lymphedema, not elsewhere classified  Rationale for Evaluation and Treatment: Rehabilitation  ONSET DATE: ~2014; exacerbation in 2023  SUBJECTIVE:                                                                                                                                                                                          SUBJECTIVE STATEMENT:  Pt presents for OT to address lower quadrant and LLE lymphedema. Pt does not rate LE-related pain this morning. Pt has no new complaints or concerns.   PERTINENT HISTORY: CVI, OSA (no CPAP), GERD, Esophageal stricture, IBS, Lactose intolerant, Diarrhea, Hx LE DVT  PAIN:  Are you having pain? Yes: Pls see SUBJECTIVE Pain location: LLE, digestive discomfort Pain description: myalgia, tight, heavy, tingling, numbness Aggravating factors: standing, walking, dependent sitting > 30 minutes Relieving factors: elevation  PRECAUTIONS: Other: LYMPHEDEMA; Hx LLE DVT  RED FLAGS: Hx LLE DVT  WEIGHT BEARING RESTRICTIONS: No  FALLS:  Has patient fallen in last 6 months? No  LIVING ENVIRONMENT: Lives with: lives with spouse and daughter Lives in: House/apartment Stairs: Yes; Internal: 14 steps; yes and External: 4 steps; yes Has following equipment at home: None  OCCUPATION: [employer] operational officer, walking, desk work  LEISURE: family time  HAND DOMINANCE: right   PRIOR LEVEL OF FUNCTION: Independent  PATIENT GOALS: Feel better, be able to be more active without pain, wear preferred street shoes  OBJECTIVE: Note: Objective measures were completed at Evaluation unless otherwise noted.  COGNITION:  Overall cognitive status:  Within functional limits for tasks assessed   POSTURE: WFL  LE ROM: WFL  LE MMT: WFL  LYMPHEDEMA ASSESSMENTS: non-Ca related  INFECTIONS: denies cellulitis and wound hx  BLE COMPARATIVE LIMB VOLUMETRICS Initial 11/15/22  LANDMARK RIGHT (dominant)  R LEG (A-D) 3028.9 ml  R THIGH (E-G) 4809.60 ml  R FULL LIMB (A-G) 7838.5 ml  Limb Volume differential (LVD)  %  Volume change since initial %  Volume change overall V  (Blank rows = not tested)  LANDMARK LEFT  (Rx)  L LEG (A-D) 3189.9 ml  L THIGH (E-G) 4959.8 ml  L FULL LIMB (A-G) 8149.7 ml  Limb Volume differential (LVD)  LEG LVD = 5.32%, L>R; THIGH LVD = 3.12%, L>R; And full LLE LVD = 3.97%, L>R.   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  Skin  Description Hyper-Keratosis Peau d' Orange Shiny Tight Fibrotic/ Indurated Fatty Doughy Spongy/ boggy       R>L x  x   Skin dry Flaky WNL Macerated   mildly      Color Redness Varicosities Blanching Hemosiderin Stain Mottled        x   Odor Malodorous Yeast Fungal infection  WNL      x   Temperature Warm Cool wnl     x    Pitting Edema   1+ 2+ 3+ 4+ Non-pitting         x   Girth Symmetrical Asymmetrical                   Distribution    L>R toes to groin    Stemmer Sign Positive Negative   +L -R   Lymphorrhea History Of:  Present Absent     x    Wounds History Of Present Absent Venous Arterial Pressure Sheer     x        Signs of Infection Redness Warmth Erythema Acute Swelling Drainage Borders                    Sensation Light Touch Deep pressure  Hypersensitivity   In tact Impaired In tact Impaired Absent Impaired   x  x  x     Nails WNL   Fungus nail dystrophy   x     Hair Growth Symmetrical Asymmetrical   x    Skin Creases Base of toes  Ankles   Base of Fingers knees       Abdominal pannus Thigh Lobules  Face/neck   x           GAIT: Distance walked: >500' Assistive device utilized: None Level of assistance: Complete Independence Comments: Functional ambulation and transfers [facility]  LYMPHEDEMA LIFE IMPACT SCALE (LLIS): Initial 11/15/22  50%  FOTO functional outcome measure: Deferred. FOTO discontinued.  TODAY'S TREATMENT:  MLD to BLQ emphasis on colon and LLE Pt edu for le SELF CARE  PATIENT EDUCATION:  Continued Pt/ CG edu for how function of cisterna chyli, part of the thoracic duct of deep abdominal lymphatics, assists with digestion by filtering many of the fats and proteins from the digestive system. Sub optimal lymphatic flow in this region may result in constipation, bloating, swelling, etc. MLD helps mobilize these protein and fats , and the rhythmic movement of manual lymphatic drainage stimulates digestive muscles and increase peristalsis. Provided printed resource for reference.  Topics include outcome of comparative limb volumetrics- starting limb volume differentials (LVDs), technology and gradient techniques used for short stretch, multilayer compression wrapping, simple self-MLD, therapeutic lymphatic pumping exercises, skin/nail care, LE precautions,. compression garment recommendations and specifications, wear and care schedule and compression garment donning / doffing w assistive devices. Discussed progress towards all OT goals since commencing CDT. All questions answered to the Pt's satisfaction. Good return. Person educated: Patient  Education method: Explanation,  Demonstration, and Handouts Education comprehension: verbalized understanding, returned demonstration, verbal cues required, and needs further education  HOME EXERCISE PROGRAM: BLE lymphatic pumping there ex using- 1 sets of 10 reps, each exercise in order-  1-2 x daily, bilaterally Simple self MLD 1 x daily Daily skin care to increase hydration, skin mobility and decrease infection risk- can be done during MLD Compression wraps 23/7 during intensive phase of CDT Compression garments/ devices during self-management phase of CDT  ASSESSMENT:  CLINICAL IMPRESSION:    Pt continues to tolerate and respond well to CDT for deep abdominal lymphatics and for LLE, although the focus of Rx has shifted more towards reducing gut symptoms and LLE is relatively stable. Pt notes that she has not needed a colon cleanse for 1.5 months and w 1-2 x weekly MLD she no longer needs medication for constipation.  Today Pt tolerated abdominal MLD without increased pain.  Performed J strokes to descending, transverse and ascending colon region , then utilized deep abdominal lymphatic pathways, including alternate pressure and release on descending, then repeated transverse, then ascending colon and diaphragmatic breathing. Provided MLD to LLE as established with good tolerance. Pt  continues to benefit from skilled OT for Lymphedema care to reduce pain and digestive discomfort. Cont  1 x weekly as per POC.  11/15/22 Initial Evaluation: Prudence Heiny is a 53 y.o. female presenting with very mild, stage II, LLE lymphedema 2/2 venous insufficiency and obesity. L Leg swelling and associated pain swelling fluctuates. It has progressed over time, and now no longer resolves with elevation over night. RLE swelling limits balance during functional ambulation. It exacerbates infection and falls risk. LLE/RLQ lymphedema interferes with functional performance in all occupational domains, including basic and instrumental ADLs, productive  activities, leisure pursuits and social participation. Pt will benefit from Occupational Therapy for Complete Decongestive Therapy (CDT) to restore function, to reduce physical and psychologic suffering associated with chronic, progressive lymphedema and associated pain, and to limit infection. CDT will include manual lymphatic drainage (MLD), skin care, therapeutic exercise and compression wraps and garment's devices. Without skilled OT for lymphedema care the condition will worsen and further functional decline is expected  Custom-made gradient compression garments and HOS devices are medically necessary because they are uniquely sized and shaped to fit the exact dimensions of the affected extremities, and to provide appropriate medical grade, graduated compression essential for optimally managing chronic, progressive lymphedema. Multiple custom compression garments are needed to ensure proper hygiene to limit infection risk. Custom compression garments  should be replaced q 3-6 months When worn consistently for optimal lipo-lymphedema self-management over time. HOS devices, medically necessary to limit fibrosis buildup in tissue, should be replaced q 2 years and PRN when worn out.      OBJECTIVE IMPAIRMENTS: decreased activity tolerance, decreased knowledge of condition, decreased knowledge of use of DME, increased edema, impaired sensation, pain, and chronic, progressive leg swelling .   ACTIVITY LIMITATIONS: dependent sitting, extended standing and/ or walking, squatting, and lower body dressing, fitting preferred street shoes  PARTICIPATION LIMITATIONS: shopping, community activity, occupation, and yard work  PERSONAL FACTORS: 3+ comorbidities: Hx DVT,  OSA, CVI. Varicose veins  are also affecting patient's functional outcome.   REHAB POTENTIAL: Good  EVALUATION COMPLEXITY: Moderate   GOALS: Goals reviewed with patient? Yes  SHORT TERM GOALS: Target date: 4th OT Rx visit  SHORT TERM  GOALS: Target date: 4th OT Rx visit   Pt will demonstrate understanding of lymphedema precautions and prevention strategies with modified independence using a printed reference to identify at least 5 precautions and discussing how s/he may implement them into daily life to reduce risk of progression with extra time. Baseline:Max A Goal status: GOAL MET   Pt will be able to apply multilayer, knee length, gradient, compression wraps to one leg at a time with modified assistance (extra time and assistive device/s) to decrease limb volume, to limit infection risk, and to limit lymphedema progression.  Baseline: Dependent Goal status: GOAL DISCHARGED. Pt Going to compression garments instead  LONG TERM GOALS: Target date: 02/08/23  Given this patient's Intake score of 67%/100% on the functional outcomes FOTO tool, patient will experience an increase in function of 5 points to improve basic and instrumental ADLs performance, including lymphedema self-care.  Baseline: Max A Goal status:GOAL DISCHARGED.  FOTO TOOL DISCONTINUED AT CLINIC   Given this patient's Intake score of 50% on the Lymphedema Life Impact Scale (LLIS), patient will experience a reduction of at least 5 points in her perceived level of functional impairment resulting from lymphedema to improve functional performance and quality of life (QOL). Baseline: 50% Goal status: PROGRESSING  Pt will achieve at least a 10% volume reduction in B legs to return limb to typical size and shape, to limit infection risk and LE progression, to decrease pain, to improve function. Baseline: Dependent Goal status: PROGRESSING  4.  Pt will obtain proper compression garments/devices and achieve modified independence (extra time + assistive devices) with donning/doffing to optimize limb volume reductions and limit LE  progression over time. Baseline:  Goal status: ONGOING.  5.  During Intensive phase CDT , with modified independence, Pt will achieve at  least 85% compliance with all lymphedema self-care home program components, including daily skin care, compression wraps and /or garments, simple self MLD and lymphatic pumping therex to habituate LE self care protocol  into ADLs for optimal LE self-management over time. Baseline: Dependent Goal status: PROGRESSING   PLAN:  OT FREQUENCY: 1 - 2 x/week  OT DURATION: 12 weeks  PLANNED INTERVENTIONS: 97110-Therapeutic exercises, 97530- Therapeutic activity, 97535- Self Care, 16109- Manual therapy, Patient/Family education, Manual lymph drainage, Compression bandaging, and fit with appropriate compression garments  PLAN FOR NEXT SESSION:  Cont MLD Cont Pt edu for LE self-care  Arnold Bicker, MS, OTR/L, CLT-LANA 04/26/23 11:36 AM

## 2023-04-27 ENCOUNTER — Ambulatory Visit: Admitting: Occupational Therapy

## 2023-04-27 DIAGNOSIS — I890 Lymphedema, not elsewhere classified: Secondary | ICD-10-CM | POA: Diagnosis not present

## 2023-04-27 NOTE — Therapy (Signed)
 OUTPATIENT OCCUPATIONAL THERAPY TREATMENT NOTE   L LOWER EXTREMITY/ B LOWER QUADRANT LYMPHEDEMA  Patient Name: Karen [name] MRN: [id number] DOB:[birth year], 53 y.o., female Today's Date: 04/27/2023  END OF SESSION:   OT End of Session - 04/27/23 1256     Visit Number 37    Number of Visits 72    Date for OT Re-Evaluation 05/17/23    OT Start Time 1005    OT Stop Time 1100    OT Time Calculation (min) 55 min    Activity Tolerance Patient tolerated treatment well;No increased pain    Behavior During Therapy [facility] for tasks assessed/performed               Past Medical History:  Diagnosis Date   Adenomyosis    Anemia    Arthritis 2013   L knee gets steroid injections    Asthmatic bronchitis 01/31/2017   Back pain    Chronic constipation    Diarrhea    DVT of lower extremity (deep venous thrombosis) (HCC)    Dysmenorrhea    Endometriosis    Esophageal stricture    G6PD deficiency    GERD (gastroesophageal reflux disease)    History of kidney stones    History of uterine fibroid    IBS (irritable bowel syndrome)    Interstitial cystitis    Lactose intolerance    Migraine    with aura   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   PONV (postoperative nausea and vomiting)    Recurrent upper respiratory infection (URI)    Sebaceous cyst of breast, right lower inner quadrant 10/25/2012   Excised 11/21/12    Sleep apnea    Syncope, non cardiac    Past Surgical History:  Procedure Laterality Date   ARTHROSCOPIC HAGLUNDS REPAIR     BREAST CYST EXCISION Right 11/21/2012   Procedure: CYST EXCISION BREAST;  Surgeon: Darcella Earnest, MD;  Location: [facility];  Service: General;  Laterality: Right;   BREAST CYST EXCISION Bilateral 01/18/2022   Procedure: EXCISION OF SEBACEOUS CYST BILATERAL BREAST;  Surgeon: Sim Dryer, MD;  Location: [facility];  Service: General;  Laterality: Bilateral;   BREAST EXCISIONAL BIOPSY Right 11/2012    CESAREAN SECTION  04/19/1993   CHOLECYSTECTOMY     COLONOSCOPY  05/02/2011   Procedure: COLONOSCOPY;  Surgeon: Venson Ginger., MD;  Location: [facility];  Service: Endoscopy;  Laterality: N/A;   colonoscopy  11/04/2014   DENTAL SURGERY  03/06/2018   2 surgeries ( 03/06/2018 and 04/06/2018)   to remove two separate benign tumors   ESOPHAGEAL MANOMETRY N/A 11/04/2015   Procedure: ESOPHAGEAL MANOMETRY (EM);  Surgeon: Jolinda Necessary, MD;  Location: [facility];  Service: Endoscopy;  Laterality: N/A;   ESOPHAGOGASTRODUODENOSCOPY  05/02/2011   Procedure: ESOPHAGOGASTRODUODENOSCOPY (EGD);  Surgeon: Venson Ginger., MD;  Location: [facility];  Service: Endoscopy;  Laterality: N/A;   ESOPHAGOGASTRODUODENOSCOPY N/A 09/01/2014   Procedure: ESOPHAGOGASTRODUODENOSCOPY (EGD);  Surgeon: Jolinda Necessary, MD;  Location: [facility];  Service: Endoscopy;  Laterality: N/A;   KNEE SURGERY     LAPAROSCOPY     x 4   LESION REMOVAL N/A 01/18/2022   Procedure: EXCISION OF SEBACEOUS CYSTS CHEST AND NECK;  Surgeon: Sim Dryer, MD;  Location:  SUR[facility]ERY C[facility];  Service: General;  Laterality: N/A;   PH IMPEDANCE STUDY N/A 11/04/2015   Procedure: PH IMPEDANCE STUDY;  Surgeon: Jolinda Necessary, MD;  Location: [facility];  Service: Endoscopy;  Laterality: N/A;   VAGINAL  HYSTERECTOMY  2010   TVH--ovaries remain   Patient Active Problem List   Diagnosis Date Noted   Sjogren syndrome with keratoconjunctivitis (HCC) 12/09/2022   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   Elevated LFTs 04/04/2021   Colitis 04/04/2021   Bacterial overgrowth syndrome 05/21/2020   Obesity 05/21/2020   History of endometriosis 05/21/2020   Constipation due to outlet dysfunction 02/05/2020   Gastroesophageal reflux disease 02/05/2020   Obstructive sleep apnea treated with continuous positive airway pressure (CPAP) 06/16/2017   Perennial allergic rhinitis with a nonallergic component 01/31/2017   Asthmatic bronchitis  01/31/2017   GI symptoms 01/31/2017   Family history of colon cancer 01/27/2015   Chest pain 12/13/2012   Dyspnea 12/13/2012   DVT of lower extremity (deep venous thrombosis) (HCC) 01/03/1990    PCP: Genny Kid, MD  REFERRING PROVIDER: Arva Lathe, MD  REFERRING DIAG: I89.0  THERAPY DIAG:  Lymphedema, not elsewhere classified  Rationale for Evaluation and Treatment: Rehabilitation  ONSET DATE: ~2014; exacerbation in 2023  SUBJECTIVE:                                                                                                                                                                                          SUBJECTIVE STATEMENT:  Pt presents for OT to address lower quadrant and LLE lymphedema. Pt does not rate LE-related pain this morning. Pt has no new complaints or concerns. Pt states deep abdominal technique and abdominal MLD continues to reduce intestinal discomfort / pain and assist w colon function.   PERTINENT HISTORY: CVI, OSA (no CPAP), GERD, Esophageal stricture, IBS, Lactose intolerant, Diarrhea, Hx LE DVT  PAIN:  Are you having pain? Yes: Pls see SUBJECTIVE Pain location: LLE, digestive discomfort Pain description: myalgia, tight, heavy, tingling, numbness Aggravating factors: standing, walking, dependent sitting > 30 minutes Relieving factors: elevation  PRECAUTIONS: Other: LYMPHEDEMA; Hx LLE DVT  RED FLAGS: Hx LLE DVT  WEIGHT BEARING RESTRICTIONS: No  FALLS:  Has patient fallen in last 6 months? No  LIVING ENVIRONMENT: Lives with: lives with spouse and daughter Lives in: House/apartment Stairs: Yes; Internal: 14 steps; yes and External: 4 steps; yes Has following equipment at home: None  OCCUPATION: [employer] operational officer, walking, desk work  LEISURE: family time  HAND DOMINANCE: right   PRIOR LEVEL OF FUNCTION: Independent  PATIENT GOALS: Feel better, be able to be more active without pain, wear preferred street  shoes  OBJECTIVE: Note: Objective measures were completed at Evaluation unless otherwise noted.  COGNITION:  Overall cognitive status: Within functional limits for tasks assessed   POSTURE: WFL  LE ROM: WFL  LE MMT: WFL  LYMPHEDEMA ASSESSMENTS: non-Ca  related  INFECTIONS: denies cellulitis and wound hx  BLE COMPARATIVE LIMB VOLUMETRICS Initial 11/15/22  LANDMARK RIGHT (dominant)  R LEG (A-D) 3028.9 ml  R THIGH (E-G) 4809.60 ml  R FULL LIMB (A-G) 7838.5 ml  Limb Volume differential (LVD)  %  Volume change since initial %  Volume change overall V  (Blank rows = not tested)  LANDMARK LEFT  (Rx)  L LEG (A-D) 3189.9 ml  L THIGH (E-G) 4959.8 ml  L FULL LIMB (A-G) 8149.7 ml  Limb Volume differential (LVD)  LEG LVD = 5.32%, L>R; THIGH LVD = 3.12%, L>R; And full LLE LVD = 3.97%, L>R.   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  Skin  Description Hyper-Keratosis Peau d' Orange Shiny Tight Fibrotic/ Indurated Fatty Doughy Spongy/ boggy       R>L x  x   Skin dry Flaky WNL Macerated   mildly      Color Redness Varicosities Blanching Hemosiderin Stain Mottled        x   Odor Malodorous Yeast Fungal infection  WNL      x   Temperature Warm Cool wnl     x    Pitting Edema   1+ 2+ 3+ 4+ Non-pitting         x   Girth Symmetrical Asymmetrical                   Distribution    L>R toes to groin    Stemmer Sign Positive Negative   +L -R   Lymphorrhea History Of:  Present Absent     x    Wounds History Of Present Absent Venous Arterial Pressure Sheer     x         Signs of Infection Redness Warmth Erythema Acute Swelling Drainage Borders                    Sensation Light Touch Deep pressure Hypersensitivity   In tact Impaired In tact Impaired Absent Impaired   x  x  x     Nails WNL   Fungus nail dystrophy   x     Hair Growth Symmetrical Asymmetrical   x    Skin Creases Base of toes  Ankles   Base of Fingers knees       Abdominal pannus Thigh Lobules  Face/neck   x           GAIT: Distance walked: >500' Assistive device utilized: None Level of assistance: Complete Independence Comments: Functional ambulation and transfers [facility]  LYMPHEDEMA LIFE IMPACT SCALE (LLIS): Initial 11/15/22  50%  FOTO functional outcome measure: Deferred. FOTO discontinued.  TODAY'S TREATMENT:  MLD to BLQ emphasis on colon and LLE Pt edu for le SELF CARE  PATIENT EDUCATION:  Continued Pt/ CG edu for how function of cisterna chyli, part of the thoracic duct of deep abdominal lymphatics, assists with digestion by filtering many of the fats and proteins from the digestive system. Sub optimal lymphatic flow in this region may result in constipation, bloating, swelling, etc. MLD helps mobilize these protein and fats , and the rhythmic movement of manual lymphatic drainage stimulates digestive muscles and increase peristalsis. Provided printed resource for reference.  Topics include outcome of comparative limb volumetrics- starting limb volume differentials (LVDs), technology and gradient techniques used for short stretch, multilayer compression wrapping, simple self-MLD, therapeutic lymphatic pumping exercises, skin/nail care, LE precautions,. compression garment recommendations and specifications, wear and care schedule and compression garment donning / doffing w assistive devices. Discussed progress towards all OT goals since  commencing CDT. All questions answered to the Pt's satisfaction. Good return. Person educated: Patient  Education method: Explanation, Demonstration, and Handouts Education comprehension: verbalized understanding, returned demonstration, verbal cues required, and needs further education  HOME EXERCISE PROGRAM: BLE lymphatic pumping there ex using- 1 sets of 10 reps, each exercise in order-  1-2 x daily, bilaterally Simple self MLD 1 x daily Daily skin care to increase hydration, skin mobility and decrease infection risk- can be done during MLD Compression wraps 23/7 during intensive phase of CDT Compression garments/ devices during self-management phase of CDT  ASSESSMENT:  CLINICAL IMPRESSION:    Pt continues to tolerate and respond well to CDT for deep abdominal lymphatics and for LLE, although the focus of Rx has shifted more towards reducing gut symptoms and LLE is relatively stable. Pt notes that she has not needed a colon cleanse for 1.5 months and w 1-2 x weekly MLD she no longer needs medication for constipation.  Today Pt tolerated abdominal MLD without increased pain.  Performed J strokes to descending, transverse and ascending colon region , then utilized deep abdominal lymphatic pathways, including alternate pressure and release on descending, then repeated transverse, then ascending colon and diaphragmatic breathing. Provided MLD to LLE as established with good tolerance. Pt  continues to benefit from skilled OT for Lymphedema care to reduce pain and digestive discomfort. Cont  1 x weekly as per POC.  11/15/22 Initial Evaluation: Leshonda Galambos is a 53 y.o. female presenting with very mild, stage II, LLE lymphedema 2/2 venous insufficiency and obesity. L Leg swelling and associated pain swelling fluctuates. It has progressed over time, and now no longer resolves with elevation over night. RLE swelling limits balance during functional ambulation. It exacerbates infection and falls risk.  LLE/RLQ lymphedema interferes with functional performance in all occupational domains, including basic and instrumental ADLs, productive activities, leisure pursuits and social participation. Pt will benefit from Occupational Therapy for Complete Decongestive Therapy (CDT) to restore function, to reduce physical and psychologic suffering associated with chronic, progressive lymphedema and associated pain, and to limit infection. CDT will include manual lymphatic drainage (MLD), skin care, therapeutic exercise and compression wraps and garment's devices. Without skilled OT for lymphedema care the condition will worsen and further functional decline is expected  Custom-made gradient compression garments and HOS devices are medically necessary because they are uniquely sized and shaped to fit the exact dimensions of the affected extremities, and to provide appropriate medical grade, graduated compression essential for optimally managing chronic, progressive lymphedema. Multiple custom compression garments are needed to ensure proper hygiene to limit infection risk. Custom compression garments  should be replaced q 3-6 months When worn consistently for optimal lipo-lymphedema self-management over time. HOS devices, medically necessary to limit fibrosis buildup in tissue, should be replaced q 2 years and PRN when worn out.      OBJECTIVE IMPAIRMENTS: decreased activity tolerance, decreased knowledge of condition, decreased knowledge of use of DME, increased edema, impaired sensation, pain, and chronic, progressive leg swelling .   ACTIVITY LIMITATIONS: dependent sitting, extended standing and/ or walking, squatting, and lower body dressing, fitting preferred street shoes  PARTICIPATION LIMITATIONS: shopping, community activity, occupation, and yard work  PERSONAL FACTORS: 3+ comorbidities: Hx DVT,  OSA, CVI. Varicose veins  are also affecting patient's functional outcome.   REHAB POTENTIAL: Good  EVALUATION  COMPLEXITY: Moderate   GOALS: Goals reviewed with patient? Yes  SHORT TERM GOALS: Target date: 4th OT Rx visit  SHORT TERM GOALS: Target date: 4th OT Rx visit   Pt will demonstrate understanding of lymphedema precautions and prevention strategies with modified independence using a printed reference to identify at least 5 precautions and discussing how s/he may implement them into daily life to reduce risk of progression with extra time. Baseline:Max A Goal status: GOAL MET   Pt will be able to apply multilayer, knee length, gradient, compression wraps to one leg at a time with modified assistance (extra time and assistive device/s) to decrease limb volume, to limit infection risk, and to limit lymphedema progression.  Baseline: Dependent Goal status: GOAL DISCHARGED. Pt Going to compression garments instead  LONG TERM GOALS: Target date: 02/08/23  Given this patient's Intake score of 67%/100% on the functional outcomes FOTO tool, patient will experience an increase in function of 5 points to improve basic and instrumental ADLs performance, including lymphedema self-care.  Baseline: Max A Goal status:GOAL DISCHARGED.  FOTO TOOL DISCONTINUED AT CLINIC   Given this patient's Intake score of 50% on the Lymphedema Life Impact Scale (LLIS), patient will experience a reduction of at least 5 points in her perceived level of functional impairment resulting from lymphedema to improve functional performance and quality of life (QOL). Baseline: 50% Goal status: PROGRESSING  Pt will achieve at least a 10% volume reduction in B legs to return limb to typical size and shape, to limit infection risk and LE progression, to decrease pain, to improve function. Baseline: Dependent Goal status: PROGRESSING  4.  Pt will obtain proper compression garments/devices and achieve modified independence (extra time + assistive devices) with donning/doffing to optimize limb volume reductions and limit LE  progression  over time. Baseline:  Goal status: ONGOING.  5.  During Intensive phase CDT , with modified independence, Pt will achieve at least 85% compliance with all lymphedema self-care home program components, including daily skin care, compression wraps and /or garments, simple self MLD and lymphatic pumping therex to habituate LE self care protocol  into ADLs for optimal LE self-management over time. Baseline: Dependent Goal status: PROGRESSING   PLAN:  OT FREQUENCY: 1 - 2 x/week  OT DURATION: 12 weeks  PLANNED INTERVENTIONS: 97110-Therapeutic exercises, 97530- Therapeutic activity, 97535- Self Care, 16109- Manual therapy, Patient/Family education, Manual lymph drainage, Compression bandaging, and fit with appropriate compression garments  PLAN FOR NEXT SESSION:  Cont MLD Cont Pt edu for LE self-care  Arnold Bicker, MS, OTR/L, CLT-LANA 04/27/23 12:58 PM

## 2023-05-02 ENCOUNTER — Encounter: Payer: Self-pay | Admitting: Occupational Therapy

## 2023-05-02 ENCOUNTER — Ambulatory Visit: Admitting: Occupational Therapy

## 2023-05-02 DIAGNOSIS — I890 Lymphedema, not elsewhere classified: Secondary | ICD-10-CM

## 2023-05-02 NOTE — Therapy (Signed)
 OUTPATIENT OCCUPATIONAL THERAPY TREATMENT NOTE   BLE/ BLQ LYMPHEDEMA  Patient Name: Karen [name] MRN: [id number] DOB:10/05/1970, 52 y.o., female Today's Date: 05/02/2023  END OF SESSION:   OT End of Session - 05/02/23 0809     Visit Number 38    Number of Visits 72    Date for OT Re-Evaluation 05/17/23    OT Start Time 0802    OT Stop Time 0902    OT Time Calculation (min) 60 min    Activity Tolerance Patient tolerated treatment well;No increased pain    Behavior During Therapy [facility] for tasks assessed/performed               Past Medical History:  Diagnosis Date   Adenomyosis    Anemia    Arthritis 2013   L knee gets steroid injections    Asthmatic bronchitis 01/31/2017   Back pain    Chronic constipation    Diarrhea    DVT of lower extremity (deep venous thrombosis) (HCC)    Dysmenorrhea    Endometriosis    Esophageal stricture    G6PD deficiency    GERD (gastroesophageal reflux disease)    History of kidney stones    History of uterine fibroid    IBS (irritable bowel syndrome)    Interstitial cystitis    Lactose intolerance    Migraine    with aura   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   PONV (postoperative nausea and vomiting)    Recurrent upper respiratory infection (URI)    Sebaceous cyst of breast, right lower inner quadrant 10/25/2012   Excised 11/21/12    Sleep apnea    Syncope, non cardiac    Past Surgical History:  Procedure Laterality Date   ARTHROSCOPIC HAGLUNDS REPAIR     BREAST CYST EXCISION Right 11/21/2012   Procedure: CYST EXCISION BREAST;  Surgeon: Darcella Earnest, MD;  Location: [facility];  Service: General;  Laterality: Right;   BREAST CYST EXCISION Bilateral 01/18/2022   Procedure: EXCISION OF SEBACEOUS CYST BILATERAL BREAST;  Surgeon: Sim Dryer, MD;  Location: [facility];  Service: General;  Laterality: Bilateral;   BREAST EXCISIONAL BIOPSY Right 11/2012   CESAREAN SECTION  04/19/1993    CHOLECYSTECTOMY     COLONOSCOPY  05/02/2011   Procedure: COLONOSCOPY;  Surgeon: Venson Ginger., MD;  Location: [facility];  Service: Endoscopy;  Laterality: N/A;   colonoscopy  11/04/2014   DENTAL SURGERY  03/06/2018   2 surgeries ( 03/06/2018 and 04/06/2018)   to remove two separate benign tumors   ESOPHAGEAL MANOMETRY N/A 11/04/2015   Procedure: ESOPHAGEAL MANOMETRY (EM);  Surgeon: Jolinda Necessary, MD;  Location: [facility];  Service: Endoscopy;  Laterality: N/A;   ESOPHAGOGASTRODUODENOSCOPY  05/02/2011   Procedure: ESOPHAGOGASTRODUODENOSCOPY (EGD);  Surgeon: Venson Ginger., MD;  Location: [facility];  Service: Endoscopy;  Laterality: N/A;   ESOPHAGOGASTRODUODENOSCOPY N/A 09/01/2014   Procedure: ESOPHAGOGASTRODUODENOSCOPY (EGD);  Surgeon: Jolinda Necessary, MD;  Location: [facility];  Service: Endoscopy;  Laterality: N/A;   KNEE SURGERY     LAPAROSCOPY     x 4   LESION REMOVAL N/A 01/18/2022   Procedure: EXCISION OF SEBACEOUS CYSTS CHEST AND NECK;  Surgeon: Sim Dryer, MD;  Location: [facility];  Service: General;  Laterality: N/A;   PH IMPEDANCE STUDY N/A 11/04/2015   Procedure: PH IMPEDANCE STUDY;  Surgeon: Jolinda Necessary, MD;  Location: [facility];  Service: Endoscopy;  Laterality: N/A;   VAGINAL HYSTERECTOMY  2010  TVH--ovaries remain   Patient Active Problem List   Diagnosis Date Noted   Sjogren syndrome with keratoconjunctivitis (HCC) 12/09/2022   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   Elevated LFTs 04/04/2021   Colitis 04/04/2021   Bacterial overgrowth syndrome 05/21/2020   Obesity 05/21/2020   History of endometriosis 05/21/2020   Constipation due to outlet dysfunction 02/05/2020   Gastroesophageal reflux disease 02/05/2020   Obstructive sleep apnea treated with continuous positive airway pressure (CPAP) 06/16/2017   Perennial allergic rhinitis with a nonallergic component 01/31/2017   Asthmatic bronchitis 01/31/2017   GI symptoms 01/31/2017    Family history of colon cancer 01/27/2015   Chest pain 12/13/2012   Dyspnea 12/13/2012   DVT of lower extremity (deep venous thrombosis) (HCC) 01/03/1990    PCP: Genny Kid, MD  REFERRING PROVIDER: Arva Lathe, MD  REFERRING DIAG: I89.0  THERAPY DIAG:  Lymphedema, not elsewhere classified  Rationale for Evaluation and Treatment: Rehabilitation  ONSET DATE: ~2014; exacerbation in 2023  SUBJECTIVE:                                                                                                                                                                                          SUBJECTIVE STATEMENT:  Pt presents for OT to address lower quadrant and LLE lymphedema. Pt rates LE-related pain/ discomfort at 2/10 this morning. Pt has no new complaints or concerns. Pt states deep abdominal technique and abdominal MLD continues to reduce intestinal discomfort / pain and assist w outlet dysfunction by eliminating need for Miralax  for constipation relief over past 2 months.  PERTINENT HISTORY: CVI, OSA (no CPAP), GERD, Esophageal stricture, IBS, Lactose intolerant, Diarrhea, Hx LE DVT  PAIN:  Are you having pain? Yes: Pls see SUBJECTIVE Pain location: LLE, digestive discomfort Pain description: myalgia, tight, heavy, tingling, numbness Aggravating factors: standing, walking, dependent sitting > 30 minutes Relieving factors: elevation  PRECAUTIONS: Other: LYMPHEDEMA; Hx LLE DVT  RED FLAGS: Hx LLE DVT  WEIGHT BEARING RESTRICTIONS: No  FALLS:  Has patient fallen in last 6 months? No  LIVING ENVIRONMENT: Lives with: lives with spouse and daughter Lives in: House/apartment Stairs: Yes; Internal: 14 steps; yes and External: 4 steps; yes Has following equipment at home: None  OCCUPATION: [employer] operational officer, walking, desk work  LEISURE: family time  HAND DOMINANCE: right   PRIOR LEVEL OF FUNCTION: Independent  PATIENT GOALS: Feel better, be able to be more active  without pain, wear preferred street shoes  OBJECTIVE: Note: Objective measures were completed at Evaluation unless otherwise noted.  COGNITION:  Overall cognitive status: Within functional limits for tasks assessed   POSTURE: WFL  LE ROM: [facility]  LE MMT: WFL  LYMPHEDEMA ASSESSMENTS: non-Ca related  INFECTIONS: denies cellulitis and wound hx  BLE COMPARATIVE LIMB VOLUMETRICS Initial 11/15/22  LANDMARK RIGHT (dominant)  R LEG (A-D) 3028.9 ml  R THIGH (E-G) 4809.60 ml  R FULL LIMB (A-G) 7838.5 ml  Limb Volume differential (LVD)  %  Volume change since initial %  Volume change overall V  (Blank rows = not tested)  LANDMARK LEFT  (Rx)  L LEG (A-D) 3189.9 ml  L THIGH (E-G) 4959.8 ml  L FULL LIMB (A-G) 8149.7 ml  Limb Volume differential (LVD)  LEG LVD = 5.32%, L>R; THIGH LVD = 3.12%, L>R; And full LLE LVD = 3.97%, L>R.   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  Skin  Description Hyper-Keratosis Peau d' Orange Shiny Tight Fibrotic/ Indurated Fatty Doughy Spongy/ boggy       R>L x  x   Skin dry Flaky WNL Macerated   mildly      Color Redness Varicosities Blanching Hemosiderin Stain Mottled        x   Odor Malodorous Yeast Fungal infection  WNL      x   Temperature Warm Cool wnl     x    Pitting Edema   1+ 2+ 3+ 4+ Non-pitting         x   Girth Symmetrical Asymmetrical                   Distribution    L>R toes to groin    Stemmer Sign Positive Negative   +L -R   Lymphorrhea History Of:  Present Absent     x    Wounds History Of Present Absent Venous  Arterial Pressure Sheer     x        Signs of Infection Redness Warmth Erythema Acute Swelling Drainage Borders                    Sensation Light Touch Deep pressure Hypersensitivity   In tact Impaired In tact Impaired Absent Impaired   x  x  x     Nails WNL   Fungus nail dystrophy   x     Hair Growth Symmetrical Asymmetrical   x    Skin Creases Base of toes  Ankles   Base of Fingers knees       Abdominal pannus Thigh Lobules  Face/neck   x           GAIT: Distance walked: >500' Assistive device utilized: None Level of assistance: Complete Independence Comments: Functional ambulation and transfers [facility]  LYMPHEDEMA LIFE IMPACT SCALE (LLIS): Initial 11/15/22  50%  FOTO functional outcome measure: Deferred. FOTO discontinued.  TODAY'S TREATMENT:  MLD to BLQ emphasis on colon and LLE Pt edu for le SELF CARE  PATIENT EDUCATION:  Continued Pt/ CG edu for how function of cisterna chyli, part of the thoracic duct of deep abdominal lymphatics, assists with digestion by filtering many of the fats and proteins from the digestive system. Sub optimal lymphatic flow in this region may result in constipation, bloating, swelling, etc. MLD helps mobilize these protein and fats , and the rhythmic movement of manual lymphatic drainage stimulates digestive muscles and increase peristalsis. Provided printed resource for reference.  Topics include outcome of comparative limb volumetrics- starting limb volume differentials (LVDs), technology and gradient techniques used for short stretch, multilayer compression wrapping, simple self-MLD, therapeutic lymphatic pumping exercises, skin/nail care, LE precautions,. compression garment recommendations and specifications, wear and care schedule and compression garment donning / doffing w assistive devices. Discussed  progress towards all OT goals since commencing CDT. All questions answered to the Pt's satisfaction. Good return. Person educated: Patient  Education method: Explanation, Demonstration, and Handouts Education comprehension: verbalized understanding, returned demonstration, verbal cues required, and needs further education  HOME EXERCISE PROGRAM: BLE lymphatic pumping there ex using- 1 sets of 10 reps, each exercise in order-  1-2 x daily, bilaterally Simple self MLD 1 x daily Daily skin care to increase hydration, skin mobility and decrease infection risk- can be done during MLD Compression wraps 23/7 during intensive phase of CDT Compression garments/ devices during self-management phase of CDT  ASSESSMENT:  CLINICAL IMPRESSION:    Pt continues to tolerate and respond well to MLD accessing deep abdominal lymphatics and the ascending, transverse and descending colon as evidenced by reduced gut discomfort and no longer needing Miralax  for constipation. MLD to abdominal lymphatics is known to increase peristalsis and bowel motility, to stimulate the parasympathetic nervous system promoting relaxation of the intestinal sphincter muscles. Pt has also been able to suspend frequent intestinal cleanses.  Pt tolerated abdominal MLD without increased pain.  Performed J strokes to descending, transverse and ascending colon region , then utilized deep abdominal lymphatic pathways, including alternate pressure and release on descending, then repeated transverse, then ascending colon and diaphragmatic breathing. Provided MLD to LLE as established with good tolerance. Pt  continues to benefit from skilled OT for Lymphedema care to reduce pain and digestive discomfort. Cont  1-2 x weekly as per POC.  11/15/22 Initial Evaluation: Ayahna Kary is a 53 y.o. female presenting with very mild, stage II, LLE lymphedema 2/2 venous insufficiency and obesity. L Leg swelling and associated pain swelling fluctuates. It has  progressed over time, and now no longer resolves with elevation over night. RLE swelling limits balance during functional ambulation. It exacerbates infection and falls risk. LLE/RLQ lymphedema interferes with functional performance in all occupational domains, including basic and instrumental ADLs, productive activities, leisure pursuits and social participation. Pt will benefit from Occupational Therapy for Complete Decongestive Therapy (CDT) to restore function, to reduce physical and psychologic suffering associated with chronic, progressive lymphedema and associated pain, and to limit infection. CDT will include manual lymphatic drainage (MLD), skin care, therapeutic exercise and compression wraps and garment's devices. Without skilled OT for lymphedema care the condition will worsen and further functional decline is expected  Custom-made gradient compression garments and HOS devices are medically necessary because they are uniquely sized and shaped to fit the exact dimensions of the affected extremities, and to provide appropriate medical grade, graduated compression essential for optimally managing chronic, progressive lymphedema. Multiple custom compression garments are needed to ensure proper  hygiene to limit infection risk. Custom compression garments should be replaced q 3-6 months When worn consistently for optimal lipo-lymphedema self-management over time. HOS devices, medically necessary to limit fibrosis buildup in tissue, should be replaced q 2 years and PRN when worn out.      OBJECTIVE IMPAIRMENTS: decreased activity tolerance, decreased knowledge of condition, decreased knowledge of use of DME, increased edema, impaired sensation, pain, and chronic, progressive leg swelling .   ACTIVITY LIMITATIONS: dependent sitting, extended standing and/ or walking, squatting, and lower body dressing, fitting preferred street shoes  PARTICIPATION LIMITATIONS: shopping, community activity, occupation,  and yard work  PERSONAL FACTORS: 3+ comorbidities: Hx DVT,  OSA, CVI. Varicose veins  are also affecting patient's functional outcome.   REHAB POTENTIAL: Good  EVALUATION COMPLEXITY: Moderate   GOALS: Goals reviewed with patient? Yes  SHORT TERM GOALS: Target date: 4th OT Rx visit  SHORT TERM GOALS: Target date: 4th OT Rx visit   Pt will demonstrate understanding of lymphedema precautions and prevention strategies with modified independence using a printed reference to identify at least 5 precautions and discussing how s/he may implement them into daily life to reduce risk of progression with extra time. Baseline:Max A Goal status: GOAL MET   Pt will be able to apply multilayer, knee length, gradient, compression wraps to one leg at a time with modified assistance (extra time and assistive device/s) to decrease limb volume, to limit infection risk, and to limit lymphedema progression.  Baseline: Dependent Goal status: GOAL DISCHARGED. Pt Going to compression garments instead  LONG TERM GOALS: Target date: 02/08/23  Given this patient's Intake score of 67%/100% on the functional outcomes FOTO tool, patient will experience an increase in function of 5 points to improve basic and instrumental ADLs performance, including lymphedema self-care.  Baseline: Max A Goal status:GOAL DISCHARGED.  FOTO TOOL DISCONTINUED AT CLINIC   Given this patient's Intake score of 50% on the Lymphedema Life Impact Scale (LLIS), patient will experience a reduction of at least 5 points in her perceived level of functional impairment resulting from lymphedema to improve functional performance and quality of life (QOL). Baseline: 50% Goal status: PROGRESSING  Pt will achieve at least a 10% volume reduction in B legs to return limb to typical size and shape, to limit infection risk and LE progression, to decrease pain, to improve function. Baseline: Dependent Goal status: PROGRESSING  4.  Pt will obtain proper  compression garments/devices and achieve modified independence (extra time + assistive devices) with donning/doffing to optimize limb volume reductions and limit LE  progression over time. Baseline:  Goal status: ONGOING.  5.  During Intensive phase CDT , with modified independence, Pt will achieve at least 85% compliance with all lymphedema self-care home program components, including daily skin care, compression wraps and /or garments, simple self MLD and lymphatic pumping therex to habituate LE self care protocol  into ADLs for optimal LE self-management over time. Baseline: Dependent Goal status: PROGRESSING   PLAN:  OT FREQUENCY: 1 - 2 x/week  OT DURATION: 12 weeks  PLANNED INTERVENTIONS: 97110-Therapeutic exercises, 97530- Therapeutic activity, 97535- Self Care, 09811- Manual therapy, Patient/Family education, Manual lymph drainage, Compression bandaging, and fit with appropriate compression garments  PLAN FOR NEXT SESSION:  Cont MLD Cont Pt edu for LE self-care  Arnold Bicker, MS, OTR/L, CLT-LANA 05/02/23 9:16 AM

## 2023-05-03 ENCOUNTER — Ambulatory Visit: Admitting: Occupational Therapy

## 2023-05-03 DIAGNOSIS — I890 Lymphedema, not elsewhere classified: Secondary | ICD-10-CM | POA: Diagnosis not present

## 2023-05-04 ENCOUNTER — Encounter: Admitting: Occupational Therapy

## 2023-05-04 ENCOUNTER — Ambulatory Visit: Payer: Self-pay | Attending: Obstetrics & Gynecology

## 2023-05-04 DIAGNOSIS — M5459 Other low back pain: Secondary | ICD-10-CM | POA: Insufficient documentation

## 2023-05-04 DIAGNOSIS — R279 Unspecified lack of coordination: Secondary | ICD-10-CM | POA: Diagnosis not present

## 2023-05-04 DIAGNOSIS — R102 Pelvic and perineal pain: Secondary | ICD-10-CM | POA: Diagnosis not present

## 2023-05-04 DIAGNOSIS — M6281 Muscle weakness (generalized): Secondary | ICD-10-CM | POA: Diagnosis not present

## 2023-05-04 DIAGNOSIS — M62838 Other muscle spasm: Secondary | ICD-10-CM | POA: Diagnosis not present

## 2023-05-04 NOTE — Patient Instructions (Signed)
   The first picture shows that there is no effect on the pelvic floor with gravity eliminated. The next three show that with a wedge pillow or a few pillows from home under your pelvis the pelvic floor is inverted and may relax and allows gravity to help return prolapsed areas more inward to help relieve symptoms. Do this 15-20 mins every evening when symptoms tend to be worse. Stop if you have pain or negative symptoms.

## 2023-05-04 NOTE — Therapy (Signed)
 OUTPATIENT OCCUPATIONAL THERAPY TREATMENT NOTE   BLE/ BLQ LYMPHEDEMA  Patient Name: Karen [name] MRN: [id number] DOB:04-10-1970, 53 y.o., female Today's Date: 05/04/2023  END OF SESSION:   OT End of Session - 05/03/23 1404     Visit Number 39    Number of Visits 72    Date for OT Re-Evaluation 05/17/23    OT Start Time 0104    OT Stop Time 0204    OT Time Calculation (min) 60 min    Activity Tolerance Patient tolerated treatment well;No increased pain    Behavior During Therapy [facility] for tasks assessed/performed               Past Medical History:  Diagnosis Date   Adenomyosis    Anemia    Arthritis 2013   L knee gets steroid injections    Asthmatic bronchitis 01/31/2017   Back pain    Chronic constipation    Diarrhea    DVT of lower extremity (deep venous thrombosis) (HCC)    Dysmenorrhea    Endometriosis    Esophageal stricture    G6PD deficiency    GERD (gastroesophageal reflux disease)    History of kidney stones    History of uterine fibroid    IBS (irritable bowel syndrome)    Interstitial cystitis    Lactose intolerance    Migraine    with aura   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   PONV (postoperative nausea and vomiting)    Recurrent upper respiratory infection (URI)    Sebaceous cyst of breast, right lower inner quadrant 10/25/2012   Excised 11/21/12    Sleep apnea    Syncope, non cardiac    Past Surgical History:  Procedure Laterality Date   ARTHROSCOPIC HAGLUNDS REPAIR     BREAST CYST EXCISION Right 11/21/2012   Procedure: CYST EXCISION BREAST;  Surgeon: Darcella Earnest, MD;  Location: [facility];  Service: General;  Laterality: Right;   BREAST CYST EXCISION Bilateral 01/18/2022   Procedure: EXCISION OF SEBACEOUS CYST BILATERAL BREAST;  Surgeon: Sim Dryer, MD;  Location: [facility];  Service: General;  Laterality: Bilateral;   BREAST EXCISIONAL BIOPSY Right 11/2012   CESAREAN SECTION  04/19/1993    CHOLECYSTECTOMY     COLONOSCOPY  05/02/2011   Procedure: COLONOSCOPY;  Surgeon: Venson Ginger., MD;  Location: [facility];  Service: Endoscopy;  Laterality: N/A;   colonoscopy  11/04/2014   DENTAL SURGERY  03/06/2018   2 surgeries ( 03/06/2018 and 04/06/2018)   to remove two separate benign tumors   ESOPHAGEAL MANOMETRY N/A 11/04/2015   Procedure: ESOPHAGEAL MANOMETRY (EM);  Surgeon: Jolinda Necessary, MD;  Location: [facility];  Service: Endoscopy;  Laterality: N/A;   ESOPHAGOGASTRODUODENOSCOPY  05/02/2011   Procedure: ESOPHAGOGASTRODUODENOSCOPY (EGD);  Surgeon: Venson Ginger., MD;  Location: [facility];  Service: Endoscopy;  Laterality: N/A;   ESOPHAGOGASTRODUODENOSCOPY N/A 09/01/2014   Procedure: ESOPHAGOGASTRODUODENOSCOPY (EGD);  Surgeon: Jolinda Necessary, MD;  Location: [facility];  Service: Endoscopy;  Laterality: N/A;   KNEE SURGERY     LAPAROSCOPY     x 4   LESION REMOVAL N/A 01/18/2022   Procedure: EXCISION OF SEBACEOUS CYSTS CHEST AND NECK;  Surgeon: Sim Dryer, MD;  Location:  SURGERY CENTER;  Service: General;  Laterality: N/A;   PH IMPEDANCE STUDY N/A 11/04/2015   Procedure: PH IMPEDANCE STUDY;  Surgeon: Jolinda Necessary, MD;  Location: [facility];  Service: Endoscopy;  Laterality: N/A;   VAGINAL HYSTERECTOMY  2010  TVH--ovaries remain   Patient Active Problem List   Diagnosis Date Noted   Sjogren syndrome with keratoconjunctivitis (HCC) 12/09/2022   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   Elevated LFTs 04/04/2021   Colitis 04/04/2021   Bacterial overgrowth syndrome 05/21/2020   Obesity 05/21/2020   History of endometriosis 05/21/2020   Constipation due to outlet dysfunction 02/05/2020   Gastroesophageal reflux disease 02/05/2020   Obstructive sleep apnea treated with continuous positive airway pressure (CPAP) 06/16/2017   Perennial allergic rhinitis with a nonallergic component 01/31/2017   Asthmatic bronchitis 01/31/2017   GI symptoms 01/31/2017    Family history of colon cancer 01/27/2015   Chest pain 12/13/2012   Dyspnea 12/13/2012   DVT of lower extremity (deep venous thrombosis) (HCC) 01/03/1990    PCP: Genny Kid, MD  REFERRING PROVIDER: Arva Lathe, MD  REFERRING DIAG: I89.0  THERAPY DIAG:  Lymphedema, not elsewhere classified  Rationale for Evaluation and Treatment: Rehabilitation  ONSET DATE: ~2014; exacerbation in 2023  SUBJECTIVE:                                                                                                                                                                                          SUBJECTIVE STATEMENT:  Pt presents for OT to address lower quadrant and LLE lymphedema. Pt rates LE-related pain/ discomfort at 2/10 this morning. Pt has no new complaints or concerns. Pt states deep abdominal technique and abdominal MLD continues to reduce intestinal discomfort / pain and assist w outlet dysfunction by eliminating need for Miralax  for constipation relief over past 2 months.  PERTINENT HISTORY: CVI, OSA (no CPAP), GERD, Esophageal stricture, IBS, Lactose intolerant, Diarrhea, Hx LE DVT  PAIN:  Are you having pain? Yes: Pls see SUBJECTIVE Pain location: LLE, digestive discomfort Pain description: myalgia, tight, heavy, tingling, numbness Aggravating factors: standing, walking, dependent sitting > 30 minutes Relieving factors: elevation  PRECAUTIONS: Other: LYMPHEDEMA; Hx LLE DVT  RED FLAGS: Hx LLE DVT  WEIGHT BEARING RESTRICTIONS: No  FALLS:  Has patient fallen in last 6 months? No  LIVING ENVIRONMENT: Lives with: lives with spouse and daughter Lives in: House/apartment Stairs: Yes; Internal: 14 steps; yes and External: 4 steps; yes Has following equipment at home: None  OCCUPATION: [employer] operational officer, walking, desk work  LEISURE: family time  HAND DOMINANCE: right   PRIOR LEVEL OF FUNCTION: Independent  PATIENT GOALS: Feel better, be able to be more active  without pain, wear preferred street shoes  OBJECTIVE: Note: Objective measures were completed at Evaluation unless otherwise noted.  COGNITION:  Overall cognitive status: Within functional limits for tasks assessed   POSTURE: WFL  LE ROM: [facility]  LE MMT: WFL  LYMPHEDEMA ASSESSMENTS: non-Ca related  INFECTIONS: denies cellulitis and wound hx  BLE COMPARATIVE LIMB VOLUMETRICS Initial 11/15/22  LANDMARK RIGHT (dominant)  R LEG (A-D) 3028.9 ml  R THIGH (E-G) 4809.60 ml  R FULL LIMB (A-G) 7838.5 ml  Limb Volume differential (LVD)  %  Volume change since initial %  Volume change overall V  (Blank rows = not tested)  LANDMARK LEFT  (Rx)  L LEG (A-D) 3189.9 ml  L THIGH (E-G) 4959.8 ml  L FULL LIMB (A-G) 8149.7 ml  Limb Volume differential (LVD)  LEG LVD = 5.32%, L>R; THIGH LVD = 3.12%, L>R; And full LLE LVD = 3.97%, L>R.   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  Skin  Description Hyper-Keratosis Peau d' Orange Shiny Tight Fibrotic/ Indurated Fatty Doughy Spongy/ boggy       R>L x  x   Skin dry Flaky WNL Macerated   mildly      Color Redness Varicosities Blanching Hemosiderin Stain Mottled        x   Odor Malodorous Yeast Fungal infection  WNL      x   Temperature Warm Cool wnl     x    Pitting Edema   1+ 2+ 3+ 4+ Non-pitting         x   Girth Symmetrical Asymmetrical                   Distribution    L>R toes to groin    Stemmer Sign Positive Negative   +L -R   Lymphorrhea History Of:  Present Absent     x    Wounds History Of Present Absent Venous  Arterial Pressure Sheer     x        Signs of Infection Redness Warmth Erythema Acute Swelling Drainage Borders                    Sensation Light Touch Deep pressure Hypersensitivity   In tact Impaired In tact Impaired Absent Impaired   x  x  x     Nails WNL   Fungus nail dystrophy   x     Hair Growth Symmetrical Asymmetrical   x    Skin Creases Base of toes  Ankles   Base of Fingers knees       Abdominal pannus Thigh Lobules  Face/neck   x           GAIT: Distance walked: >500' Assistive device utilized: None Level of assistance: Complete Independence Comments: Functional ambulation and transfers [facility]  LYMPHEDEMA LIFE IMPACT SCALE (LLIS): Initial 11/15/22  50%  FOTO functional outcome measure: Deferred. FOTO discontinued.  TODAY'S TREATMENT:  MLD to BLQ emphasis on colon and LLE Pt edu for le SELF CARE  PATIENT EDUCATION:  Continued Pt/ CG edu for how function of cisterna chyli, part of the thoracic duct of deep abdominal lymphatics, assists with digestion by filtering many of the fats and proteins from the digestive system. Sub optimal lymphatic flow in this region may result in constipation, bloating, swelling, etc. MLD helps mobilize these protein and fats , and the rhythmic movement of manual lymphatic drainage stimulates digestive muscles and increase peristalsis. Provided printed resource for reference.  Topics include outcome of comparative limb volumetrics- starting limb volume differentials (LVDs), technology and gradient techniques used for short stretch, multilayer compression wrapping, simple self-MLD, therapeutic lymphatic pumping exercises, skin/nail care, LE precautions,. compression garment recommendations and specifications, wear and care schedule and compression garment donning / doffing w assistive devices. Discussed  progress towards all OT goals since commencing CDT. All questions answered to the Pt's satisfaction. Good return. Person educated: Patient  Education method: Explanation, Demonstration, and Handouts Education comprehension: verbalized understanding, returned demonstration, verbal cues required, and needs further education  HOME EXERCISE PROGRAM: BLE lymphatic pumping there ex using- 1 sets of 10 reps, each exercise in order-  1-2 x daily, bilaterally Simple self MLD 1 x daily Daily skin care to increase hydration, skin mobility and decrease infection risk- can be done during MLD Compression wraps 23/7 during intensive phase of CDT Compression garments/ devices during self-management phase of CDT  ASSESSMENT:  CLINICAL IMPRESSION:    Pt continues to tolerate and respond well to MLD accessing deep abdominal lymphatics and the ascending, transverse and descending colon as evidenced by reduced gut discomfort and no longer needing Miralax  for constipation. MLD to abdominal lymphatics is known to increase peristalsis and bowel motility, to stimulate the parasympathetic nervous system promoting relaxation of the intestinal sphincter muscles. Pt has also been able to suspend frequent intestinal cleanses.  Pt tolerated abdominal MLD without increased pain.  Performed J strokes to descending, transverse and ascending colon region , then utilized deep abdominal lymphatic pathways, including alternate pressure and release on descending, then repeated transverse, then ascending colon and diaphragmatic breathing. Provided MLD to LLE as established with good tolerance. Pt  continues to benefit from skilled OT for Lymphedema care to reduce pain and digestive discomfort. Cont  1-2 x weekly as per POC.  11/15/22 Initial Evaluation: Karen [name] is a 53 y.o. female presenting with very mild, stage II, LLE lymphedema 2/2 venous insufficiency and obesity. L Leg swelling and associated pain swelling fluctuates. It has  progressed over time, and now no longer resolves with elevation over night. RLE swelling limits balance during functional ambulation. It exacerbates infection and falls risk. LLE/RLQ lymphedema interferes with functional performance in all occupational domains, including basic and instrumental ADLs, productive activities, leisure pursuits and social participation. Pt will benefit from Occupational Therapy for Complete Decongestive Therapy (CDT) to restore function, to reduce physical and psychologic suffering associated with chronic, progressive lymphedema and associated pain, and to limit infection. CDT will include manual lymphatic drainage (MLD), skin care, therapeutic exercise and compression wraps and garment's devices. Without skilled OT for lymphedema care the condition will worsen and further functional decline is expected  Custom-made gradient compression garments and HOS devices are medically necessary because they are uniquely sized and shaped to fit the exact dimensions of the affected extremities, and to provide appropriate medical grade, graduated compression essential for optimally managing chronic, progressive lymphedema. Multiple custom compression garments are needed to ensure proper  hygiene to limit infection risk. Custom compression garments should be replaced q 3-6 months When worn consistently for optimal lipo-lymphedema self-management over time. HOS devices, medically necessary to limit fibrosis buildup in tissue, should be replaced q 2 years and PRN when worn out.      OBJECTIVE IMPAIRMENTS: decreased activity tolerance, decreased knowledge of condition, decreased knowledge of use of DME, increased edema, impaired sensation, pain, and chronic, progressive leg swelling .   ACTIVITY LIMITATIONS: dependent sitting, extended standing and/ or walking, squatting, and lower body dressing, fitting preferred street shoes  PARTICIPATION LIMITATIONS: shopping, community activity, occupation,  and yard work  PERSONAL FACTORS: 3+ comorbidities: Hx DVT,  OSA, CVI. Varicose veins  are also affecting patient's functional outcome.   REHAB POTENTIAL: Good  EVALUATION COMPLEXITY: Moderate   GOALS: Goals reviewed with patient? Yes  SHORT TERM GOALS: Target date: 4th OT Rx visit  SHORT TERM GOALS: Target date: 4th OT Rx visit   Pt will demonstrate understanding of lymphedema precautions and prevention strategies with modified independence using a printed reference to identify at least 5 precautions and discussing how s/he may implement them into daily life to reduce risk of progression with extra time. Baseline:Max A Goal status: GOAL MET   Pt will be able to apply multilayer, knee length, gradient, compression wraps to one leg at a time with modified assistance (extra time and assistive device/s) to decrease limb volume, to limit infection risk, and to limit lymphedema progression.  Baseline: Dependent Goal status: GOAL DISCHARGED. Pt Going to compression garments instead  LONG TERM GOALS: Target date: 02/08/23  Given this patient's Intake score of 67%/100% on the functional outcomes FOTO tool, patient will experience an increase in function of 5 points to improve basic and instrumental ADLs performance, including lymphedema self-care.  Baseline: Max A Goal status:GOAL DISCHARGED.  FOTO TOOL DISCONTINUED AT CLINIC   Given this patient's Intake score of 50% on the Lymphedema Life Impact Scale (LLIS), patient will experience a reduction of at least 5 points in her perceived level of functional impairment resulting from lymphedema to improve functional performance and quality of life (QOL). Baseline: 50% Goal status: PROGRESSING  Pt will achieve at least a 10% volume reduction in B legs to return limb to typical size and shape, to limit infection risk and LE progression, to decrease pain, to improve function. Baseline: Dependent Goal status: PROGRESSING  4.  Pt will obtain proper  compression garments/devices and achieve modified independence (extra time + assistive devices) with donning/doffing to optimize limb volume reductions and limit LE  progression over time. Baseline:  Goal status: ONGOING.  5.  During Intensive phase CDT , with modified independence, Pt will achieve at least 85% compliance with all lymphedema self-care home program components, including daily skin care, compression wraps and /or garments, simple self MLD and lymphatic pumping therex to habituate LE self care protocol  into ADLs for optimal LE self-management over time. Baseline: Dependent Goal status: PROGRESSING   PLAN:  OT FREQUENCY: 1 - 2 x/week  OT DURATION: 12 weeks  PLANNED INTERVENTIONS: 97110-Therapeutic exercises, 97530- Therapeutic activity, 97535- Self Care, 16109- Manual therapy, Patient/Family education, Manual lymph drainage, Compression bandaging, and fit with appropriate compression garments  PLAN FOR NEXT SESSION:  Cont MLD Cont Pt edu for LE self-care  Arnold Bicker, MS, OTR/L, CLT-LANA 05/04/23 9:13 AM

## 2023-05-04 NOTE — Therapy (Signed)
 OUTPATIENT PHYSICAL THERAPY FEMALE PELVIC TREATMENT   Patient Name: Karen [name] MRN: [id number] DOB:1970/08/16, 53 y.o., female Today's Date: 05/04/2023  END OF SESSION:  PT End of Session - 05/04/23 1618     Visit Number 39    Date for PT Re-Evaluation 10/10/23    Authorization Type [employer]    PT Start Time 1617    PT Stop Time 1657    PT Time Calculation (min) 40 min    Activity Tolerance Patient tolerated treatment well    Behavior During Therapy [facility] for tasks assessed/performed                      Past Medical History:  Diagnosis Date   Adenomyosis    Anemia    Arthritis 2013   L knee gets steroid injections    Asthmatic bronchitis 01/31/2017   Back pain    Chronic constipation    Diarrhea    DVT of lower extremity (deep venous thrombosis) (HCC)    Dysmenorrhea    Endometriosis    Esophageal stricture    G6PD deficiency    GERD (gastroesophageal reflux disease)    History of kidney stones    History of uterine fibroid    IBS (irritable bowel syndrome)    Interstitial cystitis    Lactose intolerance    Migraine    with aura   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   PONV (postoperative nausea and vomiting)    Recurrent upper respiratory infection (URI)    Sebaceous cyst of breast, right lower inner quadrant 10/25/2012   Excised 11/21/12    Sleep apnea    Syncope, non cardiac    Past Surgical History:  Procedure Laterality Date   ARTHROSCOPIC HAGLUNDS REPAIR     BREAST CYST EXCISION Right 11/21/2012   Procedure: CYST EXCISION BREAST;  Surgeon: Darcella Earnest, MD;  Location: [facility];  Service: General;  Laterality: Right;   BREAST CYST EXCISION Bilateral 01/18/2022   Procedure: EXCISION OF SEBACEOUS CYST BILATERAL BREAST;  Surgeon: Sim Dryer, MD;  Location: [facility];  Service: General;  Laterality: Bilateral;   BREAST EXCISIONAL BIOPSY Right 11/2012   CESAREAN SECTION  04/19/1993    CHOLECYSTECTOMY     COLONOSCOPY  05/02/2011   Procedure: COLONOSCOPY;  Surgeon: Venson Ginger., MD;  Location: [facility];  Service: Endoscopy;  Laterality: N/A;   colonoscopy  11/04/2014   DENTAL SURGERY  03/06/2018   2 surgeries ( 03/06/2018 and 04/06/2018)   to remove two separate benign tumors   ESOPHAGEAL MANOMETRY N/A 11/04/2015   Procedure: ESOPHAGEAL MANOMETRY (EM);  Surgeon: Jolinda Necessary, MD;  Location: [facility];  Service: Endoscopy;  Laterality: N/A;   ESOPHAGOGASTRODUODENOSCOPY  05/02/2011   Procedure: ESOPHAGOGASTRODUODENOSCOPY (EGD);  Surgeon: Venson Ginger., MD;  Location: [facility];  Service: Endoscopy;  Laterality: N/A;   ESOPHAGOGASTRODUODENOSCOPY N/A 09/01/2014   Procedure: ESOPHAGOGASTRODUODENOSCOPY (EGD);  Surgeon: Jolinda Necessary, MD;  Location: [facility];  Service: Endoscopy;  Laterality: N/A;   KNEE SURGERY     LAPAROSCOPY     x 4   LESION REMOVAL N/A 01/18/2022   Procedure: EXCISION OF SEBACEOUS CYSTS CHEST AND NECK;  Surgeon: Sim Dryer, MD;  Location: [facility];  Service: General;  Laterality: N/A;   PH IMPEDANCE STUDY N/A 11/04/2015   Procedure: PH IMPEDANCE STUDY;  Surgeon: Jolinda Necessary, MD;  Location: [facility];  Service: Endoscopy;  Laterality: N/A;   VAGINAL HYSTERECTOMY  2010   TVH--ovaries remain   Patient Active Problem List   Diagnosis Date Noted   Sjogren syndrome with keratoconjunctivitis (HCC) 12/09/2022   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   Elevated LFTs 04/04/2021   Colitis 04/04/2021   Bacterial overgrowth syndrome 05/21/2020   Obesity 05/21/2020   History of endometriosis 05/21/2020   Constipation due to outlet dysfunction 02/05/2020   Gastroesophageal reflux disease 02/05/2020   Obstructive sleep apnea treated with continuous positive airway pressure (CPAP) 06/16/2017   Perennial allergic rhinitis with a nonallergic component 01/31/2017   Asthmatic bronchitis 01/31/2017   GI symptoms 01/31/2017    Family history of colon cancer 01/27/2015   Chest pain 12/13/2012   Dyspnea 12/13/2012   DVT of lower extremity (deep venous thrombosis) (HCC) 01/03/1990    PCP: Arva Lathe, MD  REFERRING PROVIDER: Lillian Rein, MD   REFERRING DIAG: M62.89 (ICD-10-CM) - Pelvic floor dysfunction  THERAPY DIAG:  Pelvic pain  Other low back pain  Muscle weakness (generalized)  Other muscle spasm  Unspecified lack of coordination  Rationale for Evaluation and Treatment: Rehabilitation  ONSET DATE: chronic  SUBJECTIVE:                                                                                                                                                                                           SUBJECTIVE STATEMENT:  Colonoscopy/endoscopy schedule for 05/25/23.  Pt states that she was having a lot of low back pain this morning, but after several bowel movements today she is feeling a little better. She is following up at [facility] tomorrow. She has bene walking every day and doing more stretches.    PAIN: 05/04/23 Are you having pain? Yes NPRS scale: 3/10 Pain location:  Lower abdominal pain, Lt sided pain, low back pain  Pain type: turning, knotted, pressure Pain description: intermittent and constant   Aggravating factors: constipation or frequent bowel movements/constantly having bowel movements  Relieving factors: exercises (bowel massage, happy baby, spinal twists, walking)  PRECAUTIONS: None  WEIGHT BEARING RESTRICTIONS: No  FALLS:  Has patient fallen in last 6 months? No  LIVING ENVIRONMENT: Lives with: lives with their spouse Lives in: House/apartment  OCCUPATION: nurse, in admin  PLOF: Independent  PATIENT GOALS: decrease pain and have more normal bowel movements  PERTINENT HISTORY:  Vaginal hysterectomy, DVT LE, endometriosis, interstitial cystitis, exploratory laps for endometriosis, c-section, cholecystectomy Sexual abuse: No  BOWEL  MOVEMENT: Pain with bowel movement: Yes Type of bowel movement:Type (Bristol Stool Scale) 1-7 (sometimes full range in the same bowel movement), Frequency sometimes many times a day, sometimes up to 4 days in between, and Strain Yes Fully  empty rectum: No Leakage: No Pads: No Fiber supplement: No - has attempted metamucil and it made constipation worse  URINATION: Pain with urination: Yes Fully empty bladder: No Stream:  varies  Urgency: Yes: all the time Frequency: multiple times an hours  Leakage: Urge to void, Walking to the bathroom, Coughing, Sneezing, Laughing, and Exercise Pads: Yes: daily  INTERCOURSE: Pain with intercourse: Initial Penetration and During Penetration Ability to have vaginal penetration:  Yes: with pain Climax: non painful WNL   PREGNANCY: Vaginal deliveries 1 Tearing Yes: 3rd degree tear C-section deliveries 1 Currently pregnant No  PROLAPSE: Periodically will feel vaginal bulging, heaviness in lower abdomen   OBJECTIVE:  05/04/23:               Internal Pelvic Floor: mild tenderness in bil levator ani, but no increase in tension; with internal rectal exam, she was demonstrating paradoxical contraction when trying to eccentrically lengthen with exhale and had very hard time correcting  Patient confirms identification and approves PT to assess internal pelvic floor and treatment Yes  PELVIC MMT:   MMT eval  Vaginal 2/5, 3 second hold, 3 repeat contractions  Internal Anal Sphincter 1/5  External Anal Sphincter 2/5  Puborectalis 1/5  Diastasis Recti   (Blank rows = not tested)        TONE: WNL   PROLAPSE: Grade 2 anterior vaginal wall laxity  04/25/23: Curl-up test: abdominal distortion Transversus abdominus: week, difficulty getting active contraction (not been working on strengthening)    LUMBARAROM/PROM:  A/PROM A/PROM  Eval (% available) 11/09/22 (% available) 04/25/23 (% available)  Flexion 50 90 90  Extension 25, pain anteriorly   25, pain in low back 50  Right lateral flexion 50, pain anteriorly  75, pain on right 50, pain on Rt  Left lateral flexion 50, pain anteriorly  75, pain on right 75   (Blank rows = not tested)  11/09/22: No internal rectal or vaginal pelvic floor exam performed due to high level of skin irritation   Weak transversus abdominus contraction  Abdominal restriction and tenderness in bil lower quadrant  Unable to perform pelvic tilt with appropriate coordination  LUMBARAROM/PROM:  A/PROM A/PROM  Eval (% available) 11/09/22 (% available)  Flexion 50 90  Extension 25, pain anteriorly  25, pain in low back  Right lateral flexion 50, pain anteriorly  75, pain on right  Left lateral flexion 50, pain anteriorly  75, pain on right   (Blank rows = not tested) 07/21/22: standing prolapse assessment demonstrated grade 2 anterior vaginal wall laxity   06/08/22:               External Perineal Exam: WNL                              Internal Pelvic Floor: burning reported with palpation of superficial muscles; deep aching/pressure with palpation of Lt levator ani   Patient confirms identification and approves PT to assess internal pelvic floor and treatment Yes  PELVIC MMT:   MMT eval  Vaginal 3/5  Internal Anal Sphincter 1/5  External Anal Sphincter 2/5  Puborectalis 1/5  Diastasis Recti   (Blank rows = not tested)        TONE: High in Lt levator ani   PROLAPSE: Not able to tell this session due to dyssynergic pelvic floor contraction     06/01/22: COGNITION: Overall cognitive status: Within functional limits for tasks assessed  SENSATION: Light touch: Appears intact Proprioception: Appears intact  GAIT: Comments: decreased hip extension, forward flexed trunk  POSTURE: rounded shoulders, forward head, decreased lumbar lordosis, increased thoracic kyphosis, posterior pelvic tilt, and flexed trunk    LUMBARAROM/PROM:  A/PROM A/PROM  Eval (% available)  Flexion 50   Extension 25, pain anteriorly   Right lateral flexion 50, pain anteriorly   Left lateral flexion 50, pain anteriorly    (Blank rows = not tested)   PALPATION:   General  Significant abdominal restriction and tenderness; decreased rib mobility with mobilization and breathing    TODAY'S TREATMENT:                                                                                                                              DATE:  05/04/23 Manual: Pt provides verbal consent for internal vaginal/rectal pelvic floor exam. Internal vaginal and rectal pelvic floor muscle reassessment Neuromuscular re-education: Pt provides verbal consent for internal vaginal/rectal pelvic floor exam. Internal vagina pelvic floor muscle contraction training Quick flicks Long holds Urge drill Internal rectal relaxation/bulge training Eccentric bulge with exhale in Lt side lying Therapeutic activities: Inverted lying position review   04/25/23 Manual: ILU massage in supine Bowel mobilization in supine Neuromuscular re-education: Seated hip adduction ball press with transversus abdominus and pelvic floor muscle 2 x 10 Seated hip abduction red band with transversus abdominus and pelvic floor muscle 2 x 10 Seated resisted march red band with transversus abdominus and pelvic floor muscle 2 x 10  Therapeutic activities: Reviewed importance of strengthening program as part of HEP Discussed performing internal pelvic floor muscle exam to work on paradoxical pelvic floor muscle contraction now that vaginal/perineal irritation has improved to help with constipation   04/18/23 Manual: Prone PA mobilizations from T10-L5 and sacral base grade 2-4 Myofascial release to thoracolumbar fascia Abdominal mobilization/bowel massage Instrument assisted soft tissue mobilizatoin to bil lumbar paraspinals Exercises: Prone hip flexor stretches with manual overpressure  Supine hamstring stretches with manual  overpressure Supine piriformis stretches with manual overpressure    PATIENT EDUCATION:  Education details: See above Person educated: Patient Education method: Programmer, multimedia, Demonstration, Actor cues, Verbal cues, and Handouts Education comprehension: verbalized understanding  HOME EXERCISE PROGRAM: [id number]  ASSESSMENT:  CLINICAL IMPRESSION: Internal vaginal and rectal exam performed today with notable findings of pelvic floor muscle and external anal sphincter weakness, grade 2 anterior vaginal wall laxity (at rest - could not coordinate bearing down), and inability to eccentrically lengthen external anal sphincter and pelvic floor muscles as she was pushing finger out with paradoxical contraction. Constipation is likely contributing to anterior vaginal wall laxity, and both are likely contributing to increased low back pain. She was instructed in pelvic floor muscle strengthening program. We also spent time working on better coordination of eccentric lengthening of posterior pelvic floor muscles and external anal sphincter with exhale to attempt more complete emptying with bowel movements. She was educated that when she tightens during pushing  she is fighting complete evacuation. Believe if we can correct this motor pattern she will see a lot of progress with complete emptying and reduction in bowel movement frequency. She will continue to benefit from skilled PT intervention in order to decrease pain, improve bowel movements, and decrease urinary urgency/incontinence.     OBJECTIVE IMPAIRMENTS: decreased activity tolerance, decreased coordination, decreased endurance, decreased mobility, decreased strength, increased fascial restrictions, increased muscle spasms, impaired tone, postural dysfunction, and pain.   ACTIVITY LIMITATIONS: lifting, bending, continence, and locomotion level  PARTICIPATION LIMITATIONS: interpersonal relationship, community activity, and occupation  PERSONAL  FACTORS: 1 comorbidity: medical history  are also affecting patient's functional outcome.   REHAB POTENTIAL: Good  CLINICAL DECISION MAKING: Stable/uncomplicated  EVALUATION COMPLEXITY: Low   GOALS: Goals reviewed with patient? Yes  SHORT TERM GOALS: Updated 04/25/23  Pt will be independent with HEP.   Baseline: Goal status: MET 07/12/22  2.  Pt will be independent with diaphragmatic breathing and down training activities in order to improve pelvic floor relaxation.  Baseline:  Goal status: MET 07/12/22  3.  Pt will be independent with the knack, urge suppression technique, and double voiding in order to improve bladder habits and decrease urinary incontinence.   Baseline:  Goal status: MET 09/22/22  4.  Pt will be independent with use of squatty potty, relaxed toileting mechanics, and improved bowel movement techniques in order to increase ease of bowel movements and complete evacuation.   Baseline:  Goal status: MET 11/09/22  5.  Pt will be able to correctly perform diaphragmatic breathing and appropriate pressure management in order to prevent worsening vaginal wall laxity and improve pelvic floor A/ROM.   Baseline:  Goal status: MET 01/18/23  LONG TERM GOALS: Updated 04/25/23  Pt will be independent with advanced HEP.   Baseline:  Goal status: IN PROGRESS 04/25/23  2.  Pt will demonstrate normal pelvic floor muscle tone and A/ROM, able to achieve 4/5 strength with contractions and 10 sec endurance, in order to provide appropriate lumbopelvic support in functional activities.   Baseline: not assessed today per patient request  Goal status: IN PROGRESS 04/25/23  3.  Pt will increase all impaired lumbar A/ROM by 25% without pain.  Baseline: Pt seen a little bit of loss in some and improvement in extension Goal status:  IN PROGRESS 04/25/23  4.  Pt will report pain no higher than 3/10 with any activity. Baseline: pain has gotten up to 5/10, but currently at a 3/10 Goal  status:  IN PROGRESS 04/25/23  5.  Pt will report 0/10 pain with vaginal penetration in order to improve intimate relationship with partner.    Baseline: no recent intercourse attempts  Goal status:  IN PROGRESS 04/25/23  6.  Pt will be able to go 2-3 hours in between voids without urgency or incontinence in order to improve QOL and perform all functional activities with less difficulty.   Baseline:  Goal status: MET 02/21/23  7.  Pt will report no leaks with laughing, coughing, sneezing in order to improve comfort with interpersonal relationships and community activities.   Baseline: Pt states that she feels 30-40% better Goal status:  IN PROGRESS 04/25/23  8.  Pt will have consistent bowel movements 4-5x/week without straining or pain.  Baseline:  Goal status:  MET 02/21/23  PLAN:  PT FREQUENCY: 1-2x/week  PT DURATION: 6 months  PLANNED INTERVENTIONS: Therapeutic exercises, Therapeutic activity, Neuromuscular re-education, Balance training, Gait training, Patient/Family education, Self Care, Joint mobilization, Dry Needling, Biofeedback,  and Manual therapy  PLAN FOR NEXT SESSION: strengthening program - reinforce importance and go over some exercises each session; internal pelvic floor muscle exam  Verlena Glenn, PT, DPT05/01/255:08 PM

## 2023-05-05 ENCOUNTER — Other Ambulatory Visit (HOSPITAL_COMMUNITY): Payer: Self-pay

## 2023-05-05 DIAGNOSIS — L900 Lichen sclerosus et atrophicus: Secondary | ICD-10-CM | POA: Diagnosis not present

## 2023-05-05 DIAGNOSIS — L280 Lichen simplex chronicus: Secondary | ICD-10-CM | POA: Diagnosis not present

## 2023-05-05 MED ORDER — TRIAMCINOLONE ACETONIDE 0.1 % EX OINT
TOPICAL_OINTMENT | CUTANEOUS | 5 refills | Status: DC
  Filled 2023-05-05: qty 30, 15d supply, fill #0

## 2023-05-08 ENCOUNTER — Other Ambulatory Visit (HOSPITAL_COMMUNITY): Payer: Self-pay

## 2023-05-08 ENCOUNTER — Encounter: Payer: Self-pay | Admitting: Hematology & Oncology

## 2023-05-08 ENCOUNTER — Ambulatory Visit: Payer: Self-pay

## 2023-05-08 DIAGNOSIS — M62838 Other muscle spasm: Secondary | ICD-10-CM

## 2023-05-08 DIAGNOSIS — R279 Unspecified lack of coordination: Secondary | ICD-10-CM | POA: Diagnosis not present

## 2023-05-08 DIAGNOSIS — M6281 Muscle weakness (generalized): Secondary | ICD-10-CM

## 2023-05-08 DIAGNOSIS — M5459 Other low back pain: Secondary | ICD-10-CM | POA: Diagnosis not present

## 2023-05-08 DIAGNOSIS — R102 Pelvic and perineal pain: Secondary | ICD-10-CM

## 2023-05-08 NOTE — Therapy (Signed)
 OUTPATIENT PHYSICAL THERAPY FEMALE PELVIC TREATMENT   Patient Name: Karen [name] MRN: [id number] DOB:Sep 06, 1970, 53 y.o., female Today's Date: 05/08/2023  END OF SESSION:  PT End of Session - 05/08/23 0940     Visit Number 40    Date for PT Re-Evaluation 10/10/23    Authorization Type [employer]    PT Start Time [phone]    PT Stop Time 1015    PT Time Calculation (min) 36 min    Activity Tolerance Patient tolerated treatment well    Behavior During Therapy [facility] for tasks assessed/performed                      Past Medical History:  Diagnosis Date   Adenomyosis    Anemia    Arthritis 2013   L knee gets steroid injections    Asthmatic bronchitis 01/31/2017   Back pain    Chronic constipation    Diarrhea    DVT of lower extremity (deep venous thrombosis) (HCC)    Dysmenorrhea    Endometriosis    Esophageal stricture    G6PD deficiency    GERD (gastroesophageal reflux disease)    History of kidney stones    History of uterine fibroid    IBS (irritable bowel syndrome)    Interstitial cystitis    Lactose intolerance    Migraine    with aura   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   PONV (postoperative nausea and vomiting)    Recurrent upper respiratory infection (URI)    Sebaceous cyst of breast, right lower inner quadrant 10/25/2012   Excised 11/21/12    Sleep apnea    Syncope, non cardiac    Past Surgical History:  Procedure Laterality Date   ARTHROSCOPIC HAGLUNDS REPAIR     BREAST CYST EXCISION Right 11/21/2012   Procedure: CYST EXCISION BREAST;  Surgeon: Darcella Earnest, MD;  Location: [facility];  Service: General;  Laterality: Right;   BREAST CYST EXCISION Bilateral 01/18/2022   Procedure: EXCISION OF SEBACEOUS CYST BILATERAL BREAST;  Surgeon: Sim Dryer, MD;  Location: [facility];  Service: General;  Laterality: Bilateral;   BREAST EXCISIONAL BIOPSY Right 11/2012   CESAREAN SECTION  04/19/1993    CHOLECYSTECTOMY     COLONOSCOPY  05/02/2011   Procedure: COLONOSCOPY;  Surgeon: Venson Ginger., MD;  Location: [facility];  Service: Endoscopy;  Laterality: N/A;   colonoscopy  11/04/2014   DENTAL SURGERY  03/06/2018   2 surgeries ( 03/06/2018 and 04/06/2018)   to remove two separate benign tumors   ESOPHAGEAL MANOMETRY N/A 11/04/2015   Procedure: ESOPHAGEAL MANOMETRY (EM);  Surgeon: Jolinda Necessary, MD;  Location: [facility];  Service: Endoscopy;  Laterality: N/A;   ESOPHAGOGASTRODUODENOSCOPY  05/02/2011   Procedure: ESOPHAGOGASTRODUODENOSCOPY (EGD);  Surgeon: Venson Ginger., MD;  Location: [facility];  Service: Endoscopy;  Laterality: N/A;   ESOPHAGOGASTRODUODENOSCOPY N/A 09/01/2014   Procedure: ESOPHAGOGASTRODUODENOSCOPY (EGD);  Surgeon: Jolinda Necessary, MD;  Location: [facility];  Service: Endoscopy;  Laterality: N/A;   KNEE SURGERY     LAPAROSCOPY     x 4   LESION REMOVAL N/A 01/18/2022   Procedure: EXCISION OF SEBACEOUS CYSTS CHEST AND NECK;  Surgeon: Sim Dryer, MD;  Location: [facility];  Service: General;  Laterality: N/A;   PH IMPEDANCE STUDY N/A 11/04/2015   Procedure: PH IMPEDANCE STUDY;  Surgeon: Jolinda Necessary, MD;  Location: [facility];  Service: Endoscopy;  Laterality: N/A;   VAGINAL HYSTERECTOMY  2010   TVH--ovaries remain   Patient Active Problem List   Diagnosis Date Noted   Sjogren syndrome with keratoconjunctivitis (HCC) 12/09/2022   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   Elevated LFTs 04/04/2021   Colitis 04/04/2021   Bacterial overgrowth syndrome 05/21/2020   Obesity 05/21/2020   History of endometriosis 05/21/2020   Constipation due to outlet dysfunction 02/05/2020   Gastroesophageal reflux disease 02/05/2020   Obstructive sleep apnea treated with continuous positive airway pressure (CPAP) 06/16/2017   Perennial allergic rhinitis with a nonallergic component 01/31/2017   Asthmatic bronchitis 01/31/2017   GI symptoms 01/31/2017    Family history of colon cancer 01/27/2015   Chest pain 12/13/2012   Dyspnea 12/13/2012   DVT of lower extremity (deep venous thrombosis) (HCC) 01/03/1990    PCP: Arva Lathe, MD  REFERRING PROVIDER: Lillian Rein, MD   REFERRING DIAG: M62.89 (ICD-10-CM) - Pelvic floor dysfunction  THERAPY DIAG:  Pelvic pain  Other low back pain  Muscle weakness (generalized)  Other muscle spasm  Unspecified lack of coordination  Rationale for Evaluation and Treatment: Rehabilitation  ONSET DATE: chronic  SUBJECTIVE:                                                                                                                                                                                           SUBJECTIVE STATEMENT:  Colonoscopy/endoscopy schedule for 05/25/23.  Pt is working on being more intentional when going to the bathroom. She states that she had a very productive bowel movement after her last visit. She went to see specialist at [facility] for vulva last week and states that she is supposed to keep same skin routine with steroid cream 3x/week.    PAIN: 05/08/23 Are you having pain? Yes NPRS scale: 3/10 Pain location:  Lower abdominal pain, Lt sided pain, low back pain  Pain type: turning, knotted, pressure Pain description: intermittent and constant   Aggravating factors: constipation or frequent bowel movements/constantly having bowel movements  Relieving factors: exercises (bowel massage, happy baby, spinal twists, walking)  PRECAUTIONS: None  WEIGHT BEARING RESTRICTIONS: No  FALLS:  Has patient fallen in last 6 months? No  LIVING ENVIRONMENT: Lives with: lives with their spouse Lives in: House/apartment  OCCUPATION: nurse, in admin  PLOF: Independent  PATIENT GOALS: decrease pain and have more normal bowel movements  PERTINENT HISTORY:  Vaginal hysterectomy, DVT LE, endometriosis, interstitial cystitis, exploratory laps for endometriosis, c-section,  cholecystectomy Sexual abuse: No  BOWEL MOVEMENT: Pain with bowel movement: Yes Type of bowel movement:Type (Bristol Stool Scale) 1-7 (sometimes full range in the same bowel movement), Frequency sometimes many times a day, sometimes up to  4 days in between, and Strain Yes Fully empty rectum: No Leakage: No Pads: No Fiber supplement: No - has attempted metamucil and it made constipation worse  URINATION: Pain with urination: Yes Fully empty bladder: No Stream:  varies  Urgency: Yes: all the time Frequency: multiple times an hours  Leakage: Urge to void, Walking to the bathroom, Coughing, Sneezing, Laughing, and Exercise Pads: Yes: daily  INTERCOURSE: Pain with intercourse: Initial Penetration and During Penetration Ability to have vaginal penetration:  Yes: with pain Climax: non painful WNL   PREGNANCY: Vaginal deliveries 1 Tearing Yes: 3rd degree tear C-section deliveries 1 Currently pregnant No  PROLAPSE: Periodically will feel vaginal bulging, heaviness in lower abdomen   OBJECTIVE:  05/04/23:               Internal Pelvic Floor: mild tenderness in bil levator ani, but no increase in tension; with internal rectal exam, she was demonstrating paradoxical contraction when trying to eccentrically lengthen with exhale and had very hard time correcting  Patient confirms identification and approves PT to assess internal pelvic floor and treatment Yes  PELVIC MMT:   MMT eval  Vaginal 2/5, 3 second hold, 3 repeat contractions  Internal Anal Sphincter 1/5  External Anal Sphincter 2/5  Puborectalis 1/5  Diastasis Recti   (Blank rows = not tested)        TONE: WNL   PROLAPSE: Grade 2 anterior vaginal wall laxity  04/25/23: Curl-up test: abdominal distortion Transversus abdominus: week, difficulty getting active contraction (not been working on strengthening)    LUMBARAROM/PROM:  A/PROM A/PROM  Eval (% available) 11/09/22 (% available) 04/25/23 (% available)   Flexion 50 90 90  Extension 25, pain anteriorly  25, pain in low back 50  Right lateral flexion 50, pain anteriorly  75, pain on right 50, pain on Rt  Left lateral flexion 50, pain anteriorly  75, pain on right 75   (Blank rows = not tested)  11/09/22: No internal rectal or vaginal pelvic floor exam performed due to high level of skin irritation   Weak transversus abdominus contraction  Abdominal restriction and tenderness in bil lower quadrant  Unable to perform pelvic tilt with appropriate coordination  LUMBARAROM/PROM:  A/PROM A/PROM  Eval (% available) 11/09/22 (% available)  Flexion 50 90  Extension 25, pain anteriorly  25, pain in low back  Right lateral flexion 50, pain anteriorly  75, pain on right  Left lateral flexion 50, pain anteriorly  75, pain on right   (Blank rows = not tested) 07/21/22: standing prolapse assessment demonstrated grade 2 anterior vaginal wall laxity   06/08/22:               External Perineal Exam: WNL                              Internal Pelvic Floor: burning reported with palpation of superficial muscles; deep aching/pressure with palpation of Lt levator ani   Patient confirms identification and approves PT to assess internal pelvic floor and treatment Yes  PELVIC MMT:   MMT eval  Vaginal 3/5  Internal Anal Sphincter 1/5  External Anal Sphincter 2/5  Puborectalis 1/5  Diastasis Recti   (Blank rows = not tested)        TONE: High in Lt levator ani   PROLAPSE: Not able to tell this session due to dyssynergic pelvic floor contraction     06/01/22: COGNITION: Overall cognitive status:  Within functional limits for tasks assessed     SENSATION: Light touch: Appears intact Proprioception: Appears intact  GAIT: Comments: decreased hip extension, forward flexed trunk  POSTURE: rounded shoulders, forward head, decreased lumbar lordosis, increased thoracic kyphosis, posterior pelvic tilt, and flexed trunk     LUMBARAROM/PROM:  A/PROM A/PROM  Eval (% available)  Flexion 50  Extension 25, pain anteriorly   Right lateral flexion 50, pain anteriorly   Left lateral flexion 50, pain anteriorly    (Blank rows = not tested)   PALPATION:   General  Significant abdominal restriction and tenderness; decreased rib mobility with mobilization and breathing    TODAY'S TREATMENT:                                                                                                                              DATE:  05/08/23 Manual: ILU bowel mobilization  Abdomina lscar tissue mobilization Neuromuscular re-education: Diaphragmatic breathing Supine with 10lb wt: 3 seconds x 12, 4 seconds x 12 Significant cues for improved pacing of breathing and motor control Sitting on towel roll for feedback with contract-relax-bulge with breathing and eccentrically lengthening Supine manual biofeedback externally on pelvis for improved pelvic floor muscle eccentric lengthening using contract-relax-bulge technique   05/04/23 Manual: Pt provides verbal consent for internal vaginal/rectal pelvic floor exam. Internal vaginal and rectal pelvic floor muscle reassessment Neuromuscular re-education: Pt provides verbal consent for internal vaginal/rectal pelvic floor exam. Internal vagina pelvic floor muscle contraction training Quick flicks Long holds Urge drill Internal rectal relaxation/bulge training Eccentric bulge with exhale in Lt side lying Therapeutic activities: Inverted lying position review   04/25/23 Manual: ILU massage in supine Bowel mobilization in supine Neuromuscular re-education: Seated hip adduction ball press with transversus abdominus and pelvic floor muscle 2 x 10 Seated hip abduction red band with transversus abdominus and pelvic floor muscle 2 x 10 Seated resisted march red band with transversus abdominus and pelvic floor muscle 2 x 10  Therapeutic activities: Reviewed importance of  strengthening program as part of HEP Discussed performing internal pelvic floor muscle exam to work on paradoxical pelvic floor muscle contraction now that vaginal/perineal irritation has improved to help with constipation   PATIENT EDUCATION:  Education details: See above Person educated: Patient Education method: Programmer, multimedia, Facilities manager, Actor cues, Verbal cues, and Handouts Education comprehension: verbalized understanding  HOME EXERCISE PROGRAM: [id number]  ASSESSMENT:  CLINICAL IMPRESSION: Pt had significant difficulty with diaphragmatic breathing techniques today; she required heavy cuing to help slow down breathing pace and get better abdominal expansion. She was able to make some progress with this motor control with weight and verbal cues for improved feedback. We practiced with external cuing for improved eccentric contraction of pelvic floor muscles in supine and sitting on towel roll. She is still having difficulty with this, but some great improvement today including increased proprioception of when she was performing correctly. She will continue to benefit from skilled PT intervention in order to decrease  pain, improve bowel movements, and decrease urinary urgency/incontinence.     OBJECTIVE IMPAIRMENTS: decreased activity tolerance, decreased coordination, decreased endurance, decreased mobility, decreased strength, increased fascial restrictions, increased muscle spasms, impaired tone, postural dysfunction, and pain.   ACTIVITY LIMITATIONS: lifting, bending, continence, and locomotion level  PARTICIPATION LIMITATIONS: interpersonal relationship, community activity, and occupation  PERSONAL FACTORS: 1 comorbidity: medical history  are also affecting patient's functional outcome.   REHAB POTENTIAL: Good  CLINICAL DECISION MAKING: Stable/uncomplicated  EVALUATION COMPLEXITY: Low   GOALS: Goals reviewed with patient? Yes  SHORT TERM GOALS: Updated 04/25/23  Pt will  be independent with HEP.   Baseline: Goal status: MET 07/12/22  2.  Pt will be independent with diaphragmatic breathing and down training activities in order to improve pelvic floor relaxation.  Baseline:  Goal status: MET 07/12/22  3.  Pt will be independent with the knack, urge suppression technique, and double voiding in order to improve bladder habits and decrease urinary incontinence.   Baseline:  Goal status: MET 09/22/22  4.  Pt will be independent with use of squatty potty, relaxed toileting mechanics, and improved bowel movement techniques in order to increase ease of bowel movements and complete evacuation.   Baseline:  Goal status: MET 11/09/22  5.  Pt will be able to correctly perform diaphragmatic breathing and appropriate pressure management in order to prevent worsening vaginal wall laxity and improve pelvic floor A/ROM.   Baseline:  Goal status: MET 01/18/23  LONG TERM GOALS: Updated 04/25/23  Pt will be independent with advanced HEP.   Baseline:  Goal status: IN PROGRESS 04/25/23  2.  Pt will demonstrate normal pelvic floor muscle tone and A/ROM, able to achieve 4/5 strength with contractions and 10 sec endurance, in order to provide appropriate lumbopelvic support in functional activities.   Baseline: not assessed today per patient request  Goal status: IN PROGRESS 04/25/23  3.  Pt will increase all impaired lumbar A/ROM by 25% without pain.  Baseline: Pt seen a little bit of loss in some and improvement in extension Goal status:  IN PROGRESS 04/25/23  4.  Pt will report pain no higher than 3/10 with any activity. Baseline: pain has gotten up to 5/10, but currently at a 3/10 Goal status:  IN PROGRESS 04/25/23  5.  Pt will report 0/10 pain with vaginal penetration in order to improve intimate relationship with partner.    Baseline: no recent intercourse attempts  Goal status:  IN PROGRESS 04/25/23  6.  Pt will be able to go 2-3 hours in between voids without  urgency or incontinence in order to improve QOL and perform all functional activities with less difficulty.   Baseline:  Goal status: MET 02/21/23  7.  Pt will report no leaks with laughing, coughing, sneezing in order to improve comfort with interpersonal relationships and community activities.   Baseline: Pt states that she feels 30-40% better Goal status:  IN PROGRESS 04/25/23  8.  Pt will have consistent bowel movements 4-5x/week without straining or pain.  Baseline:  Goal status:  MET 02/21/23  PLAN:  PT FREQUENCY: 1-2x/week  PT DURATION: 6 months  PLANNED INTERVENTIONS: Therapeutic exercises, Therapeutic activity, Neuromuscular re-education, Balance training, Gait training, Patient/Family education, Self Care, Joint mobilization, Dry Needling, Biofeedback, and Manual therapy  PLAN FOR NEXT SESSION: strengthening program - reinforce importance and go over some exercises each session; internal pelvic floor muscle exam  Verlena Glenn, PT, DPT05/05/259:42 AM

## 2023-05-09 ENCOUNTER — Other Ambulatory Visit (HOSPITAL_COMMUNITY): Payer: Self-pay

## 2023-05-10 ENCOUNTER — Other Ambulatory Visit (HOSPITAL_COMMUNITY): Payer: Self-pay

## 2023-05-11 ENCOUNTER — Ambulatory Visit: Payer: Commercial Managed Care - PPO | Admitting: Neurology

## 2023-05-11 ENCOUNTER — Encounter: Payer: Self-pay | Admitting: Hematology & Oncology

## 2023-05-11 ENCOUNTER — Encounter: Payer: Self-pay | Admitting: Neurology

## 2023-05-11 VITALS — BP 112/80 | HR 77 | Ht 67.0 in | Wt 207.8 lb

## 2023-05-11 DIAGNOSIS — G43909 Migraine, unspecified, not intractable, without status migrainosus: Secondary | ICD-10-CM | POA: Diagnosis not present

## 2023-05-11 DIAGNOSIS — R29818 Other symptoms and signs involving the nervous system: Secondary | ICD-10-CM | POA: Diagnosis not present

## 2023-05-11 DIAGNOSIS — M25531 Pain in right wrist: Secondary | ICD-10-CM | POA: Diagnosis not present

## 2023-05-11 NOTE — Patient Instructions (Signed)
 Your exam looks stable. Your Carotid Doppler study from November 2024, which is the ultrasound of the main neck arteries, was negative for any significant stenoses, meaning tightness/hardening of the arteries or blockages.

## 2023-05-11 NOTE — Progress Notes (Signed)
 Subjective:    Patient ID: Karen [name] is a 53 y.o. female.  HPI    Interim history:   Karen [name] is a 53 year old female with an underlying medical history of anemia, asthma, DVT, endometriosis, reflux disease, kidney stones, irritable bowel syndrome, interstitial cystitis, migraine headaches, arthritis, back pain, history of syncope, mild obstructive sleep apnea (not on PAP therapy) and mild obesity, who presents for follow-up consultation of her paresthesias, migraine headaches and mild sleep apnea.  The patient is unaccompanied today.  I last saw her in November 2024 shortly after her evaluation for TIA.  We had talked about her workup and I suggested we proceed with a home sleep test for reevaluation of her OSA and with a carotid ultrasound.  I suggested as needed use of Maxalt  for intermittent migraine headaches.   Today, 05/11/2023: She reports feeling stable, no serious migraine-like headaches, has not actually tried the Maxalt  but filled the prescription.  She has some wrist pain on the side radiating up to the forearm.  She has some swelling and knots.  She has some swelling of the left ankle, she is followed by rheumatology, she is on Plaquenil .  She is scheduled by GI for an upper and lower endoscopy later this month for GI issues.  Her allergy specialist has done some genetic testing with her as well.  She is not on blood thinner, followed by hematology for her history of DVT.  C. Doppler on 11/17/22 was benign. HST on 12/03/22 showed mild obstructive sleep apnea with a total AHI of 9.2/hour and O2 nadir of 80.2%. Snoring was detected, and appeared to be intermittent in the mild to moderate range.  She was offered home autoPAP therapy but declined, wanted to continue to work on weight loss.    Previously:   11/10/2022: (She) reports a recent episode of sudden onset of right lower face pain and neck pain, tingling, and developing a headache afterwards.  The headache was on the right  side, not as severe as she recalls her previous migraines though.  She has a remote history of migraines in her 96s and they were rather sporadic.  She did not have any prescription medications in the past for her migraines and they improved after she had a hysterectomy, they were related to her endometriosis as she recalls.  She does not have a history of sudden onset of one-sided weakness or numbness or tingling or droopy face or slurring of speech otherwise.  She has intermittent tingling in her left thigh area, this comes and goes and is not a new problem.  She has had soreness in her vulvar area and has seen GYN and even dermatology for this, she had a biopsy.  She was diagnosed with vulvodynia and was given a prescription for gabapentin  but has not tried it yet.  She has significant GI issues.  She continues to suffer from nausea and vomiting and severe constipation, she is supposed to start back on Motegrity .  She is taking MiraLAX  2-3 times a day.  She had to have esophageal dilatation in the past.  She does have quite a bit of stress.  She was also diagnosed with being heterozygous for hemochromatosis and has seen hematology.   She presented to the emergency room on 11/07/2022 with chest pain.  She reported right facial pain and numbness as well as a headache at the time.  I reviewed the emergency room records.  She was treated symptomatically for her headache, which was felt  to be a migraine, with diphenhydramine  and prochlorperazine .  Laboratory testing showed benign troponin levels, INR normal, PTT normal, CBC without differential unremarkable, CMP benign.  Ethanol level was negative.  She had a head CT without contrast on 11/08/2022 and I reviewed the results: Impression: No acute intracranial abnormality.  In addition, I personally and independently reviewed images through the PACS system.   She also had a CT angiogram of the chest, abdomen and pelvis.  I reviewed the results:   IMPRESSION: 1. No  evidence aortic aneurysm or dissection. 2. No acute process in the chest, abdomen, or pelvis. 3. Small hiatal hernia. 4. Moderate amount of retained stool in the colon, suggesting constipation.   She had a brain MRI without contrast on 11/08/2022 and I reviewed the results: Impression: Normal noncontrast MRI appearance of the brain.  In addition, I personally and independently reviewed images through the PACS system.  I had evaluated her years ago for sleep apnea concern.  She had a baseline sleep study through our office on 04/25/2017 which showed a total AHI of 6/h, REM AHI of 30.5/h, supine AHI of 8.2/h, O2 nadir 80%.  She could not tolerate AutoPap therapy.  She was encouraged to work on weight loss.  I made a referral to medical weight management clinic.  She was also encouraged to look into an oral appliance through dentistry and contact our office if she found a dentist that was in her insurance network.   Of note, she had an EMG/NCV test through [facility] neurology on 11/03/2021 and I reviewed the results: Impression: This is a normal study of the lower extremities.  In particular, there is no evidence of a large fiber sensorimotor polyneuropathy or lumbosacral radiculopathy.  Study was interpreted by Dr. Lydia Sams.  Indication at the time was bilateral leg pain and paresthesias, worse on the left.       08/10/2017 53 year old right-handed woman with an underlying medical history of mild intermittent asthma, irritable bowel syndrome, food intolerances, allergic rhinitis, interstitial cystitis, history of DVT in 1992, reflux disease, migraine headaches, history of vertigo, kidney stones, and borderline obesity, who presents for follow-up consultation of her obstructive sleep apnea on AutoPap therapy. The patient is unaccompanied today. I first met her on 03/23/2017 at the request of her primary care provider, at which time she reported snoring and witnessed apneas per husband's report as well as daytime  somnolence. She was advised to proceed with sleep study testing. She had a baseline sleep study on 04/25/2017. I went over her test results with her in detail today. Sleep latency was 2 minutes, REM latency 93 minutes, sleep efficiency 91.7%. She had an increased percentage of stage II sleep, slow-wave sleep was absent and REM sleep was reduced at 13.4%. Total AHI was in the mild range at 6 per hour, REM AHI severe at 30.5 per hour, supine AHI 8.2 per hour. Average oxygen saturation was 95%, nadir was 80% during supine REM sleep. Based on her test results and sleep related complaints I suggested a trial of AutoPap therapy.   I reviewed her AutoPap compliance data from 07/10/2017 through 08/08/2017 which is a total of 30 days, during which time she used her AutoPap 26 days with percent used days greater than 4 hours at 77%, indicating adequate compliance with an average usage of 4 hours and 47 minutes, residual AHI at goal at 2.1 per hour, 95th percentile pressure at 8.7 cm with a range of 5 cm to 9 cm with EPR. Leak  on the low side with the 95th percentile at 5.1 L/m. She reports still struggling with the AutoPap pressure and the mask. She does not feel comfortable. She has had no improvement in her reflux or GI symptoms. Furthermore, she feels like she has been struggling with respiratory symptoms. She has been treated for asthma, even needed steroids which in turn caused her to gain more weight. She really did try to be compliant with treatment but is not raeping any benefit from it. For consideration of an oral appliance she has seen Dr. Abel Abelson and also her own dentist but unfortunately, both dentists our out of network.    She saw Moses Arenas, nurse practitioner in the interim on 06/16/2017 at which time she was compliant with AutoPap therapy but was complaining of bloating. She was encouraged to continue to try using AutoPap if possible and monitor her Sx until her next appt.   03/23/2017: (She)  reports snoring and excessive daytime somnolence as well as apneic pauses while asleep per husband's report. I reviewed your office note from 03/10/2017, which you kindly included. Her Epworth sleepiness score is 11 out of 24 today, fatigue score is 38/63. She has woken up with a sense of choking. She has had recurrent respiratory infections. She works as a Financial risk analyst. She lives at home with her husband and 2 children. She is a nonsmoker and drinks alcohol rarely, maybe once a year or so. Caffeine several times a week but not necessarily daily. Had recent GI issues including irritable bowel syndrome, food intolerance, for the past year or 2. She has lost about 50 pounds. She also started having recurrent respiratory infections, allergy flareups. She has seen an allergy specialist for this. She was started on new medications. She has been doing physical therapy for pelvic floor exercises. Bedtime is around 11 PM and she does not have significant difficulty falling asleep. She feels exhausted. Rise time is around 5 AM. She has nocturia every night. She has woken up with a headache. She has pauses in her breathing. She denies telltale symptoms of restless leg syndrome.  Her Past Medical History Is Significant For: Past Medical History:  Diagnosis Date   Adenomyosis    Anemia    Arthritis 2013   L knee gets steroid injections    Asthmatic bronchitis 01/31/2017   Back pain    Chronic constipation    Diarrhea    DVT of lower extremity (deep venous thrombosis) (HCC)    Dysmenorrhea    Endometriosis    Esophageal stricture    G6PD deficiency    GERD (gastroesophageal reflux disease)    History of kidney stones    History of uterine fibroid    IBS (irritable bowel syndrome)    Interstitial cystitis    Lactose intolerance    Migraine    with aura   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   PONV (postoperative nausea and vomiting)    Recurrent upper respiratory infection (URI)    Sebaceous cyst of  breast, right lower inner quadrant 10/25/2012   Excised 11/21/12    Sleep apnea    Syncope, non cardiac     Her Past Surgical History Is Significant For: Past Surgical History:  Procedure Laterality Date   ARTHROSCOPIC HAGLUNDS REPAIR     BREAST CYST EXCISION Right 11/21/2012   Procedure: CYST EXCISION BREAST;  Surgeon: Darcella Earnest, MD;  Location: [facility];  Service: General;  Laterality: Right;   BREAST CYST EXCISION Bilateral 01/18/2022  Procedure: EXCISION OF SEBACEOUS CYST BILATERAL BREAST;  Surgeon: Sim Dryer, MD;  Location: [facility];  Service: General;  Laterality: Bilateral;   BREAST EXCISIONAL BIOPSY Right 11/2012   CESAREAN SECTION  04/19/1993   CHOLECYSTECTOMY     COLONOSCOPY  05/02/2011   Procedure: COLONOSCOPY;  Surgeon: Venson Ginger., MD;  Location: [facility];  Service: Endoscopy;  Laterality: N/A;   colonoscopy  11/04/2014   DENTAL SURGERY  03/06/2018   2 surgeries ( 03/06/2018 and 04/06/2018)   to remove two separate benign tumors   ESOPHAGEAL MANOMETRY N/A 11/04/2015   Procedure: ESOPHAGEAL MANOMETRY (EM);  Surgeon: Jolinda Necessary, MD;  Location: [facility];  Service: Endoscopy;  Laterality: N/A;   ESOPHAGOGASTRODUODENOSCOPY  05/02/2011   Procedure: ESOPHAGOGASTRODUODENOSCOPY (EGD);  Surgeon: Venson Ginger., MD;  Location: [facility];  Service: Endoscopy;  Laterality: N/A;   ESOPHAGOGASTRODUODENOSCOPY N/A 09/01/2014   Procedure: ESOPHAGOGASTRODUODENOSCOPY (EGD);  Surgeon: Jolinda Necessary, MD;  Location: [facility];  Service: Endoscopy;  Laterality: N/A;   KNEE SURGERY     LAPAROSCOPY     x 4   LESION REMOVAL N/A 01/18/2022   Procedure: EXCISION OF SEBACEOUS CYSTS CHEST AND NECK;  Surgeon: Sim Dryer, MD;  Location: [facility];  Service: General;  Laterality: N/A;   PH IMPEDANCE STUDY N/A 11/04/2015   Procedure: PH IMPEDANCE STUDY;  Surgeon: Jolinda Necessary, MD;  Location: [facility];   Service: Endoscopy;  Laterality: N/A;   VAGINAL HYSTERECTOMY  2010   TVH--ovaries remain    Her Family History Is Significant For: Family History  Problem Relation Age of Onset   Colon cancer Father 35   Hypertension Father    Stroke Father    Heart disease Father    Heart attack Father    Hyperlipidemia Father    Colon cancer Mother 34   Diabetes Maternal Uncle    Stroke Paternal Grandmother    Heart attack Paternal Grandmother    Heart attack Maternal Grandmother    Heart attack Maternal Grandfather    Cancer Maternal Uncle    Hypertension Sister    Diabetes Brother    Breast cancer Neg Hx     Her Social History Is Significant For: Social History   Socioeconomic History   Marital status: Married    Spouse name: Royston Cornea   Number of children: 2   Years of education: Not on file   Highest education level: Not on file  Occupational History   Occupation: Financial risk analyst  Tobacco Use   Smoking status: Never    Passive exposure: Never   Smokeless tobacco: Never  Vaping Use   Vaping status: Never Used  Substance and Sexual Activity   Alcohol use: Not Currently   Drug use: No   Sexual activity: Yes    Partners: Male    Birth control/protection: None, Surgical    Comment: TVH-ovaries remain  Other Topics Concern   Not on file  Social History Narrative   Are you right handed or left handed? Right Handed    Are you currently employed ? 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 No   Who lives with you? Husband and daughters   What type of home do you live in: 1 story or 2 story? Lives in a two story home    Caffiene 3 times week.  decaffienated mostly       Social Drivers  of Health   Financial Resource Strain: Not on file  Food Insecurity: No Food Insecurity (04/30/2020)   Hunger Vital Sign    Worried About Running Out of Food in the Last Year: Never true    Ran Out of Food in the Last Year: Never true   Transportation Needs: Not on file  Physical Activity: Not on file  Stress: Not on file  Social Connections: Not on file    Her Allergies Are:  Allergies  Allergen Reactions   Molds & Smuts Other (See Comments) and Cough    sneezing   Alpha-Gal   :   Her Current Medications Are:  Outpatient Encounter Medications as of 05/11/2023  Medication Sig   Acetaminophen  500 MG capsule 1 capsule as needed Orally every 6 hrs   albuterol  (VENTOLIN  HFA) 108 (90 Base) MCG/ACT inhaler Inhale 2 puffs into the lungs every 6 (six) hours as needed for wheezing or shortness of breath.   Elderberry 575 MG/5ML SYRP Take 30 mLs by mouth daily.   fluticasone  (FLONASE ) 50 MCG/ACT nasal spray Place 2 sprays into both nostrils daily.   hydroxychloroquine  (PLAQUENIL ) 200 MG tablet Take 1 tablet (200 mg total) by mouth 2 (two) times daily.   Lactobacillus Casei-Folic Acid  (RESTORA RX) 60-1.25 MG CAPS Take 1 capsule by mouth every morning.   lansoprazole  (PREVACID ) 30 MG capsule Take 1 capsule (30 mg total) by mouth daily.   levocetirizine (XYZAL ) 5 MG tablet Take 1 tablet (5 mg total) by mouth every evening.   Lifitegrast  (XIIDRA ) 5 % SOLN Place 1 drop into both eyes 2 (two) times daily.   ondansetron  (ZOFRAN -ODT) 4 MG disintegrating tablet Dissolve 1 tablet (4 mg total) in mouth every 8 (eight) hours as needed for nausea   PEG 3350 -KCl-NaBcb-NaCl-NaSulf (PEG-3350/ELECTROLYTES) 236 g SOLR Take 4,000 mL by mouth for 1 dose as directed per [facility] instructions.   polyethylene glycol (MIRALAX  / GLYCOLAX ) 17 g packet Take 17 g by mouth every other day.   Prucalopride Succinate  (MOTEGRITY ) 1 MG TABS Take 1 tablet by mouth daily.   rizatriptan  (MAXALT ) 5 MG tablet Take 1 tablet (5 mg total) by mouth as needed for migraine. May repeat in 2 hours if needed, no more than 2 pills/24 hours, no more than 3 pills/week.   triamcinolone  ointment (KENALOG ) 0.1 % Apply pea sized amount to affected vulvar and perianal area three times  per week. Increase to twice daily for flares.   mupirocin  ointment (BACTROBAN ) 2 % Apply 1 Application topically 2 (two) times daily. (Patient not taking: Reported on 05/11/2023)   nystatin -triamcinolone  ointment (MYCOLOG) Apply 1 Application topically daily. (Patient not taking: Reported on 05/11/2023)   Prucalopride Succinate  (MOTEGRITY ) 2 MG TABS Take 1 tablet (2 mg total) by mouth daily. (Patient not taking: Reported on 05/11/2023)   Prucalopride Succinate  2 MG TABS Take 1 tablet (2 mg total) by mouth daily. (Patient not taking: Reported on 05/11/2023)   sulfamethoxazole -trimethoprim  (BACTRIM  DS) 800-160 MG tablet Take 1 tablet by mouth 2 (two) times daily. (Patient not taking: Reported on 05/11/2023)   triamcinolone  ointment (KENALOG ) 0.1 % Apply a pea sized amount to affected vulvar and perianal area 2 (two) times daily. (Patient not taking: Reported on 05/11/2023)   UNABLE TO FIND Med Name: NIFEDIPINE 0.3%/LIDO1.5% OINT (Patient not taking: Reported on 05/11/2023)   Facility-Administered Encounter Medications as of 05/11/2023  Medication   mupirocin  ointment (BACTROBAN ) 2 %  :  Review of Systems:  Out of a complete 14 point review of systems,  all are reviewed and negative with the exception of these symptoms as listed below:  Review of Systems  Neurological:        Room 5 Migraines F/U Pt is here Alone. Pt states that her migraines have been doing pretty good since her last appointment. Pt states that she had a mild headache yesterday and took Tylenol  for it and it went away. Pt states that she is having arm pain in her right arm, that when she moves her hand a certain way it will send a sharp pain up her arm. Pt states that it throbs at night when she is sleeping. Pt has been diagnosed G6PD Deficiency and PMM2.     Objective:  Neurological Exam  Physical Exam Physical Examination:   Vitals:   05/11/23 1320  BP: 112/80  Pulse: 77    General Examination: The patient is a very pleasant 53  y.o. female in no acute distress. She appears well-developed and well-nourished and well groomed.   HEENT: Normocephalic, atraumatic, pupils are equal, round and reactive to light and accommodation.  Corrective eyeglasses in place.  No photophobia.  Extraocular tracking is good without limitation to gaze excursion or nystagmus noted. Normal smooth pursuit is noted. Hearing is grossly intact. Face is symmetric with normal facial animation. Speech is clear, without hypophonia or dysarthria, no voice tremor.  No carotid bruits.     Chest: Clear to auscultation without wheezing, rhonchi or crackles noted.   Heart: S1+S2+0, regular and normal without murmurs, rubs or gallops noted.    Abdomen: Soft, non-tender, maybe slightly distended with sporadic  bowel sounds appreciated on auscultation.   Extremities: There is no pitting edema in the distal lower extremities bilaterally. Left ankle and little wider than right with trace edema.   Skin: Warm and dry without trophic changes noted.   Musculoskeletal: exam reveals puffy hands bilaterally, left more than right, more pronounced swelling around the knuckles on the right.  Good range of motion in shoulder and elbow areas bilaterally.   Neurologically:  Mental status: The patient is awake, alert and oriented in all 4 spheres. Her immediate and remote memory, attention, language skills and fund of knowledge are appropriate. There is no evidence of aphasia, agnosia, apraxia or anomia. Speech is clear with normal prosody and enunciation. Thought process is linear. Mood is normal and affect is normal.  Cranial nerves II - XII are as described above under HEENT exam. In addition: shoulder shrug is normal with equal shoulder height noted. Motor exam: Normal bulk, strength and tone is noted.  Fine motor skills and coordination: grossly intact with normal finger taps, normal hand movements, normal rapid alternating patting, normal foot taps and normal foot  agility.  Cerebellar testing: No dysmetria or intention tremor. There is no truncal or gait ataxia.   Reflexes are 1+ throughout. Romberg is negative. Sensory exam: intact to light touch.  Gait, station and balance: She stands easily. No veering to one side is noted. No leaning to one side is noted. Posture is age-appropriate and stance is narrow based. Gait shows normal stride length and normal pace. No problems turning are noted.     Assessment and Plan:  In summary, Karen [name] is a 53 yo female with an underlying medical history of anemia, asthma, DVT, endometriosis, reflux disease, kidney stones, irritable bowel syndrome, interstitial cystitis, migraine headaches, arthritis, back pain, history of syncope, mild obstructive sleep apnea (not on PAP therapy) and mild obesity, who presents for follow-up consultation of  her facial paresthesias (on the R side) with negative stroke w/u. She does have a history of migraines. She has had significant GI issues and vulvodynia. She sleeps fairly well, she could not tolerate PAP therapy in the past. She had an EMG and nerve conduction velocity test through [facility] neurology in 2023 with benign findings. She has a prescription for Maxalt  low-dose as needed for migraines, mostly to have something on hand if she needs it.  She has not actually tried it yet. C. Doppler on 11/17/22 was benign. HST on 12/03/22 showed mild obstructive sleep apnea with a total AHI of 9.2/hour and O2 nadir of 80.2%. Snoring was detected, and appeared to be intermittent in the mild to moderate range. She was offered home autoPAP therapy but declined, wanted to continue to work on weight loss.  She is advised to stay well-hydrated and well rested.  Neurological exam is nonfocal.  She is advised to follow-up routinely in this clinic to see nurse practitioners in 1 year, I do not believe she needs any additional workup from our end of things at this time.  She is advised to keep her  appointments with her other specialists.  I answered all her questions today and she was in agreement.  I spent 30 minutes in total face-to-face time and in reviewing records during pre-charting, more than 50% of which was spent in counseling and coordination of care, reviewing test results, reviewing medications and treatment regimen and/or in discussing or reviewing the diagnosis of paresthesias, episodic migraines, the prognosis and treatment options. Pertinent laboratory and imaging test results that were available during this visit with the patient were reviewed by me and considered in my medical decision making (see chart for details).

## 2023-05-16 ENCOUNTER — Ambulatory Visit

## 2023-05-16 DIAGNOSIS — M5459 Other low back pain: Secondary | ICD-10-CM

## 2023-05-16 DIAGNOSIS — R279 Unspecified lack of coordination: Secondary | ICD-10-CM

## 2023-05-16 DIAGNOSIS — M6281 Muscle weakness (generalized): Secondary | ICD-10-CM

## 2023-05-16 DIAGNOSIS — M62838 Other muscle spasm: Secondary | ICD-10-CM | POA: Diagnosis not present

## 2023-05-16 DIAGNOSIS — R102 Pelvic and perineal pain: Secondary | ICD-10-CM

## 2023-05-16 NOTE — Therapy (Signed)
 OUTPATIENT PHYSICAL THERAPY FEMALE PELVIC TREATMENT   Patient Name: Karen [name] MRN: [id number] DOB:January 25, 1970, 53 y.o., female Today's Date: 05/16/2023  END OF SESSION:  PT End of Session - 05/16/23 1403     Visit Number 41    Date for PT Re-Evaluation 10/10/23    Authorization Type [employer]    PT Start Time 1400    PT Stop Time 1440    PT Time Calculation (min) 40 min    Activity Tolerance Patient tolerated treatment well    Behavior During Therapy [facility] for tasks assessed/performed                      Past Medical History:  Diagnosis Date   Adenomyosis    Anemia    Arthritis 2013   L knee gets steroid injections    Asthmatic bronchitis 01/31/2017   Back pain    Chronic constipation    Diarrhea    DVT of lower extremity (deep venous thrombosis) (HCC)    Dysmenorrhea    Endometriosis    Esophageal stricture    G6PD deficiency    GERD (gastroesophageal reflux disease)    History of kidney stones    History of uterine fibroid    IBS (irritable bowel syndrome)    Interstitial cystitis    Lactose intolerance    Migraine    with aura   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   PONV (postoperative nausea and vomiting)    Recurrent upper respiratory infection (URI)    Sebaceous cyst of breast, right lower inner quadrant 10/25/2012   Excised 11/21/12    Sleep apnea    Syncope, non cardiac    Past Surgical History:  Procedure Laterality Date   ARTHROSCOPIC HAGLUNDS REPAIR     BREAST CYST EXCISION Right 11/21/2012   Procedure: CYST EXCISION BREAST;  Surgeon: Darcella Earnest, MD;  Location: [facility];  Service: General;  Laterality: Right;   BREAST CYST EXCISION Bilateral 01/18/2022   Procedure: EXCISION OF SEBACEOUS CYST BILATERAL BREAST;  Surgeon: Sim Dryer, MD;  Location: [facility];  Service: General;  Laterality: Bilateral;   BREAST EXCISIONAL BIOPSY Right 11/2012   CESAREAN SECTION  04/19/1993    CHOLECYSTECTOMY     COLONOSCOPY  05/02/2011   Procedure: COLONOSCOPY;  Surgeon: Venson Ginger., MD;  Location: [facility];  Service: Endoscopy;  Laterality: N/A;   colonoscopy  11/04/2014   DENTAL SURGERY  03/06/2018   2 surgeries ( 03/06/2018 and 04/06/2018)   to remove two separate benign tumors   ESOPHAGEAL MANOMETRY N/A 11/04/2015   Procedure: ESOPHAGEAL MANOMETRY (EM);  Surgeon: Jolinda Necessary, MD;  Location: [facility];  Service: Endoscopy;  Laterality: N/A;   ESOPHAGOGASTRODUODENOSCOPY  05/02/2011   Procedure: ESOPHAGOGASTRODUODENOSCOPY (EGD);  Surgeon: Venson Ginger., MD;  Location: [facility];  Service: Endoscopy;  Laterality: N/A;   ESOPHAGOGASTRODUODENOSCOPY N/A 09/01/2014   Procedure: ESOPHAGOGASTRODUODENOSCOPY (EGD);  Surgeon: Jolinda Necessary, MD;  Location: [facility];  Service: Endoscopy;  Laterality: N/A;   KNEE SURGERY     LAPAROSCOPY     x 4   LESION REMOVAL N/A 01/18/2022   Procedure: EXCISION OF SEBACEOUS CYSTS CHEST AND NECK;  Surgeon: Sim Dryer, MD;  Location:  SUR[facility]ERY CENTER;  Service: General;  Laterality: N/A;   PH IMPEDANCE STUDY N/A 11/04/2015   Procedure: PH IMPEDANCE STUDY;  Surgeon: Jolinda Necessary, MD;  Location: [facility];  Service: Endoscopy;  Laterality: N/A;   VAGINAL HYSTERECTOMY  2010   TVH--ovaries remain   Patient Active Problem List   Diagnosis Date Noted   Sjogren syndrome with keratoconjunctivitis (HCC) 12/09/2022   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   Elevated LFTs 04/04/2021   Colitis 04/04/2021   Bacterial overgrowth syndrome 05/21/2020   Obesity 05/21/2020   History of endometriosis 05/21/2020   Constipation due to outlet dysfunction 02/05/2020   Gastroesophageal reflux disease 02/05/2020   Obstructive sleep apnea treated with continuous positive airway pressure (CPAP) 06/16/2017   Perennial allergic rhinitis with a nonallergic component 01/31/2017   Asthmatic bronchitis 01/31/2017   GI symptoms 01/31/2017    Family history of colon cancer 01/27/2015   Chest pain 12/13/2012   Dyspnea 12/13/2012   DVT of lower extremity (deep venous thrombosis) (HCC) 01/03/1990    PCP: Arva Lathe, MD  REFERRING PROVIDER: Lillian Rein, MD   REFERRING DIAG: M62.89 (ICD-10-CM) - Pelvic floor dysfunction  THERAPY DIAG:  Pelvic pain  Other low back pain  Muscle weakness (generalized)  Other muscle spasm  Unspecified lack of coordination  Rationale for Evaluation and Treatment: Rehabilitation  ONSET DATE: chronic  SUBJECTIVE:                                                                                                                                                                                           SUBJECTIVE STATEMENT:  Colonoscopy/endoscopy schedule for 05/25/23.  Pt is in Rt wrist brace due to worsening pain; X-rays were not notable for fracture. She is waiting to see the hand specialist. She tripped and almost fell over the weekend. She is having a lot of diarrhea today.    PAIN: 05/16/23 Are you having pain? Yes NPRS scale: 3/10 Pain location: Lower abdominal pain, Lt sided pain, low back pain  Pain type: turning, knotted, pressure Pain description: intermittent and constant   Aggravating factors: constipation or frequent bowel movements/constantly having bowel movements  Relieving factors: exercises (bowel massage, happy baby, spinal twists, walking)  PRECAUTIONS: None  WEIGHT BEARING RESTRICTIONS: No  FALLS:  Has patient fallen in last 6 months? No  LIVING ENVIRONMENT: Lives with: lives with their spouse Lives in: House/apartment  OCCUPATION: nurse, in admin  PLOF: Independent  PATIENT GOALS: decrease pain and have more normal bowel movements  PERTINENT HISTORY:  Vaginal hysterectomy, DVT LE, endometriosis, interstitial cystitis, exploratory laps for endometriosis, c-section, cholecystectomy Sexual abuse: No  BOWEL MOVEMENT: Pain with bowel  movement: Yes Type of bowel movement:Type (Bristol Stool Scale) 1-7 (sometimes full range in the same bowel movement), Frequency sometimes many times a day, sometimes up to 4 days in between, and Strain Yes Fully empty rectum: No Leakage: No  Pads: No Fiber supplement: No - has attempted metamucil and it made constipation worse  URINATION: Pain with urination: Yes Fully empty bladder: No Stream: varies  Urgency: Yes: all the time Frequency: multiple times an hours  Leakage: Urge to void, Walking to the bathroom, Coughing, Sneezing, Laughing, and Exercise Pads: Yes: daily  INTERCOURSE: Pain with intercourse: Initial Penetration and During Penetration Ability to have vaginal penetration:  Yes: with pain Climax: non painful WNL   PREGNANCY: Vaginal deliveries 1 Tearing Yes: 3rd degree tear C-section deliveries 1 Currently pregnant No  PROLAPSE: Periodically will feel vaginal bulging, heaviness in lower abdomen   OBJECTIVE:  05/04/23:               Internal Pelvic Floor: mild tenderness in bil levator ani, but no increase in tension; with internal rectal exam, she was demonstrating paradoxical contraction when trying to eccentrically lengthen with exhale and had very hard time correcting  Patient confirms identification and approves PT to assess internal pelvic floor and treatment Yes  PELVIC MMT:   MMT eval  Vaginal 2/5, 3 second hold, 3 repeat contractions  Internal Anal Sphincter 1/5  External Anal Sphincter 2/5  Puborectalis 1/5  Diastasis Recti   (Blank rows = not tested)        TONE: WNL   PROLAPSE: Grade 2 anterior vaginal wall laxity  04/25/23: Curl-up test: abdominal distortion Transversus abdominus: week, difficulty getting active contraction (not been working on strengthening)    LUMBARAROM/PROM:  A/PROM A/PROM  Eval (% available) 11/09/22 (% available) 04/25/23 (% available)  Flexion 50 90 90  Extension 25, pain anteriorly  25, pain in low back 50   Right lateral flexion 50, pain anteriorly  75, pain on right 50, pain on Rt  Left lateral flexion 50, pain anteriorly  75, pain on right 75   (Blank rows = not tested)  11/09/22: No internal rectal or vaginal pelvic floor exam performed due to high level of skin irritation   Weak transversus abdominus contraction  Abdominal restriction and tenderness in bil lower quadrant  Unable to perform pelvic tilt with appropriate coordination  LUMBARAROM/PROM:  A/PROM A/PROM  Eval (% available) 11/09/22 (% available)  Flexion 50 90  Extension 25, pain anteriorly  25, pain in low back  Right lateral flexion 50, pain anteriorly  75, pain on right  Left lateral flexion 50, pain anteriorly  75, pain on right   (Blank rows = not tested) 07/21/22: standing prolapse assessment demonstrated grade 2 anterior vaginal wall laxity   06/08/22:               External Perineal Exam: WNL                              Internal Pelvic Floor: burning reported with palpation of superficial muscles; deep aching/pressure with palpation of Lt levator ani   Patient confirms identification and approves PT to assess internal pelvic floor and treatment Yes  PELVIC MMT:   MMT eval  Vaginal 3/5  Internal Anal Sphincter 1/5  External Anal Sphincter 2/5  Puborectalis 1/5  Diastasis Recti   (Blank rows = not tested)        TONE: High in Lt levator ani   PROLAPSE: Not able to tell this session due to dyssynergic pelvic floor contraction     06/01/22: COGNITION: Overall cognitive status: Within functional limits for tasks assessed     SENSATION: Light touch: Appears  intact Proprioception: Appears intact  GAIT: Comments: decreased hip extension, forward flexed trunk  POSTURE: rounded shoulders, forward head, decreased lumbar lordosis, increased thoracic kyphosis, posterior pelvic tilt, and flexed trunk    LUMBARAROM/PROM:  A/PROM A/PROM  Eval (% available)  Flexion 50  Extension 25, pain anteriorly    Right lateral flexion 50, pain anteriorly   Left lateral flexion 50, pain anteriorly    (Blank rows = not tested)   PALPATION:   General  Significant abdominal restriction and tenderness; decreased rib mobility with mobilization and breathing    TODAY'S TREATMENT:                                                                                                                              DATE:  05/16/23 Manual: ILU bowel mobilization  Abdominal scar tissue mobilization Therapeutic activities: Education on MOP protocol and impact of chronic constipation  Being consistent with a bowel regimen to allow healing to take place   05/08/23 Manual: ILU bowel mobilization  Abdomina lscar tissue mobilization Neuromuscular re-education: Diaphragmatic breathing Supine with 10lb wt: 3 seconds x 12, 4 seconds x 12 Significant cues for improved pacing of breathing and motor control Sitting on towel roll for feedback with contract-relax-bulge with breathing and eccentrically lengthening Supine manual biofeedback externally on pelvis for improved pelvic floor muscle eccentric lengthening using contract-relax-bulge technique   05/04/23 Manual: Pt provides verbal consent for internal vaginal/rectal pelvic floor exam. Internal vaginal and rectal pelvic floor muscle reassessment Neuromuscular re-education: Pt provides verbal consent for internal vaginal/rectal pelvic floor exam. Internal vagina pelvic floor muscle contraction training Quick flicks Long holds Urge drill Internal rectal relaxation/bulge training Eccentric bulge with exhale in Lt side lying Therapeutic activities: Inverted lying position review  PATIENT EDUCATION:  Education details: See above Person educated: Patient Education method: Explanation, Demonstration, Tactile cues, Verbal cues, and Handouts Education comprehension: verbalized understanding  HOME EXERCISE PROGRAM: [id number]  ASSESSMENT:  CLINICAL  IMPRESSION: Pt is having more diarrhea. We talked extensively about overflow diarrhea. This makes sense based on MD prior recommendations with Miralax ; maybe she was not getting the right dose due to still having stool burden present on x-ray. She is going to have to do a cleanse prior to colonoscopy next week and then discuss with MD if she should get back on miralax  regimen. She continue sto do well with manual techniques to abdomen; she did have quite a bit of tenderness and restriction in abdominal attachments to pelvis. She will continue to benefit from skilled PT intervention in order to decrease pain, improve bowel movements, and decrease urinary urgency/incontinence.     OBJECTIVE IMPAIRMENTS: decreased activity tolerance, decreased coordination, decreased endurance, decreased mobility, decreased strength, increased fascial restrictions, increased muscle spasms, impaired tone, postural dysfunction, and pain.   ACTIVITY LIMITATIONS: lifting, bending, continence, and locomotion level  PARTICIPATION LIMITATIONS: interpersonal relationship, community activity, and occupation  PERSONAL FACTORS: 1 comorbidity: medical history are also affecting patient's functional outcome.  REHAB POTENTIAL: Good  CLINICAL DECISION MAKING: Stable/uncomplicated  EVALUATION COMPLEXITY: Low   GOALS: Goals reviewed with patient? Yes  SHORT TERM GOALS: Updated 04/25/23  Pt will be independent with HEP.   Baseline: Goal status: MET 07/12/22  2.  Pt will be independent with diaphragmatic breathing and down training activities in order to improve pelvic floor relaxation.  Baseline:  Goal status: MET 07/12/22  3.  Pt will be independent with the knack, urge suppression technique, and double voiding in order to improve bladder habits and decrease urinary incontinence.   Baseline:  Goal status: MET 09/22/22  4.  Pt will be independent with use of squatty potty, relaxed toileting mechanics, and improved bowel  movement techniques in order to increase ease of bowel movements and complete evacuation.   Baseline:  Goal status: MET 11/09/22  5.  Pt will be able to correctly perform diaphragmatic breathing and appropriate pressure management in order to prevent worsening vaginal wall laxity and improve pelvic floor A/ROM.   Baseline:  Goal status: MET 01/18/23  LONG TERM GOALS: Updated 04/25/23  Pt will be independent with advanced HEP.   Baseline:  Goal status: IN PROGRESS 04/25/23  2.  Pt will demonstrate normal pelvic floor muscle tone and A/ROM, able to achieve 4/5 strength with contractions and 10 sec endurance, in order to provide appropriate lumbopelvic support in functional activities.   Baseline: not assessed today per patient request  Goal status: IN PROGRESS 04/25/23  3.  Pt will increase all impaired lumbar A/ROM by 25% without pain.  Baseline: Pt seen a little bit of loss in some and improvement in extension Goal status:  IN PROGRESS 04/25/23  4.  Pt will report pain no higher than 3/10 with any activity. Baseline: pain has gotten up to 5/10, but currently at a 3/10 Goal status:  IN PROGRESS 04/25/23  5.  Pt will report 0/10 pain with vaginal penetration in order to improve intimate relationship with partner.    Baseline: no recent intercourse attempts  Goal status:  IN PROGRESS 04/25/23  6.  Pt will be able to go 2-3 hours in between voids without urgency or incontinence in order to improve QOL and perform all functional activities with less difficulty.   Baseline:  Goal status: MET 02/21/23  7.  Pt will report no leaks with laughing, coughing, sneezing in order to improve comfort with interpersonal relationships and community activities.   Baseline: Pt states that she feels 30-40% better Goal status:  IN PROGRESS 04/25/23  8.  Pt will have consistent bowel movements 4-5x/week without straining or pain.  Baseline:  Goal status:  MET 02/21/23  PLAN:  PT FREQUENCY:  1-2x/week  PT DURATION: 6 months  PLANNED INTERVENTIONS: Therapeutic exercises, Therapeutic activity, Neuromuscular re-education, Balance training, Gait training, Patient/Family education, Self Care, Joint mobilization, Dry Needling, Biofeedback, and Manual therapy  PLAN FOR NEXT SESSION: strengthening program - reinforce importance and go over some exercises each session; internal pelvic floor muscle exam  Verlena Glenn, PT, DPT05/13/252:04 PM

## 2023-05-18 ENCOUNTER — Telehealth: Payer: Self-pay | Admitting: *Deleted

## 2023-05-18 ENCOUNTER — Inpatient Hospital Stay: Attending: Hematology & Oncology

## 2023-05-18 ENCOUNTER — Other Ambulatory Visit: Payer: Self-pay | Admitting: *Deleted

## 2023-05-18 DIAGNOSIS — Z79620 Long term (current) use of immunosuppressive biologic: Secondary | ICD-10-CM | POA: Diagnosis not present

## 2023-05-18 DIAGNOSIS — E039 Hypothyroidism, unspecified: Secondary | ICD-10-CM

## 2023-05-18 DIAGNOSIS — M79641 Pain in right hand: Secondary | ICD-10-CM | POA: Diagnosis not present

## 2023-05-18 DIAGNOSIS — M25531 Pain in right wrist: Secondary | ICD-10-CM | POA: Diagnosis not present

## 2023-05-18 DIAGNOSIS — R790 Abnormal level of blood mineral: Secondary | ICD-10-CM

## 2023-05-18 LAB — CBC WITH DIFFERENTIAL (CANCER CENTER ONLY)
Abs Immature Granulocytes: 0.05 10*3/uL (ref 0.00–0.07)
Basophils Absolute: 0 10*3/uL (ref 0.0–0.1)
Basophils Relative: 1 %
Eosinophils Absolute: 0.2 10*3/uL (ref 0.0–0.5)
Eosinophils Relative: 4 %
HCT: 37.4 % (ref 36.0–46.0)
Hemoglobin: 12.1 g/dL (ref 12.0–15.0)
Immature Granulocytes: 1 %
Lymphocytes Relative: 30 %
Lymphs Abs: 1.4 10*3/uL (ref 0.7–4.0)
MCH: 32 pg (ref 26.0–34.0)
MCHC: 32.4 g/dL (ref 30.0–36.0)
MCV: 98.9 fL (ref 80.0–100.0)
Monocytes Absolute: 0.3 10*3/uL (ref 0.1–1.0)
Monocytes Relative: 7 %
Neutro Abs: 2.7 10*3/uL (ref 1.7–7.7)
Neutrophils Relative %: 57 %
Platelet Count: 203 10*3/uL (ref 150–400)
RBC: 3.78 MIL/uL — ABNORMAL LOW (ref 3.87–5.11)
RDW: 12.6 % (ref 11.5–15.5)
WBC Count: 4.8 10*3/uL (ref 4.0–10.5)
nRBC: 0 % (ref 0.0–0.2)

## 2023-05-18 LAB — RETICULOCYTES
Immature Retic Fract: 16.4 % — ABNORMAL HIGH (ref 2.3–15.9)
RBC.: 3.73 MIL/uL — ABNORMAL LOW (ref 3.87–5.11)
Retic Count, Absolute: 94 10*3/uL (ref 19.0–186.0)
Retic Ct Pct: 2.5 % (ref 0.4–3.1)

## 2023-05-18 LAB — CMP (CANCER CENTER ONLY)
ALT: 17 U/L (ref 0–44)
AST: 17 U/L (ref 15–41)
Albumin: 4.8 g/dL (ref 3.5–5.0)
Alkaline Phosphatase: 102 U/L (ref 38–126)
Anion gap: 6 (ref 5–15)
BUN: 12 mg/dL (ref 6–20)
CO2: 30 mmol/L (ref 22–32)
Calcium: 9.7 mg/dL (ref 8.9–10.3)
Chloride: 105 mmol/L (ref 98–111)
Creatinine: 0.77 mg/dL (ref 0.44–1.00)
GFR, Estimated: >60 mL/min (ref >60)
Glucose, Bld: 117 mg/dL — ABNORMAL HIGH (ref 70–99)
Potassium: 4.5 mmol/L (ref 3.5–5.1)
Sodium: 141 mmol/L (ref 135–145)
Total Bilirubin: 0.6 mg/dL (ref 0.0–1.2)
Total Protein: 7.1 g/dL (ref 6.5–8.1)

## 2023-05-18 LAB — FERRITIN: Ferritin: 331 ng/mL — ABNORMAL HIGH (ref 11–307)

## 2023-05-18 NOTE — Telephone Encounter (Signed)
 Message received from patient requesting to come in for lab only d/t increased headaches.  Message sent to scheduling and pt scheduled to come in today at 1:30PM for lab only.  Lab orders placed.

## 2023-05-19 ENCOUNTER — Encounter: Payer: Self-pay | Admitting: *Deleted

## 2023-05-19 ENCOUNTER — Ambulatory Visit: Attending: Surgery | Admitting: Occupational Therapy

## 2023-05-19 ENCOUNTER — Other Ambulatory Visit (HOSPITAL_COMMUNITY): Payer: Self-pay

## 2023-05-19 ENCOUNTER — Ambulatory Visit: Payer: Self-pay | Admitting: Hematology & Oncology

## 2023-05-19 ENCOUNTER — Other Ambulatory Visit: Payer: Self-pay

## 2023-05-19 DIAGNOSIS — I890 Lymphedema, not elsewhere classified: Secondary | ICD-10-CM | POA: Diagnosis not present

## 2023-05-19 LAB — IRON AND IRON BINDING CAPACITY (CC-WL,HP ONLY)
Iron: 75 ug/dL (ref 28–170)
Saturation Ratios: 25 % (ref 10.4–31.8)
TIBC: 295 ug/dL (ref 250–450)
UIBC: 220 ug/dL (ref 148–442)

## 2023-05-19 NOTE — Therapy (Signed)
 OUTPATIENT OCCUPATIONAL THERAPY TREATMENT NOTE   BLE/ BLQ LYMPHEDEMA  Patient Name: Karen [name] MRN: [id number] DOB:[birth year], 53 y.o., female Today's Date: 05/19/2023  END OF SESSION:   OT End of Session - 05/19/23 0900     Visit Number 40    Number of Visits 72    Date for OT Re-Evaluation 08/17/23    OT Start Time 0800    OT Stop Time 0900    OT Time Calculation (min) 60 min    Activity Tolerance Patient tolerated treatment well;No increased pain    Behavior During Therapy [facility] for tasks assessed/performed               Past Medical History:  Diagnosis Date   Adenomyosis    Anemia    Arthritis 2013   L knee gets steroid injections    Asthmatic bronchitis 01/31/2017   Back pain    Chronic constipation    Diarrhea    DVT of lower extremity (deep venous thrombosis) (HCC)    Dysmenorrhea    Endometriosis    Esophageal stricture    G6PD deficiency    GERD (gastroesophageal reflux disease)    History of kidney stones    History of uterine fibroid    IBS (irritable bowel syndrome)    Interstitial cystitis    Lactose intolerance    Migraine    with aura   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   PONV (postoperative nausea and vomiting)    Recurrent upper respiratory infection (URI)    Sebaceous cyst of breast, right lower inner quadrant 10/25/2012   Excised 11/21/12    Sleep apnea    Syncope, non cardiac    Past Surgical History:  Procedure Laterality Date   ARTHROSCOPIC HAGLUNDS REPAIR     BREAST CYST EXCISION Right 11/21/2012   Procedure: CYST EXCISION BREAST;  Surgeon: Darcella Earnest, MD;  Location: [facility];  Service: General;  Laterality: Right;   BREAST CYST EXCISION Bilateral 01/18/2022   Procedure: EXCISION OF SEBACEOUS CYST BILATERAL BREAST;  Surgeon: Sim Dryer, MD;  Location: [facility];  Service: General;  Laterality: Bilateral;   BREAST EXCISIONAL BIOPSY Right 11/2012   CESAREAN SECTION  04/19/1993    CHOLECYSTECTOMY     COLONOSCOPY  05/02/2011   Procedure: COLONOSCOPY;  Surgeon: Venson Ginger., MD;  Location: [facility];  Service: Endoscopy;  Laterality: N/A;   colonoscopy  11/04/2014   DENTAL SURGERY  03/06/2018   2 surgeries ( 03/06/2018 and 04/06/2018)   to remove two separate benign tumors   ESOPHAGEAL MANOMETRY N/A 11/04/2015   Procedure: ESOPHAGEAL MANOMETRY (EM);  Surgeon: Jolinda Necessary, MD;  Location: [facility];  Service: Endoscopy;  Laterality: N/A;   ESOPHAGOGASTRODUODENOSCOPY  05/02/2011   Procedure: ESOPHAGOGASTRODUODENOSCOPY (EGD);  Surgeon: Venson Ginger., MD;  Location: [facility];  Service: Endoscopy;  Laterality: N/A;   ESOPHAGOGASTRODUODENOSCOPY N/A 09/01/2014   Procedure: ESOPHAGOGASTRODUODENOSCOPY (EGD);  Surgeon: Jolinda Necessary, MD;  Location: [facility];  Service: Endoscopy;  Laterality: N/A;   KNEE SURGERY     LAPAROSCOPY     x 4   LESION REMOVAL N/A 01/18/2022   Procedure: EXCISION OF SEBACEOUS CYSTS CHEST AND NECK;  Surgeon: Sim Dryer, MD;  Location: [facility];  Service: General;  Laterality: N/A;   PH IMPEDANCE STUDY N/A 11/04/2015   Procedure: PH IMPEDANCE STUDY;  Surgeon: Jolinda Necessary, MD;  Location: [facility];  Service: Endoscopy;  Laterality: N/A;   VAGINAL HYSTERECTOMY  2010  TVH--ovaries remain   Patient Active Problem List   Diagnosis Date Noted   Sjogren syndrome with keratoconjunctivitis (HCC) 12/09/2022   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   Elevated LFTs 04/04/2021   Colitis 04/04/2021   Bacterial overgrowth syndrome 05/21/2020   Obesity 05/21/2020   History of endometriosis 05/21/2020   Constipation due to outlet dysfunction 02/05/2020   Gastroesophageal reflux disease 02/05/2020   Obstructive sleep apnea treated with continuous positive airway pressure (CPAP) 06/16/2017   Perennial allergic rhinitis with a nonallergic component 01/31/2017   Asthmatic bronchitis 01/31/2017   GI symptoms 01/31/2017    Family history of colon cancer 01/27/2015   Chest pain 12/13/2012   Dyspnea 12/13/2012   DVT of lower extremity (deep venous thrombosis) (HCC) 01/03/1990    PCP: Genny Kid, MD  REFERRING PROVIDER: Arva Lathe, MD  REFERRING DIAG: I89.0  THERAPY DIAG:  Lymphedema, not elsewhere classified  Rationale for Evaluation and Treatment: Rehabilitation  ONSET DATE: ~2014; exacerbation in 2023  SUBJECTIVE:                                                                                                                                                                                          SUBJECTIVE STATEMENT:  Pt presents for OT to address lower quadrant and LLE lymphedema.Pt was last seen on 05/03/18. Pt reports she has an endoscopy coming up and also injured her R wrist.  She presents wearing a R thumb spica splint. Dr suspects soft tissue injury. Pt states she feels "full"   in large intestines and plans "to do a cleanse" this weekend. Pt does not rate abdominal pain/ discomfort numerically. She reports that her custom compression stockings were delivered earlier in the week and they fit well. He is not wearing to the clinic this morning.  PERTINENT HISTORY: CVI, OSA (no CPAP), GERD, Esophageal stricture, IBS, Lactose intolerant, Diarrhea, Hx LE DVT  PAIN:  Are you having pain? Yes: Pls see SUBJECTIVE Pain location: LLE, digestive discomfort Pain description: myalgia, tight, heavy, tingling, numbness Aggravating factors: standing, walking, dependent sitting > 30 minutes Relieving factors: elevation  PRECAUTIONS: Other: LYMPHEDEMA; Hx LLE DVT  RED FLAGS: Hx LLE DVT  WEIGHT BEARING RESTRICTIONS: No  FALLS:  Has patient fallen in last 6 months? No  LIVING ENVIRONMENT: Lives with: lives with spouse and daughter Lives in: House/apartment Stairs: Yes; Internal: 14 steps; yes and External: 4 steps; yes Has following equipment at home: None  OCCUPATION: Garment/textile technologist, walking, desk work  LEISURE: family time  HAND DOMINANCE: right   PRIOR LEVEL OF FUNCTION: Independent  PATIENT GOALS: Feel better, be able to be more active without pain,  wear preferred street shoes  OBJECTIVE: Note: Objective measures were completed at Evaluation unless otherwise noted.  COGNITION:  Overall cognitive status: Within functional limits for tasks assessed   POSTURE: WFL  LE ROM: WFL  LE MMT: WFL  LYMPHEDEMA ASSESSMENTS: non-Ca related  INFECTIONS: denies cellulitis and wound hx  BLE COMPARATIVE LIMB VOLUMETRICS Initial 11/15/22  LANDMARK RIGHT (dominant)  R LEG (A-D) 3028.9 ml  R THIGH (E-G) 4809.60 ml  R FULL LIMB (A-G) 7838.5 ml  Limb Volume differential (LVD)  %  Volume change since initial %  Volume change overall V  (Blank rows = not tested)  LANDMARK LEFT  (Rx)  L LEG (A-D) 3189.9 ml  L THIGH (E-G) 4959.8 ml  L FULL LIMB (A-G) 8149.7 ml  Limb Volume differential (LVD)  LEG LVD = 5.32%, L>R; THIGH LVD = 3.12%, L>R; And full LLE LVD = 3.97%, L>R.   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  Skin  Description Hyper-Keratosis Peau d' Orange Shiny Tight Fibrotic/ Indurated Fatty Doughy Spongy/ boggy       R>L x  x   Skin dry Flaky WNL Macerated   mildly      Color Redness Varicosities Blanching Hemosiderin Stain Mottled        x   Odor Malodorous Yeast Fungal infection  WNL      x   Temperature Warm Cool wnl     x    Pitting Edema   1+ 2+ 3+ 4+ Non-pitting         x   Girth Symmetrical Asymmetrical                    Distribution    L>R toes to groin    Stemmer Sign Positive Negative   +L -R   Lymphorrhea History Of:  Present Absent     x    Wounds History Of Present Absent Venous Arterial Pressure Sheer     x        Signs of Infection Redness Warmth Erythema Acute Swelling Drainage Borders                    Sensation Light Touch Deep pressure Hypersensitivity   In tact Impaired In tact Impaired Absent Impaired   x  x  x     Nails WNL   Fungus nail dystrophy   x     Hair Growth Symmetrical Asymmetrical   x    Skin Creases Base of toes  Ankles   Base of Fingers knees       Abdominal pannus Thigh Lobules  Face/neck   x           GAIT: Distance walked: >500' Assistive device utilized: None Level of assistance: Complete Independence Comments: Functional ambulation and transfers [facility]  LYMPHEDEMA LIFE IMPACT SCALE (LLIS): Initial 11/15/22  50%  FOTO functional outcome measure: Deferred. FOTO discontinued.  TODAY'S TREATMENT:  MLD to BLQ emphasis on colon and LLE Pt edu for le SELF CARE  PATIENT EDUCATION:  Continued Pt/ CG edu for how function of cisterna chyli, part of the thoracic duct of deep abdominal lymphatics, assists with digestion by filtering many of the fats and proteins from the digestive system. Sub optimal lymphatic flow in this region may result in constipation, bloating, swelling, etc. MLD helps mobilize these protein and fats , and the rhythmic movement of manual lymphatic drainage stimulates digestive muscles and increase peristalsis. Provided printed resource for reference.  Topics include outcome of comparative limb volumetrics- starting limb volume differentials (LVDs), technology and gradient techniques used for short stretch, multilayer compression wrapping, simple self-MLD, therapeutic lymphatic pumping exercises, skin/nail  care, LE precautions,. compression garment recommendations and specifications, wear and care schedule and compression garment donning / doffing w assistive devices. Discussed progress towards all OT goals since commencing CDT. All questions answered to the Pt's satisfaction. Good return. Person educated: Patient  Education method: Explanation, Demonstration, and Handouts Education comprehension: verbalized understanding, returned demonstration, verbal cues required, and needs further education  HOME EXERCISE PROGRAM: BLE lymphatic pumping there ex using- 1 sets of 10 reps, each exercise in order-  1-2 x daily, bilaterally Simple self MLD 1 x daily Daily skin care to increase hydration, skin mobility and decrease infection risk- can be done during MLD Compression wraps 23/7 during intensive phase of CDT Compression garments/ devices during self-management phase of CDT  ASSESSMENT:  CLINICAL IMPRESSION:    Pt continues to tolerate and respond well to MLD accessing deep abdominal lymphatics and the ascending, transverse and descending colon as evidenced by reduced gut discomfort and no longer needing Miralax  for constipation. MLD to abdominal lymphatics is known to increase peristalsis and bowel motility, to stimulate the parasympathetic nervous system promoting relaxation of the intestinal sphincter muscles. Pt has also been able to suspend frequent intestinal cleanses.  Pt tolerated abdominal MLD without increased pain.  Performed J strokes to descending, transverse and ascending colon region , then utilized deep abdominal lymphatic pathways, including alternate pressure and release on descending, then repeated transverse, then ascending colon and diaphragmatic breathing. Provided MLD to LLE as established with good tolerance. Pt  continues to benefit from skilled OT for Lymphedema care to reduce pain and digestive discomfort. Cont  1 x weekly as per POC.  11/15/22 Initial Evaluation: Danita Taplin  is a 53 y.o. female presenting with very mild, stage II, LLE lymphedema 2/2 venous insufficiency and obesity. L Leg swelling and associated pain swelling fluctuates. It has progressed over time, and now no longer resolves with elevation over night. RLE swelling limits balance during functional ambulation. It exacerbates infection and falls risk. LLE/RLQ lymphedema interferes with functional performance in all occupational domains, including basic and instrumental ADLs, productive activities, leisure pursuits and social participation. Pt will benefit from Occupational Therapy for Complete Decongestive Therapy (CDT) to restore function, to reduce physical and psychologic suffering associated with chronic, progressive lymphedema and associated pain, and to limit infection. CDT will include manual lymphatic drainage (MLD), skin care, therapeutic exercise and compression wraps and garment's devices. Without skilled OT for lymphedema care the condition will worsen and further functional decline is expected  Custom-made gradient compression garments and HOS devices are medically necessary because they are uniquely sized and shaped to fit the exact dimensions of the affected extremities, and to provide appropriate medical grade, graduated compression essential for optimally managing chronic, progressive lymphedema. Multiple custom compression garments are needed to ensure proper  hygiene to limit infection risk. Custom compression garments should be replaced q 3-6 months When worn consistently for optimal lipo-lymphedema self-management over time. HOS devices, medically necessary to limit fibrosis buildup in tissue, should be replaced q 2 years and PRN when worn out.      OBJECTIVE IMPAIRMENTS: decreased activity tolerance, decreased knowledge of condition, decreased knowledge of use of DME, increased edema, impaired sensation, pain, and chronic, progressive leg swelling.   ACTIVITY LIMITATIONS: dependent sitting,  extended standing and/ or walking, squatting, and lower body dressing, fitting preferred street shoes  PARTICIPATION LIMITATIONS: shopping, community activity, occupation, and yard work  PERSONAL FACTORS: 3+ comorbidities: Hx DVT,  OSA, CVI. Varicose veins are also affecting patient's functional outcome.   REHAB POTENTIAL: Good  EVALUATION COMPLEXITY: Moderate   GOALS: Goals reviewed with patient? Yes  SHORT TERM GOALS: Target date: 4th OT Rx visit  SHORT TERM GOALS: Target date: 4th OT Rx visit   Pt will demonstrate understanding of lymphedema precautions and prevention strategies with modified independence using a printed reference to identify at least 5 precautions and discussing how s/he may implement them into daily life to reduce risk of progression with extra time. Baseline:Max A Goal status: GOAL MET   Pt will be able to apply multilayer, knee length, gradient, compression wraps to one leg at a time with modified assistance (extra time and assistive device/s) to decrease limb volume, to limit infection risk, and to limit lymphedema progression.  Baseline: Dependent Goal status: GOAL DISCHARGED. Pt Going to compression garments instead  LONG TERM GOALS: Target date: 02/08/23  Given this patient's Intake score of 67%/100% on the functional outcomes FOTO tool, patient will experience an increase in function of 5 points to improve basic and instrumental ADLs performance, including lymphedema self-care.  Baseline: Max A Goal status:GOAL DISCHARGED.  FOTO TOOL DISCONTINUED AT CLINIC   Given this patient's Intake score of 50% on the Lymphedema Life Impact Scale (LLIS), patient will experience a reduction of at least 5 points in her perceived level of functional impairment resulting from lymphedema to improve functional performance and quality of life (QOL). Baseline: 50% Goal status: PROGRESSING  Pt will achieve at least a 10% volume reduction in B legs to return limb to typical  size and shape, to limit infection risk and LE progression, to decrease pain, to improve function. Baseline: Dependent Goal status: PROGRESSING  4.  Pt will obtain proper compression garments/devices and achieve modified independence (extra time + assistive devices) with donning/doffing to optimize limb volume reductions and limit LE  progression over time. Baseline:  Goal status: ONGOING.  5.  During Intensive phase CDT , with modified independence, Pt will achieve at least 85% compliance with all lymphedema self-care home program components, including daily skin care, compression wraps and /or garments, simple self MLD and lymphatic pumping therex to habituate LE self care protocol  into ADLs for optimal LE self-management over time. Baseline: Dependent Goal status: PROGRESSING   PLAN:  OT FREQUENCY: 1 - 2 x/week  OT DURATION: 12 weeks  PLANNED INTERVENTIONS: 97110-Therapeutic exercises, 97530- Therapeutic activity, 97535- Self Care, [id number]- Manual therapy, Patient/Family education, Manual lymph drainage, Compression bandaging, and fit with appropriate compression garments  PLAN FOR NEXT SESSION:  Cont MLD Cont Pt edu for LE self-care  Arnold Bicker, MS, OTR/L, CLT-LANA 05/19/23 12:07 PM

## 2023-05-21 DIAGNOSIS — M25531 Pain in right wrist: Secondary | ICD-10-CM | POA: Diagnosis not present

## 2023-05-22 ENCOUNTER — Ambulatory Visit: Admitting: Podiatry

## 2023-05-22 ENCOUNTER — Ambulatory Visit

## 2023-05-22 ENCOUNTER — Encounter: Payer: Self-pay | Admitting: Podiatry

## 2023-05-22 ENCOUNTER — Inpatient Hospital Stay

## 2023-05-22 VITALS — Ht 67.0 in | Wt 207.0 lb

## 2023-05-22 DIAGNOSIS — M6281 Muscle weakness (generalized): Secondary | ICD-10-CM

## 2023-05-22 DIAGNOSIS — R279 Unspecified lack of coordination: Secondary | ICD-10-CM

## 2023-05-22 DIAGNOSIS — M5459 Other low back pain: Secondary | ICD-10-CM | POA: Diagnosis not present

## 2023-05-22 DIAGNOSIS — Z79620 Long term (current) use of immunosuppressive biologic: Secondary | ICD-10-CM | POA: Diagnosis not present

## 2023-05-22 DIAGNOSIS — L03031 Cellulitis of right toe: Secondary | ICD-10-CM | POA: Diagnosis not present

## 2023-05-22 DIAGNOSIS — M62838 Other muscle spasm: Secondary | ICD-10-CM

## 2023-05-22 DIAGNOSIS — R102 Pelvic and perineal pain: Secondary | ICD-10-CM | POA: Diagnosis not present

## 2023-05-22 NOTE — Patient Instructions (Signed)

## 2023-05-22 NOTE — Therapy (Signed)
 OUTPATIENT PHYSICAL THERAPY FEMALE PELVIC TREATMENT   Patient Name: Karen [name] MRN: [id number] DOB:[birth year], 53 y.o., female Today's Date: 05/22/2023  END OF SESSION:  PT End of Session - 05/22/23 1240     Visit Number 42    Date for PT Re-Evaluation 10/10/23    Authorization Type [employer]    PT Start Time 1230    PT Stop Time 1310    PT Time Calculation (min) 40 min    Activity Tolerance Patient tolerated treatment well    Behavior During Therapy [facility] for tasks assessed/performed                      Past Medical History:  Diagnosis Date   Adenomyosis    Anemia    Arthritis 2013   L knee gets steroid injections    Asthmatic bronchitis 01/31/2017   Back pain    Chronic constipation    Diarrhea    DVT of lower extremity (deep venous thrombosis) (HCC)    Dysmenorrhea    Endometriosis    Esophageal stricture    G6PD deficiency    GERD (gastroesophageal reflux disease)    History of kidney stones    History of uterine fibroid    IBS (irritable bowel syndrome)    Interstitial cystitis    Lactose intolerance    Migraine    with aura   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   PONV (postoperative nausea and vomiting)    Recurrent upper respiratory infection (URI)    Sebaceous cyst of breast, right lower inner quadrant 10/25/2012   Excised 11/21/12    Sleep apnea    Syncope, non cardiac    Past Surgical History:  Procedure Laterality Date   ARTHROSCOPIC HAGLUNDS REPAIR     BREAST CYST EXCISION Right 11/21/2012   Procedure: CYST EXCISION BREAST;  Surgeon: Darcella Earnest, MD;  Location: [facility];  Service: General;  Laterality: Right;   BREAST CYST EXCISION Bilateral 01/18/2022   Procedure: EXCISION OF SEBACEOUS CYST BILATERAL BREAST;  Surgeon: Sim Dryer, MD;  Location:  SURGERY C[facility];  Service: General;  Laterality: Bilateral;   BREAST EXCISIONAL BIOPSY Right 11/2012   CESAREAN SECTION  04/19/1993    CHOLECYSTECTOMY     COLONOSCOPY  05/02/2011   Procedure: COLONOSCOPY;  Surgeon: Venson Ginger., MD;  Location: [facility];  Service: Endoscopy;  Laterality: N/A;   colonoscopy  11/04/2014   DENTAL SURGERY  03/06/2018   2 surgeries ( 03/06/2018 and 04/06/2018)   to remove two separate benign tumors   ESOPHAGEAL MANOMETRY N/A 11/04/2015   Procedure: ESOPHAGEAL MANOMETRY (EM);  Surgeon: Jolinda Necessary, MD;  Location: [facility];  Service: Endoscopy;  Laterality: N/A;   ESOPHAGOGASTRODUODENOSCOPY  05/02/2011   Procedure: ESOPHAGOGASTRODUODENOSCOPY (EGD);  Surgeon: Venson Ginger., MD;  Location: [facility];  Service: Endoscopy;  Laterality: N/A;   ESOPHAGOGASTRODUODENOSCOPY N/A 09/01/2014   Procedure: ESOPHAGOGASTRODUODENOSCOPY (EGD);  Surgeon: Jolinda Necessary, MD;  Location: [facility];  Service: Endoscopy;  Laterality: N/A;   KNEE SURGERY     LAPAROSCOPY     x 4   LESION REMOVAL N/A 01/18/2022   Procedure: EXCISION OF SEBACEOUS CYSTS CHEST AND NECK;  Surgeon: Sim Dryer, MD;  Location:  SURGERY CENTER;  Service: General;  Laterality: N/A;   PH IMPEDANCE STUDY N/A 11/04/2015   Procedure: PH IMPEDANCE STUDY;  Surgeon: Jolinda Necessary, MD;  Location: [facility];  Service: Endoscopy;  Laterality: N/A;   VAGINAL HYSTERECTOMY  2010   TVH--ovaries remain   Patient Active Problem List   Diagnosis Date Noted   Sjogren syndrome with keratoconjunctivitis (HCC) 12/09/2022   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   Elevated LFTs 04/04/2021   Colitis 04/04/2021   Bacterial overgrowth syndrome 05/21/2020   Obesity 05/21/2020   History of endometriosis 05/21/2020   Constipation due to outlet dysfunction 02/05/2020   Gastroesophageal reflux disease 02/05/2020   Obstructive sleep apnea treated with continuous positive airway pressure (CPAP) 06/16/2017   Perennial allergic rhinitis with a nonallergic component 01/31/2017   Asthmatic bronchitis 01/31/2017   GI symptoms 01/31/2017    Family history of colon cancer 01/27/2015   Chest pain 12/13/2012   Dyspnea 12/13/2012   DVT of lower extremity (deep venous thrombosis) (HCC) 01/03/1990    PCP: Arva Lathe, MD  REFERRING PROVIDER: Lillian Rein, MD   REFERRING DIAG: M62.89 (ICD-10-CM) - Pelvic floor dysfunction  THERAPY DIAG:  Pelvic pain  Other low back pain  Muscle weakness (generalized)  Other muscle spasm  Unspecified lack of coordination  Rationale for Evaluation and Treatment: Rehabilitation  ONSET DATE: chronic  SUBJECTIVE:                                                                                                                                                                                           SUBJECTIVE STATEMENT:  Colonoscopy/endoscopy schedule for 05/25/23.  Pt states that bowel movements were better after having PT/OT last week. She starts her prep this Wednesday for colonoscopy.   PAIN: 05/22/23 Are you having pain? Yes NPRS scale: 3/10 Pain location: Lower abdominal pain, Lt sided pain, low back pain  Pain type: turning, knotted, pressure Pain description: intermittent and constant   Aggravating factors: constipation or frequent bowel movements/constantly having bowel movements  Relieving factors: exercises (bowel massage, happy baby, spinal twists, walking)  PRECAUTIONS: None  WEIGHT BEARING RESTRICTIONS: No  FALLS:  Has patient fallen in last 6 months? No  LIVING ENVIRONMENT: Lives with: lives with their spouse Lives in: House/apartment  OCCUPATION: nurse, in admin  PLOF: Independent  PATIENT GOALS: decrease pain and have more normal bowel movements  PERTINENT HISTORY:  Vaginal hysterectomy, DVT LE, endometriosis, interstitial cystitis, exploratory laps for endometriosis, c-section, cholecystectomy Sexual abuse: No  BOWEL MOVEMENT: Pain with bowel movement: Yes Type of bowel movement:Type (Bristol Stool Scale) 1-7 (sometimes full range in the  same bowel movement), Frequency sometimes many times a day, sometimes up to 4 days in between, and Strain Yes Fully empty rectum: No Leakage: No Pads: No Fiber supplement: No - has attempted metamucil and it made constipation worse  URINATION: Pain with urination: Yes Fully  empty bladder: No Stream: varies  Urgency: Yes: all the time Frequency: multiple times an hours  Leakage: Urge to void, Walking to the bathroom, Coughing, Sneezing, Laughing, and Exercise Pads: Yes: daily  INTERCOURSE: Pain with intercourse: Initial Penetration and During Penetration Ability to have vaginal penetration:  Yes: with pain Climax: non painful WNL   PREGNANCY: Vaginal deliveries 1 Tearing Yes: 3rd degree tear C-section deliveries 1 Currently pregnant No  PROLAPSE: Periodically will feel vaginal bulging, heaviness in lower abdomen   OBJECTIVE:  05/04/23:               Internal Pelvic Floor: mild tenderness in bil levator ani, but no increase in tension; with internal rectal exam, she was demonstrating paradoxical contraction when trying to eccentrically lengthen with exhale and had very hard time correcting  Patient confirms identification and approves PT to assess internal pelvic floor and treatment Yes  PELVIC MMT:   MMT eval  Vaginal 2/5, 3 second hold, 3 repeat contractions  Internal Anal Sphincter 1/5  External Anal Sphincter 2/5  Puborectalis 1/5  Diastasis Recti   (Blank rows = not tested)        TONE: WNL   PROLAPSE: Grade 2 anterior vaginal wall laxity  04/25/23: Curl-up test: abdominal distortion Transversus abdominus: week, difficulty getting active contraction (not been working on strengthening)    LUMBARAROM/PROM:  A/PROM A/PROM  Eval (% available) 11/09/22 (% available) 04/25/23 (% available)  Flexion 50 90 90  Extension 25, pain anteriorly  25, pain in low back 50  Right lateral flexion 50, pain anteriorly  75, pain on right 50, pain on Rt  Left lateral flexion  50, pain anteriorly  75, pain on right 75   (Blank rows = not tested)  11/09/22: No internal rectal or vaginal pelvic floor exam performed due to high level of skin irritation   Weak transversus abdominus contraction  Abdominal restriction and tenderness in bil lower quadrant  Unable to perform pelvic tilt with appropriate coordination  LUMBARAROM/PROM:  A/PROM A/PROM  Eval (% available) 11/09/22 (% available)  Flexion 50 90  Extension 25, pain anteriorly  25, pain in low back  Right lateral flexion 50, pain anteriorly  75, pain on right  Left lateral flexion 50, pain anteriorly  75, pain on right   (Blank rows = not tested) 07/21/22: standing prolapse assessment demonstrated grade 2 anterior vaginal wall laxity   06/08/22:               External Perineal Exam: WNL                              Internal Pelvic Floor: burning reported with palpation of superficial muscles; deep aching/pressure with palpation of Lt levator ani   Patient confirms identification and approves PT to assess internal pelvic floor and treatment Yes  PELVIC MMT:   MMT eval  Vaginal 3/5  Internal Anal Sphincter 1/5  External Anal Sphincter 2/5  Puborectalis 1/5  Diastasis Recti   (Blank rows = not tested)        TONE: High in Lt levator ani   PROLAPSE: Not able to tell this session due to dyssynergic pelvic floor contraction     06/01/22: COGNITION: Overall cognitive status: Within functional limits for tasks assessed     SENSATION: Light touch: Appears intact Proprioception: Appears intact  GAIT: Comments: decreased hip extension, forward flexed trunk  POSTURE: rounded shoulders, forward head, decreased lumbar  lordosis, increased thoracic kyphosis, posterior pelvic tilt, and flexed trunk    LUMBARAROM/PROM:  A/PROM A/PROM  Eval (% available)  Flexion 50  Extension 25, pain anteriorly   Right lateral flexion 50, pain anteriorly   Left lateral flexion 50, pain anteriorly    (Blank  rows = not tested)   PALPATION:   General  Significant abdominal restriction and tenderness; decreased rib mobility with mobilization and breathing    TODAY'S TREATMENT:                                                                                                                              DATE:  05/22/23 Manual: ILU bowel mobilization  Abdominal scar tissue mobilization Negative pressure soft tissue mobilization to low back and abdomen Quadratus lumborum and psoas release Neuromuscular re-education: Standing leg swings 20x bil Postural correction in standing due to stnading with knees locked out, increased anterior pelvic tilt and posterior rib rib; she performed pelvic tilts in standing to help soften and improve awareness of rib cage stacked over pelvis for better pelvic floor muscle function  05/16/23 Manual: ILU bowel mobilization  Abdominal scar tissue mobilization Therapeutic activities: Education on MOP protocol and impact of chronic constipation  Being consistent with a bowel regimen to allow healing to take place   05/08/23 Manual: ILU bowel mobilization  Abdomina lscar tissue mobilization Neuromuscular re-education: Diaphragmatic breathing Supine with 10lb wt: 3 seconds x 12, 4 seconds x 12 Significant cues for improved pacing of breathing and motor control Sitting on towel roll for feedback with contract-relax-bulge with breathing and eccentrically lengthening Supine manual biofeedback externally on pelvis for improved pelvic floor muscle eccentric lengthening using contract-relax-bulge technique  PATIENT EDUCATION:  Education details: See above Person educated: Patient Education method: Programmer, multimedia, Demonstration, Tactile cues, Verbal cues, and Handouts Education comprehension: verbalized understanding  HOME EXERCISE PROGRAM: [id number]  ASSESSMENT:  CLINICAL IMPRESSION: Pt doing better this week with bowel movements after having PT and OT last week. We  worked on fascial release of low back and lower abdomen today with negative pressure soft tissue mobilization with good tolerance. We also reviewed posture; she is observed to lock out knees and anteriorly tilt pelvis with rib cage sitting posteriorly. We discussed improving posture to help align rib cage for improved pelvic floor muscle function. Leg swings used for down training and abdominal lengthening/visceral mobilization. She will continue to benefit from skilled PT intervention in order to decrease pain, improve bowel movements, and decrease urinary urgency/incontinence.     OBJECTIVE IMPAIRMENTS: decreased activity tolerance, decreased coordination, decreased endurance, decreased mobility, decreased strength, increased fascial restrictions, increased muscle spasms, impaired tone, postural dysfunction, and pain.   ACTIVITY LIMITATIONS: lifting, bending, continence, and locomotion level  PARTICIPATION LIMITATIONS: interpersonal relationship, community activity, and occupation  PERSONAL FACTORS: 1 comorbidity: medical history are also affecting patient's functional outcome.   REHAB POTENTIAL: Good  CLINICAL DECISION MAKING: Stable/uncomplicated  EVALUATION COMPLEXITY: Low   GOALS: Goals reviewed  with patient? Yes  SHORT TERM GOALS: Updated 04/25/23  Pt will be independent with HEP.   Baseline: Goal status: MET 07/12/22  2.  Pt will be independent with diaphragmatic breathing and down training activities in order to improve pelvic floor relaxation.  Baseline:  Goal status: MET 07/12/22  3.  Pt will be independent with the knack, urge suppression technique, and double voiding in order to improve bladder habits and decrease urinary incontinence.   Baseline:  Goal status: MET 09/22/22  4.  Pt will be independent with use of squatty potty, relaxed toileting mechanics, and improved bowel movement techniques in order to increase ease of bowel movements and complete evacuation.    Baseline:  Goal status: MET 11/09/22  5.  Pt will be able to correctly perform diaphragmatic breathing and appropriate pressure management in order to prevent worsening vaginal wall laxity and improve pelvic floor A/ROM.   Baseline:  Goal status: MET 01/18/23  LONG TERM GOALS: Updated 04/25/23  Pt will be independent with advanced HEP.   Baseline:  Goal status: IN PROGRESS 04/25/23  2.  Pt will demonstrate normal pelvic floor muscle tone and A/ROM, able to achieve 4/5 strength with contractions and 10 sec endurance, in order to provide appropriate lumbopelvic support in functional activities.   Baseline: not assessed today per patient request  Goal status: IN PROGRESS 04/25/23  3.  Pt will increase all impaired lumbar A/ROM by 25% without pain.  Baseline: Pt seen a little bit of loss in some and improvement in extension Goal status:  IN PROGRESS 04/25/23  4.  Pt will report pain no higher than 3/10 with any activity. Baseline: pain has gotten up to 5/10, but currently at a 3/10 Goal status:  IN PROGRESS 04/25/23  5.  Pt will report 0/10 pain with vaginal penetration in order to improve intimate relationship with partner.    Baseline: no recent intercourse attempts  Goal status:  IN PROGRESS 04/25/23  6.  Pt will be able to go 2-3 hours in between voids without urgency or incontinence in order to improve QOL and perform all functional activities with less difficulty.   Baseline:  Goal status: MET 02/21/23  7.  Pt will report no leaks with laughing, coughing, sneezing in order to improve comfort with interpersonal relationships and community activities.   Baseline: Pt states that she feels 30-40% better Goal status:  IN PROGRESS 04/25/23  8.  Pt will have consistent bowel movements 4-5x/week without straining or pain.  Baseline:  Goal status:  MET 02/21/23  PLAN:  PT FREQUENCY: 1-2x/week  PT DURATION: 6 months  PLANNED INTERVENTIONS: Therapeutic exercises, Therapeutic  activity, Neuromuscular re-education, Balance training, Gait training, Patient/Family education, Self Care, Joint mobilization, Dry Needling, Biofeedback, and Manual therapy  PLAN FOR NEXT SESSION: strengthening program - reinforce importance and go over some exercises each session; internal pelvic floor muscle exam  Verlena Glenn, PT, DPT05/19/251:59 PM

## 2023-05-22 NOTE — Progress Notes (Signed)
 Karen [name] presents today for phlebotomy per MD orders. Phlebotomy procedure started at 0915 with 20 gauge angiocath and ended at 0935. 521 grams removed. Patient observed for 30 minutes after procedure without any incident. Snacks given Patient tolerated procedure well. IV needle removed intact.

## 2023-05-23 ENCOUNTER — Ambulatory Visit: Admitting: Occupational Therapy

## 2023-05-23 DIAGNOSIS — I890 Lymphedema, not elsewhere classified: Secondary | ICD-10-CM | POA: Diagnosis not present

## 2023-05-23 NOTE — Therapy (Signed)
 OUTPATIENT OCCUPATIONAL THERAPY TREATMENT NOTE   BLE/ BLQ LYMPHEDEMA  Patient Name: Karen [name] MRN: [id number] DOB:December 22, 1970, 53 y.o., female Today's Date: 05/23/2023  END OF SESSION:   OT End of Session - 05/23/23 1244     Visit Number 41    Number of Visits 72    Date for OT Re-Evaluation 08/17/23    OT Start Time 1000    OT Stop Time 1100    OT Time Calculation (min) 60 min    Activity Tolerance Patient tolerated treatment well;No increased pain    Behavior During Therapy [facility] for tasks assessed/performed               Past Medical History:  Diagnosis Date   Adenomyosis    Anemia    Arthritis 2013   L knee gets steroid injections    Asthmatic bronchitis 01/31/2017   Back pain    Chronic constipation    Diarrhea    DVT of lower extremity (deep venous thrombosis) (HCC)    Dysmenorrhea    Endometriosis    Esophageal stricture    G6PD deficiency    GERD (gastroesophageal reflux disease)    History of kidney stones    History of uterine fibroid    IBS (irritable bowel syndrome)    Interstitial cystitis    Lactose intolerance    Migraine    with aura   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   PONV (postoperative nausea and vomiting)    Recurrent upper respiratory infection (URI)    Sebaceous cyst of breast, right lower inner quadrant 10/25/2012   Excised 11/21/12    Sleep apnea    Syncope, non cardiac    Past Surgical History:  Procedure Laterality Date   ARTHROSCOPIC HAGLUNDS REPAIR     BREAST CYST EXCISION Right 11/21/2012   Procedure: CYST EXCISION BREAST;  Surgeon: Darcella Earnest, MD;  Location: [facility];  Service: General;  Laterality: Right;   BREAST CYST EXCISION Bilateral 01/18/2022   Procedure: EXCISION OF SEBACEOUS CYST BILATERAL BREAST;  Surgeon: Sim Dryer, MD;  Location: [facility];  Service: General;  Laterality: Bilateral;   BREAST EXCISIONAL BIOPSY Right 11/2012   CESAREAN SECTION  04/19/1993    CHOLECYSTECTOMY     COLONOSCOPY  05/02/2011   Procedure: COLONOSCOPY;  Surgeon: Venson Ginger., MD;  Location: [facility];  Service: Endoscopy;  Laterality: N/A;   colonoscopy  11/04/2014   DENTAL SURGERY  03/06/2018   2 surgeries ( 03/06/2018 and 04/06/2018)   to remove two separate benign tumors   ESOPHAGEAL MANOMETRY N/A 11/04/2015   Procedure: ESOPHAGEAL MANOMETRY (EM);  Surgeon: Jolinda Necessary, MD;  Location: [facility];  Service: Endoscopy;  Laterality: N/A;   ESOPHAGOGASTRODUODENOSCOPY  05/02/2011   Procedure: ESOPHAGOGASTRODUODENOSCOPY (EGD);  Surgeon: Venson Ginger., MD;  Location: [facility];  Service: Endoscopy;  Laterality: N/A;   ESOPHAGOGASTRODUODENOSCOPY N/A 09/01/2014   Procedure: ESOPHAGOGASTRODUODENOSCOPY (EGD);  Surgeon: Jolinda Necessary, MD;  Location: [facility];  Service: Endoscopy;  Laterality: N/A;   KNEE SURGERY     LAPAROSCOPY     x 4   LESION REMOVAL N/A 01/18/2022   Procedure: EXCISION OF SEBACEOUS CYSTS CHEST AND NECK;  Surgeon: Sim Dryer, MD;  Location: [facility];  Service: General;  Laterality: N/A;   PH IMPEDANCE STUDY N/A 11/04/2015   Procedure: PH IMPEDANCE STUDY;  Surgeon: Jolinda Necessary, MD;  Location: [facility];  Service: Endoscopy;  Laterality: N/A;   VAGINAL HYSTERECTOMY  2010  TVH--ovaries remain   Patient Active Problem List   Diagnosis Date Noted   Sjogren syndrome with keratoconjunctivitis (HCC) 12/09/2022   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   Elevated LFTs 04/04/2021   Colitis 04/04/2021   Bacterial overgrowth syndrome 05/21/2020   Obesity 05/21/2020   History of endometriosis 05/21/2020   Constipation due to outlet dysfunction 02/05/2020   Gastroesophageal reflux disease 02/05/2020   Obstructive sleep apnea treated with continuous positive airway pressure (CPAP) 06/16/2017   Perennial allergic rhinitis with a nonallergic component 01/31/2017   Asthmatic bronchitis 01/31/2017   GI symptoms 01/31/2017    Family history of colon cancer 01/27/2015   Chest pain 12/13/2012   Dyspnea 12/13/2012   DVT of lower extremity (deep venous thrombosis) (HCC) 01/03/1990    PCP: Genny Kid, MD  REFERRING PROVIDER: Arva Lathe, MD  REFERRING DIAG: I89.0  THERAPY DIAG:  Lymphedema, not elsewhere classified  Rationale for Evaluation and Treatment: Rehabilitation  ONSET DATE: ~2014; exacerbation in 2023  SUBJECTIVE:                                                                                                                                                                                          SUBJECTIVE STATEMENT:  Pt presents for OT to address lower quadrant and LLE lymphedema and associated pain/ discomfort. Pt reports she has an endoscopy coming up. She brings MRI report showing she may have tendons at the base of the thumb, She continues to wear thumb spica splint and is in hand and wrist pain 4/10.     PERTINENT HISTORY: CVI, OSA (no CPAP), GERD, Esophageal stricture, IBS, Lactose intolerant, Diarrhea, Hx LE DVT  PAIN:  Are you having pain? Yes: Pls see SUBJECTIVE Pain location: LLE, digestive discomfort Pain description: myalgia, tight, heavy, tingling, numbness Aggravating factors: standing, walking, dependent sitting > 30 minutes Relieving factors: elevation  PRECAUTIONS: Other: LYMPHEDEMA; Hx LLE DVT  RED FLAGS: Hx LLE DVT  WEIGHT BEARING RESTRICTIONS: No  FALLS:  Has patient fallen in last 6 months? No  LIVING ENVIRONMENT: Lives with: lives with spouse and daughter Lives in: House/apartment Stairs: Yes; Internal: 14 steps; yes and External: 4 steps; yes Has following equipment at home: None  OCCUPATION: [employer] operational officer, walking, desk work  LEISURE: family time  HAND DOMINANCE: right   PRIOR LEVEL OF FUNCTION: Independent  PATIENT GOALS: Feel better, be able to be more active without pain, wear preferred street shoes  OBJECTIVE: Note: Objective  measures were completed at Evaluation unless otherwise noted.  COGNITION:  Overall cognitive status: Within functional limits for tasks assessed   POSTURE: WFL  LE ROM: WFL  LE MMT: [facility]  LYMPHEDEMA ASSESSMENTS: non-Ca related  INFECTIONS: denies cellulitis and wound hx  BLE COMPARATIVE LIMB VOLUMETRICS Initial 11/15/22  LANDMARK RIGHT (dominant)  R LEG (A-D) 3028.9 ml  R THIGH (E-G) 4809.60 ml  R FULL LIMB (A-G) 7838.5 ml  Limb Volume differential (LVD)  %  Volume change since initial %  Volume change overall V  (Blank rows = not tested)  LANDMARK LEFT  (Rx)  L LEG (A-D) 3189.9 ml  L THIGH (E-G) 4959.8 ml  L FULL LIMB (A-G) 8149.7 ml  Limb Volume differential (LVD)  LEG LVD = 5.32%, L>R; THIGH LVD = 3.12%, L>R; And full LLE LVD = 3.97%, L>R.   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  Skin  Description Hyper-Keratosis Peau d' Orange Shiny Tight Fibrotic/ Indurated Fatty Doughy Spongy/ boggy       R>L x  x   Skin dry Flaky WNL Macerated   mildly      Color Redness Varicosities Blanching Hemosiderin Stain Mottled        x   Odor Malodorous Yeast Fungal infection  WNL      x   Temperature Warm Cool wnl     x    Pitting Edema   1+ 2+ 3+ 4+ Non-pitting         x   Girth Symmetrical Asymmetrical                   Distribution    L>R toes to groin    Stemmer Sign Positive Negative   +L -R   Lymphorrhea History Of:  Present Absent     x    Wounds History Of Present Absent Venous Arterial Pressure Sheer     x        Signs of Infection Redness Warmth  Erythema Acute Swelling Drainage Borders                    Sensation Light Touch Deep pressure Hypersensitivity   In tact Impaired In tact Impaired Absent Impaired   x  x  x     Nails WNL   Fungus nail dystrophy   x     Hair Growth Symmetrical Asymmetrical   x    Skin Creases Base of toes  Ankles   Base of Fingers knees       Abdominal pannus Thigh Lobules  Face/neck   x           GAIT: Distance walked: >500' Assistive device utilized: None Level of assistance: Complete Independence Comments: Functional ambulation and transfers [facility]  LYMPHEDEMA LIFE IMPACT SCALE (LLIS): Initial 11/15/22  50%  FOTO functional outcome measure: Deferred. FOTO discontinued.  TODAY'S TREATMENT:  MLD to BLQ emphasis on colon and LLE Pt edu for le SELF CARE  PATIENT EDUCATION:  Continued Pt/ CG edu for how function of cisterna chyli, part of the thoracic duct of deep abdominal lymphatics, assists with digestion by filtering many of the fats and proteins from the digestive system. Sub optimal lymphatic flow in this region may result in constipation, bloating, swelling, etc. MLD helps mobilize these protein and fats , and the rhythmic movement of manual lymphatic drainage stimulates digestive muscles and increase peristalsis. Provided printed resource for reference.  Topics include outcome of comparative limb volumetrics- starting limb volume differentials (LVDs), technology and gradient techniques used for short stretch, multilayer compression wrapping, simple self-MLD, therapeutic lymphatic pumping exercises, skin/nail care, LE precautions,. compression garment recommendations and specifications, wear and care schedule and compression garment donning / doffing w assistive devices. Discussed progress towards all OT goals since commencing CDT. All questions answered to  the Pt's satisfaction. Good return. Person educated: Patient  Education method: Explanation, Demonstration, and Handouts Education comprehension: verbalized understanding, returned demonstration, verbal cues required, and needs further education  HOME EXERCISE PROGRAM: BLE lymphatic pumping there ex using- 1 sets of 10 reps, each exercise in order-  1-2 x daily, bilaterally Simple self MLD 1 x daily Daily skin care to increase hydration, skin mobility and decrease infection risk- can be done during MLD Compression wraps 23/7 during intensive phase of CDT Compression garments/ devices during self-management phase of CDT  ASSESSMENT:  CLINICAL IMPRESSION:    Pt continues to tolerate and respond well to MLD accessing deep abdominal lymphatics and the ascending, transverse and descending colon as evidenced by reduced gut discomfort and no longer needing Miralax  for constipation. MLD to abdominal lymphatics is known to increase peristalsis and bowel motility, to stimulate the parasympathetic nervous system promoting relaxation of the intestinal sphincter muscles. Pt has also been able to suspend frequent intestinal cleanses.  Pt tolerated abdominal MLD without increased pain.  Performed J strokes to descending, transverse and ascending colon region , then utilized deep abdominal lymphatic pathways, including alternate pressure and release on descending, then repeated transverse, then ascending colon and diaphragmatic breathing. Provided MLD to LLE as established with good tolerance. Pt  continues to benefit from skilled OT for Lymphedema care to reduce pain and digestive discomfort. Cont  1 x weekly as per POC.  11/15/22 Initial Evaluation: Karen [name] is a 53 y.o. female presenting with very mild, stage II, LLE lymphedema 2/2 venous insufficiency and obesity. L Leg swelling and associated pain swelling fluctuates. It has progressed over time, and now no longer resolves with elevation over night. RLE  swelling limits balance during functional ambulation. It exacerbates infection and falls risk. LLE/RLQ lymphedema interferes with functional performance in all occupational domains, including basic and instrumental ADLs, productive activities, leisure pursuits and social participation. Pt will benefit from Occupational Therapy for Complete Decongestive Therapy (CDT) to restore function, to reduce physical and psychologic suffering associated with chronic, progressive lymphedema and associated pain, and to limit infection. CDT will include manual lymphatic drainage (MLD), skin care, therapeutic exercise and compression wraps and garment's devices. Without skilled OT for lymphedema care the condition will worsen and further functional decline is expected  Custom-made gradient compression garments and HOS devices are medically necessary because they are uniquely sized and shaped to fit the exact dimensions of the affected extremities, and to provide appropriate medical grade, graduated compression essential for optimally managing chronic, progressive lymphedema. Multiple custom compression garments are needed to ensure proper  hygiene to limit infection risk. Custom compression garments should be replaced q 3-6 months When worn consistently for optimal lipo-lymphedema self-management over time. HOS devices, medically necessary to limit fibrosis buildup in tissue, should be replaced q 2 years and PRN when worn out.      OBJECTIVE IMPAIRMENTS: decreased activity tolerance, decreased knowledge of condition, decreased knowledge of use of DME, increased edema, impaired sensation, pain, and chronic, progressive leg swelling.   ACTIVITY LIMITATIONS: dependent sitting, extended standing and/ or walking, squatting, and lower body dressing, fitting preferred street shoes  PARTICIPATION LIMITATIONS: shopping, community activity, occupation, and yard work  PERSONAL FACTORS: 3+ comorbidities: Hx DVT,  OSA, CVI. Varicose  veins are also affecting patient's functional outcome.   REHAB POTENTIAL: Good  EVALUATION COMPLEXITY: Moderate   GOALS: Goals reviewed with patient? Yes  SHORT TERM GOALS: Target date: 4th OT Rx visit  SHORT TERM GOALS: Target date: 4th OT Rx visit   Pt will demonstrate understanding of lymphedema precautions and prevention strategies with modified independence using a printed reference to identify at least 5 precautions and discussing how s/he may implement them into daily life to reduce risk of progression with extra time. Baseline:Max A Goal status: GOAL MET   Pt will be able to apply multilayer, knee length, gradient, compression wraps to one leg at a time with modified assistance (extra time and assistive device/s) to decrease limb volume, to limit infection risk, and to limit lymphedema progression.  Baseline: Dependent Goal status: GOAL DISCHARGED. Pt Going to compression garments instead  LONG TERM GOALS: Target date: 02/08/23  Given this patient's Intake score of 67%/100% on the functional outcomes FOTO tool, patient will experience an increase in function of 5 points to improve basic and instrumental ADLs performance, including lymphedema self-care.  Baseline: Max A Goal status:GOAL DISCHARGED.  FOTO TOOL DISCONTINUED AT CLINIC   Given this patient's Intake score of 50% on the Lymphedema Life Impact Scale (LLIS), patient will experience a reduction of at least 5 points in her perceived level of functional impairment resulting from lymphedema to improve functional performance and quality of life (QOL). Baseline: 50% Goal status: PROGRESSING  Pt will achieve at least a 10% volume reduction in B legs to return limb to typical size and shape, to limit infection risk and LE progression, to decrease pain, to improve function. Baseline: Dependent Goal status: PROGRESSING  4.  Pt will obtain proper compression garments/devices and achieve modified independence (extra time +  assistive devices) with donning/doffing to optimize limb volume reductions and limit LE  progression over time. Baseline:  Goal status: ONGOING.  5.  During Intensive phase CDT , with modified independence, Pt will achieve at least 85% compliance with all lymphedema self-care home program components, including daily skin care, compression wraps and /or garments, simple self MLD and lymphatic pumping therex to habituate LE self care protocol  into ADLs for optimal LE self-management over time. Baseline: Dependent Goal status: PROGRESSING   PLAN:  OT FREQUENCY: 1 - 2 x/week  OT DURATION: 12 weeks  PLANNED INTERVENTIONS: 97110-Therapeutic exercises, 97530- Therapeutic activity, 97535- Self Care, 16109- Manual therapy, Patient/Family education, Manual lymph drainage, Compression bandaging, and fit with appropriate compression garments  PLAN FOR NEXT SESSION:  Cont MLD Cont Pt edu for LE self-care  Arnold Bicker, MS, OTR/L, CLT-LANA 05/23/23 12:49 PM

## 2023-05-25 DIAGNOSIS — R1310 Dysphagia, unspecified: Secondary | ICD-10-CM | POA: Diagnosis not present

## 2023-05-25 DIAGNOSIS — K529 Noninfective gastroenteritis and colitis, unspecified: Secondary | ICD-10-CM | POA: Diagnosis not present

## 2023-05-25 DIAGNOSIS — R197 Diarrhea, unspecified: Secondary | ICD-10-CM | POA: Diagnosis not present

## 2023-05-25 DIAGNOSIS — K648 Other hemorrhoids: Secondary | ICD-10-CM | POA: Diagnosis not present

## 2023-05-26 DIAGNOSIS — R197 Diarrhea, unspecified: Secondary | ICD-10-CM | POA: Diagnosis not present

## 2023-05-30 ENCOUNTER — Encounter: Payer: Self-pay | Admitting: Allergy & Immunology

## 2023-05-30 ENCOUNTER — Ambulatory Visit: Admitting: Occupational Therapy

## 2023-05-30 ENCOUNTER — Ambulatory Visit: Payer: Commercial Managed Care - PPO | Admitting: Allergy & Immunology

## 2023-05-30 ENCOUNTER — Other Ambulatory Visit: Payer: Self-pay

## 2023-05-30 VITALS — BP 118/62 | HR 73 | Temp 97.9°F | Resp 18 | Ht 66.93 in | Wt 207.4 lb

## 2023-05-30 DIAGNOSIS — R0602 Shortness of breath: Secondary | ICD-10-CM

## 2023-05-30 DIAGNOSIS — B999 Unspecified infectious disease: Secondary | ICD-10-CM | POA: Diagnosis not present

## 2023-05-30 DIAGNOSIS — D75A Glucose-6-phosphate dehydrogenase (G6PD) deficiency without anemia: Secondary | ICD-10-CM | POA: Diagnosis not present

## 2023-05-30 DIAGNOSIS — R7982 Elevated C-reactive protein (CRP): Secondary | ICD-10-CM

## 2023-05-30 DIAGNOSIS — R198 Other specified symptoms and signs involving the digestive system and abdomen: Secondary | ICD-10-CM | POA: Diagnosis not present

## 2023-05-30 DIAGNOSIS — R1310 Dysphagia, unspecified: Secondary | ICD-10-CM

## 2023-05-30 DIAGNOSIS — I890 Lymphedema, not elsewhere classified: Secondary | ICD-10-CM | POA: Diagnosis not present

## 2023-05-30 NOTE — Therapy (Signed)
 OUTPATIENT OCCUPATIONAL THERAPY TREATMENT NOTE   BLE/ BLQ LYMPHEDEMA  Patient Name: Karen [name] MRN: [id number] DOB:1970/06/25, 53 y.o., female Today's Date: 05/30/2023  END OF SESSION:   OT End of Session - 05/30/23 0814     Visit Number 42    Number of Visits 72    Date for OT Re-Evaluation 08/17/23    OT Start Time 0810    OT Stop Time 0905    OT Time Calculation (min) 55 min    Activity Tolerance Patient tolerated treatment well;No increased pain    Behavior During Therapy [facility] for tasks assessed/performed               Past Medical History:  Diagnosis Date   Adenomyosis    Anemia    Arthritis 2013   L knee gets steroid injections    Asthmatic bronchitis 01/31/2017   Back pain    Chronic constipation    Diarrhea    DVT of lower extremity (deep venous thrombosis) (HCC)    Dysmenorrhea    Endometriosis    Esophageal stricture    G6PD deficiency    GERD (gastroesophageal reflux disease)    History of kidney stones    History of uterine fibroid    IBS (irritable bowel syndrome)    Interstitial cystitis    Lactose intolerance    Migraine    with aura   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   PONV (postoperative nausea and vomiting)    Recurrent upper respiratory infection (URI)    Sebaceous cyst of breast, right lower inner quadrant 10/25/2012   Excised 11/21/12    Sleep apnea    Syncope, non cardiac    Past Surgical History:  Procedure Laterality Date   ARTHROSCOPIC HAGLUNDS REPAIR     BREAST CYST EXCISION Right 11/21/2012   Procedure: CYST EXCISION BREAST;  Surgeon: Darcella Earnest, MD;  Location: [facility];  Service: General;  Laterality: Right;   BREAST CYST EXCISION Bilateral 01/18/2022   Procedure: EXCISION OF SEBACEOUS CYST BILATERAL BREAST;  Surgeon: Sim Dryer, MD;  Location: [facility];  Service: General;  Laterality: Bilateral;   BREAST EXCISIONAL BIOPSY Right 11/2012   CESAREAN SECTION  04/19/1993    CHOLECYSTECTOMY     COLONOSCOPY  05/02/2011   Procedure: COLONOSCOPY;  Surgeon: Venson Ginger., MD;  Location: [facility];  Service: Endoscopy;  Laterality: N/A;   colonoscopy  11/04/2014   DENTAL SURGERY  03/06/2018   2 surgeries ( 03/06/2018 and 04/06/2018)   to remove two separate benign tumors   ESOPHAGEAL MANOMETRY N/A 11/04/2015   Procedure: ESOPHAGEAL MANOMETRY (EM);  Surgeon: Jolinda Necessary, MD;  Location: [facility];  Service: Endoscopy;  Laterality: N/A;   ESOPHAGOGASTRODUODENOSCOPY  05/02/2011   Procedure: ESOPHAGOGASTRODUODENOSCOPY (EGD);  Surgeon: Venson Ginger., MD;  Location: [facility];  Service: Endoscopy;  Laterality: N/A;   ESOPHAGOGASTRODUODENOSCOPY N/A 09/01/2014   Procedure: ESOPHAGOGASTRODUODENOSCOPY (EGD);  Surgeon: Jolinda Necessary, MD;  Location: [facility];  Service: Endoscopy;  Laterality: N/A;   KNEE SURGERY     LAPAROSCOPY     x 4   LESION REMOVAL N/A 01/18/2022   Procedure: EXCISION OF SEBACEOUS CYSTS CHEST AND NECK;  Surgeon: Sim Dryer, MD;  Location: [facility];  Service: General;  Laterality: N/A;   PH IMPEDANCE STUDY N/A 11/04/2015   Procedure: PH IMPEDANCE STUDY;  Surgeon: Jolinda Necessary, MD;  Location: [facility];  Service: Endoscopy;  Laterality: N/A;   VAGINAL HYSTERECTOMY  2010  TVH--ovaries remain   Patient Active Problem List   Diagnosis Date Noted   Sjogren syndrome with keratoconjunctivitis (HCC) 12/09/2022   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   Elevated LFTs 04/04/2021   Colitis 04/04/2021   Bacterial overgrowth syndrome 05/21/2020   Obesity 05/21/2020   History of endometriosis 05/21/2020   Constipation due to outlet dysfunction 02/05/2020   Gastroesophageal reflux disease 02/05/2020   Obstructive sleep apnea treated with continuous positive airway pressure (CPAP) 06/16/2017   Perennial allergic rhinitis with a nonallergic component 01/31/2017   Asthmatic bronchitis 01/31/2017   GI symptoms 01/31/2017    Family history of colon cancer 01/27/2015   Chest pain 12/13/2012   Dyspnea 12/13/2012   DVT of lower extremity (deep venous thrombosis) (HCC) 01/03/1990    PCP: Genny Kid, MD  REFERRING PROVIDER: Arva Lathe, MD  REFERRING DIAG: I89.0  THERAPY DIAG:  Lymphedema, not elsewhere classified  Rationale for Evaluation and Treatment: Rehabilitation  ONSET DATE: ~2014; exacerbation in 2023  SUBJECTIVE:                                                                                                                                                                                          SUBJECTIVE STATEMENT:  Pt presents for OT to address lower quadrant and LLE lymphedema and associated pain/ discomfort. Pt reports she has an endoscopy and colonoscopy coming up. Pt continues to wear thumb spica splint and is in hand and wrist pain 4/10. Pt reports digestive discomfort/ pain this morning 2/10. She reports she "did a cleanse this weekend" after 3 days of constipation.   Pt and OT discussed plan going forward in keeping with medical necessity. Pt continues to report symptom relief,  including decreased gut and abdominal discomfort and improved bowel motility / peristalsis following skilled MLD therapy  sessions.  PERTINENT HISTORY: CVI, OSA (no CPAP), GERD, Esophageal stricture, IBS, Lactose intolerant, Diarrhea, Hx LE DVT  PAIN:  Are you having pain? Yes: 2/10  Pain location: LLE, digestive discomfort Pain description: myalgia, tight, heavy, tingling, numbness Aggravating factors: standing, walking, dependent sitting > 30 minutes Relieving factors: elevation  PRECAUTIONS: Other: LYMPHEDEMA; Hx LLE DVT  RED FLAGS: Hx LLE DVT  WEIGHT BEARING RESTRICTIONS: No  FALLS:  Has patient fallen in last 6 months? No  LIVING ENVIRONMENT: Lives with: lives with spouse and daughter Lives in: House/apartment Stairs: Yes; Internal: 14 steps; yes and External: 4 steps; yes Has following  equipment at home: None  OCCUPATION: Dipl[employer] Services operational officer, walking, desk work  LEISURE: family time  HAND DOMINANCE: right   PRIOR LEVEL OF FUNCTION: Independent  PATIENT GOALS: Feel better, be able to be more  active without pain, wear preferred street shoes  OBJECTIVE: Note: Objective measures were completed at Evaluation unless otherwise noted.  COGNITION:  Overall cognitive status: Within functional limits for tasks assessed   POSTURE: WFL  LE ROM: WFL  LE MMT: WFL  LYMPHEDEMA ASSESSMENTS: non-Ca related  INFECTIONS: denies cellulitis and wound hx  BLE COMPARATIVE LIMB VOLUMETRICS Initial 11/15/22  LANDMARK RIGHT (dominant)  R LEG (A-D) 3028.9 ml  R THIGH (E-G) 4809.60 ml  R FULL LIMB (A-G) 7838.5 ml  Limb Volume differential (LVD)  %  Volume change since initial %  Volume change overall V  (Blank rows = not tested)  LANDMARK LEFT  (Rx)  L LEG (A-D) 3189.9 ml  L THIGH (E-G) 4959.8 ml  L FULL LIMB (A-G) 8149.7 ml  Limb Volume differential (LVD)  LEG LVD = 5.32%, L>R; THIGH LVD = 3.12%, L>R; And full LLE LVD = 3.97%, L>R.   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  Skin  Description Hyper-Keratosis Peau d' Orange Shiny Tight Fibrotic/ Indurated Fatty Doughy Spongy/ boggy       R>L x  x   Skin dry Flaky WNL Macerated   mildly      Color Redness Varicosities Blanching Hemosiderin Stain Mottled        x   Odor Malodorous Yeast Fungal infection  WNL      x   Temperature Warm Cool wnl     x    Pitting Edema   1+ 2+ 3+ 4+ Non-pitting         x    Girth Symmetrical Asymmetrical                   Distribution    L>R toes to groin    Stemmer Sign Positive Negative   +L -R   Lymphorrhea History Of:  Present Absent     x    Wounds History Of Present Absent Venous Arterial Pressure Sheer     x        Signs of Infection Redness Warmth Erythema Acute Swelling Drainage Borders                    Sensation Light Touch Deep pressure Hypersensitivity   In tact Impaired In tact Impaired Absent Impaired   x  x  x     Nails WNL   Fungus nail dystrophy   x     Hair Growth Symmetrical Asymmetrical   x    Skin Creases Base of toes  Ankles   Base of Fingers knees       Abdominal pannus Thigh Lobules  Face/neck   x           GAIT: Distance walked: >500' Assistive device utilized: None Level of assistance: Complete Independence Comments: Functional ambulation and transfers [facility]  LYMPHEDEMA LIFE IMPACT SCALE (LLIS): Initial 11/15/22  50%  FOTO functional outcome measure: Deferred. FOTO discontinued.  TODAY'S TREATMENT:  MLD to BLQ emphasis on colon and LLE Pt edu for le SELF CARE  PATIENT EDUCATION:  Continued Pt/ CG edu for how function of cisterna chyli, part of the thoracic duct of deep abdominal lymphatics, assists with digestion by filtering many of the fats and proteins from the digestive system. Sub optimal lymphatic flow in this region may result in constipation, bloating, swelling, etc. MLD helps mobilize these protein and fats , and the rhythmic movement of manual lymphatic drainage stimulates digestive muscles and increase peristalsis. Provided printed resource for reference.  Topics include outcome of comparative limb volumetrics- starting limb volume differentials (LVDs), technology and gradient techniques used for short stretch, multilayer compression wrapping, simple  self-MLD, therapeutic lymphatic pumping exercises, skin/nail care, LE precautions,. compression garment recommendations and specifications, wear and care schedule and compression garment donning / doffing w assistive devices. Discussed progress towards all OT goals since commencing CDT. All questions answered to the Pt's satisfaction. Good return. Person educated: Patient  Education method: Explanation, Demonstration, and Handouts Education comprehension: verbalized understanding, returned demonstration, verbal cues required, and needs further education  HOME EXERCISE PROGRAM: BLE lymphatic pumping there ex using- 1 sets of 10 reps, each exercise in order-  1-2 x daily, bilaterally Simple self MLD 1 x daily Daily skin care to increase hydration, skin mobility and decrease infection risk- can be done during MLD Compression wraps 23/7 during intensive phase of CDT Compression garments/ devices during self-management phase of CDT  ASSESSMENT:  CLINICAL IMPRESSION:    Pt continues to tolerate and respond well to MLD accessing deep abdominal lymphatics and the ascending, transverse and descending colon as evidenced by reduced gut discomfort and constipation. MLD to abdominal lymphatics is known to increase peristalsis and to stimulate the parasympathetic nervous system promoting relaxation of the intestinal sphincter muscles. Pt has also been able to suspend frequent intestinal cleanses, reports decreased constipation and more regular bowel movements with regular MLD. Pt reports recently when she missed a week of OT these symptoms worsened.  Pt tolerated abdominal MLD to deep gut  lymphatics without increased pain.  Performed short neck sequence as established, then performed alternate pressure and release on descending, transverse and ascending colon combined with diaphragmatic breathing in supine with knees flexed. We completed J strokes starting at distal end of colon (descending), then transverse and  finally ascending colon region. No lower extremity MLD today due to time constraints. Pt is reportedly pleased with custom knee high compression garments. OT has not assessed fit and function in clinic. Pt  continues to benefit from skilled OT for Lymphedema care to reduce constipation and digestive discomfort. Next session continue teaching simple self MLD and encourage family visit to clinic to learn MLD techniques. Cont  1 x weekly as per POC.  11/15/22 Initial Evaluation: Karen [name] is a 53 y.o. female presenting with very mild, stage II, LLE lymphedema 2/2 venous insufficiency and obesity. L Leg swelling and associated pain swelling fluctuates. It has progressed over time, and now no longer resolves with elevation over night. RLE swelling limits balance during functional ambulation. It exacerbates infection and falls risk. LLE/RLQ lymphedema interferes with functional performance in all occupational domains, including basic and instrumental ADLs, productive activities, leisure pursuits and social participation. Pt will benefit from Occupational Therapy for Complete Decongestive Therapy (CDT) to restore function, to reduce physical and psychologic suffering associated with chronic, progressive lymphedema and associated pain, and to limit infection. CDT will include manual lymphatic drainage (MLD), skin care, therapeutic exercise and compression wraps  and garment's devices. Without skilled OT for lymphedema care the condition will worsen and further functional decline is expected  Custom-made gradient compression garments and HOS devices are medically necessary because they are uniquely sized and shaped to fit the exact dimensions of the affected extremities, and to provide appropriate medical grade, graduated compression essential for optimally managing chronic, progressive lymphedema. Multiple custom compression garments are needed to ensure proper hygiene to limit infection risk. Custom compression  garments should be replaced q 3-6 months When worn consistently for optimal lipo-lymphedema self-management over time. HOS devices, medically necessary to limit fibrosis buildup in tissue, should be replaced q 2 years and PRN when worn out.      OBJECTIVE IMPAIRMENTS: decreased activity tolerance, decreased knowledge of condition, decreased knowledge of use of DME, increased edema, impaired sensation, pain, and chronic, progressive leg swelling.   ACTIVITY LIMITATIONS: dependent sitting, extended standing and/ or walking, squatting, and lower body dressing, fitting preferred street shoes  PARTICIPATION LIMITATIONS: shopping, community activity, occupation, and yard work  PERSONAL FACTORS: 3+ comorbidities: Hx DVT,  OSA, CVI. Varicose veins are also affecting patient's functional outcome.   REHAB POTENTIAL: Good  EVALUATION COMPLEXITY: Moderate   GOALS: Goals reviewed with patient? Yes  SHORT TERM GOALS: Target date: 4th OT Rx visit  SHORT TERM GOALS: Target date: 4th OT Rx visit   Pt will demonstrate understanding of lymphedema precautions and prevention strategies with modified independence using a printed reference to identify at least 5 precautions and discussing how s/he may implement them into daily life to reduce risk of progression with extra time. Baseline:Max A Goal status: GOAL MET   Pt will be able to apply multilayer, knee length, gradient, compression wraps to one leg at a time with modified assistance (extra time and assistive device/s) to decrease limb volume, to limit infection risk, and to limit lymphedema progression.  Baseline: Dependent Goal status: GOAL DISCHARGED. Pt Going to compression garments instead  LONG TERM GOALS: Target date: 02/08/23  Given this patient's Intake score of 67%/100% on the functional outcomes FOTO tool, patient will experience an increase in function of 5 points to improve basic and instrumental ADLs performance, including lymphedema  self-care.  Baseline: Max A Goal status:GOAL DISCHARGED.  FOTO TOOL DISCONTINUED AT CLINIC   Given this patient's Intake score of 50% on the Lymphedema Life Impact Scale (LLIS), patient will experience a reduction of at least 5 points in her perceived level of functional impairment resulting from lymphedema to improve functional performance and quality of life (QOL). Baseline: 50% Goal status: PROGRESSING  Pt will achieve at least a 10% volume reduction in B legs to return limb to typical size and shape, to limit infection risk and LE progression, to decrease pain, to improve function. Baseline: Dependent Goal status: PROGRESSING  4.  Pt will obtain proper compression garments/devices and achieve modified independence (extra time + assistive devices) with donning/doffing to optimize limb volume reductions and limit LE  progression over time. Baseline:  Goal status:GOAL MET  5.  During Intensive phase CDT , with modified independence, Pt will achieve at least 85% compliance with all lymphedema self-care home program components, including daily skin care, compression wraps and /or garments, simple self MLD and lymphatic pumping therex to habituate LE self care protocol  into ADLs for optimal LE self-management over time. Baseline: Dependent Goal status:GOAL MET   PLAN:  OT FREQUENCY: 1 /week  OT DURATION: 12 weeks and PRN  PLANNED INTERVENTIONS: 97110-Therapeutic exercises, 97530- Therapeutic activity, 97535- Self  Care, 16109- Manual therapy, Patient/Family education, Manual lymph drainage, Compression bandaging, and fit with appropriate compression garments  PLAN FOR NEXT SESSION:  Cont MLD as established Pt and family edu for LE self-care  Arnold Bicker, MS, OTR/L, CLT-LANA 05/30/23 10:12 AM

## 2023-05-30 NOTE — Patient Instructions (Addendum)
 1. SOB (shortness of breath) - Lung testing not done today. - We are not going to make any changes at this point in time.  - You can continue with the albuterol  2 puffs every 4-6 hours.  2. Dysphagia and chronic diarrhea - Continue with the Prevacid  30mg  daily.  - We will follow up on biopsies.  - We will follow up on whether we need to start Dupixent . - One idea is to start immunoglobulin replacement as an immunomodulatory agent. - But this would be hard to get set up since your labs have been normal.  - I had another patient (not as complicated as you, obviously!) who had chronic diarrhea and it improved when we started her on immunoglobulin, but she also had recurrent infections and inadequate response to Streptococcus pneumoniae on labs).   3. Recurrent sinusitis  - Continue with Flonase  one spray per nostril daily.  - Your titers were very protective following the vaccination. - We can do a Streptococcal avidity assay.   4. G6PD deficiency  - I emailed you an article on this disorder. - I can refer you to our registered dietitian who works with our food allergy patients, as she might be able to give you some more guidance regarding her diet.  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  6. Return in about 6 months (around 11/30/2023). You can have the follow up appointment with Dr. Idolina Maker or a Nurse Practicioner (our Nurse Practitioners are excellent and always have Physician oversight!).    I am happy to fill out paperwork for short-term disability for you! We should have that done by next week!   Please inform us  of any Emergency Department visits, hospitalizations, or changes in symptoms. Call us  before going to the ED for breathing or allergy symptoms since we might be able to fit you in for a sick visit. Feel free to contact us   anytime with any questions, problems, or concerns.  It was a pleasure to see you again today!  Websites that have reliable patient information: 1. American Academy of Asthma, Allergy, and Immunology: www.aaaai.org 2. Food Allergy Research and Education (FARE): foodallergy.org 3. Mothers of Asthmatics: http://www.asthmacommunitynetwork.org 4. American College of Allergy, Asthma, and Immunology: www.acaai.org      "Like" us  on Facebook and Instagram for our latest updates!      A healthy democracy works best when [employer] participate! Make sure you are registered to vote! If you have moved or changed any of your contact information, you will need to get this updated before voting! Scan the QR codes below to learn more!

## 2023-05-30 NOTE — Progress Notes (Unsigned)
 FOLLOW UP  Date of Service/Encounter:  05/30/23   Assessment:   SOB (shortness of breath) - largely resolved   3mm RML lung nodule (benign per imaging guidelines)   Diarrhea - with a history of SIBO s/p treatment by Dr. 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 Louana Roup in January 2025  Plan/Recommendations:   There are no Patient Instructions on file for this visit.   Subjective:   Karen [name] is a 53 y.o. female presenting today for follow up of  Chief Complaint  Patient presents with   Establish Care   Asthma    Karen [name] ENDIA Karen [name] has a history of the following: Patient Active Problem List   Diagnosis Date Noted   Sjogren syndrome with keratoconjunctivitis (HCC) 12/09/2022   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   Elevated LFTs 04/04/2021   Colitis 04/04/2021   Bacterial overgrowth syndrome 05/21/2020   Obesity 05/21/2020   History of endometriosis 05/21/2020   Constipation due to outlet dysfunction 02/05/2020   Gastroesophageal reflux disease 02/05/2020   Obstructive sleep apnea treated with continuous positive airway pressure (CPAP) 06/16/2017   Perennial allergic rhinitis with a nonallergic component 01/31/2017   Asthmatic bronchitis 01/31/2017   GI symptoms 01/31/2017   Family history of colon cancer 01/27/2015   Chest pain 12/13/2012   Dyspnea 12/13/2012   DVT of lower extremity (deep venous thrombosis) (HCC) 01/03/1990    History obtained from: chart review and {Persons; PED relatives w/patient:19415::"patient"}.  Discussed the use of AI scribe software for clinical note  transcription with the patient and/or guardian, who gave verbal consent to proceed.  Karen [name] is a 53 y.o. female presenting for {Blank single:19197::"a food challenge","a drug challenge","skin testing","a sick visit","an evaluation of ***","a follow up visit"}.  She was last seen in February 2025.  At that time, we did not do lung testing.  We continue with albuterol  every 4-6 hours as needed.  For dysphagia, we continue with Prevacid  30 mg daily.  She continues to [facility] with GI [facility].  We discussed starting Dupixent  for eosinophilic esophagitis.  For her recurrent sinusitis, we will continue with Flonase .  She did have excellent protection against streptococcal pneumonia.  For her G6PD deficiency.  We emailed her an article on this disorder.  We also talked about sending her to see a registered dietitian.  Since last visit,  Asthma/Respiratory Symptom History: ***  Allergic Rhinitis Symptom History: ***  Food Allergy Symptom History: ***  Skin Symptom History: ***  GERD Symptom History: ***  EGD is still pending.   Colonoscopy:      The perianal and digital rectal examinations were normal.       The terminal ileum appeared normal.       Normal mucosa was found in the entire colon. Biopsies were taken with a       cold forceps for histology.       Internal hemorrhoids were found during retroflexion. The hemorrhoids       were moderate.    Impression:            - No endoscopic esophageal abnormality to  explain                         patient's dysphagia. Biopsied.                         - Four biopsies were obtained in the upper third of                         the esophagus and in the lower third of the esophagus.                         - Normal stomach.                         - Normal examined duodenum. Biopsied.     Infection Symptom History: ***  Otherwise, there have been no changes to her past medical history, surgical history, family history, or social  history.    Review of systems otherwise negative other than that mentioned in the HPI.    Objective:   Blood pressure 118/62, pulse 73, temperature 97.9 F (36.6 C), temperature source Temporal, resp. rate 18, height 5' 6.93" (1.7 m), weight 207 lb 6.4 oz (94.1 kg), SpO2 97%. Body mass index is 32.55 kg/m.    Physical Exam   Diagnostic studies: {Blank single:19197::"none","deferred due to recent antihistamine use","deferred due to insurance stipulations that require a separate visit for testing","labs sent instead"," "}  Spirometry: {Blank single:19197::"results normal (FEV1: ***%, FVC: ***%, FEV1/FVC: ***%)","results abnormal (FEV1: ***%, FVC: ***%, FEV1/FVC: ***%)"}.    {Blank single:19197::"Spirometry consistent with mild obstructive disease","Spirometry consistent with moderate obstructive disease","Spirometry consistent with severe obstructive disease","Spirometry consistent with possible restrictive disease","Spirometry consistent with mixed obstructive and restrictive disease","Spirometry uninterpretable due to technique","Spirometry consistent with normal pattern"}. {Blank single:19197::"Albuterol /Atrovent nebulizer","Xopenex/Atrovent nebulizer","Albuterol  nebulizer","Albuterol  four puffs via MDI","Xopenex four puffs via MDI"} treatment given in clinic with {Blank single:19197::"significant improvement in FEV1 per ATS criteria","significant improvement in FVC per ATS criteria","significant improvement in FEV1 and FVC per ATS criteria","improvement in FEV1, but not significant per ATS criteria","improvement in FVC, but not significant per ATS criteria","improvement in FEV1 and FVC, but not significant per ATS criteria","no improvement"}.  Allergy Studies: {Blank single:19197::"none","deferred due to recent antihistamine use","deferred due to insurance stipulations that require a separate visit for testing","labs sent instead"," "}    {Blank single:19197::"Allergy testing results  were read and interpreted by myself, documented by clinical staff."," "}      Karen Gentles, MD  [facility] 

## 2023-05-31 DIAGNOSIS — M79641 Pain in right hand: Secondary | ICD-10-CM | POA: Diagnosis not present

## 2023-05-31 DIAGNOSIS — M25531 Pain in right wrist: Secondary | ICD-10-CM | POA: Diagnosis not present

## 2023-05-31 DIAGNOSIS — M778 Other enthesopathies, not elsewhere classified: Secondary | ICD-10-CM | POA: Diagnosis not present

## 2023-06-01 ENCOUNTER — Other Ambulatory Visit (HOSPITAL_COMMUNITY): Payer: Self-pay

## 2023-06-01 ENCOUNTER — Telehealth: Payer: Self-pay | Admitting: *Deleted

## 2023-06-01 ENCOUNTER — Telehealth: Payer: Self-pay | Admitting: Allergy & Immunology

## 2023-06-01 ENCOUNTER — Encounter: Payer: Self-pay | Admitting: Allergy & Immunology

## 2023-06-01 DIAGNOSIS — M0600 Rheumatoid arthritis without rheumatoid factor, unspecified site: Secondary | ICD-10-CM | POA: Diagnosis not present

## 2023-06-01 DIAGNOSIS — R5383 Other fatigue: Secondary | ICD-10-CM | POA: Diagnosis not present

## 2023-06-01 DIAGNOSIS — M79642 Pain in left hand: Secondary | ICD-10-CM | POA: Diagnosis not present

## 2023-06-01 DIAGNOSIS — M3501 Sicca syndrome with keratoconjunctivitis: Secondary | ICD-10-CM | POA: Diagnosis not present

## 2023-06-01 DIAGNOSIS — L280 Lichen simplex chronicus: Secondary | ICD-10-CM | POA: Diagnosis not present

## 2023-06-01 DIAGNOSIS — Z6832 Body mass index (BMI) 32.0-32.9, adult: Secondary | ICD-10-CM | POA: Diagnosis not present

## 2023-06-01 DIAGNOSIS — E669 Obesity, unspecified: Secondary | ICD-10-CM | POA: Diagnosis not present

## 2023-06-01 DIAGNOSIS — M79641 Pain in right hand: Secondary | ICD-10-CM | POA: Diagnosis not present

## 2023-06-01 DIAGNOSIS — R809 Proteinuria, unspecified: Secondary | ICD-10-CM | POA: Diagnosis not present

## 2023-06-01 MED ORDER — METHOTREXATE SODIUM 2.5 MG PO TABS
10.0000 mg | ORAL_TABLET | ORAL | 3 refills | Status: DC
  Filled 2023-06-01: qty 16, 28d supply, fill #0

## 2023-06-01 MED ORDER — FOLIC ACID 1 MG PO TABS
1.0000 mg | ORAL_TABLET | Freq: Every day | ORAL | 3 refills | Status: DC
  Filled 2023-06-01: qty 90, 90d supply, fill #0

## 2023-06-01 NOTE — Telephone Encounter (Signed)
 Message received from patient to inform Dr. Maria Shiner that she had an appt with Dr. Meredith Stalls today and was diagnosed with RA and has been put on Methotrexate weekly and Folic Acid  once a day and would like to know if Dr. Maria Shiner agrees with this plan. Dr. Maria Shiner notified.  Call placed back to patient and patient notified that Dr. Maria Shiner is ok with the plan of Methotrexate weekly and Folic Acid  daily.  Pt is appreciative of call back and has no further questions at this time.

## 2023-06-01 NOTE — Telephone Encounter (Signed)
 PT called following appt with rheumatologist to provide Dr. Idolina Maker with following info: 1) Diagnosed w/rheumatoid arthritis (dr wants to be aggressive with treatment to get remission - potentially available via [facility]) 2) RX for methotrexate  and folic acid  3) Colonoscopy shows normal readings, so likely won't need Dupixent  4) Any suggestions or advice going forward? 5) Update on paperwork for short term disability?   Pt advised can send message via myChart or calling [phone]. I advised may be until next Monday for provider to review due to being out of office, she acknowledged and thanked

## 2023-06-05 IMAGING — CR DG ABDOMEN 2V
2 series · 2 of 2 positions shown · non-contrast
Comparison: Abdominal radiograph 01/15/2019

CLINICAL DATA: Pain. Patient reports worsening constipation and
diarrhea since having COVID several weeks ago.

EXAM:
ABDOMEN - 2 VIEW

[w abdomen upright]
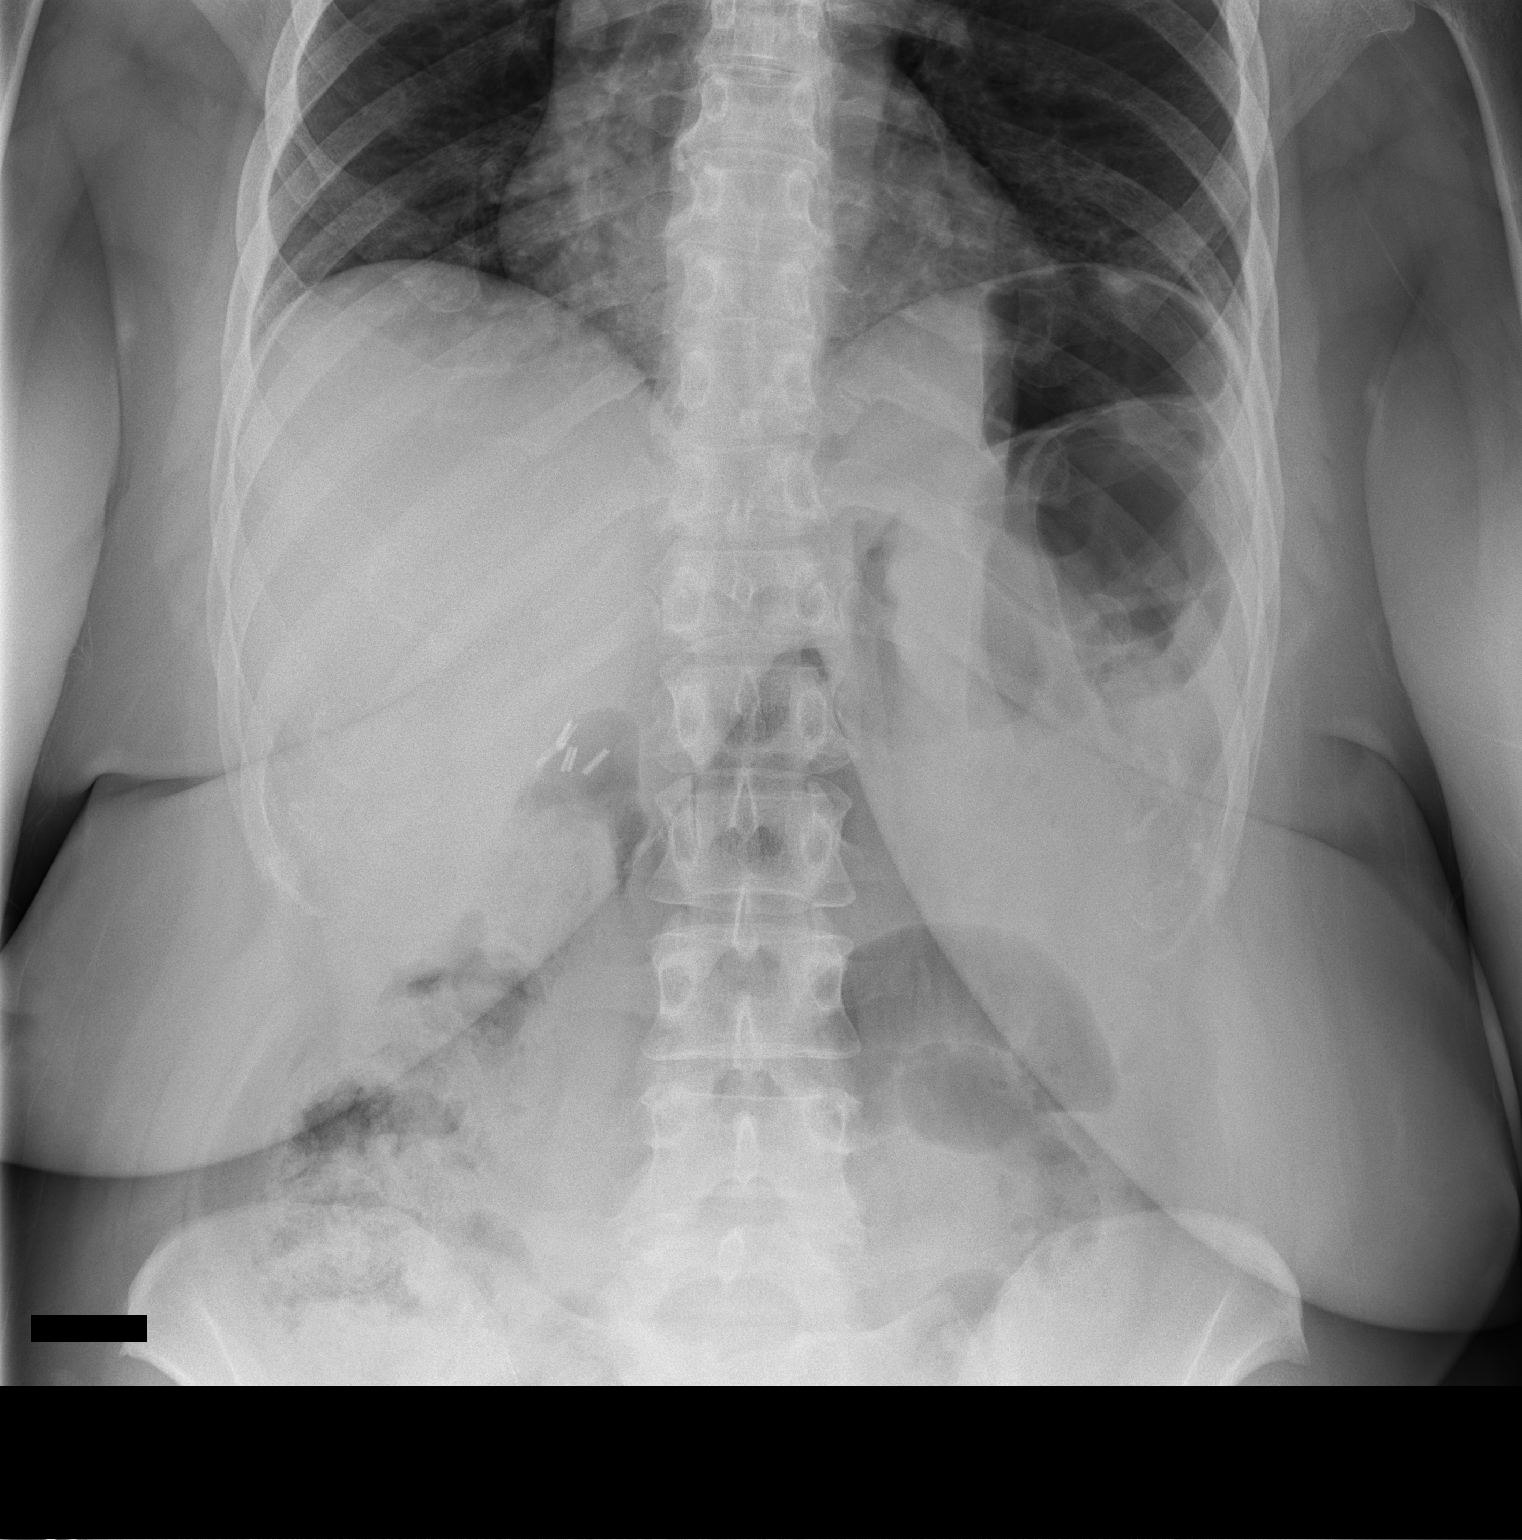

[t abdomen supine]
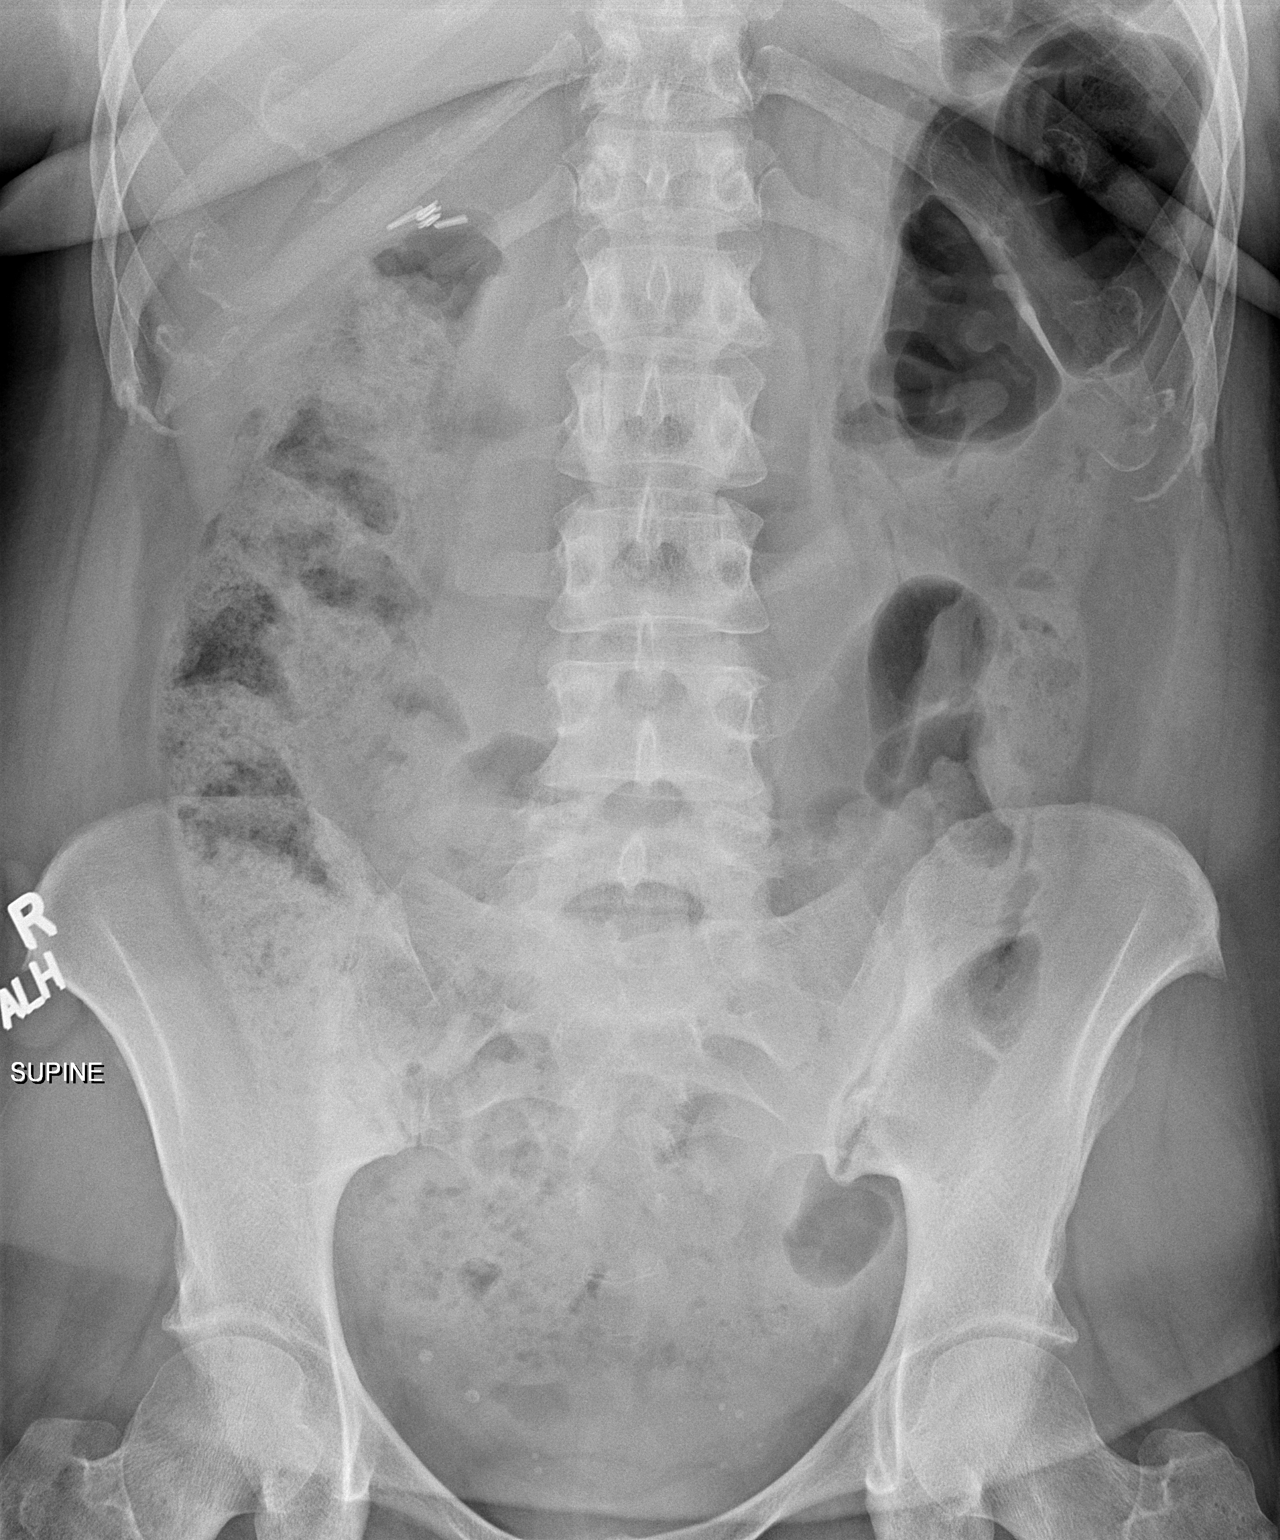

[2 of 2 positions shown; findings below may reference images not displayed]

FINDINGS: Supine and upright views of the abdomen obtained. No free
intra-abdominal air. Moderate stool in the right colon. Small volume
of stool in the descending colon. Mild gaseous distention of splenic
flexure. There is single prominent small bowel loop in the left
abdomen with air-fluid level. No evidence of obstruction.
Cholecystectomy clips in the right upper quadrant. Radiopaque
calculi or abnormal soft tissue calcifications. Pelvic phleboliths
are stable from prior exam included lung bases are clear. No acute
osseous abnormalities are seen.
IMPRESSION: Single prominent small bowel loop in the left abdomen with air-fluid
level, may be enteritis or ileus. No evidence of obstruction or free
air.

## 2023-06-06 DIAGNOSIS — M79641 Pain in right hand: Secondary | ICD-10-CM | POA: Diagnosis not present

## 2023-06-06 DIAGNOSIS — M25531 Pain in right wrist: Secondary | ICD-10-CM | POA: Diagnosis not present

## 2023-06-07 ENCOUNTER — Encounter: Payer: Self-pay | Admitting: Occupational Therapy

## 2023-06-07 ENCOUNTER — Ambulatory Visit: Attending: Surgery | Admitting: Occupational Therapy

## 2023-06-07 ENCOUNTER — Ambulatory Visit: Attending: Obstetrics & Gynecology

## 2023-06-07 DIAGNOSIS — M62838 Other muscle spasm: Secondary | ICD-10-CM | POA: Diagnosis not present

## 2023-06-07 DIAGNOSIS — M5459 Other low back pain: Secondary | ICD-10-CM | POA: Diagnosis not present

## 2023-06-07 DIAGNOSIS — M6281 Muscle weakness (generalized): Secondary | ICD-10-CM | POA: Diagnosis not present

## 2023-06-07 DIAGNOSIS — I890 Lymphedema, not elsewhere classified: Secondary | ICD-10-CM

## 2023-06-07 DIAGNOSIS — R102 Pelvic and perineal pain: Secondary | ICD-10-CM | POA: Diagnosis not present

## 2023-06-07 DIAGNOSIS — R279 Unspecified lack of coordination: Secondary | ICD-10-CM | POA: Insufficient documentation

## 2023-06-07 NOTE — Therapy (Signed)
 OUTPATIENT OCCUPATIONAL THERAPY TREATMENT NOTE   BLE/ BLQ LYMPHEDEMA  Patient Name: Karen [name] MRN: [id number] DOB:1970/09/13, 53 y.o., female Today's Date: 06/07/2023  END OF SESSION:   OT End of Session - 06/07/23 0807     Visit Number 43    Number of Visits 72    Date for OT Re-Evaluation 08/17/23    OT Start Time 0800    OT Stop Time 0930    OT Time Calculation (min) 90 min    Activity Tolerance Patient tolerated treatment well;No increased pain    Behavior During Therapy [facility] for tasks assessed/performed               Past Medical History:  Diagnosis Date   Adenomyosis    Anemia    Arthritis 2013   L knee gets steroid injections    Asthmatic bronchitis 01/31/2017   Back pain    Chronic constipation    Diarrhea    DVT of lower extremity (deep venous thrombosis) (HCC)    Dysmenorrhea    Endometriosis    Esophageal stricture    G6PD deficiency    GERD (gastroesophageal reflux disease)    History of kidney stones    History of uterine fibroid    IBS (irritable bowel syndrome)    Interstitial cystitis    Lactose intolerance    Migraine    with aura   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   PONV (postoperative nausea and vomiting)    Recurrent upper respiratory infection (URI)    Sebaceous cyst of breast, right lower inner quadrant 10/25/2012   Excised 11/21/12    Sleep apnea    Syncope, non cardiac    Past Surgical History:  Procedure Laterality Date   ARTHROSCOPIC HAGLUNDS REPAIR     BREAST CYST EXCISION Right 11/21/2012   Procedure: CYST EXCISION BREAST;  Surgeon: Darcella Earnest, MD;  Location: [facility];  Service: General;  Laterality: Right;   BREAST CYST EXCISION Bilateral 01/18/2022   Procedure: EXCISION OF SEBACEOUS CYST BILATERAL BREAST;  Surgeon: Sim Dryer, MD;  Location: [facility];  Service: General;  Laterality: Bilateral;   BREAST EXCISIONAL BIOPSY Right 11/2012   CESAREAN SECTION  04/19/1993    CHOLECYSTECTOMY     COLONOSCOPY  05/02/2011   Procedure: COLONOSCOPY;  Surgeon: Venson Ginger., MD;  Location: [facility];  Service: Endoscopy;  Laterality: N/A;   colonoscopy  11/04/2014   DENTAL SURGERY  03/06/2018   2 surgeries ( 03/06/2018 and 04/06/2018)   to remove two separate benign tumors   ESOPHAGEAL MANOMETRY N/A 11/04/2015   Procedure: ESOPHAGEAL MANOMETRY (EM);  Surgeon: Jolinda Necessary, MD;  Location: [facility];  Service: Endoscopy;  Laterality: N/A;   ESOPHAGOGASTRODUODENOSCOPY  05/02/2011   Procedure: ESOPHAGOGASTRODUODENOSCOPY (EGD);  Surgeon: Venson Ginger., MD;  Location: [facility];  Service: Endoscopy;  Laterality: N/A;   ESOPHAGOGASTRODUODENOSCOPY N/A 09/01/2014   Procedure: ESOPHAGOGASTRODUODENOSCOPY (EGD);  Surgeon: Jolinda Necessary, MD;  Location: [facility];  Service: Endoscopy;  Laterality: N/A;   KNEE SURGERY     LAPAROSCOPY     x 4   LESION REMOVAL N/A 01/18/2022   Procedure: EXCISION OF SEBACEOUS CYSTS CHEST AND NECK;  Surgeon: Sim Dryer, MD;  Location: [facility];  Service: General;  Laterality: N/A;   PH IMPEDANCE STUDY N/A 11/04/2015   Procedure: PH IMPEDANCE STUDY;  Surgeon: Jolinda Necessary, MD;  Location: [facility];  Service: Endoscopy;  Laterality: N/A;   VAGINAL HYSTERECTOMY  2010  TVH--ovaries remain   Patient Active Problem List   Diagnosis Date Noted   Sjogren syndrome with keratoconjunctivitis (HCC) 12/09/2022   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   Elevated LFTs 04/04/2021   Colitis 04/04/2021   Bacterial overgrowth syndrome 05/21/2020   Obesity 05/21/2020   History of endometriosis 05/21/2020   Constipation due to outlet dysfunction 02/05/2020   Gastroesophageal reflux disease 02/05/2020   Obstructive sleep apnea treated with continuous positive airway pressure (CPAP) 06/16/2017   Perennial allergic rhinitis with a nonallergic component 01/31/2017   Asthmatic bronchitis 01/31/2017   GI symptoms 01/31/2017    Family history of colon cancer 01/27/2015   Chest pain 12/13/2012   Dyspnea 12/13/2012   DVT of lower extremity (deep venous thrombosis) (HCC) 01/03/1990    PCP: Genny Kid, MD  REFERRING PROVIDER: Arva Lathe, MD  REFERRING DIAG: I89.0  THERAPY DIAG:  Lymphedema, not elsewhere classified  Rationale for Evaluation and Treatment: Rehabilitation  ONSET DATE: ~2014; exacerbation in 2023  SUBJECTIVE:                                                                                                                                                                                          SUBJECTIVE STATEMENT:  Pt presents for OT to address lower quadrant and LLE lymphedema and associated pain/ discomfort. Pt reports recent colonoscopy and endoscopy were both negative. Pt reports she received a dx of RA during the interval. Pt continues to wear thumb spica splint and is in hand and wrist pain 4/10. Pt reports digestive discomfort/ pain this morning 2/10. [name]   Pt and OT discussed plan going forward in keeping with medical necessity. Pt continues to report symptom relief,  including decreased gut and abdominal discomfort and improved bowel motility / peristalsis following skilled MLD therapy  sessions.  PERTINENT HISTORY: CVI, OSA (no CPAP), GERD, Esophageal stricture, IBS, Lactose intolerant, Diarrhea, Hx LE DVT  PAIN:  Are you having pain? Yes: 2/10  Pain location: LLE, digestive discomfort Pain description: myalgia, tight, heavy, tingling, numbness Aggravating factors: standing, walking, dependent sitting > 30 minutes Relieving factors: elevation  PRECAUTIONS: Other: LYMPHEDEMA; Hx LLE DVT  WEIGHT BEARING RESTRICTIONS: No  FALLS:  Has patient fallen in last 6 months? No  LIVING ENVIRONMENT: Lives with: lives with spouse and daughter Lives in: House/apartment Stairs: Yes; Internal: 14 steps; yes and External: 4 steps; yes Has following equipment at home: None  OCCUPATION:  Diplomatic Services operational officer, walking, desk work  LEISURE: family time  HAND DOMINANCE: right   PRIOR LEVEL OF FUNCTION: Independent  PATIENT GOALS: Feel better, be able to be more active without pain, wear preferred street shoes  OBJECTIVE: Note: Objective measures were completed at Evaluation unless otherwise noted.  COGNITION:  Overall cognitive status: Within functional limits for tasks assessed   POSTURE: WFL  LE ROM: WFL  LE MMT: WFL  LYMPHEDEMA ASSESSMENTS: non-Ca related  INFECTIONS: denies cellulitis and wound hx  BLE COMPARATIVE LIMB VOLUMETRICS Initial 11/15/22  LANDMARK RIGHT (dominant)  R LEG (A-D) 3028.9 ml  R THIGH (E-G) 4809.60 ml  R FULL LIMB (A-G) 7838.5 ml  Limb Volume differential (LVD)  %  Volume change since initial %  Volume change overall V  (Blank rows = not tested)  LANDMARK LEFT  (Rx)  L LEG (A-D) 3189.9 ml  L THIGH (E-G) 4959.8 ml  L FULL LIMB (A-G) 8149.7 ml  Limb Volume differential (LVD)  LEG LVD = 5.32%, L>R; THIGH LVD = 3.12%, L>R; And full LLE LVD = 3.97%, L>R.   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  Skin  Description Hyper-Keratosis Peau d' Orange Shiny Tight Fibrotic/ Indurated Fatty Doughy Spongy/ boggy       R>L x  x   Skin dry Flaky WNL Macerated   mildly      Color Redness Varicosities Blanching Hemosiderin Stain Mottled        x   Odor Malodorous Yeast Fungal infection  WNL      x   Temperature Warm Cool wnl     x    Pitting Edema   1+ 2+ 3+ 4+ Non-pitting         x   Girth Symmetrical Asymmetrical                    Distribution    L>R toes to groin    Stemmer Sign Positive Negative   +L -R   Lymphorrhea History Of:  Present Absent     x    Wounds History Of Present Absent Venous Arterial Pressure Sheer     x        Signs of Infection Redness Warmth Erythema Acute Swelling Drainage Borders                    Sensation Light Touch Deep pressure Hypersensitivity   In tact Impaired In tact Impaired Absent Impaired   x  x  x     Nails WNL   Fungus nail dystrophy   x     Hair Growth Symmetrical Asymmetrical   x    Skin Creases Base of toes  Ankles   Base of Fingers knees       Abdominal pannus Thigh Lobules  Face/neck   x           GAIT: Distance walked: >500' Assistive device utilized: None Level of assistance: Complete Independence Comments: Functional ambulation and transfers [facility]  LYMPHEDEMA LIFE IMPACT SCALE (LLIS): Initial 11/15/22  50%  FOTO functional outcome measure: Deferred. FOTO discontinued.  TODAY'S TREATMENT:  MLD to BLQ emphasis on colon and LLE Pt edu for le SELF CARE  PATIENT EDUCATION:  Continued Pt/ CG edu for how function of cisterna chyli, part of the thoracic duct of deep abdominal lymphatics, assists with digestion by filtering many of the fats and proteins from the digestive system. Sub optimal lymphatic flow in this region may result in constipation, bloating, swelling, etc. MLD helps mobilize these protein and fats , and the rhythmic movement of manual lymphatic drainage stimulates digestive muscles and increase peristalsis. Provided printed resource for reference.  Topics include outcome of comparative limb volumetrics- starting limb volume differentials (LVDs), technology and gradient techniques used for short stretch, multilayer compression wrapping, simple self-MLD, therapeutic lymphatic pumping exercises,  skin/nail care, LE precautions,. compression garment recommendations and specifications, wear and care schedule and compression garment donning / doffing w assistive devices. Discussed progress towards all OT goals since commencing CDT. All questions answered to the Pt's satisfaction. Good return. Person educated: Patient  Education method: Explanation, Demonstration, and Handouts Education comprehension: verbalized understanding, returned demonstration, verbal cues required, and needs further education  HOME EXERCISE PROGRAM: BLE lymphatic pumping there ex using- 1 sets of 10 reps, each exercise in order-  1-2 x daily, bilaterally Simple self MLD 1 x daily Daily skin care to increase hydration, skin mobility and decrease infection risk- can be done during MLD Compression wraps 23/7 during intensive phase of CDT Compression garments/ devices during self-management phase of CDT  ASSESSMENT:  CLINICAL IMPRESSION:    Pt continues to tolerate and respond well to MLD accessing deep abdominal lymphatics and the ascending, transverse and descending colon as evidenced by reduced gut discomfort and constipation. MLD to abdominal lymphatics is known to increase peristalsis and to stimulate the parasympathetic nervous system promoting relaxation of the intestinal sphincter muscles. Pt has also been able to suspend frequent intestinal cleanses, reports decreased constipation and more regular bowel movements with regular MLD. Pt reports recently when she missed a week of OT these symptoms worsened. Pt tolerated abdominal MLD to deep gut  lymphatics without increased pain.  Performed short neck sequence as established, then performed alternate pressure and release on descending, transverse and ascending colon combined with diaphragmatic breathing in supine with knees flexed. We completed J strokes starting at distal end of colon (descending), then transverse and finally ascending colon region.   I lieu of new RA  dx emphasis of Pt edu today on joint protection rational and techniques for hand/finger arthritis, work simplification and energy conservation. Reviewed principals and techniques. Demonstrated sample of Comfort Cool soft neoprene thumb CMC support and encouraged Pt to use these on her thumbs when performing repetitive tasks with force. Reviewed ergonomic positioning at home and at work. Provided printed handouts.Pt states she relies on family;y members to assist her with donning and doffing compression garments at present due to thumb  CMC pain.   Pt  continues to benefit from skilled OT for Lymphedema care to reduce constipation and digestive discomfort. Next session continue teaching simple self MLD and encourage family visit to clinic to learn MLD techniques. Cont  1 x weekly as per POC.  11/15/22 Initial Evaluation: Philip Eckersley is a 53 y.o. female presenting with very mild, stage II, LLE lymphedema 2/2 venous insufficiency and obesity. L Leg swelling and associated pain swelling fluctuates. It has progressed over time, and now no longer resolves with elevation over night. RLE swelling limits balance during functional ambulation. It exacerbates infection and falls risk. LLE/RLQ lymphedema interferes with functional  performance in all occupational domains, including basic and instrumental ADLs, productive activities, leisure pursuits and social participation. Pt will benefit from Occupational Therapy for Complete Decongestive Therapy (CDT) to restore function, to reduce physical and psychologic suffering associated with chronic, progressive lymphedema and associated pain, and to limit infection. CDT will include manual lymphatic drainage (MLD), skin care, therapeutic exercise and compression wraps and garment's devices. Without skilled OT for lymphedema care the condition will worsen and further functional decline is expected  Custom-made gradient compression garments and HOS devices are medically  necessary because they are uniquely sized and shaped to fit the exact dimensions of the affected extremities, and to provide appropriate medical grade, graduated compression essential for optimally managing chronic, progressive lymphedema. Multiple custom compression garments are needed to ensure proper hygiene to limit infection risk. Custom compression garments should be replaced q 3-6 months When worn consistently for optimal lipo-lymphedema self-management over time. HOS devices, medically necessary to limit fibrosis buildup in tissue, should be replaced q 2 years and PRN when worn out.      OBJECTIVE IMPAIRMENTS: decreased activity tolerance, decreased knowledge of condition, decreased knowledge of use of DME, increased edema, impaired sensation, pain, and chronic, progressive leg swelling.   ACTIVITY LIMITATIONS: dependent sitting, extended standing and/ or walking, squatting, and lower body dressing, fitting preferred street shoes  PARTICIPATION LIMITATIONS: shopping, community activity, occupation, and yard work  PERSONAL FACTORS: 3+ comorbidities: Hx DVT,  OSA, CVI. Varicose veins are also affecting patient's functional outcome.   REHAB POTENTIAL: Good  EVALUATION COMPLEXITY: Moderate   GOALS: Goals reviewed with patient? Yes  SHORT TERM GOALS: Target date: 4th OT Rx visit  SHORT TERM GOALS: Target date: 4th OT Rx visit   Pt will demonstrate understanding of lymphedema precautions and prevention strategies with modified independence using a printed reference to identify at least 5 precautions and discussing how s/he may implement them into daily life to reduce risk of progression with extra time. Baseline:Max A Goal status: GOAL MET   Pt will be able to apply multilayer, knee length, gradient, compression wraps to one leg at a time with modified assistance (extra time and assistive device/s) to decrease limb volume, to limit infection risk, and to limit lymphedema progression.   Baseline: Dependent Goal status: GOAL DISCHARGED. Pt Going to compression garments instead  LONG TERM GOALS: Target date: 02/08/23  Given this patient's Intake score of 67%/100% on the functional outcomes FOTO tool, patient will experience an increase in function of 5 points to improve basic and instrumental ADLs performance, including lymphedema self-care.  Baseline: Max A Goal status:GOAL DISCHARGED.  FOTO TOOL DISCONTINUED AT CLINIC   Given this patient's Intake score of 50% on the Lymphedema Life Impact Scale (LLIS), patient will experience a reduction of at least 5 points in her perceived level of functional impairment resulting from lymphedema to improve functional performance and quality of life (QOL). Baseline: 50% Goal status: PROGRESSING  Pt will achieve at least a 10% volume reduction in B legs to return limb to typical size and shape, to limit infection risk and LE progression, to decrease pain, to improve function. Baseline: Dependent Goal status: PROGRESSING  4.  Pt will obtain proper compression garments/devices and achieve modified independence (extra time + assistive devices) with donning/doffing to optimize limb volume reductions and limit LE  progression over time. Baseline:  Goal status:GOAL MET  5.  During Intensive phase CDT , with modified independence, Pt will achieve at least 85% compliance with all lymphedema self-care home  program components, including daily skin care, compression wraps and /or garments, simple self MLD and lymphatic pumping therex to habituate LE self care protocol  into ADLs for optimal LE self-management over time. Baseline: Dependent Goal status:GOAL MET   PLAN:  OT FREQUENCY: 1 /week  OT DURATION: 12 weeks and PRN  PLANNED INTERVENTIONS: 97110-Therapeutic exercises, 97530- Therapeutic activity, 97535- Self Care, 16109- Manual therapy, Patient/Family education, Manual lymph drainage, Compression bandaging, and fit with appropriate  compression garments  PLAN FOR NEXT SESSION:  Cont MLD as established Pt and family edu for LE self-care  Arnold Bicker, MS, OTR/L, CLT-LANA 06/07/23 12:28 PM

## 2023-06-07 NOTE — Therapy (Signed)
 OUTPATIENT PHYSICAL THERAPY FEMALE PELVIC TREATMENT   Patient Name: Karen [name] MRN: [id number] DOB:03-05-1970, 53 y.o., female Today's Date: 06/07/2023  END OF SESSION:  PT End of Session - 06/07/23 1021     Visit Number 43    Date for PT Re-Evaluation 10/10/23    Authorization Type [employer]    PT Start Time 1018    PT Stop Time 1058    PT Time Calculation (min) 40 min    Activity Tolerance Patient tolerated treatment well    Behavior During Therapy [facility] for tasks assessed/performed                       Past Medical History:  Diagnosis Date   Adenomyosis    Anemia    Arthritis 2013   L knee gets steroid injections    Asthmatic bronchitis 01/31/2017   Back pain    Chronic constipation    Diarrhea    DVT of lower extremity (deep venous thrombosis) (HCC)    Dysmenorrhea    Endometriosis    Esophageal stricture    G6PD deficiency    GERD (gastroesophageal reflux disease)    History of kidney stones    History of uterine fibroid    IBS (irritable bowel syndrome)    Interstitial cystitis    Lactose intolerance    Migraine    with aura   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   PONV (postoperative nausea and vomiting)    Recurrent upper respiratory infection (URI)    Sebaceous cyst of breast, right lower inner quadrant 10/25/2012   Excised 11/21/12    Sleep apnea    Syncope, non cardiac    Past Surgical History:  Procedure Laterality Date   ARTHROSCOPIC HAGLUNDS REPAIR     BREAST CYST EXCISION Right 11/21/2012   Procedure: CYST EXCISION BREAST;  Surgeon: Darcella Earnest, MD;  Location: [facility];  Service: General;  Laterality: Right;   BREAST CYST EXCISION Bilateral 01/18/2022   Procedure: EXCISION OF SEBACEOUS CYST BILATERAL BREAST;  Surgeon: Sim Dryer, MD;  Location: [facility];  Service: General;  Laterality: Bilateral;   BREAST EXCISIONAL BIOPSY Right 11/2012   CESAREAN SECTION  04/19/1993    CHOLECYSTECTOMY     COLONOSCOPY  05/02/2011   Procedure: COLONOSCOPY;  Surgeon: Venson Ginger., MD;  Location: [facility];  Service: Endoscopy;  Laterality: N/A;   colonoscopy  11/04/2014   DENTAL SURGERY  03/06/2018   2 surgeries ( 03/06/2018 and 04/06/2018)   to remove two separate benign tumors   ESOPHAGEAL MANOMETRY N/A 11/04/2015   Procedure: ESOPHAGEAL MANOMETRY (EM);  Surgeon: Jolinda Necessary, MD;  Location: [facility];  Service: Endoscopy;  Laterality: N/A;   ESOPHAGOGASTRODUODENOSCOPY  05/02/2011   Procedure: ESOPHAGOGASTRODUODENOSCOPY (EGD);  Surgeon: Venson Ginger., MD;  Location: [facility];  Service: Endoscopy;  Laterality: N/A;   ESOPHAGOGASTRODUODENOSCOPY N/A 09/01/2014   Procedure: ESOPHAGOGASTRODUODENOSCOPY (EGD);  Surgeon: Jolinda Necessary, MD;  Location: [facility];  Service: Endoscopy;  Laterality: N/A;   KNEE SURGERY     LAPAROSCOPY     x 4   LESION REMOVAL N/A 01/18/2022   Procedure: EXCISION OF SEBACEOUS CYSTS CHEST AND NECK;  Surgeon: Sim Dryer, MD;  Location: [facility];  Service: General;  Laterality: N/A;   PH IMPEDANCE STUDY N/A 11/04/2015   Procedure: PH IMPEDANCE STUDY;  Surgeon: Jolinda Necessary, MD;  Location: [facility];  Service: Endoscopy;  Laterality: N/A;   VAGINAL HYSTERECTOMY  2010   TVH--ovaries remain   Patient Active Problem List   Diagnosis Date Noted   Sjogren syndrome with keratoconjunctivitis (HCC) 12/09/2022   Other hemochromatosis 06/10/2021   Elevated LFTs 04/04/2021   Colitis 04/04/2021   Bacterial overgrowth syndrome 05/21/2020   Obesity 05/21/2020   History of endometriosis 05/21/2020   Constipation due to outlet dysfunction 02/05/2020   Gastroesophageal reflux disease 02/05/2020   Obstructive sleep apnea treated with continuous positive airway pressure (CPAP) 06/16/2017   Perennial allergic rhinitis with a nonallergic component 01/31/2017   Asthmatic bronchitis 01/31/2017   GI symptoms 01/31/2017    Family history of colon cancer 01/27/2015   Chest pain 12/13/2012   Dyspnea 12/13/2012   DVT of lower extremity (deep venous thrombosis) (HCC) 01/03/1990    PCP: Arva Lathe, MD  REFERRING PROVIDER: Lillian Rein, MD   REFERRING DIAG: M62.89 (ICD-10-CM) - Pelvic floor dysfunction  THERAPY DIAG:  Pelvic pain  Other low back pain  Muscle weakness (generalized)  Other muscle spasm  Unspecified lack of coordination  Rationale for Evaluation and Treatment: Rehabilitation  ONSET DATE: chronic  SUBJECTIVE:                                                                                                                                                                                           SUBJECTIVE STATEMENT:  Pt has had colonoscopy and endoscopy that were both normal. She had MRI on Rt wrist and surgery not recommended at this time. She had steroid injection into Rt wrist which has helped. She started therapy for her hand/wrist yesterday. She has been to see rheumatologist and was diagnosed with RA in hopes of getting inflammation down and into remission.   She has not had to take Miralax  since January. She did a cleanse on 05/24/23 to prepare for colonoscopy. After cleanse she could tell that she was cleaned out and felt better. She had her first bowel movement 3 days after cleanse. She has had 2-3 bowel movements a day since.    PAIN: 06/07/23 Are you having pain? Yes NPRS scale: 3/10 Pain location: Lower abdominal pain, Lt sided pain, low back pain  Pain type: turning, knotted, pressure Pain description: intermittent and constant   Aggravating factors: constipation or frequent bowel movements/constantly having bowel movements  Relieving factors: exercises (bowel massage, happy baby, spinal twists, walking)  PRECAUTIONS: None  WEIGHT BEARING RESTRICTIONS: No  FALLS:  Has patient fallen in last 6 months? No  LIVING ENVIRONMENT: Lives with: lives with their  spouse Lives in: House/apartment  OCCUPATION: nurse, in admin  PLOF: Independent  PATIENT GOALS: decrease pain and have more normal  bowel movements  PERTINENT HISTORY:  Vaginal hysterectomy, DVT LE, endometriosis, interstitial cystitis, exploratory laps for endometriosis, c-section, cholecystectomy, RA diagnosed 06/2023 Sexual abuse: No  BOWEL MOVEMENT: Pain with bowel movement: Yes Type of bowel movement:Type (Bristol Stool Scale) 1-7 (sometimes full range in the same bowel movement), Frequency sometimes many times a day, sometimes up to 4 days in between, and Strain Yes Fully empty rectum: No Leakage: No Pads: No Fiber supplement: No - has attempted metamucil and it made constipation worse  URINATION: Pain with urination: Yes Fully empty bladder: No Stream: varies  Urgency: Yes: all the time Frequency: multiple times an hours  Leakage: Urge to void, Walking to the bathroom, Coughing, Sneezing, Laughing, and Exercise Pads: Yes: daily  INTERCOURSE: Pain with intercourse: Initial Penetration and During Penetration Ability to have vaginal penetration:  Yes: with pain Climax: non painful WNL   PREGNANCY: Vaginal deliveries 1 Tearing Yes: 3rd degree tear C-section deliveries 1 Currently pregnant No  PROLAPSE: Periodically will feel vaginal bulging, heaviness in lower abdomen   OBJECTIVE:  05/04/23:               Internal Pelvic Floor: mild tenderness in bil levator ani, but no increase in tension; with internal rectal exam, she was demonstrating paradoxical contraction when trying to eccentrically lengthen with exhale and had very hard time correcting  Patient confirms identification and approves PT to assess internal pelvic floor and treatment Yes  PELVIC MMT:   MMT eval  Vaginal 2/5, 3 second hold, 3 repeat contractions  Internal Anal Sphincter 1/5  External Anal Sphincter 2/5  Puborectalis 1/5  Diastasis Recti   (Blank rows = not tested)        TONE:  WNL   PROLAPSE: Grade 2 anterior vaginal wall laxity  04/25/23: Curl-up test: abdominal distortion Transversus abdominus: week, difficulty getting active contraction (not been working on strengthening)    LUMBARAROM/PROM:  A/PROM A/PROM  Eval (% available) 11/09/22 (% available) 04/25/23 (% available)  Flexion 50 90 90  Extension 25, pain anteriorly  25, pain in low back 50  Right lateral flexion 50, pain anteriorly  75, pain on right 50, pain on Rt  Left lateral flexion 50, pain anteriorly  75, pain on right 75   (Blank rows = not tested)  11/09/22: No internal rectal or vaginal pelvic floor exam performed due to high level of skin irritation   Weak transversus abdominus contraction  Abdominal restriction and tenderness in bil lower quadrant  Unable to perform pelvic tilt with appropriate coordination  LUMBARAROM/PROM:  A/PROM A/PROM  Eval (% available) 11/09/22 (% available)  Flexion 50 90  Extension 25, pain anteriorly  25, pain in low back  Right lateral flexion 50, pain anteriorly  75, pain on right  Left lateral flexion 50, pain anteriorly  75, pain on right   (Blank rows = not tested) 07/21/22: standing prolapse assessment demonstrated grade 2 anterior vaginal wall laxity   06/08/22:               External Perineal Exam: WNL                              Internal Pelvic Floor: burning reported with palpation of superficial muscles; deep aching/pressure with palpation of Lt levator ani   Patient confirms identification and approves PT to assess internal pelvic floor and treatment Yes  PELVIC MMT:   MMT eval  Vaginal 3/5  Internal Anal  Sphincter 1/5  External Anal Sphincter 2/5  Puborectalis 1/5  Diastasis Recti   (Blank rows = not tested)        TONE: High in Lt levator ani   PROLAPSE: Not able to tell this session due to dyssynergic pelvic floor contraction     06/01/22: COGNITION: Overall cognitive status: Within functional limits for tasks  assessed     SENSATION: Light touch: Appears intact Proprioception: Appears intact  GAIT: Comments: decreased hip extension, forward flexed trunk  POSTURE: rounded shoulders, forward head, decreased lumbar lordosis, increased thoracic kyphosis, posterior pelvic tilt, and flexed trunk    LUMBARAROM/PROM:  A/PROM A/PROM  Eval (% available)  Flexion 50  Extension 25, pain anteriorly   Right lateral flexion 50, pain anteriorly   Left lateral flexion 50, pain anteriorly    (Blank rows = not tested)   PALPATION:   General  Significant abdominal restriction and tenderness; decreased rib mobility with mobilization and breathing    TODAY'S TREATMENT:                                                                                                                              DATE:  06/07/23 Manual: ILU bowel mobilization  Abdominal scar tissue mobilization Ileocecal valve mobilization External rectum mobilization Therapeutic activities: Education on normal motility and that based on post-cleanse she demonstrates normal based on time frame Education on consistency of bowel movements remaining at 4-5 on bristol stool scale, continuing to have 2-3 bowel movements a day, and sensation of complete emptying or she needs to talk with doctor about returning to laxative program/cleanse in order to allow for colon/rectum healing   05/22/23 Manual: ILU bowel mobilization  Abdominal scar tissue mobilization Negative pressure soft tissue mobilization to low back and abdomen Quadratus lumborum and psoas release Neuromuscular re-education: Standing leg swings 20x bil Postural correction in standing due to stnading with knees locked out, increased anterior pelvic tilt and posterior rib rib; she performed pelvic tilts in standing to help soften and improve awareness of rib cage stacked over pelvis for better pelvic floor muscle function  05/16/23 Manual: ILU bowel mobilization  Abdominal scar  tissue mobilization Therapeutic activities: Education on MOP protocol and impact of chronic constipation  Being consistent with a bowel regimen to allow healing to take place    PATIENT EDUCATION:  Education details: See above Person educated: Patient Education method: Programmer, multimedia, Demonstration, Tactile cues, Verbal cues, and Handouts Education comprehension: verbalized understanding  HOME EXERCISE PROGRAM: [id number]  ASSESSMENT:  CLINICAL IMPRESSION: Pt seems to be doing very well since her cleanse 2 weeks ago with regular bowel movements at a good consistency. We went over that 3 days after cleanse is perfectly appropriate to have her firs tbowel movement and indicates good motility; she could also indicate that her constipation is more outlet obstruction and have to do with decreased rectal sensation/stretch relfex. We discussed that if bowel movements start to change and be less  productive, we quickly need to talk with MD about getting her cleaned out again and on regular laxative program with guided titration before she develops large stool burden again. She was taught new self-manual technique of ileocecal valve mobilization to help stimulate bowel movements and we also performed rectal mobilization together with good tolerance in addition to previous manual techniques that have been helpful. She will continue to benefit from skilled PT intervention in order to decrease pain, improve bowel movements, and decrease urinary urgency/incontinence.     OBJECTIVE IMPAIRMENTS: decreased activity tolerance, decreased coordination, decreased endurance, decreased mobility, decreased strength, increased fascial restrictions, increased muscle spasms, impaired tone, postural dysfunction, and pain.   ACTIVITY LIMITATIONS: lifting, bending, continence, and locomotion level  PARTICIPATION LIMITATIONS: interpersonal relationship, community activity, and occupation  PERSONAL FACTORS: 1 comorbidity:  medical history are also affecting patient's functional outcome.   REHAB POTENTIAL: Good  CLINICAL DECISION MAKING: Stable/uncomplicated  EVALUATION COMPLEXITY: Low   GOALS: Goals reviewed with patient? Yes  SHORT TERM GOALS: Updated 06/07/23  Pt will be independent with HEP.   Baseline: Goal status: MET 07/12/22  2.  Pt will be independent with diaphragmatic breathing and down training activities in order to improve pelvic floor relaxation.  Baseline:  Goal status: MET 07/12/22  3.  Pt will be independent with the knack, urge suppression technique, and double voiding in order to improve bladder habits and decrease urinary incontinence.   Baseline:  Goal status: MET 09/22/22  4.  Pt will be independent with use of squatty potty, relaxed toileting mechanics, and improved bowel movement techniques in order to increase ease of bowel movements and complete evacuation.   Baseline:  Goal status: MET 11/09/22  5.  Pt will be able to correctly perform diaphragmatic breathing and appropriate pressure management in order to prevent worsening vaginal wall laxity and improve pelvic floor A/ROM.   Baseline:  Goal status: MET 01/18/23  LONG TERM GOALS: Updated 06/07/23  Pt will be independent with advanced HEP.   Baseline:  Goal status: IN PROGRESS 06/07/23  2.  Pt will demonstrate normal pelvic floor muscle tone and A/ROM, able to achieve 4/5 strength with contractions and 10 sec endurance, in order to provide appropriate lumbopelvic support in functional activities.   Baseline: not assessed today per patient request  Goal status: IN PROGRESS 06/07/23  3.  Pt will increase all impaired lumbar A/ROM by 25% without pain.  Baseline: Pt seen a little bit of loss in some and improvement in extension Goal status:  IN PROGRESS 06/07/23  4.  Pt will report pain no higher than 3/10 with any activity. Baseline: pain has gotten up to 5/10, but currently at a 3/10 Goal status:  IN PROGRESS  06/07/23  5.  Pt will report 0/10 pain with vaginal penetration in order to improve intimate relationship with partner.    Baseline: no recent intercourse attempts  Goal status:  IN PROGRESS 06/07/23  6.  Pt will be able to go 2-3 hours in between voids without urgency or incontinence in order to improve QOL and perform all functional activities with less difficulty.   Baseline:  Goal status: MET 02/21/23  7.  Pt will report no leaks with laughing, coughing, sneezing in order to improve comfort with interpersonal relationships and community activities.   Baseline: Pt states that she feels 30-40% better Goal status:  IN PROGRESS 06/07/23  8.  Pt will have consistent bowel movements 4-5x/week without straining or pain.  Baseline:  Goal status:  MET  02/21/23  PLAN:  PT FREQUENCY: 1-2x/week  PT DURATION: 6 months  PLANNED INTERVENTIONS: Therapeutic exercises, Therapeutic activity, Neuromuscular re-education, Balance training, Gait training, Patient/Family education, Self Care, Joint mobilization, Dry Needling, Biofeedback, and Manual therapy  PLAN FOR NEXT SESSION: strengthening program - reinforce importance and go over some exercises each session; internal pelvic floor muscle exam  Verlena Glenn, PT, DPT06/04/2509:26 AM

## 2023-06-08 NOTE — Telephone Encounter (Signed)
 Pt called back to ask about status of short-term disability paperwork and updated diagnoses. I advised would work with Dr Idolina Maker during late shift today and would update her, she thanked

## 2023-06-12 ENCOUNTER — Encounter: Payer: Self-pay | Admitting: Occupational Therapy

## 2023-06-12 ENCOUNTER — Ambulatory Visit: Admitting: Occupational Therapy

## 2023-06-12 DIAGNOSIS — I890 Lymphedema, not elsewhere classified: Secondary | ICD-10-CM | POA: Diagnosis not present

## 2023-06-12 NOTE — Therapy (Signed)
 OUTPATIENT OCCUPATIONAL THERAPY TREATMENT NOTE   BLE/ BLQ LYMPHEDEMA  Patient Name: Karen [name] MRN: [id number] DOB:21-Mar-1970, 53 y.o., female Today's Date: 06/12/2023  END OF SESSION:   OT End of Session - 06/12/23 1446     Visit Number 44    Number of Visits 72    Date for OT Re-Evaluation 08/17/23    OT Start Time 0100    OT Stop Time 0200    OT Time Calculation (min) 60 min    Activity Tolerance Patient tolerated treatment well;No increased pain    Behavior During Therapy [facility] for tasks assessed/performed               Past Medical History:  Diagnosis Date   Adenomyosis    Anemia    Arthritis 2013   L knee gets steroid injections    Asthmatic bronchitis 01/31/2017   Back pain    Chronic constipation    Diarrhea    DVT of lower extremity (deep venous thrombosis) (HCC)    Dysmenorrhea    Endometriosis    Esophageal stricture    G6PD deficiency    GERD (gastroesophageal reflux disease)    History of kidney stones    History of uterine fibroid    IBS (irritable bowel syndrome)    Interstitial cystitis    Lactose intolerance    Migraine    with aura   Other hemochromatosis 06/10/2021   PONV (postoperative nausea and vomiting)    Recurrent upper respiratory infection (URI)    Sebaceous cyst of breast, right lower inner quadrant 10/25/2012   Excised 11/21/12    Sleep apnea    Syncope, non cardiac    Past Surgical History:  Procedure Laterality Date   ARTHROSCOPIC HAGLUNDS REPAIR     BREAST CYST EXCISION Right 11/21/2012   Procedure: CYST EXCISION BREAST;  Surgeon: Darcella Earnest, MD;  Location: [facility];  Service: General;  Laterality: Right;   BREAST CYST EXCISION Bilateral 01/18/2022   Procedure: EXCISION OF SEBACEOUS CYST BILATERAL BREAST;  Surgeon: Sim Dryer, MD;  Location: [facility];  Service: General;  Laterality: Bilateral;   BREAST EXCISIONAL BIOPSY Right 11/2012   CESAREAN SECTION  04/19/1993    CHOLECYSTECTOMY     COLONOSCOPY  05/02/2011   Procedure: COLONOSCOPY;  Surgeon: Venson Ginger., MD;  Location: [facility];  Service: Endoscopy;  Laterality: N/A;   colonoscopy  11/04/2014   DENTAL SURGERY  03/06/2018   2 surgeries ( 03/06/2018 and 04/06/2018)   to remove two separate benign tumors   ESOPHAGEAL MANOMETRY N/A 11/04/2015   Procedure: ESOPHAGEAL MANOMETRY (EM);  Surgeon: Jolinda Necessary, MD;  Location: [facility];  Service: Endoscopy;  Laterality: N/A;   ESOPHAGOGASTRODUODENOSCOPY  05/02/2011   Procedure: ESOPHAGOGASTRODUODENOSCOPY (EGD);  Surgeon: Venson Ginger., MD;  Location: [facility];  Service: Endoscopy;  Laterality: N/A;   ESOPHAGOGASTRODUODENOSCOPY N/A 09/01/2014   Procedure: ESOPHAGOGASTRODUODENOSCOPY (EGD);  Surgeon: Jolinda Necessary, MD;  Location: [facility];  Service: Endoscopy;  Laterality: N/A;   KNEE SURGERY     LAPAROSCOPY     x 4   LESION REMOVAL N/A 01/18/2022   Procedure: EXCISION OF SEBACEOUS CYSTS CHEST AND NECK;  Surgeon: Sim Dryer, MD;  Location: [facility];  Service: General;  Laterality: N/A;   PH IMPEDANCE STUDY N/A 11/04/2015   Procedure: PH IMPEDANCE STUDY;  Surgeon: Jolinda Necessary, MD;  Location: [facility];  Service: Endoscopy;  Laterality: N/A;   VAGINAL HYSTERECTOMY  2010  TVH--ovaries remain   Patient Active Problem List   Diagnosis Date Noted   Sjogren syndrome with keratoconjunctivitis (HCC) 12/09/2022   Other hemochromatosis 06/10/2021   Elevated LFTs 04/04/2021   Colitis 04/04/2021   Bacterial overgrowth syndrome 05/21/2020   Obesity 05/21/2020   History of endometriosis 05/21/2020   Constipation due to outlet dysfunction 02/05/2020   Gastroesophageal reflux disease 02/05/2020   Obstructive sleep apnea treated with continuous positive airway pressure (CPAP) 06/16/2017   Perennial allergic rhinitis with a nonallergic component 01/31/2017   Asthmatic bronchitis 01/31/2017   GI symptoms 01/31/2017    Family history of colon cancer 01/27/2015   Chest pain 12/13/2012   Dyspnea 12/13/2012   DVT of lower extremity (deep venous thrombosis) (HCC) 01/03/1990    PCP: Genny Kid, MD  REFERRING PROVIDER: Arva Lathe, MD  REFERRING DIAG: I89.0  THERAPY DIAG:  Lymphedema, not elsewhere classified  Rationale for Evaluation and Treatment: Rehabilitation  ONSET DATE: ~2014; exacerbation in 2023  SUBJECTIVE:                                                                                                                                                                                          SUBJECTIVE STATEMENT:  Pt presents for OT to address lower quadrant and LLE lymphedema and associated pain/ discomfort. Pt reports recent colonoscopy and endoscopy were both negative. Pt reports she received a dx of RA during the interval. Pt continues to wear R long thumb spica splint and reports wrist pain 4/10. Pt reports digestive discomfort/ pain  2/10. Karen [name]   Pt and OT discussed plan going forward. Pt reports ongoing symptom relief,  including decreased gut and abdominal discomfort and improved bowel motility / peristalsis with abdominal MLD. OT and Pt have discussed teaching adult daughter these techniques, but this has not been scheduled yet.  PERTINENT HISTORY: CVI, OSA (no CPAP), GERD, Esophageal stricture, IBS, Lactose intolerant, Diarrhea, Hx LE DVT  PAIN:  Are you having pain? Yes: 2/10  Pain location: LLE, digestive discomfort Pain description: myalgia, tight, heavy, tingling, numbness Aggravating factors: standing, walking, dependent sitting > 30 minutes Relieving factors: elevation  PRECAUTIONS: Other: LYMPHEDEMA; Hx LLE DVT  WEIGHT BEARING RESTRICTIONS: No  FALLS:  Has patient fallen in last 6 months? No  LIVING ENVIRONMENT: Lives with: lives with spouse and daughter Lives in: House/apartment Stairs: Yes; Internal: 14 steps; yes and External: 4 steps; yes Has following  equipment at home: None  OCCUPATION: [employer] Services operational officer, walking, desk work  LEISURE: family time  HAND DOMINANCE: right   PRIOR LEVEL OF FUNCTION: Independent  PATIENT GOALS: Feel better, be able to be more active without  pain, wear preferred street shoes  OBJECTIVE: Note: Objective measures were completed at Evaluation unless otherwise noted.  COGNITION:  Overall cognitive status: Within functional limits for tasks assessed   POSTURE: WFL  LE ROM: WFL  LE MMT: WFL  LYMPHEDEMA ASSESSMENTS: non-Ca related  INFECTIONS: denies cellulitis and wound hx  BLE COMPARATIVE LIMB VOLUMETRICS Initial 11/15/22  LANDMARK RIGHT (dominant)  R LEG (A-D) 3028.9 ml  R THIGH (E-G) 4809.60 ml  R FULL LIMB (A-G) 7838.5 ml  Limb Volume differential (LVD)  %  Volume change since initial %  Volume change overall V  (Blank rows = not tested)  LANDMARK LEFT  (Rx)  L LEG (A-D) 3189.9 ml  L THIGH (E-G) 4959.8 ml  L FULL LIMB (A-G) 8149.7 ml  Limb Volume differential (LVD)  LEG LVD = 5.32%, L>R; THIGH LVD = 3.12%, L>R; And full LLE LVD = 3.97%, L>R.   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  Skin  Description Hyper-Keratosis Peau d' Orange Shiny Tight Fibrotic/ Indurated Fatty Doughy Spongy/ boggy       R>L x  x   Skin dry Flaky WNL Macerated   mildly      Color Redness Varicosities Blanching Hemosiderin Stain Mottled        x   Odor Malodorous Yeast Fungal infection  WNL      x   Temperature Warm Cool wnl     x    Pitting Edema   1+ 2+ 3+ 4+ Non-pitting         x    Girth Symmetrical Asymmetrical                   Distribution    L>R toes to groin    Stemmer Sign Positive Negative   +L -R   Lymphorrhea History Of:  Present Absent     x    Wounds History Of Present Absent Venous Arterial Pressure Sheer     x        Signs of Infection Redness Warmth Erythema Acute Swelling Drainage Borders                    Sensation Light Touch Deep pressure Hypersensitivity   In tact Impaired In tact Impaired Absent Impaired   x  x  x     Nails WNL   Fungus nail dystrophy   x     Hair Growth Symmetrical Asymmetrical   x    Skin Creases Base of toes  Ankles   Base of Fingers knees       Abdominal pannus Thigh Lobules  Face/neck   x           GAIT: Distance walked: >500' Assistive device utilized: None Level of assistance: Complete Independence Comments: Functional ambulation and transfers [facility]  LYMPHEDEMA LIFE IMPACT SCALE (LLIS): Initial 11/15/22  50%  FOTO functional outcome measure: Deferred. FOTO discontinued.  TODAY'S TREATMENT:  MLD to BLQ emphasis on colon and LLE Pt edu for le SELF CARE  PATIENT EDUCATION:  Continued Pt/ CG edu for how function of cisterna chyli, part of the thoracic duct of deep abdominal lymphatics, assists with digestion by filtering many of the fats and proteins from the digestive system. Sub optimal lymphatic flow in this region may result in constipation, bloating, swelling, etc. MLD helps mobilize these protein and fats , and the rhythmic movement of manual lymphatic drainage stimulates digestive muscles and increase peristalsis. Provided printed resource for reference.  Topics include outcome of comparative limb volumetrics- starting limb volume differentials (LVDs), technology and gradient techniques used for short stretch, multilayer compression wrapping, simple  self-MLD, therapeutic lymphatic pumping exercises, skin/nail care, LE precautions,. compression garment recommendations and specifications, wear and care schedule and compression garment donning / doffing w assistive devices. Discussed progress towards all OT goals since commencing CDT. All questions answered to the Pt's satisfaction. Good return. Person educated: Patient  Education method: Explanation, Demonstration, and Handouts Education comprehension: verbalized understanding, returned demonstration, verbal cues required, and needs further education  HOME EXERCISE PROGRAM: BLE lymphatic pumping there ex using- 1 sets of 10 reps, each exercise in order-  1-2 x daily, bilaterally Simple self MLD 1 x daily Daily skin care to increase hydration, skin mobility and decrease infection risk- can be done during MLD Compression wraps 23/7 during intensive phase of CDT Compression garments/ devices during self-management phase of CDT  ASSESSMENT:  CLINICAL IMPRESSION:    Pt continues to tolerate and respond well to MLD accessing deep abdominal lymphatics and the ascending, transverse and descending colon as evidenced by reduced gut discomfort and constipation. MLD to abdominal lymphatics is known to increase peristalsis and to stimulate the parasympathetic nervous system promoting relaxation of the intestinal sphincter muscles. Pt has also been able to suspend frequent intestinal cleanses, reports decreased constipation and more regular bowel movements with regular MLD. Pt reports recently when she missed a week of OT these symptoms worsened. Pt tolerated abdominal MLD to deep gut  lymphatics without increased pain.  Performed short neck sequence as established, then performed alternate pressure and release on descending, transverse and ascending colon combined with diaphragmatic breathing in supine with knees flexed. We completed J strokes starting at distal end of colon (descending), then transverse and  finally ascending colon region.   I lieu of new RA dx issued new L Comfort Cool soft neoprene thumb CMC support and encouraged Pt to use this when performing repetitive tasks with force. Reviewed ergonomic positioning at home and at work.  Pt  continues to benefit from skilled OT for Lymphedema care to reduce constipation and digestive discomfort. Next session continue teaching simple self MLD and encourage family visit to clinic to learn MLD techniques. Cont  1 x weekly as per POC.  11/15/22 Initial Evaluation: Karen [name] is a 53 y.o. female presenting with very mild, stage II, LLE lymphedema 2/2 venous insufficiency and obesity. L Leg swelling and associated pain swelling fluctuates. It has progressed over time, and now no longer resolves with elevation over night. RLE swelling limits balance during functional ambulation. It exacerbates infection and falls risk. LLE/RLQ lymphedema interferes with functional performance in all occupational domains, including basic and instrumental ADLs, productive activities, leisure pursuits and social participation. Pt will benefit from Occupational Therapy for Complete Decongestive Therapy (CDT) to restore function, to reduce physical and psychologic suffering associated with chronic, progressive lymphedema and associated pain, and to limit infection. CDT will include manual  lymphatic drainage (MLD), skin care, therapeutic exercise and compression wraps and garment's devices. Without skilled OT for lymphedema care the condition will worsen and further functional decline is expected  Custom-made gradient compression garments and HOS devices are medically necessary because they are uniquely sized and shaped to fit the exact dimensions of the affected extremities, and to provide appropriate medical grade, graduated compression essential for optimally managing chronic, progressive lymphedema. Multiple custom compression garments are needed to ensure proper hygiene to limit  infection risk. Custom compression garments should be replaced q 3-6 months When worn consistently for optimal lipo-lymphedema self-management over time. HOS devices, medically necessary to limit fibrosis buildup in tissue, should be replaced q 2 years and PRN when worn out.      OBJECTIVE IMPAIRMENTS: decreased activity tolerance, decreased knowledge of condition, decreased knowledge of use of DME, increased edema, impaired sensation, pain, and chronic, progressive leg swelling.   ACTIVITY LIMITATIONS: dependent sitting, extended standing and/ or walking, squatting, and lower body dressing, fitting preferred street shoes  PARTICIPATION LIMITATIONS: shopping, community activity, occupation, and yard work  PERSONAL FACTORS: 3+ comorbidities: Hx DVT,  OSA, CVI. Varicose veins are also affecting patient's functional outcome.   REHAB POTENTIAL: Good  EVALUATION COMPLEXITY: Moderate   GOALS: Goals reviewed with patient? Yes  SHORT TERM GOALS: Target date: 4th OT Rx visit  SHORT TERM GOALS: Target date: 4th OT Rx visit   Pt will demonstrate understanding of lymphedema precautions and prevention strategies with modified independence using a printed reference to identify at least 5 precautions and discussing how s/he may implement them into daily life to reduce risk of progression with extra time. Baseline:Max A Goal status: GOAL MET   Pt will be able to apply multilayer, knee length, gradient, compression wraps to one leg at a time with modified assistance (extra time and assistive device/s) to decrease limb volume, to limit infection risk, and to limit lymphedema progression.  Baseline: Dependent Goal status: GOAL DISCHARGED. Pt Going to compression garments instead  LONG TERM GOALS: Target date: 02/08/23  Given this patient's Intake score of 67%/100% on the functional outcomes FOTO tool, patient will experience an increase in function of 5 points to improve basic and instrumental ADLs  performance, including lymphedema self-care.  Baseline: Max A Goal status:GOAL DISCHARGED.  FOTO TOOL DISCONTINUED AT CLINIC   Given this patient's Intake score of 50% on the Lymphedema Life Impact Scale (LLIS), patient will experience a reduction of at least 5 points in her perceived level of functional impairment resulting from lymphedema to improve functional performance and quality of life (QOL). Baseline: 50% Goal status: PROGRESSING  Pt will achieve at least a 10% volume reduction in B legs to return limb to typical size and shape, to limit infection risk and LE progression, to decrease pain, to improve function. Baseline: Dependent Goal status: PROGRESSING  4.  Pt will obtain proper compression garments/devices and achieve modified independence (extra time + assistive devices) with donning/doffing to optimize limb volume reductions and limit LE  progression over time. Baseline:  Goal status:GOAL MET  5.  During Intensive phase CDT , with modified independence, Pt will achieve at least 85% compliance with all lymphedema self-care home program components, including daily skin care, compression wraps and /or garments, simple self MLD and lymphatic pumping therex to habituate LE self care protocol  into ADLs for optimal LE self-management over time. Baseline: Dependent Goal status:GOAL MET   PLAN:  OT FREQUENCY: 1 /week  OT DURATION: 12 weeks and PRN  PLANNED INTERVENTIONS: 97110-Therapeutic exercises, 97530- Therapeutic activity, 97535- Self Care, [id number]- Manual therapy, Patient/Family education, Manual lymph drainage, Compression bandaging, and fit with appropriate compression garments  PLAN FOR NEXT SESSION:  Cont MLD as established Pt and family edu for LE self-care  Arnold Bicker, MS, OTR/L, CLT-LANA 06/12/23 2:48 PM

## 2023-06-13 ENCOUNTER — Other Ambulatory Visit (HOSPITAL_COMMUNITY): Payer: Self-pay

## 2023-06-13 MED ORDER — FOLIC ACID 1 MG PO TABS: 2.0000 mg | ORAL_TABLET | Freq: Every day | ORAL | 3 refills | Status: DC

## 2023-06-14 DIAGNOSIS — M25531 Pain in right wrist: Secondary | ICD-10-CM | POA: Diagnosis not present

## 2023-06-14 DIAGNOSIS — M79641 Pain in right hand: Secondary | ICD-10-CM | POA: Diagnosis not present

## 2023-06-15 NOTE — Progress Notes (Signed)
 Subjective:   Patient ID: Karen [name], female   DOB: 53 y.o.   MRN: [id number]   HPI Patient concerned about irritation of the right hallux nail still present that is localized around the big toenail right foot but overall improving   ROS      Objective:  Physical Exam  Neurovascular status intact small amount of crusted tissue formation slight redness in the right hallux nail bed localized     Assessment:  Low-grade paronychia of the right hallux but no other indications of pathology     Plan:  Recommended no treatment currently except for soaks and this should be uneventful if any issues were to occur patient is to let us  know right away may require opening the area if it were to persist

## 2023-06-19 ENCOUNTER — Ambulatory Visit: Admitting: Occupational Therapy

## 2023-06-19 DIAGNOSIS — I890 Lymphedema, not elsewhere classified: Secondary | ICD-10-CM | POA: Diagnosis not present

## 2023-06-19 NOTE — Therapy (Signed)
 OUTPATIENT OCCUPATIONAL THERAPY TREATMENT NOTE   BLE/ BLQ LYMPHEDEMA  Patient Name: Karen [name] MRN: [id number] DOB:[birth year], 53 y.o., female Today's Date: 06/19/2023  END OF SESSION:   OT End of Session - 06/19/23 1307     Visit Number 45    Number of Visits 72    Date for OT Re-Evaluation 08/17/23    OT Start Time 0100    OT Stop Time 0207    OT Time Calculation (min) 67 min    Activity Tolerance Patient tolerated treatment well;No increased pain    Behavior During Therapy [facility] for tasks assessed/performed            Past Medical History:  Diagnosis Date   Adenomyosis    Anemia    Arthritis 2013   L knee gets steroid injections    Asthmatic bronchitis 01/31/2017   Back pain    Chronic constipation    Diarrhea    DVT of lower extremity (deep venous thrombosis) (HCC)    Dysmenorrhea    Endometriosis    Esophageal stricture    G6PD deficiency    GERD (gastroesophageal reflux disease)    History of kidney stones    History of uterine fibroid    IBS (irritable bowel syndrome)    Interstitial cystitis    Lactose intolerance    Migraine    with aura   Other hemochromatosis 06/10/2021   PONV (postoperative nausea and vomiting)    Recurrent upper respiratory infection (URI)    Sebaceous cyst of breast, right lower inner quadrant 10/25/2012   Excised 11/21/12    Sleep apnea    Syncope, non cardiac    Past Surgical History:  Procedure Laterality Date   ARTHROSCOPIC HAGLUNDS REPAIR     BREAST CYST EXCISION Right 11/21/2012   Procedure: CYST EXCISION BREAST;  Surgeon: Darcella Earnest, MD;  Location: [facility];  Service: General;  Laterality: Right;   BREAST CYST EXCISION Bilateral 01/18/2022   Procedure: EXCISION OF SEBACEOUS CYST BILATERAL BREAST;  Surgeon: Sim Dryer, MD;  Location: [facility];  Service: General;  Laterality: Bilateral;   BREAST EXCISIONAL BIOPSY Right 11/2012   CESAREAN SECTION  04/19/1993    CHOLECYSTECTOMY     COLONOSCOPY  05/02/2011   Procedure: COLONOSCOPY;  Surgeon: Venson Ginger., MD;  Location: [facility];  Service: Endoscopy;  Laterality: N/A;   colonoscopy  11/04/2014   DENTAL SURGERY  03/06/2018   2 surgeries ( 03/06/2018 and 04/06/2018)   to remove two separate benign tumors   ESOPHAGEAL MANOMETRY N/A 11/04/2015   Procedure: ESOPHAGEAL MANOMETRY (EM);  Surgeon: Jolinda Necessary, MD;  Location: [facility];  Service: Endoscopy;  Laterality: N/A;   ESOPHAGOGASTRODUODENOSCOPY  05/02/2011   Procedure: ESOPHAGOGASTRODUODENOSCOPY (EGD);  Surgeon: Venson Ginger., MD;  Location: [facility];  Service: Endoscopy;  Laterality: N/A;   ESOPHAGOGASTRODUODENOSCOPY N/A 09/01/2014   Procedure: ESOPHAGOGASTRODUODENOSCOPY (EGD);  Surgeon: Jolinda Necessary, MD;  Location: [facility];  Service: Endoscopy;  Laterality: N/A;   KNEE SURGERY     LAPAROSCOPY     x 4   LESION REMOVAL N/A 01/18/2022   Procedure: EXCISION OF SEBACEOUS CYSTS CHEST AND NECK;  Surgeon: Sim Dryer, MD;  Location: [facility];  Service: General;  Laterality: N/A;   PH IMPEDANCE STUDY N/A 11/04/2015   Procedure: PH IMPEDANCE STUDY;  Surgeon: Jolinda Necessary, MD;  Location: [facility];  Service: Endoscopy;  Laterality: N/A;   VAGINAL HYSTERECTOMY  2010   TVH--ovaries remain  Patient Active Problem List   Diagnosis Date Noted   Sjogren syndrome with keratoconjunctivitis (HCC) 12/09/2022   Other hemochromatosis 06/10/2021   Elevated LFTs 04/04/2021   Colitis 04/04/2021   Bacterial overgrowth syndrome 05/21/2020   Obesity 05/21/2020   History of endometriosis 05/21/2020   Constipation due to outlet dysfunction 02/05/2020   Gastroesophageal reflux disease 02/05/2020   Obstructive sleep apnea treated with continuous positive airway pressure (CPAP) 06/16/2017   Perennial allergic rhinitis with a nonallergic component 01/31/2017   Asthmatic bronchitis 01/31/2017   GI symptoms 01/31/2017    Family history of colon cancer 01/27/2015   Chest pain 12/13/2012   Dyspnea 12/13/2012   DVT of lower extremity (deep venous thrombosis) (HCC) 01/03/1990    PCP: Genny Kid, MD  REFERRING PROVIDER: Arva Lathe, MD  REFERRING DIAG: I89.0  THERAPY DIAG:  Lymphedema, not elsewhere classified  Rationale for Evaluation and Treatment: Rehabilitation  ONSET DATE: ~2014; exacerbation in 2023  SUBJECTIVE:                                                                                                                                                                                          SUBJECTIVE STATEMENT:  Pt presents for OT to address lower quadrant and LLE lymphedema and associated pain/ discomfort. Pt reports recent colonoscopy and endoscopy were both negative. Pt reports she received a dx of RA during the interval. Pt continues to wear R long thumb spica splint and reports wrist pain 4/10. Pt reports digestive discomfort/ pain  2/10. Karen [name]   Pt and OT discussed plan going forward. Pt reports ongoing symptom relief,  including decreased gut and abdominal discomfort and improved bowel motility / peristalsis with abdominal MLD. OT and Pt have discussed teaching adult daughter these techniques, but this has not been scheduled yet.  PERTINENT HISTORY: CVI, OSA (no CPAP), GERD, Esophageal stricture, IBS, Lactose intolerant, Diarrhea, Hx LE DVT  PAIN:  Are you having pain? Yes: 2/10  Pain location: LLE, digestive discomfort Pain description: myalgia, tight, heavy, tingling, numbness Aggravating factors: standing, walking, dependent sitting > 30 minutes Relieving factors: elevation  PRECAUTIONS: Other: LYMPHEDEMA; Hx LLE DVT  WEIGHT BEARING RESTRICTIONS: No  FALLS:  Has patient fallen in last 6 months? No  LIVING ENVIRONMENT: Lives with: lives with spouse and daughter Lives in: House/apartment Stairs: Yes; Internal: 14 steps; yes and External: 4 steps; yes Has following  equipment at home: None  OCCUPATION: Diplomatic Services operational officer, walking, desk work  LEISURE: family time  HAND DOMINANCE: right   PRIOR LEVEL OF FUNCTION: Independent  PATIENT GOALS: Feel better, be able to be more active without pain, wear preferred street  shoes  OBJECTIVE: Note: Objective measures were completed at Evaluation unless otherwise noted.  COGNITION:  Overall cognitive status: Within functional limits for tasks assessed   POSTURE: WFL  LE ROM: WFL  LE MMT: WFL  LYMPHEDEMA ASSESSMENTS: non-Ca related  INFECTIONS: denies cellulitis and wound hx  BLE COMPARATIVE LIMB VOLUMETRICS Initial 11/15/22  LANDMARK RIGHT (dominant)  R LEG (A-D) 3028.9 ml  R THIGH (E-G) 4809.60 ml  R FULL LIMB (A-G) 7838.5 ml  Limb Volume differential (LVD)  %  Volume change since initial %  Volume change overall V  (Blank rows = not tested)  LANDMARK LEFT  (Rx)  L LEG (A-D) 3189.9 ml  L THIGH (E-G) 4959.8 ml  L FULL LIMB (A-G) 8149.7 ml  Limb Volume differential (LVD)  LEG LVD = 5.32%, L>R; THIGH LVD = 3.12%, L>R; And full LLE LVD = 3.97%, L>R.   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  Skin  Description Hyper-Keratosis Peau d' Orange Shiny Tight Fibrotic/ Indurated Fatty Doughy Spongy/ boggy       R>L x  x   Skin dry Flaky WNL Macerated   mildly      Color Redness Varicosities Blanching Hemosiderin Stain Mottled        x   Odor Malodorous Yeast Fungal infection  WNL      x   Temperature Warm Cool wnl     x    Pitting Edema   1+ 2+ 3+ 4+ Non-pitting         x    Girth Symmetrical Asymmetrical                   Distribution    L>R toes to groin    Stemmer Sign Positive Negative   +L -R   Lymphorrhea History Of:  Present Absent     x    Wounds History Of Present Absent Venous Arterial Pressure Sheer     x        Signs of Infection Redness Warmth Erythema Acute Swelling Drainage Borders                    Sensation Light Touch Deep pressure Hypersensitivity   In tact Impaired In tact Impaired Absent Impaired   x  x  x     Nails WNL   Fungus nail dystrophy   x     Hair Growth Symmetrical Asymmetrical   x    Skin Creases Base of toes  Ankles   Base of Fingers knees       Abdominal pannus Thigh Lobules  Face/neck   x           GAIT: Distance walked: >500' Assistive device utilized: None Level of assistance: Complete Independence Comments: Functional ambulation and transfers [facility]  LYMPHEDEMA LIFE IMPACT SCALE (LLIS): Initial 11/15/22  50%  FOTO functional outcome measure: Deferred. FOTO discontinued.  TODAY'S TREATMENT:  MLD to BLQ emphasis on colon and LLE Pt edu for le SELF CARE  PATIENT EDUCATION:  Continued Pt/ CG edu for how function of cisterna chyli, part of the thoracic duct of deep abdominal lymphatics, assists with digestion by filtering many of the fats and proteins from the digestive system. Sub optimal lymphatic flow in this region may result in constipation, bloating, swelling, etc. MLD helps mobilize these protein and fats , and the rhythmic movement of manual lymphatic drainage stimulates digestive muscles and increase peristalsis. Provided printed resource for reference.  Topics include outcome of comparative limb volumetrics- starting limb volume differentials (LVDs), technology and gradient techniques used for short stretch, multilayer compression wrapping, simple  self-MLD, therapeutic lymphatic pumping exercises, skin/nail care, LE precautions,. compression garment recommendations and specifications, wear and care schedule and compression garment donning / doffing w assistive devices. Discussed progress towards all OT goals since commencing CDT. All questions answered to the Pt's satisfaction. Good return. Person educated: Patient  Education method: Explanation, Demonstration, and Handouts Education comprehension: verbalized understanding, returned demonstration, verbal cues required, and needs further education  HOME EXERCISE PROGRAM: BLE lymphatic pumping there ex using- 1 sets of 10 reps, each exercise in order-  1-2 x daily, bilaterally Simple self MLD 1 x daily Daily skin care to increase hydration, skin mobility and decrease infection risk- can be done during MLD Compression wraps 23/7 during intensive phase of CDT Compression garments/ devices during self-management phase of CDT  ASSESSMENT:  CLINICAL IMPRESSION:    Pt continues to tolerate and respond well to MLD accessing deep abdominal lymphatics and the ascending, transverse and descending colon as evidenced by reduced gut discomfort and constipation. MLD to abdominal lymphatics is known to increase peristalsis and to stimulate the parasympathetic nervous system promoting relaxation of the intestinal sphincter muscles. Pt has also been able to suspend frequent intestinal cleanses, reports decreased constipation and more regular bowel movements with regular MLD. Pt reports recently when she missed a week of OT these symptoms worsened. Pt tolerated abdominal MLD to deep gut  lymphatics without increased pain.  Performed short neck sequence as established, then performed alternate pressure and release on descending, transverse and ascending colon combined with diaphragmatic breathing in supine with knees flexed. We completed J strokes starting at distal end of colon (descending), then transverse and  finally ascending colon region. Continue teaching simple self MLD and encourage family visit to clinic to learn MLD techniques. Cont  1 x weekly as per POC.  11/15/22 Initial Evaluation: Gunda Maqueda is a 53 y.o. female presenting with very mild, stage II, LLE lymphedema 2/2 venous insufficiency and obesity. L Leg swelling and associated pain swelling fluctuates. It has progressed over time, and now no longer resolves with elevation over night. RLE swelling limits balance during functional ambulation. It exacerbates infection and falls risk. LLE/RLQ lymphedema interferes with functional performance in all occupational domains, including basic and instrumental ADLs, productive activities, leisure pursuits and social participation. Pt will benefit from Occupational Therapy for Complete Decongestive Therapy (CDT) to restore function, to reduce physical and psychologic suffering associated with chronic, progressive lymphedema and associated pain, and to limit infection. CDT will include manual lymphatic drainage (MLD), skin care, therapeutic exercise and compression wraps and garment's devices. Without skilled OT for lymphedema care the condition will worsen and further functional decline is expected  Custom-made gradient compression garments and HOS devices are medically necessary because they are uniquely sized and shaped to fit the exact dimensions of the affected extremities, and to  provide appropriate medical grade, graduated compression essential for optimally managing chronic, progressive lymphedema. Multiple custom compression garments are needed to ensure proper hygiene to limit infection risk. Custom compression garments should be replaced q 3-6 months When worn consistently for optimal lipo-lymphedema self-management over time. HOS devices, medically necessary to limit fibrosis buildup in tissue, should be replaced q 2 years and PRN when worn out.      OBJECTIVE IMPAIRMENTS: decreased activity  tolerance, decreased knowledge of condition, decreased knowledge of use of DME, increased edema, impaired sensation, pain, and chronic, progressive leg swelling.   ACTIVITY LIMITATIONS: dependent sitting, extended standing and/ or walking, squatting, and lower body dressing, fitting preferred street shoes  PARTICIPATION LIMITATIONS: shopping, community activity, occupation, and yard work  PERSONAL FACTORS: 3+ comorbidities: Hx DVT,  OSA, CVI. Varicose veins are also affecting patient's functional outcome.   REHAB POTENTIAL: Good  EVALUATION COMPLEXITY: Moderate   GOALS: Goals reviewed with patient? Yes  SHORT TERM GOALS: Target date: 4th OT Rx visit  SHORT TERM GOALS: Target date: 4th OT Rx visit   Pt will demonstrate understanding of lymphedema precautions and prevention strategies with modified independence using a printed reference to identify at least 5 precautions and discussing how s/he may implement them into daily life to reduce risk of progression with extra time. Baseline:Max A Goal status: GOAL MET   Pt will be able to apply multilayer, knee length, gradient, compression wraps to one leg at a time with modified assistance (extra time and assistive device/s) to decrease limb volume, to limit infection risk, and to limit lymphedema progression.  Baseline: Dependent Goal status: GOAL DISCHARGED. Pt Going to compression garments instead  LONG TERM GOALS: Target date: 02/08/23  Given this patient's Intake score of 67%/100% on the functional outcomes FOTO tool, patient will experience an increase in function of 5 points to improve basic and instrumental ADLs performance, including lymphedema self-care.  Baseline: Max A Goal status:GOAL DISCHARGED.  FOTO TOOL DISCONTINUED AT CLINIC   Given this patient's Intake score of 50% on the Lymphedema Life Impact Scale (LLIS), patient will experience a reduction of at least 5 points in her perceived level of functional impairment resulting  from lymphedema to improve functional performance and quality of life (QOL). Baseline: 50% Goal status: PROGRESSING  Pt will achieve at least a 10% volume reduction in B legs to return limb to typical size and shape, to limit infection risk and LE progression, to decrease pain, to improve function. Baseline: Dependent Goal status: PROGRESSING  4.  Pt will obtain proper compression garments/devices and achieve modified independence (extra time + assistive devices) with donning/doffing to optimize limb volume reductions and limit LE  progression over time. Baseline:  Goal status:GOAL MET  5.  During Intensive phase CDT , with modified independence, Pt will achieve at least 85% compliance with all lymphedema self-care home program components, including daily skin care, compression wraps and /or garments, simple self MLD and lymphatic pumping therex to habituate LE self care protocol  into ADLs for optimal LE self-management over time. Baseline: Dependent Goal status:GOAL MET   PLAN:  OT FREQUENCY: 1 /week  OT DURATION: 12 weeks and PRN  PLANNED INTERVENTIONS: 97110-Therapeutic exercises, 97530- Therapeutic activity, 97535- Self Care, 54098- Manual therapy, Patient/Family education, Manual lymph drainage, Compression bandaging, and fit with appropriate compression garments  PLAN FOR NEXT SESSION:  Cont MLD as established Pt and family edu for LE self-care  Arnold Bicker, MS, OTR/L, CLT-LANA 06/19/23 3:12 PM

## 2023-06-20 ENCOUNTER — Inpatient Hospital Stay (HOSPITAL_BASED_OUTPATIENT_CLINIC_OR_DEPARTMENT_OTHER): Payer: Self-pay | Admitting: Hematology & Oncology

## 2023-06-20 ENCOUNTER — Inpatient Hospital Stay: Attending: Hematology & Oncology

## 2023-06-20 ENCOUNTER — Encounter: Payer: Self-pay | Admitting: Hematology & Oncology

## 2023-06-20 ENCOUNTER — Other Ambulatory Visit: Payer: Self-pay

## 2023-06-20 DIAGNOSIS — M069 Rheumatoid arthritis, unspecified: Secondary | ICD-10-CM | POA: Diagnosis not present

## 2023-06-20 DIAGNOSIS — M25531 Pain in right wrist: Secondary | ICD-10-CM | POA: Diagnosis not present

## 2023-06-20 LAB — CBC WITH DIFFERENTIAL (CANCER CENTER ONLY)
Abs Immature Granulocytes: 0.01 10*3/uL (ref 0.00–0.07)
Basophils Absolute: 0 10*3/uL (ref 0.0–0.1)
Basophils Relative: 0 %
Eosinophils Absolute: 0.1 10*3/uL (ref 0.0–0.5)
Eosinophils Relative: 2 %
HCT: 37.3 % (ref 36.0–46.0)
Hemoglobin: 12 g/dL (ref 12.0–15.0)
Immature Granulocytes: 0 %
Lymphocytes Relative: 32 %
Lymphs Abs: 1.5 10*3/uL (ref 0.7–4.0)
MCH: 32.4 pg (ref 26.0–34.0)
MCHC: 32.2 g/dL (ref 30.0–36.0)
MCV: 100.8 fL — ABNORMAL HIGH (ref 80.0–100.0)
Monocytes Absolute: 0.4 10*3/uL (ref 0.1–1.0)
Monocytes Relative: 9 %
Neutro Abs: 2.6 10*3/uL (ref 1.7–7.7)
Neutrophils Relative %: 57 %
Platelet Count: 203 10*3/uL (ref 150–400)
RBC: 3.7 MIL/uL — ABNORMAL LOW (ref 3.87–5.11)
RDW: 13.1 % (ref 11.5–15.5)
WBC Count: 4.7 10*3/uL (ref 4.0–10.5)
nRBC: 0 % (ref 0.0–0.2)

## 2023-06-20 LAB — FERRITIN: Ferritin: 226 ng/mL (ref 11–307)

## 2023-06-20 LAB — IRON AND IRON BINDING CAPACITY (CC-WL,HP ONLY)
Iron: 106 ug/dL (ref 28–170)
Saturation Ratios: 32 % — ABNORMAL HIGH (ref 10.4–31.8)
TIBC: 333 ug/dL (ref 250–450)
UIBC: 227 ug/dL (ref 148–442)

## 2023-06-20 LAB — CMP (CANCER CENTER ONLY)
ALT: 28 U/L (ref 0–44)
AST: 17 U/L (ref 15–41)
Albumin: 4.5 g/dL (ref 3.5–5.0)
Alkaline Phosphatase: 113 U/L (ref 38–126)
Anion gap: 7 (ref 5–15)
BUN: 13 mg/dL (ref 6–20)
CO2: 30 mmol/L (ref 22–32)
Calcium: 9.4 mg/dL (ref 8.9–10.3)
Chloride: 103 mmol/L (ref 98–111)
Creatinine: 0.75 mg/dL (ref 0.44–1.00)
GFR, Estimated: >60 mL/min (ref >60)
Glucose, Bld: 74 mg/dL (ref 70–99)
Potassium: 4.1 mmol/L (ref 3.5–5.1)
Sodium: 140 mmol/L (ref 135–145)
Total Bilirubin: 0.5 mg/dL (ref 0.0–1.2)
Total Protein: 7.2 g/dL (ref 6.5–8.1)

## 2023-06-20 NOTE — Progress Notes (Signed)
 Hematology and Oncology Follow Up Visit  Karen [name] [id number] 19-Jan-1970 53 y.o. 06/20/2023   Principle Diagnosis:  Hemochromatosis, heterozygous H63D mutation  Current Therapy:   Observation   Interim History:  Karen [name] is here today for follow-up.  She now has rheumatoid arthritis.  She was diagnosed with this.  She has a brace on her right forearm.  She has had injections.  She currently is on methotrexate .  She is having a tough time with the methotrexate .  She does have constant GI issues.  I think that she has had a history of small bowel overgrowth.  As far as the hemochromatosis, this is not been much of a problem.  I know that her ferritin is always elevated because of inflammatory issues related to the rheumatoid arthritis.  I think is her iron saturation that we look at.  Last iron saturation was 25%.  She is still busy at work.  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  She has had no fever.  She has had no cough or shortness of breath.  Thankfully, there has been no problems with COVID or Influenza.  She has had no obvious change in bowel or bladder habits.  There has been no bleeding.  She has had no leg swelling or rashes.  Overall, I would say her performance status is probably ECOG 1.     Medications:  Allergies as of 06/20/2023       Reactions   Molds & Smuts Other (See Comments), Cough   sneezing   Alpha-gal         Medication List        Accurate as of June 20, 2023  1:26 PM. If you have any questions, ask your nurse or doctor.          STOP taking these medications    Motegrity  1 MG Tabs Generic drug: Prucalopride Succinate  Stopped by: Ivor Mars   Motegrity  2 MG Tabs Generic drug: Prucalopride Succinate  Stopped by: Karmella Bouvier R Analilia Geddis   sulfamethoxazole -trimethoprim  800-160 MG tablet Commonly known as: BACTRIM  DS Stopped by: Latrice Storlie R Hernandez [place]       TAKE these medications    Acetaminophen  500 MG  capsule 1 capsule as needed Orally every 6 hrs   albuterol  108 (90 Base) MCG/ACT inhaler Commonly known as: VENTOLIN  HFA Inhale 2 puffs into the lungs every 6 (six) hours as needed for wheezing or shortness of breath.   Elderberry 575 MG/5ML Syrp Take 30 mLs by mouth daily.   fluconazole  150 MG tablet Commonly known as: DIFLUCAN  Take 150 mg by mouth.   fluticasone  50 MCG/ACT nasal spray Commonly known as: FLONASE  Place 2 sprays into both nostrils daily.   folic acid  1 MG tablet Commonly known as: FOLVITE  Take 1 tablet (1 mg total) by mouth daily.   folic acid  1 MG tablet Commonly known as: FOLVITE  Take 2 tablets (2 mg total) by mouth daily.   lansoprazole  30 MG capsule Commonly known as: PREVACID  Take 1 capsule (30 mg total) by mouth daily.   levocetirizine 5 MG tablet Commonly known as: XYZAL  Take 1 tablet (5 mg total) by mouth every evening.   methotrexate  2.5 MG tablet Commonly known as: RHEUMATREX Take 4 tablets (10 mg total) by mouth (at the same time) once a week.   ondansetron  4 MG disintegrating tablet Commonly known as: ZOFRAN -ODT Dissolve 1 tablet (4 mg total) in mouth every 8 (eight) hours as needed for nausea   polyethylene  glycol 17 g packet Commonly known as: MIRALAX  / GLYCOLAX  Take 17 g by mouth every other day.   Restora RX 60-1.25 MG Caps Generic drug: Lactobacillus Casei-Folic Acid  Take 1 capsule by mouth every morning.   rizatriptan  5 MG tablet Commonly known as: MAXALT  Take 1 tablet (5 mg total) by mouth as needed for migraine. May repeat in 2 hours if needed, no more than 2 pills/24 hours, no more than 3 pills/week.   triamcinolone  ointment 0.1 % Commonly known as: KENALOG  Apply a pea sized amount to affected vulvar and perianal area 2 (two) times daily.   triamcinolone  ointment 0.1 % Commonly known as: KENALOG  Apply pea sized amount to affected vulvar and perianal area three times per week. Increase to twice daily for flares.    UNABLE TO FIND Med Name: NIFEDIPINE 0.3%/LIDO1.5% OINT   Xiidra  5 % Soln Generic drug: Lifitegrast  Place 1 drop into both eyes 2 (two) times daily.        Allergies:  Allergies  Allergen Reactions   Molds & Smuts Other (See Comments) and Cough    sneezing   Alpha-Gal     Past Medical History, Surgical history, Social history, and Family History were reviewed and updated.  Review of Systems: Review of Systems  Constitutional:  Positive for malaise/fatigue.  HENT: Negative.    Eyes: Negative.   Respiratory: Negative.    Cardiovascular: Negative.   Gastrointestinal:  Positive for abdominal pain, heartburn and nausea.  Genitourinary: Negative.   Musculoskeletal: Negative.   Skin: Negative.   Neurological: Negative.   Endo/Heme/Allergies: Negative.   Psychiatric/Behavioral: Negative.       Physical Exam:  weight is 211 lb (95.7 kg). Her oral temperature is 98.1 F (36.7 C). Her blood pressure is 122/89 and her pulse is 71. Her respiration is 18 and oxygen saturation is 100%.   Wt Readings from Last 3 Encounters:  06/20/23 211 lb (95.7 kg)  05/30/23 207 lb 6.4 oz (94.1 kg)  05/22/23 207 lb (93.9 kg)    Physical Exam Abdominal:     Comments: Abdominal exam is soft.  She has some tenderness throughout the abdomen to palpation.  She has decreased bowel sounds.  She has no obvious fluid wave.  There is no palpable liver or spleen tip.      Lab Results  Component Value Date   WBC 4.7 06/20/2023   HGB 12.0 06/20/2023   HCT 37.3 06/20/2023   MCV 100.8 (H) 06/20/2023   PLT 203 06/20/2023   Lab Results  Component Value Date   FERRITIN 331 (H) 05/18/2023   IRON 75 05/18/2023   TIBC 295 05/18/2023   UIBC 220 05/18/2023   IRONPCTSAT 25 05/18/2023   Lab Results  Component Value Date   RETICCTPCT 2.5 05/18/2023   RBC 3.70 (L) 06/20/2023   No results found for: Ansel Bass [facility] Lab Results  Component Value Date   IGGSERUM 1,438  12/08/2022   IGMSERUM 167 12/08/2022   No results found for: Hobson Luna, SPEI   Chemistry      Component Value Date/Time   NA 140 06/20/2023 1215   NA 140 03/21/2019 1158   K 4.1 06/20/2023 1215   CL 103 06/20/2023 1215   CO2 30 06/20/2023 1215   BUN 13 06/20/2023 1215   BUN 9 03/21/2019 1158   CREATININE 0.75 06/20/2023 1215      Component Value Date/Time   CALCIUM 9.4 06/20/2023 1215   ALKPHOS 113 06/20/2023  1215   AST 17 06/20/2023 1215   ALT 28 06/20/2023 1215   BILITOT 0.5 06/20/2023 1215       Impression and Plan: Ms. Gierke is very pleasant 53 yo African American female with hemochromatosis, heterozygous for the H63D mutation.   I feel bad that she has the rheumatoid arthritis.  I know that she does see a rheumatologist.  I am sure that the rheumatologist will be very aggressive in treating the arthritis.  We will see what the iron studies look like.  Again, we will have to go with her iron saturation.  We will go ahead and plan to get her back in another 6 months.  I really do not think this is a bad time interval for us .  Ivor Mars, MD 6/17/20251:26 PM

## 2023-06-21 ENCOUNTER — Telehealth: Payer: Self-pay | Admitting: Neurology

## 2023-06-21 ENCOUNTER — Ambulatory Visit: Payer: Self-pay | Admitting: Hematology & Oncology

## 2023-06-21 NOTE — Telephone Encounter (Signed)
 I called pt and she stated that Dr. Meredith Stalls have diagnosed her with Rheumatoid Arthrits, started her on methotrexate  06-05-2023.  She noted increased worsening headaches and migraines.  She normally takes tylenol  but that is not cutting it and so was concerned about taking maxalt  and methotrexate .  In the mean time she has spoken to Dr. Meredith Stalls today due to SE she stopped methotrexate  and folic acid  and she has appt Monday.    Pt would like Dr. Omar Bibber to weigh in on on this even though off methotrexate . Can RA cause increase headaches, or even hemochromatosis increase headaches. She said she also has G6PD genetic disorder, (issue with metabolizing some drugs.  ? About maxalt .  I relayed she was out of the office today and would address tomorrow.  She verbalized understanding

## 2023-06-21 NOTE — Telephone Encounter (Signed)
 Pt called   to request  to speak to   MD . Pt  was prescribe medication from another doctor and want to know if the medication will interfere with medication from MD here .  Pt arthritis medication is giving her head aches and she want to know if she should be taking both .

## 2023-06-22 ENCOUNTER — Inpatient Hospital Stay

## 2023-06-22 NOTE — Telephone Encounter (Signed)
 I am not sure of any major interactions with methotrexate  and Maxalt  to be honest, it would be safest to talk to the pharmacist about these concerns.  I am not familiar with G6PD and metabolism of Maxalt  unfortunately either.  I do not think she has had problems taking the Maxalt  thus far, so she should be fine continuing to take it as needed.

## 2023-06-22 NOTE — Progress Notes (Signed)
 Pt came into center to have phlebotomy treatment. Pt had 6 IV attempts by 3 nurses. Despite several attempts,, veins were unable to support angiocaths , veins would blow. Pt educated on Port-a-cath system.  Note sent to Dr. Maria Shiner to set up port-a-cath placement

## 2023-06-26 DIAGNOSIS — L280 Lichen simplex chronicus: Secondary | ICD-10-CM | POA: Diagnosis not present

## 2023-06-26 DIAGNOSIS — M79641 Pain in right hand: Secondary | ICD-10-CM | POA: Diagnosis not present

## 2023-06-26 DIAGNOSIS — M0600 Rheumatoid arthritis without rheumatoid factor, unspecified site: Secondary | ICD-10-CM | POA: Diagnosis not present

## 2023-06-26 DIAGNOSIS — M79642 Pain in left hand: Secondary | ICD-10-CM | POA: Diagnosis not present

## 2023-06-26 DIAGNOSIS — Z6832 Body mass index (BMI) 32.0-32.9, adult: Secondary | ICD-10-CM | POA: Diagnosis not present

## 2023-06-26 DIAGNOSIS — M3501 Sicca syndrome with keratoconjunctivitis: Secondary | ICD-10-CM | POA: Diagnosis not present

## 2023-06-26 DIAGNOSIS — E669 Obesity, unspecified: Secondary | ICD-10-CM | POA: Diagnosis not present

## 2023-06-27 ENCOUNTER — Inpatient Hospital Stay

## 2023-06-27 ENCOUNTER — Other Ambulatory Visit: Payer: Self-pay | Admitting: *Deleted

## 2023-06-27 DIAGNOSIS — M25531 Pain in right wrist: Secondary | ICD-10-CM | POA: Diagnosis not present

## 2023-06-27 DIAGNOSIS — M069 Rheumatoid arthritis, unspecified: Secondary | ICD-10-CM | POA: Diagnosis not present

## 2023-06-27 NOTE — Patient Instructions (Signed)

## 2023-06-27 NOTE — Progress Notes (Signed)
 Karen [name] presents today for phlebotomy per MD orders. Phlebotomy procedure started at 1325 by Dorothyann Pinal RN with 20 g angiocath and ended at 1345. 80 grams removed. Blood stopped coming through tubing.  IV Dcd.  Refreshments given.   Patient tolerated procedure well. IV needle removed intact. Patient declined to attempt a full phlebotomy.  Patient wants to consider port placement.  Orders were placed.

## 2023-06-29 ENCOUNTER — Inpatient Hospital Stay: Payer: Self-pay

## 2023-06-29 ENCOUNTER — Other Ambulatory Visit (HOSPITAL_COMMUNITY): Payer: Self-pay

## 2023-06-29 ENCOUNTER — Ambulatory Visit: Payer: Commercial Managed Care - PPO | Admitting: Hematology & Oncology

## 2023-06-29 DIAGNOSIS — R1310 Dysphagia, unspecified: Secondary | ICD-10-CM | POA: Diagnosis not present

## 2023-06-29 DIAGNOSIS — R197 Diarrhea, unspecified: Secondary | ICD-10-CM | POA: Diagnosis not present

## 2023-06-29 DIAGNOSIS — R109 Unspecified abdominal pain: Secondary | ICD-10-CM | POA: Diagnosis not present

## 2023-06-29 DIAGNOSIS — K602 Anal fissure, unspecified: Secondary | ICD-10-CM | POA: Diagnosis not present

## 2023-06-29 DIAGNOSIS — K2100 Gastro-esophageal reflux disease with esophagitis, without bleeding: Secondary | ICD-10-CM | POA: Diagnosis not present

## 2023-06-29 MED ORDER — LANSOPRAZOLE 30 MG PO CPDR
30.0000 mg | DELAYED_RELEASE_CAPSULE | Freq: Every day | ORAL | 3 refills | Status: AC
  Filled 2023-06-29 – 2023-08-28 (×2): qty 90, 90d supply, fill #0
  Filled 2023-11-23: qty 90, 90d supply, fill #1

## 2023-06-29 MED ORDER — ONDANSETRON 4 MG PO TBDP
4.0000 mg | ORAL_TABLET | Freq: Three times a day (TID) | ORAL | 2 refills | Status: DC | PRN
  Filled 2023-06-29: qty 30, 10d supply, fill #0

## 2023-06-30 ENCOUNTER — Encounter: Payer: Self-pay | Admitting: Hematology & Oncology

## 2023-06-30 ENCOUNTER — Other Ambulatory Visit (HOSPITAL_COMMUNITY): Payer: Self-pay

## 2023-06-30 ENCOUNTER — Ambulatory Visit: Admitting: Occupational Therapy

## 2023-06-30 DIAGNOSIS — I890 Lymphedema, not elsewhere classified: Secondary | ICD-10-CM

## 2023-06-30 NOTE — Therapy (Signed)
 OUTPATIENT OCCUPATIONAL THERAPY TREATMENT NOTE   BLE/ BLQ LYMPHEDEMA  Patient Name: Karen [name] MRN: [id number] DOB:[birth year], 53 y.o., female Today's Date: 06/30/2023  END OF SESSION:   OT End of Session - 06/30/23 1208     Visit Number 46    Number of Visits 72    Date for OT Re-Evaluation 08/17/23    OT Start Time 1010    OT Stop Time 1105    OT Time Calculation (min) 55 min    Activity Tolerance Patient tolerated treatment well;No increased pain    Behavior During Therapy [facility] for tasks assessed/performed            Past Medical History:  Diagnosis Date   Adenomyosis    Anemia    Arthritis 2013   L knee gets steroid injections    Asthmatic bronchitis 01/31/2017   Back pain    Chronic constipation    Diarrhea    DVT of lower extremity (deep venous thrombosis) (HCC)    Dysmenorrhea    Endometriosis    Esophageal stricture    G6PD deficiency    GERD (gastroesophageal reflux disease)    History of kidney stones    History of uterine fibroid    IBS (irritable bowel syndrome)    Interstitial cystitis    Lactose intolerance    Migraine    with aura   Other hemochromatosis 06/10/2021   PONV (postoperative nausea and vomiting)    Recurrent upper respiratory infection (URI)    Sebaceous cyst of breast, right lower inner quadrant 10/25/2012   Excised 11/21/12    Sleep apnea    Syncope, non cardiac    Past Surgical History:  Procedure Laterality Date   ARTHROSCOPIC HAGLUNDS REPAIR     BREAST CYST EXCISION Right 11/21/2012   Procedure: CYST EXCISION BREAST;  Surgeon: Sherlean JINNY Laughter, MD;  Location: [facility];  Service: General;  Laterality: Right;   BREAST CYST EXCISION Bilateral 01/18/2022   Procedure: EXCISION OF SEBACEOUS CYST BILATERAL BREAST;  Surgeon: Vanderbilt Ned, MD;  Location: [facility];  Service: General;  Laterality: Bilateral;   BREAST EXCISIONAL BIOPSY Right 11/2012   CESAREAN SECTION  04/19/1993    CHOLECYSTECTOMY     COLONOSCOPY  05/02/2011   Procedure: COLONOSCOPY;  Surgeon: Lynwood LITTIE Celestia Mickey., MD;  Location: [facility];  Service: Endoscopy;  Laterality: N/A;   colonoscopy  11/04/2014   DENTAL SURGERY  03/06/2018   2 surgeries ( 03/06/2018 and 04/06/2018)   to remove two separate benign tumors   ESOPHAGEAL MANOMETRY N/A 11/04/2015   Procedure: ESOPHAGEAL MANOMETRY (EM);  Surgeon: Lynwood Celestia, MD;  Location: [facility];  Service: Endoscopy;  Laterality: N/A;   ESOPHAGOGASTRODUODENOSCOPY  05/02/2011   Procedure: ESOPHAGOGASTRODUODENOSCOPY (EGD);  Surgeon: Lynwood LITTIE Celestia Mickey., MD;  Location: [facility];  Service: Endoscopy;  Laterality: N/A;   ESOPHAGOGASTRODUODENOSCOPY N/A 09/01/2014   Procedure: ESOPHAGOGASTRODUODENOSCOPY (EGD);  Surgeon: Lynwood Celestia, MD;  Location: [facility];  Service: Endoscopy;  Laterality: N/A;   KNEE SURGERY     LAPAROSCOPY     x 4   LESION REMOVAL N/A 01/18/2022   Procedure: EXCISION OF SEBACEOUS CYSTS CHEST AND NECK;  Surgeon: Vanderbilt Ned, MD;  Location: [facility];  Service: General;  Laterality: N/A;   PH IMPEDANCE STUDY N/A 11/04/2015   Procedure: PH IMPEDANCE STUDY;  Surgeon: Lynwood Celestia, MD;  Location: [facility];  Service: Endoscopy;  Laterality: N/A;   VAGINAL HYSTERECTOMY  2010   TVH--ovaries remain  Patient Active Problem List   Diagnosis Date Noted   Sjogren syndrome with keratoconjunctivitis (HCC) 12/09/2022   Other hemochromatosis 06/10/2021   Elevated LFTs 04/04/2021   Colitis 04/04/2021   Bacterial overgrowth syndrome 05/21/2020   Obesity 05/21/2020   History of endometriosis 05/21/2020   Constipation due to outlet dysfunction 02/05/2020   Gastroesophageal reflux disease 02/05/2020   Obstructive sleep apnea treated with continuous positive airway pressure (CPAP) 06/16/2017   Perennial allergic rhinitis with a nonallergic component 01/31/2017   Asthmatic bronchitis 01/31/2017   GI symptoms 01/31/2017    Family history of colon cancer 01/27/2015   Chest pain 12/13/2012   Dyspnea 12/13/2012   DVT of lower extremity (deep venous thrombosis) (HCC) 01/03/1990    PCP: Gaile New, MD  REFERRING PROVIDER: Valery Ripple, MD  REFERRING DIAG: I89.0  THERAPY DIAG:  Lymphedema, not elsewhere classified  Rationale for Evaluation and Treatment: Rehabilitation  ONSET DATE: ~2014; exacerbation in 2023  SUBJECTIVE:                                                                                                                                                                                          SUBJECTIVE STATEMENT:  Pt presents for OT to address lower quadrant and LLE lymphedema and associated pain/ discomfort.  Pt continues to wear R long thumb spica splint and reports wrist pain 4/10. She is undergoing OT for her hand  1 x weekly.  Pt reports ongoing symptom relief,  including decreased gut and abdominal discomfort and improved bowel motility / peristalsis with abdominal MLD. OT and Pt have discussed teaching adult daughter these techniques, but this has not been scheduled yet.  PERTINENT HISTORY: CVI, OSA (no CPAP), GERD, Esophageal stricture, IBS, Lactose intolerant, Diarrhea, Hx LE DVT  PAIN:  Are you having pain? Yes: 2/10  Pain location: LLE, digestive discomfort Pain description: myalgia, tight, heavy, tingling, numbness Aggravating factors: standing, walking, dependent sitting > 30 minutes Relieving factors: elevation  PRECAUTIONS: Other: LYMPHEDEMA; Hx LLE DVT  WEIGHT BEARING RESTRICTIONS: No  FALLS:  Has patient fallen in last 6 months? No  LIVING ENVIRONMENT: Lives with: lives with spouse and daughter Lives in: House/apartment Stairs: Yes; Internal: 14 steps; yes and External: 4 steps; yes Has following equipment at home: None  OCCUPATION: [employer] operational officer, walking, desk work  LEISURE: family time  HAND DOMINANCE: right   PRIOR LEVEL OF FUNCTION:  Independent  PATIENT GOALS: Feel better, be able to be more active without pain, wear preferred street shoes  OBJECTIVE: Note: Objective measures were completed at Evaluation unless otherwise noted.  COGNITION:  Overall cognitive status: Within functional limits for tasks  assessed   POSTURE: WFL  LE ROM: WFL  LE MMT: WFL  LYMPHEDEMA ASSESSMENTS: non-Ca related  INFECTIONS: denies cellulitis and wound hx  BLE COMPARATIVE LIMB VOLUMETRICS Initial 11/15/22  LANDMARK RIGHT (dominant)  R LEG (A-D) 3028.9 ml  R THIGH (E-G) 4809.60 ml  R FULL LIMB (A-G) 7838.5 ml  Limb Volume differential (LVD)  %  Volume change since initial %  Volume change overall V  (Blank rows = not tested)  LANDMARK LEFT  (Rx)  L LEG (A-D) 3189.9 ml  L THIGH (E-G) 4959.8 ml  L FULL LIMB (A-G) 8149.7 ml  Limb Volume differential (LVD)  LEG LVD = 5.32%, L>R; THIGH LVD = 3.12%, L>R; And full LLE LVD = 3.97%, L>R.   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  Skin  Description Hyper-Keratosis Peau d' Orange Shiny Tight Fibrotic/ Indurated Fatty Doughy Spongy/ boggy       R>L x  x   Skin dry Flaky WNL Macerated   mildly      Color Redness Varicosities Blanching Hemosiderin Stain Mottled        x   Odor Malodorous Yeast Fungal infection  WNL      x   Temperature Warm Cool wnl     x    Pitting Edema   1+ 2+ 3+ 4+ Non-pitting         x   Girth Symmetrical Asymmetrical                   Distribution    L>R toes to groin    Stemmer Sign Positive Negative   +L -R   Lymphorrhea History Of:   Present Absent     x    Wounds History Of Present Absent Venous Arterial Pressure Sheer     x        Signs of Infection Redness Warmth Erythema Acute Swelling Drainage Borders                    Sensation Light Touch Deep pressure Hypersensitivity   In tact Impaired In tact Impaired Absent Impaired   x  x  x     Nails WNL   Fungus nail dystrophy   x     Hair Growth Symmetrical Asymmetrical   x    Skin Creases Base of toes  Ankles   Base of Fingers knees       Abdominal pannus Thigh Lobules  Face/neck   x           GAIT: Distance walked: >500' Assistive device utilized: None Level of assistance: Complete Independence Comments: Functional ambulation and transfers [facility]  LYMPHEDEMA LIFE IMPACT SCALE (LLIS): Initial 11/15/22  50%  FOTO functional outcome measure: Deferred. FOTO discontinued.  TODAY'S TREATMENT:  MLD to BLQ emphasis on colon and LLE Pt edu for le SELF CARE  PATIENT EDUCATION:  Continued Pt/ CG edu for how function of cisterna chyli, part of the thoracic duct of deep abdominal lymphatics, assists with digestion by filtering many of the fats and proteins from the digestive system. Sub optimal lymphatic flow in this region may result in constipation, bloating, swelling, etc. MLD helps mobilize these protein and fats , and the rhythmic movement of manual lymphatic drainage stimulates digestive muscles and increase peristalsis. Provided printed resource for reference.  Topics include outcome of comparative limb volumetrics- starting limb volume differentials (LVDs), technology and gradient techniques used for short stretch, multilayer compression wrapping, simple self-MLD, therapeutic lymphatic pumping exercises, skin/nail care, LE precautions,. compression garment recommendations and specifications, wear and care schedule and  compression garment donning / doffing w assistive devices. Discussed progress towards all OT goals since commencing CDT. All questions answered to the Pt's satisfaction. Good return. Person educated: Patient  Education method: Explanation, Demonstration, and Handouts Education comprehension: verbalized understanding, returned demonstration, verbal cues required, and needs further education  HOME EXERCISE PROGRAM: BLE lymphatic pumping there ex using- 1 sets of 10 reps, each exercise in order-  1-2 x daily, bilaterally Simple self MLD 1 x daily Daily skin care to increase hydration, skin mobility and decrease infection risk- can be done during MLD Compression wraps 23/7 during intensive phase of CDT Compression garments/ devices during self-management phase of CDT  ASSESSMENT:  CLINICAL IMPRESSION:    Pt continues to tolerate and respond well to MLD accessing deep abdominal lymphatics and the ascending, transverse and descending colon as evidenced by reduced gut discomfort and constipation. MLD to abdominal lymphatics is known to increase peristalsis and to stimulate the parasympathetic nervous system promoting relaxation of the intestinal sphincter muscles. Pt has also been able to suspend frequent intestinal cleanses, reports decreased constipation and more regular bowel movements with regular MLD. Pt reports recently when she missed a week of OT these symptoms worsened. Pt tolerated abdominal MLD to deep gut  lymphatics without increased pain.  Performed short neck sequence as established, then performed alternate pressure and release on descending, transverse and ascending colon combined with diaphragmatic breathing in supine with knees flexed. We completed J strokes starting at distal end of colon (descending), then transverse and finally ascending colon region. Continue teaching simple self MLD and encourage family visit to clinic to learn MLD techniques. Cont  1 x weekly as per POC.  11/15/22  Initial Evaluation: Shandie Bertz is a 53 y.o. female presenting with very mild, stage II, LLE lymphedema 2/2 venous insufficiency and obesity. L Leg swelling and associated pain swelling fluctuates. It has progressed over time, and now no longer resolves with elevation over night. RLE swelling limits balance during functional ambulation. It exacerbates infection and falls risk. LLE/RLQ lymphedema interferes with functional performance in all occupational domains, including basic and instrumental ADLs, productive activities, leisure pursuits and social participation. Pt will benefit from Occupational Therapy for Complete Decongestive Therapy (CDT) to restore function, to reduce physical and psychologic suffering associated with chronic, progressive lymphedema and associated pain, and to limit infection. CDT will include manual lymphatic drainage (MLD), skin care, therapeutic exercise and compression wraps and garment's devices. Without skilled OT for lymphedema care the condition will worsen and further functional decline is expected  Custom-made gradient compression garments and HOS devices are medically necessary because they are uniquely sized and shaped to fit the exact dimensions of the affected extremities, and to  provide appropriate medical grade, graduated compression essential for optimally managing chronic, progressive lymphedema. Multiple custom compression garments are needed to ensure proper hygiene to limit infection risk. Custom compression garments should be replaced q 3-6 months When worn consistently for optimal lipo-lymphedema self-management over time. HOS devices, medically necessary to limit fibrosis buildup in tissue, should be replaced q 2 years and PRN when worn out.      OBJECTIVE IMPAIRMENTS: decreased activity tolerance, decreased knowledge of condition, decreased knowledge of use of DME, increased edema, impaired sensation, pain, and chronic, progressive leg swelling.   ACTIVITY  LIMITATIONS: dependent sitting, extended standing and/ or walking, squatting, and lower body dressing, fitting preferred street shoes  PARTICIPATION LIMITATIONS: shopping, community activity, occupation, and yard work  PERSONAL FACTORS: 3+ comorbidities: Hx DVT,  OSA, CVI. Varicose veins are also affecting patient's functional outcome.   REHAB POTENTIAL: Good  EVALUATION COMPLEXITY: Moderate   GOALS: Goals reviewed with patient? Yes  SHORT TERM GOALS: Target date: 4th OT Rx visit  SHORT TERM GOALS: Target date: 4th OT Rx visit   Pt will demonstrate understanding of lymphedema precautions and prevention strategies with modified independence using a printed reference to identify at least 5 precautions and discussing how s/he may implement them into daily life to reduce risk of progression with extra time. Baseline:Max A Goal status: GOAL MET   Pt will be able to apply multilayer, knee length, gradient, compression wraps to one leg at a time with modified assistance (extra time and assistive device/s) to decrease limb volume, to limit infection risk, and to limit lymphedema progression.  Baseline: Dependent Goal status: GOAL DISCHARGED. Pt Going to compression garments instead  LONG TERM GOALS: Target date: 02/08/23  Given this patient's Intake score of 67%/100% on the functional outcomes FOTO tool, patient will experience an increase in function of 5 points to improve basic and instrumental ADLs performance, including lymphedema self-care.  Baseline: Max A Goal status:GOAL DISCHARGED.  FOTO TOOL DISCONTINUED AT CLINIC   Given this patient's Intake score of 50% on the Lymphedema Life Impact Scale (LLIS), patient will experience a reduction of at least 5 points in her perceived level of functional impairment resulting from lymphedema to improve functional performance and quality of life (QOL). Baseline: 50% Goal status: PROGRESSING  Pt will achieve at least a 10% volume reduction in B  legs to return limb to typical size and shape, to limit infection risk and LE progression, to decrease pain, to improve function. Baseline: Dependent Goal status: PROGRESSING  4.  Pt will obtain proper compression garments/devices and achieve modified independence (extra time + assistive devices) with donning/doffing to optimize limb volume reductions and limit LE  progression over time. Baseline:  Goal status:GOAL MET  5.  During Intensive phase CDT , with modified independence, Pt will achieve at least 85% compliance with all lymphedema self-care home program components, including daily skin care, compression wraps and /or garments, simple self MLD and lymphatic pumping therex to habituate LE self care protocol  into ADLs for optimal LE self-management over time. Baseline: Dependent Goal status:GOAL MET   PLAN:  OT FREQUENCY: 1 /week  OT DURATION: 12 weeks and PRN  PLANNED INTERVENTIONS: 97110-Therapeutic exercises, 97530- Therapeutic activity, 97535- Self Care, [id number]- Manual therapy, Patient/Family education, Manual lymph drainage, Compression bandaging, and fit with appropriate compression garments  PLAN FOR NEXT SESSION:  Cont MLD as established Pt and family edu for LE self-care  Zebedee Dec, MS, OTR/L, CLT-LANA 06/30/23 12:10 PM

## 2023-07-03 ENCOUNTER — Encounter: Payer: Self-pay | Admitting: Hematology & Oncology

## 2023-07-03 ENCOUNTER — Other Ambulatory Visit (HOSPITAL_COMMUNITY): Payer: Self-pay

## 2023-07-03 ENCOUNTER — Encounter (HOSPITAL_BASED_OUTPATIENT_CLINIC_OR_DEPARTMENT_OTHER): Payer: Self-pay | Admitting: Obstetrics & Gynecology

## 2023-07-03 ENCOUNTER — Ambulatory Visit (INDEPENDENT_AMBULATORY_CARE_PROVIDER_SITE_OTHER): Admitting: Obstetrics & Gynecology

## 2023-07-03 VITALS — BP 126/85 | HR 87 | Wt 214.8 lb

## 2023-07-03 DIAGNOSIS — L280 Lichen simplex chronicus: Secondary | ICD-10-CM | POA: Diagnosis not present

## 2023-07-03 DIAGNOSIS — Z8742 Personal history of other diseases of the female genital tract: Secondary | ICD-10-CM | POA: Diagnosis not present

## 2023-07-03 NOTE — Progress Notes (Unsigned)
 GYNECOLOGY  VISIT  CC:   vulvar recheck  HPI: 53 y.o. G2P2 Married [place] American female here for vulvar recheck.  H/o vulvar irritation/itching.  She did see Dr. Marciano at [facility].  She has recently been diagnosed with RA.  Has failed methotrexate  and plaquenil .  She is waiting for approval of Humira.  Followed by Dr. Ishmael.  She also has hemochromatosis and her iron saturation was 32.  Dr. Timmy wants this below 30.  She is having a port placed on Thursday at [facility] and then will have the phlebotomy done through her port.    From vulvar standpoint, not having any itching or burning.  She was able to stop using the topical steroid.  She hasn't used any in more than 30 days.  She did have endoscopy and colonoscopy done at [facility] on 05/25/2023.    Past Medical History:  Diagnosis Date   Adenomyosis    Anemia    Arthritis 2013   L knee gets steroid injections    Asthmatic bronchitis 01/31/2017   Back pain    Chronic constipation    Diarrhea    DVT of lower extremity (deep venous thrombosis) (HCC)    Dysmenorrhea    Endometriosis    Esophageal stricture    G6PD deficiency    GERD (gastroesophageal reflux disease)    History of kidney stones    History of uterine fibroid    IBS (irritable bowel syndrome)    Interstitial cystitis    Lactose intolerance    Migraine    with aura   Other hemochromatosis 06/10/2021   PONV (postoperative nausea and vomiting)    Recurrent upper respiratory infection (URI)    Sebaceous cyst of breast, right lower inner quadrant 10/25/2012   Excised 11/21/12    Sleep apnea    Syncope, non cardiac     MEDS:   Current Outpatient Medications on File Prior to Visit  Medication Sig Dispense Refill   Acetaminophen  500 MG capsule 1 capsule as needed Orally every 6 hrs     albuterol  (VENTOLIN  HFA) 108 (90 Base) MCG/ACT inhaler Inhale 2 puffs into the lungs every 6 (six) hours as needed for wheezing or shortness of breath. 6.7 g 2    Elderberry 575 MG/5ML SYRP Take 30 mLs by mouth daily.     fluconazole  (DIFLUCAN ) 150 MG tablet Take 150 mg by mouth.     fluticasone  (FLONASE ) 50 MCG/ACT nasal spray Place 2 sprays into both nostrils daily. 16 g 5   folic acid  (FOLVITE ) 1 MG tablet Take 1 tablet (1 mg total) by mouth daily. 90 tablet 3   folic acid  (FOLVITE ) 1 MG tablet Take 2 tablets (2 mg total) by mouth daily. 180 tablet 3   Lactobacillus Casei-Folic Acid  (RESTORA RX) 60-1.25 MG CAPS Take 1 capsule by mouth every morning. 30 capsule 11   lansoprazole  (PREVACID ) 30 MG capsule Take 1 capsule (30 mg total) by mouth daily. 90 capsule 3   levocetirizine (XYZAL ) 5 MG tablet Take 1 tablet (5 mg total) by mouth every evening. 90 tablet 1   Lifitegrast  (XIIDRA ) 5 % SOLN Place 1 drop into both eyes 2 (two) times daily. 180 each 3   methotrexate  (RHEUMATREX) 2.5 MG tablet Take 4 tablets (10 mg total) by mouth (at the same time) once a week. 16 tablet 3   ondansetron  (ZOFRAN -ODT) 4 MG disintegrating tablet Dissolve 1 tablet (4 mg total) in mouth every 8 (eight) hours as needed for nausea 30  tablet 2   ondansetron  (ZOFRAN -ODT) 4 MG disintegrating tablet Take 1 tablet (4 mg total) by mouth every 8 (eight) hours as needed for nausea. 30 tablet 2   polyethylene glycol (MIRALAX  / GLYCOLAX ) 17 g packet Take 17 g by mouth every other day.     rizatriptan  (MAXALT ) 5 MG tablet Take 1 tablet (5 mg total) by mouth as needed for migraine. May repeat in 2 hours if needed, no more than 2 pills/24 hours, no more than 3 pills/week. 10 tablet 0   triamcinolone  ointment (KENALOG ) 0.1 % Apply a pea sized amount to affected vulvar and perianal area 2 (two) times daily. 30 g 5   triamcinolone  ointment (KENALOG ) 0.1 % Apply pea sized amount to affected vulvar and perianal area three times per week. Increase to twice daily for flares. 30 g 5   UNABLE TO FIND Med Name: NIFEDIPINE 0.3%/LIDO1.5% OINT     adalimumab (HUMIRA, 2 PEN,) 40 MG/0.4ML pen 0.4 mL  Subcutaneous EVERY 2 WEEKS for 30 days (Patient not taking: Reported on 07/03/2023)     No current facility-administered medications on file prior to visit.    ALLERGIES: Molds & smuts, Alpha-gal, and Methotrexate   SH:  ***  ROS  PHYSICAL EXAMINATION:    BP 126/85 (BP Location: Left Arm, Patient Position: Sitting, Cuff Size: Large)   Pulse 87   Wt 214 lb 12.8 oz (97.4 kg)   SpO2 97%   BMI 33.71 kg/m     General appearance: alert, cooperative and appears stated age Neck: no adenopathy, supple, symmetrical, trachea midline and thyroid  {CHL AMB PHY EX THYROID  NORM DEFAULT:[phone]::normal to inspection and palpation} CV:  {Exam; heart brief:31539} Lungs:  {pe lungs ob:314451} Breasts: {Exam; breast:13139::normal appearance, no masses or tenderness} Abdomen: soft, non-tender; bowel sounds normal; no masses,  no organomegaly Lymph:  no inguinal LAD noted  Pelvic: External genitalia:  no lesions              Urethra:  normal appearing urethra with no masses, tenderness or lesions              Bartholins and Skenes: normal                 Vagina: {exam; pelvic vaginal:30846}              Cervix: {CHL AMB PHY EX CERVIX NORM DEFAULT:[phone]::no lesions}              Bimanual Exam:  Uterus:  {CHL AMB PHY EX UTERUS NORM DEFAULT:[phone]::normal size, contour, position, consistency, mobility, non-tender}              Adnexa: {CHL AMB PHY EX ADNEXA NO MASS DEFAULT:[phone]::no mass, fullness, tenderness}              Rectovaginal: {yes no:[id number]}.  Confirms.              Anus:  normal sphincter tone, no lesions  Chaperone, ***, CMA, was present for exam.  Assessment/Plan: There are no diagnoses linked to this encounter.

## 2023-07-04 ENCOUNTER — Ambulatory Visit: Attending: Surgery | Admitting: Occupational Therapy

## 2023-07-04 DIAGNOSIS — I890 Lymphedema, not elsewhere classified: Secondary | ICD-10-CM | POA: Insufficient documentation

## 2023-07-04 NOTE — Therapy (Signed)
 OUTPATIENT OCCUPATIONAL THERAPY TREATMENT NOTE   BLE/ BLQ LYMPHEDEMA  Patient Name: Karen [name] MRN: [id number] DOB:29-Nov-1970, 53 y.o., female Today's Date: 07/04/2023  END OF SESSION:   OT End of Session - 07/04/23 1009     Visit Number 47    Number of Visits 72    Date for OT Re-Evaluation 08/17/23    OT Start Time 0805    OT Stop Time 0905    OT Time Calculation (min) 60 min    Activity Tolerance Patient tolerated treatment well;No increased pain    Behavior During Therapy [facility] for tasks assessed/performed            Past Medical History:  Diagnosis Date   Adenomyosis    Anemia    Arthritis 2013   L knee gets steroid injections    Asthmatic bronchitis 01/31/2017   Back pain    Chronic constipation    Diarrhea    DVT of lower extremity (deep venous thrombosis) (HCC)    Dysmenorrhea    Endometriosis    Esophageal stricture    G6PD deficiency    GERD (gastroesophageal reflux disease)    History of kidney stones    History of uterine fibroid    IBS (irritable bowel syndrome)    Interstitial cystitis    Lactose intolerance    Migraine    with aura   Other hemochromatosis 06/10/2021   PONV (postoperative nausea and vomiting)    Recurrent upper respiratory infection (URI)    Sebaceous cyst of breast, right lower inner quadrant 10/25/2012   Excised 11/21/12    Sleep apnea    Syncope, non cardiac    Past Surgical History:  Procedure Laterality Date   ARTHROSCOPIC HAGLUNDS REPAIR     BREAST CYST EXCISION Right 11/21/2012   Procedure: CYST EXCISION BREAST;  Surgeon: Sherlean JINNY Laughter, MD;  Location: [facility];  Service: General;  Laterality: Right;   BREAST CYST EXCISION Bilateral 01/18/2022   Procedure: EXCISION OF SEBACEOUS CYST BILATERAL BREAST;  Surgeon: Vanderbilt Ned, MD;  Location: [facility];  Service: General;  Laterality: Bilateral;   BREAST EXCISIONAL BIOPSY Right 11/2012   CESAREAN SECTION  04/19/1993    CHOLECYSTECTOMY     COLONOSCOPY  05/02/2011   Procedure: COLONOSCOPY;  Surgeon: Lynwood LITTIE Celestia Mickey., MD;  Location: [facility];  Service: Endoscopy;  Laterality: N/A;   colonoscopy  11/04/2014   DENTAL SURGERY  03/06/2018   2 surgeries ( 03/06/2018 and 04/06/2018)   to remove two separate benign tumors   ESOPHAGEAL MANOMETRY N/A 11/04/2015   Procedure: ESOPHAGEAL MANOMETRY (EM);  Surgeon: Lynwood Celestia, MD;  Location: [facility];  Service: Endoscopy;  Laterality: N/A;   ESOPHAGOGASTRODUODENOSCOPY  05/02/2011   Procedure: ESOPHAGOGASTRODUODENOSCOPY (EGD);  Surgeon: Lynwood LITTIE Celestia Mickey., MD;  Location: [facility];  Service: Endoscopy;  Laterality: N/A;   ESOPHAGOGASTRODUODENOSCOPY N/A 09/01/2014   Procedure: ESOPHAGOGASTRODUODENOSCOPY (EGD);  Surgeon: Lynwood Celestia, MD;  Location: [facility];  Service: Endoscopy;  Laterality: N/A;   KNEE SURGERY     LAPAROSCOPY     x 4   LESION REMOVAL N/A 01/18/2022   Procedure: EXCISION OF SEBACEOUS CYSTS CHEST AND NECK;  Surgeon: Vanderbilt Ned, MD;  Location: [facility];  Service: General;  Laterality: N/A;   PH IMPEDANCE STUDY N/A 11/04/2015   Procedure: PH IMPEDANCE STUDY;  Surgeon: Lynwood Celestia, MD;  Location: [facility];  Service: Endoscopy;  Laterality: N/A;   VAGINAL HYSTERECTOMY  2010   TVH--ovaries remain  Patient Active Problem List   Diagnosis Date Noted   Sjogren syndrome with keratoconjunctivitis (HCC) 12/09/2022   Other hemochromatosis 06/10/2021   Elevated LFTs 04/04/2021   Colitis 04/04/2021   Bacterial overgrowth syndrome 05/21/2020   Obesity 05/21/2020   History of endometriosis 05/21/2020   Constipation due to outlet dysfunction 02/05/2020   Gastroesophageal reflux disease 02/05/2020   Obstructive sleep apnea treated with continuous positive airway pressure (CPAP) 06/16/2017   Perennial allergic rhinitis with a nonallergic component 01/31/2017   Asthmatic bronchitis 01/31/2017   GI symptoms 01/31/2017    Family history of colon cancer 01/27/2015   Chest pain 12/13/2012   Dyspnea 12/13/2012   DVT of lower extremity (deep venous thrombosis) (HCC) 01/03/1990    PCP: Gaile New, MD  REFERRING PROVIDER: Valery Ripple, MD  REFERRING DIAG: I89.0  THERAPY DIAG:  Lymphedema, not elsewhere classified  Rationale for Evaluation and Treatment: Rehabilitation  ONSET DATE: ~2014; exacerbation in 2023  SUBJECTIVE:                                                                                                                                                                                          SUBJECTIVE STATEMENT:  Pt presents for OT to address lower quadrant and LLE lymphedema and associated pain/ discomfort.  Pt continues to wear R long thumb spica splint and reports wrist pain 4/10. She is undergoing OT for her hand  1 x weekly.  Pt reports ongoing symptom relief,  including decreased gut and abdominal discomfort and improved bowel motility / peristalsis with abdominal MLD. OT and Pt have discussed teaching adult daughter these techniques, but this has not been scheduled yet. Pt gets port for Humira infusions later this week. Pt reports poor sleepp last night. She had hip pain when walking over the weekend  PERTINENT HISTORY: CVI, OSA (no CPAP), GERD, Esophageal stricture, IBS, Lactose intolerant, Diarrhea, Hx LE DVT  PAIN:  Are you having pain? Yes: 2/10  Pain location: LLE, digestive discomfort Pain description: myalgia, tight, heavy, tingling, numbness Aggravating factors: standing, walking, dependent sitting > 30 minutes Relieving factors: elevation  PRECAUTIONS: Other: LYMPHEDEMA; Hx LLE DVT  WEIGHT BEARING RESTRICTIONS: No  FALLS:  Has patient fallen in last 6 months? No  LIVING ENVIRONMENT: Lives with: lives with spouse and daughter Lives in: House/apartment Stairs: Yes; Internal: 14 steps; yes and External: 4 steps; yes Has following equipment at home:  None  OCCUPATION: [employer] operational officer, walking, desk work  LEISURE: family time  HAND DOMINANCE: right   PRIOR LEVEL OF FUNCTION: Independent  PATIENT GOALS: Feel better, be able to be more active without pain, wear preferred street  shoes  OBJECTIVE: Note: Objective measures were completed at Evaluation unless otherwise noted.  COGNITION:  Overall cognitive status: Within functional limits for tasks assessed   POSTURE: WFL  LE ROM: WFL  LE MMT: WFL  LYMPHEDEMA ASSESSMENTS: non-Ca related  INFECTIONS: denies cellulitis and wound hx  BLE COMPARATIVE LIMB VOLUMETRICS Initial 11/15/22  LANDMARK RIGHT (dominant)  R LEG (A-D) 3028.9 ml  R THIGH (E-G) 4809.60 ml  R FULL LIMB (A-G) 7838.5 ml  Limb Volume differential (LVD)  %  Volume change since initial %  Volume change overall V  (Blank rows = not tested)  LANDMARK LEFT  (Rx)  L LEG (A-D) 3189.9 ml  L THIGH (E-G) 4959.8 ml  L FULL LIMB (A-G) 8149.7 ml  Limb Volume differential (LVD)  LEG LVD = 5.32%, L>R; THIGH LVD = 3.12%, L>R; And full LLE LVD = 3.97%, L>R.   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  Skin  Description Hyper-Keratosis Peau d' Orange Shiny Tight Fibrotic/ Indurated Fatty Doughy Spongy/ boggy       R>L x  x   Skin dry Flaky WNL Macerated   mildly      Color Redness Varicosities Blanching Hemosiderin Stain Mottled        x   Odor Malodorous Yeast Fungal infection  WNL      x   Temperature Warm Cool wnl     x    Pitting Edema   1+ 2+ 3+ 4+ Non-pitting         x   Girth Symmetrical  Asymmetrical                   Distribution    L>R toes to groin    Stemmer Sign Positive Negative   +L -R   Lymphorrhea History Of:  Present Absent     x    Wounds History Of Present Absent Venous Arterial Pressure Sheer     x        Signs of Infection Redness Warmth Erythema Acute Swelling Drainage Borders                    Sensation Light Touch Deep pressure Hypersensitivity   In tact Impaired In tact Impaired Absent Impaired   x  x  x     Nails WNL   Fungus nail dystrophy   x     Hair Growth Symmetrical Asymmetrical   x    Skin Creases Base of toes  Ankles   Base of Fingers knees       Abdominal pannus Thigh Lobules  Face/neck   x           GAIT: Distance walked: >500' Assistive device utilized: None Level of assistance: Complete Independence Comments: Functional ambulation and transfers [facility]  LYMPHEDEMA LIFE IMPACT SCALE (LLIS): Initial 11/15/22  50%  FOTO functional outcome measure: Deferred. FOTO discontinued.  TODAY'S TREATMENT:  MLD to BLQ emphasis on colon and LLE Pt edu for le SELF CARE  PATIENT EDUCATION:  Continued Pt/ CG edu for how function of cisterna chyli, part of the thoracic duct of deep abdominal lymphatics, assists with digestion by filtering many of the fats and proteins from the digestive system. Sub optimal lymphatic flow in this region may result in constipation, bloating, swelling, etc. MLD helps mobilize these protein and fats , and the rhythmic movement of manual lymphatic drainage stimulates digestive muscles and increase peristalsis. Provided printed resource for reference.  Topics include outcome of comparative limb volumetrics- starting limb volume differentials (LVDs), technology and gradient techniques used for short stretch, multilayer compression wrapping, simple self-MLD, therapeutic  lymphatic pumping exercises, skin/nail care, LE precautions,. compression garment recommendations and specifications, wear and care schedule and compression garment donning / doffing w assistive devices. Discussed progress towards all OT goals since commencing CDT. All questions answered to the Pt's satisfaction. Good return. Person educated: Patient  Education method: Explanation, Demonstration, and Handouts Education comprehension: verbalized understanding, returned demonstration, verbal cues required, and needs further education  HOME EXERCISE PROGRAM: BLE lymphatic pumping there ex using- 1 sets of 10 reps, each exercise in order-  1-2 x daily, bilaterally Simple self MLD 1 x daily Daily skin care to increase hydration, skin mobility and decrease infection risk- can be done during MLD Compression wraps 23/7 during intensive phase of CDT Compression garments/ devices during self-management phase of CDT  ASSESSMENT:  CLINICAL IMPRESSION:    Pt continues to tolerate and respond well to MLD accessing deep abdominal lymphatics and the ascending, transverse and descending colon as evidenced by reduced gut discomfort and constipation. MLD to abdominal lymphatics is known to increase peristalsis and to stimulate the parasympathetic nervous system promoting relaxation of the intestinal sphincter muscles. Pt has also been able to suspend frequent intestinal cleanses, reports decreased constipation and more regular bowel movements with regular MLD. Pt reports recently when she missed a week of OT these symptoms worsened. Pt tolerated abdominal MLD to deep gut  lymphatics without increased pain.  Performed short neck sequence as established, then performed alternate pressure and release on descending, transverse and ascending colon combined with diaphragmatic breathing in supine with knees flexed. We completed J strokes starting at distal end of colon (descending), then transverse and finally ascending colon  region. Continue teaching simple self MLD and encourage family visit to clinic to learn MLD techniques. Cont  1 x weekly as per POC.  11/15/22 Initial Evaluation: Shawndell Varas is a 53 y.o. female presenting with very mild, stage II, LLE lymphedema 2/2 venous insufficiency and obesity. L Leg swelling and associated pain swelling fluctuates. It has progressed over time, and now no longer resolves with elevation over night. RLE swelling limits balance during functional ambulation. It exacerbates infection and falls risk. LLE/RLQ lymphedema interferes with functional performance in all occupational domains, including basic and instrumental ADLs, productive activities, leisure pursuits and social participation. Pt will benefit from Occupational Therapy for Complete Decongestive Therapy (CDT) to restore function, to reduce physical and psychologic suffering associated with chronic, progressive lymphedema and associated pain, and to limit infection. CDT will include manual lymphatic drainage (MLD), skin care, therapeutic exercise and compression wraps and garment's devices. Without skilled OT for lymphedema care the condition will worsen and further functional decline is expected  Custom-made gradient compression garments and HOS devices are medically necessary because they are uniquely sized and shaped to fit the exact dimensions of the affected extremities, and to  provide appropriate medical grade, graduated compression essential for optimally managing chronic, progressive lymphedema. Multiple custom compression garments are needed to ensure proper hygiene to limit infection risk. Custom compression garments should be replaced q 3-6 months When worn consistently for optimal lipo-lymphedema self-management over time. HOS devices, medically necessary to limit fibrosis buildup in tissue, should be replaced q 2 years and PRN when worn out.      OBJECTIVE IMPAIRMENTS: decreased activity tolerance, decreased knowledge  of condition, decreased knowledge of use of DME, increased edema, impaired sensation, pain, and chronic, progressive leg swelling.   ACTIVITY LIMITATIONS: dependent sitting, extended standing and/ or walking, squatting, and lower body dressing, fitting preferred street shoes  PARTICIPATION LIMITATIONS: shopping, community activity, occupation, and yard work  PERSONAL FACTORS: 3+ comorbidities: Hx DVT,  OSA, CVI. Varicose veins are also affecting patient's functional outcome.   REHAB POTENTIAL: Good  EVALUATION COMPLEXITY: Moderate   GOALS: Goals reviewed with patient? Yes  SHORT TERM GOALS: Target date: 4th OT Rx visit  SHORT TERM GOALS: Target date: 4th OT Rx visit   Pt will demonstrate understanding of lymphedema precautions and prevention strategies with modified independence using a printed reference to identify at least 5 precautions and discussing how s/he may implement them into daily life to reduce risk of progression with extra time. Baseline:Max A Goal status: GOAL MET   Pt will be able to apply multilayer, knee length, gradient, compression wraps to one leg at a time with modified assistance (extra time and assistive device/s) to decrease limb volume, to limit infection risk, and to limit lymphedema progression.  Baseline: Dependent Goal status: GOAL DISCHARGED. Pt Going to compression garments instead  LONG TERM GOALS: Target date: 02/08/23  Given this patient's Intake score of 67%/100% on the functional outcomes FOTO tool, patient will experience an increase in function of 5 points to improve basic and instrumental ADLs performance, including lymphedema self-care.  Baseline: Max A Goal status:GOAL DISCHARGED.  FOTO TOOL DISCONTINUED AT CLINIC   Given this patient's Intake score of 50% on the Lymphedema Life Impact Scale (LLIS), patient will experience a reduction of at least 5 points in her perceived level of functional impairment resulting from lymphedema to improve  functional performance and quality of life (QOL). Baseline: 50% Goal status: PROGRESSING  Pt will achieve at least a 10% volume reduction in B legs to return limb to typical size and shape, to limit infection risk and LE progression, to decrease pain, to improve function. Baseline: Dependent Goal status: PROGRESSING  4.  Pt will obtain proper compression garments/devices and achieve modified independence (extra time + assistive devices) with donning/doffing to optimize limb volume reductions and limit LE  progression over time. Baseline:  Goal status:GOAL MET  5.  During Intensive phase CDT , with modified independence, Pt will achieve at least 85% compliance with all lymphedema self-care home program components, including daily skin care, compression wraps and /or garments, simple self MLD and lymphatic pumping therex to habituate LE self care protocol  into ADLs for optimal LE self-management over time. Baseline: Dependent Goal status:GOAL MET   PLAN:  OT FREQUENCY: 1 /week  OT DURATION: 12 weeks and PRN  PLANNED INTERVENTIONS: 97110-Therapeutic exercises, 97530- Therapeutic activity, 97535- Self Care, 02859- Manual therapy, Patient/Family education, Manual lymph drainage, Compression bandaging, and fit with appropriate compression garments  PLAN FOR NEXT SESSION:  Cont MLD as established Pt and family edu for LE self-care  Zebedee Dec, MS, OTR/L, CLT-LANA 07/04/23 2:21 PM

## 2023-07-05 ENCOUNTER — Encounter (HOSPITAL_COMMUNITY): Payer: Self-pay | Admitting: Radiology

## 2023-07-05 ENCOUNTER — Other Ambulatory Visit: Payer: Self-pay | Admitting: Radiology

## 2023-07-05 DIAGNOSIS — M25531 Pain in right wrist: Secondary | ICD-10-CM | POA: Diagnosis not present

## 2023-07-05 NOTE — H&P (Signed)
 Chief Complaint: Hemachromatosis; poor venous access with phlebotomy needs - image guided port a catheter placement   Referring Provider(s): Timmy Coy   Supervising Physician: Luverne Aran  Patient Status: [facility] - Out-pt  History of Present Illness: Karen [name] is a 53 y.o. female with history of adenomyosis, anemia, arthritis, G6PD deficiency, DVT, and hemachromatosis.  Pt is followed by Dr. Timmy of hematology/oncology who manages her hemachromatosis by phlebotomy.  The pt has poor venous access and was referred to interventional radiology for image guided port a catheter placement.    *** Patient is Full Code  Past Medical History:  Diagnosis Date   Adenomyosis    Anemia    Arthritis 2013   L knee gets steroid injections    Asthmatic bronchitis 01/31/2017   Back pain    Chronic constipation    Diarrhea    DVT of lower extremity (deep venous thrombosis) (HCC)    Dysmenorrhea    Endometriosis    Esophageal stricture    G6PD deficiency    GERD (gastroesophageal reflux disease)    History of kidney stones    History of uterine fibroid    IBS (irritable bowel syndrome)    Interstitial cystitis    Lactose intolerance    Migraine    with aura   Other hemochromatosis 06/10/2021   PONV (postoperative nausea and vomiting)    Recurrent upper respiratory infection (URI)    Sebaceous cyst of breast, right lower inner quadrant 10/25/2012   Excised 11/21/12    Sleep apnea    Syncope, non cardiac     Past Surgical History:  Procedure Laterality Date   ARTHROSCOPIC HAGLUNDS REPAIR     BREAST CYST EXCISION Right 11/21/2012   Procedure: CYST EXCISION BREAST;  Surgeon: Sherlean JINNY Laughter, MD;  Location: [facility];  Service: General;  Laterality: Right;   BREAST CYST EXCISION Bilateral 01/18/2022   Procedure: EXCISION OF SEBACEOUS CYST BILATERAL BREAST;  Surgeon: Vanderbilt Ned, MD;  Location: [facility];  Service: General;   Laterality: Bilateral;   BREAST EXCISIONAL BIOPSY Right 11/2012   CESAREAN SECTION  04/19/1993   CHOLECYSTECTOMY     COLONOSCOPY  05/02/2011   Procedure: COLONOSCOPY;  Surgeon: Lynwood LITTIE Celestia Mickey., MD;  Location: [facility];  Service: Endoscopy;  Laterality: N/A;   colonoscopy  11/04/2014   DENTAL SURGERY  03/06/2018   2 surgeries ( 03/06/2018 and 04/06/2018)   to remove two separate benign tumors   ESOPHAGEAL MANOMETRY N/A 11/04/2015   Procedure: ESOPHAGEAL MANOMETRY (EM);  Surgeon: Lynwood Celestia, MD;  Location: [facility];  Service: Endoscopy;  Laterality: N/A;   ESOPHAGOGASTRODUODENOSCOPY  05/02/2011   Procedure: ESOPHAGOGASTRODUODENOSCOPY (EGD);  Surgeon: Lynwood LITTIE Celestia Mickey., MD;  Location: [facility];  Service: Endoscopy;  Laterality: N/A;   ESOPHAGOGASTRODUODENOSCOPY N/A 09/01/2014   Procedure: ESOPHAGOGASTRODUODENOSCOPY (EGD);  Surgeon: Lynwood Celestia, MD;  Location: [facility];  Service: Endoscopy;  Laterality: N/A;   KNEE SURGERY     LAPAROSCOPY     x 4   LESION REMOVAL N/A 01/18/2022   Procedure: EXCISION OF SEBACEOUS CYSTS CHEST AND NECK;  Surgeon: Vanderbilt Ned, MD;  Location: [facility];  Service: General;  Laterality: N/A;   PH IMPEDANCE STUDY N/A 11/04/2015   Procedure: PH IMPEDANCE STUDY;  Surgeon: Lynwood Celestia, MD;  Location: [facility];  Service: Endoscopy;  Laterality: N/A;   VAGINAL HYSTERECTOMY  2010   TVH--ovaries remain    Allergies: Molds & smuts, Alpha-gal, and  Methotrexate   Medications: Prior to Admission medications   Medication Sig Start Date End Date Taking? Authorizing Provider  Acetaminophen  500 MG capsule 1 capsule as needed Orally every 6 hrs    [provider]  adalimumab (HUMIRA, 2 PEN,) 40 MG/0.4ML pen 0.4 mL Subcutaneous EVERY 2 WEEKS for 30 days Patient not taking: Reported on 07/03/2023 06/26/23   [provider]  albuterol  (VENTOLIN  HFA) 108 (90 Base) MCG/ACT inhaler Inhale 2 puffs into the lungs every 6  (six) hours as needed for wheezing or shortness of breath. 12/08/22   Iva Marty Saltness, MD  Elderberry 575 MG/5ML SYRP Take 30 mLs by mouth daily.    [provider]  fluticasone  (FLONASE ) 50 MCG/ACT nasal spray Place 2 sprays into both nostrils daily. 04/19/23   Karis Clunes, MD  Lactobacillus Casei-Folic Acid  (RESTORA RX) 60-1.25 MG CAPS Take 1 capsule by mouth every morning. 11/14/22     lansoprazole  (PREVACID ) 30 MG capsule Take 1 capsule (30 mg total) by mouth daily. 06/29/23     levocetirizine (XYZAL ) 5 MG tablet Take 1 tablet (5 mg total) by mouth every evening. 12/08/22   Iva Marty Saltness, MD  Lifitegrast  (XIIDRA ) 5 % SOLN Place 1 drop into both eyes 2 (two) times daily. 10/12/22     ondansetron  (ZOFRAN -ODT) 4 MG disintegrating tablet Dissolve 1 tablet (4 mg total) in mouth every 8 (eight) hours as needed for nausea 03/03/22     ondansetron  (ZOFRAN -ODT) 4 MG disintegrating tablet Take 1 tablet (4 mg total) by mouth every 8 (eight) hours as needed for nausea. 06/29/23     polyethylene glycol (MIRALAX  / GLYCOLAX ) 17 g packet Take 17 g by mouth every other day.    [provider]  rizatriptan  (MAXALT ) 5 MG tablet Take 1 tablet (5 mg total) by mouth as needed for migraine. May repeat in 2 hours if needed, no more than 2 pills/24 hours, no more than 3 pills/week. 11/10/22   Athar, Saima, MD  triamcinolone  ointment (KENALOG ) 0.1 % Apply pea sized amount to affected vulvar and perianal area three times per week. Increase to twice daily for flares. 05/05/23     UNABLE TO FIND Med Name: NIFEDIPINE 0.3%/LIDO1.5% OINT    [provider]     Family History  Problem Relation Age of Onset   Colon cancer Father 31   Hypertension Father    Stroke Father    Heart disease Father    Heart attack Father    Hyperlipidemia Father    Colon cancer Mother 14   Diabetes Maternal Uncle    Stroke Paternal Grandmother    Heart attack Paternal Grandmother    Heart attack Maternal  Grandmother    Heart attack Maternal Grandfather    Cancer Maternal Uncle    Hypertension Sister    Diabetes Brother    Breast cancer Neg Hx     Social History   Socioeconomic History   Marital status: Married    Spouse name: Lynwood   Number of children: 2   Years of education: Not on file   Highest education level: Not on file  Occupational History   Occupation: Financial risk analyst  Tobacco Use   Smoking status: Never    Passive exposure: Never   Smokeless tobacco: Never  Vaping Use   Vaping status: Never Used  Substance and Sexual Activity   Alcohol use: Not Currently   Drug use: No   Sexual activity: Yes    Partners: Male  Birth control/protection: None, Surgical    Comment: TVH-ovaries remain  Other Topics Concern   Not on file  Social History Narrative   Are you right handed or left handed? Right Handed    Are you currently employed ? 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 No   Who lives with you? Husband and daughters   What type of home do you live in: 1 story or 2 story? Lives in a two story home    Caffiene 3 times week.  decaffienated mostly       Social Drivers of Corporate investment banker Strain: Not on file  Food Insecurity: No Food Insecurity (04/30/2020)   Hunger Vital Sign    Worried About Running Out of Food in the Last Year: Never true    Ran Out of Food in the Last Year: Never true  Transportation Needs: Not on file  Physical Activity: Not on file  Stress: Not on file  Social Connections: Not on file     Review of Systems: A 12 point ROS discussed and pertinent positives are indicated in the HPI above.  All other systems are negative.  Review of Systems  Vital Signs: There were no vitals taken for this visit.  Advance Care Plan: The advanced care place/surrogate decision maker was discussed at the time of visit and the patient did not wish to discuss or was not able to  name a surrogate decision maker or provide an advance care plan.  Physical Exam  Imaging: No results found.  Labs:  CBC: Recent Labs    12/29/22 1244 02/24/23 0913 05/18/23 1337 06/20/23 1215  WBC 4.5 4.1 4.8 4.7  HGB 12.1 12.7 12.1 12.0  HCT 36.9 39.0 37.4 37.3  PLT 182 203 203 203    COAGS: Recent Labs    11/08/22 0042  INR 1.0  APTT 28    BMP: Recent Labs    12/29/22 1244 02/24/23 0913 05/18/23 1337 06/20/23 1215  NA 139 141 141 140  K 4.0 4.3 4.5 4.1  CL 105 105 105 103  CO2 26 30 30 30   GLUCOSE 104* 110* 117* 74  BUN 14 11 12 13   CALCIUM 9.4 10.0 9.7 9.4  CREATININE 0.72 0.75 0.77 0.75  GFRNONAA >60 >60 >60 >60    LIVER FUNCTION TESTS: Recent Labs    12/29/22 1244 02/24/23 0913 05/18/23 1337 06/20/23 1215  BILITOT 0.5 0.5 0.6 0.5  AST 13* 16 17 17   ALT 12 22 17 28   ALKPHOS 120 112 102 113  PROT 6.9 7.2 7.1 7.2  ALBUMIN 4.1 4.3 4.8 4.5    TUMOR MARKERS: No results for input(s): AFPTM, CEA, CA199, CHROMGRNA in the last 8760 hours.  Assessment and Plan:  Pt is with Hemachromatosis; poor venous access with phlebotomy needs scheduled for image guided port a catheter placement 07/06/23.    Risks and benefits of image guided port-a-catheter placement was discussed with the patient including, but not limited to bleeding, infection, pneumothorax, or fibrin sheath development and need for additional procedures.  All of the patient's questions were answered, patient is agreeable to proceed. Consent signed and in chart.  Thank you for allowing our service to participate in Karen [name] 's care.  Electronically Signed: Lavanda JAYSON Jurist, PA-C   07/05/2023, 9:40 AM    I spent a total of  30 Minutes   in face to face in  clinical consultation, greater than 50% of which was counseling/coordinating care for image guided port a catheter placement.

## 2023-07-06 ENCOUNTER — Encounter (HOSPITAL_COMMUNITY): Payer: Self-pay

## 2023-07-06 ENCOUNTER — Other Ambulatory Visit: Payer: Self-pay

## 2023-07-06 ENCOUNTER — Ambulatory Visit (HOSPITAL_COMMUNITY)
Admission: RE | Admit: 2023-07-06 | Discharge: 2023-07-06 | Disposition: A | Discharge status: Home / Self Care | Source: Ambulatory Visit | Attending: Hematology & Oncology | Admitting: Hematology & Oncology

## 2023-07-06 DIAGNOSIS — I878 Other specified disorders of veins: Secondary | ICD-10-CM | POA: Insufficient documentation

## 2023-07-06 DIAGNOSIS — Z86718 Personal history of other venous thrombosis and embolism: Secondary | ICD-10-CM | POA: Insufficient documentation

## 2023-07-06 DIAGNOSIS — Z452 Encounter for adjustment and management of vascular access device: Secondary | ICD-10-CM | POA: Diagnosis not present

## 2023-07-06 DIAGNOSIS — N8003 Adenomyosis of the uterus: Secondary | ICD-10-CM | POA: Diagnosis not present

## 2023-07-06 HISTORY — PX: IR IMAGING GUIDED PORT INSERTION: IMG5740

## 2023-07-06 HISTORY — DX: Rheumatic fever without heart involvement: I00

## 2023-07-06 MED ORDER — SODIUM CHLORIDE 0.9 % IV SOLN: INTRAVENOUS | Status: DC

## 2023-07-06 MED ORDER — FENTANYL CITRATE (PF) 100 MCG/2ML IJ SOLN
INTRAMUSCULAR | Status: AC
  Filled 2023-07-06: qty 2

## 2023-07-06 MED ORDER — MIDAZOLAM HCL 2 MG/2ML IJ SOLN
INTRAMUSCULAR | Status: AC | PRN
  Administered 2023-07-06: 1 mg via INTRAVENOUS

## 2023-07-06 MED ORDER — FENTANYL CITRATE (PF) 100 MCG/2ML IJ SOLN
INTRAMUSCULAR | Status: AC | PRN
  Administered 2023-07-06: 50 ug via INTRAVENOUS

## 2023-07-06 MED ORDER — LIDOCAINE HCL 1 % IJ SOLN
20.0000 mL | Freq: Once | INTRAMUSCULAR | Status: AC
  Administered 2023-07-06: 15 mL via INTRADERMAL

## 2023-07-06 MED ORDER — LIDOCAINE HCL 1 % IJ SOLN
INTRAMUSCULAR | Status: AC
  Filled 2023-07-06: qty 20

## 2023-07-06 MED ORDER — ONDANSETRON HCL 4 MG/2ML IJ SOLN
4.0000 mg | INTRAMUSCULAR | Status: AC
  Administered 2023-07-06: 4 mg via INTRAVENOUS

## 2023-07-06 MED ORDER — ONDANSETRON HCL 4 MG/2ML IJ SOLN
INTRAMUSCULAR | Status: AC
  Filled 2023-07-06: qty 2

## 2023-07-06 MED ORDER — MIDAZOLAM HCL 2 MG/2ML IJ SOLN
INTRAMUSCULAR | Status: AC
  Filled 2023-07-06: qty 2

## 2023-07-06 NOTE — Procedures (Signed)
 Interventional Radiology Procedure Note  Procedure: Single Lumen Power Port Placement    Access:  Right IJ vein.  Findings: Catheter tip positioned at SVC/RA junction. Port is ready for immediate use.   Complications: None  EBL: < 10 mL  Recommendations:  - Ok to shower in 24 hours - Do not submerge for 7 days - Routine line care   Maxi Carreras T. Fredia Sorrow, M.D Pager:  [phone]

## 2023-07-06 NOTE — Progress Notes (Signed)
 1230 NS at 40 cc per hour placed in left forearm,dressing clean,dry and intact. 1600 NS discontinuted.

## 2023-07-06 NOTE — Discharge Instructions (Signed)
 Discharge Instructions:   Please call Interventional Radiology clinic [phone] with any questions or concerns.  You may remove your dressing and shower tomorrow.  Do not use EMLA / Lidocaine  cream for 2 weeks post Port Insertion this will remove the surgical clue.   Moderate Conscious Sedation, Adult, Care After  This sheet gives you information about how to care for yourself after your procedure. Your health care provider may also give you more specific instructions. If you have problems or questions, contact your health care provider. What can I expect after the procedure? After the procedure, it is common to have: Sleepiness for several hours. Impaired judgment for several hours. Difficulty with balance. Vomiting if you eat too soon. Follow these instructions at home: For the time period you were told by your health care provider: Rest. Do not participate in activities where you could fall or become injured. Do not drive or use machinery. Do not drink alcohol. Do not take sleeping pills or medicines that cause drowsiness. Do not make important decisions or sign legal documents. Do not take care of children on your own. Eating and drinking  Follow the diet recommended by your health care provider. Drink enough fluid to keep your urine pale yellow. If you vomit: Drink water, juice, or soup when you can drink without vomiting. Make sure you have little or no nausea before eating solid foods. General instructions Take over-the-counter and prescription medicines only as told by your health care provider. Have a responsible adult stay with you for the time you are told. It is important to have someone help care for you until you are awake and alert. Do not smoke. Keep all follow-up visits as told by your health care provider. This is important. Contact a health care provider if: You are still sleepy or having trouble with balance after 24 hours. You feel light-headed. You  keep feeling nauseous or you keep vomiting. You develop a rash. You have a fever. You have redness or swelling around the IV site. Get help right away if: You have trouble breathing. You have new-onset confusion at home. Summary After the procedure, it is common to feel sleepy, have impaired judgment, or feel nauseous if you eat too soon. Rest after you get home. Know the things you should not do after the procedure. Follow the diet recommended by your health care provider and drink enough fluid to keep your urine pale yellow. Get help right away if you have trouble breathing or new-onset confusion at home. This information is not intended to replace advice given to you by your health care provider. Make sure you discuss any questions you have with your health care provider. Document Revised: 04/19/2019 Document Reviewed: 11/15/2018 Elsevier Patient Education  2023 [employer]   Implanted [place] Insertion, Care After  The following information offers guidance on how to care for yourself after your procedure. Your health care provider may also give you more specific instructions. If you have problems or questions, contact your health care provider. What can I expect after the procedure? After the procedure, it is common to have: Discomfort at the port insertion site. Bruising on the skin over the port. This should improve over 3-4 days. Follow these instructions at home: [facility] care After your port is placed, you will get a manufacturer's information card. The card has information about your port. Keep this card with you at all times. Take care of the port as told by your health care provider. Ask your health care  provider if you or a family member can get training for taking care of the port at home. A home health care nurse will be be available to help care for the port. Make sure to remember what type of port you have. Incision care     Follow instructions from your health care  provider about how to take care of your port insertion site. Make sure you: Wash your hands with soap and water for at least 20 seconds before and after you change your bandage (dressing). If soap and water are not available, use hand sanitizer. Change your dressing as told by your health care provider. Leave stitches (sutures), skin glue, or adhesive strips in place. These skin closures may need to stay in place for 2 weeks or longer. If adhesive strip edges start to loosen and curl up, you may trim the loose edges. Do not remove adhesive strips completely unless your health care provider tells you to do that. Check your port insertion site every day for signs of infection. Check for: Redness, swelling, or pain. Fluid or blood. Warmth. Pus or a bad smell. Activity Return to your normal activities as told by your health care provider. Ask your health care provider what activities are safe for you. You may have to avoid lifting. Ask your health care provider how much you can safely lift. General instructions Take over-the-counter and prescription medicines only as told by your health care provider. Do not take baths, swim, or use a hot tub until your health care provider approves. Ask your health care provider if you may take showers. You may only be allowed to take sponge baths. If you were given a sedative during the procedure, it can affect you for several hours. Do not drive or operate machinery until your health care provider says that it is safe. Wear a medical alert bracelet in case of an emergency. This will tell any health care providers that you have a port. Keep all follow-up visits. This is important. Contact a health care provider if: You cannot flush your port with saline as directed, or you cannot draw blood from the port. You have a fever or chills. You have redness, swelling, or pain around your port insertion site. You have fluid or blood coming from your port insertion  site. Your port insertion site feels warm to the touch. You have pus or a bad smell coming from the port insertion site. Get help right away if: You have chest pain or shortness of breath. You have bleeding from your port that you cannot control. These symptoms may be an emergency. Get help right away. Call 911. Do not wait to see if the symptoms will go away. Do not drive yourself to the hospital. Summary Take care of the port as told by your health care provider. Keep the manufacturer's information card with you at all times. Change your dressing as told by your health care provider. Contact a health care provider if you have a fever or chills or if you have redness, swelling, or pain around your port insertion site. Keep all follow-up visits. This information is not intended to replace advice given to you by your health care provider. Make sure you discuss any questions you have with your health care provider. Document Revised: 06/23/2020 Document Reviewed: 06/23/2020 Elsevier Patient Education  2023 [employer].

## 2023-07-10 ENCOUNTER — Other Ambulatory Visit: Payer: Self-pay | Admitting: *Deleted

## 2023-07-10 ENCOUNTER — Ambulatory Visit: Admitting: Occupational Therapy

## 2023-07-10 DIAGNOSIS — I890 Lymphedema, not elsewhere classified: Secondary | ICD-10-CM

## 2023-07-10 NOTE — Therapy (Signed)
 OUTPATIENT OCCUPATIONAL THERAPY TREATMENT NOTE   BLE/ BLQ LYMPHEDEMA  Patient Name: FLORIENE JESCHKE MRN: [id number] DOB:Jan 23, 1970, 53 y.o., female Today's Date: 07/10/2023  END OF SESSION:   OT End of Session - 07/10/23 0837     Visit Number 48    Number of Visits 72    Date for OT Re-Evaluation 08/17/23    OT Start Time 0830    OT Stop Time 0930    OT Time Calculation (min) 60 min    Activity Tolerance Patient tolerated treatment well;No increased pain            Past Medical History:  Diagnosis Date   Adenomyosis    Anemia    Arthritis 2013   L knee gets steroid injections    Arthritis, rheumatic, acute or subacute    Asthmatic bronchitis 01/31/2017   Back pain    Chronic constipation    Diarrhea    DVT of lower extremity (deep venous thrombosis) (HCC)    Dysmenorrhea    Endometriosis    Esophageal stricture    G6PD deficiency    GERD (gastroesophageal reflux disease)    History of kidney stones    History of uterine fibroid    IBS (irritable bowel syndrome)    Interstitial cystitis    Lactose intolerance    Migraine    with aura   Other hemochromatosis 06/10/2021   PONV (postoperative nausea and vomiting)    Recurrent upper respiratory infection (URI)    Sebaceous cyst of breast, right lower inner quadrant 10/25/2012   Excised 11/21/12    Sleep apnea    Syncope, non cardiac    Past Surgical History:  Procedure Laterality Date   ARTHROSCOPIC HAGLUNDS REPAIR     BREAST CYST EXCISION Right 11/21/2012   Procedure: CYST EXCISION BREAST;  Surgeon: Sherlean JINNY Laughter, MD;  Location: [facility];  Service: General;  Laterality: Right;   BREAST CYST EXCISION Bilateral 01/18/2022   Procedure: EXCISION OF SEBACEOUS CYST BILATERAL BREAST;  Surgeon: Vanderbilt Ned, MD;  Location: [facility];  Service: General;  Laterality: Bilateral;   BREAST EXCISIONAL BIOPSY Right 11/2012   CESAREAN SECTION  04/19/1993   CHOLECYSTECTOMY      COLONOSCOPY  05/02/2011   Procedure: COLONOSCOPY;  Surgeon: Lynwood LITTIE Celestia Mickey., MD;  Location: [facility];  Service: Endoscopy;  Laterality: N/A;   colonoscopy  11/04/2014   DENTAL SURGERY  03/06/2018   2 surgeries ( 03/06/2018 and 04/06/2018)   to remove two separate benign tumors   ESOPHAGEAL MANOMETRY N/A 11/04/2015   Procedure: ESOPHAGEAL MANOMETRY (EM);  Surgeon: Lynwood Celestia, MD;  Location: [facility];  Service: Endoscopy;  Laterality: N/A;   ESOPHAGOGASTRODUODENOSCOPY  05/02/2011   Procedure: ESOPHAGOGASTRODUODENOSCOPY (EGD);  Surgeon: Lynwood LITTIE Celestia Mickey., MD;  Location: [facility];  Service: Endoscopy;  Laterality: N/A;   ESOPHAGOGASTRODUODENOSCOPY N/A 09/01/2014   Procedure: ESOPHAGOGASTRODUODENOSCOPY (EGD);  Surgeon: Lynwood Celestia, MD;  Location: [facility];  Service: Endoscopy;  Laterality: N/A;   IR IMAGING GUIDED PORT INSERTION  07/06/2023   KNEE SURGERY     LAPAROSCOPY     x 4   LESION REMOVAL N/A 01/18/2022   Procedure: EXCISION OF SEBACEOUS CYSTS CHEST AND NECK;  Surgeon: Vanderbilt Ned, MD;  Location:  [facility];  Service: General;  Laterality: N/A;   PH IMPEDANCE STUDY N/A 11/04/2015   Procedure: PH IMPEDANCE STUDY;  Surgeon: Lynwood Celestia, MD;  Location: [facility];  Service: Endoscopy;  Laterality: N/A;   VAGINAL  HYSTERECTOMY  2010   TVH--ovaries remain   Patient Active Problem List   Diagnosis Date Noted   Sjogren syndrome with keratoconjunctivitis (HCC) 12/09/2022   Other hemochromatosis 06/10/2021   Elevated LFTs 04/04/2021   Colitis 04/04/2021   Bacterial overgrowth syndrome 05/21/2020   Obesity 05/21/2020   History of endometriosis 05/21/2020   Constipation due to outlet dysfunction 02/05/2020   Gastroesophageal reflux disease 02/05/2020   Obstructive sleep apnea treated with continuous positive airway pressure (CPAP) 06/16/2017   Perennial allergic rhinitis with a nonallergic component 01/31/2017   Asthmatic bronchitis 01/31/2017   GI  symptoms 01/31/2017   Family history of colon cancer 01/27/2015   Chest pain 12/13/2012   Dyspnea 12/13/2012   DVT of lower extremity (deep venous thrombosis) (HCC) 01/03/1990    PCP: Gaile New, MD  REFERRING PROVIDER: Valery Ripple, MD  REFERRING DIAG: I89.0  THERAPY DIAG:  Lymphedema, not elsewhere classified  Rationale for Evaluation and Treatment: Rehabilitation  ONSET DATE: ~2014; exacerbation in 2023  SUBJECTIVE:                                                                                                                                                                                          SUBJECTIVE STATEMENT:  Pt presents for OT to address lower quadrant, including deep abdominal lymphatics, and LLE lymphedema w/ associated pain. Pt got port for Humira infusions at R chest late last week. She reports 3/10 pain at port site . It's sore. Pt reports her L calf was painful and swollen after port placement procedure. It felt like some one was sitting on my leg. Pt encouraged to report these symptoms to her provider . A venous ultrasound documented in 2023 was negative for L DVT.  PERTINENT HISTORY: CVI, OSA (no CPAP), GERD, Esophageal stricture, IBS, Lactose intolerant, Diarrhea, Hx LE DVT, recent RA dx  PAIN:  Are you having pain? Yes: 3/10 - new port site Pain location: LLE, digestive discomfort Pain description: myalgia, tight, heavy, tingling, numbness Aggravating factors: standing, walking, dependent sitting > 30 minutes Relieving factors: elevation  PRECAUTIONS: Other: LYMPHEDEMA; Hx LLE DVT  WEIGHT BEARING RESTRICTIONS: No  FALLS:  Has patient fallen in last 6 months? No  LIVING ENVIRONMENT: Lives with: lives with spouse and daughter Lives in: House/apartment Stairs: Yes; Internal: 14 steps; yes and External: 4 steps; yes Has following equipment at home: None  OCCUPATION: [employer] operational officer, walking, desk work  LEISURE: family  time  HAND DOMINANCE: right   PRIOR LEVEL OF FUNCTION: Independent  PATIENT GOALS: Feel better, be able to be more active without pain, wear preferred street shoes  OBJECTIVE: Note: Objective  measures were completed at Evaluation unless otherwise noted.  COGNITION:  Overall cognitive status: Within functional limits for tasks assessed   POSTURE: WFL  LE ROM: WFL  LE MMT: WFL  LYMPHEDEMA ASSESSMENTS: non-Ca related  INFECTIONS: denies cellulitis and wound hx  BLE COMPARATIVE LIMB VOLUMETRICS Initial 11/15/22  LANDMARK RIGHT (dominant)  R LEG (A-D) 3028.9 ml  R THIGH (E-G) 4809.60 ml  R FULL LIMB (A-G) 7838.5 ml  Limb Volume differential (LVD)  %  Volume change since initial %  Volume change overall V  (Blank rows = not tested)  LANDMARK LEFT  (Rx)  L LEG (A-D) 3189.9 ml  L THIGH (E-G) 4959.8 ml  L FULL LIMB (A-G) 8149.7 ml  Limb Volume differential (LVD)  LEG LVD = 5.32%, L>R; THIGH LVD = 3.12%, L>R; And full LLE LVD = 3.97%, L>R.   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  Skin  Description Hyper-Keratosis Peau d' Orange Shiny Tight Fibrotic/ Indurated Fatty Doughy Spongy/ boggy       R>L x  x   Skin dry Flaky WNL Macerated   mildly      Color Redness Varicosities Blanching Hemosiderin Stain Mottled        x   Odor Malodorous Yeast Fungal infection  WNL      x   Temperature Warm Cool wnl     x    Pitting Edema   1+ 2+ 3+ 4+ Non-pitting         x   Girth Symmetrical Asymmetrical                   Distribution    L>R toes to groin    Stemmer  Sign Positive Negative   +L -R   Lymphorrhea History Of:  Present Absent     x    Wounds History Of Present Absent Venous Arterial Pressure Sheer     x        Signs of Infection Redness Warmth Erythema Acute Swelling Drainage Borders                    Sensation Light Touch Deep pressure Hypersensitivity   In tact Impaired In tact Impaired Absent Impaired   x  x  x     Nails WNL   Fungus nail dystrophy   x     Hair Growth Symmetrical Asymmetrical   x    Skin Creases Base of toes  Ankles   Base of Fingers knees       Abdominal pannus Thigh Lobules  Face/neck   x           GAIT: Distance walked: >500' Assistive device utilized: None Level of assistance: Complete Independence Comments: Functional ambulation and transfers [facility]  LYMPHEDEMA LIFE IMPACT SCALE (LLIS): Initial 11/15/22  50%  FOTO functional outcome measure: Deferred. FOTO discontinued.  TODAY'S TREATMENT:  MLD to BLQ emphasis on colon and LLE Pt edu for le SELF CARE  PATIENT EDUCATION:  Continued Pt/ CG edu for how function of cisterna chyli, part of the thoracic duct of deep abdominal lymphatics, assists with digestion by filtering many of the fats and proteins from the digestive system. Sub optimal lymphatic flow in this region may result in constipation, bloating, swelling, etc. MLD helps mobilize these protein and fats , and the rhythmic movement of manual lymphatic drainage stimulates digestive muscles and increase peristalsis. Provided printed resource for reference.  Topics include outcome of comparative limb volumetrics- starting limb volume differentials (LVDs), technology and gradient techniques used for short stretch, multilayer compression wrapping, simple self-MLD, therapeutic lymphatic pumping exercises, skin/nail care, LE precautions,. compression garment  recommendations and specifications, wear and care schedule and compression garment donning / doffing w assistive devices. Discussed progress towards all OT goals since commencing CDT. All questions answered to the Pt's satisfaction. Good return. Person educated: Patient  Education method: Explanation, Demonstration, and Handouts Education comprehension: verbalized understanding, returned demonstration, verbal cues required, and needs further education  HOME EXERCISE PROGRAM: BLE lymphatic pumping there ex using- 1 sets of 10 reps, each exercise in order-  1-2 x daily, bilaterally Simple self MLD 1 x daily Daily skin care to increase hydration, skin mobility and decrease infection risk- can be done during MLD Compression wraps 23/7 during intensive phase of CDT Compression garments/ devices during self-management phase of CDT  ASSESSMENT:  CLINICAL IMPRESSION:  No atypical LLE swelling noted today. No signs of redness and no pain with palpation during MLD. Pt mentioned that she slept in compression stockings after recent port placement because her L leg felt heavy and swollen. OT reminded Pt NOT to sleep in compression stockings as they can cut off lymph and blood circulation during periods of absent muscle pumping. Continued MLD to deep abdominal lymphatics and the ascending, transverse and descending colon aimed at reducing gut discomfort and constipation. To date, MLD to abdominal lymphatics has helped to stimulate peristalsis and to stimulate the parasympathetic nervous system. Pt has also been able to suspend frequent intestinal cleanses, reports decreased constipation and more regular bowel movements with regular MLD. Performed short neck sequence as established, then performed alternate pressure and release on descending, transverse and ascending colon combined with diaphragmatic breathing in supine with knees flexed. We completed J strokes starting at distal end of colon (descending), then  transverse and finally ascending colon region. Continue teaching simple self MLD and encourage family visit to clinic to learn MLD techniques. Cont  1 x weekly as per POC.  11/15/22 Initial Evaluation: Irelynd Zumstein is a 53 y.o. female presenting with very mild, stage II, LLE lymphedema 2/2 venous insufficiency and obesity. L Leg swelling and associated pain swelling fluctuates. It has progressed over time, and now no longer resolves with elevation over night. RLE swelling limits balance during functional ambulation. It exacerbates infection and falls risk. LLE/RLQ lymphedema interferes with functional performance in all occupational domains, including basic and instrumental ADLs, productive activities, leisure pursuits and social participation. Pt will benefit from Occupational Therapy for Complete Decongestive Therapy (CDT) to restore function, to reduce physical and psychologic suffering associated with chronic, progressive lymphedema and associated pain, and to limit infection. CDT will include manual lymphatic drainage (MLD), skin care, therapeutic exercise and compression wraps and garment's devices. Without skilled OT for lymphedema care the condition will worsen and further functional decline is expected  Custom-made gradient compression garments and HOS devices are  medically necessary because they are uniquely sized and shaped to fit the exact dimensions of the affected extremities, and to provide appropriate medical grade, graduated compression essential for optimally managing chronic, progressive lymphedema. Multiple custom compression garments are needed to ensure proper hygiene to limit infection risk. Custom compression garments should be replaced q 3-6 months When worn consistently for optimal lipo-lymphedema self-management over time. HOS devices, medically necessary to limit fibrosis buildup in tissue, should be replaced q 2 years and PRN when worn out.      OBJECTIVE IMPAIRMENTS: decreased  activity tolerance, decreased knowledge of condition, decreased knowledge of use of DME, increased edema, impaired sensation, pain, and chronic, progressive leg swelling.   ACTIVITY LIMITATIONS: dependent sitting, extended standing and/ or walking, squatting, and lower body dressing, fitting preferred street shoes  PARTICIPATION LIMITATIONS: shopping, community activity, occupation, and yard work  PERSONAL FACTORS: 3+ comorbidities: Hx DVT,  OSA, CVI. Varicose veins are also affecting patient's functional outcome.   REHAB POTENTIAL: Good  EVALUATION COMPLEXITY: Moderate   GOALS: Goals reviewed with patient? Yes  SHORT TERM GOALS: Target date: 4th OT Rx visit  SHORT TERM GOALS: Target date: 4th OT Rx visit   Pt will demonstrate understanding of lymphedema precautions and prevention strategies with modified independence using a printed reference to identify at least 5 precautions and discussing how s/he may implement them into daily life to reduce risk of progression with extra time. Baseline:Max A Goal status: GOAL MET   Pt will be able to apply multilayer, knee length, gradient, compression wraps to one leg at a time with modified assistance (extra time and assistive device/s) to decrease limb volume, to limit infection risk, and to limit lymphedema progression.  Baseline: Dependent Goal status: GOAL DISCHARGED. Pt Going to compression garments instead  LONG TERM GOALS: Target date: 02/08/23  Given this patient's Intake score of 67%/100% on the functional outcomes FOTO tool, patient will experience an increase in function of 5 points to improve basic and instrumental ADLs performance, including lymphedema self-care.  Baseline: Max A Goal status:GOAL DISCHARGED.  FOTO TOOL DISCONTINUED AT CLINIC   Given this patient's Intake score of 50% on the Lymphedema Life Impact Scale (LLIS), patient will experience a reduction of at least 5 points in her perceived level of functional impairment  resulting from lymphedema to improve functional performance and quality of life (QOL). Baseline: 50% Goal status: PROGRESSING  Pt will achieve at least a 10% volume reduction in B legs to return limb to typical size and shape, to limit infection risk and LE progression, to decrease pain, to improve function. Baseline: Dependent Goal status: PROGRESSING  4.  Pt will obtain proper compression garments/devices and achieve modified independence (extra time + assistive devices) with donning/doffing to optimize limb volume reductions and limit LE  progression over time. Baseline:  Goal status:GOAL MET  5.  During Intensive phase CDT , with modified independence, Pt will achieve at least 85% compliance with all lymphedema self-care home program components, including daily skin care, compression wraps and /or garments, simple self MLD and lymphatic pumping therex to habituate LE self care protocol  into ADLs for optimal LE self-management over time. Baseline: Dependent Goal status:GOAL MET   PLAN:  OT FREQUENCY: 1 /week  OT DURATION: 12 weeks and PRN  PLANNED INTERVENTIONS: 97110-Therapeutic exercises, 97530- Therapeutic activity, 97535- Self Care, [id number]- Manual therapy, Patient/Family education, Manual lymph drainage, Compression bandaging, and fit with appropriate compression garments  PLAN FOR NEXT SESSION:  Cont MLD as established Pt  and family edu for LE self-care  Zebedee Dec, MS, OTR/L, CLT-LANA 07/10/23 11:37 AM

## 2023-07-11 ENCOUNTER — Other Ambulatory Visit (HOSPITAL_COMMUNITY): Payer: Self-pay

## 2023-07-11 ENCOUNTER — Inpatient Hospital Stay

## 2023-07-11 ENCOUNTER — Other Ambulatory Visit: Payer: Self-pay

## 2023-07-11 ENCOUNTER — Inpatient Hospital Stay: Attending: Hematology & Oncology

## 2023-07-11 LAB — COMPREHENSIVE METABOLIC PANEL WITH GFR
ALT: 15 U/L (ref 0–44)
AST: 16 U/L (ref 15–41)
Albumin: 4.1 g/dL (ref 3.5–5.0)
Alkaline Phosphatase: 102 U/L (ref 38–126)
Anion gap: 7 (ref 5–15)
BUN: 10 mg/dL (ref 6–20)
CO2: 27 mmol/L (ref 22–32)
Calcium: 9.5 mg/dL (ref 8.9–10.3)
Chloride: 106 mmol/L (ref 98–111)
Creatinine, Ser: 0.64 mg/dL (ref 0.44–1.00)
GFR, Estimated: >60 mL/min (ref >60)
Glucose, Bld: 97 mg/dL (ref 70–99)
Potassium: 3.9 mmol/L (ref 3.5–5.1)
Sodium: 140 mmol/L (ref 135–145)
Total Bilirubin: 0.6 mg/dL (ref 0.0–1.2)
Total Protein: 6.9 g/dL (ref 6.5–8.1)

## 2023-07-11 LAB — CBC WITH DIFFERENTIAL (CANCER CENTER ONLY)
Abs Immature Granulocytes: 0.01 K/uL (ref 0.00–0.07)
Basophils Absolute: 0 K/uL (ref 0.0–0.1)
Basophils Relative: 0 %
Eosinophils Absolute: 0.1 K/uL (ref 0.0–0.5)
Eosinophils Relative: 2 %
HCT: 35.5 % — ABNORMAL LOW (ref 36.0–46.0)
Hemoglobin: 11.6 g/dL — ABNORMAL LOW (ref 12.0–15.0)
Immature Granulocytes: 0 %
Lymphocytes Relative: 29 %
Lymphs Abs: 1.4 K/uL (ref 0.7–4.0)
MCH: 32.5 pg (ref 26.0–34.0)
MCHC: 32.7 g/dL (ref 30.0–36.0)
MCV: 99.4 fL (ref 80.0–100.0)
Monocytes Absolute: 0.5 K/uL (ref 0.1–1.0)
Monocytes Relative: 10 %
Neutro Abs: 2.9 K/uL (ref 1.7–7.7)
Neutrophils Relative %: 59 %
Platelet Count: 184 K/uL (ref 150–400)
RBC: 3.57 MIL/uL — ABNORMAL LOW (ref 3.87–5.11)
RDW: 12.9 % (ref 11.5–15.5)
WBC Count: 4.9 K/uL (ref 4.0–10.5)
nRBC: 0 % (ref 0.0–0.2)

## 2023-07-11 LAB — FERRITIN: Ferritin: 173 ng/mL (ref 11–307)

## 2023-07-11 MED ORDER — SODIUM CHLORIDE 0.9 % IV SOLN: INTRAVENOUS | Status: DC

## 2023-07-11 MED ORDER — SODIUM CHLORIDE 0.9% FLUSH
10.0000 mL | INTRAVENOUS | Status: DC | PRN
  Administered 2023-07-11: 10 mL via INTRAVENOUS

## 2023-07-11 MED ORDER — LIDOCAINE-PRILOCAINE 2.5-2.5 % EX CREA
1.0000 | TOPICAL_CREAM | CUTANEOUS | 0 refills | Status: DC | PRN
  Filled 2023-07-11: qty 30, 5d supply, fill #0

## 2023-07-11 NOTE — Patient Instructions (Signed)

## 2023-07-11 NOTE — Progress Notes (Signed)
 Karen [name] presents today for phlebotomy per MD orders. Phlebotomy procedure started at 14:43 and ended at 15:01. 500 cc removed via port-a-cath Patient requested IV replacement fluids. Verbal order from Lauraine pepper, NP for 500 ml NS. Patient tolerated procedure well. Port was not flushed with heparin d/t alpha-gal documented under allergies.

## 2023-07-11 NOTE — Patient Instructions (Signed)

## 2023-07-12 ENCOUNTER — Encounter: Payer: Self-pay | Admitting: *Deleted

## 2023-07-12 ENCOUNTER — Ambulatory Visit: Admitting: Occupational Therapy

## 2023-07-12 ENCOUNTER — Ambulatory Visit: Payer: Self-pay | Admitting: Hematology & Oncology

## 2023-07-12 DIAGNOSIS — I890 Lymphedema, not elsewhere classified: Secondary | ICD-10-CM

## 2023-07-12 DIAGNOSIS — M79641 Pain in right hand: Secondary | ICD-10-CM | POA: Diagnosis not present

## 2023-07-12 DIAGNOSIS — M778 Other enthesopathies, not elsewhere classified: Secondary | ICD-10-CM | POA: Diagnosis not present

## 2023-07-12 DIAGNOSIS — M25531 Pain in right wrist: Secondary | ICD-10-CM | POA: Diagnosis not present

## 2023-07-12 LAB — IRON AND IRON BINDING CAPACITY (CC-WL,HP ONLY)
Iron: 62 ug/dL (ref 28–170)
Saturation Ratios: 21 % (ref 10.4–31.8)
TIBC: 301 ug/dL (ref 250–450)
UIBC: 239 ug/dL (ref 148–442)

## 2023-07-13 ENCOUNTER — Other Ambulatory Visit: Payer: Self-pay

## 2023-07-13 DIAGNOSIS — M25531 Pain in right wrist: Secondary | ICD-10-CM | POA: Diagnosis not present

## 2023-07-13 NOTE — Therapy (Signed)
 OUTPATIENT OCCUPATIONAL THERAPY TREATMENT NOTE   BLE/ BLQ LYMPHEDEMA  Patient Name: Karen [name] MRN: [id number] DOB:06-Oct-1970, 53 y.o., female Today's Date: 07/13/2023  END OF SESSION:   OT End of Session - 07/12/23 1410     Visit Number 49    Number of Visits 72    Date for OT Re-Evaluation 08/17/23    OT Start Time 0100    OT Stop Time 0200    OT Time Calculation (min) 60 min    Activity Tolerance Patient tolerated treatment well;No increased pain    Behavior During Therapy [facility] for tasks assessed/performed            Past Medical History:  Diagnosis Date   Adenomyosis    Anemia    Arthritis 2013   L knee gets steroid injections    Arthritis, rheumatic, acute or subacute    Asthmatic bronchitis 01/31/2017   Back pain    Chronic constipation    Diarrhea    DVT of lower extremity (deep venous thrombosis) (HCC)    Dysmenorrhea    Endometriosis    Esophageal stricture    G6PD deficiency    GERD (gastroesophageal reflux disease)    History of kidney stones    History of uterine fibroid    IBS (irritable bowel syndrome)    Interstitial cystitis    Lactose intolerance    Migraine    with aura   Other hemochromatosis 06/10/2021   PONV (postoperative nausea and vomiting)    Recurrent upper respiratory infection (URI)    Sebaceous cyst of breast, right lower inner quadrant 10/25/2012   Excised 11/21/12    Sleep apnea    Syncope, non cardiac    Past Surgical History:  Procedure Laterality Date   ARTHROSCOPIC HAGLUNDS REPAIR     BREAST CYST EXCISION Right 11/21/2012   Procedure: CYST EXCISION BREAST;  Surgeon: Sherlean JINNY Laughter, MD;  Location: [facility];  Service: General;  Laterality: Right;   BREAST CYST EXCISION Bilateral 01/18/2022   Procedure: EXCISION OF SEBACEOUS CYST BILATERAL BREAST;  Surgeon: Vanderbilt Ned, MD;  Location: [facility];  Service: General;  Laterality: Bilateral;   BREAST EXCISIONAL BIOPSY Right  11/2012   CESAREAN SECTION  04/19/1993   CHOLECYSTECTOMY     COLONOSCOPY  05/02/2011   Procedure: COLONOSCOPY;  Surgeon: Lynwood LITTIE Celestia Mickey., MD;  Location: [facility];  Service: Endoscopy;  Laterality: N/A;   colonoscopy  11/04/2014   DENTAL SURGERY  03/06/2018   2 surgeries ( 03/06/2018 and 04/06/2018)   to remove two separate benign tumors   ESOPHAGEAL MANOMETRY N/A 11/04/2015   Procedure: ESOPHAGEAL MANOMETRY (EM);  Surgeon: Lynwood Celestia, MD;  Location: [facility];  Service: Endoscopy;  Laterality: N/A;   ESOPHAGOGASTRODUODENOSCOPY  05/02/2011   Procedure: ESOPHAGOGASTRODUODENOSCOPY (EGD);  Surgeon: Lynwood LITTIE Celestia Mickey., MD;  Location: [facility];  Service: Endoscopy;  Laterality: N/A;   ESOPHAGOGASTRODUODENOSCOPY N/A 09/01/2014   Procedure: ESOPHAGOGASTRODUODENOSCOPY (EGD);  Surgeon: Lynwood Celestia, MD;  Location: [facility];  Service: Endoscopy;  Laterality: N/A;   IR IMAGING GUIDED PORT INSERTION  07/06/2023   KNEE SURGERY     LAPAROSCOPY     x 4   LESION REMOVAL N/A 01/18/2022   Procedure: EXCISION OF SEBACEOUS CYSTS CHEST AND NECK;  Surgeon: Vanderbilt Ned, MD;  Location:  [facility];  Service: General;  Laterality: N/A;   PH IMPEDANCE STUDY N/A 11/04/2015   Procedure: PH IMPEDANCE STUDY;  Surgeon: Lynwood Celestia, MD;  Location: [facility];  Service: Endoscopy;  Laterality: N/A;   VAGINAL HYSTERECTOMY  2010   TVH--ovaries remain   Patient Active Problem List   Diagnosis Date Noted   Sjogren syndrome with keratoconjunctivitis (HCC) 12/09/2022   Other hemochromatosis 06/10/2021   Elevated LFTs 04/04/2021   Colitis 04/04/2021   Bacterial overgrowth syndrome 05/21/2020   Obesity 05/21/2020   History of endometriosis 05/21/2020   Constipation due to outlet dysfunction 02/05/2020   Gastroesophageal reflux disease 02/05/2020   Obstructive sleep apnea treated with continuous positive airway pressure (CPAP) 06/16/2017   Perennial allergic rhinitis with a  nonallergic component 01/31/2017   Asthmatic bronchitis 01/31/2017   GI symptoms 01/31/2017   Family history of colon cancer 01/27/2015   Chest pain 12/13/2012   Dyspnea 12/13/2012   DVT of lower extremity (deep venous thrombosis) (HCC) 01/03/1990    PCP: Gaile New, MD  REFERRING PROVIDER: Valery Ripple, MD  REFERRING DIAG: I89.0  THERAPY DIAG:  Lymphedema, not elsewhere classified  Rationale for Evaluation and Treatment: Rehabilitation  ONSET DATE: ~2014; exacerbation in 2023  SUBJECTIVE:                                                                                                                                                                                          SUBJECTIVE STATEMENT:  Pt presents for OT to address lower quadrant, including deep abdominal lymphatics, and LLE lymphedema w/ associated pain. Pt got port for Humira infusions at R chest late last week. She reports 3/10 pain at port site . It's sore. Pt reports her L calf was painful and swollen after port placement procedure. It felt like some one was sitting on my leg. Pt encouraged to report these symptoms to her provider . A venous ultrasound documented in 2023 was negative for L DVT.  PERTINENT HISTORY: CVI, OSA (no CPAP), GERD, Esophageal stricture, IBS, Lactose intolerant, Diarrhea, Hx LE DVT, recent RA dx  PAIN:  Are you having pain? Yes: 3/10 - new port site Pain location: LLE, digestive discomfort Pain description: myalgia, tight, heavy, tingling, numbness Aggravating factors: standing, walking, dependent sitting > 30 minutes Relieving factors: elevation  PRECAUTIONS: Other: LYMPHEDEMA; Hx LLE DVT  WEIGHT BEARING RESTRICTIONS: No  FALLS:  Has patient fallen in last 6 months? No  LIVING ENVIRONMENT: Lives with: lives with spouse and daughter Lives in: House/apartment Stairs: Yes; Internal: 14 steps; yes and External: 4 steps; yes Has following equipment at home:  None  OCCUPATION: [employer] operational officer, walking, desk work  LEISURE: family time  HAND DOMINANCE: right   PRIOR LEVEL OF FUNCTION: Independent  PATIENT GOALS: Feel better, be able to be more active  without pain, wear preferred street shoes  OBJECTIVE: Note: Objective measures were completed at Evaluation unless otherwise noted.  COGNITION:  Overall cognitive status: Within functional limits for tasks assessed   POSTURE: WFL  LE ROM: WFL  LE MMT: WFL  LYMPHEDEMA ASSESSMENTS: non-Ca related  INFECTIONS: denies cellulitis and wound hx  BLE COMPARATIVE LIMB VOLUMETRICS Initial 11/15/22  LANDMARK RIGHT (dominant)  R LEG (A-D) 3028.9 ml  R THIGH (E-G) 4809.60 ml  R FULL LIMB (A-G) 7838.5 ml  Limb Volume differential (LVD)  %  Volume change since initial %  Volume change overall V  (Blank rows = not tested)  LANDMARK LEFT  (Rx)  L LEG (A-D) 3189.9 ml  L THIGH (E-G) 4959.8 ml  L FULL LIMB (A-G) 8149.7 ml  Limb Volume differential (LVD)  LEG LVD = 5.32%, L>R; THIGH LVD = 3.12%, L>R; And full LLE LVD = 3.97%, L>R.   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  Skin  Description Hyper-Keratosis Peau d' Orange Shiny Tight Fibrotic/ Indurated Fatty Doughy Spongy/ boggy       R>L x  x   Skin dry Flaky WNL Macerated   mildly      Color Redness Varicosities Blanching Hemosiderin Stain Mottled        x   Odor Malodorous Yeast Fungal infection  WNL      x   Temperature Warm Cool wnl     x    Pitting Edema   1+ 2+ 3+ 4+ Non-pitting         x   Girth Symmetrical  Asymmetrical                   Distribution    L>R toes to groin    Stemmer Sign Positive Negative   +L -R   Lymphorrhea History Of:  Present Absent     x    Wounds History Of Present Absent Venous Arterial Pressure Sheer     x        Signs of Infection Redness Warmth Erythema Acute Swelling Drainage Borders                    Sensation Light Touch Deep pressure Hypersensitivity   In tact Impaired In tact Impaired Absent Impaired   x  x  x     Nails WNL   Fungus nail dystrophy   x     Hair Growth Symmetrical Asymmetrical   x    Skin Creases Base of toes  Ankles   Base of Fingers knees       Abdominal pannus Thigh Lobules  Face/neck   x           GAIT: Distance walked: >500' Assistive device utilized: None Level of assistance: Complete Independence Comments: Functional ambulation and transfers [facility]  LYMPHEDEMA LIFE IMPACT SCALE (LLIS): Initial 11/15/22  50%  FOTO functional outcome measure: Deferred. FOTO discontinued.  TODAY'S TREATMENT:  MLD to BLQ emphasis on colon and LLE Pt edu for le SELF CARE  PATIENT EDUCATION:  Continued Pt/ CG edu for how function of cisterna chyli, part of the thoracic duct of deep abdominal lymphatics, assists with digestion by filtering many of the fats and proteins from the digestive system. Sub optimal lymphatic flow in this region may result in constipation, bloating, swelling, etc. MLD helps mobilize these protein and fats , and the rhythmic movement of manual lymphatic drainage stimulates digestive muscles and increase peristalsis. Provided printed resource for reference.  Topics include outcome of comparative limb volumetrics- starting limb volume differentials (LVDs), technology and gradient techniques used for short stretch, multilayer compression wrapping, simple self-MLD, therapeutic  lymphatic pumping exercises, skin/nail care, LE precautions,. compression garment recommendations and specifications, wear and care schedule and compression garment donning / doffing w assistive devices. Discussed progress towards all OT goals since commencing CDT. All questions answered to the Pt's satisfaction. Good return. Person educated: Patient  Education method: Explanation, Demonstration, and Handouts Education comprehension: verbalized understanding, returned demonstration, verbal cues required, and needs further education  HOME EXERCISE PROGRAM: BLE lymphatic pumping there ex using- 1 sets of 10 reps, each exercise in order-  1-2 x daily, bilaterally Simple self MLD 1 x daily Daily skin care to increase hydration, skin mobility and decrease infection risk- can be done during MLD Compression wraps 23/7 during intensive phase of CDT Compression garments/ devices during self-management phase of CDT  ASSESSMENT:  CLINICAL IMPRESSION:  No atypical LLE swelling noted today. No signs of redness and no pain with palpation during MLD. Pt mentioned that she slept in compression stockings after recent port placement because her L leg felt heavy and swollen. OT reminded Pt NOT to sleep in compression stockings as they can cut off lymph and blood circulation during periods of absent muscle pumping. Continued MLD to deep abdominal lymphatics and the ascending, transverse and descending colon aimed at reducing gut discomfort and constipation. To date, MLD to abdominal lymphatics has helped to stimulate peristalsis and to stimulate the parasympathetic nervous system. Pt has also been able to suspend frequent intestinal cleanses, reports decreased constipation and more regular bowel movements with regular MLD. Performed short neck sequence as established, then performed alternate pressure and release on descending, transverse and ascending colon combined with diaphragmatic breathing in supine with knees  flexed. We completed J strokes starting at distal end of colon (descending), then transverse and finally ascending colon region. Continue teaching simple self MLD and encourage family visit to clinic to learn MLD techniques. Cont  1 x weekly as per POC.  11/15/22 Initial Evaluation: Karen [name] is a 53 y.o. female presenting with very mild, stage II, LLE lymphedema 2/2 venous insufficiency and obesity. L Leg swelling and associated pain swelling fluctuates. It has progressed over time, and now no longer resolves with elevation over night. RLE swelling limits balance during functional ambulation. It exacerbates infection and falls risk. LLE/RLQ lymphedema interferes with functional performance in all occupational domains, including basic and instrumental ADLs, productive activities, leisure pursuits and social participation. Pt will benefit from Occupational Therapy for Complete Decongestive Therapy (CDT) to restore function, to reduce physical and psychologic suffering associated with chronic, progressive lymphedema and associated pain, and to limit infection. CDT will include manual lymphatic drainage (MLD), skin care, therapeutic exercise and compression wraps and garment's devices. Without skilled OT for lymphedema care the condition will worsen and further functional decline is expected  Custom-made gradient compression garments and HOS devices are  medically necessary because they are uniquely sized and shaped to fit the exact dimensions of the affected extremities, and to provide appropriate medical grade, graduated compression essential for optimally managing chronic, progressive lymphedema. Multiple custom compression garments are needed to ensure proper hygiene to limit infection risk. Custom compression garments should be replaced q 3-6 months When worn consistently for optimal lipo-lymphedema self-management over time. HOS devices, medically necessary to limit fibrosis buildup in tissue, should be  replaced q 2 years and PRN when worn out.      OBJECTIVE IMPAIRMENTS: decreased activity tolerance, decreased knowledge of condition, decreased knowledge of use of DME, increased edema, impaired sensation, pain, and chronic, progressive leg swelling.   ACTIVITY LIMITATIONS: dependent sitting, extended standing and/ or walking, squatting, and lower body dressing, fitting preferred street shoes  PARTICIPATION LIMITATIONS: shopping, community activity, occupation, and yard work  PERSONAL FACTORS: 3+ comorbidities: Hx DVT,  OSA, CVI. Varicose veins are also affecting patient's functional outcome.   REHAB POTENTIAL: Good  EVALUATION COMPLEXITY: Moderate   GOALS: Goals reviewed with patient? Yes  SHORT TERM GOALS: Target date: 4th OT Rx visit  SHORT TERM GOALS: Target date: 4th OT Rx visit   Pt will demonstrate understanding of lymphedema precautions and prevention strategies with modified independence using a printed reference to identify at least 5 precautions and discussing how s/he may implement them into daily life to reduce risk of progression with extra time. Baseline:Max A Goal status: GOAL MET   Pt will be able to apply multilayer, knee length, gradient, compression wraps to one leg at a time with modified assistance (extra time and assistive device/s) to decrease limb volume, to limit infection risk, and to limit lymphedema progression.  Baseline: Dependent Goal status: GOAL DISCHARGED. Pt Going to compression garments instead  LONG TERM GOALS: Target date: 02/08/23  Given this patient's Intake score of 67%/100% on the functional outcomes FOTO tool, patient will experience an increase in function of 5 points to improve basic and instrumental ADLs performance, including lymphedema self-care.  Baseline: Max A Goal status:GOAL DISCHARGED.  FOTO TOOL DISCONTINUED AT CLINIC   Given this patient's Intake score of 50% on the Lymphedema Life Impact Scale (LLIS), patient will experience  a reduction of at least 5 points in her perceived level of functional impairment resulting from lymphedema to improve functional performance and quality of life (QOL). Baseline: 50% Goal status: PROGRESSING  Pt will achieve at least a 10% volume reduction in B legs to return limb to typical size and shape, to limit infection risk and LE progression, to decrease pain, to improve function. Baseline: Dependent Goal status: PROGRESSING  4.  Pt will obtain proper compression garments/devices and achieve modified independence (extra time + assistive devices) with donning/doffing to optimize limb volume reductions and limit LE  progression over time. Baseline:  Goal status:GOAL MET  5.  During Intensive phase CDT , with modified independence, Pt will achieve at least 85% compliance with all lymphedema self-care home program components, including daily skin care, compression wraps and /or garments, simple self MLD and lymphatic pumping therex to habituate LE self care protocol  into ADLs for optimal LE self-management over time. Baseline: Dependent Goal status:GOAL MET   PLAN:  OT FREQUENCY: 1 /week  OT DURATION: 12 weeks and PRN  PLANNED INTERVENTIONS: 97110-Therapeutic exercises, 97530- Therapeutic activity, 97535- Self Care, [id number]- Manual therapy, Patient/Family education, Manual lymph drainage, Compression bandaging, and fit with appropriate compression garments  PLAN FOR NEXT SESSION:  Cont MLD as established Pt  and family edu for LE self-care  Zebedee Dec, MS, OTR/L, CLT-LANA 07/13/23 4:04 PM

## 2023-07-14 ENCOUNTER — Other Ambulatory Visit: Payer: Self-pay

## 2023-07-17 ENCOUNTER — Other Ambulatory Visit (HOSPITAL_COMMUNITY): Payer: Self-pay

## 2023-07-18 ENCOUNTER — Encounter: Payer: Self-pay | Admitting: Occupational Therapy

## 2023-07-18 ENCOUNTER — Other Ambulatory Visit: Payer: Self-pay | Admitting: Pharmacy Technician

## 2023-07-18 ENCOUNTER — Other Ambulatory Visit: Payer: Self-pay

## 2023-07-18 ENCOUNTER — Ambulatory Visit: Admitting: Occupational Therapy

## 2023-07-18 ENCOUNTER — Other Ambulatory Visit (HOSPITAL_COMMUNITY): Payer: Self-pay

## 2023-07-18 DIAGNOSIS — M79641 Pain in right hand: Secondary | ICD-10-CM | POA: Diagnosis not present

## 2023-07-18 DIAGNOSIS — I890 Lymphedema, not elsewhere classified: Secondary | ICD-10-CM

## 2023-07-18 DIAGNOSIS — M25531 Pain in right wrist: Secondary | ICD-10-CM | POA: Diagnosis not present

## 2023-07-18 MED ORDER — HUMIRA (2 PEN) 40 MG/0.8ML ~~LOC~~ AJKT
AUTO-INJECTOR | SUBCUTANEOUS | 4 refills | Status: DC
  Filled 2023-07-18: qty 2, 28d supply, fill #0
  Filled 2023-08-11: qty 2, 28d supply, fill #1

## 2023-07-18 NOTE — Therapy (Unsigned)
 OUTPATIENT OCCUPATIONAL THERAPY TREATMENT NOTE AND PROGRESS REPORT  BLE/ BLQ LYMPHEDEMA  Patient Name: Karen [name] MRN: [id number] DOB:23-Jul-1970, 53 y.o., female Today's Date: 07/18/2023  REPORTING PERIOD:  END OF SESSION:   OT End of Session - 07/18/23 1431     Visit Number 50    Number of Visits 72    Date for OT Re-Evaluation 08/17/23    OT Start Time 0800    OT Stop Time 0900    OT Time Calculation (min) 60 min    Activity Tolerance Patient tolerated treatment well;No increased pain    Behavior During Therapy [facility] for tasks assessed/performed            Past Medical History:  Diagnosis Date   Adenomyosis    Anemia    Arthritis 2013   L knee gets steroid injections    Arthritis, rheumatic, acute or subacute    Asthmatic bronchitis 01/31/2017   Back pain    Chronic constipation    Diarrhea    DVT of lower extremity (deep venous thrombosis) (HCC)    Dysmenorrhea    Endometriosis    Esophageal stricture    G6PD deficiency    GERD (gastroesophageal reflux disease)    History of kidney stones    History of uterine fibroid    IBS (irritable bowel syndrome)    Interstitial cystitis    Lactose intolerance    Migraine    with aura   Other hemochromatosis 06/10/2021   PONV (postoperative nausea and vomiting)    Recurrent upper respiratory infection (URI)    Sebaceous cyst of breast, right lower inner quadrant 10/25/2012   Excised 11/21/12    Sleep apnea    Syncope, non cardiac    Past Surgical History:  Procedure Laterality Date   ARTHROSCOPIC HAGLUNDS REPAIR     BREAST CYST EXCISION Right 11/21/2012   Procedure: CYST EXCISION BREAST;  Surgeon: Sherlean JINNY Laughter, MD;  Location: [facility];  Service: General;  Laterality: Right;   BREAST CYST EXCISION Bilateral 01/18/2022   Procedure: EXCISION OF SEBACEOUS CYST BILATERAL BREAST;  Surgeon: Vanderbilt Ned, MD;  Location: [facility];  Service: General;  Laterality: Bilateral;    BREAST EXCISIONAL BIOPSY Right 11/2012   CESAREAN SECTION  04/19/1993   CHOLECYSTECTOMY     COLONOSCOPY  05/02/2011   Procedure: COLONOSCOPY;  Surgeon: Lynwood LITTIE Celestia Mickey., MD;  Location: [facility];  Service: Endoscopy;  Laterality: N/A;   colonoscopy  11/04/2014   DENTAL SURGERY  03/06/2018   2 surgeries ( 03/06/2018 and 04/06/2018)   to remove two separate benign tumors   ESOPHAGEAL MANOMETRY N/A 11/04/2015   Procedure: ESOPHAGEAL MANOMETRY (EM);  Surgeon: Lynwood Celestia, MD;  Location: [facility];  Service: Endoscopy;  Laterality: N/A;   ESOPHAGOGASTRODUODENOSCOPY  05/02/2011   Procedure: ESOPHAGOGASTRODUODENOSCOPY (EGD);  Surgeon: Lynwood LITTIE Celestia Mickey., MD;  Location: [facility];  Service: Endoscopy;  Laterality: N/A;   ESOPHAGOGASTRODUODENOSCOPY N/A 09/01/2014   Procedure: ESOPHAGOGASTRODUODENOSCOPY (EGD);  Surgeon: Lynwood Celestia, MD;  Location: [facility];  Service: Endoscopy;  Laterality: N/A;   IR IMAGING GUIDED PORT INSERTION  07/06/2023   KNEE SURGERY     LAPAROSCOPY     x 4   LESION REMOVAL N/A 01/18/2022   Procedure: EXCISION OF SEBACEOUS CYSTS CHEST AND NECK;  Surgeon: Vanderbilt Ned, MD;  Location: [facility];  Service: General;  Laterality: N/A;   PH IMPEDANCE STUDY N/A 11/04/2015   Procedure: PH IMPEDANCE STUDY;  Surgeon: Lynwood  Celestia, MD;  Location: [facility];  Service: Endoscopy;  Laterality: N/A;   VAGINAL HYSTERECTOMY  2010   TVH--ovaries remain   Patient Active Problem List   Diagnosis Date Noted   Sjogren syndrome with keratoconjunctivitis (HCC) 12/09/2022   Other hemochromatosis 06/10/2021   Elevated LFTs 04/04/2021   Colitis 04/04/2021   Bacterial overgrowth syndrome 05/21/2020   Obesity 05/21/2020   History of endometriosis 05/21/2020   Constipation due to outlet dysfunction 02/05/2020   Gastroesophageal reflux disease 02/05/2020   Obstructive sleep apnea treated with continuous positive airway pressure (CPAP) 06/16/2017   Perennial  allergic rhinitis with a nonallergic component 01/31/2017   Asthmatic bronchitis 01/31/2017   GI symptoms 01/31/2017   Family history of colon cancer 01/27/2015   Chest pain 12/13/2012   Dyspnea 12/13/2012   DVT of lower extremity (deep venous thrombosis) (HCC) 01/03/1990    PCP: Gaile New, MD  REFERRING PROVIDER: Valery Ripple, MD  REFERRING DIAG: I89.0  THERAPY DIAG:  Lymphedema, not elsewhere classified  Rationale for Evaluation and Treatment: Rehabilitation  ONSET DATE: ~2014; exacerbation in 2023  SUBJECTIVE:                                                                                                                                                                                          SUBJECTIVE STATEMENT:  Pt presents for OT to address lower quadrant, including deep abdominal lymphatics, and LLE lymphedema w/ associated pain. Pt got port for Humira  infusions at R chest late last week. She reports 3/10 pain at port site . It's sore. Pt reports her L calf was painful and swollen after port placement procedure. It felt like some one was sitting on my leg. Pt encouraged to report these symptoms to her provider . A venous ultrasound documented in 2023 was negative for L DVT.  PERTINENT HISTORY: CVI, OSA (no CPAP), GERD, Esophageal stricture, IBS, Lactose intolerant, Diarrhea, Hx LE DVT, recent RA dx  PAIN:  Are you having pain? Yes: 3/10 - new port site Pain location: LLE, digestive discomfort Pain description: myalgia, tight, heavy, tingling, numbness Aggravating factors: standing, walking, dependent sitting > 30 minutes Relieving factors: elevation  PRECAUTIONS: Other: LYMPHEDEMA; Hx LLE DVT  WEIGHT BEARING RESTRICTIONS: No  FALLS:  Has patient fallen in last 6 months? No  LIVING ENVIRONMENT: Lives with: lives with spouse and daughter Lives in: House/apartment Stairs: Yes; Internal: 14 steps; yes and External: 4 steps; yes Has following equipment  at home: None  OCCUPATION: [employer] Services operational officer, walking, desk work  LEISURE: family time  HAND DOMINANCE: right   PRIOR LEVEL OF FUNCTION: Independent  PATIENT GOALS: Feel better, be  able to be more active without pain, wear preferred street shoes  OBJECTIVE: Note: Objective measures were completed at Evaluation unless otherwise noted.  COGNITION:  Overall cognitive status: Within functional limits for tasks assessed   POSTURE: WFL  LE ROM: WFL  LE MMT: WFL  LYMPHEDEMA ASSESSMENTS: non-Ca related  INFECTIONS: denies cellulitis and wound hx  BLE COMPARATIVE LIMB VOLUMETRICS Initial 11/15/22  LANDMARK RIGHT (dominant)  R LEG (A-D) 3028.9 ml  R THIGH (E-G) 4809.60 ml  R FULL LIMB (A-G) 7838.5 ml  Limb Volume differential (LVD)  %  Volume change since initial %  Volume change overall V  (Blank rows = not tested)  LANDMARK LEFT  (Rx)  L LEG (A-D) 3189.9 ml  L THIGH (E-G) 4959.8 ml  L FULL LIMB (A-G) 8149.7 ml  Limb Volume differential (LVD)  LEG LVD = 5.32%, L>R; THIGH LVD = 3.12%, L>R; And full LLE LVD = 3.97%, L>R.   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  Skin  Description Hyper-Keratosis Peau d' Orange Shiny Tight Fibrotic/ Indurated Fatty Doughy Spongy/ boggy       R>L x  x   Skin dry Flaky WNL Macerated   mildly      Color Redness Varicosities Blanching Hemosiderin Stain Mottled        x   Odor Malodorous Yeast Fungal infection  WNL      x   Temperature Warm Cool wnl     x    Pitting Edema   1+ 2+ 3+ 4+ Non-pitting         x   Girth  Symmetrical Asymmetrical                   Distribution    L>R toes to groin    Stemmer Sign Positive Negative   +L -R   Lymphorrhea History Of:  Present Absent     x    Wounds History Of Present Absent Venous Arterial Pressure Sheer     x        Signs of Infection Redness Warmth Erythema Acute Swelling Drainage Borders                    Sensation Light Touch Deep pressure Hypersensitivity   In tact Impaired In tact Impaired Absent Impaired   x  x  x     Nails WNL   Fungus nail dystrophy   x     Hair Growth Symmetrical Asymmetrical   x    Skin Creases Base of toes  Ankles   Base of Fingers knees       Abdominal pannus Thigh Lobules  Face/neck   x           GAIT: Distance walked: >500' Assistive device utilized: None Level of assistance: Complete Independence Comments: Functional ambulation and transfers [facility]  LYMPHEDEMA LIFE IMPACT SCALE (LLIS): Initial 11/15/22  50%  FOTO functional outcome measure: Deferred. FOTO discontinued.  TODAY'S TREATMENT:  MLD to BLQ emphasis on colon and LLE Pt edu for le SELF CARE  PATIENT EDUCATION:  Continued Pt/ CG edu for how function of cisterna chyli, part of the thoracic duct of deep abdominal lymphatics, assists with digestion by filtering many of the fats and proteins from the digestive system. Sub optimal lymphatic flow in this region may result in constipation, bloating, swelling, etc. MLD helps mobilize these protein and fats , and the rhythmic movement of manual lymphatic drainage stimulates digestive muscles and increase peristalsis. Provided printed resource for reference.  Topics include outcome of comparative limb volumetrics- starting limb volume differentials (LVDs), technology and gradient techniques used for short stretch, multilayer compression wrapping, simple self-MLD,  therapeutic lymphatic pumping exercises, skin/nail care, LE precautions,. compression garment recommendations and specifications, wear and care schedule and compression garment donning / doffing w assistive devices. Discussed progress towards all OT goals since commencing CDT. All questions answered to the Pt's satisfaction. Good return. Person educated: Patient  Education method: Explanation, Demonstration, and Handouts Education comprehension: verbalized understanding, returned demonstration, verbal cues required, and needs further education  HOME EXERCISE PROGRAM: BLE lymphatic pumping there ex using- 1 sets of 10 reps, each exercise in order-  1-2 x daily, bilaterally Simple self MLD 1 x daily Daily skin care to increase hydration, skin mobility and decrease infection risk- can be done during MLD Compression wraps 23/7 during intensive phase of CDT Compression garments/ devices during self-management phase of CDT  ASSESSMENT:  CLINICAL IMPRESSION:  No atypical LLE swelling noted today. No signs of redness and no pain with palpation during MLD. Pt mentioned that she slept in compression stockings after recent port placement because her L leg felt heavy and swollen. OT reminded Pt NOT to sleep in compression stockings as they can cut off lymph and blood circulation during periods of absent muscle pumping. Continued MLD to deep abdominal lymphatics and the ascending, transverse and descending colon aimed at reducing gut discomfort and constipation. To date, MLD to abdominal lymphatics has helped to stimulate peristalsis and to stimulate the parasympathetic nervous system. Pt has also been able to suspend frequent intestinal cleanses, reports decreased constipation and more regular bowel movements with regular MLD. Performed short neck sequence as established, then performed alternate pressure and release on descending, transverse and ascending colon combined with diaphragmatic breathing in supine with  knees flexed. We completed J strokes starting at distal end of colon (descending), then transverse and finally ascending colon region. Continue teaching simple self MLD and encourage family visit to clinic to learn MLD techniques. Cont  1 x weekly as per POC.  11/15/22 Initial Evaluation: Karen [name] is a 53 y.o. female presenting with very mild, stage II, LLE lymphedema 2/2 venous insufficiency and obesity. L Leg swelling and associated pain swelling fluctuates. It has progressed over time, and now no longer resolves with elevation over night. RLE swelling limits balance during functional ambulation. It exacerbates infection and falls risk. LLE/RLQ lymphedema interferes with functional performance in all occupational domains, including basic and instrumental ADLs, productive activities, leisure pursuits and social participation. Pt will benefit from Occupational Therapy for Complete Decongestive Therapy (CDT) to restore function, to reduce physical and psychologic suffering associated with chronic, progressive lymphedema and associated pain, and to limit infection. CDT will include manual lymphatic drainage (MLD), skin care, therapeutic exercise and compression wraps and garment's devices. Without skilled OT for lymphedema care the condition will worsen and further functional decline is expected  Custom-made gradient compression garments and HOS devices are  medically necessary because they are uniquely sized and shaped to fit the exact dimensions of the affected extremities, and to provide appropriate medical grade, graduated compression essential for optimally managing chronic, progressive lymphedema. Multiple custom compression garments are needed to ensure proper hygiene to limit infection risk. Custom compression garments should be replaced q 3-6 months When worn consistently for optimal lipo-lymphedema self-management over time. HOS devices, medically necessary to limit fibrosis buildup in tissue,  should be replaced q 2 years and PRN when worn out.      OBJECTIVE IMPAIRMENTS: decreased activity tolerance, decreased knowledge of condition, decreased knowledge of use of DME, increased edema, impaired sensation, pain, and chronic, progressive leg swelling.   ACTIVITY LIMITATIONS: dependent sitting, extended standing and/ or walking, squatting, and lower body dressing, fitting preferred street shoes  PARTICIPATION LIMITATIONS: shopping, community activity, occupation, and yard work  PERSONAL FACTORS: 3+ comorbidities: Hx DVT,  OSA, CVI. Varicose veins are also affecting patient's functional outcome.   REHAB POTENTIAL: Good  EVALUATION COMPLEXITY: Moderate   GOALS: Goals reviewed with patient? Yes  SHORT TERM GOALS: Target date: 4th OT Rx visit  SHORT TERM GOALS: Target date: 4th OT Rx visit   Pt will demonstrate understanding of lymphedema precautions and prevention strategies with modified independence using a printed reference to identify at least 5 precautions and discussing how s/he may implement them into daily life to reduce risk of progression with extra time. Baseline:Max A Goal status: GOAL MET   Pt will be able to apply multilayer, knee length, gradient, compression wraps to one leg at a time with modified assistance (extra time and assistive device/s) to decrease limb volume, to limit infection risk, and to limit lymphedema progression.  Baseline: Dependent Goal status: GOAL DISCHARGED. Pt Going to compression garments instead  LONG TERM GOALS: Target date: 02/08/23  Given this patient's Intake score of 67%/100% on the functional outcomes FOTO tool, patient will experience an increase in function of 5 points to improve basic and instrumental ADLs performance, including lymphedema self-care.  Baseline: Max A Goal status:GOAL DISCHARGED.  FOTO TOOL DISCONTINUED AT CLINIC   Given this patient's Intake score of 50% on the Lymphedema Life Impact Scale (LLIS), patient will  experience a reduction of at least 5 points in her perceived level of functional impairment resulting from lymphedema to improve functional performance and quality of life (QOL). Baseline: 50% Goal status: PROGRESSING  Pt will achieve at least a 10% volume reduction in B legs to return limb to typical size and shape, to limit infection risk and LE progression, to decrease pain, to improve function. Baseline: Dependent Goal status: PROGRESSING  4.  Pt will obtain proper compression garments/devices and achieve modified independence (extra time + assistive devices) with donning/doffing to optimize limb volume reductions and limit LE  progression over time. Baseline:  Goal status:GOAL MET  5.  During Intensive phase CDT , with modified independence, Pt will achieve at least 85% compliance with all lymphedema self-care home program components, including daily skin care, compression wraps and /or garments, simple self MLD and lymphatic pumping therex to habituate LE self care protocol  into ADLs for optimal LE self-management over time. Baseline: Dependent Goal status:GOAL MET   PLAN:  OT FREQUENCY: 1 /week  OT DURATION: 12 weeks and PRN  PLANNED INTERVENTIONS: 97110-Therapeutic exercises, 97530- Therapeutic activity, 97535- Self Care, [id number]- Manual therapy, Patient/Family education, Manual lymph drainage, Compression bandaging, and fit with appropriate compression garments  PLAN FOR NEXT SESSION:  Cont MLD as established Pt  and family edu for LE self-care  Zebedee Dec, MS, OTR/L, CLT-LANA 07/18/23 3:03 PM

## 2023-07-18 NOTE — Progress Notes (Signed)
 Specialty Pharmacy Initiation Note   Karen [name] is a 53 y.o. female who will be followed by the specialty pharmacy service for RxSp Rheumatoid Arthritis    Review of administration, indication, effectiveness, safety, potential side effects, storage/disposable, and missed dose instructions occurred today for patient's specialty medication(s) Adalimumab  (Humira  (2 Pen))     Patient/Caregiver did not have any additional questions or concerns.   Patient's therapy is appropriate to: Initiate    Goals Addressed             This Visit's Progress    Minimize recurrence of flares       Patient is initiating therapy. Patient will maintain adherence         [facility] Specialty Pharmacist

## 2023-07-18 NOTE — Progress Notes (Signed)
 Pharmacy Patient Advocate Encounter  Insurance verification completed.   The patient is insured through [facility]   Ran test claim for Humira . Co-pay is $0.  This test claim was processed through [facility]- copay amounts may vary at other pharmacies due to pharmacy/plan contracts, or as the patient moves through the different stages of their insurance plan.

## 2023-07-18 NOTE — Progress Notes (Signed)
 Specialty Pharmacy Initial Fill Coordination Note  Karen [name] is a 53 y.o. female contacted today regarding initial fill of specialty medication(s) Adalimumab  (Humira  (2 Pen))   Patient requested Pickup at [facility] at [place] date: 07/19/23   Medication will be filled on 7/15.   Patient is aware of $0 copayment.

## 2023-07-20 ENCOUNTER — Ambulatory Visit: Admitting: Occupational Therapy

## 2023-07-20 ENCOUNTER — Other Ambulatory Visit: Payer: Self-pay

## 2023-07-20 DIAGNOSIS — I890 Lymphedema, not elsewhere classified: Secondary | ICD-10-CM

## 2023-07-20 NOTE — Therapy (Signed)
 OUTPATIENT OCCUPATIONAL THERAPY TREATMENT NOTE  BLE/ BLQ LYMPHEDEMA  Patient Name: Karen [name] MRN: [id number] DOB:February 05, 1970, 53 y.o., female Today's Date: 07/20/2023  REPORTING PERIOD:  END OF SESSION:   OT End of Session - 07/20/23 0808     Visit Number 51    Number of Visits 72    Date for OT Re-Evaluation 08/17/23    OT Start Time 0806    OT Stop Time 0906    OT Time Calculation (min) 60 min    Activity Tolerance Patient tolerated treatment well;No increased pain    Behavior During Therapy [facility] for tasks assessed/performed            Past Medical History:  Diagnosis Date   Adenomyosis    Anemia    Arthritis 2013   L knee gets steroid injections    Arthritis, rheumatic, acute or subacute    Asthmatic bronchitis 01/31/2017   Back pain    Chronic constipation    Diarrhea    DVT of lower extremity (deep venous thrombosis) (HCC)    Dysmenorrhea    Endometriosis    Esophageal stricture    G6PD deficiency    GERD (gastroesophageal reflux disease)    History of kidney stones    History of uterine fibroid    IBS (irritable bowel syndrome)    Interstitial cystitis    Lactose intolerance    Migraine    with aura   Other hemochromatosis 06/10/2021   PONV (postoperative nausea and vomiting)    Recurrent upper respiratory infection (URI)    Sebaceous cyst of breast, right lower inner quadrant 10/25/2012   Excised 11/21/12    Sleep apnea    Syncope, non cardiac    Past Surgical History:  Procedure Laterality Date   ARTHROSCOPIC HAGLUNDS REPAIR     BREAST CYST EXCISION Right 11/21/2012   Procedure: CYST EXCISION BREAST;  Surgeon: Sherlean JINNY Laughter, MD;  Location: [facility];  Service: General;  Laterality: Right;   BREAST CYST EXCISION Bilateral 01/18/2022   Procedure: EXCISION OF SEBACEOUS CYST BILATERAL BREAST;  Surgeon: Vanderbilt Ned, MD;  Location: [facility];  Service: General;  Laterality: Bilateral;   BREAST EXCISIONAL  BIOPSY Right 11/2012   CESAREAN SECTION  04/19/1993   CHOLECYSTECTOMY     COLONOSCOPY  05/02/2011   Procedure: COLONOSCOPY;  Surgeon: Lynwood LITTIE Celestia Mickey., MD;  Location: [facility];  Service: Endoscopy;  Laterality: N/A;   colonoscopy  11/04/2014   DENTAL SURGERY  03/06/2018   2 surgeries ( 03/06/2018 and 04/06/2018)   to remove two separate benign tumors   ESOPHAGEAL MANOMETRY N/A 11/04/2015   Procedure: ESOPHAGEAL MANOMETRY (EM);  Surgeon: Lynwood Celestia, MD;  Location: [facility];  Service: Endoscopy;  Laterality: N/A;   ESOPHAGOGASTRODUODENOSCOPY  05/02/2011   Procedure: ESOPHAGOGASTRODUODENOSCOPY (EGD);  Surgeon: Lynwood LITTIE Celestia Mickey., MD;  Location: [facility];  Service: Endoscopy;  Laterality: N/A;   ESOPHAGOGASTRODUODENOSCOPY N/A 09/01/2014   Procedure: ESOPHAGOGASTRODUODENOSCOPY (EGD);  Surgeon: Lynwood Celestia, MD;  Location: [facility];  Service: Endoscopy;  Laterality: N/A;   IR IMAGING GUIDED PORT INSERTION  07/06/2023   KNEE SURGERY     LAPAROSCOPY     x 4   LESION REMOVAL N/A 01/18/2022   Procedure: EXCISION OF SEBACEOUS CYSTS CHEST AND NECK;  Surgeon: Vanderbilt Ned, MD;  Location: [facility];  Service: General;  Laterality: N/A;   PH IMPEDANCE STUDY N/A 11/04/2015   Procedure: PH IMPEDANCE STUDY;  Surgeon: Lynwood Celestia, MD;  Location: [facility];  Service: Endoscopy;  Laterality: N/A;   VAGINAL HYSTERECTOMY  2010   TVH--ovaries remain   Patient Active Problem List   Diagnosis Date Noted   Sjogren syndrome with keratoconjunctivitis (HCC) 12/09/2022   Other hemochromatosis 06/10/2021   Elevated LFTs 04/04/2021   Colitis 04/04/2021   Bacterial overgrowth syndrome 05/21/2020   Obesity 05/21/2020   History of endometriosis 05/21/2020   Constipation due to outlet dysfunction 02/05/2020   Gastroesophageal reflux disease 02/05/2020   Obstructive sleep apnea treated with continuous positive airway pressure (CPAP) 06/16/2017   Perennial allergic rhinitis  with a nonallergic component 01/31/2017   Asthmatic bronchitis 01/31/2017   GI symptoms 01/31/2017   Family history of colon cancer 01/27/2015   Chest pain 12/13/2012   Dyspnea 12/13/2012   DVT of lower extremity (deep venous thrombosis) (HCC) 01/03/1990    PCP: Gaile New, MD  REFERRING PROVIDER: Valery Ripple, MD  REFERRING DIAG: I89.0  THERAPY DIAG:  Lymphedema, not elsewhere classified  Rationale for Evaluation and Treatment: Rehabilitation  ONSET DATE: ~2014; exacerbation in 2023  SUBJECTIVE:                                                                                                                                                                                          SUBJECTIVE STATEMENT:  Pt presents for OT to address lower quadrant, including deep abdominal lymphatics, and LLE lymphedema w/ associated pain. Pt reporting new pain in L lateral neck readiating to her back.  Pt rates neck pain 5/10 after Tylenol . Pt reports she has been constipated for last 3 days after 3 days of diarrhea. Provided hot pack to L lateral neck and shoulder throughout session.  PERTINENT HISTORY: CVI, OSA (no CPAP), GERD, Esophageal stricture, IBS, Lactose intolerant, Diarrhea, Hx LE DVT, recent RA dx  PAIN:  Are you having pain? Yes: 5/10 Pain location: LLE, digestive discomfort Pain description: myalgia, tight, heavy, tingling, numbness Aggravating factors: standing, walking, dependent sitting > 30 minutes Relieving factors: elevation  PRECAUTIONS: Other: LYMPHEDEMA; Hx LLE DVT  WEIGHT BEARING RESTRICTIONS: No  FALLS:  Has patient fallen in last 6 months? No  LIVING ENVIRONMENT: Lives with: lives with spouse and daughter Lives in: House/apartment Stairs: Yes; Internal: 14 steps; yes and External: 4 steps; yes Has following equipment at home: None  OCCUPATION: Dipl[employer] Services operational officer, walking, desk work  LEISURE: family time  HAND DOMINANCE: right   PRIOR LEVEL OF  FUNCTION: Independent  PATIENT GOALS: Feel better, be able to be more active without pain, wear preferred street shoes  OBJECTIVE: Note: Objective measures were completed at Evaluation unless otherwise noted.  COGNITION:  Overall  cognitive status: Within functional limits for tasks assessed   POSTURE: WFL  LE ROM: WFL  LE MMT: WFL  LYMPHEDEMA ASSESSMENTS: non-Ca related  INFECTIONS: denies cellulitis and wound hx  BLE COMPARATIVE LIMB VOLUMETRICS Initial 11/15/22  LANDMARK RIGHT (dominant)  R LEG (A-D) 3028.9 ml  R THIGH (E-G) 4809.60 ml  R FULL LIMB (A-G) 7838.5 ml  Limb Volume differential (LVD)  %  Volume change since initial %  Volume change overall V  (Blank rows = not tested)  LANDMARK LEFT  (Rx)  L LEG (A-D) 3189.9 ml  L THIGH (E-G) 4959.8 ml  L FULL LIMB (A-G) 8149.7 ml  Limb Volume differential (LVD)  LEG LVD = 5.32%, L>R; THIGH LVD = 3.12%, L>R; And full LLE LVD = 3.97%, L>R.   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  Skin  Description Hyper-Keratosis Peau d' Orange Shiny Tight Fibrotic/ Indurated Fatty Doughy Spongy/ boggy       R>L x  x   Skin dry Flaky WNL Macerated   mildly      Color Redness Varicosities Blanching Hemosiderin Stain Mottled        x   Odor Malodorous Yeast Fungal infection  WNL      x   Temperature Warm Cool wnl     x    Pitting Edema   1+ 2+ 3+ 4+ Non-pitting         x   Girth Symmetrical Asymmetrical                   Distribution    L>R toes to groin    Stemmer Sign Positive Negative   +L -R   Lymphorrhea  History Of:  Present Absent     x    Wounds History Of Present Absent Venous Arterial Pressure Sheer     x        Signs of Infection Redness Warmth Erythema Acute Swelling Drainage Borders                    Sensation Light Touch Deep pressure Hypersensitivity   In tact Impaired In tact Impaired Absent Impaired   x  x  x     Nails WNL   Fungus nail dystrophy   x     Hair Growth Symmetrical Asymmetrical   x    Skin Creases Base of toes  Ankles   Base of Fingers knees       Abdominal pannus Thigh Lobules  Face/neck   x           GAIT: Distance walked: >500' Assistive device utilized: None Level of assistance: Complete Independence Comments: Functional ambulation and transfers [facility]  LYMPHEDEMA LIFE IMPACT SCALE (LLIS): Initial 11/15/22  50%  FOTO functional outcome measure: Deferred. FOTO discontinued.  TODAY'S TREATMENT:  MLD to BLQ emphasis on colon and LLE Pt edu for le SELF CARE  PATIENT EDUCATION:  Continued Pt/ CG edu for how function of cisterna chyli, part of the thoracic duct of deep abdominal lymphatics, assists with digestion by filtering many of the fats and proteins from the digestive system. Sub optimal lymphatic flow in this region may result in constipation, bloating, swelling, etc. MLD helps mobilize these protein and fats , and the rhythmic movement of manual lymphatic drainage stimulates digestive muscles and increase peristalsis. Provided printed resource for reference.  Topics include outcome of comparative limb volumetrics- starting limb volume differentials (LVDs), technology and gradient techniques used for short stretch, multilayer compression wrapping, simple self-MLD, therapeutic lymphatic pumping exercises, skin/nail care, LE precautions,. compression garment recommendations and specifications, wear and care  schedule and compression garment donning / doffing w assistive devices. Discussed progress towards all OT goals since commencing CDT. All questions answered to the Pt's satisfaction. Good return. Person educated: Patient  Education method: Explanation, Demonstration, and Handouts Education comprehension: verbalized understanding, returned demonstration, verbal cues required, and needs further education  HOME EXERCISE PROGRAM: BLE lymphatic pumping there ex using- 1 sets of 10 reps, each exercise in order-  1-2 x daily, bilaterally Simple self MLD 1 x daily Daily skin care to increase hydration, skin mobility and decrease infection risk- can be done during MLD Compression wraps 23/7 during intensive phase of CDT Compression garments/ devices during self-management phase of CDT  ASSESSMENT:  CLINICAL IMPRESSION:  Please review GOALs section for progress to date. Pt continues to benefit from MLD to reduce chronic inflammation and toxins from the intestinal area. Althoiugh more research on MLD's effectiveness in treating IBS is needed to determine its efficacy conclusively, preliminary findings suggest that MLD offers symptomatic relief for some patients. To date, MLD to abdominal lymphatics has helped to stimulate peristalsis and to stimulate the parasympathetic nervous system such that Pt has been able to suspend frequent intestinal cleanses, reports decreased frequency of constipation and more regular bowel movements with regular MLD.  No atypical LLE swelling noted today. No signs of redness and no pain with palpation during MLD. Continued MLD to deep abdominal lymphatics and the ascending, transverse and descending colon aimed at reducing gut discomfort and constipation.  Performed short neck sequence as established, then performed alternate pressure and release on descending, transverse and ascending colon combined with diaphragmatic breathing in supine with knees flexed. We completed J strokes  starting at distal end of colon (descending), then transverse and finally ascending colon region. Continue teaching simple self MLD and encourage family visit to clinic to learn MLD techniques. Cont  1 x weekly as per POC.  11/15/22 Initial Evaluation: Karen [name] is a 53 y.o. female presenting with very mild, stage II, LLE lymphedema 2/2 venous insufficiency and obesity. L Leg swelling and associated pain swelling fluctuates. It has progressed over time, and now no longer resolves with elevation over night. RLE swelling limits balance during functional ambulation. It exacerbates infection and falls risk. LLE/RLQ lymphedema interferes with functional performance in all occupational domains, including basic and instrumental ADLs, productive activities, leisure pursuits and social participation. Pt will benefit from Occupational Therapy for Complete Decongestive Therapy (CDT) to restore function, to reduce physical and psychologic suffering associated with chronic, progressive lymphedema and associated pain, and to limit infection. CDT will include manual lymphatic drainage (MLD), skin care, therapeutic exercise and compression wraps and garment's devices. Without skilled OT for lymphedema care the condition will worsen and further functional  decline is expected  Custom-made gradient compression garments and HOS devices are medically necessary because they are uniquely sized and shaped to fit the exact dimensions of the affected extremities, and to provide appropriate medical grade, graduated compression essential for optimally managing chronic, progressive lymphedema. Multiple custom compression garments are needed to ensure proper hygiene to limit infection risk. Custom compression garments should be replaced q 3-6 months When worn consistently for optimal lipo-lymphedema self-management over time. HOS devices, medically necessary to limit fibrosis buildup in tissue, should be replaced q 2 years and PRN when  worn out.      OBJECTIVE IMPAIRMENTS: decreased activity tolerance, decreased knowledge of condition, decreased knowledge of use of DME, increased edema, impaired sensation, pain, and chronic, progressive leg swelling.   ACTIVITY LIMITATIONS: dependent sitting, extended standing and/ or walking, squatting, and lower body dressing, fitting preferred street shoes  PARTICIPATION LIMITATIONS: shopping, community activity, occupation, and yard work  PERSONAL FACTORS: 3+ comorbidities: Hx DVT,  OSA, CVI. Varicose veins are also affecting patient's functional outcome.   REHAB POTENTIAL: Good  EVALUATION COMPLEXITY: Moderate   GOALS: Goals reviewed with patient? Yes  SHORT TERM GOALS: Target date: 4th OT Rx visit  SHORT TERM GOALS: Target date: 4th OT Rx visit   Pt will demonstrate understanding of lymphedema precautions and prevention strategies with modified independence using a printed reference to identify at least 5 precautions and discussing how s/he may implement them into daily life to reduce risk of progression with extra time. Baseline:Max A Goal status: GOAL MET   Pt will be able to apply multilayer, knee length, gradient, compression wraps to one leg at a time with modified assistance (extra time and assistive device/s) to decrease limb volume, to limit infection risk, and to limit lymphedema progression.  Baseline: Dependent Goal status: GOAL DISCHARGED. Pt Going to compression garments instead  LONG TERM GOALS: Target date: 02/08/23  Given this patient's Intake score of 67%/100% on the functional outcomes FOTO tool, patient will experience an increase in function of 5 points to improve basic and instrumental ADLs performance, including lymphedema self-care.  Baseline: Max A Goal status:GOAL DISCHARGED.  FOTO TOOL DISCONTINUED AT CLINIC   Given this patient's Intake score of 50% on the Lymphedema Life Impact Scale (LLIS), patient will experience a reduction of at least 5  points in her perceived level of functional impairment resulting from lymphedema to improve functional performance and quality of life (QOL). Baseline: 50% Goal status: PROGRESSING  Pt will achieve at least a 10% volume reduction in B legs to return limb to typical size and shape, to limit infection risk and LE progression, to decrease pain, to improve function. Baseline: Dependent Goal status: PROGRESSING  4.  Pt will obtain proper compression garments/devices and achieve modified independence (extra time + assistive devices) with donning/doffing to optimize limb volume reductions and limit LE  progression over time. Baseline:  Goal status:GOAL MET  5.  During Intensive phase CDT , with modified independence, Pt will achieve at least 85% compliance with all lymphedema self-care home program components, including daily skin care, compression wraps and /or garments, simple self MLD and lymphatic pumping therex to habituate LE self care protocol  into ADLs for optimal LE self-management over time. Baseline: Dependent Goal status:GOAL MET   PLAN:  OT FREQUENCY: 1 /week  OT DURATION: 12 weeks and PRN  PLANNED INTERVENTIONS: 97110-Therapeutic exercises, 97530- Therapeutic activity, 97535- Self Care, 02859- Manual therapy, Patient/Family education, Manual lymph drainage, Compression bandaging, and fit with appropriate compression  garments  PLAN FOR NEXT SESSION:  Cont MLD as established Pt and family edu for LE self-care  Zebedee Dec, MS, OTR/L, CLT-LANA 07/20/23 12:50 PM

## 2023-07-21 ENCOUNTER — Other Ambulatory Visit (HOSPITAL_COMMUNITY): Payer: Self-pay

## 2023-07-21 DIAGNOSIS — M542 Cervicalgia: Secondary | ICD-10-CM | POA: Diagnosis not present

## 2023-07-21 MED ORDER — METHOCARBAMOL 500 MG PO TABS
500.0000 mg | ORAL_TABLET | Freq: Three times a day (TID) | ORAL | 1 refills | Status: AC
  Filled 2023-07-21: qty 30, 10d supply, fill #0

## 2023-07-25 ENCOUNTER — Encounter: Payer: Self-pay | Admitting: Dermatology

## 2023-07-25 ENCOUNTER — Ambulatory Visit (INDEPENDENT_AMBULATORY_CARE_PROVIDER_SITE_OTHER): Payer: Commercial Managed Care - PPO | Admitting: Dermatology

## 2023-07-25 VITALS — BP 125/76

## 2023-07-25 DIAGNOSIS — L280 Lichen simplex chronicus: Secondary | ICD-10-CM

## 2023-07-25 DIAGNOSIS — L720 Epidermal cyst: Secondary | ICD-10-CM

## 2023-07-25 NOTE — Progress Notes (Signed)
   Follow-Up Visit   Subjective  Karen [name] is a 53 y.o. female established patient who presents for FOLLOW UP on the diagnoses listed below:  Patient was last evaluated on 03/01/23.   Lichen Simplex Chronicus: Prescribed Nystatin  -TMC to apply 2 weeks and alternate with Zinc  Oxide mixed with Aquaphor. Patient reports sxs are resolved.    Add on: Colonoscopy done 05/25/23 - negative, no issues found Dx with RA on 06/01/23. - now taking Humira  Port placement on 07/06/23 due to hemochromatosis    The following portions of the chart were reviewed this encounter and updated as appropriate: medications, allergies, medical history  Review of Systems:  No other skin or systemic complaints except as noted in HPI or Assessment and Plan.  Objective  Well appearing patient in no apparent distress; mood and affect are within normal limits.   A focused examination was performed of the following areas: perianal region   Relevant exam findings are noted in the Assessment and Plan.    Assessment & Plan   LICHEN SIMPLEX CHRONICUS (Perianal Irritation) - Assessment: Patient reports significant improvement following previous treatment. Current examination reveals resolution of irritation.  - Plan:    Continue current management with water wipes for routine hygiene    Use Desitin as needed for early signs of irritation    Apply nystatin -trimethylamine ointment if irritation recurs    Patient to follow up if symptoms return or worsen  Labial Cyst (Milia) - Assessment: Small lesion identified on left labia majora, consistent with milia rather than sebaceous cyst. Described as tiny, pearly-like cyst, firm and not easily expressed. Benign and typically does not progress to abscess.  - Plan:    Reassure patient about benign nature of the lesion    Recommend gentle cleansing with CeraVe containing benzoyl peroxide    Observe for any changes    Offer cosmetic removal if desired (patient  cost starting at $200)  Annual Skin Check - Assessment: Last full skin examination performed in February. No immediate dermatological concerns identified during today's focused exam. - Plan:    Schedule next annual skin check for February 2026   No follow-ups on file.   Documentation: I have reviewed the above documentation for accuracy and completeness, and I agree with the above.  I, Shirron Maranda, CMA, am acting as scribe for [employer], DO.   Delon Lenis, DO

## 2023-07-25 NOTE — Patient Instructions (Addendum)
 Date: Tue Jul 25 2023  Hello Karen [name],  Thank you for visiting today. Here is a summary of the key instructions:   - Medications:   - Continue Humira  injections for rheumatoid arthritis   - Use nystatin -trimethylamine ointment if bottom irritation returns  - Follow-up:   - Next skin check appointment in February 2026   - Return sooner if any new skin issues arise  - Optional Procedures:   - Consider cosmetic removal of milia if desired (starts at $200)  We look forward to seeing you at your next visit. If you have any questions or concerns before then, please do not hesitate to contact our office.  Warm regards,  Dr. Delon Lenis, Dermatology  Important Information  Due to recent changes in healthcare laws, you may see results of your pathology and/or laboratory studies on MyChart before the doctors have had a chance to review them. We understand that in some cases there may be results that are confusing or concerning to you. Please understand that not all results are received at the same time and often the doctors may need to interpret multiple results in order to provide you with the best plan of care or course of treatment. Therefore, we ask that you please give us  2 business days to thoroughly review all your results before contacting the office for clarification. Should we see a critical lab result, you will be contacted sooner.   If You Need Anything After Your Visit  If you have any questions or concerns for your doctor, please call our main line at [phone] If no one answers, please leave a voicemail as directed and we will return your call as soon as possible. Messages left after 4 pm will be answered the following business day.   You may also send us  a message via MyChart. We typically respond to MyChart messages within 1-2 business days.  For prescription refills, please ask your pharmacy to contact our office. Our fax number is [phone].  If you have an  urgent issue when the clinic is closed that cannot wait until the next business day, you can page your doctor at the number below.    Please note that while we do our best to be available for urgent issues outside of office hours, we are not available 24/7.   If you have an urgent issue and are unable to reach us , you may choose to seek medical care at your doctor's office, retail clinic, urgent care center, or emergency room.  If you have a medical emergency, please immediately call 911 or go to the emergency department. In the event of inclement weather, please call our main line at [phone] for an update on the status of any delays or closures.  Dermatology Medication Tips: Please keep the boxes that topical medications come in in order to help keep track of the instructions about where and how to use these. Pharmacies typically print the medication instructions only on the boxes and not directly on the medication tubes.   If your medication is too expensive, please contact our office at [phone] or send us  a message through MyChart.   We are unable to tell what your co-pay for medications will be in advance as this is different depending on your insurance coverage. However, we may be able to find a substitute medication at lower cost or fill out paperwork to get insurance to cover a needed medication.   If a prior authorization is required to get your medication  covered by your insurance company, please allow us  1-2 business days to complete this process.  Drug prices often vary depending on where the prescription is filled and some pharmacies may offer cheaper prices.  The website www.goodrx.com contains coupons for medications through different pharmacies. The prices here do not account for what the cost may be with help from insurance (it may be cheaper with your insurance), but the website can give you the price if you did not use any insurance.  - You can print the associated  coupon and take it with your prescription to the pharmacy.  - You may also stop by our office during regular business hours and pick up a GoodRx coupon card.  - If you need your prescription sent electronically to a different pharmacy, notify our office through [facility] or by phone at [phone]

## 2023-07-26 ENCOUNTER — Encounter: Payer: Self-pay | Admitting: Occupational Therapy

## 2023-07-26 ENCOUNTER — Other Ambulatory Visit: Payer: Self-pay

## 2023-07-26 ENCOUNTER — Other Ambulatory Visit (HOSPITAL_COMMUNITY): Payer: Self-pay

## 2023-07-26 ENCOUNTER — Ambulatory Visit: Admitting: Occupational Therapy

## 2023-07-26 DIAGNOSIS — I890 Lymphedema, not elsewhere classified: Secondary | ICD-10-CM

## 2023-07-26 MED ORDER — HUMIRA (2 PEN) 40 MG/0.8ML ~~LOC~~ AJKT: AUTO-INJECTOR | SUBCUTANEOUS | 3 refills | Status: DC

## 2023-07-26 NOTE — Therapy (Signed)
 OUTPATIENT OCCUPATIONAL THERAPY TREATMENT NOTE  BLE/ BLQ LYMPHEDEMA  Patient Name: Karen [name] MRN: [id number] DOB:Nov 27, 1970, 53 y.o., female Today's Date: 07/26/2023  REPORTING PERIOD:  END OF SESSION:   OT End of Session - 07/26/23 1311     Visit Number 52    Number of Visits 72    Date for OT Re-Evaluation 08/17/23    OT Start Time 0111    OT Stop Time 0205    OT Time Calculation (min) 54 min    Activity Tolerance Patient tolerated treatment well;No increased pain    Behavior During Therapy [facility] for tasks assessed/performed            Past Medical History:  Diagnosis Date   Adenomyosis    Anemia    Arthritis 2013   L knee gets steroid injections    Arthritis, rheumatic, acute or subacute    Asthmatic bronchitis 01/31/2017   Back pain    Chronic constipation    Diarrhea    DVT of lower extremity (deep venous thrombosis) (HCC)    Dysmenorrhea    Endometriosis    Esophageal stricture    G6PD deficiency    GERD (gastroesophageal reflux disease)    History of kidney stones    History of uterine fibroid    IBS (irritable bowel syndrome)    Interstitial cystitis    Lactose intolerance    Migraine    with aura   Other hemochromatosis 06/10/2021   PONV (postoperative nausea and vomiting)    Recurrent upper respiratory infection (URI)    Sebaceous cyst of breast, right lower inner quadrant 10/25/2012   Excised 11/21/12    Sleep apnea    Syncope, non cardiac    Past Surgical History:  Procedure Laterality Date   ARTHROSCOPIC HAGLUNDS REPAIR     BREAST CYST EXCISION Right 11/21/2012   Procedure: CYST EXCISION BREAST;  Surgeon: Sherlean JINNY Laughter, MD;  Location: [facility];  Service: General;  Laterality: Right;   BREAST CYST EXCISION Bilateral 01/18/2022   Procedure: EXCISION OF SEBACEOUS CYST BILATERAL BREAST;  Surgeon: Vanderbilt Ned, MD;  Location: [facility];  Service: General;  Laterality: Bilateral;   BREAST EXCISIONAL  BIOPSY Right 11/2012   CESAREAN SECTION  04/19/1993   CHOLECYSTECTOMY     COLONOSCOPY  05/02/2011   Procedure: COLONOSCOPY;  Surgeon: Lynwood LITTIE Celestia Mickey., MD;  Location: [facility];  Service: Endoscopy;  Laterality: N/A;   colonoscopy  11/04/2014   DENTAL SURGERY  03/06/2018   2 surgeries ( 03/06/2018 and 04/06/2018)   to remove two separate benign tumors   ESOPHAGEAL MANOMETRY N/A 11/04/2015   Procedure: ESOPHAGEAL MANOMETRY (EM);  Surgeon: Lynwood Celestia, MD;  Location: [facility];  Service: Endoscopy;  Laterality: N/A;   ESOPHAGOGASTRODUODENOSCOPY  05/02/2011   Procedure: ESOPHAGOGASTRODUODENOSCOPY (EGD);  Surgeon: Lynwood LITTIE Celestia Mickey., MD;  Location: [facility];  Service: Endoscopy;  Laterality: N/A;   ESOPHAGOGASTRODUODENOSCOPY N/A 09/01/2014   Procedure: ESOPHAGOGASTRODUODENOSCOPY (EGD);  Surgeon: Lynwood Celestia, MD;  Location: [facility];  Service: Endoscopy;  Laterality: N/A;   IR IMAGING GUIDED PORT INSERTION  07/06/2023   KNEE SURGERY     LAPAROSCOPY     x 4   LESION REMOVAL N/A 01/18/2022   Procedure: EXCISION OF SEBACEOUS CYSTS CHEST AND NECK;  Surgeon: Vanderbilt Ned, MD;  Location: [facility];  Service: General;  Laterality: N/A;   PH IMPEDANCE STUDY N/A 11/04/2015   Procedure: PH IMPEDANCE STUDY;  Surgeon: Lynwood Celestia, MD;  Location: [facility];  Service: Endoscopy;  Laterality: N/A;   VAGINAL HYSTERECTOMY  2010   TVH--ovaries remain   Patient Active Problem List   Diagnosis Date Noted   Sjogren syndrome with keratoconjunctivitis (HCC) 12/09/2022   Other hemochromatosis 06/10/2021   Elevated LFTs 04/04/2021   Colitis 04/04/2021   Bacterial overgrowth syndrome 05/21/2020   Obesity 05/21/2020   History of endometriosis 05/21/2020   Constipation due to outlet dysfunction 02/05/2020   Gastroesophageal reflux disease 02/05/2020   Obstructive sleep apnea treated with continuous positive airway pressure (CPAP) 06/16/2017   Perennial allergic rhinitis  with a nonallergic component 01/31/2017   Asthmatic bronchitis 01/31/2017   GI symptoms 01/31/2017   Family history of colon cancer 01/27/2015   Chest pain 12/13/2012   Dyspnea 12/13/2012   DVT of lower extremity (deep venous thrombosis) (HCC) 01/03/1990    PCP: Gaile New, MD  REFERRING PROVIDER: Valery Ripple, MD  REFERRING DIAG: I89.0  THERAPY DIAG:  Lymphedema, not elsewhere classified  Rationale for Evaluation and Treatment: Rehabilitation  ONSET DATE: ~2014; exacerbation in 2023  SUBJECTIVE:                                                                                                                                                                                          SUBJECTIVE STATEMENT:  Pt presents for OT to address lower quadrant, including deep abdominal lymphatics, and LLE lymphedema w/ associated pain. Pt reports she completed initial infusion of Humera last Friday with some side effects. She has another dose scheduled.   PERTINENT HISTORY: CVI, OSA (no CPAP), GERD, Esophageal stricture, IBS, Lactose intolerant, Diarrhea, Hx LE DVT, recent RA dx  PAIN:  Are you having pain? Yes: 5/10 Pain location: LLE, digestive discomfort Pain description: myalgia, tight, heavy, tingling, numbness Aggravating factors: standing, walking, dependent sitting > 30 minutes Relieving factors: elevation  PRECAUTIONS: Other: LYMPHEDEMA; Hx LLE DVT  WEIGHT BEARING RESTRICTIONS: No  FALLS:  Has patient fallen in last 6 months? No  LIVING ENVIRONMENT: Lives with: lives with spouse and daughter Lives in: House/apartment Stairs: Yes; Internal: 14 steps; yes and External: 4 steps; yes Has following equipment at home: None  OCCUPATION: [employer] operational officer, walking, desk work  LEISURE: family time  HAND DOMINANCE: right   PRIOR LEVEL OF FUNCTION: Independent  PATIENT GOALS: Feel better, be able to be more active without pain, wear preferred street  shoes  OBJECTIVE: Note: Objective measures were completed at Evaluation unless otherwise noted.  COGNITION:  Overall cognitive status: Within functional limits for tasks assessed   POSTURE: WFL  LE ROM: WFL  LE MMT: WFL  LYMPHEDEMA ASSESSMENTS: non-Ca related  INFECTIONS: denies cellulitis and wound hx  BLE COMPARATIVE LIMB VOLUMETRICS Initial 11/15/22  LANDMARK RIGHT (dominant)  R LEG (A-D) 3028.9 ml  R THIGH (E-G) 4809.60 ml  R FULL LIMB (A-G) 7838.5 ml  Limb Volume differential (LVD)  %  Volume change since initial %  Volume change overall V  (Blank rows = not tested)  LANDMARK LEFT  (Rx)  L LEG (A-D) 3189.9 ml  L THIGH (E-G) 4959.8 ml  L FULL LIMB (A-G) 8149.7 ml  Limb Volume differential (LVD)  LEG LVD = 5.32%, L>R; THIGH LVD = 3.12%, L>R; And full LLE LVD = 3.97%, L>R.   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  Skin  Description Hyper-Keratosis Peau d' Orange Shiny Tight Fibrotic/ Indurated Fatty Doughy Spongy/ boggy       R>L x  x   Skin dry Flaky WNL Macerated   mildly      Color Redness Varicosities Blanching Hemosiderin Stain Mottled        x   Odor Malodorous Yeast Fungal infection  WNL      x   Temperature Warm Cool wnl     x    Pitting Edema   1+ 2+ 3+ 4+ Non-pitting         x   Girth Symmetrical Asymmetrical                   Distribution    L>R toes to groin    Stemmer Sign Positive Negative   +L -R   Lymphorrhea History Of:  Present Absent     x    Wounds History Of Present Absent Venous Arterial Pressure Sheer     x         Signs of Infection Redness Warmth Erythema Acute Swelling Drainage Borders                    Sensation Light Touch Deep pressure Hypersensitivity   In tact Impaired In tact Impaired Absent Impaired   x  x  x     Nails WNL   Fungus nail dystrophy   x     Hair Growth Symmetrical Asymmetrical   x    Skin Creases Base of toes  Ankles   Base of Fingers knees       Abdominal pannus Thigh Lobules  Face/neck   x           GAIT: Distance walked: >500' Assistive device utilized: None Level of assistance: Complete Independence Comments: Functional ambulation and transfers [facility]  LYMPHEDEMA LIFE IMPACT SCALE (LLIS): Initial 11/15/22  50%  FOTO functional outcome measure: Deferred. FOTO discontinued.  TODAY'S TREATMENT:  MLD to BLQ emphasis on colon and LLE Pt edu for le SELF CARE  PATIENT EDUCATION:  Continued Pt/ CG edu for how function of cisterna chyli, part of the thoracic duct of deep abdominal lymphatics, assists with digestion by filtering many of the fats and proteins from the digestive system. Sub optimal lymphatic flow in this region may result in constipation, bloating, swelling, etc. MLD helps mobilize these protein and fats , and the rhythmic movement of manual lymphatic drainage stimulates digestive muscles and increase peristalsis. Provided printed resource for reference.  Topics include outcome of comparative limb volumetrics- starting limb volume differentials (LVDs), technology and gradient techniques used for short stretch, multilayer compression wrapping, simple self-MLD, therapeutic lymphatic pumping exercises, skin/nail care, LE precautions,. compression garment recommendations and specifications, wear and care schedule and compression garment donning / doffing w assistive devices. Discussed progress towards all OT goals since  commencing CDT. All questions answered to the Pt's satisfaction. Good return. Person educated: Patient  Education method: Explanation, Demonstration, and Handouts Education comprehension: verbalized understanding, returned demonstration, verbal cues required, and needs further education  HOME EXERCISE PROGRAM: BLE lymphatic pumping there ex using- 1 sets of 10 reps, each exercise in order-  1-2 x daily, bilaterally Simple self MLD 1 x daily Daily skin care to increase hydration, skin mobility and decrease infection risk- can be done during MLD Compression wraps 23/7 during intensive phase of CDT Compression garments/ devices during self-management phase of CDT  ASSESSMENT:  CLINICAL IMPRESSION:No atypical LLE swelling noted today. No signs of redness and no pain with palpation during MLD. Continued MLD to deep abdominal lymphatics and the ascending, transverse and descending colon aimed at reducing gut discomfort and constipation.  Performed short neck sequence as established, then performed alternate pressure and release on descending, transverse and ascending colon combined with diaphragmatic breathing in supine with knees flexed. We completed J strokes starting at distal end of colon (descending), then transverse and finally ascending colon region. Continue teaching simple self MLD and encourage family visit to clinic to learn MLD techniques.    Pt continues to benefit from MLD to reduce chronic inflammation and toxins from the intestinal area. Although more research on MLD's effectiveness in treating IBS is needed to determine its efficacy conclusively, preliminary findings suggest that MLD offers symptomatic relief for some patients. To date, MLD to abdominal lymphatics has helped to stimulate peristalsis and to stimulate the parasympathetic nervous system such that Pt has been able to suspend frequent intestinal cleanses, reports decreased frequency of constipation and more regular bowel  movements with regular MLD.Cont  1 x weekly as per POC.  11/15/22 Initial Evaluation: Tonnette Zwiebel is a 53 y.o. female presenting with very mild, stage II, LLE lymphedema 2/2 venous insufficiency and obesity. L Leg swelling and associated pain swelling fluctuates. It has progressed over time, and now no longer resolves with elevation over night. RLE swelling limits balance during functional ambulation. It exacerbates infection and falls risk. LLE/RLQ lymphedema interferes with functional performance in all occupational domains, including basic and instrumental ADLs, productive activities, leisure pursuits and social participation. Pt will benefit from Occupational Therapy for Complete Decongestive Therapy (CDT) to restore function, to reduce physical and psychologic suffering associated with chronic, progressive lymphedema and associated pain, and to limit infection. CDT will include manual lymphatic drainage (MLD), skin care, therapeutic exercise and compression wraps and garment's devices. Without skilled OT for lymphedema care the condition will worsen and further functional decline is expected  Custom-made gradient compression garments and  HOS devices are medically necessary because they are uniquely sized and shaped to fit the exact dimensions of the affected extremities, and to provide appropriate medical grade, graduated compression essential for optimally managing chronic, progressive lymphedema. Multiple custom compression garments are needed to ensure proper hygiene to limit infection risk. Custom compression garments should be replaced q 3-6 months When worn consistently for optimal lipo-lymphedema self-management over time. HOS devices, medically necessary to limit fibrosis buildup in tissue, should be replaced q 2 years and PRN when worn out.      OBJECTIVE IMPAIRMENTS: decreased activity tolerance, decreased knowledge of condition, decreased knowledge of use of DME, increased edema, impaired  sensation, pain, and chronic, progressive leg swelling.   ACTIVITY LIMITATIONS: dependent sitting, extended standing and/ or walking, squatting, and lower body dressing, fitting preferred street shoes  PARTICIPATION LIMITATIONS: shopping, community activity, occupation, and yard work  PERSONAL FACTORS: 3+ comorbidities: Hx DVT,  OSA, CVI. Varicose veins are also affecting patient's functional outcome.   REHAB POTENTIAL: Good  EVALUATION COMPLEXITY: Moderate   GOALS: Goals reviewed with patient? Yes  SHORT TERM GOALS: Target date: 4th OT Rx visit  SHORT TERM GOALS: Target date: 4th OT Rx visit   Pt will demonstrate understanding of lymphedema precautions and prevention strategies with modified independence using a printed reference to identify at least 5 precautions and discussing how s/he may implement them into daily life to reduce risk of progression with extra time. Baseline:Max A Goal status: GOAL MET   Pt will be able to apply multilayer, knee length, gradient, compression wraps to one leg at a time with modified assistance (extra time and assistive device/s) to decrease limb volume, to limit infection risk, and to limit lymphedema progression.  Baseline: Dependent Goal status: GOAL DISCHARGED. Pt Going to compression garments instead  LONG TERM GOALS: Target date: 02/08/23  Given this patient's Intake score of 67%/100% on the functional outcomes FOTO tool, patient will experience an increase in function of 5 points to improve basic and instrumental ADLs performance, including lymphedema self-care.  Baseline: Max A Goal status:GOAL DISCHARGED.  FOTO TOOL DISCONTINUED AT CLINIC   Given this patient's Intake score of 50% on the Lymphedema Life Impact Scale (LLIS), patient will experience a reduction of at least 5 points in her perceived level of functional impairment resulting from lymphedema to improve functional performance and quality of life (QOL). Baseline: 50% Goal status:  PROGRESSING  Pt will achieve at least a 10% volume reduction in B legs to return limb to typical size and shape, to limit infection risk and LE progression, to decrease pain, to improve function. Baseline: Dependent Goal status: PROGRESSING  4.  Pt will obtain proper compression garments/devices and achieve modified independence (extra time + assistive devices) with donning/doffing to optimize limb volume reductions and limit LE  progression over time. Baseline:  Goal status:GOAL MET  5.  During Intensive phase CDT , with modified independence, Pt will achieve at least 85% compliance with all lymphedema self-care home program components, including daily skin care, compression wraps and /or garments, simple self MLD and lymphatic pumping therex to habituate LE self care protocol  into ADLs for optimal LE self-management over time. Baseline: Dependent Goal status:GOAL MET   PLAN:  OT FREQUENCY: 1 /week  OT DURATION: 12 weeks and PRN  PLANNED INTERVENTIONS: 97110-Therapeutic exercises, 97530- Therapeutic activity, 97535- Self Care, [id number]- Manual therapy, Patient/Family education, Manual lymph drainage, Compression bandaging, and fit with appropriate compression garments  PLAN FOR NEXT SESSION:  Cont MLD  as established Pt and family edu for LE self-care  Zebedee Dec, MS, OTR/L, CLT-LANA 07/26/23 3:21 PM

## 2023-07-27 DIAGNOSIS — M25531 Pain in right wrist: Secondary | ICD-10-CM | POA: Diagnosis not present

## 2023-07-28 ENCOUNTER — Encounter: Payer: Self-pay | Admitting: Occupational Therapy

## 2023-07-28 ENCOUNTER — Ambulatory Visit: Admitting: Occupational Therapy

## 2023-07-28 DIAGNOSIS — I890 Lymphedema, not elsewhere classified: Secondary | ICD-10-CM

## 2023-07-28 NOTE — Therapy (Signed)
 OUTPATIENT OCCUPATIONAL THERAPY TREATMENT NOTE  BLE/ BLQ LYMPHEDEMA  Patient Name: Karen [name] MRN: [id number] DOB:[birth year], 53 y.o., female Today's Date: 07/28/2023  REPORTING PERIOD:  END OF SESSION:   OT End of Session - 07/28/23 1115     Visit Number 53    Number of Visits 72    Date for OT Re-Evaluation 08/17/23    OT Start Time 0906    OT Stop Time 1005    OT Time Calculation (min) 59 min    Activity Tolerance Patient tolerated treatment well;No increased pain    Behavior During Therapy [facility] for tasks assessed/performed            Past Medical History:  Diagnosis Date   Adenomyosis    Anemia    Arthritis 2013   L knee gets steroid injections    Arthritis, rheumatic, acute or subacute    Asthmatic bronchitis 01/31/2017   Back pain    Chronic constipation    Diarrhea    DVT of lower extremity (deep venous thrombosis) (HCC)    Dysmenorrhea    Endometriosis    Esophageal stricture    G6PD deficiency    GERD (gastroesophageal reflux disease)    History of kidney stones    History of uterine fibroid    IBS (irritable bowel syndrome)    Interstitial cystitis    Lactose intolerance    Migraine    with aura   Other hemochromatosis 06/10/2021   PONV (postoperative nausea and vomiting)    Recurrent upper respiratory infection (URI)    Sebaceous cyst of breast, right lower inner quadrant 10/25/2012   Excised 11/21/12    Sleep apnea    Syncope, non cardiac    Past Surgical History:  Procedure Laterality Date   ARTHROSCOPIC HAGLUNDS REPAIR     BREAST CYST EXCISION Right 11/21/2012   Procedure: CYST EXCISION BREAST;  Surgeon: Sherlean JINNY Laughter, MD;  Location: [facility];  Service: General;  Laterality: Right;   BREAST CYST EXCISION Bilateral 01/18/2022   Procedure: EXCISION OF SEBACEOUS CYST BILATERAL BREAST;  Surgeon: Vanderbilt Ned, MD;  Location: [facility];  Service: General;  Laterality: Bilateral;   BREAST EXCISIONAL  BIOPSY Right 11/2012   CESAREAN SECTION  04/19/1993   CHOLECYSTECTOMY     COLONOSCOPY  05/02/2011   Procedure: COLONOSCOPY;  Surgeon: Lynwood LITTIE Celestia Mickey., MD;  Location: [facility];  Service: Endoscopy;  Laterality: N/A;   colonoscopy  11/04/2014   DENTAL SURGERY  03/06/2018   2 surgeries ( 03/06/2018 and 04/06/2018)   to remove two separate benign tumors   ESOPHAGEAL MANOMETRY N/A 11/04/2015   Procedure: ESOPHAGEAL MANOMETRY (EM);  Surgeon: Lynwood Celestia, MD;  Location: [facility];  Service: Endoscopy;  Laterality: N/A;   ESOPHAGOGASTRODUODENOSCOPY  05/02/2011   Procedure: ESOPHAGOGASTRODUODENOSCOPY (EGD);  Surgeon: Lynwood LITTIE Celestia Mickey., MD;  Location: [facility];  Service: Endoscopy;  Laterality: N/A;   ESOPHAGOGASTRODUODENOSCOPY N/A 09/01/2014   Procedure: ESOPHAGOGASTRODUODENOSCOPY (EGD);  Surgeon: Lynwood Celestia, MD;  Location: [facility];  Service: Endoscopy;  Laterality: N/A;   IR IMAGING GUIDED PORT INSERTION  07/06/2023   KNEE SURGERY     LAPAROSCOPY     x 4   LESION REMOVAL N/A 01/18/2022   Procedure: EXCISION OF SEBACEOUS CYSTS CHEST AND NECK;  Surgeon: Vanderbilt Ned, MD;  Location:  S[facility];  Service: General;  Laterality: N/A;   PH IMPEDANCE STUDY N/A 11/04/2015   Procedure: PH IMPEDANCE STUDY;  Surgeon: Lynwood Celestia, MD;  Location: [facility];  Service: Endoscopy;  Laterality: N/A;   VAGINAL HYSTERECTOMY  2010   TVH--ovaries remain   Patient Active Problem List   Diagnosis Date Noted   Sjogren syndrome with keratoconjunctivitis (HCC) 12/09/2022   Other hemochromatosis 06/10/2021   Elevated LFTs 04/04/2021   Colitis 04/04/2021   Bacterial overgrowth syndrome 05/21/2020   Obesity 05/21/2020   History of endometriosis 05/21/2020   Constipation due to outlet dysfunction 02/05/2020   Gastroesophageal reflux disease 02/05/2020   Obstructive sleep apnea treated with continuous positive airway pressure (CPAP) 06/16/2017   Perennial allergic rhinitis  with a nonallergic component 01/31/2017   Asthmatic bronchitis 01/31/2017   GI symptoms 01/31/2017   Family history of colon cancer 01/27/2015   Chest pain 12/13/2012   Dyspnea 12/13/2012   DVT of lower extremity (deep venous thrombosis) (HCC) 01/03/1990    PCP: Gaile New, MD  REFERRING PROVIDER: Valery Ripple, MD  REFERRING DIAG: I89.0  THERAPY DIAG:  Lymphedema, not elsewhere classified  Rationale for Evaluation and Treatment: Rehabilitation  ONSET DATE: ~2014; exacerbation in 2023  SUBJECTIVE:                                                                                                                                                                                          SUBJECTIVE STATEMENT:  Pt presents for OT to address lower quadrant, including deep abdominal lymphatics, and LLE lymphedema w/ associated pain. Pt is accompanied by her daughter, Thersia, who is here to learn about lymphatic drainage manual techniques. Pt reports 3/10 in her legs (aching) and an upset stomach with sensation of fullness.   PERTINENT HISTORY: CVI, OSA (no CPAP), GERD, Esophageal stricture, IBS, Lactose intolerant, Diarrhea, Hx LE DVT, recent RA dx  PAIN:  Are you having pain? Yes: 5/10 Pain location: LLE, digestive discomfort Pain description: myalgia, tight, heavy, tingling, numbness Aggravating factors: standing, walking, dependent sitting > 30 minutes Relieving factors: elevation  PRECAUTIONS: Other: LYMPHEDEMA; Hx LLE DVT  WEIGHT BEARING RESTRICTIONS: No  FALLS:  Has patient fallen in last 6 months? No  LIVING ENVIRONMENT: Lives with: lives with spouse and daughter Lives in: House/apartment Stairs: Yes; Internal: 14 steps; yes and External: 4 steps; yes Has following equipment at home: None  OCCUPATION: [employer] operational officer, walking, desk work  LEISURE: family time  HAND DOMINANCE: right   PRIOR LEVEL OF FUNCTION: Independent  PATIENT GOALS: Feel better, be  able to be more active without pain, wear preferred street shoes  OBJECTIVE: Note: Objective measures were completed at Evaluation unless otherwise noted.  COGNITION:  Overall cognitive status: Within functional limits for tasks assessed   POSTURE: [facility]  LE ROM: WFL  LE MMT: WFL  LYMPHEDEMA ASSESSMENTS: non-Ca related  INFECTIONS: denies cellulitis and wound hx  BLE COMPARATIVE LIMB VOLUMETRICS Initial 11/15/22  LANDMARK RIGHT (dominant)  R LEG (A-D) 3028.9 ml  R THIGH (E-G) 4809.60 ml  R FULL LIMB (A-G) 7838.5 ml  Limb Volume differential (LVD)  %  Volume change since initial %  Volume change overall V  (Blank rows = not tested)  LANDMARK LEFT  (Rx)  L LEG (A-D) 3189.9 ml  L THIGH (E-G) 4959.8 ml  L FULL LIMB (A-G) 8149.7 ml  Limb Volume differential (LVD)  LEG LVD = 5.32%, L>R; THIGH LVD = 3.12%, L>R; And full LLE LVD = 3.97%, L>R.   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  Skin  Description Hyper-Keratosis Peau d' Orange Shiny Tight Fibrotic/ Indurated Fatty Doughy Spongy/ boggy       R>L x  x   Skin dry Flaky WNL Macerated   mildly      Color Redness Varicosities Blanching Hemosiderin Stain Mottled        x   Odor Malodorous Yeast Fungal infection  WNL      x   Temperature Warm Cool wnl     x    Pitting Edema   1+ 2+ 3+ 4+ Non-pitting         x   Girth Symmetrical Asymmetrical                   Distribution    L>R toes to groin    Stemmer Sign Positive Negative   +L -R   Lymphorrhea History Of:  Present Absent     x    Wounds History Of  Present Absent Venous Arterial Pressure Sheer     x        Signs of Infection Redness Warmth Erythema Acute Swelling Drainage Borders                    Sensation Light Touch Deep pressure Hypersensitivity   In tact Impaired In tact Impaired Absent Impaired   x  x  x     Nails WNL   Fungus nail dystrophy   x     Hair Growth Symmetrical Asymmetrical   x    Skin Creases Base of toes  Ankles   Base of Fingers knees       Abdominal pannus Thigh Lobules  Face/neck   x           GAIT: Distance walked: >500' Assistive device utilized: None Level of assistance: Complete Independence Comments: Functional ambulation and transfers [facility]  LYMPHEDEMA LIFE IMPACT SCALE (LLIS): Initial 11/15/22  50%  FOTO functional outcome measure: Deferred. FOTO discontinued.  TODAY'S TREATMENT:  MLD to BLQ emphasis on colon and LLE Pt and family edu for le SELF CARE- simple self MLD  PATIENT EDUCATION:  Continued Pt/ CG edu for how function of cisterna chyli, part of the thoracic duct of deep abdominal lymphatics, assists with digestion by filtering many of the fats and proteins from the digestive system. Sub optimal lymphatic flow in this region may result in constipation, bloating, swelling, etc. MLD helps mobilize these protein and fats , and the rhythmic movement of manual lymphatic drainage stimulates digestive muscles and increase peristalsis. Provided printed resource for reference.  Topics include outcome of comparative limb volumetrics- starting limb volume differentials (LVDs), technology and gradient techniques used for short stretch, multilayer compression wrapping, simple self-MLD, therapeutic lymphatic pumping exercises, skin/nail care, LE precautions,. compression garment recommendations and specifications, wear and care schedule and compression garment  donning / doffing w assistive devices. Discussed progress towards all OT goals since commencing CDT. All questions answered to the Pt's satisfaction. Good return. Person educated: Patient  Education method: Explanation, Demonstration, and Handouts Education comprehension: verbalized understanding, returned demonstration, verbal cues required, and needs further education  HOME EXERCISE PROGRAM: BLE lymphatic pumping there ex using- 1 sets of 10 reps, each exercise in order-  1-2 x daily, bilaterally Simple self MLD 1 x daily Daily skin care to increase hydration, skin mobility and decrease infection risk- can be done during MLD Compression wraps 23/7 during intensive phase of CDT Compression garments/ devices during self-management phase of CDT  ASSESSMENT:  CLINICAL IMPRESSION: Emphasis of visit was on Pt and family training for simple self MLD to abdominal lymphatics. By end of session Pt's daughter is able to perform alternate pressure and release on descending, transverse and ascending colon combined with diaphragmatic breathing in supine with mod assist.  Next session we'll teach short neck sequence and J J strokes starting at distal end of colon (descending), then transverse and finally ascending colon region.    Pt continues to benefit from MLD to reduce chronic inflammation and toxins from the intestinal area. Although more research on MLD's effectiveness in treating IBS is needed to determine its efficacy conclusively, preliminary findings suggest that MLD offers symptomatic relief for some patients. To date, MLD to abdominal lymphatics has helped to stimulate peristalsis and to stimulate the parasympathetic nervous system such that Pt has been able to suspend frequent intestinal cleanses, reports decreased frequency of constipation and more regular bowel movements with regular MLD.Cont  1 x weekly as per POC.  11/15/22 Initial Evaluation: Demri Poulton is a 53 y.o. female presenting with  very mild, stage II, LLE lymphedema 2/2 venous insufficiency and obesity. L Leg swelling and associated pain swelling fluctuates. It has progressed over time, and now no longer resolves with elevation over night. RLE swelling limits balance during functional ambulation. It exacerbates infection and falls risk. LLE/RLQ lymphedema interferes with functional performance in all occupational domains, including basic and instrumental ADLs, productive activities, leisure pursuits and social participation. Pt will benefit from Occupational Therapy for Complete Decongestive Therapy (CDT) to restore function, to reduce physical and psychologic suffering associated with chronic, progressive lymphedema and associated pain, and to limit infection. CDT will include manual lymphatic drainage (MLD), skin care, therapeutic exercise and compression wraps and garment's devices. Without skilled OT for lymphedema care the condition will worsen and further functional decline is expected  Custom-made gradient compression garments and HOS devices are medically necessary because they are uniquely sized and shaped to fit the exact dimensions of the affected extremities,  and to provide appropriate medical grade, graduated compression essential for optimally managing chronic, progressive lymphedema. Multiple custom compression garments are needed to ensure proper hygiene to limit infection risk. Custom compression garments should be replaced q 3-6 months When worn consistently for optimal lipo-lymphedema self-management over time. HOS devices, medically necessary to limit fibrosis buildup in tissue, should be replaced q 2 years and PRN when worn out.      OBJECTIVE IMPAIRMENTS: decreased activity tolerance, decreased knowledge of condition, decreased knowledge of use of DME, increased edema, impaired sensation, pain, and chronic, progressive leg swelling.   ACTIVITY LIMITATIONS: dependent sitting, extended standing and/ or walking,  squatting, and lower body dressing, fitting preferred street shoes  PARTICIPATION LIMITATIONS: shopping, community activity, occupation, and yard work  PERSONAL FACTORS: 3+ comorbidities: Hx DVT,  OSA, CVI. Varicose veins are also affecting patient's functional outcome.   REHAB POTENTIAL: Good  EVALUATION COMPLEXITY: Moderate   GOALS: Goals reviewed with patient? Yes  SHORT TERM GOALS: Target date: 4th OT Rx visit  SHORT TERM GOALS: Target date: 4th OT Rx visit   Pt will demonstrate understanding of lymphedema precautions and prevention strategies with modified independence using a printed reference to identify at least 5 precautions and discussing how s/he may implement them into daily life to reduce risk of progression with extra time. Baseline:Max A Goal status: GOAL MET   Pt will be able to apply multilayer, knee length, gradient, compression wraps to one leg at a time with modified assistance (extra time and assistive device/s) to decrease limb volume, to limit infection risk, and to limit lymphedema progression.  Baseline: Dependent Goal status: GOAL DISCHARGED. Pt Going to compression garments instead  LONG TERM GOALS: Target date: 02/08/23  Given this patient's Intake score of 67%/100% on the functional outcomes FOTO tool, patient will experience an increase in function of 5 points to improve basic and instrumental ADLs performance, including lymphedema self-care.  Baseline: Max A Goal status:GOAL DISCHARGED.  FOTO TOOL DISCONTINUED AT CLINIC   Given this patient's Intake score of 50% on the Lymphedema Life Impact Scale (LLIS), patient will experience a reduction of at least 5 points in her perceived level of functional impairment resulting from lymphedema to improve functional performance and quality of life (QOL). Baseline: 50% Goal status: PROGRESSING  Pt will achieve at least a 10% volume reduction in B legs to return limb to typical size and shape, to limit infection  risk and LE progression, to decrease pain, to improve function. Baseline: Dependent Goal status: PROGRESSING  4.  Pt will obtain proper compression garments/devices and achieve modified independence (extra time + assistive devices) with donning/doffing to optimize limb volume reductions and limit LE  progression over time. Baseline:  Goal status:GOAL MET  5.  During Intensive phase CDT , with modified independence, Pt will achieve at least 85% compliance with all lymphedema self-care home program components, including daily skin care, compression wraps and /or garments, simple self MLD and lymphatic pumping therex to habituate LE self care protocol  into ADLs for optimal LE self-management over time. Baseline: Dependent Goal status:GOAL MET   PLAN:  OT FREQUENCY: 1 /week  OT DURATION: 12 weeks and PRN  PLANNED INTERVENTIONS: 97110-Therapeutic exercises, 97530- Therapeutic activity, 97535- Self Care, [id number]- Manual therapy, Patient/Family education, Manual lymph drainage, Compression bandaging, and fit with appropriate compression garments  PLAN FOR NEXT SESSION:  Cont MLD as established Pt and family edu for LE self-care  Zebedee Dec, MS, OTR/L, CLT-LANA 07/28/23 11:16 AM

## 2023-07-31 ENCOUNTER — Other Ambulatory Visit (HOSPITAL_COMMUNITY): Payer: Self-pay

## 2023-08-01 ENCOUNTER — Ambulatory Visit: Admitting: Occupational Therapy

## 2023-08-01 ENCOUNTER — Ambulatory Visit: Attending: Obstetrics & Gynecology

## 2023-08-01 DIAGNOSIS — M6281 Muscle weakness (generalized): Secondary | ICD-10-CM | POA: Insufficient documentation

## 2023-08-01 DIAGNOSIS — M542 Cervicalgia: Secondary | ICD-10-CM | POA: Insufficient documentation

## 2023-08-01 DIAGNOSIS — R279 Unspecified lack of coordination: Secondary | ICD-10-CM | POA: Insufficient documentation

## 2023-08-01 DIAGNOSIS — M62838 Other muscle spasm: Secondary | ICD-10-CM | POA: Diagnosis not present

## 2023-08-01 DIAGNOSIS — R102 Pelvic and perineal pain: Secondary | ICD-10-CM | POA: Insufficient documentation

## 2023-08-01 DIAGNOSIS — M5459 Other low back pain: Secondary | ICD-10-CM | POA: Diagnosis not present

## 2023-08-01 DIAGNOSIS — S46812A Strain of other muscles, fascia and tendons at shoulder and upper arm level, left arm, initial encounter: Secondary | ICD-10-CM | POA: Diagnosis not present

## 2023-08-01 NOTE — Therapy (Signed)
 OUTPATIENT PHYSICAL THERAPY FEMALE PELVIC TREATMENT   Patient Name: Karen [name] MRN: [id number] DOB:[birth year], 53 y.o., female Today's Date: 08/01/2023  END OF SESSION:  PT End of Session - 08/01/23 0854     Visit Number 44    Date for PT Re-Evaluation 10/10/23    Authorization Type [name]    PT Start Time [phone]    PT Stop Time 0931    PT Time Calculation (min) 38 min    Activity Tolerance Patient tolerated treatment well    Behavior During Therapy [facility] for tasks assessed/performed                     Past Medical History:  Diagnosis Date   Adenomyosis    Anemia    Arthritis 2013   L knee gets steroid injections    Arthritis, rheumatic, acute or subacute    Asthmatic bronchitis 01/31/2017   Back pain    Chronic constipation    Diarrhea    DVT of lower extremity (deep venous thrombosis) (HCC)    Dysmenorrhea    Endometriosis    Esophageal stricture    G6PD deficiency    GERD (gastroesophageal reflux disease)    History of kidney stones    History of uterine fibroid    IBS (irritable bowel syndrome)    Interstitial cystitis    Lactose intolerance    Migraine    with aura   Other hemochromatosis 06/10/2021   PONV (postoperative nausea and vomiting)    Recurrent upper respiratory infection (URI)    Sebaceous cyst of breast, right lower inner quadrant 10/25/2012   Excised 11/21/12    Sleep apnea    Syncope, non cardiac    Past Surgical History:  Procedure Laterality Date   ARTHROSCOPIC HAGLUNDS REPAIR     BREAST CYST EXCISION Right 11/21/2012   Procedure: CYST EXCISION BREAST;  Surgeon: Sherlean JINNY Laughter, MD;  Location: [facility];  Service: General;  Laterality: Right;   BREAST CYST EXCISION Bilateral 01/18/2022   Procedure: EXCISION OF SEBACEOUS CYST BILATERAL BREAST;  Surgeon: Vanderbilt Ned, MD;  Location: [facility];  Service: General;  Laterality: Bilateral;   BREAST EXCISIONAL BIOPSY Right  11/2012   CESAREAN SECTION  04/19/1993   CHOLECYSTECTOMY     COLONOSCOPY  05/02/2011   Procedure: COLONOSCOPY;  Surgeon: Lynwood LITTIE Celestia Mickey., MD;  Location: [facility];  Service: Endoscopy;  Laterality: N/A;   colonoscopy  11/04/2014   DENTAL SURGERY  03/06/2018   2 surgeries ( 03/06/2018 and 04/06/2018)   to remove two separate benign tumors   ESOPHAGEAL MANOMETRY N/A 11/04/2015   Procedure: ESOPHAGEAL MANOMETRY (EM);  Surgeon: Lynwood Celestia, MD;  Location: [facility];  Service: Endoscopy;  Laterality: N/A;   ESOPHAGOGASTRODUODENOSCOPY  05/02/2011   Procedure: ESOPHAGOGASTRODUODENOSCOPY (EGD);  Surgeon: Lynwood LITTIE Celestia Mickey., MD;  Location: [facility];  Service: Endoscopy;  Laterality: N/A;   ESOPHAGOGASTRODUODENOSCOPY N/A 09/01/2014   Procedure: ESOPHAGOGASTRODUODENOSCOPY (EGD);  Surgeon: Lynwood Celestia, MD;  Location: [facility];  Service: Endoscopy;  Laterality: N/A;   IR IMAGING GUIDED PORT INSERTION  07/06/2023   KNEE SURGERY     LAPAROSCOPY     x 4   LESION REMOVAL N/A 01/18/2022   Procedure: EXCISION OF SEBACEOUS CYSTS CHEST AND NECK;  Surgeon: Vanderbilt Ned, MD;  Location: [facility];  Service: General;  Laterality: N/A;   PH IMPEDANCE STUDY N/A 11/04/2015   Procedure: PH IMPEDANCE STUDY;  Surgeon: Lynwood Celestia,  MD;  Location: [facility];  Service: Endoscopy;  Laterality: N/A;   VAGINAL HYSTERECTOMY  2010   TVH--ovaries remain   Patient Active Problem List   Diagnosis Date Noted   Sjogren syndrome with keratoconjunctivitis (HCC) 12/09/2022   Other hemochromatosis 06/10/2021   Elevated LFTs 04/04/2021   Colitis 04/04/2021   Bacterial overgrowth syndrome 05/21/2020   Obesity 05/21/2020   History of endometriosis 05/21/2020   Constipation due to outlet dysfunction 02/05/2020   Gastroesophageal reflux disease 02/05/2020   Obstructive sleep apnea treated with continuous positive airway pressure (CPAP) 06/16/2017   Perennial allergic rhinitis with a  nonallergic component 01/31/2017   Asthmatic bronchitis 01/31/2017   GI symptoms 01/31/2017   Family history of colon cancer 01/27/2015   Chest pain 12/13/2012   Dyspnea 12/13/2012   DVT of lower extremity (deep venous thrombosis) (HCC) 01/03/1990    PCP: Elliot Charm, MD  REFERRING PROVIDER: Cleotilde Ronal RAMAN, MD   REFERRING DIAG: M62.89 (ICD-10-CM) - Pelvic floor dysfunction  THERAPY DIAG:  Pelvic pain  Other low back pain  Muscle weakness (generalized)  Other muscle spasm  Unspecified lack of coordination  Rationale for Evaluation and Treatment: Rehabilitation  ONSET DATE: chronic  SUBJECTIVE:                                                                                                                                                                                           SUBJECTIVE STATEMENT:  Pt has started Humira  and has a bad reaction, but is waiting to take one more dose to see if it works in order to treat the RA. She has a port now due to difficulty with phlebotomy and giving blood to manage hemochromatosis.   She states that with everything going on she has not been able to walk more than 1x/week. She has started having some Lt hip pain.   PAIN: 08/01/23 Are you having pain? Yes NPRS scale: 3/10 Pain location: Lower abdominal pain, Lt sided pain, low back pain  Pain type: turning, knotted, pressure Pain description: intermittent and constant   Aggravating factors: constipation or frequent bowel movements/constantly having bowel movements  Relieving factors: exercises (bowel massage, happy baby, spinal twists, walking)  PRECAUTIONS: None  WEIGHT BEARING RESTRICTIONS: No  FALLS:  Has patient fallen in last 6 months? No  LIVING ENVIRONMENT: Lives with: lives with their spouse Lives in: House/apartment  OCCUPATION: nurse, in admin  PLOF: Independent  PATIENT GOALS: decrease pain and have more normal bowel movements  PERTINENT HISTORY:   Vaginal hysterectomy, DVT LE, endometriosis, interstitial cystitis, exploratory laps for endometriosis, c-section, cholecystectomy, RA diagnosed 06/2023 Sexual abuse: No  BOWEL  MOVEMENT: Pain with bowel movement: Yes Type of bowel movement:Type (Bristol Stool Scale) 1-7 (sometimes full range in the same bowel movement), Frequency sometimes many times a day, sometimes up to 4 days in between, and Strain Yes Fully empty rectum: No Leakage: No Pads: No Fiber supplement: No - has attempted metamucil and it made constipation worse  URINATION: Pain with urination: Yes Fully empty bladder: No Stream: varies  Urgency: Yes: all the time Frequency: multiple times an hours  Leakage: Urge to void, Walking to the bathroom, Coughing, Sneezing, Laughing, and Exercise Pads: Yes: daily  INTERCOURSE: Pain with intercourse: Initial Penetration and During Penetration Ability to have vaginal penetration:  Yes: with pain Climax: non painful WNL   PREGNANCY: Vaginal deliveries 1 Tearing Yes: 3rd degree tear C-section deliveries 1 Currently pregnant No  PROLAPSE: Periodically will feel vaginal bulging, heaviness in lower abdomen   OBJECTIVE:  05/04/23:               Internal Pelvic Floor: mild tenderness in bil levator ani, but no increase in tension; with internal rectal exam, she was demonstrating paradoxical contraction when trying to eccentrically lengthen with exhale and had very hard time correcting  Patient confirms identification and approves PT to assess internal pelvic floor and treatment Yes  PELVIC MMT:   MMT eval  Vaginal 2/5, 3 second hold, 3 repeat contractions  Internal Anal Sphincter 1/5  External Anal Sphincter 2/5  Puborectalis 1/5  Diastasis Recti   (Blank rows = not tested)        TONE: WNL   PROLAPSE: Grade 2 anterior vaginal wall laxity  04/25/23: Curl-up test: abdominal distortion Transversus abdominus: week, difficulty getting active contraction (not been  working on strengthening)    LUMBARAROM/PROM:  A/PROM A/PROM  Eval (% available) 11/09/22 (% available) 04/25/23 (% available)  Flexion 50 90 90  Extension 25, pain anteriorly  25, pain in low back 50  Right lateral flexion 50, pain anteriorly  75, pain on right 50, pain on Rt  Left lateral flexion 50, pain anteriorly  75, pain on right 75   (Blank rows = not tested)  11/09/22: No internal rectal or vaginal pelvic floor exam performed due to high level of skin irritation   Weak transversus abdominus contraction  Abdominal restriction and tenderness in bil lower quadrant  Unable to perform pelvic tilt with appropriate coordination  LUMBARAROM/PROM:  A/PROM A/PROM  Eval (% available) 11/09/22 (% available)  Flexion 50 90  Extension 25, pain anteriorly  25, pain in low back  Right lateral flexion 50, pain anteriorly  75, pain on right  Left lateral flexion 50, pain anteriorly  75, pain on right   (Blank rows = not tested) 07/21/22: standing prolapse assessment demonstrated grade 2 anterior vaginal wall laxity   06/08/22:               External Perineal Exam: WNL                              Internal Pelvic Floor: burning reported with palpation of superficial muscles; deep aching/pressure with palpation of Lt levator ani   Patient confirms identification and approves PT to assess internal pelvic floor and treatment Yes  PELVIC MMT:   MMT eval  Vaginal 3/5  Internal Anal Sphincter 1/5  External Anal Sphincter 2/5  Puborectalis 1/5  Diastasis Recti   (Blank rows = not tested)  TONE: High in Lt levator ani   PROLAPSE: Not able to tell this session due to dyssynergic pelvic floor contraction     06/01/22: COGNITION: Overall cognitive status: Within functional limits for tasks assessed     SENSATION: Light touch: Appears intact Proprioception: Appears intact  GAIT: Comments: decreased hip extension, forward flexed trunk  POSTURE: rounded shoulders, forward  head, decreased lumbar lordosis, increased thoracic kyphosis, posterior pelvic tilt, and flexed trunk    LUMBARAROM/PROM:  A/PROM A/PROM  Eval (% available)  Flexion 50  Extension 25, pain anteriorly   Right lateral flexion 50, pain anteriorly   Left lateral flexion 50, pain anteriorly    (Blank rows = not tested)   PALPATION:   General  Significant abdominal restriction and tenderness; decreased rib mobility with mobilization and breathing    TODAY'S TREATMENT:                                                                                                                              DATE:  08/01/23 Manual: ILU bowel mobilization  Abdominal scar tissue mobilization Ileocecal valve mobilization External rectum mobilization Exercises: Supine bridge with hip adduction 2 x 10 Supine hip abduction red band 2 x 10 Supine piriformis stretch 60 sec bil Supine resisted march red band 2 x 10 Butterfly stretch 60 sec Lower trunk rotation 2 x 10 Single knee to chest 5x bil   06/07/23 Manual: ILU bowel mobilization  Abdominal scar tissue mobilization Ileocecal valve mobilization External rectum mobilization Therapeutic activities: Education on normal motility and that based on post-cleanse she demonstrates normal based on time frame Education on consistency of bowel movements remaining at 4-5 on bristol stool scale, continuing to have 2-3 bowel movements a day, and sensation of complete emptying or she needs to talk with doctor about returning to laxative program/cleanse in order to allow for colon/rectum healing   05/22/23 Manual: ILU bowel mobilization  Abdominal scar tissue mobilization Negative pressure soft tissue mobilization to low back and abdomen Quadratus lumborum and psoas release Neuromuscular re-education: Standing leg swings 20x bil Postural correction in standing due to stnading with knees locked out, increased anterior pelvic tilt and posterior rib rib; she  performed pelvic tilts in standing to help soften and improve awareness of rib cage stacked over pelvis for better pelvic floor muscle function   PATIENT EDUCATION:  Education details: See above Person educated: Patient Education method: Programmer, multimedia, Demonstration, Tactile cues, Verbal cues, and Handouts Education comprehension: verbalized understanding  HOME EXERCISE PROGRAM: [id number]  ASSESSMENT:  CLINICAL IMPRESSION: Pt has not been seen in PT for a while and has not been able to walk much or perform exercises on regular basis at home. She has started developing Rt hip pain that is preventing her from walking as much as she was. She is also having more difficulty with bowel movements. Due to these factors, believe that exercise review was the most important activity today in order to improve pelvic  support and circulation today. She tolerated all exercises and stretches well. HEP updated and new handout was provided. Manual techniques also performed to abdomen with good tolerance in order to help improve motility. She will continue to benefit from skilled PT intervention in order to decrease pain, improve bowel movements, and decrease urinary urgency/incontinence.     OBJECTIVE IMPAIRMENTS: decreased activity tolerance, decreased coordination, decreased endurance, decreased mobility, decreased strength, increased fascial restrictions, increased muscle spasms, impaired tone, postural dysfunction, and pain.   ACTIVITY LIMITATIONS: lifting, bending, continence, and locomotion level  PARTICIPATION LIMITATIONS: interpersonal relationship, community activity, and occupation  PERSONAL FACTORS: 1 comorbidity: medical history are also affecting patient's functional outcome.   REHAB POTENTIAL: Good  CLINICAL DECISION MAKING: Stable/uncomplicated  EVALUATION COMPLEXITY: Low   GOALS: Goals reviewed with patient? Yes  SHORT TERM GOALS: Updated 08/01/23  Pt will be independent with HEP.    Baseline: Goal status: MET 07/12/22  2.  Pt will be independent with diaphragmatic breathing and down training activities in order to improve pelvic floor relaxation.  Baseline:  Goal status: MET 07/12/22  3.  Pt will be independent with the knack, urge suppression technique, and double voiding in order to improve bladder habits and decrease urinary incontinence.   Baseline:  Goal status: MET 09/22/22  4.  Pt will be independent with use of squatty potty, relaxed toileting mechanics, and improved bowel movement techniques in order to increase ease of bowel movements and complete evacuation.   Baseline:  Goal status: MET 11/09/22  5.  Pt will be able to correctly perform diaphragmatic breathing and appropriate pressure management in order to prevent worsening vaginal wall laxity and improve pelvic floor A/ROM.   Baseline:  Goal status: MET 01/18/23  LONG TERM GOALS: Updated 08/01/23  Pt will be independent with advanced HEP.   Baseline:  Goal status: IN PROGRESS 08/01/23  2.  Pt will demonstrate normal pelvic floor muscle tone and A/ROM, able to achieve 4/5 strength with contractions and 10 sec endurance, in order to provide appropriate lumbopelvic support in functional activities.   Baseline: not assessed today per patient request  Goal status: IN PROGRESS 08/01/23  3.  Pt will increase all impaired lumbar A/ROM by 25% without pain.  Baseline: Pt seen a little bit of loss in some and improvement in extension Goal status:  IN PROGRESS 08/01/23  4.  Pt will report pain no higher than 3/10 with any activity. Baseline: pain has gotten up to 5/10, but currently at a 3/10 Goal status:  IN PROGRESS 08/01/23  5.  Pt will report 0/10 pain with vaginal penetration in order to improve intimate relationship with partner.    Baseline: no recent intercourse attempts  Goal status:  IN PROGRESS 08/01/23  6.  Pt will be able to go 2-3 hours in between voids without urgency or incontinence in  order to improve QOL and perform all functional activities with less difficulty.   Baseline:  Goal status: MET 02/21/23  7.  Pt will report no leaks with laughing, coughing, sneezing in order to improve comfort with interpersonal relationships and community activities.   Baseline: Pt states that she feels 30-40% better Goal status:  IN PROGRESS 08/01/23  8.  Pt will have consistent bowel movements 4-5x/week without straining or pain.  Baseline:  Goal status:  MET 02/21/23  PLAN:  PT FREQUENCY: 1-2x/week  PT DURATION: 6 months  PLANNED INTERVENTIONS: Therapeutic exercises, Therapeutic activity, Neuromuscular re-education, Balance training, Gait training, Patient/Family education, Self Care, Joint mobilization,  Dry Needling, Biofeedback, and Manual therapy  PLAN FOR NEXT SESSION: strengthening program - reinforce importance and go over some exercises each session; internal pelvic floor muscle exam  Josette Mares, PT, DPT07/29/259:32 AM

## 2023-08-02 ENCOUNTER — Ambulatory Visit

## 2023-08-02 ENCOUNTER — Other Ambulatory Visit: Payer: Self-pay

## 2023-08-02 ENCOUNTER — Ambulatory Visit: Admitting: Occupational Therapy

## 2023-08-02 DIAGNOSIS — M5459 Other low back pain: Secondary | ICD-10-CM | POA: Diagnosis not present

## 2023-08-02 DIAGNOSIS — M542 Cervicalgia: Secondary | ICD-10-CM

## 2023-08-02 DIAGNOSIS — S46812A Strain of other muscles, fascia and tendons at shoulder and upper arm level, left arm, initial encounter: Secondary | ICD-10-CM

## 2023-08-02 DIAGNOSIS — M25531 Pain in right wrist: Secondary | ICD-10-CM | POA: Diagnosis not present

## 2023-08-02 DIAGNOSIS — M62838 Other muscle spasm: Secondary | ICD-10-CM | POA: Diagnosis not present

## 2023-08-02 DIAGNOSIS — R279 Unspecified lack of coordination: Secondary | ICD-10-CM | POA: Diagnosis not present

## 2023-08-02 DIAGNOSIS — R102 Pelvic and perineal pain: Secondary | ICD-10-CM | POA: Diagnosis not present

## 2023-08-02 DIAGNOSIS — M6281 Muscle weakness (generalized): Secondary | ICD-10-CM | POA: Diagnosis not present

## 2023-08-02 NOTE — Therapy (Unsigned)
 OUTPATIENT PHYSICAL THERAPY CERVICAL EVALUATION   Patient Name: Karen [name] MRN: [id number] DOB:11-21-1970, 53 y.o., female Today's Date: 08/02/2023  END OF SESSION:  PT End of Session - 08/02/23 1739     Visit Number 45    Authorization Type [employer]    PT Start Time 1700    PT Stop Time 1740    PT Time Calculation (min) 40 min    Activity Tolerance Patient tolerated treatment well    Behavior During Therapy [facility] for tasks assessed/performed          Past Medical History:  Diagnosis Date   Adenomyosis    Anemia    Arthritis 2013   L knee gets steroid injections    Arthritis, rheumatic, acute or subacute    Asthmatic bronchitis 01/31/2017   Back pain    Chronic constipation    Diarrhea    DVT of lower extremity (deep venous thrombosis) (HCC)    Dysmenorrhea    Endometriosis    Esophageal stricture    G6PD deficiency    GERD (gastroesophageal reflux disease)    History of kidney stones    History of uterine fibroid    IBS (irritable bowel syndrome)    Interstitial cystitis    Lactose intolerance    Migraine    with aura   Other hemochromatosis 06/10/2021   PONV (postoperative nausea and vomiting)    Recurrent upper respiratory infection (URI)    Sebaceous cyst of breast, right lower inner quadrant 10/25/2012   Excised 11/21/12    Sleep apnea    Syncope, non cardiac    Past Surgical History:  Procedure Laterality Date   ARTHROSCOPIC HAGLUNDS REPAIR     BREAST CYST EXCISION Right 11/21/2012   Procedure: CYST EXCISION BREAST;  Surgeon: Sherlean JINNY Laughter, MD;  Location: [facility];  Service: General;  Laterality: Right;   BREAST CYST EXCISION Bilateral 01/18/2022   Procedure: EXCISION OF SEBACEOUS CYST BILATERAL BREAST;  Surgeon: Vanderbilt Ned, MD;  Location: [facility];  Service: General;  Laterality: Bilateral;   BREAST EXCISIONAL BIOPSY Right 11/2012   CESAREAN SECTION  04/19/1993   CHOLECYSTECTOMY      COLONOSCOPY  05/02/2011   Procedure: COLONOSCOPY;  Surgeon: Lynwood LITTIE Celestia Mickey., MD;  Location: [facility];  Service: Endoscopy;  Laterality: N/A;   colonoscopy  11/04/2014   DENTAL SURGERY  03/06/2018   2 surgeries ( 03/06/2018 and 04/06/2018)   to remove two separate benign tumors   ESOPHAGEAL MANOMETRY N/A 11/04/2015   Procedure: ESOPHAGEAL MANOMETRY (EM);  Surgeon: Lynwood Celestia, MD;  Location: [facility];  Service: Endoscopy;  Laterality: N/A;   ESOPHAGOGASTRODUODENOSCOPY  05/02/2011   Procedure: ESOPHAGOGASTRODUODENOSCOPY (EGD);  Surgeon: Lynwood LITTIE Celestia Mickey., MD;  Location: [facility];  Service: Endoscopy;  Laterality: N/A;   ESOPHAGOGASTRODUODENOSCOPY N/A 09/01/2014   Procedure: ESOPHAGOGASTRODUODENOSCOPY (EGD);  Surgeon: Lynwood Celestia, MD;  Location: [facility];  Service: Endoscopy;  Laterality: N/A;   IR IMAGING GUIDED PORT INSERTION  07/06/2023   KNEE SURGERY     LAPAROSCOPY     x 4   LESION REMOVAL N/A 01/18/2022   Procedure: EXCISION OF SEBACEOUS CYSTS CHEST AND NECK;  Surgeon: Vanderbilt Ned, MD;  Location:  [facility];  Service: General;  Laterality: N/A;   PH IMPEDANCE STUDY N/A 11/04/2015   Procedure: PH IMPEDANCE STUDY;  Surgeon: Lynwood Celestia, MD;  Location: [facility];  Service: Endoscopy;  Laterality: N/A;   VAGINAL HYSTERECTOMY  2010   TVH--ovaries  remain   Patient Active Problem List   Diagnosis Date Noted   Sjogren syndrome with keratoconjunctivitis (HCC) 12/09/2022   Other hemochromatosis 06/10/2021   Elevated LFTs 04/04/2021   Colitis 04/04/2021   Bacterial overgrowth syndrome 05/21/2020   Obesity 05/21/2020   History of endometriosis 05/21/2020   Constipation due to outlet dysfunction 02/05/2020   Gastroesophageal reflux disease 02/05/2020   Obstructive sleep apnea treated with continuous positive airway pressure (CPAP) 06/16/2017   Perennial allergic rhinitis with a nonallergic component 01/31/2017   Asthmatic bronchitis 01/31/2017   GI  symptoms 01/31/2017   Family history of colon cancer 01/27/2015   Chest pain 12/13/2012   Dyspnea 12/13/2012   DVT of lower extremity (deep venous thrombosis) (HCC) 01/03/1990    PCP: Elliot Charm, MD   REFERRING PROVIDER: Huey Redell Fallow, PA-C  REFERRING DIAG: M54.2 (ICD-10-CM) - Cervicalgia  THERAPY DIAG:  Cervicalgia  Strain of left trapezius muscle, initial encounter  Rationale for Evaluation and Treatment: Rehabilitation  ONSET DATE: July 2025  SUBJECTIVE:                                                                                                                                                                                                         SUBJECTIVE STATEMENT: Reports onset of L UT pain following port placement into R shoulder Hand dominance: Right  PERTINENT HISTORY:  07/21/2023 Assessment: - suspect left-sided cervical/ trapezius muscle spasm - reassured by absence of red flag signs/ symptoms at this time   Concurrent lymphedema PAIN:  Are you having pain? Yes: NPRS scale: 8/10 Pain location: L UT Pain description: ache, cramp Aggravating factors: activity Relieving factors: heat, medication  PRECAUTIONS: None  RED FLAGS: None     WEIGHT BEARING RESTRICTIONS: No  FALLS:  Has patient fallen in last 6 months? No  OCCUPATION: [employer] Employee  PLOF: Independent  PATIENT GOALS: To manage my neck pain  NEXT MD VISIT: TBD  OBJECTIVE:  Note: Objective measures were completed at Evaluation unless otherwise noted.  DIAGNOSTIC FINDINGS:  none  PATIENT SURVEYS:  NDI: 28/50 56% perceived disability  Minimum Detectable Change (90% confidence): 5 points or 10% points   POSTURE: rounded shoulders and forward head  PALPATION: Global tenderness throughout B UT and posterior shoulder girdle   CERVICAL ROM:   Active ROM A/PROM (deg) eval  Flexion 75%P!  Extension 75%P!  Right lateral flexion 75%P!  Left lateral  flexion 75%P!  Right rotation 75%P!  Left rotation 75%P!   (Blank rows = not tested)  UPPER EXTREMITY ROM:  Active ROM  Right eval Left eval  Shoulder flexion 150 150  Shoulder extension    Shoulder abduction 150 150  Shoulder adduction    Shoulder extension    Shoulder internal rotation    Shoulder external rotation    Elbow flexion    Elbow extension    Wrist flexion    Wrist extension    Wrist ulnar deviation    Wrist radial deviation    Wrist pronation    Wrist supination     (Blank rows = not tested)  UPPER EXTREMITY MMT:  MMT Right eval Left eval  Shoulder flexion    Shoulder extension    Shoulder abduction    Shoulder adduction    Shoulder extension    Shoulder internal rotation    Shoulder external rotation    Middle trapezius    Lower trapezius    Elbow flexion    Elbow extension    Wrist flexion    Wrist extension    Wrist ulnar deviation    Wrist radial deviation    Wrist pronation    Wrist supination    Grip strength     (Blank rows = not tested)  CERVICAL SPECIAL TESTS:  Neck flexor muscle endurance test: Negative  FUNCTIONAL TESTS:  N/A  TREATMENT:                                                                                                                             [facility] Adult PT Treatment:                                                DATE: 08/02/23 Eval and HEP Self Care: Additional minutes spent for educating on updated Therapeutic Home Exercise Program as well as comparing current status to condition at start of symptoms. This included exercises focusing on stretching, strengthening, with focus on eccentric aspects. Long term goals include an improvement in range of motion, strength, endurance as well as avoiding reinjury. Patient's frequency would include in 1-2 times a day, 3-5 times a week for a duration of 6-12 weeks. Proper technique shown and discussed handout in great detail. All questions were discussed and addressed.       PATIENT EDUCATION:  Education details: Discussed eval findings, rehab rationale and POC and patient is in agreement  Person educated: Patient Education method: Explanation and Handouts Education comprehension: verbalized understanding and needs further education  HOME EXERCISE PROGRAM: Access Code: [email or web address] URL: [email or web address] Date: 08/02/2023 Prepared by: Reyes Kohut  Exercises - Seated Shoulder Shrugs  - 2 x daily - 5 x weekly - 2 sets - 10 reps - Seated Scapular Retraction  - 2 x daily - 5 x weekly - 2 sets - 10 reps - Seated Shoulder Horizontal Abduction with Resistance - Palms Down  - 2 x daily - 5 x  weekly - 2 sets - 10 reps  ASSESSMENT:  CLINICAL IMPRESSION: Patient is a 53 y.o. female who was seen today for physical therapy evaluation and treatment for L UT strain.  Patient presents with decreased cervical ROM due to pain in all planes.  No radicular signs elicited.  Neck flexor endurance WNL but painful.  BUE ROM limited due to soft tissue discomfort.  Palpation finds marked tightness throughout posterior upper back and shoulder girdle, suspected to be chronic in nature and posture dependent.  Patient presents with S&S of vertebrogenic pain and subsequent muscle guarding in presence of potential RA flare-up.  OBJECTIVE IMPAIRMENTS: decreased activity tolerance, decreased mobility, decreased ROM, increased fascial restrictions, increased muscle spasms, impaired UE functional use, postural dysfunction, and pain.   ACTIVITY LIMITATIONS: carrying, lifting, bending, bed mobility, and reach over head  PERSONAL FACTORS: Age, Fitness, Past/current experiences, and 1 comorbidity: lymphedema are also affecting patient's functional outcome.   REHAB POTENTIAL: Good  CLINICAL DECISION MAKING: Stable/uncomplicated  EVALUATION COMPLEXITY: Low   GOALS: Goals reviewed with patient? No    SHORT TERM GOALS=LONG TERM GOALS: Target date: 10/03/23  Patient to  demonstrate independence in HEP  Baseline: [id number] Goal status: INITIAL  2.  Patient will acknowledge 4/10 pain at least once during episode of care   Baseline: 8/10 Goal status: INITIAL  3.  Patient will score at least 20/50 on NDI to signify clinically meaningful improvement in functional abilities.   Baseline: 28/50 Goal status: INITIAL  4.  160d AROM in B shoulder flexion/abduction Baseline: 150d Goal status: INITIAL  5.  75% AROM cervical spine with minimal pain Baseline: 75% with moderate pain Goal status: INITIAL   PLAN:  PT FREQUENCY: 1-2x/week  PT DURATION: 6 weeks  PLANNED INTERVENTIONS: 97110-Therapeutic exercises, 97530- Therapeutic activity, 97112- Neuromuscular re-education, 97535- Self Care, and [id number]- Manual therapy  PLAN FOR NEXT SESSION: HEP review and update, manual techniques as appropriate, aerobic tasks, ROM and flexibility activities, strengthening and PREs, TPDN, gait and balance training as needed     Reyes CHRISTELLA Kohut, PT 08/02/2023, 5:41 PM

## 2023-08-03 ENCOUNTER — Ambulatory Visit: Admitting: Occupational Therapy

## 2023-08-03 DIAGNOSIS — I890 Lymphedema, not elsewhere classified: Secondary | ICD-10-CM

## 2023-08-03 NOTE — Therapy (Signed)
 OUTPATIENT OCCUPATIONAL THERAPY TREATMENT NOTE  BLE/ BLQ LYMPHEDEMA  Patient Name: Karen [name] MRN: [id number] DOB:06/25/1970, 53 y.o., female Today's Date: 08/03/2023  REPORTING PERIOD:  END OF SESSION:   OT End of Session - 08/03/23 0810     Visit Number 54    Number of Visits 72    Date for OT Re-Evaluation 08/17/23    OT Start Time 0802    OT Stop Time 0910    OT Time Calculation (min) 68 min    Activity Tolerance Patient tolerated treatment well;No increased pain    Behavior During Therapy [facility] for tasks assessed/performed            Past Medical History:  Diagnosis Date   Adenomyosis    Anemia    Arthritis 2013   L knee gets steroid injections    Arthritis, rheumatic, acute or subacute    Asthmatic bronchitis 01/31/2017   Back pain    Chronic constipation    Diarrhea    DVT of lower extremity (deep venous thrombosis) (HCC)    Dysmenorrhea    Endometriosis    Esophageal stricture    G6PD deficiency    GERD (gastroesophageal reflux disease)    History of kidney stones    History of uterine fibroid    IBS (irritable bowel syndrome)    Interstitial cystitis    Lactose intolerance    Migraine    with aura   Other hemochromatosis 06/10/2021   PONV (postoperative nausea and vomiting)    Recurrent upper respiratory infection (URI)    Sebaceous cyst of breast, right lower inner quadrant 10/25/2012   Excised 11/21/12    Sleep apnea    Syncope, non cardiac    Past Surgical History:  Procedure Laterality Date   ARTHROSCOPIC HAGLUNDS REPAIR     BREAST CYST EXCISION Right 11/21/2012   Procedure: CYST EXCISION BREAST;  Surgeon: Sherlean JINNY Laughter, MD;  Location: [facility];  Service: General;  Laterality: Right;   BREAST CYST EXCISION Bilateral 01/18/2022   Procedure: EXCISION OF SEBACEOUS CYST BILATERAL BREAST;  Surgeon: Vanderbilt Ned, MD;  Location: [facility];  Service: General;  Laterality: Bilateral;   BREAST EXCISIONAL  BIOPSY Right 11/2012   CESAREAN SECTION  04/19/1993   CHOLECYSTECTOMY     COLONOSCOPY  05/02/2011   Procedure: COLONOSCOPY;  Surgeon: Lynwood LITTIE Celestia Mickey., MD;  Location: [facility];  Service: Endoscopy;  Laterality: N/A;   colonoscopy  11/04/2014   DENTAL SURGERY  03/06/2018   2 surgeries ( 03/06/2018 and 04/06/2018)   to remove two separate benign tumors   ESOPHAGEAL MANOMETRY N/A 11/04/2015   Procedure: ESOPHAGEAL MANOMETRY (EM);  Surgeon: Lynwood Celestia, MD;  Location: [facility];  Service: Endoscopy;  Laterality: N/A;   ESOPHAGOGASTRODUODENOSCOPY  05/02/2011   Procedure: ESOPHAGOGASTRODUODENOSCOPY (EGD);  Surgeon: Lynwood LITTIE Celestia Mickey., MD;  Location: [facility];  Service: Endoscopy;  Laterality: N/A;   ESOPHAGOGASTRODUODENOSCOPY N/A 09/01/2014   Procedure: ESOPHAGOGASTRODUODENOSCOPY (EGD);  Surgeon: Lynwood Celestia, MD;  Location: [facility];  Service: Endoscopy;  Laterality: N/A;   IR IMAGING GUIDED PORT INSERTION  07/06/2023   KNEE SURGERY     LAPAROSCOPY     x 4   LESION REMOVAL N/A 01/18/2022   Procedure: EXCISION OF SEBACEOUS CYSTS CHEST AND NECK;  Surgeon: Vanderbilt Ned, MD;  Location: [facility];  Service: General;  Laterality: N/A;   PH IMPEDANCE STUDY N/A 11/04/2015   Procedure: PH IMPEDANCE STUDY;  Surgeon: Lynwood Celestia, MD;  Location: [facility];  Service: Endoscopy;  Laterality: N/A;   VAGINAL HYSTERECTOMY  2010   TVH--ovaries remain   Patient Active Problem List   Diagnosis Date Noted   Sjogren syndrome with keratoconjunctivitis (HCC) 12/09/2022   Other hemochromatosis 06/10/2021   Elevated LFTs 04/04/2021   Colitis 04/04/2021   Bacterial overgrowth syndrome 05/21/2020   Obesity 05/21/2020   History of endometriosis 05/21/2020   Constipation due to outlet dysfunction 02/05/2020   Gastroesophageal reflux disease 02/05/2020   Obstructive sleep apnea treated with continuous positive airway pressure (CPAP) 06/16/2017   Perennial allergic rhinitis  with a nonallergic component 01/31/2017   Asthmatic bronchitis 01/31/2017   GI symptoms 01/31/2017   Family history of colon cancer 01/27/2015   Chest pain 12/13/2012   Dyspnea 12/13/2012   DVT of lower extremity (deep venous thrombosis) (HCC) 01/03/1990    PCP: Gaile New, MD  REFERRING PROVIDER: Valery Ripple, MD  REFERRING DIAG: I89.0  THERAPY DIAG:  Lymphedema, not elsewhere classified  Rationale for Evaluation and Treatment: Rehabilitation  ONSET DATE: ~2014; exacerbation in 2023  SUBJECTIVE:                                                                                                                                                                                          SUBJECTIVE STATEMENT:  Pt presents for OT to address lower quadrant, including deep abdominal lymphatics, and LLE lymphedema w/ associated pain. Pt reports she and he daughter were able to practice MLD at home after last session and it went well. Pt reports 4/10 in her legs (aching) and back,  and upset stomach with sensation of fullness in abdominal region.   PERTINENT HISTORY: CVI, OSA (no CPAP), GERD, Esophageal stricture, IBS, Lactose intolerant, Diarrhea, Hx LE DVT, recent RA dx  PAIN:  Are you having pain? Yes: 4/10 Pain location: LLE, digestive discomfort Pain description: myalgia, tight, heavy, tingling, numbness Aggravating factors: standing, walking, dependent sitting > 30 minutes Relieving factors: elevation  PRECAUTIONS: Other: LYMPHEDEMA; Hx LLE DVT  WEIGHT BEARING RESTRICTIONS: No  FALLS:  Has patient fallen in last 6 months? No  LIVING ENVIRONMENT: Lives with: lives with spouse and daughter Lives in: House/apartment Stairs: Yes; Internal: 14 steps; yes and External: 4 steps; yes Has following equipment at home: None  OCCUPATION: [employer] operational officer, walking, desk work  LEISURE: family time  HAND DOMINANCE: right   PRIOR LEVEL OF FUNCTION: Independent  PATIENT  GOALS: Feel better, be able to be more active without pain, wear preferred street shoes. NEW 08/03/23: Be able to manage digestive discomfort and gut inflammation symptoms without medications  OBJECTIVE: Note: Objective measures were completed  at Evaluation unless otherwise noted.  COGNITION:  Overall cognitive status: Within functional limits for tasks assessed   POSTURE: WFL  LE ROM: WFL  LE MMT: WFL  LYMPHEDEMA ASSESSMENTS: non-Ca related  INFECTIONS: denies cellulitis and wound hx  BLE COMPARATIVE LIMB VOLUMETRICS Initial 11/15/22  LANDMARK RIGHT (dominant)  R LEG (A-D) 3028.9 ml  R THIGH (E-G) 4809.60 ml  R FULL LIMB (A-G) 7838.5 ml  Limb Volume differential (LVD)  %  Volume change since initial %  Volume change overall V  (Blank rows = not tested)  LANDMARK LEFT  (Rx)  L LEG (A-D) 3189.9 ml  L THIGH (E-G) 4959.8 ml  L FULL LIMB (A-G) 8149.7 ml  Limb Volume differential (LVD)  LEG LVD = 5.32%, L>R; THIGH LVD = 3.12%, L>R; And full LLE LVD = 3.97%, L>R.   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  Skin  Description Hyper-Keratosis Peau d' Orange Shiny Tight Fibrotic/ Indurated Fatty Doughy Spongy/ boggy       R>L x  x   Skin dry Flaky WNL Macerated   mildly      Color Redness Varicosities Blanching Hemosiderin Stain Mottled        x   Odor Malodorous Yeast Fungal infection  WNL      x   Temperature Warm Cool wnl     x    Pitting Edema   1+ 2+ 3+ 4+ Non-pitting         x   Girth Symmetrical Asymmetrical                   Distribution    L>R toes to  groin    Stemmer Sign Positive Negative   +L -R   Lymphorrhea History Of:  Present Absent     x    Wounds History Of Present Absent Venous Arterial Pressure Sheer     x        Signs of Infection Redness Warmth Erythema Acute Swelling Drainage Borders                    Sensation Light Touch Deep pressure Hypersensitivity   In tact Impaired In tact Impaired Absent Impaired   x  x  x     Nails WNL   Fungus nail dystrophy   x     Hair Growth Symmetrical Asymmetrical   x    Skin Creases Base of toes  Ankles   Base of Fingers knees       Abdominal pannus Thigh Lobules  Face/neck   x           GAIT: Distance walked: >500' Assistive device utilized: None Level of assistance: Complete Independence Comments: Functional ambulation and transfers [facility]  LYMPHEDEMA LIFE IMPACT SCALE (LLIS): Initial 11/15/22  50%  FOTO functional outcome measure: Deferred. FOTO discontinued.  TODAY'S TREATMENT:  MLD to BLQ emphasis on colon and LLE Pt edu for progress towards goals, new goals and LE SELF CARE  PATIENT EDUCATION:  Continued Pt/ CG edu for how function of cisterna chyli, part of the thoracic duct of deep abdominal lymphatics, assists with digestion by filtering many of the fats and proteins from the digestive system. Sub optimal lymphatic flow in this region may result in constipation, bloating, swelling, etc. MLD helps mobilize these protein and fats , and the rhythmic movement of manual lymphatic drainage stimulates digestive muscles and increase peristalsis. Provided printed resource for reference.  Topics include outcome of comparative limb volumetrics- starting limb volume differentials (LVDs), technology and gradient techniques used for short stretch, multilayer compression wrapping, simple self-MLD, therapeutic lymphatic pumping exercises,  skin/nail care, LE precautions,. compression garment recommendations and specifications, wear and care schedule and compression garment donning / doffing w assistive devices. Discussed progress towards all OT goals since commencing CDT. All questions answered to the Pt's satisfaction. Good return. Person educated: Patient  Education method: Explanation, Demonstration, and Handouts Education comprehension: verbalized understanding, returned demonstration, verbal cues required, and needs further education  HOME EXERCISE PROGRAM: BLE lymphatic pumping there ex using- 1 sets of 10 reps, each exercise in order-  1-2 x daily, bilaterally Simple self MLD 1 x daily Daily skin care to increase hydration, skin mobility and decrease infection risk- can be done during MLD Compression wraps 23/7 during intensive phase of CDT Compression garments/ devices during self-management phase of CDT  ASSESSMENT:  CLINICAL IMPRESSION: Provided Pt education throughout session. Pt and OT discussed typical rehab trajectory, process of goal setting and reviewed progress to date. Pt verbalized understanding that OT is typically discharged when all rehab goals are met , or when progress plateaus. Pt and OT discussed adding new goals, including management of inflammatory gut symptoms as much as possible with manual therapies instead of medications, and reducing gut pain/ discomfort to no more than 2 or 3/10. We'll finalize new goals next session.  Continued MLD to deep abdominal lymphatics combined with diaphragmatic breathing. Finished with stationary circle to descending, transverse and ascending colon. Pt tolerated manual therapy without pain.     Pt continues to benefit from MLD to reduce chronic inflammation and toxins from the intestinal area. Although more research on MLD's effectiveness in treating IBS is needed to determine its efficacy conclusively, preliminary findings suggest that MLD offers symptomatic relief for  some patients. To date, MLD to abdominal lymphatics has helped to stimulate peristalsis and to stimulate the parasympathetic nervous system such that Pt has been able to suspend frequent intestinal cleanses, reports decreased frequency of constipation and more regular bowel movements with regular MLD.Cont  1 x weekly as per POC.  11/15/22 Initial Evaluation: Karen [name] is a 53 y.o. female presenting with very mild, stage II, LLE lymphedema 2/2 venous insufficiency and obesity. L Leg swelling and associated pain swelling fluctuates. It has progressed over time, and now no longer resolves with elevation over night. RLE swelling limits balance during functional ambulation. It exacerbates infection and falls risk. LLE/RLQ lymphedema interferes with functional performance in all occupational domains, including basic and instrumental ADLs, productive activities, leisure pursuits and social participation. Pt will benefit from Occupational Therapy for Complete Decongestive Therapy (CDT) to restore function, to reduce physical and psychologic suffering associated with chronic, progressive lymphedema and associated pain, and to limit infection. CDT will include manual lymphatic drainage (MLD), skin care, therapeutic exercise and compression wraps and garment's devices. Without skilled OT for  lymphedema care the condition will worsen and further functional decline is expected  Custom-made gradient compression garments and HOS devices are medically necessary because they are uniquely sized and shaped to fit the exact dimensions of the affected extremities, and to provide appropriate medical grade, graduated compression essential for optimally managing chronic, progressive lymphedema. Multiple custom compression garments are needed to ensure proper hygiene to limit infection risk. Custom compression garments should be replaced q 3-6 months When worn consistently for optimal lipo-lymphedema self-management over time. HOS  devices, medically necessary to limit fibrosis buildup in tissue, should be replaced q 2 years and PRN when worn out.      OBJECTIVE IMPAIRMENTS: decreased activity tolerance, decreased knowledge of condition, decreased knowledge of use of DME, increased edema, impaired sensation, pain, and chronic, progressive leg swelling.   ACTIVITY LIMITATIONS: dependent sitting, extended standing and/ or walking, squatting, and lower body dressing, fitting preferred street shoes  PARTICIPATION LIMITATIONS: shopping, community activity, occupation, and yard work  PERSONAL FACTORS: 3+ comorbidities: Hx DVT,  OSA, CVI. Varicose veins are also affecting patient's functional outcome.   REHAB POTENTIAL: Good  EVALUATION COMPLEXITY: Moderate   GOALS: Goals reviewed with patient? Yes  SHORT TERM GOALS: Target date: 4th OT Rx visit  SHORT TERM GOALS: Target date: 4th OT Rx visit   Pt will demonstrate understanding of lymphedema precautions and prevention strategies with modified independence using a printed reference to identify at least 5 precautions and discussing how s/he may implement them into daily life to reduce risk of progression with extra time. Baseline:Max A Goal status: GOAL MET   Pt will be able to apply multilayer, knee length, gradient, compression wraps to one leg at a time with modified assistance (extra time and assistive device/s) to decrease limb volume, to limit infection risk, and to limit lymphedema progression.  Baseline: Dependent Goal status: GOAL DISCHARGED. Pt Going to compression garments instead  LONG TERM GOALS: Target date: 02/08/23  Given this patient's Intake score of 67%/100% on the functional outcomes FOTO tool, patient will experience an increase in function of 5 points to improve basic and instrumental ADLs performance, including lymphedema self-care.  Baseline: Max A Goal status:GOAL DISCHARGED.  FOTO TOOL DISCONTINUED AT CLINIC   Given this patient's Intake  score of 50% on the Lymphedema Life Impact Scale (LLIS), patient will experience a reduction of at least 5 points in her perceived level of functional impairment resulting from lymphedema to improve functional performance and quality of life (QOL). Baseline: 50% Goal status: PROGRESSING  Pt will achieve at least a 10% volume reduction in B legs to return limb to typical size and shape, to limit infection risk and LE progression, to decrease pain, to improve function. Baseline: Dependent Goal status: PROGRESSING  4.  Pt will obtain proper compression garments/devices and achieve modified independence (extra time + assistive devices) with donning/doffing to optimize limb volume reductions and limit LE  progression over time. Baseline:  Goal status:GOAL MET  5.  During Intensive phase CDT , with modified independence, Pt will achieve at least 85% compliance with all lymphedema self-care home program components, including daily skin care, compression wraps and /or garments, simple self MLD and lymphatic pumping therex to habituate LE self care protocol  into ADLs for optimal LE self-management over time. Baseline: Dependent Goal status:GOAL MET   PLAN:  OT FREQUENCY: 1 /week  OT DURATION: 12 weeks and PRN  PLANNED INTERVENTIONS: 97110-Therapeutic exercises, 97530- Therapeutic activity, 97535- Self Care, [id number]- Manual therapy, Patient/Family education, Manual  lymph drainage, Compression bandaging, and fit with appropriate compression garments  PLAN FOR NEXT SESSION:  Cont MLD as established Pt and family edu for LE self-care  Zebedee Dec, MS, OTR/L, CLT-LANA 08/03/23 11:16 AM

## 2023-08-07 ENCOUNTER — Ambulatory Visit: Payer: Self-pay | Attending: Obstetrics & Gynecology

## 2023-08-07 DIAGNOSIS — R279 Unspecified lack of coordination: Secondary | ICD-10-CM | POA: Insufficient documentation

## 2023-08-07 DIAGNOSIS — M5459 Other low back pain: Secondary | ICD-10-CM | POA: Insufficient documentation

## 2023-08-07 DIAGNOSIS — M62838 Other muscle spasm: Secondary | ICD-10-CM | POA: Diagnosis not present

## 2023-08-07 DIAGNOSIS — M6281 Muscle weakness (generalized): Secondary | ICD-10-CM | POA: Insufficient documentation

## 2023-08-07 DIAGNOSIS — R102 Pelvic and perineal pain: Secondary | ICD-10-CM | POA: Diagnosis not present

## 2023-08-07 NOTE — Therapy (Signed)
 OUTPATIENT PHYSICAL THERAPY FEMALE PELVIC TREATMENT   Patient Name: Karen [name] MRN: [id number] DOB:12/26/1970, 53 y.o., female Today's Date: 08/07/2023  END OF SESSION:  PT End of Session - 08/07/23 0801     Visit Number 46    Date for PT Re-Evaluation 10/10/23    Authorization Type [name]    PT Start Time 0800    PT Stop Time [phone]    PT Time Calculation (min) 41 min    Activity Tolerance Patient tolerated treatment well    Behavior During Therapy [facility] for tasks assessed/performed                     Past Medical History:  Diagnosis Date   Adenomyosis    Anemia    Arthritis 2013   L knee gets steroid injections    Arthritis, rheumatic, acute or subacute    Asthmatic bronchitis 01/31/2017   Back pain    Chronic constipation    Diarrhea    DVT of lower extremity (deep venous thrombosis) (HCC)    Dysmenorrhea    Endometriosis    Esophageal stricture    G6PD deficiency    GERD (gastroesophageal reflux disease)    History of kidney stones    History of uterine fibroid    IBS (irritable bowel syndrome)    Interstitial cystitis    Lactose intolerance    Migraine    with aura   Other hemochromatosis 06/10/2021   PONV (postoperative nausea and vomiting)    Recurrent upper respiratory infection (URI)    Sebaceous cyst of breast, right lower inner quadrant 10/25/2012   Excised 11/21/12    Sleep apnea    Syncope, non cardiac    Past Surgical History:  Procedure Laterality Date   ARTHROSCOPIC HAGLUNDS REPAIR     BREAST CYST EXCISION Right 11/21/2012   Procedure: CYST EXCISION BREAST;  Surgeon: Sherlean JINNY Laughter, MD;  Location: [facility];  Service: General;  Laterality: Right;   BREAST CYST EXCISION Bilateral 01/18/2022   Procedure: EXCISION OF SEBACEOUS CYST BILATERAL BREAST;  Surgeon: Vanderbilt Ned, MD;  Location: [facility];  Service: General;  Laterality: Bilateral;   BREAST EXCISIONAL BIOPSY Right  11/2012   CESAREAN SECTION  04/19/1993   CHOLECYSTECTOMY     COLONOSCOPY  05/02/2011   Procedure: COLONOSCOPY;  Surgeon: Lynwood LITTIE Celestia Mickey., MD;  Location: [facility];  Service: Endoscopy;  Laterality: N/A;   colonoscopy  11/04/2014   DENTAL SURGERY  03/06/2018   2 surgeries ( 03/06/2018 and 04/06/2018)   to remove two separate benign tumors   ESOPHAGEAL MANOMETRY N/A 11/04/2015   Procedure: ESOPHAGEAL MANOMETRY (EM);  Surgeon: Lynwood Celestia, MD;  Location: [facility];  Service: Endoscopy;  Laterality: N/A;   ESOPHAGOGASTRODUODENOSCOPY  05/02/2011   Procedure: ESOPHAGOGASTRODUODENOSCOPY (EGD);  Surgeon: Lynwood LITTIE Celestia Mickey., MD;  Location: [facility];  Service: Endoscopy;  Laterality: N/A;   ESOPHAGOGASTRODUODENOSCOPY N/A 09/01/2014   Procedure: ESOPHAGOGASTRODUODENOSCOPY (EGD);  Surgeon: Lynwood Celestia, MD;  Location: [facility];  Service: Endoscopy;  Laterality: N/A;   IR IMAGING GUIDED PORT INSERTION  07/06/2023   KNEE SURGERY     LAPAROSCOPY     x 4   LESION REMOVAL N/A 01/18/2022   Procedure: EXCISION OF SEBACEOUS CYSTS CHEST AND NECK;  Surgeon: Vanderbilt Ned, MD;  Location: [facility];  Service: General;  Laterality: N/A;   PH IMPEDANCE STUDY N/A 11/04/2015   Procedure: PH IMPEDANCE STUDY;  Surgeon: Lynwood Celestia,  MD;  Location: [facility];  Service: Endoscopy;  Laterality: N/A;   VAGINAL HYSTERECTOMY  2010   TVH--ovaries remain   Patient Active Problem List   Diagnosis Date Noted   Sjogren syndrome with keratoconjunctivitis (HCC) 12/09/2022   Other hemochromatosis 06/10/2021   Elevated LFTs 04/04/2021   Colitis 04/04/2021   Bacterial overgrowth syndrome 05/21/2020   Obesity 05/21/2020   History of endometriosis 05/21/2020   Constipation due to outlet dysfunction 02/05/2020   Gastroesophageal reflux disease 02/05/2020   Obstructive sleep apnea treated with continuous positive airway pressure (CPAP) 06/16/2017   Perennial allergic rhinitis with a  nonallergic component 01/31/2017   Asthmatic bronchitis 01/31/2017   GI symptoms 01/31/2017   Family history of colon cancer 01/27/2015   Chest pain 12/13/2012   Dyspnea 12/13/2012   DVT of lower extremity (deep venous thrombosis) (HCC) 01/03/1990    PCP: Elliot Charm, MD  REFERRING PROVIDER: Cleotilde Ronal RAMAN, MD   REFERRING DIAG: M62.89 (ICD-10-CM) - Pelvic floor dysfunction  THERAPY DIAG:  Other muscle spasm  Muscle weakness (generalized)  Unspecified lack of coordination  Other low back pain  Rationale for Evaluation and Treatment: Rehabilitation  ONSET DATE: chronic  SUBJECTIVE:                                                                                                                                                                                           SUBJECTIVE STATEMENT:  Pt states that she had second injection of Humira  and she had a skin reaction so they will likely take her off of that, but she is waiting to hear from her doctor. She has also been having headaches that are very severe. She states that after our session last week she had very good bowel movements and did not have to take Miralax .   PAIN: 08/07/23 Are you having pain? Yes NPRS scale: 4/10 Pain location: Lower abdominal pain, Lt sided pain, low back pain  Pain type: turning, knotted, pressure Pain description: intermittent and constant   Aggravating factors: constipation or frequent bowel movements/constantly having bowel movements  Relieving factors: exercises (bowel massage, happy baby, spinal twists, walking)  PRECAUTIONS: None  WEIGHT BEARING RESTRICTIONS: No  FALLS:  Has patient fallen in last 6 months? No  LIVING ENVIRONMENT: Lives with: lives with their spouse Lives in: House/apartment  OCCUPATION: nurse, in admin  PLOF: Independent  PATIENT GOALS: decrease pain and have more normal bowel movements  PERTINENT HISTORY:  Vaginal hysterectomy, DVT LE,  endometriosis, interstitial cystitis, exploratory laps for endometriosis, c-section, cholecystectomy, RA diagnosed 06/2023 Sexual abuse: No  BOWEL MOVEMENT: Pain with bowel movement: Yes Type of bowel movement:Type (  Bristol Stool Scale) 1-7 (sometimes full range in the same bowel movement), Frequency sometimes many times a day, sometimes up to 4 days in between, and Strain Yes Fully empty rectum: No Leakage: No Pads: No Fiber supplement: No - has attempted metamucil and it made constipation worse  URINATION: Pain with urination: Yes Fully empty bladder: No Stream: varies  Urgency: Yes: all the time Frequency: multiple times an hours  Leakage: Urge to void, Walking to the bathroom, Coughing, Sneezing, Laughing, and Exercise Pads: Yes: daily  INTERCOURSE: Pain with intercourse: Initial Penetration and During Penetration Ability to have vaginal penetration:  Yes: with pain Climax: non painful WNL   PREGNANCY: Vaginal deliveries 1 Tearing Yes: 3rd degree tear C-section deliveries 1 Currently pregnant No  PROLAPSE: Periodically will feel vaginal bulging, heaviness in lower abdomen   OBJECTIVE:  05/04/23:               Internal Pelvic Floor: mild tenderness in bil levator ani, but no increase in tension; with internal rectal exam, she was demonstrating paradoxical contraction when trying to eccentrically lengthen with exhale and had very hard time correcting  Patient confirms identification and approves PT to assess internal pelvic floor and treatment Yes  PELVIC MMT:   MMT eval  Vaginal 2/5, 3 second hold, 3 repeat contractions  Internal Anal Sphincter 1/5  External Anal Sphincter 2/5  Puborectalis 1/5  Diastasis Recti   (Blank rows = not tested)        TONE: WNL   PROLAPSE: Grade 2 anterior vaginal wall laxity  04/25/23: Curl-up test: abdominal distortion Transversus abdominus: week, difficulty getting active contraction (not been working on  strengthening)    LUMBARAROM/PROM:  A/PROM A/PROM  Eval (% available) 11/09/22 (% available) 04/25/23 (% available)  Flexion 50 90 90  Extension 25, pain anteriorly  25, pain in low back 50  Right lateral flexion 50, pain anteriorly  75, pain on right 50, pain on Rt  Left lateral flexion 50, pain anteriorly  75, pain on right 75   (Blank rows = not tested)  11/09/22: No internal rectal or vaginal pelvic floor exam performed due to high level of skin irritation   Weak transversus abdominus contraction  Abdominal restriction and tenderness in bil lower quadrant  Unable to perform pelvic tilt with appropriate coordination  LUMBARAROM/PROM:  A/PROM A/PROM  Eval (% available) 11/09/22 (% available)  Flexion 50 90  Extension 25, pain anteriorly  25, pain in low back  Right lateral flexion 50, pain anteriorly  75, pain on right  Left lateral flexion 50, pain anteriorly  75, pain on right   (Blank rows = not tested) 07/21/22: standing prolapse assessment demonstrated grade 2 anterior vaginal wall laxity   06/08/22:               External Perineal Exam: WNL                              Internal Pelvic Floor: burning reported with palpation of superficial muscles; deep aching/pressure with palpation of Lt levator ani   Patient confirms identification and approves PT to assess internal pelvic floor and treatment Yes  PELVIC MMT:   MMT eval  Vaginal 3/5  Internal Anal Sphincter 1/5  External Anal Sphincter 2/5  Puborectalis 1/5  Diastasis Recti   (Blank rows = not tested)        TONE: High in Lt levator ani   PROLAPSE: Not  able to tell this session due to dyssynergic pelvic floor contraction     06/01/22: COGNITION: Overall cognitive status: Within functional limits for tasks assessed     SENSATION: Light touch: Appears intact Proprioception: Appears intact  GAIT: Comments: decreased hip extension, forward flexed trunk  POSTURE: rounded shoulders, forward head,  decreased lumbar lordosis, increased thoracic kyphosis, posterior pelvic tilt, and flexed trunk    LUMBARAROM/PROM:  A/PROM A/PROM  Eval (% available)  Flexion 50  Extension 25, pain anteriorly   Right lateral flexion 50, pain anteriorly   Left lateral flexion 50, pain anteriorly    (Blank rows = not tested)   PALPATION:   General  Significant abdominal restriction and tenderness; decreased rib mobility with mobilization and breathing    TODAY'S TREATMENT:                                                                                                                              DATE:  08/07/23 Manual: ILU bowel mobilization  Abdominal scar tissue mobilization Ileocecal valve mobilization External rectum mobilization Exercises: Supine bridge with hip adduction 2 x 10 Supine hip abduction red band 2 x 10 Supine piriformis stretch 60 sec bil Supine resisted march red band 2 x 10 Butterfly stretch 60 sec Lower trunk rotation 2 x 10 Single knee to chest 5x bil   08/01/23 Manual: ILU bowel mobilization  Abdominal scar tissue mobilization Ileocecal valve mobilization External rectum mobilization Exercises: Supine bridge with hip adduction 2 x 10 Supine hip abduction red band 2 x 10 Supine piriformis stretch 60 sec bil Supine resisted march red band 2 x 10 Butterfly stretch 60 sec Lower trunk rotation 2 x 10 Single knee to chest 5x bil   06/07/23 Manual: ILU bowel mobilization  Abdominal scar tissue mobilization Ileocecal valve mobilization External rectum mobilization Therapeutic activities: Education on normal motility and that based on post-cleanse she demonstrates normal based on time frame Education on consistency of bowel movements remaining at 4-5 on bristol stool scale, continuing to have 2-3 bowel movements a day, and sensation of complete emptying or she needs to talk with doctor about returning to laxative program/cleanse in order to allow for colon/rectum  healing    PATIENT EDUCATION:  Education details: See above Person educated: Patient Education method: Programmer, multimedia, Demonstration, Tactile cues, Verbal cues, and Handouts Education comprehension: verbalized understanding  HOME EXERCISE PROGRAM: [id number]  ASSESSMENT:  CLINICAL IMPRESSION: Pt had very good improvement in bowel movements after last session and reports that she was able to empty bowels much better. Due to this, we continued manual treatment techniques with good tolerance today. We also performed mobility exercises and gentle strengthening to help improve circulation and posture in order to further improve bowel movements. She will continue to benefit from skilled PT intervention in order to decrease pain, improve bowel movements, and decrease urinary urgency/incontinence.     OBJECTIVE IMPAIRMENTS: decreased activity tolerance, decreased coordination, decreased endurance, decreased mobility, decreased strength,  increased fascial restrictions, increased muscle spasms, impaired tone, postural dysfunction, and pain.   ACTIVITY LIMITATIONS: lifting, bending, continence, and locomotion level  PARTICIPATION LIMITATIONS: interpersonal relationship, community activity, and occupation  PERSONAL FACTORS: 1 comorbidity: medical history are also affecting patient's functional outcome.   REHAB POTENTIAL: Good  CLINICAL DECISION MAKING: Stable/uncomplicated  EVALUATION COMPLEXITY: Low   GOALS: Goals reviewed with patient? Yes  SHORT TERM GOALS: Updated 08/01/23  Pt will be independent with HEP.   Baseline: Goal status: MET 07/12/22  2.  Pt will be independent with diaphragmatic breathing and down training activities in order to improve pelvic floor relaxation.  Baseline:  Goal status: MET 07/12/22  3.  Pt will be independent with the knack, urge suppression technique, and double voiding in order to improve bladder habits and decrease urinary incontinence.   Baseline:   Goal status: MET 09/22/22  4.  Pt will be independent with use of squatty potty, relaxed toileting mechanics, and improved bowel movement techniques in order to increase ease of bowel movements and complete evacuation.   Baseline:  Goal status: MET 11/09/22  5.  Pt will be able to correctly perform diaphragmatic breathing and appropriate pressure management in order to prevent worsening vaginal wall laxity and improve pelvic floor A/ROM.   Baseline:  Goal status: MET 01/18/23  LONG TERM GOALS: Updated 08/01/23  Pt will be independent with advanced HEP.   Baseline:  Goal status: IN PROGRESS 08/01/23  2.  Pt will demonstrate normal pelvic floor muscle tone and A/ROM, able to achieve 4/5 strength with contractions and 10 sec endurance, in order to provide appropriate lumbopelvic support in functional activities.   Baseline: not assessed today per patient request  Goal status: IN PROGRESS 08/01/23  3.  Pt will increase all impaired lumbar A/ROM by 25% without pain.  Baseline: Pt seen a little bit of loss in some and improvement in extension Goal status:  IN PROGRESS 08/01/23  4.  Pt will report pain no higher than 3/10 with any activity. Baseline: pain has gotten up to 5/10, but currently at a 3/10 Goal status:  IN PROGRESS 08/01/23  5.  Pt will report 0/10 pain with vaginal penetration in order to improve intimate relationship with partner.    Baseline: no recent intercourse attempts  Goal status:  IN PROGRESS 08/01/23  6.  Pt will be able to go 2-3 hours in between voids without urgency or incontinence in order to improve QOL and perform all functional activities with less difficulty.   Baseline:  Goal status: MET 02/21/23  7.  Pt will report no leaks with laughing, coughing, sneezing in order to improve comfort with interpersonal relationships and community activities.   Baseline: Pt states that she feels 30-40% better Goal status:  IN PROGRESS 08/01/23  8.  Pt will have  consistent bowel movements 4-5x/week without straining or pain.  Baseline:  Goal status:  MET 02/21/23  PLAN:  PT FREQUENCY: 1-2x/week  PT DURATION: 6 months  PLANNED INTERVENTIONS: Therapeutic exercises, Therapeutic activity, Neuromuscular re-education, Balance training, Gait training, Patient/Family education, Self Care, Joint mobilization, Dry Needling, Biofeedback, and Manual therapy  PLAN FOR NEXT SESSION: strengthening program - reinforce importance and go over some exercises each session; internal pelvic floor muscle exam  Josette Mares, PT, DPT08/04/258:42 AM

## 2023-08-08 ENCOUNTER — Other Ambulatory Visit (HOSPITAL_COMMUNITY): Payer: Self-pay

## 2023-08-08 ENCOUNTER — Ambulatory Visit: Attending: Orthopedic Surgery

## 2023-08-08 ENCOUNTER — Encounter: Payer: Self-pay | Admitting: Hematology & Oncology

## 2023-08-08 DIAGNOSIS — M542 Cervicalgia: Secondary | ICD-10-CM | POA: Insufficient documentation

## 2023-08-08 DIAGNOSIS — M62838 Other muscle spasm: Secondary | ICD-10-CM | POA: Diagnosis not present

## 2023-08-08 DIAGNOSIS — M6281 Muscle weakness (generalized): Secondary | ICD-10-CM | POA: Insufficient documentation

## 2023-08-08 DIAGNOSIS — S29012A Strain of muscle and tendon of back wall of thorax, initial encounter: Secondary | ICD-10-CM | POA: Insufficient documentation

## 2023-08-08 DIAGNOSIS — S46812A Strain of other muscles, fascia and tendons at shoulder and upper arm level, left arm, initial encounter: Secondary | ICD-10-CM

## 2023-08-08 MED ORDER — HUMIRA (2 PEN) 40 MG/0.4ML ~~LOC~~ AJKT
40.0000 mg | AUTO-INJECTOR | SUBCUTANEOUS | 5 refills | Status: DC
  Filled 2023-08-08 – 2023-08-14 (×3): qty 2, 28d supply, fill #0
  Filled 2023-09-06: qty 2, 28d supply, fill #1
  Filled 2023-10-04: qty 2, 28d supply, fill #2
  Filled 2023-11-02: qty 2, 28d supply, fill #3
  Filled 2023-11-28: qty 2, 28d supply, fill #4
  Filled 2023-12-22: qty 2, 28d supply, fill #5

## 2023-08-08 NOTE — Therapy (Signed)
 OUTPATIENT PHYSICAL THERAPY CERVICAL TREATMENT   Patient Name: Karen [name] MRN: [id number] DOB:22-May-1970, 53 y.o., female Today's Date: 08/08/2023  END OF SESSION:  PT End of Session - 08/08/23 1459     Visit Number 2   #2 for ortho   Date for PT Re-Evaluation 10/10/23    Authorization Type [name]    Authorization Time Period signed dry needling consent form 08/08/23    PT Start Time 1452    PT Stop Time 1533    PT Time Calculation (min) 41 min    Activity Tolerance Patient tolerated treatment well    Behavior During Therapy [facility] for tasks assessed/performed           Past Medical History:  Diagnosis Date   Adenomyosis    Anemia    Arthritis 2013   L knee gets steroid injections    Arthritis, rheumatic, acute or subacute    Asthmatic bronchitis 01/31/2017   Back pain    Chronic constipation    Diarrhea    DVT of lower extremity (deep venous thrombosis) (HCC)    Dysmenorrhea    Endometriosis    Esophageal stricture    G6PD deficiency    GERD (gastroesophageal reflux disease)    History of kidney stones    History of uterine fibroid    IBS (irritable bowel syndrome)    Interstitial cystitis    Lactose intolerance    Migraine    with aura   Other hemochromatosis 06/10/2021   PONV (postoperative nausea and vomiting)    Recurrent upper respiratory infection (URI)    Sebaceous cyst of breast, right lower inner quadrant 10/25/2012   Excised 11/21/12    Sleep apnea    Syncope, non cardiac    Past Surgical History:  Procedure Laterality Date   ARTHROSCOPIC HAGLUNDS REPAIR     BREAST CYST EXCISION Right 11/21/2012   Procedure: CYST EXCISION BREAST;  Surgeon: Sherlean JINNY Laughter, MD;  Location: [facility];  Service: General;  Laterality: Right;   BREAST CYST EXCISION Bilateral 01/18/2022   Procedure: EXCISION OF SEBACEOUS CYST BILATERAL BREAST;  Surgeon: Vanderbilt Ned, MD;  Location: [facility];  Service: General;   Laterality: Bilateral;   BREAST EXCISIONAL BIOPSY Right 11/2012   CESAREAN SECTION  04/19/1993   CHOLECYSTECTOMY     COLONOSCOPY  05/02/2011   Procedure: COLONOSCOPY;  Surgeon: Lynwood LITTIE Celestia Mickey., MD;  Location: [facility];  Service: Endoscopy;  Laterality: N/A;   colonoscopy  11/04/2014   DENTAL SURGERY  03/06/2018   2 surgeries ( 03/06/2018 and 04/06/2018)   to remove two separate benign tumors   ESOPHAGEAL MANOMETRY N/A 11/04/2015   Procedure: ESOPHAGEAL MANOMETRY (EM);  Surgeon: Lynwood Celestia, MD;  Location: [facility];  Service: Endoscopy;  Laterality: N/A;   ESOPHAGOGASTRODUODENOSCOPY  05/02/2011   Procedure: ESOPHAGOGASTRODUODENOSCOPY (EGD);  Surgeon: Lynwood LITTIE Celestia Mickey., MD;  Location: [facility];  Service: Endoscopy;  Laterality: N/A;   ESOPHAGOGASTRODUODENOSCOPY N/A 09/01/2014   Procedure: ESOPHAGOGASTRODUODENOSCOPY (EGD);  Surgeon: Lynwood Celestia, MD;  Location: [facility];  Service: Endoscopy;  Laterality: N/A;   IR IMAGING GUIDED PORT INSERTION  07/06/2023   KNEE SURGERY     LAPAROSCOPY     x 4   LESION REMOVAL N/A 01/18/2022   Procedure: EXCISION OF SEBACEOUS CYSTS CHEST AND NECK;  Surgeon: Vanderbilt Ned, MD;  Location: [facility];  Service: General;  Laterality: N/A;   PH IMPEDANCE STUDY N/A 11/04/2015   Procedure: PH IMPEDANCE  STUDY;  Surgeon: Lynwood Bohr, MD;  Location: [facility];  Service: Endoscopy;  Laterality: N/A;   VAGINAL HYSTERECTOMY  2010   TVH--ovaries remain   Patient Active Problem List   Diagnosis Date Noted   Sjogren syndrome with keratoconjunctivitis (HCC) 12/09/2022   Other hemochromatosis 06/10/2021   Elevated LFTs 04/04/2021   Colitis 04/04/2021   Bacterial overgrowth syndrome 05/21/2020   Obesity 05/21/2020   History of endometriosis 05/21/2020   Constipation due to outlet dysfunction 02/05/2020   Gastroesophageal reflux disease 02/05/2020   Obstructive sleep apnea treated with continuous positive airway pressure (CPAP)  06/16/2017   Perennial allergic rhinitis with a nonallergic component 01/31/2017   Asthmatic bronchitis 01/31/2017   GI symptoms 01/31/2017   Family history of colon cancer 01/27/2015   Chest pain 12/13/2012   Dyspnea 12/13/2012   DVT of lower extremity (deep venous thrombosis) (HCC) 01/03/1990    PCP: Elliot Charm, MD   REFERRING PROVIDER: Huey Redell Fallow, PA-C  REFERRING DIAG: M54.2 (ICD-10-CM) - Cervicalgia  THERAPY DIAG:  Other muscle spasm  Muscle weakness (generalized)  Cervicalgia  Strain of left trapezius muscle, initial encounter  Rationale for Evaluation and Treatment: Rehabilitation  ONSET DATE: July 2025  SUBJECTIVE:                                                                                                                                                                                                         SUBJECTIVE STATEMENT:   Eval: Reports onset of L UT pain following port placement into R shoulder Hand dominance: Right  PERTINENT HISTORY:  07/21/2023 Assessment: - suspect left-sided cervical/ trapezius muscle spasm - reassured by absence of red flag signs/ symptoms at this time   Concurrent lymphedema PAIN: 08/08/23 Are you having pain? Yes: NPRS scale: 5/10 Pain location: L UT Pain description: ache, cramp Aggravating factors: activity Relieving factors: heat, medication  PRECAUTIONS: None  RED FLAGS: None     WEIGHT BEARING RESTRICTIONS: No  FALLS:  Has patient fallen in last 6 months? No  OCCUPATION: [employer] Employee  PLOF: Independent  PATIENT GOALS: To manage my neck pain  NEXT MD VISIT: TBD  OBJECTIVE:  Note: Objective measures were completed at Evaluation unless otherwise noted.  DIAGNOSTIC FINDINGS:  none  PATIENT SURVEYS:  NDI: 28/50 56% perceived disability  Minimum Detectable Change (90% confidence): 5 points or 10% points   POSTURE: rounded shoulders and forward  head  PALPATION: Global tenderness throughout B UT and posterior shoulder girdle   CERVICAL ROM:   Active ROM A/PROM (deg) eval  Flexion  75%P!  Extension 75%P!  Right lateral flexion 75%P!  Left lateral flexion 75%P!  Right rotation 75%P!  Left rotation 75%P!   (Blank rows = not tested)  UPPER EXTREMITY ROM:  Active ROM Right eval Left eval  Shoulder flexion 150 150  Shoulder extension    Shoulder abduction 150 150  Shoulder adduction    Shoulder extension    Shoulder internal rotation    Shoulder external rotation    Elbow flexion    Elbow extension    Wrist flexion    Wrist extension    Wrist ulnar deviation    Wrist radial deviation    Wrist pronation    Wrist supination     (Blank rows = not tested)  UPPER EXTREMITY MMT:  MMT Right eval Left eval  Shoulder flexion    Shoulder extension    Shoulder abduction    Shoulder adduction    Shoulder extension    Shoulder internal rotation    Shoulder external rotation    Middle trapezius    Lower trapezius    Elbow flexion    Elbow extension    Wrist flexion    Wrist extension    Wrist ulnar deviation    Wrist radial deviation    Wrist pronation    Wrist supination    Grip strength     (Blank rows = not tested)  CERVICAL SPECIAL TESTS:  Neck flexor muscle endurance test: Negative  FUNCTIONAL TESTS:  N/A  TREATMENT                                                                                                                       DATE: 08/08/23 Trigger Point Dry Needling  Initial Treatment: Pt instructed on Dry Needling rational, procedures, and possible side effects. Pt instructed to expect mild to moderate muscle soreness later in the day and/or into the next day.  Pt instructed in methods to reduce muscle soreness. Pt instructed to continue prescribed HEP. Patient was educated on signs and symptoms of infection and other risk factors and advised to seek medical attention should they occur.   Patient verbalized understanding of these instructions and education.   Patient Verbal Consent Given: Yes Education Handout Provided: Yes Muscles Treated: Bil upper traps and levator scapulae  Electrical Stimulation Performed: No Treatment Response/Outcome: significant twitch response and palpable improvement in muscle restriction and trigger points; painful response from patient  Manual: Soft tissue mobilization to bil upper traps, cervical paraspinals, levator, and suboccipitals in prone  Exercises: Seated scapular retraction 10x Seated shoulder rolls 10x Seated shoulder shrugs 10x Seated upper trap stretch 2 x 30 seconds bil Seated levator stretch 2 x 20 seconds bil Therapeutic activities: Self massage with tennis ball in pillow case leaning up against wall to help reduce scapular restriction   Eval and HEP Self Care: Additional minutes spent for educating on updated Therapeutic Home Exercise Program as well as comparing current status to condition at start of symptoms. This included exercises focusing  on stretching, strengthening, with focus on eccentric aspects. Long term goals include an improvement in range of motion, strength, endurance as well as avoiding reinjury. Patient's frequency would include in 1-2 times a day, 3-5 times a week for a duration of 6-12 weeks. Proper technique shown and discussed handout in great detail. All questions were discussed and addressed.      PATIENT EDUCATION:  Education details: Discussed eval findings, rehab rationale and POC and patient is in agreement  Person educated: Patient Education method: Explanation and Handouts Education comprehension: verbalized understanding and needs further education  HOME EXERCISE PROGRAM: Access Code: [email or web address] URL: [email or web address] Date: 08/02/2023 Prepared by: Reyes Kohut  Exercises - Seated Shoulder Shrugs  - 2 x daily - 5 x weekly - 2 sets - 10 reps - Seated Scapular Retraction   - 2 x daily - 5 x weekly - 2 sets - 10 reps - Seated Shoulder Horizontal Abduction with Resistance - Palms Down  - 2 x daily - 5 x weekly - 2 sets - 10 reps  ASSESSMENT:  CLINICAL IMPRESSION: Patient is a 53 y.o. female who was seen today for physical therapy evaluation and treatment for Lt upper trap strain. Dry needling performed this session to bil upper traps and levator ani to help decrease trigger points and tissue restriction. Pt had notable twitch response and release; she responded be report of significant pain that went up to 6/10, but she did describe that the pain felt different from her usual discomfort. Soft tissue mobilization also performed to area to help further improve soft tissue restriction and residual soreness from dry needling. We reviewed initial HEP and it was modified to take out band exercise due to wrist pain; we added seated upper trap and levator stretches. She was encouraged to perform self-massage in standing with use of tennis ball for pain management at home. She will continue to benefit from skilled PT intervention in order to decrease cervical pain, address impairments, and improve quality of life.   OBJECTIVE IMPAIRMENTS: decreased activity tolerance, decreased mobility, decreased ROM, increased fascial restrictions, increased muscle spasms, impaired UE functional use, postural dysfunction, and pain.   ACTIVITY LIMITATIONS: carrying, lifting, bending, bed mobility, and reach over head  PERSONAL FACTORS: Age, Fitness, Past/current experiences, and 1 comorbidity: lymphedema are also affecting patient's functional outcome.   REHAB POTENTIAL: Good  CLINICAL DECISION MAKING: Stable/uncomplicated  EVALUATION COMPLEXITY: Low   GOALS: Goals reviewed with patient? No    SHORT TERM GOALS=LONG TERM GOALS: Target date: 10/03/23  Patient to demonstrate independence in HEP  Baseline: [email or web address] Goal status: INITIAL  2.  Patient will acknowledge 4/10 pain at least  once during episode of care   Baseline: 8/10 Goal status: INITIAL  3.  Patient will score at least 20/50 on NDI to signify clinically meaningful improvement in functional abilities.   Baseline: 28/50 Goal status: INITIAL  4.  160d AROM in B shoulder flexion/abduction Baseline: 150d Goal status: INITIAL  5.  75% AROM cervical spine with minimal pain Baseline: 75% with moderate pain Goal status: INITIAL   PLAN:  PT FREQUENCY: 1-2x/week  PT DURATION: 6 weeks  PLANNED INTERVENTIONS: 97110-Therapeutic exercises, 97530- Therapeutic activity, 97112- Neuromuscular re-education, 97535- Self Care, and [id number]- Manual therapy  PLAN FOR NEXT SESSION: HEP review and update, manual techniques as appropriate, aerobic tasks, ROM and flexibility activities, strengthening and PREs, TPDN, gait and balance training as needed     Josette Mares, PT, DPT08/05/254:13 PM

## 2023-08-08 NOTE — Patient Instructions (Signed)

## 2023-08-09 DIAGNOSIS — M25531 Pain in right wrist: Secondary | ICD-10-CM | POA: Diagnosis not present

## 2023-08-10 ENCOUNTER — Ambulatory Visit: Attending: Internal Medicine | Admitting: Occupational Therapy

## 2023-08-10 DIAGNOSIS — I890 Lymphedema, not elsewhere classified: Secondary | ICD-10-CM | POA: Insufficient documentation

## 2023-08-10 NOTE — Therapy (Signed)
 OUTPATIENT OCCUPATIONAL THERAPY TREATMENT NOTE  BLE/ BLQ LYMPHEDEMA  Patient Name: Karen [name] MRN: [id number] DOB:30-Nov-1970, 53 y.o., female Today's Date: 08/10/2023  REPORTING PERIOD:  END OF SESSION:   OT End of Session - 08/10/23 1126     Visit Number 55    Number of Visits 72    Date for OT Re-Evaluation 08/17/23    OT Start Time 1120    OT Stop Time 1209    OT Time Calculation (min) 49 min    Activity Tolerance Patient tolerated treatment well;No increased pain    Behavior During Therapy [facility] for tasks assessed/performed            Past Medical History:  Diagnosis Date   Adenomyosis    Anemia    Arthritis 2013   L knee gets steroid injections    Arthritis, rheumatic, acute or subacute    Asthmatic bronchitis 01/31/2017   Back pain    Chronic constipation    Diarrhea    DVT of lower extremity (deep venous thrombosis) (HCC)    Dysmenorrhea    Endometriosis    Esophageal stricture    G6PD deficiency    GERD (gastroesophageal reflux disease)    History of kidney stones    History of uterine fibroid    IBS (irritable bowel syndrome)    Interstitial cystitis    Lactose intolerance    Migraine    with aura   Other hemochromatosis 06/10/2021   PONV (postoperative nausea and vomiting)    Recurrent upper respiratory infection (URI)    Sebaceous cyst of breast, right lower inner quadrant 10/25/2012   Excised 11/21/12    Sleep apnea    Syncope, non cardiac    Past Surgical History:  Procedure Laterality Date   ARTHROSCOPIC HAGLUNDS REPAIR     BREAST CYST EXCISION Right 11/21/2012   Procedure: CYST EXCISION BREAST;  Surgeon: Sherlean JINNY Laughter, MD;  Location: [facility];  Service: General;  Laterality: Right;   BREAST CYST EXCISION Bilateral 01/18/2022   Procedure: EXCISION OF SEBACEOUS CYST BILATERAL BREAST;  Surgeon: Vanderbilt Ned, MD;  Location: [facility];  Service: General;  Laterality: Bilateral;   BREAST EXCISIONAL  BIOPSY Right 11/2012   CESAREAN SECTION  04/19/1993   CHOLECYSTECTOMY     COLONOSCOPY  05/02/2011   Procedure: COLONOSCOPY;  Surgeon: Lynwood LITTIE Celestia Mickey., MD;  Location: [facility];  Service: Endoscopy;  Laterality: N/A;   colonoscopy  11/04/2014   DENTAL SURGERY  03/06/2018   2 surgeries ( 03/06/2018 and 04/06/2018)   to remove two separate benign tumors   ESOPHAGEAL MANOMETRY N/A 11/04/2015   Procedure: ESOPHAGEAL MANOMETRY (EM);  Surgeon: Lynwood Celestia, MD;  Location: [facility];  Service: Endoscopy;  Laterality: N/A;   ESOPHAGOGASTRODUODENOSCOPY  05/02/2011   Procedure: ESOPHAGOGASTRODUODENOSCOPY (EGD);  Surgeon: Lynwood LITTIE Celestia Mickey., MD;  Location: [facility];  Service: Endoscopy;  Laterality: N/A;   ESOPHAGOGASTRODUODENOSCOPY N/A 09/01/2014   Procedure: ESOPHAGOGASTRODUODENOSCOPY (EGD);  Surgeon: Lynwood Celestia, MD;  Location: [facility];  Service: Endoscopy;  Laterality: N/A;   IR IMAGING GUIDED PORT INSERTION  07/06/2023   KNEE SURGERY     LAPAROSCOPY     x 4   LESION REMOVAL N/A 01/18/2022   Procedure: EXCISION OF SEBACEOUS CYSTS CHEST AND NECK;  Surgeon: Vanderbilt Ned, MD;  Location:  [facility]ERY [facility];  Service: General;  Laterality: N/A;   PH IMPEDANCE STUDY N/A 11/04/2015   Procedure: PH IMPEDANCE STUDY;  Surgeon: Lynwood Celestia, MD;  Location: [facility];  Service: Endoscopy;  Laterality: N/A;   VAGINAL HYSTERECTOMY  2010   TVH--ovaries remain   Patient Active Problem List   Diagnosis Date Noted   Sjogren syndrome with keratoconjunctivitis (HCC) 12/09/2022   Other hemochromatosis 06/10/2021   Elevated LFTs 04/04/2021   Colitis 04/04/2021   Bacterial overgrowth syndrome 05/21/2020   Obesity 05/21/2020   History of endometriosis 05/21/2020   Constipation due to outlet dysfunction 02/05/2020   Gastroesophageal reflux disease 02/05/2020   Obstructive sleep apnea treated with continuous positive airway pressure (CPAP) 06/16/2017   Perennial allergic rhinitis  with a nonallergic component 01/31/2017   Asthmatic bronchitis 01/31/2017   GI symptoms 01/31/2017   Family history of colon cancer 01/27/2015   Chest pain 12/13/2012   Dyspnea 12/13/2012   DVT of lower extremity (deep venous thrombosis) (HCC) 01/03/1990    PCP: Gaile New, MD  REFERRING PROVIDER: Valery Ripple, MD  REFERRING DIAG: I89.0  THERAPY DIAG:  Lymphedema, not elsewhere classified  Rationale for Evaluation and Treatment: Rehabilitation  ONSET DATE: ~2014; exacerbation in 2023  SUBJECTIVE:                                                                                                                                                                                          SUBJECTIVE STATEMENT:  Pt presents for OT to address lower quadrant, including deep abdominal lymphatics, and LLE lymphedema w/ associated pain. Session abbreviated today due to Pt arriving a bit late due to traffic.    PERTINENT HISTORY: CVI, OSA (no CPAP), GERD, Esophageal stricture, IBS, Lactose intolerant, Diarrhea, Hx LE DVT, recent RA dx  PAIN:  Are you having pain? Yes: 4/10 Pain location: LLE, digestive discomfort Pain description: myalgia, tight, heavy, tingling, numbness Aggravating factors: standing, walking, dependent sitting > 30 minutes Relieving factors: elevation  PRECAUTIONS: Other: LYMPHEDEMA; Hx LLE DVT  WEIGHT BEARING RESTRICTIONS: No  FALLS:  Has patient fallen in last 6 months? No  LIVING ENVIRONMENT: Lives with: lives with spouse and daughter Lives in: House/apartment Stairs: Yes; Internal: 14 steps; yes and External: 4 steps; yes Has following equipment at home: None  OCCUPATION: [employer] Services operational officer, walking, desk work  LEISURE: family time  HAND DOMINANCE: right   PRIOR LEVEL OF FUNCTION: Independent  PATIENT GOALS: Feel better, be able to be more active without pain, wear preferred street shoes. NEW 08/03/23: Be able to manage digestive discomfort and  gut inflammation symptoms without medications  OBJECTIVE: Note: Objective measures were completed at Evaluation unless otherwise noted.  COGNITION:  Overall cognitive status: Within functional limits for tasks assessed   POSTURE: WFL  LE ROM: [facility]  LE MMT: WFL  LYMPHEDEMA ASSESSMENTS: non-Ca related  INFECTIONS: denies cellulitis and wound hx  BLE COMPARATIVE LIMB VOLUMETRICS Initial 11/15/22  LANDMARK RIGHT (dominant)  R LEG (A-D) 3028.9 ml  R THIGH (E-G) 4809.60 ml  R FULL LIMB (A-G) 7838.5 ml  Limb Volume differential (LVD)  %  Volume change since initial %  Volume change overall V  (Blank rows = not tested)  LANDMARK LEFT  (Rx)  L LEG (A-D) 3189.9 ml  L THIGH (E-G) 4959.8 ml  L FULL LIMB (A-G) 8149.7 ml  Limb Volume differential (LVD)  LEG LVD = 5.32%, L>R; THIGH LVD = 3.12%, L>R; And full LLE LVD = 3.97%, L>R.   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  Skin  Description Hyper-Keratosis Peau d' Orange Shiny Tight Fibrotic/ Indurated Fatty Doughy Spongy/ boggy       R>L x  x   Skin dry Flaky WNL Macerated   mildly      Color Redness Varicosities Blanching Hemosiderin Stain Mottled        x   Odor Malodorous Yeast Fungal infection  WNL      x   Temperature Warm Cool wnl     x    Pitting Edema   1+ 2+ 3+ 4+ Non-pitting         x   Girth Symmetrical Asymmetrical                   Distribution    L>R toes to groin    Stemmer Sign Positive Negative   +L -R   Lymphorrhea History Of:  Present Absent     x    Wounds History Of Present Absent  Venous Arterial Pressure Sheer     x        Signs of Infection Redness Warmth Erythema Acute Swelling Drainage Borders                    Sensation Light Touch Deep pressure Hypersensitivity   In tact Impaired In tact Impaired Absent Impaired   x  x  x     Nails WNL   Fungus nail dystrophy   x     Hair Growth Symmetrical Asymmetrical   x    Skin Creases Base of toes  Ankles   Base of Fingers knees       Abdominal pannus Thigh Lobules  Face/neck   x           GAIT: Distance walked: >500' Assistive device utilized: None Level of assistance: Complete Independence Comments: Functional ambulation and transfers [facility]  LYMPHEDEMA LIFE IMPACT SCALE (LLIS): Initial 11/15/22  50%  FOTO functional outcome measure: Deferred. FOTO discontinued.  TODAY'S TREATMENT:  MLD to BLQ emphasis on colon and LLE Pt edu for progress towards goals, new goals and LE SELF CARE  PATIENT EDUCATION:  Continued Pt/ CG edu for how function of cisterna chyli, part of the thoracic duct of deep abdominal lymphatics, assists with digestion by filtering many of the fats and proteins from the digestive system. Sub optimal lymphatic flow in this region may result in constipation, bloating, swelling, etc. MLD helps mobilize these protein and fats , and the rhythmic movement of manual lymphatic drainage stimulates digestive muscles and increase peristalsis. Provided printed resource for reference.  Topics include outcome of comparative limb volumetrics- starting limb volume differentials (LVDs), technology and gradient techniques used for short stretch, multilayer compression wrapping, simple self-MLD, therapeutic lymphatic pumping exercises, skin/nail care, LE precautions,. compression garment recommendations and specifications, wear and care schedule and compression garment  donning / doffing w assistive devices. Discussed progress towards all OT goals since commencing CDT. All questions answered to the Pt's satisfaction. Good return. Person educated: Patient  Education method: Explanation, Demonstration, and Handouts Education comprehension: verbalized understanding, returned demonstration, verbal cues required, and needs further education  HOME EXERCISE PROGRAM: BLE lymphatic pumping there ex using- 1 sets of 10 reps, each exercise in order-  1-2 x daily, bilaterally Simple self MLD 1 x daily Daily skin care to increase hydration, skin mobility and decrease infection risk- can be done during MLD Compression wraps 23/7 during intensive phase of CDT Compression garments/ devices during self-management phase of CDT  ASSESSMENT:  CLINICAL IMPRESSION: Pt reports she wore her compression stocking on her LLE for a full day and she noticed obvious positive indentations left by the top band, and had some pain behind the knee when she took them off. We discussed the importance of pulling stocking up to redistribute fabric. This can become uncomfortable as pressure becomes concentrated at narrowest circumference of the leg distally.  thickness that tends to creep down over the course of the day. Repositioning garment will also help with indentations, although they are typical. Pt did not bring stocking to clinic for assessment. Limb volume on the LLE may also be increased, thus causing garment to be too tight at landmarks where compression changes.  Continued MLD to deep abdominal lymphatics combined with diaphragmatic breathing. Finished with stationary circle to descending, transverse and ascending colon. Pt tolerated manual therapy without pain.   Pt continues to benefit from MLD to reduce chronic inflammation and toxins from the intestinal area. Although more research on MLD's effectiveness in treating IBS is needed to determine its efficacy conclusively, preliminary findings  suggest that MLD offers symptomatic relief for some patients. To date, MLD to abdominal lymphatics has helped to stimulate peristalsis and to stimulate the parasympathetic nervous system such that Pt has been able to suspend frequent intestinal cleanses, reports decreased frequency of constipation and more regular bowel movements with regular MLD.Cont  1 x weekly as per POC.  11/15/22 Initial Evaluation: Gari Trovato is a 53 y.o. female presenting with very mild, stage II, LLE lymphedema 2/2 venous insufficiency and obesity. L Leg swelling and associated pain swelling fluctuates. It has progressed over time, and now no longer resolves with elevation over night. RLE swelling limits balance during functional ambulation. It exacerbates infection and falls risk. LLE/RLQ lymphedema interferes with functional performance in all occupational domains, including basic and instrumental ADLs, productive activities, leisure pursuits and social participation. Pt will benefit from Occupational Therapy for Complete Decongestive Therapy (CDT) to restore function, to reduce physical and psychologic  suffering associated with chronic, progressive lymphedema and associated pain, and to limit infection. CDT will include manual lymphatic drainage (MLD), skin care, therapeutic exercise and compression wraps and garment's devices. Without skilled OT for lymphedema care the condition will worsen and further functional decline is expected  Custom-made gradient compression garments and HOS devices are medically necessary because they are uniquely sized and shaped to fit the exact dimensions of the affected extremities, and to provide appropriate medical grade, graduated compression essential for optimally managing chronic, progressive lymphedema. Multiple custom compression garments are needed to ensure proper hygiene to limit infection risk. Custom compression garments should be replaced q 3-6 months When worn consistently for optimal  lipo-lymphedema self-management over time. HOS devices, medically necessary to limit fibrosis buildup in tissue, should be replaced q 2 years and PRN when worn out.      OBJECTIVE IMPAIRMENTS: decreased activity tolerance, decreased knowledge of condition, decreased knowledge of use of DME, increased edema, impaired sensation, pain, and chronic, progressive leg swelling.   ACTIVITY LIMITATIONS: dependent sitting, extended standing and/ or walking, squatting, and lower body dressing, fitting preferred street shoes  PARTICIPATION LIMITATIONS: shopping, community activity, occupation, and yard work  PERSONAL FACTORS: 3+ comorbidities: Hx DVT,  OSA, CVI. Varicose veins are also affecting patient's functional outcome.   REHAB POTENTIAL: Good  EVALUATION COMPLEXITY: Moderate   GOALS: Goals reviewed with patient? Yes  SHORT TERM GOALS: Target date: 4th OT Rx visit  SHORT TERM GOALS: Target date: 4th OT Rx visit   Pt will demonstrate understanding of lymphedema precautions and prevention strategies with modified independence using a printed reference to identify at least 5 precautions and discussing how s/he may implement them into daily life to reduce risk of progression with extra time. Baseline:Max A Goal status: GOAL MET   Pt will be able to apply multilayer, knee length, gradient, compression wraps to one leg at a time with modified assistance (extra time and assistive device/s) to decrease limb volume, to limit infection risk, and to limit lymphedema progression.  Baseline: Dependent Goal status: GOAL DISCHARGED. Pt Going to compression garments instead  LONG TERM GOALS: Target date: 02/08/23  Given this patient's Intake score of 67%/100% on the functional outcomes FOTO tool, patient will experience an increase in function of 5 points to improve basic and instrumental ADLs performance, including lymphedema self-care.  Baseline: Max A Goal status:GOAL DISCHARGED.  FOTO TOOL DISCONTINUED  AT CLINIC   Given this patient's Intake score of 50% on the Lymphedema Life Impact Scale (LLIS), patient will experience a reduction of at least 5 points in her perceived level of functional impairment resulting from lymphedema to improve functional performance and quality of life (QOL). Baseline: 50% Goal status: PROGRESSING  Pt will achieve at least a 10% volume reduction in B legs to return limb to typical size and shape, to limit infection risk and LE progression, to decrease pain, to improve function. Baseline: Dependent Goal status: PROGRESSING  4.  Pt will obtain proper compression garments/devices and achieve modified independence (extra time + assistive devices) with donning/doffing to optimize limb volume reductions and limit LE  progression over time. Baseline:  Goal status:GOAL MET  5.  During Intensive phase CDT , with modified independence, Pt will achieve at least 85% compliance with all lymphedema self-care home program components, including daily skin care, compression wraps and /or garments, simple self MLD and lymphatic pumping therex to habituate LE self care protocol  into ADLs for optimal LE self-management over time. Baseline: Dependent Goal  status:GOAL MET   PLAN:  OT FREQUENCY: 1 /week  OT DURATION: 12 weeks and PRN  PLANNED INTERVENTIONS: 97110-Therapeutic exercises, 97530- Therapeutic activity, 97535- Self Care, [id number]- Manual therapy, Patient/Family education, Manual lymph drainage, Compression bandaging, and fit with appropriate compression garments  PLAN FOR NEXT SESSION:  Cont MLD as established Pt and family edu for LE self-care  Zebedee Dec, MS, OTR/L, CLT-LANA 08/10/23 12:11 PM

## 2023-08-11 ENCOUNTER — Other Ambulatory Visit: Payer: Self-pay

## 2023-08-11 ENCOUNTER — Encounter: Admitting: Occupational Therapy

## 2023-08-14 ENCOUNTER — Telehealth: Payer: Self-pay

## 2023-08-14 ENCOUNTER — Other Ambulatory Visit: Payer: Self-pay | Admitting: Surgery

## 2023-08-14 ENCOUNTER — Ambulatory Visit (HOSPITAL_COMMUNITY)
Admission: RE | Admit: 2023-08-14 | Discharge: 2023-08-14 | Disposition: A | Discharge status: Home / Self Care | Source: Ambulatory Visit | Attending: Surgery | Admitting: Surgery

## 2023-08-14 ENCOUNTER — Other Ambulatory Visit: Payer: Self-pay

## 2023-08-14 ENCOUNTER — Ambulatory Visit: Admitting: Occupational Therapy

## 2023-08-14 ENCOUNTER — Other Ambulatory Visit: Payer: Self-pay | Admitting: Pharmacy Technician

## 2023-08-14 DIAGNOSIS — M79661 Pain in right lower leg: Secondary | ICD-10-CM | POA: Diagnosis not present

## 2023-08-14 DIAGNOSIS — I890 Lymphedema, not elsewhere classified: Secondary | ICD-10-CM

## 2023-08-14 NOTE — Progress Notes (Signed)
 Re-write not needed for this patient. Disregard previous note

## 2023-08-14 NOTE — Progress Notes (Signed)
 PA pending

## 2023-08-14 NOTE — Telephone Encounter (Signed)
 Pharmacy Patient Advocate Encounter  Received notification from [facility] that Prior Authorization for Humira  CF has been APPROVED from 08/14/23 to 08/12/24   PA #/Case ID/Reference #: [id number]

## 2023-08-14 NOTE — Telephone Encounter (Addendum)
 Pt call reporting cramping and pain in her right thigh down to her calf last evening. Pt reports today that her right leg is still sore.  She reports pain is different and more severe that prior episodes from 12/24 and 1/25. Pt has history of DVT   Will schedule DVT scan.

## 2023-08-14 NOTE — Telephone Encounter (Signed)
 Pharmacy Patient Advocate Encounter   Received notification from Patient Pharmacy that prior authorization for Humira  CF is required/requested.   Insurance verification completed.   The patient is insured through [facility] .   Per test claim: PA required; PA submitted to above mentioned insurance via Latent Key/confirmation #/EOC [facility] Status is pending

## 2023-08-14 NOTE — Progress Notes (Signed)
 Specialty Pharmacy Refill Coordination Note  Karen [name] is a 53 y.o. female contacted today regarding refills of specialty medication(s) Adalimumab  (Humira  (2 Pen))   Patient requested Pickup at [facility] at [place] date: 08/14/23   Medication will be filled on 08/14/23.

## 2023-08-14 NOTE — Therapy (Signed)
 OUTPATIENT OCCUPATIONAL THERAPY TREATMENT NOTE  BLE/ BLQ LYMPHEDEMA  Patient Name: Karen [name] MRN: [id number] DOB:1970/10/03, 53 y.o., female Today's Date: 08/14/2023  REPORTING PERIOD:  END OF SESSION:   OT End of Session - 08/14/23 0805     Visit Number 56    Number of Visits 72    Date for OT Re-Evaluation 08/17/23    OT Start Time 0800    OT Stop Time 0900    OT Time Calculation (min) 60 min    Activity Tolerance Patient tolerated treatment well;No increased pain    Behavior During Therapy [facility] for tasks assessed/performed            Past Medical History:  Diagnosis Date   Adenomyosis    Anemia    Arthritis 2013   L knee gets steroid injections    Arthritis, rheumatic, acute or subacute    Asthmatic bronchitis 01/31/2017   Back pain    Chronic constipation    Diarrhea    DVT of lower extremity (deep venous thrombosis) (HCC)    Dysmenorrhea    Endometriosis    Esophageal stricture    G6PD deficiency    GERD (gastroesophageal reflux disease)    History of kidney stones    History of uterine fibroid    IBS (irritable bowel syndrome)    Interstitial cystitis    Lactose intolerance    Migraine    with aura   Other hemochromatosis 06/10/2021   PONV (postoperative nausea and vomiting)    Recurrent upper respiratory infection (URI)    Sebaceous cyst of breast, right lower inner quadrant 10/25/2012   Excised 11/21/12    Sleep apnea    Syncope, non cardiac    Past Surgical History:  Procedure Laterality Date   ARTHROSCOPIC HAGLUNDS REPAIR     BREAST CYST EXCISION Right 11/21/2012   Procedure: CYST EXCISION BREAST;  Surgeon: Sherlean JINNY Laughter, MD;  Location: [facility];  Service: General;  Laterality: Right;   BREAST CYST EXCISION Bilateral 01/18/2022   Procedure: EXCISION OF SEBACEOUS CYST BILATERAL BREAST;  Surgeon: Vanderbilt Ned, MD;  Location: [facility];  Service: General;  Laterality: Bilateral;   BREAST EXCISIONAL  BIOPSY Right 11/2012   CESAREAN SECTION  04/19/1993   CHOLECYSTECTOMY     COLONOSCOPY  05/02/2011   Procedure: COLONOSCOPY;  Surgeon: Lynwood LITTIE Celestia Mickey., MD;  Location: [facility];  Service: Endoscopy;  Laterality: N/A;   colonoscopy  11/04/2014   DENTAL SURGERY  03/06/2018   2 surgeries ( 03/06/2018 and 04/06/2018)   to remove two separate benign tumors   ESOPHAGEAL MANOMETRY N/A 11/04/2015   Procedure: ESOPHAGEAL MANOMETRY (EM);  Surgeon: Lynwood Celestia, MD;  Location: [facility];  Service: Endoscopy;  Laterality: N/A;   ESOPHAGOGASTRODUODENOSCOPY  05/02/2011   Procedure: ESOPHAGOGASTRODUODENOSCOPY (EGD);  Surgeon: Lynwood LITTIE Celestia Mickey., MD;  Location: [facility];  Service: Endoscopy;  Laterality: N/A;   ESOPHAGOGASTRODUODENOSCOPY N/A 09/01/2014   Procedure: ESOPHAGOGASTRODUODENOSCOPY (EGD);  Surgeon: Lynwood Celestia, MD;  Location: [facility];  Service: Endoscopy;  Laterality: N/A;   IR IMAGING GUIDED PORT INSERTION  07/06/2023   KNEE SURGERY     LAPAROSCOPY     x 4   LESION REMOVAL N/A 01/18/2022   Procedure: EXCISION OF SEBACEOUS CYSTS CHEST AND NECK;  Surgeon: Vanderbilt Ned, MD;  Location: [facility];  Service: General;  Laterality: N/A;   PH IMPEDANCE STUDY N/A 11/04/2015   Procedure: PH IMPEDANCE STUDY;  Surgeon: Lynwood Celestia, MD;  Location: [facility];  Service: Endoscopy;  Laterality: N/A;   VAGINAL HYSTERECTOMY  2010   TVH--ovaries remain   Patient Active Problem List   Diagnosis Date Noted   Sjogren syndrome with keratoconjunctivitis (HCC) 12/09/2022   Other hemochromatosis 06/10/2021   Elevated LFTs 04/04/2021   Colitis 04/04/2021   Bacterial overgrowth syndrome 05/21/2020   Obesity 05/21/2020   History of endometriosis 05/21/2020   Constipation due to outlet dysfunction 02/05/2020   Gastroesophageal reflux disease 02/05/2020   Obstructive sleep apnea treated with continuous positive airway pressure (CPAP) 06/16/2017   Perennial allergic rhinitis  with a nonallergic component 01/31/2017   Asthmatic bronchitis 01/31/2017   GI symptoms 01/31/2017   Family history of colon cancer 01/27/2015   Chest pain 12/13/2012   Dyspnea 12/13/2012   DVT of lower extremity (deep venous thrombosis) (HCC) 01/03/1990    PCP: Gaile New, MD  REFERRING PROVIDER: Valery Ripple, MD  REFERRING DIAG: I89.0  THERAPY DIAG:  Lymphedema, not elsewhere classified  Rationale for Evaluation and Treatment: Rehabilitation  ONSET DATE: ~2014; exacerbation in 2023  SUBJECTIVE:                                                                                                                                                                                          SUBJECTIVE STATEMENT:  Pt presents for OT to address lower quadrant, including deep abdominal lymphatics, and LLE lymphedema w/ associated pain. Pt reports she had a cramp in the R quadricept last night while alternating sitting and standing to work on her computer. She rated pain at 8/10 which interfered with her sleep. This morning she rates at 5/10 and describes pain as swelling and point soreness. Encouraged Pt to report symptoms to her rheumatologist and vascular providers since she recently started Humira  for RA.  PERTINENT HISTORY: CVI, OSA (no CPAP), GERD, Esophageal stricture, IBS, Lactose intolerant, Diarrhea, Hx LE DVT, recent RA dx  PAIN:  Are you having pain? Yes: 5/10 Pain location: R thigh (quads); LE, digestive discomfort Pain description: myalgia, tight, heavy, tingling, numbness, soreness Aggravating factors: standing, walking, dependent sitting > 30 minutes Relieving factors: elevation  PRECAUTIONS: Other: LYMPHEDEMA; Hx LLE DVT  WEIGHT BEARING RESTRICTIONS: No  FALLS:  Has patient fallen in last 6 months? No  LIVING ENVIRONMENT: Lives with: lives with spouse and daughter Lives in: House/apartment Stairs: Yes; Internal: 14 steps; yes and External: 4 steps; yes Has  following equipment at home: None  OCCUPATION: [employer] operational officer, walking, desk work  LEISURE: family time  HAND DOMINANCE: right   PRIOR LEVEL OF FUNCTION: Independent  PATIENT GOALS: Feel better, be able to be more active  without pain, wear preferred street shoes. NEW 08/03/23: Be able to manage digestive discomfort and gut inflammation symptoms without medications  OBJECTIVE: Note: Objective measures were completed at Evaluation unless otherwise noted.  COGNITION:  Overall cognitive status: Within functional limits for tasks assessed   POSTURE: WFL  LE ROM: WFL  LE MMT: WFL  LYMPHEDEMA ASSESSMENTS: non-Ca related  INFECTIONS: denies cellulitis and wound hx  BLE COMPARATIVE LIMB VOLUMETRICS Initial 11/15/22  LANDMARK RIGHT (dominant)  R LEG (A-D) 3028.9 ml  R THIGH (E-G) 4809.60 ml  R FULL LIMB (A-G) 7838.5 ml  Limb Volume differential (LVD)  %  Volume change since initial %  Volume change overall V  (Blank rows = not tested)  LANDMARK LEFT  (Rx)  L LEG (A-D) 3189.9 ml  L THIGH (E-G) 4959.8 ml  L FULL LIMB (A-G) 8149.7 ml  Limb Volume differential (LVD)  LEG LVD = 5.32%, L>R; THIGH LVD = 3.12%, L>R; And full LLE LVD = 3.97%, L>R.   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  Skin  Description Hyper-Keratosis Peau d' Orange Shiny Tight Fibrotic/ Indurated Fatty Doughy Spongy/ boggy       R>L x  x   Skin dry Flaky WNL Macerated   mildly      Color Redness Varicosities Blanching Hemosiderin Stain Mottled        x   Odor Malodorous Yeast Fungal infection  WNL       x   Temperature Warm Cool wnl     x    Pitting Edema   1+ 2+ 3+ 4+ Non-pitting         x   Girth Symmetrical Asymmetrical                   Distribution    L>R toes to groin    Stemmer Sign Positive Negative   +L -R   Lymphorrhea History Of:  Present Absent     x    Wounds History Of Present Absent Venous Arterial Pressure Sheer     x        Signs of Infection Redness Warmth Erythema Acute Swelling Drainage Borders                    Sensation Light Touch Deep pressure Hypersensitivity   In tact Impaired In tact Impaired Absent Impaired   x  x  x     Nails WNL   Fungus nail dystrophy   x     Hair Growth Symmetrical Asymmetrical   x    Skin Creases Base of toes  Ankles   Base of Fingers knees       Abdominal pannus Thigh Lobules  Face/neck   x           GAIT: Distance walked: >500' Assistive device utilized: None Level of assistance: Complete Independence Comments: Functional ambulation and transfers [facility]  LYMPHEDEMA LIFE IMPACT SCALE (LLIS): Initial 11/15/22  50%  FOTO functional outcome  measure: Deferred. FOTO discontinued.  TODAY'S TREATMENT:                                                                                                                                         MLD to abdominal lymphatics Pt edu for LE self care Pt edu for trunk rotation stretch in side lying (unwinding)  PATIENT EDUCATION:  Continued Pt/ CG edu for how function of cisterna chyli, part of the thoracic duct of deep abdominal lymphatics, assists with digestion by filtering many of the fats and proteins from the digestive system. Sub optimal lymphatic flow in this region may result in constipation, bloating, swelling, etc. MLD helps mobilize these protein and fats , and the rhythmic movement of manual lymphatic drainage stimulates digestive muscles and increase peristalsis. Provided printed resource for reference.  Topics include outcome of comparative limb  volumetrics- starting limb volume differentials (LVDs), technology and gradient techniques used for short stretch, multilayer compression wrapping, simple self-MLD, therapeutic lymphatic pumping exercises, skin/nail care, LE precautions,. compression garment recommendations and specifications, wear and care schedule and compression garment donning / doffing w assistive devices. Discussed progress towards all OT goals since commencing CDT. All questions answered to the Pt's satisfaction. Good return. Person educated: Patient  Education method: Explanation, Demonstration, and Handouts Education comprehension: verbalized understanding, returned demonstration, verbal cues required, and needs further education  HOME EXERCISE PROGRAM: BLE lymphatic pumping there ex using- 1 sets of 10 reps, each exercise in order-  1-2 x daily, bilaterally Simple self MLD 1 x daily Daily skin care to increase hydration, skin mobility and decrease infection risk- can be done during MLD Compression wraps 23/7 during intensive phase of CDT Compression garments/ devices during self-management phase of CDT  ASSESSMENT:  CLINICAL IMPRESSION: No obvious symptoms of RLE DVT, but Pt will report symptoms to provider. Provided heat pack to R thigh throughout session in effort to reduce muscle soreness and facilitate relaxation for lymphatic stimulation.  Pt tolerated MLD for 45 minutes to deep abdominal lymphatics combined with diaphragmatic breathing in supine and side lying today without increased pain. Provided heat pack to R thigh throughout session in effort to reduce muscle soreness.  Finished with stationary circle to descending, transverse and ascending colon.  Pt continues to benefit from MLD to reduce chronic inflammation and toxins from the intestinal area. Although more research on MLD's effectiveness in treating IBS is needed to determine its efficacy conclusively, preliminary findings suggest that MLD offers symptomatic  relief for some patients. To date, MLD to abdominal lymphatics has helped to stimulate peristalsis and to stimulate the parasympathetic nervous system such that Pt has been able to suspend frequent intestinal cleanses, reports decreased frequency of constipation and more regular bowel movements with regular MLD.Cont  1 x weekly as per POC.  11/15/22 Initial Evaluation: Naiyah Klostermann is a 53 y.o. female presenting with very mild, stage II, LLE lymphedema 2/2  venous insufficiency and obesity. L Leg swelling and associated pain swelling fluctuates. It has progressed over time, and now no longer resolves with elevation over night. RLE swelling limits balance during functional ambulation. It exacerbates infection and falls risk. LLE/RLQ lymphedema interferes with functional performance in all occupational domains, including basic and instrumental ADLs, productive activities, leisure pursuits and social participation. Pt will benefit from Occupational Therapy for Complete Decongestive Therapy (CDT) to restore function, to reduce physical and psychologic suffering associated with chronic, progressive lymphedema and associated pain, and to limit infection. CDT will include manual lymphatic drainage (MLD), skin care, therapeutic exercise and compression wraps and garment's devices. Without skilled OT for lymphedema care the condition will worsen and further functional decline is expected  Custom-made gradient compression garments and HOS devices are medically necessary because they are uniquely sized and shaped to fit the exact dimensions of the affected extremities, and to provide appropriate medical grade, graduated compression essential for optimally managing chronic, progressive lymphedema. Multiple custom compression garments are needed to ensure proper hygiene to limit infection risk. Custom compression garments should be replaced q 3-6 months When worn consistently for optimal lipo-lymphedema self-management over  time. HOS devices, medically necessary to limit fibrosis buildup in tissue, should be replaced q 2 years and PRN when worn out.      OBJECTIVE IMPAIRMENTS: decreased activity tolerance, decreased knowledge of condition, decreased knowledge of use of DME, increased edema, impaired sensation, pain, and chronic, progressive leg swelling.   ACTIVITY LIMITATIONS: dependent sitting, extended standing and/ or walking, squatting, and lower body dressing, fitting preferred street shoes  PARTICIPATION LIMITATIONS: shopping, community activity, occupation, and yard work  PERSONAL FACTORS: 3+ comorbidities: Hx DVT,  OSA, CVI. Varicose veins are also affecting patient's functional outcome.   REHAB POTENTIAL: Good  EVALUATION COMPLEXITY: Moderate   GOALS: Goals reviewed with patient? Yes  SHORT TERM GOALS: Target date: 4th OT Rx visit  SHORT TERM GOALS: Target date: 4th OT Rx visit   Pt will demonstrate understanding of lymphedema precautions and prevention strategies with modified independence using a printed reference to identify at least 5 precautions and discussing how s/he may implement them into daily life to reduce risk of progression with extra time. Baseline:Max A Goal status: GOAL MET   Pt will be able to apply multilayer, knee length, gradient, compression wraps to one leg at a time with modified assistance (extra time and assistive device/s) to decrease limb volume, to limit infection risk, and to limit lymphedema progression.  Baseline: Dependent Goal status: GOAL DISCHARGED. Pt Going to compression garments instead  LONG TERM GOALS: Target date: 02/08/23  Given this patient's Intake score of 67%/100% on the functional outcomes FOTO tool, patient will experience an increase in function of 5 points to improve basic and instrumental ADLs performance, including lymphedema self-care.  Baseline: Max A Goal status:GOAL DISCHARGED.  FOTO TOOL DISCONTINUED AT CLINIC   Given this patient's  Intake score of 50% on the Lymphedema Life Impact Scale (LLIS), patient will experience a reduction of at least 5 points in her perceived level of functional impairment resulting from lymphedema to improve functional performance and quality of life (QOL). Baseline: 50% Goal status: PROGRESSING  Pt will achieve at least a 10% volume reduction in B legs to return limb to typical size and shape, to limit infection risk and LE progression, to decrease pain, to improve function. Baseline: Dependent Goal status: PROGRESSING  4.  Pt will obtain proper compression garments/devices and achieve modified independence (extra time +  assistive devices) with donning/doffing to optimize limb volume reductions and limit LE  progression over time. Baseline:  Goal status:GOAL MET  5.  During Intensive phase CDT , with modified independence, Pt will achieve at least 85% compliance with all lymphedema self-care home program components, including daily skin care, compression wraps and /or garments, simple self MLD and lymphatic pumping therex to habituate LE self care protocol  into ADLs for optimal LE self-management over time. Baseline: Dependent Goal status:GOAL MET   PLAN:  OT FREQUENCY: 1 /week  OT DURATION: 12 weeks and PRN  PLANNED INTERVENTIONS: 97110-Therapeutic exercises, 97530- Therapeutic activity, 97535- Self Care, [id number]- Manual therapy, Patient/Family education, Manual lymph drainage, Compression bandaging, and fit with appropriate compression garments  PLAN FOR NEXT SESSION:  Cont MLD as established Pt and family edu for LE self-care  Zebedee Dec, MS, OTR/L, CLT-LANA 08/14/23 9:26 AM

## 2023-08-15 ENCOUNTER — Ambulatory Visit

## 2023-08-15 DIAGNOSIS — M62838 Other muscle spasm: Secondary | ICD-10-CM

## 2023-08-15 DIAGNOSIS — M5459 Other low back pain: Secondary | ICD-10-CM

## 2023-08-15 DIAGNOSIS — R102 Pelvic and perineal pain: Secondary | ICD-10-CM

## 2023-08-15 DIAGNOSIS — M25531 Pain in right wrist: Secondary | ICD-10-CM | POA: Diagnosis not present

## 2023-08-15 DIAGNOSIS — R279 Unspecified lack of coordination: Secondary | ICD-10-CM

## 2023-08-15 DIAGNOSIS — M6281 Muscle weakness (generalized): Secondary | ICD-10-CM | POA: Diagnosis not present

## 2023-08-15 NOTE — Addendum Note (Signed)
 Encounter addended by: Janice Lynwood BROCKS on: 08/15/2023 9:45 AM  Actions taken: Imaging Exam ended

## 2023-08-15 NOTE — Therapy (Signed)
 OUTPATIENT PHYSICAL THERAPY FEMALE PELVIC TREATMENT   Patient Name: Karen [name] MRN: [id number] DOB:[birth year], 53 y.o., female Today's Date: 08/15/2023  END OF SESSION:  PT End of Session - 08/15/23 1234     Visit Number 47   pfpt   Date for PT Re-Evaluation 10/10/23    Authorization Type [name]    Authorization Time Period signed dry needling consent form 08/08/23    PT Start Time 1233    PT Stop Time 1311    PT Time Calculation (min) 38 min    Activity Tolerance Patient tolerated treatment well    Behavior During Therapy [facility] for tasks assessed/performed                     Past Medical History:  Diagnosis Date   Adenomyosis    Anemia    Arthritis 2013   L knee gets steroid injections    Arthritis, rheumatic, acute or subacute    Asthmatic bronchitis 01/31/2017   Back pain    Chronic constipation    Diarrhea    DVT of lower extremity (deep venous thrombosis) (HCC)    Dysmenorrhea    Endometriosis    Esophageal stricture    G6PD deficiency    GERD (gastroesophageal reflux disease)    History of kidney stones    History of uterine fibroid    IBS (irritable bowel syndrome)    Interstitial cystitis    Lactose intolerance    Migraine    with aura   Other hemochromatosis 06/10/2021   PONV (postoperative nausea and vomiting)    Recurrent upper respiratory infection (URI)    Sebaceous cyst of breast, right lower inner quadrant 10/25/2012   Excised 11/21/12    Sleep apnea    Syncope, non cardiac    Past Surgical History:  Procedure Laterality Date   ARTHROSCOPIC HAGLUNDS REPAIR     BREAST CYST EXCISION Right 11/21/2012   Procedure: CYST EXCISION BREAST;  Surgeon: Sherlean JINNY Laughter, MD;  Location: [facility];  Service: General;  Laterality: Right;   BREAST CYST EXCISION Bilateral 01/18/2022   Procedure: EXCISION OF SEBACEOUS CYST BILATERAL BREAST;  Surgeon: Vanderbilt Ned, MD;  Location: [facility];   Service: General;  Laterality: Bilateral;   BREAST EXCISIONAL BIOPSY Right 11/2012   CESAREAN SECTION  04/19/1993   CHOLECYSTECTOMY     COLONOSCOPY  05/02/2011   Procedure: COLONOSCOPY;  Surgeon: Lynwood LITTIE Celestia Mickey., MD;  Location: [facility];  Service: Endoscopy;  Laterality: N/A;   colonoscopy  11/04/2014   DENTAL SURGERY  03/06/2018   2 surgeries ( 03/06/2018 and 04/06/2018)   to remove two separate benign tumors   ESOPHAGEAL MANOMETRY N/A 11/04/2015   Procedure: ESOPHAGEAL MANOMETRY (EM);  Surgeon: Lynwood Celestia, MD;  Location: [facility];  Service: Endoscopy;  Laterality: N/A;   ESOPHAGOGASTRODUODENOSCOPY  05/02/2011   Procedure: ESOPHAGOGASTRODUODENOSCOPY (EGD);  Surgeon: Lynwood LITTIE Celestia Mickey., MD;  Location: [facility];  Service: Endoscopy;  Laterality: N/A;   ESOPHAGOGASTRODUODENOSCOPY N/A 09/01/2014   Procedure: ESOPHAGOGASTRODUODENOSCOPY (EGD);  Surgeon: Lynwood Celestia, MD;  Location: [facility];  Service: Endoscopy;  Laterality: N/A;   IR IMAGING GUIDED PORT INSERTION  07/06/2023   KNEE SURGERY     LAPAROSCOPY     x 4   LESION REMOVAL N/A 01/18/2022   Procedure: EXCISION OF SEBACEOUS CYSTS CHEST AND NECK;  Surgeon: Vanderbilt Ned, MD;  Location: [facility];  Service: General;  Laterality: N/A;   PH  IMPEDANCE STUDY N/A 11/04/2015   Procedure: PH IMPEDANCE STUDY;  Surgeon: Lynwood Bohr, MD;  Location: [facility];  Service: Endoscopy;  Laterality: N/A;   VAGINAL HYSTERECTOMY  2010   TVH--ovaries remain   Patient Active Problem List   Diagnosis Date Noted   Sjogren syndrome with keratoconjunctivitis (HCC) 12/09/2022   Other hemochromatosis 06/10/2021   Elevated LFTs 04/04/2021   Colitis 04/04/2021   Bacterial overgrowth syndrome 05/21/2020   Obesity 05/21/2020   History of endometriosis 05/21/2020   Constipation due to outlet dysfunction 02/05/2020   Gastroesophageal reflux disease 02/05/2020   Obstructive sleep apnea treated with continuous positive  airway pressure (CPAP) 06/16/2017   Perennial allergic rhinitis with a nonallergic component 01/31/2017   Asthmatic bronchitis 01/31/2017   GI symptoms 01/31/2017   Family history of colon cancer 01/27/2015   Chest pain 12/13/2012   Dyspnea 12/13/2012   DVT of lower extremity (deep venous thrombosis) (HCC) 01/03/1990    PCP: Elliot Charm, MD  REFERRING PROVIDER: Cleotilde Ronal RAMAN, MD   REFERRING DIAG: M62.89 (ICD-10-CM) - Pelvic floor dysfunction  THERAPY DIAG:  Other muscle spasm  Muscle weakness (generalized)  Unspecified lack of coordination  Other low back pain  Pelvic pain  Rationale for Evaluation and Treatment: Rehabilitation  ONSET DATE: chronic  SUBJECTIVE:                                                                                                                                                                                           SUBJECTIVE STATEMENT:  Pt states that she is having more regular bowel movements. She went for a long walk on Sunday and then had severe cramping in her Rt thigh that night that lasted all night.   PAIN: 08/15/23 Are you having pain? Yes NPRS scale: 4/10 Pain location: Lower abdominal pain, Lt sided pain, low back pain  Pain type: turning, knotted, pressure Pain description: intermittent and constant   Aggravating factors: constipation or frequent bowel movements/constantly having bowel movements  Relieving factors: exercises (bowel massage, happy baby, spinal twists, walking)  PRECAUTIONS: None  WEIGHT BEARING RESTRICTIONS: No  FALLS:  Has patient fallen in last 6 months? No  LIVING ENVIRONMENT: Lives with: lives with their spouse Lives in: House/apartment  OCCUPATION: nurse, in admin  PLOF: Independent  PATIENT GOALS: decrease pain and have more normal bowel movements  PERTINENT HISTORY:  Vaginal hysterectomy, DVT LE, endometriosis, interstitial cystitis, exploratory laps for endometriosis,  c-section, cholecystectomy, RA diagnosed 06/2023 Sexual abuse: No  BOWEL MOVEMENT: Pain with bowel movement: Yes Type of bowel movement:Type (Bristol Stool Scale) 1-7 (sometimes full range in the same bowel movement), Frequency sometimes  many times a day, sometimes up to 4 days in between, and Strain Yes Fully empty rectum: No Leakage: No Pads: No Fiber supplement: No - has attempted metamucil and it made constipation worse  URINATION: Pain with urination: Yes Fully empty bladder: No Stream: varies  Urgency: Yes: all the time Frequency: multiple times an hours  Leakage: Urge to void, Walking to the bathroom, Coughing, Sneezing, Laughing, and Exercise Pads: Yes: daily  INTERCOURSE: Pain with intercourse: Initial Penetration and During Penetration Ability to have vaginal penetration:  Yes: with pain Climax: non painful WNL   PREGNANCY: Vaginal deliveries 1 Tearing Yes: 3rd degree tear C-section deliveries 1 Currently pregnant No  PROLAPSE: Periodically will feel vaginal bulging, heaviness in lower abdomen   OBJECTIVE:  05/04/23:               Internal Pelvic Floor: mild tenderness in bil levator ani, but no increase in tension; with internal rectal exam, she was demonstrating paradoxical contraction when trying to eccentrically lengthen with exhale and had very hard time correcting  Patient confirms identification and approves PT to assess internal pelvic floor and treatment Yes  PELVIC MMT:   MMT eval  Vaginal 2/5, 3 second hold, 3 repeat contractions  Internal Anal Sphincter 1/5  External Anal Sphincter 2/5  Puborectalis 1/5  Diastasis Recti   (Blank rows = not tested)        TONE: WNL   PROLAPSE: Grade 2 anterior vaginal wall laxity  04/25/23: Curl-up test: abdominal distortion Transversus abdominus: week, difficulty getting active contraction (not been working on strengthening)    LUMBARAROM/PROM:  A/PROM A/PROM  Eval (% available) 11/09/22 (% available)  04/25/23 (% available)  Flexion 50 90 90  Extension 25, pain anteriorly  25, pain in low back 50  Right lateral flexion 50, pain anteriorly  75, pain on right 50, pain on Rt  Left lateral flexion 50, pain anteriorly  75, pain on right 75   (Blank rows = not tested)  11/09/22: No internal rectal or vaginal pelvic floor exam performed due to high level of skin irritation   Weak transversus abdominus contraction  Abdominal restriction and tenderness in bil lower quadrant  Unable to perform pelvic tilt with appropriate coordination  LUMBARAROM/PROM:  A/PROM A/PROM  Eval (% available) 11/09/22 (% available)  Flexion 50 90  Extension 25, pain anteriorly  25, pain in low back  Right lateral flexion 50, pain anteriorly  75, pain on right  Left lateral flexion 50, pain anteriorly  75, pain on right   (Blank rows = not tested) 07/21/22: standing prolapse assessment demonstrated grade 2 anterior vaginal wall laxity   06/08/22:               External Perineal Exam: WNL                              Internal Pelvic Floor: burning reported with palpation of superficial muscles; deep aching/pressure with palpation of Lt levator ani   Patient confirms identification and approves PT to assess internal pelvic floor and treatment Yes  PELVIC MMT:   MMT eval  Vaginal 3/5  Internal Anal Sphincter 1/5  External Anal Sphincter 2/5  Puborectalis 1/5  Diastasis Recti   (Blank rows = not tested)        TONE: High in Lt levator ani   PROLAPSE: Not able to tell this session due to dyssynergic pelvic floor contraction  06/01/22: COGNITION: Overall cognitive status: Within functional limits for tasks assessed     SENSATION: Light touch: Appears intact Proprioception: Appears intact  GAIT: Comments: decreased hip extension, forward flexed trunk  POSTURE: rounded shoulders, forward head, decreased lumbar lordosis, increased thoracic kyphosis, posterior pelvic tilt, and flexed trunk     LUMBARAROM/PROM:  A/PROM A/PROM  Eval (% available)  Flexion 50  Extension 25, pain anteriorly   Right lateral flexion 50, pain anteriorly   Left lateral flexion 50, pain anteriorly    (Blank rows = not tested)   PALPATION:   General  Significant abdominal restriction and tenderness; decreased rib mobility with mobilization and breathing    TODAY'S TREATMENT:                                                                                                                              DATE:  08/15/23 Manual: ILU bowel mobilization  Abdominal scar tissue mobilization Ileocecal valve mobilization External rectum mobilization Exercises: Open books 10x bil Clam shells 10x bil Supine bridge + hip adduction 2 x 10 Supine bridge + hip abduction red band 2 x 10 Supine resisted march red band 2 x 10   08/07/23 Manual: ILU bowel mobilization  Abdominal scar tissue mobilization Ileocecal valve mobilization External rectum mobilization Exercises: Supine bridge with hip adduction 2 x 10 Supine hip abduction red band 2 x 10 Supine piriformis stretch 60 sec bil Supine resisted march red band 2 x 10 Butterfly stretch 60 sec Lower trunk rotation 2 x 10 Single knee to chest 5x bil   08/01/23 Manual: ILU bowel mobilization  Abdominal scar tissue mobilization Ileocecal valve mobilization External rectum mobilization Exercises: Supine bridge with hip adduction 2 x 10 Supine hip abduction red band 2 x 10 Supine piriformis stretch 60 sec bil Supine resisted march red band 2 x 10 Butterfly stretch 60 sec Lower trunk rotation 2 x 10 Single knee to chest 5x bil   PATIENT EDUCATION:  Education details: See above Person educated: Patient Education method: Explanation, Demonstration, Tactile cues, Verbal cues, and Handouts Education comprehension: verbalized understanding  HOME EXERCISE PROGRAM: [id number]  ASSESSMENT:  CLINICAL IMPRESSION: Pt doing well with improved  bowel movements. Believe this is a combination of regular manual techniques and also being more active. We continued to perform exercises and progressed hip strengthening and mobility today. Good tolerance to all manual techniques with improved gut sounds. She will continue to benefit from skilled PT intervention in order to decrease pain, improve bowel movements, and decrease urinary urgency/incontinence.     OBJECTIVE IMPAIRMENTS: decreased activity tolerance, decreased coordination, decreased endurance, decreased mobility, decreased strength, increased fascial restrictions, increased muscle spasms, impaired tone, postural dysfunction, and pain.   ACTIVITY LIMITATIONS: lifting, bending, continence, and locomotion level  PARTICIPATION LIMITATIONS: interpersonal relationship, community activity, and occupation  PERSONAL FACTORS: 1 comorbidity: medical history are also affecting patient's functional outcome.   REHAB POTENTIAL: Good  CLINICAL DECISION MAKING: Stable/uncomplicated  EVALUATION  COMPLEXITY: Low   GOALS: Goals reviewed with patient? Yes  SHORT TERM GOALS: Updated 08/01/23  Pt will be independent with HEP.   Baseline: Goal status: MET 07/12/22  2.  Pt will be independent with diaphragmatic breathing and down training activities in order to improve pelvic floor relaxation.  Baseline:  Goal status: MET 07/12/22  3.  Pt will be independent with the knack, urge suppression technique, and double voiding in order to improve bladder habits and decrease urinary incontinence.   Baseline:  Goal status: MET 09/22/22  4.  Pt will be independent with use of squatty potty, relaxed toileting mechanics, and improved bowel movement techniques in order to increase ease of bowel movements and complete evacuation.   Baseline:  Goal status: MET 11/09/22  5.  Pt will be able to correctly perform diaphragmatic breathing and appropriate pressure management in order to prevent worsening vaginal  wall laxity and improve pelvic floor A/ROM.   Baseline:  Goal status: MET 01/18/23  LONG TERM GOALS: Updated 08/01/23  Pt will be independent with advanced HEP.   Baseline:  Goal status: IN PROGRESS 08/01/23  2.  Pt will demonstrate normal pelvic floor muscle tone and A/ROM, able to achieve 4/5 strength with contractions and 10 sec endurance, in order to provide appropriate lumbopelvic support in functional activities.   Baseline: not assessed today per patient request  Goal status: IN PROGRESS 08/01/23  3.  Pt will increase all impaired lumbar A/ROM by 25% without pain.  Baseline: Pt seen a little bit of loss in some and improvement in extension Goal status:  IN PROGRESS 08/01/23  4.  Pt will report pain no higher than 3/10 with any activity. Baseline: pain has gotten up to 5/10, but currently at a 3/10 Goal status:  IN PROGRESS 08/01/23  5.  Pt will report 0/10 pain with vaginal penetration in order to improve intimate relationship with partner.    Baseline: no recent intercourse attempts  Goal status:  IN PROGRESS 08/01/23  6.  Pt will be able to go 2-3 hours in between voids without urgency or incontinence in order to improve QOL and perform all functional activities with less difficulty.   Baseline:  Goal status: MET 02/21/23  7.  Pt will report no leaks with laughing, coughing, sneezing in order to improve comfort with interpersonal relationships and community activities.   Baseline: Pt states that she feels 30-40% better Goal status:  IN PROGRESS 08/01/23  8.  Pt will have consistent bowel movements 4-5x/week without straining or pain.  Baseline:  Goal status:  MET 02/21/23  PLAN:  PT FREQUENCY: 1-2x/week  PT DURATION: 6 months  PLANNED INTERVENTIONS: Therapeutic exercises, Therapeutic activity, Neuromuscular re-education, Balance training, Gait training, Patient/Family education, Self Care, Joint mobilization, Dry Needling, Biofeedback, and Manual therapy  PLAN FOR  NEXT SESSION: strengthening program - reinforce importance and go over some exercises each session; internal pelvic floor muscle exam  Josette Mares, PT, DPT08/12/251:13 PM

## 2023-08-15 NOTE — Addendum Note (Signed)
 Encounter addended by: Janice Lynwood BROCKS on: 08/15/2023 10:00 AM  Actions taken: Imaging Exam ended

## 2023-08-16 ENCOUNTER — Encounter: Payer: Self-pay | Admitting: Physical Therapy

## 2023-08-16 ENCOUNTER — Ambulatory Visit: Admitting: Physical Therapy

## 2023-08-16 ENCOUNTER — Encounter: Admitting: Physical Therapy

## 2023-08-16 DIAGNOSIS — M542 Cervicalgia: Secondary | ICD-10-CM | POA: Diagnosis not present

## 2023-08-16 DIAGNOSIS — M6281 Muscle weakness (generalized): Secondary | ICD-10-CM

## 2023-08-16 DIAGNOSIS — M62838 Other muscle spasm: Secondary | ICD-10-CM

## 2023-08-16 DIAGNOSIS — S46812A Strain of other muscles, fascia and tendons at shoulder and upper arm level, left arm, initial encounter: Secondary | ICD-10-CM

## 2023-08-16 DIAGNOSIS — S29012A Strain of muscle and tendon of back wall of thorax, initial encounter: Secondary | ICD-10-CM | POA: Diagnosis not present

## 2023-08-16 NOTE — Therapy (Signed)
 OUTPATIENT PHYSICAL THERAPY CERVICAL TREATMENT   Patient Name: Karen [name] MRN: [id number] DOB:08/01/1970, 53 y.o., female Today's Date: 08/16/2023  END OF SESSION:  PT End of Session - 08/16/23 0857     Visit Number 3   ortho count (3)   Date for PT Re-Evaluation 10/03/23    Authorization Type [name]    Authorization Time Period signed dry needling consent form 08/08/23    PT Start Time 0803    PT Stop Time 0844    PT Time Calculation (min) 41 min    Activity Tolerance Patient tolerated treatment well    Behavior During Therapy [facility] for tasks assessed/performed            Past Medical History:  Diagnosis Date   Adenomyosis    Anemia    Arthritis 2013   L knee gets steroid injections    Arthritis, rheumatic, acute or subacute    Asthmatic bronchitis 01/31/2017   Back pain    Chronic constipation    Diarrhea    DVT of lower extremity (deep venous thrombosis) (HCC)    Dysmenorrhea    Endometriosis    Esophageal stricture    G6PD deficiency    GERD (gastroesophageal reflux disease)    History of kidney stones    History of uterine fibroid    IBS (irritable bowel syndrome)    Interstitial cystitis    Lactose intolerance    Migraine    with aura   Other hemochromatosis 06/10/2021   PONV (postoperative nausea and vomiting)    Recurrent upper respiratory infection (URI)    Sebaceous cyst of breast, right lower inner quadrant 10/25/2012   Excised 11/21/12    Sleep apnea    Syncope, non cardiac    Past Surgical History:  Procedure Laterality Date   ARTHROSCOPIC HAGLUNDS REPAIR     BREAST CYST EXCISION Right 11/21/2012   Procedure: CYST EXCISION BREAST;  Surgeon: Sherlean JINNY Laughter, MD;  Location: [facility];  Service: General;  Laterality: Right;   BREAST CYST EXCISION Bilateral 01/18/2022   Procedure: EXCISION OF SEBACEOUS CYST BILATERAL BREAST;  Surgeon: Vanderbilt Ned, MD;  Location: [facility];  Service: General;   Laterality: Bilateral;   BREAST EXCISIONAL BIOPSY Right 11/2012   CESAREAN SECTION  04/19/1993   CHOLECYSTECTOMY     COLONOSCOPY  05/02/2011   Procedure: COLONOSCOPY;  Surgeon: Lynwood LITTIE Celestia Mickey., MD;  Location: [facility];  Service: Endoscopy;  Laterality: N/A;   colonoscopy  11/04/2014   DENTAL SURGERY  03/06/2018   2 surgeries ( 03/06/2018 and 04/06/2018)   to remove two separate benign tumors   ESOPHAGEAL MANOMETRY N/A 11/04/2015   Procedure: ESOPHAGEAL MANOMETRY (EM);  Surgeon: Lynwood Celestia, MD;  Location: [facility];  Service: Endoscopy;  Laterality: N/A;   ESOPHAGOGASTRODUODENOSCOPY  05/02/2011   Procedure: ESOPHAGOGASTRODUODENOSCOPY (EGD);  Surgeon: Lynwood LITTIE Celestia Mickey., MD;  Location: [facility];  Service: Endoscopy;  Laterality: N/A;   ESOPHAGOGASTRODUODENOSCOPY N/A 09/01/2014   Procedure: ESOPHAGOGASTRODUODENOSCOPY (EGD);  Surgeon: Lynwood Celestia, MD;  Location: [facility];  Service: Endoscopy;  Laterality: N/A;   IR IMAGING GUIDED PORT INSERTION  07/06/2023   KNEE SURGERY     LAPAROSCOPY     x 4   LESION REMOVAL N/A 01/18/2022   Procedure: EXCISION OF SEBACEOUS CYSTS CHEST AND NECK;  Surgeon: Vanderbilt Ned, MD;  Location: [facility];  Service: General;  Laterality: N/A;   PH IMPEDANCE STUDY N/A 11/04/2015   Procedure: PH  IMPEDANCE STUDY;  Surgeon: Lynwood Bohr, MD;  Location: [facility];  Service: Endoscopy;  Laterality: N/A;   VAGINAL HYSTERECTOMY  2010   TVH--ovaries remain   Patient Active Problem List   Diagnosis Date Noted   Sjogren syndrome with keratoconjunctivitis (HCC) 12/09/2022   Other hemochromatosis 06/10/2021   Elevated LFTs 04/04/2021   Colitis 04/04/2021   Bacterial overgrowth syndrome 05/21/2020   Obesity 05/21/2020   History of endometriosis 05/21/2020   Constipation due to outlet dysfunction 02/05/2020   Gastroesophageal reflux disease 02/05/2020   Obstructive sleep apnea treated with continuous positive airway pressure (CPAP)  06/16/2017   Perennial allergic rhinitis with a nonallergic component 01/31/2017   Asthmatic bronchitis 01/31/2017   GI symptoms 01/31/2017   Family history of colon cancer 01/27/2015   Chest pain 12/13/2012   Dyspnea 12/13/2012   DVT of lower extremity (deep venous thrombosis) (HCC) 01/03/1990    PCP: Elliot Charm, MD   REFERRING PROVIDER: Huey Redell Fallow, PA-C  REFERRING DIAG: M54.2 (ICD-10-CM) - Cervicalgia  THERAPY DIAG:  Other muscle spasm  Muscle weakness (generalized)  Cervicalgia  Strain of left trapezius muscle, initial encounter  Rationale for Evaluation and Treatment: Rehabilitation  ONSET DATE: July 2025  SUBJECTIVE:                                                                                                                                                                                                         SUBJECTIVE STATEMENT: Patient reports she is very sore and stiff today. Pain is 5/10. HEP exercises has been going okay.  Eval: Reports onset of L UT pain following port placement into R shoulder Hand dominance: Right  PERTINENT HISTORY:  07/21/2023 Assessment: - suspect left-sided cervical/ trapezius muscle spasm - reassured by absence of red flag signs/ symptoms at this time   Concurrent lymphedema PAIN: 08/08/23 Are you having pain? Yes: NPRS scale: 5/10 Pain location: L UT Pain description: ache, cramp Aggravating factors: activity Relieving factors: heat, medication  PRECAUTIONS: None  RED FLAGS: None     WEIGHT BEARING RESTRICTIONS: No  FALLS:  Has patient fallen in last 6 months? No  OCCUPATION: [employer] Employee  PLOF: Independent  PATIENT GOALS: To manage my neck pain  NEXT MD VISIT: TBD  OBJECTIVE:  Note: Objective measures were completed at Evaluation unless otherwise noted.  DIAGNOSTIC FINDINGS:  none  PATIENT SURVEYS:  NDI: 28/50 56% perceived disability  Minimum Detectable Change (90%  confidence): 5 points or 10% points   POSTURE: rounded shoulders and forward head  PALPATION: Global tenderness throughout B  UT and posterior shoulder girdle   CERVICAL ROM:   Active ROM A/PROM (deg) eval  Flexion 75%P!  Extension 75%P!  Right lateral flexion 75%P!  Left lateral flexion 75%P!  Right rotation 75%P!  Left rotation 75%P!   (Blank rows = not tested)  UPPER EXTREMITY ROM:  Active ROM Right eval Left eval  Shoulder flexion 150 150  Shoulder extension    Shoulder abduction 150 150  Shoulder adduction    Shoulder extension    Shoulder internal rotation    Shoulder external rotation    Elbow flexion    Elbow extension    Wrist flexion    Wrist extension    Wrist ulnar deviation    Wrist radial deviation    Wrist pronation    Wrist supination     (Blank rows = not tested)  UPPER EXTREMITY MMT:  MMT Right eval Left eval  Shoulder flexion    Shoulder extension    Shoulder abduction    Shoulder adduction    Shoulder extension    Shoulder internal rotation    Shoulder external rotation    Middle trapezius    Lower trapezius    Elbow flexion    Elbow extension    Wrist flexion    Wrist extension    Wrist ulnar deviation    Wrist radial deviation    Wrist pronation    Wrist supination    Grip strength     (Blank rows = not tested)  CERVICAL SPECIAL TESTS:  Neck flexor muscle endurance test: Negative  FUNCTIONAL TESTS:  N/A  TREATMENT                                                                                                                       DATE: 08/16/2023 Updated on status and Pain Seated scap squeeze x 12 Seated upper trap stretch 2 x 30 seconds bil (verbal cues for correct performance) Seated levator stretch 2 x 20 seconds bil Seated chin tucks 2 x 10 Standing shoulder row & extension  with red TB 2 x 10 Standing shoulder abduction with red TB 2 x 10 Open Books at wall x 5 each direction Blue stability ball stretch  (forwards) x 8 Manual: STM to bilateral upper trap and cervical paraspinals for decreased pain and improved muscle length   08/08/23 Trigger Point Dry Needling  Initial Treatment: Pt instructed on Dry Needling rational, procedures, and possible side effects. Pt instructed to expect mild to moderate muscle soreness later in the day and/or into the next day.  Pt instructed in methods to reduce muscle soreness. Pt instructed to continue prescribed HEP. Patient was educated on signs and symptoms of infection and other risk factors and advised to seek medical attention should they occur.  Patient verbalized understanding of these instructions and education.   Patient Verbal Consent Given: Yes Education Handout Provided: Yes Muscles Treated: Bil upper traps and levator scapulae  Electrical Stimulation Performed: No Treatment Response/Outcome: significant twitch response and palpable  improvement in muscle restriction and trigger points; painful response from patient  Manual: Soft tissue mobilization to bil upper traps, cervical paraspinals, levator, and suboccipitals in prone  Exercises: Seated scapular retraction 10x Seated shoulder rolls 10x Seated shoulder shrugs 10x Seated upper trap stretch 2 x 30 seconds bil Seated levator stretch 2 x 20 seconds bil Therapeutic activities: Self massage with tennis ball in pillow case leaning up against wall to help reduce scapular restriction   Eval and HEP Self Care: Additional minutes spent for educating on updated Therapeutic Home Exercise Program as well as comparing current status to condition at start of symptoms. This included exercises focusing on stretching, strengthening, with focus on eccentric aspects. Long term goals include an improvement in range of motion, strength, endurance as well as avoiding reinjury. Patient's frequency would include in 1-2 times a day, 3-5 times a week for a duration of 6-12 weeks. Proper technique shown and  discussed handout in great detail. All questions were discussed and addressed.      PATIENT EDUCATION:  Education details: Discussed eval findings, rehab rationale and POC and patient is in agreement  Person educated: Patient Education method: Explanation and Handouts Education comprehension: verbalized understanding and needs further education  HOME EXERCISE PROGRAM: Access Code: [email or web address] URL: [email or web address] Date: 08/02/2023 Prepared by: Reyes Kohut  Exercises - Seated Shoulder Shrugs  - 2 x daily - 5 x weekly - 2 sets - 10 reps - Seated Scapular Retraction  - 2 x daily - 5 x weekly - 2 sets - 10 reps - Seated Shoulder Horizontal Abduction with Resistance - Palms Down  - 2 x daily - 5 x weekly - 2 sets - 10 reps  ASSESSMENT:  CLINICAL IMPRESSION: Karen [name] presents to skilled therapy verbalizing 5/10 pain. She has been compliant with HEP. She required minimal verbal cues for form correction while performing exercises. Progressed patient's periscapular strengthening exercises today and patient tolerated them well. Thoracic mobility was slightly limited due to increased pain/ discomfort when performing exercise. Patient responded well to manual soft tissue techniques and verbalized decreased pain. Patient will benefit from skilled PT to address the below impairments and improve overall function.   OBJECTIVE IMPAIRMENTS: decreased activity tolerance, decreased mobility, decreased ROM, increased fascial restrictions, increased muscle spasms, impaired UE functional use, postural dysfunction, and pain.   ACTIVITY LIMITATIONS: carrying, lifting, bending, bed mobility, and reach over head  PERSONAL FACTORS: Age, Fitness, Past/current experiences, and 1 comorbidity: lymphedema are also affecting patient's functional outcome.   REHAB POTENTIAL: Good  CLINICAL DECISION MAKING: Stable/uncomplicated  EVALUATION COMPLEXITY: Low   GOALS: Goals reviewed with patient?  No    SHORT TERM GOALS=LONG TERM GOALS: Target date: 10/03/23  Patient to demonstrate independence in HEP  Baseline: [id number] Goal status: INITIAL  2.  Patient will acknowledge 4/10 pain at least once during episode of care   Baseline: 8/10 Goal status: INITIAL  3.  Patient will score at least 20/50 on NDI to signify clinically meaningful improvement in functional abilities.   Baseline: 28/50 Goal status: INITIAL  4.  160d AROM in B shoulder flexion/abduction Baseline: 150d Goal status: INITIAL  5.  75% AROM cervical spine with minimal pain Baseline: 75% with moderate pain Goal status: INITIAL   PLAN:  PT FREQUENCY: 1-2x/week  PT DURATION: 6 weeks  PLANNED INTERVENTIONS: 97110-Therapeutic exercises, 97530- Therapeutic activity, 97112- Neuromuscular re-education, 97535- Self Care, and [id number]- Manual therapy  PLAN FOR NEXT SESSION:assess response to treatment session; update HEP; manual techniques as appropriate, aerobic tasks,  ROM and flexibility activities, strengthening and PREs, TPDN, gait and balance training as needed     Kristeen Sar, PT 08/16/23 8:58 AM

## 2023-08-18 ENCOUNTER — Ambulatory Visit: Admitting: Occupational Therapy

## 2023-08-18 ENCOUNTER — Encounter: Payer: Self-pay | Admitting: Occupational Therapy

## 2023-08-18 DIAGNOSIS — I890 Lymphedema, not elsewhere classified: Secondary | ICD-10-CM

## 2023-08-18 NOTE — Therapy (Signed)
 OUTPATIENT OCCUPATIONAL THERAPY TREATMENT NOTE  BLE/ BLQ LYMPHEDEMA  Patient Name: Karen [name] MRN: [id number] DOB:1970/05/09, 53 y.o., female Today's Date: 08/18/2023  REPORTING PERIOD:  END OF SESSION:   OT End of Session - 08/18/23 1217     Visit Number 57    Number of Visits 72    Date for OT Re-Evaluation 11/16/23    OT Start Time 0900    OT Stop Time 1005    OT Time Calculation (min) 65 min    Activity Tolerance Patient tolerated treatment well;No increased pain    Behavior During Therapy [facility] for tasks assessed/performed            Past Medical History:  Diagnosis Date   Adenomyosis    Anemia    Arthritis 2013   L knee gets steroid injections    Arthritis, rheumatic, acute or subacute    Asthmatic bronchitis 01/31/2017   Back pain    Chronic constipation    Diarrhea    DVT of lower extremity (deep venous thrombosis) (HCC)    Dysmenorrhea    Endometriosis    Esophageal stricture    G6PD deficiency    GERD (gastroesophageal reflux disease)    History of kidney stones    History of uterine fibroid    IBS (irritable bowel syndrome)    Interstitial cystitis    Lactose intolerance    Migraine    with aura   Other hemochromatosis 06/10/2021   PONV (postoperative nausea and vomiting)    Recurrent upper respiratory infection (URI)    Sebaceous cyst of breast, right lower inner quadrant 10/25/2012   Excised 11/21/12    Sleep apnea    Syncope, non cardiac    Past Surgical History:  Procedure Laterality Date   ARTHROSCOPIC HAGLUNDS REPAIR     BREAST CYST EXCISION Right 11/21/2012   Procedure: CYST EXCISION BREAST;  Surgeon: Sherlean JINNY Laughter, MD;  Location: [facility];  Service: General;  Laterality: Right;   BREAST CYST EXCISION Bilateral 01/18/2022   Procedure: EXCISION OF SEBACEOUS CYST BILATERAL BREAST;  Surgeon: Vanderbilt Ned, MD;  Location: [facility];  Service: General;  Laterality: Bilateral;   BREAST EXCISIONAL  BIOPSY Right 11/2012   CESAREAN SECTION  04/19/1993   CHOLECYSTECTOMY     COLONOSCOPY  05/02/2011   Procedure: COLONOSCOPY;  Surgeon: Lynwood LITTIE Celestia Mickey., MD;  Location: [facility];  Service: Endoscopy;  Laterality: N/A;   colonoscopy  11/04/2014   DENTAL SURGERY  03/06/2018   2 surgeries ( 03/06/2018 and 04/06/2018)   to remove two separate benign tumors   ESOPHAGEAL MANOMETRY N/A 11/04/2015   Procedure: ESOPHAGEAL MANOMETRY (EM);  Surgeon: Lynwood Celestia, MD;  Location: [facility];  Service: Endoscopy;  Laterality: N/A;   ESOPHAGOGASTRODUODENOSCOPY  05/02/2011   Procedure: ESOPHAGOGASTRODUODENOSCOPY (EGD);  Surgeon: Lynwood LITTIE Celestia Mickey., MD;  Location: [facility];  Service: Endoscopy;  Laterality: N/A;   ESOPHAGOGASTRODUODENOSCOPY N/A 09/01/2014   Procedure: ESOPHAGOGASTRODUODENOSCOPY (EGD);  Surgeon: Lynwood Celestia, MD;  Location: [facility];  Service: Endoscopy;  Laterality: N/A;   IR IMAGING GUIDED PORT INSERTION  07/06/2023   KNEE SURGERY     LAPAROSCOPY     x 4   LESION REMOVAL N/A 01/18/2022   Procedure: EXCISION OF SEBACEOUS CYSTS CHEST AND NECK;  Surgeon: Vanderbilt Ned, MD;  Location: [facility];  Service: General;  Laterality: N/A;   PH IMPEDANCE STUDY N/A 11/04/2015   Procedure: PH IMPEDANCE STUDY;  Surgeon: Lynwood Celestia, MD;  Location: [facility];  Service: Endoscopy;  Laterality: N/A;   VAGINAL HYSTERECTOMY  2010   TVH--ovaries remain   Patient Active Problem List   Diagnosis Date Noted   Sjogren syndrome with keratoconjunctivitis (HCC) 12/09/2022   Other hemochromatosis 06/10/2021   Elevated LFTs 04/04/2021   Colitis 04/04/2021   Bacterial overgrowth syndrome 05/21/2020   Obesity 05/21/2020   History of endometriosis 05/21/2020   Constipation due to outlet dysfunction 02/05/2020   Gastroesophageal reflux disease 02/05/2020   Obstructive sleep apnea treated with continuous positive airway pressure (CPAP) 06/16/2017   Perennial allergic rhinitis  with a nonallergic component 01/31/2017   Asthmatic bronchitis 01/31/2017   GI symptoms 01/31/2017   Family history of colon cancer 01/27/2015   Chest pain 12/13/2012   Dyspnea 12/13/2012   DVT of lower extremity (deep venous thrombosis) (HCC) 01/03/1990    PCP: Gaile New, MD  REFERRING PROVIDER: Valery Ripple, MD  REFERRING DIAG: I89.0  THERAPY DIAG:  Lymphedema, not elsewhere classified  Rationale for Evaluation and Treatment: Rehabilitation  ONSET DATE: ~2014; exacerbation in 2023  SUBJECTIVE:                                                                                                                                                                                          SUBJECTIVE STATEMENT:  Pt presents for OT to address lower quadrant, including deep abdominal lymphatics, and LLE lymphedema w/ associated pain. Pt reports Doppler study ruled out DVT after  recently having sudden pain in the quadricept. This morning she rates at 5/10 and describes pain as swelling and point soreness.   PERTINENT HISTORY: CVI, OSA (no CPAP), GERD, Esophageal stricture, IBS, Lactose intolerant, Diarrhea, Hx LE DVT, recent RA dx  PAIN:  Are you having pain? Yes: 5/10 Pain location: R thigh (quads); LE, digestive discomfort Pain description: myalgia, tight, heavy, tingling, numbness, soreness Aggravating factors: standing, walking, dependent sitting > 30 minutes Relieving factors: elevation  PRECAUTIONS: Other: LYMPHEDEMA; Hx LLE DVT  WEIGHT BEARING RESTRICTIONS: No  FALLS:  Has patient fallen in last 6 months? No  LIVING ENVIRONMENT: Lives with: lives with spouse and daughter Lives in: House/apartment Stairs: Yes; Internal: 14 steps; yes and External: 4 steps; yes Has following equipment at home: None  OCCUPATION: Dipl[employer] Services operational officer, walking, desk work  LEISURE: family time  HAND DOMINANCE: right   PRIOR LEVEL OF FUNCTION: Independent  PATIENT GOALS: Feel  better, be able to be more active without pain, wear preferred street shoes. NEW 08/03/23: Be able to manage digestive discomfort and gut inflammation symptoms without medications  OBJECTIVE: Note: Objective measures were completed at Evaluation unless otherwise noted.  COGNITION:  Overall cognitive status: Within functional limits for tasks assessed   POSTURE: WFL  LE ROM: WFL  LE MMT: WFL  LYMPHEDEMA ASSESSMENTS: non-Ca related  INFECTIONS: denies cellulitis and wound hx  BLE COMPARATIVE LIMB VOLUMETRICS Initial 11/15/22  LANDMARK RIGHT (dominant)  R LEG (A-D) 3028.9 ml  R THIGH (E-G) 4809.60 ml  R FULL LIMB (A-G) 7838.5 ml  Limb Volume differential (LVD)  %  Volume change since initial %  Volume change overall V  (Blank rows = not tested)  LANDMARK LEFT  (Rx)  L LEG (A-D) 3189.9 ml  L THIGH (E-G) 4959.8 ml  L FULL LIMB (A-G) 8149.7 ml  Limb Volume differential (LVD)  LEG LVD = 5.32%, L>R; THIGH LVD = 3.12%, L>R; And full LLE LVD = 3.97%, L>R.   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  Skin  Description Hyper-Keratosis Peau d' Orange Shiny Tight Fibrotic/ Indurated Fatty Doughy Spongy/ boggy       R>L x  x   Skin dry Flaky WNL Macerated   mildly      Color Redness Varicosities Blanching Hemosiderin Stain Mottled        x   Odor Malodorous Yeast Fungal infection  WNL      x   Temperature Warm Cool wnl     x    Pitting Edema   1+ 2+ 3+ 4+ Non-pitting         x   Girth Symmetrical Asymmetrical                   Distribution    L>R toes to groin     Stemmer Sign Positive Negative   +L -R   Lymphorrhea History Of:  Present Absent     x    Wounds History Of Present Absent Venous Arterial Pressure Sheer     x        Signs of Infection Redness Warmth Erythema Acute Swelling Drainage Borders                    Sensation Light Touch Deep pressure Hypersensitivity   In tact Impaired In tact Impaired Absent Impaired   x  x  x     Nails WNL   Fungus nail dystrophy   x     Hair Growth Symmetrical Asymmetrical   x    Skin Creases Base of toes  Ankles   Base of Fingers knees       Abdominal pannus Thigh Lobules  Face/neck   x           GAIT: Distance walked: >500' Assistive device utilized: None Level of assistance: Complete Independence Comments: Functional ambulation and transfers [facility]  LYMPHEDEMA LIFE IMPACT SCALE (LLIS): Initial 11/15/22  50%  FOTO functional outcome measure: Deferred. FOTO discontinued.  TODAY'S TREATMENT:  MLD to abdominal lymphatics Pt edu for LE self care Pt edu for trunk rotation stretch in side lying (unwinding)  PATIENT EDUCATION:  Continued Pt/ CG edu for how function of cisterna chyli, part of the thoracic duct of deep abdominal lymphatics, assists with digestion by filtering many of the fats and proteins from the digestive system. Sub optimal lymphatic flow in this region may result in constipation, bloating, swelling, etc. MLD helps mobilize these protein and fats , and the rhythmic movement of manual lymphatic drainage stimulates digestive muscles and increase peristalsis. Provided printed resource for reference.  Topics include outcome of comparative limb volumetrics- starting limb volume differentials (LVDs), technology and gradient techniques used for short stretch, multilayer compression wrapping, simple self-MLD, therapeutic lymphatic pumping  exercises, skin/nail care, LE precautions,. compression garment recommendations and specifications, wear and care schedule and compression garment donning / doffing w assistive devices. Discussed progress towards all OT goals since commencing CDT. All questions answered to the Pt's satisfaction. Good return. Person educated: Patient  Education method: Explanation, Demonstration, and Handouts Education comprehension: verbalized understanding, returned demonstration, verbal cues required, and needs further education  HOME EXERCISE PROGRAM: BLE lymphatic pumping there ex using- 1 sets of 10 reps, each exercise in order-  1-2 x daily, bilaterally Simple self MLD 1 x daily Daily skin care to increase hydration, skin mobility and decrease infection risk- can be done during MLD Compression wraps 23/7 during intensive phase of CDT Compression garments/ devices during self-management phase of CDT  ASSESSMENT:  CLINICAL IMPRESSION: Pt tolerated MLD to deep abdominal lymphatics combined with diaphragmatic breathing in supine and side lying today without increased pain.  Finished with stationary circle to descending, transverse and ascending colon.  Pt continues to benefit from MLD to reduce chronic inflammation and toxins from the intestinal area. Although more research on MLD's effectiveness in treating IBS is needed to determine its efficacy conclusively, preliminary findings suggest that MLD offers symptomatic relief for some patients. To date, MLD to abdominal lymphatics has helped to stimulate peristalsis and to stimulate the parasympathetic nervous system such that Pt has been able to suspend frequent intestinal cleanses, reports decreased frequency of constipation and more regular bowel movements with regular MLD.Cont  1 x weekly as per POC.  11/15/22 Initial Evaluation: Gola Bribiesca is a 53 y.o. female presenting with very mild, stage II, LLE lymphedema 2/2 venous insufficiency and obesity. L Leg  swelling and associated pain swelling fluctuates. It has progressed over time, and now no longer resolves with elevation over night. RLE swelling limits balance during functional ambulation. It exacerbates infection and falls risk. LLE/RLQ lymphedema interferes with functional performance in all occupational domains, including basic and instrumental ADLs, productive activities, leisure pursuits and social participation. Pt will benefit from Occupational Therapy for Complete Decongestive Therapy (CDT) to restore function, to reduce physical and psychologic suffering associated with chronic, progressive lymphedema and associated pain, and to limit infection. CDT will include manual lymphatic drainage (MLD), skin care, therapeutic exercise and compression wraps and garment's devices. Without skilled OT for lymphedema care the condition will worsen and further functional decline is expected  Custom-made gradient compression garments and HOS devices are medically necessary because they are uniquely sized and shaped to fit the exact dimensions of the affected extremities, and to provide appropriate medical grade, graduated compression essential for optimally managing chronic, progressive lymphedema. Multiple custom compression garments are needed to ensure proper hygiene to limit infection risk. Custom compression garments should be replaced q 3-6 months When worn consistently  for optimal lipo-lymphedema self-management over time. HOS devices, medically necessary to limit fibrosis buildup in tissue, should be replaced q 2 years and PRN when worn out.      OBJECTIVE IMPAIRMENTS: decreased activity tolerance, decreased knowledge of condition, decreased knowledge of use of DME, increased edema, impaired sensation, pain, and chronic, progressive leg swelling.   ACTIVITY LIMITATIONS: dependent sitting, extended standing and/ or walking, squatting, and lower body dressing, fitting preferred street shoes  PARTICIPATION  LIMITATIONS: shopping, community activity, occupation, and yard work  PERSONAL FACTORS: 3+ comorbidities: Hx DVT,  OSA, CVI. Varicose veins are also affecting patient's functional outcome.   REHAB POTENTIAL: Good  EVALUATION COMPLEXITY: Moderate   GOALS: Goals reviewed with patient? Yes  SHORT TERM GOALS: Target date: 4th OT Rx visit  SHORT TERM GOALS: Target date: 4th OT Rx visit   Pt will demonstrate understanding of lymphedema precautions and prevention strategies with modified independence using a printed reference to identify at least 5 precautions and discussing how s/he may implement them into daily life to reduce risk of progression with extra time. Baseline:Max A Goal status: GOAL MET   Pt will be able to apply multilayer, knee length, gradient, compression wraps to one leg at a time with modified assistance (extra time and assistive device/s) to decrease limb volume, to limit infection risk, and to limit lymphedema progression.  Baseline: Dependent Goal status: GOAL DISCHARGED. Pt Going to compression garments instead  LONG TERM GOALS: Target date: 02/08/23  Given this patient's Intake score of 67%/100% on the functional outcomes FOTO tool, patient will experience an increase in function of 5 points to improve basic and instrumental ADLs performance, including lymphedema self-care.  Baseline: Max A Goal status:GOAL DISCHARGED.  FOTO TOOL DISCONTINUED AT CLINIC   Given this patient's Intake score of 50% on the Lymphedema Life Impact Scale (LLIS), patient will experience a reduction of at least 5 points in her perceived level of functional impairment resulting from lymphedema to improve functional performance and quality of life (QOL). Baseline: 50% Goal status: PROGRESSING  Pt will achieve at least a 10% volume reduction in B legs to return limb to typical size and shape, to limit infection risk and LE progression, to decrease pain, to improve function. Baseline:  Dependent Goal status: PROGRESSING  4.  Pt will obtain proper compression garments/devices and achieve modified independence (extra time + assistive devices) with donning/doffing to optimize limb volume reductions and limit LE  progression over time. Baseline:  Goal status:GOAL MET  5.  During Intensive phase CDT , with modified independence, Pt will achieve at least 85% compliance with all lymphedema self-care home program components, including daily skin care, compression wraps and /or garments, simple self MLD and lymphatic pumping therex to habituate LE self care protocol  into ADLs for optimal LE self-management over time. Baseline: Dependent Goal status:GOAL MET   PLAN:  OT FREQUENCY: 1 /week  OT DURATION: 12 weeks and PRN  PLANNED INTERVENTIONS: 97110-Therapeutic exercises, 97530- Therapeutic activity, 97535- Self Care, [id number]- Manual therapy, Patient/Family education, Manual lymph drainage, Compression bandaging, and fit with appropriate compression garments  PLAN FOR NEXT SESSION:  Cont MLD as established Pt and family edu for LE self-care  Zebedee Dec, MS, OTR/L, CLT-LANA 08/18/23 12:18 PM

## 2023-08-18 NOTE — Addendum Note (Signed)
 Addended by: MYRIAM ZEBEDEE CROME on: 08/18/2023 12:26 PM   Modules accepted: Orders

## 2023-08-21 ENCOUNTER — Other Ambulatory Visit: Payer: Self-pay

## 2023-08-21 ENCOUNTER — Encounter

## 2023-08-21 DIAGNOSIS — M25631 Stiffness of right wrist, not elsewhere classified: Secondary | ICD-10-CM | POA: Diagnosis not present

## 2023-08-21 DIAGNOSIS — M25641 Stiffness of right hand, not elsewhere classified: Secondary | ICD-10-CM | POA: Diagnosis not present

## 2023-08-21 NOTE — Progress Notes (Signed)
 Clinical Intervention Note  Clinical Intervention Notes: Patient called with concerns regarding possible side effects that she has experienced since starting Humira . Side effects including bleeding from injection site (enough to need a bandage), constipation, headache, fatigue, muscle cramps/soreness. Patient stated she has tried injections in her stomach and back of the arm and both causes significant bleeding. 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 Advised patient that this may come from the pen mechanism as opposed to the medication itself. Patient reported already having GI issues and unsure if the Humira  may be aggravating this. Advised patient to contact her rheumatologist regarding these potential side effects since they have become less tolerable and are not improving. Patient was understanding and had no further questions.   Clinical Intervention Outcomes: Prevention of an adverse drug event   Advertising account planner

## 2023-08-22 ENCOUNTER — Ambulatory Visit: Payer: Self-pay

## 2023-08-22 DIAGNOSIS — M62838 Other muscle spasm: Secondary | ICD-10-CM | POA: Diagnosis not present

## 2023-08-22 DIAGNOSIS — R279 Unspecified lack of coordination: Secondary | ICD-10-CM | POA: Diagnosis not present

## 2023-08-22 DIAGNOSIS — R102 Pelvic and perineal pain: Secondary | ICD-10-CM | POA: Diagnosis not present

## 2023-08-22 DIAGNOSIS — M6281 Muscle weakness (generalized): Secondary | ICD-10-CM | POA: Diagnosis not present

## 2023-08-22 DIAGNOSIS — M5459 Other low back pain: Secondary | ICD-10-CM

## 2023-08-22 NOTE — Therapy (Signed)
 OUTPATIENT PHYSICAL THERAPY FEMALE PELVIC TREATMENT   Patient Name: Karen [name] MRN: [id number] DOB:14-Aug-1970, 53 y.o., female Today's Date: 08/22/2023  END OF SESSION:  PT End of Session - 08/22/23 1620     Visit Number 47    Date for PT Re-Evaluation 10/03/23    Authorization Type [name]    Authorization Time Period signed dry needling consent form 08/08/23    PT Start Time 1615    PT Stop Time 1655    PT Time Calculation (min) 40 min    Activity Tolerance Patient tolerated treatment well    Behavior During Therapy [facility] for tasks assessed/performed                     Past Medical History:  Diagnosis Date   Adenomyosis    Anemia    Arthritis 2013   L knee gets steroid injections    Arthritis, rheumatic, acute or subacute    Asthmatic bronchitis 01/31/2017   Back pain    Chronic constipation    Diarrhea    DVT of lower extremity (deep venous thrombosis) (HCC)    Dysmenorrhea    Endometriosis    Esophageal stricture    G6PD deficiency    GERD (gastroesophageal reflux disease)    History of kidney stones    History of uterine fibroid    IBS (irritable bowel syndrome)    Interstitial cystitis    Lactose intolerance    Migraine    with aura   Other hemochromatosis 06/10/2021   PONV (postoperative nausea and vomiting)    Recurrent upper respiratory infection (URI)    Sebaceous cyst of breast, right lower inner quadrant 10/25/2012   Excised 11/21/12    Sleep apnea    Syncope, non cardiac    Past Surgical History:  Procedure Laterality Date   ARTHROSCOPIC HAGLUNDS REPAIR     BREAST CYST EXCISION Right 11/21/2012   Procedure: CYST EXCISION BREAST;  Surgeon: Sherlean JINNY Laughter, MD;  Location: [facility];  Service: General;  Laterality: Right;   BREAST CYST EXCISION Bilateral 01/18/2022   Procedure: EXCISION OF SEBACEOUS CYST BILATERAL BREAST;  Surgeon: Vanderbilt Ned, MD;  Location: [facility];  Service:  General;  Laterality: Bilateral;   BREAST EXCISIONAL BIOPSY Right 11/2012   CESAREAN SECTION  04/19/1993   CHOLECYSTECTOMY     COLONOSCOPY  05/02/2011   Procedure: COLONOSCOPY;  Surgeon: Lynwood LITTIE Celestia Mickey., MD;  Location: [facility];  Service: Endoscopy;  Laterality: N/A;   colonoscopy  11/04/2014   DENTAL SURGERY  03/06/2018   2 surgeries ( 03/06/2018 and 04/06/2018)   to remove two separate benign tumors   ESOPHAGEAL MANOMETRY N/A 11/04/2015   Procedure: ESOPHAGEAL MANOMETRY (EM);  Surgeon: Lynwood Celestia, MD;  Location: [facility];  Service: Endoscopy;  Laterality: N/A;   ESOPHAGOGASTRODUODENOSCOPY  05/02/2011   Procedure: ESOPHAGOGASTRODUODENOSCOPY (EGD);  Surgeon: Lynwood LITTIE Celestia Mickey., MD;  Location: [facility];  Service: Endoscopy;  Laterality: N/A;   ESOPHAGOGASTRODUODENOSCOPY N/A 09/01/2014   Procedure: ESOPHAGOGASTRODUODENOSCOPY (EGD);  Surgeon: Lynwood Celestia, MD;  Location: [facility];  Service: Endoscopy;  Laterality: N/A;   IR IMAGING GUIDED PORT INSERTION  07/06/2023   KNEE SURGERY     LAPAROSCOPY     x 4   LESION REMOVAL N/A 01/18/2022   Procedure: EXCISION OF SEBACEOUS CYSTS CHEST AND NECK;  Surgeon: Vanderbilt Ned, MD;  Location: [facility];  Service: General;  Laterality: N/A;   PH IMPEDANCE STUDY  N/A 11/04/2015   Procedure: PH IMPEDANCE STUDY;  Surgeon: Lynwood Bohr, MD;  Location: [facility];  Service: Endoscopy;  Laterality: N/A;   VAGINAL HYSTERECTOMY  2010   TVH--ovaries remain   Patient Active Problem List   Diagnosis Date Noted   Sjogren syndrome with keratoconjunctivitis (HCC) 12/09/2022   Other hemochromatosis 06/10/2021   Elevated LFTs 04/04/2021   Colitis 04/04/2021   Bacterial overgrowth syndrome 05/21/2020   Obesity 05/21/2020   History of endometriosis 05/21/2020   Constipation due to outlet dysfunction 02/05/2020   Gastroesophageal reflux disease 02/05/2020   Obstructive sleep apnea treated with continuous positive airway  pressure (CPAP) 06/16/2017   Perennial allergic rhinitis with a nonallergic component 01/31/2017   Asthmatic bronchitis 01/31/2017   GI symptoms 01/31/2017   Family history of colon cancer 01/27/2015   Chest pain 12/13/2012   Dyspnea 12/13/2012   DVT of lower extremity (deep venous thrombosis) (HCC) 01/03/1990    PCP: Elliot Charm, MD  REFERRING PROVIDER: Cleotilde Ronal RAMAN, MD   REFERRING DIAG: M62.89 (ICD-10-CM) - Pelvic floor dysfunction  THERAPY DIAG:  Other muscle spasm  Muscle weakness (generalized)  Unspecified lack of coordination  Other low back pain  Pelvic pain  Rationale for Evaluation and Treatment: Rehabilitation  ONSET DATE: chronic  SUBJECTIVE:                                                                                                                                                                                           SUBJECTIVE STATEMENT:  Pt states that she had new version of Humira  without preservatives Friday and still had a reaction to it. She did a cleanse over the weekend. GI told her to add Miralax  as needed.   PAIN: 08/22/23 Are you having pain? Yes NPRS scale: 4/10 Pain location: Lower abdominal pain, Lt sided pain, low back pain  Pain type: turning, knotted, pressure Pain description: intermittent and constant   Aggravating factors: constipation or frequent bowel movements/constantly having bowel movements  Relieving factors: exercises (bowel massage, happy baby, spinal twists, walking)  PRECAUTIONS: None  WEIGHT BEARING RESTRICTIONS: No  FALLS:  Has patient fallen in last 6 months? No  LIVING ENVIRONMENT: Lives with: lives with their spouse Lives in: House/apartment  OCCUPATION: nurse, in admin  PLOF: Independent  PATIENT GOALS: decrease pain and have more normal bowel movements  PERTINENT HISTORY:  Vaginal hysterectomy, DVT LE, endometriosis, interstitial cystitis, exploratory laps for endometriosis,  c-section, cholecystectomy, RA diagnosed 06/2023 Sexual abuse: No  BOWEL MOVEMENT: Pain with bowel movement: Yes Type of bowel movement:Type (Bristol Stool Scale) 1-7 (sometimes full range in the same bowel movement), Frequency sometimes many  times a day, sometimes up to 4 days in between, and Strain Yes Fully empty rectum: No Leakage: No Pads: No Fiber supplement: No - has attempted metamucil and it made constipation worse  URINATION: Pain with urination: Yes Fully empty bladder: No Stream: varies  Urgency: Yes: all the time Frequency: multiple times an hours  Leakage: Urge to void, Walking to the bathroom, Coughing, Sneezing, Laughing, and Exercise Pads: Yes: daily  INTERCOURSE: Pain with intercourse: Initial Penetration and During Penetration Ability to have vaginal penetration:  Yes: with pain Climax: non painful WNL   PREGNANCY: Vaginal deliveries 1 Tearing Yes: 3rd degree tear C-section deliveries 1 Currently pregnant No  PROLAPSE: Periodically will feel vaginal bulging, heaviness in lower abdomen   OBJECTIVE:  05/04/23:               Internal Pelvic Floor: mild tenderness in bil levator ani, but no increase in tension; with internal rectal exam, she was demonstrating paradoxical contraction when trying to eccentrically lengthen with exhale and had very hard time correcting  Patient confirms identification and approves PT to assess internal pelvic floor and treatment Yes  PELVIC MMT:   MMT eval  Vaginal 2/5, 3 second hold, 3 repeat contractions  Internal Anal Sphincter 1/5  External Anal Sphincter 2/5  Puborectalis 1/5  Diastasis Recti   (Blank rows = not tested)        TONE: WNL   PROLAPSE: Grade 2 anterior vaginal wall laxity  04/25/23: Curl-up test: abdominal distortion Transversus abdominus: week, difficulty getting active contraction (not been working on strengthening)    LUMBARAROM/PROM:  A/PROM A/PROM  Eval (% available) 11/09/22 (% available)  04/25/23 (% available)  Flexion 50 90 90  Extension 25, pain anteriorly  25, pain in low back 50  Right lateral flexion 50, pain anteriorly  75, pain on right 50, pain on Rt  Left lateral flexion 50, pain anteriorly  75, pain on right 75   (Blank rows = not tested)  11/09/22: No internal rectal or vaginal pelvic floor exam performed due to high level of skin irritation   Weak transversus abdominus contraction  Abdominal restriction and tenderness in bil lower quadrant  Unable to perform pelvic tilt with appropriate coordination  LUMBARAROM/PROM:  A/PROM A/PROM  Eval (% available) 11/09/22 (% available)  Flexion 50 90  Extension 25, pain anteriorly  25, pain in low back  Right lateral flexion 50, pain anteriorly  75, pain on right  Left lateral flexion 50, pain anteriorly  75, pain on right   (Blank rows = not tested) 07/21/22: standing prolapse assessment demonstrated grade 2 anterior vaginal wall laxity   06/08/22:               External Perineal Exam: WNL                              Internal Pelvic Floor: burning reported with palpation of superficial muscles; deep aching/pressure with palpation of Lt levator ani   Patient confirms identification and approves PT to assess internal pelvic floor and treatment Yes  PELVIC MMT:   MMT eval  Vaginal 3/5  Internal Anal Sphincter 1/5  External Anal Sphincter 2/5  Puborectalis 1/5  Diastasis Recti   (Blank rows = not tested)        TONE: High in Lt levator ani   PROLAPSE: Not able to tell this session due to dyssynergic pelvic floor contraction  06/01/22: COGNITION: Overall cognitive status: Within functional limits for tasks assessed     SENSATION: Light touch: Appears intact Proprioception: Appears intact  GAIT: Comments: decreased hip extension, forward flexed trunk  POSTURE: rounded shoulders, forward head, decreased lumbar lordosis, increased thoracic kyphosis, posterior pelvic tilt, and flexed trunk     LUMBARAROM/PROM:  A/PROM A/PROM  Eval (% available)  Flexion 50  Extension 25, pain anteriorly   Right lateral flexion 50, pain anteriorly   Left lateral flexion 50, pain anteriorly    (Blank rows = not tested)   PALPATION:   General  Significant abdominal restriction and tenderness; decreased rib mobility with mobilization and breathing    TODAY'S TREATMENT:                                                                                                                              DATE:  08/22/23 Manual: ILU bowel mobilization  Abdominal scar tissue mobilization Ileocecal valve mobilization External rectum mobilization Myofascial release to abdomen with push/pull alternating forces horizontally  Exercises: Seated side bending stretch over bolster 10 breaths bil Seated thoracic openers 6x bil Seated forward fold 10 breaths  Seated thoracic extensions over foam roller 10x   08/15/23 Manual: ILU bowel mobilization  Abdominal scar tissue mobilization Ileocecal valve mobilization External rectum mobilization Exercises: Open books 10x bil Clam shells 10x bil Supine bridge + hip adduction 2 x 10 Supine bridge + hip abduction red band 2 x 10 Supine resisted march red band 2 x 10   08/07/23 Manual: ILU bowel mobilization  Abdominal scar tissue mobilization Ileocecal valve mobilization External rectum mobilization Exercises: Supine bridge with hip adduction 2 x 10 Supine hip abduction red band 2 x 10 Supine piriformis stretch 60 sec bil Supine resisted march red band 2 x 10 Butterfly stretch 60 sec Lower trunk rotation 2 x 10 Single knee to chest 5x bil   PATIENT EDUCATION:  Education details: See above Person educated: Patient Education method: Explanation, Demonstration, Tactile cues, Verbal cues, and Handouts Education comprehension: verbalized understanding  HOME EXERCISE PROGRAM: [id number]  ASSESSMENT:  CLINICAL IMPRESSION: Pt did perform bowel  cleanse yesterday due to continued stool burden. She emptied well, but is bloated and uncomfortable today. Manual techniques performed to help improve bloating and make sure that bowels continue moving after cleanse. She had good improvement in gut sounds throughout manual techniques. We performed mobility activities in seated position to help improve rib cage and abdominal mobility with good tolerance. She will continue to benefit from skilled PT intervention in order to decrease pain, improve bowel movements, and decrease urinary urgency/incontinence.     OBJECTIVE IMPAIRMENTS: decreased activity tolerance, decreased coordination, decreased endurance, decreased mobility, decreased strength, increased fascial restrictions, increased muscle spasms, impaired tone, postural dysfunction, and pain.   ACTIVITY LIMITATIONS: lifting, bending, continence, and locomotion level  PARTICIPATION LIMITATIONS: interpersonal relationship, community activity, and occupation  PERSONAL FACTORS: 1 comorbidity: medical history are also affecting patient's functional outcome.  REHAB POTENTIAL: Good  CLINICAL DECISION MAKING: Stable/uncomplicated  EVALUATION COMPLEXITY: Low   GOALS: Goals reviewed with patient? Yes  SHORT TERM GOALS: Updated 08/01/23  Pt will be independent with HEP.   Baseline: Goal status: MET 07/12/22  2.  Pt will be independent with diaphragmatic breathing and down training activities in order to improve pelvic floor relaxation.  Baseline:  Goal status: MET 07/12/22  3.  Pt will be independent with the knack, urge suppression technique, and double voiding in order to improve bladder habits and decrease urinary incontinence.   Baseline:  Goal status: MET 09/22/22  4.  Pt will be independent with use of squatty potty, relaxed toileting mechanics, and improved bowel movement techniques in order to increase ease of bowel movements and complete evacuation.   Baseline:  Goal status: MET  11/09/22  5.  Pt will be able to correctly perform diaphragmatic breathing and appropriate pressure management in order to prevent worsening vaginal wall laxity and improve pelvic floor A/ROM.   Baseline:  Goal status: MET 01/18/23  LONG TERM GOALS: Updated 08/22/23  Pt will be independent with advanced HEP.   Baseline:  Goal status: IN PROGRESS 08/22/23  2.  Pt will demonstrate normal pelvic floor muscle tone and A/ROM, able to achieve 4/5 strength with contractions and 10 sec endurance, in order to provide appropriate lumbopelvic support in functional activities.   Baseline: not assessed today per patient request  Goal status: IN PROGRESS 08/22/23  3.  Pt will increase all impaired lumbar A/ROM by 25% without pain.  Baseline: Pt seen a little bit of loss in some and improvement in extension Goal status:  IN PROGRESS 08/22/23  4.  Pt will report pain no higher than 3/10 with any activity. Baseline: pain has gotten up to 5/10, but currently at a 3/10 Goal status:  IN PROGRESS 08/22/23  5.  Pt will report 0/10 pain with vaginal penetration in order to improve intimate relationship with partner.    Baseline: no recent intercourse attempts  Goal status:  IN PROGRESS 08/22/23  6.  Pt will be able to go 2-3 hours in between voids without urgency or incontinence in order to improve QOL and perform all functional activities with less difficulty.   Baseline:  Goal status: MET 02/21/23  7.  Pt will report no leaks with laughing, coughing, sneezing in order to improve comfort with interpersonal relationships and community activities.   Baseline: Pt states that she feels 30-40% better Goal status:  IN PROGRESS 08/22/23  8.  Pt will have consistent bowel movements 4-5x/week without straining or pain.  Baseline:  Goal status:  MET 02/21/23  PLAN:  PT FREQUENCY: 1-2x/week  PT DURATION: 6 months  PLANNED INTERVENTIONS: Therapeutic exercises, Therapeutic activity, Neuromuscular  re-education, Balance training, Gait training, Patient/Family education, Self Care, Joint mobilization, Dry Needling, Biofeedback, and Manual therapy  PLAN FOR NEXT SESSION: strengthening program - reinforce importance and go over some exercises each session; internal pelvic floor muscle exam  Josette Mares, PT, DPT08/19/254:49 PM

## 2023-08-23 ENCOUNTER — Encounter: Admitting: Occupational Therapy

## 2023-08-23 ENCOUNTER — Encounter: Payer: Self-pay | Admitting: Physical Therapy

## 2023-08-23 ENCOUNTER — Ambulatory Visit: Admitting: Physical Therapy

## 2023-08-23 DIAGNOSIS — M542 Cervicalgia: Secondary | ICD-10-CM

## 2023-08-23 DIAGNOSIS — M62838 Other muscle spasm: Secondary | ICD-10-CM | POA: Diagnosis not present

## 2023-08-23 DIAGNOSIS — S46812A Strain of other muscles, fascia and tendons at shoulder and upper arm level, left arm, initial encounter: Secondary | ICD-10-CM

## 2023-08-23 DIAGNOSIS — M6281 Muscle weakness (generalized): Secondary | ICD-10-CM

## 2023-08-23 DIAGNOSIS — S29012A Strain of muscle and tendon of back wall of thorax, initial encounter: Secondary | ICD-10-CM | POA: Diagnosis not present

## 2023-08-23 NOTE — Therapy (Signed)
 OUTPATIENT PHYSICAL THERAPY CERVICAL TREATMENT   Patient Name: Karen [name] MRN: [id number] DOB:1970-10-01, 53 y.o., female Today's Date: 08/23/2023  END OF SESSION:  PT End of Session - 08/23/23 1123     Visit Number 4   (ortho count 4)   Date for PT Re-Evaluation 10/03/23    Authorization Type [name]    Authorization Time Period signed dry needling consent form 08/08/23    PT Start Time 1019    PT Stop Time 1104    PT Time Calculation (min) 45 min    Activity Tolerance Patient tolerated treatment well    Behavior During Therapy [facility] for tasks assessed/performed             Past Medical History:  Diagnosis Date   Adenomyosis    Anemia    Arthritis 2013   L knee gets steroid injections    Arthritis, rheumatic, acute or subacute    Asthmatic bronchitis 01/31/2017   Back pain    Chronic constipation    Diarrhea    DVT of lower extremity (deep venous thrombosis) (HCC)    Dysmenorrhea    Endometriosis    Esophageal stricture    G6PD deficiency    GERD (gastroesophageal reflux disease)    History of kidney stones    History of uterine fibroid    IBS (irritable bowel syndrome)    Interstitial cystitis    Lactose intolerance    Migraine    with aura   Other hemochromatosis 06/10/2021   PONV (postoperative nausea and vomiting)    Recurrent upper respiratory infection (URI)    Sebaceous cyst of breast, right lower inner quadrant 10/25/2012   Excised 11/21/12    Sleep apnea    Syncope, non cardiac    Past Surgical History:  Procedure Laterality Date   ARTHROSCOPIC HAGLUNDS REPAIR     BREAST CYST EXCISION Right 11/21/2012   Procedure: CYST EXCISION BREAST;  Surgeon: Sherlean JINNY Laughter, MD;  Location: [facility];  Service: General;  Laterality: Right;   BREAST CYST EXCISION Bilateral 01/18/2022   Procedure: EXCISION OF SEBACEOUS CYST BILATERAL BREAST;  Surgeon: Vanderbilt Ned, MD;  Location: [facility];  Service:  General;  Laterality: Bilateral;   BREAST EXCISIONAL BIOPSY Right 11/2012   CESAREAN SECTION  04/19/1993   CHOLECYSTECTOMY     COLONOSCOPY  05/02/2011   Procedure: COLONOSCOPY;  Surgeon: Lynwood LITTIE Celestia Mickey., MD;  Location: [facility];  Service: Endoscopy;  Laterality: N/A;   colonoscopy  11/04/2014   DENTAL SURGERY  03/06/2018   2 surgeries ( 03/06/2018 and 04/06/2018)   to remove two separate benign tumors   ESOPHAGEAL MANOMETRY N/A 11/04/2015   Procedure: ESOPHAGEAL MANOMETRY (EM);  Surgeon: Lynwood Celestia, MD;  Location: [facility];  Service: Endoscopy;  Laterality: N/A;   ESOPHAGOGASTRODUODENOSCOPY  05/02/2011   Procedure: ESOPHAGOGASTRODUODENOSCOPY (EGD);  Surgeon: Lynwood LITTIE Celestia Mickey., MD;  Location: [facility];  Service: Endoscopy;  Laterality: N/A;   ESOPHAGOGASTRODUODENOSCOPY N/A 09/01/2014   Procedure: ESOPHAGOGASTRODUODENOSCOPY (EGD);  Surgeon: Lynwood Celestia, MD;  Location: [facility];  Service: Endoscopy;  Laterality: N/A;   IR IMAGING GUIDED PORT INSERTION  07/06/2023   KNEE SURGERY     LAPAROSCOPY     x 4   LESION REMOVAL N/A 01/18/2022   Procedure: EXCISION OF SEBACEOUS CYSTS CHEST AND NECK;  Surgeon: Vanderbilt Ned, MD;  Location: [facility];  Service: General;  Laterality: N/A;   PH IMPEDANCE STUDY N/A 11/04/2015   Procedure:  PH IMPEDANCE STUDY;  Surgeon: Lynwood Bohr, MD;  Location: [facility];  Service: Endoscopy;  Laterality: N/A;   VAGINAL HYSTERECTOMY  2010   TVH--ovaries remain   Patient Active Problem List   Diagnosis Date Noted   Sjogren syndrome with keratoconjunctivitis (HCC) 12/09/2022   Other hemochromatosis 06/10/2021   Elevated LFTs 04/04/2021   Colitis 04/04/2021   Bacterial overgrowth syndrome 05/21/2020   Obesity 05/21/2020   History of endometriosis 05/21/2020   Constipation due to outlet dysfunction 02/05/2020   Gastroesophageal reflux disease 02/05/2020   Obstructive sleep apnea treated with continuous positive airway  pressure (CPAP) 06/16/2017   Perennial allergic rhinitis with a nonallergic component 01/31/2017   Asthmatic bronchitis 01/31/2017   GI symptoms 01/31/2017   Family history of colon cancer 01/27/2015   Chest pain 12/13/2012   Dyspnea 12/13/2012   DVT of lower extremity (deep venous thrombosis) (HCC) 01/03/1990    PCP: Elliot Charm, MD   REFERRING PROVIDER: Huey Redell Fallow, PA-C  REFERRING DIAG: M54.2 (ICD-10-CM) - Cervicalgia  THERAPY DIAG:  Other muscle spasm  Muscle weakness (generalized)  Cervicalgia  Strain of left trapezius muscle, initial encounter  Rationale for Evaluation and Treatment: Rehabilitation  ONSET DATE: July 2025  SUBJECTIVE:                                                                                                                                                                                                         SUBJECTIVE STATEMENT: Patient reports she is okay today. She has pain in her low back and her lower cervical spine.  Eval: Reports onset of L UT pain following port placement into R shoulder Hand dominance: Right  PERTINENT HISTORY:  07/21/2023 Assessment: - suspect left-sided cervical/ trapezius muscle spasm - reassured by absence of red flag signs/ symptoms at this time   Concurrent lymphedema PAIN: 08/08/23 Are you having pain? Yes: NPRS scale: 5/10 Pain location: L UT Pain description: ache, cramp Aggravating factors: activity Relieving factors: heat, medication  PRECAUTIONS: None  RED FLAGS: None     WEIGHT BEARING RESTRICTIONS: No  FALLS:  Has patient fallen in last 6 months? No  OCCUPATION: [employer] Employee  PLOF: Independent  PATIENT GOALS: To manage my neck pain  NEXT MD VISIT: TBD  OBJECTIVE:  Note: Objective measures were completed at Evaluation unless otherwise noted.  DIAGNOSTIC FINDINGS:  none  PATIENT SURVEYS:  NDI: 28/50 56% perceived disability  Minimum Detectable  Change (90% confidence): 5 points or 10% points   POSTURE: rounded shoulders and forward head  PALPATION: Global tenderness throughout  B UT and posterior shoulder girdle   CERVICAL ROM:   Active ROM A/PROM (deg) eval  Flexion 75%P!  Extension 75%P!  Right lateral flexion 75%P!  Left lateral flexion 75%P!  Right rotation 75%P!  Left rotation 75%P!   (Blank rows = not tested)  UPPER EXTREMITY ROM:  Active ROM Right eval Left eval  Shoulder flexion 150 150  Shoulder extension    Shoulder abduction 150 150  Shoulder adduction    Shoulder extension    Shoulder internal rotation    Shoulder external rotation    Elbow flexion    Elbow extension    Wrist flexion    Wrist extension    Wrist ulnar deviation    Wrist radial deviation    Wrist pronation    Wrist supination     (Blank rows = not tested)  UPPER EXTREMITY MMT:  MMT Right eval Left eval  Shoulder flexion    Shoulder extension    Shoulder abduction    Shoulder adduction    Shoulder extension    Shoulder internal rotation    Shoulder external rotation    Middle trapezius    Lower trapezius    Elbow flexion    Elbow extension    Wrist flexion    Wrist extension    Wrist ulnar deviation    Wrist radial deviation    Wrist pronation    Wrist supination    Grip strength     (Blank rows = not tested)  CERVICAL SPECIAL TESTS:  Neck flexor muscle endurance test: Negative  FUNCTIONAL TESTS:  N/A  TREATMENT                                                                                                                       DATE: 08/23/2023 Review of chin tuck performance Seated upper trap stretch 2 x 20 seconds bil  Standing scap squeeze with small pool noodle x 12 Standing with small pool noodle between shoulder blades + shoulder ER with red TB x 12 Standing with small pool noodle between shoulder blades + shoulder abduction with red TB x 12 Standing shoulder row & extension  with red TB 2 x  10 Cervical Melt method with foam roll (flexion/extension and rotation) x 20 each direction Supine LTR x 8 each side 5 sec hold Hooklying TA activation 2 x 10 Hooklying TA + ball squeeze 2 x 10 TA activation + SLR x 8 bilateral  Manual: therapy rolling to left quads and adductors for improved muscle elongation and decreased cramps Demonstrated standing and sitting quad and hamstring stretch for patient to perform after her walks    08/16/2023 Updated on status and Pain Seated scap squeeze x 12 Seated upper trap stretch 2 x 30 seconds bil (verbal cues for correct performance) Seated levator stretch 2 x 20 seconds bil Seated chin tucks 2 x 10 Standing shoulder row & extension  with red TB 2 x 10 Standing shoulder abduction with red TB 2 x 10  Open Books at wall x 5 each direction Blue stability ball stretch (forwards) x 8 Manual: STM to bilateral upper trap and cervical paraspinals for decreased pain and improved muscle length   08/08/23 Trigger Point Dry Needling  Initial Treatment: Pt instructed on Dry Needling rational, procedures, and possible side effects. Pt instructed to expect mild to moderate muscle soreness later in the day and/or into the next day.  Pt instructed in methods to reduce muscle soreness. Pt instructed to continue prescribed HEP. Patient was educated on signs and symptoms of infection and other risk factors and advised to seek medical attention should they occur.  Patient verbalized understanding of these instructions and education.   Patient Verbal Consent Given: Yes Education Handout Provided: Yes Muscles Treated: Bil upper traps and levator scapulae  Electrical Stimulation Performed: No Treatment Response/Outcome: significant twitch response and palpable improvement in muscle restriction and trigger points; painful response from patient  Manual: Soft tissue mobilization to bil upper traps, cervical paraspinals, levator, and suboccipitals in prone   Exercises: Seated scapular retraction 10x Seated shoulder rolls 10x Seated shoulder shrugs 10x Seated upper trap stretch 2 x 30 seconds bil Seated levator stretch 2 x 20 seconds bil Therapeutic activities: Self massage with tennis ball in pillow case leaning up against wall to help reduce scapular restriction   Eval and HEP Self Care: Additional minutes spent for educating on updated Therapeutic Home Exercise Program as well as comparing current status to condition at start of symptoms. This included exercises focusing on stretching, strengthening, with focus on eccentric aspects. Long term goals include an improvement in range of motion, strength, endurance as well as avoiding reinjury. Patient's frequency would include in 1-2 times a day, 3-5 times a week for a duration of 6-12 weeks. Proper technique shown and discussed handout in great detail. All questions were discussed and addressed.      PATIENT EDUCATION:  Education details: Discussed eval findings, rehab rationale and POC and patient is in agreement  Person educated: Patient Education method: Explanation and Handouts Education comprehension: verbalized understanding and needs further education  HOME EXERCISE PROGRAM: Access Code: [email or web address] URL: [email or web address] Date: 08/23/2023 Prepared by: Kristeen Sar  Exercises - Seated Shoulder Shrugs  - 2 x daily - 5 x weekly - 2 sets - 10 reps - Seated Scapular Retraction  - 2 x daily - 5 x weekly - 2 sets - 10 reps - Gentle Levator Scapulae Stretch  - 1 x daily - 7 x weekly - 1 sets - 2 reps - 30 seconds hold - Seated Cervical Sidebending Stretch  - 1 x daily - 7 x weekly - 1 sets - 2 reps - 30 seconds hold - Quadricep Stretch with Chair and Counter Support  - 1 x daily - 7 x weekly - 2 sets - 20-30s hold - Standing Hamstring Stretch with Step  - 1 x daily - 7 x weekly - 2 sets - 20-30s hold ASSESSMENT:  CLINICAL IMPRESSION: Maribell presents to skilled therapy  verbalizing increased pain in her low back and CT junction. She tolerated postural strengthening exercises well with no increased pain. Incorporated hooklying TA activation for core strengthening. Patient required verbal and visual cues for correct exercise performance. With hooklying exercises patient's right arm with supported with a yoga block due to discomfort with her port. Overall, patient should progress well with skilled therapy.    OBJECTIVE IMPAIRMENTS: decreased activity tolerance, decreased mobility, decreased ROM, increased fascial restrictions, increased muscle spasms, impaired UE functional use, postural  dysfunction, and pain.   ACTIVITY LIMITATIONS: carrying, lifting, bending, bed mobility, and reach over head  PERSONAL FACTORS: Age, Fitness, Past/current experiences, and 1 comorbidity: lymphedema are also affecting patient's functional outcome.   REHAB POTENTIAL: Good  CLINICAL DECISION MAKING: Stable/uncomplicated  EVALUATION COMPLEXITY: Low   GOALS: Goals reviewed with patient? No    SHORT TERM GOALS=LONG TERM GOALS: Target date: 10/03/23  Patient to demonstrate independence in HEP  Baseline: [email or web address] Goal status: INITIAL  2.  Patient will acknowledge 4/10 pain at least once during episode of care   Baseline: 8/10 Goal status: INITIAL  3.  Patient will score at least 20/50 on NDI to signify clinically meaningful improvement in functional abilities.   Baseline: 28/50 Goal status: INITIAL  4.  160d AROM in B shoulder flexion/abduction Baseline: 150d Goal status: INITIAL  5.  75% AROM cervical spine with minimal pain Baseline: 75% with moderate pain Goal status: INITIAL   PLAN:  PT FREQUENCY: 1-2x/week  PT DURATION: 6 weeks  PLANNED INTERVENTIONS: 97110-Therapeutic exercises, 97530- Therapeutic activity, 97112- Neuromuscular re-education, 97535- Self Care, and [id number]- Manual therapy  PLAN FOR NEXT SESSION:assess updated HEP; postural strengthening;  manual techniques as appropriate, aerobic tasks, ROM and flexibility activities, strengthening and PREs, TPDN  Kristeen Sar, PT 08/23/23 11:24 AM

## 2023-08-24 ENCOUNTER — Ambulatory Visit: Admitting: Occupational Therapy

## 2023-08-24 DIAGNOSIS — I890 Lymphedema, not elsewhere classified: Secondary | ICD-10-CM | POA: Diagnosis not present

## 2023-08-24 NOTE — Therapy (Unsigned)
 OUTPATIENT OCCUPATIONAL THERAPY TREATMENT NOTE  BLE/ BLQ LYMPHEDEMA  Patient Name: Karen [name] MRN: [id number] DOB:1970-09-10, 53 y.o., female Today's Date: 08/25/2023  REPORTING PERIOD:  END OF SESSION:   OT End of Session - 08/24/23 1308     Visit Number 58    Number of Visits 72    Date for OT Re-Evaluation 11/16/23    OT Start Time 0105    OT Stop Time 0205    OT Time Calculation (min) 60 min    Activity Tolerance Patient tolerated treatment well;No increased pain    Behavior During Therapy [facility] for tasks assessed/performed            Past Medical History:  Diagnosis Date   Adenomyosis    Anemia    Arthritis 2013   L knee gets steroid injections    Arthritis, rheumatic, acute or subacute    Asthmatic bronchitis 01/31/2017   Back pain    Chronic constipation    Diarrhea    DVT of lower extremity (deep venous thrombosis) (HCC)    Dysmenorrhea    Endometriosis    Esophageal stricture    G6PD deficiency    GERD (gastroesophageal reflux disease)    History of kidney stones    History of uterine fibroid    IBS (irritable bowel syndrome)    Interstitial cystitis    Lactose intolerance    Migraine    with aura   Other hemochromatosis 06/10/2021   PONV (postoperative nausea and vomiting)    Recurrent upper respiratory infection (URI)    Sebaceous cyst of breast, right lower inner quadrant 10/25/2012   Excised 11/21/12    Sleep apnea    Syncope, non cardiac    Past Surgical History:  Procedure Laterality Date   ARTHROSCOPIC HAGLUNDS REPAIR     BREAST CYST EXCISION Right 11/21/2012   Procedure: CYST EXCISION BREAST;  Surgeon: Sherlean JINNY Laughter, MD;  Location: [facility];  Service: General;  Laterality: Right;   BREAST CYST EXCISION Bilateral 01/18/2022   Procedure: EXCISION OF SEBACEOUS CYST BILATERAL BREAST;  Surgeon: Vanderbilt Ned, MD;  Location: [facility];  Service: General;  Laterality: Bilateral;   BREAST EXCISIONAL  BIOPSY Right 11/2012   CESAREAN SECTION  04/19/1993   CHOLECYSTECTOMY     COLONOSCOPY  05/02/2011   Procedure: COLONOSCOPY;  Surgeon: Lynwood LITTIE Celestia Mickey., MD;  Location: [facility];  Service: Endoscopy;  Laterality: N/A;   colonoscopy  11/04/2014   DENTAL SURGERY  03/06/2018   2 surgeries ( 03/06/2018 and 04/06/2018)   to remove two separate benign tumors   ESOPHAGEAL MANOMETRY N/A 11/04/2015   Procedure: ESOPHAGEAL MANOMETRY (EM);  Surgeon: Lynwood Celestia, MD;  Location: [facility];  Service: Endoscopy;  Laterality: N/A;   ESOPHAGOGASTRODUODENOSCOPY  05/02/2011   Procedure: ESOPHAGOGASTRODUODENOSCOPY (EGD);  Surgeon: Lynwood LITTIE Celestia Mickey., MD;  Location: [facility];  Service: Endoscopy;  Laterality: N/A;   ESOPHAGOGASTRODUODENOSCOPY N/A 09/01/2014   Procedure: ESOPHAGOGASTRODUODENOSCOPY (EGD);  Surgeon: Lynwood Celestia, MD;  Location: [facility];  Service: Endoscopy;  Laterality: N/A;   IR IMAGING GUIDED PORT INSERTION  07/06/2023   KNEE SURGERY     LAPAROSCOPY     x 4   LESION REMOVAL N/A 01/18/2022   Procedure: EXCISION OF SEBACEOUS CYSTS CHEST AND NECK;  Surgeon: Vanderbilt Ned, MD;  Location:  [facility];  Service: General;  Laterality: N/A;   PH IMPEDANCE STUDY N/A 11/04/2015   Procedure: PH IMPEDANCE STUDY;  Surgeon: Lynwood Celestia, MD;  Location: [facility];  Service: Endoscopy;  Laterality: N/A;   VAGINAL HYSTERECTOMY  2010   TVH--ovaries remain   Patient Active Problem List   Diagnosis Date Noted   Sjogren syndrome with keratoconjunctivitis (HCC) 12/09/2022   Other hemochromatosis 06/10/2021   Elevated LFTs 04/04/2021   Colitis 04/04/2021   Bacterial overgrowth syndrome 05/21/2020   Obesity 05/21/2020   History of endometriosis 05/21/2020   Constipation due to outlet dysfunction 02/05/2020   Gastroesophageal reflux disease 02/05/2020   Obstructive sleep apnea treated with continuous positive airway pressure (CPAP) 06/16/2017   Perennial allergic rhinitis  with a nonallergic component 01/31/2017   Asthmatic bronchitis 01/31/2017   GI symptoms 01/31/2017   Family history of colon cancer 01/27/2015   Chest pain 12/13/2012   Dyspnea 12/13/2012   DVT of lower extremity (deep venous thrombosis) (HCC) 01/03/1990    PCP: Gaile New, MD  REFERRING PROVIDER: Valery Ripple, MD  REFERRING DIAG: I89.0  THERAPY DIAG:  Lymphedema, not elsewhere classified  Rationale for Evaluation and Treatment: Rehabilitation  ONSET DATE: ~2014; exacerbation in 2023  SUBJECTIVE:                                                                                                                                                                                          SUBJECTIVE STATEMENT:  Pt presents for OT to address lower quadrant, including deep abdominal lymphatics, and LLE lymphedema w/ associated pain. Pt reports she is having diarrhea and constipation as side effects of Humira . She does not rate pain numerically this afternoon.   PERTINENT HISTORY: CVI, OSA (no CPAP), GERD, Esophageal stricture, IBS, Lactose intolerant, Diarrhea, Hx LE DVT, recent RA dx  PAIN:  Are you having pain? Yes: not rated Pain location: R thigh (quads); LE, digestive discomfort Pain description: myalgia, tight, heavy, tingling, numbness, soreness Aggravating factors: standing, walking, dependent sitting > 30 minutes Relieving factors: elevation  PRECAUTIONS: Other: LYMPHEDEMA; Hx LLE DVT  WEIGHT BEARING RESTRICTIONS: No  FALLS:  Has patient fallen in last 6 months? No  LIVING ENVIRONMENT: Lives with: lives with spouse and daughter Lives in: House/apartment Stairs: Yes; Internal: 14 steps; yes and External: 4 steps; yes Has following equipment at home: None  OCCUPATION: [employer] operational officer, walking, desk work  LEISURE: family time  HAND DOMINANCE: right   PRIOR LEVEL OF FUNCTION: Independent  PATIENT GOALS: Feel better, be able to be more active without  pain, wear preferred street shoes. NEW 08/03/23: Be able to manage digestive discomfort and gut inflammation symptoms without medications  OBJECTIVE: Note: Objective measures were completed at Evaluation unless otherwise noted.  COGNITION:  Overall cognitive status: Within functional limits  for tasks assessed   POSTURE: WFL  LE ROM: WFL  LE MMT: WFL  LYMPHEDEMA ASSESSMENTS: non-Ca related  INFECTIONS: denies cellulitis and wound hx  BLE COMPARATIVE LIMB VOLUMETRICS Initial 11/15/22  LANDMARK RIGHT (dominant)  R LEG (A-D) 3028.9 ml  R THIGH (E-G) 4809.60 ml  R FULL LIMB (A-G) 7838.5 ml  Limb Volume differential (LVD)  %  Volume change since initial %  Volume change overall V  (Blank rows = not tested)  LANDMARK LEFT  (Rx)  L LEG (A-D) 3189.9 ml  L THIGH (E-G) 4959.8 ml  L FULL LIMB (A-G) 8149.7 ml  Limb Volume differential (LVD)  LEG LVD = 5.32%, L>R; THIGH LVD = 3.12%, L>R; And full LLE LVD = 3.97%, L>R.   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  Skin  Description Hyper-Keratosis Peau d' Orange Shiny Tight Fibrotic/ Indurated Fatty Doughy Spongy/ boggy       R>L x  x   Skin dry Flaky WNL Macerated   mildly      Color Redness Varicosities Blanching Hemosiderin Stain Mottled        x   Odor Malodorous Yeast Fungal infection  WNL      x   Temperature Warm Cool wnl     x    Pitting Edema   1+ 2+ 3+ 4+ Non-pitting         x   Girth Symmetrical Asymmetrical                   Distribution    L>R toes to groin    Stemmer Sign Positive Negative   +L -R    Lymphorrhea History Of:  Present Absent     x    Wounds History Of Present Absent Venous Arterial Pressure Sheer     x        Signs of Infection Redness Warmth Erythema Acute Swelling Drainage Borders                    Sensation Light Touch Deep pressure Hypersensitivity   In tact Impaired In tact Impaired Absent Impaired   x  x  x     Nails WNL   Fungus nail dystrophy   x     Hair Growth Symmetrical Asymmetrical   x    Skin Creases Base of toes  Ankles   Base of Fingers knees       Abdominal pannus Thigh Lobules  Face/neck   x           GAIT: Distance walked: >500' Assistive device utilized: None Level of assistance: Complete Independence Comments: Functional ambulation and transfers [facility]  LYMPHEDEMA LIFE IMPACT SCALE (LLIS): Initial 11/15/22  50%  FOTO functional outcome measure: Deferred. FOTO discontinued.  TODAY'S TREATMENT:  MLD to abdominal lymphatics Pt edu for LE self care Pt edu for trunk rotation stretch in side lying (unwinding)  PATIENT EDUCATION:  Continued Pt/ CG edu for how function of cisterna chyli, part of the thoracic duct of deep abdominal lymphatics, assists with digestion by filtering many of the fats and proteins from the digestive system. Sub optimal lymphatic flow in this region may result in constipation, bloating, swelling, etc. MLD helps mobilize these protein and fats , and the rhythmic movement of manual lymphatic drainage stimulates digestive muscles and increase peristalsis. Provided printed resource for reference.  Topics include outcome of comparative limb volumetrics- starting limb volume differentials (LVDs), technology and gradient techniques used for short stretch, multilayer compression wrapping, simple self-MLD, therapeutic lymphatic pumping exercises, skin/nail care, LE precautions,.  compression garment recommendations and specifications, wear and care schedule and compression garment donning / doffing w assistive devices. Discussed progress towards all OT goals since commencing CDT. All questions answered to the Pt's satisfaction. Good return. Person educated: Patient  Education method: Explanation, Demonstration, and Handouts Education comprehension: verbalized understanding, returned demonstration, verbal cues required, and needs further education  HOME EXERCISE PROGRAM: BLE lymphatic pumping there ex using- 1 sets of 10 reps, each exercise in order-  1-2 x daily, bilaterally Simple self MLD 1 x daily Daily skin care to increase hydration, skin mobility and decrease infection risk- can be done during MLD Compression wraps 23/7 during intensive phase of CDT Compression garments/ devices during self-management phase of CDT  ASSESSMENT:  CLINICAL IMPRESSION: Pt tolerated MLD to deep abdominal lymphatics combined with diaphragmatic breathing in supine and side lying today without increased pain.  Finished with stationary circle to descending, transverse and ascending colon.  She continues to  benefit from MLD for enhancing gut motility and bowel function.  MLD is also known to facilitate the removal of inflammatory substances and toxins from the intestinal area, which may help reduce inflammation linked to digestive disorders. Cont  1 x weekly as per POC.  11/15/22 Initial Evaluation: Karen [name] is a 53 y.o. female presenting with very mild, stage II, LLE lymphedema 2/2 venous insufficiency and obesity. L Leg swelling and associated pain swelling fluctuates. It has progressed over time, and now no longer resolves with elevation over night. RLE swelling limits balance during functional ambulation. It exacerbates infection and falls risk. LLE/RLQ lymphedema interferes with functional performance in all occupational domains, including basic and instrumental ADLs, productive  activities, leisure pursuits and social participation. Pt will benefit from Occupational Therapy for Complete Decongestive Therapy (CDT) to restore function, to reduce physical and psychologic suffering associated with chronic, progressive lymphedema and associated pain, and to limit infection. CDT will include manual lymphatic drainage (MLD), skin care, therapeutic exercise and compression wraps and garment's devices. Without skilled OT for lymphedema care the condition will worsen and further functional decline is expected  Custom-made gradient compression garments and HOS devices are medically necessary because they are uniquely sized and shaped to fit the exact dimensions of the affected extremities, and to provide appropriate medical grade, graduated compression essential for optimally managing chronic, progressive lymphedema. Multiple custom compression garments are needed to ensure proper hygiene to limit infection risk. Custom compression garments should be replaced q 3-6 months When worn consistently for optimal lipo-lymphedema self-management over time. HOS devices, medically necessary to limit fibrosis buildup in tissue, should be replaced q 2 years and PRN when worn out.      OBJECTIVE IMPAIRMENTS: decreased activity tolerance, decreased knowledge of condition, decreased knowledge  of use of DME, increased edema, impaired sensation, pain, and chronic, progressive leg swelling.   ACTIVITY LIMITATIONS: dependent sitting, extended standing and/ or walking, squatting, and lower body dressing, fitting preferred street shoes  PARTICIPATION LIMITATIONS: shopping, community activity, occupation, and yard work  PERSONAL FACTORS: 3+ comorbidities: Hx DVT,  OSA, CVI. Varicose veins are also affecting patient's functional outcome.   REHAB POTENTIAL: Good  EVALUATION COMPLEXITY: Moderate   GOALS: Goals reviewed with patient? Yes  SHORT TERM GOALS: Target date: 4th OT Rx visit  SHORT TERM GOALS:  Target date: 4th OT Rx visit   Pt will demonstrate understanding of lymphedema precautions and prevention strategies with modified independence using a printed reference to identify at least 5 precautions and discussing how s/he may implement them into daily life to reduce risk of progression with extra time. Baseline:Max A Goal status: GOAL MET   Pt will be able to apply multilayer, knee length, gradient, compression wraps to one leg at a time with modified assistance (extra time and assistive device/s) to decrease limb volume, to limit infection risk, and to limit lymphedema progression.  Baseline: Dependent Goal status: GOAL DISCHARGED. Pt Going to compression garments instead  LONG TERM GOALS: Target date: 02/08/23  Given this patient's Intake score of 67%/100% on the functional outcomes FOTO tool, patient will experience an increase in function of 5 points to improve basic and instrumental ADLs performance, including lymphedema self-care.  Baseline: Max A Goal status:GOAL DISCHARGED.  FOTO TOOL DISCONTINUED AT CLINIC   Given this patient's Intake score of 50% on the Lymphedema Life Impact Scale (LLIS), patient will experience a reduction of at least 5 points in her perceived level of functional impairment resulting from lymphedema to improve functional performance and quality of life (QOL). Baseline: 50% Goal status: PROGRESSING  Pt will achieve at least a 10% volume reduction in B legs to return limb to typical size and shape, to limit infection risk and LE progression, to decrease pain, to improve function. Baseline: Dependent Goal status: PROGRESSING  4.  Pt will obtain proper compression garments/devices and achieve modified independence (extra time + assistive devices) with donning/doffing to optimize limb volume reductions and limit LE  progression over time. Baseline:  Goal status:GOAL MET  5.  During Intensive phase CDT , with modified independence, Pt will achieve at least 85%  compliance with all lymphedema self-care home program components, including daily skin care, compression wraps and /or garments, simple self MLD and lymphatic pumping therex to habituate LE self care protocol  into ADLs for optimal LE self-management over time. Baseline: Dependent Goal status:GOAL MET   PLAN:  OT FREQUENCY: 1 /week  OT DURATION: 12 weeks and PRN  PLANNED INTERVENTIONS: 97110-Therapeutic exercises, 97530- Therapeutic activity, 97535- Self Care, [id number]- Manual therapy, Patient/Family education, Manual lymph drainage, Compression bandaging, and fit with appropriate compression garments  PLAN FOR NEXT SESSION:  Cont MLD as established Pt and family edu for LE self-care  Zebedee Dec, MS, OTR/L, CLT-LANA 08/25/23 8:11 AM

## 2023-08-25 ENCOUNTER — Ambulatory Visit: Admitting: Physical Therapy

## 2023-08-28 ENCOUNTER — Ambulatory Visit: Admitting: Occupational Therapy

## 2023-08-28 ENCOUNTER — Other Ambulatory Visit (HOSPITAL_COMMUNITY): Payer: Self-pay

## 2023-08-28 DIAGNOSIS — I890 Lymphedema, not elsewhere classified: Secondary | ICD-10-CM

## 2023-08-28 NOTE — Therapy (Signed)
 OUTPATIENT OCCUPATIONAL THERAPY TREATMENT NOTE  BLE/ BLQ LYMPHEDEMA  Patient Name: Karen [name] MRN: [id number] DOB:02-03-1970, 53 y.o., female Today's Date: 08/28/2023  REPORTING PERIOD:  END OF SESSION:   OT End of Session - 08/28/23 1610     Visit Number 51    Number of Visits 72    Date for OT Re-Evaluation 11/16/23    Activity Tolerance Patient tolerated treatment well;No increased pain    Behavior During Therapy [facility] for tasks assessed/performed            Past Medical History:  Diagnosis Date   Adenomyosis    Anemia    Arthritis 2013   L knee gets steroid injections    Arthritis, rheumatic, acute or subacute    Asthmatic bronchitis 01/31/2017   Back pain    Chronic constipation    Diarrhea    DVT of lower extremity (deep venous thrombosis) (HCC)    Dysmenorrhea    Endometriosis    Esophageal stricture    G6PD deficiency    GERD (gastroesophageal reflux disease)    History of kidney stones    History of uterine fibroid    IBS (irritable bowel syndrome)    Interstitial cystitis    Lactose intolerance    Migraine    with aura   Other hemochromatosis 06/10/2021   PONV (postoperative nausea and vomiting)    Recurrent upper respiratory infection (URI)    Sebaceous cyst of breast, right lower inner quadrant 10/25/2012   Excised 11/21/12    Sleep apnea    Syncope, non cardiac    Past Surgical History:  Procedure Laterality Date   ARTHROSCOPIC HAGLUNDS REPAIR     BREAST CYST EXCISION Right 11/21/2012   Procedure: CYST EXCISION BREAST;  Surgeon: Sherlean JINNY Laughter, MD;  Location: [facility];  Service: General;  Laterality: Right;   BREAST CYST EXCISION Bilateral 01/18/2022   Procedure: EXCISION OF SEBACEOUS CYST BILATERAL BREAST;  Surgeon: Vanderbilt Ned, MD;  Location: [facility];  Service: General;  Laterality: Bilateral;   BREAST EXCISIONAL BIOPSY Right 11/2012   CESAREAN SECTION  04/19/1993   CHOLECYSTECTOMY      COLONOSCOPY  05/02/2011   Procedure: COLONOSCOPY;  Surgeon: Lynwood LITTIE Celestia Mickey., MD;  Location: [facility];  Service: Endoscopy;  Laterality: N/A;   colonoscopy  11/04/2014   DENTAL SURGERY  03/06/2018   2 surgeries ( 03/06/2018 and 04/06/2018)   to remove two separate benign tumors   ESOPHAGEAL MANOMETRY N/A 11/04/2015   Procedure: ESOPHAGEAL MANOMETRY (EM);  Surgeon: Lynwood Celestia, MD;  Location: [facility];  Service: Endoscopy;  Laterality: N/A;   ESOPHAGOGASTRODUODENOSCOPY  05/02/2011   Procedure: ESOPHAGOGASTRODUODENOSCOPY (EGD);  Surgeon: Lynwood LITTIE Celestia Mickey., MD;  Location: [facility];  Service: Endoscopy;  Laterality: N/A;   ESOPHAGOGASTRODUODENOSCOPY N/A 09/01/2014   Procedure: ESOPHAGOGASTRODUODENOSCOPY (EGD);  Surgeon: Lynwood Celestia, MD;  Location: [facility];  Service: Endoscopy;  Laterality: N/A;   IR IMAGING GUIDED PORT INSERTION  07/06/2023   KNEE SURGERY     LAPAROSCOPY     x 4   LESION REMOVAL N/A 01/18/2022   Procedure: EXCISION OF SEBACEOUS CYSTS CHEST AND NECK;  Surgeon: Vanderbilt Ned, MD;  Location: [facility];  Service: General;  Laterality: N/A;   PH IMPEDANCE STUDY N/A 11/04/2015   Procedure: PH IMPEDANCE STUDY;  Surgeon: Lynwood Celestia, MD;  Location: [facility];  Service: Endoscopy;  Laterality: N/A;   VAGINAL HYSTERECTOMY  2010   TVH--ovaries remain   Patient Active  Problem List   Diagnosis Date Noted   Sjogren syndrome with keratoconjunctivitis (HCC) 12/09/2022   Other hemochromatosis 06/10/2021   Elevated LFTs 04/04/2021   Colitis 04/04/2021   Bacterial overgrowth syndrome 05/21/2020   Obesity 05/21/2020   History of endometriosis 05/21/2020   Constipation due to outlet dysfunction 02/05/2020   Gastroesophageal reflux disease 02/05/2020   Obstructive sleep apnea treated with continuous positive airway pressure (CPAP) 06/16/2017   Perennial allergic rhinitis with a nonallergic component 01/31/2017   Asthmatic bronchitis 01/31/2017   GI  symptoms 01/31/2017   Family history of colon cancer 01/27/2015   Chest pain 12/13/2012   Dyspnea 12/13/2012   DVT of lower extremity (deep venous thrombosis) (HCC) 01/03/1990    PCP: Gaile New, MD  REFERRING PROVIDER: Valery Ripple, MD  REFERRING DIAG: I89.0  THERAPY DIAG:  Lymphedema, not elsewhere classified  Rationale for Evaluation and Treatment: Rehabilitation  ONSET DATE: ~2014; exacerbation in 2023  SUBJECTIVE:                                                                                                                                                                                          SUBJECTIVE STATEMENT:  Pt presents for OT to address lower quadrant, including deep abdominal lymphatics, and LLE lymphedema w/ associated pain. Pt reports she is having diarrhea and constipation as side effects of Humira . She does not rate pain numerically this afternoon.   PERTINENT HISTORY: CVI, OSA (no CPAP), GERD, Esophageal stricture, IBS, Lactose intolerant, Diarrhea, Hx LE DVT, recent RA dx  PAIN:  Are you having pain? Yes: not rated Pain location: R thigh (quads); LE, digestive discomfort Pain description: myalgia, tight, heavy, tingling, numbness, soreness Aggravating factors: standing, walking, dependent sitting > 30 minutes Relieving factors: elevation  PRECAUTIONS: Other: LYMPHEDEMA; Hx LLE DVT  WEIGHT BEARING RESTRICTIONS: No  FALLS:  Has patient fallen in last 6 months? No  LIVING ENVIRONMENT: Lives with: lives with spouse and daughter Lives in: House/apartment Stairs: Yes; Internal: 14 steps; yes and External: 4 steps; yes Has following equipment at home: None  OCCUPATION: [employer] operational officer, walking, desk work  LEISURE: family time  HAND DOMINANCE: right   PRIOR LEVEL OF FUNCTION: Independent  PATIENT GOALS: Feel better, be able to be more active without pain, wear preferred street shoes. NEW 08/03/23: Be able to manage digestive  discomfort and gut inflammation symptoms without medications  OBJECTIVE: Note: Objective measures were completed at Evaluation unless otherwise noted.  COGNITION:  Overall cognitive status: Within functional limits for tasks assessed   POSTURE: WFL  LE ROM: WFL  LE MMT: WFL  LYMPHEDEMA ASSESSMENTS: non-Ca related  INFECTIONS: denies  cellulitis and wound hx  BLE COMPARATIVE LIMB VOLUMETRICS Initial 11/15/22  LANDMARK RIGHT (dominant)  R LEG (A-D) 3028.9 ml  R THIGH (E-G) 4809.60 ml  R FULL LIMB (A-G) 7838.5 ml  Limb Volume differential (LVD)  %  Volume change since initial %  Volume change overall V  (Blank rows = not tested)  LANDMARK LEFT  (Rx)  L LEG (A-D) 3189.9 ml  L THIGH (E-G) 4959.8 ml  L FULL LIMB (A-G) 8149.7 ml  Limb Volume differential (LVD)  LEG LVD = 5.32%, L>R; THIGH LVD = 3.12%, L>R; And full LLE LVD = 3.97%, L>R.   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  Skin  Description Hyper-Keratosis Peau d' Orange Shiny Tight Fibrotic/ Indurated Fatty Doughy Spongy/ boggy       R>L x  x   Skin dry Flaky WNL Macerated   mildly      Color Redness Varicosities Blanching Hemosiderin Stain Mottled        x   Odor Malodorous Yeast Fungal infection  WNL      x   Temperature Warm Cool wnl     x    Pitting Edema   1+ 2+ 3+ 4+ Non-pitting         x   Girth Symmetrical Asymmetrical                   Distribution    L>R toes to groin    Stemmer Sign Positive Negative   +L -R   Lymphorrhea History Of:  Present Absent     x    Wounds History Of  Present Absent Venous Arterial Pressure Sheer     x        Signs of Infection Redness Warmth Erythema Acute Swelling Drainage Borders                    Sensation Light Touch Deep pressure Hypersensitivity   In tact Impaired In tact Impaired Absent Impaired   x  x  x     Nails WNL   Fungus nail dystrophy   x     Hair Growth Symmetrical Asymmetrical   x    Skin Creases Base of toes  Ankles   Base of Fingers knees       Abdominal pannus Thigh Lobules  Face/neck   x           GAIT: Distance walked: >500' Assistive device utilized: None Level of assistance: Complete Independence Comments: Functional ambulation and transfers [facility]  LYMPHEDEMA LIFE IMPACT SCALE (LLIS): Initial 11/15/22  50%  FOTO functional outcome measure: Deferred. FOTO discontinued.  TODAY'S TREATMENT:  MLD to abdominal lymphatics Pt edu for LE self care Pt edu for trunk rotation stretch in side lying (unwinding)  PATIENT EDUCATION:  Continued Pt/ CG edu for how function of cisterna chyli, part of the thoracic duct of deep abdominal lymphatics, assists with digestion by filtering many of the fats and proteins from the digestive system. Sub optimal lymphatic flow in this region may result in constipation, bloating, swelling, etc. MLD helps mobilize these protein and fats , and the rhythmic movement of manual lymphatic drainage stimulates digestive muscles and increase peristalsis. Provided printed resource for reference.  Topics include outcome of comparative limb volumetrics- starting limb volume differentials (LVDs), technology and gradient techniques used for short stretch, multilayer compression wrapping, simple self-MLD, therapeutic lymphatic pumping exercises, skin/nail care, LE precautions,. compression garment recommendations and specifications, wear and care schedule  and compression garment donning / doffing w assistive devices. Discussed progress towards all OT goals since commencing CDT. All questions answered to the Pt's satisfaction. Good return. Person educated: Patient  Education method: Explanation, Demonstration, and Handouts Education comprehension: verbalized understanding, returned demonstration, verbal cues required, and needs further education  HOME EXERCISE PROGRAM: BLE lymphatic pumping there ex using- 1 sets of 10 reps, each exercise in order-  1-2 x daily, bilaterally Simple self MLD 1 x daily Daily skin care to increase hydration, skin mobility and decrease infection risk- can be done during MLD Compression wraps 23/7 during intensive phase of CDT Compression garments/ devices during self-management phase of CDT  ASSESSMENT:  CLINICAL IMPRESSION: Pt tolerated MLD to deep abdominal lymphatics combined with diaphragmatic breathing in supine and side lying today without increased pain.  Finished with stationary circle to descending, transverse and ascending colon.  She continues to  benefit from MLD for enhancing gut motility and bowel function.  MLD is also known to facilitate the removal of inflammatory substances and toxins from the intestinal area, which may help reduce inflammation linked to digestive disorders. Cont  1 x weekly as per POC.  11/15/22 Initial Evaluation: Karen [name] is a 53 y.o. female presenting with very mild, stage II, LLE lymphedema 2/2 venous insufficiency and obesity. L Leg swelling and associated pain swelling fluctuates. It has progressed over time, and now no longer resolves with elevation over night. RLE swelling limits balance during functional ambulation. It exacerbates infection and falls risk. LLE/RLQ lymphedema interferes with functional performance in all occupational domains, including basic and instrumental ADLs, productive activities, leisure pursuits and social participation. Pt will benefit from  Occupational Therapy for Complete Decongestive Therapy (CDT) to restore function, to reduce physical and psychologic suffering associated with chronic, progressive lymphedema and associated pain, and to limit infection. CDT will include manual lymphatic drainage (MLD), skin care, therapeutic exercise and compression wraps and garment's devices. Without skilled OT for lymphedema care the condition will worsen and further functional decline is expected  Custom-made gradient compression garments and HOS devices are medically necessary because they are uniquely sized and shaped to fit the exact dimensions of the affected extremities, and to provide appropriate medical grade, graduated compression essential for optimally managing chronic, progressive lymphedema. Multiple custom compression garments are needed to ensure proper hygiene to limit infection risk. Custom compression garments should be replaced q 3-6 months When worn consistently for optimal lipo-lymphedema self-management over time. HOS devices, medically necessary to limit fibrosis buildup in tissue, should be replaced q 2 years and PRN when worn out.      OBJECTIVE IMPAIRMENTS: decreased activity tolerance, decreased knowledge of condition, decreased knowledge  of use of DME, increased edema, impaired sensation, pain, and chronic, progressive leg swelling.   ACTIVITY LIMITATIONS: dependent sitting, extended standing and/ or walking, squatting, and lower body dressing, fitting preferred street shoes  PARTICIPATION LIMITATIONS: shopping, community activity, occupation, and yard work  PERSONAL FACTORS: 3+ comorbidities: Hx DVT,  OSA, CVI. Varicose veins are also affecting patient's functional outcome.   REHAB POTENTIAL: Good  EVALUATION COMPLEXITY: Moderate   GOALS: Goals reviewed with patient? Yes  SHORT TERM GOALS: Target date: 4th OT Rx visit  SHORT TERM GOALS: Target date: 4th OT Rx visit   Pt will demonstrate understanding of  lymphedema precautions and prevention strategies with modified independence using a printed reference to identify at least 5 precautions and discussing how s/he may implement them into daily life to reduce risk of progression with extra time. Baseline:Max A Goal status: GOAL MET   Pt will be able to apply multilayer, knee length, gradient, compression wraps to one leg at a time with modified assistance (extra time and assistive device/s) to decrease limb volume, to limit infection risk, and to limit lymphedema progression.  Baseline: Dependent Goal status: GOAL DISCHARGED. Pt Going to compression garments instead  LONG TERM GOALS: Target date: 02/08/23  Given this patient's Intake score of 67%/100% on the functional outcomes FOTO tool, patient will experience an increase in function of 5 points to improve basic and instrumental ADLs performance, including lymphedema self-care.  Baseline: Max A Goal status:GOAL DISCHARGED.  FOTO TOOL DISCONTINUED AT CLINIC   Given this patient's Intake score of 50% on the Lymphedema Life Impact Scale (LLIS), patient will experience a reduction of at least 5 points in her perceived level of functional impairment resulting from lymphedema to improve functional performance and quality of life (QOL). Baseline: 50% Goal status: PROGRESSING  Pt will achieve at least a 10% volume reduction in B legs to return limb to typical size and shape, to limit infection risk and LE progression, to decrease pain, to improve function. Baseline: Dependent Goal status: PROGRESSING  4.  Pt will obtain proper compression garments/devices and achieve modified independence (extra time + assistive devices) with donning/doffing to optimize limb volume reductions and limit LE  progression over time. Baseline:  Goal status:GOAL MET  5.  During Intensive phase CDT , with modified independence, Pt will achieve at least 85% compliance with all lymphedema self-care home program components,  including daily skin care, compression wraps and /or garments, simple self MLD and lymphatic pumping therex to habituate LE self care protocol  into ADLs for optimal LE self-management over time. Baseline: Dependent Goal status:GOAL MET   PLAN:  OT FREQUENCY: 1 /week  OT DURATION: 12 weeks and PRN  PLANNED INTERVENTIONS: 97110-Therapeutic exercises, 97530- Therapeutic activity, 97535- Self Care, [id number]- Manual therapy, Patient/Family education, Manual lymph drainage, Compression bandaging, and fit with appropriate compression garments  PLAN FOR NEXT SESSION:  Cont MLD as established Pt and family edu for LE self-care  Zebedee Dec, MS, OTR/L, CLT-LANA 08/28/23 4:11 PM

## 2023-08-29 ENCOUNTER — Ambulatory Visit: Admitting: Physical Therapy

## 2023-08-29 ENCOUNTER — Encounter

## 2023-08-29 DIAGNOSIS — M542 Cervicalgia: Secondary | ICD-10-CM

## 2023-08-29 DIAGNOSIS — S46812A Strain of other muscles, fascia and tendons at shoulder and upper arm level, left arm, initial encounter: Secondary | ICD-10-CM

## 2023-08-29 DIAGNOSIS — M6281 Muscle weakness (generalized): Secondary | ICD-10-CM

## 2023-08-29 DIAGNOSIS — M62838 Other muscle spasm: Secondary | ICD-10-CM | POA: Diagnosis not present

## 2023-08-29 DIAGNOSIS — S29012A Strain of muscle and tendon of back wall of thorax, initial encounter: Secondary | ICD-10-CM | POA: Diagnosis not present

## 2023-08-29 NOTE — Therapy (Signed)
 OUTPATIENT PHYSICAL THERAPY CERVICAL TREATMENT   Patient Name: Karen [name] MRN: [id number] DOB:19-Mar-1970, 53 y.o., female Today's Date: 08/29/2023  END OF SESSION:  PT End of Session - 08/29/23 1152     Visit Number 5   ortho count 5   Date for PT Re-Evaluation 10/03/23    Authorization Type [name]    Authorization Time Period signed dry needling consent form 08/08/23    PT Start Time 1152    PT Stop Time 1230    PT Time Calculation (min) 38 min    Activity Tolerance Patient tolerated treatment well             Past Medical History:  Diagnosis Date   Adenomyosis    Anemia    Arthritis 2013   L knee gets steroid injections    Arthritis, rheumatic, acute or subacute    Asthmatic bronchitis 01/31/2017   Back pain    Chronic constipation    Diarrhea    DVT of lower extremity (deep venous thrombosis) (HCC)    Dysmenorrhea    Endometriosis    Esophageal stricture    G6PD deficiency    GERD (gastroesophageal reflux disease)    History of kidney stones    History of uterine fibroid    IBS (irritable bowel syndrome)    Interstitial cystitis    Lactose intolerance    Migraine    with aura   Other hemochromatosis 06/10/2021   PONV (postoperative nausea and vomiting)    Recurrent upper respiratory infection (URI)    Sebaceous cyst of breast, right lower inner quadrant 10/25/2012   Excised 11/21/12    Sleep apnea    Syncope, non cardiac    Past Surgical History:  Procedure Laterality Date   ARTHROSCOPIC HAGLUNDS REPAIR     BREAST CYST EXCISION Right 11/21/2012   Procedure: CYST EXCISION BREAST;  Surgeon: Sherlean JINNY Laughter, MD;  Location: [facility];  Service: General;  Laterality: Right;   BREAST CYST EXCISION Bilateral 01/18/2022   Procedure: EXCISION OF SEBACEOUS CYST BILATERAL BREAST;  Surgeon: Vanderbilt Ned, MD;  Location: [facility];  Service: General;  Laterality: Bilateral;   BREAST EXCISIONAL BIOPSY Right  11/2012   CESAREAN SECTION  04/19/1993   CHOLECYSTECTOMY     COLONOSCOPY  05/02/2011   Procedure: COLONOSCOPY;  Surgeon: Lynwood LITTIE Celestia Mickey., MD;  Location: [facility];  Service: Endoscopy;  Laterality: N/A;   colonoscopy  11/04/2014   DENTAL SURGERY  03/06/2018   2 surgeries ( 03/06/2018 and 04/06/2018)   to remove two separate benign tumors   ESOPHAGEAL MANOMETRY N/A 11/04/2015   Procedure: ESOPHAGEAL MANOMETRY (EM);  Surgeon: Lynwood Celestia, MD;  Location: [facility];  Service: Endoscopy;  Laterality: N/A;   ESOPHAGOGASTRODUODENOSCOPY  05/02/2011   Procedure: ESOPHAGOGASTRODUODENOSCOPY (EGD);  Surgeon: Lynwood LITTIE Celestia Mickey., MD;  Location: [facility];  Service: Endoscopy;  Laterality: N/A;   ESOPHAGOGASTRODUODENOSCOPY N/A 09/01/2014   Procedure: ESOPHAGOGASTRODUODENOSCOPY (EGD);  Surgeon: Lynwood Celestia, MD;  Location: [facility];  Service: Endoscopy;  Laterality: N/A;   IR IMAGING GUIDED PORT INSERTION  07/06/2023   KNEE SURGERY     LAPAROSCOPY     x 4   LESION REMOVAL N/A 01/18/2022   Procedure: EXCISION OF SEBACEOUS CYSTS CHEST AND NECK;  Surgeon: Vanderbilt Ned, MD;  Location: [facility];  Service: General;  Laterality: N/A;   PH IMPEDANCE STUDY N/A 11/04/2015   Procedure: PH IMPEDANCE STUDY;  Surgeon: Lynwood Celestia, MD;  Location:  [facility];  Service: Endoscopy;  Laterality: N/A;   VAGINAL HYSTERECTOMY  2010   TVH--ovaries remain   Patient Active Problem List   Diagnosis Date Noted   Sjogren syndrome with keratoconjunctivitis (HCC) 12/09/2022   Other hemochromatosis 06/10/2021   Elevated LFTs 04/04/2021   Colitis 04/04/2021   Bacterial overgrowth syndrome 05/21/2020   Obesity 05/21/2020   History of endometriosis 05/21/2020   Constipation due to outlet dysfunction 02/05/2020   Gastroesophageal reflux disease 02/05/2020   Obstructive sleep apnea treated with continuous positive airway pressure (CPAP) 06/16/2017   Perennial allergic rhinitis with a  nonallergic component 01/31/2017   Asthmatic bronchitis 01/31/2017   GI symptoms 01/31/2017   Family history of colon cancer 01/27/2015   Chest pain 12/13/2012   Dyspnea 12/13/2012   DVT of lower extremity (deep venous thrombosis) (HCC) 01/03/1990    PCP: Elliot Charm, MD   REFERRING PROVIDER: Huey Redell Fallow, PA-C  REFERRING DIAG: M54.2 (ICD-10-CM) - Cervicalgia  THERAPY DIAG:  Cervicalgia  Muscle weakness (generalized)  Other muscle spasm  Strain of left trapezius muscle, initial encounter  Rationale for Evaluation and Treatment: Rehabilitation  ONSET DATE: July 2025  SUBJECTIVE:                                                                                                                                                                                                         SUBJECTIVE STATEMENT: I've got a lot going on getting adjusting to new medication.  Did a short walk and felt it left upper trap/bend of the neck.  Reports soreness generally after last session.  Sensitive skin to tapes   Eval: Reports onset of L UT pain following port placement into R shoulder Hand dominance: Right  PERTINENT HISTORY:  07/21/2023 Assessment: - suspect left-sided cervical/ trapezius muscle spasm - reassured by absence of red flag signs/ symptoms at this time   Concurrent lymphedema PAIN: 08/29/23 Are you having pain? Yes: NPRS scale: 5/10 Pain location: L UT Pain description: ache, cramp Aggravating factors: activity Relieving factors: heat, medication  PRECAUTIONS: None  RED FLAGS: None     WEIGHT BEARING RESTRICTIONS: No  FALLS:  Has patient fallen in last 6 months? No  OCCUPATION: [employer] Employee  PLOF: Independent  PATIENT GOALS: To manage my neck pain  NEXT MD VISIT: TBD  OBJECTIVE:  Note: Objective measures were completed at Evaluation unless otherwise noted.  DIAGNOSTIC FINDINGS:  none  PATIENT SURVEYS:  NDI: 28/50 56%  perceived disability  Minimum Detectable Change (90% confidence): 5 points or 10% points   POSTURE:  rounded shoulders and forward head  PALPATION: Global tenderness throughout B UT and posterior shoulder girdle   CERVICAL ROM:   Active ROM A/PROM (deg) eval  Flexion 75%P!  Extension 75%P!  Right lateral flexion 75%P!  Left lateral flexion 75%P!  Right rotation 75%P!  Left rotation 75%P!   (Blank rows = not tested)  UPPER EXTREMITY ROM:  Active ROM Right eval Left eval  Shoulder flexion 150 150  Shoulder extension    Shoulder abduction 150 150  Shoulder adduction    Shoulder extension    Shoulder internal rotation    Shoulder external rotation    Elbow flexion    Elbow extension    Wrist flexion    Wrist extension    Wrist ulnar deviation    Wrist radial deviation    Wrist pronation    Wrist supination     (Blank rows = not tested)  UPPER EXTREMITY MMT:  MMT Right eval Left eval  Shoulder flexion    Shoulder extension    Shoulder abduction    Shoulder adduction    Shoulder extension    Shoulder internal rotation    Shoulder external rotation    Middle trapezius    Lower trapezius    Elbow flexion    Elbow extension    Wrist flexion    Wrist extension    Wrist ulnar deviation    Wrist radial deviation    Wrist pronation    Wrist supination    Grip strength     (Blank rows = not tested)  CERVICAL SPECIAL TESTS:  Neck flexor muscle endurance test: Negative  FUNCTIONAL TESTS:  N/A  TREATMENT                     08/29/2023 Discussion of current status and response to treatment  Manual therapy: supine soft tissue mobilization to left upper trap, levator scap, left cervical paraspinals and left subocciptals Education on benefits of meditation for muscle relaxation as well as deep breathing Awareness of shoulder hiking as a stress response Use of Theracane for trigger point release to left upper trap and levator Back to wall with lacrosse ball  pinned to the wall:  cervical rotation right/left, rotation/lateral flexion, left UE elevation, left horizontal adduction 5x each                                                                                                      DATE: 08/23/2023 Review of chin tuck performance Seated upper trap stretch 2 x 20 seconds bil  Standing scap squeeze with small pool noodle x 12 Standing with small pool noodle between shoulder blades + shoulder ER with red TB x 12 Standing with small pool noodle between shoulder blades + shoulder abduction with red TB x 12 Standing shoulder row & extension  with red TB 2 x 10 Cervical Melt method with foam roll (flexion/extension and rotation) x 20 each direction Supine LTR x 8 each side 5 sec hold Hooklying TA activation 2 x 10 Hooklying TA + ball squeeze 2 x 10 TA activation +  SLR x 8 bilateral  Manual: therapy rolling to left quads and adductors for improved muscle elongation and decreased cramps Demonstrated standing and sitting quad and hamstring stretch for patient to perform after her walks    08/16/2023 Updated on status and Pain Seated scap squeeze x 12 Seated upper trap stretch 2 x 30 seconds bil (verbal cues for correct performance) Seated levator stretch 2 x 20 seconds bil Seated chin tucks 2 x 10 Standing shoulder row & extension  with red TB 2 x 10 Standing shoulder abduction with red TB 2 x 10 Open Books at wall x 5 each direction Blue stability ball stretch (forwards) x 8 Manual: STM to bilateral upper trap and cervical paraspinals for decreased pain and improved muscle length   08/08/23 Trigger Point Dry Needling  Initial Treatment: Pt instructed on Dry Needling rational, procedures, and possible side effects. Pt instructed to expect mild to moderate muscle soreness later in the day and/or into the next day.  Pt instructed in methods to reduce muscle soreness. Pt instructed to continue prescribed HEP. Patient was educated on signs and  symptoms of infection and other risk factors and advised to seek medical attention should they occur.  Patient verbalized understanding of these instructions and education.   Patient Verbal Consent Given: Yes Education Handout Provided: Yes Muscles Treated: Bil upper traps and levator scapulae  Electrical Stimulation Performed: No Treatment Response/Outcome: significant twitch response and palpable improvement in muscle restriction and trigger points; painful response from patient  Manual: Soft tissue mobilization to bil upper traps, cervical paraspinals, levator, and suboccipitals in prone  Exercises: Seated scapular retraction 10x Seated shoulder rolls 10x Seated shoulder shrugs 10x Seated upper trap stretch 2 x 30 seconds bil Seated levator stretch 2 x 20 seconds bil Therapeutic activities: Self massage with tennis ball in pillow case leaning up against wall to help reduce scapular restriction   Eval and HEP Self Care: Additional minutes spent for educating on updated Therapeutic Home Exercise Program as well as comparing current status to condition at start of symptoms. This included exercises focusing on stretching, strengthening, with focus on eccentric aspects. Long term goals include an improvement in range of motion, strength, endurance as well as avoiding reinjury. Patient's frequency would include in 1-2 times a day, 3-5 times a week for a duration of 6-12 weeks. Proper technique shown and discussed handout in great detail. All questions were discussed and addressed.      PATIENT EDUCATION:  Education details: Discussed eval findings, rehab rationale and POC and patient is in agreement  Person educated: Patient Education method: Explanation and Handouts Education comprehension: verbalized understanding and needs further education  HOME EXERCISE PROGRAM: Access Code: [email or web address] URL: [email or web address] Date: 08/23/2023 Prepared by: Kristeen Sar  Exercises - Seated Shoulder Shrugs  - 2 x daily - 5 x weekly - 2 sets - 10 reps - Seated Scapular Retraction  - 2 x daily - 5 x weekly - 2 sets - 10 reps - Gentle Levator Scapulae Stretch  - 1 x daily - 7 x weekly - 1 sets - 2 reps - 30 seconds hold - Seated Cervical Sidebending Stretch  - 1 x daily - 7 x weekly - 1 sets - 2 reps - 30 seconds hold - Quadricep Stretch with Chair and Counter Support  - 1 x daily - 7 x weekly - 2 sets - 20-30s hold - Standing Hamstring Stretch with Step  - 1 x daily - 7 x weekly - 2 sets -  20-30s hold ASSESSMENT:  CLINICAL IMPRESSION: Trigger points identified in left upper trap and levator scap muscles.  The patient responded well to manual therapy to stimulate underlying myofascial trigger points and muscular tissue for management of neuromusculoskeletal pain and address movement impairments.  Much improved soft tissue mobility and decreased tender point size and number following treatment session.   The patient was encouraged in regular performance of HEP including soft tissue lengthening exercises to enhance long term benefit as well as numerous tools for home for trigger point release. Therapist monitoring response to all interventions and modifying treatment accordingly.    OBJECTIVE IMPAIRMENTS: decreased activity tolerance, decreased mobility, decreased ROM, increased fascial restrictions, increased muscle spasms, impaired UE functional use, postural dysfunction, and pain.   ACTIVITY LIMITATIONS: carrying, lifting, bending, bed mobility, and reach over head  PERSONAL FACTORS: Age, Fitness, Past/current experiences, and 1 comorbidity: lymphedema are also affecting patient's functional outcome.   REHAB POTENTIAL: Good  CLINICAL DECISION MAKING: Stable/uncomplicated  EVALUATION COMPLEXITY: Low   GOALS: Goals reviewed with patient? No    SHORT TERM GOALS=LONG TERM GOALS: Target date: 10/03/23  Patient to demonstrate independence in HEP   Baseline: [email or web address] Goal status: INITIAL  2.  Patient will acknowledge 4/10 pain at least once during episode of care   Baseline: 8/10 Goal status: INITIAL  3.  Patient will score at least 20/50 on NDI to signify clinically meaningful improvement in functional abilities.   Baseline: 28/50 Goal status: INITIAL  4.  160d AROM in B shoulder flexion/abduction Baseline: 150d Goal status: INITIAL  5.  75% AROM cervical spine with minimal pain Baseline: 75% with moderate pain Goal status: INITIAL   PLAN:  PT FREQUENCY: 1-2x/week  PT DURATION: 6 weeks  PLANNED INTERVENTIONS: 97110-Therapeutic exercises, 97530- Therapeutic activity, 97112- Neuromuscular re-education, 97535- Self Care, and [id number]- Manual therapy  PLAN FOR NEXT SESSION:  MELT method with foam roll; postural strengthening; manual techniques as appropriate, aerobic tasks, ROM and flexibility activities, strengthening and PREs, TPDN  Glade Pesa, PT 08/29/23 10:03 PM Phone: [phone] Fax: [phone]

## 2023-08-30 ENCOUNTER — Ambulatory Visit: Payer: Self-pay

## 2023-08-30 ENCOUNTER — Encounter: Admitting: Occupational Therapy

## 2023-08-30 DIAGNOSIS — R279 Unspecified lack of coordination: Secondary | ICD-10-CM

## 2023-08-30 DIAGNOSIS — R102 Pelvic and perineal pain: Secondary | ICD-10-CM

## 2023-08-30 DIAGNOSIS — M62838 Other muscle spasm: Secondary | ICD-10-CM | POA: Diagnosis not present

## 2023-08-30 DIAGNOSIS — M6281 Muscle weakness (generalized): Secondary | ICD-10-CM | POA: Diagnosis not present

## 2023-08-30 DIAGNOSIS — M5459 Other low back pain: Secondary | ICD-10-CM

## 2023-08-30 NOTE — Therapy (Signed)
 OUTPATIENT PHYSICAL THERAPY FEMALE PELVIC TREATMENT   Patient Name: Karen [name] MRN: [id number] DOB:04/21/1970, 53 y.o., female Today's Date: 08/30/2023  END OF SESSION:  PT End of Session - 08/30/23 1402     Visit Number 6    Date for PT Re-Evaluation 10/03/23    Authorization Type [name]    PT Start Time 1400    PT Stop Time 1440    PT Time Calculation (min) 40 min    Activity Tolerance Patient tolerated treatment well    Behavior During Therapy [facility] for tasks assessed/performed                     Past Medical History:  Diagnosis Date   Adenomyosis    Anemia    Arthritis 2013   L knee gets steroid injections    Arthritis, rheumatic, acute or subacute    Asthmatic bronchitis 01/31/2017   Back pain    Chronic constipation    Diarrhea    DVT of lower extremity (deep venous thrombosis) (HCC)    Dysmenorrhea    Endometriosis    Esophageal stricture    G6PD deficiency    GERD (gastroesophageal reflux disease)    History of kidney stones    History of uterine fibroid    IBS (irritable bowel syndrome)    Interstitial cystitis    Lactose intolerance    Migraine    with aura   Other hemochromatosis 06/10/2021   PONV (postoperative nausea and vomiting)    Recurrent upper respiratory infection (URI)    Sebaceous cyst of breast, right lower inner quadrant 10/25/2012   Excised 11/21/12    Sleep apnea    Syncope, non cardiac    Past Surgical History:  Procedure Laterality Date   ARTHROSCOPIC HAGLUNDS REPAIR     BREAST CYST EXCISION Right 11/21/2012   Procedure: CYST EXCISION BREAST;  Surgeon: Sherlean JINNY Laughter, MD;  Location: [facility];  Service: General;  Laterality: Right;   BREAST CYST EXCISION Bilateral 01/18/2022   Procedure: EXCISION OF SEBACEOUS CYST BILATERAL BREAST;  Surgeon: Vanderbilt Ned, MD;  Location: [facility];  Service: General;  Laterality: Bilateral;   BREAST EXCISIONAL BIOPSY Right  11/2012   CESAREAN SECTION  04/19/1993   CHOLECYSTECTOMY     COLONOSCOPY  05/02/2011   Procedure: COLONOSCOPY;  Surgeon: Lynwood LITTIE Celestia Mickey., MD;  Location: [facility];  Service: Endoscopy;  Laterality: N/A;   colonoscopy  11/04/2014   DENTAL SURGERY  03/06/2018   2 surgeries ( 03/06/2018 and 04/06/2018)   to remove two separate benign tumors   ESOPHAGEAL MANOMETRY N/A 11/04/2015   Procedure: ESOPHAGEAL MANOMETRY (EM);  Surgeon: Lynwood Celestia, MD;  Location: [facility];  Service: Endoscopy;  Laterality: N/A;   ESOPHAGOGASTRODUODENOSCOPY  05/02/2011   Procedure: ESOPHAGOGASTRODUODENOSCOPY (EGD);  Surgeon: Lynwood LITTIE Celestia Mickey., MD;  Location: [facility];  Service: Endoscopy;  Laterality: N/A;   ESOPHAGOGASTRODUODENOSCOPY N/A 09/01/2014   Procedure: ESOPHAGOGASTRODUODENOSCOPY (EGD);  Surgeon: Lynwood Celestia, MD;  Location: [facility];  Service: Endoscopy;  Laterality: N/A;   IR IMAGING GUIDED PORT INSERTION  07/06/2023   KNEE SURGERY     LAPAROSCOPY     x 4   LESION REMOVAL N/A 01/18/2022   Procedure: EXCISION OF SEBACEOUS CYSTS CHEST AND NECK;  Surgeon: Vanderbilt Ned, MD;  Location:  S[facility]ERY [facility];  Service: General;  Laterality: N/A;   PH IMPEDANCE STUDY N/A 11/04/2015   Procedure: PH IMPEDANCE STUDY;  Surgeon: Lynwood Celestia,  MD;  Location: [facility];  Service: Endoscopy;  Laterality: N/A;   VAGINAL HYSTERECTOMY  2010   TVH--ovaries remain   Patient Active Problem List   Diagnosis Date Noted   Sjogren syndrome with keratoconjunctivitis (HCC) 12/09/2022   Other hemochromatosis 06/10/2021   Elevated LFTs 04/04/2021   Colitis 04/04/2021   Bacterial overgrowth syndrome 05/21/2020   Obesity 05/21/2020   History of endometriosis 05/21/2020   Constipation due to outlet dysfunction 02/05/2020   Gastroesophageal reflux disease 02/05/2020   Obstructive sleep apnea treated with continuous positive airway pressure (CPAP) 06/16/2017   Perennial allergic rhinitis with a  nonallergic component 01/31/2017   Asthmatic bronchitis 01/31/2017   GI symptoms 01/31/2017   Family history of colon cancer 01/27/2015   Chest pain 12/13/2012   Dyspnea 12/13/2012   DVT of lower extremity (deep venous thrombosis) (HCC) 01/03/1990    PCP: Elliot Charm, MD  REFERRING PROVIDER: Cleotilde Ronal RAMAN, MD   REFERRING DIAG: M62.89 (ICD-10-CM) - Pelvic floor dysfunction  THERAPY DIAG:  Muscle weakness (generalized)  Other muscle spasm  Other low back pain  Pelvic pain  Unspecified lack of coordination  Rationale for Evaluation and Treatment: Rehabilitation  ONSET DATE: chronic  SUBJECTIVE:                                                                                                                                                                                           SUBJECTIVE STATEMENT:  Pt states that she is back on regular Miralax  and having diarrhea due to this. She is not taking a fiber supplement, but just is attempting to supplement through her food. She feels like her bowel movements are not as productive as they were prior to the cleanse. She is consistently having Bristol Stool Scale 6-7.   PAIN: 08/30/23 Are you having pain? Yes NPRS scale: 4/10 Pain location: Lower abdominal pain, Lt sided pain, low back pain  Pain type: turning, knotted, pressure Pain description: intermittent and constant   Aggravating factors: constipation or frequent bowel movements/constantly having bowel movements  Relieving factors: exercises (bowel massage, happy baby, spinal twists, walking)  PRECAUTIONS: None  WEIGHT BEARING RESTRICTIONS: No  FALLS:  Has patient fallen in last 6 months? No  LIVING ENVIRONMENT: Lives with: lives with their spouse Lives in: House/apartment  OCCUPATION: nurse, in admin  PLOF: Independent  PATIENT GOALS: decrease pain and have more normal bowel movements  PERTINENT HISTORY:  Vaginal hysterectomy, DVT LE,  endometriosis, interstitial cystitis, exploratory laps for endometriosis, c-section, cholecystectomy, RA diagnosed 06/2023 Sexual abuse: No  BOWEL MOVEMENT: Pain with bowel movement: Yes Type of bowel movement:Type (Bristol Stool Scale) 1-7 (sometimes  full range in the same bowel movement), Frequency sometimes many times a day, sometimes up to 4 days in between, and Strain Yes Fully empty rectum: No Leakage: No Pads: No Fiber supplement: No - has attempted metamucil and it made constipation worse  URINATION: Pain with urination: Yes Fully empty bladder: No Stream: varies  Urgency: Yes: all the time Frequency: multiple times an hours  Leakage: Urge to void, Walking to the bathroom, Coughing, Sneezing, Laughing, and Exercise Pads: Yes: daily  INTERCOURSE: Pain with intercourse: Initial Penetration and During Penetration Ability to have vaginal penetration:  Yes: with pain Climax: non painful WNL   PREGNANCY: Vaginal deliveries 1 Tearing Yes: 3rd degree tear C-section deliveries 1 Currently pregnant No  PROLAPSE: Periodically will feel vaginal bulging, heaviness in lower abdomen   OBJECTIVE:  05/04/23:               Internal Pelvic Floor: mild tenderness in bil levator ani, but no increase in tension; with internal rectal exam, she was demonstrating paradoxical contraction when trying to eccentrically lengthen with exhale and had very hard time correcting  Patient confirms identification and approves PT to assess internal pelvic floor and treatment Yes  PELVIC MMT:   MMT eval  Vaginal 2/5, 3 second hold, 3 repeat contractions  Internal Anal Sphincter 1/5  External Anal Sphincter 2/5  Puborectalis 1/5  Diastasis Recti   (Blank rows = not tested)        TONE: WNL   PROLAPSE: Grade 2 anterior vaginal wall laxity  04/25/23: Curl-up test: abdominal distortion Transversus abdominus: week, difficulty getting active contraction (not been working on  strengthening)    LUMBARAROM/PROM:  A/PROM A/PROM  Eval (% available) 11/09/22 (% available) 04/25/23 (% available)  Flexion 50 90 90  Extension 25, pain anteriorly  25, pain in low back 50  Right lateral flexion 50, pain anteriorly  75, pain on right 50, pain on Rt  Left lateral flexion 50, pain anteriorly  75, pain on right 75   (Blank rows = not tested)  11/09/22: No internal rectal or vaginal pelvic floor exam performed due to high level of skin irritation   Weak transversus abdominus contraction  Abdominal restriction and tenderness in bil lower quadrant  Unable to perform pelvic tilt with appropriate coordination  LUMBARAROM/PROM:  A/PROM A/PROM  Eval (% available) 11/09/22 (% available)  Flexion 50 90  Extension 25, pain anteriorly  25, pain in low back  Right lateral flexion 50, pain anteriorly  75, pain on right  Left lateral flexion 50, pain anteriorly  75, pain on right   (Blank rows = not tested) 07/21/22: standing prolapse assessment demonstrated grade 2 anterior vaginal wall laxity   06/08/22:               External Perineal Exam: WNL                              Internal Pelvic Floor: burning reported with palpation of superficial muscles; deep aching/pressure with palpation of Lt levator ani   Patient confirms identification and approves PT to assess internal pelvic floor and treatment Yes  PELVIC MMT:   MMT eval  Vaginal 3/5  Internal Anal Sphincter 1/5  External Anal Sphincter 2/5  Puborectalis 1/5  Diastasis Recti   (Blank rows = not tested)        TONE: High in Lt levator ani   PROLAPSE: Not able to tell this session  due to dyssynergic pelvic floor contraction     06/01/22: COGNITION: Overall cognitive status: Within functional limits for tasks assessed     SENSATION: Light touch: Appears intact Proprioception: Appears intact  GAIT: Comments: decreased hip extension, forward flexed trunk  POSTURE: rounded shoulders, forward head,  decreased lumbar lordosis, increased thoracic kyphosis, posterior pelvic tilt, and flexed trunk    LUMBARAROM/PROM:  A/PROM A/PROM  Eval (% available)  Flexion 50  Extension 25, pain anteriorly   Right lateral flexion 50, pain anteriorly   Left lateral flexion 50, pain anteriorly    (Blank rows = not tested)   PALPATION:   General  Significant abdominal restriction and tenderness; decreased rib mobility with mobilization and breathing    TODAY'S TREATMENT:                                                                                                                              DATE:  08/30/23 Manual: ILU bowel mobilization  Abdominal scar tissue mobilization Ileocecal valve mobilization External rectum mobilization Myofascial release to abdomen with push/pull alternating forces horizontally  Diaphragm release All techniques performed in supine  Therapeutic activities: Tracking fiber to make sure she is getting 8-12g with each meal    08/22/23 Manual: ILU bowel mobilization  Abdominal scar tissue mobilization Ileocecal valve mobilization External rectum mobilization Myofascial release to abdomen with push/pull alternating forces horizontally  Exercises: Seated side bending stretch over bolster 10 breaths bil Seated thoracic openers 6x bil Seated forward fold 10 breaths  Seated thoracic extensions over foam roller 10x   08/15/23 Manual: ILU bowel mobilization  Abdominal scar tissue mobilization Ileocecal valve mobilization External rectum mobilization Exercises: Open books 10x bil Clam shells 10x bil Supine bridge + hip adduction 2 x 10 Supine bridge + hip abduction red band 2 x 10 Supine resisted march red band 2 x 10  PATIENT EDUCATION:  Education details: See above Person educated: Patient Education method: Programmer, multimedia, Demonstration, Tactile cues, Verbal cues, and Handouts Education comprehension: verbalized understanding  HOME EXERCISE  PROGRAM: [id number]  ASSESSMENT:  CLINICAL IMPRESSION: Pt has been having a lot of diarrhea since cleanse and now taking Miralax  on a regular basis; even though she is having frequent loose stools, she still feels like she is not emptying as well as she was prior to cleanse. She also feels increasing abdominal tightness. Increased manual techniques performed this session to help reduce this tightness and to see if she can get more relief. Diaphragm tightness addressed to see if this helped improve restriction and sensation of tightness.  She will continue to benefit from skilled PT intervention in order to decrease pain, improve bowel movements, and decrease urinary urgency/incontinence.     OBJECTIVE IMPAIRMENTS: decreased activity tolerance, decreased coordination, decreased endurance, decreased mobility, decreased strength, increased fascial restrictions, increased muscle spasms, impaired tone, postural dysfunction, and pain.   ACTIVITY LIMITATIONS: lifting, bending, continence, and locomotion level  PARTICIPATION LIMITATIONS: interpersonal relationship, community activity, and  occupation  PERSONAL FACTORS: 1 comorbidity: medical history are also affecting patient's functional outcome.   REHAB POTENTIAL: Good  CLINICAL DECISION MAKING: Stable/uncomplicated  EVALUATION COMPLEXITY: Low   GOALS: Goals reviewed with patient? Yes  SHORT TERM GOALS: Updated 08/01/23  Pt will be independent with HEP.   Baseline: Goal status: MET 07/12/22  2.  Pt will be independent with diaphragmatic breathing and down training activities in order to improve pelvic floor relaxation.  Baseline:  Goal status: MET 07/12/22  3.  Pt will be independent with the knack, urge suppression technique, and double voiding in order to improve bladder habits and decrease urinary incontinence.   Baseline:  Goal status: MET 09/22/22  4.  Pt will be independent with use of squatty potty, relaxed toileting mechanics, and  improved bowel movement techniques in order to increase ease of bowel movements and complete evacuation.   Baseline:  Goal status: MET 11/09/22  5.  Pt will be able to correctly perform diaphragmatic breathing and appropriate pressure management in order to prevent worsening vaginal wall laxity and improve pelvic floor A/ROM.   Baseline:  Goal status: MET 01/18/23  LONG TERM GOALS: Updated 08/22/23  Pt will be independent with advanced HEP.   Baseline:  Goal status: IN PROGRESS 08/22/23  2.  Pt will demonstrate normal pelvic floor muscle tone and A/ROM, able to achieve 4/5 strength with contractions and 10 sec endurance, in order to provide appropriate lumbopelvic support in functional activities.   Baseline: not assessed today per patient request  Goal status: IN PROGRESS 08/22/23  3.  Pt will increase all impaired lumbar A/ROM by 25% without pain.  Baseline: Pt seen a little bit of loss in some and improvement in extension Goal status:  IN PROGRESS 08/22/23  4.  Pt will report pain no higher than 3/10 with any activity. Baseline: pain has gotten up to 5/10, but currently at a 3/10 Goal status:  IN PROGRESS 08/22/23  5.  Pt will report 0/10 pain with vaginal penetration in order to improve intimate relationship with partner.    Baseline: no recent intercourse attempts  Goal status:  IN PROGRESS 08/22/23  6.  Pt will be able to go 2-3 hours in between voids without urgency or incontinence in order to improve QOL and perform all functional activities with less difficulty.   Baseline:  Goal status: MET 02/21/23  7.  Pt will report no leaks with laughing, coughing, sneezing in order to improve comfort with interpersonal relationships and community activities.   Baseline: Pt states that she feels 30-40% better Goal status:  IN PROGRESS 08/22/23  8.  Pt will have consistent bowel movements 4-5x/week without straining or pain.  Baseline:  Goal status:  MET 02/21/23  PLAN:  PT  FREQUENCY: 1-2x/week  PT DURATION: 6 months  PLANNED INTERVENTIONS: Therapeutic exercises, Therapeutic activity, Neuromuscular re-education, Balance training, Gait training, Patient/Family education, Self Care, Joint mobilization, Dry Needling, Biofeedback, and Manual therapy  PLAN FOR NEXT SESSION: strengthening program - reinforce importance and go over some exercises each session; internal pelvic floor muscle exam  Josette Mares, PT, DPT08/27/252:03 PM

## 2023-08-31 DIAGNOSIS — M13841 Other specified arthritis, right hand: Secondary | ICD-10-CM | POA: Diagnosis not present

## 2023-08-31 DIAGNOSIS — M654 Radial styloid tenosynovitis [de Quervain]: Secondary | ICD-10-CM | POA: Diagnosis not present

## 2023-08-31 DIAGNOSIS — M13842 Other specified arthritis, left hand: Secondary | ICD-10-CM | POA: Diagnosis not present

## 2023-08-31 DIAGNOSIS — M542 Cervicalgia: Secondary | ICD-10-CM | POA: Diagnosis not present

## 2023-09-01 ENCOUNTER — Other Ambulatory Visit: Payer: Self-pay | Admitting: Hematology & Oncology

## 2023-09-01 ENCOUNTER — Inpatient Hospital Stay

## 2023-09-01 ENCOUNTER — Inpatient Hospital Stay: Attending: Hematology & Oncology

## 2023-09-01 ENCOUNTER — Other Ambulatory Visit: Payer: Self-pay | Admitting: *Deleted

## 2023-09-01 ENCOUNTER — Other Ambulatory Visit: Payer: Self-pay

## 2023-09-01 DIAGNOSIS — T50905A Adverse effect of unspecified drugs, medicaments and biological substances, initial encounter: Secondary | ICD-10-CM | POA: Diagnosis not present

## 2023-09-01 DIAGNOSIS — K6389 Other specified diseases of intestine: Secondary | ICD-10-CM

## 2023-09-01 DIAGNOSIS — Z79899 Other long term (current) drug therapy: Secondary | ICD-10-CM | POA: Diagnosis not present

## 2023-09-01 DIAGNOSIS — E039 Hypothyroidism, unspecified: Secondary | ICD-10-CM

## 2023-09-01 DIAGNOSIS — M069 Rheumatoid arthritis, unspecified: Secondary | ICD-10-CM | POA: Diagnosis not present

## 2023-09-01 DIAGNOSIS — R790 Abnormal level of blood mineral: Secondary | ICD-10-CM

## 2023-09-01 DIAGNOSIS — R140 Abdominal distension (gaseous): Secondary | ICD-10-CM | POA: Diagnosis not present

## 2023-09-01 DIAGNOSIS — K582 Mixed irritable bowel syndrome: Secondary | ICD-10-CM | POA: Diagnosis not present

## 2023-09-01 LAB — CBC WITH DIFFERENTIAL (CANCER CENTER ONLY)
Abs Immature Granulocytes: 0.02 K/uL (ref 0.00–0.07)
Basophils Absolute: 0 K/uL (ref 0.0–0.1)
Basophils Relative: 1 %
Eosinophils Absolute: 0.1 K/uL (ref 0.0–0.5)
Eosinophils Relative: 3 %
HCT: 36.6 % (ref 36.0–46.0)
Hemoglobin: 11.9 g/dL — ABNORMAL LOW (ref 12.0–15.0)
Immature Granulocytes: 0 %
Lymphocytes Relative: 41 %
Lymphs Abs: 1.9 K/uL (ref 0.7–4.0)
MCH: 32 pg (ref 26.0–34.0)
MCHC: 32.5 g/dL (ref 30.0–36.0)
MCV: 98.4 fL (ref 80.0–100.0)
Monocytes Absolute: 0.5 K/uL (ref 0.1–1.0)
Monocytes Relative: 11 %
Neutro Abs: 2.1 K/uL (ref 1.7–7.7)
Neutrophils Relative %: 44 %
Platelet Count: 201 K/uL (ref 150–400)
RBC: 3.72 MIL/uL — ABNORMAL LOW (ref 3.87–5.11)
RDW: 11.9 % (ref 11.5–15.5)
WBC Count: 4.6 K/uL (ref 4.0–10.5)
nRBC: 0 % (ref 0.0–0.2)

## 2023-09-01 LAB — CMP (CANCER CENTER ONLY)
ALT: 17 U/L (ref 0–44)
AST: 20 U/L (ref 15–41)
Albumin: 4.3 g/dL (ref 3.5–5.0)
Alkaline Phosphatase: 134 U/L — ABNORMAL HIGH (ref 38–126)
Anion gap: 10 (ref 5–15)
BUN: 7 mg/dL (ref 6–20)
CO2: 25 mmol/L (ref 22–32)
Calcium: 9.3 mg/dL (ref 8.9–10.3)
Chloride: 105 mmol/L (ref 98–111)
Creatinine: 0.64 mg/dL (ref 0.44–1.00)
GFR, Estimated: >60 mL/min (ref >60)
Glucose, Bld: 93 mg/dL (ref 70–99)
Potassium: 4 mmol/L (ref 3.5–5.1)
Sodium: 141 mmol/L (ref 135–145)
Total Bilirubin: 0.4 mg/dL (ref 0.0–1.2)
Total Protein: 7.1 g/dL (ref 6.5–8.1)

## 2023-09-01 LAB — IRON AND IRON BINDING CAPACITY (CC-WL,HP ONLY)
Iron: 71 ug/dL (ref 28–170)
Saturation Ratios: 22 % (ref 10.4–31.8)
TIBC: 318 ug/dL (ref 250–450)
UIBC: 247 ug/dL

## 2023-09-01 LAB — RETICULOCYTES
Immature Retic Fract: 14.2 % (ref 2.3–15.9)
RBC.: 3.69 MIL/uL — ABNORMAL LOW (ref 3.87–5.11)
Retic Count, Absolute: 79 K/uL (ref 19.0–186.0)
Retic Ct Pct: 2.1 % (ref 0.4–3.1)

## 2023-09-01 LAB — TSH: TSH: 0.474 u[IU]/mL (ref 0.350–4.500)

## 2023-09-01 LAB — FERRITIN: Ferritin: 120 ng/mL (ref 11–307)

## 2023-09-01 NOTE — Telephone Encounter (Addendum)
 Called the patient to relay that, per Dr. Sonna, weight gain is not typically associated with constipation if a patient is having daily bowel movements. The patient verbalized understanding. She expressed concern that the weight gain could be related to ascites. The nurse advised the patient to journal her symptoms over the next one to two weeks and follow up with the clinic. The patient was also instructed to seek emergency care if she experiences any respiratory distress. Patient verbalized understanding of the plan.

## 2023-09-01 NOTE — Patient Instructions (Signed)

## 2023-09-01 NOTE — Progress Notes (Signed)
 Clinical Intervention Note  Clinical Intervention Notes: Patient called and states that she is still having possible side effects to Humira  despite switch to CF product. She is experiencing pain/bloating/discomfort in abdomen which may or may not be related to concurrent GI concerns. Based on patient description this does not appear to be true injection site reaction but perhaps intolerance or unrelated to Humira . Additionally patient is experiencing some shortness of breath and had an increased blood pressure reading at her primary doctor's appointment. She has done 3 of the last 4 injections in abdomen after having trouble with her previous injection in the upper arm. Offered counseling about switching to injecting in thigh but patient is reluctant to do this due to existing leg issues including lymphedema. Due to the complex nature of the patient's concurrent medical conditions and her history referred her to continue this conversation further with her primary care provider as they have already begun testing to assist her. Offered patient our continued support as needed with regards to the Humira .   Clinical Intervention Outcomes: Prevention of an adverse drug event   Karen [name] Specialty Pharmacist

## 2023-09-01 NOTE — Telephone Encounter (Signed)
 Received a message from the patient stating that she saw her primary care provider this morning due to concerns about possible side effects from Humira . According to the patient and her PCP's assessment, she has experienced weight gain, sluggish bowel sounds, abdominal heaviness and fullness, and abdominal swelling, which the patient believes may be fluid-related. She reports having daily bowel movements, but notes they are not as productive as they have been in the past. She does not believe the weight gain is related to stool. The patient would like to receive Dr. Eino input from a gastrointestinal perspective.  The patient states that her primary care provider is unsure how to proceed. She plans to have labs drawn on Tuesday at her hematologist's office.   Routing to Dr. Sonna for advice.

## 2023-09-02 ENCOUNTER — Ambulatory Visit: Payer: Self-pay | Admitting: Hematology & Oncology

## 2023-09-04 ENCOUNTER — Encounter (HOSPITAL_BASED_OUTPATIENT_CLINIC_OR_DEPARTMENT_OTHER): Payer: Self-pay | Admitting: *Deleted

## 2023-09-04 ENCOUNTER — Emergency Department (HOSPITAL_BASED_OUTPATIENT_CLINIC_OR_DEPARTMENT_OTHER)

## 2023-09-04 ENCOUNTER — Other Ambulatory Visit: Payer: Self-pay

## 2023-09-04 ENCOUNTER — Emergency Department (HOSPITAL_BASED_OUTPATIENT_CLINIC_OR_DEPARTMENT_OTHER)
Admission: EM | Admit: 2023-09-04 | Discharge: 2023-09-04 | Disposition: A | Discharge status: Home / Self Care | Attending: Emergency Medicine | Admitting: Emergency Medicine

## 2023-09-04 ENCOUNTER — Emergency Department (HOSPITAL_BASED_OUTPATIENT_CLINIC_OR_DEPARTMENT_OTHER): Admitting: Radiology

## 2023-09-04 DIAGNOSIS — R0600 Dyspnea, unspecified: Secondary | ICD-10-CM | POA: Diagnosis not present

## 2023-09-04 DIAGNOSIS — T82848A Pain from vascular prosthetic devices, implants and grafts, initial encounter: Secondary | ICD-10-CM | POA: Diagnosis not present

## 2023-09-04 DIAGNOSIS — R5381 Other malaise: Secondary | ICD-10-CM | POA: Insufficient documentation

## 2023-09-04 DIAGNOSIS — R5383 Other fatigue: Secondary | ICD-10-CM | POA: Diagnosis not present

## 2023-09-04 DIAGNOSIS — I2699 Other pulmonary embolism without acute cor pulmonale: Secondary | ICD-10-CM | POA: Diagnosis not present

## 2023-09-04 DIAGNOSIS — R519 Headache, unspecified: Secondary | ICD-10-CM | POA: Insufficient documentation

## 2023-09-04 DIAGNOSIS — R0602 Shortness of breath: Secondary | ICD-10-CM | POA: Diagnosis not present

## 2023-09-04 DIAGNOSIS — R531 Weakness: Secondary | ICD-10-CM | POA: Diagnosis not present

## 2023-09-04 DIAGNOSIS — M7989 Other specified soft tissue disorders: Secondary | ICD-10-CM | POA: Diagnosis not present

## 2023-09-04 DIAGNOSIS — R221 Localized swelling, mass and lump, neck: Secondary | ICD-10-CM | POA: Diagnosis not present

## 2023-09-04 LAB — CBC WITH DIFFERENTIAL/PLATELET
Abs Immature Granulocytes: 0.01 K/uL (ref 0.00–0.07)
Basophils Absolute: 0 K/uL (ref 0.0–0.1)
Basophils Relative: 1 %
Eosinophils Absolute: 0.2 K/uL (ref 0.0–0.5)
Eosinophils Relative: 3 %
HCT: 41.7 % (ref 36.0–46.0)
Hemoglobin: 13.5 g/dL (ref 12.0–15.0)
Immature Granulocytes: 0 %
Lymphocytes Relative: 36 %
Lymphs Abs: 2 K/uL (ref 0.7–4.0)
MCH: 31.8 pg (ref 26.0–34.0)
MCHC: 32.4 g/dL (ref 30.0–36.0)
MCV: 98.3 fL (ref 80.0–100.0)
Monocytes Absolute: 0.5 K/uL (ref 0.1–1.0)
Monocytes Relative: 8 %
Neutro Abs: 2.8 K/uL (ref 1.7–7.7)
Neutrophils Relative %: 52 %
Platelets: 215 K/uL (ref 150–400)
RBC: 4.24 MIL/uL (ref 3.87–5.11)
RDW: 12 % (ref 11.5–15.5)
WBC: 5.5 K/uL (ref 4.0–10.5)
nRBC: 0 % (ref 0.0–0.2)

## 2023-09-04 LAB — COMPREHENSIVE METABOLIC PANEL WITH GFR
ALT: 17 U/L (ref 0–44)
AST: 33 U/L (ref 15–41)
Albumin: 4.4 g/dL (ref 3.5–5.0)
Alkaline Phosphatase: 137 U/L — ABNORMAL HIGH (ref 38–126)
Anion gap: 14 (ref 5–15)
BUN: 13 mg/dL (ref 6–20)
CO2: 19 mmol/L — ABNORMAL LOW (ref 22–32)
Calcium: 9.9 mg/dL (ref 8.9–10.3)
Chloride: 104 mmol/L (ref 98–111)
Creatinine, Ser: 0.8 mg/dL (ref 0.44–1.00)
GFR, Estimated: >60 mL/min (ref >60)
Glucose, Bld: 90 mg/dL (ref 70–99)
Potassium: 4.8 mmol/L (ref 3.5–5.1)
Sodium: 137 mmol/L (ref 135–145)
Total Bilirubin: 0.6 mg/dL (ref 0.0–1.2)
Total Protein: 7.8 g/dL (ref 6.5–8.1)

## 2023-09-04 LAB — LIPASE, BLOOD: Lipase: 46 U/L (ref 11–51)

## 2023-09-04 LAB — URINALYSIS, ROUTINE W REFLEX MICROSCOPIC
Bilirubin Urine: NEGATIVE
Glucose, UA: NEGATIVE mg/dL
Hgb urine dipstick: NEGATIVE
Ketones, ur: NEGATIVE mg/dL
Leukocytes,Ua: NEGATIVE
Nitrite: NEGATIVE
Protein, ur: NEGATIVE mg/dL
Specific Gravity, Urine: 1.006 (ref 1.005–1.030)
pH: 6.5 (ref 5.0–8.0)

## 2023-09-04 LAB — CK: Total CK: 99 U/L (ref 38–234)

## 2023-09-04 LAB — PRO BRAIN NATRIURETIC PEPTIDE: Pro Brain Natriuretic Peptide: <50 pg/mL (ref <300.0)

## 2023-09-04 LAB — TROPONIN T, HIGH SENSITIVITY
Troponin T High Sensitivity: <15 ng/L (ref 0–19)
Troponin T High Sensitivity: <15 ng/L (ref 0–19)

## 2023-09-04 MED ORDER — IOHEXOL 350 MG/ML SOLN
100.0000 mL | Freq: Once | INTRAVENOUS | Status: AC | PRN
  Administered 2023-09-04: 100 mL via INTRAVENOUS

## 2023-09-04 NOTE — ED Provider Notes (Signed)
 [place] [facility] AT [facility] Provider Note   CSN: [id number] Arrival date & time: 09/04/23  1528     Patient presents with: Neck Swelling   Karen [name] is a 53 y.o. female.   HPI Patient reports that she has a port in place on the right side of her chest.  Patient has a port which was placed July 10, 2023.  Patient has history of hereditary hemochromatosis and requires frequent phlebotomy.  Patient reports after she got labs drawn and the port flushed 3 days ago, she has been having pain and swelling at the base of her neck on the side of the port.  She reports she is also had general symptoms of feeling some exertional dyspnea, fatigue and generalized headache.  Patient denies difficulty breathing due to neck swelling but she has objectively noticed some and contacted her doctor regarding this.  They had concerns for neck swelling in association with the patient's port and recommended she come to the emergency department for further evaluation.    Prior to Admission medications   Medication Sig Start Date End Date Taking? Authorizing Provider  Acetaminophen  500 MG capsule 1 capsule as needed Orally every 6 hrs    [provider]  adalimumab  (HUMIRA , 2 PEN,) 40 MG/0.4ML pen 0.4 mL Subcutaneous EVERY 2 WEEKS for 30 days 06/26/23   [provider]  adalimumab  (HUMIRA , 2 PEN,) 40 MG/0.4ML pen Inject 0.4 mLs (40 mg total) into the skin every 14 (fourteen) days. 08/08/23     albuterol  (VENTOLIN  HFA) 108 (90 Base) MCG/ACT inhaler Inhale 2 puffs into the lungs every 6 (six) hours as needed for wheezing or shortness of breath. 12/08/22   Iva Marty Saltness, MD  Elderberry 575 MG/5ML SYRP Take 30 mLs by mouth daily.    [provider]  fluticasone  (FLONASE ) 50 MCG/ACT nasal spray Place 2 sprays into both nostrils daily. 04/19/23   Karis Clunes, MD  Lactobacillus Casei-Folic Acid  (RESTORA RX) 60-1.25 MG CAPS Take 1 capsule by mouth every morning.  11/14/22     lansoprazole  (PREVACID ) 30 MG capsule Take 1 capsule (30 mg total) by mouth daily. 06/29/23     levocetirizine (XYZAL ) 5 MG tablet Take 1 tablet (5 mg total) by mouth every evening. 12/08/22   Iva Marty Saltness, MD  lidocaine -prilocaine  (EMLA ) cream Apply 1 Application topically as needed. 07/11/23   Timmy Maude SAUNDERS, MD  Lifitegrast  (XIIDRA ) 5 % SOLN Place 1 drop into both eyes 2 (two) times daily. 10/12/22     methocarbamol  (ROBAXIN ) 500 MG tablet Take 1 tablet (500 mg total) by mouth 3 (three) times daily. 07/21/23     ondansetron  (ZOFRAN -ODT) 4 MG disintegrating tablet Dissolve 1 tablet (4 mg total) in mouth every 8 (eight) hours as needed for nausea 03/03/22     ondansetron  (ZOFRAN -ODT) 4 MG disintegrating tablet Take 1 tablet (4 mg total) by mouth every 8 (eight) hours as needed for nausea. 06/29/23     polyethylene glycol (MIRALAX  / GLYCOLAX ) 17 g packet Take 17 g by mouth every other day.    [provider]  rizatriptan  (MAXALT ) 5 MG tablet Take 1 tablet (5 mg total) by mouth as needed for migraine. May repeat in 2 hours if needed, no more than 2 pills/24 hours, no more than 3 pills/week. 11/10/22   Athar, Saima, MD  triamcinolone  ointment (KENALOG ) 0.1 % Apply pea sized amount to affected vulvar and perianal area three times per week. Increase to twice daily for flares.  05/05/23     UNABLE TO FIND Med Name: NIFEDIPINE 0.3%/LIDO1.5% OINT    [provider]    Allergies: Chlorhexidine , Molds & smuts, Alpha-gal, and Methotrexate     Review of Systems  Updated Vital Signs BP (!) 129/95   Pulse 66   Temp 98 F (36.7 C)   Resp 16   SpO2 98%   Physical Exam Constitutional:      Comments: Alert nontoxic clinically well in appearance.  Mental status is clear.  No respiratory distress.  HENT:     Head: Normocephalic and atraumatic.     Nose: Nose normal.     Mouth/Throat:     Mouth: Mucous membranes are moist.     Pharynx: Oropharynx is clear.     Comments:  Posterior airway widely patent. Eyes:     Extraocular Movements: Extraocular movements intact.     Pupils: Pupils are equal, round, and reactive to light.  Neck:     Comments: Neck is supple without meningismus.  There is a perceptible fullness on the right side at the base of the neck at about the area of the SCM insertion.  This is fairly diffuse, no overlying erythema, no pulsatile bowel mass.  The area is soft and pliable but the patient finds palpation uncomfortable.  Objectively no significant lymphadenopathy. Cardiovascular:     Rate and Rhythm: Normal rate and regular rhythm.  Pulmonary:     Effort: Pulmonary effort is normal.     Breath sounds: Normal breath sounds.  Abdominal:     General: There is no distension.     Palpations: Abdomen is soft.     Tenderness: There is no abdominal tenderness. There is no guarding.  Musculoskeletal:        General: No swelling or tenderness. Normal range of motion.     Right lower leg: No edema.     Left lower leg: No edema.  Skin:    General: Skin is warm and dry.  Neurological:     General: No focal deficit present.     Mental Status: She is oriented to person, place, and time.     Motor: No weakness.     Coordination: Coordination normal.  Psychiatric:        Mood and Affect: Mood normal.     (all labs ordered are listed, but only abnormal results are displayed) Labs Reviewed  COMPREHENSIVE METABOLIC PANEL WITH GFR - Abnormal; Notable for the following components:      Result Value   CO2 19 (*)    Alkaline Phosphatase 137 (*)    All other components within normal limits  URINALYSIS, ROUTINE W REFLEX MICROSCOPIC - Abnormal; Notable for the following components:   Color, Urine COLORLESS (*)    All other components within normal limits  PRO BRAIN NATRIURETIC PEPTIDE  LIPASE, BLOOD  CBC WITH DIFFERENTIAL/PLATELET  CK  TROPONIN T, HIGH SENSITIVITY  TROPONIN T, HIGH SENSITIVITY    EKG: None  Radiology: CT Angio Chest PE  W/Cm &/Or Wo Cm Result Date: 09/04/2023 CLINICAL DATA:  Right neck swelling, chest port malfunction, pulmonary embolism EXAM: CT ANGIOGRAPHY CHEST WITH CONTRAST TECHNIQUE: Multidetector CT imaging of the chest was performed using the standard protocol during bolus administration of intravenous contrast. Multiplanar CT image reconstructions and MIPs were obtained to evaluate the vascular anatomy. RADIATION DOSE REDUCTION: This exam was performed according to the departmental dose-optimization program which includes automated exposure control, adjustment of the mA and/or kV according to patient size and/or use  of iterative reconstruction technique. CONTRAST:  OMNIPAQUE  IOHEXOL  350 MG/ML SOLN COMPARISON:  11/08/2022 FINDINGS: Cardiovascular: Contrast was instilled through left upper extremity and, as result, there is significant mixing artifact within the superior vena cava surrounding the chest port catheter. This precludes assessment for the presence of pericatheter thrombus. The superior vena cava itself is patent, as is the left subclavian and brachiocephalic veins. Right internal jugular chest port tip is seen within the mid right atrium No significant coronary artery calcification. Cardiac size is within normal limits. Adequate opacification of the pulmonary arterial tree. No intraluminal filling defect identified to suggest acute pulmonary embolism. Central pulmonary arteries are of normal caliber. No pericardial effusion. No significant atherosclerotic calcification within the thoracic aorta. No aortic aneurysm. Mediastinum/Nodes: No enlarged mediastinal, hilar, or axillary lymph nodes. Thyroid  gland, trachea, and esophagus demonstrate no significant findings. Lungs/Pleura: Lungs are clear. No pleural effusion or pneumothorax. Upper Abdomen: No acute abnormality. Musculoskeletal: No chest wall abnormality. No acute or significant osseous findings. Review of the MIP images confirms the above findings.  IMPRESSION: 1. No radiographic evidence of acute cardiopulmonary disease. No pulmonary embolism. Electronically Signed   By: Dorethia Molt M.D.   On: 09/04/2023 20:27   CT Soft Tissue Neck W Contrast Result Date: 09/04/2023 EXAM: CT NECK WITH CONTRAST 09/04/2023 07:37:31 PM TECHNIQUE: CT of the neck was performed with the administration of 100mL iohexol  (OMNIPAQUE ) 350 MG/ML injection 100 mL IOHEXOL  350 MG/ML SOLN intravenous contrast. Multiplanar reformatted images are provided for review. Automated exposure control, iterative reconstruction, and/or weight based adjustment of the mA/kV was utilized to reduce the radiation dose to as low as reasonably achievable. COMPARISON: None available. CLINICAL HISTORY: Swelling base of neck on right, rule out mass, vascular anomaly, port complication. Table formatting from the original note was not included. Pt to ED reporting worsening right sided neck swelling since having port flushed on Friday. No redness or heat noted to the neck or the port. No fevers. Pt told to come to ED to rule out air embolus. Pt reporting increased shortness of breath and weakness on exertion. FINDINGS: AERODIGESTIVE TRACT: No discrete mass. No edema. SALIVARY GLANDS: The parotid and submandibular glands are unremarkable. THYROID : Unremarkable. LYMPH NODES: No suspicious cervical lymphadenopathy. SOFT TISSUES: No mass or fluid collection. BRAIN, ORBITS, SINUSES AND MASTOIDS: No acute abnormality. LUNGS AND MEDIASTINUM: No acute abnormality. BONES: No focal bone abnormality. IMPRESSION: 1. No acute findings or discrete mass in the neck. Electronically signed by: Franky Stanford MD 09/04/2023 08:15 PM EDT RP Workstation: [id number]   DG Chest 2 View Result Date: 09/04/2023 CLINICAL DATA:  Pain at port site. Right-sided neck swelling after having port flushed. EXAM: CHEST - 2 VIEW COMPARISON:  11/08/2022. FINDINGS: The heart size and mediastinal contours are within normal limits. A right chest  port terminates over the superior vena cava. No consolidation, effusion, or pneumothorax is seen. Surgical clips are present in the right upper quadrant. No acute osseous abnormality. IMPRESSION: No active cardiopulmonary disease. Electronically Signed   By: Leita Birmingham M.D.   On: 09/04/2023 17:23     Procedures   Medications Ordered in the ED  iohexol  (OMNIPAQUE ) 350 MG/ML injection 100 mL (100 mLs Intravenous Contrast Given 09/04/23 1923)                                    Medical Decision Making Amount and/or Complexity of Data Reviewed Labs: ordered. Radiology:  ordered.  Risk Prescription drug management.   Patient presents as outlined.  She is referred to the emergency department with concern for complication of her port.  Patient also describes exertional dyspnea atypical for her.  At this point we will proceed with diagnostic imaging for nonspecific swelling of the neck and dyspnea in the setting of chest port with concern for complication.  Consult: Dr. [facility] for imaging choice based on history, symptoms, and risks.  Will proceed with CT angiogram to rule out PE and CT soft tissue neck.  Troponin less than 15 urinalysis negative CK 99 lipase 46 metabolic panel normal with GFR greater than 60 except alk phosphatase 137 BNP less than 50 CBC normal with normal differential.  CT PE study and CT soft tissue neck reviewed by radiology no acute findings.  Patient is clinical in appearance.  Vital signs are stable with blood pressure normal no fever, no hypoxia and normal sinus rhythm in the 60s.  At this time with PE ruled out, no apparent complications of her report in the setting of recent flushing and use 3 days ago, I do feel patient is stable for discharge.  She does not show any signs of sepsis, and at this time low suspicion for port infection.  As she has a tender area of the base of the neck however on examination there is no palpable mass and no sign of vascular  engorgement or palpable thrill.  Unclear etiology however this is at the base of the SCM and may be musculoskeletal.  Stable for surveillance and outpatient follow-up with PCP and hematology.  I have reviewed return precautions patient was discharged in good condition.      Final diagnoses:  Localized swelling, mass and lump, neck  Dyspnea, unspecified type  Malaise and fatigue    ED Discharge Orders     None          Armenta Canning, MD 09/04/23 2147

## 2023-09-04 NOTE — ED Triage Notes (Signed)
 Pt to ED reporting worsening right sided neck swelling since having port flushed on Friday. No redness or heat noted to the neck or the port. No fevers. Pt told to come to ED to rule out air embolus   Pt reporting increased shortness of breath and weakness on exertion.

## 2023-09-04 NOTE — Discharge Instructions (Signed)
 1.  Follow-up with your hematologist and family doctor soon as possible. 2.  Return to the emergency department if you develop fever, worsening shortness of breath or other concerning symptoms.

## 2023-09-04 NOTE — ED Notes (Signed)
 Patient transported to CT

## 2023-09-04 NOTE — ED Notes (Signed)
 Reviewed AVS/discharge instruction with patient. Time allotted for and all questions answered. Patient is agreeable for d/c and escorted to ed exit by staff.

## 2023-09-05 ENCOUNTER — Ambulatory Visit: Attending: Surgery | Admitting: Occupational Therapy

## 2023-09-05 ENCOUNTER — Encounter: Payer: Self-pay | Admitting: Occupational Therapy

## 2023-09-05 ENCOUNTER — Inpatient Hospital Stay: Attending: Hematology & Oncology

## 2023-09-05 ENCOUNTER — Telehealth: Payer: Self-pay

## 2023-09-05 DIAGNOSIS — I890 Lymphedema, not elsewhere classified: Secondary | ICD-10-CM | POA: Diagnosis not present

## 2023-09-05 NOTE — Telephone Encounter (Signed)
 Patient called,she went to ED yesterday due to neck swelling around port site. Cleared from ED and was told to follow up with us . Dr.Ennever reviewed ED notes, pt to come in this week for a port evaluation with nurse. Called patient who states she can come Friday, she will call if it worsens before Friday.

## 2023-09-05 NOTE — Therapy (Signed)
 OUTPATIENT OCCUPATIONAL THERAPY TREATMENT NOTE  BLE/ BLQ LYMPHEDEMA  Patient Name: Karen [name] MRN: [id number] DOB:1970-06-18, 53 y.o., female Today's Date: 09/05/2023  REPORTING PERIOD:  END OF SESSION:   OT End of Session - 09/05/23 1139     Visit Number 52    Number of Visits 72    Date for OT Re-Evaluation 11/16/23    OT Start Time 0805    OT Stop Time 0910    OT Time Calculation (min) 65 min    Activity Tolerance Patient tolerated treatment well;No increased pain    Behavior During Therapy [facility] for tasks assessed/performed            Past Medical History:  Diagnosis Date   Adenomyosis    Anemia    Arthritis 2013   L knee gets steroid injections    Arthritis, rheumatic, acute or subacute    Asthmatic bronchitis 01/31/2017   Back pain    Chronic constipation    Diarrhea    DVT of lower extremity (deep venous thrombosis) (HCC)    Dysmenorrhea    Endometriosis    Esophageal stricture    G6PD deficiency    GERD (gastroesophageal reflux disease)    History of kidney stones    History of uterine fibroid    IBS (irritable bowel syndrome)    Interstitial cystitis    Lactose intolerance    Migraine    with aura   Other hemochromatosis 06/10/2021   PONV (postoperative nausea and vomiting)    Recurrent upper respiratory infection (URI)    Sebaceous cyst of breast, right lower inner quadrant 10/25/2012   Excised 11/21/12    Sleep apnea    Syncope, non cardiac    Past Surgical History:  Procedure Laterality Date   ARTHROSCOPIC HAGLUNDS REPAIR     BREAST CYST EXCISION Right 11/21/2012   Procedure: CYST EXCISION BREAST;  Surgeon: Sherlean JINNY Laughter, MD;  Location: [facility];  Service: General;  Laterality: Right;   BREAST CYST EXCISION Bilateral 01/18/2022   Procedure: EXCISION OF SEBACEOUS CYST BILATERAL BREAST;  Surgeon: Vanderbilt Ned, MD;  [facility];  Service: General;  Laterality: Bilateral;   BREAST EXCISIONAL  BIOPSY Right 11/2012   CESAREAN SECTION  04/19/1993   CHOLECYSTECTOMY     COLONOSCOPY  05/02/2011   Procedure: COLONOSCOPY;  Surgeon: Lynwood LITTIE Celestia Mickey., MD;  Location: [facility];  Service: Endoscopy;  Laterality: N/A;   colonoscopy  11/04/2014   DENTAL SURGERY  03/06/2018   2 surgeries ( 03/06/2018 and 04/06/2018)   to remove two separate benign tumors   ESOPHAGEAL MANOMETRY N/A 11/04/2015   Procedure: ESOPHAGEAL MANOMETRY (EM);  Surgeon: Lynwood Celestia, MD;  Location: [facility];  Service: Endoscopy;  Laterality: N/A;   ESOPHAGOGASTRODUODENOSCOPY  05/02/2011   Procedure: ESOPHAGOGASTRODUODENOSCOPY (EGD);  Surgeon: Lynwood LITTIE Celestia Mickey., MD;  Location: [facility];  Service: Endoscopy;  Laterality: N/A;   ESOPHAGOGASTRODUODENOSCOPY N/A 09/01/2014   Procedure: ESOPHAGOGASTRODUODENOSCOPY (EGD);  Surgeon: Lynwood Celestia, MD;  Location: [facility];  Service: Endoscopy;  Laterality: N/A;   IR IMAGING GUIDED PORT INSERTION  07/06/2023   KNEE SURGERY     LAPAROSCOPY     x 4   LESION REMOVAL N/A 01/18/2022   Procedure: EXCISION OF SEBACEOUS CYSTS CHEST AND NECK;  Surgeon: Vanderbilt Ned, MD;  Location: [facility];  Service: General;  Laterality: N/A;   PH IMPEDANCE STUDY N/A 11/04/2015   Procedure: PH IMPEDANCE STUDY;  Surgeon: Lynwood Celestia, MD;  Location: [facility];  Service: Endoscopy;  Laterality: N/A;   VAGINAL HYSTERECTOMY  2010   TVH--ovaries remain   Patient Active Problem List   Diagnosis Date Noted   Sjogren syndrome with keratoconjunctivitis (HCC) 12/09/2022   Other hemochromatosis 06/10/2021   Elevated LFTs 04/04/2021   Colitis 04/04/2021   Bacterial overgrowth syndrome 05/21/2020   Obesity 05/21/2020   History of endometriosis 05/21/2020   Constipation due to outlet dysfunction 02/05/2020   Gastroesophageal reflux disease 02/05/2020   Obstructive sleep apnea treated with continuous positive airway pressure (CPAP) 06/16/2017   Perennial allergic rhinitis  with a nonallergic component 01/31/2017   Asthmatic bronchitis 01/31/2017   GI symptoms 01/31/2017   Family history of colon cancer 01/27/2015   Chest pain 12/13/2012   Dyspnea 12/13/2012   DVT of lower extremity (deep venous thrombosis) (HCC) 01/03/1990    PCP: Gaile New, MD  REFERRING PROVIDER: Valery Ripple, MD  REFERRING DIAG: I89.0  THERAPY DIAG:  Lymphedema, not elsewhere classified  Rationale for Evaluation and Treatment: Rehabilitation  ONSET DATE: ~2014; exacerbation in 2023  SUBJECTIVE:                                                                                                                                                                                          SUBJECTIVE STATEMENT:  Pt presents for OT to address lower quadrant, including deep abdominal lymphatics, and LLE lymphedema w/ associated pain. Pt reports her PCP recommended she go to ED yesterday after she noticed R neck swelling with discomfort at port site. ED ruled out mass, lymphadenopathy, PE, and infection. Pt reports port site remains tender to light palpation this morning and noticeable swelling is still there. Pt does not rate pain/ discomfort, in the abdomen, neck or legs numerically. She reports ongoing constipation.  PERTINENT HISTORY: CVI, OSA (no CPAP), GERD, Esophageal stricture, IBS, Lactose intolerant, Diarrhea, Hx LE DVT, recent RA dx  PAIN:  Are you having pain? Yes: not rated Pain location: abdomen; digestive discomfort Pain description: abdominal fullness, tightness Aggravating factors: standing, walking,  Relieving factors: MLD, Miralax ,   PRECAUTIONS: Other: LYMPHEDEMA; Hx LLE DVT  WEIGHT BEARING RESTRICTIONS: No  FALLS:  Has patient fallen in last 6 months? No  LIVING ENVIRONMENT: Lives with: lives with spouse and daughter Lives in: House/apartment Stairs: Yes; Internal: 14 steps; yes and External: 4 steps; yes Has following equipment at home:  None  OCCUPATION: [employer] operational officer, walking, desk work  LEISURE: family time  HAND DOMINANCE: right   PRIOR LEVEL OF FUNCTION: Independent  PATIENT GOALS: Feel better, be able to be more active without pain, wear preferred street shoes. NEW 08/03/23:  Be able to manage digestive discomfort and gut inflammation symptoms without medications  OBJECTIVE: Note: Objective measures were completed at Evaluation unless otherwise noted.  COGNITION:  Overall cognitive status: Within functional limits for tasks assessed   POSTURE: WFL  LE ROM: WFL  LE MMT: WFL  LYMPHEDEMA ASSESSMENTS: non-Ca related  INFECTIONS: denies cellulitis and wound hx  BLE COMPARATIVE LIMB VOLUMETRICS Initial 11/15/22  LANDMARK RIGHT (dominant)  R LEG (A-D) 3028.9 ml  R THIGH (E-G) 4809.60 ml  R FULL LIMB (A-G) 7838.5 ml  Limb Volume differential (LVD)  %  Volume change since initial %  Volume change overall V  (Blank rows = not tested)  LANDMARK LEFT  (Rx)  L LEG (A-D) 3189.9 ml  L THIGH (E-G) 4959.8 ml  L FULL LIMB (A-G) 8149.7 ml  Limb Volume differential (LVD)  LEG LVD = 5.32%, L>R; THIGH LVD = 3.12%, L>R; And full LLE LVD = 3.97%, L>R.   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  Skin  Description Hyper-Keratosis Peau d' Orange Shiny Tight Fibrotic/ Indurated Fatty Doughy Spongy/ boggy       R>L x  x   Skin dry Flaky WNL Macerated   mildly      Color Redness Varicosities Blanching Hemosiderin Stain Mottled        x   Odor Malodorous Yeast Fungal infection  WNL      x   Temperature Warm  Cool wnl     x    Pitting Edema   1+ 2+ 3+ 4+ Non-pitting         x   Girth Symmetrical Asymmetrical                   Distribution    L>R toes to groin    Stemmer Sign Positive Negative   +L -R   Lymphorrhea History Of:  Present Absent     x    Wounds History Of Present Absent Venous Arterial Pressure Sheer     x        Signs of Infection Redness Warmth Erythema Acute Swelling Drainage Borders                    Sensation Light Touch Deep pressure Hypersensitivity   In tact Impaired In tact Impaired Absent Impaired   x  x  x     Nails WNL   Fungus nail dystrophy   x     Hair Growth Symmetrical Asymmetrical   x    Skin Creases Base of toes  Ankles   Base of Fingers knees       Abdominal pannus Thigh Lobules  Face/neck   x           GAIT: Distance walked: >500' Assistive device utilized: None Level of assistance: Complete Independence Comments: Functional ambulation and transfers [facility]  LYMPHEDEMA LIFE IMPACT SCALE (LLIS): Initial 11/15/22  50%  FOTO functional outcome measure: Deferred. FOTO discontinued.  TODAY'S TREATMENT:  MLD to abdominal lymphatics Pt edu for LE self care Pt edu for trunk rotation stretch in side lying (unwinding)  PATIENT EDUCATION:  Continued Pt/ CG edu for how function of cisterna chyli, part of the thoracic duct of deep abdominal lymphatics, assists with digestion by filtering many of the fats and proteins from the digestive system. Sub optimal lymphatic flow in this region may result in constipation, bloating, swelling, etc. MLD helps mobilize these protein and fats , and the rhythmic movement of manual lymphatic drainage stimulates digestive muscles and increase peristalsis. Provided printed resource for reference.  Topics include outcome of comparative limb volumetrics- starting limb  volume differentials (LVDs), technology and gradient techniques used for short stretch, multilayer compression wrapping, simple self-MLD, therapeutic lymphatic pumping exercises, skin/nail care, LE precautions,. compression garment recommendations and specifications, wear and care schedule and compression garment donning / doffing w assistive devices. Discussed progress towards all OT goals since commencing CDT. All questions answered to the Pt's satisfaction. Good return. Person educated: Patient  Education method: Explanation, Demonstration, and Handouts Education comprehension: verbalized understanding, returned demonstration, verbal cues required, and needs further education  HOME EXERCISE PROGRAM: BLE lymphatic pumping there ex using- 1 sets of 10 reps, each exercise in order-  1-2 x daily, bilaterally Simple self MLD 1 x daily Daily skin care to increase hydration, skin mobility and decrease infection risk- can be done during MLD Compression wraps 23/7 during intensive phase of CDT Compression garments/ devices during self-management phase of CDT  ASSESSMENT:  CLINICAL IMPRESSION: No short neck sequence to either side due to acute onset of port site tenderness and R lateral neck and slight supraclavicular swelling. No obvious lymphadenopathy palpated. Pt also continues to have digestive discomfort associated with chronic constipation. Pt performed lymphatic pumping therex of the neck instead of MLD, including L lateral flexion and head turning- both x 10 reps bilaterally with slow diaphragmatic breathing in supine. Pt tolerated the abdominal sequence as established without increased pain. Pt continues to  benefit from MLD for enhancing gut motility and bowel function.  MLD is also known to facilitate the removal of inflammatory substances and toxins from the intestinal area, which may help reduce inflammation linked to digestive disorders. Cont  1 x weekly as per POC.  11/15/22 Initial  Evaluation: Anaria Kroner is a 53 y.o. female presenting with very mild, stage II, LLE lymphedema 2/2 venous insufficiency and obesity. L Leg swelling and associated pain swelling fluctuates. It has progressed over time, and now no longer resolves with elevation over night. RLE swelling limits balance during functional ambulation. It exacerbates infection and falls risk. LLE/RLQ lymphedema interferes with functional performance in all occupational domains, including basic and instrumental ADLs, productive activities, leisure pursuits and social participation. Pt will benefit from Occupational Therapy for Complete Decongestive Therapy (CDT) to restore function, to reduce physical and psychologic suffering associated with chronic, progressive lymphedema and associated pain, and to limit infection. CDT will include manual lymphatic drainage (MLD), skin care, therapeutic exercise and compression wraps and garment's devices. Without skilled OT for lymphedema care the condition will worsen and further functional decline is expected  Custom-made gradient compression garments and HOS devices are medically necessary because they are uniquely sized and shaped to fit the exact dimensions of the affected extremities, and to provide appropriate medical grade, graduated compression essential for optimally managing chronic, progressive lymphedema. Multiple custom compression garments are needed to ensure proper hygiene to limit infection risk. Custom compression garments should be replaced q 3-6 months When  worn consistently for optimal lipo-lymphedema self-management over time. HOS devices, medically necessary to limit fibrosis buildup in tissue, should be replaced q 2 years and PRN when worn out.      OBJECTIVE IMPAIRMENTS: decreased activity tolerance, decreased knowledge of condition, decreased knowledge of use of DME, increased edema, impaired sensation, pain, and chronic, progressive leg swelling.   ACTIVITY  LIMITATIONS: dependent sitting, extended standing and/ or walking, squatting, and lower body dressing, fitting preferred street shoes  PARTICIPATION LIMITATIONS: shopping, community activity, occupation, and yard work  PERSONAL FACTORS: 3+ comorbidities: Hx DVT,  OSA, CVI. Varicose veins are also affecting patient's functional outcome.   REHAB POTENTIAL: Good  EVALUATION COMPLEXITY: Moderate   GOALS: Goals reviewed with patient? Yes  SHORT TERM GOALS: Target date: 4th OT Rx visit  SHORT TERM GOALS: Target date: 4th OT Rx visit   Pt will demonstrate understanding of lymphedema precautions and prevention strategies with modified independence using a printed reference to identify at least 5 precautions and discussing how s/he may implement them into daily life to reduce risk of progression with extra time. Baseline:Max A Goal status: GOAL MET   Pt will be able to apply multilayer, knee length, gradient, compression wraps to one leg at a time with modified assistance (extra time and assistive device/s) to decrease limb volume, to limit infection risk, and to limit lymphedema progression.  Baseline: Dependent Goal status: GOAL DISCHARGED. Pt Going to compression garments instead  LONG TERM GOALS: Target date: 02/08/23  Given this patient's Intake score of 67%/100% on the functional outcomes FOTO tool, patient will experience an increase in function of 5 points to improve basic and instrumental ADLs performance, including lymphedema self-care.  Baseline: Max A Goal status:GOAL DISCHARGED.  FOTO TOOL DISCONTINUED AT CLINIC   Given this patient's Intake score of 50% on the Lymphedema Life Impact Scale (LLIS), patient will experience a reduction of at least 5 points in her perceived level of functional impairment resulting from lymphedema to improve functional performance and quality of life (QOL). Baseline: 50% Goal status: PROGRESSING  Pt will achieve at least a 10% volume reduction in B  legs to return limb to typical size and shape, to limit infection risk and LE progression, to decrease pain, to improve function. Baseline: Dependent Goal status: PROGRESSING  4.  Pt will obtain proper compression garments/devices and achieve modified independence (extra time + assistive devices) with donning/doffing to optimize limb volume reductions and limit LE  progression over time. Baseline:  Goal status:GOAL MET  5.  During Intensive phase CDT , with modified independence, Pt will achieve at least 85% compliance with all lymphedema self-care home program components, including daily skin care, compression wraps and /or garments, simple self MLD and lymphatic pumping therex to habituate LE self care protocol  into ADLs for optimal LE self-management over time. Baseline: Dependent Goal status:GOAL MET   PLAN:  OT FREQUENCY: 1 /week  OT DURATION: 12 weeks and PRN  PLANNED INTERVENTIONS: 97110-Therapeutic exercises, 97530- Therapeutic activity, 97535- Self Care, [id number]- Manual therapy, Patient/Family education, Manual lymph drainage, Compression bandaging, and fit with appropriate compression garments  PLAN FOR NEXT SESSION:  Cont MLD as established Pt and family edu for LE self-care  Zebedee Dec, MS, OTR/L, CLT-LANA 09/05/23 11:42 AM

## 2023-09-06 ENCOUNTER — Ambulatory Visit: Payer: Self-pay | Attending: Obstetrics & Gynecology

## 2023-09-06 ENCOUNTER — Other Ambulatory Visit (HOSPITAL_COMMUNITY): Payer: Self-pay

## 2023-09-06 ENCOUNTER — Ambulatory Visit: Admitting: Physical Therapy

## 2023-09-06 DIAGNOSIS — M6281 Muscle weakness (generalized): Secondary | ICD-10-CM | POA: Diagnosis not present

## 2023-09-06 DIAGNOSIS — R279 Unspecified lack of coordination: Secondary | ICD-10-CM | POA: Insufficient documentation

## 2023-09-06 DIAGNOSIS — R102 Pelvic and perineal pain: Secondary | ICD-10-CM | POA: Diagnosis not present

## 2023-09-06 DIAGNOSIS — M5459 Other low back pain: Secondary | ICD-10-CM | POA: Diagnosis not present

## 2023-09-06 DIAGNOSIS — M62838 Other muscle spasm: Secondary | ICD-10-CM | POA: Diagnosis not present

## 2023-09-06 NOTE — Therapy (Signed)
 OUTPATIENT PHYSICAL THERAPY FEMALE PELVIC TREATMENT   Patient Name: Karen [name] MRN: [id number] DOB:04-Apr-1970, 53 y.o., female Today's Date: 09/06/2023  END OF SESSION:  PT End of Session - 09/06/23 1232     Visit Number 7    Date for PT Re-Evaluation 10/03/23    Authorization Type [name]    Authorization Time Period signed dry needling consent form 08/08/23    PT Start Time 1230    PT Stop Time 1312    PT Time Calculation (min) 42 min    Activity Tolerance Patient tolerated treatment well    Behavior During Therapy [facility] for tasks assessed/performed                     Past Medical History:  Diagnosis Date   Adenomyosis    Anemia    Arthritis 2013   L knee gets steroid injections    Arthritis, rheumatic, acute or subacute    Asthmatic bronchitis 01/31/2017   Back pain    Chronic constipation    Diarrhea    DVT of lower extremity (deep venous thrombosis) (HCC)    Dysmenorrhea    Endometriosis    Esophageal stricture    G6PD deficiency    GERD (gastroesophageal reflux disease)    History of kidney stones    History of uterine fibroid    IBS (irritable bowel syndrome)    Interstitial cystitis    Lactose intolerance    Migraine    with aura   Other hemochromatosis 06/10/2021   PONV (postoperative nausea and vomiting)    Recurrent upper respiratory infection (URI)    Sebaceous cyst of breast, right lower inner quadrant 10/25/2012   Excised 11/21/12    Sleep apnea    Syncope, non cardiac    Past Surgical History:  Procedure Laterality Date   ARTHROSCOPIC HAGLUNDS REPAIR     BREAST CYST EXCISION Right 11/21/2012   Procedure: CYST EXCISION BREAST;  Surgeon: Sherlean JINNY Laughter, MD;  Location: [facility];  Service: General;  Laterality: Right;   BREAST CYST EXCISION Bilateral 01/18/2022   Procedure: EXCISION OF SEBACEOUS CYST BILATERAL BREAST;  Surgeon: Vanderbilt Ned, MD;  Location: [facility];  Service:  General;  Laterality: Bilateral;   BREAST EXCISIONAL BIOPSY Right 11/2012   CESAREAN SECTION  04/19/1993   CHOLECYSTECTOMY     COLONOSCOPY  05/02/2011   Procedure: COLONOSCOPY;  Surgeon: Lynwood LITTIE Celestia Mickey., MD;  Location: [facility];  Service: Endoscopy;  Laterality: N/A;   colonoscopy  11/04/2014   DENTAL SURGERY  03/06/2018   2 surgeries ( 03/06/2018 and 04/06/2018)   to remove two separate benign tumors   ESOPHAGEAL MANOMETRY N/A 11/04/2015   Procedure: ESOPHAGEAL MANOMETRY (EM);  Surgeon: Lynwood Celestia, MD;  Location: [facility];  Service: Endoscopy;  Laterality: N/A;   ESOPHAGOGASTRODUODENOSCOPY  05/02/2011   Procedure: ESOPHAGOGASTRODUODENOSCOPY (EGD);  Surgeon: Lynwood LITTIE Celestia Mickey., MD;  Location: [facility];  Service: Endoscopy;  Laterality: N/A;   ESOPHAGOGASTRODUODENOSCOPY N/A 09/01/2014   Procedure: ESOPHAGOGASTRODUODENOSCOPY (EGD);  Surgeon: Lynwood Celestia, MD;  Location: [facility];  Service: Endoscopy;  Laterality: N/A;   IR IMAGING GUIDED PORT INSERTION  07/06/2023   KNEE SURGERY     LAPAROSCOPY     x 4   LESION REMOVAL N/A 01/18/2022   Procedure: EXCISION OF SEBACEOUS CYSTS CHEST AND NECK;  Surgeon: Vanderbilt Ned, MD;  Location: [facility];  Service: General;  Laterality: N/A;   PH IMPEDANCE STUDY  N/A 11/04/2015   Procedure: PH IMPEDANCE STUDY;  Surgeon: Lynwood Bohr, MD;  Location: [facility];  Service: Endoscopy;  Laterality: N/A;   VAGINAL HYSTERECTOMY  2010   TVH--ovaries remain   Patient Active Problem List   Diagnosis Date Noted   Sjogren syndrome with keratoconjunctivitis (HCC) 12/09/2022   Other hemochromatosis 06/10/2021   Elevated LFTs 04/04/2021   Colitis 04/04/2021   Bacterial overgrowth syndrome 05/21/2020   Obesity 05/21/2020   History of endometriosis 05/21/2020   Constipation due to outlet dysfunction 02/05/2020   Gastroesophageal reflux disease 02/05/2020   Obstructive sleep apnea treated with continuous positive airway  pressure (CPAP) 06/16/2017   Perennial allergic rhinitis with a nonallergic component 01/31/2017   Asthmatic bronchitis 01/31/2017   GI symptoms 01/31/2017   Family history of colon cancer 01/27/2015   Chest pain 12/13/2012   Dyspnea 12/13/2012   DVT of lower extremity (deep venous thrombosis) (HCC) 01/03/1990    PCP: Elliot Charm, MD  REFERRING PROVIDER: Cleotilde Ronal RAMAN, MD   REFERRING DIAG: M62.89 (ICD-10-CM) - Pelvic floor dysfunction  THERAPY DIAG:  Muscle weakness (generalized)  Other muscle spasm  Other low back pain  Pelvic pain  Unspecified lack of coordination  Rationale for Evaluation and Treatment: Rehabilitation  ONSET DATE: chronic  SUBJECTIVE:                                                                                                                                                                                           SUBJECTIVE STATEMENT:  Pt is having some swelling in her port and is going to see doctor Friday; she did have embolism ruled out over the weekend. She was able to walk 4x last week. She is having 2 bowel movements a day that are diarrhea.   PAIN: 09/06/23 Are you having pain? Yes NPRS scale: 4/10 Pain location: Lower abdominal pain, Lt sided pain, low back pain  Pain type: turning, knotted, pressure Pain description: intermittent and constant   Aggravating factors: constipation or frequent bowel movements/constantly having bowel movements  Relieving factors: exercises (bowel massage, happy baby, spinal twists, walking)  PRECAUTIONS: None  WEIGHT BEARING RESTRICTIONS: No  FALLS:  Has patient fallen in last 6 months? No  LIVING ENVIRONMENT: Lives with: lives with their spouse Lives in: House/apartment  OCCUPATION: nurse, in admin  PLOF: Independent  PATIENT GOALS: decrease pain and have more normal bowel movements  PERTINENT HISTORY:  Vaginal hysterectomy, DVT LE, endometriosis, interstitial cystitis,  exploratory laps for endometriosis, c-section, cholecystectomy, RA diagnosed 06/2023 Sexual abuse: No  BOWEL MOVEMENT: Pain with bowel movement: Yes Type of bowel movement:Type (Bristol Stool Scale) 1-7 (sometimes full  range in the same bowel movement), Frequency sometimes many times a day, sometimes up to 4 days in between, and Strain Yes Fully empty rectum: No Leakage: No Pads: No Fiber supplement: No - has attempted metamucil and it made constipation worse  URINATION: Pain with urination: Yes Fully empty bladder: No Stream: varies  Urgency: Yes: all the time Frequency: multiple times an hours  Leakage: Urge to void, Walking to the bathroom, Coughing, Sneezing, Laughing, and Exercise Pads: Yes: daily  INTERCOURSE: Pain with intercourse: Initial Penetration and During Penetration Ability to have vaginal penetration:  Yes: with pain Climax: non painful WNL   PREGNANCY: Vaginal deliveries 1 Tearing Yes: 3rd degree tear C-section deliveries 1 Currently pregnant No  PROLAPSE: Periodically will feel vaginal bulging, heaviness in lower abdomen   OBJECTIVE:  05/04/23:               Internal Pelvic Floor: mild tenderness in bil levator ani, but no increase in tension; with internal rectal exam, she was demonstrating paradoxical contraction when trying to eccentrically lengthen with exhale and had very hard time correcting  Patient confirms identification and approves PT to assess internal pelvic floor and treatment Yes  PELVIC MMT:   MMT eval  Vaginal 2/5, 3 second hold, 3 repeat contractions  Internal Anal Sphincter 1/5  External Anal Sphincter 2/5  Puborectalis 1/5  Diastasis Recti   (Blank rows = not tested)        TONE: WNL   PROLAPSE: Grade 2 anterior vaginal wall laxity  04/25/23: Curl-up test: abdominal distortion Transversus abdominus: week, difficulty getting active contraction (not been working on strengthening)    LUMBARAROM/PROM:  A/PROM A/PROM  Eval  (% available) 11/09/22 (% available) 04/25/23 (% available)  Flexion 50 90 90  Extension 25, pain anteriorly  25, pain in low back 50  Right lateral flexion 50, pain anteriorly  75, pain on right 50, pain on Rt  Left lateral flexion 50, pain anteriorly  75, pain on right 75   (Blank rows = not tested)  11/09/22: No internal rectal or vaginal pelvic floor exam performed due to high level of skin irritation   Weak transversus abdominus contraction  Abdominal restriction and tenderness in bil lower quadrant  Unable to perform pelvic tilt with appropriate coordination  LUMBARAROM/PROM:  A/PROM A/PROM  Eval (% available) 11/09/22 (% available)  Flexion 50 90  Extension 25, pain anteriorly  25, pain in low back  Right lateral flexion 50, pain anteriorly  75, pain on right  Left lateral flexion 50, pain anteriorly  75, pain on right   (Blank rows = not tested) 07/21/22: standing prolapse assessment demonstrated grade 2 anterior vaginal wall laxity   06/08/22:               External Perineal Exam: WNL                              Internal Pelvic Floor: burning reported with palpation of superficial muscles; deep aching/pressure with palpation of Lt levator ani   Patient confirms identification and approves PT to assess internal pelvic floor and treatment Yes  PELVIC MMT:   MMT eval  Vaginal 3/5  Internal Anal Sphincter 1/5  External Anal Sphincter 2/5  Puborectalis 1/5  Diastasis Recti   (Blank rows = not tested)        TONE: High in Lt levator ani   PROLAPSE: Not able to tell this session due  to dyssynergic pelvic floor contraction     06/01/22: COGNITION: Overall cognitive status: Within functional limits for tasks assessed     SENSATION: Light touch: Appears intact Proprioception: Appears intact  GAIT: Comments: decreased hip extension, forward flexed trunk  POSTURE: rounded shoulders, forward head, decreased lumbar lordosis, increased thoracic kyphosis, posterior  pelvic tilt, and flexed trunk    LUMBARAROM/PROM:  A/PROM A/PROM  Eval (% available)  Flexion 50  Extension 25, pain anteriorly   Right lateral flexion 50, pain anteriorly   Left lateral flexion 50, pain anteriorly    (Blank rows = not tested)   PALPATION:   General  Significant abdominal restriction and tenderness; decreased rib mobility with mobilization and breathing    TODAY'S TREATMENT:                                                                                                                              DATE:  09/06/23 Manual: ILU bowel mobilization  Abdominal scar tissue mobilization Ileocecal valve mobilization External rectum mobilization Myofascial release to abdomen with push/pull alternating forces horizontally  Diaphragm release All techniques performed in supine  Neuromuscular re-education: Supine horizontal abduction/extension + red band 2 x 10 Supine unilateral bent knee fall out + green band 10x bil Exercises: Sidelying clam shells 2 x 10 bil Sidelying reverse clam shells 2 x 10 bil Supine bridge + hip adduction 2 x 10   08/30/23 Manual: ILU bowel mobilization  Abdominal scar tissue mobilization Ileocecal valve mobilization External rectum mobilization Myofascial release to abdomen with push/pull alternating forces horizontally  Diaphragm release All techniques performed in supine  Therapeutic activities: Tracking fiber to make sure she is getting 8-12g with each meal    08/22/23 Manual: ILU bowel mobilization  Abdominal scar tissue mobilization Ileocecal valve mobilization External rectum mobilization Myofascial release to abdomen with push/pull alternating forces horizontally  Exercises: Seated side bending stretch over bolster 10 breaths bil Seated thoracic openers 6x bil Seated forward fold 10 breaths  Seated thoracic extensions over foam roller 10x   PATIENT EDUCATION:  Education details: See above Person educated:  Patient Education method: Programmer, multimedia, Demonstration, Tactile cues, Verbal cues, and Handouts Education comprehension: verbalized understanding  HOME EXERCISE PROGRAM: [id number]  ASSESSMENT:  CLINICAL IMPRESSION: Pt doing better with bowel movements only having 2 daily even though they are more like diarrhea in consistency. She did well with all exercises today and we continued manual techniques with good tolerance. She is doing very well with proprioception of core activation and breath coordination. Believe adding in fiber will be very helpful as she stays consistent with Miralax , but she is waiting to talk with nutritionist Friday. She will continue to benefit from skilled PT intervention in order to decrease pain, improve bowel movements, and decrease urinary urgency/incontinence.     OBJECTIVE IMPAIRMENTS: decreased activity tolerance, decreased coordination, decreased endurance, decreased mobility, decreased strength, increased fascial restrictions, increased muscle spasms, impaired tone, postural dysfunction, and pain.  ACTIVITY LIMITATIONS: lifting, bending, continence, and locomotion level  PARTICIPATION LIMITATIONS: interpersonal relationship, community activity, and occupation  PERSONAL FACTORS: 1 comorbidity: medical history are also affecting patient's functional outcome.   REHAB POTENTIAL: Good  CLINICAL DECISION MAKING: Stable/uncomplicated  EVALUATION COMPLEXITY: Low   GOALS: Goals reviewed with patient? Yes  SHORT TERM GOALS: Updated 08/01/23  Pt will be independent with HEP.   Baseline: Goal status: MET 07/12/22  2.  Pt will be independent with diaphragmatic breathing and down training activities in order to improve pelvic floor relaxation.  Baseline:  Goal status: MET 07/12/22  3.  Pt will be independent with the knack, urge suppression technique, and double voiding in order to improve bladder habits and decrease urinary incontinence.   Baseline:  Goal  status: MET 09/22/22  4.  Pt will be independent with use of squatty potty, relaxed toileting mechanics, and improved bowel movement techniques in order to increase ease of bowel movements and complete evacuation.   Baseline:  Goal status: MET 11/09/22  5.  Pt will be able to correctly perform diaphragmatic breathing and appropriate pressure management in order to prevent worsening vaginal wall laxity and improve pelvic floor A/ROM.   Baseline:  Goal status: MET 01/18/23  LONG TERM GOALS: Updated 08/22/23  Pt will be independent with advanced HEP.   Baseline:  Goal status: IN PROGRESS 08/22/23  2.  Pt will demonstrate normal pelvic floor muscle tone and A/ROM, able to achieve 4/5 strength with contractions and 10 sec endurance, in order to provide appropriate lumbopelvic support in functional activities.   Baseline: not assessed today per patient request  Goal status: IN PROGRESS 08/22/23  3.  Pt will increase all impaired lumbar A/ROM by 25% without pain.  Baseline: Pt seen a little bit of loss in some and improvement in extension Goal status:  IN PROGRESS 08/22/23  4.  Pt will report pain no higher than 3/10 with any activity. Baseline: pain has gotten up to 5/10, but currently at a 3/10 Goal status:  IN PROGRESS 08/22/23  5.  Pt will report 0/10 pain with vaginal penetration in order to improve intimate relationship with partner.    Baseline: no recent intercourse attempts  Goal status:  IN PROGRESS 08/22/23  6.  Pt will be able to go 2-3 hours in between voids without urgency or incontinence in order to improve QOL and perform all functional activities with less difficulty.   Baseline:  Goal status: MET 02/21/23  7.  Pt will report no leaks with laughing, coughing, sneezing in order to improve comfort with interpersonal relationships and community activities.   Baseline: Pt states that she feels 30-40% better Goal status:  IN PROGRESS 08/22/23  8.  Pt will have consistent  bowel movements 4-5x/week without straining or pain.  Baseline:  Goal status:  MET 02/21/23  PLAN:  PT FREQUENCY: 1-2x/week  PT DURATION: 6 months  PLANNED INTERVENTIONS: Therapeutic exercises, Therapeutic activity, Neuromuscular re-education, Balance training, Gait training, Patient/Family education, Self Care, Joint mobilization, Dry Needling, Biofeedback, and Manual therapy  PLAN FOR NEXT SESSION: strengthening program - reinforce importance and go over some exercises each session; internal pelvic floor muscle exam  Josette Mares, PT, DPT09/03/2510:35 PM

## 2023-09-07 ENCOUNTER — Encounter: Payer: Self-pay | Admitting: Physical Therapy

## 2023-09-07 ENCOUNTER — Ambulatory Visit: Attending: Orthopedic Surgery | Admitting: Physical Therapy

## 2023-09-07 ENCOUNTER — Ambulatory Visit: Attending: Surgery | Admitting: Occupational Therapy

## 2023-09-07 DIAGNOSIS — M62838 Other muscle spasm: Secondary | ICD-10-CM | POA: Diagnosis not present

## 2023-09-07 DIAGNOSIS — S46812A Strain of other muscles, fascia and tendons at shoulder and upper arm level, left arm, initial encounter: Secondary | ICD-10-CM | POA: Diagnosis not present

## 2023-09-07 DIAGNOSIS — M6281 Muscle weakness (generalized): Secondary | ICD-10-CM | POA: Insufficient documentation

## 2023-09-07 DIAGNOSIS — M79641 Pain in right hand: Secondary | ICD-10-CM | POA: Diagnosis not present

## 2023-09-07 DIAGNOSIS — M542 Cervicalgia: Secondary | ICD-10-CM | POA: Diagnosis not present

## 2023-09-07 NOTE — Therapy (Signed)
 OUTPATIENT PHYSICAL THERAPY CERVICAL TREATMENT   Patient Name: Karen [name] MRN: [id number] DOB:23-Jul-1970, 53 y.o., female Today's Date: 09/07/2023  END OF SESSION:  PT End of Session - 09/07/23 1243     Visit Number 6   ortho count 6   Date for PT Re-Evaluation 10/03/23    Authorization Type [name]    Authorization Time Period signed dry needling consent form 08/08/23    PT Start Time 1104    PT Stop Time 1144    PT Time Calculation (min) 40 min    Activity Tolerance Patient tolerated treatment well    Behavior During Therapy [facility] for tasks assessed/performed              Past Medical History:  Diagnosis Date   Adenomyosis    Anemia    Arthritis 2013   L knee gets steroid injections    Arthritis, rheumatic, acute or subacute    Asthmatic bronchitis 01/31/2017   Back pain    Chronic constipation    Diarrhea    DVT of lower extremity (deep venous thrombosis) (HCC)    Dysmenorrhea    Endometriosis    Esophageal stricture    G6PD deficiency    GERD (gastroesophageal reflux disease)    History of kidney stones    History of uterine fibroid    IBS (irritable bowel syndrome)    Interstitial cystitis    Lactose intolerance    Migraine    with aura   Other hemochromatosis 06/10/2021   PONV (postoperative nausea and vomiting)    Recurrent upper respiratory infection (URI)    Sebaceous cyst of breast, right lower inner quadrant 10/25/2012   Excised 11/21/12    Sleep apnea    Syncope, non cardiac    Past Surgical History:  Procedure Laterality Date   ARTHROSCOPIC HAGLUNDS REPAIR     BREAST CYST EXCISION Right 11/21/2012   Procedure: CYST EXCISION BREAST;  Surgeon: Sherlean JINNY Laughter, MD;  Location: [facility];  Service: General;  Laterality: Right;   BREAST CYST EXCISION Bilateral 01/18/2022   Procedure: EXCISION OF SEBACEOUS CYST BILATERAL BREAST;  Surgeon: Vanderbilt Ned, MD;  Location: [facility];  Service: General;   Laterality: Bilateral;   BREAST EXCISIONAL BIOPSY Right 11/2012   CESAREAN SECTION  04/19/1993   CHOLECYSTECTOMY     COLONOSCOPY  05/02/2011   Procedure: COLONOSCOPY;  Surgeon: Lynwood LITTIE Celestia Mickey., MD;  Location: [facility];  Service: Endoscopy;  Laterality: N/A;   colonoscopy  11/04/2014   DENTAL SURGERY  03/06/2018   2 surgeries ( 03/06/2018 and 04/06/2018)   to remove two separate benign tumors   ESOPHAGEAL MANOMETRY N/A 11/04/2015   Procedure: ESOPHAGEAL MANOMETRY (EM);  Surgeon: Lynwood Celestia, MD;  Location: [facility];  Service: Endoscopy;  Laterality: N/A;   ESOPHAGOGASTRODUODENOSCOPY  05/02/2011   Procedure: ESOPHAGOGASTRODUODENOSCOPY (EGD);  Surgeon: Lynwood LITTIE Celestia Mickey., MD;  Location: [facility];  Service: Endoscopy;  Laterality: N/A;   ESOPHAGOGASTRODUODENOSCOPY N/A 09/01/2014   Procedure: ESOPHAGOGASTRODUODENOSCOPY (EGD);  Surgeon: Lynwood Celestia, MD;  Location: [facility];  Service: Endoscopy;  Laterality: N/A;   IR IMAGING GUIDED PORT INSERTION  07/06/2023   KNEE SURGERY     LAPAROSCOPY     x 4   LESION REMOVAL N/A 01/18/2022   Procedure: EXCISION OF SEBACEOUS CYSTS CHEST AND NECK;  Surgeon: Vanderbilt Ned, MD;  Location:  S[facility];  Service: General;  Laterality: N/A;   PH IMPEDANCE STUDY N/A 11/04/2015  Procedure: PH IMPEDANCE STUDY;  Surgeon: Lynwood Bohr, MD;  Location: [facility];  Service: Endoscopy;  Laterality: N/A;   VAGINAL HYSTERECTOMY  2010   TVH--ovaries remain   Patient Active Problem List   Diagnosis Date Noted   Sjogren syndrome with keratoconjunctivitis (HCC) 12/09/2022   Other hemochromatosis 06/10/2021   Elevated LFTs 04/04/2021   Colitis 04/04/2021   Bacterial overgrowth syndrome 05/21/2020   Obesity 05/21/2020   History of endometriosis 05/21/2020   Constipation due to outlet dysfunction 02/05/2020   Gastroesophageal reflux disease 02/05/2020   Obstructive sleep apnea treated with continuous positive airway pressure (CPAP)  06/16/2017   Perennial allergic rhinitis with a nonallergic component 01/31/2017   Asthmatic bronchitis 01/31/2017   GI symptoms 01/31/2017   Family history of colon cancer 01/27/2015   Chest pain 12/13/2012   Dyspnea 12/13/2012   DVT of lower extremity (deep venous thrombosis) (HCC) 01/03/1990    PCP: Elliot Charm, MD   REFERRING PROVIDER: Huey Redell Fallow, PA-C  REFERRING DIAG: M54.2 (ICD-10-CM) - Cervicalgia  THERAPY DIAG:  Muscle weakness (generalized)  Other muscle spasm  Strain of left trapezius muscle, initial encounter  Cervicalgia  Rationale for Evaluation and Treatment: Rehabilitation  ONSET DATE: July 2025  SUBJECTIVE:                                                                                                                                                                                                         SUBJECTIVE STATEMENT: Patient presents verbalizing increased swelling around her port area. She has been to the ED and has a follow up appointment tomorrow with her provider.   Eval: Reports onset of L UT pain following port placement into R shoulder Hand dominance: Right  PERTINENT HISTORY:  07/21/2023 Assessment: - suspect left-sided cervical/ trapezius muscle spasm - reassured by absence of red flag signs/ symptoms at this time   Concurrent lymphedema PAIN: 08/29/23 Are you having pain? Yes: NPRS scale: 5/10 Pain location: L UT Pain description: ache, cramp Aggravating factors: activity Relieving factors: heat, medication  PRECAUTIONS: None  RED FLAGS: None     WEIGHT BEARING RESTRICTIONS: No  FALLS:  Has patient fallen in last 6 months? No  OCCUPATION: [employer] Employee  PLOF: Independent  PATIENT GOALS: To manage my neck pain  NEXT MD VISIT: TBD  OBJECTIVE:  Note: Objective measures were completed at Evaluation unless otherwise noted.  DIAGNOSTIC FINDINGS:  none  PATIENT SURVEYS:  NDI: 28/50 56%  perceived disability  Minimum Detectable Change (90% confidence): 5 points or 10% points   POSTURE: rounded  shoulders and forward head  PALPATION: Global tenderness throughout B UT and posterior shoulder girdle   CERVICAL ROM:   Active ROM A/PROM (deg) eval  Flexion 75%P!  Extension 75%P!  Right lateral flexion 75%P!  Left lateral flexion 75%P!  Right rotation 75%P!  Left rotation 75%P!   (Blank rows = not tested)  UPPER EXTREMITY ROM:  Active ROM Right eval Left eval  Shoulder flexion 150 150  Shoulder extension    Shoulder abduction 150 150  Shoulder adduction    Shoulder extension    Shoulder internal rotation    Shoulder external rotation    Elbow flexion    Elbow extension    Wrist flexion    Wrist extension    Wrist ulnar deviation    Wrist radial deviation    Wrist pronation    Wrist supination     (Blank rows = not tested)  UPPER EXTREMITY MMT:  MMT Right eval Left eval  Shoulder flexion    Shoulder extension    Shoulder abduction    Shoulder adduction    Shoulder extension    Shoulder internal rotation    Shoulder external rotation    Middle trapezius    Lower trapezius    Elbow flexion    Elbow extension    Wrist flexion    Wrist extension    Wrist ulnar deviation    Wrist radial deviation    Wrist pronation    Wrist supination    Grip strength     (Blank rows = not tested)  CERVICAL SPECIAL TESTS:  Neck flexor muscle endurance test: Negative  FUNCTIONAL TESTS:  N/A  TREATMENT                     09/07/2023 Manual: seated soft tissue mobilization to bilateral upper trap, levator scap, bilateral cervical paraspinals and bilateral subocciptals Shoulder circles x 10 each direction Chin tucks x 10 Seated scap squeeze x 10  Pool noodle between shoulder blades + shoulder flexion x 10 Cervical Melt method with foam roll (flexion/extension and rotation) x 20 each direction Cervical chin tuck isometric 2  x 10 3 sec hold Supine  shoulder extension isometric 2 x 10 3 sec hold    08/29/2023 Discussion of current status and response to treatment  Manual therapy: supine soft tissue mobilization to left upper trap, levator scap, left cervical paraspinals and left subocciptals Education on benefits of meditation for muscle relaxation as well as deep breathing Awareness of shoulder hiking as a stress response Use of Theracane for trigger point release to left upper trap and levator Back to wall with lacrosse ball pinned to the wall:  cervical rotation right/left, rotation/lateral flexion, left UE elevation, left horizontal adduction 5x each                                                                                                      DATE: 08/23/2023 Review of chin tuck performance Seated upper trap stretch 2 x 20 seconds bil  Standing scap squeeze with small pool noodle x 12 Standing with  small pool noodle between shoulder blades + shoulder ER with red TB x 12 Standing with small pool noodle between shoulder blades + shoulder abduction with red TB x 12 Standing shoulder row & extension  with red TB 2 x 10 Cervical Melt method with foam roll (flexion/extension and rotation) x 20 each direction Supine LTR x 8 each side 5 sec hold Hooklying TA activation 2 x 10 Hooklying TA + ball squeeze 2 x 10 TA activation + SLR x 8 bilateral  Manual: therapy rolling to left quads and adductors for improved muscle elongation and decreased cramps Demonstrated standing and sitting quad and hamstring stretch for patient to perform after her walks    08/16/2023 Updated on status and Pain Seated scap squeeze x 12 Seated upper trap stretch 2 x 30 seconds bil (verbal cues for correct performance) Seated levator stretch 2 x 20 seconds bil Seated chin tucks 2 x 10 Standing shoulder row & extension  with red TB 2 x 10 Standing shoulder abduction with red TB 2 x 10 Open Books at wall x 5 each direction Blue stability ball stretch  (forwards) x 8 Manual: STM to bilateral upper trap and cervical paraspinals for decreased pain and improved muscle length   08/08/23 Trigger Point Dry Needling  Initial Treatment: Pt instructed on Dry Needling rational, procedures, and possible side effects. Pt instructed to expect mild to moderate muscle soreness later in the day and/or into the next day.  Pt instructed in methods to reduce muscle soreness. Pt instructed to continue prescribed HEP. Patient was educated on signs and symptoms of infection and other risk factors and advised to seek medical attention should they occur.  Patient verbalized understanding of these instructions and education.   Patient Verbal Consent Given: Yes Education Handout Provided: Yes Muscles Treated: Bil upper traps and levator scapulae  Electrical Stimulation Performed: No Treatment Response/Outcome: significant twitch response and palpable improvement in muscle restriction and trigger points; painful response from patient  Manual: Soft tissue mobilization to bil upper traps, cervical paraspinals, levator, and suboccipitals in prone  Exercises: Seated scapular retraction 10x Seated shoulder rolls 10x Seated shoulder shrugs 10x Seated upper trap stretch 2 x 30 seconds bil Seated levator stretch 2 x 20 seconds bil Therapeutic activities: Self massage with tennis ball in pillow case leaning up against wall to help reduce scapular restriction      PATIENT EDUCATION:  Education details: Discussed eval findings, rehab rationale and POC and patient is in agreement  Person educated: Patient Education method: Explanation and Handouts Education comprehension: verbalized understanding and needs further education  HOME EXERCISE PROGRAM: Access Code: [email or web address] URL: [email or web address] Date: 08/23/2023 Prepared by: Kristeen Sar  Exercises - Seated Shoulder Shrugs  - 2 x daily - 5 x weekly - 2 sets - 10 reps - Seated Scapular  Retraction  - 2 x daily - 5 x weekly - 2 sets - 10 reps - Gentle Levator Scapulae Stretch  - 1 x daily - 7 x weekly - 1 sets - 2 reps - 30 seconds hold - Seated Cervical Sidebending Stretch  - 1 x daily - 7 x weekly - 1 sets - 2 reps - 30 seconds hold - Quadricep Stretch with Chair and Counter Support  - 1 x daily - 7 x weekly - 2 sets - 20-30s hold - Standing Hamstring Stretch with Step  - 1 x daily - 7 x weekly - 2 sets - 20-30s hold ASSESSMENT:  CLINICAL IMPRESSION: Treatment session focused on targeting  trigger points in cervical spine and incorporating gentle isometric strengthening exercises. Manual soft tissue techniques continue to be successful at targeting trigger points. Patient tolerated gentle cervical and postural isometric exercises well. Patient will benefit from skilled PT to address the below impairments and improve overall function.    OBJECTIVE IMPAIRMENTS: decreased activity tolerance, decreased mobility, decreased ROM, increased fascial restrictions, increased muscle spasms, impaired UE functional use, postural dysfunction, and pain.   ACTIVITY LIMITATIONS: carrying, lifting, bending, bed mobility, and reach over head  PERSONAL FACTORS: Age, Fitness, Past/current experiences, and 1 comorbidity: lymphedema are also affecting patient's functional outcome.   REHAB POTENTIAL: Good  CLINICAL DECISION MAKING: Stable/uncomplicated  EVALUATION COMPLEXITY: Low   GOALS: Goals reviewed with patient? No    SHORT TERM GOALS=LONG TERM GOALS: Target date: 10/03/23  Patient to demonstrate independence in HEP  Baseline: [email or web address] Goal status: INITIAL  2.  Patient will acknowledge 4/10 pain at least once during episode of care   Baseline: 8/10 Goal status: INITIAL  3.  Patient will score at least 20/50 on NDI to signify clinically meaningful improvement in functional abilities.   Baseline: 28/50 Goal status: INITIAL  4.  160d AROM in B shoulder  flexion/abduction Baseline: 150d Goal status: INITIAL  5.  75% AROM cervical spine with minimal pain Baseline: 75% with moderate pain Goal status: INITIAL   PLAN:  PT FREQUENCY: 1-2x/week  PT DURATION: 6 weeks  PLANNED INTERVENTIONS: 97110-Therapeutic exercises, 97530- Therapeutic activity, 97112- Neuromuscular re-education, 97535- Self Care, and [id number]- Manual therapy  PLAN FOR NEXT SESSION:  assess swelling; postural strengthening; manual techniques as appropriate, aerobic tasks, ROM and flexibility activities, strengthening and PREs, TPDN  Kristeen Sar, PT 09/07/23 12:44 PM

## 2023-09-08 ENCOUNTER — Inpatient Hospital Stay

## 2023-09-08 ENCOUNTER — Encounter: Admitting: Occupational Therapy

## 2023-09-08 ENCOUNTER — Other Ambulatory Visit: Payer: Self-pay | Admitting: Family

## 2023-09-08 ENCOUNTER — Other Ambulatory Visit: Payer: Self-pay

## 2023-09-08 DIAGNOSIS — Z6834 Body mass index (BMI) 34.0-34.9, adult: Secondary | ICD-10-CM | POA: Diagnosis not present

## 2023-09-08 DIAGNOSIS — Z713 Dietary counseling and surveillance: Secondary | ICD-10-CM | POA: Diagnosis not present

## 2023-09-08 DIAGNOSIS — Z95828 Presence of other vascular implants and grafts: Secondary | ICD-10-CM

## 2023-09-08 DIAGNOSIS — E669 Obesity, unspecified: Secondary | ICD-10-CM | POA: Diagnosis not present

## 2023-09-08 DIAGNOSIS — K582 Mixed irritable bowel syndrome: Secondary | ICD-10-CM | POA: Diagnosis not present

## 2023-09-08 DIAGNOSIS — K5900 Constipation, unspecified: Secondary | ICD-10-CM | POA: Diagnosis not present

## 2023-09-08 NOTE — Progress Notes (Signed)
 Specialty Pharmacy Refill Coordination Note  Karen [name] is a 53 y.o. female contacted today regarding refills of specialty medication(s) Adalimumab  (Humira  (2 Pen))   Patient requested Pickup at [facility] at [place] date: 09/11/23   Medication will be filled on 09/08/23.

## 2023-09-08 NOTE — Progress Notes (Signed)
 Call placed to patient to notify her that she does not need to come in today for port flush evaluation and that Dr Timmy would like for her to have a dye study to evaluate her port. IR notified and order placed.  Pt agrees to dye study and is scheduled for 09/13/23 at 3pm.  Pt is appreciative of assistance and has no further questions at this time.

## 2023-09-11 ENCOUNTER — Ambulatory Visit: Admitting: Occupational Therapy

## 2023-09-11 DIAGNOSIS — I890 Lymphedema, not elsewhere classified: Secondary | ICD-10-CM

## 2023-09-11 NOTE — Therapy (Signed)
 OUTPATIENT OCCUPATIONAL THERAPY TREATMENT NOTE  BLE/ BLQ LYMPHEDEMA  Patient Name: Karen [name] MRN: [id number] DOB:April 27, 1970, 53 y.o., female Today's Date: 09/11/2023  REPORTING PERIOD:  END OF SESSION:   OT End of Session - 09/11/23 1525     Visit Number 53    Number of Visits 72    Date for OT Re-Evaluation 11/16/23    OT Start Time 0200    OT Stop Time 0315    OT Time Calculation (min) 75 min    Activity Tolerance Patient tolerated treatment well;No increased pain    Behavior During Therapy [facility] for tasks assessed/performed            Past Medical History:  Diagnosis Date   Adenomyosis    Anemia    Arthritis 2013   L knee gets steroid injections    Arthritis, rheumatic, acute or subacute    Asthmatic bronchitis 01/31/2017   Back pain    Chronic constipation    Diarrhea    DVT of lower extremity (deep venous thrombosis) (HCC)    Dysmenorrhea    Endometriosis    Esophageal stricture    G6PD deficiency    GERD (gastroesophageal reflux disease)    History of kidney stones    History of uterine fibroid    IBS (irritable bowel syndrome)    Interstitial cystitis    Lactose intolerance    Migraine    with aura   Other hemochromatosis 06/10/2021   PONV (postoperative nausea and vomiting)    Recurrent upper respiratory infection (URI)    Sebaceous cyst of breast, right lower inner quadrant 10/25/2012   Excised 11/21/12    Sleep apnea    Syncope, non cardiac    Past Surgical History:  Procedure Laterality Date   ARTHROSCOPIC HAGLUNDS REPAIR     BREAST CYST EXCISION Right 11/21/2012   Procedure: CYST EXCISION BREAST;  Surgeon: Sherlean JINNY Laughter, MD;  Location: [facility];  Service: General;  Laterality: Right;   BREAST CYST EXCISION Bilateral 01/18/2022   Procedure: EXCISION OF SEBACEOUS CYST BILATERAL BREAST;  Surgeon: Vanderbilt Ned, MD;  Location: [facility];  Service: General;  Laterality: Bilateral;   BREAST EXCISIONAL  BIOPSY Right 11/2012   CESAREAN SECTION  04/19/1993   CHOLECYSTECTOMY     COLONOSCOPY  05/02/2011   Procedure: COLONOSCOPY;  Surgeon: Lynwood LITTIE Celestia Mickey., MD;  Location: [facility];  Service: Endoscopy;  Laterality: N/A;   colonoscopy  11/04/2014   DENTAL SURGERY  03/06/2018   2 surgeries ( 03/06/2018 and 04/06/2018)   to remove two separate benign tumors   ESOPHAGEAL MANOMETRY N/A 11/04/2015   Procedure: ESOPHAGEAL MANOMETRY (EM);  Surgeon: Lynwood Celestia, MD;  Location: [facility];  Service: Endoscopy;  Laterality: N/A;   ESOPHAGOGASTRODUODENOSCOPY  05/02/2011   Procedure: ESOPHAGOGASTRODUODENOSCOPY (EGD);  Surgeon: Lynwood LITTIE Celestia Mickey., MD;  Location: [facility];  Service: Endoscopy;  Laterality: N/A;   ESOPHAGOGASTRODUODENOSCOPY N/A 09/01/2014   Procedure: ESOPHAGOGASTRODUODENOSCOPY (EGD);  Surgeon: Lynwood Celestia, MD;  Location: [facility];  Service: Endoscopy;  Laterality: N/A;   IR IMAGING GUIDED PORT INSERTION  07/06/2023   KNEE SURGERY     LAPAROSCOPY     x 4   LESION REMOVAL N/A 01/18/2022   Procedure: EXCISION OF SEBACEOUS CYSTS CHEST AND NECK;  Surgeon: Vanderbilt Ned, MD;  Location: [facility];  Service: General;  Laterality: N/A;   PH IMPEDANCE STUDY N/A 11/04/2015   Procedure: PH IMPEDANCE STUDY;  Surgeon: Lynwood Celestia, MD;  Location: [facility];  Service: Endoscopy;  Laterality: N/A;   VAGINAL HYSTERECTOMY  2010   TVH--ovaries remain   Patient Active Problem List   Diagnosis Date Noted   Sjogren syndrome with keratoconjunctivitis (HCC) 12/09/2022   Other hemochromatosis 06/10/2021   Elevated LFTs 04/04/2021   Colitis 04/04/2021   Bacterial overgrowth syndrome 05/21/2020   Obesity 05/21/2020   History of endometriosis 05/21/2020   Constipation due to outlet dysfunction 02/05/2020   Gastroesophageal reflux disease 02/05/2020   Obstructive sleep apnea treated with continuous positive airway pressure (CPAP) 06/16/2017   Perennial allergic rhinitis  with a nonallergic component 01/31/2017   Asthmatic bronchitis 01/31/2017   GI symptoms 01/31/2017   Family history of colon cancer 01/27/2015   Chest pain 12/13/2012   Dyspnea 12/13/2012   DVT of lower extremity (deep venous thrombosis) (HCC) 01/03/1990    PCP: Gaile New, MD  REFERRING PROVIDER: Valery Ripple, MD  REFERRING DIAG: I89.0  THERAPY DIAG:  Lymphedema, not elsewhere classified  Rationale for Evaluation and Treatment: Rehabilitation  ONSET DATE: ~2014; exacerbation in 2023  SUBJECTIVE:                                                                                                                                                                                          SUBJECTIVE STATEMENT:  Pt presents for OT to address lower quadrant, including deep abdominal lymphatics, and LLE lymphedema w/ associated pain. Pt has no new complaints.  Pt rate pain/ discomfort in the abdomen at 4/10. She reports ongoing constipation alternating with diarrhea.  PERTINENT HISTORY: CVI, OSA (no CPAP), GERD, Esophageal stricture, IBS, Lactose intolerant, Diarrhea, Hx LE DVT, recent RA dx  PAIN:  Are you having pain? Yes: 4/10 Pain location: abdomen; digestive discomfort Pain description: abdominal fullness, tightness Aggravating factors: standing, walking,  Relieving factors: MLD, Miralax ,   PRECAUTIONS: Other: LYMPHEDEMA; Hx LLE DVT  WEIGHT BEARING RESTRICTIONS: No  FALLS:  Has patient fallen in last 6 months? No  LIVING ENVIRONMENT: Lives with: lives with spouse and daughter Lives in: House/apartment Stairs: Yes; Internal: 14 steps; yes and External: 4 steps; yes Has following equipment at home: None  OCCUPATION: [employer] operational officer, walking, desk work  LEISURE: family time  HAND DOMINANCE: right   PRIOR LEVEL OF FUNCTION: Independent  PATIENT GOALS: Feel better, be able to be more active without pain, wear preferred street shoes. NEW 08/03/23: Be able to  manage digestive discomfort and gut inflammation symptoms without medications  OBJECTIVE: Note: Objective measures were completed at Evaluation unless otherwise noted.  COGNITION:  Overall cognitive status: Within functional limits for tasks assessed   POSTURE: WFL  LE  ROM: WFL  LE MMT: WFL  LYMPHEDEMA ASSESSMENTS: non-Ca related  INFECTIONS: denies cellulitis and wound hx  BLE COMPARATIVE LIMB VOLUMETRICS Initial 11/15/22  LANDMARK RIGHT (dominant)  R LEG (A-D) 3028.9 ml  R THIGH (E-G) 4809.60 ml  R FULL LIMB (A-G) 7838.5 ml  Limb Volume differential (LVD)  %  Volume change since initial %  Volume change overall V  (Blank rows = not tested)  LANDMARK LEFT  (Rx)  L LEG (A-D) 3189.9 ml  L THIGH (E-G) 4959.8 ml  L FULL LIMB (A-G) 8149.7 ml  Limb Volume differential (LVD)  LEG LVD = 5.32%, L>R; THIGH LVD = 3.12%, L>R; And full LLE LVD = 3.97%, L>R.   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  Skin  Description Hyper-Keratosis Peau d' Orange Shiny Tight Fibrotic/ Indurated Fatty Doughy Spongy/ boggy       R>L x  x   Skin dry Flaky WNL Macerated   mildly      Color Redness Varicosities Blanching Hemosiderin Stain Mottled        x   Odor Malodorous Yeast Fungal infection  WNL      x   Temperature Warm Cool wnl     x    Pitting Edema   1+ 2+ 3+ 4+ Non-pitting         x   Girth Symmetrical Asymmetrical                   Distribution    L>R toes to groin    Stemmer Sign Positive Negative   +L -R   Lymphorrhea History Of:  Present Absent     x    Wounds  History Of Present Absent Venous Arterial Pressure Sheer     x        Signs of Infection Redness Warmth Erythema Acute Swelling Drainage Borders                    Sensation Light Touch Deep pressure Hypersensitivity   In tact Impaired In tact Impaired Absent Impaired   x  x  x     Nails WNL   Fungus nail dystrophy   x     Hair Growth Symmetrical Asymmetrical   x    Skin Creases Base of toes  Ankles   Base of Fingers knees       Abdominal pannus Thigh Lobules  Face/neck   x           GAIT: Distance walked: >500' Assistive device utilized: None Level of assistance: Complete Independence Comments: Functional ambulation and transfers [facility]  LYMPHEDEMA LIFE IMPACT SCALE (LLIS): Initial 11/15/22  50%  FOTO functional outcome measure: Deferred. FOTO discontinued.  TODAY'S TREATMENT:  MLD to abdominal lymphatics Pt edu for LE self care  PATIENT EDUCATION:  Continued Pt/ CG edu for how function of cisterna chyli, part of the thoracic duct of deep abdominal lymphatics, assists with digestion by filtering many of the fats and proteins from the digestive system. Sub optimal lymphatic flow in this region may result in constipation, bloating, swelling, etc. MLD helps mobilize these protein and fats , and the rhythmic movement of manual lymphatic drainage stimulates digestive muscles and increase peristalsis. Provided printed resource for reference.  Topics include outcome of comparative limb volumetrics- starting limb volume differentials (LVDs), technology and gradient techniques used for short stretch, multilayer compression wrapping, simple self-MLD, therapeutic lymphatic pumping exercises, skin/nail care, LE precautions,. compression garment recommendations and specifications, wear and care schedule and compression garment donning / doffing w  assistive devices. Discussed progress towards all OT goals since commencing CDT. All questions answered to the Pt's satisfaction. Good return. Person educated: Patient  Education method: Explanation, Demonstration, and Handouts Education comprehension: verbalized understanding, returned demonstration, verbal cues required, and needs further education  HOME EXERCISE PROGRAM: BLE lymphatic pumping there ex using- 1 sets of 10 reps, each exercise in order-  1-2 x daily, bilaterally Simple self MLD 1 x daily Daily skin care to increase hydration, skin mobility and decrease infection risk- can be done during MLD Compression wraps 23/7 during intensive phase of CDT Compression garments/ devices during self-management phase of CDT  ASSESSMENT:  CLINICAL IMPRESSION: Resumed short neck sequence since port tenderness and R lateral neck swelling is resolved this visit. Pt also continues to have digestive discomfort associated with chronic constipation alternating with diarrhea.  Pt tolerated the abdominal sequence as established without increased pain. Pt continues to  benefit from MLD for enhancing gut motility and bowel function.  MLD is also known to facilitate the removal of inflammatory substances and toxins from the intestinal area, which may help reduce inflammation linked to digestive disorders. Cont  1 x weekly as per POC.  11/15/22 Initial Evaluation: Clifford Coudriet is a 53 y.o. female presenting with very mild, stage II, LLE lymphedema 2/2 venous insufficiency and obesity. L Leg swelling and associated pain swelling fluctuates. It has progressed over time, and now no longer resolves with elevation over night. RLE swelling limits balance during functional ambulation. It exacerbates infection and falls risk. LLE/RLQ lymphedema interferes with functional performance in all occupational domains, including basic and instrumental ADLs, productive activities, leisure pursuits and social participation. Pt  will benefit from Occupational Therapy for Complete Decongestive Therapy (CDT) to restore function, to reduce physical and psychologic suffering associated with chronic, progressive lymphedema and associated pain, and to limit infection. CDT will include manual lymphatic drainage (MLD), skin care, therapeutic exercise and compression wraps and garment's devices. Without skilled OT for lymphedema care the condition will worsen and further functional decline is expected  Custom-made gradient compression garments and HOS devices are medically necessary because they are uniquely sized and shaped to fit the exact dimensions of the affected extremities, and to provide appropriate medical grade, graduated compression essential for optimally managing chronic, progressive lymphedema. Multiple custom compression garments are needed to ensure proper hygiene to limit infection risk. Custom compression garments should be replaced q 3-6 months When worn consistently for optimal lipo-lymphedema self-management over time. HOS devices, medically necessary to limit fibrosis buildup in tissue, should be replaced q 2 years and PRN when worn out.      OBJECTIVE IMPAIRMENTS: decreased activity tolerance, decreased knowledge of condition, decreased knowledge of  use of DME, increased edema, impaired sensation, pain, and chronic, progressive leg swelling.   ACTIVITY LIMITATIONS: dependent sitting, extended standing and/ or walking, squatting, and lower body dressing, fitting preferred street shoes  PARTICIPATION LIMITATIONS: shopping, community activity, occupation, and yard work  PERSONAL FACTORS: 3+ comorbidities: Hx DVT,  OSA, CVI. Varicose veins are also affecting patient's functional outcome.   REHAB POTENTIAL: Good  EVALUATION COMPLEXITY: Moderate   GOALS: Goals reviewed with patient? Yes  SHORT TERM GOALS: Target date: 4th OT Rx visit  SHORT TERM GOALS: Target date: 4th OT Rx visit   Pt will demonstrate  understanding of lymphedema precautions and prevention strategies with modified independence using a printed reference to identify at least 5 precautions and discussing how s/he may implement them into daily life to reduce risk of progression with extra time. Baseline:Max A Goal status: GOAL MET   Pt will be able to apply multilayer, knee length, gradient, compression wraps to one leg at a time with modified assistance (extra time and assistive device/s) to decrease limb volume, to limit infection risk, and to limit lymphedema progression.  Baseline: Dependent Goal status: GOAL DISCHARGED. Pt Going to compression garments instead  LONG TERM GOALS: Target date: 02/08/23  Given this patient's Intake score of 67%/100% on the functional outcomes FOTO tool, patient will experience an increase in function of 5 points to improve basic and instrumental ADLs performance, including lymphedema self-care.  Baseline: Max A Goal status:GOAL DISCHARGED.  FOTO TOOL DISCONTINUED AT CLINIC   Given this patient's Intake score of 50% on the Lymphedema Life Impact Scale (LLIS), patient will experience a reduction of at least 5 points in her perceived level of functional impairment resulting from lymphedema to improve functional performance and quality of life (QOL). Baseline: 50% Goal status: PROGRESSING  Pt will achieve at least a 10% volume reduction in B legs to return limb to typical size and shape, to limit infection risk and LE progression, to decrease pain, to improve function. Baseline: Dependent Goal status: PROGRESSING  4.  Pt will obtain proper compression garments/devices and achieve modified independence (extra time + assistive devices) with donning/doffing to optimize limb volume reductions and limit LE  progression over time. Baseline:  Goal status:GOAL MET  5.  During Intensive phase CDT , with modified independence, Pt will achieve at least 85% compliance with all lymphedema self-care home  program components, including daily skin care, compression wraps and /or garments, simple self MLD and lymphatic pumping therex to habituate LE self care protocol  into ADLs for optimal LE self-management over time. Baseline: Dependent Goal status:GOAL MET   PLAN:  OT FREQUENCY: 1 /week  OT DURATION: 12 weeks and PRN  PLANNED INTERVENTIONS: 97110-Therapeutic exercises, 97530- Therapeutic activity, 97535- Self Care, [id number]- Manual therapy, Patient/Family education, Manual lymph drainage, Compression bandaging, and fit with appropriate compression garments  PLAN FOR NEXT SESSION:  Cont MLD as established Pt and family edu for LE self-care  Zebedee Dec, MS, OTR/L, CLT-LANA 09/11/23 3:26 PM

## 2023-09-12 ENCOUNTER — Ambulatory Visit: Admitting: Physical Therapy

## 2023-09-12 ENCOUNTER — Encounter: Payer: Self-pay | Admitting: Physical Therapy

## 2023-09-12 DIAGNOSIS — S46812A Strain of other muscles, fascia and tendons at shoulder and upper arm level, left arm, initial encounter: Secondary | ICD-10-CM

## 2023-09-12 DIAGNOSIS — M542 Cervicalgia: Secondary | ICD-10-CM | POA: Diagnosis not present

## 2023-09-12 DIAGNOSIS — M6281 Muscle weakness (generalized): Secondary | ICD-10-CM

## 2023-09-12 DIAGNOSIS — M62838 Other muscle spasm: Secondary | ICD-10-CM

## 2023-09-12 NOTE — Therapy (Signed)
 OUTPATIENT PHYSICAL THERAPY CERVICAL TREATMENT   Patient Name: Karen [name] MRN: [id number] DOB:[birth year], 53 y.o., female Today's Date: 09/12/2023  END OF SESSION:  PT End of Session - 09/12/23 0939     Visit Number 7   ortho count 7   Date for PT Re-Evaluation 10/03/23    Authorization Type [name]    Authorization Time Period signed dry needling consent form 08/08/23    PT Start Time 0847    PT Stop Time 0931    PT Time Calculation (min) 44 min    Activity Tolerance Patient tolerated treatment well    Behavior During Therapy [facility] for tasks assessed/performed               Past Medical History:  Diagnosis Date   Adenomyosis    Anemia    Arthritis 2013   L knee gets steroid injections    Arthritis, rheumatic, acute or subacute    Asthmatic bronchitis 01/31/2017   Back pain    Chronic constipation    Diarrhea    DVT of lower extremity (deep venous thrombosis) (HCC)    Dysmenorrhea    Endometriosis    Esophageal stricture    G6PD deficiency    GERD (gastroesophageal reflux disease)    History of kidney stones    History of uterine fibroid    IBS (irritable bowel syndrome)    Interstitial cystitis    Lactose intolerance    Migraine    with aura   Other hemochromatosis 06/10/2021   PONV (postoperative nausea and vomiting)    Recurrent upper respiratory infection (URI)    Sebaceous cyst of breast, right lower inner quadrant 10/25/2012   Excised 11/21/12    Sleep apnea    Syncope, non cardiac    Past Surgical History:  Procedure Laterality Date   ARTHROSCOPIC HAGLUNDS REPAIR     BREAST CYST EXCISION Right 11/21/2012   Procedure: CYST EXCISION BREAST;  Surgeon: Sherlean JINNY Laughter, MD;  Location: [facility];  Service: General;  Laterality: Right;   BREAST CYST EXCISION Bilateral 01/18/2022   Procedure: EXCISION OF SEBACEOUS CYST BILATERAL BREAST;  Surgeon: Vanderbilt Ned, MD;  Location: [facility];  Service:  General;  Laterality: Bilateral;   BREAST EXCISIONAL BIOPSY Right 11/2012   CESAREAN SECTION  04/19/1993   CHOLECYSTECTOMY     COLONOSCOPY  05/02/2011   Procedure: COLONOSCOPY;  Surgeon: Lynwood LITTIE Celestia Mickey., MD;  Location: [facility];  Service: Endoscopy;  Laterality: N/A;   colonoscopy  11/04/2014   DENTAL SURGERY  03/06/2018   2 surgeries ( 03/06/2018 and 04/06/2018)   to remove two separate benign tumors   ESOPHAGEAL MANOMETRY N/A 11/04/2015   Procedure: ESOPHAGEAL MANOMETRY (EM);  Surgeon: Lynwood Celestia, MD;  Location: [facility];  Service: Endoscopy;  Laterality: N/A;   ESOPHAGOGASTRODUODENOSCOPY  05/02/2011   Procedure: ESOPHAGOGASTRODUODENOSCOPY (EGD);  Surgeon: Lynwood LITTIE Celestia Mickey., MD;  Location: [facility];  Service: Endoscopy;  Laterality: N/A;   ESOPHAGOGASTRODUODENOSCOPY N/A 09/01/2014   Procedure: ESOPHAGOGASTRODUODENOSCOPY (EGD);  Surgeon: Lynwood Celestia, MD;  Location: [facility];  Service: Endoscopy;  Laterality: N/A;   IR IMAGING GUIDED PORT INSERTION  07/06/2023   KNEE SURGERY     LAPAROSCOPY     x 4   LESION REMOVAL N/A 01/18/2022   Procedure: EXCISION OF SEBACEOUS CYSTS CHEST AND NECK;  Surgeon: Vanderbilt Ned, MD;  Location: [facility];  Service: General;  Laterality: N/A;   PH IMPEDANCE STUDY N/A 11/04/2015  Procedure: PH IMPEDANCE STUDY;  Surgeon: Lynwood Bohr, MD;  Location: [facility];  Service: Endoscopy;  Laterality: N/A;   VAGINAL HYSTERECTOMY  2010   TVH--ovaries remain   Patient Active Problem List   Diagnosis Date Noted   Sjogren syndrome with keratoconjunctivitis (HCC) 12/09/2022   Other hemochromatosis 06/10/2021   Elevated LFTs 04/04/2021   Colitis 04/04/2021   Bacterial overgrowth syndrome 05/21/2020   Obesity 05/21/2020   History of endometriosis 05/21/2020   Constipation due to outlet dysfunction 02/05/2020   Gastroesophageal reflux disease 02/05/2020   Obstructive sleep apnea treated with continuous positive airway  pressure (CPAP) 06/16/2017   Perennial allergic rhinitis with a nonallergic component 01/31/2017   Asthmatic bronchitis 01/31/2017   GI symptoms 01/31/2017   Family history of colon cancer 01/27/2015   Chest pain 12/13/2012   Dyspnea 12/13/2012   DVT of lower extremity (deep venous thrombosis) (HCC) 01/03/1990    PCP: Elliot Charm, MD   REFERRING PROVIDER: Huey Redell Fallow, PA-C  REFERRING DIAG: M54.2 (ICD-10-CM) - Cervicalgia  THERAPY DIAG:  Muscle weakness (generalized)  Other muscle spasm  Strain of left trapezius muscle, initial encounter  Cervicalgia  Rationale for Evaluation and Treatment: Rehabilitation  ONSET DATE: July 2025  SUBJECTIVE:                                                                                                                                                                                                         SUBJECTIVE STATEMENT: Patient presents with increased soreness in her neck today. She was sore after last treatment session. They are going to do a dye test for her port tomorrow.    Eval: Reports onset of L UT pain following port placement into R shoulder Hand dominance: Right  PERTINENT HISTORY:  07/21/2023 Assessment: - suspect left-sided cervical/ trapezius muscle spasm - reassured by absence of red flag signs/ symptoms at this time   Concurrent lymphedema PAIN: 08/29/23 Are you having pain? Yes: NPRS scale: 5/10 Pain location: L UT Pain description: ache, cramp Aggravating factors: activity Relieving factors: heat, medication  PRECAUTIONS: None  RED FLAGS: None     WEIGHT BEARING RESTRICTIONS: No  FALLS:  Has patient fallen in last 6 months? No  OCCUPATION: [employer] Employee  PLOF: Independent  PATIENT GOALS: To manage my neck pain  NEXT MD VISIT: TBD  OBJECTIVE:  Note: Objective measures were completed at Evaluation unless otherwise noted.  DIAGNOSTIC FINDINGS:  none  PATIENT  SURVEYS:  NDI: 28/50 56% perceived disability  Minimum Detectable Change (90% confidence): 5 points or 10% points  POSTURE: rounded shoulders and forward head  PALPATION: Global tenderness throughout B UT and posterior shoulder girdle   CERVICAL ROM:   Active ROM A/PROM (deg) eval  Flexion 75%P!  Extension 75%P!  Right lateral flexion 75%P!  Left lateral flexion 75%P!  Right rotation 75%P!  Left rotation 75%P!   (Blank rows = not tested)  UPPER EXTREMITY ROM:  Active ROM Right eval Left eval  Shoulder flexion 150 150  Shoulder extension    Shoulder abduction 150 150  Shoulder adduction    Shoulder extension    Shoulder internal rotation    Shoulder external rotation    Elbow flexion    Elbow extension    Wrist flexion    Wrist extension    Wrist ulnar deviation    Wrist radial deviation    Wrist pronation    Wrist supination     (Blank rows = not tested)  UPPER EXTREMITY MMT:  MMT Right eval Left eval  Shoulder flexion    Shoulder extension    Shoulder abduction    Shoulder adduction    Shoulder extension    Shoulder internal rotation    Shoulder external rotation    Middle trapezius    Lower trapezius    Elbow flexion    Elbow extension    Wrist flexion    Wrist extension    Wrist ulnar deviation    Wrist radial deviation    Wrist pronation    Wrist supination    Grip strength     (Blank rows = not tested)  CERVICAL SPECIAL TESTS:  Neck flexor muscle endurance test: Negative  FUNCTIONAL TESTS:  N/A  TREATMENT                     09/12/2023 Manual: seated soft tissue mobilization to bilateral upper trap, levator scap, bilateral cervical paraspinals  Upper trap stretch 2 x 20 sec bilateral  Scalene 2 x 20 sec bilateral  Cervical Melt method with foam roll (flexion/extension and rotation) x 20 each direction Chin tucks against foam roll x 12 Supine scap squeezes x 12 Supine serratus punch x 12 bilateral  Hooklying PPT 2 x 10 Bent knee  fall out + TA activation x 10 bilateral  Supine shoulder extension isometric 2 x 10 3 sec hold     09/07/2023 Manual: seated soft tissue mobilization to bilateral upper trap, levator scap, bilateral cervical paraspinals and bilateral subocciptals Shoulder circles x 10 each direction Chin tucks x 10 Seated scap squeeze x 10  Pool noodle between shoulder blades + shoulder flexion x 10 Cervical Melt method with foam roll (flexion/extension and rotation) x 20 each direction Cervical chin tuck isometric 2  x 10 3 sec hold Supine shoulder extension isometric 2 x 10 3 sec hold    08/29/2023 Discussion of current status and response to treatment  Manual therapy: supine soft tissue mobilization to left upper trap, levator scap, left cervical paraspinals and left subocciptals Education on benefits of meditation for muscle relaxation as well as deep breathing Awareness of shoulder hiking as a stress response Use of Theracane for trigger point release to left upper trap and levator Back to wall with lacrosse ball pinned to the wall:  cervical rotation right/left, rotation/lateral flexion, left UE elevation, left horizontal adduction 5x each  DATE: 08/23/2023 Review of chin tuck performance Seated upper trap stretch 2 x 20 seconds bil  Standing scap squeeze with small pool noodle x 12 Standing with small pool noodle between shoulder blades + shoulder ER with red TB x 12 Standing with small pool noodle between shoulder blades + shoulder abduction with red TB x 12 Standing shoulder row & extension  with red TB 2 x 10 Cervical Melt method with foam roll (flexion/extension and rotation) x 20 each direction Supine LTR x 8 each side 5 sec hold Hooklying TA activation 2 x 10 Hooklying TA + ball squeeze 2 x 10 TA activation + SLR x 8 bilateral  Manual: therapy rolling to left quads and adductors for  improved muscle elongation and decreased cramps Demonstrated standing and sitting quad and hamstring stretch for patient to perform after her walks     PATIENT EDUCATION:  Education details: Discussed eval findings, rehab rationale and POC and patient is in agreement  Person educated: Patient Education method: Explanation and Handouts Education comprehension: verbalized understanding and needs further education  HOME EXERCISE PROGRAM: Access Code: [email or web address] URL: [email or web address] Date: 09/12/2023 Prepared by: Kristeen Sar  Exercises - Seated Shoulder Shrugs  - 2 x daily - 5 x weekly - 2 sets - 10 reps - Seated Scapular Retraction  - 2 x daily - 5 x weekly - 2 sets - 10 reps - Gentle Levator Scapulae Stretch  - 1 x daily - 7 x weekly - 1 sets - 2 reps - 30 seconds hold - Seated Cervical Sidebending Stretch  - 1 x daily - 7 x weekly - 1 sets - 2 reps - 30 seconds hold - Quadricep Stretch with Chair and Counter Support  - 1 x daily - 7 x weekly - 2 sets - 20-30s hold - Standing Hamstring Stretch with Step  - 1 x daily - 7 x weekly - 2 sets - 20-30s hold - Seated Hamstring Stretch  - 1 x daily - 7 x weekly - 2 sets - 20-30 hold - Seated Hip Flexor Stretch  - 1 x daily - 7 x weekly - 2 sets - 20-30 hold - Seated Scalene Stretch with Towel  - 1 x daily - 7 x weekly - 2 sets - 20 reps ASSESSMENT:  CLINICAL IMPRESSION: Meira continues to verbalize increased soreness and tension in her cervical spine. She had increased soreness after isometrics last treatment session. Utilized IASTM tools for manual techniques today and patient verbalized a decrease in tension. Educated patient that redness is normal after techniques. Incorporated scalene stretch today and patient responded well especially on the right side. Patient will benefit from skilled PT to address the below impairments and improve overall function.     OBJECTIVE IMPAIRMENTS: decreased activity tolerance, decreased  mobility, decreased ROM, increased fascial restrictions, increased muscle spasms, impaired UE functional use, postural dysfunction, and pain.   ACTIVITY LIMITATIONS: carrying, lifting, bending, bed mobility, and reach over head  PERSONAL FACTORS: Age, Fitness, Past/current experiences, and 1 comorbidity: lymphedema are also affecting patient's functional outcome.   REHAB POTENTIAL: Good  CLINICAL DECISION MAKING: Stable/uncomplicated  EVALUATION COMPLEXITY: Low   GOALS: Goals reviewed with patient? No    SHORT TERM GOALS=LONG TERM GOALS: Target date: 10/03/23  Patient to demonstrate independence in HEP  Baseline: [email or web address] Goal status: INITIAL  2.  Patient will acknowledge 4/10 pain at least once during episode of care   Baseline: 8/10 Goal status: INITIAL  3.  Patient will score at least 20/50  on NDI to signify clinically meaningful improvement in functional abilities.   Baseline: 28/50 Goal status: INITIAL  4.  160d AROM in B shoulder flexion/abduction Baseline: 150d Goal status: INITIAL  5.  75% AROM cervical spine with minimal pain Baseline: 75% with moderate pain Goal status: INITIAL   PLAN:  PT FREQUENCY: 1-2x/week  PT DURATION: 6 weeks  PLANNED INTERVENTIONS: 97110-Therapeutic exercises, 97530- Therapeutic activity, 97112- Neuromuscular re-education, 97535- Self Care, and [id number]- Manual therapy  PLAN FOR NEXT SESSION:   postural strengthening; manual techniques as appropriate, aerobic tasks, ROM and flexibility activities  Kristeen Sar, PT 09/12/23 9:40 AM

## 2023-09-13 ENCOUNTER — Ambulatory Visit (HOSPITAL_COMMUNITY)
Admission: RE | Admit: 2023-09-13 | Discharge: 2023-09-13 | Disposition: A | Discharge status: Home / Self Care | Source: Ambulatory Visit | Attending: Family | Admitting: Family

## 2023-09-13 ENCOUNTER — Ambulatory Visit: Attending: Obstetrics & Gynecology

## 2023-09-13 ENCOUNTER — Other Ambulatory Visit: Payer: Self-pay | Admitting: Family

## 2023-09-13 DIAGNOSIS — M6281 Muscle weakness (generalized): Secondary | ICD-10-CM | POA: Diagnosis not present

## 2023-09-13 DIAGNOSIS — Z95828 Presence of other vascular implants and grafts: Secondary | ICD-10-CM

## 2023-09-13 DIAGNOSIS — M7989 Other specified soft tissue disorders: Secondary | ICD-10-CM | POA: Diagnosis not present

## 2023-09-13 DIAGNOSIS — R279 Unspecified lack of coordination: Secondary | ICD-10-CM | POA: Diagnosis not present

## 2023-09-13 DIAGNOSIS — T859XXA Unspecified complication of internal prosthetic device, implant and graft, initial encounter: Secondary | ICD-10-CM | POA: Insufficient documentation

## 2023-09-13 DIAGNOSIS — R102 Pelvic and perineal pain: Secondary | ICD-10-CM | POA: Diagnosis not present

## 2023-09-13 DIAGNOSIS — T85828A Fibrosis due to other internal prosthetic devices, implants and grafts, initial encounter: Secondary | ICD-10-CM | POA: Diagnosis not present

## 2023-09-13 DIAGNOSIS — M62838 Other muscle spasm: Secondary | ICD-10-CM | POA: Insufficient documentation

## 2023-09-13 DIAGNOSIS — M5459 Other low back pain: Secondary | ICD-10-CM | POA: Diagnosis not present

## 2023-09-13 HISTORY — PX: IR CV LINE INJECTION: IMG2294

## 2023-09-13 MED ORDER — IOHEXOL 300 MG/ML  SOLN: 50.0000 mL | Freq: Once | INTRAMUSCULAR | Status: DC | PRN

## 2023-09-13 NOTE — Therapy (Signed)
 OUTPATIENT PHYSICAL THERAPY FEMALE PELVIC TREATMENT   Patient Name: Karen [name] MRN: [id number] DOB:1970-10-31, 53 y.o., female Today's Date: 09/13/2023  END OF SESSION:  PT End of Session - 09/13/23 0942     Visit Number 8    Date for PT Re-Evaluation 10/03/23    Authorization Type [name]    Authorization Time Period signed dry needling consent form 08/08/23    PT Start Time 0940    PT Stop Time 1015    PT Time Calculation (min) 35 min    Activity Tolerance Patient tolerated treatment well    Behavior During Therapy [facility] for tasks assessed/performed                     Past Medical History:  Diagnosis Date   Adenomyosis    Anemia    Arthritis 2013   L knee gets steroid injections    Arthritis, rheumatic, acute or subacute    Asthmatic bronchitis 01/31/2017   Back pain    Chronic constipation    Diarrhea    DVT of lower extremity (deep venous thrombosis) (HCC)    Dysmenorrhea    Endometriosis    Esophageal stricture    G6PD deficiency    GERD (gastroesophageal reflux disease)    History of kidney stones    History of uterine fibroid    IBS (irritable bowel syndrome)    Interstitial cystitis    Lactose intolerance    Migraine    with aura   Other hemochromatosis 06/10/2021   PONV (postoperative nausea and vomiting)    Recurrent upper respiratory infection (URI)    Sebaceous cyst of breast, right lower inner quadrant 10/25/2012   Excised 11/21/12    Sleep apnea    Syncope, non cardiac    Past Surgical History:  Procedure Laterality Date   ARTHROSCOPIC HAGLUNDS REPAIR     BREAST CYST EXCISION Right 11/21/2012   Procedure: CYST EXCISION BREAST;  Surgeon: Sherlean JINNY Laughter, MD;  Location: [facility];  Service: General;  Laterality: Right;   BREAST CYST EXCISION Bilateral 01/18/2022   Procedure: EXCISION OF SEBACEOUS CYST BILATERAL BREAST;  Surgeon: Vanderbilt Ned, MD;  Location: [facility];  Service:  General;  Laterality: Bilateral;   BREAST EXCISIONAL BIOPSY Right 11/2012   CESAREAN SECTION  04/19/1993   CHOLECYSTECTOMY     COLONOSCOPY  05/02/2011   Procedure: COLONOSCOPY;  Surgeon: Lynwood LITTIE Celestia Mickey., MD;  Location: [facility];  Service: Endoscopy;  Laterality: N/A;   colonoscopy  11/04/2014   DENTAL SURGERY  03/06/2018   2 surgeries ( 03/06/2018 and 04/06/2018)   to remove two separate benign tumors   ESOPHAGEAL MANOMETRY N/A 11/04/2015   Procedure: ESOPHAGEAL MANOMETRY (EM);  Surgeon: Lynwood Celestia, MD;  Location: [facility];  Service: Endoscopy;  Laterality: N/A;   ESOPHAGOGASTRODUODENOSCOPY  05/02/2011   Procedure: ESOPHAGOGASTRODUODENOSCOPY (EGD);  Surgeon: Lynwood LITTIE Celestia Mickey., MD;  Location: [facility];  Service: Endoscopy;  Laterality: N/A;   ESOPHAGOGASTRODUODENOSCOPY N/A 09/01/2014   Procedure: ESOPHAGOGASTRODUODENOSCOPY (EGD);  Surgeon: Lynwood Celestia, MD;  Location: [facility];  Service: Endoscopy;  Laterality: N/A;   IR IMAGING GUIDED PORT INSERTION  07/06/2023   KNEE SURGERY     LAPAROSCOPY     x 4   LESION REMOVAL N/A 01/18/2022   Procedure: EXCISION OF SEBACEOUS CYSTS CHEST AND NECK;  Surgeon: Vanderbilt Ned, MD;  Location: [facility];  Service: General;  Laterality: N/A;   PH IMPEDANCE STUDY  N/A 11/04/2015   Procedure: PH IMPEDANCE STUDY;  Surgeon: Lynwood Bohr, MD;  Location: [facility];  Service: Endoscopy;  Laterality: N/A;   VAGINAL HYSTERECTOMY  2010   TVH--ovaries remain   Patient Active Problem List   Diagnosis Date Noted   Sjogren syndrome with keratoconjunctivitis (HCC) 12/09/2022   Other hemochromatosis 06/10/2021   Elevated LFTs 04/04/2021   Colitis 04/04/2021   Bacterial overgrowth syndrome 05/21/2020   Obesity 05/21/2020   History of endometriosis 05/21/2020   Constipation due to outlet dysfunction 02/05/2020   Gastroesophageal reflux disease 02/05/2020   Obstructive sleep apnea treated with continuous positive airway  pressure (CPAP) 06/16/2017   Perennial allergic rhinitis with a nonallergic component 01/31/2017   Asthmatic bronchitis 01/31/2017   GI symptoms 01/31/2017   Family history of colon cancer 01/27/2015   Chest pain 12/13/2012   Dyspnea 12/13/2012   DVT of lower extremity (deep venous thrombosis) (HCC) 01/03/1990    PCP: Elliot Charm, MD  REFERRING PROVIDER: Cleotilde Ronal RAMAN, MD   REFERRING DIAG: M62.89 (ICD-10-CM) - Pelvic floor dysfunction  THERAPY DIAG:  Muscle weakness (generalized)  Other muscle spasm  Other low back pain  Pelvic pain  Unspecified lack of coordination  Rationale for Evaluation and Treatment: Rehabilitation  ONSET DATE: chronic  SUBJECTIVE:                                                                                                                                                                                           SUBJECTIVE STATEMENT:  Pt states that she has continued consistently taking Miralax . She started working with nutritionist and is going to track bowel movements better to see if she can find any patterns. She is still walking on regular basis. She had some sharp abdominal pain yesterday after walking, but after several bowel movements and felt better.   PAIN: 09/12/23 Are you having pain? Yes NPRS scale: 4/10 Pain location: Lower abdominal pain, Lt sided pain, low back pain  Pain type: turning, knotted, pressure Pain description: intermittent and constant   Aggravating factors: constipation or frequent bowel movements/constantly having bowel movements  Relieving factors: exercises (bowel massage, happy baby, spinal twists, walking)  PRECAUTIONS: None  WEIGHT BEARING RESTRICTIONS: No  FALLS:  Has patient fallen in last 6 months? No  LIVING ENVIRONMENT: Lives with: lives with their spouse Lives in: House/apartment  OCCUPATION: nurse, in admin  PLOF: Independent  PATIENT GOALS: decrease pain and have more normal  bowel movements  PERTINENT HISTORY:  Vaginal hysterectomy, DVT LE, endometriosis, interstitial cystitis, exploratory laps for endometriosis, c-section, cholecystectomy, RA diagnosed 06/2023 Sexual abuse: No  BOWEL MOVEMENT: Pain with bowel movement:  Yes Type of bowel movement:Type (Bristol Stool Scale) 1-7 (sometimes full range in the same bowel movement), Frequency sometimes many times a day, sometimes up to 4 days in between, and Strain Yes Fully empty rectum: No Leakage: No Pads: No Fiber supplement: No - has attempted metamucil and it made constipation worse  URINATION: Pain with urination: Yes Fully empty bladder: No Stream: varies  Urgency: Yes: all the time Frequency: multiple times an hours  Leakage: Urge to void, Walking to the bathroom, Coughing, Sneezing, Laughing, and Exercise Pads: Yes: daily  INTERCOURSE: Pain with intercourse: Initial Penetration and During Penetration Ability to have vaginal penetration:  Yes: with pain Climax: non painful WNL   PREGNANCY: Vaginal deliveries 1 Tearing Yes: 3rd degree tear C-section deliveries 1 Currently pregnant No  PROLAPSE: Periodically will feel vaginal bulging, heaviness in lower abdomen   OBJECTIVE:  05/04/23:               Internal Pelvic Floor: mild tenderness in bil levator ani, but no increase in tension; with internal rectal exam, she was demonstrating paradoxical contraction when trying to eccentrically lengthen with exhale and had very hard time correcting  Patient confirms identification and approves PT to assess internal pelvic floor and treatment Yes  PELVIC MMT:   MMT eval  Vaginal 2/5, 3 second hold, 3 repeat contractions  Internal Anal Sphincter 1/5  External Anal Sphincter 2/5  Puborectalis 1/5  Diastasis Recti   (Blank rows = not tested)        TONE: WNL   PROLAPSE: Grade 2 anterior vaginal wall laxity  04/25/23: Curl-up test: abdominal distortion Transversus abdominus: week, difficulty  getting active contraction (not been working on strengthening)    LUMBARAROM/PROM:  A/PROM A/PROM  Eval (% available) 11/09/22 (% available) 04/25/23 (% available)  Flexion 50 90 90  Extension 25, pain anteriorly  25, pain in low back 50  Right lateral flexion 50, pain anteriorly  75, pain on right 50, pain on Rt  Left lateral flexion 50, pain anteriorly  75, pain on right 75   (Blank rows = not tested)  11/09/22: No internal rectal or vaginal pelvic floor exam performed due to high level of skin irritation   Weak transversus abdominus contraction  Abdominal restriction and tenderness in bil lower quadrant  Unable to perform pelvic tilt with appropriate coordination  LUMBARAROM/PROM:  A/PROM A/PROM  Eval (% available) 11/09/22 (% available)  Flexion 50 90  Extension 25, pain anteriorly  25, pain in low back  Right lateral flexion 50, pain anteriorly  75, pain on right  Left lateral flexion 50, pain anteriorly  75, pain on right   (Blank rows = not tested) 07/21/22: standing prolapse assessment demonstrated grade 2 anterior vaginal wall laxity   06/08/22:               External Perineal Exam: WNL                              Internal Pelvic Floor: burning reported with palpation of superficial muscles; deep aching/pressure with palpation of Lt levator ani   Patient confirms identification and approves PT to assess internal pelvic floor and treatment Yes  PELVIC MMT:   MMT eval  Vaginal 3/5  Internal Anal Sphincter 1/5  External Anal Sphincter 2/5  Puborectalis 1/5  Diastasis Recti   (Blank rows = not tested)        TONE: High in Lt levator  ani   PROLAPSE: Not able to tell this session due to dyssynergic pelvic floor contraction     06/01/22: COGNITION: Overall cognitive status: Within functional limits for tasks assessed     SENSATION: Light touch: Appears intact Proprioception: Appears intact  GAIT: Comments: decreased hip extension, forward flexed  trunk  POSTURE: rounded shoulders, forward head, decreased lumbar lordosis, increased thoracic kyphosis, posterior pelvic tilt, and flexed trunk    LUMBARAROM/PROM:  A/PROM A/PROM  Eval (% available)  Flexion 50  Extension 25, pain anteriorly   Right lateral flexion 50, pain anteriorly   Left lateral flexion 50, pain anteriorly    (Blank rows = not tested)   PALPATION:   General  Significant abdominal restriction and tenderness; decreased rib mobility with mobilization and breathing    TODAY'S TREATMENT:                                                                                                                              DATE:  09/13/23 Manual: ILU bowel mobilization  Abdominal scar tissue mobilization Ileocecal valve mobilization External rectum mobilization Myofascial release to abdomen with push/pull alternating forces horizontally  Diaphragm release All techniques performed in supine  Exercises: Lateral lunge stretch 5 x 10 seconds bil  Ball up wall 10x  Therapeutic activities: Heel raises 2 x 10 3 way kick kick, 10x each, bil   09/06/23 Manual: ILU bowel mobilization  Abdominal scar tissue mobilization Ileocecal valve mobilization External rectum mobilization Myofascial release to abdomen with push/pull alternating forces horizontally  Diaphragm release All techniques performed in supine  Neuromuscular re-education: Supine horizontal abduction/extension + red band 2 x 10 Supine unilateral bent knee fall out + green band 10x bil Exercises: Sidelying clam shells 2 x 10 bil Sidelying reverse clam shells 2 x 10 bil Supine bridge + hip adduction 2 x 10   08/30/23 Manual: ILU bowel mobilization  Abdominal scar tissue mobilization Ileocecal valve mobilization External rectum mobilization Myofascial release to abdomen with push/pull alternating forces horizontally  Diaphragm release All techniques performed in supine  Therapeutic activities: Tracking  fiber to make sure she is getting 8-12g with each meal    PATIENT EDUCATION:  Education details: See above Person educated: Patient Education method: Explanation, Demonstration, Tactile cues, Verbal cues, and Handouts Education comprehension: verbalized understanding  HOME EXERCISE PROGRAM: [id number]  ASSESSMENT:  CLINICAL IMPRESSION: Pt doing better this week with having a little more productive bowel movements. She is being diligent with walking and other exercises at home. Due to this, we progressed to slightly more functional strengthening training and mobility in session today with good tolerance. She tolerated all manual techniques well and they were used to continue assisting with good motility and productiveness of bowel movements. She will continue to benefit from skilled PT intervention in order to decrease pain, improve bowel movements, and decrease urinary urgency/incontinence.     OBJECTIVE IMPAIRMENTS: decreased activity tolerance, decreased coordination, decreased endurance, decreased mobility, decreased strength,  increased fascial restrictions, increased muscle spasms, impaired tone, postural dysfunction, and pain.   ACTIVITY LIMITATIONS: lifting, bending, continence, and locomotion level  PARTICIPATION LIMITATIONS: interpersonal relationship, community activity, and occupation  PERSONAL FACTORS: 1 comorbidity: medical history are also affecting patient's functional outcome.   REHAB POTENTIAL: Good  CLINICAL DECISION MAKING: Stable/uncomplicated  EVALUATION COMPLEXITY: Low   GOALS: Goals reviewed with patient? Yes  SHORT TERM GOALS: Updated 08/01/23  Pt will be independent with HEP.   Baseline: Goal status: MET 07/12/22  2.  Pt will be independent with diaphragmatic breathing and down training activities in order to improve pelvic floor relaxation.  Baseline:  Goal status: MET 07/12/22  3.  Pt will be independent with the knack, urge suppression technique,  and double voiding in order to improve bladder habits and decrease urinary incontinence.   Baseline:  Goal status: MET 09/22/22  4.  Pt will be independent with use of squatty potty, relaxed toileting mechanics, and improved bowel movement techniques in order to increase ease of bowel movements and complete evacuation.   Baseline:  Goal status: MET 11/09/22  5.  Pt will be able to correctly perform diaphragmatic breathing and appropriate pressure management in order to prevent worsening vaginal wall laxity and improve pelvic floor A/ROM.   Baseline:  Goal status: MET 01/18/23  LONG TERM GOALS: Updated 08/22/23  Pt will be independent with advanced HEP.   Baseline:  Goal status: IN PROGRESS 08/22/23  2.  Pt will demonstrate normal pelvic floor muscle tone and A/ROM, able to achieve 4/5 strength with contractions and 10 sec endurance, in order to provide appropriate lumbopelvic support in functional activities.   Baseline: not assessed today per patient request  Goal status: IN PROGRESS 08/22/23  3.  Pt will increase all impaired lumbar A/ROM by 25% without pain.  Baseline: Pt seen a little bit of loss in some and improvement in extension Goal status:  IN PROGRESS 08/22/23  4.  Pt will report pain no higher than 3/10 with any activity. Baseline: pain has gotten up to 5/10, but currently at a 3/10 Goal status:  IN PROGRESS 08/22/23  5.  Pt will report 0/10 pain with vaginal penetration in order to improve intimate relationship with partner.    Baseline: no recent intercourse attempts  Goal status:  IN PROGRESS 08/22/23  6.  Pt will be able to go 2-3 hours in between voids without urgency or incontinence in order to improve QOL and perform all functional activities with less difficulty.   Baseline:  Goal status: MET 02/21/23  7.  Pt will report no leaks with laughing, coughing, sneezing in order to improve comfort with interpersonal relationships and community activities.    Baseline: Pt states that she feels 30-40% better Goal status:  IN PROGRESS 08/22/23  8.  Pt will have consistent bowel movements 4-5x/week without straining or pain.  Baseline:  Goal status:  MET 02/21/23  PLAN:  PT FREQUENCY: 1-2x/week  PT DURATION: 6 months  PLANNED INTERVENTIONS: Therapeutic exercises, Therapeutic activity, Neuromuscular re-education, Balance training, Gait training, Patient/Family education, Self Care, Joint mobilization, Dry Needling, Biofeedback, and Manual therapy  PLAN FOR NEXT SESSION: strengthening program - reinforce importance and go over some exercises each session; internal pelvic floor muscle exam  Josette Mares, PT, DPT09/10/259:53 AM

## 2023-09-13 NOTE — Procedures (Signed)
 Interventional Radiology Procedure Note  Procedure: port injection    Complications: None  Estimated Blood Loss:  0  Findings: Tiny bit of fibrin sheath at the tip Full report in pacs Rec tPA dwell at next lab draw or phlebotomy session     M. FREDERIC SPECKING, MD

## 2023-09-14 ENCOUNTER — Telehealth (HOSPITAL_BASED_OUTPATIENT_CLINIC_OR_DEPARTMENT_OTHER): Payer: Self-pay | Admitting: Obstetrics & Gynecology

## 2023-09-14 DIAGNOSIS — M25531 Pain in right wrist: Secondary | ICD-10-CM | POA: Diagnosis not present

## 2023-09-14 NOTE — Telephone Encounter (Addendum)
 New message   The patient calling regarding question the allergies notated on your chart   Recommendation from  Dr. Cleotilde know of any OB/ GYN would be able to see her daughter.

## 2023-09-15 ENCOUNTER — Encounter: Admitting: Occupational Therapy

## 2023-09-18 ENCOUNTER — Encounter: Payer: Self-pay | Admitting: Occupational Therapy

## 2023-09-18 ENCOUNTER — Ambulatory Visit: Admitting: Occupational Therapy

## 2023-09-18 DIAGNOSIS — I890 Lymphedema, not elsewhere classified: Secondary | ICD-10-CM | POA: Diagnosis not present

## 2023-09-18 NOTE — Therapy (Signed)
 OUTPATIENT OCCUPATIONAL THERAPY TREATMENT NOTE  BLE/ BLQ LYMPHEDEMA  Patient Name: Karen [name] MRN: [id number] DOB:[birth year], 53 y.o., female Today's Date: 09/18/2023  REPORTING PERIOD:  END OF SESSION:  OT End of Session - 09/18/23 1611     Visit Number 54    Number of Visits 72    Date for OT Re-Evaluation 11/16/23    OT Start Time 0800    OT Stop Time 0905    OT Time Calculation (min) 65 min    Activity Tolerance Patient tolerated treatment well;No increased pain    Behavior During Therapy [facility] for tasks assessed/performed                Past Medical History:  Diagnosis Date   Adenomyosis    Anemia    Arthritis 2013   L knee gets steroid injections    Arthritis, rheumatic, acute or subacute    Asthmatic bronchitis 01/31/2017   Back pain    Chronic constipation    Diarrhea    DVT of lower extremity (deep venous thrombosis) (HCC)    Dysmenorrhea    Endometriosis    Esophageal stricture    G6PD deficiency    GERD (gastroesophageal reflux disease)    History of kidney stones    History of uterine fibroid    IBS (irritable bowel syndrome)    Interstitial cystitis    Lactose intolerance    Migraine    with aura   Other hemochromatosis 06/10/2021   PONV (postoperative nausea and vomiting)    Recurrent upper respiratory infection (URI)    Sebaceous cyst of breast, right lower inner quadrant 10/25/2012   Excised 11/21/12    Sleep apnea    Syncope, non cardiac    Past Surgical History:  Procedure Laterality Date   ARTHROSCOPIC HAGLUNDS REPAIR     BREAST CYST EXCISION Right 11/21/2012   Procedure: CYST EXCISION BREAST;  Surgeon: Sherlean JINNY Laughter, MD;  Location: [facility];  Service: General;  Laterality: Right;   BREAST CYST EXCISION Bilateral 01/18/2022   Procedure: EXCISION OF SEBACEOUS CYST BILATERAL BREAST;  Surgeon: Vanderbilt Ned, MD;  Location: [facility];  Service: General;  Laterality: Bilateral;   BREAST  EXCISIONAL BIOPSY Right 11/2012   CESAREAN SECTION  04/19/1993   CHOLECYSTECTOMY     COLONOSCOPY  05/02/2011   Procedure: COLONOSCOPY;  Surgeon: Lynwood LITTIE Celestia Mickey., MD;  Location: [facility];  Service: Endoscopy;  Laterality: N/A;   colonoscopy  11/04/2014   DENTAL SURGERY  03/06/2018   2 surgeries ( 03/06/2018 and 04/06/2018)   to remove two separate benign tumors   ESOPHAGEAL MANOMETRY N/A 11/04/2015   Procedure: ESOPHAGEAL MANOMETRY (EM);  Surgeon: Lynwood Celestia, MD;  Location: [facility];  Service: Endoscopy;  Laterality: N/A;   ESOPHAGOGASTRODUODENOSCOPY  05/02/2011   Procedure: ESOPHAGOGASTRODUODENOSCOPY (EGD);  Surgeon: Lynwood LITTIE Celestia Mickey., MD;  Location: [facility];  Service: Endoscopy;  Laterality: N/A;   ESOPHAGOGASTRODUODENOSCOPY N/A 09/01/2014   Procedure: ESOPHAGOGASTRODUODENOSCOPY (EGD);  Surgeon: Lynwood Celestia, MD;  Location: [facility];  Service: Endoscopy;  Laterality: N/A;   IR CV LINE INJECTION  09/13/2023   IR IMAGING GUIDED PORT INSERTION  07/06/2023   KNEE SURGERY     LAPAROSCOPY     x 4   LESION REMOVAL N/A 01/18/2022   Procedure: EXCISION OF SEBACEOUS CYSTS CHEST AND NECK;  Surgeon: Vanderbilt Ned, MD;  Location: [facility];  Service: General;  Laterality: N/A;   PH IMPEDANCE STUDY N/A 11/04/2015  Procedure: PH IMPEDANCE STUDY;  Surgeon: Lynwood Bohr, MD;  Location: [facility];  Service: Endoscopy;  Laterality: N/A;   VAGINAL HYSTERECTOMY  2010   TVH--ovaries remain   Patient Active Problem List   Diagnosis Date Noted   Sjogren syndrome with keratoconjunctivitis (HCC) 12/09/2022   Other hemochromatosis 06/10/2021   Elevated LFTs 04/04/2021   Colitis 04/04/2021   Bacterial overgrowth syndrome 05/21/2020   Obesity 05/21/2020   History of endometriosis 05/21/2020   Constipation due to outlet dysfunction 02/05/2020   Gastroesophageal reflux disease 02/05/2020   Obstructive sleep apnea treated with continuous positive airway pressure (CPAP)  06/16/2017   Perennial allergic rhinitis with a nonallergic component 01/31/2017   Asthmatic bronchitis 01/31/2017   GI symptoms 01/31/2017   Family history of colon cancer 01/27/2015   Chest pain 12/13/2012   Dyspnea 12/13/2012   DVT of lower extremity (deep venous thrombosis) (HCC) 01/03/1990    PCP: Gaile New, MD  REFERRING PROVIDER: Valery Ripple, MD  REFERRING DIAG: I89.0  THERAPY DIAG:  Lymphedema, not elsewhere classified  Rationale for Evaluation and Treatment: Rehabilitation  ONSET DATE: ~2014; exacerbation in 2023  SUBJECTIVE:                                                                                                                                                                                          SUBJECTIVE STATEMENT:  Pt presents for OT to address lower quadrant, including deep abdominal lymphatics, and LLE lymphedema w/ associated pain. Pt has no new complaints.  Pt rate pain/ discomfort in the abdomen at 2/10. She reports ongoing constipation alternating with diarrhea. After our session last week my bowels were more productive. I had a tough day yesterday. They are doing a CT scan of my abdomen and pelvis tomorrow.  PERTINENT HISTORY: CVI, OSA (no CPAP), GERD, Esophageal stricture, IBS, Lactose intolerant, Diarrhea, Hx LE DVT, recent RA dx  PAIN:  Are you having pain? Yes: 2/10 Pain location: abdomen; digestive discomfort Pain description: abdominal fullness, tightness Aggravating factors: standing, walking,  Relieving factors: MLD, Miralax ,   PRECAUTIONS: Other: LYMPHEDEMA; Hx LLE DVT  WEIGHT BEARING RESTRICTIONS: No  FALLS:  Has patient fallen in last 6 months? No  LIVING ENVIRONMENT: Lives with: lives with spouse and daughter Lives in: House/apartment Stairs: Yes; Internal: 14 steps; yes and External: 4 steps; yes Has following equipment at home: None  OCCUPATION: [employer] operational officer, walking, desk work  LEISURE: family  time  HAND DOMINANCE: right   PRIOR LEVEL OF FUNCTION: Independent  PATIENT GOALS: Feel better, be able to be more active without pain, wear preferred street shoes. NEW 08/03/23: Be able to manage  digestive discomfort and gut inflammation symptoms without medications  OBJECTIVE: Note: Objective measures were completed at Evaluation unless otherwise noted.  COGNITION:  Overall cognitive status: Within functional limits for tasks assessed   POSTURE: WFL  LE ROM: WFL  LE MMT: WFL  LYMPHEDEMA ASSESSMENTS: non-Ca related  INFECTIONS: denies cellulitis and wound hx  BLE COMPARATIVE LIMB VOLUMETRICS Initial 11/15/22  LANDMARK RIGHT (dominant)  R LEG (A-D) 3028.9 ml  R THIGH (E-G) 4809.60 ml  R FULL LIMB (A-G) 7838.5 ml  Limb Volume differential (LVD)  %  Volume change since initial %  Volume change overall V  (Blank rows = not tested)  LANDMARK LEFT  (Rx)  L LEG (A-D) 3189.9 ml  L THIGH (E-G) 4959.8 ml  L FULL LIMB (A-G) 8149.7 ml  Limb Volume differential (LVD)  LEG LVD = 5.32%, L>R; THIGH LVD = 3.12%, L>R; And full LLE LVD = 3.97%, L>R.   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  Skin  Description Hyper-Keratosis Peau d' Orange Shiny Tight Fibrotic/ Indurated Fatty Doughy Spongy/ boggy       R>L x  x   Skin dry Flaky WNL Macerated   mildly      Color Redness Varicosities Blanching Hemosiderin Stain Mottled        x   Odor Malodorous Yeast Fungal infection  WNL      x   Temperature Warm Cool wnl     x    Pitting Edema   1+ 2+ 3+ 4+ Non-pitting         x    Girth Symmetrical Asymmetrical                   Distribution    L>R toes to groin    Stemmer Sign Positive Negative   +L -R   Lymphorrhea History Of:  Present Absent     x    Wounds History Of Present Absent Venous Arterial Pressure Sheer     x        Signs of Infection Redness Warmth Erythema Acute Swelling Drainage Borders                    Sensation Light Touch Deep pressure Hypersensitivity   In tact Impaired In tact Impaired Absent Impaired   x  x  x     Nails WNL   Fungus nail dystrophy   x     Hair Growth Symmetrical Asymmetrical   x    Skin Creases Base of toes  Ankles   Base of Fingers knees       Abdominal pannus Thigh Lobules  Face/neck   x           GAIT: Distance walked: >500' Assistive device utilized: None Level of assistance: Complete Independence Comments: Functional ambulation and transfers [facility]  LYMPHEDEMA LIFE IMPACT SCALE (LLIS): Initial 11/15/22  50%  FOTO functional outcome measure: Deferred. FOTO discontinued.  TODAY'S TREATMENT:  MLD to abdominal lymphatics Pt edu for LE self care  PATIENT EDUCATION:  Continued Pt/ CG edu for how function of cisterna chyli, part of the thoracic duct of deep abdominal lymphatics, assists with digestion by filtering many of the fats and proteins from the digestive system. Sub optimal lymphatic flow in this region may result in constipation, bloating, swelling, etc. MLD helps mobilize these protein and fats , and the rhythmic movement of manual lymphatic drainage stimulates digestive muscles and increase peristalsis. Provided printed resource for reference.  Topics include outcome of comparative limb volumetrics- starting limb volume differentials (LVDs), technology and gradient techniques used for short stretch, multilayer compression wrapping, simple self-MLD,  therapeutic lymphatic pumping exercises, skin/nail care, LE precautions,. compression garment recommendations and specifications, wear and care schedule and compression garment donning / doffing w assistive devices. Discussed progress towards all OT goals since commencing CDT. All questions answered to the Pt's satisfaction. Good return. Person educated: Patient  Education method: Explanation, Demonstration, and Handouts Education comprehension: verbalized understanding, returned demonstration, verbal cues required, and needs further education  HOME EXERCISE PROGRAM: BLE lymphatic pumping there ex using- 1 sets of 10 reps, each exercise in order-  1-2 x daily, bilaterally Simple self MLD 1 x daily Daily skin care to increase hydration, skin mobility and decrease infection risk- can be done during MLD Compression wraps 23/7 during intensive phase of CDT Compression garments/ devices during self-management phase of CDT  ASSESSMENT:  CLINICAL IMPRESSION: Resumed short neck sequence since port tenderness and R lateral neck swelling is resolved this visit. Pt also continues to have digestive discomfort associated with chronic constipation alternating with diarrhea.  Pt tolerated the abdominal sequence as established without increased pain. Pt continues to  benefit from MLD for enhancing gut motility and bowel function.  MLD is also known to facilitate the removal of inflammatory substances and toxins from the intestinal area, which may help reduce inflammation linked to digestive disorders. Cont  1 x weekly as per POC.  11/15/22 Initial Evaluation: Pieper Kasik is a 53 y.o. female presenting with very mild, stage II, LLE lymphedema 2/2 venous insufficiency and obesity. L Leg swelling and associated pain swelling fluctuates. It has progressed over time, and now no longer resolves with elevation over night. RLE swelling limits balance during functional ambulation. It exacerbates infection and falls  risk. LLE/RLQ lymphedema interferes with functional performance in all occupational domains, including basic and instrumental ADLs, productive activities, leisure pursuits and social participation. Pt will benefit from Occupational Therapy for Complete Decongestive Therapy (CDT) to restore function, to reduce physical and psychologic suffering associated with chronic, progressive lymphedema and associated pain, and to limit infection. CDT will include manual lymphatic drainage (MLD), skin care, therapeutic exercise and compression wraps and garment's devices. Without skilled OT for lymphedema care the condition will worsen and further functional decline is expected  Custom-made gradient compression garments and HOS devices are medically necessary because they are uniquely sized and shaped to fit the exact dimensions of the affected extremities, and to provide appropriate medical grade, graduated compression essential for optimally managing chronic, progressive lymphedema. Multiple custom compression garments are needed to ensure proper hygiene to limit infection risk. Custom compression garments should be replaced q 3-6 months When worn consistently for optimal lipo-lymphedema self-management over time. HOS devices, medically necessary to limit fibrosis buildup in tissue, should be replaced q 2 years and PRN when worn out.      OBJECTIVE IMPAIRMENTS: decreased activity tolerance, decreased knowledge of condition, decreased knowledge of  use of DME, increased edema, impaired sensation, pain, and chronic, progressive leg swelling.   ACTIVITY LIMITATIONS: dependent sitting, extended standing and/ or walking, squatting, and lower body dressing, fitting preferred street shoes  PARTICIPATION LIMITATIONS: shopping, community activity, occupation, and yard work  PERSONAL FACTORS: 3+ comorbidities: Hx DVT,  OSA, CVI. Varicose veins are also affecting patient's functional outcome.   REHAB POTENTIAL:  Good  EVALUATION COMPLEXITY: Moderate   GOALS: Goals reviewed with patient? Yes  SHORT TERM GOALS: Target date: 4th OT Rx visit  SHORT TERM GOALS: Target date: 4th OT Rx visit   Pt will demonstrate understanding of lymphedema precautions and prevention strategies with modified independence using a printed reference to identify at least 5 precautions and discussing how s/he may implement them into daily life to reduce risk of progression with extra time. Baseline:Max A Goal status: GOAL MET   Pt will be able to apply multilayer, knee length, gradient, compression wraps to one leg at a time with modified assistance (extra time and assistive device/s) to decrease limb volume, to limit infection risk, and to limit lymphedema progression.  Baseline: Dependent Goal status: GOAL DISCHARGED. Pt Going to compression garments instead  LONG TERM GOALS: Target date: 02/08/23  Given this patient's Intake score of 67%/100% on the functional outcomes FOTO tool, patient will experience an increase in function of 5 points to improve basic and instrumental ADLs performance, including lymphedema self-care.  Baseline: Max A Goal status:GOAL DISCHARGED.  FOTO TOOL DISCONTINUED AT CLINIC   Given this patient's Intake score of 50% on the Lymphedema Life Impact Scale (LLIS), patient will experience a reduction of at least 5 points in her perceived level of functional impairment resulting from lymphedema to improve functional performance and quality of life (QOL). Baseline: 50% Goal status: PROGRESSING  Pt will achieve at least a 10% volume reduction in B legs to return limb to typical size and shape, to limit infection risk and LE progression, to decrease pain, to improve function. Baseline: Dependent Goal status: PROGRESSING  4.  Pt will obtain proper compression garments/devices and achieve modified independence (extra time + assistive devices) with donning/doffing to optimize limb volume reductions and  limit LE  progression over time. Baseline:  Goal status:GOAL MET  5.  During Intensive phase CDT , with modified independence, Pt will achieve at least 85% compliance with all lymphedema self-care home program components, including daily skin care, compression wraps and /or garments, simple self MLD and lymphatic pumping therex to habituate LE self care protocol  into ADLs for optimal LE self-management over time. Baseline: Dependent Goal status:GOAL MET   PLAN:  OT FREQUENCY: 1 /week  OT DURATION: 12 weeks and PRN  PLANNED INTERVENTIONS: 97110-Therapeutic exercises, 97530- Therapeutic activity, 97535- Self Care, [id number]- Manual therapy, Patient/Family education, Manual lymph drainage, Compression bandaging, and fit with appropriate compression garments  PLAN FOR NEXT SESSION:  Cont MLD as established Pt and family edu for LE self-care  Zebedee Dec, MS, OTR/L, CLT-LANA 09/18/23 4:11 PM

## 2023-09-18 NOTE — Therapy (Deleted)
 OUTPATIENT PHYSICAL THERAPY FEMALE PELVIC TREATMENT   Patient Name: Karen [name] MRN: [id number] DOB:Jul 20, 1970, 53 y.o., female Today's Date: 09/18/2023  END OF SESSION:               Past Medical History:  Diagnosis Date   Adenomyosis    Anemia    Arthritis 2013   L knee gets steroid injections    Arthritis, rheumatic, acute or subacute    Asthmatic bronchitis 01/31/2017   Back pain    Chronic constipation    Diarrhea    DVT of lower extremity (deep venous thrombosis) (HCC)    Dysmenorrhea    Endometriosis    Esophageal stricture    G6PD deficiency    GERD (gastroesophageal reflux disease)    History of kidney stones    History of uterine fibroid    IBS (irritable bowel syndrome)    Interstitial cystitis    Lactose intolerance    Migraine    with aura   Other hemochromatosis 06/10/2021   PONV (postoperative nausea and vomiting)    Recurrent upper respiratory infection (URI)    Sebaceous cyst of breast, right lower inner quadrant 10/25/2012   Excised 11/21/12    Sleep apnea    Syncope, non cardiac    Past Surgical History:  Procedure Laterality Date   ARTHROSCOPIC HAGLUNDS REPAIR     BREAST CYST EXCISION Right 11/21/2012   Procedure: CYST EXCISION BREAST;  Surgeon: Sherlean JINNY Laughter, MD;  Location: [facility];  Service: General;  Laterality: Right;   BREAST CYST EXCISION Bilateral 01/18/2022   Procedure: EXCISION OF SEBACEOUS CYST BILATERAL BREAST;  Surgeon: Vanderbilt Ned, MD;  Location: [facility];  Service: General;  Laterality: Bilateral;   BREAST EXCISIONAL BIOPSY Right 11/2012   CESAREAN SECTION  04/19/1993   CHOLECYSTECTOMY     COLONOSCOPY  05/02/2011   Procedure: COLONOSCOPY;  Surgeon: Lynwood LITTIE Celestia Mickey., MD;  Location: [facility];  Service: Endoscopy;  Laterality: N/A;   colonoscopy  11/04/2014   DENTAL SURGERY  03/06/2018   2 surgeries ( 03/06/2018 and 04/06/2018)   to remove two separate benign tumors    ESOPHAGEAL MANOMETRY N/A 11/04/2015   Procedure: ESOPHAGEAL MANOMETRY (EM);  Surgeon: Lynwood Celestia, MD;  Location: [facility];  Service: Endoscopy;  Laterality: N/A;   ESOPHAGOGASTRODUODENOSCOPY  05/02/2011   Procedure: ESOPHAGOGASTRODUODENOSCOPY (EGD);  Surgeon: Lynwood LITTIE Celestia Mickey., MD;  Location: [facility];  Service: Endoscopy;  Laterality: N/A;   ESOPHAGOGASTRODUODENOSCOPY N/A 09/01/2014   Procedure: ESOPHAGOGASTRODUODENOSCOPY (EGD);  Surgeon: Lynwood Celestia, MD;  Location: [facility];  Service: Endoscopy;  Laterality: N/A;   IR CV LINE INJECTION  09/13/2023   IR IMAGING GUIDED PORT INSERTION  07/06/2023   KNEE SURGERY     LAPAROSCOPY     x 4   LESION REMOVAL N/A 01/18/2022   Procedure: EXCISION OF SEBACEOUS CYSTS CHEST AND NECK;  Surgeon: Vanderbilt Ned, MD;  Location: [facility];  Service: General;  Laterality: N/A;   PH IMPEDANCE STUDY N/A 11/04/2015   Procedure: PH IMPEDANCE STUDY;  Surgeon: Lynwood Celestia, MD;  Location: [facility];  Service: Endoscopy;  Laterality: N/A;   VAGINAL HYSTERECTOMY  2010   TVH--ovaries remain   Patient Active Problem List   Diagnosis Date Noted   Sjogren syndrome with keratoconjunctivitis (HCC) 12/09/2022   Other hemochromatosis 06/10/2021   Elevated LFTs 04/04/2021   Colitis 04/04/2021   Bacterial overgrowth syndrome 05/21/2020   Obesity 05/21/2020   History of endometriosis 05/21/2020  Constipation due to outlet dysfunction 02/05/2020   Gastroesophageal reflux disease 02/05/2020   Obstructive sleep apnea treated with continuous positive airway pressure (CPAP) 06/16/2017   Perennial allergic rhinitis with a nonallergic component 01/31/2017   Asthmatic bronchitis 01/31/2017   GI symptoms 01/31/2017   Family history of colon cancer 01/27/2015   Chest pain 12/13/2012   Dyspnea 12/13/2012   DVT of lower extremity (deep venous thrombosis) (HCC) 01/03/1990    PCP: Elliot Charm, MD  REFERRING PROVIDER: Cleotilde Ronal RAMAN, MD   REFERRING DIAG: M62.89 (ICD-10-CM) - Pelvic floor dysfunction  THERAPY DIAG:  Lymphedema, not elsewhere classified  Rationale for Evaluation and Treatment: Rehabilitation  ONSET DATE: chronic  SUBJECTIVE:                                                                                                                                                                                           SUBJECTIVE STATEMENT:  Pt states that she has continued consistently taking Miralax . She started working with nutritionist and is going to track bowel movements better to see if she can find any patterns. She is still walking on regular basis. She had some sharp abdominal pain yesterday after walking, but after several bowel movements and felt better.   PAIN: 09/12/23 Are you having pain? Yes NPRS scale: 4/10 Pain location: Lower abdominal pain, Lt sided pain, low back pain  Pain type: turning, knotted, pressure Pain description: intermittent and constant   Aggravating factors: constipation or frequent bowel movements/constantly having bowel movements  Relieving factors: exercises (bowel massage, happy baby, spinal twists, walking)  PRECAUTIONS: None  WEIGHT BEARING RESTRICTIONS: No  FALLS:  Has patient fallen in last 6 months? No  LIVING ENVIRONMENT: Lives with: lives with their spouse Lives in: House/apartment  OCCUPATION: nurse, in admin  PLOF: Independent  PATIENT GOALS: decrease pain and have more normal bowel movements  PERTINENT HISTORY:  Vaginal hysterectomy, DVT LE, endometriosis, interstitial cystitis, exploratory laps for endometriosis, c-section, cholecystectomy, RA diagnosed 06/2023 Sexual abuse: No  BOWEL MOVEMENT: Pain with bowel movement: Yes Type of bowel movement:Type (Bristol Stool Scale) 1-7 (sometimes full range in the same bowel movement), Frequency sometimes many times a day, sometimes up to 4 days in between, and Strain Yes Fully empty rectum:  No Leakage: No Pads: No Fiber supplement: No - has attempted metamucil and it made constipation worse  URINATION: Pain with urination: Yes Fully empty bladder: No Stream: varies  Urgency: Yes: all the time Frequency: multiple times an hours  Leakage: Urge to void, Walking to the bathroom, Coughing, Sneezing, Laughing, and Exercise Pads: Yes: daily  INTERCOURSE: Pain with intercourse: Initial Penetration  and During Penetration Ability to have vaginal penetration:  Yes: with pain Climax: non painful WNL   PREGNANCY: Vaginal deliveries 1 Tearing Yes: 3rd degree tear C-section deliveries 1 Currently pregnant No  PROLAPSE: Periodically will feel vaginal bulging, heaviness in lower abdomen   OBJECTIVE:  05/04/23:               Internal Pelvic Floor: mild tenderness in bil levator ani, but no increase in tension; with internal rectal exam, she was demonstrating paradoxical contraction when trying to eccentrically lengthen with exhale and had very hard time correcting  Patient confirms identification and approves PT to assess internal pelvic floor and treatment Yes  PELVIC MMT:   MMT eval  Vaginal 2/5, 3 second hold, 3 repeat contractions  Internal Anal Sphincter 1/5  External Anal Sphincter 2/5  Puborectalis 1/5  Diastasis Recti   (Blank rows = not tested)        TONE: WNL   PROLAPSE: Grade 2 anterior vaginal wall laxity  04/25/23: Curl-up test: abdominal distortion Transversus abdominus: week, difficulty getting active contraction (not been working on strengthening)    LUMBARAROM/PROM:  A/PROM A/PROM  Eval (% available) 11/09/22 (% available) 04/25/23 (% available)  Flexion 50 90 90  Extension 25, pain anteriorly  25, pain in low back 50  Right lateral flexion 50, pain anteriorly  75, pain on right 50, pain on Rt  Left lateral flexion 50, pain anteriorly  75, pain on right 75   (Blank rows = not tested)  11/09/22: No internal rectal or vaginal pelvic floor exam  performed due to high level of skin irritation   Weak transversus abdominus contraction  Abdominal restriction and tenderness in bil lower quadrant  Unable to perform pelvic tilt with appropriate coordination  LUMBARAROM/PROM:  A/PROM A/PROM  Eval (% available) 11/09/22 (% available)  Flexion 50 90  Extension 25, pain anteriorly  25, pain in low back  Right lateral flexion 50, pain anteriorly  75, pain on right  Left lateral flexion 50, pain anteriorly  75, pain on right   (Blank rows = not tested) 07/21/22: standing prolapse assessment demonstrated grade 2 anterior vaginal wall laxity   06/08/22:               External Perineal Exam: WNL                              Internal Pelvic Floor: burning reported with palpation of superficial muscles; deep aching/pressure with palpation of Lt levator ani   Patient confirms identification and approves PT to assess internal pelvic floor and treatment Yes  PELVIC MMT:   MMT eval  Vaginal 3/5  Internal Anal Sphincter 1/5  External Anal Sphincter 2/5  Puborectalis 1/5  Diastasis Recti   (Blank rows = not tested)        TONE: High in Lt levator ani   PROLAPSE: Not able to tell this session due to dyssynergic pelvic floor contraction     06/01/22: COGNITION: Overall cognitive status: Within functional limits for tasks assessed     SENSATION: Light touch: Appears intact Proprioception: Appears intact  GAIT: Comments: decreased hip extension, forward flexed trunk  POSTURE: rounded shoulders, forward head, decreased lumbar lordosis, increased thoracic kyphosis, posterior pelvic tilt, and flexed trunk    LUMBARAROM/PROM:  A/PROM A/PROM  Eval (% available)  Flexion 50  Extension 25, pain anteriorly   Right lateral flexion 50, pain anteriorly   Left  lateral flexion 50, pain anteriorly    (Blank rows = not tested)   PALPATION:   General  Significant abdominal restriction and tenderness; decreased rib mobility with  mobilization and breathing    TODAY'S TREATMENT:                                                                                                                              DATE:  09/13/23 Manual: ILU bowel mobilization  Abdominal scar tissue mobilization Ileocecal valve mobilization External rectum mobilization Myofascial release to abdomen with push/pull alternating forces horizontally  Diaphragm release All techniques performed in supine  Exercises: Lateral lunge stretch 5 x 10 seconds bil  Ball up wall 10x  Therapeutic activities: Heel raises 2 x 10 3 way kick kick, 10x each, bil   09/06/23 Manual: ILU bowel mobilization  Abdominal scar tissue mobilization Ileocecal valve mobilization External rectum mobilization Myofascial release to abdomen with push/pull alternating forces horizontally  Diaphragm release All techniques performed in supine  Neuromuscular re-education: Supine horizontal abduction/extension + red band 2 x 10 Supine unilateral bent knee fall out + green band 10x bil Exercises: Sidelying clam shells 2 x 10 bil Sidelying reverse clam shells 2 x 10 bil Supine bridge + hip adduction 2 x 10   08/30/23 Manual: ILU bowel mobilization  Abdominal scar tissue mobilization Ileocecal valve mobilization External rectum mobilization Myofascial release to abdomen with push/pull alternating forces horizontally  Diaphragm release All techniques performed in supine  Therapeutic activities: Tracking fiber to make sure she is getting 8-12g with each meal    PATIENT EDUCATION:  Education details: See above Person educated: Patient Education method: Explanation, Demonstration, Tactile cues, Verbal cues, and Handouts Education comprehension: verbalized understanding  HOME EXERCISE PROGRAM: [id number]  ASSESSMENT:  CLINICAL IMPRESSION: Pt doing better this week with having a little more productive bowel movements. She is being diligent with walking and other  exercises at home. Due to this, we progressed to slightly more functional strengthening training and mobility in session today with good tolerance. She tolerated all manual techniques well and they were used to continue assisting with good motility and productiveness of bowel movements. She will continue to benefit from skilled PT intervention in order to decrease pain, improve bowel movements, and decrease urinary urgency/incontinence.     OBJECTIVE IMPAIRMENTS: decreased activity tolerance, decreased coordination, decreased endurance, decreased mobility, decreased strength, increased fascial restrictions, increased muscle spasms, impaired tone, postural dysfunction, and pain.   ACTIVITY LIMITATIONS: lifting, bending, continence, and locomotion level  PARTICIPATION LIMITATIONS: interpersonal relationship, community activity, and occupation  PERSONAL FACTORS: 1 comorbidity: medical history are also affecting patient's functional outcome.   REHAB POTENTIAL: Good  CLINICAL DECISION MAKING: Stable/uncomplicated  EVALUATION COMPLEXITY: Low   GOALS: Goals reviewed with patient? Yes  SHORT TERM GOALS: Updated 08/01/23  Pt will be independent with HEP.   Baseline: Goal status: MET 07/12/22  2.  Pt will be independent with diaphragmatic breathing and down training activities  in order to improve pelvic floor relaxation.  Baseline:  Goal status: MET 07/12/22  3.  Pt will be independent with the knack, urge suppression technique, and double voiding in order to improve bladder habits and decrease urinary incontinence.   Baseline:  Goal status: MET 09/22/22  4.  Pt will be independent with use of squatty potty, relaxed toileting mechanics, and improved bowel movement techniques in order to increase ease of bowel movements and complete evacuation.   Baseline:  Goal status: MET 11/09/22  5.  Pt will be able to correctly perform diaphragmatic breathing and appropriate pressure management in order  to prevent worsening vaginal wall laxity and improve pelvic floor A/ROM.   Baseline:  Goal status: MET 01/18/23  LONG TERM GOALS: Updated 08/22/23  Pt will be independent with advanced HEP.   Baseline:  Goal status: IN PROGRESS 08/22/23  2.  Pt will demonstrate normal pelvic floor muscle tone and A/ROM, able to achieve 4/5 strength with contractions and 10 sec endurance, in order to provide appropriate lumbopelvic support in functional activities.   Baseline: not assessed today per patient request  Goal status: IN PROGRESS 08/22/23  3.  Pt will increase all impaired lumbar A/ROM by 25% without pain.  Baseline: Pt seen a little bit of loss in some and improvement in extension Goal status:  IN PROGRESS 08/22/23  4.  Pt will report pain no higher than 3/10 with any activity. Baseline: pain has gotten up to 5/10, but currently at a 3/10 Goal status:  IN PROGRESS 08/22/23  5.  Pt will report 0/10 pain with vaginal penetration in order to improve intimate relationship with partner.    Baseline: no recent intercourse attempts  Goal status:  IN PROGRESS 08/22/23  6.  Pt will be able to go 2-3 hours in between voids without urgency or incontinence in order to improve QOL and perform all functional activities with less difficulty.   Baseline:  Goal status: MET 02/21/23  7.  Pt will report no leaks with laughing, coughing, sneezing in order to improve comfort with interpersonal relationships and community activities.   Baseline: Pt states that she feels 30-40% better Goal status:  IN PROGRESS 08/22/23  8.  Pt will have consistent bowel movements 4-5x/week without straining or pain.  Baseline:  Goal status:  MET 02/21/23  PLAN:  PT FREQUENCY: 1-2x/week  PT DURATION: 6 months  PLANNED INTERVENTIONS: Therapeutic exercises, Therapeutic activity, Neuromuscular re-education, Balance training, Gait training, Patient/Family education, Self Care, Joint mobilization, Dry Needling, Biofeedback,  and Manual therapy  PLAN FOR NEXT SESSION: strengthening program - reinforce importance and go over some exercises each session; internal pelvic floor muscle exam  Josette Mares, PT, DPT09/15/258:05 AM

## 2023-09-19 ENCOUNTER — Ambulatory Visit (HOSPITAL_BASED_OUTPATIENT_CLINIC_OR_DEPARTMENT_OTHER)
Admission: RE | Admit: 2023-09-19 | Discharge: 2023-09-19 | Disposition: A | Discharge status: Home / Self Care | Source: Ambulatory Visit | Attending: Hematology & Oncology | Admitting: Hematology & Oncology

## 2023-09-19 ENCOUNTER — Encounter: Payer: Self-pay | Admitting: Physical Therapy

## 2023-09-19 ENCOUNTER — Ambulatory Visit: Admitting: Physical Therapy

## 2023-09-19 DIAGNOSIS — K6389 Other specified diseases of intestine: Secondary | ICD-10-CM | POA: Diagnosis not present

## 2023-09-19 DIAGNOSIS — S46812A Strain of other muscles, fascia and tendons at shoulder and upper arm level, left arm, initial encounter: Secondary | ICD-10-CM

## 2023-09-19 DIAGNOSIS — M6281 Muscle weakness (generalized): Secondary | ICD-10-CM

## 2023-09-19 DIAGNOSIS — Z90710 Acquired absence of both cervix and uterus: Secondary | ICD-10-CM | POA: Diagnosis not present

## 2023-09-19 DIAGNOSIS — M62838 Other muscle spasm: Secondary | ICD-10-CM

## 2023-09-19 DIAGNOSIS — R109 Unspecified abdominal pain: Secondary | ICD-10-CM | POA: Diagnosis not present

## 2023-09-19 DIAGNOSIS — M542 Cervicalgia: Secondary | ICD-10-CM

## 2023-09-19 DIAGNOSIS — K449 Diaphragmatic hernia without obstruction or gangrene: Secondary | ICD-10-CM | POA: Diagnosis not present

## 2023-09-19 DIAGNOSIS — Z9049 Acquired absence of other specified parts of digestive tract: Secondary | ICD-10-CM | POA: Diagnosis not present

## 2023-09-19 MED ORDER — IOHEXOL 300 MG/ML  SOLN
100.0000 mL | Freq: Once | INTRAMUSCULAR | Status: AC | PRN
  Administered 2023-09-19: 100 mL via INTRAVENOUS

## 2023-09-19 NOTE — Therapy (Signed)
 OUTPATIENT PHYSICAL THERAPY CERVICAL TREATMENT   Patient Name: Karen [name] MRN: [id number] DOB:08-28-1970, 53 y.o., female Today's Date: 09/19/2023  END OF SESSION:  PT End of Session - 09/19/23 1000     Visit Number 9   ortho count 7   Date for PT Re-Evaluation 10/03/23    Authorization Type [name]    Authorization Time Period signed dry needling consent form 08/08/23    PT Start Time 0848    PT Stop Time 0931    PT Time Calculation (min) 43 min    Activity Tolerance Patient tolerated treatment well    Behavior During Therapy [facility] for tasks assessed/performed                Past Medical History:  Diagnosis Date   Adenomyosis    Anemia    Arthritis 2013   L knee gets steroid injections    Arthritis, rheumatic, acute or subacute    Asthmatic bronchitis 01/31/2017   Back pain    Chronic constipation    Diarrhea    DVT of lower extremity (deep venous thrombosis) (HCC)    Dysmenorrhea    Endometriosis    Esophageal stricture    G6PD deficiency    GERD (gastroesophageal reflux disease)    History of kidney stones    History of uterine fibroid    IBS (irritable bowel syndrome)    Interstitial cystitis    Lactose intolerance    Migraine    with aura   Other hemochromatosis 06/10/2021   PONV (postoperative nausea and vomiting)    Recurrent upper respiratory infection (URI)    Sebaceous cyst of breast, right lower inner quadrant 10/25/2012   Excised 11/21/12    Sleep apnea    Syncope, non cardiac    Past Surgical History:  Procedure Laterality Date   ARTHROSCOPIC HAGLUNDS REPAIR     BREAST CYST EXCISION Right 11/21/2012   Procedure: CYST EXCISION BREAST;  Surgeon: Sherlean JINNY Laughter, MD;  Location: [facility];  Service: General;  Laterality: Right;   BREAST CYST EXCISION Bilateral 01/18/2022   Procedure: EXCISION OF SEBACEOUS CYST BILATERAL BREAST;  Surgeon: Vanderbilt Ned, MD;  Location: [facility];  Service:  General;  Laterality: Bilateral;   BREAST EXCISIONAL BIOPSY Right 11/2012   CESAREAN SECTION  04/19/1993   CHOLECYSTECTOMY     COLONOSCOPY  05/02/2011   Procedure: COLONOSCOPY;  Surgeon: Lynwood LITTIE Celestia Mickey., MD;  Location: [facility];  Service: Endoscopy;  Laterality: N/A;   colonoscopy  11/04/2014   DENTAL SURGERY  03/06/2018   2 surgeries ( 03/06/2018 and 04/06/2018)   to remove two separate benign tumors   ESOPHAGEAL MANOMETRY N/A 11/04/2015   Procedure: ESOPHAGEAL MANOMETRY (EM);  Surgeon: Lynwood Celestia, MD;  Location: [facility];  Service: Endoscopy;  Laterality: N/A;   ESOPHAGOGASTRODUODENOSCOPY  05/02/2011   Procedure: ESOPHAGOGASTRODUODENOSCOPY (EGD);  Surgeon: Lynwood LITTIE Celestia Mickey., MD;  Location: [facility];  Service: Endoscopy;  Laterality: N/A;   ESOPHAGOGASTRODUODENOSCOPY N/A 09/01/2014   Procedure: ESOPHAGOGASTRODUODENOSCOPY (EGD);  Surgeon: Lynwood Celestia, MD;  Location: [facility];  Service: Endoscopy;  Laterality: N/A;   IR CV LINE INJECTION  09/13/2023   IR IMAGING GUIDED PORT INSERTION  07/06/2023   KNEE SURGERY     LAPAROSCOPY     x 4   LESION REMOVAL N/A 01/18/2022   Procedure: EXCISION OF SEBACEOUS CYSTS CHEST AND NECK;  Surgeon: Vanderbilt Ned, MD;  Location: [facility];  Service: General;  Laterality:  N/A;   PH IMPEDANCE STUDY N/A 11/04/2015   Procedure: PH IMPEDANCE STUDY;  Surgeon: Lynwood Bohr, MD;  Location: [facility];  Service: Endoscopy;  Laterality: N/A;   VAGINAL HYSTERECTOMY  2010   TVH--ovaries remain   Patient Active Problem List   Diagnosis Date Noted   Sjogren syndrome with keratoconjunctivitis (HCC) 12/09/2022   Other hemochromatosis 06/10/2021   Elevated LFTs 04/04/2021   Colitis 04/04/2021   Bacterial overgrowth syndrome 05/21/2020   Obesity 05/21/2020   History of endometriosis 05/21/2020   Constipation due to outlet dysfunction 02/05/2020   Gastroesophageal reflux disease 02/05/2020   Obstructive sleep apnea treated with  continuous positive airway pressure (CPAP) 06/16/2017   Perennial allergic rhinitis with a nonallergic component 01/31/2017   Asthmatic bronchitis 01/31/2017   GI symptoms 01/31/2017   Family history of colon cancer 01/27/2015   Chest pain 12/13/2012   Dyspnea 12/13/2012   DVT of lower extremity (deep venous thrombosis) (HCC) 01/03/1990    PCP: Elliot Charm, MD   REFERRING PROVIDER: Huey Redell Fallow, PA-C  REFERRING DIAG: M54.2 (ICD-10-CM) - Cervicalgia  THERAPY DIAG:  Muscle weakness (generalized)  Other muscle spasm  Strain of left trapezius muscle, initial encounter  Cervicalgia  Rationale for Evaluation and Treatment: Rehabilitation  ONSET DATE: July 2025  SUBJECTIVE:                                                                                                                                                                                                         SUBJECTIVE STATEMENT: Patient presents with 2/10 pain today. She is scheduled to see a neck specialist on Friday.    Eval: Reports onset of L UT pain following port placement into R shoulder Hand dominance: Right  PERTINENT HISTORY:  07/21/2023 Assessment: - suspect left-sided cervical/ trapezius muscle spasm - reassured by absence of red flag signs/ symptoms at this time   Concurrent lymphedema PAIN: 08/29/23 Are you having pain? Yes: NPRS scale: 5/10 Pain location: L UT Pain description: ache, cramp Aggravating factors: activity Relieving factors: heat, medication  PRECAUTIONS: None  RED FLAGS: None     WEIGHT BEARING RESTRICTIONS: No  FALLS:  Has patient fallen in last 6 months? No  OCCUPATION: [employer] Employee  PLOF: Independent  PATIENT GOALS: To manage my neck pain  NEXT MD VISIT: TBD  OBJECTIVE:  Note: Objective measures were completed at Evaluation unless otherwise noted.  DIAGNOSTIC FINDINGS:  none  PATIENT SURVEYS:  NDI: 28/50 56% perceived  disability  Minimum Detectable Change (90% confidence): 5 points or 10% points  POSTURE: rounded shoulders and forward head  PALPATION: Global tenderness throughout B UT and posterior shoulder girdle   CERVICAL ROM:   Active ROM A/PROM (deg) eval  Flexion 75%P!  Extension 75%P!  Right lateral flexion 75%P!  Left lateral flexion 75%P!  Right rotation 75%P!  Left rotation 75%P!   (Blank rows = not tested)  UPPER EXTREMITY ROM:  Active ROM Right eval Left eval  Shoulder flexion 150 150  Shoulder extension    Shoulder abduction 150 150  Shoulder adduction    Shoulder extension    Shoulder internal rotation    Shoulder external rotation    Elbow flexion    Elbow extension    Wrist flexion    Wrist extension    Wrist ulnar deviation    Wrist radial deviation    Wrist pronation    Wrist supination     (Blank rows = not tested)  UPPER EXTREMITY MMT:  MMT Right eval Left eval  Shoulder flexion    Shoulder extension    Shoulder abduction    Shoulder adduction    Shoulder extension    Shoulder internal rotation    Shoulder external rotation    Middle trapezius    Lower trapezius    Elbow flexion    Elbow extension    Wrist flexion    Wrist extension    Wrist ulnar deviation    Wrist radial deviation    Wrist pronation    Wrist supination    Grip strength     (Blank rows = not tested)  CERVICAL SPECIAL TESTS:  Neck flexor muscle endurance test: Negative  FUNCTIONAL TESTS:  N/A  TREATMENT                     09/19/2023 Manual: seated soft tissue mobilization to bilateral upper trap, levator scap, bilateral cervical paraspinals  Seated shoulder circles x 10 each direction Standing pelvic tilt x 4 (patient braced whole body) Hooklying pelvic tilt x 20 Hooklying bent knee fall out x 10 bilateral  Hooklying TA activation + march x 10 bilateral  Cervical Melt method with foam roll (flexion/extension and rotation) x 20 each direction Chin tucks against  foam roll x 12 Seated reclined crunch x 8  Standing TA activation + march x 5 each    09/12/2023 Manual: seated soft tissue mobilization to bilateral upper trap, levator scap, bilateral cervical paraspinals  Upper trap stretch 2 x 20 sec bilateral  Scalene 2 x 20 sec bilateral  Cervical Melt method with foam roll (flexion/extension and rotation) x 20 each direction Chin tucks against foam roll x 12 Supine scap squeezes x 12 Supine serratus punch x 12 bilateral  Hooklying PPT 2 x 10 Bent knee fall out + TA activation x 10 bilateral  Supine shoulder extension isometric 2 x 10 3 sec hold     09/07/2023 Manual: seated soft tissue mobilization to bilateral upper trap, levator scap, bilateral cervical paraspinals and bilateral subocciptals Shoulder circles x 10 each direction Chin tucks x 10 Seated scap squeeze x 10  Pool noodle between shoulder blades + shoulder flexion x 10 Cervical Melt method with foam roll (flexion/extension and rotation) x 20 each direction Cervical chin tuck isometric 2  x 10 3 sec hold Supine shoulder extension isometric 2 x 10 3 sec hold    08/29/2023 Discussion of current status and response to treatment  Manual therapy: supine soft tissue mobilization to left upper trap, levator scap, left cervical paraspinals and left  subocciptals Education on benefits of meditation for muscle relaxation as well as deep breathing Awareness of shoulder hiking as a stress response Use of Theracane for trigger point release to left upper trap and levator Back to wall with lacrosse ball pinned to the wall:  cervical rotation right/left, rotation/lateral flexion, left UE elevation, left horizontal adduction 5x each                                                                                                        PATIENT EDUCATION:  Education details: Discussed eval findings, rehab rationale and POC and patient is in agreement  Person educated: Patient Education method:  Explanation and Handouts Education comprehension: verbalized understanding and needs further education  HOME EXERCISE PROGRAM: Access Code: [email or web address] URL: [email or web address] Date: 09/19/2023 Prepared by: Kristeen Sar  Exercises - Seated Shoulder Shrugs  - 2 x daily - 5 x weekly - 2 sets - 10 reps - Seated Scapular Retraction  - 2 x daily - 5 x weekly - 2 sets - 10 reps - Gentle Levator Scapulae Stretch  - 1 x daily - 7 x weekly - 1 sets - 2 reps - 30 seconds hold - Seated Cervical Sidebending Stretch  - 1 x daily - 7 x weekly - 1 sets - 2 reps - 30 seconds hold - Quadricep Stretch with Chair and Counter Support  - 1 x daily - 7 x weekly - 2 sets - 20-30s hold - Standing Hamstring Stretch with Step  - 1 x daily - 7 x weekly - 2 sets - 20-30s hold - Seated Hamstring Stretch  - 1 x daily - 7 x weekly - 2 sets - 20-30 hold - Seated Hip Flexor Stretch  - 1 x daily - 7 x weekly - 2 sets - 20-30 hold - Seated Scalene Stretch with Towel  - 1 x daily - 7 x weekly - 2 sets - 20 reps - Seated Modified Small Range Crunch in Chair  - 1 x daily - 7 x weekly - 1-2 sets - 10 reps - Standing Marching  - 1 x daily - 7 x weekly - 1 sets - 10 reps ASSESSMENT:  CLINICAL IMPRESSION: Anneth presents with 2/10 pain today. Incorporated TA activation exercises today for improved core engagement when walking. Attempted to perform in standing and patient engaged whole body so opted to perform hooklying instead. Patient tolerated progressions incorporated leg movements well. Updated HEP to include exercises for patient to perform before she walks. She is scheduled to see a neck specialist on Friday. Patient will benefit from skilled PT to address the below impairments and improve overall function.     OBJECTIVE IMPAIRMENTS: decreased activity tolerance, decreased mobility, decreased ROM, increased fascial restrictions, increased muscle spasms, impaired UE functional use, postural dysfunction, and  pain.   ACTIVITY LIMITATIONS: carrying, lifting, bending, bed mobility, and reach over head  PERSONAL FACTORS: Age, Fitness, Past/current experiences, and 1 comorbidity: lymphedema are also affecting patient's functional outcome.   REHAB POTENTIAL: Good  CLINICAL DECISION MAKING: Stable/uncomplicated  EVALUATION COMPLEXITY: Low  GOALS: Goals reviewed with patient? No    SHORT TERM GOALS=LONG TERM GOALS: Target date: 10/03/23  Patient to demonstrate independence in HEP  Baseline: [id number] Goal status: INITIAL  2.  Patient will acknowledge 4/10 pain at least once during episode of care   Baseline: 8/10 Goal status: INITIAL  3.  Patient will score at least 20/50 on NDI to signify clinically meaningful improvement in functional abilities.   Baseline: 28/50 Goal status: INITIAL  4.  160d AROM in B shoulder flexion/abduction Baseline: 150d Goal status: INITIAL  5.  75% AROM cervical spine with minimal pain Baseline: 75% with moderate pain Goal status: INITIAL   PLAN:  PT FREQUENCY: 1-2x/week  PT DURATION: 6 weeks  PLANNED INTERVENTIONS: 97110-Therapeutic exercises, 97530- Therapeutic activity, 97112- Neuromuscular re-education, 97535- Self Care, and 02859- Manual therapy  PLAN FOR NEXT SESSION:  ask about MD appointment; two session left until end of POC; postural strengthening; manual techniques as appropriate, aerobic tasks, ROM and flexibility activities  Kristeen Sar, PT 09/19/23 10:01 AM

## 2023-09-20 ENCOUNTER — Ambulatory Visit: Payer: Self-pay

## 2023-09-20 ENCOUNTER — Ambulatory Visit: Payer: Self-pay | Admitting: Hematology & Oncology

## 2023-09-20 ENCOUNTER — Ambulatory Visit (INDEPENDENT_AMBULATORY_CARE_PROVIDER_SITE_OTHER): Admitting: Dermatology

## 2023-09-20 VITALS — BP 135/76

## 2023-09-20 DIAGNOSIS — R102 Pelvic and perineal pain: Secondary | ICD-10-CM | POA: Diagnosis not present

## 2023-09-20 DIAGNOSIS — D1722 Benign lipomatous neoplasm of skin and subcutaneous tissue of left arm: Secondary | ICD-10-CM

## 2023-09-20 DIAGNOSIS — M62838 Other muscle spasm: Secondary | ICD-10-CM | POA: Diagnosis not present

## 2023-09-20 DIAGNOSIS — D179 Benign lipomatous neoplasm, unspecified: Secondary | ICD-10-CM

## 2023-09-20 DIAGNOSIS — D1721 Benign lipomatous neoplasm of skin and subcutaneous tissue of right arm: Secondary | ICD-10-CM

## 2023-09-20 DIAGNOSIS — R279 Unspecified lack of coordination: Secondary | ICD-10-CM

## 2023-09-20 DIAGNOSIS — M6281 Muscle weakness (generalized): Secondary | ICD-10-CM

## 2023-09-20 DIAGNOSIS — L720 Epidermal cyst: Secondary | ICD-10-CM | POA: Diagnosis not present

## 2023-09-20 DIAGNOSIS — M5459 Other low back pain: Secondary | ICD-10-CM

## 2023-09-20 DIAGNOSIS — L729 Follicular cyst of the skin and subcutaneous tissue, unspecified: Secondary | ICD-10-CM

## 2023-09-20 NOTE — Progress Notes (Signed)
   Follow-Up Visit   Subjective  Karen [name] is a 53 y.o. female who presents for the following: She nodules of her arms and abdomen that she has noticed over the last couple of months. She does have a history of cysts but she is not sure these are cysts because they feel deeper than her previous cysts. She was recently diagnosed with RA and she is on Humira .   The following portions of the chart were reviewed this encounter and updated as appropriate: medications, allergies, medical history  Review of Systems:  No other skin or systemic complaints except as noted in HPI or Assessment and Plan.  Objective  Well appearing patient in no apparent distress; mood and affect are within normal limits.   A focused examination was performed of the following areas: Arms and abdomen   Relevant exam findings are noted in the Assessment and Plan.    Assessment & Plan   Lipoma  Exam: Subcutaneous rubbery nodules Location: Bilateral forearms, abdomen  Benign-appearing. Exam most consistent with an Lipoma. Discussed that a Lipoma is a benign fatty growth that can grow over time and sometimes become painful or otherwise symptomatic. Some patients may have one or several lipomas.. Benign Hereditary Lipomatosis is a hereditary familial condition where family members tend to grow multiple lipomas.  Recommend observation if it is not changing, growing or symptomatic. Recommend surgical excision to remove it if it is painful, growing, symptomatic, or other changes noted. Please contact our office for new or changing lesions so they can be evaluated.  - Assessment: Patient presents with multiple new lipomas on abdomen, bilateral forearms, and back. Largest lesion on stomach. Mildly tender (3/10) when touched. May be related to trauma from frequent needle sticks. Clinically benign appearing without concerning features. - Plan:    Monitor lipomas for growth or increased tenderness      Surgical excision  options available if lipomas become problematic   EPIDERMAL INCLUSION CYST Exam: Subcutaneous nodule at back  Benign-appearing. Exam most consistent with an epidermal inclusion cyst. Discussed that a cyst is a benign growth that can grow over time and sometimes get irritated or inflamed. Recommend observation if it is not bothersome. Discussed option of surgical excision to remove it if it is growing, symptomatic, or other changes noted. Please call for new or changing lesions so they can be evaluated.  Milia - tiny firm white papules - type of cyst - benign - sometimes these will clear with nightly OTC adapalene/Differin 0.1% gel or retinol. - may be extracted if symptomatic - observe - 2 of forehead extracted today    Return for Follow up as scheduled, [facility].  I, Roseline Hutchinson, CMA, am acting as scribe for [employer], DO .   Documentation: I have reviewed the above documentation for accuracy and completeness, and I agree with the above.  Delon Lenis, DO

## 2023-09-20 NOTE — Patient Instructions (Addendum)
 Date: Wed Sep 20, 2023  Hello Maecyn,  Thank you for visiting today. Here is a summary of the key instructions:  - Medications:   - Continue taking Humira  as prescribed for rheumatoid arthritis   - Allow 4-6 months to see improvement from Humira   - Skin Care:   - Monitor lipomas for growth or increased tenderness   - Use Q-tips to gently press on removed cysts when cleaning   - Avoid squeezing the small epidermal occlusion cyst on the back  - Follow-up:   - Return in February for full body skin check   - Discuss any concerns about lipomas at the next appointment  - Other Instructions:   - TPA procedure scheduled in two weeks during lab work for port-related blood clot  Please reach out if you have any questions or concerns.  Warm regards,  Dr. Delon Lenis [facility]   Important Information  Due to recent changes in healthcare laws, you may see results of your pathology and/or laboratory studies on MyChart before the doctors have had a chance to review them. We understand that in some cases there may be results that are confusing or concerning to you. Please understand that not all results are received at the same time and often the doctors may need to interpret multiple results in order to provide you with the best plan of care or course of treatment. Therefore, we ask that you please give us  2 business days to thoroughly review all your results before contacting the office for clarification. Should we see a critical lab result, you will be contacted sooner.   If You Need Anything After Your Visit  If you have any questions or concerns for your doctor, please call our main line at [phone] If no one answers, please leave a voicemail as directed and we will return your call as soon as possible. Messages left after 4 pm will be answered the following business day.   You may also send us  a message via MyChart. We typically respond to MyChart messages within 1-2 business  days.  For prescription refills, please ask your pharmacy to contact our office. Our fax number is [phone].  If you have an urgent issue when the clinic is closed that cannot wait until the next business day, you can page your doctor at the number below.    Please note that while we do our best to be available for urgent issues outside of office hours, we are not available 24/7.   If you have an urgent issue and are unable to reach us , you may choose to seek medical care at your doctor's office, retail clinic, urgent care center, or emergency room.  If you have a medical emergency, please immediately call 911 or go to the emergency department. In the event of inclement weather, please call our main line at [phone] for an update on the status of any delays or closures.  Dermatology Medication Tips: Please keep the boxes that topical medications come in in order to help keep track of the instructions about where and how to use these. Pharmacies typically print the medication instructions only on the boxes and not directly on the medication tubes.   If your medication is too expensive, please contact our office at [phone] or send us  a message through MyChart.   We are unable to tell what your co-pay for medications will be in advance as this is different depending on your insurance coverage. However, we may be able to  find a substitute medication at lower cost or fill out paperwork to get insurance to cover a needed medication.   If a prior authorization is required to get your medication covered by your insurance company, please allow us  1-2 business days to complete this process.  Drug prices often vary depending on where the prescription is filled and some pharmacies may offer cheaper prices.  The website www.goodrx.com contains coupons for medications through different pharmacies. The prices here do not account for what the cost may be with help from insurance (it may be  cheaper with your insurance), but the website can give you the price if you did not use any insurance.  - You can print the associated coupon and take it with your prescription to the pharmacy.  - You may also stop by our office during regular business hours and pick up a GoodRx coupon card.  - If you need your prescription sent electronically to a different pharmacy, notify our office through [facility] or by phone at [phone]

## 2023-09-20 NOTE — Therapy (Signed)
 OUTPATIENT PHYSICAL THERAPY FEMALE PELVIC TREATMENT   Patient Name: Karen [name] MRN: [id number] DOB:1970-12-20, 53 y.o., female Today's Date: 09/20/2023  END OF SESSION:  PT End of Session - 09/20/23 0942     Visit Number 50    Date for PT Re-Evaluation 10/10/23    Authorization Type [name]    Authorization Time Period signed dry needling consent form 08/08/23    PT Start Time 0940    PT Stop Time 1012    PT Time Calculation (min) 32 min    Activity Tolerance Patient tolerated treatment well    Behavior During Therapy [facility] for tasks assessed/performed                     Past Medical History:  Diagnosis Date   Adenomyosis    Anemia    Arthritis 2013   L knee gets steroid injections    Arthritis, rheumatic, acute or subacute    Asthmatic bronchitis 01/31/2017   Back pain    Chronic constipation    Diarrhea    DVT of lower extremity (deep venous thrombosis) (HCC)    Dysmenorrhea    Endometriosis    Esophageal stricture    G6PD deficiency    GERD (gastroesophageal reflux disease)    History of kidney stones    History of uterine fibroid    IBS (irritable bowel syndrome)    Interstitial cystitis    Lactose intolerance    Migraine    with aura   Other hemochromatosis 06/10/2021   PONV (postoperative nausea and vomiting)    Recurrent upper respiratory infection (URI)    Sebaceous cyst of breast, right lower inner quadrant 10/25/2012   Excised 11/21/12    Sleep apnea    Syncope, non cardiac    Past Surgical History:  Procedure Laterality Date   ARTHROSCOPIC HAGLUNDS REPAIR     BREAST CYST EXCISION Right 11/21/2012   Procedure: CYST EXCISION BREAST;  Surgeon: Sherlean JINNY Laughter, MD;  Location: [facility];  Service: General;  Laterality: Right;   BREAST CYST EXCISION Bilateral 01/18/2022   Procedure: EXCISION OF SEBACEOUS CYST BILATERAL BREAST;  Surgeon: Vanderbilt Ned, MD;  Location: [facility];  Service:  General;  Laterality: Bilateral;   BREAST EXCISIONAL BIOPSY Right 11/2012   CESAREAN SECTION  04/19/1993   CHOLECYSTECTOMY     COLONOSCOPY  05/02/2011   Procedure: COLONOSCOPY;  Surgeon: Lynwood LITTIE Celestia Mickey., MD;  Location: [facility];  Service: Endoscopy;  Laterality: N/A;   colonoscopy  11/04/2014   DENTAL SURGERY  03/06/2018   2 surgeries ( 03/06/2018 and 04/06/2018)   to remove two separate benign tumors   ESOPHAGEAL MANOMETRY N/A 11/04/2015   Procedure: ESOPHAGEAL MANOMETRY (EM);  Surgeon: Lynwood Celestia, MD;  Location: [facility];  Service: Endoscopy;  Laterality: N/A;   ESOPHAGOGASTRODUODENOSCOPY  05/02/2011   Procedure: ESOPHAGOGASTRODUODENOSCOPY (EGD);  Surgeon: Lynwood LITTIE Celestia Mickey., MD;  Location: [facility];  Service: Endoscopy;  Laterality: N/A;   ESOPHAGOGASTRODUODENOSCOPY N/A 09/01/2014   Procedure: ESOPHAGOGASTRODUODENOSCOPY (EGD);  Surgeon: Lynwood Celestia, MD;  Location: [facility];  Service: Endoscopy;  Laterality: N/A;   IR CV LINE INJECTION  09/13/2023   IR IMAGING GUIDED PORT INSERTION  07/06/2023   KNEE SURGERY     LAPAROSCOPY     x 4   LESION REMOVAL N/A 01/18/2022   Procedure: EXCISION OF SEBACEOUS CYSTS CHEST AND NECK;  Surgeon: Vanderbilt Ned, MD;  Location: [facility];  Service: General;  Laterality: N/A;   PH IMPEDANCE STUDY N/A 11/04/2015   Procedure: PH IMPEDANCE STUDY;  Surgeon: Lynwood Bohr, MD;  Location: [facility];  Service: Endoscopy;  Laterality: N/A;   VAGINAL HYSTERECTOMY  2010   TVH--ovaries remain   Patient Active Problem List   Diagnosis Date Noted   Sjogren syndrome with keratoconjunctivitis (HCC) 12/09/2022   Other hemochromatosis 06/10/2021   Elevated LFTs 04/04/2021   Colitis 04/04/2021   Bacterial overgrowth syndrome 05/21/2020   Obesity 05/21/2020   History of endometriosis 05/21/2020   Constipation due to outlet dysfunction 02/05/2020   Gastroesophageal reflux disease 02/05/2020   Obstructive sleep apnea treated with  continuous positive airway pressure (CPAP) 06/16/2017   Perennial allergic rhinitis with a nonallergic component 01/31/2017   Asthmatic bronchitis 01/31/2017   GI symptoms 01/31/2017   Family history of colon cancer 01/27/2015   Chest pain 12/13/2012   Dyspnea 12/13/2012   DVT of lower extremity (deep venous thrombosis) (HCC) 01/03/1990    PCP: Elliot Charm, MD  REFERRING PROVIDER: Cleotilde Ronal RAMAN, MD   REFERRING DIAG: M62.89 (ICD-10-CM) - Pelvic floor dysfunction  THERAPY DIAG:  Muscle weakness (generalized)  Other muscle spasm  Other low back pain  Pelvic pain  Unspecified lack of coordination  Rationale for Evaluation and Treatment: Rehabilitation  ONSET DATE: chronic  SUBJECTIVE:                                                                                                                                                                                           SUBJECTIVE STATEMENT:  Pt states that bowel movements are doing very well and getting more productive. She titrates the Miralax  depending on diarrhea. She has been increasing fiber and decreasing water some based upon the dieticians recommendations. She is working on looking at dietary triggers that may impact bowel movements. She was able to get 6 walks in last week.   PAIN: 09/20/23 Are you having pain? Yes NPRS scale: 4/10 Pain location: Lower abdominal pain, Lt sided pain, low back pain  Pain type: turning, knotted, pressure Pain description: intermittent and constant   Aggravating factors: constipation or frequent bowel movements/constantly having bowel movements  Relieving factors: exercises (bowel massage, happy baby, spinal twists, walking)  PRECAUTIONS: None  WEIGHT BEARING RESTRICTIONS: No  FALLS:  Has patient fallen in last 6 months? No  LIVING ENVIRONMENT: Lives with: lives with their spouse Lives in: House/apartment  OCCUPATION: nurse, in admin  PLOF: Independent  PATIENT  GOALS: decrease pain and have more normal bowel movements  PERTINENT HISTORY:  Vaginal hysterectomy, DVT LE, endometriosis, interstitial cystitis, exploratory laps for endometriosis, c-section, cholecystectomy, RA diagnosed 06/2023  Sexual abuse: No  BOWEL MOVEMENT: Pain with bowel movement: Yes Type of bowel movement:Type (Bristol Stool Scale) 1-7 (sometimes full range in the same bowel movement), Frequency sometimes many times a day, sometimes up to 4 days in between, and Strain Yes Fully empty rectum: No Leakage: No Pads: No Fiber supplement: No - has attempted metamucil and it made constipation worse  URINATION: Pain with urination: Yes Fully empty bladder: No Stream: varies  Urgency: Yes: all the time Frequency: multiple times an hours  Leakage: Urge to void, Walking to the bathroom, Coughing, Sneezing, Laughing, and Exercise Pads: Yes: daily  INTERCOURSE: Pain with intercourse: Initial Penetration and During Penetration Ability to have vaginal penetration:  Yes: with pain Climax: non painful WNL   PREGNANCY: Vaginal deliveries 1 Tearing Yes: 3rd degree tear C-section deliveries 1 Currently pregnant No  PROLAPSE: Periodically will feel vaginal bulging, heaviness in lower abdomen   OBJECTIVE:  05/04/23:               Internal Pelvic Floor: mild tenderness in bil levator ani, but no increase in tension; with internal rectal exam, she was demonstrating paradoxical contraction when trying to eccentrically lengthen with exhale and had very hard time correcting  Patient confirms identification and approves PT to assess internal pelvic floor and treatment Yes  PELVIC MMT:   MMT eval  Vaginal 2/5, 3 second hold, 3 repeat contractions  Internal Anal Sphincter 1/5  External Anal Sphincter 2/5  Puborectalis 1/5  Diastasis Recti   (Blank rows = not tested)        TONE: WNL   PROLAPSE: Grade 2 anterior vaginal wall laxity  04/25/23: Curl-up test: abdominal  distortion Transversus abdominus: week, difficulty getting active contraction (not been working on strengthening)    LUMBARAROM/PROM:  A/PROM A/PROM  Eval (% available) 11/09/22 (% available) 04/25/23 (% available)  Flexion 50 90 90  Extension 25, pain anteriorly  25, pain in low back 50  Right lateral flexion 50, pain anteriorly  75, pain on right 50, pain on Rt  Left lateral flexion 50, pain anteriorly  75, pain on right 75   (Blank rows = not tested)  11/09/22: No internal rectal or vaginal pelvic floor exam performed due to high level of skin irritation   Weak transversus abdominus contraction  Abdominal restriction and tenderness in bil lower quadrant  Unable to perform pelvic tilt with appropriate coordination  LUMBARAROM/PROM:  A/PROM A/PROM  Eval (% available) 11/09/22 (% available)  Flexion 50 90  Extension 25, pain anteriorly  25, pain in low back  Right lateral flexion 50, pain anteriorly  75, pain on right  Left lateral flexion 50, pain anteriorly  75, pain on right   (Blank rows = not tested) 07/21/22: standing prolapse assessment demonstrated grade 2 anterior vaginal wall laxity   06/08/22:               External Perineal Exam: WNL                              Internal Pelvic Floor: burning reported with palpation of superficial muscles; deep aching/pressure with palpation of Lt levator ani   Patient confirms identification and approves PT to assess internal pelvic floor and treatment Yes  PELVIC MMT:   MMT eval  Vaginal 3/5  Internal Anal Sphincter 1/5  External Anal Sphincter 2/5  Puborectalis 1/5  Diastasis Recti   (Blank rows = not tested)  TONE: High in Lt levator ani   PROLAPSE: Not able to tell this session due to dyssynergic pelvic floor contraction     06/01/22: COGNITION: Overall cognitive status: Within functional limits for tasks assessed     SENSATION: Light touch: Appears intact Proprioception: Appears  intact  GAIT: Comments: decreased hip extension, forward flexed trunk  POSTURE: rounded shoulders, forward head, decreased lumbar lordosis, increased thoracic kyphosis, posterior pelvic tilt, and flexed trunk    LUMBARAROM/PROM:  A/PROM A/PROM  Eval (% available)  Flexion 50  Extension 25, pain anteriorly   Right lateral flexion 50, pain anteriorly   Left lateral flexion 50, pain anteriorly    (Blank rows = not tested)   PALPATION:   General  Significant abdominal restriction and tenderness; decreased rib mobility with mobilization and breathing    TODAY'S TREATMENT:                                                                                                                              DATE:  09/20/23 Manual: ILU bowel mobilization  Abdominal scar tissue mobilization Ileocecal valve mobilization External rectum mobilization Myofascial release to abdomen with push/pull alternating forces horizontally  Diaphragm release All techniques performed in supine   09/13/23 Manual: ILU bowel mobilization  Abdominal scar tissue mobilization Ileocecal valve mobilization External rectum mobilization Myofascial release to abdomen with push/pull alternating forces horizontally  Diaphragm release All techniques performed in supine  Exercises: Lateral lunge stretch 5 x 10 seconds bil  Ball up wall 10x  Therapeutic activities: Heel raises 2 x 10 3 way kick kick, 10x each, bil   09/06/23 Manual: ILU bowel mobilization  Abdominal scar tissue mobilization Ileocecal valve mobilization External rectum mobilization Myofascial release to abdomen with push/pull alternating forces horizontally  Diaphragm release All techniques performed in supine  Neuromuscular re-education: Supine horizontal abduction/extension + red band 2 x 10 Supine unilateral bent knee fall out + green band 10x bil Exercises: Sidelying clam shells 2 x 10 bil Sidelying reverse clam shells 2 x 10 bil Supine  bridge + hip adduction 2 x 10    PATIENT EDUCATION:  Education details: See above Person educated: Patient Education method: Explanation, Demonstration, Tactile cues, Verbal cues, and Handouts Education comprehension: verbalized understanding  HOME EXERCISE PROGRAM: [id number]  ASSESSMENT:  CLINICAL IMPRESSION: Pt has had a very good week with improved emptying and regular bowel movements. This has lead to improved comfort. She has been able to be more active due to this. She still feels like something is going on with her abdomen and she is having a lot of blaoting swelling. Manual techniques performed with good tolerance, but she does have notable restriction at midline; myofascial release performed here with some good improvements. She will continue to benefit from skilled PT intervention in order to decrease pain, improve bowel movements, and decrease urinary urgency/incontinence.     OBJECTIVE IMPAIRMENTS: decreased activity tolerance, decreased coordination, decreased endurance, decreased mobility, decreased strength, increased  fascial restrictions, increased muscle spasms, impaired tone, postural dysfunction, and pain.   ACTIVITY LIMITATIONS: lifting, bending, continence, and locomotion level  PARTICIPATION LIMITATIONS: interpersonal relationship, community activity, and occupation  PERSONAL FACTORS: 1 comorbidity: medical history are also affecting patient's functional outcome.   REHAB POTENTIAL: Good  CLINICAL DECISION MAKING: Stable/uncomplicated  EVALUATION COMPLEXITY: Low   GOALS: Goals reviewed with patient? Yes  SHORT TERM GOALS: Updated 08/01/23  Pt will be independent with HEP.   Baseline: Goal status: MET 07/12/22  2.  Pt will be independent with diaphragmatic breathing and down training activities in order to improve pelvic floor relaxation.  Baseline:  Goal status: MET 07/12/22  3.  Pt will be independent with the knack, urge suppression technique, and  double voiding in order to improve bladder habits and decrease urinary incontinence.   Baseline:  Goal status: MET 09/22/22  4.  Pt will be independent with use of squatty potty, relaxed toileting mechanics, and improved bowel movement techniques in order to increase ease of bowel movements and complete evacuation.   Baseline:  Goal status: MET 11/09/22  5.  Pt will be able to correctly perform diaphragmatic breathing and appropriate pressure management in order to prevent worsening vaginal wall laxity and improve pelvic floor A/ROM.   Baseline:  Goal status: MET 01/18/23  LONG TERM GOALS: Updated 08/22/23  Pt will be independent with advanced HEP.   Baseline:  Goal status: IN PROGRESS 08/22/23  2.  Pt will demonstrate normal pelvic floor muscle tone and A/ROM, able to achieve 4/5 strength with contractions and 10 sec endurance, in order to provide appropriate lumbopelvic support in functional activities.   Baseline: not assessed today per patient request  Goal status: IN PROGRESS 08/22/23  3.  Pt will increase all impaired lumbar A/ROM by 25% without pain.  Baseline: Pt seen a little bit of loss in some and improvement in extension Goal status:  IN PROGRESS 08/22/23  4.  Pt will report pain no higher than 3/10 with any activity. Baseline: pain has gotten up to 5/10, but currently at a 3/10 Goal status:  IN PROGRESS 08/22/23  5.  Pt will report 0/10 pain with vaginal penetration in order to improve intimate relationship with partner.    Baseline: no recent intercourse attempts  Goal status:  IN PROGRESS 08/22/23  6.  Pt will be able to go 2-3 hours in between voids without urgency or incontinence in order to improve QOL and perform all functional activities with less difficulty.   Baseline:  Goal status: MET 02/21/23  7.  Pt will report no leaks with laughing, coughing, sneezing in order to improve comfort with interpersonal relationships and community activities.   Baseline: Pt  states that she feels 30-40% better Goal status:  IN PROGRESS 08/22/23  8.  Pt will have consistent bowel movements 4-5x/week without straining or pain.  Baseline:  Goal status:  MET 02/21/23  PLAN:  PT FREQUENCY: 1-2x/week  PT DURATION: 6 months  PLANNED INTERVENTIONS: Therapeutic exercises, Therapeutic activity, Neuromuscular re-education, Balance training, Gait training, Patient/Family education, Self Care, Joint mobilization, Dry Needling, Biofeedback, and Manual therapy  PLAN FOR NEXT SESSION: strengthening program - reinforce importance and go over some exercises each session; internal pelvic floor muscle exam  Josette Mares, PT, DPT09/17/2510:16 AM

## 2023-09-22 ENCOUNTER — Other Ambulatory Visit (HOSPITAL_COMMUNITY): Payer: Self-pay

## 2023-09-22 DIAGNOSIS — M542 Cervicalgia: Secondary | ICD-10-CM | POA: Diagnosis not present

## 2023-09-22 MED ORDER — PREDNISONE 5 MG PO TABS
ORAL_TABLET | ORAL | 0 refills | Status: AC
  Filled 2023-09-22: qty 21, 6d supply, fill #0

## 2023-09-25 ENCOUNTER — Other Ambulatory Visit: Payer: Self-pay | Admitting: Allergy & Immunology

## 2023-09-25 ENCOUNTER — Other Ambulatory Visit (HOSPITAL_COMMUNITY): Payer: Self-pay

## 2023-09-25 ENCOUNTER — Ambulatory Visit

## 2023-09-25 DIAGNOSIS — M6281 Muscle weakness (generalized): Secondary | ICD-10-CM | POA: Diagnosis not present

## 2023-09-25 DIAGNOSIS — R279 Unspecified lack of coordination: Secondary | ICD-10-CM

## 2023-09-25 DIAGNOSIS — M62838 Other muscle spasm: Secondary | ICD-10-CM

## 2023-09-25 DIAGNOSIS — M5459 Other low back pain: Secondary | ICD-10-CM | POA: Diagnosis not present

## 2023-09-25 DIAGNOSIS — R102 Pelvic and perineal pain: Secondary | ICD-10-CM | POA: Diagnosis not present

## 2023-09-25 MED ORDER — LEVOCETIRIZINE DIHYDROCHLORIDE 5 MG PO TABS
5.0000 mg | ORAL_TABLET | Freq: Every evening | ORAL | 1 refills | Status: AC
  Filled 2023-09-25: qty 90, 90d supply, fill #0
  Filled 2023-12-21: qty 90, 90d supply, fill #1

## 2023-09-25 NOTE — Therapy (Signed)
 OUTPATIENT PHYSICAL THERAPY FEMALE PELVIC TREATMENT   Patient Name: Karen [name] MRN: [id number] DOB:1970-03-05, 53 y.o., female Today's Date: 09/25/2023  END OF SESSION:  PT End of Session - 09/25/23 0847     Visit Number 51    Date for Recertification  10/10/23    Authorization Type [name]    Authorization Time Period signed dry needling consent form 08/08/23    PT Start Time 0847    PT Stop Time 0928    PT Time Calculation (min) 41 min    Activity Tolerance Patient tolerated treatment well    Behavior During Therapy [facility] for tasks assessed/performed                     Past Medical History:  Diagnosis Date   Adenomyosis    Anemia    Arthritis 2013   L knee gets steroid injections    Arthritis, rheumatic, acute or subacute    Asthmatic bronchitis 01/31/2017   Back pain    Chronic constipation    Diarrhea    DVT of lower extremity (deep venous thrombosis) (HCC)    Dysmenorrhea    Endometriosis    Esophageal stricture    G6PD deficiency    GERD (gastroesophageal reflux disease)    History of kidney stones    History of uterine fibroid    IBS (irritable bowel syndrome)    Interstitial cystitis    Lactose intolerance    Migraine    with aura   Other hemochromatosis 06/10/2021   PONV (postoperative nausea and vomiting)    Recurrent upper respiratory infection (URI)    Sebaceous cyst of breast, right lower inner quadrant 10/25/2012   Excised 11/21/12    Sleep apnea    Syncope, non cardiac    Past Surgical History:  Procedure Laterality Date   ARTHROSCOPIC HAGLUNDS REPAIR     BREAST CYST EXCISION Right 11/21/2012   Procedure: CYST EXCISION BREAST;  Surgeon: Sherlean JINNY Laughter, MD;  Location: [facility];  Service: General;  Laterality: Right;   BREAST CYST EXCISION Bilateral 01/18/2022   Procedure: EXCISION OF SEBACEOUS CYST BILATERAL BREAST;  Surgeon: Vanderbilt Ned, MD;  Location: [facility];  Service:  General;  Laterality: Bilateral;   BREAST EXCISIONAL BIOPSY Right 11/2012   CESAREAN SECTION  04/19/1993   CHOLECYSTECTOMY     COLONOSCOPY  05/02/2011   Procedure: COLONOSCOPY;  Surgeon: Lynwood LITTIE Celestia Mickey., MD;  Location: [facility];  Service: Endoscopy;  Laterality: N/A;   colonoscopy  11/04/2014   DENTAL SURGERY  03/06/2018   2 surgeries ( 03/06/2018 and 04/06/2018)   to remove two separate benign tumors   ESOPHAGEAL MANOMETRY N/A 11/04/2015   Procedure: ESOPHAGEAL MANOMETRY (EM);  Surgeon: Lynwood Celestia, MD;  Location: [facility];  Service: Endoscopy;  Laterality: N/A;   ESOPHAGOGASTRODUODENOSCOPY  05/02/2011   Procedure: ESOPHAGOGASTRODUODENOSCOPY (EGD);  Surgeon: Lynwood LITTIE Celestia Mickey., MD;  Location: [facility];  Service: Endoscopy;  Laterality: N/A;   ESOPHAGOGASTRODUODENOSCOPY N/A 09/01/2014   Procedure: ESOPHAGOGASTRODUODENOSCOPY (EGD);  Surgeon: Lynwood Celestia, MD;  Location: [facility];  Service: Endoscopy;  Laterality: N/A;   IR CV LINE INJECTION  09/13/2023   IR IMAGING GUIDED PORT INSERTION  07/06/2023   KNEE SURGERY     LAPAROSCOPY     x 4   LESION REMOVAL N/A 01/18/2022   Procedure: EXCISION OF SEBACEOUS CYSTS CHEST AND NECK;  Surgeon: Vanderbilt Ned, MD;  Location: [facility];  Service: General;  Laterality: N/A;   PH IMPEDANCE STUDY N/A 11/04/2015   Procedure: PH IMPEDANCE STUDY;  Surgeon: Lynwood Bohr, MD;  Location: [facility];  Service: Endoscopy;  Laterality: N/A;   VAGINAL HYSTERECTOMY  2010   TVH--ovaries remain   Patient Active Problem List   Diagnosis Date Noted   Sjogren syndrome with keratoconjunctivitis (HCC) 12/09/2022   Other hemochromatosis 06/10/2021   Elevated LFTs 04/04/2021   Colitis 04/04/2021   Bacterial overgrowth syndrome 05/21/2020   Obesity 05/21/2020   History of endometriosis 05/21/2020   Constipation due to outlet dysfunction 02/05/2020   Gastroesophageal reflux disease 02/05/2020   Obstructive sleep apnea treated with  continuous positive airway pressure (CPAP) 06/16/2017   Perennial allergic rhinitis with a nonallergic component 01/31/2017   Asthmatic bronchitis 01/31/2017   GI symptoms 01/31/2017   Family history of colon cancer 01/27/2015   Chest pain 12/13/2012   Dyspnea 12/13/2012   DVT of lower extremity (deep venous thrombosis) (HCC) 01/03/1990    PCP: Elliot Charm, MD  REFERRING PROVIDER: Cleotilde Ronal RAMAN, MD   REFERRING DIAG: M62.89 (ICD-10-CM) - Pelvic floor dysfunction  THERAPY DIAG:  Muscle weakness (generalized)  Other muscle spasm  Other low back pain  Pelvic pain  Unspecified lack of coordination  Rationale for Evaluation and Treatment: Rehabilitation  ONSET DATE: chronic  SUBJECTIVE:                                                                                                                                                                                           SUBJECTIVE STATEMENT:  Pt states that she has gotten results from CT scan of abdomen and has 3 hernias: hiatal, umbilical, and midline upper abdominal. She was able to get 3 walks in last week and is working on better core engagement.   PAIN: 09/25/23 Are you having pain? Yes NPRS scale: 4/10 Pain location: Lower abdominal pain, Lt sided pain, low back pain  Pain type: turning, knotted, pressure Pain description: intermittent and constant   Aggravating factors: constipation or frequent bowel movements/constantly having bowel movements  Relieving factors: exercises (bowel massage, happy baby, spinal twists, walking)  PRECAUTIONS: None  WEIGHT BEARING RESTRICTIONS: No  FALLS:  Has patient fallen in last 6 months? No  LIVING ENVIRONMENT: Lives with: lives with their spouse Lives in: House/apartment  OCCUPATION: nurse, in admin  PLOF: Independent  PATIENT GOALS: decrease pain and have more normal bowel movements  PERTINENT HISTORY:  Vaginal hysterectomy, DVT LE, endometriosis,  interstitial cystitis, exploratory laps for endometriosis, c-section, cholecystectomy, RA diagnosed 06/2023 Sexual abuse: No  BOWEL MOVEMENT: Pain with bowel movement: Yes Type of bowel movement:Type (Bristol Stool Scale)  1-7 (sometimes full range in the same bowel movement), Frequency sometimes many times a day, sometimes up to 4 days in between, and Strain Yes Fully empty rectum: No Leakage: No Pads: No Fiber supplement: No - has attempted metamucil and it made constipation worse  URINATION: Pain with urination: Yes Fully empty bladder: No Stream: varies  Urgency: Yes: all the time Frequency: multiple times an hours  Leakage: Urge to void, Walking to the bathroom, Coughing, Sneezing, Laughing, and Exercise Pads: Yes: daily  INTERCOURSE: Pain with intercourse: Initial Penetration and During Penetration Ability to have vaginal penetration:  Yes: with pain Climax: non painful WNL   PREGNANCY: Vaginal deliveries 1 Tearing Yes: 3rd degree tear C-section deliveries 1 Currently pregnant No  PROLAPSE: Periodically will feel vaginal bulging, heaviness in lower abdomen   OBJECTIVE:  05/04/23:               Internal Pelvic Floor: mild tenderness in bil levator ani, but no increase in tension; with internal rectal exam, she was demonstrating paradoxical contraction when trying to eccentrically lengthen with exhale and had very hard time correcting  Patient confirms identification and approves PT to assess internal pelvic floor and treatment Yes  PELVIC MMT:   MMT eval  Vaginal 2/5, 3 second hold, 3 repeat contractions  Internal Anal Sphincter 1/5  External Anal Sphincter 2/5  Puborectalis 1/5  Diastasis Recti   (Blank rows = not tested)        TONE: WNL   PROLAPSE: Grade 2 anterior vaginal wall laxity  04/25/23: Curl-up test: abdominal distortion Transversus abdominus: week, difficulty getting active contraction (not been working on  strengthening)    LUMBARAROM/PROM:  A/PROM A/PROM  Eval (% available) 11/09/22 (% available) 04/25/23 (% available)  Flexion 50 90 90  Extension 25, pain anteriorly  25, pain in low back 50  Right lateral flexion 50, pain anteriorly  75, pain on right 50, pain on Rt  Left lateral flexion 50, pain anteriorly  75, pain on right 75   (Blank rows = not tested)  11/09/22: No internal rectal or vaginal pelvic floor exam performed due to high level of skin irritation   Weak transversus abdominus contraction  Abdominal restriction and tenderness in bil lower quadrant  Unable to perform pelvic tilt with appropriate coordination  LUMBARAROM/PROM:  A/PROM A/PROM  Eval (% available) 11/09/22 (% available)  Flexion 50 90  Extension 25, pain anteriorly  25, pain in low back  Right lateral flexion 50, pain anteriorly  75, pain on right  Left lateral flexion 50, pain anteriorly  75, pain on right   (Blank rows = not tested) 07/21/22: standing prolapse assessment demonstrated grade 2 anterior vaginal wall laxity   06/08/22:               External Perineal Exam: WNL                              Internal Pelvic Floor: burning reported with palpation of superficial muscles; deep aching/pressure with palpation of Lt levator ani   Patient confirms identification and approves PT to assess internal pelvic floor and treatment Yes  PELVIC MMT:   MMT eval  Vaginal 3/5  Internal Anal Sphincter 1/5  External Anal Sphincter 2/5  Puborectalis 1/5  Diastasis Recti   (Blank rows = not tested)        TONE: High in Lt levator ani   PROLAPSE: Not able to tell  this session due to dyssynergic pelvic floor contraction     06/01/22: COGNITION: Overall cognitive status: Within functional limits for tasks assessed     SENSATION: Light touch: Appears intact Proprioception: Appears intact  GAIT: Comments: decreased hip extension, forward flexed trunk  POSTURE: rounded shoulders, forward head,  decreased lumbar lordosis, increased thoracic kyphosis, posterior pelvic tilt, and flexed trunk    LUMBARAROM/PROM:  A/PROM A/PROM  Eval (% available)  Flexion 50  Extension 25, pain anteriorly   Right lateral flexion 50, pain anteriorly   Left lateral flexion 50, pain anteriorly    (Blank rows = not tested)   PALPATION:   General  Significant abdominal restriction and tenderness; decreased rib mobility with mobilization and breathing    TODAY'S TREATMENT:                                                                                                                              DATE:  09/25/23 Manual: ILU bowel mobilization  Abdominal scar tissue mobilization Ileocecal valve mobilization External rectum mobilization Myofascial release to abdomen with push/pull alternating forces horizontally  Diaphragm release All techniques performed in supine  Neuromuscular re-education: Supine resisted march + red band 2 x 10 Supine resisted hip abduction + red band 2 x 10 Supine resisted unilateral bent knee fall out + red band 10x bil Supine head lifts + bil UE flexion to 60 degrees + 3 lbs 10x Supine full shoulder flexion + 3lbs 10x Exercises: Lower trunk rotation 2 x 10  09/20/23 Manual: ILU bowel mobilization  Abdominal scar tissue mobilization Ileocecal valve mobilization External rectum mobilization Myofascial release to abdomen with push/pull alternating forces horizontally  Diaphragm release All techniques performed in supine   09/13/23 Manual: ILU bowel mobilization  Abdominal scar tissue mobilization Ileocecal valve mobilization External rectum mobilization Myofascial release to abdomen with push/pull alternating forces horizontally  Diaphragm release All techniques performed in supine  Exercises: Lateral lunge stretch 5 x 10 seconds bil  Ball up wall 10x  Therapeutic activities: Heel raises 2 x 10 3 way kick kick, 10x each, bil    PATIENT EDUCATION:   Education details: See above Person educated: Patient Education method: Explanation, Demonstration, Tactile cues, Verbal cues, and Handouts Education comprehension: verbalized understanding  HOME EXERCISE PROGRAM: [id number]  ASSESSMENT:  CLINICAL IMPRESSION: Pt overall doing well. She has continued working on being active. Diagnosis of two midline hernias makes sense as to the discomfort and pressure/bulging we have palpated in abdomen. It also makes sense as far as bowel issues. Core strengthening work that she has already been working on will continue to be valuable for hernia management and using to decrease impact of constipation on hernias progressing. Good tolerance to She will continue to benefit from skilled PT intervention in order to decrease pain, improve bowel movements, and decrease urinary urgency/incontinence.     OBJECTIVE IMPAIRMENTS: decreased activity tolerance, decreased coordination, decreased endurance, decreased mobility, decreased strength, increased fascial restrictions, increased  muscle spasms, impaired tone, postural dysfunction, and pain.   ACTIVITY LIMITATIONS: lifting, bending, continence, and locomotion level  PARTICIPATION LIMITATIONS: interpersonal relationship, community activity, and occupation  PERSONAL FACTORS: 1 comorbidity: medical history are also affecting patient's functional outcome.   REHAB POTENTIAL: Good  CLINICAL DECISION MAKING: Stable/uncomplicated  EVALUATION COMPLEXITY: Low   GOALS: Goals reviewed with patient? Yes  SHORT TERM GOALS: Updated 08/01/23  Pt will be independent with HEP.   Baseline: Goal status: MET 07/12/22  2.  Pt will be independent with diaphragmatic breathing and down training activities in order to improve pelvic floor relaxation.  Baseline:  Goal status: MET 07/12/22  3.  Pt will be independent with the knack, urge suppression technique, and double voiding in order to improve bladder habits and decrease  urinary incontinence.   Baseline:  Goal status: MET 09/22/22  4.  Pt will be independent with use of squatty potty, relaxed toileting mechanics, and improved bowel movement techniques in order to increase ease of bowel movements and complete evacuation.   Baseline:  Goal status: MET 11/09/22  5.  Pt will be able to correctly perform diaphragmatic breathing and appropriate pressure management in order to prevent worsening vaginal wall laxity and improve pelvic floor A/ROM.   Baseline:  Goal status: MET 01/18/23  LONG TERM GOALS: Updated 08/22/23  Pt will be independent with advanced HEP.   Baseline:  Goal status: IN PROGRESS 08/22/23  2.  Pt will demonstrate normal pelvic floor muscle tone and A/ROM, able to achieve 4/5 strength with contractions and 10 sec endurance, in order to provide appropriate lumbopelvic support in functional activities.   Baseline: not assessed today per patient request  Goal status: IN PROGRESS 08/22/23  3.  Pt will increase all impaired lumbar A/ROM by 25% without pain.  Baseline: Pt seen a little bit of loss in some and improvement in extension Goal status:  IN PROGRESS 08/22/23  4.  Pt will report pain no higher than 3/10 with any activity. Baseline: pain has gotten up to 5/10, but currently at a 3/10 Goal status:  IN PROGRESS 08/22/23  5.  Pt will report 0/10 pain with vaginal penetration in order to improve intimate relationship with partner.    Baseline: no recent intercourse attempts  Goal status:  IN PROGRESS 08/22/23  6.  Pt will be able to go 2-3 hours in between voids without urgency or incontinence in order to improve QOL and perform all functional activities with less difficulty.   Baseline:  Goal status: MET 02/21/23  7.  Pt will report no leaks with laughing, coughing, sneezing in order to improve comfort with interpersonal relationships and community activities.   Baseline: Pt states that she feels 30-40% better Goal status:  IN PROGRESS  08/22/23  8.  Pt will have consistent bowel movements 4-5x/week without straining or pain.  Baseline:  Goal status:  MET 02/21/23  PLAN:  PT FREQUENCY: 1-2x/week  PT DURATION: 6 months  PLANNED INTERVENTIONS: Therapeutic exercises, Therapeutic activity, Neuromuscular re-education, Balance training, Gait training, Patient/Family education, Self Care, Joint mobilization, Dry Needling, Biofeedback, and Manual therapy  PLAN FOR NEXT SESSION: strengthening program - reinforce importance and go over some exercises each session; internal pelvic floor muscle exam  Josette Mares, PT, DPT09/22/259:05 AM

## 2023-09-26 ENCOUNTER — Encounter: Payer: Self-pay | Admitting: Occupational Therapy

## 2023-09-26 ENCOUNTER — Ambulatory Visit: Admitting: Occupational Therapy

## 2023-09-26 DIAGNOSIS — I890 Lymphedema, not elsewhere classified: Secondary | ICD-10-CM

## 2023-09-26 DIAGNOSIS — M79641 Pain in right hand: Secondary | ICD-10-CM | POA: Diagnosis not present

## 2023-09-26 DIAGNOSIS — Z6833 Body mass index (BMI) 33.0-33.9, adult: Secondary | ICD-10-CM | POA: Diagnosis not present

## 2023-09-26 DIAGNOSIS — M3501 Sicca syndrome with keratoconjunctivitis: Secondary | ICD-10-CM | POA: Diagnosis not present

## 2023-09-26 DIAGNOSIS — M0600 Rheumatoid arthritis without rheumatoid factor, unspecified site: Secondary | ICD-10-CM | POA: Diagnosis not present

## 2023-09-26 DIAGNOSIS — M79642 Pain in left hand: Secondary | ICD-10-CM | POA: Diagnosis not present

## 2023-09-26 DIAGNOSIS — Z79899 Other long term (current) drug therapy: Secondary | ICD-10-CM | POA: Diagnosis not present

## 2023-09-26 DIAGNOSIS — E669 Obesity, unspecified: Secondary | ICD-10-CM | POA: Diagnosis not present

## 2023-09-26 NOTE — Therapy (Deleted)
 OUTPATIENT PHYSICAL THERAPY FEMALE PELVIC TREATMENT   Patient Name: Karen [name] MRN: [id number] DOB:1970/04/10, 53 y.o., female Today's Date: 09/26/2023  END OF SESSION:               Past Medical History:  Diagnosis Date   Adenomyosis    Anemia    Arthritis 2013   L knee gets steroid injections    Arthritis, rheumatic, acute or subacute    Asthmatic bronchitis 01/31/2017   Back pain    Chronic constipation    Diarrhea    DVT of lower extremity (deep venous thrombosis) (HCC)    Dysmenorrhea    Endometriosis    Esophageal stricture    G6PD deficiency    GERD (gastroesophageal reflux disease)    History of kidney stones    History of uterine fibroid    IBS (irritable bowel syndrome)    Interstitial cystitis    Lactose intolerance    Migraine    with aura   Other hemochromatosis 06/10/2021   PONV (postoperative nausea and vomiting)    Recurrent upper respiratory infection (URI)    Sebaceous cyst of breast, right lower inner quadrant 10/25/2012   Excised 11/21/12    Sleep apnea    Syncope, non cardiac    Past Surgical History:  Procedure Laterality Date   ARTHROSCOPIC HAGLUNDS REPAIR     BREAST CYST EXCISION Right 11/21/2012   Procedure: CYST EXCISION BREAST;  Surgeon: Sherlean JINNY Laughter, MD;  Location: [facility];  Service: General;  Laterality: Right;   BREAST CYST EXCISION Bilateral 01/18/2022   Procedure: EXCISION OF SEBACEOUS CYST BILATERAL BREAST;  Surgeon: Vanderbilt Ned, MD;  Location: [facility];  Service: General;  Laterality: Bilateral;   BREAST EXCISIONAL BIOPSY Right 11/2012   CESAREAN SECTION  04/19/1993   CHOLECYSTECTOMY     COLONOSCOPY  05/02/2011   Procedure: COLONOSCOPY;  Surgeon: Lynwood LITTIE Celestia Mickey., MD;  Location: [facility];  Service: Endoscopy;  Laterality: N/A;   colonoscopy  11/04/2014   DENTAL SURGERY  03/06/2018   2 surgeries ( 03/06/2018 and 04/06/2018)   to remove two separate benign tumors    ESOPHAGEAL MANOMETRY N/A 11/04/2015   Procedure: ESOPHAGEAL MANOMETRY (EM);  Surgeon: Lynwood Celestia, MD;  Location: [facility];  Service: Endoscopy;  Laterality: N/A;   ESOPHAGOGASTRODUODENOSCOPY  05/02/2011   Procedure: ESOPHAGOGASTRODUODENOSCOPY (EGD);  Surgeon: Lynwood LITTIE Celestia Mickey., MD;  Location: [facility];  Service: Endoscopy;  Laterality: N/A;   ESOPHAGOGASTRODUODENOSCOPY N/A 09/01/2014   Procedure: ESOPHAGOGASTRODUODENOSCOPY (EGD);  Surgeon: Lynwood Celestia, MD;  Location: [facility];  Service: Endoscopy;  Laterality: N/A;   IR CV LINE INJECTION  09/13/2023   IR IMAGING GUIDED PORT INSERTION  07/06/2023   KNEE SURGERY     LAPAROSCOPY     x 4   LESION REMOVAL N/A 01/18/2022   Procedure: EXCISION OF SEBACEOUS CYSTS CHEST AND NECK;  Surgeon: Vanderbilt Ned, MD;  Location: [facility];  Service: General;  Laterality: N/A;   PH IMPEDANCE STUDY N/A 11/04/2015   Procedure: PH IMPEDANCE STUDY;  Surgeon: Lynwood Celestia, MD;  Location: [facility];  Service: Endoscopy;  Laterality: N/A;   VAGINAL HYSTERECTOMY  2010   TVH--ovaries remain   Patient Active Problem List   Diagnosis Date Noted   Sjogren syndrome with keratoconjunctivitis 12/09/2022   Other hemochromatosis 06/10/2021   Elevated LFTs 04/04/2021   Colitis 04/04/2021   Bacterial overgrowth syndrome 05/21/2020   Obesity 05/21/2020   History of endometriosis 05/21/2020  Constipation due to outlet dysfunction 02/05/2020   Gastroesophageal reflux disease 02/05/2020   Obstructive sleep apnea treated with continuous positive airway pressure (CPAP) 06/16/2017   Perennial allergic rhinitis with a nonallergic component 01/31/2017   Asthmatic bronchitis 01/31/2017   GI symptoms 01/31/2017   Family history of colon cancer 01/27/2015   Chest pain 12/13/2012   Dyspnea 12/13/2012   DVT of lower extremity (deep venous thrombosis) (HCC) 01/03/1990    PCP: Elliot Charm, MD  REFERRING PROVIDER: Cleotilde Ronal RAMAN,  MD   REFERRING DIAG: M62.89 (ICD-10-CM) - Pelvic floor dysfunction  THERAPY DIAG:  Lymphedema, not elsewhere classified  Rationale for Evaluation and Treatment: Rehabilitation  ONSET DATE: chronic  SUBJECTIVE:                                                                                                                                                                                           SUBJECTIVE STATEMENT:  Pt states that she has gotten results from CT scan of abdomen and has 3 hernias: hiatal, umbilical, and midline upper abdominal. She was able to get 3 walks in last week and is working on better core engagement.   PAIN: 09/25/23 Are you having pain? Yes NPRS scale: 4/10 Pain location: Lower abdominal pain, Lt sided pain, low back pain  Pain type: turning, knotted, pressure Pain description: intermittent and constant   Aggravating factors: constipation or frequent bowel movements/constantly having bowel movements  Relieving factors: exercises (bowel massage, happy baby, spinal twists, walking)  PRECAUTIONS: None  WEIGHT BEARING RESTRICTIONS: No  FALLS:  Has patient fallen in last 6 months? No  LIVING ENVIRONMENT: Lives with: lives with their spouse Lives in: House/apartment  OCCUPATION: nurse, in admin  PLOF: Independent  PATIENT GOALS: decrease pain and have more normal bowel movements  PERTINENT HISTORY:  Vaginal hysterectomy, DVT LE, endometriosis, interstitial cystitis, exploratory laps for endometriosis, c-section, cholecystectomy, RA diagnosed 06/2023 Sexual abuse: No  BOWEL MOVEMENT: Pain with bowel movement: Yes Type of bowel movement:Type (Bristol Stool Scale) 1-7 (sometimes full range in the same bowel movement), Frequency sometimes many times a day, sometimes up to 4 days in between, and Strain Yes Fully empty rectum: No Leakage: No Pads: No Fiber supplement: No - has attempted metamucil and it made constipation worse  URINATION: Pain with  urination: Yes Fully empty bladder: No Stream: varies  Urgency: Yes: all the time Frequency: multiple times an hours  Leakage: Urge to void, Walking to the bathroom, Coughing, Sneezing, Laughing, and Exercise Pads: Yes: daily  INTERCOURSE: Pain with intercourse: Initial Penetration and During Penetration Ability to have vaginal penetration:  Yes: with pain Climax: non painful  WNL   PREGNANCY: Vaginal deliveries 1 Tearing Yes: 3rd degree tear C-section deliveries 1 Currently pregnant No  PROLAPSE: Periodically will feel vaginal bulging, heaviness in lower abdomen   OBJECTIVE:  05/04/23:               Internal Pelvic Floor: mild tenderness in bil levator ani, but no increase in tension; with internal rectal exam, she was demonstrating paradoxical contraction when trying to eccentrically lengthen with exhale and had very hard time correcting  Patient confirms identification and approves PT to assess internal pelvic floor and treatment Yes  PELVIC MMT:   MMT eval  Vaginal 2/5, 3 second hold, 3 repeat contractions  Internal Anal Sphincter 1/5  External Anal Sphincter 2/5  Puborectalis 1/5  Diastasis Recti   (Blank rows = not tested)        TONE: WNL   PROLAPSE: Grade 2 anterior vaginal wall laxity  04/25/23: Curl-up test: abdominal distortion Transversus abdominus: week, difficulty getting active contraction (not been working on strengthening)    LUMBARAROM/PROM:  A/PROM A/PROM  Eval (% available) 11/09/22 (% available) 04/25/23 (% available)  Flexion 50 90 90  Extension 25, pain anteriorly  25, pain in low back 50  Right lateral flexion 50, pain anteriorly  75, pain on right 50, pain on Rt  Left lateral flexion 50, pain anteriorly  75, pain on right 75   (Blank rows = not tested)  11/09/22: No internal rectal or vaginal pelvic floor exam performed due to high level of skin irritation   Weak transversus abdominus contraction  Abdominal restriction and tenderness  in bil lower quadrant  Unable to perform pelvic tilt with appropriate coordination  LUMBARAROM/PROM:  A/PROM A/PROM  Eval (% available) 11/09/22 (% available)  Flexion 50 90  Extension 25, pain anteriorly  25, pain in low back  Right lateral flexion 50, pain anteriorly  75, pain on right  Left lateral flexion 50, pain anteriorly  75, pain on right   (Blank rows = not tested) 07/21/22: standing prolapse assessment demonstrated grade 2 anterior vaginal wall laxity   06/08/22:               External Perineal Exam: WNL                              Internal Pelvic Floor: burning reported with palpation of superficial muscles; deep aching/pressure with palpation of Lt levator ani   Patient confirms identification and approves PT to assess internal pelvic floor and treatment Yes  PELVIC MMT:   MMT eval  Vaginal 3/5  Internal Anal Sphincter 1/5  External Anal Sphincter 2/5  Puborectalis 1/5  Diastasis Recti   (Blank rows = not tested)        TONE: High in Lt levator ani   PROLAPSE: Not able to tell this session due to dyssynergic pelvic floor contraction     06/01/22: COGNITION: Overall cognitive status: Within functional limits for tasks assessed     SENSATION: Light touch: Appears intact Proprioception: Appears intact  GAIT: Comments: decreased hip extension, forward flexed trunk  POSTURE: rounded shoulders, forward head, decreased lumbar lordosis, increased thoracic kyphosis, posterior pelvic tilt, and flexed trunk    LUMBARAROM/PROM:  A/PROM A/PROM  Eval (% available)  Flexion 50  Extension 25, pain anteriorly   Right lateral flexion 50, pain anteriorly   Left lateral flexion 50, pain anteriorly    (Blank rows = not tested)  PALPATION:   General  Significant abdominal restriction and tenderness; decreased rib mobility with mobilization and breathing    TODAY'S TREATMENT:                                                                                                                               DATE:  09/25/23 Manual: ILU bowel mobilization  Abdominal scar tissue mobilization Ileocecal valve mobilization External rectum mobilization Myofascial release to abdomen with push/pull alternating forces horizontally  Diaphragm release All techniques performed in supine  Neuromuscular re-education: Supine resisted march + red band 2 x 10 Supine resisted hip abduction + red band 2 x 10 Supine resisted unilateral bent knee fall out + red band 10x bil Supine head lifts + bil UE flexion to 60 degrees + 3 lbs 10x Supine full shoulder flexion + 3lbs 10x Exercises: Lower trunk rotation 2 x 10  09/20/23 Manual: ILU bowel mobilization  Abdominal scar tissue mobilization Ileocecal valve mobilization External rectum mobilization Myofascial release to abdomen with push/pull alternating forces horizontally  Diaphragm release All techniques performed in supine   09/13/23 Manual: ILU bowel mobilization  Abdominal scar tissue mobilization Ileocecal valve mobilization External rectum mobilization Myofascial release to abdomen with push/pull alternating forces horizontally  Diaphragm release All techniques performed in supine  Exercises: Lateral lunge stretch 5 x 10 seconds bil  Ball up wall 10x  Therapeutic activities: Heel raises 2 x 10 3 way kick kick, 10x each, bil    PATIENT EDUCATION:  Education details: See above Person educated: Patient Education method: Explanation, Demonstration, Tactile cues, Verbal cues, and Handouts Education comprehension: verbalized understanding  HOME EXERCISE PROGRAM: [id number]  ASSESSMENT:  CLINICAL IMPRESSION: Pt overall doing well. She has continued working on being active. Diagnosis of two midline hernias makes sense as to the discomfort and pressure/bulging we have palpated in abdomen. It also makes sense as far as bowel issues. Core strengthening work that she has already been working on will continue  to be valuable for hernia management and using to decrease impact of constipation on hernias progressing. Good tolerance to She will continue to benefit from skilled PT intervention in order to decrease pain, improve bowel movements, and decrease urinary urgency/incontinence.     OBJECTIVE IMPAIRMENTS: decreased activity tolerance, decreased coordination, decreased endurance, decreased mobility, decreased strength, increased fascial restrictions, increased muscle spasms, impaired tone, postural dysfunction, and pain.   ACTIVITY LIMITATIONS: lifting, bending, continence, and locomotion level  PARTICIPATION LIMITATIONS: interpersonal relationship, community activity, and occupation  PERSONAL FACTORS: 1 comorbidity: medical history are also affecting patient's functional outcome.   REHAB POTENTIAL: Good  CLINICAL DECISION MAKING: Stable/uncomplicated  EVALUATION COMPLEXITY: Low   GOALS: Goals reviewed with patient? Yes  SHORT TERM GOALS: Updated 08/01/23  Pt will be independent with HEP.   Baseline: Goal status: MET 07/12/22  2.  Pt will be independent with diaphragmatic breathing and down training activities in order to improve pelvic floor relaxation.  Baseline:  Goal  status: MET 07/12/22  3.  Pt will be independent with the knack, urge suppression technique, and double voiding in order to improve bladder habits and decrease urinary incontinence.   Baseline:  Goal status: MET 09/22/22  4.  Pt will be independent with use of squatty potty, relaxed toileting mechanics, and improved bowel movement techniques in order to increase ease of bowel movements and complete evacuation.   Baseline:  Goal status: MET 11/09/22  5.  Pt will be able to correctly perform diaphragmatic breathing and appropriate pressure management in order to prevent worsening vaginal wall laxity and improve pelvic floor A/ROM.   Baseline:  Goal status: MET 01/18/23  LONG TERM GOALS: Updated 08/22/23  Pt will be  independent with advanced HEP.   Baseline:  Goal status: IN PROGRESS 08/22/23  2.  Pt will demonstrate normal pelvic floor muscle tone and A/ROM, able to achieve 4/5 strength with contractions and 10 sec endurance, in order to provide appropriate lumbopelvic support in functional activities.   Baseline: not assessed today per patient request  Goal status: IN PROGRESS 08/22/23  3.  Pt will increase all impaired lumbar A/ROM by 25% without pain.  Baseline: Pt seen a little bit of loss in some and improvement in extension Goal status:  IN PROGRESS 08/22/23  4.  Pt will report pain no higher than 3/10 with any activity. Baseline: pain has gotten up to 5/10, but currently at a 3/10 Goal status:  IN PROGRESS 08/22/23  5.  Pt will report 0/10 pain with vaginal penetration in order to improve intimate relationship with partner.    Baseline: no recent intercourse attempts  Goal status:  IN PROGRESS 08/22/23  6.  Pt will be able to go 2-3 hours in between voids without urgency or incontinence in order to improve QOL and perform all functional activities with less difficulty.   Baseline:  Goal status: MET 02/21/23  7.  Pt will report no leaks with laughing, coughing, sneezing in order to improve comfort with interpersonal relationships and community activities.   Baseline: Pt states that she feels 30-40% better Goal status:  IN PROGRESS 08/22/23  8.  Pt will have consistent bowel movements 4-5x/week without straining or pain.  Baseline:  Goal status:  MET 02/21/23  PLAN:  PT FREQUENCY: 1-2x/week  PT DURATION: 6 months  PLANNED INTERVENTIONS: Therapeutic exercises, Therapeutic activity, Neuromuscular re-education, Balance training, Gait training, Patient/Family education, Self Care, Joint mobilization, Dry Needling, Biofeedback, and Manual therapy  PLAN FOR NEXT SESSION: strengthening program - reinforce importance and go over some exercises each session; internal pelvic floor muscle  exam  Josette Mares, PT, DPT09/23/259:23 AM

## 2023-09-26 NOTE — Therapy (Signed)
 OUTPATIENT OCCUPATIONAL THERAPY TREATMENT NOTE  BLE/ BLQ LYMPHEDEMA  Patient Name: Karen [name] MRN: [id number] DOB:27-Jan-1970, 53 y.o., female Today's Date: 09/26/2023  REPORTING PERIOD:  END OF SESSION:  OT End of Session - 09/26/23 0922     Visit Number 55    Number of Visits 72    Date for Recertification  11/16/23    OT Start Time 0918    OT Stop Time 1008    OT Time Calculation (min) 50 min    Activity Tolerance Patient tolerated treatment well;No increased pain                Past Medical History:  Diagnosis Date   Adenomyosis    Anemia    Arthritis 2013   L knee gets steroid injections    Arthritis, rheumatic, acute or subacute    Asthmatic bronchitis 01/31/2017   Back pain    Chronic constipation    Diarrhea    DVT of lower extremity (deep venous thrombosis) (HCC)    Dysmenorrhea    Endometriosis    Esophageal stricture    G6PD deficiency    GERD (gastroesophageal reflux disease)    History of kidney stones    History of uterine fibroid    IBS (irritable bowel syndrome)    Interstitial cystitis    Lactose intolerance    Migraine    with aura   Other hemochromatosis 06/10/2021   PONV (postoperative nausea and vomiting)    Recurrent upper respiratory infection (URI)    Sebaceous cyst of breast, right lower inner quadrant 10/25/2012   Excised 11/21/12    Sleep apnea    Syncope, non cardiac    Past Surgical History:  Procedure Laterality Date   ARTHROSCOPIC HAGLUNDS REPAIR     BREAST CYST EXCISION Right 11/21/2012   Procedure: CYST EXCISION BREAST;  Surgeon: Sherlean JINNY Laughter, MD;  Location: [facility];  Service: General;  Laterality: Right;   BREAST CYST EXCISION Bilateral 01/18/2022   Procedure: EXCISION OF SEBACEOUS CYST BILATERAL BREAST;  Surgeon: Vanderbilt Ned, MD;  Location: [facility];  Service: General;  Laterality: Bilateral;   BREAST EXCISIONAL BIOPSY Right 11/2012   CESAREAN SECTION  04/19/1993    CHOLECYSTECTOMY     COLONOSCOPY  05/02/2011   Procedure: COLONOSCOPY;  Surgeon: Lynwood LITTIE Celestia Mickey., MD;  Location: [facility];  Service: Endoscopy;  Laterality: N/A;   colonoscopy  11/04/2014   DENTAL SURGERY  03/06/2018   2 surgeries ( 03/06/2018 and 04/06/2018)   to remove two separate benign tumors   ESOPHAGEAL MANOMETRY N/A 11/04/2015   Procedure: ESOPHAGEAL MANOMETRY (EM);  Surgeon: Lynwood Celestia, MD;  Location: [facility];  Service: Endoscopy;  Laterality: N/A;   ESOPHAGOGASTRODUODENOSCOPY  05/02/2011   Procedure: ESOPHAGOGASTRODUODENOSCOPY (EGD);  Surgeon: Lynwood LITTIE Celestia Mickey., MD;  Location: [facility];  Service: Endoscopy;  Laterality: N/A;   ESOPHAGOGASTRODUODENOSCOPY N/A 09/01/2014   Procedure: ESOPHAGOGASTRODUODENOSCOPY (EGD);  Surgeon: Lynwood Celestia, MD;  Location: [facility];  Service: Endoscopy;  Laterality: N/A;   IR CV LINE INJECTION  09/13/2023   IR IMAGING GUIDED PORT INSERTION  07/06/2023   KNEE SURGERY     LAPAROSCOPY     x 4   LESION REMOVAL N/A 01/18/2022   Procedure: EXCISION OF SEBACEOUS CYSTS CHEST AND NECK;  Surgeon: Vanderbilt Ned, MD;  Location: [facility];  Service: General;  Laterality: N/A;   PH IMPEDANCE STUDY N/A 11/04/2015   Procedure: PH IMPEDANCE STUDY;  Surgeon: Lynwood Celestia, MD;  Location: [facility];  Service: Endoscopy;  Laterality: N/A;   VAGINAL HYSTERECTOMY  2010   TVH--ovaries remain   Patient Active Problem List   Diagnosis Date Noted   Sjogren syndrome with keratoconjunctivitis 12/09/2022   Other hemochromatosis 06/10/2021   Elevated LFTs 04/04/2021   Colitis 04/04/2021   Bacterial overgrowth syndrome 05/21/2020   Obesity 05/21/2020   History of endometriosis 05/21/2020   Constipation due to outlet dysfunction 02/05/2020   Gastroesophageal reflux disease 02/05/2020   Obstructive sleep apnea treated with continuous positive airway pressure (CPAP) 06/16/2017   Perennial allergic rhinitis with a nonallergic component  01/31/2017   Asthmatic bronchitis 01/31/2017   GI symptoms 01/31/2017   Family history of colon cancer 01/27/2015   Chest pain 12/13/2012   Dyspnea 12/13/2012   DVT of lower extremity (deep venous thrombosis) (HCC) 01/03/1990    PCP: Gaile New, MD  REFERRING PROVIDER: Valery Ripple, MD  REFERRING DIAG: I89.0  THERAPY DIAG:  Lymphedema, not elsewhere classified  Rationale for Evaluation and Treatment: Rehabilitation  ONSET DATE: ~2014; exacerbation in 2023  SUBJECTIVE:                                                                                                                                                                                          SUBJECTIVE STATEMENT:  Pt presents for OT to address lower quadrant, including deep abdominal lymphatics, and LLE lymphedema w/ associated pain. Pt has no new complaints.  Pt rate pain/ discomfort in the abdomen at 2/10. She reports ongoing constipation alternating with diarrhea. Pt reports abdomenal scan identified 3 hernias.  PERTINENT HISTORY: CVI, OSA (no CPAP), GERD, Esophageal stricture, IBS, Lactose intolerant, Diarrhea, Hx LE DVT, recent RA dx  PAIN:  Are you having pain? Yes: 2/10 Pain location: abdomen; digestive discomfort Pain description: abdominal fullness, tightness Aggravating factors: standing, walking,  Relieving factors: MLD, Miralax ,   PRECAUTIONS: Other: LYMPHEDEMA; Hx LLE DVT  WEIGHT BEARING RESTRICTIONS: No  FALLS:  Has patient fallen in last 6 months? No  LIVING ENVIRONMENT: Lives with: lives with spouse and daughter Lives in: House/apartment Stairs: Yes; Internal: 14 steps; yes and External: 4 steps; yes Has following equipment at home: None  OCCUPATION: [employer] operational officer, walking, desk work  LEISURE: family time  HAND DOMINANCE: right   PRIOR LEVEL OF FUNCTION: Independent  PATIENT GOALS: Feel better, be able to be more active without pain, wear preferred street shoes. NEW  08/03/23: Be able to manage digestive discomfort and gut inflammation symptoms without medications  OBJECTIVE: Note: Objective measures were completed at Evaluation unless otherwise noted.  COGNITION:  Overall cognitive status: Within functional limits for tasks assessed  POSTURE: WFL  LE ROM: WFL  LE MMT: WFL  LYMPHEDEMA ASSESSMENTS: non-Ca related  INFECTIONS: denies cellulitis and wound hx  BLE COMPARATIVE LIMB VOLUMETRICS Initial 11/15/22  LANDMARK RIGHT (dominant)  R LEG (A-D) 3028.9 ml  R THIGH (E-G) 4809.60 ml  R FULL LIMB (A-G) 7838.5 ml  Limb Volume differential (LVD)  %  Volume change since initial %  Volume change overall V  (Blank rows = not tested)  LANDMARK LEFT  (Rx)  L LEG (A-D) 3189.9 ml  L THIGH (E-G) 4959.8 ml  L FULL LIMB (A-G) 8149.7 ml  Limb Volume differential (LVD)  LEG LVD = 5.32%, L>R; THIGH LVD = 3.12%, L>R; And full LLE LVD = 3.97%, L>R.   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  Skin  Description Hyper-Keratosis Peau d' Orange Shiny Tight Fibrotic/ Indurated Fatty Doughy Spongy/ boggy       R>L x  x   Skin dry Flaky WNL Macerated   mildly      Color Redness Varicosities Blanching Hemosiderin Stain Mottled        x   Odor Malodorous Yeast Fungal infection  WNL      x   Temperature Warm Cool wnl     x    Pitting Edema   1+ 2+ 3+ 4+ Non-pitting         x   Girth Symmetrical Asymmetrical                   Distribution    L>R toes to groin    Stemmer Sign Positive Negative   +L -R   Lymphorrhea History Of:  Present Absent      x    Wounds History Of Present Absent Venous Arterial Pressure Sheer     x        Signs of Infection Redness Warmth Erythema Acute Swelling Drainage Borders                    Sensation Light Touch Deep pressure Hypersensitivity   In tact Impaired In tact Impaired Absent Impaired   x  x  x     Nails WNL   Fungus nail dystrophy   x     Hair Growth Symmetrical Asymmetrical   x    Skin Creases Base of toes  Ankles   Base of Fingers knees       Abdominal pannus Thigh Lobules  Face/neck   x           GAIT: Distance walked: >500' Assistive device utilized: None Level of assistance: Complete Independence Comments: Functional ambulation and transfers [facility]  LYMPHEDEMA LIFE IMPACT SCALE (LLIS): Initial 11/15/22  50%  FOTO functional outcome measure: Deferred. FOTO discontinued.  TODAY'S TREATMENT:  MLD to abdominal lymphatics Pt edu for LE self care  PATIENT EDUCATION:  Continued Pt/ CG edu for how function of cisterna chyli, part of the thoracic duct of deep abdominal lymphatics, assists with digestion by filtering many of the fats and proteins from the digestive system. Sub optimal lymphatic flow in this region may result in constipation, bloating, swelling, etc. MLD helps mobilize these protein and fats , and the rhythmic movement of manual lymphatic drainage stimulates digestive muscles and increase peristalsis. Provided printed resource for reference.  Topics include outcome of comparative limb volumetrics- starting limb volume differentials (LVDs), technology and gradient techniques used for short stretch, multilayer compression wrapping, simple self-MLD, therapeutic lymphatic pumping exercises, skin/nail care, LE precautions,. compression garment recommendations and specifications, wear and care schedule and compression garment donning  / doffing w assistive devices. Discussed progress towards all OT goals since commencing CDT. All questions answered to the Pt's satisfaction. Good return. Person educated: Patient  Education method: Explanation, Demonstration, and Handouts Education comprehension: verbalized understanding, returned demonstration, verbal cues required, and needs further education  HOME EXERCISE PROGRAM: BLE lymphatic pumping there ex using- 1 sets of 10 reps, each exercise in order-  1-2 x daily, bilaterally Simple self MLD 1 x daily Daily skin care to increase hydration, skin mobility and decrease infection risk- can be done during MLD Compression wraps 23/7 during intensive phase of CDT Compression garments/ devices during self-management phase of CDT  ASSESSMENT:  CLINICAL IMPRESSION: Resumed short neck sequence since port tenderness and R lateral neck swelling is resolved this visit. Pt also continues to have digestive discomfort associated with chronic constipation alternating with diarrhea.  Pt tolerated the abdominal sequence as established without increased pain. Pt continues to  benefit from MLD for enhancing gut motility and bowel function.  MLD is also known to facilitate the removal of inflammatory substances and toxins from the intestinal area, which may help reduce inflammation linked to digestive disorders. Cont  1 x weekly as per POC.  11/15/22 Initial Evaluation: Tyshawna Alarid is a 53 y.o. female presenting with very mild, stage II, LLE lymphedema 2/2 venous insufficiency and obesity. L Leg swelling and associated pain swelling fluctuates. It has progressed over time, and now no longer resolves with elevation over night. RLE swelling limits balance during functional ambulation. It exacerbates infection and falls risk. LLE/RLQ lymphedema interferes with functional performance in all occupational domains, including basic and instrumental ADLs, productive activities, leisure pursuits and social  participation. Pt will benefit from Occupational Therapy for Complete Decongestive Therapy (CDT) to restore function, to reduce physical and psychologic suffering associated with chronic, progressive lymphedema and associated pain, and to limit infection. CDT will include manual lymphatic drainage (MLD), skin care, therapeutic exercise and compression wraps and garment's devices. Without skilled OT for lymphedema care the condition will worsen and further functional decline is expected  Custom-made gradient compression garments and HOS devices are medically necessary because they are uniquely sized and shaped to fit the exact dimensions of the affected extremities, and to provide appropriate medical grade, graduated compression essential for optimally managing chronic, progressive lymphedema. Multiple custom compression garments are needed to ensure proper hygiene to limit infection risk. Custom compression garments should be replaced q 3-6 months When worn consistently for optimal lipo-lymphedema self-management over time. HOS devices, medically necessary to limit fibrosis buildup in tissue, should be replaced q 2 years and PRN when worn out.      OBJECTIVE IMPAIRMENTS: decreased activity tolerance, decreased knowledge of condition, decreased knowledge of  use of DME, increased edema, impaired sensation, pain, and chronic, progressive leg swelling.   ACTIVITY LIMITATIONS: dependent sitting, extended standing and/ or walking, squatting, and lower body dressing, fitting preferred street shoes  PARTICIPATION LIMITATIONS: shopping, community activity, occupation, and yard work  PERSONAL FACTORS: 3+ comorbidities: Hx DVT,  OSA, CVI. Varicose veins are also affecting patient's functional outcome.   REHAB POTENTIAL: Good  EVALUATION COMPLEXITY: Moderate   GOALS: Goals reviewed with patient? Yes  SHORT TERM GOALS: Target date: 4th OT Rx visit  SHORT TERM GOALS: Target date: 4th OT Rx visit   Pt will  demonstrate understanding of lymphedema precautions and prevention strategies with modified independence using a printed reference to identify at least 5 precautions and discussing how s/he may implement them into daily life to reduce risk of progression with extra time. Baseline:Max A Goal status: GOAL MET   Pt will be able to apply multilayer, knee length, gradient, compression wraps to one leg at a time with modified assistance (extra time and assistive device/s) to decrease limb volume, to limit infection risk, and to limit lymphedema progression.  Baseline: Dependent Goal status: GOAL DISCHARGED. Pt Going to compression garments instead  LONG TERM GOALS: Target date: 02/08/23  Given this patient's Intake score of 67%/100% on the functional outcomes FOTO tool, patient will experience an increase in function of 5 points to improve basic and instrumental ADLs performance, including lymphedema self-care.  Baseline: Max A Goal status:GOAL DISCHARGED.  FOTO TOOL DISCONTINUED AT CLINIC   Given this patient's Intake score of 50% on the Lymphedema Life Impact Scale (LLIS), patient will experience a reduction of at least 5 points in her perceived level of functional impairment resulting from lymphedema to improve functional performance and quality of life (QOL). Baseline: 50% Goal status: PROGRESSING  Pt will achieve at least a 10% volume reduction in B legs to return limb to typical size and shape, to limit infection risk and LE progression, to decrease pain, to improve function. Baseline: Dependent Goal status: PROGRESSING  4.  Pt will obtain proper compression garments/devices and achieve modified independence (extra time + assistive devices) with donning/doffing to optimize limb volume reductions and limit LE  progression over time. Baseline:  Goal status:GOAL MET  5.  During Intensive phase CDT , with modified independence, Pt will achieve at least 85% compliance with all lymphedema self-care  home program components, including daily skin care, compression wraps and /or garments, simple self MLD and lymphatic pumping therex to habituate LE self care protocol  into ADLs for optimal LE self-management over time. Baseline: Dependent Goal status:GOAL MET   PLAN:  OT FREQUENCY: 1 /week  OT DURATION: 12 weeks and PRN  PLANNED INTERVENTIONS: 97110-Therapeutic exercises, 97530- Therapeutic activity, 97535- Self Care, [id number]- Manual therapy, Patient/Family education, Manual lymph drainage, Compression bandaging, and fit with appropriate compression garments  PLAN FOR NEXT SESSION:  Cont MLD as established Pt and family edu for LE self-care  Zebedee Dec, MS, OTR/L, CLT-LANA 09/26/23 10:09 AM

## 2023-09-27 ENCOUNTER — Ambulatory Visit: Admitting: Physical Therapy

## 2023-09-27 DIAGNOSIS — M6281 Muscle weakness (generalized): Secondary | ICD-10-CM

## 2023-09-27 DIAGNOSIS — S46812A Strain of other muscles, fascia and tendons at shoulder and upper arm level, left arm, initial encounter: Secondary | ICD-10-CM | POA: Diagnosis not present

## 2023-09-27 DIAGNOSIS — M542 Cervicalgia: Secondary | ICD-10-CM

## 2023-09-27 DIAGNOSIS — M62838 Other muscle spasm: Secondary | ICD-10-CM | POA: Diagnosis not present

## 2023-09-27 NOTE — Therapy (Signed)
 OUTPATIENT PHYSICAL THERAPY CERVICAL TREATMENT   Patient Name: Karen [name] MRN: [id number] DOB:06-08-1970, 53 y.o., female Today's Date: 09/27/2023  END OF SESSION:  PT End of Session - 09/27/23 0852     Visit Number 10   ortho #8   Date for Recertification  10/03/23    Authorization Type [name]    PT Start Time [phone]    PT Stop Time 0931    PT Time Calculation (min) 38 min    Activity Tolerance Patient tolerated treatment well                Past Medical History:  Diagnosis Date   Adenomyosis    Anemia    Arthritis 2013   L knee gets steroid injections    Arthritis, rheumatic, acute or subacute    Asthmatic bronchitis 01/31/2017   Back pain    Chronic constipation    Diarrhea    DVT of lower extremity (deep venous thrombosis) (HCC)    Dysmenorrhea    Endometriosis    Esophageal stricture    G6PD deficiency    GERD (gastroesophageal reflux disease)    History of kidney stones    History of uterine fibroid    IBS (irritable bowel syndrome)    Interstitial cystitis    Lactose intolerance    Migraine    with aura   Other hemochromatosis 06/10/2021   PONV (postoperative nausea and vomiting)    Recurrent upper respiratory infection (URI)    Sebaceous cyst of breast, right lower inner quadrant 10/25/2012   Excised 11/21/12    Sleep apnea    Syncope, non cardiac    Past Surgical History:  Procedure Laterality Date   ARTHROSCOPIC HAGLUNDS REPAIR     BREAST CYST EXCISION Right 11/21/2012   Procedure: CYST EXCISION BREAST;  Surgeon: Sherlean JINNY Laughter, MD;  Location: [facility];  Service: General;  Laterality: Right;   BREAST CYST EXCISION Bilateral 01/18/2022   Procedure: EXCISION OF SEBACEOUS CYST BILATERAL BREAST;  Surgeon: Vanderbilt Ned, MD;  Location: [facility];  Service: General;  Laterality: Bilateral;   BREAST EXCISIONAL BIOPSY Right 11/2012   CESAREAN SECTION  04/19/1993   CHOLECYSTECTOMY      COLONOSCOPY  05/02/2011   Procedure: COLONOSCOPY;  Surgeon: Lynwood LITTIE Celestia Mickey., MD;  Location: [facility];  Service: Endoscopy;  Laterality: N/A;   colonoscopy  11/04/2014   DENTAL SURGERY  03/06/2018   2 surgeries ( 03/06/2018 and 04/06/2018)   to remove two separate benign tumors   ESOPHAGEAL MANOMETRY N/A 11/04/2015   Procedure: ESOPHAGEAL MANOMETRY (EM);  Surgeon: Lynwood Celestia, MD;  Location: [facility];  Service: Endoscopy;  Laterality: N/A;   ESOPHAGOGASTRODUODENOSCOPY  05/02/2011   Procedure: ESOPHAGOGASTRODUODENOSCOPY (EGD);  Surgeon: Lynwood LITTIE Celestia Mickey., MD;  Location: [facility];  Service: Endoscopy;  Laterality: N/A;   ESOPHAGOGASTRODUODENOSCOPY N/A 09/01/2014   Procedure: ESOPHAGOGASTRODUODENOSCOPY (EGD);  Surgeon: Lynwood Celestia, MD;  Location: [facility];  Service: Endoscopy;  Laterality: N/A;   IR CV LINE INJECTION  09/13/2023   IR IMAGING GUIDED PORT INSERTION  07/06/2023   KNEE SURGERY     LAPAROSCOPY     x 4   LESION REMOVAL N/A 01/18/2022   Procedure: EXCISION OF SEBACEOUS CYSTS CHEST AND NECK;  Surgeon: Vanderbilt Ned, MD;  Location: [facility];  Service: General;  Laterality: N/A;   PH IMPEDANCE STUDY N/A 11/04/2015   Procedure: PH IMPEDANCE STUDY;  Surgeon: Lynwood Celestia, MD;  Location: [facility];  Service: Endoscopy;  Laterality: N/A;   VAGINAL HYSTERECTOMY  2010   TVH--ovaries remain   Patient Active Problem List   Diagnosis Date Noted   Sjogren syndrome with keratoconjunctivitis 12/09/2022   Other hemochromatosis 06/10/2021   Elevated LFTs 04/04/2021   Colitis 04/04/2021   Bacterial overgrowth syndrome 05/21/2020   Obesity 05/21/2020   History of endometriosis 05/21/2020   Constipation due to outlet dysfunction 02/05/2020   Gastroesophageal reflux disease 02/05/2020   Obstructive sleep apnea treated with continuous positive airway pressure (CPAP) 06/16/2017   Perennial allergic rhinitis with a nonallergic component 01/31/2017    Asthmatic bronchitis 01/31/2017   GI symptoms 01/31/2017   Family history of colon cancer 01/27/2015   Chest pain 12/13/2012   Dyspnea 12/13/2012   DVT of lower extremity (deep venous thrombosis) (HCC) 01/03/1990    PCP: Elliot Charm, MD   REFERRING PROVIDER: Huey Redell Fallow, PA-C  REFERRING DIAG: M54.2 (ICD-10-CM) - Cervicalgia  THERAPY DIAG:  Muscle weakness (generalized)  Cervicalgia  Strain of left trapezius muscle, initial encounter  Rationale for Evaluation and Treatment: Rehabilitation  ONSET DATE: July 2025  SUBJECTIVE:                                                                                                                                                                                                         SUBJECTIVE STATEMENT: Saw the neck specialist and felt there is narrowing and spurs.  MRI in 2 weeks.  He told me to avoid looking up.  Saw the rheumatologist and she said the medication would help for RA but not OA. She repots she has a blood clot in port. Went to ED after Labor Day.   Eval: Reports onset of L UT pain following port placement into R shoulder Hand dominance: Right  PERTINENT HISTORY:  07/21/2023 Assessment: - suspect left-sided cervical/ trapezius muscle spasm - reassured by absence of red flag signs/ symptoms at this time   Concurrent lymphedema PAIN: 09/27/23 Are you having pain? Yes: NPRS scale: 4/10 Pain location: L UT Pain description: ache, cramp Aggravating factors: activity Relieving factors: heat, medication  PRECAUTIONS: None  RED FLAGS: None     WEIGHT BEARING RESTRICTIONS: No  FALLS:  Has patient fallen in last 6 months? No  OCCUPATION: [employer] Employee  PLOF: Independent  PATIENT GOALS: To manage my neck pain  NEXT MD VISIT: TBD  OBJECTIVE:  Note: Objective measures were completed at Evaluation unless otherwise noted.  DIAGNOSTIC FINDINGS:  none  PATIENT SURVEYS:  NDI: 28/50  56% perceived disability  Minimum Detectable Change (  90% confidence): 5 points or 10% points   POSTURE: rounded shoulders and forward head  PALPATION: Global tenderness throughout B UT and posterior shoulder girdle   CERVICAL ROM:   Active ROM A/PROM (deg) eval  Flexion 75%P!  Extension 75%P!  Right lateral flexion 75%P!  Left lateral flexion 75%P!  Right rotation 75%P!  Left rotation 75%P!   (Blank rows = not tested)  UPPER EXTREMITY ROM:  Active ROM Right eval Left eval  Shoulder flexion 150 150  Shoulder extension    Shoulder abduction 150 150  Shoulder adduction    Shoulder extension    Shoulder internal rotation    Shoulder external rotation    Elbow flexion    Elbow extension    Wrist flexion    Wrist extension    Wrist ulnar deviation    Wrist radial deviation    Wrist pronation    Wrist supination     (Blank rows = not tested)  UPPER EXTREMITY MMT:  MMT Right eval Left eval  Shoulder flexion    Shoulder extension    Shoulder abduction    Shoulder adduction    Shoulder extension    Shoulder internal rotation    Shoulder external rotation    Middle trapezius    Lower trapezius    Elbow flexion    Elbow extension    Wrist flexion    Wrist extension    Wrist ulnar deviation    Wrist radial deviation    Wrist pronation    Wrist supination    Grip strength     (Blank rows = not tested)  CERVICAL SPECIAL TESTS:  Neck flexor muscle endurance test: Negative  FUNCTIONAL TESTS:  N/A  TREATMENT            09/27/2023 Discussed findings of her recent consult with the neck specialist and using pain as her guide on movement; also discussed the benefits of movement for nerve health Manual: seated soft tissue mobilization to right > left cervical paraspinals and suboccipitals; grade 2 cervical distraction 3x 10; avoided right upper trap soft tissue mob secondary to blood clot in right port Instruction in seated self traction with fist or towel  (Added to HEP- see below)  Standing median nerve floss 10x right/left (Added to HEP- see below) Standing scapular retraction 10x Standing rows with green band with handles (pt given for home use) 10x (Added to HEP- see below) Discussed EMOM (every minute on the minute) with walking for cardio 1 min, then 1 min of nerve floss, then retractions or band rows for 1 min, then rest 1 min for 2-3 rounds           09/19/2023 Manual: seated soft tissue mobilization to bilateral upper trap, levator scap, bilateral cervical paraspinals  Seated shoulder circles x 10 each direction Standing pelvic tilt x 4 (patient braced whole body) Hooklying pelvic tilt x 20 Hooklying bent knee fall out x 10 bilateral  Hooklying TA activation + march x 10 bilateral  Cervical Melt method with foam roll (flexion/extension and rotation) x 20 each direction Chin tucks against foam roll x 12 Seated reclined crunch x 8  Standing TA activation + march x 5 each    09/12/2023 Manual: seated soft tissue mobilization to bilateral upper trap, levator scap, bilateral cervical paraspinals  Upper trap stretch 2 x 20 sec bilateral  Scalene 2 x 20 sec bilateral  Cervical Melt method with foam roll (flexion/extension and rotation) x 20 each direction Chin tucks against foam  roll x 12 Supine scap squeezes x 12 Supine serratus punch x 12 bilateral  Hooklying PPT 2 x 10 Bent knee fall out + TA activation x 10 bilateral  Supine shoulder extension isometric 2 x 10 3 sec hold     09/07/2023 Manual: seated soft tissue mobilization to bilateral upper trap, levator scap, bilateral cervical paraspinals and bilateral subocciptals Shoulder circles x 10 each direction Chin tucks x 10 Seated scap squeeze x 10  Pool noodle between shoulder blades + shoulder flexion x 10 Cervical Melt method with foam roll (flexion/extension and rotation) x 20 each direction Cervical chin tuck isometric 2  x 10 3 sec hold Supine shoulder extension  isometric 2 x 10 3 sec hold                                                                                                         PATIENT EDUCATION:  Education details: Discussed eval findings, rehab rationale and POC and patient is in agreement  Person educated: Patient Education method: Explanation and Handouts Education comprehension: verbalized understanding and needs further education  HOME EXERCISE PROGRAM: Access Code: [email or web address] URL: [email or web address] Date: 09/27/2023 Prepared by: Glade Pesa  Exercises - Seated Shoulder Shrugs  - 2 x daily - 5 x weekly - 2 sets - 10 reps - Seated Scapular Retraction  - 2 x daily - 5 x weekly - 2 sets - 10 reps - Gentle Levator Scapulae Stretch  - 1 x daily - 7 x weekly - 1 sets - 2 reps - 30 seconds hold - Seated Cervical Sidebending Stretch  - 1 x daily - 7 x weekly - 1 sets - 2 reps - 30 seconds hold - Quadricep Stretch with Chair and Counter Support  - 1 x daily - 7 x weekly - 2 sets - 20-30s hold - Standing Hamstring Stretch with Step  - 1 x daily - 7 x weekly - 2 sets - 20-30s hold - Seated Hamstring Stretch  - 1 x daily - 7 x weekly - 2 sets - 20-30 hold - Seated Hip Flexor Stretch  - 1 x daily - 7 x weekly - 2 sets - 20-30 hold - Seated Scalene Stretch with Towel  - 1 x daily - 7 x weekly - 2 sets - 20 reps - Seated Modified Small Range Crunch in Chair  - 1 x daily - 7 x weekly - 1-2 sets - 10 reps - Standing Marching  - 1 x daily - 7 x weekly - 1 sets - 10 reps - Median Nerve Flossing - Tray  - 1 x daily - 7 x weekly - 1 sets - 10 reps - Seated Cervical Traction  - 1 x daily - 7 x weekly - 1 sets - 10 reps - Seated Neck and Traction  - 1 x daily - 7 x weekly - 1 sets - 10 reps - Standing Low Shoulder Row with Anchored Resistance  - 1 x daily - 7 x weekly - 1 sets - 10 reps ASSESSMENT:  CLINICAL IMPRESSION: The patient responds  well to cervical distraction techniques as well as soft tissue mobilization although  avoided the upper trap due to close proximity to her port with a known blood clot present.  She also responds well to median nerve flossing and her HEP was updated to include these.  With band modification of handles she is able to perform resisted rows without irritation to her hands/wrists.  Therapist monitoring response to all interventions and modifying treatment accordingly.       OBJECTIVE IMPAIRMENTS: decreased activity tolerance, decreased mobility, decreased ROM, increased fascial restrictions, increased muscle spasms, impaired UE functional use, postural dysfunction, and pain.   ACTIVITY LIMITATIONS: carrying, lifting, bending, bed mobility, and reach over head  PERSONAL FACTORS: Age, Fitness, Past/current experiences, and 1 comorbidity: lymphedema are also affecting patient's functional outcome.   REHAB POTENTIAL: Good  CLINICAL DECISION MAKING: Stable/uncomplicated  EVALUATION COMPLEXITY: Low   GOALS: Goals reviewed with patient? No    SHORT TERM GOALS=LONG TERM GOALS: Target date: 10/03/23  Patient to demonstrate independence in HEP  Baseline: [id number] Goal status: INITIAL  2.  Patient will acknowledge 4/10 pain at least once during episode of care   Baseline: 8/10 Goal status: INITIAL  3.  Patient will score at least 20/50 on NDI to signify clinically meaningful improvement in functional abilities.   Baseline: 28/50 Goal status: INITIAL  4.  160d AROM in B shoulder flexion/abduction Baseline: 150d Goal status: INITIAL  5.  75% AROM cervical spine with minimal pain Baseline: 75% with moderate pain Goal status: INITIAL   PLAN:  PT FREQUENCY: 1-2x/week  PT DURATION: 6 weeks  PLANNED INTERVENTIONS: 97110-Therapeutic exercises, 97530- Therapeutic activity, 97112- Neuromuscular re-education, 97535- Self Care, and [id number]- Manual therapy  PLAN FOR NEXT SESSION: do NDI, shoulder and neck ROM and check progress toward goals; try EMOM rounds as above;  two  session left until end of POC; cervical MRI in 2 weeks; postural strengthening; manual techniques as appropriate, aerobic tasks, ROM and flexibility activities  Glade Pesa, PT 09/27/23 11:03 AM Phone: [phone] Fax: [phone]

## 2023-10-02 ENCOUNTER — Ambulatory Visit

## 2023-10-02 DIAGNOSIS — M62838 Other muscle spasm: Secondary | ICD-10-CM

## 2023-10-02 DIAGNOSIS — R102 Pelvic and perineal pain: Secondary | ICD-10-CM

## 2023-10-02 DIAGNOSIS — M6281 Muscle weakness (generalized): Secondary | ICD-10-CM | POA: Diagnosis not present

## 2023-10-02 DIAGNOSIS — M5459 Other low back pain: Secondary | ICD-10-CM

## 2023-10-02 DIAGNOSIS — R279 Unspecified lack of coordination: Secondary | ICD-10-CM

## 2023-10-02 NOTE — Therapy (Signed)
 OUTPATIENT PHYSICAL THERAPY FEMALE PELVIC TREATMENT   Patient Name: Karen [name] MRN: [id number] DOB:08/05/1970, 53 y.o., female Today's Date: 10/02/2023  END OF SESSION:  PT End of Session - 10/02/23 0853     Visit Number 51    Date for Recertification  10/10/23    Authorization Type [name]    PT Start Time [phone]    PT Stop Time 0928    PT Time Calculation (min) 39 min    Activity Tolerance Patient tolerated treatment well    Behavior During Therapy [facility] for tasks assessed/performed                     Past Medical History:  Diagnosis Date   Adenomyosis    Anemia    Arthritis 2013   L knee gets steroid injections    Arthritis, rheumatic, acute or subacute    Asthmatic bronchitis 01/31/2017   Back pain    Chronic constipation    Diarrhea    DVT of lower extremity (deep venous thrombosis) (HCC)    Dysmenorrhea    Endometriosis    Esophageal stricture    G6PD deficiency    GERD (gastroesophageal reflux disease)    History of kidney stones    History of uterine fibroid    IBS (irritable bowel syndrome)    Interstitial cystitis    Lactose intolerance    Migraine    with aura   Other hemochromatosis 06/10/2021   PONV (postoperative nausea and vomiting)    Recurrent upper respiratory infection (URI)    Sebaceous cyst of breast, right lower inner quadrant 10/25/2012   Excised 11/21/12    Sleep apnea    Syncope, non cardiac    Past Surgical History:  Procedure Laterality Date   ARTHROSCOPIC HAGLUNDS REPAIR     BREAST CYST EXCISION Right 11/21/2012   Procedure: CYST EXCISION BREAST;  Surgeon: Sherlean JINNY Laughter, MD;  Location: [facility];  Service: General;  Laterality: Right;   BREAST CYST EXCISION Bilateral 01/18/2022   Procedure: EXCISION OF SEBACEOUS CYST BILATERAL BREAST;  Surgeon: Vanderbilt Ned, MD;  Location: [facility];  Service: General;  Laterality: Bilateral;   BREAST EXCISIONAL BIOPSY Right  11/2012   CESAREAN SECTION  04/19/1993   CHOLECYSTECTOMY     COLONOSCOPY  05/02/2011   Procedure: COLONOSCOPY;  Surgeon: Lynwood LITTIE Celestia Mickey., MD;  Location: [facility];  Service: Endoscopy;  Laterality: N/A;   colonoscopy  11/04/2014   DENTAL SURGERY  03/06/2018   2 surgeries ( 03/06/2018 and 04/06/2018)   to remove two separate benign tumors   ESOPHAGEAL MANOMETRY N/A 11/04/2015   Procedure: ESOPHAGEAL MANOMETRY (EM);  Surgeon: Lynwood Celestia, MD;  Location: [facility];  Service: Endoscopy;  Laterality: N/A;   ESOPHAGOGASTRODUODENOSCOPY  05/02/2011   Procedure: ESOPHAGOGASTRODUODENOSCOPY (EGD);  Surgeon: Lynwood LITTIE Celestia Mickey., MD;  Location: [facility];  Service: Endoscopy;  Laterality: N/A;   ESOPHAGOGASTRODUODENOSCOPY N/A 09/01/2014   Procedure: ESOPHAGOGASTRODUODENOSCOPY (EGD);  Surgeon: Lynwood Celestia, MD;  Location: [facility];  Service: Endoscopy;  Laterality: N/A;   IR CV LINE INJECTION  09/13/2023   IR IMAGING GUIDED PORT INSERTION  07/06/2023   KNEE SURGERY     LAPAROSCOPY     x 4   LESION REMOVAL N/A 01/18/2022   Procedure: EXCISION OF SEBACEOUS CYSTS CHEST AND NECK;  Surgeon: Vanderbilt Ned, MD;  Location:  SUR[facility]ERY [facility];  Service: General;  Laterality: N/A;   PH IMPEDANCE STUDY N/A 11/04/2015  Procedure: PH IMPEDANCE STUDY;  Surgeon: Lynwood Bohr, MD;  Location: [facility];  Service: Endoscopy;  Laterality: N/A;   VAGINAL HYSTERECTOMY  2010   TVH--ovaries remain   Patient Active Problem List   Diagnosis Date Noted   Sjogren syndrome with keratoconjunctivitis 12/09/2022   Other hemochromatosis 06/10/2021   Elevated LFTs 04/04/2021   Colitis 04/04/2021   Bacterial overgrowth syndrome 05/21/2020   Obesity 05/21/2020   History of endometriosis 05/21/2020   Constipation due to outlet dysfunction 02/05/2020   Gastroesophageal reflux disease 02/05/2020   Obstructive sleep apnea treated with continuous positive airway pressure (CPAP) 06/16/2017   Perennial  allergic rhinitis with a nonallergic component 01/31/2017   Asthmatic bronchitis 01/31/2017   GI symptoms 01/31/2017   Family history of colon cancer 01/27/2015   Chest pain 12/13/2012   Dyspnea 12/13/2012   DVT of lower extremity (deep venous thrombosis) (HCC) 01/03/1990    PCP: Elliot Charm, MD  REFERRING PROVIDER: Cleotilde Ronal RAMAN, MD   REFERRING DIAG: M62.89 (ICD-10-CM) - Pelvic floor dysfunction  THERAPY DIAG:  Muscle weakness (generalized)  Other muscle spasm  Other low back pain  Pelvic pain  Unspecified lack of coordination  Rationale for Evaluation and Treatment: Rehabilitation  ONSET DATE: chronic  SUBJECTIVE:                                                                                                                                                                                           SUBJECTIVE STATEMENT:  Pt doing well overall with working on being more active. She feels like bowel movements have been more productive. She had one day in which meals caused a lot of blotting and discomfort.   PAIN: 10/02/23 Are you having pain? Yes NPRS scale: 4/10 Pain location: Lower abdominal pain, Lt sided pain, low back pain  Pain type: turning, knotted, pressure Pain description: intermittent and constant   Aggravating factors: constipation or frequent bowel movements/constantly having bowel movements  Relieving factors: exercises (bowel massage, happy baby, spinal twists, walking)  PRECAUTIONS: None  WEIGHT BEARING RESTRICTIONS: No  FALLS:  Has patient fallen in last 6 months? No  LIVING ENVIRONMENT: Lives with: lives with their spouse Lives in: House/apartment  OCCUPATION: nurse, in admin  PLOF: Independent  PATIENT GOALS: decrease pain and have more normal bowel movements  PERTINENT HISTORY:  Vaginal hysterectomy, DVT LE, endometriosis, interstitial cystitis, exploratory laps for endometriosis, c-section, cholecystectomy, RA diagnosed  06/2023 Sexual abuse: No  BOWEL MOVEMENT: Pain with bowel movement: Yes Type of bowel movement:Type (Bristol Stool Scale) 1-7 (sometimes full range in the same bowel movement), Frequency sometimes many times a day, sometimes up to  4 days in between, and Strain Yes Fully empty rectum: No Leakage: No Pads: No Fiber supplement: No - has attempted metamucil and it made constipation worse  URINATION: Pain with urination: Yes Fully empty bladder: No Stream: varies  Urgency: Yes: all the time Frequency: multiple times an hours  Leakage: Urge to void, Walking to the bathroom, Coughing, Sneezing, Laughing, and Exercise Pads: Yes: daily  INTERCOURSE: Pain with intercourse: Initial Penetration and During Penetration Ability to have vaginal penetration:  Yes: with pain Climax: non painful WNL   PREGNANCY: Vaginal deliveries 1 Tearing Yes: 3rd degree tear C-section deliveries 1 Currently pregnant No  PROLAPSE: Periodically will feel vaginal bulging, heaviness in lower abdomen   OBJECTIVE:  05/04/23:               Internal Pelvic Floor: mild tenderness in bil levator ani, but no increase in tension; with internal rectal exam, she was demonstrating paradoxical contraction when trying to eccentrically lengthen with exhale and had very hard time correcting  Patient confirms identification and approves PT to assess internal pelvic floor and treatment Yes  PELVIC MMT:   MMT eval  Vaginal 2/5, 3 second hold, 3 repeat contractions  Internal Anal Sphincter 1/5  External Anal Sphincter 2/5  Puborectalis 1/5  Diastasis Recti   (Blank rows = not tested)        TONE: WNL   PROLAPSE: Grade 2 anterior vaginal wall laxity  04/25/23: Curl-up test: abdominal distortion Transversus abdominus: week, difficulty getting active contraction (not been working on strengthening)    LUMBARAROM/PROM:  A/PROM A/PROM  Eval (% available) 11/09/22 (% available) 04/25/23 (% available)  Flexion 50 90 90   Extension 25, pain anteriorly  25, pain in low back 50  Right lateral flexion 50, pain anteriorly  75, pain on right 50, pain on Rt  Left lateral flexion 50, pain anteriorly  75, pain on right 75   (Blank rows = not tested)  11/09/22: No internal rectal or vaginal pelvic floor exam performed due to high level of skin irritation   Weak transversus abdominus contraction  Abdominal restriction and tenderness in bil lower quadrant  Unable to perform pelvic tilt with appropriate coordination  LUMBARAROM/PROM:  A/PROM A/PROM  Eval (% available) 11/09/22 (% available)  Flexion 50 90  Extension 25, pain anteriorly  25, pain in low back  Right lateral flexion 50, pain anteriorly  75, pain on right  Left lateral flexion 50, pain anteriorly  75, pain on right   (Blank rows = not tested) 07/21/22: standing prolapse assessment demonstrated grade 2 anterior vaginal wall laxity   06/08/22:               External Perineal Exam: WNL                              Internal Pelvic Floor: burning reported with palpation of superficial muscles; deep aching/pressure with palpation of Lt levator ani   Patient confirms identification and approves PT to assess internal pelvic floor and treatment Yes  PELVIC MMT:   MMT eval  Vaginal 3/5  Internal Anal Sphincter 1/5  External Anal Sphincter 2/5  Puborectalis 1/5  Diastasis Recti   (Blank rows = not tested)        TONE: High in Lt levator ani   PROLAPSE: Not able to tell this session due to dyssynergic pelvic floor contraction     06/01/22: COGNITION: Overall cognitive status: Within  functional limits for tasks assessed     SENSATION: Light touch: Appears intact Proprioception: Appears intact  GAIT: Comments: decreased hip extension, forward flexed trunk  POSTURE: rounded shoulders, forward head, decreased lumbar lordosis, increased thoracic kyphosis, posterior pelvic tilt, and flexed trunk    LUMBARAROM/PROM:  A/PROM A/PROM  Eval (%  available)  Flexion 50  Extension 25, pain anteriorly   Right lateral flexion 50, pain anteriorly   Left lateral flexion 50, pain anteriorly    (Blank rows = not tested)   PALPATION:   General  Significant abdominal restriction and tenderness; decreased rib mobility with mobilization and breathing    TODAY'S TREATMENT:                                                                                                                              DATE:  10/02/23 Manual: ILU bowel mobilization  Abdominal scar tissue mobilization Ileocecal valve mobilization External rectum mobilization Myofascial release to abdomen with push/pull alternating forces horizontally  Diaphragm release All techniques performed in supine  Neuromuscular re-education: Supine bil shoulder abduction/extension + red band 2 x 10 Supine bent knee fall out + red band 10x bil Bridge with march 10x bil Modified dead bug 10x bil Exercises: Supine alternating shoulder flexion 2 x 10 Lower trunk rotation 2 x 10 Butterfly 10 breaths   09/25/23 Manual: ILU bowel mobilization  Abdominal scar tissue mobilization Ileocecal valve mobilization External rectum mobilization Myofascial release to abdomen with push/pull alternating forces horizontally  Diaphragm release All techniques performed in supine  Neuromuscular re-education: Supine resisted march + red band 2 x 10 Supine resisted hip abduction + red band 2 x 10 Supine resisted unilateral bent knee fall out + red band 10x bil Supine head lifts + bil UE flexion to 60 degrees + 3 lbs 10x Supine full shoulder flexion + 3lbs 10x Exercises: Lower trunk rotation 2 x 10  09/20/23 Manual: ILU bowel mobilization  Abdominal scar tissue mobilization Ileocecal valve mobilization External rectum mobilization Myofascial release to abdomen with push/pull alternating forces horizontally  Diaphragm release All techniques performed in supine     PATIENT EDUCATION:   Education details: See above Person educated: Patient Education method: Programmer, multimedia, Demonstration, Tactile cues, Verbal cues, and Handouts Education comprehension: verbalized understanding  HOME EXERCISE PROGRAM: [id number]  ASSESSMENT:  CLINICAL IMPRESSION: Other than one day in which patient was not feeling well after eating, having bloating and discomfort in abdomen, she has been doing well. Her bowel movements have been more productive. She was able to progress core strengthening with appropriate pressure management this session with cues to engage core and pelvic floor muscles. Good tolerance to She will continue to benefit from skilled PT intervention in order to decrease pain, improve bowel movements, and decrease urinary urgency/incontinence.     OBJECTIVE IMPAIRMENTS: decreased activity tolerance, decreased coordination, decreased endurance, decreased mobility, decreased strength, increased fascial restrictions, increased muscle spasms, impaired tone, postural dysfunction, and pain.  ACTIVITY LIMITATIONS: lifting, bending, continence, and locomotion level  PARTICIPATION LIMITATIONS: interpersonal relationship, community activity, and occupation  PERSONAL FACTORS: 1 comorbidity: medical history are also affecting patient's functional outcome.   REHAB POTENTIAL: Good  CLINICAL DECISION MAKING: Stable/uncomplicated  EVALUATION COMPLEXITY: Low   GOALS: Goals reviewed with patient? Yes  SHORT TERM GOALS: Updated 08/01/23  Pt will be independent with HEP.   Baseline: Goal status: MET 07/12/22  2.  Pt will be independent with diaphragmatic breathing and down training activities in order to improve pelvic floor relaxation.  Baseline:  Goal status: MET 07/12/22  3.  Pt will be independent with the knack, urge suppression technique, and double voiding in order to improve bladder habits and decrease urinary incontinence.   Baseline:  Goal status: MET 09/22/22  4.  Pt will be  independent with use of squatty potty, relaxed toileting mechanics, and improved bowel movement techniques in order to increase ease of bowel movements and complete evacuation.   Baseline:  Goal status: MET 11/09/22  5.  Pt will be able to correctly perform diaphragmatic breathing and appropriate pressure management in order to prevent worsening vaginal wall laxity and improve pelvic floor A/ROM.   Baseline:  Goal status: MET 01/18/23  LONG TERM GOALS: Updated 10/02/23  Pt will be independent with advanced HEP.   Baseline:  Goal status: IN PROGRESS 08/22/23  2.  Pt will demonstrate normal pelvic floor muscle tone and A/ROM, able to achieve 4/5 strength with contractions and 10 sec endurance, in order to provide appropriate lumbopelvic support in functional activities.   Baseline: not assessed today per patient request  Goal status: IN PROGRESS 10/02/23  3.  Pt will increase all impaired lumbar A/ROM by 25% without pain.  Baseline: Pt seen a little bit of loss in some and improvement in extension Goal status:  IN PROGRESS 10/02/23  4.  Pt will report pain no higher than 3/10 with any activity. Baseline: pain has gotten up to 5/10, but currently at a 3/10 Goal status:  IN PROGRESS 10/02/23  5.  Pt will report 0/10 pain with vaginal penetration in order to improve intimate relationship with partner.    Baseline: no recent intercourse attempts  Goal status:  IN PROGRESS 10/02/23  6.  Pt will be able to go 2-3 hours in between voids without urgency or incontinence in order to improve QOL and perform all functional activities with less difficulty.   Baseline:  Goal status: MET 02/21/23  7.  Pt will report no leaks with laughing, coughing, sneezing in order to improve comfort with interpersonal relationships and community activities.   Baseline: Pt states that she feels 30-40% better Goal status:  IN PROGRESS 10/02/23  8.  Pt will have consistent bowel movements 4-5x/week without  straining or pain.  Baseline:  Goal status:  MET 02/21/23  PLAN:  PT FREQUENCY: 1-2x/week  PT DURATION: 6 months  PLANNED INTERVENTIONS: Therapeutic exercises, Therapeutic activity, Neuromuscular re-education, Balance training, Gait training, Patient/Family education, Self Care, Joint mobilization, Dry Needling, Biofeedback, and Manual therapy  PLAN FOR NEXT SESSION: strengthening program - reinforce importance and go over some exercises each session; internal pelvic floor muscle exam  Josette Mares, PT, DPT09/29/259:00 AM

## 2023-10-03 ENCOUNTER — Ambulatory Visit: Admitting: Physical Therapy

## 2023-10-03 ENCOUNTER — Ambulatory Visit (HOSPITAL_BASED_OUTPATIENT_CLINIC_OR_DEPARTMENT_OTHER): Admitting: Obstetrics & Gynecology

## 2023-10-03 DIAGNOSIS — M6281 Muscle weakness (generalized): Secondary | ICD-10-CM

## 2023-10-03 DIAGNOSIS — M62838 Other muscle spasm: Secondary | ICD-10-CM | POA: Diagnosis not present

## 2023-10-03 DIAGNOSIS — M542 Cervicalgia: Secondary | ICD-10-CM

## 2023-10-03 DIAGNOSIS — S46812A Strain of other muscles, fascia and tendons at shoulder and upper arm level, left arm, initial encounter: Secondary | ICD-10-CM | POA: Diagnosis not present

## 2023-10-03 NOTE — Therapy (Signed)
 OUTPATIENT PHYSICAL THERAPY CERVICAL TREATMENT/RECERTIFICATION   Patient Name: Karen [name] MRN: [id number] DOB:31-Jul-1970, 53 y.o., female Today's Date: 10/03/2023  END OF SESSION:  PT End of Session - 10/03/23 0852     Visit Number 10   ortho   Date for Recertification  12/26/23    Authorization Type [name]    PT Start Time [phone]    PT Stop Time 0931    PT Time Calculation (min) 38 min    Activity Tolerance Patient tolerated treatment well                Past Medical History:  Diagnosis Date   Adenomyosis    Anemia    Arthritis 2013   L knee gets steroid injections    Arthritis, rheumatic, acute or subacute    Asthmatic bronchitis 01/31/2017   Back pain    Chronic constipation    Diarrhea    DVT of lower extremity (deep venous thrombosis) (HCC)    Dysmenorrhea    Endometriosis    Esophageal stricture    G6PD deficiency    GERD (gastroesophageal reflux disease)    History of kidney stones    History of uterine fibroid    IBS (irritable bowel syndrome)    Interstitial cystitis    Lactose intolerance    Migraine    with aura   Other hemochromatosis 06/10/2021   PONV (postoperative nausea and vomiting)    Recurrent upper respiratory infection (URI)    Sebaceous cyst of breast, right lower inner quadrant 10/25/2012   Excised 11/21/12    Sleep apnea    Syncope, non cardiac    Past Surgical History:  Procedure Laterality Date   ARTHROSCOPIC HAGLUNDS REPAIR     BREAST CYST EXCISION Right 11/21/2012   Procedure: CYST EXCISION BREAST;  Surgeon: Sherlean JINNY Laughter, MD;  Location: [facility];  Service: General;  Laterality: Right;   BREAST CYST EXCISION Bilateral 01/18/2022   Procedure: EXCISION OF SEBACEOUS CYST BILATERAL BREAST;  Surgeon: Vanderbilt Ned, MD;  Location: [facility];  Service: General;  Laterality: Bilateral;   BREAST EXCISIONAL BIOPSY Right 11/2012   CESAREAN SECTION  04/19/1993   CHOLECYSTECTOMY      COLONOSCOPY  05/02/2011   Procedure: COLONOSCOPY;  Surgeon: Lynwood LITTIE Celestia Mickey., MD;  Location: [facility];  Service: Endoscopy;  Laterality: N/A;   colonoscopy  11/04/2014   DENTAL SURGERY  03/06/2018   2 surgeries ( 03/06/2018 and 04/06/2018)   to remove two separate benign tumors   ESOPHAGEAL MANOMETRY N/A 11/04/2015   Procedure: ESOPHAGEAL MANOMETRY (EM);  Surgeon: Lynwood Celestia, MD;  Location: [facility];  Service: Endoscopy;  Laterality: N/A;   ESOPHAGOGASTRODUODENOSCOPY  05/02/2011   Procedure: ESOPHAGOGASTRODUODENOSCOPY (EGD);  Surgeon: Lynwood LITTIE Celestia Mickey., MD;  Location: [facility];  Service: Endoscopy;  Laterality: N/A;   ESOPHAGOGASTRODUODENOSCOPY N/A 09/01/2014   Procedure: ESOPHAGOGASTRODUODENOSCOPY (EGD);  Surgeon: Lynwood Celestia, MD;  Location: [facility];  Service: Endoscopy;  Laterality: N/A;   IR CV LINE INJECTION  09/13/2023   IR IMAGING GUIDED PORT INSERTION  07/06/2023   KNEE SURGERY     LAPAROSCOPY     x 4   LESION REMOVAL N/A 01/18/2022   Procedure: EXCISION OF SEBACEOUS CYSTS CHEST AND NECK;  Surgeon: Vanderbilt Ned, MD;  Location: [facility];  Service: General;  Laterality: N/A;   PH IMPEDANCE STUDY N/A 11/04/2015   Procedure: PH IMPEDANCE STUDY;  Surgeon: Lynwood Celestia, MD;  Location: [facility];  Service: Endoscopy;  Laterality: N/A;   VAGINAL HYSTERECTOMY  2010   TVH--ovaries remain   Patient Active Problem List   Diagnosis Date Noted   Sjogren syndrome with keratoconjunctivitis 12/09/2022   Other hemochromatosis 06/10/2021   Elevated LFTs 04/04/2021   Colitis 04/04/2021   Bacterial overgrowth syndrome 05/21/2020   Obesity 05/21/2020   History of endometriosis 05/21/2020   Constipation due to outlet dysfunction 02/05/2020   Gastroesophageal reflux disease 02/05/2020   Obstructive sleep apnea treated with continuous positive airway pressure (CPAP) 06/16/2017   Perennial allergic rhinitis with a nonallergic component 01/31/2017    Asthmatic bronchitis 01/31/2017   GI symptoms 01/31/2017   Family history of colon cancer 01/27/2015   Chest pain 12/13/2012   Dyspnea 12/13/2012   DVT of lower extremity (deep venous thrombosis) (HCC) 01/03/1990    PCP: Elliot Charm, MD   REFERRING PROVIDER: Huey Redell Fallow, PA-C  REFERRING DIAG: M54.2 (ICD-10-CM) - Cervicalgia  THERAPY DIAG:  Muscle weakness (generalized)  Cervicalgia  Rationale for Evaluation and Treatment: Rehabilitation  ONSET DATE: July 2025  SUBJECTIVE:                                                                                                                                                                                                         SUBJECTIVE STATEMENT: Having MRI next week.  I don't know if it's the weather or not but my neck is painful. Pops sometimes when I'm in the shower.  Goal is work on the computer for a period of time   Saw the neck specialist and felt there is narrowing and spurs.  MRI in 2 weeks.  He told me to avoid looking up.  Saw the rheumatologist and she said the medication would help for RA but not OA. She repots she has a blood clot in port. Went to ED after Labor Day.   Eval: Reports onset of L UT pain following port placement into R shoulder Hand dominance: Right  PERTINENT HISTORY:  07/21/2023 Assessment: - suspect left-sided cervical/ trapezius muscle spasm - reassured by absence of red flag signs/ symptoms at this time   Concurrent lymphedema PAIN: 10/03/23 Are you having pain? Yes: NPRS scale: 6/10 Pain location: L UT Pain description: ache, cramp Aggravating factors: activity Relieving factors: heat, medication  PRECAUTIONS: None  RED FLAGS: None     WEIGHT BEARING RESTRICTIONS: No  FALLS:  Has patient fallen in last 6 months? No  OCCUPATION: [employer] Employee  PLOF: Independent  PATIENT GOALS: To manage my neck pain  NEXT MD VISIT: TBD  OBJECTIVE:  Note: Objective  measures were completed at Evaluation unless otherwise noted.  DIAGNOSTIC FINDINGS:  none  PATIENT SURVEYS:  NDI: 28/50 56% perceived disability  Minimum Detectable Change (90% confidence): 5 points or 10% points  10/03/23:  NDI  30%   The Patient-Specific Functional Scale  Initial:  I am going to ask you to identify up to 3 important activities that you are unable to do or are having difficulty with as a result of this problem.  Today are there any activities that you are unable to do or having difficulty with because of this?  (Patient shown scale and patient rated each activity)  Follow up: When you first came in you had difficulty performing these activities.  Today do you still have difficulty?  Patient-Specific activity scoring scheme (Point to one number):  0 1 2 3 4 5 6 7 8 9  10 Unable                                                                                                          Able to perform To perform                                                                                                    activity at the same Activity         Level as before                                                                                                                       Injury or problem  Activity       Computer work 1 hour                                     9/30: 5                       2.          Cooking  9/30: 5                                                                   3.         Sit and watch TV                                              9/30: 5                                                                         POSTURE: rounded shoulders and forward head  PALPATION: Global tenderness throughout B UT and posterior shoulder girdle   CERVICAL ROM:   Active ROM A/PROM (deg) eval 9/30  Flexion 75%P! 47  Extension 75%P! 45  Right lateral flexion 75%P! 36  Left lateral flexion  75%P! 29  Right rotation 75%P! 35  Left rotation 75%P! 29   (Blank rows = not tested)  UPPER EXTREMITY ROM:  Active ROM Right eval Left eval 9/30  Shoulder flexion 150 150 L 155  R 151  Shoulder extension     Shoulder abduction 150 150 L 167 R 161  Shoulder adduction     Shoulder extension     Shoulder internal rotation     Shoulder external rotation     Elbow flexion     Elbow extension     Wrist flexion     Wrist extension     Wrist ulnar deviation     Wrist radial deviation     Wrist pronation     Wrist supination      (Blank rows = not tested)  UPPER EXTREMITY MMT:  MMT Right eval Left eval 9/30  Shoulder flexion     Shoulder extension     Shoulder abduction     Shoulder adduction     Shoulder extension     Shoulder internal rotation     Shoulder external rotation     Middle trapezius   Bil 4-/5  Lower trapezius   Bil 4-/5  Elbow flexion     Elbow extension     Wrist flexion     Wrist extension     Wrist ulnar deviation     Wrist radial deviation     Wrist pronation     Wrist supination     Grip strength      (Blank rows = not tested)  CERVICAL SPECIAL TESTS:  Neck flexor muscle endurance test: Negative 9/30 decreased cervical extensor strength 4-/5  FUNCTIONAL TESTS:  N/A  TREATMENT           10/03/2023 NDI Cervical ROM Shoulder ROM PSFS for functional activities as above Prone head lift with bil arm extension 5x Prone head lift/arm T lift  5x Standing median nerve floss 10x right/left review with cues for full elbow extension Verbal review of Standing rows with green  band with handles (pt given for home use)   09/27/2023 Discussed findings of her recent consult with the neck specialist and using pain as her guide on movement; also discussed the benefits of movement for nerve health Manual: seated soft tissue mobilization to right > left cervical paraspinals and suboccipitals; grade 2 cervical distraction 3x 10; avoided right upper trap soft  tissue mob secondary to blood clot in right port Instruction in seated self traction with fist or towel (Added to HEP- see below)  Standing median nerve floss 10x right/left (Added to HEP- see below) Standing scapular retraction 10x Standing rows with green band with handles (pt given for home use) 10x (Added to HEP- see below) Discussed EMOM (every minute on the minute) with walking for cardio 1 min, then 1 min of nerve floss, then retractions or band rows for 1 min, then rest 1 min for 2-3 rounds           09/19/2023 Manual: seated soft tissue mobilization to bilateral upper trap, levator scap, bilateral cervical paraspinals  Seated shoulder circles x 10 each direction Standing pelvic tilt x 4 (patient braced whole body) Hooklying pelvic tilt x 20 Hooklying bent knee fall out x 10 bilateral  Hooklying TA activation + march x 10 bilateral  Cervical Melt method with foam roll (flexion/extension and rotation) x 20 each direction Chin tucks against foam roll x 12 Seated reclined crunch x 8  Standing TA activation + march x 5 each    09/12/2023 Manual: seated soft tissue mobilization to bilateral upper trap, levator scap, bilateral cervical paraspinals  Upper trap stretch 2 x 20 sec bilateral  Scalene 2 x 20 sec bilateral  Cervical Melt method with foam roll (flexion/extension and rotation) x 20 each direction Chin tucks against foam roll x 12 Supine scap squeezes x 12 Supine serratus punch x 12 bilateral  Hooklying PPT 2 x 10 Bent knee fall out + TA activation x 10 bilateral  Supine shoulder extension isometric 2 x 10 3 sec hold                                                                                                      PATIENT EDUCATION:  Education details: Discussed eval findings, rehab rationale and POC and patient is in agreement  Person educated: Patient Education method: Explanation and Handouts Education comprehension: verbalized understanding and needs further  education  HOME EXERCISE PROGRAM: Access Code: [email or web address] URL: [email or web address] Date: 09/27/2023 Prepared by: Glade Pesa  Exercises - Seated Shoulder Shrugs  - 2 x daily - 5 x weekly - 2 sets - 10 reps - Seated Scapular Retraction  - 2 x daily - 5 x weekly - 2 sets - 10 reps - Gentle Levator Scapulae Stretch  - 1 x daily - 7 x weekly - 1 sets - 2 reps - 30 seconds hold - Seated Cervical Sidebending Stretch  - 1 x daily - 7 x weekly - 1 sets - 2 reps - 30 seconds hold - Quadricep Stretch with Chair and Counter Support  - 1 x daily - 7  x weekly - 2 sets - 20-30s hold - Standing Hamstring Stretch with Step  - 1 x daily - 7 x weekly - 2 sets - 20-30s hold - Seated Hamstring Stretch  - 1 x daily - 7 x weekly - 2 sets - 20-30 hold - Seated Hip Flexor Stretch  - 1 x daily - 7 x weekly - 2 sets - 20-30 hold - Seated Scalene Stretch with Towel  - 1 x daily - 7 x weekly - 2 sets - 20 reps - Seated Modified Small Range Crunch in Chair  - 1 x daily - 7 x weekly - 1-2 sets - 10 reps - Standing Marching  - 1 x daily - 7 x weekly - 1 sets - 10 reps - Median Nerve Flossing - Tray  - 1 x daily - 7 x weekly - 1 sets - 10 reps - Seated Cervical Traction  - 1 x daily - 7 x weekly - 1 sets - 10 reps - Seated Neck and Traction  - 1 x daily - 7 x weekly - 1 sets - 10 reps - Standing Low Shoulder Row with Anchored Resistance  - 1 x daily - 7 x weekly - 1 sets - 10 reps ASSESSMENT:  CLINICAL IMPRESSION: The patient has a significant improvement in Neck Disability Index from 56% to 30%.  Her shoulder ROM has improved but still limited with elevation needed for reaching high shelves.  Cervical ROM with modest improvements but limited motion needed for driving (particularly rotation).  The patient would benefit from a continuation of skilled PT for a further progression of strengthening and functional mobility.  Will continue to update and promote independence in a HEP needed for a return to the  highest functional level possible with ADLs.        OBJECTIVE IMPAIRMENTS: decreased activity tolerance, decreased mobility, decreased ROM, increased fascial restrictions, increased muscle spasms, impaired UE functional use, postural dysfunction, and pain.   ACTIVITY LIMITATIONS: carrying, lifting, bending, bed mobility, and reach over head  PERSONAL FACTORS: Age, Fitness, Past/current experiences, and 1 comorbidity: lymphedema are also affecting patient's functional outcome.   REHAB POTENTIAL: Good  CLINICAL DECISION MAKING: Stable/uncomplicated  EVALUATION COMPLEXITY: Low   GOALS: Goals reviewed with patient? No    SHORT TERM GOALS=LONG TERM GOALS: Target date: 12/26/2023     Patient to demonstrate independence in HEP  Baseline: [email or web address] Goal status: ongoing  2.  Patient will acknowledge 4/10 pain at least once during episode of care   Baseline: 8/10 Goal status: ongoing  3.  Patient will score at least 20/50 on NDI to signify clinically meaningful improvement in functional abilities.   Baseline: 28/50 Goal status:  met 9/30  4.  160d AROM in B shoulder flexion/abduction Baseline: 150d Goal status: ongoing  5.  Improved cervical rotation ROM to 45 degrees needed for driving revised Goal status:   6. Be able to walk 1 hour or navigate hills at [facility] new  7. PSFS rating for computer work improved to 6-7 new  8. PSFS rating for cooking improved to 6 new   PLAN:  PT FREQUENCY: 1x/week  PT DURATION: 12 weeks   PLANNED INTERVENTIONS: 97110-Therapeutic exercises, 97530- Therapeutic activity, 97112- Neuromuscular re-education, 97535- Self Care, and [id number]- Manual therapy  PLAN FOR NEXT SESSION prone head lifts/UE lifts; try cervical SNAGS with towel; EMOM rounds as above;  cervical MRI in 2 weeks; postural strengthening; manual techniques as appropriate, aerobic tasks, ROM and flexibility activities  [employer]  Lional Icenogle, PT 10/03/23 9:37 PM Phone:  [phone] Fax: [phone]

## 2023-10-04 ENCOUNTER — Inpatient Hospital Stay: Attending: Hematology & Oncology

## 2023-10-04 ENCOUNTER — Telehealth: Payer: Self-pay

## 2023-10-04 ENCOUNTER — Other Ambulatory Visit: Payer: Self-pay

## 2023-10-04 LAB — CBC WITH DIFFERENTIAL (CANCER CENTER ONLY)
Abs Immature Granulocytes: 0.01 K/uL (ref 0.00–0.07)
Basophils Absolute: 0 K/uL (ref 0.0–0.1)
Basophils Relative: 0 %
Eosinophils Absolute: 0.1 K/uL (ref 0.0–0.5)
Eosinophils Relative: 3 %
HCT: 39.2 % (ref 36.0–46.0)
Hemoglobin: 12.6 g/dL (ref 12.0–15.0)
Immature Granulocytes: 0 %
Lymphocytes Relative: 36 %
Lymphs Abs: 1.6 K/uL (ref 0.7–4.0)
MCH: 31.9 pg (ref 26.0–34.0)
MCHC: 32.1 g/dL (ref 30.0–36.0)
MCV: 99.2 fL (ref 80.0–100.0)
Monocytes Absolute: 0.4 K/uL (ref 0.1–1.0)
Monocytes Relative: 8 %
Neutro Abs: 2.4 K/uL (ref 1.7–7.7)
Neutrophils Relative %: 53 %
Platelet Count: 204 K/uL (ref 150–400)
RBC: 3.95 MIL/uL (ref 3.87–5.11)
RDW: 12.5 % (ref 11.5–15.5)
WBC Count: 4.5 K/uL (ref 4.0–10.5)
nRBC: 0 % (ref 0.0–0.2)

## 2023-10-04 LAB — IRON AND IRON BINDING CAPACITY (CC-WL,HP ONLY)
Iron: 97 ug/dL (ref 28–170)
Saturation Ratios: 29 % (ref 10.4–31.8)
TIBC: 332 ug/dL (ref 250–450)
UIBC: 235 ug/dL

## 2023-10-04 LAB — FERRITIN: Ferritin: 153 ng/mL (ref 11–307)

## 2023-10-04 NOTE — Telephone Encounter (Signed)
 Received phone call from patient stating that she has been having nausea, abdominal pain and fatigue for a few days. Pt requesting to have her iron levels checked. Dr. Timmy aware and agreeable to having labs only drawn. Appointment made and pt stated she will have labs drawn peripherally and have her port flushed next week at her scheduled appointment.  Orders placed. No further needs identified and pt appreciative of call and assistance.

## 2023-10-05 ENCOUNTER — Ambulatory Visit: Attending: Surgery | Admitting: Occupational Therapy

## 2023-10-05 ENCOUNTER — Encounter (HOSPITAL_BASED_OUTPATIENT_CLINIC_OR_DEPARTMENT_OTHER): Payer: Self-pay | Admitting: Obstetrics & Gynecology

## 2023-10-05 ENCOUNTER — Ambulatory Visit: Payer: Self-pay | Admitting: Hematology & Oncology

## 2023-10-05 ENCOUNTER — Encounter: Payer: Self-pay | Admitting: Occupational Therapy

## 2023-10-05 ENCOUNTER — Other Ambulatory Visit (HOSPITAL_COMMUNITY): Payer: Self-pay

## 2023-10-05 ENCOUNTER — Ambulatory Visit (HOSPITAL_BASED_OUTPATIENT_CLINIC_OR_DEPARTMENT_OTHER): Admitting: Obstetrics & Gynecology

## 2023-10-05 VITALS — BP 124/89 | HR 78 | Wt 216.6 lb

## 2023-10-05 DIAGNOSIS — L280 Lichen simplex chronicus: Secondary | ICD-10-CM | POA: Diagnosis not present

## 2023-10-05 DIAGNOSIS — R238 Other skin changes: Secondary | ICD-10-CM

## 2023-10-05 DIAGNOSIS — I890 Lymphedema, not elsewhere classified: Secondary | ICD-10-CM | POA: Insufficient documentation

## 2023-10-05 DIAGNOSIS — L900 Lichen sclerosus et atrophicus: Secondary | ICD-10-CM | POA: Diagnosis not present

## 2023-10-05 NOTE — Therapy (Deleted)
 OUTPATIENT PHYSICAL THERAPY CERVICAL TREATMENT/RECERTIFICATION   Patient Name: Karen [name] MRN: [id number] DOB:Oct 21, 1970, 53 y.o., female Today's Date: 10/05/2023  END OF SESSION:          Past Medical History:  Diagnosis Date   Adenomyosis    Anemia    Arthritis 2013   L knee gets steroid injections    Arthritis, rheumatic, acute or subacute    Asthmatic bronchitis 01/31/2017   Back pain    Chronic constipation    Diarrhea    DVT of lower extremity (deep venous thrombosis) (HCC)    Dysmenorrhea    Endometriosis    Esophageal stricture    G6PD deficiency    GERD (gastroesophageal reflux disease)    History of kidney stones    History of uterine fibroid    IBS (irritable bowel syndrome)    Interstitial cystitis    Lactose intolerance    Migraine    with aura   Other hemochromatosis 06/10/2021   PONV (postoperative nausea and vomiting)    Recurrent upper respiratory infection (URI)    Sebaceous cyst of breast, right lower inner quadrant 10/25/2012   Excised 11/21/12    Sleep apnea    Syncope, non cardiac    Past Surgical History:  Procedure Laterality Date   ARTHROSCOPIC HAGLUNDS REPAIR     BREAST CYST EXCISION Right 11/21/2012   Procedure: CYST EXCISION BREAST;  Surgeon: Sherlean JINNY Laughter, MD;  Location: [facility];  Service: General;  Laterality: Right;   BREAST CYST EXCISION Bilateral 01/18/2022   Procedure: EXCISION OF SEBACEOUS CYST BILATERAL BREAST;  Surgeon: Vanderbilt Ned, MD;  Location: [facility];  Service: General;  Laterality: Bilateral;   BREAST EXCISIONAL BIOPSY Right 11/2012   CESAREAN SECTION  04/19/1993   CHOLECYSTECTOMY     COLONOSCOPY  05/02/2011   Procedure: COLONOSCOPY;  Surgeon: Lynwood LITTIE Celestia Mickey., MD;  Location: [facility];  Service: Endoscopy;  Laterality: N/A;   colonoscopy  11/04/2014   DENTAL SURGERY  03/06/2018   2 surgeries ( 03/06/2018 and 04/06/2018)   to remove two separate benign tumors    ESOPHAGEAL MANOMETRY N/A 11/04/2015   Procedure: ESOPHAGEAL MANOMETRY (EM);  Surgeon: Lynwood Celestia, MD;  Location: [facility];  Service: Endoscopy;  Laterality: N/A;   ESOPHAGOGASTRODUODENOSCOPY  05/02/2011   Procedure: ESOPHAGOGASTRODUODENOSCOPY (EGD);  Surgeon: Lynwood LITTIE Celestia Mickey., MD;  Location: [facility];  Service: Endoscopy;  Laterality: N/A;   ESOPHAGOGASTRODUODENOSCOPY N/A 09/01/2014   Procedure: ESOPHAGOGASTRODUODENOSCOPY (EGD);  Surgeon: Lynwood Celestia, MD;  Location: [facility];  Service: Endoscopy;  Laterality: N/A;   IR CV LINE INJECTION  09/13/2023   IR IMAGING GUIDED PORT INSERTION  07/06/2023   KNEE SURGERY     LAPAROSCOPY     x 4   LESION REMOVAL N/A 01/18/2022   Procedure: EXCISION OF SEBACEOUS CYSTS CHEST AND NECK;  Surgeon: Vanderbilt Ned, MD;  Location: [facility];  Service: General;  Laterality: N/A;   PH IMPEDANCE STUDY N/A 11/04/2015   Procedure: PH IMPEDANCE STUDY;  Surgeon: Lynwood Celestia, MD;  Location: [facility];  Service: Endoscopy;  Laterality: N/A;   VAGINAL HYSTERECTOMY  2010   TVH--ovaries remain   Patient Active Problem List   Diagnosis Date Noted   Sjogren syndrome with keratoconjunctivitis 12/09/2022   Other hemochromatosis 06/10/2021   Elevated LFTs 04/04/2021   Colitis 04/04/2021   Bacterial overgrowth syndrome 05/21/2020   Obesity 05/21/2020   History of endometriosis 05/21/2020   Constipation due to outlet dysfunction 02/05/2020  Gastroesophageal reflux disease 02/05/2020   Obstructive sleep apnea treated with continuous positive airway pressure (CPAP) 06/16/2017   Perennial allergic rhinitis with a nonallergic component 01/31/2017   Asthmatic bronchitis 01/31/2017   GI symptoms 01/31/2017   Family history of colon cancer 01/27/2015   Chest pain 12/13/2012   Dyspnea 12/13/2012   DVT of lower extremity (deep venous thrombosis) (HCC) 01/03/1990    PCP: Elliot Charm, MD   REFERRING PROVIDER: Huey Redell Fallow, PA-C  REFERRING DIAG: M54.2 (ICD-10-CM) - Cervicalgia  THERAPY DIAG:  Lymphedema, not elsewhere classified  Rationale for Evaluation and Treatment: Rehabilitation  ONSET DATE: July 2025  SUBJECTIVE:                                                                                                                                                                                                         SUBJECTIVE STATEMENT: Having MRI next week.  I don't know if it's the weather or not but my neck is painful. Pops sometimes when I'm in the shower.  Goal is work on the computer for a period of time   Saw the neck specialist and felt there is narrowing and spurs.  MRI in 2 weeks.  He told me to avoid looking up.  Saw the rheumatologist and she said the medication would help for RA but not OA. She repots she has a blood clot in port. Went to ED after Labor Day.   Eval: Reports onset of L UT pain following port placement into R shoulder Hand dominance: Right  PERTINENT HISTORY:  07/21/2023 Assessment: - suspect left-sided cervical/ trapezius muscle spasm - reassured by absence of red flag signs/ symptoms at this time   Concurrent lymphedema PAIN: 10/03/23 Are you having pain? Yes: NPRS scale: 6/10 Pain location: L UT Pain description: ache, cramp Aggravating factors: activity Relieving factors: heat, medication  PRECAUTIONS: None  RED FLAGS: None     WEIGHT BEARING RESTRICTIONS: No  FALLS:  Has patient fallen in last 6 months? No  OCCUPATION: [employer]  PLOF: Independent  PATIENT GOALS: To manage my neck pain  NEXT MD VISIT: TBD  OBJECTIVE:  Note: Objective measures were completed at Evaluation unless otherwise noted.  DIAGNOSTIC FINDINGS:  none  PATIENT SURVEYS:  NDI: 28/50 56% perceived disability  Minimum Detectable Change (90% confidence): 5 points or 10% points  10/03/23:  NDI  30%   The Patient-Specific Functional Scale  Initial:  I am  going to ask you to identify up to 3 important activities that you are unable to do or  are having difficulty with as a result of this problem.  Today are there any activities that you are unable to do or having difficulty with because of this?  (Patient shown scale and patient rated each activity)  Follow up: When you first came in you had difficulty performing these activities.  Today do you still have difficulty?  Patient-Specific activity scoring scheme (Point to one number):  0 1 2 3 4 5 6 7 8 9  10 Unable                                                                                                          Able to perform To perform                                                                                                    activity at the same Activity         Level as before                                                                                                                       Injury or problem  Activity       Computer work 1 hour                                     9/30: 5                       2.          Cooking                                                           9/30: 5  3.         Sit and watch TV                                              9/30: 5                                                                         POSTURE: rounded shoulders and forward head  PALPATION: Global tenderness throughout B UT and posterior shoulder girdle   CERVICAL ROM:   Active ROM A/PROM (deg) eval 9/30  Flexion 75%P! 47  Extension 75%P! 45  Right lateral flexion 75%P! 36  Left lateral flexion 75%P! 29  Right rotation 75%P! 35  Left rotation 75%P! 29   (Blank rows = not tested)  UPPER EXTREMITY ROM:  Active ROM Right eval Left eval 9/30  Shoulder flexion 150 150 L 155  R 151  Shoulder extension     Shoulder abduction 150 150 L 167 R 161  Shoulder adduction     Shoulder extension      Shoulder internal rotation     Shoulder external rotation     Elbow flexion     Elbow extension     Wrist flexion     Wrist extension     Wrist ulnar deviation     Wrist radial deviation     Wrist pronation     Wrist supination      (Blank rows = not tested)  UPPER EXTREMITY MMT:  MMT Right eval Left eval 9/30  Shoulder flexion     Shoulder extension     Shoulder abduction     Shoulder adduction     Shoulder extension     Shoulder internal rotation     Shoulder external rotation     Middle trapezius   Bil 4-/5  Lower trapezius   Bil 4-/5  Elbow flexion     Elbow extension     Wrist flexion     Wrist extension     Wrist ulnar deviation     Wrist radial deviation     Wrist pronation     Wrist supination     Grip strength      (Blank rows = not tested)  CERVICAL SPECIAL TESTS:  Neck flexor muscle endurance test: Negative 9/30 decreased cervical extensor strength 4-/5  FUNCTIONAL TESTS:  N/A  TREATMENT           10/03/2023 NDI Cervical ROM Shoulder ROM PSFS for functional activities as above Prone head lift with bil arm extension 5x Prone head lift/arm T lift  5x Standing median nerve floss 10x right/left review with cues for full elbow extension Verbal review of Standing rows with green band with handles (pt given for home use)   09/27/2023 Discussed findings of her recent consult with the neck specialist and using pain as her guide on movement; also discussed the benefits of movement for nerve health Manual: seated soft tissue mobilization to right > left cervical paraspinals and suboccipitals; grade 2 cervical distraction 3x 10; avoided right upper trap soft tissue mob secondary to blood clot  in right port Instruction in seated self traction with fist or towel (Added to HEP- see below)  Standing median nerve floss 10x right/left (Added to HEP- see below) Standing scapular retraction 10x Standing rows with green band with handles (pt given for home use) 10x  (Added to HEP- see below) Discussed EMOM (every minute on the minute) with walking for cardio 1 min, then 1 min of nerve floss, then retractions or band rows for 1 min, then rest 1 min for 2-3 rounds           09/19/2023 Manual: seated soft tissue mobilization to bilateral upper trap, levator scap, bilateral cervical paraspinals  Seated shoulder circles x 10 each direction Standing pelvic tilt x 4 (patient braced whole body) Hooklying pelvic tilt x 20 Hooklying bent knee fall out x 10 bilateral  Hooklying TA activation + march x 10 bilateral  Cervical Melt method with foam roll (flexion/extension and rotation) x 20 each direction Chin tucks against foam roll x 12 Seated reclined crunch x 8  Standing TA activation + march x 5 each    09/12/2023 Manual: seated soft tissue mobilization to bilateral upper trap, levator scap, bilateral cervical paraspinals  Upper trap stretch 2 x 20 sec bilateral  Scalene 2 x 20 sec bilateral  Cervical Melt method with foam roll (flexion/extension and rotation) x 20 each direction Chin tucks against foam roll x 12 Supine scap squeezes x 12 Supine serratus punch x 12 bilateral  Hooklying PPT 2 x 10 Bent knee fall out + TA activation x 10 bilateral  Supine shoulder extension isometric 2 x 10 3 sec hold                                                                                                      PATIENT EDUCATION:  Education details: Discussed eval findings, rehab rationale and POC and patient is in agreement  Person educated: Patient Education method: Explanation and Handouts Education comprehension: verbalized understanding and needs further education  HOME EXERCISE PROGRAM: Access Code: [email or web address] URL: [email or web address] Date: 09/27/2023 Prepared by: Glade Pesa  Exercises - Seated Shoulder Shrugs  - 2 x daily - 5 x weekly - 2 sets - 10 reps - Seated Scapular Retraction  - 2 x daily - 5 x weekly - 2 sets - 10 reps - Gentle  Levator Scapulae Stretch  - 1 x daily - 7 x weekly - 1 sets - 2 reps - 30 seconds hold - Seated Cervical Sidebending Stretch  - 1 x daily - 7 x weekly - 1 sets - 2 reps - 30 seconds hold - Quadricep Stretch with Chair and Counter Support  - 1 x daily - 7 x weekly - 2 sets - 20-30s hold - Standing Hamstring Stretch with Step  - 1 x daily - 7 x weekly - 2 sets - 20-30s hold - Seated Hamstring Stretch  - 1 x daily - 7 x weekly - 2 sets - 20-30 hold - Seated Hip Flexor Stretch  - 1 x daily - 7 x weekly - 2 sets - 20-30 hold -  Seated Scalene Stretch with Towel  - 1 x daily - 7 x weekly - 2 sets - 20 reps - Seated Modified Small Range Crunch in Chair  - 1 x daily - 7 x weekly - 1-2 sets - 10 reps - Standing Marching  - 1 x daily - 7 x weekly - 1 sets - 10 reps - Median Nerve Flossing - Tray  - 1 x daily - 7 x weekly - 1 sets - 10 reps - Seated Cervical Traction  - 1 x daily - 7 x weekly - 1 sets - 10 reps - Seated Neck and Traction  - 1 x daily - 7 x weekly - 1 sets - 10 reps - Standing Low Shoulder Row with Anchored Resistance  - 1 x daily - 7 x weekly - 1 sets - 10 reps ASSESSMENT:  CLINICAL IMPRESSION: The patient has a significant improvement in Neck Disability Index from 56% to 30%.  Her shoulder ROM has improved but still limited with elevation needed for reaching high shelves.  Cervical ROM with modest improvements but limited motion needed for driving (particularly rotation).  The patient would benefit from a continuation of skilled PT for a further progression of strengthening and functional mobility.  Will continue to update and promote independence in a HEP needed for a return to the highest functional level possible with ADLs.        OBJECTIVE IMPAIRMENTS: decreased activity tolerance, decreased mobility, decreased ROM, increased fascial restrictions, increased muscle spasms, impaired UE functional use, postural dysfunction, and pain.   ACTIVITY LIMITATIONS: carrying, lifting, bending,  bed mobility, and reach over head  PERSONAL FACTORS: Age, Fitness, Past/current experiences, and 1 comorbidity: lymphedema are also affecting patient's functional outcome.   REHAB POTENTIAL: Good  CLINICAL DECISION MAKING: Stable/uncomplicated  EVALUATION COMPLEXITY: Low   GOALS: Goals reviewed with patient? No    SHORT TERM GOALS=LONG TERM GOALS: Target date: 12/26/2023     Patient to demonstrate independence in HEP  Baseline: [email or web address] Goal status: ongoing  2.  Patient will acknowledge 4/10 pain at least once during episode of care   Baseline: 8/10 Goal status: ongoing  3.  Patient will score at least 20/50 on NDI to signify clinically meaningful improvement in functional abilities.   Baseline: 28/50 Goal status:  met 9/30  4.  160d AROM in B shoulder flexion/abduction Baseline: 150d Goal status: ongoing  5.  Improved cervical rotation ROM to 45 degrees needed for driving revised Goal status:   6. Be able to walk 1 hour or navigate hills at [facility] new  7. PSFS rating for computer work improved to 6-7 new  8. PSFS rating for cooking improved to 6 new   PLAN:  PT FREQUENCY: 1x/week  PT DURATION: 12 weeks   PLANNED INTERVENTIONS: 97110-Therapeutic exercises, 97530- Therapeutic activity, 97112- Neuromuscular re-education, 97535- Self Care, and [id number]- Manual therapy  PLAN FOR NEXT SESSION prone head lifts/UE lifts; try cervical SNAGS with towel; EMOM rounds as above;  cervical MRI in 2 weeks; postural strengthening; manual techniques as appropriate, aerobic tasks, ROM and flexibility activities  Glade Pesa, PT 10/05/23 2:20 PM Phone: [phone] Fax: [phone]

## 2023-10-05 NOTE — Progress Notes (Signed)
 GYNECOLOGY  VISIT  CC:   3 month follow up, vulvar recheck  HPI: 53 y.o. G2P2 Married [place] American female here for vulvar recheck.  H/o vulvar irritation/itching with biopsy showing lichen simplex chronicus. Has been Dr. Marciano at [facility]  Has triamcinolone  0.1% topically and gabapentin  when needed.  Not having to use the topical steroid currently as symptoms are under good control.    On preservative free Humira .  Having this every 2 weeks.    Had blood work yesterday.  Having a phlebotomy scheduled on Friday.  Iron saturation was 29 and goal is to be <25.    Last mammogram was 12/2022.  Had colonoscopy 05/25/2023.  Has follow up in November.    Pt also asks if I think her skin looks more golden.  Concerned about possible jaundice.  Sclera normal.  Would like liver enzymes checked.     Past Medical History:  Diagnosis Date   Adenomyosis    Anemia    Arthritis 2013   L knee gets steroid injections    Arthritis, rheumatic, acute or subacute    Asthmatic bronchitis 01/31/2017   Back pain    Chronic constipation    Diarrhea    DVT of lower extremity (deep venous thrombosis) (HCC)    Dysmenorrhea    Endometriosis    Esophageal stricture    G6PD deficiency    GERD (gastroesophageal reflux disease)    History of kidney stones    History of uterine fibroid    IBS (irritable bowel syndrome)    Interstitial cystitis    Lactose intolerance    Migraine    with aura   Other hemochromatosis 06/10/2021   PONV (postoperative nausea and vomiting)    Recurrent upper respiratory infection (URI)    Sebaceous cyst of breast, right lower inner quadrant 10/25/2012   Excised 11/21/12    Sleep apnea    Syncope, non cardiac     MEDS:   Current Outpatient Medications on File Prior to Visit  Medication Sig Dispense Refill   Acetaminophen  500 MG capsule 1 capsule as needed Orally every 6 hrs     adalimumab  (HUMIRA , 2 PEN,) 40 MG/0.4ML pen 0.4 mL Subcutaneous EVERY 2  WEEKS for 30 days     adalimumab  (HUMIRA , 2 PEN,) 40 MG/0.4ML pen Inject 0.4 mLs (40 mg total) into the skin every 14 (fourteen) days. 2 each 5   albuterol  (VENTOLIN  HFA) 108 (90 Base) MCG/ACT inhaler Inhale 2 puffs into the lungs every 6 (six) hours as needed for wheezing or shortness of breath. 6.7 g 2   Elderberry 575 MG/5ML SYRP Take 30 mLs by mouth daily.     fluticasone  (FLONASE ) 50 MCG/ACT nasal spray Place 2 sprays into both nostrils daily. 16 g 5   Lactobacillus Casei-Folic Acid  (RESTORA RX) 60-1.25 MG CAPS Take 1 capsule by mouth every morning. 30 capsule 11   lansoprazole  (PREVACID ) 30 MG capsule Take 1 capsule (30 mg total) by mouth daily. 90 capsule 3   levocetirizine (XYZAL ) 5 MG tablet Take 1 tablet (5 mg total) by mouth every evening. 90 tablet 1   lidocaine -prilocaine  (EMLA ) cream Apply 1 Application topically as needed. 30 g 0   Lifitegrast  (XIIDRA ) 5 % SOLN Place 1 drop into both eyes 2 (two) times daily. 180 each 3   methocarbamol  (ROBAXIN ) 500 MG tablet Take 1 tablet (500 mg total) by mouth 3 (three) times daily. 30 tablet 1   ondansetron  (ZOFRAN -ODT) 4 MG disintegrating tablet Dissolve 1  tablet (4 mg total) in mouth every 8 (eight) hours as needed for nausea 30 tablet 2   ondansetron  (ZOFRAN -ODT) 4 MG disintegrating tablet Take 1 tablet (4 mg total) by mouth every 8 (eight) hours as needed for nausea. 30 tablet 2   polyethylene glycol (MIRALAX  / GLYCOLAX ) 17 g packet Take 17 g by mouth every other day.     rizatriptan  (MAXALT ) 5 MG tablet Take 1 tablet (5 mg total) by mouth as needed for migraine. May repeat in 2 hours if needed, no more than 2 pills/24 hours, no more than 3 pills/week. 10 tablet 0   triamcinolone  ointment (KENALOG ) 0.1 % Apply pea sized amount to affected vulvar and perianal area three times per week. Increase to twice daily for flares. 30 g 5   UNABLE TO FIND Med Name: NIFEDIPINE 0.3%/LIDO1.5% OINT     No current facility-administered medications on file  prior to visit.    ALLERGIES: Chlorhexidine , Molds & smuts, Alpha-gal, and Methotrexate   SH:  married, no nsmoker  Review of Systems  Constitutional: Negative.   Genitourinary: Negative.     PHYSICAL EXAMINATION:    BP 124/89 (BP Location: Left Arm, Patient Position: Sitting, Cuff Size: Large)   Pulse 78   Wt 216 lb 9.6 oz (98.2 kg)   SpO2 100%   BMI 34.00 kg/m     General appearance: alert, cooperative and appears stated age Lymph:  no inguinal LAD noted  Pelvic: External genitalia:  no lesions              Urethra:  normal appearing urethra with no masses, tenderness or lesions              Bartholins and Skenes: normal                 Vagina: normal mucosa without prolapse or lesions           Assessment/Plan: 1. Lichen simplex chronicus (Primary) - vulvar skin is normal.  Not using any topical steroid but does have rx on hand if needed.  Has seen specialist, Dr. Marciano at [facility]  2. Lichen sclerosus  3. Change of skin color - will obtain lab work today - Hepatic function panel  Total time with pt:  22 minutes.  Additional documentation time:  3 minutes. Total time:  25 minutes

## 2023-10-05 NOTE — Therapy (Signed)
 OUTPATIENT OCCUPATIONAL THERAPY TREATMENT NOTE  BLE/ BLQ LYMPHEDEMA  Patient Name: Karen [name] MRN: [id number] DOB:15-Jun-1970, 53 y.o., female Today's Date: 10/05/2023  REPORTING PERIOD:  END OF SESSION:  OT End of Session - 10/05/23 1418     Visit Number 56    Number of Visits 72    Date for Recertification  11/16/23    OT Start Time 0100    OT Stop Time 0215    OT Time Calculation (min) 75 min    Activity Tolerance Patient tolerated treatment well;No increased pain                Past Medical History:  Diagnosis Date   Adenomyosis    Anemia    Arthritis 2013   L knee gets steroid injections    Arthritis, rheumatic, acute or subacute    Asthmatic bronchitis 01/31/2017   Back pain    Chronic constipation    Diarrhea    DVT of lower extremity (deep venous thrombosis) (HCC)    Dysmenorrhea    Endometriosis    Esophageal stricture    G6PD deficiency    GERD (gastroesophageal reflux disease)    History of kidney stones    History of uterine fibroid    IBS (irritable bowel syndrome)    Interstitial cystitis    Lactose intolerance    Migraine    with aura   Other hemochromatosis 06/10/2021   PONV (postoperative nausea and vomiting)    Recurrent upper respiratory infection (URI)    Sebaceous cyst of breast, right lower inner quadrant 10/25/2012   Excised 11/21/12    Sleep apnea    Syncope, non cardiac    Past Surgical History:  Procedure Laterality Date   ARTHROSCOPIC HAGLUNDS REPAIR     BREAST CYST EXCISION Right 11/21/2012   Procedure: CYST EXCISION BREAST;  Surgeon: Sherlean JINNY Laughter, MD;  Location: [facility];  Service: General;  Laterality: Right;   BREAST CYST EXCISION Bilateral 01/18/2022   Procedure: EXCISION OF SEBACEOUS CYST BILATERAL BREAST;  Surgeon: Vanderbilt Ned, MD;  Location: [facility];  Service: General;  Laterality: Bilateral;   BREAST EXCISIONAL BIOPSY Right 11/2012   CESAREAN SECTION  04/19/1993    CHOLECYSTECTOMY     COLONOSCOPY  05/02/2011   Procedure: COLONOSCOPY;  Surgeon: Lynwood LITTIE Celestia Mickey., MD;  Location: [facility];  Service: Endoscopy;  Laterality: N/A;   colonoscopy  11/04/2014   DENTAL SURGERY  03/06/2018   2 surgeries ( 03/06/2018 and 04/06/2018)   to remove two separate benign tumors   ESOPHAGEAL MANOMETRY N/A 11/04/2015   Procedure: ESOPHAGEAL MANOMETRY (EM);  Surgeon: Lynwood Celestia, MD;  Location: [facility];  Service: Endoscopy;  Laterality: N/A;   ESOPHAGOGASTRODUODENOSCOPY  05/02/2011   Procedure: ESOPHAGOGASTRODUODENOSCOPY (EGD);  Surgeon: Lynwood LITTIE Celestia Mickey., MD;  Location: [facility];  Service: Endoscopy;  Laterality: N/A;   ESOPHAGOGASTRODUODENOSCOPY N/A 09/01/2014   Procedure: ESOPHAGOGASTRODUODENOSCOPY (EGD);  Surgeon: Lynwood Celestia, MD;  Location: [facility];  Service: Endoscopy;  Laterality: N/A;   IR CV LINE INJECTION  09/13/2023   IR IMAGING GUIDED PORT INSERTION  07/06/2023   KNEE SURGERY     LAPAROSCOPY     x 4   LESION REMOVAL N/A 01/18/2022   Procedure: EXCISION OF SEBACEOUS CYSTS CHEST AND NECK;  Surgeon: Vanderbilt Ned, MD;  Location:  S[facility];  Service: General;  Laterality: N/A;   PH IMPEDANCE STUDY N/A 11/04/2015   Procedure: PH IMPEDANCE STUDY;  Surgeon: Lynwood Celestia, MD;  Location: [facility];  Service: Endoscopy;  Laterality: N/A;   VAGINAL HYSTERECTOMY  2010   TVH--ovaries remain   Patient Active Problem List   Diagnosis Date Noted   Sjogren syndrome with keratoconjunctivitis 12/09/2022   Other hemochromatosis 06/10/2021   Elevated LFTs 04/04/2021   Colitis 04/04/2021   Bacterial overgrowth syndrome 05/21/2020   Obesity 05/21/2020   History of endometriosis 05/21/2020   Constipation due to outlet dysfunction 02/05/2020   Gastroesophageal reflux disease 02/05/2020   Obstructive sleep apnea treated with continuous positive airway pressure (CPAP) 06/16/2017   Perennial allergic rhinitis with a nonallergic component  01/31/2017   Asthmatic bronchitis 01/31/2017   GI symptoms 01/31/2017   Family history of colon cancer 01/27/2015   Chest pain 12/13/2012   Dyspnea 12/13/2012   DVT of lower extremity (deep venous thrombosis) (HCC) 01/03/1990    PCP: Gaile New, MD  REFERRING PROVIDER: Valery Ripple, MD  REFERRING DIAG: I89.0  THERAPY DIAG:  Lymphedema, not elsewhere classified  Rationale for Evaluation and Treatment: Rehabilitation  ONSET DATE: ~2014; exacerbation in 2023  SUBJECTIVE:                                                                                                                                                                                          SUBJECTIVE STATEMENT:  Pt presents for OT to address lower quadrant, including deep abdominal lymphatics, and LLE lymphedema w/ associated pain. Pt has no new complaints.  Pt rate pain/ discomfort in the abdomen at 2/10. She reports ongoing constipation alternating with diarrhea. Pt reports abdomenal scan identified 3 hernias.  PERTINENT HISTORY: CVI, OSA (no CPAP), GERD, Esophageal stricture, IBS, Lactose intolerant, Diarrhea, Hx LE DVT, recent RA dx  PAIN:  Are you having pain? Yes: 2/10 Pain location: abdomen; digestive discomfort Pain description: abdominal fullness, tightness Aggravating factors: standing, walking,  Relieving factors: MLD, Miralax ,   PRECAUTIONS: Other: LYMPHEDEMA; Hx LLE DVT  WEIGHT BEARING RESTRICTIONS: No  FALLS:  Has patient fallen in last 6 months? No  LIVING ENVIRONMENT: Lives with: lives with spouse and daughter Lives in: House/apartment Stairs: Yes; Internal: 14 steps; yes and External: 4 steps; yes Has following equipment at home: None  OCCUPATION: [employer] operational officer, walking, desk work  LEISURE: family time  HAND DOMINANCE: right   PRIOR LEVEL OF FUNCTION: Independent  PATIENT GOALS: Feel better, be able to be more active without pain, wear preferred street shoes. NEW  08/03/23: Be able to manage digestive discomfort and gut inflammation symptoms without medications  OBJECTIVE: Note: Objective measures were completed at Evaluation unless otherwise noted.  COGNITION:  Overall cognitive status: Within functional limits for tasks assessed  POSTURE: WFL  LE ROM: WFL  LE MMT: WFL  LYMPHEDEMA ASSESSMENTS: non-Ca related  INFECTIONS: denies cellulitis and wound hx  BLE COMPARATIVE LIMB VOLUMETRICS Initial 11/15/22  LANDMARK RIGHT (dominant)  R LEG (A-D) 3028.9 ml  R THIGH (E-G) 4809.60 ml  R FULL LIMB (A-G) 7838.5 ml  Limb Volume differential (LVD)  %  Volume change since initial %  Volume change overall V  (Blank rows = not tested)  LANDMARK LEFT  (Rx)  L LEG (A-D) 3189.9 ml  L THIGH (E-G) 4959.8 ml  L FULL LIMB (A-G) 8149.7 ml  Limb Volume differential (LVD)  LEG LVD = 5.32%, L>R; THIGH LVD = 3.12%, L>R; And full LLE LVD = 3.97%, L>R.   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  Skin  Description Hyper-Keratosis Peau d' Orange Shiny Tight Fibrotic/ Indurated Fatty Doughy Spongy/ boggy       R>L x  x   Skin dry Flaky WNL Macerated   mildly      Color Redness Varicosities Blanching Hemosiderin Stain Mottled        x   Odor Malodorous Yeast Fungal infection  WNL      x   Temperature Warm Cool wnl     x    Pitting Edema   1+ 2+ 3+ 4+ Non-pitting         x   Girth Symmetrical Asymmetrical                   Distribution    L>R toes to groin    Stemmer Sign Positive Negative   +L -R   Lymphorrhea History Of:  Present Absent      x    Wounds History Of Present Absent Venous Arterial Pressure Sheer     x        Signs of Infection Redness Warmth Erythema Acute Swelling Drainage Borders                    Sensation Light Touch Deep pressure Hypersensitivity   In tact Impaired In tact Impaired Absent Impaired   x  x  x     Nails WNL   Fungus nail dystrophy   x     Hair Growth Symmetrical Asymmetrical   x    Skin Creases Base of toes  Ankles   Base of Fingers knees       Abdominal pannus Thigh Lobules  Face/neck   x           GAIT: Distance walked: >500' Assistive device utilized: None Level of assistance: Complete Independence Comments: Functional ambulation and transfers [facility]  LYMPHEDEMA LIFE IMPACT SCALE (LLIS): Initial 11/15/22  50%  FOTO functional outcome measure: Deferred. FOTO discontinued.  TODAY'S TREATMENT:  MLD to abdominal lymphatics Pt edu for LE self care  PATIENT EDUCATION:  Continued Pt/ CG edu for how function of cisterna chyli, part of the thoracic duct of deep abdominal lymphatics, assists with digestion by filtering many of the fats and proteins from the digestive system. Sub optimal lymphatic flow in this region may result in constipation, bloating, swelling, etc. MLD helps mobilize these protein and fats , and the rhythmic movement of manual lymphatic drainage stimulates digestive muscles and increase peristalsis. Provided printed resource for reference.  Topics include outcome of comparative limb volumetrics- starting limb volume differentials (LVDs), technology and gradient techniques used for short stretch, multilayer compression wrapping, simple self-MLD, therapeutic lymphatic pumping exercises, skin/nail care, LE precautions,. compression garment recommendations and specifications, wear and care schedule and compression garment donning  / doffing w assistive devices. Discussed progress towards all OT goals since commencing CDT. All questions answered to the Pt's satisfaction. Good return. Person educated: Patient  Education method: Explanation, Demonstration, and Handouts Education comprehension: verbalized understanding, returned demonstration, verbal cues required, and needs further education  HOME EXERCISE PROGRAM: BLE lymphatic pumping there ex using- 1 sets of 10 reps, each exercise in order-  1-2 x daily, bilaterally Simple self MLD 1 x daily Daily skin care to increase hydration, skin mobility and decrease infection risk- can be done during MLD Compression wraps 23/7 during intensive phase of CDT Compression garments/ devices during self-management phase of CDT  ASSESSMENT:  CLINICAL IMPRESSION: Resumed short neck sequence since port tenderness and R lateral neck swelling is resolved this visit. Pt also continues to have digestive discomfort associated with chronic constipation alternating with diarrhea.  Pt tolerated the abdominal sequence as established without increased pain. Pt continues to  benefit from MLD for enhancing gut motility and bowel function.  MLD is also known to facilitate the removal of inflammatory substances and toxins from the intestinal area, which may help reduce inflammation linked to digestive disorders. Cont  1 x weekly as per POC.  11/15/22 Initial Evaluation: Karen [name] is a 53 y.o. female presenting with very mild, stage II, LLE lymphedema 2/2 venous insufficiency and obesity. L Leg swelling and associated pain swelling fluctuates. It has progressed over time, and now no longer resolves with elevation over night. RLE swelling limits balance during functional ambulation. It exacerbates infection and falls risk. LLE/RLQ lymphedema interferes with functional performance in all occupational domains, including basic and instrumental ADLs, productive activities, leisure pursuits and social  participation. Pt will benefit from Occupational Therapy for Complete Decongestive Therapy (CDT) to restore function, to reduce physical and psychologic suffering associated with chronic, progressive lymphedema and associated pain, and to limit infection. CDT will include manual lymphatic drainage (MLD), skin care, therapeutic exercise and compression wraps and garment's devices. Without skilled OT for lymphedema care the condition will worsen and further functional decline is expected  Custom-made gradient compression garments and HOS devices are medically necessary because they are uniquely sized and shaped to fit the exact dimensions of the affected extremities, and to provide appropriate medical grade, graduated compression essential for optimally managing chronic, progressive lymphedema. Multiple custom compression garments are needed to ensure proper hygiene to limit infection risk. Custom compression garments should be replaced q 3-6 months When worn consistently for optimal lipo-lymphedema self-management over time. HOS devices, medically necessary to limit fibrosis buildup in tissue, should be replaced q 2 years and PRN when worn out.      OBJECTIVE IMPAIRMENTS: decreased activity tolerance, decreased knowledge of condition, decreased knowledge of  use of DME, increased edema, impaired sensation, pain, and chronic, progressive leg swelling.   ACTIVITY LIMITATIONS: dependent sitting, extended standing and/ or walking, squatting, and lower body dressing, fitting preferred street shoes  PARTICIPATION LIMITATIONS: shopping, community activity, occupation, and yard work  PERSONAL FACTORS: 3+ comorbidities: Hx DVT,  OSA, CVI. Varicose veins are also affecting patient's functional outcome.   REHAB POTENTIAL: Good  EVALUATION COMPLEXITY: Moderate   GOALS: Goals reviewed with patient? Yes  SHORT TERM GOALS: Target date: 4th OT Rx visit  SHORT TERM GOALS: Target date: 4th OT Rx visit   Pt will  demonstrate understanding of lymphedema precautions and prevention strategies with modified independence using a printed reference to identify at least 5 precautions and discussing how s/he may implement them into daily life to reduce risk of progression with extra time. Baseline:Max A Goal status: GOAL MET   Pt will be able to apply multilayer, knee length, gradient, compression wraps to one leg at a time with modified assistance (extra time and assistive device/s) to decrease limb volume, to limit infection risk, and to limit lymphedema progression.  Baseline: Dependent Goal status: GOAL DISCHARGED. Pt Going to compression garments instead  LONG TERM GOALS: Target date: 02/08/23  Given this patient's Intake score of 67%/100% on the functional outcomes FOTO tool, patient will experience an increase in function of 5 points to improve basic and instrumental ADLs performance, including lymphedema self-care.  Baseline: Max A Goal status:GOAL DISCHARGED.  FOTO TOOL DISCONTINUED AT CLINIC   Given this patient's Intake score of 50% on the Lymphedema Life Impact Scale (LLIS), patient will experience a reduction of at least 5 points in her perceived level of functional impairment resulting from lymphedema to improve functional performance and quality of life (QOL). Baseline: 50% Goal status: PROGRESSING  Pt will achieve at least a 10% volume reduction in B legs to return limb to typical size and shape, to limit infection risk and LE progression, to decrease pain, to improve function. Baseline: Dependent Goal status: PROGRESSING  4.  Pt will obtain proper compression garments/devices and achieve modified independence (extra time + assistive devices) with donning/doffing to optimize limb volume reductions and limit LE  progression over time. Baseline:  Goal status:GOAL MET  5.  During Intensive phase CDT , with modified independence, Pt will achieve at least 85% compliance with all lymphedema self-care  home program components, including daily skin care, compression wraps and /or garments, simple self MLD and lymphatic pumping therex to habituate LE self care protocol  into ADLs for optimal LE self-management over time. Baseline: Dependent Goal status:GOAL MET   PLAN:  OT FREQUENCY: 1 /week  OT DURATION: 12 weeks and PRN  PLANNED INTERVENTIONS: 97110-Therapeutic exercises, 97530- Therapeutic activity, 97535- Self Care, [id number]- Manual therapy, Patient/Family education, Manual lymph drainage, Compression bandaging, and fit with appropriate compression garments  PLAN FOR NEXT SESSION:  Cont MLD as established Pt and family edu for LE self-care  Zebedee Dec, MS, OTR/L, CLT-LANA 10/05/23 2:22 PM

## 2023-10-06 ENCOUNTER — Other Ambulatory Visit: Payer: Self-pay

## 2023-10-06 LAB — HEPATIC FUNCTION PANEL
ALT: 18 IU/L (ref 0–32)
AST: 19 IU/L (ref 0–40)
Albumin: 4.5 g/dL (ref 3.8–4.9)
Alkaline Phosphatase: 134 IU/L (ref 49–135)
Bilirubin Total: 0.6 mg/dL (ref 0.0–1.2)
Bilirubin, Direct: 0.21 mg/dL (ref 0.00–0.40)
Total Protein: 7.3 g/dL (ref 6.0–8.5)

## 2023-10-06 NOTE — Progress Notes (Signed)
 Specialty Pharmacy Refill Coordination Note  Karen [name] is a 53 y.o. female contacted today regarding refills of specialty medication(s) Adalimumab  (Humira  (2 Pen))   Patient requested Pickup at [facility] at [place] date: 10/10/23   Medication will be filled on 10/09/23.

## 2023-10-07 ENCOUNTER — Ambulatory Visit (HOSPITAL_BASED_OUTPATIENT_CLINIC_OR_DEPARTMENT_OTHER): Payer: Self-pay | Admitting: Obstetrics & Gynecology

## 2023-10-09 ENCOUNTER — Ambulatory Visit: Admitting: Occupational Therapy

## 2023-10-09 DIAGNOSIS — I890 Lymphedema, not elsewhere classified: Secondary | ICD-10-CM | POA: Diagnosis not present

## 2023-10-09 NOTE — Therapy (Unsigned)
 OUTPATIENT OCCUPATIONAL THERAPY TREATMENT NOTE  BLE/ BLQ LYMPHEDEMA  Patient Name: Karen [name] MRN: [id number] DOB:12/04/1970, 53 y.o., female Today's Date: 10/10/2023  REPORTING PERIOD:  END OF SESSION:  OT End of Session - 10/10/23 1248     Visit Number 57    Number of Visits 72    Date for Recertification  11/16/23    OT Start Time 0100    OT Stop Time 0215    OT Time Calculation (min) 75 min    Activity Tolerance Patient tolerated treatment well;No increased pain    Behavior During Therapy [facility] for tasks assessed/performed                Past Medical History:  Diagnosis Date   Adenomyosis    Anemia    Arthritis 2013   L knee gets steroid injections    Arthritis, rheumatic, acute or subacute    Asthmatic bronchitis 01/31/2017   Back pain    Chronic constipation    Diarrhea    DVT of lower extremity (deep venous thrombosis) (HCC)    Dysmenorrhea    Endometriosis    Esophageal stricture    G6PD deficiency    GERD (gastroesophageal reflux disease)    History of kidney stones    History of uterine fibroid    IBS (irritable bowel syndrome)    Interstitial cystitis    Lactose intolerance    Migraine    with aura   Other hemochromatosis 06/10/2021   PONV (postoperative nausea and vomiting)    Recurrent upper respiratory infection (URI)    Sebaceous cyst of breast, right lower inner quadrant 10/25/2012   Excised 11/21/12    Sleep apnea    Syncope, non cardiac    Past Surgical History:  Procedure Laterality Date   ARTHROSCOPIC HAGLUNDS REPAIR     BREAST CYST EXCISION Right 11/21/2012   Procedure: CYST EXCISION BREAST;  Surgeon: Sherlean JINNY Laughter, MD;  Location: [facility];  Service: General;  Laterality: Right;   BREAST CYST EXCISION Bilateral 01/18/2022   Procedure: EXCISION OF SEBACEOUS CYST BILATERAL BREAST;  Surgeon: Vanderbilt Ned, MD;  Location: [facility];  Service: General;  Laterality: Bilateral;   BREAST  EXCISIONAL BIOPSY Right 11/2012   CESAREAN SECTION  04/19/1993   CHOLECYSTECTOMY     COLONOSCOPY  05/02/2011   Procedure: COLONOSCOPY;  Surgeon: Lynwood LITTIE Celestia Mickey., MD;  Location: [facility];  Service: Endoscopy;  Laterality: N/A;   colonoscopy  11/04/2014   DENTAL SURGERY  03/06/2018   2 surgeries ( 03/06/2018 and 04/06/2018)   to remove two separate benign tumors   ESOPHAGEAL MANOMETRY N/A 11/04/2015   Procedure: ESOPHAGEAL MANOMETRY (EM);  Surgeon: Lynwood Celestia, MD;  Location: [facility];  Service: Endoscopy;  Laterality: N/A;   ESOPHAGOGASTRODUODENOSCOPY  05/02/2011   Procedure: ESOPHAGOGASTRODUODENOSCOPY (EGD);  Surgeon: Lynwood LITTIE Celestia Mickey., MD;  Location: [facility];  Service: Endoscopy;  Laterality: N/A;   ESOPHAGOGASTRODUODENOSCOPY N/A 09/01/2014   Procedure: ESOPHAGOGASTRODUODENOSCOPY (EGD);  Surgeon: Lynwood Celestia, MD;  Location: [facility];  Service: Endoscopy;  Laterality: N/A;   IR CV LINE INJECTION  09/13/2023   IR IMAGING GUIDED PORT INSERTION  07/06/2023   KNEE SURGERY     LAPAROSCOPY     x 4   LESION REMOVAL N/A 01/18/2022   Procedure: EXCISION OF SEBACEOUS CYSTS CHEST AND NECK;  Surgeon: Vanderbilt Ned, MD;  Location: [facility];  Service: General;  Laterality: N/A;   PH IMPEDANCE STUDY N/A 11/04/2015  Procedure: PH IMPEDANCE STUDY;  Surgeon: Lynwood Bohr, MD;  Location: [facility];  Service: Endoscopy;  Laterality: N/A;   VAGINAL HYSTERECTOMY  2010   TVH--ovaries remain   Patient Active Problem List   Diagnosis Date Noted   Sjogren syndrome with keratoconjunctivitis 12/09/2022   Other hemochromatosis 06/10/2021   Elevated LFTs 04/04/2021   Colitis 04/04/2021   Bacterial overgrowth syndrome 05/21/2020   Obesity 05/21/2020   History of endometriosis 05/21/2020   Constipation due to outlet dysfunction 02/05/2020   Gastroesophageal reflux disease 02/05/2020   Obstructive sleep apnea treated with continuous positive airway pressure (CPAP)  06/16/2017   Perennial allergic rhinitis with a nonallergic component 01/31/2017   Asthmatic bronchitis 01/31/2017   GI symptoms 01/31/2017   Family history of colon cancer 01/27/2015   Chest pain 12/13/2012   Dyspnea 12/13/2012   DVT of lower extremity (deep venous thrombosis) (HCC) 01/03/1990    PCP: Gaile New, MD  REFERRING PROVIDER: Valery Ripple, MD  REFERRING DIAG: I89.0  THERAPY DIAG:  Lymphedema, not elsewhere classified  Rationale for Evaluation and Treatment: Rehabilitation  ONSET DATE: ~2014; exacerbation in 2023  SUBJECTIVE:                                                                                                                                                                                          SUBJECTIVE STATEMENT:  Pt presents for OT to address lower quadrant, including deep abdominal lymphatics, and LLE lymphedema w/ associated pain. Pt has no new complaints.  Pt rate pain/ discomfort in the abdomen at 2/10. She reports ongoing constipation alternating with diarrhea. Pt reports abdomenal scan identified 3 hernias.  PERTINENT HISTORY: CVI, OSA (no CPAP), GERD, Esophageal stricture, IBS, Lactose intolerant, Diarrhea, Hx LE DVT, recent RA dx  PAIN:  Are you having pain? Yes: 2/10 Pain location: abdomen; digestive discomfort Pain description: abdominal fullness, tightness Aggravating factors: standing, walking,  Relieving factors: MLD, Miralax ,   PRECAUTIONS: Other: LYMPHEDEMA; Hx LLE DVT  WEIGHT BEARING RESTRICTIONS: No  FALLS:  Has patient fallen in last 6 months? No  LIVING ENVIRONMENT: Lives with: lives with spouse and daughter Lives in: House/apartment Stairs: Yes; Internal: 14 steps; yes and External: 4 steps; yes Has following equipment at home: None  OCCUPATION: [employer] Services operational officer, walking, desk work  LEISURE: family time  HAND DOMINANCE: right   PRIOR LEVEL OF FUNCTION: Independent  PATIENT GOALS: Feel better, be  able to be more active without pain, wear preferred street shoes. NEW 08/03/23: Be able to manage digestive discomfort and gut inflammation symptoms without medications  OBJECTIVE: Note: Objective measures were completed at Evaluation unless otherwise noted.  COGNITION:  Overall cognitive status: Within functional limits for tasks assessed   POSTURE: WFL  LE ROM: WFL  LE MMT: WFL  LYMPHEDEMA ASSESSMENTS: non-Ca related  INFECTIONS: denies cellulitis and wound hx  BLE COMPARATIVE LIMB VOLUMETRICS Initial 11/15/22  LANDMARK RIGHT (dominant)  R LEG (A-D) 3028.9 ml  R THIGH (E-G) 4809.60 ml  R FULL LIMB (A-G) 7838.5 ml  Limb Volume differential (LVD)  %  Volume change since initial %  Volume change overall V  (Blank rows = not tested)  LANDMARK LEFT  (Rx)  L LEG (A-D) 3189.9 ml  L THIGH (E-G) 4959.8 ml  L FULL LIMB (A-G) 8149.7 ml  Limb Volume differential (LVD)  LEG LVD = 5.32%, L>R; THIGH LVD = 3.12%, L>R; And full LLE LVD = 3.97%, L>R.   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  Skin  Description Hyper-Keratosis Peau d' Orange Shiny Tight Fibrotic/ Indurated Fatty Doughy Spongy/ boggy       R>L x  x   Skin dry Flaky WNL Macerated   mildly      Color Redness Varicosities Blanching Hemosiderin Stain Mottled        x   Odor Malodorous Yeast Fungal infection  WNL      x   Temperature Warm Cool wnl     x    Pitting Edema   1+ 2+ 3+ 4+ Non-pitting         x   Girth Symmetrical Asymmetrical                   Distribution    L>R toes to groin    Stemmer Sign  Positive Negative   +L -R   Lymphorrhea History Of:  Present Absent     x    Wounds History Of Present Absent Venous Arterial Pressure Sheer     x        Signs of Infection Redness Warmth Erythema Acute Swelling Drainage Borders                    Sensation Light Touch Deep pressure Hypersensitivity   In tact Impaired In tact Impaired Absent Impaired   x  x  x     Nails WNL   Fungus nail dystrophy   x     Hair Growth Symmetrical Asymmetrical   x    Skin Creases Base of toes  Ankles   Base of Fingers knees       Abdominal pannus Thigh Lobules  Face/neck   x           GAIT: Distance walked: >500' Assistive device utilized: None Level of assistance: Complete Independence Comments: Functional ambulation and transfers [facility]  LYMPHEDEMA LIFE IMPACT SCALE (LLIS): Initial 11/15/22  50%  FOTO functional outcome measure: Deferred. FOTO discontinued.  TODAY'S TREATMENT:  MLD to abdominal lymphatics Pt edu for LE self care  PATIENT EDUCATION:  Continued Pt/ CG edu for how function of cisterna chyli, part of the thoracic duct of deep abdominal lymphatics, assists with digestion by filtering many of the fats and proteins from the digestive system. Sub optimal lymphatic flow in this region may result in constipation, bloating, swelling, etc. MLD helps mobilize these protein and fats , and the rhythmic movement of manual lymphatic drainage stimulates digestive muscles and increase peristalsis. Provided printed resource for reference.  Topics include outcome of comparative limb volumetrics- starting limb volume differentials (LVDs), technology and gradient techniques used for short stretch, multilayer compression wrapping, simple self-MLD, therapeutic lymphatic pumping exercises, skin/nail care, LE precautions,. compression garment recommendations and  specifications, wear and care schedule and compression garment donning / doffing w assistive devices. Discussed progress towards all OT goals since commencing CDT. All questions answered to the Pt's satisfaction. Good return. Person educated: Patient  Education method: Explanation, Demonstration, and Handouts Education comprehension: verbalized understanding, returned demonstration, verbal cues required, and needs further education  HOME EXERCISE PROGRAM: BLE lymphatic pumping there ex using- 1 sets of 10 reps, each exercise in order-  1-2 x daily, bilaterally Simple self MLD 1 x daily Daily skin care to increase hydration, skin mobility and decrease infection risk- can be done during MLD Compression wraps 23/7 during intensive phase of CDT Compression garments/ devices during self-management phase of CDT  ASSESSMENT:  CLINICAL IMPRESSION: Resumed short neck sequence since port tenderness and R lateral neck swelling is resolved this visit. Pt also continues to have digestive discomfort associated with chronic constipation alternating with diarrhea.  Pt tolerated the abdominal sequence as established without increased pain. Pt continues to  benefit from MLD for enhancing gut motility and bowel function.  MLD is also known to facilitate the removal of inflammatory substances and toxins from the intestinal area, which may help reduce inflammation linked to digestive disorders. Cont  1 x weekly as per POC.  11/15/22 Initial Evaluation: Ernesha Ramone is a 53 y.o. female presenting with very mild, stage II, LLE lymphedema 2/2 venous insufficiency and obesity. L Leg swelling and associated pain swelling fluctuates. It has progressed over time, and now no longer resolves with elevation over night. RLE swelling limits balance during functional ambulation. It exacerbates infection and falls risk. LLE/RLQ lymphedema interferes with functional performance in all occupational domains, including basic and  instrumental ADLs, productive activities, leisure pursuits and social participation. Pt will benefit from Occupational Therapy for Complete Decongestive Therapy (CDT) to restore function, to reduce physical and psychologic suffering associated with chronic, progressive lymphedema and associated pain, and to limit infection. CDT will include manual lymphatic drainage (MLD), skin care, therapeutic exercise and compression wraps and garment's devices. Without skilled OT for lymphedema care the condition will worsen and further functional decline is expected  Custom-made gradient compression garments and HOS devices are medically necessary because they are uniquely sized and shaped to fit the exact dimensions of the affected extremities, and to provide appropriate medical grade, graduated compression essential for optimally managing chronic, progressive lymphedema. Multiple custom compression garments are needed to ensure proper hygiene to limit infection risk. Custom compression garments should be replaced q 3-6 months When worn consistently for optimal lipo-lymphedema self-management over time. HOS devices, medically necessary to limit fibrosis buildup in tissue, should be replaced q 2 years and PRN when worn out.      OBJECTIVE IMPAIRMENTS: decreased activity tolerance, decreased knowledge of condition, decreased knowledge of  use of DME, increased edema, impaired sensation, pain, and chronic, progressive leg swelling.   ACTIVITY LIMITATIONS: dependent sitting, extended standing and/ or walking, squatting, and lower body dressing, fitting preferred street shoes  PARTICIPATION LIMITATIONS: shopping, community activity, occupation, and yard work  PERSONAL FACTORS: 3+ comorbidities: Hx DVT,  OSA, CVI. Varicose veins are also affecting patient's functional outcome.   REHAB POTENTIAL: Good  EVALUATION COMPLEXITY: Moderate   GOALS: Goals reviewed with patient? Yes  SHORT TERM GOALS: Target date: 4th OT  Rx visit  SHORT TERM GOALS: Target date: 4th OT Rx visit   Pt will demonstrate understanding of lymphedema precautions and prevention strategies with modified independence using a printed reference to identify at least 5 precautions and discussing how s/he may implement them into daily life to reduce risk of progression with extra time. Baseline:Max A Goal status: GOAL MET   Pt will be able to apply multilayer, knee length, gradient, compression wraps to one leg at a time with modified assistance (extra time and assistive device/s) to decrease limb volume, to limit infection risk, and to limit lymphedema progression.  Baseline: Dependent Goal status: GOAL DISCHARGED. Pt Going to compression garments instead  LONG TERM GOALS: Target date: 02/08/23  Given this patient's Intake score of 67%/100% on the functional outcomes FOTO tool, patient will experience an increase in function of 5 points to improve basic and instrumental ADLs performance, including lymphedema self-care.  Baseline: Max A Goal status:GOAL DISCHARGED.  FOTO TOOL DISCONTINUED AT CLINIC   Given this patient's Intake score of 50% on the Lymphedema Life Impact Scale (LLIS), patient will experience a reduction of at least 5 points in her perceived level of functional impairment resulting from lymphedema to improve functional performance and quality of life (QOL). Baseline: 50% Goal status: PROGRESSING  Pt will achieve at least a 10% volume reduction in B legs to return limb to typical size and shape, to limit infection risk and LE progression, to decrease pain, to improve function. Baseline: Dependent Goal status: PROGRESSING  4.  Pt will obtain proper compression garments/devices and achieve modified independence (extra time + assistive devices) with donning/doffing to optimize limb volume reductions and limit LE  progression over time. Baseline:  Goal status:GOAL MET  5.  During Intensive phase CDT , with modified  independence, Pt will achieve at least 85% compliance with all lymphedema self-care home program components, including daily skin care, compression wraps and /or garments, simple self MLD and lymphatic pumping therex to habituate LE self care protocol  into ADLs for optimal LE self-management over time. Baseline: Dependent Goal status:GOAL MET   PLAN:  OT FREQUENCY: 1 /week  OT DURATION: 12 weeks and PRN  PLANNED INTERVENTIONS: 97110-Therapeutic exercises, 97530- Therapeutic activity, 97535- Self Care, [id number]- Manual therapy, Patient/Family education, Manual lymph drainage, Compression bandaging, and fit with appropriate compression garments  PLAN FOR NEXT SESSION:  Cont MLD as established Pt and family edu for LE self-care  Zebedee Dec, MS, OTR/L, CLT-LANA 10/10/23 12:49 PM

## 2023-10-10 ENCOUNTER — Encounter: Payer: Self-pay | Admitting: Occupational Therapy

## 2023-10-10 ENCOUNTER — Ambulatory Visit: Attending: Obstetrics & Gynecology

## 2023-10-10 DIAGNOSIS — M62838 Other muscle spasm: Secondary | ICD-10-CM | POA: Insufficient documentation

## 2023-10-10 DIAGNOSIS — M6281 Muscle weakness (generalized): Secondary | ICD-10-CM | POA: Diagnosis not present

## 2023-10-10 DIAGNOSIS — R279 Unspecified lack of coordination: Secondary | ICD-10-CM | POA: Insufficient documentation

## 2023-10-10 DIAGNOSIS — R102 Pelvic and perineal pain unspecified side: Secondary | ICD-10-CM | POA: Insufficient documentation

## 2023-10-10 DIAGNOSIS — M5459 Other low back pain: Secondary | ICD-10-CM | POA: Insufficient documentation

## 2023-10-10 NOTE — Therapy (Signed)
 OUTPATIENT PHYSICAL THERAPY FEMALE PELVIC TREATMENT     Patient Name: Karen [name] MRN: [id number] DOB:04-13-1970, 53 y.o., female Today's Date: 10/10/2023  END OF SESSION:  PT End of Session - 10/10/23 0847     Visit Number 52    Date for Recertification  03/26/24    Authorization Type [name]    Authorization Time Period signed dry needling consent form 08/08/23    PT Start Time 0847    PT Stop Time 0931    PT Time Calculation (min) 44 min    Activity Tolerance Patient tolerated treatment well    Behavior During Therapy [facility] for tasks assessed/performed                     Past Medical History:  Diagnosis Date   Adenomyosis    Anemia    Arthritis 2013   L knee gets steroid injections    Arthritis, rheumatic, acute or subacute    Asthmatic bronchitis 01/31/2017   Back pain    Chronic constipation    Diarrhea    DVT of lower extremity (deep venous thrombosis) (HCC)    Dysmenorrhea    Endometriosis    Esophageal stricture    G6PD deficiency    GERD (gastroesophageal reflux disease)    History of kidney stones    History of uterine fibroid    IBS (irritable bowel syndrome)    Interstitial cystitis    Lactose intolerance    Migraine    with aura   Other hemochromatosis 06/10/2021   PONV (postoperative nausea and vomiting)    Recurrent upper respiratory infection (URI)    Sebaceous cyst of breast, right lower inner quadrant 10/25/2012   Excised 11/21/12    Sleep apnea    Syncope, non cardiac    Past Surgical History:  Procedure Laterality Date   ARTHROSCOPIC HAGLUNDS REPAIR     BREAST CYST EXCISION Right 11/21/2012   Procedure: CYST EXCISION BREAST;  Surgeon: Sherlean JINNY Laughter, MD;  Location: [facility];  Service: General;  Laterality: Right;   BREAST CYST EXCISION Bilateral 01/18/2022   Procedure: EXCISION OF SEBACEOUS CYST BILATERAL BREAST;  Surgeon: Vanderbilt Ned, MD;  Location: [facility];   Service: General;  Laterality: Bilateral;   BREAST EXCISIONAL BIOPSY Right 11/2012   CESAREAN SECTION  04/19/1993   CHOLECYSTECTOMY     COLONOSCOPY  05/02/2011   Procedure: COLONOSCOPY;  Surgeon: Lynwood LITTIE Celestia Mickey., MD;  Location: [facility];  Service: Endoscopy;  Laterality: N/A;   colonoscopy  11/04/2014   DENTAL SURGERY  03/06/2018   2 surgeries ( 03/06/2018 and 04/06/2018)   to remove two separate benign tumors   ESOPHAGEAL MANOMETRY N/A 11/04/2015   Procedure: ESOPHAGEAL MANOMETRY (EM);  Surgeon: Lynwood Celestia, MD;  Location: [facility];  Service: Endoscopy;  Laterality: N/A;   ESOPHAGOGASTRODUODENOSCOPY  05/02/2011   Procedure: ESOPHAGOGASTRODUODENOSCOPY (EGD);  Surgeon: Lynwood LITTIE Celestia Mickey., MD;  Location: [facility];  Service: Endoscopy;  Laterality: N/A;   ESOPHAGOGASTRODUODENOSCOPY N/A 09/01/2014   Procedure: ESOPHAGOGASTRODUODENOSCOPY (EGD);  Surgeon: Lynwood Celestia, MD;  Location: [facility];  Service: Endoscopy;  Laterality: N/A;   IR CV LINE INJECTION  09/13/2023   IR IMAGING GUIDED PORT INSERTION  07/06/2023   KNEE SURGERY     LAPAROSCOPY     x 4   LESION REMOVAL N/A 01/18/2022   Procedure: EXCISION OF SEBACEOUS CYSTS CHEST AND NECK;  Surgeon: Vanderbilt Ned, MD;  Location: [facility];  Service: General;  Laterality: N/A;   PH IMPEDANCE STUDY N/A 11/04/2015   Procedure: PH IMPEDANCE STUDY;  Surgeon: Lynwood Bohr, MD;  Location: [facility];  Service: Endoscopy;  Laterality: N/A;   VAGINAL HYSTERECTOMY  2010   TVH--ovaries remain   Patient Active Problem List   Diagnosis Date Noted   Sjogren syndrome with keratoconjunctivitis 12/09/2022   Other hemochromatosis 06/10/2021   Elevated LFTs 04/04/2021   Colitis 04/04/2021   Bacterial overgrowth syndrome 05/21/2020   Obesity 05/21/2020   History of endometriosis 05/21/2020   Constipation due to outlet dysfunction 02/05/2020   Gastroesophageal reflux disease 02/05/2020   Obstructive sleep apnea treated  with continuous positive airway pressure (CPAP) 06/16/2017   Perennial allergic rhinitis with a nonallergic component 01/31/2017   Asthmatic bronchitis 01/31/2017   GI symptoms 01/31/2017   Family history of colon cancer 01/27/2015   Chest pain 12/13/2012   Dyspnea 12/13/2012   DVT of lower extremity (deep venous thrombosis) (HCC) 01/03/1990    PCP: Elliot Charm, MD  REFERRING PROVIDER: Cleotilde Ronal RAMAN, MD   REFERRING DIAG: M62.89 (ICD-10-CM) - Pelvic floor dysfunction  THERAPY DIAG:  Muscle weakness (generalized)  Other muscle spasm  Other low back pain  Pelvic pain  Unspecified lack of coordination  Rationale for Evaluation and Treatment: Rehabilitation  ONSET DATE: chronic  SUBJECTIVE:                                                                                                                                                                                           SUBJECTIVE STATEMENT:  Pt states that she has to have phlebotomy due to iron overload. She is going to have port flush and TPA at the same time to take care of blood clot on port. She has continued walking every day. She will see nutritionist next Friday. She is working on increasing fiber and cutting back on water a little bit. Bowel movements have been very good. She walked for 2/5 hours one day.   PAIN: 10/09/23 Are you having pain? Yes NPRS scale: 3/10 Pain location: Lower abdominal pain, Lt sided pain, low back pain  Pain type: turning, knotted, pressure Pain description: intermittent and constant   Aggravating factors: constipation or frequent bowel movements/constantly having bowel movements  Relieving factors: exercises (bowel massage, happy baby, spinal twists, walking)  PRECAUTIONS: None  WEIGHT BEARING RESTRICTIONS: No  FALLS:  Has patient fallen in last 6 months? No  LIVING ENVIRONMENT: Lives with: lives with their spouse Lives in: House/apartment  OCCUPATION: nurse, in  admin  PLOF: Independent  PATIENT GOALS: decrease pain and have more normal bowel movements  PERTINENT HISTORY:  Vaginal hysterectomy, DVT LE, endometriosis, interstitial cystitis, exploratory laps for endometriosis, c-section, cholecystectomy, RA diagnosed 06/2023 Sexual abuse: No  BOWEL MOVEMENT: Pain with bowel movement: Yes Type of bowel movement:Type (Bristol Stool Scale) 1-7 (sometimes full range in the same bowel movement), Frequency sometimes many times a day, sometimes up to 4 days in between, and Strain Yes Fully empty rectum: No Leakage: No Pads: No Fiber supplement: No - has attempted metamucil and it made constipation worse  URINATION: Pain with urination: Yes Fully empty bladder: No Stream: varies  Urgency: Yes: all the time Frequency: multiple times an hours  Leakage: Urge to void, Walking to the bathroom, Coughing, Sneezing, Laughing, and Exercise Pads: Yes: daily  INTERCOURSE: Pain with intercourse: Initial Penetration and During Penetration Ability to have vaginal penetration:  Yes: with pain Climax: non painful WNL   PREGNANCY: Vaginal deliveries 1 Tearing Yes: 3rd degree tear C-section deliveries 1 Currently pregnant No  PROLAPSE: Periodically will feel vaginal bulging, heaviness in lower abdomen   OBJECTIVE:  10/10/23 Curl-up test: Minimal abdominal distortion focused in upper abdominals, largely improved Transversus abdominus: good core facilitation with exhale Sit-up test: 1/3  LUMBARAROM/PROM:  A/PROM A/PROM  Eval (% available) 11/09/22 (% available) 04/25/23 (% available) 10/10/23 (% available)  Flexion 50 90 90 100  Extension 25, pain anteriorly  25, pain in low back 50 75  Right lateral flexion 50, pain anteriorly  75, pain on right 50, pain on Rt 75  Left lateral flexion 50, pain anteriorly  75, pain on right 75 75   (Blank rows = not tested)    05/04/23:               Internal Pelvic Floor: mild tenderness in bil levator ani, but no  increase in tension; with internal rectal exam, she was demonstrating paradoxical contraction when trying to eccentrically lengthen with exhale and had very hard time correcting  Patient confirms identification and approves PT to assess internal pelvic floor and treatment Yes  PELVIC MMT:   MMT eval  Vaginal 2/5, 3 second hold, 3 repeat contractions  Internal Anal Sphincter 1/5  External Anal Sphincter 2/5  Puborectalis 1/5  Diastasis Recti   (Blank rows = not tested)        TONE: WNL   PROLAPSE: Grade 2 anterior vaginal wall laxity  04/25/23: Curl-up test: abdominal distortion Transversus abdominus: week, difficulty getting active contraction (not been working on strengthening)    LUMBARAROM/PROM:  A/PROM A/PROM  Eval (% available) 11/09/22 (% available) 04/25/23 (% available)  Flexion 50 90 90  Extension 25, pain anteriorly  25, pain in low back 50  Right lateral flexion 50, pain anteriorly  75, pain on right 50, pain on Rt  Left lateral flexion 50, pain anteriorly  75, pain on right 75   (Blank rows = not tested)  11/09/22: No internal rectal or vaginal pelvic floor exam performed due to high level of skin irritation   Weak transversus abdominus contraction  Abdominal restriction and tenderness in bil lower quadrant  Unable to perform pelvic tilt with appropriate coordination  LUMBARAROM/PROM:  A/PROM A/PROM  Eval (% available) 11/09/22 (% available)  Flexion 50 90  Extension 25, pain anteriorly  25, pain in low back  Right lateral flexion 50, pain anteriorly  75, pain on right  Left lateral flexion 50, pain anteriorly  75, pain on right   (Blank rows = not tested) 07/21/22: standing prolapse assessment demonstrated grade 2 anterior vaginal wall laxity   06/08/22:  External Perineal Exam: WNL                              Internal Pelvic Floor: burning reported with palpation of superficial muscles; deep aching/pressure with palpation of Lt levator ani    Patient confirms identification and approves PT to assess internal pelvic floor and treatment Yes  PELVIC MMT:   MMT eval  Vaginal 3/5  Internal Anal Sphincter 1/5  External Anal Sphincter 2/5  Puborectalis 1/5  Diastasis Recti   (Blank rows = not tested)        TONE: High in Lt levator ani   PROLAPSE: Not able to tell this session due to dyssynergic pelvic floor contraction     06/01/22: COGNITION: Overall cognitive status: Within functional limits for tasks assessed     SENSATION: Light touch: Appears intact Proprioception: Appears intact  GAIT: Comments: decreased hip extension, forward flexed trunk  POSTURE: rounded shoulders, forward head, decreased lumbar lordosis, increased thoracic kyphosis, posterior pelvic tilt, and flexed trunk    LUMBARAROM/PROM:  A/PROM A/PROM  Eval (% available)  Flexion 50  Extension 25, pain anteriorly   Right lateral flexion 50, pain anteriorly   Left lateral flexion 50, pain anteriorly    (Blank rows = not tested)   PALPATION:   General  Significant abdominal restriction and tenderness; decreased rib mobility with mobilization and breathing    TODAY'S TREATMENT:                                                                                                                              DATE:  10/10/23 RE-EVAL Manual: ILU bowel mobilization  Abdominal scar tissue mobilization Ileocecal valve mobilization External rectum mobilization Diaphragm release All techniques performed in supine  Neuromuscular re-education: Bridge + hip adduction 2 x 10 Bridge with 10-Mar-2025x 10 Modified dead bug 10x bil Therapeutic activities: Squats to table 2 x 10 3 way kick 10x each, bil    10/02/23 Manual: ILU bowel mobilization  Abdominal scar tissue mobilization Ileocecal valve mobilization External rectum mobilization Myofascial release to abdomen with push/pull alternating forces horizontally  Diaphragm release All techniques  performed in supine  Neuromuscular re-education: Supine bil shoulder abduction/extension + red band 2 x 10 Supine bent knee fall out + red band 10x bil Bridge with march 10x bil Modified dead bug 10x bil Exercises: Supine alternating shoulder flexion 2 x 10 Lower trunk rotation 2 x 10 Butterfly 10 breaths   09/25/23 Manual: ILU bowel mobilization  Abdominal scar tissue mobilization Ileocecal valve mobilization External rectum mobilization Myofascial release to abdomen with push/pull alternating forces horizontally  Diaphragm release All techniques performed in supine  Neuromuscular re-education: Supine resisted march + red band 2 x 10 Supine resisted hip abduction + red band 2 x 10 Supine resisted unilateral bent knee fall out + red band 10x bil Supine head lifts + bil UE flexion to  60 degrees + 3 lbs 10x Supine full shoulder flexion + 3lbs 10x Exercises: Lower trunk rotation 2 x 10    PATIENT EDUCATION:  Education details: See above Person educated: Patient Education method: Explanation, Demonstration, Tactile cues, Verbal cues, and Handouts Education comprehension: verbalized understanding  HOME EXERCISE PROGRAM: [id number]  ASSESSMENT:  CLINICAL IMPRESSION: Pt doing very well overall wit hgood bowel movements that are feeling more productive. She is improving core strength with better pressure management as well. Her low back A/ROM continues to improve. She still will have abdominal/low back pain increase, but this seems more related to certain foods that orthopedic issues; these episodes are happening less frequently. She has made great improvements in activity level, walking 2.5 hours one day this week without increased pain levels. We did start discharge planning today, wanting to come up with a plan she feels comfortable with to work towards independence with HEP and abdominal mobilization techniques. Good tolerance to all core strengthening today with improved pressure  management and core activation. She is demonstrating improved pelvic stability in all exercises as well. We discussed performing ileocecal valve and rectal mobilizations at home as part of her bowel mobilization program; she was encouraged to have husband perform these techniques at least 1-2x/week if possible. She will continue to benefit from skilled PT intervention in order to decrease pain, improve bowel movements, and promote independence with HEP.     OBJECTIVE IMPAIRMENTS: decreased activity tolerance, decreased coordination, decreased endurance, decreased mobility, decreased strength, increased fascial restrictions, increased muscle spasms, impaired tone, postural dysfunction, and pain.   ACTIVITY LIMITATIONS: lifting, bending, continence, and locomotion level  PARTICIPATION LIMITATIONS: interpersonal relationship, community activity, and occupation  PERSONAL FACTORS: 1 comorbidity: medical history are also affecting patient's functional outcome.   REHAB POTENTIAL: Good  CLINICAL DECISION MAKING: Stable/uncomplicated  EVALUATION COMPLEXITY: Low   GOALS: Goals reviewed with patient? Yes  SHORT TERM GOALS: Updated 10/10/23  Pt will be independent with HEP.   Baseline: Goal status: MET 07/12/22  2.  Pt will be independent with diaphragmatic breathing and down training activities in order to improve pelvic floor relaxation.  Baseline:  Goal status: MET 07/12/22  3.  Pt will be independent with the knack, urge suppression technique, and double voiding in order to improve bladder habits and decrease urinary incontinence.   Baseline:  Goal status: MET 09/22/22  4.  Pt will be independent with use of squatty potty, relaxed toileting mechanics, and improved bowel movement techniques in order to increase ease of bowel movements and complete evacuation.   Baseline:  Goal status: MET 11/09/22  5.  Pt will be able to correctly perform diaphragmatic breathing and appropriate pressure  management in order to prevent worsening vaginal wall laxity and improve pelvic floor A/ROM.   Baseline:  Goal status: MET 01/18/23  LONG TERM GOALS: Updated 10/10/23  Pt will be independent with advanced HEP.   Baseline:  Goal status: IN PROGRESS 10/10/23  2.  Pt will demonstrate normal pelvic floor muscle tone and A/ROM, able to achieve 4/5 strength with contractions and 10 sec endurance, in order to provide appropriate lumbopelvic support in functional activities.   Baseline: not assessed today per patient request - no current bladder issues and they have all resolved Goal status: IN PROGRESS 10/10/23  3.  Pt will increase all impaired lumbar A/ROM by 25% without pain.  Baseline: Pt seen a little bit of loss in some and improvement in extension Goal status:  IN PROGRESS 10/10/23  4.  Pt will report pain no higher than 3/10 with any activity. Baseline: pain currently 3/10, worst is an 8/10 and often corresponds to after she is eating Goal status:  IN PROGRESS 10/10/23  5.  Pt will report 0/10 pain with vaginal penetration in order to improve intimate relationship with partner.    Baseline: no recent intercourse attempts  Goal status:  IN PROGRESS 10/10/23  6.  Pt will be able to go 2-3 hours in between voids without urgency or incontinence in order to improve QOL and perform all functional activities with less difficulty.   Baseline:  Goal status: MET 02/21/23  7.  Pt will report no leaks with laughing, coughing, sneezing in order to improve comfort with interpersonal relationships and community activities.   Baseline: Pt states that she feels 30-40% better Goal status:  MET 10/10/23  8.  Pt will have consistent bowel movements 4-5x/week without straining or pain.  Baseline:  Goal status:  MET 02/21/23  PLAN:  PT FREQUENCY: 1-2x/week  PT DURATION: 6 months  PLANNED INTERVENTIONS: Therapeutic exercises, Therapeutic activity, Neuromuscular re-education, Balance training, Gait  training, Patient/Family education, Self Care, Joint mobilization, Dry Needling, Biofeedback, and Manual therapy  PLAN FOR NEXT SESSION: progress strengthening program; prepare for discharge in the next 6-8 visits   Josette Mares, PT, DPT10/07/259:50 AM

## 2023-10-11 ENCOUNTER — Encounter: Payer: Self-pay | Admitting: Physical Therapy

## 2023-10-11 ENCOUNTER — Ambulatory Visit: Attending: Orthopedic Surgery | Admitting: Physical Therapy

## 2023-10-11 DIAGNOSIS — M62838 Other muscle spasm: Secondary | ICD-10-CM | POA: Diagnosis not present

## 2023-10-11 DIAGNOSIS — M6281 Muscle weakness (generalized): Secondary | ICD-10-CM | POA: Insufficient documentation

## 2023-10-11 DIAGNOSIS — M542 Cervicalgia: Secondary | ICD-10-CM | POA: Insufficient documentation

## 2023-10-11 DIAGNOSIS — M5459 Other low back pain: Secondary | ICD-10-CM | POA: Diagnosis not present

## 2023-10-11 NOTE — Therapy (Signed)
 OUTPATIENT PHYSICAL THERAPY CERVICAL TREATMENT   Patient Name: Karen [name] MRN: [id number] DOB:Mar 19, 1970, 53 y.o., female Today's Date: 10/11/2023  END OF SESSION:  PT End of Session - 10/11/23 1143     Visit Number 11    Date for Recertification  12/26/23    Authorization Type [name]    Authorization Time Period signed dry needling consent form 08/08/23    PT Start Time 1025    PT Stop Time 1103    PT Time Calculation (min) 38 min    Activity Tolerance Patient tolerated treatment well    Behavior During Therapy [facility] for tasks assessed/performed                 Past Medical History:  Diagnosis Date   Adenomyosis    Anemia    Arthritis 2013   L knee gets steroid injections    Arthritis, rheumatic, acute or subacute    Asthmatic bronchitis 01/31/2017   Back pain    Chronic constipation    Diarrhea    DVT of lower extremity (deep venous thrombosis) (HCC)    Dysmenorrhea    Endometriosis    Esophageal stricture    G6PD deficiency    GERD (gastroesophageal reflux disease)    History of kidney stones    History of uterine fibroid    IBS (irritable bowel syndrome)    Interstitial cystitis    Lactose intolerance    Migraine    with aura   Other hemochromatosis 06/10/2021   PONV (postoperative nausea and vomiting)    Recurrent upper respiratory infection (URI)    Sebaceous cyst of breast, right lower inner quadrant 10/25/2012   Excised 11/21/12    Sleep apnea    Syncope, non cardiac    Past Surgical History:  Procedure Laterality Date   ARTHROSCOPIC HAGLUNDS REPAIR     BREAST CYST EXCISION Right 11/21/2012   Procedure: CYST EXCISION BREAST;  Surgeon: Sherlean JINNY Laughter, MD;  Location: [facility];  Service: General;  Laterality: Right;   BREAST CYST EXCISION Bilateral 01/18/2022   Procedure: EXCISION OF SEBACEOUS CYST BILATERAL BREAST;  Surgeon: Vanderbilt Ned, MD;  Location: [facility];  Service: General;   Laterality: Bilateral;   BREAST EXCISIONAL BIOPSY Right 11/2012   CESAREAN SECTION  04/19/1993   CHOLECYSTECTOMY     COLONOSCOPY  05/02/2011   Procedure: COLONOSCOPY;  Surgeon: Lynwood LITTIE Celestia Mickey., MD;  Location: [facility];  Service: Endoscopy;  Laterality: N/A;   colonoscopy  11/04/2014   DENTAL SURGERY  03/06/2018   2 surgeries ( 03/06/2018 and 04/06/2018)   to remove two separate benign tumors   ESOPHAGEAL MANOMETRY N/A 11/04/2015   Procedure: ESOPHAGEAL MANOMETRY (EM);  Surgeon: Lynwood Celestia, MD;  Location: [facility];  Service: Endoscopy;  Laterality: N/A;   ESOPHAGOGASTRODUODENOSCOPY  05/02/2011   Procedure: ESOPHAGOGASTRODUODENOSCOPY (EGD);  Surgeon: Lynwood LITTIE Celestia Mickey., MD;  Location: [facility];  Service: Endoscopy;  Laterality: N/A;   ESOPHAGOGASTRODUODENOSCOPY N/A 09/01/2014   Procedure: ESOPHAGOGASTRODUODENOSCOPY (EGD);  Surgeon: Lynwood Celestia, MD;  Location: [facility];  Service: Endoscopy;  Laterality: N/A;   IR CV LINE INJECTION  09/13/2023   IR IMAGING GUIDED PORT INSERTION  07/06/2023   KNEE SURGERY     LAPAROSCOPY     x 4   LESION REMOVAL N/A 01/18/2022   Procedure: EXCISION OF SEBACEOUS CYSTS CHEST AND NECK;  Surgeon: Vanderbilt Ned, MD;  Location: [facility];  Service: General;  Laterality: N/A;  PH IMPEDANCE STUDY N/A 11/04/2015   Procedure: PH IMPEDANCE STUDY;  Surgeon: Lynwood Bohr, MD;  Location: [facility];  Service: Endoscopy;  Laterality: N/A;   VAGINAL HYSTERECTOMY  2010   TVH--ovaries remain   Patient Active Problem List   Diagnosis Date Noted   Sjogren syndrome with keratoconjunctivitis 12/09/2022   Other hemochromatosis 06/10/2021   Elevated LFTs 04/04/2021   Colitis 04/04/2021   Bacterial overgrowth syndrome 05/21/2020   Obesity 05/21/2020   History of endometriosis 05/21/2020   Constipation due to outlet dysfunction 02/05/2020   Gastroesophageal reflux disease 02/05/2020   Obstructive sleep apnea treated with continuous  positive airway pressure (CPAP) 06/16/2017   Perennial allergic rhinitis with a nonallergic component 01/31/2017   Asthmatic bronchitis 01/31/2017   GI symptoms 01/31/2017   Family history of colon cancer 01/27/2015   Chest pain 12/13/2012   Dyspnea 12/13/2012   DVT of lower extremity (deep venous thrombosis) (HCC) 01/03/1990    PCP: Elliot Charm, MD   REFERRING PROVIDER: Huey Redell Fallow, PA-C  REFERRING DIAG: M54.2 (ICD-10-CM) - Cervicalgia  THERAPY DIAG:  Muscle weakness (generalized)  Other muscle spasm  Other low back pain  Cervicalgia  Rationale for Evaluation and Treatment: Rehabilitation  ONSET DATE: July 2025  SUBJECTIVE:                                                                                                                                                                                                         SUBJECTIVE STATEMENT: Patient reports she is doing okay today. Her stomach is a little upset today. Pain is 2/10.  Saw the neck specialist and felt there is narrowing and spurs.  MRI in 2 weeks.  He told me to avoid looking up.  Saw the rheumatologist and she said the medication would help for RA but not OA. She repots she has a blood clot in port. Went to ED after Labor Day.   Eval: Reports onset of L UT pain following port placement into R shoulder Hand dominance: Right  PERTINENT HISTORY:  07/21/2023 Assessment: - suspect left-sided cervical/ trapezius muscle spasm - reassured by absence of red flag signs/ symptoms at this time   Concurrent lymphedema PAIN: 10/03/23 Are you having pain? Yes: NPRS scale: 6/10 Pain location: L UT Pain description: ache, cramp Aggravating factors: activity Relieving factors: heat, medication  PRECAUTIONS: None  RED FLAGS: None     WEIGHT BEARING RESTRICTIONS: No  FALLS:  Has patient fallen in last 6 months? No  OCCUPATION: [employer] Employee  PLOF: Independent  PATIENT GOALS:  To  manage my neck pain  NEXT MD VISIT: TBD  OBJECTIVE:  Note: Objective measures were completed at Evaluation unless otherwise noted.  DIAGNOSTIC FINDINGS:  none  PATIENT SURVEYS:  NDI: 28/50 56% perceived disability  Minimum Detectable Change (90% confidence): 5 points or 10% points  10/03/23:  NDI  30%   The Patient-Specific Functional Scale  Initial:  I am going to ask you to identify up to 3 important activities that you are unable to do or are having difficulty with as a result of this problem.  Today are there any activities that you are unable to do or having difficulty with because of this?  (Patient shown scale and patient rated each activity)  Follow up: When you first came in you had difficulty performing these activities.  Today do you still have difficulty?  Patient-Specific activity scoring scheme (Point to one number):  0 1 2 3 4 5 6 7 8 9  10 Unable                                                                                                          Able to perform To perform                                                                                                    activity at the same Activity         Level as before                                                                                                                       Injury or problem  Activity       Computer work 1 hour                                     9/30: 5                       2.          Cooking  9/30: 5                                                                   3.         Sit and watch TV                                              9/30: 5                                                                         POSTURE: rounded shoulders and forward head  PALPATION: Global tenderness throughout B UT and posterior shoulder girdle   CERVICAL ROM:   Active ROM A/PROM (deg) eval 9/30  Flexion 75%P! 47   Extension 75%P! 45  Right lateral flexion 75%P! 36  Left lateral flexion 75%P! 29  Right rotation 75%P! 35  Left rotation 75%P! 29   (Blank rows = not tested)  UPPER EXTREMITY ROM:  Active ROM Right eval Left eval 9/30  Shoulder flexion 150 150 L 155  R 151  Shoulder extension     Shoulder abduction 150 150 L 167 R 161  Shoulder adduction     Shoulder extension     Shoulder internal rotation     Shoulder external rotation     Elbow flexion     Elbow extension     Wrist flexion     Wrist extension     Wrist ulnar deviation     Wrist radial deviation     Wrist pronation     Wrist supination      (Blank rows = not tested)  UPPER EXTREMITY MMT:  MMT Right eval Left eval 9/30  Shoulder flexion     Shoulder extension     Shoulder abduction     Shoulder adduction     Shoulder extension     Shoulder internal rotation     Shoulder external rotation     Middle trapezius   Bil 4-/5  Lower trapezius   Bil 4-/5  Elbow flexion     Elbow extension     Wrist flexion     Wrist extension     Wrist ulnar deviation     Wrist radial deviation     Wrist pronation     Wrist supination     Grip strength      (Blank rows = not tested)  CERVICAL SPECIAL TESTS:  Neck flexor muscle endurance test: Negative 9/30 decreased cervical extensor strength 4-/5  FUNCTIONAL TESTS:  N/A  TREATMENT           10/11/2023 Update on status and appointment from neck specialist Manual: seated soft tissue mobilization to right > left cervical paraspinals and suboccipitals; grade 2 cervical distraction 3x 10; avoided right upper trap soft tissue mob secondary to blood clot in right port Seated thoracic extension against small  blue ball (minimal cervical movement ) x 10 then thoracic rotation x 10 each side Seated shoulder abduction against blue loop x 10  Seated shoulder abduction + scap squeeze with blue loop around wrist 2 x 8 Seated thoracic rotation sitting on plinth x 4 each  direction      10/03/2023 NDI Cervical ROM Shoulder ROM PSFS for functional activities as above Prone head lift with bil arm extension 5x Prone head lift/arm T lift  5x Standing median nerve floss 10x right/left review with cues for full elbow extension Verbal review of Standing rows with green band with handles (pt given for home use)   09/27/2023 Discussed findings of her recent consult with the neck specialist and using pain as her guide on movement; also discussed the benefits of movement for nerve health Manual: seated soft tissue mobilization to right > left cervical paraspinals and suboccipitals; grade 2 cervical distraction 3x 10; avoided right upper trap soft tissue mob secondary to blood clot in right port Instruction in seated self traction with fist or towel (Added to HEP- see below)  Standing median nerve floss 10x right/left (Added to HEP- see below) Standing scapular retraction 10x Standing rows with green band with handles (pt given for home use) 10x (Added to HEP- see below) Discussed EMOM (every minute on the minute) with walking for cardio 1 min, then 1 min of nerve floss, then retractions or band rows for 1 min, then rest 1 min for 2-3 rounds           09/19/2023 Manual: seated soft tissue mobilization to bilateral upper trap, levator scap, bilateral cervical paraspinals  Seated shoulder circles x 10 each direction Standing pelvic tilt x 4 (patient braced whole body) Hooklying pelvic tilt x 20 Hooklying bent knee fall out x 10 bilateral  Hooklying TA activation + march x 10 bilateral  Cervical Melt method with foam roll (flexion/extension and rotation) x 20 each direction Chin tucks against foam roll x 12 Seated reclined crunch x 8  Standing TA activation + march x 5 each    09/12/2023 Manual: seated soft tissue mobilization to bilateral upper trap, levator scap, bilateral cervical paraspinals  Upper trap stretch 2 x 20 sec bilateral  Scalene 2 x 20 sec  bilateral  Cervical Melt method with foam roll (flexion/extension and rotation) x 20 each direction Chin tucks against foam roll x 12 Supine scap squeezes x 12 Supine serratus punch x 12 bilateral  Hooklying PPT 2 x 10 Bent knee fall out + TA activation x 10 bilateral  Supine shoulder extension isometric 2 x 10 3 sec hold                                                                                                      PATIENT EDUCATION:  Education details: Discussed eval findings, rehab rationale and POC and patient is in agreement  Person educated: Patient Education method: Explanation and Handouts Education comprehension: verbalized understanding and needs further education  HOME EXERCISE PROGRAM: Access Code: [email or web address] URL: [email or web address] Date: 09/27/2023 Prepared by: Glade Pesa  Exercises -  Seated Shoulder Shrugs  - 2 x daily - 5 x weekly - 2 sets - 10 reps - Seated Scapular Retraction  - 2 x daily - 5 x weekly - 2 sets - 10 reps - Gentle Levator Scapulae Stretch  - 1 x daily - 7 x weekly - 1 sets - 2 reps - 30 seconds hold - Seated Cervical Sidebending Stretch  - 1 x daily - 7 x weekly - 1 sets - 2 reps - 30 seconds hold - Quadricep Stretch with Chair and Counter Support  - 1 x daily - 7 x weekly - 2 sets - 20-30s hold - Standing Hamstring Stretch with Step  - 1 x daily - 7 x weekly - 2 sets - 20-30s hold - Seated Hamstring Stretch  - 1 x daily - 7 x weekly - 2 sets - 20-30 hold - Seated Hip Flexor Stretch  - 1 x daily - 7 x weekly - 2 sets - 20-30 hold - Seated Scalene Stretch with Towel  - 1 x daily - 7 x weekly - 2 sets - 20 reps - Seated Modified Small Range Crunch in Chair  - 1 x daily - 7 x weekly - 1-2 sets - 10 reps - Standing Marching  - 1 x daily - 7 x weekly - 1 sets - 10 reps - Median Nerve Flossing - Tray  - 1 x daily - 7 x weekly - 1 sets - 10 reps - Seated Cervical Traction  - 1 x daily - 7 x weekly - 1 sets - 10 reps - Seated Neck and  Traction  - 1 x daily - 7 x weekly - 1 sets - 10 reps - Standing Low Shoulder Row with Anchored Resistance  - 1 x daily - 7 x weekly - 1 sets - 10 reps ASSESSMENT:  CLINICAL IMPRESSION: Markie presents with minimal pain today. Her MRI for her neck is scheduled for tomorrow. She has been exercising regularly 4-6x a week. Incorporated thoracic mobility exercises and patient verbalized feeling a good stretch and relief after performing exercises. Updated HEP to include exercise progression. Patient continues to respond well to manual technique, cervical and thoracic mobility, and postural strengthening. Patient will benefit from skilled PT to address the below impairments and improve overall function.       OBJECTIVE IMPAIRMENTS: decreased activity tolerance, decreased mobility, decreased ROM, increased fascial restrictions, increased muscle spasms, impaired UE functional use, postural dysfunction, and pain.   ACTIVITY LIMITATIONS: carrying, lifting, bending, bed mobility, and reach over head  PERSONAL FACTORS: Age, Fitness, Past/current experiences, and 1 comorbidity: lymphedema are also affecting patient's functional outcome.   REHAB POTENTIAL: Good  CLINICAL DECISION MAKING: Stable/uncomplicated  EVALUATION COMPLEXITY: Low   GOALS: Goals reviewed with patient? No    SHORT TERM GOALS=LONG TERM GOALS: Target date: 12/26/2023  Patient to demonstrate independence in HEP  Baseline: [id number] Goal status: ongoing  2.  Patient will acknowledge 4/10 pain at least once during episode of care   Baseline: 8/10 Goal status: ongoing  3.  Patient will score at least 20/50 on NDI to signify clinically meaningful improvement in functional abilities.   Baseline: 28/50 Goal status:  met 9/30  4.  160d AROM in B shoulder flexion/abduction Baseline: 150d Goal status: ongoing  5.  Improved cervical rotation ROM to 45 degrees needed for driving revised Goal status:   6. Be able to walk  1 hour or navigate hills at [facility] new  7. PSFS rating for  computer work improved to 6-7 new  8. PSFS rating for cooking improved to 6 new   PLAN:  PT FREQUENCY: 1x/week  PT DURATION: 12 weeks   PLANNED INTERVENTIONS: 97110-Therapeutic exercises, 97530- Therapeutic activity, 97112- Neuromuscular re-education, 97535- Self Care, and [id number]- Manual therapy  PLAN FOR NEXT SESSION  asked about MRI;prone head lifts/UE lifts; try cervical SNAGS with towel; EMOM rounds as above;   postural strengthening; manual techniques as appropriate, aerobic tasks, ROM and flexibility activities  Kristeen Sar, PT 10/11/23 11:46 AM

## 2023-10-12 ENCOUNTER — Encounter (INDEPENDENT_AMBULATORY_CARE_PROVIDER_SITE_OTHER): Payer: Self-pay | Admitting: Otolaryngology

## 2023-10-12 ENCOUNTER — Ambulatory Visit (INDEPENDENT_AMBULATORY_CARE_PROVIDER_SITE_OTHER): Admitting: Otolaryngology

## 2023-10-12 ENCOUNTER — Other Ambulatory Visit (HOSPITAL_COMMUNITY): Payer: Self-pay | Admitting: *Deleted

## 2023-10-12 ENCOUNTER — Telehealth (HOSPITAL_COMMUNITY): Payer: Self-pay | Admitting: *Deleted

## 2023-10-12 VITALS — BP 130/84 | HR 84 | Temp 98.2°F | Ht 67.5 in | Wt 214.0 lb

## 2023-10-12 DIAGNOSIS — K200 Eosinophilic esophagitis: Secondary | ICD-10-CM

## 2023-10-12 DIAGNOSIS — R1310 Dysphagia, unspecified: Secondary | ICD-10-CM | POA: Diagnosis not present

## 2023-10-12 DIAGNOSIS — K219 Gastro-esophageal reflux disease without esophagitis: Secondary | ICD-10-CM | POA: Diagnosis not present

## 2023-10-12 DIAGNOSIS — M542 Cervicalgia: Secondary | ICD-10-CM | POA: Diagnosis not present

## 2023-10-12 NOTE — Progress Notes (Signed)
 ENT CONSULT:  Reason for Consult: dysphagia hx of EoE   HPI: Discussed the use of AI scribe software for clinical note transcription with the patient, who gave verbal consent to proceed.  History of Present Illness Karen [name] is a 53 year old female with hx of eosinophilic esophagitis and rheumatoid arthritis who presents with difficulty swallowing.  She experiences increased episodes of choking with both liquids and solids, occurring a couple of times a week. She describes a sensation of 'choking' and 'strangling' when swallowing, which has become more frequent. She continues to use Flonase  and Prevacid  30 mg once daily for heartburn, initially prescribed for eosinophilic esophagitis. An endoscopy and colonoscopy performed on May 22 showed no signs of eosinophilic esophagitis at that time.  She has a history of rheumatoid arthritis and is currently on Humira  40 mg subcutaneously every other week. She mentions neck pain that started on the left side and is now bilateral. An x-ray at a walk-in clinic revealed bone spurs and narrowing of the vertebrae in the neck. She is scheduled for an MRI today to further evaluate these findings.  She also has hemochromatosis and has a port for treatment. A CT scan of the abdomen and pelvis, performed after an ER visit, showed the presence of hernias, including a paraesophageal hernia and a hiatal hernia, which have developed over the last six months. She reports weight gain and swelling in her stomach.  Records Reviewed:  ED visit 09/04/23 Patient reports that she has a port in place on the right side of her chest. Patient has a port which was placed July 10, 2023. Patient has history of hereditary hemochromatosis and requires frequent phlebotomy. Patient reports after she got labs drawn and the port flushed 3 days ago, she has been having pain and swelling at the base of her neck on the side of the port. She reports she is also had general symptoms of  feeling some exertional dyspnea, fatigue and generalized headache. Patient denies difficulty breathing due to neck swelling but she has objectively noticed some and contacted her doctor regarding this. They had concerns for neck swelling in association with the patient's port and recommended she come to the emergency department for further evaluation.     Past Medical History:  Diagnosis Date   Adenomyosis    Anemia    Arthritis 2013   L knee gets steroid injections    Arthritis, rheumatic, acute or subacute    Asthmatic bronchitis 01/31/2017   Back pain    Chronic constipation    Diarrhea    DVT of lower extremity (deep venous thrombosis) (HCC)    Dysmenorrhea    Endometriosis    Esophageal stricture    G6PD deficiency    GERD (gastroesophageal reflux disease)    History of kidney stones    History of uterine fibroid    IBS (irritable bowel syndrome)    Interstitial cystitis    Lactose intolerance    Migraine    with aura   Other hemochromatosis 06/10/2021   PONV (postoperative nausea and vomiting)    Recurrent upper respiratory infection (URI)    Sebaceous cyst of breast, right lower inner quadrant 10/25/2012   Excised 11/21/12    Sleep apnea    Syncope, non cardiac     Past Surgical History:  Procedure Laterality Date   ARTHROSCOPIC HAGLUNDS REPAIR     BREAST CYST EXCISION Right 11/21/2012   Procedure: CYST EXCISION BREAST;  Surgeon: Sherlean JINNY Laughter, MD;  Location:  [facility];  Service: General;  Laterality: Right;   BREAST CYST EXCISION Bilateral 01/18/2022   Procedure: EXCISION OF SEBACEOUS CYST BILATERAL BREAST;  Surgeon: Vanderbilt Ned, MD;  Location: [facility];  Service: General;  Laterality: Bilateral;   BREAST EXCISIONAL BIOPSY Right 11/2012   CESAREAN SECTION  04/19/1993   CHOLECYSTECTOMY     COLONOSCOPY  05/02/2011   Procedure: COLONOSCOPY;  Surgeon: Lynwood LITTIE Celestia Mickey., MD;  Location: [facility];  Service: Endoscopy;   Laterality: N/A;   colonoscopy  11/04/2014   DENTAL SURGERY  03/06/2018   2 surgeries ( 03/06/2018 and 04/06/2018)   to remove two separate benign tumors   ESOPHAGEAL MANOMETRY N/A 11/04/2015   Procedure: ESOPHAGEAL MANOMETRY (EM);  Surgeon: Lynwood Celestia, MD;  Location: [facility];  Service: Endoscopy;  Laterality: N/A;   ESOPHAGOGASTRODUODENOSCOPY  05/02/2011   Procedure: ESOPHAGOGASTRODUODENOSCOPY (EGD);  Surgeon: Lynwood LITTIE Celestia Mickey., MD;  Location: [facility];  Service: Endoscopy;  Laterality: N/A;   ESOPHAGOGASTRODUODENOSCOPY N/A 09/01/2014   Procedure: ESOPHAGOGASTRODUODENOSCOPY (EGD);  Surgeon: Lynwood Celestia, MD;  Location: [facility];  Service: Endoscopy;  Laterality: N/A;   IR CV LINE INJECTION  09/13/2023   IR IMAGING GUIDED PORT INSERTION  07/06/2023   KNEE SURGERY     LAPAROSCOPY     x 4   LESION REMOVAL N/A 01/18/2022   Procedure: EXCISION OF SEBACEOUS CYSTS CHEST AND NECK;  Surgeon: Vanderbilt Ned, MD;  Location: [facility];  Service: General;  Laterality: N/A;   PH IMPEDANCE STUDY N/A 11/04/2015   Procedure: PH IMPEDANCE STUDY;  Surgeon: Lynwood Celestia, MD;  Location: [facility];  Service: Endoscopy;  Laterality: N/A;   VAGINAL HYSTERECTOMY  2010   TVH--ovaries remain    Family History  Problem Relation Age of Onset   Colon cancer Father 83   Hypertension Father    Stroke Father    Heart disease Father    Heart attack Father    Hyperlipidemia Father    Colon cancer Mother 45   Diabetes Maternal Uncle    Stroke Paternal Grandmother    Heart attack Paternal Grandmother    Heart attack Maternal Grandmother    Heart attack Maternal Grandfather    Cancer Maternal Uncle    Hypertension Sister    Diabetes Brother    Breast cancer Neg Hx     Social History:  reports that she has never smoked. She has never been exposed to tobacco smoke. She has never used smokeless tobacco. She reports that she does not currently use alcohol. She reports that she does  not use drugs.  Allergies:  Allergies  Allergen Reactions   Chlorhexidine  Rash   Molds & Smuts Other (See Comments) and Cough    sneezing   Methotrexate  Nausea And Vomiting    Medications: I have reviewed the patient's current medications.  The PMH, PSH, Medications, Allergies, and SH were reviewed and updated.  ROS: Constitutional: Negative for fever, weight loss and weight gain. Cardiovascular: Negative for chest pain and dyspnea on exertion. Respiratory: Is not experiencing shortness of breath at rest. Gastrointestinal: Negative for nausea and vomiting. Neurological: Negative for headaches. Psychiatric: The patient is not nervous/anxious  Blood pressure 130/84, pulse 84, temperature 98.2 F (36.8 C), height 5' 7.5 (1.715 m), weight 214 lb (97.1 kg), SpO2 94%. Body mass index is 33.02 kg/m.  PHYSICAL EXAM:  Exam: General: Well-developed, well-nourished Respiratory Respiratory effort: Equal inspiration and expiration without stridor Cardiovascular Peripheral Vascular: Warm extremities with equal color/perfusion  Eyes: No nystagmus with equal extraocular motion bilaterally Neuro/Psych/Balance: Patient oriented to person, place, and time; Appropriate mood and affect; Gait is intact with no imbalance; Cranial nerves I-XII are intact Head and Face Inspection: Normocephalic and atraumatic without mass or lesion Palpation: Facial skeleton intact without bony stepoffs Salivary Glands: No mass or tenderness Facial Strength: Facial motility symmetric and full bilaterally ENT Pinna: External ear intact and fully developed External canal: Canal is patent with intact skin Tympanic Membrane: Clear and mobile External Nose: No scar or anatomic deformity Internal Nose: Septum is deviated to the left. No polyp, or purulence. Mucosal edema and erythema present.  Bilateral inferior turbinate hypertrophy.  Lips, Teeth, and gums: Mucosa and teeth intact and viable TMJ: No pain to  palpation with full mobility Oral cavity/oropharynx: No erythema or exudate, no lesions present Nasopharynx: No mass or lesion with intact mucosa Hypopharynx: Intact mucosa without pooling of secretions Larynx Glottic: Full true vocal cord mobility without lesion or mass Supraglottic: Normal appearing epiglottis and AE folds Interarytenoid Space: Moderate pachydermia&edema Subglottic Space: Patent without lesion or edema Neck Neck and Trachea: Midline trachea without mass or lesion Thyroid : No mass or nodularity Lymphatics: No lymphadenopathy  Procedure: Preoperative diagnosis: dysphagia   Postoperative diagnosis:   Same  + GERD LPR  Procedure: Flexible fiberoptic laryngoscopy  Surgeon: Elena Larry, MD  Anesthesia: Topical lidocaine  and Afrin Complications: None Condition is stable throughout exam  Indications and consent:  The patient presents to the clinic with above symptoms. Indirect laryngoscopy view was incomplete. Thus it was recommended that they undergo a flexible fiberoptic laryngoscopy. All of the risks, benefits, and potential complications were reviewed with the patient preoperatively and verbal informed consent was obtained.  Procedure: The patient was seated upright in the clinic. Topical lidocaine  and Afrin were applied to the nasal cavity. After adequate anesthesia had occurred, I then proceeded to pass the flexible telescope into the nasal cavity. The nasal cavity was patent without rhinorrhea or polyp. The nasopharynx was also patent without mass or lesion. The base of tongue was visualized and was normal. There were no signs of pooling of secretions in the piriform sinuses. The true vocal folds were mobile bilaterally. There were no signs of glottic or supraglottic mucosal lesion or mass. There was moderate interarytenoid pachydermia and post cricoid edema. The telescope was then slowly withdrawn and the patient tolerated the procedure throughout.       Studies Reviewed: CT neck 09/04/23 AERODIGESTIVE TRACT: No discrete mass. No edema.   SALIVARY GLANDS: The parotid and submandibular glands are unremarkable.   THYROID : Unremarkable.   LYMPH NODES: No suspicious cervical lymphadenopathy.   SOFT TISSUES: No mass or fluid collection.   BRAIN, ORBITS, SINUSES AND MASTOIDS: No acute abnormality.   LUNGS AND MEDIASTINUM: No acute abnormality.   BONES: No focal bone abnormality.   IMPRESSION: 1. No acute findings or discrete mass in the neck.  Assessment/Plan: Encounter Diagnoses  Name Primary?   Dysphagia, unspecified type Yes   Chronic GERD    Eosinophilic esophagitis     Assessment and Plan Assessment & Plan Dysphagia Increased choking episodes on liquids and solids. MRI scheduled to assess cervical spine for bone spurs or vertebral narrowing. Differential includes esophageal dysmotility or obstruction by osteophytes vs OP/esophageal dysphagia due to other reasons. Scope showed no masses, intact vocal cord movement.  - Order swallow study to evaluate swallowing function and identify oropharyngeal or esophageal issues - Consider referral to general surgeon if hernias are significant, advised with  discuss with primary care physician.  Gastroesophageal reflux disease Managed with Prevacid  30 mg daily. Paraesophageal hernia may contribute to symptoms. - continue Prevacid  at current dose  -  Reflux Gourmet after meals - diet and lifestyle changes to minimize GERD - Refer to [employer] blog for dietary and lifestyle modifications/reflux cook book      Thank you for allowing me to participate in the care of this patient. Please do not hesitate to contact me with any questions or concerns.   Elena Larry, MD Otolaryngology [facility] Phone: [phone] Fax: [phone]    10/12/2023, 9:04 AM

## 2023-10-12 NOTE — Telephone Encounter (Signed)
 Attempted to contact patient to schedule OP MBS. Left VM @ [phone] requesting a call back. RKEEL

## 2023-10-12 NOTE — Patient Instructions (Signed)

## 2023-10-13 ENCOUNTER — Inpatient Hospital Stay

## 2023-10-13 ENCOUNTER — Encounter

## 2023-10-13 VITALS — BP 128/76 | HR 67

## 2023-10-13 DIAGNOSIS — R079 Chest pain, unspecified: Secondary | ICD-10-CM

## 2023-10-13 DIAGNOSIS — E860 Dehydration: Secondary | ICD-10-CM

## 2023-10-13 MED ORDER — METHYLPREDNISOLONE SODIUM SUCC 40 MG IJ SOLR
40.0000 mg | Freq: Once | INTRAMUSCULAR | Status: AC
  Administered 2023-10-13: 40 mg via INTRAVENOUS

## 2023-10-13 MED ORDER — SODIUM CHLORIDE 0.9 % IV SOLN: Freq: Once | INTRAVENOUS | Status: AC

## 2023-10-13 NOTE — Progress Notes (Signed)
 Karen [name] presents today for phlebotomy per MD orders. Phlebotomy procedure started at 1022 and ended at 1046. 500 grams removed. Patient observed for 30 minutes after procedure without any incident. Patient tolerated procedure well. IV needle removed intact.

## 2023-10-13 NOTE — Patient Instructions (Signed)

## 2023-10-13 NOTE — Progress Notes (Signed)
 1115 pt c/o dizziness and chest pain. IVF to gravity, VSS, Sarah, NP at chair side. Continue to evaluate x 30 minutes, recheck VS and if stable w/o chest pain/dizziness ok to D/C. May need to add IVF before or after future phlebotomies  1200 VVS. Pt denies chest pain or dizziness

## 2023-10-16 ENCOUNTER — Ambulatory Visit: Admitting: Occupational Therapy

## 2023-10-16 DIAGNOSIS — I890 Lymphedema, not elsewhere classified: Secondary | ICD-10-CM | POA: Diagnosis not present

## 2023-10-16 NOTE — Therapy (Signed)
 OUTPATIENT OCCUPATIONAL THERAPY TREATMENT NOTE  BLE/ BLQ LYMPHEDEMA  Patient Name: Karen [name] MRN: [id number] DOB:04/23/1970, 53 y.o., female Today's Date: 10/16/2023  REPORTING PERIOD:  END OF SESSION:  OT End of Session - 10/16/23 1235     Visit Number 58    Number of Visits 72    Date for Recertification  11/16/23    OT Start Time 1005    OT Stop Time 1105    OT Time Calculation (min) 60 min    Activity Tolerance Patient tolerated treatment well;No increased pain    Behavior During Therapy [facility] for tasks assessed/performed                Past Medical History:  Diagnosis Date   Adenomyosis    Anemia    Arthritis 2013   L knee gets steroid injections    Arthritis, rheumatic, acute or subacute    Asthmatic bronchitis 01/31/2017   Back pain    Chronic constipation    Diarrhea    DVT of lower extremity (deep venous thrombosis) (HCC)    Dysmenorrhea    Endometriosis    Esophageal stricture    G6PD deficiency    GERD (gastroesophageal reflux disease)    History of kidney stones    History of uterine fibroid    IBS (irritable bowel syndrome)    Interstitial cystitis    Lactose intolerance    Migraine    with aura   Other hemochromatosis 06/10/2021   PONV (postoperative nausea and vomiting)    Recurrent upper respiratory infection (URI)    Sebaceous cyst of breast, right lower inner quadrant 10/25/2012   Excised 11/21/12    Sleep apnea    Syncope, non cardiac    Past Surgical History:  Procedure Laterality Date   ARTHROSCOPIC HAGLUNDS REPAIR     BREAST CYST EXCISION Right 11/21/2012   Procedure: CYST EXCISION BREAST;  Surgeon: Sherlean JINNY Laughter, MD;  Location: [facility];  Service: General;  Laterality: Right;   BREAST CYST EXCISION Bilateral 01/18/2022   Procedure: EXCISION OF SEBACEOUS CYST BILATERAL BREAST;  Surgeon: Vanderbilt Ned, MD;  Location: [facility];  Service: General;  Laterality: Bilateral;   BREAST  EXCISIONAL BIOPSY Right 11/2012   CESAREAN SECTION  04/19/1993   CHOLECYSTECTOMY     COLONOSCOPY  05/02/2011   Procedure: COLONOSCOPY;  Surgeon: Lynwood LITTIE Celestia Mickey., MD;  Location: [facility];  Service: Endoscopy;  Laterality: N/A;   colonoscopy  11/04/2014   DENTAL SURGERY  03/06/2018   2 surgeries ( 03/06/2018 and 04/06/2018)   to remove two separate benign tumors   ESOPHAGEAL MANOMETRY N/A 11/04/2015   Procedure: ESOPHAGEAL MANOMETRY (EM);  Surgeon: Lynwood Celestia, MD;  Location: [facility];  Service: Endoscopy;  Laterality: N/A;   ESOPHAGOGASTRODUODENOSCOPY  05/02/2011   Procedure: ESOPHAGOGASTRODUODENOSCOPY (EGD);  Surgeon: Lynwood LITTIE Celestia Mickey., MD;  Location: [facility];  Service: Endoscopy;  Laterality: N/A;   ESOPHAGOGASTRODUODENOSCOPY N/A 09/01/2014   Procedure: ESOPHAGOGASTRODUODENOSCOPY (EGD);  Surgeon: Lynwood Celestia, MD;  Location: [facility];  Service: Endoscopy;  Laterality: N/A;   IR CV LINE INJECTION  09/13/2023   IR IMAGING GUIDED PORT INSERTION  07/06/2023   KNEE SURGERY     LAPAROSCOPY     x 4   LESION REMOVAL N/A 01/18/2022   Procedure: EXCISION OF SEBACEOUS CYSTS CHEST AND NECK;  Surgeon: Vanderbilt Ned, MD;  Location: [facility];  Service: General;  Laterality: N/A;   PH IMPEDANCE STUDY N/A 11/04/2015  Procedure: PH IMPEDANCE STUDY;  Surgeon: Lynwood Bohr, MD;  Location: [facility];  Service: Endoscopy;  Laterality: N/A;   VAGINAL HYSTERECTOMY  2010   TVH--ovaries remain   Patient Active Problem List   Diagnosis Date Noted   Sjogren syndrome with keratoconjunctivitis 12/09/2022   Other hemochromatosis 06/10/2021   Elevated LFTs 04/04/2021   Colitis 04/04/2021   Bacterial overgrowth syndrome 05/21/2020   Obesity 05/21/2020   History of endometriosis 05/21/2020   Constipation due to outlet dysfunction 02/05/2020   Gastroesophageal reflux disease 02/05/2020   Obstructive sleep apnea treated with continuous positive airway pressure (CPAP)  06/16/2017   Perennial allergic rhinitis with a nonallergic component 01/31/2017   Asthmatic bronchitis 01/31/2017   GI symptoms 01/31/2017   Family history of colon cancer 01/27/2015   Chest pain 12/13/2012   Dyspnea 12/13/2012   DVT of lower extremity (deep venous thrombosis) (HCC) 01/03/1990    PCP: Gaile New, MD  REFERRING PROVIDER: Valery Ripple, MD  REFERRING DIAG: I89.0  THERAPY DIAG:  Lymphedema, not elsewhere classified  Rationale for Evaluation and Treatment: Rehabilitation  ONSET DATE: ~2014; exacerbation in 2023  SUBJECTIVE:                                                                                                                                                                                          SUBJECTIVE STATEMENT:  Pt presents for OT to address lower quadrant, including deep abdominal lymphatics, and LLE lymphedema w/ associated pain. Pt denies LE related pain today. Pt reports she experienced light headedness and chest pain during her infusion this past Friday. She denies these symptoms, as well as SOB since last Friday, and denies today throughout our session.   PERTINENT HISTORY: CVI, OSA (no CPAP), GERD, Esophageal stricture, IBS, Lactose intolerant, Diarrhea, Hx LE DVT, recent RA dx  PAIN:  Are you having pain? Yes: 0/10 Pain location: abdomen; digestive discomfort Pain description: abdominal fullness, tightness Aggravating factors: standing, walking,  Relieving factors: MLD, Miralax ,   PRECAUTIONS: Other: LYMPHEDEMA; Hx LLE DVT  WEIGHT BEARING RESTRICTIONS: No  FALLS:  Has patient fallen in last 6 months? No  LIVING ENVIRONMENT: Lives with: lives with spouse and daughter Lives in: House/apartment Stairs: Yes; Internal: 14 steps; yes and External: 4 steps; yes Has following equipment at home: None  OCCUPATION: [employer] operational officer, walking, desk work  LEISURE: family time  HAND DOMINANCE: right   PRIOR LEVEL OF  FUNCTION: Independent  PATIENT GOALS: Feel better, be able to be more active without pain, wear preferred street shoes. NEW 08/03/23: Be able to manage digestive discomfort and gut inflammation symptoms without medications  OBJECTIVE: Note: Objective  measures were completed at Evaluation unless otherwise noted.  COGNITION:  Overall cognitive status: Within functional limits for tasks assessed   POSTURE: WFL  LE ROM: WFL  LE MMT: WFL  LYMPHEDEMA ASSESSMENTS: non-Ca related  INFECTIONS: denies cellulitis and wound hx  BLE COMPARATIVE LIMB VOLUMETRICS Initial 11/15/22  LANDMARK RIGHT (dominant)  R LEG (A-D) 3028.9 ml  R THIGH (E-G) 4809.60 ml  R FULL LIMB (A-G) 7838.5 ml  Limb Volume differential (LVD)  %  Volume change since initial %  Volume change overall V  (Blank rows = not tested)  LANDMARK LEFT  (Rx)  L LEG (A-D) 3189.9 ml  L THIGH (E-G) 4959.8 ml  L FULL LIMB (A-G) 8149.7 ml  Limb Volume differential (LVD)  LEG LVD = 5.32%, L>R; THIGH LVD = 3.12%, L>R; And full LLE LVD = 3.97%, L>R.   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  Skin  Description Hyper-Keratosis Peau d' Orange Shiny Tight Fibrotic/ Indurated Fatty Doughy Spongy/ boggy       R>L x  x   Skin dry Flaky WNL Macerated   mildly      Color Redness Varicosities Blanching Hemosiderin Stain Mottled        x   Odor Malodorous Yeast Fungal infection  WNL      x   Temperature Warm Cool wnl     x    Pitting Edema   1+ 2+ 3+ 4+ Non-pitting         x   Girth Symmetrical Asymmetrical                    Distribution    L>R toes to groin    Stemmer Sign Positive Negative   +L -R   Lymphorrhea History Of:  Present Absent     x    Wounds History Of Present Absent Venous Arterial Pressure Sheer     x        Signs of Infection Redness Warmth Erythema Acute Swelling Drainage Borders                    Sensation Light Touch Deep pressure Hypersensitivity   In tact Impaired In tact Impaired Absent Impaired   x  x  x     Nails WNL   Fungus nail dystrophy   x     Hair Growth Symmetrical Asymmetrical   x    Skin Creases Base of toes  Ankles   Base of Fingers knees       Abdominal pannus Thigh Lobules  Face/neck   x           GAIT: Distance walked: >500' Assistive device utilized: None Level of assistance: Complete Independence Comments: Functional ambulation and transfers [facility]  LYMPHEDEMA LIFE IMPACT SCALE (LLIS): Initial 11/15/22  50%  FOTO functional outcome measure: Deferred. FOTO discontinued.  TODAY'S TREATMENT:  MLD to abdominal lymphatics Pt edu for LE self care  PATIENT EDUCATION:  Continued Pt/ CG edu for how function of cisterna chyli, part of the thoracic duct of deep abdominal lymphatics, assists with digestion by filtering many of the fats and proteins from the digestive system. Sub optimal lymphatic flow in this region may result in constipation, bloating, swelling, etc. MLD helps mobilize these protein and fats , and the rhythmic movement of manual lymphatic drainage stimulates digestive muscles and increase peristalsis. Provided printed resource for reference.  Topics include outcome of comparative limb volumetrics- starting limb volume differentials (LVDs), technology and gradient techniques used for short stretch, multilayer compression wrapping, simple self-MLD, therapeutic lymphatic pumping exercises, skin/nail care,  LE precautions,. compression garment recommendations and specifications, wear and care schedule and compression garment donning / doffing w assistive devices. Discussed progress towards all OT goals since commencing CDT. All questions answered to the Pt's satisfaction. Good return. Person educated: Patient  Education method: Explanation, Demonstration, and Handouts Education comprehension: verbalized understanding, returned demonstration, verbal cues required, and needs further education  HOME EXERCISE PROGRAM: BLE lymphatic pumping there ex using- 1 sets of 10 reps, each exercise in order-  1-2 x daily, bilaterally Simple self MLD 1 x daily Daily skin care to increase hydration, skin mobility and decrease infection risk- can be done during MLD Compression wraps 23/7 during intensive phase of CDT Compression garments/ devices during self-management phase of CDT  ASSESSMENT:  CLINICAL IMPRESSION: Resumed short neck sequence since port tenderness and R lateral neck swelling is resolved this visit. No SOB, dizziness, or increased pain observed or reported during session. Pt continues to have digestive discomfort associated with chronic constipation alternating with diarrhea.  Pt tolerated the abdominal sequence as established without increased pain. Pt continues to  benefit from MLD for enhancing gut motility and bowel function.  MLD is also known to facilitate the removal of inflammatory substances and toxins from the intestinal area, which may help reduce inflammation linked to digestive disorders. Cont  1 x weekly as per POC.  11/15/22 Initial Evaluation: Karen [name] is a 53 y.o. female presenting with very mild, stage II, LLE lymphedema 2/2 venous insufficiency and obesity. L Leg swelling and associated pain swelling fluctuates. It has progressed over time, and now no longer resolves with elevation over night. RLE swelling limits balance during functional ambulation. It exacerbates infection  and falls risk. LLE/RLQ lymphedema interferes with functional performance in all occupational domains, including basic and instrumental ADLs, productive activities, leisure pursuits and social participation. Pt will benefit from Occupational Therapy for Complete Decongestive Therapy (CDT) to restore function, to reduce physical and psychologic suffering associated with chronic, progressive lymphedema and associated pain, and to limit infection. CDT will include manual lymphatic drainage (MLD), skin care, therapeutic exercise and compression wraps and garment's devices. Without skilled OT for lymphedema care the condition will worsen and further functional decline is expected  Custom-made gradient compression garments and HOS devices are medically necessary because they are uniquely sized and shaped to fit the exact dimensions of the affected extremities, and to provide appropriate medical grade, graduated compression essential for optimally managing chronic, progressive lymphedema. Multiple custom compression garments are needed to ensure proper hygiene to limit infection risk. Custom compression garments should be replaced q 3-6 months When worn consistently for optimal lipo-lymphedema self-management over time. HOS devices, medically necessary to limit fibrosis buildup in tissue, should be replaced q 2 years and PRN when worn out.      OBJECTIVE IMPAIRMENTS:  decreased activity tolerance, decreased knowledge of condition, decreased knowledge of use of DME, increased edema, impaired sensation, pain, and chronic, progressive leg swelling.   ACTIVITY LIMITATIONS: dependent sitting, extended standing and/ or walking, squatting, and lower body dressing, fitting preferred street shoes  PARTICIPATION LIMITATIONS: shopping, community activity, occupation, and yard work  PERSONAL FACTORS: 3+ comorbidities: Hx DVT,  OSA, CVI. Varicose veins are also affecting patient's functional outcome.   REHAB POTENTIAL:  Good  EVALUATION COMPLEXITY: Moderate   GOALS: Goals reviewed with patient? Yes  SHORT TERM GOALS: Target date: 4th OT Rx visit  SHORT TERM GOALS: Target date: 4th OT Rx visit   Pt will demonstrate understanding of lymphedema precautions and prevention strategies with modified independence using a printed reference to identify at least 5 precautions and discussing how s/he may implement them into daily life to reduce risk of progression with extra time. Baseline:Max A Goal status: GOAL MET   Pt will be able to apply multilayer, knee length, gradient, compression wraps to one leg at a time with modified assistance (extra time and assistive device/s) to decrease limb volume, to limit infection risk, and to limit lymphedema progression.  Baseline: Dependent Goal status: GOAL DISCHARGED. Pt Going to compression garments instead  LONG TERM GOALS: Target date: 02/08/23  Given this patient's Intake score of 67%/100% on the functional outcomes FOTO tool, patient will experience an increase in function of 5 points to improve basic and instrumental ADLs performance, including lymphedema self-care.  Baseline: Max A Goal status:GOAL DISCHARGED.  FOTO TOOL DISCONTINUED AT CLINIC   Given this patient's Intake score of 50% on the Lymphedema Life Impact Scale (LLIS), patient will experience a reduction of at least 5 points in her perceived level of functional impairment resulting from lymphedema to improve functional performance and quality of life (QOL). Baseline: 50% Goal status: PROGRESSING  Pt will achieve at least a 10% volume reduction in B legs to return limb to typical size and shape, to limit infection risk and LE progression, to decrease pain, to improve function. Baseline: Dependent Goal status: PROGRESSING  4.  Pt will obtain proper compression garments/devices and achieve modified independence (extra time + assistive devices) with donning/doffing to optimize limb volume reductions and  limit LE  progression over time. Baseline:  Goal status:GOAL MET  5.  During Intensive phase CDT , with modified independence, Pt will achieve at least 85% compliance with all lymphedema self-care home program components, including daily skin care, compression wraps and /or garments, simple self MLD and lymphatic pumping therex to habituate LE self care protocol  into ADLs for optimal LE self-management over time. Baseline: Dependent Goal status:GOAL MET   PLAN:  OT FREQUENCY: 1 /week  OT DURATION: 12 weeks and PRN  PLANNED INTERVENTIONS: 97110-Therapeutic exercises, 97530- Therapeutic activity, 97535- Self Care, [id number]- Manual therapy, Patient/Family education, Manual lymph drainage, Compression bandaging, and fit with appropriate compression garments  PLAN FOR NEXT SESSION:  Cont MLD as established Pt and family edu for LE self-care  Zebedee Dec, MS, OTR/L, CLT-LANA 10/16/23 12:39 PM

## 2023-10-17 ENCOUNTER — Encounter: Payer: Self-pay | Admitting: Physical Therapy

## 2023-10-17 ENCOUNTER — Ambulatory Visit: Admitting: Physical Therapy

## 2023-10-17 DIAGNOSIS — M5459 Other low back pain: Secondary | ICD-10-CM | POA: Diagnosis not present

## 2023-10-17 DIAGNOSIS — M62838 Other muscle spasm: Secondary | ICD-10-CM

## 2023-10-17 DIAGNOSIS — M542 Cervicalgia: Secondary | ICD-10-CM

## 2023-10-17 DIAGNOSIS — M6281 Muscle weakness (generalized): Secondary | ICD-10-CM | POA: Diagnosis not present

## 2023-10-17 NOTE — Therapy (Signed)
 OUTPATIENT PHYSICAL THERAPY CERVICAL TREATMENT   Patient Name: Karen [name] MRN: [id number] DOB:13-Aug-1970, 53 y.o., female Today's Date: 10/17/2023  END OF SESSION:  PT End of Session - 10/17/23 1113     Visit Number 12    Date for Recertification  12/26/23    Authorization Type [name]    Authorization Time Period signed dry needling consent form 08/08/23    PT Start Time 1017    PT Stop Time 1056    PT Time Calculation (min) 39 min    Activity Tolerance Patient tolerated treatment well;Patient limited by pain    Behavior During Therapy [facility] for tasks assessed/performed                  Past Medical History:  Diagnosis Date   Adenomyosis    Anemia    Arthritis 2013   L knee gets steroid injections    Arthritis, rheumatic, acute or subacute    Asthmatic bronchitis 01/31/2017   Back pain    Chronic constipation    Diarrhea    DVT of lower extremity (deep venous thrombosis) (HCC)    Dysmenorrhea    Endometriosis    Esophageal stricture    G6PD deficiency    GERD (gastroesophageal reflux disease)    History of kidney stones    History of uterine fibroid    IBS (irritable bowel syndrome)    Interstitial cystitis    Lactose intolerance    Migraine    with aura   Other hemochromatosis 06/10/2021   PONV (postoperative nausea and vomiting)    Recurrent upper respiratory infection (URI)    Sebaceous cyst of breast, right lower inner quadrant 10/25/2012   Excised 11/21/12    Sleep apnea    Syncope, non cardiac    Past Surgical History:  Procedure Laterality Date   ARTHROSCOPIC HAGLUNDS REPAIR     BREAST CYST EXCISION Right 11/21/2012   Procedure: CYST EXCISION BREAST;  Surgeon: Sherlean JINNY Laughter, MD;  Location: [facility];  Service: General;  Laterality: Right;   BREAST CYST EXCISION Bilateral 01/18/2022   Procedure: EXCISION OF SEBACEOUS CYST BILATERAL BREAST;  Surgeon: Vanderbilt Ned, MD;  Location: [facility];  Service: General;  Laterality: Bilateral;   BREAST EXCISIONAL BIOPSY Right 11/2012   CESAREAN SECTION  04/19/1993   CHOLECYSTECTOMY     COLONOSCOPY  05/02/2011   Procedure: COLONOSCOPY;  Surgeon: Lynwood LITTIE Celestia Mickey., MD;  Location: [facility];  Service: Endoscopy;  Laterality: N/A;   colonoscopy  11/04/2014   DENTAL SURGERY  03/06/2018   2 surgeries ( 03/06/2018 and 04/06/2018)   to remove two separate benign tumors   ESOPHAGEAL MANOMETRY N/A 11/04/2015   Procedure: ESOPHAGEAL MANOMETRY (EM);  Surgeon: Lynwood Celestia, MD;  Location: [facility];  Service: Endoscopy;  Laterality: N/A;   ESOPHAGOGASTRODUODENOSCOPY  05/02/2011   Procedure: ESOPHAGOGASTRODUODENOSCOPY (EGD);  Surgeon: Lynwood LITTIE Celestia Mickey., MD;  Location: [facility];  Service: Endoscopy;  Laterality: N/A;   ESOPHAGOGASTRODUODENOSCOPY N/A 09/01/2014   Procedure: ESOPHAGOGASTRODUODENOSCOPY (EGD);  Surgeon: Lynwood Celestia, MD;  Location: [facility];  Service: Endoscopy;  Laterality: N/A;   IR CV LINE INJECTION  09/13/2023   IR IMAGING GUIDED PORT INSERTION  07/06/2023   KNEE SURGERY     LAPAROSCOPY     x 4   LESION REMOVAL N/A 01/18/2022   Procedure: EXCISION OF SEBACEOUS CYSTS CHEST AND NECK;  Surgeon: Vanderbilt Ned, MD;  Location: [facility];  Service: General;  Laterality: N/A;   PH IMPEDANCE STUDY N/A 11/04/2015   Procedure: PH IMPEDANCE STUDY;  Surgeon: Lynwood Bohr, MD;  Location: [facility];  Service: Endoscopy;  Laterality: N/A;   VAGINAL HYSTERECTOMY  2010   TVH--ovaries remain   Patient Active Problem List   Diagnosis Date Noted   Sjogren syndrome with keratoconjunctivitis 12/09/2022   Other hemochromatosis 06/10/2021   Elevated LFTs 04/04/2021   Colitis 04/04/2021   Bacterial overgrowth syndrome 05/21/2020   Obesity 05/21/2020   History of endometriosis 05/21/2020   Constipation due to outlet dysfunction 02/05/2020   Gastroesophageal reflux disease 02/05/2020   Obstructive sleep apnea  treated with continuous positive airway pressure (CPAP) 06/16/2017   Perennial allergic rhinitis with a nonallergic component 01/31/2017   Asthmatic bronchitis 01/31/2017   GI symptoms 01/31/2017   Family history of colon cancer 01/27/2015   Chest pain 12/13/2012   Dyspnea 12/13/2012   DVT of lower extremity (deep venous thrombosis) (HCC) 01/03/1990    PCP: Elliot Charm, MD   REFERRING PROVIDER: Huey Redell Fallow, PA-C  REFERRING DIAG: M54.2 (ICD-10-CM) - Cervicalgia  THERAPY DIAG:  Muscle weakness (generalized)  Other muscle spasm  Other low back pain  Cervicalgia  Rationale for Evaluation and Treatment: Rehabilitation  ONSET DATE: July 2025  SUBJECTIVE:                                                                                                                                                                                                         SUBJECTIVE STATEMENT: Patient reports she is doing okay today. She goes to the MD on Friday for him to read her MRI results. Neck pain is 3/10.  Saw the neck specialist and felt there is narrowing and spurs.  MRI in 2 weeks.  He told me to avoid looking up.  Saw the rheumatologist and she said the medication would help for RA but not OA. She repots she has a blood clot in port. Went to ED after Labor Day.   Eval: Reports onset of L UT pain following port placement into R shoulder Hand dominance: Right  PERTINENT HISTORY:  07/21/2023 Assessment: - suspect left-sided cervical/ trapezius muscle spasm - reassured by absence of red flag signs/ symptoms at this time   Concurrent lymphedema PAIN: 10/03/23 Are you having pain? Yes: NPRS scale: 6/10 Pain location: L UT Pain description: ache, cramp Aggravating factors: activity Relieving factors: heat, medication  PRECAUTIONS: None  RED FLAGS: None     WEIGHT BEARING RESTRICTIONS: No  FALLS:  Has patient fallen in last 6 months? No  OCCUPATION: [name] Employee  PLOF: Independent  PATIENT GOALS: To manage my neck pain  NEXT MD VISIT: TBD  OBJECTIVE:  Note: Objective measures were completed at Evaluation unless otherwise noted.  DIAGNOSTIC FINDINGS:  none  PATIENT SURVEYS:  NDI: 28/50 56% perceived disability  Minimum Detectable Change (90% confidence): 5 points or 10% points  10/03/23:  NDI  30%   The Patient-Specific Functional Scale  Initial:  I am going to ask you to identify up to 3 important activities that you are unable to do or are having difficulty with as a result of this problem.  Today are there any activities that you are unable to do or having difficulty with because of this?  (Patient shown scale and patient rated each activity)  Follow up: When you first came in you had difficulty performing these activities.  Today do you still have difficulty?  Patient-Specific activity scoring scheme (Point to one number):  0 1 2 3 4 5 6 7 8 9  10 Unable                                                                                                          Able to perform To perform                                                                                                    activity at the same Activity         Level as before                                                                                                                       Injury or problem  Activity       Computer work 1 hour                                     9/30: 5                       2.  Cooking                                                           9/30: 5                                                                   3.         Sit and watch TV                                              9/30: 5                                                                         POSTURE: rounded shoulders and forward head  PALPATION: Global tenderness throughout B UT and posterior shoulder girdle   CERVICAL ROM:    Active ROM A/PROM (deg) eval 9/30  Flexion 75%P! 47  Extension 75%P! 45  Right lateral flexion 75%P! 36  Left lateral flexion 75%P! 29  Right rotation 75%P! 35  Left rotation 75%P! 29   (Blank rows = not tested)  UPPER EXTREMITY ROM:  Active ROM Right eval Left eval 9/30  Shoulder flexion 150 150 L 155  R 151  Shoulder extension     Shoulder abduction 150 150 L 167 R 161  Shoulder adduction     Shoulder extension     Shoulder internal rotation     Shoulder external rotation     Elbow flexion     Elbow extension     Wrist flexion     Wrist extension     Wrist ulnar deviation     Wrist radial deviation     Wrist pronation     Wrist supination      (Blank rows = not tested)  UPPER EXTREMITY MMT:  MMT Right eval Left eval 9/30  Shoulder flexion     Shoulder extension     Shoulder abduction     Shoulder adduction     Shoulder extension     Shoulder internal rotation     Shoulder external rotation     Middle trapezius   Bil 4-/5  Lower trapezius   Bil 4-/5  Elbow flexion     Elbow extension     Wrist flexion     Wrist extension     Wrist ulnar deviation     Wrist radial deviation     Wrist pronation     Wrist supination     Grip strength      (Blank rows = not tested)  CERVICAL SPECIAL TESTS:  Neck flexor muscle endurance test: Negative 9/30 decreased cervical extensor strength 4-/5  FUNCTIONAL TESTS:  N/A  TREATMENT           10/17/2023 Manual: seated soft tissue mobilization to right > left cervical paraspinals and suboccipitals; grade 2 cervical distraction 3x 10; avoided right upper trap soft tissue mob secondary to blood clot in right port Attempted thoracic extension exercises against small ball put patient had increased abdominal pain that radiated into her back Seated TA activation + ball squeeze x 20  Seated scap squeeze with light band around wrist 2 x 8  Seated shoulder abduction against light band + shoulder flexion 2 x 8 Chin tuck 2 x  10 Seated shoulder ER with light loop 2 x 8  Standing QL stretch x 8 each side Attempted foam roll roll up at [facility] but this was painful for patient's side Foam roll along patient's spine at wall + snow angel x 8 Seated scalene  stretch 2 x 20sec bilateral     10/11/2023 Update on status and appointment from neck specialist Manual: seated soft tissue mobilization to right > left cervical paraspinals and suboccipitals; grade 2 cervical distraction 3x 10; avoided right upper trap soft tissue mob secondary to blood clot in right port Seated thoracic extension against small blue ball (minimal cervical movement ) x 10 then thoracic rotation x 10 each side Seated shoulder abduction against blue loop x 10  Seated shoulder abduction + scap squeeze with blue loop around wrist 2 x 8 Seated thoracic rotation sitting on plinth x 4 each direction      10/03/2023 NDI Cervical ROM Shoulder ROM PSFS for functional activities as above Prone head lift with bil arm extension 5x Prone head lift/arm T lift  5x Standing median nerve floss 10x right/left review with cues for full elbow extension Verbal review of Standing rows with green band with handles (pt given for home use)                                                                                                           PATIENT EDUCATION:  Education details: Discussed eval findings, rehab rationale and POC and patient is in agreement  Person educated: Patient Education method: Explanation and Handouts Education comprehension: verbalized understanding and needs further education  HOME EXERCISE PROGRAM: Access Code: [email or web address] URL: [email or web address] Date: 09/27/2023 Prepared by: Glade Pesa  Exercises - Seated Shoulder Shrugs  - 2 x daily - 5 x weekly - 2 sets - 10 reps - Seated Scapular Retraction  - 2 x daily - 5 x weekly - 2 sets - 10 reps - Gentle Levator Scapulae Stretch  - 1 x daily - 7 x weekly - 1 sets - 2  reps - 30 seconds hold - Seated Cervical Sidebending Stretch  - 1 x daily - 7 x weekly - 1 sets - 2 reps - 30 seconds hold - Quadricep Stretch with Chair and Counter Support  - 1 x daily - 7 x weekly - 2 sets - 20-30s hold - Standing Hamstring Stretch with Step  - 1 x daily - 7 x weekly - 2 sets - 20-30s hold -  Seated Hamstring Stretch  - 1 x daily - 7 x weekly - 2 sets - 20-30 hold - Seated Hip Flexor Stretch  - 1 x daily - 7 x weekly - 2 sets - 20-30 hold - Seated Scalene Stretch with Towel  - 1 x daily - 7 x weekly - 2 sets - 20 reps - Seated Modified Small Range Crunch in Chair  - 1 x daily - 7 x weekly - 1-2 sets - 10 reps - Standing Marching  - 1 x daily - 7 x weekly - 1 sets - 10 reps - Median Nerve Flossing - Tray  - 1 x daily - 7 x weekly - 1 sets - 10 reps - Seated Cervical Traction  - 1 x daily - 7 x weekly - 1 sets - 10 reps - Seated Neck and Traction  - 1 x daily - 7 x weekly - 1 sets - 10 reps - Standing Low Shoulder Row with Anchored Resistance  - 1 x daily - 7 x weekly - 1 sets - 10 reps ASSESSMENT:  CLINICAL IMPRESSION: Tamura presents with increased abdominal pain today that is radiating into her back. This pain started over the weekend and it has been progressively getting worse. She described it as a cutting pain. There ability to perform thoracic mobility exercises was limited because of this. Treatment session focused on periscapular strengthening and posture. Exercise intensity was lower today due to patient's pain. PT monitored patient throughout and provided modifications as needed. Patient will benefit from skilled PT to address the below impairments and improve overall function.    OBJECTIVE IMPAIRMENTS: decreased activity tolerance, decreased mobility, decreased ROM, increased fascial restrictions, increased muscle spasms, impaired UE functional use, postural dysfunction, and pain.   ACTIVITY LIMITATIONS: carrying, lifting, bending, bed mobility, and reach over  head  PERSONAL FACTORS: Age, Fitness, Past/current experiences, and 1 comorbidity: lymphedema are also affecting patient's functional outcome.   REHAB POTENTIAL: Good  CLINICAL DECISION MAKING: Stable/uncomplicated  EVALUATION COMPLEXITY: Low   GOALS: Goals reviewed with patient? No    SHORT TERM GOALS=LONG TERM GOALS: Target date: 12/26/2023  Patient to demonstrate independence in HEP  Baseline: [email or web address] Goal status: ongoing  2.  Patient will acknowledge 4/10 pain at least once during episode of care   Baseline: 8/10 Goal status: ongoing  3.  Patient will score at least 20/50 on NDI to signify clinically meaningful improvement in functional abilities.   Baseline: 28/50 Goal status:  met 9/30  4.  160d AROM in B shoulder flexion/abduction Baseline: 150d Goal status: ongoing  5.  Improved cervical rotation ROM to 45 degrees needed for driving revised Goal status:   6. Be able to walk 1 hour or navigate hills at [facility] new  7. PSFS rating for computer work improved to 6-7 new  8. PSFS rating for cooking improved to 6 new   PLAN:  PT FREQUENCY: 1x/week  PT DURATION: 12 weeks   PLANNED INTERVENTIONS: 97110-Therapeutic exercises, 97530- Therapeutic activity, 97112- Neuromuscular re-education, 97535- Self Care, and [id number]- Manual therapy  PLAN FOR NEXT SESSION  asked about MRI & abdominal pain;prone head lifts/UE lifts; try cervical SNAGS with towel; EMOM rounds as above;   postural strengthening; manual techniques as appropriate, aerobic tasks, ROM and flexibility activities  Kristeen Sar, PT 10/17/23 11:16 AM

## 2023-10-18 ENCOUNTER — Ambulatory Visit: Payer: Self-pay

## 2023-10-18 DIAGNOSIS — M62838 Other muscle spasm: Secondary | ICD-10-CM | POA: Diagnosis not present

## 2023-10-18 DIAGNOSIS — M6281 Muscle weakness (generalized): Secondary | ICD-10-CM

## 2023-10-18 DIAGNOSIS — M25631 Stiffness of right wrist, not elsewhere classified: Secondary | ICD-10-CM | POA: Diagnosis not present

## 2023-10-18 DIAGNOSIS — R279 Unspecified lack of coordination: Secondary | ICD-10-CM | POA: Diagnosis not present

## 2023-10-18 DIAGNOSIS — R102 Pelvic and perineal pain unspecified side: Secondary | ICD-10-CM | POA: Diagnosis not present

## 2023-10-18 DIAGNOSIS — M5459 Other low back pain: Secondary | ICD-10-CM | POA: Diagnosis not present

## 2023-10-18 NOTE — Therapy (Signed)
 OUTPATIENT PHYSICAL THERAPY FEMALE PELVIC TREATMENT     Patient Name: Karen [name] MRN: [id number] DOB:1970/09/22, 53 y.o., female Today's Date: 10/18/2023  END OF SESSION:  PT End of Session - 10/18/23 1233     Visit Number 53    Date for Recertification  12/26/23    Authorization Type [name]    Authorization Time Period signed dry needling consent form 08/08/23    PT Start Time 1233    PT Stop Time 1311    PT Time Calculation (min) 38 min    Activity Tolerance Patient tolerated treatment well    Behavior During Therapy [facility] for tasks assessed/performed                      Past Medical History:  Diagnosis Date   Adenomyosis    Anemia    Arthritis 2013   L knee gets steroid injections    Arthritis, rheumatic, acute or subacute    Asthmatic bronchitis 01/31/2017   Back pain    Chronic constipation    Diarrhea    DVT of lower extremity (deep venous thrombosis) (HCC)    Dysmenorrhea    Endometriosis    Esophageal stricture    G6PD deficiency    GERD (gastroesophageal reflux disease)    History of kidney stones    History of uterine fibroid    IBS (irritable bowel syndrome)    Interstitial cystitis    Lactose intolerance    Migraine    with aura   Other hemochromatosis 06/10/2021   PONV (postoperative nausea and vomiting)    Recurrent upper respiratory infection (URI)    Sebaceous cyst of breast, right lower inner quadrant 10/25/2012   Excised 11/21/12    Sleep apnea    Syncope, non cardiac    Past Surgical History:  Procedure Laterality Date   ARTHROSCOPIC HAGLUNDS REPAIR     BREAST CYST EXCISION Right 11/21/2012   Procedure: CYST EXCISION BREAST;  Surgeon: Sherlean JINNY Laughter, MD;  Location: [facility];  Service: General;  Laterality: Right;   BREAST CYST EXCISION Bilateral 01/18/2022   Procedure: EXCISION OF SEBACEOUS CYST BILATERAL BREAST;  Surgeon: Vanderbilt Ned, MD;  Location: [facility];   Service: General;  Laterality: Bilateral;   BREAST EXCISIONAL BIOPSY Right 11/2012   CESAREAN SECTION  04/19/1993   CHOLECYSTECTOMY     COLONOSCOPY  05/02/2011   Procedure: COLONOSCOPY;  Surgeon: Lynwood LITTIE Celestia Mickey., MD;  Location: [facility];  Service: Endoscopy;  Laterality: N/A;   colonoscopy  11/04/2014   DENTAL SURGERY  03/06/2018   2 surgeries ( 03/06/2018 and 04/06/2018)   to remove two separate benign tumors   ESOPHAGEAL MANOMETRY N/A 11/04/2015   Procedure: ESOPHAGEAL MANOMETRY (EM);  Surgeon: Lynwood Celestia, MD;  Location: [facility];  Service: Endoscopy;  Laterality: N/A;   ESOPHAGOGASTRODUODENOSCOPY  05/02/2011   Procedure: ESOPHAGOGASTRODUODENOSCOPY (EGD);  Surgeon: Lynwood LITTIE Celestia Mickey., MD;  Location: [facility];  Service: Endoscopy;  Laterality: N/A;   ESOPHAGOGASTRODUODENOSCOPY N/A 09/01/2014   Procedure: ESOPHAGOGASTRODUODENOSCOPY (EGD);  Surgeon: Lynwood Celestia, MD;  Location: [facility];  Service: Endoscopy;  Laterality: N/A;   IR CV LINE INJECTION  09/13/2023   IR IMAGING GUIDED PORT INSERTION  07/06/2023   KNEE SURGERY     LAPAROSCOPY     x 4   LESION REMOVAL N/A 01/18/2022   Procedure: EXCISION OF SEBACEOUS CYSTS CHEST AND NECK;  Surgeon: Vanderbilt Ned, MD;  Location: [facility];  Service: General;  Laterality: N/A;   PH IMPEDANCE STUDY N/A 11/04/2015   Procedure: PH IMPEDANCE STUDY;  Surgeon: Lynwood Bohr, MD;  Location: [facility];  Service: Endoscopy;  Laterality: N/A;   VAGINAL HYSTERECTOMY  2010   TVH--ovaries remain   Patient Active Problem List   Diagnosis Date Noted   Sjogren syndrome with keratoconjunctivitis 12/09/2022   Other hemochromatosis 06/10/2021   Elevated LFTs 04/04/2021   Colitis 04/04/2021   Bacterial overgrowth syndrome 05/21/2020   Obesity 05/21/2020   History of endometriosis 05/21/2020   Constipation due to outlet dysfunction 02/05/2020   Gastroesophageal reflux disease 02/05/2020   Obstructive sleep apnea treated  with continuous positive airway pressure (CPAP) 06/16/2017   Perennial allergic rhinitis with a nonallergic component 01/31/2017   Asthmatic bronchitis 01/31/2017   GI symptoms 01/31/2017   Family history of colon cancer 01/27/2015   Chest pain 12/13/2012   Dyspnea 12/13/2012   DVT of lower extremity (deep venous thrombosis) (HCC) 01/03/1990    PCP: Elliot Charm, MD  REFERRING PROVIDER: Cleotilde Ronal RAMAN, MD   REFERRING DIAG: M62.89 (ICD-10-CM) - Pelvic floor dysfunction  THERAPY DIAG:  Muscle weakness (generalized)  Other muscle spasm  Other low back pain  Pelvic pain  Unspecified lack of coordination  Rationale for Evaluation and Treatment: Rehabilitation  ONSET DATE: chronic  SUBJECTIVE:                                                                                                                                                                                           SUBJECTIVE STATEMENT:  She is waiting to get MRI results Friday. She had phlebotomy and humira  on Friday; she was having chest pain after that she plans to talk with rheumatology about. She states that she is having a lot of pain today in the Rt side of abdomen that is wrapping around to her low back. She states that she does get relief after going to the bathroom, but she has been consistently having bowel movements.   PAIN: 10/18/23 Are you having pain? Yes NPRS scale: 6-7/10 Pain location: Lower abdominal pain, Lt sided pain, low back pain  Pain type: turning, knotted, pressure Pain description: intermittent and constant   Aggravating factors: constipation or frequent bowel movements/constantly having bowel movements  Relieving factors: exercises (bowel massage, happy baby, spinal twists, walking)  PRECAUTIONS: None  WEIGHT BEARING RESTRICTIONS: No  FALLS:  Has patient fallen in last 6 months? No  LIVING ENVIRONMENT: Lives with: lives with their spouse Lives in:  House/apartment  OCCUPATION: nurse, in admin  PLOF: Independent  PATIENT GOALS: decrease pain and have more normal bowel movements  PERTINENT HISTORY:  Vaginal hysterectomy, DVT LE, endometriosis, interstitial cystitis, exploratory laps for endometriosis, c-section, cholecystectomy, RA diagnosed 06/2023 Sexual abuse: No  BOWEL MOVEMENT: Pain with bowel movement: Yes Type of bowel movement:Type (Bristol Stool Scale) 1-7 (sometimes full range in the same bowel movement), Frequency sometimes many times a day, sometimes up to 4 days in between, and Strain Yes Fully empty rectum: No Leakage: No Pads: No Fiber supplement: No - has attempted metamucil and it made constipation worse  URINATION: Pain with urination: Yes Fully empty bladder: No Stream: varies  Urgency: Yes: all the time Frequency: multiple times an hours  Leakage: Urge to void, Walking to the bathroom, Coughing, Sneezing, Laughing, and Exercise Pads: Yes: daily  INTERCOURSE: Pain with intercourse: Initial Penetration and During Penetration Ability to have vaginal penetration:  Yes: with pain Climax: non painful WNL   PREGNANCY: Vaginal deliveries 1 Tearing Yes: 3rd degree tear C-section deliveries 1 Currently pregnant No  PROLAPSE: Periodically will feel vaginal bulging, heaviness in lower abdomen   OBJECTIVE:  10/10/23 Curl-up test: Minimal abdominal distortion focused in upper abdominals, largely improved Transversus abdominus: good core facilitation with exhale Sit-up test: 1/3  LUMBARAROM/PROM:  A/PROM A/PROM  Eval (% available) 11/09/22 (% available) 04/25/23 (% available) 10/10/23 (% available)  Flexion 50 90 90 100  Extension 25, pain anteriorly  25, pain in low back 50 75  Right lateral flexion 50, pain anteriorly  75, pain on right 50, pain on Rt 75  Left lateral flexion 50, pain anteriorly  75, pain on right 75 75   (Blank rows = not tested)    05/04/23:               Internal Pelvic Floor:  mild tenderness in bil levator ani, but no increase in tension; with internal rectal exam, she was demonstrating paradoxical contraction when trying to eccentrically lengthen with exhale and had very hard time correcting  Patient confirms identification and approves PT to assess internal pelvic floor and treatment Yes  PELVIC MMT:   MMT eval  Vaginal 2/5, 3 second hold, 3 repeat contractions  Internal Anal Sphincter 1/5  External Anal Sphincter 2/5  Puborectalis 1/5  Diastasis Recti   (Blank rows = not tested)        TONE: WNL   PROLAPSE: Grade 2 anterior vaginal wall laxity  04/25/23: Curl-up test: abdominal distortion Transversus abdominus: week, difficulty getting active contraction (not been working on strengthening)    LUMBARAROM/PROM:  A/PROM A/PROM  Eval (% available) 11/09/22 (% available) 04/25/23 (% available)  Flexion 50 90 90  Extension 25, pain anteriorly  25, pain in low back 50  Right lateral flexion 50, pain anteriorly  75, pain on right 50, pain on Rt  Left lateral flexion 50, pain anteriorly  75, pain on right 75   (Blank rows = not tested)  11/09/22: No internal rectal or vaginal pelvic floor exam performed due to high level of skin irritation   Weak transversus abdominus contraction  Abdominal restriction and tenderness in bil lower quadrant  Unable to perform pelvic tilt with appropriate coordination  LUMBARAROM/PROM:  A/PROM A/PROM  Eval (% available) 11/09/22 (% available)  Flexion 50 90  Extension 25, pain anteriorly  25, pain in low back  Right lateral flexion 50, pain anteriorly  75, pain on right  Left lateral flexion 50, pain anteriorly  75, pain on right   (Blank rows = not tested) 07/21/22: standing prolapse assessment demonstrated grade 2 anterior vaginal wall laxity  06/08/22:               External Perineal Exam: WNL                              Internal Pelvic Floor: burning reported with palpation of superficial muscles; deep  aching/pressure with palpation of Lt levator ani   Patient confirms identification and approves PT to assess internal pelvic floor and treatment Yes  PELVIC MMT:   MMT eval  Vaginal 3/5  Internal Anal Sphincter 1/5  External Anal Sphincter 2/5  Puborectalis 1/5  Diastasis Recti   (Blank rows = not tested)        TONE: High in Lt levator ani   PROLAPSE: Not able to tell this session due to dyssynergic pelvic floor contraction     06/01/22: COGNITION: Overall cognitive status: Within functional limits for tasks assessed     SENSATION: Light touch: Appears intact Proprioception: Appears intact  GAIT: Comments: decreased hip extension, forward flexed trunk  POSTURE: rounded shoulders, forward head, decreased lumbar lordosis, increased thoracic kyphosis, posterior pelvic tilt, and flexed trunk    LUMBARAROM/PROM:  A/PROM A/PROM  Eval (% available)  Flexion 50  Extension 25, pain anteriorly   Right lateral flexion 50, pain anteriorly   Left lateral flexion 50, pain anteriorly    (Blank rows = not tested)   PALPATION:   General  Significant abdominal restriction and tenderness; decreased rib mobility with mobilization and breathing    TODAY'S TREATMENT:                                                                                                                              DATE:  10/18/23 Manual: Supine ILU bowel mobilization  Abdominal scar tissue mobilization Ileocecal valve mobilization External rectum mobilization Diaphragm release Lt side lying: Trigger point release to Rt obliques, thoracic and lumbar paraspinals, intercostals, quadratus lumborum   10/10/23 RE-EVAL Manual: ILU bowel mobilization  Abdominal scar tissue mobilization Ileocecal valve mobilization External rectum mobilization Diaphragm release All techniques performed in supine  Neuromuscular re-education: Bridge + hip adduction 2 x 10 Bridge with Mar 31, 2025x 10 Modified dead bug  10x bil Therapeutic activities: Squats to table 2 x 10 3 way kick 10x each, bil    10/02/23 Manual: ILU bowel mobilization  Abdominal scar tissue mobilization Ileocecal valve mobilization External rectum mobilization Myofascial release to abdomen with push/pull alternating forces horizontally  Diaphragm release All techniques performed in supine  Neuromuscular re-education: Supine bil shoulder abduction/extension + red band 2 x 10 Supine bent knee fall out + red band 10x bil Bridge with march 10x bil Modified dead bug 10x bil Exercises: Supine alternating shoulder flexion 2 x 10 Lower trunk rotation 2 x 10 Butterfly 10 breaths    PATIENT EDUCATION:  Education details: See above Person educated: Patient Education method: Explanation, Demonstration, Tactile cues, Verbal cues, and Handouts Education comprehension:  verbalized understanding  HOME EXERCISE PROGRAM: [id number]  ASSESSMENT:  CLINICAL IMPRESSION: Pt having difficult day with pain increase in Rt side. We discussed any changes and if she is walking with a bag; she reports wearing a cross body bag on Lt shoulder. This could be causing musculoskeletal symptoms in Rt abdomen/low-mid back as she has been increasing walking to 6-7x/week. She was encouraged to observe posture while walking with bag. We focused on manual techniques today since patient not feeling well. She did report improvement in disocomfort at end of session. She will continue to benefit from skilled PT intervention in order to decrease pain, improve bowel movements, and promote independence with HEP.     OBJECTIVE IMPAIRMENTS: decreased activity tolerance, decreased coordination, decreased endurance, decreased mobility, decreased strength, increased fascial restrictions, increased muscle spasms, impaired tone, postural dysfunction, and pain.   ACTIVITY LIMITATIONS: lifting, bending, continence, and locomotion level  PARTICIPATION LIMITATIONS: interpersonal  relationship, community activity, and occupation  PERSONAL FACTORS: 1 comorbidity: medical history are also affecting patient's functional outcome.   REHAB POTENTIAL: Good  CLINICAL DECISION MAKING: Stable/uncomplicated  EVALUATION COMPLEXITY: Low   GOALS: Goals reviewed with patient? Yes  SHORT TERM GOALS: Updated 10/10/23  Pt will be independent with HEP.   Baseline: Goal status: MET 07/12/22  2.  Pt will be independent with diaphragmatic breathing and down training activities in order to improve pelvic floor relaxation.  Baseline:  Goal status: MET 07/12/22  3.  Pt will be independent with the knack, urge suppression technique, and double voiding in order to improve bladder habits and decrease urinary incontinence.   Baseline:  Goal status: MET 09/22/22  4.  Pt will be independent with use of squatty potty, relaxed toileting mechanics, and improved bowel movement techniques in order to increase ease of bowel movements and complete evacuation.   Baseline:  Goal status: MET 11/09/22  5.  Pt will be able to correctly perform diaphragmatic breathing and appropriate pressure management in order to prevent worsening vaginal wall laxity and improve pelvic floor A/ROM.   Baseline:  Goal status: MET 01/18/23  LONG TERM GOALS: Updated 10/10/23  Pt will be independent with advanced HEP.   Baseline:  Goal status: IN PROGRESS 10/10/23  2.  Pt will demonstrate normal pelvic floor muscle tone and A/ROM, able to achieve 4/5 strength with contractions and 10 sec endurance, in order to provide appropriate lumbopelvic support in functional activities.   Baseline: not assessed today per patient request - no current bladder issues and they have all resolved Goal status: IN PROGRESS 10/10/23  3.  Pt will increase all impaired lumbar A/ROM by 25% without pain.  Baseline: Pt seen a little bit of loss in some and improvement in extension Goal status:  IN PROGRESS 10/10/23  4.  Pt will report  pain no higher than 3/10 with any activity. Baseline: pain currently 3/10, worst is an 8/10 and often corresponds to after she is eating Goal status:  IN PROGRESS 10/10/23  5.  Pt will report 0/10 pain with vaginal penetration in order to improve intimate relationship with partner.    Baseline: no recent intercourse attempts  Goal status:  IN PROGRESS 10/10/23  6.  Pt will be able to go 2-3 hours in between voids without urgency or incontinence in order to improve QOL and perform all functional activities with less difficulty.   Baseline:  Goal status: MET 02/21/23  7.  Pt will report no leaks with laughing, coughing, sneezing in order to improve comfort  with interpersonal relationships and community activities.   Baseline: Pt states that she feels 30-40% better Goal status:  MET 10/10/23  8.  Pt will have consistent bowel movements 4-5x/week without straining or pain.  Baseline:  Goal status:  MET 02/21/23  PLAN:  PT FREQUENCY: 1-2x/week  PT DURATION: 6 months  PLANNED INTERVENTIONS: Therapeutic exercises, Therapeutic activity, Neuromuscular re-education, Balance training, Gait training, Patient/Family education, Self Care, Joint mobilization, Dry Needling, Biofeedback, and Manual therapy  PLAN FOR NEXT SESSION: progress strengthening program; prepare for discharge in the next 6-8 visits   Josette Mares, PT, DPT10/15/2512:34 PM

## 2023-10-20 ENCOUNTER — Other Ambulatory Visit (HOSPITAL_COMMUNITY): Payer: Self-pay

## 2023-10-20 DIAGNOSIS — M5030 Other cervical disc degeneration, unspecified cervical region: Secondary | ICD-10-CM | POA: Diagnosis not present

## 2023-10-20 DIAGNOSIS — M542 Cervicalgia: Secondary | ICD-10-CM | POA: Diagnosis not present

## 2023-10-20 MED ORDER — PREDNISONE 5 MG (21) PO TBPK
5.0000 mg | ORAL_TABLET | ORAL | 1 refills | Status: DC
  Filled 2023-10-20: qty 21, 6d supply, fill #0

## 2023-10-23 ENCOUNTER — Ambulatory Visit: Admitting: Occupational Therapy

## 2023-10-23 ENCOUNTER — Ambulatory Visit

## 2023-10-23 DIAGNOSIS — M62838 Other muscle spasm: Secondary | ICD-10-CM | POA: Diagnosis not present

## 2023-10-23 DIAGNOSIS — M6281 Muscle weakness (generalized): Secondary | ICD-10-CM

## 2023-10-23 DIAGNOSIS — M5459 Other low back pain: Secondary | ICD-10-CM | POA: Diagnosis not present

## 2023-10-23 DIAGNOSIS — R279 Unspecified lack of coordination: Secondary | ICD-10-CM

## 2023-10-23 DIAGNOSIS — R102 Pelvic and perineal pain unspecified side: Secondary | ICD-10-CM

## 2023-10-23 NOTE — Therapy (Signed)
 OUTPATIENT PHYSICAL THERAPY FEMALE PELVIC TREATMENT     Patient Name: Karen [name] MRN: [id number] DOB:[birth year], 53 y.o., female Today's Date: 10/23/2023  END OF SESSION:  PT End of Session - 10/23/23 0854     Visit Number 54    Date for Recertification  12/26/23    Authorization Type [name]    PT Start Time [phone]    PT Stop Time 0929    PT Time Calculation (min) 38 min    Activity Tolerance Patient tolerated treatment well    Behavior During Therapy [facility] for tasks assessed/performed                      Past Medical History:  Diagnosis Date   Adenomyosis    Anemia    Arthritis 2013   L knee gets steroid injections    Arthritis, rheumatic, acute or subacute    Asthmatic bronchitis 01/31/2017   Back pain    Chronic constipation    Diarrhea    DVT of lower extremity (deep venous thrombosis) (HCC)    Dysmenorrhea    Endometriosis    Esophageal stricture    G6PD deficiency    GERD (gastroesophageal reflux disease)    History of kidney stones    History of uterine fibroid    IBS (irritable bowel syndrome)    Interstitial cystitis    Lactose intolerance    Migraine    with aura   Other hemochromatosis 06/10/2021   PONV (postoperative nausea and vomiting)    Recurrent upper respiratory infection (URI)    Sebaceous cyst of breast, right lower inner quadrant 10/25/2012   Excised 11/21/12    Sleep apnea    Syncope, non cardiac    Past Surgical History:  Procedure Laterality Date   ARTHROSCOPIC HAGLUNDS REPAIR     BREAST CYST EXCISION Right 11/21/2012   Procedure: CYST EXCISION BREAST;  Surgeon: Sherlean JINNY Laughter, MD;  Location: [facility];  Service: General;  Laterality: Right;   BREAST CYST EXCISION Bilateral 01/18/2022   Procedure: EXCISION OF SEBACEOUS CYST BILATERAL BREAST;  Surgeon: Vanderbilt Ned, MD;  Location: [facility];  Service: General;  Laterality: Bilateral;   BREAST EXCISIONAL BIOPSY  Right 11/2012   CESAREAN SECTION  04/19/1993   CHOLECYSTECTOMY     COLONOSCOPY  05/02/2011   Procedure: COLONOSCOPY;  Surgeon: Lynwood LITTIE Celestia Mickey., MD;  Location: [facility];  Service: Endoscopy;  Laterality: N/A;   colonoscopy  11/04/2014   DENTAL SURGERY  03/06/2018   2 surgeries ( 03/06/2018 and 04/06/2018)   to remove two separate benign tumors   ESOPHAGEAL MANOMETRY N/A 11/04/2015   Procedure: ESOPHAGEAL MANOMETRY (EM);  Surgeon: Lynwood Celestia, MD;  Location: [facility];  Service: Endoscopy;  Laterality: N/A;   ESOPHAGOGASTRODUODENOSCOPY  05/02/2011   Procedure: ESOPHAGOGASTRODUODENOSCOPY (EGD);  Surgeon: Lynwood LITTIE Celestia Mickey., MD;  Location: [facility];  Service: Endoscopy;  Laterality: N/A;   ESOPHAGOGASTRODUODENOSCOPY N/A 09/01/2014   Procedure: ESOPHAGOGASTRODUODENOSCOPY (EGD);  Surgeon: Lynwood Celestia, MD;  Location: [facility];  Service: Endoscopy;  Laterality: N/A;   IR CV LINE INJECTION  09/13/2023   IR IMAGING GUIDED PORT INSERTION  07/06/2023   KNEE SURGERY     LAPAROSCOPY     x 4   LESION REMOVAL N/A 01/18/2022   Procedure: EXCISION OF SEBACEOUS CYSTS CHEST AND NECK;  Surgeon: Vanderbilt Ned, MD;  Location: [facility];  Service: General;  Laterality: N/A;   PH IMPEDANCE STUDY N/A  11/04/2015   Procedure: PH IMPEDANCE STUDY;  Surgeon: Lynwood Bohr, MD;  Location: [facility];  Service: Endoscopy;  Laterality: N/A;   VAGINAL HYSTERECTOMY  2010   TVH--ovaries remain   Patient Active Problem List   Diagnosis Date Noted   Sjogren syndrome with keratoconjunctivitis 12/09/2022   Other hemochromatosis 06/10/2021   Elevated LFTs 04/04/2021   Colitis 04/04/2021   Bacterial overgrowth syndrome 05/21/2020   Obesity 05/21/2020   History of endometriosis 05/21/2020   Constipation due to outlet dysfunction 02/05/2020   Gastroesophageal reflux disease 02/05/2020   Obstructive sleep apnea treated with continuous positive airway pressure (CPAP) 06/16/2017   Perennial  allergic rhinitis with a nonallergic component 01/31/2017   Asthmatic bronchitis 01/31/2017   GI symptoms 01/31/2017   Family history of colon cancer 01/27/2015   Chest pain 12/13/2012   Dyspnea 12/13/2012   DVT of lower extremity (deep venous thrombosis) (HCC) 01/03/1990    PCP: Elliot Charm, MD  REFERRING PROVIDER: Cleotilde Ronal RAMAN, MD   REFERRING DIAG: M62.89 (ICD-10-CM) - Pelvic floor dysfunction  THERAPY DIAG:  Muscle weakness (generalized)  Other muscle spasm  Other low back pain  Pelvic pain  Unspecified lack of coordination  Rationale for Evaluation and Treatment: Rehabilitation  ONSET DATE: chronic  SUBJECTIVE:                                                                                                                                                                                           SUBJECTIVE STATEMENT:  Pt states that she has received MRI results and was encouraged to continue PT for neck pain, but a steroid injection may also be helpful.   PAIN: 10/23/23 Are you having pain? Yes NPRS scale: 6-7/10 Pain location: Lower abdominal pain, Lt sided pain, low back pain  Pain type: turning, knotted, pressure Pain description: intermittent and constant   Aggravating factors: constipation or frequent bowel movements/constantly having bowel movements  Relieving factors: exercises (bowel massage, happy baby, spinal twists, walking)  PRECAUTIONS: None  WEIGHT BEARING RESTRICTIONS: No  FALLS:  Has patient fallen in last 6 months? No  LIVING ENVIRONMENT: Lives with: lives with their spouse Lives in: House/apartment  OCCUPATION: nurse, in admin  PLOF: Independent  PATIENT GOALS: decrease pain and have more normal bowel movements  PERTINENT HISTORY:  Vaginal hysterectomy, DVT LE, endometriosis, interstitial cystitis, exploratory laps for endometriosis, c-section, cholecystectomy, RA diagnosed 06/2023 Sexual abuse: No  BOWEL  MOVEMENT: Pain with bowel movement: Yes Type of bowel movement:Type (Bristol Stool Scale) 1-7 (sometimes full range in the same bowel movement), Frequency sometimes many times a day, sometimes up to 4 days in between, and  Strain Yes Fully empty rectum: No Leakage: No Pads: No Fiber supplement: No - has attempted metamucil and it made constipation worse  URINATION: Pain with urination: Yes Fully empty bladder: No Stream: varies  Urgency: Yes: all the time Frequency: multiple times an hours  Leakage: Urge to void, Walking to the bathroom, Coughing, Sneezing, Laughing, and Exercise Pads: Yes: daily  INTERCOURSE: Pain with intercourse: Initial Penetration and During Penetration Ability to have vaginal penetration:  Yes: with pain Climax: non painful WNL   PREGNANCY: Vaginal deliveries 1 Tearing Yes: 3rd degree tear C-section deliveries 1 Currently pregnant No  PROLAPSE: Periodically will feel vaginal bulging, heaviness in lower abdomen   OBJECTIVE:  10/10/23 Curl-up test: Minimal abdominal distortion focused in upper abdominals, largely improved Transversus abdominus: good core facilitation with exhale Sit-up test: 1/3  LUMBARAROM/PROM:  A/PROM A/PROM  Eval (% available) 11/09/22 (% available) 04/25/23 (% available) 10/10/23 (% available)  Flexion 50 90 90 100  Extension 25, pain anteriorly  25, pain in low back 50 75  Right lateral flexion 50, pain anteriorly  75, pain on right 50, pain on Rt 75  Left lateral flexion 50, pain anteriorly  75, pain on right 75 75   (Blank rows = not tested)    05/04/23:               Internal Pelvic Floor: mild tenderness in bil levator ani, but no increase in tension; with internal rectal exam, she was demonstrating paradoxical contraction when trying to eccentrically lengthen with exhale and had very hard time correcting  Patient confirms identification and approves PT to assess internal pelvic floor and treatment Yes  PELVIC MMT:    MMT eval  Vaginal 2/5, 3 second hold, 3 repeat contractions  Internal Anal Sphincter 1/5  External Anal Sphincter 2/5  Puborectalis 1/5  Diastasis Recti   (Blank rows = not tested)        TONE: WNL   PROLAPSE: Grade 2 anterior vaginal wall laxity  04/25/23: Curl-up test: abdominal distortion Transversus abdominus: week, difficulty getting active contraction (not been working on strengthening)    LUMBARAROM/PROM:  A/PROM A/PROM  Eval (% available) 11/09/22 (% available) 04/25/23 (% available)  Flexion 50 90 90  Extension 25, pain anteriorly  25, pain in low back 50  Right lateral flexion 50, pain anteriorly  75, pain on right 50, pain on Rt  Left lateral flexion 50, pain anteriorly  75, pain on right 75   (Blank rows = not tested)  11/09/22: No internal rectal or vaginal pelvic floor exam performed due to high level of skin irritation   Weak transversus abdominus contraction  Abdominal restriction and tenderness in bil lower quadrant  Unable to perform pelvic tilt with appropriate coordination  LUMBARAROM/PROM:  A/PROM A/PROM  Eval (% available) 11/09/22 (% available)  Flexion 50 90  Extension 25, pain anteriorly  25, pain in low back  Right lateral flexion 50, pain anteriorly  75, pain on right  Left lateral flexion 50, pain anteriorly  75, pain on right   (Blank rows = not tested) 07/21/22: standing prolapse assessment demonstrated grade 2 anterior vaginal wall laxity   06/08/22:               External Perineal Exam: WNL                              Internal Pelvic Floor: burning reported with palpation of superficial muscles;  deep aching/pressure with palpation of Lt levator ani   Patient confirms identification and approves PT to assess internal pelvic floor and treatment Yes  PELVIC MMT:   MMT eval  Vaginal 3/5  Internal Anal Sphincter 1/5  External Anal Sphincter 2/5  Puborectalis 1/5  Diastasis Recti   (Blank rows = not tested)        TONE: High in  Lt levator ani   PROLAPSE: Not able to tell this session due to dyssynergic pelvic floor contraction     06/01/22: COGNITION: Overall cognitive status: Within functional limits for tasks assessed     SENSATION: Light touch: Appears intact Proprioception: Appears intact  GAIT: Comments: decreased hip extension, forward flexed trunk  POSTURE: rounded shoulders, forward head, decreased lumbar lordosis, increased thoracic kyphosis, posterior pelvic tilt, and flexed trunk    LUMBARAROM/PROM:  A/PROM A/PROM  Eval (% available)  Flexion 50  Extension 25, pain anteriorly   Right lateral flexion 50, pain anteriorly   Left lateral flexion 50, pain anteriorly    (Blank rows = not tested)   PALPATION:   General  Significant abdominal restriction and tenderness; decreased rib mobility with mobilization and breathing    TODAY'S TREATMENT:                                                                                                                              DATE:  10/23/23 Manual: Supine: ILU bowel mobilization  Abdominal scar tissue mobilization Ileocecal valve mobilization External rectum mobilization Diaphragm release Neuromuscular re-education: Seated hip adduction ball press with transversus abdominus and pelvic floor muscle 2 x 10 Seated hip abduction red band with transversus abdominus and pelvic floor muscle 2 x 10 Seated resisted march red band with transversus abdominus and pelvic floor muscle 2 x 10 Seated unilateral hip abduction + red band 15x bil Seated thoracic rotations for abdominal mobility 10x Supine leg extensions 10x bil  10/18/23 Manual: Supine ILU bowel mobilization  Abdominal scar tissue mobilization Ileocecal valve mobilization External rectum mobilization Diaphragm release Lt side lying: Trigger point release to Rt obliques, thoracic and lumbar paraspinals, intercostals, quadratus lumborum   10/10/23 RE-EVAL Manual: ILU bowel  mobilization  Abdominal scar tissue mobilization Ileocecal valve mobilization External rectum mobilization Diaphragm release All techniques performed in supine  Neuromuscular re-education: Bridge + hip adduction 2 x 10 Bridge with Mar 25, 2025x 10 Modified dead bug 10x bil Therapeutic activities: Squats to table 2 x 10 3 way kick 10x each, bil     PATIENT EDUCATION:  Education details: See above Person educated: Patient Education method: Explanation, Demonstration, Tactile cues, Verbal cues, and Handouts Education comprehension: verbalized understanding  HOME EXERCISE PROGRAM: [id number]  ASSESSMENT:  CLINICAL IMPRESSION: Pt doing well over the last week being able to maintain her walking, but she has had diarrhea twice. She has met with orthopedist to get MRI results and is going to continue PT for neck pain and consider an injection to c-spine.  She did well with return to strengthening activities in seated and supine this visit without any pain. Continued manual techniques to help improve motility. She will continue to benefit from skilled PT intervention in order to decrease pain, improve bowel movements, and promote independence with HEP.     OBJECTIVE IMPAIRMENTS: decreased activity tolerance, decreased coordination, decreased endurance, decreased mobility, decreased strength, increased fascial restrictions, increased muscle spasms, impaired tone, postural dysfunction, and pain.   ACTIVITY LIMITATIONS: lifting, bending, continence, and locomotion level  PARTICIPATION LIMITATIONS: interpersonal relationship, community activity, and occupation  PERSONAL FACTORS: 1 comorbidity: medical history are also affecting patient's functional outcome.   REHAB POTENTIAL: Good  CLINICAL DECISION MAKING: Stable/uncomplicated  EVALUATION COMPLEXITY: Low   GOALS: Goals reviewed with patient? Yes  SHORT TERM GOALS: Updated 10/10/23  Pt will be independent with HEP.   Baseline: Goal  status: MET 07/12/22  2.  Pt will be independent with diaphragmatic breathing and down training activities in order to improve pelvic floor relaxation.  Baseline:  Goal status: MET 07/12/22  3.  Pt will be independent with the knack, urge suppression technique, and double voiding in order to improve bladder habits and decrease urinary incontinence.   Baseline:  Goal status: MET 09/22/22  4.  Pt will be independent with use of squatty potty, relaxed toileting mechanics, and improved bowel movement techniques in order to increase ease of bowel movements and complete evacuation.   Baseline:  Goal status: MET 11/09/22  5.  Pt will be able to correctly perform diaphragmatic breathing and appropriate pressure management in order to prevent worsening vaginal wall laxity and improve pelvic floor A/ROM.   Baseline:  Goal status: MET 01/18/23  LONG TERM GOALS: Updated 10/10/23  Pt will be independent with advanced HEP.   Baseline:  Goal status: IN PROGRESS 10/10/23  2.  Pt will demonstrate normal pelvic floor muscle tone and A/ROM, able to achieve 4/5 strength with contractions and 10 sec endurance, in order to provide appropriate lumbopelvic support in functional activities.   Baseline: not assessed today per patient request - no current bladder issues and they have all resolved Goal status: IN PROGRESS 10/10/23  3.  Pt will increase all impaired lumbar A/ROM by 25% without pain.  Baseline: Pt seen a little bit of loss in some and improvement in extension Goal status:  IN PROGRESS 10/10/23  4.  Pt will report pain no higher than 3/10 with any activity. Baseline: pain currently 3/10, worst is an 8/10 and often corresponds to after she is eating Goal status:  IN PROGRESS 10/10/23  5.  Pt will report 0/10 pain with vaginal penetration in order to improve intimate relationship with partner.    Baseline: no recent intercourse attempts  Goal status:  IN PROGRESS 10/10/23  6.  Pt will be able to  go 2-3 hours in between voids without urgency or incontinence in order to improve QOL and perform all functional activities with less difficulty.   Baseline:  Goal status: MET 02/21/23  7.  Pt will report no leaks with laughing, coughing, sneezing in order to improve comfort with interpersonal relationships and community activities.   Baseline: Pt states that she feels 30-40% better Goal status:  MET 10/10/23  8.  Pt will have consistent bowel movements 4-5x/week without straining or pain.  Baseline:  Goal status:  MET 02/21/23  PLAN:  PT FREQUENCY: 1-2x/week  PT DURATION: 6 months  PLANNED INTERVENTIONS: Therapeutic exercises, Therapeutic activity, Neuromuscular re-education, Balance training, Gait training, Patient/Family education,  Self Care, Joint mobilization, Dry Needling, Biofeedback, and Manual therapy  PLAN FOR NEXT SESSION: progress strengthening program; prepare for discharge in the next 6-8 visits   Josette Mares, PT, DPT10/20/258:54 AM

## 2023-10-24 ENCOUNTER — Encounter: Payer: Self-pay | Admitting: Occupational Therapy

## 2023-10-24 ENCOUNTER — Ambulatory Visit: Admitting: Occupational Therapy

## 2023-10-24 DIAGNOSIS — I890 Lymphedema, not elsewhere classified: Secondary | ICD-10-CM

## 2023-10-24 NOTE — Therapy (Signed)
 OUTPATIENT OCCUPATIONAL THERAPY TREATMENT NOTE  BLE/ BLQ LYMPHEDEMA  Patient Name: Karen [name] MRN: [id number] DOB:17-Mar-1970, 53 y.o., female Today's Date: 10/24/2023  REPORTING PERIOD:  END OF SESSION:  OT End of Session - 10/24/23 1135     Visit Number 59    Number of Visits 72    Date for Recertification  11/16/23    OT Start Time 1100    OT Stop Time 1215    OT Time Calculation (min) 75 min    Activity Tolerance Patient tolerated treatment well;No increased pain    Behavior During Therapy [facility] for tasks assessed/performed                Past Medical History:  Diagnosis Date   Adenomyosis    Anemia    Arthritis 2013   L knee gets steroid injections    Arthritis, rheumatic, acute or subacute    Asthmatic bronchitis 01/31/2017   Back pain    Chronic constipation    Diarrhea    DVT of lower extremity (deep venous thrombosis) (HCC)    Dysmenorrhea    Endometriosis    Esophageal stricture    G6PD deficiency    GERD (gastroesophageal reflux disease)    History of kidney stones    History of uterine fibroid    IBS (irritable bowel syndrome)    Interstitial cystitis    Lactose intolerance    Migraine    with aura   Other hemochromatosis 06/10/2021   PONV (postoperative nausea and vomiting)    Recurrent upper respiratory infection (URI)    Sebaceous cyst of breast, right lower inner quadrant 10/25/2012   Excised 11/21/12    Sleep apnea    Syncope, non cardiac    Past Surgical History:  Procedure Laterality Date   ARTHROSCOPIC HAGLUNDS REPAIR     BREAST CYST EXCISION Right 11/21/2012   Procedure: CYST EXCISION BREAST;  Surgeon: Sherlean JINNY Laughter, MD;  Location: [facility];  Service: General;  Laterality: Right;   BREAST CYST EXCISION Bilateral 01/18/2022   Procedure: EXCISION OF SEBACEOUS CYST BILATERAL BREAST;  Surgeon: Vanderbilt Ned, MD;  Location: [facility];  Service: General;  Laterality: Bilateral;   BREAST  EXCISIONAL BIOPSY Right 11/2012   CESAREAN SECTION  04/19/1993   CHOLECYSTECTOMY     COLONOSCOPY  05/02/2011   Procedure: COLONOSCOPY;  Surgeon: Lynwood LITTIE Celestia Mickey., MD;  Location: [facility];  Service: Endoscopy;  Laterality: N/A;   colonoscopy  11/04/2014   DENTAL SURGERY  03/06/2018   2 surgeries ( 03/06/2018 and 04/06/2018)   to remove two separate benign tumors   ESOPHAGEAL MANOMETRY N/A 11/04/2015   Procedure: ESOPHAGEAL MANOMETRY (EM);  Surgeon: Lynwood Celestia, MD;  Location: [facility];  Service: Endoscopy;  Laterality: N/A;   ESOPHAGOGASTRODUODENOSCOPY  05/02/2011   Procedure: ESOPHAGOGASTRODUODENOSCOPY (EGD);  Surgeon: Lynwood LITTIE Celestia Mickey., MD;  Location: [facility];  Service: Endoscopy;  Laterality: N/A;   ESOPHAGOGASTRODUODENOSCOPY N/A 09/01/2014   Procedure: ESOPHAGOGASTRODUODENOSCOPY (EGD);  Surgeon: Lynwood Celestia, MD;  Location: [facility];  Service: Endoscopy;  Laterality: N/A;   IR CV LINE INJECTION  09/13/2023   IR IMAGING GUIDED PORT INSERTION  07/06/2023   KNEE SURGERY     LAPAROSCOPY     x 4   LESION REMOVAL N/A 01/18/2022   Procedure: EXCISION OF SEBACEOUS CYSTS CHEST AND NECK;  Surgeon: Vanderbilt Ned, MD;  Location: [facility];  Service: General;  Laterality: N/A;   PH IMPEDANCE STUDY N/A 11/04/2015  Procedure: PH IMPEDANCE STUDY;  Surgeon: Lynwood Bohr, MD;  Location: [facility];  Service: Endoscopy;  Laterality: N/A;   VAGINAL HYSTERECTOMY  2010   TVH--ovaries remain   Patient Active Problem List   Diagnosis Date Noted   Sjogren syndrome with keratoconjunctivitis 12/09/2022   Other hemochromatosis 06/10/2021   Elevated LFTs 04/04/2021   Colitis 04/04/2021   Bacterial overgrowth syndrome 05/21/2020   Obesity 05/21/2020   History of endometriosis 05/21/2020   Constipation due to outlet dysfunction 02/05/2020   Gastroesophageal reflux disease 02/05/2020   Obstructive sleep apnea treated with continuous positive airway pressure (CPAP)  06/16/2017   Perennial allergic rhinitis with a nonallergic component 01/31/2017   Asthmatic bronchitis 01/31/2017   GI symptoms 01/31/2017   Family history of colon cancer 01/27/2015   Chest pain 12/13/2012   Dyspnea 12/13/2012   DVT of lower extremity (deep venous thrombosis) (HCC) 01/03/1990    PCP: Gaile New, MD  REFERRING PROVIDER: Valery Ripple, MD  REFERRING DIAG: I89.0  THERAPY DIAG:  Lymphedema, not elsewhere classified  Rationale for Evaluation and Treatment: Rehabilitation  ONSET DATE: ~2014; exacerbation in 2023  SUBJECTIVE:                                                                                                                                                                                          SUBJECTIVE STATEMENT:  Pt presents for OT to address lower quadrant, including deep abdominal lymphatics, and LLE lymphedema w/ associated pain. Pt denies LE related pain today. Pt reports she saw her orthopedist during session interval and he s questioning her RA diagnosis. 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  Manufacturer's rep for [employer] is here today to assist w/ a trial of advanced Flexitouch sequential pneumatic compression device ([id number]).   PERTINENT HISTORY: CVI, OSA (no CPAP), GERD, Esophageal stricture, IBS, Lactose intolerant, Diarrhea, Hx LE DVT, recent RA dx  PAIN:  Are you having pain? Yes: 0/10 Pain location: abdomen; digestive discomfort Pain description: abdominal fullness, tightness Aggravating factors: standing, walking,  Relieving factors: MLD, Miralax ,   PRECAUTIONS: Other: LYMPHEDEMA; Hx LLE DVT  WEIGHT BEARING RESTRICTIONS: No  FALLS:  Has patient fallen in last 6 months? No  LIVING ENVIRONMENT: Lives with: lives with spouse and daughter Lives in: House/apartment Stairs: Yes; Internal: 14 steps; yes and External: 4 steps; yes Has following equipment at home:  None  OCCUPATION: Diplomatic Services operational officer, walking, desk work  LEISURE: family time  HAND DOMINANCE: right   PRIOR LEVEL OF FUNCTION: Independent  PATIENT GOALS: Feel better, be able to be more active without pain, wear preferred street  shoes. NEW 08/03/23: Be able to manage digestive discomfort and gut inflammation symptoms without medications  OBJECTIVE: Note: Objective measures were completed at Evaluation unless otherwise noted.  COGNITION:  Overall cognitive status: Within functional limits for tasks assessed   POSTURE: WFL  LE ROM: WFL  LE MMT: WFL  LYMPHEDEMA ASSESSMENTS: non-Ca related  INFECTIONS: denies cellulitis and wound hx  BLE COMPARATIVE LIMB VOLUMETRICS Initial 11/15/22  LANDMARK RIGHT (dominant)  R LEG (A-D) 3028.9 ml  R THIGH (E-G) 4809.60 ml  R FULL LIMB (A-G) 7838.5 ml  Limb Volume differential (LVD)  %  Volume change since initial %  Volume change overall V  (Blank rows = not tested)  LANDMARK LEFT  (Rx)  L LEG (A-D) 3189.9 ml  L THIGH (E-G) 4959.8 ml  L FULL LIMB (A-G) 8149.7 ml  Limb Volume differential (LVD)  LEG LVD = 5.32%, L>R; THIGH LVD = 3.12%, L>R; And full LLE LVD = 3.97%, L>R.   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  Skin  Description Hyper-Keratosis Peau d' Orange Shiny Tight Fibrotic/ Indurated Fatty Doughy Spongy/ boggy       R>L x  x   Skin dry Flaky WNL Macerated   mildly      Color Redness Varicosities Blanching Hemosiderin Stain Mottled        x   Odor Malodorous Yeast Fungal infection  WNL      x   Temperature Warm  Cool wnl     x    Pitting Edema   1+ 2+ 3+ 4+ Non-pitting         x   Girth Symmetrical Asymmetrical                   Distribution    L>R toes to groin    Stemmer Sign Positive Negative   +L -R   Lymphorrhea History Of:  Present Absent     x    Wounds History Of Present Absent Venous Arterial Pressure Sheer     x        Signs of Infection Redness Warmth Erythema Acute Swelling Drainage Borders                    Sensation Light Touch Deep pressure Hypersensitivity   In tact Impaired In tact Impaired Absent Impaired   x  x  x     Nails WNL   Fungus nail dystrophy   x     Hair Growth Symmetrical Asymmetrical   x    Skin Creases Base of toes  Ankles   Base of Fingers knees       Abdominal pannus Thigh Lobules  Face/neck   x           GAIT: Distance walked: >500' Assistive device utilized: None Level of assistance: Complete Independence Comments: Functional ambulation and transfers [facility]  LYMPHEDEMA LIFE IMPACT SCALE (LLIS): Initial 11/15/22  50%  FOTO functional outcome measure: Deferred. FOTO discontinued.  TODAY'S TREATMENT:                                                                                                                                         MLD to abdominal lymphatics Pt edu for LE self care  PATIENT EDUCATION:  Continued Pt/ CG edu for how function of cisterna chyli, part of the thoracic duct of deep abdominal lymphatics, assists with digestion by filtering many of the fats and proteins from the digestive system. Sub optimal lymphatic flow in this region may result in constipation, bloating, swelling, etc. MLD helps mobilize these protein and fats , and the rhythmic movement of manual lymphatic drainage stimulates digestive muscles and increase peristalsis. Provided printed resource for reference.  Topics include outcome of comparative limb volumetrics- starting limb volume differentials (LVDs), technology and gradient techniques  used for short stretch, multilayer compression wrapping, simple self-MLD, therapeutic lymphatic pumping exercises, skin/nail care, LE precautions,. compression garment recommendations and specifications, wear and care schedule and compression garment donning / doffing w assistive devices. Discussed progress towards all OT goals since commencing CDT. All questions answered to the Pt's satisfaction. Good return. Person educated: Patient  Education method: Explanation, Demonstration, and Handouts Education comprehension: verbalized understanding, returned demonstration, verbal cues required, and needs further education  [facility] PRECAUTIONS: Pt education re precautions related to use of advanced sequential pneumatic compression device. Pt verbalized understanding that she should never use device on 2 arms/legs simultaneously and should not use device 2 x on same day to avoid overloading her heart with fluid volume return both at present, and in years to come should her medical condition change. Pt instructed to remove device immediately should she experience atypical SOP, light headedness, or acute pain. Pt instructed to discontinue pump if she suspects, or has any infection, blood clot, cellulitis, the flu, corona virus, etc.   HOME EXERCISE PROGRAM: BLE lymphatic pumping there ex using- 1 sets of 10 reps, each exercise in order-  1-2 x daily, bilaterally Simple self MLD 1 x daily Daily skin care to increase hydration, skin mobility and decrease infection risk- can be done during MLD Compression wraps 23/7 during intensive phase of CDT Compression garments/ devices during self-management phase of CDT  ASSESSMENT:  CLINICAL IMPRESSION: Resumed short neck sequence since port tenderness and R lateral neck swelling is resolved this visit. No SOB, dizziness, or increased pain observed or reported during session. Pt continues to have digestive discomfort associated with chronic constipation alternating  with diarrhea.  Pt tolerated the abdominal sequence as established without increased pain. Pt continues to  benefit from MLD for enhancing gut motility and bowel function.  MLD is also known to facilitate the removal of inflammatory substances and toxins from the intestinal area, which may help reduce inflammation linked to digestive disorders.   Pt underwrent a 45 minute trial of the advanced Flexitouch sequential pneumatic compression device for 45 minutes in  the clinic this morning without increased pain. Palpable tissue siftening at distal arm and wrist are noted post trial. The advanced Flexitouch device addresses lymphatic anatomy in the affected limb and quadrant by facilitating lymphativc overload towards anterior and posterior axillary anastomoses and towards the ipsilateral axillary-inguinal pathway. The advanced Flexitouch device is medically necessary for Karen [name] to reduce lymphedema progression, to infection risk and to assist with lymphedema self-management at home over time.   Cont  1 x weekly as per POC.  11/15/22 Initial Evaluation: Karen [name] is a 53 y.o. female presenting with very mild, stage II, LLE lymphedema 2/2 venous insufficiency and obesity. L Leg swelling and associated pain swelling fluctuates. It has progressed over time, and now no longer resolves with elevation over night. RLE swelling limits balance during functional ambulation. It exacerbates infection and falls risk. LLE/RLQ lymphedema interferes with functional performance in all occupational domains, including basic and instrumental ADLs, productive activities, leisure pursuits and social participation. Pt will benefit from Occupational Therapy for Complete Decongestive Therapy (CDT) to restore function, to reduce physical and psychologic suffering associated with chronic, progressive lymphedema and associated pain, and to limit infection. CDT will include manual lymphatic drainage (MLD), skin care, therapeutic  exercise and compression wraps and garment's devices. Without skilled OT for lymphedema care the condition will worsen and further functional decline is expected  Custom-made gradient compression garments and HOS devices are medically necessary because they are uniquely sized and shaped to fit the exact dimensions of the affected extremities, and to provide appropriate medical grade, graduated compression essential for optimally managing chronic, progressive lymphedema. Multiple custom compression garments are needed to ensure proper hygiene to limit infection risk. Custom compression garments should be replaced q 3-6 months When worn consistently for optimal lipo-lymphedema self-management over time. HOS devices, medically necessary to limit fibrosis buildup in tissue, should be replaced q 2 years and PRN when worn out.      OBJECTIVE IMPAIRMENTS: decreased activity tolerance, decreased knowledge of condition, decreased knowledge of use of DME, increased edema, impaired sensation, pain, and chronic, progressive leg swelling.   ACTIVITY LIMITATIONS: dependent sitting, extended standing and/ or walking, squatting, and lower body dressing, fitting preferred street shoes  PARTICIPATION LIMITATIONS: shopping, community activity, occupation, and yard work  PERSONAL FACTORS: 3+ comorbidities: Hx DVT,  OSA, CVI. Varicose veins are also affecting patient's functional outcome.   REHAB POTENTIAL: Good  EVALUATION COMPLEXITY: Moderate   GOALS: Goals reviewed with patient? Yes  SHORT TERM GOALS: Target date: 4th OT Rx visit  SHORT TERM GOALS: Target date: 4th OT Rx visit   Pt will demonstrate understanding of lymphedema precautions and prevention strategies with modified independence using a printed reference to identify at least 5 precautions and discussing how s/he may implement them into daily life to reduce risk of progression with extra time. Baseline:Max A Goal status: GOAL MET   Pt will be  able to apply multilayer, knee length, gradient, compression wraps to one leg at a time with modified assistance (extra time and assistive device/s) to decrease limb volume, to limit infection risk, and to limit lymphedema progression.  Baseline: Dependent Goal status: GOAL DISCHARGED. Pt Going to compression garments instead  LONG TERM GOALS: Target date: 02/08/23  Given this patient's Intake score of 67%/100% on the functional outcomes FOTO tool, patient will experience an increase in function of 5 points to improve basic and instrumental ADLs performance, including lymphedema self-care.  Baseline: Max A Goal status:GOAL DISCHARGED.  FOTO TOOL DISCONTINUED AT CLINIC  Given this patient's Intake score of 50% on the Lymphedema Life Impact Scale (LLIS), patient will experience a reduction of at least 5 points in her perceived level of functional impairment resulting from lymphedema to improve functional performance and quality of life (QOL). Baseline: 50% Goal status: PROGRESSING  Pt will achieve at least a 10% volume reduction in B legs to return limb to typical size and shape, to limit infection risk and LE progression, to decrease pain, to improve function. Baseline: Dependent Goal status: PROGRESSING  4.  Pt will obtain proper compression garments/devices and achieve modified independence (extra time + assistive devices) with donning/doffing to optimize limb volume reductions and limit LE  progression over time. Baseline:  Goal status:GOAL MET  5.  During Intensive phase CDT , with modified independence, Pt will achieve at least 85% compliance with all lymphedema self-care home program components, including daily skin care, compression wraps and /or garments, simple self MLD and lymphatic pumping therex to habituate LE self care protocol  into ADLs for optimal LE self-management over time. Baseline: Dependent Goal status:GOAL MET   PLAN:  OT FREQUENCY: 1 /week  OT DURATION: 12 weeks  and PRN  PLANNED INTERVENTIONS: 97110-Therapeutic exercises, 97530- Therapeutic activity, 97535- Self Care, [id number]- Manual therapy, Patient/Family education, Manual lymph drainage, Compression bandaging, and fit with appropriate compression garments  PLAN FOR NEXT SESSION:  Progress note Cont MLD as established Pt and family edu for LE self-care  Zebedee Dec, MS, OTR/L, CLT-LANA 10/24/23 12:55 PM

## 2023-10-25 ENCOUNTER — Ambulatory Visit: Admitting: Physical Therapy

## 2023-10-25 DIAGNOSIS — M3501 Sicca syndrome with keratoconjunctivitis: Secondary | ICD-10-CM | POA: Diagnosis not present

## 2023-10-25 DIAGNOSIS — M0600 Rheumatoid arthritis without rheumatoid factor, unspecified site: Secondary | ICD-10-CM | POA: Diagnosis not present

## 2023-10-25 DIAGNOSIS — K580 Irritable bowel syndrome with diarrhea: Secondary | ICD-10-CM | POA: Diagnosis not present

## 2023-10-25 DIAGNOSIS — M4722 Other spondylosis with radiculopathy, cervical region: Secondary | ICD-10-CM | POA: Diagnosis not present

## 2023-10-25 DIAGNOSIS — M5459 Other low back pain: Secondary | ICD-10-CM | POA: Diagnosis not present

## 2023-10-25 DIAGNOSIS — L280 Lichen simplex chronicus: Secondary | ICD-10-CM | POA: Diagnosis not present

## 2023-10-25 DIAGNOSIS — M62838 Other muscle spasm: Secondary | ICD-10-CM | POA: Diagnosis not present

## 2023-10-25 DIAGNOSIS — Z6833 Body mass index (BMI) 33.0-33.9, adult: Secondary | ICD-10-CM | POA: Diagnosis not present

## 2023-10-25 DIAGNOSIS — M6281 Muscle weakness (generalized): Secondary | ICD-10-CM

## 2023-10-25 DIAGNOSIS — Z79899 Other long term (current) drug therapy: Secondary | ICD-10-CM | POA: Diagnosis not present

## 2023-10-25 DIAGNOSIS — E669 Obesity, unspecified: Secondary | ICD-10-CM | POA: Diagnosis not present

## 2023-10-25 DIAGNOSIS — M542 Cervicalgia: Secondary | ICD-10-CM

## 2023-10-25 NOTE — Therapy (Signed)
 OUTPATIENT PHYSICAL THERAPY CERVICAL TREATMENT   Patient Name: Karen [name] MRN: [id number] DOB:25-Feb-1970, 53 y.o., female Today's Date: 10/25/2023  END OF SESSION:  PT End of Session - 10/25/23 1228     Visit Number 13    Date for Recertification  12/26/23    Authorization Type [name] Employee    PT Start Time 1229    PT Stop Time 1313    PT Time Calculation (min) 44 min    Activity Tolerance Patient tolerated treatment well                  Past Medical History:  Diagnosis Date   Adenomyosis    Anemia    Arthritis 2013   L knee gets steroid injections    Arthritis, rheumatic, acute or subacute    Asthmatic bronchitis 01/31/2017   Back pain    Chronic constipation    Diarrhea    DVT of lower extremity (deep venous thrombosis) (HCC)    Dysmenorrhea    Endometriosis    Esophageal stricture    G6PD deficiency    GERD (gastroesophageal reflux disease)    History of kidney stones    History of uterine fibroid    IBS (irritable bowel syndrome)    Interstitial cystitis    Lactose intolerance    Migraine    with aura   Other hemochromatosis 06/10/2021   PONV (postoperative nausea and vomiting)    Recurrent upper respiratory infection (URI)    Sebaceous cyst of breast, right lower inner quadrant 10/25/2012   Excised 11/21/12    Sleep apnea    Syncope, non cardiac    Past Surgical History:  Procedure Laterality Date   ARTHROSCOPIC HAGLUNDS REPAIR     BREAST CYST EXCISION Right 11/21/2012   Procedure: CYST EXCISION BREAST;  Surgeon: Sherlean JINNY Laughter, MD;  Location: [facility];  Service: General;  Laterality: Right;   BREAST CYST EXCISION Bilateral 01/18/2022   Procedure: EXCISION OF SEBACEOUS CYST BILATERAL BREAST;  Surgeon: Vanderbilt Ned, MD;  Location: [facility];  Service: General;  Laterality: Bilateral;   BREAST EXCISIONAL BIOPSY Right 11/2012   CESAREAN SECTION  04/19/1993   CHOLECYSTECTOMY     COLONOSCOPY   05/02/2011   Procedure: COLONOSCOPY;  Surgeon: Lynwood LITTIE Celestia Mickey., MD;  Location: [facility];  Service: Endoscopy;  Laterality: N/A;   colonoscopy  11/04/2014   DENTAL SURGERY  03/06/2018   2 surgeries ( 03/06/2018 and 04/06/2018)   to remove two separate benign tumors   ESOPHAGEAL MANOMETRY N/A 11/04/2015   Procedure: ESOPHAGEAL MANOMETRY (EM);  Surgeon: Lynwood Celestia, MD;  Location: [facility];  Service: Endoscopy;  Laterality: N/A;   ESOPHAGOGASTRODUODENOSCOPY  05/02/2011   Procedure: ESOPHAGOGASTRODUODENOSCOPY (EGD);  Surgeon: Lynwood LITTIE Celestia Mickey., MD;  Location: [facility];  Service: Endoscopy;  Laterality: N/A;   ESOPHAGOGASTRODUODENOSCOPY N/A 09/01/2014   Procedure: ESOPHAGOGASTRODUODENOSCOPY (EGD);  Surgeon: Lynwood Celestia, MD;  Location: [facility];  Service: Endoscopy;  Laterality: N/A;   IR CV LINE INJECTION  09/13/2023   IR IMAGING GUIDED PORT INSERTION  07/06/2023   KNEE SURGERY     LAPAROSCOPY     x 4   LESION REMOVAL N/A 01/18/2022   Procedure: EXCISION OF SEBACEOUS CYSTS CHEST AND NECK;  Surgeon: Vanderbilt Ned, MD;  Location: [facility];  Service: General;  Laterality: N/A;   PH IMPEDANCE STUDY N/A 11/04/2015   Procedure: PH IMPEDANCE STUDY;  Surgeon: Lynwood Celestia, MD;  Location: [facility];  Service: Endoscopy;  Laterality: N/A;   VAGINAL HYSTERECTOMY  2010   TVH--ovaries remain   Patient Active Problem List   Diagnosis Date Noted   Sjogren syndrome with keratoconjunctivitis 12/09/2022   Other hemochromatosis 06/10/2021   Elevated LFTs 04/04/2021   Colitis 04/04/2021   Bacterial overgrowth syndrome 05/21/2020   Obesity 05/21/2020   History of endometriosis 05/21/2020   Constipation due to outlet dysfunction 02/05/2020   Gastroesophageal reflux disease 02/05/2020   Obstructive sleep apnea treated with continuous positive airway pressure (CPAP) 06/16/2017   Perennial allergic rhinitis with a nonallergic component 01/31/2017   Asthmatic bronchitis  01/31/2017   GI symptoms 01/31/2017   Family history of colon cancer 01/27/2015   Chest pain 12/13/2012   Dyspnea 12/13/2012   DVT of lower extremity (deep venous thrombosis) (HCC) 01/03/1990    PCP: Elliot Charm, MD   REFERRING PROVIDER: Huey Redell Fallow, PA-C  REFERRING DIAG: M54.2 (ICD-10-CM) - Cervicalgia  THERAPY DIAG:  Cervicalgia  Muscle weakness (generalized)  Rationale for Evaluation and Treatment: Rehabilitation  ONSET DATE: July 2025  SUBJECTIVE:                                                                                                                                                                                                         SUBJECTIVE STATEMENT: Got results of MRI of cervical region. See below.    Eval: Reports onset of L UT pain following port placement into R shoulder Hand dominance: Right  PERTINENT HISTORY:  07/21/2023 Assessment: - suspect left-sided cervical/ trapezius muscle spasm - reassured by absence of red flag signs/ symptoms at this time   Concurrent lymphedema PAIN:  Are you having pain? Yes: NPRS scale: 5/10 Pain location: L UT Pain description: ache, cramp Aggravating factors: activity Relieving factors: heat, medication  PRECAUTIONS: None  RED FLAGS: None     WEIGHT BEARING RESTRICTIONS: No  FALLS:  Has patient fallen in last 6 months? No  OCCUPATION: [employer] Employee  PLOF: Independent  PATIENT GOALS: To manage my neck pain  NEXT MD VISIT: TBD  OBJECTIVE:  Note: Objective measures were completed at Evaluation unless otherwise noted.  DIAGNOSTIC FINDINGS:  Oct 2025 C3-4 mod bulging disc osteophyte C4-5 mod to severe bulging disc osteophyte C5-6 severe bulging osteophyte C6-7 minimal retrolisthesis of c6 and C7 with severe bulging disc T2-33mod left disc protrusion  subarticular  PATIENT SURVEYS:  NDI: 28/50 56% perceived disability  Minimum Detectable Change (90%  confidence): 5 points or 10% points  10/03/23:  NDI  30%   The Patient-Specific Functional Scale  Initial:  I am going to ask you to identify up to 3 important activities that you are unable to do or are having difficulty with as a result of this problem.  Today are there any activities that you are unable to do or having difficulty with because of this?  (Patient shown scale and patient rated each activity)  Follow up: When you first came in you had difficulty performing these activities.  Today do you still have difficulty?  Patient-Specific activity scoring scheme (Point to one number):  0 1 2 3 4 5 6 7 8 9  10 Unable                                                                                                          Able to perform To perform                                                                                                    activity at the same Activity         Level as before                                                                                                                       Injury or problem  Activity       Computer work 1 hour                                     9/30: 5                       2.          Cooking                                                           9/30: 5  3.         Sit and watch TV                                              9/30: 5                                                                         POSTURE: rounded shoulders and forward head  PALPATION: Global tenderness throughout B UT and posterior shoulder girdle   CERVICAL ROM:   Active ROM A/PROM (deg) eval 9/30  Flexion 75%P! 47  Extension 75%P! 45  Right lateral flexion 75%P! 36  Left lateral flexion 75%P! 29  Right rotation 75%P! 35  Left rotation 75%P! 29   (Blank rows = not tested)  UPPER EXTREMITY ROM:  Active ROM Right eval Left eval 9/30  Shoulder flexion 150 150 L 155   R 151  Shoulder extension     Shoulder abduction 150 150 L 167 R 161  Shoulder adduction     Shoulder extension     Shoulder internal rotation     Shoulder external rotation     Elbow flexion     Elbow extension     Wrist flexion     Wrist extension     Wrist ulnar deviation     Wrist radial deviation     Wrist pronation     Wrist supination      (Blank rows = not tested)  UPPER EXTREMITY MMT:  MMT Right eval Left eval 9/30  Shoulder flexion     Shoulder extension     Shoulder abduction     Shoulder adduction     Shoulder extension     Shoulder internal rotation     Shoulder external rotation     Middle trapezius   Bil 4-/5  Lower trapezius   Bil 4-/5  Elbow flexion     Elbow extension     Wrist flexion     Wrist extension     Wrist ulnar deviation     Wrist radial deviation     Wrist pronation     Wrist supination     Grip strength      (Blank rows = not tested)  CERVICAL SPECIAL TESTS:  Neck flexor muscle endurance test: Negative 9/30 decreased cervical extensor strength 4-/5  FUNCTIONAL TESTS:  N/A  TREATMENT  10/25/2023 Discussed results of recent cervical MRI Postural alignment and how that affects load on cervical spine Discussed benefit of strengthening cervical deep cervical flexors for stabilization Standing green band with handles: bil shoulder extension with cue for deep cervical flexion activation Supine deep cervical nods 5-10 sec holds (Added to HEP- see below) Seated deep cervical activation (Added to HEP- see below) Manual: seated soft tissue mobilization to right > left cervical paraspinals and suboccipitals; grade 2 cervical distraction 3x 10; avoided right upper trap soft tissue mob secondary to blood clot in right port          10/17/2023 Manual: seated soft tissue mobilization to right > left cervical paraspinals and suboccipitals; grade 2 cervical distraction 3x  10; avoided right upper trap soft tissue mob secondary to blood clot in  right port Attempted thoracic extension exercises against small ball put patient had increased abdominal pain that radiated into her back Seated TA activation + ball squeeze x 20  Seated scap squeeze with light band around wrist 2 x 8  Seated shoulder abduction against light band + shoulder flexion 2 x 8 Chin tuck 2 x 10 Seated shoulder ER with light loop 2 x 8  Standing QL stretch x 8 each side Attempted foam roll roll up at [facility] but this was painful for patient's side Foam roll along patient's spine at wall + snow angel x 8 Seated scalene  stretch 2 x 20sec bilateral     10/11/2023 Update on status and appointment from neck specialist Manual: seated soft tissue mobilization to right > left cervical paraspinals and suboccipitals; grade 2 cervical distraction 3x 10; avoided right upper trap soft tissue mob secondary to blood clot in right port Seated thoracic extension against small blue ball (minimal cervical movement ) x 10 then thoracic rotation x 10 each side Seated shoulder abduction against blue loop x 10  Seated shoulder abduction + scap squeeze with blue loop around wrist 2 x 8 Seated thoracic rotation sitting on plinth x 4 each direction                                                                                                         PATIENT EDUCATION:  Education details: Discussed eval findings, rehab rationale and POC and patient is in agreement  Person educated: Patient Education method: Explanation and Handouts Education comprehension: verbalized understanding and needs further education  HOME EXERCISE PROGRAM: Access Code: [email or web address] URL: [email or web address] Date: 10/25/2023 Prepared by: Glade Pesa  Exercises - Seated Shoulder Shrugs  - 2 x daily - 5 x weekly - 2 sets - 10 reps - Seated Scapular Retraction  - 2 x daily - 5 x weekly - 2 sets - 10 reps - Gentle Levator Scapulae Stretch  - 1 x daily - 7 x weekly - 1 sets - 2 reps - 30 seconds  hold - Seated Cervical Sidebending Stretch  - 1 x daily - 7 x weekly - 1 sets - 2 reps - 30 seconds hold - Quadricep Stretch with Chair and Counter Support  - 1 x daily - 7 x weekly - 2 sets - 20-30s hold - Standing Hamstring Stretch with Step  - 1 x daily - 7 x weekly - 2 sets - 20-30s hold - Seated Hamstring Stretch  - 1 x daily - 7 x weekly - 2 sets - 20-30 hold - Seated Hip Flexor Stretch  - 1 x daily - 7 x weekly - 2 sets - 20-30 hold - Seated Scalene Stretch with Towel  - 1 x daily - 7 x weekly - 2 sets - 20 reps - Seated Modified Small Range Crunch in Chair  - 1 x daily - 7 x weekly - 1-2 sets - 10 reps - Standing Marching  - 1 x daily -  7 x weekly - 1 sets - 10 reps - Median Nerve Flossing - Tray  - 1 x daily - 7 x weekly - 1 sets - 10 reps - Seated Cervical Traction  - 1 x daily - 7 x weekly - 1 sets - 10 reps - Seated Neck and Traction  - 1 x daily - 7 x weekly - 1 sets - 10 reps - Standing Low Shoulder Row with Anchored Resistance  - 1 x daily - 7 x weekly - 1 sets - 10 reps - Seated Thoracic Flexion and Rotation with Arms Crossed  - 1 x daily - 7 x weekly - 1 sets - 5 reps - Seated Thoracic Lumbar Extension  - 1 x daily - 7 x weekly - 1 sets - 5-10 reps - Supine Head Nod with Deep Neck Flexor Activation  - 1 x daily - 7 x weekly - 1 sets - 10 reps - Seated Deep Neck Flexor Nods  - 1 x daily - 7 x weekly - 1 sets - 10 reps ASSESSMENT:  CLINICAL IMPRESSION: MRI results indicate multi level cervical spine degenerative changes both disc and osteophytes. Therapist providing education on the benefit of postural alignment to reduce load.  Patient instructed in deep cervical stabilization ex's with cues to avoid SCM and scalene activation.  She reports relief from cervical distraction and may benefit from cervical supine traction.  Therapist monitoring response to all interventions and modifying treatment accordingly.     OBJECTIVE IMPAIRMENTS: decreased activity tolerance, decreased  mobility, decreased ROM, increased fascial restrictions, increased muscle spasms, impaired UE functional use, postural dysfunction, and pain.   ACTIVITY LIMITATIONS: carrying, lifting, bending, bed mobility, and reach over head  PERSONAL FACTORS: Age, Fitness, Past/current experiences, and 1 comorbidity: lymphedema are also affecting patient's functional outcome.   REHAB POTENTIAL: Good  CLINICAL DECISION MAKING: Stable/uncomplicated  EVALUATION COMPLEXITY: Low   GOALS: Goals reviewed with patient? No    SHORT TERM GOALS=LONG TERM GOALS: Target date: 12/26/2023  Patient to demonstrate independence in HEP  Baseline: [email or web address] Goal status: ongoing  2.  Patient will acknowledge 4/10 pain at least once during episode of care   Baseline: 8/10 Goal status: ongoing  3.  Patient will score at least 20/50 on NDI to signify clinically meaningful improvement in functional abilities.   Baseline: 28/50 Goal status:  met 9/30  4.  160d AROM in B shoulder flexion/abduction Baseline: 150d Goal status: ongoing  5.  Improved cervical rotation ROM to 45 degrees needed for driving revised Goal status:   6. Be able to walk 1 hour or navigate hills at [facility] new  7. PSFS rating for computer work improved to 6-7 new  8. PSFS rating for cooking improved to 6 new   PLAN:  PT FREQUENCY: 1x/week  PT DURATION: 12 weeks   PLANNED INTERVENTIONS: 97110-Therapeutic exercises, 97530- Therapeutic activity, 97112- Neuromuscular re-education, 97535- Self Care, and 02859- Manual therapy  PLAN FOR NEXT SESSION  consider Saunders cervical traction; deep cervical flexor strengthening;  postural strengthening; manual techniques as appropriate  Glade Pesa, PT 10/25/23 1:52 PM Phone: [phone] Fax: [phone]

## 2023-10-26 ENCOUNTER — Other Ambulatory Visit (HOSPITAL_COMMUNITY): Payer: Self-pay

## 2023-10-26 ENCOUNTER — Ambulatory Visit (HOSPITAL_COMMUNITY)
Admission: RE | Admit: 2023-10-26 | Discharge: 2023-10-26 | Disposition: A | Source: Ambulatory Visit | Attending: Otolaryngology

## 2023-10-26 ENCOUNTER — Ambulatory Visit (HOSPITAL_COMMUNITY)
Admission: RE | Admit: 2023-10-26 | Discharge: 2023-10-26 | Disposition: A | Discharge status: Home / Self Care | Source: Ambulatory Visit | Attending: Internal Medicine | Admitting: Internal Medicine

## 2023-10-26 ENCOUNTER — Encounter: Payer: Self-pay | Admitting: Hematology & Oncology

## 2023-10-26 ENCOUNTER — Ambulatory Visit (HOSPITAL_COMMUNITY)
Admission: RE | Admit: 2023-10-26 | Discharge: 2023-10-26 | Disposition: A | Source: Ambulatory Visit | Attending: Internal Medicine | Admitting: Internal Medicine

## 2023-10-26 ENCOUNTER — Other Ambulatory Visit (INDEPENDENT_AMBULATORY_CARE_PROVIDER_SITE_OTHER): Payer: Self-pay

## 2023-10-26 DIAGNOSIS — R1310 Dysphagia, unspecified: Secondary | ICD-10-CM | POA: Diagnosis not present

## 2023-10-26 DIAGNOSIS — K449 Diaphragmatic hernia without obstruction or gangrene: Secondary | ICD-10-CM | POA: Insufficient documentation

## 2023-10-26 DIAGNOSIS — K209 Esophagitis, unspecified without bleeding: Secondary | ICD-10-CM | POA: Diagnosis not present

## 2023-10-26 DIAGNOSIS — R059 Cough, unspecified: Secondary | ICD-10-CM | POA: Diagnosis not present

## 2023-10-26 NOTE — Progress Notes (Signed)
 Modified Barium Swallow Study  Patient Details  Name: Karen [name] MRN: [id number] Date of Birth: [birth year]  Today's Date: 10/26/2023  Modified Barium Swallow completed.  Full report located under Chart Review in the Imaging Section.  History of Present Illness Karen [name] is a 53 year old female with hx of eosinophilic esophagitis and rheumatoid arthritis who was referred for an OP MBS per ENT, Dr. Soldatova. Per MD notes: She experiences increased episodes of choking with both liquids and solids, occurring a couple of times a week. She describes a sensation of 'choking' and 'strangling' when swallowing, which has become more frequent. She continues to use Flonase  and Prevacid  30 mg once daily for heartburn, initially prescribed for eosinophilic esophagitis. An endoscopy and colonoscopy performed on May 22 showed no signs of eosinophilic esophagitis at that time. Hx EGDs and dilations.MRI scheduled to assess cervical spine for bone spurs or vertebral narrowing. Differential includes esophageal dysmotility or obstruction by osteophytes vs OP/esophageal dysphagia due to other reasons. Scope showed no masses, intact vocal cord movement.       Today pt adds that the coughing occurs during and outside of meals and can be severe. She has tried low acid diets in the past without significant impact.   Clinical Impression Karen [name] presents with a normal oropharyngeal swallow.  Mastication was thorough with strong bolus propulsion into pharynx.  There was timely swallow onset and reliable laryngeal vestibule closure with no penetration/aspiration. Pharyngeal clearance was normal; UES opening was patent.  No obvious etiology of coughing/choking episodes. Discussed MBS video in real time and reviewed results. Pt will be following up with ENT. Factors that may increase risk of adverse event in presence of aspiration Karen [name] & Karen [name] 2021):    Swallow Evaluation Recommendations Recommendations: PO  diet PO Diet Recommendation: Regular;Thin liquids (Level 0) Liquid Administration via: Cup;Straw Medication Administration: Whole meds with liquid Supervision: Patient able to self-feed Oral care recommendations: Oral care BID (2x/day)     Karen [name] L. Vona, MA CCC/SLP Clinical Specialist - Acute Care SLP Acute Rehabilitation Services Office number [phone]  Karen [name] 10/26/2023,12:39 PM

## 2023-10-27 ENCOUNTER — Encounter: Payer: Self-pay | Admitting: Hematology & Oncology

## 2023-10-27 ENCOUNTER — Other Ambulatory Visit (HOSPITAL_COMMUNITY): Payer: Self-pay

## 2023-10-27 DIAGNOSIS — E669 Obesity, unspecified: Secondary | ICD-10-CM | POA: Diagnosis not present

## 2023-10-27 DIAGNOSIS — I890 Lymphedema, not elsewhere classified: Secondary | ICD-10-CM | POA: Diagnosis not present

## 2023-10-27 DIAGNOSIS — Z6834 Body mass index (BMI) 34.0-34.9, adult: Secondary | ICD-10-CM | POA: Diagnosis not present

## 2023-10-27 DIAGNOSIS — M5030 Other cervical disc degeneration, unspecified cervical region: Secondary | ICD-10-CM | POA: Diagnosis not present

## 2023-10-27 DIAGNOSIS — M545 Low back pain, unspecified: Secondary | ICD-10-CM | POA: Diagnosis not present

## 2023-10-27 DIAGNOSIS — M542 Cervicalgia: Secondary | ICD-10-CM | POA: Diagnosis not present

## 2023-10-27 DIAGNOSIS — Z713 Dietary counseling and surveillance: Secondary | ICD-10-CM | POA: Diagnosis not present

## 2023-10-27 DIAGNOSIS — K582 Mixed irritable bowel syndrome: Secondary | ICD-10-CM | POA: Diagnosis not present

## 2023-10-27 MED ORDER — PREDNISONE 5 MG (21) PO TBPK
ORAL_TABLET | ORAL | 1 refills | Status: DC
  Filled 2023-10-27: qty 21, 6d supply, fill #0

## 2023-10-28 ENCOUNTER — Other Ambulatory Visit (HOSPITAL_COMMUNITY): Payer: Self-pay

## 2023-10-30 ENCOUNTER — Other Ambulatory Visit (HOSPITAL_COMMUNITY): Payer: Self-pay

## 2023-10-30 ENCOUNTER — Ambulatory Visit: Admitting: Occupational Therapy

## 2023-10-30 ENCOUNTER — Other Ambulatory Visit (INDEPENDENT_AMBULATORY_CARE_PROVIDER_SITE_OTHER): Payer: Self-pay

## 2023-10-30 DIAGNOSIS — I890 Lymphedema, not elsewhere classified: Secondary | ICD-10-CM

## 2023-10-30 NOTE — Therapy (Signed)
 OUTPATIENT OCCUPATIONAL THERAPY TREATMENT NOTE AND PROGRESS REPORT  BLE/ BLQ LYMPHEDEMA  Patient Name: Karen [name] MRN: [id number] DOB:Sep 05, 1970, 53 y.o., female Today's Date: 10/30/2023  REPORTING PERIOD: 09/18/23 - 10/30/23  END OF SESSION:  OT End of Session - 10/30/23 1232     Visit Number 60    Number of Visits 72    Date for Recertification  11/16/23    OT Start Time 1015    OT Stop Time 1110    OT Time Calculation (min) 55 min    Activity Tolerance Patient tolerated treatment well;No increased pain    Behavior During Therapy [facility] for tasks assessed/performed                Past Medical History:  Diagnosis Date   Adenomyosis    Anemia    Arthritis 2013   L knee gets steroid injections    Arthritis, rheumatic, acute or subacute    Asthmatic bronchitis 01/31/2017   Back pain    Chronic constipation    Diarrhea    DVT of lower extremity (deep venous thrombosis) (HCC)    Dysmenorrhea    Endometriosis    Esophageal stricture    G6PD deficiency    GERD (gastroesophageal reflux disease)    History of kidney stones    History of uterine fibroid    IBS (irritable bowel syndrome)    Interstitial cystitis    Lactose intolerance    Migraine    with aura   Other hemochromatosis 06/10/2021   PONV (postoperative nausea and vomiting)    Recurrent upper respiratory infection (URI)    Sebaceous cyst of breast, right lower inner quadrant 10/25/2012   Excised 11/21/12    Sleep apnea    Syncope, non cardiac    Past Surgical History:  Procedure Laterality Date   ARTHROSCOPIC HAGLUNDS REPAIR     BREAST CYST EXCISION Right 11/21/2012   Procedure: CYST EXCISION BREAST;  Surgeon: Sherlean JINNY Laughter, MD;  Location: [facility];  Service: General;  Laterality: Right;   BREAST CYST EXCISION Bilateral 01/18/2022   Procedure: EXCISION OF SEBACEOUS CYST BILATERAL BREAST;  Surgeon: Vanderbilt Ned, MD;  Location: [facility];  Service: General;   Laterality: Bilateral;   BREAST EXCISIONAL BIOPSY Right 11/2012   CESAREAN SECTION  04/19/1993   CHOLECYSTECTOMY     COLONOSCOPY  05/02/2011   Procedure: COLONOSCOPY;  Surgeon: Lynwood LITTIE Celestia Mickey., MD;  Location: [facility];  Service: Endoscopy;  Laterality: N/A;   colonoscopy  11/04/2014   DENTAL SURGERY  03/06/2018   2 surgeries ( 03/06/2018 and 04/06/2018)   to remove two separate benign tumors   ESOPHAGEAL MANOMETRY N/A 11/04/2015   Procedure: ESOPHAGEAL MANOMETRY (EM);  Surgeon: Lynwood Celestia, MD;  Location: [facility];  Service: Endoscopy;  Laterality: N/A;   ESOPHAGOGASTRODUODENOSCOPY  05/02/2011   Procedure: ESOPHAGOGASTRODUODENOSCOPY (EGD);  Surgeon: Lynwood LITTIE Celestia Mickey., MD;  Location: [facility];  Service: Endoscopy;  Laterality: N/A;   ESOPHAGOGASTRODUODENOSCOPY N/A 09/01/2014   Procedure: ESOPHAGOGASTRODUODENOSCOPY (EGD);  Surgeon: Lynwood Celestia, MD;  Location: [facility];  Service: Endoscopy;  Laterality: N/A;   IR CV LINE INJECTION  09/13/2023   IR IMAGING GUIDED PORT INSERTION  07/06/2023   KNEE SURGERY     LAPAROSCOPY     x 4   LESION REMOVAL N/A 01/18/2022   Procedure: EXCISION OF SEBACEOUS CYSTS CHEST AND NECK;  Surgeon: Vanderbilt Ned, MD;  Location: [facility];  Service: General;  Laterality: N/A;  PH IMPEDANCE STUDY N/A 11/04/2015   Procedure: PH IMPEDANCE STUDY;  Surgeon: Lynwood Bohr, MD;  Location: [facility];  Service: Endoscopy;  Laterality: N/A;   VAGINAL HYSTERECTOMY  2010   TVH--ovaries remain   Patient Active Problem List   Diagnosis Date Noted   Sjogren syndrome with keratoconjunctivitis 12/09/2022   Other hemochromatosis 06/10/2021   Elevated LFTs 04/04/2021   Colitis 04/04/2021   Bacterial overgrowth syndrome 05/21/2020   Obesity 05/21/2020   History of endometriosis 05/21/2020   Constipation due to outlet dysfunction 02/05/2020   Gastroesophageal reflux disease 02/05/2020   Obstructive sleep apnea treated with continuous  positive airway pressure (CPAP) 06/16/2017   Perennial allergic rhinitis with a nonallergic component 01/31/2017   Asthmatic bronchitis 01/31/2017   GI symptoms 01/31/2017   Family history of colon cancer 01/27/2015   Chest pain 12/13/2012   Dyspnea 12/13/2012   DVT of lower extremity (deep venous thrombosis) (HCC) 01/03/1990    PCP: Gaile New, MD  REFERRING PROVIDER: Valery Ripple, MD  REFERRING DIAG: I89.0  THERAPY DIAG:  Lymphedema, not elsewhere classified  Rationale for Evaluation and Treatment: Rehabilitation  ONSET DATE: ~2014; exacerbation in 2023  SUBJECTIVE:                                                                                                                                                                                          SUBJECTIVE STATEMENT:  Pt presents for OT to address lower quadrant, including deep abdominal lymphatics, and LLE lymphedema w/ associated pain. Pt denies LE related pain today. Pt reports her rheumatologist stands by her RA diagnosis, and that multiple health issues se is experiencing are unrelated.  Pt was told she has spinal stenosis that may be causing ortho and neurological symptoms.  PERTINENT HISTORY: CVI, OSA (no CPAP), GERD, Esophageal stricture, IBS, Lactose intolerant, Diarrhea, Hx LE DVT, recent RA dx  PAIN:  Are you having pain? Yes: 0/10 Pain location: abdomen; digestive discomfort Pain description: abdominal fullness, tightness Aggravating factors: standing, walking,  Relieving factors: MLD, Miralax ,   PRECAUTIONS: Other: LYMPHEDEMA; Hx LLE DVT  WEIGHT BEARING RESTRICTIONS: No  FALLS:  Has patient fallen in last 6 months? No  LIVING ENVIRONMENT: Lives with: lives with spouse and daughter Lives in: House/apartment Stairs: Yes; Internal: 14 steps; yes and External: 4 steps; yes Has following equipment at home: None  OCCUPATION: [employer] Services Operational Officer, walking, desk work  LEISURE: family time  HAND  DOMINANCE: right   PRIOR LEVEL OF FUNCTION: Independent  PATIENT GOALS: Feel better, be able to be more active without pain, wear preferred street shoes. NEW 08/03/23: Be able to manage digestive discomfort and  gut inflammation symptoms without medications  OBJECTIVE: Note: Objective measures were completed at Evaluation unless otherwise noted.  COGNITION:  Overall cognitive status: Within functional limits for tasks assessed   POSTURE: WFL  LE ROM: WFL  LE MMT: WFL  LYMPHEDEMA ASSESSMENTS: non-Ca related  INFECTIONS: denies cellulitis and wound hx  BLE COMPARATIVE LIMB VOLUMETRICS Initial 11/15/22  LANDMARK RIGHT (dominant)  R LEG (A-D) 3028.9 ml  R THIGH (E-G) 4809.60 ml  R FULL LIMB (A-G) 7838.5 ml  Limb Volume differential (LVD)  %  Volume change since initial %  Volume change overall V  (Blank rows = not tested)  LANDMARK LEFT  (Rx)  L LEG (A-D) 3189.9 ml  L THIGH (E-G) 4959.8 ml  L FULL LIMB (A-G) 8149.7 ml  Limb Volume differential (LVD)  LEG LVD = 5.32%, L>R; THIGH LVD = 3.12%, L>R; And full LLE LVD = 3.97%, L>R.   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  Skin  Description Hyper-Keratosis Peau d' Orange Shiny Tight Fibrotic/ Indurated Fatty Doughy Spongy/ boggy       R>L x  x   Skin dry Flaky WNL Macerated   mildly      Color Redness Varicosities Blanching Hemosiderin Stain Mottled        x   Odor Malodorous Yeast Fungal infection  WNL      x   Temperature Warm Cool wnl     x    Pitting Edema   1+ 2+ 3+ 4+ Non-pitting         x   Girth  Symmetrical Asymmetrical                   Distribution    L>R toes to groin    Stemmer Sign Positive Negative   +L -R   Lymphorrhea History Of:  Present Absent     x    Wounds History Of Present Absent Venous Arterial Pressure Sheer     x        Signs of Infection Redness Warmth Erythema Acute Swelling Drainage Borders                    Sensation Light Touch Deep pressure Hypersensitivity   In tact Impaired In tact Impaired Absent Impaired   x  x  x     Nails WNL   Fungus nail dystrophy   x     Hair Growth Symmetrical Asymmetrical   x    Skin Creases Base of toes  Ankles   Base of Fingers knees       Abdominal pannus Thigh Lobules  Face/neck   x           GAIT: Distance walked: >500' Assistive device utilized: None Level of assistance: Complete Independence Comments: Functional ambulation and transfers [facility]  LYMPHEDEMA LIFE IMPACT SCALE (LLIS): Initial 11/15/22  50%  FOTO functional outcome measure: Deferred. FOTO discontinued.  TODAY'S TREATMENT:  MLD to abdominal lymphatics Pt edu for LE self care  PATIENT EDUCATION:  Continued Pt/ CG edu for how function of cisterna chyli, part of the thoracic duct of deep abdominal lymphatics, assists with digestion by filtering many of the fats and proteins from the digestive system. Sub optimal lymphatic flow in this region may result in constipation, bloating, swelling, etc. MLD helps mobilize these protein and fats , and the rhythmic movement of manual lymphatic drainage stimulates digestive muscles and increase peristalsis. Provided printed resource for reference.  Topics include outcome of comparative limb volumetrics- starting limb volume differentials (LVDs), technology and gradient techniques used for short stretch, multilayer compression wrapping, simple self-MLD, therapeutic  lymphatic pumping exercises, skin/nail care, LE precautions,. compression garment recommendations and specifications, wear and care schedule and compression garment donning / doffing w assistive devices. Discussed progress towards all OT goals since commencing CDT. All questions answered to the Pt's satisfaction. Good return. Person educated: Patient  Education method: Explanation, Demonstration, and Handouts Education comprehension: verbalized understanding, returned demonstration, verbal cues required, and needs further education  [facility] PRECAUTIONS: Pt education re precautions related to use of advanced sequential pneumatic compression device. Pt verbalized understanding that she should never use device on 2 arms/legs simultaneously and should not use device 2 x on same day to avoid overloading her heart with fluid volume return both at present, and in years to come should her medical condition change. Pt instructed to remove device immediately should she experience atypical SOP, light headedness, or acute pain. Pt instructed to discontinue pump if she suspects, or has any infection, blood clot, cellulitis, the flu, corona virus, etc.   HOME EXERCISE PROGRAM: BLE lymphatic pumping there ex using- 1 sets of 10 reps, each exercise in order-  1-2 x daily, bilaterally Simple self MLD 1 x daily Daily skin care to increase hydration, skin mobility and decrease infection risk- can be done during MLD Compression wraps 23/7 during intensive phase of CDT Compression garments/ devices during self-management phase of CDT  ASSESSMENT:  CLINICAL IMPRESSION: Pls review GOALS section below for additional details regarding progress towards goals. Pt understands and can apply lymphedema precautions after skilled training. Pt remains 100% compliant with lymphedema self care. Because lymphatic massage to one's own deep abdomen is challenging, especially with orthopedic issues Pt has had this year, Pt's daughter  is learning to assist her mom with simple self MLD. Pt has also undergone a trial of the advanced Flexitouch PLUS sequential pneumatic compression device which features an abdominal, truncal, buttocks component above the level of the inguinal LN to facilitate fluid congestion towards the thoracic duct and terminus. Pt felt this helpful to her during the trial and she's hoping for insurance approval. Pt has obtained the recommended knee length compression garment and wears it for prolonged walking and dependent positioning. Limb volume measurement in the lower extremity for comparison was not performed today due to time constraints, but by visual assessment it appears unchanged. Abdominal swelling and tightness is also unchanged by visual assessment and palpation. Pt has reached a clinical plateau and he agrees with plan to taper treatment to transition to LE self management phase of CDT. After her next session we'll reduce OT for every other week for a couple of weeks, then further reduce PRN.   Pt tolerated deep abdominal MLD with short neck sequence since port tenderness and R lateral neck swelling is resolved this visit. No SOB, dizziness, or increased pain observed or reported during session. Pt continues to have  digestive discomfort associated with chronic constipation alternating with diarrhea.  Pt tolerated the abdominal sequence as established without increased pain. Pt continues to  benefit from MLD for enhancing gut motility and bowel function.  MLD is also known to facilitate the removal of inflammatory substances and toxins from the intestinal area, which may help reduce inflammation linked to digestive disorders.   Cont  1 x weekly , then reduce OT to every other month to wean frequency and facilitate transition to self management  with follow along PRN.  11/15/22 Initial Evaluation: Brexley Cutshaw is a 53 y.o. female presenting with very mild, stage II, LLE lymphedema 2/2 venous insufficiency  and obesity. L Leg swelling and associated pain swelling fluctuates. It has progressed over time, and now no longer resolves with elevation over night. RLE swelling limits balance during functional ambulation. It exacerbates infection and falls risk. LLE/RLQ lymphedema interferes with functional performance in all occupational domains, including basic and instrumental ADLs, productive activities, leisure pursuits and social participation. Pt will benefit from Occupational Therapy for Complete Decongestive Therapy (CDT) to restore function, to reduce physical and psychologic suffering associated with chronic, progressive lymphedema and associated pain, and to limit infection. CDT will include manual lymphatic drainage (MLD), skin care, therapeutic exercise and compression wraps and garment's devices. Without skilled OT for lymphedema care the condition will worsen and further functional decline is expected  Custom-made gradient compression garments and HOS devices are medically necessary because they are uniquely sized and shaped to fit the exact dimensions of the affected extremities, and to provide appropriate medical grade, graduated compression essential for optimally managing chronic, progressive lymphedema. Multiple custom compression garments are needed to ensure proper hygiene to limit infection risk. Custom compression garments should be replaced q 3-6 months When worn consistently for optimal lipo-lymphedema self-management over time. HOS devices, medically necessary to limit fibrosis buildup in tissue, should be replaced q 2 years and PRN when worn out.      OBJECTIVE IMPAIRMENTS: decreased activity tolerance, decreased knowledge of condition, decreased knowledge of use of DME, increased edema, impaired sensation, pain, and chronic, progressive leg swelling.   ACTIVITY LIMITATIONS: dependent sitting, extended standing and/ or walking, squatting, and lower body dressing, fitting preferred street  shoes  PARTICIPATION LIMITATIONS: shopping, community activity, occupation, and yard work  PERSONAL FACTORS: 3+ comorbidities: Hx DVT,  OSA, CVI. Varicose veins are also affecting patient's functional outcome.   REHAB POTENTIAL: Good  EVALUATION COMPLEXITY: Moderate   GOALS: Goals reviewed with patient? Yes  SHORT TERM GOALS: Target date: 4th OT Rx visit  SHORT TERM GOALS: Target date: 4th OT Rx visit   Pt will demonstrate understanding of lymphedema precautions and prevention strategies with modified independence using a printed reference to identify at least 5 precautions and discussing how s/he may implement them into daily life to reduce risk of progression with extra time. Baseline:Max A Goal status: GOAL MET   Pt will be able to apply multilayer, knee length, gradient, compression wraps to one leg at a time with modified assistance (extra time and assistive device/s) to decrease limb volume, to limit infection risk, and to limit lymphedema progression.  Baseline: Dependent Goal status: GOAL DISCHARGED. Pt Going to compression garments instead  LONG TERM GOALS: Target date: 02/08/23  Given this patient's Intake score of 67%/100% on the functional outcomes FOTO tool, patient will experience an increase in function of 5 points to improve basic and instrumental ADLs performance, including lymphedema self-care.  Baseline: Max A Goal status:GOAL DISCHARGED.  FOTO TOOL DISCONTINUED AT CLINIC   Given this patient's Intake score of 50% on the Lymphedema Life Impact Scale (LLIS), patient will experience a reduction of at least 5 points in her perceived level of functional impairment resulting from lymphedema to improve functional performance and quality of life (QOL). Baseline: 50% Goal status: PROGRESSING  Pt will achieve at least a 10% volume reduction in B legs to return limb to typical size and shape, to limit infection risk and LE progression, to decrease pain, to improve  function. Baseline: Dependent Goal status: PROGRESSING  4.  Pt will obtain proper compression garments/devices and achieve modified independence (extra time + assistive devices) with donning/doffing to optimize limb volume reductions and limit LE  progression over time. Baseline:  Goal status:GOAL MET  5.  During Intensive phase CDT , with modified independence, Pt will achieve at least 85% compliance with all lymphedema self-care home program components, including daily skin care, compression wraps and /or garments, simple self MLD and lymphatic pumping therex to habituate LE self care protocol  into ADLs for optimal LE self-management over time. Baseline: Dependent Goal status:GOAL MET   PLAN:  OT FREQUENCY: 1 /week  OT DURATION: 12 weeks and PRN  PLANNED INTERVENTIONS: 97110-Therapeutic exercises, 97530- Therapeutic activity, 97535- Self Care, [id number]- Manual therapy, Patient/Family education, Manual lymph drainage, Compression bandaging, and fit with appropriate compression garments  PLAN FOR NEXT SESSION:  Progress note Cont MLD as established Pt and family edu for LE self-care  Zebedee Dec, MS, OTR/L, CLT-LANA 10/30/23 12:37 PM

## 2023-10-31 ENCOUNTER — Telehealth (INDEPENDENT_AMBULATORY_CARE_PROVIDER_SITE_OTHER): Payer: Self-pay | Admitting: Otolaryngology

## 2023-10-31 ENCOUNTER — Ambulatory Visit: Admitting: Physical Therapy

## 2023-10-31 ENCOUNTER — Other Ambulatory Visit (HOSPITAL_COMMUNITY): Payer: Self-pay

## 2023-10-31 ENCOUNTER — Encounter: Payer: Self-pay | Admitting: Physical Therapy

## 2023-10-31 DIAGNOSIS — M542 Cervicalgia: Secondary | ICD-10-CM | POA: Diagnosis not present

## 2023-10-31 DIAGNOSIS — M6281 Muscle weakness (generalized): Secondary | ICD-10-CM | POA: Diagnosis not present

## 2023-10-31 DIAGNOSIS — M5459 Other low back pain: Secondary | ICD-10-CM | POA: Diagnosis not present

## 2023-10-31 DIAGNOSIS — M62838 Other muscle spasm: Secondary | ICD-10-CM

## 2023-10-31 MED ORDER — FLUTICASONE PROPIONATE 50 MCG/ACT NA SUSP
2.0000 | Freq: Every day | NASAL | 5 refills | Status: AC
  Filled 2023-10-31: qty 16, 30d supply, fill #0
  Filled 2023-11-23 – 2023-11-24 (×2): qty 16, 30d supply, fill #1
  Filled 2023-12-21: qty 16, 30d supply, fill #2
  Filled 2024-01-24: qty 16, 30d supply, fill #3

## 2023-10-31 NOTE — Patient Instructions (Signed)
 RE-ALIGNMENT ROUTINE EXERCISES BASIC FOR POSTURAL CORRECTION   RE-ALIGNMENT Tips BENEFITS: 1.It helps to re-align the curves of the back and improve standing posture. 2.It allows the back muscles to rest and strengthen in preparation for more activity. FREQUENCY: Daily, even after weeks, months and years of more advanced exercises. START: 1.All exercises start in the same position: lying on the back, arms resting on the supporting surface, palms up and slightly away from the body, backs of hands down, knees bent, feet flat. 2.The head, neck, arms, and legs are supported according to specific instructions of your therapist. Copyright  [employer]. All rights reserved.    1. Decompression Exercise: Basic. SMALL NOD OF THE HEAD TO ACTIVATE DEEP MUSCLES AND HOLD. LIKE HOLDING A BALL BETWEEN CHIN AND CHEST  Takes compression off the vertebral bodies; increases tolerance for lying on the back; helps relieve back pain   Lie on back on firm surface, knees bent, feet flat, arms turned up, out to sides (~35 degrees). Head neck and arms supported as necessary. Time _5-15__ minutes. Surface: floor     2. Shoulder Press SMALL NOD AND HOLD  Strengthens upper back extensors and scapular retractors.   Press both shoulders down. Hold _2-3__ seconds. Repeat _3-5__ times. Surface: floor        3. Head Press With [employer] SMALL NOD AND HOLD  Strengthens neck extensors   Tuck chin SLIGHTLY toward chest, keep mouth closed. Feel weight on back of head. Increase weight by pressing head down. Hold _2-3__ seconds. Relax. Repeat 3-5___ times. Surface: floor   4. Leg Lengthener: stretches quadratus lumborum and hip flexors.  Strengthens quads and ankle dorsiflexors.

## 2023-10-31 NOTE — Therapy (Signed)
 OUTPATIENT PHYSICAL THERAPY CERVICAL TREATMENT   Patient Name: Karen [name] MRN: [id number] DOB:08/17/1970, 53 y.o., female Today's Date: 10/31/2023  END OF SESSION:  PT End of Session - 10/31/23 1403     Visit Number 14    Date for Recertification  12/26/23    Authorization Type [name] Employee    Authorization Time Period signed dry needling consent form 08/08/23    PT Start Time 1403    PT Stop Time 1441    PT Time Calculation (min) 38 min    Activity Tolerance Patient tolerated treatment well                  Past Medical History:  Diagnosis Date   Adenomyosis    Anemia    Arthritis 2013   L knee gets steroid injections    Arthritis, rheumatic, acute or subacute    Asthmatic bronchitis 01/31/2017   Back pain    Chronic constipation    Diarrhea    DVT of lower extremity (deep venous thrombosis) (HCC)    Dysmenorrhea    Endometriosis    Esophageal stricture    G6PD deficiency    GERD (gastroesophageal reflux disease)    History of kidney stones    History of uterine fibroid    IBS (irritable bowel syndrome)    Interstitial cystitis    Lactose intolerance    Migraine    with aura   Other hemochromatosis 06/10/2021   PONV (postoperative nausea and vomiting)    Recurrent upper respiratory infection (URI)    Sebaceous cyst of breast, right lower inner quadrant 10/25/2012   Excised 11/21/12    Sleep apnea    Syncope, non cardiac    Past Surgical History:  Procedure Laterality Date   ARTHROSCOPIC HAGLUNDS REPAIR     BREAST CYST EXCISION Right 11/21/2012   Procedure: CYST EXCISION BREAST;  Surgeon: Sherlean JINNY Laughter, MD;  Location: [facility];  Service: General;  Laterality: Right;   BREAST CYST EXCISION Bilateral 01/18/2022   Procedure: EXCISION OF SEBACEOUS CYST BILATERAL BREAST;  Surgeon: Vanderbilt Ned, MD;  Location: [facility];  Service: General;  Laterality: Bilateral;   BREAST EXCISIONAL BIOPSY Right  11/2012   CESAREAN SECTION  04/19/1993   CHOLECYSTECTOMY     COLONOSCOPY  05/02/2011   Procedure: COLONOSCOPY;  Surgeon: Lynwood LITTIE Celestia Mickey., MD;  Location: [facility];  Service: Endoscopy;  Laterality: N/A;   colonoscopy  11/04/2014   DENTAL SURGERY  03/06/2018   2 surgeries ( 03/06/2018 and 04/06/2018)   to remove two separate benign tumors   ESOPHAGEAL MANOMETRY N/A 11/04/2015   Procedure: ESOPHAGEAL MANOMETRY (EM);  Surgeon: Lynwood Celestia, MD;  Location: [facility];  Service: Endoscopy;  Laterality: N/A;   ESOPHAGOGASTRODUODENOSCOPY  05/02/2011   Procedure: ESOPHAGOGASTRODUODENOSCOPY (EGD);  Surgeon: Lynwood LITTIE Celestia Mickey., MD;  Location: [facility];  Service: Endoscopy;  Laterality: N/A;   ESOPHAGOGASTRODUODENOSCOPY N/A 09/01/2014   Procedure: ESOPHAGOGASTRODUODENOSCOPY (EGD);  Surgeon: Lynwood Celestia, MD;  Location: [facility];  Service: Endoscopy;  Laterality: N/A;   IR CV LINE INJECTION  09/13/2023   IR IMAGING GUIDED PORT INSERTION  07/06/2023   KNEE SURGERY     LAPAROSCOPY     x 4   LESION REMOVAL N/A 01/18/2022   Procedure: EXCISION OF SEBACEOUS CYSTS CHEST AND NECK;  Surgeon: Vanderbilt Ned, MD;  Location: [facility];  Service: General;  Laterality: N/A;   PH IMPEDANCE STUDY N/A 11/04/2015   Procedure: PH  IMPEDANCE STUDY;  Surgeon: Lynwood Bohr, MD;  Location: [facility];  Service: Endoscopy;  Laterality: N/A;   VAGINAL HYSTERECTOMY  2010   TVH--ovaries remain   Patient Active Problem List   Diagnosis Date Noted   Sjogren syndrome with keratoconjunctivitis 12/09/2022   Other hemochromatosis 06/10/2021   Elevated LFTs 04/04/2021   Colitis 04/04/2021   Bacterial overgrowth syndrome 05/21/2020   Obesity 05/21/2020   History of endometriosis 05/21/2020   Constipation due to outlet dysfunction 02/05/2020   Gastroesophageal reflux disease 02/05/2020   Obstructive sleep apnea treated with continuous positive airway pressure (CPAP) 06/16/2017   Perennial  allergic rhinitis with a nonallergic component 01/31/2017   Asthmatic bronchitis 01/31/2017   GI symptoms 01/31/2017   Family history of colon cancer 01/27/2015   Chest pain 12/13/2012   Dyspnea 12/13/2012   DVT of lower extremity (deep venous thrombosis) (HCC) 01/03/1990    PCP: Elliot Charm, MD   REFERRING PROVIDER: Huey Redell Fallow, PA-C  REFERRING DIAG: M54.2 (ICD-10-CM) - Cervicalgia  THERAPY DIAG:  Cervicalgia  Muscle weakness (generalized)  Other muscle spasm  Rationale for Evaluation and Treatment: Rehabilitation  ONSET DATE: July 2025  SUBJECTIVE:                                                                                                                                                                                                         SUBJECTIVE STATEMENT: Saw Dr. Duwayne and he said I had some pinched nerves in the back and he prescribed a round a steroids.  He may do an MRI for the lumbar.  My neck will swell.  I'm working on the deep muscle strengthening.  My computer chair is really good.  Dr Duwayne wrote order for aquatic PT to include (lumbar). Going to check about port near the water. I got hot flashes daily almost.  Can't go on HRT b/c of blood clot history.     Eval: Reports onset of L UT pain following port placement into R shoulder Hand dominance: Right  PERTINENT HISTORY:  07/21/2023 Assessment: - suspect left-sided cervical/ trapezius muscle spasm - reassured by absence of red flag signs/ symptoms at this time   Concurrent lymphedema PAIN:  Are you having pain? Yes: NPRS scale: 4/10 Pain location: L UT Pain description: ache, cramp Aggravating factors: activity Relieving factors: heat, medication  PRECAUTIONS: None  RED FLAGS: None     WEIGHT BEARING RESTRICTIONS: No  FALLS:  Has patient fallen in last 6 months? No  OCCUPATION: [employer] Employee  PLOF: Independent  PATIENT GOALS: To manage  my neck  pain  NEXT MD VISIT: TBD  OBJECTIVE:  Note: Objective measures were completed at Evaluation unless otherwise noted.  DIAGNOSTIC FINDINGS:  Oct 2025 C3-4 mod bulging disc osteophyte C4-5 mod to severe bulging disc osteophyte C5-6 severe bulging osteophyte C6-7 minimal retrolisthesis of c6 and C7 with severe bulging disc T2-61mod left disc protrusion  subarticular  PATIENT SURVEYS:  NDI: 28/50 56% perceived disability  Minimum Detectable Change (90% confidence): 5 points or 10% points  10/03/23:  NDI  30%   The Patient-Specific Functional Scale  Initial:  I am going to ask you to identify up to 3 important activities that you are unable to do or are having difficulty with as a result of this problem.  Today are there any activities that you are unable to do or having difficulty with because of this?  (Patient shown scale and patient rated each activity)  Follow up: When you first came in you had difficulty performing these activities.  Today do you still have difficulty?  Patient-Specific activity scoring scheme (Point to one number):  0 1 2 3 4 5 6 7 8 9  10 Unable                                                                                                          Able to perform To perform                                                                                                    activity at the same Activity         Level as before                                                                                                                       Injury or problem  Activity       Computer work 1 hour                                     9/30: 5  2.          Cooking                                                           9/30: 5                                                                   3.         Sit and watch TV                                              9/30: 5                                                                          POSTURE: rounded shoulders and forward head  PALPATION: Global tenderness throughout B UT and posterior shoulder girdle   CERVICAL ROM:   Active ROM A/PROM (deg) eval 9/30  Flexion 75%P! 47  Extension 75%P! 45  Right lateral flexion 75%P! 36  Left lateral flexion 75%P! 29  Right rotation 75%P! 35  Left rotation 75%P! 29   (Blank rows = not tested)  UPPER EXTREMITY ROM:  Active ROM Right eval Left eval 9/30  Shoulder flexion 150 150 L 155  R 151  Shoulder extension     Shoulder abduction 150 150 L 167 R 161  Shoulder adduction     Shoulder extension     Shoulder internal rotation     Shoulder external rotation     Elbow flexion     Elbow extension     Wrist flexion     Wrist extension     Wrist ulnar deviation     Wrist radial deviation     Wrist pronation     Wrist supination      (Blank rows = not tested)  UPPER EXTREMITY MMT:  MMT Right eval Left eval 9/30  Shoulder flexion     Shoulder extension     Shoulder abduction     Shoulder adduction     Shoulder extension     Shoulder internal rotation     Shoulder external rotation     Middle trapezius   Bil 4-/5  Lower trapezius   Bil 4-/5  Elbow flexion     Elbow extension     Wrist flexion     Wrist extension     Wrist ulnar deviation     Wrist radial deviation     Wrist pronation     Wrist supination     Grip strength      (Blank rows = not tested)  CERVICAL SPECIAL TESTS:  Neck flexor muscle endurance test: Negative 9/30  decreased cervical extensor strength 4-/5  FUNCTIONAL TESTS:  N/A  TREATMENT  10/31/2023 Discussion of her visit with Dr. Duwayne regarding back pain and his referral to [facility] aquatic [facility] Discussed benefit of strengthening cervical deep cervical flexors for stabilization Supine deep cervical activation with: UE elevation alternating, elbow press down, shoulder press down 5x (Added to HEP- pt instructions)  Decompression series: whole leg lengthening, whole leg press  down 5x 5 sec holds (Added to HEP- pt instructions) Manual:  grade 2 cervical distraction 3x 10; gentle upper trap lengthening right/left; suboccipital release Saunders cervical traction 8# static 3 min  10/25/2023 Discussed results of recent cervical MRI Postural alignment and how that affects load on cervical spine Discussed benefit of strengthening cervical deep cervical flexors for stabilization Standing green band with handles: bil shoulder extension with cue for deep cervical flexion activation Supine deep cervical nods 5-10 sec holds (Added to HEP- see below) Seated deep cervical activation (Added to HEP- see below) Manual: seated soft tissue mobilization to right > left cervical paraspinals and suboccipitals; grade 2 cervical distraction 3x 10; avoided right upper trap soft tissue mob secondary to blood clot in right port          10/17/2023 Manual: seated soft tissue mobilization to right > left cervical paraspinals and suboccipitals; grade 2 cervical distraction 3x 10; avoided right upper trap soft tissue mob secondary to blood clot in right port Attempted thoracic extension exercises against small ball put patient had increased abdominal pain that radiated into her back Seated TA activation + ball squeeze x 20  Seated scap squeeze with light band around wrist 2 x 8  Seated shoulder abduction against light band + shoulder flexion 2 x 8 Chin tuck 2 x 10 Seated shoulder ER with light loop 2 x 8  Standing QL stretch x 8 each side Attempted foam roll roll up at [facility] but this was painful for patient's side Foam roll along patient's spine at wall + snow angel x 8 Seated scalene  stretch 2 x 20sec bilateral     10/11/2023 Update on status and appointment from neck specialist Manual: seated soft tissue mobilization to right > left cervical paraspinals and suboccipitals; grade 2 cervical distraction 3x 10; avoided right upper trap soft tissue mob secondary to blood clot in right  port Seated thoracic extension against small blue ball (minimal cervical movement ) x 10 then thoracic rotation x 10 each side Seated shoulder abduction against blue loop x 10  Seated shoulder abduction + scap squeeze with blue loop around wrist 2 x 8 Seated thoracic rotation sitting on plinth x 4 each direction                                                                                                         PATIENT EDUCATION:  Education details: Discussed eval findings, rehab rationale and POC and patient is in agreement  Person educated: Patient Education method: Explanation and Handouts Education comprehension: verbalized understanding and needs further education  HOME EXERCISE PROGRAM: Access Code: [email or web address] URL: [email or web address] Date: 10/25/2023 Prepared  by: Glade Pesa  Exercises - Seated Shoulder Shrugs  - 2 x daily - 5 x weekly - 2 sets - 10 reps - Seated Scapular Retraction  - 2 x daily - 5 x weekly - 2 sets - 10 reps - Gentle Levator Scapulae Stretch  - 1 x daily - 7 x weekly - 1 sets - 2 reps - 30 seconds hold - Seated Cervical Sidebending Stretch  - 1 x daily - 7 x weekly - 1 sets - 2 reps - 30 seconds hold - Quadricep Stretch with Chair and Counter Support  - 1 x daily - 7 x weekly - 2 sets - 20-30s hold - Standing Hamstring Stretch with Step  - 1 x daily - 7 x weekly - 2 sets - 20-30s hold - Seated Hamstring Stretch  - 1 x daily - 7 x weekly - 2 sets - 20-30 hold - Seated Hip Flexor Stretch  - 1 x daily - 7 x weekly - 2 sets - 20-30 hold - Seated Scalene Stretch with Towel  - 1 x daily - 7 x weekly - 2 sets - 20 reps - Seated Modified Small Range Crunch in Chair  - 1 x daily - 7 x weekly - 1-2 sets - 10 reps - Standing Marching  - 1 x daily - 7 x weekly - 1 sets - 10 reps - Median Nerve Flossing - Tray  - 1 x daily - 7 x weekly - 1 sets - 10 reps - Seated Cervical Traction  - 1 x daily - 7 x weekly - 1 sets - 10 reps - Seated Neck and  Traction  - 1 x daily - 7 x weekly - 1 sets - 10 reps - Standing Low Shoulder Row with Anchored Resistance  - 1 x daily - 7 x weekly - 1 sets - 10 reps - Seated Thoracic Flexion and Rotation with Arms Crossed  - 1 x daily - 7 x weekly - 1 sets - 5 reps - Seated Thoracic Lumbar Extension  - 1 x daily - 7 x weekly - 1 sets - 5-10 reps - Supine Head Nod with Deep Neck Flexor Activation  - 1 x daily - 7 x weekly - 1 sets - 10 reps - Seated Deep Neck Flexor Nods  - 1 x daily - 7 x weekly - 1 sets - 10 reps ASSESSMENT:  CLINICAL IMPRESSION: Good carryover and compliance with deep cervical flexor activation.  Progressed ex's to include the addition of longer holds and UE/LE movements.  Improving soft tissue mobility in cervical region.  No dramatic change with cervical traction. Therapist monitoring response to all interventions and modifying treatment accordingly.   She will follow up with her doctor about her port and aquatic ex.  She states she has a referral for aquatic PT for her lumbar spine from Dr. Duwayne and we discussed consolidating orders.    OBJECTIVE IMPAIRMENTS: decreased activity tolerance, decreased mobility, decreased ROM, increased fascial restrictions, increased muscle spasms, impaired UE functional use, postural dysfunction, and pain.   ACTIVITY LIMITATIONS: carrying, lifting, bending, bed mobility, and reach over head  PERSONAL FACTORS: Age, Fitness, Past/current experiences, and 1 comorbidity: lymphedema are also affecting patient's functional outcome.   REHAB POTENTIAL: Good  CLINICAL DECISION MAKING: Stable/uncomplicated  EVALUATION COMPLEXITY: Low   GOALS: Goals reviewed with patient? No    SHORT TERM GOALS=LONG TERM GOALS: Target date: 12/26/2023  Patient to demonstrate independence in HEP  Baseline: [id number] Goal status: ongoing  2.  Patient will acknowledge 4/10 pain at least once during episode of care   Baseline: 8/10 Goal status: ongoing  3.  Patient  will score at least 20/50 on NDI to signify clinically meaningful improvement in functional abilities.   Baseline: 28/50 Goal status:  met 9/30  4.  160d AROM in B shoulder flexion/abduction Baseline: 150d Goal status: ongoing  5.  Improved cervical rotation ROM to 45 degrees needed for driving revised Goal status:   6. Be able to walk 1 hour or navigate hills at [facility] new  7. PSFS rating for computer work improved to 6-7 new  8. PSFS rating for cooking improved to 6 new   PLAN:  PT FREQUENCY: 1x/week  PT DURATION: 12 weeks   PLANNED INTERVENTIONS: 97110-Therapeutic exercises, 97530- Therapeutic activity, 97112- Neuromuscular re-education, 97535- Self Care, and [id number]- Manual therapy  PLAN FOR NEXT SESSION   deep cervical flexor strengthening;  postural strengthening; manual techniques as appropriate; pt states Dr. Duwayne gave her a referral for aquatic PT for lumbar spine  Glade Pesa, PT 10/31/23 5:45 PM Phone: [phone] Fax: [phone]

## 2023-10-31 NOTE — Telephone Encounter (Signed)
 Flonase  nasal spray  was called in at [facility] with 5 refills.

## 2023-11-01 ENCOUNTER — Encounter: Admitting: Physical Therapy

## 2023-11-01 ENCOUNTER — Ambulatory Visit: Payer: Self-pay

## 2023-11-01 DIAGNOSIS — R279 Unspecified lack of coordination: Secondary | ICD-10-CM | POA: Diagnosis not present

## 2023-11-01 DIAGNOSIS — M6281 Muscle weakness (generalized): Secondary | ICD-10-CM | POA: Diagnosis not present

## 2023-11-01 DIAGNOSIS — R102 Pelvic and perineal pain unspecified side: Secondary | ICD-10-CM | POA: Diagnosis not present

## 2023-11-01 DIAGNOSIS — M62838 Other muscle spasm: Secondary | ICD-10-CM | POA: Diagnosis not present

## 2023-11-01 DIAGNOSIS — M5459 Other low back pain: Secondary | ICD-10-CM | POA: Diagnosis not present

## 2023-11-01 NOTE — Therapy (Signed)
 OUTPATIENT PHYSICAL THERAPY FEMALE PELVIC TREATMENT     Patient Name: Karen [name] MRN: [id number] DOB:07-15-1970, 53 y.o., female Today's Date: 11/01/2023  END OF SESSION:  PT End of Session - 11/01/23 0935     Visit Number 15    Date for Recertification  12/26/23    Authorization Type [name]    PT Start Time 0930    PT Stop Time 1012    PT Time Calculation (min) 42 min    Activity Tolerance Patient tolerated treatment well    Behavior During Therapy [facility] for tasks assessed/performed                      Past Medical History:  Diagnosis Date   Adenomyosis    Anemia    Arthritis 2013   L knee gets steroid injections    Arthritis, rheumatic, acute or subacute    Asthmatic bronchitis 01/31/2017   Back pain    Chronic constipation    Diarrhea    DVT of lower extremity (deep venous thrombosis) (HCC)    Dysmenorrhea    Endometriosis    Esophageal stricture    G6PD deficiency    GERD (gastroesophageal reflux disease)    History of kidney stones    History of uterine fibroid    IBS (irritable bowel syndrome)    Interstitial cystitis    Lactose intolerance    Migraine    with aura   Other hemochromatosis 06/10/2021   PONV (postoperative nausea and vomiting)    Recurrent upper respiratory infection (URI)    Sebaceous cyst of breast, right lower inner quadrant 10/25/2012   Excised 11/21/12    Sleep apnea    Syncope, non cardiac    Past Surgical History:  Procedure Laterality Date   ARTHROSCOPIC HAGLUNDS REPAIR     BREAST CYST EXCISION Right 11/21/2012   Procedure: CYST EXCISION BREAST;  Surgeon: Sherlean JINNY Laughter, MD;  Location: [facility];  Service: General;  Laterality: Right;   BREAST CYST EXCISION Bilateral 01/18/2022   Procedure: EXCISION OF SEBACEOUS CYST BILATERAL BREAST;  Surgeon: Vanderbilt Ned, MD;  Location: [facility];  Service: General;  Laterality: Bilateral;   BREAST EXCISIONAL BIOPSY  Right 11/2012   CESAREAN SECTION  04/19/1993   CHOLECYSTECTOMY     COLONOSCOPY  05/02/2011   Procedure: COLONOSCOPY;  Surgeon: Lynwood LITTIE Celestia Mickey., MD;  Location: [facility];  Service: Endoscopy;  Laterality: N/A;   colonoscopy  11/04/2014   DENTAL SURGERY  03/06/2018   2 surgeries ( 03/06/2018 and 04/06/2018)   to remove two separate benign tumors   ESOPHAGEAL MANOMETRY N/A 11/04/2015   Procedure: ESOPHAGEAL MANOMETRY (EM);  Surgeon: Lynwood Celestia, MD;  Location: [facility];  Service: Endoscopy;  Laterality: N/A;   ESOPHAGOGASTRODUODENOSCOPY  05/02/2011   Procedure: ESOPHAGOGASTRODUODENOSCOPY (EGD);  Surgeon: Lynwood LITTIE Celestia Mickey., MD;  Location: [facility];  Service: Endoscopy;  Laterality: N/A;   ESOPHAGOGASTRODUODENOSCOPY N/A 09/01/2014   Procedure: ESOPHAGOGASTRODUODENOSCOPY (EGD);  Surgeon: Lynwood Celestia, MD;  Location: [facility];  Service: Endoscopy;  Laterality: N/A;   IR CV LINE INJECTION  09/13/2023   IR IMAGING GUIDED PORT INSERTION  07/06/2023   KNEE SURGERY     LAPAROSCOPY     x 4   LESION REMOVAL N/A 01/18/2022   Procedure: EXCISION OF SEBACEOUS CYSTS CHEST AND NECK;  Surgeon: Vanderbilt Ned, MD;  Location: [facility];  Service: General;  Laterality: N/A;   PH IMPEDANCE STUDY N/A  11/04/2015   Procedure: PH IMPEDANCE STUDY;  Surgeon: Lynwood Bohr, MD;  Location: [facility];  Service: Endoscopy;  Laterality: N/A;   VAGINAL HYSTERECTOMY  2010   TVH--ovaries remain   Patient Active Problem List   Diagnosis Date Noted   Sjogren syndrome with keratoconjunctivitis 12/09/2022   Other hemochromatosis 06/10/2021   Elevated LFTs 04/04/2021   Colitis 04/04/2021   Bacterial overgrowth syndrome 05/21/2020   Obesity 05/21/2020   History of endometriosis 05/21/2020   Constipation due to outlet dysfunction 02/05/2020   Gastroesophageal reflux disease 02/05/2020   Obstructive sleep apnea treated with continuous positive airway pressure (CPAP) 06/16/2017   Perennial  allergic rhinitis with a nonallergic component 01/31/2017   Asthmatic bronchitis 01/31/2017   GI symptoms 01/31/2017   Family history of colon cancer 01/27/2015   Chest pain 12/13/2012   Dyspnea 12/13/2012   DVT of lower extremity (deep venous thrombosis) (HCC) 01/03/1990    PCP: Elliot Charm, MD  REFERRING PROVIDER: Cleotilde Ronal RAMAN, MD   REFERRING DIAG: M62.89 (ICD-10-CM) - Pelvic floor dysfunction  THERAPY DIAG:  Muscle weakness (generalized)  Other muscle spasm  Other low back pain  Pelvic pain  Unspecified lack of coordination  Rationale for Evaluation and Treatment: Rehabilitation  ONSET DATE: chronic  SUBJECTIVE:                                                                                                                                                                                           SUBJECTIVE STATEMENT:  Pt states that bowel movements have been a little inconsistent due to barium study.   PAIN: 10/23/23 Are you having pain? Yes NPRS scale: 6-7/10 Pain location: Lower abdominal pain, Lt sided pain, low back pain  Pain type: turning, knotted, pressure Pain description: intermittent and constant   Aggravating factors: constipation or frequent bowel movements/constantly having bowel movements  Relieving factors: exercises (bowel massage, happy baby, spinal twists, walking)  PRECAUTIONS: None  WEIGHT BEARING RESTRICTIONS: No  FALLS:  Has patient fallen in last 6 months? No  LIVING ENVIRONMENT: Lives with: lives with their spouse Lives in: House/apartment  OCCUPATION: nurse, in admin  PLOF: Independent  PATIENT GOALS: decrease pain and have more normal bowel movements  PERTINENT HISTORY:  Vaginal hysterectomy, DVT LE, endometriosis, interstitial cystitis, exploratory laps for endometriosis, c-section, cholecystectomy, RA diagnosed 06/2023 Sexual abuse: No  BOWEL MOVEMENT: Pain with bowel movement: Yes Type of bowel  movement:Type (Bristol Stool Scale) 1-7 (sometimes full range in the same bowel movement), Frequency sometimes many times a day, sometimes up to 4 days in between, and Strain Yes Fully empty rectum: No Leakage: No Pads: No Fiber  supplement: No - has attempted metamucil and it made constipation worse  URINATION: Pain with urination: Yes Fully empty bladder: No Stream: varies  Urgency: Yes: all the time Frequency: multiple times an hours  Leakage: Urge to void, Walking to the bathroom, Coughing, Sneezing, Laughing, and Exercise Pads: Yes: daily  INTERCOURSE: Pain with intercourse: Initial Penetration and During Penetration Ability to have vaginal penetration:  Yes: with pain Climax: non painful WNL   PREGNANCY: Vaginal deliveries 1 Tearing Yes: 3rd degree tear C-section deliveries 1 Currently pregnant No  PROLAPSE: Periodically will feel vaginal bulging, heaviness in lower abdomen   OBJECTIVE:  10/10/23 Curl-up test: Minimal abdominal distortion focused in upper abdominals, largely improved Transversus abdominus: good core facilitation with exhale Sit-up test: 1/3  LUMBARAROM/PROM:  A/PROM A/PROM  Eval (% available) 11/09/22 (% available) 04/25/23 (% available) 10/10/23 (% available)  Flexion 50 90 90 100  Extension 25, pain anteriorly  25, pain in low back 50 75  Right lateral flexion 50, pain anteriorly  75, pain on right 50, pain on Rt 75  Left lateral flexion 50, pain anteriorly  75, pain on right 75 75   (Blank rows = not tested)    05/04/23:               Internal Pelvic Floor: mild tenderness in bil levator ani, but no increase in tension; with internal rectal exam, she was demonstrating paradoxical contraction when trying to eccentrically lengthen with exhale and had very hard time correcting  Patient confirms identification and approves PT to assess internal pelvic floor and treatment Yes  PELVIC MMT:   MMT eval  Vaginal 2/5, 3 second hold, 3 repeat  contractions  Internal Anal Sphincter 1/5  External Anal Sphincter 2/5  Puborectalis 1/5  Diastasis Recti   (Blank rows = not tested)        TONE: WNL   PROLAPSE: Grade 2 anterior vaginal wall laxity  04/25/23: Curl-up test: abdominal distortion Transversus abdominus: week, difficulty getting active contraction (not been working on strengthening)    LUMBARAROM/PROM:  A/PROM A/PROM  Eval (% available) 11/09/22 (% available) 04/25/23 (% available)  Flexion 50 90 90  Extension 25, pain anteriorly  25, pain in low back 50  Right lateral flexion 50, pain anteriorly  75, pain on right 50, pain on Rt  Left lateral flexion 50, pain anteriorly  75, pain on right 75   (Blank rows = not tested)  11/09/22: No internal rectal or vaginal pelvic floor exam performed due to high level of skin irritation   Weak transversus abdominus contraction  Abdominal restriction and tenderness in bil lower quadrant  Unable to perform pelvic tilt with appropriate coordination  LUMBARAROM/PROM:  A/PROM A/PROM  Eval (% available) 11/09/22 (% available)  Flexion 50 90  Extension 25, pain anteriorly  25, pain in low back  Right lateral flexion 50, pain anteriorly  75, pain on right  Left lateral flexion 50, pain anteriorly  75, pain on right   (Blank rows = not tested) 07/21/22: standing prolapse assessment demonstrated grade 2 anterior vaginal wall laxity   06/08/22:               External Perineal Exam: WNL                              Internal Pelvic Floor: burning reported with palpation of superficial muscles; deep aching/pressure with palpation of Lt levator ani   Patient  confirms identification and approves PT to assess internal pelvic floor and treatment Yes  PELVIC MMT:   MMT eval  Vaginal 3/5  Internal Anal Sphincter 1/5  External Anal Sphincter 2/5  Puborectalis 1/5  Diastasis Recti   (Blank rows = not tested)        TONE: High in Lt levator ani   PROLAPSE: Not able to tell this  session due to dyssynergic pelvic floor contraction     06/01/22: COGNITION: Overall cognitive status: Within functional limits for tasks assessed     SENSATION: Light touch: Appears intact Proprioception: Appears intact  GAIT: Comments: decreased hip extension, forward flexed trunk  POSTURE: rounded shoulders, forward head, decreased lumbar lordosis, increased thoracic kyphosis, posterior pelvic tilt, and flexed trunk    LUMBARAROM/PROM:  A/PROM A/PROM  Eval (% available)  Flexion 50  Extension 25, pain anteriorly   Right lateral flexion 50, pain anteriorly   Left lateral flexion 50, pain anteriorly    (Blank rows = not tested)   PALPATION:   General  Significant abdominal restriction and tenderness; decreased rib mobility with mobilization and breathing    TODAY'S TREATMENT:                                                                                                                              DATE:  11/01/23 Manual: Supine: ILU bowel mobilization  Abdominal scar tissue mobilization Ileocecal valve mobilization External rectum mobilization Diaphragm release Neuromuscular re-education: Bridge with hip adduction, transversus abdominus, and pelvic floor muscle 2 x 10 Supine unilateral bent knee fall out 2 x 10 bil Supine resisted march + green band 2 x 10 Exercises: Sidelying: Clam shells 2 x 10 bil Reverse clam shells 2 x 10 bil Hip abduction 2 x 10 bil Open books 10x bil  10/23/23 Manual: Supine: ILU bowel mobilization  Abdominal scar tissue mobilization Ileocecal valve mobilization External rectum mobilization Diaphragm release Neuromuscular re-education: Seated hip adduction ball press with transversus abdominus and pelvic floor muscle 2 x 10 Seated hip abduction red band with transversus abdominus and pelvic floor muscle 2 x 10 Seated resisted march red band with transversus abdominus and pelvic floor muscle 2 x 10 Seated unilateral hip  abduction + red band 15x bil Seated thoracic rotations for abdominal mobility 10x Supine leg extensions 10x bil  10/18/23 Manual: Supine ILU bowel mobilization  Abdominal scar tissue mobilization Ileocecal valve mobilization External rectum mobilization Diaphragm release Lt side lying: Trigger point release to Rt obliques, thoracic and lumbar paraspinals, intercostals, quadratus lumborum    PATIENT EDUCATION:  Education details: See above Person educated: Patient Education method: Programmer, Multimedia, Demonstration, Tactile cues, Verbal cues, and Handouts Education comprehension: verbalized understanding  HOME EXERCISE PROGRAM: [id number]  ASSESSMENT:  CLINICAL IMPRESSION: Pt overall doing very well and is having consistent bowel movements even though barium study slowed things down some. We continued discharge planning for the end of this plan of care and believe starting aquatic therapy program  will be a helpful transition as we slow down. Good tolerance to more strengthening this session with no increase in pain. She will continue to benefit from skilled PT intervention in order to decrease pain, improve bowel movements, and promote independence with HEP.     OBJECTIVE IMPAIRMENTS: decreased activity tolerance, decreased coordination, decreased endurance, decreased mobility, decreased strength, increased fascial restrictions, increased muscle spasms, impaired tone, postural dysfunction, and pain.   ACTIVITY LIMITATIONS: lifting, bending, continence, and locomotion level  PARTICIPATION LIMITATIONS: interpersonal relationship, community activity, and occupation  PERSONAL FACTORS: 1 comorbidity: medical history are also affecting patient's functional outcome.   REHAB POTENTIAL: Good  CLINICAL DECISION MAKING: Stable/uncomplicated  EVALUATION COMPLEXITY: Low   GOALS: Goals reviewed with patient? Yes  SHORT TERM GOALS: Updated 10/10/23  Pt will be independent with HEP.    Baseline: Goal status: MET 07/12/22  2.  Pt will be independent with diaphragmatic breathing and down training activities in order to improve pelvic floor relaxation.  Baseline:  Goal status: MET 07/12/22  3.  Pt will be independent with the knack, urge suppression technique, and double voiding in order to improve bladder habits and decrease urinary incontinence.   Baseline:  Goal status: MET 09/22/22  4.  Pt will be independent with use of squatty potty, relaxed toileting mechanics, and improved bowel movement techniques in order to increase ease of bowel movements and complete evacuation.   Baseline:  Goal status: MET 11/09/22  5.  Pt will be able to correctly perform diaphragmatic breathing and appropriate pressure management in order to prevent worsening vaginal wall laxity and improve pelvic floor A/ROM.   Baseline:  Goal status: MET 01/18/23  LONG TERM GOALS: Updated 10/10/23  Pt will be independent with advanced HEP.   Baseline:  Goal status: IN PROGRESS 10/10/23  2.  Pt will demonstrate normal pelvic floor muscle tone and A/ROM, able to achieve 4/5 strength with contractions and 10 sec endurance, in order to provide appropriate lumbopelvic support in functional activities.   Baseline: not assessed today per patient request - no current bladder issues and they have all resolved Goal status: IN PROGRESS 10/10/23  3.  Pt will increase all impaired lumbar A/ROM by 25% without pain.  Baseline: Pt seen a little bit of loss in some and improvement in extension Goal status:  IN PROGRESS 10/10/23  4.  Pt will report pain no higher than 3/10 with any activity. Baseline: pain currently 3/10, worst is an 8/10 and often corresponds to after she is eating Goal status:  IN PROGRESS 10/10/23  5.  Pt will report 0/10 pain with vaginal penetration in order to improve intimate relationship with partner.    Baseline: no recent intercourse attempts  Goal status:  IN PROGRESS 10/10/23  6.   Pt will be able to go 2-3 hours in between voids without urgency or incontinence in order to improve QOL and perform all functional activities with less difficulty.   Baseline:  Goal status: MET 02/21/23  7.  Pt will report no leaks with laughing, coughing, sneezing in order to improve comfort with interpersonal relationships and community activities.   Baseline: Pt states that she feels 30-40% better Goal status:  MET 10/10/23  8.  Pt will have consistent bowel movements 4-5x/week without straining or pain.  Baseline:  Goal status:  MET 02/21/23  PLAN:  PT FREQUENCY: 1-2x/week  PT DURATION: 6 months  PLANNED INTERVENTIONS: Therapeutic exercises, Therapeutic activity, Neuromuscular re-education, Balance training, Gait training, Patient/Family education, Self Care, Joint  mobilization, Dry Needling, Biofeedback, and Manual therapy  PLAN FOR NEXT SESSION: progress strengthening program; prepare for discharge in the next 6-8 visits   Josette Mares, PT, DPT10/29/259:35 AM

## 2023-11-02 ENCOUNTER — Other Ambulatory Visit: Payer: Self-pay

## 2023-11-02 ENCOUNTER — Other Ambulatory Visit (HOSPITAL_COMMUNITY): Payer: Self-pay

## 2023-11-02 NOTE — Progress Notes (Signed)
 Specialty Pharmacy Refill Coordination Note  Karen [name] is a 52 y.o. female contacted today regarding refills of specialty medication(s) Adalimumab  (Humira  (2 Pen))   Patient requested Pickup at [facility] at [place] date: 11/06/23   Medication will be filled on: 11/03/23

## 2023-11-06 ENCOUNTER — Ambulatory Visit: Attending: Surgery | Admitting: Occupational Therapy

## 2023-11-06 DIAGNOSIS — I890 Lymphedema, not elsewhere classified: Secondary | ICD-10-CM | POA: Diagnosis not present

## 2023-11-06 NOTE — Therapy (Signed)
 OUTPATIENT OCCUPATIONAL THERAPY TREATMENT NOTE  BLE/ BLQ LYMPHEDEMA  Patient Name: Karen [name] MRN: [id number] DOB:07-29-1970, 53 y.o., female Today's Date: 11/06/2023  REPORTING PERIOD: 10/30/23 -   END OF SESSION:  OT End of Session - 11/06/23 1512     Visit Number 61    Number of Visits 72    Date for Recertification  11/16/23    OT Start Time 1100    OT Stop Time 1200    OT Time Calculation (min) 60 min    Activity Tolerance Patient tolerated treatment well;No increased pain    Behavior During Therapy [facility] for tasks assessed/performed           Past Medical History:  Diagnosis Date   Adenomyosis    Anemia    Arthritis 2013   L knee gets steroid injections    Arthritis, rheumatic, acute or subacute    Asthmatic bronchitis 01/31/2017   Back pain    Chronic constipation    Diarrhea    DVT of lower extremity (deep venous thrombosis) (HCC)    Dysmenorrhea    Endometriosis    Esophageal stricture    G6PD deficiency    GERD (gastroesophageal reflux disease)    History of kidney stones    History of uterine fibroid    IBS (irritable bowel syndrome)    Interstitial cystitis    Lactose intolerance    Migraine    with aura   Other hemochromatosis 06/10/2021   PONV (postoperative nausea and vomiting)    Recurrent upper respiratory infection (URI)    Sebaceous cyst of breast, right lower inner quadrant 10/25/2012   Excised 11/21/12    Sleep apnea    Syncope, non cardiac    Past Surgical History:  Procedure Laterality Date   ARTHROSCOPIC HAGLUNDS REPAIR     BREAST CYST EXCISION Right 11/21/2012   Procedure: CYST EXCISION BREAST;  Surgeon: Sherlean JINNY Laughter, MD;  Location: [facility];  Service: General;  Laterality: Right;   BREAST CYST EXCISION Bilateral 01/18/2022   Procedure: EXCISION OF SEBACEOUS CYST BILATERAL BREAST;  Surgeon: Vanderbilt Ned, MD;  Location: [facility];  Service: General;  Laterality: Bilateral;   BREAST  EXCISIONAL BIOPSY Right 11/2012   CESAREAN SECTION  04/19/1993   CHOLECYSTECTOMY     COLONOSCOPY  05/02/2011   Procedure: COLONOSCOPY;  Surgeon: Lynwood LITTIE Celestia Mickey., MD;  Location: [facility];  Service: Endoscopy;  Laterality: N/A;   colonoscopy  11/04/2014   DENTAL SURGERY  03/06/2018   2 surgeries ( 03/06/2018 and 04/06/2018)   to remove two separate benign tumors   ESOPHAGEAL MANOMETRY N/A 11/04/2015   Procedure: ESOPHAGEAL MANOMETRY (EM);  Surgeon: Lynwood Celestia, MD;  Location: [facility];  Service: Endoscopy;  Laterality: N/A;   ESOPHAGOGASTRODUODENOSCOPY  05/02/2011   Procedure: ESOPHAGOGASTRODUODENOSCOPY (EGD);  Surgeon: Lynwood LITTIE Celestia Mickey., MD;  Location: [facility];  Service: Endoscopy;  Laterality: N/A;   ESOPHAGOGASTRODUODENOSCOPY N/A 09/01/2014   Procedure: ESOPHAGOGASTRODUODENOSCOPY (EGD);  Surgeon: Lynwood Celestia, MD;  Location: [facility];  Service: Endoscopy;  Laterality: N/A;   IR CV LINE INJECTION  09/13/2023   IR IMAGING GUIDED PORT INSERTION  07/06/2023   KNEE SURGERY     LAPAROSCOPY     x 4   LESION REMOVAL N/A 01/18/2022   Procedure: EXCISION OF SEBACEOUS CYSTS CHEST AND NECK;  Surgeon: Vanderbilt Ned, MD;  Location: [facility];  Service: General;  Laterality: N/A;   PH IMPEDANCE STUDY N/A 11/04/2015   Procedure:  PH IMPEDANCE STUDY;  Surgeon: Lynwood Bohr, MD;  Location: [facility];  Service: Endoscopy;  Laterality: N/A;   VAGINAL HYSTERECTOMY  2010   TVH--ovaries remain   Patient Active Problem List   Diagnosis Date Noted   Sjogren syndrome with keratoconjunctivitis 12/09/2022   Other hemochromatosis 06/10/2021   Elevated LFTs 04/04/2021   Colitis 04/04/2021   Bacterial overgrowth syndrome 05/21/2020   Obesity 05/21/2020   History of endometriosis 05/21/2020   Constipation due to outlet dysfunction 02/05/2020   Gastroesophageal reflux disease 02/05/2020   Obstructive sleep apnea treated with continuous positive airway pressure (CPAP)  06/16/2017   Perennial allergic rhinitis with a nonallergic component 01/31/2017   Asthmatic bronchitis 01/31/2017   GI symptoms 01/31/2017   Family history of colon cancer 01/27/2015   Chest pain 12/13/2012   Dyspnea 12/13/2012   DVT of lower extremity (deep venous thrombosis) (HCC) 01/03/1990   PCP: Gaile New, MD  REFERRING PROVIDER: Valery Ripple, MD  REFERRING DIAG: I89.0  THERAPY DIAG:  Lymphedema, not elsewhere classified  Rationale for Evaluation and Treatment: Rehabilitation  ONSET DATE: ~2014; exacerbation in 2023  SUBJECTIVE:                                                                                                                                                                                          SUBJECTIVE STATEMENT:  Pt presents for OT to address lower quadrant, including deep abdominal lymphatics, and LLE lymphedema w/ associated pain. Pt denies LE related pain today. Pt reports her rheumatologist stands by her RA diagnosis, and that multiple health issues se is experiencing are unrelated.  Pt recently dx with spinal stenosis, cervical and thoracic?, which may be, or may not be, contributing to abdominal discomfort and osteo arthritis. Pt reports the plan is to continue with current Humira  infusion schedule for RA. She reports she sees her orthopedic provider next week for treatment planning for stenosis.  PERTINENT HISTORY: CVI, OSA (no CPAP), GERD, Esophageal stricture, IBS, Lactose intolerant, Diarrhea, Hx LE DVT, recent RA dx, OA  PAIN:  Are you having pain? Yes: 0/10- not rated numerically Pain location: abdomen; digestive discomfort Pain description: abdominal fullness, tightness Aggravating factors: standing, walking,  Relieving factors: MLD, Miralax ,   PRECAUTIONS: Other: LYMPHEDEMA; Hx LLE DVT  WEIGHT BEARING RESTRICTIONS: No  FALLS:  Has patient fallen in last 6 months? No  LIVING ENVIRONMENT: Lives with: lives with spouse and  daughter Lives in: House/apartment Stairs: Yes; Internal: 14 steps; yes and External: 4 steps; yes Has following equipment at home: None  OCCUPATION: Dipl[employer] Services Operational Officer, walking, desk work  LEISURE: family time  HAND DOMINANCE: right  PRIOR LEVEL OF FUNCTION: Independent  PATIENT GOALS: Feel better, be able to be more active without pain, wear preferred street shoes. NEW 08/03/23: Be able to manage digestive discomfort and gut inflammation symptoms without medications  OBJECTIVE: Note: Objective measures were completed at Evaluation unless otherwise noted.  COGNITION:  Overall cognitive status: Within functional limits for tasks assessed   POSTURE: WFL  LE ROM: WFL  LE MMT: WFL  LYMPHEDEMA ASSESSMENTS: non-Ca related  INFECTIONS: denies cellulitis and wound hx  BLE COMPARATIVE LIMB VOLUMETRICS Initial 11/15/22  LANDMARK RIGHT (dominant)  R LEG (A-D) 3028.9 ml  R THIGH (E-G) 4809.60 ml  R FULL LIMB (A-G) 7838.5 ml  Limb Volume differential (LVD)  %  Volume change since initial %  Volume change overall V  (Blank rows = not tested)  LANDMARK LEFT  (Rx)  L LEG (A-D) 3189.9 ml  L THIGH (E-G) 4959.8 ml  L FULL LIMB (A-G) 8149.7 ml  Limb Volume differential (LVD)  LEG LVD = 5.32%, L>R; THIGH LVD = 3.12%, L>R; And full LLE LVD = 3.97%, L>R.   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  Skin  Description Hyper-Keratosis Peau d' Orange Shiny Tight Fibrotic/ Indurated Fatty Doughy Spongy/ boggy       R>L x  x   Skin dry Flaky WNL Macerated   mildly      Color Redness  Varicosities Blanching Hemosiderin Stain Mottled        x   Odor Malodorous Yeast Fungal infection  WNL      x   Temperature Warm Cool wnl     x    Pitting Edema   1+ 2+ 3+ 4+ Non-pitting         x   Girth Symmetrical Asymmetrical                   Distribution    L>R toes to groin    Stemmer Sign Positive Negative   +L -R   Lymphorrhea History Of:  Present Absent     x    Wounds History Of Present Absent Venous Arterial Pressure Sheer     x        Signs of Infection Redness Warmth Erythema Acute Swelling Drainage Borders                    Sensation Light Touch Deep pressure Hypersensitivity   In tact Impaired In tact Impaired Absent Impaired   x  x  x     Nails WNL   Fungus nail dystrophy   x     Hair Growth Symmetrical Asymmetrical   x    Skin Creases Base of toes  Ankles   Base of Fingers knees       Abdominal pannus Thigh Lobules  Face/neck   x           GAIT: Distance walked: >500' Assistive device utilized: None Level of assistance: Complete Independence Comments: Functional ambulation and  transfers [facility]  LYMPHEDEMA LIFE IMPACT SCALE (LLIS): Initial 11/15/22  50%  FOTO functional outcome measure: Deferred. FOTO discontinued.  TODAY'S TREATMENT:                                                                                                                                         MLD to abdominal lymphatics Pt edu for LE self care  PATIENT EDUCATION:  Continued Pt/ CG edu for how function of cisterna chyli, part of the thoracic duct of deep abdominal lymphatics, assists with digestion by filtering many of the fats and proteins from the digestive system. Sub optimal lymphatic flow in this region may result in constipation, bloating, swelling, etc. MLD helps mobilize these protein and fats , and the rhythmic movement of manual lymphatic drainage stimulates digestive muscles and increase peristalsis. Provided printed resource for reference.   Topics include outcome of comparative limb volumetrics- starting limb volume differentials (LVDs), technology and gradient techniques used for short stretch, multilayer compression wrapping, simple self-MLD, therapeutic lymphatic pumping exercises, skin/nail care, LE precautions,. compression garment recommendations and specifications, wear and care schedule and compression garment donning / doffing w assistive devices. Discussed progress towards all OT goals since commencing CDT. All questions answered to the Pt's satisfaction. Good return. Person educated: Patient  Education method: Explanation, Demonstration, and Handouts Education comprehension: verbalized understanding, returned demonstration, verbal cues required, and needs further education  [facility] PRECAUTIONS: Pt education re precautions related to use of advanced sequential pneumatic compression device. Pt verbalized understanding that she should never use device on 2 arms/legs simultaneously and should not use device 2 x on same day to avoid overloading her heart with fluid volume return both at present, and in years to come should her medical condition change. Pt instructed to remove device immediately should she experience atypical SOP, light headedness, or acute pain. Pt instructed to discontinue pump if she suspects, or has any infection, blood clot, cellulitis, the flu, corona virus, etc.   HOME EXERCISE PROGRAM: BLE lymphatic pumping there ex using- 1 sets of 10 reps, each exercise in order-  1-2 x daily, bilaterally Simple self MLD 1 x daily Daily skin care to increase hydration, skin mobility and decrease infection risk- can be done during MLD Compression wraps 23/7 during intensive phase of CDT Compression garments/ devices during self-management phase of CDT  ASSESSMENT:  CLINICAL IMPRESSION: Pt tolerated deep abdominal MLD with very gentle short neck sequence.  Slight R lateral neck swelling is noted again today. No SOB,  dizziness, or increased pain observed or reported. Pt continues to have digestive discomfort associated with chronic constipation alternating with diarrhea.  Pt tolerated the abdominal sequence as established without increased pain. Pt continues to  benefit from MLD for enhancing gut motility and bowel function.  MLD is also known to facilitate the removal of inflammatory substances and toxins from the intestinal area, which may help reduce inflammation  linked to digestive disorders. Cont  1 x weekly through next visit, then decrease frequency to every other week for one month. Continue family training with Pt's daughter for simple MLD at home between sessions. Pt is working on obtaining compression device. Pt in agreement with this plan to reduce frequency.    (10/30/23 OT Progress Note: Pls review GOALS section below for additional details regarding progress towards goals. Pt understands and can apply lymphedema precautions after skilled training. Pt remains 100% compliant with lymphedema self care. Because lymphatic massage to one's own deep abdomen is challenging, especially with orthopedic issues Pt has had this year, Pt's daughter is learning to assist her mom with simple self MLD. Pt has also undergone a trial of the advanced Flexitouch PLUS sequential pneumatic compression device which features an abdominal, truncal, buttocks component above the level of the inguinal LN to facilitate fluid congestion towards the thoracic duct and terminus. Pt felt this helpful to her during the trial and she's hoping for insurance approval. Pt has obtained the recommended knee length compression garment and wears it for prolonged walking and dependent positioning. Limb volume measurement in the lower extremity for comparison was not performed today due to time constraints, but by visual assessment it appears unchanged. Abdominal swelling and tightness is also unchanged by visual assessment and palpation. Pt has reached a  clinical plateau and he agrees with plan to taper treatment to transition to LE self management phase of CDT. After her next session we'll reduce OT for every other week for a couple of weeks, then further reduce PRN.   (11/15/22 Initial Evaluation: Tanaysha Alkins is a 53 y.o. female presenting with very mild, stage II, LLE lymphedema 2/2 venous insufficiency and obesity. L Leg swelling and associated pain swelling fluctuates. It has progressed over time, and now no longer resolves with elevation over night. RLE swelling limits balance during functional ambulation. It exacerbates infection and falls risk. LLE/RLQ lymphedema interferes with functional performance in all occupational domains, including basic and instrumental ADLs, productive activities, leisure pursuits and social participation. Pt will benefit from Occupational Therapy for Complete Decongestive Therapy (CDT) to restore function, to reduce physical and psychologic suffering associated with chronic, progressive lymphedema and associated pain, and to limit infection. CDT will include manual lymphatic drainage (MLD), skin care, therapeutic exercise and compression wraps and garment's devices. Without skilled OT for lymphedema care the condition will worsen and further functional decline is expected  Custom-made gradient compression garments and HOS devices are medically necessary because they are uniquely sized and shaped to fit the exact dimensions of the affected extremities, and to provide appropriate medical grade, graduated compression essential for optimally managing chronic, progressive lymphedema. Multiple custom compression garments are needed to ensure proper hygiene to limit infection risk. Custom compression garments should be replaced q 3-6 months When worn consistently for optimal lipo-lymphedema self-management over time. HOS devices, medically necessary to limit fibrosis buildup in tissue, should be replaced q 2 years and PRN when  worn out.      OBJECTIVE IMPAIRMENTS: decreased activity tolerance, decreased knowledge of condition, decreased knowledge of use of DME, increased edema, impaired sensation, pain, and chronic, progressive leg swelling.   ACTIVITY LIMITATIONS: dependent sitting, extended standing and/ or walking, squatting, and lower body dressing, fitting preferred street shoes  PARTICIPATION LIMITATIONS: shopping, community activity, occupation, and yard work  PERSONAL FACTORS: 3+ comorbidities: Hx DVT,  OSA, CVI. Varicose veins are also affecting patient's functional outcome.   REHAB POTENTIAL: Good  EVALUATION COMPLEXITY:  Moderate   GOALS: Goals reviewed with patient? Yes  SHORT TERM GOALS: Target date: 4th OT Rx visit  SHORT TERM GOALS: Target date: 4th OT Rx visit   Pt will demonstrate understanding of lymphedema precautions and prevention strategies with modified independence using a printed reference to identify at least 5 precautions and discussing how s/he may implement them into daily life to reduce risk of progression with extra time. Baseline:Max A Goal status: GOAL MET   Pt will be able to apply multilayer, knee length, gradient, compression wraps to one leg at a time with modified assistance (extra time and assistive device/s) to decrease limb volume, to limit infection risk, and to limit lymphedema progression.  Baseline: Dependent Goal status: GOAL DISCHARGED. Pt Going to compression garments instead  LONG TERM GOALS: Target date: 02/08/23  Given this patient's Intake score of 67%/100% on the functional outcomes FOTO tool, patient will experience an increase in function of 5 points to improve basic and instrumental ADLs performance, including lymphedema self-care.  Baseline: Max A Goal status:GOAL DISCHARGED.  FOTO TOOL DISCONTINUED AT CLINIC   Given this patient's Intake score of 50% on the Lymphedema Life Impact Scale (LLIS), patient will experience a reduction of at least 5  points in her perceived level of functional impairment resulting from lymphedema to improve functional performance and quality of life (QOL). Baseline: 50% Goal status: PROGRESSING  Pt will achieve at least a 10% volume reduction in B legs to return limb to typical size and shape, to limit infection risk and LE progression, to decrease pain, to improve function. Baseline: Dependent Goal status: PROGRESSING  4.  Pt will obtain proper compression garments/devices and achieve modified independence (extra time + assistive devices) with donning/doffing to optimize limb volume reductions and limit LE  progression over time. Baseline:  Goal status:GOAL MET  5.  During Intensive phase CDT , with modified independence, Pt will achieve at least 85% compliance with all lymphedema self-care home program components, including daily skin care, compression wraps and /or garments, simple self MLD and lymphatic pumping therex to habituate LE self care protocol  into ADLs for optimal LE self-management over time. Baseline: Dependent Goal status:GOAL MET   PLAN:  OT FREQUENCY: 1 /week  OT DURATION: 12 weeks and PRN  PLANNED INTERVENTIONS: 97110-Therapeutic exercises, 97530- Therapeutic activity, 97535- Self Care, [id number]- Manual therapy, Patient/Family education, Manual lymph drainage, Compression bandaging, and fit with appropriate compression garments  PLAN FOR NEXT SESSION:  Progress note Cont MLD as established Pt and family edu for LE self-care  Zebedee Dec, MS, OTR/L, CLT-LANA 11/06/23 3:14 PM

## 2023-11-07 ENCOUNTER — Other Ambulatory Visit (HOSPITAL_COMMUNITY): Payer: Self-pay

## 2023-11-07 DIAGNOSIS — J441 Chronic obstructive pulmonary disease with (acute) exacerbation: Secondary | ICD-10-CM | POA: Diagnosis not present

## 2023-11-07 DIAGNOSIS — Z1211 Encounter for screening for malignant neoplasm of colon: Secondary | ICD-10-CM | POA: Diagnosis not present

## 2023-11-07 DIAGNOSIS — M5412 Radiculopathy, cervical region: Secondary | ICD-10-CM | POA: Diagnosis not present

## 2023-11-07 DIAGNOSIS — K582 Mixed irritable bowel syndrome: Secondary | ICD-10-CM | POA: Diagnosis not present

## 2023-11-07 DIAGNOSIS — Z0000 Encounter for general adult medical examination without abnormal findings: Secondary | ICD-10-CM | POA: Diagnosis not present

## 2023-11-07 DIAGNOSIS — M47812 Spondylosis without myelopathy or radiculopathy, cervical region: Secondary | ICD-10-CM | POA: Diagnosis not present

## 2023-11-07 DIAGNOSIS — E2839 Other primary ovarian failure: Secondary | ICD-10-CM | POA: Diagnosis not present

## 2023-11-08 ENCOUNTER — Other Ambulatory Visit: Payer: Self-pay | Admitting: Obstetrics & Gynecology

## 2023-11-08 ENCOUNTER — Encounter: Payer: Self-pay | Admitting: Physical Therapy

## 2023-11-08 ENCOUNTER — Ambulatory Visit: Attending: Orthopedic Surgery | Admitting: Physical Therapy

## 2023-11-08 DIAGNOSIS — M62838 Other muscle spasm: Secondary | ICD-10-CM | POA: Diagnosis present

## 2023-11-08 DIAGNOSIS — M542 Cervicalgia: Secondary | ICD-10-CM | POA: Insufficient documentation

## 2023-11-08 DIAGNOSIS — M6281 Muscle weakness (generalized): Secondary | ICD-10-CM | POA: Diagnosis present

## 2023-11-08 DIAGNOSIS — M5459 Other low back pain: Secondary | ICD-10-CM | POA: Diagnosis present

## 2023-11-08 DIAGNOSIS — R279 Unspecified lack of coordination: Secondary | ICD-10-CM | POA: Insufficient documentation

## 2023-11-08 DIAGNOSIS — R102 Pelvic and perineal pain unspecified side: Secondary | ICD-10-CM | POA: Diagnosis present

## 2023-11-08 DIAGNOSIS — Z1231 Encounter for screening mammogram for malignant neoplasm of breast: Secondary | ICD-10-CM

## 2023-11-08 NOTE — Therapy (Signed)
 OUTPATIENT PHYSICAL THERAPY CERVICAL TREATMENT   Patient Name: Karen [name] MRN: [id number] DOB:10-22-1970, 53 y.o., female Today's Date: 11/08/2023  END OF SESSION:  PT End of Session - 11/08/23 1328     Visit Number 15    Date for Recertification  12/26/23    Authorization Type [name]    Authorization Time Period signed dry needling consent form 08/08/23    PT Start Time 1105    PT Stop Time 1147    PT Time Calculation (min) 42 min    Activity Tolerance Patient tolerated treatment well    Behavior During Therapy [facility] for tasks assessed/performed                   Past Medical History:  Diagnosis Date   Adenomyosis    Anemia    Arthritis 2013   L knee gets steroid injections    Arthritis, rheumatic, acute or subacute    Asthmatic bronchitis 01/31/2017   Back pain    Chronic constipation    Diarrhea    DVT of lower extremity (deep venous thrombosis) (HCC)    Dysmenorrhea    Endometriosis    Esophageal stricture    G6PD deficiency    GERD (gastroesophageal reflux disease)    History of kidney stones    History of uterine fibroid    IBS (irritable bowel syndrome)    Interstitial cystitis    Lactose intolerance    Migraine    with aura   Other hemochromatosis 06/10/2021   PONV (postoperative nausea and vomiting)    Recurrent upper respiratory infection (URI)    Sebaceous cyst of breast, right lower inner quadrant 10/25/2012   Excised 11/21/12    Sleep apnea    Syncope, non cardiac    Past Surgical History:  Procedure Laterality Date   ARTHROSCOPIC HAGLUNDS REPAIR     BREAST CYST EXCISION Right 11/21/2012   Procedure: CYST EXCISION BREAST;  Surgeon: Sherlean JINNY Laughter, MD;  Location: [facility];  Service: General;  Laterality: Right;   BREAST CYST EXCISION Bilateral 01/18/2022   Procedure: EXCISION OF SEBACEOUS CYST BILATERAL BREAST;  Surgeon: Vanderbilt Ned, MD;  Location: [facility];  Service: General;   Laterality: Bilateral;   BREAST EXCISIONAL BIOPSY Right 11/2012   CESAREAN SECTION  04/19/1993   CHOLECYSTECTOMY     COLONOSCOPY  05/02/2011   Procedure: COLONOSCOPY;  Surgeon: Lynwood LITTIE Celestia Mickey., MD;  Location: [facility];  Service: Endoscopy;  Laterality: N/A;   colonoscopy  11/04/2014   DENTAL SURGERY  03/06/2018   2 surgeries ( 03/06/2018 and 04/06/2018)   to remove two separate benign tumors   ESOPHAGEAL MANOMETRY N/A 11/04/2015   Procedure: ESOPHAGEAL MANOMETRY (EM);  Surgeon: Lynwood Celestia, MD;  Location: [facility];  Service: Endoscopy;  Laterality: N/A;   ESOPHAGOGASTRODUODENOSCOPY  05/02/2011   Procedure: ESOPHAGOGASTRODUODENOSCOPY (EGD);  Surgeon: Lynwood LITTIE Celestia Mickey., MD;  Location: [facility];  Service: Endoscopy;  Laterality: N/A;   ESOPHAGOGASTRODUODENOSCOPY N/A 09/01/2014   Procedure: ESOPHAGOGASTRODUODENOSCOPY (EGD);  Surgeon: Lynwood Celestia, MD;  Location: [facility];  Service: Endoscopy;  Laterality: N/A;   IR CV LINE INJECTION  09/13/2023   IR IMAGING GUIDED PORT INSERTION  07/06/2023   KNEE SURGERY     LAPAROSCOPY     x 4   LESION REMOVAL N/A 01/18/2022   Procedure: EXCISION OF SEBACEOUS CYSTS CHEST AND NECK;  Surgeon: Vanderbilt Ned, MD;  Location:  SUR[facility]ERY C[facility];  Service: General;  Laterality: N/A;  PH IMPEDANCE STUDY N/A 11/04/2015   Procedure: PH IMPEDANCE STUDY;  Surgeon: Lynwood Bohr, MD;  Location: [facility];  Service: Endoscopy;  Laterality: N/A;   VAGINAL HYSTERECTOMY  2010   TVH--ovaries remain   Patient Active Problem List   Diagnosis Date Noted   Sjogren syndrome with keratoconjunctivitis 12/09/2022   Other hemochromatosis 06/10/2021   Elevated LFTs 04/04/2021   Colitis 04/04/2021   Bacterial overgrowth syndrome 05/21/2020   Obesity 05/21/2020   History of endometriosis 05/21/2020   Constipation due to outlet dysfunction 02/05/2020   Gastroesophageal reflux disease 02/05/2020   Obstructive sleep apnea treated with continuous  positive airway pressure (CPAP) 06/16/2017   Perennial allergic rhinitis with a nonallergic component 01/31/2017   Asthmatic bronchitis 01/31/2017   GI symptoms 01/31/2017   Family history of colon cancer 01/27/2015   Chest pain 12/13/2012   Dyspnea 12/13/2012   DVT of lower extremity (deep venous thrombosis) (HCC) 01/03/1990    PCP: Elliot Charm, MD   REFERRING PROVIDER: Huey Redell Fallow, PA-C  REFERRING DIAG: M54.2 (ICD-10-CM) - Cervicalgia  THERAPY DIAG:  Cervicalgia  Other muscle spasm  Muscle weakness (generalized)  Rationale for Evaluation and Treatment: Rehabilitation  ONSET DATE: July 2025  SUBJECTIVE:                                                                                                                                                                                                         SUBJECTIVE STATEMENT: Patient reports she is doing good right now. Pain is 2 / 10    Eval: Reports onset of L UT pain following port placement into R shoulder Hand dominance: Right  PERTINENT HISTORY:  07/21/2023 Assessment: - suspect left-sided cervical/ trapezius muscle spasm - reassured by absence of red flag signs/ symptoms at this time   Concurrent lymphedema PAIN:  Are you having pain? Yes: NPRS scale: 4/10 Pain location: L UT Pain description: ache, cramp Aggravating factors: activity Relieving factors: heat, medication  PRECAUTIONS: None  RED FLAGS: None     WEIGHT BEARING RESTRICTIONS: No  FALLS:  Has patient fallen in last 6 months? No  OCCUPATION: [employer] Employee  PLOF: Independent  PATIENT GOALS: To manage my neck pain  NEXT MD VISIT: TBD  OBJECTIVE:  Note: Objective measures were completed at Evaluation unless otherwise noted.  DIAGNOSTIC FINDINGS:  Oct 2025 C3-4 mod bulging disc osteophyte C4-5 mod to severe bulging disc osteophyte C5-6 severe bulging osteophyte C6-7 minimal retrolisthesis of c6 and C7  with severe bulging disc T2-75mod left disc protrusion  subarticular  PATIENT SURVEYS:  NDI: 28/50 56% perceived disability  Minimum Detectable Change (90% confidence): 5 points or 10% points  10/03/23:  NDI  30%   The Patient-Specific Functional Scale  Initial:  I am going to ask you to identify up to 3 important activities that you are unable to do or are having difficulty with as a result of this problem.  Today are there any activities that you are unable to do or having difficulty with because of this?  (Patient shown scale and patient rated each activity)  Follow up: When you first came in you had difficulty performing these activities.  Today do you still have difficulty?  Patient-Specific activity scoring scheme (Point to one number):  0 1 2 3 4 5 6 7 8 9  10 Unable                                                                                                          Able to perform To perform                                                                                                    activity at the same Activity         Level as before                                                                                                                       Injury or problem  Activity       Computer work 1 hour                                     9/30: 5                       2.          Cooking  9/30: 5                                                                   3.         Sit and watch TV                                              9/30: 5                                                                         POSTURE: rounded shoulders and forward head  PALPATION: Global tenderness throughout B UT and posterior shoulder girdle   CERVICAL ROM:   Active ROM A/PROM (deg) eval 9/30  Flexion 75%P! 47  Extension 75%P! 45  Right lateral flexion 75%P! 36  Left lateral flexion 75%P! 29  Right rotation  75%P! 35  Left rotation 75%P! 29   (Blank rows = not tested)  UPPER EXTREMITY ROM:  Active ROM Right eval Left eval 9/30  Shoulder flexion 150 150 L 155  R 151  Shoulder extension     Shoulder abduction 150 150 L 167 R 161  Shoulder adduction     Shoulder extension     Shoulder internal rotation     Shoulder external rotation     Elbow flexion     Elbow extension     Wrist flexion     Wrist extension     Wrist ulnar deviation     Wrist radial deviation     Wrist pronation     Wrist supination      (Blank rows = not tested)  UPPER EXTREMITY MMT:  MMT Right eval Left eval 9/30  Shoulder flexion     Shoulder extension     Shoulder abduction     Shoulder adduction     Shoulder extension     Shoulder internal rotation     Shoulder external rotation     Middle trapezius   Bil 4-/5  Lower trapezius   Bil 4-/5  Elbow flexion     Elbow extension     Wrist flexion     Wrist extension     Wrist ulnar deviation     Wrist radial deviation     Wrist pronation     Wrist supination     Grip strength      (Blank rows = not tested)  CERVICAL SPECIAL TESTS:  Neck flexor muscle endurance test: Negative 9/30 decreased cervical extensor strength 4-/5  FUNCTIONAL TESTS:  N/A  TREATMENT  11/08/2023 Update on status and MRI results Hooklying chin tuck x 12 Hooklying scap retraction x 12 Hookyling chin tuck + elbow press down x 12 Hooklying chin tuck + shoulder press down x 12 Decompression series: whole leg lengthening, whole leg press down 5x 5 sec holds Manual: cervical occipital release x 10 5 sec  holds and STM to bilateral upper traps    10/31/2023 Discussion of her visit with Dr. Duwayne regarding back pain and his referral to [facility] Discussed benefit of strengthening cervical deep cervical flexors for stabilization Supine deep cervical activation with: UE elevation alternating, elbow press down, shoulder press down 5x (Added to HEP- pt instructions)   Decompression series: whole leg lengthening, whole leg press down 5x 5 sec holds (Added to HEP- pt instructions) Manual:  grade 2 cervical distraction 3x 10; gentle upper trap lengthening right/left; suboccipital release Saunders cervical traction 8# static 3 min  10/25/2023 Discussed results of recent cervical MRI Postural alignment and how that affects load on cervical spine Discussed benefit of strengthening cervical deep cervical flexors for stabilization Standing green band with handles: bil shoulder extension with cue for deep cervical flexion activation Supine deep cervical nods 5-10 sec holds (Added to HEP- see below) Seated deep cervical activation (Added to HEP- see below) Manual: seated soft tissue mobilization to right > left cervical paraspinals and suboccipitals; grade 2 cervical distraction 3x 10; avoided right upper trap soft tissue mob secondary to blood clot in right port          10/17/2023 Manual: seated soft tissue mobilization to right > left cervical paraspinals and suboccipitals; grade 2 cervical distraction 3x 10; avoided right upper trap soft tissue mob secondary to blood clot in right port Attempted thoracic extension exercises against small ball put patient had increased abdominal pain that radiated into her back Seated TA activation + ball squeeze x 20  Seated scap squeeze with light band around wrist 2 x 8  Seated shoulder abduction against light band + shoulder flexion 2 x 8 Chin tuck 2 x 10 Seated shoulder ER with light loop 2 x 8  Standing QL stretch x 8 each side Attempted foam roll roll up at [facility] but this was painful for patient's side Foam roll along patient's spine at wall + snow angel x 8 Seated scalene  stretch 2 x 20sec bilateral     10/11/2023 Update on status and appointment from neck specialist Manual: seated soft tissue mobilization to right > left cervical paraspinals and suboccipitals; grade 2 cervical distraction 3x 10; avoided right  upper trap soft tissue mob secondary to blood clot in right port Seated thoracic extension against small blue ball (minimal cervical movement ) x 10 then thoracic rotation x 10 each side Seated shoulder abduction against blue loop x 10  Seated shoulder abduction + scap squeeze with blue loop around wrist 2 x 8 Seated thoracic rotation sitting on plinth x 4 each direction                                                                                                         PATIENT EDUCATION:  Education details: Discussed eval findings, rehab rationale and POC and patient is in agreement  Person educated: Patient Education method: Explanation and Handouts Education comprehension: verbalized understanding and needs further education  HOME EXERCISE PROGRAM: Access Code: [email or web address] URL: [email or web address] Date: 10/25/2023 Prepared by: Glade Pesa  Exercises - Seated Shoulder Shrugs  - 2 x daily - 5 x weekly - 2 sets - 10 reps - Seated Scapular Retraction  - 2 x daily - 5 x weekly - 2 sets - 10 reps - Gentle Levator Scapulae Stretch  - 1 x daily - 7 x weekly - 1 sets - 2 reps - 30 seconds hold - Seated Cervical Sidebending Stretch  - 1 x daily - 7 x weekly - 1 sets - 2 reps - 30 seconds hold - Quadricep Stretch with Chair and Counter Support  - 1 x daily - 7 x weekly - 2 sets - 20-30s hold - Standing Hamstring Stretch with Step  - 1 x daily - 7 x weekly - 2 sets - 20-30s hold - Seated Hamstring Stretch  - 1 x daily - 7 x weekly - 2 sets - 20-30 hold - Seated Hip Flexor Stretch  - 1 x daily - 7 x weekly - 2 sets - 20-30 hold - Seated Scalene Stretch with Towel  - 1 x daily - 7 x weekly - 2 sets - 20 reps - Seated Modified Small Range Crunch in Chair  - 1 x daily - 7 x weekly - 1-2 sets - 10 reps - Standing Marching  - 1 x daily - 7 x weekly - 1 sets - 10 reps - Median Nerve Flossing - Tray  - 1 x daily - 7 x weekly - 1 sets - 10 reps - Seated Cervical Traction  - 1 x  daily - 7 x weekly - 1 sets - 10 reps - Seated Neck and Traction  - 1 x daily - 7 x weekly - 1 sets - 10 reps - Standing Low Shoulder Row with Anchored Resistance  - 1 x daily - 7 x weekly - 1 sets - 10 reps - Seated Thoracic Flexion and Rotation with Arms Crossed  - 1 x daily - 7 x weekly - 1 sets - 5 reps - Seated Thoracic Lumbar Extension  - 1 x daily - 7 x weekly - 1 sets - 5-10 reps - Supine Head Nod with Deep Neck Flexor Activation  - 1 x daily - 7 x weekly - 1 sets - 10 reps - Seated Deep Neck Flexor Nods  - 1 x daily - 7 x weekly - 1 sets - 10 reps ASSESSMENT:  CLINICAL IMPRESSION: Chakita verbalized compliance with updated HEP. Reviewed these exercises to ensure proper performance. Patient responds well to decompression exercises. Noted decreased muscle spasms in bilateral upper trap today. This could be because STM was performed supine instead of seated today. PT monitored patient throughout and provided verbal and visual cues as needed. Patient will benefit from skilled PT to address the below impairments and improve overall function.     OBJECTIVE IMPAIRMENTS: decreased activity tolerance, decreased mobility, decreased ROM, increased fascial restrictions, increased muscle spasms, impaired UE functional use, postural dysfunction, and pain.   ACTIVITY LIMITATIONS: carrying, lifting, bending, bed mobility, and reach over head  PERSONAL FACTORS: Age, Fitness, Past/current experiences, and 1 comorbidity: lymphedema are also affecting patient's functional outcome.   REHAB POTENTIAL: Good  CLINICAL DECISION MAKING: Stable/uncomplicated  EVALUATION COMPLEXITY: Low   GOALS: Goals reviewed with patient? No    SHORT TERM GOALS=LONG TERM GOALS: Target date: 12/26/2023  Patient to demonstrate independence in HEP  Baseline: [email or web address] Goal status: ongoing  2.  Patient will acknowledge 4/10 pain at least once during episode of care   Baseline: 8/10 Goal status:  ongoing  3.   Patient will score at least 20/50 on NDI to signify clinically meaningful improvement in functional abilities.   Baseline: 28/50 Goal status:  met 9/30  4.  160d AROM in B shoulder flexion/abduction Baseline: 150d Goal status: ongoing  5.  Improved cervical rotation ROM to 45 degrees needed for driving revised Goal status:   6. Be able to walk 1 hour or navigate hills at [facility] new  7. PSFS rating for computer work improved to 6-7 new  8. PSFS rating for cooking improved to 6 new   PLAN:  PT FREQUENCY: 1x/week  PT DURATION: 12 weeks   PLANNED INTERVENTIONS: 97110-Therapeutic exercises, 97530- Therapeutic activity, 97112- Neuromuscular re-education, 97535- Self Care, and [id number]- Manual therapy  PLAN FOR NEXT SESSION   deep cervical flexor strengthening;  postural strengthening; manual techniques as appropriate; pt states Dr. Duwayne gave her a referral for aquatic PT for lumbar spine  Kristeen Sar, PT, DPT 11/08/23 1:29 PM

## 2023-11-09 ENCOUNTER — Ambulatory Visit: Payer: Self-pay

## 2023-11-09 DIAGNOSIS — R279 Unspecified lack of coordination: Secondary | ICD-10-CM

## 2023-11-09 DIAGNOSIS — M6281 Muscle weakness (generalized): Secondary | ICD-10-CM

## 2023-11-09 DIAGNOSIS — R102 Pelvic and perineal pain unspecified side: Secondary | ICD-10-CM

## 2023-11-09 DIAGNOSIS — M542 Cervicalgia: Secondary | ICD-10-CM | POA: Diagnosis not present

## 2023-11-09 DIAGNOSIS — M62838 Other muscle spasm: Secondary | ICD-10-CM

## 2023-11-09 DIAGNOSIS — M5459 Other low back pain: Secondary | ICD-10-CM

## 2023-11-09 NOTE — Therapy (Signed)
 OUTPATIENT PHYSICAL THERAPY FEMALE PELVIC TREATMENT     Patient Name: Karen [name] MRN: [id number] DOB:[birth year], 53 y.o., female Today's Date: 11/09/2023  END OF SESSION:  PT End of Session - 11/09/23 0803     Visit Number 16    Date for Recertification  12/26/23    Authorization Type [name]    PT Start Time 0800    PT Stop Time [phone]    PT Time Calculation (min) 42 min    Activity Tolerance Patient tolerated treatment well    Behavior During Therapy [facility] for tasks assessed/performed                      Past Medical History:  Diagnosis Date   Adenomyosis    Anemia    Arthritis 2013   L knee gets steroid injections    Arthritis, rheumatic, acute or subacute    Asthmatic bronchitis 01/31/2017   Back pain    Chronic constipation    Diarrhea    DVT of lower extremity (deep venous thrombosis) (HCC)    Dysmenorrhea    Endometriosis    Esophageal stricture    G6PD deficiency    GERD (gastroesophageal reflux disease)    History of kidney stones    History of uterine fibroid    IBS (irritable bowel syndrome)    Interstitial cystitis    Lactose intolerance    Migraine    with aura   Other hemochromatosis 06/10/2021   PONV (postoperative nausea and vomiting)    Recurrent upper respiratory infection (URI)    Sebaceous cyst of breast, right lower inner quadrant 10/25/2012   Excised 11/21/12    Sleep apnea    Syncope, non cardiac    Past Surgical History:  Procedure Laterality Date   ARTHROSCOPIC HAGLUNDS REPAIR     BREAST CYST EXCISION Right 11/21/2012   Procedure: CYST EXCISION BREAST;  Surgeon: Sherlean JINNY Laughter, MD;  Location: [facility];  Service: General;  Laterality: Right;   BREAST CYST EXCISION Bilateral 01/18/2022   Procedure: EXCISION OF SEBACEOUS CYST BILATERAL BREAST;  Surgeon: Vanderbilt Ned, MD;  Location: [facility];  Service: General;  Laterality: Bilateral;   BREAST EXCISIONAL BIOPSY Right  11/2012   CESAREAN SECTION  04/19/1993   CHOLECYSTECTOMY     COLONOSCOPY  05/02/2011   Procedure: COLONOSCOPY;  Surgeon: Lynwood LITTIE Celestia Mickey., MD;  Location: [facility];  Service: Endoscopy;  Laterality: N/A;   colonoscopy  11/04/2014   DENTAL SURGERY  03/06/2018   2 surgeries ( 03/06/2018 and 04/06/2018)   to remove two separate benign tumors   ESOPHAGEAL MANOMETRY N/A 11/04/2015   Procedure: ESOPHAGEAL MANOMETRY (EM);  Surgeon: Lynwood Celestia, MD;  Location: [facility];  Service: Endoscopy;  Laterality: N/A;   ESOPHAGOGASTRODUODENOSCOPY  05/02/2011   Procedure: ESOPHAGOGASTRODUODENOSCOPY (EGD);  Surgeon: Lynwood LITTIE Celestia Mickey., MD;  Location: [facility];  Service: Endoscopy;  Laterality: N/A;   ESOPHAGOGASTRODUODENOSCOPY N/A 09/01/2014   Procedure: ESOPHAGOGASTRODUODENOSCOPY (EGD);  Surgeon: Lynwood Celestia, MD;  Location: [facility];  Service: Endoscopy;  Laterality: N/A;   IR CV LINE INJECTION  09/13/2023   IR IMAGING GUIDED PORT INSERTION  07/06/2023   KNEE SURGERY     LAPAROSCOPY     x 4   LESION REMOVAL N/A 01/18/2022   Procedure: EXCISION OF SEBACEOUS CYSTS CHEST AND NECK;  Surgeon: Vanderbilt Ned, MD;  Location: [facility];  Service: General;  Laterality: N/A;   PH IMPEDANCE STUDY N/A  11/04/2015   Procedure: PH IMPEDANCE STUDY;  Surgeon: Lynwood Bohr, MD;  Location: [facility];  Service: Endoscopy;  Laterality: N/A;   VAGINAL HYSTERECTOMY  2010   TVH--ovaries remain   Patient Active Problem List   Diagnosis Date Noted   Sjogren syndrome with keratoconjunctivitis 12/09/2022   Other hemochromatosis 06/10/2021   Elevated LFTs 04/04/2021   Colitis 04/04/2021   Bacterial overgrowth syndrome 05/21/2020   Obesity 05/21/2020   History of endometriosis 05/21/2020   Constipation due to outlet dysfunction 02/05/2020   Gastroesophageal reflux disease 02/05/2020   Obstructive sleep apnea treated with continuous positive airway pressure (CPAP) 06/16/2017   Perennial  allergic rhinitis with a nonallergic component 01/31/2017   Asthmatic bronchitis 01/31/2017   GI symptoms 01/31/2017   Family history of colon cancer 01/27/2015   Chest pain 12/13/2012   Dyspnea 12/13/2012   DVT of lower extremity (deep venous thrombosis) (HCC) 01/03/1990    PCP: Elliot Charm, MD  REFERRING PROVIDER: Cleotilde Ronal RAMAN, MD   REFERRING DIAG: M62.89 (ICD-10-CM) - Pelvic floor dysfunction  THERAPY DIAG:  Other muscle spasm  Muscle weakness (generalized)  Other low back pain  Pelvic pain  Unspecified lack of coordination  Rationale for Evaluation and Treatment: Rehabilitation  ONSET DATE: chronic  SUBJECTIVE:                                                                                                                                                                                           SUBJECTIVE STATEMENT:  Pt has gotten lymphedema pump and started to use. She states that she has been having more Rt sided lower abdominal and hip pain.   PAIN: 11/09/23 Are you having pain? Yes NPRS scale: 4/10 Pain location: Lower abdominal pain, Lt sided pain, low back pain  Pain type: turning, knotted, pressure Pain description: intermittent and constant   Aggravating factors: constipation or frequent bowel movements/constantly having bowel movements  Relieving factors: exercises (bowel massage, happy baby, spinal twists, walking)  PRECAUTIONS: None  WEIGHT BEARING RESTRICTIONS: No  FALLS:  Has patient fallen in last 6 months? No  LIVING ENVIRONMENT: Lives with: lives with their spouse Lives in: House/apartment  OCCUPATION: nurse, in admin  PLOF: Independent  PATIENT GOALS: decrease pain and have more normal bowel movements  PERTINENT HISTORY:  Vaginal hysterectomy, DVT LE, endometriosis, interstitial cystitis, exploratory laps for endometriosis, c-section, cholecystectomy, RA diagnosed 06/2023 Sexual abuse: No  BOWEL MOVEMENT: Pain with  bowel movement: Yes Type of bowel movement:Type (Bristol Stool Scale) 1-7 (sometimes full range in the same bowel movement), Frequency sometimes many times a day, sometimes up to 4 days in between, and Strain  Yes Fully empty rectum: No Leakage: No Pads: No Fiber supplement: No - has attempted metamucil and it made constipation worse  URINATION: Pain with urination: Yes Fully empty bladder: No Stream: varies  Urgency: Yes: all the time Frequency: multiple times an hours  Leakage: Urge to void, Walking to the bathroom, Coughing, Sneezing, Laughing, and Exercise Pads: Yes: daily  INTERCOURSE: Pain with intercourse: Initial Penetration and During Penetration Ability to have vaginal penetration:  Yes: with pain Climax: non painful WNL   PREGNANCY: Vaginal deliveries 1 Tearing Yes: 3rd degree tear C-section deliveries 1 Currently pregnant No  PROLAPSE: Periodically will feel vaginal bulging, heaviness in lower abdomen   OBJECTIVE:  10/10/23 Curl-up test: Minimal abdominal distortion focused in upper abdominals, largely improved Transversus abdominus: good core facilitation with exhale Sit-up test: 1/3  LUMBARAROM/PROM:  A/PROM A/PROM  Eval (% available) 11/09/22 (% available) 04/25/23 (% available) 10/10/23 (% available)  Flexion 50 90 90 100  Extension 25, pain anteriorly  25, pain in low back 50 75  Right lateral flexion 50, pain anteriorly  75, pain on right 50, pain on Rt 75  Left lateral flexion 50, pain anteriorly  75, pain on right 75 75   (Blank rows = not tested)    05/04/23:               Internal Pelvic Floor: mild tenderness in bil levator ani, but no increase in tension; with internal rectal exam, she was demonstrating paradoxical contraction when trying to eccentrically lengthen with exhale and had very hard time correcting  Patient confirms identification and approves PT to assess internal pelvic floor and treatment Yes  PELVIC MMT:   MMT eval  Vaginal 2/5,  3 second hold, 3 repeat contractions  Internal Anal Sphincter 1/5  External Anal Sphincter 2/5  Puborectalis 1/5  Diastasis Recti   (Blank rows = not tested)        TONE: WNL   PROLAPSE: Grade 2 anterior vaginal wall laxity  04/25/23: Curl-up test: abdominal distortion Transversus abdominus: week, difficulty getting active contraction (not been working on strengthening)    LUMBARAROM/PROM:  A/PROM A/PROM  Eval (% available) 11/09/22 (% available) 04/25/23 (% available)  Flexion 50 90 90  Extension 25, pain anteriorly  25, pain in low back 50  Right lateral flexion 50, pain anteriorly  75, pain on right 50, pain on Rt  Left lateral flexion 50, pain anteriorly  75, pain on right 75   (Blank rows = not tested)  11/09/22: No internal rectal or vaginal pelvic floor exam performed due to high level of skin irritation   Weak transversus abdominus contraction  Abdominal restriction and tenderness in bil lower quadrant  Unable to perform pelvic tilt with appropriate coordination  LUMBARAROM/PROM:  A/PROM A/PROM  Eval (% available) 11/09/22 (% available)  Flexion 50 90  Extension 25, pain anteriorly  25, pain in low back  Right lateral flexion 50, pain anteriorly  75, pain on right  Left lateral flexion 50, pain anteriorly  75, pain on right   (Blank rows = not tested) 07/21/22: standing prolapse assessment demonstrated grade 2 anterior vaginal wall laxity   06/08/22:               External Perineal Exam: WNL                              Internal Pelvic Floor: burning reported with palpation of superficial muscles; deep  aching/pressure with palpation of Lt levator ani   Patient confirms identification and approves PT to assess internal pelvic floor and treatment Yes  PELVIC MMT:   MMT eval  Vaginal 3/5  Internal Anal Sphincter 1/5  External Anal Sphincter 2/5  Puborectalis 1/5  Diastasis Recti   (Blank rows = not tested)        TONE: High in Lt levator  ani   PROLAPSE: Not able to tell this session due to dyssynergic pelvic floor contraction     06/01/22: COGNITION: Overall cognitive status: Within functional limits for tasks assessed     SENSATION: Light touch: Appears intact Proprioception: Appears intact  GAIT: Comments: decreased hip extension, forward flexed trunk  POSTURE: rounded shoulders, forward head, decreased lumbar lordosis, increased thoracic kyphosis, posterior pelvic tilt, and flexed trunk    LUMBARAROM/PROM:  A/PROM A/PROM  Eval (% available)  Flexion 50  Extension 25, pain anteriorly   Right lateral flexion 50, pain anteriorly   Left lateral flexion 50, pain anteriorly    (Blank rows = not tested)   PALPATION:   General  Significant abdominal restriction and tenderness; decreased rib mobility with mobilization and breathing    TODAY'S TREATMENT:                                                                                                                              DATE:  11/09/23 Manual: Lt side lying Rt hip soft tissue mobilization  Supine: ILU bowel mobilization  Abdominal scar tissue mobilization Ileocecal valve mobilization External rectum mobilization Diaphragm release Exercises: Supine resisted march + red band 3 x 10 Supine resisted bil hip abduction + red band 2 x 10 Supine unilateral resisted bent knee fall out + red band 2 x 10 bil Bridge + hip adduction 2 x 10 Supine leg extension 10x bil Supine weight drop + 5lbs 2 x 10   11/01/23 Manual: Supine: ILU bowel mobilization  Abdominal scar tissue mobilization Ileocecal valve mobilization External rectum mobilization Diaphragm release Neuromuscular re-education: Bridge with hip adduction, transversus abdominus, and pelvic floor muscle 2 x 10 Supine unilateral bent knee fall out 2 x 10 bil Supine resisted march + green band 2 x 10 Exercises: Sidelying: Clam shells 2 x 10 bil Reverse clam shells 2 x 10 bil Hip  abduction 2 x 10 bil Open books 10x bil  10/23/23 Manual: Supine: ILU bowel mobilization  Abdominal scar tissue mobilization Ileocecal valve mobilization External rectum mobilization Diaphragm release Neuromuscular re-education: Seated hip adduction ball press with transversus abdominus and pelvic floor muscle 2 x 10 Seated hip abduction red band with transversus abdominus and pelvic floor muscle 2 x 10 Seated resisted march red band with transversus abdominus and pelvic floor muscle 2 x 10 Seated unilateral hip abduction + red band 15x bil Seated thoracic rotations for abdominal mobility 10x Supine leg extensions 10x bil   PATIENT EDUCATION:  Education details: See above Person educated: Patient Education method: Explanation,  Demonstration, Tactile cues, Verbal cues, and Handouts Education comprehension: verbalized understanding  HOME EXERCISE PROGRAM: [id number]  ASSESSMENT:  CLINICAL IMPRESSION: Pt having more soreness and Rt hip/lower abdominal pain this week. She had increase in diarrhea last week that may have had to do with prednisone  she was still taking. She did well with all strengthening activities today without increase in pain. We focused manual techniques to Rt hip due to pain there as well as abdomen with good tolerance and improvement in soreness. She will continue to benefit from skilled PT intervention in order to decrease pain, improve bowel movements, and promote independence with HEP.     OBJECTIVE IMPAIRMENTS: decreased activity tolerance, decreased coordination, decreased endurance, decreased mobility, decreased strength, increased fascial restrictions, increased muscle spasms, impaired tone, postural dysfunction, and pain.   ACTIVITY LIMITATIONS: lifting, bending, continence, and locomotion level  PARTICIPATION LIMITATIONS: interpersonal relationship, community activity, and occupation  PERSONAL FACTORS: 1 comorbidity: medical history are also affecting  patient's functional outcome.   REHAB POTENTIAL: Good  CLINICAL DECISION MAKING: Stable/uncomplicated  EVALUATION COMPLEXITY: Low   GOALS: Goals reviewed with patient? Yes  SHORT TERM GOALS: Updated 10/10/23  Pt will be independent with HEP.   Baseline: Goal status: MET 07/12/22  2.  Pt will be independent with diaphragmatic breathing and down training activities in order to improve pelvic floor relaxation.  Baseline:  Goal status: MET 07/12/22  3.  Pt will be independent with the knack, urge suppression technique, and double voiding in order to improve bladder habits and decrease urinary incontinence.   Baseline:  Goal status: MET 09/22/22  4.  Pt will be independent with use of squatty potty, relaxed toileting mechanics, and improved bowel movement techniques in order to increase ease of bowel movements and complete evacuation.   Baseline:  Goal status: MET 11/09/22  5.  Pt will be able to correctly perform diaphragmatic breathing and appropriate pressure management in order to prevent worsening vaginal wall laxity and improve pelvic floor A/ROM.   Baseline:  Goal status: MET 01/18/23  LONG TERM GOALS: Updated 10/10/23  Pt will be independent with advanced HEP.   Baseline:  Goal status: IN PROGRESS 10/10/23  2.  Pt will demonstrate normal pelvic floor muscle tone and A/ROM, able to achieve 4/5 strength with contractions and 10 sec endurance, in order to provide appropriate lumbopelvic support in functional activities.   Baseline: not assessed today per patient request - no current bladder issues and they have all resolved Goal status: IN PROGRESS 10/10/23  3.  Pt will increase all impaired lumbar A/ROM by 25% without pain.  Baseline: Pt seen a little bit of loss in some and improvement in extension Goal status:  IN PROGRESS 10/10/23  4.  Pt will report pain no higher than 3/10 with any activity. Baseline: pain currently 3/10, worst is an 8/10 and often corresponds to  after she is eating Goal status:  IN PROGRESS 10/10/23  5.  Pt will report 0/10 pain with vaginal penetration in order to improve intimate relationship with partner.    Baseline: no recent intercourse attempts  Goal status:  IN PROGRESS 10/10/23  6.  Pt will be able to go 2-3 hours in between voids without urgency or incontinence in order to improve QOL and perform all functional activities with less difficulty.   Baseline:  Goal status: MET 02/21/23  7.  Pt will report no leaks with laughing, coughing, sneezing in order to improve comfort with interpersonal relationships and community activities.  Baseline: Pt states that she feels 30-40% better Goal status:  MET 10/10/23  8.  Pt will have consistent bowel movements 4-5x/week without straining or pain.  Baseline:  Goal status:  MET 02/21/23  PLAN:  PT FREQUENCY: 1-2x/week  PT DURATION: 6 months  PLANNED INTERVENTIONS: Therapeutic exercises, Therapeutic activity, Neuromuscular re-education, Balance training, Gait training, Patient/Family education, Self Care, Joint mobilization, Dry Needling, Biofeedback, and Manual therapy  PLAN FOR NEXT SESSION: progress strengthening program; prepare for discharge in the next 6-8 visits   Josette Mares, PT, DPT11/06/258:04 AM

## 2023-11-10 DIAGNOSIS — M5459 Other low back pain: Secondary | ICD-10-CM | POA: Diagnosis not present

## 2023-11-10 DIAGNOSIS — E2839 Other primary ovarian failure: Secondary | ICD-10-CM | POA: Diagnosis not present

## 2023-11-10 DIAGNOSIS — M545 Low back pain, unspecified: Secondary | ICD-10-CM | POA: Diagnosis not present

## 2023-11-10 DIAGNOSIS — M542 Cervicalgia: Secondary | ICD-10-CM | POA: Diagnosis not present

## 2023-11-10 DIAGNOSIS — M5030 Other cervical disc degeneration, unspecified cervical region: Secondary | ICD-10-CM | POA: Diagnosis not present

## 2023-11-13 ENCOUNTER — Encounter (HOSPITAL_BASED_OUTPATIENT_CLINIC_OR_DEPARTMENT_OTHER): Payer: Self-pay | Admitting: Obstetrics & Gynecology

## 2023-11-13 ENCOUNTER — Ambulatory Visit: Admitting: Occupational Therapy

## 2023-11-13 ENCOUNTER — Encounter: Payer: Self-pay | Admitting: Occupational Therapy

## 2023-11-13 ENCOUNTER — Other Ambulatory Visit: Payer: Self-pay | Admitting: *Deleted

## 2023-11-13 DIAGNOSIS — Z95828 Presence of other vascular implants and grafts: Secondary | ICD-10-CM

## 2023-11-13 DIAGNOSIS — I890 Lymphedema, not elsewhere classified: Secondary | ICD-10-CM

## 2023-11-13 DIAGNOSIS — R790 Abnormal level of blood mineral: Secondary | ICD-10-CM

## 2023-11-13 DIAGNOSIS — E860 Dehydration: Secondary | ICD-10-CM

## 2023-11-13 DIAGNOSIS — E039 Hypothyroidism, unspecified: Secondary | ICD-10-CM

## 2023-11-13 DIAGNOSIS — K6389 Other specified diseases of intestine: Secondary | ICD-10-CM

## 2023-11-13 DIAGNOSIS — R079 Chest pain, unspecified: Secondary | ICD-10-CM

## 2023-11-13 NOTE — Progress Notes (Signed)
 Entered in Error

## 2023-11-13 NOTE — Therapy (Signed)
 OUTPATIENT OCCUPATIONAL THERAPY TREATMENT NOTE  BLE/ BLQ LYMPHEDEMA  Patient Name: Karen [name] MRN: [id number] DOB:[birth year], 53 y.o., female Today's Date: 11/13/2023  REPORTING PERIOD: 10/30/23 -   END OF SESSION:  OT End of Session - 11/13/23 1108     Visit Number 62    Number of Visits 72    Date for Recertification  11/16/23    OT Start Time 1105    OT Stop Time 1205    OT Time Calculation (min) 60 min    Activity Tolerance Patient tolerated treatment well;No increased pain    Behavior During Therapy [facility] for tasks assessed/performed           Past Medical History:  Diagnosis Date   Adenomyosis    Anemia    Arthritis 2013   L knee gets steroid injections    Arthritis, rheumatic, acute or subacute    Asthmatic bronchitis 01/31/2017   Back pain    Chronic constipation    Diarrhea    DVT of lower extremity (deep venous thrombosis) (HCC)    Dysmenorrhea    Endometriosis    Esophageal stricture    G6PD deficiency    GERD (gastroesophageal reflux disease)    History of kidney stones    History of uterine fibroid    IBS (irritable bowel syndrome)    Interstitial cystitis    Lactose intolerance    Migraine    with aura   Other hemochromatosis 06/10/2021   PONV (postoperative nausea and vomiting)    Recurrent upper respiratory infection (URI)    Sebaceous cyst of breast, right lower inner quadrant 10/25/2012   Excised 11/21/12    Sleep apnea    Syncope, non cardiac    Past Surgical History:  Procedure Laterality Date   ARTHROSCOPIC HAGLUNDS REPAIR     BREAST CYST EXCISION Right 11/21/2012   Procedure: CYST EXCISION BREAST;  Surgeon: Sherlean JINNY Laughter, MD;  Location: [facility];  Service: General;  Laterality: Right;   BREAST CYST EXCISION Bilateral 01/18/2022   Procedure: EXCISION OF SEBACEOUS CYST BILATERAL BREAST;  Surgeon: Vanderbilt Ned, MD;  Location: [facility];  Service: General;  Laterality: Bilateral;   BREAST  EXCISIONAL BIOPSY Right 11/2012   CESAREAN SECTION  04/19/1993   CHOLECYSTECTOMY     COLONOSCOPY  05/02/2011   Procedure: COLONOSCOPY;  Surgeon: Lynwood LITTIE Celestia Mickey., MD;  Location: [facility];  Service: Endoscopy;  Laterality: N/A;   colonoscopy  11/04/2014   DENTAL SURGERY  03/06/2018   2 surgeries ( 03/06/2018 and 04/06/2018)   to remove two separate benign tumors   ESOPHAGEAL MANOMETRY N/A 11/04/2015   Procedure: ESOPHAGEAL MANOMETRY (EM);  Surgeon: Lynwood Celestia, MD;  Location: [facility];  Service: Endoscopy;  Laterality: N/A;   ESOPHAGOGASTRODUODENOSCOPY  05/02/2011   Procedure: ESOPHAGOGASTRODUODENOSCOPY (EGD);  Surgeon: Lynwood LITTIE Celestia Mickey., MD;  Location: [facility];  Service: Endoscopy;  Laterality: N/A;   ESOPHAGOGASTRODUODENOSCOPY N/A 09/01/2014   Procedure: ESOPHAGOGASTRODUODENOSCOPY (EGD);  Surgeon: Lynwood Celestia, MD;  Location: [facility];  Service: Endoscopy;  Laterality: N/A;   IR CV LINE INJECTION  09/13/2023   IR IMAGING GUIDED PORT INSERTION  07/06/2023   KNEE SURGERY     LAPAROSCOPY     x 4   LESION REMOVAL N/A 01/18/2022   Procedure: EXCISION OF SEBACEOUS CYSTS CHEST AND NECK;  Surgeon: Vanderbilt Ned, MD;  Location: [facility];  Service: General;  Laterality: N/A;   PH IMPEDANCE STUDY N/A 11/04/2015   Procedure:  PH IMPEDANCE STUDY;  Surgeon: Lynwood Bohr, MD;  Location: [facility];  Service: Endoscopy;  Laterality: N/A;   VAGINAL HYSTERECTOMY  2010   TVH--ovaries remain   Patient Active Problem List   Diagnosis Date Noted   Sjogren syndrome with keratoconjunctivitis 12/09/2022   Other hemochromatosis 06/10/2021   Elevated LFTs 04/04/2021   Colitis 04/04/2021   Bacterial overgrowth syndrome 05/21/2020   Obesity 05/21/2020   History of endometriosis 05/21/2020   Constipation due to outlet dysfunction 02/05/2020   Gastroesophageal reflux disease 02/05/2020   Obstructive sleep apnea treated with continuous positive airway pressure (CPAP)  06/16/2017   Perennial allergic rhinitis with a nonallergic component 01/31/2017   Asthmatic bronchitis 01/31/2017   GI symptoms 01/31/2017   Family history of colon cancer 01/27/2015   Chest pain 12/13/2012   Dyspnea 12/13/2012   DVT of lower extremity (deep venous thrombosis) (HCC) 01/03/1990   PCP: Gaile New, MD  REFERRING PROVIDER: Valery Ripple, MD  REFERRING DIAG: I89.0  THERAPY DIAG:  Lymphedema, not elsewhere classified  Rationale for Evaluation and Treatment: Rehabilitation  ONSET DATE: ~2014; exacerbation in 2023  SUBJECTIVE:                                                                                                                                                                                          SUBJECTIVE STATEMENT:  Pt presents for OT to address lower quadrant, including deep abdominal lymphatics, and LLE lymphedema w/ associated pain. Pt denies LE related pain today. Pt reports she was active all day on Friday and then got a migraine which kept her at home all weekend. Pt reports she received her Flexitouch device, but the trainer set it up for BLE, instead of being able to alternate on one lower extremity at a time. The trainer told Pt that's how the order was written, so Pt has not been able to use the device since it was delivered. OT emailed [employer] rep asking for assistance with correct set up.  PERTINENT HISTORY: CVI, OSA (no CPAP), GERD, Esophageal stricture, IBS, Lactose intolerant, Diarrhea, Hx LE DVT, recent RA dx, OA  PAIN:  Are you having pain? Yes: 0/10- not rated numerically Pain location: abdomen; digestive discomfort Pain description: abdominal fullness, tightness Aggravating factors: standing, walking,  Relieving factors: MLD, Miralax ,   PRECAUTIONS: Other: LYMPHEDEMA; Hx LLE DVT  WEIGHT BEARING RESTRICTIONS: No  FALLS:  Has patient fallen in last 6 months? No  LIVING ENVIRONMENT: Lives with: lives with spouse and  daughter Lives in: House/apartment Stairs: Yes; Internal: 14 steps; yes and External: 4 steps; yes Has following equipment at home: None  OCCUPATIONTheatre Manager,  walking, desk work  LEISURE: family time  HAND DOMINANCE: right   PRIOR LEVEL OF FUNCTION: Independent  PATIENT GOALS: Feel better, be able to be more active without pain, wear preferred street shoes. NEW 08/03/23: Be able to manage digestive discomfort and gut inflammation symptoms without medications  OBJECTIVE: Note: Objective measures were completed at Evaluation unless otherwise noted.  COGNITION:  Overall cognitive status: Within functional limits for tasks assessed   POSTURE: WFL  LE ROM: WFL  LE MMT: WFL  LYMPHEDEMA ASSESSMENTS: non-Ca related  INFECTIONS: denies cellulitis and wound hx  BLE COMPARATIVE LIMB VOLUMETRICS Initial 11/15/22  LANDMARK RIGHT (dominant)  R LEG (A-D) 3028.9 ml  R THIGH (E-G) 4809.60 ml  R FULL LIMB (A-G) 7838.5 ml  Limb Volume differential (LVD)  %  Volume change since initial %  Volume change overall V  (Blank rows = not tested)  LANDMARK LEFT  (Rx)  L LEG (A-D) 3189.9 ml  L THIGH (E-G) 4959.8 ml  L FULL LIMB (A-G) 8149.7 ml  Limb Volume differential (LVD)  LEG LVD = 5.32%, L>R; THIGH LVD = 3.12%, L>R; And full LLE LVD = 3.97%, L>R.   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  Skin  Description Hyper-Keratosis Peau d' Orange Shiny Tight Fibrotic/ Indurated Fatty Doughy Spongy/ boggy       R>L x  x   Skin dry Flaky WNL Macerated   mildly      Color Redness  Varicosities Blanching Hemosiderin Stain Mottled        x   Odor Malodorous Yeast Fungal infection  WNL      x   Temperature Warm Cool wnl     x    Pitting Edema   1+ 2+ 3+ 4+ Non-pitting         x   Girth Symmetrical Asymmetrical                   Distribution    L>R toes to groin    Stemmer Sign Positive Negative   +L -R   Lymphorrhea History Of:  Present Absent     x    Wounds History Of Present Absent Venous Arterial Pressure Sheer     x        Signs of Infection Redness Warmth Erythema Acute Swelling Drainage Borders                    Sensation Light Touch Deep pressure Hypersensitivity   In tact Impaired In tact Impaired Absent Impaired   x  x  x     Nails WNL   Fungus nail dystrophy   x     Hair Growth Symmetrical Asymmetrical   x    Skin Creases Base of toes  Ankles   Base of Fingers knees       Abdominal pannus Thigh Lobules  Face/neck   x           GAIT: Distance walked: >500'  Assistive device utilized: None Level of assistance: Complete Independence Comments: Functional ambulation and transfers [facility]  LYMPHEDEMA LIFE IMPACT SCALE (LLIS): Initial 11/15/22  50%  FOTO functional outcome measure: Deferred. FOTO discontinued.  TODAY'S TREATMENT:                                                                                                                                         MLD to abdominal lymphatics Pt edu for LE self care  PATIENT EDUCATION:  Continued Pt/ CG edu for how function of cisterna chyli, part of the thoracic duct of deep abdominal lymphatics, assists with digestion by filtering many of the fats and proteins from the digestive system. Sub optimal lymphatic flow in this region may result in constipation, bloating, swelling, etc. MLD helps mobilize these protein and fats , and the rhythmic movement of manual lymphatic drainage stimulates digestive muscles and increase peristalsis. Provided printed resource for reference.   Topics include outcome of comparative limb volumetrics- starting limb volume differentials (LVDs), technology and gradient techniques used for short stretch, multilayer compression wrapping, simple self-MLD, therapeutic lymphatic pumping exercises, skin/nail care, LE precautions,. compression garment recommendations and specifications, wear and care schedule and compression garment donning / doffing w assistive devices. Discussed progress towards all OT goals since commencing CDT. All questions answered to the Pt's satisfaction. Good return. Person educated: Patient  Education method: Explanation, Demonstration, and Handouts Education comprehension: verbalized understanding, returned demonstration, verbal cues required, and needs further education  [facility] PRECAUTIONS: Pt education re precautions related to use of advanced sequential pneumatic compression device. Pt verbalized understanding that she should never use device on 2 arms/legs simultaneously and should not use device 2 x on same day to avoid overloading her heart with fluid volume return both at present, and in years to come should her medical condition change. Pt instructed to remove device immediately should she experience atypical SOP, light headedness, or acute pain. Pt instructed to discontinue pump if she suspects, or has any infection, blood clot, cellulitis, the flu, corona virus, etc.   HOME EXERCISE PROGRAM: BLE lymphatic pumping there ex using- 1 sets of 10 reps, each exercise in order-  1-2 x daily, bilaterally Simple self MLD 1 x daily Daily skin care to increase hydration, skin mobility and decrease infection risk- can be done during MLD Compression wraps 23/7 during intensive phase of CDT Compression garments/ devices during self-management phase of CDT  ASSESSMENT:  CLINICAL IMPRESSION: Pt tolerated deep abdominal MLD with very gentle short neck sequence.  Slight R lateral neck swelling is noted again today. No SOB,  dizziness, or increased pain observed or reported. Pt continues to have digestive discomfort associated with chronic constipation alternating with diarrhea.  Pt tolerated the abdominal sequence as established without increased pain. Pt continues to  benefit from MLD for enhancing gut motility and bowel function.  MLD is also known to facilitate the removal of  inflammatory substances and toxins from the intestinal area, which may help reduce inflammation linked to digestive disorders. Cont  1 x weekly through next visit, then decrease frequency to every other week for one month. Continue family training with Pt's daughter for simple MLD at home between sessions. Pt is working on obtaining compression device. Pt in agreement with this plan to reduce frequency.    (10/30/23 OT Progress Note: Pls review GOALS section below for additional details regarding progress towards goals. Pt understands and can apply lymphedema precautions after skilled training. Pt remains 100% compliant with lymphedema self care. Because lymphatic massage to one's own deep abdomen is challenging, especially with orthopedic issues Pt has had this year, Pt's daughter is learning to assist her mom with simple self MLD. Pt has also undergone a trial of the advanced Flexitouch PLUS sequential pneumatic compression device which features an abdominal, truncal, buttocks component above the level of the inguinal LN to facilitate fluid congestion towards the thoracic duct and terminus. Pt felt this helpful to her during the trial and she's hoping for insurance approval. Pt has obtained the recommended knee length compression garment and wears it for prolonged walking and dependent positioning. Limb volume measurement in the lower extremity for comparison was not performed today due to time constraints, but by visual assessment it appears unchanged. Abdominal swelling and tightness is also unchanged by visual assessment and palpation. Pt has reached a  clinical plateau and he agrees with plan to taper treatment to transition to LE self management phase of CDT. After her next session we'll reduce OT for every other week for a couple of weeks, then further reduce PRN.   (11/15/22 Initial Evaluation: Sherin Murdoch is a 53 y.o. female presenting with very mild, stage II, LLE lymphedema 2/2 venous insufficiency and obesity. L Leg swelling and associated pain swelling fluctuates. It has progressed over time, and now no longer resolves with elevation over night. RLE swelling limits balance during functional ambulation. It exacerbates infection and falls risk. LLE/RLQ lymphedema interferes with functional performance in all occupational domains, including basic and instrumental ADLs, productive activities, leisure pursuits and social participation. Pt will benefit from Occupational Therapy for Complete Decongestive Therapy (CDT) to restore function, to reduce physical and psychologic suffering associated with chronic, progressive lymphedema and associated pain, and to limit infection. CDT will include manual lymphatic drainage (MLD), skin care, therapeutic exercise and compression wraps and garment's devices. Without skilled OT for lymphedema care the condition will worsen and further functional decline is expected  Custom-made gradient compression garments and HOS devices are medically necessary because they are uniquely sized and shaped to fit the exact dimensions of the affected extremities, and to provide appropriate medical grade, graduated compression essential for optimally managing chronic, progressive lymphedema. Multiple custom compression garments are needed to ensure proper hygiene to limit infection risk. Custom compression garments should be replaced q 3-6 months When worn consistently for optimal lipo-lymphedema self-management over time. HOS devices, medically necessary to limit fibrosis buildup in tissue, should be replaced q 2 years and PRN when  worn out.      OBJECTIVE IMPAIRMENTS: decreased activity tolerance, decreased knowledge of condition, decreased knowledge of use of DME, increased edema, impaired sensation, pain, and chronic, progressive leg swelling.   ACTIVITY LIMITATIONS: dependent sitting, extended standing and/ or walking, squatting, and lower body dressing, fitting preferred street shoes  PARTICIPATION LIMITATIONS: shopping, community activity, occupation, and yard work  PERSONAL FACTORS: 3+ comorbidities: Hx DVT,  OSA, CVI. Varicose veins are  also affecting patient's functional outcome.   REHAB POTENTIAL: Good  EVALUATION COMPLEXITY: Moderate   GOALS: Goals reviewed with patient? Yes  SHORT TERM GOALS: Target date: 4th OT Rx visit  SHORT TERM GOALS: Target date: 4th OT Rx visit   Pt will demonstrate understanding of lymphedema precautions and prevention strategies with modified independence using a printed reference to identify at least 5 precautions and discussing how s/he may implement them into daily life to reduce risk of progression with extra time. Baseline:Max A Goal status: GOAL MET   Pt will be able to apply multilayer, knee length, gradient, compression wraps to one leg at a time with modified assistance (extra time and assistive device/s) to decrease limb volume, to limit infection risk, and to limit lymphedema progression.  Baseline: Dependent Goal status: GOAL DISCHARGED. Pt Going to compression garments instead  LONG TERM GOALS: Target date: 02/08/23  Given this patient's Intake score of 67%/100% on the functional outcomes FOTO tool, patient will experience an increase in function of 5 points to improve basic and instrumental ADLs performance, including lymphedema self-care.  Baseline: Max A Goal status:GOAL DISCHARGED.  FOTO TOOL DISCONTINUED AT CLINIC   Given this patient's Intake score of 50% on the Lymphedema Life Impact Scale (LLIS), patient will experience a reduction of at least 5  points in her perceived level of functional impairment resulting from lymphedema to improve functional performance and quality of life (QOL). Baseline: 50% Goal status: PROGRESSING  Pt will achieve at least a 10% volume reduction in B legs to return limb to typical size and shape, to limit infection risk and LE progression, to decrease pain, to improve function. Baseline: Dependent Goal status: PROGRESSING  4.  Pt will obtain proper compression garments/devices and achieve modified independence (extra time + assistive devices) with donning/doffing to optimize limb volume reductions and limit LE  progression over time. Baseline:  Goal status:GOAL MET  5.  During Intensive phase CDT , with modified independence, Pt will achieve at least 85% compliance with all lymphedema self-care home program components, including daily skin care, compression wraps and /or garments, simple self MLD and lymphatic pumping therex to habituate LE self care protocol  into ADLs for optimal LE self-management over time. Baseline: Dependent Goal status:GOAL MET   PLAN:  OT FREQUENCY: 1 /week  OT DURATION: 12 weeks and PRN  PLANNED INTERVENTIONS: 97110-Therapeutic exercises, 97530- Therapeutic activity, 97535- Self Care, [id number]- Manual therapy, Patient/Family education, Manual lymph drainage, Compression bandaging, and fit with appropriate compression garments  PLAN FOR NEXT SESSION:  MLD as established Pt and family edu for LE self-care  Zebedee Dec, MS, OTR/L, CLT-LANA 11/13/23 12:57 PM

## 2023-11-14 ENCOUNTER — Inpatient Hospital Stay

## 2023-11-14 ENCOUNTER — Inpatient Hospital Stay: Attending: Hematology & Oncology

## 2023-11-14 ENCOUNTER — Inpatient Hospital Stay (HOSPITAL_BASED_OUTPATIENT_CLINIC_OR_DEPARTMENT_OTHER): Admitting: Hematology & Oncology

## 2023-11-14 ENCOUNTER — Encounter: Payer: Self-pay | Admitting: Hematology & Oncology

## 2023-11-14 DIAGNOSIS — R790 Abnormal level of blood mineral: Secondary | ICD-10-CM

## 2023-11-14 DIAGNOSIS — R079 Chest pain, unspecified: Secondary | ICD-10-CM

## 2023-11-14 DIAGNOSIS — K6389 Other specified diseases of intestine: Secondary | ICD-10-CM

## 2023-11-14 DIAGNOSIS — Z95828 Presence of other vascular implants and grafts: Secondary | ICD-10-CM

## 2023-11-14 DIAGNOSIS — E039 Hypothyroidism, unspecified: Secondary | ICD-10-CM

## 2023-11-14 DIAGNOSIS — E860 Dehydration: Secondary | ICD-10-CM

## 2023-11-14 LAB — CMP (CANCER CENTER ONLY)
ALT: 17 U/L (ref 0–44)
AST: 20 U/L (ref 15–41)
Albumin: 4.4 g/dL (ref 3.5–5.0)
Alkaline Phosphatase: 124 U/L (ref 38–126)
Anion gap: 12 (ref 5–15)
BUN: 11 mg/dL (ref 6–20)
CO2: 24 mmol/L (ref 22–32)
Calcium: 9.4 mg/dL (ref 8.9–10.3)
Chloride: 106 mmol/L (ref 98–111)
Creatinine: 0.69 mg/dL (ref 0.44–1.00)
GFR, Estimated: >60 mL/min (ref >60)
Glucose, Bld: 96 mg/dL (ref 70–99)
Potassium: 3.9 mmol/L (ref 3.5–5.1)
Sodium: 141 mmol/L (ref 135–145)
Total Bilirubin: 0.5 mg/dL (ref 0.0–1.2)
Total Protein: 7.3 g/dL (ref 6.5–8.1)

## 2023-11-14 LAB — CBC WITH DIFFERENTIAL (CANCER CENTER ONLY)
Abs Immature Granulocytes: 0.01 K/uL (ref 0.00–0.07)
Basophils Absolute: 0 K/uL (ref 0.0–0.1)
Basophils Relative: 1 %
Eosinophils Absolute: 0.1 K/uL (ref 0.0–0.5)
Eosinophils Relative: 3 %
HCT: 35.1 % — ABNORMAL LOW (ref 36.0–46.0)
Hemoglobin: 11.6 g/dL — ABNORMAL LOW (ref 12.0–15.0)
Immature Granulocytes: 0 %
Lymphocytes Relative: 31 %
Lymphs Abs: 1.7 K/uL (ref 0.7–4.0)
MCH: 32.5 pg (ref 26.0–34.0)
MCHC: 33 g/dL (ref 30.0–36.0)
MCV: 98.3 fL (ref 80.0–100.0)
Monocytes Absolute: 0.5 K/uL (ref 0.1–1.0)
Monocytes Relative: 9 %
Neutro Abs: 3.1 K/uL (ref 1.7–7.7)
Neutrophils Relative %: 56 %
Platelet Count: 207 K/uL (ref 150–400)
RBC: 3.57 MIL/uL — ABNORMAL LOW (ref 3.87–5.11)
RDW: 13 % (ref 11.5–15.5)
WBC Count: 5.4 K/uL (ref 4.0–10.5)
nRBC: 0 % (ref 0.0–0.2)

## 2023-11-14 LAB — IRON AND IRON BINDING CAPACITY (CC-WL,HP ONLY)
Iron: 56 ug/dL (ref 28–170)
Saturation Ratios: 16 % (ref 10.4–31.8)
TIBC: 347 ug/dL (ref 250–450)
UIBC: 291 ug/dL

## 2023-11-14 LAB — FERRITIN: Ferritin: 72 ng/mL (ref 11–307)

## 2023-11-14 MED ORDER — ALTEPLASE 2 MG IJ SOLR
2.0000 mg | Freq: Once | INTRAMUSCULAR | Status: AC
  Administered 2023-11-14: 2 mg
  Filled 2023-11-14: qty 2

## 2023-11-14 NOTE — Patient Instructions (Signed)

## 2023-11-14 NOTE — Progress Notes (Signed)
 Unable to aspirate alteplase from port. Patient states her chest burns and her neck swells when saline is instilled into port. Per Dr Timmy, have IR place new port. Patient notified that IR will be calling to set up appt for new port placement. Patient verbalized understanding.

## 2023-11-14 NOTE — Progress Notes (Signed)
 Hematology and Oncology Follow Up Visit  Karen [name] [id number] 05-22-1970 53 y.o. 11/14/2023   Principle Diagnosis:  Hemochromatosis, heterozygous H63D mutation  Current Therapy:   Observation   Interim History:  Karen [name] is here today for follow-up.  She now has problems with her neck.  She had an MRI of the neck which apparently showed some spinal stenosis.  Soundly I suspect she is pregnant need to have surgery.  She also is going to have an MRI of the lower back.  We will have to see what this shows.  Again, I have believe this has nothing to do with her having hemochromatosis.  She is only has a minor mutation.  She really does not have a lot of iron overload.  When we last saw her, her ferritin was 153 with an iron saturation of 29%.  She also is dealing with rheumatoid arthritis.  I know that she sees a Rheumatologist.  She is having problems with her Port-A-Cath.  I am not sure exactly what the issue is.  And we will have to get her back to IR to see if they can either put a new one in or try to fix what she has already.  Currently, I would have to say that her performance status is probably ECOG 1.    Medications:  Allergies as of 11/14/2023       Reactions   Chlorhexidine  Rash   Molds & Smuts Other (See Comments), Cough   sneezing   Methotrexate  Nausea And Vomiting        Medication List        Accurate as of November 14, 2023  2:47 PM. If you have any questions, ask your nurse or doctor.          Acetaminophen  500 MG capsule 1 capsule as needed Orally every 6 hrs   albuterol  108 (90 Base) MCG/ACT inhaler Commonly known as: VENTOLIN  HFA Inhale 2 puffs into the lungs every 6 (six) hours as needed for wheezing or shortness of breath.   Elderberry 575 MG/5ML Syrp Take 30 mLs by mouth daily.   fluticasone  50 MCG/ACT nasal spray Commonly known as: FLONASE  Place 2 sprays into both nostrils daily.   Humira  (2 Pen) 40 MG/0.4ML pen Generic  drug: adalimumab  0.4 mL Subcutaneous EVERY 2 WEEKS for 30 days   Humira  (2 Pen) 40 MG/0.4ML pen Generic drug: adalimumab  Inject 0.4 mLs (40 mg total) into the skin every 14 (fourteen) days.   lansoprazole  30 MG capsule Commonly known as: PREVACID  Take 1 capsule (30 mg total) by mouth daily.   levocetirizine 5 MG tablet Commonly known as: XYZAL  Take 1 tablet (5 mg total) by mouth every evening.   lidocaine -prilocaine  cream Commonly known as: EMLA  Apply 1 Application topically as needed.   methocarbamol  500 MG tablet Commonly known as: ROBAXIN  Take 1 tablet (500 mg total) by mouth 3 (three) times daily.   ondansetron  4 MG disintegrating tablet Commonly known as: ZOFRAN -ODT Dissolve 1 tablet (4 mg total) in mouth every 8 (eight) hours as needed for nausea   ondansetron  4 MG disintegrating tablet Commonly known as: ZOFRAN -ODT Take 1 tablet (4 mg total) by mouth every 8 (eight) hours as needed for nausea.   polyethylene glycol 17 g packet Commonly known as: MIRALAX  / GLYCOLAX  Take 17 g by mouth every other day.   predniSONE  5 MG (21) Tbpk tablet Commonly known as: STERAPRED UNI-PAK 21 TAB Take by mouth as directed on package instructions for 6  days. Take with food. What changed: Another medication with the same name was removed. Continue taking this medication, and follow the directions you see here. Changed by: Karen [name]   Restora RX 60-1.25 MG Caps Generic drug: Lactobacillus Casei-Folic Acid  Take 1 capsule by mouth every morning.   rizatriptan  5 MG tablet Commonly known as: MAXALT  Take 1 tablet (5 mg total) by mouth as needed for migraine. May repeat in 2 hours if needed, no more than 2 pills/24 hours, no more than 3 pills/week.   triamcinolone  ointment 0.1 % Commonly known as: KENALOG  Apply pea sized amount to affected vulvar and perianal area three times per week. Increase to twice daily for flares.   UNABLE TO FIND Med Name: NIFEDIPINE 0.3%/LIDO1.5% OINT    Xiidra  5 % Soln Generic drug: Lifitegrast  Place 1 drop into both eyes 2 (two) times daily.        Allergies:  Allergies  Allergen Reactions   Chlorhexidine  Rash   Molds & Smuts Other (See Comments) and Cough    sneezing   Methotrexate  Nausea And Vomiting    Past Medical History, Surgical history, Social history, and Family History were reviewed and updated.  Review of Systems: Review of Systems  Constitutional:  Positive for malaise/fatigue.  HENT: Negative.    Eyes: Negative.   Respiratory: Negative.    Cardiovascular: Negative.   Gastrointestinal:  Positive for abdominal pain, heartburn and nausea.  Genitourinary: Negative.   Musculoskeletal: Negative.   Skin: Negative.   Neurological: Negative.   Endo/Heme/Allergies: Negative.   Psychiatric/Behavioral: Negative.       Physical Exam:  height is 5' 7 (1.702 m) and weight is 220 lb 12.8 oz (100.2 kg). Her oral temperature is 98.1 F (36.7 C). Her blood pressure is 117/79 and her pulse is 86. Her respiration is 18 and oxygen saturation is 99%.   Wt Readings from Last 3 Encounters:  11/14/23 220 lb 12.8 oz (100.2 kg)  10/12/23 214 lb (97.1 kg)  10/05/23 216 lb 9.6 oz (98.2 kg)    Physical Exam Vitals reviewed.  HENT:     Head: Normocephalic and atraumatic.  Eyes:     Pupils: Pupils are equal, round, and reactive to light.  Cardiovascular:     Rate and Rhythm: Normal rate and regular rhythm.     Heart sounds: Normal heart sounds.  Pulmonary:     Effort: Pulmonary effort is normal.     Breath sounds: Normal breath sounds.  Abdominal:     General: Bowel sounds are normal.     Palpations: Abdomen is soft.     Comments: Abdominal exam is soft.  She has some tenderness throughout the abdomen to palpation.  She has decreased bowel sounds.  She has no obvious fluid wave.  There is no palpable liver or spleen tip.  Musculoskeletal:        General: No tenderness or deformity. Normal range of motion.     Cervical  back: Normal range of motion.  Lymphadenopathy:     Cervical: No cervical adenopathy.  Skin:    General: Skin is warm and dry.     Findings: No erythema or rash.  Neurological:     Mental Status: She is alert and oriented to person, place, and time.  Psychiatric:        Behavior: Behavior normal.        Thought Content: Thought content normal.        Judgment: Judgment normal.      Lab  Results  Component Value Date   WBC 5.4 11/14/2023   HGB 11.6 (L) 11/14/2023   HCT 35.1 (L) 11/14/2023   MCV 98.3 11/14/2023   PLT 207 11/14/2023   Lab Results  Component Value Date   FERRITIN 153 10/04/2023   IRON 97 10/04/2023   TIBC 332 10/04/2023   UIBC 235 10/04/2023   IRONPCTSAT 29 10/04/2023   Lab Results  Component Value Date   RETICCTPCT 2.1 09/01/2023   RBC 3.57 (L) 11/14/2023   No results found for: JONATHAN BONG [facility] Lab Results  Component Value Date   IGGSERUM 1,438 12/08/2022   IGMSERUM 167 12/08/2022   No results found for: STEPHANY CARLOTA BENSON MARKEL EARLA JOANNIE DOC VICK, SPEI   Chemistry      Component Value Date/Time   NA 141 11/14/2023 1215   NA 140 03/21/2019 1158   K 3.9 11/14/2023 1215   CL 106 11/14/2023 1215   CO2 24 11/14/2023 1215   BUN 11 11/14/2023 1215   BUN 9 03/21/2019 1158   CREATININE 0.69 11/14/2023 1215      Component Value Date/Time   CALCIUM 9.4 11/14/2023 1215   ALKPHOS 124 11/14/2023 1215   AST 20 11/14/2023 1215   ALT 17 11/14/2023 1215   BILITOT 0.5 11/14/2023 1215       Impression and Plan: Ms. Mello is very pleasant 53 yo African American female with hemochromatosis, heterozygous for the H63D mutation.   If she does need to have any kind of surgery for her neck, I do not see a problem with this.  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  It would be interesting to see what an MRI of the lumbar spine shows.  Again, we will see if IR  can help with the Port-A-Cath.  I know this is causing her problems.  We will plan to get her back to see us  probably in about 2 months or so.  Again, if she needs any kind of orthopedic procedure, I really do not see a problem with this from my point of view.   Karen JONELLE Crease, MD 11/11/20252:47 PM

## 2023-11-14 NOTE — Progress Notes (Signed)
 Cath-Flo, 2.1 mls  instilled per policy as Per  S.Carter, NP order . Site marked.

## 2023-11-15 ENCOUNTER — Ambulatory Visit: Payer: Self-pay | Admitting: Hematology & Oncology

## 2023-11-15 ENCOUNTER — Ambulatory Visit (HOSPITAL_BASED_OUTPATIENT_CLINIC_OR_DEPARTMENT_OTHER): Payer: Self-pay | Admitting: Obstetrics & Gynecology

## 2023-11-15 ENCOUNTER — Encounter: Payer: Self-pay | Admitting: Physical Therapy

## 2023-11-15 ENCOUNTER — Other Ambulatory Visit: Payer: Self-pay

## 2023-11-15 ENCOUNTER — Ambulatory Visit: Admitting: Physical Therapy

## 2023-11-15 DIAGNOSIS — Z95828 Presence of other vascular implants and grafts: Secondary | ICD-10-CM

## 2023-11-15 DIAGNOSIS — M62838 Other muscle spasm: Secondary | ICD-10-CM

## 2023-11-15 DIAGNOSIS — M542 Cervicalgia: Secondary | ICD-10-CM

## 2023-11-15 DIAGNOSIS — M6281 Muscle weakness (generalized): Secondary | ICD-10-CM

## 2023-11-15 NOTE — Therapy (Signed)
 OUTPATIENT PHYSICAL THERAPY CERVICAL TREATMENT   Patient Name: Karen [name] MRN: [id number] DOB:July 09, 1970, 53 y.o., female Today's Date: 11/15/2023  END OF SESSION:  PT End of Session - 11/15/23 1236     Visit Number 17    Date for Recertification  12/26/23    Authorization Type [name]    PT Start Time 1236    PT Stop Time 1315    PT Time Calculation (min) 39 min                   Past Medical History:  Diagnosis Date   Adenomyosis    Anemia    Arthritis 2013   L knee gets steroid injections    Arthritis, rheumatic, acute or subacute    Asthmatic bronchitis 01/31/2017   Back pain    Chronic constipation    Diarrhea    DVT of lower extremity (deep venous thrombosis) (HCC)    Dysmenorrhea    Endometriosis    Esophageal stricture    G6PD deficiency    GERD (gastroesophageal reflux disease)    History of kidney stones    History of uterine fibroid    IBS (irritable bowel syndrome)    Interstitial cystitis    Lactose intolerance    Migraine    with aura   Other hemochromatosis 06/10/2021   PONV (postoperative nausea and vomiting)    Recurrent upper respiratory infection (URI)    Sebaceous cyst of breast, right lower inner quadrant 10/25/2012   Excised 11/21/12    Sleep apnea    Syncope, non cardiac    Past Surgical History:  Procedure Laterality Date   ARTHROSCOPIC HAGLUNDS REPAIR     BREAST CYST EXCISION Right 11/21/2012   Procedure: CYST EXCISION BREAST;  Surgeon: Sherlean JINNY Laughter, MD;  Location: [facility];  Service: General;  Laterality: Right;   BREAST CYST EXCISION Bilateral 01/18/2022   Procedure: EXCISION OF SEBACEOUS CYST BILATERAL BREAST;  Surgeon: Vanderbilt Ned, MD;  Location: [facility];  Service: General;  Laterality: Bilateral;   BREAST EXCISIONAL BIOPSY Right 11/2012   CESAREAN SECTION  04/19/1993   CHOLECYSTECTOMY     COLONOSCOPY  05/02/2011   Procedure: COLONOSCOPY;  Surgeon: Lynwood LITTIE Celestia Mickey., MD;  Location: [facility];  Service: Endoscopy;  Laterality: N/A;   colonoscopy  11/04/2014   DENTAL SURGERY  03/06/2018   2 surgeries ( 03/06/2018 and 04/06/2018)   to remove two separate benign tumors   ESOPHAGEAL MANOMETRY N/A 11/04/2015   Procedure: ESOPHAGEAL MANOMETRY (EM);  Surgeon: Lynwood Celestia, MD;  Location: [facility];  Service: Endoscopy;  Laterality: N/A;   ESOPHAGOGASTRODUODENOSCOPY  05/02/2011   Procedure: ESOPHAGOGASTRODUODENOSCOPY (EGD);  Surgeon: Lynwood LITTIE Celestia Mickey., MD;  Location: [facility];  Service: Endoscopy;  Laterality: N/A;   ESOPHAGOGASTRODUODENOSCOPY N/A 09/01/2014   Procedure: ESOPHAGOGASTRODUODENOSCOPY (EGD);  Surgeon: Lynwood Celestia, MD;  Location: [facility];  Service: Endoscopy;  Laterality: N/A;   IR CV LINE INJECTION  09/13/2023   IR IMAGING GUIDED PORT INSERTION  07/06/2023   KNEE SURGERY     LAPAROSCOPY     x 4   LESION REMOVAL N/A 01/18/2022   Procedure: EXCISION OF SEBACEOUS CYSTS CHEST AND NECK;  Surgeon: Vanderbilt Ned, MD;  Location: [facility];  Service: General;  Laterality: N/A;   PH IMPEDANCE STUDY N/A 11/04/2015   Procedure: PH IMPEDANCE STUDY;  Surgeon: Lynwood Celestia, MD;  Location: [facility];  Service: Endoscopy;  Laterality: N/A;   VAGINAL  HYSTERECTOMY  2010   TVH--ovaries remain   Patient Active Problem List   Diagnosis Date Noted   Sjogren syndrome with keratoconjunctivitis 12/09/2022   Other hemochromatosis 06/10/2021   Elevated LFTs 04/04/2021   Colitis 04/04/2021   Bacterial overgrowth syndrome 05/21/2020   Obesity 05/21/2020   History of endometriosis 05/21/2020   Constipation due to outlet dysfunction 02/05/2020   Gastroesophageal reflux disease 02/05/2020   Obstructive sleep apnea treated with continuous positive airway pressure (CPAP) 06/16/2017   Perennial allergic rhinitis with a nonallergic component 01/31/2017   Asthmatic bronchitis 01/31/2017   GI symptoms 01/31/2017   Family history  of colon cancer 01/27/2015   Chest pain 12/13/2012   Dyspnea 12/13/2012   DVT of lower extremity (deep venous thrombosis) (HCC) 01/03/1990    PCP: Elliot Charm, MD   REFERRING PROVIDER: Huey Redell Fallow, PA-C  REFERRING DIAG: M54.2 (ICD-10-CM) - Cervicalgia  THERAPY DIAG:  Muscle weakness (generalized)  Other muscle spasm  Other low back pain  Cervicalgia  Rationale for Evaluation and Treatment: Rehabilitation  ONSET DATE: July 2025  SUBJECTIVE:                                                                                                                                                                                                         SUBJECTIVE STATEMENT: Going to have to take the port out and replace it.  Discussed new referral for aquatics lumbar spine, will wait until after new port placement.  Anemic.  Will have MRI of low back next week.  Overhead weights may have aggravated my hip.      Eval: Reports onset of L UT pain following port placement into R shoulder Hand dominance: Right  PERTINENT HISTORY:  07/21/2023 Assessment: - suspect left-sided cervical/ trapezius muscle spasm - reassured by absence of red flag signs/ symptoms at this time   Concurrent lymphedema PAIN:  Are you having pain? Yes: NPRS scale: 4/10 Pain location: L UT Pain description: ache, cramp Aggravating factors: activity Relieving factors: heat, medication  PRECAUTIONS: None  RED FLAGS: None     WEIGHT BEARING RESTRICTIONS: No  FALLS:  Has patient fallen in last 6 months? No  OCCUPATION: [employer] Employee  PLOF: Independent  PATIENT GOALS: To manage my neck pain  NEXT MD VISIT: TBD  OBJECTIVE:  Note: Objective measures were completed at Evaluation unless otherwise noted.  DIAGNOSTIC FINDINGS:  Oct 2025 C3-4 mod bulging disc osteophyte C4-5 mod to severe bulging disc osteophyte C5-6 severe bulging osteophyte C6-7 minimal retrolisthesis of c6  and C7 with severe bulging  disc T2-48mod left disc protrusion  subarticular  PATIENT SURVEYS:  NDI: 28/50 56% perceived disability  Minimum Detectable Change (90% confidence): 5 points or 10% points  10/03/23:  NDI  30%   The Patient-Specific Functional Scale  Initial:  I am going to ask you to identify up to 3 important activities that you are unable to do or are having difficulty with as a result of this problem.  Today are there any activities that you are unable to do or having difficulty with because of this?  (Patient shown scale and patient rated each activity)  Follow up: When you first came in you had difficulty performing these activities.  Today do you still have difficulty?  Patient-Specific activity scoring scheme (Point to one number):  0 1 2 3 4 5 6 7 8 9  10 Unable                                                                                                          Able to perform To perform                                                                                                    activity at the same Activity         Level as before                                                                                                                       Injury or problem  Activity       Computer work 1 hour                                     9/30: 5                     11/12  5  2.          Cooking  9/30: 5                                                                                         3.         Sit and watch TV                                              9/30: 5                                                                         POSTURE: rounded shoulders and forward head  PALPATION: Global tenderness throughout B UT and posterior shoulder girdle   CERVICAL ROM:   Active ROM A/PROM (deg) eval 9/30 11/12  Flexion 75%P! 47 50  Extension 75%P! 45 46  Right lateral flexion 75%P! 36 43   Left lateral flexion 75%P! 29 31  Right rotation 75%P! 35 40  Left rotation 75%P! 29 22   (Blank rows = not tested)  UPPER EXTREMITY ROM:  Active ROM Right eval Left eval 9/30 11/12  Shoulder flexion 150 150 L 155  R 151   Shoulder extension      Shoulder abduction 150 150 L 167 R 161 R 150 L 155  Shoulder adduction      Shoulder extension      Shoulder internal rotation      Shoulder external rotation      Elbow flexion      Elbow extension      Wrist flexion      Wrist extension      Wrist ulnar deviation      Wrist radial deviation      Wrist pronation      Wrist supination       (Blank rows = not tested)  UPPER EXTREMITY MMT:  MMT Right eval Left eval 9/30 11/12  Shoulder flexion      Shoulder extension      Shoulder abduction      Shoulder adduction      Shoulder extension      Shoulder internal rotation      Shoulder external rotation      Middle trapezius   Bil 4-/5 Bil 4/5  Lower trapezius   Bil 4-/5 Bil 4/5  Elbow flexion      Elbow extension      Wrist flexion      Wrist extension      Wrist ulnar deviation      Wrist radial deviation      Wrist pronation      Wrist supination      Grip strength       (Blank rows = not tested)  CERVICAL SPECIAL TESTS:  Neck flexor muscle endurance test: Negative 9/30  decreased cervical extensor strength 4-/5 11/12: deep cervical flexors and exensors 4/5  FUNCTIONAL TESTS:  N/A  TREATMENT  11/15/2023 Discussion of aquatic PT after new port placement and when cleared to start Discussed benefit of strengthening cervical deep cervical flexors for stabilization Seated deep cervical nods Standing deep cervical nods on wall Standing deep cervical nods on wall with shoulder press against wall 10x, bil UE elevation 5x; green loop around wrists steering wheel turns Facing wall: red band behind head, hands holding band to wall with isometric head press 5 sec hold 10x Cervical ROM as above Shoulder ROM as  above Manual:  grade 2 cervical distraction 3x 10; gentle upper trap lengthening right/left; suboccipital release; rotation mobs C2-4, lateral side glides C4-7 right/left grade 2/3   11/08/2023 Update on status and MRI results Hooklying chin tuck x 12 Hooklying scap retraction x 12 Hookyling chin tuck + elbow press down x 12 Hooklying chin tuck + shoulder press down x 12 Decompression series: whole leg lengthening, whole leg press down 5x 5 sec holds Manual: cervical occipital release x 10 5 sec holds and STM to bilateral upper traps    10/31/2023 Discussion of her visit with Dr. Duwayne regarding back pain and his referral to [facility] aquatic [facility] Discussed benefit of strengthening cervical deep cervical flexors for stabilization Supine deep cervical activation with: UE elevation alternating, elbow press down, shoulder press down 5x (Added to HEP- pt instructions)  Decompression series: whole leg lengthening, whole leg press down 5x 5 sec holds (Added to HEP- pt instructions) Manual:  grade 2 cervical distraction 3x 10; gentle upper trap lengthening right/left; suboccipital release Saunders cervical traction 8# static 3 min  10/25/2023 Discussed results of recent cervical MRI Postural alignment and how that affects load on cervical spine Discussed benefit of strengthening cervical deep cervical flexors for stabilization Standing green band with handles: bil shoulder extension with cue for deep cervical flexion activation Supine deep cervical nods 5-10 sec holds (Added to HEP- see below) Seated deep cervical activation (Added to HEP- see below) Manual: seated soft tissue mobilization to right > left cervical paraspinals and suboccipitals; grade 2 cervical distraction 3x 10; avoided right upper trap soft tissue mob secondary to blood clot in right port          10/17/2023 Manual: seated soft tissue mobilization to right > left cervical paraspinals and suboccipitals; grade 2 cervical  distraction 3x 10; avoided right upper trap soft tissue mob secondary to blood clot in right port Attempted thoracic extension exercises against small ball put patient had increased abdominal pain that radiated into her back Seated TA activation + ball squeeze x 20  Seated scap squeeze with light band around wrist 2 x 8  Seated shoulder abduction against light band + shoulder flexion 2 x 8 Chin tuck 2 x 10 Seated shoulder ER with light loop 2 x 8  Standing QL stretch x 8 each side Attempted foam roll roll up at [facility] but this was painful for patient's side Foam roll along patient's spine at wall + snow angel x 8 Seated scalene  stretch 2 x 20sec bilateral     10/11/2023 Update on status and appointment from neck specialist Manual: seated soft tissue mobilization to right > left cervical paraspinals and suboccipitals; grade 2 cervical distraction 3x 10; avoided right upper trap soft tissue mob secondary to blood clot in right port Seated thoracic extension against small blue ball (minimal cervical movement ) x 10 then thoracic rotation x 10 each  side Seated shoulder abduction against blue loop x 10  Seated shoulder abduction + scap squeeze with blue loop around wrist 2 x 8 Seated thoracic rotation sitting on plinth x 4 each direction                                                                                                         PATIENT EDUCATION:  Education details: Discussed eval findings, rehab rationale and POC and patient is in agreement  Person educated: Patient Education method: Explanation and Handouts Education comprehension: verbalized understanding and needs further education  HOME EXERCISE PROGRAM: Access Code: [email or web address] URL: [email or web address] Date: 10/25/2023 Prepared by: Glade Pesa  Exercises - Seated Shoulder Shrugs  - 2 x daily - 5 x weekly - 2 sets - 10 reps - Seated Scapular Retraction  - 2 x daily - 5 x weekly - 2 sets - 10 reps -  Gentle Levator Scapulae Stretch  - 1 x daily - 7 x weekly - 1 sets - 2 reps - 30 seconds hold - Seated Cervical Sidebending Stretch  - 1 x daily - 7 x weekly - 1 sets - 2 reps - 30 seconds hold - Quadricep Stretch with Chair and Counter Support  - 1 x daily - 7 x weekly - 2 sets - 20-30s hold - Standing Hamstring Stretch with Step  - 1 x daily - 7 x weekly - 2 sets - 20-30s hold - Seated Hamstring Stretch  - 1 x daily - 7 x weekly - 2 sets - 20-30 hold - Seated Hip Flexor Stretch  - 1 x daily - 7 x weekly - 2 sets - 20-30 hold - Seated Scalene Stretch with Towel  - 1 x daily - 7 x weekly - 2 sets - 20 reps - Seated Modified Small Range Crunch in Chair  - 1 x daily - 7 x weekly - 1-2 sets - 10 reps - Standing Marching  - 1 x daily - 7 x weekly - 1 sets - 10 reps - Median Nerve Flossing - Tray  - 1 x daily - 7 x weekly - 1 sets - 10 reps - Seated Cervical Traction  - 1 x daily - 7 x weekly - 1 sets - 10 reps - Seated Neck and Traction  - 1 x daily - 7 x weekly - 1 sets - 10 reps - Standing Low Shoulder Row with Anchored Resistance  - 1 x daily - 7 x weekly - 1 sets - 10 reps - Seated Thoracic Flexion and Rotation with Arms Crossed  - 1 x daily - 7 x weekly - 1 sets - 5 reps - Seated Thoracic Lumbar Extension  - 1 x daily - 7 x weekly - 1 sets - 5-10 reps - Supine Head Nod with Deep Neck Flexor Activation  - 1 x daily - 7 x weekly - 1 sets - 10 reps - Seated Deep Neck Flexor Nods  - 1 x daily - 7 x weekly - 1 sets - 10 reps ASSESSMENT:  CLINICAL IMPRESSION: Babita has a  new order from Dr. 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  We discussed holding on aquatics until she is cleared by her doctor following new port placement and will add lumbar diagnosis to plan of care/recertification. Improving right cervical rotation and sidebending but left is limited possible secondary to edema related to the port.  Improved activation of deep cervical flexors and able to  progress to performing these ex's in standing.      OBJECTIVE IMPAIRMENTS: decreased activity tolerance, decreased mobility, decreased ROM, increased fascial restrictions, increased muscle spasms, impaired UE functional use, postural dysfunction, and pain.   ACTIVITY LIMITATIONS: carrying, lifting, bending, bed mobility, and reach over head  PERSONAL FACTORS: Age, Fitness, Past/current experiences, and 1 comorbidity: lymphedema are also affecting patient's functional outcome.   REHAB POTENTIAL: Good  CLINICAL DECISION MAKING: Stable/uncomplicated  EVALUATION COMPLEXITY: Low   GOALS: Goals reviewed with patient? No    SHORT TERM GOALS=LONG TERM GOALS: Target date: 12/26/2023  Patient to demonstrate independence in HEP  Baseline: [email or web address] Goal status: ongoing  2.  Patient will acknowledge 4/10 pain at least once during episode of care   Baseline: 8/10 Goal status: ongoing  3.  Patient will score at least 20/50 on NDI to signify clinically meaningful improvement in functional abilities.   Baseline: 28/50 Goal status:  met 9/30  4.  160d AROM in B shoulder flexion/abduction Baseline: 150d Goal status: ongoing  5.  Improved cervical rotation ROM to 45 degrees needed for driving revised Goal status:   6. Be able to walk 1 hour or navigate hills at [facility] new  7. PSFS rating for computer work improved to 6-7 new  8. PSFS rating for cooking improved to 6 new   PLAN:  PT FREQUENCY: 1x/week  PT DURATION: 12 weeks   PLANNED INTERVENTIONS: 97110-Therapeutic exercises, 97530- Therapeutic activity, 97112- Neuromuscular re-education, 97535- Self Care, and [id number]- Manual therapy  PLAN FOR NEXT SESSION  check MRI results lumbar; deep cervical flexor strengthening;  postural strengthening; manual techniques as appropriate; pt states Dr. Duwayne gave her a referral for aquatic PT for lumbar spine but may need to wait until after new port placement   Glade Pesa,  PT 11/15/23 4:39 PM Phone: [phone] Fax: [phone]

## 2023-11-16 ENCOUNTER — Encounter (HOSPITAL_COMMUNITY): Payer: Self-pay

## 2023-11-16 ENCOUNTER — Ambulatory Visit: Payer: Self-pay

## 2023-11-16 DIAGNOSIS — R102 Pelvic and perineal pain unspecified side: Secondary | ICD-10-CM

## 2023-11-16 DIAGNOSIS — M542 Cervicalgia: Secondary | ICD-10-CM | POA: Diagnosis not present

## 2023-11-16 DIAGNOSIS — M62838 Other muscle spasm: Secondary | ICD-10-CM

## 2023-11-16 DIAGNOSIS — M5459 Other low back pain: Secondary | ICD-10-CM

## 2023-11-16 DIAGNOSIS — M6281 Muscle weakness (generalized): Secondary | ICD-10-CM

## 2023-11-16 DIAGNOSIS — R279 Unspecified lack of coordination: Secondary | ICD-10-CM

## 2023-11-16 DIAGNOSIS — M25641 Stiffness of right hand, not elsewhere classified: Secondary | ICD-10-CM | POA: Diagnosis not present

## 2023-11-16 NOTE — Therapy (Signed)
 OUTPATIENT PHYSICAL THERAPY FEMALE PELVIC TREATMENT     Patient Name: Karen [name] MRN: [id number] DOB:08-05-1970, 53 y.o., female Today's Date: 11/16/2023  END OF SESSION:  PT End of Session - 11/16/23 1102     Visit Number 56    Date for Recertification  12/26/23    Authorization Type [name]    PT Start Time 1101    PT Stop Time 1141    PT Time Calculation (min) 40 min    Activity Tolerance Patient tolerated treatment well    Behavior During Therapy [facility] for tasks assessed/performed                      Past Medical History:  Diagnosis Date   Adenomyosis    Anemia    Arthritis 2013   L knee gets steroid injections    Arthritis, rheumatic, acute or subacute    Asthmatic bronchitis 01/31/2017   Back pain    Chronic constipation    Diarrhea    DVT of lower extremity (deep venous thrombosis) (HCC)    Dysmenorrhea    Endometriosis    Esophageal stricture    G6PD deficiency    GERD (gastroesophageal reflux disease)    History of kidney stones    History of uterine fibroid    IBS (irritable bowel syndrome)    Interstitial cystitis    Lactose intolerance    Migraine    with aura   Other hemochromatosis 06/10/2021   PONV (postoperative nausea and vomiting)    Recurrent upper respiratory infection (URI)    Sebaceous cyst of breast, right lower inner quadrant 10/25/2012   Excised 11/21/12    Sleep apnea    Syncope, non cardiac    Past Surgical History:  Procedure Laterality Date   ARTHROSCOPIC HAGLUNDS REPAIR     BREAST CYST EXCISION Right 11/21/2012   Procedure: CYST EXCISION BREAST;  Surgeon: Sherlean JINNY Laughter, MD;  Location: [facility];  Service: General;  Laterality: Right;   BREAST CYST EXCISION Bilateral 01/18/2022   Procedure: EXCISION OF SEBACEOUS CYST BILATERAL BREAST;  Surgeon: Vanderbilt Ned, MD;  Location: [facility];  Service: General;  Laterality: Bilateral;   BREAST EXCISIONAL BIOPSY  Right 11/2012   CESAREAN SECTION  04/19/1993   CHOLECYSTECTOMY     COLONOSCOPY  05/02/2011   Procedure: COLONOSCOPY;  Surgeon: Lynwood LITTIE Celestia Mickey., MD;  Location: [facility];  Service: Endoscopy;  Laterality: N/A;   colonoscopy  11/04/2014   DENTAL SURGERY  03/06/2018   2 surgeries ( 03/06/2018 and 04/06/2018)   to remove two separate benign tumors   ESOPHAGEAL MANOMETRY N/A 11/04/2015   Procedure: ESOPHAGEAL MANOMETRY (EM);  Surgeon: Lynwood Celestia, MD;  Location: [facility];  Service: Endoscopy;  Laterality: N/A;   ESOPHAGOGASTRODUODENOSCOPY  05/02/2011   Procedure: ESOPHAGOGASTRODUODENOSCOPY (EGD);  Surgeon: Lynwood LITTIE Celestia Mickey., MD;  Location: [facility];  Service: Endoscopy;  Laterality: N/A;   ESOPHAGOGASTRODUODENOSCOPY N/A 09/01/2014   Procedure: ESOPHAGOGASTRODUODENOSCOPY (EGD);  Surgeon: Lynwood Celestia, MD;  Location: [facility];  Service: Endoscopy;  Laterality: N/A;   IR CV LINE INJECTION  09/13/2023   IR IMAGING GUIDED PORT INSERTION  07/06/2023   KNEE SURGERY     LAPAROSCOPY     x 4   LESION REMOVAL N/A 01/18/2022   Procedure: EXCISION OF SEBACEOUS CYSTS CHEST AND NECK;  Surgeon: Vanderbilt Ned, MD;  Location: [facility];  Service: General;  Laterality: N/A;   PH IMPEDANCE STUDY N/A  11/04/2015   Procedure: PH IMPEDANCE STUDY;  Surgeon: Lynwood Bohr, MD;  Location: [facility];  Service: Endoscopy;  Laterality: N/A;   VAGINAL HYSTERECTOMY  2010   TVH--ovaries remain   Patient Active Problem List   Diagnosis Date Noted   Sjogren syndrome with keratoconjunctivitis 12/09/2022   Other hemochromatosis 06/10/2021   Elevated LFTs 04/04/2021   Colitis 04/04/2021   Bacterial overgrowth syndrome 05/21/2020   Obesity 05/21/2020   History of endometriosis 05/21/2020   Constipation due to outlet dysfunction 02/05/2020   Gastroesophageal reflux disease 02/05/2020   Obstructive sleep apnea treated with continuous positive airway pressure (CPAP) 06/16/2017   Perennial  allergic rhinitis with a nonallergic component 01/31/2017   Asthmatic bronchitis 01/31/2017   GI symptoms 01/31/2017   Family history of colon cancer 01/27/2015   Chest pain 12/13/2012   Dyspnea 12/13/2012   DVT of lower extremity (deep venous thrombosis) (HCC) 01/03/1990    PCP: Elliot Charm, MD  REFERRING PROVIDER: Cleotilde Ronal RAMAN, MD   REFERRING DIAG: M62.89 (ICD-10-CM) - Pelvic floor dysfunction  THERAPY DIAG:  Muscle weakness (generalized)  Other muscle spasm  Other low back pain  Pelvic pain  Unspecified lack of coordination  Rationale for Evaluation and Treatment: Rehabilitation  ONSET DATE: chronic  SUBJECTIVE:                                                                                                                                                                                           SUBJECTIVE STATEMENT:  Pt saw ortho last Friday and they think she has pinched nerves in low back that are giving her hip pain. She saw hematologist and she is actually anemic right now. She is waiting to have her port taken out  PAIN: 11/09/23 Are you having pain? Yes NPRS scale: 4/10 Pain location: Lower abdominal pain, Lt sided pain, low back pain  Pain type: turning, knotted, pressure Pain description: intermittent and constant   Aggravating factors: constipation or frequent bowel movements/constantly having bowel movements  Relieving factors: exercises (bowel massage, happy baby, spinal twists, walking)  PRECAUTIONS: None  WEIGHT BEARING RESTRICTIONS: No  FALLS:  Has patient fallen in last 6 months? No  LIVING ENVIRONMENT: Lives with: lives with their spouse Lives in: House/apartment  OCCUPATION: nurse, in admin  PLOF: Independent  PATIENT GOALS: decrease pain and have more normal bowel movements  PERTINENT HISTORY:  Vaginal hysterectomy, DVT LE, endometriosis, interstitial cystitis, exploratory laps for endometriosis, c-section,  cholecystectomy, RA diagnosed 06/2023 Sexual abuse: No  BOWEL MOVEMENT: Pain with bowel movement: Yes Type of bowel movement:Type (Bristol Stool Scale) 1-7 (sometimes full range in the same bowel movement),  Frequency sometimes many times a day, sometimes up to 4 days in between, and Strain Yes Fully empty rectum: No Leakage: No Pads: No Fiber supplement: No - has attempted metamucil and it made constipation worse  URINATION: Pain with urination: Yes Fully empty bladder: No Stream: varies  Urgency: Yes: all the time Frequency: multiple times an hours  Leakage: Urge to void, Walking to the bathroom, Coughing, Sneezing, Laughing, and Exercise Pads: Yes: daily  INTERCOURSE: Pain with intercourse: Initial Penetration and During Penetration Ability to have vaginal penetration:  Yes: with pain Climax: non painful WNL   PREGNANCY: Vaginal deliveries 1 Tearing Yes: 3rd degree tear C-section deliveries 1 Currently pregnant No  PROLAPSE: Periodically will feel vaginal bulging, heaviness in lower abdomen   OBJECTIVE:  10/10/23 Curl-up test: Minimal abdominal distortion focused in upper abdominals, largely improved Transversus abdominus: good core facilitation with exhale Sit-up test: 1/3  LUMBARAROM/PROM:  A/PROM A/PROM  Eval (% available) 11/09/22 (% available) 04/25/23 (% available) 10/10/23 (% available)  Flexion 50 90 90 100  Extension 25, pain anteriorly  25, pain in low back 50 75  Right lateral flexion 50, pain anteriorly  75, pain on right 50, pain on Rt 75  Left lateral flexion 50, pain anteriorly  75, pain on right 75 75   (Blank rows = not tested)    05/04/23:               Internal Pelvic Floor: mild tenderness in bil levator ani, but no increase in tension; with internal rectal exam, she was demonstrating paradoxical contraction when trying to eccentrically lengthen with exhale and had very hard time correcting  Patient confirms identification and approves PT to  assess internal pelvic floor and treatment Yes  PELVIC MMT:   MMT eval  Vaginal 2/5, 3 second hold, 3 repeat contractions  Internal Anal Sphincter 1/5  External Anal Sphincter 2/5  Puborectalis 1/5  Diastasis Recti   (Blank rows = not tested)        TONE: WNL   PROLAPSE: Grade 2 anterior vaginal wall laxity  04/25/23: Curl-up test: abdominal distortion Transversus abdominus: week, difficulty getting active contraction (not been working on strengthening)    LUMBARAROM/PROM:  A/PROM A/PROM  Eval (% available) 11/09/22 (% available) 04/25/23 (% available)  Flexion 50 90 90  Extension 25, pain anteriorly  25, pain in low back 50  Right lateral flexion 50, pain anteriorly  75, pain on right 50, pain on Rt  Left lateral flexion 50, pain anteriorly  75, pain on right 75   (Blank rows = not tested)  11/09/22: No internal rectal or vaginal pelvic floor exam performed due to high level of skin irritation   Weak transversus abdominus contraction  Abdominal restriction and tenderness in bil lower quadrant  Unable to perform pelvic tilt with appropriate coordination  LUMBARAROM/PROM:  A/PROM A/PROM  Eval (% available) 11/09/22 (% available)  Flexion 50 90  Extension 25, pain anteriorly  25, pain in low back  Right lateral flexion 50, pain anteriorly  75, pain on right  Left lateral flexion 50, pain anteriorly  75, pain on right   (Blank rows = not tested) 07/21/22: standing prolapse assessment demonstrated grade 2 anterior vaginal wall laxity   06/08/22:               External Perineal Exam: WNL  Internal Pelvic Floor: burning reported with palpation of superficial muscles; deep aching/pressure with palpation of Lt levator ani   Patient confirms identification and approves PT to assess internal pelvic floor and treatment Yes  PELVIC MMT:   MMT eval  Vaginal 3/5  Internal Anal Sphincter 1/5  External Anal Sphincter 2/5  Puborectalis 1/5   Diastasis Recti   (Blank rows = not tested)        TONE: High in Lt levator ani   PROLAPSE: Not able to tell this session due to dyssynergic pelvic floor contraction     06/01/22: COGNITION: Overall cognitive status: Within functional limits for tasks assessed     SENSATION: Light touch: Appears intact Proprioception: Appears intact  GAIT: Comments: decreased hip extension, forward flexed trunk  POSTURE: rounded shoulders, forward head, decreased lumbar lordosis, increased thoracic kyphosis, posterior pelvic tilt, and flexed trunk    LUMBARAROM/PROM:  A/PROM A/PROM  Eval (% available)  Flexion 50  Extension 25, pain anteriorly   Right lateral flexion 50, pain anteriorly   Left lateral flexion 50, pain anteriorly    (Blank rows = not tested)   PALPATION:   General  Significant abdominal restriction and tenderness; decreased rib mobility with mobilization and breathing    TODAY'S TREATMENT:                                                                                                                              DATE:  11/16/23 Manual: Supine: ILU bowel mobilization  Abdominal scar tissue mobilization Ileocecal valve mobilization External rectum mobilization Diaphragm release Exercises: Sideling clam shells + red band 2 x 10 bil Sidelying reverse clam shells 2 x 10 bil Open books 10x bil Resisted supine march + red band 2 x 10 Bridge + hip adduction 2 x 10 Bridge + march 2 x 10  11/09/23 Manual: Lt side lying Rt hip soft tissue mobilization  Supine: ILU bowel mobilization  Abdominal scar tissue mobilization Ileocecal valve mobilization External rectum mobilization Diaphragm release Exercises: Supine resisted march + red band 3 x 10 Supine resisted bil hip abduction + red band 2 x 10 Supine unilateral resisted bent knee fall out + red band 2 x 10 bil Bridge + hip adduction 2 x 10 Supine leg extension 10x bil Supine weight drop + 5lbs 2 x  10   11/01/23 Manual: Supine: ILU bowel mobilization  Abdominal scar tissue mobilization Ileocecal valve mobilization External rectum mobilization Diaphragm release Neuromuscular re-education: Bridge with hip adduction, transversus abdominus, and pelvic floor muscle 2 x 10 Supine unilateral bent knee fall out 2 x 10 bil Supine resisted march + green band 2 x 10 Exercises: Sidelying: Clam shells 2 x 10 bil Reverse clam shells 2 x 10 bil Hip abduction 2 x 10 bil Open books 10x bil    PATIENT EDUCATION:  Education details: See above Person educated: Patient Education method: Explanation, Demonstration, Tactile cues, Verbal cues, and Handouts Education comprehension: verbalized understanding  HOME EXERCISE PROGRAM: [id number]  ASSESSMENT:  CLINICAL IMPRESSION: Pt still having Rt hip and low back pain, but she feels like hip work helped improve tightness. She was able to work on strengthening today without increase in Rt hip pain. Continued manual techniques to help maintain improvements with constipation and reviewed to help give her guidance to perform at home. She will continue to benefit from skilled PT intervention in order to decrease pain, improve bowel movements, and promote independence with HEP.     OBJECTIVE IMPAIRMENTS: decreased activity tolerance, decreased coordination, decreased endurance, decreased mobility, decreased strength, increased fascial restrictions, increased muscle spasms, impaired tone, postural dysfunction, and pain.   ACTIVITY LIMITATIONS: lifting, bending, continence, and locomotion level  PARTICIPATION LIMITATIONS: interpersonal relationship, community activity, and occupation  PERSONAL FACTORS: 1 comorbidity: medical history are also affecting patient's functional outcome.   REHAB POTENTIAL: Good  CLINICAL DECISION MAKING: Stable/uncomplicated  EVALUATION COMPLEXITY: Low   GOALS: Goals reviewed with patient? Yes  SHORT TERM GOALS:  Updated 10/10/23  Pt will be independent with HEP.   Baseline: Goal status: MET 07/12/22  2.  Pt will be independent with diaphragmatic breathing and down training activities in order to improve pelvic floor relaxation.  Baseline:  Goal status: MET 07/12/22  3.  Pt will be independent with the knack, urge suppression technique, and double voiding in order to improve bladder habits and decrease urinary incontinence.   Baseline:  Goal status: MET 09/22/22  4.  Pt will be independent with use of squatty potty, relaxed toileting mechanics, and improved bowel movement techniques in order to increase ease of bowel movements and complete evacuation.   Baseline:  Goal status: MET 11/09/22  5.  Pt will be able to correctly perform diaphragmatic breathing and appropriate pressure management in order to prevent worsening vaginal wall laxity and improve pelvic floor A/ROM.   Baseline:  Goal status: MET 01/18/23  LONG TERM GOALS: Updated 10/10/23  Pt will be independent with advanced HEP.   Baseline:  Goal status: IN PROGRESS 11/16/23  2.  Pt will demonstrate normal pelvic floor muscle tone and A/ROM, able to achieve 4/5 strength with contractions and 10 sec endurance, in order to provide appropriate lumbopelvic support in functional activities.   Baseline: not assessed today per patient request - no current bladder issues and they have all resolved Goal status: IN PROGRESS 11/16/23  3.  Pt will increase all impaired lumbar A/ROM by 25% without pain.  Baseline: Pt seen a little bit of loss in some and improvement in extension Goal status:  IN PROGRESS 11/16/23  4.  Pt will report pain no higher than 3/10 with any activity. Baseline: pain currently 3/10, worst is an 8/10 and often corresponds to after she is eating Goal status:  IN PROGRESS 11/16/23  5.  Pt will report 0/10 pain with vaginal penetration in order to improve intimate relationship with partner.    Baseline: no recent  intercourse attempts  Goal status:  IN PROGRESS 11/16/23  6.  Pt will be able to go 2-3 hours in between voids without urgency or incontinence in order to improve QOL and perform all functional activities with less difficulty.   Baseline:  Goal status: MET 02/21/23  7.  Pt will report no leaks with laughing, coughing, sneezing in order to improve comfort with interpersonal relationships and community activities.   Baseline: Pt states that she feels 30-40% better Goal status:  MET 10/10/23  8.  Pt will have consistent bowel movements 4-5x/week without  straining or pain.  Baseline:  Goal status:  MET 02/21/23  PLAN:  PT FREQUENCY: 1-2x/week  PT DURATION: 6 months  PLANNED INTERVENTIONS: Therapeutic exercises, Therapeutic activity, Neuromuscular re-education, Balance training, Gait training, Patient/Family education, Self Care, Joint mobilization, Dry Needling, Biofeedback, and Manual therapy  PLAN FOR NEXT SESSION: progress strengthening program; prepare for discharge in the next 6-8 visits   Josette Mares, PT, DPT11/13/2511:21 AM

## 2023-11-16 NOTE — Progress Notes (Signed)
 RE: PORT REVISION/REPLACE? Received: Today Luverne Aran, MD  Tommie Riggs [place], can set up for new port.  GY       Previous Messages    ----- Message ----- From: Tommie Riggs Sent: 11/16/2023  10:30 AM EST To: Aran Luverne, MD Subject: FW: PORT REVISION/REPLACE?                    Yes they tried on 11/10 and still having issues, patient keeps saying it swells and hurts as well.  (Per RN) ----- Message ----- From: Luverne Aran, MD Sent: 11/16/2023  10:12 AM EST To: Riggs Tommie Subject: RE: PORT REVISION/REPLACE?                    Did they try tPA? ----- Message ----- From: Tommie Riggs Sent: 11/16/2023   9:56 AM EST To: Ir Procedure Requests Subject: PORT REVISION/REPLACE?                        PT HAD DYE STUDY 09/14/23: PLAN: Recommend tPA dwell after the next access session either for labs or phlebotomy. Findings discussed with the patient.  DR.ENNEVER HAS PLACED ORDER FOR US  TO REPLACE/REVISE, OK TO SCHEDULE?

## 2023-11-20 ENCOUNTER — Encounter: Payer: Self-pay | Admitting: Occupational Therapy

## 2023-11-20 ENCOUNTER — Ambulatory Visit: Admitting: Occupational Therapy

## 2023-11-20 DIAGNOSIS — I890 Lymphedema, not elsewhere classified: Secondary | ICD-10-CM | POA: Diagnosis not present

## 2023-11-20 NOTE — Therapy (Signed)
 OUTPATIENT OCCUPATIONAL THERAPY TREATMENT NOTE  BLE/ BLQ LYMPHEDEMA  Patient Name: Karen [name] MRN: [id number] DOB:17-Sep-1970, 53 y.o., female Today's Date: 11/20/2023  REPORTING PERIOD: 10/30/23 -   END OF SESSION:  OT End of Session - 11/20/23 1212     Visit Number 63    Number of Visits 72    Date for Recertification  11/16/23    OT Start Time 1105    OT Stop Time 1205    OT Time Calculation (min) 60 min    Activity Tolerance Patient tolerated treatment well;No increased pain    Behavior During Therapy [facility] for tasks assessed/performed           Past Medical History:  Diagnosis Date   Adenomyosis    Anemia    Arthritis 2013   L knee gets steroid injections    Arthritis, rheumatic, acute or subacute    Asthmatic bronchitis 01/31/2017   Back pain    Chronic constipation    Diarrhea    DVT of lower extremity (deep venous thrombosis) (HCC)    Dysmenorrhea    Endometriosis    Esophageal stricture    G6PD deficiency    GERD (gastroesophageal reflux disease)    History of kidney stones    History of uterine fibroid    IBS (irritable bowel syndrome)    Interstitial cystitis    Lactose intolerance    Migraine    with aura   Other hemochromatosis 06/10/2021   PONV (postoperative nausea and vomiting)    Recurrent upper respiratory infection (URI)    Sebaceous cyst of breast, right lower inner quadrant 10/25/2012   Excised 11/21/12    Sleep apnea    Syncope, non cardiac    Past Surgical History:  Procedure Laterality Date   ARTHROSCOPIC HAGLUNDS REPAIR     BREAST CYST EXCISION Right 11/21/2012   Procedure: CYST EXCISION BREAST;  Surgeon: Sherlean JINNY Laughter, MD;  Location: [facility];  Service: General;  Laterality: Right;   BREAST CYST EXCISION Bilateral 01/18/2022   Procedure: EXCISION OF SEBACEOUS CYST BILATERAL BREAST;  Surgeon: Vanderbilt Ned, MD;  Location: [facility];  Service: General;  Laterality: Bilateral;   BREAST  EXCISIONAL BIOPSY Right 11/2012   CESAREAN SECTION  04/19/1993   CHOLECYSTECTOMY     COLONOSCOPY  05/02/2011   Procedure: COLONOSCOPY;  Surgeon: Lynwood LITTIE Celestia Mickey., MD;  Location: [facility];  Service: Endoscopy;  Laterality: N/A;   colonoscopy  11/04/2014   DENTAL SURGERY  03/06/2018   2 surgeries ( 03/06/2018 and 04/06/2018)   to remove two separate benign tumors   ESOPHAGEAL MANOMETRY N/A 11/04/2015   Procedure: ESOPHAGEAL MANOMETRY (EM);  Surgeon: Lynwood Celestia, MD;  Location: [facility];  Service: Endoscopy;  Laterality: N/A;   ESOPHAGOGASTRODUODENOSCOPY  05/02/2011   Procedure: ESOPHAGOGASTRODUODENOSCOPY (EGD);  Surgeon: Lynwood LITTIE Celestia Mickey., MD;  Location: [facility];  Service: Endoscopy;  Laterality: N/A;   ESOPHAGOGASTRODUODENOSCOPY N/A 09/01/2014   Procedure: ESOPHAGOGASTRODUODENOSCOPY (EGD);  Surgeon: Lynwood Celestia, MD;  Location: [facility];  Service: Endoscopy;  Laterality: N/A;   IR CV LINE INJECTION  09/13/2023   IR IMAGING GUIDED PORT INSERTION  07/06/2023   KNEE SURGERY     LAPAROSCOPY     x 4   LESION REMOVAL N/A 01/18/2022   Procedure: EXCISION OF SEBACEOUS CYSTS CHEST AND NECK;  Surgeon: Vanderbilt Ned, MD;  Location: [facility];  Service: General;  Laterality: N/A;   PH IMPEDANCE STUDY N/A 11/04/2015   Procedure:  PH IMPEDANCE STUDY;  Surgeon: Lynwood Bohr, MD;  Location: [facility];  Service: Endoscopy;  Laterality: N/A;   VAGINAL HYSTERECTOMY  2010   TVH--ovaries remain   Patient Active Problem List   Diagnosis Date Noted   Sjogren syndrome with keratoconjunctivitis 12/09/2022   Other hemochromatosis 06/10/2021   Elevated LFTs 04/04/2021   Colitis 04/04/2021   Bacterial overgrowth syndrome 05/21/2020   Obesity 05/21/2020   History of endometriosis 05/21/2020   Constipation due to outlet dysfunction 02/05/2020   Gastroesophageal reflux disease 02/05/2020   Obstructive sleep apnea treated with continuous positive airway pressure (CPAP)  06/16/2017   Perennial allergic rhinitis with a nonallergic component 01/31/2017   Asthmatic bronchitis 01/31/2017   GI symptoms 01/31/2017   Family history of colon cancer 01/27/2015   Chest pain 12/13/2012   Dyspnea 12/13/2012   DVT of lower extremity (deep venous thrombosis) (HCC) 01/03/1990   PCP: Gaile New, MD  REFERRING PROVIDER: Valery Ripple, MD  REFERRING DIAG: I89.0  THERAPY DIAG:  Lymphedema, not elsewhere classified  Rationale for Evaluation and Treatment: Rehabilitation  ONSET DATE: ~2014; exacerbation in 2023  SUBJECTIVE:                                                                                                                                                                                          SUBJECTIVE STATEMENT:  Pt presents for OT to address lower quadrant, including deep abdominal lymphatics, and LLE lymphedema w/ associated pain. Pt denies LE related pain today. Pt reports she was active all day on Friday and then got a migraine which kept her at home all weekend. Pt reports she received her Flexitouch device, but the trainer set it up for BLE, instead of being able to alternate on one lower extremity at a time. The trainer told Pt that's how the order was written, so Pt has not been able to use the device since it was delivered. OT emailed [employer] rep asking for assistance with correct set up.  PERTINENT HISTORY: CVI, OSA (no CPAP), GERD, Esophageal stricture, IBS, Lactose intolerant, Diarrhea, Hx LE DVT, recent RA dx, OA  PAIN:  Are you having pain? Yes: 0/10- not rated numerically Pain location: abdomen; digestive discomfort Pain description: abdominal fullness, tightness Aggravating factors: standing, walking,  Relieving factors: MLD, Miralax ,   PRECAUTIONS: Other: LYMPHEDEMA; Hx LLE DVT  WEIGHT BEARING RESTRICTIONS: No  FALLS:  Has patient fallen in last 6 months? No  LIVING ENVIRONMENT: Lives with: lives with spouse and  daughter Lives in: House/apartment Stairs: Yes; Internal: 14 steps; yes and External: 4 steps; yes Has following equipment at home: None  OCCUPATIONTheatre Manager,  walking, desk work  LEISURE: family time  HAND DOMINANCE: right   PRIOR LEVEL OF FUNCTION: Independent  PATIENT GOALS: Feel better, be able to be more active without pain, wear preferred street shoes. NEW 08/03/23: Be able to manage digestive discomfort and gut inflammation symptoms without medications  OBJECTIVE: Note: Objective measures were completed at Evaluation unless otherwise noted.  COGNITION:  Overall cognitive status: Within functional limits for tasks assessed   POSTURE: WFL  LE ROM: WFL  LE MMT: WFL  LYMPHEDEMA ASSESSMENTS: non-Ca related  INFECTIONS: denies cellulitis and wound hx  BLE COMPARATIVE LIMB VOLUMETRICS Initial 11/15/22  LANDMARK RIGHT (dominant)  R LEG (A-D) 3028.9 ml  R THIGH (E-G) 4809.60 ml  R FULL LIMB (A-G) 7838.5 ml  Limb Volume differential (LVD)  %  Volume change since initial %  Volume change overall V  (Blank rows = not tested)  LANDMARK LEFT  (Rx)  L LEG (A-D) 3189.9 ml  L THIGH (E-G) 4959.8 ml  L FULL LIMB (A-G) 8149.7 ml  Limb Volume differential (LVD)  LEG LVD = 5.32%, L>R; THIGH LVD = 3.12%, L>R; And full LLE LVD = 3.97%, L>R.   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  Skin  Description Hyper-Keratosis Peau d' Orange Shiny Tight Fibrotic/ Indurated Fatty Doughy Spongy/ boggy       R>L x  x   Skin dry Flaky WNL Macerated   mildly      Color Redness  Varicosities Blanching Hemosiderin Stain Mottled        x   Odor Malodorous Yeast Fungal infection  WNL      x   Temperature Warm Cool wnl     x    Pitting Edema   1+ 2+ 3+ 4+ Non-pitting         x   Girth Symmetrical Asymmetrical                   Distribution    L>R toes to groin    Stemmer Sign Positive Negative   +L -R   Lymphorrhea History Of:  Present Absent     x    Wounds History Of Present Absent Venous Arterial Pressure Sheer     x        Signs of Infection Redness Warmth Erythema Acute Swelling Drainage Borders                    Sensation Light Touch Deep pressure Hypersensitivity   In tact Impaired In tact Impaired Absent Impaired   x  x  x     Nails WNL   Fungus nail dystrophy   x     Hair Growth Symmetrical Asymmetrical   x    Skin Creases Base of toes  Ankles   Base of Fingers knees       Abdominal pannus Thigh Lobules  Face/neck   x           GAIT: Distance walked: >500'  Assistive device utilized: None Level of assistance: Complete Independence Comments: Functional ambulation and transfers [facility]  LYMPHEDEMA LIFE IMPACT SCALE (LLIS): Initial 11/15/22  50%  FOTO functional outcome measure: Deferred. FOTO discontinued.  TODAY'S TREATMENT:                                                                                                                                         MLD to abdominal lymphatics Pt edu for LE self care  PATIENT EDUCATION:  Continued Pt/ CG edu for how function of cisterna chyli, part of the thoracic duct of deep abdominal lymphatics, assists with digestion by filtering many of the fats and proteins from the digestive system. Sub optimal lymphatic flow in this region may result in constipation, bloating, swelling, etc. MLD helps mobilize these protein and fats , and the rhythmic movement of manual lymphatic drainage stimulates digestive muscles and increase peristalsis. Provided printed resource for reference.   Topics include outcome of comparative limb volumetrics- starting limb volume differentials (LVDs), technology and gradient techniques used for short stretch, multilayer compression wrapping, simple self-MLD, therapeutic lymphatic pumping exercises, skin/nail care, LE precautions,. compression garment recommendations and specifications, wear and care schedule and compression garment donning / doffing w assistive devices. Discussed progress towards all OT goals since commencing CDT. All questions answered to the Pt's satisfaction. Good return. Person educated: Patient  Education method: Explanation, Demonstration, and Handouts Education comprehension: verbalized understanding, returned demonstration, verbal cues required, and needs further education  [facility] PRECAUTIONS: Pt education re precautions related to use of advanced sequential pneumatic compression device. Pt verbalized understanding that she should never use device on 2 arms/legs simultaneously and should not use device 2 x on same day to avoid overloading her heart with fluid volume return both at present, and in years to come should her medical condition change. Pt instructed to remove device immediately should she experience atypical SOP, light headedness, or acute pain. Pt instructed to discontinue pump if she suspects, or has any infection, blood clot, cellulitis, the flu, corona virus, etc.   HOME EXERCISE PROGRAM: BLE lymphatic pumping there ex using- 1 sets of 10 reps, each exercise in order-  1-2 x daily, bilaterally Simple self MLD 1 x daily Daily skin care to increase hydration, skin mobility and decrease infection risk- can be done during MLD Compression wraps 23/7 during intensive phase of CDT Compression garments/ devices during self-management phase of CDT  ASSESSMENT:  CLINICAL IMPRESSION: Pt tolerated deep abdominal MLD with very gentle short neck sequence.  Slight R lateral neck swelling is noted again today. No SOB,  dizziness, or increased pain observed or reported. Pt continues to have digestive discomfort associated with chronic constipation alternating with diarrhea.  Pt tolerated the abdominal sequence as established without increased pain. Pt continues to  benefit from MLD for enhancing gut motility and bowel function.  MLD is also known to facilitate the removal of  inflammatory substances and toxins from the intestinal area, which may help reduce inflammation linked to digestive disorders. Cont  1 x weekly through next visit, then decrease frequency to every other week for one month. Continue family training with Pt's daughter for simple MLD at home between sessions. Pt is working on obtaining compression device. Pt in agreement with this plan to reduce frequency.    (10/30/23 OT Progress Note: Pls review GOALS section below for additional details regarding progress towards goals. Pt understands and can apply lymphedema precautions after skilled training. Pt remains 100% compliant with lymphedema self care. Because lymphatic massage to one's own deep abdomen is challenging, especially with orthopedic issues Pt has had this year, Pt's daughter is learning to assist her mom with simple self MLD. Pt has also undergone a trial of the advanced Flexitouch PLUS sequential pneumatic compression device which features an abdominal, truncal, buttocks component above the level of the inguinal LN to facilitate fluid congestion towards the thoracic duct and terminus. Pt felt this helpful to her during the trial and she's hoping for insurance approval. Pt has obtained the recommended knee length compression garment and wears it for prolonged walking and dependent positioning. Limb volume measurement in the lower extremity for comparison was not performed today due to time constraints, but by visual assessment it appears unchanged. Abdominal swelling and tightness is also unchanged by visual assessment and palpation. Pt has reached a  clinical plateau and he agrees with plan to taper treatment to transition to LE self management phase of CDT. After her next session we'll reduce OT for every other week for a couple of weeks, then further reduce PRN.   (11/15/22 Initial Evaluation: Zakiah Gauthreaux is a 53 y.o. female presenting with very mild, stage II, LLE lymphedema 2/2 venous insufficiency and obesity. L Leg swelling and associated pain swelling fluctuates. It has progressed over time, and now no longer resolves with elevation over night. RLE swelling limits balance during functional ambulation. It exacerbates infection and falls risk. LLE/RLQ lymphedema interferes with functional performance in all occupational domains, including basic and instrumental ADLs, productive activities, leisure pursuits and social participation. Pt will benefit from Occupational Therapy for Complete Decongestive Therapy (CDT) to restore function, to reduce physical and psychologic suffering associated with chronic, progressive lymphedema and associated pain, and to limit infection. CDT will include manual lymphatic drainage (MLD), skin care, therapeutic exercise and compression wraps and garment's devices. Without skilled OT for lymphedema care the condition will worsen and further functional decline is expected  Custom-made gradient compression garments and HOS devices are medically necessary because they are uniquely sized and shaped to fit the exact dimensions of the affected extremities, and to provide appropriate medical grade, graduated compression essential for optimally managing chronic, progressive lymphedema. Multiple custom compression garments are needed to ensure proper hygiene to limit infection risk. Custom compression garments should be replaced q 3-6 months When worn consistently for optimal lipo-lymphedema self-management over time. HOS devices, medically necessary to limit fibrosis buildup in tissue, should be replaced q 2 years and PRN when  worn out.      OBJECTIVE IMPAIRMENTS: decreased activity tolerance, decreased knowledge of condition, decreased knowledge of use of DME, increased edema, impaired sensation, pain, and chronic, progressive leg swelling.   ACTIVITY LIMITATIONS: dependent sitting, extended standing and/ or walking, squatting, and lower body dressing, fitting preferred street shoes  PARTICIPATION LIMITATIONS: shopping, community activity, occupation, and yard work  PERSONAL FACTORS: 3+ comorbidities: Hx DVT,  OSA, CVI. Varicose veins are  also affecting patient's functional outcome.   REHAB POTENTIAL: Good  EVALUATION COMPLEXITY: Moderate   GOALS: Goals reviewed with patient? Yes  SHORT TERM GOALS: Target date: 4th OT Rx visit  SHORT TERM GOALS: Target date: 4th OT Rx visit   Pt will demonstrate understanding of lymphedema precautions and prevention strategies with modified independence using a printed reference to identify at least 5 precautions and discussing how s/he may implement them into daily life to reduce risk of progression with extra time. Baseline:Max A Goal status: GOAL MET   Pt will be able to apply multilayer, knee length, gradient, compression wraps to one leg at a time with modified assistance (extra time and assistive device/s) to decrease limb volume, to limit infection risk, and to limit lymphedema progression.  Baseline: Dependent Goal status: GOAL DISCHARGED. Pt Going to compression garments instead  LONG TERM GOALS: Target date: 02/08/23  Given this patient's Intake score of 67%/100% on the functional outcomes FOTO tool, patient will experience an increase in function of 5 points to improve basic and instrumental ADLs performance, including lymphedema self-care.  Baseline: Max A Goal status:GOAL DISCHARGED.  FOTO TOOL DISCONTINUED AT CLINIC   Given this patient's Intake score of 50% on the Lymphedema Life Impact Scale (LLIS), patient will experience a reduction of at least 5  points in her perceived level of functional impairment resulting from lymphedema to improve functional performance and quality of life (QOL). Baseline: 50% Goal status: PROGRESSING  Pt will achieve at least a 10% volume reduction in B legs to return limb to typical size and shape, to limit infection risk and LE progression, to decrease pain, to improve function. Baseline: Dependent Goal status: PROGRESSING  4.  Pt will obtain proper compression garments/devices and achieve modified independence (extra time + assistive devices) with donning/doffing to optimize limb volume reductions and limit LE  progression over time. Baseline:  Goal status:GOAL MET  5.  During Intensive phase CDT , with modified independence, Pt will achieve at least 85% compliance with all lymphedema self-care home program components, including daily skin care, compression wraps and /or garments, simple self MLD and lymphatic pumping therex to habituate LE self care protocol  into ADLs for optimal LE self-management over time. Baseline: Dependent Goal status:GOAL MET   PLAN:  OT FREQUENCY: 1 /week  OT DURATION: 12 weeks and PRN  PLANNED INTERVENTIONS: 97110-Therapeutic exercises, 97530- Therapeutic activity, 97535- Self Care, [id number]- Manual therapy, Patient/Family education, Manual lymph drainage, Compression bandaging, and fit with appropriate compression garments  PLAN FOR NEXT SESSION:  MLD as established Pt and family edu for LE self-care  Zebedee Dec, MS, OTR/L, CLT-LANA 11/20/23 1:54 PM

## 2023-11-21 ENCOUNTER — Ambulatory Visit

## 2023-11-21 DIAGNOSIS — M5459 Other low back pain: Secondary | ICD-10-CM

## 2023-11-21 DIAGNOSIS — R102 Pelvic and perineal pain unspecified side: Secondary | ICD-10-CM

## 2023-11-21 DIAGNOSIS — M6281 Muscle weakness (generalized): Secondary | ICD-10-CM

## 2023-11-21 DIAGNOSIS — M62838 Other muscle spasm: Secondary | ICD-10-CM

## 2023-11-21 DIAGNOSIS — M542 Cervicalgia: Secondary | ICD-10-CM | POA: Diagnosis not present

## 2023-11-21 DIAGNOSIS — R279 Unspecified lack of coordination: Secondary | ICD-10-CM

## 2023-11-21 NOTE — Therapy (Signed)
 OUTPATIENT PHYSICAL THERAPY FEMALE PELVIC TREATMENT     Patient Name: Karen [name] MRN: [id number] DOB:[birth year], 53 y.o., female Today's Date: 11/21/2023  END OF SESSION:  PT End of Session - 11/21/23 1236     Visit Number 57    Date for Recertification  12/26/23    Authorization Type [name]    Authorization Time Period signed dry needling consent form 08/08/23    PT Start Time 1231    PT Stop Time 1309    PT Time Calculation (min) 38 min    Activity Tolerance Patient tolerated treatment well    Behavior During Therapy [facility] for tasks assessed/performed                      Past Medical History:  Diagnosis Date   Adenomyosis    Anemia    Arthritis 2013   L knee gets steroid injections    Arthritis, rheumatic, acute or subacute    Asthmatic bronchitis 01/31/2017   Back pain    Chronic constipation    Diarrhea    DVT of lower extremity (deep venous thrombosis) (HCC)    Dysmenorrhea    Endometriosis    Esophageal stricture    G6PD deficiency    GERD (gastroesophageal reflux disease)    History of kidney stones    History of uterine fibroid    IBS (irritable bowel syndrome)    Interstitial cystitis    Lactose intolerance    Migraine    with aura   Other hemochromatosis 06/10/2021   PONV (postoperative nausea and vomiting)    Recurrent upper respiratory infection (URI)    Sebaceous cyst of breast, right lower inner quadrant 10/25/2012   Excised 11/21/12    Sleep apnea    Syncope, non cardiac    Past Surgical History:  Procedure Laterality Date   ARTHROSCOPIC HAGLUNDS REPAIR     BREAST CYST EXCISION Right 11/21/2012   Procedure: CYST EXCISION BREAST;  Surgeon: Sherlean JINNY Laughter, MD;  Location: [facility];  Service: General;  Laterality: Right;   BREAST CYST EXCISION Bilateral 01/18/2022   Procedure: EXCISION OF SEBACEOUS CYST BILATERAL BREAST;  Surgeon: Vanderbilt Ned, MD;  Location: [facility];   Service: General;  Laterality: Bilateral;   BREAST EXCISIONAL BIOPSY Right 11/2012   CESAREAN SECTION  04/19/1993   CHOLECYSTECTOMY     COLONOSCOPY  05/02/2011   Procedure: COLONOSCOPY;  Surgeon: Lynwood LITTIE Celestia Mickey., MD;  Location: [facility];  Service: Endoscopy;  Laterality: N/A;   colonoscopy  11/04/2014   DENTAL SURGERY  03/06/2018   2 surgeries ( 03/06/2018 and 04/06/2018)   to remove two separate benign tumors   ESOPHAGEAL MANOMETRY N/A 11/04/2015   Procedure: ESOPHAGEAL MANOMETRY (EM);  Surgeon: Lynwood Celestia, MD;  Location: [facility];  Service: Endoscopy;  Laterality: N/A;   ESOPHAGOGASTRODUODENOSCOPY  05/02/2011   Procedure: ESOPHAGOGASTRODUODENOSCOPY (EGD);  Surgeon: Lynwood LITTIE Celestia Mickey., MD;  Location: [facility];  Service: Endoscopy;  Laterality: N/A;   ESOPHAGOGASTRODUODENOSCOPY N/A 09/01/2014   Procedure: ESOPHAGOGASTRODUODENOSCOPY (EGD);  Surgeon: Lynwood Celestia, MD;  Location: [facility];  Service: Endoscopy;  Laterality: N/A;   IR CV LINE INJECTION  09/13/2023   IR IMAGING GUIDED PORT INSERTION  07/06/2023   KNEE SURGERY     LAPAROSCOPY     x 4   LESION REMOVAL N/A 01/18/2022   Procedure: EXCISION OF SEBACEOUS CYSTS CHEST AND NECK;  Surgeon: Vanderbilt Ned, MD;  Location: [facility];  Service: General;  Laterality: N/A;   PH IMPEDANCE STUDY N/A 11/04/2015   Procedure: PH IMPEDANCE STUDY;  Surgeon: Lynwood Bohr, MD;  Location: [facility];  Service: Endoscopy;  Laterality: N/A;   VAGINAL HYSTERECTOMY  2010   TVH--ovaries remain   Patient Active Problem List   Diagnosis Date Noted   Sjogren syndrome with keratoconjunctivitis 12/09/2022   Other hemochromatosis 06/10/2021   Elevated LFTs 04/04/2021   Colitis 04/04/2021   Bacterial overgrowth syndrome 05/21/2020   Obesity 05/21/2020   History of endometriosis 05/21/2020   Constipation due to outlet dysfunction 02/05/2020   Gastroesophageal reflux disease 02/05/2020   Obstructive sleep apnea treated  with continuous positive airway pressure (CPAP) 06/16/2017   Perennial allergic rhinitis with a nonallergic component 01/31/2017   Asthmatic bronchitis 01/31/2017   GI symptoms 01/31/2017   Family history of colon cancer 01/27/2015   Chest pain 12/13/2012   Dyspnea 12/13/2012   DVT of lower extremity (deep venous thrombosis) (HCC) 01/03/1990    PCP: Elliot Charm, MD  REFERRING PROVIDER: Cleotilde Ronal RAMAN, MD   REFERRING DIAG: M62.89 (ICD-10-CM) - Pelvic floor dysfunction  THERAPY DIAG:  Muscle weakness (generalized)  Other muscle spasm  Other low back pain  Pelvic pain  Unspecified lack of coordination  Rationale for Evaluation and Treatment: Rehabilitation  ONSET DATE: chronic  SUBJECTIVE:                                                                                                                                                                                           SUBJECTIVE STATEMENT:  Pt saw ortho last Friday and they think she has pinched nerves in low back that are giving her hip pain. She saw hematologist and she is actually anemic right now. She is waiting to have her port taken out  PAIN: 11/09/23 Are you having pain? Yes NPRS scale: 4/10 Pain location: Lower abdominal pain, Lt sided pain, low back pain  Pain type: turning, knotted, pressure Pain description: intermittent and constant   Aggravating factors: constipation or frequent bowel movements/constantly having bowel movements  Relieving factors: exercises (bowel massage, happy baby, spinal twists, walking)  PRECAUTIONS: None  WEIGHT BEARING RESTRICTIONS: No  FALLS:  Has patient fallen in last 6 months? No  LIVING ENVIRONMENT: Lives with: lives with their spouse Lives in: House/apartment  OCCUPATION: nurse, in admin  PLOF: Independent  PATIENT GOALS: decrease pain and have more normal bowel movements  PERTINENT HISTORY:  Vaginal hysterectomy, DVT LE, endometriosis,  interstitial cystitis, exploratory laps for endometriosis, c-section, cholecystectomy, RA diagnosed 06/2023 Sexual abuse: No  BOWEL MOVEMENT: Pain with bowel movement: Yes Type of bowel movement:Type (Bristol  Stool Scale) 1-7 (sometimes full range in the same bowel movement), Frequency sometimes many times a day, sometimes up to 4 days in between, and Strain Yes Fully empty rectum: No Leakage: No Pads: No Fiber supplement: No - has attempted metamucil and it made constipation worse  URINATION: Pain with urination: Yes Fully empty bladder: No Stream: varies  Urgency: Yes: all the time Frequency: multiple times an hours  Leakage: Urge to void, Walking to the bathroom, Coughing, Sneezing, Laughing, and Exercise Pads: Yes: daily  INTERCOURSE: Pain with intercourse: Initial Penetration and During Penetration Ability to have vaginal penetration:  Yes: with pain Climax: non painful WNL   PREGNANCY: Vaginal deliveries 1 Tearing Yes: 3rd degree tear C-section deliveries 1 Currently pregnant No  PROLAPSE: Periodically will feel vaginal bulging, heaviness in lower abdomen   OBJECTIVE:  10/10/23 Curl-up test: Minimal abdominal distortion focused in upper abdominals, largely improved Transversus abdominus: good core facilitation with exhale Sit-up test: 1/3  LUMBARAROM/PROM:  A/PROM A/PROM  Eval (% available) 11/09/22 (% available) 04/25/23 (% available) 10/10/23 (% available)  Flexion 50 90 90 100  Extension 25, pain anteriorly  25, pain in low back 50 75  Right lateral flexion 50, pain anteriorly  75, pain on right 50, pain on Rt 75  Left lateral flexion 50, pain anteriorly  75, pain on right 75 75   (Blank rows = not tested)    05/04/23:               Internal Pelvic Floor: mild tenderness in bil levator ani, but no increase in tension; with internal rectal exam, she was demonstrating paradoxical contraction when trying to eccentrically lengthen with exhale and had very hard time  correcting  Patient confirms identification and approves PT to assess internal pelvic floor and treatment Yes  PELVIC MMT:   MMT eval  Vaginal 2/5, 3 second hold, 3 repeat contractions  Internal Anal Sphincter 1/5  External Anal Sphincter 2/5  Puborectalis 1/5  Diastasis Recti   (Blank rows = not tested)        TONE: WNL   PROLAPSE: Grade 2 anterior vaginal wall laxity  04/25/23: Curl-up test: abdominal distortion Transversus abdominus: week, difficulty getting active contraction (not been working on strengthening)    LUMBARAROM/PROM:  A/PROM A/PROM  Eval (% available) 11/09/22 (% available) 04/25/23 (% available)  Flexion 50 90 90  Extension 25, pain anteriorly  25, pain in low back 50  Right lateral flexion 50, pain anteriorly  75, pain on right 50, pain on Rt  Left lateral flexion 50, pain anteriorly  75, pain on right 75   (Blank rows = not tested)  11/09/22: No internal rectal or vaginal pelvic floor exam performed due to high level of skin irritation   Weak transversus abdominus contraction  Abdominal restriction and tenderness in bil lower quadrant  Unable to perform pelvic tilt with appropriate coordination  LUMBARAROM/PROM:  A/PROM A/PROM  Eval (% available) 11/09/22 (% available)  Flexion 50 90  Extension 25, pain anteriorly  25, pain in low back  Right lateral flexion 50, pain anteriorly  75, pain on right  Left lateral flexion 50, pain anteriorly  75, pain on right   (Blank rows = not tested) 07/21/22: standing prolapse assessment demonstrated grade 2 anterior vaginal wall laxity   06/08/22:               External Perineal Exam: WNL  Internal Pelvic Floor: burning reported with palpation of superficial muscles; deep aching/pressure with palpation of Lt levator ani   Patient confirms identification and approves PT to assess internal pelvic floor and treatment Yes  PELVIC MMT:   MMT eval  Vaginal 3/5  Internal Anal  Sphincter 1/5  External Anal Sphincter 2/5  Puborectalis 1/5  Diastasis Recti   (Blank rows = not tested)        TONE: High in Lt levator ani   PROLAPSE: Not able to tell this session due to dyssynergic pelvic floor contraction     06/01/22: COGNITION: Overall cognitive status: Within functional limits for tasks assessed     SENSATION: Light touch: Appears intact Proprioception: Appears intact  GAIT: Comments: decreased hip extension, forward flexed trunk  POSTURE: rounded shoulders, forward head, decreased lumbar lordosis, increased thoracic kyphosis, posterior pelvic tilt, and flexed trunk    LUMBARAROM/PROM:  A/PROM A/PROM  Eval (% available)  Flexion 50  Extension 25, pain anteriorly   Right lateral flexion 50, pain anteriorly   Left lateral flexion 50, pain anteriorly    (Blank rows = not tested)   PALPATION:   General  Significant abdominal restriction and tenderness; decreased rib mobility with mobilization and breathing    TODAY'S TREATMENT:                                                                                                                              DATE:  11/21/23 Manual: Supine: ILU bowel mobilization  Abdominal scar tissue mobilization Ileocecal valve mobilization External rectum mobilization Diaphragm release Neuromuscular re-education: Seated clam + red band 3 x 10 Seated unilateral clam + red band 2 x 10 bil  Seated resisted march + red band 3 x 10 Squats 2 x 10 to table  Exercises: Standing hip circles 10x bil Seated piriformis stretch 60 sec bil   11/16/23 Manual: Supine: ILU bowel mobilization  Abdominal scar tissue mobilization Ileocecal valve mobilization External rectum mobilization Diaphragm release Exercises: Sideling clam shells + red band 2 x 10 bil Sidelying reverse clam shells 2 x 10 bil Open books 10x bil Resisted supine march + red band 2 x 10 Bridge + hip adduction 2 x 10 Bridge + march 2 x  10  11/09/23 Manual: Lt side lying Rt hip soft tissue mobilization  Supine: ILU bowel mobilization  Abdominal scar tissue mobilization Ileocecal valve mobilization External rectum mobilization Diaphragm release Exercises: Supine resisted march + red band 3 x 10 Supine resisted bil hip abduction + red band 2 x 10 Supine unilateral resisted bent knee fall out + red band 2 x 10 bil Bridge + hip adduction 2 x 10 Supine leg extension 10x bil Supine weight drop + 5lbs 2 x 10    PATIENT EDUCATION:  Education details: See above Person educated: Patient Education method: Explanation, Demonstration, Tactile cues, Verbal cues, and Handouts Education comprehension: verbalized understanding  HOME EXERCISE PROGRAM: [id number]  ASSESSMENT:  CLINICAL IMPRESSION:  Pt had consistent diarrhea last week and is wondering if it is associated with Humira . She is having headaches that are severe, but doing better today after having one yesterday. She tolerated exercises well today spending more time in seated and standing positions, but did quickly fatigue. Continued good tolerance to all manual techniques. She will continue to benefit from skilled PT intervention in order to decrease pain, improve bowel movements, and promote independence with HEP.     OBJECTIVE IMPAIRMENTS: decreased activity tolerance, decreased coordination, decreased endurance, decreased mobility, decreased strength, increased fascial restrictions, increased muscle spasms, impaired tone, postural dysfunction, and pain.   ACTIVITY LIMITATIONS: lifting, bending, continence, and locomotion level  PARTICIPATION LIMITATIONS: interpersonal relationship, community activity, and occupation  PERSONAL FACTORS: 1 comorbidity: medical history are also affecting patient's functional outcome.   REHAB POTENTIAL: Good  CLINICAL DECISION MAKING: Stable/uncomplicated  EVALUATION COMPLEXITY: Low   GOALS: Goals reviewed with patient?  Yes  SHORT TERM GOALS: Updated 10/10/23  Pt will be independent with HEP.   Baseline: Goal status: MET 07/12/22  2.  Pt will be independent with diaphragmatic breathing and down training activities in order to improve pelvic floor relaxation.  Baseline:  Goal status: MET 07/12/22  3.  Pt will be independent with the knack, urge suppression technique, and double voiding in order to improve bladder habits and decrease urinary incontinence.   Baseline:  Goal status: MET 09/22/22  4.  Pt will be independent with use of squatty potty, relaxed toileting mechanics, and improved bowel movement techniques in order to increase ease of bowel movements and complete evacuation.   Baseline:  Goal status: MET 11/09/22  5.  Pt will be able to correctly perform diaphragmatic breathing and appropriate pressure management in order to prevent worsening vaginal wall laxity and improve pelvic floor A/ROM.   Baseline:  Goal status: MET 01/18/23  LONG TERM GOALS: Updated 10/10/23  Pt will be independent with advanced HEP.   Baseline:  Goal status: IN PROGRESS 11/16/23  2.  Pt will demonstrate normal pelvic floor muscle tone and A/ROM, able to achieve 4/5 strength with contractions and 10 sec endurance, in order to provide appropriate lumbopelvic support in functional activities.   Baseline: not assessed today per patient request - no current bladder issues and they have all resolved Goal status: IN PROGRESS 11/16/23  3.  Pt will increase all impaired lumbar A/ROM by 25% without pain.  Baseline: Pt seen a little bit of loss in some and improvement in extension Goal status:  IN PROGRESS 11/16/23  4.  Pt will report pain no higher than 3/10 with any activity. Baseline: pain currently 3/10, worst is an 8/10 and often corresponds to after she is eating Goal status:  IN PROGRESS 11/16/23  5.  Pt will report 0/10 pain with vaginal penetration in order to improve intimate relationship with partner.     Baseline: no recent intercourse attempts  Goal status:  IN PROGRESS 11/16/23  6.  Pt will be able to go 2-3 hours in between voids without urgency or incontinence in order to improve QOL and perform all functional activities with less difficulty.   Baseline:  Goal status: MET 02/21/23  7.  Pt will report no leaks with laughing, coughing, sneezing in order to improve comfort with interpersonal relationships and community activities.   Baseline: Pt states that she feels 30-40% better Goal status:  MET 10/10/23  8.  Pt will have consistent bowel movements 4-5x/week without straining or pain.  Baseline:  Goal  status:  MET 02/21/23  PLAN:  PT FREQUENCY: 1-2x/week  PT DURATION: 6 months  PLANNED INTERVENTIONS: Therapeutic exercises, Therapeutic activity, Neuromuscular re-education, Balance training, Gait training, Patient/Family education, Self Care, Joint mobilization, Dry Needling, Biofeedback, and Manual therapy  PLAN FOR NEXT SESSION: progress strengthening program; prepare for discharge in the next 6-8 visits   Josette Mares, PT, DPT11/18/251:15 PM

## 2023-11-22 ENCOUNTER — Encounter: Payer: Self-pay | Admitting: Physical Therapy

## 2023-11-22 ENCOUNTER — Ambulatory Visit: Admitting: Physical Therapy

## 2023-11-22 DIAGNOSIS — M542 Cervicalgia: Secondary | ICD-10-CM

## 2023-11-22 DIAGNOSIS — M62838 Other muscle spasm: Secondary | ICD-10-CM

## 2023-11-22 DIAGNOSIS — M6281 Muscle weakness (generalized): Secondary | ICD-10-CM

## 2023-11-22 NOTE — Therapy (Signed)
 OUTPATIENT PHYSICAL THERAPY CERVICAL TREATMENT   Patient Name: Karen [name] MRN: [id number] DOB:[birth year], 53 y.o., female Today's Date: 11/22/2023  END OF SESSION:  PT End of Session - 11/22/23 1325     Visit Number 18    Date for Recertification  12/26/23    Authorization Type [name]    Authorization Time Period signed dry needling consent form 08/08/23    PT Start Time 1237    PT Stop Time 1315    PT Time Calculation (min) 38 min    Activity Tolerance Patient tolerated treatment well    Behavior During Therapy [facility] for tasks assessed/performed                    Past Medical History:  Diagnosis Date   Adenomyosis    Anemia    Arthritis 2013   L knee gets steroid injections    Arthritis, rheumatic, acute or subacute    Asthmatic bronchitis 01/31/2017   Back pain    Chronic constipation    Diarrhea    DVT of lower extremity (deep venous thrombosis) (HCC)    Dysmenorrhea    Endometriosis    Esophageal stricture    G6PD deficiency    GERD (gastroesophageal reflux disease)    History of kidney stones    History of uterine fibroid    IBS (irritable bowel syndrome)    Interstitial cystitis    Lactose intolerance    Migraine    with aura   Other hemochromatosis 06/10/2021   PONV (postoperative nausea and vomiting)    Recurrent upper respiratory infection (URI)    Sebaceous cyst of breast, right lower inner quadrant 10/25/2012   Excised 11/21/12    Sleep apnea    Syncope, non cardiac    Past Surgical History:  Procedure Laterality Date   ARTHROSCOPIC HAGLUNDS REPAIR     BREAST CYST EXCISION Right 11/21/2012   Procedure: CYST EXCISION BREAST;  Surgeon: Sherlean JINNY Laughter, MD;  Location: [facility];  Service: General;  Laterality: Right;   BREAST CYST EXCISION Bilateral 01/18/2022   Procedure: EXCISION OF SEBACEOUS CYST BILATERAL BREAST;  Surgeon: Vanderbilt Ned, MD;  Location: [facility];  Service: General;   Laterality: Bilateral;   BREAST EXCISIONAL BIOPSY Right 11/2012   CESAREAN SECTION  04/19/1993   CHOLECYSTECTOMY     COLONOSCOPY  05/02/2011   Procedure: COLONOSCOPY;  Surgeon: Lynwood LITTIE Celestia Mickey., MD;  Location: [facility];  Service: Endoscopy;  Laterality: N/A;   colonoscopy  11/04/2014   DENTAL SURGERY  03/06/2018   2 surgeries ( 03/06/2018 and 04/06/2018)   to remove two separate benign tumors   ESOPHAGEAL MANOMETRY N/A 11/04/2015   Procedure: ESOPHAGEAL MANOMETRY (EM);  Surgeon: Lynwood Celestia, MD;  Location: [facility];  Service: Endoscopy;  Laterality: N/A;   ESOPHAGOGASTRODUODENOSCOPY  05/02/2011   Procedure: ESOPHAGOGASTRODUODENOSCOPY (EGD);  Surgeon: Lynwood LITTIE Celestia Mickey., MD;  Location: [facility];  Service: Endoscopy;  Laterality: N/A;   ESOPHAGOGASTRODUODENOSCOPY N/A 09/01/2014   Procedure: ESOPHAGOGASTRODUODENOSCOPY (EGD);  Surgeon: Lynwood Celestia, MD;  Location: [facility];  Service: Endoscopy;  Laterality: N/A;   IR CV LINE INJECTION  09/13/2023   IR IMAGING GUIDED PORT INSERTION  07/06/2023   KNEE SURGERY     LAPAROSCOPY     x 4   LESION REMOVAL N/A 01/18/2022   Procedure: EXCISION OF SEBACEOUS CYSTS CHEST AND NECK;  Surgeon: Vanderbilt Ned, MD;  Location: [facility];  Service: General;  Laterality:  N/A;   PH IMPEDANCE STUDY N/A 11/04/2015   Procedure: PH IMPEDANCE STUDY;  Surgeon: Lynwood Bohr, MD;  Location: [facility];  Service: Endoscopy;  Laterality: N/A;   VAGINAL HYSTERECTOMY  2010   TVH--ovaries remain   Patient Active Problem List   Diagnosis Date Noted   Sjogren syndrome with keratoconjunctivitis 12/09/2022   Other hemochromatosis 06/10/2021   Elevated LFTs 04/04/2021   Colitis 04/04/2021   Bacterial overgrowth syndrome 05/21/2020   Obesity 05/21/2020   History of endometriosis 05/21/2020   Constipation due to outlet dysfunction 02/05/2020   Gastroesophageal reflux disease 02/05/2020   Obstructive sleep apnea treated with continuous  positive airway pressure (CPAP) 06/16/2017   Perennial allergic rhinitis with a nonallergic component 01/31/2017   Asthmatic bronchitis 01/31/2017   GI symptoms 01/31/2017   Family history of colon cancer 01/27/2015   Chest pain 12/13/2012   Dyspnea 12/13/2012   DVT of lower extremity (deep venous thrombosis) (HCC) 01/03/1990    PCP: Elliot Charm, MD   REFERRING PROVIDER: Huey Redell Fallow, PA-C  REFERRING DIAG: M54.2 (ICD-10-CM) - Cervicalgia  THERAPY DIAG:  Muscle weakness (generalized)  Other muscle spasm  Cervicalgia  Rationale for Evaluation and Treatment: Rehabilitation  ONSET DATE: July 2025  SUBJECTIVE:                                                                                                                                                                                                         SUBJECTIVE STATEMENT: MRI scheduled for next Tuesday, and port replacement next Friday. Current pain is 2/10.    Eval: Reports onset of L UT pain following port placement into R shoulder Hand dominance: Right  PERTINENT HISTORY:  07/21/2023 Assessment: - suspect left-sided cervical/ trapezius muscle spasm - reassured by absence of red flag signs/ symptoms at this time   Concurrent lymphedema PAIN:  Are you having pain? Yes: NPRS scale: 4/10 Pain location: L UT Pain description: ache, cramp Aggravating factors: activity Relieving factors: heat, medication  PRECAUTIONS: None  RED FLAGS: None     WEIGHT BEARING RESTRICTIONS: No  FALLS:  Has patient fallen in last 6 months? No  OCCUPATION: [employer] Employee  PLOF: Independent  PATIENT GOALS: To manage my neck pain  NEXT MD VISIT: TBD  OBJECTIVE:  Note: Objective measures were completed at Evaluation unless otherwise noted.  DIAGNOSTIC FINDINGS:  Oct 2025 C3-4 mod bulging disc osteophyte C4-5 mod to severe bulging disc osteophyte C5-6 severe bulging osteophyte C6-7 minimal  retrolisthesis of c6 and C7 with severe bulging disc T2-40mod left disc protrusion  subarticular  PATIENT SURVEYS:  NDI: 28/50 56% perceived disability  Minimum Detectable Change (90% confidence): 5 points or 10% points  10/03/23:  NDI  30%   The Patient-Specific Functional Scale  Initial:  I am going to ask you to identify up to 3 important activities that you are unable to do or are having difficulty with as a result of this problem.  Today are there any activities that you are unable to do or having difficulty with because of this?  (Patient shown scale and patient rated each activity)  Follow up: When you first came in you had difficulty performing these activities.  Today do you still have difficulty?  Patient-Specific activity scoring scheme (Point to one number):  0 1 2 3 4 5 6 7 8 9  10 Unable                                                                                                          Able to perform To perform                                                                                                    activity at the same Activity         Level as before                                                                                                                       Injury or problem  Activity       Computer work 1 hour                                     9/30: 5                     11/12  5  2.          Cooking  9/30: 5                                                                                         3.         Sit and watch TV                                              9/30: 5                                                                         POSTURE: rounded shoulders and forward head  PALPATION: Global tenderness throughout B UT and posterior shoulder girdle   CERVICAL ROM:   Active ROM A/PROM (deg) eval 9/30 11/12  Flexion 75%P! 47 50  Extension 75%P! 45 46  Right  lateral flexion 75%P! 36 43  Left lateral flexion 75%P! 29 31  Right rotation 75%P! 35 40  Left rotation 75%P! 29 22   (Blank rows = not tested)  UPPER EXTREMITY ROM:  Active ROM Right eval Left eval 9/30 11/12  Shoulder flexion 150 150 L 155  R 151   Shoulder extension      Shoulder abduction 150 150 L 167 R 161 R 150 L 155  Shoulder adduction      Shoulder extension      Shoulder internal rotation      Shoulder external rotation      Elbow flexion      Elbow extension      Wrist flexion      Wrist extension      Wrist ulnar deviation      Wrist radial deviation      Wrist pronation      Wrist supination       (Blank rows = not tested)  UPPER EXTREMITY MMT:  MMT Right eval Left eval 9/30 11/12  Shoulder flexion      Shoulder extension      Shoulder abduction      Shoulder adduction      Shoulder extension      Shoulder internal rotation      Shoulder external rotation      Middle trapezius   Bil 4-/5 Bil 4/5  Lower trapezius   Bil 4-/5 Bil 4/5  Elbow flexion      Elbow extension      Wrist flexion      Wrist extension      Wrist ulnar deviation      Wrist radial deviation      Wrist pronation      Wrist supination      Grip strength       (Blank rows = not tested)  CERVICAL SPECIAL TESTS:  Neck flexor muscle endurance test: Negative 9/30  decreased cervical extensor strength 4-/5 11/12: deep cervical flexors and exensors 4/5  FUNCTIONAL TESTS:  N/A  TREATMENT  11/22/2023 Update on status & upcoming appointments  Seated deep cervical nods Standing deep cervical nods on wall 2 x5 Standing deep cervical nods on wall with shoulder press against wall 10x, bil UE elevation 2x5;  Isometric deep cervical extension against PT hand 2 x 5 5 sec hold Manual:  grade 2 cervical distraction 3x 10; gentle upper trap lengthening right/left; suboccipital release; rotation mobs C2-4, lateral side glides C4-7 right/left grade 2/3     11/15/2023 Discussion of  aquatic PT after new port placement and when cleared to start Discussed benefit of strengthening cervical deep cervical flexors for stabilization Seated deep cervical nods Standing deep cervical nods on wall Standing deep cervical nods on wall with shoulder press against wall 10x, bil UE elevation 5x; green loop around wrists steering wheel turns Facing wall: red band behind head, hands holding band to wall with isometric head press 5 sec hold 10x Cervical ROM as above Shoulder ROM as above Manual:  grade 2 cervical distraction 3x 10; gentle upper trap lengthening right/left; suboccipital release; rotation mobs C2-4, lateral side glides C4-7 right/left grade 2/3   11/08/2023 Update on status and MRI results Hooklying chin tuck x 12 Hooklying scap retraction x 12 Hookyling chin tuck + elbow press down x 12 Hooklying chin tuck + shoulder press down x 12 Decompression series: whole leg lengthening, whole leg press down 5x 5 sec holds Manual: cervical occipital release x 10 5 sec holds and STM to bilateral upper traps    10/31/2023 Discussion of her visit with Dr. Duwayne regarding back pain and his referral to [facility] aquatic PT Discussed benefit of strengthening cervical deep cervical flexors for stabilization Supine deep cervical activation with: UE elevation alternating, elbow press down, shoulder press down 5x (Added to HEP- pt instructions)  Decompression series: whole leg lengthening, whole leg press down 5x 5 sec holds (Added to HEP- pt instructions) Manual:  grade 2 cervical distraction 3x 10; gentle upper trap lengthening right/left; suboccipital release Saunders cervical traction 8# static 3 min  10/25/2023 Discussed results of recent cervical MRI Postural alignment and how that affects load on cervical spine Discussed benefit of strengthening cervical deep cervical flexors for stabilization Standing green band with handles: bil shoulder extension with cue for deep cervical  flexion activation Supine deep cervical nods 5-10 sec holds (Added to HEP- see below) Seated deep cervical activation (Added to HEP- see below) Manual: seated soft tissue mobilization to right > left cervical paraspinals and suboccipitals; grade 2 cervical distraction 3x 10; avoided right upper trap soft tissue mob secondary to blood clot in right port          10/17/2023 Manual: seated soft tissue mobilization to right > left cervical paraspinals and suboccipitals; grade 2 cervical distraction 3x 10; avoided right upper trap soft tissue mob secondary to blood clot in right port Attempted thoracic extension exercises against small ball put patient had increased abdominal pain that radiated into her back Seated TA activation + ball squeeze x 20  Seated scap squeeze with light band around wrist 2 x 8  Seated shoulder abduction against light band + shoulder flexion 2 x 8 Chin tuck 2 x 10 Seated shoulder ER with light loop 2 x 8  Standing QL stretch x 8 each side Attempted foam roll roll up at [facility] but this was painful for patient's side Foam roll along patient's spine at  wall + snow angel x 8 Seated scalene  stretch 2 x 20sec bilateral                                                                                                           PATIENT EDUCATION:  Education details: Discussed eval findings, rehab rationale and POC and patient is in agreement  Person educated: Patient Education method: Explanation and Handouts Education comprehension: verbalized understanding and needs further education  HOME EXERCISE PROGRAM: Access Code: [email or web address] URL: [email or web address] Date: 10/25/2023 Prepared by: Glade Pesa  Exercises - Seated Shoulder Shrugs  - 2 x daily - 5 x weekly - 2 sets - 10 reps - Seated Scapular Retraction  - 2 x daily - 5 x weekly - 2 sets - 10 reps - Gentle Levator Scapulae Stretch  - 1 x daily - 7 x weekly - 1 sets - 2 reps - 30 seconds hold - Seated  Cervical Sidebending Stretch  - 1 x daily - 7 x weekly - 1 sets - 2 reps - 30 seconds hold - Quadricep Stretch with Chair and Counter Support  - 1 x daily - 7 x weekly - 2 sets - 20-30s hold - Standing Hamstring Stretch with Step  - 1 x daily - 7 x weekly - 2 sets - 20-30s hold - Seated Hamstring Stretch  - 1 x daily - 7 x weekly - 2 sets - 20-30 hold - Seated Hip Flexor Stretch  - 1 x daily - 7 x weekly - 2 sets - 20-30 hold - Seated Scalene Stretch with Towel  - 1 x daily - 7 x weekly - 2 sets - 20 reps - Seated Modified Small Range Crunch in Chair  - 1 x daily - 7 x weekly - 1-2 sets - 10 reps - Standing Marching  - 1 x daily - 7 x weekly - 1 sets - 10 reps - Median Nerve Flossing - Tray  - 1 x daily - 7 x weekly - 1 sets - 10 reps - Seated Cervical Traction  - 1 x daily - 7 x weekly - 1 sets - 10 reps - Seated Neck and Traction  - 1 x daily - 7 x weekly - 1 sets - 10 reps - Standing Low Shoulder Row with Anchored Resistance  - 1 x daily - 7 x weekly - 1 sets - 10 reps - Seated Thoracic Flexion and Rotation with Arms Crossed  - 1 x daily - 7 x weekly - 1 sets - 5 reps - Seated Thoracic Lumbar Extension  - 1 x daily - 7 x weekly - 1 sets - 5-10 reps - Supine Head Nod with Deep Neck Flexor Activation  - 1 x daily - 7 x weekly - 1 sets - 10 reps - Seated Deep Neck Flexor Nods  - 1 x daily - 7 x weekly - 1 sets - 10 reps ASSESSMENT:  CLINICAL IMPRESSION: Karen [name] presents with minimal pain today. Since incorporating deep neck flexor strengthening she has noted improved tolerance while working at the  computer. She had some wrist pain after band exercises last session, so discontinued those exercises today. Overall, tightness and spasms are decreasing but right cervical paraspinals are tighter. Discussed possible finishing pelvic floor therapy before starting aquatics.  Patient demonstrates good rehab potential to achieve stated goals through skilled therapy intervention.      OBJECTIVE  IMPAIRMENTS: decreased activity tolerance, decreased mobility, decreased ROM, increased fascial restrictions, increased muscle spasms, impaired UE functional use, postural dysfunction, and pain.   ACTIVITY LIMITATIONS: carrying, lifting, bending, bed mobility, and reach over head  PERSONAL FACTORS: Age, Fitness, Past/current experiences, and 1 comorbidity: lymphedema are also affecting patient's functional outcome.   REHAB POTENTIAL: Good  CLINICAL DECISION MAKING: Stable/uncomplicated  EVALUATION COMPLEXITY: Low   GOALS: Goals reviewed with patient? No    SHORT TERM GOALS=LONG TERM GOALS: Target date: 12/26/2023  Patient to demonstrate independence in HEP  Baseline: [email or web address] Goal status: ongoing  2.  Patient will acknowledge 4/10 pain at least once during episode of care   Baseline: 8/10 Goal status: ongoing  3.  Patient will score at least 20/50 on NDI to signify clinically meaningful improvement in functional abilities.   Baseline: 28/50 Goal status:  met 9/30  4.  160d AROM in B shoulder flexion/abduction Baseline: 150d Goal status: ongoing  5.  Improved cervical rotation ROM to 45 degrees needed for driving revised Goal status:   6. Be able to walk 1 hour or navigate hills at [facility] new  7. PSFS rating for computer work improved to 6-7 new  8. PSFS rating for cooking improved to 6 new   PLAN:  PT FREQUENCY: 1x/week  PT DURATION: 12 weeks   PLANNED INTERVENTIONS: 97110-Therapeutic exercises, 97530- Therapeutic activity, 97112- Neuromuscular re-education, 97535- Self Care, and [id number]- Manual therapy  PLAN FOR NEXT SESSION  check MRI results lumbar; deep cervical flexor strengthening;  postural strengthening; manual techniques as appropriate; pt states Dr. Duwayne gave her a referral for aquatic PT for lumbar spine but may need to wait until after new port placement   Kristeen Sar, PT, DPT 11/22/23 1:26 PM

## 2023-11-23 ENCOUNTER — Other Ambulatory Visit (HOSPITAL_COMMUNITY): Payer: Self-pay

## 2023-11-24 DIAGNOSIS — K2100 Gastro-esophageal reflux disease with esophagitis, without bleeding: Secondary | ICD-10-CM | POA: Diagnosis not present

## 2023-11-24 DIAGNOSIS — L280 Lichen simplex chronicus: Secondary | ICD-10-CM | POA: Diagnosis not present

## 2023-11-24 DIAGNOSIS — R1013 Epigastric pain: Secondary | ICD-10-CM | POA: Diagnosis not present

## 2023-11-24 DIAGNOSIS — R109 Unspecified abdominal pain: Secondary | ICD-10-CM | POA: Diagnosis not present

## 2023-11-24 DIAGNOSIS — R197 Diarrhea, unspecified: Secondary | ICD-10-CM | POA: Diagnosis not present

## 2023-11-24 DIAGNOSIS — L900 Lichen sclerosus et atrophicus: Secondary | ICD-10-CM | POA: Diagnosis not present

## 2023-11-27 ENCOUNTER — Ambulatory Visit: Admitting: Occupational Therapy

## 2023-11-28 ENCOUNTER — Ambulatory Visit: Admitting: Physical Therapy

## 2023-11-28 ENCOUNTER — Ambulatory Visit: Payer: Self-pay

## 2023-11-28 ENCOUNTER — Other Ambulatory Visit: Payer: Self-pay

## 2023-11-28 ENCOUNTER — Encounter: Payer: Self-pay | Admitting: Physical Therapy

## 2023-11-28 DIAGNOSIS — M6281 Muscle weakness (generalized): Secondary | ICD-10-CM

## 2023-11-28 DIAGNOSIS — M545 Low back pain, unspecified: Secondary | ICD-10-CM | POA: Diagnosis not present

## 2023-11-28 DIAGNOSIS — R279 Unspecified lack of coordination: Secondary | ICD-10-CM

## 2023-11-28 DIAGNOSIS — M542 Cervicalgia: Secondary | ICD-10-CM | POA: Diagnosis not present

## 2023-11-28 DIAGNOSIS — M62838 Other muscle spasm: Secondary | ICD-10-CM

## 2023-11-28 DIAGNOSIS — R102 Pelvic and perineal pain unspecified side: Secondary | ICD-10-CM

## 2023-11-28 NOTE — Therapy (Signed)
 OUTPATIENT PHYSICAL THERAPY CERVICAL TREATMENT   Patient Name: Karen [name] MRN: [id number] DOB:1970-07-24, 53 y.o., female Today's Date: 11/28/2023  END OF SESSION:  PT End of Session - 11/28/23 1444     Visit Number 19    Date for Recertification  12/26/23    Authorization Type [name]    Authorization Time Period signed dry needling consent form 08/08/23    PT Start Time 1237    PT Stop Time 1320    PT Time Calculation (min) 43 min    Activity Tolerance Patient tolerated treatment well    Behavior During Therapy [facility] for tasks assessed/performed                     Past Medical History:  Diagnosis Date   Adenomyosis    Anemia    Arthritis 2013   L knee gets steroid injections    Arthritis, rheumatic, acute or subacute    Asthmatic bronchitis 01/31/2017   Back pain    Chronic constipation    Diarrhea    DVT of lower extremity (deep venous thrombosis) (HCC)    Dysmenorrhea    Endometriosis    Esophageal stricture    G6PD deficiency    GERD (gastroesophageal reflux disease)    History of kidney stones    History of uterine fibroid    IBS (irritable bowel syndrome)    Interstitial cystitis    Lactose intolerance    Migraine    with aura   Other hemochromatosis 06/10/2021   PONV (postoperative nausea and vomiting)    Recurrent upper respiratory infection (URI)    Sebaceous cyst of breast, right lower inner quadrant 10/25/2012   Excised 11/21/12    Sleep apnea    Syncope, non cardiac    Past Surgical History:  Procedure Laterality Date   ARTHROSCOPIC HAGLUNDS REPAIR     BREAST CYST EXCISION Right 11/21/2012   Procedure: CYST EXCISION BREAST;  Surgeon: Sherlean JINNY Laughter, MD;  Location: [facility];  Service: General;  Laterality: Right;   BREAST CYST EXCISION Bilateral 01/18/2022   Procedure: EXCISION OF SEBACEOUS CYST BILATERAL BREAST;  Surgeon: Vanderbilt Ned, MD;  Location: [facility];  Service:  General;  Laterality: Bilateral;   BREAST EXCISIONAL BIOPSY Right 11/2012   CESAREAN SECTION  04/19/1993   CHOLECYSTECTOMY     COLONOSCOPY  05/02/2011   Procedure: COLONOSCOPY;  Surgeon: Lynwood LITTIE Celestia Mickey., MD;  Location: [facility];  Service: Endoscopy;  Laterality: N/A;   colonoscopy  11/04/2014   DENTAL SURGERY  03/06/2018   2 surgeries ( 03/06/2018 and 04/06/2018)   to remove two separate benign tumors   ESOPHAGEAL MANOMETRY N/A 11/04/2015   Procedure: ESOPHAGEAL MANOMETRY (EM);  Surgeon: Lynwood Celestia, MD;  Location: [facility];  Service: Endoscopy;  Laterality: N/A;   ESOPHAGOGASTRODUODENOSCOPY  05/02/2011   Procedure: ESOPHAGOGASTRODUODENOSCOPY (EGD);  Surgeon: Lynwood LITTIE Celestia Mickey., MD;  Location: [facility];  Service: Endoscopy;  Laterality: N/A;   ESOPHAGOGASTRODUODENOSCOPY N/A 09/01/2014   Procedure: ESOPHAGOGASTRODUODENOSCOPY (EGD);  Surgeon: Lynwood Celestia, MD;  Location: [facility];  Service: Endoscopy;  Laterality: N/A;   IR CV LINE INJECTION  09/13/2023   IR IMAGING GUIDED PORT INSERTION  07/06/2023   KNEE SURGERY     LAPAROSCOPY     x 4   LESION REMOVAL N/A 01/18/2022   Procedure: EXCISION OF SEBACEOUS CYSTS CHEST AND NECK;  Surgeon: Vanderbilt Ned, MD;  Location: [facility];  Service: General;  Laterality: N/A;   PH IMPEDANCE STUDY N/A 11/04/2015   Procedure: PH IMPEDANCE STUDY;  Surgeon: Lynwood Bohr, MD;  Location: [facility];  Service: Endoscopy;  Laterality: N/A;   VAGINAL HYSTERECTOMY  2010   TVH--ovaries remain   Patient Active Problem List   Diagnosis Date Noted   Sjogren syndrome with keratoconjunctivitis 12/09/2022   Other hemochromatosis 06/10/2021   Elevated LFTs 04/04/2021   Colitis 04/04/2021   Bacterial overgrowth syndrome 05/21/2020   Obesity 05/21/2020   History of endometriosis 05/21/2020   Constipation due to outlet dysfunction 02/05/2020   Gastroesophageal reflux disease 02/05/2020   Obstructive sleep apnea treated with  continuous positive airway pressure (CPAP) 06/16/2017   Perennial allergic rhinitis with a nonallergic component 01/31/2017   Asthmatic bronchitis 01/31/2017   GI symptoms 01/31/2017   Family history of colon cancer 01/27/2015   Chest pain 12/13/2012   Dyspnea 12/13/2012   DVT of lower extremity (deep venous thrombosis) (HCC) 01/03/1990    PCP: Elliot Charm, MD   REFERRING PROVIDER: Huey Redell Fallow, PA-C  REFERRING DIAG: M54.2 (ICD-10-CM) - Cervicalgia  THERAPY DIAG:  Muscle weakness (generalized)  Other muscle spasm  Cervicalgia  Rationale for Evaluation and Treatment: Rehabilitation  ONSET DATE: July 2025  SUBJECTIVE:                                                                                                                                                                                                         SUBJECTIVE STATEMENT: Lumbar MRI is tonight at 6pm. Met with GI doctor to discuss diarrhea. She was very tired this weekend. Some neck soreness/ tightness3 /10     Eval: Reports onset of L UT pain following port placement into R shoulder Hand dominance: Right  PERTINENT HISTORY:  07/21/2023 Assessment: - suspect left-sided cervical/ trapezius muscle spasm - reassured by absence of red flag signs/ symptoms at this time   Concurrent lymphedema PAIN:  Are you having pain? Yes: NPRS scale: 4/10 Pain location: L UT Pain description: ache, cramp Aggravating factors: activity Relieving factors: heat, medication  PRECAUTIONS: None  RED FLAGS: None     WEIGHT BEARING RESTRICTIONS: No  FALLS:  Has patient fallen in last 6 months? No  OCCUPATION: Karen [name]  PLOF: Independent  PATIENT GOALS: To manage my neck pain  NEXT MD VISIT: TBD  OBJECTIVE:  Note: Objective measures were completed at Evaluation unless otherwise noted.  DIAGNOSTIC FINDINGS:  Oct 2025 C3-4 mod bulging disc osteophyte C4-5 mod to severe bulging  disc osteophyte C5-6 severe bulging osteophyte C6-7 minimal retrolisthesis of  c6 and C7 with severe bulging disc T2-44mod left disc protrusion  subarticular  PATIENT SURVEYS:  NDI: 28/50 56% perceived disability  Minimum Detectable Change (90% confidence): 5 points or 10% points  10/03/23:  NDI  30%   The Patient-Specific Functional Scale  Initial:  I am going to ask you to identify up to 3 important activities that you are unable to do or are having difficulty with as a result of this problem.  Today are there any activities that you are unable to do or having difficulty with because of this?  (Patient shown scale and patient rated each activity)  Follow up: When you first came in you had difficulty performing these activities.  Today do you still have difficulty?  Patient-Specific activity scoring scheme (Point to one number):  0 1 2 3 4 5 6 7 8 9  10 Unable                                                                                                          Able to perform To perform                                                                                                    activity at the same Activity         Level as before                                                                                                                       Injury or problem  Activity       Computer work 1 hour                                     9/30: 5                     11/12  5  2.          Cooking  9/30: 5                                                                                         3.         Sit and watch TV                                              9/30: 5                                                                         POSTURE: rounded shoulders and forward head  PALPATION: Global tenderness throughout B UT and posterior shoulder girdle   CERVICAL ROM:   Active ROM A/PROM (deg) eval 9/30  11/12  Flexion 75%P! 47 50  Extension 75%P! 45 46  Right lateral flexion 75%P! 36 43  Left lateral flexion 75%P! 29 31  Right rotation 75%P! 35 40  Left rotation 75%P! 29 22   (Blank rows = not tested)  UPPER EXTREMITY ROM:  Active ROM Right eval Left eval 9/30 11/12  Shoulder flexion 150 150 L 155  R 151   Shoulder extension      Shoulder abduction 150 150 L 167 R 161 R 150 L 155  Shoulder adduction      Shoulder extension      Shoulder internal rotation      Shoulder external rotation      Elbow flexion      Elbow extension      Wrist flexion      Wrist extension      Wrist ulnar deviation      Wrist radial deviation      Wrist pronation      Wrist supination       (Blank rows = not tested)  UPPER EXTREMITY MMT:  MMT Right eval Left eval 9/30 11/12  Shoulder flexion      Shoulder extension      Shoulder abduction      Shoulder adduction      Shoulder extension      Shoulder internal rotation      Shoulder external rotation      Middle trapezius   Bil 4-/5 Bil 4/5  Lower trapezius   Bil 4-/5 Bil 4/5  Elbow flexion      Elbow extension      Wrist flexion      Wrist extension      Wrist ulnar deviation      Wrist radial deviation      Wrist pronation      Wrist supination      Grip strength       (Blank rows = not tested)  CERVICAL SPECIAL TESTS:  Neck flexor muscle endurance test: Negative 9/30  decreased cervical extensor strength 4-/5 11/12: deep cervical flexors and exensors 4/5  FUNCTIONAL TESTS:  N/A  TREATMENT  11/28/2023 Standing deep cervical nods x 10 at wall Standing deep cervical against purple ball x 10 Standing deep cervical nods on wall with shoulder press against wall (pressing into purple ball) 10x, bil UE elevation 2x5;  Thoracic extension against purple ball x 8 Thoracic rotation with purple ball at back x 5 each direction Lumbar extension at wall x 5  Provided exercises to help with radicular symptoms. Patient verbalized  relief.  Manual:  grade 2 cervical distraction 3x 10; gentle upper trap lengthening right/left; suboccipital release; rotation mobs C2-4, lateral side glides C4-7 right/left grade 2/3   11/22/2023 Update on status & upcoming appointments  Seated deep cervical nods Standing deep cervical nods on wall 2 x5 Standing deep cervical nods on wall with shoulder press against wall 10x, bil UE elevation 2x5;  Isometric deep cervical extension against PT hand 2 x 5 5 sec hold Manual:  grade 2 cervical distraction 3x 10; gentle upper trap lengthening right/left; suboccipital release; rotation mobs C2-4, lateral side glides C4-7 right/left grade 2/3     11/15/2023 Discussion of aquatic PT after new port placement and when cleared to start Discussed benefit of strengthening cervical deep cervical flexors for stabilization Seated deep cervical nods Standing deep cervical nods on wall Standing deep cervical nods on wall with shoulder press against wall 10x, bil UE elevation 5x; green loop around wrists steering wheel turns Facing wall: red band behind head, hands holding band to wall with isometric head press 5 sec hold 10x Cervical ROM as above Shoulder ROM as above Manual:  grade 2 cervical distraction 3x 10; gentle upper trap lengthening right/left; suboccipital release; rotation mobs C2-4, lateral side glides C4-7 right/left grade 2/3   11/08/2023 Update on status and MRI results Hooklying chin tuck x 12 Hooklying scap retraction x 12 Hookyling chin tuck + elbow press down x 12 Hooklying chin tuck + shoulder press down x 12 Decompression series: whole leg lengthening, whole leg press down 5x 5 sec holds Manual: cervical occipital release x 10 5 sec holds and STM to bilateral upper traps    10/31/2023 Discussion of her visit with Dr. Duwayne regarding back pain and his referral to [facility] Discussed benefit of strengthening cervical deep cervical flexors for stabilization Supine  deep cervical activation with: UE elevation alternating, elbow press down, shoulder press down 5x (Added to HEP- pt instructions)  Decompression series: whole leg lengthening, whole leg press down 5x 5 sec holds (Added to HEP- pt instructions) Manual:  grade 2 cervical distraction 3x 10; gentle upper trap lengthening right/left; suboccipital release Saunders cervical traction 8# static 3 min  10/25/2023 Discussed results of recent cervical MRI Postural alignment and how that affects load on cervical spine Discussed benefit of strengthening cervical deep cervical flexors for stabilization Standing green band with handles: bil shoulder extension with cue for deep cervical flexion activation Supine deep cervical nods 5-10 sec holds (Added to HEP- see below) Seated deep cervical activation (Added to HEP- see below) Manual: seated soft tissue mobilization to right > left cervical paraspinals and suboccipitals; grade 2 cervical distraction 3x 10; avoided right upper trap soft tissue mob secondary to blood clot in right port  PATIENT EDUCATION:  Education details: Discussed eval findings, rehab rationale and POC and patient is in agreement  Person educated: Patient Education method: Explanation and Handouts Education comprehension: verbalized understanding and needs further education  HOME EXERCISE PROGRAM: Access Code: [email or web address] URL: [email or web address] Date: 11/28/2023 Prepared by: Kristeen Sar  Exercises - Seated Shoulder Shrugs  - 2 x daily - 5 x weekly - 2 sets - 10 reps - Seated Scapular Retraction  - 2 x daily - 5 x weekly - 2 sets - 10 reps - Gentle Levator Scapulae Stretch  - 1 x daily - 7 x weekly - 1 sets - 2 reps - 30 seconds hold - Seated Cervical Sidebending Stretch  - 1 x daily - 7 x weekly - 1 sets - 2 reps - 30 seconds hold - Quadricep Stretch with Chair  and Counter Support  - 1 x daily - 7 x weekly - 2 sets - 20-30s hold - Standing Hamstring Stretch with Step  - 1 x daily - 7 x weekly - 2 sets - 20-30s hold - Seated Hamstring Stretch  - 1 x daily - 7 x weekly - 2 sets - 20-30 hold - Seated Hip Flexor Stretch  - 1 x daily - 7 x weekly - 2 sets - 20-30 hold - Seated Scalene Stretch with Towel  - 1 x daily - 7 x weekly - 2 sets - 20 reps - Seated Modified Small Range Crunch in Chair  - 1 x daily - 7 x weekly - 1-2 sets - 10 reps - Standing Marching  - 1 x daily - 7 x weekly - 1 sets - 10 reps - Median Nerve Flossing - Tray  - 1 x daily - 7 x weekly - 1 sets - 10 reps - Seated Cervical Traction  - 1 x daily - 7 x weekly - 1 sets - 10 reps - Seated Neck and Traction  - 1 x daily - 7 x weekly - 1 sets - 10 reps - Standing Low Shoulder Row with Anchored Resistance  - 1 x daily - 7 x weekly - 1 sets - 10 reps - Seated Thoracic Flexion and Rotation with Arms Crossed  - 1 x daily - 7 x weekly - 1 sets - 5 reps - Seated Thoracic Lumbar Extension  - 1 x daily - 7 x weekly - 1 sets - 5-10 reps - Supine Head Nod with Deep Neck Flexor Activation  - 1 x daily - 7 x weekly - 1 sets - 10 reps - Seated Deep Neck Flexor Nods  - 1 x daily - 7 x weekly - 1 sets - 10 reps - Standing Lumbar Extension at Wall - Forearms  - 1 x daily - 7 x weekly - 1 sets - 5-8 reps - 2s hold - Lying Prone  - 1 x daily - 7 x weekly - 3-7m hold - Lying Prone with 1 Pillow  - 1 x daily - 7 x weekly - 3-36m hold - Prone Press Up On Elbows  - 1 x daily - 7 x weekly - 1 sets - 5-8 reps - 3-5s hold ASSESSMENT:  CLINICAL IMPRESSION: Karen [name] presents with minimal pain today. Progressed postural strengthening exercises to include isometrics against a pilates ball. She demonstrates proper posture and neck alignment awareness with no cues. Patient verbalized increased left LE radicular symptoms so trailed extension based exercise today. She responded well to this so updated HEP to include prone  exercises. She is scheduled for  a lumbar MRI tonight so well assess those results as they become available. Patient will benefit from skilled PT to address the below impairments and improve overall function.     OBJECTIVE IMPAIRMENTS: decreased activity tolerance, decreased mobility, decreased ROM, increased fascial restrictions, increased muscle spasms, impaired UE functional use, postural dysfunction, and pain.   ACTIVITY LIMITATIONS: carrying, lifting, bending, bed mobility, and reach over head  PERSONAL FACTORS: Age, Fitness, Past/current experiences, and 1 comorbidity: lymphedema are also affecting patient's functional outcome.   REHAB POTENTIAL: Good  CLINICAL DECISION MAKING: Stable/uncomplicated  EVALUATION COMPLEXITY: Low   GOALS: Goals reviewed with patient? No    SHORT TERM GOALS=LONG TERM GOALS: Target date: 12/26/2023  Patient to demonstrate independence in HEP  Baseline: [id number] Goal status: ongoing  2.  Patient will acknowledge 4/10 pain at least once during episode of care   Baseline: 8/10 Goal status: ongoing  3.  Patient will score at least 20/50 on NDI to signify clinically meaningful improvement in functional abilities.   Baseline: 28/50 Goal status:  met 9/30  4.  160d AROM in B shoulder flexion/abduction Baseline: 150d Goal status: ongoing  5.  Improved cervical rotation ROM to 45 degrees needed for driving revised Goal status:   6. Be able to walk 1 hour or navigate hills at [facility] new  7. PSFS rating for computer work improved to 6-7 new  8. PSFS rating for cooking improved to 6 new   PLAN:  PT FREQUENCY: 1x/week  PT DURATION: 12 weeks   PLANNED INTERVENTIONS: 97110-Therapeutic exercises, 97530- Therapeutic activity, 97112- Neuromuscular re-education, 97535- Self Care, and [id number]- Manual therapy  PLAN FOR NEXT SESSION  check MRI results lumbar; deep cervical flexor strengthening;  postural strengthening; manual techniques as  appropriate; pt states Dr. Duwayne gave her a referral for aquatic PT for lumbar spine but may need to wait until after new port placement   Kristeen Sar, PT, DPT 11/28/23 2:45 PM

## 2023-11-28 NOTE — Therapy (Signed)
 OUTPATIENT PHYSICAL THERAPY FEMALE PELVIC TREATMENT     Patient Name: Karen [name] MRN: [id number] DOB:08-31-1970, 53 y.o., female Today's Date: 11/28/2023  END OF SESSION:  PT End of Session - 11/28/23 1618     Visit Number 58    Date for Recertification  12/26/23    Authorization Type [name]    PT Start Time 1618    PT Stop Time 1657    PT Time Calculation (min) 39 min    Activity Tolerance Patient tolerated treatment well    Behavior During Therapy [facility] for tasks assessed/performed                      Past Medical History:  Diagnosis Date   Adenomyosis    Anemia    Arthritis 2013   L knee gets steroid injections    Arthritis, rheumatic, acute or subacute    Asthmatic bronchitis 01/31/2017   Back pain    Chronic constipation    Diarrhea    DVT of lower extremity (deep venous thrombosis) (HCC)    Dysmenorrhea    Endometriosis    Esophageal stricture    G6PD deficiency    GERD (gastroesophageal reflux disease)    History of kidney stones    History of uterine fibroid    IBS (irritable bowel syndrome)    Interstitial cystitis    Lactose intolerance    Migraine    with aura   Other hemochromatosis 06/10/2021   PONV (postoperative nausea and vomiting)    Recurrent upper respiratory infection (URI)    Sebaceous cyst of breast, right lower inner quadrant 10/25/2012   Excised 11/21/12    Sleep apnea    Syncope, non cardiac    Past Surgical History:  Procedure Laterality Date   ARTHROSCOPIC HAGLUNDS REPAIR     BREAST CYST EXCISION Right 11/21/2012   Procedure: CYST EXCISION BREAST;  Surgeon: Sherlean JINNY Laughter, MD;  Location: [facility];  Service: General;  Laterality: Right;   BREAST CYST EXCISION Bilateral 01/18/2022   Procedure: EXCISION OF SEBACEOUS CYST BILATERAL BREAST;  Surgeon: Vanderbilt Ned, MD;  Location: [facility];  Service: General;  Laterality: Bilateral;   BREAST EXCISIONAL BIOPSY  Right 11/2012   CESAREAN SECTION  04/19/1993   CHOLECYSTECTOMY     COLONOSCOPY  05/02/2011   Procedure: COLONOSCOPY;  Surgeon: Lynwood LITTIE Celestia Mickey., MD;  Location: [facility];  Service: Endoscopy;  Laterality: N/A;   colonoscopy  11/04/2014   DENTAL SURGERY  03/06/2018   2 surgeries ( 03/06/2018 and 04/06/2018)   to remove two separate benign tumors   ESOPHAGEAL MANOMETRY N/A 11/04/2015   Procedure: ESOPHAGEAL MANOMETRY (EM);  Surgeon: Lynwood Celestia, MD;  Location: [facility];  Service: Endoscopy;  Laterality: N/A;   ESOPHAGOGASTRODUODENOSCOPY  05/02/2011   Procedure: ESOPHAGOGASTRODUODENOSCOPY (EGD);  Surgeon: Lynwood LITTIE Celestia Mickey., MD;  Location: [facility];  Service: Endoscopy;  Laterality: N/A;   ESOPHAGOGASTRODUODENOSCOPY N/A 09/01/2014   Procedure: ESOPHAGOGASTRODUODENOSCOPY (EGD);  Surgeon: Lynwood Celestia, MD;  Location: [facility];  Service: Endoscopy;  Laterality: N/A;   IR CV LINE INJECTION  09/13/2023   IR IMAGING GUIDED PORT INSERTION  07/06/2023   KNEE SURGERY     LAPAROSCOPY     x 4   LESION REMOVAL N/A 01/18/2022   Procedure: EXCISION OF SEBACEOUS CYSTS CHEST AND NECK;  Surgeon: Vanderbilt Ned, MD;  Location: [facility];  Service: General;  Laterality: N/A;   PH IMPEDANCE STUDY N/A  11/04/2015   Procedure: PH IMPEDANCE STUDY;  Surgeon: Lynwood Bohr, MD;  Location: [facility];  Service: Endoscopy;  Laterality: N/A;   VAGINAL HYSTERECTOMY  2010   TVH--ovaries remain   Patient Active Problem List   Diagnosis Date Noted   Sjogren syndrome with keratoconjunctivitis 12/09/2022   Other hemochromatosis 06/10/2021   Elevated LFTs 04/04/2021   Colitis 04/04/2021   Bacterial overgrowth syndrome 05/21/2020   Obesity 05/21/2020   History of endometriosis 05/21/2020   Constipation due to outlet dysfunction 02/05/2020   Gastroesophageal reflux disease 02/05/2020   Obstructive sleep apnea treated with continuous positive airway pressure (CPAP) 06/16/2017   Perennial  allergic rhinitis with a nonallergic component 01/31/2017   Asthmatic bronchitis 01/31/2017   GI symptoms 01/31/2017   Family history of colon cancer 01/27/2015   Chest pain 12/13/2012   Dyspnea 12/13/2012   DVT of lower extremity (deep venous thrombosis) (HCC) 01/03/1990    PCP: Elliot Charm, MD  REFERRING PROVIDER: Cleotilde Ronal RAMAN, MD   REFERRING DIAG: M62.89 (ICD-10-CM) - Pelvic floor dysfunction  THERAPY DIAG:  Muscle weakness (generalized)  Other muscle spasm  Pelvic pain  Unspecified lack of coordination  Rationale for Evaluation and Treatment: Rehabilitation  ONSET DATE: chronic  SUBJECTIVE:                                                                                                                                                                                           SUBJECTIVE STATEMENT:  Pt states that she is still having diarrhea and saw GI Friday; they are going to do SIBO test and H.Pylori test to see if this is causing the diarrhea. MRI is tonight. She has continued walking, but is feeling a little weak and tired after Humira  Friday. She is feeling like her abdomen is getting tighter. She is taking Miralax  less often and is still having 5-6 bowel movements a day.   PAIN: 11/28/23 Are you having pain? Yes NPRS scale: 4/10 Pain location: Lower abdominal pain, Lt sided pain, low back pain  Pain type: turning, knotted, pressure Pain description: intermittent and constant   Aggravating factors: constipation or frequent bowel movements/constantly having bowel movements  Relieving factors: exercises (bowel massage, happy baby, spinal twists, walking)  PRECAUTIONS: None  WEIGHT BEARING RESTRICTIONS: No  FALLS:  Has patient fallen in last 6 months? No  LIVING ENVIRONMENT: Lives with: lives with their spouse Lives in: House/apartment  OCCUPATION: nurse, in admin  PLOF: Independent  PATIENT GOALS: decrease pain and have more normal bowel  movements  PERTINENT HISTORY:  Vaginal hysterectomy, DVT LE, endometriosis, interstitial cystitis, exploratory laps for endometriosis, c-section, cholecystectomy, RA diagnosed  06/2023 Sexual abuse: No  BOWEL MOVEMENT: Pain with bowel movement: Yes Type of bowel movement:Type (Bristol Stool Scale) 1-7 (sometimes full range in the same bowel movement), Frequency sometimes many times a day, sometimes up to 4 days in between, and Strain Yes Fully empty rectum: No Leakage: No Pads: No Fiber supplement: No - has attempted metamucil and it made constipation worse  URINATION: Pain with urination: Yes Fully empty bladder: No Stream: varies  Urgency: Yes: all the time Frequency: multiple times an hours  Leakage: Urge to void, Walking to the bathroom, Coughing, Sneezing, Laughing, and Exercise Pads: Yes: daily  INTERCOURSE: Pain with intercourse: Initial Penetration and During Penetration Ability to have vaginal penetration:  Yes: with pain Climax: non painful WNL   PREGNANCY: Vaginal deliveries 1 Tearing Yes: 3rd degree tear C-section deliveries 1 Currently pregnant No  PROLAPSE: Periodically will feel vaginal bulging, heaviness in lower abdomen   OBJECTIVE:  10/10/23 Curl-up test: Minimal abdominal distortion focused in upper abdominals, largely improved Transversus abdominus: good core facilitation with exhale Sit-up test: 1/3  LUMBARAROM/PROM:  A/PROM A/PROM  Eval (% available) 11/09/22 (% available) 04/25/23 (% available) 10/10/23 (% available)  Flexion 50 90 90 100  Extension 25, pain anteriorly  25, pain in low back 50 75  Right lateral flexion 50, pain anteriorly  75, pain on right 50, pain on Rt 75  Left lateral flexion 50, pain anteriorly  75, pain on right 75 75   (Blank rows = not tested)    05/04/23:               Internal Pelvic Floor: mild tenderness in bil levator ani, but no increase in tension; with internal rectal exam, she was demonstrating paradoxical  contraction when trying to eccentrically lengthen with exhale and had very hard time correcting  Patient confirms identification and approves PT to assess internal pelvic floor and treatment Yes  PELVIC MMT:   MMT eval  Vaginal 2/5, 3 second hold, 3 repeat contractions  Internal Anal Sphincter 1/5  External Anal Sphincter 2/5  Puborectalis 1/5  Diastasis Recti   (Blank rows = not tested)        TONE: WNL   PROLAPSE: Grade 2 anterior vaginal wall laxity  04/25/23: Curl-up test: abdominal distortion Transversus abdominus: week, difficulty getting active contraction (not been working on strengthening)    LUMBARAROM/PROM:  A/PROM A/PROM  Eval (% available) 11/09/22 (% available) 04/25/23 (% available)  Flexion 50 90 90  Extension 25, pain anteriorly  25, pain in low back 50  Right lateral flexion 50, pain anteriorly  75, pain on right 50, pain on Rt  Left lateral flexion 50, pain anteriorly  75, pain on right 75   (Blank rows = not tested)  11/09/22: No internal rectal or vaginal pelvic floor exam performed due to high level of skin irritation   Weak transversus abdominus contraction  Abdominal restriction and tenderness in bil lower quadrant  Unable to perform pelvic tilt with appropriate coordination  LUMBARAROM/PROM:  A/PROM A/PROM  Eval (% available) 11/09/22 (% available)  Flexion 50 90  Extension 25, pain anteriorly  25, pain in low back  Right lateral flexion 50, pain anteriorly  75, pain on right  Left lateral flexion 50, pain anteriorly  75, pain on right   (Blank rows = not tested) 07/21/22: standing prolapse assessment demonstrated grade 2 anterior vaginal wall laxity   06/08/22:               External  Perineal Exam: WNL                              Internal Pelvic Floor: burning reported with palpation of superficial muscles; deep aching/pressure with palpation of Lt levator ani   Patient confirms identification and approves PT to assess internal pelvic  floor and treatment Yes  PELVIC MMT:   MMT eval  Vaginal 3/5  Internal Anal Sphincter 1/5  External Anal Sphincter 2/5  Puborectalis 1/5  Diastasis Recti   (Blank rows = not tested)        TONE: High in Lt levator ani   PROLAPSE: Not able to tell this session due to dyssynergic pelvic floor contraction     06/01/22: COGNITION: Overall cognitive status: Within functional limits for tasks assessed     SENSATION: Light touch: Appears intact Proprioception: Appears intact  GAIT: Comments: decreased hip extension, forward flexed trunk  POSTURE: rounded shoulders, forward head, decreased lumbar lordosis, increased thoracic kyphosis, posterior pelvic tilt, and flexed trunk    LUMBARAROM/PROM:  A/PROM A/PROM  Eval (% available)  Flexion 50  Extension 25, pain anteriorly   Right lateral flexion 50, pain anteriorly   Left lateral flexion 50, pain anteriorly    (Blank rows = not tested)   PALPATION:   General  Significant abdominal restriction and tenderness; decreased rib mobility with mobilization and breathing    TODAY'S TREATMENT:                                                                                                                              DATE:  11/28/23 Manual: Supine: ILU bowel mobilization  Abdominal scar tissue mobilization Ileocecal valve mobilization External rectum mobilization Diaphragm release Neuromuscular re-education: Seated unilateral resisted hip abduction + yellow loop 10x bil Seated march + yellow loop 2 x 10 Therapeutic activities: Heel raises 2 x 10 3 way kick 10x each, bil  Squats to table 2 x 10 Sidestepping + yellow band 2 15 step laps   11/21/23 Manual: Supine: ILU bowel mobilization  Abdominal scar tissue mobilization Ileocecal valve mobilization External rectum mobilization Diaphragm release Neuromuscular re-education: Seated clam + red band 3 x 10 Seated unilateral clam + red band 2 x 10 bil  Seated  resisted march + red band 3 x 10 Squats 2 x 10 to table  Exercises: Standing hip circles 10x bil Seated piriformis stretch 60 sec bil   11/16/23 Manual: Supine: ILU bowel mobilization  Abdominal scar tissue mobilization Ileocecal valve mobilization External rectum mobilization Diaphragm release Exercises: Sideling clam shells + red band 2 x 10 bil Sidelying reverse clam shells 2 x 10 bil Open books 10x bil Resisted supine march + red band 2 x 10 Bridge + hip adduction 2 x 10 Bridge + march 2 x 10    PATIENT EDUCATION:  Education details: See above Person educated: Patient Education method: Explanation, Facilities Manager, Actor cues, Verbal  cues, and Handouts Education comprehension: verbalized understanding  HOME EXERCISE PROGRAM: [id number]  ASSESSMENT:  CLINICAL IMPRESSION: Pt still having consistent diarrhea and is going to be worked up for this with GI. She has had some increase in fatigue after Humira  this week, but tolerated increased standing exercises today well with ability to maintain good form. Continued to tolerate all manual techniques to abdomen well. She will continue to benefit from skilled PT intervention in order to decrease pain, improve bowel movements, and promote independence with HEP.     OBJECTIVE IMPAIRMENTS: decreased activity tolerance, decreased coordination, decreased endurance, decreased mobility, decreased strength, increased fascial restrictions, increased muscle spasms, impaired tone, postural dysfunction, and pain.   ACTIVITY LIMITATIONS: lifting, bending, continence, and locomotion level  PARTICIPATION LIMITATIONS: interpersonal relationship, community activity, and occupation  PERSONAL FACTORS: 1 comorbidity: medical history are also affecting patient's functional outcome.   REHAB POTENTIAL: Good  CLINICAL DECISION MAKING: Stable/uncomplicated  EVALUATION COMPLEXITY: Low   GOALS: Goals reviewed with patient? Yes  SHORT TERM  GOALS: Updated 10/10/23  Pt will be independent with HEP.   Baseline: Goal status: MET 07/12/22  2.  Pt will be independent with diaphragmatic breathing and down training activities in order to improve pelvic floor relaxation.  Baseline:  Goal status: MET 07/12/22  3.  Pt will be independent with the knack, urge suppression technique, and double voiding in order to improve bladder habits and decrease urinary incontinence.   Baseline:  Goal status: MET 09/22/22  4.  Pt will be independent with use of squatty potty, relaxed toileting mechanics, and improved bowel movement techniques in order to increase ease of bowel movements and complete evacuation.   Baseline:  Goal status: MET 11/09/22  5.  Pt will be able to correctly perform diaphragmatic breathing and appropriate pressure management in order to prevent worsening vaginal wall laxity and improve pelvic floor A/ROM.   Baseline:  Goal status: MET 01/18/23  LONG TERM GOALS: Updated 10/10/23  Pt will be independent with advanced HEP.   Baseline:  Goal status: IN PROGRESS 11/16/23  2.  Pt will demonstrate normal pelvic floor muscle tone and A/ROM, able to achieve 4/5 strength with contractions and 10 sec endurance, in order to provide appropriate lumbopelvic support in functional activities.   Baseline: not assessed today per patient request - no current bladder issues and they have all resolved Goal status: IN PROGRESS 11/16/23  3.  Pt will increase all impaired lumbar A/ROM by 25% without pain.  Baseline: Pt seen a little bit of loss in some and improvement in extension Goal status:  IN PROGRESS 11/16/23  4.  Pt will report pain no higher than 3/10 with any activity. Baseline: pain currently 3/10, worst is an 8/10 and often corresponds to after she is eating Goal status:  IN PROGRESS 11/16/23  5.  Pt will report 0/10 pain with vaginal penetration in order to improve intimate relationship with partner.    Baseline: no recent  intercourse attempts  Goal status:  IN PROGRESS 11/16/23  6.  Pt will be able to go 2-3 hours in between voids without urgency or incontinence in order to improve QOL and perform all functional activities with less difficulty.   Baseline:  Goal status: MET 02/21/23  7.  Pt will report no leaks with laughing, coughing, sneezing in order to improve comfort with interpersonal relationships and community activities.   Baseline: Pt states that she feels 30-40% better Goal status:  MET 10/10/23  8.  Pt will have  consistent bowel movements 4-5x/week without straining or pain.  Baseline:  Goal status:  MET 02/21/23  PLAN:  PT FREQUENCY: 1-2x/week  PT DURATION: 6 months  PLANNED INTERVENTIONS: Therapeutic exercises, Therapeutic activity, Neuromuscular re-education, Balance training, Gait training, Patient/Family education, Self Care, Joint mobilization, Dry Needling, Biofeedback, and Manual therapy  PLAN FOR NEXT SESSION: progress strengthening program; prepare for discharge in the next 6-8 visits   Josette Mares, PT, DPT11/25/254:40 PM

## 2023-11-29 ENCOUNTER — Other Ambulatory Visit: Payer: Self-pay

## 2023-11-29 NOTE — H&P (Addendum)
 Chief Complaint: Hemochromatosis, heterozygous H63D mutation ; fibrin sheath port a cath tip , chest/neck pain with port a cath flushing; referred for port a cath injection, possible port a cath stripping/revision  Referring Provider(s): Ennever,P  Supervising Physician: Johann Sieving  Patient Status: [facility] - Out-pt  History of Present Illness: Karen [name] is a 53 y.o. female with PMH sig for anemia, RA, GERD with esophagitis, Schatzi's ring with prior dilation, IBS-M, endometriosis, nephrolithiasis, G6PD deficiency, uterine fibroids with prior hysterectomy, IC, alpha gal syndrome, sleep apnea and hemochromatosis, heterozygous H63D mutation . She is known to IR team from port a cath placement on 07/06/23 due to poor venous access. On 09/13/23 she underwent port injection due to complaints of rt neck and arm swelling and this revealed patent intact right IJ PowerPort catheter with trace catheter tip fibrin sheath. Port TPA dwell failed to relieve issues with pain/edema at site and she presents today for repeat port evaluation/possible port stripping or replacement. She also complains of persistent chest/port site/neck pain with port flushing.    Patient is Full Code  Past Medical History:  Diagnosis Date   Adenomyosis    Anemia    Arthritis 2013   L knee gets steroid injections    Arthritis, rheumatic, acute or subacute    Asthmatic bronchitis 01/31/2017   Back pain    Chronic constipation    Diarrhea    DVT of lower extremity (deep venous thrombosis) (HCC)    Dysmenorrhea    Endometriosis    Esophageal stricture    G6PD deficiency    GERD (gastroesophageal reflux disease)    History of kidney stones    History of uterine fibroid    IBS (irritable bowel syndrome)    Interstitial cystitis    Lactose intolerance    Migraine    with aura   Other hemochromatosis 06/10/2021   PONV (postoperative nausea and vomiting)    Recurrent upper respiratory infection (URI)    Sebaceous  cyst of breast, right lower inner quadrant 10/25/2012   Excised 11/21/12    Sleep apnea    Syncope, non cardiac     Past Surgical History:  Procedure Laterality Date   ARTHROSCOPIC HAGLUNDS REPAIR     BREAST CYST EXCISION Right 11/21/2012   Procedure: CYST EXCISION BREAST;  Surgeon: Sherlean JINNY Laughter, MD;  Location: [facility];  Service: General;  Laterality: Right;   BREAST CYST EXCISION Bilateral 01/18/2022   Procedure: EXCISION OF SEBACEOUS CYST BILATERAL BREAST;  Surgeon: Vanderbilt Ned, MD;  Location: [facility];  Service: General;  Laterality: Bilateral;   BREAST EXCISIONAL BIOPSY Right 11/2012   CESAREAN SECTION  04/19/1993   CHOLECYSTECTOMY     COLONOSCOPY  05/02/2011   Procedure: COLONOSCOPY;  Surgeon: Lynwood LITTIE Celestia Mickey., MD;  Location: [facility];  Service: Endoscopy;  Laterality: N/A;   colonoscopy  11/04/2014   DENTAL SURGERY  03/06/2018   2 surgeries ( 03/06/2018 and 04/06/2018)   to remove two separate benign tumors   ESOPHAGEAL MANOMETRY N/A 11/04/2015   Procedure: ESOPHAGEAL MANOMETRY (EM);  Surgeon: Lynwood Celestia, MD;  Location: [facility];  Service: Endoscopy;  Laterality: N/A;   ESOPHAGOGASTRODUODENOSCOPY  05/02/2011   Procedure: ESOPHAGOGASTRODUODENOSCOPY (EGD);  Surgeon: Lynwood LITTIE Celestia Mickey., MD;  Location: [facility];  Service: Endoscopy;  Laterality: N/A;   ESOPHAGOGASTRODUODENOSCOPY N/A 09/01/2014   Procedure: ESOPHAGOGASTRODUODENOSCOPY (EGD);  Surgeon: Lynwood Celestia, MD;  Location: [facility];  Service: Endoscopy;  Laterality: N/A;   IR CV LINE  INJECTION  09/13/2023   IR IMAGING GUIDED PORT INSERTION  07/06/2023   KNEE SURGERY     LAPAROSCOPY     x 4   LESION REMOVAL N/A 01/18/2022   Procedure: EXCISION OF SEBACEOUS CYSTS CHEST AND NECK;  Surgeon: Vanderbilt Ned, MD;  Location: [facility];  Service: General;  Laterality: N/A;   PH IMPEDANCE STUDY N/A 11/04/2015   Procedure: PH IMPEDANCE STUDY;  Surgeon: Lynwood Bohr, MD;  Location: [facility];  Service: Endoscopy;  Laterality: N/A;   VAGINAL HYSTERECTOMY  2010   TVH--ovaries remain    Allergies: Chlorhexidine , Molds & smuts, and Methotrexate   Medications: Prior to Admission medications   Medication Sig Start Date End Date Taking? Authorizing Provider  Acetaminophen  500 MG capsule 1 capsule as needed Orally every 6 hrs    [provider]  adalimumab  (HUMIRA , 2 PEN,) 40 MG/0.4ML pen 0.4 mL Subcutaneous EVERY 2 WEEKS for 30 days 06/26/23   [provider]  adalimumab  (HUMIRA , 2 PEN,) 40 MG/0.4ML pen Inject 0.4 mLs (40 mg total) into the skin every 14 (fourteen) days. 08/08/23     albuterol  (VENTOLIN  HFA) 108 (90 Base) MCG/ACT inhaler Inhale 2 puffs into the lungs every 6 (six) hours as needed for wheezing or shortness of breath. 12/08/22   Iva Marty Saltness, MD  Elderberry 575 MG/5ML SYRP Take 30 mLs by mouth daily.    [provider]  fluticasone  (FLONASE ) 50 MCG/ACT nasal spray Place 2 sprays into both nostrils daily. 10/31/23   Karis Clunes, MD  Lactobacillus Casei-Folic Acid  (RESTORA RX) 60-1.25 MG CAPS Take 1 capsule by mouth every morning. 11/14/22     lansoprazole  (PREVACID ) 30 MG capsule Take 1 capsule (30 mg total) by mouth daily. 06/29/23     levocetirizine (XYZAL ) 5 MG tablet Take 1 tablet (5 mg total) by mouth every evening. 09/25/23   Iva Marty Saltness, MD  lidocaine -prilocaine  (EMLA ) cream Apply 1 Application topically as needed. 07/11/23   Timmy Maude SAUNDERS, MD  Lifitegrast  (XIIDRA ) 5 % SOLN Place 1 drop into both eyes 2 (two) times daily. 10/12/22     methocarbamol  (ROBAXIN ) 500 MG tablet Take 1 tablet (500 mg total) by mouth 3 (three) times daily. 07/21/23     ondansetron  (ZOFRAN -ODT) 4 MG disintegrating tablet Dissolve 1 tablet (4 mg total) in mouth every 8 (eight) hours as needed for nausea 03/03/22     ondansetron  (ZOFRAN -ODT) 4 MG disintegrating tablet Take 1 tablet (4 mg total) by mouth every 8 (eight)  hours as needed for nausea. 06/29/23     polyethylene glycol (MIRALAX  / GLYCOLAX ) 17 g packet Take 17 g by mouth every other day.    [provider]  predniSONE  (STERAPRED UNI-PAK 21 TAB) 5 MG (21) TBPK tablet Take by mouth as directed on package instructions for 6 days. Take with food. 10/27/23     rizatriptan  (MAXALT ) 5 MG tablet Take 1 tablet (5 mg total) by mouth as needed for migraine. May repeat in 2 hours if needed, no more than 2 pills/24 hours, no more than 3 pills/week. 11/10/22   Athar, Saima, MD  triamcinolone  ointment (KENALOG ) 0.1 % Apply pea sized amount to affected vulvar and perianal area three times per week. Increase to twice daily for flares. 05/05/23     UNABLE TO FIND Med Name: NIFEDIPINE 0.3%/LIDO1.5% OINT Patient not taking: Reported on 11/14/2023    [provider]     Family History  Problem Relation Age of Onset   Colon  cancer Father 35   Hypertension Father    Stroke Father    Heart disease Father    Heart attack Father    Hyperlipidemia Father    Colon cancer Mother 64   Diabetes Maternal Uncle    Stroke Paternal Grandmother    Heart attack Paternal Grandmother    Heart attack Maternal Grandmother    Heart attack Maternal Grandfather    Cancer Maternal Uncle    Hypertension Sister    Diabetes Brother    Breast cancer Neg Hx     Social History   Socioeconomic History   Marital status: Married    Spouse name: Lynwood   Number of children: 2   Years of education: Not on file   Highest education level: Not on file  Occupational History   Occupation: Financial Risk Analyst  Tobacco Use   Smoking status: Never    Passive exposure: Never   Smokeless tobacco: Never  Vaping Use   Vaping status: Never Used  Substance and Sexual Activity   Alcohol use: Not Currently   Drug use: No   Sexual activity: Not Currently    Partners: Male    Birth control/protection: None, Surgical    Comment: TVH-ovaries remain  Other Topics Concern   Not on  file  Social History Narrative   Are you right handed or left handed? Right Handed    Are you currently employed ? 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 No   Who lives with you? Husband and daughters   What type of home do you live in: 1 story or 2 story? Lives in a two story home    Caffiene 3 times week.  decaffienated mostly       Social Drivers of Corporate Investment Banker Strain: Not on file  Food Insecurity: No Food Insecurity (04/30/2020)   Hunger Vital Sign    Worried About Running Out of Food in the Last Year: Never true    Ran Out of Food in the Last Year: Never true  Transportation Needs: Not on file  Physical Activity: Not on file  Stress: Not on file  Social Connections: Not on file       Review of Systems: currently denies fever,HA,CP,dyspnea, cough, vomiting or bleeding; she does have abd/back discomfort, nausea  Vital Signs: Vitals:   12/01/23 0807  BP: 139/88  Pulse: 78  Resp: 16  Temp: 98.1 F (36.7 C)  SpO2: 97%     Advance Care Plan: no documents on file    Physical Exam: awake/alert; chest- CTA bilat; intact rt chest wall port a cath with some tenderness to palpation port reservoir site/rt internal jugular access site, no sig erythema/signs of infection; heart- RRR; abd-soft,+BS, some mild RLQ tenderness to palpation; no LE edema  Imaging: No results found.  Labs:  CBC: Recent Labs    09/01/23 1529 09/04/23 1709 10/04/23 1443 11/14/23 1215  WBC 4.6 5.5 4.5 5.4  HGB 11.9* 13.5 12.6 11.6*  HCT 36.6 41.7 39.2 35.1*  PLT 201 215 204 207    COAGS: No results for input(s): INR, APTT in the last 8760 hours.  BMP: Recent Labs    07/11/23 1430 09/01/23 1529 09/04/23 1709 11/14/23 1215  NA 140 141 137 141  K 3.9 4.0 4.8 3.9  CL 106 105 104 106  CO2 27 25 19* 24  GLUCOSE 97 93 90 96  BUN  10 7 13 11   CALCIUM 9.5 9.3 9.9 9.4  CREATININE 0.64 0.64 0.80 0.69   GFRNONAA >60 >60 >60 >60    LIVER FUNCTION TESTS: Recent Labs    09/01/23 1529 09/04/23 1709 10/05/23 1634 11/14/23 1215  BILITOT 0.4 0.6 0.6 0.5  AST 20 33 19 20  ALT 17 17 18 17   ALKPHOS 134* 137* 134 124  PROT 7.1 7.8 7.3 7.3  ALBUMIN 4.3 4.4 4.5 4.4    TUMOR MARKERS: No results for input(s): AFPTM, CEA, CA199, CHROMGRNA in the last 8760 hours.  Assessment and Plan: 53 y.o. female with PMH sig for anemia, RA, GERD with esophagitis, Schatzi's ring with prior dilation, IBS-M, endometriosis, nephrolithiasis, G6PD deficiency, uterine fibroids with prior hysterectomy, IC, alpha gal syndrome, sleep apnea and hemochromatosis, heterozygous H63D mutation . She is known to IR team from port a cath placement on 07/06/23 due to poor venous access. On 09/13/23 she underwent port injection due to complaints of rt neck and arm swelling and this revealed patent intact right IJ PowerPort catheter with trace catheter tip fibrin sheath. Port TPA dwell failed to relieve issues with pain/edema at site and she presents today for repeat port evaluation/possible port stripping or replacement. She also complains of persistent chest/port site/neck pain with port flushing. Risks and benefits of image guided port-a-catheter injection/possible stripping/revision was discussed with the patient /sposue including, but not limited to bleeding, infection, pneumothorax, or fibrin sheath development and need for additional procedures.  All of the patient's questions were answered, patient is agreeable to proceed. Consent signed and in chart.    Thank you for allowing our service to participate in TRAEH MILROY 's care.  Electronically Signed: D. Franky Rakers, PA-C   11/29/2023, 2:56 PM      I spent a total of  20 minutes   in face to face in clinical consultation, greater than 50% of which was counseling/coordinating care for image guided port a cath injection/ fibrin sheath stripping/possible  replacement

## 2023-12-01 ENCOUNTER — Other Ambulatory Visit: Payer: Self-pay

## 2023-12-01 ENCOUNTER — Ambulatory Visit (HOSPITAL_COMMUNITY)
Admission: RE | Admit: 2023-12-01 | Discharge: 2023-12-01 | Disposition: A | Discharge status: Home / Self Care | Source: Ambulatory Visit | Attending: Hematology & Oncology | Admitting: Hematology & Oncology

## 2023-12-01 ENCOUNTER — Other Ambulatory Visit: Payer: Self-pay | Admitting: Hematology & Oncology

## 2023-12-01 ENCOUNTER — Ambulatory Visit (HOSPITAL_COMMUNITY)
Admission: RE | Admit: 2023-12-01 | Discharge: 2023-12-01 | Disposition: A | Source: Ambulatory Visit | Attending: Hematology & Oncology

## 2023-12-01 ENCOUNTER — Encounter (HOSPITAL_COMMUNITY): Payer: Self-pay

## 2023-12-01 DIAGNOSIS — G4730 Sleep apnea, unspecified: Secondary | ICD-10-CM | POA: Insufficient documentation

## 2023-12-01 DIAGNOSIS — Z95828 Presence of other vascular implants and grafts: Secondary | ICD-10-CM

## 2023-12-01 DIAGNOSIS — T82514A Breakdown (mechanical) of infusion catheter, initial encounter: Secondary | ICD-10-CM | POA: Diagnosis not present

## 2023-12-01 DIAGNOSIS — D75A Glucose-6-phosphate dehydrogenase (G6PD) deficiency without anemia: Secondary | ICD-10-CM | POA: Diagnosis not present

## 2023-12-01 DIAGNOSIS — Y848 Other medical procedures as the cause of abnormal reaction of the patient, or of later complication, without mention of misadventure at the time of the procedure: Secondary | ICD-10-CM | POA: Diagnosis not present

## 2023-12-01 DIAGNOSIS — Z452 Encounter for adjustment and management of vascular access device: Secondary | ICD-10-CM | POA: Diagnosis not present

## 2023-12-01 DIAGNOSIS — Z86018 Personal history of other benign neoplasm: Secondary | ICD-10-CM | POA: Insufficient documentation

## 2023-12-01 DIAGNOSIS — M069 Rheumatoid arthritis, unspecified: Secondary | ICD-10-CM | POA: Insufficient documentation

## 2023-12-01 DIAGNOSIS — K219 Gastro-esophageal reflux disease without esophagitis: Secondary | ICD-10-CM | POA: Insufficient documentation

## 2023-12-01 HISTORY — PX: IR REMOVAL TUN ACCESS W/ PORT W/O FL MOD SED: IMG2290

## 2023-12-01 HISTORY — PX: IR IMAGING GUIDED PORT INSERTION: IMG5740

## 2023-12-01 HISTORY — PX: IR CV LINE INJECTION: IMG2294

## 2023-12-01 MED ORDER — MIDAZOLAM HCL 2 MG/2ML IJ SOLN
INTRAMUSCULAR | Status: AC
Start: 2023-12-01 — End: 2023-12-01
  Filled 2023-12-01: qty 2

## 2023-12-01 MED ORDER — FENTANYL CITRATE (PF) 100 MCG/2ML IJ SOLN
INTRAMUSCULAR | Status: DC | PRN
  Administered 2023-12-01: 50 ug via INTRAVENOUS

## 2023-12-01 MED ORDER — ONDANSETRON HCL 4 MG/2ML IJ SOLN
INTRAMUSCULAR | Status: AC
  Filled 2023-12-01: qty 2

## 2023-12-01 MED ORDER — IOHEXOL 300 MG/ML  SOLN
50.0000 mL | Freq: Once | INTRAMUSCULAR | Status: AC | PRN
  Administered 2023-12-01: 50 mL

## 2023-12-01 MED ORDER — FENTANYL CITRATE (PF) 100 MCG/2ML IJ SOLN
INTRAMUSCULAR | Status: AC
  Filled 2023-12-01: qty 2

## 2023-12-01 MED ORDER — SODIUM CHLORIDE 0.9 % IV SOLN: INTRAVENOUS | Status: DC

## 2023-12-01 MED ORDER — MIDAZOLAM HCL (PF) 2 MG/2ML IJ SOLN
INTRAMUSCULAR | Status: DC | PRN
  Administered 2023-12-01: 1 mg via INTRAVENOUS

## 2023-12-01 MED ORDER — MIDAZOLAM HCL 2 MG/2ML IJ SOLN
INTRAMUSCULAR | Status: AC
  Filled 2023-12-01: qty 2

## 2023-12-01 MED ORDER — HEPARIN SOD (PORK) LOCK FLUSH 100 UNIT/ML IV SOLN
500.0000 [IU] | Freq: Once | INTRAVENOUS | Status: AC
  Administered 2023-12-01: 500 [IU] via INTRAVENOUS

## 2023-12-01 MED ORDER — HEPARIN SOD (PORK) LOCK FLUSH 100 UNIT/ML IV SOLN
INTRAVENOUS | Status: AC
  Filled 2023-12-01: qty 5

## 2023-12-01 MED ORDER — ONDANSETRON HCL 4 MG/2ML IJ SOLN
INTRAMUSCULAR | Status: DC | PRN
  Administered 2023-12-01: 4 mg via INTRAVENOUS

## 2023-12-01 MED ORDER — LIDOCAINE-EPINEPHRINE 1 %-1:100000 IJ SOLN
INTRAMUSCULAR | Status: AC
Start: 2023-12-01 — End: 2023-12-01
  Filled 2023-12-01: qty 1

## 2023-12-01 MED ORDER — LIDOCAINE-EPINEPHRINE 1 %-1:100000 IJ SOLN
20.0000 mL | Freq: Once | INTRAMUSCULAR | Status: AC
  Administered 2023-12-01: 20 mL via INTRADERMAL

## 2023-12-01 NOTE — Discharge Instructions (Signed)
 Please call Interventional Radiology clinic [phone] with any questions or concerns.  You may remove your dressing and shower tomorrow.  After the procedure, it is common to have: Discomfort at the port insertion site. Bruising on the skin over the port. This should improve over 3-4 days  Follow these instructions at home:  Medication: Do not use Aspirin or ibuprofen products, such as Advil or Motrin, as it may increase bleeding.  You may resume your usual medications as ordered by your doctor. If your doctor prescribed antibiotics, take them as directed. Do not stop taking them just because you feel better. You need to take the full course of antibiotics.  Eating and drinking: Drink plenty of liquids to keep your urine pale yellow You can resume your regular diet as directed by your doctor   Care of the procedure site Follow instructions from your health care provider about how to take care of your port insertion site. Make sure you: After your port is placed, you will get a manufacturer's information card. The card has information about your port. Keep this card with you at all times Make sure to remember what type of port you have Take care of the port as told by your health care provider DO NOT use EMLA cream for 2 weeks after port placement -the cream will remove the surgical glue on your incision DO NOT use any lotions, creams, or ointments on incision for 2 weeks. This will remove the surgical glue on your incision Wash your hands with soap and water before and after you change your bandage (dressing). If soap and water are not available, use hand sanitizer Change your dressing as told by your health care provider Leave skin glue, or adhesive strips in place. These skin closures may need to stay in place for 2 weeks or longer Check your port insertion site every day for signs of infection. Check for: Redness, swelling, or pain Fluid or blood Warmth Pus or a bad  smell  Activity Return to your normal activities as told by your health care provider. Ask your health care provider what activities are safe for you Do not lift anything that is heavier than 10 lb (4.5 kg), or the limit that you are told, until your health care provider says that it is safe Do not take baths, swim, or use a hot tub until your health care provider approves. Take showers only. Keep all follow-up visits as told by your doctor  Contact a health care provider if: You cannot flush your port with saline as directed, or you cannot draw blood from the port You have a fever or chills You have redness, swelling, or pain around your port insertion site You have fluid or blood coming from your port insertion site Your port insertion site feels warm to the touch You have pus or a bad smell coming from the port insertion site  Get help right away if: You have chest pain or shortness of breath You have bleeding from your port that you cannot control  Moderate Conscious Sedation-Care After  This sheet gives you information about how to care for yourself after your procedure. Your health care provider may also give you more specific instructions. If you have problems or questions, contact your health care provider.  After the procedure, it is common to have: Sleepiness for several hours. Impaired judgment for several hours. Difficulty with balance. Vomiting if you eat too soon.  Follow these instructions at home:  Rest. Do not  participate in activities where you could fall or become injured. Do not drive or use machinery. Do not drink alcohol. Do not take sleeping pills or medicines that cause drowsiness. Do not make important decisions or sign legal documents. Do not take care of children on your own.  Eating and drinking Follow the diet recommended by your health care provider. Drink enough fluid to keep your urine pale yellow. If you vomit: Drink water, juice, or soup  when you can drink without vomiting. Make sure you have little or no nausea before eating solid foods.  General instructions Take over-the-counter and prescription medicines only as told by your health care provider. Have a responsible adult stay with you for the time you are told. It is important to have someone help care for you until you are awake and alert. Do not smoke. Keep all follow-up visits as told by your health care provider. This is important.  Contact a health care provider if: You are still sleepy or having trouble with balance after 24 hours. You feel light-headed. You keep feeling nauseous or you keep vomiting. You develop a rash. You have a fever. You have redness or swelling around the IV site.  Get help right away if: You have trouble breathing. You have new-onset confusion at home.  This information is not intended to replace advice given to you by your health care provider. Make sure you discuss any questions you have with your healthcare provider.

## 2023-12-01 NOTE — Progress Notes (Signed)
 Specialty Pharmacy Refill Coordination Note  Karen [name] is a 53 y.o. female contacted today regarding refills of specialty medication(s) Adalimumab  (Humira  (2 Pen))   Patient requested Pickup at [facility] at [place] date: 12/04/23   Medication will be filled on: 12/01/23

## 2023-12-01 NOTE — Procedures (Signed)
  Procedure:  1. R port catheter injection 2. L internal jugular port catheter placement 3. R port catheter removal due to pain Preprocedure diagnosis: Diagnoses of Hereditary hemochromatosis and Port-A-Cath in place were pertinent to this visit. Postprocedure diagnosis: same EBL:    minimal Complications:   none immediate  See full dictation in [employer].  CHARM Toribio Faes MD Main # [phone] Pager  [phone] Mobile [phone]

## 2023-12-01 NOTE — Sedation Documentation (Signed)
 RN McKenzie and RN Rosina pulled 4mg  Versed  and 200mcg Fentanyl  in IR room 1. Pt. Received 3mg  Versed  and 150mcg Fentanyl  throughout the procedure. RN McKenize and RN Rosina wasted 1mg  Versed  and 50mcg Fentanyl  in IR room 1.

## 2023-12-04 ENCOUNTER — Ambulatory Visit: Admitting: Occupational Therapy

## 2023-12-05 ENCOUNTER — Ambulatory Visit: Admitting: Allergy & Immunology

## 2023-12-06 ENCOUNTER — Ambulatory Visit: Attending: Orthopedic Surgery | Admitting: Physical Therapy

## 2023-12-06 ENCOUNTER — Telehealth: Payer: Self-pay | Admitting: Neurology

## 2023-12-06 ENCOUNTER — Encounter: Payer: Self-pay | Admitting: Physical Therapy

## 2023-12-06 DIAGNOSIS — M542 Cervicalgia: Secondary | ICD-10-CM | POA: Diagnosis present

## 2023-12-06 DIAGNOSIS — M5459 Other low back pain: Secondary | ICD-10-CM | POA: Diagnosis present

## 2023-12-06 DIAGNOSIS — R102 Pelvic and perineal pain unspecified side: Secondary | ICD-10-CM | POA: Diagnosis present

## 2023-12-06 DIAGNOSIS — M6281 Muscle weakness (generalized): Secondary | ICD-10-CM | POA: Diagnosis present

## 2023-12-06 DIAGNOSIS — R279 Unspecified lack of coordination: Secondary | ICD-10-CM | POA: Insufficient documentation

## 2023-12-06 DIAGNOSIS — M62838 Other muscle spasm: Secondary | ICD-10-CM | POA: Insufficient documentation

## 2023-12-06 NOTE — Telephone Encounter (Signed)
 Pt called stating that stated medication and she is down to her last 2 and Pt stated her migraines are getting worse . Pt stated  she would like to discuss what she can do  to help her not have really bad migraines  ,  Pt wants to know is there something different she can do . Pt mentioned she has OA in her back , and that may be what's causing the headaches but she is not sure . Pt is also not sure if it can be medication . Pt is concern and not sure what to do . Pt is currently taking  rizatriptan  (MAXALT ) 5 MG tablet

## 2023-12-06 NOTE — Telephone Encounter (Signed)
 Dr Buck prescribed 10 tablets of Rizatriptan  a year ago. Reviewed chart. Pt last seen 05/2023. She needs an appt to discuss migraine med management.

## 2023-12-06 NOTE — Therapy (Signed)
 OUTPATIENT PHYSICAL THERAPY CERVICAL TREATMENT   Patient Name: Karen [name] MRN: [id number] DOB:[birth year], 53 y.o., female Today's Date: 12/06/2023  END OF SESSION:  PT End of Session - 12/06/23 1349     Visit Number 20    Date for Recertification  12/26/23    Authorization Type [name] Employee    Authorization Time Period signed dry needling consent form 08/08/23    PT Start Time 1236    PT Stop Time 1318    PT Time Calculation (min) 42 min    Activity Tolerance Patient tolerated treatment well    Behavior During Therapy [facility] for tasks assessed/performed                      Past Medical History:  Diagnosis Date   Adenomyosis    Anemia    Arthritis 2013   L knee gets steroid injections    Arthritis, rheumatic, acute or subacute    Asthmatic bronchitis 01/31/2017   Back pain    Chronic constipation    Diarrhea    DVT of lower extremity (deep venous thrombosis) (HCC)    Dysmenorrhea    Endometriosis    Esophageal stricture    G6PD deficiency    GERD (gastroesophageal reflux disease)    History of kidney stones    History of uterine fibroid    IBS (irritable bowel syndrome)    Interstitial cystitis    Lactose intolerance    Migraine    with aura   Other hemochromatosis 06/10/2021   PONV (postoperative nausea and vomiting)    Recurrent upper respiratory infection (URI)    Sebaceous cyst of breast, right lower inner quadrant 10/25/2012   Excised 11/21/12    Sleep apnea    Syncope, non cardiac    Past Surgical History:  Procedure Laterality Date   ARTHROSCOPIC HAGLUNDS REPAIR     BREAST CYST EXCISION Right 11/21/2012   Procedure: CYST EXCISION BREAST;  Surgeon: Sherlean JINNY Laughter, MD;  Location: [facility];  Service: General;  Laterality: Right;   BREAST CYST EXCISION Bilateral 01/18/2022   Procedure: EXCISION OF SEBACEOUS CYST BILATERAL BREAST;  Surgeon: Vanderbilt Ned, MD;  Location: [facility];  Service:  General;  Laterality: Bilateral;   BREAST EXCISIONAL BIOPSY Right 11/2012   CESAREAN SECTION  04/19/1993   CHOLECYSTECTOMY     COLONOSCOPY  05/02/2011   Procedure: COLONOSCOPY;  Surgeon: Lynwood LITTIE Celestia Mickey., MD;  Location: [facility];  Service: Endoscopy;  Laterality: N/A;   colonoscopy  11/04/2014   DENTAL SURGERY  03/06/2018   2 surgeries ( 03/06/2018 and 04/06/2018)   to remove two separate benign tumors   ESOPHAGEAL MANOMETRY N/A 11/04/2015   Procedure: ESOPHAGEAL MANOMETRY (EM);  Surgeon: Lynwood Celestia, MD;  Location: [facility];  Service: Endoscopy;  Laterality: N/A;   ESOPHAGOGASTRODUODENOSCOPY  05/02/2011   Procedure: ESOPHAGOGASTRODUODENOSCOPY (EGD);  Surgeon: Lynwood LITTIE Celestia Mickey., MD;  Location: [facility];  Service: Endoscopy;  Laterality: N/A;   ESOPHAGOGASTRODUODENOSCOPY N/A 09/01/2014   Procedure: ESOPHAGOGASTRODUODENOSCOPY (EGD);  Surgeon: Lynwood Celestia, MD;  Location: [facility];  Service: Endoscopy;  Laterality: N/A;   IR CV LINE INJECTION  09/13/2023   IR CV LINE INJECTION  12/01/2023   IR IMAGING GUIDED PORT INSERTION  07/06/2023   IR IMAGING GUIDED PORT INSERTION  12/01/2023   IR REMOVAL TUN ACCESS W/ PORT W/O FL MOD SED  12/01/2023   KNEE SURGERY     LAPAROSCOPY     x  4   LESION REMOVAL N/A 01/18/2022   Procedure: EXCISION OF SEBACEOUS CYSTS CHEST AND NECK;  Surgeon: Vanderbilt Ned, MD;  Location: [facility];  Service: General;  Laterality: N/A;   PH IMPEDANCE STUDY N/A 11/04/2015   Procedure: PH IMPEDANCE STUDY;  Surgeon: Lynwood Bohr, MD;  Location: [facility];  Service: Endoscopy;  Laterality: N/A;   VAGINAL HYSTERECTOMY  2010   TVH--ovaries remain   Patient Active Problem List   Diagnosis Date Noted   Sjogren syndrome with keratoconjunctivitis 12/09/2022   Other hemochromatosis 06/10/2021   Elevated LFTs 04/04/2021   Colitis 04/04/2021   Bacterial overgrowth syndrome 05/21/2020   Obesity 05/21/2020   History of endometriosis 05/21/2020    Constipation due to outlet dysfunction 02/05/2020   Gastroesophageal reflux disease 02/05/2020   Obstructive sleep apnea treated with continuous positive airway pressure (CPAP) 06/16/2017   Perennial allergic rhinitis with a nonallergic component 01/31/2017   Asthmatic bronchitis 01/31/2017   GI symptoms 01/31/2017   Family history of colon cancer 01/27/2015   Chest pain 12/13/2012   Dyspnea 12/13/2012   DVT of lower extremity (deep venous thrombosis) (HCC) 01/03/1990    PCP: Elliot Charm, MD   REFERRING PROVIDER: Huey Redell Fallow, PA-C  REFERRING DIAG: M54.2 (ICD-10-CM) - Cervicalgia  THERAPY DIAG:  Muscle weakness (generalized)  Other muscle spasm  Cervicalgia  Rationale for Evaluation and Treatment: Rehabilitation  ONSET DATE: July 2025  SUBJECTIVE:                                                                                                                                                                                                         SUBJECTIVE STATEMENT: Patient presents with her imagining findings. She got her port moved to the left side. The swelling has gone down on the right side. She was able to lay supine yesterday since the surgery.    Eval: Reports onset of L UT pain following port placement into R shoulder Hand dominance: Right  PERTINENT HISTORY:  07/21/2023 Assessment: - suspect left-sided cervical/ trapezius muscle spasm - reassured by absence of red flag signs/ symptoms at this time   Concurrent lymphedema PAIN:  Are you having pain? Yes: NPRS scale: 4/10 Pain location: L UT Pain description: ache, cramp Aggravating factors: activity Relieving factors: heat, medication  PRECAUTIONS: None  RED FLAGS: None     WEIGHT BEARING RESTRICTIONS: No  FALLS:  Has patient fallen in last 6 months? No  OCCUPATION: [employer] Employee  PLOF: Independent  PATIENT GOALS: To manage my neck pain  NEXT MD VISIT:  TBD  OBJECTIVE:  Note: Objective measures were completed at Evaluation unless otherwise noted.  DIAGNOSTIC FINDINGS:  Oct 2025 C3-4 mod bulging disc osteophyte C4-5 mod to severe bulging disc osteophyte C5-6 severe bulging osteophyte C6-7 minimal retrolisthesis of c6 and C7 with severe bulging disc T2-62mod left disc protrusion  subarticular  12/2 MRI lumbar & cervical  MRI cervical spine demonstrates C6-C7 moderately severe left and moderate right foraminal stenosis. Endplate Modic changes are noted. T2-T3 partially visualized moderate left subarticular disc protrusion with impingement upon and probable minimal deformity of the left lateral cord. Likely impinging on the left T3 nerve root.  X-rays of the cervical spine demonstrate cervical spondylosis predominantly at C4-5 C5-6 and C6-7. No subluxation at C1-C2.   X-rays lumbar spine demonstrates a mild dextrorotatory scoliosis. There is disc degeneration L5-S1 remainder obscured by barium.   MRI lumbar spine degenerative spinal listhesis L4-5 with severe facet arthrosis bulging disc mild stenosis. Mild degenerative changes small disc at L5-S1 and L3-4.    PATIENT SURVEYS:  NDI: 28/50 56% perceived disability  Minimum Detectable Change (90% confidence): 5 points or 10% points  10/03/23:  NDI  30%   The Patient-Specific Functional Scale  Initial:  I am going to ask you to identify up to 3 important activities that you are unable to do or are having difficulty with as a result of this problem.  Today are there any activities that you are unable to do or having difficulty with because of this?  (Patient shown scale and patient rated each activity)  Follow up: When you first came in you had difficulty performing these activities.  Today do you still have difficulty?  Patient-Specific activity scoring scheme (Point to one number):  0 1 2 3 4 5 6 7 8 9  10 Unable                                                                                                           Able to perform To perform                                                                                                    activity at the same Activity         Level as before  Injury or problem  Activity       Computer work 1 hour                                     9/30: 5                     11/12  5  2.          Cooking                                                           9/30: 5                                                                                         3.         Sit and watch TV                                              9/30: 5                                                                         POSTURE: rounded shoulders and forward head  PALPATION: Global tenderness throughout B UT and posterior shoulder girdle   CERVICAL ROM:   Active ROM A/PROM (deg) eval 9/30 11/12  Flexion 75%P! 47 50  Extension 75%P! 45 46  Right lateral flexion 75%P! 36 43  Left lateral flexion 75%P! 29 31  Right rotation 75%P! 35 40  Left rotation 75%P! 29 22   (Blank rows = not tested)  UPPER EXTREMITY ROM:  Active ROM Right eval Left eval 9/30 11/12  Shoulder flexion 150 150 L 155  R 151   Shoulder extension      Shoulder abduction 150 150 L 167 R 161 R 150 L 155  Shoulder adduction      Shoulder extension      Shoulder internal rotation      Shoulder external rotation      Elbow flexion      Elbow extension      Wrist flexion      Wrist extension      Wrist ulnar deviation      Wrist radial deviation      Wrist pronation      Wrist supination       (Blank rows = not tested)  UPPER EXTREMITY MMT:  MMT Right eval Left eval 9/30 11/12  Shoulder flexion  Shoulder extension      Shoulder abduction      Shoulder adduction      Shoulder extension      Shoulder internal rotation      Shoulder  external rotation      Middle trapezius   Bil 4-/5 Bil 4/5  Lower trapezius   Bil 4-/5 Bil 4/5  Elbow flexion      Elbow extension      Wrist flexion      Wrist extension      Wrist ulnar deviation      Wrist radial deviation      Wrist pronation      Wrist supination      Grip strength       (Blank rows = not tested)  CERVICAL SPECIAL TESTS:  Neck flexor muscle endurance test: Negative 9/30 decreased cervical extensor strength 4-/5 11/12: deep cervical flexors and exensors 4/5  FUNCTIONAL TESTS:  N/A  TREATMENT  12/06/2023 Lengthy discussion of imaging findings & plan for therapy Manual; patient seated: cervical distraction x 5 10 sec holds ; STM to bilateral upper trap & suboccipitals  Education on benefits of mechanical traction and how it works      11/28/2023 Standing deep cervical nods x 10 at wall Standing deep cervical against purple ball x 10 Standing deep cervical nods on wall with shoulder press against wall (pressing into purple ball) 10x, bil UE elevation 2x5;  Thoracic extension against purple ball x 8 Thoracic rotation with purple ball at back x 5 each direction Lumbar extension at wall x 5  Provided exercises to help with radicular symptoms. Patient verbalized relief.  Manual:  grade 2 cervical distraction 3x 10; gentle upper trap lengthening right/left; suboccipital release; rotation mobs C2-4, lateral side glides C4-7 right/left grade 2/3   11/22/2023 Update on status & upcoming appointments  Seated deep cervical nods Standing deep cervical nods on wall 2 x5 Standing deep cervical nods on wall with shoulder press against wall 10x, bil UE elevation 2x5;  Isometric deep cervical extension against PT hand 2 x 5 5 sec hold Manual:  grade 2 cervical distraction 3x 10; gentle upper trap lengthening right/left; suboccipital release; rotation mobs C2-4, lateral side glides C4-7 right/left grade 2/3     11/15/2023 Discussion of aquatic PT after new port  placement and when cleared to start Discussed benefit of strengthening cervical deep cervical flexors for stabilization Seated deep cervical nods Standing deep cervical nods on wall Standing deep cervical nods on wall with shoulder press against wall 10x, bil UE elevation 5x; green loop around wrists steering wheel turns Facing wall: red band behind head, hands holding band to wall with isometric head press 5 sec hold 10x Cervical ROM as above Shoulder ROM as above Manual:  grade 2 cervical distraction 3x 10; gentle upper trap lengthening right/left; suboccipital release; rotation mobs C2-4, lateral side glides C4-7 right/left grade 2/3   11/08/2023 Update on status and MRI results Hooklying chin tuck x 12 Hooklying scap retraction x 12 Hookyling chin tuck + elbow press down x 12 Hooklying chin tuck + shoulder press down x 12 Decompression series: whole leg lengthening, whole leg press down 5x 5 sec holds Manual: cervical occipital release x 10 5 sec holds and STM to bilateral upper traps    10/31/2023 Discussion of her visit with Dr. Duwayne regarding back pain and his referral to [facility] aquatic [facility] Discussed benefit of strengthening cervical deep cervical flexors for stabilization Supine deep cervical activation  with: UE elevation alternating, elbow press down, shoulder press down 5x (Added to HEP- pt instructions)  Decompression series: whole leg lengthening, whole leg press down 5x 5 sec holds (Added to HEP- pt instructions) Manual:  grade 2 cervical distraction 3x 10; gentle upper trap lengthening right/left; suboccipital release Saunders cervical traction 8# static 3 min  10/25/2023 Discussed results of recent cervical MRI Postural alignment and how that affects load on cervical spine Discussed benefit of strengthening cervical deep cervical flexors for stabilization Standing green band with handles: bil shoulder extension with cue for deep cervical flexion activation Supine  deep cervical nods 5-10 sec holds (Added to HEP- see below) Seated deep cervical activation (Added to HEP- see below) Manual: seated soft tissue mobilization to right > left cervical paraspinals and suboccipitals; grade 2 cervical distraction 3x 10; avoided right upper trap soft tissue mob secondary to blood clot in right port                                                                                                           PATIENT EDUCATION:  Education details: Discussed eval findings, rehab rationale and POC and patient is in agreement  Person educated: Patient Education method: Explanation and Handouts Education comprehension: verbalized understanding and needs further education  HOME EXERCISE PROGRAM: Access Code: [email or web address] URL: [email or web address] Date: 11/28/2023 Prepared by: Kristeen Sar  Exercises - Seated Shoulder Shrugs  - 2 x daily - 5 x weekly - 2 sets - 10 reps - Seated Scapular Retraction  - 2 x daily - 5 x weekly - 2 sets - 10 reps - Gentle Levator Scapulae Stretch  - 1 x daily - 7 x weekly - 1 sets - 2 reps - 30 seconds hold - Seated Cervical Sidebending Stretch  - 1 x daily - 7 x weekly - 1 sets - 2 reps - 30 seconds hold - Quadricep Stretch with Chair and Counter Support  - 1 x daily - 7 x weekly - 2 sets - 20-30s hold - Standing Hamstring Stretch with Step  - 1 x daily - 7 x weekly - 2 sets - 20-30s hold - Seated Hamstring Stretch  - 1 x daily - 7 x weekly - 2 sets - 20-30 hold - Seated Hip Flexor Stretch  - 1 x daily - 7 x weekly - 2 sets - 20-30 hold - Seated Scalene Stretch with Towel  - 1 x daily - 7 x weekly - 2 sets - 20 reps - Seated Modified Small Range Crunch in Chair  - 1 x daily - 7 x weekly - 1-2 sets - 10 reps - Standing Marching  - 1 x daily - 7 x weekly - 1 sets - 10 reps - Median Nerve Flossing - Tray  - 1 x daily - 7 x weekly - 1 sets - 10 reps - Seated Cervical Traction  - 1 x daily - 7 x weekly - 1 sets - 10 reps - Seated  Neck and Traction  - 1 x daily - 7 x weekly - 1  sets - 10 reps - Standing Low Shoulder Row with Anchored Resistance  - 1 x daily - 7 x weekly - 1 sets - 10 reps - Seated Thoracic Flexion and Rotation with Arms Crossed  - 1 x daily - 7 x weekly - 1 sets - 5 reps - Seated Thoracic Lumbar Extension  - 1 x daily - 7 x weekly - 1 sets - 5-10 reps - Supine Head Nod with Deep Neck Flexor Activation  - 1 x daily - 7 x weekly - 1 sets - 10 reps - Seated Deep Neck Flexor Nods  - 1 x daily - 7 x weekly - 1 sets - 10 reps - Standing Lumbar Extension at Wall - Forearms  - 1 x daily - 7 x weekly - 1 sets - 5-8 reps - 2s hold - Lying Prone  - 1 x daily - 7 x weekly - 3-31m hold - Lying Prone with 1 Pillow  - 1 x daily - 7 x weekly - 3-6m hold - Prone Press Up On Elbows  - 1 x daily - 7 x weekly - 1 sets - 5-8 reps - 3-5s hold ASSESSMENT:  CLINICAL IMPRESSION: Kenny came with her MRI results from her cervical and lumbar spine. Majority of the treatment session was spend educating patient on what the results mean using some visuals. Formed a plan of what we will do with adding her lumbar spine. She has to wait two weeks after getting the port placed to safely participate in aquatics. Will plan for patient to continue once a week alternating pool and aquatics appointments. Noted great improvements in upper trap mobility and decreased spasms. Patient is highly motivated and wants to maintain function and manage pain. Since the port has been moved patient does not have any swelling and she does not have a force pulling her forward any more.    OBJECTIVE IMPAIRMENTS: decreased activity tolerance, decreased mobility, decreased ROM, increased fascial restrictions, increased muscle spasms, impaired UE functional use, postural dysfunction, and pain.   ACTIVITY LIMITATIONS: carrying, lifting, bending, bed mobility, and reach over head  PERSONAL FACTORS: Age, Fitness, Past/current experiences, and 1 comorbidity:  lymphedema are also affecting patient's functional outcome.   REHAB POTENTIAL: Good  CLINICAL DECISION MAKING: Stable/uncomplicated  EVALUATION COMPLEXITY: Low   GOALS: Goals reviewed with patient? No    SHORT TERM GOALS=LONG TERM GOALS: Target date: 12/26/2023  Patient to demonstrate independence in HEP  Baseline: [id number] Goal status: ongoing  2.  Patient will acknowledge 4/10 pain at least once during episode of care   Baseline: 8/10 Goal status: ongoing  3.  Patient will score at least 20/50 on NDI to signify clinically meaningful improvement in functional abilities.   Baseline: 28/50 Goal status:  met 9/30  4.  160d AROM in B shoulder flexion/abduction Baseline: 150d Goal status: ongoing  5.  Improved cervical rotation ROM to 45 degrees needed for driving revised Goal status:   6. Be able to walk 1 hour or navigate hills at [facility] new  7. PSFS rating for computer work improved to 6-7 new  8. PSFS rating for cooking improved to 6 new   PLAN:  PT FREQUENCY: 1x/week  PT DURATION: 12 weeks   PLANNED INTERVENTIONS: 97110-Therapeutic exercises, 97530- Therapeutic activity, 97112- Neuromuscular re-education, 97535- Self Care, and 02859- Manual therapy  PLAN FOR NEXT SESSION   flexor strengthening;  postural strengthening; manual techniques as appropriate; pt states Dr. Duwayne gave her a referral for aquatic PT for  lumbar spine but may need to wait until after new port placement   Plan for ortho: once a week alternating land & aquatics appointments to see how she tolerates it once she is cleared to go in the water. Need to do a recert to add lumbar    Kristeen Sar, PT, DPT 12/06/23 1:49 PM

## 2023-12-07 ENCOUNTER — Ambulatory Visit: Admitting: Allergy & Immunology

## 2023-12-07 ENCOUNTER — Encounter: Payer: Self-pay | Admitting: Dermatology

## 2023-12-07 ENCOUNTER — Other Ambulatory Visit: Payer: Self-pay | Admitting: *Deleted

## 2023-12-07 ENCOUNTER — Telehealth: Payer: Self-pay | Admitting: *Deleted

## 2023-12-07 ENCOUNTER — Ambulatory Visit (HOSPITAL_COMMUNITY)
Admission: RE | Admit: 2023-12-07 | Discharge: 2023-12-07 | Disposition: A | Source: Ambulatory Visit | Attending: Allergy & Immunology

## 2023-12-07 ENCOUNTER — Other Ambulatory Visit: Payer: Self-pay

## 2023-12-07 ENCOUNTER — Encounter: Payer: Self-pay | Admitting: Allergy & Immunology

## 2023-12-07 VITALS — BP 128/86 | HR 82 | Temp 98.5°F | Wt 217.7 lb

## 2023-12-07 DIAGNOSIS — D75A Glucose-6-phosphate dehydrogenase (G6PD) deficiency without anemia: Secondary | ICD-10-CM | POA: Diagnosis not present

## 2023-12-07 DIAGNOSIS — E039 Hypothyroidism, unspecified: Secondary | ICD-10-CM

## 2023-12-07 DIAGNOSIS — Z452 Encounter for adjustment and management of vascular access device: Secondary | ICD-10-CM | POA: Diagnosis not present

## 2023-12-07 DIAGNOSIS — R0602 Shortness of breath: Secondary | ICD-10-CM | POA: Diagnosis not present

## 2023-12-07 DIAGNOSIS — Z95828 Presence of other vascular implants and grafts: Secondary | ICD-10-CM

## 2023-12-07 DIAGNOSIS — J4520 Mild intermittent asthma, uncomplicated: Secondary | ICD-10-CM | POA: Diagnosis not present

## 2023-12-07 DIAGNOSIS — R079 Chest pain, unspecified: Secondary | ICD-10-CM

## 2023-12-07 DIAGNOSIS — K6389 Other specified diseases of intestine: Secondary | ICD-10-CM

## 2023-12-07 DIAGNOSIS — R790 Abnormal level of blood mineral: Secondary | ICD-10-CM

## 2023-12-07 DIAGNOSIS — E860 Dehydration: Secondary | ICD-10-CM

## 2023-12-07 NOTE — Patient Instructions (Addendum)
 1. SOB (shortness of breath) - Lung testing actually looks normal today which is good news. - I want to make sure that you do not have a collapsed lung from the port placement, so we are going to get a CXR to check on that.  - This was ordered for [facility].  - You can continue with the albuterol  2 puffs every 4-6 hours.  2. Dysphagia and chronic diarrhea - Continue with the Prevacid  30mg  daily.  - One idea is to start immunoglobulin replacement as an immunomodulatory agent. - But this would be hard to get set up since your labs have been normal.  - I had another patient (not as complicated as you, obviously!) who had chronic diarrhea and it improved when we started her on immunoglobulin, but she also had recurrent infections and inadequate response to Streptococcus pneumoniae on labs).  - Immunoglobulin also increases your risk of blood clots and you are already at high risk of this because of your hemochromatosis.  - I will check on the SIBO testing and the H pylori testing.   3. Recurrent sinusitis  - Continue with Flonase  one spray per nostril daily.  - Your titers were very protective following the vaccination. - We can do a Streptococcal avidity assay in the future if needed. - But overall this seems to be very stable and the least of your worries.   4. G6PD deficiency  - Continue to follow the dietary changes.  - Keep seeing the RD as you are doing.   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  6. Return in about 6 months (around 06/06/2024). You can have the follow up appointment with Dr. Iva or a Nurse Practicioner (our Nurse Practitioners are excellent and always have Physician oversight!).    I am happy to fill out paperwork for short-term disability for you! We should have that done by next week!   Please inform  us  of any Emergency Department visits, hospitalizations, or changes in symptoms. Call us  before going to the ED for breathing or allergy symptoms since we might be able to fit you in for a sick visit. Feel free to contact us  anytime with any questions, problems, or concerns.  It was a pleasure to see you again today!  Websites that have reliable patient information: 1. American Academy of Asthma, Allergy, and Immunology: www.aaaai.org 2. Food Allergy Research and Education (FARE): foodallergy.org 3. Mothers of Asthmatics: http://www.asthmacommunitynetwork.org 4. American College of Allergy, Asthma, and Immunology: www.acaai.org      "Like" us  on Facebook and Instagram for our latest updates!      A healthy democracy works best when [employer] participate! Make sure you are registered to vote! If you have moved or changed any of your contact information, you will need to get this updated before voting! Scan the QR codes below to learn more!

## 2023-12-07 NOTE — Telephone Encounter (Signed)
 Message received from patient stating that she had her port removed on Friday and had a new port replaced as well. Since that time pt states that she has had an allergic reaction to the skin on her chest which has caused redness and pain. Pt states that she had an appt with her allergist today and has an appt with her dermatologist tomorrow and has also spoken with IR about these issues. Pt would like to know if she can come in for lab work because she feels as though her iron level is high.  Appt made for tomorrow for lab work.  Pt is appreciative of call back and has no futher questions at this time.

## 2023-12-07 NOTE — Progress Notes (Signed)
 FOLLOW UP  Date of Service/Encounter:  12/07/23   Assessment:   SOB (shortness of breath) - largely resolved   3mm RML stable lung nodule (benign per imaging guidelines)   Diarrhea - with a history of SIBO s/p treatment by Dr. 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 SOB (shortness of breath) - Lung testing actually looks normal today which is good news. - I want to make sure that you do not have a collapsed lung from the port placement, so we are going to get a CXR to check on that.  - This was ordered for [facility]  - You can continue with the albuterol  2 puffs every 4-6 hours.  2. Dysphagia and chronic diarrhea - Continue with the Prevacid  30mg  daily.  - One idea is to start immunoglobulin replacement as an immunomodulatory agent. - But this would be hard to get set up since your labs have been normal.  - I had another patient (not as complicated as you, obviously!) who had chronic diarrhea and it improved when we started her on immunoglobulin, but she also had recurrent infections and inadequate response to Streptococcus pneumoniae on labs).  - Immunoglobulin also increases your risk of blood clots and you are already at high risk of this because of your hemochromatosis.  - I will check on the SIBO testing and the H pylori testing, but I think we can do that with [facility] here in the office.  - I did spend 45  minutes filling out forms on the same day as the appointment, reviewing the requested timelines and descriptions with you.   3. Recurrent sinusitis  - Continue with Flonase  one spray per nostril daily.  - Your titers were very protective following the vaccination. - We can do a Streptococcal avidity assay in the future if needed. - But overall this seems to be very stable and the least of your worries.   4. G6PD deficiency  - Continue to follow the dietary changes.  - Keep seeing the RD as you are doing.   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  6. Return in about 6 months (around 06/06/2024). You can have the follow up appointment with Dr. Iva or a Nurse Practicioner (our Nurse Practitioners are excellent and always have Physician oversight!).   Total of 60 minutes, greater than 50% of which was spent in discussion of treatment and management options.   Subjective:   Karen [name] [name] is a 53 y.o. female presenting today for follow up of  Chief Complaint  Patient presents with   Shortness of Breath   Recurrent Skin Infections   Perennial allergic rhinitis with a nonallergic component   Follow-up    She is having shortness of breath. Breaking out    Karen [name] [name]  has a history of the following: Patient Active Problem List   Diagnosis Date Noted   Sjogren syndrome with keratoconjunctivitis 12/09/2022   Other hemochromatosis 06/10/2021   Elevated LFTs 04/04/2021   Colitis 04/04/2021   Bacterial overgrowth syndrome 05/21/2020   Obesity 05/21/2020   History of endometriosis 05/21/2020   Constipation due to outlet dysfunction 02/05/2020   Gastroesophageal reflux disease 02/05/2020   Obstructive sleep apnea treated with continuous positive airway pressure (CPAP) 06/16/2017   Perennial allergic rhinitis with a nonallergic  component 01/31/2017   Asthmatic bronchitis 01/31/2017   GI symptoms 01/31/2017   Family history of colon cancer 01/27/2015   Chest pain 12/13/2012   Dyspnea 12/13/2012   DVT of lower extremity (deep venous thrombosis) (HCC) 01/03/1990    History obtained from: chart review and patient.  Discussed the use of AI scribe software for clinical note transcription with the patient and/or guardian, who gave verbal consent to proceed.  [name] is a 53 y.o. female with a complicated past medical history presenting for a follow up visit.  She was last seen in May 2025.  At that time, we did not do lung testing.  We continue with albuterol  as needed.  For her dysphagia, we continued her on Prevacid  30 mg daily.  She did recently have a procedure and biopsies were pending.  For her recurrent sinusitis, we continue with Flonase .  Previous immune workup was normal.  We did talk about doing the streptococcal avidity assay.  We referred her to a dietitian to help her manage her G6PD deficiency.  Her past medical history includes IBS-C with overflow diarrhea, SIBO in 2021, status post hysterectomy, status postcholecystectomy, Schatzki's ring status post dilation in November 2020 and 2023.  She also is heterozygote for hemochromatosis.  Since the last visit, she has been about the same. She continues to have a myriad of other (non allergy/asthma related) problems that have proven difficult to treat and coordinate.   She experienced complications following the placement of a port, initially inserted on July 3rd due to difficulties with phlebotomy related to her hemochromatosis. The first port was placed on the right side but caused burning sensations upon access and swelling in her neck. It was removed last Friday and replaced on the left side. She now experiences pain and a breakout on the left side, which she suspects may be due to an allergic reaction to tape or CHG, despite informing the medical team of her  allergy. She has been taking Tylenol  regularly for pain management.  Asthma/Respiratory Symptom History: She has a history of a benign lung nodule and reports no significant issues with it. Her asthma has been stable, and she uses an inhaler as needed. She has been experiencing shortness of breath since the port procedure, describing it as feeling like she has to 'catch her breath' and pain when taking deep breaths.   Allergic Rhinitis Symptom History: She also reports a sensation of her heart 'up in her throat' when lying down. No recent respiratory infections, but fluid in her ears was noted during a recent primary care visit. She remains on Xyzal  and Flonase  which she uses as needed.   GERD Symptom History: It looks like she saw [facility] on November 21.  At that time, she was continued on MiraLAX  titrated to her bowel movement frequency.  It was also recommended that she do testing for both SIBO and H. pylori.  She continues to alternate between diarrhea and constipation.  She remains on lansoprazole   daily for her GERD. She is managing her health conditions with the help of a dietitian, particularly focusing on weight management and her G6PD deficiency. She is currently on Miralax  and has undergone a recent cleanse. Previous biopsies from an upper GI procedure were normal. She is awaiting a hydrogen breath test for further evaluation. Previous biopsies from an upper GI procedure were normal. She is awaiting a hydrogen breath test for further evaluation.    She has had 8 phlebotomies in the past year.  She is followed by Dr. Timmy.he had a port placed in July due to difficult with accessing her veins. She had another port placed on Friday. She reports that she can tell when her iron levels are out of control. Her last hemoglobin level was 11.  She remains on Humira  for her arthritis. Her rheumatoid arthritis is being managed with Humira , which she started in July. Initially, she had a reaction to the  preservative-containing formulation, so she switched to a preservative-free version, administered bi-weekly. She reports some improvement in joint swelling but continues to experience joint stiffness and swelling in her fingers. She is uncertain if the improvement is due to Humira  or a concurrent steroid injection.   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 She is seeing Dr. Duwayne with [facility].   She continues with pelvic floor PT.   She is currently on [employer] after leaving her job.  Otherwise, there have been no changes to her past medical history, surgical history, family history, or social history.    Review of systems otherwise negative other than that mentioned in the HPI.    Objective:   Blood pressure 128/86, pulse 82, temperature 98.5 F (36.9 C), temperature source Temporal, weight 217 lb 11.2 oz (98.7 kg), SpO2 98%. Body mass index is 34.1 kg/m.    Physical Exam Vitals reviewed.  Constitutional:      Appearance: Normal appearance. She is well-developed. She is not ill-appearing.     Comments: Anxious, but seems more at ease compared to previous visit.   HENT:     Head: Normocephalic and atraumatic.     Right Ear: Tympanic membrane, ear canal and external ear normal. No drainage, swelling or tenderness. Tympanic membrane is not injected, scarred, erythematous, retracted or bulging.     Left Ear: Tympanic membrane, ear canal and external ear normal. No drainage, swelling or tenderness. Tympanic membrane is not injected, scarred, erythematous, retracted or bulging.     Nose: No nasal deformity, septal deviation, mucosal edema or rhinorrhea.     Right Turbinates: Enlarged, swollen and pale.     Left Turbinates: Enlarged, swollen and pale.     Right Sinus: No maxillary sinus tenderness or frontal sinus tenderness.     Left  Sinus: No maxillary sinus tenderness or frontal sinus tenderness.     Comments: No polyps noted.    Mouth/Throat:     Lips: Pink.     Mouth: Mucous membranes are moist. Mucous membranes are not pale and not dry.     Pharynx: Uvula midline.     Comments: Cobblestoning minimally present.  Eyes:     General: Lids are normal. Allergic shiner present.        Right eye: No discharge.        Left eye: No discharge.     Conjunctiva/sclera: Conjunctivae normal.     Right  eye: Right conjunctiva is not injected. No chemosis.    Left eye: Left conjunctiva is not injected. No chemosis.    Pupils: Pupils are equal, round, and reactive to light.  Cardiovascular:     Rate and Rhythm: Normal rate and regular rhythm.     Heart sounds: Normal heart sounds.  Pulmonary:     Effort: Pulmonary effort is normal. No tachypnea, accessory muscle usage or respiratory distress.     Breath sounds: Normal breath sounds. No wheezing, rhonchi or rales.     Comments: Moving air well in all lung fields. No increased work of breathing noted. No wheezing or crackles noted.  Chest:     Chest wall: No tenderness.  Abdominal:     Tenderness: There is no abdominal tenderness. There is no guarding or rebound.  Lymphadenopathy:     Head:     Right side of head: No submandibular, tonsillar or occipital adenopathy.     Left side of head: No submandibular, tonsillar or occipital adenopathy.     Cervical: No cervical adenopathy.  Skin:    General: Skin is warm.     Capillary Refill: Capillary refill takes less than 2 seconds.     Coloration: Skin is not pale.     Findings: No abrasion, erythema, petechiae or rash. Rash is not papular, urticarial or vesicular.     Comments: No eczematous or urticarial lesions noted. Skin is clear.   Neurological:     Mental Status: She is alert.  Psychiatric:        Behavior: Behavior is cooperative.      Diagnostic studies:    Spirometry: results normal (FEV1: 1.93/78%, FVC:  2.21/70%, FEV1/FVC: 87%).    Spirometry consistent with normal pattern.   Allergy Studies: none    Marty Shaggy, MD  [facility] 

## 2023-12-08 ENCOUNTER — Ambulatory Visit: Payer: Self-pay | Admitting: Allergy & Immunology

## 2023-12-08 ENCOUNTER — Inpatient Hospital Stay: Attending: Hematology & Oncology

## 2023-12-08 ENCOUNTER — Encounter: Payer: Self-pay | Admitting: Radiology

## 2023-12-08 ENCOUNTER — Other Ambulatory Visit (HOSPITAL_BASED_OUTPATIENT_CLINIC_OR_DEPARTMENT_OTHER): Payer: Self-pay

## 2023-12-08 ENCOUNTER — Other Ambulatory Visit (HOSPITAL_COMMUNITY): Payer: Self-pay

## 2023-12-08 DIAGNOSIS — Z713 Dietary counseling and surveillance: Secondary | ICD-10-CM | POA: Diagnosis not present

## 2023-12-08 DIAGNOSIS — Z95828 Presence of other vascular implants and grafts: Secondary | ICD-10-CM

## 2023-12-08 DIAGNOSIS — K5900 Constipation, unspecified: Secondary | ICD-10-CM | POA: Diagnosis not present

## 2023-12-08 DIAGNOSIS — E669 Obesity, unspecified: Secondary | ICD-10-CM | POA: Diagnosis not present

## 2023-12-08 DIAGNOSIS — L720 Epidermal cyst: Secondary | ICD-10-CM | POA: Diagnosis not present

## 2023-12-08 DIAGNOSIS — L739 Follicular disorder, unspecified: Secondary | ICD-10-CM | POA: Diagnosis not present

## 2023-12-08 DIAGNOSIS — Z6835 Body mass index (BMI) 35.0-35.9, adult: Secondary | ICD-10-CM | POA: Diagnosis not present

## 2023-12-08 DIAGNOSIS — E860 Dehydration: Secondary | ICD-10-CM

## 2023-12-08 DIAGNOSIS — R079 Chest pain, unspecified: Secondary | ICD-10-CM

## 2023-12-08 DIAGNOSIS — L231 Allergic contact dermatitis due to adhesives: Secondary | ICD-10-CM | POA: Diagnosis not present

## 2023-12-08 DIAGNOSIS — K6389 Other specified diseases of intestine: Secondary | ICD-10-CM

## 2023-12-08 DIAGNOSIS — E039 Hypothyroidism, unspecified: Secondary | ICD-10-CM

## 2023-12-08 DIAGNOSIS — R790 Abnormal level of blood mineral: Secondary | ICD-10-CM

## 2023-12-08 LAB — CBC WITH DIFFERENTIAL (CANCER CENTER ONLY)
Abs Immature Granulocytes: 0.01 K/uL (ref 0.00–0.07)
Basophils Absolute: 0 K/uL (ref 0.0–0.1)
Basophils Relative: 1 %
Eosinophils Absolute: 0.2 K/uL (ref 0.0–0.5)
Eosinophils Relative: 4 %
HCT: 38.2 % (ref 36.0–46.0)
Hemoglobin: 12.3 g/dL (ref 12.0–15.0)
Immature Granulocytes: 0 %
Lymphocytes Relative: 33 %
Lymphs Abs: 1.6 K/uL (ref 0.7–4.0)
MCH: 31.8 pg (ref 26.0–34.0)
MCHC: 32.2 g/dL (ref 30.0–36.0)
MCV: 98.7 fL (ref 80.0–100.0)
Monocytes Absolute: 0.5 K/uL (ref 0.1–1.0)
Monocytes Relative: 11 %
Neutro Abs: 2.5 K/uL (ref 1.7–7.7)
Neutrophils Relative %: 51 %
Platelet Count: 205 K/uL (ref 150–400)
RBC: 3.87 MIL/uL (ref 3.87–5.11)
RDW: 12.7 % (ref 11.5–15.5)
WBC Count: 4.8 K/uL (ref 4.0–10.5)
nRBC: 0 % (ref 0.0–0.2)

## 2023-12-08 LAB — CMP (CANCER CENTER ONLY)
ALT: 34 U/L (ref 0–44)
AST: 26 U/L (ref 15–41)
Albumin: 4.3 g/dL (ref 3.5–5.0)
Alkaline Phosphatase: 132 U/L — ABNORMAL HIGH (ref 38–126)
Anion gap: 9 (ref 5–15)
BUN: 12 mg/dL (ref 6–20)
CO2: 28 mmol/L (ref 22–32)
Calcium: 9.7 mg/dL (ref 8.9–10.3)
Chloride: 102 mmol/L (ref 98–111)
Creatinine: 0.74 mg/dL (ref 0.44–1.00)
GFR, Estimated: >60 mL/min (ref >60)
Glucose, Bld: 86 mg/dL (ref 70–99)
Potassium: 4 mmol/L (ref 3.5–5.1)
Sodium: 139 mmol/L (ref 135–145)
Total Bilirubin: 0.6 mg/dL (ref 0.0–1.2)
Total Protein: 7.4 g/dL (ref 6.5–8.1)

## 2023-12-08 LAB — IRON AND IRON BINDING CAPACITY (CC-WL,HP ONLY)
Iron: 88 ug/dL (ref 28–170)
Saturation Ratios: 24 % (ref 10.4–31.8)
TIBC: 365 ug/dL (ref 250–450)
UIBC: 277 ug/dL

## 2023-12-08 LAB — FERRITIN: Ferritin: 89 ng/mL (ref 11–307)

## 2023-12-08 MED ORDER — CLINDAMYCIN PHOS (TWICE-DAILY) 1 % EX GEL
Freq: Two times a day (BID) | CUTANEOUS | 1 refills | Status: AC | PRN
  Filled 2023-12-08: qty 60, 60d supply, fill #0

## 2023-12-08 MED ORDER — CLOBETASOL PROPIONATE 0.05 % EX OINT
TOPICAL_OINTMENT | Freq: Two times a day (BID) | CUTANEOUS | 1 refills | Status: AC | PRN
  Filled 2023-12-08: qty 60, 60d supply, fill #0

## 2023-12-09 ENCOUNTER — Ambulatory Visit: Payer: Self-pay | Admitting: Hematology & Oncology

## 2023-12-11 ENCOUNTER — Encounter: Payer: Self-pay | Admitting: Allergy & Immunology

## 2023-12-11 ENCOUNTER — Ambulatory Visit: Attending: Surgery | Admitting: Occupational Therapy

## 2023-12-11 DIAGNOSIS — I890 Lymphedema, not elsewhere classified: Secondary | ICD-10-CM | POA: Diagnosis present

## 2023-12-11 DIAGNOSIS — M0600 Rheumatoid arthritis without rheumatoid factor, unspecified site: Secondary | ICD-10-CM | POA: Diagnosis not present

## 2023-12-11 DIAGNOSIS — M51362 Other intervertebral disc degeneration, lumbar region with discogenic back pain and lower extremity pain: Secondary | ICD-10-CM | POA: Diagnosis not present

## 2023-12-11 DIAGNOSIS — M3501 Sicca syndrome with keratoconjunctivitis: Secondary | ICD-10-CM | POA: Diagnosis not present

## 2023-12-11 DIAGNOSIS — M79642 Pain in left hand: Secondary | ICD-10-CM | POA: Diagnosis not present

## 2023-12-11 DIAGNOSIS — M79641 Pain in right hand: Secondary | ICD-10-CM | POA: Diagnosis not present

## 2023-12-11 DIAGNOSIS — M4722 Other spondylosis with radiculopathy, cervical region: Secondary | ICD-10-CM | POA: Diagnosis not present

## 2023-12-11 DIAGNOSIS — Z79899 Other long term (current) drug therapy: Secondary | ICD-10-CM | POA: Diagnosis not present

## 2023-12-11 DIAGNOSIS — E669 Obesity, unspecified: Secondary | ICD-10-CM | POA: Diagnosis not present

## 2023-12-11 DIAGNOSIS — Z6834 Body mass index (BMI) 34.0-34.9, adult: Secondary | ICD-10-CM | POA: Diagnosis not present

## 2023-12-11 NOTE — Therapy (Signed)
 OUTPATIENT OCCUPATIONAL THERAPY TREATMENT NOTE  BLE/ BLQ LYMPHEDEMA  Patient Name: Karen [name] MRN: [id number] DOB:07/05/1970, 53 y.o., female Today's Date: 12/11/2023  REPORTING PERIOD: 10/30/23 -   END OF SESSION:  OT End of Session - 12/11/23 1118     Visit Number 64    Number of Visits 72    Date for Recertification  11/16/23    OT Start Time 1004    OT Stop Time 1104    OT Time Calculation (min) 60 min    Activity Tolerance Patient tolerated treatment well;No increased pain    Behavior During Therapy [facility] for tasks assessed/performed           Past Medical History:  Diagnosis Date   Adenomyosis    Anemia    Arthritis 2013   L knee gets steroid injections    Arthritis, rheumatic, acute or subacute    Asthmatic bronchitis 01/31/2017   Back pain    Chronic constipation    Diarrhea    DVT of lower extremity (deep venous thrombosis) (HCC)    Dysmenorrhea    Endometriosis    Esophageal stricture    G6PD deficiency    GERD (gastroesophageal reflux disease)    History of kidney stones    History of uterine fibroid    IBS (irritable bowel syndrome)    Interstitial cystitis    Lactose intolerance    Migraine    with aura   Other hemochromatosis 06/10/2021   PONV (postoperative nausea and vomiting)    Recurrent upper respiratory infection (URI)    Sebaceous cyst of breast, right lower inner quadrant 10/25/2012   Excised 11/21/12    Sleep apnea    Syncope, non cardiac    Past Surgical History:  Procedure Laterality Date   ARTHROSCOPIC HAGLUNDS REPAIR     BREAST CYST EXCISION Right 11/21/2012   Procedure: CYST EXCISION BREAST;  Surgeon: Sherlean JINNY Laughter, MD;  Location: [facility];  Service: General;  Laterality: Right;   BREAST CYST EXCISION Bilateral 01/18/2022   Procedure: EXCISION OF SEBACEOUS CYST BILATERAL BREAST;  Surgeon: Vanderbilt Ned, MD;  Location: [facility];  Service: General;  Laterality: Bilateral;   BREAST  EXCISIONAL BIOPSY Right 11/2012   CESAREAN SECTION  04/19/1993   CHOLECYSTECTOMY     COLONOSCOPY  05/02/2011   Procedure: COLONOSCOPY;  Surgeon: Lynwood LITTIE Celestia Mickey., MD;  Location: [facility];  Service: Endoscopy;  Laterality: N/A;   colonoscopy  11/04/2014   DENTAL SURGERY  03/06/2018   2 surgeries ( 03/06/2018 and 04/06/2018)   to remove two separate benign tumors   ESOPHAGEAL MANOMETRY N/A 11/04/2015   Procedure: ESOPHAGEAL MANOMETRY (EM);  Surgeon: Lynwood Celestia, MD;  Location: [facility];  Service: Endoscopy;  Laterality: N/A;   ESOPHAGOGASTRODUODENOSCOPY  05/02/2011   Procedure: ESOPHAGOGASTRODUODENOSCOPY (EGD);  Surgeon: Lynwood LITTIE Celestia Mickey., MD;  Location: [facility];  Service: Endoscopy;  Laterality: N/A;   ESOPHAGOGASTRODUODENOSCOPY N/A 09/01/2014   Procedure: ESOPHAGOGASTRODUODENOSCOPY (EGD);  Surgeon: Lynwood Celestia, MD;  Location: [facility];  Service: Endoscopy;  Laterality: N/A;   IR CV LINE INJECTION  09/13/2023   IR CV LINE INJECTION  12/01/2023   IR IMAGING GUIDED PORT INSERTION  07/06/2023   IR IMAGING GUIDED PORT INSERTION  12/01/2023   IR REMOVAL TUN ACCESS W/ PORT W/O FL MOD SED  12/01/2023   KNEE SURGERY     LAPAROSCOPY     x 4   LESION REMOVAL N/A 01/18/2022   Procedure: EXCISION OF SEBACEOUS  CYSTS CHEST AND NECK;  Surgeon: Vanderbilt Ned, MD;  Location: [facility];  Service: General;  Laterality: N/A;   PH IMPEDANCE STUDY N/A 11/04/2015   Procedure: PH IMPEDANCE STUDY;  Surgeon: Lynwood Bohr, MD;  Location: [facility];  Service: Endoscopy;  Laterality: N/A;   VAGINAL HYSTERECTOMY  2010   TVH--ovaries remain   Patient Active Problem List   Diagnosis Date Noted   Sjogren syndrome with keratoconjunctivitis 12/09/2022   Other hemochromatosis 06/10/2021   Elevated LFTs 04/04/2021   Colitis 04/04/2021   Bacterial overgrowth syndrome 05/21/2020   Obesity 05/21/2020   History of endometriosis 05/21/2020   Constipation due to outlet dysfunction  02/05/2020   Gastroesophageal reflux disease 02/05/2020   Obstructive sleep apnea treated with continuous positive airway pressure (CPAP) 06/16/2017   Perennial allergic rhinitis with a nonallergic component 01/31/2017   Asthmatic bronchitis 01/31/2017   GI symptoms 01/31/2017   Family history of colon cancer 01/27/2015   Chest pain 12/13/2012   Dyspnea 12/13/2012   DVT of lower extremity (deep venous thrombosis) (HCC) 01/03/1990   PCP: Gaile New, MD  REFERRING PROVIDER: Valery Ripple, MD  REFERRING DIAG: I89.0  THERAPY DIAG:  Lymphedema, not elsewhere classified  Rationale for Evaluation and Treatment: Rehabilitation  ONSET DATE: ~2014; exacerbation in 2023  SUBJECTIVE:                                                                                                                                                                                          SUBJECTIVE STATEMENT:  Pt presents for OT to address lower quadrant, including deep abdominal lymphatics, and LLE lymphedema w/ associated pain. Pt was last seen on 11/20/23. When asked how she's felt since reducing LE Rx  frequency she states, Im doing OK. Pt denies LE related pain today. She states her recent MRI reveals orthopedic problems in her lower back which may be contributing to the nerve discomfort in her R thigh. She had her port removed and a new port put in place on the L side., She reports this resolved R neck swelling and discomfort she was experiencing.    PERTINENT HISTORY: CVI, OSA (no CPAP), GERD, Esophageal stricture, IBS, Lactose intolerant, Diarrhea, Hx LE DVT, recent RA dx, OA  PAIN:  Are you having pain? Yes: /10- not rated numerically Pain location: abdomen; digestive discomfort Pain description: abdominal fullness, tightness Aggravating factors: standing, walking,  Relieving factors: MLD, Miralax ,   PRECAUTIONS: Other: LYMPHEDEMA; Hx LLE DVT  WEIGHT BEARING RESTRICTIONS: No  FALLS:  Has  patient fallen in last 6 months? No  LIVING ENVIRONMENT: Lives with: lives with spouse and daughter Lives in:  House/apartment Stairs: Yes; Internal: 14 steps; yes and External: 4 steps; yes Has following equipment at home: None  OCCUPATION: [employer] Services Operational Officer, walking, desk work  LEISURE: family time  HAND DOMINANCE: right   PRIOR LEVEL OF FUNCTION: Independent  PATIENT GOALS: Feel better, be able to be more active without pain, wear preferred street shoes. NEW 08/03/23: Be able to manage digestive discomfort and gut inflammation symptoms without medications  OBJECTIVE: Note: Objective measures were completed at Evaluation unless otherwise noted.  COGNITION:  Overall cognitive status: Within functional limits for tasks assessed   POSTURE: WFL  LE ROM: WFL  LE MMT: WFL  LYMPHEDEMA ASSESSMENTS: non-Ca related  INFECTIONS: denies cellulitis and wound hx  BLE COMPARATIVE LIMB VOLUMETRICS Initial 11/15/22  LANDMARK RIGHT (dominant)  R LEG (A-D) 3028.9 ml  R THIGH (E-G) 4809.60 ml  R FULL LIMB (A-G) 7838.5 ml  Limb Volume differential (LVD)  %  Volume change since initial %  Volume change overall V  (Blank rows = not tested)  LANDMARK LEFT  (Rx)  L LEG (A-D) 3189.9 ml  L THIGH (E-G) 4959.8 ml  L FULL LIMB (A-G) 8149.7 ml  Limb Volume differential (LVD)  LEG LVD = 5.32%, L>R; THIGH LVD = 3.12%, L>R; And full LLE LVD = 3.97%, L>R.   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  Skin  Description Hyper-Keratosis Peau d' Orange Shiny Tight Fibrotic/ Indurated Fatty Doughy  Spongy/ boggy       R>L x  x   Skin dry Flaky WNL Macerated   mildly      Color Redness Varicosities Blanching Hemosiderin Stain Mottled        x   Odor Malodorous Yeast Fungal infection  WNL      x   Temperature Warm Cool wnl     x    Pitting Edema   1+ 2+ 3+ 4+ Non-pitting         x   Girth Symmetrical Asymmetrical                   Distribution    L>R toes to groin    Stemmer Sign Positive Negative   +L -R   Lymphorrhea History Of:  Present Absent     x    Wounds History Of Present Absent Venous Arterial Pressure Sheer     x        Signs of Infection Redness Warmth Erythema Acute Swelling Drainage Borders                    Sensation Light Touch Deep pressure Hypersensitivity   In tact Impaired In tact Impaired Absent Impaired   x  x  x     Nails WNL   Fungus nail dystrophy   x     Hair Growth Symmetrical Asymmetrical   x    Skin Creases Base of toes  Ankles   Base of Fingers knees  Abdominal pannus Thigh Lobules  Face/neck   x           GAIT: Distance walked: >500' Assistive device utilized: None Level of assistance: Complete Independence Comments: Functional ambulation and transfers [facility]  LYMPHEDEMA LIFE IMPACT SCALE (LLIS): Initial 11/15/22  50%  FOTO functional outcome measure: Deferred. FOTO discontinued.  TODAY'S TREATMENT:                                                                                                                                         MLD to abdominal lymphatics Pt edu for LE self care  PATIENT EDUCATION:  Continued Pt/ CG edu for how function of cisterna chyli, part of the thoracic duct of deep abdominal lymphatics, assists with digestion by filtering many of the fats and proteins from the digestive system. Sub optimal lymphatic flow in this region may result in constipation, bloating, swelling, etc. MLD helps mobilize these protein and fats , and the rhythmic movement of manual lymphatic drainage  stimulates digestive muscles and increase peristalsis. Provided printed resource for reference.  Topics include outcome of comparative limb volumetrics- starting limb volume differentials (LVDs), technology and gradient techniques used for short stretch, multilayer compression wrapping, simple self-MLD, therapeutic lymphatic pumping exercises, skin/nail care, LE precautions,. compression garment recommendations and specifications, wear and care schedule and compression garment donning / doffing w assistive devices. Discussed progress towards all OT goals since commencing CDT. All questions answered to the Pt's satisfaction. Good return. Person educated: Patient  Education method: Explanation, Demonstration, and Handouts Education comprehension: verbalized understanding, returned demonstration, verbal cues required, and needs further education  [facility] PRECAUTIONS: Pt education re precautions related to use of advanced sequential pneumatic compression device. Pt verbalized understanding that she should never use device on 2 arms/legs simultaneously and should not use device 2 x on same day to avoid overloading her heart with fluid volume return both at present, and in years to come should her medical condition change. Pt instructed to remove device immediately should she experience atypical SOP, light headedness, or acute pain. Pt instructed to discontinue pump if she suspects, or has any infection, blood clot, cellulitis, the flu, corona virus, etc.   HOME EXERCISE PROGRAM: BLE lymphatic pumping there ex using- 1 sets of 10 reps, each exercise in order-  1-2 x daily, bilaterally Simple self MLD 1 x daily Daily skin care to increase hydration, skin mobility and decrease infection risk- can be done during MLD Compression wraps 23/7 during intensive phase of CDT Compression garments/ devices during self-management phase of CDT  ASSESSMENT:  CLINICAL IMPRESSION: Pt tolerated deep abdominal MLD with  very gentle short neck sequence.  Slight R lateral neck swelling is resolved. Pt continues to have digestive discomfort associated with chronic constipation alternating with diarrhea.  Pt tolerated the abdominal sequence as established without increased pain. Pt continues to  benefit from MLD for enhancing gut motility and  bowel function.  MLD is also known to facilitate the removal of inflammatory substances and toxins from the intestinal area, which may help reduce inflammation linked to digestive disorders. Cont  1 x weekly through next visit, then decrease frequency to every other week for one month. Continue family training with Pt's daughter for simple MLD at home between sessions. Pt has obtained recommended advanced, sequential, compression device (Flexitouch) and reports she is using it several  times per week. Pt in agreement with this plan to continue OT at reduced frequency.    (10/30/23 OT Progress Note: Pls review GOALS section below for additional details regarding progress towards goals. Pt understands and can apply lymphedema precautions after skilled training. Pt remains 100% compliant with lymphedema self care. Because lymphatic massage to one's own deep abdomen is challenging, especially with orthopedic issues Pt has had this year, Pt's daughter is learning to assist her mom with simple self MLD. Pt has also undergone a trial of the advanced Flexitouch PLUS sequential pneumatic compression device which features an abdominal, truncal, buttocks component above the level of the inguinal LN to facilitate fluid congestion towards the thoracic duct and terminus. Pt felt this helpful to her during the trial and she's hoping for insurance approval. Pt has obtained the recommended knee length compression garment and wears it for prolonged walking and dependent positioning. Limb volume measurement in the lower extremity for comparison was not performed today due to time constraints, but by visual  assessment it appears unchanged. Abdominal swelling and tightness is also unchanged by visual assessment and palpation. Pt has reached a clinical plateau and he agrees with plan to taper treatment to transition to LE self management phase of CDT. After her next session we'll reduce OT for every other week for a couple of weeks, then further reduce PRN.   (11/15/22 Initial Evaluation: Michaelah Credeur is a 53 y.o. female presenting with very mild, stage II, LLE lymphedema 2/2 venous insufficiency and obesity. L Leg swelling and associated pain swelling fluctuates. It has progressed over time, and now no longer resolves with elevation over night. RLE swelling limits balance during functional ambulation. It exacerbates infection and falls risk. LLE/RLQ lymphedema interferes with functional performance in all occupational domains, including basic and instrumental ADLs, productive activities, leisure pursuits and social participation. Pt will benefit from Occupational Therapy for Complete Decongestive Therapy (CDT) to restore function, to reduce physical and psychologic suffering associated with chronic, progressive lymphedema and associated pain, and to limit infection. CDT will include manual lymphatic drainage (MLD), skin care, therapeutic exercise and compression wraps and garment's devices. Without skilled OT for lymphedema care the condition will worsen and further functional decline is expected  Custom-made gradient compression garments and HOS devices are medically necessary because they are uniquely sized and shaped to fit the exact dimensions of the affected extremities, and to provide appropriate medical grade, graduated compression essential for optimally managing chronic, progressive lymphedema. Multiple custom compression garments are needed to ensure proper hygiene to limit infection risk. Custom compression garments should be replaced q 3-6 months When worn consistently for optimal lipo-lymphedema  self-management over time. HOS devices, medically necessary to limit fibrosis buildup in tissue, should be replaced q 2 years and PRN when worn out.      OBJECTIVE IMPAIRMENTS: decreased activity tolerance, decreased knowledge of condition, decreased knowledge of use of DME, increased edema, impaired sensation, pain, and chronic, progressive leg swelling.   ACTIVITY LIMITATIONS: dependent sitting, extended standing and/ or walking, squatting, and lower body  dressing, fitting preferred street shoes  PARTICIPATION LIMITATIONS: shopping, community activity, occupation, and yard work  PERSONAL FACTORS: 3+ comorbidities: Hx DVT,  OSA, CVI. Varicose veins are also affecting patient's functional outcome.   REHAB POTENTIAL: Good  EVALUATION COMPLEXITY: Moderate   GOALS: Goals reviewed with patient? Yes  SHORT TERM GOALS: Target date: 4th OT Rx visit  SHORT TERM GOALS: Target date: 4th OT Rx visit   Pt will demonstrate understanding of lymphedema precautions and prevention strategies with modified independence using a printed reference to identify at least 5 precautions and discussing how s/he may implement them into daily life to reduce risk of progression with extra time. Baseline:Max A Goal status: GOAL MET   Pt will be able to apply multilayer, knee length, gradient, compression wraps to one leg at a time with modified assistance (extra time and assistive device/s) to decrease limb volume, to limit infection risk, and to limit lymphedema progression.  Baseline: Dependent Goal status: GOAL DISCHARGED. Pt Going to compression garments instead  LONG TERM GOALS: Target date: 02/08/23  Given this patient's Intake score of 67%/100% on the functional outcomes FOTO tool, patient will experience an increase in function of 5 points to improve basic and instrumental ADLs performance, including lymphedema self-care.  Baseline: Max A Goal status:GOAL DISCHARGED.  FOTO TOOL DISCONTINUED AT CLINIC    Given this patient's Intake score of 50% on the Lymphedema Life Impact Scale (LLIS), patient will experience a reduction of at least 5 points in her perceived level of functional impairment resulting from lymphedema to improve functional performance and quality of life (QOL). Baseline: 50% Goal status: PROGRESSING  Pt will achieve at least a 10% volume reduction in B legs to return limb to typical size and shape, to limit infection risk and LE progression, to decrease pain, to improve function. Baseline: Dependent Goal status: PROGRESSING  4.  Pt will obtain proper compression garments/devices and achieve modified independence (extra time + assistive devices) with donning/doffing to optimize limb volume reductions and limit LE  progression over time. Baseline:  Goal status:GOAL MET  5.  During Intensive phase CDT , with modified independence, Pt will achieve at least 85% compliance with all lymphedema self-care home program components, including daily skin care, compression wraps and /or garments, simple self MLD and lymphatic pumping therex to habituate LE self care protocol  into ADLs for optimal LE self-management over time. Baseline: Dependent Goal status:GOAL MET   PLAN:  OT FREQUENCY: 1 /week  OT DURATION: 12 weeks and PRN  PLANNED INTERVENTIONS: 97110-Therapeutic exercises, 97530- Therapeutic activity, 97535- Self Care, [id number]- Manual therapy, Patient/Family education, Manual lymph drainage, Compression bandaging, and fit with appropriate compression garments  PLAN FOR NEXT SESSION:  MLD as established Pt and family edu for LE self-care  Zebedee Dec, MS, OTR/L, CLT-LANA 12/11/23 11:31 AM

## 2023-12-11 NOTE — Telephone Encounter (Signed)
 Thanks for following up.  In my response to Makaylin on Thursday I already gave her my plan of action (follow up with ED to rule out any serious complicaiton from the port placement such as infection or collapsed lung) and she was to apply hydrocortisone  to the itchy areas of the rash BID for up to 10 days.  She can upload the photos to her chart but its not going to change my plan.  Next time you message her ask if she follow up at the ER as requested by both me and Dr Iva last week.  Thanks

## 2023-12-12 DIAGNOSIS — H524 Presbyopia: Secondary | ICD-10-CM | POA: Diagnosis not present

## 2023-12-12 NOTE — Telephone Encounter (Signed)
 I had called pt back about her husband who is a patient here. She states her ophthalmologist told her everything checked out. She does have hemachromatosis and has a phlebotomy appt this week. She said that she is planning to call back to schedule herself an appt here for migraine follow-up. Denies new symptoms just wants to discuss med management. Told her she can see any NP for this. She thanked me for the call.

## 2023-12-12 NOTE — Telephone Encounter (Signed)
 HI Karen [name], I reviewed the photo and it does not look infected.  Did she ever get the chest x-ray? I still recommend she apply Hydrocortisone  ointment twice a day for 10 days on 10 days off for the itch.  Thanks!

## 2023-12-13 ENCOUNTER — Encounter: Payer: Self-pay | Admitting: Physical Therapy

## 2023-12-13 ENCOUNTER — Ambulatory Visit: Admitting: Physical Therapy

## 2023-12-13 DIAGNOSIS — M542 Cervicalgia: Secondary | ICD-10-CM

## 2023-12-13 DIAGNOSIS — M62838 Other muscle spasm: Secondary | ICD-10-CM

## 2023-12-13 DIAGNOSIS — M6281 Muscle weakness (generalized): Secondary | ICD-10-CM | POA: Diagnosis not present

## 2023-12-13 NOTE — Therapy (Signed)
 OUTPATIENT PHYSICAL THERAPY CERVICAL TREATMENT   Patient Name: Karen [name] MRN: [id number] DOB:1970/01/09, 53 y.o., female Today's Date: 12/13/2023  END OF SESSION:  PT End of Session - 12/13/23 1239     Visit Number 21    Date for Recertification  12/26/23    Authorization Type [name]    PT Start Time 1238    PT Stop Time 1315    PT Time Calculation (min) 37 min    Activity Tolerance Patient tolerated treatment well                      Past Medical History:  Diagnosis Date   Adenomyosis    Anemia    Arthritis 2013   L knee gets steroid injections    Arthritis, rheumatic, acute or subacute    Asthmatic bronchitis 01/31/2017   Back pain    Chronic constipation    Diarrhea    DVT of lower extremity (deep venous thrombosis) (HCC)    Dysmenorrhea    Endometriosis    Esophageal stricture    G6PD deficiency    GERD (gastroesophageal reflux disease)    History of kidney stones    History of uterine fibroid    IBS (irritable bowel syndrome)    Interstitial cystitis    Lactose intolerance    Migraine    with aura   Other hemochromatosis 06/10/2021   PONV (postoperative nausea and vomiting)    Recurrent upper respiratory infection (URI)    Sebaceous cyst of breast, right lower inner quadrant 10/25/2012   Excised 11/21/12    Sleep apnea    Syncope, non cardiac    Past Surgical History:  Procedure Laterality Date   ARTHROSCOPIC HAGLUNDS REPAIR     BREAST CYST EXCISION Right 11/21/2012   Procedure: CYST EXCISION BREAST;  Surgeon: Sherlean JINNY Laughter, MD;  Location: [facility];  Service: General;  Laterality: Right;   BREAST CYST EXCISION Bilateral 01/18/2022   Procedure: EXCISION OF SEBACEOUS CYST BILATERAL BREAST;  Surgeon: Vanderbilt Ned, MD;  Location: [facility];  Service: General;  Laterality: Bilateral;   BREAST EXCISIONAL BIOPSY Right 11/2012   CESAREAN SECTION  04/19/1993   CHOLECYSTECTOMY      COLONOSCOPY  05/02/2011   Procedure: COLONOSCOPY;  Surgeon: Lynwood LITTIE Celestia Mickey., MD;  Location: [facility];  Service: Endoscopy;  Laterality: N/A;   colonoscopy  11/04/2014   DENTAL SURGERY  03/06/2018   2 surgeries ( 03/06/2018 and 04/06/2018)   to remove two separate benign tumors   ESOPHAGEAL MANOMETRY N/A 11/04/2015   Procedure: ESOPHAGEAL MANOMETRY (EM);  Surgeon: Lynwood Celestia, MD;  Location: [facility];  Service: Endoscopy;  Laterality: N/A;   ESOPHAGOGASTRODUODENOSCOPY  05/02/2011   Procedure: ESOPHAGOGASTRODUODENOSCOPY (EGD);  Surgeon: Lynwood LITTIE Celestia Mickey., MD;  Location: [facility];  Service: Endoscopy;  Laterality: N/A;   ESOPHAGOGASTRODUODENOSCOPY N/A 09/01/2014   Procedure: ESOPHAGOGASTRODUODENOSCOPY (EGD);  Surgeon: Lynwood Celestia, MD;  Location: [facility];  Service: Endoscopy;  Laterality: N/A;   IR CV LINE INJECTION  09/13/2023   IR CV LINE INJECTION  12/01/2023   IR IMAGING GUIDED PORT INSERTION  07/06/2023   IR IMAGING GUIDED PORT INSERTION  12/01/2023   IR REMOVAL TUN ACCESS W/ PORT W/O FL MOD SED  12/01/2023   KNEE SURGERY     LAPAROSCOPY     x 4   LESION REMOVAL N/A 01/18/2022   Procedure: EXCISION OF SEBACEOUS CYSTS CHEST AND NECK;  Surgeon: Vanderbilt Ned, MD;  Location: [facility];  Service: General;  Laterality: N/A;   PH IMPEDANCE STUDY N/A 11/04/2015   Procedure: PH IMPEDANCE STUDY;  Surgeon: Lynwood Bohr, MD;  Location: [facility];  Service: Endoscopy;  Laterality: N/A;   VAGINAL HYSTERECTOMY  2010   TVH--ovaries remain   Patient Active Problem List   Diagnosis Date Noted   Sjogren syndrome with keratoconjunctivitis 12/09/2022   Other hemochromatosis 06/10/2021   Elevated LFTs 04/04/2021   Colitis 04/04/2021   Bacterial overgrowth syndrome 05/21/2020   Obesity 05/21/2020   History of endometriosis 05/21/2020   Constipation due to outlet dysfunction 02/05/2020   Gastroesophageal reflux disease 02/05/2020   Obstructive sleep apnea  treated with continuous positive airway pressure (CPAP) 06/16/2017   Perennial allergic rhinitis with a nonallergic component 01/31/2017   Asthmatic bronchitis 01/31/2017   GI symptoms 01/31/2017   Family history of colon cancer 01/27/2015   Chest pain 12/13/2012   Dyspnea 12/13/2012   DVT of lower extremity (deep venous thrombosis) (HCC) 01/03/1990    PCP: Elliot Charm, MD   REFERRING PROVIDER: Huey Redell Fallow, PA-C  REFERRING DIAG: M54.2 (ICD-10-CM) - Cervicalgia  THERAPY DIAG:  Cervicalgia  Muscle weakness (generalized)  Other muscle spasm  Rationale for Evaluation and Treatment: Rehabilitation  ONSET DATE: July 2025  SUBJECTIVE:                                                                                                                                                                                                         SUBJECTIVE STATEMENT: Had a skin reaction from adhesive/glue from port change. My neck is starting to feel better, no neck swelling since moved port.  Having a facet injection for lumbar next week.   Has a new order for aquatic PT to include LBP with cervical pain from Dr. Duwayne (will start in Jan when recovered from port placement/allergic reaction)   Eval: Reports onset of L UT pain following port placement into R shoulder Hand dominance: Right  PERTINENT HISTORY:  07/21/2023 Assessment: - suspect left-sided cervical/ trapezius muscle spasm - reassured by absence of red flag signs/ symptoms at this time   Concurrent lymphedema PAIN:  Are you having pain? Yes: NPRS scale: 2/10 Pain location: L UT Pain description: ache, cramp Aggravating factors: activity Relieving factors: heat, medication  PRECAUTIONS: None  RED FLAGS: None     WEIGHT BEARING RESTRICTIONS: No  FALLS:  Has patient fallen in last 6 months? No  OCCUPATION: [employer] Employee  PLOF: Independent  PATIENT GOALS: To manage my neck pain  NEXT MD  VISIT: TBD  OBJECTIVE:  Note: Objective measures were completed at Evaluation unless otherwise noted.  DIAGNOSTIC FINDINGS:  Oct 2025 C3-4 mod bulging disc osteophyte C4-5 mod to severe bulging disc osteophyte C5-6 severe bulging osteophyte C6-7 minimal retrolisthesis of c6 and C7 with severe bulging disc T2-50mod left disc protrusion  subarticular  12/2 MRI lumbar & cervical  MRI cervical spine demonstrates C6-C7 moderately severe left and moderate right foraminal stenosis. Endplate Modic changes are noted. T2-T3 partially visualized moderate left subarticular disc protrusion with impingement upon and probable minimal deformity of the left lateral cord. Likely impinging on the left T3 nerve root.  X-rays of the cervical spine demonstrate cervical spondylosis predominantly at C4-5 C5-6 and C6-7. No subluxation at C1-C2.   X-rays lumbar spine demonstrates a mild dextrorotatory scoliosis. There is disc degeneration L5-S1 remainder obscured by barium.   MRI lumbar spine degenerative spinal listhesis L4-5 with severe facet arthrosis bulging disc mild stenosis. Mild degenerative changes small disc at L5-S1 and L3-4.    PATIENT SURVEYS:  NDI: 28/50 56% perceived disability  Minimum Detectable Change (90% confidence): 5 points or 10% points  10/03/23:  NDI  30%   The Patient-Specific Functional Scale  Initial:  I am going to ask you to identify up to 3 important activities that you are unable to do or are having difficulty with as a result of this problem.  Today are there any activities that you are unable to do or having difficulty with because of this?  (Patient shown scale and patient rated each activity)  Follow up: When you first came in you had difficulty performing these activities.  Today do you still have difficulty?  Patient-Specific activity scoring scheme (Point to one number):  0 1 2 3 4 5 6 7 8 9  10 Unable                                                                                                           Able to perform To perform                                                                                                    activity at the same Activity         Level as before  Injury or problem  Activity       Computer work 1 hour                                     9/30: 5                     11/12  5  2.          Cooking                                                           9/30: 5                                                                                         3.         Sit and watch TV                                              9/30: 5                                                                         POSTURE: rounded shoulders and forward head  PALPATION: Global tenderness throughout B UT and posterior shoulder girdle   CERVICAL ROM:   Active ROM A/PROM (deg) eval 9/30 11/12  Flexion 75%P! 47 50  Extension 75%P! 45 46  Right lateral flexion 75%P! 36 43  Left lateral flexion 75%P! 29 31  Right rotation 75%P! 35 40  Left rotation 75%P! 29 22   (Blank rows = not tested)  UPPER EXTREMITY ROM:  Active ROM Right eval Left eval 9/30 11/12  Shoulder flexion 150 150 L 155  R 151   Shoulder extension      Shoulder abduction 150 150 L 167 R 161 R 150 L 155  Shoulder adduction      Shoulder extension      Shoulder internal rotation      Shoulder external rotation      Elbow flexion      Elbow extension      Wrist flexion      Wrist extension      Wrist ulnar deviation      Wrist radial deviation      Wrist pronation      Wrist supination       (Blank rows = not tested)  UPPER EXTREMITY MMT:  MMT Right eval Left eval 9/30 11/12  Shoulder flexion  Shoulder extension      Shoulder abduction      Shoulder adduction      Shoulder extension      Shoulder internal rotation       Shoulder external rotation      Middle trapezius   Bil 4-/5 Bil 4/5  Lower trapezius   Bil 4-/5 Bil 4/5  Elbow flexion      Elbow extension      Wrist flexion      Wrist extension      Wrist ulnar deviation      Wrist radial deviation      Wrist pronation      Wrist supination      Grip strength       (Blank rows = not tested)  CERVICAL SPECIAL TESTS:  Neck flexor muscle endurance test: Negative 9/30 decreased cervical extensor strength 4-/5 11/12: deep cervical flexors and exensors 4/5  FUNCTIONAL TESTS:  N/A  TREATMENT  12/13/2023 Discussion of status including new port an allergic reaction Plan to do ERO 12/23 (pt has new referral from Dr. Duwayne to include Low back pain and initiation of aquatic PT) Body mechanics with standing: foot prop  Hip hinge with dowel and 3 points of contact and neutral spine alignment10x Hip hinge for functional movement to retrieve pots/pans and pick up objects from the floor Manual:  grade 2 cervical distraction 3x 10; gentle upper trap lengthening right/left; suboccipital release; rotation mobs C2-4, lateral side glides C4-7 right/left grade 2/3; upper cervical flexion mob grade 2/3 10x  12/06/2023 Lengthy discussion of imaging findings & plan for therapy Manual; patient seated: cervical distraction x 5 10 sec holds ; STM to bilateral upper trap & suboccipitals  Education on benefits of mechanical traction and how it works      11/28/2023 Standing deep cervical nods x 10 at wall Standing deep cervical against purple ball x 10 Standing deep cervical nods on wall with shoulder press against wall (pressing into purple ball) 10x, bil UE elevation 2x5;  Thoracic extension against purple ball x 8 Thoracic rotation with purple ball at back x 5 each direction Lumbar extension at wall x 5  Provided exercises to help with radicular symptoms. Patient verbalized relief.  Manual:  grade 2 cervical distraction 3x 10; gentle upper trap lengthening  right/left; suboccipital release; rotation mobs C2-4, lateral side glides C4-7 right/left grade 2/3   11/22/2023 Update on status & upcoming appointments  Seated deep cervical nods Standing deep cervical nods on wall 2 x5 Standing deep cervical nods on wall with shoulder press against wall 10x, bil UE elevation 2x5;  Isometric deep cervical extension against PT hand 2 x 5 5 sec hold Manual:  grade 2 cervical distraction 3x 10; gentle upper trap lengthening right/left; suboccipital release; rotation mobs C2-4, lateral side glides C4-7 right/left grade 2/3     11/15/2023 Discussion of aquatic PT after new port placement and when cleared to start Discussed benefit of strengthening cervical deep cervical flexors for stabilization Seated deep cervical nods Standing deep cervical nods on wall Standing deep cervical nods on wall with shoulder press against wall 10x, bil UE elevation 5x; green loop around wrists steering wheel turns Facing wall: red band behind head, hands holding band to wall with isometric head press 5 sec hold 10x Cervical ROM as above Shoulder ROM as above Manual:  grade 2 cervical distraction 3x 10; gentle upper trap lengthening right/left; suboccipital release; rotation mobs C2-4, lateral side glides C4-7 right/left grade 2/3  11/08/2023 Update on status and MRI results Hooklying chin tuck x 12 Hooklying scap retraction x 12 Hookyling chin tuck + elbow press down x 12 Hooklying chin tuck + shoulder press down x 12 Decompression series: whole leg lengthening, whole leg press down 5x 5 sec holds Manual: cervical occipital release x 10 5 sec holds and STM to bilateral upper traps    10/31/2023 Discussion of her visit with Dr. Duwayne regarding back pain and his referral to [facility] aquatic [facility] Discussed benefit of strengthening cervical deep cervical flexors for stabilization Supine deep cervical activation with: UE elevation alternating, elbow press down, shoulder  press down 5x (Added to HEP- pt instructions)  Decompression series: whole leg lengthening, whole leg press down 5x 5 sec holds (Added to HEP- pt instructions) Manual:  grade 2 cervical distraction 3x 10; gentle upper trap lengthening right/left; suboccipital release Saunders cervical traction 8# static 3 min  10/25/2023 Discussed results of recent cervical MRI Postural alignment and how that affects load on cervical spine Discussed benefit of strengthening cervical deep cervical flexors for stabilization Standing green band with handles: bil shoulder extension with cue for deep cervical flexion activation Supine deep cervical nods 5-10 sec holds (Added to HEP- see below) Seated deep cervical activation (Added to HEP- see below) Manual: seated soft tissue mobilization to right > left cervical paraspinals and suboccipitals; grade 2 cervical distraction 3x 10; avoided right upper trap soft tissue mob secondary to blood clot in right port                                                                                                           PATIENT EDUCATION:  Education details: Discussed eval findings, rehab rationale and POC and patient is in agreement  Person educated: Patient Education method: Explanation and Handouts Education comprehension: verbalized understanding and needs further education  HOME EXERCISE PROGRAM: Access Code: [email or web address] URL: [email or web address] Date: 11/28/2023 Prepared by: Kristeen Sar  Exercises - Seated Shoulder Shrugs  - 2 x daily - 5 x weekly - 2 sets - 10 reps - Seated Scapular Retraction  - 2 x daily - 5 x weekly - 2 sets - 10 reps - Gentle Levator Scapulae Stretch  - 1 x daily - 7 x weekly - 1 sets - 2 reps - 30 seconds hold - Seated Cervical Sidebending Stretch  - 1 x daily - 7 x weekly - 1 sets - 2 reps - 30 seconds hold - Quadricep Stretch with Chair and Counter Support  - 1 x daily - 7 x weekly - 2 sets - 20-30s hold - Standing  Hamstring Stretch with Step  - 1 x daily - 7 x weekly - 2 sets - 20-30s hold - Seated Hamstring Stretch  - 1 x daily - 7 x weekly - 2 sets - 20-30 hold - Seated Hip Flexor Stretch  - 1 x daily - 7 x weekly - 2 sets - 20-30 hold - Seated Scalene Stretch with Towel  - 1 x daily - 7 x weekly - 2 sets - 20  reps - Seated Modified Small Range Crunch in Chair  - 1 x daily - 7 x weekly - 1-2 sets - 10 reps - Standing Marching  - 1 x daily - 7 x weekly - 1 sets - 10 reps - Median Nerve Flossing - Tray  - 1 x daily - 7 x weekly - 1 sets - 10 reps - Seated Cervical Traction  - 1 x daily - 7 x weekly - 1 sets - 10 reps - Seated Neck and Traction  - 1 x daily - 7 x weekly - 1 sets - 10 reps - Standing Low Shoulder Row with Anchored Resistance  - 1 x daily - 7 x weekly - 1 sets - 10 reps - Seated Thoracic Flexion and Rotation with Arms Crossed  - 1 x daily - 7 x weekly - 1 sets - 5 reps - Seated Thoracic Lumbar Extension  - 1 x daily - 7 x weekly - 1 sets - 5-10 reps - Supine Head Nod with Deep Neck Flexor Activation  - 1 x daily - 7 x weekly - 1 sets - 10 reps - Seated Deep Neck Flexor Nods  - 1 x daily - 7 x weekly - 1 sets - 10 reps - Standing Lumbar Extension at Wall - Forearms  - 1 x daily - 7 x weekly - 1 sets - 5-8 reps - 2s hold - Lying Prone  - 1 x daily - 7 x weekly - 3-24m hold - Lying Prone with 1 Pillow  - 1 x daily - 7 x weekly - 3-38m hold - Prone Press Up On Elbows  - 1 x daily - 7 x weekly - 1 sets - 5-8 reps - 3-5s hold ASSESSMENT:  CLINICAL IMPRESSION: Instruction in body mechanics with standing for cooking and with bending and stooping using the hip hinge method.   Verbal cues for forward gaze needed for neutral spine alignment.   She responds well to manual therapy with improving joint and soft tissue mobility.  Therapist monitoring response to all interventions and modifying treatment accordingly.       OBJECTIVE IMPAIRMENTS: decreased activity tolerance, decreased mobility,  decreased ROM, increased fascial restrictions, increased muscle spasms, impaired UE functional use, postural dysfunction, and pain.   ACTIVITY LIMITATIONS: carrying, lifting, bending, bed mobility, and reach over head  PERSONAL FACTORS: Age, Fitness, Past/current experiences, and 1 comorbidity: lymphedema are also affecting patient's functional outcome.   REHAB POTENTIAL: Good  CLINICAL DECISION MAKING: Stable/uncomplicated  EVALUATION COMPLEXITY: Low   GOALS: Goals reviewed with patient? No    SHORT TERM GOALS=LONG TERM GOALS: Target date: 12/26/2023  Patient to demonstrate independence in HEP  Baseline: [email or web address] Goal status: ongoing  2.  Patient will acknowledge 4/10 pain at least once during episode of care   Baseline: 8/10 Goal status: ongoing  3.  Patient will score at least 20/50 on NDI to signify clinically meaningful improvement in functional abilities.   Baseline: 28/50 Goal status:  met 9/30  4.  160d AROM in B shoulder flexion/abduction Baseline: 150d Goal status: ongoing  5.  Improved cervical rotation ROM to 45 degrees needed for driving revised Goal status:   6. Be able to walk 1 hour or navigate hills at [facility] new  7. PSFS rating for computer work improved to 6-7 new  8. PSFS rating for cooking improved to 6 new   PLAN:  PT FREQUENCY: 1x/week  PT DURATION: 12 weeks   PLANNED INTERVENTIONS: 97110-Therapeutic exercises, 97530- Therapeutic  activity, [id number]- Neuromuscular re-education, 97535- Self Care, and 02859- Manual therapy  PLAN FOR NEXT SESSION   flexor strengthening;  postural strengthening; manual techniques as appropriate Plan for ortho: once a week alternating land & aquatics appointments to see how she tolerates it once she is cleared to go in the water. Need to do a recert to add lumbar 12/23  Glade Pesa, PT 12/13/23 4:05 PM Phone: [phone] Fax: [phone]

## 2023-12-13 NOTE — Telephone Encounter (Signed)
 HI Karen [name], If the temovate  is a cream it will likely burn.  If she's like a new rx for clobetasol  (generic for temovate ) ointment we can send one over.  it is less likely to burn but I can't guarantee that.   It would be applied twice a day for 10 days then she will stop.  She should avoid any adhesive bandages in order to prevent recurrence.

## 2023-12-14 ENCOUNTER — Ambulatory Visit: Payer: Self-pay

## 2023-12-14 ENCOUNTER — Inpatient Hospital Stay

## 2023-12-14 DIAGNOSIS — M62838 Other muscle spasm: Secondary | ICD-10-CM

## 2023-12-14 DIAGNOSIS — M6281 Muscle weakness (generalized): Secondary | ICD-10-CM

## 2023-12-14 DIAGNOSIS — R279 Unspecified lack of coordination: Secondary | ICD-10-CM

## 2023-12-14 DIAGNOSIS — R102 Pelvic and perineal pain unspecified side: Secondary | ICD-10-CM

## 2023-12-14 DIAGNOSIS — M5459 Other low back pain: Secondary | ICD-10-CM

## 2023-12-14 DIAGNOSIS — M25641 Stiffness of right hand, not elsewhere classified: Secondary | ICD-10-CM | POA: Diagnosis not present

## 2023-12-14 MED ORDER — SODIUM CHLORIDE 0.9 % IV SOLN: Freq: Once | INTRAVENOUS | Status: AC

## 2023-12-14 NOTE — Addendum Note (Signed)
 Addended by: SANDIE NORLEEN SAILOR on: 12/14/2023 03:38 PM   Modules accepted: Orders

## 2023-12-14 NOTE — Progress Notes (Signed)
 Karen [name] presents today for phlebotomy per MD orders. Phlebotomy procedure started at 1335 and ended 200. 500 grams removed. Patient observed for 30 minutes after procedure without any incident. Patient tolerated procedure well. IV needle removed intact.  One Unit Phlebotomy drawn from patients port-a-cath. Site works well, flushes well also. IV fluids given.

## 2023-12-14 NOTE — Therapy (Signed)
 OUTPATIENT PHYSICAL THERAPY FEMALE PELVIC TREATMENT     Patient Name: Karen [name] MRN: [id number] DOB:03-26-1970, 53 y.o., female Today's Date: 12/14/2023  END OF SESSION:  PT End of Session - 12/14/23 0936     Visit Number 59    Date for Recertification  03/26/24    Authorization Type [name]    PT Start Time 0930    PT Stop Time 1010    PT Time Calculation (min) 40 min    Activity Tolerance Patient tolerated treatment well    Behavior During Therapy [facility] for tasks assessed/performed                      Past Medical History:  Diagnosis Date   Adenomyosis    Anemia    Arthritis 2013   L knee gets steroid injections    Arthritis, rheumatic, acute or subacute    Asthmatic bronchitis 01/31/2017   Back pain    Chronic constipation    Diarrhea    DVT of lower extremity (deep venous thrombosis) (HCC)    Dysmenorrhea    Endometriosis    Esophageal stricture    G6PD deficiency    GERD (gastroesophageal reflux disease)    History of kidney stones    History of uterine fibroid    IBS (irritable bowel syndrome)    Interstitial cystitis    Lactose intolerance    Migraine    with aura   Other hemochromatosis 06/10/2021   PONV (postoperative nausea and vomiting)    Recurrent upper respiratory infection (URI)    Sebaceous cyst of breast, right lower inner quadrant 10/25/2012   Excised 11/21/12    Sleep apnea    Syncope, non cardiac    Past Surgical History:  Procedure Laterality Date   ARTHROSCOPIC HAGLUNDS REPAIR     BREAST CYST EXCISION Right 11/21/2012   Procedure: CYST EXCISION BREAST;  Surgeon: Sherlean JINNY Laughter, MD;  Location: [facility];  Service: General;  Laterality: Right;   BREAST CYST EXCISION Bilateral 01/18/2022   Procedure: EXCISION OF SEBACEOUS CYST BILATERAL BREAST;  Surgeon: Vanderbilt Ned, MD;  Location: [facility];  Service: General;  Laterality: Bilateral;   BREAST EXCISIONAL BIOPSY  Right 11/2012   CESAREAN SECTION  04/19/1993   CHOLECYSTECTOMY     COLONOSCOPY  05/02/2011   Procedure: COLONOSCOPY;  Surgeon: Lynwood LITTIE Celestia Mickey., MD;  Location: [facility];  Service: Endoscopy;  Laterality: N/A;   colonoscopy  11/04/2014   DENTAL SURGERY  03/06/2018   2 surgeries ( 03/06/2018 and 04/06/2018)   to remove two separate benign tumors   ESOPHAGEAL MANOMETRY N/A 11/04/2015   Procedure: ESOPHAGEAL MANOMETRY (EM);  Surgeon: Lynwood Celestia, MD;  Location: [facility];  Service: Endoscopy;  Laterality: N/A;   ESOPHAGOGASTRODUODENOSCOPY  05/02/2011   Procedure: ESOPHAGOGASTRODUODENOSCOPY (EGD);  Surgeon: Lynwood LITTIE Celestia Mickey., MD;  Location: [facility];  Service: Endoscopy;  Laterality: N/A;   ESOPHAGOGASTRODUODENOSCOPY N/A 09/01/2014   Procedure: ESOPHAGOGASTRODUODENOSCOPY (EGD);  Surgeon: Lynwood Celestia, MD;  Location: [facility];  Service: Endoscopy;  Laterality: N/A;   IR CV LINE INJECTION  09/13/2023   IR CV LINE INJECTION  12/01/2023   IR IMAGING GUIDED PORT INSERTION  07/06/2023   IR IMAGING GUIDED PORT INSERTION  12/01/2023   IR REMOVAL TUN ACCESS W/ PORT W/O FL MOD SED  12/01/2023   KNEE SURGERY     LAPAROSCOPY     x 4   LESION REMOVAL N/A 01/18/2022  Procedure: EXCISION OF SEBACEOUS CYSTS CHEST AND NECK;  Surgeon: Vanderbilt Ned, MD;  Location:  [facility];  Service: General;  Laterality: N/A;   PH IMPEDANCE STUDY N/A 11/04/2015   Procedure: PH IMPEDANCE STUDY;  Surgeon: Lynwood Bohr, MD;  Location: [facility];  Service: Endoscopy;  Laterality: N/A;   VAGINAL HYSTERECTOMY  2010   TVH--ovaries remain   Patient Active Problem List   Diagnosis Date Noted   Sjogren syndrome with keratoconjunctivitis 12/09/2022   Other hemochromatosis 06/10/2021   Elevated LFTs 04/04/2021   Colitis 04/04/2021   Bacterial overgrowth syndrome 05/21/2020   Obesity 05/21/2020   History of endometriosis 05/21/2020   Constipation due to outlet dysfunction 02/05/2020    Gastroesophageal reflux disease 02/05/2020   Obstructive sleep apnea treated with continuous positive airway pressure (CPAP) 06/16/2017   Perennial allergic rhinitis with a nonallergic component 01/31/2017   Asthmatic bronchitis 01/31/2017   GI symptoms 01/31/2017   Family history of colon cancer 01/27/2015   Chest pain 12/13/2012   Dyspnea 12/13/2012   DVT of lower extremity (deep venous thrombosis) (HCC) 01/03/1990    PCP: Elliot Charm, MD  REFERRING PROVIDER: Cleotilde Ronal RAMAN, MD   REFERRING DIAG: M62.89 (ICD-10-CM) - Pelvic floor dysfunction  THERAPY DIAG:  Muscle weakness (generalized)  Other muscle spasm  Pelvic pain  Unspecified lack of coordination  Other low back pain  Rationale for Evaluation and Treatment: Rehabilitation  ONSET DATE: chronic  SUBJECTIVE:                                                                                                                                                                                           SUBJECTIVE STATEMENT:  Pt got MRI results and is going to get facet injections in low back next week to help with pain. She had port replaced. Pt states that she is going to have breath test to see if she has SIBO.   PAIN: 12/14/23 Are you having pain? Yes NPRS scale: 3/10 Pain location: Lower abdominal pain, Lt sided pain, low back pain  Pain type: turning, knotted, pressure Pain description: intermittent and constant   Aggravating factors: constipation or frequent bowel movements/constantly having bowel movements  Relieving factors: exercises (bowel massage, happy baby, spinal twists, walking)  PRECAUTIONS: None  WEIGHT BEARING RESTRICTIONS: No  FALLS:  Has patient fallen in last 6 months? No  LIVING ENVIRONMENT: Lives with: lives with their spouse Lives in: House/apartment  OCCUPATION: nurse, in admin  PLOF: Independent  PATIENT GOALS: decrease pain and have more normal bowel movements  PERTINENT  HISTORY:  Vaginal hysterectomy, DVT LE, endometriosis, interstitial cystitis, exploratory laps for endometriosis,  c-section, cholecystectomy, RA diagnosed 06/2023 Sexual abuse: No  BOWEL MOVEMENT: Pain with bowel movement: Yes Type of bowel movement:Type (Bristol Stool Scale) 1-7 (sometimes full range in the same bowel movement), Frequency sometimes many times a day, sometimes up to 4 days in between, and Strain Yes Fully empty rectum: No Leakage: No Pads: No Fiber supplement: No - has attempted metamucil and it made constipation worse  URINATION: Pain with urination: Yes Fully empty bladder: No Stream: varies  Urgency: Yes: all the time Frequency: multiple times an hours  Leakage: Urge to void, Walking to the bathroom, Coughing, Sneezing, Laughing, and Exercise Pads: Yes: daily  INTERCOURSE: Pain with intercourse: Initial Penetration and During Penetration Ability to have vaginal penetration:  Yes: with pain Climax: non painful WNL   PREGNANCY: Vaginal deliveries 1 Tearing Yes: 3rd degree tear C-section deliveries 1 Currently pregnant No  PROLAPSE: Periodically will feel vaginal bulging, heaviness in lower abdomen   OBJECTIVE:  10/10/23 Curl-up test: Minimal abdominal distortion focused in upper abdominals, largely improved Transversus abdominus: good core facilitation with exhale Sit-up test: 1/3  LUMBARAROM/PROM:  A/PROM A/PROM  Eval (% available) 11/09/22 (% available) 04/25/23 (% available) 10/10/23 (% available)  Flexion 50 90 90 100  Extension 25, pain anteriorly  25, pain in low back 50 75  Right lateral flexion 50, pain anteriorly  75, pain on right 50, pain on Rt 75  Left lateral flexion 50, pain anteriorly  75, pain on right 75 75   (Blank rows = not tested)    05/04/23:               Internal Pelvic Floor: mild tenderness in bil levator ani, but no increase in tension; with internal rectal exam, she was demonstrating paradoxical contraction when trying to  eccentrically lengthen with exhale and had very hard time correcting  Patient confirms identification and approves PT to assess internal pelvic floor and treatment Yes  PELVIC MMT:   MMT eval  Vaginal 2/5, 3 second hold, 3 repeat contractions  Internal Anal Sphincter 1/5  External Anal Sphincter 2/5  Puborectalis 1/5  Diastasis Recti   (Blank rows = not tested)        TONE: WNL   PROLAPSE: Grade 2 anterior vaginal wall laxity  04/25/23: Curl-up test: abdominal distortion Transversus abdominus: week, difficulty getting active contraction (not been working on strengthening)    LUMBARAROM/PROM:  A/PROM A/PROM  Eval (% available) 11/09/22 (% available) 04/25/23 (% available)  Flexion 50 90 90  Extension 25, pain anteriorly  25, pain in low back 50  Right lateral flexion 50, pain anteriorly  75, pain on right 50, pain on Rt  Left lateral flexion 50, pain anteriorly  75, pain on right 75   (Blank rows = not tested)  11/09/22: No internal rectal or vaginal pelvic floor exam performed due to high level of skin irritation   Weak transversus abdominus contraction  Abdominal restriction and tenderness in bil lower quadrant  Unable to perform pelvic tilt with appropriate coordination  LUMBARAROM/PROM:  A/PROM A/PROM  Eval (% available) 11/09/22 (% available)  Flexion 50 90  Extension 25, pain anteriorly  25, pain in low back  Right lateral flexion 50, pain anteriorly  75, pain on right  Left lateral flexion 50, pain anteriorly  75, pain on right   (Blank rows = not tested) 07/21/22: standing prolapse assessment demonstrated grade 2 anterior vaginal wall laxity   06/08/22:  External Perineal Exam: WNL                              Internal Pelvic Floor: burning reported with palpation of superficial muscles; deep aching/pressure with palpation of Lt levator ani   Patient confirms identification and approves PT to assess internal pelvic floor and treatment  Yes  PELVIC MMT:   MMT eval  Vaginal 3/5  Internal Anal Sphincter 1/5  External Anal Sphincter 2/5  Puborectalis 1/5  Diastasis Recti   (Blank rows = not tested)        TONE: High in Lt levator ani   PROLAPSE: Not able to tell this session due to dyssynergic pelvic floor contraction     06/01/22: COGNITION: Overall cognitive status: Within functional limits for tasks assessed     SENSATION: Light touch: Appears intact Proprioception: Appears intact  GAIT: Comments: decreased hip extension, forward flexed trunk  POSTURE: rounded shoulders, forward head, decreased lumbar lordosis, increased thoracic kyphosis, posterior pelvic tilt, and flexed trunk    LUMBARAROM/PROM:  A/PROM A/PROM  Eval (% available)  Flexion 50  Extension 25, pain anteriorly   Right lateral flexion 50, pain anteriorly   Left lateral flexion 50, pain anteriorly    (Blank rows = not tested)   PALPATION:   General  Significant abdominal restriction and tenderness; decreased rib mobility with mobilization and breathing    TODAY'S TREATMENT:                                                                                                                              DATE:  12/14/23 Manual: Supine: ILU bowel mobilization  Abdominal scar tissue mobilization Ileocecal valve mobilization External rectum mobilization Diaphragm release Neuromuscular re-education: Seated hip internal rotation + red band + block 10x  Seated hip abduction + red band 2 x 10 Seated unilateral hip abduction + red band 10x bil Seated march + red band 2 x 10 Therapeutic activities: Squats + 5 lbs to table 2 x 10 3 way kick 10x each, bil Pt education performed on discharge planning, independence with HEP, teaching husband manual techniques to perform at home   11/21/23 Manual: Supine: ILU bowel mobilization  Abdominal scar tissue mobilization Ileocecal valve mobilization External rectum mobilization Diaphragm  release Neuromuscular re-education: Seated clam + red band 3 x 10 Seated unilateral clam + red band 2 x 10 bil  Seated resisted march + red band 3 x 10 Squats 2 x 10 to table  Exercises: Standing hip circles 10x bil Seated piriformis stretch 60 sec bil   11/16/23 Manual: Supine: ILU bowel mobilization  Abdominal scar tissue mobilization Ileocecal valve mobilization External rectum mobilization Diaphragm release Exercises: Sideling clam shells + red band 2 x 10 bil Sidelying reverse clam shells 2 x 10 bil Open books 10x bil Resisted supine march + red band 2 x 10 Bridge + hip adduction 2 x 10  Bridge + march 2 x 10    PATIENT EDUCATION:  Education details: See above Person educated: Patient Education method: Explanation, Demonstration, Tactile cues, Verbal cues, and Handouts Education comprehension: verbalized understanding  HOME EXERCISE PROGRAM: [id number]  ASSESSMENT:  CLINICAL IMPRESSION: Pt doing well overall and all conditions feel stable at the moment. She is going to be starting treatment for low back pain anddysfunction with facet injections next week. We continued discharge planning and will plan to discharge after 2 more appointments in pelvic floor physical therapy. Believe she is independent with treatment and stable as long as she continues strengthening, mobility, and self-bowel mobilization. She has been having regular bowel movements without any stool burden build up. Good toleranc eto treatment today. She will continue to benefit from skilled PT intervention in order to decrease pain, improve bowel movements, and promote independence with HEP.     OBJECTIVE IMPAIRMENTS: decreased activity tolerance, decreased coordination, decreased endurance, decreased mobility, decreased strength, increased fascial restrictions, increased muscle spasms, impaired tone, postural dysfunction, and pain.   ACTIVITY LIMITATIONS: lifting, bending, continence, and locomotion  level  PARTICIPATION LIMITATIONS: interpersonal relationship, community activity, and occupation  PERSONAL FACTORS: 1 comorbidity: medical history are also affecting patient's functional outcome.   REHAB POTENTIAL: Good  CLINICAL DECISION MAKING: Stable/uncomplicated  EVALUATION COMPLEXITY: Low   GOALS: Goals reviewed with patient? Yes  SHORT TERM GOALS: Updated 10/10/23  Pt will be independent with HEP.   Baseline: Goal status: MET 07/12/22  2.  Pt will be independent with diaphragmatic breathing and down training activities in order to improve pelvic floor relaxation.  Baseline:  Goal status: MET 07/12/22  3.  Pt will be independent with the knack, urge suppression technique, and double voiding in order to improve bladder habits and decrease urinary incontinence.   Baseline:  Goal status: MET 09/22/22  4.  Pt will be independent with use of squatty potty, relaxed toileting mechanics, and improved bowel movement techniques in order to increase ease of bowel movements and complete evacuation.   Baseline:  Goal status: MET 11/09/22  5.  Pt will be able to correctly perform diaphragmatic breathing and appropriate pressure management in order to prevent worsening vaginal wall laxity and improve pelvic floor A/ROM.   Baseline:  Goal status: MET 01/18/23  LONG TERM GOALS: Updated 10/10/23  Pt will be independent with advanced HEP.   Baseline:  Goal status: IN PROGRESS 11/16/23  2.  Pt will demonstrate normal pelvic floor muscle tone and A/ROM, able to achieve 4/5 strength with contractions and 10 sec endurance, in order to provide appropriate lumbopelvic support in functional activities.   Baseline: not assessed today per patient request - no current bladder issues and they have all resolved Goal status: IN PROGRESS 11/16/23  3.  Pt will increase all impaired lumbar A/ROM by 25% without pain.  Baseline: Pt seen a little bit of loss in some and improvement in extension Goal  status:  IN PROGRESS 11/16/23  4.  Pt will report pain no higher than 3/10 with any activity. Baseline: pain currently 3/10, worst is an 8/10 and often corresponds to after she is eating Goal status:  IN PROGRESS 11/16/23  5.  Pt will report 0/10 pain with vaginal penetration in order to improve intimate relationship with partner.    Baseline: no recent intercourse attempts  Goal status:  IN PROGRESS 11/16/23  6.  Pt will be able to go 2-3 hours in between voids without urgency or incontinence in order to improve QOL  and perform all functional activities with less difficulty.   Baseline:  Goal status: MET 02/21/23  7.  Pt will report no leaks with laughing, coughing, sneezing in order to improve comfort with interpersonal relationships and community activities.   Baseline: Pt states that she feels 30-40% better Goal status:  MET 10/10/23  8.  Pt will have consistent bowel movements 4-5x/week without straining or pain.  Baseline:  Goal status:  MET 02/21/23  PLAN:  PT FREQUENCY: 1-2x/week  PT DURATION: 6 months  PLANNED INTERVENTIONS: Therapeutic exercises, Therapeutic activity, Neuromuscular re-education, Balance training, Gait training, Patient/Family education, Self Care, Joint mobilization, Dry Needling, Biofeedback, and Manual therapy  PLAN FOR NEXT SESSION: progress strengthening program; prepare for discharge in the next 6-8 visits   Josette Mares, PT, DPT12/11/20259:42 AM

## 2023-12-14 NOTE — Patient Instructions (Signed)

## 2023-12-15 ENCOUNTER — Inpatient Hospital Stay

## 2023-12-15 DIAGNOSIS — R1013 Epigastric pain: Secondary | ICD-10-CM | POA: Diagnosis not present

## 2023-12-15 DIAGNOSIS — R140 Abdominal distension (gaseous): Secondary | ICD-10-CM | POA: Diagnosis not present

## 2023-12-15 DIAGNOSIS — K63829 Intestinal methanogen overgrowth, unspecified: Secondary | ICD-10-CM | POA: Diagnosis not present

## 2023-12-15 DIAGNOSIS — K2100 Gastro-esophageal reflux disease with esophagitis, without bleeding: Secondary | ICD-10-CM | POA: Diagnosis not present

## 2023-12-18 ENCOUNTER — Ambulatory Visit: Admitting: Occupational Therapy

## 2023-12-18 ENCOUNTER — Other Ambulatory Visit: Payer: Self-pay

## 2023-12-18 ENCOUNTER — Other Ambulatory Visit (HOSPITAL_COMMUNITY): Payer: Self-pay

## 2023-12-18 DIAGNOSIS — I890 Lymphedema, not elsewhere classified: Secondary | ICD-10-CM | POA: Diagnosis not present

## 2023-12-18 MED ORDER — TRIAMCINOLONE ACETONIDE 0.1 % EX OINT
TOPICAL_OINTMENT | Freq: Two times a day (BID) | CUTANEOUS | 2 refills | Status: AC
  Filled 2023-12-18: qty 80, 90d supply, fill #0

## 2023-12-18 NOTE — Telephone Encounter (Signed)
 We can change it to triamcinolone  ointment however, I cannot guarantee that this will not burn as well.  She should use it for no longer than 10days at a time with a 2 weeks break before trying to restart it.

## 2023-12-18 NOTE — Therapy (Signed)
 OUTPATIENT OCCUPATIONAL THERAPY TREATMENT NOTE  BLE/ BLQ LYMPHEDEMA  Patient Name: Karen [name] MRN: [id number] DOB:1970/12/03, 53 y.o., female Today's Date: 12/18/2023  REPORTING PERIOD: 10/30/23 -   END OF SESSION:  OT End of Session - 12/18/23 1126     Visit Number 55    Number of Visits 72    Date for Recertification  03/17/24    OT Start Time 0903    OT Stop Time 1005    OT Time Calculation (min) 62 min    Activity Tolerance Patient tolerated treatment well;No increased pain    Behavior During Therapy [facility] for tasks assessed/performed           Past Medical History:  Diagnosis Date   Adenomyosis    Anemia    Arthritis 2013   L knee gets steroid injections    Arthritis, rheumatic, acute or subacute    Asthmatic bronchitis 01/31/2017   Back pain    Chronic constipation    Diarrhea    DVT of lower extremity (deep venous thrombosis) (HCC)    Dysmenorrhea    Endometriosis    Esophageal stricture    G6PD deficiency    GERD (gastroesophageal reflux disease)    History of kidney stones    History of uterine fibroid    IBS (irritable bowel syndrome)    Interstitial cystitis    Lactose intolerance    Migraine    with aura   Other hemochromatosis 06/10/2021   PONV (postoperative nausea and vomiting)    Recurrent upper respiratory infection (URI)    Sebaceous cyst of breast, right lower inner quadrant 10/25/2012   Excised 11/21/12    Sleep apnea    Syncope, non cardiac    Past Surgical History:  Procedure Laterality Date   ARTHROSCOPIC HAGLUNDS REPAIR     BREAST CYST EXCISION Right 11/21/2012   Procedure: CYST EXCISION BREAST;  Surgeon: Sherlean JINNY Laughter, MD;  Location: [facility];  Service: General;  Laterality: Right;   BREAST CYST EXCISION Bilateral 01/18/2022   Procedure: EXCISION OF SEBACEOUS CYST BILATERAL BREAST;  Surgeon: Vanderbilt Ned, MD;  Location: [facility];  Service: General;  Laterality: Bilateral;   BREAST  EXCISIONAL BIOPSY Right 11/2012   CESAREAN SECTION  04/19/1993   CHOLECYSTECTOMY     COLONOSCOPY  05/02/2011   Procedure: COLONOSCOPY;  Surgeon: Lynwood LITTIE Celestia Mickey., MD;  Location: [facility];  Service: Endoscopy;  Laterality: N/A;   colonoscopy  11/04/2014   DENTAL SURGERY  03/06/2018   2 surgeries ( 03/06/2018 and 04/06/2018)   to remove two separate benign tumors   ESOPHAGEAL MANOMETRY N/A 11/04/2015   Procedure: ESOPHAGEAL MANOMETRY (EM);  Surgeon: Lynwood Celestia, MD;  Location: [facility];  Service: Endoscopy;  Laterality: N/A;   ESOPHAGOGASTRODUODENOSCOPY  05/02/2011   Procedure: ESOPHAGOGASTRODUODENOSCOPY (EGD);  Surgeon: Lynwood LITTIE Celestia Mickey., MD;  Location: [facility];  Service: Endoscopy;  Laterality: N/A;   ESOPHAGOGASTRODUODENOSCOPY N/A 09/01/2014   Procedure: ESOPHAGOGASTRODUODENOSCOPY (EGD);  Surgeon: Lynwood Celestia, MD;  Location: [facility];  Service: Endoscopy;  Laterality: N/A;   IR CV LINE INJECTION  09/13/2023   IR CV LINE INJECTION  12/01/2023   IR IMAGING GUIDED PORT INSERTION  07/06/2023   IR IMAGING GUIDED PORT INSERTION  12/01/2023   IR REMOVAL TUN ACCESS W/ PORT W/O FL MOD SED  12/01/2023   KNEE SURGERY     LAPAROSCOPY     x 4   LESION REMOVAL N/A 01/18/2022   Procedure: EXCISION OF SEBACEOUS  CYSTS CHEST AND NECK;  Surgeon: Vanderbilt Ned, MD;  Location:  [facility];  Service: General;  Laterality: N/A;   PH IMPEDANCE STUDY N/A 11/04/2015   Procedure: PH IMPEDANCE STUDY;  Surgeon: Lynwood Bohr, MD;  Location: [facility];  Service: Endoscopy;  Laterality: N/A;   VAGINAL HYSTERECTOMY  2010   TVH--ovaries remain   Patient Active Problem List   Diagnosis Date Noted   Sjogren syndrome with keratoconjunctivitis 12/09/2022   Other hemochromatosis 06/10/2021   Elevated LFTs 04/04/2021   Colitis 04/04/2021   Bacterial overgrowth syndrome 05/21/2020   Obesity 05/21/2020   History of endometriosis 05/21/2020   Constipation due to outlet dysfunction  02/05/2020   Gastroesophageal reflux disease 02/05/2020   Obstructive sleep apnea treated with continuous positive airway pressure (CPAP) 06/16/2017   Perennial allergic rhinitis with a nonallergic component 01/31/2017   Asthmatic bronchitis 01/31/2017   GI symptoms 01/31/2017   Family history of colon cancer 01/27/2015   Chest pain 12/13/2012   Dyspnea 12/13/2012   DVT of lower extremity (deep venous thrombosis) (HCC) 01/03/1990   PCP: Gaile New, MD  REFERRING PROVIDER: Valery Ripple, MD  REFERRING DIAG: I89.0  THERAPY DIAG:  Lymphedema, not elsewhere classified  Rationale for Evaluation and Treatment: Rehabilitation  ONSET DATE: ~2014; exacerbation in 2023  SUBJECTIVE:                                                                                                                                                                                          SUBJECTIVE STATEMENT:  Pt presents for OT to address lower quadrant, including deep abdominal lymphatics, and LLE lymphedema w/ associated pain. Pt reports R neck swelling and discomfort have resolves since port was moved to the L side. Pt reports she noticed increased BLE swelling observed over the weekend without known cause. Pt reports DME vendor will replace her recent;ly purchased custom L leg compression stocking under warranty.    PERTINENT HISTORY: CVI, OSA (no CPAP), GERD, Esophageal stricture, IBS, Lactose intolerant, Diarrhea, Hx LE DVT, recent RA dx, OA  PAIN:  Are you having pain? Yes: /10- not rated numerically Pain location: abdomen; digestive discomfort; B leg fullness Pain description: abdominal fullness, tightness Aggravating factors: standing, walking,  Relieving factors: MLD, Miralax ,   PRECAUTIONS: Other: LYMPHEDEMA; Hx LLE DVT  WEIGHT BEARING RESTRICTIONS: No  FALLS:  Has patient fallen in last 6 months? No  LIVING ENVIRONMENT: Lives with: lives with spouse and daughter Lives in:  House/apartment Stairs: Yes; Internal: 14 steps; yes and External: 4 steps; yes Has following equipment at home: None  OCCUPATION: Dipl[employer] Services Operational Officer, walking, desk work  LEISURE: family time  HAND DOMINANCE: right   PRIOR LEVEL OF FUNCTION: Independent  PATIENT GOALS: Feel better, be able to be more active without pain, wear preferred street shoes. NEW 08/03/23: Be able to manage digestive discomfort and gut inflammation symptoms without medications  OBJECTIVE: Note: Objective measures were completed at Evaluation unless otherwise noted.  COGNITION:  Overall cognitive status: Within functional limits for tasks assessed   POSTURE: WFL  LE ROM: WFL  LE MMT: WFL  LYMPHEDEMA ASSESSMENTS: non-Ca related  INFECTIONS: denies cellulitis and wound hx  BLE COMPARATIVE LIMB VOLUMETRICS Initial 11/15/22  LANDMARK RIGHT (dominant)  R LEG (A-D) 3028.9 ml  R THIGH (E-G) 4809.60 ml  R FULL LIMB (A-G) 7838.5 ml  Limb Volume differential (LVD)  %  Volume change since initial %  Volume change overall V  (Blank rows = not tested)  LANDMARK LEFT  (Rx)  L LEG (A-D) 3189.9 ml  L THIGH (E-G) 4959.8 ml  L FULL LIMB (A-G) 8149.7 ml  Limb Volume differential (LVD)  LEG LVD = 5.32%, L>R; THIGH LVD = 3.12%, L>R; And full LLE LVD = 3.97%, L>R.   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  Skin  Description Hyper-Keratosis Peau d' Orange Shiny Tight Fibrotic/ Indurated Fatty Doughy Spongy/ boggy       R>L x  x   Skin dry Flaky WNL Macerated   mildly      Color Redness Varicosities  Blanching Hemosiderin Stain Mottled        x   Odor Malodorous Yeast Fungal infection  WNL      x   Temperature Warm Cool wnl     x    Pitting Edema   1+ 2+ 3+ 4+ Non-pitting         x   Girth Symmetrical Asymmetrical                   Distribution    L>R toes to groin    Stemmer Sign Positive Negative   +L -R   Lymphorrhea History Of:  Present Absent     x    Wounds History Of Present Absent Venous Arterial Pressure Sheer     x        Signs of Infection Redness Warmth Erythema Acute Swelling Drainage Borders                    Sensation Light Touch Deep pressure Hypersensitivity   In tact Impaired In tact Impaired Absent Impaired   x  x  x     Nails WNL   Fungus nail dystrophy   x     Hair Growth Symmetrical Asymmetrical   x    Skin Creases Base of toes  Ankles   Base of Fingers knees       Abdominal pannus Thigh Lobules  Face/neck   x           GAIT: Distance walked: >500' Assistive device utilized: None Level of assistance: Complete  Independence Comments: Functional ambulation and transfers [facility]  LYMPHEDEMA LIFE IMPACT SCALE (LLIS): Initial 11/15/22  50%  FOTO functional outcome measure: Deferred. FOTO discontinued.  TODAY'S TREATMENT:                                                                                                                                         MLD to abdominal lymphatics Pt edu for LE self care  PATIENT EDUCATION:  Continued Pt/ CG edu for how function of cisterna chyli, part of the thoracic duct of deep abdominal lymphatics, assists with digestion by filtering many of the fats and proteins from the digestive system. Sub optimal lymphatic flow in this region may result in constipation, bloating, swelling, etc. MLD helps mobilize these protein and fats , and the rhythmic movement of manual lymphatic drainage stimulates digestive muscles and increase peristalsis. Provided printed resource for reference.  Topics  include outcome of comparative limb volumetrics- starting limb volume differentials (LVDs), technology and gradient techniques used for short stretch, multilayer compression wrapping, simple self-MLD, therapeutic lymphatic pumping exercises, skin/nail care, LE precautions,. compression garment recommendations and specifications, wear and care schedule and compression garment donning / doffing w assistive devices. Discussed progress towards all OT goals since commencing CDT. All questions answered to the Pt's satisfaction. Good return. Person educated: Patient  Education method: Explanation, Demonstration, and Handouts Education comprehension: verbalized understanding, returned demonstration, verbal cues required, and needs further education  [facility] PRECAUTIONS: Pt education re precautions related to use of advanced sequential pneumatic compression device. Pt verbalized understanding that she should never use device on 2 arms/legs simultaneously and should not use device 2 x on same day to avoid overloading her heart with fluid volume return both at present, and in years to come should her medical condition change. Pt instructed to remove device immediately should she experience atypical SOP, light headedness, or acute pain. Pt instructed to discontinue pump if she suspects, or has any infection, blood clot, cellulitis, the flu, corona virus, etc.   HOME EXERCISE PROGRAM: BLE lymphatic pumping there ex using- 1 sets of 10 reps, each exercise in order-  1-2 x daily, bilaterally Simple self MLD 1 x daily Daily skin care to increase hydration, skin mobility and decrease infection risk- can be done during MLD Compression wraps 23/7 during intensive phase of CDT Compression garments/ devices during self-management phase of CDT  ASSESSMENT:  CLINICAL IMPRESSION:No obvious increase in BLE swelling observed today, but  sensations of swelling and fullness may sometimes be experienced without overt,  observable symptoms.Today we spent additional time on LLE MLD than we have been spending for several weeks.  Pt also tolerated deep abdominal MLD with very gentle short neck sequence.  Slight R lateral neck swelling is resolved. Pt continues to have digestive discomfort associated with chronic constipation alternating with diarrhea.  Pt tolerated the abdominal sequence as established without increased pain.   Pt continues  to  benefit from MLD for enhancing gut motility and bowel function.  MLD is also known to facilitate the removal of inflammatory substances and toxins from the intestinal area, which may help reduce inflammation linked to digestive disorders. Cont  1 x weekly through next visit, then decrease frequency to every other week for one month. Continue family training with Pt's daughter for simple MLD at home between sessions. Pt completed Flexitouch training and reports she is using the divide  several  times per week. Pt in agreement with this plan to continue OT at reduced every other week frequency.  (10/30/23 OT Progress Note: Pls review GOALS section below for additional details regarding progress towards goals. Pt understands and can apply lymphedema precautions after skilled training. Pt remains 100% compliant with lymphedema self care. Because lymphatic massage to one's own deep abdomen is challenging, especially with orthopedic issues Pt has had this year, Pt's daughter is learning to assist her mom with simple self MLD. Pt has also undergone a trial of the advanced Flexitouch PLUS sequential pneumatic compression device which features an abdominal, truncal, buttocks component above the level of the inguinal LN to facilitate fluid congestion towards the thoracic duct and terminus. Pt felt this helpful to her during the trial and she's hoping for insurance approval. Pt has obtained the recommended knee length compression garment and wears it for prolonged walking and dependent positioning.  Limb volume measurement in the lower extremity for comparison was not performed today due to time constraints, but by visual assessment it appears unchanged. Abdominal swelling and tightness is also unchanged by visual assessment and palpation. Pt has reached a clinical plateau and he agrees with plan to taper treatment to transition to LE self management phase of CDT. After her next session we'll reduce OT for every other week for a couple of weeks, then further reduce PRN.   (11/15/22 Initial Evaluation: Delphia Kaylor is a 53 y.o. female presenting with very mild, stage II, LLE lymphedema 2/2 venous insufficiency and obesity. L Leg swelling and associated pain swelling fluctuates. It has progressed over time, and now no longer resolves with elevation over night. RLE swelling limits balance during functional ambulation. It exacerbates infection and falls risk. LLE/RLQ lymphedema interferes with functional performance in all occupational domains, including basic and instrumental ADLs, productive activities, leisure pursuits and social participation. Pt will benefit from Occupational Therapy for Complete Decongestive Therapy (CDT) to restore function, to reduce physical and psychologic suffering associated with chronic, progressive lymphedema and associated pain, and to limit infection. CDT will include manual lymphatic drainage (MLD), skin care, therapeutic exercise and compression wraps and garment's devices. Without skilled OT for lymphedema care the condition will worsen and further functional decline is expected  Custom-made gradient compression garments and HOS devices are medically necessary because they are uniquely sized and shaped to fit the exact dimensions of the affected extremities, and to provide appropriate medical grade, graduated compression essential for optimally managing chronic, progressive lymphedema. Multiple custom compression garments are needed to ensure proper hygiene to limit  infection risk. Custom compression garments should be replaced q 3-6 months When worn consistently for optimal lipo-lymphedema self-management over time. HOS devices, medically necessary to limit fibrosis buildup in tissue, should be replaced q 2 years and PRN when worn out.      OBJECTIVE IMPAIRMENTS: decreased activity tolerance, decreased knowledge of condition, decreased knowledge of use of DME, increased edema, impaired sensation, pain, and chronic, progressive leg swelling.   ACTIVITY LIMITATIONS: dependent sitting, extended  standing and/ or walking, squatting, and lower body dressing, fitting preferred street shoes  PARTICIPATION LIMITATIONS: shopping, community activity, occupation, and yard work  PERSONAL FACTORS: 3+ comorbidities: Hx DVT,  OSA, CVI. Varicose veins are also affecting patient's functional outcome.   REHAB POTENTIAL: Good  EVALUATION COMPLEXITY: Moderate   GOALS: Goals reviewed with patient? Yes  SHORT TERM GOALS: Target date: 4th OT Rx visit  SHORT TERM GOALS: Target date: 4th OT Rx visit   Pt will demonstrate understanding of lymphedema precautions and prevention strategies with modified independence using a printed reference to identify at least 5 precautions and discussing how s/he may implement them into daily life to reduce risk of progression with extra time. Baseline:Max A Goal status: GOAL MET   Pt will be able to apply multilayer, knee length, gradient, compression wraps to one leg at a time with modified assistance (extra time and assistive device/s) to decrease limb volume, to limit infection risk, and to limit lymphedema progression.  Baseline: Dependent Goal status: GOAL DISCHARGED. Pt Going to compression garments instead  LONG TERM GOALS: Target date: 02/08/23  Given this patient's Intake score of 67%/100% on the functional outcomes FOTO tool, patient will experience an increase in function of 5 points to improve basic and instrumental ADLs  performance, including lymphedema self-care.  Baseline: Max A Goal status:GOAL DISCHARGED.  FOTO TOOL DISCONTINUED AT CLINIC   Given this patient's Intake score of 50% on the Lymphedema Life Impact Scale (LLIS), patient will experience a reduction of at least 5 points in her perceived level of functional impairment resulting from lymphedema to improve functional performance and quality of life (QOL). Baseline: 50% Goal status: PROGRESSING  Pt will achieve at least a 10% volume reduction in B legs to return limb to typical size and shape, to limit infection risk and LE progression, to decrease pain, to improve function. Baseline: Dependent Goal status: PROGRESSING  4.  Pt will obtain proper compression garments/devices and achieve modified independence (extra time + assistive devices) with donning/doffing to optimize limb volume reductions and limit LE  progression over time. Baseline:  Goal status:GOAL MET  5.  During Intensive phase CDT , with modified independence, Pt will achieve at least 85% compliance with all lymphedema self-care home program components, including daily skin care, compression wraps and /or garments, simple self MLD and lymphatic pumping therex to habituate LE self care protocol  into ADLs for optimal LE self-management over time. Baseline: Dependent Goal status:GOAL MET   PLAN:  OT FREQUENCY: 1 /week  OT DURATION: 12 weeks and PRN  PLANNED INTERVENTIONS: 97110-Therapeutic exercises, 97530- Therapeutic activity, 97535- Self Care, [id number]- Manual therapy, Patient/Family education, Manual lymph drainage, Compression bandaging, and fit with appropriate compression garments  PLAN FOR NEXT SESSION:  MLD as established Pt and family edu for LE self-care  Zebedee Dec, MS, OTR/L, CLT-LANA 12/18/2023 11:43 AM

## 2023-12-18 NOTE — Telephone Encounter (Signed)
 PIH will fade with time.

## 2023-12-20 ENCOUNTER — Other Ambulatory Visit (HOSPITAL_COMMUNITY): Payer: Self-pay

## 2023-12-20 ENCOUNTER — Ambulatory Visit: Admitting: Physical Therapy

## 2023-12-20 ENCOUNTER — Other Ambulatory Visit: Payer: Self-pay

## 2023-12-20 ENCOUNTER — Ambulatory Visit

## 2023-12-20 ENCOUNTER — Encounter: Payer: Self-pay | Admitting: Physical Therapy

## 2023-12-20 DIAGNOSIS — R279 Unspecified lack of coordination: Secondary | ICD-10-CM

## 2023-12-20 DIAGNOSIS — M6281 Muscle weakness (generalized): Secondary | ICD-10-CM

## 2023-12-20 DIAGNOSIS — M5459 Other low back pain: Secondary | ICD-10-CM

## 2023-12-20 DIAGNOSIS — R102 Pelvic and perineal pain unspecified side: Secondary | ICD-10-CM

## 2023-12-20 DIAGNOSIS — M62838 Other muscle spasm: Secondary | ICD-10-CM

## 2023-12-20 DIAGNOSIS — M542 Cervicalgia: Secondary | ICD-10-CM

## 2023-12-20 MED ORDER — XIFAXAN 550 MG PO TABS
550.0000 mg | ORAL_TABLET | Freq: Three times a day (TID) | ORAL | 0 refills | Status: AC
  Filled 2023-12-20 – 2023-12-29 (×2): qty 30, 10d supply, fill #0

## 2023-12-20 MED ORDER — NEOMYCIN SULFATE 500 MG PO TABS
500.0000 mg | ORAL_TABLET | Freq: Two times a day (BID) | ORAL | 0 refills | Status: DC
  Filled 2023-12-20: qty 20, 10d supply, fill #0

## 2023-12-20 NOTE — Therapy (Signed)
 OUTPATIENT PHYSICAL THERAPY FEMALE PELVIC TREATMENT     Patient Name: Karen [name] MRN: [id number] DOB:[birth year], 53 y.o., female Today's Date: 12/20/2023  END OF SESSION:  PT End of Session - 12/20/23 0936     Visit Number 60    Date for Recertification  03/26/24    Authorization Type [name]    PT Start Time [phone]    PT Stop Time 1014    PT Time Calculation (min) 38 min    Activity Tolerance Patient tolerated treatment well    Behavior During Therapy [facility] for tasks assessed/performed                      Past Medical History:  Diagnosis Date   Adenomyosis    Anemia    Arthritis 2013   L knee gets steroid injections    Arthritis, rheumatic, acute or subacute    Asthmatic bronchitis 01/31/2017   Back pain    Chronic constipation    Diarrhea    DVT of lower extremity (deep venous thrombosis) (HCC)    Dysmenorrhea    Endometriosis    Esophageal stricture    G6PD deficiency    GERD (gastroesophageal reflux disease)    History of kidney stones    History of uterine fibroid    IBS (irritable bowel syndrome)    Interstitial cystitis    Lactose intolerance    Migraine    with aura   Other hemochromatosis 06/10/2021   PONV (postoperative nausea and vomiting)    Recurrent upper respiratory infection (URI)    Sebaceous cyst of breast, right lower inner quadrant 10/25/2012   Excised 11/21/12    Sleep apnea    Syncope, non cardiac    Past Surgical History:  Procedure Laterality Date   ARTHROSCOPIC HAGLUNDS REPAIR     BREAST CYST EXCISION Right 11/21/2012   Procedure: CYST EXCISION BREAST;  Surgeon: Sherlean JINNY Laughter, MD;  Location: [facility];  Service: General;  Laterality: Right;   BREAST CYST EXCISION Bilateral 01/18/2022   Procedure: EXCISION OF SEBACEOUS CYST BILATERAL BREAST;  Surgeon: Vanderbilt Ned, MD;  Location: [facility];  Service: General;  Laterality: Bilateral;   BREAST EXCISIONAL BIOPSY  Right 11/2012   CESAREAN SECTION  04/19/1993   CHOLECYSTECTOMY     COLONOSCOPY  05/02/2011   Procedure: COLONOSCOPY;  Surgeon: Lynwood LITTIE Celestia Mickey., MD;  Location: [facility];  Service: Endoscopy;  Laterality: N/A;   colonoscopy  11/04/2014   DENTAL SURGERY  03/06/2018   2 surgeries ( 03/06/2018 and 04/06/2018)   to remove two separate benign tumors   ESOPHAGEAL MANOMETRY N/A 11/04/2015   Procedure: ESOPHAGEAL MANOMETRY (EM);  Surgeon: Lynwood Celestia, MD;  Location: [facility];  Service: Endoscopy;  Laterality: N/A;   ESOPHAGOGASTRODUODENOSCOPY  05/02/2011   Procedure: ESOPHAGOGASTRODUODENOSCOPY (EGD);  Surgeon: Lynwood LITTIE Celestia Mickey., MD;  Location: [facility];  Service: Endoscopy;  Laterality: N/A;   ESOPHAGOGASTRODUODENOSCOPY N/A 09/01/2014   Procedure: ESOPHAGOGASTRODUODENOSCOPY (EGD);  Surgeon: Lynwood Celestia, MD;  Location: [facility];  Service: Endoscopy;  Laterality: N/A;   IR CV LINE INJECTION  09/13/2023   IR CV LINE INJECTION  12/01/2023   IR IMAGING GUIDED PORT INSERTION  07/06/2023   IR IMAGING GUIDED PORT INSERTION  12/01/2023   IR REMOVAL TUN ACCESS W/ PORT W/O FL MOD SED  12/01/2023   KNEE SURGERY     LAPAROSCOPY     x 4   LESION REMOVAL N/A 01/18/2022  Procedure: EXCISION OF SEBACEOUS CYSTS CHEST AND NECK;  Surgeon: Vanderbilt Ned, MD;  Location: [facility];  Service: General;  Laterality: N/A;   PH IMPEDANCE STUDY N/A 11/04/2015   Procedure: PH IMPEDANCE STUDY;  Surgeon: Lynwood Bohr, MD;  Location: [facility];  Service: Endoscopy;  Laterality: N/A;   VAGINAL HYSTERECTOMY  2010   TVH--ovaries remain   Patient Active Problem List   Diagnosis Date Noted   Sjogren syndrome with keratoconjunctivitis 12/09/2022   Other hemochromatosis 06/10/2021   Elevated LFTs 04/04/2021   Colitis 04/04/2021   Bacterial overgrowth syndrome 05/21/2020   Obesity 05/21/2020   History of endometriosis 05/21/2020   Constipation due to outlet dysfunction 02/05/2020    Gastroesophageal reflux disease 02/05/2020   Obstructive sleep apnea treated with continuous positive airway pressure (CPAP) 06/16/2017   Perennial allergic rhinitis with a nonallergic component 01/31/2017   Asthmatic bronchitis 01/31/2017   GI symptoms 01/31/2017   Family history of colon cancer 01/27/2015   Chest pain 12/13/2012   Dyspnea 12/13/2012   DVT of lower extremity (deep venous thrombosis) (HCC) 01/03/1990    PCP: Elliot Charm, MD  REFERRING PROVIDER: Cleotilde Ronal RAMAN, MD   REFERRING DIAG: M62.89 (ICD-10-CM) - Pelvic floor dysfunction  THERAPY DIAG:  Muscle weakness (generalized)  Other muscle spasm  Pelvic pain  Unspecified lack of coordination  Other low back pain  Rationale for Evaluation and Treatment: Rehabilitation  ONSET DATE: chronic  SUBJECTIVE:                                                                                                                                                                                           SUBJECTIVE STATEMENT:  Pt states that her test was positive for SIBO, but she needs to get a repeat test. She gets facet infections tomorrow. She is trying to work on exercises more at home.   PAIN: 12/14/23 Are you having pain? Yes NPRS scale: 3/10 Pain location: Lower abdominal pain, Lt sided pain, low back pain  Pain type: turning, knotted, pressure Pain description: intermittent and constant   Aggravating factors: constipation or frequent bowel movements/constantly having bowel movements  Relieving factors: exercises (bowel massage, happy baby, spinal twists, walking)  PRECAUTIONS: None  WEIGHT BEARING RESTRICTIONS: No  FALLS:  Has patient fallen in last 6 months? No  LIVING ENVIRONMENT: Lives with: lives with their spouse Lives in: House/apartment  OCCUPATION: nurse, in admin  PLOF: Independent  PATIENT GOALS: decrease pain and have more normal bowel movements  PERTINENT HISTORY:  Vaginal  hysterectomy, DVT LE, endometriosis, interstitial cystitis, exploratory laps for endometriosis, c-section, cholecystectomy, RA diagnosed 06/2023 Sexual abuse: No  BOWEL MOVEMENT: Pain with bowel movement: Yes Type of bowel movement:Type (Bristol Stool Scale) 1-7 (sometimes full range in the same bowel movement), Frequency sometimes many times a day, sometimes up to 4 days in between, and Strain Yes Fully empty rectum: No Leakage: No Pads: No Fiber supplement: No - has attempted metamucil and it made constipation worse  URINATION: Pain with urination: Yes Fully empty bladder: No Stream: varies  Urgency: Yes: all the time Frequency: multiple times an hours  Leakage: Urge to void, Walking to the bathroom, Coughing, Sneezing, Laughing, and Exercise Pads: Yes: daily  INTERCOURSE: Pain with intercourse: Initial Penetration and During Penetration Ability to have vaginal penetration:  Yes: with pain Climax: non painful WNL   PREGNANCY: Vaginal deliveries 1 Tearing Yes: 3rd degree tear C-section deliveries 1 Currently pregnant No  PROLAPSE: Periodically will feel vaginal bulging, heaviness in lower abdomen   OBJECTIVE:  10/10/23 Curl-up test: Minimal abdominal distortion focused in upper abdominals, largely improved Transversus abdominus: good core facilitation with exhale Sit-up test: 1/3  LUMBARAROM/PROM:  A/PROM A/PROM  Eval (% available) 11/09/22 (% available) 04/25/23 (% available) 10/10/23 (% available)  Flexion 50 90 90 100  Extension 25, pain anteriorly  25, pain in low back 50 75  Right lateral flexion 50, pain anteriorly  75, pain on right 50, pain on Rt 75  Left lateral flexion 50, pain anteriorly  75, pain on right 75 75   (Blank rows = not tested)    05/04/23:               Internal Pelvic Floor: mild tenderness in bil levator ani, but no increase in tension; with internal rectal exam, she was demonstrating paradoxical contraction when trying to eccentrically  lengthen with exhale and had very hard time correcting  Patient confirms identification and approves PT to assess internal pelvic floor and treatment Yes  PELVIC MMT:   MMT eval  Vaginal 2/5, 3 second hold, 3 repeat contractions  Internal Anal Sphincter 1/5  External Anal Sphincter 2/5  Puborectalis 1/5  Diastasis Recti   (Blank rows = not tested)        TONE: WNL   PROLAPSE: Grade 2 anterior vaginal wall laxity  04/25/23: Curl-up test: abdominal distortion Transversus abdominus: week, difficulty getting active contraction (not been working on strengthening)    LUMBARAROM/PROM:  A/PROM A/PROM  Eval (% available) 11/09/22 (% available) 04/25/23 (% available)  Flexion 50 90 90  Extension 25, pain anteriorly  25, pain in low back 50  Right lateral flexion 50, pain anteriorly  75, pain on right 50, pain on Rt  Left lateral flexion 50, pain anteriorly  75, pain on right 75   (Blank rows = not tested)  11/09/22: No internal rectal or vaginal pelvic floor exam performed due to high level of skin irritation   Weak transversus abdominus contraction  Abdominal restriction and tenderness in bil lower quadrant  Unable to perform pelvic tilt with appropriate coordination  LUMBARAROM/PROM:  A/PROM A/PROM  Eval (% available) 11/09/22 (% available)  Flexion 50 90  Extension 25, pain anteriorly  25, pain in low back  Right lateral flexion 50, pain anteriorly  75, pain on right  Left lateral flexion 50, pain anteriorly  75, pain on right   (Blank rows = not tested) 07/21/22: standing prolapse assessment demonstrated grade 2 anterior vaginal wall laxity   06/08/22:               External Perineal Exam: WNL  Internal Pelvic Floor: burning reported with palpation of superficial muscles; deep aching/pressure with palpation of Lt levator ani   Patient confirms identification and approves PT to assess internal pelvic floor and treatment Yes  PELVIC MMT:    MMT eval  Vaginal 3/5  Internal Anal Sphincter 1/5  External Anal Sphincter 2/5  Puborectalis 1/5  Diastasis Recti   (Blank rows = not tested)        TONE: High in Lt levator ani   PROLAPSE: Not able to tell this session due to dyssynergic pelvic floor contraction     06/01/22: COGNITION: Overall cognitive status: Within functional limits for tasks assessed     SENSATION: Light touch: Appears intact Proprioception: Appears intact  GAIT: Comments: decreased hip extension, forward flexed trunk  POSTURE: rounded shoulders, forward head, decreased lumbar lordosis, increased thoracic kyphosis, posterior pelvic tilt, and flexed trunk    LUMBARAROM/PROM:  A/PROM A/PROM  Eval (% available)  Flexion 50  Extension 25, pain anteriorly   Right lateral flexion 50, pain anteriorly   Left lateral flexion 50, pain anteriorly    (Blank rows = not tested)   PALPATION:   General  Significant abdominal restriction and tenderness; decreased rib mobility with mobilization and breathing    TODAY'S TREATMENT:                                                                                                                              DATE:  12/20/23 Manual: Supine: ILU bowel mobilization  Abdominal scar tissue mobilization Ileocecal valve mobilization External rectum mobilization Diaphragm release Therapeutic activities: Squats to table 2 x 10 3 way kick 10x each bil Side stepping + blue loop 3 laps  Heel raises 3 x 10 Staggered deadlift and pt education through bending in this position when performing functional tasks 10x bil   12/14/23 Manual: Supine: ILU bowel mobilization  Abdominal scar tissue mobilization Ileocecal valve mobilization External rectum mobilization Diaphragm release Neuromuscular re-education: Seated hip internal rotation + red band + block 10x  Seated hip abduction + red band 2 x 10 Seated unilateral hip abduction + red band 10x bil Seated march  + red band 2 x 10 Therapeutic activities: Squats + 5 lbs to table 2 x 10 3 way kick 10x each, bil Pt education performed on discharge planning, independence with HEP, teaching husband manual techniques to perform at home   11/21/23 Manual: Supine: ILU bowel mobilization  Abdominal scar tissue mobilization Ileocecal valve mobilization External rectum mobilization Diaphragm release Neuromuscular re-education: Seated clam + red band 3 x 10 Seated unilateral clam + red band 2 x 10 bil  Seated resisted march + red band 3 x 10 Squats 2 x 10 to table  Exercises: Standing hip circles 10x bil Seated piriformis stretch 60 sec bil     PATIENT EDUCATION:  Education details: See above Person educated: Patient Education method: Explanation, Demonstration, Tactile cues, Verbal cues, and Handouts Education comprehension: verbalized understanding  HOME EXERCISE PROGRAM: [id number]  ASSESSMENT:  CLINICAL IMPRESSION: We have continued discharge planning and are planning for our last visit to be 01/16/24. We are still hoping that husband can get in for one of her appointments in order to learn bowel mobilization techniques. She did very well wit hall standing strengthening today and HEP was updated. We will continue to update new progressions over the next several sessions. She will continue to benefit from skilled PT intervention in order to decrease pain, improve bowel movements, and promote independence with HEP.     OBJECTIVE IMPAIRMENTS: decreased activity tolerance, decreased coordination, decreased endurance, decreased mobility, decreased strength, increased fascial restrictions, increased muscle spasms, impaired tone, postural dysfunction, and pain.   ACTIVITY LIMITATIONS: lifting, bending, continence, and locomotion level  PARTICIPATION LIMITATIONS: interpersonal relationship, community activity, and occupation  PERSONAL FACTORS: 1 comorbidity: medical history are also affecting  patient's functional outcome.   REHAB POTENTIAL: Good  CLINICAL DECISION MAKING: Stable/uncomplicated  EVALUATION COMPLEXITY: Low   GOALS: Goals reviewed with patient? Yes  SHORT TERM GOALS: Updated 10/10/23  Pt will be independent with HEP.   Baseline: Goal status: MET 07/12/22  2.  Pt will be independent with diaphragmatic breathing and down training activities in order to improve pelvic floor relaxation.  Baseline:  Goal status: MET 07/12/22  3.  Pt will be independent with the knack, urge suppression technique, and double voiding in order to improve bladder habits and decrease urinary incontinence.   Baseline:  Goal status: MET 09/22/22  4.  Pt will be independent with use of squatty potty, relaxed toileting mechanics, and improved bowel movement techniques in order to increase ease of bowel movements and complete evacuation.   Baseline:  Goal status: MET 11/09/22  5.  Pt will be able to correctly perform diaphragmatic breathing and appropriate pressure management in order to prevent worsening vaginal wall laxity and improve pelvic floor A/ROM.   Baseline:  Goal status: MET 01/18/23  LONG TERM GOALS: Updated 10/10/23  Pt will be independent with advanced HEP.   Baseline:  Goal status: IN PROGRESS 12/20/23  2.  Pt will demonstrate normal pelvic floor muscle tone and A/ROM, able to achieve 4/5 strength with contractions and 10 sec endurance, in order to provide appropriate lumbopelvic support in functional activities.   Baseline: not assessed today per patient request - no current bladder issues and they have all resolved Goal status: IN PROGRESS 12/20/23  3.  Pt will increase all impaired lumbar A/ROM by 25% without pain.  Baseline: Pt seen a little bit of loss in some and improvement in extension Goal status:  IN PROGRESS 12/20/23  4.  Pt will report pain no higher than 3/10 with any activity. Baseline: pain currently 3/10, worst is an 8/10 and often corresponds to  after she is eating Goal status:  IN PROGRESS 12/20/23  5.  Pt will report 0/10 pain with vaginal penetration in order to improve intimate relationship with partner.    Baseline: no recent intercourse attempts  Goal status:  IN PROGRESS 12/20/23  6.  Pt will be able to go 2-3 hours in between voids without urgency or incontinence in order to improve QOL and perform all functional activities with less difficulty.   Baseline:  Goal status: MET 02/21/23  7.  Pt will report no leaks with laughing, coughing, sneezing in order to improve comfort with interpersonal relationships and community activities.   Baseline: Pt states that she feels 30-40% better Goal status:  MET 10/10/23  8.  Pt will have consistent bowel movements 4-5x/week without straining or pain.  Baseline:  Goal status:  MET 02/21/23  PLAN:  PT FREQUENCY: 1-2x/week  PT DURATION: 6 months  PLANNED INTERVENTIONS: Therapeutic exercises, Therapeutic activity, Neuromuscular re-education, Balance training, Gait training, Patient/Family education, Self Care, Joint mobilization, Dry Needling, Biofeedback, and Manual therapy  PLAN FOR NEXT SESSION: plan for 01/16/24 to be last visit.   Josette Mares, PT, DPT12/17/20259:36 AM

## 2023-12-20 NOTE — Therapy (Signed)
 OUTPATIENT PHYSICAL THERAPY CERVICAL TREATMENT   Patient Name: Karen [name] MRN: [id number] DOB:September 12, 1970, 53 y.o., female Today's Date: 12/20/2023  END OF SESSION:  PT End of Session - 12/20/23 1232     Visit Number 22    Date for Recertification  12/25/24    Authorization Type [name]    PT Start Time 1231    PT Stop Time 1315    PT Time Calculation (min) 44 min    Activity Tolerance Patient tolerated treatment well                      Past Medical History:  Diagnosis Date   Adenomyosis    Anemia    Arthritis 2013   L knee gets steroid injections    Arthritis, rheumatic, acute or subacute    Asthmatic bronchitis 01/31/2017   Back pain    Chronic constipation    Diarrhea    DVT of lower extremity (deep venous thrombosis) (HCC)    Dysmenorrhea    Endometriosis    Esophageal stricture    G6PD deficiency    GERD (gastroesophageal reflux disease)    History of kidney stones    History of uterine fibroid    IBS (irritable bowel syndrome)    Interstitial cystitis    Lactose intolerance    Migraine    with aura   Other hemochromatosis 06/10/2021   PONV (postoperative nausea and vomiting)    Recurrent upper respiratory infection (URI)    Sebaceous cyst of breast, right lower inner quadrant 10/25/2012   Excised 11/21/12    Sleep apnea    Syncope, non cardiac    Past Surgical History:  Procedure Laterality Date   ARTHROSCOPIC HAGLUNDS REPAIR     BREAST CYST EXCISION Right 11/21/2012   Procedure: CYST EXCISION BREAST;  Surgeon: Sherlean JINNY Laughter, MD;  Location: [facility];  Service: General;  Laterality: Right;   BREAST CYST EXCISION Bilateral 01/18/2022   Procedure: EXCISION OF SEBACEOUS CYST BILATERAL BREAST;  Surgeon: Vanderbilt Ned, MD;  Location:  SURGERY CENTER;  Service: General;  Laterality: Bilateral;   BREAST EXCISIONAL BIOPSY Right 11/2012   CESAREAN SECTION  04/19/1993   CHOLECYSTECTOMY      COLONOSCOPY  05/02/2011   Procedure: COLONOSCOPY;  Surgeon: Lynwood LITTIE Celestia Mickey., MD;  Location: [facility];  Service: Endoscopy;  Laterality: N/A;   colonoscopy  11/04/2014   DENTAL SURGERY  03/06/2018   2 surgeries ( 03/06/2018 and 04/06/2018)   to remove two separate benign tumors   ESOPHAGEAL MANOMETRY N/A 11/04/2015   Procedure: ESOPHAGEAL MANOMETRY (EM);  Surgeon: Lynwood Celestia, MD;  Location: [facility];  Service: Endoscopy;  Laterality: N/A;   ESOPHAGOGASTRODUODENOSCOPY  05/02/2011   Procedure: ESOPHAGOGASTRODUODENOSCOPY (EGD);  Surgeon: Lynwood LITTIE Celestia Mickey., MD;  Location: [facility];  Service: Endoscopy;  Laterality: N/A;   ESOPHAGOGASTRODUODENOSCOPY N/A 09/01/2014   Procedure: ESOPHAGOGASTRODUODENOSCOPY (EGD);  Surgeon: Lynwood Celestia, MD;  Location: [facility];  Service: Endoscopy;  Laterality: N/A;   IR CV LINE INJECTION  09/13/2023   IR CV LINE INJECTION  12/01/2023   IR IMAGING GUIDED PORT INSERTION  07/06/2023   IR IMAGING GUIDED PORT INSERTION  12/01/2023   IR REMOVAL TUN ACCESS W/ PORT W/O FL MOD SED  12/01/2023   KNEE SURGERY     LAPAROSCOPY     x 4   LESION REMOVAL N/A 01/18/2022   Procedure: EXCISION OF SEBACEOUS CYSTS CHEST AND NECK;  Surgeon: Vanderbilt Ned, MD;  Location: [facility];  Service: General;  Laterality: N/A;   PH IMPEDANCE STUDY N/A 11/04/2015   Procedure: PH IMPEDANCE STUDY;  Surgeon: Lynwood Bohr, MD;  Location: [facility];  Service: Endoscopy;  Laterality: N/A;   VAGINAL HYSTERECTOMY  2010   TVH--ovaries remain   Patient Active Problem List   Diagnosis Date Noted   Sjogren syndrome with keratoconjunctivitis 12/09/2022   Other hemochromatosis 06/10/2021   Elevated LFTs 04/04/2021   Colitis 04/04/2021   Bacterial overgrowth syndrome 05/21/2020   Obesity 05/21/2020   History of endometriosis 05/21/2020   Constipation due to outlet dysfunction 02/05/2020   Gastroesophageal reflux disease 02/05/2020   Obstructive sleep apnea  treated with continuous positive airway pressure (CPAP) 06/16/2017   Perennial allergic rhinitis with a nonallergic component 01/31/2017   Asthmatic bronchitis 01/31/2017   GI symptoms 01/31/2017   Family history of colon cancer 01/27/2015   Chest pain 12/13/2012   Dyspnea 12/13/2012   DVT of lower extremity (deep venous thrombosis) (HCC) 01/03/1990    PCP: Elliot Charm, MD   REFERRING PROVIDER: Huey Redell Fallow, PA-C  REFERRING DIAG: M54.2 (ICD-10-CM) - Cervicalgia  THERAPY DIAG:  Muscle weakness (generalized)  Cervicalgia  Other muscle spasm  Rationale for Evaluation and Treatment: Rehabilitation  ONSET DATE: July 2025  SUBJECTIVE:                                                                                                                                                                                                         SUBJECTIVE STATEMENT: Lumbar spinal injections tomorrow.  Will start on antibiotics based on GI test results.  Going to see a cardiologist regarding some chest pain.  New port worked well.  No neck swelling since port side changed.  Hip hinge really helped!   Has a new order for aquatic PT to include LBP with cervical pain from Dr. Duwayne (will start in Jan when recovered from port placement/allergic reaction)   Eval: Reports onset of L UT pain following port placement into R shoulder Hand dominance: Right  PERTINENT HISTORY:  07/21/2023 Assessment: - suspect left-sided cervical/ trapezius muscle spasm - reassured by absence of red flag signs/ symptoms at this time   Concurrent lymphedema PAIN:  Are you having pain? Yes: NPRS scale: 2/10 Pain location: L UT Pain description: ache, cramp Aggravating factors: activity Relieving factors: heat, medication  PRECAUTIONS: None  RED FLAGS: None     WEIGHT BEARING RESTRICTIONS: No  FALLS:  Has patient fallen in last 6 months? No  OCCUPATION: [name] Employee  PLOF:  Independent  PATIENT GOALS: To manage my neck pain  NEXT MD VISIT: TBD  OBJECTIVE:  Note: Objective measures were completed at Evaluation unless otherwise noted.  DIAGNOSTIC FINDINGS:  Oct 2025 C3-4 mod bulging disc osteophyte C4-5 mod to severe bulging disc osteophyte C5-6 severe bulging osteophyte C6-7 minimal retrolisthesis of c6 and C7 with severe bulging disc T2-63mod left disc protrusion  subarticular  12/2 MRI lumbar & cervical  MRI cervical spine demonstrates C6-C7 moderately severe left and moderate right foraminal stenosis. Endplate Modic changes are noted. T2-T3 partially visualized moderate left subarticular disc protrusion with impingement upon and probable minimal deformity of the left lateral cord. Likely impinging on the left T3 nerve root.  X-rays of the cervical spine demonstrate cervical spondylosis predominantly at C4-5 C5-6 and C6-7. No subluxation at C1-C2.   X-rays lumbar spine demonstrates a mild dextrorotatory scoliosis. There is disc degeneration L5-S1 remainder obscured by barium.   MRI lumbar spine degenerative spinal listhesis L4-5 with severe facet arthrosis bulging disc mild stenosis. Mild degenerative changes small disc at L5-S1 and L3-4.    PATIENT SURVEYS:  NDI: 28/50 56% perceived disability  Minimum Detectable Change (90% confidence): 5 points or 10% points  10/03/23:  NDI  30%   The Patient-Specific Functional Scale  Initial:  I am going to ask you to identify up to 3 important activities that you are unable to do or are having difficulty with as a result of this problem.  Today are there any activities that you are unable to do or having difficulty with because of this?  (Patient shown scale and patient rated each activity)  Follow up: When you first came in you had difficulty performing these activities.  Today do you still have difficulty?  Patient-Specific activity scoring scheme (Point to one number):  0 1 2 3 4 5 6 7 8 9  10 Unable                                                                                                           Able to perform To perform                                                                                                    activity at the same Activity         Level as before  Injury or problem  Activity       Computer work 1 hour                                     9/30: 5                     11/12  5  2.          Cooking                                                           9/30: 5                                                                                         3.         Sit and watch TV                                              9/30: 5                                                                         POSTURE: rounded shoulders and forward head  PALPATION: Global tenderness throughout B UT and posterior shoulder girdle   CERVICAL ROM:   Active ROM A/PROM (deg) eval 9/30 11/12  Flexion 75%P! 47 50  Extension 75%P! 45 46  Right lateral flexion 75%P! 36 43  Left lateral flexion 75%P! 29 31  Right rotation 75%P! 35 40  Left rotation 75%P! 29 22   (Blank rows = not tested)  UPPER EXTREMITY ROM:  Active ROM Right eval Left eval 9/30 11/12  Shoulder flexion 150 150 L 155  R 151   Shoulder extension      Shoulder abduction 150 150 L 167 R 161 R 150 L 155  Shoulder adduction      Shoulder extension      Shoulder internal rotation      Shoulder external rotation      Elbow flexion      Elbow extension      Wrist flexion      Wrist extension      Wrist ulnar deviation      Wrist radial deviation      Wrist pronation      Wrist supination       (Blank rows = not tested)  UPPER EXTREMITY MMT:  MMT Right eval Left eval 9/30 11/12  Shoulder flexion  Shoulder extension      Shoulder abduction      Shoulder adduction       Shoulder extension      Shoulder internal rotation      Shoulder external rotation      Middle trapezius   Bil 4-/5 Bil 4/5  Lower trapezius   Bil 4-/5 Bil 4/5  Elbow flexion      Elbow extension      Wrist flexion      Wrist extension      Wrist ulnar deviation      Wrist radial deviation      Wrist pronation      Wrist supination      Grip strength       (Blank rows = not tested)  CERVICAL SPECIAL TESTS:  Neck flexor muscle endurance test: Negative 9/30 decreased cervical extensor strength 4-/5 11/12: deep cervical flexors and exensors 4/5  FUNCTIONAL TESTS:  N/A  TREATMENT  12/20/2023 Patient reports she has been implementing hip hinge with function Plan to do ERO next visit (pt has new referral from Dr. Duwayne to include Low back pain and initiation of aquatic PT) HEP update with areas of focus Cervical ROM in all planes 3-5 reps each (Added to HEP- see below) Shoulder supine flexion 3-5 reps (Added to HEP- see below) Seated or standing shoulder abduction 3-5 reps (Added to HEP- see below) Manual:  grade 2 cervical distraction 3x 10; gentle upper trap lengthening right/left; suboccipital release; rotation mobs C2-4, lateral side glides C4-7 right/left grade 2/3; upper cervical flexion mob grade 2/3 10x  12/13/2023 Discussion of status including new port an allergic reaction Plan to do ERO 12/23 (pt has new referral from Dr. Duwayne to include Low back pain and initiation of aquatic PT) Body mechanics with standing: foot prop  Hip hinge with dowel and 3 points of contact and neutral spine alignment10x Hip hinge for functional movement to retrieve pots/pans and pick up objects from the floor Manual:  grade 2 cervical distraction 3x 10; gentle upper trap lengthening right/left; suboccipital release; rotation mobs C2-4, lateral side glides C4-7 right/left grade 2/3; upper cervical flexion mob grade 2/3 10x  12/06/2023 Lengthy discussion of imaging findings & plan for  therapy Manual; patient seated: cervical distraction x 5 10 sec holds ; STM to bilateral upper trap & suboccipitals  Education on benefits of mechanical traction and how it works      11/28/2023 Standing deep cervical nods x 10 at wall Standing deep cervical against purple ball x 10 Standing deep cervical nods on wall with shoulder press against wall (pressing into purple ball) 10x, bil UE elevation 2x5;  Thoracic extension against purple ball x 8 Thoracic rotation with purple ball at back x 5 each direction Lumbar extension at wall x 5  Provided exercises to help with radicular symptoms. Patient verbalized relief.  Manual:  grade 2 cervical distraction 3x 10; gentle upper trap lengthening right/left; suboccipital release; rotation mobs C2-4, lateral side glides C4-7 right/left grade 2/3   11/22/2023 Update on status & upcoming appointments  Seated deep cervical nods Standing deep cervical nods on wall 2 x5 Standing deep cervical nods on wall with shoulder press against wall 10x, bil UE elevation 2x5;  Isometric deep cervical extension against PT hand 2 x 5 5 sec hold Manual:  grade 2 cervical distraction 3x 10; gentle upper trap lengthening right/left; suboccipital release; rotation mobs C2-4, lateral side glides C4-7 right/left grade 2/3     11/15/2023  Discussion of aquatic PT after new port placement and when cleared to start Discussed benefit of strengthening cervical deep cervical flexors for stabilization Seated deep cervical nods Standing deep cervical nods on wall Standing deep cervical nods on wall with shoulder press against wall 10x, bil UE elevation 5x; green loop around wrists steering wheel turns Facing wall: red band behind head, hands holding band to wall with isometric head press 5 sec hold 10x Cervical ROM as above Shoulder ROM as above Manual:  grade 2 cervical distraction 3x 10; gentle upper trap lengthening right/left; suboccipital release; rotation mobs  C2-4, lateral side glides C4-7 right/left grade 2/3   11/08/2023 Update on status and MRI results Hooklying chin tuck x 12 Hooklying scap retraction x 12 Hookyling chin tuck + elbow press down x 12 Hooklying chin tuck + shoulder press down x 12 Decompression series: whole leg lengthening, whole leg press down 5x 5 sec holds Manual: cervical occipital release x 10 5 sec holds and STM to bilateral upper traps    10/31/2023 Discussion of her visit with Dr. Duwayne regarding back pain and his referral to [facility] aquatic PT Discussed benefit of strengthening cervical deep cervical flexors for stabilization Supine deep cervical activation with: UE elevation alternating, elbow press down, shoulder press down 5x (Added to HEP- pt instructions)  Decompression series: whole leg lengthening, whole leg press down 5x 5 sec holds (Added to HEP- pt instructions) Manual:  grade 2 cervical distraction 3x 10; gentle upper trap lengthening right/left; suboccipital release Saunders cervical traction 8# static 3 min  10/25/2023 Discussed results of recent cervical MRI Postural alignment and how that affects load on cervical spine Discussed benefit of strengthening cervical deep cervical flexors for stabilization Standing green band with handles: bil shoulder extension with cue for deep cervical flexion activation Supine deep cervical nods 5-10 sec holds (Added to HEP- see below) Seated deep cervical activation (Added to HEP- see below) Manual: seated soft tissue mobilization to right > left cervical paraspinals and suboccipitals; grade 2 cervical distraction 3x 10; avoided right upper trap soft tissue mob secondary to blood clot in right port                                                                                                           PATIENT EDUCATION:  Education details: Discussed eval findings, rehab rationale and POC and patient is in agreement  Person educated: Patient Education  method: Explanation and Handouts Education comprehension: verbalized understanding and needs further education  HOME EXERCISE PROGRAM: Access Code: [email or web address] URL: [email or web address] Date: 12/20/2023 Prepared by: Glade Pesa  Exercises - Seated Shoulder Shrugs  - 2 x daily - 5 x weekly - 2 sets - 10 reps - Seated Scapular Retraction  - 2 x daily - 5 x weekly - 2 sets - 10 reps - Gentle Levator Scapulae Stretch  - 1 x daily - 7 x weekly - 1 sets - 2 reps - 30 seconds hold - Seated Cervical Sidebending Stretch  - 1 x daily - 7 x weekly - 1 sets -  2 reps - 30 seconds hold - Theatre Manager with Chair and Counter Support  - 1 x daily - 7 x weekly - 2 sets - 20-30s hold - Standing Hamstring Stretch with Step  - 1 x daily - 7 x weekly - 2 sets - 20-30s hold - Seated Hamstring Stretch  - 1 x daily - 7 x weekly - 2 sets - 20-30 hold - Seated Hip Flexor Stretch  - 1 x daily - 7 x weekly - 2 sets - 20-30 hold - Seated Scalene Stretch with Towel  - 1 x daily - 7 x weekly - 2 sets - 20 reps - Seated Modified Small Range Crunch in Chair  - 1 x daily - 7 x weekly - 1-2 sets - 10 reps - Standing Marching  - 1 x daily - 7 x weekly - 1 sets - 10 reps - Median Nerve Flossing - Tray  - 1 x daily - 7 x weekly - 1 sets - 10 reps - Seated Cervical Traction  - 1 x daily - 7 x weekly - 1 sets - 10 reps - Seated Neck and Traction  - 1 x daily - 7 x weekly - 1 sets - 10 reps - Standing Low Shoulder Row with Anchored Resistance  - 1 x daily - 7 x weekly - 1 sets - 10 reps - Seated Thoracic Flexion and Rotation with Arms Crossed  - 1 x daily - 7 x weekly - 1 sets - 5 reps - Seated Thoracic Lumbar Extension  - 1 x daily - 7 x weekly - 1 sets - 5-10 reps - Supine Head Nod with Deep Neck Flexor Activation  - 1 x daily - 7 x weekly - 1 sets - 10 reps - Seated Deep Neck Flexor Nods  - 1 x daily - 7 x weekly - 1 sets - 10 reps - Standing Lumbar Extension at Wall - Forearms  - 1 x daily - 7 x weekly - 1  sets - 5-8 reps - 2s hold - Lying Prone  - 1 x daily - 7 x weekly - 3-40m hold - Lying Prone with 1 Pillow  - 1 x daily - 7 x weekly - 3-55m hold - Prone Press Up On Elbows  - 1 x daily - 7 x weekly - 1 sets - 5-8 reps - 3-5s hold - Seated Cervical Rotation AROM  - 1 x daily - 7 x weekly - 1 sets - 3-5 reps - Seated Cervical Flexion AROM  - 1 x daily - 7 x weekly - 1 sets - 3-5 reps - Seated Cervical Sidebending AROM  - 1 x daily - 7 x weekly - 1 sets - 3-5 reps - Seated Cervical Extension AROM  - 1 x daily - 7 x weekly - 1 sets - 3-5 reps - Supine Shoulder Flexion AAROM with Hands Clasped  - 1 x daily - 7 x weekly - 1 sets - 3-5 reps - Seated Shoulder Abduction and External Rotation AROM  - 1 x daily - 7 x weekly - 1 sets - 3-5 reps ASSESSMENT:  CLINICAL IMPRESSION: Pt with decreasing neck pain and shoulder pain since her port was moved to the other side.  Anticipate improved ROM measurements on reassessment next visit.  She is having lumbar facet injections tomorrow and has a new referral from her orthopedist to include a lumbar diagnosis and aquatic PT to the treatment plan.  Improving soft tissue mobility and decreased tender point  size and number compared to prior visits. Updated HEP with areas of focus over the next 5 days.       OBJECTIVE IMPAIRMENTS: decreased activity tolerance, decreased mobility, decreased ROM, increased fascial restrictions, increased muscle spasms, impaired UE functional use, postural dysfunction, and pain.   ACTIVITY LIMITATIONS: carrying, lifting, bending, bed mobility, and reach over head  PERSONAL FACTORS: Age, Fitness, Past/current experiences, and 1 comorbidity: lymphedema are also affecting patient's functional outcome.   REHAB POTENTIAL: Good  CLINICAL DECISION MAKING: Stable/uncomplicated  EVALUATION COMPLEXITY: Low   GOALS: Goals reviewed with patient? No    SHORT TERM GOALS=LONG TERM GOALS: Target date: 12/26/2023  Patient to demonstrate  independence in HEP  Baseline: [id number] Goal status: ongoing  2.  Patient will acknowledge 4/10 pain at least once during episode of care   Baseline: 8/10 Goal status: ongoing  3.  Patient will score at least 20/50 on NDI to signify clinically meaningful improvement in functional abilities.   Baseline: 28/50 Goal status:  met 9/30  4.  160d AROM in B shoulder flexion/abduction Baseline: 150d Goal status: ongoing  5.  Improved cervical rotation ROM to 45 degrees needed for driving revised Goal status:   6. Be able to walk 1 hour or navigate hills at [facility] new  7. PSFS rating for computer work improved to 6-7 new  8. PSFS rating for cooking improved to 6 new   PLAN:  PT FREQUENCY: 1x/week  PT DURATION: 12 weeks   PLANNED INTERVENTIONS: 97110-Therapeutic exercises, 97530- Therapeutic activity, 97112- Neuromuscular re-education, 97535- Self Care, and 02859- Manual therapy  PLAN FOR NEXT SESSION  ERO and change provider to Dr. Duwayne include lumbar diagnosis and add aquatics (already scheduled); order given to front office; NDI, PSFS,  cervical ROM and shoulder ROM objective measurements; postural strengthening; manual techniques as appropriate  Glade Pesa, PT 12/20/2023 4:50 PM Phone: [phone] Fax: [phone]

## 2023-12-21 ENCOUNTER — Other Ambulatory Visit (HOSPITAL_COMMUNITY): Payer: Self-pay

## 2023-12-21 ENCOUNTER — Other Ambulatory Visit: Payer: Self-pay | Admitting: Hematology & Oncology

## 2023-12-21 DIAGNOSIS — M47816 Spondylosis without myelopathy or radiculopathy, lumbar region: Secondary | ICD-10-CM | POA: Diagnosis not present

## 2023-12-21 MED ORDER — FLUCONAZOLE 150 MG PO TABS
150.0000 mg | ORAL_TABLET | Freq: Every day | ORAL | 1 refills | Status: DC
  Filled 2023-12-21: qty 1, 1d supply, fill #0

## 2023-12-21 MED ORDER — LIDOCAINE-PRILOCAINE 2.5-2.5 % EX CREA
1.0000 | TOPICAL_CREAM | CUTANEOUS | 0 refills | Status: AC | PRN
  Filled 2023-12-21: qty 30, 30d supply, fill #0

## 2023-12-22 ENCOUNTER — Telehealth (HOSPITAL_COMMUNITY): Payer: Self-pay | Admitting: *Deleted

## 2023-12-22 ENCOUNTER — Other Ambulatory Visit: Payer: Self-pay

## 2023-12-22 ENCOUNTER — Other Ambulatory Visit (HOSPITAL_COMMUNITY): Payer: Self-pay

## 2023-12-22 ENCOUNTER — Ambulatory Visit: Attending: Cardiology | Admitting: Cardiology

## 2023-12-22 ENCOUNTER — Encounter: Payer: Self-pay | Admitting: Cardiology

## 2023-12-22 VITALS — BP 112/70 | HR 85 | Ht 67.5 in | Wt 218.5 lb

## 2023-12-22 DIAGNOSIS — R072 Precordial pain: Secondary | ICD-10-CM | POA: Diagnosis not present

## 2023-12-22 DIAGNOSIS — R0609 Other forms of dyspnea: Secondary | ICD-10-CM

## 2023-12-22 DIAGNOSIS — R635 Abnormal weight gain: Secondary | ICD-10-CM | POA: Diagnosis not present

## 2023-12-22 DIAGNOSIS — K6389 Other specified diseases of intestine: Secondary | ICD-10-CM | POA: Diagnosis not present

## 2023-12-22 DIAGNOSIS — K5900 Constipation, unspecified: Secondary | ICD-10-CM | POA: Diagnosis not present

## 2023-12-22 DIAGNOSIS — R109 Unspecified abdominal pain: Secondary | ICD-10-CM | POA: Diagnosis not present

## 2023-12-22 DIAGNOSIS — K582 Mixed irritable bowel syndrome: Secondary | ICD-10-CM | POA: Diagnosis not present

## 2023-12-22 DIAGNOSIS — M5116 Intervertebral disc disorders with radiculopathy, lumbar region: Secondary | ICD-10-CM | POA: Diagnosis not present

## 2023-12-22 MED ORDER — XIIDRA 5 % OP SOLN
1.0000 [drp] | Freq: Two times a day (BID) | OPHTHALMIC | 3 refills | Status: AC
  Filled 2023-12-22: qty 180, 90d supply, fill #0

## 2023-12-22 NOTE — Patient Instructions (Addendum)
 Medication Instructions:   No changes  *If you need a refill on your cardiac medications before your next appointment, please call your pharmacy*   Lab Work: Not needed   Testing/Procedures: 1) Your physician has requested that you have an echocardiogram. Echocardiography is a painless test that uses sound waves to create images of your heart. It provides your doctor with information about the size and shape of your heart and how well your hearts chambers and valves are working. This procedure takes approximately one hour. There are no restrictions for this procedure. Please do NOT wear cologne, perfume, aftershave, or lotions (deodorant is allowed). Please arrive 15 minutes prior to your appointment time.  Please note: We ask at that you not bring children with you during ultrasound (echo/ vascular) testing. Due to room size and safety concerns, children are not allowed in the ultrasound rooms during exams. Our front office staff cannot provide observation of children in our lobby area while testing is being conducted. An adult accompanying a patient to their appointment will only be allowed in the ultrasound room at the discretion of the ultrasound technician under special circumstances. We apologize for any inconvenience.   2) Will schedule be at [address] [facility]. Your physician has requested that you have an exercise tolerance test.. An exercise tolerance test is a test to check how your heart works during exercise. You will need to walk on a treadmill or for this test. An electrocardiogram (ECG) will record your heartbeat when you are at rest and when you are exercising.  Please also follow instruction sheet, as given.   Do not drink or eat foods with caffeine for 24 hours before the test. (Chocolate, coffee, tea, or energy drinks) If you use an inhaler, bring it with you to the test. Do not smoke for 4 hours before the test. Wear comfortable shoes and clothing.     Follow-Up: At  [facility], you and your health needs are our priority.  As part of our continuing mission to provide you with exceptional heart care, we have created designated Provider Care Teams.  These Care Teams include your primary Cardiologist (physician) and Advanced Practice Providers (APPs -  Physician Assistants and Nurse Practitioners) who all work together to provide you with the care you need, when you need it.     Your next appointment:    as needed  The format for your next appointment:   In Person  Provider:   Newman JINNY Lawrence, MD

## 2023-12-22 NOTE — Progress Notes (Signed)
 Specialty Pharmacy Refill Coordination Note  SUTTYN CRYDER is a 53 y.o. female contacted today regarding refills of specialty medication(s) Adalimumab  (Humira  (2 Pen))   Patient requested Pickup at [facility] at [place] date: 12/25/23   Medication will be filled on: 12/25/23        Clinical Intervention Note  Clinical Intervention Notes: Patient will begin a 10 day course of Xifaxin and neomycin , pending prior authorization approval. No DDIs identified with Humira .   Clinical Intervention Outcomes: Prevention of an adverse drug event   Advertising Account Planner

## 2023-12-22 NOTE — Progress Notes (Signed)
 " Cardiology Office Note:  .   Date:  12/22/2023  ID:  Karen [name], DOB [birth year], MRN [id number] PCP: Elliot Charm, MD  [facility] Providers Cardiologist:  Newman Lawrence, MD PCP: Elliot Charm, MD  Chief Complaint  Patient presents with   Karen [name]     Karen [name] is a 53 y.o. 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  She developed severe Karen [name] with associated shortness of breath after port-a-cath removal and replacement on December 01, 2023. Karen [name] and Karen occur with climbing stairs and have limited her activity for the past 2 to 3 weeks. She has no Karen [name] with flat walking. A Karen x-ray was obtained on December 07, 2023. She also has a long-standing lung nodule under surveillance.  She has rheumatoid [name] and Karen [name].  She has Karen [name] managed with serial phlebotomies through a port-a-cath under the care of Dr. Timmy  She had a blood clot at age 17 with Karen [name] of the left leg and now notes new swelling around the left ankle.  She has mixed Karen bowel symptoms and is in pelvic floor therapy.  Her father had a myocardial infarction at age 50. Her mother had mitral valve disease.  She does not smoke or drink alcohol. She lives in a two-story house and was previously walking regularly but has reduced activity due to Karen [name] and shortness of breath.  She reports recent labile blood pressure with readings up to 150/80-90, which is higher than usual for her and which she relates to [name] from  back problems.      Vitals:   12/22/23 1052  BP: 112/70  Pulse: 85  SpO2: 98%      Review of Systems  Cardiovascular:  Positive for Karen [name] and Karen [name]. Negative for leg swelling, palpitations and syncope.        Studies Reviewed: Karen [name]       EKG 12/22/2023: Normal sinus rhythm Minimal voltage criteria for LVH, may be normal variant ( R in aVL ) When compared with ECG of 08-Nov-2022 00:22, No significant change was found    Labs 12/2023: Hb 12.3 Cr 0.74, AlKP 132  2021: Chol 144, TG 82, HDL 44, LDL 84    Physical Exam Vitals and nursing note reviewed.  Constitutional:      General: She is not in acute distress. Neck:     Vascular: No JVD.  Cardiovascular:     Rate and Rhythm: Normal rate and regular rhythm.     Heart sounds: Normal heart sounds. No murmur heard. Pulmonary:     Effort: Pulmonary effort is normal.     Breath sounds: Normal breath sounds. No wheezing or rales.  Musculoskeletal:     Right lower leg: Edema (Trace) present.     Left lower leg: Edema (Trace) present.      VISIT DIAGNOSES:   ICD-10-CM   1. Precordial [name]  R07.2 EXERCISE TOLERANCE TEST (ETT)    Cardiac Stress Test: Informed Consent Details: Physician/Practitioner Attestation; Transcribe to consent form and obtain patient signature    2. Karen [name]  R06.09 EKG 12-Lead  ECHOCARDIOGRAM COMPLETE    3. Karen [name], unspecified Karen [name] type  E83.119 ECHOCARDIOGRAM COMPLETE       AMONIE WISSER is a 53 y.o. 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 Family history of early coronary artery disease, recent CT Karen with no coronary calcification.  Overall, low suspicion of angina. - Ordered treadmill exercise stress test. - Ordered  echocardiogram. - Advised heart-healthy diet and lifestyle modifications.  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 No symptoms of cardiac involvement. - Ordered echocardiogram for cardiac screening. - Continue phlebotomy with Dr. Timmy.  [name] of left lower extremity Continue management as per vascular surgery.  Patient blood pressure spikes not appreciated today.  Recommend keeping blood pressure log and following up with PCP.      Informed Consent   Shared Decision Making/Informed Consent The risks [Karen [name], shortness of breath, cardiac arrhythmias, dizziness, blood pressure fluctuations, myocardial infarction, stroke/transient ischemic attack, and life-threatening complications (estimated to be 1 in 10,000)], benefits (risk stratification, diagnosing coronary artery disease, treatment guidance) and alternatives of an exercise tolerance test were discussed in detail with Karen [name] and she agrees to proceed.       F/u as needed, unless significant abnormality noted on above testing  Signed, Newman JINNY Lawrence, MD  "

## 2023-12-25 ENCOUNTER — Other Ambulatory Visit (HOSPITAL_COMMUNITY): Payer: Self-pay

## 2023-12-25 ENCOUNTER — Telehealth: Payer: Self-pay

## 2023-12-25 ENCOUNTER — Ambulatory Visit

## 2023-12-25 ENCOUNTER — Ambulatory Visit: Admitting: Occupational Therapy

## 2023-12-25 ENCOUNTER — Encounter: Payer: Self-pay | Admitting: Physical Therapy

## 2023-12-25 ENCOUNTER — Ambulatory Visit: Admitting: Physical Therapy

## 2023-12-25 ENCOUNTER — Encounter: Payer: Self-pay | Admitting: Hematology & Oncology

## 2023-12-25 ENCOUNTER — Ambulatory Visit (HOSPITAL_COMMUNITY)
Admission: RE | Admit: 2023-12-25 | Discharge: 2023-12-25 | Disposition: A | Discharge status: Home / Self Care | Source: Ambulatory Visit | Attending: Cardiovascular Disease | Admitting: Cardiovascular Disease

## 2023-12-25 ENCOUNTER — Inpatient Hospital Stay
Admission: RE | Admit: 2023-12-25 | Discharge: 2023-12-25 | Attending: Obstetrics & Gynecology | Admitting: Obstetrics & Gynecology

## 2023-12-25 DIAGNOSIS — M62838 Other muscle spasm: Secondary | ICD-10-CM

## 2023-12-25 DIAGNOSIS — M542 Cervicalgia: Secondary | ICD-10-CM

## 2023-12-25 DIAGNOSIS — R072 Precordial pain: Secondary | ICD-10-CM | POA: Insufficient documentation

## 2023-12-25 DIAGNOSIS — M6281 Muscle weakness (generalized): Secondary | ICD-10-CM

## 2023-12-25 DIAGNOSIS — R102 Pelvic and perineal pain unspecified side: Secondary | ICD-10-CM

## 2023-12-25 DIAGNOSIS — R279 Unspecified lack of coordination: Secondary | ICD-10-CM

## 2023-12-25 DIAGNOSIS — Z1231 Encounter for screening mammogram for malignant neoplasm of breast: Secondary | ICD-10-CM

## 2023-12-25 DIAGNOSIS — M5459 Other low back pain: Secondary | ICD-10-CM

## 2023-12-25 NOTE — Therapy (Signed)
 " OUTPATIENT PHYSICAL THERAPY FEMALE PELVIC TREATMENT     Patient Name: Karen [name] MRN: [id number] DOB:1970-09-17, 53 y.o., female Today's Date: 12/25/2023  END OF SESSION:  PT End of Session - 12/25/23 0935     Visit Number [id number]   PFPT   Date for Recertification  03/26/24   PFPT   Authorization Type [name]    PT Start Time 0930    PT Stop Time 1010    PT Time Calculation (min) 40 min    Activity Tolerance Patient tolerated treatment well    Behavior During Therapy [facility] for tasks assessed/performed                      Past Medical History:  Diagnosis Date   Adenomyosis    Anemia    Arthritis 2013   L knee gets steroid injections    Arthritis, rheumatic, acute or subacute    Asthmatic bronchitis 01/31/2017   Back pain    Chronic constipation    Diarrhea    DVT of lower extremity (deep venous thrombosis) (HCC)    Dysmenorrhea    Endometriosis    Esophageal stricture    G6PD deficiency    GERD (gastroesophageal reflux disease)    History of kidney stones    History of uterine fibroid    IBS (irritable bowel syndrome)    Interstitial cystitis    Lactose intolerance    Migraine    with aura   Other hemochromatosis 06/10/2021   PONV (postoperative nausea and vomiting)    Recurrent upper respiratory infection (URI)    Sebaceous cyst of breast, right lower inner quadrant 10/25/2012   Excised 11/21/12    Sleep apnea    Syncope, non cardiac    Past Surgical History:  Procedure Laterality Date   ARTHROSCOPIC HAGLUNDS REPAIR     BREAST CYST EXCISION Right 11/21/2012   Procedure: CYST EXCISION BREAST;  Surgeon: Sherlean JINNY Laughter, MD;  Location: [facility];  Service: General;  Laterality: Right;   BREAST CYST EXCISION Bilateral 01/18/2022   Procedure: EXCISION OF SEBACEOUS CYST BILATERAL BREAST;  Surgeon: Vanderbilt Ned, MD;  Location: [facility];  Service: General;  Laterality: Bilateral;   BREAST EXCISIONAL  BIOPSY Right 11/2012   CESAREAN SECTION  04/19/1993   CHOLECYSTECTOMY     COLONOSCOPY  05/02/2011   Procedure: COLONOSCOPY;  Surgeon: Lynwood LITTIE Celestia Mickey., MD;  Location: [facility];  Service: Endoscopy;  Laterality: N/A;   colonoscopy  11/04/2014   DENTAL SURGERY  03/06/2018   2 surgeries ( 03/06/2018 and 04/06/2018)   to remove two separate benign tumors   ESOPHAGEAL MANOMETRY N/A 11/04/2015   Procedure: ESOPHAGEAL MANOMETRY (EM);  Surgeon: Lynwood Celestia, MD;  Location: [facility];  Service: Endoscopy;  Laterality: N/A;   ESOPHAGOGASTRODUODENOSCOPY  05/02/2011   Procedure: ESOPHAGOGASTRODUODENOSCOPY (EGD);  Surgeon: Lynwood LITTIE Celestia Mickey., MD;  Location: [facility];  Service: Endoscopy;  Laterality: N/A;   ESOPHAGOGASTRODUODENOSCOPY N/A 09/01/2014   Procedure: ESOPHAGOGASTRODUODENOSCOPY (EGD);  Surgeon: Lynwood Celestia, MD;  Location: [facility];  Service: Endoscopy;  Laterality: N/A;   IR CV LINE INJECTION  09/13/2023   IR CV LINE INJECTION  12/01/2023   IR IMAGING GUIDED PORT INSERTION  07/06/2023   IR IMAGING GUIDED PORT INSERTION  12/01/2023   IR REMOVAL TUN ACCESS W/ PORT W/O FL MOD SED  12/01/2023   KNEE SURGERY     LAPAROSCOPY     x 4   LESION  REMOVAL N/A 01/18/2022   Procedure: EXCISION OF SEBACEOUS CYSTS CHEST AND NECK;  Surgeon: Vanderbilt Ned, MD;  Location: [facility];  Service: General;  Laterality: N/A;   PH IMPEDANCE STUDY N/A 11/04/2015   Procedure: PH IMPEDANCE STUDY;  Surgeon: Lynwood Bohr, MD;  Location: [facility];  Service: Endoscopy;  Laterality: N/A;   VAGINAL HYSTERECTOMY  2010   TVH--ovaries remain   Patient Active Problem List   Diagnosis Date Noted   Sjogren syndrome with keratoconjunctivitis 12/09/2022   Hemochromatosis 06/10/2021   Elevated LFTs 04/04/2021   Colitis 04/04/2021   Bacterial overgrowth syndrome 05/21/2020   Obesity 05/21/2020   History of endometriosis 05/21/2020   Constipation due to outlet dysfunction 02/05/2020    Gastroesophageal reflux disease 02/05/2020   Obstructive sleep apnea treated with continuous positive airway pressure (CPAP) 06/16/2017   Perennial allergic rhinitis with a nonallergic component 01/31/2017   Asthmatic bronchitis 01/31/2017   GI symptoms 01/31/2017   Family history of colon cancer 01/27/2015   Chest pain 12/13/2012   Dyspnea 12/13/2012   DVT of lower extremity (deep venous thrombosis) (HCC) 01/03/1990    PCP: Elliot Charm, MD  REFERRING PROVIDER: Cleotilde Ronal RAMAN, MD   REFERRING DIAG: M62.89 (ICD-10-CM) - Pelvic floor dysfunction  THERAPY DIAG:  Muscle weakness (generalized)  Other muscle spasm  Pelvic pain  Unspecified lack of coordination  Rationale for Evaluation and Treatment: Rehabilitation  ONSET DATE: chronic  SUBJECTIVE:                                                                                                                                                                                           SUBJECTIVE STATEMENT:  Pt states that she is going to go ahead and have treatment for SIBO. She states that her white blood count is up compared to 2 weeks ago and they are not sure why. Her husband is here for training on different bowel mobilization techniques. She states that bowel movements are overall the same and moving.   PAIN: 12/25/23 Are you having pain? Yes NPRS scale: 3/10 Pain location: Lower abdominal pain, Lt sided pain, low back pain  Pain type: turning, knotted, pressure Pain description: intermittent and constant   Aggravating factors: constipation or frequent bowel movements/constantly having bowel movements  Relieving factors: exercises (bowel massage, happy baby, spinal twists, walking)  PRECAUTIONS: None  WEIGHT BEARING RESTRICTIONS: No  FALLS:  Has patient fallen in last 6 months? No  LIVING ENVIRONMENT: Lives with: lives with their spouse Lives in: House/apartment  OCCUPATION: nurse, in admin  PLOF:  Independent  PATIENT GOALS: decrease pain and have more normal bowel movements  PERTINENT HISTORY:  Vaginal hysterectomy, DVT LE, endometriosis, interstitial cystitis, exploratory laps for endometriosis, c-section, cholecystectomy, RA diagnosed 06/2023 Sexual abuse: No  BOWEL MOVEMENT: Pain with bowel movement: Yes Type of bowel movement:Type (Bristol Stool Scale) 1-7 (sometimes full range in the same bowel movement), Frequency sometimes many times a day, sometimes up to 4 days in between, and Strain Yes Fully empty rectum: No Leakage: No Pads: No Fiber supplement: No - has attempted metamucil and it made constipation worse  URINATION: Pain with urination: Yes Fully empty bladder: No Stream: varies  Urgency: Yes: all the time Frequency: multiple times an hours  Leakage: Urge to void, Walking to the bathroom, Coughing, Sneezing, Laughing, and Exercise Pads: Yes: daily  INTERCOURSE: Pain with intercourse: Initial Penetration and During Penetration Ability to have vaginal penetration:  Yes: with pain Climax: non painful WNL   PREGNANCY: Vaginal deliveries 1 Tearing Yes: 3rd degree tear C-section deliveries 1 Currently pregnant No  PROLAPSE: Periodically will feel vaginal bulging, heaviness in lower abdomen   OBJECTIVE:  10/10/23 Curl-up test: Minimal abdominal distortion focused in upper abdominals, largely improved Transversus abdominus: good core facilitation with exhale Sit-up test: 1/3  LUMBARAROM/PROM:  A/PROM A/PROM  Eval (% available) 11/09/22 (% available) 04/25/23 (% available) 10/10/23 (% available)  Flexion 50 90 90 100  Extension 25, pain anteriorly  25, pain in low back 50 75  Right lateral flexion 50, pain anteriorly  75, pain on right 50, pain on Rt 75  Left lateral flexion 50, pain anteriorly  75, pain on right 75 75   (Blank rows = not tested)    05/04/23:               Internal Pelvic Floor: mild tenderness in bil levator ani, but no increase in  tension; with internal rectal exam, she was demonstrating paradoxical contraction when trying to eccentrically lengthen with exhale and had very hard time correcting  Patient confirms identification and approves PT to assess internal pelvic floor and treatment Yes  PELVIC MMT:   MMT eval  Vaginal 2/5, 3 second hold, 3 repeat contractions  Internal Anal Sphincter 1/5  External Anal Sphincter 2/5  Puborectalis 1/5  Diastasis Recti   (Blank rows = not tested)        TONE: WNL   PROLAPSE: Grade 2 anterior vaginal wall laxity  04/25/23: Curl-up test: abdominal distortion Transversus abdominus: week, difficulty getting active contraction (not been working on strengthening)    LUMBARAROM/PROM:  A/PROM A/PROM  Eval (% available) 11/09/22 (% available) 04/25/23 (% available)  Flexion 50 90 90  Extension 25, pain anteriorly  25, pain in low back 50  Right lateral flexion 50, pain anteriorly  75, pain on right 50, pain on Rt  Left lateral flexion 50, pain anteriorly  75, pain on right 75   (Blank rows = not tested)  11/09/22: No internal rectal or vaginal pelvic floor exam performed due to high level of skin irritation   Weak transversus abdominus contraction  Abdominal restriction and tenderness in bil lower quadrant  Unable to perform pelvic tilt with appropriate coordination  LUMBARAROM/PROM:  A/PROM A/PROM  Eval (% available) 11/09/22 (% available)  Flexion 50 90  Extension 25, pain anteriorly  25, pain in low back  Right lateral flexion 50, pain anteriorly  75, pain on right  Left lateral flexion 50, pain anteriorly  75, pain on right   (Blank rows = not tested) 07/21/22: standing prolapse assessment demonstrated grade 2 anterior vaginal wall laxity  06/08/22:               External Perineal Exam: WNL                              Internal Pelvic Floor: burning reported with palpation of superficial muscles; deep aching/pressure with palpation of Lt levator ani   Patient  confirms identification and approves PT to assess internal pelvic floor and treatment Yes  PELVIC MMT:   MMT eval  Vaginal 3/5  Internal Anal Sphincter 1/5  External Anal Sphincter 2/5  Puborectalis 1/5  Diastasis Recti   (Blank rows = not tested)        TONE: High in Lt levator ani   PROLAPSE: Not able to tell this session due to dyssynergic pelvic floor contraction     06/01/22: COGNITION: Overall cognitive status: Within functional limits for tasks assessed     SENSATION: Light touch: Appears intact Proprioception: Appears intact  GAIT: Comments: decreased hip extension, forward flexed trunk  POSTURE: rounded shoulders, forward head, decreased lumbar lordosis, increased thoracic kyphosis, posterior pelvic tilt, and flexed trunk    LUMBARAROM/PROM:  A/PROM A/PROM  Eval (% available)  Flexion 50  Extension 25, pain anteriorly   Right lateral flexion 50, pain anteriorly   Left lateral flexion 50, pain anteriorly    (Blank rows = not tested)   PALPATION:   General  Significant abdominal restriction and tenderness; decreased rib mobility with mobilization and breathing    TODAY'S TREATMENT:                                                                                                                              DATE:  12/25/23 Manual: Supine: ILU bowel mobilization  Abdominal scar tissue mobilization Ileocecal valve mobilization External rectum mobilization Diaphragm release ILU mobilization Prone: Myofascial release to low back Soft tissue mobilization to lumbar paraspinals and glutes  Therapeutic activities: Partner education on bowel mobilization techniques    12/20/23 Manual: Supine: ILU bowel mobilization  Abdominal scar tissue mobilization Ileocecal valve mobilization External rectum mobilization Diaphragm release Therapeutic activities: Squats to table 2 x 10 3 way kick 10x each bil Side stepping + blue loop 3 laps  Heel raises 3 x  10 Staggered deadlift and pt education through bending in this position when performing functional tasks 10x bil   12/14/23 Manual: Supine: ILU bowel mobilization  Abdominal scar tissue mobilization Ileocecal valve mobilization External rectum mobilization Diaphragm release  Neuromuscular re-education: Seated hip internal rotation + red band + block 10x  Seated hip abduction + red band 2 x 10 Seated unilateral hip abduction + red band 10x bil Seated march + red band 2 x 10 Therapeutic activities: Squats + 5 lbs to table 2 x 10 3 way kick 10x each, bil Pt education performed on discharge planning, independence with HEP, teaching husband manual techniques to perform at home  PATIENT EDUCATION:  Education details: See above Person educated: Patient Education method: Explanation, Demonstration, Tactile cues, Verbal cues, and Handouts Education comprehension: verbalized understanding  HOME EXERCISE PROGRAM: [id number]  ASSESSMENT:  CLINICAL IMPRESSION: We have continued discharge planning and are planning for our last visit to be 01/16/24. Husband is here for training on bowel mobilization techniques in order to help prepare for discharge. Due to this, we focused on manual techniques this session. Husband and patient report that they feel comfortable with implementing these techniques at home. They were able to video all techniques on their phone in order to have reference. She will continue to benefit from skilled PT intervention in order to decrease pain, improve bowel movements, and promote independence with HEP.     OBJECTIVE IMPAIRMENTS: decreased activity tolerance, decreased coordination, decreased endurance, decreased mobility, decreased strength, increased fascial restrictions, increased muscle spasms, impaired tone, postural dysfunction, and pain.   ACTIVITY LIMITATIONS: lifting, bending, continence, and locomotion level  PARTICIPATION LIMITATIONS: interpersonal  relationship, community activity, and occupation  PERSONAL FACTORS: 1 comorbidity: medical history are also affecting patient's functional outcome.   REHAB POTENTIAL: Good  CLINICAL DECISION MAKING: Stable/uncomplicated  EVALUATION COMPLEXITY: Low   GOALS: Goals reviewed with patient? Yes  SHORT TERM GOALS: Updated 10/10/23  Pt will be independent with HEP.   Baseline: Goal status: MET 07/12/22  2.  Pt will be independent with diaphragmatic breathing and down training activities in order to improve pelvic floor relaxation.  Baseline:  Goal status: MET 07/12/22  3.  Pt will be independent with the knack, urge suppression technique, and double voiding in order to improve bladder habits and decrease urinary incontinence.   Baseline:  Goal status: MET 09/22/22  4.  Pt will be independent with use of squatty potty, relaxed toileting mechanics, and improved bowel movement techniques in order to increase ease of bowel movements and complete evacuation.   Baseline:  Goal status: MET 11/09/22  5.  Pt will be able to correctly perform diaphragmatic breathing and appropriate pressure management in order to prevent worsening vaginal wall laxity and improve pelvic floor A/ROM.   Baseline:  Goal status: MET 01/18/23  LONG TERM GOALS: Updated 10/10/23  Pt will be independent with advanced HEP.   Baseline:  Goal status: IN PROGRESS 12/20/23  2.  Pt will demonstrate normal pelvic floor muscle tone and A/ROM, able to achieve 4/5 strength with contractions and 10 sec endurance, in order to provide appropriate lumbopelvic support in functional activities.   Baseline: not assessed today per patient request - no current bladder issues and they have all resolved Goal status: IN PROGRESS 12/20/23  3.  Pt will increase all impaired lumbar A/ROM by 25% without pain.  Baseline: Pt seen a little bit of loss in some and improvement in extension Goal status:  IN PROGRESS 12/20/23  4.  Pt will  report pain no higher than 3/10 with any activity. Baseline: pain currently 3/10, worst is an 8/10 and often corresponds to after she is eating Goal status:  IN PROGRESS 12/20/23  5.  Pt will report 0/10 pain with vaginal penetration in order to improve intimate relationship with partner.    Baseline: no recent intercourse attempts  Goal status:  IN PROGRESS 12/20/23  6.  Pt will be able to go 2-3 hours in between voids without urgency or incontinence in order to improve QOL and perform all functional activities with less difficulty.   Baseline:  Goal status: MET 02/21/23  7.  Pt will report  no leaks with laughing, coughing, sneezing in order to improve comfort with interpersonal relationships and community activities.   Baseline: Pt states that she feels 30-40% better Goal status:  MET 10/10/23  8.  Pt will have consistent bowel movements 4-5x/week without straining or pain.  Baseline:  Goal status:  MET 02/21/23  PLAN:  PT FREQUENCY: 1-2x/week  PT DURATION: 6 months  PLANNED INTERVENTIONS: Therapeutic exercises, Therapeutic activity, Neuromuscular re-education, Balance training, Gait training, Patient/Family education, Self Care, Joint mobilization, Dry Needling, Biofeedback, and Manual therapy  PLAN FOR NEXT SESSION: plan for 01/16/24 to be last visit.   Josette Mares, PT, DPT12/22/202510:13 AM "

## 2023-12-25 NOTE — Patient Instructions (Signed)
 [facility]  Welcome to [facility]! Here you will find all the information you will need regarding your pool therapy. If you have further questions at any time, please call our office at [phone]. After completing your initial evaluation in the [facility], you may be eligible to complete a portion of your therapy in the pool. A typical week of therapy will consist of 1-2 typical physical therapy visits at our [facility] location and an additional session of therapy in the pool located at the [facility] at [facility]. [address], [place] [address]. The phone number at the pool site is [phone]. Please call this number if you are running late or need to cancel your appointment.  Check-in on MyChart then meet your therapist at the pool deck. (If you can't access MyChart, you may check in with the therapist at the pool deck.)   Each session will last approximately 45 minutes. All scheduling and payments for aquatic therapy sessions, including cancelations, will be done through our [facility] location.  To be eligible for aquatic therapy, these criteria must be met: You must be able to independently change in the locker room and get to the pool deck. A caregiver can come with you to help if needed however they do need to be the same sex to enter the locker room. Or you may change in a bathroom privately with opposite sex caregiver if needed. There are benches for a caregiver to sit on next to the pool.  Handicap parking is available in the front and there is a drop off option for even closer accessibility.  Please arrive 15 minutes prior to your appointment to prepare for your pool session. You must sign in at the front desk upon your arrival. Please be sure to attend to any toileting needs prior to entering the pool. Locker rooms for changing are available.  There is direct access to the pool deck from the locker  room. You can lock your belongings in a locker or bring them with you poolside. Your therapist will greet you on the pool deck. There may be other swimmers in the pool at the same time but your session is one-on-one with the therapist.    What to Expect Arrive 15 min early for your appointment and check in with rehab front desk. Please limit use of body lotions and hair products before entering the pool. Locker rooms are available for showering, changing and toileting. Appointments are 45 minutes with your therapist. (This does not include changing times) The pool is approximately 500 feet from the nearest parking lot. There are benches and chairs along the walk. Please bring a support person if you need assistance traveling the distanceto the pool or assistance with changing/toileting. Stairs with handrails as well as a lift chair are available at the pool.  Depth is 3'6"-4'8" and temperature is between 88-90 degrees. The pool deck is tile flooring and gets slippery, water  shoes are strongly encouraged but not required. Please wear a bathing suit or athletic shorts and a t-shirt. Recommended to bring your own towel. Severe weather:Thunder or lightning results in closure of the pool deck for 30 minutes and is extended with each incidence. Your appointment may be moved to land or canceled with the option to reschedule. Tell your therapist if you have any of the following: Open wounds Active infection Fear of water  Bowel or bladder incontinence  Benefits of Aquatic Therapy:  Reduces Stress on Joints  and Muscles  The buoyancy of water  supports body weight, making movement easier and less painful.  Builds Strength and Stability  The viscosity of water  provides resistance that allows individuals to strengthen muscles while also providing a safe environment to improve balance and coordination.  Promotes Relaxation  The warm water  and the feeling of being supported can help reduce stress and be  beneficial for overall well-being.

## 2023-12-25 NOTE — Therapy (Signed)
 " OUTPATIENT PHYSICAL THERAPY CERVICAL & LUMBAR TREATMENT/ RE-CERTIFICATION   Patient Name: TIFANI DACK MRN: [id number] DOB:07/01/1970, 53 y.o., female Today's Date: 12/25/2023  END OF SESSION:  PT End of Session - 12/25/23 0952     Visit Number 23    Date for Recertification  02/19/24    Authorization Type [name] Employee    PT Start Time [phone]    PT Stop Time 0930    PT Time Calculation (min) 43 min    Activity Tolerance Patient tolerated treatment well    Behavior During Therapy [facility] for tasks assessed/performed                       Past Medical History:  Diagnosis Date   Adenomyosis    Anemia    Arthritis 2013   L knee gets steroid injections    Arthritis, rheumatic, acute or subacute    Asthmatic bronchitis 01/31/2017   Back pain    Chronic constipation    Diarrhea    DVT of lower extremity (deep venous thrombosis) (HCC)    Dysmenorrhea    Endometriosis    Esophageal stricture    G6PD deficiency    GERD (gastroesophageal reflux disease)    History of kidney stones    History of uterine fibroid    IBS (irritable bowel syndrome)    Interstitial cystitis    Lactose intolerance    Migraine    with aura   Other hemochromatosis 06/10/2021   PONV (postoperative nausea and vomiting)    Recurrent upper respiratory infection (URI)    Sebaceous cyst of breast, right lower inner quadrant 10/25/2012   Excised 11/21/12    Sleep apnea    Syncope, non cardiac    Past Surgical History:  Procedure Laterality Date   ARTHROSCOPIC HAGLUNDS REPAIR     BREAST CYST EXCISION Right 11/21/2012   Procedure: CYST EXCISION BREAST;  Surgeon: Sherlean JINNY Laughter, MD;  Location: [facility];  Service: General;  Laterality: Right;   BREAST CYST EXCISION Bilateral 01/18/2022   Procedure: EXCISION OF SEBACEOUS CYST BILATERAL BREAST;  Surgeon: Vanderbilt Ned, MD;  Location: [facility];  Service: General;  Laterality: Bilateral;   BREAST  EXCISIONAL BIOPSY Right 11/2012   CESAREAN SECTION  04/19/1993   CHOLECYSTECTOMY     COLONOSCOPY  05/02/2011   Procedure: COLONOSCOPY;  Surgeon: Lynwood LITTIE Celestia Mickey., MD;  Location: [facility];  Service: Endoscopy;  Laterality: N/A;   colonoscopy  11/04/2014   DENTAL SURGERY  03/06/2018   2 surgeries ( 03/06/2018 and 04/06/2018)   to remove two separate benign tumors   ESOPHAGEAL MANOMETRY N/A 11/04/2015   Procedure: ESOPHAGEAL MANOMETRY (EM);  Surgeon: Lynwood Celestia, MD;  Location: [facility];  Service: Endoscopy;  Laterality: N/A;   ESOPHAGOGASTRODUODENOSCOPY  05/02/2011   Procedure: ESOPHAGOGASTRODUODENOSCOPY (EGD);  Surgeon: Lynwood LITTIE Celestia Mickey., MD;  Location: [facility];  Service: Endoscopy;  Laterality: N/A;   ESOPHAGOGASTRODUODENOSCOPY N/A 09/01/2014   Procedure: ESOPHAGOGASTRODUODENOSCOPY (EGD);  Surgeon: Lynwood Celestia, MD;  Location: [facility];  Service: Endoscopy;  Laterality: N/A;   IR CV LINE INJECTION  09/13/2023   IR CV LINE INJECTION  12/01/2023   IR IMAGING GUIDED PORT INSERTION  07/06/2023   IR IMAGING GUIDED PORT INSERTION  12/01/2023   IR REMOVAL TUN ACCESS W/ PORT W/O FL MOD SED  12/01/2023   KNEE SURGERY     LAPAROSCOPY     x 4   LESION REMOVAL N/A 01/18/2022  Procedure: EXCISION OF SEBACEOUS CYSTS CHEST AND NECK;  Surgeon: Vanderbilt Ned, MD;  Location: [facility];  Service: General;  Laterality: N/A;   PH IMPEDANCE STUDY N/A 11/04/2015   Procedure: PH IMPEDANCE STUDY;  Surgeon: Lynwood Bohr, MD;  Location: [facility];  Service: Endoscopy;  Laterality: N/A;   VAGINAL HYSTERECTOMY  2010   TVH--ovaries remain   Patient Active Problem List   Diagnosis Date Noted   Sjogren syndrome with keratoconjunctivitis 12/09/2022   Hemochromatosis 06/10/2021   Elevated LFTs 04/04/2021   Colitis 04/04/2021   Bacterial overgrowth syndrome 05/21/2020   Obesity 05/21/2020   History of endometriosis 05/21/2020   Constipation due to outlet dysfunction  02/05/2020   Gastroesophageal reflux disease 02/05/2020   Obstructive sleep apnea treated with continuous positive airway pressure (CPAP) 06/16/2017   Perennial allergic rhinitis with a nonallergic component 01/31/2017   Asthmatic bronchitis 01/31/2017   GI symptoms 01/31/2017   Family history of colon cancer 01/27/2015   Chest pain 12/13/2012   Dyspnea 12/13/2012   DVT of lower extremity (deep venous thrombosis) (HCC) 01/03/1990    PCP: Elliot Charm, MD   REFERRING PROVIDER:   Duwayne Purchase, MD    REFERRING DIAG:  Diagnosis  M50.30 (ICD-10-CM) - Other cervical disc degeneration, unspecified cervical region  Aqua therapy Evaluate and treat cervical and lumbar spine 2-3wk and 3-4 weeks DDD (cervical/lumbar)   THERAPY DIAG:  Cervicalgia  Other low back pain  Muscle weakness (generalized)  Other muscle spasm  Rationale for Evaluation and Treatment: Rehabilitation  ONSET DATE: July 2025  SUBJECTIVE:                                                                                                                                                                                                         SUBJECTIVE STATEMENT: Patient reports she has been tired all weekend. Pain is good today. 2/10. She had an facet injections on Thursday in the back. She noted much improved in numbness and tingling in left leg. Back pain is aggravated with sidebending, bending to pick up objections. She mainly has numbness and tingling in left leg and occasionally right leg.     Eval: Reports onset of L UT pain following port placement into R shoulder Hand dominance: Right  PERTINENT HISTORY:  07/21/2023 Assessment: - suspect left-sided cervical/ trapezius muscle spasm - reassured by absence of red flag signs/ symptoms at this time   Concurrent lymphedema PAIN:  Are you having pain? Yes: NPRS scale: 2/10 Pain location: L UT Pain description: ache, cramp Aggravating factors:  activity  Relieving factors: heat, medication  PRECAUTIONS: None  RED FLAGS: None     WEIGHT BEARING RESTRICTIONS: No  FALLS:  Has patient fallen in last 6 months? No  OCCUPATION: [employer] Employee  PLOF: Independent  PATIENT GOALS: To manage my neck pain  NEXT MD VISIT: TBD  OBJECTIVE:  Note: Objective measures were completed at Evaluation unless otherwise noted.  DIAGNOSTIC FINDINGS:  Oct 2025 C3-4 mod bulging disc osteophyte C4-5 mod to severe bulging disc osteophyte C5-6 severe bulging osteophyte C6-7 minimal retrolisthesis of c6 and C7 with severe bulging disc T2-32mod left disc protrusion  subarticular  12/2 MRI lumbar & cervical  MRI cervical spine demonstrates C6-C7 moderately severe left and moderate right foraminal stenosis. Endplate Modic changes are noted. T2-T3 partially visualized moderate left subarticular disc protrusion with impingement upon and probable minimal deformity of the left lateral cord. Likely impinging on the left T3 nerve root.  X-rays of the cervical spine demonstrate cervical spondylosis predominantly at C4-5 C5-6 and C6-7. No subluxation at C1-C2.   X-rays lumbar spine demonstrates a mild dextrorotatory scoliosis. There is disc degeneration L5-S1 remainder obscured by barium.   MRI lumbar spine degenerative spinal listhesis L4-5 with severe facet arthrosis bulging disc mild stenosis. Mild degenerative changes small disc at L5-S1 and L3-4.    PATIENT SURVEYS:  NDI: 28/50 56% perceived disability  Minimum Detectable Change (90% confidence): 5 points or 10% points  10/03/23:  NDI  30%   The Patient-Specific Functional Scale  Initial:  I am going to ask you to identify up to 3 important activities that you are unable to do or are having difficulty with as a result of this problem.  Today are there any activities that you are unable to do or having difficulty with because of this?  (Patient shown scale and patient rated each  activity)  Follow up: When you first came in you had difficulty performing these activities.  Today do you still have difficulty?  Patient-Specific activity scoring scheme (Point to one number):  0 1 2 3 4 5 6 7 8 9  10 Unable                                                                                                          Able to perform To perform                                                                                                    activity at the same Activity         Level as before  Injury or problem  Activity       Computer work 1 hour                                     9/30: 5                     11/12  5       12/22:6  2.          Cooking                                                           9/30: 5                  12/22:  6                                                                              3.         Sit and watch TV                                              9/30: 5          12/22:6                                                              12/25/2023 ODI:15/50 30% NDI:21/50 42%  POSTURE: rounded shoulders and forward head  PALPATION: Global tenderness throughout B UT and posterior shoulder girdle   CERVICAL ROM:   Active ROM A/PROM (deg) eval 9/30 11/12 12/22  Flexion 75%P! 47 50 30*  Extension 75%P! 45 46 32*  Right lateral flexion 75%P! 36 43 30  Left lateral flexion 75%P! 29 31 30   Right rotation 75%P! 35 40 60  Left rotation 75%P! 29 22 50   (Blank rows = not tested)  LUMBAR ROM: *painful  Active ROM 12/22  Flexion Bend to shins*  Extension 80% limited*  Right lateral flexion Bends to above knee joint*  Left lateral flexion Bends past knee joint*  Right rotation Full*  Left rotation Full*   (Blank rows = not tested)   UPPER EXTREMITY ROM:  Active ROM Right eval Left eval 9/30 11/12  Shoulder  flexion 150 150 L 155  R 151   Shoulder extension      Shoulder abduction 150 150 L 167 R 161 R 150 L 155  Shoulder adduction      Shoulder extension      Shoulder internal rotation      Shoulder external rotation      Elbow flexion  Elbow extension      Wrist flexion      Wrist extension      Wrist ulnar deviation      Wrist radial deviation      Wrist pronation      Wrist supination       (Blank rows = not tested)  UPPER EXTREMITY MMT:  MMT Right eval Left eval 9/30 11/12  Shoulder flexion      Shoulder extension      Shoulder abduction      Shoulder adduction      Shoulder extension      Shoulder internal rotation      Shoulder external rotation      Middle trapezius   Bil 4-/5 Bil 4/5  Lower trapezius   Bil 4-/5 Bil 4/5  Elbow flexion      Elbow extension      Wrist flexion      Wrist extension      Wrist ulnar deviation      Wrist radial deviation      Wrist pronation      Wrist supination      Grip strength       (Blank rows = not tested)  LOWER  EXTREMITY MMT: Mu:Hmnddob 4-/4+  Lt: hip flexion & knee extension 4-; all other motions grossly 4/5  CERVICAL SPECIAL TESTS:  Neck flexor muscle endurance test: Negative 9/30 decreased cervical extensor strength 4-/5 11/12: deep cervical flexors and exensors 4/5  FUNCTIONAL TESTS:  5STS:17.31 sec hands on knees (uncomfortable, weakness on Lt )   TREATMENT  12/25/2023 Lumbar Assessment, NDI,ODI,PSFS (see above) Goal Assessment and plan for the next 8 weeks    12/20/2023 Patient reports she has been implementing hip hinge with function Plan to do ERO next visit (pt has new referral from Dr. Duwayne to include Low back pain and initiation of aquatic PT) HEP update with areas of focus Cervical ROM in all planes 3-5 reps each (Added to HEP- see below) Shoulder supine flexion 3-5 reps (Added to HEP- see below) Seated or standing shoulder abduction 3-5 reps (Added to HEP- see below) Manual:  grade 2  cervical distraction 3x 10; gentle upper trap lengthening right/left; suboccipital release; rotation mobs C2-4, lateral side glides C4-7 right/left grade 2/3; upper cervical flexion mob grade 2/3 10x  12/13/2023 Discussion of status including new port an allergic reaction Plan to do ERO 12/23 (pt has new referral from Dr. Duwayne to include Low back pain and initiation of aquatic PT) Body mechanics with standing: foot prop  Hip hinge with dowel and 3 points of contact and neutral spine alignment10x Hip hinge for functional movement to retrieve pots/pans and pick up objects from the floor Manual:  grade 2 cervical distraction 3x 10; gentle upper trap lengthening right/left; suboccipital release; rotation mobs C2-4, lateral side glides C4-7 right/left grade 2/3; upper cervical flexion mob grade 2/3 10x  12/06/2023 Lengthy discussion of imaging findings & plan for therapy Manual; patient seated: cervical distraction x 5 10 sec holds ; STM to bilateral upper trap & suboccipitals  Education on benefits of mechanical traction and how it works    11/28/2023 Standing deep cervical nods x 10 at wall Standing deep cervical against purple ball x 10 Standing deep cervical nods on wall with shoulder press against wall (pressing into purple ball) 10x, bil UE elevation 2x5;  Thoracic extension against purple ball x 8 Thoracic rotation with purple ball at back x 5 each direction Lumbar extension at  wall x 5  Provided exercises to help with radicular symptoms. Patient verbalized relief.  Manual:  grade 2 cervical distraction 3x 10; gentle upper trap lengthening right/left; suboccipital release; rotation mobs C2-4, lateral side glides C4-7 right/left grade 2/3                                                                                        PATIENT EDUCATION:  Education details: Discussed eval findings, rehab rationale and POC and patient is in agreement  Person educated: Patient Education method:  Explanation and Handouts Education comprehension: verbalized understanding and needs further education  HOME EXERCISE PROGRAM: Access Code: [email or web address] URL: [email or web address] Date: 12/20/2023 Prepared by: Glade Pesa  Exercises - Seated Shoulder Shrugs  - 2 x daily - 5 x weekly - 2 sets - 10 reps - Seated Scapular Retraction  - 2 x daily - 5 x weekly - 2 sets - 10 reps - Gentle Levator Scapulae Stretch  - 1 x daily - 7 x weekly - 1 sets - 2 reps - 30 seconds hold - Seated Cervical Sidebending Stretch  - 1 x daily - 7 x weekly - 1 sets - 2 reps - 30 seconds hold - Quadricep Stretch with Chair and Counter Support  - 1 x daily - 7 x weekly - 2 sets - 20-30s hold - Standing Hamstring Stretch with Step  - 1 x daily - 7 x weekly - 2 sets - 20-30s hold - Seated Hamstring Stretch  - 1 x daily - 7 x weekly - 2 sets - 20-30 hold - Seated Hip Flexor Stretch  - 1 x daily - 7 x weekly - 2 sets - 20-30 hold - Seated [place] Stretch with Towel  - 1 x daily - 7 x weekly - 2 sets - 20 reps - Seated Modified Small Range Crunch in Chair  - 1 x daily - 7 x weekly - 1-2 sets - 10 reps - Standing Marching  - 1 x daily - 7 x weekly - 1 sets - 10 reps - Median Nerve Flossing - Tray  - 1 x daily - 7 x weekly - 1 sets - 10 reps - Seated Cervical Traction  - 1 x daily - 7 x weekly - 1 sets - 10 reps - Seated Neck and Traction  - 1 x daily - 7 x weekly - 1 sets - 10 reps - Standing Low Shoulder Row with Anchored Resistance  - 1 x daily - 7 x weekly - 1 sets - 10 reps - Seated Thoracic Flexion and Rotation with Arms Crossed  - 1 x daily - 7 x weekly - 1 sets - 5 reps - Seated Thoracic Lumbar Extension  - 1 x daily - 7 x weekly - 1 sets - 5-10 reps - Supine Head Nod with Deep Neck Flexor Activation  - 1 x daily - 7 x weekly - 1 sets - 10 reps - Seated Deep Neck Flexor Nods  - 1 x daily - 7 x weekly - 1 sets - 10 reps - Standing Lumbar Extension at Wall - Forearms  - 1 x daily - 7 x weekly -  1 sets -  5-8 reps - 2s hold - Lying Prone  - 1 x daily - 7 x weekly - 3-26m hold - Lying Prone with 1 Pillow  - 1 x daily - 7 x weekly - 3-17m hold - Prone Press Up On Elbows  - 1 x daily - 7 x weekly - 1 sets - 5-8 reps - 3-5s hold - Seated Cervical Rotation AROM  - 1 x daily - 7 x weekly - 1 sets - 3-5 reps - Seated Cervical Flexion AROM  - 1 x daily - 7 x weekly - 1 sets - 3-5 reps - Seated Cervical Sidebending AROM  - 1 x daily - 7 x weekly - 1 sets - 3-5 reps - Seated Cervical Extension AROM  - 1 x daily - 7 x weekly - 1 sets - 3-5 reps - Supine Shoulder Flexion AAROM with Hands Clasped  - 1 x daily - 7 x weekly - 1 sets - 3-5 reps - Seated Shoulder Abduction and External Rotation AROM  - 1 x daily - 7 x weekly - 1 sets - 3-5 reps ASSESSMENT:  CLINICAL IMPRESSION: Re-certification completed today. Amesha continues to verbalized low neck pain levels since the port has been moved to the other side. She has also noted less swelling on the right side. She got lumbar facet injections on Thursday and we will see how she responses to those. She reports less numbness and tingling in her left leg since getting the injection. Noted improved scoring on all PSFS activities and improved cervical rotation ROM. Added back pain as a diagnosis today so completed assessment. Noted decreased ROM, muscle weakness, and increased muscle spasms. In previous sessions PT has explained imaging results. Created new goals with patient to meet over the next 8 weeks. Patient to start aquatics next week. Patient will benefit from skilled PT to address the below impairments and improve overall function.      OBJECTIVE IMPAIRMENTS: decreased activity tolerance, decreased mobility, decreased ROM, increased fascial restrictions, increased muscle spasms, impaired UE functional use, postural dysfunction, and pain.   ACTIVITY LIMITATIONS: carrying, lifting, bending, bed mobility, and reach over head  PERSONAL FACTORS: Age, Fitness,  Past/current experiences, and 1 comorbidity: lymphedema are also affecting patient's functional outcome.   REHAB POTENTIAL: Good  CLINICAL DECISION MAKING: Stable/uncomplicated  EVALUATION COMPLEXITY: Low   GOALS: Goals reviewed with patient? No   SHORT TERM GOALS=LONG TERM GOALS: Target date: 02/19/2024  Patient to demonstrate independence in advanced HEP  Baseline: [id number] Goal status: ongoing 12/25/2023  2.  Patient will acknowledge 4/10 pain at least once during episode of care   Baseline: 8/10 Goal status: MET 12/25/2023 (2/10)  3.  Patient will score at least 20/50 on NDI to signify clinically meaningful improvement in functional abilities.   Baseline: 28/50 Goal status:  met 9/30 (however not met 12/25/2023  4.  160d AROM in B shoulder flexion/abduction Baseline: 150d Goal status: ongoing  5.  Improved cervical rotation ROM to 45 degrees needed for driving revised Baseline:  Goal status: MET 12/25/2023  6. Patient will be able to walk at [facility] due to improved cardiovascular endurance and improved LE strength. Baseline:  Goal status: NEW (updated)  7. PSFS rating for computer work improved to 6-7  Baseline:  Goal status: MET (6) 12/25/2023  8. PSFS rating for cooking improved to 8 and able to cook 1 hour due to improved muscular endurance and decreased pain.  Baseline: 6 Goal status:NEW (updated )  9. Patient will demonstrate correct squatting, hinging, and lifting technique with no cues to prevent mechanical strain and injury.   Baseline: 6 Goal status:NEW (updated )   10. Patient will perform 5STS in < or = to 13 sec due to improved LE strength and functional mobility.  Baseline: 17.31 sec Goal status:NEW    PLAN:  PT FREQUENCY: 1-2x/week  PT DURATION: 8 weeks   PLANNED INTERVENTIONS: [id number]- PT Re-evaluation, 97110-Therapeutic exercises, 97530- Therapeutic activity, 97112- Neuromuscular re-education, 97535- Self Care, [id number]-  Manual therapy, [id number]- Gait training, [phone]- Canalith repositioning, [id number]- Aquatic Therapy, [phone]- Electrical stimulation (unattended), [phone]- Electrical stimulation (manual), [id number]- Vasopneumatic device, [id number]- Traction (mechanical), [id number]- Ionotophoresis 4mg /ml Dexamethasone , 79439 (1-2 muscles), [id number] (3+ muscles)- Dry Needling, Stair training, Taping, Joint mobilization, Joint manipulation, Spinal manipulation, Spinal mobilization, Vestibular training, Cryotherapy, and Moist heat  PLAN FOR NEXT SESSION  core strengthening , postural strengthening, shoulder ROM, update HEP; patient to start aquatics 12/31; see how she did after lumbar injections   Kristeen Sar, PT, DPT 12/25/2023 9:53 AM   "

## 2023-12-25 NOTE — Telephone Encounter (Signed)
 Attempted to reach pt to schedule/answer questions about sclerotherapy. LVM for her to call us  back.

## 2023-12-26 ENCOUNTER — Other Ambulatory Visit (HOSPITAL_COMMUNITY): Payer: Self-pay

## 2023-12-26 ENCOUNTER — Telehealth: Payer: Self-pay

## 2023-12-26 ENCOUNTER — Ambulatory Visit (HOSPITAL_COMMUNITY)
Admission: RE | Admit: 2023-12-26 | Discharge: 2023-12-26 | Disposition: A | Discharge status: Home / Self Care | Source: Ambulatory Visit | Attending: Cardiology | Admitting: Cardiology

## 2023-12-26 ENCOUNTER — Ambulatory Visit: Payer: Self-pay | Admitting: Cardiology

## 2023-12-26 DIAGNOSIS — I872 Venous insufficiency (chronic) (peripheral): Secondary | ICD-10-CM

## 2023-12-26 DIAGNOSIS — M79605 Pain in left leg: Secondary | ICD-10-CM

## 2023-12-26 DIAGNOSIS — R0609 Other forms of dyspnea: Secondary | ICD-10-CM | POA: Insufficient documentation

## 2023-12-26 LAB — EXERCISE TOLERANCE TEST
Angina Index: 1
Duke Treadmill Score: 2
Estimated workload: 7
Exercise duration (min): 6 min
MPHR: 165 {beats}/min
Peak HR: 146 {beats}/min
Percent HR: 88 %
Rest HR: 79 {beats}/min
ST Depression (mm): 0 mm

## 2023-12-26 NOTE — Telephone Encounter (Signed)
 Returned pt's call-she has c/o worsening LLE varicose veins with pain/swelling. She feels they have worsened the last few months. She is interested in sclerotherapy but due to increasing pain she opted to have a reflux study done again prior to proceeding, as it has been over 2 years since her last reflux study. She was given the choice to see PA sooner or wait for MD appt and she has chose to see PA. She is scheduled for both reflux study and APP appt and is aware of these.

## 2023-12-27 DIAGNOSIS — Z03818 Encounter for observation for suspected exposure to other biological agents ruled out: Secondary | ICD-10-CM | POA: Diagnosis not present

## 2023-12-27 DIAGNOSIS — K219 Gastro-esophageal reflux disease without esophagitis: Secondary | ICD-10-CM | POA: Diagnosis not present

## 2023-12-27 DIAGNOSIS — D72829 Elevated white blood cell count, unspecified: Secondary | ICD-10-CM | POA: Diagnosis not present

## 2023-12-27 DIAGNOSIS — I890 Lymphedema, not elsewhere classified: Secondary | ICD-10-CM | POA: Diagnosis not present

## 2023-12-27 DIAGNOSIS — K200 Eosinophilic esophagitis: Secondary | ICD-10-CM | POA: Diagnosis not present

## 2023-12-29 ENCOUNTER — Telehealth: Payer: Self-pay | Admitting: Cardiology

## 2023-12-29 ENCOUNTER — Other Ambulatory Visit (HOSPITAL_COMMUNITY): Payer: Self-pay

## 2023-12-29 LAB — ECHOCARDIOGRAM COMPLETE: Area-P 1/2: 2.76 cm2

## 2023-12-29 MED ORDER — DIAZEPAM 5 MG PO TABS
ORAL_TABLET | ORAL | 0 refills | Status: DC
  Filled 2023-12-29: qty 2, 1d supply, fill #0

## 2023-12-29 MED ORDER — DIAZEPAM 5 MG PO TABS: ORAL_TABLET | ORAL | 0 refills | Status: DC

## 2023-12-29 NOTE — Telephone Encounter (Signed)
 PT wants a call with results

## 2023-12-29 NOTE — Telephone Encounter (Signed)
 She is currently in iron overload- she wanted you to know. She will follow up with Dr Timmy about this. But did not know if you had any recommendations?  Do any of the abnormalities seen on the Echo lead you to believe that she has Cardiac Hemochromatosis? The left ventricular hypertrophy- could that be caused by hemochromatosis? The calcifications seen on aortic valve could it be iron buildup? She wanted to run this by you and see if she needed a follow up visit to discuss this?  Informed her that I would send these questions to her provider and get back to her with his response. She verbalized understanding.

## 2023-12-29 NOTE — Telephone Encounter (Signed)
 Elmira Newman PARAS, MD to [facility] Triage (Selected Message)    12/29/23  2:20 PM Note Mild LVH less likely to be related to hemochromatosis.  Occasional elevated blood pressure could also cause this.  We could obtain cardiac MRI, as patient is concerned and this could be related to hemochromatosis, although my clinical suspicion is low.   Thanks MJP     S/w the patient and she would like to get the Cardiac MRI performed. Order placed She is claustrophobic, will need some premeds.   Informed her that I would call back about pre-meds for MRI. She verbalized understanding.

## 2023-12-29 NOTE — Telephone Encounter (Signed)
 Ativan 5 mg 2 pills no refills Take 1-2 pills 30-60 min before MRI.  Thanks MJP

## 2023-12-29 NOTE — Telephone Encounter (Signed)
 Mild LVH less likely to be related to hemochromatosis.  Occasional elevated blood pressure could also cause this.  We could obtain cardiac MRI, as patient is concerned and this could be related to hemochromatosis, although my clinical suspicion is low.  Thanks MJP

## 2023-12-29 NOTE — Telephone Encounter (Signed)
 Correction: Valium  5 mg, not ativan. Rest of the instructions remain the same.  Thanks MJP

## 2023-12-29 NOTE — Telephone Encounter (Signed)
 Spoke with pt regarding medication for claustrophobia for MRI. Valium  5 mg ordered (2 pills, no refills). Pt to take 1-2 tablets 30-60 minutes prior to MRI. Pt verbalized understanding. All questions if any were answered.

## 2023-12-29 NOTE — Telephone Encounter (Signed)
 Err

## 2023-12-30 ENCOUNTER — Other Ambulatory Visit (HOSPITAL_COMMUNITY): Payer: Self-pay

## 2024-01-01 ENCOUNTER — Ambulatory Visit: Admitting: Occupational Therapy

## 2024-01-01 ENCOUNTER — Telehealth: Payer: Self-pay

## 2024-01-01 NOTE — Telephone Encounter (Addendum)
 Verified patient full name DOB. Patient feels she needs to be tested with patch testing because has had some procedures done and still having trouble with outbreaks Last Office Visit 12/25 Next Office Visit 2/26 Patient's best contact number is [phone] Eleanor Loyal Mabe, CMA

## 2024-01-01 NOTE — Telephone Encounter (Signed)
 Received phone call from patient stating she had labs drawn by her PCP on 12/27/23 which showed an iron saturation of 30.  Dr. Timmy aware and stated that the patient was borderline and needed one phlebotomy.  Pt aware of plan of care and agreeable to plan. Scheduling message sent. Pt had no further needs identified.

## 2024-01-02 ENCOUNTER — Ambulatory Visit: Admitting: Family

## 2024-01-02 ENCOUNTER — Other Ambulatory Visit: Payer: Self-pay

## 2024-01-02 ENCOUNTER — Encounter: Payer: Self-pay | Admitting: Family

## 2024-01-02 VITALS — BP 116/80 | HR 82 | Temp 98.2°F

## 2024-01-02 DIAGNOSIS — L231 Allergic contact dermatitis due to adhesives: Secondary | ICD-10-CM

## 2024-01-02 NOTE — Patient Instructions (Addendum)
 1. SOB (shortness of breath) - Lung testing actually looks normal today which is good news. - I want to make sure that you do not have a collapsed lung from the port placement, so we are going to get a CXR to check on that.  - This was ordered for [facility].  - You can continue with the albuterol  2 puffs every 4-6 hours.  2. Dysphagia and chronic diarrhea - Continue with the Prevacid  30mg  daily.  - One idea is to start immunoglobulin replacement as an immunomodulatory agent. - But this would be hard to get set up since your labs have been normal.  - I had another patient (not as complicated as you, obviously!) who had chronic diarrhea and it improved when we started her on immunoglobulin, but she also had recurrent infections and inadequate response to Streptococcus pneumoniae on labs).  - Immunoglobulin also increases your risk of blood clots and you are already at high risk of this because of your hemochromatosis.  - I will check on the SIBO testing and the H pylori testing.   3. Recurrent sinusitis  - Continue with Flonase  one spray per nostril daily.  - Your titers were very protective following the vaccination. - We can do a Streptococcal avidity assay in the future if needed. - But overall this seems to be very stable and the least of your worries.   4. G6PD deficiency  - Continue to follow the dietary changes.  - Keep seeing the RD as you are doing.   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  6.Concern for contact dermatitis Discussed with patient that patch testing tests for contact dermatitis. Positive patch testing results can help in avoiding those items.  There is a chance of false negative results or the chemical causing your reaction is not part of the patch panel. Our panel is the NAC- 80 The sensitivity of patch  testing can range from 60-80% and therefore false negative results are possible.  Therefore, this does not definitively rule out contact dermatitis.  Please schedule for patch testing in our [facility] office - will need to come Monday, Wednesday, Friday. Please avoid strenuous physical activities and do not get the patches on the back wet.  Okay to take antihistamines for itching but avoid placing any creams on the back where the patches are. No steroids creams for 2 weeks before. No steroids by mouth or injections 4 weeks prior We will remove the patches on Wednesday and will do our initial read. Then you will come back on Friday for a final read.  -  Schedule NAC 80 patch testing with paper tape if needed

## 2024-01-02 NOTE — Progress Notes (Signed)
 "  [address] [place] [address] Dept: [phone]  FOLLOW UP NOTE  Patient ID: Karen [name], female    DOB: [birth year]  Age: 53 y.o. MRN: [id number] Date of Office Visit: 01/02/2024  Assessment  Chief Complaint: Follow-up, Allergic Reaction (States that adhesive causing redness and itching.), Pruritus, and Rash  HPI Karen [name] is a 54 year old female who presents today for an appointment wanting patch testing.  She was last seen on December 07, 2023 by Dr. 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  She also has 3 stable lung nodule (benign imaging guidelines), diarrhea with a history of SIBO status posttreatment by Dr. 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  She reports that since her last office visit elevated bilirubin was found in her urine.  She saw urology this morning did repeat testing and bilirubin was still found in her urine.  It was recommended that she follow-up with her hematologist and gastroenterologist.  She reports that her stomach has been hurting, she continues to have diarrhea and her stools are mustard colored.  Also in early December her white blood cell count was elevated, but repeat white blood cell count on December 27, 2023 was reported as normal.  He does also mention that since her last office visit she did see cardiology as recommended by Dr. Iva and had a stress test and echo.  She has a cardiac MRI in a couple weeks to rule out cardiac hemochromatosis.  She reports that  she had a site reaction from her second port cath and she saw her dermatologist and was told that she did have a severe reaction to to the adhesives and Tegaderm.  She recommended patch testing.  She also reports that she was itchy and red with the leads to use her cardiac testing.  They tried rubbing the areas quickly with alcohol after taking the leads off.   Drug Allergies:  Allergies[1]  Review of Systems: Negative except as per HPI   Physical Exam: BP 116/80   Pulse 82   Temp 98.2 F (36.8 C)   SpO2 99%    Physical Exam Constitutional:      Appearance: Normal appearance.  HENT:     Head: Normocephalic and atraumatic.     Comments: Pharynx normal, eyes normal, ears normal, nose normal    Right Ear: Tympanic membrane, ear canal and external ear normal.     Left Ear: Tympanic membrane, ear canal and external ear normal.     Nose: Nose normal.     Mouth/Throat:     Mouth: Mucous membranes are moist.     Pharynx: Oropharynx is clear.  Eyes:     Conjunctiva/sclera: Conjunctivae normal.  Cardiovascular:     Rate and Rhythm: Regular rhythm.     Heart sounds: Normal heart sounds.  Pulmonary:     Effort: Pulmonary effort is normal.     Breath sounds: Normal breath sounds.     Comments: Lungs clear to auscultation Musculoskeletal:     Cervical back: Neck supple.  Skin:    General: Skin is warm.     Comments: Slight linear hyperpigmentation  noted in left upper chest  Neurological:     Mental Status: She is alert and oriented to person, place, and time.  Psychiatric:        Mood and Affect: Mood normal.        Behavior: Behavior normal.        Thought Content: Thought content normal.        Judgment: Judgment normal.     Diagnostics:  none  Assessment and Plan: 1. Allergic contact dermatitis due to adhesives     No orders of the defined types were placed in this encounter.   Patient Instructions  1. SOB (shortness of breath) - Lung testing actually looks  normal today which is good news. - I want to make sure that you do not have a collapsed lung from the port placement, so we are going to get a CXR to check on that.  - This was ordered for [facility].  - You can continue with the albuterol  2 puffs every 4-6 hours.  2. Dysphagia and chronic diarrhea - Continue with the Prevacid  30mg  daily.  - One idea is to start immunoglobulin replacement as an immunomodulatory agent. - But this would be hard to get set up since your labs have been normal.  - I had another patient (not as complicated as you, obviously!) who had chronic diarrhea and it improved when we started her on immunoglobulin, but she also had recurrent infections and inadequate response to Streptococcus pneumoniae on labs).  - Immunoglobulin also increases your risk of blood clots and you are already at high risk of this because of your hemochromatosis.  - I will check on the SIBO testing and the H pylori testing.   3. Recurrent sinusitis  - Continue with Flonase  one spray per nostril daily.  - Your titers were very protective following the vaccination. - We can do a Streptococcal avidity assay in the future if needed. - But overall this seems to be very stable and the least of your worries.   4. G6PD deficiency  - Continue to follow the dietary changes.  - Keep seeing the RD as you are doing.   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  6.Concern for contact dermatitis Discussed with patient that patch testing tests for contact dermatitis. Positive patch testing results can help in avoiding those items.  There is a chance of false negative results or the chemical causing your reaction is not part of the patch panel. Our panel is the NAC- 80 The sensitivity of patch testing can range from 60-80% and therefore false negative  results are possible.  Therefore, this does not definitively rule out contact dermatitis.  Please schedule for patch testing in our [facility] office - will need to come Monday, Wednesday, Friday. Please avoid strenuous physical activities and do not get the patches on the back wet.  Okay to take antihistamines for itching but avoid placing any creams on the back where the patches are. No steroids creams for 2 weeks before. No steroids by mouth or injections 4 weeks prior We will remove the patches on Wednesday and will do our initial read. Then you will come back on Friday for a final read.  -  Schedule NAC 80 patch testing with paper tape if needed     Return for patch testing-NAC 80 with paper tape.  Thank you for the opportunity to care for this patient.  Please do not hesitate to contact me with questions.  Wanda Craze, FNP [facility]         [1]  Allergies Allergen Reactions   Chlorhexidine  Rash   Molds & Smuts Other (See Comments) and Cough    sneezing   Cyanoacrylate     Rash derrel   Methotrexate  Nausea And Vomiting   Tegaderm Ag Mesh [Silver]     Rash/erythema   "

## 2024-01-03 ENCOUNTER — Encounter (HOSPITAL_BASED_OUTPATIENT_CLINIC_OR_DEPARTMENT_OTHER): Payer: Self-pay | Admitting: Physical Therapy

## 2024-01-03 ENCOUNTER — Ambulatory Visit (HOSPITAL_BASED_OUTPATIENT_CLINIC_OR_DEPARTMENT_OTHER): Attending: Specialist | Admitting: Physical Therapy

## 2024-01-03 DIAGNOSIS — M62838 Other muscle spasm: Secondary | ICD-10-CM | POA: Diagnosis present

## 2024-01-03 DIAGNOSIS — M6281 Muscle weakness (generalized): Secondary | ICD-10-CM | POA: Insufficient documentation

## 2024-01-03 DIAGNOSIS — M5459 Other low back pain: Secondary | ICD-10-CM | POA: Insufficient documentation

## 2024-01-03 NOTE — Therapy (Signed)
 " OUTPATIENT PHYSICAL THERAPY FEMALE PELVIC TREATMENT     Patient Name: Karen [name] MRN: [id number] DOB:[birth year], 53 y.o., female Today's Date: 01/03/2024  END OF SESSION:  PT End of Session - 01/03/24 0924     Visit Number 24    Date for Recertification  02/19/24    Authorization Type [name]    PT Start Time [phone]    PT Stop Time 0930    PT Time Calculation (min) 44 min    Activity Tolerance Patient tolerated treatment well    Behavior During Therapy [facility] for tasks assessed/performed                       Past Medical History:  Diagnosis Date   Adenomyosis    Anemia    Arthritis 2013   L knee gets steroid injections    Arthritis, rheumatic, acute or subacute    Asthmatic bronchitis 01/31/2017   Back pain    Chronic constipation    Diarrhea    DVT of lower extremity (deep venous thrombosis) (HCC)    Dysmenorrhea    Endometriosis    Esophageal stricture    G6PD deficiency    GERD (gastroesophageal reflux disease)    History of kidney stones    History of uterine fibroid    IBS (irritable bowel syndrome)    Interstitial cystitis    Lactose intolerance    Migraine    with aura   Other hemochromatosis 06/10/2021   PONV (postoperative nausea and vomiting)    Recurrent upper respiratory infection (URI)    Sebaceous cyst of breast, right lower inner quadrant 10/25/2012   Excised 11/21/12    Sleep apnea    Syncope, non cardiac    Past Surgical History:  Procedure Laterality Date   ARTHROSCOPIC HAGLUNDS REPAIR     BREAST CYST EXCISION Right 11/21/2012   Procedure: CYST EXCISION BREAST;  Surgeon: Sherlean JINNY Laughter, MD;  Location: [facility];  Service: General;  Laterality: Right;   BREAST CYST EXCISION Bilateral 01/18/2022   Procedure: EXCISION OF SEBACEOUS CYST BILATERAL BREAST;  Surgeon: Vanderbilt Ned, MD;  Location: [facility];  Service: General;  Laterality: Bilateral;   BREAST EXCISIONAL BIOPSY  Right 11/2012   CESAREAN SECTION  04/19/1993   CHOLECYSTECTOMY     COLONOSCOPY  05/02/2011   Procedure: COLONOSCOPY;  Surgeon: Lynwood LITTIE Celestia Mickey., MD;  Location: [facility];  Service: Endoscopy;  Laterality: N/A;   colonoscopy  11/04/2014   DENTAL SURGERY  03/06/2018   2 surgeries ( 03/06/2018 and 04/06/2018)   to remove two separate benign tumors   ESOPHAGEAL MANOMETRY N/A 11/04/2015   Procedure: ESOPHAGEAL MANOMETRY (EM);  Surgeon: Lynwood Celestia, MD;  Location: [facility];  Service: Endoscopy;  Laterality: N/A;   ESOPHAGOGASTRODUODENOSCOPY  05/02/2011   Procedure: ESOPHAGOGASTRODUODENOSCOPY (EGD);  Surgeon: Lynwood LITTIE Celestia Mickey., MD;  Location: [facility];  Service: Endoscopy;  Laterality: N/A;   ESOPHAGOGASTRODUODENOSCOPY N/A 09/01/2014   Procedure: ESOPHAGOGASTRODUODENOSCOPY (EGD);  Surgeon: Lynwood Celestia, MD;  Location: [facility];  Service: Endoscopy;  Laterality: N/A;   IR CV LINE INJECTION  09/13/2023   IR CV LINE INJECTION  12/01/2023   IR IMAGING GUIDED PORT INSERTION  07/06/2023   IR IMAGING GUIDED PORT INSERTION  12/01/2023   IR REMOVAL TUN ACCESS W/ PORT W/O FL MOD SED  12/01/2023   KNEE SURGERY     LAPAROSCOPY     x 4   LESION REMOVAL N/A 01/18/2022  Procedure: EXCISION OF SEBACEOUS CYSTS CHEST AND NECK;  Surgeon: Vanderbilt Ned, MD;  Location: [facility];  Service: General;  Laterality: N/A;   PH IMPEDANCE STUDY N/A 11/04/2015   Procedure: PH IMPEDANCE STUDY;  Surgeon: Lynwood Bohr, MD;  Location: [facility];  Service: Endoscopy;  Laterality: N/A;   VAGINAL HYSTERECTOMY  2010   TVH--ovaries remain   Patient Active Problem List   Diagnosis Date Noted   Sjogren syndrome with keratoconjunctivitis 12/09/2022   Hemochromatosis 06/10/2021   Elevated LFTs 04/04/2021   Colitis 04/04/2021   Bacterial overgrowth syndrome 05/21/2020   Obesity 05/21/2020   History of endometriosis 05/21/2020   Constipation due to outlet dysfunction 02/05/2020    Gastroesophageal reflux disease 02/05/2020   Obstructive sleep apnea treated with continuous positive airway pressure (CPAP) 06/16/2017   Perennial allergic rhinitis with a nonallergic component 01/31/2017   Asthmatic bronchitis 01/31/2017   GI symptoms 01/31/2017   Family history of colon cancer 01/27/2015   Chest pain 12/13/2012   Dyspnea 12/13/2012   DVT of lower extremity (deep venous thrombosis) (HCC) 01/03/1990    PCP: Elliot Charm, MD  REFERRING PROVIDER: Cleotilde Ronal RAMAN, MD   REFERRING DIAG: M62.89 (ICD-10-CM) - Pelvic floor dysfunction  THERAPY DIAG:  Muscle weakness (generalized)  Other muscle spasm  Other low back pain  Rationale for Evaluation and Treatment: Rehabilitation  ONSET DATE: chronic  SUBJECTIVE:                                                                                                                                                                                           SUBJECTIVE STATEMENT:  Pt reports she is afraid of water.  She assured we are not swimming in here nor will she go under.  Pain 3/10     PAIN: 12/25/23 Are you having pain? Yes NPRS scale: 3/10 Pain location: Lower abdominal pain, Lt sided pain, low back pain  Pain type: turning, knotted, pressure Pain description: intermittent and constant   Aggravating factors: constipation or frequent bowel movements/constantly having bowel movements  Relieving factors: exercises (bowel massage, happy baby, spinal twists, walking)  PRECAUTIONS: None  WEIGHT BEARING RESTRICTIONS: No  FALLS:  Has patient fallen in last 6 months? No  LIVING ENVIRONMENT: Lives with: lives with their spouse Lives in: House/apartment  OCCUPATION: nurse, in admin  PLOF: Independent  PATIENT GOALS: decrease pain and have more normal bowel movements  PERTINENT HISTORY:  Vaginal hysterectomy, DVT LE, endometriosis, interstitial cystitis, exploratory laps for endometriosis, c-section,  cholecystectomy, RA diagnosed 06/2023 Sexual abuse: No  BOWEL MOVEMENT: Pain with bowel movement: Yes Type of bowel movement:Type (Bristol Stool Scale)  1-7 (sometimes full range in the same bowel movement), Frequency sometimes many times a day, sometimes up to 4 days in between, and Strain Yes Fully empty rectum: No Leakage: No Pads: No Fiber supplement: No - has attempted metamucil and it made constipation worse  URINATION: Pain with urination: Yes Fully empty bladder: No Stream: varies  Urgency: Yes: all the time Frequency: multiple times an hours  Leakage: Urge to void, Walking to the bathroom, Coughing, Sneezing, Laughing, and Exercise Pads: Yes: daily  INTERCOURSE: Pain with intercourse: Initial Penetration and During Penetration Ability to have vaginal penetration:  Yes: with pain Climax: non painful WNL   PREGNANCY: Vaginal deliveries 1 Tearing Yes: 3rd degree tear C-section deliveries 1 Currently pregnant No  PROLAPSE: Periodically will feel vaginal bulging, heaviness in lower abdomen   OBJECTIVE:  10/10/23 Curl-up test: Minimal abdominal distortion focused in upper abdominals, largely improved Transversus abdominus: good core facilitation with exhale Sit-up test: 1/3  LUMBARAROM/PROM:  A/PROM A/PROM  Eval (% available) 11/09/22 (% available) 04/25/23 (% available) 10/10/23 (% available)  Flexion 50 90 90 100  Extension 25, pain anteriorly  25, pain in low back 50 75  Right lateral flexion 50, pain anteriorly  75, pain on right 50, pain on Rt 75  Left lateral flexion 50, pain anteriorly  75, pain on right 75 75   (Blank rows = not tested)    05/04/23:               Internal Pelvic Floor: mild tenderness in bil levator ani, but no increase in tension; with internal rectal exam, she was demonstrating paradoxical contraction when trying to eccentrically lengthen with exhale and had very hard time correcting  Patient confirms identification and approves PT to  assess internal pelvic floor and treatment Yes  PELVIC MMT:   MMT eval  Vaginal 2/5, 3 second hold, 3 repeat contractions  Internal Anal Sphincter 1/5  External Anal Sphincter 2/5  Puborectalis 1/5  Diastasis Recti   (Blank rows = not tested)        TONE: WNL   PROLAPSE: Grade 2 anterior vaginal wall laxity  04/25/23: Curl-up test: abdominal distortion Transversus abdominus: week, difficulty getting active contraction (not been working on strengthening)    LUMBARAROM/PROM:  A/PROM A/PROM  Eval (% available) 11/09/22 (% available) 04/25/23 (% available)  Flexion 50 90 90  Extension 25, pain anteriorly  25, pain in low back 50  Right lateral flexion 50, pain anteriorly  75, pain on right 50, pain on Rt  Left lateral flexion 50, pain anteriorly  75, pain on right 75   (Blank rows = not tested)  11/09/22: No internal rectal or vaginal pelvic floor exam performed due to high level of skin irritation   Weak transversus abdominus contraction  Abdominal restriction and tenderness in bil lower quadrant  Unable to perform pelvic tilt with appropriate coordination  LUMBARAROM/PROM:  A/PROM A/PROM  Eval (% available) 11/09/22 (% available)  Flexion 50 90  Extension 25, pain anteriorly  25, pain in low back  Right lateral flexion 50, pain anteriorly  75, pain on right  Left lateral flexion 50, pain anteriorly  75, pain on right   (Blank rows = not tested) 07/21/22: standing prolapse assessment demonstrated grade 2 anterior vaginal wall laxity   06/08/22:               External Perineal Exam: WNL  Internal Pelvic Floor: burning reported with palpation of superficial muscles; deep aching/pressure with palpation of Lt levator ani   Patient confirms identification and approves PT to assess internal pelvic floor and treatment Yes  PELVIC MMT:   MMT eval  Vaginal 3/5  Internal Anal Sphincter 1/5  External Anal Sphincter 2/5  Puborectalis 1/5   Diastasis Recti   (Blank rows = not tested)        TONE: High in Lt levator ani   PROLAPSE: Not able to tell this session due to dyssynergic pelvic floor contraction     06/01/22: COGNITION: Overall cognitive status: Within functional limits for tasks assessed     SENSATION: Light touch: Appears intact Proprioception: Appears intact  GAIT: Comments: decreased hip extension, forward flexed trunk  POSTURE: rounded shoulders, forward head, decreased lumbar lordosis, increased thoracic kyphosis, posterior pelvic tilt, and flexed trunk    LUMBARAROM/PROM:  A/PROM A/PROM  Eval (% available)  Flexion 50  Extension 25, pain anteriorly   Right lateral flexion 50, pain anteriorly   Left lateral flexion 50, pain anteriorly    (Blank rows = not tested)   PALPATION:   General  Significant abdominal restriction and tenderness; decreased rib mobility with mobilization and breathing    TODAY'S TREATMENT:                                                                                                                              DATE:  [facility] Adult PT Treatment:                                                DATE: 01/03/24 Pt seen for aquatic therapy today.  Treatment took place in water 3.5-4.75 ft in depth at the [facility] pool. Temp of water was 91.  Pt entered/exited the pool via stairs using alternating pattern with hand rail.  *Intro to setting *walking forward, back and side stepping in 3.6- 4.0 ft with ue support of barbell  - squatted rest period at walls as needed *L stretch CGA and demonstration *forward backward marching unsupported *Ue support on wall: toe raises; heel raises; hip add/abd; hip extension/flex; relaxed squats *decompression position with noodle wrapped anteriorly then posteriorly across chest: cycling; hip add/abd; flex/ext  Pt requires the buoyancy and hydrostatic pressure of water for support, and to offload joints by unweighting joint load  by at least 50 % in navel deep water and by at least 75-80% in chest to neck deep water.  Viscosity of the water is needed for resistance of strengthening. Water current perturbations provides challenge to standing balance requiring increased core activation.       12/25/23 Manual: Supine: ILU bowel mobilization  Abdominal scar tissue mobilization Ileocecal valve mobilization External rectum mobilization Diaphragm release ILU mobilization Prone: Myofascial release to low back Soft tissue  mobilization to lumbar paraspinals and glutes  Therapeutic activities: Partner education on bowel mobilization techniques    12/20/23 Manual: Supine: ILU bowel mobilization  Abdominal scar tissue mobilization Ileocecal valve mobilization External rectum mobilization Diaphragm release Therapeutic activities: Squats to table 2 x 10 3 way kick 10x each bil Side stepping + blue loop 3 laps  Heel raises 3 x 10 Staggered deadlift and pt education through bending in this position when performing functional tasks 10x bil   12/14/23 Manual: Supine: ILU bowel mobilization  Abdominal scar tissue mobilization Ileocecal valve mobilization External rectum mobilization Diaphragm release  Neuromuscular re-education: Seated hip internal rotation + red band + block 10x  Seated hip abduction + red band 2 x 10 Seated unilateral hip abduction + red band 10x bil Seated march + red band 2 x 10 Therapeutic activities: Squats + 5 lbs to table 2 x 10 3 way kick 10x each, bil Pt education performed on discharge planning, independence with HEP, teaching husband manual techniques to perform at home    PATIENT EDUCATION:  Education details: See above Person educated: Patient Education method: Explanation, Demonstration, Tactile cues, Verbal cues, and Handouts Education comprehension: verbalized understanding  HOME EXERCISE PROGRAM: [email or web address]  ASSESSMENT:  CLINICAL IMPRESSION: Pt demonstrates  safety and independence in aquatic setting with therapist instructing from deck. She is slightly apprehensive in setting but moves through water with increasing confidence as session progresses.  Pt is directed through various movement patterns and trials in both sitting and standing positions.   She is provided VC, CGA and demonstration throughout session for execution of exercises while monitoring toleration. Overall very good toleration with reduction in LBP.   She is a good candidate for aquatic intervention and will benefit from the properties of water to progress towards functional goals.    We have continued discharge planning and are planning for our last visit to be 01/16/24. Husband is here for training on bowel mobilization techniques in order to help prepare for discharge. Due to this, we focused on manual techniques this session. Husband and patient report that they feel comfortable with implementing these techniques at home. They were able to video all techniques on their phone in order to have reference. She will continue to benefit from skilled PT intervention in order to decrease pain, improve bowel movements, and promote independence with HEP.     OBJECTIVE IMPAIRMENTS: decreased activity tolerance, decreased coordination, decreased endurance, decreased mobility, decreased strength, increased fascial restrictions, increased muscle spasms, impaired tone, postural dysfunction, and pain.   ACTIVITY LIMITATIONS: lifting, bending, continence, and locomotion level  PARTICIPATION LIMITATIONS: interpersonal relationship, community activity, and occupation  PERSONAL FACTORS: 1 comorbidity: medical history are also affecting patient's functional outcome.   REHAB POTENTIAL: Good  CLINICAL DECISION MAKING: Stable/uncomplicated  EVALUATION COMPLEXITY: Low   GOALS: Goals reviewed with patient? Yes  SHORT TERM GOALS: Updated 10/10/23  Pt will be independent with HEP.   Baseline: Goal  status: MET 07/12/22  2.  Pt will be independent with diaphragmatic breathing and down training activities in order to improve pelvic floor relaxation.  Baseline:  Goal status: MET 07/12/22  3.  Pt will be independent with the knack, urge suppression technique, and double voiding in order to improve bladder habits and decrease urinary incontinence.   Baseline:  Goal status: MET 09/22/22  4.  Pt will be independent with use of squatty potty, relaxed toileting mechanics, and improved bowel movement techniques in order to increase ease of bowel movements and complete evacuation.  Baseline:  Goal status: MET 11/09/22  5.  Pt will be able to correctly perform diaphragmatic breathing and appropriate pressure management in order to prevent worsening vaginal wall laxity and improve pelvic floor A/ROM.   Baseline:  Goal status: MET 01/18/23  LONG TERM GOALS: Updated 10/10/23  Pt will be independent with advanced HEP.   Baseline:  Goal status: IN PROGRESS 12/20/23  2.  Pt will demonstrate normal pelvic floor muscle tone and A/ROM, able to achieve 4/5 strength with contractions and 10 sec endurance, in order to provide appropriate lumbopelvic support in functional activities.   Baseline: not assessed today per patient request - no current bladder issues and they have all resolved Goal status: IN PROGRESS 12/20/23  3.  Pt will increase all impaired lumbar A/ROM by 25% without pain.  Baseline: Pt seen a little bit of loss in some and improvement in extension Goal status:  IN PROGRESS 12/20/23  4.  Pt will report pain no higher than 3/10 with any activity. Baseline: pain currently 3/10, worst is an 8/10 and often corresponds to after she is eating Goal status:  IN PROGRESS 12/20/23  5.  Pt will report 0/10 pain with vaginal penetration in order to improve intimate relationship with partner.    Baseline: no recent intercourse attempts  Goal status:  IN PROGRESS 12/20/23  6.  Pt will be able  to go 2-3 hours in between voids without urgency or incontinence in order to improve QOL and perform all functional activities with less difficulty.   Baseline:  Goal status: MET 02/21/23  7.  Pt will report no leaks with laughing, coughing, sneezing in order to improve comfort with interpersonal relationships and community activities.   Baseline: Pt states that she feels 30-40% better Goal status:  MET 10/10/23  8.  Pt will have consistent bowel movements 4-5x/week without straining or pain.  Baseline:  Goal status:  MET 02/21/23  PLAN:  PT FREQUENCY: 1-2x/week  PT DURATION: 6 months  PLANNED INTERVENTIONS: Therapeutic exercises, Therapeutic activity, Neuromuscular re-education, Balance training, Gait training, Patient/Family education, Self Care, Joint mobilization, Dry Needling, Biofeedback, and Manual therapy  PLAN FOR NEXT SESSION: plan for 01/16/24 to be last visit.   [address] [place]) Lynnie Koehler MPT 01/03/2024 9:28 AM [facility] [address] [place], [place], [address] Phone: [phone]   Fax:  [phone]  "

## 2024-01-04 ENCOUNTER — Encounter: Payer: Self-pay | Admitting: Hematology & Oncology

## 2024-01-05 ENCOUNTER — Inpatient Hospital Stay: Payer: Self-pay | Attending: Hematology & Oncology

## 2024-01-05 MED ORDER — SODIUM CHLORIDE 0.9 % IV SOLN: INTRAVENOUS | Status: DC

## 2024-01-05 NOTE — Progress Notes (Signed)
 Pt. arrived for Therapeutic phlebotomy. Port access with 19 g HN and procedure completed using 20ml syringes. 500 g of blood removed and pt. Tolerated procedure well.  IVF 500ml given post tmt. AVS reviewed.

## 2024-01-05 NOTE — Patient Instructions (Signed)
 [facility] - A DEPT OF [facility]  Discharge Instructions: Thank you for choosing  Cancer Center to provide your oncology and hematology care.   If you have a lab appointment with the Cancer Center, please go directly to the Cancer Center and check in at the registration area.  Wear comfortable clothing and clothing appropriate for easy access to any Portacath or PICC line.   We strive to give you quality time with your provider. You may need to reschedule your appointment if you arrive late (15 or more minutes).  Arriving late affects you and other patients whose appointments are after yours.  Also, if you miss three or more appointments without notifying the office, you may be dismissed from the clinic at the provider's discretion.      For prescription refill requests, have your pharmacy contact our office and allow 72 hours for refills to be completed.    Today you received the following procedure: therapeutic phlebotomy.      To help prevent nausea and vomiting after your treatment, we encourage you to take your nausea medication as directed.  BELOW ARE SYMPTOMS THAT SHOULD BE REPORTED IMMEDIATELY: *FEVER GREATER THAN 100.4 F (38 C) OR HIGHER *CHILLS OR SWEATING *NAUSEA AND VOMITING THAT IS NOT CONTROLLED WITH YOUR NAUSEA MEDICATION *UNUSUAL SHORTNESS OF BREATH *UNUSUAL BRUISING OR BLEEDING *URINARY PROBLEMS (pain or burning when urinating, or frequent urination) *BOWEL PROBLEMS (unusual diarrhea, constipation, pain near the anus) TENDERNESS IN MOUTH AND THROAT WITH OR WITHOUT PRESENCE OF ULCERS (sore throat, sores in mouth, or a toothache) UNUSUAL RASH, SWELLING OR PAIN  UNUSUAL VAGINAL DISCHARGE OR ITCHING   Items with * indicate a potential emergency and should be followed up as soon as possible or go to the Emergency Department if any problems should occur.  Please show the CHEMOTHERAPY ALERT CARD or IMMUNOTHERAPY ALERT CARD at  check-in to the Emergency Department and triage nurse. Should you have questions after your visit or need to cancel or reschedule your appointment, please contact [facility] - A DEPT OF [facility]  [phone] and follow the prompts.  Office hours are 8:00 a.m. to 4:30 p.m. Monday - Friday. Please note that voicemails left after 4:00 p.m. may not be returned until the following business day.  We are closed weekends and major holidays. You have access to a nurse at all times for urgent questions. Please call the main number to the clinic [phone] and follow the prompts.  For any non-urgent questions, you may also contact your provider using MyChart. We now offer e-Visits for anyone 34 and older to request care online for non-urgent symptoms. For details visit [email or web address].   Also download the MyChart app! Go to the app store, search MyChart, open the app, select , and log in with your MyChart username and password.

## 2024-01-08 ENCOUNTER — Ambulatory Visit: Payer: Self-pay | Attending: Surgery | Admitting: Occupational Therapy

## 2024-01-08 DIAGNOSIS — I890 Lymphedema, not elsewhere classified: Secondary | ICD-10-CM | POA: Insufficient documentation

## 2024-01-08 NOTE — Therapy (Signed)
 " OUTPATIENT OCCUPATIONAL THERAPY TREATMENT NOTE  BLE/ BLQ LYMPHEDEMA  Patient Name: Karen [name] MRN: [id number] DOB:12-21-1970, 54 y.o., female Today's Date: 01/08/2024  REPORTING PERIOD: 10/30/23 -   END OF SESSION:  OT End of Session - 01/08/24 1231     Visit Number 56    Number of Visits 72    Date for Recertification  03/17/24    OT Start Time 1000    OT Stop Time 1105    OT Time Calculation (min) 65 min    Activity Tolerance Patient tolerated treatment well;No increased pain    Behavior During Therapy [facility] for tasks assessed/performed           Past Medical History:  Diagnosis Date   Adenomyosis    Anemia    Arthritis 2013   L knee gets steroid injections    Arthritis, rheumatic, acute or subacute    Asthmatic bronchitis 01/31/2017   Back pain    Chronic constipation    Diarrhea    DVT of lower extremity (deep venous thrombosis) (HCC)    Dysmenorrhea    Endometriosis    Esophageal stricture    G6PD deficiency    GERD (gastroesophageal reflux disease)    History of kidney stones    History of uterine fibroid    IBS (irritable bowel syndrome)    Interstitial cystitis    Lactose intolerance    Migraine    with aura   Other hemochromatosis 06/10/2021   PONV (postoperative nausea and vomiting)    Recurrent upper respiratory infection (URI)    Sebaceous cyst of breast, right lower inner quadrant 10/25/2012   Excised 11/21/12    Sleep apnea    Syncope, non cardiac    Past Surgical History:  Procedure Laterality Date   ARTHROSCOPIC HAGLUNDS REPAIR     BREAST CYST EXCISION Right 11/21/2012   Procedure: CYST EXCISION BREAST;  Surgeon: Sherlean JINNY Laughter, MD;  Location: [facility];  Service: General;  Laterality: Right;   BREAST CYST EXCISION Bilateral 01/18/2022   Procedure: EXCISION OF SEBACEOUS CYST BILATERAL BREAST;  Surgeon: Vanderbilt Ned, MD;  Location: [facility];  Service: General;  Laterality: Bilateral;   BREAST  EXCISIONAL BIOPSY Right 11/2012   CESAREAN SECTION  04/19/1993   CHOLECYSTECTOMY     COLONOSCOPY  05/02/2011   Procedure: COLONOSCOPY;  Surgeon: Lynwood LITTIE Celestia Mickey., MD;  Location: [facility];  Service: Endoscopy;  Laterality: N/A;   colonoscopy  11/04/2014   DENTAL SURGERY  03/06/2018   2 surgeries ( 03/06/2018 and 04/06/2018)   to remove two separate benign tumors   ESOPHAGEAL MANOMETRY N/A 11/04/2015   Procedure: ESOPHAGEAL MANOMETRY (EM);  Surgeon: Lynwood Celestia, MD;  Location: [facility];  Service: Endoscopy;  Laterality: N/A;   ESOPHAGOGASTRODUODENOSCOPY  05/02/2011   Procedure: ESOPHAGOGASTRODUODENOSCOPY (EGD);  Surgeon: Lynwood LITTIE Celestia Mickey., MD;  Location: [facility];  Service: Endoscopy;  Laterality: N/A;   ESOPHAGOGASTRODUODENOSCOPY N/A 09/01/2014   Procedure: ESOPHAGOGASTRODUODENOSCOPY (EGD);  Surgeon: Lynwood Celestia, MD;  Location: [facility];  Service: Endoscopy;  Laterality: N/A;   IR CV LINE INJECTION  09/13/2023   IR CV LINE INJECTION  12/01/2023   IR IMAGING GUIDED PORT INSERTION  07/06/2023   IR IMAGING GUIDED PORT INSERTION  12/01/2023   IR REMOVAL TUN ACCESS W/ PORT W/O FL MOD SED  12/01/2023   KNEE SURGERY     LAPAROSCOPY     x 4   LESION REMOVAL N/A 01/18/2022   Procedure: EXCISION OF  SEBACEOUS CYSTS CHEST AND NECK;  Surgeon: Vanderbilt Ned, MD;  Location: [facility];  Service: General;  Laterality: N/A;   PH IMPEDANCE STUDY N/A 11/04/2015   Procedure: PH IMPEDANCE STUDY;  Surgeon: Lynwood Bohr, MD;  Location: [facility];  Service: Endoscopy;  Laterality: N/A;   VAGINAL HYSTERECTOMY  2010   TVH--ovaries remain   Patient Active Problem List   Diagnosis Date Noted   Sjogren syndrome with keratoconjunctivitis 12/09/2022   Hemochromatosis 06/10/2021   Elevated LFTs 04/04/2021   Colitis 04/04/2021   Bacterial overgrowth syndrome 05/21/2020   Obesity 05/21/2020   History of endometriosis 05/21/2020   Constipation due to outlet dysfunction  02/05/2020   Gastroesophageal reflux disease 02/05/2020   Obstructive sleep apnea treated with continuous positive airway pressure (CPAP) 06/16/2017   Perennial allergic rhinitis with a nonallergic component 01/31/2017   Asthmatic bronchitis 01/31/2017   GI symptoms 01/31/2017   Family history of colon cancer 01/27/2015   Chest pain 12/13/2012   Dyspnea 12/13/2012   DVT of lower extremity (deep venous thrombosis) (HCC) 01/03/1990   PCP: Gaile New, MD  REFERRING PROVIDER: Valery Ripple, MD  REFERRING DIAG: I89.0  THERAPY DIAG:  Lymphedema, not elsewhere classified  Rationale for Evaluation and Treatment: Rehabilitation  ONSET DATE: ~2014; exacerbation in 2023  SUBJECTIVE:                                                                                                                                                                                          SUBJECTIVE STATEMENT:  Pt was last seen on 12/15 prior to holiday vacation. Pt states she has been doing pretty well during visit interval. Her daughter accompanied her today for a second instructional session on deep abdominal MLD. Pt denies LE related pain.  PERTINENT HISTORY: CVI, OSA (no CPAP), GERD, Esophageal stricture, IBS, Lactose intolerant, Diarrhea, Hx LE DVT, recent RA dx, OA  PAIN:  Are you having LE-related pain? No Pain location: abdomen; digestive discomfort; B leg fullness Pain description: abdominal fullness, tightness Aggravating factors: standing, walking,  Relieving factors: MLD, Miralax ,   PRECAUTIONS: Other: LYMPHEDEMA; Hx LLE DVT  WEIGHT BEARING RESTRICTIONS: No  FALLS:  Has patient fallen in last 6 months? No  LIVING ENVIRONMENT: Lives with: lives with spouse and daughter Lives in: House/apartment Stairs: Yes; Internal: 14 steps; yes and External: 4 steps; yes Has following equipment at home: None  OCCUPATION: [employer] Services Operational Officer, walking, desk work  LEISURE: family time  HAND  DOMINANCE: right   PRIOR LEVEL OF FUNCTION: Independent  PATIENT GOALS: Feel better, be able to be more active without pain, wear preferred street shoes. NEW 08/03/23:  Be able to manage digestive discomfort and gut inflammation symptoms without medications  OBJECTIVE: Note: Objective measures were completed at Evaluation unless otherwise noted.  COGNITION:  Overall cognitive status: Within functional limits for tasks assessed   POSTURE: WFL  LE ROM: WFL  LE MMT: WFL  LYMPHEDEMA ASSESSMENTS: non-Ca related  INFECTIONS: denies cellulitis and wound hx  BLE COMPARATIVE LIMB VOLUMETRICS Initial 11/15/22  LANDMARK RIGHT (dominant)  R LEG (A-D) 3028.9 ml  R THIGH (E-G) 4809.60 ml  R FULL LIMB (A-G) 7838.5 ml  Limb Volume differential (LVD)  %  Volume change since initial %  Volume change overall V  (Blank rows = not tested)  LANDMARK LEFT  (Rx)  L LEG (A-D) 3189.9 ml  L THIGH (E-G) 4959.8 ml  L FULL LIMB (A-G) 8149.7 ml  Limb Volume differential (LVD)  LEG LVD = 5.32%, L>R; THIGH LVD = 3.12%, L>R; And full LLE LVD = 3.97%, L>R.   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  Skin  Description Hyper-Keratosis Peau d' Orange Shiny Tight Fibrotic/ Indurated Fatty Doughy Spongy/ boggy       R>L x  x   Skin dry Flaky WNL Macerated   mildly      Color Redness Varicosities Blanching Hemosiderin Stain Mottled        x   Odor Malodorous Yeast Fungal infection  WNL      x   Temperature Warm Cool wnl     x    Pitting Edema   1+ 2+ 3+ 4+ Non-pitting         x   Girth  Symmetrical Asymmetrical                   Distribution    L>R toes to groin    Stemmer Sign Positive Negative   +L -R   Lymphorrhea History Of:  Present Absent     x    Wounds History Of Present Absent Venous Arterial Pressure Sheer     x        Signs of Infection Redness Warmth Erythema Acute Swelling Drainage Borders                    Sensation Light Touch Deep pressure Hypersensitivity   In tact Impaired In tact Impaired Absent Impaired   x  x  x     Nails WNL   Fungus nail dystrophy   x     Hair Growth Symmetrical Asymmetrical   x    Skin Creases Base of toes  Ankles   Base of Fingers knees       Abdominal pannus Thigh Lobules  Face/neck   x           GAIT: Distance walked: >500' Assistive device utilized: None Level of assistance: Complete Independence Comments: Functional ambulation and transfers [facility]  LYMPHEDEMA LIFE IMPACT SCALE (LLIS): Initial 11/15/22  50%  FOTO functional outcome measure: Deferred. FOTO discontinued.  TODAY'S TREATMENT:  MLD to abdominal lymphatics Pt edu for LE self care  PATIENT EDUCATION:  Continued Pt/ CG edu for how function of cisterna chyli, part of the thoracic duct of deep abdominal lymphatics, assists with digestion by filtering many of the fats and proteins from the digestive system. Sub optimal lymphatic flow in this region may result in constipation, bloating, swelling, etc. MLD helps mobilize these protein and fats , and the rhythmic movement of manual lymphatic drainage stimulates digestive muscles and increase peristalsis. Provided printed resource for reference.  Topics include outcome of comparative limb volumetrics- starting limb volume differentials (LVDs), technology and gradient techniques used for short stretch, multilayer compression wrapping, simple self-MLD, therapeutic  lymphatic pumping exercises, skin/nail care, LE precautions,. compression garment recommendations and specifications, wear and care schedule and compression garment donning / doffing w assistive devices. Discussed progress towards all OT goals since commencing CDT. All questions answered to the Pt's satisfaction. Good return. Person educated: Patient  Education method: Explanation, Demonstration, and Handouts Education comprehension: verbalized understanding, returned demonstration, verbal cues required, and needs further education  [facility] PRECAUTIONS: Pt education re precautions related to use of advanced sequential pneumatic compression device. Pt verbalized understanding that she should never use device on 2 arms/legs simultaneously and should not use device 2 x on same day to avoid overloading her heart with fluid volume return both at present, and in years to come should her medical condition change. Pt instructed to remove device immediately should she experience atypical SOP, light headedness, or acute pain. Pt instructed to discontinue pump if she suspects, or has any infection, blood clot, cellulitis, the flu, corona virus, etc.   HOME EXERCISE PROGRAM: BLE lymphatic pumping there ex using- 1 sets of 10 reps, each exercise in order-  1-2 x daily, bilaterally Simple self MLD 1 x daily Daily skin care to increase hydration, skin mobility and decrease infection risk- can be done during MLD Compression wraps 23/7 during intensive phase of CDT Compression garments/ devices during self-management phase of CDT  ASSESSMENT:  CLINICAL IMPRESSION:No obvious increase in BLE swelling observed today. Provided additional education on manual lymphatic drainage techniques so family caregiver can provide some relief for digestive discomfort associated with chronic constipation alternating with diarrhea at home.  Pt tolerated the abdominal sequence as established without increased pain. Daughter is able  to perform deep abdominal MLD with gentle short neck sequence while Pt performs diaphragmatic breathing with min assist by end of session. Pt in agreement with plan to reduce OT frequency for lymphedema care to every other week for a couple of visits, then further decrease frequency to PRN status as she has reached a clinical plateau in progress.   (10/30/23 OT Progress Note: Pls review GOALS section below for additional details regarding progress towards goals. Pt understands and can apply lymphedema precautions after skilled training. Pt remains 100% compliant with lymphedema self care. Because lymphatic massage to one's own deep abdomen is challenging, especially with orthopedic issues Pt has had this year, Pt's daughter is learning to assist her mom with simple self MLD. Pt has also undergone a trial of the advanced Flexitouch PLUS sequential pneumatic compression device which features an abdominal, truncal, buttocks component above the level of the inguinal LN to facilitate fluid congestion towards the thoracic duct and terminus. Pt felt this helpful to her during the trial and she's hoping for insurance approval. Pt has obtained the recommended knee length compression garment and wears it for prolonged walking and dependent positioning. Limb volume measurement in  the lower extremity for comparison was not performed today due to time constraints, but by visual assessment it appears unchanged. Abdominal swelling and tightness is also unchanged by visual assessment and palpation. Pt has reached a clinical plateau and he agrees with plan to taper treatment to transition to LE self management phase of CDT. After her next session we'll reduce OT for every other week for a couple of weeks, then further reduce PRN.   (11/15/22 Initial Evaluation: Gwyneth Fernandez is a 54 y.o. female presenting with very mild, stage II, LLE lymphedema 2/2 venous insufficiency and obesity. L Leg swelling and associated pain swelling  fluctuates. It has progressed over time, and now no longer resolves with elevation over night. RLE swelling limits balance during functional ambulation. It exacerbates infection and falls risk. LLE/RLQ lymphedema interferes with functional performance in all occupational domains, including basic and instrumental ADLs, productive activities, leisure pursuits and social participation. Pt will benefit from Occupational Therapy for Complete Decongestive Therapy (CDT) to restore function, to reduce physical and psychologic suffering associated with chronic, progressive lymphedema and associated pain, and to limit infection. CDT will include manual lymphatic drainage (MLD), skin care, therapeutic exercise and compression wraps and garment's devices. Without skilled OT for lymphedema care the condition will worsen and further functional decline is expected  Custom-made gradient compression garments and HOS devices are medically necessary because they are uniquely sized and shaped to fit the exact dimensions of the affected extremities, and to provide appropriate medical grade, graduated compression essential for optimally managing chronic, progressive lymphedema. Multiple custom compression garments are needed to ensure proper hygiene to limit infection risk. Custom compression garments should be replaced q 3-6 months When worn consistently for optimal lipo-lymphedema self-management over time. HOS devices, medically necessary to limit fibrosis buildup in tissue, should be replaced q 2 years and PRN when worn out.      OBJECTIVE IMPAIRMENTS: decreased activity tolerance, decreased knowledge of condition, decreased knowledge of use of DME, increased edema, impaired sensation, pain, and chronic, progressive leg swelling.   ACTIVITY LIMITATIONS: dependent sitting, extended standing and/ or walking, squatting, and lower body dressing, fitting preferred street shoes  PARTICIPATION LIMITATIONS: shopping, community  activity, occupation, and yard work  PERSONAL FACTORS: 3+ comorbidities: Hx DVT,  OSA, CVI. Varicose veins are also affecting patient's functional outcome.   REHAB POTENTIAL: Good  EVALUATION COMPLEXITY: Moderate   GOALS: Goals reviewed with patient? Yes  SHORT TERM GOALS: Target date: 4th OT Rx visit  SHORT TERM GOALS: Target date: 4th OT Rx visit   Pt will demonstrate understanding of lymphedema precautions and prevention strategies with modified independence using a printed reference to identify at least 5 precautions and discussing how s/he may implement them into daily life to reduce risk of progression with extra time. Baseline:Max A Goal status: GOAL MET   Pt will be able to apply multilayer, knee length, gradient, compression wraps to one leg at a time with modified assistance (extra time and assistive device/s) to decrease limb volume, to limit infection risk, and to limit lymphedema progression.  Baseline: Dependent Goal status: GOAL DISCHARGED. Pt Going to compression garments instead  LONG TERM GOALS: Target date: 02/08/23  Given this patient's Intake score of 67%/100% on the functional outcomes FOTO tool, patient will experience an increase in function of 5 points to improve basic and instrumental ADLs performance, including lymphedema self-care.  Baseline: Max A Goal status:GOAL DISCHARGED.  FOTO TOOL DISCONTINUED AT CLINIC   Given this patient's Intake score of 50%  on the Lymphedema Life Impact Scale (LLIS), patient will experience a reduction of at least 5 points in her perceived level of functional impairment resulting from lymphedema to improve functional performance and quality of life (QOL). Baseline: 50% Goal status: PROGRESSING  Pt will achieve at least a 10% volume reduction in B legs to return limb to typical size and shape, to limit infection risk and LE progression, to decrease pain, to improve function. Baseline: Dependent Goal status: PROGRESSING  4.   Pt will obtain proper compression garments/devices and achieve modified independence (extra time + assistive devices) with donning/doffing to optimize limb volume reductions and limit LE  progression over time. Baseline:  Goal status:GOAL MET  5.  During Intensive phase CDT , with modified independence, Pt will achieve at least 85% compliance with all lymphedema self-care home program components, including daily skin care, compression wraps and /or garments, simple self MLD and lymphatic pumping therex to habituate LE self care protocol  into ADLs for optimal LE self-management over time. Baseline: Dependent Goal status:GOAL MET   PLAN:  OT FREQUENCY: every other week for 1 month, then PRN  OT DURATION: 12 weeks and PRN  PLANNED INTERVENTIONS: 97110-Therapeutic exercises, 97530- Therapeutic activity, 97535- Self Care, [id number]- Manual therapy, Patient/Family education, Manual lymph drainage, Compression bandaging, and fit with appropriate compression garments  PLAN FOR NEXT SESSION:  MLD as established Pt and family edu for LE self-care  Zebedee Dec, MS, OTR/L, CLT-LANA 01/08/2024 12:34 PM  "

## 2024-01-09 ENCOUNTER — Ambulatory Visit: Payer: Self-pay | Attending: Orthopedic Surgery

## 2024-01-09 DIAGNOSIS — R102 Pelvic and perineal pain unspecified side: Secondary | ICD-10-CM | POA: Diagnosis present

## 2024-01-09 DIAGNOSIS — I890 Lymphedema, not elsewhere classified: Secondary | ICD-10-CM | POA: Diagnosis present

## 2024-01-09 DIAGNOSIS — M6281 Muscle weakness (generalized): Secondary | ICD-10-CM | POA: Diagnosis present

## 2024-01-09 DIAGNOSIS — R279 Unspecified lack of coordination: Secondary | ICD-10-CM | POA: Insufficient documentation

## 2024-01-09 DIAGNOSIS — M62838 Other muscle spasm: Secondary | ICD-10-CM | POA: Diagnosis present

## 2024-01-09 DIAGNOSIS — M5459 Other low back pain: Secondary | ICD-10-CM | POA: Insufficient documentation

## 2024-01-09 NOTE — Therapy (Signed)
 " OUTPATIENT PHYSICAL THERAPY FEMALE PELVIC TREATMENT     Patient Name: Karen [name] MRN: [id number] DOB:[birth year], 53 y.o., female Today's Date: 01/09/2024  END OF SESSION:  PT End of Session - 01/09/24 1619     Authorization Type Self-pay    PT Start Time 1615    PT Stop Time 1655    PT Time Calculation (min) 40 min    Activity Tolerance Patient tolerated treatment well    Behavior During Therapy [facility] for tasks assessed/performed                      Past Medical History:  Diagnosis Date   Adenomyosis    Anemia    Arthritis 2013   L knee gets steroid injections    Arthritis, rheumatic, acute or subacute    Asthmatic bronchitis 01/31/2017   Back pain    Chronic constipation    Diarrhea    DVT of lower extremity (deep venous thrombosis) (HCC)    Dysmenorrhea    Endometriosis    Esophageal stricture    G6PD deficiency    GERD (gastroesophageal reflux disease)    History of kidney stones    History of uterine fibroid    IBS (irritable bowel syndrome)    Interstitial cystitis    Lactose intolerance    Migraine    with aura   Other hemochromatosis 06/10/2021   PONV (postoperative nausea and vomiting)    Recurrent upper respiratory infection (URI)    Sebaceous cyst of breast, right lower inner quadrant 10/25/2012   Excised 11/21/12    Sleep apnea    Syncope, non cardiac    Past Surgical History:  Procedure Laterality Date   ARTHROSCOPIC HAGLUNDS REPAIR     BREAST CYST EXCISION Right 11/21/2012   Procedure: CYST EXCISION BREAST;  Surgeon: Sherlean JINNY Laughter, MD;  Location: [facility];  Service: General;  Laterality: Right;   BREAST CYST EXCISION Bilateral 01/18/2022   Procedure: EXCISION OF SEBACEOUS CYST BILATERAL BREAST;  Surgeon: Vanderbilt Ned, MD;  Location: [facility];  Service: General;  Laterality: Bilateral;   BREAST EXCISIONAL BIOPSY Right 11/2012   CESAREAN SECTION  04/19/1993   CHOLECYSTECTOMY      COLONOSCOPY  05/02/2011   Procedure: COLONOSCOPY;  Surgeon: Lynwood LITTIE Celestia Mickey., MD;  Location: [facility];  Service: Endoscopy;  Laterality: N/A;   colonoscopy  11/04/2014   DENTAL SURGERY  03/06/2018   2 surgeries ( 03/06/2018 and 04/06/2018)   to remove two separate benign tumors   ESOPHAGEAL MANOMETRY N/A 11/04/2015   Procedure: ESOPHAGEAL MANOMETRY (EM);  Surgeon: Lynwood Celestia, MD;  Location: [facility];  Service: Endoscopy;  Laterality: N/A;   ESOPHAGOGASTRODUODENOSCOPY  05/02/2011   Procedure: ESOPHAGOGASTRODUODENOSCOPY (EGD);  Surgeon: Lynwood LITTIE Celestia Mickey., MD;  Location: [facility];  Service: Endoscopy;  Laterality: N/A;   ESOPHAGOGASTRODUODENOSCOPY N/A 09/01/2014   Procedure: ESOPHAGOGASTRODUODENOSCOPY (EGD);  Surgeon: Lynwood Celestia, MD;  Location: [facility];  Service: Endoscopy;  Laterality: N/A;   IR CV LINE INJECTION  09/13/2023   IR CV LINE INJECTION  12/01/2023   IR IMAGING GUIDED PORT INSERTION  07/06/2023   IR IMAGING GUIDED PORT INSERTION  12/01/2023   IR REMOVAL TUN ACCESS W/ PORT W/O FL MOD SED  12/01/2023   KNEE SURGERY     LAPAROSCOPY     x 4   LESION REMOVAL N/A 01/18/2022   Procedure: EXCISION OF SEBACEOUS CYSTS CHEST AND NECK;  Surgeon: Vanderbilt Ned, MD;  Location:   [facility];  Service: General;  Laterality: N/A;   PH IMPEDANCE STUDY N/A 11/04/2015   Procedure: PH IMPEDANCE STUDY;  Surgeon: Lynwood Bohr, MD;  Location: [facility];  Service: Endoscopy;  Laterality: N/A;   VAGINAL HYSTERECTOMY  2010   TVH--ovaries remain   Patient Active Problem List   Diagnosis Date Noted   Sjogren syndrome with keratoconjunctivitis 12/09/2022   Hemochromatosis 06/10/2021   Elevated LFTs 04/04/2021   Colitis 04/04/2021   Bacterial overgrowth syndrome 05/21/2020   Obesity 05/21/2020   History of endometriosis 05/21/2020   Constipation due to outlet dysfunction 02/05/2020   Gastroesophageal reflux disease 02/05/2020   Obstructive sleep apnea treated  with continuous positive airway pressure (CPAP) 06/16/2017   Perennial allergic rhinitis with a nonallergic component 01/31/2017   Asthmatic bronchitis 01/31/2017   GI symptoms 01/31/2017   Family history of colon cancer 01/27/2015   Chest pain 12/13/2012   Dyspnea 12/13/2012   DVT of lower extremity (deep venous thrombosis) (HCC) 01/03/1990    PCP: Elliot Charm, MD  REFERRING PROVIDER: Cleotilde Ronal RAMAN, MD   REFERRING DIAG: M62.89 (ICD-10-CM) - Pelvic floor dysfunction  THERAPY DIAG:  Muscle weakness (generalized)  Other low back pain  Other muscle spasm  Pelvic pain  Unspecified lack of coordination  Rationale for Evaluation and Treatment: Rehabilitation  ONSET DATE: chronic  SUBJECTIVE:                                                                                                                                                                                           SUBJECTIVE STATEMENT:  Pt was in iron overload since last visit and had to have phlebotomy. Echo showed some plaques in heart and she is scheduled for cardiac MRI this week. She is still on antibiotics for SIBO. She did start aquatic therapy and feels like it made her low back hurt.   Husband did massage techniques one time and she states that he did well.   PAIN: 01/09/2024 Are you having pain? Yes NPRS scale: 4/10 Pain location: Lower abdominal pain, Lt sided pain, low back pain  Pain type: turning, knotted, pressure Pain description: intermittent and constant   Aggravating factors: constipation or frequent bowel movements/constantly having bowel movements  Relieving factors: exercises (bowel massage, happy baby, spinal twists, walking)  PRECAUTIONS: None  WEIGHT BEARING RESTRICTIONS: No  FALLS:  Has patient fallen in last 6 months? No  LIVING ENVIRONMENT: Lives with: lives with their spouse Lives in: House/apartment  OCCUPATION: nurse, in admin  PLOF: Independent  PATIENT  GOALS: decrease pain and have more normal bowel movements  PERTINENT HISTORY:  Vaginal hysterectomy, DVT  LE, endometriosis, interstitial cystitis, exploratory laps for endometriosis, c-section, cholecystectomy, RA diagnosed 06/2023 Sexual abuse: No  BOWEL MOVEMENT: Pain with bowel movement: Yes Type of bowel movement:Type (Bristol Stool Scale) 1-7 (sometimes full range in the same bowel movement), Frequency sometimes many times a day, sometimes up to 4 days in between, and Strain Yes Fully empty rectum: No Leakage: No Pads: No Fiber supplement: No - has attempted metamucil and it made constipation worse  URINATION: Pain with urination: Yes Fully empty bladder: No Stream: varies  Urgency: Yes: all the time Frequency: multiple times an hours  Leakage: Urge to void, Walking to the bathroom, Coughing, Sneezing, Laughing, and Exercise Pads: Yes: daily  INTERCOURSE: Pain with intercourse: Initial Penetration and During Penetration Ability to have vaginal penetration:  Yes: with pain Climax: non painful WNL   PREGNANCY: Vaginal deliveries 1 Tearing Yes: 3rd degree tear C-section deliveries 1 Currently pregnant No  PROLAPSE: Periodically will feel vaginal bulging, heaviness in lower abdomen   OBJECTIVE:  10/10/23 Curl-up test: Minimal abdominal distortion focused in upper abdominals, largely improved Transversus abdominus: good core facilitation with exhale Sit-up test: 1/3  LUMBARAROM/PROM:  A/PROM A/PROM  Eval (% available) 11/09/22 (% available) 04/25/23 (% available) 10/10/23 (% available)  Flexion 50 90 90 100  Extension 25, pain anteriorly  25, pain in low back 50 75  Right lateral flexion 50, pain anteriorly  75, pain on right 50, pain on Rt 75  Left lateral flexion 50, pain anteriorly  75, pain on right 75 75   (Blank rows = not tested)    05/04/23:               Internal Pelvic Floor: mild tenderness in bil levator ani, but no increase in tension; with internal  rectal exam, she was demonstrating paradoxical contraction when trying to eccentrically lengthen with exhale and had very hard time correcting  Patient confirms identification and approves PT to assess internal pelvic floor and treatment Yes  PELVIC MMT:   MMT eval  Vaginal 2/5, 3 second hold, 3 repeat contractions  Internal Anal Sphincter 1/5  External Anal Sphincter 2/5  Puborectalis 1/5  Diastasis Recti   (Blank rows = not tested)        TONE: WNL   PROLAPSE: Grade 2 anterior vaginal wall laxity  04/25/23: Curl-up test: abdominal distortion Transversus abdominus: week, difficulty getting active contraction (not been working on strengthening)    LUMBARAROM/PROM:  A/PROM A/PROM  Eval (% available) 11/09/22 (% available) 04/25/23 (% available)  Flexion 50 90 90  Extension 25, pain anteriorly  25, pain in low back 50  Right lateral flexion 50, pain anteriorly  75, pain on right 50, pain on Rt  Left lateral flexion 50, pain anteriorly  75, pain on right 75   (Blank rows = not tested)  11/09/22: No internal rectal or vaginal pelvic floor exam performed due to high level of skin irritation   Weak transversus abdominus contraction  Abdominal restriction and tenderness in bil lower quadrant  Unable to perform pelvic tilt with appropriate coordination  LUMBARAROM/PROM:  A/PROM A/PROM  Eval (% available) 11/09/22 (% available)  Flexion 50 90  Extension 25, pain anteriorly  25, pain in low back  Right lateral flexion 50, pain anteriorly  75, pain on right  Left lateral flexion 50, pain anteriorly  75, pain on right   (Blank rows = not tested) 07/21/22: standing prolapse assessment demonstrated grade 2 anterior vaginal wall laxity   06/08/22:  External Perineal Exam: WNL                              Internal Pelvic Floor: burning reported with palpation of superficial muscles; deep aching/pressure with palpation of Lt levator ani   Patient confirms  identification and approves PT to assess internal pelvic floor and treatment Yes  PELVIC MMT:   MMT eval  Vaginal 3/5  Internal Anal Sphincter 1/5  External Anal Sphincter 2/5  Puborectalis 1/5  Diastasis Recti   (Blank rows = not tested)        TONE: High in Lt levator ani   PROLAPSE: Not able to tell this session due to dyssynergic pelvic floor contraction     06/01/22: COGNITION: Overall cognitive status: Within functional limits for tasks assessed     SENSATION: Light touch: Appears intact Proprioception: Appears intact  GAIT: Comments: decreased hip extension, forward flexed trunk  POSTURE: rounded shoulders, forward head, decreased lumbar lordosis, increased thoracic kyphosis, posterior pelvic tilt, and flexed trunk    LUMBARAROM/PROM:  A/PROM A/PROM  Eval (% available)  Flexion 50  Extension 25, pain anteriorly   Right lateral flexion 50, pain anteriorly   Left lateral flexion 50, pain anteriorly    (Blank rows = not tested)   PALPATION:   General  Significant abdominal restriction and tenderness; decreased rib mobility with mobilization and breathing    TODAY'S TREATMENT:                                                                                                                              DATE:  01/09/2023 Manual: Supine: ILU bowel mobilization  Abdominal scar tissue mobilization Ileocecal valve mobilization External rectum mobilization Diaphragm release ILU mobilization Prone: Myofascial release to low back Soft tissue mobilization to lumbar paraspinals and glutes 12/25/23 Manual: Supine: ILU bowel mobilization  Abdominal scar tissue mobilization Ileocecal valve mobilization External rectum mobilization Diaphragm release ILU mobilization Prone: Myofascial release to low back Soft tissue mobilization to lumbar paraspinals and glutes  Therapeutic activities: Partner education on bowel mobilization techniques     12/20/23 Manual: Supine: ILU bowel mobilization  Abdominal scar tissue mobilization Ileocecal valve mobilization External rectum mobilization Diaphragm release Therapeutic activities: Squats to table 2 x 10 3 way kick 10x each bil Side stepping + blue loop 3 laps  Heel raises 3 x 10 Staggered deadlift and pt education through bending in this position when performing functional tasks 10x bil  PATIENT EDUCATION:  Education details: See above Person educated: Patient Education method: Explanation, Demonstration, Tactile cues, Verbal cues, and Handouts Education comprehension: verbalized understanding  HOME EXERCISE PROGRAM: [id number]  ASSESSMENT:  CLINICAL IMPRESSION: Due to higher level of discomfort, we focused of manual techniques this treatment session. She tolerated well and reported good decrease in pain at end of session. We continued discharge planning and agree next session will be our last session due to independence  from a pelvic floor perspective at this time. She will continue to benefit from skilled PT intervention in order to decrease pain, improve bowel movements, and promote independence with HEP.     OBJECTIVE IMPAIRMENTS: decreased activity tolerance, decreased coordination, decreased endurance, decreased mobility, decreased strength, increased fascial restrictions, increased muscle spasms, impaired tone, postural dysfunction, and pain.   ACTIVITY LIMITATIONS: lifting, bending, continence, and locomotion level  PARTICIPATION LIMITATIONS: interpersonal relationship, community activity, and occupation  PERSONAL FACTORS: 1 comorbidity: medical history are also affecting patient's functional outcome.   REHAB POTENTIAL: Good  CLINICAL DECISION MAKING: Stable/uncomplicated  EVALUATION COMPLEXITY: Low   GOALS: Goals reviewed with patient? Yes  SHORT TERM GOALS: Updated 10/10/23  Pt will be independent with HEP.   Baseline: Goal status: MET 07/12/22  2.   Pt will be independent with diaphragmatic breathing and down training activities in order to improve pelvic floor relaxation.  Baseline:  Goal status: MET 07/12/22  3.  Pt will be independent with the knack, urge suppression technique, and double voiding in order to improve bladder habits and decrease urinary incontinence.   Baseline:  Goal status: MET 09/22/22  4.  Pt will be independent with use of squatty potty, relaxed toileting mechanics, and improved bowel movement techniques in order to increase ease of bowel movements and complete evacuation.   Baseline:  Goal status: MET 11/09/22  5.  Pt will be able to correctly perform diaphragmatic breathing and appropriate pressure management in order to prevent worsening vaginal wall laxity and improve pelvic floor A/ROM.   Baseline:  Goal status: MET 01/18/23  LONG TERM GOALS: Updated 10/10/23  Pt will be independent with advanced HEP.   Baseline:  Goal status: IN PROGRESS 12/20/23  2.  Pt will demonstrate normal pelvic floor muscle tone and A/ROM, able to achieve 4/5 strength with contractions and 10 sec endurance, in order to provide appropriate lumbopelvic support in functional activities.   Baseline: not assessed today per patient request - no current bladder issues and they have all resolved Goal status: IN PROGRESS 12/20/23  3.  Pt will increase all impaired lumbar A/ROM by 25% without pain.  Baseline: Pt seen a little bit of loss in some and improvement in extension Goal status:  IN PROGRESS 12/20/23  4.  Pt will report pain no higher than 3/10 with any activity. Baseline: pain currently 3/10, worst is an 8/10 and often corresponds to after she is eating Goal status:  IN PROGRESS 12/20/23  5.  Pt will report 0/10 pain with vaginal penetration in order to improve intimate relationship with partner.    Baseline: no recent intercourse attempts  Goal status:  IN PROGRESS 12/20/23  6.  Pt will be able to go 2-3 hours in  between voids without urgency or incontinence in order to improve QOL and perform all functional activities with less difficulty.   Baseline:  Goal status: MET 02/21/23  7.  Pt will report no leaks with laughing, coughing, sneezing in order to improve comfort with interpersonal relationships and community activities.   Baseline: Pt states that she feels 30-40% better Goal status:  MET 10/10/23  8.  Pt will have consistent bowel movements 4-5x/week without straining or pain.  Baseline:  Goal status:  MET 02/21/23  PLAN:  PT FREQUENCY: 1-2x/week  PT DURATION: 6 months  PLANNED INTERVENTIONS: Therapeutic exercises, Therapeutic activity, Neuromuscular re-education, Balance training, Gait training, Patient/Family education, Self Care, Joint mobilization, Dry Needling, Biofeedback, and Manual therapy  PLAN FOR NEXT SESSION: plan  for 01/16/24 to be last visit.   Josette Mares, PT, DPT1/6/20264:21 PM "

## 2024-01-10 ENCOUNTER — Ambulatory Visit: Admitting: Cardiology

## 2024-01-10 ENCOUNTER — Ambulatory Visit: Payer: Self-pay | Admitting: Physical Therapy

## 2024-01-10 ENCOUNTER — Encounter: Payer: Self-pay | Admitting: Physical Therapy

## 2024-01-10 ENCOUNTER — Encounter: Payer: Self-pay | Admitting: Hematology & Oncology

## 2024-01-10 DIAGNOSIS — R102 Pelvic and perineal pain unspecified side: Secondary | ICD-10-CM

## 2024-01-10 DIAGNOSIS — M6281 Muscle weakness (generalized): Secondary | ICD-10-CM

## 2024-01-10 DIAGNOSIS — R279 Unspecified lack of coordination: Secondary | ICD-10-CM

## 2024-01-10 DIAGNOSIS — M62838 Other muscle spasm: Secondary | ICD-10-CM

## 2024-01-10 DIAGNOSIS — M5459 Other low back pain: Secondary | ICD-10-CM

## 2024-01-10 NOTE — Therapy (Signed)
 " OUTPATIENT PHYSICAL THERAPY FEMALE PELVIC TREATMENT     Patient Name: Karen [name] MRN: [id number] DOB:03/03/1970, 54 y.o., female Today's Date: 01/10/2024  END OF SESSION:  PT End of Session - 01/10/24 0901     Visit Number 64    Date for Recertification  03/26/24    Authorization Type Self-pay    Authorization Time Period signed dry needling consent form 08/08/23    PT Start Time 0900   15 min late   PT Stop Time 0928    PT Time Calculation (min) 28 min    Activity Tolerance Patient tolerated treatment well    Behavior During Therapy [facility] for tasks assessed/performed                       Past Medical History:  Diagnosis Date   Adenomyosis    Anemia    Arthritis 2013   L knee gets steroid injections    Arthritis, rheumatic, acute or subacute    Asthmatic bronchitis 01/31/2017   Back pain    Chronic constipation    Diarrhea    DVT of lower extremity (deep venous thrombosis) (HCC)    Dysmenorrhea    Endometriosis    Esophageal stricture    G6PD deficiency    GERD (gastroesophageal reflux disease)    History of kidney stones    History of uterine fibroid    IBS (irritable bowel syndrome)    Interstitial cystitis    Lactose intolerance    Migraine    with aura   Other hemochromatosis 06/10/2021   PONV (postoperative nausea and vomiting)    Recurrent upper respiratory infection (URI)    Sebaceous cyst of breast, right lower inner quadrant 10/25/2012   Excised 11/21/12    Sleep apnea    Syncope, non cardiac    Past Surgical History:  Procedure Laterality Date   ARTHROSCOPIC HAGLUNDS REPAIR     BREAST CYST EXCISION Right 11/21/2012   Procedure: CYST EXCISION BREAST;  Surgeon: Sherlean JINNY Laughter, MD;  Location: [facility];  Service: General;  Laterality: Right;   BREAST CYST EXCISION Bilateral 01/18/2022   Procedure: EXCISION OF SEBACEOUS CYST BILATERAL BREAST;  Surgeon: Vanderbilt Ned, MD;  Location:  SURGERY CENTER;   Service: General;  Laterality: Bilateral;   BREAST EXCISIONAL BIOPSY Right 11/2012   CESAREAN SECTION  04/19/1993   CHOLECYSTECTOMY     COLONOSCOPY  05/02/2011   Procedure: COLONOSCOPY;  Surgeon: Lynwood LITTIE Celestia Mickey., MD;  Location: [facility];  Service: Endoscopy;  Laterality: N/A;   colonoscopy  11/04/2014   DENTAL SURGERY  03/06/2018   2 surgeries ( 03/06/2018 and 04/06/2018)   to remove two separate benign tumors   ESOPHAGEAL MANOMETRY N/A 11/04/2015   Procedure: ESOPHAGEAL MANOMETRY (EM);  Surgeon: Lynwood Celestia, MD;  Location: [facility];  Service: Endoscopy;  Laterality: N/A;   ESOPHAGOGASTRODUODENOSCOPY  05/02/2011   Procedure: ESOPHAGOGASTRODUODENOSCOPY (EGD);  Surgeon: Lynwood LITTIE Celestia Mickey., MD;  Location: [facility];  Service: Endoscopy;  Laterality: N/A;   ESOPHAGOGASTRODUODENOSCOPY N/A 09/01/2014   Procedure: ESOPHAGOGASTRODUODENOSCOPY (EGD);  Surgeon: Lynwood Celestia, MD;  Location: [facility];  Service: Endoscopy;  Laterality: N/A;   IR CV LINE INJECTION  09/13/2023   IR CV LINE INJECTION  12/01/2023   IR IMAGING GUIDED PORT INSERTION  07/06/2023   IR IMAGING GUIDED PORT INSERTION  12/01/2023   IR REMOVAL TUN ACCESS W/ PORT W/O FL MOD SED  12/01/2023   KNEE SURGERY  LAPAROSCOPY     x 4   LESION REMOVAL N/A 01/18/2022   Procedure: EXCISION OF SEBACEOUS CYSTS CHEST AND NECK;  Surgeon: Vanderbilt Ned, MD;  Location: [facility];  Service: General;  Laterality: N/A;   PH IMPEDANCE STUDY N/A 11/04/2015   Procedure: PH IMPEDANCE STUDY;  Surgeon: Lynwood Bohr, MD;  Location: [facility];  Service: Endoscopy;  Laterality: N/A;   VAGINAL HYSTERECTOMY  2010   TVH--ovaries remain   Patient Active Problem List   Diagnosis Date Noted   Sjogren syndrome with keratoconjunctivitis 12/09/2022   Hemochromatosis 06/10/2021   Elevated LFTs 04/04/2021   Colitis 04/04/2021   Bacterial overgrowth syndrome 05/21/2020   Obesity 05/21/2020   History of endometriosis  05/21/2020   Constipation due to outlet dysfunction 02/05/2020   Gastroesophageal reflux disease 02/05/2020   Obstructive sleep apnea treated with continuous positive airway pressure (CPAP) 06/16/2017   Perennial allergic rhinitis with a nonallergic component 01/31/2017   Asthmatic bronchitis 01/31/2017   GI symptoms 01/31/2017   Family history of colon cancer 01/27/2015   Chest pain 12/13/2012   Dyspnea 12/13/2012   DVT of lower extremity (deep venous thrombosis) (HCC) 01/03/1990    PCP: Elliot Charm, MD  REFERRING PROVIDER: Cleotilde Ronal RAMAN, MD   REFERRING DIAG: M62.89 (ICD-10-CM) - Pelvic floor dysfunction  THERAPY DIAG:  Muscle weakness (generalized)  Other low back pain  Pelvic pain  Other muscle spasm  Unspecified lack of coordination  Rationale for Evaluation and Treatment: Rehabilitation  ONSET DATE: chronic  SUBJECTIVE:                                                                                                                                                                                           SUBJECTIVE STATEMENT: About a 5/10 back pain today  Husband did massage techniques one time and she states that he did well.   PAIN: 01/09/2024 Are you having pain? Yes NPRS scale: 5/10 Pain location: Lower abdominal pain, Lt sided pain, low back pain  Pain type: turning, knotted, pressure Pain description: intermittent and constant   Aggravating factors: constipation or frequent bowel movements/constantly having bowel movements  Relieving factors: exercises (bowel massage, happy baby, spinal twists, walking)  PRECAUTIONS: None  WEIGHT BEARING RESTRICTIONS: No  FALLS:  Has patient fallen in last 6 months? No  LIVING ENVIRONMENT: Lives with: lives with their spouse Lives in: House/apartment  OCCUPATION: nurse, in admin  PLOF: Independent  PATIENT GOALS: decrease pain and have more normal bowel movements  PERTINENT HISTORY:  Vaginal  hysterectomy, DVT LE, endometriosis, interstitial cystitis, exploratory laps for endometriosis, c-section, cholecystectomy, RA diagnosed 06/2023 Sexual abuse:  No  BOWEL MOVEMENT: Pain with bowel movement: Yes Type of bowel movement:Type (Bristol Stool Scale) 1-7 (sometimes full range in the same bowel movement), Frequency sometimes many times a day, sometimes up to 4 days in between, and Strain Yes Fully empty rectum: No Leakage: No Pads: No Fiber supplement: No - has attempted metamucil and it made constipation worse  URINATION: Pain with urination: Yes Fully empty bladder: No Stream: varies  Urgency: Yes: all the time Frequency: multiple times an hours  Leakage: Urge to void, Walking to the bathroom, Coughing, Sneezing, Laughing, and Exercise Pads: Yes: daily  INTERCOURSE: Pain with intercourse: Initial Penetration and During Penetration Ability to have vaginal penetration:  Yes: with pain Climax: non painful WNL   PREGNANCY: Vaginal deliveries 1 Tearing Yes: 3rd degree tear C-section deliveries 1 Currently pregnant No  PROLAPSE: Periodically will feel vaginal bulging, heaviness in lower abdomen   OBJECTIVE:  10/10/23 Curl-up test: Minimal abdominal distortion focused in upper abdominals, largely improved Transversus abdominus: good core facilitation with exhale Sit-up test: 1/3  LUMBARAROM/PROM:  A/PROM A/PROM  Eval (% available) 11/09/22 (% available) 04/25/23 (% available) 10/10/23 (% available)  Flexion 50 90 90 100  Extension 25, pain anteriorly  25, pain in low back 50 75  Right lateral flexion 50, pain anteriorly  75, pain on right 50, pain on Rt 75  Left lateral flexion 50, pain anteriorly  75, pain on right 75 75   (Blank rows = not tested)    05/04/23:               Internal Pelvic Floor: mild tenderness in bil levator ani, but no increase in tension; with internal rectal exam, she was demonstrating paradoxical contraction when trying to eccentrically  lengthen with exhale and had very hard time correcting  Patient confirms identification and approves PT to assess internal pelvic floor and treatment Yes  PELVIC MMT:   MMT eval  Vaginal 2/5, 3 second hold, 3 repeat contractions  Internal Anal Sphincter 1/5  External Anal Sphincter 2/5  Puborectalis 1/5  Diastasis Recti   (Blank rows = not tested)        TONE: WNL   PROLAPSE: Grade 2 anterior vaginal wall laxity  04/25/23: Curl-up test: abdominal distortion Transversus abdominus: week, difficulty getting active contraction (not been working on strengthening)    LUMBARAROM/PROM:  A/PROM A/PROM  Eval (% available) 11/09/22 (% available) 04/25/23 (% available)  Flexion 50 90 90  Extension 25, pain anteriorly  25, pain in low back 50  Right lateral flexion 50, pain anteriorly  75, pain on right 50, pain on Rt  Left lateral flexion 50, pain anteriorly  75, pain on right 75   (Blank rows = not tested)  11/09/22: No internal rectal or vaginal pelvic floor exam performed due to high level of skin irritation   Weak transversus abdominus contraction  Abdominal restriction and tenderness in bil lower quadrant  Unable to perform pelvic tilt with appropriate coordination  LUMBARAROM/PROM:  A/PROM A/PROM  Eval (% available) 11/09/22 (% available)  Flexion 50 90  Extension 25, pain anteriorly  25, pain in low back  Right lateral flexion 50, pain anteriorly  75, pain on right  Left lateral flexion 50, pain anteriorly  75, pain on right   (Blank rows = not tested) 07/21/22: standing prolapse assessment demonstrated grade 2 anterior vaginal wall laxity   06/08/22:               External Perineal Exam: WNL  Internal Pelvic Floor: burning reported with palpation of superficial muscles; deep aching/pressure with palpation of Lt levator ani   Patient confirms identification and approves PT to assess internal pelvic floor and treatment Yes  PELVIC MMT:    MMT eval  Vaginal 3/5  Internal Anal Sphincter 1/5  External Anal Sphincter 2/5  Puborectalis 1/5  Diastasis Recti   (Blank rows = not tested)        TONE: High in Lt levator ani   PROLAPSE: Not able to tell this session due to dyssynergic pelvic floor contraction     06/01/22: COGNITION: Overall cognitive status: Within functional limits for tasks assessed     SENSATION: Light touch: Appears intact Proprioception: Appears intact  GAIT: Comments: decreased hip extension, forward flexed trunk  POSTURE: rounded shoulders, forward head, decreased lumbar lordosis, increased thoracic kyphosis, posterior pelvic tilt, and flexed trunk    LUMBARAROM/PROM:  A/PROM A/PROM  Eval (% available)  Flexion 50  Extension 25, pain anteriorly   Right lateral flexion 50, pain anteriorly   Left lateral flexion 50, pain anteriorly    (Blank rows = not tested)   PALPATION:   General  Significant abdominal restriction and tenderness; decreased rib mobility with mobilization and breathing    TODAY'S TREATMENT:                                                                                                                              DATE:   01/10/24:Pt arrives for aquatic physical therapy. Treatment took place in 3.5-5.5 feet of water. Water temperature was 91 degrees F. Pt entered the pool via stairs independently using rails. Seated water bench with 75% submersion Pt performed seated LE AROM exercises 20x in all planes, with concurrent discussion of current status. -75% depth water walking 4x each holding the Q-tip for back support.VC for proper heel toe walking as pt was ambulating on her toes with forward walking. -decompression hang 3 min with xtra large white noodle -standing post pelvic tilt  against the wall 3 sec hold 6x, Vc to move/tilt from abdomin -Hip abd 10x Bil -Standing marching 10x Bil   01/09/2023 Manual: Supine: ILU bowel mobilization  Abdominal scar tissue  mobilization Ileocecal valve mobilization External rectum mobilization Diaphragm release ILU mobilization Prone: Myofascial release to low back Soft tissue mobilization to lumbar paraspinals and glutes 12/25/23 Manual: Supine: ILU bowel mobilization  Abdominal scar tissue mobilization Ileocecal valve mobilization External rectum mobilization Diaphragm release ILU mobilization Prone: Myofascial release to low back Soft tissue mobilization to lumbar paraspinals and glutes  Therapeutic activities: Partner education on bowel mobilization techniques    12/20/23 Manual: Supine: ILU bowel mobilization  Abdominal scar tissue mobilization Ileocecal valve mobilization External rectum mobilization Diaphragm release Therapeutic activities: Squats to table 2 x 10 3 way kick 10x each bil Side stepping + blue loop 3 laps  Heel raises 3 x 10 Staggered deadlift and pt education through bending in this position when performing  functional tasks 10x bil  PATIENT EDUCATION:  Education details: See above Person educated: Patient Education method: Explanation, Demonstration, Tactile cues, Verbal cues, and Handouts Education comprehension: verbalized understanding  HOME EXERCISE PROGRAM: [id number]  ASSESSMENT:  CLINICAL IMPRESSION:Pt arrives to aquatic PT with back pain and intermittent leg pain. Exercises did not exacerbate this pain, in fact she reported pain relief throughout the session. Treatment was limited due her being late.  OBJECTIVE IMPAIRMENTS: decreased activity tolerance, decreased coordination, decreased endurance, decreased mobility, decreased strength, increased fascial restrictions, increased muscle spasms, impaired tone, postural dysfunction, and pain.   ACTIVITY LIMITATIONS: lifting, bending, continence, and locomotion level  PARTICIPATION LIMITATIONS: interpersonal relationship, community activity, and occupation  PERSONAL FACTORS: 1 comorbidity: medical history  are also affecting patient's functional outcome.   REHAB POTENTIAL: Good  CLINICAL DECISION MAKING: Stable/uncomplicated  EVALUATION COMPLEXITY: Low   GOALS: Goals reviewed with patient? Yes  SHORT TERM GOALS: Updated 10/10/23  Pt will be independent with HEP.   Baseline: Goal status: MET 07/12/22  2.  Pt will be independent with diaphragmatic breathing and down training activities in order to improve pelvic floor relaxation.  Baseline:  Goal status: MET 07/12/22  3.  Pt will be independent with the knack, urge suppression technique, and double voiding in order to improve bladder habits and decrease urinary incontinence.   Baseline:  Goal status: MET 09/22/22  4.  Pt will be independent with use of squatty potty, relaxed toileting mechanics, and improved bowel movement techniques in order to increase ease of bowel movements and complete evacuation.   Baseline:  Goal status: MET 11/09/22  5.  Pt will be able to correctly perform diaphragmatic breathing and appropriate pressure management in order to prevent worsening vaginal wall laxity and improve pelvic floor A/ROM.   Baseline:  Goal status: MET 01/18/23  LONG TERM GOALS: Updated 10/10/23  Pt will be independent with advanced HEP.   Baseline:  Goal status: IN PROGRESS 12/20/23  2.  Pt will demonstrate normal pelvic floor muscle tone and A/ROM, able to achieve 4/5 strength with contractions and 10 sec endurance, in order to provide appropriate lumbopelvic support in functional activities.   Baseline: not assessed today per patient request - no current bladder issues and they have all resolved Goal status: IN PROGRESS 12/20/23  3.  Pt will increase all impaired lumbar A/ROM by 25% without pain.  Baseline: Pt seen a little bit of loss in some and improvement in extension Goal status:  IN PROGRESS 12/20/23  4.  Pt will report pain no higher than 3/10 with any activity. Baseline: pain currently 3/10, worst is an 8/10 and  often corresponds to after she is eating Goal status:  IN PROGRESS 12/20/23  5.  Pt will report 0/10 pain with vaginal penetration in order to improve intimate relationship with partner.    Baseline: no recent intercourse attempts  Goal status:  IN PROGRESS 12/20/23  6.  Pt will be able to go 2-3 hours in between voids without urgency or incontinence in order to improve QOL and perform all functional activities with less difficulty.   Baseline:  Goal status: MET 02/21/23  7.  Pt will report no leaks with laughing, coughing, sneezing in order to improve comfort with interpersonal relationships and community activities.   Baseline: Pt states that she feels 30-40% better Goal status:  MET 10/10/23  8.  Pt will have consistent bowel movements 4-5x/week without straining or pain.  Baseline:  Goal status:  MET 02/21/23  PLAN:  PT FREQUENCY: 1-2x/week  PT DURATION: 6 months  PLANNED INTERVENTIONS: Therapeutic exercises, Therapeutic activity, Neuromuscular re-education, Balance training, Gait training, Patient/Family education, Self Care, Joint mobilization, Dry Needling, Biofeedback, and Manual therapy  PLAN FOR NEXT SESSION: plan for 01/16/24 to be last visit. Assess response to todays aquatic session.   Delon Darner, PTA 01/10/2024 11:39 AM   "

## 2024-01-11 ENCOUNTER — Encounter (HOSPITAL_COMMUNITY): Payer: Self-pay

## 2024-01-12 ENCOUNTER — Ambulatory Visit (HOSPITAL_COMMUNITY)
Admission: RE | Admit: 2024-01-12 | Discharge: 2024-01-12 | Disposition: A | Source: Ambulatory Visit | Attending: Cardiology

## 2024-01-12 ENCOUNTER — Other Ambulatory Visit: Payer: Self-pay | Admitting: Cardiology

## 2024-01-12 MED ORDER — HEPARIN SOD (PORK) LOCK FLUSH 100 UNIT/ML IV SOLN
500.0000 [IU] | INTRAVENOUS | Status: AC | PRN
  Administered 2024-01-12: 500 [IU]

## 2024-01-12 MED ORDER — GADOBUTROL 1 MMOL/ML IV SOLN
13.0000 mL | Freq: Once | INTRAVENOUS | Status: AC | PRN
  Administered 2024-01-12: 13 mL via INTRAVENOUS

## 2024-01-13 ENCOUNTER — Other Ambulatory Visit (HOSPITAL_COMMUNITY): Payer: Self-pay

## 2024-01-15 ENCOUNTER — Encounter: Payer: Self-pay | Admitting: Hematology & Oncology

## 2024-01-15 ENCOUNTER — Ambulatory Visit: Payer: Self-pay | Admitting: Occupational Therapy

## 2024-01-15 ENCOUNTER — Other Ambulatory Visit: Payer: Self-pay | Admitting: Pharmacy Technician

## 2024-01-15 ENCOUNTER — Other Ambulatory Visit: Payer: Self-pay

## 2024-01-15 ENCOUNTER — Ambulatory Visit (INDEPENDENT_AMBULATORY_CARE_PROVIDER_SITE_OTHER): Admitting: Obstetrics & Gynecology

## 2024-01-15 ENCOUNTER — Other Ambulatory Visit (HOSPITAL_COMMUNITY): Payer: Self-pay

## 2024-01-15 ENCOUNTER — Ambulatory Visit: Payer: Self-pay | Admitting: Cardiology

## 2024-01-15 ENCOUNTER — Encounter (HOSPITAL_BASED_OUTPATIENT_CLINIC_OR_DEPARTMENT_OTHER): Payer: Self-pay | Admitting: Obstetrics & Gynecology

## 2024-01-15 VITALS — BP 121/81 | HR 98 | Ht 67.5 in | Wt 220.8 lb

## 2024-01-15 DIAGNOSIS — D171 Benign lipomatous neoplasm of skin and subcutaneous tissue of trunk: Secondary | ICD-10-CM | POA: Diagnosis not present

## 2024-01-15 DIAGNOSIS — N951 Menopausal and female climacteric states: Secondary | ICD-10-CM

## 2024-01-15 DIAGNOSIS — R822 Biliuria: Secondary | ICD-10-CM | POA: Diagnosis not present

## 2024-01-15 MED ORDER — HUMIRA (2 PEN) 40 MG/0.4ML ~~LOC~~ AJKT
AUTO-INJECTOR | SUBCUTANEOUS | 3 refills | Status: AC
  Filled 2024-01-22 – 2024-01-24 (×2): qty 0.8, 28d supply, fill #0

## 2024-01-15 MED ORDER — HUMIRA (2 PEN) 40 MG/0.4ML ~~LOC~~ AJKT
AUTO-INJECTOR | SUBCUTANEOUS | 3 refills | Status: DC
  Filled 2024-01-15: qty 0.8, 28d supply, fill #0

## 2024-01-15 NOTE — Progress Notes (Unsigned)
" ° °  GYNECOLOGY  VISIT  CC:   discuss several concerns   HPI: 54 y.o. G2P2 Married Black or African American female here for hormonal changes - increased hot flashes and weight gain. H/o DVT so cannot use estrogens.  Non- estrogen treatment options for hot flashes reviewed.  She isn't going to try anything else at this time.  Has lipoma on left side of abdomen has increased in size over last month.  Would like to know if anything should be done.  Also, would like to discuss urine bilirubin levels.  Serum bilirubin normal  Has noticed that her stool and urine has been increased in yellow color.  Given number of phlebotomies she's needed more recently may contribute to seeing bilirubin in urine as hemolysis of RBCs can contribute.  However, I am not an expert in this.  No LMP recorded. Patient has had a hysterectomy.  Past Medical History:  Diagnosis Date   Adenomyosis    Anemia    Arthritis 2013   L knee gets steroid injections    Arthritis, rheumatic, acute or subacute    Asthmatic bronchitis 01/31/2017   Back pain    Chronic constipation    Diarrhea    DVT of lower extremity (deep venous thrombosis) (HCC)    Dysmenorrhea    Endometriosis    Esophageal stricture    G6PD deficiency    GERD (gastroesophageal reflux disease)    History of kidney stones    History of uterine fibroid    IBS (irritable bowel syndrome)    Interstitial cystitis    Lactose intolerance    Migraine    with aura   Other hemochromatosis 06/10/2021   PONV (postoperative nausea and vomiting)    Recurrent upper respiratory infection (URI)    Sebaceous cyst of breast, right lower inner quadrant 10/25/2012   Excised 11/21/12    Sleep apnea    Syncope, non cardiac     MEDS:  Reviewed in EPIC  ALLERGIES: Chlorhexidine , Molds & smuts, Cyanoacrylate, Methotrexate , Tegaderm ag mesh [silver], and Wound dressing adhesive  SH:  married, non smoker  Review of Systems  Constitutional: Negative.   Genitourinary:  Negative.     PHYSICAL EXAMINATION:    BP 121/81 (BP Location: Right Arm, Patient Position: Sitting, Cuff Size: Normal)   Pulse 98   Ht 5' 7.5 (1.715 m)   Wt 220 lb 12.8 oz (100.2 kg)   SpO2 100%   BMI 34.07 kg/m     General appearance: alert, cooperative and appears stated age Abdomen: soft, non-tender; ~2cm lipoma on anterior, slightly left anterior wall noted Lymph:  no inguinal LAD noted  Assessment/Plan: 1. Menopausal symptoms (Primary) - options for treatment discussed - she does have gabapentin  at home.  Dosages and timing reviewed.  Could use nightly starting with 100mg  and titrating up to 400mg  if needed.  Tid dosing also reviewed.  Does not need rx at this time  2. Lipoma of abdominal wall - given size and not visible (can only be felt), feel it is reasonable to monitor this.   3. Bilirubin in urine - possible causes reviewed  Total time with pt:  27 minutes Additional documentation time:  5 minutes Total time:  33 minutes  "

## 2024-01-16 ENCOUNTER — Other Ambulatory Visit: Payer: Self-pay

## 2024-01-16 ENCOUNTER — Ambulatory Visit (HOSPITAL_BASED_OUTPATIENT_CLINIC_OR_DEPARTMENT_OTHER): Attending: Specialist | Admitting: Physical Therapy

## 2024-01-16 ENCOUNTER — Ambulatory Visit

## 2024-01-16 ENCOUNTER — Encounter (HOSPITAL_BASED_OUTPATIENT_CLINIC_OR_DEPARTMENT_OTHER): Payer: Self-pay | Admitting: Physical Therapy

## 2024-01-16 DIAGNOSIS — R102 Pelvic and perineal pain unspecified side: Secondary | ICD-10-CM | POA: Diagnosis present

## 2024-01-16 DIAGNOSIS — M62838 Other muscle spasm: Secondary | ICD-10-CM | POA: Diagnosis present

## 2024-01-16 DIAGNOSIS — M5459 Other low back pain: Secondary | ICD-10-CM | POA: Diagnosis present

## 2024-01-16 DIAGNOSIS — M6281 Muscle weakness (generalized): Secondary | ICD-10-CM | POA: Diagnosis present

## 2024-01-16 NOTE — Therapy (Signed)
 " OUTPATIENT PHYSICAL THERAPY FEMALE PELVIC TREATMENT  Patient Name: Karen [name] MRN: [id number] DOB:November 09, 1970, 54 y.o., female Today's Date: 01/16/2024  END OF SESSION:  PT End of Session - 01/16/24 0936     Visit Number 26    Date for Recertification  02/19/24    Authorization Type [employer]    Authorization Time Period signed dry needling consent form 08/08/23    PT Start Time 0932    PT Stop Time 1011    PT Time Calculation (min) 39 min    Behavior During Therapy [facility] for tasks assessed/performed          Past Medical History:  Diagnosis Date   Adenomyosis    Anemia    Arthritis 2013   L knee gets steroid injections    Arthritis, rheumatic, acute or subacute    Asthmatic bronchitis 01/31/2017   Back pain    Chronic constipation    Diarrhea    DVT of lower extremity (deep venous thrombosis) (HCC)    Dysmenorrhea    Endometriosis    Esophageal stricture    G6PD deficiency    GERD (gastroesophageal reflux disease)    History of kidney stones    History of uterine fibroid    IBS (irritable bowel syndrome)    Interstitial cystitis    Lactose intolerance    Migraine    with aura   Other hemochromatosis 06/10/2021   PONV (postoperative nausea and vomiting)    Recurrent upper respiratory infection (URI)    Sebaceous cyst of breast, right lower inner quadrant 10/25/2012   Excised 11/21/12    Sleep apnea    Syncope, non cardiac    Past Surgical History:  Procedure Laterality Date   ARTHROSCOPIC HAGLUNDS REPAIR     BREAST CYST EXCISION Right 11/21/2012   Procedure: CYST EXCISION BREAST;  Surgeon: Sherlean JINNY Laughter, MD;  Location: [facility];  Service: General;  Laterality: Right;   BREAST CYST EXCISION Bilateral 01/18/2022   Procedure: EXCISION OF SEBACEOUS CYST BILATERAL BREAST;  Surgeon: Vanderbilt Ned, MD;  Location: [facility];  Service: General;  Laterality: Bilateral;   BREAST EXCISIONAL BIOPSY Right 11/2012   CESAREAN SECTION   04/19/1993   CHOLECYSTECTOMY     COLONOSCOPY  05/02/2011   Procedure: COLONOSCOPY;  Surgeon: Lynwood LITTIE Celestia Mickey., MD;  Location: [facility];  Service: Endoscopy;  Laterality: N/A;   colonoscopy  11/04/2014   DENTAL SURGERY  03/06/2018   2 surgeries ( 03/06/2018 and 04/06/2018)   to remove two separate benign tumors   ESOPHAGEAL MANOMETRY N/A 11/04/2015   Procedure: ESOPHAGEAL MANOMETRY (EM);  Surgeon: Lynwood Celestia, MD;  Location: [facility];  Service: Endoscopy;  Laterality: N/A;   ESOPHAGOGASTRODUODENOSCOPY  05/02/2011   Procedure: ESOPHAGOGASTRODUODENOSCOPY (EGD);  Surgeon: Lynwood LITTIE Celestia Mickey., MD;  Location: [facility];  Service: Endoscopy;  Laterality: N/A;   ESOPHAGOGASTRODUODENOSCOPY N/A 09/01/2014   Procedure: ESOPHAGOGASTRODUODENOSCOPY (EGD);  Surgeon: Lynwood Celestia, MD;  Location: [facility];  Service: Endoscopy;  Laterality: N/A;   IR CV LINE INJECTION  09/13/2023   IR CV LINE INJECTION  12/01/2023   IR IMAGING GUIDED PORT INSERTION  07/06/2023   IR IMAGING GUIDED PORT INSERTION  12/01/2023   IR REMOVAL TUN ACCESS W/ PORT W/O FL MOD SED  12/01/2023   KNEE SURGERY     LAPAROSCOPY     x 4   LESION REMOVAL N/A 01/18/2022   Procedure: EXCISION OF SEBACEOUS CYSTS CHEST AND NECK;  Surgeon: Vanderbilt Ned, MD;  Location: [facility];  Service: General;  Laterality: N/A;   PH IMPEDANCE STUDY N/A 11/04/2015   Procedure: PH IMPEDANCE STUDY;  Surgeon: Lynwood Bohr, MD;  Location: [facility];  Service: Endoscopy;  Laterality: N/A;   VAGINAL HYSTERECTOMY  2010   TVH--ovaries remain   Patient Active Problem List   Diagnosis Date Noted   Sjogren syndrome with keratoconjunctivitis 12/09/2022   Hemochromatosis 06/10/2021   Elevated LFTs 04/04/2021   Colitis 04/04/2021   Bacterial overgrowth syndrome 05/21/2020   Obesity 05/21/2020   History of endometriosis 05/21/2020   Constipation due to outlet dysfunction 02/05/2020   Gastroesophageal reflux disease 02/05/2020    Obstructive sleep apnea treated with continuous positive airway pressure (CPAP) 06/16/2017   Perennial allergic rhinitis with a nonallergic component 01/31/2017   Asthmatic bronchitis 01/31/2017   GI symptoms 01/31/2017   Family history of colon cancer 01/27/2015   Chest pain 12/13/2012   Dyspnea 12/13/2012   DVT of lower extremity (deep venous thrombosis) (HCC) 01/03/1990    PCP: Elliot Charm, MD  REFERRING PROVIDER: Cleotilde Ronal RAMAN, MD   REFERRING DIAG: M62.89 (ICD-10-CM) - Pelvic floor dysfunction  THERAPY DIAG:  Muscle weakness (generalized)  Other low back pain  Pelvic pain  Other muscle spasm  Rationale for Evaluation and Treatment: Rehabilitation  ONSET DATE: chronic  SUBJECTIVE:                                                                                                                                                                                           SUBJECTIVE STATEMENT: Pt reports that the steroid injection in back 12/18; this helped.   Pt reports that using hip hinge for functional tasks has helped.     PAIN:  Are you having pain? Yes NPRS scale: 3/10 Pain location:generalized  Pain type: ache Pain description: intermittent and constant   Aggravating factors: constipation or frequent bowel movements/constantly having bowel movements  Relieving factors: exercises (bowel massage, happy baby, spinal twists, walking)  PRECAUTIONS: None  WEIGHT BEARING RESTRICTIONS: No  FALLS:  Has patient fallen in last 6 months? No  LIVING ENVIRONMENT: Lives with: lives with their spouse Lives in: House/apartment  OCCUPATION: nurse, in admin  PLOF: Independent  PATIENT GOALS: decrease pain and have more normal bowel movements  PERTINENT HISTORY:  Vaginal hysterectomy, DVT LE, endometriosis, interstitial cystitis, exploratory laps for endometriosis, c-section, cholecystectomy, RA diagnosed 06/2023 Sexual abuse: No  BOWEL MOVEMENT: Pain  with bowel movement: Yes Type of bowel movement:Type (Bristol Stool Scale) 1-7 (sometimes full range in the same bowel movement), Frequency sometimes many times a day, sometimes up to 4 days in between, and  Strain Yes Fully empty rectum: No Leakage: No Pads: No Fiber supplement: No - has attempted metamucil and it made constipation worse  URINATION: Pain with urination: Yes Fully empty bladder: No Stream: varies  Urgency: Yes: all the time Frequency: multiple times an hours  Leakage: Urge to void, Walking to the bathroom, Coughing, Sneezing, Laughing, and Exercise Pads: Yes: daily  INTERCOURSE: Pain with intercourse: Initial Penetration and During Penetration Ability to have vaginal penetration:  Yes: with pain Climax: non painful WNL   PREGNANCY: Vaginal deliveries 1 Tearing Yes: 3rd degree tear C-section deliveries 1 Currently pregnant No  PROLAPSE: Periodically will feel vaginal bulging, heaviness in lower abdomen   OBJECTIVE:  10/10/23 Curl-up test: Minimal abdominal distortion focused in upper abdominals, largely improved Transversus abdominus: good core facilitation with exhale Sit-up test: 1/3  LUMBARAROM/PROM:  A/PROM A/PROM  Eval (% available) 11/09/22 (% available) 04/25/23 (% available) 10/10/23 (% available)  Flexion 50 90 90 100  Extension 25, pain anteriorly  25, pain in low back 50 75  Right lateral flexion 50, pain anteriorly  75, pain on right 50, pain on Rt 75  Left lateral flexion 50, pain anteriorly  75, pain on right 75 75   (Blank rows = not tested)    05/04/23:               Internal Pelvic Floor: mild tenderness in bil levator ani, but no increase in tension; with internal rectal exam, she was demonstrating paradoxical contraction when trying to eccentrically lengthen with exhale and had very hard time correcting  Patient confirms identification and approves PT to assess internal pelvic floor and treatment Yes  PELVIC MMT:   MMT eval  Vaginal  2/5, 3 second hold, 3 repeat contractions  Internal Anal Sphincter 1/5  External Anal Sphincter 2/5  Puborectalis 1/5  Diastasis Recti   (Blank rows = not tested)        TONE: WNL   PROLAPSE: Grade 2 anterior vaginal wall laxity  04/25/23: Curl-up test: abdominal distortion Transversus abdominus: week, difficulty getting active contraction (not been working on strengthening)    LUMBARAROM/PROM:  A/PROM A/PROM  Eval (% available) 11/09/22 (% available) 04/25/23 (% available)  Flexion 50 90 90  Extension 25, pain anteriorly  25, pain in low back 50  Right lateral flexion 50, pain anteriorly  75, pain on right 50, pain on Rt  Left lateral flexion 50, pain anteriorly  75, pain on right 75   (Blank rows = not tested)  11/09/22: No internal rectal or vaginal pelvic floor exam performed due to high level of skin irritation   Weak transversus abdominus contraction  Abdominal restriction and tenderness in bil lower quadrant  Unable to perform pelvic tilt with appropriate coordination  LUMBARAROM/PROM:  A/PROM A/PROM  Eval (% available) 11/09/22 (% available)  Flexion 50 90  Extension 25, pain anteriorly  25, pain in low back  Right lateral flexion 50, pain anteriorly  75, pain on right  Left lateral flexion 50, pain anteriorly  75, pain on right   (Blank rows = not tested) 07/21/22: standing prolapse assessment demonstrated grade 2 anterior vaginal wall laxity   06/08/22:               External Perineal Exam: WNL                              Internal Pelvic Floor: burning reported with palpation of superficial muscles;  deep aching/pressure with palpation of Lt levator ani   Patient confirms identification and approves PT to assess internal pelvic floor and treatment Yes  PELVIC MMT:   MMT eval  Vaginal 3/5  Internal Anal Sphincter 1/5  External Anal Sphincter 2/5  Puborectalis 1/5  Diastasis Recti   (Blank rows = not tested)        TONE: High in Lt levator  ani   PROLAPSE: Not able to tell this session due to dyssynergic pelvic floor contraction     06/01/22: COGNITION: Overall cognitive status: Within functional limits for tasks assessed     SENSATION: Light touch: Appears intact Proprioception: Appears intact  GAIT: Comments: decreased hip extension, forward flexed trunk  POSTURE: rounded shoulders, forward head, decreased lumbar lordosis, increased thoracic kyphosis, posterior pelvic tilt, and flexed trunk    LUMBARAROM/PROM:  A/PROM A/PROM  Eval (% available)  Flexion 50  Extension 25, pain anteriorly   Right lateral flexion 50, pain anteriorly   Left lateral flexion 50, pain anteriorly    (Blank rows = not tested)   PALPATION:   General  Significant abdominal restriction and tenderness; decreased rib mobility with mobilization and breathing    TODAY'S TREATMENT:                                                                                                                              DATE:   [facility] Adult PT Treatment:                                                DATE: 01/16/24 Pt seen for aquatic therapy today.  Treatment took place in water 3.5-4.75 ft in depth at the [facility] pool. Temp of water was 91.  Pt entered/exited the pool via stairs independently with bil rail. * STS from bench in water with feet on ground, cues for hip hinge, TrA set, neutral head, and knees straightening at same time as back upright * UE on rainbow hand floats:  walking forward/backward at 49ft depth; side stepping with horiz abdct/add * unsupported walking forward / backward with reciprocal arm swing * UE On wall: heel/toe raises; hip abdct/ add; gentle wall push up/off * TrA set with 1/2 hollow blue noodle pull down to thighs x 8  Pt requires the buoyancy and hydrostatic pressure of water for support, and to offload joints by unweighting joint load by at least 50 % in navel deep water and by at least 75-80% in chest to neck  deep water.  Viscosity of the water is needed for resistance of strengthening. Water current perturbations provides challenge to standing balance requiring increased core activation.   01/10/24:Pt arrives for aquatic physical therapy. Treatment took place in 3.5-5.5 feet of water. Water temperature was 91 degrees F. Pt entered the pool via stairs independently using  rails. Seated water bench with 75% submersion Pt performed seated LE AROM exercises 20x in all planes, with concurrent discussion of current status. -75% depth water walking 4x each holding the Q-tip for back support.VC for proper heel toe walking as pt was ambulating on her toes with forward walking. -decompression hang 3 min with xtra large white noodle -standing post pelvic tilt  against the wall 3 sec hold 6x, Vc to move/tilt from abdomin -Hip abd 10x Bil -Standing marching 10x Bil   01/09/2023 Manual: Supine: ILU bowel mobilization  Abdominal scar tissue mobilization Ileocecal valve mobilization External rectum mobilization Diaphragm release ILU mobilization Prone: Myofascial release to low back Soft tissue mobilization to lumbar paraspinals and glutes 12/25/23 Manual: Supine: ILU bowel mobilization  Abdominal scar tissue mobilization Ileocecal valve mobilization External rectum mobilization Diaphragm release ILU mobilization Prone: Myofascial release to low back Soft tissue mobilization to lumbar paraspinals and glutes  Therapeutic activities: Partner education on bowel mobilization techniques    12/20/23 Manual: Supine: ILU bowel mobilization  Abdominal scar tissue mobilization Ileocecal valve mobilization External rectum mobilization Diaphragm release Therapeutic activities: Squats to table 2 x 10 3 way kick 10x each bil Side stepping + blue loop 3 laps  Heel raises 3 x 10 Staggered deadlift and pt education through bending in this position when performing functional tasks 10x bil  PATIENT  EDUCATION:  Education details: intro to aquatic therapy, issued list of area pools (01/16/24)  Person educated: Patient Education method: Explanation, Demonstration, Tactile cues, Verbal cues Education comprehension: verbalized understanding  HOME EXERCISE PROGRAM: [id number]  ASSESSMENT:  CLINICAL IMPRESSION: Good tolerance to session; overall gentle relief of symptoms.  Discussed possibility of transition to community pool with aquatic HEP, if she continues to have relief of symptoms during sessions.  Copy of local pools issued today.  Will continue to progress as tolerated.  Pt is progressing well towards remaining goals.    OBJECTIVE IMPAIRMENTS: decreased activity tolerance, decreased coordination, decreased endurance, decreased mobility, decreased strength, increased fascial restrictions, increased muscle spasms, impaired tone, postural dysfunction, and pain.   ACTIVITY LIMITATIONS: lifting, bending, continence, and locomotion level  PARTICIPATION LIMITATIONS: interpersonal relationship, community activity, and occupation  PERSONAL FACTORS: 1 comorbidity: medical history are also affecting patient's functional outcome.   REHAB POTENTIAL: Good  CLINICAL DECISION MAKING: Stable/uncomplicated  EVALUATION COMPLEXITY: Low   GOALS: Goals reviewed with patient? Yes  SHORT TERM GOALS: Updated 10/10/23  Pt will be independent with HEP.   Baseline: Goal status: MET 07/12/22  2.  Pt will be independent with diaphragmatic breathing and down training activities in order to improve pelvic floor relaxation.  Baseline:  Goal status: MET 07/12/22  3.  Pt will be independent with the knack, urge suppression technique, and double voiding in order to improve bladder habits and decrease urinary incontinence.   Baseline:  Goal status: MET 09/22/22  4.  Pt will be independent with use of squatty potty, relaxed toileting mechanics, and improved bowel movement techniques in order to increase  ease of bowel movements and complete evacuation.   Baseline:  Goal status: MET 11/09/22  5.  Pt will be able to correctly perform diaphragmatic breathing and appropriate pressure management in order to prevent worsening vaginal wall laxity and improve pelvic floor A/ROM.   Baseline:  Goal status: MET 01/18/23  LONG TERM GOALS: Updated 02/19/24  Pt will be independent with advanced HEP.   Baseline:  Goal status: IN PROGRESS 12/20/23  2.  Pt will demonstrate normal pelvic floor muscle tone  and A/ROM, able to achieve 4/5 strength with contractions and 10 sec endurance, in order to provide appropriate lumbopelvic support in functional activities.   Baseline: not assessed today per patient request - no current bladder issues and they have all resolved Goal status: IN PROGRESS 12/20/23  3.  Pt will increase all impaired lumbar A/ROM by 25% without pain.  Baseline: Pt seen a little bit of loss in some and improvement in extension Goal status:  IN PROGRESS 12/20/23  4.  Pt will report pain no higher than 3/10 with any activity. Baseline: pain currently 3/10, worst is an 8/10 and often corresponds to after she is eating Goal status:  IN PROGRESS 12/20/23  5.  Pt will report 0/10 pain with vaginal penetration in order to improve intimate relationship with partner.    Baseline: no recent intercourse attempts  Goal status:  IN PROGRESS 12/20/23  6.  Pt will be able to go 2-3 hours in between voids without urgency or incontinence in order to improve QOL and perform all functional activities with less difficulty.   Baseline:  Goal status: MET 02/21/23  7.  Pt will report no leaks with laughing, coughing, sneezing in order to improve comfort with interpersonal relationships and community activities.   Baseline: Pt states that she feels 30-40% better Goal status:  MET 10/10/23  8.  Pt will have consistent bowel movements 4-5x/week without straining or pain.  Baseline:  Goal status:  MET  02/21/23  PLAN:  PT FREQUENCY: 1-2x/week  PT DURATION: 6 months  PLANNED INTERVENTIONS: Therapeutic exercises, Therapeutic activity, Neuromuscular re-education, Balance training, Gait training, Patient/Family education, Self Care, Joint mobilization, Dry Needling, Biofeedback, and Manual therapy  PLAN FOR NEXT SESSION: assess response to aquatic therapy  Delon Aquas, PTA 01/16/2024 11:57 AM [facility] [address] [place], [place], [address] Phone: [phone]   Fax:  [phone]   "

## 2024-01-17 ENCOUNTER — Inpatient Hospital Stay

## 2024-01-17 ENCOUNTER — Inpatient Hospital Stay (HOSPITAL_BASED_OUTPATIENT_CLINIC_OR_DEPARTMENT_OTHER): Admitting: Hematology & Oncology

## 2024-01-17 ENCOUNTER — Other Ambulatory Visit: Payer: Self-pay

## 2024-01-17 ENCOUNTER — Encounter: Payer: Self-pay | Admitting: Hematology & Oncology

## 2024-01-17 ENCOUNTER — Ambulatory Visit: Payer: Self-pay | Admitting: Hematology & Oncology

## 2024-01-17 LAB — CBC WITH DIFFERENTIAL (CANCER CENTER ONLY)
Abs Immature Granulocytes: 0 K/uL (ref 0.00–0.07)
Basophils Absolute: 0 K/uL (ref 0.0–0.1)
Basophils Relative: 1 %
Eosinophils Absolute: 0.1 K/uL (ref 0.0–0.5)
Eosinophils Relative: 2 %
HCT: 32.1 % — ABNORMAL LOW (ref 36.0–46.0)
Hemoglobin: 10.1 g/dL — ABNORMAL LOW (ref 12.0–15.0)
Immature Granulocytes: 0 %
Lymphocytes Relative: 39 %
Lymphs Abs: 1.5 K/uL (ref 0.7–4.0)
MCH: 31.3 pg (ref 26.0–34.0)
MCHC: 31.5 g/dL (ref 30.0–36.0)
MCV: 99.4 fL (ref 80.0–100.0)
Monocytes Absolute: 0.4 K/uL (ref 0.1–1.0)
Monocytes Relative: 10 %
Neutro Abs: 1.9 K/uL (ref 1.7–7.7)
Neutrophils Relative %: 48 %
Platelet Count: 250 K/uL (ref 150–400)
RBC: 3.23 MIL/uL — ABNORMAL LOW (ref 3.87–5.11)
RDW: 12.8 % (ref 11.5–15.5)
WBC Count: 4 K/uL (ref 4.0–10.5)
nRBC: 0 % (ref 0.0–0.2)

## 2024-01-17 LAB — CMP (CANCER CENTER ONLY)
ALT: 26 U/L (ref 0–44)
AST: 20 U/L (ref 15–41)
Albumin: 4.3 g/dL (ref 3.5–5.0)
Alkaline Phosphatase: 111 U/L (ref 38–126)
Anion gap: 12 (ref 5–15)
BUN: 9 mg/dL (ref 6–20)
CO2: 23 mmol/L (ref 22–32)
Calcium: 9.1 mg/dL (ref 8.9–10.3)
Chloride: 107 mmol/L (ref 98–111)
Creatinine: 0.75 mg/dL (ref 0.44–1.00)
GFR, Estimated: >60 mL/min
Glucose, Bld: 110 mg/dL — ABNORMAL HIGH (ref 70–99)
Potassium: 4 mmol/L (ref 3.5–5.1)
Sodium: 142 mmol/L (ref 135–145)
Total Bilirubin: 0.5 mg/dL (ref 0.0–1.2)
Total Protein: 6.8 g/dL (ref 6.5–8.1)

## 2024-01-17 LAB — IRON AND IRON BINDING CAPACITY (CC-WL,HP ONLY)
Iron: 49 ug/dL (ref 28–170)
Saturation Ratios: 14 % (ref 10.4–31.8)
TIBC: 344 ug/dL (ref 250–450)
UIBC: 296 ug/dL

## 2024-01-17 LAB — FERRITIN: Ferritin: 25 ng/mL (ref 11–307)

## 2024-01-17 NOTE — Progress Notes (Signed)
 " Hematology and Oncology Follow Up Visit  Karen [name] [id number] 05-11-1970 54 y.o. 01/17/2024   Principle Diagnosis:  Hemochromatosis, heterozygous H63D mutation  Current Therapy:   Observation   Interim History:  Ms. Gemme is here today for follow-up.  She has been quite busy.  She has had issues with her neck.  She has had issues with her lower back.  She has had MRIs.  She has rheumatoid arthritis.  She also has osteoarthritis.  She had facet injections in the back which have helped.  She has had really had no problems with the hemochromatosis.  She has had a lot of problems with the Port-A-Cath.  She had a Port-A-Cath that was replaced and this is doing a whole lot better for her.  When we last saw her, her iron studies showed a ferritin of 89 with an iron saturation of 24%.  She has had no obvious bleeding.  She has little bit more anemic.  I am not sure exactly why.  We will have to watch this.  I know that she does feel tired.  She had a cardiac MRI that was done.  This did not show any iron deposition in the heart.  She had an ejection fraction of 61%.  Her appetite is okay.  She has had no nausea or vomiting.  She has had no mouth sores.  She has occasional headache.  Overall, I will say that her performance status is probably ECOG 1.     Medications:  Allergies as of 01/17/2024       Reactions   Chlorhexidine  Rash   Molds & Smuts Other (See Comments), Cough   sneezing   Cyanoacrylate    Rash /erythema   Methotrexate  Nausea And Vomiting   Tegaderm Ag Mesh [silver]    Rash/erythema   Wound Dressing Adhesive         Medication List        Accurate as of January 17, 2024  1:17 PM. If you have any questions, ask your nurse or doctor.          STOP taking these medications    diazepam  5 MG tablet Commonly known as: Valium  Stopped by: Maude Crease, MD   fluconazole  150 MG tablet Commonly known as: DIFLUCAN  Stopped by: Maude Crease, MD    neomycin  500 MG tablet Commonly known as: MYCIFRADIN  Stopped by: Maude Crease, MD       TAKE these medications    Acetaminophen  500 MG capsule 1 capsule as needed Orally every 6 hrs What changed: See the new instructions.   albuterol  108 (90 Base) MCG/ACT inhaler Commonly known as: VENTOLIN  HFA Inhale 2 puffs into the lungs every 6 (six) hours as needed for wheezing or shortness of breath.   clindamycin  1 % gel Commonly known as: CLINDAGEL  Apply to the pustules / pus bumps around the thigh twice daily as needed   clobetasol  ointment 0.05 % Commonly known as: TEMOVATE  Apply to the red, itchy patches on the skin twice daily as needed. Do not apply to face.   Elderberry 575 MG/5ML Syrp Take 30 mLs by mouth daily.   fluticasone  50 MCG/ACT nasal spray Commonly known as: FLONASE  Place 2 sprays into both nostrils daily.   Humira  (2 Pen) 40 MG/0.4ML pen Generic drug: adalimumab  Inject 40 mg subcutaneously every 2 weeks as directed What changed: Another medication with the same name was removed. Continue taking this medication, and follow the directions you see here. Changed by: Maude Crease,  MD   lansoprazole  30 MG capsule Commonly known as: PREVACID  Take 1 capsule (30 mg total) by mouth daily.   levocetirizine 5 MG tablet Commonly known as: XYZAL  Take 1 tablet (5 mg total) by mouth every evening.   lidocaine -prilocaine  cream Commonly known as: EMLA  Apply 1 Application topically as needed.   methocarbamol  500 MG tablet Commonly known as: ROBAXIN  Take 1 tablet (500 mg total) by mouth 3 (three) times daily. What changed:  when to take this reasons to take this   ondansetron  4 MG disintegrating tablet Commonly known as: ZOFRAN -ODT Dissolve 1 tablet (4 mg total) in mouth every 8 (eight) hours as needed for nausea   polyethylene glycol 17 g packet Commonly known as: MIRALAX  / GLYCOLAX  Take 17 g by mouth every other day. What changed:  when to take this reasons  to take this   Restora RX 60-1.25 MG Caps Generic drug: Lactobacillus Casei-Folic Acid  Take 1 capsule by mouth every morning.   rizatriptan  5 MG tablet Commonly known as: MAXALT  Take 1 tablet (5 mg total) by mouth as needed for migraine. May repeat in 2 hours if needed, no more than 2 pills/24 hours, no more than 3 pills/week.   triamcinolone  ointment 0.1 % Commonly known as: KENALOG  Apply topically 2 (two) times daily. Apply 2 times daily for 2 weeks then STOP for 2 weeks, resume if areas are still itchy 2 weeks on and 2 weeks off   Xifaxan  550 MG Tabs tablet Generic drug: rifaximin  Take 1 tablet (550 mg total) by mouth 3 (three) times daily.   Xiidra  5 % Soln Generic drug: Lifitegrast  Place 1 drop into both eyes 2 (two) times daily.        Allergies:  Allergies  Allergen Reactions   Chlorhexidine  Rash   Molds & Smuts Other (See Comments) and Cough    sneezing   Cyanoacrylate     Rash /erythema   Methotrexate  Nausea And Vomiting   Tegaderm Ag Mesh [Silver]     Rash/erythema   Wound Dressing Adhesive     Past Medical History, Surgical history, Social history, and Family History were reviewed and updated.  Review of Systems: Review of Systems  Constitutional:  Positive for malaise/fatigue.  HENT: Negative.    Eyes: Negative.   Respiratory: Negative.    Cardiovascular: Negative.   Gastrointestinal:  Positive for abdominal pain, heartburn and nausea.  Genitourinary: Negative.   Musculoskeletal: Negative.   Skin: Negative.   Neurological: Negative.   Endo/Heme/Allergies: Negative.   Psychiatric/Behavioral: Negative.       Physical Exam:  height is 5' 7.5 (1.715 m) and weight is 221 lb (100.2 kg). Her oral temperature is 98.5 F (36.9 C). Her blood pressure is 116/80 and her pulse is 78. Her respiration is 20 and oxygen saturation is 100%.   Wt Readings from Last 3 Encounters:  01/17/24 221 lb (100.2 kg)  01/15/24 220 lb 12.8 oz (100.2 kg)  12/22/23 218  lb 8 oz (99.1 kg)    Physical Exam Vitals reviewed.  HENT:     Head: Normocephalic and atraumatic.  Eyes:     Pupils: Pupils are equal, round, and reactive to light.  Cardiovascular:     Rate and Rhythm: Normal rate and regular rhythm.     Heart sounds: Normal heart sounds.  Pulmonary:     Effort: Pulmonary effort is normal.     Breath sounds: Normal breath sounds.  Abdominal:     General: Bowel sounds are normal.  Palpations: Abdomen is soft.     Comments: Abdominal exam is soft.  She has some tenderness throughout the abdomen to palpation.  She has decreased bowel sounds.  She has no obvious fluid wave.  There is no palpable liver or spleen tip.  Musculoskeletal:        General: No tenderness or deformity. Normal range of motion.     Cervical back: Normal range of motion.  Lymphadenopathy:     Cervical: No cervical adenopathy.  Skin:    General: Skin is warm and dry.     Findings: No erythema or rash.  Neurological:     Mental Status: She is alert and oriented to person, place, and time.  Psychiatric:        Behavior: Behavior normal.        Thought Content: Thought content normal.        Judgment: Judgment normal.      Lab Results  Component Value Date   WBC 4.0 01/17/2024   HGB 10.1 (L) 01/17/2024   HCT 32.1 (L) 01/17/2024   MCV 99.4 01/17/2024   PLT 250 01/17/2024   Lab Results  Component Value Date   FERRITIN 89 12/08/2023   IRON 88 12/08/2023   TIBC 365 12/08/2023   UIBC 277 12/08/2023   IRONPCTSAT 24 12/08/2023   Lab Results  Component Value Date   RETICCTPCT 2.1 09/01/2023   RBC 3.23 (L) 01/17/2024   No results found for: JONATHAN BONG [facility] Lab Results  Component Value Date   IGGSERUM 1,438 12/08/2022   IGMSERUM 167 12/08/2022   No results found for: STEPHANY CARLOTA BENSON MARKEL EARLA JOANNIE DOC VICK, SPEI   Chemistry      Component Value Date/Time   NA 139 12/08/2023 0941   NA  140 03/21/2019 1158   K 4.0 12/08/2023 0941   CL 102 12/08/2023 0941   CO2 28 12/08/2023 0941   BUN 12 12/08/2023 0941   BUN 9 03/21/2019 1158   CREATININE 0.74 12/08/2023 0941      Component Value Date/Time   CALCIUM 9.7 12/08/2023 0941   ALKPHOS 132 (H) 12/08/2023 0941   AST 26 12/08/2023 0941   ALT 34 12/08/2023 0941   BILITOT 0.6 12/08/2023 0941       Impression and Plan: Ms. Wayment is very pleasant 54 yo African American female with hemochromatosis, heterozygous for the H63D mutation.   I think, clinically, her biggest issues are the osteoarthritis and rheumatoid arthritis.  Again, I do not see anything that would implicate hemochromatosis.  I am grateful that the cardiac MRI did not show any cardiac implications from hemochromatosis.  Again, we will have to watch the anemia.  I would have to get her back in probably in a month.  It is very delightful talking with Ms. Cayton.  Her daughter will graduate with a PhD from [employer] this year.   Maude JONELLE Crease, MD 1/14/20261:17 PM "

## 2024-01-17 NOTE — Patient Instructions (Signed)

## 2024-01-18 ENCOUNTER — Other Ambulatory Visit: Payer: Self-pay

## 2024-01-22 ENCOUNTER — Ambulatory Visit: Admitting: Occupational Therapy

## 2024-01-22 ENCOUNTER — Encounter: Payer: Self-pay | Admitting: Occupational Therapy

## 2024-01-22 ENCOUNTER — Other Ambulatory Visit: Payer: Self-pay

## 2024-01-22 DIAGNOSIS — I890 Lymphedema, not elsewhere classified: Secondary | ICD-10-CM

## 2024-01-22 NOTE — Therapy (Signed)
 " OUTPATIENT OCCUPATIONAL THERAPY TREATMENT NOTE  BLE/ BLQ LYMPHEDEMA  Patient Name: Karen [name] MRN: [id number] DOB:1970/07/01, 54 y.o., female Today's Date: 01/22/2024  REPORTING PERIOD: 10/30/23 -   END OF SESSION:  OT End of Session - 01/22/24 1004     Visit Number 57    Number of Visits 72    Date for Recertification  03/17/24    OT Start Time 1000    OT Stop Time 1100    OT Time Calculation (min) 60 min    Activity Tolerance Patient tolerated treatment well;No increased pain    Behavior During Therapy [facility] for tasks assessed/performed           Past Medical History:  Diagnosis Date   Adenomyosis    Anemia    Arthritis 2013   L knee gets steroid injections    Arthritis, rheumatic, acute or subacute    Asthmatic bronchitis 01/31/2017   Back pain    Chronic constipation    Diarrhea    DVT of lower extremity (deep venous thrombosis) (HCC)    Dysmenorrhea    Endometriosis    Esophageal stricture    G6PD deficiency    GERD (gastroesophageal reflux disease)    History of kidney stones    History of uterine fibroid    IBS (irritable bowel syndrome)    Interstitial cystitis    Lactose intolerance    Migraine    with aura   Other hemochromatosis 06/10/2021   PONV (postoperative nausea and vomiting)    Recurrent upper respiratory infection (URI)    Sebaceous cyst of breast, right lower inner quadrant 10/25/2012   Excised 11/21/12    Sleep apnea    Syncope, non cardiac    Past Surgical History:  Procedure Laterality Date   ARTHROSCOPIC HAGLUNDS REPAIR     BREAST CYST EXCISION Right 11/21/2012   Procedure: CYST EXCISION BREAST;  Surgeon: Sherlean JINNY Laughter, MD;  Location: [facility];  Service: General;  Laterality: Right;   BREAST CYST EXCISION Bilateral 01/18/2022   Procedure: EXCISION OF SEBACEOUS CYST BILATERAL BREAST;  Surgeon: Vanderbilt Ned, MD;  Location: [facility];  Service: General;  Laterality: Bilateral;   BREAST  EXCISIONAL BIOPSY Right 11/2012   CESAREAN SECTION  04/19/1993   CHOLECYSTECTOMY     COLONOSCOPY  05/02/2011   Procedure: COLONOSCOPY;  Surgeon: Lynwood LITTIE Celestia Mickey., MD;  Location: [facility];  Service: Endoscopy;  Laterality: N/A;   colonoscopy  11/04/2014   DENTAL SURGERY  03/06/2018   2 surgeries ( 03/06/2018 and 04/06/2018)   to remove two separate benign tumors   ESOPHAGEAL MANOMETRY N/A 11/04/2015   Procedure: ESOPHAGEAL MANOMETRY (EM);  Surgeon: Lynwood Celestia, MD;  Location: [facility];  Service: Endoscopy;  Laterality: N/A;   ESOPHAGOGASTRODUODENOSCOPY  05/02/2011   Procedure: ESOPHAGOGASTRODUODENOSCOPY (EGD);  Surgeon: Lynwood LITTIE Celestia Mickey., MD;  Location: [facility];  Service: Endoscopy;  Laterality: N/A;   ESOPHAGOGASTRODUODENOSCOPY N/A 09/01/2014   Procedure: ESOPHAGOGASTRODUODENOSCOPY (EGD);  Surgeon: Lynwood Celestia, MD;  Location: [facility];  Service: Endoscopy;  Laterality: N/A;   IR CV LINE INJECTION  09/13/2023   IR CV LINE INJECTION  12/01/2023   IR IMAGING GUIDED PORT INSERTION  07/06/2023   IR IMAGING GUIDED PORT INSERTION  12/01/2023   IR REMOVAL TUN ACCESS W/ PORT W/O FL MOD SED  12/01/2023   KNEE SURGERY     LAPAROSCOPY     x 4   LESION REMOVAL N/A 01/18/2022   Procedure: EXCISION OF  SEBACEOUS CYSTS CHEST AND NECK;  Surgeon: Vanderbilt Ned, MD;  Location: [facility];  Service: General;  Laterality: N/A;   PH IMPEDANCE STUDY N/A 11/04/2015   Procedure: PH IMPEDANCE STUDY;  Surgeon: Lynwood Bohr, MD;  Location: [facility];  Service: Endoscopy;  Laterality: N/A;   VAGINAL HYSTERECTOMY  2010   TVH--ovaries remain   Patient Active Problem List   Diagnosis Date Noted   Sjogren syndrome with keratoconjunctivitis 12/09/2022   Hemochromatosis 06/10/2021   Elevated LFTs 04/04/2021   Colitis 04/04/2021   Bacterial overgrowth syndrome 05/21/2020   Obesity 05/21/2020   History of endometriosis 05/21/2020   Constipation due to outlet dysfunction  02/05/2020   Gastroesophageal reflux disease 02/05/2020   Obstructive sleep apnea treated with continuous positive airway pressure (CPAP) 06/16/2017   Perennial allergic rhinitis with a nonallergic component 01/31/2017   Asthmatic bronchitis 01/31/2017   GI symptoms 01/31/2017   Family history of colon cancer 01/27/2015   Chest pain 12/13/2012   Dyspnea 12/13/2012   DVT of lower extremity (deep venous thrombosis) (HCC) 01/03/1990   PCP: Gaile New, MD  REFERRING PROVIDER: Valery Ripple, MD  REFERRING DIAG: I89.0  THERAPY DIAG:  Lymphedema, not elsewhere classified  Rationale for Evaluation and Treatment: Rehabilitation  ONSET DATE: ~2014; exacerbation in 2023  SUBJECTIVE:                                                                                                                                                                                          SUBJECTIVE STATEMENT:  Pt was last seen on 01/08/24 when Pt's daughter attended clinic with her for 2nd time to learn and practice abdominal MLD. Pt states she has been doing pretty well during the visit interval. Pt states she started aquatic PT and has been to 3 sessions to date.Pt denies LE related pain this morning. She states she feels fulness in her belly.  PERTINENT HISTORY: CVI, OSA (no CPAP), GERD, Esophageal stricture, IBS, Lactose intolerant, Diarrhea, Hx LE DVT, recent RA dx, OA  PAIN:  Are you having LE-related pain? No Pain location: abdomen; digestive discomfort; B leg fullness Pain description: abdominal fullness, tightness Aggravating factors: standing, walking,  Relieving factors: MLD, Miralax ,   PRECAUTIONS: Other: LYMPHEDEMA; Hx LLE DVT  WEIGHT BEARING RESTRICTIONS: No  FALLS:  Has patient fallen in last 6 months? No  LIVING ENVIRONMENT: Lives with: lives with spouse and daughter Lives in: House/apartment Stairs: Yes; Internal: 14 steps; yes and External: 4 steps; yes Has following  equipment at home: None  OCCUPATION: [employer] Services Operational Officer, walking, desk work  LEISURE: family time  HAND DOMINANCE: right   PRIOR LEVEL  OF FUNCTION: Independent  PATIENT GOALS: Feel better, be able to be more active without pain, wear preferred street shoes. NEW 08/03/23: Be able to manage digestive discomfort and gut inflammation symptoms without medications  OBJECTIVE: Note: Objective measures were completed at Evaluation unless otherwise noted.  COGNITION:  Overall cognitive status: Within functional limits for tasks assessed   POSTURE: WFL  LE ROM: WFL  LE MMT: WFL  LYMPHEDEMA ASSESSMENTS: non-Ca related  INFECTIONS: denies cellulitis and wound hx  BLE COMPARATIVE LIMB VOLUMETRICS Initial 11/15/22  LANDMARK RIGHT (dominant)  R LEG (A-D) 3028.9 ml  R THIGH (E-G) 4809.60 ml  R FULL LIMB (A-G) 7838.5 ml  Limb Volume differential (LVD)  %  Volume change since initial %  Volume change overall V  (Blank rows = not tested)  LANDMARK LEFT  (Rx)  L LEG (A-D) 3189.9 ml  L THIGH (E-G) 4959.8 ml  L FULL LIMB (A-G) 8149.7 ml  Limb Volume differential (LVD)  LEG LVD = 5.32%, L>R; THIGH LVD = 3.12%, L>R; And full LLE LVD = 3.97%, L>R.   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  Skin  Description Hyper-Keratosis Peau d' Orange Shiny Tight Fibrotic/ Indurated Fatty Doughy Spongy/ boggy       R>L x  x   Skin dry Flaky WNL Macerated   mildly      Color Redness Varicosities Blanching Hemosiderin Stain Mottled        x   Odor Malodorous Yeast Fungal infection  WNL      x    Temperature Warm Cool wnl     x    Pitting Edema   1+ 2+ 3+ 4+ Non-pitting         x   Girth Symmetrical Asymmetrical                   Distribution    L>R toes to groin    Stemmer Sign Positive Negative   +L -R   Lymphorrhea History Of:  Present Absent     x    Wounds History Of Present Absent Venous Arterial Pressure Sheer     x        Signs of Infection Redness Warmth Erythema Acute Swelling Drainage Borders                    Sensation Light Touch Deep pressure Hypersensitivity   In tact Impaired In tact Impaired Absent Impaired   x  x  x     Nails WNL   Fungus nail dystrophy   x     Hair Growth Symmetrical Asymmetrical   x    Skin Creases Base of toes  Ankles   Base of Fingers knees       Abdominal pannus Thigh Lobules  Face/neck   x           GAIT: Distance walked: >500' Assistive device utilized: None Level of assistance: Complete Independence Comments: Functional ambulation and transfers [facility]  LYMPHEDEMA LIFE IMPACT SCALE (LLIS): Initial 11/15/22  50%  FOTO functional outcome measure: Deferred. FOTO discontinued.  TODAY'S TREATMENT:                                                                                                                                         MLD to bilateral deep abdominal lymphatics following ascending, transverse and descending colon with diaphragmatic breathing, as established Pt edu for LE self care  PATIENT EDUCATION:  Continued Pt/ CG edu for how function of cisterna chyli, part of the thoracic duct of deep abdominal lymphatics, assists with digestion by filtering many of the fats and proteins from the digestive system. Sub optimal lymphatic flow in this region may result in constipation, bloating, swelling, etc. MLD helps mobilize these protein and fats , and the rhythmic movement of manual lymphatic drainage stimulates digestive muscles and increase peristalsis. Provided printed resource for reference.   Topics include outcome of comparative limb volumetrics- starting limb volume differentials (LVDs), technology and gradient techniques used for short stretch, multilayer compression wrapping, simple self-MLD, therapeutic lymphatic pumping exercises, skin/nail care, LE precautions,. compression garment recommendations and specifications, wear and care schedule and compression garment donning / doffing w assistive devices. Discussed progress towards all OT goals since commencing CDT. All questions answered to the Pt's satisfaction. Good return. Person educated: Patient  Education method: Explanation, Demonstration, and Handouts Education comprehension: verbalized understanding, returned demonstration, verbal cues required, and needs further education  [facility] PRECAUTIONS: Pt education re precautions related to use of advanced sequential pneumatic compression device. Pt verbalized understanding that she should never use device on 2 arms/legs simultaneously and should not use device 2 x on same day to avoid overloading her heart with fluid volume return both at present, and in years to come should her medical condition change. Pt instructed to remove device immediately should she experience atypical SOP, light headedness, or acute pain. Pt instructed to discontinue pump if she suspects, or has any infection, blood clot, cellulitis, the flu, corona virus, etc.   HOME EXERCISE PROGRAM: BLE lymphatic pumping there ex using- 1 sets of 10 reps, each exercise in order-  1-2 x daily, bilaterally Simple self MLD 1 x daily Daily skin care to increase hydration, skin mobility and decrease infection risk- can be done during MLD Compression wraps 23/7 during intensive phase of CDT Compression garments/ devices during self-management phase of CDT  ASSESSMENT:  CLINICAL IMPRESSION:No obvious increase in BLE or abdominal swelling observed today.  Pt tolerated the abdominal sequence as established without increased  pain. Daughter is able to perform deep abdominal MLD between sessions, but has not been available lately. Pt in agreement with plan to reduce OT frequency for lymphedema care to every other week for a couple of visits, then further decrease frequency to 1 x monthly, then PRN status as she has reached a clinical plateau in progress.   (10/30/23 OT  Progress Note: Pls review GOALS section below for additional details regarding progress towards goals. Pt understands and can apply lymphedema precautions after skilled training. Pt remains 100% compliant with lymphedema self care. Because lymphatic massage to one's own deep abdomen is challenging, especially with orthopedic issues Pt has had this year, Pt's daughter is learning to assist her mom with simple self MLD. Pt has also undergone a trial of the advanced Flexitouch PLUS sequential pneumatic compression device which features an abdominal, truncal, buttocks component above the level of the inguinal LN to facilitate fluid congestion towards the thoracic duct and terminus. Pt felt this helpful to her during the trial and she's hoping for insurance approval. Pt has obtained the recommended knee length compression garment and wears it for prolonged walking and dependent positioning. Limb volume measurement in the lower extremity for comparison was not performed today due to time constraints, but by visual assessment it appears unchanged. Abdominal swelling and tightness is also unchanged by visual assessment and palpation. Pt has reached a clinical plateau and he agrees with plan to taper treatment to transition to LE self management phase of CDT. After her next session we'll reduce OT for every other week for a couple of weeks, then further reduce PRN.   (11/15/22 Initial Evaluation: Raenette Sakata is a 54 y.o. female presenting with very mild, stage II, LLE lymphedema 2/2 venous insufficiency and obesity. L Leg swelling and associated pain swelling fluctuates. It  has progressed over time, and now no longer resolves with elevation over night. RLE swelling limits balance during functional ambulation. It exacerbates infection and falls risk. LLE/RLQ lymphedema interferes with functional performance in all occupational domains, including basic and instrumental ADLs, productive activities, leisure pursuits and social participation. Pt will benefit from Occupational Therapy for Complete Decongestive Therapy (CDT) to restore function, to reduce physical and psychologic suffering associated with chronic, progressive lymphedema and associated pain, and to limit infection. CDT will include manual lymphatic drainage (MLD), skin care, therapeutic exercise and compression wraps and garment's devices. Without skilled OT for lymphedema care the condition will worsen and further functional decline is expected  Custom-made gradient compression garments and HOS devices are medically necessary because they are uniquely sized and shaped to fit the exact dimensions of the affected extremities, and to provide appropriate medical grade, graduated compression essential for optimally managing chronic, progressive lymphedema. Multiple custom compression garments are needed to ensure proper hygiene to limit infection risk. Custom compression garments should be replaced q 3-6 months When worn consistently for optimal lipo-lymphedema self-management over time. HOS devices, medically necessary to limit fibrosis buildup in tissue, should be replaced q 2 years and PRN when worn out.      OBJECTIVE IMPAIRMENTS: decreased activity tolerance, decreased knowledge of condition, decreased knowledge of use of DME, increased edema, impaired sensation, pain, and chronic, progressive leg swelling.   ACTIVITY LIMITATIONS: dependent sitting, extended standing and/ or walking, squatting, and lower body dressing, fitting preferred street shoes  PARTICIPATION LIMITATIONS: shopping, community activity,  occupation, and yard work  PERSONAL FACTORS: 3+ comorbidities: Hx DVT,  OSA, CVI. Varicose veins are also affecting patient's functional outcome.   REHAB POTENTIAL: Good  EVALUATION COMPLEXITY: Moderate   GOALS: Goals reviewed with patient? Yes  SHORT TERM GOALS: Target date: 4th OT Rx visit  SHORT TERM GOALS: Target date: 4th OT Rx visit   Pt will demonstrate understanding of lymphedema precautions and prevention strategies with modified independence using a printed reference to identify at least 5 precautions and discussing  how s/he may implement them into daily life to reduce risk of progression with extra time. Baseline:Max A Goal status: GOAL MET   Pt will be able to apply multilayer, knee length, gradient, compression wraps to one leg at a time with modified assistance (extra time and assistive device/s) to decrease limb volume, to limit infection risk, and to limit lymphedema progression.  Baseline: Dependent Goal status: GOAL DISCHARGED. Pt Going to compression garments instead  LONG TERM GOALS: Target date: 02/08/23  Given this patient's Intake score of 67%/100% on the functional outcomes FOTO tool, patient will experience an increase in function of 5 points to improve basic and instrumental ADLs performance, including lymphedema self-care.  Baseline: Max A Goal status:GOAL DISCHARGED.  FOTO TOOL DISCONTINUED AT CLINIC   Given this patient's Intake score of 50% on the Lymphedema Life Impact Scale (LLIS), patient will experience a reduction of at least 5 points in her perceived level of functional impairment resulting from lymphedema to improve functional performance and quality of life (QOL). Baseline: 50% Goal status: PROGRESSING  Pt will achieve at least a 10% volume reduction in B legs to return limb to typical size and shape, to limit infection risk and LE progression, to decrease pain, to improve function. Baseline: Dependent Goal status: PROGRESSING  4.  Pt will  obtain proper compression garments/devices and achieve modified independence (extra time + assistive devices) with donning/doffing to optimize limb volume reductions and limit LE  progression over time. Baseline:  Goal status:GOAL MET  5.  During Intensive phase CDT , with modified independence, Pt will achieve at least 85% compliance with all lymphedema self-care home program components, including daily skin care, compression wraps and /or garments, simple self MLD and lymphatic pumping therex to habituate LE self care protocol  into ADLs for optimal LE self-management over time. Baseline: Dependent Goal status:GOAL MET   PLAN:  OT FREQUENCY: every other week for 1 month, then PRN  OT DURATION: 12 weeks and PRN  PLANNED INTERVENTIONS: 97110-Therapeutic exercises, 97530- Therapeutic activity, 97535- Self Care, [id number]- Manual therapy, Patient/Family education, Manual lymph drainage, Compression bandaging, and fit with appropriate compression garments  PLAN FOR NEXT SESSION:  MLD as established Pt and family edu for LE self-care  Zebedee Dec, MS, OTR/L, CLT-LANA 01/22/24 1:34 PM  "

## 2024-01-24 ENCOUNTER — Other Ambulatory Visit (HOSPITAL_COMMUNITY): Payer: Self-pay

## 2024-01-24 ENCOUNTER — Ambulatory Visit: Admitting: Physical Therapy

## 2024-01-24 ENCOUNTER — Encounter: Payer: Self-pay | Admitting: Physical Therapy

## 2024-01-24 ENCOUNTER — Encounter: Payer: Self-pay | Admitting: Hematology & Oncology

## 2024-01-24 ENCOUNTER — Other Ambulatory Visit: Payer: Self-pay

## 2024-01-24 DIAGNOSIS — R279 Unspecified lack of coordination: Secondary | ICD-10-CM

## 2024-01-24 DIAGNOSIS — I890 Lymphedema, not elsewhere classified: Secondary | ICD-10-CM

## 2024-01-24 DIAGNOSIS — M6281 Muscle weakness (generalized): Secondary | ICD-10-CM

## 2024-01-24 DIAGNOSIS — R102 Pelvic and perineal pain unspecified side: Secondary | ICD-10-CM

## 2024-01-24 DIAGNOSIS — M5459 Other low back pain: Secondary | ICD-10-CM

## 2024-01-24 DIAGNOSIS — M62838 Other muscle spasm: Secondary | ICD-10-CM

## 2024-01-24 NOTE — Progress Notes (Signed)
 Specialty Pharmacy Refill Coordination Note  Karen [name] is a 54 y.o. female contacted today regarding refills of specialty medication(s) Adalimumab  (Humira  (2 Pen))   Patient requested Pickup at [facility] at [place] date: 01/25/24   Medication will be filled on: 01/25/24  Patient provided manufacture copay card - Copay $0

## 2024-01-24 NOTE — Therapy (Signed)
 " OUTPATIENT PHYSICAL THERAPY FEMALE PELVIC TREATMENT  Patient Name: Karen [name] MRN: [id number] DOB:1970/08/12, 54 y.o., female Today's Date: 01/24/2024  END OF SESSION:  PT End of Session - 01/24/24 1021     Visit Number 27    Date for Recertification  02/19/24    Authorization Type [employer]    Authorization Time Period signed dry needling consent form 08/08/23    PT Start Time 1020    PT Stop Time 1100    PT Time Calculation (min) 40 min    Activity Tolerance Patient tolerated treatment well    Behavior During Therapy [facility] for tasks assessed/performed           Past Medical History:  Diagnosis Date   Adenomyosis    Anemia    Arthritis 2013   L knee gets steroid injections    Arthritis, rheumatic, acute or subacute    Asthmatic bronchitis 01/31/2017   Back pain    Chronic constipation    Diarrhea    DVT of lower extremity (deep venous thrombosis) (HCC)    Dysmenorrhea    Endometriosis    Esophageal stricture    G6PD deficiency    GERD (gastroesophageal reflux disease)    History of kidney stones    History of uterine fibroid    IBS (irritable bowel syndrome)    Interstitial cystitis    Lactose intolerance    Migraine    with aura   Other hemochromatosis 06/10/2021   PONV (postoperative nausea and vomiting)    Recurrent upper respiratory infection (URI)    Sebaceous cyst of breast, right lower inner quadrant 10/25/2012   Excised 11/21/12    Sleep apnea    Syncope, non cardiac    Past Surgical History:  Procedure Laterality Date   ARTHROSCOPIC HAGLUNDS REPAIR     BREAST CYST EXCISION Right 11/21/2012   Procedure: CYST EXCISION BREAST;  Surgeon: Sherlean JINNY Laughter, MD;  Location: [facility];  Service: General;  Laterality: Right;   BREAST CYST EXCISION Bilateral 01/18/2022   Procedure: EXCISION OF SEBACEOUS CYST BILATERAL BREAST;  Surgeon: Vanderbilt Ned, MD;  Location: [facility];  Service: General;  Laterality: Bilateral;    BREAST EXCISIONAL BIOPSY Right 11/2012   CESAREAN SECTION  04/19/1993   CHOLECYSTECTOMY     COLONOSCOPY  05/02/2011   Procedure: COLONOSCOPY;  Surgeon: Lynwood LITTIE Celestia Mickey., MD;  Location: [facility];  Service: Endoscopy;  Laterality: N/A;   colonoscopy  11/04/2014   DENTAL SURGERY  03/06/2018   2 surgeries ( 03/06/2018 and 04/06/2018)   to remove two separate benign tumors   ESOPHAGEAL MANOMETRY N/A 11/04/2015   Procedure: ESOPHAGEAL MANOMETRY (EM);  Surgeon: Lynwood Celestia, MD;  Location: [facility];  Service: Endoscopy;  Laterality: N/A;   ESOPHAGOGASTRODUODENOSCOPY  05/02/2011   Procedure: ESOPHAGOGASTRODUODENOSCOPY (EGD);  Surgeon: Lynwood LITTIE Celestia Mickey., MD;  Location: [facility];  Service: Endoscopy;  Laterality: N/A;   ESOPHAGOGASTRODUODENOSCOPY N/A 09/01/2014   Procedure: ESOPHAGOGASTRODUODENOSCOPY (EGD);  Surgeon: Lynwood Celestia, MD;  Location: [facility];  Service: Endoscopy;  Laterality: N/A;   IR CV LINE INJECTION  09/13/2023   IR CV LINE INJECTION  12/01/2023   IR IMAGING GUIDED PORT INSERTION  07/06/2023   IR IMAGING GUIDED PORT INSERTION  12/01/2023   IR REMOVAL TUN ACCESS W/ PORT W/O FL MOD SED  12/01/2023   KNEE SURGERY     LAPAROSCOPY     x 4   LESION REMOVAL N/A 01/18/2022   Procedure: EXCISION OF  SEBACEOUS CYSTS CHEST AND NECK;  Surgeon: Vanderbilt Ned, MD;  Location: [facility];  Service: General;  Laterality: N/A;   PH IMPEDANCE STUDY N/A 11/04/2015   Procedure: PH IMPEDANCE STUDY;  Surgeon: Lynwood Bohr, MD;  Location: [facility];  Service: Endoscopy;  Laterality: N/A;   VAGINAL HYSTERECTOMY  2010   TVH--ovaries remain   Patient Active Problem List   Diagnosis Date Noted   Sjogren syndrome with keratoconjunctivitis 12/09/2022   Hemochromatosis 06/10/2021   Elevated LFTs 04/04/2021   Colitis 04/04/2021   Bacterial overgrowth syndrome 05/21/2020   Obesity 05/21/2020   History of endometriosis 05/21/2020   Constipation due to outlet dysfunction  02/05/2020   Gastroesophageal reflux disease 02/05/2020   Obstructive sleep apnea treated with continuous positive airway pressure (CPAP) 06/16/2017   Perennial allergic rhinitis with a nonallergic component 01/31/2017   Asthmatic bronchitis 01/31/2017   GI symptoms 01/31/2017   Family history of colon cancer 01/27/2015   Chest pain 12/13/2012   Dyspnea 12/13/2012   DVT of lower extremity (deep venous thrombosis) (HCC) 01/03/1990    PCP: Elliot Charm, MD  REFERRING PROVIDER: Cleotilde Ronal RAMAN, MD   REFERRING DIAG: M62.89 (ICD-10-CM) - Pelvic floor dysfunction  THERAPY DIAG:  Lymphedema, not elsewhere classified  Muscle weakness (generalized)  Other low back pain  Pelvic pain  Other muscle spasm  Unspecified lack of coordination  Rationale for Evaluation and Treatment: Rehabilitation  ONSET DATE: chronic  SUBJECTIVE:                                                                                                                                                                                           SUBJECTIVE STATEMENT: Felt good after last pool session. Just tired today.   PAIN:  Are you having pain? No NPRS scale: /10 Pain location Pain type: ache Pain description: intermittent and constant   Aggravating factors: constipation or frequent bowel movements/constantly having bowel movements  Relieving factors: exercises (bowel massage, happy baby, spinal twists, walking)  PRECAUTIONS: None  WEIGHT BEARING RESTRICTIONS: No  FALLS:  Has patient fallen in last 6 months? No  LIVING ENVIRONMENT: Lives with: lives with their spouse Lives in: House/apartment  OCCUPATION: nurse, in admin  PLOF: Independent  PATIENT GOALS: decrease pain and have more normal bowel movements  PERTINENT HISTORY:  Vaginal hysterectomy, DVT LE, endometriosis, interstitial cystitis, exploratory laps for endometriosis, c-section, cholecystectomy, RA diagnosed 06/2023 Sexual  abuse: No  BOWEL MOVEMENT: Pain with bowel movement: Yes Type of bowel movement:Type (Bristol Stool Scale) 1-7 (sometimes full range in the same bowel movement), Frequency sometimes many times a day, sometimes up to 4 days  in between, and Strain Yes Fully empty rectum: No Leakage: No Pads: No Fiber supplement: No - has attempted metamucil and it made constipation worse  URINATION: Pain with urination: Yes Fully empty bladder: No Stream: varies  Urgency: Yes: all the time Frequency: multiple times an hours  Leakage: Urge to void, Walking to the bathroom, Coughing, Sneezing, Laughing, and Exercise Pads: Yes: daily  INTERCOURSE: Pain with intercourse: Initial Penetration and During Penetration Ability to have vaginal penetration:  Yes: with pain Climax: non painful WNL   PREGNANCY: Vaginal deliveries 1 Tearing Yes: 3rd degree tear C-section deliveries 1 Currently pregnant No  PROLAPSE: Periodically will feel vaginal bulging, heaviness in lower abdomen   OBJECTIVE:  10/10/23 Curl-up test: Minimal abdominal distortion focused in upper abdominals, largely improved Transversus abdominus: good core facilitation with exhale Sit-up test: 1/3  LUMBARAROM/PROM:  A/PROM A/PROM  Eval (% available) 11/09/22 (% available) 04/25/23 (% available) 10/10/23 (% available)  Flexion 50 90 90 100  Extension 25, pain anteriorly  25, pain in low back 50 75  Right lateral flexion 50, pain anteriorly  75, pain on right 50, pain on Rt 75  Left lateral flexion 50, pain anteriorly  75, pain on right 75 75   (Blank rows = not tested)    05/04/23:               Internal Pelvic Floor: mild tenderness in bil levator ani, but no increase in tension; with internal rectal exam, she was demonstrating paradoxical contraction when trying to eccentrically lengthen with exhale and had very hard time correcting  Patient confirms identification and approves PT to assess internal pelvic floor and treatment  Yes  PELVIC MMT:   MMT eval  Vaginal 2/5, 3 second hold, 3 repeat contractions  Internal Anal Sphincter 1/5  External Anal Sphincter 2/5  Puborectalis 1/5  Diastasis Recti   (Blank rows = not tested)        TONE: WNL   PROLAPSE: Grade 2 anterior vaginal wall laxity  04/25/23: Curl-up test: abdominal distortion Transversus abdominus: week, difficulty getting active contraction (not been working on strengthening)    LUMBARAROM/PROM:  A/PROM A/PROM  Eval (% available) 11/09/22 (% available) 04/25/23 (% available)  Flexion 50 90 90  Extension 25, pain anteriorly  25, pain in low back 50  Right lateral flexion 50, pain anteriorly  75, pain on right 50, pain on Rt  Left lateral flexion 50, pain anteriorly  75, pain on right 75   (Blank rows = not tested)  11/09/22: No internal rectal or vaginal pelvic floor exam performed due to high level of skin irritation   Weak transversus abdominus contraction  Abdominal restriction and tenderness in bil lower quadrant  Unable to perform pelvic tilt with appropriate coordination  LUMBARAROM/PROM:  A/PROM A/PROM  Eval (% available) 11/09/22 (% available)  Flexion 50 90  Extension 25, pain anteriorly  25, pain in low back  Right lateral flexion 50, pain anteriorly  75, pain on right  Left lateral flexion 50, pain anteriorly  75, pain on right   (Blank rows = not tested) 07/21/22: standing prolapse assessment demonstrated grade 2 anterior vaginal wall laxity   06/08/22:               External Perineal Exam: WNL                              Internal Pelvic Floor: burning reported with palpation  of superficial muscles; deep aching/pressure with palpation of Lt levator ani   Patient confirms identification and approves PT to assess internal pelvic floor and treatment Yes  PELVIC MMT:   MMT eval  Vaginal 3/5  Internal Anal Sphincter 1/5  External Anal Sphincter 2/5  Puborectalis 1/5  Diastasis Recti   (Blank rows = not tested)         TONE: High in Lt levator ani   PROLAPSE: Not able to tell this session due to dyssynergic pelvic floor contraction     06/01/22: COGNITION: Overall cognitive status: Within functional limits for tasks assessed     SENSATION: Light touch: Appears intact Proprioception: Appears intact  GAIT: Comments: decreased hip extension, forward flexed trunk  POSTURE: rounded shoulders, forward head, decreased lumbar lordosis, increased thoracic kyphosis, posterior pelvic tilt, and flexed trunk    LUMBARAROM/PROM:  A/PROM A/PROM  Eval (% available)  Flexion 50  Extension 25, pain anteriorly   Right lateral flexion 50, pain anteriorly   Left lateral flexion 50, pain anteriorly    (Blank rows = not tested)   PALPATION:   General  Significant abdominal restriction and tenderness; decreased rib mobility with mobilization and breathing    TODAY'S TREATMENT:                                                                                                                              DATE:   01/24/24:  [facility] Adult PT Treatment:                                                DATE: 01/16/24 Pt seen for aquatic therapy today.  Treatment took place in water 3.5-4.75 ft in depth at the [facility] pool. Temp of water was 91.  Pt entered/exited the pool via stairs independently with bil rail. Pt requires buoyancy of water for support and to offload joints with strengthening exercises.  Seated water bench with 75% submersion Pt performed seated LE AROM exercises 20x in all planes, concurrent discussion of her status.  *75% depth water walking 6x each direction. VC for whatever UE felt good to her. * High knee slow march 4 lengths * UE On wall: heel/toe raises; hip abdct/ add, circumduction 15x eac2x5 * TrA set with 1/2 hollow blue noodle pull down to thighs  *decompression hang in deep end 1 min focused breath f/b 5 PF/TA contractions. Rest 1 min and repeat another set of 5 PF/TA  contractions with exhale.  Pt requires the buoyancy and hydrostatic pressure of water for support, and to offload joints by unweighting joint load by at least 50 % in navel deep water and by at least 75-80% in chest to neck deep water.  Viscosity of the water is needed for resistance of strengthening. Water current perturbations provides challenge to standing balance  requiring increased core activation.   01/10/24:Pt arrives for aquatic physical therapy. Treatment took place in 3.5-5.5 feet of water. Water temperature was 91 degrees F. Pt entered the pool via stairs independently using rails. Seated water bench with 75% submersion Pt performed seated LE AROM exercises 20x in all planes, with concurrent discussion of current status. -75% depth water walking 4x each holding the Q-tip for back support.VC for proper heel toe walking as pt was ambulating on her toes with forward walking. -decompression hang 3 min with xtra large white noodle -standing post pelvic tilt  against the wall 3 sec hold 6x, Vc to move/tilt from abdomin -Hip abd 10x Bil -Standing marching 10x Bil   01/09/2023 Manual: Supine: ILU bowel mobilization  Abdominal scar tissue mobilization Ileocecal valve mobilization External rectum mobilization Diaphragm release ILU mobilization Prone: Myofascial release to low back Soft tissue mobilization to lumbar paraspinals and glutes 12/25/23 Manual: Supine: ILU bowel mobilization  Abdominal scar tissue mobilization Ileocecal valve mobilization External rectum mobilization Diaphragm release ILU mobilization Prone: Myofascial release to low back Soft tissue mobilization to lumbar paraspinals and glutes  Therapeutic activities: Partner education on bowel mobilization techniques    12/20/23 Manual: Supine: ILU bowel mobilization  Abdominal scar tissue mobilization Ileocecal valve mobilization External rectum mobilization Diaphragm release Therapeutic  activities: Squats to table 2 x 10 3 way kick 10x each bil Side stepping + blue loop 3 laps  Heel raises 3 x 10 Staggered deadlift and pt education through bending in this position when performing functional tasks 10x bil  PATIENT EDUCATION:  Education details: intro to aquatic therapy, issued list of area pools (01/16/24)  Person educated: Patient Education method: Explanation, Demonstration, Tactile cues, Verbal cues Education comprehension: verbalized understanding  HOME EXERCISE PROGRAM: [id number]  ASSESSMENT:  CLINICAL IMPRESSION:Pt arrives with no pain just a fatigue. She was able to complete her routine in the pool without exacerbating her fatigue. Pt able to control her movemetns inth ewater much better in the water than when she first began. She reports she feels 'stronger.    OBJECTIVE IMPAIRMENTS: decreased activity tolerance, decreased coordination, decreased endurance, decreased mobility, decreased strength, increased fascial restrictions, increased muscle spasms, impaired tone, postural dysfunction, and pain.   ACTIVITY LIMITATIONS: lifting, bending, continence, and locomotion level  PARTICIPATION LIMITATIONS: interpersonal relationship, community activity, and occupation  PERSONAL FACTORS: 1 comorbidity: medical history are also affecting patient's functional outcome.   REHAB POTENTIAL: Good  CLINICAL DECISION MAKING: Stable/uncomplicated  EVALUATION COMPLEXITY: Low   GOALS: Goals reviewed with patient? Yes  SHORT TERM GOALS: Updated 10/10/23  Pt will be independent with HEP.   Baseline: Goal status: MET 07/12/22  2.  Pt will be independent with diaphragmatic breathing and down training activities in order to improve pelvic floor relaxation.  Baseline:  Goal status: MET 07/12/22  3.  Pt will be independent with the knack, urge suppression technique, and double voiding in order to improve bladder habits and decrease urinary incontinence.   Baseline:   Goal status: MET 09/22/22  4.  Pt will be independent with use of squatty potty, relaxed toileting mechanics, and improved bowel movement techniques in order to increase ease of bowel movements and complete evacuation.   Baseline:  Goal status: MET 11/09/22  5.  Pt will be able to correctly perform diaphragmatic breathing and appropriate pressure management in order to prevent worsening vaginal wall laxity and improve pelvic floor A/ROM.   Baseline:  Goal status: MET 01/18/23  LONG TERM GOALS: Updated 02/19/24  Pt  will be independent with advanced HEP.   Baseline:  Goal status: IN PROGRESS 12/20/23  2.  Pt will demonstrate normal pelvic floor muscle tone and A/ROM, able to achieve 4/5 strength with contractions and 10 sec endurance, in order to provide appropriate lumbopelvic support in functional activities.   Baseline: not assessed today per patient request - no current bladder issues and they have all resolved Goal status: IN PROGRESS 12/20/23  3.  Pt will increase all impaired lumbar A/ROM by 25% without pain.  Baseline: Pt seen a little bit of loss in some and improvement in extension Goal status:  IN PROGRESS 12/20/23  4.  Pt will report pain no higher than 3/10 with any activity. Baseline: pain currently 3/10, worst is an 8/10 and often corresponds to after she is eating Goal status:  IN PROGRESS 12/20/23  5.  Pt will report 0/10 pain with vaginal penetration in order to improve intimate relationship with partner.    Baseline: no recent intercourse attempts  Goal status:  IN PROGRESS 12/20/23  6.  Pt will be able to go 2-3 hours in between voids without urgency or incontinence in order to improve QOL and perform all functional activities with less difficulty.   Baseline:  Goal status: MET 02/21/23  7.  Pt will report no leaks with laughing, coughing, sneezing in order to improve comfort with interpersonal relationships and community activities.   Baseline: Pt states  that she feels 30-40% better Goal status:  MET 10/10/23  8.  Pt will have consistent bowel movements 4-5x/week without straining or pain.  Baseline:  Goal status:  MET 02/21/23  PLAN:  PT FREQUENCY: 1-2x/week  PT DURATION: 6 months  PLANNED INTERVENTIONS: Therapeutic exercises, Therapeutic activity, Neuromuscular re-education, Balance training, Gait training, Patient/Family education, Self Care, Joint mobilization, Dry Needling, Biofeedback, and Manual therapy  PLAN FOR NEXT SESSION:  Delon Darner, PTA 01/24/24 12:05 PM   [facility] [address] [place], [place], [address] Phone: [phone]   Fax:  [phone]   "

## 2024-01-25 ENCOUNTER — Ambulatory Visit

## 2024-01-25 ENCOUNTER — Other Ambulatory Visit: Payer: Self-pay

## 2024-01-25 ENCOUNTER — Ambulatory Visit: Payer: Self-pay

## 2024-01-25 ENCOUNTER — Other Ambulatory Visit (HOSPITAL_COMMUNITY): Payer: Self-pay

## 2024-01-25 DIAGNOSIS — M6281 Muscle weakness (generalized): Secondary | ICD-10-CM

## 2024-01-25 DIAGNOSIS — R279 Unspecified lack of coordination: Secondary | ICD-10-CM

## 2024-01-25 DIAGNOSIS — M62838 Other muscle spasm: Secondary | ICD-10-CM

## 2024-01-25 DIAGNOSIS — M5459 Other low back pain: Secondary | ICD-10-CM

## 2024-01-25 DIAGNOSIS — R102 Pelvic and perineal pain unspecified side: Secondary | ICD-10-CM

## 2024-01-25 NOTE — Therapy (Signed)
 " OUTPATIENT PHYSICAL THERAPY FEMALE PELVIC TREATMENT     Patient Name: Karen [name] MRN: [id number] DOB:1970/08/12, 54 y.o., female Today's Date: 01/25/2024  END OF SESSION:  PT End of Session - 01/25/24 0847     Visit Number 64    Date for Recertification  02/19/24    Authorization Type [employer]    PT Start Time [phone]    PT Stop Time 0925    PT Time Calculation (min) 38 min    Activity Tolerance Patient tolerated treatment well    Behavior During Therapy [facility] for tasks assessed/performed                      Past Medical History:  Diagnosis Date   Adenomyosis    Anemia    Arthritis 2013   L knee gets steroid injections    Arthritis, rheumatic, acute or subacute    Asthmatic bronchitis 01/31/2017   Back pain    Chronic constipation    Diarrhea    DVT of lower extremity (deep venous thrombosis) (HCC)    Dysmenorrhea    Endometriosis    Esophageal stricture    G6PD deficiency    GERD (gastroesophageal reflux disease)    History of kidney stones    History of uterine fibroid    IBS (irritable bowel syndrome)    Interstitial cystitis    Lactose intolerance    Migraine    with aura   Other hemochromatosis 06/10/2021   PONV (postoperative nausea and vomiting)    Recurrent upper respiratory infection (URI)    Sebaceous cyst of breast, right lower inner quadrant 10/25/2012   Excised 11/21/12    Sleep apnea    Syncope, non cardiac    Past Surgical History:  Procedure Laterality Date   ARTHROSCOPIC HAGLUNDS REPAIR     BREAST CYST EXCISION Right 11/21/2012   Procedure: CYST EXCISION BREAST;  Surgeon: Sherlean JINNY Laughter, MD;  Location: [facility];  Service: General;  Laterality: Right;   BREAST CYST EXCISION Bilateral 01/18/2022   Procedure: EXCISION OF SEBACEOUS CYST BILATERAL BREAST;  Surgeon: Vanderbilt Ned, MD;  Location:  SURGERY CENTER;  Service: General;  Laterality: Bilateral;   BREAST EXCISIONAL BIOPSY Right 11/2012    CESAREAN SECTION  04/19/1993   CHOLECYSTECTOMY     COLONOSCOPY  05/02/2011   Procedure: COLONOSCOPY;  Surgeon: Lynwood LITTIE Celestia Mickey., MD;  Location: [facility];  Service: Endoscopy;  Laterality: N/A;   colonoscopy  11/04/2014   DENTAL SURGERY  03/06/2018   2 surgeries ( 03/06/2018 and 04/06/2018)   to remove two separate benign tumors   ESOPHAGEAL MANOMETRY N/A 11/04/2015   Procedure: ESOPHAGEAL MANOMETRY (EM);  Surgeon: Lynwood Celestia, MD;  Location: [facility];  Service: Endoscopy;  Laterality: N/A;   ESOPHAGOGASTRODUODENOSCOPY  05/02/2011   Procedure: ESOPHAGOGASTRODUODENOSCOPY (EGD);  Surgeon: Lynwood LITTIE Celestia Mickey., MD;  Location: [facility];  Service: Endoscopy;  Laterality: N/A;   ESOPHAGOGASTRODUODENOSCOPY N/A 09/01/2014   Procedure: ESOPHAGOGASTRODUODENOSCOPY (EGD);  Surgeon: Lynwood Celestia, MD;  Location: [facility];  Service: Endoscopy;  Laterality: N/A;   IR CV LINE INJECTION  09/13/2023   IR CV LINE INJECTION  12/01/2023   IR IMAGING GUIDED PORT INSERTION  07/06/2023   IR IMAGING GUIDED PORT INSERTION  12/01/2023   IR REMOVAL TUN ACCESS W/ PORT W/O FL MOD SED  12/01/2023   KNEE SURGERY     LAPAROSCOPY     x 4   LESION REMOVAL N/A 01/18/2022   Procedure:  EXCISION OF SEBACEOUS CYSTS CHEST AND NECK;  Surgeon: Vanderbilt Ned, MD;  Location: [facility];  Service: General;  Laterality: N/A;   PH IMPEDANCE STUDY N/A 11/04/2015   Procedure: PH IMPEDANCE STUDY;  Surgeon: Lynwood Bohr, MD;  Location: [facility];  Service: Endoscopy;  Laterality: N/A;   VAGINAL HYSTERECTOMY  2010   TVH--ovaries remain   Patient Active Problem List   Diagnosis Date Noted   Sjogren syndrome with keratoconjunctivitis 12/09/2022   Hemochromatosis 06/10/2021   Elevated LFTs 04/04/2021   Colitis 04/04/2021   Bacterial overgrowth syndrome 05/21/2020   Obesity 05/21/2020   History of endometriosis 05/21/2020   Constipation due to outlet dysfunction 02/05/2020   Gastroesophageal reflux  disease 02/05/2020   Obstructive sleep apnea treated with continuous positive airway pressure (CPAP) 06/16/2017   Perennial allergic rhinitis with a nonallergic component 01/31/2017   Asthmatic bronchitis 01/31/2017   GI symptoms 01/31/2017   Family history of colon cancer 01/27/2015   Chest pain 12/13/2012   Dyspnea 12/13/2012   DVT of lower extremity (deep venous thrombosis) (HCC) 01/03/1990    PCP: Elliot Charm, MD  REFERRING PROVIDER: Cleotilde Ronal RAMAN, MD   REFERRING DIAG: M62.89 (ICD-10-CM) - Pelvic floor dysfunction  THERAPY DIAG:  Muscle weakness (generalized)  Other low back pain  Pelvic pain  Other muscle spasm  Unspecified lack of coordination  Rationale for Evaluation and Treatment: Rehabilitation  ONSET DATE: chronic  SUBJECTIVE:                                                                                                                                                                                           SUBJECTIVE STATEMENT:  Pt had cardiac MRI and everything was normal. She saw hematologist and she is now anemic after having 2 back to back phlebotomies. She has been doing well with aquatic therapy. She is finished antibiotics for SIBO. She is overall doing well.   PAIN: 01/25/2024 Are you having pain? Yes NPRS scale: 4/10 Pain location: Lower abdominal pain, Lt sided pain, low back pain  Pain type: turning, knotted, pressure Pain description: intermittent and constant   Aggravating factors: constipation or frequent bowel movements/constantly having bowel movements  Relieving factors: exercises (bowel massage, happy baby, spinal twists, walking)  PRECAUTIONS: None  WEIGHT BEARING RESTRICTIONS: No  FALLS:  Has patient fallen in last 6 months? No  LIVING ENVIRONMENT: Lives with: lives with their spouse Lives in: House/apartment  OCCUPATION: nurse, in admin  PLOF: Independent  PATIENT GOALS: decrease pain and have more normal  bowel movements  PERTINENT HISTORY:  Vaginal hysterectomy, DVT LE, endometriosis, interstitial cystitis, exploratory laps for endometriosis,  c-section, cholecystectomy, RA diagnosed 06/2023 Sexual abuse: No  BOWEL MOVEMENT: Pain with bowel movement: Yes Type of bowel movement:Type (Bristol Stool Scale) 1-7 (sometimes full range in the same bowel movement), Frequency sometimes many times a day, sometimes up to 4 days in between, and Strain Yes Fully empty rectum: No Leakage: No Pads: No Fiber supplement: No - has attempted metamucil and it made constipation worse  URINATION: Pain with urination: Yes Fully empty bladder: No Stream: varies  Urgency: Yes: all the time Frequency: multiple times an hours  Leakage: Urge to void, Walking to the bathroom, Coughing, Sneezing, Laughing, and Exercise Pads: Yes: daily  INTERCOURSE: Pain with intercourse: Initial Penetration and During Penetration Ability to have vaginal penetration:  Yes: with pain Climax: non painful WNL   PREGNANCY: Vaginal deliveries 1 Tearing Yes: 3rd degree tear C-section deliveries 1 Currently pregnant No  PROLAPSE: Periodically will feel vaginal bulging, heaviness in lower abdomen   OBJECTIVE:  10/10/23 Curl-up test: Minimal abdominal distortion focused in upper abdominals, largely improved Transversus abdominus: good core facilitation with exhale Sit-up test: 1/3  LUMBARAROM/PROM:  A/PROM A/PROM  Eval (% available) 11/09/22 (% available) 04/25/23 (% available) 10/10/23 (% available)  Flexion 50 90 90 100  Extension 25, pain anteriorly  25, pain in low back 50 75  Right lateral flexion 50, pain anteriorly  75, pain on right 50, pain on Rt 75  Left lateral flexion 50, pain anteriorly  75, pain on right 75 75   (Blank rows = not tested)    05/04/23:               Internal Pelvic Floor: mild tenderness in bil levator ani, but no increase in tension; with internal rectal exam, she was demonstrating paradoxical  contraction when trying to eccentrically lengthen with exhale and had very hard time correcting  Patient confirms identification and approves PT to assess internal pelvic floor and treatment Yes  PELVIC MMT:   MMT eval  Vaginal 2/5, 3 second hold, 3 repeat contractions  Internal Anal Sphincter 1/5  External Anal Sphincter 2/5  Puborectalis 1/5  Diastasis Recti   (Blank rows = not tested)        TONE: WNL   PROLAPSE: Grade 2 anterior vaginal wall laxity  04/25/23: Curl-up test: abdominal distortion Transversus abdominus: week, difficulty getting active contraction (not been working on strengthening)    LUMBARAROM/PROM:  A/PROM A/PROM  Eval (% available) 11/09/22 (% available) 04/25/23 (% available)  Flexion 50 90 90  Extension 25, pain anteriorly  25, pain in low back 50  Right lateral flexion 50, pain anteriorly  75, pain on right 50, pain on Rt  Left lateral flexion 50, pain anteriorly  75, pain on right 75   (Blank rows = not tested)  11/09/22: No internal rectal or vaginal pelvic floor exam performed due to high level of skin irritation   Weak transversus abdominus contraction  Abdominal restriction and tenderness in bil lower quadrant  Unable to perform pelvic tilt with appropriate coordination  LUMBARAROM/PROM:  A/PROM A/PROM  Eval (% available) 11/09/22 (% available)  Flexion 50 90  Extension 25, pain anteriorly  25, pain in low back  Right lateral flexion 50, pain anteriorly  75, pain on right  Left lateral flexion 50, pain anteriorly  75, pain on right   (Blank rows = not tested) 07/21/22: standing prolapse assessment demonstrated grade 2 anterior vaginal wall laxity   06/08/22:  External Perineal Exam: WNL                              Internal Pelvic Floor: burning reported with palpation of superficial muscles; deep aching/pressure with palpation of Lt levator ani   Patient confirms identification and approves PT to assess internal pelvic  floor and treatment Yes  PELVIC MMT:   MMT eval  Vaginal 3/5  Internal Anal Sphincter 1/5  External Anal Sphincter 2/5  Puborectalis 1/5  Diastasis Recti   (Blank rows = not tested)        TONE: High in Lt levator ani   PROLAPSE: Not able to tell this session due to dyssynergic pelvic floor contraction     06/01/22: COGNITION: Overall cognitive status: Within functional limits for tasks assessed     SENSATION: Light touch: Appears intact Proprioception: Appears intact  GAIT: Comments: decreased hip extension, forward flexed trunk  POSTURE: rounded shoulders, forward head, decreased lumbar lordosis, increased thoracic kyphosis, posterior pelvic tilt, and flexed trunk    LUMBARAROM/PROM:  A/PROM A/PROM  Eval (% available)  Flexion 50  Extension 25, pain anteriorly   Right lateral flexion 50, pain anteriorly   Left lateral flexion 50, pain anteriorly    (Blank rows = not tested)   PALPATION:   General  Significant abdominal restriction and tenderness; decreased rib mobility with mobilization and breathing    TODAY'S TREATMENT:                                                                                                                              DATE:  01/25/2024 Manual: Manual: Supine: ILU bowel mobilization  Abdominal scar tissue mobilization Ileocecal valve mobilization External rectum mobilization Diaphragm release ILU mobilization Prone: Myofascial release to low back Soft tissue mobilization to lumbar paraspinals and glutes  Therapeutic activities: HEP review Goal review   01/09/2023 Manual: Supine: ILU bowel mobilization  Abdominal scar tissue mobilization Ileocecal valve mobilization External rectum mobilization Diaphragm release ILU mobilization Prone: Myofascial release to low back Soft tissue mobilization to lumbar paraspinals and glutes 12/25/23 Manual: Supine: ILU bowel mobilization  Abdominal scar tissue  mobilization Ileocecal valve mobilization External rectum mobilization Diaphragm release ILU mobilization Prone: Myofascial release to low back Soft tissue mobilization to lumbar paraspinals and glutes  Therapeutic activities: Partner education on bowel mobilization techniques     PATIENT EDUCATION:  Education details: See above Person educated: Patient Education method: Explanation, Demonstration, Tactile cues, Verbal cues, and Handouts Education comprehension: verbalized understanding  HOME EXERCISE PROGRAM: [id number]  ASSESSMENT:  CLINICAL IMPRESSION: Pt has had varying progress in pelvic floor PT. There have been many factors in her overall health that has impacted bowel and bladder. In general her bladder control is much better. Her bowel movements are usually consistent and in general she does not strain with bowel movements or have issues with stool burden; however, this is also dependent on  medications, autoimmune disease, and hemochromatosis. She is overall happy with progress and feels independent with HEP. She reports feeling prepared to discharge skilled pelvic floor physical therapy at this time. She was encouraged to call with any questions or concerns.   OBJECTIVE IMPAIRMENTS: decreased activity tolerance, decreased coordination, decreased endurance, decreased mobility, decreased strength, increased fascial restrictions, increased muscle spasms, impaired tone, postural dysfunction, and pain.   ACTIVITY LIMITATIONS: lifting, bending, continence, and locomotion level  PARTICIPATION LIMITATIONS: interpersonal relationship, community activity, and occupation  PERSONAL FACTORS: 1 comorbidity: medical history are also affecting patient's functional outcome.   REHAB POTENTIAL: Good  CLINICAL DECISION MAKING: Stable/uncomplicated  EVALUATION COMPLEXITY: Low   GOALS: Goals reviewed with patient? Yes  SHORT TERM GOALS: Updated 10/10/23  Pt will be independent with  HEP.   Baseline: Goal status: MET 07/12/22  2.  Pt will be independent with diaphragmatic breathing and down training activities in order to improve pelvic floor relaxation.  Baseline:  Goal status: MET 07/12/22  3.  Pt will be independent with the knack, urge suppression technique, and double voiding in order to improve bladder habits and decrease urinary incontinence.   Baseline:  Goal status: MET 09/22/22  4.  Pt will be independent with use of squatty potty, relaxed toileting mechanics, and improved bowel movement techniques in order to increase ease of bowel movements and complete evacuation.   Baseline:  Goal status: MET 11/09/22  5.  Pt will be able to correctly perform diaphragmatic breathing and appropriate pressure management in order to prevent worsening vaginal wall laxity and improve pelvic floor A/ROM.   Baseline:  Goal status: MET 01/18/23  LONG TERM GOALS: Updated 10/10/23  Pt will be independent with advanced HEP.   Baseline:  Goal status: MET 01/25/2024  2.  Pt will demonstrate normal pelvic floor muscle tone and A/ROM, able to achieve 4/5 strength with contractions and 10 sec endurance, in order to provide appropriate lumbopelvic support in functional activities.   Baseline: not assessed today per patient request - no current bladder issues and they have all resolved Goal status: DISCHARGED 01/25/2024  3.  Pt will increase all impaired lumbar A/ROM by 25% without pain.  Baseline: Pt seen a little bit of loss in some and improvement in extension Goal status:  DISCHARGED 01/25/2024  4.  Pt will report pain no higher than 3/10 with any activity. Baseline: pain currently 3/10, worst is an 8/10 and often corresponds to after she is eating Goal status:  DISCHARGED 01/25/2024  5.  Pt will report 0/10 pain with vaginal penetration in order to improve intimate relationship with partner.    Baseline: no recent intercourse attempts  Goal status:  DISCHARGED  01/25/2024  6.  Pt will be able to go 2-3 hours in between voids without urgency or incontinence in order to improve QOL and perform all functional activities with less difficulty.   Baseline:  Goal status: MET 02/21/23  7.  Pt will report no leaks with laughing, coughing, sneezing in order to improve comfort with interpersonal relationships and community activities.   Baseline: Pt states that she feels 30-40% better Goal status:  MET 10/10/23  8.  Pt will have consistent bowel movements 4-5x/week without straining or pain.  Baseline:  Goal status:  MET 02/21/23  PLAN:  PT FREQUENCY: -  PT DURATION: -  PLANNED INTERVENTIONS: -  PLAN FOR NEXT SESSION: discharge pelvic floor PT  PHYSICAL THERAPY DISCHARGE SUMMARY  Visits from Start of Care: 64  Current functional level  related to goals / functional outcomes: Independent   Remaining deficits: See above   Education / Equipment: HEP   Patient agrees to discharge. Patient goals were partially met. Patient is being discharged due to being pleased with the current functional level.   Josette Mares, PT, DPT01/22/269:03 AM "

## 2024-01-26 ENCOUNTER — Other Ambulatory Visit (HOSPITAL_COMMUNITY): Payer: Self-pay

## 2024-01-29 ENCOUNTER — Ambulatory Visit: Admitting: Occupational Therapy

## 2024-01-29 ENCOUNTER — Other Ambulatory Visit (HOSPITAL_COMMUNITY): Payer: Self-pay

## 2024-01-30 ENCOUNTER — Other Ambulatory Visit (HOSPITAL_COMMUNITY): Payer: Self-pay

## 2024-01-30 ENCOUNTER — Other Ambulatory Visit: Payer: Self-pay

## 2024-01-30 ENCOUNTER — Encounter: Payer: Self-pay | Admitting: Allergy & Immunology

## 2024-01-30 ENCOUNTER — Encounter: Payer: Self-pay | Admitting: Hematology & Oncology

## 2024-01-30 ENCOUNTER — Ambulatory Visit: Admitting: Allergy & Immunology

## 2024-01-30 VITALS — BP 116/78 | HR 84 | Temp 98.2°F | Resp 18 | Ht 67.0 in | Wt 219.9 lb

## 2024-01-30 DIAGNOSIS — L508 Other urticaria: Secondary | ICD-10-CM

## 2024-01-30 DIAGNOSIS — D75A Glucose-6-phosphate dehydrogenase (G6PD) deficiency without anemia: Secondary | ICD-10-CM | POA: Diagnosis not present

## 2024-01-30 DIAGNOSIS — J4520 Mild intermittent asthma, uncomplicated: Secondary | ICD-10-CM | POA: Diagnosis not present

## 2024-01-30 DIAGNOSIS — R7982 Elevated C-reactive protein (CRP): Secondary | ICD-10-CM

## 2024-01-30 DIAGNOSIS — L231 Allergic contact dermatitis due to adhesives: Secondary | ICD-10-CM | POA: Diagnosis not present

## 2024-01-30 DIAGNOSIS — R1310 Dysphagia, unspecified: Secondary | ICD-10-CM | POA: Diagnosis not present

## 2024-01-30 DIAGNOSIS — B999 Unspecified infectious disease: Secondary | ICD-10-CM | POA: Diagnosis not present

## 2024-01-30 MED ORDER — ALBUTEROL SULFATE HFA 108 (90 BASE) MCG/ACT IN AERS
2.0000 | INHALATION_SPRAY | RESPIRATORY_TRACT | 1 refills | Status: AC | PRN
  Filled 2024-01-30: qty 6.7, 17d supply, fill #0

## 2024-01-30 MED ORDER — LANSOPRAZOLE 15 MG PO CPDR
30.0000 mg | DELAYED_RELEASE_CAPSULE | Freq: Every day | ORAL | 5 refills | Status: AC
  Filled 2024-01-30: qty 60, 30d supply, fill #0

## 2024-01-30 MED ORDER — FLUTICASONE PROPIONATE 50 MCG/ACT NA SUSP
2.0000 | Freq: Every day | NASAL | 5 refills | Status: AC
  Filled 2024-01-30: qty 16, 30d supply, fill #0

## 2024-01-30 NOTE — Patient Instructions (Addendum)
 1. SOB (shortness of breath) - Lung testing looks amazing today.  - You can continue with the albuterol  2 puffs every 4-6 hours.  2. Dysphagia and chronic diarrhea - Continue with the Prevacid  30mg  daily.  - We have discussed immunoglobulin infusions as an immunomodulatory agent, but this will be hard to get approved.  - But this would be hard to get set up since your labs have been normal.  - I had another patient (not as complicated as you, obviously!) who had chronic diarrhea and it improved when we started her on immunoglobulin, but she also had recurrent infections and inadequate response to Streptococcus pneumoniae on labs).  - Immunoglobulin also increases your risk of blood clots and you are already at high risk of this because of your hemochromatosis.  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   3. Recurrent sinusitis  - Continue with Flonase  one spray per nostril daily.  - Your titers were very protective following the vaccination. - We can do a Streptococcal avidity assay in the future if needed. - But overall this seems to be very stable and the least of your worries.   4. G6PD deficiency  - Continue to follow the dietary changes.  - Keep seeing the RD as you are doing.   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  6. Urticaria/itching - We will get some labs to look for weird causes of hives/itching. - I think the patch testing is going to be enlightening as well.  - Continue with the clobetasol  or triamcinolone  for the lesions.   7. Follow up as scheduled.     Please inform us  of any Emergency Department visits, hospitalizations, or changes in symptoms. Call us  before going to the ED for breathing or allergy symptoms since we might be able to fit you in for a sick visit. Feel free to  contact us  anytime with any questions, problems, or concerns.  It was a pleasure to see you again today!  Websites that have reliable patient information: 1. American Academy of Asthma, Allergy, and Immunology: www.aaaai.org 2. Food Allergy Research and Education (FARE): foodallergy.org 3. Mothers of Asthmatics: http://www.asthmacommunitynetwork.org 4. American College of Allergy, Asthma, and Immunology: www.acaai.org      Like us  on [employer] and Instagram for our latest updates!      A healthy democracy works best when [employer] participate! Make sure you are registered to vote! If you have moved or changed any of your contact information, you will need to get this updated before voting! Scan the QR codes below to learn more!

## 2024-01-30 NOTE — Progress Notes (Signed)
 "  FOLLOW UP  Date of Service/Encounter:  01/30/24   Assessment:   SOB (shortness of breath) - largely resolved   3mm RML stable lung nodule (benign per imaging guidelines)   Diarrhea - with a history of SIBO s/p treatment by Dr. 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 SOB (shortness of breath) - Lung testing looks amazing today.  - You can continue with the albuterol  2 puffs every 4-6 hours.  2. Dysphagia and chronic diarrhea - Continue with the Prevacid  30mg  daily.  - We have discussed immunoglobulin infusions as an immunomodulatory agent, but this will be hard to get approved.  - But this would be hard to get set up since your labs have been normal.  - I had another patient (not as complicated as you, obviously!) who had chronic diarrhea and it improved when we started her on immunoglobulin, but she also had recurrent infections and inadequate response to Streptococcus pneumoniae on labs).  - Immunoglobulin also increases your risk of blood clots and you are already at high risk of this because of your hemochromatosis.  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   3. Recurrent sinusitis  - Continue with Flonase  one spray per nostril daily.  - Your titers were very protective following the vaccination. - We can do a Streptococcal avidity assay in the future if needed. - But overall  this seems to be very stable and the least of your worries.   4. G6PD deficiency  - Continue to follow the dietary changes.  - Keep seeing the RD as you are doing.   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  6. Urticaria/itching - We will get some labs to look for weird causes of hives/itching. - I think the patch testing is going to be enlightening as well.  - Continue with the clobetasol  or triamcinolone  for the lesions.   7. Follow up as scheduled.   Subjective:   Karen [name] is a 54 y.o. female presenting today for follow up of  Chief Complaint  Patient presents with   Follow-up    Still experiencing shortness of breath. Having outbreaks on skins.     Karen [name] has a history of the following: Patient Active Problem List   Diagnosis Date Noted   Sjogren syndrome with keratoconjunctivitis 12/09/2022   Hemochromatosis 06/10/2021   Elevated LFTs 04/04/2021   Colitis 04/04/2021   Bacterial overgrowth syndrome 05/21/2020   Obesity 05/21/2020   History of endometriosis 05/21/2020   Constipation due to outlet dysfunction 02/05/2020   Gastroesophageal reflux disease 02/05/2020   Obstructive sleep apnea treated with continuous positive airway pressure (CPAP) 06/16/2017   Perennial allergic rhinitis with a nonallergic component 01/31/2017   Asthmatic bronchitis 01/31/2017   GI symptoms  01/31/2017   Family history of colon cancer 01/27/2015   Chest pain 12/13/2012   Dyspnea 12/13/2012   DVT of lower extremity (deep venous thrombosis) (HCC) 01/03/1990    History obtained from: chart review and patient.  Discussed the use of AI scribe software for clinical note transcription with the patient and/or guardian, who gave verbal consent to proceed.  Karen [name] is a 54 y.o. female presenting for a follow up visit.  She was last  seen in December 2025.  At that time, lung testing looked pretty normal.  For her dysphagia, she continue with Prevacid  daily.  She had SIBO testing and Helio back to pylori testing which was pending.  For her with sinusitis, we continue with Flonase .  We talked about doing a streptococcal avidity assay.  She continue to follow with her registered dietitian for her history of G6PD deficiency.  She also has a congenital disorder of glycosylation via genetic testing, although we were not sure how important this was.  She was worried about contact dermatitis, so she was scheduled for patch placement with Wanda Craze, which is scheduled for March.  Since last visit, she has continued to have episodes of itching and a rash.  She experiences allergic reactions and skin rashes, particularly after medical procedures involving adhesive tapes and glues. The rashes are red and itchy, lasting from a day to two weeks, with the worst reaction occurring on her chest. She suspects allergies to the glue and Tegaderm used during procedures, as well as chlorhexidine  and chloroquine. She uses clobetasol  and Penlac for topical treatment. No hives are reported.  She has patch placement scheduled for March 9th.  I told her she could bring samples of tape to use as well.  We can also test any topical ointments that she is concerned about, including Emla  cream which did cause 1 reaction.  She has a history of hemochromatosis and has undergone two phlebotomies, which led to anemia with a hemoglobin level of 10.1. Her niece also has iron deficiency anemia and hemochromatosis.  She has rheumatoid arthritis and osteoarthritis, currently managed with Humira . She has received steroid injections in her wrist and back, and is undergoing aquatic therapy to help with symptoms. She reports numbness in her left leg and a slipped disc in her back.  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 She reports yellow stools and a positive bilirubin test, with ongoing monitoring by her GI specialist.  She experiences migraines and has been managing them with medication. She is also seeing a nutritionist and keeping a food journal to identify any food-related allergies.  She had a port replaced due to swelling and pain, which was initially placed against a nerve. The new port is functioning well without swelling or pain.  She had to have it replaced 4 months after the initial placement and after that it worked very well.  Otherwise, there have been no changes to her past medical history, surgical history, family history, or social history.    Review of systems otherwise negative other than that mentioned in the HPI.    Objective:   Blood pressure 116/78, pulse 84, temperature 98.2 F (36.8 C), temperature source Temporal, resp. rate 18, height 5' 7 (1.702 m), weight 219 lb 14.4 oz (99.7 kg), SpO2 100%. Body mass index is 34.44 kg/m.    Physical Exam Vitals reviewed.  Constitutional:      Appearance: Normal appearance.  She is well-developed. She is not ill-appearing.     Comments: Anxious, but seems more at ease compared to previous visit.   HENT:     Head: Normocephalic and atraumatic.     Right Ear: Tympanic membrane, ear canal and external ear normal. No drainage, swelling or tenderness. Tympanic membrane is not injected, scarred, erythematous, retracted or bulging.     Left Ear: Tympanic membrane, ear canal and external ear normal. No drainage, swelling or tenderness. Tympanic membrane is not injected, scarred, erythematous, retracted or bulging.     Nose: No nasal deformity, septal deviation, mucosal edema or rhinorrhea.     Right Turbinates: Enlarged, swollen and pale.     Left Turbinates: Enlarged, swollen and pale.     Right Sinus: No maxillary sinus tenderness or frontal sinus tenderness.     Left Sinus: No  maxillary sinus tenderness or frontal sinus tenderness.     Comments: No polyps noted.    Mouth/Throat:     Lips: Pink.     Mouth: Mucous membranes are moist. Mucous membranes are not pale and not dry.     Pharynx: Uvula midline.     Comments: Cobblestoning minimally present.  Eyes:     General: Lids are normal. Allergic shiner present.        Right eye: No discharge.        Left eye: No discharge.     Conjunctiva/sclera: Conjunctivae normal.     Right eye: Right conjunctiva is not injected. No chemosis.    Left eye: Left conjunctiva is not injected. No chemosis.    Pupils: Pupils are equal, round, and reactive to light.  Cardiovascular:     Rate and Rhythm: Normal rate and regular rhythm.     Heart sounds: Normal heart sounds.  Pulmonary:     Effort: Pulmonary effort is normal. No tachypnea, accessory muscle usage or respiratory distress.     Breath sounds: Normal breath sounds. No wheezing, rhonchi or rales.     Comments: Moving air well in all lung fields. No increased work of breathing noted. No wheezing or crackles noted.  Chest:     Chest wall: No tenderness.  Abdominal:     Tenderness: There is no abdominal tenderness. There is no guarding or rebound.  Lymphadenopathy:     Head:     Right side of head: No submandibular, tonsillar or occipital adenopathy.     Left side of head: No submandibular, tonsillar or occipital adenopathy.     Cervical: No cervical adenopathy.  Skin:    General: Skin is warm.     Capillary Refill: Capillary refill takes less than 2 seconds.     Coloration: Skin is not pale.     Findings: No abrasion, erythema, petechiae or rash. Rash is not papular, urticarial or vesicular.     Comments: No eczematous or urticarial lesions noted. Skin is clear.   Neurological:     Mental Status: She is alert.  Psychiatric:        Behavior: Behavior is cooperative.      Diagnostic studies:    Spirometry: results normal (FEV1: 1.81/73%, FVC: 2.46/79%,  FEV1/FVC: 74%).    Spirometry consistent with normal pattern.   Allergy Studies: labs sent instead to look for weird causes of itching.       Marty Shaggy, MD  [facility]        "

## 2024-01-31 ENCOUNTER — Encounter: Payer: Self-pay | Admitting: Hematology & Oncology

## 2024-01-31 ENCOUNTER — Ambulatory Visit: Payer: Self-pay | Admitting: Physical Therapy

## 2024-01-31 ENCOUNTER — Encounter: Payer: Self-pay | Admitting: Physical Therapy

## 2024-01-31 ENCOUNTER — Other Ambulatory Visit (HOSPITAL_COMMUNITY): Payer: Self-pay

## 2024-01-31 ENCOUNTER — Telehealth: Payer: Self-pay | Admitting: Allergy & Immunology

## 2024-01-31 DIAGNOSIS — R279 Unspecified lack of coordination: Secondary | ICD-10-CM

## 2024-01-31 DIAGNOSIS — M6281 Muscle weakness (generalized): Secondary | ICD-10-CM | POA: Diagnosis not present

## 2024-01-31 DIAGNOSIS — R102 Pelvic and perineal pain unspecified side: Secondary | ICD-10-CM

## 2024-01-31 DIAGNOSIS — M5459 Other low back pain: Secondary | ICD-10-CM

## 2024-01-31 DIAGNOSIS — M62838 Other muscle spasm: Secondary | ICD-10-CM

## 2024-01-31 NOTE — Telephone Encounter (Signed)
 Renate WAS SEEN WITH ENDO ON 01/29/24 AT 2:00 PM WITH DR. FAYTHE.

## 2024-01-31 NOTE — Therapy (Signed)
 " OUTPATIENT PHYSICAL THERAPY FEMALE PELVIC TREATMENT  Patient Name: Karen [name] MRN: [id number] DOB:04-Aug-1970, 54 y.o., female Today's Date: 01/31/2024  END OF SESSION:  PT End of Session - 01/31/24 0930     Visit Number 65    Date for Recertification  02/19/24    Authorization Type [employer]    Authorization Time Period signed dry needling consent form 08/08/23    PT Start Time 0928    PT Stop Time 1015    PT Time Calculation (min) 47 min    Activity Tolerance Patient tolerated treatment well    Behavior During Therapy [facility] for tasks assessed/performed            Past Medical History:  Diagnosis Date   Adenomyosis    Anemia    Arthritis 2013   L knee gets steroid injections    Arthritis, rheumatic, acute or subacute    Asthmatic bronchitis 01/31/2017   Back pain    Chronic constipation    Diarrhea    DVT of lower extremity (deep venous thrombosis) (HCC)    Dysmenorrhea    Endometriosis    Esophageal stricture    G6PD deficiency    GERD (gastroesophageal reflux disease)    History of kidney stones    History of uterine fibroid    IBS (irritable bowel syndrome)    Interstitial cystitis    Lactose intolerance    Migraine    with aura   Other hemochromatosis 06/10/2021   PONV (postoperative nausea and vomiting)    Recurrent upper respiratory infection (URI)    Sebaceous cyst of breast, right lower inner quadrant 10/25/2012   Excised 11/21/12    Sleep apnea    Syncope, non cardiac    Past Surgical History:  Procedure Laterality Date   ARTHROSCOPIC HAGLUNDS REPAIR     BREAST CYST EXCISION Right 11/21/2012   Procedure: CYST EXCISION BREAST;  Surgeon: Sherlean JINNY Laughter, MD;  Location: [facility];  Service: General;  Laterality: Right;   BREAST CYST EXCISION Bilateral 01/18/2022   Procedure: EXCISION OF SEBACEOUS CYST BILATERAL BREAST;  Surgeon: Vanderbilt Ned, MD;  Location: [facility];  Service: General;  Laterality: Bilateral;    BREAST EXCISIONAL BIOPSY Right 11/2012   CESAREAN SECTION  04/19/1993   CHOLECYSTECTOMY     COLONOSCOPY  05/02/2011   Procedure: COLONOSCOPY;  Surgeon: Lynwood LITTIE Celestia Mickey., MD;  Location: [facility];  Service: Endoscopy;  Laterality: N/A;   colonoscopy  11/04/2014   DENTAL SURGERY  03/06/2018   2 surgeries ( 03/06/2018 and 04/06/2018)   to remove two separate benign tumors   ESOPHAGEAL MANOMETRY N/A 11/04/2015   Procedure: ESOPHAGEAL MANOMETRY (EM);  Surgeon: Lynwood Celestia, MD;  Location: [facility];  Service: Endoscopy;  Laterality: N/A;   ESOPHAGOGASTRODUODENOSCOPY  05/02/2011   Procedure: ESOPHAGOGASTRODUODENOSCOPY (EGD);  Surgeon: Lynwood LITTIE Celestia Mickey., MD;  Location: [facility];  Service: Endoscopy;  Laterality: N/A;   ESOPHAGOGASTRODUODENOSCOPY N/A 09/01/2014   Procedure: ESOPHAGOGASTRODUODENOSCOPY (EGD);  Surgeon: Lynwood Celestia, MD;  Location: [facility];  Service: Endoscopy;  Laterality: N/A;   IR CV LINE INJECTION  09/13/2023   IR CV LINE INJECTION  12/01/2023   IR IMAGING GUIDED PORT INSERTION  07/06/2023   IR IMAGING GUIDED PORT INSERTION  12/01/2023   IR REMOVAL TUN ACCESS W/ PORT W/O FL MOD SED  12/01/2023   KNEE SURGERY     LAPAROSCOPY     x 4   LESION REMOVAL N/A 01/18/2022   Procedure: EXCISION  OF SEBACEOUS CYSTS CHEST AND NECK;  Surgeon: Vanderbilt Ned, MD;  Location:  [facility];  Service: General;  Laterality: N/A;   PH IMPEDANCE STUDY N/A 11/04/2015   Procedure: PH IMPEDANCE STUDY;  Surgeon: Lynwood Bohr, MD;  Location: [facility];  Service: Endoscopy;  Laterality: N/A;   VAGINAL HYSTERECTOMY  2010   TVH--ovaries remain   Patient Active Problem List   Diagnosis Date Noted   Sjogren syndrome with keratoconjunctivitis 12/09/2022   Hemochromatosis 06/10/2021   Elevated LFTs 04/04/2021   Colitis 04/04/2021   Bacterial overgrowth syndrome 05/21/2020   Obesity 05/21/2020   History of endometriosis 05/21/2020   Constipation due to outlet dysfunction  02/05/2020   Gastroesophageal reflux disease 02/05/2020   Obstructive sleep apnea treated with continuous positive airway pressure (CPAP) 06/16/2017   Perennial allergic rhinitis with a nonallergic component 01/31/2017   Asthmatic bronchitis 01/31/2017   GI symptoms 01/31/2017   Family history of colon cancer 01/27/2015   Chest pain 12/13/2012   Dyspnea 12/13/2012   DVT of lower extremity (deep venous thrombosis) (HCC) 01/03/1990    PCP: Elliot Charm, MD  REFERRING PROVIDER: Cleotilde Ronal RAMAN, MD   REFERRING DIAG: M62.89 (ICD-10-CM) - Pelvic floor dysfunction  THERAPY DIAG:  Muscle weakness (generalized)  Other low back pain  Pelvic pain  Other muscle spasm  Unspecified lack of coordination  Rationale for Evaluation and Treatment: Rehabilitation  ONSET DATE: chronic  SUBJECTIVE:                                                                                                                                                                                           SUBJECTIVE STATEMENT: Pt reports doing very well after her last aquatic visit. No current pain.   PAIN:  Are you having pain? No NPRS scale: /10 Pain location Pain type: ache Pain description: intermittent and constant   Aggravating factors: constipation or frequent bowel movements/constantly having bowel movements  Relieving factors: exercises (bowel massage, happy baby, spinal twists, walking)  PRECAUTIONS: None  WEIGHT BEARING RESTRICTIONS: No  FALLS:  Has patient fallen in last 6 months? No  LIVING ENVIRONMENT: Lives with: lives with their spouse Lives in: House/apartment  OCCUPATION: nurse, in admin  PLOF: Independent  PATIENT GOALS: decrease pain and have more normal bowel movements  PERTINENT HISTORY:  Vaginal hysterectomy, DVT LE, endometriosis, interstitial cystitis, exploratory laps for endometriosis, c-section, cholecystectomy, RA diagnosed 06/2023 Sexual abuse: No  BOWEL  MOVEMENT: Pain with bowel movement: Yes Type of bowel movement:Type (Bristol Stool Scale) 1-7 (sometimes full range in the same bowel movement), Frequency sometimes many times a day, sometimes up to 4 days  in between, and Strain Yes Fully empty rectum: No Leakage: No Pads: No Fiber supplement: No - has attempted metamucil and it made constipation worse  URINATION: Pain with urination: Yes Fully empty bladder: No Stream: varies  Urgency: Yes: all the time Frequency: multiple times an hours  Leakage: Urge to void, Walking to the bathroom, Coughing, Sneezing, Laughing, and Exercise Pads: Yes: daily  INTERCOURSE: Pain with intercourse: Initial Penetration and During Penetration Ability to have vaginal penetration:  Yes: with pain Climax: non painful WNL   PREGNANCY: Vaginal deliveries 1 Tearing Yes: 3rd degree tear C-section deliveries 1 Currently pregnant No  PROLAPSE: Periodically will feel vaginal bulging, heaviness in lower abdomen   OBJECTIVE:  10/10/23 Curl-up test: Minimal abdominal distortion focused in upper abdominals, largely improved Transversus abdominus: good core facilitation with exhale Sit-up test: 1/3  LUMBARAROM/PROM:  A/PROM A/PROM  Eval (% available) 11/09/22 (% available) 04/25/23 (% available) 10/10/23 (% available)  Flexion 50 90 90 100  Extension 25, pain anteriorly  25, pain in low back 50 75  Right lateral flexion 50, pain anteriorly  75, pain on right 50, pain on Rt 75  Left lateral flexion 50, pain anteriorly  75, pain on right 75 75   (Blank rows = not tested)    05/04/23:               Internal Pelvic Floor: mild tenderness in bil levator ani, but no increase in tension; with internal rectal exam, she was demonstrating paradoxical contraction when trying to eccentrically lengthen with exhale and had very hard time correcting  Patient confirms identification and approves PT to assess internal pelvic floor and treatment Yes  PELVIC MMT:    MMT eval  Vaginal 2/5, 3 second hold, 3 repeat contractions  Internal Anal Sphincter 1/5  External Anal Sphincter 2/5  Puborectalis 1/5  Diastasis Recti   (Blank rows = not tested)        TONE: WNL   PROLAPSE: Grade 2 anterior vaginal wall laxity  04/25/23: Curl-up test: abdominal distortion Transversus abdominus: week, difficulty getting active contraction (not been working on strengthening)    LUMBARAROM/PROM:  A/PROM A/PROM  Eval (% available) 11/09/22 (% available) 04/25/23 (% available)  Flexion 50 90 90  Extension 25, pain anteriorly  25, pain in low back 50  Right lateral flexion 50, pain anteriorly  75, pain on right 50, pain on Rt  Left lateral flexion 50, pain anteriorly  75, pain on right 75   (Blank rows = not tested)  11/09/22: No internal rectal or vaginal pelvic floor exam performed due to high level of skin irritation   Weak transversus abdominus contraction  Abdominal restriction and tenderness in bil lower quadrant  Unable to perform pelvic tilt with appropriate coordination  LUMBARAROM/PROM:  A/PROM A/PROM  Eval (% available) 11/09/22 (% available)  Flexion 50 90  Extension 25, pain anteriorly  25, pain in low back  Right lateral flexion 50, pain anteriorly  75, pain on right  Left lateral flexion 50, pain anteriorly  75, pain on right   (Blank rows = not tested) 07/21/22: standing prolapse assessment demonstrated grade 2 anterior vaginal wall laxity   06/08/22:               External Perineal Exam: WNL                              Internal Pelvic Floor: burning reported with palpation  of superficial muscles; deep aching/pressure with palpation of Lt levator ani   Patient confirms identification and approves PT to assess internal pelvic floor and treatment Yes  PELVIC MMT:   MMT eval  Vaginal 3/5  Internal Anal Sphincter 1/5  External Anal Sphincter 2/5  Puborectalis 1/5  Diastasis Recti   (Blank rows = not tested)        TONE: High in  Lt levator ani   PROLAPSE: Not able to tell this session due to dyssynergic pelvic floor contraction     06/01/22: COGNITION: Overall cognitive status: Within functional limits for tasks assessed     SENSATION: Light touch: Appears intact Proprioception: Appears intact  GAIT: Comments: decreased hip extension, forward flexed trunk  POSTURE: rounded shoulders, forward head, decreased lumbar lordosis, increased thoracic kyphosis, posterior pelvic tilt, and flexed trunk    LUMBARAROM/PROM:  A/PROM A/PROM  Eval (% available)  Flexion 50  Extension 25, pain anteriorly   Right lateral flexion 50, pain anteriorly   Left lateral flexion 50, pain anteriorly    (Blank rows = not tested)   PALPATION:   General  Significant abdominal restriction and tenderness; decreased rib mobility with mobilization and breathing    TODAY'S TREATMENT:                                                                                                                              DATE:   01/24/24:Pt seen for aquatic therapy today.  Treatment took place in water 3.5-4.75 ft in depth at the [facility] pool. Temp of water was 91.  Pt entered/exited the pool via stairs independently with bil rail. Pt requires buoyancy of water for support and to offload joints with strengthening exercises.  Seated water bench with 75% submersion Pt performed seated LE AROM exercises 20x in all planes, concurrent discussion of her status.  *75% depth water walking 8x each direction. Added arm push/pull with forward walking using rainbow floats * High knee slow march 4 lengths * UE On wall: heel/toe raises; hip abdct/ add, circumduction, and flex/ext: 20x each Bil * TrA set with 1/2 hollow blue noodle pull down to thighs 10x *decompression hang in deep end 1 min focused breath f/b 5 PF/TA holding 8 sec. Rest 1 min and repeat another set of 5 PF/TA contractions with exhale holding 8 sec.  Pt requires the buoyancy  and hydrostatic pressure of water for support, and to offload joints by unweighting joint load by at least 50 % in navel deep water and by at least 75-80% in chest to neck deep water.  Viscosity of the water is needed for resistance of strengthening. Water current perturbations provides challenge to standing balance requiring increased core activation.   [facility] Adult PT Treatment:  DATE: 01/16/24 Pt seen for aquatic therapy today.  Treatment took place in water 3.5-4.75 ft in depth at the [facility] pool. Temp of water was 91.  Pt entered/exited the pool via stairs independently with bil rail. Pt requires buoyancy of water for support and to offload joints with strengthening exercises.  Seated water bench with 75% submersion Pt performed seated LE AROM exercises 20x in all planes, concurrent discussion of her status.  *75% depth water walking 6x each direction. VC for whatever UE felt good to her. * High knee slow march 4 lengths * UE On wall: heel/toe raises; hip abdct/ add, circumduction 15x [id number] * TrA set with 1/2 hollow blue noodle pull down to thighs  *decompression hang in deep end 1 min focused breath f/b 5 PF/TA contractions. Rest 1 min and repeat another set of 5 PF/TA contractions with exhale.  Pt requires the buoyancy and hydrostatic pressure of water for support, and to offload joints by unweighting joint load by at least 50 % in navel deep water and by at least 75-80% in chest to neck deep water.  Viscosity of the water is needed for resistance of strengthening. Water current perturbations provides challenge to standing balance requiring increased core activation.   01/10/24:Pt arrives for aquatic physical therapy. Treatment took place in 3.5-5.5 feet of water. Water temperature was 91 degrees F. Pt entered the pool via stairs independently using rails. Seated water bench with 75% submersion Pt performed seated LE AROM exercises 20x  in all planes, with concurrent discussion of current status. -75% depth water walking 4x each holding the Q-tip for back support.VC for proper heel toe walking as pt was ambulating on her toes with forward walking. -decompression hang 3 min with xtra large white noodle -standing post pelvic tilt  against the wall 3 sec hold 6x, Vc to move/tilt from abdomin -Hip abd 10x Bil -Standing marching 10x Bil   PATIENT EDUCATION:  Education details: intro to aquatic therapy, issued list of area pools (01/16/24)  Person educated: Patient Education method: Explanation, Demonstration, Tactile cues, Verbal cues Education comprehension: verbalized understanding  HOME EXERCISE PROGRAM: [id number]  ASSESSMENT:  CLINICAL IMPRESSION: Pt arrives for aquatic PT with no pain and no excessive fatigue. Pt is feeling very positive about her response and progress with water exercises. Again, small progressions to increase her workload gradually including longer holds for PF/TA contractions. Pt is interested in return to outdoor walking when the weather gets warmer.  OBJECTIVE IMPAIRMENTS: decreased activity tolerance, decreased coordination, decreased endurance, decreased mobility, decreased strength, increased fascial restrictions, increased muscle spasms, impaired tone, postural dysfunction, and pain.   ACTIVITY LIMITATIONS: lifting, bending, continence, and locomotion level  PARTICIPATION LIMITATIONS: interpersonal relationship, community activity, and occupation  PERSONAL FACTORS: 1 comorbidity: medical history are also affecting patient's functional outcome.   REHAB POTENTIAL: Good  CLINICAL DECISION MAKING: Stable/uncomplicated  EVALUATION COMPLEXITY: Low   GOALS: Goals reviewed with patient? Yes  SHORT TERM GOALS: Updated 10/10/23  Pt will be independent with HEP.   Baseline: Goal status: MET 07/12/22  2.  Pt will be independent with diaphragmatic breathing and down training activities in order  to improve pelvic floor relaxation.  Baseline:  Goal status: MET 07/12/22  3.  Pt will be independent with the knack, urge suppression technique, and double voiding in order to improve bladder habits and decrease urinary incontinence.   Baseline:  Goal status: MET 09/22/22  4.  Pt will be independent with use of squatty potty, relaxed toileting mechanics,  and improved bowel movement techniques in order to increase ease of bowel movements and complete evacuation.   Baseline:  Goal status: MET 11/09/22  5.  Pt will be able to correctly perform diaphragmatic breathing and appropriate pressure management in order to prevent worsening vaginal wall laxity and improve pelvic floor A/ROM.   Baseline:  Goal status: MET 01/18/23  LONG TERM GOALS: Updated 02/19/24  Pt will be independent with advanced HEP.   Baseline:  Goal status: IN PROGRESS 12/20/23  2.  Pt will demonstrate normal pelvic floor muscle tone and A/ROM, able to achieve 4/5 strength with contractions and 10 sec endurance, in order to provide appropriate lumbopelvic support in functional activities.   Baseline: not assessed today per patient request - no current bladder issues and they have all resolved Goal status: IN PROGRESS 12/20/23  3.  Pt will increase all impaired lumbar A/ROM by 25% without pain.  Baseline: Pt seen a little bit of loss in some and improvement in extension Goal status:  IN PROGRESS 12/20/23  4.  Pt will report pain no higher than 3/10 with any activity. Baseline: pain currently 3/10, worst is an 8/10 and often corresponds to after she is eating Goal status:  IN PROGRESS 12/20/23  5.  Pt will report 0/10 pain with vaginal penetration in order to improve intimate relationship with partner.    Baseline: no recent intercourse attempts  Goal status:  IN PROGRESS 12/20/23  6.  Pt will be able to go 2-3 hours in between voids without urgency or incontinence in order to improve QOL and perform all functional  activities with less difficulty.   Baseline:  Goal status: MET 02/21/23  7.  Pt will report no leaks with laughing, coughing, sneezing in order to improve comfort with interpersonal relationships and community activities.   Baseline: Pt states that she feels 30-40% better Goal status:  MET 10/10/23  8.  Pt will have consistent bowel movements 4-5x/week without straining or pain.  Baseline:  Goal status:  MET 02/21/23  PLAN:  PT FREQUENCY: 1-2x/week  PT DURATION: 6 months  PLANNED INTERVENTIONS: Therapeutic exercises, Therapeutic activity, Neuromuscular re-education, Balance training, Gait training, Patient/Family education, Self Care, Joint mobilization, Dry Needling, Biofeedback, and Manual therapy  PLAN FOR NEXT SESSION: ERO next   Delon Darner, PTA 01/31/24 10:08 AM   [facility] [address] [place], [place], [address] Phone: [phone]   Fax:  [phone]   "

## 2024-02-01 ENCOUNTER — Ambulatory Visit

## 2024-02-02 ENCOUNTER — Encounter: Payer: Self-pay | Admitting: Physical Therapy

## 2024-02-02 ENCOUNTER — Ambulatory Visit: Payer: Self-pay | Attending: Orthopedic Surgery | Admitting: Physical Therapy

## 2024-02-02 DIAGNOSIS — M5459 Other low back pain: Secondary | ICD-10-CM | POA: Insufficient documentation

## 2024-02-02 DIAGNOSIS — M6281 Muscle weakness (generalized): Secondary | ICD-10-CM | POA: Insufficient documentation

## 2024-02-02 NOTE — Therapy (Signed)
 " OUTPATIENT PHYSICAL THERAPY CERVICAL & LUMBAR TREATMENT   Patient Name: Karen [name] MRN: [id number] DOB:1970-08-19, 54 y.o., female Today's Date: 02/02/2024  END OF SESSION:  PT End of Session - 02/02/24 0908     Visit Number 29    Date for Recertification  02/19/24    Authorization Type [employer]    PT Start Time [phone]   late   PT Stop Time 0930    PT Time Calculation (min) 24 min    Activity Tolerance Patient tolerated treatment well                       Past Medical History:  Diagnosis Date   Adenomyosis    Anemia    Arthritis 2013   L knee gets steroid injections    Arthritis, rheumatic, acute or subacute    Asthmatic bronchitis 01/31/2017   Back pain    Chronic constipation    Diarrhea    DVT of lower extremity (deep venous thrombosis) (HCC)    Dysmenorrhea    Endometriosis    Esophageal stricture    G6PD deficiency    GERD (gastroesophageal reflux disease)    History of kidney stones    History of uterine fibroid    IBS (irritable bowel syndrome)    Interstitial cystitis    Lactose intolerance    Migraine    with aura   Other hemochromatosis 06/10/2021   PONV (postoperative nausea and vomiting)    Recurrent upper respiratory infection (URI)    Sebaceous cyst of breast, right lower inner quadrant 10/25/2012   Excised 11/21/12    Sleep apnea    Syncope, non cardiac    Past Surgical History:  Procedure Laterality Date   ARTHROSCOPIC HAGLUNDS REPAIR     BREAST CYST EXCISION Right 11/21/2012   Procedure: CYST EXCISION BREAST;  Surgeon: Sherlean JINNY Laughter, MD;  Location: [facility];  Service: General;  Laterality: Right;   BREAST CYST EXCISION Bilateral 01/18/2022   Procedure: EXCISION OF SEBACEOUS CYST BILATERAL BREAST;  Surgeon: Vanderbilt Ned, MD;  Location: [facility];  Service: General;  Laterality: Bilateral;   BREAST EXCISIONAL BIOPSY Right 11/2012   CESAREAN SECTION  04/19/1993   CHOLECYSTECTOMY      COLONOSCOPY  05/02/2011   Procedure: COLONOSCOPY;  Surgeon: Lynwood LITTIE Celestia Mickey., MD;  Location: [facility];  Service: Endoscopy;  Laterality: N/A;   colonoscopy  11/04/2014   DENTAL SURGERY  03/06/2018   2 surgeries ( 03/06/2018 and 04/06/2018)   to remove two separate benign tumors   ESOPHAGEAL MANOMETRY N/A 11/04/2015   Procedure: ESOPHAGEAL MANOMETRY (EM);  Surgeon: Lynwood Celestia, MD;  Location: [facility];  Service: Endoscopy;  Laterality: N/A;   ESOPHAGOGASTRODUODENOSCOPY  05/02/2011   Procedure: ESOPHAGOGASTRODUODENOSCOPY (EGD);  Surgeon: Lynwood LITTIE Celestia Mickey., MD;  Location: [facility];  Service: Endoscopy;  Laterality: N/A;   ESOPHAGOGASTRODUODENOSCOPY N/A 09/01/2014   Procedure: ESOPHAGOGASTRODUODENOSCOPY (EGD);  Surgeon: Lynwood Celestia, MD;  Location: [facility];  Service: Endoscopy;  Laterality: N/A;   IR CV LINE INJECTION  09/13/2023   IR CV LINE INJECTION  12/01/2023   IR IMAGING GUIDED PORT INSERTION  07/06/2023   IR IMAGING GUIDED PORT INSERTION  12/01/2023   IR REMOVAL TUN ACCESS W/ PORT W/O FL MOD SED  12/01/2023   KNEE SURGERY     LAPAROSCOPY     x 4   LESION REMOVAL N/A 01/18/2022   Procedure: EXCISION OF SEBACEOUS CYSTS CHEST AND NECK;  Surgeon: Vanderbilt Ned, MD;  Location: [facility];  Service: General;  Laterality: N/A;   PH IMPEDANCE STUDY N/A 11/04/2015   Procedure: PH IMPEDANCE STUDY;  Surgeon: Lynwood Bohr, MD;  Location: [facility];  Service: Endoscopy;  Laterality: N/A;   VAGINAL HYSTERECTOMY  2010   TVH--ovaries remain   Patient Active Problem List   Diagnosis Date Noted   Sjogren syndrome with keratoconjunctivitis 12/09/2022   Hemochromatosis 06/10/2021   Elevated LFTs 04/04/2021   Colitis 04/04/2021   Bacterial overgrowth syndrome 05/21/2020   Obesity 05/21/2020   History of endometriosis 05/21/2020   Constipation due to outlet dysfunction 02/05/2020   Gastroesophageal reflux disease 02/05/2020   Obstructive sleep apnea treated  with continuous positive airway pressure (CPAP) 06/16/2017   Perennial allergic rhinitis with a nonallergic component 01/31/2017   Asthmatic bronchitis 01/31/2017   GI symptoms 01/31/2017   Family history of colon cancer 01/27/2015   Chest pain 12/13/2012   Dyspnea 12/13/2012   DVT of lower extremity (deep venous thrombosis) (HCC) 01/03/1990    PCP: Elliot Charm, MD   REFERRING PROVIDER:   Duwayne Purchase, MD    REFERRING DIAG:  Diagnosis  M50.30 (ICD-10-CM) - Other cervical disc degeneration, unspecified cervical region  Aqua therapy Evaluate and treat cervical and lumbar spine 2-3wk and 3-4 weeks DDD (cervical/lumbar)   THERAPY DIAG:  Muscle weakness (generalized)  Other low back pain  Rationale for Evaluation and Treatment: Rehabilitation  ONSET DATE: July 2025  SUBJECTIVE:                                                                                                                                                                                                         SUBJECTIVE STATEMENT: Running late to appt today with some stomach issues.  I really like the pool.  With the exercise my gut is really moving well!   I'm anemic and tired.      Eval: Reports onset of L UT pain following port placement into R shoulder Hand dominance: Right  PERTINENT HISTORY:  07/21/2023 Assessment: - suspect left-sided cervical/ trapezius muscle spasm - reassured by absence of red flag signs/ symptoms at this time   Concurrent lymphedema PAIN:  Are you having pain? Yes: NPRS scale: 2/10 Pain location: L UT Pain description: ache, cramp Aggravating factors: activity Relieving factors: heat, medication  PRECAUTIONS: None  RED FLAGS: None     WEIGHT BEARING RESTRICTIONS: No  FALLS:  Has patient fallen in last 6 months? No  OCCUPATION: [employer] Employee  PLOF: Independent  PATIENT GOALS: To  manage my neck pain  NEXT MD VISIT: TBD  OBJECTIVE:  Note:  Objective measures were completed at Evaluation unless otherwise noted.  DIAGNOSTIC FINDINGS:  Oct 2025 C3-4 mod bulging disc osteophyte C4-5 mod to severe bulging disc osteophyte C5-6 severe bulging osteophyte C6-7 minimal retrolisthesis of c6 and C7 with severe bulging disc T2-3mod left disc protrusion  subarticular  12/2 MRI lumbar & cervical  MRI cervical spine demonstrates C6-C7 moderately severe left and moderate right foraminal stenosis. Endplate Modic changes are noted. T2-T3 partially visualized moderate left subarticular disc protrusion with impingement upon and probable minimal deformity of the left lateral cord. Likely impinging on the left T3 nerve root.  X-rays of the cervical spine demonstrate cervical spondylosis predominantly at C4-5 C5-6 and C6-7. No subluxation at C1-C2.   X-rays lumbar spine demonstrates a mild dextrorotatory scoliosis. There is disc degeneration L5-S1 remainder obscured by barium.   MRI lumbar spine degenerative spinal listhesis L4-5 with severe facet arthrosis bulging disc mild stenosis. Mild degenerative changes small disc at L5-S1 and L3-4.    PATIENT SURVEYS:  NDI: 28/50 56% perceived disability  Minimum Detectable Change (90% confidence): 5 points or 10% points  10/03/23:  NDI  30%   The Patient-Specific Functional Scale  Initial:  I am going to ask you to identify up to 3 important activities that you are unable to do or are having difficulty with as a result of this problem.  Today are there any activities that you are unable to do or having difficulty with because of this?  (Patient shown scale and patient rated each activity)  Follow up: When you first came in you had difficulty performing these activities.  Today do you still have difficulty?  Patient-Specific activity scoring scheme (Point to one number):  0 1 2 3 4 5 6 7 8 9  10 Unable                                                                                                           Able to perform To perform                                                                                                    activity at the same Activity         Level as before  Injury or problem  Activity       Computer work 1 hour                                     9/30: 5                     11/12  5       12/22:6  2.          Cooking                                                           9/30: 5                  12/22:  6                                                                              3.         Sit and watch TV                                              9/30: 5          12/22:6                                                              12/25/2023 ODI:15/50 30% NDI:21/50 42%  POSTURE: rounded shoulders and forward head  PALPATION: Global tenderness throughout B UT and posterior shoulder girdle   CERVICAL ROM:   Active ROM A/PROM (deg) eval 9/30 11/12 12/22  Flexion 75%P! 47 50 30*  Extension 75%P! 45 46 32*  Right lateral flexion 75%P! 36 43 30  Left lateral flexion 75%P! 29 31 30   Right rotation 75%P! 35 40 60  Left rotation 75%P! 29 22 50   (Blank rows = not tested)  LUMBAR ROM: *painful  Active ROM 12/22  Flexion Bend to shins*  Extension 80% limited*  Right lateral flexion Bends to above knee joint*  Left lateral flexion Bends past knee joint*  Right rotation Full*  Left rotation Full*   (Blank rows = not tested)   UPPER EXTREMITY ROM:  Active ROM Right eval Left eval 9/30 11/12  Shoulder flexion 150 150 L 155  R 151   Shoulder extension      Shoulder abduction 150 150 L 167 R 161 R 150 L 155  Shoulder adduction      Shoulder extension      Shoulder internal rotation      Shoulder external rotation      Elbow flexion  Elbow extension      Wrist flexion      Wrist extension      Wrist ulnar  deviation      Wrist radial deviation      Wrist pronation      Wrist supination       (Blank rows = not tested)  UPPER EXTREMITY MMT:  MMT Right eval Left eval 9/30 11/12  Shoulder flexion      Shoulder extension      Shoulder abduction      Shoulder adduction      Shoulder extension      Shoulder internal rotation      Shoulder external rotation      Middle trapezius   Bil 4-/5 Bil 4/5  Lower trapezius   Bil 4-/5 Bil 4/5  Elbow flexion      Elbow extension      Wrist flexion      Wrist extension      Wrist ulnar deviation      Wrist radial deviation      Wrist pronation      Wrist supination      Grip strength       (Blank rows = not tested)  LOWER  EXTREMITY MMT: Mu:Hmnddob 4-/4+  Lt: hip flexion & knee extension 4-; all other motions grossly 4/5  CERVICAL SPECIAL TESTS:  Neck flexor muscle endurance test: Negative 9/30 decreased cervical extensor strength 4-/5 11/12: deep cervical flexors and exensors 4/5  FUNCTIONAL TESTS:  5STS:17.31 sec hands on knees (uncomfortable, weakness on Lt )   TREATMENT  02/02/2024: Review of current status and response to 4 visits of aquatic PT Discussed plan for PT to continue aquatic therapy to establish a program for back and neck mobility and core strength Manual:  grade 2 cervical distraction 3x 10; gentle upper trap lengthening right/left; suboccipital release; rotation mobs C2-4, lateral side glides C4-7 right/left grade 2/3; upper cervical flexion mob grade 2/3 10x    01/24/24:Pt seen for aquatic therapy today.  Treatment took place in water 3.5-4.75 ft in depth at the [facility] pool. Temp of water was 91.  Pt entered/exited the pool via stairs independently with bil rail. Pt requires buoyancy of water for support and to offload joints with strengthening exercises.  Seated water bench with 75% submersion Pt performed seated LE AROM exercises 20x in all planes, concurrent discussion of her status.   *75% depth  water walking 8x each direction. Added arm push/pull with forward walking using rainbow floats * High knee slow march 4 lengths * UE On wall: heel/toe raises; hip abdct/ add, circumduction, and flex/ext: 20x each Bil * TrA set with 1/2 hollow blue noodle pull down to thighs 10x *decompression hang in deep end 1 min focused breath f/b 5 PF/TA holding 8 sec. Rest 1 min and repeat another set of 5 PF/TA contractions with exhale holding 8 sec.   Pt requires the buoyancy and hydrostatic pressure of water for support, and to offload joints by unweighting joint load by at least 50 % in navel deep water and by at least 75-80% in chest to neck deep water.  Viscosity of the water is needed for resistance of strengthening. Water current perturbations provides challenge to standing balance requiring increased core activation.   01/16/24 Pt seen for aquatic therapy today.  Treatment took place in water 3.5-4.75 ft in depth at the [facility] pool. Temp of water was 91.  Pt entered/exited the pool via stairs independently  with bil rail. Pt requires buoyancy of water for support and to offload joints with strengthening exercises.  Seated water bench with 75% submersion Pt performed seated LE AROM exercises 20x in all planes, concurrent discussion of her status.   *75% depth water walking 6x each direction. VC for whatever UE felt good to her. * High knee slow march 4 lengths * UE On wall: heel/toe raises; hip abdct/ add, circumduction 15x eac2x5 * TrA set with 1/2 hollow blue noodle pull down to thighs  *decompression hang in deep end 1 min focused breath f/b 5 PF/TA contractions. Rest 1 min and repeat another set of 5 PF/TA contractions with exhale.   Pt requires the buoyancy and hydrostatic pressure of water for support, and to offload joints by unweighting joint load by at least 50 % in navel deep water and by at least 75-80% in chest to neck deep water.  Viscosity of the water is needed for  resistance of strengthening. Water current perturbations provides challenge to standing balance requiring increased core activation.   01/10/24:Pt arrives for aquatic physical therapy. Treatment took place in 3.5-5.5 feet of water. Water temperature was 91 degrees F. Pt entered the pool via stairs independently using rails. Seated water bench with 75% submersion Pt performed seated LE AROM exercises 20x in all planes, with concurrent discussion of current status. -75% depth water walking 4x each holding the Q-tip for back support.VC for proper heel toe walking as pt was ambulating on her toes with forward walking. -decompression hang 3 min with xtra large white noodle -standing post pelvic tilt  against the wall 3 sec hold 6x, Vc to move/tilt from abdomin -Hip abd 10x Bil -Standing marching 10x B  12/25/2023 Lumbar Assessment, NDI,ODI,PSFS (see above) Goal Assessment and plan for the next 8 weeks    12/20/2023 Patient reports she has been implementing hip hinge with function Plan to do ERO next visit (pt has new referral from Dr. Duwayne to include Low back pain and initiation of aquatic PT) HEP update with areas of focus Cervical ROM in all planes 3-5 reps each (Added to HEP- see below) Shoulder supine flexion 3-5 reps (Added to HEP- see below) Seated or standing shoulder abduction 3-5 reps (Added to HEP- see below) Manual:  grade 2 cervical distraction 3x 10; gentle upper trap lengthening right/left; suboccipital release; rotation mobs C2-4, lateral side glides C4-7 right/left grade 2/3; upper cervical flexion mob grade 2/3 10x  12/13/2023 Discussion of status including new port an allergic reaction Plan to do ERO 12/23 (pt has new referral from Dr. Duwayne to include Low back pain and initiation of aquatic PT) Body mechanics with standing: foot prop  Hip hinge with dowel and 3 points of contact and neutral spine alignment10x Hip hinge for functional movement to retrieve pots/pans and pick  up objects from the floor Manual:  grade 2 cervical distraction 3x 10; gentle upper trap lengthening right/left; suboccipital release; rotation mobs C2-4, lateral side glides C4-7 right/left grade 2/3; upper cervical flexion mob grade 2/3 10x   PATIENT EDUCATION:  Education details: Discussed eval findings, rehab rationale and POC and patient is in agreement  Person educated: Patient Education method: Explanation and Handouts Education comprehension: verbalized understanding and needs further education  HOME EXERCISE PROGRAM: Access Code: [email or web address] URL: [email or web address] Date: 12/20/2023 Prepared by: Glade Pesa  Exercises - Seated Shoulder Shrugs  - 2 x daily - 5 x weekly - 2 sets - 10 reps - Seated Scapular Retraction  - 2 x  daily - 5 x weekly - 2 sets - 10 reps - Gentle Levator Scapulae Stretch  - 1 x daily - 7 x weekly - 1 sets - 2 reps - 30 seconds hold - Seated Cervical Sidebending Stretch  - 1 x daily - 7 x weekly - 1 sets - 2 reps - 30 seconds hold - Quadricep Stretch with Chair and Counter Support  - 1 x daily - 7 x weekly - 2 sets - 20-30s hold - Standing Hamstring Stretch with Step  - 1 x daily - 7 x weekly - 2 sets - 20-30s hold - Seated Hamstring Stretch  - 1 x daily - 7 x weekly - 2 sets - 20-30 hold - Seated Hip Flexor Stretch  - 1 x daily - 7 x weekly - 2 sets - 20-30 hold - Seated Scalene Stretch with Towel  - 1 x daily - 7 x weekly - 2 sets - 20 reps - Seated Modified Small Range Crunch in Chair  - 1 x daily - 7 x weekly - 1-2 sets - 10 reps - Standing Marching  - 1 x daily - 7 x weekly - 1 sets - 10 reps - Median Nerve Flossing - Tray  - 1 x daily - 7 x weekly - 1 sets - 10 reps - Seated Cervical Traction  - 1 x daily - 7 x weekly - 1 sets - 10 reps - Seated Neck and Traction  - 1 x daily - 7 x weekly - 1 sets - 10 reps - Standing Low Shoulder Row with Anchored Resistance  - 1 x daily - 7 x weekly - 1 sets - 10 reps - Seated Thoracic Flexion and  Rotation with Arms Crossed  - 1 x daily - 7 x weekly - 1 sets - 5 reps - Seated Thoracic Lumbar Extension  - 1 x daily - 7 x weekly - 1 sets - 5-10 reps - Supine Head Nod with Deep Neck Flexor Activation  - 1 x daily - 7 x weekly - 1 sets - 10 reps - Seated Deep Neck Flexor Nods  - 1 x daily - 7 x weekly - 1 sets - 10 reps - Standing Lumbar Extension at Wall - Forearms  - 1 x daily - 7 x weekly - 1 sets - 5-8 reps - 2s hold - Lying Prone  - 1 x daily - 7 x weekly - 3-51m hold - Lying Prone with 1 Pillow  - 1 x daily - 7 x weekly - 3-44m hold - Prone Press Up On Elbows  - 1 x daily - 7 x weekly - 1 sets - 5-8 reps - 3-5s hold - Seated Cervical Rotation AROM  - 1 x daily - 7 x weekly - 1 sets - 3-5 reps - Seated Cervical Flexion AROM  - 1 x daily - 7 x weekly - 1 sets - 3-5 reps - Seated Cervical Sidebending AROM  - 1 x daily - 7 x weekly - 1 sets - 3-5 reps - Seated Cervical Extension AROM  - 1 x daily - 7 x weekly - 1 sets - 3-5 reps - Supine Shoulder Flexion AAROM with Hands Clasped  - 1 x daily - 7 x weekly - 1 sets - 3-5 reps - Seated Shoulder Abduction and External Rotation AROM  - 1 x daily - 7 x weekly - 1 sets - 3-5 reps ASSESSMENT:  CLINICAL IMPRESSION: Limited treatment time due to late arrival.  Tanairy reports  a very positive response to initial visits of aquatic PT including benefit from ex's that focus on her core.  She would benefit from additional visits to establish an independent aquatic program.  Much improved soft tissue mobility noted in the cervical region with decreased overall tender points noted.  Therapist monitoring response to all interventions and modifying treatment accordingly.       OBJECTIVE IMPAIRMENTS: decreased activity tolerance, decreased mobility, decreased ROM, increased fascial restrictions, increased muscle spasms, impaired UE functional use, postural dysfunction, and pain.   ACTIVITY LIMITATIONS: carrying, lifting, bending, bed mobility, and reach over  head  PERSONAL FACTORS: Age, Fitness, Past/current experiences, and 1 comorbidity: lymphedema are also affecting patient's functional outcome.   REHAB POTENTIAL: Good  CLINICAL DECISION MAKING: Stable/uncomplicated  EVALUATION COMPLEXITY: Low   GOALS: Goals reviewed with patient? No   SHORT TERM GOALS=LONG TERM GOALS: Target date: 02/19/2024  Patient to demonstrate independence in advanced HEP  Baseline: [id number] Goal status: ongoing 12/25/2023  2.  Patient will acknowledge 4/10 pain at least once during episode of care   Baseline: 8/10 Goal status: MET 12/25/2023 (2/10)  3.  Patient will score at least 20/50 on NDI to signify clinically meaningful improvement in functional abilities.   Baseline: 28/50 Goal status:  met 9/30 (however not met 12/25/2023  4.  160d AROM in B shoulder flexion/abduction Baseline: 150d Goal status: ongoing  5.  Improved cervical rotation ROM to 45 degrees needed for driving revised Baseline:  Goal status: MET 12/25/2023  6. Patient will be able to walk at [facility] due to improved cardiovascular endurance and improved LE strength. Baseline:  Goal status: NEW (updated)  7. PSFS rating for computer work improved to 6-7  Baseline:  Goal status: MET (6) 12/25/2023  8. PSFS rating for cooking improved to 8 and able to cook 1 hour due to improved muscular endurance and decreased pain.  Baseline: 6 Goal status:NEW (updated )  9. Patient will demonstrate correct squatting, hinging, and lifting technique with no cues to prevent mechanical strain and injury.   Baseline: 6 Goal status:NEW (updated )   10. Patient will perform 5STS in < or = to 13 sec due to improved LE strength and functional mobility.  Baseline: 17.31 sec Goal status:NEW    PLAN:  PT FREQUENCY: 1-2x/week  PT DURATION: 8 weeks   PLANNED INTERVENTIONS: [id number]- PT Re-evaluation, 97110-Therapeutic exercises, 97530- Therapeutic activity, 97112- Neuromuscular  re-education, 97535- Self Care, [id number]- Manual therapy, [phone]- Gait training, [phone]- Canalith repositioning, [id number]- Aquatic Therapy, [phone]- Electrical stimulation (unattended), [phone]- Electrical stimulation (manual), [id number]- Vasopneumatic device, [id number]- Traction (mechanical), [id number]- Ionotophoresis 4mg /ml Dexamethasone , [id number] (1-2 muscles), [id number] (3+ muscles)- Dry Needling, Stair training, Taping, Joint mobilization, Joint manipulation, Spinal manipulation, Spinal mobilization, Vestibular training, Cryotherapy, and Moist heat  PLAN FOR NEXT SESSION aquatic PT for cervical and lumbar; core strengthening postural strengthening, shoulder ROM, manual therapy  Glade Pesa, PT 02/02/24 12:18 PM Phone: [phone] Fax: [phone]   "

## 2024-02-03 LAB — ALPHA-GAL PANEL
Allergen Lamb IgE: <0.1 kU/L
Beef IgE: <0.1 kU/L
IgE (Immunoglobulin E), Serum: 78 [IU]/mL (ref 6–495)
O215-IgE Alpha-Gal: <0.1 kU/L
Pork IgE: <0.1 kU/L

## 2024-02-03 LAB — SEDIMENTATION RATE: Sed Rate: 39 mm/h (ref 0–40)

## 2024-02-03 LAB — C-REACTIVE PROTEIN: CRP: 8 mg/L (ref 0–10)

## 2024-02-03 LAB — THYROID ANTIBODIES (THYROPEROXIDASE & THYROGLOBULIN)
Thyroglobulin Antibody: <1 [IU]/mL (ref 0.0–0.9)
Thyroperoxidase Ab SerPl-aCnc: 19 [IU]/mL (ref 0–34)

## 2024-02-03 LAB — CHRONIC URTICARIA PD-BAT

## 2024-02-03 LAB — TRYPTASE: Tryptase: 2.7 ug/L (ref 2.2–13.2)

## 2024-02-05 ENCOUNTER — Ambulatory Visit: Admitting: Occupational Therapy

## 2024-02-06 ENCOUNTER — Ambulatory Visit: Payer: Self-pay | Admitting: Allergy & Immunology

## 2024-02-06 ENCOUNTER — Ambulatory Visit: Admitting: Physical Therapy

## 2024-02-07 ENCOUNTER — Encounter (HOSPITAL_BASED_OUTPATIENT_CLINIC_OR_DEPARTMENT_OTHER): Admitting: Physical Therapy

## 2024-02-07 ENCOUNTER — Encounter (HOSPITAL_BASED_OUTPATIENT_CLINIC_OR_DEPARTMENT_OTHER): Payer: Self-pay | Admitting: Physical Therapy

## 2024-02-07 DIAGNOSIS — M6281 Muscle weakness (generalized): Secondary | ICD-10-CM

## 2024-02-07 DIAGNOSIS — M5459 Other low back pain: Secondary | ICD-10-CM

## 2024-02-07 NOTE — Therapy (Signed)
 " OUTPATIENT PHYSICAL THERAPY CERVICAL & LUMBAR TREATMENT   Patient Name: Karen [name] MRN: [id number] DOB:1970/09/03, 54 y.o., female Today's Date: 02/07/2024  END OF SESSION:  PT End of Session - 02/07/24 0907     Visit Number 30    Date for Recertification  02/19/24    Authorization Type [employer]    PT Start Time 0905    PT Stop Time 0943    PT Time Calculation (min) 38 min    Activity Tolerance Patient tolerated treatment well;Patient limited by pain    Behavior During Therapy [facility] for tasks assessed/performed                       Past Medical History:  Diagnosis Date   Adenomyosis    Anemia    Arthritis 2013   L knee gets steroid injections    Arthritis, rheumatic, acute or subacute    Asthmatic bronchitis 01/31/2017   Back pain    Chronic constipation    Diarrhea    DVT of lower extremity (deep venous thrombosis) (HCC)    Dysmenorrhea    Endometriosis    Esophageal stricture    G6PD deficiency    GERD (gastroesophageal reflux disease)    History of kidney stones    History of uterine fibroid    IBS (irritable bowel syndrome)    Interstitial cystitis    Lactose intolerance    Migraine    with aura   Other hemochromatosis 06/10/2021   PONV (postoperative nausea and vomiting)    Recurrent upper respiratory infection (URI)    Sebaceous cyst of breast, right lower inner quadrant 10/25/2012   Excised 11/21/12    Sleep apnea    Syncope, non cardiac    Past Surgical History:  Procedure Laterality Date   ARTHROSCOPIC HAGLUNDS REPAIR     BREAST CYST EXCISION Right 11/21/2012   Procedure: CYST EXCISION BREAST;  Surgeon: Sherlean JINNY Laughter, MD;  Location: [facility];  Service: General;  Laterality: Right;   BREAST CYST EXCISION Bilateral 01/18/2022   Procedure: EXCISION OF SEBACEOUS CYST BILATERAL BREAST;  Surgeon: Vanderbilt Ned, MD;  Location: [facility];  Service: General;  Laterality: Bilateral;   BREAST EXCISIONAL  BIOPSY Right 11/2012   CESAREAN SECTION  04/19/1993   CHOLECYSTECTOMY     COLONOSCOPY  05/02/2011   Procedure: COLONOSCOPY;  Surgeon: Lynwood LITTIE Celestia Mickey., MD;  Location: [facility];  Service: Endoscopy;  Laterality: N/A;   colonoscopy  11/04/2014   DENTAL SURGERY  03/06/2018   2 surgeries ( 03/06/2018 and 04/06/2018)   to remove two separate benign tumors   ESOPHAGEAL MANOMETRY N/A 11/04/2015   Procedure: ESOPHAGEAL MANOMETRY (EM);  Surgeon: Lynwood Celestia, MD;  Location: [facility];  Service: Endoscopy;  Laterality: N/A;   ESOPHAGOGASTRODUODENOSCOPY  05/02/2011   Procedure: ESOPHAGOGASTRODUODENOSCOPY (EGD);  Surgeon: Lynwood LITTIE Celestia Mickey., MD;  Location: [facility];  Service: Endoscopy;  Laterality: N/A;   ESOPHAGOGASTRODUODENOSCOPY N/A 09/01/2014   Procedure: ESOPHAGOGASTRODUODENOSCOPY (EGD);  Surgeon: Lynwood Celestia, MD;  Location: [facility];  Service: Endoscopy;  Laterality: N/A;   IR CV LINE INJECTION  09/13/2023   IR CV LINE INJECTION  12/01/2023   IR IMAGING GUIDED PORT INSERTION  07/06/2023   IR IMAGING GUIDED PORT INSERTION  12/01/2023   IR REMOVAL TUN ACCESS W/ PORT W/O FL MOD SED  12/01/2023   KNEE SURGERY     LAPAROSCOPY     x 4   LESION REMOVAL N/A 01/18/2022  Procedure: EXCISION OF SEBACEOUS CYSTS CHEST AND NECK;  Surgeon: Vanderbilt Ned, MD;  Location: [facility];  Service: General;  Laterality: N/A;   PH IMPEDANCE STUDY N/A 11/04/2015   Procedure: PH IMPEDANCE STUDY;  Surgeon: Lynwood Bohr, MD;  Location: [facility];  Service: Endoscopy;  Laterality: N/A;   VAGINAL HYSTERECTOMY  2010   TVH--ovaries remain   Patient Active Problem List   Diagnosis Date Noted   Sjogren syndrome with keratoconjunctivitis 12/09/2022   Hemochromatosis 06/10/2021   Elevated LFTs 04/04/2021   Colitis 04/04/2021   Bacterial overgrowth syndrome 05/21/2020   Obesity 05/21/2020   History of endometriosis 05/21/2020   Constipation due to outlet dysfunction 02/05/2020    Gastroesophageal reflux disease 02/05/2020   Obstructive sleep apnea treated with continuous positive airway pressure (CPAP) 06/16/2017   Perennial allergic rhinitis with a nonallergic component 01/31/2017   Asthmatic bronchitis 01/31/2017   GI symptoms 01/31/2017   Family history of colon cancer 01/27/2015   Chest pain 12/13/2012   Dyspnea 12/13/2012   DVT of lower extremity (deep venous thrombosis) (HCC) 01/03/1990    PCP: Elliot Charm, MD   REFERRING PROVIDER:   Duwayne Purchase, MD    REFERRING DIAG:  Diagnosis  M50.30 (ICD-10-CM) - Other cervical disc degeneration, unspecified cervical region  Aqua therapy Evaluate and treat cervical and lumbar spine 2-3wk and 3-4 weeks DDD (cervical/lumbar)   THERAPY DIAG:  Muscle weakness (generalized)  Other low back pain  Rationale for Evaluation and Treatment: Rehabilitation  ONSET DATE: July 2025  SUBJECTIVE:                                                                                                                                                                                                         SUBJECTIVE STATEMENT: Running late again rough morning.  LBP 4/10. Did a lot of bending yesterday Hemoglobin low see hematologist soon     Eval: Reports onset of L UT pain following port placement into R shoulder Hand dominance: Right  PERTINENT HISTORY:  07/21/2023 Assessment: - suspect left-sided cervical/ trapezius muscle spasm - reassured by absence of red flag signs/ symptoms at this time   Concurrent lymphedema PAIN:  Are you having pain? Yes: NPRS scale: 2/10 Pain location: L UT Pain description: ache, cramp Aggravating factors: activity Relieving factors: heat, medication  PRECAUTIONS: None  RED FLAGS: None     WEIGHT BEARING RESTRICTIONS: No  FALLS:  Has patient fallen in last 6 months? No  OCCUPATION: [employer] Employee  PLOF: Independent  PATIENT GOALS: To manage my neck  pain  NEXT MD VISIT: TBD  OBJECTIVE:  Note: Objective measures were completed at Evaluation unless otherwise noted.  DIAGNOSTIC FINDINGS:  Oct 2025 C3-4 mod bulging disc osteophyte C4-5 mod to severe bulging disc osteophyte C5-6 severe bulging osteophyte C6-7 minimal retrolisthesis of c6 and C7 with severe bulging disc T2-30mod left disc protrusion  subarticular  12/2 MRI lumbar & cervical  MRI cervical spine demonstrates C6-C7 moderately severe left and moderate right foraminal stenosis. Endplate Modic changes are noted. T2-T3 partially visualized moderate left subarticular disc protrusion with impingement upon and probable minimal deformity of the left lateral cord. Likely impinging on the left T3 nerve root.  X-rays of the cervical spine demonstrate cervical spondylosis predominantly at C4-5 C5-6 and C6-7. No subluxation at C1-C2.   X-rays lumbar spine demonstrates a mild dextrorotatory scoliosis. There is disc degeneration L5-S1 remainder obscured by barium.   MRI lumbar spine degenerative spinal listhesis L4-5 with severe facet arthrosis bulging disc mild stenosis. Mild degenerative changes small disc at L5-S1 and L3-4.    PATIENT SURVEYS:  NDI: 28/50 56% perceived disability  Minimum Detectable Change (90% confidence): 5 points or 10% points  10/03/23:  NDI  30%   The Patient-Specific Functional Scale  Initial:  I am going to ask you to identify up to 3 important activities that you are unable to do or are having difficulty with as a result of this problem.  Today are there any activities that you are unable to do or having difficulty with because of this?  (Patient shown scale and patient rated each activity)  Follow up: When you first came in you had difficulty performing these activities.  Today do you still have difficulty?  Patient-Specific activity scoring scheme (Point to one number):  0 1 2 3 4 5 6 7 8 9  10 Unable                                                                                                           Able to perform To perform                                                                                                    activity at the same Activity         Level as before  Injury or problem  Activity       Computer work 1 hour                                     9/30: 5                     11/12  5       12/22:6  2.          Cooking                                                           9/30: 5                  12/22:  6                                                                              3.         Sit and watch TV                                              9/30: 5          12/22:6                                                              12/25/2023 ODI:15/50 30% NDI:21/50 42%  POSTURE: rounded shoulders and forward head  PALPATION: Global tenderness throughout B UT and posterior shoulder girdle   CERVICAL ROM:   Active ROM A/PROM (deg) eval 9/30 11/12 12/22  Flexion 75%P! 47 50 30*  Extension 75%P! 45 46 32*  Right lateral flexion 75%P! 36 43 30  Left lateral flexion 75%P! 29 31 30   Right rotation 75%P! 35 40 60  Left rotation 75%P! 29 22 50   (Blank rows = not tested)  LUMBAR ROM: *painful  Active ROM 12/22  Flexion Bend to shins*  Extension 80% limited*  Right lateral flexion Bends to above knee joint*  Left lateral flexion Bends past knee joint*  Right rotation Full*  Left rotation Full*   (Blank rows = not tested)   UPPER EXTREMITY ROM:  Active ROM Right eval Left eval 9/30 11/12  Shoulder flexion 150 150 L 155  R 151   Shoulder extension      Shoulder abduction 150 150 L 167 R 161 R 150 L 155  Shoulder adduction      Shoulder extension      Shoulder internal rotation      Shoulder external rotation      Elbow flexion  Elbow extension      Wrist  flexion      Wrist extension      Wrist ulnar deviation      Wrist radial deviation      Wrist pronation      Wrist supination       (Blank rows = not tested)  UPPER EXTREMITY MMT:  MMT Right eval Left eval 9/30 11/12  Shoulder flexion      Shoulder extension      Shoulder abduction      Shoulder adduction      Shoulder extension      Shoulder internal rotation      Shoulder external rotation      Middle trapezius   Bil 4-/5 Bil 4/5  Lower trapezius   Bil 4-/5 Bil 4/5  Elbow flexion      Elbow extension      Wrist flexion      Wrist extension      Wrist ulnar deviation      Wrist radial deviation      Wrist pronation      Wrist supination      Grip strength       (Blank rows = not tested)  LOWER  EXTREMITY MMT: Mu:Hmnddob 4-/4+  Lt: hip flexion & knee extension 4-; all other motions grossly 4/5  CERVICAL SPECIAL TESTS:  Neck flexor muscle endurance test: Negative 9/30 decreased cervical extensor strength 4-/5 11/12: deep cervical flexors and exensors 4/5  FUNCTIONAL TESTS:  5STS:17.31 sec hands on knees (uncomfortable, weakness on Lt )   TREATMENT  DATE: 02/07/24 Pt seen for aquatic therapy today.  Treatment took place in water 3.5-4.75 ft in depth at the [facility] pool. Temp of water was 91.  Pt entered/exited the pool via stairs using alternating pattern with hand rail.    *walking forward, back and side stepping in 3.6- 4.0 ft with unsupported *side stepping ue add/abd RBHB. VC and demonstration for abd bracing with movement *[employer] [place] *TrA engagement 1/2 noodle pull down wide stance then staggered x 10.  - squatted rest period *L stretch CGA and demonstration *self massage with ball against wall lumbar spine *decompression position with yellow noodle wrapped anteriorly across chest: cycling; hip add/abd; flex/ext   Pt requires the buoyancy and hydrostatic pressure of water for support, and to offload joints by unweighting joint load by at  least 50 % in navel deep water and by at least 75-80% in chest to neck deep water.  Viscosity of the water is needed for resistance of strengthening. Water current perturbations provides challenge to standing balance requiring increased core activation.   02/02/2024: Review of current status and response to 4 visits of aquatic PT Discussed plan for PT to continue aquatic therapy to establish a program for back and neck mobility and core strength Manual:  grade 2 cervical distraction 3x 10; gentle upper trap lengthening right/left; suboccipital release; rotation mobs C2-4, lateral side glides C4-7 right/left grade 2/3; upper cervical flexion mob grade 2/3 10x    01/24/24:Pt seen for aquatic therapy today.  Treatment took place in water 3.5-4.75 ft in depth at the [facility] pool. Temp of water was 91.  Pt entered/exited the pool via stairs independently with bil rail. Pt requires buoyancy of water for support and to offload joints with strengthening exercises.  Seated water bench with 75% submersion Pt performed seated LE AROM exercises 20x in all planes, concurrent discussion of her status.   *75% depth  water walking 8x each direction. Added arm push/pull with forward walking using rainbow floats * High knee slow march 4 lengths * UE On wall: heel/toe raises; hip abdct/ add, circumduction, and flex/ext: 20x each Bil * TrA set with 1/2 hollow blue noodle pull down to thighs 10x *decompression hang in deep end 1 min focused breath f/b 5 PF/TA holding 8 sec. Rest 1 min and repeat another set of 5 PF/TA contractions with exhale holding 8 sec.   Pt requires the buoyancy and hydrostatic pressure of water for support, and to offload joints by unweighting joint load by at least 50 % in navel deep water and by at least 75-80% in chest to neck deep water.  Viscosity of the water is needed for resistance of strengthening. Water current perturbations provides challenge to standing balance requiring  increased core activation.   01/16/24 Pt seen for aquatic therapy today.  Treatment took place in water 3.5-4.75 ft in depth at the [facility] pool. Temp of water was 91.  Pt entered/exited the pool via stairs independently with bil rail. Pt requires buoyancy of water for support and to offload joints with strengthening exercises.  Seated water bench with 75% submersion Pt performed seated LE AROM exercises 20x in all planes, concurrent discussion of her status.   *75% depth water walking 6x each direction. VC for whatever UE felt good to her. * High knee slow march 4 lengths * UE On wall: heel/toe raises; hip abdct/ add, circumduction 15x eac2x5 * TrA set with 1/2 hollow blue noodle pull down to thighs  *decompression hang in deep end 1 min focused breath f/b 5 PF/TA contractions. Rest 1 min and repeat another set of 5 PF/TA contractions with exhale.   Pt requires the buoyancy and hydrostatic pressure of water for support, and to offload joints by unweighting joint load by at least 50 % in navel deep water and by at least 75-80% in chest to neck deep water.  Viscosity of the water is needed for resistance of strengthening. Water current perturbations provides challenge to standing balance requiring increased core activation.   01/10/24:Pt arrives for aquatic physical therapy. Treatment took place in 3.5-5.5 feet of water. Water temperature was 91 degrees F. Pt entered the pool via stairs independently using rails. Seated water bench with 75% submersion Pt performed seated LE AROM exercises 20x in all planes, with concurrent discussion of current status. -75% depth water walking 4x each holding the Q-tip for back support.VC for proper heel toe walking as pt was ambulating on her toes with forward walking. -decompression hang 3 min with xtra large white noodle -standing post pelvic tilt  against the wall 3 sec hold 6x, Vc to move/tilt from abdomin -Hip abd 10x Bil -Standing marching 10x  B  12/25/2023 Lumbar Assessment, NDI,ODI,PSFS (see above) Goal Assessment and plan for the next 8 weeks    12/20/2023 Patient reports she has been implementing hip hinge with function Plan to do ERO next visit (pt has new referral from Dr. Duwayne to include Low back pain and initiation of aquatic PT) HEP update with areas of focus Cervical ROM in all planes 3-5 reps each (Added to HEP- see below) Shoulder supine flexion 3-5 reps (Added to HEP- see below) Seated or standing shoulder abduction 3-5 reps (Added to HEP- see below) Manual:  grade 2 cervical distraction 3x 10; gentle upper trap lengthening right/left; suboccipital release; rotation mobs C2-4, lateral side glides C4-7 right/left grade 2/3; upper cervical flexion mob grade 2/3  10x  12/13/2023 Discussion of status including new port an allergic reaction Plan to do ERO 12/23 (pt has new referral from Dr. Duwayne to include Low back pain and initiation of aquatic PT) Body mechanics with standing: foot prop  Hip hinge with dowel and 3 points of contact and neutral spine alignment10x Hip hinge for functional movement to retrieve pots/pans and pick up objects from the floor Manual:  grade 2 cervical distraction 3x 10; gentle upper trap lengthening right/left; suboccipital release; rotation mobs C2-4, lateral side glides C4-7 right/left grade 2/3; upper cervical flexion mob grade 2/3 10x   PATIENT EDUCATION:  Education details: Discussed eval findings, rehab rationale and POC and patient is in agreement  Person educated: Patient Education method: Explanation and Handouts Education comprehension: verbalized understanding and needs further education  HOME EXERCISE PROGRAM: Access Code: [email or web address] URL: [email or web address] Date: 12/20/2023 Prepared by: Glade Pesa  Exercises - Seated Shoulder Shrugs  - 2 x daily - 5 x weekly - 2 sets - 10 reps - Seated Scapular Retraction  - 2 x daily - 5 x weekly - 2 sets - 10  reps - Gentle Levator Scapulae Stretch  - 1 x daily - 7 x weekly - 1 sets - 2 reps - 30 seconds hold - Seated Cervical Sidebending Stretch  - 1 x daily - 7 x weekly - 1 sets - 2 reps - 30 seconds hold - Quadricep Stretch with Chair and Counter Support  - 1 x daily - 7 x weekly - 2 sets - 20-30s hold - Standing Hamstring Stretch with Step  - 1 x daily - 7 x weekly - 2 sets - 20-30s hold - Seated Hamstring Stretch  - 1 x daily - 7 x weekly - 2 sets - 20-30 hold - Seated Hip Flexor Stretch  - 1 x daily - 7 x weekly - 2 sets - 20-30 hold - Seated Scalene Stretch with Towel  - 1 x daily - 7 x weekly - 2 sets - 20 reps - Seated Modified Small Range Crunch in Chair  - 1 x daily - 7 x weekly - 1-2 sets - 10 reps - Standing Marching  - 1 x daily - 7 x weekly - 1 sets - 10 reps - Median Nerve Flossing - Tray  - 1 x daily - 7 x weekly - 1 sets - 10 reps - Seated Cervical Traction  - 1 x daily - 7 x weekly - 1 sets - 10 reps - Seated Neck and Traction  - 1 x daily - 7 x weekly - 1 sets - 10 reps - Standing Low Shoulder Row with Anchored Resistance  - 1 x daily - 7 x weekly - 1 sets - 10 reps - Seated Thoracic Flexion and Rotation with Arms Crossed  - 1 x daily - 7 x weekly - 1 sets - 5 reps - Seated Thoracic Lumbar Extension  - 1 x daily - 7 x weekly - 1 sets - 5-10 reps - Supine Head Nod with Deep Neck Flexor Activation  - 1 x daily - 7 x weekly - 1 sets - 10 reps - Seated Deep Neck Flexor Nods  - 1 x daily - 7 x weekly - 1 sets - 10 reps - Standing Lumbar Extension at Wall - Forearms  - 1 x daily - 7 x weekly - 1 sets - 5-8 reps - 2s hold - Lying Prone  - 1 x daily - 7 x weekly - 3-59m hold -  Lying Prone with 1 Pillow  - 1 x daily - 7 x weekly - 3-85m hold - Prone Press Up On Elbows  - 1 x daily - 7 x weekly - 1 sets - 5-8 reps - 3-5s hold - Seated Cervical Rotation AROM  - 1 x daily - 7 x weekly - 1 sets - 3-5 reps - Seated Cervical Flexion AROM  - 1 x daily - 7 x weekly - 1 sets - 3-5 reps - Seated  Cervical Sidebending AROM  - 1 x daily - 7 x weekly - 1 sets - 3-5 reps - Seated Cervical Extension AROM  - 1 x daily - 7 x weekly - 1 sets - 3-5 reps - Supine Shoulder Flexion AAROM with Hands Clasped  - 1 x daily - 7 x weekly - 1 sets - 3-5 reps - Seated Shoulder Abduction and External Rotation AROM  - 1 x daily - 7 x weekly - 1 sets - 3-5 reps ASSESSMENT:  CLINICAL IMPRESSION: Focus on core strengthening. Instruction on abd bracing throughout session to increase engagement of core. Frequent cuing provided for pacing of activity and form. Used ball for self massage/pressure point along area LB that pt reports feeling a knot with good response.  Pt received list of pools in area. Edu on aquatic programs she may be interested in looking into going forward as well as being assigned an aquatic HEP if pool access gained.       OBJECTIVE IMPAIRMENTS: decreased activity tolerance, decreased mobility, decreased ROM, increased fascial restrictions, increased muscle spasms, impaired UE functional use, postural dysfunction, and pain.   ACTIVITY LIMITATIONS: carrying, lifting, bending, bed mobility, and reach over head  PERSONAL FACTORS: Age, Fitness, Past/current experiences, and 1 comorbidity: lymphedema are also affecting patient's functional outcome.   REHAB POTENTIAL: Good  CLINICAL DECISION MAKING: Stable/uncomplicated  EVALUATION COMPLEXITY: Low   GOALS: Goals reviewed with patient? No   SHORT TERM GOALS=LONG TERM GOALS: Target date: 02/19/2024  Patient to demonstrate independence in advanced HEP  Baseline: [id number] Goal status: ongoing 12/25/2023  2.  Patient will acknowledge 4/10 pain at least once during episode of care   Baseline: 8/10 Goal status: MET 12/25/2023 (2/10)  3.  Patient will score at least 20/50 on NDI to signify clinically meaningful improvement in functional abilities.   Baseline: 28/50 Goal status:  met 9/30 (however not met 12/25/2023  4.  160d AROM in B  shoulder flexion/abduction Baseline: 150d Goal status: ongoing  5.  Improved cervical rotation ROM to 45 degrees needed for driving revised Baseline:  Goal status: MET 12/25/2023  6. Patient will be able to walk at [facility] due to improved cardiovascular endurance and improved LE strength. Baseline:  Goal status: NEW (updated)  7. PSFS rating for computer work improved to 6-7  Baseline:  Goal status: MET (6) 12/25/2023  8. PSFS rating for cooking improved to 8 and able to cook 1 hour due to improved muscular endurance and decreased pain.  Baseline: 6 Goal status:NEW (updated )  9. Patient will demonstrate correct squatting, hinging, and lifting technique with no cues to prevent mechanical strain and injury.   Baseline: 6 Goal status:NEW (updated )   10. Patient will perform 5STS in < or = to 13 sec due to improved LE strength and functional mobility.  Baseline: 17.31 sec Goal status:NEW    PLAN:  PT FREQUENCY: 1-2x/week  PT DURATION: 8 weeks   PLANNED INTERVENTIONS: [id number]- PT Re-evaluation, 97110-Therapeutic exercises, 97530- Therapeutic activity,  02887- Neuromuscular re-education, [phone]- Self Care, 02859- Manual therapy, [phone]- Gait training, [phone]- Canalith repositioning, [id number]- Aquatic Therapy, [phone]- Electrical stimulation (unattended), [phone]- Electrical stimulation (manual), [id number]- Vasopneumatic device, [id number]- Traction (mechanical), [id number]- Ionotophoresis 4mg /ml Dexamethasone , 79439 (1-2 muscles), 20561 (3+ muscles)- Dry Needling, Stair training, Taping, Joint mobilization, Joint manipulation, Spinal manipulation, Spinal mobilization, Vestibular training, Cryotherapy, and Moist heat  PLAN FOR NEXT SESSION aquatic PT for cervical and lumbar; core strengthening postural strengthening, shoulder ROM, manual therapy  Ronal Foots) Liany Mumpower MPT 02/07/24 9:37 AM [facility] [address] [place], [place],  [address] Phone: [phone]   Fax:  [phone]   "

## 2024-02-09 ENCOUNTER — Ambulatory Visit: Attending: Orthopedic Surgery | Admitting: Physical Therapy

## 2024-02-09 ENCOUNTER — Encounter: Payer: Self-pay | Admitting: Physical Therapy

## 2024-02-09 DIAGNOSIS — M6281 Muscle weakness (generalized): Secondary | ICD-10-CM

## 2024-02-09 DIAGNOSIS — M5459 Other low back pain: Secondary | ICD-10-CM

## 2024-02-09 DIAGNOSIS — M542 Cervicalgia: Secondary | ICD-10-CM

## 2024-02-09 NOTE — Therapy (Signed)
 " OUTPATIENT PHYSICAL THERAPY CERVICAL & LUMBAR TREATMENT   Patient Name: Karen [name] MRN: [id number] DOB:07-16-1970, 54 y.o., female Today's Date: 02/09/2024  END OF SESSION:  PT End of Session - 02/09/24 0804     Visit Number 31    Date for Recertification  02/19/24    Authorization Type [employer]    Authorization Time Period signed dry needling consent form 08/08/23    PT Start Time 0805    PT Stop Time 0843    PT Time Calculation (min) 38 min    Activity Tolerance Patient tolerated treatment well               Past Medical History:  Diagnosis Date   Adenomyosis    Anemia    Arthritis 2013   L knee gets steroid injections    Arthritis, rheumatic, acute or subacute    Asthmatic bronchitis 01/31/2017   Back pain    Chronic constipation    Diarrhea    DVT of lower extremity (deep venous thrombosis) (HCC)    Dysmenorrhea    Endometriosis    Esophageal stricture    G6PD deficiency    GERD (gastroesophageal reflux disease)    History of kidney stones    History of uterine fibroid    IBS (irritable bowel syndrome)    Interstitial cystitis    Lactose intolerance    Migraine    with aura   Other hemochromatosis 06/10/2021   PONV (postoperative nausea and vomiting)    Recurrent upper respiratory infection (URI)    Sebaceous cyst of breast, right lower inner quadrant 10/25/2012   Excised 11/21/12    Sleep apnea    Syncope, non cardiac    Past Surgical History:  Procedure Laterality Date   ARTHROSCOPIC HAGLUNDS REPAIR     BREAST CYST EXCISION Right 11/21/2012   Procedure: CYST EXCISION BREAST;  Surgeon: Sherlean JINNY Laughter, MD;  Location: [facility];  Service: General;  Laterality: Right;   BREAST CYST EXCISION Bilateral 01/18/2022   Procedure: EXCISION OF SEBACEOUS CYST BILATERAL BREAST;  Surgeon: Vanderbilt Ned, MD;  Location: [facility];  Service: General;  Laterality: Bilateral;   BREAST EXCISIONAL BIOPSY Right 11/2012   CESAREAN  SECTION  04/19/1993   CHOLECYSTECTOMY     COLONOSCOPY  05/02/2011   Procedure: COLONOSCOPY;  Surgeon: Lynwood LITTIE Celestia Mickey., MD;  Location: [facility];  Service: Endoscopy;  Laterality: N/A;   colonoscopy  11/04/2014   DENTAL SURGERY  03/06/2018   2 surgeries ( 03/06/2018 and 04/06/2018)   to remove two separate benign tumors   ESOPHAGEAL MANOMETRY N/A 11/04/2015   Procedure: ESOPHAGEAL MANOMETRY (EM);  Surgeon: Lynwood Celestia, MD;  Location: [facility];  Service: Endoscopy;  Laterality: N/A;   ESOPHAGOGASTRODUODENOSCOPY  05/02/2011   Procedure: ESOPHAGOGASTRODUODENOSCOPY (EGD);  Surgeon: Lynwood LITTIE Celestia Mickey., MD;  Location: [facility];  Service: Endoscopy;  Laterality: N/A;   ESOPHAGOGASTRODUODENOSCOPY N/A 09/01/2014   Procedure: ESOPHAGOGASTRODUODENOSCOPY (EGD);  Surgeon: Lynwood Celestia, MD;  Location: [facility];  Service: Endoscopy;  Laterality: N/A;   IR CV LINE INJECTION  09/13/2023   IR CV LINE INJECTION  12/01/2023   IR IMAGING GUIDED PORT INSERTION  07/06/2023   IR IMAGING GUIDED PORT INSERTION  12/01/2023   IR REMOVAL TUN ACCESS W/ PORT W/O FL MOD SED  12/01/2023   KNEE SURGERY     LAPAROSCOPY     x 4   LESION REMOVAL N/A 01/18/2022   Procedure: EXCISION OF SEBACEOUS CYSTS CHEST AND  NECK;  Surgeon: Vanderbilt Ned, MD;  Location: [facility];  Service: General;  Laterality: N/A;   PH IMPEDANCE STUDY N/A 11/04/2015   Procedure: PH IMPEDANCE STUDY;  Surgeon: Lynwood Bohr, MD;  Location: [facility];  Service: Endoscopy;  Laterality: N/A;   VAGINAL HYSTERECTOMY  2010   TVH--ovaries remain   Patient Active Problem List   Diagnosis Date Noted   Sjogren syndrome with keratoconjunctivitis 12/09/2022   Hemochromatosis 06/10/2021   Elevated LFTs 04/04/2021   Colitis 04/04/2021   Bacterial overgrowth syndrome 05/21/2020   Obesity 05/21/2020   History of endometriosis 05/21/2020   Constipation due to outlet dysfunction 02/05/2020   Gastroesophageal reflux disease  02/05/2020   Obstructive sleep apnea treated with continuous positive airway pressure (CPAP) 06/16/2017   Perennial allergic rhinitis with a nonallergic component 01/31/2017   Asthmatic bronchitis 01/31/2017   GI symptoms 01/31/2017   Family history of colon cancer 01/27/2015   Chest pain 12/13/2012   Dyspnea 12/13/2012   DVT of lower extremity (deep venous thrombosis) (HCC) 01/03/1990    PCP: Elliot Charm, MD   REFERRING PROVIDER:   Duwayne Purchase, MD    REFERRING DIAG:  Diagnosis  M50.30 (ICD-10-CM) - Other cervical disc degeneration, unspecified cervical region  Aqua therapy Evaluate and treat cervical and lumbar spine 2-3wk and 3-4 weeks DDD (cervical/lumbar)   THERAPY DIAG:  Muscle weakness (generalized)  Other low back pain  Cervicalgia  Rationale for Evaluation and Treatment: Rehabilitation  ONSET DATE: July 2025  SUBJECTIVE:                                                                                                                                                                                                         SUBJECTIVE STATEMENT: My back has been more sore this week bc I've been doing more.  My mother in law slipped on ice and broke her wrist.   Hemoglobin low see hematologist soon     Eval: Reports onset of L UT pain following port placement into R shoulder Hand dominance: Right  PERTINENT HISTORY:  07/21/2023 Assessment: - suspect left-sided cervical/ trapezius muscle spasm - reassured by absence of red flag signs/ symptoms at this time   Concurrent lymphedema PAIN:  Are you having pain? Yes: NPRS scale: 3/10 Pain location: LBP right > left Pain description: ache, cramp Aggravating factors: activity Relieving factors: heat, medication  PRECAUTIONS: None  RED FLAGS: None     WEIGHT BEARING RESTRICTIONS: No  FALLS:  Has patient fallen in last 6 months? No  OCCUPATION: [name] Employee  PLOF:  Independent  PATIENT GOALS: To manage my neck pain  NEXT MD VISIT: TBD  OBJECTIVE:  Note: Objective measures were completed at Evaluation unless otherwise noted.  DIAGNOSTIC FINDINGS:  Oct 2025 C3-4 mod bulging disc osteophyte C4-5 mod to severe bulging disc osteophyte C5-6 severe bulging osteophyte C6-7 minimal retrolisthesis of c6 and C7 with severe bulging disc T2-97mod left disc protrusion  subarticular  12/2 MRI lumbar & cervical  MRI cervical spine demonstrates C6-C7 moderately severe left and moderate right foraminal stenosis. Endplate Modic changes are noted. T2-T3 partially visualized moderate left subarticular disc protrusion with impingement upon and probable minimal deformity of the left lateral cord. Likely impinging on the left T3 nerve root.  X-rays of the cervical spine demonstrate cervical spondylosis predominantly at C4-5 C5-6 and C6-7. No subluxation at C1-C2.   X-rays lumbar spine demonstrates a mild dextrorotatory scoliosis. There is disc degeneration L5-S1 remainder obscured by barium.   MRI lumbar spine degenerative spinal listhesis L4-5 with severe facet arthrosis bulging disc mild stenosis. Mild degenerative changes small disc at L5-S1 and L3-4.    PATIENT SURVEYS:  NDI: 28/50 56% perceived disability  Minimum Detectable Change (90% confidence): 5 points or 10% points  10/03/23:  NDI  30%   The Patient-Specific Functional Scale  Initial:  I am going to ask you to identify up to 3 important activities that you are unable to do or are having difficulty with as a result of this problem.  Today are there any activities that you are unable to do or having difficulty with because of this?  (Patient shown scale and patient rated each activity)  Follow up: When you first came in you had difficulty performing these activities.  Today do you still have difficulty?  Patient-Specific activity scoring scheme (Point to one number):  0 1 2 3 4 5 6 7 8 9  10 Unable                                                                                                           Able to perform To perform                                                                                                    activity at the same Activity         Level as before  Injury or problem  Activity       Computer work 1 hour                                     9/30: 5                     11/12  5       12/22:6  2.          Cooking                                                           9/30: 5                  12/22:  6                                                                              3.         Sit and watch TV                                              9/30: 5          12/22:6                                                              12/25/2023 ODI:15/50 30% NDI:21/50 42%  POSTURE: rounded shoulders and forward head  PALPATION: Global tenderness throughout B UT and posterior shoulder girdle   CERVICAL ROM:   Active ROM A/PROM (deg) eval 9/30 11/12 12/22  Flexion 75%P! 47 50 30*  Extension 75%P! 45 46 32*  Right lateral flexion 75%P! 36 43 30  Left lateral flexion 75%P! 29 31 30   Right rotation 75%P! 35 40 60  Left rotation 75%P! 29 22 50   (Blank rows = not tested)  LUMBAR ROM: *painful  Active ROM 12/22  Flexion Bend to shins*  Extension 80% limited*  Right lateral flexion Bends to above knee joint*  Left lateral flexion Bends past knee joint*  Right rotation Full*  Left rotation Full*   (Blank rows = not tested)   UPPER EXTREMITY ROM:  Active ROM Right eval Left eval 9/30 11/12  Shoulder flexion 150 150 L 155  R 151   Shoulder extension      Shoulder abduction 150 150 L 167 R 161 R 150 L 155  Shoulder adduction      Shoulder extension      Shoulder internal rotation      Shoulder external rotation      Elbow  flexion  Elbow extension      Wrist flexion      Wrist extension      Wrist ulnar deviation      Wrist radial deviation      Wrist pronation      Wrist supination       (Blank rows = not tested)  UPPER EXTREMITY MMT:  MMT Right eval Left eval 9/30 11/12  Shoulder flexion      Shoulder extension      Shoulder abduction      Shoulder adduction      Shoulder extension      Shoulder internal rotation      Shoulder external rotation      Middle trapezius   Bil 4-/5 Bil 4/5  Lower trapezius   Bil 4-/5 Bil 4/5  Elbow flexion      Elbow extension      Wrist flexion      Wrist extension      Wrist ulnar deviation      Wrist radial deviation      Wrist pronation      Wrist supination      Grip strength       (Blank rows = not tested)  LOWER  EXTREMITY MMT: Mu:Hmnddob 4-/4+  Lt: hip flexion & knee extension 4-; all other motions grossly 4/5  CERVICAL SPECIAL TESTS:  Neck flexor muscle endurance test: Negative 9/30 decreased cervical extensor strength 4-/5 11/12: deep cervical flexors and exensors 4/5  FUNCTIONAL TESTS:  5STS:17.31 sec hands on knees (uncomfortable, weakness on Lt )   TREATMENT  02/09/2024: Review of current status  Review of hip hinge for bending Wall spine flexion/extension 10x on forearms Quadruped on elbows cat cow (Added to HEP- see below) Quadruped on elbows childs pose (Added to HEP- see below) Standing core: static hold red band with handles with marching 10x Standing core: bil UE pulsed extensions with red band with handles 10x Standing core: light, small ball chops 10x each side and pass around the waist 6x Discussed plan for PT to continue aquatic therapy to establish a program for back and neck mobility and core strength Manual:  prone over 1 pillow soft tissue mobilization to lumbar musculature; sidelying pelvic distraction for QL myofascial release; sidelying flexion/rotation mobilization grade 3 5x 20 sec each   DATE: 02/07/24 Pt seen  for aquatic therapy today.  Treatment took place in water 3.5-4.75 ft in depth at the [facility] pool. Temp of water was 91.  Pt entered/exited the pool via stairs using alternating pattern with hand rail.    *walking forward, back and side stepping in 3.6- 4.0 ft with unsupported *side stepping ue add/abd RBHB. VC and demonstration for abd bracing with movement *[employer] *TrA engagement 1/2 noodle pull down wide stance then staggered x 10.  - squatted rest period *L stretch CGA and demonstration *self massage with ball against wall lumbar spine *decompression position with yellow noodle wrapped anteriorly across chest: cycling; hip add/abd; flex/ext   Pt requires the buoyancy and hydrostatic pressure of water for support, and to offload joints by unweighting joint load by at least 50 % in navel deep water and by at least 75-80% in chest to neck deep water.  Viscosity of the water is needed for resistance of strengthening. Water current perturbations provides challenge to standing balance requiring increased core activation.   02/02/2024: Review of current status and response to 4 visits of aquatic PT Discussed plan for PT to continue aquatic  therapy to establish a program for back and neck mobility and core strength Manual:  grade 2 cervical distraction 3x 10; gentle upper trap lengthening right/left; suboccipital release; rotation mobs C2-4, lateral side glides C4-7 right/left grade 2/3; upper cervical flexion mob grade 2/3 10x    01/24/24:Pt seen for aquatic therapy today.  Treatment took place in water 3.5-4.75 ft in depth at the [facility] pool. Temp of water was 91.  Pt entered/exited the pool via stairs independently with bil rail. Pt requires buoyancy of water for support and to offload joints with strengthening exercises.  Seated water bench with 75% submersion Pt performed seated LE AROM exercises 20x in all planes, concurrent discussion of her status.    *75% depth water walking 8x each direction. Added arm push/pull with forward walking using rainbow floats * High knee slow march 4 lengths * UE On wall: heel/toe raises; hip abdct/ add, circumduction, and flex/ext: 20x each Bil * TrA set with 1/2 hollow blue noodle pull down to thighs 10x *decompression hang in deep end 1 min focused breath f/b 5 PF/TA holding 8 sec. Rest 1 min and repeat another set of 5 PF/TA contractions with exhale holding 8 sec.   Pt requires the buoyancy and hydrostatic pressure of water for support, and to offload joints by unweighting joint load by at least 50 % in navel deep water and by at least 75-80% in chest to neck deep water.  Viscosity of the water is needed for resistance of strengthening. Water current perturbations provides challenge to standing balance requiring increased core activation.   01/16/24 Pt seen for aquatic therapy today.  Treatment took place in water 3.5-4.75 ft in depth at the [facility] pool. Temp of water was 91.  Pt entered/exited the pool via stairs independently with bil rail. Pt requires buoyancy of water for support and to offload joints with strengthening exercises.  Seated water bench with 75% submersion Pt performed seated LE AROM exercises 20x in all planes, concurrent discussion of her status.   *75% depth water walking 6x each direction. VC for whatever UE felt good to her. * High knee slow march 4 lengths * UE On wall: heel/toe raises; hip abdct/ add, circumduction 15x eac2x5 * TrA set with 1/2 hollow blue noodle pull down to thighs  *decompression hang in deep end 1 min focused breath f/b 5 PF/TA contractions. Rest 1 min and repeat another set of 5 PF/TA contractions with exhale.   Pt requires the buoyancy and hydrostatic pressure of water for support, and to offload joints by unweighting joint load by at least 50 % in navel deep water and by at least 75-80% in chest to neck deep water.  Viscosity of the water is  needed for resistance of strengthening. Water current perturbations provides challenge to standing balance requiring increased core activation.   01/10/24:Pt arrives for aquatic physical therapy. Treatment took place in 3.5-5.5 feet of water. Water temperature was 91 degrees F. Pt entered the pool via stairs independently using rails. Seated water bench with 75% submersion Pt performed seated LE AROM exercises 20x in all planes, with concurrent discussion of current status. -75% depth water walking 4x each holding the Q-tip for back support.VC for proper heel toe walking as pt was ambulating on her toes with forward walking. -decompression hang 3 min with xtra large white noodle -standing post pelvic tilt  against the wall 3 sec hold 6x, Vc to move/tilt from abdomin -Hip abd 10x Bil -Standing marching 10x B  12/25/2023 Lumbar Assessment, NDI,ODI,PSFS (see above) Goal Assessment and plan for the next 8 weeks    12/20/2023 Patient reports she has been implementing hip hinge with function Plan to do ERO next visit (pt has new referral from Dr. Duwayne to include Low back pain and initiation of aquatic PT) HEP update with areas of focus Cervical ROM in all planes 3-5 reps each (Added to HEP- see below) Shoulder supine flexion 3-5 reps (Added to HEP- see below) Seated or standing shoulder abduction 3-5 reps (Added to HEP- see below) Manual:  grade 2 cervical distraction 3x 10; gentle upper trap lengthening right/left; suboccipital release; rotation mobs C2-4, lateral side glides C4-7 right/left grade 2/3; upper cervical flexion mob grade 2/3 10x  12/13/2023 Discussion of status including new port an allergic reaction Plan to do ERO 12/23 (pt has new referral from Dr. Duwayne to include Low back pain and initiation of aquatic PT) Body mechanics with standing: foot prop  Hip hinge with dowel and 3 points of contact and neutral spine alignment10x Hip hinge for functional movement to retrieve  pots/pans and pick up objects from the floor Manual:  grade 2 cervical distraction 3x 10; gentle upper trap lengthening right/left; suboccipital release; rotation mobs C2-4, lateral side glides C4-7 right/left grade 2/3; upper cervical flexion mob grade 2/3 10x   PATIENT EDUCATION:  Education details: Discussed eval findings, rehab rationale and POC and patient is in agreement  Person educated: Patient Education method: Explanation and Handouts Education comprehension: verbalized understanding and needs further education  HOME EXERCISE PROGRAM: Access Code: [email or web address] URL: [email or web address] Date: 12/20/2023 Prepared by: Glade Pesa  Exercises - Seated Shoulder Shrugs  - 2 x daily - 5 x weekly - 2 sets - 10 reps - Seated Scapular Retraction  - 2 x daily - 5 x weekly - 2 sets - 10 reps - Gentle Levator Scapulae Stretch  - 1 x daily - 7 x weekly - 1 sets - 2 reps - 30 seconds hold - Seated Cervical Sidebending Stretch  - 1 x daily - 7 x weekly - 1 sets - 2 reps - 30 seconds hold - Quadricep Stretch with Chair and Counter Support  - 1 x daily - 7 x weekly - 2 sets - 20-30s hold - Standing Hamstring Stretch with Step  - 1 x daily - 7 x weekly - 2 sets - 20-30s hold - Seated Hamstring Stretch  - 1 x daily - 7 x weekly - 2 sets - 20-30 hold - Seated Hip Flexor Stretch  - 1 x daily - 7 x weekly - 2 sets - 20-30 hold - Seated Scalene Stretch with Towel  - 1 x daily - 7 x weekly - 2 sets - 20 reps - Seated Modified Small Range Crunch in Chair  - 1 x daily - 7 x weekly - 1-2 sets - 10 reps - Standing Marching  - 1 x daily - 7 x weekly - 1 sets - 10 reps - Median Nerve Flossing - Tray  - 1 x daily - 7 x weekly - 1 sets - 10 reps - Seated Cervical Traction  - 1 x daily - 7 x weekly - 1 sets - 10 reps - Seated Neck and Traction  - 1 x daily - 7 x weekly - 1 sets - 10 reps - Standing Low Shoulder Row with Anchored Resistance  - 1 x daily - 7 x weekly - 1 sets - 10 reps - Seated  Thoracic Flexion and Rotation  with Arms Crossed  - 1 x daily - 7 x weekly - 1 sets - 5 reps - Seated Thoracic Lumbar Extension  - 1 x daily - 7 x weekly - 1 sets - 5-10 reps - Supine Head Nod with Deep Neck Flexor Activation  - 1 x daily - 7 x weekly - 1 sets - 10 reps - Seated Deep Neck Flexor Nods  - 1 x daily - 7 x weekly - 1 sets - 10 reps - Standing Lumbar Extension at Wall - Forearms  - 1 x daily - 7 x weekly - 1 sets - 5-8 reps - 2s hold - Lying Prone  - 1 x daily - 7 x weekly - 3-41m hold - Lying Prone with 1 Pillow  - 1 x daily - 7 x weekly - 3-73m hold - Prone Press Up On Elbows  - 1 x daily - 7 x weekly - 1 sets - 5-8 reps - 3-5s hold - Seated Cervical Rotation AROM  - 1 x daily - 7 x weekly - 1 sets - 3-5 reps - Seated Cervical Flexion AROM  - 1 x daily - 7 x weekly - 1 sets - 3-5 reps - Seated Cervical Sidebending AROM  - 1 x daily - 7 x weekly - 1 sets - 3-5 reps - Seated Cervical Extension AROM  - 1 x daily - 7 x weekly - 1 sets - 3-5 reps - Supine Shoulder Flexion AAROM with Hands Clasped  - 1 x daily - 7 x weekly - 1 sets - 3-5 reps - Seated Shoulder Abduction and External Rotation AROM  - 1 x daily - 7 x weekly - 1 sets - 3-5 reps ASSESSMENT:  CLINICAL IMPRESSION: Exercises modified from standard positions (quadruped on elbows instead of on hands for wrist protection). Minimal cues for transverse abdominus activation and avoidance of knee hyperextension in standing.  Good response to manual therapy of the lumbar spine with improved QL length following.     OBJECTIVE IMPAIRMENTS: decreased activity tolerance, decreased mobility, decreased ROM, increased fascial restrictions, increased muscle spasms, impaired UE functional use, postural dysfunction, and pain.   ACTIVITY LIMITATIONS: carrying, lifting, bending, bed mobility, and reach over head  PERSONAL FACTORS: Age, Fitness, Past/current experiences, and 1 comorbidity: lymphedema are also affecting patient's functional outcome.    REHAB POTENTIAL: Good  CLINICAL DECISION MAKING: Stable/uncomplicated  EVALUATION COMPLEXITY: Low   GOALS: Goals reviewed with patient? No   SHORT TERM GOALS=LONG TERM GOALS: Target date: 02/19/2024  Patient to demonstrate independence in advanced HEP  Baseline: [id number] Goal status: ongoing 12/25/2023  2.  Patient will acknowledge 4/10 pain at least once during episode of care   Baseline: 8/10 Goal status: MET 12/25/2023 (2/10)  3.  Patient will score at least 20/50 on NDI to signify clinically meaningful improvement in functional abilities.   Baseline: 28/50 Goal status:  met 9/30 (however not met 12/25/2023  4.  160d AROM in B shoulder flexion/abduction Baseline: 150d Goal status: ongoing  5.  Improved cervical rotation ROM to 45 degrees needed for driving revised Baseline:  Goal status: MET 12/25/2023  6. Patient will be able to walk at [facility] due to improved cardiovascular endurance and improved LE strength. Baseline:  Goal status: NEW (updated)  7. PSFS rating for computer work improved to 6-7  Baseline:  Goal status: MET (6) 12/25/2023  8. PSFS rating for cooking improved to 8 and able to cook 1 hour due to improved muscular endurance and  decreased pain.  Baseline: 6 Goal status:NEW (updated )  9. Patient will demonstrate correct squatting, hinging, and lifting technique with no cues to prevent mechanical strain and injury.   Baseline: 6 Goal status:NEW (updated )   10. Patient will perform 5STS in < or = to 13 sec due to improved LE strength and functional mobility.  Baseline: 17.31 sec Goal status:NEW    PLAN:  PT FREQUENCY: 1-2x/week  PT DURATION: 8 weeks   PLANNED INTERVENTIONS: [id number]- PT Re-evaluation, 97110-Therapeutic exercises, 97530- Therapeutic activity, 97112- Neuromuscular re-education, 97535- Self Care, [id number]- Manual therapy, [phone]- Gait training, [phone]- Canalith repositioning, [id number]- Aquatic Therapy, [phone]-  Electrical stimulation (unattended), [phone]- Electrical stimulation (manual), [id number]- Vasopneumatic device, [id number]- Traction (mechanical), [id number]- Ionotophoresis 4mg /ml Dexamethasone , 79439 (1-2 muscles), [id number] (3+ muscles)- Dry Needling, Stair training, Taping, Joint mobilization, Joint manipulation, Spinal manipulation, Spinal mobilization, Vestibular training, Cryotherapy, and Moist heat  PLAN FOR NEXT SESSION ERO in 1 week; may benefit from additional 5 visits of aquatic PT for cervical and lumbar independent ex program establishment; tapered schedule; core strengthening postural strengthening,  manual therapy  Glade Pesa, PT 02/09/24 8:51 AM Phone: [phone] Fax: [phone]     "

## 2024-02-12 ENCOUNTER — Ambulatory Visit: Admitting: Dermatology

## 2024-02-12 ENCOUNTER — Ambulatory Visit: Attending: Surgery | Admitting: Occupational Therapy

## 2024-02-14 ENCOUNTER — Inpatient Hospital Stay

## 2024-02-14 ENCOUNTER — Inpatient Hospital Stay: Admitting: Medical Oncology

## 2024-02-15 ENCOUNTER — Ambulatory Visit: Admitting: Physical Therapy

## 2024-02-19 ENCOUNTER — Ambulatory Visit: Admitting: Occupational Therapy

## 2024-03-01 ENCOUNTER — Ambulatory Visit (HOSPITAL_COMMUNITY)

## 2024-03-04 ENCOUNTER — Encounter

## 2024-03-11 ENCOUNTER — Encounter: Admitting: Family

## 2024-03-13 ENCOUNTER — Encounter: Admitting: Family

## 2024-03-15 ENCOUNTER — Encounter: Admitting: Allergy

## 2024-04-08 ENCOUNTER — Ambulatory Visit (HOSPITAL_BASED_OUTPATIENT_CLINIC_OR_DEPARTMENT_OTHER): Admitting: Obstetrics & Gynecology

## 2024-05-13 ENCOUNTER — Ambulatory Visit: Admitting: Adult Health

## 2024-06-04 ENCOUNTER — Ambulatory Visit: Admitting: Allergy & Immunology
